# Patient Record
Sex: Female | Born: 1960
Health system: Southern US, Community
[De-identification: ages and names within clinical notes are randomized; demographics above are authoritative.]

## PROBLEM LIST (undated history)

## (undated) DIAGNOSIS — F22 Delusional disorders: Secondary | ICD-10-CM

## (undated) DIAGNOSIS — J42 Unspecified chronic bronchitis: Secondary | ICD-10-CM

## (undated) DIAGNOSIS — G4733 Obstructive sleep apnea (adult) (pediatric): Secondary | ICD-10-CM

## (undated) DIAGNOSIS — Z973 Presence of spectacles and contact lenses: Secondary | ICD-10-CM

## (undated) DIAGNOSIS — F419 Anxiety disorder, unspecified: Secondary | ICD-10-CM

## (undated) DIAGNOSIS — F319 Bipolar disorder, unspecified: Secondary | ICD-10-CM

## (undated) DIAGNOSIS — J189 Pneumonia, unspecified organism: Secondary | ICD-10-CM

## (undated) DIAGNOSIS — J9601 Acute respiratory failure with hypoxia: Secondary | ICD-10-CM

## (undated) DIAGNOSIS — Z972 Presence of dental prosthetic device (complete) (partial): Secondary | ICD-10-CM

## (undated) DIAGNOSIS — J439 Emphysema, unspecified: Secondary | ICD-10-CM

## (undated) DIAGNOSIS — H269 Unspecified cataract: Secondary | ICD-10-CM

## (undated) DIAGNOSIS — K219 Gastro-esophageal reflux disease without esophagitis: Secondary | ICD-10-CM

## (undated) DIAGNOSIS — J449 Chronic obstructive pulmonary disease, unspecified: Secondary | ICD-10-CM

## (undated) DIAGNOSIS — K436 Other and unspecified ventral hernia with obstruction, without gangrene: Secondary | ICD-10-CM

## (undated) DIAGNOSIS — Z6372 Alcoholism and drug addiction in family: Secondary | ICD-10-CM

## (undated) DIAGNOSIS — E119 Type 2 diabetes mellitus without complications: Secondary | ICD-10-CM

## (undated) DIAGNOSIS — F329 Major depressive disorder, single episode, unspecified: Secondary | ICD-10-CM

## (undated) DIAGNOSIS — M1990 Unspecified osteoarthritis, unspecified site: Secondary | ICD-10-CM

## (undated) DIAGNOSIS — J45909 Unspecified asthma, uncomplicated: Secondary | ICD-10-CM

## (undated) DIAGNOSIS — E039 Hypothyroidism, unspecified: Secondary | ICD-10-CM

## (undated) DIAGNOSIS — M545 Low back pain, unspecified: Secondary | ICD-10-CM

## (undated) DIAGNOSIS — E079 Disorder of thyroid, unspecified: Secondary | ICD-10-CM

## (undated) DIAGNOSIS — K37 Unspecified appendicitis: Secondary | ICD-10-CM

## (undated) DIAGNOSIS — R519 Headache, unspecified: Secondary | ICD-10-CM

## (undated) DIAGNOSIS — B1920 Unspecified viral hepatitis C without hepatic coma: Secondary | ICD-10-CM

## (undated) DIAGNOSIS — R51 Headache: Secondary | ICD-10-CM

## (undated) DIAGNOSIS — E7800 Pure hypercholesterolemia, unspecified: Secondary | ICD-10-CM

## (undated) DIAGNOSIS — Z9989 Dependence on other enabling machines and devices: Secondary | ICD-10-CM

## (undated) DIAGNOSIS — R000 Tachycardia, unspecified: Secondary | ICD-10-CM

## (undated) DIAGNOSIS — F32A Depression, unspecified: Secondary | ICD-10-CM

## (undated) DIAGNOSIS — G8929 Other chronic pain: Secondary | ICD-10-CM

## (undated) DIAGNOSIS — Z9981 Dependence on supplemental oxygen: Secondary | ICD-10-CM

## (undated) DIAGNOSIS — M79606 Pain in leg, unspecified: Secondary | ICD-10-CM

## (undated) DIAGNOSIS — E1140 Type 2 diabetes mellitus with diabetic neuropathy, unspecified: Secondary | ICD-10-CM

## (undated) DIAGNOSIS — I10 Essential (primary) hypertension: Secondary | ICD-10-CM

## (undated) DIAGNOSIS — R7611 Nonspecific reaction to tuberculin skin test without active tuberculosis: Secondary | ICD-10-CM

## (undated) HISTORY — PX: UPPER GI ENDOSCOPY: SHX6162

## (undated) HISTORY — DX: Alcoholism and drug addiction in family: Z63.72

## (undated) HISTORY — PX: POSTERIOR LUMBAR FUSION: SHX6036

## (undated) HISTORY — PX: TUBAL LIGATION: SHX77

## (undated) HISTORY — PX: DILATION AND CURETTAGE OF UTERUS: SHX78

## (undated) HISTORY — PX: COLONOSCOPY: SHX174

## (undated) HISTORY — PX: APPENDECTOMY: SHX54

## (undated) HISTORY — PX: NASAL SEPTUM SURGERY: SHX37

## (undated) HISTORY — DX: Essential (primary) hypertension: I10

## (undated) HISTORY — PX: LACERATION REPAIR: SHX5168

## (undated) HISTORY — PX: HERNIA REPAIR: SHX51

## (undated) HISTORY — PX: BACK SURGERY: SHX140

## (undated) HISTORY — PX: FOOT SURGERY: SHX648

## (undated) HISTORY — PX: UMBILICAL HERNIA REPAIR: SHX196

## (undated) HISTORY — PX: CYST EXCISION: SHX5701

## (undated) HISTORY — PX: CARDIAC CATHETERIZATION: SHX172

## (undated) HISTORY — PX: LIPOMA EXCISION: SHX5283

## (undated) HISTORY — PX: SINOSCOPY: SHX187

## (undated) HISTORY — PX: INCISION AND DRAINAGE: SHX5863

---

## 2013-07-27 ENCOUNTER — Emergency Department (INDEPENDENT_AMBULATORY_CARE_PROVIDER_SITE_OTHER)
Admission: EM | Admit: 2013-07-27 | Discharge: 2013-07-27 | Disposition: A | Discharge status: Home / Self Care | Payer: Medicare Other | Source: Home / Self Care

## 2013-07-27 ENCOUNTER — Encounter (HOSPITAL_COMMUNITY): Payer: Self-pay | Admitting: Emergency Medicine

## 2013-07-27 DIAGNOSIS — L578 Other skin changes due to chronic exposure to nonionizing radiation: Secondary | ICD-10-CM

## 2013-07-27 DIAGNOSIS — T148XXA Other injury of unspecified body region, initial encounter: Secondary | ICD-10-CM

## 2013-07-27 HISTORY — DX: Unspecified viral hepatitis C without hepatic coma: B19.20

## 2013-07-27 HISTORY — DX: Disorder of thyroid, unspecified: E07.9

## 2013-07-27 HISTORY — DX: Chronic obstructive pulmonary disease, unspecified: J44.9

## 2013-07-27 HISTORY — DX: Unspecified osteoarthritis, unspecified site: M19.90

## 2013-07-27 HISTORY — DX: Type 2 diabetes mellitus without complications: E11.9

## 2013-07-27 MED ORDER — SULFAMETHOXAZOLE-TMP DS 800-160 MG PO TABS
1.0000 | ORAL_TABLET | Freq: Two times a day (BID) | ORAL | Status: DC
Start: 2013-07-27 — End: 2016-09-24

## 2013-07-27 MED ORDER — MUPIROCIN 2 % EX OINT: 1.0000 "application " | TOPICAL_OINTMENT | Freq: Two times a day (BID) | CUTANEOUS | Status: DC

## 2013-07-27 NOTE — ED Notes (Signed)
Reports blister on top of right foot since 5/31.  States gradually getting bigger.  Hx DM.  Denies pain

## 2013-07-27 NOTE — ED Provider Notes (Signed)
Medical screening examination/treatment/procedure(s) were performed by a resident physician and as supervising physician I was immediately available for consultation/collaboration.  Philipp Deputy, M.D.  Harden Mo, MD 07/27/13 2217

## 2013-07-27 NOTE — Discharge Instructions (Signed)
The blister on your foot is likely from sun damage This will pop with time but let nature do this, as the blister acts as a protective layer to the skin underneath Please use the mupirocin ointment when it pops until a scab forms.  Please only use the bactrim if you develop a secondary deep skin infection.  Try to avoid over exposure to the sun
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
## 2013-07-31 ENCOUNTER — Encounter (HOSPITAL_COMMUNITY): Payer: Self-pay | Admitting: Emergency Medicine

## 2013-07-31 ENCOUNTER — Emergency Department (HOSPITAL_COMMUNITY)
Admission: EM | Admit: 2013-07-31 | Discharge: 2013-08-01 | Disposition: A | Discharge status: Home / Self Care | Payer: Medicare Other | Attending: Emergency Medicine | Admitting: Emergency Medicine

## 2013-07-31 DIAGNOSIS — J4489 Other specified chronic obstructive pulmonary disease: Secondary | ICD-10-CM | POA: Insufficient documentation

## 2013-07-31 DIAGNOSIS — X58XXXA Exposure to other specified factors, initial encounter: Secondary | ICD-10-CM | POA: Insufficient documentation

## 2013-07-31 DIAGNOSIS — E079 Disorder of thyroid, unspecified: Secondary | ICD-10-CM | POA: Insufficient documentation

## 2013-07-31 DIAGNOSIS — Z881 Allergy status to other antibiotic agents status: Secondary | ICD-10-CM | POA: Insufficient documentation

## 2013-07-31 DIAGNOSIS — Z792 Long term (current) use of antibiotics: Secondary | ICD-10-CM | POA: Insufficient documentation

## 2013-07-31 DIAGNOSIS — Y929 Unspecified place or not applicable: Secondary | ICD-10-CM | POA: Insufficient documentation

## 2013-07-31 DIAGNOSIS — IMO0002 Reserved for concepts with insufficient information to code with codable children: Secondary | ICD-10-CM | POA: Insufficient documentation

## 2013-07-31 DIAGNOSIS — Y939 Activity, unspecified: Secondary | ICD-10-CM | POA: Insufficient documentation

## 2013-07-31 DIAGNOSIS — M129 Arthropathy, unspecified: Secondary | ICD-10-CM | POA: Insufficient documentation

## 2013-07-31 DIAGNOSIS — J449 Chronic obstructive pulmonary disease, unspecified: Secondary | ICD-10-CM | POA: Insufficient documentation

## 2013-07-31 DIAGNOSIS — Z79899 Other long term (current) drug therapy: Secondary | ICD-10-CM | POA: Insufficient documentation

## 2013-07-31 DIAGNOSIS — F17200 Nicotine dependence, unspecified, uncomplicated: Secondary | ICD-10-CM | POA: Insufficient documentation

## 2013-07-31 DIAGNOSIS — E119 Type 2 diabetes mellitus without complications: Secondary | ICD-10-CM | POA: Insufficient documentation

## 2013-07-31 DIAGNOSIS — B1920 Unspecified viral hepatitis C without hepatic coma: Secondary | ICD-10-CM | POA: Insufficient documentation

## 2013-07-31 DIAGNOSIS — S90829A Blister (nonthermal), unspecified foot, initial encounter: Secondary | ICD-10-CM

## 2013-07-31 LAB — CBC WITH DIFFERENTIAL/PLATELET
Basophils Absolute: 0.1 10*3/uL (ref 0.0–0.1)
Basophils Relative: 1 % (ref 0–1)
Eosinophils Absolute: 0.4 10*3/uL (ref 0.0–0.7)
Eosinophils Relative: 3 % (ref 0–5)
HCT: 39.7 % (ref 36.0–46.0)
Hemoglobin: 13.6 g/dL (ref 12.0–15.0)
Lymphocytes Relative: 34 % (ref 12–46)
Lymphs Abs: 4 10*3/uL (ref 0.7–4.0)
MCH: 33.2 pg (ref 26.0–34.0)
MCHC: 34.3 g/dL (ref 30.0–36.0)
MCV: 96.8 fL (ref 78.0–100.0)
Monocytes Absolute: 0.6 10*3/uL (ref 0.1–1.0)
Monocytes Relative: 5 % (ref 3–12)
Neutro Abs: 6.7 10*3/uL (ref 1.7–7.7)
Neutrophils Relative %: 57 % (ref 43–77)
Platelets: 320 10*3/uL (ref 150–400)
RBC: 4.1 MIL/uL (ref 3.87–5.11)
RDW: 13.5 % (ref 11.5–15.5)
WBC: 11.8 10*3/uL — ABNORMAL HIGH (ref 4.0–10.5)

## 2013-07-31 LAB — URINALYSIS, ROUTINE W REFLEX MICROSCOPIC
Bilirubin Urine: NEGATIVE
Glucose, UA: NEGATIVE mg/dL
Hgb urine dipstick: NEGATIVE
Ketones, ur: NEGATIVE mg/dL
Leukocytes, UA: NEGATIVE
Nitrite: NEGATIVE
Protein, ur: NEGATIVE mg/dL
Specific Gravity, Urine: 1.006 (ref 1.005–1.030)
Urobilinogen, UA: 0.2 mg/dL (ref 0.0–1.0)
pH: 6.5 (ref 5.0–8.0)

## 2013-07-31 LAB — I-STAT CHEM 8, ED
BUN: 15 mg/dL (ref 6–23)
Calcium, Ion: 1.16 mmol/L (ref 1.12–1.23)
Chloride: 101 mEq/L (ref 96–112)
Creatinine, Ser: 1 mg/dL (ref 0.50–1.10)
Glucose, Bld: 95 mg/dL (ref 70–99)
HCT: 42 % (ref 36.0–46.0)
Hemoglobin: 14.3 g/dL (ref 12.0–15.0)
Potassium: 3.4 mEq/L — ABNORMAL LOW (ref 3.7–5.3)
Sodium: 145 mEq/L (ref 137–147)
TCO2: 27 mmol/L (ref 0–100)

## 2013-07-31 NOTE — ED Notes (Signed)
Pt has had a blister for several days on right foot that has increased in size.

## 2013-07-31 NOTE — ED Notes (Addendum)
C/o R anterior foot blister, 4.5cm x 5cm. Onset on 5/30. H/o recent dx of DM, "wants blister to be drained". (Denies: pain, fever, nvd). Seen at [facility], prescribed mupiricin & sulfa. Also increased urination.

## 2013-07-31 NOTE — ED Provider Notes (Signed)
CSN: [id number]     Arrival date & time 07/31/13  2126 History  This chart was scribed for non-physician practitioner working with No att. providers found by Mercy Moore, ED Scribe. This patient was seen in room TR10C/TR10C and the patient's care was started at 11:43 PM.   Chief Complaint  Patient presents with  . Blister     HPI HPI Comments: Joan [name] is a 53 y.o. female who presents to the Emergency Department complaining of blister. Blister is on the dorsunm of the R foot. She atates that it began 2 weeks ago and has gotten progressively worse. She has no other lesions. She has never had anything like this before. She denies burn or insect bite. Denies contacts with similar,changes in lotions/soaps/detergents, exposure to animal or plant irritants, and denies purulent discharge. 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 She denies pain, heat, redness, swelling or other signs of infections. She would like to have the blister "popped."  Past Medical History  Diagnosis Date  . COPD (chronic obstructive pulmonary disease)   . Diabetes mellitus without complication   . Hepatitis C   . Arthritis   . Thyroid disease    History reviewed. No pertinent past surgical history. Family History  Problem Relation Age of Onset  . Diabetes Mother   . Hypertension Mother   . Diabetes Father   . Hypertension Father   . Cancer Father     lung   History  Substance Use Topics  . Smoking status: Current Every Day Smoker -- 2.00 packs/day    Types: Cigarettes  . Smokeless tobacco: Not on file  . Alcohol Use: No   OB History   Grav Para Term Preterm Abortions TAB SAB Ect Mult Living                 Review of Systems  Constitutional: Negative for fever and chills.  Eyes: Negative for visual disturbance.  Respiratory: Negative for chest tightness and wheezing.   Cardiovascular: Negative for chest pain.  Gastrointestinal: Negative.   Endocrine: Negative for polydipsia,  polyphagia and polyuria.  Musculoskeletal: Negative.   Skin:       Blister   Neurological: Negative.   Psychiatric/Behavioral: Negative for self-injury.      Allergies  Keflex; Shellfish allergy; and Tetracyclines & related  Home Medications   Prior to Admission medications   Medication Sig Start Date End Date Taking? Authorizing Provider  atenolol (TENORMIN) 25 MG tablet Take by mouth daily.    Historical Provider, MD  esomeprazole (NEXIUM) 40 MG capsule Take 40 mg by mouth daily at 12 noon.    Historical Provider, MD  levothyroxine (SYNTHROID, LEVOTHROID) 25 MCG tablet Take 25 mcg by mouth daily before breakfast.    Historical Provider, MD  metFORMIN (GLUCOPHAGE) 500 MG tablet Take by mouth 2 (two) times daily with a meal.    Historical Provider, MD  mupirocin ointment (BACTROBAN) 2 % Place 1 application into the nose 2 (two) times daily. Apply for 3-7 days 07/27/13   Waldemar Dickens, MD  sulfamethoxazole-trimethoprim (BACTRIM DS) 800-160 MG per tablet Take 1 tablet by mouth 2 (two) times daily. 07/27/13   Waldemar Dickens, MD  valACYclovir (VALTREX) 500 MG tablet Take 500 mg by mouth 2 (two) times daily.    Historical Provider, MD  varenicline (CHANTIX) 1 MG tablet Take 1 mg by mouth 2 (two) times daily.    Historical Provider, MD  venlafaxine XR (EFFEXOR-XR) 150 MG 24 hr capsule  Take 150 mg by mouth daily with breakfast.    Historical Provider, MD   Triage Vitals: BP 158/81  Pulse 83  Temp(Src) 98.3 F (36.8 C) (Oral)  Resp 18  Wt 226 lb 6 oz (102.683 kg)  SpO2 95% Physical Exam  Constitutional: She is oriented to person, place, and time. She appears well-developed and well-nourished. No distress.  HENT:  Head: Normocephalic and atraumatic.  Eyes: Conjunctivae are normal. No scleral icterus.  Neck: Normal range of motion.  Cardiovascular: Normal rate, regular rhythm and normal heart sounds.  Exam reveals no gallop and no friction rub.   No murmur heard. Pulmonary/Chest:  Effort normal and breath sounds normal. No respiratory distress.  Abdominal: Soft. Bowel sounds are normal. She exhibits no distension and no mass. There is no tenderness. There is no guarding.  Neurological: She is alert and oriented to person, place, and time.  Skin: Skin is warm and dry. She is not diaphoretic.  Single 6 cm bulla on the dorsum of the R foot. No purulence, erythema, streaking or induration.     ED Course  Procedures (including critical care time) DIAGNOSTIC STUDIES: Oxygen Saturation is 95% on room air, normal by my interpretation.    COORDINATION OF CARE: 11:44 PM- Discussed treatment plan with patient at bedside and patient agreed to plan.     Labs Review Labs Reviewed  CBC WITH DIFFERENTIAL - Abnormal; Notable for the following:    WBC 11.8 (*)    All other components within normal limits  I-STAT CHEM 8, ED - Abnormal; Notable for the following:    Potassium 3.4 (*)    All other components within normal limits  URINALYSIS, ROUTINE W REFLEX MICROSCOPIC    Imaging Review No results found.   EKG Interpretation None      MDM   Final diagnoses:  Blister of foot without infection   Patient with single bulla of the foot. No new meds. No hives or airway involvement.  IF patient has diabetic bulla, there is no treatment and it should resolve on its own. Discussed that skin should be left intact to prevent infection. F/u with dermatologist in patient's home state.\ Patient / Family / Caregiver informed of clinical course, understand medical decision-making process, and agree with plan.  Denies contacts with similar,changes in lotions/soaps/detergents, exposure to animal or plant irritants, and denies purulent discharge.  I personally performed the services described in this documentation, which was scribed in my presence. The recorded information has been reviewed and is accurate.    Margarita Mail, PA-C 08/07/13 1252

## 2013-08-01 NOTE — Discharge Instructions (Signed)
Blisters Blisters are fluid-filled sacs that form within the skin. Common causes of blistering are friction, burns, and exposure to irritating chemicals. The fluid in the blister protects the underlying damaged skin. Most of the time it is not recommended that you open blisters. When a blister is opened, there is an increased chance for infection. Usually, a blister will open on its own. They then dry up and peel off within 10 days. If the blister is tense and uncomfortable (painful) the fluid may be drained. If it is drained the roof of the blister should be left intact. The draining should only be done by a medical professional under aseptic conditions. Poorly fitting shoes and boots can cause blisters by being too tight or too loose. Wearing extra socks or using tape, bandages, or pads over the blister-prone area helps prevent the problem by reducing friction. Blisters heal more slowly if you have diabetes or if you have problems with your circulation. You need to be careful about medical follow-up to prevent infection. HOME CARE INSTRUCTIONS  Protect areas where blisters have formed until the skin is healed. Use a special bandage with a hole cut in the middle around the blister. This reduces pressure and friction. When the blister breaks, trim off the loose skin and keep the area clean by washing it with soap daily. Soaking the blister or broken-open blister with diluted vinegar twice daily for 15 minutes will dry it up and speed the healing. Use 3 tablespoons of white vinegar per quart of water (45 mL white vinegar per liter of water). An antibiotic ointment and a bandage can be used to cover the area after soaking.  SEEK MEDICAL CARE IF:   You develop increased redness, pain, swelling, or drainage in the blistered area.  You develop a pus-like discharge from the blistered area, chills, or a fever. MAKE SURE YOU:   Understand these instructions.  Will watch your condition.  Will get help right  away if you are not doing well or get worse. Document Released: 03/21/2004 Document Revised: 05/06/2011 Document Reviewed: 02/17/2008 [facility] Patient Information 2014 [place], [place].

## 2013-08-05 ENCOUNTER — Emergency Department (INDEPENDENT_AMBULATORY_CARE_PROVIDER_SITE_OTHER)
Admission: EM | Admit: 2013-08-05 | Discharge: 2013-08-05 | Disposition: A | Discharge status: Home / Self Care | Payer: Medicare Other | Source: Home / Self Care | Attending: Emergency Medicine | Admitting: Emergency Medicine

## 2013-08-05 ENCOUNTER — Encounter (HOSPITAL_COMMUNITY): Payer: Self-pay | Admitting: Emergency Medicine

## 2013-08-05 DIAGNOSIS — J441 Chronic obstructive pulmonary disease with (acute) exacerbation: Secondary | ICD-10-CM

## 2013-08-05 DIAGNOSIS — T300 Burn of unspecified body region, unspecified degree: Secondary | ICD-10-CM

## 2013-08-05 MED ORDER — ALBUTEROL SULFATE (2.5 MG/3ML) 0.083% IN NEBU
2.5000 mg | INHALATION_SOLUTION | Freq: Once | RESPIRATORY_TRACT | Status: AC
  Administered 2013-08-05: 2.5 mg via RESPIRATORY_TRACT

## 2013-08-05 MED ORDER — IPRATROPIUM-ALBUTEROL 0.5-2.5 (3) MG/3ML IN SOLN
RESPIRATORY_TRACT | Status: AC
  Filled 2013-08-05: qty 3

## 2013-08-05 MED ORDER — METHYLPREDNISOLONE ACETATE 80 MG/ML IJ SUSP
80.0000 mg | Freq: Once | INTRAMUSCULAR | Status: AC
  Administered 2013-08-05: 80 mg via INTRAMUSCULAR

## 2013-08-05 MED ORDER — AZITHROMYCIN 250 MG PO TABS: 1000.0000 mg | ORAL_TABLET | Freq: Once | ORAL | Status: DC

## 2013-08-05 MED ORDER — METHYLPREDNISOLONE ACETATE 80 MG/ML IJ SUSP
INTRAMUSCULAR | Status: AC
  Filled 2013-08-05: qty 1

## 2013-08-05 MED ORDER — IPRATROPIUM-ALBUTEROL 0.5-2.5 (3) MG/3ML IN SOLN
3.0000 mL | Freq: Once | RESPIRATORY_TRACT | Status: AC
  Administered 2013-08-05: 3 mL via RESPIRATORY_TRACT

## 2013-08-05 MED ORDER — SILVER SULFADIAZINE 1 % EX CREA: 1.0000 "application " | TOPICAL_CREAM | Freq: Every day | CUTANEOUS | Status: DC

## 2013-08-05 MED ORDER — ALBUTEROL SULFATE (2.5 MG/3ML) 0.083% IN NEBU
INHALATION_SOLUTION | RESPIRATORY_TRACT | Status: AC
  Filled 2013-08-05: qty 3

## 2013-08-05 MED ORDER — CLINDAMYCIN HCL 300 MG PO CAPS: 300.0000 mg | ORAL_CAPSULE | Freq: Four times a day (QID) | ORAL | Status: DC
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
## 2013-08-05 NOTE — ED Notes (Signed)
C/o blister on dorsum or R foot onset 5/31.  Has soaked it in vinegar and water, taking antibiotic, and using antibiotic cream

## 2013-08-05 NOTE — Discharge Instructions (Signed)
Chronic Obstructive Pulmonary Disease Exacerbation  Chronic obstructive pulmonary disease (COPD) is a common lung problem. In COPD, the flow of air from the lungs is limited. COPD exacerbations are times that breathing gets worse and you need extra treatment. Without treatment they can be life threatening. If they happen often, your lungs can become more damaged. HOME CARE  Do not smoke.  Avoid tobacco smoke and other things that bother your lungs.  If given, take your antibiotic medicine as told. Finish the medicine even if you start to feel better.  Only take medicines as told by your doctor.  Drink enough fluids to keep your pee (urine) clear or pale yellow (unless your doctor has told you not to).  Use a cool mist machine (vaporizer).  If you use oxygen or a machine that turns liquid medicine into a mist (nebulizer), continue to use them as told.  Keep up with shots (vaccinations) as told by your doctor.  Exercise regularly.  Eat healthy foods.  Keep all doctor visits as told. GET HELP RIGHT AWAY IF:  You are very short of breath and it gets worse.  You have trouble talking.  You have bad chest pain.  You have blood in your spit (sputum).  You have a fever.  You keep throwing up (vomiting).  You feel weak, or you pass out (faint).  You feel confused.  You keep getting worse. MAKE SURE YOU:   Understand these instructions.  Will watch your condition.  Will get help right away if you are not doing well or get worse. Document Released: 01/31/2011 Document Revised: 12/02/2012 Document Reviewed: 10/16/2012 [facility] Patient Information 2014 [place]. Burn Care Your skin is a natural barrier to infection. It is the largest organ of your body. Burns damage this natural protection. To help prevent infection, it is very important to follow your caregiver's instructions in the care of your burn. Burns are classified as:  First degree. There is only redness of  the skin (erythema). No scarring is expected.  Second degree. There is blistering of the skin. Scarring may occur with deeper burns.  Third degree. All layers of the skin are injured, and scarring is expected. HOME CARE INSTRUCTIONS   Wash your hands well before changing your bandage.  Change your bandage as often as directed by your caregiver.  Remove the old bandage. If the bandage sticks, you may soak it off with cool, clean water.  Cleanse the burn thoroughly but gently with mild soap and water.  Pat the area dry with a clean, dry cloth.  Apply a thin layer of antibacterial cream to the burn.  Apply a clean bandage as instructed by your caregiver.  Keep the bandage as clean and dry as possible.  Elevate the affected area for the first 24 hours, then as instructed by your caregiver.  Only take over-the-counter or prescription medicines for pain, discomfort, or fever as directed by your caregiver. SEEK IMMEDIATE MEDICAL CARE IF:   You develop excessive pain.  You develop redness, tenderness, swelling, or red streaks near the burn.  The burned area develops yellowish-white fluid (pus) or a bad smell.  You have a fever. MAKE SURE YOU:   Understand these instructions.  Will watch your condition.  Will get help right away if you are not doing well or get worse. Document Released: 02/11/2005 Document Revised: 05/06/2011 Document Reviewed: 07/04/2010 [facility] Patient Information 2014 [place], [place].

## 2013-08-07 NOTE — ED Provider Notes (Signed)
Medical screening examination/treatment/procedure(s) were performed by non-physician practitioner and as supervising physician I was immediately available for consultation/collaboration.   EKG Interpretation None        Mariea Clonts, MD 08/07/13 2055

## 2016-08-09 ENCOUNTER — Encounter (HOSPITAL_COMMUNITY): Payer: Self-pay | Admitting: Emergency Medicine

## 2016-08-09 ENCOUNTER — Emergency Department (HOSPITAL_COMMUNITY)
Admission: EM | Admit: 2016-08-09 | Discharge: 2016-08-09 | Disposition: A | Discharge status: Home / Self Care | Payer: Medicare (Managed Care) | Attending: Emergency Medicine | Admitting: Emergency Medicine

## 2016-08-09 ENCOUNTER — Emergency Department (HOSPITAL_COMMUNITY): Payer: Medicare (Managed Care)

## 2016-08-09 ENCOUNTER — Ambulatory Visit (HOSPITAL_COMMUNITY): Admission: EM | Admit: 2016-08-09 | Discharge: 2016-08-09 | Payer: Medicare Other

## 2016-08-09 DIAGNOSIS — Z794 Long term (current) use of insulin: Secondary | ICD-10-CM | POA: Insufficient documentation

## 2016-08-09 DIAGNOSIS — B1920 Unspecified viral hepatitis C without hepatic coma: Secondary | ICD-10-CM | POA: Diagnosis not present

## 2016-08-09 DIAGNOSIS — R200 Anesthesia of skin: Secondary | ICD-10-CM | POA: Insufficient documentation

## 2016-08-09 DIAGNOSIS — F17210 Nicotine dependence, cigarettes, uncomplicated: Secondary | ICD-10-CM | POA: Insufficient documentation

## 2016-08-09 DIAGNOSIS — J441 Chronic obstructive pulmonary disease with (acute) exacerbation: Secondary | ICD-10-CM

## 2016-08-09 DIAGNOSIS — E119 Type 2 diabetes mellitus without complications: Secondary | ICD-10-CM | POA: Diagnosis not present

## 2016-08-09 DIAGNOSIS — Z79899 Other long term (current) drug therapy: Secondary | ICD-10-CM | POA: Insufficient documentation

## 2016-08-09 DIAGNOSIS — R0602 Shortness of breath: Secondary | ICD-10-CM | POA: Insufficient documentation

## 2016-08-09 LAB — CBC
HCT: 40.9 % (ref 36.0–46.0)
Hemoglobin: 13.6 g/dL (ref 12.0–15.0)
MCH: 32.5 pg (ref 26.0–34.0)
MCHC: 33.3 g/dL (ref 30.0–36.0)
MCV: 97.8 fL (ref 78.0–100.0)
Platelets: 329 10*3/uL (ref 150–400)
RBC: 4.18 MIL/uL (ref 3.87–5.11)
RDW: 13.7 % (ref 11.5–15.5)
WBC: 8.5 10*3/uL (ref 4.0–10.5)

## 2016-08-09 LAB — I-STAT CHEM 8, ED
BUN: 6 mg/dL (ref 6–20)
Calcium, Ion: 1.19 mmol/L (ref 1.15–1.40)
Chloride: 105 mmol/L (ref 101–111)
Creatinine, Ser: 0.9 mg/dL (ref 0.44–1.00)
Glucose, Bld: 140 mg/dL — ABNORMAL HIGH (ref 65–99)
HCT: 40 % (ref 36.0–46.0)
Hemoglobin: 13.6 g/dL (ref 12.0–15.0)
Potassium: 4.1 mmol/L (ref 3.5–5.1)
Sodium: 140 mmol/L (ref 135–145)
TCO2: 25 mmol/L (ref 0–100)

## 2016-08-09 LAB — DIFFERENTIAL
Basophils Absolute: 0 10*3/uL (ref 0.0–0.1)
Basophils Relative: 1 %
Eosinophils Absolute: 0.2 10*3/uL (ref 0.0–0.7)
Eosinophils Relative: 3 %
Lymphocytes Relative: 28 %
Lymphs Abs: 2.4 10*3/uL (ref 0.7–4.0)
Monocytes Absolute: 0.7 10*3/uL (ref 0.1–1.0)
Monocytes Relative: 8 %
Neutro Abs: 5.1 10*3/uL (ref 1.7–7.7)
Neutrophils Relative %: 60 %

## 2016-08-09 LAB — PROTIME-INR
INR: 1.1
Prothrombin Time: 14.2 seconds (ref 11.4–15.2)

## 2016-08-09 LAB — APTT: aPTT: 29 seconds (ref 24–36)

## 2016-08-09 LAB — COMPREHENSIVE METABOLIC PANEL
ALT: 19 U/L (ref 14–54)
AST: 16 U/L (ref 15–41)
Albumin: 3.6 g/dL (ref 3.5–5.0)
Alkaline Phosphatase: 107 U/L (ref 38–126)
Anion gap: 9 (ref 5–15)
BUN: 6 mg/dL (ref 6–20)
CO2: 23 mmol/L (ref 22–32)
Calcium: 9.2 mg/dL (ref 8.9–10.3)
Chloride: 106 mmol/L (ref 101–111)
Creatinine, Ser: 1.02 mg/dL — ABNORMAL HIGH (ref 0.44–1.00)
GFR calc Af Amer: >60 mL/min (ref >60)
GFR calc non Af Amer: >60 mL/min (ref >60)
Glucose, Bld: 142 mg/dL — ABNORMAL HIGH (ref 65–99)
Potassium: 4.2 mmol/L (ref 3.5–5.1)
Sodium: 138 mmol/L (ref 135–145)
Total Bilirubin: 0.6 mg/dL (ref 0.3–1.2)
Total Protein: 6.7 g/dL (ref 6.5–8.1)

## 2016-08-09 LAB — I-STAT TROPONIN, ED: Troponin i, poc: 0.01 ng/mL (ref 0.00–0.08)

## 2016-08-09 MED ORDER — IPRATROPIUM-ALBUTEROL 0.5-2.5 (3) MG/3ML IN SOLN
3.0000 mL | Freq: Once | RESPIRATORY_TRACT | Status: AC
  Administered 2016-08-09: 3 mL via RESPIRATORY_TRACT
  Filled 2016-08-09: qty 3

## 2016-08-09 MED ORDER — PREDNISONE 10 MG PO TABS: 40.0000 mg | ORAL_TABLET | Freq: Every day | ORAL | 0 refills | Status: DC

## 2016-08-09 MED ORDER — ALBUTEROL SULFATE (2.5 MG/3ML) 0.083% IN NEBU
5.0000 mg | INHALATION_SOLUTION | Freq: Once | RESPIRATORY_TRACT | Status: AC
  Administered 2016-08-09: 5 mg via RESPIRATORY_TRACT
  Filled 2016-08-09: qty 6

## 2016-08-09 MED ORDER — METHYLPREDNISOLONE SODIUM SUCC 125 MG IJ SOLR
125.0000 mg | Freq: Once | INTRAMUSCULAR | Status: AC
  Administered 2016-08-09: 125 mg via INTRAVENOUS
  Filled 2016-08-09: qty 2

## 2016-08-09 NOTE — ED Triage Notes (Signed)
Arrived in [place] 2 days ago and was seen in hospital in [place] and discharged June 11th. States continued to have symptoms of shortness of breath, left arm and left leg numbness and dizziness. States symptoms worsening overtime.  Alert answering and following commands appropriate.

## 2016-08-09 NOTE — ED Notes (Signed)
Contact # [phone] : brother - [name]

## 2016-08-09 NOTE — Discharge Instructions (Signed)
Continue to use albuterol inhaler 2 puffs every 6 hours for the next 7 days. Take prednisone short course for the next 5 days. Make an appointment to follow-up with the wellness clinic. Identifying a primary care doctor locally will be important if you decide stay in the area. Return for any new or worse symptoms.

## 2016-08-09 NOTE — ED Provider Notes (Signed)
[facility] DEPT Provider Note   CSN: [id number] Arrival date & time: 08/09/16  1107     History   Chief Complaint Chief Complaint  Patient presents with  . Shortness of Breath  . Numbness    HPI Joan [name] is a 56 y.o. female.  Patient presents with shortness of breath. Patient is known COPD. Patient apparently with a prolonged admission from May until June 11 in [place] which included some rehabilitation time. Patient was discharged June 11. Patient traveled here because of family members. Patient states that the shortness of breath has been getting worse. Also with a complaint of left arm left leg numbness. In triage complained of dizziness but didn't complain of dizziness to me. Patient oxygen saturations on arrival was 9697 but did have bilateral wheezing.      Past Medical History:  Diagnosis Date  . Arthritis   . COPD (chronic obstructive pulmonary disease) ([place])   . Diabetes mellitus without complication ([place])   . Hepatitis C   . Thyroid disease     There are no active problems to display for this patient.   Past Surgical History:  Procedure Laterality Date  . COLONOSCOPY    . FOOT SURGERY Bilateral    bone removed from 4th and 5 th toes and put in pins  . HERNIA REPAIR     umbilical  . NASAL SEPTUM SURGERY     deviated septum and removal of cyst in sinuses  . NECK SURGERY Right    fattty lipoma   . UPPER GI ENDOSCOPY    . WRIST SURGERY Right    repair artery from laceration from ice skate    OB History    No data available       Home Medications    Prior to Admission medications   Medication Sig Start Date End Date Taking? Authorizing Provider  albuterol (PROVENTIL) (2.5 MG/3ML) 0.083% nebulizer solution Take 3 mLs by nebulization every 6 (six) hours as needed for wheezing. 08/05/16  Yes [provider]  atenolol (TENORMIN) 25 MG tablet Take by mouth daily.   Yes [provider]  atorvastatin (LIPITOR) 20 MG tablet Take 20  mg by mouth daily.   Yes [provider]  baclofen (LIORESAL) 10 MG tablet Take 10 mg by mouth at bedtime.   Yes [provider]  budesonide (PULMICORT) 1 MG/2ML nebulizer solution Take 1 mg by nebulization 4 (four) times daily.   Yes [provider]  formoterol (PERFOROMIST) 20 MCG/2ML nebulizer solution Take 20 mcg by nebulization 2 (two) times daily.   Yes [provider]  Insulin Glargine (LANTUS SOLOSTAR) 100 UNIT/ML Solostar Pen Inject 18 Units into the skin daily.   Yes [provider]  insulin lispro (HUMALOG KWIKPEN) 100 UNIT/ML KiwkPen Inject 7 Units into the skin 3 (three) times daily.   Yes [provider]  levothyroxine (SYNTHROID, LEVOTHROID) 25 MCG tablet Take 25 mcg by mouth daily before breakfast.   Yes [provider]  lisinopril (PRINIVIL,ZESTRIL) 10 MG tablet Take 10 mg by mouth daily.   Yes [provider]  montelukast (SINGULAIR) 10 MG tablet Take 10 mg by mouth at bedtime.   Yes [provider]  roflumilast (DALIRESP) 500 MCG TABS tablet Take 500 mcg by mouth daily.   Yes [provider]  sertraline (ZOLOFT) 50 MG tablet Take 50 mg by mouth daily.   Yes [provider]  tiotropium (SPIRIVA) 18 MCG inhalation capsule Place 18 mcg into inhaler and inhale  daily.   Yes [provider]  valACYclovir (VALTREX) 500 MG tablet Take 500 mg by mouth daily.    Yes [provider]  venlafaxine XR (EFFEXOR-XR) 150 MG 24 hr capsule Take 150 mg by mouth daily with breakfast.   Yes [provider]  clindamycin (CLEOCIN) 300 MG capsule Take 1 capsule (300 mg total) by mouth 4 (four) times daily. Patient not taking: Reported on 08/09/2016 08/05/13   Harden Mo, MD  mupirocin ointment (BACTROBAN) 2 % Place 1 application into the nose 2 (two) times daily. Apply for 3-7 days Patient not taking: Reported on 08/09/2016 07/27/13   Waldemar Dickens, MD  predniSONE (DELTASONE)  10 MG tablet Take 4 tablets (40 mg total) by mouth daily. 08/09/16   Fredia Sorrow, MD  silver sulfADIAZINE (SILVADENE) 1 % cream Apply 1 application topically daily. Patient not taking: Reported on 08/09/2016 08/05/13   Harden Mo, MD  sulfamethoxazole-trimethoprim (BACTRIM DS) 800-160 MG per tablet Take 1 tablet by mouth 2 (two) times daily. Patient not taking: Reported on 08/09/2016 07/27/13   Waldemar Dickens, MD    Family History Family History  Problem Relation Age of Onset  . Diabetes Mother   . Hypertension Mother   . Hypertension Father   . Cancer Father        lung    Social History Social History  Substance Use Topics  . Smoking status: Current Every Day Smoker    Packs/day: 1.50    Types: Cigarettes  . Smokeless tobacco: Never Used  . Alcohol use No     Allergies   Dilaudid [hydromorphone hcl]; Iodine; Keflex [cephalexin]; Prednisone; Shellfish allergy; and Tetracyclines & related   Review of Systems Review of Systems  Constitutional: Negative for fever.  HENT: Negative for congestion.   Eyes: Negative for redness.  Respiratory: Positive for shortness of breath and wheezing.   Gastrointestinal: Negative for abdominal pain.  Genitourinary: Negative for dysuria.  Musculoskeletal: Negative for back pain.  Skin: Negative for rash.  Neurological: Positive for numbness.  Hematological: Does not bruise/bleed easily.  Psychiatric/Behavioral: Negative for confusion.     Physical Exam Updated Vital Signs BP 139/77   Pulse 95   Temp 97.9 F (36.6 C)   Resp (!) 21   Ht 1.727 m (5\' 8" )   Wt 90.7 kg (200 lb)   SpO2 95%   BMI 30.41 kg/m   Physical Exam  Constitutional: She is oriented to person, place, and time. She appears well-developed and well-nourished. No distress.  HENT:  Head: Normocephalic and atraumatic.  Mouth/Throat: Oropharynx is clear and moist.  Eyes: Conjunctivae and EOM are normal. Pupils are equal, round, and reactive to light.    Neck: Normal range of motion. Neck supple.  Cardiovascular: Normal rate, regular rhythm and normal heart sounds.   Pulmonary/Chest: Effort normal. She has wheezes.  Abdominal: Soft. Bowel sounds are normal. There is no tenderness.  Musculoskeletal: Normal range of motion.  Left radial pulse 2+.  Neurological: She is alert and oriented to person, place, and time. No cranial nerve deficit or sensory deficit. She exhibits normal muscle tone. Coordination normal.  With the exception that patient states there is numbness to the left arm.  Nursing note and vitals reviewed.    ED Treatments / Results  Labs (all labs ordered are listed, but only abnormal results are displayed) Labs Reviewed  COMPREHENSIVE METABOLIC PANEL - Abnormal; Notable for the following:       Result Value  Glucose, Bld 142 (*)    Creatinine, Ser 1.02 (*)    All other components within normal limits  I-STAT CHEM 8, ED - Abnormal; Notable for the following:    Glucose, Bld 140 (*)    All other components within normal limits  PROTIME-INR  APTT  CBC  DIFFERENTIAL  I-STAT TROPOININ, ED  CBG MONITORING, ED    EKG  EKG Interpretation  Date/Time:  Friday August 09 2016 11:17:55 EDT Ventricular Rate:  110 PR Interval:  146 QRS Duration: 88 QT Interval:  352 QTC Calculation: 476 R Axis:   84 Text Interpretation:  Sinus tachycardia Otherwise normal ECG No previous ECGs available Confirmed by Fredia Sorrow [phone]) on 08/09/2016 1:16:46 PM       Radiology Dg Chest 2 View  Result Date: 08/09/2016 CLINICAL DATA:  Cough. EXAM: CHEST  2 VIEW COMPARISON:  None. FINDINGS: The heart size and mediastinal contours are within normal limits. Both lungs are clear. No pneumothorax or pleural effusion is noted. Prominent epicardial fat pad is seen anteriorly in the left lung base. The visualized skeletal structures are unremarkable. IMPRESSION: No active cardiopulmonary disease. Electronically Signed   By: Marijo Conception,  M.D.   On: 08/09/2016 13:01   Ct Head Wo Contrast  Result Date: 08/09/2016 CLINICAL DATA:  56 year old female with left arm numbness and heaviness. EXAM: CT HEAD WITHOUT CONTRAST TECHNIQUE: Contiguous axial images were obtained from the base of the skull through the vertex without intravenous contrast. COMPARISON:  None. FINDINGS: Brain: Cerebral volume is normal. No midline shift, ventriculomegaly, mass effect, evidence of mass lesion, intracranial hemorrhage or evidence of cortically based acute infarction. Gray-white matter differentiation is within normal limits throughout the brain. Vascular: Mild Calcified atherosclerosis at the skull base. No suspicious intracranial vascular hyperdensity. Skull: Negative.  No acute osseous abnormality identified. Sinuses/Orbits: Bubbly opacity and mucosal thickening in the left maxillary sinus. Other paranasal sinuses and mastoids are well pneumatized. Other: Visualized orbits and scalp soft tissues are within normal limits. IMPRESSION: Normal noncontrast CT appearance of the brain. Electronically Signed   By: Genevie Ann M.D.   On: 08/09/2016 13:40    Procedures Procedures (including critical care time)  Medications Ordered in ED Medications  albuterol (PROVENTIL) (2.5 MG/3ML) 0.083% nebulizer solution 5 mg (5 mg Nebulization Given 08/09/16 1309)  methylPREDNISolone sodium succinate (SOLU-MEDROL) 125 mg/2 mL injection 125 mg (125 mg Intravenous Given 08/09/16 1501)  ipratropium-albuterol (DUONEB) 0.5-2.5 (3) MG/3ML nebulizer solution 3 mL (3 mLs Nebulization Given 08/09/16 1503)     Initial Impression / Assessment and Plan / ED Course  I have reviewed the triage vital signs and the nursing notes.  Pertinent labs & imaging results that were available during my care of the patient were reviewed by me and considered in my medical decision making (see chart for details).     Patient with known COPD. Patient arrived in [place] 2 days ago. Patient was  hospitalized in [place] and then rehabilitation from mild mid May until June 11. Patient apparently has family in this area. Patient with shortness of breath. Also with the complaint of left arm and left leg numbness that's been present for weeks is not new. Patient definitely with wheezing here consistent with an exacerbation of her COPD. Basic labs chest x-ray without any acute findings. Patient received 2 nebulizer treatments here and Solu-Medrol with resolution of the wheezing. Oxygen saturations have been mid 90s are upper 90s. Patient with significant improvement. Patient thinks she may be staying in  the area. Given referral to wellness clinic will be continued on prednisone for the next 5 days. Patient has an "allergy" to prednisone but it just makes her swell she is able to take it. We'll do a short burst course.  Head CT also without any acute findings. In the left arm symptoms been present for many weeks so no evidence of stroke based on head CT. Further follow-up as an outpatient would be required.   Final Clinical Impressions(s) / ED Diagnoses   Final diagnoses:  COPD exacerbation (HCC)    New Prescriptions New Prescriptions   PREDNISONE (DELTASONE) 10 MG TABLET    Take 4 tablets (40 mg total) by mouth daily.     Fredia Sorrow, MD 08/09/16 [phone]

## 2016-09-17 DIAGNOSIS — R69 Illness, unspecified: Secondary | ICD-10-CM | POA: Diagnosis not present

## 2016-09-21 DIAGNOSIS — E119 Type 2 diabetes mellitus without complications: Secondary | ICD-10-CM | POA: Diagnosis not present

## 2016-09-24 ENCOUNTER — Encounter (HOSPITAL_COMMUNITY): Payer: Self-pay | Admitting: Emergency Medicine

## 2016-09-24 ENCOUNTER — Emergency Department (HOSPITAL_COMMUNITY): Payer: Medicare HMO

## 2016-09-24 ENCOUNTER — Inpatient Hospital Stay (HOSPITAL_COMMUNITY)
Admission: EM | Admit: 2016-09-24 | Discharge: 2016-09-30 | DRG: 190 | Disposition: A | Discharge status: Home Health | Payer: Medicare HMO | Attending: Internal Medicine | Admitting: Internal Medicine

## 2016-09-24 DIAGNOSIS — G4733 Obstructive sleep apnea (adult) (pediatric): Secondary | ICD-10-CM | POA: Diagnosis present

## 2016-09-24 DIAGNOSIS — E7800 Pure hypercholesterolemia, unspecified: Secondary | ICD-10-CM | POA: Diagnosis present

## 2016-09-24 DIAGNOSIS — J9601 Acute respiratory failure with hypoxia: Secondary | ICD-10-CM | POA: Diagnosis not present

## 2016-09-24 DIAGNOSIS — F17200 Nicotine dependence, unspecified, uncomplicated: Secondary | ICD-10-CM | POA: Diagnosis present

## 2016-09-24 DIAGNOSIS — I10 Essential (primary) hypertension: Secondary | ICD-10-CM | POA: Diagnosis present

## 2016-09-24 DIAGNOSIS — G8929 Other chronic pain: Secondary | ICD-10-CM | POA: Diagnosis present

## 2016-09-24 DIAGNOSIS — F419 Anxiety disorder, unspecified: Secondary | ICD-10-CM | POA: Diagnosis present

## 2016-09-24 DIAGNOSIS — I6529 Occlusion and stenosis of unspecified carotid artery: Secondary | ICD-10-CM | POA: Diagnosis present

## 2016-09-24 DIAGNOSIS — F411 Generalized anxiety disorder: Secondary | ICD-10-CM | POA: Diagnosis present

## 2016-09-24 DIAGNOSIS — T380X5A Adverse effect of glucocorticoids and synthetic analogues, initial encounter: Secondary | ICD-10-CM | POA: Diagnosis present

## 2016-09-24 DIAGNOSIS — J9622 Acute and chronic respiratory failure with hypercapnia: Secondary | ICD-10-CM | POA: Diagnosis present

## 2016-09-24 DIAGNOSIS — Z720 Tobacco use: Secondary | ICD-10-CM | POA: Diagnosis not present

## 2016-09-24 DIAGNOSIS — J441 Chronic obstructive pulmonary disease with (acute) exacerbation: Principal | ICD-10-CM | POA: Diagnosis present

## 2016-09-24 DIAGNOSIS — D72829 Elevated white blood cell count, unspecified: Secondary | ICD-10-CM | POA: Diagnosis not present

## 2016-09-24 DIAGNOSIS — F319 Bipolar disorder, unspecified: Secondary | ICD-10-CM | POA: Diagnosis present

## 2016-09-24 DIAGNOSIS — E1165 Type 2 diabetes mellitus with hyperglycemia: Secondary | ICD-10-CM | POA: Diagnosis present

## 2016-09-24 DIAGNOSIS — E099 Drug or chemical induced diabetes mellitus without complications: Secondary | ICD-10-CM

## 2016-09-24 DIAGNOSIS — J383 Other diseases of vocal cords: Secondary | ICD-10-CM

## 2016-09-24 DIAGNOSIS — M79604 Pain in right leg: Secondary | ICD-10-CM | POA: Diagnosis present

## 2016-09-24 DIAGNOSIS — M545 Low back pain: Secondary | ICD-10-CM | POA: Diagnosis present

## 2016-09-24 DIAGNOSIS — J9621 Acute and chronic respiratory failure with hypoxia: Secondary | ICD-10-CM | POA: Diagnosis present

## 2016-09-24 DIAGNOSIS — Z7951 Long term (current) use of inhaled steroids: Secondary | ICD-10-CM

## 2016-09-24 DIAGNOSIS — Z8701 Personal history of pneumonia (recurrent): Secondary | ICD-10-CM

## 2016-09-24 DIAGNOSIS — R062 Wheezing: Secondary | ICD-10-CM

## 2016-09-24 DIAGNOSIS — R1031 Right lower quadrant pain: Secondary | ICD-10-CM | POA: Diagnosis present

## 2016-09-24 DIAGNOSIS — Z8249 Family history of ischemic heart disease and other diseases of the circulatory system: Secondary | ICD-10-CM

## 2016-09-24 DIAGNOSIS — R69 Illness, unspecified: Secondary | ICD-10-CM | POA: Diagnosis not present

## 2016-09-24 DIAGNOSIS — E039 Hypothyroidism, unspecified: Secondary | ICD-10-CM | POA: Diagnosis present

## 2016-09-24 DIAGNOSIS — Z888 Allergy status to other drugs, medicaments and biological substances status: Secondary | ICD-10-CM

## 2016-09-24 DIAGNOSIS — Z833 Family history of diabetes mellitus: Secondary | ICD-10-CM

## 2016-09-24 DIAGNOSIS — Z881 Allergy status to other antibiotic agents status: Secondary | ICD-10-CM

## 2016-09-24 DIAGNOSIS — Z91013 Allergy to seafood: Secondary | ICD-10-CM

## 2016-09-24 DIAGNOSIS — Z981 Arthrodesis status: Secondary | ICD-10-CM

## 2016-09-24 DIAGNOSIS — J449 Chronic obstructive pulmonary disease, unspecified: Secondary | ICD-10-CM | POA: Diagnosis present

## 2016-09-24 DIAGNOSIS — R0602 Shortness of breath: Secondary | ICD-10-CM | POA: Diagnosis not present

## 2016-09-24 DIAGNOSIS — R000 Tachycardia, unspecified: Secondary | ICD-10-CM | POA: Diagnosis present

## 2016-09-24 DIAGNOSIS — Z885 Allergy status to narcotic agent status: Secondary | ICD-10-CM

## 2016-09-24 DIAGNOSIS — M1990 Unspecified osteoarthritis, unspecified site: Secondary | ICD-10-CM | POA: Diagnosis present

## 2016-09-24 DIAGNOSIS — Z79899 Other long term (current) drug therapy: Secondary | ICD-10-CM

## 2016-09-24 DIAGNOSIS — F17210 Nicotine dependence, cigarettes, uncomplicated: Secondary | ICD-10-CM | POA: Diagnosis present

## 2016-09-24 HISTORY — DX: Unspecified chronic bronchitis: J42

## 2016-09-24 HISTORY — DX: Unspecified asthma, uncomplicated: J45.909

## 2016-09-24 HISTORY — DX: Obstructive sleep apnea (adult) (pediatric): G47.33

## 2016-09-24 HISTORY — DX: Pneumonia, unspecified organism: J18.9

## 2016-09-24 HISTORY — DX: Low back pain: M54.5

## 2016-09-24 HISTORY — DX: Anxiety disorder, unspecified: F41.9

## 2016-09-24 HISTORY — DX: Pain in leg, unspecified: M79.606

## 2016-09-24 HISTORY — DX: Bipolar disorder, unspecified: F31.9

## 2016-09-24 HISTORY — DX: Emphysema, unspecified: J43.9

## 2016-09-24 HISTORY — DX: Pure hypercholesterolemia, unspecified: E78.00

## 2016-09-24 HISTORY — DX: Dependence on other enabling machines and devices: Z99.89

## 2016-09-24 HISTORY — DX: Depression, unspecified: F32.A

## 2016-09-24 HISTORY — DX: Low back pain, unspecified: M54.50

## 2016-09-24 HISTORY — DX: Major depressive disorder, single episode, unspecified: F32.9

## 2016-09-24 HISTORY — DX: Other chronic pain: G89.29

## 2016-09-24 HISTORY — DX: Hypothyroidism, unspecified: E03.9

## 2016-09-24 LAB — CBG MONITORING, ED: Glucose-Capillary: 200 mg/dL — ABNORMAL HIGH (ref 65–99)

## 2016-09-24 LAB — I-STAT VENOUS BLOOD GAS, ED
Acid-Base Excess: 9 mmol/L — ABNORMAL HIGH (ref 0.0–2.0)
Bicarbonate: 34.8 mmol/L — ABNORMAL HIGH (ref 20.0–28.0)
O2 Saturation: 82 %
TCO2: 36 mmol/L (ref 0–100)
pCO2, Ven: 52.7 mmHg (ref 44.0–60.0)
pH, Ven: 7.428 (ref 7.250–7.430)
pO2, Ven: 46 mmHg — ABNORMAL HIGH (ref 32.0–45.0)

## 2016-09-24 LAB — I-STAT TROPONIN, ED: Troponin i, poc: 0 ng/mL (ref 0.00–0.08)

## 2016-09-24 LAB — BASIC METABOLIC PANEL
Anion gap: 7 (ref 5–15)
BUN: 14 mg/dL (ref 6–20)
CO2: 28 mmol/L (ref 22–32)
Calcium: 9.2 mg/dL (ref 8.9–10.3)
Chloride: 107 mmol/L (ref 101–111)
Creatinine, Ser: 0.88 mg/dL (ref 0.44–1.00)
GFR calc Af Amer: >60 mL/min (ref >60)
GFR calc non Af Amer: >60 mL/min (ref >60)
Glucose, Bld: 105 mg/dL — ABNORMAL HIGH (ref 65–99)
Potassium: 4.3 mmol/L (ref 3.5–5.1)
Sodium: 142 mmol/L (ref 135–145)

## 2016-09-24 LAB — CBC
HCT: 42.3 % (ref 36.0–46.0)
Hemoglobin: 14.4 g/dL (ref 12.0–15.0)
MCH: 33 pg (ref 26.0–34.0)
MCHC: 34 g/dL (ref 30.0–36.0)
MCV: 97 fL (ref 78.0–100.0)
Platelets: 280 10*3/uL (ref 150–400)
RBC: 4.36 MIL/uL (ref 3.87–5.11)
RDW: 13.4 % (ref 11.5–15.5)
WBC: 12.5 10*3/uL — ABNORMAL HIGH (ref 4.0–10.5)

## 2016-09-24 MED ORDER — ALBUTEROL (5 MG/ML) CONTINUOUS INHALATION SOLN
7.5000 mg/h | INHALATION_SOLUTION | RESPIRATORY_TRACT | Status: DC
  Administered 2016-09-24: 7.5 mg/h via RESPIRATORY_TRACT
  Filled 2016-09-24: qty 20

## 2016-09-24 MED ORDER — MAGNESIUM SULFATE 2 GM/50ML IV SOLN
2.0000 g | Freq: Once | INTRAVENOUS | Status: AC
  Administered 2016-09-24: 2 g via INTRAVENOUS
  Filled 2016-09-24: qty 50

## 2016-09-24 MED ORDER — BUDESONIDE 0.25 MG/2ML IN SUSP
1.0000 mg | Freq: Four times a day (QID) | RESPIRATORY_TRACT | Status: DC
  Administered 2016-09-24: 1 mg via RESPIRATORY_TRACT
  Filled 2016-09-24 (×2): qty 8

## 2016-09-24 MED ORDER — SODIUM CHLORIDE 0.9 % IV SOLN
INTRAVENOUS | Status: DC
  Administered 2016-09-24: 10 mL/h via INTRAVENOUS

## 2016-09-24 MED ORDER — LORAZEPAM 2 MG/ML IJ SOLN
0.5000 mg | Freq: Three times a day (TID) | INTRAMUSCULAR | Status: AC | PRN
  Administered 2016-09-25 (×2): 0.5 mg via INTRAVENOUS
  Filled 2016-09-24 (×2): qty 1

## 2016-09-24 MED ORDER — METHYLPREDNISOLONE SODIUM SUCC 125 MG IJ SOLR
125.0000 mg | Freq: Once | INTRAMUSCULAR | Status: AC
  Administered 2016-09-24: 125 mg via INTRAVENOUS
  Filled 2016-09-24: qty 2

## 2016-09-24 MED ORDER — LORAZEPAM 2 MG/ML IJ SOLN
0.5000 mg | Freq: Once | INTRAMUSCULAR | Status: AC
  Administered 2016-09-24: 0.5 mg via INTRAVENOUS
  Filled 2016-09-24: qty 1

## 2016-09-24 MED ORDER — PREGABALIN 75 MG PO CAPS
75.0000 mg | ORAL_CAPSULE | Freq: Two times a day (BID) | ORAL | Status: DC
  Administered 2016-09-24 – 2016-09-30 (×12): 75 mg via ORAL
  Filled 2016-09-24 (×8): qty 3
  Filled 2016-09-24: qty 1
  Filled 2016-09-24 (×2): qty 3
  Filled 2016-09-24: qty 1

## 2016-09-24 MED ORDER — DIPHENHYDRAMINE HCL 25 MG PO CAPS
25.0000 mg | ORAL_CAPSULE | Freq: Every evening | ORAL | Status: DC | PRN
  Administered 2016-09-24 – 2016-09-29 (×4): 25 mg via ORAL
  Filled 2016-09-24 (×5): qty 1

## 2016-09-24 MED ORDER — VALACYCLOVIR HCL 500 MG PO TABS
500.0000 mg | ORAL_TABLET | Freq: Every day | ORAL | Status: DC
  Administered 2016-09-24 – 2016-09-30 (×7): 500 mg via ORAL
  Filled 2016-09-24 (×8): qty 1

## 2016-09-24 MED ORDER — ATORVASTATIN CALCIUM 20 MG PO TABS
20.0000 mg | ORAL_TABLET | Freq: Every day | ORAL | Status: DC
  Administered 2016-09-24 – 2016-09-30 (×7): 20 mg via ORAL
  Filled 2016-09-24 (×8): qty 1

## 2016-09-24 MED ORDER — LEVOTHYROXINE SODIUM 25 MCG PO TABS
25.0000 ug | ORAL_TABLET | Freq: Every day | ORAL | Status: DC
  Administered 2016-09-25 – 2016-09-26 (×2): 25 ug via ORAL
  Filled 2016-09-24 (×3): qty 1

## 2016-09-24 MED ORDER — ONDANSETRON HCL 4 MG PO TABS: 4.0000 mg | ORAL_TABLET | Freq: Four times a day (QID) | ORAL | Status: DC | PRN

## 2016-09-24 MED ORDER — NICOTINE 21 MG/24HR TD PT24
21.0000 mg | MEDICATED_PATCH | Freq: Every day | TRANSDERMAL | Status: DC
  Administered 2016-09-25 – 2016-09-30 (×6): 21 mg via TRANSDERMAL
  Filled 2016-09-24 (×6): qty 1

## 2016-09-24 MED ORDER — SODIUM CHLORIDE 0.9% FLUSH
3.0000 mL | Freq: Two times a day (BID) | INTRAVENOUS | Status: DC
  Administered 2016-09-24 – 2016-09-30 (×11): 3 mL via INTRAVENOUS

## 2016-09-24 MED ORDER — SODIUM CHLORIDE 0.9 % IV BOLUS (SEPSIS)
1000.0000 mL | Freq: Once | INTRAVENOUS | Status: AC
  Administered 2016-09-24: 1000 mL via INTRAVENOUS

## 2016-09-24 MED ORDER — ACETAMINOPHEN 325 MG PO TABS
650.0000 mg | ORAL_TABLET | Freq: Four times a day (QID) | ORAL | Status: DC | PRN
  Administered 2016-09-27 – 2016-09-30 (×4): 650 mg via ORAL
  Filled 2016-09-24 (×4): qty 2

## 2016-09-24 MED ORDER — INSULIN ASPART 100 UNIT/ML ~~LOC~~ SOLN
0.0000 [IU] | Freq: Three times a day (TID) | SUBCUTANEOUS | Status: DC
  Administered 2016-09-24: 3 [IU] via SUBCUTANEOUS
  Filled 2016-09-24: qty 1

## 2016-09-24 MED ORDER — IPRATROPIUM-ALBUTEROL 0.5-2.5 (3) MG/3ML IN SOLN
3.0000 mL | Freq: Four times a day (QID) | RESPIRATORY_TRACT | Status: DC
  Administered 2016-09-24 – 2016-09-25 (×2): 3 mL via RESPIRATORY_TRACT
  Filled 2016-09-24 (×2): qty 3

## 2016-09-24 MED ORDER — ACETAMINOPHEN 650 MG RE SUPP: 650.0000 mg | Freq: Four times a day (QID) | RECTAL | Status: DC | PRN

## 2016-09-24 MED ORDER — IPRATROPIUM-ALBUTEROL 0.5-2.5 (3) MG/3ML IN SOLN
3.0000 mL | Freq: Once | RESPIRATORY_TRACT | Status: AC
Start: 2016-09-24 — End: 2016-09-24
  Administered 2016-09-24: 3 mL via RESPIRATORY_TRACT

## 2016-09-24 MED ORDER — ROFLUMILAST 500 MCG PO TABS
500.0000 ug | ORAL_TABLET | Freq: Every day | ORAL | Status: DC
  Administered 2016-09-24 – 2016-09-30 (×6): 500 ug via ORAL
  Filled 2016-09-24 (×8): qty 1

## 2016-09-24 MED ORDER — ALBUTEROL SULFATE (2.5 MG/3ML) 0.083% IN NEBU: 5.0000 mg | INHALATION_SOLUTION | Freq: Once | RESPIRATORY_TRACT | Status: DC

## 2016-09-24 MED ORDER — AZITHROMYCIN 500 MG PO TABS
250.0000 mg | ORAL_TABLET | ORAL | Status: DC
  Administered 2016-09-25 – 2016-09-26 (×2): 250 mg via ORAL
  Filled 2016-09-24 (×2): qty 1

## 2016-09-24 MED ORDER — HYDRALAZINE HCL 20 MG/ML IJ SOLN
10.0000 mg | Freq: Four times a day (QID) | INTRAMUSCULAR | Status: DC | PRN
  Administered 2016-09-25 – 2016-09-26 (×2): 10 mg via INTRAVENOUS
  Filled 2016-09-24 (×2): qty 1

## 2016-09-24 MED ORDER — IPRATROPIUM-ALBUTEROL 0.5-2.5 (3) MG/3ML IN SOLN
RESPIRATORY_TRACT | Status: AC
  Filled 2016-09-24: qty 3

## 2016-09-24 MED ORDER — AZITHROMYCIN 250 MG PO TABS
500.0000 mg | ORAL_TABLET | Freq: Every day | ORAL | Status: AC
  Administered 2016-09-24: 500 mg via ORAL
  Filled 2016-09-24: qty 2

## 2016-09-24 MED ORDER — ONDANSETRON HCL 4 MG/2ML IJ SOLN: 4.0000 mg | Freq: Four times a day (QID) | INTRAMUSCULAR | Status: DC | PRN

## 2016-09-24 MED ORDER — ENOXAPARIN SODIUM 40 MG/0.4ML ~~LOC~~ SOLN
40.0000 mg | SUBCUTANEOUS | Status: DC
  Administered 2016-09-24 – 2016-09-29 (×6): 40 mg via SUBCUTANEOUS
  Filled 2016-09-24 (×6): qty 0.4

## 2016-09-24 MED ORDER — ALBUTEROL SULFATE (2.5 MG/3ML) 0.083% IN NEBU
2.5000 mg | INHALATION_SOLUTION | RESPIRATORY_TRACT | Status: DC | PRN
  Administered 2016-09-25: 2.5 mg via RESPIRATORY_TRACT
  Filled 2016-09-24: qty 3

## 2016-09-24 MED ORDER — VENLAFAXINE HCL ER 150 MG PO CP24
150.0000 mg | ORAL_CAPSULE | Freq: Every day | ORAL | Status: DC
  Administered 2016-09-24 – 2016-09-30 (×6): 150 mg via ORAL
  Filled 2016-09-24: qty 1
  Filled 2016-09-24 (×4): qty 2
  Filled 2016-09-24: qty 1
  Filled 2016-09-24: qty 2

## 2016-09-24 MED ORDER — METHYLPREDNISOLONE SODIUM SUCC 125 MG IJ SOLR
60.0000 mg | Freq: Four times a day (QID) | INTRAMUSCULAR | Status: DC
  Administered 2016-09-24 – 2016-09-26 (×6): 60 mg via INTRAVENOUS
  Filled 2016-09-24 (×7): qty 2

## 2016-09-24 NOTE — ED Notes (Signed)
CBG 200 

## 2016-09-24 NOTE — ED Notes (Signed)
[place] provided

## 2016-09-24 NOTE — ED Provider Notes (Signed)
[facility] DEPT Provider Note   CSN: [id number] Arrival date & time: 09/24/16  1055     History   Chief Complaint Chief Complaint  Patient presents with  . Shortness of Breath  . COPD  . Chest Pain    HPI Joan [name] is a 56 y.o. female.  HPI  Patient's 56 year old female presenting with COPD exacerbation. Patient reports that she feels like her nebulizer at home stopped working. She's been on prednisone for the last couple days by her PCP. She feels that she is not clinically improved. On arrival to room patient was tachypnea, audible wheezing, diffuse wheezing  With rales short of breath requiring oxygen  Past Medical History:  Diagnosis Date  . Arthritis   . COPD (chronic obstructive pulmonary disease) ([place])   . Diabetes mellitus without complication ([place])   . Hepatitis C   . Thyroid disease     There are no active problems to display for this patient.   Past Surgical History:  Procedure Laterality Date  . COLONOSCOPY    . FOOT SURGERY Bilateral    bone removed from 4th and 5 th toes and put in pins  . HERNIA REPAIR     umbilical  . NASAL SEPTUM SURGERY     deviated septum and removal of cyst in sinuses  . NECK SURGERY Right    fattty lipoma   . UPPER GI ENDOSCOPY    . WRIST SURGERY Right    repair artery from laceration from ice skate    OB History    No data available       Home Medications    Prior to Admission medications   Medication Sig Start Date End Date Taking? Authorizing Provider  albuterol (PROVENTIL) (2.5 MG/3ML) 0.083% nebulizer solution Take 3 mLs by nebulization every 6 (six) hours as needed for wheezing. 08/05/16   [provider]  atenolol (TENORMIN) 25 MG tablet Take by mouth daily.    [provider]  atorvastatin (LIPITOR) 20 MG tablet Take 20 mg by mouth daily.    [provider]  baclofen (LIORESAL) 10 MG tablet Take 10 mg by mouth at bedtime.    [provider]  budesonide (PULMICORT) 1  MG/2ML nebulizer solution Take 1 mg by nebulization 4 (four) times daily.    [provider]  clindamycin (CLEOCIN) 300 MG capsule Take 1 capsule (300 mg total) by mouth 4 (four) times daily. Patient not taking: Reported on 08/09/2016 08/05/13   Harden Mo, MD  formoterol (PERFOROMIST) 20 MCG/2ML nebulizer solution Take 20 mcg by nebulization 2 (two) times daily.    [provider]  Insulin Glargine (LANTUS SOLOSTAR) 100 UNIT/ML Solostar Pen Inject 18 Units into the skin daily.    [provider]  insulin lispro (HUMALOG KWIKPEN) 100 UNIT/ML KiwkPen Inject 7 Units into the skin 3 (three) times daily.    [provider]  levothyroxine (SYNTHROID, LEVOTHROID) 25 MCG tablet Take 25 mcg by mouth daily before breakfast.    [provider]  lisinopril (PRINIVIL,ZESTRIL) 10 MG tablet Take 10 mg by mouth daily.    [provider]  montelukast (SINGULAIR) 10 MG tablet Take 10 mg by mouth at bedtime.    [provider]  mupirocin ointment (BACTROBAN) 2 % Place 1 application into the nose 2 (two) times daily. Apply for 3-7 days Patient not taking: Reported on 08/09/2016 07/27/13   Waldemar Dickens, MD  predniSONE (DELTASONE) 10 MG tablet Take 4 tablets (40 mg total)  by mouth daily. 08/09/16   Fredia Sorrow, MD  roflumilast (DALIRESP) 500 MCG TABS tablet Take 500 mcg by mouth daily.    [provider]  sertraline (ZOLOFT) 50 MG tablet Take 50 mg by mouth daily.    [provider]  silver sulfADIAZINE (SILVADENE) 1 % cream Apply 1 application topically daily. Patient not taking: Reported on 08/09/2016 08/05/13   Harden Mo, MD  sulfamethoxazole-trimethoprim (BACTRIM DS) 800-160 MG per tablet Take 1 tablet by mouth 2 (two) times daily. Patient not taking: Reported on 08/09/2016 07/27/13   Waldemar Dickens, MD  tiotropium (SPIRIVA) 18 MCG inhalation capsule Place 18 mcg into inhaler and inhale daily.    [provider]    valACYclovir (VALTREX) 500 MG tablet Take 500 mg by mouth daily.     [provider]  venlafaxine XR (EFFEXOR-XR) 150 MG 24 hr capsule Take 150 mg by mouth daily with breakfast.    [provider]    Family History Family History  Problem Relation Age of Onset  . Diabetes Mother   . Hypertension Mother   . Hypertension Father   . Cancer Father        lung    Social History Social History  Substance Use Topics  . Smoking status: Current Every Day Smoker    Packs/day: 1.50    Types: Cigarettes  . Smokeless tobacco: Never Used  . Alcohol use No     Allergies   Dilaudid [hydromorphone hcl]; Iodine; Keflex [cephalexin]; Prednisone; Shellfish allergy; and Tetracyclines & related   Review of Systems Review of Systems  Constitutional: Negative for activity change and fever.  HENT: Negative for congestion.   Respiratory: Positive for cough, chest tightness and shortness of breath.   Cardiovascular: Negative for chest pain.  Gastrointestinal: Negative for abdominal pain.  Neurological: Negative for dizziness.  All other systems reviewed and are negative.    Physical Exam Updated Vital Signs BP (!) 150/91 (BP Location: Right Arm)   Pulse 90   Temp 97.9 F (36.6 C) (Oral)   Resp (!) 27   Ht 5\' 8"  (1.727 m)   Wt 89.8 kg (198 lb)   SpO2 99%   BMI 30.11 kg/m   Physical Exam  Constitutional: She is oriented to person, place, and time. She appears well-developed and well-nourished. She appears distressed.  Patient appears in distress.  HENT:  Head: Normocephalic and atraumatic.  Eyes: Right eye exhibits no discharge. Left eye exhibits no discharge.  Cardiovascular: Normal rate, regular rhythm and normal heart sounds.   No murmur heard. Pulmonary/Chest: She is in respiratory distress. She has wheezes. She has rales.  Abdominal: Soft. She exhibits no distension. There is no tenderness.  Neurological: She is oriented to person, place, and time.  Skin:  Skin is warm and dry. She is not diaphoretic.  Psychiatric: She has a normal mood and affect.  Nursing note and vitals reviewed.    ED Treatments / Results  Labs (all labs ordered are listed, but only abnormal results are displayed) Labs Reviewed  BASIC METABOLIC PANEL - Abnormal; Notable for the following:       Result Value   Glucose, Bld 105 (*)    All other components within normal limits  CBC - Abnormal; Notable for the following:    WBC 12.5 (*)    All other components within normal limits  I-STAT VENOUS BLOOD GAS, ED - Abnormal; Notable for the following:    pO2, Ven 46.0 (*)  Bicarbonate 34.8 (*)    Acid-Base Excess 9.0 (*)    All other components within normal limits  I-STAT TROPONIN, ED    EKG  EKG Interpretation None       Radiology No results found.  Procedures Procedures (including critical care time)  Medications Ordered in ED Medications  albuterol (PROVENTIL) (2.5 MG/3ML) 0.083% nebulizer solution 5 mg (not administered)  ipratropium-albuterol (DUONEB) 0.5-2.5 (3) MG/3ML nebulizer solution (not administered)  albuterol (PROVENTIL,VENTOLIN) solution continuous neb (7.5 mg/hr Nebulization New Bag/Given 09/24/16 1552)  ipratropium-albuterol (DUONEB) 0.5-2.5 (3) MG/3ML nebulizer solution 3 mL (3 mLs Nebulization Given 09/24/16 1116)     Initial Impression / Assessment and Plan / ED Course  I have reviewed the triage vital signs and the nursing notes.  Pertinent labs & imaging results that were available during my care of the patient were reviewed by me and considered in my medical decision making (see chart for details).    Patient is a 40 -year-old female presenting a COPD exacerbation. Treated with prednisone at home. Patient appears in acute distress on arrival. We'll put in place on BiPAP, continuous neb, give methylprednisolone.  5:49 PM Patient had an anxiety attack on the CAT. We'll put her back on BiPAP but without the continuous. We'll do  intermittent duo nebs, anxiety.   CRITICAL CARE Performed by: Gardiner Sleeper Total critical care time: 60 minutes Critical care time was exclusive of separately billable procedures and treating other patients. Critical care was necessary to treat or prevent imminent or life-threatening deterioration. Critical care was time spent personally by me on the following activities: development of treatment plan with patient and/or surrogate as well as nursing, discussions with consultants, evaluation of patient's response to treatment, examination of patient, obtaining history from patient or surrogate, ordering and performing treatments and interventions, ordering and review of laboratory studies, ordering and review of radiographic studies, pulse oximetry and re-evaluation of patient's condition.   Final Clinical Impressions(s) / ED Diagnoses   Final diagnoses:  None    New Prescriptions New Prescriptions   No medications on file     Macarthur Critchley, MD 09/24/16 1750

## 2016-09-24 NOTE — ED Notes (Signed)
MD paged per pt request. Pt request home medication to assist with sleeping.

## 2016-09-24 NOTE — ED Notes (Signed)
Called respiratory, per Dr. Thomasene Lot, to place pt on Bipap

## 2016-09-24 NOTE — ED Notes (Signed)
Admission MD art bedside

## 2016-09-24 NOTE — ED Triage Notes (Signed)
Pt. Stated, Im SOB and I have COPD, my chest is tight. My machine broke last night, and I had no way to get it better. They usually give me SoluMedrol and a breathing treatment.

## 2016-09-24 NOTE — H&P (Addendum)
History and Physical    Joan [name] RJJ:[id number] DOB: 22-Apr-1960 DOA: 09/24/2016  PCP: Rogers Blocker, MD > new, yet to see him.  I have briefly reviewed patients previous medical reports in [facility].  Patient coming from: Home  Chief Complaint: Worsening dyspnea.  HPI: Joan [name] is a 56 year old widowed female, recently moved to the [place] area, living with her brother, independent of activities of daily living, has a new PCP appointment to see Dr. Kevan Ny, PMH of COPD, ongoing tobacco abuse, OSA on nightly CPAP, denies HTN, DM or HLD but history in chart shows DM, hepatitis C, thyroid disease, presented to the ED with above symptoms. She states that she was in her usual state of health until middle of last week. Her brother had upper respiratory tract illness. She then started having a "flulike illness" with generalized body aches without fever or chills, cough with intermittent lime green/yellow sputum, dyspnea, wheezing, chest tightness, chest pain only on coughing. She started oral prednisone 40 MG daily that she had from prior prescriptions, home nebulizations without significant improvement. Last night apparently her nebulizing machine broke down. She thereby presented to the ED in extremis.  ED Course: Noted to be in respiratory distress, tachycardic, hypertensive, tachypneic, received a dose of Solu-Medrol 125 MG, bronchodilator nebulizations, IV magnesium, placed on BiPAP. Reports feeling slightly better. Lab work as below not significant. Chest x-ray shows bilateral upper lobe bullous disease without edema or consolidation.  Review of Systems:  All other systems reviewed and apart from HPI, are negative. She reports having panic attacks/anxiety when she is unable to breathe. Reports using Xanax in the past but has not been able to see an M.D. to have it refilled. Reports chronic low back pain from prior back surgery and has rods in the back. Uses Naprosyn when  necessary.  Past Medical History:  Diagnosis Date  . Arthritis   . COPD (chronic obstructive pulmonary disease) ([place])   . Diabetes mellitus without complication ([place])   . Hepatitis C   . Thyroid disease     Past Surgical History:  Procedure Laterality Date  . COLONOSCOPY    . FOOT SURGERY Bilateral    bone removed from 4th and 5 th toes and put in pins  . HERNIA REPAIR     umbilical  . NASAL SEPTUM SURGERY     deviated septum and removal of cyst in sinuses  . NECK SURGERY Right    fattty lipoma   . UPPER GI ENDOSCOPY    . WRIST SURGERY Right    repair artery from laceration from ice skate    Social History  reports that she has been smoking Cigarettes.  She has been smoking about 1.50 packs per day. She has never used smokeless tobacco. She reports that she does not drink alcohol or use drugs.  Allergies  Allergen Reactions  . Dilaudid [Hydromorphone Hcl] Other (See Comments)    "heavy sedation"  . Iodine Other (See Comments)    Stomach cramps  . Keflex [Cephalexin] Other (See Comments)    unspecified  . Prednisone Other (See Comments)    "counter reacts"  . Shellfish Allergy Other (See Comments)    Stomach cramps  . Tetracyclines & Related Other (See Comments)    unspecified    Family History  Problem Relation Age of Onset  . Diabetes Mother   . Hypertension Mother   . Hypertension Father   . Cancer Father  lung     Prior to Admission medications   Medication Sig Start Date End Date Taking? Authorizing Provider  albuterol (PROVENTIL) (2.5 MG/3ML) 0.083% nebulizer solution Take 3 mLs by nebulization every 6 (six) hours as needed for wheezing. 08/05/16  Yes [provider]  atenolol (TENORMIN) 25 MG tablet Take by mouth daily.   Yes [provider]  atorvastatin (LIPITOR) 20 MG tablet Take 20 mg by mouth daily.   Yes [provider]  budesonide (PULMICORT) 1 MG/2ML nebulizer solution Take 1 mg by nebulization 4 (four) times  daily.   Yes [provider]  budesonide-formoterol (SYMBICORT) 160-4.5 MCG/ACT inhaler Inhale 2 puffs into the lungs 2 (two) times daily.   Yes [provider]  formoterol (PERFOROMIST) 20 MCG/2ML nebulizer solution Take 20 mcg by nebulization 2 (two) times daily.   Yes [provider]  levothyroxine (SYNTHROID, LEVOTHROID) 25 MCG tablet Take 25 mcg by mouth daily before breakfast.   Yes [provider]  pregabalin (LYRICA) 75 MG capsule Take 75 mg by mouth 2 (two) times daily.   Yes [provider]  roflumilast (DALIRESP) 500 MCG TABS tablet Take 500 mcg by mouth daily.   Yes [provider]  tiotropium (SPIRIVA) 18 MCG inhalation capsule Place 18 mcg into inhaler and inhale daily.   Yes [provider]  valACYclovir (VALTREX) 500 MG tablet Take 500 mg by mouth daily.    Yes [provider]  venlafaxine XR (EFFEXOR-XR) 150 MG 24 hr capsule Take 150 mg by mouth daily with breakfast.   Yes [provider]  baclofen (LIORESAL) 10 MG tablet Take 10 mg by mouth at bedtime.    [provider]    Physical Exam: Vitals:   09/24/16 1730 09/24/16 1745 09/24/16 1748 09/24/16 1800  BP: (!) 144/81 (!) 163/91 (!) 163/91 (!) 172/147  Pulse: (!) 116 (!) 111 (!) 111 (!) 111  Resp: (!) 29 (!) 21 (!) 36 (!) 27  Temp:      TempSrc:      SpO2: 100% 100% 100% 100%  Weight:      Height:          Constitutional: Pleasant middle-aged female, moderately built and overweight, lying propped up in bed in the ED with mild to moderate respiratory distress. BiPAP +. Audible wheezes. Eyes: PERTLA, lids and conjunctivae normal ENMT: Mucous membranes are moist. Posterior pharynx clear of any exudate or lesions. Normal dentition.  Neck: supple, no masses, no thyromegaly Respiratory: Reduced breath sounds bilaterally. Scattered bilateral medium paced expiratory wheezing and rhonchi. No crackles heard. Pursed lip breathing. Mild to  moderate respiratory distress. BiPAP +.  Cardiovascular: S1 & S2 heard, regular mild tachycardia, no murmurs / rubs / gallops. No extremity edema. 2+ pedal pulses. No carotid bruits. Telemetry shows mild sinus tachycardia in the 100s. Abdomen: No distension, no tenderness, no masses palpated. No hepatosplenomegaly. Bowel sounds normal. Uncomplicated umbilical hernia +. Musculoskeletal: no clubbing / cyanosis. No joint deformity upper and lower extremities. Good ROM, no contractures. Normal muscle tone.  Skin: no rashes, lesions, ulcers. No induration Neurologic: CN 2-12 grossly intact. Sensation intact, DTR normal. Strength 5/5 in all 4 limbs.  Psychiatric: Normal judgment and insight. Alert and oriented x 3. Normal mood.     Labs on Admission: I have personally reviewed following labs and imaging studies  CBC:  Recent Labs Lab 09/24/16 1108  WBC 12.5*  HGB 14.4  HCT 42.3  MCV 97.0  PLT 295   Basic Metabolic  Panel:  Recent Labs Lab 09/24/16 1108  NA 142  K 4.3  CL 107  CO2 28  GLUCOSE 105*  BUN 14  CREATININE 0.88  CALCIUM 9.2    Radiological Exams on Admission: Dg Chest Portable 1 View  Result Date: 09/24/2016 CLINICAL DATA:  Shortness of breath.  History of COPD EXAM: PORTABLE CHEST 1 VIEW COMPARISON:  August 09, 2016 FINDINGS: There is evidence of upper lobe bullous disease. There is no edema or consolidation. Relative prominence of the interstitium in the lung bases is probably due to redistribution of blood flow to viable lung segments. Heart size is within normal limits. Pulmonary vascularity reflects upper lobe bullous disease. No adenopathy. No bone lesions. IMPRESSION: Bilateral upper lobe bullous disease, stable. No edema or consolidation. Stable cardiac silhouette. Electronically Signed   By: Lowella Grip III M.D.   On: 09/24/2016 16:30    EKG: Independently reviewed. Sinus rhythm at 82 bpm, normal axis, no acute findings, nonspecific T-wave changes, QTC 450  ms.  Assessment/Plan Principal Problem:   COPD exacerbation (HCC) Active Problems:   Acute on chronic respiratory failure with hypoxia (HCC)   Tobacco abuse   Anxiety   DM II (diabetes mellitus, type II), controlled ([place])     1. COPD exacerbation: Admitted to stepdown unit. Oxygen, Bronchodilator nebulizations, IV Solu-Medrol, Z-Pak, flutter valve, continue BiPAP for now and wean off as tolerated. Tobacco cessation counseled. Will need CM consult re new neb machine. 2. Acute respiratory failure with hypoxia: Secondary to COPD exacerbation. VBG reviewed: PH 7.428, PCO2: 52.7. Not retaining much CO2. Continue management for COPD and BiPAP, wean as tolerated. If patient decompensates, consult CCM. 3. Tobacco abuse: Cessation counseled. Nicotine patch. 4. Type II DM: Patient states that she is not on any medications at home. She reports her blood sugars increasing whenever she is on steroids and has used insulin's in the hospital. Check A1c. SSI. 5. HTN: Takes atenolol at home. Given COPD exacerbation, hold atenolol. When necessary IV hydralazine. 6. Anxiety: When necessary Ativan 7. OSA: Start nightly CPAP when off of BiPAP. 8. Chronic low back pain. When necessary Tylenol for now.   DVT prophylaxis: Lovenox  Code Status: Full  Family Communication: discussed with brother, updated care and answered questions. Disposition Plan: DC home when medically stable.  Consults called: None  Admission status: observation, stepdown unit.  Severity of Illness: The appropriate patient status for this patient is OBSERVATION. Observation status is judged to be reasonable and necessary in order to provide the required intensity of service to ensure the patient's safety. The patient's presenting symptoms, physical exam findings, and initial radiographic and laboratory data in the context of their medical condition is felt to place them at decreased risk for further clinical deterioration. Furthermore, it  is anticipated that the patient will be medically stable for discharge from the hospital within 2 midnights of admission. The following factors support the patient status of observation.   " The patient's presenting symptoms include productive cough, worsening dyspnea, wheezing.. " The physical exam findings include acute respiratory distress and clinical bronchospasm.. " The initial radiographic and laboratory data are chest x-ray showed COPD changes.Marland Kitchen       [facility] MD [facility] Pager [phone]  If 7PM-7AM, please contact night-coverage [email or web address] Password [email or web address]  09/24/2016, 6:30 PM

## 2016-09-24 NOTE — ED Notes (Signed)
PCXR at bedsdie

## 2016-09-24 NOTE — ED Notes (Signed)
Pt becomes very agitated and removes cpap.

## 2016-09-24 NOTE — ED Notes (Signed)
Pt very agitated and increased work of breathing. Pt "talked Down" relaxes a small amount

## 2016-09-25 ENCOUNTER — Encounter (HOSPITAL_COMMUNITY): Payer: Self-pay | Admitting: General Practice

## 2016-09-25 DIAGNOSIS — E039 Hypothyroidism, unspecified: Secondary | ICD-10-CM | POA: Diagnosis present

## 2016-09-25 DIAGNOSIS — M79604 Pain in right leg: Secondary | ICD-10-CM | POA: Diagnosis present

## 2016-09-25 DIAGNOSIS — J439 Emphysema, unspecified: Secondary | ICD-10-CM | POA: Diagnosis not present

## 2016-09-25 DIAGNOSIS — Z720 Tobacco use: Secondary | ICD-10-CM | POA: Diagnosis not present

## 2016-09-25 DIAGNOSIS — F17210 Nicotine dependence, cigarettes, uncomplicated: Secondary | ICD-10-CM | POA: Diagnosis present

## 2016-09-25 DIAGNOSIS — I10 Essential (primary) hypertension: Secondary | ICD-10-CM | POA: Diagnosis present

## 2016-09-25 DIAGNOSIS — J9621 Acute and chronic respiratory failure with hypoxia: Secondary | ICD-10-CM | POA: Diagnosis not present

## 2016-09-25 DIAGNOSIS — F419 Anxiety disorder, unspecified: Secondary | ICD-10-CM

## 2016-09-25 DIAGNOSIS — E1165 Type 2 diabetes mellitus with hyperglycemia: Secondary | ICD-10-CM | POA: Diagnosis not present

## 2016-09-25 DIAGNOSIS — F411 Generalized anxiety disorder: Secondary | ICD-10-CM | POA: Diagnosis present

## 2016-09-25 DIAGNOSIS — Z8701 Personal history of pneumonia (recurrent): Secondary | ICD-10-CM | POA: Diagnosis not present

## 2016-09-25 DIAGNOSIS — M545 Low back pain: Secondary | ICD-10-CM | POA: Diagnosis present

## 2016-09-25 DIAGNOSIS — R1031 Right lower quadrant pain: Secondary | ICD-10-CM | POA: Diagnosis present

## 2016-09-25 DIAGNOSIS — R002 Palpitations: Secondary | ICD-10-CM | POA: Diagnosis not present

## 2016-09-25 DIAGNOSIS — T380X5A Adverse effect of glucocorticoids and synthetic analogues, initial encounter: Secondary | ICD-10-CM | POA: Diagnosis present

## 2016-09-25 DIAGNOSIS — R69 Illness, unspecified: Secondary | ICD-10-CM | POA: Diagnosis not present

## 2016-09-25 DIAGNOSIS — G4733 Obstructive sleep apnea (adult) (pediatric): Secondary | ICD-10-CM | POA: Diagnosis present

## 2016-09-25 DIAGNOSIS — J9622 Acute and chronic respiratory failure with hypercapnia: Secondary | ICD-10-CM | POA: Diagnosis present

## 2016-09-25 DIAGNOSIS — D72829 Elevated white blood cell count, unspecified: Secondary | ICD-10-CM | POA: Diagnosis not present

## 2016-09-25 DIAGNOSIS — R062 Wheezing: Secondary | ICD-10-CM | POA: Diagnosis not present

## 2016-09-25 DIAGNOSIS — I6529 Occlusion and stenosis of unspecified carotid artery: Secondary | ICD-10-CM | POA: Diagnosis present

## 2016-09-25 DIAGNOSIS — J441 Chronic obstructive pulmonary disease with (acute) exacerbation: Secondary | ICD-10-CM | POA: Diagnosis not present

## 2016-09-25 DIAGNOSIS — Z981 Arthrodesis status: Secondary | ICD-10-CM | POA: Diagnosis not present

## 2016-09-25 DIAGNOSIS — E7800 Pure hypercholesterolemia, unspecified: Secondary | ICD-10-CM | POA: Diagnosis present

## 2016-09-25 DIAGNOSIS — M79609 Pain in unspecified limb: Secondary | ICD-10-CM | POA: Diagnosis not present

## 2016-09-25 DIAGNOSIS — Z8249 Family history of ischemic heart disease and other diseases of the circulatory system: Secondary | ICD-10-CM | POA: Diagnosis not present

## 2016-09-25 DIAGNOSIS — F319 Bipolar disorder, unspecified: Secondary | ICD-10-CM | POA: Diagnosis present

## 2016-09-25 DIAGNOSIS — R0602 Shortness of breath: Secondary | ICD-10-CM | POA: Diagnosis present

## 2016-09-25 DIAGNOSIS — R000 Tachycardia, unspecified: Secondary | ICD-10-CM | POA: Diagnosis present

## 2016-09-25 DIAGNOSIS — G8929 Other chronic pain: Secondary | ICD-10-CM | POA: Diagnosis present

## 2016-09-25 DIAGNOSIS — J383 Other diseases of vocal cords: Secondary | ICD-10-CM | POA: Diagnosis not present

## 2016-09-25 DIAGNOSIS — M1990 Unspecified osteoarthritis, unspecified site: Secondary | ICD-10-CM | POA: Diagnosis present

## 2016-09-25 LAB — GLUCOSE, CAPILLARY
Glucose-Capillary: 219 mg/dL — ABNORMAL HIGH (ref 65–99)
Glucose-Capillary: 220 mg/dL — ABNORMAL HIGH (ref 65–99)
Glucose-Capillary: 244 mg/dL — ABNORMAL HIGH (ref 65–99)
Glucose-Capillary: 140 mg/dL — ABNORMAL HIGH (ref 65–99)

## 2016-09-25 LAB — HIV ANTIBODY (ROUTINE TESTING W REFLEX): HIV Screen 4th Generation wRfx: NONREACTIVE

## 2016-09-25 LAB — MRSA PCR SCREENING: MRSA by PCR: POSITIVE — AB

## 2016-09-25 LAB — CBG MONITORING, ED: Glucose-Capillary: 235 mg/dL — ABNORMAL HIGH (ref 65–99)

## 2016-09-25 MED ORDER — LORAZEPAM 0.5 MG PO TABS
0.5000 mg | ORAL_TABLET | Freq: Once | ORAL | Status: AC
  Administered 2016-09-25: 0.5 mg via ORAL
  Filled 2016-09-25: qty 1

## 2016-09-25 MED ORDER — ARFORMOTEROL TARTRATE 15 MCG/2ML IN NEBU
15.0000 ug | INHALATION_SOLUTION | Freq: Two times a day (BID) | RESPIRATORY_TRACT | Status: DC
  Administered 2016-09-25 – 2016-09-26 (×3): 15 ug via RESPIRATORY_TRACT
  Filled 2016-09-25 (×3): qty 2

## 2016-09-25 MED ORDER — BUDESONIDE 0.5 MG/2ML IN SUSP
1.0000 mg | Freq: Two times a day (BID) | RESPIRATORY_TRACT | Status: DC
  Administered 2016-09-25 – 2016-09-26 (×3): 1 mg via RESPIRATORY_TRACT
  Administered 2016-09-27: 0.5 mg via RESPIRATORY_TRACT
  Filled 2016-09-25 (×4): qty 4

## 2016-09-25 MED ORDER — INSULIN ASPART 100 UNIT/ML ~~LOC~~ SOLN
0.0000 [IU] | Freq: Three times a day (TID) | SUBCUTANEOUS | Status: DC
  Administered 2016-09-25 – 2016-09-26 (×3): 5 [IU] via SUBCUTANEOUS
  Administered 2016-09-26: 8 [IU] via SUBCUTANEOUS
  Administered 2016-09-26: 2 [IU] via SUBCUTANEOUS
  Administered 2016-09-27: 3 [IU] via SUBCUTANEOUS
  Administered 2016-09-27: 5 [IU] via SUBCUTANEOUS
  Administered 2016-09-28 – 2016-09-29 (×2): 3 [IU] via SUBCUTANEOUS

## 2016-09-25 MED ORDER — IPRATROPIUM-ALBUTEROL 0.5-2.5 (3) MG/3ML IN SOLN
3.0000 mL | Freq: Four times a day (QID) | RESPIRATORY_TRACT | Status: DC
  Administered 2016-09-26 (×2): 3 mL via RESPIRATORY_TRACT
  Filled 2016-09-25 (×2): qty 3

## 2016-09-25 MED ORDER — INSULIN ASPART 100 UNIT/ML ~~LOC~~ SOLN: 0.0000 [IU] | Freq: Every day | SUBCUTANEOUS | Status: DC

## 2016-09-25 MED ORDER — BUDESONIDE 0.5 MG/2ML IN SUSP
1.0000 mg | Freq: Four times a day (QID) | RESPIRATORY_TRACT | Status: DC
  Administered 2016-09-25: 1 mg via RESPIRATORY_TRACT
  Filled 2016-09-25 (×2): qty 4

## 2016-09-25 MED ORDER — IPRATROPIUM-ALBUTEROL 0.5-2.5 (3) MG/3ML IN SOLN
3.0000 mL | RESPIRATORY_TRACT | Status: DC
  Administered 2016-09-25 (×4): 3 mL via RESPIRATORY_TRACT
  Filled 2016-09-25 (×3): qty 3

## 2016-09-25 NOTE — ED Notes (Signed)
Pt placed on CPAP per request for bed.

## 2016-09-25 NOTE — Progress Notes (Signed)
   Per patient request, advanced directive (AD) documentation left at bedside.  If/when patient decides to move forward w/ AD, please page on-call chaplain or contact the spiritual care department.  Will follow, as needed.

## 2016-09-25 NOTE — Progress Notes (Signed)
[facility]                                                                              Patient Demographics  Joan [name], is a 56 y.o. female, DOB - September 21, 1960, ZOX:[id number]  Admit date - 09/24/2016   Admitting Physician Elease Etienne, MD  Outpatient Primary MD for the patient is Gwenyth Bender, MD  Outpatient specialists:   LOS - 0  days   Medical records reviewed and are as summarized below:    Chief Complaint  Patient presents with  . Shortness of Breath  . COPD  . Chest Pain       Brief summary   The patient is a 56 year old female who recently moved to [facility] area, with history of COPD, ongoing tobacco use, OSA on nightly CPAP, diabetes presented with worsening dyspnea, wheezing. In ED was noted to be in respirator distress, tachycardia, hypotensive, tachypneic. The patient was given a dose of Solu-Medrol, bronchodilators, placed on BiPAP. She was admitted to stepdown unit.    Assessment & Plan    Principal Problem:   Acute on chronic respiratory failure with hypoxia (HCC) Likely due to COPD exacerbation - Currently off BiPAP however audibly wheezing - Continue IV Solu-Medrol 60mg  every 6 hours, scheduled DuoNeb's. - Added Brovana, Pulmicort, flutter valve - Recommended patient to quit smoking - Patient reports that she cannot tolerate oral prednisone high doses, will likely send home on 20 mg when she is ready - will benefit from outpatient pulmonology follow-up - Home O2 evaluation prior to discharge  Active Problems:    Tobacco abuse - Counseled strongly on tobacco cessation - Continue nicotine patch    Anxiety - Continue Ativan as needed  OSA on CPAP - Continue CPAP daily at bedtime    DM II (diabetes mellitus, type II), controlled (HCC) - CBGs elevated due to steroids, Placed on NovoLog moderate scale - Follow hemoglobin A1c  Hypothyroidism - Continue Synthroid   Code Status: full code  DVT Prophylaxis:  Lovenox    Family Communication: Discussed in detail with the patient, all imaging results, lab results explained to the patient   Disposition Plan:   Time Spent in minutes  25 minutes  Procedures:  Bipap  Consultants:     Antimicrobials:      Medications  Scheduled Meds: . arformoterol  15 mcg Nebulization BID  . atorvastatin  20 mg Oral Daily  . azithromycin  250 mg Oral Q24H  . budesonide  1 mg Nebulization BID  . enoxaparin (LOVENOX) injection  40 mg Subcutaneous Q24H  . insulin aspart  0-15 Units Subcutaneous TID WC  . insulin aspart  0-5 Units Subcutaneous QHS  . ipratropium-albuterol  3 mL Nebulization Q4H  . levothyroxine  25 mcg Oral QAC breakfast  . methylPREDNISolone (SOLU-MEDROL) injection  60 mg Intravenous Q6H  . nicotine  21 mg Transdermal Daily  . pregabalin  75 mg Oral BID  . roflumilast  500 mcg Oral Daily  . sodium chloride flush  3 mL Intravenous Q12H  . valACYclovir  500 mg Oral Daily  . venlafaxine XR  150 mg Oral Q breakfast  Continuous Infusions: . sodium chloride 10 mL/hr (09/24/16 2221)   PRN Meds:.acetaminophen **OR** acetaminophen, albuterol, diphenhydrAMINE, hydrALAZINE, LORazepam, ondansetron **OR** ondansetron (ZOFRAN) IV   Antibiotics   Anti-infectives    Start     Dose/Rate Route Frequency Ordered Stop   09/25/16 2000  azithromycin (ZITHROMAX) tablet 250 mg     250 mg Oral Every 24 hours 09/24/16 1831 09/29/16 1959   09/24/16 2115  valACYclovir (VALTREX) tablet 500 mg     500 mg Oral Daily 09/24/16 2101     09/24/16 2000  azithromycin (ZITHROMAX) tablet 500 mg     500 mg Oral Daily 09/24/16 1831 09/24/16 2059        Subjective:   Jacaria Liggon was seen and examined today. Still wheezing audibly, barely able to complete sentences. Bipap off.  Patient denies dizziness, chest pain, abdominal pain, N/V/D/C, new weakness, numbess, tingling. No acute events overnight.    Objective:   Vitals:   09/25/16 0559 09/25/16 0650 09/25/16 0722  09/25/16 0900  BP: (!) 164/101 (!) 160/91  (!) 156/77  Pulse: 93   85  Resp: 18 (!) 22  (!) 26  Temp:  98.3 F (36.8 C)  97.8 F (36.6 C)  TempSrc:  Oral  Oral  SpO2: 100% 95% 93% 94%  Weight:  92 kg (202 lb 13.2 oz)    Height:  5\' 8"  (1.727 m)      Intake/Output Summary (Last 24 hours) at 09/25/16 1106 Last data filed at 09/25/16 1033  Gross per 24 hour  Intake                3 ml  Output                0 ml  Net                3 ml     Wt Readings from Last 3 Encounters:  09/25/16 92 kg (202 lb 13.2 oz)  08/09/16 90.7 kg (200 lb)  07/31/13 102.7 kg (226 lb 6 oz)     Exam  General: Alert and oriented x 3, NAD, barely able to complete sentences due to wheezing   Eyes: PERRLA, EOMI, Anicteric Sclera  HEENT:  Atraumatic, normocephalic, normal oropharynx  Cardiovascular: S1 S2 auscultated, no rubs, murmurs or gallops. Regular rate and rhythm.  Respiratory: diffuse expiratory wheezing bilaterally   Gastrointestinal: Soft, nontender, nondistended, + bowel sounds  Ext: no pedal edema bilaterally  Neuro: AAOx3, Cr N's II- XII. Strength 5/5 upper and lower extremities bilaterally, speech clear, sensations grossly intact  Musculoskeletal: No digital cyanosis, clubbing  Skin: No rashes  Psych: Normal affect and demeanor, alert and oriented x3    Data Reviewed:  I have personally reviewed following labs and imaging studies  Micro Results Recent Results (from the past 240 hour(s))  MRSA PCR Screening     Status: Abnormal   Collection Time: 09/25/16  6:58 AM  Result Value Ref Range Status   MRSA by PCR POSITIVE (A) NEGATIVE Final    Comment:        The GeneXpert MRSA Assay (FDA approved for NASAL specimens only), is one component of a comprehensive MRSA colonization surveillance program. It is not intended to diagnose MRSA infection nor to guide or monitor treatment for MRSA infections. RESULT CALLED TO, READ BACK BY AND VERIFIED WITH: A. Earlene Plater RN 9:35  09/25/16 ([email or web address])     Radiology Reports Dg Chest Portable 1 View  Result Date: 09/24/2016 CLINICAL DATA:  Shortness of breath.  History of COPD EXAM: PORTABLE CHEST 1 VIEW COMPARISON:  August 09, 2016 FINDINGS: There is evidence of upper lobe bullous disease. There is no edema or consolidation. Relative prominence of the interstitium in the lung bases is probably due to redistribution of blood flow to viable lung segments. Heart size is within normal limits. Pulmonary vascularity reflects upper lobe bullous disease. No adenopathy. No bone lesions. IMPRESSION: Bilateral upper lobe bullous disease, stable. No edema or consolidation. Stable cardiac silhouette. Electronically Signed   By: Bretta Bang III M.D.   On: 09/24/2016 16:30    Lab Data:  CBC:  Recent Labs Lab 09/24/16 1108  WBC 12.5*  HGB 14.4  HCT 42.3  MCV 97.0  PLT 280   Basic Metabolic Panel:  Recent Labs Lab 09/24/16 1108  NA 142  K 4.3  CL 107  CO2 28  GLUCOSE 105*  BUN 14  CREATININE 0.88  CALCIUM 9.2   GFR: Estimated Creatinine Clearance: 86.6 mL/min (by C-G formula based on SCr of 0.88 mg/dL). Liver Function Tests: No results for input(s): AST, ALT, ALKPHOS, BILITOT, PROT, ALBUMIN in the last 168 hours. No results for input(s): LIPASE, AMYLASE in the last 168 hours. No results for input(s): AMMONIA in the last 168 hours. Coagulation Profile: No results for input(s): INR, PROTIME in the last 168 hours. Cardiac Enzymes: No results for input(s): CKTOTAL, CKMB, CKMBINDEX, TROPONINI in the last 168 hours. BNP (last 3 results) No results for input(s): PROBNP in the last 8760 hours. HbA1C: No results for input(s): HGBA1C in the last 72 hours. CBG:  Recent Labs Lab 09/24/16 2123 09/25/16 0022 09/25/16 0740  GLUCAP 200* 235* 219*   Lipid Profile: No results for input(s): CHOL, HDL, LDLCALC, TRIG, CHOLHDL, LDLDIRECT in the last 72 hours. Thyroid Function Tests: No results for input(s): TSH,  T4TOTAL, FREET4, T3FREE, THYROIDAB in the last 72 hours. Anemia Panel: No results for input(s): VITAMINB12, FOLATE, FERRITIN, TIBC, IRON, RETICCTPCT in the last 72 hours. Urine analysis:    Component Value Date/Time   COLORURINE YELLOW 07/31/2013 2237   APPEARANCEUR CLEAR 07/31/2013 2237   LABSPEC 1.006 07/31/2013 2237   PHURINE 6.5 07/31/2013 2237   GLUCOSEU NEGATIVE 07/31/2013 2237   HGBUR NEGATIVE 07/31/2013 2237   BILIRUBINUR NEGATIVE 07/31/2013 2237   KETONESUR NEGATIVE 07/31/2013 2237   PROTEINUR NEGATIVE 07/31/2013 2237   UROBILINOGEN 0.2 07/31/2013 2237   NITRITE NEGATIVE 07/31/2013 2237   LEUKOCYTESUR NEGATIVE 07/31/2013 2237     Rett Stehlik M.D. [facility] 09/25/2016, 11:06 AM  Pager: [phone] Between 7am to 7pm - call Pager - [phone]  After 7pm go to [email or web address] - password [email or web address]  Call night coverage person covering after 7pm

## 2016-09-25 NOTE — Care Management Note (Addendum)
Case Management Note  Patient Details  Name: Joan [name] MRN: [id number] Date of Birth: [birth year]  Subjective/Objective:  From home with her brother ,but she has her own space. She recently moved from [place] and has applied for [facility] Medicaid but has not gotten any updates on this yet per CSW note.  She has a walker , she presents with copd ex, required bipap. A nebulizer machine has been ordered for home for patient , [name] with [facility] notified, patient will need this delivered to her room before discharge.                  Action/Plan: NCM will follow for dc needs.  Expected Discharge Date:                  Expected Discharge Plan:     In-House Referral:     Discharge planning Services  CM Consult  Post Acute Care Choice:    Choice offered to:     DME Arranged:    DME Agency:     HH Arranged:    HH Agency:     Status of Service:  In process, will continue to follow  If discussed at Long Length of Stay Meetings, dates discussed:    Additional Comments:  Zenon Mayo, RN 09/25/2016, 5:35 PM

## 2016-09-25 NOTE — ED Notes (Signed)
Pt given [place] sandwich and coffee per RN

## 2016-09-25 NOTE — Progress Notes (Signed)
RT placed patient on CPAP HS. 4L O2 bleed in needed. Patient tolerating well at this time.  

## 2016-09-25 NOTE — Care Management Obs Status (Signed)
[facility] NOTIFICATION   Patient Details  Name: Joan [name] MRN: [id number] Date of Birth: [birth year]   Medicare Observation Status Notification Given:  Yes    Zenon Mayo, RN 09/25/2016, 5:35 PM

## 2016-09-26 LAB — BASIC METABOLIC PANEL
Anion gap: 11 (ref 5–15)
BUN: 22 mg/dL — ABNORMAL HIGH (ref 6–20)
CO2: 24 mmol/L (ref 22–32)
Calcium: 9 mg/dL (ref 8.9–10.3)
Chloride: 105 mmol/L (ref 101–111)
Creatinine, Ser: 0.97 mg/dL (ref 0.44–1.00)
GFR calc Af Amer: >60 mL/min (ref >60)
GFR calc non Af Amer: >60 mL/min (ref >60)
Glucose, Bld: 292 mg/dL — ABNORMAL HIGH (ref 65–99)
Potassium: 4.4 mmol/L (ref 3.5–5.1)
Sodium: 140 mmol/L (ref 135–145)

## 2016-09-26 LAB — GLUCOSE, CAPILLARY
Glucose-Capillary: 269 mg/dL — ABNORMAL HIGH (ref 65–99)
Glucose-Capillary: 229 mg/dL — ABNORMAL HIGH (ref 65–99)
Glucose-Capillary: 248 mg/dL — ABNORMAL HIGH (ref 65–99)
Glucose-Capillary: 103 mg/dL — ABNORMAL HIGH (ref 65–99)

## 2016-09-26 LAB — CBC
HCT: 41.8 % (ref 36.0–46.0)
Hemoglobin: 13.9 g/dL (ref 12.0–15.0)
MCH: 32.3 pg (ref 26.0–34.0)
MCHC: 33.3 g/dL (ref 30.0–36.0)
MCV: 97.2 fL (ref 78.0–100.0)
Platelets: 279 10*3/uL (ref 150–400)
RBC: 4.3 MIL/uL (ref 3.87–5.11)
RDW: 13.6 % (ref 11.5–15.5)
WBC: 24.8 10*3/uL — ABNORMAL HIGH (ref 4.0–10.5)

## 2016-09-26 LAB — D-DIMER, QUANTITATIVE: D-Dimer, Quant: <0.27 ug/mL-FEU (ref 0.00–0.50)

## 2016-09-26 LAB — HEMOGLOBIN A1C
Hgb A1c MFr Bld: 5.9 % — ABNORMAL HIGH (ref 4.8–5.6)
Mean Plasma Glucose: 123 mg/dL

## 2016-09-26 LAB — TSH: TSH: 0.107 u[IU]/mL — ABNORMAL LOW (ref 0.350–4.500)

## 2016-09-26 MED ORDER — LEVALBUTEROL HCL 0.63 MG/3ML IN NEBU: 0.6300 mg | INHALATION_SOLUTION | RESPIRATORY_TRACT | Status: DC | PRN

## 2016-09-26 MED ORDER — MUPIROCIN 2 % EX OINT
1.0000 "application " | TOPICAL_OINTMENT | Freq: Two times a day (BID) | CUTANEOUS | Status: DC
  Administered 2016-09-26 – 2016-09-30 (×9): 1 via NASAL
  Filled 2016-09-26 (×5): qty 22

## 2016-09-26 MED ORDER — METOPROLOL TARTRATE 5 MG/5ML IV SOLN
5.0000 mg | Freq: Once | INTRAVENOUS | Status: AC
  Administered 2016-09-26: 5 mg via INTRAVENOUS

## 2016-09-26 MED ORDER — IPRATROPIUM BROMIDE 0.02 % IN SOLN
0.5000 mg | Freq: Four times a day (QID) | RESPIRATORY_TRACT | Status: DC
  Administered 2016-09-26 – 2016-09-30 (×12): 0.5 mg via RESPIRATORY_TRACT
  Filled 2016-09-26 (×13): qty 2.5

## 2016-09-26 MED ORDER — METOPROLOL TARTRATE 5 MG/5ML IV SOLN
INTRAVENOUS | Status: AC
  Administered 2016-09-26: 5 mg
  Filled 2016-09-26: qty 5

## 2016-09-26 MED ORDER — LORAZEPAM 1 MG PO TABS
1.0000 mg | ORAL_TABLET | Freq: Four times a day (QID) | ORAL | Status: DC | PRN
  Administered 2016-09-26 – 2016-09-28 (×9): 1 mg via ORAL
  Filled 2016-09-26 (×9): qty 1

## 2016-09-26 MED ORDER — METOPROLOL TARTRATE 25 MG PO TABS
25.0000 mg | ORAL_TABLET | Freq: Two times a day (BID) | ORAL | Status: DC
  Administered 2016-09-27 – 2016-09-30 (×7): 25 mg via ORAL
  Filled 2016-09-26 (×9): qty 1

## 2016-09-26 MED ORDER — LEVALBUTEROL HCL 0.63 MG/3ML IN NEBU
0.6300 mg | INHALATION_SOLUTION | Freq: Four times a day (QID) | RESPIRATORY_TRACT | Status: DC
  Administered 2016-09-26 – 2016-09-30 (×12): 0.63 mg via RESPIRATORY_TRACT
  Filled 2016-09-26 (×13): qty 3

## 2016-09-26 MED ORDER — INSULIN GLARGINE 100 UNIT/ML ~~LOC~~ SOLN
9.0000 [IU] | Freq: Every day | SUBCUTANEOUS | Status: DC
  Administered 2016-09-26: 9 [IU] via SUBCUTANEOUS
  Filled 2016-09-26: qty 0.09

## 2016-09-26 MED ORDER — MONTELUKAST SODIUM 10 MG PO TABS
10.0000 mg | ORAL_TABLET | Freq: Every day | ORAL | Status: DC
  Administered 2016-09-26 – 2016-09-29 (×4): 10 mg via ORAL
  Filled 2016-09-26 (×4): qty 1

## 2016-09-26 MED ORDER — BACLOFEN 10 MG PO TABS
10.0000 mg | ORAL_TABLET | Freq: Every day | ORAL | Status: DC
  Administered 2016-09-26 – 2016-09-29 (×4): 10 mg via ORAL
  Filled 2016-09-26 (×4): qty 1

## 2016-09-26 MED ORDER — CHLORHEXIDINE GLUCONATE CLOTH 2 % EX PADS
6.0000 | MEDICATED_PAD | Freq: Every day | CUTANEOUS | Status: DC
  Administered 2016-09-27 – 2016-09-30 (×4): 6 via TOPICAL

## 2016-09-26 MED ORDER — INSULIN ASPART 100 UNIT/ML ~~LOC~~ SOLN
3.0000 [IU] | Freq: Three times a day (TID) | SUBCUTANEOUS | Status: DC
  Administered 2016-09-26 – 2016-09-27 (×3): 3 [IU] via SUBCUTANEOUS

## 2016-09-26 MED ORDER — METHYLPREDNISOLONE SODIUM SUCC 125 MG IJ SOLR
60.0000 mg | Freq: Three times a day (TID) | INTRAMUSCULAR | Status: DC
  Administered 2016-09-26 – 2016-09-27 (×2): 60 mg via INTRAVENOUS
  Filled 2016-09-26 (×2): qty 2

## 2016-09-26 NOTE — Progress Notes (Signed)
Inpatient Diabetes Program Recommendations  AACE/ADA: New Consensus Statement on Inpatient Glycemic Control (2015)  Target Ranges:  Prepandial:   less than 140 mg/dL      Peak postprandial:   less than 180 mg/dL (1-2 hours)      Critically ill patients:  140 - 180 mg/dL   Results for TAHARA, RUFFINI (MRN [id number]) as of 09/26/2016 10:56  Ref. Range 09/25/2016 07:40 09/25/2016 11:57 09/25/2016 15:47 09/25/2016 21:19 09/26/2016 07:53  Glucose-Capillary Latest Ref Range: 65 - 99 mg/dL 219 (H) 220 (H) 244 (H) 140 (H) 269 (H)   Review of Glycemic Control  Diabetes history: DM2 (diet controlled) Outpatient Diabetes medications: None Current orders for Inpatient glycemic control: Novolog 0-15 units TID with meals, Novolog 0-5 units QHS  Inpatient Diabetes Program Recommendations: Insulin - Basal: If steroids are continued, please consider ordering Lantus 9 units Q24H (based on 93 kg x 0.1 units). Please note that if Lantus is ordered as recommended, it will need to be adjusted as steroids are tapered. Insulin - Meal Coverage: If steroids are continued, please consider ordering Novolog 3 units TID with meals for meal coverage if patient eats at least 50% of meals.  Thanks, Barnie Alderman, RN, MSN, CDE Diabetes Coordinator Inpatient Diabetes Program [phone] (Team Pager from 8am to 5pm)

## 2016-09-26 NOTE — Progress Notes (Signed)
Advance Directives completed. Will be witnessed and notarized on tomorrow. There are no witnesses or notary available.  Provided emotional support and ministry of presence.    09/26/16 1533  Clinical Encounter Type  Visited With Patient and family together;Health care provider  Visit Type Initial;Spiritual support  Referral From Nurse  Spiritual Encounters  Spiritual Needs Literature;Prayer;Emotional  Stress Factors  Patient Stress Factors None identified  Family Stress Factors None identified  Advance Directives (For Healthcare)  Does Patient Have a Medical Advance Directive? Yes  Type of Paramedic of [place];Living will  Copy of [place] in Chart? No - copy requested  Copy of Living Will in Chart? No - copy requested  Cristopher Peru, [facility], Pager [phone]

## 2016-09-26 NOTE — Progress Notes (Signed)
CSW consulted for issues with patient Medicaid.   CSW met with patient to discuss- pt moved to [place] recently from [place] and had to apply for [facility] Medicaid and has not gotten any updates.  Pt received a reference number from [facility] for her application.  CSW explained that hospital CSW does not have any connection with DHHS/Medicaid application and that because patient is insured through Medicare that financial counseling would not be seeing her regarding Medicaid application.  CSW encouraged pt to call DHHS and provide with reference number to see if she could have update on progress of application.  No further CSW needs at this time- signing off  Jorge Ny, [place] Social Worker [phone]

## 2016-09-26 NOTE — Progress Notes (Signed)
Patient experiencing increased work of breathing, placed on BIPAP. Metoprolol 5mg  changed to IV for one time dose

## 2016-09-26 NOTE — Progress Notes (Addendum)
[facility]                                                                              Patient Demographics  Joan [name], is a 56 y.o. female, DOB - 11/16/1960, BWG:[id number]  Admit date - 09/24/2016   Admitting Physician Modena Jansky, MD  Outpatient Primary MD for the patient is Rogers Blocker, MD  Outpatient specialists:   LOS - 1  days   Medical records reviewed and are as summarized below:    Chief Complaint  Patient presents with  . Shortness of Breath  . COPD  . Chest Pain       Brief summary   The patient is a 56 year old female who recently moved to [facility] area, with history of COPD, ongoing tobacco use, OSA on nightly CPAP, diabetes presented with worsening dyspnea, wheezing. In ED was noted to be in respirator distress, tachycardia, hypotensive, tachypneic. The patient was given a dose of Solu-Medrol, bronchodilators, placed on BiPAP. She was admitted to stepdown unit.    Assessment & Plan    Principal Problem:   Acute on chronic respiratory failure with hypoxia (HCC) Likely due to COPD exacerbation - Improving however still wheezing - We will continue IV Solu-Medrol 60 mg IV 6 hours today, continue scheduled DuoNeb's, continue Brovana, Pulmicort  - Recommended patient to quit smoking - Patient reports that she cannot tolerate oral prednisone high doses, will likely send home on 20 mg when she is ready - will benefit from outpatient pulmonology follow-up - Home O2 evaluation prior to discharge - Ordered outpatient DME nebulizer machine (broken at home)  Active Problems:    Tobacco abuse - Counseled strongly on tobacco cessation - Continue nicotine patch    Anxiety - Continue Ativan as needed  OSA on CPAP - Continue CPAP daily at bedtime    DM II (diabetes mellitus, type II), controlled ([place]) - Hemoglobin A1c 5.9. Hyperglycemia likely due to steroids.  - While inpatient placed on Lantus 9 units and NovoLog 3 units with meals  to cover for steroid-induced hyperglycemia.   Hypothyroidism - Continue Synthroid  Addendum Called by RN regarding patient's anxiety attack, sinus tachycardia and calling her brother that 'she is dying'. When I called her room, her brother had left.  Sinus tachycardia: multifactorial,  patient noted to be on atenolol at home which was held on admission and can cause reflex tachycardia due to BB withdrawal, anxiety, scheduled albuterol treatments, brovana    - TSH, d-dimer, 2d-echo -changed to xopenex q6hrs, DC'ed brovana     Code Status: full code  DVT Prophylaxis:  Lovenox  Family Communication: Discussed in detail with the patient, all imaging results, lab results explained to the patient   Disposition Plan:   Time Spent in minutes  25 minutes  Procedures:  Bipap  Consultants:     Antimicrobials:      Medications  Scheduled Meds: . arformoterol  15 mcg Nebulization BID  . atorvastatin  20 mg Oral Daily  . azithromycin  250 mg Oral Q24H  . baclofen  10 mg Oral QHS  . budesonide  1 mg Nebulization BID  .  enoxaparin (LOVENOX) injection  40 mg Subcutaneous Q24H  . insulin aspart  0-15 Units Subcutaneous TID WC  . insulin aspart  0-5 Units Subcutaneous QHS  . insulin aspart  3 Units Subcutaneous TID WC  . insulin glargine  9 Units Subcutaneous QHS  . ipratropium-albuterol  3 mL Nebulization QID  . levothyroxine  25 mcg Oral QAC breakfast  . methylPREDNISolone (SOLU-MEDROL) injection  60 mg Intravenous Q6H  . montelukast  10 mg Oral QHS  . nicotine  21 mg Transdermal Daily  . pregabalin  75 mg Oral BID  . roflumilast  500 mcg Oral Daily  . sodium chloride flush  3 mL Intravenous Q12H  . valACYclovir  500 mg Oral Daily  . venlafaxine XR  150 mg Oral Q breakfast   Continuous Infusions: . sodium chloride 10 mL/hr at 09/26/16 0800   PRN Meds:.acetaminophen **OR** acetaminophen, albuterol, diphenhydrAMINE, hydrALAZINE, LORazepam, ondansetron **OR** ondansetron  (ZOFRAN) IV   Antibiotics   Anti-infectives    Start     Dose/Rate Route Frequency Ordered Stop   09/25/16 2000  azithromycin (ZITHROMAX) tablet 250 mg     250 mg Oral Every 24 hours 09/24/16 1831 09/29/16 1959   09/24/16 2115  valACYclovir (VALTREX) tablet 500 mg     500 mg Oral Daily 09/24/16 2101     09/24/16 2000  azithromycin (ZITHROMAX) tablet 500 mg     500 mg Oral Daily 09/24/16 1831 09/24/16 2059        Subjective:   [name] was seen and examined today. Improving however still wheezing bilaterally, did not need BiPAP overnight. Shortness of breath is still persistent, feels winded on going to the bathroom. Patient denies dizziness, chest pain, abdominal pain, N/V/D/C, new weakness, numbess, tingling. No acute events overnight.    Objective:   Vitals:   09/26/16 0749 09/26/16 0800 09/26/16 1202 09/26/16 1210  BP: (!) 155/82   (!) 166/116  Pulse: 84   (!) 117  Resp: 19   (!) 36  Temp: 98.3 F (36.8 C)   98.3 F (36.8 C)  TempSrc: Oral   Oral  SpO2: 97% 97% 97% 96%  Weight:  93.6 kg (206 lb 6.4 oz)    Height:        Intake/Output Summary (Last 24 hours) at 09/26/16 1240 Last data filed at 09/26/16 1156  Gross per 24 hour  Intake          1698.83 ml  Output             2600 ml  Net          -901.17 ml     Wt Readings from Last 3 Encounters:  09/26/16 93.6 kg (206 lb 6.4 oz)  08/09/16 90.7 kg (200 lb)  07/31/13 102.7 kg (226 lb 6 oz)     Exam Physical Exam  General: Alert and oriented x 3, NAD  Eyes:   HEENT:    Cardiovascular: S1 S2 auscultated, no rubs, murmurs or gallops. Regular rate and rhythm. No pedal edema b/l  Respiratory: Bilateral expiratory wheezing, improving from yesterday  Gastrointestinal: Soft, nontender, nondistended, + bowel sounds  Ext: no pedal edema bilaterally  Neuro: AAOx3, Cr N's II- XII. Strength 5/5 upper and lower extremities bilaterally, speech clear, sensations grossly intact  Musculoskeletal: No digital  cyanosis, clubbing  Skin: No rashes  Psych: Normal affect and demeanor, alert and oriented x3    Data Reviewed:  I have personally reviewed following labs and imaging studies  Micro Results  Recent Results (from the past 240 hour(s))  MRSA PCR Screening     Status: Abnormal   Collection Time: 09/25/16  6:58 AM  Result Value Ref Range Status   MRSA by PCR POSITIVE (A) NEGATIVE Final    Comment:        The GeneXpert MRSA Assay (FDA approved for NASAL specimens only), is one component of a comprehensive MRSA colonization surveillance program. It is not intended to diagnose MRSA infection nor to guide or monitor treatment for MRSA infections. RESULT CALLED TO, READ BACK BY AND VERIFIED WITH: A. Rosana Hoes RN 9:35 09/25/16 ([email or web address])     Radiology Reports Dg Chest Portable 1 View  Result Date: 09/24/2016 CLINICAL DATA:  Shortness of breath.  History of COPD EXAM: PORTABLE CHEST 1 VIEW COMPARISON:  August 09, 2016 FINDINGS: There is evidence of upper lobe bullous disease. There is no edema or consolidation. Relative prominence of the interstitium in the lung bases is probably due to redistribution of blood flow to viable lung segments. Heart size is within normal limits. Pulmonary vascularity reflects upper lobe bullous disease. No adenopathy. No bone lesions. IMPRESSION: Bilateral upper lobe bullous disease, stable. No edema or consolidation. Stable cardiac silhouette. Electronically Signed   By: Lowella Grip III M.D.   On: 09/24/2016 16:30    Lab Data:  CBC:  Recent Labs Lab 09/24/16 1108 09/26/16 0233  WBC 12.5* 24.8*  HGB 14.4 13.9  HCT 42.3 41.8  MCV 97.0 97.2  PLT 280 389   Basic Metabolic Panel:  Recent Labs Lab 09/24/16 1108 09/26/16 0233  NA 142 140  K 4.3 4.4  CL 107 105  CO2 28 24  GLUCOSE 105* 292*  BUN 14 22*  CREATININE 0.88 0.97  CALCIUM 9.2 9.0   GFR: Estimated Creatinine Clearance: 79.3 mL/min (by C-G formula based on SCr of 0.97 mg/dL). Liver  Function Tests: No results for input(s): AST, ALT, ALKPHOS, BILITOT, PROT, ALBUMIN in the last 168 hours. No results for input(s): LIPASE, AMYLASE in the last 168 hours. No results for input(s): AMMONIA in the last 168 hours. Coagulation Profile: No results for input(s): INR, PROTIME in the last 168 hours. Cardiac Enzymes: No results for input(s): CKTOTAL, CKMB, CKMBINDEX, TROPONINI in the last 168 hours. BNP (last 3 results) No results for input(s): PROBNP in the last 8760 hours. HbA1C:  Recent Labs  09/24/16 2118  HGBA1C 5.9*   CBG:  Recent Labs Lab 09/25/16 1157 09/25/16 1547 09/25/16 2119 09/26/16 0753 09/26/16 1212  GLUCAP 220* 244* 140* 269* 229*   Lipid Profile: No results for input(s): CHOL, HDL, LDLCALC, TRIG, CHOLHDL, LDLDIRECT in the last 72 hours. Thyroid Function Tests: No results for input(s): TSH, T4TOTAL, FREET4, T3FREE, THYROIDAB in the last 72 hours. Anemia Panel: No results for input(s): VITAMINB12, FOLATE, FERRITIN, TIBC, IRON, RETICCTPCT in the last 72 hours. Urine analysis:    Component Value Date/Time   COLORURINE YELLOW 07/31/2013 2237   APPEARANCEUR CLEAR 07/31/2013 2237   LABSPEC 1.006 07/31/2013 2237   PHURINE 6.5 07/31/2013 2237   GLUCOSEU NEGATIVE 07/31/2013 2237   HGBUR NEGATIVE 07/31/2013 2237   BILIRUBINUR NEGATIVE 07/31/2013 2237   KETONESUR NEGATIVE 07/31/2013 2237   PROTEINUR NEGATIVE 07/31/2013 2237   UROBILINOGEN 0.2 07/31/2013 2237   NITRITE NEGATIVE 07/31/2013 2237   LEUKOCYTESUR NEGATIVE 07/31/2013 2237     Ripudeep Rai M.D. [facility] 09/26/2016, 12:40 PM  Pager: [phone] Between 7am to 7pm - call Pager - [phone]  After 7pm go to [email or web address] - password [facility]  Call night coverage person covering after 7pm

## 2016-09-26 NOTE — Plan of Care (Signed)
Problem: Spiritual Needs Goal: Ability to function at adequate level Outcome: Progressing Patient and family (brother) were able to obtain a spiritual care consult and create an advance directive. The document will be notorized and placed in the patients chart September 27, 2016

## 2016-09-27 ENCOUNTER — Inpatient Hospital Stay (HOSPITAL_COMMUNITY): Payer: Medicare HMO

## 2016-09-27 DIAGNOSIS — R000 Tachycardia, unspecified: Secondary | ICD-10-CM

## 2016-09-27 DIAGNOSIS — J9621 Acute and chronic respiratory failure with hypoxia: Secondary | ICD-10-CM

## 2016-09-27 DIAGNOSIS — M79609 Pain in unspecified limb: Secondary | ICD-10-CM

## 2016-09-27 LAB — BASIC METABOLIC PANEL
Anion gap: 9 (ref 5–15)
BUN: 29 mg/dL — ABNORMAL HIGH (ref 6–20)
CO2: 27 mmol/L (ref 22–32)
Calcium: 9.2 mg/dL (ref 8.9–10.3)
Chloride: 104 mmol/L (ref 101–111)
Creatinine, Ser: 0.91 mg/dL (ref 0.44–1.00)
GFR calc Af Amer: >60 mL/min (ref >60)
GFR calc non Af Amer: >60 mL/min (ref >60)
Glucose, Bld: 217 mg/dL — ABNORMAL HIGH (ref 65–99)
Potassium: 4.6 mmol/L (ref 3.5–5.1)
Sodium: 140 mmol/L (ref 135–145)

## 2016-09-27 LAB — CBC
HCT: 42.1 % (ref 36.0–46.0)
Hemoglobin: 14.1 g/dL (ref 12.0–15.0)
MCH: 33 pg (ref 26.0–34.0)
MCHC: 33.5 g/dL (ref 30.0–36.0)
MCV: 98.6 fL (ref 78.0–100.0)
Platelets: 284 10*3/uL (ref 150–400)
RBC: 4.27 MIL/uL (ref 3.87–5.11)
RDW: 13.8 % (ref 11.5–15.5)
WBC: 23.5 10*3/uL — ABNORMAL HIGH (ref 4.0–10.5)

## 2016-09-27 LAB — GLUCOSE, CAPILLARY
Glucose-Capillary: 226 mg/dL — ABNORMAL HIGH (ref 65–99)
Glucose-Capillary: 193 mg/dL — ABNORMAL HIGH (ref 65–99)
Glucose-Capillary: 87 mg/dL (ref 65–99)
Glucose-Capillary: 132 mg/dL — ABNORMAL HIGH (ref 65–99)

## 2016-09-27 LAB — ECHOCARDIOGRAM COMPLETE
Height: 68 in
Weight: 3318.4 oz

## 2016-09-27 LAB — EXPECTORATED SPUTUM ASSESSMENT W REFEX TO RESP CULTURE

## 2016-09-27 LAB — EXPECTORATED SPUTUM ASSESSMENT W GRAM STAIN, RFLX TO RESP C

## 2016-09-27 LAB — PROCALCITONIN: Procalcitonin: <0.1 ng/mL

## 2016-09-27 LAB — T4, FREE: Free T4: 0.72 ng/dL (ref 0.61–1.12)

## 2016-09-27 MED ORDER — LEVOFLOXACIN IN D5W 750 MG/150ML IV SOLN
750.0000 mg | INTRAVENOUS | Status: DC
  Administered 2016-09-27 – 2016-09-29 (×3): 750 mg via INTRAVENOUS
  Filled 2016-09-27 (×3): qty 150

## 2016-09-27 MED ORDER — INSULIN ASPART 100 UNIT/ML ~~LOC~~ SOLN
4.0000 [IU] | Freq: Three times a day (TID) | SUBCUTANEOUS | Status: DC
  Administered 2016-09-27 – 2016-09-29 (×6): 4 [IU] via SUBCUTANEOUS

## 2016-09-27 MED ORDER — ARFORMOTEROL TARTRATE 15 MCG/2ML IN NEBU
15.0000 ug | INHALATION_SOLUTION | Freq: Two times a day (BID) | RESPIRATORY_TRACT | Status: DC
  Administered 2016-09-27 – 2016-09-30 (×6): 15 ug via RESPIRATORY_TRACT
  Filled 2016-09-27 (×6): qty 2

## 2016-09-27 MED ORDER — PANTOPRAZOLE SODIUM 40 MG IV SOLR
40.0000 mg | Freq: Two times a day (BID) | INTRAVENOUS | Status: DC
  Administered 2016-09-27 – 2016-09-30 (×7): 40 mg via INTRAVENOUS
  Filled 2016-09-27 (×6): qty 40

## 2016-09-27 MED ORDER — INSULIN GLARGINE 100 UNIT/ML ~~LOC~~ SOLN
12.0000 [IU] | Freq: Every day | SUBCUTANEOUS | Status: DC
  Administered 2016-09-27 – 2016-09-28 (×2): 12 [IU] via SUBCUTANEOUS
  Filled 2016-09-27 (×2): qty 0.12

## 2016-09-27 MED ORDER — DEXAMETHASONE SODIUM PHOSPHATE 10 MG/ML IJ SOLN
4.0000 mg | Freq: Four times a day (QID) | INTRAMUSCULAR | Status: AC
  Administered 2016-09-27 – 2016-09-28 (×3): 4 mg via INTRAVENOUS
  Filled 2016-09-27 (×3): qty 1

## 2016-09-27 MED ORDER — BUDESONIDE 0.5 MG/2ML IN SUSP
0.5000 mg | Freq: Two times a day (BID) | RESPIRATORY_TRACT | Status: DC
  Administered 2016-09-27 – 2016-09-30 (×6): 0.5 mg via RESPIRATORY_TRACT
  Filled 2016-09-27 (×6): qty 2

## 2016-09-27 NOTE — Progress Notes (Signed)
[facility]                                                                              Patient Demographics  Joan [name], is a 56 y.o. female, DOB - February 27, 1960, ZOX:[id number]  Admit date - 09/24/2016   Admitting Physician Elease Etienne, MD  Outpatient Primary MD for the patient is Gwenyth Bender, MD  Outpatient specialists:   LOS - 2  days   Medical records reviewed and are as summarized below:    Chief Complaint  Patient presents with  . Shortness of Breath  . COPD  . Chest Pain       Brief summary   The patient is a 56 year old female who recently moved to [facility] area, with history of COPD, ongoing tobacco use, OSA on nightly CPAP, diabetes presented with worsening dyspnea, wheezing. In ED was noted to be in respirator distress, tachycardia, hypotensive, tachypneic. The patient was given a dose of Solu-Medrol, bronchodilators, placed on BiPAP. She was admitted to stepdown unit.    Assessment & Plan    Principal Problem:   Acute on chronic respiratory failure with hypoxia (HCC) Likely due to COPD exacerbation - Still wheezing, needed BiPAP overnight again, continue IV Solu-Medrol 60 mg IV 8 hours today  - Changed to Xopenex and Atrovent yesterday due to significant tachycardia. Discontinued Brovana.  - She has a lot of anxiety issues, placed on Ativan as needed for now - Recommended patient to quit smoking - Patient reports that she cannot tolerate oral prednisone high doses, will likely send home on 20 mg when she is ready - Home O2 evaluation prior to discharge - Ordered outpatient DME nebulizer machine (broken at home) - Pulmonology consult called, d/w Dr Bary Richard - D-dimer negative, 2-D echo pending, rule out any underlying CHF - DC Zithromax, placed on IV Levaquin  Active Problems:    Tobacco abuse - Counseled strongly on tobacco cessation - Continue nicotine patch    Anxiety -Continue Ativan as needed for anxiety  OSA on CPAP -  Continue CPAP daily at bedtime    DM II (diabetes mellitus, type II), controlled (HCC) - Hemoglobin A1c 5.9. Hyperglycemia likely due to steroids.  - CBG is still uncontrolled, increase Lantus to 12 units, NovoLog increased to 4 units with meals to cover for steroid-induced hyperglycemia   Hypothyroidism - TSH suppressed 0.1, DC Synthroid. 3 T4 0.7 to within normal limits - Repeat TSH, free T4 in 4 weeks  Sinus tachycardia: multifactorial,  patient noted to be on atenolol at home which was held on admission and can cause reflex tachycardia due to BB withdrawal, anxiety, scheduled albuterol treatments, brovana    - TSH suppressed 0.1 and hence her Synthroid discontinued - D-dimer negative, 2-D echo pending  -changed to xopenex q6hrs, DC'ed brovana   - Continue Ativan as needed  Carotid disease - Patient reports that she just moved from [place] and has not established primary care yet. She states that she has a history of carotid disease and wants the carotid ultrasound checked while she is inpatient. She is supposed to see Dr. Willey Blade on 8/13 but does not  want to wait for her PCP appt.   Right groin and leg pain  - Again patient reports that she had a cardiac cath in [place], no stents were placed. Since then she has been having right groin pain radiating to her right leg and wants Doppler of her right groin and leg. I explained to the patient that this can be followed closely by her PCP and she may have sciatica or neuropathic pain. Patient wants the Doppler to be done inpatient while she is here.    Code Status: full code  DVT Prophylaxis:  Lovenox  Family Communication: Discussed in detail with the patient, all imaging results, lab results explained to the patient   Disposition Plan:   Time Spent in minutes  25 minutes  Procedures:  Bipap  Consultants:   Pulmonology  Antimicrobials:      Medications  Scheduled Meds: . atorvastatin  20 mg Oral Daily  . baclofen  10 mg  Oral QHS  . budesonide  1 mg Nebulization BID  . Chlorhexidine Gluconate Cloth  6 each Topical Q0600  . enoxaparin (LOVENOX) injection  40 mg Subcutaneous Q24H  . insulin aspart  0-15 Units Subcutaneous TID WC  . insulin aspart  0-5 Units Subcutaneous QHS  . insulin aspart  4 Units Subcutaneous TID WC  . insulin glargine  12 Units Subcutaneous QHS  . levalbuterol  0.63 mg Nebulization Q6H   And  . ipratropium  0.5 mg Nebulization Q6H  . methylPREDNISolone (SOLU-MEDROL) injection  60 mg Intravenous Q8H  . metoprolol tartrate  25 mg Oral BID  . montelukast  10 mg Oral QHS  . mupirocin ointment  1 application Nasal BID  . nicotine  21 mg Transdermal Daily  . pregabalin  75 mg Oral BID  . roflumilast  500 mcg Oral Daily  . sodium chloride flush  3 mL Intravenous Q12H  . valACYclovir  500 mg Oral Daily  . venlafaxine XR  150 mg Oral Q breakfast   Continuous Infusions: . sodium chloride 10 mL/hr at 09/26/16 0800  . levofloxacin (LEVAQUIN) IV     PRN Meds:.acetaminophen **OR** acetaminophen, diphenhydrAMINE, hydrALAZINE, levalbuterol, LORazepam, ondansetron **OR** ondansetron (ZOFRAN) IV   Antibiotics   Anti-infectives    Start     Dose/Rate Route Frequency Ordered Stop   09/27/16 0900  levofloxacin (LEVAQUIN) IVPB 750 mg     750 mg 100 mL/hr over 90 Minutes Intravenous Every 24 hours 09/27/16 0844     09/25/16 2000  azithromycin (ZITHROMAX) tablet 250 mg  Status:  Discontinued     250 mg Oral Every 24 hours 09/24/16 1831 09/27/16 0844   09/24/16 2115  valACYclovir (VALTREX) tablet 500 mg     500 mg Oral Daily 09/24/16 2101     09/24/16 2000  azithromycin (ZITHROMAX) tablet 500 mg     500 mg Oral Daily 09/24/16 1831 09/24/16 2059        Subjective:   Joan [name] was seen and examined today.Overnight needed BiPAP again, heart rate is improving. Still somewhat anxious and wheezing. Patient denies dizziness, chest pain, abdominal pain, N/V/D/C, new weakness, numbess, tingling.  No fevers or chills.  Objective:   Vitals:   09/27/16 0700 09/27/16 0732 09/27/16 0733 09/27/16 0736  BP: (!) 148/85     Pulse:      Resp:      Temp: (!) 97 F (36.1 C)     TempSrc: Axillary     SpO2:  100% 100% 100%  Weight:  Height:        Intake/Output Summary (Last 24 hours) at 09/27/16 1610 Last data filed at 09/27/16 0400  Gross per 24 hour  Intake           562.99 ml  Output             2200 ml  Net         -1637.01 ml     Wt Readings from Last 3 Encounters:  09/27/16 94.1 kg (207 lb 6.4 oz)  08/09/16 90.7 kg (200 lb)  07/31/13 102.7 kg (226 lb 6 oz)     Exam General: Alert and oriented x 3, NAD, somewhat anxious  Eyes:  HEENT:  Atraumatic, normocephalic, normal oropharynx Cardiovascular: S1 S2 auscultated, sinus tachy No pedal edema b/l Respiratory: Bilateral expiratory wheezing Gastrointestinal: Soft, nontender, nondistended, + bowel sounds Ext: no pedal edema bilaterally Neuro: no new deficits Musculoskeletal: No digital cyanosis, clubbing Skin: No rashes Psych: Normal affect and demeanor, alert and oriented x3      Data Reviewed:  I have personally reviewed following labs and imaging studies  Micro Results Recent Results (from the past 240 hour(s))  MRSA PCR Screening     Status: Abnormal   Collection Time: 09/25/16  6:58 AM  Result Value Ref Range Status   MRSA by PCR POSITIVE (A) NEGATIVE Final    Comment:        The GeneXpert MRSA Assay (FDA approved for NASAL specimens only), is one component of a comprehensive MRSA colonization surveillance program. It is not intended to diagnose MRSA infection nor to guide or monitor treatment for MRSA infections. RESULT CALLED TO, READ BACK BY AND VERIFIED WITH: A. Earlene Plater RN 9:35 09/25/16 ([email or web address])   Culture, sputum-assessment     Status: None   Collection Time: 09/26/16  5:26 PM  Result Value Ref Range Status   Specimen Description EXPECTORATED SPUTUM  Final   Special Requests NONE  Final     Sputum evaluation THIS SPECIMEN IS ACCEPTABLE FOR SPUTUM CULTURE  Final   Report Status 09/27/2016 FINAL  Final  Culture, respiratory (NON-Expectorated)     Status: None (Preliminary result)   Collection Time: 09/26/16  5:26 PM  Result Value Ref Range Status   Specimen Description EXPECTORATED SPUTUM  Final   Special Requests NONE Reflexed from [id number]  Final   Gram Stain   Final    ABUNDANT WBC PRESENT,BOTH PMN AND MONONUCLEAR ABUNDANT GRAM POSITIVE COCCI IN PAIRS RARE GRAM POSITIVE RODS RARE GRAM NEGATIVE RODS    Culture PENDING  Incomplete   Report Status PENDING  Incomplete    Radiology Reports Dg Chest Portable 1 View  Result Date: 09/24/2016 CLINICAL DATA:  Shortness of breath.  History of COPD EXAM: PORTABLE CHEST 1 VIEW COMPARISON:  August 09, 2016 FINDINGS: There is evidence of upper lobe bullous disease. There is no edema or consolidation. Relative prominence of the interstitium in the lung bases is probably due to redistribution of blood flow to viable lung segments. Heart size is within normal limits. Pulmonary vascularity reflects upper lobe bullous disease. No adenopathy. No bone lesions. IMPRESSION: Bilateral upper lobe bullous disease, stable. No edema or consolidation. Stable cardiac silhouette. Electronically Signed   By: Bretta Bang III M.D.   On: 09/24/2016 16:30    Lab Data:  CBC:  Recent Labs Lab 09/24/16 1108 09/26/16 0233 09/27/16 0251  WBC 12.5* 24.8* 23.5*  HGB 14.4 13.9 14.1  HCT 42.3 41.8 42.1  MCV 97.0 97.2 98.6  PLT  280 279 284   Basic Metabolic Panel:  Recent Labs Lab 09/24/16 1108 09/26/16 0233 09/27/16 0251  NA 142 140 140  K 4.3 4.4 4.6  CL 107 105 104  CO2 28 24 27   GLUCOSE 105* 292* 217*  BUN 14 22* 29*  CREATININE 0.88 0.97 0.91  CALCIUM 9.2 9.0 9.2   GFR: Estimated Creatinine Clearance: 84.8 mL/min (by C-G formula based on SCr of 0.91 mg/dL). Liver Function Tests: No results for input(s): AST, ALT, ALKPHOS, BILITOT,  PROT, ALBUMIN in the last 168 hours. No results for input(s): LIPASE, AMYLASE in the last 168 hours. No results for input(s): AMMONIA in the last 168 hours. Coagulation Profile: No results for input(s): INR, PROTIME in the last 168 hours. Cardiac Enzymes: No results for input(s): CKTOTAL, CKMB, CKMBINDEX, TROPONINI in the last 168 hours. BNP (last 3 results) No results for input(s): PROBNP in the last 8760 hours. HbA1C:  Recent Labs  09/24/16 2118  HGBA1C 5.9*   CBG:  Recent Labs Lab 09/26/16 0753 09/26/16 1212 09/26/16 1558 09/26/16 2129 09/27/16 0747  GLUCAP 269* 229* 248* 103* 226*   Lipid Profile: No results for input(s): CHOL, HDL, LDLCALC, TRIG, CHOLHDL, LDLDIRECT in the last 72 hours. Thyroid Function Tests:  Recent Labs  09/26/16 1845 09/27/16 0653  TSH 0.107*  --   FREET4  --  0.72   Anemia Panel: No results for input(s): VITAMINB12, FOLATE, FERRITIN, TIBC, IRON, RETICCTPCT in the last 72 hours. Urine analysis:    Component Value Date/Time   COLORURINE YELLOW 07/31/2013 2237   APPEARANCEUR CLEAR 07/31/2013 2237   LABSPEC 1.006 07/31/2013 2237   PHURINE 6.5 07/31/2013 2237   GLUCOSEU NEGATIVE 07/31/2013 2237   HGBUR NEGATIVE 07/31/2013 2237   BILIRUBINUR NEGATIVE 07/31/2013 2237   KETONESUR NEGATIVE 07/31/2013 2237   PROTEINUR NEGATIVE 07/31/2013 2237   UROBILINOGEN 0.2 07/31/2013 2237   NITRITE NEGATIVE 07/31/2013 2237   LEUKOCYTESUR NEGATIVE 07/31/2013 2237     Jery Hollern M.D. [facility] 09/27/2016, 9:09 AM  Pager: [phone] Between 7am to 7pm - call Pager - [phone]  After 7pm go to [email or web address] - password [email or web address]  Call night coverage person covering after 7pm

## 2016-09-27 NOTE — Progress Notes (Signed)
PT Cancellation Note  Patient Details Name: Joan [name] MRN: [id number] DOB: Oct 26, 1960   Cancelled Treatment:    Reason Eval/Treat Not Completed: Patient's level of consciousness Pt fast asleep. Attempted to arouse pt however she would answer a few questions and then fall right back asleep. Will follow up as time allows.   Marguarite Arbour A Hayleen Clinkscales 09/27/2016, 10:03 AM Wray Kearns, PT, DPT [phone]

## 2016-09-27 NOTE — Evaluation (Signed)
Physical Therapy Evaluation Patient Details Name: Joan [name] MRN: [id number] DOB: 1960-07-29 Today's Date: 09/27/2016   History of Present Illness  Patient is a 56 y/o female who presents with dyspnea. CXR- upper love bullous disease. Admitted with COPD exacerbation and acute respiratory failure. PMH includes COPD, DM, Hep C, OSA on CPAP, depression, bipolar disordera, anxiety.   Clinical Impression  Patient presents with generalized weakness, decreased cardiovascular endurance, dyspnea on exertion and impaired mobility s/p above. Pt recently moved to live with brother and is mod I for ADLs but it takes her a long time due to SOB and fatigue. Uses rollator vs pushes w/c for mobility. Sp02 remained >95% on 1L/min 02 during mobility. Tolerated gait training with min guard assist for safety with 4/4 DOE. Eager to mobilize. Will follow acutely to maximize independence and mobility prior to return home.     Follow Up Recommendations Home health PT;Supervision for mobility/OOB    Equipment Recommendations  None recommended by PT    Recommendations for Other Services OT consult     Precautions / Restrictions Restrictions Weight Bearing Restrictions: No      Mobility  Bed Mobility Overal bed mobility: Needs Assistance Bed Mobility: Supine to Sit;Sit to Supine     Supine to sit: Modified independent (Device/Increase time);HOB elevated Sit to supine: Modified independent (Device/Increase time);HOB elevated   General bed mobility comments: No assist needed to get out/into bed.   Transfers Overall transfer level: Needs assistance Equipment used: None Transfers: Sit to/from Stand Sit to Stand: Supervision         General transfer comment: Supervision for safety. Stood from [employer], from toilet x1.  Ambulation/Gait Ambulation/Gait assistance: Min guard Ambulation Distance (Feet): 150 Feet Assistive device: None Gait Pattern/deviations: Step-through pattern;Decreased stride  length;Decreased step length - right;Decreased step length - left;Wide base of support Gait velocity: decreased Gait velocity interpretation: Below normal speed for age/gender General Gait Details: Very slow, mildly unsteady gait holding water cup per request. Sp02 remained >94% on 1L/min 02. 4/4 DOE. HR stable in low 100s bpm.  Stairs            Wheelchair Mobility    Modified Rankin (Stroke Patients Only)       Balance Overall balance assessment: Needs assistance Sitting-balance support: Feet supported;No upper extremity supported Sitting balance-Leahy Scale: Good Sitting balance - Comments: Able to perform pericare without difficulty.    Standing balance support: During functional activity Standing balance-Leahy Scale: Fair                               Pertinent Vitals/Pain Pain Assessment: No/denies pain    Home Living Family/patient expects to be discharged to:: Private residence Living Arrangements: Other relatives (brother) Available Help at Discharge: Family;Available PRN/intermittently Type of Home: House Home Access: Stairs to enter Entrance Stairs-Rails: Right Entrance Stairs-Number of Steps: 8-10 Home Layout: One level Home Equipment: Walker - 4 wheels;Wheelchair - manual;Shower seat      Prior Function Level of Independence: Independent with assistive device(s)         Comments: Furniture walker for household distances and pushes w/c for community ambulation. Drives. DOes ADLs but it takes along time due to SOB. No 02 at home.      Hand Dominance        Extremity/Trunk Assessment   Upper Extremity Assessment Upper Extremity Assessment: Defer to OT evaluation    Lower Extremity Assessment Lower Extremity Assessment: Generalized weakness  Communication   Communication:  (difficulty talking and walking due to dyspnea.)  Cognition Arousal/Alertness: Awake/alert Behavior During Therapy: WFL for tasks  assessed/performed Overall Cognitive Status: Within Functional Limits for tasks assessed                                        General Comments      Exercises     Assessment/Plan    PT Assessment Patient needs continued PT services  PT Problem List Decreased strength;Decreased mobility;Decreased balance;Decreased activity tolerance;Cardiopulmonary status limiting activity       PT Treatment Interventions Therapeutic activities;Gait training;Therapeutic exercise;Patient/family education;Balance training;Functional mobility training;Stair training    PT Goals (Current goals can be found in the Care Plan section)  Acute Rehab PT Goals Patient Stated Goal: to be able to breathe and walk PT Goal Formulation: With patient Time For Goal Achievement: 10/11/16 Potential to Achieve Goals: Good    Frequency Min 3X/week   Barriers to discharge Decreased caregiver support lives with brother who works; stairs    Co-evaluation               AM-PAC PT "6 Clicks" Daily Activity  Outcome Measure Difficulty turning over in bed (including adjusting bedclothes, sheets and blankets)?: None Difficulty moving from lying on back to sitting on the side of the bed? : None Difficulty sitting down on and standing up from a chair with arms (e.g., wheelchair, bedside commode, etc,.)?: None Help needed moving to and from a bed to chair (including a wheelchair)?: None Help needed walking in hospital room?: A Little Help needed climbing 3-5 steps with a railing? : A Little 6 Click Score: 22    End of Session Equipment Utilized During Treatment: Oxygen Activity Tolerance: Patient limited by fatigue;Patient tolerated treatment well Patient left: in bed;with call bell/phone within reach;Other (comment) (xray tech in room) Nurse Communication: Mobility status PT Visit Diagnosis: Other (comment);Unsteadiness on feet (R26.81);Muscle weakness (generalized) (M62.81);Difficulty in  walking, not elsewhere classified (R26.2) (DOE)    Time: 2863-8177 PT Time Calculation (min) (ACUTE ONLY): 26 min   Charges:   PT Evaluation $PT Eval Moderate Complexity: 1 Mod PT Treatments $Gait Training: 8-22 mins   PT G Codes:        Wray Kearns, PT, DPT [phone]    Pole Ojea 09/27/2016, 3:50 PM

## 2016-09-27 NOTE — Progress Notes (Signed)
  Echocardiogram 2D Echocardiogram has been performed.  Jennette Dubin 09/27/2016, 4:04 PM

## 2016-09-27 NOTE — Progress Notes (Signed)
Patient able to ambulate down the hall with PT today with 2L of O2 and to bathroom without difficulty. Patient did well with 1 mg Ativan 2x's today for anxiety.

## 2016-09-27 NOTE — Consult Note (Signed)
Name: Joan [name] MRN: [id number] DOB: [birth year]    ADMISSION DATE:  09/24/2016 CONSULTATION DATE:  09/27/2016  REFERRING MD :  Dr. Tana Coast  CHIEF COMPLAINT:  COPD exacerbation, establishment of pulmonary care.  HISTORY OF PRESENT ILLNESS:   11 yoF with PMH of COPD, ongoing tobacco abuse, OSA on CPAP, VCD, anxiety, depression, DM, Hep C, thyroid disease and "heart-lung blood flow issue" admitted on 7/31 to [facility] on SDU with dyspnea.  Patient was widowed 5 years ago and since reports she has been traveling the [employer] friends.  Recently staying with her brother in [place] since June.  Reports last seeing a Pulmonologist "years" ago along with PFTs and sleep study many years ago.  She is a current smoker since the age of 20, smoking up to 2PPD at her heaviest but recently trying to wean down, currently to half to 1PPD.  Prior exposure to smoking marijuana and crack cocaine 5 yrs ago.  She uses a wheelchair or motorized scooter- for several years now, to get around while shopping due to her chronic dyspnea.  Denies home O2 requirements. States is compliant with her home CPAP.  Reports her brother had a URI prior to her becoming sick.  She started to feel feverish with hot flashes with productive cough- with change to lime-green sputum from her baseline of thick yellow to white , dyspnea, and pleuritic chest pain with coughing about a week ago.  She started taking some oral prednisone from a prior prescription and using home nebulizer without much improvement until her neb machine broke the day before she presented to the hospital.  She was admitted 7/31 with acute COPD exacerbation and acute hypoxic respiratory failure with room air sats in mid 80's treated with nebs, steroids, intermittent BiPAP and empiric zithromax.  She has had ongoing anxiety, possibly related to steroids +/- albuterol and continues to have ongoing wheezing and BiPAP needs.  PCCM to evaluate.    PAST MEDICAL HISTORY :     has a past medical history of Anxiety; Arthritis; Asthma; Bipolar disorder ([place]); Chronic bronchitis ([place]); Chronic leg pain; Chronic lower back pain; COPD (chronic obstructive pulmonary disease) ([place]); Depression; Emphysema of lung ([place]); Hepatitis C; High cholesterol; Hypothyroidism; OSA on CPAP; Pneumonia; Thyroid disease; and Type II diabetes mellitus ([place]).  has a past surgical history that includes Foot surgery (Bilateral); Upper gi endoscopy; Colonoscopy; Hernia repair; Umbilical hernia repair (~ 2013); Incision and drainage (~ 2014/2015); Lipoma excision (Right); Posterior lumbar fusion (2015/2016 X 2); Back surgery; Laceration repair (Right); Nasal septum surgery; Cyst excision; Tubal ligation; Dilation and curettage of uterus; and Cardiac catheterization.   Prior to Admission medications   Medication Sig Start Date End Date Taking? Authorizing Provider  albuterol (PROVENTIL) (2.5 MG/3ML) 0.083% nebulizer solution Take 3 mLs by nebulization every 6 (six) hours as needed for wheezing. 08/05/16  Yes [provider]  atenolol (TENORMIN) 25 MG tablet Take by mouth daily.   Yes [provider]  atorvastatin (LIPITOR) 20 MG tablet Take 20 mg by mouth daily.   Yes [provider]  baclofen (LIORESAL) 10 MG tablet Take 10 mg by mouth at bedtime.   Yes [provider]  budesonide (PULMICORT) 1 MG/2ML nebulizer solution Take 1 mg by nebulization 4 (four) times daily.   Yes [provider]  budesonide-formoterol (SYMBICORT) 160-4.5 MCG/ACT inhaler Inhale 2 puffs into the lungs 2 (two) times daily.   Yes [provider]  formoterol (PERFOROMIST) 20 MCG/2ML nebulizer solution Take 20  mcg by nebulization 2 (two) times daily.   Yes [provider]  levothyroxine (SYNTHROID, LEVOTHROID) 25 MCG tablet Take 25 mcg by mouth daily before breakfast.   Yes [provider]  lisinopril (PRINIVIL,ZESTRIL) 10 MG tablet Take 10 mg by mouth daily.    Yes [provider]  montelukast (SINGULAIR) 10 MG tablet Take 10 mg by mouth at bedtime.   Yes [provider]  naproxen (NAPROSYN) 500 MG tablet Take 500-1,000 mg by mouth 2 (two) times daily as needed for mild pain.   Yes [provider]  pregabalin (LYRICA) 75 MG capsule Take 75 mg by mouth 2 (two) times daily.   Yes [provider]  roflumilast (DALIRESP) 500 MCG TABS tablet Take 500 mcg by mouth daily.   Yes [provider]  tiotropium (SPIRIVA) 18 MCG inhalation capsule Place 18 mcg into inhaler and inhale daily.   Yes [provider]  valACYclovir (VALTREX) 500 MG tablet Take 500 mg by mouth daily.    Yes [provider]  venlafaxine XR (EFFEXOR-XR) 150 MG 24 hr capsule Take 150 mg by mouth daily with breakfast.   Yes [provider]   Allergies  Allergen Reactions  . Dilaudid [Hydromorphone Hcl] Other (See Comments)    "heavy sedation"  . Iodine Other (See Comments)    Stomach cramps  . Keflex [Cephalexin] Other (See Comments)    unspecified  . Prednisone Other (See Comments)    "counter reacts"  . Shellfish Allergy Other (See Comments)    Stomach cramps  . Tetracyclines & Related Other (See Comments)    unspecified    FAMILY HISTORY:  family history includes Cancer in her father; Diabetes in her mother; Hypertension in her father and mother. SOCIAL HISTORY:  reports that she has been smoking Cigarettes.  She has a 44.00 pack-year smoking history. She has never used smokeless tobacco. She reports that she drinks alcohol. She reports that she uses drugs, including "Crack" cocaine.  REVIEW OF SYSTEMS:  POSITIVES in BOLD Constitutional: Negative for hot flashes, fever, chills, weight loss, malaise/fatigue and diaphoresis.  HENT: Negative for hearing loss, ear pain, nosebleeds, congestion, sore throat, neck pain, tinnitus and ear discharge.   Eyes: Negative for blurred vision, double vision, photophobia,  pain, discharge and redness.  Respiratory: Negative for productive cough, hemoptysis, sputum production, shortness of breath, wheezing and stridor.   Cardiovascular: Negative for chest pain, palpitations, orthopnea, claudication, leg swelling and PND.  Gastrointestinal: Negative for heartburn, nausea, vomiting, abdominal pain, diarrhea, constipation, blood in stool and melena.  Genitourinary: Negative for dysuria, urgency, frequency, hematuria and flank pain.  Musculoskeletal: Negative for myalgias, back pain, joint pain and falls.  Skin: Negative for itching and rash.  Neurological: Negative for dizziness, tingling, tremors, sensory change, speech change, focal weakness, seizures, loss of consciousness, weakness and headaches.  Endo/Heme/Allergies: Negative for environmental allergies and polydipsia. Does not bruise/bleed easily.  SUBJECTIVE:  She does not like BiPAP, overall feels and breathing better. RN reports anxiety precedes her SOB attacks, better after ativan  VITAL SIGNS: Temp:  [97 F (36.1 C)-98.3 F (36.8 C)] 97.5 F (36.4 C) (08/03 1150) Pulse Rate:  [87-118] 95 (08/03 1014) Resp:  [22-33] 33 (08/03 0300) BP: (128-158)/(69-97) 147/97 (08/03 1150) SpO2:  [99 %-100 %] 100 % (08/03 0736) FiO2 (%):  [40 %] 40 % (08/03 0736) Weight:  [207 lb 6.4 oz (94.1 kg)] 207 lb 6.4 oz (94.1 kg) (08/03 0439)  PHYSICAL EXAMINATION: General:  Older than stated age  woman sitting in bed, anxious, otherwise no distress HEENT: MM pink/moist, PERRL, raspy voice, intermittent transmitted upper airway wheezes PSY: anxious, very talkative Neuro: AOx4, MAE, nonfocal CV: s1s2 rrr, no murmur PULM: even/non-labored, able to speak without difficulty, tachypnic at times, lungs bilaterally diminished throughout with faint prolonged wheeze, 100% on 3L [place] GI: obese, soft, non-tender, bs active  Extremities: warm/dry, no edema  Skin: scattered bruising to arms   Recent Labs Lab 09/24/16 1108  09/26/16 0233 09/27/16 0251  NA 142 140 140  K 4.3 4.4 4.6  CL 107 105 104  CO2 28 24 27   BUN 14 22* 29*  CREATININE 0.88 0.97 0.91  GLUCOSE 105* 292* 217*    Recent Labs Lab 09/24/16 1108 09/26/16 0233 09/27/16 0251  HGB 14.4 13.9 14.1  HCT 42.3 41.8 42.1  WBC 12.5* 24.8* 23.5*  PLT 280 279 284   No results found.   ASSESSMENT / PLAN:  COPD with exacerbation Acute hypoxic/hypercapnic respiratory failure OSA on CPAP Hx of VCD- per patient  Tobacco abuse  Anxiety - on home symbicort, Spiriva, roflumilast, singulair, and budesonide/ formoterol nebs  (this is double dosing- either needs nebs or inhaler but not both) - CXR 7/31 with bilateral upper lobe bullous disease, no consolidation - afebrile - leukocytosis - increase r/t ?steroid induced - did not tolerate albuterol or brovonna d/t tachycardia/anxiety - d-dimer neg  P:  Continue CPAP qHS and while napping D/c BiPAP  Wean for FiO2 > 88-94% CXR 2 view today Continue roflumilast Changed to brovanna and budesonide 0.5mg  BID nebs again to see if she tolerates them (she uses these at home without reported adverse symptoms), tachycardia/ wheezing may be anxiety/panic attack driven vs VCD Continue xopenex and atrovent nebs q 6 and prn xopenex  D/c solumedrol, change to decadron 4mg  q 6 hr x 4 doses to see if that helps with ? VCD driven wheezing Sputum cx pending Check PCT If PCT is reassuring, would d/c levaquin  PPI q 12hr given VCD/ steroid use Pulmonary hygiene with IS Ativan prn severe anxiety TTE pending Ongoing tobacco cessation, patient interested in quitting, currently with nicotine patch, asking for chantex CM for DME neb machine per primary Will need O2 assessment prior to discharge Will establish follow-up appt in Pulmonary office prior to d/c to establish care, repeat PFT's, pulmonary rehab   Remainder per primary team:  T2DM HTN Chronic low back pain   Kennieth Rad, AGACNP-BC [place]  [facility] Pgr: [phone] or if no answer [phone] 09/27/2016, 2:07 PM   STAFF NOTE: I, Merrie Roof, MD FACP have personally reviewed patient's available data, including medical history, events of note, physical examination and test results as part of my evaluation. I have discussed with resident/NP and other care providers such as pharmacist, RN and RRT. In addition, I personally evaluated patient and elicited key findings of: awake, alert, anxious, no distress, has clear cut upper airway wheezing radiating to upper air filed's and hoarse voice, pcxr last done I reviewed showed bibasilar ATX, her wheezing from lung is midl, most impressed for upper airway VCD, she descrobes this issue in past, her anxiety is driving all of her asking for nIMV, she does NOT need daytime NIMV, would use her baseline cpap nocturnal, change solumedral to decadron, she also complains about gerd, which may be a factor for VCD, add aggressive ppi, will follow up Monday, call if needed, we will repeat her pcxr today also, assess pct and using new fda claim for  pct for lrti will dc all abx if less 0.25, she does not appear infected, ABG reviewed last, no repeat needed, need primary to address anxiety    Lavon Paganini. Titus Mould, MD, [place] Pgr: [place] [facility] 09/27/2016 2:54 PM

## 2016-09-27 NOTE — Progress Notes (Addendum)
  Vascular lab preliminary.  Carotid Duplex: Bilateral: No significant (1-39%) ICA stenosis. Antegrade vertebral flow.   Right groin venous duplex: no evidence of DVT.     Landry Mellow, RDMS, RVT 09/27/2016

## 2016-09-28 DIAGNOSIS — J441 Chronic obstructive pulmonary disease with (acute) exacerbation: Principal | ICD-10-CM

## 2016-09-28 DIAGNOSIS — J383 Other diseases of vocal cords: Secondary | ICD-10-CM

## 2016-09-28 LAB — VAS US CAROTID
LEFT ECA DIAS: 11 cm/s
LEFT VERTEBRAL DIAS: 14 cm/s
Left CCA dist dias: 27 cm/s
Left CCA dist sys: 87 cm/s
Left CCA prox dias: 23 cm/s
Left CCA prox sys: 100 cm/s
Left ICA dist dias: -23 cm/s
Left ICA dist sys: -71 cm/s
Left ICA prox dias: -13 cm/s
Left ICA prox sys: -55 cm/s
RIGHT ECA DIAS: -10 cm/s
RIGHT VERTEBRAL DIAS: 13 cm/s
Right CCA prox dias: 20 cm/s
Right CCA prox sys: 73 cm/s
Right cca dist sys: -60 cm/s

## 2016-09-28 LAB — BASIC METABOLIC PANEL
Anion gap: 10 (ref 5–15)
BUN: 33 mg/dL — ABNORMAL HIGH (ref 6–20)
CO2: 26 mmol/L (ref 22–32)
Calcium: 9 mg/dL (ref 8.9–10.3)
Chloride: 103 mmol/L (ref 101–111)
Creatinine, Ser: 1.02 mg/dL — ABNORMAL HIGH (ref 0.44–1.00)
GFR calc Af Amer: >60 mL/min (ref >60)
GFR calc non Af Amer: >60 mL/min (ref >60)
Glucose, Bld: 202 mg/dL — ABNORMAL HIGH (ref 65–99)
Potassium: 4.5 mmol/L (ref 3.5–5.1)
Sodium: 139 mmol/L (ref 135–145)

## 2016-09-28 LAB — GLUCOSE, CAPILLARY
Glucose-Capillary: 184 mg/dL — ABNORMAL HIGH (ref 65–99)
Glucose-Capillary: 114 mg/dL — ABNORMAL HIGH (ref 65–99)
Glucose-Capillary: 110 mg/dL — ABNORMAL HIGH (ref 65–99)
Glucose-Capillary: 172 mg/dL — ABNORMAL HIGH (ref 65–99)

## 2016-09-28 LAB — T3: T3, Total: 65 ng/dL — ABNORMAL LOW (ref 71–180)

## 2016-09-28 LAB — CBC
HCT: 44.6 % (ref 36.0–46.0)
Hemoglobin: 14.2 g/dL (ref 12.0–15.0)
MCH: 31.6 pg (ref 26.0–34.0)
MCHC: 31.8 g/dL (ref 30.0–36.0)
MCV: 99.1 fL (ref 78.0–100.0)
Platelets: 316 10*3/uL (ref 150–400)
RBC: 4.5 MIL/uL (ref 3.87–5.11)
RDW: 13.8 % (ref 11.5–15.5)
WBC: 19.5 10*3/uL — ABNORMAL HIGH (ref 4.0–10.5)

## 2016-09-28 MED ORDER — LIVING WELL WITH DIABETES BOOK
Freq: Once | Status: AC
  Administered 2016-09-28: 13:00:00
  Filled 2016-09-28: qty 1

## 2016-09-28 NOTE — Progress Notes (Signed)
Called nurse where 2 consults for pt were put in to create advanced directive today. Advised her, who will let pt know, that can't be done till Monday when volunteers are present. Chaplain available Monday for that purpose.   09/28/16 1500  Clinical Encounter Type  Visited With Health care provider  Visit Type Initial  Referral From Nurse   Gerrit Heck, Chaplain

## 2016-09-28 NOTE — Progress Notes (Signed)
[facility]                                                                              Patient Demographics  Joan [name], is a 56 y.o. female, DOB - 05/04/1960, GLO:[id number]  Admit date - 09/24/2016   Admitting Physician Modena Jansky, MD  Outpatient Primary MD for the patient is Rogers Blocker, MD  Outpatient specialists:   LOS - 3  days   Medical records reviewed and are as summarized below:    Chief Complaint  Patient presents with  . Shortness of Breath  . COPD  . Chest Pain       Brief summary   The patient is a 55 year old female who recently moved to [facility] area, with history of COPD, ongoing tobacco use, OSA on nightly CPAP, diabetes presented with worsening dyspnea, wheezing. In ED was noted to be in respirator distress, tachycardia, hypotensive, tachypneic. The patient was given a dose of Solu-Medrol, bronchodilators, placed on BiPAP. She was admitted to stepdown unit.    Assessment & Plan    Principal Problem:   Acute on chronic respiratory failure with hypoxia (HCC) Likely due to COPD exacerbation - Patient was admitted with worsening dyspnea, wheezing, needed BiPAP. She was placed on scheduled bronchodilators, IV Solu-Medrol with no significant improvement hence pulmonology was consulted. - Feeling somewhat improving, appreciate pulmonology recommendations - BiPAP discontinued, continue CPAP at night - Pro calcitonin less than 0.1, Levaquin discontinued by CCM - Placed on PPI IV every 12 hours, due to VCD, steroid use - P CCM recommended Decadron instead of Solu-Medrol.  - Recommended patient to quit smoking - Home O2 evaluation prior to discharge - Ordered outpatient DME nebulizer machine (broken at home), case management to assist - D-dimer negative. 2-D echo showed EF of 55-60% with grade 1 diastolic dysfunction  Active Problems:    Tobacco abuse - Counseled strongly on tobacco cessation - Continue nicotine patch   Anxiety -Continue Ativan as needed for anxiety  OSA on CPAP - Continue CPAP daily at bedtime    DM II (diabetes mellitus, type II), controlled (HCC) - Hemoglobin A1c 5.9. Hyperglycemia likely due to steroids.  - CBGs better controlled, continue current regimen of Lantus 12 units, NovoLog 4 units with meals  Hypothyroidism - TSH suppressed 0.1, DC Synthroid. 3 T4 0.7 to within normal limits - Repeat TSH, free T4 in 4 weeks  Sinus tachycardia: multifactorial,  patient noted to be on atenolol at home which was held on admission and can cause reflex tachycardia due to BB withdrawal, anxiety, scheduled albuterol treatments, brovana    - Now significantly improved, 2-D echo assuring. EF 55-60%.  - TSH suppressed 0.1 and hence her Synthroid discontinued - D-dimer negative, continue Xopenex, Ativan as needed   ? Carotid disease - Patient reports that she just moved from [place] and has not established primary care yet. She states that she has a history of carotid disease and wants the carotid ultrasound checked while she is inpatient. She is supposed to see Dr. Kevan Ny on 8/13 but does not want to wait for her PCP appt.  - Carotid ultrasound showed  1-39% ICA stenosis  Right groin and leg pain  - Again patient reports that she had a cardiac cath in [place], no stents were placed. Since then she has been having right groin pain radiating to her right leg and wants Doppler of her right groin and leg. I explained to the patient that this can be followed closely by her PCP and she may have sciatica or neuropathic pain. Patient wants the Doppler to be done inpatient while she is here.  - Doppler ultrasound negative for any DVT   Code Status: full code  DVT Prophylaxis:  Lovenox  Family Communication: Discussed in detail with the patient, all imaging results, lab results explained to the patient   Disposition Plan: Possible DC next 24-48 hours  Time Spent in minutes  25 minutes  Procedures:    Bipap  Consultants:   Pulmonology  Antimicrobials:      Medications  Scheduled Meds: . arformoterol  15 mcg Nebulization BID  . atorvastatin  20 mg Oral Daily  . baclofen  10 mg Oral QHS  . budesonide (PULMICORT) nebulizer solution  0.5 mg Nebulization BID  . Chlorhexidine Gluconate Cloth  6 each Topical Q0600  . enoxaparin (LOVENOX) injection  40 mg Subcutaneous Q24H  . insulin aspart  0-15 Units Subcutaneous TID WC  . insulin aspart  0-5 Units Subcutaneous QHS  . insulin aspart  4 Units Subcutaneous TID WC  . insulin glargine  12 Units Subcutaneous QHS  . levalbuterol  0.63 mg Nebulization Q6H   And  . ipratropium  0.5 mg Nebulization Q6H  . metoprolol tartrate  25 mg Oral BID  . montelukast  10 mg Oral QHS  . mupirocin ointment  1 application Nasal BID  . nicotine  21 mg Transdermal Daily  . pantoprazole (PROTONIX) IV  40 mg Intravenous Q12H  . pregabalin  75 mg Oral BID  . roflumilast  500 mcg Oral Daily  . sodium chloride flush  3 mL Intravenous Q12H  . valACYclovir  500 mg Oral Daily  . venlafaxine XR  150 mg Oral Q breakfast   Continuous Infusions: . sodium chloride 10 mL/hr at 09/26/16 0800  . levofloxacin (LEVAQUIN) IV Stopped (09/28/16 1008)   PRN Meds:.acetaminophen **OR** acetaminophen, diphenhydrAMINE, hydrALAZINE, levalbuterol, LORazepam, ondansetron **OR** ondansetron (ZOFRAN) IV   Antibiotics   Anti-infectives    Start     Dose/Rate Route Frequency Ordered Stop   09/27/16 0900  levofloxacin (LEVAQUIN) IVPB 750 mg     750 mg 100 mL/hr over 90 Minutes Intravenous Every 24 hours 09/27/16 0844     09/25/16 2000  azithromycin (ZITHROMAX) tablet 250 mg  Status:  Discontinued     250 mg Oral Every 24 hours 09/24/16 1831 09/27/16 0844   09/24/16 2115  valACYclovir (VALTREX) tablet 500 mg     500 mg Oral Daily 09/24/16 2101     09/24/16 2000  azithromycin (ZITHROMAX) tablet 500 mg     500 mg Oral Daily 09/24/16 1831 09/24/16 2059         Subjective:   Joan [name] was seen and examined today.Feeling better this morning, wheezing improving. No chest pain, fevers or chills. Shortness of breath improving. Heart rate is much better controlled.  Patient denies dizziness, chest pain, abdominal pain, N/V/D/C, new weakness, numbess, tingling. No fevers or chills.  Objective:   Vitals:   09/28/16 0729 09/28/16 0738 09/28/16 1130 09/28/16 1142  BP:   134/74   Pulse:   79 78  Resp:   Marland Kitchen)  28 20  Temp:   (!) 97.5 F (36.4 C)   TempSrc:   Oral   SpO2: 93% 93% 98% 97%  Weight:      Height:        Intake/Output Summary (Last 24 hours) at 09/28/16 1401 Last data filed at 09/28/16 0705  Gross per 24 hour  Intake          1030.83 ml  Output             2525 ml  Net         -1494.17 ml     Wt Readings from Last 3 Encounters:  09/28/16 93.8 kg (206 lb 14.4 oz)  08/09/16 90.7 kg (200 lb)  07/31/13 102.7 kg (226 lb 6 oz)     Exam Physical Exam General: Alert and oriented x 3, NAD Eyes:  HEENT:   Cardiovascular: S1 S2 auscultated, no rubs, murmurs or gallops. Regular rate and rhythm. No pedal edema b/l Respiratory: Mild expiratory wheezing bilaterally Gastrointestinal: Soft, nontender, nondistended, + bowel sounds Ext: no pedal edema bilaterally Neuro: no new deficits Musculoskeletal: No digital cyanosis, clubbing Skin: No rashes Psych: Normal affect and demeanor, alert and oriented x3      Data Reviewed:  I have personally reviewed following labs and imaging studies  Micro Results Recent Results (from the past 240 hour(s))  MRSA PCR Screening     Status: Abnormal   Collection Time: 09/25/16  6:58 AM  Result Value Ref Range Status   MRSA by PCR POSITIVE (A) NEGATIVE Final    Comment:        The GeneXpert MRSA Assay (FDA approved for NASAL specimens only), is one component of a comprehensive MRSA colonization surveillance program. It is not intended to diagnose MRSA infection nor to guide  or monitor treatment for MRSA infections. RESULT CALLED TO, READ BACK BY AND VERIFIED WITH: A. Rosana Hoes RN 9:35 09/25/16 ([email or web address])   Culture, sputum-assessment     Status: None   Collection Time: 09/26/16  5:26 PM  Result Value Ref Range Status   Specimen Description EXPECTORATED SPUTUM  Final   Special Requests NONE  Final   Sputum evaluation THIS SPECIMEN IS ACCEPTABLE FOR SPUTUM CULTURE  Final   Report Status 09/27/2016 FINAL  Final  Culture, respiratory (NON-Expectorated)     Status: None (Preliminary result)   Collection Time: 09/26/16  5:26 PM  Result Value Ref Range Status   Specimen Description EXPECTORATED SPUTUM  Final   Special Requests NONE Reflexed from [id number]  Final   Gram Stain   Final    ABUNDANT WBC PRESENT,BOTH PMN AND MONONUCLEAR ABUNDANT GRAM POSITIVE COCCI IN PAIRS RARE GRAM POSITIVE RODS RARE GRAM NEGATIVE RODS    Culture PENDING  Incomplete   Report Status PENDING  Incomplete    Radiology Reports Dg Chest Port 1 View  Result Date: 09/27/2016 CLINICAL DATA:  56 y/o  F; wheezing. EXAM: PORTABLE CHEST 1 VIEW COMPARISON:  09/24/2016 chest radiograph. FINDINGS: Stable cardiac silhouette within normal limits. Upper lobe emphysema is stable. No consolidation, effusion, or pneumothorax. No acute osseous abnormality is evident. IMPRESSION: Stable upper lobe emphysema.  No acute pulmonary process identified. Electronically Signed   By: Kristine Garbe M.D.   On: 09/27/2016 15:58   Dg Chest Portable 1 View  Result Date: 09/24/2016 CLINICAL DATA:  Shortness of breath.  History of COPD EXAM: PORTABLE CHEST 1 VIEW COMPARISON:  August 09, 2016 FINDINGS: There is evidence of upper lobe bullous disease. There is no  edema or consolidation. Relative prominence of the interstitium in the lung bases is probably due to redistribution of blood flow to viable lung segments. Heart size is within normal limits. Pulmonary vascularity reflects upper lobe bullous disease. No adenopathy.  No bone lesions. IMPRESSION: Bilateral upper lobe bullous disease, stable. No edema or consolidation. Stable cardiac silhouette. Electronically Signed   By: Lowella Grip III M.D.   On: 09/24/2016 16:30    Lab Data:  CBC:  Recent Labs Lab 09/24/16 1108 09/26/16 0233 09/27/16 0251 09/28/16 0303  WBC 12.5* 24.8* 23.5* 19.5*  HGB 14.4 13.9 14.1 14.2  HCT 42.3 41.8 42.1 44.6  MCV 97.0 97.2 98.6 99.1  PLT 280 279 284 454   Basic Metabolic Panel:  Recent Labs Lab 09/24/16 1108 09/26/16 0233 09/27/16 0251 09/28/16 0303  NA 142 140 140 139  K 4.3 4.4 4.6 4.5  CL 107 105 104 103  CO2 28 24 27 26   GLUCOSE 105* 292* 217* 202*  BUN 14 22* 29* 33*  CREATININE 0.88 0.97 0.91 1.02*  CALCIUM 9.2 9.0 9.2 9.0   GFR: Estimated Creatinine Clearance: 75.5 mL/min (A) (by C-G formula based on SCr of 1.02 mg/dL (H)). Liver Function Tests: No results for input(s): AST, ALT, ALKPHOS, BILITOT, PROT, ALBUMIN in the last 168 hours. No results for input(s): LIPASE, AMYLASE in the last 168 hours. No results for input(s): AMMONIA in the last 168 hours. Coagulation Profile: No results for input(s): INR, PROTIME in the last 168 hours. Cardiac Enzymes: No results for input(s): CKTOTAL, CKMB, CKMBINDEX, TROPONINI in the last 168 hours. BNP (last 3 results) No results for input(s): PROBNP in the last 8760 hours. HbA1C: No results for input(s): HGBA1C in the last 72 hours. CBG:  Recent Labs Lab 09/27/16 1154 09/27/16 1649 09/27/16 2051 09/28/16 0735 09/28/16 1127  GLUCAP 193* 87 132* 184* 114*   Lipid Profile: No results for input(s): CHOL, HDL, LDLCALC, TRIG, CHOLHDL, LDLDIRECT in the last 72 hours. Thyroid Function Tests:  Recent Labs  09/26/16 1845 09/27/16 0653  TSH 0.107*  --   FREET4  --  0.72   Anemia Panel: No results for input(s): VITAMINB12, FOLATE, FERRITIN, TIBC, IRON, RETICCTPCT in the last 72 hours. Urine analysis:    Component Value Date/Time   COLORURINE  YELLOW 07/31/2013 2237   APPEARANCEUR CLEAR 07/31/2013 2237   LABSPEC 1.006 07/31/2013 2237   PHURINE 6.5 07/31/2013 2237   GLUCOSEU NEGATIVE 07/31/2013 2237   HGBUR NEGATIVE 07/31/2013 2237   BILIRUBINUR NEGATIVE 07/31/2013 2237   KETONESUR NEGATIVE 07/31/2013 2237   PROTEINUR NEGATIVE 07/31/2013 2237   UROBILINOGEN 0.2 07/31/2013 2237   NITRITE NEGATIVE 07/31/2013 2237   LEUKOCYTESUR NEGATIVE 07/31/2013 2237     Ripudeep Rai M.D. [facility] 09/28/2016, 2:01 PM  Pager: [phone] Between 7am to 7pm - call Pager - [phone]  After 7pm go to [email or web address] - password [email or web address]  Call night coverage person covering after 7pm

## 2016-09-28 NOTE — Progress Notes (Signed)
Name: Joan [name] MRN: [id number] DOB: Apr 26, 1960    ADMISSION DATE:  09/24/2016 CONSULTATION DATE:  09/27/2016  REFERRING MD :  Dr. Tana Coast  CHIEF COMPLAINT:  COPD exacerbation, establishment of pulmonary care.  HISTORY OF PRESENT ILLNESS:   56 yoF with PMH of COPD, ongoing tobacco abuse, OSA on CPAP, VCD, anxiety, depression, DM, Hep C, thyroid disease and "heart-lung blood flow issue" admitted on 7/31 to [facility] on SDU with dyspnea.  Patient was widowed 5 years ago and since reports she has been traveling the [employer] friends.  Recently staying with her brother in [place] since June.  Reports last seeing a Pulmonologist "years" ago along with PFTs and sleep study many years ago.  She is a current smoker since the age of 20, smoking up to 2PPD at her heaviest but recently trying to wean down, currently to half to 1PPD.  Prior exposure to smoking marijuana and crack cocaine 5 yrs ago.  She uses a wheelchair or motorized scooter- for several years now, to get around while shopping due to her chronic dyspnea.  Denies home O2 requirements. States is compliant with her home CPAP.  Reports her brother had a URI prior to her becoming sick.  She started to feel feverish with hot flashes with productive cough- with change to lime-green sputum from her baseline of thick yellow to white , dyspnea, and pleuritic chest pain with coughing about a week ago.  She started taking some oral prednisone from a prior prescription and using home nebulizer without much improvement until her neb machine broke the day before she presented to the hospital.  She was admitted 7/31 with acute COPD exacerbation and acute hypoxic respiratory failure with room air sats in mid 80's treated with nebs, steroids, intermittent BiPAP and empiric zithromax.  She has had ongoing anxiety, possibly related to steroids +/- albuterol and continues to have ongoing wheezing and BiPAP needs.  PCCM to evaluate.    SUBJECTIVE:  She does  not like BiPAP, overall feels and breathing better. RN reports anxiety precedes her SOB attacks, better after ativan  VITAL SIGNS: Temp:  [97.5 F (36.4 C)-98 F (36.7 C)] 97.5 F (36.4 C) (08/04 1130) Pulse Rate:  [66-96] 78 (08/04 1142) Resp:  [16-29] 20 (08/04 1142) BP: (134-164)/(74-99) 134/74 (08/04 1130) SpO2:  [92 %-100 %] 97 % (08/04 1142) FiO2 (%):  [32 %] 32 % (08/03 1424) Weight:  [93.8 kg (206 lb 14.4 oz)] 93.8 kg (206 lb 14.4 oz) (08/04 1740)  PHYSICAL EXAMINATION: General:  Older than stated age woman sitting in bed, anxious, otherwise no distress HEENT: MM pink/moist, PERRL, raspy voice, intermittent transmitted upper airway wheezes PSY: anxious, very talkative Neuro: AOx4, MAE, nonfocal CV: s1s2 rrr, no murmur PULM: even/non-labored, able to speak without difficulty, tachypnic at times, lungs bilaterally diminished throughout with faint prolonged wheeze, 100% on 3L [place] GI: obese, soft, non-tender, bs active  Extremities: warm/dry, no edema  Skin: scattered bruising to arms   Recent Labs Lab 09/26/16 0233 09/27/16 0251 09/28/16 0303  NA 140 140 139  K 4.4 4.6 4.5  CL 105 104 103  CO2 24 27 26   BUN 22* 29* 33*  CREATININE 0.97 0.91 1.02*  GLUCOSE 292* 217* 202*    Recent Labs Lab 09/26/16 0233 09/27/16 0251 09/28/16 0303  HGB 13.9 14.1 14.2  HCT 41.8 42.1 44.6  WBC 24.8* 23.5* 19.5*  PLT 279 284 316   Dg Chest Port 1 View  Result Date: 09/27/2016 CLINICAL DATA:  56 y/o  F; wheezing. EXAM: PORTABLE CHEST 1 VIEW COMPARISON:  09/24/2016 chest radiograph. FINDINGS: Stable cardiac silhouette within normal limits. Upper lobe emphysema is stable. No consolidation, effusion, or pneumothorax. No acute osseous abnormality is evident. IMPRESSION: Stable upper lobe emphysema.  No acute pulmonary process identified. Electronically Signed   By: Kristine Garbe M.D.   On: 09/27/2016 15:58     ASSESSMENT / PLAN:  COPD with exacerbation Acute  hypoxic/hypercapnic respiratory failure OSA on CPAP (she says BiPAP at home) Hx of VCD- per patient  Tobacco abuse  Anxiety  Agree with current BD regimen Agree with increased PPI, suspect that GERD playing a role in UA instability Wean steroids as soon as possible.  Anxiety clearly playing a role here, will need benzos, reassurance, education   We will check on her again on Monday 8/6.    Baltazar Apo, MD, PhD 09/28/2016, 2:25 PM [facility] [phone] or if no answer [phone]

## 2016-09-28 NOTE — Progress Notes (Signed)
Nutrition Brief Note  Consult received for diet education for pt/brother on heart healthy meal planning. However, when RD spoke to pt she wanted help getting a bedtime snack ordered. She declines any heart healthy diet education at this time.  Appropriate HS snack ordered based on preferences.   Wt Readings from Last 15 Encounters:  09/28/16 206 lb 14.4 oz (93.8 kg)  08/09/16 200 lb (90.7 kg)  07/31/13 226 lb 6 oz (102.7 kg)   Lab Results  Component Value Date   HGBA1C 5.9 (H) 09/24/2016    Body mass index is 31.46 kg/m. Patient meets criteria for obesity class I based on current BMI.   Current diet order is Heart Healthy, patient is consuming approximately 100% of meals at this time. Labs and medications reviewed.   No nutrition interventions warranted at this time. If nutrition issues arise, please consult RD.   [place], [place], [place] Pager [phone] After Hours Pager

## 2016-09-28 NOTE — Plan of Care (Signed)
Problem: Nutrition: Goal: Adequate nutrition will be maintained Outcome: Progressing Discussed with patient and brother about plan of care and eating a balanced meal with same number of calories, carbs, fats and proteins with some teach back displayed.  Patient and brother would like to speak to the nutritionist about healthy meal planning.

## 2016-09-29 LAB — GLUCOSE, CAPILLARY
Glucose-Capillary: 162 mg/dL — ABNORMAL HIGH (ref 65–99)
Glucose-Capillary: 86 mg/dL (ref 65–99)
Glucose-Capillary: 110 mg/dL — ABNORMAL HIGH (ref 65–99)
Glucose-Capillary: 102 mg/dL — ABNORMAL HIGH (ref 65–99)

## 2016-09-29 LAB — CULTURE, RESPIRATORY W GRAM STAIN: Culture: NORMAL

## 2016-09-29 LAB — PROCALCITONIN: Procalcitonin: <0.1 ng/mL

## 2016-09-29 MED ORDER — INSULIN GLARGINE 100 UNIT/ML ~~LOC~~ SOLN
10.0000 [IU] | Freq: Every day | SUBCUTANEOUS | Status: DC
  Administered 2016-09-29: 10 [IU] via SUBCUTANEOUS
  Filled 2016-09-29: qty 0.1

## 2016-09-29 MED ORDER — CLONAZEPAM 0.5 MG PO TABS: 0.5000 mg | ORAL_TABLET | Freq: Two times a day (BID) | ORAL | Status: DC

## 2016-09-29 MED ORDER — CLONAZEPAM 0.5 MG PO TABS
0.5000 mg | ORAL_TABLET | Freq: Two times a day (BID) | ORAL | Status: DC
  Administered 2016-09-29 – 2016-09-30 (×3): 0.5 mg via ORAL
  Filled 2016-09-29 (×3): qty 1

## 2016-09-29 NOTE — Progress Notes (Signed)
[facility]                                                                              Patient Demographics  Joan [name], is a 56 y.o. female, DOB - 03-08-1960, ZOX:[id number]  Admit date - 09/24/2016   Admitting Physician Elease Etienne, MD  Outpatient Primary MD for the patient is Gwenyth Bender, MD  Outpatient specialists:   LOS - 4  days   Medical records reviewed and are as summarized below:    Chief Complaint  Patient presents with  . Shortness of Breath  . COPD  . Chest Pain       Brief summary   The patient is a 56 year old female who recently moved to [facility] area, with history of COPD, ongoing tobacco use, OSA on nightly CPAP, diabetes presented with worsening dyspnea, wheezing. In ED was noted to be in respirator distress, tachycardia, hypotensive, tachypneic. The patient was given a dose of Solu-Medrol, bronchodilators, placed on BiPAP. She was admitted to stepdown unit.    Assessment & Plan    Principal Problem:   Acute on chronic respiratory failure with hypoxia (HCC) Likely due to COPD exacerbation - Patient was admitted with worsening dyspnea, wheezing, needed BiPAP. She was placed on scheduled bronchodilators, IV Solu-Medrol with no significant improvement hence pulmonology was consulted. - Recommended patient to quit smoking - Home O2 evaluation prior to discharge - Ordered outpatient DME nebulizer machine (broken at home), case management to assist - D-dimer negative. 2-D echo showed EF of 55-60% with grade 1 diastolic dysfunction - Improving, has significant anxiety causing ?VCD. DC Ativan, placed on scheduled Klonopin - Continue Brovana, Pulmicort, Xopenex nebs, Singulair - will make outpatient pulmonology appointment tomorrow -DC Levaquin, pro-calcitonin less than 0.1   Active Problems:    Tobacco abuse - Counseled strongly on tobacco cessation - Continue nicotine patch    Anxiety -Continue Ativan as needed for  anxiety  OSA on CPAP - Continue CPAP daily at bedtime    DM II (diabetes mellitus, type II), controlled (HCC) - Hemoglobin A1c 5.9. Hyperglycemia likely due to steroids.  -  CBGs better controlled, off the steroids, DC meal coverage, decrease Lantus to 10 units, continue sliding scale insulin   Hypothyroidism - TSH suppressed 0.1, DC Synthroid. 3 T4 0.7 to within normal limits - Repeat TSH, free T4 in 4 weeks  Sinus tachycardia: multifactorial,  patient noted to be on atenolol at home which was held on admission and can cause reflex tachycardia due to BB withdrawal, anxiety, scheduled albuterol treatments, brovana    - Now significantly improved, 2-D echo assuring. EF 55-60%.  - TSH suppressed 0.1 and hence her Synthroid discontinued - D-dimer negative, continue Xopenex - Placed on Klonopin for anxiety  ? Carotid disease - Patient reports that she just moved from [place] and has not established primary care yet. She states that she has a history of carotid disease and wants the carotid ultrasound checked while she is inpatient. She is supposed to see Dr. Willey Blade on 8/13 but does not want to wait for her PCP appt.  - Carotid ultrasound showed 1-39% ICA stenosis  Right groin and leg pain  - Again patient reports that she had a cardiac cath in [place], no stents were placed. Since then she has been having right groin pain radiating to her right leg and wants Doppler of her right groin and leg. I explained to the patient that this can be followed closely by her PCP and she may have sciatica or neuropathic pain. Patient wants the Doppler to be done inpatient while she is here.  - Doppler ultrasound negative for any DVT   Code Status: full code  DVT Prophylaxis:  Lovenox  Family Communication: Discussed in detail with the patient, all imaging results, lab results explained to the patient   Disposition Plan: Possible DCtomorrow transfer to the floor  Time Spent in minutes  25  minutes  Procedures:  Bipap  Consultants:   Pulmonology  Antimicrobials:      Medications  Scheduled Meds: . arformoterol  15 mcg Nebulization BID  . atorvastatin  20 mg Oral Daily  . baclofen  10 mg Oral QHS  . budesonide (PULMICORT) nebulizer solution  0.5 mg Nebulization BID  . Chlorhexidine Gluconate Cloth  6 each Topical Q0600  . clonazePAM  0.5 mg Oral BID  . enoxaparin (LOVENOX) injection  40 mg Subcutaneous Q24H  . insulin aspart  0-15 Units Subcutaneous TID WC  . insulin aspart  0-5 Units Subcutaneous QHS  . insulin aspart  4 Units Subcutaneous TID WC  . insulin glargine  12 Units Subcutaneous QHS  . levalbuterol  0.63 mg Nebulization Q6H   And  . ipratropium  0.5 mg Nebulization Q6H  . metoprolol tartrate  25 mg Oral BID  . montelukast  10 mg Oral QHS  . mupirocin ointment  1 application Nasal BID  . nicotine  21 mg Transdermal Daily  . pantoprazole (PROTONIX) IV  40 mg Intravenous Q12H  . pregabalin  75 mg Oral BID  . roflumilast  500 mcg Oral Daily  . sodium chloride flush  3 mL Intravenous Q12H  . valACYclovir  500 mg Oral Daily  . venlafaxine XR  150 mg Oral Q breakfast   Continuous Infusions: . sodium chloride 10 mL/hr at 09/26/16 0800  . levofloxacin (LEVAQUIN) IV Stopped (09/29/16 0938)   PRN Meds:.acetaminophen **OR** acetaminophen, diphenhydrAMINE, hydrALAZINE, levalbuterol, ondansetron **OR** ondansetron (ZOFRAN) IV   Antibiotics   Anti-infectives    Start     Dose/Rate Route Frequency Ordered Stop   09/27/16 0900  levofloxacin (LEVAQUIN) IVPB 750 mg     750 mg 100 mL/hr over 90 Minutes Intravenous Every 24 hours 09/27/16 0844     09/25/16 2000  azithromycin (ZITHROMAX) tablet 250 mg  Status:  Discontinued     250 mg Oral Every 24 hours 09/24/16 1831 09/27/16 0844   09/24/16 2115  valACYclovir (VALTREX) tablet 500 mg     500 mg Oral Daily 09/24/16 2101     09/24/16 2000  azithromycin (ZITHROMAX) tablet 500 mg     500 mg Oral Daily  09/24/16 1831 09/24/16 2059        Subjective:   Joan [name] was seen and examined today. Anxiety but overall improving, shortness of breath and wheezing is improving. No fevers or chills. Heart rate improved.  Patient denies dizziness, chest pain, abdominal pain, N/V/D/C, new weakness, numbess, tingling. No fevers or chills.  Objective:   Vitals:   09/29/16 0604 09/29/16 0718 09/29/16 1047 09/29/16 1135  BP:  129/68  132/84  Pulse:  73  76  Resp:  19  (!)  28  Temp:  97.8 F (36.6 C)  98.2 F (36.8 C)  TempSrc:  Oral  Oral  SpO2:  96% 93% 96%  Weight: 92 kg (202 lb 12.8 oz)     Height:        Intake/Output Summary (Last 24 hours) at 09/29/16 1205 Last data filed at 09/29/16 0525  Gross per 24 hour  Intake           814.17 ml  Output             1400 ml  Net          -585.83 ml     Wt Readings from Last 3 Encounters:  09/29/16 92 kg (202 lb 12.8 oz)  08/09/16 90.7 kg (200 lb)  07/31/13 102.7 kg (226 lb 6 oz)     Exam   General: Alert and oriented x 3, NAD  Eyes:   HEENT:    Cardiovascular: S1 S2 auscultated, no rubs, murmurs or gallops. Regular rate and rhythm. No pedal edema b/l  Respiratory: Mild scattered wheezing  Gastrointestinal: Soft, nontender, nondistended, + bowel sounds  Ext: no pedal edema bilaterally  Neuro: no new deficits  Musculoskeletal: No digital cyanosis, clubbing  Skin: No rashes  Psych: Normal affect and demeanor, alert and oriented x3    Data Reviewed:  I have personally reviewed following labs and imaging studies  Micro Results Recent Results (from the past 240 hour(s))  MRSA PCR Screening     Status: Abnormal   Collection Time: 09/25/16  6:58 AM  Result Value Ref Range Status   MRSA by PCR POSITIVE (A) NEGATIVE Final    Comment:        The GeneXpert MRSA Assay (FDA approved for NASAL specimens only), is one component of a comprehensive MRSA colonization surveillance program. It is not intended to diagnose  MRSA infection nor to guide or monitor treatment for MRSA infections. RESULT CALLED TO, READ BACK BY AND VERIFIED WITH: A. Earlene Plater RN 9:35 09/25/16 ([email or web address])   Culture, sputum-assessment     Status: None   Collection Time: 09/26/16  5:26 PM  Result Value Ref Range Status   Specimen Description EXPECTORATED SPUTUM  Final   Special Requests NONE  Final   Sputum evaluation THIS SPECIMEN IS ACCEPTABLE FOR SPUTUM CULTURE  Final   Report Status 09/27/2016 FINAL  Final  Culture, respiratory (NON-Expectorated)     Status: None (Preliminary result)   Collection Time: 09/26/16  5:26 PM  Result Value Ref Range Status   Specimen Description EXPECTORATED SPUTUM  Final   Special Requests NONE Reflexed from [id number]  Final   Gram Stain   Final    ABUNDANT WBC PRESENT,BOTH PMN AND MONONUCLEAR ABUNDANT GRAM POSITIVE COCCI IN PAIRS RARE GRAM POSITIVE RODS RARE GRAM NEGATIVE RODS    Culture CULTURE REINCUBATED FOR BETTER GROWTH  Final   Report Status PENDING  Incomplete    Radiology Reports Dg Chest Port 1 View  Result Date: 09/27/2016 CLINICAL DATA:  56 y/o  F; wheezing. EXAM: PORTABLE CHEST 1 VIEW COMPARISON:  09/24/2016 chest radiograph. FINDINGS: Stable cardiac silhouette within normal limits. Upper lobe emphysema is stable. No consolidation, effusion, or pneumothorax. No acute osseous abnormality is evident. IMPRESSION: Stable upper lobe emphysema.  No acute pulmonary process identified. Electronically Signed   By: Mitzi Hansen M.D.   On: 09/27/2016 15:58   Dg Chest Portable 1 View  Result Date: 09/24/2016 CLINICAL DATA:  Shortness of breath.  History of COPD EXAM: PORTABLE CHEST  1 VIEW COMPARISON:  August 09, 2016 FINDINGS: There is evidence of upper lobe bullous disease. There is no edema or consolidation. Relative prominence of the interstitium in the lung bases is probably due to redistribution of blood flow to viable lung segments. Heart size is within normal limits. Pulmonary  vascularity reflects upper lobe bullous disease. No adenopathy. No bone lesions. IMPRESSION: Bilateral upper lobe bullous disease, stable. No edema or consolidation. Stable cardiac silhouette. Electronically Signed   By: Bretta Bang III M.D.   On: 09/24/2016 16:30    Lab Data:  CBC:  Recent Labs Lab 09/24/16 1108 09/26/16 0233 09/27/16 0251 09/28/16 0303  WBC 12.5* 24.8* 23.5* 19.5*  HGB 14.4 13.9 14.1 14.2  HCT 42.3 41.8 42.1 44.6  MCV 97.0 97.2 98.6 99.1  PLT 280 279 284 316   Basic Metabolic Panel:  Recent Labs Lab 09/24/16 1108 09/26/16 0233 09/27/16 0251 09/28/16 0303  NA 142 140 140 139  K 4.3 4.4 4.6 4.5  CL 107 105 104 103  CO2 28 24 27 26   GLUCOSE 105* 292* 217* 202*  BUN 14 22* 29* 33*  CREATININE 0.88 0.97 0.91 1.02*  CALCIUM 9.2 9.0 9.2 9.0   GFR: Estimated Creatinine Clearance: 74.8 mL/min (A) (by C-G formula based on SCr of 1.02 mg/dL (H)). Liver Function Tests: No results for input(s): AST, ALT, ALKPHOS, BILITOT, PROT, ALBUMIN in the last 168 hours. No results for input(s): LIPASE, AMYLASE in the last 168 hours. No results for input(s): AMMONIA in the last 168 hours. Coagulation Profile: No results for input(s): INR, PROTIME in the last 168 hours. Cardiac Enzymes: No results for input(s): CKTOTAL, CKMB, CKMBINDEX, TROPONINI in the last 168 hours. BNP (last 3 results) No results for input(s): PROBNP in the last 8760 hours. HbA1C: No results for input(s): HGBA1C in the last 72 hours. CBG:  Recent Labs Lab 09/28/16 1127 09/28/16 1546 09/28/16 2152 09/29/16 0745 09/29/16 1203  GLUCAP 114* 110* 172* 162* 86   Lipid Profile: No results for input(s): CHOL, HDL, LDLCALC, TRIG, CHOLHDL, LDLDIRECT in the last 72 hours. Thyroid Function Tests:  Recent Labs  09/26/16 1845 09/27/16 0653  TSH 0.107*  --   FREET4  --  0.72   Anemia Panel: No results for input(s): VITAMINB12, FOLATE, FERRITIN, TIBC, IRON, RETICCTPCT in the last 72  hours. Urine analysis:    Component Value Date/Time   COLORURINE YELLOW 07/31/2013 2237   APPEARANCEUR CLEAR 07/31/2013 2237   LABSPEC 1.006 07/31/2013 2237   PHURINE 6.5 07/31/2013 2237   GLUCOSEU NEGATIVE 07/31/2013 2237   HGBUR NEGATIVE 07/31/2013 2237   BILIRUBINUR NEGATIVE 07/31/2013 2237   KETONESUR NEGATIVE 07/31/2013 2237   PROTEINUR NEGATIVE 07/31/2013 2237   UROBILINOGEN 0.2 07/31/2013 2237   NITRITE NEGATIVE 07/31/2013 2237   LEUKOCYTESUR NEGATIVE 07/31/2013 2237     Lakyia Behe M.D. [facility] 09/29/2016, 12:05 PM  Pager: [phone] Between 7am to 7pm - call Pager - [phone]  After 7pm go to [email or web address] - password [email or web address]  Call night coverage person covering after 7pm

## 2016-09-29 NOTE — Progress Notes (Signed)
Report given to [place]. Patient has all belongings and is no distress at time of transfer.

## 2016-09-30 ENCOUNTER — Telehealth: Payer: Self-pay | Admitting: Internal Medicine

## 2016-09-30 DIAGNOSIS — R062 Wheezing: Secondary | ICD-10-CM

## 2016-09-30 LAB — GLUCOSE, CAPILLARY
Glucose-Capillary: 94 mg/dL (ref 65–99)
Glucose-Capillary: 114 mg/dL — ABNORMAL HIGH (ref 65–99)

## 2016-09-30 MED ORDER — ARFORMOTEROL TARTRATE 15 MCG/2ML IN NEBU: 15.0000 ug | INHALATION_SOLUTION | Freq: Two times a day (BID) | RESPIRATORY_TRACT | 3 refills | Status: DC

## 2016-09-30 MED ORDER — LEVALBUTEROL HCL 0.63 MG/3ML IN NEBU: 0.6300 mg | INHALATION_SOLUTION | Freq: Four times a day (QID) | RESPIRATORY_TRACT | 12 refills | Status: DC

## 2016-09-30 MED ORDER — PANTOPRAZOLE SODIUM 40 MG PO TBEC: 40.0000 mg | DELAYED_RELEASE_TABLET | Freq: Two times a day (BID) | ORAL | Status: DC

## 2016-09-30 MED ORDER — ROFLUMILAST 500 MCG PO TABS: 500.0000 ug | ORAL_TABLET | Freq: Every day | ORAL | 1 refills | Status: DC

## 2016-09-30 MED ORDER — BUDESONIDE-FORMOTEROL FUMARATE 160-4.5 MCG/ACT IN AERO: 2.0000 | INHALATION_SPRAY | Freq: Two times a day (BID) | RESPIRATORY_TRACT | 12 refills | Status: DC

## 2016-09-30 MED ORDER — CLONAZEPAM 0.5 MG PO TABS: 0.5000 mg | ORAL_TABLET | Freq: Two times a day (BID) | ORAL | 0 refills | Status: DC

## 2016-09-30 MED ORDER — PANTOPRAZOLE SODIUM 40 MG PO TBEC: 40.0000 mg | DELAYED_RELEASE_TABLET | Freq: Two times a day (BID) | ORAL | 3 refills | Status: DC

## 2016-09-30 MED ORDER — BUDESONIDE 0.5 MG/2ML IN SUSP: 0.5000 mg | Freq: Two times a day (BID) | RESPIRATORY_TRACT | 5 refills | Status: DC

## 2016-09-30 MED ORDER — PREGABALIN 75 MG PO CAPS: 75.0000 mg | ORAL_CAPSULE | Freq: Two times a day (BID) | ORAL | 0 refills | Status: DC

## 2016-09-30 MED ORDER — METOPROLOL TARTRATE 25 MG PO TABS: 25.0000 mg | ORAL_TABLET | Freq: Two times a day (BID) | ORAL | 3 refills | Status: DC

## 2016-09-30 MED ORDER — NICOTINE 21 MG/24HR TD PT24: 21.0000 mg | MEDICATED_PATCH | Freq: Every day | TRANSDERMAL | 0 refills | Status: DC

## 2016-09-30 MED ORDER — TIOTROPIUM BROMIDE MONOHYDRATE 18 MCG IN CAPS: 18.0000 ug | ORAL_CAPSULE | Freq: Every day | RESPIRATORY_TRACT | 12 refills | Status: DC

## 2016-09-30 NOTE — Progress Notes (Signed)
   09/30/16 1100  Clinical Encounter Type  Visited With Patient;Health care provider  Visit Type Follow-up;Spiritual support  Referral From Nurse  Consult/Referral To Chaplain  Spiritual Encounters  Spiritual Needs Literature;Prayer;Emotional  Stress Factors  Patient Stress Factors None identified  Advance Directives (For Healthcare)  Does Patient Have a Medical Advance Directive? Yes    Chaplain followed up on [place] consult for ADHCPOA. Provided direction and education for filling out paperwork. Called notary and forms were signed and witness. Patient will provide nurse with original to copy and place in file. Provided prayer, ministry of presence and emotional support. Sayaka Hoeppner L. Volanda Napoleon, MDiv

## 2016-09-30 NOTE — Progress Notes (Signed)
Patient walk from her room to Hemodialysis and back. Saturation remained in the low 90's without oxygen. Patient walked in the hallways with the use of walker.

## 2016-09-30 NOTE — Care Management Note (Addendum)
Case Management Note  Patient Details  Name: Joan [name] MRN: [id number] Date of Birth: 13-Mar-1960  Subjective/Objective:  CM following for progression and d/c planning.                   Action/Plan: 09/30/2016 Met with pt re HH needs, [facility] to provide nebulizer for home use. Pt ambulating in the hall with PT with oxygen remaining in the low 90's. Await orders for HHPT and HHRN.  1pm, HH to be provided by [facility]. Nebulizer delivered to pt room.  Pt ambulating independently without oxygen.   Expected Discharge Date:  09/30/16               Expected Discharge Plan:  [place]  In-House Referral:  NA  Discharge planning Services  CM Consult  Post Acute Care Choice:  Durable Medical Equipment, Home Health Choice offered to:  Patient  DME Arranged:  Nebulizer machine DME Agency:  [place] Arranged:  RN, PT OT, Aide, respiratory therapy [place] Agency:  [facility]  Status of Service:  Completed, signed off  If discussed at [employer], dates discussed:    Additional Comments:  Adron Bene, RN 09/30/2016, 11:16 AM

## 2016-09-30 NOTE — Progress Notes (Signed)
Name: Joan [name] MRN: [id number] DOB: 09-27-1960    ADMISSION DATE:  09/24/2016 CONSULTATION DATE:  09/27/2016  REFERRING MD :  Dr. Tana Coast  CHIEF COMPLAINT:  COPD exacerbation, establishment of pulmonary care.  HISTORY OF PRESENT ILLNESS:   39 yoF with PMH of COPD, ongoing tobacco abuse, OSA on CPAP, VCD, anxiety, depression, DM, Hep C, thyroid disease and "heart-lung blood flow issue" admitted on 7/31 to [facility] on SDU with dyspnea.  Patient was widowed 5 years ago and since reports she has been traveling the [employer] friends.  Recently staying with her brother in [place] since June.  Reports last seeing a Pulmonologist "years" ago along with PFTs and sleep study many years ago.  She is a current smoker since the age of 33, smoking up to 2PPD at her heaviest but recently trying to wean down, currently to half to 1PPD.  Prior exposure to smoking marijuana and crack cocaine 5 yrs ago.  She uses a wheelchair or motorized scooter- for several years now, to get around while shopping due to her chronic dyspnea.  Denies home O2 requirements. States is compliant with her home CPAP.  Reports her brother had a URI prior to her becoming sick.  She started to feel feverish with hot flashes with productive cough- with change to lime-green sputum from her baseline of thick yellow to white , dyspnea, and pleuritic chest pain with coughing about a week ago.  She started taking some oral prednisone from a prior prescription and using home nebulizer without much improvement until her neb machine broke the day before she presented to the hospital.  She was admitted 7/31 with acute COPD exacerbation and acute hypoxic respiratory failure with room air sats in mid 80's treated with nebs, steroids, intermittent BiPAP and empiric zithromax.  She has had ongoing anxiety, possibly related to steroids +/- albuterol and continues to have ongoing wheezing and BiPAP needs.  PCCM to evaluate.    SUBJECTIVE:  Pt reports  feeling better > still gets winded with activity.  PT worked with patient > no O2 need at discharge.  Educated on pursed lipped breathing.   VITAL SIGNS: Temp:  [97.8 F (36.6 C)-98.5 F (36.9 C)] 98.2 F (36.8 C) (08/06 0930) Pulse Rate:  [71-90] 85 (08/06 0930) Resp:  [20-28] 20 (08/06 0447) BP: (132-144)/(84-92) 136/86 (08/06 0930) SpO2:  [91 %-100 %] 98 % (08/06 0930) FiO2 (%):  [21 %] 21 % (08/06 0904)  PHYSICAL EXAMINATION: General: chronically ill appearing female in NAD HEENT: MM pink/dry, no jvd, poor dentition  Neuro: AAOx4, speech clear, MAE CV: s1s2 rrr, no m/r/g PULM: even/non-labored, lungs bilaterally with faint exp wheezing, speaks full paragraphs without distress  NF:AOZH, non-tender, bsx4 active  Extremities: warm/dry, no edema  Skin: no rashes or lesions. Multiple tattoos.    Recent Labs Lab 09/26/16 0233 09/27/16 0251 09/28/16 0303  NA 140 140 139  K 4.4 4.6 4.5  CL 105 104 103  CO2 24 27 26   BUN 22* 29* 33*  CREATININE 0.97 0.91 1.02*  GLUCOSE 292* 217* 202*    Recent Labs Lab 09/26/16 0233 09/27/16 0251 09/28/16 0303  HGB 13.9 14.1 14.2  HCT 41.8 42.1 44.6  WBC 24.8* 23.5* 19.5*  PLT 279 284 316   No results found.   ASSESSMENT / PLAN:  COPD with exacerbation Acute hypoxic/hypercapnic respiratory failure OSA on CPAP (she says BiPAP at home) Hx of VCD - per patient  Tobacco abuse  Anxiety  PLAN: Continue brovana +  Pulmicort while inpatient  OK to discharge on Symbicort (on her home med list in place of above) & Spiriva Continue PPI at discharge given concern for possible GERD effecting upper airway instability Continue Singulair, Daliresp at discharge Anxiety playing large role in respiratory symptoms > will likely need benzos, ongoing reassurance & education Will arrange for follow up with pulmonary > see discharge section  Continue home CPAP   PCCM will sign off.  Please call back if new needs arise.   Noe Gens,  NP-C [facility] Pgr: [phone] or if no answer [phone] 09/30/2016, 10:53 AM

## 2016-09-30 NOTE — Telephone Encounter (Signed)
Triage  This patient Joan [name], Joan [name] is dc from hospital. Pls give first avail for fu aecopd and vcd with an app. Your phone systems are down 09/30/2016  - so I am sending via epic  Dr. Brand Males, M.D., [facility].C.P Pulmonary and Critical Care Medicine Staff Physician [facility] Pager: [phone], If no answer or between  15:00h - 7:00h: call [phone]  09/30/2016 11:35 AM

## 2016-09-30 NOTE — Progress Notes (Signed)
Physical Therapy Treatment Patient Details Name: Joan [name] MRN: [id number] DOB: 11-22-1960 Today's Date: 09/30/2016    History of Present Illness Patient is a 56 y/o female who presents with dyspnea. CXR- upper love bullous disease. Admitted with COPD exacerbation and acute respiratory failure. PMH includes COPD, DM, Hep C, OSA on CPAP, depression, bipolar disordera, anxiety.     PT Comments    Pt presented in bed asleep. Pt awoke to greeting and was ready to participate in therapy. Pt performed bed mobility independently on room air. Pt was Minguard assist for ambulation needing increased time and a rolling walker for fatigue and alleviation of back pain. Pt ambulated 200 ft with rolling walker, and 100 ft without rolling walker and O2 saturation did not fall below 90% on room air. Pt was educated about pursed lip breathing and paced breathing to help with feelings of breathlessness and demonstrated knowledge of techniques at end of session. Pt reported slight lightheadedness at beginning of ambulation that resolved promptly. Pt c/o breathlessness while re-entering bed but O2 saturation did no fall below 92 % while preforming task on room air. Pt will benefit from continued skilled Acute PT services to increase functional mobility and safety. Pt will benefit from home health PT upon discharge until enrollment into pulmonary discharge can be facilitated.  Follow Up Recommendations  Home health PT;Outpatient PT (Home health PT while Pulmonary Rehab elegibility determined.)     Equipment Recommendations  None recommended by PT    Recommendations for Other Services       Precautions / Restrictions Restrictions Weight Bearing Restrictions: No    Mobility  Bed Mobility Overal bed mobility: Independent Bed Mobility: Supine to Sit;Sit to Supine     Supine to sit: Independent;HOB elevated Sit to supine: Independent;HOB elevated   General bed mobility comments: Pt was moving well within  normal time expectations for tasks  Transfers Overall transfer level: Needs assistance Equipment used: Rolling walker (2 wheeled) Transfers: Sit to/from Stand Sit to Stand: Supervision         General transfer comment: Cues for hand placement when standing with walker. Pt performed one sit to stand without walker at end of session.   Ambulation/Gait Ambulation/Gait assistance: Min guard Ambulation Distance (Feet): 300 Feet Assistive device: Rolling walker (2 wheeled) Gait Pattern/deviations: Decreased step length - right;Decreased step length - left Gait velocity: decreased   General Gait Details: Very slow, mildly unsteady gait. Maintained SpO2 above 90% while ambulating on room air. Pt ambulated 200 ft with rolling walker and 100 ft without rolling walker.   Stairs            Wheelchair Mobility    Modified Rankin (Stroke Patients Only)       Balance Overall balance assessment: Needs assistance Sitting-balance support: Feet supported;No upper extremity supported Sitting balance-Leahy Scale: Good     Standing balance support: During functional activity;Bilateral upper extremity supported Standing balance-Leahy Scale: Fair                              Cognition Arousal/Alertness: Awake/alert Behavior During Therapy: WFL for tasks assessed/performed Overall Cognitive Status: Within Functional Limits for tasks assessed                                        Exercises      General Comments  Pertinent Vitals/Pain Pain Assessment: 0-10 Pain Score: 6  Pain Location: Back pain from previous surgery Pain Descriptors / Indicators: Aching Pain Intervention(s): Monitored during session;Relaxation    Home Living                      Prior Function            PT Goals (current goals can now be found in the care plan section) Acute Rehab PT Goals Patient Stated Goal: to be able to breathe and walk PT Goal  Formulation: With patient Time For Goal Achievement: 10/11/16 Potential to Achieve Goals: Good Progress towards PT goals: Progressing toward goals    Frequency    Min 3X/week      PT Plan Current plan remains appropriate    Co-evaluation              AM-PAC PT "6 Clicks" Daily Activity  Outcome Measure  Difficulty turning over in bed (including adjusting bedclothes, sheets and blankets)?: None Difficulty moving from lying on back to sitting on the side of the bed? : None Difficulty sitting down on and standing up from a chair with arms (e.g., wheelchair, bedside commode, etc,.)?: None Help needed moving to and from a bed to chair (including a wheelchair)?: None Help needed walking in hospital room?: A Little Help needed climbing 3-5 steps with a railing? : A Little 6 Click Score: 22    End of Session Equipment Utilized During Treatment: Gait belt Activity Tolerance: Patient tolerated treatment well (Pt slowed due to fatigue and back pain but tolerated treat) Patient left: in chair;with call bell/phone within reach;Other (comment) (Physician Assistant in room)   PT Visit Diagnosis: Unsteadiness on feet (R26.81);Muscle weakness (generalized) (M62.81);Difficulty in walking, not elsewhere classified (R26.2);Pain Pain - Right/Left:  (Lower) Pain - part of body:  (Back)     Time: 3474-2595 PT Time Calculation (min) (ACUTE ONLY): 39 min  Charges:  $Gait Training: 23-37 mins $Therapeutic Activity: 8-22 mins                    G Codes:       Drue Stager SPT Acute Rehabilitation Services Office: [phone]   Drue Stager 09/30/2016, 2:02 PM   I was present during the PT session and agree with patient status and findings as outlined by Nicole Kindred, SPT.  Roney Marion, [place]  Acute Rehabilitation Services Pager [phone] Office [phone]

## 2016-09-30 NOTE — Progress Notes (Signed)
   09/30/16 0100  BiPAP/CPAP/SIPAP  BiPAP/CPAP/SIPAP Pt Type Adult  Mask Type Nasal mask  Mask Size Small  Flow Rate 2 lpm  BiPAP/CPAP/SIPAP CPAP  Patient Home Equipment No  Auto Titrate Yes

## 2016-09-30 NOTE — Discharge Summary (Signed)
Physician Discharge Summary   Patient ID: Joan [name] MRN: [id number] DOB/AGE: 55-Jun-1962 55 y.o.  Admit date: 09/24/2016 Discharge date: 09/30/2016  Primary Care Physician:  Rogers Blocker, MD  Discharge Diagnoses:    . COPD exacerbation ([place]) . Acute on chronic respiratory failure with hypoxia ([place]) . Tobacco abuse . Anxiety   Obstructive sleep apnea on CPAP Diabetes mellitus type 2    Hypothyroidism  Sinus tachycardia   Consults:  Pulmonology   Recommendations for Outpatient Follow-up:  1. Home health PT OT, home health RN set up by case management 2. Please repeat CBC/BMET at next visit 3. Nebulizer machine given to the patient   DIET:Carb modified diet    Allergies:   Allergies  Allergen Reactions  . Dilaudid [Hydromorphone Hcl] Other (See Comments)    "heavy sedation"  . Iodine Other (See Comments)    Stomach cramps  . Keflex [Cephalexin] Other (See Comments)    unspecified  . Prednisone Other (See Comments)    "counter reacts"  . Shellfish Allergy Other (See Comments)    Stomach cramps  . Tetracyclines & Related Other (See Comments)    unspecified     DISCHARGE MEDICATIONS: Current Discharge Medication List    START taking these medications   Details  clonazePAM (KLONOPIN) 0.5 MG tablet Take 1 tablet (0.5 mg total) by mouth 2 (two) times daily. Qty: 30 tablet, Refills: 0    levalbuterol (XOPENEX) 0.63 MG/3ML nebulizer solution Take 3 mLs (0.63 mg total) by nebulization 4 (four) times daily. Qty: 60 mL, Refills: 12    metoprolol tartrate (LOPRESSOR) 25 MG tablet Take 1 tablet (25 mg total) by mouth 2 (two) times daily. Qty: 60 tablet, Refills: 3    nicotine (NICODERM CQ - DOSED IN MG/24 HOURS) 21 mg/24hr patch Place 1 patch (21 mg total) onto the skin daily. Qty: 28 patch, Refills: 0    pantoprazole (PROTONIX) 40 MG tablet Take 1 tablet (40 mg total) by mouth 2 (two) times daily before a meal. Qty: 60 tablet, Refills: 3      CONTINUE these  medications which have CHANGED   Details  budesonide-formoterol (SYMBICORT) 160-4.5 MCG/ACT inhaler Inhale 2 puffs into the lungs 2 (two) times daily. Qty: 1 Inhaler, Refills: 12    pregabalin (LYRICA) 75 MG capsule Take 1 capsule (75 mg total) by mouth 2 (two) times daily. Qty: 60 capsule, Refills: 0    roflumilast (DALIRESP) 500 MCG TABS tablet Take 1 tablet (500 mcg total) by mouth daily. Qty: 30 tablet, Refills: 1    tiotropium (SPIRIVA) 18 MCG inhalation capsule Place 1 capsule (18 mcg total) into inhaler and inhale daily. Qty: 30 capsule, Refills: 12      CONTINUE these medications which have NOT CHANGED   Details  atorvastatin (LIPITOR) 20 MG tablet Take 20 mg by mouth daily.    baclofen (LIORESAL) 10 MG tablet Take 10 mg by mouth at bedtime.    montelukast (SINGULAIR) 10 MG tablet Take 10 mg by mouth at bedtime.    valACYclovir (VALTREX) 500 MG tablet Take 500 mg by mouth daily.     venlafaxine XR (EFFEXOR-XR) 150 MG 24 hr capsule Take 150 mg by mouth daily with breakfast.      STOP taking these medications     albuterol (PROVENTIL) (2.5 MG/3ML) 0.083% nebulizer solution      atenolol (TENORMIN) 25 MG tablet      budesonide (PULMICORT) 1 MG/2ML nebulizer solution      formoterol (PERFOROMIST) 20 MCG/2ML nebulizer  solution      levothyroxine (SYNTHROID, LEVOTHROID) 25 MCG tablet      lisinopril (PRINIVIL,ZESTRIL) 10 MG tablet      naproxen (NAPROSYN) 500 MG tablet          Brief H and P: For complete details please refer to admission H and P, but in brief The patient is a 56 year old female who recently moved to [place] area, with history of COPD, ongoing tobacco use, OSA on nightly CPAP, diabetes presented with worsening dyspnea, wheezing. In ED was noted to be in respirator distress, tachycardia, hypotensive, tachypneic. The patient was given a dose of Solu-Medrol, bronchodilators, placed on BiPAP. She was admitted to stepdown unit.  Hospital Course:   Acute on chronic respiratory failure with hypoxia (HCC) Likely due to COPD exacerbation - Patient was admitted with worsening dyspnea, wheezing, needed BiPAP. She was placed on scheduled bronchodilators, IV Solu-Medrol with no significant improvement hence pulmonology was consulted. - Recommended patient to quit smoking - Home O2 evaluation was done prior to discharge. Patient complained of shortness of breath while reentering the bed however O2 sats did not fall below 92%. Patient was set up with home health PT however will recommend pulmonary rehabilitation. Outpatient pulmonary follow-up will be scheduled. - Ordered outpatient DME nebulizer machine (broken at home), case management will be arranged - D-dimer negative. 2-D echo showed EF of 55-60% with grade 1 diastolic dysfunction -Patient has significant anxiety causing the vocal cord dysfunction. She was given Ativan during hospitalization. Placed on scheduled Klonopin. Strongly recommended to follow-up with PCP for further refills. -  pulmonology recommended at the time of discharge to DC Brovana and Pulmicort  and continue Symbicort outpatient.  Levaquin was discontinued, Propulsid and less than 0.1 Dr. Chase Caller will arrange the pulmonology follow-up appointment     Tobacco abuse - Counseled strongly on tobacco cessation - Continue nicotine patch    Anxiety -Continue Ativan as needed for anxiety  OSA on CPAP - Continue CPAP daily at bedtime    DM II (diabetes mellitus, type II), controlled (HCC) - Hemoglobin A1c 5.9. Hyperglycemia likely due to steroids.  -  CBGs better controlled, off the steroids now. Patient states that she cannot tolerate prednisone.  - Discontinued insulin at the time of discharge. She states she will keep the log of the blood sugar readings and follow up with her PCP.   Hypothyroidism - TSH suppressed 0.1, DC Synthroid. T4 0.7 to within normal limits - Repeat TSH, free T4 in 4 weeks  Sinus  tachycardia: multifactorial,  patient noted to be on atenolol at home which was held on admission and can cause reflex tachycardia due to BB withdrawal, anxiety, scheduled albuterol treatments, brovana    - Now significantly improved, 2-D echo assuring. EF 55-60%.  - TSH suppressed 0.1 and hence her Synthroid discontinued - D-dimer negative, continue Xopenex - Placed on Klonopin for anxiety  ? Carotid disease - Patient reports that she just moved from [place] and has not established primary care yet. She states that she has a history of carotid disease and wants the carotid ultrasound checked while she is inpatient. She is supposed to see Dr. Kevan Ny on 8/13 but does not want to wait for her PCP appt.  - Carotid ultrasound showed 1-39% ICA stenosis  Right groin and leg pain  - Again patient reports that she had a cardiac cath in [place], no stents were placed. Since then she has been having right groin pain radiating to her right leg and  wants Doppler of her right groin and leg. I explained to the patient that this can be followed closely by her PCP and she may have sciatica or neuropathic pain. Patient wants the Doppler to be done inpatient while she is here.  - Doppler ultrasound negative for any DVT    Day of Discharge BP 136/86 (BP Location: Left Arm)   Pulse 85   Temp 98.2 F (36.8 C) (Oral)   Resp 20   Ht 5\' 8"  (1.727 m)   Wt 92 kg (202 lb 12.8 oz)   SpO2 98%   BMI 30.84 kg/m   Physical Exam: General: Alert and awake oriented x3 not in any acute distress. HEENT: anicteric sclera, pupils reactive to light and accommodation CVS: S1-S2 clear no murmur rubs or gallops Chest: clear to auscultation bilaterally, no wheezing rales or rhonchi Abdomen: soft nontender, nondistended, normal bowel sounds Extremities: no cyanosis, clubbing or edema noted bilaterally Neuro: Cranial nerves II-XII intact, no focal neurological deficits   The results of significant diagnostics from  this hospitalization (including imaging, microbiology, ancillary and laboratory) are listed below for reference.    LAB RESULTS: Basic Metabolic Panel:  Recent Labs Lab 09/27/16 0251 09/28/16 0303  NA 140 139  K 4.6 4.5  CL 104 103  CO2 27 26  GLUCOSE 217* 202*  BUN 29* 33*  CREATININE 0.91 1.02*  CALCIUM 9.2 9.0   Liver Function Tests: No results for input(s): AST, ALT, ALKPHOS, BILITOT, PROT, ALBUMIN in the last 168 hours. No results for input(s): LIPASE, AMYLASE in the last 168 hours. No results for input(s): AMMONIA in the last 168 hours. CBC:  Recent Labs Lab 09/27/16 0251 09/28/16 0303  WBC 23.5* 19.5*  HGB 14.1 14.2  HCT 42.1 44.6  MCV 98.6 99.1  PLT 284 316   Cardiac Enzymes: No results for input(s): CKTOTAL, CKMB, CKMBINDEX, TROPONINI in the last 168 hours. BNP: Invalid input(s): POCBNP CBG:  Recent Labs Lab 09/30/16 0737 09/30/16 1134  GLUCAP 94 114*    Significant Diagnostic Studies:  Dg Chest Portable 1 View  Result Date: 09/24/2016 CLINICAL DATA:  Shortness of breath.  History of COPD EXAM: PORTABLE CHEST 1 VIEW COMPARISON:  August 09, 2016 FINDINGS: There is evidence of upper lobe bullous disease. There is no edema or consolidation. Relative prominence of the interstitium in the lung bases is probably due to redistribution of blood flow to viable lung segments. Heart size is within normal limits. Pulmonary vascularity reflects upper lobe bullous disease. No adenopathy. No bone lesions. IMPRESSION: Bilateral upper lobe bullous disease, stable. No edema or consolidation. Stable cardiac silhouette. Electronically Signed   By: Lowella Grip III M.D.   On: 09/24/2016 16:30    2D ECHO: Study Conclusions  - Left ventricle: The cavity size was normal. Wall thickness was   normal. Systolic function was normal. The estimated ejection   fraction was in the range of 55% to 60%. Wall motion was normal;   there were no regional wall motion abnormalities.  Doppler   parameters are consistent with abnormal left ventricular   relaxation (grade 1 diastolic dysfunction). - Mitral valve: Calcified annulus.  Impressions:  - Normal LV systolic function; mild diastolic dysfunction.  Disposition and Follow-up: Discharge Instructions    Diet Carb Modified    Complete by:  As directed    Discharge instructions    Complete by:  As directed    It is VERY IMPORTANT that you follow up with a PCP on a regular basis.  Check your blood glucoses before breakfast and at bedtime and maintain a log of your readings.  Bring this log with you when you follow up with your PCP at your follow up visit.  Please review your medications and changes made before you start all the medications at home.   Increase activity slowly    Complete by:  As directed        DISPOSITION:  Home    DISCHARGE FOLLOW-UP Follow-up Information    Rogers Blocker, MD Follow up on 10/07/2016.   Specialty:  Internal Medicine Why:  please keep your appt  Contact information: [address] [place] [address] [phone]        Brand Males, MD. Schedule an appointment as soon as possible for a visit in 11 day(s).   Specialty:  Pulmonary Disease Why:  office will call you with appt  Contact information: [place] [address] [phone]            Time spent on Discharge: 35 mins    Signed:   Estill Cotta M.D. [facility] 09/30/2016, 1:29 PM Pager: [phone]

## 2016-09-30 NOTE — Telephone Encounter (Signed)
Pt is still currently admitted. Will call pt once she is discharged.

## 2016-09-30 NOTE — Progress Notes (Signed)
STAFF NOTE: I, Dr Ann Lions have personally reviewed patient's available data, including medical history, events of note, physical examination and test results as part of my evaluation. I have discussed with resident/NP and other care providers such as pharmacist, RN and RRT.  In addition,  I personally evaluated patient and elicited key findings of   S: she feels better. Wants to go home. Lot of baseline hoarse voice. S/p multiple ENT/bronch evals with "dry throast" dx per APP  O: obese Sitting inchair hoparse voice with fluctating quality CTA bilaterlaly No post nasal drip Soft abd    A: aecopd VCD OSA CPAP ANxiety Smoker   - all impropved  P: dc home Bd and steroid taper as outlined (see app note)  No future appointments. - phone system down at [place] so will send a computer EHR mesage    .  Rest per NP/medical resident whose note is outlined above and that I agree with   Dr. Brand Males, M.D., [facility].C.P Pulmonary and Critical Care Medicine Staff Physician [place] [facility] Pager: [phone], If no answer or between  15:00h - 7:00h: call [phone]  09/30/2016 11:32 AM

## 2016-09-30 NOTE — Progress Notes (Signed)
Physical Therapy Treatment Patient Details Name: Joan [name] MRN: [id number] DOB: Jun 26, 1960 Today's Date: 09/30/2016    History of Present Illness The patient is a 56 year old female who recently moved to [facility] area, with history of COPD, ongoing tobacco use, OSA on nightly CPAP, diabetes presented with worsening dyspnea, wheezing. In ED was noted to be in respirator distress, tachycardia, hypotensive, tachypneic. The patient was given a dose of Solu-Medrol, bronchodilators, placed on BiPAP. She was admitted to stepdown unit.    PT Comments    Pt presented in bed asleep. Pt awoke to greeting and was ready to participate in therapy. Pt performed bed mobility independently on room air. Pt was Mod I for ambulation needing increased time and a rolling walker for fatigue and alleviation of back pain. Pt ambulated 200 ft with rolling walker, and 100 ft without rolling walker and O2 saturation did not fall below 90% on room air. Pt was educated about pursed lip breathing and paced breathing to help with feelings of breathlessness and demonstrated knowledge of techniques at end of session. Pt reported slight lightheadedness at beginning of ambulation that resolved promptly. Pt c/o breathlessness while re-entering bed but O2 saturation did no fall below 92 % while preforming task on room air. Pt will benefit from home health PT upon discharge until enrollment into pulmonary discharge can be facilitated   Full PT note to follow  Drue Stager, SPT  Roney Marion, [place] Pager [phone] Office [phone]

## 2016-10-01 NOTE — Care Management (Signed)
Late Entry:  Charge RN received call from this pt re medication refills for medications that she was taking prior to this hospitalization. This CM reviewed pt d/c instructions and noted that the pt was taking these meds upon admission. Call was placed to the pt to inform her that the hospitalist do not write prescriptions for medications to be refilled .  Spoke with the pt and explained to her that she needs to contact her PCP for refills or any new meds at this time as she is requesting a prescription for Chantix. Pt was very understanding and verbalized understanding, stating that she would contact her PCP.

## 2016-10-04 NOTE — Care Management Note (Signed)
Case Management Note       LATE ENTRY  Patient Details  Name: Joan [name] MRN: [id number] Date of Birth: 21-Aug-1960  Subjective/Objective:                 Action/Plan: 10/04/2016 Received call from pt stating that she had not heard from her [facility] provider as yet. This CM contacted [facility] re [facility] and was informed that upon further review [facility] would not be able to provide [facility] services to this pt due to her insurance. They however had failed to notify the pt or this CM . Multiple calls made and this CM was able to arrange [facility] services for this pt from [facility] with services to begin in the next 48 hr. This CM placed call to the pt and explained this problem with her original [facility] agency, made apologies and pt states that she understands and is willing to have [facility] provide her [facility] services.   Expected Discharge Date:  09/30/16               Expected Discharge Plan:  [place]  In-House Referral:  NA  Discharge planning Services  CM Consult  Post Acute Care Choice:  Durable Medical Equipment, Home Health Choice offered to:  Patient  DME Arranged:  Nebulizer machine DME Agency:  [facility] Arranged:  RN, PT, OT, Nurse's Aide [facility] Agency:  [facility]  Status of Service:  Completed, signed off  If discussed at [place] of Stay Meetings, dates discussed:    Additional Comments:  Adron Bene, RN 10/04/2016, 1:30 PM

## 2016-10-06 DIAGNOSIS — M1990 Unspecified osteoarthritis, unspecified site: Secondary | ICD-10-CM | POA: Diagnosis not present

## 2016-10-06 DIAGNOSIS — M25561 Pain in right knee: Secondary | ICD-10-CM | POA: Diagnosis not present

## 2016-10-06 DIAGNOSIS — R69 Illness, unspecified: Secondary | ICD-10-CM | POA: Diagnosis not present

## 2016-10-06 DIAGNOSIS — J9611 Chronic respiratory failure with hypoxia: Secondary | ICD-10-CM | POA: Diagnosis not present

## 2016-10-06 DIAGNOSIS — E039 Hypothyroidism, unspecified: Secondary | ICD-10-CM | POA: Diagnosis not present

## 2016-10-06 DIAGNOSIS — E119 Type 2 diabetes mellitus without complications: Secondary | ICD-10-CM | POA: Diagnosis not present

## 2016-10-06 DIAGNOSIS — G4733 Obstructive sleep apnea (adult) (pediatric): Secondary | ICD-10-CM | POA: Diagnosis not present

## 2016-10-06 DIAGNOSIS — Z7951 Long term (current) use of inhaled steroids: Secondary | ICD-10-CM | POA: Diagnosis not present

## 2016-10-06 DIAGNOSIS — M25551 Pain in right hip: Secondary | ICD-10-CM | POA: Diagnosis not present

## 2016-10-06 DIAGNOSIS — B1920 Unspecified viral hepatitis C without hepatic coma: Secondary | ICD-10-CM | POA: Diagnosis not present

## 2016-10-06 DIAGNOSIS — J441 Chronic obstructive pulmonary disease with (acute) exacerbation: Secondary | ICD-10-CM | POA: Diagnosis not present

## 2016-10-07 DIAGNOSIS — R2681 Unsteadiness on feet: Secondary | ICD-10-CM | POA: Diagnosis not present

## 2016-10-07 DIAGNOSIS — M79606 Pain in leg, unspecified: Secondary | ICD-10-CM | POA: Diagnosis not present

## 2016-10-07 DIAGNOSIS — E669 Obesity, unspecified: Secondary | ICD-10-CM | POA: Diagnosis not present

## 2016-10-07 DIAGNOSIS — K469 Unspecified abdominal hernia without obstruction or gangrene: Secondary | ICD-10-CM | POA: Diagnosis not present

## 2016-10-08 NOTE — Telephone Encounter (Signed)
Patient is calling to schedule a hospital follow up with Dr. Chase Caller. Patient stated per discharge summary she need to be schedule within 10 days.

## 2016-10-08 NOTE — Telephone Encounter (Signed)
ATC patient several times, number on chart is non-working per automated message No DPR on file. Will hold to follow up 10/09/16

## 2016-10-09 DIAGNOSIS — R69 Illness, unspecified: Secondary | ICD-10-CM | POA: Diagnosis not present

## 2016-10-09 NOTE — Telephone Encounter (Signed)
atc pt again, only number listed is a non-working number.  Letter sent to pt to contact office and update demographic info- if pt happens to call back she needs to be scheduled for next available hospital follow-up appt with any NP.    Will close this encounter.

## 2016-10-14 ENCOUNTER — Ambulatory Visit (INDEPENDENT_AMBULATORY_CARE_PROVIDER_SITE_OTHER): Payer: Medicare HMO | Admitting: Acute Care

## 2016-10-14 ENCOUNTER — Encounter: Payer: Self-pay | Admitting: Acute Care

## 2016-10-14 VITALS — BP 118/86 | HR 82 | Ht 67.0 in | Wt 203.6 lb

## 2016-10-14 DIAGNOSIS — J449 Chronic obstructive pulmonary disease, unspecified: Secondary | ICD-10-CM | POA: Diagnosis not present

## 2016-10-14 DIAGNOSIS — J441 Chronic obstructive pulmonary disease with (acute) exacerbation: Secondary | ICD-10-CM

## 2016-10-14 MED ORDER — CLONAZEPAM 0.5 MG PO TABS: 0.2500 mg | ORAL_TABLET | Freq: Two times a day (BID) | ORAL | 0 refills | Status: DC

## 2016-10-14 NOTE — Assessment & Plan Note (Signed)
Resolving Plan We will request previous records from you pulmonologist in [place], [place]. ( Dr. Francisco Capuchin [phone]) Continue your nebs 4 times daily as you have been doing. Continue your Symbicort, Spiriva and Singulair daily as you have been doing We will make sure you have adequate Continue protonix as you have been doing. Rinse mouth after use.,  Referral Pulmonary rehab Continue CPAP every night. Follow up appointment for CPAP/ OSA.with Dr. Elsworth Soho or Halford Chessman, NP Schedule PFT's to stage your COPD prior to visit with Dr. Chase Caller. Klonopin 0.25 mg twice daily until Dr. Chase Caller sees patient 11/25/2016 Follow up with Dr. Chase Caller in 11/25/2016 Flu shot in the fall. Please contact office for sooner follow up if symptoms do not improve or worsen or seek emergency care

## 2016-10-14 NOTE — Patient Instructions (Addendum)
It is nice to meet you today. We will request previous records from you pulmonologist in [place], [place]. ( Dr. Francisco Capuchin [phone]) Continue your nebs 4 times daily as you have been doing. Continue your Symbicort, Spiriva and Singulair daily as you have been doing We will make sure you have adequate Continue protonix as you have been doing. Rinse mouth after use.,  Referral Pulmonary rehab Continue CPAP every night. Follow up appointment for CPAP/ OSA.with Dr. Elsworth Soho or Halford Chessman, NP Schedule PFT's to stage your COPD prior to visit with Dr. Chase Caller. Klonopin 0.25 mg twice daily until Dr. Chase Caller sees patient 11/25/2016 Follow up with Dr. Chase Caller in 11/25/2016 Flu shot in the fall. Please contact office for sooner follow up if symptoms do not improve or worsen or seek emergency care

## 2016-10-14 NOTE — Progress Notes (Addendum)
History of Present Illness Joan [name] is a 56 y.o. female current smoker with COPD and VCD. She was stared on home oxygen at discharge 09/30/2016. She is new to the practice after hospitalization for exacerbation.She was previously seen by a pulmonologist in [place].   10/14/2016 Hospital Follow Up: Pt presents for follow up. She was admitted for COPD exacerbation 09/24/2016-09/30/2016. She was treated with BiPAP, oxygen therapy, scheduled bronchodilators, and IV steroids. She presents today for follow-up.   She states she has been doing well. She states the klonopin she is taking has helped with her anxiety, and vocal cord dysfunction, and therefore her shortness of breath. She continues to smoke. She started on Chantix today, with the goal of quitting. She states her breathing is horrible. She continues to smoke. She states she is compliant with her Symbicort twice daily , She is using her Xopenex nebulizers 3  times daily, instead of the 4 times daily but they have been prescribed. She was given a neb machine upon discharge from the hospital. She states she does not like this neb machine and she is going to see if her insurance company provide her with another one. She is compliant with her Spiriva, Daliresp, and Singulair.Her voice is hoarse, as she has VCD. She is coughing up white to yellow secretions. She states she has post nasal gtt.She states she does not have any swelling in her feet or legs.She denies fever, chest pain, orthopnea or hemoptysis. She did follow-up with her PCP. She is in no apparent distress in the office today. She states she is compliant with her CPAP machine nightly.  Test Results:  CBC Latest Ref Rng & Units 09/28/2016 09/27/2016 09/26/2016  WBC 4.0 - 10.5 K/uL 19.5(H) 23.5(H) 24.8(H)  Hemoglobin 12.0 - 15.0 g/dL 14.2 14.1 13.9  Hematocrit 36.0 - 46.0 % 44.6 42.1 41.8  Platelets 150 - 400 K/uL 316 284 279    BMP Latest Ref Rng & Units 09/28/2016 09/27/2016 09/26/2016    Glucose 65 - 99 mg/dL 202(H) 217(H) 292(H)  BUN 6 - 20 mg/dL 33(H) 29(H) 22(H)  Creatinine 0.44 - 1.00 mg/dL 1.02(H) 0.91 0.97  Sodium 135 - 145 mmol/L 139 140 140  Potassium 3.5 - 5.1 mmol/L 4.5 4.6 4.4  Chloride 101 - 111 mmol/L 103 104 105  CO2 22 - 32 mmol/L 26 27 24   Calcium 8.9 - 10.3 mg/dL 9.0 9.2 9.0       Dg Chest Port 1 View  Result Date: 09/27/2016 CLINICAL DATA:  56 y/o  F; wheezing. EXAM: PORTABLE CHEST 1 VIEW COMPARISON:  09/24/2016 chest radiograph. FINDINGS: Stable cardiac silhouette within normal limits. Upper lobe emphysema is stable. No consolidation, effusion, or pneumothorax. No acute osseous abnormality is evident. IMPRESSION: Stable upper lobe emphysema.  No acute pulmonary process identified. Electronically Signed   By: Kristine Garbe M.D.   On: 09/27/2016 15:58   Dg Chest Portable 1 View  Result Date: 09/24/2016 CLINICAL DATA:  Shortness of breath.  History of COPD EXAM: PORTABLE CHEST 1 VIEW COMPARISON:  August 09, 2016 FINDINGS: There is evidence of upper lobe bullous disease. There is no edema or consolidation. Relative prominence of the interstitium in the lung bases is probably due to redistribution of blood flow to viable lung segments. Heart size is within normal limits. Pulmonary vascularity reflects upper lobe bullous disease. No adenopathy. No bone lesions. IMPRESSION: Bilateral upper lobe bullous disease, stable. No edema or consolidation. Stable cardiac silhouette. Electronically Signed   By: Gwyndolyn Saxon  Jasmine December III M.D.   On: 09/24/2016 16:30     Past medical hx Past Medical History:  Diagnosis Date  . Anxiety   . Arthritis    "knees, hips, lower back" (09/25/2016)  . Asthma   . Bipolar disorder ([place])   . Chronic bronchitis ([place])    "all the time" (09/25/2016)  . Chronic leg pain    RLE  . Chronic lower back pain   . COPD (chronic obstructive pulmonary disease) ([place])   . Depression   . Emphysema of lung ([place])   . Hepatitis C    "treated  back in 2001-2002"  . High cholesterol   . Hypothyroidism   . OSA on CPAP   . Pneumonia    "all the time" (09/25/2016)  . Thyroid disease   . Type II diabetes mellitus ([place])      Social History  Substance Use Topics  . Smoking status: Current Every Day Smoker    Packs/day: 1.00    Years: 44.00    Types: Cigarettes  . Smokeless tobacco: Never Used     Comment: pt started chantix 10/14/16, currently smoking .75 ppd  . Alcohol use Yes     Comment: 09/25/2016 "stopped a long time ago"    Joan [name] reports that she has been smoking Cigarettes.  She has a 44.00 pack-year smoking history. She has never used smokeless tobacco. She reports that she drinks alcohol. She reports that she uses drugs, including "Crack" cocaine.  Tobacco Cessation: I have spent 5 minutes counseling patient on smoking cessation this visit. We reviewed the risks to her health of continued tobacco abuse. These included the fact that continued tobacco abuse is contributing to her shortness of breath.  Past surgical hx, Family hx, Social hx all reviewed.  Current Outpatient Prescriptions on File Prior to Visit  Medication Sig  . atorvastatin (LIPITOR) 20 MG tablet Take 20 mg by mouth daily.  . baclofen (LIORESAL) 10 MG tablet Take 10 mg by mouth at bedtime.  . budesonide-formoterol (SYMBICORT) 160-4.5 MCG/ACT inhaler Inhale 2 puffs into the lungs 2 (two) times daily.  Marland Kitchen levalbuterol (XOPENEX) 0.63 MG/3ML nebulizer solution Take 3 mLs (0.63 mg total) by nebulization 4 (four) times daily.  . metoprolol tartrate (LOPRESSOR) 25 MG tablet Take 1 tablet (25 mg total) by mouth 2 (two) times daily.  . montelukast (SINGULAIR) 10 MG tablet Take 10 mg by mouth at bedtime.  . pantoprazole (PROTONIX) 40 MG tablet Take 1 tablet (40 mg total) by mouth 2 (two) times daily before a meal.  . pregabalin (LYRICA) 75 MG capsule Take 1 capsule (75 mg total) by mouth 2 (two) times daily.  . roflumilast (DALIRESP) 500 MCG TABS tablet Take 1  tablet (500 mcg total) by mouth daily.  Marland Kitchen tiotropium (SPIRIVA) 18 MCG inhalation capsule Place 1 capsule (18 mcg total) into inhaler and inhale daily.  . valACYclovir (VALTREX) 500 MG tablet Take 500 mg by mouth daily.   Marland Kitchen venlafaxine XR (EFFEXOR-XR) 150 MG 24 hr capsule Take 150 mg by mouth daily with breakfast.   No current facility-administered medications on file prior to visit.      Allergies  Allergen Reactions  . Dilaudid [Hydromorphone Hcl] Other (See Comments)    "heavy sedation"  . Keflex [Cephalexin] Other (See Comments)    unspecified  . Prednisone Other (See Comments)    "counter reacts"  . Shellfish Allergy Other (See Comments)    Stomach cramps  . Tetracyclines & Related Other (See Comments)  unspecified    Review Of Systems:  Constitutional:   No  weight loss, night sweats,  Fevers, chills, fatigue, or  lassitude.  HEENT:   No headaches,  Difficulty swallowing,  Tooth/dental problems, or  Sore throat,                No sneezing, itching, ear ache,+ nasal congestion, +post nasal drip,   CV:  No chest pain,  Orthopnea, PND, swelling in lower extremities, anasarca, dizziness, palpitations, syncope.   GI  No heartburn, indigestion, abdominal pain, nausea, vomiting, diarrhea, change in bowel habits, loss of appetite, bloody stools.   Resp: + shortness of breath with exertion or at rest.  No excess mucus, + productive cough,  No non-productive cough,  No coughing up of blood.  No change in color of mucus.  + wheezing.  No chest wall deformity  Skin: no rash or lesions.  GU: no dysuria, change in color of urine, no urgency or frequency.  No flank pain, no hematuria   MS:  No joint pain or swelling.  No decreased range of motion.  No back pain.  Psych:  No change in mood or affect. No depression or anxiety.  No memory loss.   Vital Signs BP 118/86 (BP Location: Left Arm, Patient Position: Sitting, Cuff Size: Normal)   Pulse 82   Ht 5\' 7"  (1.702 m)   Wt 203 lb  9.6 oz (92.4 kg)   SpO2 97%   BMI 31.89 kg/m    Physical Exam:  General- No distress,  A&Ox3, poor historian, difficult to keep on task, smells of cigarette smoke ENT: No sinus tenderness, TM clear, pale nasal mucosa, no oral exudate,+ post nasal drip, no LAN Cardiac: S1, S2, regular rate and rhythm, no murmur Chest: No wheeze/ rales/ dullness; no accessory muscle use, no nasal flaring, no sternal retractions, diminished bilaterally in the bases Abd.: Soft Non-tender, nondistended, bowel sounds positive, obese Ext: No clubbing cyanosis, edema Neuro:  normal strength Skin: No rashes, warm and dry Psych: normal mood and behavior   Assessment/Plan  COPD exacerbation (HCC) Resolving Plan We will request previous records from you pulmonologist in [place], [place]. ( Dr. Francisco Capuchin [phone]) Continue your nebs 4 times daily as you have been doing. Continue your Symbicort, Spiriva and Singulair daily as you have been doing We will make sure you have adequate Continue protonix as you have been doing. Rinse mouth after use.,  Referral Pulmonary rehab Continue CPAP every night. Follow up appointment for CPAP/ OSA.with Dr. Elsworth Soho or Halford Chessman, NP Schedule PFT's to stage your COPD prior to visit with Dr. Chase Caller. Klonopin 0.25 mg twice daily until Dr. Chase Caller sees patient 11/25/2016 Follow up with Dr. Chase Caller in 11/25/2016 Flu shot in the fall. Please contact office for sooner follow up if symptoms do not improve or worsen or seek emergency care       [name], NP 10/14/2016  5:34 PM

## 2016-10-15 DIAGNOSIS — G4733 Obstructive sleep apnea (adult) (pediatric): Secondary | ICD-10-CM | POA: Diagnosis not present

## 2016-10-15 DIAGNOSIS — M1990 Unspecified osteoarthritis, unspecified site: Secondary | ICD-10-CM | POA: Diagnosis not present

## 2016-10-15 DIAGNOSIS — R69 Illness, unspecified: Secondary | ICD-10-CM | POA: Diagnosis not present

## 2016-10-15 DIAGNOSIS — B1920 Unspecified viral hepatitis C without hepatic coma: Secondary | ICD-10-CM | POA: Diagnosis not present

## 2016-10-15 DIAGNOSIS — Z7951 Long term (current) use of inhaled steroids: Secondary | ICD-10-CM | POA: Diagnosis not present

## 2016-10-15 DIAGNOSIS — J9611 Chronic respiratory failure with hypoxia: Secondary | ICD-10-CM | POA: Diagnosis not present

## 2016-10-15 DIAGNOSIS — M25561 Pain in right knee: Secondary | ICD-10-CM | POA: Diagnosis not present

## 2016-10-15 DIAGNOSIS — E039 Hypothyroidism, unspecified: Secondary | ICD-10-CM | POA: Diagnosis not present

## 2016-10-15 DIAGNOSIS — J441 Chronic obstructive pulmonary disease with (acute) exacerbation: Secondary | ICD-10-CM | POA: Diagnosis not present

## 2016-10-15 DIAGNOSIS — E119 Type 2 diabetes mellitus without complications: Secondary | ICD-10-CM | POA: Diagnosis not present

## 2016-10-16 DIAGNOSIS — G4733 Obstructive sleep apnea (adult) (pediatric): Secondary | ICD-10-CM | POA: Diagnosis not present

## 2016-10-16 DIAGNOSIS — M1990 Unspecified osteoarthritis, unspecified site: Secondary | ICD-10-CM | POA: Diagnosis not present

## 2016-10-16 DIAGNOSIS — R69 Illness, unspecified: Secondary | ICD-10-CM | POA: Diagnosis not present

## 2016-10-16 DIAGNOSIS — E039 Hypothyroidism, unspecified: Secondary | ICD-10-CM | POA: Diagnosis not present

## 2016-10-16 DIAGNOSIS — Z7951 Long term (current) use of inhaled steroids: Secondary | ICD-10-CM | POA: Diagnosis not present

## 2016-10-16 DIAGNOSIS — E119 Type 2 diabetes mellitus without complications: Secondary | ICD-10-CM | POA: Diagnosis not present

## 2016-10-16 DIAGNOSIS — B1920 Unspecified viral hepatitis C without hepatic coma: Secondary | ICD-10-CM | POA: Diagnosis not present

## 2016-10-16 DIAGNOSIS — J9611 Chronic respiratory failure with hypoxia: Secondary | ICD-10-CM | POA: Diagnosis not present

## 2016-10-16 DIAGNOSIS — M25561 Pain in right knee: Secondary | ICD-10-CM | POA: Diagnosis not present

## 2016-10-16 DIAGNOSIS — J441 Chronic obstructive pulmonary disease with (acute) exacerbation: Secondary | ICD-10-CM | POA: Diagnosis not present

## 2016-10-17 DIAGNOSIS — J449 Chronic obstructive pulmonary disease, unspecified: Secondary | ICD-10-CM | POA: Diagnosis not present

## 2016-10-19 DIAGNOSIS — E119 Type 2 diabetes mellitus without complications: Secondary | ICD-10-CM | POA: Diagnosis not present

## 2016-10-19 DIAGNOSIS — M25561 Pain in right knee: Secondary | ICD-10-CM | POA: Diagnosis not present

## 2016-10-19 DIAGNOSIS — G4733 Obstructive sleep apnea (adult) (pediatric): Secondary | ICD-10-CM | POA: Diagnosis not present

## 2016-10-19 DIAGNOSIS — J9611 Chronic respiratory failure with hypoxia: Secondary | ICD-10-CM | POA: Diagnosis not present

## 2016-10-19 DIAGNOSIS — B1920 Unspecified viral hepatitis C without hepatic coma: Secondary | ICD-10-CM | POA: Diagnosis not present

## 2016-10-19 DIAGNOSIS — Z7951 Long term (current) use of inhaled steroids: Secondary | ICD-10-CM | POA: Diagnosis not present

## 2016-10-19 DIAGNOSIS — E039 Hypothyroidism, unspecified: Secondary | ICD-10-CM | POA: Diagnosis not present

## 2016-10-19 DIAGNOSIS — J441 Chronic obstructive pulmonary disease with (acute) exacerbation: Secondary | ICD-10-CM | POA: Diagnosis not present

## 2016-10-19 DIAGNOSIS — R69 Illness, unspecified: Secondary | ICD-10-CM | POA: Diagnosis not present

## 2016-10-19 DIAGNOSIS — M1990 Unspecified osteoarthritis, unspecified site: Secondary | ICD-10-CM | POA: Diagnosis not present

## 2016-10-21 ENCOUNTER — Telehealth: Payer: Self-pay | Admitting: Acute Care

## 2016-10-21 DIAGNOSIS — B1920 Unspecified viral hepatitis C without hepatic coma: Secondary | ICD-10-CM | POA: Diagnosis not present

## 2016-10-21 DIAGNOSIS — E039 Hypothyroidism, unspecified: Secondary | ICD-10-CM | POA: Diagnosis not present

## 2016-10-21 DIAGNOSIS — M25561 Pain in right knee: Secondary | ICD-10-CM | POA: Diagnosis not present

## 2016-10-21 DIAGNOSIS — J9611 Chronic respiratory failure with hypoxia: Secondary | ICD-10-CM | POA: Diagnosis not present

## 2016-10-21 DIAGNOSIS — R69 Illness, unspecified: Secondary | ICD-10-CM | POA: Diagnosis not present

## 2016-10-21 DIAGNOSIS — J441 Chronic obstructive pulmonary disease with (acute) exacerbation: Secondary | ICD-10-CM | POA: Diagnosis not present

## 2016-10-21 DIAGNOSIS — E119 Type 2 diabetes mellitus without complications: Secondary | ICD-10-CM | POA: Diagnosis not present

## 2016-10-21 DIAGNOSIS — Z7951 Long term (current) use of inhaled steroids: Secondary | ICD-10-CM | POA: Diagnosis not present

## 2016-10-21 DIAGNOSIS — M1990 Unspecified osteoarthritis, unspecified site: Secondary | ICD-10-CM | POA: Diagnosis not present

## 2016-10-21 DIAGNOSIS — G4733 Obstructive sleep apnea (adult) (pediatric): Secondary | ICD-10-CM | POA: Diagnosis not present

## 2016-10-21 NOTE — Telephone Encounter (Signed)
There is no other medication if she is unable to take  Albuterol.

## 2016-10-21 NOTE — Telephone Encounter (Signed)
Spoke with patient. She stated that [facility] does not carry Xopenex 0.63mg /76mL and she would to see if Judson Roch could call in a similar medication for her. She stated that she can not tolerate albuterol. She discovered while in the hospital, it makes her have tachycardia.   The original RX for Xopenex came from Dr. Estill Cotta.   SG, please advise. Thanks!

## 2016-10-21 NOTE — Telephone Encounter (Signed)
Left message for patient to call back  

## 2016-10-22 NOTE — Telephone Encounter (Signed)
lmomtcb x 2  

## 2016-10-23 DIAGNOSIS — M1990 Unspecified osteoarthritis, unspecified site: Secondary | ICD-10-CM | POA: Diagnosis not present

## 2016-10-23 DIAGNOSIS — E119 Type 2 diabetes mellitus without complications: Secondary | ICD-10-CM | POA: Diagnosis not present

## 2016-10-23 DIAGNOSIS — J441 Chronic obstructive pulmonary disease with (acute) exacerbation: Secondary | ICD-10-CM | POA: Diagnosis not present

## 2016-10-23 DIAGNOSIS — J9611 Chronic respiratory failure with hypoxia: Secondary | ICD-10-CM | POA: Diagnosis not present

## 2016-10-23 DIAGNOSIS — G4733 Obstructive sleep apnea (adult) (pediatric): Secondary | ICD-10-CM | POA: Diagnosis not present

## 2016-10-23 DIAGNOSIS — E039 Hypothyroidism, unspecified: Secondary | ICD-10-CM | POA: Diagnosis not present

## 2016-10-23 DIAGNOSIS — R69 Illness, unspecified: Secondary | ICD-10-CM | POA: Diagnosis not present

## 2016-10-23 DIAGNOSIS — M25561 Pain in right knee: Secondary | ICD-10-CM | POA: Diagnosis not present

## 2016-10-23 DIAGNOSIS — Z7951 Long term (current) use of inhaled steroids: Secondary | ICD-10-CM | POA: Diagnosis not present

## 2016-10-23 DIAGNOSIS — B1920 Unspecified viral hepatitis C without hepatic coma: Secondary | ICD-10-CM | POA: Diagnosis not present

## 2016-10-23 NOTE — Telephone Encounter (Signed)
Called and spoke with pt and she is aware of SG recs.  She stated that she will have to stay on the xopenex and she will call when she needs this refilled from [facility].

## 2016-10-24 ENCOUNTER — Encounter: Payer: Self-pay | Admitting: Family Medicine

## 2016-10-24 ENCOUNTER — Telehealth: Payer: Self-pay | Admitting: Acute Care

## 2016-10-24 ENCOUNTER — Ambulatory Visit (INDEPENDENT_AMBULATORY_CARE_PROVIDER_SITE_OTHER): Payer: Medicare HMO | Admitting: Family Medicine

## 2016-10-24 ENCOUNTER — Telehealth: Payer: Self-pay | Admitting: Internal Medicine

## 2016-10-24 VITALS — BP 140/96 | HR 62 | Temp 98.0°F | Ht 66.0 in | Wt 208.0 lb

## 2016-10-24 DIAGNOSIS — E1165 Type 2 diabetes mellitus with hyperglycemia: Secondary | ICD-10-CM | POA: Diagnosis not present

## 2016-10-24 DIAGNOSIS — Z720 Tobacco use: Secondary | ICD-10-CM | POA: Diagnosis not present

## 2016-10-24 DIAGNOSIS — Z7689 Persons encountering health services in other specified circumstances: Secondary | ICD-10-CM | POA: Diagnosis not present

## 2016-10-24 DIAGNOSIS — J449 Chronic obstructive pulmonary disease, unspecified: Secondary | ICD-10-CM | POA: Diagnosis not present

## 2016-10-24 NOTE — Patient Instructions (Addendum)
During today's office visit, we discussed the possibility of sending you to another provider that can help with your anxiety.  If this is something you are interested in please let us know so that we can place the referral for you.  In the mean time, we will be unable to fill some of your medications.  We will also not be able to fill out the form for your handicap placcard until we have your records from your previous providers.      How to Kick the Smoking Habit   Why should I quit smoking?  Quitting smoking is the most important thing you can do for your health. Smoking can cause cancer, lung disease, heart disease, and many other health problems. Secondhand smoke can be dangerous too. It can cause lung cancer and heart disease in adults. It can make asthma worse or cause ear infections in kids.   You'll see benefits as soon as you quit smoking. Your heart rate and blood pressure will go down. You'll breathe easier. It will be easier to exercise. Your sense of smell and taste will be better. You'll lower your risk of cancer, lung disease, and heart disease. You'll even live longer!   Why is it so hard to quit smoking?  Nicotine is a strong drug. Your body becomes addicted to nicotine when you smoke. You may have withdrawal symptoms or cravings when you stop smoking. You may become anxious or irritable. You might have trouble sleeping or want to eat more. These symptoms are usually worst the first week after quitting. The good news is nicotine withdrawal symptoms only last a few weeks for most people.  The routines and habits that go along with smoking can make it tough to quit too. Some people often smoke a cigarette when they drive, after a meal, or when they're on the phone. Smoking can become a part of these routines. After you quit smoking these habits can be a trigger to make you want to smoke again. It's important to separate smoking from these routines when you quit.  How can I make  it easier to quit?  You don't have to quit "cold [place]." You can double or triple the chance that you'll stop smoking if you use a medicine and counseling together. There are many medicines available. These medicines work in different ways to help manage nicotine withdrawal. Many can be bought off the shelves at your local pharmacy. Some require a prescription. Talk to your pharmacist or prescriber about what medicines may be right for you.  It is very important to have counseling when you quit. Medicines can help you cope with nicotine withdrawal. Counseling can help you develop skills to break smoking habits. There are lots of counseling options available. Many of these are free. Some options are local support groups, telephone quitlines, online services, and texting programs.   Start thinking now about how you plan to quit. Think about why you want to quit. Look at triggers that make you want to smoke. Plan for challenges you might face when trying to quit. Talk to your pharmacist about how to get help.   Where can I learn more?  Toll-free Quitlines and Websites:  In the [place].: 1-800-QUIT-NOW(1-419-520-1153); http://smokefree.gov    These websites include online support, live chat, and text messaging programs.

## 2016-10-24 NOTE — Progress Notes (Addendum)
Patient presents to clinic today to establish care.  SUBJECTIVE: PMH:  Pt formerly seen by Dr. Kevan Ny and NP on [facility].  Pt has a pmh sig for COPD, DM II, tobacco , anxiety, tachycardia, OSA.  Pt requesting the following meds: Tizanidine, Atenolol, Levothyroxine 25 mcg, and Naproxen.  Per pt's papers these were d/c'd at time of discharge from hospital.  Chart review shows subclinical hypothyroidism.  Pt advised will need to gather further info on the above.  COPD -Followed by Pulmonology -does not require O2 -On Symbicort, Daliresp, Spiriva, has  Xopenex nebs at home -Smoking <1pk per day -States allergic to prednisone.  During an acute COPD exacerbation, Solumedrol works.  DM II -Per pt diet controlled -Formerly on Metformin -States mentions when hospitalized so they will "watch my sugar" -Was d/c'd from hospital with insulin, but is not taking.  Does not check fsbs b/c "I forget"  Tobacco use -Smoking <1 ppd -was smoking 4 ppd -has been using Chantix.  Started last Monday -Also mentions, need ENT consult as voice is hoarse.  States sees them twice a yr.  Anxiety -Taking Klonopin 1/2 tab in am (b/c makes her sleepy) and 1 tab at night. -Pt states she needs the med for her tachycardia. -Pt states she does not need to see Psych.  OSA -per pt, central sleep apnea -CPAP x 5-6 yrs -needs new sleep study?  Pt endorses pain in R leg and R knee.  Pt states pain in R leg is s/p a cardiac cath yrs ago.  She states the pain starts in groin and goes down her leg.  She states her R knee pain is excruciating and goes from her knee down. Pt states it is hard to sit in the chair 2/2 to her pain.  Allergies: Tetracycline: SOB, states "will kill her" Keflex: "doesn't work for me"  Rash Shellfish: "stomach sick"  Bad pains Prednisone:  LE edema, SOB     SurgHx: Umbilical hernia s/p mesh placement and removal.   Pt states she needs new mesh placed.  Social hx: Pt states she  moved to the area from [place], but she is originally from [place].  Pt is smoking <1 ppd, on chantix.  Pt denies EtOH or drug use.  Pt has been clean from crack cocaine since 2013.  She states she entered a program to help her with her habit.  She got started when a friend offered her some and showed her how to smoke it.  FMHx: -Mom: desc 2/2 MVC  Heart condition, DM -Dad: desc -5 sisters: have heart conditions -4 brothers:  Health Maintenance: Dental -- in the process of getting new dentures.    Past Medical History:  Diagnosis Date  . Anxiety   . Arthritis    "knees, hips, lower back" (09/25/2016)  . Asthma   . Bipolar disorder ([place])   . Chronic bronchitis ([place])    "all the time" (09/25/2016)  . Chronic leg pain    RLE  . Chronic lower back pain   . COPD (chronic obstructive pulmonary disease) ([place])   . Depression   . Emphysema of lung ([place])   . Hepatitis C    "treated back in 2001-2002"  . High cholesterol   . Hypothyroidism   . OSA on CPAP   . Pneumonia    "all the time" (09/25/2016)  . Thyroid disease   . Type II diabetes mellitus ([place])     Past Surgical History:  Procedure Laterality  Date  . BACK SURGERY    . CARDIAC CATHETERIZATION    . COLONOSCOPY    . CYST EXCISION     removal of cyst in sinuses  . DILATION AND CURETTAGE OF UTERUS    . FOOT SURGERY Bilateral    bone removed from 4th and 5 th toes and put in pin then took pin out  . HERNIA REPAIR    . INCISION AND DRAINAGE  ~ 2014/2015   "removed mesh & infection"  . LACERATION REPAIR Right    repair wrist artery from laceration from ice skate  . LIPOMA EXCISION Right    fattty lipoma in neck  . NASAL SEPTUM SURGERY    . POSTERIOR LUMBAR FUSION  2015/2016 X 2   "added rods and screws"  . TUBAL LIGATION    . UMBILICAL HERNIA REPAIR  ~ 2013   w/mesh  . UPPER GI ENDOSCOPY      Current Outpatient Prescriptions on File Prior to Visit  Medication Sig Dispense Refill  . atorvastatin (LIPITOR) 20 MG  tablet Take 20 mg by mouth daily.    . baclofen (LIORESAL) 10 MG tablet Take 10 mg by mouth at bedtime.    . budesonide-formoterol (SYMBICORT) 160-4.5 MCG/ACT inhaler Inhale 2 puffs into the lungs 2 (two) times daily. 1 Inhaler 12  . clonazePAM (KLONOPIN) 0.5 MG tablet Take 0.5 tablets (0.25 mg total) by mouth 2 (two) times daily. 45 tablet 0  . levalbuterol (XOPENEX) 0.63 MG/3ML nebulizer solution Take 3 mLs (0.63 mg total) by nebulization 4 (four) times daily. 60 mL 12  . metoprolol tartrate (LOPRESSOR) 25 MG tablet Take 1 tablet (25 mg total) by mouth 2 (two) times daily. 60 tablet 3  . montelukast (SINGULAIR) 10 MG tablet Take 10 mg by mouth at bedtime.    . pantoprazole (PROTONIX) 40 MG tablet Take 1 tablet (40 mg total) by mouth 2 (two) times daily before a meal. 60 tablet 3  . pregabalin (LYRICA) 75 MG capsule Take 1 capsule (75 mg total) by mouth 2 (two) times daily. 60 capsule 0  . roflumilast (DALIRESP) 500 MCG TABS tablet Take 1 tablet (500 mcg total) by mouth daily. 30 tablet 1  . tiotropium (SPIRIVA) 18 MCG inhalation capsule Place 1 capsule (18 mcg total) into inhaler and inhale daily. 30 capsule 12  . valACYclovir (VALTREX) 500 MG tablet Take 500 mg by mouth daily.     . varenicline (CHANTIX PAK) 0.5 MG X 11 & 1 MG X 42 tablet Take by mouth 2 (two) times daily. Take one 0.5 mg tablet by mouth once daily for 3 days, then increase to one 0.5 mg tablet twice daily for 4 days, then increase to one 1 mg tablet twice daily.    Marland Kitchen venlafaxine XR (EFFEXOR-XR) 150 MG 24 hr capsule Take 150 mg by mouth daily with breakfast.     No current facility-administered medications on file prior to visit.     Allergies  Allergen Reactions  . Dilaudid [Hydromorphone Hcl] Other (See Comments)    "heavy sedation"  . Keflex [Cephalexin] Other (See Comments)    unspecified  . Prednisone Other (See Comments)    "counter reacts"  . Shellfish Allergy Other (See Comments)    Stomach cramps  .  Tetracyclines & Related Other (See Comments)    unspecified    Family History  Problem Relation Age of Onset  . Diabetes Mother   . Hypertension Mother   . Hypertension Father   . Cancer  Father        lung    Social History   Social History  . Marital status: Widowed    Spouse name: N/A  . Number of children: N/A  . Years of education: N/A   Occupational History  . Not on file.   Social History Main Topics  . Smoking status: Current Every Day Smoker    Packs/day: 1.00    Years: 44.00    Types: Cigarettes  . Smokeless tobacco: Never Used     Comment: pt started chantix 10/14/16, currently smoking .75 ppd  . Alcohol use Yes     Comment: 09/25/2016 "stopped a long time ago"  . Drug use: Yes    Types: "Crack" cocaine     Comment: 09/25/2016 "stopped 5 yr ago"  . Sexual activity: No   Other Topics Concern  . Not on file   Social History Narrative  . No narrative on file    ROS  General: Denies fever, chills, night sweats, changes in weight, changes in appetite HEENT: Denies headaches, ear pain, changes in vision, rhinorrhea, sore throat CV: Denies CP, palpitations, orthopnea  +SOB, tachycardia Pulm: Denies cough, wheezing  +SOB GI: Denies abdominal pain, nausea, vomiting, diarrhea, constipation GU: Denies dysuria, hematuria, frequency, vaginal discharge Msk: Denies muscle cramps   R leg and knee pain Neuro: Denies weakness, numbness, tingling Skin: Denies rashes, bruising Psych: Denies depression, hallucinations   +anxiety   BP (!) 140/96 (BP Location: Left Arm, Patient Position: Sitting, Cuff Size: Normal)   Pulse 62   Temp 98 F (36.7 C) (Oral)   Ht 5\' 6"  (1.676 m)   Wt 208 lb (94.3 kg)   SpO2 96%   BMI 33.57 kg/m   Physical Exam  Gen. Pleasant, well developed, well-nourished, in NAD.  Sitting comfortably in chair. HEENT - [place]/AT, PERRL, no scleral icterus, no nasal drainage, pharynx without erythema or exudate. Hoarse voice. Neck: No JVD, no  thyromegaly Lungs: no use of accessory muscles, Rhonchi in b/l bases. Cardiovascular: RRR, No r/g/m, no peripheral edema. Abdomen: BS present, soft, nontender,nondistended, no hepatosplenomegaly Musculoskeletal: No deformities, moves all four extremities, normal tone  Neuro:  A&Ox3, CN II-XII intact, normal gait, ambulates without difficulty. Skin:  Warm, dry, intact, no lesions Psych: normal affect.   Assessment/Plan: COPD, unspecified ([place]) -Followed by Pulm -Encouraged to quit smoking -Continue Symbicort, spiriva, daliresp.  Xopenex prn  Tobacco abuse -smoking cessation >3 min, <40min. -smoking <1 ppd -Discussed cutting down on cigarettes smoked per day. -Continue Chantix -given 1-800-Quit now info  Controlled type 2 diabetes mellitus with hyperglycemia, without long-term current use of insulin ([place]) -diet controlled, now preDM Lab Results  Component Value Date   HGBA1C 5.9 (H) 09/24/2016  -continue lifestyle modifications   Encounter to establish care with new doctor -Will obtain records from other providers.  Pt seemed upset/irritated that this provider would not complete a handicap placard form for her during this visit, nor send her to a surgeon immediately. -Pt also advised that this provider would NOT be rx'ing any narcotics or klonopin.  Referral to Psych offered for Klonopin and help dealing with chronic medical issues.  Pt not interested at this time.

## 2016-10-24 NOTE — Telephone Encounter (Signed)
Called stating her daughter is a patient of Dr. Sharlet Salina.  She would like to establish with Dr. Sharlet Salina.  Please advise.

## 2016-10-24 NOTE — Telephone Encounter (Signed)
Form completed and left in brown accordion folder up front for pickup.    lmtcb X1 to make pt aware.

## 2016-10-25 ENCOUNTER — Telehealth: Payer: Self-pay | Admitting: Internal Medicine

## 2016-10-25 DIAGNOSIS — E039 Hypothyroidism, unspecified: Secondary | ICD-10-CM | POA: Diagnosis not present

## 2016-10-25 DIAGNOSIS — Z7951 Long term (current) use of inhaled steroids: Secondary | ICD-10-CM | POA: Diagnosis not present

## 2016-10-25 DIAGNOSIS — M1990 Unspecified osteoarthritis, unspecified site: Secondary | ICD-10-CM | POA: Diagnosis not present

## 2016-10-25 DIAGNOSIS — G4733 Obstructive sleep apnea (adult) (pediatric): Secondary | ICD-10-CM | POA: Diagnosis not present

## 2016-10-25 DIAGNOSIS — J9611 Chronic respiratory failure with hypoxia: Secondary | ICD-10-CM | POA: Diagnosis not present

## 2016-10-25 DIAGNOSIS — R69 Illness, unspecified: Secondary | ICD-10-CM | POA: Diagnosis not present

## 2016-10-25 DIAGNOSIS — M25561 Pain in right knee: Secondary | ICD-10-CM | POA: Diagnosis not present

## 2016-10-25 DIAGNOSIS — B1920 Unspecified viral hepatitis C without hepatic coma: Secondary | ICD-10-CM | POA: Diagnosis not present

## 2016-10-25 DIAGNOSIS — J441 Chronic obstructive pulmonary disease with (acute) exacerbation: Secondary | ICD-10-CM | POA: Diagnosis not present

## 2016-10-25 DIAGNOSIS — E119 Type 2 diabetes mellitus without complications: Secondary | ICD-10-CM | POA: Diagnosis not present

## 2016-10-25 NOTE — Telephone Encounter (Signed)
Called and spoke with Colletta Maryland from [facility] and she stated that she came in earlier this week to see the pt and the pt c/o shortness of breath with walking to the mailbox and back.  Resting made this go away.    Today the pt still had these c/o so stephanie had the pt go and make her bed ( what consisted of her straightening the sheet and fluffing the pillow) and she was at 88% after doing this and felt SOB.  Colletta Maryland wanted to see what other steps need to be taken.  MR please advise. Pt has a scheduled appt with you on 10/1

## 2016-10-25 NOTE — Telephone Encounter (Signed)
lmomtcb x 2  

## 2016-10-29 NOTE — Telephone Encounter (Signed)
Called and spoke with pt and she is aware that these forms have been completed and she will pick these up tomorrow at her appt.

## 2016-10-30 ENCOUNTER — Ambulatory Visit (INDEPENDENT_AMBULATORY_CARE_PROVIDER_SITE_OTHER): Payer: Medicare HMO | Admitting: Internal Medicine

## 2016-10-30 DIAGNOSIS — Z7951 Long term (current) use of inhaled steroids: Secondary | ICD-10-CM | POA: Diagnosis not present

## 2016-10-30 DIAGNOSIS — R69 Illness, unspecified: Secondary | ICD-10-CM | POA: Diagnosis not present

## 2016-10-30 DIAGNOSIS — J449 Chronic obstructive pulmonary disease, unspecified: Secondary | ICD-10-CM

## 2016-10-30 DIAGNOSIS — B1920 Unspecified viral hepatitis C without hepatic coma: Secondary | ICD-10-CM | POA: Diagnosis not present

## 2016-10-30 DIAGNOSIS — M1990 Unspecified osteoarthritis, unspecified site: Secondary | ICD-10-CM | POA: Diagnosis not present

## 2016-10-30 DIAGNOSIS — E119 Type 2 diabetes mellitus without complications: Secondary | ICD-10-CM | POA: Diagnosis not present

## 2016-10-30 DIAGNOSIS — J9611 Chronic respiratory failure with hypoxia: Secondary | ICD-10-CM | POA: Diagnosis not present

## 2016-10-30 DIAGNOSIS — G4733 Obstructive sleep apnea (adult) (pediatric): Secondary | ICD-10-CM | POA: Diagnosis not present

## 2016-10-30 DIAGNOSIS — E039 Hypothyroidism, unspecified: Secondary | ICD-10-CM | POA: Diagnosis not present

## 2016-10-30 DIAGNOSIS — M25561 Pain in right knee: Secondary | ICD-10-CM | POA: Diagnosis not present

## 2016-10-30 DIAGNOSIS — J441 Chronic obstructive pulmonary disease with (acute) exacerbation: Secondary | ICD-10-CM | POA: Diagnosis not present

## 2016-10-30 LAB — PULMONARY FUNCTION TEST
DL/VA % pred: 42 %
DL/VA: 2.14 ml/min/mmHg/L
DLCO cor % pred: 35 %
DLCO cor: 9.52 ml/min/mmHg
DLCO unc % pred: 34 %
DLCO unc: 9.4 ml/min/mmHg
FEF 25-75 Post: 0.71 L/sec
FEF 25-75 Pre: 0.44 L/sec
FEF2575-%Change-Post: 60 %
FEF2575-%Pred-Post: 26 %
FEF2575-%Pred-Pre: 16 %
FEV1-%Change-Post: 16 %
FEV1-%Pred-Post: 42 %
FEV1-%Pred-Pre: 36 %
FEV1-Post: 1.23 L
FEV1-Pre: 1.05 L
FEV1FVC-%Change-Post: 0 %
FEV1FVC-%Pred-Pre: 61 %
FEV6-%Change-Post: 19 %
FEV6-%Pred-Post: 69 %
FEV6-%Pred-Pre: 57 %
FEV6-Post: 2.47 L
FEV6-Pre: 2.07 L
FEV6FVC-%Change-Post: 1 %
FEV6FVC-%Pred-Post: 100 %
FEV6FVC-%Pred-Pre: 99 %
FVC-%Change-Post: 17 %
FVC-%Pred-Post: 68 %
FVC-%Pred-Pre: 58 %
FVC-Post: 2.53 L
FVC-Pre: 2.16 L
Post FEV1/FVC ratio: 49 %
Post FEV6/FVC ratio: 98 %
Pre FEV1/FVC ratio: 49 %
Pre FEV6/FVC Ratio: 96 %
RV % pred: 147 %
RV: 2.93 L
TLC % pred: 100 %
TLC: 5.36 L

## 2016-10-30 NOTE — Progress Notes (Signed)
PFT done today. 

## 2016-10-30 NOTE — Telephone Encounter (Signed)
Fine

## 2016-10-30 NOTE — Telephone Encounter (Signed)
I do not know this patient that well. Is she on o2 at home? I do not see clear cut evidence she is based on Joan [name] note on 10/14/16. If she is not she might need to come in for a qualifying visit. However, if symptoms are acute deterioration then she needs to come in for acute visit

## 2016-10-30 NOTE — Telephone Encounter (Signed)
LMTCB for [employer]

## 2016-10-30 NOTE — Telephone Encounter (Signed)
Patient scheduled.

## 2016-11-01 DIAGNOSIS — Z7951 Long term (current) use of inhaled steroids: Secondary | ICD-10-CM | POA: Diagnosis not present

## 2016-11-01 DIAGNOSIS — J441 Chronic obstructive pulmonary disease with (acute) exacerbation: Secondary | ICD-10-CM | POA: Diagnosis not present

## 2016-11-01 DIAGNOSIS — M1990 Unspecified osteoarthritis, unspecified site: Secondary | ICD-10-CM | POA: Diagnosis not present

## 2016-11-01 DIAGNOSIS — B1920 Unspecified viral hepatitis C without hepatic coma: Secondary | ICD-10-CM | POA: Diagnosis not present

## 2016-11-01 DIAGNOSIS — G4733 Obstructive sleep apnea (adult) (pediatric): Secondary | ICD-10-CM | POA: Diagnosis not present

## 2016-11-01 DIAGNOSIS — R69 Illness, unspecified: Secondary | ICD-10-CM | POA: Diagnosis not present

## 2016-11-01 DIAGNOSIS — E039 Hypothyroidism, unspecified: Secondary | ICD-10-CM | POA: Diagnosis not present

## 2016-11-01 DIAGNOSIS — M25561 Pain in right knee: Secondary | ICD-10-CM | POA: Diagnosis not present

## 2016-11-01 DIAGNOSIS — E119 Type 2 diabetes mellitus without complications: Secondary | ICD-10-CM | POA: Diagnosis not present

## 2016-11-01 DIAGNOSIS — J9611 Chronic respiratory failure with hypoxia: Secondary | ICD-10-CM | POA: Diagnosis not present

## 2016-11-04 DIAGNOSIS — I10 Essential (primary) hypertension: Secondary | ICD-10-CM | POA: Diagnosis not present

## 2016-11-04 DIAGNOSIS — E669 Obesity, unspecified: Secondary | ICD-10-CM | POA: Diagnosis not present

## 2016-11-04 DIAGNOSIS — R062 Wheezing: Secondary | ICD-10-CM | POA: Diagnosis not present

## 2016-11-04 DIAGNOSIS — J449 Chronic obstructive pulmonary disease, unspecified: Secondary | ICD-10-CM | POA: Diagnosis not present

## 2016-11-05 ENCOUNTER — Telehealth: Payer: Self-pay | Admitting: Internal Medicine

## 2016-11-05 NOTE — Telephone Encounter (Signed)
Spoke with pt, she states Joan Mire NP gave her an Rx for Chantix, but she admitted to smoking a couple times and she discontinued the Rx. She states she wants to be on the medication and she feels like the Chantix was helping her cut down and doesn't understand why it was stopped. Can we send in this medication for pt?  [facility]  SG you were the last provider to see her. Please advise.

## 2016-11-05 NOTE — Telephone Encounter (Signed)
Error

## 2016-11-05 NOTE — Telephone Encounter (Signed)
She needs to renew the prescription with the provider who originally ordered it. I'm not sure  of the reason that it was discontinued. Thank you

## 2016-11-05 NOTE — Telephone Encounter (Signed)
Called and spoke with pt and she is aware of SG recs.  She will be changing to the PCP at [place] [facility].  She will follow up with them for the rx for the chantix.

## 2016-11-05 NOTE — Telephone Encounter (Signed)
Duplicate message. See other telephone encounter (11/05/2016).

## 2016-11-05 NOTE — Telephone Encounter (Signed)
Spoke with pt. She is requesting a refill on Chantix. Pt is currently taking 1mg  BID.  MR - please advise if we can refill this. Thanks!

## 2016-11-06 ENCOUNTER — Ambulatory Visit: Payer: Medicare HMO | Admitting: Physical Therapy

## 2016-11-06 DIAGNOSIS — E119 Type 2 diabetes mellitus without complications: Secondary | ICD-10-CM | POA: Diagnosis not present

## 2016-11-06 DIAGNOSIS — J9611 Chronic respiratory failure with hypoxia: Secondary | ICD-10-CM | POA: Diagnosis not present

## 2016-11-06 DIAGNOSIS — B1920 Unspecified viral hepatitis C without hepatic coma: Secondary | ICD-10-CM | POA: Diagnosis not present

## 2016-11-06 DIAGNOSIS — Z7951 Long term (current) use of inhaled steroids: Secondary | ICD-10-CM | POA: Diagnosis not present

## 2016-11-06 DIAGNOSIS — R69 Illness, unspecified: Secondary | ICD-10-CM | POA: Diagnosis not present

## 2016-11-06 DIAGNOSIS — M25561 Pain in right knee: Secondary | ICD-10-CM | POA: Diagnosis not present

## 2016-11-06 DIAGNOSIS — G4733 Obstructive sleep apnea (adult) (pediatric): Secondary | ICD-10-CM | POA: Diagnosis not present

## 2016-11-06 DIAGNOSIS — M1990 Unspecified osteoarthritis, unspecified site: Secondary | ICD-10-CM | POA: Diagnosis not present

## 2016-11-06 DIAGNOSIS — J441 Chronic obstructive pulmonary disease with (acute) exacerbation: Secondary | ICD-10-CM | POA: Diagnosis not present

## 2016-11-06 DIAGNOSIS — E039 Hypothyroidism, unspecified: Secondary | ICD-10-CM | POA: Diagnosis not present

## 2016-11-06 NOTE — Telephone Encounter (Signed)
Ok to refill chantix -    1 mg twice daily for 11 weeks   - if you have bad dream stop medication and call us  - take the medicaiton as instructed and after food  - HAS SHE QUIT SMOKING?

## 2016-11-06 NOTE — Telephone Encounter (Signed)
LMTCB

## 2016-11-07 NOTE — Telephone Encounter (Signed)
Called spoke with Colletta Maryland who reports she will be seeing patient tomorrow Per Colletta Maryland, pt does NOT use O2 currently at home and is still smoking, had to stop the Chantix because she is still smoking Per Colletta Maryland, pt was NOT acute at the last visit and she feels this will be a chronic issue  Colletta Maryland will call the office next week with update and recommendations  This is a triage message, initiated by Ashtyn when she was covering the phones so triage will be able to move forward with this when [place] calls the office back

## 2016-11-09 DIAGNOSIS — R69 Illness, unspecified: Secondary | ICD-10-CM | POA: Diagnosis not present

## 2016-11-12 DIAGNOSIS — G4733 Obstructive sleep apnea (adult) (pediatric): Secondary | ICD-10-CM | POA: Diagnosis not present

## 2016-11-12 DIAGNOSIS — Z7951 Long term (current) use of inhaled steroids: Secondary | ICD-10-CM | POA: Diagnosis not present

## 2016-11-12 DIAGNOSIS — E039 Hypothyroidism, unspecified: Secondary | ICD-10-CM | POA: Diagnosis not present

## 2016-11-12 DIAGNOSIS — J9611 Chronic respiratory failure with hypoxia: Secondary | ICD-10-CM | POA: Diagnosis not present

## 2016-11-12 DIAGNOSIS — M1990 Unspecified osteoarthritis, unspecified site: Secondary | ICD-10-CM | POA: Diagnosis not present

## 2016-11-12 DIAGNOSIS — B1920 Unspecified viral hepatitis C without hepatic coma: Secondary | ICD-10-CM | POA: Diagnosis not present

## 2016-11-12 DIAGNOSIS — E119 Type 2 diabetes mellitus without complications: Secondary | ICD-10-CM | POA: Diagnosis not present

## 2016-11-12 DIAGNOSIS — R69 Illness, unspecified: Secondary | ICD-10-CM | POA: Diagnosis not present

## 2016-11-12 DIAGNOSIS — M25561 Pain in right knee: Secondary | ICD-10-CM | POA: Diagnosis not present

## 2016-11-12 DIAGNOSIS — J441 Chronic obstructive pulmonary disease with (acute) exacerbation: Secondary | ICD-10-CM | POA: Diagnosis not present

## 2016-11-13 DIAGNOSIS — K432 Incisional hernia without obstruction or gangrene: Secondary | ICD-10-CM | POA: Diagnosis not present

## 2016-11-13 DIAGNOSIS — Z720 Tobacco use: Secondary | ICD-10-CM | POA: Diagnosis not present

## 2016-11-13 DIAGNOSIS — J449 Chronic obstructive pulmonary disease, unspecified: Secondary | ICD-10-CM | POA: Diagnosis not present

## 2016-11-13 DIAGNOSIS — E039 Hypothyroidism, unspecified: Secondary | ICD-10-CM | POA: Diagnosis not present

## 2016-11-13 DIAGNOSIS — R7303 Prediabetes: Secondary | ICD-10-CM | POA: Diagnosis not present

## 2016-11-13 DIAGNOSIS — K219 Gastro-esophageal reflux disease without esophagitis: Secondary | ICD-10-CM | POA: Diagnosis not present

## 2016-11-13 DIAGNOSIS — Z6832 Body mass index (BMI) 32.0-32.9, adult: Secondary | ICD-10-CM | POA: Diagnosis not present

## 2016-11-14 DIAGNOSIS — G4733 Obstructive sleep apnea (adult) (pediatric): Secondary | ICD-10-CM | POA: Diagnosis not present

## 2016-11-14 DIAGNOSIS — R69 Illness, unspecified: Secondary | ICD-10-CM | POA: Diagnosis not present

## 2016-11-14 DIAGNOSIS — B1920 Unspecified viral hepatitis C without hepatic coma: Secondary | ICD-10-CM | POA: Diagnosis not present

## 2016-11-14 DIAGNOSIS — E119 Type 2 diabetes mellitus without complications: Secondary | ICD-10-CM | POA: Diagnosis not present

## 2016-11-14 DIAGNOSIS — J9611 Chronic respiratory failure with hypoxia: Secondary | ICD-10-CM | POA: Diagnosis not present

## 2016-11-14 DIAGNOSIS — M25561 Pain in right knee: Secondary | ICD-10-CM | POA: Diagnosis not present

## 2016-11-14 DIAGNOSIS — J441 Chronic obstructive pulmonary disease with (acute) exacerbation: Secondary | ICD-10-CM | POA: Diagnosis not present

## 2016-11-14 DIAGNOSIS — E039 Hypothyroidism, unspecified: Secondary | ICD-10-CM | POA: Diagnosis not present

## 2016-11-14 DIAGNOSIS — Z7951 Long term (current) use of inhaled steroids: Secondary | ICD-10-CM | POA: Diagnosis not present

## 2016-11-14 DIAGNOSIS — M1990 Unspecified osteoarthritis, unspecified site: Secondary | ICD-10-CM | POA: Diagnosis not present

## 2016-11-15 ENCOUNTER — Other Ambulatory Visit: Payer: Self-pay | Admitting: General Surgery

## 2016-11-15 DIAGNOSIS — M1990 Unspecified osteoarthritis, unspecified site: Secondary | ICD-10-CM | POA: Diagnosis not present

## 2016-11-15 DIAGNOSIS — G4733 Obstructive sleep apnea (adult) (pediatric): Secondary | ICD-10-CM | POA: Diagnosis not present

## 2016-11-15 DIAGNOSIS — E039 Hypothyroidism, unspecified: Secondary | ICD-10-CM | POA: Diagnosis not present

## 2016-11-15 DIAGNOSIS — M25561 Pain in right knee: Secondary | ICD-10-CM | POA: Diagnosis not present

## 2016-11-15 DIAGNOSIS — J441 Chronic obstructive pulmonary disease with (acute) exacerbation: Secondary | ICD-10-CM | POA: Diagnosis not present

## 2016-11-15 DIAGNOSIS — K432 Incisional hernia without obstruction or gangrene: Secondary | ICD-10-CM

## 2016-11-15 DIAGNOSIS — Z7951 Long term (current) use of inhaled steroids: Secondary | ICD-10-CM | POA: Diagnosis not present

## 2016-11-15 DIAGNOSIS — B1920 Unspecified viral hepatitis C without hepatic coma: Secondary | ICD-10-CM | POA: Diagnosis not present

## 2016-11-15 DIAGNOSIS — E119 Type 2 diabetes mellitus without complications: Secondary | ICD-10-CM | POA: Diagnosis not present

## 2016-11-15 DIAGNOSIS — J9611 Chronic respiratory failure with hypoxia: Secondary | ICD-10-CM | POA: Diagnosis not present

## 2016-11-15 DIAGNOSIS — R69 Illness, unspecified: Secondary | ICD-10-CM | POA: Diagnosis not present

## 2016-11-18 DIAGNOSIS — J449 Chronic obstructive pulmonary disease, unspecified: Secondary | ICD-10-CM | POA: Diagnosis not present

## 2016-11-18 DIAGNOSIS — E669 Obesity, unspecified: Secondary | ICD-10-CM | POA: Diagnosis not present

## 2016-11-18 DIAGNOSIS — Z09 Encounter for follow-up examination after completed treatment for conditions other than malignant neoplasm: Secondary | ICD-10-CM | POA: Diagnosis not present

## 2016-11-20 ENCOUNTER — Telehealth: Payer: Self-pay | Admitting: Internal Medicine

## 2016-11-20 DIAGNOSIS — J441 Chronic obstructive pulmonary disease with (acute) exacerbation: Secondary | ICD-10-CM | POA: Diagnosis not present

## 2016-11-20 DIAGNOSIS — B1920 Unspecified viral hepatitis C without hepatic coma: Secondary | ICD-10-CM | POA: Diagnosis not present

## 2016-11-20 DIAGNOSIS — R69 Illness, unspecified: Secondary | ICD-10-CM | POA: Diagnosis not present

## 2016-11-20 DIAGNOSIS — M25561 Pain in right knee: Secondary | ICD-10-CM | POA: Diagnosis not present

## 2016-11-20 DIAGNOSIS — Z7951 Long term (current) use of inhaled steroids: Secondary | ICD-10-CM | POA: Diagnosis not present

## 2016-11-20 DIAGNOSIS — E119 Type 2 diabetes mellitus without complications: Secondary | ICD-10-CM | POA: Diagnosis not present

## 2016-11-20 DIAGNOSIS — J9611 Chronic respiratory failure with hypoxia: Secondary | ICD-10-CM | POA: Diagnosis not present

## 2016-11-20 DIAGNOSIS — E039 Hypothyroidism, unspecified: Secondary | ICD-10-CM | POA: Diagnosis not present

## 2016-11-20 DIAGNOSIS — M1990 Unspecified osteoarthritis, unspecified site: Secondary | ICD-10-CM | POA: Diagnosis not present

## 2016-11-20 DIAGNOSIS — G4733 Obstructive sleep apnea (adult) (pediatric): Secondary | ICD-10-CM | POA: Diagnosis not present

## 2016-11-20 NOTE — Telephone Encounter (Signed)
Called and spoke to pt. Pt states she already has an appt on 11/25/2016 with MR.   Will forward to MR as FYI.

## 2016-11-20 NOTE — Telephone Encounter (Signed)
Got note from Dr Craige Cotta of [facility] - has hernia surgery upcming and he wants her to quit smokng and he wants preop resp consult. Plese hae Clarise Cruz or TP eval  Dr. Brand Males, M.D., [facility].C.P Pulmonary and Critical Care Medicine Staff Physician [place] [facility] Pager: [phone], If no answer or between  15:00h - 7:00h: call [phone]  11/20/2016 4:34 PM

## 2016-11-21 ENCOUNTER — Ambulatory Visit
Admission: RE | Admit: 2016-11-21 | Discharge: 2016-11-21 | Disposition: A | Discharge status: Home / Self Care | Payer: Medicare HMO | Source: Ambulatory Visit | Attending: General Surgery | Admitting: General Surgery

## 2016-11-21 DIAGNOSIS — K429 Umbilical hernia without obstruction or gangrene: Secondary | ICD-10-CM | POA: Diagnosis not present

## 2016-11-21 DIAGNOSIS — K432 Incisional hernia without obstruction or gangrene: Secondary | ICD-10-CM

## 2016-11-21 MED ORDER — IOPAMIDOL (ISOVUE-300) INJECTION 61%
100.0000 mL | Freq: Once | INTRAVENOUS | Status: AC | PRN
  Administered 2016-11-21: 100 mL via INTRAVENOUS

## 2016-11-22 ENCOUNTER — Telehealth: Payer: Self-pay | Admitting: Internal Medicine

## 2016-11-22 DIAGNOSIS — M160 Bilateral primary osteoarthritis of hip: Secondary | ICD-10-CM | POA: Diagnosis not present

## 2016-11-22 DIAGNOSIS — G4733 Obstructive sleep apnea (adult) (pediatric): Secondary | ICD-10-CM | POA: Diagnosis not present

## 2016-11-22 DIAGNOSIS — M25561 Pain in right knee: Secondary | ICD-10-CM | POA: Diagnosis not present

## 2016-11-22 DIAGNOSIS — J9611 Chronic respiratory failure with hypoxia: Secondary | ICD-10-CM | POA: Diagnosis not present

## 2016-11-22 DIAGNOSIS — Z7951 Long term (current) use of inhaled steroids: Secondary | ICD-10-CM | POA: Diagnosis not present

## 2016-11-22 DIAGNOSIS — E039 Hypothyroidism, unspecified: Secondary | ICD-10-CM | POA: Diagnosis not present

## 2016-11-22 DIAGNOSIS — E119 Type 2 diabetes mellitus without complications: Secondary | ICD-10-CM | POA: Diagnosis not present

## 2016-11-22 DIAGNOSIS — J441 Chronic obstructive pulmonary disease with (acute) exacerbation: Secondary | ICD-10-CM | POA: Diagnosis not present

## 2016-11-22 DIAGNOSIS — B1920 Unspecified viral hepatitis C without hepatic coma: Secondary | ICD-10-CM | POA: Diagnosis not present

## 2016-11-22 DIAGNOSIS — M1990 Unspecified osteoarthritis, unspecified site: Secondary | ICD-10-CM | POA: Diagnosis not present

## 2016-11-22 DIAGNOSIS — R69 Illness, unspecified: Secondary | ICD-10-CM | POA: Diagnosis not present

## 2016-11-22 NOTE — Telephone Encounter (Signed)
Spoke with Saralyn Pilar with [facility] is aware that patient should pace herself over the weekend,rest when needed, and if SOB gets worse through the weekend seek emergency care. Otherwise patient will see MR on Monday 11/25/16 and they can assess O2 needs then.  Nothing more needed at this time.

## 2016-11-22 NOTE — Telephone Encounter (Signed)
Spoke with Saralyn Pilar with [facility], who states during pt's PT on Wednesday pt's O2 levels were between 84-85% during a 2 minute walk test.  At rest spO2 is 93-94%. Pt is not currently on oxygen.  Pt was d/c on 09/30/16 for resp failure.  Pt is scheduled is for OV with MR on 11-25-16.    JN please advise, as MR is unavailable. Thanks.

## 2016-11-23 DIAGNOSIS — R69 Illness, unspecified: Secondary | ICD-10-CM | POA: Diagnosis not present

## 2016-11-25 ENCOUNTER — Telehealth: Payer: Self-pay | Admitting: Internal Medicine

## 2016-11-25 ENCOUNTER — Ambulatory Visit (INDEPENDENT_AMBULATORY_CARE_PROVIDER_SITE_OTHER): Payer: Medicare HMO | Admitting: Internal Medicine

## 2016-11-25 ENCOUNTER — Encounter: Payer: Self-pay | Admitting: Internal Medicine

## 2016-11-25 ENCOUNTER — Other Ambulatory Visit: Payer: Medicare HMO

## 2016-11-25 VITALS — BP 138/84 | HR 67 | Ht 67.0 in | Wt 208.4 lb

## 2016-11-25 DIAGNOSIS — E119 Type 2 diabetes mellitus without complications: Secondary | ICD-10-CM | POA: Diagnosis not present

## 2016-11-25 DIAGNOSIS — R490 Dysphonia: Secondary | ICD-10-CM

## 2016-11-25 DIAGNOSIS — J441 Chronic obstructive pulmonary disease with (acute) exacerbation: Secondary | ICD-10-CM | POA: Diagnosis not present

## 2016-11-25 DIAGNOSIS — R0600 Dyspnea, unspecified: Secondary | ICD-10-CM | POA: Diagnosis not present

## 2016-11-25 DIAGNOSIS — R0602 Shortness of breath: Secondary | ICD-10-CM | POA: Diagnosis not present

## 2016-11-25 DIAGNOSIS — M25561 Pain in right knee: Secondary | ICD-10-CM | POA: Diagnosis not present

## 2016-11-25 DIAGNOSIS — J9611 Chronic respiratory failure with hypoxia: Secondary | ICD-10-CM | POA: Diagnosis not present

## 2016-11-25 DIAGNOSIS — B1920 Unspecified viral hepatitis C without hepatic coma: Secondary | ICD-10-CM | POA: Diagnosis not present

## 2016-11-25 DIAGNOSIS — J449 Chronic obstructive pulmonary disease, unspecified: Secondary | ICD-10-CM | POA: Diagnosis not present

## 2016-11-25 DIAGNOSIS — Z87891 Personal history of nicotine dependence: Secondary | ICD-10-CM

## 2016-11-25 DIAGNOSIS — Z01811 Encounter for preprocedural respiratory examination: Secondary | ICD-10-CM

## 2016-11-25 DIAGNOSIS — R0689 Other abnormalities of breathing: Secondary | ICD-10-CM

## 2016-11-25 DIAGNOSIS — R69 Illness, unspecified: Secondary | ICD-10-CM | POA: Diagnosis not present

## 2016-11-25 DIAGNOSIS — M1990 Unspecified osteoarthritis, unspecified site: Secondary | ICD-10-CM | POA: Diagnosis not present

## 2016-11-25 DIAGNOSIS — Z23 Encounter for immunization: Secondary | ICD-10-CM

## 2016-11-25 DIAGNOSIS — Z7951 Long term (current) use of inhaled steroids: Secondary | ICD-10-CM | POA: Diagnosis not present

## 2016-11-25 DIAGNOSIS — G4733 Obstructive sleep apnea (adult) (pediatric): Secondary | ICD-10-CM | POA: Diagnosis not present

## 2016-11-25 DIAGNOSIS — E039 Hypothyroidism, unspecified: Secondary | ICD-10-CM | POA: Diagnosis not present

## 2016-11-25 NOTE — Patient Instructions (Addendum)
ICD-10-CM   1. Stage 3 severe COPD by GOLD classification ([place]) J44.9   2. Dyspnea and respiratory abnormalities R06.00    R06.89   3. Stopped smoking with greater than 40 pack year history Z87.891   4. Raspy voice R49.0   5. Preop respiratory exam Z01.811    Severe copd but currently wihtout flare up Significant symptoms + Glad you quit smoking  plan DO CT scan chest without contrast Do alpha 1 genetic blood test for copd Do ONO on room air while you take bipap for your sleep apnea Glad you quit smoking - remain quit Continue spiriv aand symbicort Flu shot 11/25/2016   Followup - next few week with app but after completing abve - will discuss pulmonary rehab at followup - consider ENT eval at followup for raspy voice esp with history of smoking - - preop eval for incisional abdominal wall hernia based on results  - quitting smoking x 8 weeks and rehab can help you withstand surgery better

## 2016-11-25 NOTE — Telephone Encounter (Signed)
Spoke with Joan [name] and verified the information. She was going to get a 6 minute walk here but Joan [name] states she will fail the test because she desats under 2 minutes of exertion. She thinks she needs supplemental oxygen but she was on 95% room air today in clinic. She has not had a cigarette since Friday and she is having hernia surgery next week. She is concerned about her oxygen level dropping with minimal exertion. MR please advise.

## 2016-11-25 NOTE — Progress Notes (Signed)
Subjective:     Patient ID: Joan [name], female   DOB: 1960/11/12, 56 y.o.   MRN: [id number]  HPI   OV 11/25/2016  Chief Complaint  Patient presents with  . Follow-up    Pt states that she is having worsening SOB, states that it is all the time and not just with exertion. Pt has a hernia and was needing to see a pulmonary doctor before surgery could be performed. States that she does cough every day with thick yellow to white mucus. Denies any CP.  Follow-up COPD with strong history of smoking. Immigrant from [place]  She migrated from [place] to be with her brother [place]. She moved here in June 2018. This my first office visit with her for COPD. She tells me that she was diagnosed with COPD 10 years ago. Never been on oxygen except once after an exacerbation and admission. She uses BiPAP for sleep apnea for sleep apnea at night. But she does not use oxygen with it. She seldomly for the last 1 year she is having progressive symptoms of shortness of breath and cough particularly shortness of breath. Even climbing half flight of stairs makes her short of breath. Her voice is raspy. This is associated significant fatigue. She has an incisional hernia for which she saw Dr. Fanny Skates and he has recommended preoperative pulmonary evaluation in quitting smoking for 8 weeks. This is part of her questions today. She is on Chantix for smoking in remission. She only quit 2 days ago. There are no acute symptoms. She is on Spiriva and Symbicort for her COPD for many years   Results for ALDA, GAULTNEY (MRN [id number]) as of 11/25/2016 10:09  Ref. Range 10/30/2016 08:47  FEV1-Post Latest Units: L 1.23  FEV1-%Pred-Post Latest Units: % 42  FEV1-%Change-Post Latest Units: % 16  Post FEV1/FVC ratio Latest Units: % 49   Results for BRETTE, CAST (MRN [id number]) as of 11/25/2016 10:09  Ref. Range 10/30/2016 08:47  DLCO cor Latest Units: ml/min/mmHg 9.52  DLCO cor % pred Latest Units: % 35    has  a past medical history of Anxiety; Arthritis; Asthma; Bipolar disorder ([place]); Chronic bronchitis ([place]); Chronic leg pain; Chronic lower back pain; COPD (chronic obstructive pulmonary disease) ([place]); Depression; Emphysema of lung ([place]); Hepatitis C; High cholesterol; Hypothyroidism; OSA on CPAP; Pneumonia; Thyroid disease; and Type II diabetes mellitus ([place]).   reports that she quit smoking 3 days ago. Her smoking use included Cigarettes. She has a 44.00 pack-year smoking history. She has never used smokeless tobacco.  Past Surgical History:  Procedure Laterality Date  . BACK SURGERY    . CARDIAC CATHETERIZATION    . COLONOSCOPY    . CYST EXCISION     removal of cyst in sinuses  . DILATION AND CURETTAGE OF UTERUS    . FOOT SURGERY Bilateral    bone removed from 4th and 5 th toes and put in pin then took pin out  . HERNIA REPAIR    . INCISION AND DRAINAGE  ~ 2014/2015   "removed mesh & infection"  . LACERATION REPAIR Right    repair wrist artery from laceration from ice skate  . LIPOMA EXCISION Right    fattty lipoma in neck  . NASAL SEPTUM SURGERY    . POSTERIOR LUMBAR FUSION  2015/2016 X 2   "added rods and screws"  . TUBAL LIGATION    . UMBILICAL HERNIA REPAIR  ~ 2013   w/mesh  . UPPER GI ENDOSCOPY  Allergies  Allergen Reactions  . Dilaudid [Hydromorphone Hcl] Other (See Comments)    "heavy sedation"  . Keflex [Cephalexin] Other (See Comments)    unspecified  . Prednisone Other (See Comments)    "counter reacts"  . Shellfish Allergy Other (See Comments)    Stomach cramps  . Tetracyclines & Related Other (See Comments)    unspecified     There is no immunization history on file for this patient.  Family History  Problem Relation Age of Onset  . Diabetes Mother   . Hypertension Mother   . Hypertension Father   . Cancer Father        lung     Current Outpatient Prescriptions:  .  atenolol (TENORMIN) 25 MG tablet, Take 25 mg by mouth daily., Disp: , Rfl:  .   atorvastatin (LIPITOR) 20 MG tablet, Take 20 mg by mouth daily., Disp: , Rfl:  .  baclofen (LIORESAL) 10 MG tablet, Take 10 mg by mouth at bedtime., Disp: , Rfl:  .  budesonide-formoterol (SYMBICORT) 160-4.5 MCG/ACT inhaler, Inhale 2 puffs into the lungs 2 (two) times daily., Disp: 1 Inhaler, Rfl: 12 .  clonazePAM (KLONOPIN) 0.5 MG tablet, Take 0.5 tablets (0.25 mg total) by mouth 2 (two) times daily., Disp: 45 tablet, Rfl: 0 .  levalbuterol (XOPENEX) 0.63 MG/3ML nebulizer solution, Take 3 mLs (0.63 mg total) by nebulization 4 (four) times daily., Disp: 60 mL, Rfl: 12 .  levothyroxine (SYNTHROID, LEVOTHROID) 25 MCG tablet, Take 25 mcg by mouth daily before breakfast., Disp: , Rfl:  .  metoprolol tartrate (LOPRESSOR) 25 MG tablet, Take 1 tablet (25 mg total) by mouth 2 (two) times daily., Disp: 60 tablet, Rfl: 3 .  montelukast (SINGULAIR) 10 MG tablet, Take 10 mg by mouth at bedtime., Disp: , Rfl:  .  naproxen (NAPROSYN) 500 MG tablet, Take 500 mg by mouth 3 (three) times daily as needed., Disp: , Rfl:  .  pantoprazole (PROTONIX) 40 MG tablet, Take 1 tablet (40 mg total) by mouth 2 (two) times daily before a meal., Disp: 60 tablet, Rfl: 3 .  pregabalin (LYRICA) 75 MG capsule, Take 1 capsule (75 mg total) by mouth 2 (two) times daily., Disp: 60 capsule, Rfl: 0 .  roflumilast (DALIRESP) 500 MCG TABS tablet, Take 1 tablet (500 mcg total) by mouth daily., Disp: 30 tablet, Rfl: 1 .  tiotropium (SPIRIVA) 18 MCG inhalation capsule, Place 1 capsule (18 mcg total) into inhaler and inhale daily., Disp: 30 capsule, Rfl: 12 .  tiZANidine (ZANAFLEX) 4 MG capsule, Take 4 mg by mouth at bedtime., Disp: , Rfl:  .  valACYclovir (VALTREX) 500 MG tablet, Take 500 mg by mouth daily. , Disp: , Rfl:  .  varenicline (CHANTIX PAK) 0.5 MG X 11 & 1 MG X 42 tablet, Take by mouth 2 (two) times daily. Take one 0.5 mg tablet by mouth once daily for 3 days, then increase to one 0.5 mg tablet twice daily for 4 days, then increase to  one 1 mg tablet twice daily., Disp: , Rfl:  .  venlafaxine XR (EFFEXOR-XR) 150 MG 24 hr capsule, Take 150 mg by mouth daily with breakfast., Disp: , Rfl:     Review of Systems     Objective:   Physical Exam  Constitutional: She is oriented to person, place, and time. She appears well-developed and well-nourished. No distress.  HENT:  Head: Normocephalic and atraumatic.  Right Ear: External ear normal.  Left Ear: External ear normal.  Mouth/Throat: Oropharynx is  clear and moist. No oropharyngeal exudate.  Rt neck scar of surgery + raspi voice  Eyes: Pupils are equal, round, and reactive to light. Conjunctivae and EOM are normal. Right eye exhibits no discharge. Left eye exhibits no discharge. No scleral icterus.  Neck: Normal range of motion. Neck supple. No JVD present. No tracheal deviation present. No thyromegaly present.  Cardiovascular: Normal rate, regular rhythm, normal heart sounds and intact distal pulses.  Exam reveals no gallop and no friction rub.   No murmur heard. Pulmonary/Chest: Effort normal and breath sounds normal. No respiratory distress. She has no wheezes. She has no rales. She exhibits no tenderness.  No wheeze Labored with upper airway noise while talking  Abdominal: Soft. Bowel sounds are normal. She exhibits no distension and no mass. There is no tenderness. There is no rebound and no guarding.  Musculoskeletal: Normal range of motion. She exhibits no edema or tenderness.  Lymphadenopathy:    She has no cervical adenopathy.  Neurological: She is alert and oriented to person, place, and time. She has normal reflexes. No cranial nerve deficit. She exhibits normal muscle tone. Coordination normal.  Skin: Skin is warm and dry. No rash noted. She is not diaphoretic. No erythema. No pallor.  Psychiatric: She has a normal mood and affect. Her behavior is normal. Judgment and thought content normal.  Vitals reviewed.  Vitals:   11/25/16 0957  BP: 138/84  Pulse:  67  SpO2: 95%  Weight: 208 lb 6.4 oz (94.5 kg)  Height: 5\' 7"  (1.702 m)    Estimated body mass index is 32.64 kg/m as calculated from the following:   Height as of this encounter: 5\' 7"  (1.702 m).   Weight as of this encounter: 208 lb 6.4 oz (94.5 kg).     Assessment:       ICD-10-CM   1. Stage 3 severe COPD by GOLD classification ([place]) J44.9   2. Dyspnea and respiratory abnormalities R06.00    R06.89   3. Stopped smoking with greater than 40 pack year history Z87.891        Plan:       Severe copd but currently wihtout flare up Significant symptoms + Glad you quit smoking  plan DO CT scan chest without contrast Do alpha 1 genetic blood test for copd Do ONO on room air while you take bipap for your sleep apnea Glad you quit smoking - remain quit Flu shot 11/25/2016 Continuie spiriva and symbicort scheduled  Followup - next few week with app but after completing abve - will discuss pulmonary rehab at followup - consider ENT eval at followup for raspy voice esp with history of smoking - - preop eval for incisional abdominal wall hernia based on results  - quitting smoking x 8 weeks and rehab can help you withstand surgery better   > 50% of this > 25 min visit spent in face to face counseling or coordination of care     Dr. Brand Males, M.D., [facility].C.P Pulmonary and Critical Care Medicine Staff Physician [place] [facility] Pager: [phone], If no answer or between  15:00h - 7:00h: call [phone]  11/25/2016 10:22 AM

## 2016-11-25 NOTE — Progress Notes (Signed)
  10/14/2016 Hospital Follow Up: Pt presents for follow up. She was admitted for COPD exacerbation 09/24/2016-09/30/2016. She was treated with BiPAP, oxygen therapy, scheduled bronchodilators, and IV steroids. She presents today for follow-up.   She states she has been doing well. She states the klonopin she is taking has helped with her anxiety, and vocal cord dysfunction, and therefore her shortness of breath. She continues to smoke. She started on Chantix today, with the goal of quitting. She states her breathing is horrible. She continues to smoke. She states she is compliant with her Symbicort twice daily , She is using her Xopenex nebulizers 3  times daily, instead of the 4 times daily but they have been prescribed. She was given a neb machine upon discharge from the hospital. She states she does not like this neb machine and she is going to see if her insurance company provide her with another one. She is compliant with her Spiriva, Daliresp, and Singulair.Her voice is hoarse, as she has VCD. She is coughing up white to yellow secretions. She states she has post nasal gtt.She states she does not have any swelling in her feet or legs.She denies fever, chest pain, orthopnea or hemoptysis. She did follow-up with her PCP. She is in no apparent distress in the office today. She states she is compliant with her CPAP machine nightl

## 2016-11-25 NOTE — Telephone Encounter (Signed)
Ok will address at [facility] 11/25/2016  Dr. Brand Males, M.D., [facility].C.P Pulmonary and Critical Care Medicine Staff Physician [place] [facility] Pager: [phone], If no answer or between  15:00h - 7:00h: call [phone]  11/25/2016 4:29 AM

## 2016-11-26 NOTE — Telephone Encounter (Signed)
Princess with [facility] is retuning phone call.

## 2016-11-26 NOTE — Telephone Encounter (Signed)
Princess returned phone call, ok to leave message on voicemail [phone])

## 2016-11-26 NOTE — Telephone Encounter (Signed)
Spoke with Joan [name]. She is aware of the patient not needing a 63mw. She wanted to know if she should continue therapy now or wait until after her OV with [name] at the end of this month.   MR, please advise. Thanks!

## 2016-11-26 NOTE — Telephone Encounter (Signed)
Does she have portable o2 to continue Rx?  If not, can she wait till ptatient sees [name] and we order portable o2 then?  If she cannot wait till end of month and needs o2 to do the PT then I Am afraid patient will have to come in 11/26/2016 or 11/27/16 for a qualifying walk (my apologies if I missed it yesterdya)  Dr. Brand Males, M.D., [facility].C.P Pulmonary and Critical Care Medicine Staff Physician [place] [facility] Pager: [phone], If no answer or between  15:00h - 7:00h: call [phone]  11/26/2016 9:09 AM

## 2016-11-26 NOTE — Telephone Encounter (Signed)
Left message for Joan [name] to call back.

## 2016-11-26 NOTE — Telephone Encounter (Signed)
lmtcb for Princess at [facility]

## 2016-11-26 NOTE — Telephone Encounter (Signed)
lmtcb for Joan [name]

## 2016-11-26 NOTE — Telephone Encounter (Signed)
She does not need 26mwd but when she regturn for followup later this month to see sarah groce (cpied)for fu of ONO and other results she can be walked for qualifying o2 sat  Dr. Brand Males, M.D., [facility].C.P Pulmonary and Critical Care Medicine Staff Physician [place] [facility] Pager: [phone], If no answer or between  15:00h - 7:00h: call [phone]  11/26/2016 8:09 AM

## 2016-11-27 DIAGNOSIS — M25561 Pain in right knee: Secondary | ICD-10-CM | POA: Diagnosis not present

## 2016-11-27 DIAGNOSIS — J9611 Chronic respiratory failure with hypoxia: Secondary | ICD-10-CM | POA: Diagnosis not present

## 2016-11-27 DIAGNOSIS — E119 Type 2 diabetes mellitus without complications: Secondary | ICD-10-CM | POA: Diagnosis not present

## 2016-11-27 DIAGNOSIS — R69 Illness, unspecified: Secondary | ICD-10-CM | POA: Diagnosis not present

## 2016-11-27 DIAGNOSIS — M1990 Unspecified osteoarthritis, unspecified site: Secondary | ICD-10-CM | POA: Diagnosis not present

## 2016-11-27 DIAGNOSIS — G4733 Obstructive sleep apnea (adult) (pediatric): Secondary | ICD-10-CM | POA: Diagnosis not present

## 2016-11-27 DIAGNOSIS — Z7951 Long term (current) use of inhaled steroids: Secondary | ICD-10-CM | POA: Diagnosis not present

## 2016-11-27 DIAGNOSIS — J441 Chronic obstructive pulmonary disease with (acute) exacerbation: Secondary | ICD-10-CM | POA: Diagnosis not present

## 2016-11-27 DIAGNOSIS — E039 Hypothyroidism, unspecified: Secondary | ICD-10-CM | POA: Diagnosis not present

## 2016-11-27 DIAGNOSIS — B1920 Unspecified viral hepatitis C without hepatic coma: Secondary | ICD-10-CM | POA: Diagnosis not present

## 2016-11-27 NOTE — Telephone Encounter (Signed)
Called and spoke with Joan [name] and she is aware that the pt did not need the 6 min walk.  She stated that when she has been working with the pt that she has dropped to 88% on room air about 2 mins into walking.  She was working with someone the other day and dropped to 85% at about 2 mons into walking.  Joan [name] will see the pt today and discuss about the next step that she would like to do:  Either wait until her next appt with SG and we can qualify her then or if she wants to come in for the walk before.  Joan [name] will call back with update.

## 2016-11-28 ENCOUNTER — Ambulatory Visit (INDEPENDENT_AMBULATORY_CARE_PROVIDER_SITE_OTHER): Payer: Medicare HMO | Admitting: Internal Medicine

## 2016-11-28 ENCOUNTER — Encounter: Payer: Self-pay | Admitting: Internal Medicine

## 2016-11-28 VITALS — BP 134/88 | HR 59 | Temp 97.9°F | Ht 67.0 in | Wt 208.0 lb

## 2016-11-28 DIAGNOSIS — Z720 Tobacco use: Secondary | ICD-10-CM | POA: Diagnosis not present

## 2016-11-28 DIAGNOSIS — E099 Drug or chemical induced diabetes mellitus without complications: Secondary | ICD-10-CM | POA: Diagnosis not present

## 2016-11-28 DIAGNOSIS — M549 Dorsalgia, unspecified: Secondary | ICD-10-CM

## 2016-11-28 DIAGNOSIS — F419 Anxiety disorder, unspecified: Secondary | ICD-10-CM

## 2016-11-28 DIAGNOSIS — J449 Chronic obstructive pulmonary disease, unspecified: Secondary | ICD-10-CM

## 2016-11-28 DIAGNOSIS — R69 Illness, unspecified: Secondary | ICD-10-CM | POA: Diagnosis not present

## 2016-11-28 DIAGNOSIS — M160 Bilateral primary osteoarthritis of hip: Secondary | ICD-10-CM | POA: Diagnosis not present

## 2016-11-28 DIAGNOSIS — I10 Essential (primary) hypertension: Secondary | ICD-10-CM | POA: Diagnosis not present

## 2016-11-28 DIAGNOSIS — T380X5A Adverse effect of glucocorticoids and synthetic analogues, initial encounter: Secondary | ICD-10-CM

## 2016-11-28 DIAGNOSIS — J441 Chronic obstructive pulmonary disease with (acute) exacerbation: Secondary | ICD-10-CM | POA: Diagnosis not present

## 2016-11-28 NOTE — Progress Notes (Signed)
Subjective:    Patient ID: Joan [name], female    DOB: Sep 21, 1960, 56 y.o.   MRN: [id number]  HPI The patient is a new 56 YO female coming in for evaluation of several medical problems including her steroid induced diabetes (she does require insulin with steroid treatment of her COPD, does not have long term treatment of sugars, diet is mediocre, no exercise, has been on insulin for several weeks at the longest with steroid therapy, denies numbness or weakness in her feet), and her anxiety (with feeling breathless which will trigger a breathing attack, she is taking clonopin 1/2-1 in the evening, previously BID but this was making her sleepy and not functional during the day, past history of cocaine and alcohol abuse, 5-6 years sober now), and her breathing (needs a 6 minute walking test which was not done at recent pulmonary visit, she does not have the extra $40 copay to return just for this test and wonders if we can do it, quit from smoking for about 1 week now, taking chantix to help, SOB with even small exertion and feels as though she cannot breathe, uses bipap at night time and is awaiting night time oximetry to see if she needs supplemental oxygen at night time) as well as her blood pressure (she is currently taking atenolol and metoprolol, denies low blood pressure or dizziness, denies lung spasm after taking her meds, been on atenolol alone for a long time), and her back pain (seeing orthopedics, they have her on muscle relaxers and she is taking baclofen and tizanidine currently as they gave her both, they do tend to make her some sleepy as well).   PMH, [facility], social history reviewed and updated.   Review of Systems  Constitutional: Positive for activity change and fatigue. Negative for appetite change, chills, fever and unexpected weight change.  HENT: Negative.   Eyes: Negative.   Respiratory: Positive for cough, shortness of breath and wheezing. Negative for chest tightness.     Cardiovascular: Negative for chest pain, palpitations and leg swelling.  Gastrointestinal: Negative for abdominal distention, abdominal pain, constipation, diarrhea, nausea and vomiting.  Musculoskeletal: Positive for back pain and gait problem. Negative for arthralgias, joint swelling, neck pain and neck stiffness.  Skin: Negative.   Neurological: Negative for dizziness, tremors, weakness, light-headedness and headaches.  Psychiatric/Behavioral: Negative.       Objective:   Physical Exam  Constitutional: She is oriented to person, place, and time. She appears well-developed and well-nourished.  Appears older than stated age  HENT:  Head: Normocephalic and atraumatic.  Right Ear: External ear normal.  Left Ear: External ear normal.  Eyes: EOM are normal.  Neck: Normal range of motion. No JVD present.  Cardiovascular: Normal rate and regular rhythm.   Pulmonary/Chest: Effort normal. No respiratory distress. She has wheezes. She has no rales.  Winded with the walk test, expiratory wheezing throughout which does not clear with cough  Abdominal: Soft. Bowel sounds are normal. She exhibits no distension. There is no tenderness. There is no rebound.  Musculoskeletal: She exhibits no edema.  Lymphadenopathy:    She has no cervical adenopathy.  Neurological: She is alert and oriented to person, place, and time. Coordination normal.  Skin: Skin is warm and dry.  Psychiatric: She has a normal mood and affect.   Vitals:   11/28/16 0803  BP: 134/88  Pulse: (!) 59  Temp: 97.9 F (36.6 C)  TempSrc: Oral  SpO2: 98%  Weight: 208 lb (94.3 kg)  Height: 5\' 7"  (1.702 m)      Assessment & Plan:

## 2016-11-28 NOTE — Patient Instructions (Addendum)
We will have you stop taking tizanidine. Continue baclofen.   We will have you stop metoprolol. Continue atenolol.   We will have you change the protonix to once a day. Do this for 1 week. If you do not have acid reflux you can stop taking it altogether.   We will send the results of the walking test to the lung doctor so that they can get you oxygen while walking. They will likely still want to do the night time test to see if you need it at night time.   Come back in about 1-2 months to see how you are doing.

## 2016-11-28 NOTE — Progress Notes (Signed)
SATURATION QUALIFICATIONS: (This note is used to comply with regulatory documentation for home oxygen)  Patient Saturations on Room Air at Rest = 98%  Patient Saturations on Room Air while Ambulating = 87%  Patient Saturations on 2 Liters of oxygen while Ambulating = 92%  Please briefly explain why patient needs home oxygen:

## 2016-11-29 DIAGNOSIS — G4733 Obstructive sleep apnea (adult) (pediatric): Secondary | ICD-10-CM | POA: Diagnosis not present

## 2016-11-29 DIAGNOSIS — M1990 Unspecified osteoarthritis, unspecified site: Secondary | ICD-10-CM | POA: Diagnosis not present

## 2016-11-29 DIAGNOSIS — Z7951 Long term (current) use of inhaled steroids: Secondary | ICD-10-CM | POA: Diagnosis not present

## 2016-11-29 DIAGNOSIS — I10 Essential (primary) hypertension: Secondary | ICD-10-CM | POA: Insufficient documentation

## 2016-11-29 DIAGNOSIS — J441 Chronic obstructive pulmonary disease with (acute) exacerbation: Secondary | ICD-10-CM | POA: Diagnosis not present

## 2016-11-29 DIAGNOSIS — E119 Type 2 diabetes mellitus without complications: Secondary | ICD-10-CM | POA: Diagnosis not present

## 2016-11-29 DIAGNOSIS — E039 Hypothyroidism, unspecified: Secondary | ICD-10-CM | POA: Diagnosis not present

## 2016-11-29 DIAGNOSIS — R69 Illness, unspecified: Secondary | ICD-10-CM | POA: Diagnosis not present

## 2016-11-29 DIAGNOSIS — M25561 Pain in right knee: Secondary | ICD-10-CM | POA: Diagnosis not present

## 2016-11-29 DIAGNOSIS — J9611 Chronic respiratory failure with hypoxia: Secondary | ICD-10-CM | POA: Diagnosis not present

## 2016-11-29 DIAGNOSIS — M549 Dorsalgia, unspecified: Secondary | ICD-10-CM | POA: Insufficient documentation

## 2016-11-29 DIAGNOSIS — B1920 Unspecified viral hepatitis C without hepatic coma: Secondary | ICD-10-CM | POA: Diagnosis not present

## 2016-11-29 LAB — ALPHA-1 ANTITRYPSIN PHENOTYPE: A-1 Antitrypsin, Ser: 142 mg/dL (ref 83–199)

## 2016-11-29 NOTE — Assessment & Plan Note (Signed)
She is congratulated on 6 day success and encouraged to stay quit. Reminded that she may get increase in coughing and breathing difficulty with quitting which will pass with time.

## 2016-11-29 NOTE — Assessment & Plan Note (Signed)
Will stop metoprolol and only have her on atenolol as she should not be on 2 beta blockers. If she needs change in therapy would change from beta blocker given her lung disease but she wishes to continue today and has been stable on atenolol for years.

## 2016-11-29 NOTE — Assessment & Plan Note (Signed)
Okay with klonopin 1 at night time but no dose increase. Can try buspar if she needs increase.

## 2016-11-29 NOTE — Assessment & Plan Note (Signed)
6 minute walk test done in the office with hypoxia to 87% which corrected to 97% with oxygen. She does qualify for oxygen as needed and will forward to pulmonary physician so they can order her oxygen. She is interested in a concentrator to go to the store. She is also interested in pulmonary rehab prior to surgery. She is reminded of the strong need to stay abstained from smoking.

## 2016-11-29 NOTE — Assessment & Plan Note (Signed)
She is watching sugars at home now and fairly normal. 100-120 fasting. Last HgA1c not in diabetic range but does require insulin therapy with steroids. She is off insulin now. Foot exam normal.

## 2016-11-29 NOTE — Assessment & Plan Note (Signed)
She should not be on 2 different muscle relaxers and will stop tizanidine and asked her to continue with only baclofen. She is at risk for polypharmacy at this time.

## 2016-12-02 DIAGNOSIS — M25561 Pain in right knee: Secondary | ICD-10-CM | POA: Diagnosis not present

## 2016-12-02 DIAGNOSIS — R69 Illness, unspecified: Secondary | ICD-10-CM | POA: Diagnosis not present

## 2016-12-02 DIAGNOSIS — G4733 Obstructive sleep apnea (adult) (pediatric): Secondary | ICD-10-CM | POA: Diagnosis not present

## 2016-12-02 DIAGNOSIS — B1920 Unspecified viral hepatitis C without hepatic coma: Secondary | ICD-10-CM | POA: Diagnosis not present

## 2016-12-02 DIAGNOSIS — Z7951 Long term (current) use of inhaled steroids: Secondary | ICD-10-CM | POA: Diagnosis not present

## 2016-12-02 DIAGNOSIS — E119 Type 2 diabetes mellitus without complications: Secondary | ICD-10-CM | POA: Diagnosis not present

## 2016-12-02 DIAGNOSIS — J9611 Chronic respiratory failure with hypoxia: Secondary | ICD-10-CM | POA: Diagnosis not present

## 2016-12-02 DIAGNOSIS — E039 Hypothyroidism, unspecified: Secondary | ICD-10-CM | POA: Diagnosis not present

## 2016-12-02 DIAGNOSIS — M1990 Unspecified osteoarthritis, unspecified site: Secondary | ICD-10-CM | POA: Diagnosis not present

## 2016-12-02 DIAGNOSIS — J441 Chronic obstructive pulmonary disease with (acute) exacerbation: Secondary | ICD-10-CM | POA: Diagnosis not present

## 2016-12-02 NOTE — Telephone Encounter (Signed)
Will close this message at this time.  Waiting for update from [facility] on this pt and the pt does have a pending appt with SG on 10/22

## 2016-12-03 ENCOUNTER — Ambulatory Visit (INDEPENDENT_AMBULATORY_CARE_PROVIDER_SITE_OTHER)
Admission: RE | Admit: 2016-12-03 | Discharge: 2016-12-03 | Disposition: A | Discharge status: Home / Self Care | Payer: Medicare HMO | Source: Ambulatory Visit | Attending: Internal Medicine | Admitting: Internal Medicine

## 2016-12-03 DIAGNOSIS — R911 Solitary pulmonary nodule: Secondary | ICD-10-CM | POA: Diagnosis not present

## 2016-12-03 DIAGNOSIS — R0602 Shortness of breath: Secondary | ICD-10-CM

## 2016-12-04 ENCOUNTER — Telehealth: Payer: Self-pay | Admitting: Internal Medicine

## 2016-12-04 DIAGNOSIS — E039 Hypothyroidism, unspecified: Secondary | ICD-10-CM | POA: Diagnosis not present

## 2016-12-04 DIAGNOSIS — B1920 Unspecified viral hepatitis C without hepatic coma: Secondary | ICD-10-CM | POA: Diagnosis not present

## 2016-12-04 DIAGNOSIS — J441 Chronic obstructive pulmonary disease with (acute) exacerbation: Secondary | ICD-10-CM | POA: Diagnosis not present

## 2016-12-04 DIAGNOSIS — E119 Type 2 diabetes mellitus without complications: Secondary | ICD-10-CM | POA: Diagnosis not present

## 2016-12-04 DIAGNOSIS — Z7951 Long term (current) use of inhaled steroids: Secondary | ICD-10-CM | POA: Diagnosis not present

## 2016-12-04 DIAGNOSIS — J9611 Chronic respiratory failure with hypoxia: Secondary | ICD-10-CM | POA: Diagnosis not present

## 2016-12-04 DIAGNOSIS — M1990 Unspecified osteoarthritis, unspecified site: Secondary | ICD-10-CM | POA: Diagnosis not present

## 2016-12-04 DIAGNOSIS — R69 Illness, unspecified: Secondary | ICD-10-CM | POA: Diagnosis not present

## 2016-12-04 DIAGNOSIS — G4733 Obstructive sleep apnea (adult) (pediatric): Secondary | ICD-10-CM | POA: Diagnosis not present

## 2016-12-04 DIAGNOSIS — M25561 Pain in right knee: Secondary | ICD-10-CM | POA: Diagnosis not present

## 2016-12-04 MED ORDER — ALBUTEROL SULFATE (2.5 MG/3ML) 0.083% IN NEBU: 2.5000 mg | INHALATION_SOLUTION | Freq: Four times a day (QID) | RESPIRATORY_TRACT | 12 refills | Status: DC | PRN

## 2016-12-04 NOTE — Telephone Encounter (Signed)
Joan [name] from [facility] called requesting verbal orders to be able to extend the patient's home health physical therapy twice a week for three weeks and once a week for two weeks.

## 2016-12-04 NOTE — Telephone Encounter (Signed)
Called pt and told her we could discontinue the xopenex and have her try the albuterol neb solution since it was cheaper for her. Pt expressed understanding and said she would give it a try to see how it would make her feel. Told pt that her neb solution and rescue inhaler were both prn meds. Pt stated to me that she has been only using them as prn.  Nothing further needed.

## 2016-12-04 NOTE — Telephone Encounter (Signed)
In the whole wide world there are only 2 choices - albuterol and xopenex with former being cheaper but can make people more tachy and xopenex being more expensive but less tachy. And this is a difficult choice.  My rec for her  1. Dc xopenex 2. Do albuterol neb as primary with mdi as secondary - but both are only t prn and do not use it combined more than 1-4 times per day as needed  Dr. Brand Males, M.D., [facility].C.P Pulmonary and Critical Care Medicine Staff Physician [place] [facility] Pager: [phone], If no answer or between  15:00h - 7:00h: call [phone]  12/04/2016 12:44 PM

## 2016-12-04 NOTE — Telephone Encounter (Signed)
When pt returned call so I could relay the result of her labwork, she stated to me that she was wondering if we could switch her to a different neb solution other than the xopenex. Pt states cost is too high for her insurance and wants to know if there was a cheaper alternative to the xopenex that could be recommended.   Pt stated to me that she was on albuterol rescue inhaler and had albuterol for neb machine but being on both made her start to become tachy and was switched to the xopenex.  MR, could you advise on a different neb solution we could switch pt to. Pt asked if we could possibly change her rescue inhaler and put her back on albuterol neb sol.   If you have any other suggestions, please advise. Thanks!

## 2016-12-04 NOTE — Telephone Encounter (Signed)
Spoke with pt who stated to me that she had a 6-min walk with her PCP and that Princess the PT stated that they were waiting on word for pt to be started on O2. I told pt that we were waiting on Princess to contact us back to give [place] an update but pt kept saying that [place] told OT who came to pt's house that pt was not on O2 yet and was waiting on being started on O2.   Pt stated to me that she didn't think she could make it to her OV on 12/16/16 with SG without oxygen.  Pt has not had the overnight pulse oximetry done yet from when order was placed at pt's last OV 11/25/16. Will follow up on this.

## 2016-12-04 NOTE — Telephone Encounter (Signed)
pregabalin (LYRICA) 75 MG capsule   varenicline (CHANTIX PAK) 0.5 MG X 11 & 1 MG X 42 tablet   [id number] - [place], [place] - [address] AT [place] [phone] (Phone) [phone] (Fax)   Patient is requesting a refill on this medication.

## 2016-12-05 MED ORDER — VARENICLINE TARTRATE 1 MG PO TABS: 1.0000 mg | ORAL_TABLET | Freq: Two times a day (BID) | ORAL | 2 refills | Status: DC

## 2016-12-05 MED ORDER — PREGABALIN 75 MG PO CAPS: 75.0000 mg | ORAL_CAPSULE | Freq: Two times a day (BID) | ORAL | 2 refills | Status: DC

## 2016-12-05 NOTE — Telephone Encounter (Signed)
fine

## 2016-12-05 NOTE — Telephone Encounter (Signed)
Chantix started month should not be renewed but sent in the continuing month dose. Please fax lyrica.

## 2016-12-05 NOTE — Telephone Encounter (Signed)
lyrica faxed

## 2016-12-05 NOTE — Telephone Encounter (Signed)
Called Kendra back no answer LMOM w/MD response...Joan [name]

## 2016-12-09 DIAGNOSIS — M160 Bilateral primary osteoarthritis of hip: Secondary | ICD-10-CM | POA: Diagnosis not present

## 2016-12-11 ENCOUNTER — Encounter: Payer: Self-pay | Admitting: Internal Medicine

## 2016-12-11 DIAGNOSIS — J449 Chronic obstructive pulmonary disease, unspecified: Secondary | ICD-10-CM | POA: Diagnosis not present

## 2016-12-11 DIAGNOSIS — R0902 Hypoxemia: Secondary | ICD-10-CM | POA: Diagnosis not present

## 2016-12-13 DIAGNOSIS — M25561 Pain in right knee: Secondary | ICD-10-CM | POA: Diagnosis not present

## 2016-12-13 DIAGNOSIS — G4733 Obstructive sleep apnea (adult) (pediatric): Secondary | ICD-10-CM | POA: Diagnosis not present

## 2016-12-13 DIAGNOSIS — J441 Chronic obstructive pulmonary disease with (acute) exacerbation: Secondary | ICD-10-CM | POA: Diagnosis not present

## 2016-12-13 DIAGNOSIS — M1990 Unspecified osteoarthritis, unspecified site: Secondary | ICD-10-CM | POA: Diagnosis not present

## 2016-12-13 DIAGNOSIS — E039 Hypothyroidism, unspecified: Secondary | ICD-10-CM | POA: Diagnosis not present

## 2016-12-13 DIAGNOSIS — E119 Type 2 diabetes mellitus without complications: Secondary | ICD-10-CM | POA: Diagnosis not present

## 2016-12-13 DIAGNOSIS — J9611 Chronic respiratory failure with hypoxia: Secondary | ICD-10-CM | POA: Diagnosis not present

## 2016-12-13 DIAGNOSIS — M25551 Pain in right hip: Secondary | ICD-10-CM | POA: Diagnosis not present

## 2016-12-13 DIAGNOSIS — B1920 Unspecified viral hepatitis C without hepatic coma: Secondary | ICD-10-CM | POA: Diagnosis not present

## 2016-12-13 DIAGNOSIS — R69 Illness, unspecified: Secondary | ICD-10-CM | POA: Diagnosis not present

## 2016-12-16 ENCOUNTER — Ambulatory Visit (INDEPENDENT_AMBULATORY_CARE_PROVIDER_SITE_OTHER): Payer: Medicare HMO | Admitting: Acute Care

## 2016-12-16 ENCOUNTER — Encounter: Payer: Self-pay | Admitting: Acute Care

## 2016-12-16 ENCOUNTER — Telehealth: Payer: Self-pay | Admitting: Internal Medicine

## 2016-12-16 VITALS — BP 136/76 | HR 70 | Ht 67.0 in | Wt 209.0 lb

## 2016-12-16 DIAGNOSIS — J449 Chronic obstructive pulmonary disease, unspecified: Secondary | ICD-10-CM | POA: Diagnosis not present

## 2016-12-16 DIAGNOSIS — G4733 Obstructive sleep apnea (adult) (pediatric): Secondary | ICD-10-CM | POA: Diagnosis not present

## 2016-12-16 DIAGNOSIS — M1990 Unspecified osteoarthritis, unspecified site: Secondary | ICD-10-CM | POA: Diagnosis not present

## 2016-12-16 DIAGNOSIS — Z01818 Encounter for other preprocedural examination: Secondary | ICD-10-CM | POA: Diagnosis not present

## 2016-12-16 DIAGNOSIS — J441 Chronic obstructive pulmonary disease with (acute) exacerbation: Secondary | ICD-10-CM | POA: Diagnosis not present

## 2016-12-16 DIAGNOSIS — E039 Hypothyroidism, unspecified: Secondary | ICD-10-CM | POA: Diagnosis not present

## 2016-12-16 DIAGNOSIS — B1920 Unspecified viral hepatitis C without hepatic coma: Secondary | ICD-10-CM | POA: Diagnosis not present

## 2016-12-16 DIAGNOSIS — E119 Type 2 diabetes mellitus without complications: Secondary | ICD-10-CM | POA: Diagnosis not present

## 2016-12-16 DIAGNOSIS — M25561 Pain in right knee: Secondary | ICD-10-CM | POA: Diagnosis not present

## 2016-12-16 DIAGNOSIS — R69 Illness, unspecified: Secondary | ICD-10-CM | POA: Diagnosis not present

## 2016-12-16 DIAGNOSIS — J9611 Chronic respiratory failure with hypoxia: Secondary | ICD-10-CM | POA: Diagnosis not present

## 2016-12-16 DIAGNOSIS — M25551 Pain in right hip: Secondary | ICD-10-CM | POA: Diagnosis not present

## 2016-12-16 MED ORDER — VENLAFAXINE HCL ER 150 MG PO CP24: 150.0000 mg | ORAL_CAPSULE | Freq: Every day | ORAL | 2 refills | Status: DC

## 2016-12-16 NOTE — Patient Instructions (Addendum)
It is good to see you today. You are at considerable risk for pulmonary complications with surgery due tto your asthma and COPD.. These include  pneumonia; b) recurrent intubation risk; c) prolonged or recurrent acute respiratory failure needing mechanical ventilation; d) prolonged hospitalization; e) DVT/Pulmonary embolism; f) Acute Pulmonary edema. We will make recommendations to promote optimal pulmonary outcome for you. Continue using your Symbicort, Singulair,Daliresp, and Spiriva as you have been doing. Rinse mouth after using inhalers. You will need a hospital follow up with Pulmonary after surgery to ensure you are doing well  Continue on CPAP at bedtime. You appear to be benefiting from the treatment Goal is to wear for at least 6 hours each night for maximal clinical benefit. Continue to work on weight loss, as the link between excess weight  and sleep apnea is well established.  Do not drive if sleepy. Remember to clean mask, tubing, filter, and reservoir once weekly with soapy water.  Please contact office for sooner follow up if symptoms do not improve or worsen or seek emergency care

## 2016-12-16 NOTE — Assessment & Plan Note (Signed)
Needs home oxygen for desaturations with ambulation Plan: We will order home oxygen. POC of choice Oxygen at 2 L [place] with exercise Oxygen saturation goals are 88-92% Follow up with Dr. Elsworth Soho as is already scheduled 01/13/2017 Please contact office for sooner follow up if symptoms do not improve or worsen or seek emergency care

## 2016-12-16 NOTE — Assessment & Plan Note (Signed)
Pt. Requires clearance for hip replacement 11/2016 She is at increased risk of pulmonary complications 2/2 COPD stage 3 and asthma Plan: Recommendations for optimal pulmonary outcome:  You are at considerable risk for pulmonary complications with surgery due tto your asthma and COPD.. These include  pneumonia; b) recurrent intubation risk; c) prolonged or recurrent acute respiratory failure needing mechanical ventilation; d) prolonged hospitalization; e) DVT/Pulmonary embolism; f) Acute Pulmonary edema. We will make recommendations to promote optimal pulmonary outcome for you.  Recommend 1. Short duration of surgery as much as possible and avoid paralytic if possible 2. Recovery in step down or ICU with Pulmonary consultation 3. DVT prophylaxis 4. Aggressive pulmonary toilet with o2, bronchodilatation, and incentive spirometry and early ambulation 5. Pt must wear CPAP QHS while in hospital 6. Oxygen saturation goals are 88-92% ( COPD) 7. Hospital Follow up [place] with Pulmonary. Prior to surgery: Continue using your Symbicort, Singulair,Daliresp, and Spiriva as you have been doing. Rinse mouth after using inhalers. You will need a hospital follow up with Pulmonary after surgery to ensure you are doing well  Continue on CPAP at bedtime. You appear to be benefiting from the treatment Goal is to wear for at least 6 hours each night for maximal clinical benefit. Continue to work on weight loss, as the link between excess weight  and sleep apnea is well established.  Do not drive if sleepy. Remember to clean mask, tubing, filter, and reservoir once weekly with soapy water.  Please contact office for sooner follow up if symptoms do not improve or worsen or seek emergency care

## 2016-12-16 NOTE — Telephone Encounter (Signed)
Pt called in for a refill on her  venlafaxine XR (EFFEXOR-XR) 150 MG 24 hr capsule [[id number]]   [facility]

## 2016-12-16 NOTE — Telephone Encounter (Signed)
MD sent to wrong pharmacy resent rx to [facility]...Johny Chess

## 2016-12-16 NOTE — Telephone Encounter (Signed)
Med has not been refilled by MD pls advise if ok to refill...Joan [name]

## 2016-12-16 NOTE — Progress Notes (Signed)
History of Present Illness Joan [name] is a 56 y.o. female former smoker with Stage 3 severe COPD and asthma.She has been a patient of Dr. Chase Caller since 07/2016.  Past Medical History :  Anxiety; Arthritis; Asthma; Bipolar disorder (Joan [name]); Chronic bronchitis (Joan [name]); Chronic leg pain; Chronic lower back pain; COPD (chronic obstructive pulmonary disease) (Joan [name]); Depression; Emphysema of lung (Joan [name]); Hepatitis C; High cholesterol; Hypothyroidism; OSA on CPAP; Pneumonia; Thyroid disease; and Type II diabetes mellitus (Joan [name]).  12/16/2016 Pt. Presents for surgical clearance for  Hip Replacement Surgery.  The procedure is not scheduled as she is awaiting surgical clearance from pulmonary. She states that the surgeon is Dr. Gladstone Lighter.   Last COPD exacerbation was Sept. 2018, which required hospitalization.She states that she had a 6 minute walk test by her PCP 11/27/2016  and the results meet requirements for home oxygen with ambulation.( Drop to 87%). She states she is compliant with her medications.  [place] Postperative Pulmonary Risk Score Comment Score  Type of surgery - abd ao aneurysm (27), thoracic (21), neurosurgery / upper abdominal / vascular (21), neck (11)    Emergency Surgery - (11)  0  ALbumin < 3 or poor nutritional state - (9)  ?  BUN > 30 -  (8)  8  Partial or completely dependent functional status - (7)    COPD -  (6)  6  Age - 60 to 31 (4), > 70  (6)    TOTAL    Risk Stratifcation scores  - < 10, 11-19, 20-27, 28-40, >40  14   Moderate Risk for Pulmonary complications with surgery. Please follow the recommendations below.  Risk ameliorating factors are COPD, Asthma,need for home oxygen with exertion.   Major Pulmonary risks identified in the multifactorial risk analysis are but not limited to a) pneumonia; b) recurrent intubation risk; c) prolonged or recurrent acute respiratory failure needing mechanical ventilation; d) prolonged hospitalization; e) DVT/Pulmonary embolism; f)  Acute Pulmonary edema  Recommend 1. Short duration of surgery as much as possible and avoid paralytic if possible 2. Recovery in step down or ICU with Pulmonary consultation 3. DVT prophylaxis 4. Aggressive pulmonary toilet with o2, bronchodilatation, and incentive spirometry and early ambulation 5. Pt must wear CPAP QHS while in hospital 6. Oxygen saturation goals are 88-92% ( COPD) 7. Hospital Follow up [place] with Pulmonary.    Test Results:6 minute walk 11/30/2016  Patient Saturations on Room Air at Rest = 98%  Patient Saturations on Room Air while Ambulating = 87%  Patient Saturations on 2 Liters of oxygen while Ambulating = 92%  Results for Joan [name] (MRN [id number]) as of 11/25/2016 10:09  Ref. Range 10/30/2016 08:47  FEV1-Post Latest Units: L 1.23  FEV1-%Pred-Post Latest Units: % 42  FEV1-%Change-Post Latest Units: % 16  Post FEV1/FVC ratio Latest Units: % 49   Results for Joan [name] (MRN [id number]) as of 11/25/2016 10:09  Ref. Range 10/30/2016 08:47  DLCO cor Latest Units: ml/min/mmHg 9.52  DLCO cor % pred Latest Units: % 35      CBC Latest Ref Rng & Units 09/28/2016 09/27/2016 09/26/2016  WBC 4.0 - 10.5 K/uL 19.5(H) 23.5(H) 24.8(H)  Hemoglobin 12.0 - 15.0 g/dL 14.2 14.1 13.9  Hematocrit 36.0 - 46.0 % 44.6 42.1 41.8  Platelets 150 - 400 K/uL 316 284 279    BMP Latest Ref Rng & Units 09/28/2016 09/27/2016 09/26/2016  Glucose 65 - 99 mg/dL 202(H) 217(H) 292(H)  BUN 6 - 20 mg/dL 33(H) 29(H) 22(H)  Creatinine  0.44 - 1.00 mg/dL 1.02(H) 0.91 0.97  Sodium 135 - 145 mmol/L 139 140 140  Potassium 3.5 - 5.1 mmol/L 4.5 4.6 4.4  Chloride 101 - 111 mmol/L 103 104 105  CO2 22 - 32 mmol/L 26 27 24   Calcium 8.9 - 10.3 mg/dL 9.0 9.2 9.0    PFT    Component Value Date/Time   FEV1PRE 1.05 10/30/2016 0847   FEV1POST 1.23 10/30/2016 0847   FVCPRE 2.16 10/30/2016 0847   FVCPOST 2.53 10/30/2016 0847   TLC 5.36 10/30/2016 0847   DLCOUNC 9.40 10/30/2016 0847   PREFEV1FVCRT 49  10/30/2016 0847   PSTFEV1FVCRT 49 10/30/2016 0847    Ct Chest Wo Contrast  Result Date: 12/03/2016 CLINICAL DATA:  Worsening dyspnea for several months. Chronic productive cough. COPD. EXAM: CT CHEST WITHOUT CONTRAST TECHNIQUE: Multidetector CT imaging of the chest was performed following the standard protocol without IV contrast. COMPARISON:  09/27/2016 chest radiograph. FINDINGS: Cardiovascular: Normal heart size. No significant pericardial fluid/thickening. Left anterior descending and left circumflex coronary atherosclerosis. Atherosclerotic nonaneurysmal thoracic aorta. Normal caliber pulmonary arteries. Mediastinum/Nodes: No discrete thyroid nodules. Unremarkable esophagus. No pathologically enlarged axillary, mediastinal or gross hilar lymph nodes, noting limited sensitivity for the detection of hilar adenopathy on this noncontrast study. Lungs/Pleura: No pneumothorax. No pleural effusion. Moderate to severe centrilobular and paraseptal emphysema with mild diffuse bronchial wall thickening. Bandlike opacities in the posterior lingula and anterior left lower lobe with associated volume loss and mild distortion, compatible with postinfectious/postinflammatory scarring. No acute consolidative airspace disease or lung masses. Two tiny solid pulmonary nodules in the upper lobes, largest 3 mm in the anterior right upper lobe (series 3/ image 34). No significant regions of subpleural reticulation, ground-glass attenuation, traction bronchiectasis, architectural distortion or frank honeycombing. Upper abdomen: Unremarkable. Musculoskeletal: No aggressive appearing focal osseous lesions. Moderate thoracic spondylosis. IMPRESSION: 1. No evidence of interstitial lung disease. No acute consolidative airspace disease. 2. Moderate to severe centrilobular and paraseptal emphysema with mild diffuse bronchial wall thickening, compatible with the provided history of COPD. 3. Two tiny solid pulmonary nodules, largest 3  mm. No follow-up needed if patient is low-risk (and has no known or suspected primary neoplasm). Non-contrast chest CT can be considered in 12 months if patient is high-risk. This recommendation follows the consensus statement: Guidelines for Management of Incidental Pulmonary Nodules Detected on CT Images: From the Fleischner Society 2017; Radiology 2017; 284:228-243. 4. Two-vessel coronary atherosclerosis. Aortic Atherosclerosis (ICD10-I70.0) and Emphysema (ICD10-J43.9). Electronically Signed   By: Ilona Sorrel M.D.   On: 12/03/2016 10:30   Ct Abdomen Pelvis W Contrast  Result Date: 11/21/2016 CLINICAL DATA:  Possible recurrent ventral hernia after 2 prior hernia repairs and mesh removal. EXAM: CT ABDOMEN AND PELVIS WITH CONTRAST TECHNIQUE: Multidetector CT imaging of the abdomen and pelvis was performed using the standard protocol following bolus administration of intravenous contrast. CONTRAST:  180mL ISOVUE-300 IOPAMIDOL INJECTION 61% IV. Oral contrast was also administered. COMPARISON:  None. FINDINGS: Lower chest: Emphysematous changes involving the visualized lung bases. Scarring in the lingula. Lung bases otherwise clear. Upper normal heart size. Left circumflex coronary artery calcification. Mitral annular calcification. Hepatobiliary: Liver normal in size and appearance. Gallbladder normal in appearance without calcified gallstones. No biliary ductal dilation. Pancreas: Normal in appearance without evidence of mass, ductal dilation, or inflammation. Spleen: Calcified granulomas in the spleen. No significant focal splenic parenchymal abnormalities. Normal in size. Adrenals/Urinary Tract: Normal appearing adrenal glands. Kidneys normal in size and appearance without focal parenchymal abnormality. No evidence of urinary  tract calculi or obstruction. Normal-appearing urinary bladder. Stomach/Bowel: Stomach normal in appearance for the degree of distention. Normal-appearing small bowel. Large stool burden  throughout the normal appearing colon. Distal descending colon, sigmoid colon and rectum decompressed. Normal decompressed appendix in the right upper pelvis and right mid abdomen. Vascular/Lymphatic: Severe distal abdominal aortic atherosclerosis without evidence of aneurysm. Mild bilateral common iliac artery atherosclerosis. Normal-appearing portal venous and systemic venous systems. No pathologic lymphadenopathy. Reproductive: Normal-appearing uterus and ovaries without evidence of adnexal mass. Other: Midline anterior abdominal wall/umbilical hernia containing normal appearing transverse colon. The defect measures approximately 4 cm. Musculoskeletal: Prior L5-S1 fusion and posterior decompression. Degenerative changes involving the lower thoracic and lumbar spine. No acute abnormalities. IMPRESSION: 1. Midline abdominal wall/umbilical hernia with the defect measuring approximately 4 cm. Normal-appearing transverse colon is present within the hernia. 2. Severe distal abdominal aortic atherosclerosis for patient age without evidence of aneurysm. Aortic Atherosclerosis (ICD10-I70.0) and Emphysema (ICD10-J43.9). Electronically Signed   By: Evangeline Dakin M.D.   On: 11/21/2016 14:55     Past medical hx Past Medical History:  Diagnosis Date  . Alcoholism and drug addiction in family   . Anxiety   . Arthritis    "knees, hips, lower back" (09/25/2016)  . Asthma   . Bipolar disorder ([place])   . Chronic bronchitis ([place])    "all the time" (09/25/2016)  . Chronic leg pain    RLE  . Chronic lower back pain   . COPD (chronic obstructive pulmonary disease) ([place])   . Depression   . Emphysema of lung ([place])   . Hepatitis C    "treated back in 2001-2002"  . High cholesterol   . Hypertension   . Hypothyroidism   . OSA on CPAP   . Pneumonia    "all the time" (09/25/2016)  . Thyroid disease   . Type II diabetes mellitus ([place])      Social History  Substance Use Topics  . Smoking status: Former Smoker     Packs/day: 1.00    Years: 44.00    Types: Cigarettes    Quit date: 11/22/2016  . Smokeless tobacco: Never Used     Comment: pt quit smoking 11/22/16 and currently taking chantix  ep  . Alcohol use Yes     Comment: 09/25/2016 "stopped a long time ago"    Ms.Elsayed reports that she quit smoking about 3 weeks ago. Her smoking use included Cigarettes. She has a 44.00 pack-year smoking history. She has never used smokeless tobacco. She reports that she drinks alcohol. She reports that she uses drugs, including "Crack" cocaine.  Tobacco Cessation: Former smoker 44 pack year history, quit 11/22/2016  Past surgical hx, Family hx, Social hx all reviewed.  Current Outpatient Prescriptions on File Prior to Visit  Medication Sig  . albuterol (PROVENTIL) (2.5 MG/3ML) 0.083% nebulizer solution Take 3 mLs (2.5 mg total) by nebulization every 6 (six) hours as needed for wheezing or shortness of breath.  Marland Kitchen atenolol (TENORMIN) 25 MG tablet Take 25 mg by mouth daily.  Marland Kitchen atorvastatin (LIPITOR) 20 MG tablet Take 20 mg by mouth daily.  . baclofen (LIORESAL) 10 MG tablet Take 10 mg by mouth at bedtime.  . budesonide-formoterol (SYMBICORT) 160-4.5 MCG/ACT inhaler Inhale 2 puffs into the lungs 2 (two) times daily.  . clonazePAM (KLONOPIN) 0.5 MG tablet Take 0.5 tablets (0.25 mg total) by mouth 2 (two) times daily.  Marland Kitchen levothyroxine (SYNTHROID, LEVOTHROID) 25 MCG tablet Take 25 mcg by mouth daily before breakfast.  .  montelukast (SINGULAIR) 10 MG tablet Take 10 mg by mouth at bedtime.  . naproxen (NAPROSYN) 500 MG tablet Take 500 mg by mouth 3 (three) times daily as needed.  . pantoprazole (PROTONIX) 40 MG tablet Take 1 tablet (40 mg total) by mouth 2 (two) times daily before a meal.  . pregabalin (LYRICA) 75 MG capsule Take 1 capsule (75 mg total) by mouth 2 (two) times daily.  . roflumilast (DALIRESP) 500 MCG TABS tablet Take 1 tablet (500 mcg total) by mouth daily.  Marland Kitchen tiotropium (SPIRIVA) 18 MCG inhalation capsule  Place 1 capsule (18 mcg total) into inhaler and inhale daily.  . valACYclovir (VALTREX) 500 MG tablet Take 500 mg by mouth daily.   . varenicline (CHANTIX CONTINUING MONTH PAK) 1 MG tablet Take 1 tablet (1 mg total) by mouth 2 (two) times daily.  Marland Kitchen venlafaxine XR (EFFEXOR-XR) 150 MG 24 hr capsule Take 150 mg by mouth daily with breakfast.   No current facility-administered medications on file prior to visit.      Allergies  Allergen Reactions  . Dilaudid [Hydromorphone Hcl] Other (See Comments)    "heavy sedation"  . Keflex [Cephalexin] Other (See Comments)    unspecified  . Prednisone Other (See Comments)    "counter reacts"  . Shellfish Allergy Other (See Comments)    Stomach cramps  . Tetracyclines & Related Other (See Comments)    unspecified    Review Of Systems:  Constitutional:   No  weight loss, night sweats,  Fevers, chills, fatigue, or  lassitude.  HEENT:   No headaches,  Difficulty swallowing,  Tooth/dental problems, or  Sore throat,                No sneezing, itching, ear ache, nasal congestion, post nasal drip,   CV:  No chest pain,  Orthopnea, PND, swelling in lower extremities, anasarca, dizziness, palpitations, syncope.   GI  No heartburn, indigestion, abdominal pain, nausea, vomiting, diarrhea, change in bowel habits, loss of appetite, bloody stools.   Resp: + shortness of breath with exertion not  at rest.  No excess mucus, no productive cough,  No non-productive cough,  No coughing up of blood.  No change in color of mucus.  No wheezing.  No chest wall deformity  Skin: no rash or lesions.  GU: no dysuria, change in color of urine, no urgency or frequency.  No flank pain, no hematuria   MS:  No joint pain or swelling.  No decreased range of motion.  No back pain.  Psych:  No change in mood or affect. No depression or anxiety.  No memory loss.   Vital Signs BP 136/76 (BP Location: Right Arm, Cuff Size: Normal)   Pulse 70   Ht 5\' 7"  (1.702 m)   Wt 209  lb (94.8 kg)   SpO2 96%   BMI 32.73 kg/m    Physical Exam:  General- No distress,  A&Ox3, pleasant ENT: No sinus tenderness, TM clear, pale nasal mucosa, no oral exudate,no post nasal drip, no LAN Cardiac: S1, S2, regular rate and rhythm, no murmur Chest: No wheeze/ rales/ dullness; no accessory muscle use, no nasal flaring, no sternal retractions, diminished per bases Abd.: Soft Non-tender, obese Ext: No clubbing cyanosis, edema Neuro:  normal strength, cranial nerves intact Skin: No rashes, warm and dry Psych: normal mood and behavior   Assessment/Plan Chronic Hypoxic Respiratory Failure Needs home oxygen for desaturations with ambulation Plan: We will order home oxygen. POC of choice Oxygen at 2  L [place] with exercise Oxygen saturation goals are 88-92% Follow up with Dr. Elsworth Soho as is already scheduled 01/13/2017 Please contact office for sooner follow up if symptoms do not improve or worsen or seek emergency care    Pre-Op Clearance for hip replacement Pt. Requires clearance for hip replacement 11/2016 She is at increased risk of pulmonary complications 2/2 COPD stage 3 and asthma Plan: Recommendations for optimal pulmonary outcome:  You are at considerable risk for pulmonary complications with surgery due tto your asthma and COPD.. These include  pneumonia; b) recurrent intubation risk; c) prolonged or recurrent acute respiratory failure needing mechanical ventilation; d) prolonged hospitalization; e) DVT/Pulmonary embolism; f) Acute Pulmonary edema. We will make recommendations to promote optimal pulmonary outcome for you.  Recommend 1. Short duration of surgery as much as possible and avoid paralytic if possible 2. Recovery in step down or ICU with Pulmonary consultation 3. DVT prophylaxis 4. Aggressive pulmonary toilet with o2, bronchodilatation, and incentive spirometry and early ambulation 5. Pt must wear CPAP QHS while in hospital 6. Oxygen saturation goals are  88-92% ( COPD) 7. Hospital Follow up [place] with Pulmonary. Prior to surgery: Continue using your Symbicort, Singulair,Daliresp, and Spiriva as you have been doing. Rinse mouth after using inhalers. You will need a hospital follow up with Pulmonary after surgery to ensure you are doing well  Continue on CPAP at bedtime. You appear to be benefiting from the treatment Goal is to wear for at least 6 hours each night for maximal clinical benefit. Continue to work on weight loss, as the link between excess weight  and sleep apnea is well established.  Do not drive if sleepy. Remember to clean mask, tubing, filter, and reservoir once weekly with soapy water.  Please contact office for sooner follow up if symptoms do not improve or worsen or seek emergency care       [name], NP 12/16/2016  10:41 AM

## 2016-12-17 ENCOUNTER — Telehealth: Payer: Self-pay | Admitting: Internal Medicine

## 2016-12-17 ENCOUNTER — Other Ambulatory Visit: Payer: Self-pay | Admitting: Internal Medicine

## 2016-12-17 DIAGNOSIS — J449 Chronic obstructive pulmonary disease, unspecified: Secondary | ICD-10-CM | POA: Diagnosis not present

## 2016-12-17 DIAGNOSIS — J9611 Chronic respiratory failure with hypoxia: Secondary | ICD-10-CM | POA: Diagnosis not present

## 2016-12-17 DIAGNOSIS — J441 Chronic obstructive pulmonary disease with (acute) exacerbation: Secondary | ICD-10-CM | POA: Diagnosis not present

## 2016-12-17 MED ORDER — ATORVASTATIN CALCIUM 20 MG PO TABS: 20.0000 mg | ORAL_TABLET | Freq: Every day | ORAL | 2 refills | Status: DC

## 2016-12-17 MED ORDER — LEVOTHYROXINE SODIUM 25 MCG PO TABS: 25.0000 ug | ORAL_TABLET | Freq: Every day | ORAL | 2 refills | Status: DC

## 2016-12-17 MED ORDER — PANTOPRAZOLE SODIUM 40 MG PO TBEC: 40.0000 mg | DELAYED_RELEASE_TABLET | Freq: Two times a day (BID) | ORAL | 2 refills | Status: DC

## 2016-12-17 MED ORDER — ATENOLOL 25 MG PO TABS: 25.0000 mg | ORAL_TABLET | Freq: Every day | ORAL | 2 refills | Status: DC

## 2016-12-17 MED ORDER — VENLAFAXINE HCL ER 150 MG PO CP24: 150.0000 mg | ORAL_CAPSULE | Freq: Every day | ORAL | 0 refills | Status: DC

## 2016-12-17 NOTE — Telephone Encounter (Signed)
Ok to refill Klonopin? Please advise SG

## 2016-12-17 NOTE — Telephone Encounter (Signed)
Order placed for POC, nothing further is needed.

## 2016-12-17 NOTE — Telephone Encounter (Signed)
MR pt states this medication is for her lungs and would like to know if you could Rx the clonazepam. Please advise.

## 2016-12-17 NOTE — Telephone Encounter (Signed)
Pt called and states she needs all of her meds sent to [facility] and needs refill Needs 90 day supply on all of them   She would also like a one month supply sent to [facility] on [address] while she waits for er Effexor in the mail.  Also would like a one month supply of he chantix  Please advise

## 2016-12-17 NOTE — Telephone Encounter (Signed)
I am not managing her Klonopin. I renewed them one time and explained to her that this would be a one-time refill from  me for this prescription. Please have patient follow-up with physician who wrote original prescription, or her PCP. Thanks so much.

## 2016-12-17 NOTE — Addendum Note (Signed)
Addended by: Earnstine Regal on: 12/17/2016 01:40 PM   Modules accepted: Orders

## 2016-12-17 NOTE — Telephone Encounter (Signed)
Spoke with pt she states she wants to get a small POC. SG can we order for pt? She states this was supposed to be ordered when she saw SG in the office.

## 2016-12-17 NOTE — Telephone Encounter (Signed)
Per chart MD sent chantix on 10/11 sent all maintenance med to [facility].Marland KitchenJohny [name]

## 2016-12-17 NOTE — Telephone Encounter (Signed)
Please place order for small PSC. Thank you

## 2016-12-19 DIAGNOSIS — M1990 Unspecified osteoarthritis, unspecified site: Secondary | ICD-10-CM | POA: Diagnosis not present

## 2016-12-19 DIAGNOSIS — M25551 Pain in right hip: Secondary | ICD-10-CM | POA: Diagnosis not present

## 2016-12-19 DIAGNOSIS — G4733 Obstructive sleep apnea (adult) (pediatric): Secondary | ICD-10-CM | POA: Diagnosis not present

## 2016-12-19 DIAGNOSIS — M25561 Pain in right knee: Secondary | ICD-10-CM | POA: Diagnosis not present

## 2016-12-19 DIAGNOSIS — Z87891 Personal history of nicotine dependence: Secondary | ICD-10-CM | POA: Diagnosis not present

## 2016-12-19 DIAGNOSIS — J9611 Chronic respiratory failure with hypoxia: Secondary | ICD-10-CM | POA: Diagnosis not present

## 2016-12-19 DIAGNOSIS — F419 Anxiety disorder, unspecified: Secondary | ICD-10-CM | POA: Diagnosis not present

## 2016-12-19 DIAGNOSIS — B1920 Unspecified viral hepatitis C without hepatic coma: Secondary | ICD-10-CM | POA: Diagnosis not present

## 2016-12-19 DIAGNOSIS — J441 Chronic obstructive pulmonary disease with (acute) exacerbation: Secondary | ICD-10-CM | POA: Diagnosis not present

## 2016-12-19 DIAGNOSIS — E119 Type 2 diabetes mellitus without complications: Secondary | ICD-10-CM | POA: Diagnosis not present

## 2016-12-19 DIAGNOSIS — R69 Illness, unspecified: Secondary | ICD-10-CM | POA: Diagnosis not present

## 2016-12-19 DIAGNOSIS — E039 Hypothyroidism, unspecified: Secondary | ICD-10-CM | POA: Diagnosis not present

## 2016-12-21 DIAGNOSIS — E119 Type 2 diabetes mellitus without complications: Secondary | ICD-10-CM | POA: Diagnosis not present

## 2016-12-23 ENCOUNTER — Telehealth: Payer: Self-pay | Admitting: Internal Medicine

## 2016-12-23 DIAGNOSIS — J9621 Acute and chronic respiratory failure with hypoxia: Secondary | ICD-10-CM

## 2016-12-23 DIAGNOSIS — J449 Chronic obstructive pulmonary disease, unspecified: Secondary | ICD-10-CM

## 2016-12-23 NOTE — Telephone Encounter (Signed)
ono 12/11/16 </= 88% for 1h 46 min - qualifies for 2L [place] at night if she is interested  Dr. Brand Males, M.D., [facility].C.P Pulmonary and Critical Care Medicine Staff Physician [place] [facility] Pager: [phone], If no answer or between  15:00h - 7:00h: call [phone]  12/23/2016 2:09 PM

## 2016-12-23 NOTE — Telephone Encounter (Signed)
MR please advise. Thanks! 

## 2016-12-24 NOTE — Telephone Encounter (Signed)
Tried to call pt but when thinking line connected, there was no answer. Will try to call back later.

## 2016-12-25 ENCOUNTER — Telehealth: Payer: Self-pay | Admitting: Internal Medicine

## 2016-12-25 DIAGNOSIS — M25551 Pain in right hip: Secondary | ICD-10-CM | POA: Diagnosis not present

## 2016-12-25 DIAGNOSIS — M1990 Unspecified osteoarthritis, unspecified site: Secondary | ICD-10-CM | POA: Diagnosis not present

## 2016-12-25 DIAGNOSIS — E039 Hypothyroidism, unspecified: Secondary | ICD-10-CM | POA: Diagnosis not present

## 2016-12-25 DIAGNOSIS — M25561 Pain in right knee: Secondary | ICD-10-CM | POA: Diagnosis not present

## 2016-12-25 DIAGNOSIS — J449 Chronic obstructive pulmonary disease, unspecified: Secondary | ICD-10-CM

## 2016-12-25 DIAGNOSIS — J441 Chronic obstructive pulmonary disease with (acute) exacerbation: Secondary | ICD-10-CM | POA: Diagnosis not present

## 2016-12-25 DIAGNOSIS — J9611 Chronic respiratory failure with hypoxia: Secondary | ICD-10-CM | POA: Diagnosis not present

## 2016-12-25 DIAGNOSIS — B1920 Unspecified viral hepatitis C without hepatic coma: Secondary | ICD-10-CM | POA: Diagnosis not present

## 2016-12-25 DIAGNOSIS — R69 Illness, unspecified: Secondary | ICD-10-CM | POA: Diagnosis not present

## 2016-12-25 DIAGNOSIS — G4733 Obstructive sleep apnea (adult) (pediatric): Secondary | ICD-10-CM | POA: Diagnosis not present

## 2016-12-25 DIAGNOSIS — E119 Type 2 diabetes mellitus without complications: Secondary | ICD-10-CM | POA: Diagnosis not present

## 2016-12-25 NOTE — Telephone Encounter (Addendum)
Spoke with pt, advised her that I would put another order in to specify to evaluate for SImply go mini. Order placed.   ono 12/11/16 </= 88% for 1h 46 min - qualifies for 2L [place] at night if she is interested  Dr. Brand Males, M.D., [facility].C.P Pulmonary and Critical Care Medicine Staff Physician [place] [facility] Pager: [phone], If no answer or between  15:00h - 7:00h: call [phone]

## 2016-12-25 NOTE — Telephone Encounter (Signed)
OV note clearing her for surgery refaxed to the number provided attn Joan [name] Pt aware and states nothing further needed

## 2016-12-25 NOTE — Telephone Encounter (Signed)
Sent to [facility]

## 2016-12-26 NOTE — Telephone Encounter (Signed)
MR please advise. Thanks! 

## 2016-12-27 DIAGNOSIS — M25551 Pain in right hip: Secondary | ICD-10-CM | POA: Diagnosis not present

## 2016-12-27 DIAGNOSIS — R69 Illness, unspecified: Secondary | ICD-10-CM | POA: Diagnosis not present

## 2016-12-27 DIAGNOSIS — B1920 Unspecified viral hepatitis C without hepatic coma: Secondary | ICD-10-CM | POA: Diagnosis not present

## 2016-12-27 DIAGNOSIS — G4733 Obstructive sleep apnea (adult) (pediatric): Secondary | ICD-10-CM | POA: Diagnosis not present

## 2016-12-27 DIAGNOSIS — E119 Type 2 diabetes mellitus without complications: Secondary | ICD-10-CM | POA: Diagnosis not present

## 2016-12-27 DIAGNOSIS — M25561 Pain in right knee: Secondary | ICD-10-CM | POA: Diagnosis not present

## 2016-12-27 DIAGNOSIS — J9611 Chronic respiratory failure with hypoxia: Secondary | ICD-10-CM | POA: Diagnosis not present

## 2016-12-27 DIAGNOSIS — J441 Chronic obstructive pulmonary disease with (acute) exacerbation: Secondary | ICD-10-CM | POA: Diagnosis not present

## 2016-12-27 DIAGNOSIS — M1990 Unspecified osteoarthritis, unspecified site: Secondary | ICD-10-CM | POA: Diagnosis not present

## 2016-12-27 DIAGNOSIS — E039 Hypothyroidism, unspecified: Secondary | ICD-10-CM | POA: Diagnosis not present

## 2016-12-27 NOTE — Telephone Encounter (Signed)
Pt is currently on O2 and does also want to have the O2 hooked up at night to use with her CPAP machine. Will place an order for this to be taken care of for pt. Nothing further needed.

## 2016-12-30 DIAGNOSIS — E119 Type 2 diabetes mellitus without complications: Secondary | ICD-10-CM | POA: Diagnosis not present

## 2016-12-30 DIAGNOSIS — B1920 Unspecified viral hepatitis C without hepatic coma: Secondary | ICD-10-CM | POA: Diagnosis not present

## 2016-12-30 DIAGNOSIS — J441 Chronic obstructive pulmonary disease with (acute) exacerbation: Secondary | ICD-10-CM | POA: Diagnosis not present

## 2016-12-30 DIAGNOSIS — J9611 Chronic respiratory failure with hypoxia: Secondary | ICD-10-CM | POA: Diagnosis not present

## 2016-12-30 DIAGNOSIS — R69 Illness, unspecified: Secondary | ICD-10-CM | POA: Diagnosis not present

## 2016-12-30 DIAGNOSIS — M25551 Pain in right hip: Secondary | ICD-10-CM | POA: Diagnosis not present

## 2016-12-30 DIAGNOSIS — M25561 Pain in right knee: Secondary | ICD-10-CM | POA: Diagnosis not present

## 2016-12-30 DIAGNOSIS — E039 Hypothyroidism, unspecified: Secondary | ICD-10-CM | POA: Diagnosis not present

## 2016-12-30 DIAGNOSIS — M1990 Unspecified osteoarthritis, unspecified site: Secondary | ICD-10-CM | POA: Diagnosis not present

## 2016-12-30 DIAGNOSIS — G4733 Obstructive sleep apnea (adult) (pediatric): Secondary | ICD-10-CM | POA: Diagnosis not present

## 2017-01-01 DIAGNOSIS — B1920 Unspecified viral hepatitis C without hepatic coma: Secondary | ICD-10-CM | POA: Diagnosis not present

## 2017-01-01 DIAGNOSIS — J9611 Chronic respiratory failure with hypoxia: Secondary | ICD-10-CM | POA: Diagnosis not present

## 2017-01-01 DIAGNOSIS — E039 Hypothyroidism, unspecified: Secondary | ICD-10-CM | POA: Diagnosis not present

## 2017-01-01 DIAGNOSIS — M25561 Pain in right knee: Secondary | ICD-10-CM | POA: Diagnosis not present

## 2017-01-01 DIAGNOSIS — M1990 Unspecified osteoarthritis, unspecified site: Secondary | ICD-10-CM | POA: Diagnosis not present

## 2017-01-01 DIAGNOSIS — J441 Chronic obstructive pulmonary disease with (acute) exacerbation: Secondary | ICD-10-CM | POA: Diagnosis not present

## 2017-01-01 DIAGNOSIS — M25551 Pain in right hip: Secondary | ICD-10-CM | POA: Diagnosis not present

## 2017-01-01 DIAGNOSIS — G4733 Obstructive sleep apnea (adult) (pediatric): Secondary | ICD-10-CM | POA: Diagnosis not present

## 2017-01-01 DIAGNOSIS — R69 Illness, unspecified: Secondary | ICD-10-CM | POA: Diagnosis not present

## 2017-01-01 DIAGNOSIS — E119 Type 2 diabetes mellitus without complications: Secondary | ICD-10-CM | POA: Diagnosis not present

## 2017-01-03 NOTE — Telephone Encounter (Signed)
MR please advise if you are willing to fill this medication.  Thanks

## 2017-01-06 ENCOUNTER — Telehealth: Payer: Self-pay | Admitting: Internal Medicine

## 2017-01-06 NOTE — Telephone Encounter (Signed)
Spoke with pt, she states she is seeing Dr. Elsworth Soho for sleep and wants to know if he can sign off on the surgery clearance when she sees him on 11/19? She states she does not have the money to pay 2 copays. RA please advise,

## 2017-01-06 NOTE — Telephone Encounter (Signed)
MR would you be willing to write a letter for pt to have surgery or do you want her to come in for an office visit first?   ATC pt, no answer. Left message for pt to call back.

## 2017-01-06 NOTE — Telephone Encounter (Signed)
She was given hip clearance surgery 12/16/16. So, is this another surgery? Hernia? Yeah best she come and see Eric Form again or me  Thanks  Dr. Brand Males, M.D., [facility].C.P Pulmonary and Critical Care Medicine Staff Physician [place] [facility] Pager: [phone], If no answer or between  15:00h - 7:00h: call [phone]  01/06/2017 5:03 PM

## 2017-01-07 ENCOUNTER — Encounter: Payer: Self-pay | Admitting: Internal Medicine

## 2017-01-07 ENCOUNTER — Ambulatory Visit (INDEPENDENT_AMBULATORY_CARE_PROVIDER_SITE_OTHER): Payer: Medicare HMO | Admitting: Internal Medicine

## 2017-01-07 DIAGNOSIS — R21 Rash and other nonspecific skin eruption: Secondary | ICD-10-CM

## 2017-01-07 MED ORDER — NYSTATIN-TRIAMCINOLONE 100000-0.1 UNIT/GM-% EX OINT
1.0000 | TOPICAL_OINTMENT | Freq: Two times a day (BID) | CUTANEOUS | 0 refills | Status: DC
Start: 2017-01-07 — End: 2017-02-07

## 2017-01-07 MED ORDER — FLUCONAZOLE 150 MG PO TABS: 150.0000 mg | ORAL_TABLET | Freq: Once | ORAL | 0 refills | Status: AC

## 2017-01-07 MED ORDER — LORCASERIN HCL ER 20 MG PO TB24: 20.0000 mg | ORAL_TABLET | Freq: Every day | ORAL | 3 refills | Status: DC

## 2017-01-07 NOTE — Progress Notes (Signed)
   Subjective:    Patient ID: Joan [name], female    DOB: [birth year], 56 y.o.   MRN: [id number]  HPI The patient is a 56 YO female coming in for rash under her breasts. Going on for several months. She has tried cornstarch on it without relief as well as otc products.Getting worse. Painful and skin peeling. She denies fevers or chills. Trying not to scratch and tries to keep the area dry.  Review of Systems  Constitutional: Negative.   Respiratory: Negative for cough, chest tightness and shortness of breath.   Cardiovascular: Negative for chest pain, palpitations and leg swelling.  Gastrointestinal: Negative for abdominal distention, abdominal pain, constipation, diarrhea, nausea and vomiting.  Musculoskeletal: Negative.   Skin: Positive for rash.      Objective:   Physical Exam  Constitutional: She is oriented to person, place, and time. She appears well-developed and well-nourished.  HENT:  Head: Normocephalic and atraumatic.  Eyes: EOM are normal.  Neck: Normal range of motion.  Cardiovascular: Normal rate and regular rhythm.  Pulmonary/Chest: Effort normal and breath sounds normal. No respiratory distress. She has no wheezes. She has no rales.  Yeast rash under both breasts with skin breakdown and satellite lesions  Neurological: She is alert and oriented to person, place, and time. Coordination normal.  Skin: Skin is warm and dry.   Vitals:   01/07/17 1536  BP: 132/80  Pulse: 71  Temp: 97.8 F (36.6 C)  TempSrc: Oral  SpO2: 98%  Weight: 218 lb (98.9 kg)  Height: 5\' 7"  (1.702 m)      Assessment & Plan:

## 2017-01-07 NOTE — Telephone Encounter (Signed)
ATC pt, no answer. Left message for pt to call back.  

## 2017-01-07 NOTE — Telephone Encounter (Signed)
Can do

## 2017-01-07 NOTE — Patient Instructions (Addendum)
We have sent in the diflucan to take 1 pill today, then 1 pill on Friday, then 1 pill on Monday.  We have sent in a cream as well to use twice a day on the area.   We have sent in belviq for appetite to take daily.   Try taking unisom or melatonin for sleep.

## 2017-01-08 ENCOUNTER — Encounter: Payer: Self-pay | Admitting: Internal Medicine

## 2017-01-08 DIAGNOSIS — J449 Chronic obstructive pulmonary disease, unspecified: Secondary | ICD-10-CM | POA: Diagnosis not present

## 2017-01-08 DIAGNOSIS — R21 Rash and other nonspecific skin eruption: Secondary | ICD-10-CM | POA: Insufficient documentation

## 2017-01-08 DIAGNOSIS — J9611 Chronic respiratory failure with hypoxia: Secondary | ICD-10-CM | POA: Diagnosis not present

## 2017-01-08 DIAGNOSIS — J441 Chronic obstructive pulmonary disease with (acute) exacerbation: Secondary | ICD-10-CM | POA: Diagnosis not present

## 2017-01-08 MED ORDER — PREGABALIN 75 MG PO CAPS: 75.0000 mg | ORAL_CAPSULE | Freq: Two times a day (BID) | ORAL | 1 refills | Status: DC

## 2017-01-08 NOTE — Telephone Encounter (Signed)
lmtcb

## 2017-01-08 NOTE — Assessment & Plan Note (Signed)
Rx for fluconazole and nystatin/triamcinolone cream for the rash to eliminate.

## 2017-01-09 DIAGNOSIS — R69 Illness, unspecified: Secondary | ICD-10-CM | POA: Diagnosis not present

## 2017-01-09 DIAGNOSIS — G4730 Sleep apnea, unspecified: Secondary | ICD-10-CM | POA: Diagnosis not present

## 2017-01-09 DIAGNOSIS — Z0000 Encounter for general adult medical examination without abnormal findings: Secondary | ICD-10-CM | POA: Diagnosis not present

## 2017-01-09 DIAGNOSIS — E039 Hypothyroidism, unspecified: Secondary | ICD-10-CM | POA: Diagnosis not present

## 2017-01-09 DIAGNOSIS — K219 Gastro-esophageal reflux disease without esophagitis: Secondary | ICD-10-CM | POA: Diagnosis not present

## 2017-01-09 DIAGNOSIS — E785 Hyperlipidemia, unspecified: Secondary | ICD-10-CM | POA: Diagnosis not present

## 2017-01-09 DIAGNOSIS — I10 Essential (primary) hypertension: Secondary | ICD-10-CM | POA: Diagnosis not present

## 2017-01-09 DIAGNOSIS — J449 Chronic obstructive pulmonary disease, unspecified: Secondary | ICD-10-CM | POA: Diagnosis not present

## 2017-01-10 ENCOUNTER — Telehealth: Payer: Self-pay | Admitting: Internal Medicine

## 2017-01-10 NOTE — Telephone Encounter (Signed)
Spoke with patient. She is aware RA is willing to provide the letter. Patient has an appt with him next week. Will route this message to myself so that I can type letter for patient.

## 2017-01-10 NOTE — Telephone Encounter (Signed)
Lorcaserin HCl ER (BELVIQ XR) 20 MG TB24 Patient is calling about to follow up on this medication. Saying it was going to cost her over $300. She started the pharmacy sent over a form to be filled out for. I was trying to explain how a PA works. But she kept stopping me and saying but I need it done today, It was sent at the beginning of the week. I informed the CMA could give her a call back and explain how it works and where she is with it.

## 2017-01-13 ENCOUNTER — Encounter: Payer: Self-pay | Admitting: Pulmonary Disease

## 2017-01-13 ENCOUNTER — Ambulatory Visit: Payer: Medicare HMO | Admitting: Pulmonary Disease

## 2017-01-13 VITALS — BP 124/84 | HR 74 | Ht 67.0 in | Wt 217.0 lb

## 2017-01-13 DIAGNOSIS — Z720 Tobacco use: Secondary | ICD-10-CM

## 2017-01-13 DIAGNOSIS — Z01818 Encounter for other preprocedural examination: Secondary | ICD-10-CM

## 2017-01-13 DIAGNOSIS — G4733 Obstructive sleep apnea (adult) (pediatric): Secondary | ICD-10-CM | POA: Diagnosis not present

## 2017-01-13 DIAGNOSIS — G4730 Sleep apnea, unspecified: Secondary | ICD-10-CM | POA: Diagnosis not present

## 2017-01-13 DIAGNOSIS — J449 Chronic obstructive pulmonary disease, unspecified: Secondary | ICD-10-CM | POA: Diagnosis not present

## 2017-01-13 NOTE — Telephone Encounter (Signed)
Patient is calling to follow up on this. She states the pharmacy informed her they would send over a alter medication. She does not what it would of been called. She may not need the PA. Please follow up with patient.

## 2017-01-13 NOTE — Progress Notes (Signed)
Subjective:    Patient ID: Joan [name], female    DOB: 29-Jul-1960, 56 y.o.   MRN: [id number]  HPI   Chief Complaint  Patient presents with  . Sleep Consult    MR patient. Diagnosed with central sleep apnea 6 years ago. Is currently using a CPAP machine.    56 y.o. female former smoker with severe COPD and asthma referred to establish care for OSA.  She is also planning umbilical hernia surgery and needs surgical clearance  She moved from [place] to [place] in 2018.  She was hospitalized on a trip to [place] and then again after coming back to [place] and then established with my partner Dr. Chase Caller for pulmonary care.  She is maintained on a regimen of Symbicort, Spiriva and albuterol for breakthrough.  On her last visit she was cleared for right hip replacement surgery in December 2018 with due risk.  She would also like me to take over her pulmonary issues.  In the past she has been also placed on Xopenex but she cannot afford this and has since switched to a regular albuterol  She had a sleep study in [place] many years ago and has been maintained on a BiPAP machine since then.  She would like a new machine since her knobs have broken on this machine.  Baseline study is not available to me at the time of dictation. Prior to starting on BiPAP she had loud snoring and excessive daytime fatigue and somnolence and non-refreshing sleep which were improved after starting on the machine. Lately she also reports trouble falling asleep and increased nocturnal awakenings.  She has tried over-the-counter sleep aids and melatonin without much relief. Epworth sleepiness score is 14. Bedtime is between midnight and 1 AM, sleep latency can be 2-3 hours, reports 3-4 nocturnal awakenings and sometimes stays in bed until noon still feeling tired with occasional dryness of mouth but denies headaches. She was started on clonazepam for anxiety during her recent hospitalization  She smoked  more than 40 pack years before she finally quit in 10/2016.  She is still using Chantix to help  Significant tests/ events reviewed    10/2016 PFTs showed ratio 49, FEV1 of 36%, FVC 58% and DLCO of 34% consistent with severe obstruction.  09/2016 echo was normal.  ONO 11/2016 on CPAP/room air showed desaturation for 1 hour 45 minutes started on nocturnal oxygen>>   Past Medical History:  Diagnosis Date  . Alcoholism and drug addiction in family   . Anxiety   . Arthritis    "knees, hips, lower back" (09/25/2016)  . Asthma   . Bipolar disorder ([place])   . Chronic bronchitis ([place])    "all the time" (09/25/2016)  . Chronic leg pain    RLE  . Chronic lower back pain   . COPD (chronic obstructive pulmonary disease) ([place])   . Depression   . Emphysema of lung ([place])   . Hepatitis C    "treated back in 2001-2002"  . High cholesterol   . Hypertension   . Hypothyroidism   . OSA on CPAP   . Pneumonia    "all the time" (09/25/2016)  . Thyroid disease   . Type II diabetes mellitus ([place])      Past Surgical History:  Procedure Laterality Date  . BACK SURGERY    . CARDIAC CATHETERIZATION    . COLONOSCOPY    . CYST EXCISION     removal of cyst in sinuses  . DILATION  AND CURETTAGE OF UTERUS    . FOOT SURGERY Bilateral    bone removed from 4th and 5 th toes and put in pin then took pin out  . HERNIA REPAIR    . INCISION AND DRAINAGE  ~ 2014/2015   "removed mesh & infection"  . LACERATION REPAIR Right    repair wrist artery from laceration from ice skate  . LIPOMA EXCISION Right    fattty lipoma in neck  . NASAL SEPTUM SURGERY    . POSTERIOR LUMBAR FUSION  2015/2016 X 2   "added rods and screws"  . TUBAL LIGATION    . UMBILICAL HERNIA REPAIR  ~ 2013   w/mesh  . UPPER GI ENDOSCOPY       Allergies  Allergen Reactions  . Dilaudid [Hydromorphone Hcl] Other (See Comments)    "heavy sedation"  . Keflex [Cephalexin] Other (See Comments)    unspecified  . Prednisone Other (See  Comments)    "counter reacts"  . Shellfish Allergy Other (See Comments)    Stomach cramps  . Tetracyclines & Related Other (See Comments)    unspecified     Social History   Socioeconomic History  . Marital status: Widowed    Spouse name: Not on file  . Number of children: Not on file  . Years of education: Not on file  . Highest education level: Not on file  Social Needs  . Financial resource strain: Not on file  . Food insecurity - worry: Not on file  . Food insecurity - inability: Not on file  . Transportation needs - medical: Not on file  . Transportation needs - non-medical: Not on file  Occupational History  . Not on file  Tobacco Use  . Smoking status: Former Smoker    Packs/day: 1.00    Years: 44.00    Pack years: 44.00    Types: Cigarettes    Last attempt to quit: 11/22/2016    Years since quitting: 0.1  . Smokeless tobacco: Never Used  . Tobacco comment: pt quit smoking 11/22/16 and currently taking chantix  ep  Substance and Sexual Activity  . Alcohol use: Yes    Comment: 09/25/2016 "stopped a long time ago"  . Drug use: Yes    Types: "Crack" cocaine    Comment: 09/25/2016 "stopped 5 yr ago"  . Sexual activity: No  Other Topics Concern  . Not on file  Social History Narrative  . Not on file     Family History  Problem Relation Age of Onset  . Diabetes Mother   . Hypertension Mother   . Hypertension Father   . Cancer Father        lung     Review of Systems Positive for shortness of breath with activity, nonproductive cough, anxiety, joint stiffness  Constitutional: negative for anorexia, fevers and sweats  Eyes: negative for irritation, redness and visual disturbance  Ears, nose, mouth, throat, and face: negative for earaches, epistaxis, nasal congestion and sore throat  Respiratory: negative for cough, and wheezing  Cardiovascular: negative for chest pain,  lower extremity edema, orthopnea, palpitations and syncope  Gastrointestinal: negative  for abdominal pain, constipation, diarrhea, melena, nausea and vomiting  Genitourinary:negative for dysuria, frequency and hematuria  Hematologic/lymphatic: negative for bleeding, easy bruising and lymphadenopathy  Musculoskeletal:negative for arthralgias, muscle weakness and stiff joints  Neurological: negative for coordination problems, gait problems, headaches and weakness  Endocrine: negative for diabetic symptoms including polydipsia, polyuria and weight loss     Objective:  Physical Exam  Gen. Pleasant, obese, in no distress, normal affect ENT - no lesions, no post nasal drip, class 2-3 airway Neck: No JVD, no thyromegaly, no carotid bruits Lungs: no use of accessory muscles, no dullness to percussion, decreased without rales or rhonchi  Cardiovascular: Rhythm regular, heart sounds  normal, no murmurs or gallops, no peripheral edema Abdomen: soft and non-tender, no hepatosplenomegaly, BS normal. Musculoskeletal: No deformities, no cyanosis or clubbing Neuro:  alert, non focal, no tremors       Assessment & Plan:

## 2017-01-13 NOTE — Patient Instructions (Signed)
We will try to obtain your old sleep studies from DME or from a lung doctor up [place].  Schedule split sleep study -based on this, we will try to get you a new CPAP machine.  You will be cleared for hernia surgery understanding that you have moderate to high risk for postoperative complications due to your COPD and sleep apnea.  Trial of melatonin 5-10 mg over-the-counter -take this 1 hour before bedtime

## 2017-01-13 NOTE — Telephone Encounter (Signed)
LVM for patient in regard to the PA for the [place], told her to call back and let me know if I need to do one or not and that I will be on the look out for a fax from pharmacy with an alternate medication.

## 2017-01-13 NOTE — Progress Notes (Signed)
Fax sent for records from Dr. Ericka Pontiff.

## 2017-01-14 DIAGNOSIS — G4733 Obstructive sleep apnea (adult) (pediatric): Secondary | ICD-10-CM | POA: Insufficient documentation

## 2017-01-14 NOTE — Assessment & Plan Note (Signed)
High risk for relapse

## 2017-01-14 NOTE — Assessment & Plan Note (Signed)
She would be at moderate to high risk for umbilical hernia surgery for postop pulmonary complications based on her severe COPD and OSA.  She would still like to proceed with this, would defer the discussion between her and the surgeon.  We would be happy to assist postoperatively as required

## 2017-01-14 NOTE — Assessment & Plan Note (Signed)
We will try to obtain your old sleep studies from DME or from her PCP up [place].  Schedule split sleep study -based on this, we will try to get you a new PAP machine.  Trial of melatonin 5-10 mg over-the-counter -take this 1 hour before bedtime

## 2017-01-14 NOTE — Assessment & Plan Note (Signed)
Ct Symbicort and Spiriva  Limit albuterol use due to tachyarrhythmias

## 2017-01-17 DIAGNOSIS — J441 Chronic obstructive pulmonary disease with (acute) exacerbation: Secondary | ICD-10-CM | POA: Diagnosis not present

## 2017-01-17 DIAGNOSIS — J9611 Chronic respiratory failure with hypoxia: Secondary | ICD-10-CM | POA: Diagnosis not present

## 2017-01-17 DIAGNOSIS — J449 Chronic obstructive pulmonary disease, unspecified: Secondary | ICD-10-CM | POA: Diagnosis not present

## 2017-01-22 ENCOUNTER — Telehealth: Payer: Self-pay | Admitting: Pulmonary Disease

## 2017-01-22 DIAGNOSIS — J449 Chronic obstructive pulmonary disease, unspecified: Secondary | ICD-10-CM | POA: Diagnosis not present

## 2017-01-22 DIAGNOSIS — Z6832 Body mass index (BMI) 32.0-32.9, adult: Secondary | ICD-10-CM | POA: Diagnosis not present

## 2017-01-22 DIAGNOSIS — K432 Incisional hernia without obstruction or gangrene: Secondary | ICD-10-CM | POA: Diagnosis not present

## 2017-01-22 DIAGNOSIS — Z720 Tobacco use: Secondary | ICD-10-CM | POA: Diagnosis not present

## 2017-01-22 DIAGNOSIS — E039 Hypothyroidism, unspecified: Secondary | ICD-10-CM | POA: Diagnosis not present

## 2017-01-22 DIAGNOSIS — R7303 Prediabetes: Secondary | ICD-10-CM | POA: Diagnosis not present

## 2017-01-22 NOTE — Telephone Encounter (Signed)
Dr. Elsworth Soho you stated pt is cleared for surgery but no letter written. Letter pended for RA signature. Will route to Cherina to print.

## 2017-01-23 NOTE — Telephone Encounter (Signed)
Spoke with patient. She needs the letter to be faxed to [facility] Attn Dr. Fanny Skates. Letter has been faxed. Patient is aware.   Will keep letter in my look-at folder for a week.

## 2017-01-23 NOTE — Telephone Encounter (Signed)
Will have RA sign the letter when I return to [place] this afternoon.

## 2017-01-27 ENCOUNTER — Ambulatory Visit: Payer: Medicare HMO | Admitting: Internal Medicine

## 2017-01-31 NOTE — Patient Instructions (Addendum)
Tonda Wiederhold  01/31/2017   Your procedure is scheduled on: 02-05-17   Report to [facility] Main  Entrance Take [address] to 3rd floor to  [place] at 8:30 AM.   Call this number if you have problems the morning of surgery [phone]    Remember: ONLY 1 PERSON MAY GO WITH YOU TO SHORT STAY TO GET  READY MORNING OF [place].  Do not eat food or drink liquids :After Midnight.     Take these medicines the morning of surgery with A SIP OF WATER: Atenolol (Tenormin), Levothyroxine (Synthroid), and Venlafaxine-XR (Effexor).You may also bring and use your inhaler as needed.                                 You may not have any metal on your body including hair pins and              piercings  Do not wear jewelry, make-up, lotions, powders or perfumes, deodorant             Do not wear nail polish.  Do not shave  48 hours prior to surgery.                 Do not bring valuables to the hospital. [place].  Contacts, dentures or bridgework may not be worn into surgery.  Leave suitcase in the car. After surgery it may be brought to your room.                Please read over the following fact sheets you were given: _____________________________________________________________________             [facility] - Preparing for Surgery Before surgery, you can play an important role.  Because skin is not sterile, your skin needs to be as free of germs as possible.  You can reduce the number of germs on your skin by washing with CHG (chlorahexidine gluconate) soap before surgery.  CHG is an antiseptic cleaner which kills germs and bonds with the skin to continue killing germs even after washing. Please DO NOT use if you have an allergy to CHG or antibacterial soaps.  If your skin becomes reddened/irritated stop using the CHG and inform your nurse when you arrive at Short Stay. Do not shave (including legs  and underarms) for at least 48 hours prior to the first CHG shower.  You may shave your face/neck. Please follow these instructions carefully:  1.  Shower with CHG Soap the night before surgery and the  morning of Surgery.  2.  If you choose to wash your hair, wash your hair first as usual with your  normal  shampoo.  3.  After you shampoo, rinse your hair and body thoroughly to remove the  shampoo.                           4.  Use CHG as you would any other liquid soap.  You can apply chg directly  to the skin and wash                       Gently with a scrungie  or clean washcloth.  5.  Apply the CHG Soap to your body ONLY FROM THE NECK DOWN.   Do not use on face/ open                           Wound or open sores. Avoid contact with eyes, ears mouth and genitals (private parts).                       Wash face,  Genitals (private parts) with your normal soap.             6.  Wash thoroughly, paying special attention to the area where your surgery  will be performed.  7.  Thoroughly rinse your body with warm water from the neck down.  8.  DO NOT shower/wash with your normal soap after using and rinsing off  the CHG Soap.                9.  Pat yourself dry with a clean towel.            10.  Wear clean pajamas.            11.  Place clean sheets on your bed the night of your first shower and do not  sleep with pets. Day of Surgery : Do not apply any lotions/deodorants the morning of surgery.  Please wear clean clothes to the hospital/surgery center.  FAILURE TO FOLLOW THESE INSTRUCTIONS MAY RESULT IN THE CANCELLATION OF YOUR SURGERY PATIENT SIGNATURE_________________________________  NURSE SIGNATURE__________________________________  ________________________________________________________________________   Adam Phenix  An incentive spirometer is a tool that can help keep your lungs clear and active. This tool measures how well you are filling your lungs with each breath.  Taking long deep breaths may help reverse or decrease the chance of developing breathing (pulmonary) problems (especially infection) following:  A long period of time when you are unable to move or be active. BEFORE THE PROCEDURE   If the spirometer includes an indicator to show your best effort, your nurse or respiratory therapist will set it to a desired goal.  If possible, sit up straight or lean slightly forward. Try not to slouch.  Hold the incentive spirometer in an upright position. INSTRUCTIONS FOR USE  1. Sit on the edge of your bed if possible, or sit up as far as you can in bed or on a chair. 2. Hold the incentive spirometer in an upright position. 3. Breathe out normally. 4. Place the mouthpiece in your mouth and seal your lips tightly around it. 5. Breathe in slowly and as deeply as possible, raising the piston or the ball toward the top of the column. 6. Hold your breath for 3-5 seconds or for as long as possible. Allow the piston or ball to fall to the bottom of the column. 7. Remove the mouthpiece from your mouth and breathe out normally. 8. Rest for a few seconds and repeat Steps 1 through 7 at least 10 times every 1-2 hours when you are awake. Take your time and take a few normal breaths between deep breaths. 9. The spirometer may include an indicator to show your best effort. Use the indicator as a goal to work toward during each repetition. 10. After each set of 10 deep breaths, practice coughing to be sure your lungs are clear. If you have an incision (the cut made at the time of surgery), support your incision when  coughing by placing a pillow or rolled up towels firmly against it. Once you are able to get out of bed, walk around indoors and cough well. You may stop using the incentive spirometer when instructed by your caregiver.  RISKS AND COMPLICATIONS  Take your time so you do not get dizzy or light-headed.  If you are in pain, you may need to take or ask for pain  medication before doing incentive spirometry. It is harder to take a deep breath if you are having pain. AFTER USE  Rest and breathe slowly and easily.  It can be helpful to keep track of a log of your progress. Your caregiver can provide you with a simple table to help with this. If you are using the spirometer at home, follow these instructions: [place] IF:   You are having difficultly using the spirometer.  You have trouble using the spirometer as often as instructed.  Your pain medication is not giving enough relief while using the spirometer.  You develop fever of 100.5 F (38.1 C) or higher. SEEK IMMEDIATE MEDICAL CARE IF:   You cough up bloody sputum that had not been present before.  You develop fever of 102 F (38.9 C) or greater.  You develop worsening pain at or near the incision site. MAKE SURE YOU:   Understand these instructions.  Will watch your condition.  Will get help right away if you are not doing well or get worse. Document Released: 06/24/2006 Document Revised: 05/06/2011 Document Reviewed: 08/25/2006 ExitCare Patient Information 2014 ExitCare, [place].   ________________________________________________________________________  WHAT IS A BLOOD TRANSFUSION? Blood Transfusion Information  A transfusion is the replacement of blood or some of its parts. Blood is made up of multiple cells which provide different functions.  Red blood cells carry oxygen and are used for blood loss replacement.  White blood cells fight against infection.  Platelets control bleeding.  Plasma helps clot blood.  Other blood products are available for specialized needs, such as hemophilia or other clotting disorders. BEFORE THE TRANSFUSION  Who gives blood for transfusions?   Healthy volunteers who are fully evaluated to make sure their blood is safe. This is blood bank blood. Transfusion therapy is the safest it has ever been in the practice of medicine.  Before blood is taken from a donor, a complete history is taken to make sure that person has no history of diseases nor engages in risky social behavior (examples are intravenous drug use or sexual activity with multiple partners). The donor's travel history is screened to minimize risk of transmitting infections, such as malaria. The donated blood is tested for signs of infectious diseases, such as HIV and hepatitis. The blood is then tested to be sure it is compatible with you in order to minimize the chance of a transfusion reaction. If you or a relative donates blood, this is often done in anticipation of surgery and is not appropriate for emergency situations. It takes many days to process the donated blood. RISKS AND COMPLICATIONS Although transfusion therapy is very safe and saves many lives, the main dangers of transfusion include:   Getting an infectious disease.  Developing a transfusion reaction. This is an allergic reaction to something in the blood you were given. Every precaution is taken to prevent this. The decision to have a blood transfusion has been considered carefully by your caregiver before blood is given. Blood is not given unless the benefits outweigh the risks. AFTER THE TRANSFUSION  Right after receiving  a blood transfusion, you will usually feel much better and more energetic. This is especially true if your red blood cells have gotten low (anemic). The transfusion raises the level of the red blood cells which carry oxygen, and this usually causes an energy increase.  The nurse administering the transfusion will monitor you carefully for complications. HOME CARE INSTRUCTIONS  No special instructions are needed after a transfusion. You may find your energy is better. Speak with your caregiver about any limitations on activity for underlying diseases you may have. SEEK MEDICAL CARE IF:   Your condition is not improving after your transfusion.  You develop redness or  irritation at the intravenous (IV) site. SEEK IMMEDIATE MEDICAL CARE IF:  Any of the following symptoms occur over the next 12 hours:  Shaking chills.  You have a temperature by mouth above 102 F (38.9 C), not controlled by medicine.  Chest, back, or muscle pain.  People around you feel you are not acting correctly or are confused.  Shortness of breath or difficulty breathing.  Dizziness and fainting.  You get a rash or develop hives.  You have a decrease in urine output.  Your urine turns a dark color or changes to pink, red, or brown. Any of the following symptoms occur over the next 10 days:  You have a temperature by mouth above 102 F (38.9 C), not controlled by medicine.  Shortness of breath.  Weakness after normal activity.  The white part of the eye turns yellow (jaundice).  You have a decrease in the amount of urine or are urinating less often.  Your urine turns a dark color or changes to pink, red, or brown. Document Released: 02/09/2000 Document Revised: 05/06/2011 Document Reviewed: 09/28/2007 [facility] Patient Information 2014 [place], [place].  _______________________________________________________________________

## 2017-01-31 NOTE — Progress Notes (Addendum)
12-16-16 Pulmonary clearance on chart from Eric Form, NP  09-27-16 (Epic) ECHO, CXR  09-24-16 (Epic) EKG

## 2017-02-03 ENCOUNTER — Encounter (HOSPITAL_COMMUNITY): Payer: Self-pay

## 2017-02-03 ENCOUNTER — Other Ambulatory Visit: Payer: Self-pay

## 2017-02-03 ENCOUNTER — Ambulatory Visit (HOSPITAL_COMMUNITY)
Admission: RE | Admit: 2017-02-03 | Discharge: 2017-02-03 | Disposition: A | Discharge status: Home / Self Care | Payer: Medicare HMO | Source: Ambulatory Visit | Attending: Surgical | Admitting: Surgical

## 2017-02-03 ENCOUNTER — Encounter (HOSPITAL_COMMUNITY)
Admission: RE | Admit: 2017-02-03 | Discharge: 2017-02-03 | Disposition: A | Discharge status: Home / Self Care | Payer: Medicare HMO | Source: Ambulatory Visit | Attending: Orthopedic Surgery | Admitting: Orthopedic Surgery

## 2017-02-03 DIAGNOSIS — Z720 Tobacco use: Secondary | ICD-10-CM | POA: Insufficient documentation

## 2017-02-03 DIAGNOSIS — G4733 Obstructive sleep apnea (adult) (pediatric): Secondary | ICD-10-CM

## 2017-02-03 DIAGNOSIS — F419 Anxiety disorder, unspecified: Secondary | ICD-10-CM

## 2017-02-03 DIAGNOSIS — M1611 Unilateral primary osteoarthritis, right hip: Secondary | ICD-10-CM

## 2017-02-03 DIAGNOSIS — J439 Emphysema, unspecified: Secondary | ICD-10-CM

## 2017-02-03 DIAGNOSIS — Z79899 Other long term (current) drug therapy: Secondary | ICD-10-CM | POA: Insufficient documentation

## 2017-02-03 DIAGNOSIS — Z79890 Hormone replacement therapy (postmenopausal): Secondary | ICD-10-CM

## 2017-02-03 DIAGNOSIS — Z01818 Encounter for other preprocedural examination: Secondary | ICD-10-CM

## 2017-02-03 DIAGNOSIS — I10 Essential (primary) hypertension: Secondary | ICD-10-CM | POA: Insufficient documentation

## 2017-02-03 DIAGNOSIS — Z7901 Long term (current) use of anticoagulants: Secondary | ICD-10-CM | POA: Insufficient documentation

## 2017-02-03 LAB — COMPREHENSIVE METABOLIC PANEL
ALT: 34 U/L (ref 14–54)
AST: 27 U/L (ref 15–41)
Albumin: 4.3 g/dL (ref 3.5–5.0)
Alkaline Phosphatase: 135 U/L — ABNORMAL HIGH (ref 38–126)
Anion gap: 9 (ref 5–15)
BUN: 20 mg/dL (ref 6–20)
CO2: 27 mmol/L (ref 22–32)
Calcium: 9.5 mg/dL (ref 8.9–10.3)
Chloride: 102 mmol/L (ref 101–111)
Creatinine, Ser: 0.99 mg/dL (ref 0.44–1.00)
GFR calc Af Amer: >60 mL/min (ref >60)
GFR calc non Af Amer: >60 mL/min (ref >60)
Glucose, Bld: 132 mg/dL — ABNORMAL HIGH (ref 65–99)
Potassium: 4.2 mmol/L (ref 3.5–5.1)
Sodium: 138 mmol/L (ref 135–145)
Total Bilirubin: 0.5 mg/dL (ref 0.3–1.2)
Total Protein: 7.3 g/dL (ref 6.5–8.1)

## 2017-02-03 LAB — CBC WITH DIFFERENTIAL/PLATELET
Basophils Absolute: 0.1 10*3/uL (ref 0.0–0.1)
Basophils Relative: 1 %
Eosinophils Absolute: 0.3 10*3/uL (ref 0.0–0.7)
Eosinophils Relative: 3 %
HCT: 40.9 % (ref 36.0–46.0)
Hemoglobin: 14 g/dL (ref 12.0–15.0)
Lymphocytes Relative: 25 %
Lymphs Abs: 3 10*3/uL (ref 0.7–4.0)
MCH: 32.3 pg (ref 26.0–34.0)
MCHC: 34.2 g/dL (ref 30.0–36.0)
MCV: 94.2 fL (ref 78.0–100.0)
Monocytes Absolute: 0.9 10*3/uL (ref 0.1–1.0)
Monocytes Relative: 7 %
Neutro Abs: 7.9 10*3/uL — ABNORMAL HIGH (ref 1.7–7.7)
Neutrophils Relative %: 64 %
Platelets: 259 10*3/uL (ref 150–400)
RBC: 4.34 MIL/uL (ref 3.87–5.11)
RDW: 13.1 % (ref 11.5–15.5)
WBC: 12.2 10*3/uL — ABNORMAL HIGH (ref 4.0–10.5)

## 2017-02-03 LAB — URINALYSIS, COMPLETE (UACMP) WITH MICROSCOPIC
Bilirubin Urine: NEGATIVE
Glucose, UA: NEGATIVE mg/dL
Ketones, ur: NEGATIVE mg/dL
Leukocytes, UA: NEGATIVE
Nitrite: NEGATIVE
Protein, ur: NEGATIVE mg/dL
Specific Gravity, Urine: 1.016 (ref 1.005–1.030)
pH: 5 (ref 5.0–8.0)

## 2017-02-03 LAB — PROTIME-INR
INR: 0.94
Prothrombin Time: 12.5 seconds (ref 11.4–15.2)

## 2017-02-03 LAB — ABO/RH: ABO/RH(D): B POS

## 2017-02-03 LAB — GLUCOSE, CAPILLARY: Glucose-Capillary: 140 mg/dL — ABNORMAL HIGH (ref 65–99)

## 2017-02-03 LAB — APTT: aPTT: 27 seconds (ref 24–36)

## 2017-02-03 NOTE — Progress Notes (Signed)
Pt reports at PAT appt that she is not dabetic,and is not on any diabetic medication. However, she would like her blood glucose checked during her hosptial stay, as her blood glucose tends to rise when she is hospitalized.

## 2017-02-04 ENCOUNTER — Encounter (HOSPITAL_COMMUNITY): Payer: Self-pay | Admitting: Anesthesiology

## 2017-02-04 LAB — HEMOGLOBIN A1C
Hgb A1c MFr Bld: 6 % — ABNORMAL HIGH (ref 4.8–5.6)
Mean Plasma Glucose: 125.5 mg/dL

## 2017-02-04 LAB — SURGICAL PCR SCREEN
MRSA, PCR: POSITIVE — AB
Staphylococcus aureus: POSITIVE — AB

## 2017-02-04 NOTE — Progress Notes (Signed)
Pt made aware that 02-05-17 surgery time has been changed from 11:00 AM to 8:30 AM. Pt to arrive at Short Stay at 6:30 AM, and to remain NPO after midnight... Pt verbalized understanding.

## 2017-02-04 NOTE — Anesthesia Preprocedure Evaluation (Addendum)
Anesthesia Evaluation  Patient identified by MRN, date of birth, ID band Patient awake    Reviewed: Allergy & Precautions, NPO status , Patient's Chart, lab work & pertinent test results  Airway Mallampati: I       Dental  (+) Poor Dentition,    Pulmonary former smoker,    Pulmonary exam normal breath sounds clear to auscultation       Cardiovascular hypertension, Pt. on home beta blockers Normal cardiovascular exam Rhythm:Regular Rate:Normal     Neuro/Psych    GI/Hepatic negative GI ROS,   Endo/Other  diabetes  Renal/GU      Musculoskeletal   Abdominal (+) + obese,   Peds  Hematology negative hematology ROS (+)   Anesthesia Other Findings   Reproductive/Obstetrics                           Anesthesia Physical Anesthesia Plan  ASA: III  Anesthesia Plan: General   Post-op Pain Management:    Induction: Intravenous  PONV Risk Score and Plan: 4 or greater and Scopolamine patch - Pre-op, Ondansetron, Dexamethasone and Midazolam  Airway Management Planned: Oral ETT  Additional Equipment:   Intra-op Plan:   Post-operative Plan: Extubation in OR  Informed Consent: I have reviewed the patients History and Physical, chart, labs and discussed the procedure including the risks, benefits and alternatives for the proposed anesthesia with the patient or authorized representative who has indicated his/her understanding and acceptance.     Plan Discussed with: CRNA and Surgeon  Anesthesia Plan Comments:        Anesthesia Quick Evaluation

## 2017-02-04 NOTE — H&P (Signed)
TOTAL HIP ADMISSION H&P  Patient is admitted for right total hip arthroplasty.  Subjective:  Chief Complaint: right hip pain  HPI: Joan [name], 56 y.o. female, has a history of pain and functional disability in the right hip(s) due to arthritis and patient has failed non-surgical conservative treatments for greater than 12 weeks to include NSAID's and/or analgesics, flexibility and strengthening excercises, use of assistive devices and activity modification.  Onset of symptoms was gradual starting 2 years ago with gradually worsening course since that time.The patient noted no past surgery on the right hip(s).  Patient currently rates pain in the right hip at 7 out of 10 with activity. Patient has night pain, worsening of pain with activity and weight bearing, pain that interfers with activities of daily living and pain with passive range of motion. Patient has evidence of periarticular osteophytes and joint space narrowing by imaging studies. This condition presents safety issues increasing the risk of falls.  There is no current active infection.  Patient Active Problem List   Diagnosis Date Noted  . OSA treated with BiPAP 01/14/2017  . Rash 01/08/2017  . Pre-operative clearance 12/16/2016  . Chronic respiratory failure with hypoxia ([place]) 12/16/2016  . Essential hypertension 11/29/2016  . Back pain 11/29/2016  . Vocal cord dysfunction   . COPD, severe ([place]) 09/24/2016  . Tobacco abuse 09/24/2016  . Anxiety 09/24/2016  . Steroid-induced diabetes ([place]) 09/24/2016   Past Medical History:  Diagnosis Date  . Alcoholism and drug addiction in family   . Anxiety   . Arthritis    "knees, hips, lower back" (09/25/2016)  . Asthma   . Bipolar disorder ([place])   . Chronic bronchitis ([place])    "all the time" (09/25/2016)  . Chronic leg pain    RLE  . Chronic lower back pain   . COPD (chronic obstructive pulmonary disease) ([place])   . Depression   . Emphysema of lung ([place])   . Hepatitis C     "treated back in 2001-2002"  . High cholesterol   . Hypertension   . Hypothyroidism   . OSA on CPAP   . Pneumonia    "all the time" (09/25/2016)  . Thyroid disease   . Type II diabetes mellitus ([place])     Past Surgical History:  Procedure Laterality Date  . BACK SURGERY    . CARDIAC CATHETERIZATION    . COLONOSCOPY    . CYST EXCISION     removal of cyst in sinuses  . DILATION AND CURETTAGE OF UTERUS    . FOOT SURGERY Bilateral    bone removed from 4th and 5 th toes and put in pin then took pin out  . HERNIA REPAIR    . INCISION AND DRAINAGE  ~ 2014/2015   "removed mesh & infection"  . LACERATION REPAIR Right    repair wrist artery from laceration from ice skate  . LIPOMA EXCISION Right    fattty lipoma in neck  . NASAL SEPTUM SURGERY    . POSTERIOR LUMBAR FUSION  2015/2016 X 2   "added rods and screws"  . TUBAL LIGATION    . UMBILICAL HERNIA REPAIR  ~ 2013   w/mesh  . UPPER GI ENDOSCOPY        Current Outpatient Medications:  .  albuterol (PROVENTIL HFA;VENTOLIN HFA) 108 (90 Base) MCG/ACT inhaler, Inhale 2 puffs into the lungs every 6 (six) hours as needed for wheezing or shortness of breath (before activity)., Disp: , Rfl:  .  albuterol (  PROVENTIL) (2.5 MG/3ML) 0.083% nebulizer solution, Take 3 mLs (2.5 mg total) by nebulization every 6 (six) hours as needed for wheezing or shortness of breath., Disp: 75 mL, Rfl: 12 .  atenolol (TENORMIN) 25 MG tablet, Take 1 tablet (25 mg total) by mouth daily., Disp: 90 tablet, Rfl: 2 .  atorvastatin (LIPITOR) 20 MG tablet, Take 1 tablet (20 mg total) by mouth daily. (Patient taking differently: Take 20 mg by mouth daily. ), Disp: 90 tablet, Rfl: 2 .  baclofen (LIORESAL) 10 MG tablet, Take 10 mg by mouth at bedtime., Disp: , Rfl:  .  budesonide-formoterol (SYMBICORT) 160-4.5 MCG/ACT inhaler, Inhale 2 puffs into the lungs 2 (two) times daily., Disp: 1 Inhaler, Rfl: 12 .  diphenhydrAMINE (BENADRYL) 25 mg capsule, Take 25 mg by mouth at  bedtime., Disp: , Rfl:  .  levothyroxine (SYNTHROID, LEVOTHROID) 25 MCG tablet, Take 1 tablet (25 mcg total) by mouth daily before breakfast., Disp: 90 tablet, Rfl: 2 .  montelukast (SINGULAIR) 10 MG tablet, Take 10 mg by mouth at bedtime., Disp: , Rfl:  .  naproxen (NAPROSYN) 500 MG tablet, Take 500 mg by mouth 3 (three) times daily with meals. , Disp: , Rfl:  .  pregabalin (LYRICA) 75 MG capsule, Take 1 capsule (75 mg total) 2 (two) times daily by mouth., Disp: 180 capsule, Rfl: 1 .  roflumilast (DALIRESP) 500 MCG TABS tablet, Take 1 tablet (500 mcg total) by mouth daily., Disp: 30 tablet, Rfl: 1 .  tiotropium (SPIRIVA) 18 MCG inhalation capsule, Place 1 capsule (18 mcg total) into inhaler and inhale daily., Disp: 30 capsule, Rfl: 12 .  valACYclovir (VALTREX) 500 MG tablet, Take 500 mg by mouth daily. , Disp: , Rfl:  .  varenicline (CHANTIX CONTINUING MONTH PAK) 1 MG tablet, Take 1 tablet (1 mg total) by mouth 2 (two) times daily., Disp: 60 tablet, Rfl: 2 .  venlafaxine XR (EFFEXOR-XR) 150 MG 24 hr capsule, Take 1 capsule (150 mg total) by mouth daily with breakfast., Disp: 90 capsule, Rfl: 2  Allergies  Allergen Reactions  . Dilaudid [Hydromorphone Hcl] Other (See Comments)    "heavy sedation"  . Keflex [Cephalexin] Other (See Comments)    unspecified  . Prednisone Other (See Comments)    "counter reacts"  . Shellfish Allergy Other (See Comments)    Stomach cramps  . Tetracyclines & Related Other (See Comments)    unspecified    Social History   Tobacco Use  . Smoking status: Former Smoker    Packs/day: 1.00    Years: 44.00    Pack years: 44.00    Types: Cigarettes    Last attempt to quit: 11/22/2016    Years since quitting: 0.2  . Smokeless tobacco: Never Used  . Tobacco comment: pt quit smoking 11/22/16 and currently taking chantix  ep  Substance Use Topics  . Alcohol use: Yes    Comment: 09/25/2016 "stopped a long time ago"    Family History  Problem Relation Age of Onset   . Diabetes Mother   . Hypertension Mother   . Hypertension Father   . Cancer Father        lung     Review of Systems  Constitutional: Positive for malaise/fatigue. Negative for chills, diaphoresis, fever and weight loss.  HENT: Negative.   Eyes: Negative.   Respiratory: Positive for shortness of breath. Negative for cough, hemoptysis, sputum production and wheezing.        SOB with exertion  Cardiovascular: Negative.   Gastrointestinal:  Negative.   Genitourinary: Negative.   Musculoskeletal: Positive for back pain, joint pain and myalgias. Negative for falls and neck pain.  Skin: Negative.   Neurological: Negative.  Negative for weakness.  Endo/Heme/Allergies: Negative.   Psychiatric/Behavioral: Negative for depression, hallucinations, memory loss, substance abuse and suicidal ideas. The patient is nervous/anxious. The patient does not have insomnia.     Objective:  Physical Exam  Constitutional: She is oriented to person, place, and time. She appears well-developed. No distress.  Obese  HENT:  Head: Normocephalic and atraumatic.  Right Ear: External ear normal.  Left Ear: External ear normal.  Mouth/Throat: Oropharynx is clear and moist.  Eyes: Conjunctivae and EOM are normal.  Neck: Normal range of motion. Neck supple.  Cardiovascular: Normal rate, regular rhythm, normal heart sounds and intact distal pulses.  No murmur heard. Respiratory: Effort normal and breath sounds normal. No respiratory distress. She has no wheezes.  GI: Soft. Bowel sounds are normal. She exhibits no distension. There is no tenderness.  Musculoskeletal:       Right hip: She exhibits decreased range of motion, tenderness and crepitus.       Left hip: Normal.       Right knee: Normal.       Left knee: Normal.  Neurological: She is alert and oriented to person, place, and time. She has normal strength. No sensory deficit.  Skin: No rash noted. She is not diaphoretic. No erythema.  Psychiatric:  She has a normal mood and affect.    Vitals Weight: 218 lb Height: 67in Body Surface Area: 2.1 m Body Mass Index: 34.14 kg/m  Pulse: 64 (Regular)  BP: 130/84 (Sitting, Left Arm, Standard)  Imaging Review Plain radiographs demonstrate severe degenerative joint disease of the right hip(s). The bone quality appears to be good for age and reported activity level.  Assessment/Plan:  End stage primary osteoarthritis, right hip(s)  The patient history, physical examination, clinical judgement of the provider and imaging studies are consistent with end stage degenerative joint disease of the right hip(s) and total hip arthroplasty is deemed medically necessary. The treatment options including medical management, injection therapy, arthroscopy and arthroplasty were discussed at length. The risks and benefits of total hip arthroplasty were presented and reviewed. The risks due to aseptic loosening, infection, stiffness, dislocation/subluxation,  thromboembolic complications and other imponderables were discussed.  The patient acknowledged the explanation, agreed to proceed with the plan and consent was signed. Patient is being admitted for inpatient treatment for surgery, pain control, PT, OT, prophylactic antibiotics, VTE prophylaxis, progressive ambulation and ADL's and discharge planning.The patient is planning to be discharged home with home health services   PCP: Dr. Pricilla Holm, [place] Pulm: Dr. Quenten Raven, [place] Therapy Plans: HHPT (prefers [facility]) then [place] with brother; may need SNF depending on his availability DME: has equipment Other: hardware present in lumbar spine   Ardeen Jourdain, PA-C

## 2017-02-05 ENCOUNTER — Encounter (HOSPITAL_COMMUNITY): Payer: Self-pay | Admitting: *Deleted

## 2017-02-05 ENCOUNTER — Inpatient Hospital Stay (HOSPITAL_COMMUNITY): Payer: Medicare HMO | Admitting: Anesthesiology

## 2017-02-05 ENCOUNTER — Encounter (HOSPITAL_COMMUNITY)
Admission: RE | Disposition: A | Discharge status: Skilled Nursing Facility | Payer: Self-pay | Source: Ambulatory Visit | Attending: Orthopedic Surgery

## 2017-02-05 ENCOUNTER — Other Ambulatory Visit: Payer: Self-pay

## 2017-02-05 ENCOUNTER — Inpatient Hospital Stay (HOSPITAL_COMMUNITY)
Admission: RE | Admit: 2017-02-05 | Discharge: 2017-02-07 | DRG: 470 | Disposition: A | Discharge status: Skilled Nursing Facility | Payer: Medicare HMO | Source: Ambulatory Visit | Attending: Orthopedic Surgery | Admitting: Orthopedic Surgery

## 2017-02-05 ENCOUNTER — Inpatient Hospital Stay (HOSPITAL_COMMUNITY): Payer: Medicare HMO

## 2017-02-05 DIAGNOSIS — E668 Other obesity: Secondary | ICD-10-CM | POA: Diagnosis not present

## 2017-02-05 DIAGNOSIS — J439 Emphysema, unspecified: Secondary | ICD-10-CM | POA: Diagnosis not present

## 2017-02-05 DIAGNOSIS — E119 Type 2 diabetes mellitus without complications: Secondary | ICD-10-CM | POA: Diagnosis present

## 2017-02-05 DIAGNOSIS — Z791 Long term (current) use of non-steroidal anti-inflammatories (NSAID): Secondary | ICD-10-CM | POA: Diagnosis not present

## 2017-02-05 DIAGNOSIS — Z96641 Presence of right artificial hip joint: Secondary | ICD-10-CM | POA: Diagnosis not present

## 2017-02-05 DIAGNOSIS — G4733 Obstructive sleep apnea (adult) (pediatric): Secondary | ICD-10-CM | POA: Diagnosis not present

## 2017-02-05 DIAGNOSIS — S79911A Unspecified injury of right hip, initial encounter: Secondary | ICD-10-CM | POA: Diagnosis not present

## 2017-02-05 DIAGNOSIS — M25551 Pain in right hip: Secondary | ICD-10-CM | POA: Diagnosis not present

## 2017-02-05 DIAGNOSIS — Z96643 Presence of artificial hip joint, bilateral: Secondary | ICD-10-CM | POA: Diagnosis not present

## 2017-02-05 DIAGNOSIS — Z6833 Body mass index (BMI) 33.0-33.9, adult: Secondary | ICD-10-CM | POA: Diagnosis not present

## 2017-02-05 DIAGNOSIS — Z8249 Family history of ischemic heart disease and other diseases of the circulatory system: Secondary | ICD-10-CM

## 2017-02-05 DIAGNOSIS — I10 Essential (primary) hypertension: Secondary | ICD-10-CM | POA: Diagnosis present

## 2017-02-05 DIAGNOSIS — E7800 Pure hypercholesterolemia, unspecified: Secondary | ICD-10-CM | POA: Diagnosis not present

## 2017-02-05 DIAGNOSIS — M2170 Unequal limb length (acquired), unspecified site: Secondary | ICD-10-CM | POA: Diagnosis present

## 2017-02-05 DIAGNOSIS — B0081 Herpesviral hepatitis: Secondary | ICD-10-CM | POA: Diagnosis not present

## 2017-02-05 DIAGNOSIS — K219 Gastro-esophageal reflux disease without esophagitis: Secondary | ICD-10-CM | POA: Diagnosis not present

## 2017-02-05 DIAGNOSIS — M1611 Unilateral primary osteoarthritis, right hip: Principal | ICD-10-CM | POA: Diagnosis present

## 2017-02-05 DIAGNOSIS — J449 Chronic obstructive pulmonary disease, unspecified: Secondary | ICD-10-CM | POA: Diagnosis not present

## 2017-02-05 DIAGNOSIS — E785 Hyperlipidemia, unspecified: Secondary | ICD-10-CM | POA: Diagnosis not present

## 2017-02-05 DIAGNOSIS — Z79890 Hormone replacement therapy (postmenopausal): Secondary | ICD-10-CM | POA: Diagnosis not present

## 2017-02-05 DIAGNOSIS — D649 Anemia, unspecified: Secondary | ICD-10-CM | POA: Diagnosis not present

## 2017-02-05 DIAGNOSIS — Z833 Family history of diabetes mellitus: Secondary | ICD-10-CM | POA: Diagnosis not present

## 2017-02-05 DIAGNOSIS — Z7951 Long term (current) use of inhaled steroids: Secondary | ICD-10-CM | POA: Diagnosis not present

## 2017-02-05 DIAGNOSIS — E039 Hypothyroidism, unspecified: Secondary | ICD-10-CM | POA: Diagnosis not present

## 2017-02-05 DIAGNOSIS — M62838 Other muscle spasm: Secondary | ICD-10-CM | POA: Diagnosis not present

## 2017-02-05 DIAGNOSIS — Z471 Aftercare following joint replacement surgery: Secondary | ICD-10-CM | POA: Diagnosis not present

## 2017-02-05 DIAGNOSIS — M21751 Unequal limb length (acquired), right femur: Secondary | ICD-10-CM | POA: Diagnosis not present

## 2017-02-05 DIAGNOSIS — R69 Illness, unspecified: Secondary | ICD-10-CM | POA: Diagnosis not present

## 2017-02-05 DIAGNOSIS — Z96649 Presence of unspecified artificial hip joint: Secondary | ICD-10-CM

## 2017-02-05 DIAGNOSIS — T7840XA Allergy, unspecified, initial encounter: Secondary | ICD-10-CM | POA: Diagnosis not present

## 2017-02-05 DIAGNOSIS — Z981 Arthrodesis status: Secondary | ICD-10-CM | POA: Diagnosis not present

## 2017-02-05 DIAGNOSIS — J9611 Chronic respiratory failure with hypoxia: Secondary | ICD-10-CM | POA: Diagnosis not present

## 2017-02-05 DIAGNOSIS — J441 Chronic obstructive pulmonary disease with (acute) exacerbation: Secondary | ICD-10-CM | POA: Diagnosis not present

## 2017-02-05 DIAGNOSIS — E669 Obesity, unspecified: Secondary | ICD-10-CM | POA: Diagnosis not present

## 2017-02-05 DIAGNOSIS — G8918 Other acute postprocedural pain: Secondary | ICD-10-CM

## 2017-02-05 HISTORY — PX: TOTAL HIP ARTHROPLASTY: SHX124

## 2017-02-05 LAB — TYPE AND SCREEN
ABO/RH(D): B POS
Antibody Screen: NEGATIVE

## 2017-02-05 LAB — GLUCOSE, CAPILLARY: Glucose-Capillary: 170 mg/dL — ABNORMAL HIGH (ref 65–99)

## 2017-02-05 SURGERY — ARTHROPLASTY, HIP, TOTAL,POSTERIOR APPROACH
Anesthesia: General | Site: Hip | Laterality: Right

## 2017-02-05 MED ORDER — LIDOCAINE 2% (20 MG/ML) 5 ML SYRINGE
INTRAMUSCULAR | Status: DC | PRN
  Administered 2017-02-05: 100 mg via INTRAVENOUS

## 2017-02-05 MED ORDER — SUGAMMADEX SODIUM 200 MG/2ML IV SOLN
INTRAVENOUS | Status: AC
  Filled 2017-02-05: qty 2

## 2017-02-05 MED ORDER — PROMETHAZINE HCL 25 MG/ML IJ SOLN: 6.2500 mg | INTRAMUSCULAR | Status: DC | PRN

## 2017-02-05 MED ORDER — BUPIVACAINE LIPOSOME 1.3 % IJ SUSP
INTRAMUSCULAR | Status: DC | PRN
  Administered 2017-02-05: 20 mL

## 2017-02-05 MED ORDER — ROCURONIUM BROMIDE 50 MG/5ML IV SOSY
PREFILLED_SYRINGE | INTRAVENOUS | Status: AC
  Filled 2017-02-05: qty 5

## 2017-02-05 MED ORDER — ONDANSETRON HCL 4 MG/2ML IJ SOLN: 4.0000 mg | Freq: Four times a day (QID) | INTRAMUSCULAR | Status: DC | PRN

## 2017-02-05 MED ORDER — ALBUTEROL SULFATE HFA 108 (90 BASE) MCG/ACT IN AERS: 2.0000 | INHALATION_SPRAY | Freq: Four times a day (QID) | RESPIRATORY_TRACT | Status: DC | PRN

## 2017-02-05 MED ORDER — SCOPOLAMINE 1 MG/3DAYS TD PT72
MEDICATED_PATCH | TRANSDERMAL | Status: AC
  Filled 2017-02-05: qty 1

## 2017-02-05 MED ORDER — SODIUM CHLORIDE 0.9 % IV SOLN
INTRAVENOUS | Status: AC
  Filled 2017-02-05: qty 500000

## 2017-02-05 MED ORDER — BISACODYL 5 MG PO TBEC: 5.0000 mg | DELAYED_RELEASE_TABLET | Freq: Every day | ORAL | Status: DC | PRN

## 2017-02-05 MED ORDER — ACETAMINOPHEN 325 MG PO TABS
650.0000 mg | ORAL_TABLET | ORAL | Status: DC | PRN
Start: 2017-02-05 — End: 2017-02-07
  Administered 2017-02-06 – 2017-02-07 (×5): 650 mg via ORAL
  Filled 2017-02-05 (×5): qty 2

## 2017-02-05 MED ORDER — LABETALOL HCL 5 MG/ML IV SOLN
INTRAVENOUS | Status: DC | PRN
  Administered 2017-02-05: 5 mg via INTRAVENOUS

## 2017-02-05 MED ORDER — VALACYCLOVIR HCL 500 MG PO TABS
500.0000 mg | ORAL_TABLET | Freq: Every day | ORAL | Status: DC
  Administered 2017-02-05 – 2017-02-07 (×3): 500 mg via ORAL
  Filled 2017-02-05 (×3): qty 1

## 2017-02-05 MED ORDER — LACTATED RINGERS IV SOLN
INTRAVENOUS | Status: DC
  Administered 2017-02-05 – 2017-02-06 (×3): via INTRAVENOUS

## 2017-02-05 MED ORDER — HYDROMORPHONE HCL 1 MG/ML IJ SOLN
INTRAMUSCULAR | Status: AC
  Administered 2017-02-05: 0.25 mg via INTRAVENOUS
  Filled 2017-02-05: qty 2

## 2017-02-05 MED ORDER — POLYETHYLENE GLYCOL 3350 17 G PO PACK: 17.0000 g | PACK | Freq: Every day | ORAL | Status: DC | PRN

## 2017-02-05 MED ORDER — HYDROCODONE-ACETAMINOPHEN 5-325 MG PO TABS: 1.0000 | ORAL_TABLET | ORAL | Status: DC | PRN

## 2017-02-05 MED ORDER — PANTOPRAZOLE SODIUM 40 MG PO TBEC: 40.0000 mg | DELAYED_RELEASE_TABLET | Freq: Two times a day (BID) | ORAL | Status: DC

## 2017-02-05 MED ORDER — ALBUTEROL SULFATE (2.5 MG/3ML) 0.083% IN NEBU
2.5000 mg | INHALATION_SOLUTION | Freq: Four times a day (QID) | RESPIRATORY_TRACT | Status: DC | PRN
  Administered 2017-02-06 – 2017-02-07 (×2): 2.5 mg via RESPIRATORY_TRACT
  Filled 2017-02-05 (×2): qty 3

## 2017-02-05 MED ORDER — FERROUS SULFATE 325 (65 FE) MG PO TABS
325.0000 mg | ORAL_TABLET | Freq: Three times a day (TID) | ORAL | Status: DC
  Administered 2017-02-06 – 2017-02-07 (×5): 325 mg via ORAL
  Filled 2017-02-05 (×5): qty 1

## 2017-02-05 MED ORDER — ALBUTEROL SULFATE (2.5 MG/3ML) 0.083% IN NEBU
INHALATION_SOLUTION | RESPIRATORY_TRACT | Status: AC
  Filled 2017-02-05: qty 3

## 2017-02-05 MED ORDER — HYDROMORPHONE HCL 1 MG/ML IJ SOLN
0.2500 mg | INTRAMUSCULAR | Status: DC | PRN
  Administered 2017-02-05 (×3): 0.25 mg via INTRAVENOUS

## 2017-02-05 MED ORDER — SODIUM CHLORIDE 0.9 % IJ SOLN
INTRAMUSCULAR | Status: AC
  Filled 2017-02-05: qty 50

## 2017-02-05 MED ORDER — CHLORHEXIDINE GLUCONATE 4 % EX LIQD: 60.0000 mL | Freq: Once | CUTANEOUS | Status: DC

## 2017-02-05 MED ORDER — PROPOFOL 10 MG/ML IV BOLUS
INTRAVENOUS | Status: AC
  Filled 2017-02-05: qty 20

## 2017-02-05 MED ORDER — FENTANYL CITRATE (PF) 100 MCG/2ML IJ SOLN
INTRAMUSCULAR | Status: DC | PRN
  Administered 2017-02-05 (×2): 100 ug via INTRAVENOUS
  Administered 2017-02-05: 50 ug via INTRAVENOUS

## 2017-02-05 MED ORDER — ROCURONIUM BROMIDE 10 MG/ML (PF) SYRINGE
PREFILLED_SYRINGE | INTRAVENOUS | Status: DC | PRN
  Administered 2017-02-05: 50 mg via INTRAVENOUS
  Administered 2017-02-05: 10 mg via INTRAVENOUS

## 2017-02-05 MED ORDER — PHENOL 1.4 % MT LIQD: 1.0000 | OROMUCOSAL | Status: DC | PRN

## 2017-02-05 MED ORDER — MEPERIDINE HCL 50 MG/ML IJ SOLN: 6.2500 mg | INTRAMUSCULAR | Status: DC | PRN

## 2017-02-05 MED ORDER — ATENOLOL 25 MG PO TABS
25.0000 mg | ORAL_TABLET | Freq: Every day | ORAL | Status: DC
  Administered 2017-02-06 – 2017-02-07 (×2): 25 mg via ORAL
  Filled 2017-02-05 (×2): qty 1

## 2017-02-05 MED ORDER — ACETAMINOPHEN 650 MG RE SUPP: 650.0000 mg | RECTAL | Status: DC | PRN

## 2017-02-05 MED ORDER — MORPHINE SULFATE (PF) 4 MG/ML IV SOLN
1.0000 mg | INTRAVENOUS | Status: DC | PRN
  Administered 2017-02-05 – 2017-02-06 (×4): 1 mg via INTRAVENOUS
  Filled 2017-02-05 (×4): qty 1

## 2017-02-05 MED ORDER — FENTANYL CITRATE (PF) 100 MCG/2ML IJ SOLN
INTRAMUSCULAR | Status: AC
  Filled 2017-02-05: qty 2

## 2017-02-05 MED ORDER — RIVAROXABAN 10 MG PO TABS
10.0000 mg | ORAL_TABLET | Freq: Every day | ORAL | Status: DC
  Administered 2017-02-06 – 2017-02-07 (×2): 10 mg via ORAL
  Filled 2017-02-05 (×2): qty 1

## 2017-02-05 MED ORDER — BUPIVACAINE LIPOSOME 1.3 % IJ SUSP
20.0000 mL | Freq: Once | INTRAMUSCULAR | Status: DC
  Filled 2017-02-05: qty 20

## 2017-02-05 MED ORDER — ONDANSETRON HCL 4 MG PO TABS
4.0000 mg | ORAL_TABLET | Freq: Four times a day (QID) | ORAL | Status: DC | PRN
  Administered 2017-02-07: 4 mg via ORAL
  Filled 2017-02-05: qty 1

## 2017-02-05 MED ORDER — SODIUM CHLORIDE 0.9 % IJ SOLN
INTRAMUSCULAR | Status: DC | PRN
  Administered 2017-02-05: 20 mL via INTRAVENOUS

## 2017-02-05 MED ORDER — MIDAZOLAM HCL 5 MG/5ML IJ SOLN
INTRAMUSCULAR | Status: DC | PRN
  Administered 2017-02-05: 2 mg via INTRAVENOUS

## 2017-02-05 MED ORDER — SUGAMMADEX SODIUM 200 MG/2ML IV SOLN
INTRAVENOUS | Status: DC | PRN
  Administered 2017-02-05: 200 mg via INTRAVENOUS

## 2017-02-05 MED ORDER — METOCLOPRAMIDE HCL 5 MG/ML IJ SOLN: 5.0000 mg | Freq: Three times a day (TID) | INTRAMUSCULAR | Status: DC | PRN

## 2017-02-05 MED ORDER — MIDAZOLAM HCL 2 MG/2ML IJ SOLN
1.0000 mg | Freq: Once | INTRAMUSCULAR | Status: AC
  Administered 2017-02-05: 0.25 mg via INTRAVENOUS

## 2017-02-05 MED ORDER — VANCOMYCIN HCL IN DEXTROSE 1-5 GM/200ML-% IV SOLN
1000.0000 mg | Freq: Two times a day (BID) | INTRAVENOUS | Status: AC
  Administered 2017-02-05: 1000 mg via INTRAVENOUS
  Filled 2017-02-05: qty 200

## 2017-02-05 MED ORDER — ONDANSETRON HCL 4 MG/2ML IJ SOLN
INTRAMUSCULAR | Status: DC | PRN
Start: 2017-02-05 — End: 2017-02-05
  Administered 2017-02-05: 4 mg via INTRAVENOUS

## 2017-02-05 MED ORDER — CLONAZEPAM 0.5 MG PO TABS: 0.2500 mg | ORAL_TABLET | Freq: Two times a day (BID) | ORAL | Status: DC

## 2017-02-05 MED ORDER — VARENICLINE TARTRATE 1 MG PO TABS
1.0000 mg | ORAL_TABLET | Freq: Two times a day (BID) | ORAL | Status: DC
  Administered 2017-02-05 – 2017-02-07 (×4): 1 mg via ORAL
  Filled 2017-02-05 (×4): qty 1

## 2017-02-05 MED ORDER — MENTHOL 3 MG MT LOZG: 1.0000 | LOZENGE | OROMUCOSAL | Status: DC | PRN

## 2017-02-05 MED ORDER — METHOCARBAMOL 500 MG PO TABS
500.0000 mg | ORAL_TABLET | Freq: Four times a day (QID) | ORAL | Status: DC | PRN
  Administered 2017-02-05 – 2017-02-07 (×4): 500 mg via ORAL
  Filled 2017-02-05 (×4): qty 1

## 2017-02-05 MED ORDER — ROFLUMILAST 500 MCG PO TABS
500.0000 ug | ORAL_TABLET | Freq: Every day | ORAL | Status: DC
  Administered 2017-02-05 – 2017-02-07 (×3): 500 ug via ORAL
  Filled 2017-02-05 (×3): qty 1

## 2017-02-05 MED ORDER — MOMETASONE FURO-FORMOTEROL FUM 200-5 MCG/ACT IN AERO
2.0000 | INHALATION_SPRAY | Freq: Two times a day (BID) | RESPIRATORY_TRACT | Status: DC
  Administered 2017-02-05 – 2017-02-07 (×4): 2 via RESPIRATORY_TRACT
  Filled 2017-02-05: qty 8.8

## 2017-02-05 MED ORDER — SCOPOLAMINE 1 MG/3DAYS TD PT72
MEDICATED_PATCH | TRANSDERMAL | Status: DC | PRN
  Administered 2017-02-05: 1 via TRANSDERMAL

## 2017-02-05 MED ORDER — MIDAZOLAM HCL 2 MG/2ML IJ SOLN
INTRAMUSCULAR | Status: AC
  Filled 2017-02-05: qty 2

## 2017-02-05 MED ORDER — ALUM & MAG HYDROXIDE-SIMETH 200-200-20 MG/5ML PO SUSP: 30.0000 mL | ORAL | Status: DC | PRN

## 2017-02-05 MED ORDER — LEVOTHYROXINE SODIUM 25 MCG PO TABS
25.0000 ug | ORAL_TABLET | Freq: Every day | ORAL | Status: DC
  Administered 2017-02-06 – 2017-02-07 (×2): 25 ug via ORAL
  Filled 2017-02-05 (×2): qty 1

## 2017-02-05 MED ORDER — LIP MEDEX EX OINT
TOPICAL_OINTMENT | CUTANEOUS | Status: AC
  Filled 2017-02-05: qty 7

## 2017-02-05 MED ORDER — PREGABALIN 75 MG PO CAPS
75.0000 mg | ORAL_CAPSULE | Freq: Two times a day (BID) | ORAL | Status: DC
  Administered 2017-02-05 – 2017-02-07 (×4): 75 mg via ORAL
  Filled 2017-02-05 (×4): qty 1

## 2017-02-05 MED ORDER — LIDOCAINE 2% (20 MG/ML) 5 ML SYRINGE
INTRAMUSCULAR | Status: AC
  Filled 2017-02-05: qty 5

## 2017-02-05 MED ORDER — TRANEXAMIC ACID 1000 MG/10ML IV SOLN
1000.0000 mg | INTRAVENOUS | Status: AC
  Administered 2017-02-05: 1000 mg via INTRAVENOUS
  Filled 2017-02-05: qty 1100

## 2017-02-05 MED ORDER — SUCCINYLCHOLINE CHLORIDE 200 MG/10ML IV SOSY
PREFILLED_SYRINGE | INTRAVENOUS | Status: DC | PRN
  Administered 2017-02-05: 120 mg via INTRAVENOUS

## 2017-02-05 MED ORDER — POLYMYXIN B SULFATE 500000 UNITS IJ SOLR
INTRAMUSCULAR | Status: DC | PRN
  Administered 2017-02-05: 500 mL

## 2017-02-05 MED ORDER — STERILE WATER FOR IRRIGATION IR SOLN
Status: DC | PRN
  Administered 2017-02-05: 2000 mL

## 2017-02-05 MED ORDER — ALBUTEROL SULFATE (2.5 MG/3ML) 0.083% IN NEBU
2.5000 mg | INHALATION_SOLUTION | Freq: Four times a day (QID) | RESPIRATORY_TRACT | Status: DC | PRN
  Administered 2017-02-05: 2.5 mg via RESPIRATORY_TRACT

## 2017-02-05 MED ORDER — LABETALOL HCL 5 MG/ML IV SOLN
INTRAVENOUS | Status: AC
  Filled 2017-02-05: qty 4

## 2017-02-05 MED ORDER — VENLAFAXINE HCL ER 150 MG PO CP24
150.0000 mg | ORAL_CAPSULE | Freq: Every day | ORAL | Status: DC
  Administered 2017-02-06 – 2017-02-07 (×2): 150 mg via ORAL
  Filled 2017-02-05 (×2): qty 1

## 2017-02-05 MED ORDER — KETOROLAC TROMETHAMINE 30 MG/ML IJ SOLN: 30.0000 mg | Freq: Once | INTRAMUSCULAR | Status: DC | PRN

## 2017-02-05 MED ORDER — METHOCARBAMOL 1000 MG/10ML IJ SOLN
500.0000 mg | Freq: Four times a day (QID) | INTRAVENOUS | Status: DC | PRN
  Administered 2017-02-05: 500 mg via INTRAVENOUS
  Filled 2017-02-05: qty 550

## 2017-02-05 MED ORDER — METOCLOPRAMIDE HCL 5 MG PO TABS
5.0000 mg | ORAL_TABLET | Freq: Three times a day (TID) | ORAL | Status: DC | PRN
  Administered 2017-02-05: 10 mg via ORAL
  Filled 2017-02-05: qty 2

## 2017-02-05 MED ORDER — DIPHENHYDRAMINE HCL 12.5 MG/5ML PO ELIX
25.0000 mg | ORAL_SOLUTION | Freq: Four times a day (QID) | ORAL | Status: DC | PRN
  Administered 2017-02-05 – 2017-02-07 (×4): 25 mg via ORAL
  Filled 2017-02-05 (×4): qty 10

## 2017-02-05 MED ORDER — VANCOMYCIN HCL IN DEXTROSE 1-5 GM/200ML-% IV SOLN
INTRAVENOUS | Status: AC
  Filled 2017-02-05: qty 200

## 2017-02-05 MED ORDER — TIOTROPIUM BROMIDE MONOHYDRATE 18 MCG IN CAPS
18.0000 ug | ORAL_CAPSULE | Freq: Every day | RESPIRATORY_TRACT | Status: DC
  Administered 2017-02-06 – 2017-02-07 (×2): 18 ug via RESPIRATORY_TRACT
  Filled 2017-02-05: qty 5

## 2017-02-05 MED ORDER — VANCOMYCIN HCL IN DEXTROSE 1-5 GM/200ML-% IV SOLN
1000.0000 mg | INTRAVENOUS | Status: AC
  Administered 2017-02-05: 1000 mg via INTRAVENOUS

## 2017-02-05 MED ORDER — MIDAZOLAM HCL 2 MG/2ML IJ SOLN
INTRAMUSCULAR | Status: AC
Start: 2017-02-05 — End: 2017-02-05
  Filled 2017-02-05: qty 2

## 2017-02-05 MED ORDER — PROPOFOL 10 MG/ML IV BOLUS
INTRAVENOUS | Status: DC | PRN
  Administered 2017-02-05: 150 mg via INTRAVENOUS

## 2017-02-05 MED ORDER — OXYCODONE HCL 5 MG PO TABS
5.0000 mg | ORAL_TABLET | ORAL | Status: DC | PRN
  Administered 2017-02-05: 5 mg via ORAL
  Administered 2017-02-05: 15 mg via ORAL
  Administered 2017-02-05: 10 mg via ORAL
  Administered 2017-02-05 – 2017-02-07 (×11): 15 mg via ORAL
  Filled 2017-02-05 (×4): qty 3
  Filled 2017-02-05: qty 1
  Filled 2017-02-05 (×3): qty 3
  Filled 2017-02-05: qty 2
  Filled 2017-02-05 (×5): qty 3

## 2017-02-05 MED ORDER — MONTELUKAST SODIUM 10 MG PO TABS
10.0000 mg | ORAL_TABLET | Freq: Every day | ORAL | Status: DC
  Administered 2017-02-05 – 2017-02-06 (×2): 10 mg via ORAL
  Filled 2017-02-05 (×2): qty 1

## 2017-02-05 MED ORDER — LACTATED RINGERS IV SOLN
INTRAVENOUS | Status: DC
  Administered 2017-02-05 (×2): via INTRAVENOUS

## 2017-02-05 SURGICAL SUPPLY — 63 items
BAG ZIPLOCK 12X15 (MISCELLANEOUS) ×2 IMPLANT
BLADE SAW SGTL 73X25 THK (BLADE) ×2 IMPLANT
CAPT HIP TOTAL 2 ×2 IMPLANT
CHLORAPREP W/TINT 26ML (MISCELLANEOUS) ×2 IMPLANT
COVER SURGICAL LIGHT HANDLE (MISCELLANEOUS) ×2 IMPLANT
DERMABOND ADVANCED (GAUZE/BANDAGES/DRESSINGS)
DERMABOND ADVANCED .7 DNX12 (GAUZE/BANDAGES/DRESSINGS) IMPLANT
DRAPE INCISE 23X17 IOBAN STRL (DRAPES) ×3
DRAPE INCISE IOBAN 23X17 STRL (DRAPES) ×3 IMPLANT
DRAPE ORTHO SPLIT 77X108 STRL (DRAPES) ×2
DRAPE POUCH INSTRU U-SHP 10X18 (DRAPES) ×2 IMPLANT
DRAPE SURG 17X11 SM STRL (DRAPES) ×2 IMPLANT
DRAPE SURG ORHT 6 SPLT 77X108 (DRAPES) ×2 IMPLANT
DRAPE U-SHAPE 47X51 STRL (DRAPES) ×2 IMPLANT
DRSG ADAPTIC 3X8 NADH LF (GAUZE/BANDAGES/DRESSINGS) IMPLANT
DRSG AQUACEL AG ADV 3.5X10 (GAUZE/BANDAGES/DRESSINGS) IMPLANT
DRSG AQUACEL AG ADV 3.5X14 (GAUZE/BANDAGES/DRESSINGS) ×2 IMPLANT
DRSG TEGADERM 4X4.75 (GAUZE/BANDAGES/DRESSINGS) IMPLANT
ELECT BLADE TIP CTD 4 INCH (ELECTRODE) ×2 IMPLANT
ELECT REM PT RETURN 15FT ADLT (MISCELLANEOUS) ×2 IMPLANT
EVACUATOR 1/8 PVC DRAIN (DRAIN) IMPLANT
FACESHIELD WRAPAROUND (MASK) ×8 IMPLANT
GAUZE SPONGE 2X2 8PLY STRL LF (GAUZE/BANDAGES/DRESSINGS) IMPLANT
GAUZE SPONGE 4X4 12PLY STRL (GAUZE/BANDAGES/DRESSINGS) IMPLANT
GLOVE BIOGEL PI IND STRL 6.5 (GLOVE) ×1 IMPLANT
GLOVE BIOGEL PI IND STRL 7.5 (GLOVE) ×4 IMPLANT
GLOVE BIOGEL PI IND STRL 8.5 (GLOVE) ×1 IMPLANT
GLOVE BIOGEL PI INDICATOR 6.5 (GLOVE) ×1
GLOVE BIOGEL PI INDICATOR 7.5 (GLOVE) ×4
GLOVE BIOGEL PI INDICATOR 8.5 (GLOVE) ×1
GLOVE ECLIPSE 8.0 STRL XLNG CF (GLOVE) ×4 IMPLANT
GLOVE SURG SS PI 6.5 STRL IVOR (GLOVE) ×2 IMPLANT
GLOVE SURG SS PI 7.0 STRL IVOR (GLOVE) ×4 IMPLANT
GOWN STRL REUS W/ TWL XL LVL3 (GOWN DISPOSABLE) ×1 IMPLANT
GOWN STRL REUS W/TWL LRG LVL3 (GOWN DISPOSABLE) ×2 IMPLANT
GOWN STRL REUS W/TWL XL LVL3 (GOWN DISPOSABLE) ×5 IMPLANT
HEMOSTAT SPONGE AVITENE ULTRA (HEMOSTASIS) ×2 IMPLANT
IMMOBILIZER KNEE 20 (SOFTGOODS) ×2
IMMOBILIZER KNEE 20 THIGH 36 (SOFTGOODS) ×1 IMPLANT
KIT BASIN OR (CUSTOM PROCEDURE TRAY) ×2 IMPLANT
MANIFOLD NEPTUNE II (INSTRUMENTS) ×2 IMPLANT
MARKER SKIN DUAL TIP RULER LAB (MISCELLANEOUS) ×2 IMPLANT
NDL SAFETY ECLIPSE 18X1.5 (NEEDLE) ×1 IMPLANT
NEEDLE HYPO 18GX1.5 SHARP (NEEDLE) ×1
PACK TOTAL JOINT (CUSTOM PROCEDURE TRAY) ×2 IMPLANT
POSITIONER SURGICAL ARM (MISCELLANEOUS) ×2 IMPLANT
SPONGE GAUZE 2X2 STER 10/PKG (GAUZE/BANDAGES/DRESSINGS)
SPONGE LAP 18X18 X RAY DECT (DISPOSABLE) IMPLANT
SPONGE LAP 4X18 X RAY DECT (DISPOSABLE) ×2 IMPLANT
STAPLER VISISTAT 35W (STAPLE) IMPLANT
STRIP CLOSURE SKIN 1/2X4 (GAUZE/BANDAGES/DRESSINGS) ×2 IMPLANT
SUCTION FRAZIER HANDLE 10FR (MISCELLANEOUS) ×1
SUCTION TUBE FRAZIER 10FR DISP (MISCELLANEOUS) ×1 IMPLANT
SUT MNCRL AB 4-0 PS2 18 (SUTURE) IMPLANT
SUT VIC AB 1 CT1 27 (SUTURE) ×1
SUT VIC AB 1 CT1 27XBRD ANTBC (SUTURE) ×1 IMPLANT
SUT VIC AB 2-0 CT1 27 (SUTURE) ×3
SUT VIC AB 2-0 CT1 TAPERPNT 27 (SUTURE) ×3 IMPLANT
SUT VLOC 180 0 24IN GS25 (SUTURE) ×2 IMPLANT
SYR 20CC LL (SYRINGE) ×2 IMPLANT
TOWEL OR 17X26 10 PK STRL BLUE (TOWEL DISPOSABLE) ×4 IMPLANT
TRAY FOLEY W/METER SILVER 16FR (SET/KITS/TRAYS/PACK) ×2 IMPLANT
YANKAUER SUCT BULB TIP 10FT TU (MISCELLANEOUS) ×2 IMPLANT

## 2017-02-05 NOTE — Progress Notes (Signed)
Pt c/o itching, no hives or respiratory distress noted, currently receiving IV vancomycin 1000mg . Notified on call provider for [place]. Received a call back from [employer], new orders for diphenhydramine 25mg  PO Q6hrs PRN for itching.

## 2017-02-05 NOTE — Anesthesia Postprocedure Evaluation (Signed)
Anesthesia Post Note  Patient: Joan [name]  Procedure(s) Performed: RIGHT TOTAL HIP ARTHROPLASTY (Right Hip)     Patient location during evaluation: PACU Anesthesia Type: General Level of consciousness: sedated Pain management: pain level controlled Vital Signs Assessment: post-procedure vital signs reviewed and stable Respiratory status: spontaneous breathing Cardiovascular status: stable Postop Assessment: no headache and no apparent nausea or vomiting Anesthetic complications: no    Last Vitals:  Vitals:   02/05/17 1100 02/05/17 1115  BP: (!) 129/91   Pulse: 66 69  Resp: 14 16  Temp:    SpO2: 95% 99%    Last Pain:  Vitals:   02/05/17 1100  TempSrc:   PainSc: Asleep   Pain Goal:                 Talia Hoheisel JR,[name] Chau Savell

## 2017-02-05 NOTE — Interval H&P Note (Signed)
History and Physical Interval Note:  02/05/2017 8:26 AM  Joan [name]  has presented today for surgery, with the diagnosis of Right hip osteoarthritis  The various methods of treatment have been discussed with the patient and family. After consideration of risks, benefits and other options for treatment, the patient has consented to  Procedure(s): RIGHT TOTAL HIP ARTHROPLASTY (Right) as a surgical intervention .  The patient's history has been reviewed, patient examined, no change in status, stable for surgery.  I have reviewed the patient's chart and labs.  Questions were answered to the patient's satisfaction.     [name]

## 2017-02-05 NOTE — Brief Op Note (Signed)
02/05/2017  10:05 AM  PATIENT:  Joan [name]  56 y.o. female  PRE-OPERATIVE DIAGNOSIS:  Right hip osteoarthritis,Obesity  POST-OPERATIVE DIAGNOSIS:  Right hip Primary osteoarthritis and Obesity  PROCEDURE:  Procedure(s): RIGHT TOTAL HIP ARTHROPLASTY (Right)  SURGEON:  Surgeon(s) and Role:    [name], MD - Primary  PHYSICIAN ASSISTANT:Amber Vidette PA   ASSISTANTS: Ardeen Jourdain PA   ANESTHESIA:   general  EBL:  400 mL   BLOOD ADMINISTERED:none  DRAINS: none   LOCAL MEDICATIONS USED:  OTHER 20cc of Exparel mixed with 20cc of Normal Saline  SPECIMEN:  No Specimen  DISPOSITION OF SPECIMEN:  N/A  COUNTS:  YES  TOURNIQUET:  * No tourniquets in log *  DICTATION: .Other Dictation: Dictation Number [id number]  PLAN OF CARE: Admit to inpatient   PATIENT DISPOSITION:  Stable in OR   Delay start of Pharmacological VTE agent (>24hrs) due to surgical blood loss or risk of bleeding: yes

## 2017-02-05 NOTE — Evaluation (Signed)
Physical Therapy Evaluation Patient Details Name: Joan [name] MRN: [id number] DOB: 08/01/1960 Today's Date: 02/05/2017   History of Present Illness  Pt s/p R THR and with hx of DM, COPD, lumbar fusion and bipolar  Clinical Impression  Pt s/p R THR and presents with decreased R LE strength/ROM, post op pain, posterior THP, and PWB status limiting functional mobility.  Pt would benefit from follow up rehab at SNF level to maximize IND and safety prior to return home with ltd assist.    Follow Up Recommendations SNF    Equipment Recommendations  None recommended by PT    Recommendations for Other Services OT consult     Precautions / Restrictions Precautions Precautions: Posterior Hip Precaution Booklet Issued: Yes (comment) Precaution Comments: THP reviewed x 2 Restrictions Weight Bearing Restrictions: Yes RLE Weight Bearing: Partial weight bearing RLE Partial Weight Bearing Percentage or Pounds: 50%      Mobility  Bed Mobility Overal bed mobility: Needs Assistance Bed Mobility: Supine to Sit     Supine to sit: Mod assist;+2 for physical assistance;+2 for safety/equipment     General bed mobility comments: Increased time with cues for sequence and use of L LE to self assist.  Physical assist to manage R LE and to bring trunk to upright  Transfers Overall transfer level: Needs assistance Equipment used: Rolling walker (2 wheeled) Transfers: Sit to/from Stand Sit to Stand: Mod assist;+2 physical assistance;+2 safety/equipment         General transfer comment: cues for LE management, adherence to THP and use of UEs to self assist  Ambulation/Gait Ambulation/Gait assistance: Min assist;Mod assist;+2 physical assistance;+2 safety/equipment Ambulation Distance (Feet): 4 Feet Assistive device: Rolling walker (2 wheeled) Gait Pattern/deviations: Step-to pattern;Decreased step length - right;Decreased step length - left;Shuffle;Trunk flexed Gait velocity: decr Gait  velocity interpretation: Below normal speed for age/gender General Gait Details: cues for sequence, posture, position from RW and ER on R  Stairs            Wheelchair Mobility    Modified Rankin (Stroke Patients Only)       Balance Overall balance assessment: Needs assistance Sitting-balance support: Feet supported;No upper extremity supported Sitting balance-Leahy Scale: Fair     Standing balance support: Bilateral upper extremity supported Standing balance-Leahy Scale: Poor                               Pertinent Vitals/Pain Pain Assessment: 0-10 Pain Score: 6  Pain Location: R hip/groin Pain Descriptors / Indicators: Aching;Sore;Burning Pain Intervention(s): Limited activity within patient's tolerance;Monitored during session;Premedicated before session;Patient requesting pain meds-RN notified;Ice applied    Home Living Family/patient expects to be discharged to:: Skilled nursing facility                      Prior Function Level of Independence: Independent with assistive device(s)         Comments: Furniture walker at home and pushes wc for community ambulation.  Performs own ADLs with increased time     Hand Dominance        Extremity/Trunk Assessment   Upper Extremity Assessment Upper Extremity Assessment: Overall WFL for tasks assessed    Lower Extremity Assessment Lower Extremity Assessment: RLE deficits/detail       Communication   Communication: No difficulties  Cognition Arousal/Alertness: Awake/alert Behavior During Therapy: WFL for tasks assessed/performed Overall Cognitive Status: Within Functional Limits for tasks assessed  General Comments      Exercises Total Joint Exercises Ankle Circles/Pumps: AROM;Both;15 reps;Supine   Assessment/Plan    PT Assessment Patient needs continued PT services  PT Problem List Decreased strength;Decreased range of  motion;Decreased activity tolerance;Decreased balance;Decreased mobility;Decreased knowledge of use of DME;Pain;Obesity;Decreased knowledge of precautions       PT Treatment Interventions DME instruction;Gait training;Stair training;Functional mobility training;Therapeutic activities;Therapeutic exercise;Patient/family education    PT Goals (Current goals can be found in the Care Plan section)  Acute Rehab PT Goals Patient Stated Goal: Regain IND and have the other hip done PT Goal Formulation: With patient Time For Goal Achievement: 02/12/17 Potential to Achieve Goals: Fair    Frequency 7X/week   Barriers to discharge Inaccessible home environment 10 stairs into home    Co-evaluation               AM-PAC PT "6 Clicks" Daily Activity  Outcome Measure Difficulty turning over in bed (including adjusting bedclothes, sheets and blankets)?: Unable Difficulty moving from lying on back to sitting on the side of the bed? : Unable Difficulty sitting down on and standing up from a chair with arms (e.g., wheelchair, bedside commode, etc,.)?: Unable Help needed moving to and from a bed to chair (including a wheelchair)?: A Lot Help needed walking in hospital room?: A Lot Help needed climbing 3-5 steps with a railing? : Total 6 Click Score: 8    End of Session Equipment Utilized During Treatment: Gait belt Activity Tolerance: Patient limited by fatigue;Patient limited by pain Patient left: in chair;with call bell/phone within reach;with nursing/sitter in room Nurse Communication: Mobility status PT Visit Diagnosis: Unsteadiness on feet (R26.81);Difficulty in walking, not elsewhere classified (R26.2);Pain Pain - Right/Left: Right Pain - part of body: Hip    Time: 1718-1750 PT Time Calculation (min) (ACUTE ONLY): 32 min   Charges:   PT Evaluation $PT Eval Low Complexity: 1 Low PT Treatments $Gait Training: 8-22 mins   PT G Codes:        Pg [phone]  [phone]   Jacolyn Joaquin 02/05/2017, 6:10 PM

## 2017-02-05 NOTE — Transfer of Care (Signed)
Immediate Anesthesia Transfer of Care Note  Patient: Joan [name]  Procedure(s) Performed: RIGHT TOTAL HIP ARTHROPLASTY (Right Hip)  Patient Location: PACU  Anesthesia Type:General  Level of Consciousness: sedated  Airway & Oxygen Therapy: Patient Spontanous Breathing and Patient connected to face mask oxygen  Post-op Assessment: Report given to RN and Post -op Vital signs reviewed and stable  Post vital signs: Reviewed and stable  Last Vitals:  Vitals:   02/05/17 0634  BP: 119/76  Pulse: 75  Resp: 16  Temp: 36.6 C  SpO2: 95%    Last Pain:  Vitals:   02/05/17 0655  TempSrc:   PainSc: 7          Complications: No apparent anesthesia complications

## 2017-02-05 NOTE — Anesthesia Procedure Notes (Signed)
Procedure Name: Intubation Date/Time: 02/05/2017 8:38 AM Performed by: Lind Covert, CRNA Pre-anesthesia Checklist: Patient identified, Emergency Drugs available, Suction available, Patient being monitored and Timeout performed Patient Re-evaluated:Patient Re-evaluated prior to induction Oxygen Delivery Method: Circle system utilized Preoxygenation: Pre-oxygenation with 100% oxygen Induction Type: IV induction Laryngoscope Size: Mac and 4 Grade View: Grade I Tube type: Oral Tube size: 7.5 mm Number of attempts: 1 Airway Equipment and Method: Stylet Placement Confirmation: ETT inserted through vocal cords under direct vision,  positive ETCO2 and breath sounds checked- equal and bilateral Secured at: 22 cm Tube secured with: Tape Dental Injury: Teeth and Oropharynx as per pre-operative assessment

## 2017-02-06 ENCOUNTER — Inpatient Hospital Stay (HOSPITAL_COMMUNITY): Payer: Medicare HMO

## 2017-02-06 ENCOUNTER — Encounter (HOSPITAL_COMMUNITY): Payer: Self-pay | Admitting: Orthopedic Surgery

## 2017-02-06 LAB — CBC
HCT: 37.8 % (ref 36.0–46.0)
Hemoglobin: 12.9 g/dL (ref 12.0–15.0)
MCH: 32.3 pg (ref 26.0–34.0)
MCHC: 34.1 g/dL (ref 30.0–36.0)
MCV: 94.5 fL (ref 78.0–100.0)
Platelets: 208 10*3/uL (ref 150–400)
RBC: 4 MIL/uL (ref 3.87–5.11)
RDW: 13.1 % (ref 11.5–15.5)
WBC: 12.8 10*3/uL — ABNORMAL HIGH (ref 4.0–10.5)

## 2017-02-06 LAB — BASIC METABOLIC PANEL
Anion gap: 10 (ref 5–15)
BUN: 14 mg/dL (ref 6–20)
CO2: 28 mmol/L (ref 22–32)
Calcium: 8.9 mg/dL (ref 8.9–10.3)
Chloride: 98 mmol/L — ABNORMAL LOW (ref 101–111)
Creatinine, Ser: 0.89 mg/dL (ref 0.44–1.00)
GFR calc Af Amer: >60 mL/min (ref >60)
GFR calc non Af Amer: >60 mL/min (ref >60)
Glucose, Bld: 165 mg/dL — ABNORMAL HIGH (ref 65–99)
Potassium: 3.5 mmol/L (ref 3.5–5.1)
Sodium: 136 mmol/L (ref 135–145)

## 2017-02-06 MED ORDER — DIPHENHYDRAMINE HCL 25 MG PO CAPS
50.0000 mg | ORAL_CAPSULE | Freq: Once | ORAL | Status: AC
Start: 2017-02-06 — End: 2017-02-06
  Administered 2017-02-06: 50 mg via ORAL
  Filled 2017-02-06: qty 2

## 2017-02-06 MED ORDER — HYDROCORTISONE 1 % EX CREA
TOPICAL_CREAM | Freq: Four times a day (QID) | CUTANEOUS | Status: DC | PRN
  Filled 2017-02-06: qty 28

## 2017-02-06 NOTE — Progress Notes (Signed)
PT Cancellation Note  Patient Details Name: Joan [name] MRN: [id number] DOB: 1960-09-29   Cancelled Treatment:      per RN, "MD does not want pt up today".  There is a bed rest order but also MD PN states "pt may get out of bed after I see the X ray".   Rica Koyanagi  PTA [facility]  Acute  Rehab Pager      [phone]

## 2017-02-06 NOTE — Op Note (Signed)
NAMETEIONA, SOLIVAN                 ACCOUNT NO.:  [id number]  MEDICAL RECORD NO.:  [id number]  LOCATION:                                 FACILITY:  PHYSICIAN:  Georges Lynch. Fredrika Canby, M.D.DATE OF BIRTH:  [birth year]  DATE OF PROCEDURE:  02/05/2017 DATE OF DISCHARGE:                              OPERATIVE REPORT   SURGEON:  Georges Lynch. Darrelyn Hillock, M.D.  ASSISTANT:  Dimitri Ped, [place].  PREOPERATIVE DIAGNOSES: 1. Severe primary osteoarthritis of the right hip. 2. Leg length discrepancy, she is about 0.5 inch short on the right     compared to the left. 3. Obesity.  POSTOPERATIVE DIAGNOSES: 1. Severe primary osteoarthritis of the right hip. 2. Leg length discrepancy, she is about 0.5 inch short on the right     compared to the left. 3. Obesity.  OPERATION:  Right total hip arthroplasty utilizing the DePuy system. The stem size was a size 4 high offset Tri-Lock stem.  The cup was a size 48 Gription cup.  One screw was used 25 mm in length and also the hole eliminator was used.  The insert was a size 32 mm diameter insert polyethylene cup.  The ball size was a +5 ceramic, size 32 mm diameter.  DESCRIPTION OF PROCEDURE:  The appropriate time-out was first carried out, also marked the appropriate right hip in the holding area.  At this time with the patient on the left side, right side up, the initial prep and draping were carried out.  As I mentioned, a time-out was carried out.  She had 1 g of IV Ancef.  All precautions were taken because she had a nasal swab that showed MRSA.  She was given 1 g of vancomycin for that reason.  A posterolateral approach to the hip was carried out. Bleeders were identified and cauterized.  Self-retaining retractors were inserted.  At this time, I then went down, identified the greater trochanter.  I excised the greater trochanteric bursa.  I then incised the iliotibial band and partially detached the external rotators.  I then went down with the  self-retaining retractors, protected the sciatic nerve, and basically protected it with the sponge in the hand in order to avoid any retraction.  I then incised the capsule, dislocated the hip, and amputated the femoral head which was extremely arthritic.  I amputated the femoral head at the appropriate neck length.  This particular time, I utilized a box osteotome, removed the cancellous bone from the greater trochanteric area.  Then, I utilized the widening reamer and then a canal finder was inserted down the canal.  The canal was narrow.  Following that, I began rasping all the way up to a size 4. Following that, we irrigated out the canal, packed the canal with a large sponge which was later removed.  We then directed our attention to the acetabulum.  I completed the capsulectomy at this time.  We then reamed the acetabulum up to a size 47 for a 48 mm diameter cup.  The Gription cup was inserted in usual fashion.  One screw was used for fixation.  Screw length was 25 mm in length.  I also used  a hole eliminator.  At this time, I then inserted my polyethylene cup 32 mm inside diameter.  We had a nice reduction.  We went through trials with first +1 and a +5, and then we finally went to the high offset neck with a +5 and had excellent stability.  We had measured leg lengths, measured fine as well.  At this time, I feel we restored her length.  Also, when the hip was taken through motion, the hip was stable in all planes.  We then removed our trial components, inserted our permanent Tri-Lock stem size 4 high offset.  I then went through trials again.  At this time, I selected a +5 ceramic ball 32 mm diameter.  I cleared the acetabulum and then reduced it and took the hip through motion.  We had excellent stability.  We then irrigated the wound out, inserted some Gelfoam inferiorly and closed the wound layers in the usual fashion.  Sterile dressings were applied.  She will be kept in the  hospital overnight, and if she is fine, we will let her go home following day or the next day.          ______________________________ Georges Lynch. Darrelyn Hillock, M.D.     RAG/MEDQ  D:  02/05/2017  T:  02/06/2017  Job:  [id number]

## 2017-02-06 NOTE — Progress Notes (Signed)
Plan for d/c to SNF, discharge planning per CSW. [phone] 

## 2017-02-06 NOTE — Progress Notes (Signed)
Subjective: 1 Day Post-Op Procedure(s) (LRB): RIGHT TOTAL HIP ARTHROPLASTY (Right) Patient reports pain as 6 on 0-10 scale.She has significant pain in her Right hip. Good function in her foot.She was up in a chair yesterday with no problem. Will get an AP view of her Right Hip now. Leg lengths are fine and no gross deformity of right lower. Her pain level is very difficult to evaluate.    Objective: Vital signs in last 24 hours: Temp:  [97.3 F (36.3 C)-99.4 F (37.4 C)] 98.5 F (36.9 C) (12/13 0441) Pulse Rate:  [65-86] 86 (12/13 0441) Resp:  [11-20] 20 (12/13 0441) BP: (109-144)/(69-93) 144/69 (12/13 0441) SpO2:  [92 %-100 %] 92 % (12/13 0441)  Intake/Output from previous day: 12/12 0701 - 12/13 0700 In: 3131.7 [P.O.:960; I.V.:1916.7; IV Piggyback:255] Out: 3950 [Urine:3550; Blood:400] Intake/Output this shift: No intake/output data recorded.  Recent Labs    02/03/17 1201 02/06/17 0601  HGB 14.0 12.9   Recent Labs    02/03/17 1201 02/06/17 0601  WBC 12.2* 12.8*  RBC 4.34 4.00  HCT 40.9 37.8  PLT 259 208   Recent Labs    02/03/17 1201 02/06/17 0601  NA 138 136  K 4.2 3.5  CL 102 98*  CO2 27 28  BUN 20 14  CREATININE 0.99 0.89  GLUCOSE 132* 165*  CALCIUM 9.5 8.9   Recent Labs    02/03/17 1201  INR 0.94    Dorsiflexion/Plantar flexion intact  Assessment/Plan: 1 Day Post-Op Procedure(s) (LRB): RIGHT TOTAL HIP ARTHROPLASTY (Right) Up with therapy after I see her Xray.  [name] 02/06/2017, 7:30 AM

## 2017-02-06 NOTE — Clinical Social Work Note (Signed)
Clinical Social Work Assessment  Patient Details  Name: Joan [name] MRN: [id number] Date of Birth: 03-24-1960  Date of referral:  02/06/17               Reason for consult:  Facility Placement                Permission sought to share information with:  Facility Sport and exercise psychologist, Family Supports Permission granted to share information::  Yes, Verbal Permission Granted  Name::        Agency::  SNF  Relationship::  Brother-[name]   Contact Information:     Housing/Transportation Living arrangements for the past 2 months:  Single Family Home Source of Information:  Patient, Other (Comment Required)(Brother) Patient Interpreter Needed:  None Criminal Activity/Legal Involvement Pertinent to Current Situation/Hospitalization:  No - Comment as needed Significant Relationships:  Siblings Lives with:  Siblings Do you feel safe going back to the place where you live?  Yes Need for family participation in patient care:  Yes (Dependent with mobility)  Care giving concerns:  Patient has history of pain and functional disability in the right hip due to arthritis:Patient admitted for right total hip arthroplasty surgical intervention.    Social Worker assessment / plan: CSW met with patient at bedside, explain role and reason for visit. Patient reports recently moved from [place] to [place] to live with her brother. Patient reports she is familiar with skilled care at nursing home because she has been to a few in [place]. Patient preferred her brother to help decide on placement.   CSW called pt. Brother Joan [name] and provided list of offers. Patient brother not familiar with facilities but request a facility in network and that will pay for her stay at SNF because patient is unable to pay privately or large co-pay for out of network benefits.  CSW provided options, brother chooses [facility]. CSW followed up with the liaison. She reports they will start the authorization  process.    Plan: Assist with D/C to SNF.    Employment status:  Disabled (Comment on whether or not currently receiving Disability) Insurance information:  Managed Medicare PT Recommendations:  [place] / Referral to community resources:  [place]  Patient/Family's Response to care:  Patient and brother both agreeable to SNF placement. Patient brother reports he is unable to take care of the patient at home. He reports they are currently in the process of moving into a different home.   Patient/Family's Understanding of and Emotional Response to Diagnosis, Current Treatment, and Prognosis:  "I moved her here from [place] because she could not care for herself. "He reports he is now her primary caretaker.   Emotional Assessment Appearance:  Developmentally appropriate Attitude/Demeanor/Rapport:    Affect (typically observed):  Accepting Orientation:  Oriented to Self, Oriented to Place, Oriented to  Time, Oriented to Situation Alcohol / Substance use:  Not Applicable Psych involvement (Current and /or in the community):  No (Comment)  Discharge Needs  Concerns to be addressed:  Discharge Planning Concerns Readmission within the last 30 days:  No Current discharge risk:  Dependent with Mobility Barriers to Discharge:  Continued Medical Work up, [place], [place] 02/06/2017, 12:24 PM

## 2017-02-06 NOTE — Progress Notes (Signed)
OT Cancellation Note  Patient Details Name: Eleisha Branscomb MRN: [id number] DOB: 31-May-1960   Cancelled Treatment:    Reason Eval/Treat Not Completed: Medical issues which prohibited therapy. Reviewed chart and spoke to RN. Patient is currently on bedrest; RN confirmed this. Will follow up for OT evaluation tomorrow.  Taniya Dasher A Zuzu Befort 02/06/2017, 11:45 AM

## 2017-02-06 NOTE — NC FL2 (Signed)
[place] LEVEL OF CARE SCREENING TOOL     IDENTIFICATION  Patient Name: Joan [name] Birthdate: 1960-09-20 Sex: female Admission Date (Current Location): 02/05/2017  [facility] and [place] Number:  Herbalist and Address:  [facility],  [place] [address], [place]      Provider Number: [id number]  Attending Physician Name and Address:  [name], MD  Relative Name and Phone Number:       Current Level of Care: Hospital Recommended Level of Care: [facility] Prior Approval Number:    Date Approved/Denied:   PASRR Number:  Pending  Discharge Plan: SNF    Current Diagnoses: Patient Active Problem List   Diagnosis Date Noted  . H/O total hip arthroplasty, right 02/05/2017  . OSA treated with BiPAP 01/14/2017  . Rash 01/08/2017  . Pre-operative clearance 12/16/2016  . Chronic respiratory failure with hypoxia ([place]) 12/16/2016  . Essential hypertension 11/29/2016  . Back pain 11/29/2016  . Vocal cord dysfunction   . COPD, severe ([place]) 09/24/2016  . Tobacco abuse 09/24/2016  . Anxiety 09/24/2016  . Steroid-induced diabetes ([place]) 09/24/2016    Orientation RESPIRATION BLADDER Height & Weight     Self, Time, Situation, Place  O2(2.5L) Continent Weight: 214 lb (97.1 kg) Height:  5\' 7"  (170.2 cm)  BEHAVIORAL SYMPTOMS/MOOD NEUROLOGICAL BOWEL NUTRITION STATUS      Continent Diet(Regular )  AMBULATORY STATUS COMMUNICATION OF NEEDS Skin   Extensive Assist Verbally Normal(Right Hip)                       Personal Care Assistance Level of Assistance  Bathing, Feeding, Dressing Bathing Assistance: Limited assistance Feeding assistance: Independent Dressing Assistance: Limited assistance     Functional Limitations Info  Sight, Hearing, Speech Sight Info: Adequate Hearing Info: Adequate Speech Info: Adequate    SPECIAL CARE FACTORS FREQUENCY  PT (By licensed PT), OT (By licensed OT)     PT Frequency:  7x/week  OT Frequency: 7x/week            Contractures Contractures Info: Not present    Additional Factors Info  Code Status, Allergies Code Status Info: Full Code Allergies Info: Allergies: Dilaudid Hydromorphone Hcl, Keflex Cephalexin, Prednisone, Shellfish Allergy, Tetracyclines & Related           Current Medications (02/06/2017):  This is the current hospital active medication list Current Facility-Administered Medications  Medication Dose Route Frequency Provider Last Rate Last Dose  . acetaminophen (TYLENOL) tablet 650 mg  650 mg Oral Q4H PRN [name], MD   650 mg at 02/06/17 0114   Or  . acetaminophen (TYLENOL) suppository 650 mg  650 mg Rectal Q4H PRN [name], MD      . albuterol (PROVENTIL) (2.5 MG/3ML) 0.083% nebulizer solution 2.5 mg  2.5 mg Nebulization Q6H PRN [name], MD      . alum & mag hydroxide-simeth (MAALOX/MYLANTA) 200-200-20 MG/5ML suspension 30 mL  30 mL Oral Q4H PRN [name], MD      . atenolol (TENORMIN) tablet 25 mg  25 mg Oral Daily [name], MD   25 mg at 02/06/17 1660  . bisacodyl (DULCOLAX) EC tablet 5 mg  5 mg Oral Daily PRN [name], MD      . bupivacaine liposome (EXPAREL) 1.3 % injection 266 mg  20 mL Infiltration Once Constable, [employer], PA-C      . diphenhydrAMINE (BENADRYL) 12.5 MG/5ML elixir 25 mg  25 mg Oral Q6H  PRN Ardeen Jourdain, PA-C   25 mg at 02/06/17 0530  . ferrous sulfate tablet 325 mg  325 mg Oral TID Penelope Galas, MD   325 mg at 02/06/17 3329  . HYDROcodone-acetaminophen (NORCO/VICODIN) 5-325 MG per tablet 1 tablet  1 tablet Oral Q4H PRN [name], MD      . lactated ringers infusion   Intravenous Continuous [name], MD 100 mL/hr at 02/06/17 0757    . levothyroxine (SYNTHROID, LEVOTHROID) tablet 25 mcg  25 mcg Oral QAC breakfast [name], MD   25 mcg at 02/06/17 0755  . menthol-cetylpyridinium (CEPACOL) lozenge 3 mg  1 lozenge Oral PRN [name], MD        Or  . phenol (CHLORASEPTIC) mouth spray 1 spray  1 spray Mouth/Throat PRN [name], MD      . methocarbamol (ROBAXIN) tablet 500 mg  500 mg Oral Q6H PRN [name], MD   500 mg at 02/05/17 2325   Or  . methocarbamol (ROBAXIN) 500 mg in dextrose 5 % 50 mL IVPB  500 mg Intravenous Q6H PRN [name], MD   Stopped at 02/05/17 1141  . metoCLOPramide (REGLAN) tablet 5-10 mg  5-10 mg Oral Q8H PRN [name], MD   10 mg at 02/05/17 1745   Or  . metoCLOPramide (REGLAN) injection 5-10 mg  5-10 mg Intravenous Q8H PRN [name], MD      . mometasone-formoterol (DULERA) 200-5 MCG/ACT inhaler 2 puff  2 puff Inhalation BID [name], MD   2 puff at 02/06/17 [phone]  . montelukast (SINGULAIR) tablet 10 mg  10 mg Oral QHS [name], MD   10 mg at 02/05/17 2152  . morphine 4 MG/ML injection 1 mg  1 mg Intravenous Q3H PRN Cecilio Asper, Amber, PA-C   1 mg at 02/06/17 0535  . ondansetron (ZOFRAN) tablet 4 mg  4 mg Oral Q6H PRN [name], MD       Or  . ondansetron (ZOFRAN) injection 4 mg  4 mg Intravenous Q6H PRN [name], MD      . oxyCODONE (Oxy IR/ROXICODONE) immediate release tablet 5-15 mg  5-15 mg Oral Q3H PRN [name], MD   15 mg at 02/06/17 0756  . polyethylene glycol (MIRALAX / GLYCOLAX) packet 17 g  17 g Oral Daily PRN [name], MD      . pregabalin (LYRICA) capsule 75 mg  75 mg Oral BID [name], MD   75 mg at 02/06/17 4166  . rivaroxaban (XARELTO) tablet 10 mg  10 mg Oral Q breakfast [name], MD   10 mg at 02/06/17 0755  . roflumilast (DALIRESP) tablet 500 mcg  500 mcg Oral Daily [name], MD   500 mcg at 02/06/17 [phone]  . tiotropium (SPIRIVA) inhalation capsule 18 mcg  18 mcg Inhalation Daily [name], MD   18 mcg at 02/06/17 1601  . valACYclovir (VALTREX) tablet 500 mg  500 mg Oral Daily [name], MD   500 mg at 02/06/17 0932  . varenicline (CHANTIX) tablet 1 mg  1 mg Oral BID [name], MD   1 mg at  02/06/17 3557  . venlafaxine XR (EFFEXOR-XR) 24 hr capsule 150 mg  150 mg Oral Q breakfast [name], MD   150 mg at 02/06/17 3220     Discharge Medications: Please see discharge summary for a list of discharge medications.  Relevant Imaging Results:  Relevant Lab Results:   Additional Information SSN:[id number]  Lia Hopping, LCSW

## 2017-02-07 DIAGNOSIS — R69 Illness, unspecified: Secondary | ICD-10-CM | POA: Diagnosis not present

## 2017-02-07 DIAGNOSIS — F17200 Nicotine dependence, unspecified, uncomplicated: Secondary | ICD-10-CM | POA: Diagnosis not present

## 2017-02-07 DIAGNOSIS — B0081 Herpesviral hepatitis: Secondary | ICD-10-CM | POA: Diagnosis not present

## 2017-02-07 DIAGNOSIS — B001 Herpesviral vesicular dermatitis: Secondary | ICD-10-CM | POA: Diagnosis not present

## 2017-02-07 DIAGNOSIS — E039 Hypothyroidism, unspecified: Secondary | ICD-10-CM | POA: Diagnosis not present

## 2017-02-07 DIAGNOSIS — G4733 Obstructive sleep apnea (adult) (pediatric): Secondary | ICD-10-CM | POA: Diagnosis not present

## 2017-02-07 DIAGNOSIS — E785 Hyperlipidemia, unspecified: Secondary | ICD-10-CM | POA: Diagnosis not present

## 2017-02-07 DIAGNOSIS — R7303 Prediabetes: Secondary | ICD-10-CM | POA: Diagnosis not present

## 2017-02-07 DIAGNOSIS — Z96641 Presence of right artificial hip joint: Secondary | ICD-10-CM | POA: Diagnosis not present

## 2017-02-07 DIAGNOSIS — R2689 Other abnormalities of gait and mobility: Secondary | ICD-10-CM | POA: Diagnosis not present

## 2017-02-07 DIAGNOSIS — J449 Chronic obstructive pulmonary disease, unspecified: Secondary | ICD-10-CM | POA: Diagnosis not present

## 2017-02-07 DIAGNOSIS — I10 Essential (primary) hypertension: Secondary | ICD-10-CM | POA: Diagnosis not present

## 2017-02-07 DIAGNOSIS — Z471 Aftercare following joint replacement surgery: Secondary | ICD-10-CM | POA: Diagnosis not present

## 2017-02-07 DIAGNOSIS — J441 Chronic obstructive pulmonary disease with (acute) exacerbation: Secondary | ICD-10-CM | POA: Diagnosis not present

## 2017-02-07 DIAGNOSIS — K5909 Other constipation: Secondary | ICD-10-CM | POA: Diagnosis not present

## 2017-02-07 DIAGNOSIS — D649 Anemia, unspecified: Secondary | ICD-10-CM | POA: Diagnosis not present

## 2017-02-07 DIAGNOSIS — J9611 Chronic respiratory failure with hypoxia: Secondary | ICD-10-CM | POA: Diagnosis not present

## 2017-02-07 DIAGNOSIS — D508 Other iron deficiency anemias: Secondary | ICD-10-CM | POA: Diagnosis not present

## 2017-02-07 DIAGNOSIS — M150 Primary generalized (osteo)arthritis: Secondary | ICD-10-CM | POA: Diagnosis not present

## 2017-02-07 DIAGNOSIS — Z96643 Presence of artificial hip joint, bilateral: Secondary | ICD-10-CM | POA: Diagnosis not present

## 2017-02-07 DIAGNOSIS — K219 Gastro-esophageal reflux disease without esophagitis: Secondary | ICD-10-CM | POA: Diagnosis not present

## 2017-02-07 DIAGNOSIS — S79911A Unspecified injury of right hip, initial encounter: Secondary | ICD-10-CM | POA: Diagnosis not present

## 2017-02-07 DIAGNOSIS — E034 Atrophy of thyroid (acquired): Secondary | ICD-10-CM | POA: Diagnosis not present

## 2017-02-07 DIAGNOSIS — T7840XA Allergy, unspecified, initial encounter: Secondary | ICD-10-CM | POA: Diagnosis not present

## 2017-02-07 DIAGNOSIS — M62838 Other muscle spasm: Secondary | ICD-10-CM | POA: Diagnosis not present

## 2017-02-07 DIAGNOSIS — M79604 Pain in right leg: Secondary | ICD-10-CM | POA: Diagnosis not present

## 2017-02-07 LAB — CBC
HCT: 34.9 % — ABNORMAL LOW (ref 36.0–46.0)
Hemoglobin: 11.6 g/dL — ABNORMAL LOW (ref 12.0–15.0)
MCH: 31.8 pg (ref 26.0–34.0)
MCHC: 33.2 g/dL (ref 30.0–36.0)
MCV: 95.6 fL (ref 78.0–100.0)
Platelets: 223 10*3/uL (ref 150–400)
RBC: 3.65 MIL/uL — ABNORMAL LOW (ref 3.87–5.11)
RDW: 13.3 % (ref 11.5–15.5)
WBC: 16.3 10*3/uL — ABNORMAL HIGH (ref 4.0–10.5)

## 2017-02-07 LAB — BASIC METABOLIC PANEL
Anion gap: 10 (ref 5–15)
BUN: 8 mg/dL (ref 6–20)
CO2: 26 mmol/L (ref 22–32)
Calcium: 8.5 mg/dL — ABNORMAL LOW (ref 8.9–10.3)
Chloride: 98 mmol/L — ABNORMAL LOW (ref 101–111)
Creatinine, Ser: 0.75 mg/dL (ref 0.44–1.00)
GFR calc Af Amer: >60 mL/min (ref >60)
GFR calc non Af Amer: >60 mL/min (ref >60)
Glucose, Bld: 171 mg/dL — ABNORMAL HIGH (ref 65–99)
Potassium: 4 mmol/L (ref 3.5–5.1)
Sodium: 134 mmol/L — ABNORMAL LOW (ref 135–145)

## 2017-02-07 MED ORDER — ASPIRIN EC 325 MG PO TBEC: 325.0000 mg | DELAYED_RELEASE_TABLET | Freq: Two times a day (BID) | ORAL | 0 refills | Status: DC

## 2017-02-07 MED ORDER — METHOCARBAMOL 500 MG PO TABS: 500.0000 mg | ORAL_TABLET | Freq: Four times a day (QID) | ORAL | 0 refills | Status: DC | PRN

## 2017-02-07 MED ORDER — POLYETHYLENE GLYCOL 3350 17 G PO PACK: 17.0000 g | PACK | Freq: Every day | ORAL | 0 refills | Status: DC | PRN

## 2017-02-07 MED ORDER — OXYCODONE HCL 5 MG PO TABS: 5.0000 mg | ORAL_TABLET | ORAL | 0 refills | Status: DC | PRN

## 2017-02-07 MED ORDER — DOCUSATE SODIUM 100 MG PO CAPS: 100.0000 mg | ORAL_CAPSULE | Freq: Two times a day (BID) | ORAL | 0 refills | Status: DC

## 2017-02-07 MED ORDER — FERROUS SULFATE 325 (65 FE) MG PO TABS: 325.0000 mg | ORAL_TABLET | Freq: Two times a day (BID) | ORAL | 0 refills | Status: DC

## 2017-02-07 MED ORDER — DOCUSATE SODIUM 100 MG PO CAPS
100.0000 mg | ORAL_CAPSULE | Freq: Two times a day (BID) | ORAL | Status: DC
  Administered 2017-02-07: 100 mg via ORAL
  Filled 2017-02-07: qty 1

## 2017-02-07 NOTE — Progress Notes (Signed)
Report was given to [facility].

## 2017-02-07 NOTE — Progress Notes (Signed)
   Subjective: 2 Days Post-Op Procedure(s) (LRB): RIGHT TOTAL HIP ARTHROPLASTY (Right) Patient reports pain as moderate.   Patient seen in rounds for Dr. Gladstone Lighter. Patient is well, and has had no acute complaints or problems other than pain in her right hip. She denies chest pain. Has some mild SOB but is awaiting respiratory therapy for treatment. Positive flatus. Reports concerns about walking.  Plan is to go Skilled nursing facility after hospital stay.  Objective: Vital signs in last 24 hours: Temp:  [99 F (37.2 C)] 99 F (37.2 C) (12/13 2138) Pulse Rate:  [85-97] 97 (12/13 1454) Resp:  [18-20] 18 (12/13 2138) BP: (120-134)/(60-68) 120/60 (12/13 2138) SpO2:  [93 %-100 %] 99 % (12/13 2138)  Intake/Output from previous day:  Intake/Output Summary (Last 24 hours) at 02/07/2017 0713 Last data filed at 02/07/2017 0500 Gross per 24 hour  Intake 3440 ml  Output 2000 ml  Net 1440 ml     Labs: Recent Labs    02/06/17 0601 02/07/17 0609  HGB 12.9 11.6*   Recent Labs    02/06/17 0601 02/07/17 0609  WBC 12.8* 16.3*  RBC 4.00 3.65*  HCT 37.8 34.9*  PLT 208 223   Recent Labs    02/06/17 0601 02/07/17 0609  NA 136 134*  K 3.5 4.0  CL 98* 98*  CO2 28 26  BUN 14 8  CREATININE 0.89 0.75  GLUCOSE 165* 171*  CALCIUM 8.9 8.5*    EXAM General - Patient is Alert and Oriented Extremity - Neurologically intact Intact pulses distally Dorsiflexion/Plantar flexion intact No cellulitis present Compartment soft Dressing/Incision - clean, dry, no drainage Motor Function - intact, moving foot and toes well on exam.   Past Medical History:  Diagnosis Date  . Alcoholism and drug addiction in family   . Anxiety   . Arthritis    "knees, hips, lower back" (09/25/2016)  . Asthma   . Bipolar disorder ([place])   . Chronic bronchitis ([place])    "all the time" (09/25/2016)  . Chronic leg pain    RLE  . Chronic lower back pain   . COPD (chronic obstructive pulmonary disease) ([place])     . Depression   . Emphysema of lung ([place])   . Hepatitis C    "treated back in 2001-2002"  . High cholesterol   . Hypertension   . Hypothyroidism   . OSA on CPAP   . Pneumonia    "all the time" (09/25/2016)  . Thyroid disease   . Type II diabetes mellitus ([facility])     Assessment/Plan: 2 Days Post-Op Procedure(s) (LRB): RIGHT TOTAL HIP ARTHROPLASTY (Right) Active Problems:   H/O total hip arthroplasty, right  Estimated body mass index is 33.52 kg/m as calculated from the following:   Height as of this encounter: 5\' 7"  (1.702 m).   Weight as of this encounter: 97.1 kg (214 lb). Advance diet Up with therapy Discharge to SNF when stable and when placement is made  DVT Prophylaxis - Xarelto PWB right leg   She will get up with therapy today. Discussed the importance of her getting up with therapy. Continue PWB 50% right LE. Receiving neb treatment during rounds. Foley to be removed now that she is off bed rest. Plan for DC to SNF Friday afternoon vs Saturday depending on placement and patient progress.   Ardeen Jourdain, PA-C Orthopaedic Surgery 02/07/2017, 7:13 AM

## 2017-02-07 NOTE — Evaluation (Signed)
Occupational Therapy Evaluation Patient Details Name: Joan [name] MRN: [id number] DOB: 04/28/1960 Today's Date: 02/07/2017    History of Present Illness Pt s/p R THR and with hx of DM, COPD, lumbar fusion and bipolar   Clinical Impression   This 56 year old female was admitted for the above. She was independent with adls prior to admission. She currently has posterior THPs and is PWB. She will benefit from continued OT to further educate on precautions and increase independence with adls.  Goals in acute are for min A. She currently needs mod +2 assist for safety    Follow Up Recommendations  SNF    Equipment Recommendations  3 in 1 bedside commode    Recommendations for Other Services       Precautions / Restrictions Precautions Precautions: Posterior Hip Precaution Booklet Issued: Yes (comment) Precaution Comments: THP reviewed Restrictions Weight Bearing Restrictions: Yes RLE Weight Bearing: Partial weight bearing RLE Partial Weight Bearing Percentage or Pounds: 50%      Mobility Bed Mobility               General bed mobility comments: to EOB by NT  Transfers   Equipment used: Rolling walker (2 wheeled)   Sit to Stand: Mod assist;+2 safety/equipment         General transfer comment: cues for UE/LE placement and for THPs    Balance                                           ADL either performed or assessed with clinical judgement   ADL Overall ADL's : Needs assistance/impaired Eating/Feeding: Independent   Grooming: Set up;Oral care;Sitting   Upper Body Bathing: Set up;Sitting   Lower Body Bathing: Moderate assistance;Sit to/from stand;+2 for safety/equipment   Upper Body Dressing : Set up;Sitting   Lower Body Dressing: Maximal assistance;Sit to/from stand;+2 for safety/equipment   Toilet Transfer: Moderate assistance;+2 for safety/equipment;Stand-pivot;RW(chair)   Toileting- Clothing Manipulation and Hygiene: Moderate  assistance;Sit to/from stand;+2 for safety/equipment         General ADL Comments: performed ADL from EOB and transferred to chair. Cues for THPs. Did not use AE on this visit, but educated on use of reacher and long sponge     Vision         Perception     Praxis      Pertinent Vitals/Pain Pain Assessment: Faces Faces Pain Scale: Hurts little more Pain Location: R hip/groin Pain Descriptors / Indicators: Aching;Sore;Burning Pain Intervention(s): Limited activity within patient's tolerance;Monitored during session;Premedicated before session;Repositioned     Hand Dominance     Extremity/Trunk Assessment Upper Extremity Assessment Upper Extremity Assessment: Overall WFL for tasks assessed           Communication Communication Communication: No difficulties   Cognition Arousal/Alertness: Awake/alert Behavior During Therapy: WFL for tasks assessed/performed Overall Cognitive Status: Within Functional Limits for tasks assessed. Cues for safety and THPs.                                       General Comments  dyspnea 2/4 during adls; rest breaks encouraged    Exercises     Shoulder Instructions      Home Living Family/patient expects to be discharged to:: Skilled nursing facility Living Arrangements: Other relatives  Prior Functioning/Environment Level of Independence: Independent with assistive device(s)                 OT Problem List: Decreased activity tolerance;Impaired balance (sitting and/or standing);Decreased knowledge of use of DME or AE;Decreased knowledge of precautions;Pain      OT Treatment/Interventions: Self-care/ADL training;DME and/or AE instruction;Patient/family education;Balance training;Energy conservation    OT Goals(Current goals can be found in the care plan section) Acute Rehab OT Goals Patient Stated Goal: Regain IND and have the other hip done OT Goal  Formulation: With patient Time For Goal Achievement: 02/21/17 Potential to Achieve Goals: Good ADL Goals Pt Will Perform Lower Body Bathing: with min assist;with adaptive equipment;sit to/from stand Pt Will Perform Lower Body Dressing: with mod assist;sit to/from stand;with adaptive equipment Pt Will Transfer to Toilet: with min assist;ambulating;bedside commode Pt Will Perform Toileting - Clothing Manipulation and hygiene: with min assist;sit to/from stand  OT Frequency: Min 2X/week   Barriers to D/C:            Co-evaluation              AM-PAC PT "6 Clicks" Daily Activity     Outcome Measure Help from another person eating meals?: None Help from another person taking care of personal grooming?: A Little Help from another person toileting, which includes using toliet, bedpan, or urinal?: A Lot Help from another person bathing (including washing, rinsing, drying)?: A Lot Help from another person to put on and taking off regular upper body clothing?: A Little Help from another person to put on and taking off regular lower body clothing?: A Lot 6 Click Score: 16   End of Session    Activity Tolerance: Patient tolerated treatment well Patient left: in chair;with call bell/phone within reach;with nursing/sitter in room  OT Visit Diagnosis: Pain Pain - Right/Left: Right Pain - part of body: Hip                Time: 5284-1324 OT Time Calculation (min): 24 min Charges:  OT General Charges $OT Visit: 1 Visit OT Evaluation $OT Eval Low Complexity: 1 Low OT Treatments $Self Care/Home Management : 8-22 mins G-Codes:     [place], OTR/L [phone] 02/07/2017  Quanna Wittke 02/07/2017, 9:19 AM

## 2017-02-07 NOTE — Clinical Social Work Placement (Addendum)
D/C Summary sent.  Nurse given number for report.   CLINICAL SOCIAL WORK PLACEMENT  NOTE  Date:  02/07/2017  Patient Details  Name: Joan [name] MRN: [id number] Date of Birth: 1960-12-10  Clinical Social Work is seeking post-discharge placement for this patient at the [place] level of care (*CSW will initial, date and re-position this form in  chart as items are completed):  Yes   Patient/family provided with [facility] Work Department's list of facilities offering this level of care within the geographic area requested by the patient (or if unable, by the patient's family).  Yes   Patient/family informed of their freedom to choose among providers that offer the needed level of care, that participate in Medicare, Medicaid or managed care program needed by the patient, have an available bed and are willing to accept the patient.  Yes   Patient/family informed of [facility]'s ownership interest in [facility] and [facility], as well as of the fact that they are under no obligation to receive care at these facilities.  PASRR submitted to EDS on 02/06/17     PASRR number received on 02/06/17     Existing PASRR number confirmed on       FL2 transmitted to all facilities in geographic area requested by pt/family on       FL2 transmitted to all facilities within larger geographic area on 02/06/17     Patient informed that his/her managed care company has contracts with or will negotiate with certain facilities, including the following:        Yes   Patient/family informed of bed offers received.  Patient chooses bed at     [facility]  Physician recommends and patient chooses bed at    [facility]   Patient to be transferred to   on 02/07/17.  Patient to be transferred to facility by [facility]      Patient family notified on 02/07/17 of transfer.  Name of family member notified:    Brother [name], he will take  patient Bipap machine to the facility.  CSW provided him w. Facility contact information.   PHYSICIAN       Additional Comment:    _______________________________________________ Lia Hopping, LCSW 02/07/2017, 1:19 PM

## 2017-02-07 NOTE — Progress Notes (Signed)
Physical Therapy Treatment Patient Details Name: Joan [name] MRN: [id number] DOB: August 05, 1960 Today's Date: 02/07/2017    History of Present Illness Pt s/p R THR and with hx of DM, COPD, lumbar fusion and bipolar    PT Comments    Pt progressing slowly with difficulty in mobility adhering to her PWB  (weak B UE's) and THP pt requires repeat VC's.  Pt will need ST Rehab at SNF prior to safely returning home.   Follow Up Recommendations  SNF     Equipment Recommendations  None recommended by PT    Recommendations for Other Services       Precautions / Restrictions Precautions Precautions: Posterior Hip Precaution Booklet Issued: Yes (comment) Precaution Comments: THP reviewed x 2 Restrictions Weight Bearing Restrictions: Yes RLE Weight Bearing: Partial weight bearing RLE Partial Weight Bearing Percentage or Pounds: 50%    Mobility  Bed Mobility               General bed mobility comments: OOB in recliner via OT  Transfers Overall transfer level: Needs assistance Equipment used: Rolling walker (2 wheeled) Transfers: Sit to/from [facility] Sit to Stand: Mod assist;+2 safety/equipment Stand pivot transfers: Mod assist       General transfer comment: several attempts and + 2 assist with 75% VC's to adhere to THP as pt tends to rotate inward  Ambulation/Gait Ambulation/Gait assistance: Min assist;Mod assist;+2 physical assistance;+2 safety/equipment Ambulation Distance (Feet): 2 Feet Assistive device: Rolling walker (2 wheeled) Gait Pattern/deviations: Step-to pattern;Decreased step length - right;Decreased step length - left;Shuffle;Trunk flexed     General Gait Details: cues for sequence, posture, position from RW and ER on R      very limited distance due to difficulty adhering to [facility] as pt c/o weak B UE's and noted 3/4 DOE.   RA decreased from 94% at rest to 80%.  Ceased activity and applied 2 lts nasal.  Also hr increased to 138.      Stairs            Wheelchair Mobility    Modified Rankin (Stroke Patients Only)       Balance                                            Cognition Arousal/Alertness: Awake/alert Behavior During Therapy: WFL for tasks assessed/performed Overall Cognitive Status: Within Functional Limits for tasks assessed                                 General Comments: some anxiety with activity      Exercises  B LE AP 10 reps  B LE LAQ's 10 reps    General Comments General comments (skin integrity, edema, etc.): dyspnea 2/4 during adls; rest breaks encouraged      Pertinent Vitals/Pain Pain Assessment: 0-10 Pain Score: 10-Worst pain ever Faces Pain Scale: Hurts little more Pain Location: R hip/groin Pain Descriptors / Indicators: Aching;Sore;Burning;Operative site guarding Pain Intervention(s): Monitored during session;Repositioned;Ice applied    Home Living Family/patient expects to be discharged to:: Skilled nursing facility Living Arrangements: Other relatives                  Prior Function Level of Independence: Independent with assistive device(s)          PT Goals (current goals can  now be found in the care plan section) Acute Rehab PT Goals Patient Stated Goal: Regain IND and have the other hip done Progress towards PT goals: Progressing toward goals    Frequency    7X/week      PT Plan Current plan remains appropriate    Co-evaluation              AM-PAC PT "6 Clicks" Daily Activity  Outcome Measure  Difficulty turning over in bed (including adjusting bedclothes, sheets and blankets)?: Unable Difficulty moving from lying on back to sitting on the side of the bed? : Unable Difficulty sitting down on and standing up from a chair with arms (e.g., wheelchair, bedside commode, etc,.)?: Unable Help needed moving to and from a bed to chair (including a wheelchair)?: A Lot Help needed walking in hospital  room?: A Lot Help needed climbing 3-5 steps with a railing? : Total 6 Click Score: 8    End of Session Equipment Utilized During Treatment: Gait belt Activity Tolerance: Patient limited by fatigue;Patient limited by pain Patient left: in chair;with call bell/phone within reach;with nursing/sitter in room Nurse Communication: Mobility status PT Visit Diagnosis: Unsteadiness on feet (R26.81);Difficulty in walking, not elsewhere classified (R26.2);Pain Pain - Right/Left: Right     Time: 0093-8182 PT Time Calculation (min) (ACUTE ONLY): 14 min  Charges:  $Gait Training: 8-22 mins                    G Codes:       Rica Koyanagi  PTA [facility]  Acute  Rehab Pager      [phone]

## 2017-02-07 NOTE — Discharge Summary (Signed)
Physician Discharge Summary   Patient ID: Joan [name] MRN: [id number] DOB/AGE: 1960/11/10 56 y.o.  Admit date: 02/05/2017 Discharge date: 02/07/2017  Primary Diagnosis: Primary osteoarthritis right hip   Admission Diagnoses:  Past Medical History:  Diagnosis Date  . Alcoholism and drug addiction in family   . Anxiety   . Arthritis    "knees, hips, lower back" (09/25/2016)  . Asthma   . Bipolar disorder ([place])   . Chronic bronchitis ([place])    "all the time" (09/25/2016)  . Chronic leg pain    RLE  . Chronic lower back pain   . COPD (chronic obstructive pulmonary disease) ([place])   . Depression   . Emphysema of lung ([place])   . Hepatitis C    "treated back in 2001-2002"  . High cholesterol   . Hypertension   . Hypothyroidism   . OSA on CPAP   . Pneumonia    "all the time" (09/25/2016)  . Thyroid disease   . Type II diabetes mellitus ([place])    Discharge Diagnoses:   Active Problems:   H/O total hip arthroplasty, right  Estimated body mass index is 33.52 kg/m as calculated from the following:   Height as of this encounter: _0  (1.702 m).   Weight as of this encounter: 97.1 kg (214 lb).  Procedure(s) (LRB): RIGHT TOTAL HIP ARTHROPLASTY (Right)   Consults: None  HPI: Annmarie Plemmons, 56 y.o. female, has a history of pain and functional disability in the right hip(s) due to arthritis and patient has failed non-surgical conservative treatments for greater than 12 weeks to include NSAID's and/or analgesics, flexibility and strengthening excercises, use of assistive devices and activity modification.  Onset of symptoms was gradual starting 2 years ago with gradually worsening course since that time.The patient noted no past surgery on the right hip(s).  Patient currently rates pain in the right hip at 7 out of 10 with activity. Patient has night pain, worsening of pain with activity and weight bearing, pain that interfers with activities of daily living and pain with passive range of  motion. Patient has evidence of periarticular osteophytes and joint space narrowing by imaging studies. This condition presents safety issues increasing the risk of falls.  There is no current active infection.    Laboratory Data: Admission on 02/05/2017  Component Date Value Ref Range Status  . Glucose-Capillary 02/05/2017 170* 65 - 99 mg/dL Final  . WBC 02/06/2017 12.8* 4.0 - 10.5 K/uL Final  . RBC 02/06/2017 4.00  3.87 - 5.11 MIL/uL Final  . Hemoglobin 02/06/2017 12.9  12.0 - 15.0 g/dL Final  . HCT 02/06/2017 37.8  36.0 - 46.0 % Final  . MCV 02/06/2017 94.5  78.0 - 100.0 fL Final  . MCH 02/06/2017 32.3  26.0 - 34.0 pg Final  . MCHC 02/06/2017 34.1  30.0 - 36.0 g/dL Final  . RDW 02/06/2017 13.1  11.5 - 15.5 % Final  . Platelets 02/06/2017 208  150 - 400 K/uL Final  . Sodium 02/06/2017 136  135 - 145 mmol/L Final  . Potassium 02/06/2017 3.5  3.5 - 5.1 mmol/L Final  . Chloride 02/06/2017 98* 101 - 111 mmol/L Final  . CO2 02/06/2017 28  22 - 32 mmol/L Final  . Glucose, Bld 02/06/2017 165* 65 - 99 mg/dL Final  . BUN 02/06/2017 14  6 - 20 mg/dL Final  . Creatinine, Ser 02/06/2017 0.89  0.44 - 1.00 mg/dL Final  . Calcium 02/06/2017 8.9  8.9 - 10.3 mg/dL Final  .  GFR calc non Af Amer 02/06/2017 >60  >60 mL/min Final  . GFR calc Af Amer 02/06/2017 >60  >60 mL/min Final   Comment: (NOTE) The eGFR has been calculated using the CKD EPI equation. This calculation has not been validated in all clinical situations. eGFR's persistently <60 mL/min signify possible Chronic Kidney Disease.   . Anion gap 02/06/2017 10  5 - 15 Final  . WBC 02/07/2017 16.3* 4.0 - 10.5 K/uL Final  . RBC 02/07/2017 3.65* 3.87 - 5.11 MIL/uL Final  . Hemoglobin 02/07/2017 11.6* 12.0 - 15.0 g/dL Final  . HCT 02/07/2017 34.9* 36.0 - 46.0 % Final  . MCV 02/07/2017 95.6  78.0 - 100.0 fL Final  . MCH 02/07/2017 31.8  26.0 - 34.0 pg Final  . MCHC 02/07/2017 33.2  30.0 - 36.0 g/dL Final  . RDW 02/07/2017 13.3  11.5 - 15.5  % Final  . Platelets 02/07/2017 223  150 - 400 K/uL Final  . Sodium 02/07/2017 134* 135 - 145 mmol/L Final  . Potassium 02/07/2017 4.0  3.5 - 5.1 mmol/L Final  . Chloride 02/07/2017 98* 101 - 111 mmol/L Final  . CO2 02/07/2017 26  22 - 32 mmol/L Final  . Glucose, Bld 02/07/2017 171* 65 - 99 mg/dL Final  . BUN 02/07/2017 8  6 - 20 mg/dL Final  . Creatinine, Ser 02/07/2017 0.75  0.44 - 1.00 mg/dL Final  . Calcium 02/07/2017 8.5* 8.9 - 10.3 mg/dL Final  . GFR calc non Af Amer 02/07/2017 >60  >60 mL/min Final  . GFR calc Af Amer 02/07/2017 >60  >60 mL/min Final   Comment: (NOTE) The eGFR has been calculated using the CKD EPI equation. This calculation has not been validated in all clinical situations. eGFR's persistently <60 mL/min signify possible Chronic Kidney Disease.   Georgiann Hahn gap 02/07/2017 10  5 - 15 Final  Hospital Outpatient Visit on 02/03/2017  Component Date Value Ref Range Status  . aPTT 02/03/2017 27  24 - 36 seconds Final  . WBC 02/03/2017 12.2* 4.0 - 10.5 K/uL Final  . RBC 02/03/2017 4.34  3.87 - 5.11 MIL/uL Final  . Hemoglobin 02/03/2017 14.0  12.0 - 15.0 g/dL Final  . HCT 02/03/2017 40.9  36.0 - 46.0 % Final  . MCV 02/03/2017 94.2  78.0 - 100.0 fL Final  . MCH 02/03/2017 32.3  26.0 - 34.0 pg Final  . MCHC 02/03/2017 34.2  30.0 - 36.0 g/dL Final  . RDW 02/03/2017 13.1  11.5 - 15.5 % Final  . Platelets 02/03/2017 259  150 - 400 K/uL Final  . Neutrophils Relative % 02/03/2017 64  % Final  . Neutro Abs 02/03/2017 7.9* 1.7 - 7.7 K/uL Final  . Lymphocytes Relative 02/03/2017 25  % Final  . Lymphs Abs 02/03/2017 3.0  0.7 - 4.0 K/uL Final  . Monocytes Relative 02/03/2017 7  % Final  . Monocytes Absolute 02/03/2017 0.9  0.1 - 1.0 K/uL Final  . Eosinophils Relative 02/03/2017 3  % Final  . Eosinophils Absolute 02/03/2017 0.3  0.0 - 0.7 K/uL Final  . Basophils Relative 02/03/2017 1  % Final  . Basophils Absolute 02/03/2017 0.1  0.0 - 0.1 K/uL Final  . Sodium 02/03/2017 138   135 - 145 mmol/L Final  . Potassium 02/03/2017 4.2  3.5 - 5.1 mmol/L Final  . Chloride 02/03/2017 102  101 - 111 mmol/L Final  . CO2 02/03/2017 27  22 - 32 mmol/L Final  . Glucose, Bld 02/03/2017 132* 65 -  99 mg/dL Final  . BUN 02/03/2017 20  6 - 20 mg/dL Final  . Creatinine, Ser 02/03/2017 0.99  0.44 - 1.00 mg/dL Final  . Calcium 02/03/2017 9.5  8.9 - 10.3 mg/dL Final  . Total Protein 02/03/2017 7.3  6.5 - 8.1 g/dL Final  . Albumin 02/03/2017 4.3  3.5 - 5.0 g/dL Final  . AST 02/03/2017 27  15 - 41 U/L Final  . ALT 02/03/2017 34  14 - 54 U/L Final  . Alkaline Phosphatase 02/03/2017 135* 38 - 126 U/L Final  . Total Bilirubin 02/03/2017 0.5  0.3 - 1.2 mg/dL Final  . GFR calc non Af Amer 02/03/2017 >60  >60 mL/min Final  . GFR calc Af Amer 02/03/2017 >60  >60 mL/min Final   Comment: (NOTE) The eGFR has been calculated using the CKD EPI equation. This calculation has not been validated in all clinical situations. eGFR's persistently <60 mL/min signify possible Chronic Kidney Disease.   . Anion gap 02/03/2017 9  5 - 15 Final  . Prothrombin Time 02/03/2017 12.5  11.4 - 15.2 seconds Final  . INR 02/03/2017 0.94   Final  . ABO/RH(D) 02/03/2017 B POS   Final  . Antibody Screen 02/03/2017 NEG   Final  . Sample Expiration 02/03/2017 02/08/2017   Final  . Extend sample reason 02/03/2017 NO TRANSFUSIONS OR PREGNANCY IN THE PAST 3 MONTHS   Final  . Color, Urine 02/03/2017 YELLOW  YELLOW Final  . APPearance 02/03/2017 CLEAR  CLEAR Final  . Specific Gravity, Urine 02/03/2017 1.016  1.005 - 1.030 Final  . pH 02/03/2017 5.0  5.0 - 8.0 Final  . Glucose, UA 02/03/2017 NEGATIVE  NEGATIVE mg/dL Final  . Hgb urine dipstick 02/03/2017 SMALL* NEGATIVE Final  . Bilirubin Urine 02/03/2017 NEGATIVE  NEGATIVE Final  . Ketones, ur 02/03/2017 NEGATIVE  NEGATIVE mg/dL Final  . Protein, ur 02/03/2017 NEGATIVE  NEGATIVE mg/dL Final  . Nitrite 02/03/2017 NEGATIVE  NEGATIVE Final  . Leukocytes, UA 02/03/2017  NEGATIVE  NEGATIVE Final  . RBC / HPF 02/03/2017 0-5  0 - 5 RBC/hpf Final  . WBC, UA 02/03/2017 0-5  0 - 5 WBC/hpf Final  . Bacteria, UA 02/03/2017 RARE* NONE SEEN Final  . Squamous Epithelial / LPF 02/03/2017 0-5* NONE SEEN Final  . Mucus 02/03/2017 PRESENT   Final  . Hyaline Casts, UA 02/03/2017 PRESENT   Final  . MRSA, PCR 02/03/2017 POSITIVE* NEGATIVE Final   Comment: RESULT CALLED TO, READ BACK BY AND VERIFIED WITH: AFTERHOURS   . Staphylococcus aureus 02/03/2017 POSITIVE* NEGATIVE Final   Comment: (NOTE) The Xpert SA Assay (FDA approved for NASAL specimens in patients 17 years of age and older), is one component of a comprehensive surveillance program. It is not intended to diagnose infection nor to guide or monitor treatment.   . Glucose-Capillary 02/03/2017 140* 65 - 99 mg/dL Final  . ABO/RH(D) 02/03/2017 B POS   Final  . Hgb A1c MFr Bld 02/03/2017 6.0* 4.8 - 5.6 % Final   Comment: (NOTE) Pre diabetes:          5.7%-6.4% Diabetes:              >6.4% Glycemic control for   <7.0% adults with diabetes   . Mean Plasma Glucose 02/03/2017 125.5  mg/dL Final   Performed at [facility] [address], [place], [place] [address]     X-Rays:Dg Chest 2 View  Result Date: 02/03/2017 CLINICAL DATA:  Preop hip surgery. EXAM: CHEST  2 VIEW COMPARISON:  CT chest 12/03/2016 FINDINGS: The lungs are hyperinflated likely secondary to COPD. There is no focal parenchymal opacity. There is no pleural effusion or pneumothorax. The heart and mediastinal contours are unremarkable. The osseous structures are unremarkable. IMPRESSION: No active cardiopulmonary disease. Emphysema. ([id number]) Electronically Signed   By: Kathreen Devoid   On: 02/03/2017 12:37   Dg Hip Unilat With Pelvis 1v Right  Result Date: 02/06/2017 CLINICAL DATA:  Postoperative pain EXAM: DG HIP   1V RIGHT COMPARISON:  February 05, 2017. FINDINGS: Frontal view obtained. There is a total hip replacement on the right with  the study components appearing well-seated on frontal view. No fracture or dislocation. No erosion evident. Soft tissue air is an expected postoperative finding. IMPRESSION: No appreciable change from 1 day prior. Prosthetic components appear well seated on frontal view. No evident fracture or dislocation. These results will be called to the ordering clinician or representative by the Radiology Department at the imaging location. Electronically Signed   By: Lowella Grip III M.D.   On: 02/06/2017 09:16   Dg Hip Port Unilat With Pelvis 1v Right  Result Date: 02/05/2017 CLINICAL DATA:  Right hip replacement EXAM: DG HIP (WITH OR WITHOUT PELVIS) 1V PORT RIGHT COMPARISON:  None. FINDINGS: Interval right total hip arthroplasty without failure or complication. No acute fracture or dislocation. Postsurgical changes in the surrounding soft tissues. IMPRESSION: Interval right total hip arthroplasty. Electronically Signed   By: Kathreen Devoid   On: 02/05/2017 10:58    EKG: Orders placed or performed during the hospital encounter of 02/05/17  . EKG  . EKG     Hospital Course: Patient was admitted to [facility] and taken to the OR and underwent the above state procedure without complications.  Patient tolerated the procedure well and was later transferred to the recovery room and then to the orthopaedic floor for postoperative care.  They were given PO and IV analgesics for pain control following their surgery.  They were given 24 hours of postoperative antibiotics of  Anti-infectives (From admission, onward)   Start     Dose/Rate Route Frequency Ordered Stop   02/05/17 2000  vancomycin (VANCOCIN) IVPB 1000 mg/200 mL premix     1,000 mg 200 mL/hr over 60 Minutes Intravenous Every 12 hours 02/05/17 1210 02/05/17 2313   02/05/17 1800  valACYclovir (VALTREX) tablet 500 mg     500 mg Oral Daily 02/05/17 1210     02/05/17 0902  polymyxin B 500,000 Units, bacitracin 50,000 Units in sodium chloride  0.9 % 500 mL irrigation  Status:  Discontinued       As needed 02/05/17 0908 02/05/17 1026   02/05/17 0808  vancomycin (VANCOCIN) 1-5 GM/200ML-% IVPB    Comments:  Harle Stanford   : cabinet override      02/05/17 0808 02/05/17 0840   02/05/17 0630  vancomycin (VANCOCIN) IVPB 1000 mg/200 mL premix     1,000 mg 200 mL/hr over 60 Minutes Intravenous On call to O.R. 02/05/17 0630 02/05/17 0830     and started on DVT prophylaxis in the form of Xarelto.   PT and OT were ordered for total hip protocol.  The patient was allowed to be PWB 50% with therapy. Discharge planning was consulted to help with postop disposition and equipment needs.  Patient had a difficult night on the evening of surgery.  They started to get up OOB with therapy on day one but had too much pain to continue. X-rays  showed no issues with the hip but she remained on bed rest. Continued to work with therapy into day two. Aquacel dressing was in good condition. By day two, plan was to continue therapy and discharge to SNF.   Diet: Cardiac diet Activity:PWB 50% No bending hip over 90 degrees- A "L" Angle Do not cross legs Do not let foot roll inward When turning these patients a pillow should be placed between the patient's legs to prevent crossing. Patients should have the affected knee fully extended when trying to sit or stand from all surfaces to prevent excessive hip flexion. When ambulating and turning toward the affected side the affected leg should have the toes turned out prior to moving the walker and the rest of patient's body as to prevent internal rotation/ turning in of the leg. Abduction pillows are the most effective way to prevent a patient from not crossing legs or turning toes in at rest. If an abduction pillow is not ordered placing a regular pillow length wise between the patient's legs is also an effective reminder. It is imperative that these precautions be maintained so that the surgical hip does not  dislocate. Follow-up:in 2 weeks Disposition - Skilled nursing facility Discharged Condition: stable   Discharge Instructions    Call MD / Call 911   Complete by:  As directed    If you experience chest pain or shortness of breath, CALL 911 and be transported to the hospital emergency room.  If you develope a fever above 101 F, pus (white drainage) or increased drainage or redness at the wound, or calf pain, call your surgeon's office.   Constipation Prevention   Complete by:  As directed    Drink plenty of fluids.  Prune juice may be helpful.  You may use a stool softener, such as Colace (over the counter) 100 mg twice a day.  Use MiraLax (over the counter) for constipation as needed.   Diet - low sodium heart healthy   Complete by:  As directed    Discharge instructions   Complete by:  As directed    INSTRUCTIONS AFTER JOINT REPLACEMENT   Remove items at home which could result in a fall. This includes throw rugs or furniture in walking pathways ICE to the affected joint every three hours while awake for 30 minutes at a time, for at least the first 3-5 days, and then as needed for pain and swelling.  Continue to use ice for pain and swelling. You may notice swelling that will progress down to the foot and ankle.  This is normal after surgery.  Elevate your leg when you are not up walking on it.   Continue to use the breathing machine you got in the hospital (incentive spirometer) which will help keep your temperature down.  It is common for your temperature to cycle up and down following surgery, especially at night when you are not up moving around and exerting yourself.  The breathing machine keeps your lungs expanded and your temperature down.   DIET:  As you were doing prior to hospitalization, we recommend a well-balanced diet.  DRESSING / WOUND CARE / SHOWERING  Keep the surgical dressing until follow up.  The dressing is water proof, so you can shower without any extra covering.   IF THE DRESSING FALLS OFF or the wound gets wet inside, change the dressing with sterile gauze.  Please use good hand washing techniques before changing the dressing.  Do not use any lotions or  creams on the incision until instructed by your surgeon.    ACTIVITY  Increase activity slowly as tolerated, but follow the weight bearing instructions below.   No driving for 6 weeks or until further direction given by your physician.  You cannot drive while taking narcotics.  No lifting or carrying greater than 10 lbs. until further directed by your surgeon. Avoid periods of inactivity such as sitting longer than an hour when not asleep. This helps prevent blood clots.  You may return to work once you are authorized by your doctor.     WEIGHT BEARING   Partial weight bearing with assist device as directed.  50% right LE   EXERCISES  Results after joint replacement surgery are often greatly improved when you follow the exercise, range of motion and muscle strengthening exercises prescribed by your doctor. Safety measures are also important to protect the joint from further injury. Any time any of these exercises cause you to have increased pain or swelling, decrease what you are doing until you are comfortable again and then slowly increase them. If you have problems or questions, call your caregiver or physical therapist for advice.   Rehabilitation is important following a joint replacement. After just a few days of immobilization, the muscles of the leg can become weakened and shrink (atrophy).  These exercises are designed to build up the tone and strength of the thigh and leg muscles and to improve motion. Often times heat used for twenty to thirty minutes before working out will loosen up your tissues and help with improving the range of motion but do not use heat for the first two weeks following surgery (sometimes heat can increase post-operative swelling).   These exercises can be done on a  training (exercise) mat, on the floor, on a table or on a bed. Use whatever works the best and is most comfortable for you.    Use music or television while you are exercising so that the exercises are a pleasant break in your day. This will make your life better with the exercises acting as a break in your routine that you can look forward to.   Perform all exercises about fifteen times, three times per day or as directed.  You should exercise both the operative leg and the other leg as well.  Exercises include:   Quad Sets - Tighten up the muscle on the front of the thigh (Quad) and hold for 5-10 seconds.   Straight Leg Raises - With your knee straight (if you were given a brace, keep it on), lift the leg to 60 degrees, hold for 3 seconds, and slowly lower the leg.  Perform this exercise against resistance later as your leg gets stronger.  Leg Slides: Lying on your back, slowly slide your foot toward your buttocks, bending your knee up off the floor (only go as far as is comfortable). Then slowly slide your foot back down until your leg is flat on the floor again.  Angel Wings: Lying on your back spread your legs to the side as far apart as you can without causing discomfort.  Hamstring Strength:  Lying on your back, push your heel against the floor with your leg straight by tightening up the muscles of your buttocks.  Repeat, but this time bend your knee to a comfortable angle, and push your heel against the floor.  You may put a pillow under the heel to make it more comfortable if necessary.   A rehabilitation program  following joint replacement surgery can speed recovery and prevent re-injury in the future due to weakened muscles. Contact your doctor or a physical therapist for more information on knee rehabilitation.    CONSTIPATION  Constipation is defined medically as fewer than three stools per week and severe constipation as less than one stool per week.  Even if you have a regular bowel  pattern at home, your normal regimen is likely to be disrupted due to multiple reasons following surgery.  Combination of anesthesia, postoperative narcotics, change in appetite and fluid intake all can affect your bowels.   YOU MUST use at least one of the following options; they are listed in order of increasing strength to get the job done.  They are all available over the counter, and you may need to use some, POSSIBLY even all of these options:    Drink plenty of fluids (prune juice may be helpful) and high fiber foods Colace 100 mg by mouth twice a day  Senokot for constipation as directed and as needed Dulcolax (bisacodyl), take with full glass of water  Miralax (polyethylene glycol) once or twice a day as needed.  If you have tried all these things and are unable to have a bowel movement in the first 3-4 days after surgery call either your surgeon or your primary doctor.    If you experience loose stools or diarrhea, hold the medications until you stool forms back up.  If your symptoms do not get better within 1 week or if they get worse, check with your doctor.  If you experience "the worst abdominal pain ever" or develop nausea or vomiting, please contact the office immediately for further recommendations for treatment.   ITCHING:  If you experience itching with your medications, try taking only a single pain pill, or even half a pain pill at a time.  You can also use Benadryl over the counter for itching or also to help with sleep.   TED HOSE STOCKINGS:  Use stockings on both legs until for at least 2 weeks or as directed by physician office. They may be removed at night for sleeping.  MEDICATIONS:  See your medication summary on the "After Visit Summary" that nursing will review with you.  You may have some home medications which will be placed on hold until you complete the course of blood thinner medication.  It is important for you to complete the blood thinner medication as  prescribed.  PRECAUTIONS:  If you experience chest pain or shortness of breath - call 911 immediately for transfer to the hospital emergency department.   If you develop a fever greater that 101 F, purulent drainage from wound, increased redness or drainage from wound, foul odor from the wound/dressing, or calf pain - CONTACT YOUR SURGEON.                                                   FOLLOW-UP APPOINTMENTS:  If you do not already have a post-op appointment, please call the office for an appointment to be seen by your surgeon.  Guidelines for how soon to be seen are listed in your "After Visit Summary", but are typically between 1-4 weeks after surgery.   MAKE SURE YOU:  Understand these instructions.  Get help right away if you are not doing well or get worse.  Thank you for letting us be a part of your medical care team.  It is a privilege we respect greatly.  We hope these instructions will help you stay on track for a fast and full recovery!   Follow the hip precautions as taught in Physical Therapy   Complete by:  As directed    Increase activity slowly as tolerated   Complete by:  As directed      Allergies as of 02/07/2017      Reactions   Dilaudid [hydromorphone Hcl] Other (See Comments)   "heavy sedation"   Keflex [cephalexin] Other (See Comments)   unspecified   Prednisone Other (See Comments)   "counter reacts"   Shellfish Allergy Other (See Comments)   Stomach cramps   Tetracyclines & Related Other (See Comments)   unspecified      Medication List    STOP taking these medications   baclofen 10 MG tablet Commonly known as:  LIORESAL   clonazePAM 0.5 MG tablet Commonly known as:  KLONOPIN   Lorcaserin HCl ER 20 MG Tb24 Commonly known as:  BELVIQ XR   nystatin-triamcinolone ointment Commonly known as:  MYCOLOG   pantoprazole 40 MG tablet Commonly known as:  PROTONIX     TAKE these medications   albuterol 108 (90 Base) MCG/ACT inhaler Commonly  known as:  PROVENTIL HFA;VENTOLIN HFA Inhale 2 puffs into the lungs every 6 (six) hours as needed for wheezing or shortness of breath (before activity).   albuterol (2.5 MG/3ML) 0.083% nebulizer solution Commonly known as:  PROVENTIL Take 3 mLs (2.5 mg total) by nebulization every 6 (six) hours as needed for wheezing or shortness of breath.   aspirin EC 325 MG tablet Take 1 tablet (325 mg total) by mouth 2 (two) times daily.   atenolol 25 MG tablet Commonly known as:  TENORMIN Take 1 tablet (25 mg total) by mouth daily.   atorvastatin 20 MG tablet Commonly known as:  LIPITOR Take 1 tablet (20 mg total) by mouth daily.   budesonide-formoterol 160-4.5 MCG/ACT inhaler Commonly known as:  SYMBICORT Inhale 2 puffs into the lungs 2 (two) times daily.   diphenhydrAMINE 25 mg capsule Commonly known as:  BENADRYL Take 25 mg by mouth at bedtime.   docusate sodium 100 MG capsule Commonly known as:  COLACE Take 1 capsule (100 mg total) by mouth 2 (two) times daily.   ferrous sulfate 325 (65 FE) MG tablet Take 1 tablet (325 mg total) by mouth 2 (two) times daily with a meal.   levothyroxine 25 MCG tablet Commonly known as:  SYNTHROID, LEVOTHROID Take 1 tablet (25 mcg total) by mouth daily before breakfast.   methocarbamol 500 MG tablet Commonly known as:  ROBAXIN Take 1 tablet (500 mg total) by mouth every 6 (six) hours as needed for muscle spasms.   montelukast 10 MG tablet Commonly known as:  SINGULAIR Take 10 mg by mouth at bedtime.   naproxen 500 MG tablet Commonly known as:  NAPROSYN Take 500 mg by mouth 3 (three) times daily with meals.   oxyCODONE 5 MG immediate release tablet Commonly known as:  Oxy IR/ROXICODONE Take 1-3 tablets (5-15 mg total) by mouth every 3 (three) hours as needed for severe pain ((score 7 to 10)).   polyethylene glycol packet Commonly known as:  MIRALAX / GLYCOLAX Take 17 g by mouth daily as needed for mild constipation.   pregabalin 75 MG  capsule Commonly known as:  LYRICA Take 1 capsule (75 mg total) 2 (  two) times daily by mouth.   roflumilast 500 MCG Tabs tablet Commonly known as:  DALIRESP Take 1 tablet (500 mcg total) by mouth daily.   tiotropium 18 MCG inhalation capsule Commonly known as:  SPIRIVA Place 1 capsule (18 mcg total) into inhaler and inhale daily.   valACYclovir 500 MG tablet Commonly known as:  VALTREX Take 500 mg by mouth daily.   varenicline 1 MG tablet Commonly known as:  CHANTIX CONTINUING MONTH PAK Take 1 tablet (1 mg total) by mouth 2 (two) times daily.   venlafaxine XR 150 MG 24 hr capsule Commonly known as:  EFFEXOR-XR Take 1 capsule (150 mg total) by mouth daily with breakfast.      Follow-up Information    [name], MD. Schedule an appointment as soon as possible for a visit in 2 week(s).   Specialty:  Orthopedic Surgery Contact information: [address] [place] [address] [phone]           Signed: Ardeen Jourdain, PA-C Orthopaedic Surgery 02/07/2017, 7:30 AM

## 2017-02-07 NOTE — Discharge Instructions (Signed)
INSTRUCTIONS AFTER JOINT REPLACEMENT   o Remove items at home which could result in a fall. This includes throw rugs or furniture in walking pathways o ICE to the affected joint every three hours while awake for 30 minutes at a time, for at least the first 3-5 days, and then as needed for pain and swelling.  Continue to use ice for pain and swelling. You may notice swelling that will progress down to the foot and ankle.  This is normal after surgery.  Elevate your leg when you are not up walking on it.   o Continue to use the breathing machine you got in the hospital (incentive spirometer) which will help keep your temperature down.  It is common for your temperature to cycle up and down following surgery, especially at night when you are not up moving around and exerting yourself.  The breathing machine keeps your lungs expanded and your temperature down.   DIET:  As you were doing prior to hospitalization, we recommend a well-balanced diet.  DRESSING / WOUND CARE / SHOWERING  Keep the surgical dressing until follow up.  The dressing is water proof, so you can shower without any extra covering.  IF THE DRESSING FALLS OFF or the wound gets wet inside, change the dressing with sterile gauze.  Please use good hand washing techniques before changing the dressing.  Do not use any lotions or creams on the incision until instructed by your surgeon.    ACTIVITY  o Increase activity slowly as tolerated, but follow the weight bearing instructions below.   o No driving for 6 weeks or until further direction given by your physician.  You cannot drive while taking narcotics.  o No lifting or carrying greater than 10 lbs. until further directed by your surgeon. o Avoid periods of inactivity such as sitting longer than an hour when not asleep. This helps prevent blood clots.  o You may return to work once you are authorized by your doctor.     WEIGHT BEARING   Partial weight bearing with assist device as  directed.  50% right LE   EXERCISES  Results after joint replacement surgery are often greatly improved when you follow the exercise, range of motion and muscle strengthening exercises prescribed by your doctor. Safety measures are also important to protect the joint from further injury. Any time any of these exercises cause you to have increased pain or swelling, decrease what you are doing until you are comfortable again and then slowly increase them. If you have problems or questions, call your caregiver or physical therapist for advice.   Rehabilitation is important following a joint replacement. After just a few days of immobilization, the muscles of the leg can become weakened and shrink (atrophy).  These exercises are designed to build up the tone and strength of the thigh and leg muscles and to improve motion. Often times heat used for twenty to thirty minutes before working out will loosen up your tissues and help with improving the range of motion but do not use heat for the first two weeks following surgery (sometimes heat can increase post-operative swelling).   These exercises can be done on a training (exercise) mat, on the floor, on a table or on a bed. Use whatever works the best and is most comfortable for you.    Use music or television while you are exercising so that the exercises are a pleasant break in your day. This will make your life better with  the exercises acting as a break in your routine that you can look forward to.   Perform all exercises about fifteen times, three times per day or as directed.  You should exercise both the operative leg and the other leg as well. ° °Exercises include: °  °• Quad Sets - Tighten up the muscle on the front of the thigh (Quad) and hold for 5-10 seconds.   °• Straight Leg Raises - With your knee straight (if you were given a brace, keep it on), lift the leg to 60 degrees, hold for 3 seconds, and slowly lower the leg.  Perform this exercise  against resistance later as your leg gets stronger.  °• Leg Slides: Lying on your back, slowly slide your foot toward your buttocks, bending your knee up off the floor (only go as far as is comfortable). Then slowly slide your foot back down until your leg is flat on the floor again.  °• Angel Wings: Lying on your back spread your legs to the side as far apart as you can without causing discomfort.  °• Hamstring Strength:  Lying on your back, push your heel against the floor with your leg straight by tightening up the muscles of your buttocks.  Repeat, but this time bend your knee to a comfortable angle, and push your heel against the floor.  You may put a pillow under the heel to make it more comfortable if necessary.  ° °A rehabilitation program following joint replacement surgery can speed recovery and prevent re-injury in the future due to weakened muscles. Contact your doctor or a physical therapist for more information on knee rehabilitation.  ° ° °CONSTIPATION ° °Constipation is defined medically as fewer than three stools per week and severe constipation as less than one stool per week.  Even if you have a regular bowel pattern at home, your normal regimen is likely to be disrupted due to multiple reasons following surgery.  Combination of anesthesia, postoperative narcotics, change in appetite and fluid intake all can affect your bowels.  ° °YOU MUST use at least one of the following options; they are listed in order of increasing strength to get the job done.  They are all available over the counter, and you may need to use some, POSSIBLY even all of these options:   ° °Drink plenty of fluids (prune juice may be helpful) and high fiber foods °Colace 100 mg by mouth twice a day  °Senokot for constipation as directed and as needed Dulcolax (bisacodyl), take with full glass of water  °Miralax (polyethylene glycol) once or twice a day as needed. ° °If you have tried all these things and are unable to have a  bowel movement in the first 3-4 days after surgery call either your surgeon or your primary doctor.   ° °If you experience loose stools or diarrhea, hold the medications until you stool forms back up.  If your symptoms do not get better within 1 week or if they get worse, check with your doctor.  If you experience "the worst abdominal pain ever" or develop nausea or vomiting, please contact the office immediately for further recommendations for treatment. ° ° °ITCHING:  If you experience itching with your medications, try taking only a single pain pill, or even half a pain pill at a time.  You can also use Benadryl over the counter for itching or also to help with sleep.  ° °TED HOSE STOCKINGS:  Use stockings on both legs until for   at least 2 weeks or as directed by physician office. They may be removed at night for sleeping.  MEDICATIONS:  See your medication summary on the After Visit Summary that nursing will review with you.  You may have some home medications which will be placed on hold until you complete the course of blood thinner medication.  It is important for you to complete the blood thinner medication as prescribed.  PRECAUTIONS:  If you experience chest pain or shortness of breath - call 911 immediately for transfer to the hospital emergency department.   If you develop a fever greater that 101 F, purulent drainage from wound, increased redness or drainage from wound, foul odor from the wound/dressing, or calf pain - CONTACT YOUR SURGEON.                                                   FOLLOW-UP APPOINTMENTS:  If you do not already have a post-op appointment, please call the office for an appointment to be seen by your surgeon.  Guidelines for how soon to be seen are listed in your After Visit Summary, but are typically between 1-4 weeks after surgery.   MAKE SURE YOU:   Understand these instructions.   Get help right away if you are not doing well or get worse.    Thank you  for letting us be a part of your medical care team.  It is a privilege we respect greatly.  We hope these instructions will help you stay on track for a fast and full recovery!

## 2017-02-09 DIAGNOSIS — R2689 Other abnormalities of gait and mobility: Secondary | ICD-10-CM | POA: Diagnosis not present

## 2017-02-09 DIAGNOSIS — M79604 Pain in right leg: Secondary | ICD-10-CM | POA: Diagnosis not present

## 2017-02-10 ENCOUNTER — Non-Acute Institutional Stay (SKILLED_NURSING_FACILITY): Payer: Medicare HMO | Admitting: Adult Health

## 2017-02-10 ENCOUNTER — Encounter: Payer: Self-pay | Admitting: Adult Health

## 2017-02-10 DIAGNOSIS — E785 Hyperlipidemia, unspecified: Secondary | ICD-10-CM

## 2017-02-10 DIAGNOSIS — D508 Other iron deficiency anemias: Secondary | ICD-10-CM

## 2017-02-10 DIAGNOSIS — J449 Chronic obstructive pulmonary disease, unspecified: Secondary | ICD-10-CM | POA: Diagnosis not present

## 2017-02-10 DIAGNOSIS — I10 Essential (primary) hypertension: Secondary | ICD-10-CM | POA: Diagnosis not present

## 2017-02-10 DIAGNOSIS — R2689 Other abnormalities of gait and mobility: Secondary | ICD-10-CM | POA: Diagnosis not present

## 2017-02-10 DIAGNOSIS — M150 Primary generalized (osteo)arthritis: Secondary | ICD-10-CM

## 2017-02-10 DIAGNOSIS — K5909 Other constipation: Secondary | ICD-10-CM

## 2017-02-10 DIAGNOSIS — M1990 Unspecified osteoarthritis, unspecified site: Secondary | ICD-10-CM | POA: Insufficient documentation

## 2017-02-10 DIAGNOSIS — B001 Herpesviral vesicular dermatitis: Secondary | ICD-10-CM | POA: Diagnosis not present

## 2017-02-10 DIAGNOSIS — Z96641 Presence of right artificial hip joint: Secondary | ICD-10-CM

## 2017-02-10 DIAGNOSIS — E782 Mixed hyperlipidemia: Secondary | ICD-10-CM | POA: Insufficient documentation

## 2017-02-10 DIAGNOSIS — J9611 Chronic respiratory failure with hypoxia: Secondary | ICD-10-CM

## 2017-02-10 DIAGNOSIS — R69 Illness, unspecified: Secondary | ICD-10-CM | POA: Diagnosis not present

## 2017-02-10 DIAGNOSIS — M79604 Pain in right leg: Secondary | ICD-10-CM | POA: Diagnosis not present

## 2017-02-10 DIAGNOSIS — M159 Polyosteoarthritis, unspecified: Secondary | ICD-10-CM

## 2017-02-10 DIAGNOSIS — M8949 Other hypertrophic osteoarthropathy, multiple sites: Secondary | ICD-10-CM

## 2017-02-10 DIAGNOSIS — F17200 Nicotine dependence, unspecified, uncomplicated: Secondary | ICD-10-CM

## 2017-02-10 DIAGNOSIS — E1169 Type 2 diabetes mellitus with other specified complication: Secondary | ICD-10-CM | POA: Insufficient documentation

## 2017-02-10 NOTE — Progress Notes (Signed)
Location:   [facility] Room Number: 101 A Place of Service:  SNF (31)   CODE STATUS: Full code  Allergies  Allergen Reactions  . Dilaudid [Hydromorphone Hcl] Other (See Comments)    "heavy sedation"  . Keflex [Cephalexin] Other (See Comments)    unspecified  . Prednisone Other (See Comments)    "counter reacts"  . Shellfish Allergy Other (See Comments)    Stomach cramps  . Tetracyclines & Related Other (See Comments)    unspecified    Chief Complaint  Patient presents with  . Hospitalization Follow-up    Hospital follow up    HPI:  She is a 56 year old who has been hospitalized for a right hip replacement due to osteoarthritis. She does have joint pain; her current pain regimen is effective. She has severe copd and is having increased wheezing; present with increased shortness of breath. She does complain of heart burn and gas as well. She is here for short term rehab with her goal to return back home.    Past Medical History:  Diagnosis Date  . Alcoholism and drug addiction in family   . Anxiety   . Arthritis    "knees, hips, lower back" (09/25/2016)  . Asthma   . Bipolar disorder ([place])   . Chronic bronchitis ([place])    "all the time" (09/25/2016)  . Chronic leg pain    RLE  . Chronic lower back pain   . COPD (chronic obstructive pulmonary disease) ([place])   . Depression   . Emphysema of lung ([place])   . Hepatitis C    "treated back in 2001-2002"  . High cholesterol   . Hypertension   . Hypothyroidism   . OSA on CPAP   . Pneumonia    "all the time" (09/25/2016)  . Thyroid disease   . Type II diabetes mellitus ([place])     Past Surgical History:  Procedure Laterality Date  . BACK SURGERY    . CARDIAC CATHETERIZATION    . COLONOSCOPY    . CYST EXCISION     removal of cyst in sinuses  . DILATION AND CURETTAGE OF UTERUS    . FOOT SURGERY Bilateral    bone removed from 4th and 5 th toes and put in pin then took pin out  . HERNIA REPAIR    . INCISION  AND DRAINAGE  ~ 2014/2015   "removed mesh & infection"  . LACERATION REPAIR Right    repair wrist artery from laceration from ice skate  . LIPOMA EXCISION Right    fattty lipoma in neck  . NASAL SEPTUM SURGERY    . POSTERIOR LUMBAR FUSION  2015/2016 X 2   "added rods and screws"  . TOTAL HIP ARTHROPLASTY Right 02/05/2017   Procedure: RIGHT TOTAL HIP ARTHROPLASTY;  Surgeon: [name], MD;  Location: [facility];  Service: Orthopedics;  Laterality: Right;  . TUBAL LIGATION    . UMBILICAL HERNIA REPAIR  ~ 2013   w/mesh  . UPPER GI ENDOSCOPY      Social History   Socioeconomic History  . Marital status: Widowed    Spouse name: Not on file  . Number of children: Not on file  . Years of education: Not on file  . Highest education level: Not on file  Social Needs  . Financial resource strain: Not on file  . Food insecurity - worry: Not on file  . Food insecurity - inability: Not on file  . Transportation needs - medical:  Not on file  . Transportation needs - non-medical: Not on file  Occupational History  . Not on file  Tobacco Use  . Smoking status: Former Smoker    Packs/day: 1.00    Years: 44.00    Pack years: 44.00    Types: Cigarettes    Last attempt to quit: 11/22/2016    Years since quitting: 0.2  . Smokeless tobacco: Never Used  . Tobacco comment: pt quit smoking 11/22/16 and currently taking chantix  ep  Substance and Sexual Activity  . Alcohol use: Yes    Comment: 09/25/2016 "stopped a long time ago"  . Drug use: Yes    Types: "Crack" cocaine    Comment: 09/25/2016 "stopped 5 yr ago"  . Sexual activity: No  Other Topics Concern  . Not on file  Social History Narrative  . Not on file   Family History  Problem Relation Age of Onset  . Diabetes Mother   . Hypertension Mother   . Hypertension Father   . Cancer Father        lung      VITAL SIGNS BP 124/62   Pulse 86   Temp 98.9 F (37.2 C)   Resp 18   Ht 5\' 7"  (1.702 m)   Wt 226 lb 9.6 oz (102.8 kg)    SpO2 (!) 85%   BMI 35.49 kg/m   Outpatient Encounter Medications as of 02/10/2017  Medication Sig  . albuterol (PROVENTIL HFA;VENTOLIN HFA) 108 (90 Base) MCG/ACT inhaler Inhale 2 puffs into the lungs every 6 (six) hours as needed for wheezing or shortness of breath (before activity).  Marland Kitchen albuterol (PROVENTIL) (2.5 MG/3ML) 0.083% nebulizer solution Take 3 mLs (2.5 mg total) by nebulization every 6 (six) hours as needed for wheezing or shortness of breath.  Marland Kitchen aspirin EC 325 MG tablet Take 1 tablet (325 mg total) by mouth 2 (two) times daily.  Marland Kitchen atenolol (TENORMIN) 25 MG tablet Take 1 tablet (25 mg total) by mouth daily.  Marland Kitchen atorvastatin (LIPITOR) 20 MG tablet Take 20 mg by mouth daily.  . budesonide-formoterol (SYMBICORT) 160-4.5 MCG/ACT inhaler Inhale 2 puffs into the lungs 2 (two) times daily.  . diphenhydrAMINE (BENADRYL) 25 mg capsule Take 25 mg by mouth at bedtime.  . docusate sodium (COLACE) 100 MG capsule Take 1 capsule (100 mg total) by mouth 2 (two) times daily.  . ferrous sulfate 325 (65 FE) MG tablet Take 1 tablet (325 mg total) by mouth 2 (two) times daily with a meal.  . levothyroxine (SYNTHROID, LEVOTHROID) 25 MCG tablet Take 1 tablet (25 mcg total) by mouth daily before breakfast.  . methocarbamol (ROBAXIN) 500 MG tablet Take 1 tablet (500 mg total) by mouth every 6 (six) hours as needed for muscle spasms.  . montelukast (SINGULAIR) 10 MG tablet Take 10 mg by mouth at bedtime.  . naproxen (NAPROSYN) 500 MG tablet Take 500 mg by mouth every 6 (six) hours as needed.   Marland Kitchen oxyCODONE (OXY IR/ROXICODONE) 5 MG immediate release tablet Take 1-3 tablets (5-15 mg total) by mouth every 3 (three) hours as needed for severe pain ((score 7 to 10)).  Marland Kitchen polyethylene glycol (MIRALAX / GLYCOLAX) packet Take 17 g by mouth daily as needed for mild constipation.  . pregabalin (LYRICA) 75 MG capsule Take 1 capsule (75 mg total) 2 (two) times daily by mouth.  . roflumilast (DALIRESP) 500 MCG TABS tablet  Take 1 tablet (500 mcg total) by mouth daily.  Marland Kitchen tiotropium (SPIRIVA) 18 MCG inhalation  capsule Place 1 capsule (18 mcg total) into inhaler and inhale daily.  . valACYclovir (VALTREX) 500 MG tablet Take 500 mg by mouth daily.   . varenicline (CHANTIX CONTINUING MONTH PAK) 1 MG tablet Take 1 tablet (1 mg total) by mouth 2 (two) times daily.  Marland Kitchen venlafaxine XR (EFFEXOR-XR) 150 MG 24 hr capsule Take 1 capsule (150 mg total) by mouth daily with breakfast.  . [DISCONTINUED] atorvastatin (LIPITOR) 20 MG tablet Take 1 tablet (20 mg total) by mouth daily. (Patient not taking: Reported on 02/10/2017)   No facility-administered encounter medications on file as of 02/10/2017.      SIGNIFICANT DIAGNOSTIC EXAMS  TODAY:   02-06-17: right hip x-ray:  No appreciable change from 1 day prior. Prosthetic components appear well seated on frontal view. No evident fracture or dislocation.  LABS REVIEWED:   TODAY:   09-27-16: free T4: 0.72 02-03-17: hgb a1c 6.0 02-07-17: wbc 16.3; hgb 11.6; hct 34.9; mcv 95.6; plt 223; glucose 171; bun 8; creat 0.75; k+ 4.0; na++ 134; ca 8.5    Review of Systems  Constitutional: Negative for malaise/fatigue.  Respiratory: Positive for cough, shortness of breath and wheezing.   Cardiovascular: Negative for chest pain, palpitations and leg swelling.  Gastrointestinal: Positive for heartburn. Negative for abdominal pain and constipation.  Musculoskeletal: Positive for joint pain. Negative for back pain and myalgias.       Right hip pain   Skin:       Has right hip incision line   Neurological: Negative for dizziness.  Psychiatric/Behavioral: The patient has insomnia. The patient is not nervous/anxious.     Physical Exam  Constitutional: She is oriented to person, place, and time. She appears well-developed and well-nourished. No distress.  Obese   Neck: Neck supple. No thyromegaly present.  Cardiovascular: Normal rate, regular rhythm, normal heart sounds and intact  distal pulses.  Pulmonary/Chest: She has wheezes.  Has increased effort Has "rubber band" feeling around chest  Abdominal: Soft. Bowel sounds are normal. She exhibits no distension.  Musculoskeletal: She exhibits no edema.  Is able to move all extremities   Lymphadenopathy:    She has no cervical adenopathy.  Neurological: She is alert and oriented to person, place, and time.  Skin: Skin is warm and dry. She is not diaphoretic.  Dressing to right hip incision line without drainage present no signs of infection present.   Psychiatric: She has a normal mood and affect.     ASSESSMENT/ PLAN:  TODAY:   1. Hypertension: stable b/p 124/62: will continue tenormin 25 mg daily   2. COPD: severe; has chronic respiratory failure with hypoxia: is worse: will continue spiriva 18 mcg daily; symbicort 160/4.5 mcg 2 puffs twice daily singular 10 mg daily daliresp 500 mcg daily will stop albuterol and will begin duoneb every 6 hours routinely for 5 days then every 6 hours as needed; will begin mucinex 1200 mg twice daily    3. OSA treated with BiPAP: stable does use nightly will monitor  4. Hypothyroidism due to acquired atrophy: stable free T 4: 0.72; will continue synthroid 25 mcg daily   5. Dyslipidemia: stable will continue lipitor 20 mg daily   6. Anemia: stable hgb 11.6 will continue iron twice daily   7. Constipation: stable will continue colace twice daily and miralax daily as needed  8. Smoker: without change will continue chantix 1 mg twice daily   9. Insomnia: without change will continue benadryl 25 mg nightly   10. Osteoarthritis: stable  is status post right hip replacement: will follow up with orthopedics as indicated; will continue therapy as directed; will continue robaxin 500 mg every 6 hours as needed and oxycodone 5-15 mg every 3 hours as needed  11. Depression with anxiety: stable will continue effexor xl 150 mg daily   12. Fever blisters: stable will continue valtrex 500  mg daily for preventative  13. GERD: is worse: will begin nexium 40 mg daily and will monitor  Will repeat cbc      MD is aware of resident's narcotic use and is in agreement with current plan of care. We will attempt to wean resident as apropriate   Ok Edwards NP [facility]  Contact [phone] Monday through Friday 8am- 5pm  After hours call [phone]

## 2017-02-11 LAB — CBC AND DIFFERENTIAL
HCT: 28 — AB (ref 36–46)
Hemoglobin: 9.5 — AB (ref 12.0–16.0)
Neutrophils Absolute: 5
Platelets: 271 (ref 150–399)
WBC: 8.1

## 2017-02-13 ENCOUNTER — Encounter: Payer: Self-pay | Admitting: Internal Medicine

## 2017-02-13 ENCOUNTER — Non-Acute Institutional Stay (SKILLED_NURSING_FACILITY): Payer: Medicare HMO | Admitting: Internal Medicine

## 2017-02-13 DIAGNOSIS — D508 Other iron deficiency anemias: Secondary | ICD-10-CM

## 2017-02-13 DIAGNOSIS — G4733 Obstructive sleep apnea (adult) (pediatric): Secondary | ICD-10-CM

## 2017-02-13 DIAGNOSIS — J449 Chronic obstructive pulmonary disease, unspecified: Secondary | ICD-10-CM

## 2017-02-13 DIAGNOSIS — R7303 Prediabetes: Secondary | ICD-10-CM

## 2017-02-13 DIAGNOSIS — M150 Primary generalized (osteo)arthritis: Secondary | ICD-10-CM

## 2017-02-13 DIAGNOSIS — M79604 Pain in right leg: Secondary | ICD-10-CM | POA: Diagnosis not present

## 2017-02-13 DIAGNOSIS — E034 Atrophy of thyroid (acquired): Secondary | ICD-10-CM

## 2017-02-13 DIAGNOSIS — M8949 Other hypertrophic osteoarthropathy, multiple sites: Secondary | ICD-10-CM

## 2017-02-13 DIAGNOSIS — I10 Essential (primary) hypertension: Secondary | ICD-10-CM | POA: Diagnosis not present

## 2017-02-13 DIAGNOSIS — Z96641 Presence of right artificial hip joint: Secondary | ICD-10-CM | POA: Diagnosis not present

## 2017-02-13 DIAGNOSIS — R2689 Other abnormalities of gait and mobility: Secondary | ICD-10-CM | POA: Diagnosis not present

## 2017-02-13 DIAGNOSIS — M159 Polyosteoarthritis, unspecified: Secondary | ICD-10-CM

## 2017-02-13 NOTE — Progress Notes (Signed)
Patient ID: Joan [name], female   DOB: Apr 06, 1960, 56 y.o.   MRN: [id number]  Provider:  DR Elmon Kirschner Location:  [facility] Room Number: 101 A Place of Service:  SNF (31)  PCP: Myrlene Broker, MD Patient Care Team: Myrlene Broker, MD as PCP - General (Internal Medicine)  Extended Emergency Contact Information Primary Emergency Contact: [facility] Address: [address]          [place], [place] [address] [place] Home Phone: [phone] Relation: Brother  Code Status: FULL CODE Goals of Care: Advanced Directive information Advanced Directives 02/13/2017  Does Patient Have a Medical Advance Directive? No  Type of Advance Directive -  Does patient want to make changes to medical advance directive? No - Patient declined  Copy of Healthcare Power of Attorney in Chart? -  Would patient like information on creating a medical advance directive? -      Chief Complaint  Patient presents with  . New Admit To SNF    New Admission to [facility]  . Advanced Directive    Full Code    HPI: Patient is a 56 y.o. female seen today for admission to SNF following hospital stay for right hip OA and chronic RLE pain. She underwent uncomplicated total right hip arthroplasty. Hgb 11.6; A1c 6% at d/c. She presents to SNF for short term rehab.  HTN - BP stable on tenormin 25 mg daily   COPD/chronic bronchitis - severe; chronic respiratory failure with hypoxia; takes spiriva 18 mcg daily; symbicort 160/4.5 mcg 2 puffs twice daily; singular 10 mg daily; daliresp 500 mcg daily; duoneb every 6 hours as needed; mucinex 1200 mg twice daily    OSA  - stable on BiPAP qhs   Hypothyroidism due to acquired atrophy - controlled on synthroid;  TSH 0.107; free T4 level 0.72  Dyslipidemia - stable on lipitor 20 mg daily   Hx Anemia - stable on iron twice daily. Hgb 11.6   Constipation - stable on colace twice daily and miralax daily as  needed  Chronic tobacco abuse - stable on chantix 1 mg twice daily   Osteoarthritis/chronic pain - stable on robaxin 500 mg every 6 hours as needed and oxycodone 5-15 mg every 3 hours as needed; followed by Ortho  Depression with anxiety - mood stable oneffexor xl 150 mg daily; takes benadryl at night to help sleep  Oral herpes - stable on valtrex 500 mg daily for preventative  GERD - improved on nexium 40 mg daily   Past Medical History:  Diagnosis Date  . Alcoholism and drug addiction in family   . Anxiety   . Arthritis    "knees, hips, lower back" (09/25/2016)  . Asthma   . Bipolar disorder (HCC)   . Chronic bronchitis (HCC)    "all the time" (09/25/2016)  . Chronic leg pain    RLE  . Chronic lower back pain   . COPD (chronic obstructive pulmonary disease) (HCC)   . Depression   . Emphysema of lung (HCC)   . Hepatitis C    "treated back in 2001-2002"  . High cholesterol   . Hypertension   . Hypothyroidism   . OSA on CPAP   . Pneumonia    "all the time" (09/25/2016)  . Thyroid disease   . Type II diabetes mellitus (HCC)    Past Surgical History:  Procedure Laterality Date  . BACK SURGERY    . CARDIAC CATHETERIZATION    .  COLONOSCOPY    . CYST EXCISION     removal of cyst in sinuses  . DILATION AND CURETTAGE OF UTERUS    . FOOT SURGERY Bilateral    bone removed from 4th and 5 th toes and put in pin then took pin out  . HERNIA REPAIR    . INCISION AND DRAINAGE  ~ 2014/2015   "removed mesh & infection"  . LACERATION REPAIR Right    repair wrist artery from laceration from ice skate  . LIPOMA EXCISION Right    fattty lipoma in neck  . NASAL SEPTUM SURGERY    . POSTERIOR LUMBAR FUSION  2015/2016 X 2   "added rods and screws"  . TOTAL HIP ARTHROPLASTY Right 02/05/2017   Procedure: RIGHT TOTAL HIP ARTHROPLASTY;  Surgeon: Ranee Gosselin, MD;  Location: [facility];  Service: Orthopedics;  Laterality: Right;  . TUBAL LIGATION    . UMBILICAL HERNIA REPAIR  ~ 2013    w/mesh  . UPPER GI ENDOSCOPY      reports that she quit smoking about 2 months ago. Her smoking use included cigarettes. She has a 44.00 pack-year smoking history. she has never used smokeless tobacco. She reports that she drinks alcohol. She reports that she uses drugs. Drug: "Crack" cocaine. Social History   Socioeconomic History  . Marital status: Widowed    Spouse name: Not on file  . Number of children: Not on file  . Years of education: Not on file  . Highest education level: Not on file  Social Needs  . Financial resource strain: Not on file  . Food insecurity - worry: Not on file  . Food insecurity - inability: Not on file  . Transportation needs - medical: Not on file  . Transportation needs - non-medical: Not on file  Occupational History  . Not on file  Tobacco Use  . Smoking status: Former Smoker    Packs/day: 1.00    Years: 44.00    Pack years: 44.00    Types: Cigarettes    Last attempt to quit: 11/22/2016    Years since quitting: 0.2  . Smokeless tobacco: Never Used  . Tobacco comment: pt quit smoking 11/22/16 and currently taking chantix  ep  Substance and Sexual Activity  . Alcohol use: Yes    Comment: 09/25/2016 "stopped a long time ago"  . Drug use: Yes    Types: "Crack" cocaine    Comment: 09/25/2016 "stopped 5 yr ago"  . Sexual activity: No  Other Topics Concern  . Not on file  Social History Narrative  . Not on file    Functional Status Survey:    Family History  Problem Relation Age of Onset  . Diabetes Mother   . Hypertension Mother   . Hypertension Father   . Cancer Father        lung    Health Maintenance  Topic Date Due  . PNEUMOCOCCAL POLYSACCHARIDE VACCINE (1) 02/10/2018 (Originally 11/02/1963)  . MAMMOGRAM  02/10/2018 (Originally 11/02/2011)  . PAP SMEAR  02/10/2018 (Originally 11/02/1982)  . OPHTHALMOLOGY EXAM  02/10/2018 (Originally 11/02/1971)  . URINE MICROALBUMIN  02/10/2018 (Originally 11/02/1971)  . COLONOSCOPY  02/10/2018 (Originally  11/02/2011)  . TETANUS/TDAP  02/10/2018 (Originally 11/01/1980)  . HEMOGLOBIN A1C  08/04/2017  . FOOT EXAM  11/29/2017  . INFLUENZA VACCINE  Completed  . Hepatitis C Screening  Completed  . HIV Screening  Completed    Allergies  Allergen Reactions  . Dilaudid [Hydromorphone Hcl] Other (See Comments)    "  heavy sedation"  . Keflex [Cephalexin] Other (See Comments)    unspecified  . Prednisone Other (See Comments)    "counter reacts"  . Shellfish Allergy Other (See Comments)    Stomach cramps  . Tetracyclines & Related Other (See Comments)    unspecified    Outpatient Encounter Medications as of 02/13/2017  Medication Sig  . albuterol (PROVENTIL HFA;VENTOLIN HFA) 108 (90 Base) MCG/ACT inhaler Inhale 2 puffs into the lungs every 6 (six) hours as needed for wheezing or shortness of breath (before activity).  Marland Kitchen albuterol (PROVENTIL) (2.5 MG/3ML) 0.083% nebulizer solution Take 3 mLs (2.5 mg total) by nebulization every 6 (six) hours as needed for wheezing or shortness of breath.  Marland Kitchen aspirin EC 325 MG tablet Take 1 tablet (325 mg total) by mouth 2 (two) times daily.  Marland Kitchen atenolol (TENORMIN) 25 MG tablet Take 1 tablet (25 mg total) by mouth daily.  Marland Kitchen atorvastatin (LIPITOR) 20 MG tablet Take 20 mg by mouth daily.  . budesonide-formoterol (SYMBICORT) 160-4.5 MCG/ACT inhaler Inhale 2 puffs into the lungs 2 (two) times daily.  . diclofenac sodium (VOLTAREN) 1 % GEL Apply 4 g topically 3 (three) times daily.  . diphenhydrAMINE (BENADRYL) 25 mg capsule Take 25 mg by mouth at bedtime.  . docusate sodium (COLACE) 100 MG capsule Take 1 capsule (100 mg total) by mouth 2 (two) times daily.  . ferrous sulfate 325 (65 FE) MG tablet Take 1 tablet (325 mg total) by mouth 2 (two) times daily with a meal.  . ipratropium-albuterol (DUONEB) 0.5-2.5 (3) MG/3ML SOLN Take 3 mLs by nebulization every 6 (six) hours as needed.  Marland Kitchen levothyroxine (SYNTHROID, LEVOTHROID) 25 MCG tablet Take 1 tablet (25 mcg total) by mouth  daily before breakfast.  . montelukast (SINGULAIR) 10 MG tablet Take 10 mg by mouth at bedtime.  . naproxen (NAPROSYN) 500 MG tablet Take 500 mg by mouth every 6 (six) hours as needed.   Marland Kitchen omeprazole (PRILOSEC) 20 MG capsule Take 20 mg by mouth daily.  Marland Kitchen oxyCODONE (OXY IR/ROXICODONE) 5 MG immediate release tablet Take 1-3 tablets (5-15 mg total) by mouth every 3 (three) hours as needed for severe pain ((score 7 to 10)).  Marland Kitchen polyethylene glycol (MIRALAX / GLYCOLAX) packet Take 17 g by mouth daily as needed for mild constipation.  . pregabalin (LYRICA) 75 MG capsule Take 1 capsule (75 mg total) 2 (two) times daily by mouth.  . roflumilast (DALIRESP) 500 MCG TABS tablet Take 1 tablet (500 mcg total) by mouth daily.  Marland Kitchen tiotropium (SPIRIVA) 18 MCG inhalation capsule Place 1 capsule (18 mcg total) into inhaler and inhale daily.  . valACYclovir (VALTREX) 500 MG tablet Take 500 mg by mouth daily.   . varenicline (CHANTIX CONTINUING MONTH PAK) 1 MG tablet Take 1 tablet (1 mg total) by mouth 2 (two) times daily.  Marland Kitchen venlafaxine XR (EFFEXOR-XR) 150 MG 24 hr capsule Take 1 capsule (150 mg total) by mouth daily with breakfast.  . methocarbamol (ROBAXIN) 500 MG tablet Take 1 tablet (500 mg total) by mouth every 6 (six) hours as needed for muscle spasms.   No facility-administered encounter medications on file as of 02/13/2017.     Review of Systems  Constitutional: Positive for fatigue.  Respiratory: Positive for shortness of breath.   Musculoskeletal: Positive for arthralgias.  All other systems reviewed and are negative.   Vitals:   02/13/17 1039  BP: 131/63  Pulse: 75  Resp: 18  Temp: 97.6 F (36.4 C)  TempSrc: Oral  SpO2: 97%  Weight: 226 lb 14.4 oz (102.9 kg)  Height: 5\' 7"  (1.702 m)   Body mass index is 35.54 kg/m. Physical Exam  Constitutional: She is oriented to person, place, and time. She appears well-developed and well-nourished.  Lying in bed in NAD, voice is hoarse  HENT:   Mouth/Throat: Oropharynx is clear and moist. No oropharyngeal exudate.  MMM; no oral thrush  Eyes: Pupils are equal, round, and reactive to light. No scleral icterus.  Neck: Neck supple. Carotid bruit is not present. No tracheal deviation present. No thyromegaly present.  Cardiovascular: Normal rate, regular rhythm and intact distal pulses. Exam reveals no gallop and no friction rub.  Murmur (1/6 SEM) heard. No LE edema b/l. no calf TTP.   Pulmonary/Chest: Effort normal. No stridor. No respiratory distress. She has wheezes (end expiratory with prolonged expiratory phase). She has no rales.  Abdominal: Soft. Normal appearance and bowel sounds are normal. She exhibits no distension and no mass. There is no hepatomegaly. There is no tenderness. There is no rigidity, no rebound and no guarding. No hernia.  obese  Musculoskeletal: She exhibits edema (right knee) and tenderness.  Reduced ROM right knee; right lateral thigh dsg dirty  Lymphadenopathy:    She has no cervical adenopathy.  Neurological: She is alert and oriented to person, place, and time.  Skin: Skin is warm and dry. No rash noted.  Psychiatric: She has a normal mood and affect. Her behavior is normal. Judgment and thought content normal.    Labs reviewed: Basic Metabolic Panel: Recent Labs    02/03/17 1201 02/06/17 0601 02/07/17 0609  NA 138 136 134*  K 4.2 3.5 4.0  CL 102 98* 98*  CO2 27 28 26   GLUCOSE 132* 165* 171*  BUN 20 14 8   CREATININE 0.99 0.89 0.75  CALCIUM 9.5 8.9 8.5*   Liver Function Tests: Recent Labs    08/09/16 1202 02/03/17 1201  AST 16 27  ALT 19 34  ALKPHOS 107 135*  BILITOT 0.6 0.5  PROT 6.7 7.3  ALBUMIN 3.6 4.3   No results for input(s): LIPASE, AMYLASE in the last 8760 hours. No results for input(s): AMMONIA in the last 8760 hours. CBC: Recent Labs    08/09/16 1202  02/03/17 1201 02/06/17 0601 02/07/17 0609 02/11/17  WBC 8.5   < > 12.2* 12.8* 16.3* 8.1  NEUTROABS 5.1  --  7.9*   --   --  5  HGB 13.6   < > 14.0 12.9 11.6* 9.5*  HCT 40.9   < > 40.9 37.8 34.9* 28*  MCV 97.8   < > 94.2 94.5 95.6  --   PLT 329   < > 259 208 223 271   < > = values in this interval not displayed.   Cardiac Enzymes: No results for input(s): CKTOTAL, CKMB, CKMBINDEX, TROPONINI in the last 8760 hours. BNP: Invalid input(s): POCBNP Lab Results  Component Value Date   HGBA1C 6.0 (H) 02/03/2017   Lab Results  Component Value Date   TSH 0.107 (L) 09/26/2016   No results found for: VITAMINB12 No results found for: FOLATE No results found for: IRON, TIBC, FERRITIN  Imaging and Procedures obtained prior to SNF admission: Dg Chest 2 View  Result Date: 02/03/2017 CLINICAL DATA:  Preop hip surgery. EXAM: CHEST  2 VIEW COMPARISON:  CT chest 12/03/2016 FINDINGS: The lungs are hyperinflated likely secondary to COPD. There is no focal parenchymal opacity. There is no pleural effusion or pneumothorax. The heart and  mediastinal contours are unremarkable. The osseous structures are unremarkable. IMPRESSION: No active cardiopulmonary disease. Emphysema. ([id number]) Electronically Signed   By: Elige Ko   On: 02/03/2017 12:37    Assessment/Plan   ICD-10-CM   1. COPD, severe (HCC) J44.9    2/2 chronic bronchitis  2. H/O total hip arthroplasty, right Z96.641   3. Essential hypertension I10   4. Primary osteoarthritis involving multiple joints M15.0   5. OSA treated with BiPAP G47.33   6. Iron deficiency anemia due to dietary causes D50.8   7. Hypothyroidism due to acquired atrophy of thyroid E03.4   8. Prediabetes R73.03     F/u with Ortho as scheduled in the AM  cont current meds as ordered  PT/OT/ST as ordered  Wound care as ordered  GOAL: short term rehab and d/c home when medically appropriate. Communicated with pt and nursing.  Labs/tests ordered: follow CBC  Elynn Patteson S. Ancil Linsey  [facility] [address] [place], [place] [address] [phone] Cell (Monday-Friday 8 AM - 5 PM) [phone] After 5 PM and follow prompts

## 2017-02-14 DIAGNOSIS — M79604 Pain in right leg: Secondary | ICD-10-CM | POA: Diagnosis not present

## 2017-02-14 DIAGNOSIS — Z471 Aftercare following joint replacement surgery: Secondary | ICD-10-CM | POA: Diagnosis not present

## 2017-02-14 DIAGNOSIS — R2689 Other abnormalities of gait and mobility: Secondary | ICD-10-CM | POA: Diagnosis not present

## 2017-02-14 DIAGNOSIS — Z96641 Presence of right artificial hip joint: Secondary | ICD-10-CM | POA: Diagnosis not present

## 2017-02-19 ENCOUNTER — Non-Acute Institutional Stay (SKILLED_NURSING_FACILITY): Payer: Medicare HMO | Admitting: Adult Health

## 2017-02-19 ENCOUNTER — Encounter: Payer: Self-pay | Admitting: Adult Health

## 2017-02-19 ENCOUNTER — Other Ambulatory Visit: Payer: Self-pay

## 2017-02-19 DIAGNOSIS — D508 Other iron deficiency anemias: Secondary | ICD-10-CM | POA: Diagnosis not present

## 2017-02-19 DIAGNOSIS — R2689 Other abnormalities of gait and mobility: Secondary | ICD-10-CM | POA: Diagnosis not present

## 2017-02-19 DIAGNOSIS — M79604 Pain in right leg: Secondary | ICD-10-CM | POA: Diagnosis not present

## 2017-02-19 MED ORDER — OXYCODONE-ACETAMINOPHEN 10-325 MG PO TABS: 1.0000 | ORAL_TABLET | Freq: Four times a day (QID) | ORAL | 0 refills | Status: DC | PRN

## 2017-02-19 MED ORDER — PREGABALIN 75 MG PO CAPS: 75.0000 mg | ORAL_CAPSULE | Freq: Two times a day (BID) | ORAL | 0 refills | Status: DC

## 2017-02-19 NOTE — Progress Notes (Signed)
Location:   [place] [facility] Room Number: 101 A Place of Service:  SNF (31)   CODE STATUS: Full Code  Allergies  Allergen Reactions  . Dilaudid [Hydromorphone Hcl] Other (See Comments)    "heavy sedation"  . Keflex [Cephalexin] Other (See Comments)    unspecified  . Prednisone Other (See Comments)    "counter reacts"  . Shellfish Allergy Other (See Comments)    Stomach cramps  . Tetracyclines & Related Other (See Comments)    unspecified    Chief Complaint  Patient presents with  . Acute Visit    Declined meds    HPI:  She is not wanting to take her iron tabs; telling me that she is having diarrhea from her medication. She is planning on going home either late this week or early next. She tells me that she is feeling much better is able to ambulate with a walker. She is taking all other medications. There are no other nursing concerns at this time.   Past Medical History:  Diagnosis Date  . Alcoholism and drug addiction in family   . Anxiety   . Arthritis    "knees, hips, lower back" (09/25/2016)  . Asthma   . Bipolar disorder ([place])   . Chronic bronchitis ([place])    "all the time" (09/25/2016)  . Chronic leg pain    RLE  . Chronic lower back pain   . COPD (chronic obstructive pulmonary disease) ([place])   . Depression   . Emphysema of lung ([place])   . Hepatitis C    "treated back in 2001-2002"  . High cholesterol   . Hypertension   . Hypothyroidism   . OSA on CPAP   . Pneumonia    "all the time" (09/25/2016)  . Thyroid disease   . Type II diabetes mellitus ([place])     Past Surgical History:  Procedure Laterality Date  . BACK SURGERY    . CARDIAC CATHETERIZATION    . COLONOSCOPY    . CYST EXCISION     removal of cyst in sinuses  . DILATION AND CURETTAGE OF UTERUS    . FOOT SURGERY Bilateral    bone removed from 4th and 5 th toes and put in pin then took pin out  . HERNIA REPAIR    . INCISION AND DRAINAGE  ~ 2014/2015   "removed mesh & infection"  .  LACERATION REPAIR Right    repair wrist artery from laceration from ice skate  . LIPOMA EXCISION Right    fattty lipoma in neck  . NASAL SEPTUM SURGERY    . POSTERIOR LUMBAR FUSION  2015/2016 X 2   "added rods and screws"  . TOTAL HIP ARTHROPLASTY Right 02/05/2017   Procedure: RIGHT TOTAL HIP ARTHROPLASTY;  Surgeon: [name], MD;  Location: [facility];  Service: Orthopedics;  Laterality: Right;  . TUBAL LIGATION    . UMBILICAL HERNIA REPAIR  ~ 2013   w/mesh  . UPPER GI ENDOSCOPY      Social History   Socioeconomic History  . Marital status: Widowed    Spouse name: Not on file  . Number of children: Not on file  . Years of education: Not on file  . Highest education level: Not on file  Social Needs  . Financial resource strain: Not on file  . Food insecurity - worry: Not on file  . Food insecurity - inability: Not on file  . Transportation needs - medical: Not on file  . Transportation needs -  non-medical: Not on file  Occupational History  . Not on file  Tobacco Use  . Smoking status: Former Smoker    Packs/day: 1.00    Years: 44.00    Pack years: 44.00    Types: Cigarettes    Last attempt to quit: 11/22/2016    Years since quitting: 0.2  . Smokeless tobacco: Never Used  . Tobacco comment: pt quit smoking 11/22/16 and currently taking chantix  ep  Substance and Sexual Activity  . Alcohol use: Yes    Comment: 09/25/2016 "stopped a long time ago"  . Drug use: Yes    Types: "Crack" cocaine    Comment: 09/25/2016 "stopped 5 yr ago"  . Sexual activity: No  Other Topics Concern  . Not on file  Social History Narrative  . Not on file   Family History  Problem Relation Age of Onset  . Diabetes Mother   . Hypertension Mother   . Hypertension Father   . Cancer Father        lung      VITAL SIGNS BP 131/63   Pulse 75   Temp 97.6 F (36.4 C)   Resp 18   Ht 5\' 7"  (1.702 m)   Wt 226 lb 14.4 oz (102.9 kg)   SpO2 96%   BMI 35.54 kg/m   Outpatient Encounter  Medications as of 02/19/2017  Medication Sig  . albuterol (PROVENTIL HFA;VENTOLIN HFA) 108 (90 Base) MCG/ACT inhaler Inhale 2 puffs into the lungs every 6 (six) hours as needed for wheezing or shortness of breath (before activity).  Marland Kitchen aspirin EC 325 MG tablet Take 1 tablet (325 mg total) by mouth 2 (two) times daily.  Marland Kitchen atenolol (TENORMIN) 25 MG tablet Take 1 tablet (25 mg total) by mouth daily.  Marland Kitchen atorvastatin (LIPITOR) 20 MG tablet Take 20 mg by mouth daily.  . budesonide-formoterol (SYMBICORT) 160-4.5 MCG/ACT inhaler Inhale 2 puffs into the lungs 2 (two) times daily.  . diphenhydrAMINE (BENADRYL) 25 mg capsule Take 25 mg by mouth at bedtime.  . docusate sodium (COLACE) 100 MG capsule Take 1 capsule (100 mg total) by mouth 2 (two) times daily.  . ferrous sulfate 325 (65 FE) MG tablet Take 1 tablet (325 mg total) by mouth 2 (two) times daily with a meal.  . guaiFENesin (MUCINEX) 600 MG 12 hr tablet Take 1,200 mg by mouth 2 (two) times daily.  Marland Kitchen ipratropium-albuterol (DUONEB) 0.5-2.5 (3) MG/3ML SOLN Take 3 mLs by nebulization every 6 (six) hours as needed.  Marland Kitchen levothyroxine (SYNTHROID, LEVOTHROID) 25 MCG tablet Take 1 tablet (25 mcg total) by mouth daily before breakfast.  . lidocaine (LIDODERM) 5 % Apply to right knee topically once daily.  Remove & Discard patch within 12 hours or as directed by MD  . methocarbamol (ROBAXIN) 500 MG tablet Take 1 tablet (500 mg total) by mouth every 6 (six) hours as needed for muscle spasms.  . montelukast (SINGULAIR) 10 MG tablet Take 10 mg by mouth at bedtime.  . naproxen (NAPROSYN) 500 MG tablet Take 500 mg by mouth every 6 (six) hours as needed.   Marland Kitchen omeprazole (PRILOSEC) 20 MG capsule Take 20 mg by mouth daily.  Marland Kitchen oxyCODONE-acetaminophen (PERCOCET) 10-325 MG tablet Take 1 tablet by mouth every 6 (six) hours as needed for pain.  . polyethylene glycol (MIRALAX / GLYCOLAX) packet Take 17 g by mouth daily as needed for mild constipation.  . pregabalin (LYRICA) 75  MG capsule Take 1 capsule (75 mg total) 2 (two) times  daily by mouth.  . roflumilast (DALIRESP) 500 MCG TABS tablet Take 1 tablet (500 mcg total) by mouth daily.  Marland Kitchen tiotropium (SPIRIVA) 18 MCG inhalation capsule Place 1 capsule (18 mcg total) into inhaler and inhale daily.  . valACYclovir (VALTREX) 500 MG tablet Take 500 mg by mouth daily.   . varenicline (CHANTIX CONTINUING MONTH PAK) 1 MG tablet Take 1 tablet (1 mg total) by mouth 2 (two) times daily.  Marland Kitchen venlafaxine XR (EFFEXOR-XR) 150 MG 24 hr capsule Take 1 capsule (150 mg total) by mouth daily with breakfast.  . [DISCONTINUED] albuterol (PROVENTIL) (2.5 MG/3ML) 0.083% nebulizer solution Take 3 mLs (2.5 mg total) by nebulization every 6 (six) hours as needed for wheezing or shortness of breath. (Patient not taking: Reported on 02/19/2017)  . [DISCONTINUED] diclofenac sodium (VOLTAREN) 1 % GEL Apply 4 g topically 3 (three) times daily.  . [DISCONTINUED] oxyCODONE (OXY IR/ROXICODONE) 5 MG immediate release tablet Take 1-3 tablets (5-15 mg total) by mouth every 3 (three) hours as needed for severe pain ((score 7 to 10)). (Patient not taking: Reported on 02/19/2017)   No facility-administered encounter medications on file as of 02/19/2017.      SIGNIFICANT DIAGNOSTIC EXAMS  PREVIOUS:   02-06-17: right hip x-ray:  No appreciable change from 1 day prior. Prosthetic components appear well seated on frontal view. No evident fracture or dislocation.  NO NEW EXAMS  LABS REVIEWED PREVIOUS:   09-27-16: free T4: 0.72 02-03-17: hgb a1c 6.0 02-07-17: wbc 16.3; hgb 11.6; hct 34.9; mcv 95.6; plt 223; glucose 171; bun 8; creat 0.75; k+ 4.0; na++ 134; ca 8.5  NO NEW LABS     Review of Systems  Constitutional: Negative for malaise/fatigue.  Respiratory: Positive for cough and shortness of breath.        Is chronic   Cardiovascular: Negative for chest pain, palpitations and leg swelling.  Gastrointestinal: Negative for abdominal pain, constipation  and heartburn.  Musculoskeletal: Negative for back pain, joint pain and myalgias.  Skin: Negative.   Neurological: Negative for dizziness.  Psychiatric/Behavioral: The patient is not nervous/anxious.      Physical Exam  Constitutional: She is oriented to person, place, and time. She appears well-developed and well-nourished. No distress.  Obese   Neck: No thyromegaly present.  Cardiovascular: Normal rate, regular rhythm, normal heart sounds and intact distal pulses.  Pulmonary/Chest: Effort normal and breath sounds normal. No respiratory distress.  Abdominal: Soft. Bowel sounds are normal. She exhibits no distension. There is no tenderness.  Musculoskeletal: Normal range of motion. She exhibits no edema.  Lymphadenopathy:    She has no cervical adenopathy.  Neurological: She is alert and oriented to person, place, and time.  Skin: Skin is warm and dry. She is not diaphoretic.  Incision line without signs of infection present   Psychiatric: She has a normal mood and affect.    ASSESSMENT/ PLAN:  TODAY:   1. Anemia: stable hgb 11.6 will stop iron due to intolerance     MD is aware of resident's narcotic use and is in agreement with current plan of care. We will attempt to wean resident as apropriate     Ok Edwards NP [facility]  Contact [phone] Monday through Friday 8am- 5pm  After hours call [phone]

## 2017-02-19 NOTE — Telephone Encounter (Signed)
Discharged with patient on 02/21/17

## 2017-02-20 ENCOUNTER — Encounter: Payer: Self-pay | Admitting: Adult Health

## 2017-02-20 ENCOUNTER — Non-Acute Institutional Stay (SKILLED_NURSING_FACILITY): Payer: Medicare HMO | Admitting: Adult Health

## 2017-02-20 DIAGNOSIS — J9611 Chronic respiratory failure with hypoxia: Secondary | ICD-10-CM

## 2017-02-20 DIAGNOSIS — M150 Primary generalized (osteo)arthritis: Secondary | ICD-10-CM

## 2017-02-20 DIAGNOSIS — M159 Polyosteoarthritis, unspecified: Secondary | ICD-10-CM

## 2017-02-20 DIAGNOSIS — Z96641 Presence of right artificial hip joint: Secondary | ICD-10-CM | POA: Diagnosis not present

## 2017-02-20 DIAGNOSIS — M8949 Other hypertrophic osteoarthropathy, multiple sites: Secondary | ICD-10-CM

## 2017-02-20 NOTE — Progress Notes (Signed)
Location:   [facility] Room Number: 101 A Place of Service:  SNF (31)    CODE STATUS: Full Code  Allergies  Allergen Reactions  . Dilaudid [Hydromorphone Hcl] Other (See Comments)    "heavy sedation"  . Keflex [Cephalexin] Other (See Comments)    unspecified  . Prednisone Other (See Comments)    "counter reacts"  . Shellfish Allergy Other (See Comments)    Stomach cramps  . Tetracyclines & Related Other (See Comments)    unspecified    Chief Complaint  Patient presents with  . Discharge Note    Discharging to home on 02/21/17    HPI:  She is being discharged to home with home health for pt/ot/rn/cna/sw. She will need a front wheel walker with skis; tub transfer bench; reacher and sock aid. She will need to follow up with her medical provider and will need her prescriptions written.  She had been hospitalized for a right hip replacement. She was admitted to this facility for short term rehab and is now ready for discharge to home.    Past Medical History:  Diagnosis Date  . Alcoholism and drug addiction in family   . Anxiety   . Arthritis    "knees, hips, lower back" (09/25/2016)  . Asthma   . Bipolar disorder ([place])   . Chronic bronchitis ([place])    "all the time" (09/25/2016)  . Chronic leg pain    RLE  . Chronic lower back pain   . COPD (chronic obstructive pulmonary disease) ([place])   . Depression   . Emphysema of lung ([place])   . Hepatitis C    "treated back in 2001-2002"  . High cholesterol   . Hypertension   . Hypothyroidism   . OSA on CPAP   . Pneumonia    "all the time" (09/25/2016)  . Thyroid disease   . Type II diabetes mellitus ([place])     Past Surgical History:  Procedure Laterality Date  . BACK SURGERY    . CARDIAC CATHETERIZATION    . COLONOSCOPY    . CYST EXCISION     removal of cyst in sinuses  . DILATION AND CURETTAGE OF UTERUS    . FOOT SURGERY Bilateral    bone removed from 4th and 5 th toes and put in pin then took pin out  .  HERNIA REPAIR    . INCISION AND DRAINAGE  ~ 2014/2015   "removed mesh & infection"  . LACERATION REPAIR Right    repair wrist artery from laceration from ice skate  . LIPOMA EXCISION Right    fattty lipoma in neck  . NASAL SEPTUM SURGERY    . POSTERIOR LUMBAR FUSION  2015/2016 X 2   "added rods and screws"  . TOTAL HIP ARTHROPLASTY Right 02/05/2017   Procedure: RIGHT TOTAL HIP ARTHROPLASTY;  Surgeon: [name], MD;  Location: [facility];  Service: Orthopedics;  Laterality: Right;  . TUBAL LIGATION    . UMBILICAL HERNIA REPAIR  ~ 2013   w/mesh  . UPPER GI ENDOSCOPY      Social History   Socioeconomic History  . Marital status: Widowed    Spouse name: Not on file  . Number of children: Not on file  . Years of education: Not on file  . Highest education level: Not on file  Social Needs  . Financial resource strain: Not on file  . Food insecurity - worry: Not on file  . Food insecurity - inability: Not on file  .  Transportation needs - medical: Not on file  . Transportation needs - non-medical: Not on file  Occupational History  . Not on file  Tobacco Use  . Smoking status: Former Smoker    Packs/day: 1.00    Years: 44.00    Pack years: 44.00    Types: Cigarettes    Last attempt to quit: 11/22/2016    Years since quitting: 0.2  . Smokeless tobacco: Never Used  . Tobacco comment: pt quit smoking 11/22/16 and currently taking chantix  ep  Substance and Sexual Activity  . Alcohol use: Yes    Comment: 09/25/2016 "stopped a long time ago"  . Drug use: Yes    Types: "Crack" cocaine    Comment: 09/25/2016 "stopped 5 yr ago"  . Sexual activity: No  Other Topics Concern  . Not on file  Social History Narrative  . Not on file   Family History  Problem Relation Age of Onset  . Diabetes Mother   . Hypertension Mother   . Hypertension Father   . Cancer Father        lung    VITAL SIGNS BP 130/60   Pulse 74   Temp 98.2 F (36.8 C)   Resp 20   Ht 5\' 7"  (1.702 m)   Wt  226 lb 14.4 oz (102.9 kg)   SpO2 95%   BMI 35.54 kg/m      Medication List        Accurate as of 02/20/17 10:16 AM. Always use your most recent med list.          albuterol 108 (90 Base) MCG/ACT inhaler Commonly known as:  PROVENTIL HFA;VENTOLIN HFA   aspirin EC 325 MG tablet Take 1 tablet (325 mg total) by mouth 2 (two) times daily.   atenolol 25 MG tablet Commonly known as:  TENORMIN Take 1 tablet (25 mg total) by mouth daily.   atorvastatin 20 MG tablet Commonly known as:  LIPITOR   budesonide-formoterol 160-4.5 MCG/ACT inhaler Commonly known as:  SYMBICORT Inhale 2 puffs into the lungs 2 (two) times daily.   diphenhydrAMINE 25 mg capsule Commonly known as:  BENADRYL   docusate sodium 100 MG capsule Commonly known as:  COLACE Take 1 capsule (100 mg total) by mouth 2 (two) times daily.   guaiFENesin 600 MG 12 hr tablet Commonly known as:  MUCINEX   ipratropium-albuterol 0.5-2.5 (3) MG/3ML Soln Commonly known as:  DUONEB   levothyroxine 25 MCG tablet Commonly known as:  SYNTHROID, LEVOTHROID Take 1 tablet (25 mcg total) by mouth daily before breakfast.   lidocaine 5 % Commonly known as:  LIDODERM   methocarbamol 500 MG tablet Commonly known as:  ROBAXIN Take 1 tablet (500 mg total) by mouth every 6 (six) hours as needed for muscle spasms.   montelukast 10 MG tablet Commonly known as:  SINGULAIR   naproxen 500 MG tablet Commonly known as:  NAPROSYN   omeprazole 20 MG capsule Commonly known as:  PRILOSEC   oxyCODONE-acetaminophen 10-325 MG tablet Commonly known as:  PERCOCET Take 1 tablet by mouth every 6 (six) hours as needed for pain.   polyethylene glycol packet Commonly known as:  MIRALAX / GLYCOLAX Take 17 g by mouth daily as needed for mild constipation.   pregabalin 75 MG capsule Commonly known as:  LYRICA Take 1 capsule (75 mg total) by mouth 2 (two) times daily.   roflumilast 500 MCG Tabs tablet Commonly known as:  DALIRESP Take 1  tablet (500 mcg total)  by mouth daily.   tiotropium 18 MCG inhalation capsule Commonly known as:  SPIRIVA Place 1 capsule (18 mcg total) into inhaler and inhale daily.   valACYclovir 500 MG tablet Commonly known as:  VALTREX   varenicline 1 MG tablet Commonly known as:  CHANTIX CONTINUING MONTH PAK Take 1 tablet (1 mg total) by mouth 2 (two) times daily.   venlafaxine XR 150 MG 24 hr capsule Commonly known as:  EFFEXOR-XR Take 1 capsule (150 mg total) by mouth daily with breakfast.        SIGNIFICANT DIAGNOSTIC EXAMS  PREVIOUS:   02-06-17: right hip x-ray:  No appreciable change from 1 day prior. Prosthetic components appear well seated on frontal view. No evident fracture or dislocation.  NO NEW EXAMS  LABS REVIEWED PREVIOUS:   09-27-16: free T4: 0.72 02-03-17: hgb a1c 6.0 02-07-17: wbc 16.3; hgb 11.6; hct 34.9; mcv 95.6; plt 223; glucose 171; bun 8; creat 0.75; k+ 4.0; na++ 134; ca 8.5  NO NEW LABS     Review of Systems  Constitutional: Negative for malaise/fatigue.  Respiratory: Positive for cough and shortness of breath.        Is chronic   Cardiovascular: Negative for chest pain, palpitations and leg swelling.  Gastrointestinal: Negative for abdominal pain, constipation and heartburn.  Musculoskeletal: Negative for back pain, joint pain and myalgias.  Skin: Negative.   Neurological: Negative for dizziness.  Psychiatric/Behavioral: The patient is not nervous/anxious.     Physical Exam  Constitutional: She is oriented to person, place, and time. She appears well-developed and well-nourished. No distress.  obese  Neck: Neck supple. No thyromegaly present.  Cardiovascular: Normal rate, regular rhythm, normal heart sounds and intact distal pulses.  Pulmonary/Chest: Effort normal and breath sounds normal. No respiratory distress.  Abdominal: Soft. Bowel sounds are normal. She exhibits no distension. There is no tenderness.  Musculoskeletal: Normal range of  motion. She exhibits no edema.  Lymphadenopathy:    She has no cervical adenopathy.  Neurological: She is alert and oriented to person, place, and time.  Skin: Skin is warm and dry. She is not diaphoretic.  Incision line without signs of infection   Psychiatric: She has a normal mood and affect.    ASSESSMENT/ PLAN:  Patient is being discharged with the following home health services: pt/ot/rn/cna/sw: to evaluate and treat as indicated for gair balance strength; adl training; medication management; adl care; community resources  Patient is being discharged with the following durable medical equipment:  Front wheel walker with skis to allow patient to maintain her current level of independence with her adls. Reacher with end posts; tub transfer bench; sock aid narrow (plastic)  Patient has been advised to f/u with their PCP in 1-2 weeks to bring them up to date on their rehab stay.  Social services at facility was responsible for arranging this appointment.  Pt was provided with a 30 day supply of prescriptions for medications and refills must be obtained from their PCP.  For controlled substances, a more limited supply may be provided adequate until PCP appointment only.  A 30 day supply of her prescription medications were written per med list above  Narcotic prescriptions as follows:  lyrica 75 mg #60 Percocet 10/325 mg tabs #20  Time spent with patient: 45 minutes: with discharge process; discussion of dme needed; home health expectations; and medication regimen. Did verbalize understanding.    Ok Edwards NP [facility]  Contact [phone] Monday through Friday 8am- 5pm  After hours  call [phone]

## 2017-02-22 DIAGNOSIS — D509 Iron deficiency anemia, unspecified: Secondary | ICD-10-CM | POA: Diagnosis not present

## 2017-02-22 DIAGNOSIS — I10 Essential (primary) hypertension: Secondary | ICD-10-CM | POA: Diagnosis not present

## 2017-02-22 DIAGNOSIS — J439 Emphysema, unspecified: Secondary | ICD-10-CM | POA: Diagnosis not present

## 2017-02-22 DIAGNOSIS — J9611 Chronic respiratory failure with hypoxia: Secondary | ICD-10-CM | POA: Diagnosis not present

## 2017-02-22 DIAGNOSIS — Z96641 Presence of right artificial hip joint: Secondary | ICD-10-CM | POA: Diagnosis not present

## 2017-02-22 DIAGNOSIS — R69 Illness, unspecified: Secondary | ICD-10-CM | POA: Diagnosis not present

## 2017-02-22 DIAGNOSIS — E119 Type 2 diabetes mellitus without complications: Secondary | ICD-10-CM | POA: Diagnosis not present

## 2017-02-22 DIAGNOSIS — M159 Polyosteoarthritis, unspecified: Secondary | ICD-10-CM | POA: Diagnosis not present

## 2017-02-22 DIAGNOSIS — Z48815 Encounter for surgical aftercare following surgery on the digestive system: Secondary | ICD-10-CM | POA: Diagnosis not present

## 2017-02-22 DIAGNOSIS — K219 Gastro-esophageal reflux disease without esophagitis: Secondary | ICD-10-CM | POA: Diagnosis not present

## 2017-02-22 DIAGNOSIS — Z471 Aftercare following joint replacement surgery: Secondary | ICD-10-CM | POA: Diagnosis not present

## 2017-02-24 DIAGNOSIS — M6281 Muscle weakness (generalized): Secondary | ICD-10-CM | POA: Diagnosis not present

## 2017-02-24 DIAGNOSIS — R269 Unspecified abnormalities of gait and mobility: Secondary | ICD-10-CM | POA: Diagnosis not present

## 2017-03-03 ENCOUNTER — Telehealth: Payer: Self-pay | Admitting: Internal Medicine

## 2017-03-03 ENCOUNTER — Encounter: Payer: Self-pay | Admitting: Internal Medicine

## 2017-03-03 ENCOUNTER — Ambulatory Visit (INDEPENDENT_AMBULATORY_CARE_PROVIDER_SITE_OTHER): Payer: Medicare HMO | Admitting: Internal Medicine

## 2017-03-03 DIAGNOSIS — Z96641 Presence of right artificial hip joint: Secondary | ICD-10-CM | POA: Diagnosis not present

## 2017-03-03 DIAGNOSIS — J9611 Chronic respiratory failure with hypoxia: Secondary | ICD-10-CM

## 2017-03-03 DIAGNOSIS — I10 Essential (primary) hypertension: Secondary | ICD-10-CM | POA: Diagnosis not present

## 2017-03-03 MED ORDER — NAPROXEN 500 MG PO TABS
500.0000 mg | ORAL_TABLET | Freq: Three times a day (TID) | ORAL | 0 refills | Status: DC
Start: 2017-03-03 — End: 2017-04-01

## 2017-03-03 NOTE — Progress Notes (Signed)
   Subjective:    Patient ID: Joan [name], female    DOB: 14-Jun-1960, 57 y.o.   MRN: [id number]  HPI The patient is a 57 YO female coming in for hospital and rehab follow up (in for right hip surgery, discharged to rehab for PT/OT and then to home). Overall uncomplicated course. Is using pain medication naproxen but no percocet. She is still doing PT/OT at home. Using a walker and she feels good about healing and progress. She denies signs of infection. Her breathing is doing well. Denies flares. She is still not smoking. Using inhalers as she is supposed to. Denies chest pains, urinary symptoms, abdominal pain. Does have some diarrhea intermittent with constipation which is unchanged from usual.  PMH, [facility], social history reviewed and updated.   Review of Systems  Constitutional: Positive for activity change and fatigue. Negative for appetite change, chills, fever and unexpected weight change.  HENT: Negative.   Eyes: Negative.   Respiratory: Negative.   Cardiovascular: Negative.   Gastrointestinal: Positive for constipation and diarrhea. Negative for abdominal distention, abdominal pain and blood in stool.  Musculoskeletal: Positive for arthralgias, gait problem and myalgias. Negative for back pain and joint swelling.  Skin: Negative.   Neurological: Positive for weakness. Negative for dizziness, tremors, syncope and facial asymmetry.  Psychiatric/Behavioral: Negative.       Objective:   Physical Exam  Constitutional: She is oriented to person, place, and time. She appears well-developed and well-nourished.  HENT:  Head: Normocephalic and atraumatic.  Eyes: EOM are normal.  Neck: Normal range of motion.  Cardiovascular: Normal rate and regular rhythm.  Pulmonary/Chest: Effort normal and breath sounds normal. No respiratory distress. She has no wheezes. She has no rales.  Abdominal: Soft. Bowel sounds are normal. She exhibits no distension. There is no tenderness. There is no rebound.    Musculoskeletal: She exhibits no edema.  Neurological: She is alert and oriented to person, place, and time. Coordination abnormal.  Walker for ambulation  Skin: Skin is warm and dry.  Psychiatric: She has a normal mood and affect.   Vitals:   03/03/17 1404  BP: 110/76  Pulse: 70  Temp: 98.1 F (36.7 C)  TempSrc: Oral  SpO2: 99%  Weight: 216 lb (98 kg)  Height: 5\' 7"  (1.702 m)      Assessment & Plan:

## 2017-03-03 NOTE — Patient Instructions (Signed)
You are doing great so keep up the good work with the therapy.

## 2017-03-03 NOTE — Telephone Encounter (Signed)
Pt dropped off [employer] assistance forms sos when her power goes out she can get her power back on quickly  She would like to come and pick yp the paperwork once it is completed  Please advise  The paperwork is in her brothers name Joan [name]

## 2017-03-05 NOTE — Assessment & Plan Note (Signed)
BP stable on atenolol and will continue.

## 2017-03-05 NOTE — Assessment & Plan Note (Signed)
She did well with the surgery and is still a non-smoker. She did not need prolonged intubation after surgery. Is using her duonebs and albuterol as needed and symbicort, spiriva, daliresp daily

## 2017-03-05 NOTE — Assessment & Plan Note (Signed)
No signs of infection or severe pain. Working with PT/OT and will follow up with orthopedics as well. She is pleased with the surgery.

## 2017-03-06 NOTE — Telephone Encounter (Signed)
Patient informed forms were filled out and signed and placed upfront for pick up

## 2017-03-07 DIAGNOSIS — Z96641 Presence of right artificial hip joint: Secondary | ICD-10-CM | POA: Diagnosis not present

## 2017-03-07 DIAGNOSIS — Z471 Aftercare following joint replacement surgery: Secondary | ICD-10-CM | POA: Diagnosis not present

## 2017-03-08 ENCOUNTER — Other Ambulatory Visit: Payer: Self-pay | Admitting: Internal Medicine

## 2017-03-10 DIAGNOSIS — J9611 Chronic respiratory failure with hypoxia: Secondary | ICD-10-CM | POA: Diagnosis not present

## 2017-03-10 DIAGNOSIS — J449 Chronic obstructive pulmonary disease, unspecified: Secondary | ICD-10-CM | POA: Diagnosis not present

## 2017-03-10 DIAGNOSIS — J441 Chronic obstructive pulmonary disease with (acute) exacerbation: Secondary | ICD-10-CM | POA: Diagnosis not present

## 2017-03-11 ENCOUNTER — Telehealth: Payer: Self-pay | Admitting: Pulmonary Disease

## 2017-03-11 NOTE — Telephone Encounter (Signed)
ATC pt, no answer. Left message for pt to call back.  I called the pharmacy and they stated the insurance would cover these inhalers.  Advair, Breo, Incruse, and Anoro  RA did you want to change to one of these inhalers? According to her med list she has not tried any of these medications. She is also on Symbicort. Please advise.   Assessment & Plan Note by [name], MD at 01/14/2017 1:35 PM   Author: [name], MD Author Type: Physician Filed: 01/14/2017 1:36 PM  Note Status: Written Cosign: Cosign Not Required Encounter Date: 01/13/2017  Problem: COPD, severe ([place])  Editor: [name], MD (Physician)    Ct Symbicort and Spiriva  Limit albuterol use due to tachyarrhythmias

## 2017-03-11 NOTE — Telephone Encounter (Signed)
Started on Incruse  1 puff twice daily

## 2017-03-11 NOTE — Telephone Encounter (Signed)
lmtcb for pt. Will await call back to make pt aware prior to ordering new inhaler.

## 2017-03-12 MED ORDER — UMECLIDINIUM BROMIDE 62.5 MCG/INH IN AEPB: 1.0000 | INHALATION_SPRAY | Freq: Every day | RESPIRATORY_TRACT | 5 refills | Status: DC

## 2017-03-12 NOTE — Telephone Encounter (Signed)
Spoke with the pt and notified of recs per RA  Rx for Incruse was sent

## 2017-03-12 NOTE — Telephone Encounter (Signed)
ATC pt, no answer. Left message for pt to call back.  

## 2017-03-12 NOTE — Telephone Encounter (Signed)
Pt is calling back [phone] 

## 2017-03-20 ENCOUNTER — Encounter (HOSPITAL_BASED_OUTPATIENT_CLINIC_OR_DEPARTMENT_OTHER): Payer: Medicare HMO

## 2017-03-22 DIAGNOSIS — E119 Type 2 diabetes mellitus without complications: Secondary | ICD-10-CM | POA: Diagnosis not present

## 2017-03-23 ENCOUNTER — Ambulatory Visit (HOSPITAL_BASED_OUTPATIENT_CLINIC_OR_DEPARTMENT_OTHER): Payer: Medicare HMO | Attending: Pulmonary Disease | Admitting: Pulmonary Disease

## 2017-03-23 VITALS — Ht 67.0 in | Wt 220.0 lb

## 2017-03-23 DIAGNOSIS — R0683 Snoring: Secondary | ICD-10-CM | POA: Insufficient documentation

## 2017-03-23 DIAGNOSIS — G4730 Sleep apnea, unspecified: Secondary | ICD-10-CM

## 2017-03-24 ENCOUNTER — Encounter: Payer: Self-pay | Admitting: Internal Medicine

## 2017-03-24 ENCOUNTER — Ambulatory Visit (INDEPENDENT_AMBULATORY_CARE_PROVIDER_SITE_OTHER): Payer: Medicare HMO | Admitting: Internal Medicine

## 2017-03-24 DIAGNOSIS — I10 Essential (primary) hypertension: Secondary | ICD-10-CM

## 2017-03-24 MED ORDER — ALBUTEROL SULFATE HFA 108 (90 BASE) MCG/ACT IN AERS: 2.0000 | INHALATION_SPRAY | Freq: Four times a day (QID) | RESPIRATORY_TRACT | 1 refills | Status: DC | PRN

## 2017-03-24 MED ORDER — MONTELUKAST SODIUM 10 MG PO TABS: 10.0000 mg | ORAL_TABLET | Freq: Every day | ORAL | 3 refills | Status: DC

## 2017-03-24 NOTE — Patient Instructions (Signed)
We have sent in all the refills today to the mail order pharmacy.

## 2017-03-24 NOTE — Progress Notes (Signed)
   Subjective:    Patient ID: Joan [name], female    DOB: [birth year], 57 y.o.   MRN: [id number]  HPI The patient is a 57 YO female coming in for follow up. She is concerned about some medications that she is taking. She denies being in the hospital again. She is having some extra bottles and wants to reaffirm her list. She denies fevers or chills. SOB is stable. Pain is stable and she is taking nsaids only. Denies abdominal pain or nausea or vomiting or constipation or diarrhea. She has all the written prescriptions from the rehab facility. Never filled her narcotic.   Review of Systems  Constitutional: Negative.   HENT: Negative.   Eyes: Negative.   Respiratory: Positive for shortness of breath. Negative for cough and chest tightness.        Stable  Cardiovascular: Negative for chest pain, palpitations and leg swelling.  Gastrointestinal: Negative for abdominal distention, abdominal pain, constipation, diarrhea, nausea and vomiting.  Musculoskeletal: Positive for arthralgias and gait problem. Negative for joint swelling.  Skin: Negative.   Psychiatric/Behavioral: Negative.       Objective:   Physical Exam  Constitutional: She is oriented to person, place, and time. She appears well-developed and well-nourished.  HENT:  Head: Normocephalic and atraumatic.  Eyes: EOM are normal.  Neck: Normal range of motion.  Cardiovascular: Normal rate and regular rhythm.  Pulmonary/Chest: Effort normal and breath sounds normal. No respiratory distress. She has no wheezes. She has no rales.  Lung exam stable  Abdominal: Soft. Bowel sounds are normal. She exhibits no distension. There is no tenderness. There is no rebound.  Musculoskeletal: She exhibits no edema.  Neurological: She is alert and oriented to person, place, and time. Coordination normal.  Cane for ambulation  Skin: Skin is warm and dry.  Psychiatric:  Some confusion with medications   Vitals:   03/24/17 1300  BP: 140/90  Pulse: 61   Temp: 98 F (36.7 C)  TempSrc: Oral  SpO2: 99%  Weight: 218 lb (98.9 kg)  Height: 5\' 7"  (1.702 m)      Assessment & Plan:

## 2017-03-25 ENCOUNTER — Telehealth: Payer: Self-pay | Admitting: Pulmonary Disease

## 2017-03-25 DIAGNOSIS — G4733 Obstructive sleep apnea (adult) (pediatric): Secondary | ICD-10-CM

## 2017-03-25 NOTE — Progress Notes (Signed)
Patient Name: Aleigh, Grunden Date: 03/23/2017 Gender: Female D.O.B: 07/16/1960 Age (years): 3 Referring Provider: Kara Mead MD, ABSM Height (inches): 67 Interpreting Physician: Kara Mead MD, ABSM Weight (lbs): 220 RPSGT: Baxter Flattery BMI: 34 MRN: [id number] Neck Size: 15.50 <br> <br>   CLINICAL INFORMATION Sleep Study Type: NPSG  Indication for sleep study: Congestive Heart Failure, COPD, Fatigue, Obesity, OSA, Snoring, Witnessed Apneas  Epworth Sleepiness Score: 13    SLEEP STUDY TECHNIQUE As per the AASM Manual for the Scoring of Sleep and Associated Events v2.3 (April 2016) with a hypopnea requiring 4% desaturations.  The channels recorded and monitored were frontal, central and occipital EEG, electrooculogram (EOG), submentalis EMG (chin), nasal and oral airflow, thoracic and abdominal wall motion, anterior tibialis EMG, snore microphone, electrocardiogram, and pulse oximetry.  MEDICATIONS Medications self-administered by patient taken the night of the study : BACLOFEN, NAPROXEN, PREGABALIN, ROFLUMILAST, BENADRYL, VALTREX  SLEEP ARCHITECTURE The study was initiated at 10:19:20 PM and ended at 4:57:32 AM.  Sleep onset time was 23.6 minutes and the sleep efficiency was 59.4%. The total sleep time was 236.5 minutes.  Stage REM latency was 327.0 minutes.  The patient spent 5.71% of the night in stage N1 sleep, 78.86% in stage N2 sleep, 0.00% in stage N3 and 15.43% in REM.  Alpha intrusion was absent.  Supine sleep was 36.36%.  RESPIRATORY PARAMETERS The overall apnea/hypopnea index (AHI) was 3.0 per hour. There were 11 total apneas, including 10 obstructive, 1 central and 0 mixed apneas. There were 1 hypopneas and 0 RERAs.  The AHI during Stage REM sleep was 1.6 per hour.  AHI while supine was 2.8 per hour.  The mean oxygen saturation was 92.93%. The minimum SpO2 during sleep was 88.00%.  loud snoring was noted during this study.  CARDIAC DATA The 2  lead EKG demonstrated sinus rhythm. The mean heart rate was 62.11 beats per minute. Other EKG findings include: None. LEG MOVEMENT DATA The total PLMS were 0 with a resulting PLMS index of 0.00. Associated arousal with leg movement index was 0.0 .  IMPRESSIONS - No significant obstructive sleep apnea occurred during this study (AHI = 3.0/h). - No significant central sleep apnea occurred during this study (CAI = 0.3/h). - Moderate oxygen desaturation was noted during this study (Min O2 = 88.00%). - The patient snored with loud snoring volume. - No cardiac abnormalities were noted during this study. - Clinically significant periodic limb movements did not occur during sleep. No significant associated arousals.   DIAGNOSIS - No evidence of sleep disordered breathing   RECOMMENDATIONS - Avoid alcohol, sedatives and other CNS depressants that may worsen sleep apnea and disrupt normal sleep architecture. - Sleep hygiene should be reviewed to assess factors that may improve sleep quality. - Weight management and regular exercise should be initiated or continued if appropriate.

## 2017-03-25 NOTE — Telephone Encounter (Signed)
NPSG did not show OSA She can stop using PAP machine

## 2017-03-25 NOTE — Assessment & Plan Note (Signed)
Taking atenolol for BP, we have confiscated the metoprolol bottle she has as she is not taking it anymore.

## 2017-03-26 NOTE — Telephone Encounter (Signed)
Spoke with patient. She stated that she was upset with the way that the test was performed. She was under the impression that half of the study would be done on the cpap machine and the other half to be performed on room air.   She is concerned that her results are not accurate.   RA, please advise. Thanks!

## 2017-03-27 ENCOUNTER — Other Ambulatory Visit: Payer: Self-pay | Admitting: General Surgery

## 2017-03-27 DIAGNOSIS — Z720 Tobacco use: Secondary | ICD-10-CM | POA: Diagnosis not present

## 2017-03-27 DIAGNOSIS — J449 Chronic obstructive pulmonary disease, unspecified: Secondary | ICD-10-CM | POA: Diagnosis not present

## 2017-03-27 DIAGNOSIS — R7303 Prediabetes: Secondary | ICD-10-CM | POA: Diagnosis not present

## 2017-03-27 DIAGNOSIS — K219 Gastro-esophageal reflux disease without esophagitis: Secondary | ICD-10-CM | POA: Diagnosis not present

## 2017-03-27 DIAGNOSIS — K432 Incisional hernia without obstruction or gangrene: Secondary | ICD-10-CM | POA: Diagnosis not present

## 2017-03-27 DIAGNOSIS — Z6832 Body mass index (BMI) 32.0-32.9, adult: Secondary | ICD-10-CM | POA: Diagnosis not present

## 2017-03-27 DIAGNOSIS — E039 Hypothyroidism, unspecified: Secondary | ICD-10-CM | POA: Diagnosis not present

## 2017-03-27 NOTE — Telephone Encounter (Signed)
She only slept for a few hours.  test was performed in a split manner-meaning that CPAP was to be used only if she met criteria and she did not in those few hours. We can certainly repeat the test as a home study

## 2017-03-30 NOTE — H&P (Addendum)
Joan [name]  Location: [facility] Surgery Patient #: [id number] DOB: 1960/07/26 Widowed / Language: Cleophus Molt / Race: White Female        History of Present Illness  The patient is a 57 year old female who presents with an incisional hernia. This is a 57 year old female who returns following recovery from hip replacement surgery. She is now ready to schedule her surgery for her recurrent incisional hernia. Dr. Gladstone Lighter is her orthopedist. Dr. Chase Caller is her pulmonary medicine doctor. Dr. Sharlet Salina at [facility].[name] is her PCP.  She has probably had 3 periumbilical operations. Umbilical hernia repair with mesh. Mesh disruption and infection and subsequent mesh explantation with primary root repair. All in [place]. 2014 in later. She has a painful recurrence but has never been obstructed or incarcerated. It is still painful and she will really was to have this repaired. She's had a CT scan which shows a 4 cm defect with transverse colon present in the defect.  Comorbidities include significant COPD. Quit smoking in September 2018 on Chantix. Followed by pulmonary medicine. They have cleared her for incisional hernia repair with moderate risk. Diabetes. BMI 34. GERD. Social history reveals she moved here from [place] and now lives with her brother. Widowed for 6 years. 3 children. No tobacco for 4 months.  We going to schedule her for laparoscopic repair of her recurrent incisional hernia with mesh. This may have to be converted to open. It all depends on how dense or significantly adhesions are under the umbilicus. I discussed the indications, details, techniques, and numerous risk of surgery with her. She is aware of risk of bleeding, infection, recurrence being greater than 10% due to her pulmonary disease, injury to adjacent organs requiring major reconstructive surgery, wound healing problems, cardiac pulmonary and thromboembolic problems. She understands all  of these issues. All her questions are answered. She agrees with this plan.   Allergies  Dilaudid *ANALGESICS - OPIOID*  Keflex *CEPHALOSPORINS*  PrednisoLONE *CORTICOSTEROIDS*  Tetracycline *CHEMICALS*  Shellfish  Allergies Reconciled   Medication History  Albuterol Sulfate ((2.5 MG/3ML)0.083% Nebulized Soln, Inhalation) Active. Acetaminophen (325MG  Tablet, Oral) Active. Atorvastatin Calcium (20MG  Tablet, Oral) Active. Baclofen (10MG  Tablet, Oral) Active. ClonazePAM (0.5MG  Tablet, Oral) Active. Daliresp (500MCG Tablet, Oral) Active. Venlafaxine HCl ER (150MG  Capsule ER 24HR, Oral) Active. HydroCHLOROthiazide (25MG  Tablet, Oral) Active. Lyrica (75MG  Capsule, Oral) Active. Metoprolol Tartrate (25MG  Tablet, Oral) Active. Montelukast Sodium (10MG  Tablet, Oral) Active. Naproxen Sodium (500MG  Tablet ER, Oral) Active. Chantix Starting Month Pak (0.5 MG X 11 &1 MG X 42 Tablet, Oral) Active. Pantoprazole Sodium (40MG  Tablet DR, Oral) Active. Spiriva HandiHaler (18MCG Capsule, Inhalation) Active. Symbicort (160-4.5MCG/ACT Aerosol, Inhalation) Active. ValACYclovir HCl (500MG  Tablet, Oral) Active. Xopenex (Inhalation) Specific strength unknown - Active. Atenolol (25MG  Tablet, Oral) Active. Chantix Continuing Month Pak (1MG  Tablet, Oral) Active. Incruse Ellipta (62.5MCG/INH Aero Pow Br Act, Inhalation) Active. Levothyroxine Sodium (25MCG Tablet, Oral) Active. Naproxen (500MG  Tablet, Oral) Active. Medications Reconciled  Vitals  Weight: 218.6 lb Height: 67in Body Surface Area: 2.1 m Body Mass Index: 34.24 kg/m  Temp.: 98.26F  Pulse: 52 (Regular)  BP: 150/80 (Sitting, Left Arm, Standard)        General Mental Status-Alert. General Appearance-Not in acute distress. Build & Nutrition-Well nourished. Posture-Normal posture. Gait-Normal.  Head and Neck Head-normocephalic, atraumatic with no lesions or palpable  masses. Trachea-midline. Thyroid Gland Characteristics - normal size and consistency and no palpable nodules.  Chest and Lung Exam Chest and lung exam reveals -on auscultation, normal breath sounds, no  adventitious sounds and normal vocal resonance. Note: Breath sounds are little bit distant. No wheezes or rhonchi.   Cardiovascular Cardiovascular examination reveals -normal heart sounds, regular rate and rhythm with no murmurs and femoral artery auscultation bilaterally reveals normal pulses, no bruits, no thrills.  Abdomen Note: Central obesity. Soft. A little bit of tenderness around the hernia. Seems mostly reducible. The skin is intact and not inflamed. 2 scars at the umbilicus. Bulges 4 cm in size. The defect may be smaller. Liver and spleen not enlarged. No other scars on the abdomen.   Neurologic Neurologic evaluation reveals -alert and oriented x 3 with no impairment of recent or remote memory, normal attention span and ability to concentrate, normal sensation and normal coordination.  Musculoskeletal Normal Exam - Bilateral-Upper Extremity Strength Normal and Lower Extremity Strength Normal.    Assessment & Plan  RECURRENT INCISIONAL HERNIA (K43.2)    you seem to be recovering from your hip replacement without any apparent complications. you stopped smoking in September 2018 and I congratulated you for that  you state that your recurrent incisional hernia is still painful and he would like to have surgery I think that is reasonable, although you are at increased risk because of her pulmonary problems and multiple hernia operations The CT scan shows a 4 cm hernia defect and the transverse colon is within the hernia but not obstructed  you will be scheduled for laparoscopic repair of your recurrent incisional hernia with mesh This may or may not need to be converted to an open repair This may be relatively simple or the intestinal adhesions may be very  difficult increasing the risk of surgery We discussed all of these issues today. We discussed the indications, techniques, and risk of this surgery in detail  Walk as much as possible on a daily basis to keep yourself fit Try to lose weight if possible.  CONTINUOUS TOBACCO ABUSE (Z72.0) Impression: Quit smoking in September 2018 COPD, MODERATE (J44.9) CHRONIC GERD (K21.9) HYPOTHYROIDISM, ADULT (E03.9) BORDERLINE DIABETES (R73.03) BMI 32.0-32.9,ADULT (P50.93)   Edsel Petrin. Dalbert Batman, M.D., [facility] General and Minimally invasive Surgery Breast and Colorectal Surgery Office:   [phone] Pager:   [phone]

## 2017-03-31 DIAGNOSIS — Z96641 Presence of right artificial hip joint: Secondary | ICD-10-CM | POA: Diagnosis not present

## 2017-03-31 DIAGNOSIS — Z471 Aftercare following joint replacement surgery: Secondary | ICD-10-CM | POA: Diagnosis not present

## 2017-03-31 DIAGNOSIS — M25552 Pain in left hip: Secondary | ICD-10-CM | POA: Diagnosis not present

## 2017-04-01 ENCOUNTER — Other Ambulatory Visit: Payer: Self-pay | Admitting: Adult Health

## 2017-04-01 ENCOUNTER — Other Ambulatory Visit: Payer: Self-pay | Admitting: Internal Medicine

## 2017-04-01 NOTE — Telephone Encounter (Signed)
Is it okay to send 90 day supply?

## 2017-04-03 ENCOUNTER — Other Ambulatory Visit: Payer: Self-pay

## 2017-04-03 ENCOUNTER — Encounter (HOSPITAL_COMMUNITY): Payer: Self-pay | Admitting: *Deleted

## 2017-04-03 MED ORDER — NAPROXEN 500 MG PO TABS: 500.0000 mg | ORAL_TABLET | Freq: Two times a day (BID) | ORAL | 1 refills | Status: DC

## 2017-04-03 NOTE — Anesthesia Preprocedure Evaluation (Addendum)
Anesthesia Evaluation  Patient identified by MRN, date of birth, ID band Patient awake    Reviewed: Allergy & Precautions, NPO status , Patient's Chart, lab work & pertinent test results  Airway Mallampati: II   Neck ROM: Full  Mouth opening: Limited Mouth Opening  Dental  (+) Partial Upper, Partial Lower   Pulmonary neg pulmonary ROS, COPD, former smoker,     + decreased breath sounds      Cardiovascular hypertension, negative cardio ROS   Rhythm:Regular Rate:Normal  LV EF: 55% -   60%   Neuro/Psych negative neurological ROS  negative psych ROS   GI/Hepatic negative GI ROS, Neg liver ROS,   Endo/Other  negative endocrine ROSdiabetes  Renal/GU negative Renal ROS  negative genitourinary   Musculoskeletal negative musculoskeletal ROS (+)   Abdominal   Peds  Hematology negative hematology ROS (+)   Anesthesia Other Findings   Reproductive/Obstetrics                           Anesthesia Physical Anesthesia Plan  ASA: IV  Anesthesia Plan: General   Post-op Pain Management:    Induction:   PONV Risk Score and Plan: Treatment may vary due to age or medical condition, Ondansetron and Dexamethasone  Airway Management Planned: Oral ETT  Additional Equipment:   Intra-op Plan:   Post-operative Plan: Extubation in OR  Informed Consent: I have reviewed the patients History and Physical, chart, labs and discussed the procedure including the risks, benefits and alternatives for the proposed anesthesia with the patient or authorized representative who has indicated his/her understanding and acceptance.   Dental advisory given  Plan Discussed with: CRNA and Anesthesiologist  Anesthesia Plan Comments:        Anesthesia Quick Evaluation

## 2017-04-03 NOTE — Progress Notes (Signed)
Pt denies SOB, chest pain, and being under the care of a cardiologist. Pt stated that a stress test and cardiac cath was performed by Dr. Jaquita Rector, Cardiology in [facility]; records requested. Pt stated that she stopped taking Aspirin, vitamins, fish oil and herbal medications. Do not take any NSAIDs ie: Ibuprofen, Advil, Naproxen (Naprosyn/Aleve), Motrin, BC and Goody Powder. Pt stated that she does not take medication for diabetes nor does she check her blood glucose. Pt verbalized understanding of all pre-op instructions.

## 2017-04-04 ENCOUNTER — Encounter (HOSPITAL_COMMUNITY)
Admission: AD | Disposition: A | Discharge status: Home Health | Payer: Self-pay | Source: Ambulatory Visit | Attending: General Surgery

## 2017-04-04 ENCOUNTER — Other Ambulatory Visit: Payer: Self-pay

## 2017-04-04 ENCOUNTER — Encounter (HOSPITAL_COMMUNITY): Payer: Self-pay | Admitting: Certified Registered Nurse Anesthetist

## 2017-04-04 ENCOUNTER — Inpatient Hospital Stay (HOSPITAL_COMMUNITY)
Admission: AD | Admit: 2017-04-04 | Discharge: 2017-04-08 | DRG: 355 | Disposition: A | Discharge status: Home Health | Payer: Medicare HMO | Source: Ambulatory Visit | Attending: General Surgery | Admitting: General Surgery

## 2017-04-04 ENCOUNTER — Ambulatory Visit (HOSPITAL_COMMUNITY): Payer: Medicare HMO | Admitting: Anesthesiology

## 2017-04-04 DIAGNOSIS — J9611 Chronic respiratory failure with hypoxia: Secondary | ICD-10-CM | POA: Diagnosis not present

## 2017-04-04 DIAGNOSIS — I10 Essential (primary) hypertension: Secondary | ICD-10-CM | POA: Diagnosis not present

## 2017-04-04 DIAGNOSIS — E039 Hypothyroidism, unspecified: Secondary | ICD-10-CM | POA: Diagnosis not present

## 2017-04-04 DIAGNOSIS — K430 Incisional hernia with obstruction, without gangrene: Secondary | ICD-10-CM | POA: Diagnosis not present

## 2017-04-04 DIAGNOSIS — G8918 Other acute postprocedural pain: Secondary | ICD-10-CM | POA: Diagnosis not present

## 2017-04-04 DIAGNOSIS — R7303 Prediabetes: Secondary | ICD-10-CM | POA: Diagnosis present

## 2017-04-04 DIAGNOSIS — Z7951 Long term (current) use of inhaled steroids: Secondary | ICD-10-CM | POA: Diagnosis not present

## 2017-04-04 DIAGNOSIS — K5900 Constipation, unspecified: Secondary | ICD-10-CM | POA: Diagnosis present

## 2017-04-04 DIAGNOSIS — K436 Other and unspecified ventral hernia with obstruction, without gangrene: Secondary | ICD-10-CM

## 2017-04-04 DIAGNOSIS — Z96649 Presence of unspecified artificial hip joint: Secondary | ICD-10-CM | POA: Diagnosis not present

## 2017-04-04 DIAGNOSIS — J449 Chronic obstructive pulmonary disease, unspecified: Secondary | ICD-10-CM | POA: Diagnosis not present

## 2017-04-04 DIAGNOSIS — K219 Gastro-esophageal reflux disease without esophagitis: Secondary | ICD-10-CM | POA: Diagnosis not present

## 2017-04-04 DIAGNOSIS — Z87891 Personal history of nicotine dependence: Secondary | ICD-10-CM | POA: Diagnosis not present

## 2017-04-04 HISTORY — DX: Other and unspecified ventral hernia with obstruction, without gangrene: K43.6

## 2017-04-04 HISTORY — DX: Gastro-esophageal reflux disease without esophagitis: K21.9

## 2017-04-04 HISTORY — DX: Dependence on supplemental oxygen: Z99.81

## 2017-04-04 HISTORY — DX: Headache, unspecified: R51.9

## 2017-04-04 HISTORY — DX: Nonspecific reaction to tuberculin skin test without active tuberculosis: R76.11

## 2017-04-04 HISTORY — PX: INCISIONAL HERNIA REPAIR: SHX193

## 2017-04-04 HISTORY — PX: LAPAROSCOPIC INCISIONAL / UMBILICAL / VENTRAL HERNIA REPAIR: SUR789

## 2017-04-04 HISTORY — DX: Headache: R51

## 2017-04-04 LAB — CBC WITH DIFFERENTIAL/PLATELET
Basophils Absolute: 0.1 10*3/uL (ref 0.0–0.1)
Basophils Relative: 1 %
Eosinophils Absolute: 0.5 10*3/uL (ref 0.0–0.7)
Eosinophils Relative: 5 %
HCT: 38.3 % (ref 36.0–46.0)
Hemoglobin: 12.4 g/dL (ref 12.0–15.0)
Lymphocytes Relative: 31 %
Lymphs Abs: 3.2 10*3/uL (ref 0.7–4.0)
MCH: 31.2 pg (ref 26.0–34.0)
MCHC: 32.4 g/dL (ref 30.0–36.0)
MCV: 96.5 fL (ref 78.0–100.0)
Monocytes Absolute: 0.8 10*3/uL (ref 0.1–1.0)
Monocytes Relative: 8 %
Neutro Abs: 5.7 10*3/uL (ref 1.7–7.7)
Neutrophils Relative %: 55 %
Platelets: 254 10*3/uL (ref 150–400)
RBC: 3.97 MIL/uL (ref 3.87–5.11)
RDW: 13 % (ref 11.5–15.5)
WBC: 10.3 10*3/uL (ref 4.0–10.5)

## 2017-04-04 LAB — COMPREHENSIVE METABOLIC PANEL
ALT: 18 U/L (ref 14–54)
AST: 29 U/L (ref 15–41)
Albumin: 3.8 g/dL (ref 3.5–5.0)
Alkaline Phosphatase: 118 U/L (ref 38–126)
Anion gap: 15 (ref 5–15)
BUN: 15 mg/dL (ref 6–20)
CO2: 19 mmol/L — ABNORMAL LOW (ref 22–32)
Calcium: 8.9 mg/dL (ref 8.9–10.3)
Chloride: 105 mmol/L (ref 101–111)
Creatinine, Ser: 1.13 mg/dL — ABNORMAL HIGH (ref 0.44–1.00)
GFR calc Af Amer: >60 mL/min (ref >60)
GFR calc non Af Amer: 54 mL/min — ABNORMAL LOW (ref >60)
Glucose, Bld: 126 mg/dL — ABNORMAL HIGH (ref 65–99)
Potassium: 3.8 mmol/L (ref 3.5–5.1)
Sodium: 139 mmol/L (ref 135–145)
Total Bilirubin: 0.7 mg/dL (ref 0.3–1.2)
Total Protein: 6.1 g/dL — ABNORMAL LOW (ref 6.5–8.1)

## 2017-04-04 LAB — GLUCOSE, CAPILLARY
Glucose-Capillary: 140 mg/dL — ABNORMAL HIGH (ref 65–99)
Glucose-Capillary: 161 mg/dL — ABNORMAL HIGH (ref 65–99)
Glucose-Capillary: 94 mg/dL (ref 65–99)
Glucose-Capillary: 141 mg/dL — ABNORMAL HIGH (ref 65–99)

## 2017-04-04 LAB — SURGICAL PCR SCREEN
MRSA, PCR: POSITIVE — AB
Staphylococcus aureus: POSITIVE — AB

## 2017-04-04 SURGERY — REPAIR, HERNIA, INCISIONAL, LAPAROSCOPIC
Anesthesia: General | Site: Abdomen

## 2017-04-04 MED ORDER — HYDROMORPHONE HCL 1 MG/ML IJ SOLN
INTRAMUSCULAR | Status: AC
  Administered 2017-04-04: 0.5 mg via INTRAVENOUS
  Filled 2017-04-04: qty 1

## 2017-04-04 MED ORDER — PREGABALIN 75 MG PO CAPS
75.0000 mg | ORAL_CAPSULE | Freq: Two times a day (BID) | ORAL | Status: DC
Start: 2017-04-04 — End: 2017-04-08
  Administered 2017-04-04 – 2017-04-08 (×8): 75 mg via ORAL
  Filled 2017-04-04 (×8): qty 1

## 2017-04-04 MED ORDER — MIDAZOLAM HCL 5 MG/5ML IJ SOLN
INTRAMUSCULAR | Status: DC | PRN
  Administered 2017-04-04: 2 mg via INTRAVENOUS

## 2017-04-04 MED ORDER — ONDANSETRON HCL 4 MG/2ML IJ SOLN
INTRAMUSCULAR | Status: AC
  Filled 2017-04-04: qty 2

## 2017-04-04 MED ORDER — ATENOLOL 25 MG PO TABS
25.0000 mg | ORAL_TABLET | Freq: Every day | ORAL | Status: DC
  Administered 2017-04-05 – 2017-04-08 (×4): 25 mg via ORAL
  Filled 2017-04-04 (×4): qty 1

## 2017-04-04 MED ORDER — ONDANSETRON HCL 4 MG/2ML IJ SOLN: 4.0000 mg | Freq: Four times a day (QID) | INTRAMUSCULAR | Status: DC | PRN

## 2017-04-04 MED ORDER — VALACYCLOVIR HCL 500 MG PO TABS
500.0000 mg | ORAL_TABLET | Freq: Every day | ORAL | Status: DC
  Administered 2017-04-05 – 2017-04-08 (×4): 500 mg via ORAL
  Filled 2017-04-04 (×4): qty 1

## 2017-04-04 MED ORDER — CHLORHEXIDINE GLUCONATE CLOTH 2 % EX PADS: 6.0000 | MEDICATED_PAD | Freq: Once | CUTANEOUS | Status: DC

## 2017-04-04 MED ORDER — VANCOMYCIN HCL 10 G IV SOLR
1250.0000 mg | Freq: Two times a day (BID) | INTRAVENOUS | Status: DC
  Administered 2017-04-04 – 2017-04-06 (×5): 1250 mg via INTRAVENOUS
  Filled 2017-04-04 (×7): qty 1250

## 2017-04-04 MED ORDER — ROCURONIUM BROMIDE 10 MG/ML (PF) SYRINGE
PREFILLED_SYRINGE | INTRAVENOUS | Status: AC
  Filled 2017-04-04: qty 5

## 2017-04-04 MED ORDER — SUGAMMADEX SODIUM 200 MG/2ML IV SOLN
INTRAVENOUS | Status: AC
  Filled 2017-04-04: qty 2

## 2017-04-04 MED ORDER — VENLAFAXINE HCL ER 150 MG PO CP24
150.0000 mg | ORAL_CAPSULE | Freq: Every day | ORAL | Status: DC
  Administered 2017-04-05 – 2017-04-08 (×4): 150 mg via ORAL
  Filled 2017-04-04: qty 1
  Filled 2017-04-04: qty 2
  Filled 2017-04-04: qty 1
  Filled 2017-04-04: qty 2
  Filled 2017-04-04: qty 1
  Filled 2017-04-04 (×2): qty 2
  Filled 2017-04-04: qty 1

## 2017-04-04 MED ORDER — ACETAMINOPHEN 10 MG/ML IV SOLN: 1000.0000 mg | Freq: Once | INTRAVENOUS | Status: DC | PRN

## 2017-04-04 MED ORDER — ROFLUMILAST 500 MCG PO TABS
500.0000 ug | ORAL_TABLET | Freq: Every day | ORAL | Status: DC
  Administered 2017-04-05 – 2017-04-08 (×4): 500 ug via ORAL
  Filled 2017-04-04 (×4): qty 1

## 2017-04-04 MED ORDER — BACLOFEN 10 MG PO TABS
10.0000 mg | ORAL_TABLET | Freq: Every day | ORAL | Status: DC
  Administered 2017-04-04 – 2017-04-07 (×4): 10 mg via ORAL
  Filled 2017-04-04 (×4): qty 1

## 2017-04-04 MED ORDER — NAPROXEN 500 MG PO TABS
500.0000 mg | ORAL_TABLET | Freq: Two times a day (BID) | ORAL | Status: DC
  Administered 2017-04-04 – 2017-04-05 (×2): 500 mg via ORAL
  Filled 2017-04-04 (×3): qty 1
  Filled 2017-04-04 (×2): qty 2

## 2017-04-04 MED ORDER — BUPIVACAINE-EPINEPHRINE 0.25% -1:200000 IJ SOLN
INTRAMUSCULAR | Status: AC
  Filled 2017-04-04: qty 1

## 2017-04-04 MED ORDER — SENNA 8.6 MG PO TABS
1.0000 | ORAL_TABLET | Freq: Two times a day (BID) | ORAL | Status: DC
  Administered 2017-04-04 – 2017-04-08 (×9): 8.6 mg via ORAL
  Filled 2017-04-04 (×9): qty 1

## 2017-04-04 MED ORDER — HYDROMORPHONE HCL 1 MG/ML IJ SOLN
0.2500 mg | INTRAMUSCULAR | Status: DC | PRN
Start: 2017-04-04 — End: 2017-04-04
  Administered 2017-04-04 (×3): 0.5 mg via INTRAVENOUS

## 2017-04-04 MED ORDER — PROPOFOL 10 MG/ML IV BOLUS
INTRAVENOUS | Status: AC
  Filled 2017-04-04: qty 20

## 2017-04-04 MED ORDER — ROCURONIUM BROMIDE 100 MG/10ML IV SOLN
INTRAVENOUS | Status: DC | PRN
  Administered 2017-04-04 (×2): 10 mg via INTRAVENOUS
  Administered 2017-04-04: 50 mg via INTRAVENOUS

## 2017-04-04 MED ORDER — ACETAMINOPHEN 500 MG PO TABS
1000.0000 mg | ORAL_TABLET | ORAL | Status: AC
  Administered 2017-04-04: 1000 mg via ORAL
  Filled 2017-04-04: qty 2

## 2017-04-04 MED ORDER — LIDOCAINE HCL (CARDIAC) 20 MG/ML IV SOLN
INTRAVENOUS | Status: DC | PRN
  Administered 2017-04-04: 100 mg via INTRAVENOUS

## 2017-04-04 MED ORDER — VARENICLINE TARTRATE 1 MG PO TABS
1.0000 mg | ORAL_TABLET | Freq: Two times a day (BID) | ORAL | Status: DC
  Administered 2017-04-04 – 2017-04-08 (×9): 1 mg via ORAL
  Filled 2017-04-04 (×9): qty 1

## 2017-04-04 MED ORDER — MOMETASONE FURO-FORMOTEROL FUM 200-5 MCG/ACT IN AERO
2.0000 | INHALATION_SPRAY | Freq: Two times a day (BID) | RESPIRATORY_TRACT | Status: DC
  Administered 2017-04-04 – 2017-04-08 (×8): 2 via RESPIRATORY_TRACT
  Filled 2017-04-04: qty 8.8

## 2017-04-04 MED ORDER — LACTATED RINGERS IV SOLN
INTRAVENOUS | Status: DC
  Administered 2017-04-04 – 2017-04-05 (×3): via INTRAVENOUS

## 2017-04-04 MED ORDER — FENTANYL CITRATE (PF) 100 MCG/2ML IJ SOLN
25.0000 ug | INTRAMUSCULAR | Status: DC | PRN
  Administered 2017-04-04 – 2017-04-06 (×13): 25 ug via INTRAVENOUS
  Filled 2017-04-04 (×13): qty 2

## 2017-04-04 MED ORDER — MEPERIDINE HCL 50 MG/ML IJ SOLN: 6.2500 mg | INTRAMUSCULAR | Status: DC | PRN

## 2017-04-04 MED ORDER — SODIUM CHLORIDE 0.9 % IR SOLN
Status: DC | PRN
  Administered 2017-04-04: 1000 mL

## 2017-04-04 MED ORDER — SODIUM CHLORIDE 0.9 % IV SOLN
INTRAVENOUS | Status: DC | PRN
  Administered 2017-04-04: 08:00:00 via INTRAVENOUS

## 2017-04-04 MED ORDER — ATORVASTATIN CALCIUM 20 MG PO TABS
20.0000 mg | ORAL_TABLET | Freq: Every day | ORAL | Status: DC
  Administered 2017-04-04 – 2017-04-08 (×5): 20 mg via ORAL
  Filled 2017-04-04 (×5): qty 1

## 2017-04-04 MED ORDER — DIPHENHYDRAMINE HCL 25 MG PO CAPS
25.0000 mg | ORAL_CAPSULE | Freq: Every day | ORAL | Status: DC
  Administered 2017-04-04 – 2017-04-07 (×4): 25 mg via ORAL
  Filled 2017-04-04 (×4): qty 1

## 2017-04-04 MED ORDER — SUGAMMADEX SODIUM 200 MG/2ML IV SOLN
INTRAVENOUS | Status: DC | PRN
  Administered 2017-04-04: 200 mg via INTRAVENOUS

## 2017-04-04 MED ORDER — MONTELUKAST SODIUM 10 MG PO TABS
10.0000 mg | ORAL_TABLET | Freq: Every day | ORAL | Status: DC
  Administered 2017-04-04 – 2017-04-07 (×4): 10 mg via ORAL
  Filled 2017-04-04 (×4): qty 1

## 2017-04-04 MED ORDER — ALBUTEROL SULFATE (2.5 MG/3ML) 0.083% IN NEBU
3.0000 mL | INHALATION_SOLUTION | Freq: Four times a day (QID) | RESPIRATORY_TRACT | Status: DC | PRN
  Administered 2017-04-05 – 2017-04-06 (×2): 3 mL via RESPIRATORY_TRACT
  Filled 2017-04-04 (×2): qty 3

## 2017-04-04 MED ORDER — MIDAZOLAM HCL 2 MG/2ML IJ SOLN
INTRAMUSCULAR | Status: AC
  Filled 2017-04-04: qty 2

## 2017-04-04 MED ORDER — PROMETHAZINE HCL 25 MG/ML IJ SOLN: 6.2500 mg | INTRAMUSCULAR | Status: DC | PRN

## 2017-04-04 MED ORDER — GABAPENTIN 300 MG PO CAPS
300.0000 mg | ORAL_CAPSULE | ORAL | Status: AC
  Administered 2017-04-04: 300 mg via ORAL
  Filled 2017-04-04: qty 1

## 2017-04-04 MED ORDER — HYDROMORPHONE HCL 1 MG/ML IJ SOLN
INTRAMUSCULAR | Status: DC | PRN
  Administered 2017-04-04: 0.5 mg via INTRAVENOUS

## 2017-04-04 MED ORDER — FENTANYL CITRATE (PF) 250 MCG/5ML IJ SOLN
INTRAMUSCULAR | Status: AC
  Filled 2017-04-04: qty 5

## 2017-04-04 MED ORDER — UMECLIDINIUM BROMIDE 62.5 MCG/INH IN AEPB
1.0000 | INHALATION_SPRAY | Freq: Every day | RESPIRATORY_TRACT | Status: DC
  Administered 2017-04-05 – 2017-04-08 (×4): 1 via RESPIRATORY_TRACT
  Filled 2017-04-04: qty 7

## 2017-04-04 MED ORDER — VANCOMYCIN HCL 10 G IV SOLR: 1500.0000 mg | Freq: Once | INTRAVENOUS | Status: DC

## 2017-04-04 MED ORDER — HYDROCODONE-ACETAMINOPHEN 7.5-325 MG PO TABS: 1.0000 | ORAL_TABLET | Freq: Once | ORAL | Status: DC | PRN

## 2017-04-04 MED ORDER — ONDANSETRON 4 MG PO TBDP
4.0000 mg | ORAL_TABLET | Freq: Four times a day (QID) | ORAL | Status: DC | PRN
Start: 2017-04-04 — End: 2017-04-08

## 2017-04-04 MED ORDER — LEVOTHYROXINE SODIUM 25 MCG PO TABS
25.0000 ug | ORAL_TABLET | Freq: Every day | ORAL | Status: DC
  Administered 2017-04-05 – 2017-04-08 (×4): 25 ug via ORAL
  Filled 2017-04-04 (×4): qty 1

## 2017-04-04 MED ORDER — METHOCARBAMOL 500 MG PO TABS: 500.0000 mg | ORAL_TABLET | Freq: Four times a day (QID) | ORAL | Status: DC | PRN

## 2017-04-04 MED ORDER — FENTANYL CITRATE (PF) 100 MCG/2ML IJ SOLN
INTRAMUSCULAR | Status: DC | PRN
  Administered 2017-04-04: 100 ug via INTRAVENOUS
  Administered 2017-04-04 (×3): 50 ug via INTRAVENOUS

## 2017-04-04 MED ORDER — HYDROMORPHONE HCL 1 MG/ML IJ SOLN
INTRAMUSCULAR | Status: AC
  Filled 2017-04-04: qty 0.5

## 2017-04-04 MED ORDER — CEFAZOLIN SODIUM-DEXTROSE 2-4 GM/100ML-% IV SOLN: 2.0000 g | INTRAVENOUS | Status: DC

## 2017-04-04 MED ORDER — VANCOMYCIN HCL 10 G IV SOLR
1500.0000 mg | INTRAVENOUS | Status: AC
  Administered 2017-04-04: 1500 mg via INTRAVENOUS
  Filled 2017-04-04: qty 1500

## 2017-04-04 MED ORDER — IPRATROPIUM-ALBUTEROL 0.5-2.5 (3) MG/3ML IN SOLN
3.0000 mL | Freq: Four times a day (QID) | RESPIRATORY_TRACT | Status: DC | PRN
  Administered 2017-04-04 – 2017-04-06 (×4): 3 mL via RESPIRATORY_TRACT
  Filled 2017-04-04 (×4): qty 3

## 2017-04-04 MED ORDER — PROPOFOL 10 MG/ML IV BOLUS
INTRAVENOUS | Status: DC | PRN
  Administered 2017-04-04: 140 mg via INTRAVENOUS

## 2017-04-04 MED ORDER — BUPIVACAINE-EPINEPHRINE 0.25% -1:200000 IJ SOLN
INTRAMUSCULAR | Status: DC | PRN
  Administered 2017-04-04: 25 mL

## 2017-04-04 MED ORDER — OXYCODONE HCL 5 MG PO TABS
5.0000 mg | ORAL_TABLET | ORAL | Status: DC | PRN
  Administered 2017-04-04 – 2017-04-06 (×6): 10 mg via ORAL
  Filled 2017-04-04 (×2): qty 2
  Filled 2017-04-04: qty 1
  Filled 2017-04-04 (×5): qty 2

## 2017-04-04 MED ORDER — PANTOPRAZOLE SODIUM 40 MG PO TBEC
40.0000 mg | DELAYED_RELEASE_TABLET | Freq: Every day | ORAL | Status: DC
  Administered 2017-04-04 – 2017-04-08 (×5): 40 mg via ORAL
  Filled 2017-04-04 (×5): qty 1

## 2017-04-04 MED ORDER — LACTATED RINGERS IV SOLN
INTRAVENOUS | Status: DC | PRN
  Administered 2017-04-04: 07:00:00 via INTRAVENOUS

## 2017-04-04 MED ORDER — MUPIROCIN 2 % EX OINT
1.0000 "application " | TOPICAL_OINTMENT | Freq: Once | CUTANEOUS | Status: AC
  Administered 2017-04-04: 1 via TOPICAL
  Filled 2017-04-04: qty 22

## 2017-04-04 MED ORDER — METHOCARBAMOL 500 MG PO TABS
ORAL_TABLET | ORAL | Status: AC
  Administered 2017-04-04: 500 mg via ORAL
  Filled 2017-04-04: qty 1

## 2017-04-04 MED ORDER — OXYCODONE HCL 5 MG PO TABS: 5.0000 mg | ORAL_TABLET | Freq: Four times a day (QID) | ORAL | 0 refills | Status: DC | PRN

## 2017-04-04 MED ORDER — ONDANSETRON HCL 4 MG/2ML IJ SOLN
INTRAMUSCULAR | Status: DC | PRN
  Administered 2017-04-04: 4 mg via INTRAVENOUS

## 2017-04-04 MED ORDER — METHOCARBAMOL 500 MG PO TABS
500.0000 mg | ORAL_TABLET | Freq: Four times a day (QID) | ORAL | Status: DC | PRN
Start: 2017-04-04 — End: 2017-04-08
  Administered 2017-04-04 – 2017-04-08 (×9): 500 mg via ORAL
  Filled 2017-04-04 (×9): qty 1

## 2017-04-04 MED ORDER — ENOXAPARIN SODIUM 40 MG/0.4ML ~~LOC~~ SOLN
40.0000 mg | SUBCUTANEOUS | Status: DC
  Administered 2017-04-05 – 2017-04-08 (×4): 40 mg via SUBCUTANEOUS
  Filled 2017-04-04 (×4): qty 0.4

## 2017-04-04 MED ORDER — 0.9 % SODIUM CHLORIDE (POUR BTL) OPTIME
TOPICAL | Status: DC | PRN
  Administered 2017-04-04: 1000 mL

## 2017-04-04 MED ORDER — CELECOXIB 200 MG PO CAPS
200.0000 mg | ORAL_CAPSULE | ORAL | Status: AC
  Administered 2017-04-04: 200 mg via ORAL
  Filled 2017-04-04: qty 1

## 2017-04-04 SURGICAL SUPPLY — 41 items
BINDER ABDOMINAL 12 ML 46-62 (SOFTGOODS) ×2 IMPLANT
CANISTER SUCT 3000ML PPV (MISCELLANEOUS) IMPLANT
CHLORAPREP W/TINT 26ML (MISCELLANEOUS) ×2 IMPLANT
COVER SURGICAL LIGHT HANDLE (MISCELLANEOUS) ×2 IMPLANT
DECANTER SPIKE VIAL GLASS SM (MISCELLANEOUS) ×2 IMPLANT
DERMABOND ADVANCED (GAUZE/BANDAGES/DRESSINGS) ×1
DERMABOND ADVANCED .7 DNX12 (GAUZE/BANDAGES/DRESSINGS) ×1 IMPLANT
DEVICE SECURE STRAP 25 ABSORB (INSTRUMENTS) ×6 IMPLANT
DEVICE TROCAR PUNCTURE CLOSURE (ENDOMECHANICALS) ×2 IMPLANT
DRAPE LAPAROSCOPIC ABDOMINAL (DRAPES) ×2 IMPLANT
ELECT REM PT RETURN 9FT ADLT (ELECTROSURGICAL) ×2
ELECTRODE REM PT RTRN 9FT ADLT (ELECTROSURGICAL) ×1 IMPLANT
GLOVE EUDERMIC 7 POWDERFREE (GLOVE) ×2 IMPLANT
GOWN STRL REUS W/ TWL LRG LVL3 (GOWN DISPOSABLE) ×3 IMPLANT
GOWN STRL REUS W/ TWL XL LVL3 (GOWN DISPOSABLE) ×1 IMPLANT
GOWN STRL REUS W/TWL LRG LVL3 (GOWN DISPOSABLE) ×3
GOWN STRL REUS W/TWL XL LVL3 (GOWN DISPOSABLE) ×1
KIT BASIN OR (CUSTOM PROCEDURE TRAY) ×2 IMPLANT
KIT ROOM TURNOVER OR (KITS) ×2 IMPLANT
MARKER SKIN DUAL TIP RULER LAB (MISCELLANEOUS) ×2 IMPLANT
MESH VENTRALIGHT ST 6X8 (Mesh Specialty) ×1 IMPLANT
MESH VENTRLGHT ELLIPSE 8X6XMFL (Mesh Specialty) ×1 IMPLANT
NEEDLE SPNL 22GX3.5 QUINCKE BK (NEEDLE) ×2 IMPLANT
NS IRRIG 1000ML POUR BTL (IV SOLUTION) ×2 IMPLANT
PAD ABD 8X10 STRL (GAUZE/BANDAGES/DRESSINGS) ×4 IMPLANT
PAD ARMBOARD 7.5X6 YLW CONV (MISCELLANEOUS) ×4 IMPLANT
SCISSORS LAP 5X35 DISP (ENDOMECHANICALS) ×2 IMPLANT
SET IRRIG TUBING LAPAROSCOPIC (IRRIGATION / IRRIGATOR) IMPLANT
SHEARS HARMONIC ACE PLUS 36CM (ENDOMECHANICALS) IMPLANT
SLEEVE ENDOPATH XCEL 5M (ENDOMECHANICALS) ×6 IMPLANT
SUT ETHILON 3 0 FSL (SUTURE) ×2 IMPLANT
SUT MON AB 4-0 PC3 18 (SUTURE) ×2 IMPLANT
SUT NOVA NAB DX-16 0-1 5-0 T12 (SUTURE) ×6 IMPLANT
TOWEL OR 17X24 6PK STRL BLUE (TOWEL DISPOSABLE) ×2 IMPLANT
TOWEL OR 17X26 10 PK STRL BLUE (TOWEL DISPOSABLE) ×2 IMPLANT
TRAY FOLEY CATH SILVER 16FR (SET/KITS/TRAYS/PACK) ×2 IMPLANT
TRAY LAPAROSCOPIC MC (CUSTOM PROCEDURE TRAY) ×2 IMPLANT
TROCAR XCEL NON-BLD 11X100MML (ENDOMECHANICALS) ×2 IMPLANT
TROCAR XCEL NON-BLD 5MMX100MML (ENDOMECHANICALS) ×2 IMPLANT
TUBING INSUFFLATION (TUBING) ×2 IMPLANT
WATER STERILE IRR 1000ML POUR (IV SOLUTION) ×2 IMPLANT

## 2017-04-04 NOTE — Transfer of Care (Signed)
Immediate Anesthesia Transfer of Care Note  Patient: Joan [name]  Procedure(s) Performed: LAPAROSCOPIC INCISIONAL HERNIA REPAIR WITH MESH (N/A Abdomen)  Patient Location: PACU  Anesthesia Type:General  Level of Consciousness: awake, alert , oriented and patient cooperative  Airway & Oxygen Therapy: Patient Spontanous Breathing and Patient connected to nasal cannula oxygen  Post-op Assessment: Report given to RN and Post -op Vital signs reviewed and stable  Post vital signs: Reviewed and stable  Last Vitals:  Vitals:   04/04/17 0615  BP: (!) 153/86  Pulse: 72  Resp: 20  Temp: 36.9 C  SpO2: 95%    Last Pain:  Vitals:   04/04/17 0722  TempSrc:   PainSc: 8       Patients Stated Pain Goal: 2 (12/87/86 7672)  Complications: No apparent anesthesia complications

## 2017-04-04 NOTE — Interval H&P Note (Signed)
History and Physical Interval Note:  04/04/2017 6:03 AM  Joan [name]  has presented today for surgery, with the diagnosis of RECURRENT INCISIONAL HERNIA  The various methods of treatment have been discussed with the patient and family. After consideration of risks, benefits and other options for treatment, the patient has consented to  Procedure(s): [place] (N/A) as a surgical intervention .  The patient's history has been reviewed, patient examined, no change in status, stable for surgery.  I have reviewed the patient's chart and labs.  Questions were answered to the patient's satisfaction.     Adin [name]

## 2017-04-04 NOTE — Progress Notes (Signed)
3Pharmacy Antibiotic Note  Joan [name] is a 57 y.o. female admitted on 04/04/2017 with surgical prophylaxis for recurrent MESH infection  vanc 1500 given prior to or this am  Plan: vanc 1250 q12h  Height: 5\' 7"  (170.2 cm) Weight: 218 lb (98.9 kg) IBW/kg (Calculated) : 61.6  Temp (24hrs), Avg:97.9 F (36.6 C), Min:97.2 F (36.2 C), Max:98.4 F (36.9 C)  Recent Labs  Lab 04/04/17 0641  WBC 10.3  CREATININE 1.13*    Estimated Creatinine Clearance: 67.9 mL/min (A) (by C-G formula based on SCr of 1.13 mg/dL (H)).    Allergies  Allergen Reactions  . Dilaudid [Hydromorphone Hcl] Other (See Comments)    "heavy sedation"  . Keflex [Cephalexin] Other (See Comments)    unspecified  . Prednisone Other (See Comments)    "counter reacts"  . Shellfish Allergy Other (See Comments)    Stomach cramps  . Tetracyclines & Related Other (See Comments)    unspecified   Levester Fresh, PharmD, BCPS, BCCCP Clinical Pharmacist Clinical phone for 04/04/2017 from 1430 [phone]: [phone] If after 2300, please call main pharmacy at: [phone] 04/04/2017 6:53 PM

## 2017-04-04 NOTE — Progress Notes (Signed)
Pt. reports that she uses O2 /w Bipap at night (2L). Pt. Reports that she doesn't use Singulair.

## 2017-04-04 NOTE — Telephone Encounter (Signed)
Left message for patient to call back  

## 2017-04-04 NOTE — Op Note (Addendum)
Patient Name:           Joan [name]   Date of Surgery:        04/04/2017  Pre op Diagnosis:      Incarcerated recurrent ventral incisional hernia  Post op Diagnosis:    Same   Procedure:                  laparoscopic lysis of adhesions, closure of recurrent ventral incisional hernia, implantation of mesh  Surgeon:                     Edsel Petrin. Dalbert Batman, M.D., FACS  Assistant:                      OR staff  Operative Indications:    This is a 57 year old female is brought to the operating room for laparoscopic repair of recurrent, incarcerated incisional hernia with mesh  Dr. Chase Caller is her pulmonary medicine doctor. Dr. Sharlet Salina at [facility] [place].Joan [name] is her PCP.      She has probably had 3 periumbilical operations. Umbilical hernia repair with mesh. Mesh disruption and infection and subsequent mesh explantation with primary repair. All in [place].She has a painful recurrence but has never been obstructed or incarcerated. It is still painful and she will really was to have this repaired. She's had a CT scan which shows a 4 cm defect with transverse colon present in the defect.       Comorbidities include significant COPD. Quit smoking in September 2018 on Chantix. Followed by pulmonary medicine. They have cleared her for incisional hernia repair with moderate risk. Diabetes. BMI 34. GERD. .      We going to schedule her for laparoscopic repair of her recurrent incisional hernia with mesh. This may have to be converted to open. It all depends on how dense or significantly adhesions are under the umbilicus. I discussed the indications, details, techniques, and numerous risk of surgery with her.She agrees with this plan.  Operative Findings:       There was a 4 cm abdominal wall defect.  The omentum was incarcerated in this and it tethered the transverse colon up to the level of the abdominal wall but the transverse colon looked fine.  I was able to take all of the adhesions down  and debridement most of the hernia sac.  I closed the defect with interrupted sutures of #1 Novafil.  I then repaired the defect with a 20 x 15 cm inlay mesh, ventralex  ST composite mesh.  Procedure in Detail:          Following the induction of general endotracheal anesthesia, surgical timeout was performed, intravenous antibiotics were given.  Foley catheter was inserted.  The abdomen was prepped and draped in a sterile fashion.  0.5% Marcaine with epinephrine was used as local infiltration anesthetic.     Pneumoperitoneum was created with a Veress needle in the left subcostal region without difficulty.  Optical trocar was inserted.  There is no evidence of any bleeding or injury.  2 additional trochars were placed in the left side and 2 additional trochars were ultimately placed on the right side the adhesions were taken down with cold scissors.  I made a small puncture wound at the umbilicus.  Using an Endo Close device I placed 3 interrupted sutures of #1 Novafil on each side to close the fascia.  I then closed the defect under direct vision and we had complete closure.  A nylon suture was placed to closed this small incision.     I measured the defect.  I found that if I used a 20 cm x 15 cm piece of mesh I would get at least 7 cm overlap in all directions.  This mesh was brought to the operative field.  I drew a template on the abdominal wall and used 8 suture fixation sites.  I used #1 Novafil.  I was very careful to mark the mesh so that the rough side was up toward the abdominal wall and the smooth side was down toward the viscera.  After all 8 sutures were placed in the mesh I moistened the mesh, rolled it up and inserted it into the abdominal cavity.  The mesh was then opened up and positioned.  At each of the suture fixation sites I made a small puncture wound and passed the Novafil sutures through the abdominal wall, being careful to take a 1 cm bite of tissue.  After all sutures were placed I  lifted it is this up and it looked good with very little redundancy in the mesh.  I tied all 8 sutures.  The mesh was then further secured to the abdominal wall with the secure strap device, double crown technique, 65 or 70 findings of the secure strap device.  Everything looked good.  There was no bleeding or defect.     The trochars were removed under direct vision.  There was no bleeding.  The pneumoperitoneum was released.  All the skin incisions were closed with subcuticular sutures of 4-0 Monocryl and Dermabond.  Dry bandages and abdominal binder were placed.  The patient tolerated the procedure well was taken to PACU in stable condition.  EBL 20 mL.  Counts correct.  Complications none   Addendum: I logged onto the [facility] website and reviewed her prescription medication history  Joan [name] M. Dalbert Batman, M.D., FACS General and Minimally Invasive Surgery Breast and Colorectal Surgery  04/04/2017 9:17 AM

## 2017-04-04 NOTE — Anesthesia Procedure Notes (Addendum)
Procedure Name: Intubation Date/Time: 04/04/2017 7:43 AM Performed by: Raenette Rover, CRNA Pre-anesthesia Checklist: Patient identified, Emergency Drugs available, Suction available and Patient being monitored Patient Re-evaluated:Patient Re-evaluated prior to induction Oxygen Delivery Method: Circle system utilized Preoxygenation: Pre-oxygenation with 100% oxygen Induction Type: IV induction Ventilation: Mask ventilation without difficulty Laryngoscope Size: Miller and 2 Grade View: Grade II Tube type: Oral Tube size: 7.0 mm Number of attempts: 1 Airway Equipment and Method: Stylet Placement Confirmation: ETT inserted through vocal cords under direct vision,  positive ETCO2,  CO2 detector and breath sounds checked- equal and bilateral Secured at: 22 cm Tube secured with: Tape Dental Injury: Teeth and Oropharynx as per pre-operative assessment  Comments: Larynx was anterior and to the left.

## 2017-04-04 NOTE — Anesthesia Postprocedure Evaluation (Signed)
Anesthesia Post Note  Patient: Joan [name]  Procedure(s) Performed: LAPAROSCOPIC INCISIONAL HERNIA REPAIR WITH MESH (N/A Abdomen)     Patient location during evaluation: PACU Anesthesia Type: General Level of consciousness: awake and alert Pain management: pain level controlled Vital Signs Assessment: post-procedure vital signs reviewed and stable Respiratory status: spontaneous breathing, nonlabored ventilation, respiratory function stable and patient connected to nasal cannula oxygen Cardiovascular status: blood pressure returned to baseline and stable Postop Assessment: no apparent nausea or vomiting Anesthetic complications: no    Last Vitals:  Vitals:   04/04/17 1307 04/04/17 1322  BP: 133/83 121/72  Pulse: 67 67  Resp: 13 14  Temp:    SpO2: 93% (!) 88%    Last Pain:  Vitals:   04/04/17 1240  TempSrc:   PainSc: 0-No pain                 Barnet Glasgow

## 2017-04-05 DIAGNOSIS — M159 Polyosteoarthritis, unspecified: Secondary | ICD-10-CM | POA: Insufficient documentation

## 2017-04-05 DIAGNOSIS — M8949 Other hypertrophic osteoarthropathy, multiple sites: Secondary | ICD-10-CM | POA: Insufficient documentation

## 2017-04-05 DIAGNOSIS — M150 Primary generalized (osteo)arthritis: Secondary | ICD-10-CM

## 2017-04-05 DIAGNOSIS — R7303 Prediabetes: Secondary | ICD-10-CM | POA: Insufficient documentation

## 2017-04-05 DIAGNOSIS — E034 Atrophy of thyroid (acquired): Secondary | ICD-10-CM | POA: Insufficient documentation

## 2017-04-05 LAB — GLUCOSE, CAPILLARY
Glucose-Capillary: 168 mg/dL — ABNORMAL HIGH (ref 65–99)
Glucose-Capillary: 157 mg/dL — ABNORMAL HIGH (ref 65–99)
Glucose-Capillary: 123 mg/dL — ABNORMAL HIGH (ref 65–99)

## 2017-04-05 MED ORDER — NITROGLYCERIN 0.4 MG SL SUBL
SUBLINGUAL_TABLET | SUBLINGUAL | Status: AC
  Filled 2017-04-05: qty 1

## 2017-04-05 MED ORDER — ALUM & MAG HYDROXIDE-SIMETH 200-200-20 MG/5ML PO SUSP
15.0000 mL | Freq: Once | ORAL | Status: AC
  Administered 2017-04-05: 15 mL via ORAL
  Filled 2017-04-05: qty 30

## 2017-04-05 MED ORDER — NAPROXEN 250 MG PO TABS
500.0000 mg | ORAL_TABLET | Freq: Two times a day (BID) | ORAL | Status: DC
  Administered 2017-04-05 – 2017-04-08 (×6): 500 mg via ORAL
  Filled 2017-04-05 (×6): qty 2

## 2017-04-05 MED ORDER — DIPHENHYDRAMINE HCL 50 MG/ML IJ SOLN
12.5000 mg | Freq: Three times a day (TID) | INTRAMUSCULAR | Status: DC | PRN
  Administered 2017-04-05 (×2): 12.5 mg via INTRAVENOUS
  Filled 2017-04-05 (×3): qty 1

## 2017-04-05 NOTE — Significant Event (Signed)
Rapid Response Event Note  Overview: Called by RN about patient having CP. I asked RN to get an EKG.  Initial Focused Assessment: Patient was awake, alert, stated that her chest pain was 7/10 left lateral chest, under her breast, upper epigastric region, patient stated it felt like band across her chest/upper abdomen, mild wheezing but otherwise lungs were clear, not in acute distress, skin and warm, + pulses. VS stable except SBP in the 180s and patient stated she has a HA as well.    Interventions: -- EKG - not acute in my opinion -- DuoNeb x 1 -- Fentanyl 67mcg IV (already ordered). After this dose, CP and HA were completely gone and SBP improved to 160s.  -- I spoke with MD on call, updated MD, will order x 1 of Maalox/Mylanta for + bloating  -- Patient ambulated in room, + burping, + cough, good air movement, has nightly CPAP in the room if needed.  Plan of Care (if not transferred): -- ambulate and pulmonary toileting -- Patient requesting PT/OT -- Call RRT if needed.   Event Summary:   at     End Time  2315  Denae Zulueta R

## 2017-04-05 NOTE — Progress Notes (Signed)
1 Day Post-Op   Subjective/Chief Complaint: Complains of pain   Objective: Vital signs in last 24 hours: Temp:  [97.2 F (36.2 C)-98.4 F (36.9 C)] 97.9 F (36.6 C) (02/09 0700) Pulse Rate:  [65-82] 82 (02/09 0700) Resp:  [10-19] 17 (02/09 0700) BP: (121-183)/(69-107) 131/70 (02/09 0700) SpO2:  [88 %-96 %] 93 % (02/09 0700) Last BM Date: 04/03/17  Intake/Output from previous day: 02/08 0701 - 02/09 0700 In: 3192.8 [I.V.:2692.8; IV Piggyback:500] Out: 8185 [Urine:1700; Blood:5] Intake/Output this shift: No intake/output data recorded.  General appearance: alert and cooperative Resp: rhonchi bilaterally Cardio: regular rate and rhythm GI: very tender. incisions look good  Lab Results:  Recent Labs    04/04/17 0641  WBC 10.3  HGB 12.4  HCT 38.3  PLT 254   BMET Recent Labs    04/04/17 0641  NA 139  K 3.8  CL 105  CO2 19*  GLUCOSE 126*  BUN 15  CREATININE 1.13*  CALCIUM 8.9   PT/INR No results for input(s): LABPROT, INR in the last 72 hours. ABG No results for input(s): PHART, HCO3 in the last 72 hours.  Invalid input(s): PCO2, PO2  Studies/Results: No results found.  Anti-infectives: Anti-infectives (From admission, onward)   Start     Dose/Rate Route Frequency Ordered Stop   04/05/17 1000  valACYclovir (VALTREX) tablet 500 mg     500 mg Oral Daily 04/04/17 1032     04/04/17 2000  vancomycin (VANCOCIN) 1,250 mg in sodium chloride 0.9 % 250 mL IVPB     1,250 mg 166.7 mL/hr over 90 Minutes Intravenous Every 12 hours 04/04/17 1852     04/04/17 0715  vancomycin (VANCOCIN) 1,500 mg in sodium chloride 0.9 % 500 mL IVPB  Status:  Discontinued     1,500 mg 250 mL/hr over 120 Minutes Intravenous  Once 04/04/17 0707 04/04/17 0708   04/04/17 0715  vancomycin (VANCOCIN) 1,500 mg in sodium chloride 0.9 % 500 mL IVPB     1,500 mg 500 mL/hr over 60 Minutes Intravenous To Surgery 04/04/17 0707 04/04/17 0815   04/04/17 0631  ceFAZolin (ANCEF) IVPB 2g/100 mL  premix  Status:  Discontinued     2 g 200 mL/hr over 30 Minutes Intravenous On call to O.R. 04/04/17 0631 04/04/17 0706      Assessment/Plan: s/p Procedure(s): LAPAROSCOPIC INCISIONAL HERNIA REPAIR WITH MESH (N/A) Advance diet  Will work on pain control today. Not sure she has been getting pain meds on schedule so will work on that Leave foley in until tomorrow Ambulate Inhalers and pulm toilet  LOS: 0 days    TOTH III,Jp Eastham S 04/05/2017

## 2017-04-05 NOTE — Progress Notes (Signed)
Patient refused to have foley cath removed this morning until she speaks to MD.

## 2017-04-06 LAB — GLUCOSE, CAPILLARY
Glucose-Capillary: 118 mg/dL — ABNORMAL HIGH (ref 65–99)
Glucose-Capillary: 184 mg/dL — ABNORMAL HIGH (ref 65–99)
Glucose-Capillary: 133 mg/dL — ABNORMAL HIGH (ref 65–99)
Glucose-Capillary: 121 mg/dL — ABNORMAL HIGH (ref 65–99)

## 2017-04-06 MED ORDER — INSULIN ASPART 100 UNIT/ML ~~LOC~~ SOLN
0.0000 [IU] | Freq: Three times a day (TID) | SUBCUTANEOUS | Status: DC
  Administered 2017-04-06: 2 [IU] via SUBCUTANEOUS

## 2017-04-06 MED ORDER — ALUM & MAG HYDROXIDE-SIMETH 200-200-20 MG/5ML PO SUSP
30.0000 mL | ORAL | Status: DC | PRN
  Administered 2017-04-06 – 2017-04-08 (×4): 30 mL via ORAL
  Filled 2017-04-06 (×4): qty 30

## 2017-04-06 NOTE — Progress Notes (Signed)
2 Days Post-Op   Subjective/Chief Complaint: Complains of burping and gas pains. Seems to be better today   Objective: Vital signs in last 24 hours: Temp:  [97.9 F (36.6 C)-98.2 F (36.8 C)] 98.2 F (36.8 C) (02/10 0700) Pulse Rate:  [81-94] 88 (02/10 0700) Resp:  [17-18] 17 (02/10 0517) BP: (138-187)/(74-102) 169/88 (02/10 0700) SpO2:  [90 %-97 %] 97 % (02/10 0700) Last BM Date: 04/03/17  Intake/Output from previous day: 02/09 0701 - 02/10 0700 In: 2527 [P.O.:1202; I.V.:825; IV Piggyback:500] Out: 2683 [Urine:3750] Intake/Output this shift: No intake/output data recorded.  General appearance: alert and cooperative Resp: rhonchi bilaterally Cardio: regular rate and rhythm GI: seems to softer and less tender  Lab Results:  Recent Labs    04/04/17 0641  WBC 10.3  HGB 12.4  HCT 38.3  PLT 254   BMET Recent Labs    04/04/17 0641  NA 139  K 3.8  CL 105  CO2 19*  GLUCOSE 126*  BUN 15  CREATININE 1.13*  CALCIUM 8.9   PT/INR No results for input(Joan [name]): LABPROT, INR in the last 72 hours. ABG No results for input(Joan [name]): PHART, HCO3 in the last 72 hours.  Invalid input(Joan [name]): PCO2, PO2  Studies/Results: No results found.  Anti-infectives: Anti-infectives (From admission, onward)   Start     Dose/Rate Route Frequency Ordered Stop   04/05/17 1000  valACYclovir (VALTREX) tablet 500 mg     500 mg Oral Daily 04/04/17 1032     04/04/17 2000  vancomycin (VANCOCIN) 1,250 mg in sodium chloride 0.9 % 250 mL IVPB     1,250 mg 166.7 mL/hr over 90 Minutes Intravenous Every 12 hours 04/04/17 1852     04/04/17 0715  vancomycin (VANCOCIN) 1,500 mg in sodium chloride 0.9 % 500 mL IVPB  Status:  Discontinued     1,500 mg 250 mL/hr over 120 Minutes Intravenous  Once 04/04/17 0707 04/04/17 0708   04/04/17 0715  vancomycin (VANCOCIN) 1,500 mg in sodium chloride 0.9 % 500 mL IVPB     1,500 mg 500 mL/hr over 60 Minutes Intravenous To Surgery 04/04/17 0707 04/04/17 0815   04/04/17  0631  ceFAZolin (ANCEF) IVPB 2g/100 mL premix  Status:  Discontinued     2 g 200 mL/hr over 30 Minutes Intravenous On call to O.R. 04/04/17 0631 04/04/17 0706      Assessment/Plan: Joan [name]/p Procedure(Joan [name]): LAPAROSCOPIC INCISIONAL HERNIA REPAIR WITH MESH (N/A) Advance diet  Continue to work on pain control mylanta for gas and burping ambulate  LOS: 0 days    Joan Joan [name],Joan Joan [name] 04/06/2017

## 2017-04-06 NOTE — Progress Notes (Signed)
Pt. I alert and oriented,spox 97%,ambulated to the bath room twice and slept on C-pap.

## 2017-04-06 NOTE — Progress Notes (Signed)
Family concerned about iv fentanyl and voice that pt has a substance abuse history. Will use po pain medicine only until next review by MD

## 2017-04-06 NOTE — Progress Notes (Signed)
Pt. awake, alert, stated that her chest pain was 7/10 chest pain feels tight and pressured  in her upper epigastric region, patient stated it felt like band across her chest., mild wheezing but otherwise lungs were clear, not in acute distress.When the nurse arrived pt. was in room air she forgot to wear her nasal cannula,PSO2 78%.BP was high 180/89,and pt,state having head ache.

## 2017-04-07 ENCOUNTER — Encounter (HOSPITAL_COMMUNITY): Payer: Self-pay | Admitting: General Surgery

## 2017-04-07 LAB — BASIC METABOLIC PANEL
Anion gap: 10 (ref 5–15)
BUN: 5 mg/dL — ABNORMAL LOW (ref 6–20)
CO2: 25 mmol/L (ref 22–32)
Calcium: 8.5 mg/dL — ABNORMAL LOW (ref 8.9–10.3)
Chloride: 106 mmol/L (ref 101–111)
Creatinine, Ser: 0.9 mg/dL (ref 0.44–1.00)
GFR calc Af Amer: >60 mL/min (ref >60)
GFR calc non Af Amer: >60 mL/min (ref >60)
Glucose, Bld: 150 mg/dL — ABNORMAL HIGH (ref 65–99)
Potassium: 3.4 mmol/L — ABNORMAL LOW (ref 3.5–5.1)
Sodium: 141 mmol/L (ref 135–145)

## 2017-04-07 LAB — CBC
HCT: 33.5 % — ABNORMAL LOW (ref 36.0–46.0)
Hemoglobin: 10.7 g/dL — ABNORMAL LOW (ref 12.0–15.0)
MCH: 31.5 pg (ref 26.0–34.0)
MCHC: 31.9 g/dL (ref 30.0–36.0)
MCV: 98.5 fL (ref 78.0–100.0)
Platelets: 209 10*3/uL (ref 150–400)
RBC: 3.4 MIL/uL — ABNORMAL LOW (ref 3.87–5.11)
RDW: 13.3 % (ref 11.5–15.5)
WBC: 9 10*3/uL (ref 4.0–10.5)

## 2017-04-07 LAB — GLUCOSE, CAPILLARY
Glucose-Capillary: 129 mg/dL — ABNORMAL HIGH (ref 65–99)
Glucose-Capillary: 133 mg/dL — ABNORMAL HIGH (ref 65–99)

## 2017-04-07 MED ORDER — TRAMADOL HCL 50 MG PO TABS
50.0000 mg | ORAL_TABLET | Freq: Four times a day (QID) | ORAL | Status: DC | PRN
  Administered 2017-04-08: 50 mg via ORAL
  Filled 2017-04-07: qty 1

## 2017-04-07 MED ORDER — POLYETHYLENE GLYCOL 3350 17 G PO PACK
17.0000 g | PACK | Freq: Four times a day (QID) | ORAL | Status: DC
  Administered 2017-04-07 – 2017-04-08 (×3): 17 g via ORAL
  Filled 2017-04-07 (×3): qty 1

## 2017-04-07 MED ORDER — ACETAMINOPHEN 325 MG PO TABS: 650.0000 mg | ORAL_TABLET | Freq: Four times a day (QID) | ORAL | Status: DC | PRN

## 2017-04-07 NOTE — Progress Notes (Signed)
   04/07/17 1000  Clinical Encounter Type  Visited With Patient;Health care provider  Visit Type Initial  Referral From Nurse  Consult/Referral To Chaplain  Spiritual Encounters  Spiritual Needs Prayer  Stress Factors  Patient Stress Factors Health changes   Responded to a Scc for prayer.  Spoke with the nurse prior to entering.  Patient alone, so ok to come visit.  Patient indicated she may go home tomorrow.  Lives with her brother.  Says she is doing ok today, but says it is hard to be tired all the time.  Moved down here in June of 18.  We prayed together.   Will follow as needed. Chaplain Katherene Ponto

## 2017-04-07 NOTE — Progress Notes (Signed)
Pharmacy Antibiotic Note  Joan [name] is a 57 y.o. female admitted on 04/04/2017. Pharmacy consulted for vancomycin dosing s/p lap repair of incisional hernia with mesh for recurrent infection. Renal function is stable. She is afebrile, WBC are normal, and there is no culture data.  Plan: Continue vancomycin 1250 mg IV q12h, goal 10-15 mcg/ml Monitor renal function Discharge planning in process so will hold off trough for now  Height: 5\' 7"  (170.2 cm) Weight: 218 lb (98.9 kg) IBW/kg (Calculated) : 61.6  Temp (24hrs), Avg:97.8 F (36.6 C), Min:97.4 F (36.3 C), Max:98.1 F (36.7 C)  Recent Labs  Lab 04/04/17 0641 04/07/17 0750  WBC 10.3 9.0  CREATININE 1.13* 0.90    Estimated Creatinine Clearance: 85.3 mL/min (by C-G formula based on SCr of 0.9 mg/dL).    Allergies  Allergen Reactions  . Dilaudid [Hydromorphone Hcl] Other (See Comments)    "heavy sedation"  . Keflex [Cephalexin] Other (See Comments)    unspecified  . Prednisone Other (See Comments)    "counter reacts"  . Shellfish Allergy Other (See Comments)    Stomach cramps  . Tetracyclines & Related Other (See Comments)    unspecified    Renold Genta, PharmD, BCPS Clinical Pharmacist Phone for today - [place] - [phone] 04/07/2017 11:07 AM

## 2017-04-07 NOTE — Progress Notes (Signed)
3 Days Post-Op  Subjective: Stable and alert. Does not appear to be in any distress Says she is ambulating and voiding. Tolerating diet but not much appetite Denies stool or flatus CBGs range 123-184. Will dc cbg monitoring  Nursing notes document that family concerned about IV fentabyl and stated that patient has a substance abuse history. For this reason we have discontinued oxycodone and fentanyl and will allow tramadol, naproxen, Tylenol.  Patient was told we were going to cut back on her narcotics  We discussed discharge planning.  At first she wanted to go home today but when I told her I was going to give her some laxatives she wanted to wait until tomorrow.  Objective: Vital signs in last 24 hours: Temp:  [97.4 F (36.3 C)-98.1 F (36.7 C)] 98 F (36.7 C) (02/11 0436) Pulse Rate:  [76-84] 76 (02/11 0436) Resp:  [16] 16 (02/11 0436) BP: (130-139)/(76-78) 132/78 (02/11 0436) SpO2:  [90 %-99 %] 90 % (02/11 0436) Last BM Date: 04/03/17  Intake/Output from previous day: 02/10 0701 - 02/11 0700 In: 1200 [P.O.:600; I.V.:600] Out: -  Intake/Output this shift: No intake/output data recorded.  General appearance: lert.  No obvious distress.  Kicking large iced coffee.  Cooperative Resp: clear to auscultation bilaterally GI: soft.  Hypoactive bowel sounds.  Wounds clean.  Some obesity.  Lab Results:  Results for orders placed or performed during the hospital encounter of 04/04/17 (from the past 24 hour(s))  Glucose, capillary     Status: Abnormal   Collection Time: 04/06/17  7:39 AM  Result Value Ref Range   Glucose-Capillary 118 (H) 65 - 99 mg/dL  Glucose, capillary     Status: Abnormal   Collection Time: 04/06/17 12:06 PM  Result Value Ref Range   Glucose-Capillary 184 (H) 65 - 99 mg/dL  Glucose, capillary     Status: Abnormal   Collection Time: 04/06/17  4:34 PM  Result Value Ref Range   Glucose-Capillary 133 (H) 65 - 99 mg/dL  Glucose, capillary     Status:  Abnormal   Collection Time: 04/06/17  9:00 PM  Result Value Ref Range   Glucose-Capillary 121 (H) 65 - 99 mg/dL   Comment 1 Notify RN    Comment 2 Document in Chart      Studies/Results: No results found.  Marland Kitchen atenolol  25 mg Oral Daily  . atorvastatin  20 mg Oral Daily  . baclofen  10 mg Oral QHS  . diphenhydrAMINE  25 mg Oral QHS  . enoxaparin (LOVENOX) injection  40 mg Subcutaneous Q24H  . insulin aspart  0-15 Units Subcutaneous TID WC  . levothyroxine  25 mcg Oral QAC breakfast  . mometasone-formoterol  2 puff Inhalation BID  . montelukast  10 mg Oral QHS  . naproxen  500 mg Oral BID WC  . pantoprazole  40 mg Oral Daily  . polyethylene glycol  17 g Oral QID  . pregabalin  75 mg Oral BID  . roflumilast  500 mcg Oral Daily  . senna  1 tablet Oral BID  . umeclidinium bromide  1 puff Inhalation Daily  . valACYclovir  500 mg Oral Daily  . varenicline  1 mg Oral BID  . venlafaxine XR  150 mg Oral Q breakfast     Assessment/Plan: s/p Procedure(s): LAPAROSCOPIC INCISIONAL HERNIA REPAIR WITH MESH   POD#3 - laparoscopic incisional hernia repair with mesh Slow progress due to pain issues but otherwise doing okay Discontinue fentanyl Discontinue oxycodone Allow tramadol, naproxen, Tylenol  miraLAX today Home tomorrow.  She lives with her brother  MRSA positive screen - history mesh infection Vancomycin was continued over weekend but will discontinue now  Continuous tobacco abuse COPD, moderate Borderline diabetes.  CBC is well controlled BMI 32 History of substance abuse per family    @PROBHOSP @  LOS: 0 days    Adin [name] 04/07/2017  . .prob

## 2017-04-08 ENCOUNTER — Telehealth: Payer: Self-pay | Admitting: *Deleted

## 2017-04-08 MED ORDER — TRAMADOL HCL 50 MG PO TABS: 50.0000 mg | ORAL_TABLET | Freq: Four times a day (QID) | ORAL | 0 refills | Status: DC | PRN

## 2017-04-08 NOTE — Progress Notes (Signed)
Patient given discharge instructions and verbalized understanding. Patient left unit in stable condition via wheelchair with home oxygen and personal belongings.

## 2017-04-08 NOTE — Telephone Encounter (Signed)
Attempted to call pt but no answer.  Left message for pt to return our call x2 

## 2017-04-08 NOTE — Discharge Summary (Signed)
Patient ID: Joan [name] [id number] 57 y.o. December 03, 1960  Admit date: 04/04/2017  Discharge date and time: 04/08/2017  Admitting Physician: Adin [name]  Discharge Physician: Adin [name]  Admission Diagnoses: RECURRENT INCISIONAL HERNIA  Discharge Diagnoses: recurrent incisional hernia                                         Continuous tobacco abuse                                          COPD, moderate                                          Chronic GERD                                          Borderline diabetes                                          BMI 32                                          Hypothyroidism, adult  Operations: Procedure(s): LAPAROSCOPIC INCISIONAL HERNIA REPAIR WITH MESH  Admission Condition: good  Discharged Condition: good  Indication for Admission:    This is a 57 year old female is brought to the hospital for elective laparoscopic repair of recurrent, incarcerated incisional hernia with mesh  Dr. Chase Caller is her pulmonary medicine doctor. Dr. Sharlet Salina at [facility].[name] is her PCP.      She has probably had 3 periumbilical operations. Umbilical hernia repair with mesh. Mesh disruption and infection and subsequent mesh explantation with primary repair. All in [place].She has a painful recurrence but has never been obstructed or incarcerated. It is still painful and she will really was to have this repaired. She's had a CT scan which shows a 4 cm defect with transverse colon present in the defect.       Comorbidities include significant COPD. Quit smoking in September 2018 on Chantix. Followed by pulmonary medicine. They have cleared her for incisional hernia repair with moderate risk. Diabetes. BMI 34. GERD. .      We going to schedule her for laparoscopic repair of her recurrent incisional hernia with mesh. This may have to be converted to open. It all depends on how dense or significantly adhesions are under the umbilicus.  I discussed the indications, details, techniques, and numerous risk of surgery with her.She agrees with this plan.    Hospital Course: on the day of admission the patient was taken to the operating room and underwent laparoscopic repair of her recurrent incisional hernia.  Adhesions were taken down, the fascial defect was closed laparoscopically, and a large piece of composite mesh was placed as an inlay.  The surgery was uneventful     Postoperatively she had lots of issues with pain control but no  apparent complications.  Her family reported and the nursing staff documented that she had a history of substance abuse in the past.  We slowly wean her off of all narcotics and switched her to naproxen and tramadol.  She was constipated.  With MiraLAX and began having bowel movements.  On the day of discharge she said she was ready to go home.  She had had a bowel movement the previous day.  Although appetite was not great she was tolerating diet without nausea or reflux symptoms.  Examination of the day of discharge revealed her lungs were reasonably clear anteriorly.  Afebrile.  Vital signs stable. SPO2 95-100%. Abdomen was obese.  Soft.  Active bowel sounds.  All wounds clean.      He was given a prescription for tramadol.  She was told to continue all of her usual medications.  She was given instructions in diet and activities.  I advised her to see her pulmonary medicine doctor within one week.  Advised her to return to see me in 2 weeks.  Consults: None  Significant Diagnostic Studies: Lab work  Treatments: surgery: laparoscopic repair of recurrent incisional hernia with mesh  Disposition: Home  Patient Instructions:  Allergies as of 04/08/2017      Reactions   Dilaudid [hydromorphone Hcl] Other (See Comments)   "heavy sedation"   Keflex [cephalexin] Other (See Comments)   unspecified   Prednisone Other (See Comments)   "counter reacts"   Shellfish Allergy Other (See Comments)   Stomach  cramps   Tetracyclines & Related Other (See Comments)   unspecified      Medication List    TAKE these medications   albuterol 108 (90 Base) MCG/ACT inhaler Commonly known as:  PROVENTIL HFA;VENTOLIN HFA Inhale 2 puffs into the lungs every 6 (six) hours as needed for wheezing or shortness of breath (before activity).   aspirin EC 325 MG tablet Take 1 tablet (325 mg total) by mouth 2 (two) times daily.   atenolol 25 MG tablet Commonly known as:  TENORMIN Take 1 tablet (25 mg total) by mouth daily.   atorvastatin 20 MG tablet Commonly known as:  LIPITOR Take 20 mg by mouth daily.   baclofen 10 MG tablet Commonly known as:  LIORESAL Take 10 mg by mouth at bedtime.   budesonide-formoterol 160-4.5 MCG/ACT inhaler Commonly known as:  SYMBICORT Inhale 2 puffs into the lungs 2 (two) times daily.   CHANTIX CONTINUING MONTH PAK 1 MG tablet Generic drug:  varenicline TAKE 1 TABLET BY MOUTH TWICE A DAY   diphenhydrAMINE 25 mg capsule Commonly known as:  BENADRYL Take 25 mg by mouth at bedtime.   ipratropium-albuterol 0.5-2.5 (3) MG/3ML Soln Commonly known as:  DUONEB Take 3 mLs by nebulization every 6 (six) hours as needed (for wheezing/shortness of breath).   levothyroxine 25 MCG tablet Commonly known as:  SYNTHROID, LEVOTHROID Take 1 tablet (25 mcg total) by mouth daily before breakfast.   methocarbamol 500 MG tablet Commonly known as:  ROBAXIN Take 500 mg by mouth every 6 (six) hours as needed for muscle spasms.   montelukast 10 MG tablet Commonly known as:  SINGULAIR Take 1 tablet (10 mg total) by mouth at bedtime.   naproxen 500 MG tablet Commonly known as:  NAPROSYN Take 1 tablet (500 mg total) by mouth 2 (two) times daily with a meal.   omeprazole 20 MG capsule Commonly known as:  PRILOSEC Take 20 mg by mouth daily before breakfast.   oxyCODONE 5 MG  immediate release tablet Commonly known as:  Oxy IR/ROXICODONE Take 1-2 tablets (5-10 mg total) by mouth  every 6 (six) hours as needed for severe pain.   pregabalin 75 MG capsule Commonly known as:  LYRICA Take 1 capsule (75 mg total) by mouth 2 (two) times daily.   roflumilast 500 MCG Tabs tablet Commonly known as:  DALIRESP Take 1 tablet (500 mcg total) by mouth daily.   traMADol 50 MG tablet Commonly known as:  ULTRAM Take 1 tablet (50 mg total) by mouth every 6 (six) hours as needed for moderate pain.   umeclidinium bromide 62.5 MCG/INH Aepb Commonly known as:  INCRUSE ELLIPTA Inhale 1 puff into the lungs daily.   valACYclovir 500 MG tablet Commonly known as:  VALTREX Take 500 mg by mouth daily.   venlafaxine XR 150 MG 24 hr capsule Commonly known as:  EFFEXOR-XR Take 1 capsule (150 mg total) by mouth daily with breakfast.       Activity: no sports or heavy lifting for 6 weeks. Diet: diabetic diet Wound Care: none needed  Follow-up:  With Dr. Dalbert Batman in 2 weeks.  Signed: Edsel Petrin. Dalbert Batman, M.D., FACS General and minimally invasive surgery Breast and Colorectal Surgery    Addendum: I logged onto the [facility] website and reviewed her prescription medication history   04/08/2017, 6:11 AM

## 2017-04-08 NOTE — Care Management Note (Signed)
Case Management Note  Patient Details  Name: Joan [name] MRN: [id number] Date of Birth: 1960/04/05  Subjective/Objective:                    Action/Plan:  Received orders for HHRN,PT,aide Sharmon Revere with [facility] aware  Expected Discharge Date:  04/08/17               Expected Discharge Plan:  [place]  In-House Referral:     Discharge planning Services  CM Consult  Post Acute Care Choice:    Choice offered to:  Patient  DME Arranged:  N/A DME Agency:  NA  HH Arranged:  RN, PT, Nurse's Aide [place] Agency:  [place]  Status of Service:  Completed, signed off  If discussed at [place] of Stay Meetings, dates discussed:    Additional Comments:  Marilu Favre, RN 04/08/2017, 12:10 PM

## 2017-04-08 NOTE — Discharge Instructions (Signed)
[facility] ______[facility] °LAPAROSCOPIC SURGERY: POST OP INSTRUCTIONS °Always review your discharge instruction sheet given to you by the facility where your surgery was performed. °IF YOU HAVE DISABILITY OR FAMILY LEAVE FORMS, YOU MUST BRING THEM TO THE OFFICE FOR PROCESSING.   °DO NOT GIVE THEM TO YOUR DOCTOR. ° °1. A prescription for pain medication may be given to you upon discharge.  Take your pain medication as prescribed, if needed.  If narcotic pain medicine is not needed, then you may take acetaminophen (Tylenol) or ibuprofen (Advil) as needed. °2. Take your usually prescribed medications unless otherwise directed. °3. If you need a refill on your pain medication, please contact your pharmacy.  They will contact our office to request authorization. Prescriptions will not be filled after 5pm or on week-ends. °4. You should follow a light diet the first few days after arrival home, such as soup and crackers, etc.  Be sure to include lots of fluids daily. °5. Most patients will experience some swelling and bruising in the area of the incisions.  Ice packs will help.  Swelling and bruising can take several days to resolve.  °6. It is common to experience some constipation if taking pain medication after surgery.  Increasing fluid intake and taking a stool softener (such as Colace) will usually help or prevent this problem from occurring.  A mild laxative (Milk of Magnesia or Miralax) should be taken according to package instructions if there are no bowel movements after 48 hours. °7. Unless discharge instructions indicate otherwise, you may remove your bandages 24-48 hours after surgery, and you may shower at that time.  You may have steri-strips (small skin tapes) in place directly over the incision.  These strips should be left on the skin for 7-10 days.  If your surgeon used skin glue on the incision, you may shower in 24 hours.  The glue will flake off over the next 2-3 weeks.  Any sutures or  staples will be removed at the office during your follow-up visit. °8. ACTIVITIES:  You may resume regular (light) daily activities beginning the next day--such as daily self-care, walking, climbing stairs--gradually increasing activities as tolerated.  You may have sexual intercourse when it is comfortable.  Refrain from any heavy lifting or straining until approved by your doctor. °a. You may drive when you are no longer taking prescription pain medication, you can comfortably wear a seatbelt, and you can safely maneuver your car and apply brakes. °b. RETURN TO WORK:  __________________________________________________________ °9. You should see your doctor in the office for a follow-up appointment approximately 2-3 weeks after your surgery.  Make sure that you call for this appointment within a day or two after you arrive home to insure a convenient appointment time. °10. OTHER INSTRUCTIONS: __________________________________________________________________________________________________________________________ __________________________________________________________________________________________________________________________ °WHEN TO CALL YOUR DOCTOR: °1. Fever over 101.0 °2. Inability to urinate °3. Continued bleeding from incision. °4. Increased pain, redness, or drainage from the incision. °5. Increasing abdominal pain ° °The clinic staff is available to answer your questions during regular business hours.  Please don’t hesitate to call and ask to speak to one of the nurses for clinical concerns.  If you have a medical emergency, go to the nearest emergency room or call 911.  A surgeon from [facility] is always on call at the hospital. °[address], [address], [place], [place]  [address] ? [address] [address], [place], [place]   [address] °[phone] ? [phone] ? FAX [phone] °Web site:   [email or web address] °

## 2017-04-08 NOTE — Care Management Note (Signed)
Case Management Note  Patient Details  Name: Joan [name] MRN: [id number] Date of Birth: 1960-06-26  Subjective/Objective:                    Action/Plan:  Patient requesting home health RN, PT, and aide through [facility] , called Dr Audie Pinto office awaiting call back. Expected Discharge Date:  04/08/17               Expected Discharge Plan:     In-House Referral:     Discharge planning Services  CM Consult  Post Acute Care Choice:    Choice offered to:  Patient  DME Arranged:  N/A DME Agency:  NA  HH Arranged:    [place] Agency:  [place]  Status of Service:  In process, will continue to follow  If discussed at Long [place] of Stay Meetings, dates discussed:    Additional Comments:  Marilu Favre, RN 04/08/2017, 12:03 PM

## 2017-04-08 NOTE — Telephone Encounter (Signed)
Pt was on TCM report admitted 04/04/17 for RECURRENT INCISIONAL HERNIA. Pt underwent laparoscopic repair of her recurrent incisional hernia. Pt D/C 04/08/17 and will f/u w/specialist Dr. Dalbert Batman in 2 weeks.Marland KitchenJohny Chess

## 2017-04-09 ENCOUNTER — Ambulatory Visit: Payer: Medicare HMO | Admitting: Internal Medicine

## 2017-04-10 DIAGNOSIS — J449 Chronic obstructive pulmonary disease, unspecified: Secondary | ICD-10-CM | POA: Diagnosis not present

## 2017-04-10 DIAGNOSIS — J9611 Chronic respiratory failure with hypoxia: Secondary | ICD-10-CM | POA: Diagnosis not present

## 2017-04-10 DIAGNOSIS — J441 Chronic obstructive pulmonary disease with (acute) exacerbation: Secondary | ICD-10-CM | POA: Diagnosis not present

## 2017-04-11 NOTE — Telephone Encounter (Signed)
Attempted to call Patient and got voice mail. Left message for her to return call.

## 2017-04-11 NOTE — Procedures (Signed)
Patient Name: Joan [name], Joan [name] Date: 03/23/2017 Gender: Female D.O.B: 05-Nov-1960 Age (years): 51 Referring Provider: Kara Mead MD, ABSM Height (inches): 67 Interpreting Physician: Kara Mead MD, ABSM Weight (lbs): 220 RPSGT: Baxter Flattery BMI: 34 MRN: [id number] Neck Size: 15.50 <br> <br>   CLINICAL INFORMATION Sleep Study Type: NPSG  Indication for sleep study: Congestive Heart Failure, COPD, Fatigue, Obesity, OSA, Snoring, Witnessed Apneas  Epworth Sleepiness Score: 13    SLEEP STUDY TECHNIQUE As per the AASM Manual for the Scoring of Sleep and Associated Events v2.3 (April 2016) with a hypopnea requiring 4% desaturations.  The channels recorded and monitored were frontal, central and occipital EEG, electrooculogram (EOG), submentalis EMG (chin), nasal and oral airflow, thoracic and abdominal wall motion, anterior tibialis EMG, snore microphone, electrocardiogram, and pulse oximetry.  MEDICATIONS Medications self-administered by patient taken the night of the study : BACLOFEN, NAPROXEN, PREGABALIN, ROFLUMILAST, BENADRYL, VALTREX  SLEEP ARCHITECTURE The study was initiated at 10:19:20 PM and ended at 4:57:32 AM.  Sleep onset time was 23.6 minutes and the sleep efficiency was 59.4%. The total sleep time was 236.5 minutes.  Stage REM latency was 327.0 minutes.  The patient spent 5.71% of the night in stage N1 sleep, 78.86% in stage N2 sleep, 0.00% in stage N3 and 15.43% in REM.  Alpha intrusion was absent.  Supine sleep was 36.36%.  RESPIRATORY PARAMETERS The overall apnea/hypopnea index (AHI) was 3.0 per hour. There were 11 total apneas, including 10 obstructive, 1 central and 0 mixed apneas. There were 1 hypopneas and 0 RERAs.  The AHI during Stage REM sleep was 1.6 per hour.  AHI while supine was 2.8 per hour.  The mean oxygen saturation was 92.93%. The minimum SpO2 during sleep was 88.00%.  loud snoring was noted during this study.  CARDIAC DATA The 2  lead EKG demonstrated sinus rhythm. The mean heart rate was 62.11 beats per minute. Other EKG findings include: None. LEG MOVEMENT DATA The total PLMS were 0 with a resulting PLMS index of 0.00. Associated arousal with leg movement index was 0.0 .  IMPRESSIONS - No significant obstructive sleep apnea occurred during this study (AHI = 3.0/h). - No significant central sleep apnea occurred during this study (CAI = 0.3/h). - Moderate oxygen desaturation was noted during this study (Min O2 = 88.00%). - The patient snored with loud snoring volume. - No cardiac abnormalities were noted during this study. - Clinically significant periodic limb movements did not occur during sleep. No significant associated arousals.   DIAGNOSIS - No evidence of sleep disordered breathing   RECOMMENDATIONS - Avoid alcohol, sedatives and other CNS depressants that may worsen sleep apnea and disrupt normal sleep architecture. - Sleep hygiene should be reviewed to assess factors that may improve sleep quality. - Weight management and regular exercise should be initiated or continued if appropriate.   Kara Mead MD Board Certified in [place]

## 2017-04-14 NOTE — Telephone Encounter (Signed)
Pt is calling back [phone] 

## 2017-04-14 NOTE — Telephone Encounter (Signed)
RA please advise patient does not want to do the test again but she does not want to get rid of the machine.

## 2017-04-15 NOTE — Telephone Encounter (Signed)
Home sleep study would be easy to perform and low cost If further discussion required, please arrange office visit with NP

## 2017-04-17 NOTE — Telephone Encounter (Signed)
Called pt and advised message from the provider. Pt understood and verbalized understanding. Nothing further is needed.   HST order placed.

## 2017-04-21 ENCOUNTER — Encounter: Payer: Self-pay | Admitting: Pulmonary Disease

## 2017-04-21 ENCOUNTER — Telehealth: Payer: Self-pay | Admitting: Internal Medicine

## 2017-04-21 NOTE — Telephone Encounter (Signed)
Called pt and stated to her we have not seen her since 12/2016 and per RA, pt was supposed to have come in 6 weeks from that visit.  Scheduled an OV for pt with SG Wednesday, 04/23/17 and stated to her at that visit we could refill her meds then.  Pt expressed understanding. Nothing further needed at this current time.

## 2017-04-23 ENCOUNTER — Encounter: Payer: Self-pay | Admitting: Acute Care

## 2017-04-23 ENCOUNTER — Ambulatory Visit: Payer: Medicare HMO | Admitting: Acute Care

## 2017-04-23 DIAGNOSIS — J449 Chronic obstructive pulmonary disease, unspecified: Secondary | ICD-10-CM | POA: Diagnosis not present

## 2017-04-23 DIAGNOSIS — E119 Type 2 diabetes mellitus without complications: Secondary | ICD-10-CM | POA: Diagnosis not present

## 2017-04-23 DIAGNOSIS — M159 Polyosteoarthritis, unspecified: Secondary | ICD-10-CM | POA: Diagnosis not present

## 2017-04-23 DIAGNOSIS — J439 Emphysema, unspecified: Secondary | ICD-10-CM | POA: Diagnosis not present

## 2017-04-23 DIAGNOSIS — R69 Illness, unspecified: Secondary | ICD-10-CM | POA: Diagnosis not present

## 2017-04-23 DIAGNOSIS — D509 Iron deficiency anemia, unspecified: Secondary | ICD-10-CM | POA: Diagnosis not present

## 2017-04-23 DIAGNOSIS — K219 Gastro-esophageal reflux disease without esophagitis: Secondary | ICD-10-CM | POA: Diagnosis not present

## 2017-04-23 DIAGNOSIS — Z48815 Encounter for surgical aftercare following surgery on the digestive system: Secondary | ICD-10-CM | POA: Diagnosis not present

## 2017-04-23 DIAGNOSIS — I10 Essential (primary) hypertension: Secondary | ICD-10-CM | POA: Diagnosis not present

## 2017-04-23 DIAGNOSIS — G4733 Obstructive sleep apnea (adult) (pediatric): Secondary | ICD-10-CM | POA: Diagnosis not present

## 2017-04-23 DIAGNOSIS — J9611 Chronic respiratory failure with hypoxia: Secondary | ICD-10-CM | POA: Diagnosis not present

## 2017-04-23 MED ORDER — TIOTROPIUM BROMIDE MONOHYDRATE 2.5 MCG/ACT IN AERS: 2.0000 | INHALATION_SPRAY | Freq: Once | RESPIRATORY_TRACT | 3 refills | Status: DC

## 2017-04-23 MED ORDER — ALBUTEROL SULFATE HFA 108 (90 BASE) MCG/ACT IN AERS: 2.0000 | INHALATION_SPRAY | Freq: Four times a day (QID) | RESPIRATORY_TRACT | 1 refills | Status: DC | PRN

## 2017-04-23 MED ORDER — ROFLUMILAST 500 MCG PO TABS: 500.0000 ug | ORAL_TABLET | Freq: Every day | ORAL | 3 refills | Status: DC

## 2017-04-23 MED ORDER — MONTELUKAST SODIUM 10 MG PO TABS: 10.0000 mg | ORAL_TABLET | Freq: Every day | ORAL | 3 refills | Status: DC

## 2017-04-23 MED ORDER — IPRATROPIUM-ALBUTEROL 0.5-2.5 (3) MG/3ML IN SOLN: 3.0000 mL | Freq: Four times a day (QID) | RESPIRATORY_TRACT | 1 refills | Status: DC | PRN

## 2017-04-23 NOTE — Assessment & Plan Note (Signed)
Doing well on Symbicort Nausea with Incruse ( Changed from Spiriva 2/2 insurance) Wants her Spiriva back Plan: We will initiate  a prior auth of the Spiriva. We will give you a sample of Spiriva. We will have your prescription sent through [facility] home delivery. [phone]) Continue Symbicort twice daily. Rinse mouth after use. We will renew your Daliresp on tablet daily. Continue Singulair Continue albuterol neds as needed  Continue rescue inhaler as you have been doing. ( Send scrip for all lung meds to [facility])  We will arrange for a home sleep study  Continue oxygen 2 L [place] at night Oxygen 2 L [place] prn shortness of breath

## 2017-04-23 NOTE — Assessment & Plan Note (Signed)
Split night study indicated no evidence of sleep disorder breathing Weight loss of 65 pounds since last sleep study Plan: We will repeat a home sleep study to see if you qualify for the BiPap. Please bring in Sim Card so we can get a down Load. Follow up in 4 months  with Dr. Chase Caller or Judson Roch NP  Please contact office for sooner follow up if symptoms do not improve or worsen or seek emergency care

## 2017-04-23 NOTE — Patient Instructions (Addendum)
We will initiate  a prior auth of the Spiriva. We will give you a sample of Spiriva. We will have your prescription sent through [facility] home delivery. [phone]) Continue Symbicort twice daily. Rinse mouth after use. We will renew your Daliresp on tablet daily. Continue Singulair Continue albuterol neds as needed  Continue rescue inhaler as you have been doing. ( [facility] lung meds to [facility])  We will arrange for a home sleep study  Continue oxygen 2 L [place] at night Oxygen 2 L [place] prn shortness of breath We will repeat a home sleep study to see if you qualify for the BiPap. Please bring in Sim Card so we can get a down Load. Follow up in 4 months  with Dr. Chase Caller or Judson Roch NP  Please contact office for sooner follow up if symptoms do not improve or worsen or seek emergency care

## 2017-04-23 NOTE — Progress Notes (Signed)
History of Present Illness Joan [name] is a 57 y.o. female  former smoker with Stage 3 severe COPD and asthma.She has been a patient of Joan [name] since 07/2016. She is followed by Joan [name] for OSA and BiPAP therapy.    04/23/2017 Follow up to discuss  results of split night sleep study. Per split night study done 03/25/2017, patient does not need. CPAP. Pt. States she does not want to repeat the test, but she also does not want to get rid of the machine. She is here to discuss her options:Additionally she is complaining that the Incruse she was switched to for insurance purposes makes her sick.She states the taste makes her nauseated. She gets so sick she has to sleep for an hour or two until the nausea passes. She feels light headed in addition to the nausea. She has been unable to take the medication.She has been using her Symbicort alone. She would like to have her Spiriva back, as Spiriva did not make her sick. Pt. Wants to keep her BiPap machine. We will repeat a home sleep study to re-evaluate her sleep diagnosis.She wants a new machine, but Sleep studies need to confirm need before one can be ordered. We discussed that her weigh loss is most likely the reason for the change in sleep test results.. She has lost about 65 pounds since the last sleep study was done. She denies any fever, chest pain, orthopnea or hemoptysis.  Test Results:  IMPRESSIONS - No significant obstructive sleep apnea occurred during this study (AHI = 3.0/h). - No significant central sleep apnea occurred during this study (CAI = 0.3/h). - Moderate oxygen desaturation was noted during this study (Min O2 = 88.00%). - The patient snored with loud snoring volume. - No cardiac abnormalities were noted during this study. - Clinically significant periodic limb movements did not occur during sleep. No significant associated arousals.   DIAGNOSIS - No evidence of sleep disordered breathing   RECOMMENDATIONS - Avoid  alcohol, sedatives and other CNS depressants that may worsen sleep apnea and disrupt normal sleep architecture. - Sleep hygiene should be reviewed to assess factors that may improve sleep quality. - Weight management and regular exercise should be initiated or continued if appropriate.  10/2016 PFTs showed ratio 49, FEV1 of 36%, FVC 58% and DLCO of 34% consistent with severe obstruction.  09/2016 echo was normal.  ONO 11/2016 on CPAP/room air showed desaturation for 1 hour 45 minutes started on nocturnal oxygen>>  CBC Latest Ref Rng & Units 04/07/2017 04/04/2017 02/11/2017  WBC 4.0 - 10.5 K/uL 9.0 10.3 8.1  Hemoglobin 12.0 - 15.0 g/dL 10.7(L) 12.4 9.5(A)  Hematocrit 36.0 - 46.0 % 33.5(L) 38.3 28(A)  Platelets 150 - 400 K/uL 209 254 271    BMP Latest Ref Rng & Units 04/07/2017 04/04/2017 02/07/2017  Glucose 65 - 99 mg/dL 150(H) 126(H) 171(H)  BUN 6 - 20 mg/dL 5(L) 15 8  Creatinine 0.44 - 1.00 mg/dL 0.90 1.13(H) 0.75  Sodium 135 - 145 mmol/L 141 139 134(L)  Potassium 3.5 - 5.1 mmol/L 3.4(L) 3.8 4.0  Chloride 101 - 111 mmol/L 106 105 98(L)  CO2 22 - 32 mmol/L 25 19(L) 26  Calcium 8.9 - 10.3 mg/dL 8.5(L) 8.9 8.5(L)    BNP No results found for: BNP  ProBNP No results found for: PROBNP  PFT    Component Value Date/Time   FEV1PRE 1.05 10/30/2016 0847   FEV1POST 1.23 10/30/2016 0847   FVCPRE 2.16 10/30/2016 0847   FVCPOST  2.53 10/30/2016 0847   TLC 5.36 10/30/2016 0847   DLCOUNC 9.40 10/30/2016 0847   PREFEV1FVCRT 49 10/30/2016 0847   PSTFEV1FVCRT 49 10/30/2016 0847    No results found.   Past medical hx Past Medical History:  Diagnosis Date  . Alcoholism and drug addiction in family   . Anxiety   . Arthritis    "knees, hips, lower back" (09/25/2016)  . Asthma   . Bipolar disorder ([place])   . Chronic bronchitis ([place])    "all the time" (09/25/2016)  . Chronic leg pain    RLE  . Chronic lower back pain   . COPD (chronic obstructive pulmonary disease) ([place])   . Depression     . Emphysema of lung ([place])   . GERD (gastroesophageal reflux disease)   . Headache   . Hepatitis C    "treated back in 2001-2002"  . High cholesterol   . Hypertension   . Hypothyroidism   . Incarcerated ventral hernia 04/04/2017  . On home oxygen therapy    "2L; at nighttime" (04/04/2017)  . OSA on CPAP   . Pneumonia    "all the time" (09/25/2016)  . Positive PPD    with negative chest x ray  . Thyroid disease   . Type II diabetes mellitus (HCC)      Social History   Tobacco Use  . Smoking status: Former Smoker    Packs/day: 1.00    Years: 44.00    Pack years: 44.00    Types: Cigarettes    Last attempt to quit: 11/22/2016    Years since quitting: 0.4  . Smokeless tobacco: Never Used  . Tobacco comment: pt quit smoking 11/22/16 and currently taking chantix  (04/04/2017)  Substance Use Topics  . Alcohol use: Yes    Comment: 04/04/2017 "quit years ago"  . Drug use: Yes    Types: "Crack" cocaine    Comment: 04/04/2017 "nothing since 01/12/2012"    Joan [name] reports that she quit smoking about 5 months ago. Her smoking use included cigarettes. She has a 44.00 pack-year smoking history. she has never used smokeless tobacco. She reports that she drinks alcohol. She reports that she uses drugs. Drug: "Crack" cocaine.  Tobacco Cessation: Former smoker with a 44 pack year smoking history. She quit 10/2016. She has a hostory of crack cocaine use   Past surgical hx, Family hx, Social hx all reviewed.  Current Outpatient Medications on File Prior to Visit  Medication Sig  . aspirin EC 325 MG tablet Take 1 tablet (325 mg total) by mouth 2 (two) times daily.  Marland Kitchen atenolol (TENORMIN) 25 MG tablet Take 1 tablet (25 mg total) by mouth daily.  Marland Kitchen atorvastatin (LIPITOR) 20 MG tablet Take 20 mg by mouth daily.  . baclofen (LIORESAL) 10 MG tablet Take 10 mg by mouth at bedtime.   . budesonide-formoterol (SYMBICORT) 160-4.5 MCG/ACT inhaler Inhale 2 puffs into the lungs 2 (two) times daily.  . CHANTIX  CONTINUING MONTH PAK 1 MG tablet TAKE 1 TABLET BY MOUTH TWICE A DAY  . diphenhydrAMINE (BENADRYL) 25 mg capsule Take 25 mg by mouth at bedtime.  Marland Kitchen levothyroxine (SYNTHROID, LEVOTHROID) 25 MCG tablet Take 1 tablet (25 mcg total) by mouth daily before breakfast.  . methocarbamol (ROBAXIN) 500 MG tablet Take 500 mg by mouth every 6 (six) hours as needed for muscle spasms.  . naproxen (NAPROSYN) 500 MG tablet Take 1 tablet (500 mg total) by mouth 2 (two) times daily with a meal.  . omeprazole (  PRILOSEC) 20 MG capsule Take 20 mg by mouth daily before breakfast.  . pregabalin (LYRICA) 75 MG capsule Take 1 capsule (75 mg total) by mouth 2 (two) times daily.  . traMADol (ULTRAM) 50 MG tablet Take 1 tablet (50 mg total) by mouth every 6 (six) hours as needed for moderate pain.  Marland Kitchen umeclidinium bromide (INCRUSE ELLIPTA) 62.5 MCG/INH AEPB Inhale 1 puff into the lungs daily.  . valACYclovir (VALTREX) 500 MG tablet Take 500 mg by mouth daily.   Marland Kitchen venlafaxine XR (EFFEXOR-XR) 150 MG 24 hr capsule Take 1 capsule (150 mg total) by mouth daily with breakfast.   No current facility-administered medications on file prior to visit.      Allergies  Allergen Reactions  . Dilaudid [Hydromorphone Hcl] Other (See Comments)    "heavy sedation"  . Keflex [Cephalexin] Other (See Comments)    unspecified  . Prednisone Other (See Comments)    "counter reacts"  . Shellfish Allergy Other (See Comments)    Stomach cramps  . Tetracyclines & Related Other (See Comments)    unspecified    Review Of Systems:  Constitutional:   No  weight loss, night sweats,  Fevers, chills, fatigue, or  lassitude.  HEENT:   No headaches,  Difficulty swallowing,  Tooth/dental problems, or  Sore throat,                No sneezing, itching, ear ache, nasal congestion, post nasal drip,   CV:  No chest pain,  Orthopnea, PND, swelling in lower extremities, anasarca, dizziness, palpitations, syncope.   GI  No heartburn, indigestion,  abdominal pain, nausea, vomiting, diarrhea, change in bowel habits, loss of appetite, bloody stools.   Resp: No shortness of breath with exertion or at rest.  No excess mucus, no productive cough,  No non-productive cough,  No coughing up of blood.  No change in color of mucus.  No wheezing.  No chest wall deformity  Skin: no rash or lesions.  GU: no dysuria, change in color of urine, no urgency or frequency.  No flank pain, no hematuria   MS:  No joint pain or swelling.  No decreased range of motion.  No back pain.  Psych:  No change in mood or affect. No depression or anxiety.  No memory loss.   Vital Signs BP (!) 152/96 (BP Location: Left Arm, Cuff Size: Normal)   Pulse 80   Ht 5\' 7"  (1.702 m)   Wt 214 lb (97.1 kg)   SpO2 96%   BMI 33.52 kg/m    Physical Exam:  General- No distress,  A&Ox3, pleasant ENT: No sinus tenderness, TM clear, pale nasal mucosa, no oral exudate,no post nasal drip, no LAN Cardiac: S1, S2, regular rate and rhythm, no murmur Chest: No wheeze/ rales/ dullness; no accessory muscle use, no nasal flaring, no sternal retractions Abd.: Soft Non-tender, non-distended, obese Ext: No clubbing cyanosis, edema Neuro:  normal strength, MAE x 4, A&O x 3 Skin: No rashes, warm and dry Psych: normal mood and behavior   Assessment/Plan  COPD, severe (HCC) Doing well on Symbicort Nausea with Incruse ( Changed from Spiriva 2/2 insurance) Wants her Spiriva back Plan: We will initiate  a prior auth of the Spiriva. We will give you a sample of Spiriva. We will have your prescription sent through [facility] home delivery. [phone]) Continue Symbicort twice daily. Rinse mouth after use. We will renew your Daliresp on tablet daily. Continue Singulair Continue albuterol neds as needed  Continue  rescue inhaler as you have been doing. ( Send scrip for all lung meds to [facility])  We will arrange for a home sleep study  Continue oxygen 2 L [place] at night Oxygen 2 L  [place] prn shortness of breath   OSA treated with BiPAP Split night study indicated no evidence of sleep disorder breathing Weight loss of 65 pounds since last sleep study Plan: We will repeat a home sleep study to see if you qualify for the BiPap. Please bring in Sim Card so we can get a down Load. Follow up in 4 months  with Joan [name] or Judson Roch NP  Please contact office for sooner follow up if symptoms do not improve or worsen or seek emergency care         [name], NP 04/23/2017  8:41 PM

## 2017-04-25 DIAGNOSIS — R69 Illness, unspecified: Secondary | ICD-10-CM | POA: Diagnosis not present

## 2017-04-29 DIAGNOSIS — Z48815 Encounter for surgical aftercare following surgery on the digestive system: Secondary | ICD-10-CM | POA: Diagnosis not present

## 2017-04-29 DIAGNOSIS — E119 Type 2 diabetes mellitus without complications: Secondary | ICD-10-CM | POA: Diagnosis not present

## 2017-04-29 DIAGNOSIS — J439 Emphysema, unspecified: Secondary | ICD-10-CM | POA: Diagnosis not present

## 2017-04-29 DIAGNOSIS — K219 Gastro-esophageal reflux disease without esophagitis: Secondary | ICD-10-CM | POA: Diagnosis not present

## 2017-04-29 DIAGNOSIS — R69 Illness, unspecified: Secondary | ICD-10-CM | POA: Diagnosis not present

## 2017-04-29 DIAGNOSIS — M159 Polyosteoarthritis, unspecified: Secondary | ICD-10-CM | POA: Diagnosis not present

## 2017-04-29 DIAGNOSIS — D509 Iron deficiency anemia, unspecified: Secondary | ICD-10-CM | POA: Diagnosis not present

## 2017-04-29 DIAGNOSIS — J9611 Chronic respiratory failure with hypoxia: Secondary | ICD-10-CM | POA: Diagnosis not present

## 2017-04-29 DIAGNOSIS — I10 Essential (primary) hypertension: Secondary | ICD-10-CM | POA: Diagnosis not present

## 2017-05-05 DIAGNOSIS — G4733 Obstructive sleep apnea (adult) (pediatric): Secondary | ICD-10-CM | POA: Diagnosis not present

## 2017-05-06 ENCOUNTER — Telehealth: Payer: Self-pay | Admitting: Internal Medicine

## 2017-05-06 DIAGNOSIS — J9611 Chronic respiratory failure with hypoxia: Secondary | ICD-10-CM | POA: Diagnosis not present

## 2017-05-06 DIAGNOSIS — G4733 Obstructive sleep apnea (adult) (pediatric): Secondary | ICD-10-CM

## 2017-05-06 DIAGNOSIS — J439 Emphysema, unspecified: Secondary | ICD-10-CM | POA: Diagnosis not present

## 2017-05-06 DIAGNOSIS — I10 Essential (primary) hypertension: Secondary | ICD-10-CM | POA: Diagnosis not present

## 2017-05-06 DIAGNOSIS — R69 Illness, unspecified: Secondary | ICD-10-CM | POA: Diagnosis not present

## 2017-05-06 DIAGNOSIS — D509 Iron deficiency anemia, unspecified: Secondary | ICD-10-CM | POA: Diagnosis not present

## 2017-05-06 DIAGNOSIS — K219 Gastro-esophageal reflux disease without esophagitis: Secondary | ICD-10-CM | POA: Diagnosis not present

## 2017-05-06 DIAGNOSIS — E119 Type 2 diabetes mellitus without complications: Secondary | ICD-10-CM | POA: Diagnosis not present

## 2017-05-06 DIAGNOSIS — Z48815 Encounter for surgical aftercare following surgery on the digestive system: Secondary | ICD-10-CM | POA: Diagnosis not present

## 2017-05-06 DIAGNOSIS — M159 Polyosteoarthritis, unspecified: Secondary | ICD-10-CM | POA: Diagnosis not present

## 2017-05-06 NOTE — Telephone Encounter (Signed)
Download was completed. Report has been placed in Dr. Golden Pop look at.  MR - please advise. Thanks.

## 2017-05-06 NOTE — Telephone Encounter (Signed)
The patient called me this morning and ask that I put the CPAP download with the HST report for Dr. Elsworth Soho to review. I have taken it from MR's look at stack and put with the HST report on Dr. Bari Mantis desk to read

## 2017-05-06 NOTE — Telephone Encounter (Signed)
Can continue to monitor, if persistently high call back

## 2017-05-06 NOTE — Telephone Encounter (Signed)
Copied from [place]. Topic: Inquiry >> May 06, 2017  1:09 PM Oliver Pila B wrote: Reason for CRM: [facility] home health called to report pt's bp is 156/100; contact [phone] if needed

## 2017-05-06 NOTE — Telephone Encounter (Signed)
FYI

## 2017-05-06 NOTE — Telephone Encounter (Signed)
Contacted [facility] and gave MD response. Stated understanding

## 2017-05-07 ENCOUNTER — Other Ambulatory Visit: Payer: Self-pay | Admitting: *Deleted

## 2017-05-07 DIAGNOSIS — G4733 Obstructive sleep apnea (adult) (pediatric): Secondary | ICD-10-CM

## 2017-05-08 ENCOUNTER — Telehealth: Payer: Self-pay | Admitting: Pulmonary Disease

## 2017-05-08 DIAGNOSIS — J449 Chronic obstructive pulmonary disease, unspecified: Secondary | ICD-10-CM | POA: Diagnosis not present

## 2017-05-08 DIAGNOSIS — J441 Chronic obstructive pulmonary disease with (acute) exacerbation: Secondary | ICD-10-CM | POA: Diagnosis not present

## 2017-05-08 DIAGNOSIS — J9611 Chronic respiratory failure with hypoxia: Secondary | ICD-10-CM | POA: Diagnosis not present

## 2017-05-08 MED ORDER — ROFLUMILAST 500 MCG PO TABS: 500.0000 ug | ORAL_TABLET | Freq: Every day | ORAL | 1 refills | Status: DC

## 2017-05-08 MED ORDER — BUDESONIDE-FORMOTEROL FUMARATE 160-4.5 MCG/ACT IN AERO: 2.0000 | INHALATION_SPRAY | Freq: Two times a day (BID) | RESPIRATORY_TRACT | 1 refills | Status: DC

## 2017-05-08 NOTE — Telephone Encounter (Signed)
Spoke with pt. She is needing refills on Symbicort and Daliresp. Rxs have been sent in. Nothing further was needed.

## 2017-05-09 ENCOUNTER — Telehealth: Payer: Self-pay

## 2017-05-09 DIAGNOSIS — G4733 Obstructive sleep apnea (adult) (pediatric): Secondary | ICD-10-CM | POA: Diagnosis not present

## 2017-05-09 NOTE — Telephone Encounter (Signed)
Her sleep study showed mild to moderate OSA, AHI 16/hour I have reviewed download from her BiPAP machine this is set at 8/4 with residual AHI 6/hour and average usage about 4 hours but 5 hours on days used  This data is not enough to justify a BiPAP machine at this time, not clear why she was placed on a BiPAP machine up [place].  If she wants a new machine, Would suggest auto CPAP 5-12 cm with EPR 3  and I feel she will have the same if not better results

## 2017-05-09 NOTE — Telephone Encounter (Signed)
She has already failed incruse. Please prescribe atrovent . Call her and let her know. Have her take this with her Symbicort. Atrovent Dose is 2 puffs 4 times a day.She cannot tolerate powders. Thanks

## 2017-05-09 NOTE — Telephone Encounter (Signed)
SG- [place] will not cover Spiriva. She has to try and fail Incruse, Atrovent, Advair, breo, Flovent diskus, Symbicort, Trelegy. Please advise.

## 2017-05-12 NOTE — Telephone Encounter (Signed)
Spoke with patient for over 30 minutes. She still states that she is confused as to how her doctor up [place] diagnosed her with central sleep apnea and was told that this was the "most dangerous type" and she could die without using her machine.   Advised patient that both sleep studies from this year show that she has mild OSA and would possibly benefit from a CPAP, not BIPAP.   Also advised patient that RA is willing to order a CPAP for her just to ease her mind. Patient is already getting O2 from [facility] per her chart, but she states this is coming from [facility]. Spoke with a rep with [facility], they do not have her as a patient.   Will go ahead and place order for CPAP. Patient is aware. Nothing else needed at time of call.

## 2017-05-14 NOTE — Telephone Encounter (Signed)
ATC pt, no answer. Left message for pt to call back.  

## 2017-05-15 ENCOUNTER — Ambulatory Visit: Payer: Medicare HMO | Admitting: Acute Care

## 2017-05-15 ENCOUNTER — Encounter: Payer: Self-pay | Admitting: Acute Care

## 2017-05-15 DIAGNOSIS — R69 Illness, unspecified: Secondary | ICD-10-CM | POA: Diagnosis not present

## 2017-05-15 DIAGNOSIS — J449 Chronic obstructive pulmonary disease, unspecified: Secondary | ICD-10-CM | POA: Diagnosis not present

## 2017-05-15 MED ORDER — IPRATROPIUM BROMIDE HFA 17 MCG/ACT IN AERS: 2.0000 | INHALATION_SPRAY | Freq: Four times a day (QID) | RESPIRATORY_TRACT | 1 refills | Status: DC

## 2017-05-15 NOTE — Assessment & Plan Note (Signed)
Stable interval Plan: Continue Symbicort and Spiriva Rinse mouth after use Continue Daliresp Continue Singulair Albuterol nebs as needed Continue oxygen at 2 L [place] at night, and as needed for shortness of breath Saturation goals are 88-92%

## 2017-05-15 NOTE — Telephone Encounter (Signed)
Spoke with pt in the office today to advise message from [place]. I will send RX to to [facility].

## 2017-05-15 NOTE — Progress Notes (Signed)
History of Present Illness Joan [name] is a 57 y.o. female former smoker ( Quit 2018)  with Stage 3 severe COPD, and asthma.She has been a patient of Dr. Chase [name] since 07/2016. She is followed by Dr. Elsworth [name] for OSA and BiPAP therapy.  05/15/2017 Surgical Clearance Visit Pt. Presents for appointment for surgical clearance. She states she had her hip done 01/2017. She did well from a respiratory perspective. She had her hernia repaired 03/2017 and she did well from a respiratory perspective..She is here for surgical clearance for her second hip replacement on the left. She is compliant with her medications. Her maintenance medication is Spiriva. She is currently in the process of getting this authorized by her Insurance. She is also waiting on her Daliresp. All paperwork has been completed on our end.She has not had a flare was 07/2016. She is currently well controlled.  [place] Postperative Pulmonary Risk Score Comment Score  Type of surgery - abd ao aneurysm (27), thoracic (21), neurosurgery / upper abdominal / vascular (21), neck (11)    Emergency Surgery - (11)  0  ALbumin < 3 or poor nutritional state - (9)  ?  BUN > 30 -  (8)  8  Partial or completely dependent functional status - (7)    COPD -  (6)  6  Age - 60 to 21 (4), > 70  (6)    TOTAL    Risk Stratifcation scores  - < 10, 11-19, 20-27, 28-40, >40  14  Moderate Risk for Pulmonary complications with surgery. Please follow the recommendations below.  Risk ameliorating factors are COPD, Asthma,need for home oxygen with exertion.   Major Pulmonary risks identified in the multifactorial risk analysis are but not limited to a) pneumonia; b) recurrent intubation risk; c) prolonged or recurrent acute respiratory failure needing mechanical ventilation; d) prolonged hospitalization; e) DVT/Pulmonary embolism; f) Acute Pulmonary edema  Recommend>> Clear for surgery 1. Short duration of surgery as much as possible and avoid  paralytic if possible 2. Recovery in step down or ICU with Pulmonary consultation 3. DVT prophylaxis 4. Aggressive pulmonary toilet with o2, bronchodilatation, and incentive spirometry and early ambulation 5. Pt must wear CPAP QHS while in hospital 6. Oxygen saturation goals are 88-92% ( COPD) 7. Hospital Follow up [place] with Pulmonary.   Test Results:  CBC Latest Ref Rng & Units 04/07/2017 04/04/2017 02/11/2017  WBC 4.0 - 10.5 K/uL 9.0 10.3 8.1  Hemoglobin 12.0 - 15.0 g/dL 10.7(L) 12.4 9.5(A)  Hematocrit 36.0 - 46.0 % 33.5(L) 38.3 28(A)  Platelets 150 - 400 K/uL 209 254 271    BMP Latest Ref Rng & Units 04/07/2017 04/04/2017 02/07/2017  Glucose 65 - 99 mg/dL 150(H) 126(H) 171(H)  BUN 6 - 20 mg/dL 5(L) 15 8  Creatinine 0.44 - 1.00 mg/dL 0.90 1.13(H) 0.75  Sodium 135 - 145 mmol/L 141 139 134(L)  Potassium 3.5 - 5.1 mmol/L 3.4(L) 3.8 4.0  Chloride 101 - 111 mmol/L 106 105 98(L)  CO2 22 - 32 mmol/L 25 19(L) 26  Calcium 8.9 - 10.3 mg/dL 8.5(L) 8.9 8.5(L)    BNP No results found for: BNP  ProBNP No results found for: PROBNP  PFT    Component Value Date/Time   FEV1PRE 1.05 10/30/2016 0847   FEV1POST 1.23 10/30/2016 0847   FVCPRE 2.16 10/30/2016 0847   FVCPOST 2.53 10/30/2016 0847   TLC 5.36 10/30/2016 0847   DLCOUNC 9.40 10/30/2016 0847   PREFEV1FVCRT 49 10/30/2016 0847   PSTFEV1FVCRT 49 10/30/2016  0847    No results found.   Past medical hx Past Medical History:  Diagnosis Date  . Alcoholism and drug addiction in family   . Anxiety   . Arthritis    "knees, hips, lower back" (09/25/2016)  . Asthma   . Bipolar disorder ([place])   . Chronic bronchitis ([place])    "all the time" (09/25/2016)  . Chronic leg pain    RLE  . Chronic lower back pain   . COPD (chronic obstructive pulmonary disease) ([place])   . Depression   . Emphysema of lung ([place])   . GERD (gastroesophageal reflux disease)   . Headache   . Hepatitis C    "treated back in 2001-2002"  . High cholesterol   .  Hypertension   . Hypothyroidism   . Incarcerated ventral hernia 04/04/2017  . On home oxygen therapy    "2L; at nighttime" (04/04/2017)  . OSA on CPAP   . Pneumonia    "all the time" (09/25/2016)  . Positive PPD    with negative chest x ray  . Thyroid disease   . Type II diabetes mellitus (HCC)      Social History   Tobacco Use  . Smoking status: Former Smoker    Packs/day: 1.00    Years: 44.00    Pack years: 44.00    Types: Cigarettes    Last attempt to quit: 11/22/2016    Years since quitting: 0.4  . Smokeless tobacco: Never Used  . Tobacco comment: pt quit smoking 11/22/16 and currently taking chantix  (04/04/2017)  Substance Use Topics  . Alcohol use: Yes    Comment: 04/04/2017 "quit years ago"  . Drug use: Yes    Types: "Crack" cocaine    Comment: 04/04/2017 "nothing since 01/12/2012"    Joan [name] reports that she quit smoking about 5 months ago. Her smoking use included cigarettes. She has a 44.00 pack-year smoking history. She has never used smokeless tobacco. She reports that she drinks alcohol. She reports that she has current or past drug history. Drug: "Crack" cocaine.  Tobacco Cessation: Former smoker quit 2018 with a 44 pack year smoking history.  Past surgical hx, Family hx, Social hx all reviewed.  Current Outpatient Medications on File Prior to Visit  Medication Sig  . albuterol (PROVENTIL HFA;VENTOLIN HFA) 108 (90 Base) MCG/ACT inhaler Inhale 2 puffs into the lungs every 6 (six) hours as needed for wheezing or shortness of breath (before activity).  Marland Kitchen aspirin EC 325 MG tablet Take 1 tablet (325 mg total) by mouth 2 (two) times daily.  Marland Kitchen atenolol (TENORMIN) 25 MG tablet Take 1 tablet (25 mg total) by mouth daily.  Marland Kitchen atorvastatin (LIPITOR) 20 MG tablet Take 20 mg by mouth daily.  . baclofen (LIORESAL) 10 MG tablet Take 10 mg by mouth at bedtime.   . budesonide-formoterol (SYMBICORT) 160-4.5 MCG/ACT inhaler Inhale 2 puffs into the lungs 2 (two) times daily.  . CHANTIX  CONTINUING MONTH PAK 1 MG tablet TAKE 1 TABLET BY MOUTH TWICE A DAY  . diphenhydrAMINE (BENADRYL) 25 mg capsule Take 25 mg by mouth at bedtime.  Marland Kitchen ipratropium-albuterol (DUONEB) 0.5-2.5 (3) MG/3ML SOLN Take 3 mLs by nebulization every 6 (six) hours as needed (for wheezing/shortness of breath).  Marland Kitchen levothyroxine (SYNTHROID, LEVOTHROID) 25 MCG tablet Take 1 tablet (25 mcg total) by mouth daily before breakfast.  . methocarbamol (ROBAXIN) 500 MG tablet Take 500 mg by mouth every 6 (six) hours as needed for muscle spasms.  . montelukast (SINGULAIR)  10 MG tablet Take 1 tablet (10 mg total) by mouth at bedtime.  . naproxen (NAPROSYN) 500 MG tablet Take 1 tablet (500 mg total) by mouth 2 (two) times daily with a meal.  . omeprazole (PRILOSEC) 20 MG capsule Take 20 mg by mouth daily before breakfast.  . pregabalin (LYRICA) 75 MG capsule Take 1 capsule (75 mg total) by mouth 2 (two) times daily.  . roflumilast (DALIRESP) 500 MCG TABS tablet Take 1 tablet (500 mcg total) by mouth daily.  . traMADol (ULTRAM) 50 MG tablet Take 1 tablet (50 mg total) by mouth every 6 (six) hours as needed for moderate pain.  Marland Kitchen umeclidinium bromide (INCRUSE ELLIPTA) 62.5 MCG/INH AEPB Inhale 1 puff into the lungs daily.  . valACYclovir (VALTREX) 500 MG tablet Take 500 mg by mouth daily.   Marland Kitchen venlafaxine XR (EFFEXOR-XR) 150 MG 24 hr capsule Take 1 capsule (150 mg total) by mouth daily with breakfast.  . ipratropium (ATROVENT HFA) 17 MCG/ACT inhaler Inhale 2 puffs into the lungs 4 (four) times daily.  . Tiotropium Bromide Monohydrate (SPIRIVA RESPIMAT) 2.5 MCG/ACT AERS Inhale 2 puffs into the lungs once for 1 dose.   No current facility-administered medications on file prior to visit.      Allergies  Allergen Reactions  . Dilaudid [Hydromorphone Hcl] Other (See Comments)    "heavy sedation"  . Keflex [Cephalexin] Other (See Comments)    unspecified  . Prednisone Other (See Comments)    "counter reacts"  . Shellfish Allergy  Other (See Comments)    Stomach cramps  . Tetracyclines & Related Other (See Comments)    unspecified    Review Of Systems:  Constitutional:   No  weight loss, night sweats,  Fevers, chills, fatigue, or  lassitude.  HEENT:   No headaches,  Difficulty swallowing,  Tooth/dental problems, or  Sore throat,                No sneezing, itching, ear ache, nasal congestion, post nasal drip,   CV:  No chest pain,  Orthopnea, PND, swelling in lower extremities, anasarca, dizziness, palpitations, syncope.   GI  No heartburn, indigestion, abdominal pain, nausea, vomiting, diarrhea, change in bowel habits, loss of appetite, bloody stools.   Resp: Baseline  shortness of breath with exertion or at rest.  No excess mucus, no productive cough,  No non-productive cough,  No coughing up of blood.  No change in color of mucus.  No wheezing.  No chest wall deformity  Skin: no rash or lesions.  GU: no dysuria, change in color of urine, no urgency or frequency.  No flank pain, no hematuria   MS:  No joint pain or swelling.  No decreased range of motion.  No back pain.  Psych:  No change in mood or affect. No depression or anxiety.  No memory loss.   Vital Signs BP (!) 142/90 (BP Location: Right Arm, Cuff Size: Normal)   Pulse 78   Ht 5\' 8"  (1.727 m)   Wt 216 lb 3.2 oz (98.1 kg)   SpO2 93%   BMI 32.87 kg/m    Physical Exam:  General- No distress,  A&Ox3, pleasant  ENT: No sinus tenderness, TM clear, pale nasal mucosa, no oral exudate,no post nasal drip, no LAN Cardiac: S1, S2, regular rate and rhythm, no murmur Chest: No wheeze/ rales/ dullness; no accessory muscle use, no nasal flaring, no sternal retractions, diminished per bases Abd.: Soft Non-tender, non-distended, Obese Ext: No clubbing cyanosis, edema Neuro:  Deconditioned at baseline , MAE x 4, A&O x 3 Skin: No rashes, warm and dry Psych: normal mood and behavior   Assessment/Plan  COPD, severe (HCC) Stable  interval Plan: Continue Symbicort and Spiriva Rinse mouth after use Continue Daliresp Continue Singulair Albuterol nebs as needed Continue oxygen at 2 L [place] at night, and as needed for shortness of breath Saturation goals are 88-92%  Surgical Clearance We will clear you for your hip Surgery. I have made recommendations about your pre and post operative care. Make sure you do your maintenance medications and neb treatment before you go to the hospital the morning of surgery See above post op recommendations Follow up with [name] or Dr. Elsworth [name] after surgery to ensure you are doing well. Please contact office for sooner follow up if symptoms do not improve or worsen or seek emergency care    [name], NP 05/15/2017  4:24 PM

## 2017-05-15 NOTE — Patient Instructions (Addendum)
It is good to see you today. We will clear you for your hip Surgery. I have made recommendations about your pre and post operative care. Make sure you do your maintenance medications and neb treatment before you go to the hospital the morning of surgery Follow up with Joan [name] or Dr. Elsworth Soho after surgery to ensure you are doing well. Please contact office for sooner follow up if symptoms do not improve or worsen or seek emergency care

## 2017-05-20 ENCOUNTER — Telehealth: Payer: Self-pay | Admitting: Pulmonary Disease

## 2017-05-20 DIAGNOSIS — D509 Iron deficiency anemia, unspecified: Secondary | ICD-10-CM | POA: Diagnosis not present

## 2017-05-20 DIAGNOSIS — J9611 Chronic respiratory failure with hypoxia: Secondary | ICD-10-CM | POA: Diagnosis not present

## 2017-05-20 DIAGNOSIS — I10 Essential (primary) hypertension: Secondary | ICD-10-CM | POA: Diagnosis not present

## 2017-05-20 DIAGNOSIS — R69 Illness, unspecified: Secondary | ICD-10-CM | POA: Diagnosis not present

## 2017-05-20 DIAGNOSIS — K219 Gastro-esophageal reflux disease without esophagitis: Secondary | ICD-10-CM | POA: Diagnosis not present

## 2017-05-20 DIAGNOSIS — J439 Emphysema, unspecified: Secondary | ICD-10-CM | POA: Diagnosis not present

## 2017-05-20 DIAGNOSIS — M159 Polyosteoarthritis, unspecified: Secondary | ICD-10-CM | POA: Diagnosis not present

## 2017-05-20 DIAGNOSIS — E119 Type 2 diabetes mellitus without complications: Secondary | ICD-10-CM | POA: Diagnosis not present

## 2017-05-20 DIAGNOSIS — Z48815 Encounter for surgical aftercare following surgery on the digestive system: Secondary | ICD-10-CM | POA: Diagnosis not present

## 2017-05-20 MED ORDER — ROFLUMILAST 500 MCG PO TABS: 500.0000 ug | ORAL_TABLET | Freq: Every day | ORAL | 1 refills | Status: DC

## 2017-05-20 NOTE — Telephone Encounter (Signed)
Spoke with pt. She is needing a refill for Daliresp. Rx has been sent to [facility]. Nothing further was needed.

## 2017-05-21 DIAGNOSIS — J9611 Chronic respiratory failure with hypoxia: Secondary | ICD-10-CM | POA: Diagnosis not present

## 2017-05-21 DIAGNOSIS — G4733 Obstructive sleep apnea (adult) (pediatric): Secondary | ICD-10-CM | POA: Diagnosis not present

## 2017-05-21 DIAGNOSIS — J441 Chronic obstructive pulmonary disease with (acute) exacerbation: Secondary | ICD-10-CM | POA: Diagnosis not present

## 2017-05-21 DIAGNOSIS — J449 Chronic obstructive pulmonary disease, unspecified: Secondary | ICD-10-CM | POA: Diagnosis not present

## 2017-05-27 DIAGNOSIS — M159 Polyosteoarthritis, unspecified: Secondary | ICD-10-CM | POA: Diagnosis not present

## 2017-05-27 DIAGNOSIS — Z48815 Encounter for surgical aftercare following surgery on the digestive system: Secondary | ICD-10-CM | POA: Diagnosis not present

## 2017-05-27 DIAGNOSIS — I10 Essential (primary) hypertension: Secondary | ICD-10-CM | POA: Diagnosis not present

## 2017-05-27 DIAGNOSIS — K219 Gastro-esophageal reflux disease without esophagitis: Secondary | ICD-10-CM | POA: Diagnosis not present

## 2017-05-27 DIAGNOSIS — J439 Emphysema, unspecified: Secondary | ICD-10-CM | POA: Diagnosis not present

## 2017-05-27 DIAGNOSIS — D509 Iron deficiency anemia, unspecified: Secondary | ICD-10-CM | POA: Diagnosis not present

## 2017-05-27 DIAGNOSIS — J9611 Chronic respiratory failure with hypoxia: Secondary | ICD-10-CM | POA: Diagnosis not present

## 2017-05-27 DIAGNOSIS — R69 Illness, unspecified: Secondary | ICD-10-CM | POA: Diagnosis not present

## 2017-05-27 DIAGNOSIS — E119 Type 2 diabetes mellitus without complications: Secondary | ICD-10-CM | POA: Diagnosis not present

## 2017-06-02 ENCOUNTER — Encounter (HOSPITAL_COMMUNITY): Payer: Self-pay | Admitting: Emergency Medicine

## 2017-06-02 ENCOUNTER — Emergency Department (HOSPITAL_COMMUNITY)
Admission: EM | Admit: 2017-06-02 | Discharge: 2017-06-03 | Disposition: A | Discharge status: Home / Self Care | Payer: Medicare HMO | Attending: Emergency Medicine | Admitting: Emergency Medicine

## 2017-06-02 DIAGNOSIS — Y999 Unspecified external cause status: Secondary | ICD-10-CM | POA: Insufficient documentation

## 2017-06-02 DIAGNOSIS — Y939 Activity, unspecified: Secondary | ICD-10-CM | POA: Insufficient documentation

## 2017-06-02 DIAGNOSIS — J449 Chronic obstructive pulmonary disease, unspecified: Secondary | ICD-10-CM | POA: Insufficient documentation

## 2017-06-02 DIAGNOSIS — Y929 Unspecified place or not applicable: Secondary | ICD-10-CM | POA: Diagnosis not present

## 2017-06-02 DIAGNOSIS — S301XXA Contusion of abdominal wall, initial encounter: Secondary | ICD-10-CM

## 2017-06-02 DIAGNOSIS — Z96641 Presence of right artificial hip joint: Secondary | ICD-10-CM | POA: Diagnosis not present

## 2017-06-02 DIAGNOSIS — Z79899 Other long term (current) drug therapy: Secondary | ICD-10-CM | POA: Insufficient documentation

## 2017-06-02 DIAGNOSIS — E119 Type 2 diabetes mellitus without complications: Secondary | ICD-10-CM | POA: Insufficient documentation

## 2017-06-02 DIAGNOSIS — Z87891 Personal history of nicotine dependence: Secondary | ICD-10-CM | POA: Diagnosis not present

## 2017-06-02 DIAGNOSIS — R1011 Right upper quadrant pain: Secondary | ICD-10-CM

## 2017-06-02 DIAGNOSIS — I10 Essential (primary) hypertension: Secondary | ICD-10-CM | POA: Diagnosis not present

## 2017-06-02 DIAGNOSIS — X509XXA Other and unspecified overexertion or strenuous movements or postures, initial encounter: Secondary | ICD-10-CM | POA: Insufficient documentation

## 2017-06-02 DIAGNOSIS — K91872 Postprocedural seroma of a digestive system organ or structure following a digestive system procedure: Secondary | ICD-10-CM | POA: Diagnosis not present

## 2017-06-02 DIAGNOSIS — S3991XA Unspecified injury of abdomen, initial encounter: Secondary | ICD-10-CM | POA: Diagnosis present

## 2017-06-02 DIAGNOSIS — E039 Hypothyroidism, unspecified: Secondary | ICD-10-CM | POA: Diagnosis not present

## 2017-06-02 DIAGNOSIS — R109 Unspecified abdominal pain: Secondary | ICD-10-CM | POA: Diagnosis not present

## 2017-06-02 LAB — CBC
HCT: 41 % (ref 36.0–46.0)
Hemoglobin: 13.3 g/dL (ref 12.0–15.0)
MCH: 30.8 pg (ref 26.0–34.0)
MCHC: 32.4 g/dL (ref 30.0–36.0)
MCV: 94.9 fL (ref 78.0–100.0)
Platelets: 283 10*3/uL (ref 150–400)
RBC: 4.32 MIL/uL (ref 3.87–5.11)
RDW: 13.6 % (ref 11.5–15.5)
WBC: 11.7 10*3/uL — ABNORMAL HIGH (ref 4.0–10.5)

## 2017-06-02 LAB — COMPREHENSIVE METABOLIC PANEL
ALT: 16 U/L (ref 14–54)
AST: 15 U/L (ref 15–41)
Albumin: 4 g/dL (ref 3.5–5.0)
Alkaline Phosphatase: 130 U/L — ABNORMAL HIGH (ref 38–126)
Anion gap: 7 (ref 5–15)
BUN: 15 mg/dL (ref 6–20)
CO2: 26 mmol/L (ref 22–32)
Calcium: 9 mg/dL (ref 8.9–10.3)
Chloride: 108 mmol/L (ref 101–111)
Creatinine, Ser: 1.13 mg/dL — ABNORMAL HIGH (ref 0.44–1.00)
GFR calc Af Amer: >60 mL/min (ref >60)
GFR calc non Af Amer: 54 mL/min — ABNORMAL LOW (ref >60)
Glucose, Bld: 121 mg/dL — ABNORMAL HIGH (ref 65–99)
Potassium: 4.2 mmol/L (ref 3.5–5.1)
Sodium: 141 mmol/L (ref 135–145)
Total Bilirubin: 0.5 mg/dL (ref 0.3–1.2)
Total Protein: 7.1 g/dL (ref 6.5–8.1)

## 2017-06-02 LAB — URINALYSIS, ROUTINE W REFLEX MICROSCOPIC
Bacteria, UA: NONE SEEN
Bilirubin Urine: NEGATIVE
Glucose, UA: NEGATIVE mg/dL
Ketones, ur: NEGATIVE mg/dL
Leukocytes, UA: NEGATIVE
Nitrite: NEGATIVE
Protein, ur: NEGATIVE mg/dL
Specific Gravity, Urine: 1.015 (ref 1.005–1.030)
pH: 5 (ref 5.0–8.0)

## 2017-06-02 LAB — I-STAT BETA HCG BLOOD, ED (MC, WL, AP ONLY): I-stat hCG, quantitative: <5 m[IU]/mL (ref <5)

## 2017-06-02 LAB — LIPASE, BLOOD: Lipase: 37 U/L (ref 11–51)

## 2017-06-02 NOTE — ED Notes (Signed)
Went to lobby to get pt for triage pt seen eating a meal from the food truck and drinking a pepsi.

## 2017-06-02 NOTE — ED Triage Notes (Signed)
Pt reports upper RUQ pain that she believes is a result of the mesh they placed in surgery a month ago.  Pt reports a shooting pain when she moves or upon palpitations.  She has an appointment on Wednesday w/ the surgeon but "I'm scared and it hurts too much to wait."

## 2017-06-03 ENCOUNTER — Emergency Department (HOSPITAL_COMMUNITY): Payer: Medicare HMO

## 2017-06-03 DIAGNOSIS — S301XXA Contusion of abdominal wall, initial encounter: Secondary | ICD-10-CM | POA: Diagnosis not present

## 2017-06-03 DIAGNOSIS — E119 Type 2 diabetes mellitus without complications: Secondary | ICD-10-CM | POA: Diagnosis not present

## 2017-06-03 DIAGNOSIS — Z87891 Personal history of nicotine dependence: Secondary | ICD-10-CM | POA: Diagnosis not present

## 2017-06-03 DIAGNOSIS — Z96641 Presence of right artificial hip joint: Secondary | ICD-10-CM | POA: Diagnosis not present

## 2017-06-03 DIAGNOSIS — I10 Essential (primary) hypertension: Secondary | ICD-10-CM | POA: Diagnosis not present

## 2017-06-03 DIAGNOSIS — X509XXA Other and unspecified overexertion or strenuous movements or postures, initial encounter: Secondary | ICD-10-CM | POA: Diagnosis not present

## 2017-06-03 DIAGNOSIS — R109 Unspecified abdominal pain: Secondary | ICD-10-CM | POA: Diagnosis not present

## 2017-06-03 DIAGNOSIS — E039 Hypothyroidism, unspecified: Secondary | ICD-10-CM | POA: Diagnosis not present

## 2017-06-03 DIAGNOSIS — Z79899 Other long term (current) drug therapy: Secondary | ICD-10-CM | POA: Diagnosis not present

## 2017-06-03 DIAGNOSIS — J449 Chronic obstructive pulmonary disease, unspecified: Secondary | ICD-10-CM | POA: Diagnosis not present

## 2017-06-03 MED ORDER — IOPAMIDOL (ISOVUE-300) INJECTION 61%
100.0000 mL | Freq: Once | INTRAVENOUS | Status: AC | PRN
  Administered 2017-06-03: 100 mL via INTRAVENOUS

## 2017-06-03 MED ORDER — ONDANSETRON HCL 4 MG/2ML IJ SOLN
4.0000 mg | Freq: Once | INTRAMUSCULAR | Status: AC
  Administered 2017-06-03: 4 mg via INTRAVENOUS
  Filled 2017-06-03: qty 2

## 2017-06-03 MED ORDER — HYDROCODONE-ACETAMINOPHEN 5-325 MG PO TABS: 1.0000 | ORAL_TABLET | Freq: Four times a day (QID) | ORAL | 0 refills | Status: DC | PRN

## 2017-06-03 MED ORDER — MORPHINE SULFATE (PF) 4 MG/ML IV SOLN
4.0000 mg | Freq: Once | INTRAVENOUS | Status: AC
  Administered 2017-06-03: 4 mg via INTRAVENOUS
  Filled 2017-06-03: qty 1

## 2017-06-03 MED ORDER — IOPAMIDOL (ISOVUE-300) INJECTION 61%
INTRAVENOUS | Status: AC
  Filled 2017-06-03: qty 100

## 2017-06-03 NOTE — ED Provider Notes (Signed)
[facility] EMERGENCY DEPARTMENT Provider Note   CSN: [id number] Arrival date & time: 06/02/17  1936     History   Chief Complaint Chief Complaint  Patient presents with  . Abdominal Pain    HPI Joan [name] is a 57 y.o. female.  HPI  This is a 57 year old female who presents with right upper quadrant pain.  Patient reports that she had ventral hernia repaired with mesh in February.  She had been doing well.  However, last week she noted increasing right upper quadrant pain.  It is worse with sitting up.  There is no increase in pain with eating.  She denies any nausea or vomiting.  She states pain has become "unbearable."  She has a follow-up appointment with her surgeon on Wednesday but she could not wait.  She has been having normal bowel movements.  Currently she rates the pain at 10 out of 10 when sitting up.  She is more comfortable when lying back.  She is concerned the mesh may be "poking into something."  Past Medical History:  Diagnosis Date  . Alcoholism and drug addiction in family   . Anxiety   . Arthritis    "knees, hips, lower back" (09/25/2016)  . Asthma   . Bipolar disorder ([place])   . Chronic bronchitis ([place])    "all the time" (09/25/2016)  . Chronic leg pain    RLE  . Chronic lower back pain   . COPD (chronic obstructive pulmonary disease) ([place])   . Depression   . Emphysema of lung ([place])   . GERD (gastroesophageal reflux disease)   . Headache   . Hepatitis C    "treated back in 2001-2002"  . High cholesterol   . Hypertension   . Hypothyroidism   . Incarcerated ventral hernia 04/04/2017  . On home oxygen therapy    "2L; at nighttime" (04/04/2017)  . OSA on CPAP   . Pneumonia    "all the time" (09/25/2016)  . Positive PPD    with negative chest x ray  . Thyroid disease   . Type II diabetes mellitus [facility])     Patient Active Problem List   Diagnosis Date Noted  . Prediabetes 04/05/2017  . Hypothyroidism due to acquired atrophy of  thyroid 04/05/2017  . Primary osteoarthritis involving multiple joints 04/05/2017  . Incarcerated ventral hernia 04/04/2017  . Chronic constipation 02/10/2017  . Iron deficiency anemia due to dietary causes 02/10/2017  . Osteoarthritis 02/10/2017  . Dyslipidemia 02/10/2017  . H/O total hip arthroplasty, right 02/05/2017  . OSA treated with BiPAP 01/14/2017  . Rash 01/08/2017  . Pre-operative clearance 12/16/2016  . Chronic respiratory failure with hypoxia ([place]) 12/16/2016  . Essential hypertension 11/29/2016  . Back pain 11/29/2016  . Vocal cord dysfunction   . COPD, severe ([place]) 09/24/2016  . Anxiety 09/24/2016  . Steroid-induced diabetes ([place]) 09/24/2016    Past Surgical History:  Procedure Laterality Date  . BACK SURGERY    . CARDIAC CATHETERIZATION    . COLONOSCOPY    . CYST EXCISION     removal of cyst in sinuses  . DILATION AND CURETTAGE OF UTERUS    . FOOT SURGERY Bilateral    bone removed from 4th and 5 th toes and put in pin then took pin out  . HERNIA REPAIR    . INCISION AND DRAINAGE  ~ 2014/2015   "removed mesh & infection"  . INCISIONAL HERNIA REPAIR N/A 04/04/2017   Procedure: LAPAROSCOPIC INCISIONAL  HERNIA REPAIR WITH MESH;  Surgeon: Fanny Skates, MD;  Location: [facility];  Service: General;  Laterality: N/A;  . LACERATION REPAIR Right    repair wrist artery from laceration from ice skate  . LAPAROSCOPIC INCISIONAL / UMBILICAL / VENTRAL HERNIA REPAIR  04/04/2017   LAPAROSCOPIC INCISIONAL HERNIA REPAIR WITH MESH  . LIPOMA EXCISION Right    fattty lipoma in neck  . NASAL SEPTUM SURGERY    . POSTERIOR LUMBAR FUSION  2015/2016 X 2   "added rods and screws"  . TOTAL HIP ARTHROPLASTY Right 02/05/2017   Procedure: RIGHT TOTAL HIP ARTHROPLASTY;  Surgeon: [name], MD;  Location: [facility];  Service: Orthopedics;  Laterality: Right;  . TUBAL LIGATION    . UMBILICAL HERNIA REPAIR  ~ 2013   w/mesh  . UPPER GI ENDOSCOPY       OB History   None      Home  Medications    Prior to Admission medications   Medication Sig Start Date End Date Taking? Authorizing Provider  albuterol (PROVENTIL HFA;VENTOLIN HFA) 108 (90 Base) MCG/ACT inhaler Inhale 2 puffs into the lungs every 6 (six) hours as needed for wheezing or shortness of breath (before activity). 04/23/17  Yes [name], NP  atenolol (TENORMIN) 25 MG tablet Take 1 tablet (25 mg total) by mouth daily. 12/17/16  Yes [name], MD  atorvastatin (LIPITOR) 20 MG tablet Take 20 mg by mouth daily.   Yes [provider]  baclofen (LIORESAL) 10 MG tablet Take 10 mg by mouth at bedtime.  03/08/17  Yes [provider]  budesonide-formoterol (SYMBICORT) 160-4.5 MCG/ACT inhaler Inhale 2 puffs into the lungs 2 (two) times daily. 05/08/17  Yes [name], NP  diphenhydrAMINE (BENADRYL) 25 mg capsule Take 25 mg by mouth 2 (two) times daily.    Yes [provider]  ipratropium (ATROVENT HFA) 17 MCG/ACT inhaler Inhale 2 puffs into the lungs 4 (four) times daily. 05/15/17  Yes [name], NP  levothyroxine (SYNTHROID, LEVOTHROID) 25 MCG tablet Take 1 tablet (25 mcg total) by mouth daily before breakfast. 12/17/16  Yes [name], MD  montelukast (SINGULAIR) 10 MG tablet Take 1 tablet (10 mg total) by mouth at bedtime. 04/23/17  Yes [name], NP  omeprazole (PRILOSEC) 20 MG capsule Take 20 mg by mouth 2 (two) times daily before a meal.    Yes [provider]  pregabalin (LYRICA) 75 MG capsule Take 1 capsule (75 mg total) by mouth 2 (two) times daily. 02/19/17  Yes Gerlene Fee, NP  roflumilast (DALIRESP) 500 MCG TABS tablet Take 1 tablet (500 mcg total) by mouth daily. 05/20/17  Yes [name], NP  valACYclovir (VALTREX) 500 MG tablet Take 500 mg by mouth daily.    Yes [provider]  venlafaxine XR (EFFEXOR-XR) 150 MG 24 hr capsule Take 1 capsule (150 mg total) by mouth daily with breakfast. 12/16/16  Yes [name], MD   aspirin EC 325 MG tablet Take 1 tablet (325 mg total) by mouth 2 (two) times daily. Patient not taking: Reported on 06/03/2017 02/07/17   Ardeen Jourdain, PA-C  CHANTIX CONTINUING MONTH PAK 1 MG tablet TAKE 1 TABLET BY MOUTH TWICE A DAY Patient not taking: Reported on 06/03/2017 03/10/17   [name], MD  HYDROcodone-acetaminophen (NORCO/VICODIN) 5-325 MG tablet Take 1 tablet by mouth every 6 (six) hours as needed. 06/03/17   Nelson Noone, Barbette Hair, MD  ipratropium-albuterol (DUONEB) 0.5-2.5 (3) MG/3ML  SOLN Take 3 mLs by nebulization every 6 (six) hours as needed (for wheezing/shortness of breath). Patient not taking: Reported on 06/03/2017 04/23/17   [name], NP  naproxen (NAPROSYN) 500 MG tablet Take 1 tablet (500 mg total) by mouth 2 (two) times daily with a meal. Patient not taking: Reported on 06/03/2017 04/03/17   [name], MD  Tiotropium Bromide Monohydrate (SPIRIVA RESPIMAT) 2.5 MCG/ACT AERS Inhale 2 puffs into the lungs once for 1 dose. Patient not taking: Reported on 06/03/2017 04/23/17 06/03/24  [name], NP  traMADol (ULTRAM) 50 MG tablet Take 1 tablet (50 mg total) by mouth every 6 (six) hours as needed for moderate pain. Patient not taking: Reported on 06/03/2017 04/08/17   Fanny Skates, MD  umeclidinium bromide (INCRUSE ELLIPTA) 62.5 MCG/INH AEPB Inhale 1 puff into the lungs daily. Patient not taking: Reported on 06/03/2017 03/12/17   [name], MD    Family History Family History  Problem Relation Age of Onset  . Diabetes Mother   . Hypertension Mother   . Hypertension Father   . Cancer Father        lung    Social History Social History   Tobacco Use  . Smoking status: Former Smoker    Packs/day: 1.00    Years: 44.00    Pack years: 44.00    Types: Cigarettes    Last attempt to quit: 11/22/2016    Years since quitting: 0.5  . Smokeless tobacco: Never Used  . Tobacco comment: pt quit smoking 11/22/16 and currently taking chantix  (04/04/2017)    Substance Use Topics  . Alcohol use: Yes    Comment: 04/04/2017 "quit years ago"  . Drug use: Yes    Types: "Crack" cocaine    Comment: 04/04/2017 "nothing since 01/12/2012"     Allergies   Dilaudid [hydromorphone hcl]; Keflex [cephalexin]; Prednisone; Shellfish allergy; and Tetracyclines & related   Review of Systems Review of Systems  Constitutional: Negative for fever.  Respiratory: Negative for shortness of breath.   Cardiovascular: Negative for chest pain.  Gastrointestinal: Positive for abdominal pain. Negative for constipation, diarrhea, nausea and vomiting.  Genitourinary: Negative for dysuria.  Musculoskeletal: Negative for back pain.  All other systems reviewed and are negative.    Physical Exam Updated Vital Signs BP (!) 147/91   Pulse 74   Temp 98.9 F (37.2 C) (Oral)   Resp 14   Ht 5\' 8"  (1.727 m)   Wt 98.4 kg (217 lb)   SpO2 95%   BMI 32.99 kg/m   Physical Exam  Constitutional: She is oriented to person, place, and time. She appears well-developed and well-nourished.  Obese, no acute distress  HENT:  Head: Normocephalic and atraumatic.  Neck: Neck supple.  Cardiovascular: Normal rate, regular rhythm and normal heart sounds.  Pulmonary/Chest: Effort normal. No respiratory distress. She has no wheezes.  Abdominal: Soft. Bowel sounds are normal. There is tenderness in the right upper quadrant.  Upper abdominal scars, no obvious residual hernia, tenderness palpation right upper quadrant without mass or overlying skin changes  Neurological: She is alert and oriented to person, place, and time.  Skin: Skin is warm and dry.  Psychiatric: She has a normal mood and affect.  Nursing note and vitals reviewed.    ED Treatments / Results  Labs (all labs ordered are listed, but only abnormal results are displayed) Labs Reviewed  COMPREHENSIVE METABOLIC PANEL - Abnormal; Notable for the following components:      Result  Value   Glucose, Bld 121 (*)     Creatinine, Ser 1.13 (*)    Alkaline Phosphatase 130 (*)    GFR calc non Af Amer 54 (*)    All other components within normal limits  CBC - Abnormal; Notable for the following components:   WBC 11.7 (*)    All other components within normal limits  URINALYSIS, ROUTINE W REFLEX MICROSCOPIC - Abnormal; Notable for the following components:   Hgb urine dipstick SMALL (*)    Squamous Epithelial / LPF 0-5 (*)    All other components within normal limits  LIPASE, BLOOD  I-STAT BETA HCG BLOOD, ED ([facility], [facility], [facility] ONLY)    EKG None  Radiology Ct Abdomen Pelvis W Contrast  Result Date: 06/03/2017 CLINICAL DATA:  57 y/o F; right upper quadrant abdominal pain. Mesh placement 1 month ago. EXAM: CT ABDOMEN AND PELVIS WITH CONTRAST TECHNIQUE: Multidetector CT imaging of the abdomen and pelvis was performed using the standard protocol following bolus administration of intravenous contrast. CONTRAST:  140mL ISOVUE-300 IOPAMIDOL (ISOVUE-300) INJECTION 61% COMPARISON:  11/21/2016 CT abdomen and pelvis. FINDINGS: Lower chest: Emphysema in the lung bases. Hepatobiliary: No focal liver abnormality is seen. No gallstones, gallbladder wall thickening, or biliary dilatation. Pancreas: Unremarkable. No pancreatic ductal dilatation or surrounding inflammatory changes. Spleen: Punctate calcifications within the spleen, likely granulomas. No additional focal splenic lesion identified. Adrenals/Urinary Tract: Adrenal glands are unremarkable. Kidneys are normal, without renal calculi, focal lesion, or hydronephrosis. Bladder is unremarkable. Stomach/Bowel: Stomach is within normal limits. Appendix appears normal. No evidence of bowel wall thickening, distention, or inflammatory changes. Vascular/Lymphatic: Aortic atherosclerosis. No enlarged abdominal or pelvic lymph nodes. Reproductive: Uterus and bilateral adnexa are unremarkable. Other: Mesh repair of umbilical hernia. Small fluid collection in the extraperitoneal abdominal  wall superficial to the mesh in the umbilical area measuring 3.1 x 2.4 x 2.8 cm (AP x ML x CC series 3, image 40 and series 7, image 67). No residual hernia of peritoneal fat or bowel. Musculoskeletal: Stable L5-S1 posterior instrumented fusion in grade 1 anterolisthesis. No acute fracture identified. Right hip replacement. IMPRESSION: 1. Mesh repair of umbilical hernia. Fluid collection within the umbilical area superficial to mesh measuring up to 3.1 cm. This probably represents a postoperative seroma, infection is unlikely as there is minimal edema. No residual/recurrent hernia of peritoneal fat or bowel. 2. Otherwise stable chronic findings as above. Electronically Signed   By: Kristine Garbe M.D.   On: 06/03/2017 05:41    Procedures Procedures (including critical care time)  Medications Ordered in ED Medications  iopamidol (ISOVUE-300) 61 % injection (has no administration in time range)  morphine 4 MG/ML injection 4 mg (4 mg Intravenous Given 06/03/17 0413)  ondansetron (ZOFRAN) injection 4 mg (4 mg Intravenous Given 06/03/17 0413)  iopamidol (ISOVUE-300) 61 % injection 100 mL (100 mLs Intravenous Contrast Given 06/03/17 0500)     Initial Impression / Assessment and Plan / ED Course  I have reviewed the triage vital signs and the nursing notes.  Pertinent labs & imaging results that were available during my care of the patient were reviewed by me and considered in my medical decision making (see chart for details).     Patient presents with right upper quadrant pain hernia repair with mesh placement.  She is tender on exam in the right upper quadrant.  No overlying skin changes.  Her lab work up is largely reassuring.  She is concerned that she may have a postoperative infection or complication.  CT scan obtained.  It does show a small seroma but does not look infected.  This does not correspond in location to where her pain is.  Likely incidental.  Patient given a short course of  Norco.  Recommend follow-up with Dr. Dalbert Batman on Wednesday as scheduled.  After history, exam, and medical workup I feel the patient has been appropriately medically screened and is safe for discharge home. Pertinent diagnoses were discussed with the patient. Patient was given return precautions.   Final Clinical Impressions(s) / ED Diagnoses   Final diagnoses:  Right upper quadrant abdominal pain  Abdominal wall seroma, initial encounter    ED Discharge Orders        Ordered    HYDROcodone-acetaminophen (NORCO/VICODIN) 5-325 MG tablet  Every 6 hours PRN     06/03/17 0647       Merryl Hacker, MD 06/03/17 [id number]

## 2017-06-03 NOTE — Discharge Instructions (Addendum)
You were seen today for abdominal pain.  Your CT scan is largely reassuring.  You do have a small fluid collection postoperatively but it is not corresponding to where you were having pain.  Follow-up closely with your general surgeon.

## 2017-06-03 NOTE — ED Notes (Signed)
Patient Alert and oriented to baseline. Stable and ambulatory to baseline. Patient verbalized understanding of the discharge instructions.  Patient belongings were taken by the patient.   

## 2017-06-04 DIAGNOSIS — R69 Illness, unspecified: Secondary | ICD-10-CM | POA: Diagnosis not present

## 2017-06-06 NOTE — Progress Notes (Signed)
Preop for surgery on 06/11/2017.  Need orders in epic.  Thank You.

## 2017-06-08 DIAGNOSIS — J9611 Chronic respiratory failure with hypoxia: Secondary | ICD-10-CM | POA: Diagnosis not present

## 2017-06-08 DIAGNOSIS — J449 Chronic obstructive pulmonary disease, unspecified: Secondary | ICD-10-CM | POA: Diagnosis not present

## 2017-06-08 DIAGNOSIS — J441 Chronic obstructive pulmonary disease with (acute) exacerbation: Secondary | ICD-10-CM | POA: Diagnosis not present

## 2017-06-09 ENCOUNTER — Encounter: Payer: Self-pay | Admitting: Internal Medicine

## 2017-06-09 ENCOUNTER — Ambulatory Visit (INDEPENDENT_AMBULATORY_CARE_PROVIDER_SITE_OTHER): Payer: Medicare HMO | Admitting: Internal Medicine

## 2017-06-09 DIAGNOSIS — G4733 Obstructive sleep apnea (adult) (pediatric): Secondary | ICD-10-CM

## 2017-06-09 DIAGNOSIS — K436 Other and unspecified ventral hernia with obstruction, without gangrene: Secondary | ICD-10-CM

## 2017-06-09 DIAGNOSIS — J449 Chronic obstructive pulmonary disease, unspecified: Secondary | ICD-10-CM

## 2017-06-09 MED ORDER — HYDROCODONE-ACETAMINOPHEN 5-325 MG PO TABS: 1.0000 | ORAL_TABLET | Freq: Every day | ORAL | 0 refills | Status: DC | PRN

## 2017-06-09 MED ORDER — MELATONIN 5 MG PO CAPS: 5.0000 mg | ORAL_CAPSULE | Freq: Every day | ORAL | 0 refills | Status: DC

## 2017-06-09 MED ORDER — VARENICLINE TARTRATE 1 MG PO TABS: 1.0000 mg | ORAL_TABLET | Freq: Two times a day (BID) | ORAL | 1 refills | Status: DC

## 2017-06-09 NOTE — Patient Instructions (Addendum)
We have sent in the hydrocodone that you can take 1/2 pill at night time until the surgery.   We have sent in the melatonin for the sleep and the chantix.   See if they can do the procedure for the stomach while you are in the hospital with the hip surgery.

## 2017-06-09 NOTE — Progress Notes (Signed)
   Subjective:    Patient ID: Joan [name], female    DOB: 09-Jul-1960, 57 y.o.   MRN: [id number]  HPI The patient is a 57 YO female coming in for follow up but with new abdominal pain (after hernia surgery, they have given her hydrocodone at the ER which she is taking 1/2 pill at night time and is almost out, surgeon gave her toradol which is not helping much, due to have hip replacement next week, denies nausea or vomiting, denies diarrhea or constipation, denies blood in stool). She is hoping that she can be treated for this at the same time as the hip replacement. Denies nausea or vomiting. Denies constipation or diarrhea or blood in stools.   [place] narcotic database reviewed and no inappropriate or unknown fills.   Review of Systems  Constitutional: Negative.   Respiratory: Negative for cough, chest tightness and shortness of breath.   Cardiovascular: Negative for chest pain, palpitations and leg swelling.  Gastrointestinal: Positive for abdominal pain. Negative for abdominal distention, constipation, diarrhea, nausea and vomiting.  Musculoskeletal: Negative.   Skin: Negative.   Neurological: Negative.   Psychiatric/Behavioral: Positive for sleep disturbance.      Objective:   Physical Exam  Constitutional: She is oriented to person, place, and time. She appears well-developed and well-nourished.  HENT:  Head: Normocephalic and atraumatic.  Eyes: EOM are normal.  Neck: Normal range of motion.  Cardiovascular: Normal rate and regular rhythm.  Pulmonary/Chest: Effort normal and breath sounds normal. No respiratory distress. She has no wheezes. She has no rales.  Not smelling of smoke  Abdominal: Soft. Bowel sounds are normal. She exhibits no distension. There is tenderness. There is no rebound.  Tenderness ruq to midline with palpation, no radiation and no guarding.   Musculoskeletal: She exhibits no edema.  Neurological: She is alert and oriented to person, place, and time.  Coordination normal.  Skin: Skin is warm and dry.   Vitals:   06/09/17 1055  BP: (!) 150/90  Pulse: 70  Temp: 98.2 F (36.8 C)  TempSrc: Oral  SpO2: 94%  Weight: 219 lb (99.3 kg)  Height: 5\' 8"  (1.727 m)      Assessment & Plan:

## 2017-06-10 ENCOUNTER — Other Ambulatory Visit (HOSPITAL_COMMUNITY): Payer: Self-pay | Admitting: Emergency Medicine

## 2017-06-10 ENCOUNTER — Encounter: Payer: Self-pay | Admitting: Internal Medicine

## 2017-06-10 DIAGNOSIS — Z48815 Encounter for surgical aftercare following surgery on the digestive system: Secondary | ICD-10-CM | POA: Diagnosis not present

## 2017-06-10 DIAGNOSIS — D509 Iron deficiency anemia, unspecified: Secondary | ICD-10-CM | POA: Diagnosis not present

## 2017-06-10 DIAGNOSIS — E119 Type 2 diabetes mellitus without complications: Secondary | ICD-10-CM | POA: Diagnosis not present

## 2017-06-10 DIAGNOSIS — I10 Essential (primary) hypertension: Secondary | ICD-10-CM | POA: Diagnosis not present

## 2017-06-10 DIAGNOSIS — J9611 Chronic respiratory failure with hypoxia: Secondary | ICD-10-CM | POA: Diagnosis not present

## 2017-06-10 DIAGNOSIS — K219 Gastro-esophageal reflux disease without esophagitis: Secondary | ICD-10-CM | POA: Diagnosis not present

## 2017-06-10 DIAGNOSIS — R69 Illness, unspecified: Secondary | ICD-10-CM | POA: Diagnosis not present

## 2017-06-10 DIAGNOSIS — J439 Emphysema, unspecified: Secondary | ICD-10-CM | POA: Diagnosis not present

## 2017-06-10 DIAGNOSIS — M159 Polyosteoarthritis, unspecified: Secondary | ICD-10-CM | POA: Diagnosis not present

## 2017-06-10 NOTE — Progress Notes (Signed)
LOV/Pulmonary clearance Joan [name] 05-15-17 epic   LOV Dr Pricilla Holm 06-09-17 epic   EKG 04-05-17 epic   CT Abdomen 06-03-17 epic   LABS 06-02-17 [urinalysis, cbc, cmp, lipase, serum preg] epic   ECHO 09-26-16 epic   CXR 02-03-17 epic

## 2017-06-10 NOTE — Assessment & Plan Note (Signed)
Rx for 5 hydrocodone pills to get her through to surgery and she understands that the plan of action will have to come from surgeon in the future. [place] narcotic database reviewed and no inappropriate fills.

## 2017-06-10 NOTE — Assessment & Plan Note (Signed)
Rx for melatonin for sleep and continue bipap.

## 2017-06-10 NOTE — Patient Instructions (Signed)
Joan [name]  06/10/2017   Your procedure is scheduled on: 06-18-17   Report to [facility] Main  Entrance    Report to admitting at 7:30AM    Call this number if you have problems the morning of surgery [phone]     Remember: Do not eat food or drink liquids :After Midnight.    PLEASE BRING CPAP MASK AND TUBING. DEVICE WILL BE PROVIDED!   Take these medicines the morning of surgery with A SIP OF WATER: ALBUTEROL NEBULIZER, SYMBICORT INHALER, ATROVENT INHALER, ALBUTEROL INHALER IF NEEDED (PLEASE BRING) ,atenolol, atorvastatin, benadryl, levothyroxine, venlafaxine, hydrocodone if needed                                 You may not have any metal on your body including hair pins and              piercings  Do not wear jewelry, make-up, lotions, powders or perfumes, deodorant             Do not wear nail polish.  Do not shave  48 hours prior to surgery.           Do not bring valuables to the hospital. [place].  Contacts, dentures or bridgework may not be worn into surgery.  Leave suitcase in the car. After surgery it may be brought to your room.                Please read over the following fact sheets you were given: _____________________________________________________________________             How to Manage Your Diabetes Before and After Surgery  Why is it important to control my blood sugar before and after surgery? . Improving blood sugar levels before and after surgery helps healing and can limit problems. . A way of improving blood sugar control is eating a healthy diet by: o  Eating less sugar and carbohydrates o  Increasing activity/exercise o  Talking with your doctor about reaching your blood sugar goals . High blood sugars (greater than 180 mg/dL) can raise your risk of infections and slow your recovery, so you will need to focus on controlling your diabetes during the weeks before  surgery. . Make sure that the doctor who takes care of your diabetes knows about your planned surgery including the date and location.   Patient Signature:  Date:   Nurse Signature:  Date:   Reviewed and Endorsed by [facility] Patient Education Committee, August 2015   [facility] - Preparing for Surgery Before surgery, you can play an important role.  Because skin is not sterile, your skin needs to be as free of germs as possible.  You can reduce the number of germs on your skin by washing with CHG (chlorahexidine gluconate) soap before surgery.  CHG is an antiseptic cleaner which kills germs and bonds with the skin to continue killing germs even after washing. Please DO NOT use if you have an allergy to CHG or antibacterial soaps.  If your skin becomes reddened/irritated stop using the CHG and inform your nurse when you arrive at Short Stay. Do not shave (including legs and underarms) for at least 48 hours prior to the first  CHG shower.  You may shave your face/neck. Please follow these instructions carefully:  1.  Shower with CHG Soap the night before surgery and the  morning of Surgery.  2.  If you choose to wash your hair, wash your hair first as usual with your  normal  shampoo.  3.  After you shampoo, rinse your hair and body thoroughly to remove the  shampoo.                           4.  Use CHG as you would any other liquid soap.  You can apply chg directly  to the skin and wash                       Gently with a scrungie or clean washcloth.  5.  Apply the CHG Soap to your body ONLY FROM THE NECK DOWN.   Do not use on face/ open                           Wound or open sores. Avoid contact with eyes, ears mouth and genitals (private parts).                       Wash face,  Genitals (private parts) with your normal soap.             6.  Wash thoroughly, paying special attention to the area where your surgery  will be performed.  7.  Thoroughly rinse your body with warm water from  the neck down.  8.  DO NOT shower/wash with your normal soap after using and rinsing off  the CHG Soap.                9.  Pat yourself dry with a clean towel.            10.  Wear clean pajamas.            11.  Place clean sheets on your bed the night of your first shower and do not  sleep with pets. Day of Surgery : Do not apply any lotions/deodorants the morning of surgery.  Please wear clean clothes to the hospital/surgery center.  FAILURE TO FOLLOW THESE INSTRUCTIONS MAY RESULT IN THE CANCELLATION OF YOUR SURGERY PATIENT SIGNATURE_________________________________  NURSE SIGNATURE__________________________________  ________________________________________________________________________

## 2017-06-10 NOTE — Assessment & Plan Note (Signed)
She has done well with the surgeries and still off smoking. Will refill chantix and continue for another 3 months of therapy as she needs to be smoke free.

## 2017-06-11 ENCOUNTER — Other Ambulatory Visit: Payer: Self-pay

## 2017-06-11 ENCOUNTER — Encounter (HOSPITAL_COMMUNITY): Payer: Self-pay

## 2017-06-11 ENCOUNTER — Encounter (HOSPITAL_COMMUNITY)
Admission: RE | Admit: 2017-06-11 | Discharge: 2017-06-11 | Disposition: A | Discharge status: Home / Self Care | Payer: Medicare HMO | Source: Ambulatory Visit | Attending: Orthopedic Surgery | Admitting: Orthopedic Surgery

## 2017-06-11 DIAGNOSIS — Z01812 Encounter for preprocedural laboratory examination: Secondary | ICD-10-CM | POA: Diagnosis not present

## 2017-06-11 HISTORY — DX: Tachycardia, unspecified: R00.0

## 2017-06-11 LAB — HEMOGLOBIN A1C
Hgb A1c MFr Bld: 6.1 % — ABNORMAL HIGH (ref 4.8–5.6)
Mean Plasma Glucose: 128.37 mg/dL

## 2017-06-11 LAB — SURGICAL PCR SCREEN
MRSA, PCR: NEGATIVE
Staphylococcus aureus: NEGATIVE

## 2017-06-11 NOTE — Progress Notes (Addendum)
RN CALLED AND SPOKE WITH DR Sangaree. Per Dr Tobias Alexander , THESES RECOMMENDATIONS WILL BE REVIEWED DAY OF SURGERY AND FINAL DECISION WILL BE MADE BY ANESTHESIA. COPY OF PULMONARY CLEARANCE RECOMMENDATIONS PLACED ON PATIENT CHART FOR ANESTHESIA TO VIEW DAY OF SURGERY.

## 2017-06-17 ENCOUNTER — Telehealth: Payer: Self-pay | Admitting: Internal Medicine

## 2017-06-17 NOTE — Telephone Encounter (Signed)
Called Joan [name] gave MD response. She states they need orderto be faxed over to [facility]. Notified will faxed order top # below.Joan KitchenJohny Chess

## 2017-06-17 NOTE — Progress Notes (Signed)
Called and LVMM for patient to arrive at 0530am on 4/24 and surgery time of 0730am.

## 2017-06-17 NOTE — H&P (Signed)
TOTAL HIP ADMISSION H&P  Patient is admitted for left total hip arthroplasty.  Subjective:  Chief Complaint: left hip pain  HPI: Joan [name], 57 y.o. female, has a history of pain and functional disability in the left hip(s) due to arthritis and patient has failed non-surgical conservative treatments for greater than 12 weeks to include NSAID's and/or analgesics, flexibility and strengthening excercises, use of assistive devices and activity modification.  Onset of symptoms was gradual starting 3 years ago with gradually worsening course since that time.The patient noted no past surgery on the left hip(s).  Patient currently rates pain in the left hip at 8 out of 10 with activity. Patient has night pain, worsening of pain with activity and weight bearing, pain that interfers with activities of daily living and pain with passive range of motion. Patient has evidence of subchondral sclerosis, periarticular osteophytes and joint space narrowing by imaging studies. This condition presents safety issues increasing the risk of falls. There is no current active infection.  Patient Active Problem List   Diagnosis Date Noted  . Prediabetes 04/05/2017  . Hypothyroidism due to acquired atrophy of thyroid 04/05/2017  . Primary osteoarthritis involving multiple joints 04/05/2017  . Incarcerated ventral hernia 04/04/2017  . Chronic constipation 02/10/2017  . Iron deficiency anemia due to dietary causes 02/10/2017  . Osteoarthritis 02/10/2017  . Dyslipidemia 02/10/2017  . H/O total hip arthroplasty, right 02/05/2017  . OSA treated with BiPAP 01/14/2017  . Pre-operative clearance 12/16/2016  . Chronic respiratory failure with hypoxia ([place]) 12/16/2016  . Essential hypertension 11/29/2016  . Back pain 11/29/2016  . Vocal cord dysfunction   . COPD, severe ([place]) 09/24/2016  . Anxiety 09/24/2016  . Steroid-induced diabetes ([place]) 09/24/2016   Past Medical History:  Diagnosis Date  . Alcoholism and drug  addiction in family   . Anxiety   . Arthritis    "knees, hips, lower back" (09/25/2016)  . Asthma   . Bipolar disorder ([place])   . Chronic bronchitis ([place])    "all the time" (09/25/2016)  . Chronic leg pain    RLE  . Chronic lower back pain   . COPD (chronic obstructive pulmonary disease) ([place])   . Depression   . Emphysema of lung ([place])   . GERD (gastroesophageal reflux disease)   . Headache   . Hepatitis C    "treated back in 2001-2002"  . High cholesterol   . Hypertension   . Hypothyroidism   . Incarcerated ventral hernia 04/04/2017  . On home oxygen therapy    "2L; at nighttime" (04/04/2017)  . OSA on CPAP   . Pneumonia    "all the time" (09/25/2016)  . Positive PPD    with negative chest x ray  . Tachycardia    patient reports with exertion ; denies any other conditions   . Thyroid disease   . Type II diabetes mellitus ([place])     Past Surgical History:  Procedure Laterality Date  . BACK SURGERY    . CARDIAC CATHETERIZATION    . COLONOSCOPY    . CYST EXCISION     removal of cyst in sinuses  . DILATION AND CURETTAGE OF UTERUS    . FOOT SURGERY Bilateral    bone removed from 4th and 5 th toes and put in pin then took pin out  . HERNIA REPAIR    . INCISION AND DRAINAGE  ~ 2014/2015   "removed mesh & infection"  . INCISIONAL HERNIA REPAIR N/A 04/04/2017   Procedure: LAPAROSCOPIC  INCISIONAL HERNIA REPAIR WITH MESH;  Surgeon: Fanny Skates, MD;  Location: [facility];  Service: General;  Laterality: N/A;  . LACERATION REPAIR Right    repair wrist artery from laceration from ice skate  . LAPAROSCOPIC INCISIONAL / UMBILICAL / VENTRAL HERNIA REPAIR  04/04/2017   LAPAROSCOPIC INCISIONAL HERNIA REPAIR WITH MESH  . LIPOMA EXCISION Right    fattty lipoma in neck  . NASAL SEPTUM SURGERY    . POSTERIOR LUMBAR FUSION  2015/2016 X 2   "added rods and screws"  . TOTAL HIP ARTHROPLASTY Right 02/05/2017   Procedure: RIGHT TOTAL HIP ARTHROPLASTY;  Surgeon: [name], MD;  Location: [facility];  Service: Orthopedics;  Laterality: Right;  . TUBAL LIGATION    . UMBILICAL HERNIA REPAIR  ~ 2013   w/mesh  . UPPER GI ENDOSCOPY      No current facility-administered medications for this encounter.    Current Outpatient Medications  Medication Sig Dispense Refill Last Dose  . albuterol (PROVENTIL HFA;VENTOLIN HFA) 108 (90 Base) MCG/ACT inhaler Inhale 2 puffs into the lungs every 6 (six) hours as needed for wheezing or shortness of breath (before activity). 3 Inhaler 1 Taking  . albuterol (PROVENTIL) (2.5 MG/3ML) 0.083% nebulizer solution Take 2.5 mg by nebulization every 6 (six) hours as needed for wheezing or shortness of breath.   Taking  . atenolol (TENORMIN) 25 MG tablet Take 1 tablet (25 mg total) by mouth daily. 90 tablet 2 Taking  . atorvastatin (LIPITOR) 20 MG tablet Take 20 mg by mouth daily.   Taking  . baclofen (LIORESAL) 10 MG tablet Take 10 mg by mouth at bedtime.    Taking  . budesonide-formoterol (SYMBICORT) 160-4.5 MCG/ACT inhaler Inhale 2 puffs into the lungs 2 (two) times daily. 3 Inhaler 1 Taking  . diphenhydrAMINE (BENADRYL) 25 mg capsule Take 25 mg by mouth 2 (two) times daily.    Taking  . ipratropium (ATROVENT HFA) 17 MCG/ACT inhaler Inhale 2 puffs into the lungs 4 (four) times daily. (Patient taking differently: Inhale 2 puffs into the lungs daily. ) 3 Inhaler 1 Taking  . levothyroxine (SYNTHROID, LEVOTHROID) 25 MCG tablet Take 1 tablet (25 mcg total) by mouth daily before breakfast. 90 tablet 2 Taking  . montelukast (SINGULAIR) 10 MG tablet Take 1 tablet (10 mg total) by mouth at bedtime. 90 tablet 3 Taking  . naproxen (NAPROSYN) 500 MG tablet Take 1 tablet (500 mg total) by mouth 2 (two) times daily with a meal. (Patient taking differently: Take 500 mg by mouth 2 (two) times daily as needed (for pain.). ) 180 tablet 1 Taking  . nystatin-triamcinolone ointment (MYCOLOG) Apply 1 application topically 2 (two) times daily as needed (applied to skin folds/groins/under  breast skin irritation rash.).   Taking  . pantoprazole (PROTONIX) 40 MG tablet Take 40 mg by mouth 2 (two) times daily.   Taking  . pregabalin (LYRICA) 75 MG capsule Take 1 capsule (75 mg total) by mouth 2 (two) times daily. 60 capsule 0 Taking  . roflumilast (DALIRESP) 500 MCG TABS tablet Take 1 tablet (500 mcg total) by mouth daily. 90 tablet 1 Taking  . valACYclovir (VALTREX) 500 MG tablet Take 500 mg by mouth daily.    Taking  . venlafaxine XR (EFFEXOR-XR) 150 MG 24 hr capsule Take 1 capsule (150 mg total) by mouth daily with breakfast. 90 capsule 2 Taking  . HYDROcodone-acetaminophen (NORCO/VICODIN) 5-325 MG tablet Take 1 tablet by mouth daily as needed. 5 tablet 0   . ketorolac (  TORADOL) 10 MG tablet Take 10 mg by mouth every 6 (six) hours as needed for moderate pain.     . Melatonin 5 MG CAPS Take 1 capsule (5 mg total) by mouth at bedtime. 30 capsule 0   . varenicline (CHANTIX CONTINUING MONTH PAK) 1 MG tablet Take 1 tablet (1 mg total) by mouth 2 (two) times daily. 180 tablet 1    Allergies  Allergen Reactions  . Dilaudid [Hydromorphone Hcl] Other (See Comments)    "heavy sedation"  . Keflex [Cephalexin] Other (See Comments)    unspecified  . Prednisone Other (See Comments)    "counter reacts"  . Shellfish Allergy Other (See Comments)    Stomach cramps  . Tetracyclines & Related Other (See Comments)    unspecified  . Incruse Ellipta [Umeclidinium Bromide] Nausea Only    Social History   Tobacco Use  . Smoking status: Former Smoker    Packs/day: 1.00    Years: 44.00    Pack years: 44.00    Types: Cigarettes    Last attempt to quit: 11/22/2016    Years since quitting: 0.5  . Smokeless tobacco: Never Used  . Tobacco comment: pt quit smoking 11/22/16 and currently taking chantix  (04/04/2017)  Substance Use Topics  . Alcohol use: Yes    Comment: 04/04/2017 "quit years ago"    Family History  Problem Relation Age of Onset  . Diabetes Mother   . Hypertension Mother   .  Hypertension Father   . Cancer Father        lung     Review of Systems  Constitutional: Negative.   HENT: Negative.   Eyes: Negative.   Respiratory: Positive for shortness of breath. Negative for cough, hemoptysis, sputum production and wheezing.   Cardiovascular: Negative.   Gastrointestinal: Negative.   Genitourinary: Negative.   Musculoskeletal: Positive for back pain, joint pain and myalgias. Negative for falls and neck pain.  Skin: Negative.   Neurological: Negative.   Endo/Heme/Allergies: Negative.   Psychiatric/Behavioral: Negative.     Objective:  Physical Exam  Constitutional: She is oriented to person, place, and time. She appears well-developed. No distress.  Obese  HENT:  Head: Normocephalic and atraumatic.  Right Ear: External ear normal.  Left Ear: External ear normal.  Nose: Nose normal.  Mouth/Throat: Oropharynx is clear and moist.  Eyes: Conjunctivae and EOM are normal.  Neck: Normal range of motion. Neck supple.  Cardiovascular: Normal rate, regular rhythm, normal heart sounds and intact distal pulses.  Respiratory: Effort normal. No respiratory distress. She has wheezes.  GI: Bowel sounds are normal. She exhibits no distension. There is no tenderness.  Musculoskeletal:       Right hip: Normal.       Left hip: She exhibits decreased range of motion and decreased strength.       Right knee: Normal.       Left knee: Normal.  Pain with passive and active motion of the left hip. Reduced motion of the left hip. No pain with motion of the right hip. Incision over right total hip healed with no signs of infection.   Neurological: She is alert and oriented to person, place, and time. She has normal strength and normal reflexes. No sensory deficit.  Skin: No rash noted. She is not diaphoretic. No erythema.  Psychiatric: She has a normal mood and affect. Her behavior is normal.     Imaging Review Plain radiographs demonstrate severe degenerative joint disease  of the left hip(s). The  bone quality appears to be good for age and reported activity level.   Preoperative templating of the joint replacement has been completed, documented, and submitted to the Operating Room personnel in order to optimize intra-operative equipment management.    Assessment/Plan:  End stage primary osteoarthritis, left hip(s)  The patient history, physical examination, clinical judgement of the provider and imaging studies are consistent with end stage degenerative joint disease of the left hip(s) and total hip arthroplasty is deemed medically necessary. The treatment options including medical management, injection therapy, arthroscopy and arthroplasty were discussed at length. The risks and benefits of total hip arthroplasty were presented and reviewed. The risks due to aseptic loosening, infection, stiffness, dislocation/subluxation,  thromboembolic complications and other imponderables were discussed.  The patient acknowledged the explanation, agreed to proceed with the plan and consent was signed. Patient is being admitted for inpatient treatment for surgery, pain control, PT, OT, prophylactic antibiotics, VTE prophylaxis, progressive ambulation and ADL's and discharge planning.The patient is planning to be discharged to skilled nursing facility then home with brother.   Therapy Plans: SNF then HHPT  Disposition: Home with brother [name]  Planned DVT prophylaxis: aspirin 325mg  BID  DME needed: none  PCP: Dr. Pricilla Holm Pulmonary: Dr. Chase Caller   Other: morphine IV ok; no dilaudid PO TXA IV  Ardeen Jourdain, PA-C

## 2017-06-17 NOTE — Telephone Encounter (Signed)
Okay for verbal orders. 

## 2017-06-17 NOTE — Progress Notes (Signed)
Called Dr Charlestine Night office to request surgical orders for surgery on 06/18/17.

## 2017-06-17 NOTE — Telephone Encounter (Signed)
Copied from [place] [id number]. Topic: Quick Communication - See Telephone Encounter >> Jun 17, 2017  2:31 PM Aurelio Brash B wrote: CRM for notification. See Telephone encounter for: 06/17/17.  Judeen Hammans from [facility]  states  she recently spoke with pts  brother who she lives with  and he is poa, He is concerned over the fact she hears voices and sounds  ,  this has happen before and she had relapse with drug and alcohol,  pt has agreed to psychiatrist  nurse coming  out for an evaluation  Her contact number is [phone]  and Fax number  for verbal orders [phone]

## 2017-06-18 ENCOUNTER — Encounter (HOSPITAL_COMMUNITY): Payer: Self-pay | Admitting: Certified Registered Nurse Anesthetist

## 2017-06-18 ENCOUNTER — Encounter (HOSPITAL_COMMUNITY)
Admission: RE | Disposition: A | Discharge status: Skilled Nursing Facility | Payer: Self-pay | Source: Ambulatory Visit | Attending: Orthopedic Surgery

## 2017-06-18 ENCOUNTER — Inpatient Hospital Stay (HOSPITAL_COMMUNITY): Payer: Medicare HMO | Admitting: Certified Registered Nurse Anesthetist

## 2017-06-18 ENCOUNTER — Inpatient Hospital Stay (HOSPITAL_COMMUNITY)
Admission: RE | Admit: 2017-06-18 | Discharge: 2017-06-21 | DRG: 470 | Disposition: A | Discharge status: Skilled Nursing Facility | Payer: Medicare HMO | Source: Ambulatory Visit | Attending: Orthopedic Surgery | Admitting: Orthopedic Surgery

## 2017-06-18 ENCOUNTER — Other Ambulatory Visit: Payer: Self-pay

## 2017-06-18 ENCOUNTER — Inpatient Hospital Stay (HOSPITAL_COMMUNITY): Payer: Medicare HMO

## 2017-06-18 DIAGNOSIS — Z96641 Presence of right artificial hip joint: Secondary | ICD-10-CM | POA: Diagnosis not present

## 2017-06-18 DIAGNOSIS — J9611 Chronic respiratory failure with hypoxia: Secondary | ICD-10-CM | POA: Diagnosis present

## 2017-06-18 DIAGNOSIS — F419 Anxiety disorder, unspecified: Secondary | ICD-10-CM | POA: Diagnosis present

## 2017-06-18 DIAGNOSIS — G4733 Obstructive sleep apnea (adult) (pediatric): Secondary | ICD-10-CM | POA: Diagnosis not present

## 2017-06-18 DIAGNOSIS — Z833 Family history of diabetes mellitus: Secondary | ICD-10-CM | POA: Diagnosis not present

## 2017-06-18 DIAGNOSIS — E7800 Pure hypercholesterolemia, unspecified: Secondary | ICD-10-CM | POA: Diagnosis present

## 2017-06-18 DIAGNOSIS — M545 Low back pain: Secondary | ICD-10-CM | POA: Diagnosis present

## 2017-06-18 DIAGNOSIS — F329 Major depressive disorder, single episode, unspecified: Secondary | ICD-10-CM | POA: Diagnosis present

## 2017-06-18 DIAGNOSIS — Z79890 Hormone replacement therapy (postmenopausal): Secondary | ICD-10-CM | POA: Diagnosis not present

## 2017-06-18 DIAGNOSIS — Z96642 Presence of left artificial hip joint: Secondary | ICD-10-CM

## 2017-06-18 DIAGNOSIS — E034 Atrophy of thyroid (acquired): Secondary | ICD-10-CM | POA: Diagnosis present

## 2017-06-18 DIAGNOSIS — Z881 Allergy status to other antibiotic agents status: Secondary | ICD-10-CM | POA: Diagnosis not present

## 2017-06-18 DIAGNOSIS — D508 Other iron deficiency anemias: Secondary | ICD-10-CM | POA: Diagnosis present

## 2017-06-18 DIAGNOSIS — R69 Illness, unspecified: Secondary | ICD-10-CM | POA: Diagnosis not present

## 2017-06-18 DIAGNOSIS — M1612 Unilateral primary osteoarthritis, left hip: Principal | ICD-10-CM | POA: Diagnosis present

## 2017-06-18 DIAGNOSIS — I10 Essential (primary) hypertension: Secondary | ICD-10-CM | POA: Diagnosis not present

## 2017-06-18 DIAGNOSIS — G894 Chronic pain syndrome: Secondary | ICD-10-CM | POA: Diagnosis not present

## 2017-06-18 DIAGNOSIS — K5909 Other constipation: Secondary | ICD-10-CM | POA: Diagnosis present

## 2017-06-18 DIAGNOSIS — J441 Chronic obstructive pulmonary disease with (acute) exacerbation: Secondary | ICD-10-CM | POA: Diagnosis not present

## 2017-06-18 DIAGNOSIS — Z85118 Personal history of other malignant neoplasm of bronchus and lung: Secondary | ICD-10-CM

## 2017-06-18 DIAGNOSIS — Z888 Allergy status to other drugs, medicaments and biological substances status: Secondary | ICD-10-CM | POA: Diagnosis not present

## 2017-06-18 DIAGNOSIS — K219 Gastro-esophageal reflux disease without esophagitis: Secondary | ICD-10-CM | POA: Diagnosis present

## 2017-06-18 DIAGNOSIS — M25552 Pain in left hip: Secondary | ICD-10-CM | POA: Diagnosis present

## 2017-06-18 DIAGNOSIS — S79911A Unspecified injury of right hip, initial encounter: Secondary | ICD-10-CM | POA: Diagnosis not present

## 2017-06-18 DIAGNOSIS — Z9981 Dependence on supplemental oxygen: Secondary | ICD-10-CM | POA: Diagnosis not present

## 2017-06-18 DIAGNOSIS — J383 Other diseases of vocal cords: Secondary | ICD-10-CM | POA: Diagnosis present

## 2017-06-18 DIAGNOSIS — Z91013 Allergy to seafood: Secondary | ICD-10-CM

## 2017-06-18 DIAGNOSIS — J439 Emphysema, unspecified: Secondary | ICD-10-CM | POA: Diagnosis not present

## 2017-06-18 DIAGNOSIS — Z6833 Body mass index (BMI) 33.0-33.9, adult: Secondary | ICD-10-CM

## 2017-06-18 DIAGNOSIS — Z885 Allergy status to narcotic agent status: Secondary | ICD-10-CM | POA: Diagnosis not present

## 2017-06-18 DIAGNOSIS — M6281 Muscle weakness (generalized): Secondary | ICD-10-CM | POA: Diagnosis not present

## 2017-06-18 DIAGNOSIS — Z811 Family history of alcohol abuse and dependence: Secondary | ICD-10-CM

## 2017-06-18 DIAGNOSIS — Z8249 Family history of ischemic heart disease and other diseases of the circulatory system: Secondary | ICD-10-CM

## 2017-06-18 DIAGNOSIS — Z87891 Personal history of nicotine dependence: Secondary | ICD-10-CM

## 2017-06-18 DIAGNOSIS — R7303 Prediabetes: Secondary | ICD-10-CM | POA: Diagnosis present

## 2017-06-18 DIAGNOSIS — E119 Type 2 diabetes mellitus without complications: Secondary | ICD-10-CM | POA: Diagnosis not present

## 2017-06-18 DIAGNOSIS — G8929 Other chronic pain: Secondary | ICD-10-CM | POA: Diagnosis present

## 2017-06-18 DIAGNOSIS — J449 Chronic obstructive pulmonary disease, unspecified: Secondary | ICD-10-CM | POA: Diagnosis not present

## 2017-06-18 DIAGNOSIS — E785 Hyperlipidemia, unspecified: Secondary | ICD-10-CM | POA: Diagnosis not present

## 2017-06-18 DIAGNOSIS — M169 Osteoarthritis of hip, unspecified: Secondary | ICD-10-CM | POA: Diagnosis not present

## 2017-06-18 DIAGNOSIS — Z471 Aftercare following joint replacement surgery: Secondary | ICD-10-CM | POA: Diagnosis not present

## 2017-06-18 HISTORY — PX: TOTAL HIP ARTHROPLASTY: SHX124

## 2017-06-18 LAB — GLUCOSE, CAPILLARY
Glucose-Capillary: 147 mg/dL — ABNORMAL HIGH (ref 65–99)
Glucose-Capillary: 157 mg/dL — ABNORMAL HIGH (ref 65–99)
Glucose-Capillary: 210 mg/dL — ABNORMAL HIGH (ref 65–99)
Glucose-Capillary: 276 mg/dL — ABNORMAL HIGH (ref 65–99)

## 2017-06-18 LAB — TYPE AND SCREEN
ABO/RH(D): B POS
Antibody Screen: NEGATIVE

## 2017-06-18 SURGERY — ARTHROPLASTY, HIP, TOTAL,POSTERIOR APPROACH
Anesthesia: General | Site: Hip | Laterality: Left

## 2017-06-18 MED ORDER — ROCURONIUM BROMIDE 10 MG/ML (PF) SYRINGE
PREFILLED_SYRINGE | INTRAVENOUS | Status: AC
  Filled 2017-06-18: qty 5

## 2017-06-18 MED ORDER — VANCOMYCIN HCL IN DEXTROSE 1-5 GM/200ML-% IV SOLN
1000.0000 mg | Freq: Two times a day (BID) | INTRAVENOUS | Status: AC
  Administered 2017-06-18: 1000 mg via INTRAVENOUS
  Filled 2017-06-18: qty 200

## 2017-06-18 MED ORDER — SODIUM CHLORIDE 0.9 % IJ SOLN
INTRAMUSCULAR | Status: DC | PRN
  Administered 2017-06-18: 20 mL

## 2017-06-18 MED ORDER — METHOCARBAMOL 500 MG PO TABS
500.0000 mg | ORAL_TABLET | Freq: Four times a day (QID) | ORAL | Status: DC | PRN
  Administered 2017-06-18 – 2017-06-21 (×9): 500 mg via ORAL
  Filled 2017-06-18 (×9): qty 1

## 2017-06-18 MED ORDER — THROMBIN (RECOMBINANT) 5000 UNITS EX SOLR
CUTANEOUS | Status: AC
  Filled 2017-06-18: qty 10000

## 2017-06-18 MED ORDER — ALBUTEROL SULFATE HFA 108 (90 BASE) MCG/ACT IN AERS: 2.0000 | INHALATION_SPRAY | Freq: Four times a day (QID) | RESPIRATORY_TRACT | Status: DC | PRN

## 2017-06-18 MED ORDER — ACETAMINOPHEN 325 MG PO TABS
325.0000 mg | ORAL_TABLET | Freq: Four times a day (QID) | ORAL | Status: DC | PRN
  Administered 2017-06-19 – 2017-06-21 (×6): 650 mg via ORAL
  Filled 2017-06-18 (×6): qty 2

## 2017-06-18 MED ORDER — LIDOCAINE 2% (20 MG/ML) 5 ML SYRINGE
INTRAMUSCULAR | Status: AC
  Filled 2017-06-18: qty 5

## 2017-06-18 MED ORDER — MOMETASONE FURO-FORMOTEROL FUM 200-5 MCG/ACT IN AERO
2.0000 | INHALATION_SPRAY | Freq: Two times a day (BID) | RESPIRATORY_TRACT | Status: DC
  Administered 2017-06-18 – 2017-06-21 (×6): 2 via RESPIRATORY_TRACT
  Filled 2017-06-18: qty 8.8

## 2017-06-18 MED ORDER — FENTANYL CITRATE (PF) 100 MCG/2ML IJ SOLN
INTRAMUSCULAR | Status: AC
  Filled 2017-06-18: qty 2

## 2017-06-18 MED ORDER — POLYETHYLENE GLYCOL 3350 17 G PO PACK: 17.0000 g | PACK | Freq: Every day | ORAL | 0 refills | Status: DC | PRN

## 2017-06-18 MED ORDER — PHENYLEPHRINE 40 MCG/ML (10ML) SYRINGE FOR IV PUSH (FOR BLOOD PRESSURE SUPPORT)
PREFILLED_SYRINGE | INTRAVENOUS | Status: AC
Start: 2017-06-18 — End: 2017-06-18
  Filled 2017-06-18: qty 10

## 2017-06-18 MED ORDER — EPHEDRINE 5 MG/ML INJ
INTRAVENOUS | Status: AC
  Filled 2017-06-18: qty 10

## 2017-06-18 MED ORDER — IPRATROPIUM BROMIDE 0.02 % IN SOLN
0.5000 mg | Freq: Four times a day (QID) | RESPIRATORY_TRACT | Status: DC
  Administered 2017-06-18 (×2): 0.5 mg via RESPIRATORY_TRACT
  Filled 2017-06-18 (×2): qty 2.5

## 2017-06-18 MED ORDER — ALBUTEROL SULFATE (2.5 MG/3ML) 0.083% IN NEBU
2.5000 mg | INHALATION_SOLUTION | Freq: Four times a day (QID) | RESPIRATORY_TRACT | Status: DC | PRN
  Administered 2017-06-20 – 2017-06-21 (×3): 2.5 mg via RESPIRATORY_TRACT
  Filled 2017-06-18 (×3): qty 3

## 2017-06-18 MED ORDER — PROPOFOL 10 MG/ML IV BOLUS
INTRAVENOUS | Status: AC
  Filled 2017-06-18: qty 40

## 2017-06-18 MED ORDER — BISACODYL 5 MG PO TBEC: 5.0000 mg | DELAYED_RELEASE_TABLET | Freq: Every day | ORAL | Status: DC | PRN

## 2017-06-18 MED ORDER — OXYCODONE HCL 5 MG PO TABS: 5.0000 mg | ORAL_TABLET | ORAL | Status: DC | PRN

## 2017-06-18 MED ORDER — ROFLUMILAST 500 MCG PO TABS
500.0000 ug | ORAL_TABLET | Freq: Every day | ORAL | Status: DC
  Administered 2017-06-18 – 2017-06-21 (×4): 500 ug via ORAL
  Filled 2017-06-18 (×4): qty 1

## 2017-06-18 MED ORDER — BUPIVACAINE LIPOSOME 1.3 % IJ SUSP
INTRAMUSCULAR | Status: DC | PRN
  Administered 2017-06-18: 20 mL

## 2017-06-18 MED ORDER — SODIUM CHLORIDE 0.9 % IJ SOLN
INTRAMUSCULAR | Status: AC
  Filled 2017-06-18: qty 50

## 2017-06-18 MED ORDER — PREGABALIN 75 MG PO CAPS
75.0000 mg | ORAL_CAPSULE | Freq: Two times a day (BID) | ORAL | Status: DC
  Administered 2017-06-18 – 2017-06-21 (×7): 75 mg via ORAL
  Filled 2017-06-18 (×7): qty 1

## 2017-06-18 MED ORDER — ROCURONIUM BROMIDE 10 MG/ML (PF) SYRINGE
PREFILLED_SYRINGE | INTRAVENOUS | Status: DC | PRN
  Administered 2017-06-18: 50 mg via INTRAVENOUS

## 2017-06-18 MED ORDER — ONDANSETRON HCL 4 MG/2ML IJ SOLN: 4.0000 mg | Freq: Four times a day (QID) | INTRAMUSCULAR | Status: DC | PRN

## 2017-06-18 MED ORDER — ACETAMINOPHEN 325 MG PO TABS: 325.0000 mg | ORAL_TABLET | Freq: Four times a day (QID) | ORAL | 0 refills | Status: DC | PRN

## 2017-06-18 MED ORDER — FLEET ENEMA 7-19 GM/118ML RE ENEM: 1.0000 | ENEMA | Freq: Once | RECTAL | Status: DC | PRN

## 2017-06-18 MED ORDER — VENLAFAXINE HCL ER 150 MG PO CP24
150.0000 mg | ORAL_CAPSULE | Freq: Every day | ORAL | Status: DC
  Administered 2017-06-19 – 2017-06-21 (×3): 150 mg via ORAL
  Filled 2017-06-18 (×3): qty 1

## 2017-06-18 MED ORDER — MONTELUKAST SODIUM 10 MG PO TABS
10.0000 mg | ORAL_TABLET | Freq: Every day | ORAL | Status: DC
  Administered 2017-06-19 – 2017-06-21 (×3): 10 mg via ORAL
  Filled 2017-06-18 (×3): qty 1

## 2017-06-18 MED ORDER — LACTATED RINGERS IV SOLN
INTRAVENOUS | Status: DC
  Administered 2017-06-18 (×2): via INTRAVENOUS

## 2017-06-18 MED ORDER — VANCOMYCIN HCL IN DEXTROSE 1-5 GM/200ML-% IV SOLN
INTRAVENOUS | Status: AC
  Filled 2017-06-18: qty 200

## 2017-06-18 MED ORDER — TRAMADOL HCL 50 MG PO TABS
50.0000 mg | ORAL_TABLET | Freq: Four times a day (QID) | ORAL | Status: DC
  Administered 2017-06-18 – 2017-06-21 (×10): 50 mg via ORAL
  Filled 2017-06-18 (×11): qty 1

## 2017-06-18 MED ORDER — MIDAZOLAM HCL 5 MG/5ML IJ SOLN
INTRAMUSCULAR | Status: DC | PRN
  Administered 2017-06-18: 2 mg via INTRAVENOUS

## 2017-06-18 MED ORDER — VANCOMYCIN HCL IN DEXTROSE 1-5 GM/200ML-% IV SOLN
1000.0000 mg | INTRAVENOUS | Status: AC
  Administered 2017-06-18: 1000 mg via INTRAVENOUS
  Administered 2017-06-18: 200 mg via INTRAVENOUS

## 2017-06-18 MED ORDER — LACTATED RINGERS IV SOLN
INTRAVENOUS | Status: DC
  Administered 2017-06-18 – 2017-06-19 (×3): via INTRAVENOUS

## 2017-06-18 MED ORDER — RIVAROXABAN 10 MG PO TABS
10.0000 mg | ORAL_TABLET | Freq: Every day | ORAL | Status: DC
  Administered 2017-06-19 – 2017-06-21 (×3): 10 mg via ORAL
  Filled 2017-06-18 (×3): qty 1

## 2017-06-18 MED ORDER — DOCUSATE SODIUM 100 MG PO CAPS: 100.0000 mg | ORAL_CAPSULE | Freq: Two times a day (BID) | ORAL | 0 refills | Status: DC

## 2017-06-18 MED ORDER — ONDANSETRON HCL 4 MG PO TABS: 4.0000 mg | ORAL_TABLET | Freq: Four times a day (QID) | ORAL | Status: DC | PRN

## 2017-06-18 MED ORDER — HYDROMORPHONE HCL 1 MG/ML IJ SOLN
0.5000 mg | INTRAMUSCULAR | Status: DC | PRN
  Administered 2017-06-18 – 2017-06-19 (×4): 1 mg via INTRAVENOUS
  Filled 2017-06-18 (×4): qty 1

## 2017-06-18 MED ORDER — DEXAMETHASONE SODIUM PHOSPHATE 10 MG/ML IJ SOLN
INTRAMUSCULAR | Status: AC
  Filled 2017-06-18: qty 1

## 2017-06-18 MED ORDER — DOCUSATE SODIUM 100 MG PO CAPS
100.0000 mg | ORAL_CAPSULE | Freq: Two times a day (BID) | ORAL | Status: DC
  Administered 2017-06-18 – 2017-06-21 (×6): 100 mg via ORAL
  Filled 2017-06-18 (×6): qty 1

## 2017-06-18 MED ORDER — PROPOFOL 10 MG/ML IV BOLUS
INTRAVENOUS | Status: DC | PRN
  Administered 2017-06-18: 20 mg via INTRAVENOUS
  Administered 2017-06-18: 180 mg via INTRAVENOUS

## 2017-06-18 MED ORDER — LIDOCAINE 2% (20 MG/ML) 5 ML SYRINGE
INTRAMUSCULAR | Status: DC | PRN
  Administered 2017-06-18: 80 mg via INTRAVENOUS

## 2017-06-18 MED ORDER — MELATONIN 5 MG PO TABS
5.0000 mg | ORAL_TABLET | Freq: Every day | ORAL | Status: DC
  Administered 2017-06-18 – 2017-06-20 (×3): 5 mg via ORAL
  Filled 2017-06-18 (×3): qty 1

## 2017-06-18 MED ORDER — CHLORHEXIDINE GLUCONATE 4 % EX LIQD: 60.0000 mL | Freq: Once | CUTANEOUS | Status: DC

## 2017-06-18 MED ORDER — TRANEXAMIC ACID 1000 MG/10ML IV SOLN
1000.0000 mg | INTRAVENOUS | Status: AC
  Administered 2017-06-18: 1000 mg via INTRAVENOUS
  Filled 2017-06-18: qty 1100

## 2017-06-18 MED ORDER — FENTANYL CITRATE (PF) 100 MCG/2ML IJ SOLN
25.0000 ug | INTRAMUSCULAR | Status: DC | PRN
  Administered 2017-06-18 (×3): 50 ug via INTRAVENOUS

## 2017-06-18 MED ORDER — ALBUTEROL SULFATE HFA 108 (90 BASE) MCG/ACT IN AERS
INHALATION_SPRAY | RESPIRATORY_TRACT | Status: DC | PRN
  Administered 2017-06-18: 4 via RESPIRATORY_TRACT

## 2017-06-18 MED ORDER — OXYCODONE HCL 5 MG PO TABS: 5.0000 mg | ORAL_TABLET | Freq: Four times a day (QID) | ORAL | 0 refills | Status: DC | PRN

## 2017-06-18 MED ORDER — ALBUTEROL SULFATE HFA 108 (90 BASE) MCG/ACT IN AERS
INHALATION_SPRAY | RESPIRATORY_TRACT | Status: AC
  Filled 2017-06-18: qty 6.7

## 2017-06-18 MED ORDER — DEXAMETHASONE SODIUM PHOSPHATE 10 MG/ML IJ SOLN
INTRAMUSCULAR | Status: DC | PRN
  Administered 2017-06-18: 10 mg via INTRAVENOUS

## 2017-06-18 MED ORDER — IPRATROPIUM BROMIDE 0.02 % IN SOLN
0.5000 mg | Freq: Four times a day (QID) | RESPIRATORY_TRACT | Status: DC
  Administered 2017-06-19 – 2017-06-21 (×9): 0.5 mg via RESPIRATORY_TRACT
  Filled 2017-06-18 (×10): qty 2.5

## 2017-06-18 MED ORDER — FERROUS SULFATE 325 (65 FE) MG PO TABS
325.0000 mg | ORAL_TABLET | Freq: Three times a day (TID) | ORAL | Status: DC
  Administered 2017-06-18 – 2017-06-21 (×8): 325 mg via ORAL
  Filled 2017-06-18 (×9): qty 1

## 2017-06-18 MED ORDER — METHOCARBAMOL 1000 MG/10ML IJ SOLN
500.0000 mg | Freq: Four times a day (QID) | INTRAVENOUS | Status: DC | PRN
  Administered 2017-06-18: 500 mg via INTRAVENOUS
  Filled 2017-06-18: qty 550

## 2017-06-18 MED ORDER — SODIUM CHLORIDE 0.9 % IV SOLN
INTRAVENOUS | Status: AC
  Filled 2017-06-18: qty 500000

## 2017-06-18 MED ORDER — PROPOFOL 10 MG/ML IV BOLUS
INTRAVENOUS | Status: AC
  Filled 2017-06-18: qty 20

## 2017-06-18 MED ORDER — PANTOPRAZOLE SODIUM 40 MG PO TBEC
40.0000 mg | DELAYED_RELEASE_TABLET | Freq: Two times a day (BID) | ORAL | Status: DC
  Administered 2017-06-18 – 2017-06-21 (×6): 40 mg via ORAL
  Filled 2017-06-18 (×6): qty 1

## 2017-06-18 MED ORDER — METOCLOPRAMIDE HCL 5 MG/ML IJ SOLN: 5.0000 mg | Freq: Three times a day (TID) | INTRAMUSCULAR | Status: DC | PRN

## 2017-06-18 MED ORDER — ONDANSETRON HCL 4 MG/2ML IJ SOLN
INTRAMUSCULAR | Status: AC
  Filled 2017-06-18: qty 2

## 2017-06-18 MED ORDER — ALUM & MAG HYDROXIDE-SIMETH 200-200-20 MG/5ML PO SUSP: 30.0000 mL | ORAL | Status: DC | PRN

## 2017-06-18 MED ORDER — MIDAZOLAM HCL 2 MG/2ML IJ SOLN
INTRAMUSCULAR | Status: AC
  Filled 2017-06-18: qty 2

## 2017-06-18 MED ORDER — ONDANSETRON HCL 4 MG/2ML IJ SOLN
INTRAMUSCULAR | Status: DC | PRN
  Administered 2017-06-18: 4 mg via INTRAVENOUS

## 2017-06-18 MED ORDER — BUPIVACAINE LIPOSOME 1.3 % IJ SUSP
20.0000 mL | Freq: Once | INTRAMUSCULAR | Status: AC
  Filled 2017-06-18: qty 20

## 2017-06-18 MED ORDER — LEVOTHYROXINE SODIUM 25 MCG PO TABS
25.0000 ug | ORAL_TABLET | Freq: Every day | ORAL | Status: DC
  Administered 2017-06-19 – 2017-06-21 (×3): 25 ug via ORAL
  Filled 2017-06-18 (×3): qty 1

## 2017-06-18 MED ORDER — BACLOFEN 10 MG PO TABS
10.0000 mg | ORAL_TABLET | Freq: Every day | ORAL | Status: DC
  Administered 2017-06-18 – 2017-06-20 (×3): 10 mg via ORAL
  Filled 2017-06-18 (×3): qty 1

## 2017-06-18 MED ORDER — THROMBIN (RECOMBINANT) 5000 UNITS EX SOLR
CUTANEOUS | Status: DC | PRN
  Administered 2017-06-18: 5000 [IU] via TOPICAL

## 2017-06-18 MED ORDER — ATENOLOL 25 MG PO TABS
25.0000 mg | ORAL_TABLET | Freq: Every day | ORAL | Status: DC
  Administered 2017-06-19 – 2017-06-21 (×3): 25 mg via ORAL
  Filled 2017-06-18 (×3): qty 1

## 2017-06-18 MED ORDER — TRAMADOL HCL 50 MG PO TABS: 50.0000 mg | ORAL_TABLET | Freq: Four times a day (QID) | ORAL | 0 refills | Status: DC | PRN

## 2017-06-18 MED ORDER — POLYETHYLENE GLYCOL 3350 17 G PO PACK
17.0000 g | PACK | Freq: Every day | ORAL | Status: DC | PRN
  Administered 2017-06-20: 17 g via ORAL
  Filled 2017-06-18: qty 1

## 2017-06-18 MED ORDER — METOCLOPRAMIDE HCL 5 MG PO TABS: 5.0000 mg | ORAL_TABLET | Freq: Three times a day (TID) | ORAL | Status: DC | PRN

## 2017-06-18 MED ORDER — OXYCODONE HCL 5 MG PO TABS
10.0000 mg | ORAL_TABLET | ORAL | Status: DC | PRN
  Administered 2017-06-18 – 2017-06-21 (×14): 15 mg via ORAL
  Filled 2017-06-18 (×14): qty 3

## 2017-06-18 MED ORDER — SODIUM CHLORIDE 0.9 % IV SOLN
INTRAVENOUS | Status: DC | PRN
  Administered 2017-06-18 (×2): 500 mL

## 2017-06-18 MED ORDER — VARENICLINE TARTRATE 1 MG PO TABS
1.0000 mg | ORAL_TABLET | Freq: Two times a day (BID) | ORAL | Status: DC
  Administered 2017-06-18 – 2017-06-21 (×6): 1 mg via ORAL
  Filled 2017-06-18 (×6): qty 1

## 2017-06-18 MED ORDER — MENTHOL 3 MG MT LOZG
1.0000 | LOZENGE | OROMUCOSAL | Status: DC | PRN
  Filled 2017-06-18: qty 9

## 2017-06-18 MED ORDER — FENTANYL CITRATE (PF) 250 MCG/5ML IJ SOLN
INTRAMUSCULAR | Status: DC | PRN
  Administered 2017-06-18: 50 ug via INTRAVENOUS
  Administered 2017-06-18: 100 ug via INTRAVENOUS
  Administered 2017-06-18 (×3): 50 ug via INTRAVENOUS

## 2017-06-18 MED ORDER — METHOCARBAMOL 500 MG PO TABS: 500.0000 mg | ORAL_TABLET | Freq: Four times a day (QID) | ORAL | 0 refills | Status: DC | PRN

## 2017-06-18 MED ORDER — VALACYCLOVIR HCL 500 MG PO TABS
500.0000 mg | ORAL_TABLET | Freq: Every day | ORAL | Status: DC
  Administered 2017-06-18 – 2017-06-21 (×4): 500 mg via ORAL
  Filled 2017-06-18 (×4): qty 1

## 2017-06-18 MED ORDER — IPRATROPIUM BROMIDE HFA 17 MCG/ACT IN AERS
2.0000 | INHALATION_SPRAY | Freq: Four times a day (QID) | RESPIRATORY_TRACT | Status: DC
Start: 2017-06-18 — End: 2017-06-18

## 2017-06-18 MED ORDER — PHENOL 1.4 % MT LIQD: 1.0000 | OROMUCOSAL | Status: DC | PRN

## 2017-06-18 SURGICAL SUPPLY — 64 items
BAG DECANTER FOR FLEXI CONT (MISCELLANEOUS) ×2 IMPLANT
BAG ZIPLOCK 12X15 (MISCELLANEOUS) ×2 IMPLANT
BLADE SAW SGTL 73X25 THK (BLADE) ×2 IMPLANT
CAPT HIP TOTAL 2 ×2 IMPLANT
COVER SURGICAL LIGHT HANDLE (MISCELLANEOUS) ×2 IMPLANT
DECANTER SPIKE VIAL GLASS SM (MISCELLANEOUS) ×2 IMPLANT
DERMABOND ADVANCED (GAUZE/BANDAGES/DRESSINGS)
DERMABOND ADVANCED .7 DNX12 (GAUZE/BANDAGES/DRESSINGS) IMPLANT
DRAPE INCISE IOBAN 85X60 (DRAPES) ×2 IMPLANT
DRAPE ORTHO SPLIT 77X108 STRL (DRAPES) ×2
DRAPE POUCH INSTRU U-SHP 10X18 (DRAPES) ×2 IMPLANT
DRAPE SURG 17X11 SM STRL (DRAPES) ×2 IMPLANT
DRAPE SURG ORHT 6 SPLT 77X108 (DRAPES) ×2 IMPLANT
DRAPE TOP 10253 STERILE (DRAPES) IMPLANT
DRAPE U-SHAPE 47X51 STRL (DRAPES) ×2 IMPLANT
DRSG ADAPTIC 3X8 NADH LF (GAUZE/BANDAGES/DRESSINGS) IMPLANT
DRSG AQUACEL AG ADV 3.5X10 (GAUZE/BANDAGES/DRESSINGS) IMPLANT
DRSG AQUACEL AG ADV 3.5X14 (GAUZE/BANDAGES/DRESSINGS) ×2 IMPLANT
DRSG TEGADERM 4X4.75 (GAUZE/BANDAGES/DRESSINGS) IMPLANT
DURAPREP 26ML APPLICATOR (WOUND CARE) ×2 IMPLANT
ELECT BLADE TIP CTD 4 INCH (ELECTRODE) ×2 IMPLANT
ELECT REM PT RETURN 15FT ADLT (MISCELLANEOUS) ×2 IMPLANT
EVACUATOR 1/8 PVC DRAIN (DRAIN) IMPLANT
FACESHIELD WRAPAROUND (MASK) ×10 IMPLANT
GAUZE SPONGE 2X2 8PLY STRL LF (GAUZE/BANDAGES/DRESSINGS) IMPLANT
GAUZE SPONGE 4X4 12PLY STRL (GAUZE/BANDAGES/DRESSINGS) IMPLANT
GLOVE BIOGEL PI IND STRL 6.5 (GLOVE) ×1 IMPLANT
GLOVE BIOGEL PI IND STRL 8.5 (GLOVE) ×1 IMPLANT
GLOVE BIOGEL PI INDICATOR 6.5 (GLOVE) ×1
GLOVE BIOGEL PI INDICATOR 8.5 (GLOVE) ×1
GLOVE ECLIPSE 8.0 STRL XLNG CF (GLOVE) ×4 IMPLANT
GLOVE SURG SS PI 6.5 STRL IVOR (GLOVE) ×2 IMPLANT
GOWN STRL REUS W/TWL LRG LVL3 (GOWN DISPOSABLE) ×2 IMPLANT
GOWN STRL REUS W/TWL XL LVL3 (GOWN DISPOSABLE) ×2 IMPLANT
HEMOSTAT SPONGE AVITENE ULTRA (HEMOSTASIS) ×2 IMPLANT
HOLDER FOLEY CATH W/STRAP (MISCELLANEOUS) ×2 IMPLANT
IMMOBILIZER KNEE 20 (SOFTGOODS) ×2 IMPLANT
IMMOBILIZER KNEE 20 THIGH 36 (SOFTGOODS) IMPLANT
KIT BASIN OR (CUSTOM PROCEDURE TRAY) ×2 IMPLANT
MANIFOLD NEPTUNE II (INSTRUMENTS) ×2 IMPLANT
MARKER SKIN DUAL TIP RULER LAB (MISCELLANEOUS) IMPLANT
NDL SAFETY ECLIPSE 18X1.5 (NEEDLE) ×1 IMPLANT
NEEDLE HYPO 18GX1.5 SHARP (NEEDLE) ×1
PACK TOTAL JOINT (CUSTOM PROCEDURE TRAY) ×2 IMPLANT
POSITIONER SURGICAL ARM (MISCELLANEOUS) ×4 IMPLANT
SPONGE GAUZE 2X2 STER 10/PKG (GAUZE/BANDAGES/DRESSINGS)
SPONGE LAP 18X18 RF (DISPOSABLE) IMPLANT
SPONGE LAP 4X18 RFD (DISPOSABLE) ×2 IMPLANT
STAPLER VISISTAT 35W (STAPLE) IMPLANT
STRIP CLOSURE SKIN 1/2X4 (GAUZE/BANDAGES/DRESSINGS) ×4 IMPLANT
SUCTION FRAZIER HANDLE 10FR (MISCELLANEOUS) ×1
SUCTION TUBE FRAZIER 10FR DISP (MISCELLANEOUS) ×1 IMPLANT
SUT MNCRL AB 4-0 PS2 18 (SUTURE) ×2 IMPLANT
SUT VIC AB 1 CT1 27 (SUTURE) ×1
SUT VIC AB 1 CT1 27XBRD ANTBC (SUTURE) ×1 IMPLANT
SUT VIC AB 2-0 CT1 27 (SUTURE) ×3
SUT VIC AB 2-0 CT1 TAPERPNT 27 (SUTURE) ×3 IMPLANT
SUT VLOC 180 0 24IN GS25 (SUTURE) ×2 IMPLANT
SYR 20CC LL (SYRINGE) ×2 IMPLANT
TOWEL OR 17X26 10 PK STRL BLUE (TOWEL DISPOSABLE) ×4 IMPLANT
TRAY FOLEY CATH 14FR (SET/KITS/TRAYS/PACK) ×2 IMPLANT
TRAY FOLEY W/METER SILVER 16FR (SET/KITS/TRAYS/PACK) IMPLANT
WATER STERILE IRR 1000ML POUR (IV SOLUTION) ×4 IMPLANT
YANKAUER SUCT BULB TIP 10FT TU (MISCELLANEOUS) ×2 IMPLANT

## 2017-06-18 NOTE — Anesthesia Postprocedure Evaluation (Signed)
Anesthesia Post Note  Patient: Joan [name]  Procedure(s) Performed: LEFT TOTAL HIP ARTHROPLASTY (Left Hip)     Patient location during evaluation: PACU Anesthesia Type: General Level of consciousness: awake Pain management: pain level controlled Vital Signs Assessment: post-procedure vital signs reviewed and stable Respiratory status: spontaneous breathing Cardiovascular status: stable Anesthetic complications: no    Last Vitals:  Vitals:   06/18/17 1426 06/18/17 1431  BP:  134/82  Pulse:  75  Resp:  16  Temp:  (!) 36.4 C  SpO2: 93% 99%    Last Pain:  Vitals:   06/18/17 1431  TempSrc: Axillary  PainSc:                  [name]

## 2017-06-18 NOTE — Anesthesia Preprocedure Evaluation (Addendum)
Anesthesia Evaluation  Patient identified by MRN, date of birth, ID band Patient awake    Reviewed: Allergy & Precautions, NPO status , Patient's Chart, lab work & pertinent test results  Airway Mallampati: II  TM Distance: >3 FB     Dental   Pulmonary asthma , sleep apnea , pneumonia, COPD, former smoker,    breath sounds clear to auscultation       Cardiovascular hypertension,  Rhythm:Regular Rate:Normal     Neuro/Psych  Headaches,    GI/Hepatic GERD  ,(+) neg Cirrhosis      , Hepatitis -  Endo/Other  diabetesHypothyroidism   Renal/GU      Musculoskeletal   Abdominal   Peds  Hematology   Anesthesia Other Findings   Reproductive/Obstetrics                             Anesthesia Physical Anesthesia Plan  ASA: III  Anesthesia Plan: General   Post-op Pain Management:    Induction: Intravenous  PONV Risk Score and Plan: Treatment may vary due to age or medical condition, Ondansetron, Dexamethasone and Midazolam  Airway Management Planned: Oral ETT  Additional Equipment:   Intra-op Plan:   Post-operative Plan: Extubation in OR  Informed Consent:   Dental advisory given  Plan Discussed with: CRNA and Anesthesiologist  Anesthesia Plan Comments:       Anesthesia Quick Evaluation

## 2017-06-18 NOTE — Transfer of Care (Signed)
Immediate Anesthesia Transfer of Care Note  Patient: Joan [name]  Procedure(s) Performed: LEFT TOTAL HIP ARTHROPLASTY (Left Hip)  Patient Location: PACU  Anesthesia Type:General  Level of Consciousness: awake, drowsy and patient cooperative  Airway & Oxygen Therapy: Patient Spontanous Breathing and Patient connected to face mask oxygen  Post-op Assessment: Report given to RN, Post -op Vital signs reviewed and stable and Patient moving all extremities  Post vital signs: Reviewed and stable  Last Vitals:  Vitals Value Taken Time  BP 177/106 06/18/2017 10:17 AM  Temp    Pulse 71 06/18/2017 10:18 AM  Resp 13 06/18/2017 10:18 AM  SpO2 100 % 06/18/2017 10:18 AM  Vitals shown include unvalidated device data.  Last Pain:  Vitals:   06/18/17 0734  TempSrc:   PainSc: 0-No pain      Patients Stated Pain Goal: 4 (15/72/62 0355)  Complications: No apparent anesthesia complications

## 2017-06-18 NOTE — Discharge Instructions (Addendum)
Dr. [name] [facility] [address] [place], [place] [address] [phone]  TOTAL KNEE REPLACEMENT POSTOPERATIVE DIRECTIONS  Knee Rehabilitation, Guidelines Following Surgery  Results after knee surgery are often greatly improved when you follow the exercise, range of motion and muscle strengthening exercises prescribed by your doctor. Safety measures are also important to protect the knee from further injury. Any time any of these exercises cause you to have increased pain or swelling in your knee joint, decrease the amount until you are comfortable again and slowly increase them. If you have problems or questions, call your caregiver or physical therapist for advice.   HOME CARE INSTRUCTIONS  Remove items at home which could result in a fall. This includes throw rugs or furniture in walking pathways.   ICE to the affected knee every three hours for 30 minutes at a time and then as needed for pain and swelling.  Continue to use ice on the knee for pain and swelling from surgery. You may notice swelling that will progress down to the foot and ankle.  This is normal after surgery.  Elevate the leg when you are not up walking on it.    Continue to use the breathing machine which will help keep your temperature down.  It is common for your temperature to cycle up and down following surgery, especially at night when you are not up moving around and exerting yourself.  The breathing machine keeps your lungs expanded and your temperature down.  Do not place pillow under knee, focus on keeping the knee straight while resting  DIET You may resume your previous home diet once your are discharged from the hospital.  DRESSING / WOUND CARE / SHOWERING The dressing is waterproof and will remain in until the follow up appointment.   ACTIVITY Walk with your walker as instructed. Use walker as long as suggested by your caregivers. Avoid periods of inactivity such as sitting  longer than an hour when not asleep. This helps prevent blood clots.  You may resume a sexual relationship in one month or when given the OK by your doctor.  You may return to work once you are cleared by your doctor.  Do not drive a car for 6 weeks or until released by you surgeon.  Do not drive while taking narcotics.  WEIGHT BEARING Weight bearing as tolerated with assist device (walker, cane, etc) as directed, use it as long as suggested by your surgeon or therapist, typically at least 4-6 weeks.  POSTOPERATIVE CONSTIPATION PROTOCOL Constipation - defined medically as fewer than three stools per week and severe constipation as less than one stool per week.  One of the most common issues patients have following surgery is constipation.  Even if you have a regular bowel pattern at home, your normal regimen is likely to be disrupted due to multiple reasons following surgery.  Combination of anesthesia, postoperative narcotics, change in appetite and fluid intake all can affect your bowels.  In order to avoid complications following surgery, here are some recommendations in order to help you during your recovery period.  Colace (docusate) - Pick up an over-the-counter form of Colace or another stool softener and take twice a day as long as you are requiring postoperative pain medications.  Take with a full glass of water daily.  If you experience loose stools or diarrhea, hold the colace until you stool forms back up.  If your symptoms do not get better within 1 week or if  they get worse, check with your doctor.  Dulcolax (bisacodyl) - Pick up over-the-counter and take as directed by the product packaging as needed to assist with the movement of your bowels.  Take with a full glass of water.  Use this product as needed if not relieved by Colace only.   MiraLax (polyethylene glycol) - Pick up over-the-counter to have on hand.  MiraLax is a solution that will increase the amount of water in your  bowels to assist with bowel movements.  Take as directed and can mix with a glass of water, juice, soda, coffee, or tea.  Take if you go more than two days without a movement. Do not use MiraLax more than once per day. Call your doctor if you are still constipated or irregular after using this medication for 7 days in a row.  If you continue to have problems with postoperative constipation, please contact the office for further assistance and recommendations.  If you experience "the worst abdominal pain ever" or develop nausea or vomiting, please contact the office immediatly for further recommendations for treatment.  ITCHING  If you experience itching with your medications, try taking only a single pain pill, or even half a pain pill at a time.  You can also use Benadryl over the counter for itching or also to help with sleep.   TED HOSE STOCKINGS Wear the elastic stockings on both legs for three weeks following surgery during the day but you may remove then at night for sleeping.  MEDICATIONS See your medication summary on the After Visit Summary that the nursing staff will review with you prior to discharge.  You may have some home medications which will be placed on hold until you complete the course of blood thinner medication.  It is important for you to complete the blood thinner medication as prescribed by your surgeon.  Continue your approved medications as instructed at time of discharge.  PRECAUTIONS If you experience chest pain or shortness of breath - call 911 immediately for transfer to the hospital emergency department.  If you develop a fever greater that 101 F, purulent drainage from wound, increased redness or drainage from wound, foul odor from the wound/dressing, or calf pain - CONTACT YOUR SURGEON.                                                   FOLLOW-UP APPOINTMENTS Make sure you keep all of your appointments after your operation with your surgeon and caregivers. You  should call the office at the above phone number and make an appointment for approximately two weeks after the date of your surgery or on the date instructed by your surgeon outlined in the "After Visit Summary".   RANGE OF MOTION AND STRENGTHENING EXERCISES  Rehabilitation of the knee is important following a knee injury or an operation. After just a few days of immobilization, the muscles of the thigh which control the knee become weakened and shrink (atrophy). Knee exercises are designed to build up the tone and strength of the thigh muscles and to improve knee motion. Often times heat used for twenty to thirty minutes before working out will loosen up your tissues and help with improving the range of motion but do not use heat for the first two weeks following surgery. These exercises can be done on a training (  exercise) mat, on the floor, on a table or on a bed. Use what ever works the best and is most comfortable for you Knee exercises include:  Leg Lifts - While your knee is still immobilized in a splint or cast, you can do straight leg raises. Lift the leg to 60 degrees, hold for 3 sec, and slowly lower the leg. Repeat 10-20 times 2-3 times daily. Perform this exercise against resistance later as your knee gets better.  Quad and Hamstring Sets - Tighten up the muscle on the front of the thigh (Quad) and hold for 5-10 sec. Repeat this 10-20 times hourly. Hamstring sets are done by pushing the foot backward against an object and holding for 5-10 sec. Repeat as with quad sets.   Leg Slides: Lying on your back, slowly slide your foot toward your buttocks, bending your knee up off the floor (only go as far as is comfortable). Then slowly slide your foot back down until your leg is flat on the floor again.  Angel Wings: Lying on your back spread your legs to the side as far apart as you can without causing discomfort.  A rehabilitation program following serious knee injuries can speed recovery and  prevent re-injury in the future due to weakened muscles. Contact your doctor or a physical therapist for more information on knee rehabilitation.   IF YOU ARE TRANSFERRED TO A SKILLED REHAB FACILITY If the patient is transferred to a skilled rehab facility following release from the hospital, a list of the current medications will be sent to the facility for the patient to continue.  When discharged from the skilled rehab facility, please have the facility set up the patient's [place] prior to being released. Also, the skilled facility will be responsible for providing the patient with their medications at time of release from the facility to include their pain medication, the muscle relaxants, and their blood thinner medication. If the patient is still at the rehab facility at time of the two week follow up appointment, the skilled rehab facility will also need to assist the patient in arranging follow up appointment in our office and any transportation needs.  MAKE SURE YOU:  Understand these instructions.  Get help right away if you are not doing well or get worse.    Pick up stool softner and laxative for home use following surgery while on pain medications. Do not submerge incision under water. Please use good hand washing techniques while changing dressing each day. May shower starting three days after surgery. Please use a clean towel to pat the incision dry following showers. Continue to use ice for pain and swelling after surgery. Do not use any lotions or creams on the incision until instructed by your surgeon.

## 2017-06-18 NOTE — Anesthesia Procedure Notes (Signed)
Procedure Name: Intubation Date/Time: 06/18/2017 8:08 AM Performed by: Mitzie Na, CRNA Pre-anesthesia Checklist: Patient identified, Suction available, Emergency Drugs available, Patient being monitored and Timeout performed Patient Re-evaluated:Patient Re-evaluated prior to induction Oxygen Delivery Method: Circle system utilized Preoxygenation: Pre-oxygenation with 100% oxygen Induction Type: IV induction Laryngoscope Size: Mac and 3 Grade View: Grade I Tube type: Oral Tube size: 7.0 mm Number of attempts: 1 Airway Equipment and Method: Stylet Dental Injury: Teeth and Oropharynx as per pre-operative assessment

## 2017-06-18 NOTE — Interval H&P Note (Signed)
History and Physical Interval Note:  06/18/2017 7:25 AM  Joan [name]  has presented today for surgery, with the diagnosis of Left hip osteoarthrtiis  The various methods of treatment have been discussed with the patient and family. After consideration of risks, benefits and other options for treatment, the patient has consented to  Procedure(s): LEFT TOTAL HIP ARTHROPLASTY (Left) as a surgical intervention .  The patient's history has been reviewed, patient examined, no change in status, stable for surgery.  I have reviewed the patient's chart and labs.  Questions were answered to the patient's satisfaction.     [name]

## 2017-06-18 NOTE — Brief Op Note (Signed)
06/18/2017  9:40 AM  PATIENT:  Joan [name]  57 y.o. female  PRE-OPERATIVE DIAGNOSIS:  Left hip Primary  osteoarthrtiis.Obesity  POST-OPERATIVE DIAGNOSIS:  Left hip Primary osteoarthrtiis.Obesity  PROCEDURE:  Procedure(s): LEFT TOTAL HIP ARTHROPLASTY (Left)  SURGEON:  Surgeon(s) and Role:    [name], MD - Primary  PHYSICIAN ASSISTANT: Ardeen Jourdain PA  ASSISTANTS: Ardeen Jourdain PA  ANESTHESIA:   general  EBL:  600 mL   BLOOD ADMINISTERED:none  DRAINS: none   LOCAL MEDICATIONS USED:  OTHER 20cc of Exparel mixed with 20cc of Normal Saline.  SPECIMEN:  No Specimen  DISPOSITION OF SPECIMEN:  N/A  COUNTS:  YES  TOURNIQUET:  * No tourniquets in log *  DICTATION: .Other Dictation: Dictation Number [id number]  PLAN OF CARE: Admit to inpatient   PATIENT DISPOSITION:  Stable in the OR   Delay start of Pharmacological VTE agent (>24hrs) due to surgical blood loss or risk of bleeding: yes

## 2017-06-19 LAB — GLUCOSE, CAPILLARY
Glucose-Capillary: 124 mg/dL — ABNORMAL HIGH (ref 65–99)
Glucose-Capillary: 236 mg/dL — ABNORMAL HIGH (ref 65–99)
Glucose-Capillary: 112 mg/dL — ABNORMAL HIGH (ref 65–99)
Glucose-Capillary: 141 mg/dL — ABNORMAL HIGH (ref 65–99)

## 2017-06-19 LAB — CBC
HCT: 31.7 % — ABNORMAL LOW (ref 36.0–46.0)
Hemoglobin: 10.3 g/dL — ABNORMAL LOW (ref 12.0–15.0)
MCH: 30.8 pg (ref 26.0–34.0)
MCHC: 32.5 g/dL (ref 30.0–36.0)
MCV: 94.9 fL (ref 78.0–100.0)
Platelets: 281 10*3/uL (ref 150–400)
RBC: 3.34 MIL/uL — ABNORMAL LOW (ref 3.87–5.11)
RDW: 13.5 % (ref 11.5–15.5)
WBC: 15.9 10*3/uL — ABNORMAL HIGH (ref 4.0–10.5)

## 2017-06-19 LAB — BASIC METABOLIC PANEL
Anion gap: 10 (ref 5–15)
BUN: 16 mg/dL (ref 6–20)
CO2: 26 mmol/L (ref 22–32)
Calcium: 8.5 mg/dL — ABNORMAL LOW (ref 8.9–10.3)
Chloride: 105 mmol/L (ref 101–111)
Creatinine, Ser: 1 mg/dL (ref 0.44–1.00)
GFR calc Af Amer: >60 mL/min (ref >60)
GFR calc non Af Amer: >60 mL/min (ref >60)
Glucose, Bld: 134 mg/dL — ABNORMAL HIGH (ref 65–99)
Potassium: 3.7 mmol/L (ref 3.5–5.1)
Sodium: 141 mmol/L (ref 135–145)

## 2017-06-19 MED ORDER — MORPHINE SULFATE (PF) 2 MG/ML IV SOLN
1.0000 mg | INTRAVENOUS | Status: DC | PRN
  Administered 2017-06-19: 2 mg via INTRAVENOUS
  Filled 2017-06-19: qty 1

## 2017-06-19 MED ORDER — LIP MEDEX EX OINT
TOPICAL_OINTMENT | CUTANEOUS | Status: AC
  Filled 2017-06-19: qty 7

## 2017-06-19 MED ORDER — ALPRAZOLAM 0.5 MG PO TABS
0.5000 mg | ORAL_TABLET | Freq: Two times a day (BID) | ORAL | Status: DC | PRN
  Administered 2017-06-19 – 2017-06-21 (×4): 0.5 mg via ORAL
  Filled 2017-06-19 (×4): qty 1

## 2017-06-19 MED ORDER — INSULIN ASPART 100 UNIT/ML ~~LOC~~ SOLN
0.0000 [IU] | Freq: Three times a day (TID) | SUBCUTANEOUS | Status: DC
  Administered 2017-06-19: 5 [IU] via SUBCUTANEOUS
  Administered 2017-06-20: 3 [IU] via SUBCUTANEOUS
  Administered 2017-06-20 (×2): 2 [IU] via SUBCUTANEOUS
  Administered 2017-06-21: 3 [IU] via SUBCUTANEOUS

## 2017-06-19 MED ORDER — DIPHENHYDRAMINE HCL 25 MG PO CAPS
25.0000 mg | ORAL_CAPSULE | Freq: Four times a day (QID) | ORAL | Status: DC | PRN
  Administered 2017-06-19: 25 mg via ORAL
  Filled 2017-06-19: qty 1

## 2017-06-19 MED ORDER — MORPHINE SULFATE (PF) 2 MG/ML IV SOLN
1.0000 mg | INTRAVENOUS | Status: DC | PRN
  Administered 2017-06-19 (×2): 1 mg via INTRAVENOUS
  Filled 2017-06-19 (×2): qty 1

## 2017-06-19 NOTE — Progress Notes (Signed)
Physical Therapy Treatment Patient Details Name: Joan [name] MRN: [id number] DOB: 05/04/1960 Today's Date: 06/19/2017    History of Present Illness L THA posterior,R THA 4 months ago    PT Comments    The patient requires a lot of time for repositioning, getting needs met. Worked on exercises and provided leg lifter for patient to self  Position left leg.  Follow Up Recommendations  SNF     Equipment Recommendations  None recommended by PT    Recommendations for Other Services       Precautions / Restrictions Precautions Precautions: Posterior Hip Restrictions LLE Weight Bearing: Partial weight bearing LLE Partial Weight Bearing Percentage or Pounds: 50%    Mobility  Bed Mobility          Ambulation/Gait                 Stairs             Wheelchair Mobility    Modified Rankin (Stroke Patients Only)       Balance Overall balance assessment: Needs assistance Sitting-balance support: Bilateral upper extremity supported;Feet supported Sitting balance-Leahy Scale: Good                                      Cognition Arousal/Alertness: Awake/alert Behavior During Therapy: Restless;Anxious Overall Cognitive Status: Within Functional Limits for tasks assessed                                        Exercises Total Joint Exercises Ankle Circles/Pumps: AROM;10 reps;Both Short Arc Quad: AROM;Both;10 reps Heel Slides: AROM;AAROM;Both;10 reps Hip ABduction/ADduction: AAROM;AROM;Both;10 reps    General Comments        Pertinent Vitals/Pain Pain Assessment: 0-10 Pain Score: 6  Pain Location: left hip Pain Descriptors / Indicators: Aching;Discomfort;Grimacing Pain Intervention(s): Limited activity within patient's tolerance;Premedicated before session    Home Living Family/patient expects to be discharged to:: Skilled nursing facility Living Arrangements: Other relatives Available Help at Discharge:  Family;Available PRN/intermittently Type of Home: House Home Access: Stairs to enter   Home Layout: One level Home Equipment: Environmental consultant - 4 wheels;Wheelchair - Sport and exercise psychologist Comments: info from previous encounter    Prior Function Level of Independence: Independent with assistive device(s)          PT Goals (current goals can now be found in the care plan section) Acute Rehab PT Goals Patient Stated Goal: to go to  skilled PT Goal Formulation: With patient Time For Goal Achievement: 06/26/17 Potential to Achieve Goals: Good Progress towards PT goals: Progressing toward goals    Frequency    7X/week      PT Plan Current plan remains appropriate    Co-evaluation              AM-PAC PT "6 Clicks" Daily Activity  Outcome Measure  Difficulty turning over in bed (including adjusting bedclothes, sheets and blankets)?: Unable Difficulty moving from lying on back to sitting on the side of the bed? : Unable Difficulty sitting down on and standing up from a chair with arms (e.g., wheelchair, bedside commode, etc,.)?: Unable Help needed moving to and from a bed to chair (including a wheelchair)?: Total Help needed walking in hospital room?: Total Help needed climbing 3-5 steps with a railing? : Total 6 Click Score: 6  End of Session Equipment Utilized During Treatment: Gait belt Activity Tolerance: Patient tolerated treatment well Patient left: in bed;with call bell/phone within reach Nurse Communication: Mobility status PT Visit Diagnosis: Unsteadiness on feet (R26.81);Pain Pain - Right/Left: Left Pain - part of body: Hip     Time: 1435-1535 PT Time Calculation (min) (ACUTE ONLY): 60 min  Charges:  $Therapeutic Exercise: 23-37 mins $Therapeutic Activity: 8-22 mins $Self Care/Home Management: 23-37                    G CodesTresa [name] PT [phone]    Claretha Cooper 06/19/2017, 3:50 PM

## 2017-06-19 NOTE — Clinical Social Work Note (Signed)
Clinical Social Work Assessment  Patient Details  Name: Joan [name] MRN: [id number] Date of Birth: 08/02/1960  Date of referral:  06/19/17               Reason for consult:  Facility Placement, Discharge Planning                Permission sought to share information with:  Family Supports Permission granted to share information::  Yes, Verbal Permission Granted  Name::        Agency::  SNF short rehab   Relationship::  Brother   Contact Information:     Housing/Transportation Living arrangements for the past 2 months:  Single Family Home Source of Information:  Patient Patient Interpreter Needed:  None Criminal Activity/Legal Involvement Pertinent to Current Situation/Hospitalization:  No - Comment as needed Significant Relationships:  None Lives with:    Do you feel safe going back to the place where you live?  Yes Need for family participation in patient care:  Yes (Dependent with mobility)  Care giving concerns:   SNF placement for short rehab.   Social Worker assessment / plan:  CSW met with the patient at bedside, explained role and reason for visit- to assist with the patient discharge to SNF. Patient reports she arranged to go to a SNF for rehab before returning home with her brother.   Patient reports discharge to [facility] and rehab SNF about four months ago for short rehab. Patient prefers to return due to her familiarity. CSW explain [employer] process and it taking up to 48 hours before authorization is given. Patient reports understanding but does not feel she is able pay privately if authorization is not received in time of her discharge.   CSW contacted facility liaison to initiate the authorization.  Patient reports she uses a Cpap machine, and will bring the CPAP to the SNF.   Plan: SNF  Employment status:  Disabled (Comment on whether or not currently receiving Disability) Insurance information:  Managed Medicare PT Recommendations:   [place] / Referral to community resources:  [place]  Patient/Family's Response to care: Agreeable and Responding well to care.   Patient/Family's Understanding of and Emotional Response to Diagnosis, Current Treatment, and Prognosis: Patient has good understanding of her diagnosis as evidenced by her ability to explain her surgical procedure and medical history.   Emotional Assessment Appearance:  Appears stated age Attitude/Demeanor/Rapport:    Affect (typically observed):  Accepting, Calm Orientation:  Oriented to Self, Oriented to Place, Oriented to  Time, Oriented to Situation Alcohol / Substance use:  Not Applicable Psych involvement (Current and /or in the community):  No (Comment)  Discharge Needs  Concerns to be addressed:  Discharge Planning Concerns Readmission within the last 30 days:  No Current discharge risk:  Dependent with Mobility Barriers to Discharge:  Ship broker, Continued Medical Work up   [employer], LCSW 06/19/2017, 1:32 PM

## 2017-06-19 NOTE — Telephone Encounter (Signed)
Appears nothing further needed Will sign off

## 2017-06-19 NOTE — Progress Notes (Signed)
Subjective: 1 Day Post-Op Procedure(s) (LRB): LEFT TOTAL HIP ARTHROPLASTY (Left) Patient reports pain as 5 on 0-10 scale.Pain only in Left hip which I would expect.    Objective: Vital signs in last 24 hours: Temp:  [97.4 F (36.3 C)-98.4 F (36.9 C)] 98 F (36.7 C) (04/25 0400) Pulse Rate:  [68-99] 85 (04/25 0400) Resp:  [11-22] 19 (04/25 0400) BP: (122-180)/(60-109) 122/60 (04/25 0400) SpO2:  [93 %-100 %] 98 % (04/25 0400) Weight:  [99.3 kg (219 lb)] 99.3 kg (219 lb) (04/24 0734)  Intake/Output from previous day: 04/24 0701 - 04/25 0700 In: 4290 [P.O.:960; I.V.:2925; IV Piggyback:405] Out: 4100 [Urine:3500; Blood:600] Intake/Output this shift: No intake/output data recorded.  Recent Labs    06/19/17 0534  HGB 10.3*   Recent Labs    06/19/17 0534  WBC 15.9*  RBC 3.34*  HCT 31.7*  PLT 281   No results for input(s): NA, K, CL, CO2, BUN, CREATININE, GLUCOSE, CALCIUM in the last 72 hours. No results for input(s): LABPT, INR in the last 72 hours.  Dorsiflexion/Plantar flexion intact  Will Dc tomorrow. Assessment/Plan: 1 Day Post-Op Procedure(s) (LRB): LEFT TOTAL HIP ARTHROPLASTY (Left) Up with therapyWill get her up today and DC tomorrow.    Joan [name] 06/19/2017, 7:29 AM

## 2017-06-19 NOTE — Op Note (Signed)
NAMEDIANN, DEBES NO.:  [id number]  MEDICAL RECORD NO.:  [id number]  LOCATION:  PERIO                        FACILITY:  [facility]  PHYSICIAN:  Georges Lynch. Atilano Covelli, M.D.DATE OF BIRTH:  October 27, 1960  DATE OF PROCEDURE:  06/18/2017 DATE OF DISCHARGE:                              OPERATIVE REPORT   SURGEON:  Georges Lynch. Darrelyn Hillock, M.D.  OPERATIVE ASSISTANT:  Dimitri Ped, PA.  PREOPERATIVE DIAGNOSIS:  Severe primary osteoarthritis of the left hip. She had a previous right total hip in the past.  POSTOPERATIVE DIAGNOSES:  Severe primary osteoarthritis of the left hip. She had a previous right total hip in the past.  OPERATION:  Left total hip arthroplasty utilizing the DePuy system.  I utilized a size 4 Tri-Lock high-offset stem, the neck length was a +5, the ball diameter was 36.  The Gription cup was a size 50 and I utilized one screw for fixation and we utilized the polyethylene insert.  DESCRIPTION OF PROCEDURE:  Under general anesthesia with the patient on the right side, left side up.  The routine orthopedic prep and draping of the left hip was carried out.  The appropriate time-out was carried out.  Prior to that, I marked the left hip in the holding area.  At this time, the patient had 1 g of vancomycin.  She also had TXA.  At this time, a posterolateral approach to the hip was carried out, bleeders were identified and cauterized.  Self-retaining tractors were inserted. At this particular point, I then advanced the retractors.  I identified the iliotibial band.  I incised the band.  I then partially detached the external rotators in usual fashion.  Following that, I did a capsulectomy, dislocated the head, and amputated the femoral head in usual fashion with the oscillating saw.  I then utilized my box osteotome and widening reamers to open up the canal.  I then inserted the canal finder, and we were down in the canal very nicely.  I then rasped the canal up  to a size 4 Tri-Lock stem.  After that, we thoroughly irrigated out the canal.  I then directed attention to the acetabulum.  I completed the capsulectomy and removed all the soft tissue from the acetabulum.  We then reamed the acetabulum up to size 49 for size 50 Gription cup that was inserted in usual fashion.  I then utilized the Charnley guide to make sure we had the proper alignment and we did.  I then utilized a drill hole through the hole in the cup and inserted one 25-mm length screw.  I then checked over the acetabulum to make sure the screw was fixed into the bone.  Following that, I then irrigated out the wound in usual fashion.  I then inserted a hole eliminator.  Following that, I inserted my permanent polyethylene cup. We had excellent fixation.  I checked to make sure the cup was secured and it was.  We then directed attention to the femoral shaft.  I inserted my permanent high-offset size 4 Tri-Lock stem.  We then went through the trials again utilizing the trial ball and reduced the hip and had excellent function.  Leg lengths were equal.  She was short on this side initially and she had a prior total hip on the opposite side, so we tried to match this as best we could.  I then removed the trial ball and inserted my permanent ball at this time, 36-mm diameter, +5 ceramic ball.  We then reduced the hip again and had excellent motion and excellent stability and excellent leg lengths.  I thoroughly irrigated out the area. I then inserted some thrombin-soaked Gelfoam inferiorly into the cup.  I then closed the wound layers in usual fashion.  At the end of the procedure, we injected 20 cc of Exparel with 20 cc of saline.  Then, remaining part of the wound was closed in usual fashion.  She will be admitted overnight.          ______________________________ Georges Lynch. Darrelyn Hillock, M.D.     RAG/MEDQ  D:  06/18/2017  T:  06/19/2017  Job:  [id number]

## 2017-06-19 NOTE — Progress Notes (Signed)
Patient refused for catheter to be removed today secondary to pain, and anxiety. Educated patient about risk of infection.   Will notify MD/PA of refusal.

## 2017-06-19 NOTE — NC FL2 (Signed)
[place] LEVEL OF CARE SCREENING TOOL     IDENTIFICATION  Patient Name: Joan [name] Birthdate: 1960-07-04 Sex: female Admission Date (Current Location): 06/18/2017  [facility] and [place] Number:  Herbalist and Address:  [facility],  [place] [address], [place]      Provider Number: [id number]  Attending Physician Name and Address:  [name], MD  Relative Name and Phone Number:       Current Level of Care: Hospital Recommended Level of Care: [place] Prior Approval Number:    Date Approved/Denied:   PASRR Number:    Discharge Plan: SNF    Current Diagnoses: Patient Active Problem List   Diagnosis Date Noted  . H/O total hip arthroplasty, left 06/18/2017  . Prediabetes 04/05/2017  . Hypothyroidism due to acquired atrophy of thyroid 04/05/2017  . Primary osteoarthritis involving multiple joints 04/05/2017  . Incarcerated ventral hernia 04/04/2017  . Chronic constipation 02/10/2017  . Iron deficiency anemia due to dietary causes 02/10/2017  . Osteoarthritis 02/10/2017  . Dyslipidemia 02/10/2017  . H/O total hip arthroplasty, right 02/05/2017  . OSA treated with BiPAP 01/14/2017  . Pre-operative clearance 12/16/2016  . Chronic respiratory failure with hypoxia ([place]) 12/16/2016  . Essential hypertension 11/29/2016  . Back pain 11/29/2016  . Vocal cord dysfunction   . COPD, severe ([place]) 09/24/2016  . Anxiety 09/24/2016  . Steroid-induced diabetes ([place]) 09/24/2016    Orientation RESPIRATION BLADDER Height & Weight     Self, Time, Situation, Place  Normal Continent Weight: 219 lb (99.3 kg) Height:  5\' 8"  (172.7 cm)  BEHAVIORAL SYMPTOMS/MOOD NEUROLOGICAL BOWEL NUTRITION STATUS      Continent Diet(Carb Modified)  AMBULATORY STATUS COMMUNICATION OF NEEDS Skin   Extensive Assist Verbally Surgical wounds                       Personal Care Assistance Level of Assistance  Bathing, Dressing,  Feeding Bathing Assistance: Limited assistance Feeding assistance: Independent Dressing Assistance: Limited assistance     Functional Limitations Info  Sight, Hearing, Speech Sight Info: Adequate Hearing Info: Adequate Speech Info: Adequate    SPECIAL CARE FACTORS FREQUENCY  PT (By licensed PT)     PT Frequency: 7x/week OT Frequency: 7x/week            Contractures Contractures Info: Not present    Additional Factors Info  Code Status, Allergies, Psychotropic, Insulin Sliding Scale Code Status Info: Fullcode  Allergies Info: Allergies: Keflex Cephalexin, Prednisone, Shellfish Allergy, Tetracyclines & Related, Dilaudid Hydromorphone Hcl, Incruse Ellipta Umeclidinium Bromide   Insulin Sliding Scale Info: 0-15/ 3x's a  day with meals       Current Medications (06/19/2017):  This is the current hospital active medication list Current Facility-Administered Medications  Medication Dose Route Frequency Provider Last Rate Last Dose  . acetaminophen (TYLENOL) tablet 325-650 mg  325-650 mg Oral Q6H PRN [name], MD      . albuterol (PROVENTIL) (2.5 MG/3ML) 0.083% nebulizer solution 2.5 mg  2.5 mg Nebulization Q6H PRN [name], MD      . alum & mag hydroxide-simeth (MAALOX/MYLANTA) 200-200-20 MG/5ML suspension 30 mL  30 mL Oral Q4H PRN [name], MD      . atenolol (TENORMIN) tablet 25 mg  25 mg Oral Daily [name], MD   25 mg at 06/19/17 1011  . baclofen (LIORESAL) tablet 10 mg  10 mg Oral QHS [name], MD   10 mg at  06/18/17 2105  . bisacodyl (DULCOLAX) EC tablet 5 mg  5 mg Oral Daily PRN [name], MD      . diphenhydrAMINE (BENADRYL) capsule 25 mg  25 mg Oral Q6H PRN Nicholes Stairs, MD   25 mg at 06/19/17 0511  . docusate sodium (COLACE) capsule 100 mg  100 mg Oral BID [name], MD   100 mg at 06/19/17 1011  . ferrous sulfate tablet 325 mg  325 mg Oral TID Penelope Galas, MD   325 mg at 06/19/17 1010  . insulin aspart  (novoLOG) injection 0-15 Units  0-15 Units Subcutaneous TID WC [name], MD   5 Units at 06/19/17 1247  . ipratropium (ATROVENT) nebulizer solution 0.5 mg  0.5 mg Nebulization QID [name], MD   0.5 mg at 06/19/17 1148  . lactated ringers infusion   Intravenous Continuous [name], MD 100 mL/hr at 06/19/17 1013    . levothyroxine (SYNTHROID, LEVOTHROID) tablet 25 mcg  25 mcg Oral QAC breakfast [name], MD   25 mcg at 06/19/17 0800  . Melatonin TABS 5 mg  5 mg Oral QHS [name], MD   5 mg at 06/18/17 2105  . menthol-cetylpyridinium (CEPACOL) lozenge 3 mg  1 lozenge Oral PRN [name], MD       Or  . phenol (CHLORASEPTIC) mouth spray 1 spray  1 spray Mouth/Throat PRN [name], MD      . methocarbamol (ROBAXIN) tablet 500 mg  500 mg Oral Q6H PRN [name], MD   500 mg at 06/19/17 0801   Or  . methocarbamol (ROBAXIN) 500 mg in dextrose 5 % 50 mL IVPB  500 mg Intravenous Q6H PRN [name], MD   Stopped at 06/18/17 1130  . metoCLOPramide (REGLAN) tablet 5-10 mg  5-10 mg Oral Q8H PRN [name], MD       Or  . metoCLOPramide (REGLAN) injection 5-10 mg  5-10 mg Intravenous Q8H PRN [name], MD      . mometasone-formoterol (DULERA) 200-5 MCG/ACT inhaler 2 puff  2 puff Inhalation BID [name], MD   2 puff at 06/19/17 [phone]  . montelukast (SINGULAIR) tablet 10 mg  10 mg Oral Daily [name], MD   10 mg at 06/19/17 1011  . morphine 2 MG/ML injection 1 mg  1 mg Intravenous Q2H PRN Cecilio Asper, Amber, PA-C   1 mg at 06/19/17 1359  . ondansetron (ZOFRAN) tablet 4 mg  4 mg Oral Q6H PRN [name], MD       Or  . ondansetron (ZOFRAN) injection 4 mg  4 mg Intravenous Q6H PRN [name], MD      . oxyCODONE (Oxy IR/ROXICODONE) immediate release tablet 10-15 mg  10-15 mg Oral Q4H PRN [name], MD   15 mg at 06/19/17 0801  . oxyCODONE (Oxy IR/ROXICODONE) immediate release tablet 5-10 mg  5-10 mg Oral Q4H PRN [name], MD      . pantoprazole (PROTONIX) EC tablet 40 mg  40 mg Oral BID WC [name], MD   40 mg at 06/19/17 0800  . polyethylene glycol (MIRALAX / GLYCOLAX) packet 17 g  17 g Oral Daily PRN [name], MD      . pregabalin (LYRICA) capsule 75 mg  75 mg Oral BID [name], MD   75 mg at 06/19/17 1011  . rivaroxaban (XARELTO) tablet 10 mg  10 mg Oral Q breakfast [name], MD   10 mg at 06/19/17 0800  .  roflumilast (DALIRESP) tablet 500 mcg  500 mcg Oral Daily [name], MD   500 mcg at 06/19/17 1011  . sodium phosphate (FLEET) 7-19 GM/118ML enema 1 enema  1 enema Rectal Once PRN [name], MD      . traMADol Veatrice [place]bon) tablet 50 mg  50 mg Oral Q6H [name], MD   50 mg at 06/19/17 1246  . valACYclovir (VALTREX) tablet 500 mg  500 mg Oral Daily [name], MD   500 mg at 06/19/17 1011  . varenicline (CHANTIX) tablet 1 mg  1 mg Oral BID [name], MD   1 mg at 06/19/17 1011  . venlafaxine XR (EFFEXOR-XR) 24 hr capsule 150 mg  150 mg Oral Q breakfast [name], MD   150 mg at 06/19/17 0800     Discharge Medications: Please see discharge summary for a list of discharge medications.  Relevant Imaging Results:  Relevant Lab Results:   Additional Information SSN:[id number]  Lia Hopping, LCSW

## 2017-06-19 NOTE — Evaluation (Signed)
Physical Therapy Evaluation Patient Details Name: Joan [name] MRN: [id number] DOB: 16-Oct-1960 Today's Date: 06/19/2017   History of Present Illness  L THA posterior,R THA 4 months ago  Clinical Impression  The patient is mobilizing well with 2 assist. Patient plans to go to SNF [facility]. Pt admitted with above diagnosis. Pt currently with functional limitations due to the deficits listed below (see PT Problem List).  Pt will benefit from skilled PT to increase their independence and safety with mobility to allow discharge to the venue listed below.       Follow Up Recommendations SNF    Equipment Recommendations  None recommended by PT    Recommendations for Other Services       Precautions / Restrictions Precautions Precautions: Posterior Hip Restrictions LLE Weight Bearing: Partial weight bearing LLE Partial Weight Bearing Percentage or Pounds: 50%      Mobility  Bed Mobility Overal bed mobility: Needs Assistance Bed Mobility: Supine to Sit     Supine to sit: Mod assist     General bed mobility comments: assist with the left leg, able to move trunk to upright  Transfers Overall transfer level: Needs assistance Equipment used: Rolling walker (2 wheeled) Transfers: Sit to/from [facility] Sit to Stand: Mod assist;+2 physical assistance;+2 safety/equipment Stand pivot transfers: Mod assist;+2 physical assistance;+2 safety/equipment       General transfer comment: extra time, much encouragement to mobilize, cues for safety and posterior  hip precautions  Ambulation/Gait                Stairs            Wheelchair Mobility    Modified Rankin (Stroke Patients Only)       Balance Overall balance assessment: Needs assistance Sitting-balance support: Bilateral upper extremity supported;Feet supported Sitting balance-Leahy Scale: Good                                       Pertinent Vitals/Pain Pain Assessment:  0-10 Pain Score: 6  Pain Location: left hip Pain Descriptors / Indicators: Aching;Discomfort;Grimacing Pain Intervention(s): Limited activity within patient's tolerance;Premedicated before session    Home Living Family/patient expects to be discharged to:: Skilled nursing facility Living Arrangements: Other relatives Available Help at Discharge: Family;Available PRN/intermittently Type of Home: House Home Access: Stairs to enter   Entrance Stairs-Number of Steps: 8-10 Home Layout: One level Home Equipment: Walker - 4 wheels;Wheelchair - Sport and exercise psychologist Comments: info from previous encounter    Prior Function Level of Independence: Independent with assistive device(s)               Hand Dominance        Extremity/Trunk Assessment   Upper Extremity Assessment Upper Extremity Assessment: Overall WFL for tasks assessed    Lower Extremity Assessment Lower Extremity Assessment: LLE deficits/detail LLE Deficits / Details: requires assist for left leg, able to bear PWB     Cervical / Trunk Assessment Cervical / Trunk Assessment: Normal  Communication   Communication: No difficulties  Cognition Arousal/Alertness: Awake/alert Behavior During Therapy: WFL for tasks assessed/performed;Anxious Overall Cognitive Status: Within Functional Limits for tasks assessed                                        General Comments      Exercises  Assessment/Plan    PT Assessment Patient needs continued PT services  PT Problem List Decreased strength;Decreased range of motion;Decreased knowledge of use of DME;Decreased activity tolerance;Decreased safety awareness;Decreased knowledge of precautions;Decreased mobility;Pain       PT Treatment Interventions DME instruction;Therapeutic exercise;Gait training;Functional mobility training;Therapeutic activities;Patient/family education    PT Goals (Current goals can be found in the Care Plan section)   Acute Rehab PT Goals Patient Stated Goal: to go to  skilled PT Goal Formulation: With patient Time For Goal Achievement: 06/26/17 Potential to Achieve Goals: Good    Frequency 7X/week   Barriers to discharge Decreased caregiver support      Co-evaluation               AM-PAC PT "6 Clicks" Daily Activity  Outcome Measure Difficulty turning over in bed (including adjusting bedclothes, sheets and blankets)?: Unable Difficulty moving from lying on back to sitting on the side of the bed? : Unable Difficulty sitting down on and standing up from a chair with arms (e.g., wheelchair, bedside commode, etc,.)?: Unable Help needed moving to and from a bed to chair (including a wheelchair)?: Total Help needed walking in hospital room?: Total Help needed climbing 3-5 steps with a railing? : Total 6 Click Score: 6    End of Session Equipment Utilized During Treatment: Gait belt Activity Tolerance: Patient tolerated treatment well Patient left: in chair;with call bell/phone within reach;with chair alarm set Nurse Communication: Mobility status PT Visit Diagnosis: Unsteadiness on feet (R26.81);Pain Pain - Right/Left: Left Pain - part of body: Hip    Time: 0931-0959 PT Time Calculation (min) (ACUTE ONLY): 28 min   Charges:   PT Evaluation $PT Eval Low Complexity: 1 Low PT Treatments $Therapeutic Activity: 8-22 mins   PT G CodesTresa Endo PT [phone]   Claretha Cooper 06/19/2017, 12:53 PM

## 2017-06-20 LAB — CBC
HCT: 30.9 % — ABNORMAL LOW (ref 36.0–46.0)
Hemoglobin: 9.9 g/dL — ABNORMAL LOW (ref 12.0–15.0)
MCH: 30.4 pg (ref 26.0–34.0)
MCHC: 32 g/dL (ref 30.0–36.0)
MCV: 94.8 fL (ref 78.0–100.0)
Platelets: 240 10*3/uL (ref 150–400)
RBC: 3.26 MIL/uL — ABNORMAL LOW (ref 3.87–5.11)
RDW: 13.9 % (ref 11.5–15.5)
WBC: 14.1 10*3/uL — ABNORMAL HIGH (ref 4.0–10.5)

## 2017-06-20 LAB — BASIC METABOLIC PANEL
Anion gap: 11 (ref 5–15)
BUN: 15 mg/dL (ref 6–20)
CO2: 24 mmol/L (ref 22–32)
Calcium: 8.5 mg/dL — ABNORMAL LOW (ref 8.9–10.3)
Chloride: 104 mmol/L (ref 101–111)
Creatinine, Ser: 0.92 mg/dL (ref 0.44–1.00)
GFR calc Af Amer: >60 mL/min (ref >60)
GFR calc non Af Amer: >60 mL/min (ref >60)
Glucose, Bld: 129 mg/dL — ABNORMAL HIGH (ref 65–99)
Potassium: 3.7 mmol/L (ref 3.5–5.1)
Sodium: 139 mmol/L (ref 135–145)

## 2017-06-20 LAB — GLUCOSE, CAPILLARY
Glucose-Capillary: 132 mg/dL — ABNORMAL HIGH (ref 65–99)
Glucose-Capillary: 151 mg/dL — ABNORMAL HIGH (ref 65–99)
Glucose-Capillary: 127 mg/dL — ABNORMAL HIGH (ref 65–99)
Glucose-Capillary: 107 mg/dL — ABNORMAL HIGH (ref 65–99)

## 2017-06-20 NOTE — Progress Notes (Signed)
Physical Therapy Treatment Patient Details Name: Joan [name] MRN: [id number] DOB: 05-Jan-1961 Today's Date: 06/20/2017    History of Present Illness L THA posterior,R THA 4 months ago    PT Comments    POD # 2 am session Assisted OOB with increased time and use of leg lifter.  Assisted with amb to bathroom a limited distance due to c/o fatigue and pain level.  Assisted in bathroom then amb to recliner.  Pt required increased time and rest breaks between acitvity.  Positioned in recliner and applied ICE.  Pt will need ST Rehab at SNF prior to safely returning home.   Follow Up Recommendations  SNF     Equipment Recommendations  None recommended by PT    Recommendations for Other Services       Precautions / Restrictions Precautions Precautions: Posterior Hip Precaution Comments: pt aware of her THP but required reminder about PWB "Oh yea"  Restrictions Weight Bearing Restrictions: Yes LLE Weight Bearing: Partial weight bearing LLE Partial Weight Bearing Percentage or Pounds: 50%    Mobility  Bed Mobility Overal bed mobility: Needs Assistance Bed Mobility: Supine to Sit     Supine to sit: Mod assist     General bed mobility comments: assist with the left leg, able to move trunk to upright plus required increased time  Transfers Overall transfer level: Needs assistance Equipment used: Rolling walker (2 wheeled) Transfers: Sit to/from [facility] Sit to Stand: Min assist;Mod assist Stand pivot transfers: Min assist;Mod assist       General transfer comment: extra time, much encouragement to mobilize, cues for safety and posterior  hip precautions   Also assisted to bathroom  Ambulation/Gait   Ambulation Distance (Feet): 20 Feet(10 feet x 2 to and from bathroom) Assistive device: Rolling walker (2 wheeled) Gait Pattern/deviations: Step-to pattern;Decreased stance time - left     General Gait Details: 50% VC's on PWB and safety with turns     Pt  impulsive to get to bathroom.     Stairs             Wheelchair Mobility    Modified Rankin (Stroke Patients Only)       Balance                                            Cognition Arousal/Alertness: Awake/alert Behavior During Therapy: Restless;Anxious Overall Cognitive Status: Within Functional Limits for tasks assessed                                 General Comments: Hx anxiety      Exercises      General Comments        Pertinent Vitals/Pain Pain Assessment: 0-10 Pain Score: 8  Pain Location: left hip Pain Descriptors / Indicators: Aching;Discomfort;Grimacing;Operative site guarding Pain Intervention(s): Monitored during session;Repositioned;Ice applied;Premedicated before session    Home Living                      Prior Function            PT Goals (current goals can now be found in the care plan section) Progress towards PT goals: Progressing toward goals    Frequency    7X/week      PT Plan Current plan remains appropriate  Co-evaluation              AM-PAC PT "6 Clicks" Daily Activity  Outcome Measure  Difficulty turning over in bed (including adjusting bedclothes, sheets and blankets)?: A Lot Difficulty moving from lying on back to sitting on the side of the bed? : A Lot Difficulty sitting down on and standing up from a chair with arms (e.g., wheelchair, bedside commode, etc,.)?: A Lot Help needed moving to and from a bed to chair (including a wheelchair)?: A Lot Help needed walking in hospital room?: A Lot Help needed climbing 3-5 steps with a railing? : Total 6 Click Score: 11    End of Session Equipment Utilized During Treatment: Gait belt Activity Tolerance: Patient tolerated treatment well Patient left: in chair;with call bell/phone within reach Nurse Communication: Mobility status PT Visit Diagnosis: Unsteadiness on feet (R26.81);Pain Pain - Right/Left: Left Pain -  part of body: Hip     Time: 2841-3244 PT Time Calculation (min) (ACUTE ONLY): 24 min  Charges:  $Gait Training: 8-22 mins $Therapeutic Activity: 8-22 mins                    G Codes:       Rica Koyanagi  PTA [facility]  Acute  Rehab Pager      [phone]

## 2017-06-20 NOTE — Discharge Summary (Addendum)
Physician Discharge Summary   Patient ID: Joan [name] MRN: [id number] DOB/AGE: 1960/10/21 57 y.o.  Admit date: 06/18/2017 Discharge date: Tentative Date of Discharge - Saturday 4/27  Primary Diagnosis: Primary osteoarthritis left hip  Admission Diagnoses:  Past Medical History:  Diagnosis Date  . Alcoholism and drug addiction in family   . Anxiety   . Arthritis    "knees, hips, lower back" (09/25/2016)  . Asthma   . Bipolar disorder ([place])   . Chronic bronchitis ([place])    "all the time" (09/25/2016)  . Chronic leg pain    RLE  . Chronic lower back pain   . COPD (chronic obstructive pulmonary disease) ([place])   . Depression   . Emphysema of lung ([place])   . GERD (gastroesophageal reflux disease)   . Headache   . Hepatitis C    "treated back in 2001-2002"  . High cholesterol   . Hypertension   . Hypothyroidism   . Incarcerated ventral hernia 04/04/2017  . On home oxygen therapy    "2L; at nighttime" (04/04/2017)  . OSA on CPAP   . Pneumonia    "all the time" (09/25/2016)  . Positive PPD    with negative chest x ray  . Tachycardia    patient reports with exertion ; denies any other conditions   . Thyroid disease   . Type II diabetes mellitus ([place])    Discharge Diagnoses:   Active Problems:   H/O total hip arthroplasty, left  Estimated body mass index is 33.3 kg/m as calculated from the following:   Height as of this encounter: _0  (1.727 m).   Weight as of this encounter: 99.3 kg (219 lb).  Procedure(s) (LRB): LEFT TOTAL HIP ARTHROPLASTY (Left)   Consults: None  HPI: Joan [name], 57 y.o. female, has a history of pain and functional disability in the left hip(s) due to arthritis and patient has failed non-surgical conservative treatments for greater than 12 weeks to include NSAID's and/or analgesics, flexibility and strengthening excercises, use of assistive devices and activity modification.  Onset of symptoms was gradual starting 3 years ago with gradually worsening  course since that time.The patient noted no past surgery on the left hip(s).  Patient currently rates pain in the left hip at 8 out of 10 with activity. Patient has night pain, worsening of pain with activity and weight bearing, pain that interfers with activities of daily living and pain with passive range of motion. Patient has evidence of subchondral sclerosis, periarticular osteophytes and joint space narrowing by imaging studies. This condition presents safety issues increasing the risk of falls. There is no current active infection.     Laboratory Data: Admission on 06/18/2017  Component Date Value Ref Range Status  . Glucose-Capillary 06/18/2017 147* 65 - 99 mg/dL Final  . Comment 1 06/18/2017 Notify RN   Final  . Glucose-Capillary 06/18/2017 157* 65 - 99 mg/dL Final  . Glucose-Capillary 06/18/2017 210* 65 - 99 mg/dL Final  . WBC 06/19/2017 15.9* 4.0 - 10.5 K/uL Final  . RBC 06/19/2017 3.34* 3.87 - 5.11 MIL/uL Final  . Hemoglobin 06/19/2017 10.3* 12.0 - 15.0 g/dL Final  . HCT 06/19/2017 31.7* 36.0 - 46.0 % Final  . MCV 06/19/2017 94.9  78.0 - 100.0 fL Final  . MCH 06/19/2017 30.8  26.0 - 34.0 pg Final  . MCHC 06/19/2017 32.5  30.0 - 36.0 g/dL Final  . RDW 06/19/2017 13.5  11.5 - 15.5 % Final  . Platelets 06/19/2017 281  150 -  400 K/uL Final   Performed at [facility] [address], [place], [place] [address]  . Sodium 06/19/2017 141  135 - 145 mmol/L Final  . Potassium 06/19/2017 3.7  3.5 - 5.1 mmol/L Final  . Chloride 06/19/2017 105  101 - 111 mmol/L Final  . CO2 06/19/2017 26  22 - 32 mmol/L Final  . Glucose, Bld 06/19/2017 134* 65 - 99 mg/dL Final  . BUN 06/19/2017 16  6 - 20 mg/dL Final  . Creatinine, Ser 06/19/2017 1.00  0.44 - 1.00 mg/dL Final  . Calcium 06/19/2017 8.5* 8.9 - 10.3 mg/dL Final  . GFR calc non Af Amer 06/19/2017 >60  >60 mL/min Final  . GFR calc Af Amer 06/19/2017 >60  >60 mL/min Final   Comment: (NOTE) The eGFR has been calculated  using the CKD EPI equation. This calculation has not been validated in all clinical situations. eGFR's persistently <60 mL/min signify possible Chronic Kidney Disease.   Georgiann Hahn gap 06/19/2017 10  5 - 15 Final   Performed at [facility], [place] [address] [place], [place] [address]  . Glucose-Capillary 06/18/2017 276* 65 - 99 mg/dL Final  . Glucose-Capillary 06/19/2017 124* 65 - 99 mg/dL Final  . Glucose-Capillary 06/19/2017 236* 65 - 99 mg/dL Final  . WBC 06/20/2017 14.1* 4.0 - 10.5 K/uL Final  . RBC 06/20/2017 3.26* 3.87 - 5.11 MIL/uL Final  . Hemoglobin 06/20/2017 9.9* 12.0 - 15.0 g/dL Final  . HCT 06/20/2017 30.9* 36.0 - 46.0 % Final  . MCV 06/20/2017 94.8  78.0 - 100.0 fL Final  . MCH 06/20/2017 30.4  26.0 - 34.0 pg Final  . MCHC 06/20/2017 32.0  30.0 - 36.0 g/dL Final  . RDW 06/20/2017 13.9  11.5 - 15.5 % Final  . Platelets 06/20/2017 240  150 - 400 K/uL Final   Performed at [facility], [place] [address] [place], [place] [address]  . Sodium 06/20/2017 139  135 - 145 mmol/L Final  . Potassium 06/20/2017 3.7  3.5 - 5.1 mmol/L Final  . Chloride 06/20/2017 104  101 - 111 mmol/L Final  . CO2 06/20/2017 24  22 - 32 mmol/L Final  . Glucose, Bld 06/20/2017 129* 65 - 99 mg/dL Final  . BUN 06/20/2017 15  6 - 20 mg/dL Final  . Creatinine, Ser 06/20/2017 0.92  0.44 - 1.00 mg/dL Final  . Calcium 06/20/2017 8.5* 8.9 - 10.3 mg/dL Final  . GFR calc non Af Amer 06/20/2017 >60  >60 mL/min Final  . GFR calc Af Amer 06/20/2017 >60  >60 mL/min Final   Comment: (NOTE) The eGFR has been calculated using the CKD EPI equation. This calculation has not been validated in all clinical situations. eGFR's persistently <60 mL/min signify possible Chronic Kidney Disease.   Georgiann Hahn gap 06/20/2017 11  5 - 15 Final   Performed at [facility], [place] [address] [place], [place] [address]  . Glucose-Capillary 06/19/2017 112* 65 - 99 mg/dL Final  .  Glucose-Capillary 06/19/2017 141* 65 - 99 mg/dL Final  . Glucose-Capillary 06/20/2017 132* 65 - 99 mg/dL Final  . Glucose-Capillary 06/20/2017 151* 65 - 99 mg/dL Final  Hospital Outpatient Visit on 06/11/2017  Component Date Value Ref Range Status  . MRSA, PCR 06/11/2017 NEGATIVE  NEGATIVE Final  . Staphylococcus aureus 06/11/2017 NEGATIVE  NEGATIVE Final   Comment: (NOTE) The Xpert SA Assay (FDA approved for NASAL specimens in patients 83 years of age and older), is one  component of a comprehensive surveillance program. It is not intended to diagnose infection nor to guide or monitor treatment. Performed at [facility], [place] [address], [place], [place] [address]   . ABO/RH(D) 06/11/2017 B POS   Final  . Antibody Screen 06/11/2017 NEG   Final  . Sample Expiration 06/11/2017 06/21/2017   Final  . Extend sample reason 06/11/2017    Final                   Value:NO TRANSFUSIONS OR PREGNANCY IN THE PAST 3 MONTHS Performed at [facility], [place] [address], [place], [place] [address]   . Hgb A1c MFr Bld 06/11/2017 6.1* 4.8 - 5.6 % Final   Comment: (NOTE) Pre diabetes:          5.7%-6.4% Diabetes:              >6.4% Glycemic control for   <7.0% adults with diabetes   . Mean Plasma Glucose 06/11/2017 128.37  mg/dL Final   Performed at [facility] [address], [place], [place] [address]  Admission on 06/02/2017, Discharged on 06/03/2017  Component Date Value Ref Range Status  . Lipase 06/02/2017 37  11 - 51 U/L Final   Performed at [facility] [address], [place], [place] [address]  . Sodium 06/02/2017 141  135 - 145 mmol/L Final  . Potassium 06/02/2017 4.2  3.5 - 5.1 mmol/L Final  . Chloride 06/02/2017 108  101 - 111 mmol/L Final  . CO2 06/02/2017 26  22 - 32 mmol/L Final  . Glucose, Bld 06/02/2017 121* 65 - 99 mg/dL Final  . BUN 06/02/2017 15  6 - 20 mg/dL Final  . Creatinine, Ser 06/02/2017 1.13* 0.44 - 1.00 mg/dL Final    . Calcium 06/02/2017 9.0  8.9 - 10.3 mg/dL Final  . Total Protein 06/02/2017 7.1  6.5 - 8.1 g/dL Final  . Albumin 06/02/2017 4.0  3.5 - 5.0 g/dL Final  . AST 06/02/2017 15  15 - 41 U/L Final  . ALT 06/02/2017 16  14 - 54 U/L Final  . Alkaline Phosphatase 06/02/2017 130* 38 - 126 U/L Final  . Total Bilirubin 06/02/2017 0.5  0.3 - 1.2 mg/dL Final  . GFR calc non Af Amer 06/02/2017 54* >60 mL/min Final  . GFR calc Af Amer 06/02/2017 >60  >60 mL/min Final   Comment: (NOTE) The eGFR has been calculated using the CKD EPI equation. This calculation has not been validated in all clinical situations. eGFR's persistently <60 mL/min signify possible Chronic Kidney Disease.   Georgiann Hahn gap 06/02/2017 7  5 - 15 Final   Performed at [facility], [place] [address], [place], [place] [address]  . WBC 06/02/2017 11.7* 4.0 - 10.5 K/uL Final  . RBC 06/02/2017 4.32  3.87 - 5.11 MIL/uL Final  . Hemoglobin 06/02/2017 13.3  12.0 - 15.0 g/dL Final  . HCT 06/02/2017 41.0  36.0 - 46.0 % Final  . MCV 06/02/2017 94.9  78.0 - 100.0 fL Final  . MCH 06/02/2017 30.8  26.0 - 34.0 pg Final  . MCHC 06/02/2017 32.4  30.0 - 36.0 g/dL Final  . RDW 06/02/2017 13.6  11.5 - 15.5 % Final  . Platelets 06/02/2017 283  150 - 400 K/uL Final   Performed at [facility], [place] [address], [place], [place] [address]  . Color, Urine 06/02/2017 YELLOW  YELLOW Final  . APPearance 06/02/2017 CLEAR  CLEAR Final  . Specific Gravity, Urine 06/02/2017 1.015  1.005 - 1.030 Final  . pH 06/02/2017 5.0  5.0 - 8.0 Final  . Glucose, UA 06/02/2017 NEGATIVE  NEGATIVE mg/dL Final  . Hgb urine dipstick 06/02/2017 SMALL* NEGATIVE Final  . Bilirubin Urine 06/02/2017 NEGATIVE  NEGATIVE Final  . Ketones, ur 06/02/2017 NEGATIVE  NEGATIVE mg/dL Final  . Protein, ur 06/02/2017 NEGATIVE  NEGATIVE mg/dL Final  . Nitrite 06/02/2017 NEGATIVE  NEGATIVE Final  . Leukocytes, UA 06/02/2017 NEGATIVE  NEGATIVE Final  . RBC / HPF 06/02/2017 0-5  0 - 5  RBC/hpf Final  . WBC, UA 06/02/2017 0-5  0 - 5 WBC/hpf Final  . Bacteria, UA 06/02/2017 NONE SEEN  NONE SEEN Final  . Squamous Epithelial / LPF 06/02/2017 0-5* NONE SEEN Final  . Mucus 06/02/2017 PRESENT   Final   Performed at [facility], [place] [address] [place], [place] [address]  . I-stat hCG, quantitative 06/02/2017 <5.0  <5 mIU/mL Final  . Comment 3 06/02/2017          Final   Comment:   GEST. AGE      CONC.  (mIU/mL)   <=1 WEEK        5 - 50     2 WEEKS       50 - 500     3 WEEKS       100 - 10,000     4 WEEKS     1,000 - 30,000        FEMALE AND NON-PREGNANT FEMALE:     LESS THAN 5 mIU/mL      X-Rays:Ct Abdomen Pelvis W Contrast  Result Date: 06/03/2017 CLINICAL DATA:  57 y/o F; right upper quadrant abdominal pain. Mesh placement 1 month ago. EXAM: CT ABDOMEN AND PELVIS WITH CONTRAST TECHNIQUE: Multidetector CT imaging of the abdomen and pelvis was performed using the standard protocol following bolus administration of intravenous contrast. CONTRAST:  175m ISOVUE-300 IOPAMIDOL (ISOVUE-300) INJECTION 61% COMPARISON:  11/21/2016 CT abdomen and pelvis. FINDINGS: Lower chest: Emphysema in the lung bases. Hepatobiliary: No focal liver abnormality is seen. No gallstones, gallbladder wall thickening, or biliary dilatation. Pancreas: Unremarkable. No pancreatic ductal dilatation or surrounding inflammatory changes. Spleen: Punctate calcifications within the spleen, likely granulomas. No additional focal splenic lesion identified. Adrenals/Urinary Tract: Adrenal glands are unremarkable. Kidneys are normal, without renal calculi, focal lesion, or hydronephrosis. Bladder is unremarkable. Stomach/Bowel: Stomach is within normal limits. Appendix appears normal. No evidence of bowel wall thickening, distention, or inflammatory changes. Vascular/Lymphatic: Aortic atherosclerosis. No enlarged abdominal or pelvic lymph nodes. Reproductive: Uterus and bilateral adnexa are unremarkable. Other: Mesh  repair of umbilical hernia. Small fluid collection in the extraperitoneal abdominal wall superficial to the mesh in the umbilical area measuring 3.1 x 2.4 x 2.8 cm (AP x ML x CC series 3, image 40 and series 7, image 67). No residual hernia of peritoneal fat or bowel. Musculoskeletal: Stable L5-S1 posterior instrumented fusion in grade 1 anterolisthesis. No acute fracture identified. Right hip replacement. IMPRESSION: 1. Mesh repair of umbilical hernia. Fluid collection within the umbilical area superficial to mesh measuring up to 3.1 cm. This probably represents a postoperative seroma, infection is unlikely as there is minimal edema. No residual/recurrent hernia of peritoneal fat or bowel. 2. Otherwise stable chronic findings as above. Electronically Signed   By: LKristine GarbeM.D.   On: 06/03/2017 05:41   Dg Hip Port Unilat With Pelvis 1v Left  Result Date: 06/18/2017 CLINICAL  DATA:  Status post left hip arthroplasty EXAM: DG HIP (WITH OR WITHOUT PELVIS) 1V PORT LEFT COMPARISON:  Coronal and sagittal reconstructed images through the hips from a CT scan of November 21, 2016 FINDINGS: There is a pre-existing right total hip joint prosthesis. On the left there is a newly placed prosthesis. Positioning of the prosthetic components is good. The interface with the native bone appears normal. No acute native bone abnormality is observed. IMPRESSION: No immediate postprocedure complication following left total hip joint prosthesis placement. Electronically Signed   By: David  Martinique M.D.   On: 06/18/2017 12:58    EKG: Orders placed or performed during the hospital encounter of 04/04/17  . EKG 12-[place]  . EKG 12-[place]  . EKG 12-[place]  . EKG 12-[place]     Hospital Course: Patient was admitted to [facility] and taken to the OR and underwent the above state procedure without complications.  Patient tolerated the procedure well and was later transferred to the recovery room and then to the  orthopaedic floor for postoperative care.  They were given PO and IV analgesics for pain control following their surgery.  They were given 24 hours of postoperative antibiotics of  Anti-infectives (From admission, onward)   Start     Dose/Rate Route Frequency Ordered Stop   06/18/17 1800  vancomycin (VANCOCIN) IVPB 1000 mg/200 mL premix     1,000 mg 200 mL/hr over 60 Minutes Intravenous Every 12 hours 06/18/17 1214 06/18/17 1937   06/18/17 1600  valACYclovir (VALTREX) tablet 500 mg     500 mg Oral Daily 06/18/17 1214     06/18/17 0835  polymyxin B 500,000 Units, bacitracin 50,000 Units in sodium chloride 0.9 % 500 mL irrigation  Status:  Discontinued       As needed 06/18/17 0835 06/18/17 1006   06/18/17 0700  vancomycin (VANCOCIN) IVPB 1000 mg/200 mL premix     1,000 mg 200 mL/hr over 60 Minutes Intravenous On call to O.R. 06/18/17 3785 06/18/17 0953   06/18/17 0658  vancomycin (VANCOCIN) 1-5 GM/200ML-% IVPB    Note to Pharmacy:  [place]   : cabinet override      06/18/17 8850 06/18/17 0801     and started on DVT prophylaxis in the form of Xarelto.   PT and OT were ordered for total hip protocol.  The patient was allowed to be WBAT with therapy. Discharge planning was consulted to help with postop disposition and equipment needs.  Patient had a fair night on the evening of surgery.  They started to get up OOB with therapy on day one.  The knee immobilizer was removed and discontinued.  Continued to work with therapy into day two. Patient was seen in rounds on day two and was doing well.  It was felt that as long as the patient continued to improve, they would be ready to transfer to SNF on Saturday 4/27.   Diet: Cardiac diet and Diabetic diet Activity:WBAT No bending hip over 90 degrees- A "L" Angle Do not cross legs Do not let foot roll inward When turning these patients a pillow should be placed between the patient's legs to prevent crossing. Patients should have the affected knee  fully extended when trying to sit or stand from all surfaces to prevent excessive hip flexion. When ambulating and turning toward the affected side the affected leg should have the toes turned out prior to moving the walker and the rest of patient's body as to prevent internal rotation/  turning in of the leg. Abduction pillows are the most effective way to prevent a patient from not crossing legs or turning toes in at rest. If an abduction pillow is not ordered placing a regular pillow length wise between the patient's legs is also an effective reminder. It is imperative that these precautions be maintained so that the surgical hip does not dislocate. Follow-up:in 2 weeks Disposition - Skilled nursing facility Discharged Condition: stable   Discharge Instructions    Call MD / Call 911   Complete by:  As directed    If you experience chest pain or shortness of breath, CALL 911 and be transported to the hospital emergency room.  If you develope a fever above 101 F, pus (white drainage) or increased drainage or redness at the wound, or calf pain, call your surgeon's office.   Constipation Prevention   Complete by:  As directed    Drink plenty of fluids.  Prune juice may be helpful.  You may use a stool softener, such as Colace (over the counter) 100 mg twice a day.  Use MiraLax (over the counter) for constipation as needed.   Diet - low sodium heart healthy   Complete by:  As directed    Discharge instructions   Complete by:  As directed    Dr. [name] [facility] [address] [place], [place] [address] [phone]  TOTAL KNEE REPLACEMENT POSTOPERATIVE DIRECTIONS  Knee Rehabilitation, Guidelines Following Surgery  Results after knee surgery are often greatly improved when you follow the exercise, range of motion and muscle strengthening exercises prescribed by your doctor. Safety measures are also important to protect the knee from further injury. Any time any of  these exercises cause you to have increased pain or swelling in your knee joint, decrease the amount until you are comfortable again and slowly increase them. If you have problems or questions, call your caregiver or physical therapist for advice.   HOME CARE INSTRUCTIONS  Remove items at home which could result in a fall. This includes throw rugs or furniture in walking pathways.  ICE to the affected knee every three hours for 30 minutes at a time and then as needed for pain and swelling.  Continue to use ice on the knee for pain and swelling from surgery. You may notice swelling that will progress down to the foot and ankle.  This is normal after surgery.  Elevate the leg when you are not up walking on it.   Continue to use the breathing machine which will help keep your temperature down.  It is common for your temperature to cycle up and down following surgery, especially at night when you are not up moving around and exerting yourself.  The breathing machine keeps your lungs expanded and your temperature down. Do not place pillow under knee, focus on keeping the knee straight while resting  DIET You may resume your previous home diet once your are discharged from the hospital.  DRESSING / WOUND CARE / SHOWERING The dressing is waterproof and will remain on until the follow up appointment.  You may start showering once you are discharged home but do not submerge the incision under water.   ACTIVITY Walk with your walker as instructed. Use walker as long as suggested by your caregivers. Avoid periods of inactivity such as sitting longer than an hour when not asleep. This helps prevent blood clots.  You may resume a sexual relationship in one month or when given the  OK by your doctor.  You may return to work once you are cleared by your doctor.  Do not drive a car for 6 weeks or until released by you surgeon.  Do not drive while taking narcotics.  WEIGHT BEARING Weight bearing as tolerated  with assist device (walker, cane, etc) as directed, use it as long as suggested by your surgeon or therapist, typically at least 4-6 weeks.  POSTOPERATIVE CONSTIPATION PROTOCOL Constipation - defined medically as fewer than three stools per week and severe constipation as less than one stool per week.  One of the most common issues patients have following surgery is constipation.  Even if you have a regular bowel pattern at home, your normal regimen is likely to be disrupted due to multiple reasons following surgery.  Combination of anesthesia, postoperative narcotics, change in appetite and fluid intake all can affect your bowels.  In order to avoid complications following surgery, here are some recommendations in order to help you during your recovery period.  Colace (docusate) - Pick up an over-the-counter form of Colace or another stool softener and take twice a day as long as you are requiring postoperative pain medications.  Take with a full glass of water daily.  If you experience loose stools or diarrhea, hold the colace until you stool forms back up.  If your symptoms do not get better within 1 week or if they get worse, check with your doctor.  Dulcolax (bisacodyl) - Pick up over-the-counter and take as directed by the product packaging as needed to assist with the movement of your bowels.  Take with a full glass of water.  Use this product as needed if not relieved by Colace only.   MiraLax (polyethylene glycol) - Pick up over-the-counter to have on hand.  MiraLax is a solution that will increase the amount of water in your bowels to assist with bowel movements.  Take as directed and can mix with a glass of water, juice, soda, coffee, or tea.  Take if you go more than two days without a movement. Do not use MiraLax more than once per day. Call your doctor if you are still constipated or irregular after using this medication for 7 days in a row.  If you continue to have problems with  postoperative constipation, please contact the office for further assistance and recommendations.  If you experience "the worst abdominal pain ever" or develop nausea or vomiting, please contact the office immediatly for further recommendations for treatment.  ITCHING  If you experience itching with your medications, try taking only a single pain pill, or even half a pain pill at a time.  You can also use Benadryl over the counter for itching or also to help with sleep.   TED HOSE STOCKINGS Wear the elastic stockings on both legs for three weeks following surgery during the day but you may remove then at night for sleeping.  MEDICATIONS See your medication summary on the "After Visit Summary" that the nursing staff will review with you prior to discharge.  You may have some home medications which will be placed on hold until you complete the course of blood thinner medication.  It is important for you to complete the blood thinner medication as prescribed by your surgeon.  Continue your approved medications as instructed at time of discharge.  PRECAUTIONS If you experience chest pain or shortness of breath - call 911 immediately for transfer to the hospital emergency department.  If you develop a fever greater  that 101 F, purulent drainage from wound, increased redness or drainage from wound, foul odor from the wound/dressing, or calf pain - CONTACT YOUR SURGEON.                                                   FOLLOW-UP APPOINTMENTS Make sure you keep all of your appointments after your operation with your surgeon and caregivers. You should call the office at the above phone number and make an appointment for approximately two weeks after the date of your surgery or on the date instructed by your surgeon outlined in the "After Visit Summary".   RANGE OF MOTION AND STRENGTHENING EXERCISES  Rehabilitation of the knee is important following a knee injury or an operation. After just a few days of  immobilization, the muscles of the thigh which control the knee become weakened and shrink (atrophy). Knee exercises are designed to build up the tone and strength of the thigh muscles and to improve knee motion. Often times heat used for twenty to thirty minutes before working out will loosen up your tissues and help with improving the range of motion but do not use heat for the first two weeks following surgery. These exercises can be done on a training (exercise) mat, on the floor, on a table or on a bed. Use what ever works the best and is most comfortable for you Knee exercises include:  Leg Lifts - While your knee is still immobilized in a splint or cast, you can do straight leg raises. Lift the leg to 60 degrees, hold for 3 sec, and slowly lower the leg. Repeat 10-20 times 2-3 times daily. Perform this exercise against resistance later as your knee gets better.  Quad and Hamstring Sets - Tighten up the muscle on the front of the thigh (Quad) and hold for 5-10 sec. Repeat this 10-20 times hourly. Hamstring sets are done by pushing the foot backward against an object and holding for 5-10 sec. Repeat as with quad sets.  Leg Slides: Lying on your back, slowly slide your foot toward your buttocks, bending your knee up off the floor (only go as far as is comfortable). Then slowly slide your foot back down until your leg is flat on the floor again. Angel Wings: Lying on your back spread your legs to the side as far apart as you can without causing discomfort.  A rehabilitation program following serious knee injuries can speed recovery and prevent re-injury in the future due to weakened muscles. Contact your doctor or a physical therapist for more information on knee rehabilitation.   IF YOU ARE TRANSFERRED TO A SKILLED REHAB FACILITY If the patient is transferred to a skilled rehab facility following release from the hospital, a list of the current medications will be sent to the facility for the patient to  continue.  When discharged from the skilled rehab facility, please have the facility set up the patient's [place] prior to being released. Also, the skilled facility will be responsible for providing the patient with their medications at time of release from the facility to include their pain medication, the muscle relaxants, and their blood thinner medication. If the patient is still at the rehab facility at time of the two week follow up appointment, the skilled rehab facility will also need to assist the patient in  arranging follow up appointment in our office and any transportation needs.  MAKE SURE YOU:  Understand these instructions.  Get help right away if you are not doing well or get worse.    Pick up stool softner and laxative for home use following surgery while on pain medications. Do not submerge incision under water. Please use good hand washing techniques while changing dressing each day. May shower starting three days after surgery. Please use a clean towel to pat the incision dry following showers. Continue to use ice for pain and swelling after surgery. Do not use any lotions or creams on the incision until instructed by your surgeon.   Follow the hip precautions as taught in Physical Therapy   Complete by:  As directed    Increase activity slowly as tolerated   Complete by:  As directed      Allergies as of 06/20/2017      Reactions   Keflex [cephalexin] Other (See Comments)   unspecified   Prednisone Other (See Comments)   "counter reacts"   Shellfish Allergy Other (See Comments)   Stomach cramps   Tetracyclines & Related Other (See Comments)   unspecified   Dilaudid [hydromorphone Hcl] Other (See Comments)   "Lethargy"   Incruse Ellipta [umeclidinium Bromide] Nausea Only      Medication List    STOP taking these medications   HYDROcodone-acetaminophen 5-325 MG tablet Commonly known as:  NORCO/VICODIN   ketorolac 10 MG tablet Commonly  known as:  TORADOL     TAKE these medications   acetaminophen 325 MG tablet Commonly known as:  TYLENOL Take 1-2 tablets (325-650 mg total) by mouth every 6 (six) hours as needed for mild pain (pain score 1-3 or temp > 100.5).   albuterol (2.5 MG/3ML) 0.083% nebulizer solution Commonly known as:  PROVENTIL Take 2.5 mg by nebulization every 6 (six) hours as needed for wheezing or shortness of breath.   albuterol 108 (90 Base) MCG/ACT inhaler Commonly known as:  PROVENTIL HFA;VENTOLIN HFA Inhale 2 puffs into the lungs every 6 (six) hours as needed for wheezing or shortness of breath (before activity).   atenolol 25 MG tablet Commonly known as:  TENORMIN Take 1 tablet (25 mg total) by mouth daily.   atorvastatin 20 MG tablet Commonly known as:  LIPITOR Take 20 mg by mouth daily.   baclofen 10 MG tablet Commonly known as:  LIORESAL Take 10 mg by mouth at bedtime.   budesonide-formoterol 160-4.5 MCG/ACT inhaler Commonly known as:  SYMBICORT Inhale 2 puffs into the lungs 2 (two) times daily.   diphenhydrAMINE 25 mg capsule Commonly known as:  BENADRYL Take 25 mg by mouth 2 (two) times daily.   docusate sodium 100 MG capsule Commonly known as:  COLACE Take 1 capsule (100 mg total) by mouth 2 (two) times daily.   ipratropium 17 MCG/ACT inhaler Commonly known as:  ATROVENT HFA Inhale 2 puffs into the lungs 4 (four) times daily. What changed:  when to take this   levothyroxine 25 MCG tablet Commonly known as:  SYNTHROID, LEVOTHROID Take 1 tablet (25 mcg total) by mouth daily before breakfast.   Melatonin 5 MG Caps Take 1 capsule (5 mg total) by mouth at bedtime.   methocarbamol 500 MG tablet Commonly known as:  ROBAXIN Take 1 tablet (500 mg total) by mouth every 6 (six) hours as needed for muscle spasms.   montelukast 10 MG tablet Commonly known as:  SINGULAIR Take 1 tablet (10 mg total) by mouth  at bedtime.   naproxen 500 MG tablet Commonly known as:   NAPROSYN Take 1 tablet (500 mg total) by mouth 2 (two) times daily with a meal. What changed:    when to take this  reasons to take this   nystatin-triamcinolone ointment Commonly known as:  MYCOLOG Apply 1 application topically 2 (two) times daily as needed (applied to skin folds/groins/under breast skin irritation rash.).   oxyCODONE 5 MG immediate release tablet Commonly known as:  Oxy IR/ROXICODONE Take 1-2 tablets (5-10 mg total) by mouth every 6 (six) hours as needed for severe pain (pain score 4-6).   pantoprazole 40 MG tablet Commonly known as:  PROTONIX Take 40 mg by mouth 2 (two) times daily.   polyethylene glycol packet Commonly known as:  MIRALAX / GLYCOLAX Take 17 g by mouth daily as needed for mild constipation.   pregabalin 75 MG capsule Commonly known as:  LYRICA Take 1 capsule (75 mg total) by mouth 2 (two) times daily.   roflumilast 500 MCG Tabs tablet Commonly known as:  DALIRESP Take 1 tablet (500 mcg total) by mouth daily.   traMADol 50 MG tablet Commonly known as:  ULTRAM Take 1 tablet (50 mg total) by mouth every 6 (six) hours as needed for moderate pain.   valACYclovir 500 MG tablet Commonly known as:  VALTREX Take 500 mg by mouth daily.   varenicline 1 MG tablet Commonly known as:  CHANTIX CONTINUING MONTH PAK Take 1 tablet (1 mg total) by mouth 2 (two) times daily.   venlafaxine XR 150 MG 24 hr capsule Commonly known as:  EFFEXOR-XR Take 1 capsule (150 mg total) by mouth daily with breakfast.       Contact information for follow-up providers    [name], MD. Schedule an appointment as soon as possible for a visit in 2 week(s).   Specialty:  Orthopedic Surgery Contact information: [address] [place] [address] [phone]            Contact information for after-discharge care    [facility] .   Service:  Skilled Nursing Contact information: [address] [place] [phone]                  Signed: Ardeen Jourdain, PA-C Orthopaedic Surgery 06/20/2017, 1:09 PM

## 2017-06-20 NOTE — Progress Notes (Signed)
PHYSICAL THERAPY  Pt just got back to bed with NT assist from bathroom and requested to rest.  Pt plans to D/C to SNF for ST Rehab.  Rica Koyanagi  PTA [facility]  Acute  Rehab Pager      [phone]

## 2017-06-20 NOTE — Progress Notes (Signed)
Subjective: 2 Days Post-Op Procedure(s) (LRB): LEFT TOTAL HIP ARTHROPLASTY (Left) Patient reports pain as 2 on 0-10 scale.    Objective: Vital signs in last 24 hours: Temp:  [97.8 F (36.6 C)-100.1 F (37.8 C)] 97.8 F (36.6 C) (04/26 0527) Pulse Rate:  [84-98] 98 (04/26 0527) Resp:  [14-22] 22 (04/26 0527) BP: (99-144)/(67-76) 144/76 (04/26 0527) SpO2:  [85 %-96 %] 93 % (04/26 0544)  Intake/Output from previous day: 04/25 0701 - 04/26 0700 In: 2338.3 [P.O.:1140; I.V.:1198.3] Out: 2250 [Urine:2250] Intake/Output this shift: No intake/output data recorded.  Recent Labs    06/19/17 0534 06/20/17 0600  HGB 10.3* 9.9*   Recent Labs    06/19/17 0534 06/20/17 0600  WBC 15.9* 14.1*  RBC 3.34* 3.26*  HCT 31.7* 30.9*  PLT 281 240   Recent Labs    06/19/17 0534 06/20/17 0600  NA 141 139  K 3.7 3.7  CL 105 104  CO2 26 24  BUN 16 15  CREATININE 1.00 0.92  GLUCOSE 134* 129*  CALCIUM 8.5* 8.5*   No results for input(s): LABPT, INR in the last 72 hours.  Dorsiflexion/Plantar flexion intact  Admitted for Total Hip  Assessment/Plan: 2 Days Post-Op Procedure(s) (LRB): LEFT TOTAL HIP ARTHROPLASTY (Left) Discharge to SNF    Joan [name] 06/20/2017, 7:21 AM

## 2017-06-21 DIAGNOSIS — J9611 Chronic respiratory failure with hypoxia: Secondary | ICD-10-CM | POA: Diagnosis not present

## 2017-06-21 DIAGNOSIS — M6281 Muscle weakness (generalized): Secondary | ICD-10-CM | POA: Diagnosis not present

## 2017-06-21 DIAGNOSIS — R69 Illness, unspecified: Secondary | ICD-10-CM | POA: Diagnosis not present

## 2017-06-21 DIAGNOSIS — J449 Chronic obstructive pulmonary disease, unspecified: Secondary | ICD-10-CM | POA: Diagnosis not present

## 2017-06-21 DIAGNOSIS — G4733 Obstructive sleep apnea (adult) (pediatric): Secondary | ICD-10-CM | POA: Diagnosis not present

## 2017-06-21 DIAGNOSIS — J441 Chronic obstructive pulmonary disease with (acute) exacerbation: Secondary | ICD-10-CM | POA: Diagnosis not present

## 2017-06-21 DIAGNOSIS — K219 Gastro-esophageal reflux disease without esophagitis: Secondary | ICD-10-CM | POA: Diagnosis not present

## 2017-06-21 DIAGNOSIS — E034 Atrophy of thyroid (acquired): Secondary | ICD-10-CM | POA: Diagnosis not present

## 2017-06-21 DIAGNOSIS — E119 Type 2 diabetes mellitus without complications: Secondary | ICD-10-CM | POA: Diagnosis not present

## 2017-06-21 DIAGNOSIS — E785 Hyperlipidemia, unspecified: Secondary | ICD-10-CM | POA: Diagnosis not present

## 2017-06-21 DIAGNOSIS — M549 Dorsalgia, unspecified: Secondary | ICD-10-CM | POA: Diagnosis not present

## 2017-06-21 DIAGNOSIS — I10 Essential (primary) hypertension: Secondary | ICD-10-CM | POA: Diagnosis not present

## 2017-06-21 DIAGNOSIS — D649 Anemia, unspecified: Secondary | ICD-10-CM | POA: Diagnosis not present

## 2017-06-21 DIAGNOSIS — R7303 Prediabetes: Secondary | ICD-10-CM | POA: Diagnosis not present

## 2017-06-21 DIAGNOSIS — Z96642 Presence of left artificial hip joint: Secondary | ICD-10-CM | POA: Diagnosis not present

## 2017-06-21 DIAGNOSIS — M169 Osteoarthritis of hip, unspecified: Secondary | ICD-10-CM | POA: Diagnosis not present

## 2017-06-21 DIAGNOSIS — M150 Primary generalized (osteo)arthritis: Secondary | ICD-10-CM | POA: Diagnosis not present

## 2017-06-21 DIAGNOSIS — Z471 Aftercare following joint replacement surgery: Secondary | ICD-10-CM | POA: Diagnosis not present

## 2017-06-21 DIAGNOSIS — Z96641 Presence of right artificial hip joint: Secondary | ICD-10-CM | POA: Diagnosis not present

## 2017-06-21 DIAGNOSIS — S79911A Unspecified injury of right hip, initial encounter: Secondary | ICD-10-CM | POA: Diagnosis not present

## 2017-06-21 DIAGNOSIS — G894 Chronic pain syndrome: Secondary | ICD-10-CM | POA: Diagnosis not present

## 2017-06-21 LAB — CBC
HCT: 29 % — ABNORMAL LOW (ref 36.0–46.0)
Hemoglobin: 9.3 g/dL — ABNORMAL LOW (ref 12.0–15.0)
MCH: 30.3 pg (ref 26.0–34.0)
MCHC: 32.1 g/dL (ref 30.0–36.0)
MCV: 94.5 fL (ref 78.0–100.0)
Platelets: 239 10*3/uL (ref 150–400)
RBC: 3.07 MIL/uL — ABNORMAL LOW (ref 3.87–5.11)
RDW: 14 % (ref 11.5–15.5)
WBC: 12.9 10*3/uL — ABNORMAL HIGH (ref 4.0–10.5)

## 2017-06-21 LAB — GLUCOSE, CAPILLARY: Glucose-Capillary: 151 mg/dL — ABNORMAL HIGH (ref 65–99)

## 2017-06-21 NOTE — Progress Notes (Signed)
Subjective: 3 Days Post-Op Procedure(s) (LRB): LEFT TOTAL HIP ARTHROPLASTY (Left) Patient reports pain as 2 on 0-10 scale.  Up eating breakfast this morning with no complaints.  Objective: Vital signs in last 24 hours: Temp:  [97.5 F (36.4 C)-98.1 F (36.7 C)] 98.1 F (36.7 C) (04/27 0607) Pulse Rate:  [93-95] 93 (04/27 0607) Resp:  [18] 18 (04/27 0607) BP: (147-183)/(84-96) 183/96 (04/27 0607) SpO2:  [86 %-99 %] 96 % (04/27 0741)  Intake/Output from previous day: 04/26 0701 - 04/27 0700 In: 800 [I.V.:800] Out: 800 [Urine:800] Intake/Output this shift: Total I/O In: 240 [P.O.:240] Out: 400 [Urine:400]  Recent Labs    06/19/17 0534 06/20/17 0600 06/21/17 0507  HGB 10.3* 9.9* 9.3*   Recent Labs    06/20/17 0600 06/21/17 0507  WBC 14.1* 12.9*  RBC 3.26* 3.07*  HCT 30.9* 29.0*  PLT 240 239   Recent Labs    06/19/17 0534 06/20/17 0600  NA 141 139  K 3.7 3.7  CL 105 104  CO2 26 24  BUN 16 15  CREATININE 1.00 0.92  GLUCOSE 134* 129*  CALCIUM 8.5* 8.5*   No results for input(s): LABPT, INR in the last 72 hours.  Dorsiflexion/Plantar flexion intact  Left hip dressing with scant drainage proximally.  Admitted for Total Hip  Assessment/Plan: 3 Days Post-Op Procedure(s) (LRB): LEFT TOTAL HIP ARTHROPLASTY (Left) Discharge to SNF today    Nicholes Stairs 06/21/2017, 10:06 AM

## 2017-06-21 NOTE — Progress Notes (Signed)
Discharged from floor via stretcher for transport to SNF by ambulance. EMT & belongings with pt. No changes in assessment. Gram Siedlecki, [employer]

## 2017-06-21 NOTE — Progress Notes (Signed)
Patient with new complaint of left knee pain.  States that ice makes it hurt worse.  Likes a heating pack on it.

## 2017-06-21 NOTE — Progress Notes (Signed)
Physical Therapy Treatment Patient Details Name: Joan [name] MRN: [id number] DOB: [birth year] Today's Date: 06/21/2017    History of Present Illness L THA posterior,R THA 4 months ago    PT Comments    POD # 3 Performed some TE while in bed but limited due to pain level.  Assisted OOB to [facility] then amb only a fewe feet as PTAR arrived to take pt to SNF.    Follow Up Recommendations  SNF     Equipment Recommendations  None recommended by PT    Recommendations for Other Services       Precautions / Restrictions Precautions Precautions: Posterior Hip Precaution Comments: pt aware of her THP but required reminder about PWB "Oh yea"  Restrictions Weight Bearing Restrictions: Yes LLE Partial Weight Bearing Percentage or Pounds: 50%    Mobility  Bed Mobility Overal bed mobility: Needs Assistance Bed Mobility: Supine to Sit     Supine to sit: Mod assist     General bed mobility comments: assist with the left leg, able to move trunk to upright plus required increased time  Transfers Overall transfer level: Needs assistance Equipment used: Rolling walker (2 wheeled) Transfers: Sit to/from [employer] Sit to Stand: Min assist;Mod assist Stand pivot transfers: Min assist;Mod assist       General transfer comment: extra time, much encouragement to mobilize, cues for safety and posterior  hip precautions   Assisted on/off BSC.  Ambulation/Gait Ambulation/Gait assistance: Min assist Ambulation Distance (Feet): 2 Feet Assistive device: Rolling walker (2 wheeled) Gait Pattern/deviations: Step-to pattern;Decreased stance time - left Gait velocity: decreased   General Gait Details: 50% VC's on PWB and safety with turns    Amb onlty a few feet from [facility] to [employer]             Wheelchair Mobility    Modified Rankin (Stroke Patients Only)       Balance                                            Cognition  Arousal/Alertness: Awake/alert Behavior During Therapy: WFL for tasks assessed/performed Overall Cognitive Status: Within Functional Limits for tasks assessed                                 General Comments: Hx anxiety      Exercises   10 reps AP, knee presses and ARROM HS   General Comments        Pertinent Vitals/Pain Pain Assessment: 0-10 Pain Score: 9  Pain Location: left hip Pain Descriptors / Indicators: Aching;Discomfort;Grimacing;Operative site guarding Pain Intervention(s): Monitored during session;Repositioned;Ice applied;Premedicated before session    Home Living                      Prior Function            PT Goals (current goals can now be found in the care plan section) Progress towards PT goals: Progressing toward goals    Frequency    7X/week      PT Plan Current plan remains appropriate    Co-evaluation              AM-PAC PT "6 Clicks" Daily Activity  Outcome Measure  Difficulty turning over in bed (including adjusting bedclothes, sheets  and blankets)?: A Lot Difficulty moving from lying on back to sitting on the side of the bed? : A Lot Difficulty sitting down on and standing up from a chair with arms (e.g., wheelchair, bedside commode, etc,.)?: A Lot Help needed moving to and from a bed to chair (including a wheelchair)?: A Lot Help needed walking in hospital room?: A Lot Help needed climbing 3-5 steps with a railing? : Total 6 Click Score: 11    End of Session Equipment Utilized During Treatment: Gait belt Activity Tolerance: Patient tolerated treatment well Patient left: Other (comment)(PTAR stretcher)   PT Visit Diagnosis: Unsteadiness on feet (R26.81);Pain Pain - Right/Left: Left Pain - part of body: Hip     Time: 1130-1200 PT Time Calculation (min) (ACUTE ONLY): 30 min  Charges:  $Therapeutic Exercise: 8-22 mins $Therapeutic Activity: 8-22 mins                    G Codes:       {Shalon Salado  PTA [facility]  Acute  Rehab Pager      [phone]

## 2017-06-21 NOTE — Progress Notes (Signed)
Report given by phone to [facility]. Lu Paradise, [employer]

## 2017-06-21 NOTE — Progress Notes (Signed)
Attempted to call report, unable to reach staff member. Parminder Trapani, [employer]

## 2017-06-21 NOTE — Clinical Social Work Placement (Signed)
Patient received and accepted bed offer at [facility]. Facility aware of patient's discharge and confirmed bed offer. PTAR contacted, patient aware and CSW left voicemail for patient's brother.Patients's RN can call report to [phone], packet complete. CSW signing off, no other needs identified at this time.  CLINICAL SOCIAL WORK PLACEMENT  NOTE  Date:  06/21/2017  Patient Details  Name: Joan [name] MRN: [id number] Date of Birth: [birth year]  Clinical Social Work is seeking post-discharge placement for this patient at the [place] level of care (*CSW will initial, date and re-position this form in  chart as items are completed):  Yes   Patient/family provided with [facility] Work Department's list of facilities offering this level of care within the geographic area requested by the patient (or if unable, by the patient's family).  Yes   Patient/family informed of their freedom to choose among providers that offer the needed level of care, that participate in Medicare, Medicaid or managed care program needed by the patient, have an available bed and are willing to accept the patient.  Yes   Patient/family informed of [facility]'s ownership interest in [facility] and [facility], as well as of the fact that they are under no obligation to receive care at these facilities.  PASRR submitted to EDS on       PASRR number received on 06/20/17     Existing PASRR number confirmed on       FL2 transmitted to all facilities in geographic area requested by pt/family on 06/19/17     FL2 transmitted to all facilities within larger geographic area on       Patient informed that his/her managed care company has contracts with or will negotiate with certain facilities, including the following:        Yes   Patient/family informed of bed offers received.  Patient chooses bed at [facility]     Physician recommends  and patient chooses bed at      Patient to be transferred to [facility] on 06/21/17.  Patient to be transferred to facility by PTAR     Patient family notified on 06/21/17 of transfer.  Name of family member notified:  CSW attempted to reach patient's brother Marvis Repress), no answer. CSW left voicemail requesting return phone call to provide update.     PHYSICIAN       Additional Comment:    _______________________________________________ Burnis Medin, LCSW 06/21/2017, 11:20 AM

## 2017-06-23 ENCOUNTER — Non-Acute Institutional Stay (SKILLED_NURSING_FACILITY): Payer: Medicare HMO | Admitting: Adult Health

## 2017-06-23 ENCOUNTER — Encounter: Payer: Self-pay | Admitting: Adult Health

## 2017-06-23 ENCOUNTER — Encounter: Payer: Self-pay | Admitting: Pulmonary Disease

## 2017-06-23 ENCOUNTER — Other Ambulatory Visit: Payer: Self-pay

## 2017-06-23 DIAGNOSIS — M159 Polyosteoarthritis, unspecified: Secondary | ICD-10-CM

## 2017-06-23 DIAGNOSIS — M150 Primary generalized (osteo)arthritis: Secondary | ICD-10-CM | POA: Diagnosis not present

## 2017-06-23 DIAGNOSIS — J449 Chronic obstructive pulmonary disease, unspecified: Secondary | ICD-10-CM

## 2017-06-23 DIAGNOSIS — K219 Gastro-esophageal reflux disease without esophagitis: Secondary | ICD-10-CM | POA: Insufficient documentation

## 2017-06-23 DIAGNOSIS — E785 Hyperlipidemia, unspecified: Secondary | ICD-10-CM | POA: Diagnosis not present

## 2017-06-23 DIAGNOSIS — J9611 Chronic respiratory failure with hypoxia: Secondary | ICD-10-CM | POA: Diagnosis not present

## 2017-06-23 DIAGNOSIS — M8949 Other hypertrophic osteoarthropathy, multiple sites: Secondary | ICD-10-CM

## 2017-06-23 DIAGNOSIS — M549 Dorsalgia, unspecified: Secondary | ICD-10-CM

## 2017-06-23 MED ORDER — PREGABALIN 75 MG PO CAPS: 75.0000 mg | ORAL_CAPSULE | Freq: Two times a day (BID) | ORAL | 0 refills | Status: DC

## 2017-06-23 NOTE — Telephone Encounter (Signed)
RX faxed to [facility] @ [phone], phone number [phone] 

## 2017-06-23 NOTE — Progress Notes (Signed)
Location:   [facility] Room Number: 129 A Place of Service:  SNF (31)   CODE STATUS: Full Code  Allergies  Allergen Reactions  . Keflex [Cephalexin] Other (See Comments)    unspecified  . Prednisone Other (See Comments)    "counter reacts"  . Shellfish Allergy Other (See Comments)    Stomach cramps  . Tetracyclines & Related Other (See Comments)    unspecified  . Dilaudid [Hydromorphone Hcl] Other (See Comments)    "Lethargy"  . Incruse Ellipta [Umeclidinium Bromide] Nausea Only  . Tuberculin Tests Other (See Comments)    Chief Complaint  Patient presents with  . Hospitalization Follow-up    Hospital follow up    HPI:  She is a 57 year old who has been hospitalized for a left hip replacement. She is here for short term rehab with her goal to return back home. She denies any changes in appetite; no constipation; no diarrhea. She will continue to be followed for her chronic illnesses including: hypertension; chronic respiratory failure; hypothyroidism. There are no nursing concerns at this time.    Past Medical History:  Diagnosis Date  . Alcoholism and drug addiction in family   . Anxiety   . Arthritis    "knees, hips, lower back" (09/25/2016)  . Asthma   . Bipolar disorder ([place])   . Chronic bronchitis ([place])    "all the time" (09/25/2016)  . Chronic leg pain    RLE  . Chronic lower back pain   . COPD (chronic obstructive pulmonary disease) ([place])   . Depression   . Emphysema of lung ([place])   . GERD (gastroesophageal reflux disease)   . Headache   . Hepatitis C    "treated back in 2001-2002"  . High cholesterol   . Hypertension   . Hypothyroidism   . Incarcerated ventral hernia 04/04/2017  . On home oxygen therapy    "2L; at nighttime" (04/04/2017)  . OSA on CPAP   . Pneumonia    "all the time" (09/25/2016)  . Positive PPD    with negative chest x ray  . Tachycardia    patient reports with exertion ; denies any other conditions   . Thyroid  disease   . Type II diabetes mellitus ([place])     Past Surgical History:  Procedure Laterality Date  . BACK SURGERY    . CARDIAC CATHETERIZATION    . COLONOSCOPY    . CYST EXCISION     removal of cyst in sinuses  . DILATION AND CURETTAGE OF UTERUS    . FOOT SURGERY Bilateral    bone removed from 4th and 5 th toes and put in pin then took pin out  . HERNIA REPAIR    . INCISION AND DRAINAGE  ~ 2014/2015   "removed mesh & infection"  . INCISIONAL HERNIA REPAIR N/A 04/04/2017   Procedure: LAPAROSCOPIC INCISIONAL HERNIA REPAIR WITH MESH;  Surgeon: Fanny Skates, MD;  Location: [facility];  Service: General;  Laterality: N/A;  . LACERATION REPAIR Right    repair wrist artery from laceration from ice skate  . LAPAROSCOPIC INCISIONAL / UMBILICAL / VENTRAL HERNIA REPAIR  04/04/2017   LAPAROSCOPIC INCISIONAL HERNIA REPAIR WITH MESH  . LIPOMA EXCISION Right    fattty lipoma in neck  . NASAL SEPTUM SURGERY    . POSTERIOR LUMBAR FUSION  2015/2016 X 2   "added rods and screws"  . TOTAL HIP ARTHROPLASTY Right 02/05/2017   Procedure: RIGHT TOTAL HIP ARTHROPLASTY;  Surgeon: [name], MD;  Location: [facility];  Service: Orthopedics;  Laterality: Right;  . TOTAL HIP ARTHROPLASTY Left 06/18/2017   Procedure: LEFT TOTAL HIP ARTHROPLASTY;  Surgeon: [name], MD;  Location: [facility];  Service: Orthopedics;  Laterality: Left;  . TUBAL LIGATION    . UMBILICAL HERNIA REPAIR  ~ 2013   w/mesh  . UPPER GI ENDOSCOPY      Social History   Socioeconomic History  . Marital status: Widowed    Spouse name: Not on file  . Number of children: Not on file  . Years of education: Not on file  . Highest education level: Not on file  Occupational History  . Not on file  Social Needs  . Financial resource strain: Not on file  . Food insecurity:    Worry: Not on file    Inability: Not on file  . Transportation needs:    Medical: Not on file    Non-medical: Not on file  Tobacco Use  . Smoking status:  Former Smoker    Packs/day: 1.00    Years: 44.00    Pack years: 44.00    Types: Cigarettes    Last attempt to quit: 11/22/2016    Years since quitting: 0.5  . Smokeless tobacco: Never Used  . Tobacco comment: pt quit smoking 11/22/16 and currently taking chantix  (04/04/2017)  Substance and Sexual Activity  . Alcohol use: Yes    Comment: 04/04/2017 "quit years ago"  . Drug use: Yes    Types: "Crack" cocaine    Comment: 04/04/2017 "nothing since 01/12/2012"  . Sexual activity: Never  Lifestyle  . Physical activity:    Days per week: Not on file    Minutes per session: Not on file  . Stress: Not on file  Relationships  . Social connections:    Talks on phone: Not on file    Gets together: Not on file    Attends religious service: Not on file    Active member of club or organization: Not on file    Attends meetings of clubs or organizations: Not on file    Relationship status: Not on file  . Intimate partner violence:    Fear of current or ex partner: Not on file    Emotionally abused: Not on file    Physically abused: Not on file    Forced sexual activity: Not on file  Other Topics Concern  . Not on file  Social History Narrative  . Not on file   Family History  Problem Relation Age of Onset  . Diabetes Mother   . Hypertension Mother   . Hypertension Father   . Cancer Father        lung      VITAL SIGNS BP (!) 169/89   Pulse 94   Resp 20   Ht 5\' 8"  (1.727 m)   Wt 219 lb (99.3 kg)   SpO2 94%   BMI 33.30 kg/m   Outpatient Encounter Medications as of 06/23/2017  Medication Sig  . acetaminophen (TYLENOL) 325 MG tablet Take 1-2 tablets (325-650 mg total) by mouth every 6 (six) hours as needed for mild pain (pain score 1-3 or temp > 100.5).  Marland Kitchen albuterol (PROVENTIL HFA;VENTOLIN HFA) 108 (90 Base) MCG/ACT inhaler Inhale 2 puffs into the lungs every 6 (six) hours as needed for wheezing or shortness of breath (before activity).  Marland Kitchen albuterol (PROVENTIL) (2.5 MG/3ML) 0.083%  nebulizer solution Take 2.5 mg by nebulization every 6 (six) hours  as needed for wheezing or shortness of breath.  Marland Kitchen atenolol (TENORMIN) 25 MG tablet Take 1 tablet (25 mg total) by mouth daily.  Marland Kitchen atorvastatin (LIPITOR) 20 MG tablet Take 20 mg by mouth daily.  . baclofen (LIORESAL) 10 MG tablet Take 10 mg by mouth at bedtime.   . budesonide-formoterol (SYMBICORT) 160-4.5 MCG/ACT inhaler Inhale 2 puffs into the lungs 2 (two) times daily.  . diphenhydrAMINE (BENADRYL) 25 mg capsule Take 25 mg by mouth 2 (two) times daily.   Marland Kitchen docusate sodium (COLACE) 100 MG capsule Take 1 capsule (100 mg total) by mouth 2 (two) times daily.  Marland Kitchen ipratropium (ATROVENT HFA) 17 MCG/ACT inhaler Inhale 2 puffs into the lungs 4 (four) times daily.  Marland Kitchen levothyroxine (SYNTHROID, LEVOTHROID) 25 MCG tablet Take 1 tablet (25 mcg total) by mouth daily before breakfast.  . Melatonin 5 MG CAPS Take 1 capsule (5 mg total) by mouth at bedtime.  . methocarbamol (ROBAXIN) 500 MG tablet Take 1 tablet (500 mg total) by mouth every 6 (six) hours as needed for muscle spasms.  . montelukast (SINGULAIR) 10 MG tablet Take 1 tablet (10 mg total) by mouth at bedtime.  . naproxen (NAPROSYN) 500 MG tablet Take 500 mg by mouth 2 (two) times daily with a meal.  . nystatin-triamcinolone ointment (MYCOLOG) Apply 1 application topically 2 (two) times daily as needed (applied to skin folds/groins/under breast skin irritation rash.).  Marland Kitchen oxyCODONE (OXY IR/ROXICODONE) 5 MG immediate release tablet Take 1-2 tablets (5-10 mg total) by mouth every 6 (six) hours as needed for severe pain (pain score 4-6).  . pantoprazole (PROTONIX) 40 MG tablet Take 40 mg by mouth 2 (two) times daily.  . polyethylene glycol (MIRALAX / GLYCOLAX) packet Take 17 g by mouth daily as needed for mild constipation.  . pregabalin (LYRICA) 75 MG capsule Take 1 capsule (75 mg total) by mouth 2 (two) times daily.  . roflumilast (DALIRESP) 500 MCG TABS tablet Take 1 tablet (500 mcg total)  by mouth daily.  . traMADol (ULTRAM) 50 MG tablet Take 1 tablet (50 mg total) by mouth every 6 (six) hours as needed for moderate pain.  . valACYclovir (VALTREX) 500 MG tablet Take 500 mg by mouth daily.   . varenicline (CHANTIX CONTINUING MONTH PAK) 1 MG tablet Take 1 tablet (1 mg total) by mouth 2 (two) times daily.  Marland Kitchen venlafaxine XR (EFFEXOR-XR) 150 MG 24 hr capsule Take 1 capsule (150 mg total) by mouth daily with breakfast.  . [DISCONTINUED] ipratropium (ATROVENT HFA) 17 MCG/ACT inhaler Inhale 2 puffs into the lungs 4 (four) times daily. (Patient not taking: Reported on 06/23/2017)  . [DISCONTINUED] naproxen (NAPROSYN) 500 MG tablet Take 1 tablet (500 mg total) by mouth 2 (two) times daily with a meal. (Patient not taking: Reported on 06/23/2017)   No facility-administered encounter medications on file as of 06/23/2017.      SIGNIFICANT DIAGNOSTIC EXAMS  PREVIOUS:   02-06-17: right hip x-ray:  No appreciable change from 1 day prior. Prosthetic components appear well seated on frontal view. No evident fracture or dislocation.  TODAY:  06-18-17: left hip fracture: No immediate postprocedure complication following left total hip joint prosthesis placement.     LABS REVIEWED PREVIOUS:   09-27-16: free T4: 0.72 02-03-17: hgb a1c 6.0 02-07-17: wbc 16.3; hgb 11.6; hct 34.9; mcv 95.6; plt 223; glucose 171; bun 8; creat 0.75; k+ 4.0; na++ 134; ca 8.5  TODAY:   06-20-17: wbc 14.1; hgb 9.9; hct 30.9; mcv 94.8; plt 240;  glucose 129; bun 15; creat 0.92; k+ 3.7; na++ 139; ca 8.5  06-21-17: wbc 12.9; hgb 9.3; hct 29.0; mcv 94.5; plt 239    Review of Systems  Constitutional: Negative for malaise/fatigue.  Respiratory: Negative for cough and shortness of breath.   Cardiovascular: Negative for chest pain, palpitations and leg swelling.  Gastrointestinal: Negative for abdominal pain, constipation and heartburn.  Musculoskeletal: Positive for joint pain. Negative for back pain and myalgias.        Left knee  Left hip pain   Skin: Negative.   Neurological: Negative for dizziness.  Psychiatric/Behavioral: The patient is not nervous/anxious.     Physical Exam  Constitutional: She is oriented to person, place, and time. She appears well-developed and well-nourished. No distress.  Obese   Neck: No thyromegaly present.  Cardiovascular: Normal rate, regular rhythm, normal heart sounds and intact distal pulses.  Pulmonary/Chest: Effort normal and breath sounds normal. No respiratory distress.  Abdominal: Soft. Bowel sounds are normal. She exhibits no distension. There is no tenderness.  Musculoskeletal: She exhibits no edema.  Is able to move all extremities Is status post left hip replacement   Lymphadenopathy:    She has no cervical adenopathy.  Neurological: She is alert and oriented to person, place, and time.  Skin: Skin is warm and dry. She is not diaphoretic.  Incision line without signs of infection present   Psychiatric: She has a normal mood and affect.     ASSESSMENT/ PLAN:  TODAY:   1. Essential hypertension: stable will continue tenormin 25 mg daily  2. COPD/chronic respiratory failure with hypoxia: stable is on 02. Will conintue symbicort 145/4.5 mcg 2 puffs twice daily; atrovent 2 puffs four times daily; daliresp 500 mg daily; singulair 10 mg daily and albuterol 2 puffs or neb every 6 hours as needed is taking benadryl 25 mg twice daily   3. Hypothyroidism due to acquired atrophy of thyroid: stable will continue synthroid 25 mcg daily   4. Dyslipidemia: stable will continue lipitor 20 mg daily   5. GERD without esophagitis: stable will continue protonix 40 mg twice daily   6. Primary osteoarthritis involving multiple joints: is status post left hip replacement. Will continue therapy as directed and will follow up with orthopedics. Will continue naaporxen 500 mg twice daily baclofen 10 mg nightly  has ultram 50 mg every 6 hours as needed and has oxycodone 5 mg  or 10 mg every 6 hours as needed  7.  Chronic back pain: is stable will continue lyrica 75 mg twice daily   8. Anxiety: stable will continue effexor xr 150 mg daily   9. Current smoker: will continue chantix 1 mg twice daily   MD is aware of resident's narcotic use and is in agreement with current plan of care. We will attempt to wean resident as apropriate   Ok Edwards NP [facility]  Contact [phone] Monday through Friday 8am- 5pm  After hours call [phone]

## 2017-06-24 ENCOUNTER — Encounter: Payer: Self-pay | Admitting: Adult Health

## 2017-06-24 NOTE — Progress Notes (Signed)
Entered in error

## 2017-06-25 ENCOUNTER — Other Ambulatory Visit: Payer: Self-pay

## 2017-06-25 MED ORDER — OXYCODONE HCL 5 MG PO TABS: ORAL_TABLET | ORAL | 0 refills | Status: DC

## 2017-06-25 MED ORDER — TRAMADOL HCL 50 MG PO TABS: 50.0000 mg | ORAL_TABLET | Freq: Four times a day (QID) | ORAL | 0 refills | Status: DC | PRN

## 2017-06-25 NOTE — Telephone Encounter (Signed)
rx faxed to [facility] (p) [phone] (f) [phone] 

## 2017-06-26 ENCOUNTER — Encounter: Payer: Self-pay | Admitting: Internal Medicine

## 2017-06-26 ENCOUNTER — Non-Acute Institutional Stay (SKILLED_NURSING_FACILITY): Payer: Medicare HMO | Admitting: Internal Medicine

## 2017-06-26 DIAGNOSIS — D649 Anemia, unspecified: Secondary | ICD-10-CM

## 2017-06-26 DIAGNOSIS — Z96642 Presence of left artificial hip joint: Secondary | ICD-10-CM

## 2017-06-26 DIAGNOSIS — F419 Anxiety disorder, unspecified: Secondary | ICD-10-CM

## 2017-06-26 DIAGNOSIS — M159 Polyosteoarthritis, unspecified: Secondary | ICD-10-CM

## 2017-06-26 DIAGNOSIS — M150 Primary generalized (osteo)arthritis: Secondary | ICD-10-CM | POA: Diagnosis not present

## 2017-06-26 DIAGNOSIS — E034 Atrophy of thyroid (acquired): Secondary | ICD-10-CM | POA: Diagnosis not present

## 2017-06-26 DIAGNOSIS — R7303 Prediabetes: Secondary | ICD-10-CM | POA: Diagnosis not present

## 2017-06-26 DIAGNOSIS — I10 Essential (primary) hypertension: Secondary | ICD-10-CM | POA: Diagnosis not present

## 2017-06-26 DIAGNOSIS — J449 Chronic obstructive pulmonary disease, unspecified: Secondary | ICD-10-CM

## 2017-06-26 DIAGNOSIS — M8949 Other hypertrophic osteoarthropathy, multiple sites: Secondary | ICD-10-CM

## 2017-06-26 DIAGNOSIS — J9611 Chronic respiratory failure with hypoxia: Secondary | ICD-10-CM | POA: Diagnosis not present

## 2017-06-26 DIAGNOSIS — R69 Illness, unspecified: Secondary | ICD-10-CM | POA: Diagnosis not present

## 2017-06-26 NOTE — Progress Notes (Signed)
Patient ID: Joan [name], female   DOB: [birth year], 57 y.o.   MRN: [id number]  Provider:  DR Elmon Kirschner Location:  [facility] Room Number: 129 A Place of Service:  SNF (31)  PCP: Myrlene Broker, MD Patient Care Team: Myrlene Broker, MD as PCP - General (Internal Medicine)  Extended Emergency Contact Information Primary Emergency Contact: [facility] Address: [address]          [place], [place] [address] [place] Home Phone: [phone] Relation: Brother  Code Status: Full Code Goals of Care: Advanced Directive information Advanced Directives 06/26/2017  Does Patient Have a Medical Advance Directive? No  Type of Advance Directive -  Does patient want to make changes to medical advance directive? No - Patient declined  Copy of Healthcare Power of Attorney in Chart? -  Would patient like information on creating a medical advance directive? No - Patient declined      Chief Complaint  Patient presents with  . New Admit To SNF    Admission    HPI: Patient is a 57 y.o. female seen today for admission to SNF following hospital stay for left hip OA. She underwent left THA by Dr Darrelyn Hillock 06/18/17. She rec'd perioperative IV abx vanco. Xeralto given for DVT prophylaxis. Hgb 9.9 at d/c. She presents to SNF for short term rehab.  Today she reports pain is stable on naproxen, robaxin and lidocaine patch. She did not receive OxyIR yet and states she prefers not to take it as she is a recovering addict. She has not needed tramadol. Appetite ok and sleeps well. She has neuropathy and takes lyrica BID. She has appt to f/u with Ortho next week. A1c 6.1%    Past Medical History:  Diagnosis Date  . Alcoholism and drug addiction in family   . Anxiety   . Arthritis    "knees, hips, lower back" (09/25/2016)  . Asthma   . Bipolar disorder (HCC)   . Chronic bronchitis (HCC)    "all the time" (09/25/2016)  . Chronic leg pain    RLE  .  Chronic lower back pain   . COPD (chronic obstructive pulmonary disease) (HCC)   . Depression   . Emphysema of lung (HCC)   . GERD (gastroesophageal reflux disease)   . Headache   . Hepatitis C    "treated back in 2001-2002"  . High cholesterol   . Hypertension   . Hypothyroidism   . Incarcerated ventral hernia 04/04/2017  . On home oxygen therapy    "2L; at nighttime" (04/04/2017)  . OSA on CPAP   . Pneumonia    "all the time" (09/25/2016)  . Positive PPD    with negative chest x ray  . Tachycardia    patient reports with exertion ; denies any other conditions   . Thyroid disease   . Type II diabetes mellitus (HCC)    Past Surgical History:  Procedure Laterality Date  . BACK SURGERY    . CARDIAC CATHETERIZATION    . COLONOSCOPY    . CYST EXCISION     removal of cyst in sinuses  . DILATION AND CURETTAGE OF UTERUS    . FOOT SURGERY Bilateral    bone removed from 4th and 5 th toes and put in pin then took pin out  . HERNIA REPAIR    . INCISION AND DRAINAGE  ~ 2014/2015   "removed mesh & infection"  . INCISIONAL HERNIA REPAIR N/A 04/04/2017  Procedure: LAPAROSCOPIC INCISIONAL HERNIA REPAIR WITH MESH;  Surgeon: Claud Kelp, MD;  Location: [facility] OR;  Service: General;  Laterality: N/A;  . LACERATION REPAIR Right    repair wrist artery from laceration from ice skate  . LAPAROSCOPIC INCISIONAL / UMBILICAL / VENTRAL HERNIA REPAIR  04/04/2017   LAPAROSCOPIC INCISIONAL HERNIA REPAIR WITH MESH  . LIPOMA EXCISION Right    fattty lipoma in neck  . NASAL SEPTUM SURGERY    . POSTERIOR LUMBAR FUSION  2015/2016 X 2   "added rods and screws"  . TOTAL HIP ARTHROPLASTY Right 02/05/2017   Procedure: RIGHT TOTAL HIP ARTHROPLASTY;  Surgeon: Ranee Gosselin, MD;  Location: [facility] ORS;  Service: Orthopedics;  Laterality: Right;  . TOTAL HIP ARTHROPLASTY Left 06/18/2017   Procedure: LEFT TOTAL HIP ARTHROPLASTY;  Surgeon: Ranee Gosselin, MD;  Location: [facility] ORS;  Service: Orthopedics;  Laterality: Left;   . TUBAL LIGATION    . UMBILICAL HERNIA REPAIR  ~ 2013   w/mesh  . UPPER GI ENDOSCOPY      reports that she quit smoking about 7 months ago. Her smoking use included cigarettes. She has a 44.00 pack-year smoking history. She has never used smokeless tobacco. She reports that she drinks alcohol. She reports that she has current or past drug history. Drug: "Crack" cocaine. Social History   Socioeconomic History  . Marital status: Widowed    Spouse name: Not on file  . Number of children: Not on file  . Years of education: Not on file  . Highest education level: Not on file  Occupational History  . Not on file  Social Needs  . Financial resource strain: Not on file  . Food insecurity:    Worry: Not on file    Inability: Not on file  . Transportation needs:    Medical: Not on file    Non-medical: Not on file  Tobacco Use  . Smoking status: Former Smoker    Packs/day: 1.00    Years: 44.00    Pack years: 44.00    Types: Cigarettes    Last attempt to quit: 11/22/2016    Years since quitting: 0.5  . Smokeless tobacco: Never Used  . Tobacco comment: pt quit smoking 11/22/16 and currently taking chantix  (04/04/2017)  Substance and Sexual Activity  . Alcohol use: Yes    Comment: 04/04/2017 "quit years ago"  . Drug use: Yes    Types: "Crack" cocaine    Comment: 04/04/2017 "nothing since 01/12/2012"  . Sexual activity: Never  Lifestyle  . Physical activity:    Days per week: Not on file    Minutes per session: Not on file  . Stress: Not on file  Relationships  . Social connections:    Talks on phone: Not on file    Gets together: Not on file    Attends religious service: Not on file    Active member of club or organization: Not on file    Attends meetings of clubs or organizations: Not on file    Relationship status: Not on file  . Intimate partner violence:    Fear of current or ex partner: Not on file    Emotionally abused: Not on file    Physically abused: Not on file     Forced sexual activity: Not on file  Other Topics Concern  . Not on file  Social History Narrative  . Not on file    Functional Status Survey:    Family History  Problem Relation Age of  Onset  . Diabetes Mother   . Hypertension Mother   . Hypertension Father   . Cancer Father        lung    Health Maintenance  Topic Date Due  . PNEUMOCOCCAL POLYSACCHARIDE VACCINE (1) 02/10/2018 (Originally 11/02/1963)  . MAMMOGRAM  02/10/2018 (Originally 11/02/2011)  . PAP SMEAR  02/10/2018 (Originally 11/02/1982)  . OPHTHALMOLOGY EXAM  02/10/2018 (Originally 11/02/1971)  . URINE MICROALBUMIN  02/10/2018 (Originally 11/02/1971)  . COLONOSCOPY  02/10/2018 (Originally 11/02/2011)  . TETANUS/TDAP  02/10/2018 (Originally 11/01/1980)  . INFLUENZA VACCINE  09/25/2017  . FOOT EXAM  11/29/2017  . HEMOGLOBIN A1C  12/11/2017  . Hepatitis C Screening  Completed  . HIV Screening  Completed    Allergies  Allergen Reactions  . Keflex [Cephalexin] Other (See Comments)    unspecified  . Prednisone Other (See Comments)    "counter reacts"  . Shellfish Allergy Other (See Comments)    Stomach cramps  . Tetracyclines & Related Other (See Comments)    unspecified  . Dilaudid [Hydromorphone Hcl] Other (See Comments)    "Lethargy"  . Incruse Ellipta [Umeclidinium Bromide] Nausea Only  . Tuberculin Tests Other (See Comments)    Outpatient Encounter Medications as of 06/26/2017  Medication Sig  . acetaminophen (TYLENOL) 325 MG tablet Take 1-2 tablets (325-650 mg total) by mouth every 6 (six) hours as needed for mild pain (pain score 1-3 or temp > 100.5).  Marland Kitchen albuterol (PROVENTIL HFA;VENTOLIN HFA) 108 (90 Base) MCG/ACT inhaler Inhale 2 puffs into the lungs every 6 (six) hours as needed for wheezing or shortness of breath (before activity).  Marland Kitchen albuterol (PROVENTIL) (2.5 MG/3ML) 0.083% nebulizer solution Take 2.5 mg by nebulization 2 (two) times daily. And every 6 hours as needed  . atenolol (TENORMIN) 25 MG tablet Take 1  tablet (25 mg total) by mouth daily.  Marland Kitchen atorvastatin (LIPITOR) 20 MG tablet Take 20 mg by mouth daily.  . baclofen (LIORESAL) 10 MG tablet Take 10 mg by mouth at bedtime.   . budesonide-formoterol (SYMBICORT) 160-4.5 MCG/ACT inhaler Inhale 2 puffs into the lungs 2 (two) times daily.  . diphenhydrAMINE (BENADRYL) 25 mg capsule Take 25 mg by mouth 2 (two) times daily.   Marland Kitchen docusate sodium (COLACE) 100 MG capsule Take 1 capsule (100 mg total) by mouth 2 (two) times daily.  Marland Kitchen ipratropium (ATROVENT HFA) 17 MCG/ACT inhaler Inhale 2 puffs into the lungs 4 (four) times daily.  Marland Kitchen levothyroxine (SYNTHROID, LEVOTHROID) 25 MCG tablet Take 1 tablet (25 mcg total) by mouth daily before breakfast.  . Lidocaine 4 % PTCH Apply to left knee topically one time a day for pain management  . Melatonin 5 MG CAPS Take 1 capsule (5 mg total) by mouth at bedtime.  . methocarbamol (ROBAXIN) 500 MG tablet Take 1 tablet (500 mg total) by mouth every 6 (six) hours as needed for muscle spasms.  . montelukast (SINGULAIR) 10 MG tablet Take 1 tablet (10 mg total) by mouth at bedtime.  . naproxen (NAPROSYN) 500 MG tablet Take 500 mg by mouth 2 (two) times daily with a meal.  . Nutritional Supplements (NUTRITIONAL SUPPLEMENT PO) NAS (No Salt Added) diet - Regular texture, thin liquids consistency  . nystatin-triamcinolone ointment (MYCOLOG) Apply 1 application topically 2 (two) times daily as needed (applied to skin folds/groins/under breast skin irritation rash.).  Marland Kitchen oxyCODONE (OXY IR/ROXICODONE) 5 MG immediate release tablet Take 1 tablet by mouth for moderate pain or 2 tablets by mouth for severe pain every 6 hours  prn  . pantoprazole (PROTONIX) 40 MG tablet Take 40 mg by mouth 2 (two) times daily.  . polyethylene glycol (MIRALAX / GLYCOLAX) packet Take 17 g by mouth daily as needed for mild constipation.  . pregabalin (LYRICA) 75 MG capsule Take 1 capsule (75 mg total) by mouth 2 (two) times daily.  . roflumilast (DALIRESP) 500  MCG TABS tablet Take 1 tablet (500 mcg total) by mouth daily.  . traMADol (ULTRAM) 50 MG tablet Take 1 tablet (50 mg total) by mouth every 6 (six) hours as needed for moderate pain.  . valACYclovir (VALTREX) 500 MG tablet Take 500 mg by mouth daily.   . varenicline (CHANTIX CONTINUING MONTH PAK) 1 MG tablet Take 1 tablet (1 mg total) by mouth 2 (two) times daily.  Marland Kitchen venlafaxine XR (EFFEXOR-XR) 150 MG 24 hr capsule Take 1 capsule (150 mg total) by mouth daily with breakfast.   No facility-administered encounter medications on file as of 06/26/2017.     Review of Systems  Musculoskeletal: Positive for gait problem and joint swelling (left hip).  Neurological: Positive for numbness.  All other systems reviewed and are negative.   Vitals:   06/26/17 0911  BP: (!) 139/97  Pulse: 83  Resp: 20  Temp: (!) 97.3 F (36.3 C)  SpO2: 96%  Weight: 219 lb (99.3 kg)  Height: 5\' 8"  (1.727 m)   Body mass index is 33.3 kg/m. Physical Exam  Constitutional: She is oriented to person, place, and time. She appears well-developed and well-nourished.  Looks well in NAD, sitting in w/c. No conversational dyspnea  HENT:  Mouth/Throat: Oropharynx is clear and moist. No oropharyngeal exudate.  Eyes: Pupils are equal, round, and reactive to light. No scleral icterus.  Neck: Neck supple. Carotid bruit is not present. No tracheal deviation present. No thyromegaly present.  Cardiovascular: Normal rate, regular rhythm and intact distal pulses. Exam reveals no gallop and no friction rub.  Murmur (1/6 SEM) heard. Trace LLE edema but no RLE edema. No calf TTP  Pulmonary/Chest: Effort normal and breath sounds normal. No stridor. No respiratory distress. She has no wheezes. She has no rales.  Abdominal: Soft. Bowel sounds are normal. She exhibits no distension and no mass. There is no hepatomegaly. There is no tenderness. There is no rebound and no guarding.  obese  Lymphadenopathy:    She has no cervical  adenopathy.  Neurological: She is alert and oriented to person, place, and time. She has normal reflexes.  Skin: Skin is warm and dry. No rash noted.  Tattoos present; left posterior lateral thigh dsg dirty but intact  Psychiatric: She has a normal mood and affect. Her behavior is normal. Judgment and thought content normal.    Labs reviewed: Basic Metabolic Panel: Recent Labs    06/02/17 2219 06/19/17 0534 06/20/17 0600  NA 141 141 139  K 4.2 3.7 3.7  CL 108 105 104  CO2 26 26 24   GLUCOSE 121* 134* 129*  BUN 15 16 15   CREATININE 1.13* 1.00 0.92  CALCIUM 9.0 8.5* 8.5*   Liver Function Tests: Recent Labs    02/03/17 1201 04/04/17 0641 06/02/17 2219  AST 27 29 15   ALT 34 18 16  ALKPHOS 135* 118 130*  BILITOT 0.5 0.7 0.5  PROT 7.3 6.1* 7.1  ALBUMIN 4.3 3.8 4.0   Recent Labs    06/02/17 2219  LIPASE 37   No results for input(s): AMMONIA in the last 8760 hours. CBC: Recent Labs    02/03/17 1201  02/11/17 04/04/17 0641  06/19/17 0534 06/20/17 0600 06/21/17 0507  WBC 12.2*   < > 8.1 10.3   < > 15.9* 14.1* 12.9*  NEUTROABS 7.9*  --  5 5.7  --   --   --   --   HGB 14.0   < > 9.5* 12.4   < > 10.3* 9.9* 9.3*  HCT 40.9   < > 28* 38.3   < > 31.7* 30.9* 29.0*  MCV 94.2   < >  --  96.5   < > 94.9 94.8 94.5  PLT 259   < > 271 254   < > 281 240 239   < > = values in this interval not displayed.   Cardiac Enzymes: No results for input(s): CKTOTAL, CKMB, CKMBINDEX, TROPONINI in the last 8760 hours. BNP: Invalid input(s): POCBNP Lab Results  Component Value Date   HGBA1C 6.1 (H) 06/11/2017   Lab Results  Component Value Date   TSH 0.107 (L) 09/26/2016   No results found for: VITAMINB12 No results found for: FOLATE No results found for: IRON, TIBC, FERRITIN  Imaging and Procedures obtained prior to SNF admission: No results found.  Assessment/Plan   ICD-10-CM   1. History of total hip arthroplasty, left Z96.642   2. Primary osteoarthritis involving multiple  joints M15.0   3. Chronic respiratory failure with hypoxia (HCC) J96.11   4. COPD, severe (HCC) J44.9   5. Essential hypertension I10   6. Prediabetes R73.03   7. Hypothyroidism due to acquired atrophy of thyroid E03.4   8. Anxiety F41.9   9. Anemia, unspecified type D64.9      D/c OxyIR and tramadol as she is not taking them  Cont other meds as ordered  F/u with Ortho as scheduled next week  Cont PT/OT as ordered  GOAL: short term rehab and d/c home when medically appropriate. Communicated with pt and nursing.  Labs/tests ordered: none    Nedim Oki S. Ancil Linsey  [facility] [address] [place], [place] [address] [phone] Cell (Monday-Friday 8 AM - 5 PM) [phone] After 5 PM and follow prompts

## 2017-06-30 ENCOUNTER — Non-Acute Institutional Stay (SKILLED_NURSING_FACILITY): Payer: Medicare HMO | Admitting: Adult Health

## 2017-06-30 ENCOUNTER — Encounter: Payer: Self-pay | Admitting: Adult Health

## 2017-06-30 DIAGNOSIS — E034 Atrophy of thyroid (acquired): Secondary | ICD-10-CM | POA: Diagnosis not present

## 2017-06-30 DIAGNOSIS — I10 Essential (primary) hypertension: Secondary | ICD-10-CM | POA: Diagnosis not present

## 2017-06-30 DIAGNOSIS — J449 Chronic obstructive pulmonary disease, unspecified: Secondary | ICD-10-CM

## 2017-06-30 DIAGNOSIS — J9611 Chronic respiratory failure with hypoxia: Secondary | ICD-10-CM | POA: Diagnosis not present

## 2017-06-30 DIAGNOSIS — E785 Hyperlipidemia, unspecified: Secondary | ICD-10-CM

## 2017-06-30 NOTE — Progress Notes (Signed)
Location:   [facility] Room Number: 129 A Place of Service:  SNF (31)   CODE STATUS: Full Code  Allergies  Allergen Reactions  . Keflex [Cephalexin] Other (See Comments)    unspecified  . Prednisone Other (See Comments)    "counter reacts"  . Shellfish Allergy Other (See Comments)    Stomach cramps  . Tetracyclines & Related Other (See Comments)    unspecified  . Dilaudid [Hydromorphone Hcl] Other (See Comments)    "Lethargy"  . Incruse Ellipta [Umeclidinium Bromide] Nausea Only  . Tuberculin Tests Other (See Comments)    Chief Complaint  Patient presents with  . Medical Management of Chronic Issues    Hypertension; copd; dyslipidemia; hypothyroidism weekly follow up for the first 30 days post hospitalization     HPI:  She is a 57 year old short term rehab patient being seen for the management of her chronic illnesses: hypertension; copd; dyslipidemia hypothyroidism. She continues to work with therapy and is doing well. She denies any worsening shortness of breath no cough; no pain. There are no nursing concerns at this time.   Past Medical History:  Diagnosis Date  . Alcoholism and drug addiction in family   . Anxiety   . Arthritis    "knees, hips, lower back" (09/25/2016)  . Asthma   . Bipolar disorder ([place])   . Chronic bronchitis ([place])    "all the time" (09/25/2016)  . Chronic leg pain    RLE  . Chronic lower back pain   . COPD (chronic obstructive pulmonary disease) ([place])   . Depression   . Emphysema of lung ([place])   . GERD (gastroesophageal reflux disease)   . Headache   . Hepatitis C    "treated back in 2001-2002"  . High cholesterol   . Hypertension   . Hypothyroidism   . Incarcerated ventral hernia 04/04/2017  . On home oxygen therapy    "2L; at nighttime" (04/04/2017)  . OSA on CPAP   . Pneumonia    "all the time" (09/25/2016)  . Positive PPD    with negative chest x ray  . Tachycardia    patient reports with exertion ; denies any  other conditions   . Thyroid disease   . Type II diabetes mellitus ([place])     Past Surgical History:  Procedure Laterality Date  . BACK SURGERY    . CARDIAC CATHETERIZATION    . COLONOSCOPY    . CYST EXCISION     removal of cyst in sinuses  . DILATION AND CURETTAGE OF UTERUS    . FOOT SURGERY Bilateral    bone removed from 4th and 5 th toes and put in pin then took pin out  . HERNIA REPAIR    . INCISION AND DRAINAGE  ~ 2014/2015   "removed mesh & infection"  . INCISIONAL HERNIA REPAIR N/A 04/04/2017   Procedure: LAPAROSCOPIC INCISIONAL HERNIA REPAIR WITH MESH;  Surgeon: Fanny Skates, MD;  Location: [place];  Service: General;  Laterality: N/A;  . LACERATION REPAIR Right    repair wrist artery from laceration from ice skate  . LAPAROSCOPIC INCISIONAL / UMBILICAL / VENTRAL HERNIA REPAIR  04/04/2017   LAPAROSCOPIC INCISIONAL HERNIA REPAIR WITH MESH  . LIPOMA EXCISION Right    fattty lipoma in neck  . NASAL SEPTUM SURGERY    . POSTERIOR LUMBAR FUSION  2015/2016 X 2   "added rods and screws"  . TOTAL HIP ARTHROPLASTY Right 02/05/2017   Procedure: RIGHT TOTAL  HIP ARTHROPLASTY;  Surgeon: [name], MD;  Location: [facility];  Service: Orthopedics;  Laterality: Right;  . TOTAL HIP ARTHROPLASTY Left 06/18/2017   Procedure: LEFT TOTAL HIP ARTHROPLASTY;  Surgeon: [name], MD;  Location: [facility];  Service: Orthopedics;  Laterality: Left;  . TUBAL LIGATION    . UMBILICAL HERNIA REPAIR  ~ 2013   w/mesh  . UPPER GI ENDOSCOPY      Social History   Socioeconomic History  . Marital status: Widowed    Spouse name: Not on file  . Number of children: Not on file  . Years of education: Not on file  . Highest education level: Not on file  Occupational History  . Not on file  Social Needs  . Financial resource strain: Not on file  . Food insecurity:    Worry: Not on file    Inability: Not on file  . Transportation needs:    Medical: Not on file    Non-medical: Not on file   Tobacco Use  . Smoking status: Former Smoker    Packs/day: 1.00    Years: 44.00    Pack years: 44.00    Types: Cigarettes    Last attempt to quit: 11/22/2016    Years since quitting: 0.6  . Smokeless tobacco: Never Used  . Tobacco comment: pt quit smoking 11/22/16 and currently taking chantix  (04/04/2017)  Substance and Sexual Activity  . Alcohol use: Yes    Comment: 04/04/2017 "quit years ago"  . Drug use: Yes    Types: "Crack" cocaine    Comment: 04/04/2017 "nothing since 01/12/2012"  . Sexual activity: Never  Lifestyle  . Physical activity:    Days per week: Not on file    Minutes per session: Not on file  . Stress: Not on file  Relationships  . Social connections:    Talks on phone: Not on file    Gets together: Not on file    Attends religious service: Not on file    Active member of club or organization: Not on file    Attends meetings of clubs or organizations: Not on file    Relationship status: Not on file  . Intimate partner violence:    Fear of current or ex partner: Not on file    Emotionally abused: Not on file    Physically abused: Not on file    Forced sexual activity: Not on file  Other Topics Concern  . Not on file  Social History Narrative  . Not on file   Family History  Problem Relation Age of Onset  . Diabetes Mother   . Hypertension Mother   . Hypertension Father   . Cancer Father        lung      VITAL SIGNS BP 132/78   Pulse 81   Temp (!) 97 F (36.1 C)   Resp 18   Ht 5\' 8"  (1.727 m)   Wt 219 lb (99.3 kg)   SpO2 98%   BMI 33.30 kg/m   Outpatient Encounter Medications as of 06/30/2017  Medication Sig  . acetaminophen (TYLENOL) 325 MG tablet Take 1-2 tablets (325-650 mg total) by mouth every 6 (six) hours as needed for mild pain (pain score 1-3 or temp > 100.5).  Marland Kitchen albuterol (PROVENTIL HFA;VENTOLIN HFA) 108 (90 Base) MCG/ACT inhaler Inhale 2 puffs into the lungs every 6 (six) hours as needed for wheezing or shortness of breath (before  activity).  Marland Kitchen albuterol (PROVENTIL) (2.5 MG/3ML) 0.083% nebulizer  solution Take 2.5 mg by nebulization 2 (two) times daily. And every 6 hours as needed  . atenolol (TENORMIN) 25 MG tablet Take 1 tablet (25 mg total) by mouth daily.  Marland Kitchen atorvastatin (LIPITOR) 20 MG tablet Take 20 mg by mouth daily.  . baclofen (LIORESAL) 10 MG tablet Take 10 mg by mouth at bedtime.   . diphenhydrAMINE (BENADRYL) 25 mg capsule Take 25 mg by mouth 2 (two) times daily.   Marland Kitchen docusate sodium (COLACE) 100 MG capsule Take 1 capsule (100 mg total) by mouth 2 (two) times daily.  Marland Kitchen levothyroxine (SYNTHROID, LEVOTHROID) 25 MCG tablet Take 1 tablet (25 mcg total) by mouth daily before breakfast.  . Lidocaine 4 % PTCH Apply to left knee topically one time a day for pain management  . Melatonin 5 MG CAPS Take 1 capsule (5 mg total) by mouth at bedtime.  . methocarbamol (ROBAXIN) 500 MG tablet Take 1 tablet (500 mg total) by mouth every 6 (six) hours as needed for muscle spasms.  . mometasone-formoterol (DULERA) 200-5 MCG/ACT AERO Inhale 2 puffs into the lungs 2 (two) times daily.  . montelukast (SINGULAIR) 10 MG tablet Take 1 tablet (10 mg total) by mouth at bedtime.  . naproxen (NAPROSYN) 500 MG tablet Take 500 mg by mouth 2 (two) times daily with a meal.  . Nutritional Supplements (NUTRITIONAL SUPPLEMENT PO) NAS (No Salt Added) diet - Regular texture, thin liquids consistency  . nystatin-triamcinolone ointment (MYCOLOG) Apply 1 application topically 2 (two) times daily as needed (applied to skin folds/groins/under breast skin irritation rash.).  Marland Kitchen pantoprazole (PROTONIX) 40 MG tablet Take 40 mg by mouth 2 (two) times daily.  . polyethylene glycol (MIRALAX / GLYCOLAX) packet Take 17 g by mouth daily as needed for mild constipation.  . pregabalin (LYRICA) 75 MG capsule Take 1 capsule (75 mg total) by mouth 2 (two) times daily.  . roflumilast (DALIRESP) 500 MCG TABS tablet Take 1 tablet (500 mcg total) by mouth daily.  .  valACYclovir (VALTREX) 500 MG tablet Take 500 mg by mouth daily.   . varenicline (CHANTIX CONTINUING MONTH PAK) 1 MG tablet Take 1 tablet (1 mg total) by mouth 2 (two) times daily.  Marland Kitchen venlafaxine XR (EFFEXOR-XR) 150 MG 24 hr capsule Take 1 capsule (150 mg total) by mouth daily with breakfast.  . [DISCONTINUED] budesonide-formoterol (SYMBICORT) 160-4.5 MCG/ACT inhaler Inhale 2 puffs into the lungs 2 (two) times daily. (Patient not taking: Reported on 06/30/2017)  . [DISCONTINUED] ipratropium (ATROVENT HFA) 17 MCG/ACT inhaler Inhale 2 puffs into the lungs 4 (four) times daily.  . [DISCONTINUED] oxyCODONE (OXY IR/ROXICODONE) 5 MG immediate release tablet Take 1 tablet by mouth for moderate pain or 2 tablets by mouth for severe pain every 6 hours prn (Patient not taking: Reported on 06/30/2017)  . [DISCONTINUED] traMADol (ULTRAM) 50 MG tablet Take 1 tablet (50 mg total) by mouth every 6 (six) hours as needed for moderate pain. (Patient not taking: Reported on 06/30/2017)   No facility-administered encounter medications on file as of 06/30/2017.      SIGNIFICANT DIAGNOSTIC EXAMS  PREVIOUS:   02-06-17: right hip x-ray:  No appreciable change from 1 day prior. Prosthetic components appear well seated on frontal view. No evident fracture or dislocation.  06-18-17: left hip x-ray: No immediate postprocedure complication following left total hip joint prosthesis placement.   NO NEW EXAMS     LABS REVIEWED PREVIOUS:   09-27-16: free T4: 0.72 02-03-17: hgb a1c 6.0 02-07-17: wbc 16.3; hgb 11.6; hct  34.9; mcv 95.6; plt 223; glucose 171; bun 8; creat 0.75; k+ 4.0; na++ 134; ca 8.5 06-20-17: wbc 14.1; hgb 9.9; hct 30.9; mcv 94.8; plt 240; glucose 129; bun 15; creat 0.92; k+ 3.7; na++ 139; ca 8.5  06-21-17: wbc 12.9; hgb 9.3; hct 29.0; mcv 94.5; plt 239   NO NEW LABS.    Review of Systems  Constitutional: Negative for malaise/fatigue.  Respiratory: Negative for cough and shortness of breath.    Cardiovascular: Negative for chest pain, palpitations and leg swelling.  Gastrointestinal: Negative for abdominal pain, constipation and heartburn.  Musculoskeletal: Negative for back pain, joint pain and myalgias.  Skin: Negative.   Neurological: Negative for dizziness.  Psychiatric/Behavioral: The patient is not nervous/anxious.     Physical Exam  Constitutional: She is oriented to person, place, and time. She appears well-developed and well-nourished. No distress.  obese  Neck: No thyromegaly present.  Cardiovascular: Normal rate, regular rhythm, normal heart sounds and intact distal pulses.  Pulmonary/Chest: Effort normal and breath sounds normal. No respiratory distress.  Abdominal: Soft. Bowel sounds are normal. She exhibits no distension. There is no tenderness.  Musculoskeletal: She exhibits no edema.  Is able to move all extremities Is status post left hip replacement    Lymphadenopathy:    She has no cervical adenopathy.  Neurological: She is alert and oriented to person, place, and time.  Skin: Skin is warm and dry. She is not diaphoretic.  Psychiatric: She has a normal mood and affect.     ASSESSMENT/ PLAN:  TODAY:   1. Essential hypertension: stable b/p 132/78 will continue tenormin 25 mg daily  2. COPD/chronic respiratory failure with hypoxia: stable is on 02. Will continue dulera daily daliresp 500 mg daily; singulair 10 mg daily and albuterol neb twice daily and every 6 hours as needed is taking benadryl 25 mg twice daily   3. Hypothyroidism due to acquired atrophy of thyroid: stable will continue synthroid 25 mcg daily   4. Dyslipidemia: stable will continue lipitor 20 mg daily   PREVIOUS  5. GERD without esophagitis: stable will continue protonix 40 mg twice daily   6. Primary osteoarthritis involving multiple joints: is status post left hip replacement. Will continue therapy as directed and will follow up with orthopedics. Will continue naaporxen 500 mg  twice daily baclofen 10 mg nightly    7.  Chronic back pain: is stable will continue lyrica 75 mg twice daily   8. Anxiety: stable will continue effexor xr 150 mg daily   9. Current smoker: will continue chantix 1 mg twice daily    MD is aware of resident's narcotic use and is in agreement with current plan of care. We will attempt to wean resident as apropriate   Ok Edwards NP [facility]  Contact [phone] Monday through Friday 8am- 5pm  After hours call [phone]

## 2017-07-01 DIAGNOSIS — Z471 Aftercare following joint replacement surgery: Secondary | ICD-10-CM | POA: Diagnosis not present

## 2017-07-01 DIAGNOSIS — Z96641 Presence of right artificial hip joint: Secondary | ICD-10-CM | POA: Diagnosis not present

## 2017-07-01 DIAGNOSIS — Z96642 Presence of left artificial hip joint: Secondary | ICD-10-CM | POA: Diagnosis not present

## 2017-07-03 ENCOUNTER — Other Ambulatory Visit: Payer: Self-pay | Admitting: Acute Care

## 2017-07-07 ENCOUNTER — Encounter: Payer: Self-pay | Admitting: Adult Health

## 2017-07-07 ENCOUNTER — Non-Acute Institutional Stay (SKILLED_NURSING_FACILITY): Payer: Medicare HMO | Admitting: Adult Health

## 2017-07-07 ENCOUNTER — Other Ambulatory Visit: Payer: Self-pay

## 2017-07-07 DIAGNOSIS — J9611 Chronic respiratory failure with hypoxia: Secondary | ICD-10-CM | POA: Diagnosis not present

## 2017-07-07 DIAGNOSIS — M150 Primary generalized (osteo)arthritis: Secondary | ICD-10-CM

## 2017-07-07 DIAGNOSIS — M159 Polyosteoarthritis, unspecified: Secondary | ICD-10-CM

## 2017-07-07 DIAGNOSIS — J449 Chronic obstructive pulmonary disease, unspecified: Secondary | ICD-10-CM | POA: Diagnosis not present

## 2017-07-07 DIAGNOSIS — M8949 Other hypertrophic osteoarthropathy, multiple sites: Secondary | ICD-10-CM

## 2017-07-07 MED ORDER — PREGABALIN 75 MG PO CAPS: 75.0000 mg | ORAL_CAPSULE | Freq: Two times a day (BID) | ORAL | 0 refills | Status: DC

## 2017-07-07 NOTE — Progress Notes (Signed)
Location:   [facility] Room Number: 129 A Place of Service:  SNF (31)    CODE STATUS: Full Code  Allergies  Allergen Reactions  . Keflex [Cephalexin] Other (See Comments)    unspecified  . Prednisone Other (See Comments)    "counter reacts"  . Shellfish Allergy Other (See Comments)    Stomach cramps  . Tetracyclines & Related Other (See Comments)    unspecified  . Dilaudid [Hydromorphone Hcl] Other (See Comments)    "Lethargy"  . Incruse Ellipta [Umeclidinium Bromide] Nausea Only  . Tuberculin Tests Other (See Comments)    Chief Complaint  Patient presents with  . Discharge Note    Discharging on 07/07/17    HPI:  She had been hospitalized for a left hip replacement. She was admitted to this facility for short term rehab has done well and is now ready for discharge to home. She will need home health for pt/ot/rn/cna. She will need a standard wheelchair. Will need her prescriptions written and will need to follow up with her medical provider.    Past Medical History:  Diagnosis Date  . Alcoholism and drug addiction in family   . Anxiety   . Arthritis    "knees, hips, lower back" (09/25/2016)  . Asthma   . Bipolar disorder ([place])   . Chronic bronchitis ([place])    "all the time" (09/25/2016)  . Chronic leg pain    RLE  . Chronic lower back pain   . COPD (chronic obstructive pulmonary disease) ([place])   . Depression   . Emphysema of lung ([place])   . GERD (gastroesophageal reflux disease)   . Headache   . Hepatitis C    "treated back in 2001-2002"  . High cholesterol   . Hypertension   . Hypothyroidism   . Incarcerated ventral hernia 04/04/2017  . On home oxygen therapy    "2L; at nighttime" (04/04/2017)  . OSA on CPAP   . Pneumonia    "all the time" (09/25/2016)  . Positive PPD    with negative chest x ray  . Tachycardia    patient reports with exertion ; denies any other conditions   . Thyroid disease   . Type II diabetes mellitus ([place])     Past  Surgical History:  Procedure Laterality Date  . BACK SURGERY    . CARDIAC CATHETERIZATION    . COLONOSCOPY    . CYST EXCISION     removal of cyst in sinuses  . DILATION AND CURETTAGE OF UTERUS    . FOOT SURGERY Bilateral    bone removed from 4th and 5 th toes and put in pin then took pin out  . HERNIA REPAIR    . INCISION AND DRAINAGE  ~ 2014/2015   "removed mesh & infection"  . INCISIONAL HERNIA REPAIR N/A 04/04/2017   Procedure: LAPAROSCOPIC INCISIONAL HERNIA REPAIR WITH MESH;  Surgeon: Fanny Skates, MD;  Location: [place];  Service: General;  Laterality: N/A;  . LACERATION REPAIR Right    repair wrist artery from laceration from ice skate  . LAPAROSCOPIC INCISIONAL / UMBILICAL / VENTRAL HERNIA REPAIR  04/04/2017   LAPAROSCOPIC INCISIONAL HERNIA REPAIR WITH MESH  . LIPOMA EXCISION Right    fattty lipoma in neck  . NASAL SEPTUM SURGERY    . POSTERIOR LUMBAR FUSION  2015/2016 X 2   "added rods and screws"  . TOTAL HIP ARTHROPLASTY Right 02/05/2017   Procedure: RIGHT TOTAL HIP ARTHROPLASTY;  Surgeon: [name], MD;  [facility];  Service: Orthopedics;  Laterality: Right;  . TOTAL HIP ARTHROPLASTY Left 06/18/2017   Procedure: LEFT TOTAL HIP ARTHROPLASTY;  Surgeon: [name], MD;  Location: [facility];  Service: Orthopedics;  Laterality: Left;  . TUBAL LIGATION    . UMBILICAL HERNIA REPAIR  ~ 2013   w/mesh  . UPPER GI ENDOSCOPY      Social History   Socioeconomic History  . Marital status: Widowed    Spouse name: Not on file  . Number of children: Not on file  . Years of education: Not on file  . Highest education level: Not on file  Occupational History  . Not on file  Social Needs  . Financial resource strain: Not on file  . Food insecurity:    Worry: Not on file    Inability: Not on file  . Transportation needs:    Medical: Not on file    Non-medical: Not on file  Tobacco Use  . Smoking status: Former Smoker    Packs/day: 1.00    Years: 44.00    Pack  years: 44.00    Types: Cigarettes    Last attempt to quit: 11/22/2016    Years since quitting: 0.6  . Smokeless tobacco: Never Used  . Tobacco comment: pt quit smoking 11/22/16 and currently taking chantix  (04/04/2017)  Substance and Sexual Activity  . Alcohol use: Yes    Comment: 04/04/2017 "quit years ago"  . Drug use: Yes    Types: "Crack" cocaine    Comment: 04/04/2017 "nothing since 01/12/2012"  . Sexual activity: Never  Lifestyle  . Physical activity:    Days per week: Not on file    Minutes per session: Not on file  . Stress: Not on file  Relationships  . Social connections:    Talks on phone: Not on file    Gets together: Not on file    Attends religious service: Not on file    Active member of club or organization: Not on file    Attends meetings of clubs or organizations: Not on file    Relationship status: Not on file  . Intimate partner violence:    Fear of current or ex partner: Not on file    Emotionally abused: Not on file    Physically abused: Not on file    Forced sexual activity: Not on file  Other Topics Concern  . Not on file  Social History Narrative  . Not on file   Family History  Problem Relation Age of Onset  . Diabetes Mother   . Hypertension Mother   . Hypertension Father   . Cancer Father        lung    VITAL SIGNS BP 138/70   Pulse 74   Temp 98.1 F (36.7 C)   Resp 18   Ht 5\' 8"  (1.727 m)   Wt 219 lb (99.3 kg)   SpO2 94%   BMI 33.30 kg/m   Patient's Medications  New Prescriptions   No medications on file  Previous Medications   ACETAMINOPHEN (TYLENOL) 325 MG TABLET    Take 1-2 tablets (325-650 mg total) by mouth every 6 (six) hours as needed for mild pain (pain score 1-3 or temp > 100.5).   ALBUTEROL (PROVENTIL HFA;VENTOLIN HFA) 108 (90 BASE) MCG/ACT INHALER    Inhale 2 puffs into the lungs every 6 (six) hours as needed for wheezing or shortness of breath (before activity).   ALBUTEROL (PROVENTIL) (2.5 MG/3ML) 0.083% NEBULIZER  SOLUTION    Take 2.5 mg by nebulization 2 (two) times daily. And every 6 hours as needed   ASPIRIN 325 MG TABLET    Take 325 mg by mouth daily.   ATENOLOL (TENORMIN) 25 MG TABLET    Take 1 tablet (25 mg total) by mouth daily.   ATORVASTATIN (LIPITOR) 20 MG TABLET    Take 20 mg by mouth daily.   BACLOFEN (LIORESAL) 10 MG TABLET    Take 10 mg by mouth at bedtime.    DIPHENHYDRAMINE (BENADRYL) 25 MG CAPSULE    Take 25 mg by mouth 2 (two) times daily.    DOCUSATE SODIUM (COLACE) 100 MG CAPSULE    Take 1 capsule (100 mg total) by mouth 2 (two) times daily.   LEVOTHYROXINE (SYNTHROID, LEVOTHROID) 25 MCG TABLET    Take 1 tablet (25 mcg total) by mouth daily before breakfast.   LIDOCAINE 4 % PTCH    Apply to left knee topically one time a day for pain management   MELATONIN 5 MG CAPS    Take 1 capsule (5 mg total) by mouth at bedtime.   METHOCARBAMOL (ROBAXIN) 500 MG TABLET    Take 1 tablet (500 mg total) by mouth every 6 (six) hours as needed for muscle spasms.   MOMETASONE-FORMOTEROL (DULERA) 200-5 MCG/ACT AERO    Inhale 2 puffs into the lungs 2 (two) times daily.   MONTELUKAST (SINGULAIR) 10 MG TABLET    Take 1 tablet (10 mg total) by mouth at bedtime.   MULTIPLE VITAMIN (MULTIVITAMIN) TABLET    Take 1 tablet by mouth daily.   NAPROXEN (NAPROSYN) 500 MG TABLET    Take 500 mg by mouth 2 (two) times daily with a meal.   NUTRITIONAL SUPPLEMENTS (NUTRITIONAL SUPPLEMENT PO)    NAS (No Salt Added) diet - Regular texture, thin liquids consistency   NYSTATIN-TRIAMCINOLONE OINTMENT (MYCOLOG)    Apply 1 application topically 2 (two) times daily as needed (applied to skin folds/groins/under breast skin irritation rash.).   PANTOPRAZOLE (PROTONIX) 40 MG TABLET    Take 40 mg by mouth 2 (two) times daily.   POLYETHYLENE GLYCOL (MIRALAX / GLYCOLAX) PACKET    Take 17 g by mouth daily as needed for mild constipation.   PREGABALIN (LYRICA) 75 MG CAPSULE    Take 1 capsule (75 mg total) by mouth 2 (two) times daily.    ROFLUMILAST (DALIRESP) 500 MCG TABS TABLET    Take 1 tablet (500 mcg total) by mouth daily.   VALACYCLOVIR (VALTREX) 500 MG TABLET    Take 500 mg by mouth daily.    VARENICLINE (CHANTIX CONTINUING MONTH PAK) 1 MG TABLET    Take 1 tablet (1 mg total) by mouth 2 (two) times daily.   VENLAFAXINE XR (EFFEXOR-XR) 150 MG 24 HR CAPSULE    Take 1 capsule (150 mg total) by mouth daily with breakfast.  Modified Medications   No medications on file  Discontinued Medications   No medications on file     SIGNIFICANT DIAGNOSTIC EXAMS   PREVIOUS:   02-06-17: right hip x-ray:  No appreciable change from 1 day prior. Prosthetic components appear well seated on frontal view. No evident fracture or dislocation.  06-18-17: left hip x-ray: No immediate postprocedure complication following left total hip joint prosthesis placement.   NO NEW EXAMS     LABS REVIEWED PREVIOUS:   09-27-16: free T4: 0.72 02-03-17: hgb a1c 6.0 02-07-17: wbc 16.3; hgb 11.6; hct 34.9; mcv 95.6; plt 223; glucose 171; bun  8; creat 0.75; k+ 4.0; na++ 134; ca 8.5 06-20-17: wbc 14.1; hgb 9.9; hct 30.9; mcv 94.8; plt 240; glucose 129; bun 15; creat 0.92; k+ 3.7; na++ 139; ca 8.5  06-21-17: wbc 12.9; hgb 9.3; hct 29.0; mcv 94.5; plt 239   NO NEW LABS.    Review of Systems  Constitutional: Negative for malaise/fatigue.  Respiratory: Negative for cough and shortness of breath.   Cardiovascular: Negative for chest pain, palpitations and leg swelling.  Gastrointestinal: Negative for abdominal pain, constipation and heartburn.  Musculoskeletal: Negative for back pain, joint pain and myalgias.  Skin: Negative.   Neurological: Negative for dizziness.  Psychiatric/Behavioral: The patient is not nervous/anxious.      Physical Exam  Constitutional: She is oriented to person, place, and time. She appears well-developed and well-nourished. No distress.  Obese   Neck: No thyromegaly present.  Cardiovascular: Normal rate, regular  rhythm, normal heart sounds and intact distal pulses.  Pulmonary/Chest: Effort normal and breath sounds normal. No respiratory distress.  Abdominal: Soft. Bowel sounds are normal. She exhibits no distension. There is no tenderness.  Musculoskeletal: She exhibits no edema.  Is able to move all extremities Is status post left hip replacement   Lymphadenopathy:    She has no cervical adenopathy.  Neurological: She is alert and oriented to person, place, and time.  Skin: Skin is warm and dry. She is not diaphoretic.  Psychiatric: She has a normal mood and affect.      ASSESSMENT/ PLAN:  Patient is being discharged with the following home health services:  Pt/ot/rn/cna: to evaluate and treat as indicated for gait strength adl training medication management and adl care.   Patient is being discharged with the following durable medical equipment:  Standard wheelchair with elevated leg rests; cushion; anti-tippers; brake extensions to allow her to maintain his current level of independence with her adls which cannot be achieved with a walker; can self propel.   Patient has been advised to f/u with their PCP in 1-2 weeks to bring them up to date on their rehab stay.  Social services at facility was responsible for arranging this appointment.  Pt was provided with a 30 day supply of prescriptions for medications and refills must be obtained from their PCP.  For controlled substances, a more limited supply may be provided adequate until PCP appointment only.  A 30 day supply of her prescription medications as per the list as above # 60 lyrica 75 mg tabs  Time spent with patient 40 minutes: discussed medications; home health needs and expectations; and dme. Verbalized understanding.    Ok Edwards NP [facility]  Contact [phone] Monday through Friday 8am- 5pm  After hours call [phone]

## 2017-07-07 NOTE — Telephone Encounter (Signed)
Discharged with patient 

## 2017-07-08 DIAGNOSIS — J449 Chronic obstructive pulmonary disease, unspecified: Secondary | ICD-10-CM | POA: Diagnosis not present

## 2017-07-08 DIAGNOSIS — J441 Chronic obstructive pulmonary disease with (acute) exacerbation: Secondary | ICD-10-CM | POA: Diagnosis not present

## 2017-07-08 DIAGNOSIS — J9611 Chronic respiratory failure with hypoxia: Secondary | ICD-10-CM | POA: Diagnosis not present

## 2017-07-09 DIAGNOSIS — K219 Gastro-esophageal reflux disease without esophagitis: Secondary | ICD-10-CM | POA: Diagnosis not present

## 2017-07-09 DIAGNOSIS — J439 Emphysema, unspecified: Secondary | ICD-10-CM | POA: Diagnosis not present

## 2017-07-09 DIAGNOSIS — M170 Bilateral primary osteoarthritis of knee: Secondary | ICD-10-CM | POA: Diagnosis not present

## 2017-07-09 DIAGNOSIS — R69 Illness, unspecified: Secondary | ICD-10-CM | POA: Diagnosis not present

## 2017-07-09 DIAGNOSIS — I10 Essential (primary) hypertension: Secondary | ICD-10-CM | POA: Diagnosis not present

## 2017-07-09 DIAGNOSIS — E119 Type 2 diabetes mellitus without complications: Secondary | ICD-10-CM | POA: Diagnosis not present

## 2017-07-09 DIAGNOSIS — G4733 Obstructive sleep apnea (adult) (pediatric): Secondary | ICD-10-CM | POA: Diagnosis not present

## 2017-07-09 DIAGNOSIS — E039 Hypothyroidism, unspecified: Secondary | ICD-10-CM | POA: Diagnosis not present

## 2017-07-09 DIAGNOSIS — F330 Major depressive disorder, recurrent, mild: Secondary | ICD-10-CM | POA: Diagnosis not present

## 2017-07-09 DIAGNOSIS — Z96642 Presence of left artificial hip joint: Secondary | ICD-10-CM | POA: Diagnosis not present

## 2017-07-09 DIAGNOSIS — Z471 Aftercare following joint replacement surgery: Secondary | ICD-10-CM | POA: Diagnosis not present

## 2017-07-09 DIAGNOSIS — M1611 Unilateral primary osteoarthritis, right hip: Secondary | ICD-10-CM | POA: Diagnosis not present

## 2017-07-11 DIAGNOSIS — R269 Unspecified abnormalities of gait and mobility: Secondary | ICD-10-CM | POA: Diagnosis not present

## 2017-07-11 DIAGNOSIS — M6281 Muscle weakness (generalized): Secondary | ICD-10-CM | POA: Diagnosis not present

## 2017-07-14 DIAGNOSIS — Z96642 Presence of left artificial hip joint: Secondary | ICD-10-CM | POA: Diagnosis not present

## 2017-07-14 DIAGNOSIS — J9611 Chronic respiratory failure with hypoxia: Secondary | ICD-10-CM | POA: Diagnosis not present

## 2017-07-14 DIAGNOSIS — J449 Chronic obstructive pulmonary disease, unspecified: Secondary | ICD-10-CM | POA: Diagnosis not present

## 2017-07-14 DIAGNOSIS — J441 Chronic obstructive pulmonary disease with (acute) exacerbation: Secondary | ICD-10-CM | POA: Diagnosis not present

## 2017-07-14 DIAGNOSIS — G4733 Obstructive sleep apnea (adult) (pediatric): Secondary | ICD-10-CM | POA: Diagnosis not present

## 2017-07-14 DIAGNOSIS — Z471 Aftercare following joint replacement surgery: Secondary | ICD-10-CM | POA: Diagnosis not present

## 2017-07-18 ENCOUNTER — Ambulatory Visit: Payer: Self-pay | Admitting: *Deleted

## 2017-07-18 ENCOUNTER — Encounter: Payer: Self-pay | Admitting: Internal Medicine

## 2017-07-18 ENCOUNTER — Ambulatory Visit (INDEPENDENT_AMBULATORY_CARE_PROVIDER_SITE_OTHER): Payer: Medicare HMO | Admitting: Internal Medicine

## 2017-07-18 VITALS — BP 132/92 | HR 92 | Temp 97.9°F | Ht 68.0 in | Wt 215.0 lb

## 2017-07-18 DIAGNOSIS — R0789 Other chest pain: Secondary | ICD-10-CM | POA: Insufficient documentation

## 2017-07-18 DIAGNOSIS — Z8249 Family history of ischemic heart disease and other diseases of the circulatory system: Secondary | ICD-10-CM | POA: Diagnosis not present

## 2017-07-18 NOTE — Telephone Encounter (Signed)
Noted  

## 2017-07-18 NOTE — Patient Instructions (Signed)
We will get you in with the cardiologist to check the heart.

## 2017-07-18 NOTE — Assessment & Plan Note (Signed)
Referral to cardiology for testing and counseling as appropriate. She is on BP meds and no diabetes. Is a mostly non-smoker but significant smoking history. Taking aspirin daily.

## 2017-07-18 NOTE — Progress Notes (Signed)
   Subjective:    Patient ID: Joan [name], female    DOB: [birth year], 57 y.o.   MRN: [id number]  HPI The patient is a 57 YO female coming in for fast heart rate with activities. She has had several recent surgeries including most recent left hip surgery. There is mild pressure in her chest with the activity. She has had extensive workup on her heart in the past before she moved here including carotid arteries. She states that there was a 50% blockage in the right carotid which has not been checked in some time. She denies SOB or chest pain with breathing. She does have some increase in heart rate with exercise with PT from about 85 to 125 after activity. She is gradually building up her strength and is walking with cane, sometimes walker at home, wheelchair for long distances. Wants to see cardiology to make sure nothing is wrong with her heart. She has several sisters that all have heart problems and at least 1 with what sounds like bypass surgery.   Review of Systems  Constitutional: Positive for activity change.  HENT: Negative.   Eyes: Negative.   Respiratory: Negative for cough, chest tightness and shortness of breath.   Cardiovascular: Positive for chest pain. Negative for palpitations and leg swelling.       Pressure sensation, not when walking  Gastrointestinal: Positive for nausea. Negative for abdominal distention, abdominal pain, constipation, diarrhea and vomiting.  Musculoskeletal: Positive for gait problem.  Skin: Negative.   Psychiatric/Behavioral: Negative.       Objective:   Physical Exam  Constitutional: She is oriented to person, place, and time. She appears well-developed and well-nourished.  HENT:  Head: Normocephalic and atraumatic.  Eyes: EOM are normal.  Neck: Normal range of motion.  Cardiovascular: Normal rate and regular rhythm.  Pulmonary/Chest: Effort normal and breath sounds normal. No respiratory distress. She has no wheezes. She has no rales.  Abdominal:  Soft. Bowel sounds are normal. She exhibits no distension. There is no tenderness. There is no rebound.  Musculoskeletal: She exhibits no edema.  Neurological: She is alert and oriented to person, place, and time. Coordination normal.  Skin: Skin is warm and dry.   Vitals:   07/18/17 1103  BP: (!) 132/92  Pulse: 92  Temp: 97.9 F (36.6 C)  TempSrc: Oral  SpO2: 97%  Weight: 215 lb (97.5 kg)  Height: 5\' 8"  (1.727 m)      Assessment & Plan:

## 2017-07-18 NOTE — Telephone Encounter (Signed)
Patient is calling with concerns of increased heart rate with activity. She states she has normal reads at rest- but she has increases that are not normal when she gets up to move around the house. Patient has COPD and she does have increased trouble breathing with the increased heart rate. Appointment made to evaluate the heart rate.PT came today and canceled due to patient complaints- appointment made. Reason for Disposition . [1] Heart beating very rapidly (e.g., > 140 / minute) AND [2] not present now  (Exception: during exercise)  Answer Assessment - Initial Assessment Questions 1. DESCRIPTION: "Please describe your heart rate or heart beat that you are having" (e.g., fast/slow, regular/irregular, skipped or extra beats, "palpitations")     188/106 P 83-  Less than 20 feet- heart rate increases to 133 2. ONSET: "When did it start?" (Minutes, hours or days)      1 week 3. DURATION: "How long does it last" (e.g., seconds, minutes, hours)     Few minutes- 5 minutes 4. PATTERN "Does it come and go, or has it been constant since it started?"  "Does it get worse with exertion?"   "Are you feeling it now?"     Constant- with exertion- patient has trouble breathing due to COPD- breathing is worse 5. TAP: "Using your hand, can you tap out what you are feeling on a chair or table in front of you, so that I can hear?" (Note: not all patients can do this)       unknown 6. HEART RATE: "Can you tell me your heart rate?" "How many beats in 15 seconds?"  (Note: not all patients can do this)       Sitting- 79- regular sitting 7. RECURRENT SYMPTOM: "Have you ever had this before?" If so, ask: "When was the last time?" and "What happened that time?"      Yes- but not this bad- or regular 8. CAUSE: "What do you think is causing the palpitations?"     Patient thinks her blockage may be worse 9. CARDIAC HISTORY: "Do you have any history of heart disease?" (e.g., heart attack, angina, bypass surgery, angioplasty,  arrhythmia)      Blockage in R Caroid artery 10. OTHER SYMPTOMS: "Do you have any other symptoms?" (e.g., dizziness, chest pain, sweating, difficulty breathing)       COPD, increased sweating, some light headedness, nausea 11. PREGNANCY: "Is there any chance you are pregnant?" "When was your last menstrual period?"       n/a  Protocols used: HEART RATE AND HEARTBEAT QUESTIONS-A-AH

## 2017-07-18 NOTE — Assessment & Plan Note (Signed)
Referral to cardiology for possible stress testing. She is a mostly non-smoker but long smoking history and is high risk for CAD with many family members with severe CAD.

## 2017-07-21 DIAGNOSIS — G4733 Obstructive sleep apnea (adult) (pediatric): Secondary | ICD-10-CM | POA: Diagnosis not present

## 2017-07-21 DIAGNOSIS — J449 Chronic obstructive pulmonary disease, unspecified: Secondary | ICD-10-CM | POA: Diagnosis not present

## 2017-07-21 DIAGNOSIS — J9611 Chronic respiratory failure with hypoxia: Secondary | ICD-10-CM | POA: Diagnosis not present

## 2017-07-21 DIAGNOSIS — J441 Chronic obstructive pulmonary disease with (acute) exacerbation: Secondary | ICD-10-CM | POA: Diagnosis not present

## 2017-07-22 ENCOUNTER — Encounter: Payer: Self-pay | Admitting: Pulmonary Disease

## 2017-07-22 DIAGNOSIS — J449 Chronic obstructive pulmonary disease, unspecified: Secondary | ICD-10-CM | POA: Diagnosis not present

## 2017-07-22 DIAGNOSIS — G4733 Obstructive sleep apnea (adult) (pediatric): Secondary | ICD-10-CM | POA: Diagnosis not present

## 2017-07-22 DIAGNOSIS — J9611 Chronic respiratory failure with hypoxia: Secondary | ICD-10-CM | POA: Diagnosis not present

## 2017-07-22 DIAGNOSIS — J441 Chronic obstructive pulmonary disease with (acute) exacerbation: Secondary | ICD-10-CM | POA: Diagnosis not present

## 2017-08-05 ENCOUNTER — Encounter: Payer: Self-pay | Admitting: Family

## 2017-08-05 ENCOUNTER — Ambulatory Visit: Payer: Self-pay | Admitting: Internal Medicine

## 2017-08-05 ENCOUNTER — Ambulatory Visit (INDEPENDENT_AMBULATORY_CARE_PROVIDER_SITE_OTHER)
Admission: RE | Admit: 2017-08-05 | Discharge: 2017-08-05 | Disposition: A | Discharge status: Home / Self Care | Payer: Medicare HMO | Source: Ambulatory Visit | Attending: Family | Admitting: Family

## 2017-08-05 ENCOUNTER — Ambulatory Visit (INDEPENDENT_AMBULATORY_CARE_PROVIDER_SITE_OTHER): Payer: Medicare HMO | Admitting: Family

## 2017-08-05 ENCOUNTER — Other Ambulatory Visit (INDEPENDENT_AMBULATORY_CARE_PROVIDER_SITE_OTHER): Payer: Medicare HMO

## 2017-08-05 VITALS — BP 140/80 | HR 80 | Temp 99.3°F | Ht 68.0 in | Wt 219.0 lb

## 2017-08-05 DIAGNOSIS — R110 Nausea: Secondary | ICD-10-CM | POA: Diagnosis not present

## 2017-08-05 DIAGNOSIS — I10 Essential (primary) hypertension: Secondary | ICD-10-CM | POA: Diagnosis not present

## 2017-08-05 DIAGNOSIS — R05 Cough: Secondary | ICD-10-CM | POA: Diagnosis not present

## 2017-08-05 DIAGNOSIS — R059 Cough, unspecified: Secondary | ICD-10-CM

## 2017-08-05 LAB — URINALYSIS
Bilirubin Urine: NEGATIVE
Ketones, ur: NEGATIVE
Leukocytes, UA: NEGATIVE
Nitrite: NEGATIVE
Specific Gravity, Urine: 1.025 (ref 1.000–1.030)
Total Protein, Urine: NEGATIVE
Urine Glucose: NEGATIVE
Urobilinogen, UA: 0.2 (ref 0.0–1.0)
pH: 5 (ref 5.0–8.0)

## 2017-08-05 LAB — COMPREHENSIVE METABOLIC PANEL
ALT: 19 U/L (ref 0–35)
AST: 13 U/L (ref 0–37)
Albumin: 4.2 g/dL (ref 3.5–5.2)
Alkaline Phosphatase: 149 U/L — ABNORMAL HIGH (ref 39–117)
BUN: 17 mg/dL (ref 6–23)
CO2: 32 mEq/L (ref 19–32)
Calcium: 9.4 mg/dL (ref 8.4–10.5)
Chloride: 105 mEq/L (ref 96–112)
Creatinine, Ser: 1.1 mg/dL (ref 0.40–1.20)
GFR: 54.66 mL/min — ABNORMAL LOW (ref >60.00)
Glucose, Bld: 97 mg/dL (ref 70–99)
Potassium: 4.3 mEq/L (ref 3.5–5.1)
Sodium: 143 mEq/L (ref 135–145)
Total Bilirubin: 0.3 mg/dL (ref 0.2–1.2)
Total Protein: 7 g/dL (ref 6.0–8.3)

## 2017-08-05 LAB — CBC WITH DIFFERENTIAL/PLATELET
Basophils Absolute: 0.4 10*3/uL — ABNORMAL HIGH (ref 0.0–0.1)
Basophils Relative: 3.4 % — ABNORMAL HIGH (ref 0.0–3.0)
Eosinophils Absolute: 0.6 10*3/uL (ref 0.0–0.7)
Eosinophils Relative: 5.5 % — ABNORMAL HIGH (ref 0.0–5.0)
HCT: 37.7 % (ref 36.0–46.0)
Hemoglobin: 12.3 g/dL (ref 12.0–15.0)
Lymphocytes Relative: 26.2 % (ref 12.0–46.0)
Lymphs Abs: 3 10*3/uL (ref 0.7–4.0)
MCHC: 32.6 g/dL (ref 30.0–36.0)
MCV: 90.7 fl (ref 78.0–100.0)
Monocytes Absolute: 0.9 10*3/uL (ref 0.1–1.0)
Monocytes Relative: 7.7 % (ref 3.0–12.0)
Neutro Abs: 6.5 10*3/uL (ref 1.4–7.7)
Neutrophils Relative %: 57.2 % (ref 43.0–77.0)
Platelets: 341 10*3/uL (ref 150.0–400.0)
RBC: 4.16 Mil/uL (ref 3.87–5.11)
RDW: 14.8 % (ref 11.5–15.5)
WBC: 11.3 10*3/uL — ABNORMAL HIGH (ref 4.0–10.5)

## 2017-08-05 NOTE — Telephone Encounter (Signed)
Physical therapist is  At the patients home and patients  blood pressure is elevated   she has a slight headache and hot flashes . She is awake and alert she wants to be seen in the office today. Dr Sharlet Salina not available today appointment made with Jodi Mourning - spoke with Gareth Eagle at the office    Reason for Disposition . Systolic BP  >= 885 OR Diastolic >= 027  Answer Assessment - Initial Assessment Questions 1. BLOOD PRESSURE: "What is the blood pressure?" "Did you take at least two measurements 5 minutes apart?"     198/110   180/96   puls rate is 70  2. ONSET: "When did you take your blood pressure?"     1240   3. HOW: "How did you obtain the blood pressure?" (e.g., visiting nurse, automatic home BP monitor)    Manuel  4. HISTORY: "Do you have a history of high blood pressure?"     Yes   5. MEDICATIONS: "Are you taking any medications for blood pressure?" "Have you missed any doses recently?"       Yes   Has  Not missed any doses   6. OTHER SYMPTOMS: "Do you have any symptoms?" (e.g., headache, chest pain, blurred vision, difficulty breathing, weakness)    Headache  Nausea  Hot flashes  Blurred vision  Shortness palpations   7. PREGNANCY: "Is there any chance you are pregnant?" "When was your last menstrual period?"     n/a  Protocols used: HIGH BLOOD PRESSURE-A-AH

## 2017-08-05 NOTE — Progress Notes (Signed)
Joan [name] is a 57 y.o. female with the following history as recorded in [employer]:  Patient Active Problem List   Diagnosis Date Noted  . Chest pressure 07/18/2017  . Family history of heart disease 07/18/2017  . GERD without esophagitis 06/23/2017  . H/O total hip arthroplasty, left 06/18/2017  . Prediabetes 04/05/2017  . Hypothyroidism due to acquired atrophy of thyroid 04/05/2017  . Primary osteoarthritis involving multiple joints 04/05/2017  . Incarcerated ventral hernia 04/04/2017  . Chronic constipation 02/10/2017  . Iron deficiency anemia due to dietary causes 02/10/2017  . Osteoarthritis 02/10/2017  . Dyslipidemia 02/10/2017  . H/O total hip arthroplasty, right 02/05/2017  . OSA treated with BiPAP 01/14/2017  . Pre-operative clearance 12/16/2016  . Chronic respiratory failure with hypoxia ([place]) 12/16/2016  . Essential hypertension 11/29/2016  . Back pain 11/29/2016  . Vocal cord dysfunction   . COPD, severe ([place]) 09/24/2016  . Anxiety 09/24/2016  . Steroid-induced diabetes ([place]) 09/24/2016    Current Outpatient Medications  Medication Sig Dispense Refill  . acetaminophen (TYLENOL) 325 MG tablet Take 1-2 tablets (325-650 mg total) by mouth every 6 (six) hours as needed for mild pain (pain score 1-3 or temp > 100.5). 30 tablet 0  . albuterol (PROVENTIL HFA;VENTOLIN HFA) 108 (90 Base) MCG/ACT inhaler Inhale 2 puffs into the lungs every 6 (six) hours as needed for wheezing or shortness of breath (before activity). 3 Inhaler 1  . albuterol (PROVENTIL) (2.5 MG/3ML) 0.083% nebulizer solution Take 2.5 mg by nebulization 2 (two) times daily. And every 6 hours as needed    . aspirin 325 MG tablet Take 325 mg by mouth daily.    Marland Kitchen atenolol (TENORMIN) 25 MG tablet Take 1 tablet (25 mg total) by mouth daily. 90 tablet 2  . atorvastatin (LIPITOR) 20 MG tablet Take 20 mg by mouth daily.    . baclofen (LIORESAL) 10 MG tablet Take 10 mg by mouth at bedtime.     . diphenhydrAMINE  (BENADRYL) 25 mg capsule Take 25 mg by mouth 2 (two) times daily.     Marland Kitchen docusate sodium (COLACE) 100 MG capsule Take 1 capsule (100 mg total) by mouth 2 (two) times daily. 10 capsule 0  . hydrochlorothiazide (HYDRODIURIL) 25 MG tablet     . levothyroxine (SYNTHROID, LEVOTHROID) 25 MCG tablet Take 1 tablet (25 mcg total) by mouth daily before breakfast. 90 tablet 2  . Lidocaine 4 % PTCH Apply to left knee topically one time a day for pain management    . Melatonin 5 MG CAPS Take 1 capsule (5 mg total) by mouth at bedtime. 30 capsule 0  . methocarbamol (ROBAXIN) 500 MG tablet Take 1 tablet (500 mg total) by mouth every 6 (six) hours as needed for muscle spasms. 40 tablet 0  . montelukast (SINGULAIR) 10 MG tablet Take 1 tablet (10 mg total) by mouth at bedtime. 90 tablet 3  . Multiple Vitamin (MULTIVITAMIN) tablet Take 1 tablet by mouth daily.    . naproxen (NAPROSYN) 500 MG tablet Take 500 mg by mouth 2 (two) times daily with a meal.    . Nutritional Supplements (NUTRITIONAL SUPPLEMENT PO) NAS (No Salt Added) diet - Regular texture, thin liquids consistency    . nystatin-triamcinolone (MYCOLOG II) cream nystatin-triamcinolone 100,000 unit/g-0.1 % topical cream    . nystatin-triamcinolone ointment (MYCOLOG) Apply 1 application topically 2 (two) times daily as needed (applied to skin folds/groins/under breast skin irritation rash.).    Marland Kitchen pantoprazole (PROTONIX) 40 MG tablet Take 40 mg  by mouth 2 (two) times daily.    . polyethylene glycol (MIRALAX / GLYCOLAX) packet Take 17 g by mouth daily as needed for mild constipation. 14 each 0  . pregabalin (LYRICA) 75 MG capsule Take 1 capsule (75 mg total) by mouth 2 (two) times daily. 60 capsule 0  . roflumilast (DALIRESP) 500 MCG TABS tablet Take 1 tablet (500 mcg total) by mouth daily. 90 tablet 1  . SYMBICORT 160-4.5 MCG/ACT inhaler     . valACYclovir (VALTREX) 500 MG tablet Take 500 mg by mouth daily.     . varenicline (CHANTIX CONTINUING MONTH PAK) 1 MG  tablet Take 1 tablet (1 mg total) by mouth 2 (two) times daily. 180 tablet 1  . venlafaxine XR (EFFEXOR-XR) 150 MG 24 hr capsule Take 1 capsule (150 mg total) by mouth daily with breakfast. 90 capsule 2   No current facility-administered medications for this visit.     Allergies: Keflex [cephalexin]; Prednisone; Shellfish allergy; Tetracyclines & related; Dilaudid [hydromorphone hcl]; Incruse ellipta [umeclidinium bromide]; and Tuberculin tests  Past Medical History:  Diagnosis Date  . Alcoholism and drug addiction in family   . Anxiety   . Arthritis    "knees, hips, lower back" (09/25/2016)  . Asthma   . Bipolar disorder ([place])   . Chronic bronchitis ([place])    "all the time" (09/25/2016)  . Chronic leg pain    RLE  . Chronic lower back pain   . COPD (chronic obstructive pulmonary disease) ([place])   . Depression   . Emphysema of lung ([place])   . GERD (gastroesophageal reflux disease)   . Headache   . Hepatitis C    "treated back in 2001-2002"  . High cholesterol   . Hypertension   . Hypothyroidism   . Incarcerated ventral hernia 04/04/2017  . On home oxygen therapy    "2L; at nighttime" (04/04/2017)  . OSA on CPAP   . Pneumonia    "all the time" (09/25/2016)  . Positive PPD    with negative chest x ray  . Tachycardia    patient reports with exertion ; denies any other conditions   . Thyroid disease   . Type II diabetes mellitus ([place])     Past Surgical History:  Procedure Laterality Date  . BACK SURGERY    . CARDIAC CATHETERIZATION    . COLONOSCOPY    . CYST EXCISION     removal of cyst in sinuses  . DILATION AND CURETTAGE OF UTERUS    . FOOT SURGERY Bilateral    bone removed from 4th and 5 th toes and put in pin then took pin out  . HERNIA REPAIR    . INCISION AND DRAINAGE  ~ 2014/2015   "removed mesh & infection"  . INCISIONAL HERNIA REPAIR N/A 04/04/2017   Procedure: LAPAROSCOPIC INCISIONAL HERNIA REPAIR WITH MESH;  Surgeon: Fanny Skates, MD;  Location: [facility];  Service:  General;  Laterality: N/A;  . LACERATION REPAIR Right    repair wrist artery from laceration from ice skate  . LAPAROSCOPIC INCISIONAL / UMBILICAL / VENTRAL HERNIA REPAIR  04/04/2017   LAPAROSCOPIC INCISIONAL HERNIA REPAIR WITH MESH  . LIPOMA EXCISION Right    fattty lipoma in neck  . NASAL SEPTUM SURGERY    . POSTERIOR LUMBAR FUSION  2015/2016 X 2   "added rods and screws"  . TOTAL HIP ARTHROPLASTY Right 02/05/2017   Procedure: RIGHT TOTAL HIP ARTHROPLASTY;  Surgeon: [name], MD;  Location: [facility];  Service: Orthopedics;  Laterality: Right;  . TOTAL HIP ARTHROPLASTY Left 06/18/2017   Procedure: LEFT TOTAL HIP ARTHROPLASTY;  Surgeon: [name], MD;  Location: [facility];  Service: Orthopedics;  Laterality: Left;  . TUBAL LIGATION    . UMBILICAL HERNIA REPAIR  ~ 2013   w/mesh  . UPPER GI ENDOSCOPY      Family History  Problem Relation Age of Onset  . Diabetes Mother   . Hypertension Mother   . Hypertension Father   . Cancer Father        lung    Social History   Tobacco Use  . Smoking status: Former Smoker    Packs/day: 1.00    Years: 44.00    Pack years: 44.00    Types: Cigarettes    Last attempt to quit: 11/22/2016    Years since quitting: 0.7  . Smokeless tobacco: Never Used  . Tobacco comment: pt quit smoking 11/22/16 and currently taking chantix  (04/04/2017)  Substance Use Topics  . Alcohol use: Yes    Comment: 04/04/2017 "quit years ago"    Subjective:  Patient had home health visit today and blood pressure was noted to be quite elevated; notes that for the past few weeks, she has been having increased problems with her blood pressure fluctuating; has prescription for Atenolol 25 mg that she takes daily; has also been taking her Lipitor 20 mg daily; there is a prescription for HCTZ on the patient's medication list but she states she is not taking this medication because her PCP did not prescribe the medication. Patient is a difficult historian- these are not new  issues and she spoke to her PCP about these concerns 2 weeks ago/ scheduled to see cardiology next week;  She does also mention that for the past few days she has not been feeling more tired than normal, increased nausea; denies any known fever; + chronic cough; she mentions that her cough is more productive recently and that she has a "bad taste" in the back of her throat.   Objective:  Vitals:   08/05/17 1601  BP: 140/80  Pulse: 80  Temp: 99.3 F (37.4 C)  TempSrc: Oral  SpO2: 93%  Weight: 219 lb 0.6 oz (99.4 kg)  Height: _0  (1.727 m)    General: Well developed, well nourished, in no acute distress  Skin : Warm and dry.  Head: Normocephalic and atraumatic  Eyes: Sclera and conjunctiva clear; pupils round and reactive to light; extraocular movements intact  Ears: External normal; canals clear; tympanic membranes normal  Oropharynx: Pink, supple. No suspicious lesions  Neck: Supple without thyromegaly, adenopathy  Lungs: Respirations unlabored; clear to auscultation bilaterally without wheeze, rales, rhonchi  CVS exam: normal rate and regular rhythm.  Extremities: No edema, cyanosis, clubbing  Vessels: Symmetric bilaterally  Neurologic: Alert and oriented; speech intact; face symmetrical; uses cane for stability; CNII-XII intact without focal deficit  Assessment:  1. Cough   2. Nausea   3. Essential hypertension     Plan:  1. Update CXR today to rule out pneumonia; will most likely start patient on antibiotic; 2. Check CBC, CMP, U/A today; 3. Controlled in the office but has been fluctuating recently; she is not taking the HCTZ on her medication list and prefers to talk to cardiology next week; EKG done in office-no acute changes seen.   No follow-ups on file.  Orders Placed This Encounter  Procedures  . DG Chest 2 View    Standing Status:   Future  Number of Occurrences:   1    Standing Expiration Date:   10/06/2018    Order Specific Question:   Reason for Exam  (SYMPTOM  OR DIAGNOSIS REQUIRED)    Answer:   cough    Order Specific Question:   Is patient pregnant?    Answer:   No    Order Specific Question:   Preferred imaging location?    Answer:   [facility]    Order Specific Question:   Radiology Contrast Protocol - do NOT remove file path    Answer:   [email or web address]  . CBC w/Diff    Standing Status:   Future    Number of Occurrences:   1    Standing Expiration Date:   08/05/2018  . Comp Met (CMET)    Standing Status:   Future    Number of Occurrences:   1    Standing Expiration Date:   08/05/2018  . Urinalysis    Standing Status:   Future    Number of Occurrences:   1    Standing Expiration Date:   08/05/2018  . EKG 12-Lead    Requested Prescriptions    No prescriptions requested or ordered in this encounter

## 2017-08-06 ENCOUNTER — Other Ambulatory Visit: Payer: Self-pay | Admitting: Family

## 2017-08-06 DIAGNOSIS — R05 Cough: Secondary | ICD-10-CM | POA: Diagnosis not present

## 2017-08-06 MED ORDER — AZITHROMYCIN 250 MG PO TABS: ORAL_TABLET | ORAL | 0 refills | Status: DC

## 2017-08-07 ENCOUNTER — Ambulatory Visit (INDEPENDENT_AMBULATORY_CARE_PROVIDER_SITE_OTHER): Payer: Medicare HMO | Admitting: Acute Care

## 2017-08-07 ENCOUNTER — Encounter: Payer: Self-pay | Admitting: Acute Care

## 2017-08-07 DIAGNOSIS — G4733 Obstructive sleep apnea (adult) (pediatric): Secondary | ICD-10-CM | POA: Insufficient documentation

## 2017-08-07 DIAGNOSIS — J449 Chronic obstructive pulmonary disease, unspecified: Secondary | ICD-10-CM | POA: Diagnosis not present

## 2017-08-07 DIAGNOSIS — Z9989 Dependence on other enabling machines and devices: Secondary | ICD-10-CM | POA: Diagnosis not present

## 2017-08-07 MED ORDER — ALBUTEROL SULFATE (2.5 MG/3ML) 0.083% IN NEBU: 2.5000 mg | INHALATION_SOLUTION | Freq: Two times a day (BID) | RESPIRATORY_TRACT | 3 refills | Status: DC

## 2017-08-07 MED ORDER — ALBUTEROL SULFATE HFA 108 (90 BASE) MCG/ACT IN AERS: 1.0000 | INHALATION_SPRAY | Freq: Four times a day (QID) | RESPIRATORY_TRACT | 3 refills | Status: DC | PRN

## 2017-08-07 MED ORDER — SYMBICORT 160-4.5 MCG/ACT IN AERO: 2.0000 | INHALATION_SPRAY | Freq: Two times a day (BID) | RESPIRATORY_TRACT | 3 refills | Status: DC

## 2017-08-07 MED ORDER — MONTELUKAST SODIUM 10 MG PO TABS: 10.0000 mg | ORAL_TABLET | Freq: Every day | ORAL | 3 refills | Status: DC

## 2017-08-07 MED ORDER — ROFLUMILAST 500 MCG PO TABS: 500.0000 ug | ORAL_TABLET | Freq: Every day | ORAL | 1 refills | Status: DC

## 2017-08-07 NOTE — Assessment & Plan Note (Signed)
Mild flare Plan Use the Z Pack as prescribed by your PCP 2 days ago. Mucinex 1200 mg once daily with a full glass of water. Continue Symbicort and Daliresp as you have been doing. Continue albuterol nebs 3 times daily while you are flaring. Continue Singulair daily Continue Pro Air as needed for rescue. Please renew prescription ( 90 day supply with refills) Follow up with Dr. Gwenlyn Found as scheduled June 18th. Wear your oxygen at 2 L [place] with exertion  Saturation goals are 88-92% We will refer to pulmonary rehab once cleared from a hip perspective. Note your daily symptoms > remember "red flags" for COPD:  Increase in cough, increase in sputum production, increase in shortness of breath or activity tolerance. If you notice these symptoms, please call to be seen.   Note your daily symptoms > remember "red flags" for COPD:  Increase in cough, increase in sputum production, increase in shortness of breath or activity tolerance. If you notice these symptoms, please call to be seen. Follow up with Judson Roch NP or Dr.Alva in 4-6 weeks to make sure you are better. Please contact office for sooner follow up if symptoms do not improve or worsen or seek emergency care

## 2017-08-07 NOTE — Progress Notes (Signed)
History of Present Illness Joan [name] is a 57 y.o. female former smoker ( Quit 2018)  with Stage 3 severe COPD, and asthma.She has been a patient of Dr. Chase Caller since 07/2016.She is followed by Dr. Elsworth Soho for OSA andBiPAP therapy.  08/07/2017 Follow up after hip  surgery Pt. Presents for follow up. She had hip surgery 06/18/2017. She is doing well.She continues to have her exertional dyspnea. She is compliant with her oxygen " most " of the time, however she did not have it on today in the office.. She is using a cane in the office today.She uses a walker at times at home with her hip gets tired. She was seen at her PCP for cough 2 days ago and she was prescribes a z pack . CBC done 6/11 showed elevated WBC  11.3.  She has not picked this up or started taking the medication.  She states her secretions are green to gray.She cannot take doxycycline , so she is going to fill the z pack prescription and complete it.She cannot take prednisone, and she is not actively wheezing, so we will treat with ABX and re-evaluate. If needed we will try medrol to treat.She states she has been having issues with high BP and tachycardia. She has follow up with Dr. Gwenlyn Found.  She states she is doing well with her new CPAP machine and states she is compliant with therapy.  She endorses less nighttime awakening, improved alertness during the day, and less sleepiness during the day.  She denies fever, chest pain, orthopnea, or hemoptysis.  Download 07/08/2017 through 08/06/2017 Air sense 10 AutoSet: 5 cm H2O to 12 cm H2O Compliance 100% Greater than 4 hours 100% Average usage days used 8 hours 54 minutes Median pressure 8.1 cm H2O Maximum pressure 11.4 cm H2O AHI equals 3.6  Test Results: CXR 08/05/2017>> FINDINGS: The heart size and mediastinal contours are within normal limits. Stable pulmonary hyperinflation and central peribronchial thickening, consistent with COPD. Both lungs are clear. The visualized skeletal  structures are unremarkable.  IMPRESSION: COPD.  No active cardiopulmonary disease.  CBC Latest Ref Rng & Units 08/05/2017 06/21/2017 06/20/2017  WBC 4.0 - 10.5 K/uL 11.3(H) 12.9(H) 14.1(H)  Hemoglobin 12.0 - 15.0 g/dL 12.3 9.3(L) 9.9(L)  Hematocrit 36.0 - 46.0 % 37.7 29.0(L) 30.9(L)  Platelets 150.0 - 400.0 K/uL 341.0 239 240    BMP Latest Ref Rng & Units 08/05/2017 06/20/2017 06/19/2017  Glucose 70 - 99 mg/dL 97 129(H) 134(H)  BUN 6 - 23 mg/dL 17 15 16   Creatinine 0.40 - 1.20 mg/dL 1.10 0.92 1.00  Sodium 135 - 145 mEq/L 143 139 141  Potassium 3.5 - 5.1 mEq/L 4.3 3.7 3.7  Chloride 96 - 112 mEq/L 105 104 105  CO2 19 - 32 mEq/L 32 24 26  Calcium 8.4 - 10.5 mg/dL 9.4 8.5(L) 8.5(L)    BNP No results found for: BNP  ProBNP No results found for: PROBNP  PFT    Component Value Date/Time   FEV1PRE 1.05 10/30/2016 0847   FEV1POST 1.23 10/30/2016 0847   FVCPRE 2.16 10/30/2016 0847   FVCPOST 2.53 10/30/2016 0847   TLC 5.36 10/30/2016 0847   DLCOUNC 9.40 10/30/2016 0847   PREFEV1FVCRT 49 10/30/2016 0847   PSTFEV1FVCRT 49 10/30/2016 0847    Dg Chest 2 View  Result Date: 08/06/2017 CLINICAL DATA:  Productive cough for 2 weeks.  Emphysema. EXAM: CHEST - 2 VIEW COMPARISON:  02/03/2017 FINDINGS: The heart size and mediastinal contours are within normal limits.  Stable pulmonary hyperinflation and central peribronchial thickening, consistent with COPD. Both lungs are clear. The visualized skeletal structures are unremarkable. IMPRESSION: COPD.  No active cardiopulmonary disease. Electronically Signed   By: Earle Gell M.D.   On: 08/06/2017 12:57     Past medical hx Past Medical History:  Diagnosis Date  . Alcoholism and drug addiction in family   . Anxiety   . Arthritis    "knees, hips, lower back" (09/25/2016)  . Asthma   . Bipolar disorder ([place])   . Chronic bronchitis ([place])    "all the time" (09/25/2016)  . Chronic leg pain    RLE  . Chronic lower back pain   . COPD (chronic  obstructive pulmonary disease) ([place])   . Depression   . Emphysema of lung ([place])   . GERD (gastroesophageal reflux disease)   . Headache   . Hepatitis C    "treated back in 2001-2002"  . High cholesterol   . Hypertension   . Hypothyroidism   . Incarcerated ventral hernia 04/04/2017  . On home oxygen therapy    "2L; at nighttime" (04/04/2017)  . OSA on CPAP   . Pneumonia    "all the time" (09/25/2016)  . Positive PPD    with negative chest x ray  . Tachycardia    patient reports with exertion ; denies any other conditions   . Thyroid disease   . Type II diabetes mellitus (HCC)      Social History   Tobacco Use  . Smoking status: Former Smoker    Packs/day: 1.00    Years: 44.00    Pack years: 44.00    Types: Cigarettes    Last attempt to quit: 11/22/2016    Years since quitting: 0.7  . Smokeless tobacco: Never Used  . Tobacco comment: pt quit smoking 11/22/16 and currently taking chantix  (04/04/2017)  Substance Use Topics  . Alcohol use: Yes    Comment: 04/04/2017 "quit years ago"  . Drug use: Yes    Types: "Crack" cocaine    Comment: 04/04/2017 "nothing since 01/12/2012"    Ms.Parodi reports that she quit smoking about 8 months ago. Her smoking use included cigarettes. She has a 44.00 pack-year smoking history. She has never used smokeless tobacco. She reports that she drinks alcohol. She reports that she has current or past drug history. Drug: "Crack" cocaine.  Tobacco Cessation:   Pt. quit smoking 11/22/16 and currently taking chantix  (04/04/2017)  Past surgical hx, Family hx, Social hx all reviewed.  Current Outpatient Medications on File Prior to Visit  Medication Sig  . atenolol (TENORMIN) 25 MG tablet Take 1 tablet (25 mg total) by mouth daily.  Marland Kitchen atorvastatin (LIPITOR) 20 MG tablet Take 20 mg by mouth daily.  Marland Kitchen azithromycin (ZITHROMAX) 250 MG tablet 2 tabs po qd x 1 day; 1 tablet per day x 4 days;  . baclofen (LIORESAL) 10 MG tablet Take 10 mg by mouth at bedtime.   .  diphenhydrAMINE (BENADRYL) 25 mg capsule Take 25 mg by mouth 2 (two) times daily.   Marland Kitchen docusate sodium (COLACE) 100 MG capsule Take 1 capsule (100 mg total) by mouth 2 (two) times daily.  . hydrochlorothiazide (HYDRODIURIL) 25 MG tablet   . levothyroxine (SYNTHROID, LEVOTHROID) 25 MCG tablet Take 1 tablet (25 mcg total) by mouth daily before breakfast.  . Melatonin 5 MG CAPS Take 1 capsule (5 mg total) by mouth at bedtime.  . methocarbamol (ROBAXIN) 500 MG tablet Take 1 tablet (500  mg total) by mouth every 6 (six) hours as needed for muscle spasms.  . naproxen (NAPROSYN) 500 MG tablet Take 500 mg by mouth 2 (two) times daily with a meal.  . nystatin-triamcinolone (MYCOLOG II) cream nystatin-triamcinolone 100,000 unit/g-0.1 % topical cream  . nystatin-triamcinolone ointment (MYCOLOG) Apply 1 application topically 2 (two) times daily as needed (applied to skin folds/groins/under breast skin irritation rash.).  Marland Kitchen pantoprazole (PROTONIX) 40 MG tablet Take 40 mg by mouth 2 (two) times daily.  . pregabalin (LYRICA) 75 MG capsule Take 1 capsule (75 mg total) by mouth 2 (two) times daily.  . valACYclovir (VALTREX) 500 MG tablet Take 500 mg by mouth daily.   . varenicline (CHANTIX CONTINUING MONTH PAK) 1 MG tablet Take 1 tablet (1 mg total) by mouth 2 (two) times daily.  Marland Kitchen venlafaxine XR (EFFEXOR-XR) 150 MG 24 hr capsule Take 1 capsule (150 mg total) by mouth daily with breakfast.   No current facility-administered medications on file prior to visit.      Allergies  Allergen Reactions  . Keflex [Cephalexin] Other (See Comments)    unspecified  . Prednisone Other (See Comments)    "counter reacts"  . Shellfish Allergy Other (See Comments)    Stomach cramps  . Tetracyclines & Related Other (See Comments)    unspecified  . Dilaudid [Hydromorphone Hcl] Other (See Comments)    "Lethargy"  . Incruse Ellipta [Umeclidinium Bromide] Nausea Only  . Tuberculin Tests Other (See Comments)    Review Of  Systems:  Constitutional:   No  weight loss, night sweats,  Fevers, chills, fatigue, or  lassitude.  HEENT:   No headaches,  Difficulty swallowing,  Tooth/dental problems, or  Sore throat,                No sneezing, itching, ear ache, nasal congestion, post nasal drip,   CV:  No chest pain,  Orthopnea, PND, swelling in lower extremities, anasarca, dizziness, palpitations, syncope.   GI  No heartburn, indigestion, abdominal pain, nausea, vomiting, diarrhea, change in bowel habits, loss of appetite, bloody stools.   Resp: + shortness of breath with exertion or at rest.  + excess mucus, + productive cough,  No non-productive cough,  No coughing up of blood.  + change in color of mucus.  + wheezing.  No chest wall deformity  Skin: no rash or lesions.  GU: no dysuria, change in color of urine, no urgency or frequency.  No flank pain, no hematuria   MS:  No joint pain or swelling.  No decreased range of motion.  No back pain.  Psych:  No change in mood or affect. No depression or anxiety.  No memory loss.   Vital Signs BP 134/78 (BP Location: Left Arm, Cuff Size: Normal)   Pulse 85   Ht 5\' 8"  (1.727 m)   Wt 215 lb 9.6 oz (97.8 kg)   SpO2 94%   BMI 32.78 kg/m    Physical Exam:  General- No distress,  A&Ox3, pleasant ENT: No sinus tenderness, TM clear, pale nasal mucosa, no oral exudate,+ post nasal drip, no LAN Cardiac: S1, S2, regular rate and rhythm, no murmur Chest: No wheeze/ rales/ dullness; no accessory muscle use, no nasal flaring, no sternal retractions, prolonged expiratory phase Abd.: Soft Non-tender, nondistended, bowel sounds positive,Body mass index is 32.78 kg/m. Ext: No clubbing cyanosis, edema Neuro:  normal strength Skin: No rashes, warm and dry Psych: normal mood and behavior   Assessment/Plan  COPD with chronic bronchitis and  emphysema (HCC) Mild flare Plan Use the Z Pack as prescribed by your PCP 2 days ago. Mucinex 1200 mg once daily with a full  glass of water. Continue Symbicort and Daliresp as you have been doing. Continue albuterol nebs 3 times daily while you are flaring. Continue Singulair daily Continue Pro Air as needed for rescue. Please renew prescription ( 90 day supply with refills) Follow up with Dr. Gwenlyn Found as scheduled June 18th. Wear your oxygen at 2 L [place] with exertion  Saturation goals are 88-92% We will refer to pulmonary rehab once cleared from a hip perspective. Note your daily symptoms > remember "red flags" for COPD:  Increase in cough, increase in sputum production, increase in shortness of breath or activity tolerance. If you notice these symptoms, please call to be seen.   Note your daily symptoms > remember "red flags" for COPD:  Increase in cough, increase in sputum production, increase in shortness of breath or activity tolerance. If you notice these symptoms, please call to be seen. Follow up with Judson Roch NP or Dr.Alva in 4-6 weeks to make sure you are better. Please contact office for sooner follow up if symptoms do not improve or worsen or seek emergency care      OSA on CPAP 100% compliance AHI well-controlled on auto set 5 cm to 12 cm H2O Plan Continue on CPAP at bedtime. You appear to be benefiting from the treatment Goal is to wear for at least 6 hours each night for maximal clinical benefit. Continue to work on weight loss, as the link between excess weight  and sleep apnea is well established.  Do not drive if sleepy. Remember to clean mask, tubing, filter, and reservoir once weekly with soapy water.        [name], NP 08/07/2017  1:58 PM

## 2017-08-07 NOTE — Assessment & Plan Note (Signed)
100% compliance AHI well-controlled on auto set 5 cm to 12 cm H2O Plan Continue on CPAP at bedtime. You appear to be benefiting from the treatment Goal is to wear for at least 6 hours each night for maximal clinical benefit. Continue to work on weight loss, as the link between excess weight  and sleep apnea is well established.  Do not drive if sleepy. Remember to clean mask, tubing, filter, and reservoir once weekly with soapy water.

## 2017-08-07 NOTE — Patient Instructions (Addendum)
It is good to see you today. Use the Z Pack as prescribed by your PCP 2 days ago. Mucinex 1200 mg once daily with a full glass of water. Continue Symbicort and Daliresp as you have been doing. Continue albuterol nebs 3 times daily while you are flaring. Continue Singulair daily Continue Pro Air as needed for rescue. Please renew prescription ( 90 day supply with refills) Follow up with Dr. Gwenlyn Found as scheduled June 18th. Wear your oxygen at 2 L [place] with exertion  Saturation goals are 88-92% We will refer to pulmonary rehab once cleared from a hip perspective. Note your daily symptoms > remember "red flags" for COPD:  Increase in cough, increase in sputum production, increase in shortness of breath or activity tolerance. If you notice these symptoms, please call to be seen.   Note your daily symptoms > remember "red flags" for COPD:  Increase in cough, increase in sputum production, increase in shortness of breath or activity tolerance. If you notice these symptoms, please call to be seen. Follow up with Judson Roch NP or Dr.Alva in 4-6 weeks to make sure you are better. Please contact office for sooner follow up if symptoms do not improve or worsen or seek emergency care    Continue on CPAP at bedtime. You appear to be benefiting from the treatment Goal is to wear for at least 6 hours each night for maximal clinical benefit. Continue to work on weight loss, as the link between excess weight  and sleep apnea is well established.  Do not drive if sleepy. Remember to clean mask, tubing, filter, and reservoir once weekly with soapy water.

## 2017-08-08 DIAGNOSIS — J9611 Chronic respiratory failure with hypoxia: Secondary | ICD-10-CM | POA: Diagnosis not present

## 2017-08-08 DIAGNOSIS — J441 Chronic obstructive pulmonary disease with (acute) exacerbation: Secondary | ICD-10-CM | POA: Diagnosis not present

## 2017-08-08 DIAGNOSIS — J449 Chronic obstructive pulmonary disease, unspecified: Secondary | ICD-10-CM | POA: Diagnosis not present

## 2017-08-11 DIAGNOSIS — R269 Unspecified abnormalities of gait and mobility: Secondary | ICD-10-CM | POA: Diagnosis not present

## 2017-08-11 DIAGNOSIS — M6281 Muscle weakness (generalized): Secondary | ICD-10-CM | POA: Diagnosis not present

## 2017-08-11 DIAGNOSIS — R69 Illness, unspecified: Secondary | ICD-10-CM | POA: Diagnosis not present

## 2017-08-12 ENCOUNTER — Encounter: Payer: Self-pay | Admitting: Cardiovascular Disease

## 2017-08-12 ENCOUNTER — Ambulatory Visit (INDEPENDENT_AMBULATORY_CARE_PROVIDER_SITE_OTHER): Payer: Medicare HMO | Admitting: Cardiovascular Disease

## 2017-08-12 VITALS — BP 136/98 | HR 80 | Ht 68.0 in | Wt 216.8 lb

## 2017-08-12 DIAGNOSIS — I10 Essential (primary) hypertension: Secondary | ICD-10-CM

## 2017-08-12 DIAGNOSIS — R0789 Other chest pain: Secondary | ICD-10-CM

## 2017-08-12 DIAGNOSIS — I6529 Occlusion and stenosis of unspecified carotid artery: Secondary | ICD-10-CM

## 2017-08-12 DIAGNOSIS — E785 Hyperlipidemia, unspecified: Secondary | ICD-10-CM | POA: Diagnosis not present

## 2017-08-12 NOTE — Assessment & Plan Note (Signed)
History of sleep apnea on BiPAP

## 2017-08-12 NOTE — Assessment & Plan Note (Signed)
History of COPD with ongoing tobacco abuse on bronchodilators and nocturnal oxygen.

## 2017-08-12 NOTE — Assessment & Plan Note (Signed)
Hypertension blood pressure measured at 136/98 she is on atenolol and hydrochlorothiazide

## 2017-08-12 NOTE — Progress Notes (Signed)
08/12/2017 Joan [name]   February 06, 1961  [id number]  Primary Physician Joan Koch, MD Primary Cardiologist: Joan Harp MD Joan [name], [place]  HPI:  Joan [name] is a 57 y.o. moderately overweight widowed Caucasian female mother of 33, grandmother of 5 grandchildren referred by Dr. Sharlet [name] for cardiovascular evaluation because of chest pressure.  He recently relocated from the [place] area down to [place] to be closer to family.  She lives with her brother Joan [name].  She is been disabled since 2000 because of bipolar disorder.  Her cardiac risk factors include close to 100 pack years of tobacco abuse currently smoking 1 pack/week as well as treated hypertension and hyperlipidemia.  She has a strong family history of heart disease with mother who had stents as well as all 5 sisters.  She is never had a heart attack or stroke.  She is chronically short of breath most likely because of COPD with ongoing tobacco abuse and has complained of chest pressure over the last several months.  She was also told that she had a 50% right carotid artery stenosis by remote ultrasound.   Current Meds  Medication Sig  . albuterol (PROAIR HFA) 108 (90 Base) MCG/ACT inhaler Inhale 1-2 puffs into the lungs every 6 (six) hours as needed for wheezing or shortness of breath.  Marland Kitchen albuterol (PROVENTIL) (2.5 MG/3ML) 0.083% nebulizer solution Take 3 mLs (2.5 mg total) by nebulization 2 (two) times daily. And every 6 hours as needed  . atenolol (TENORMIN) 25 MG tablet Take 1 tablet (25 mg total) by mouth daily.  Marland Kitchen atorvastatin (LIPITOR) 20 MG tablet Take 20 mg by mouth daily.  . baclofen (LIORESAL) 10 MG tablet Take 10 mg by mouth at bedtime.   . diphenhydrAMINE (BENADRYL) 25 mg capsule Take 25 mg by mouth 2 (two) times daily.   . hydrochlorothiazide (HYDRODIURIL) 25 MG tablet   . levothyroxine (SYNTHROID, LEVOTHROID) 25 MCG tablet Take 1 tablet (25 mcg total) by mouth daily before breakfast.  .  Melatonin 5 MG CAPS Take 1 capsule (5 mg total) by mouth at bedtime.  . methocarbamol (ROBAXIN) 500 MG tablet Take 1 tablet (500 mg total) by mouth every 6 (six) hours as needed for muscle spasms.  . montelukast (SINGULAIR) 10 MG tablet Take 1 tablet (10 mg total) by mouth at bedtime.  . naproxen (NAPROSYN) 500 MG tablet Take 500 mg by mouth 2 (two) times daily with a meal.  . nystatin-triamcinolone (MYCOLOG II) cream nystatin-triamcinolone 100,000 unit/g-0.1 % topical cream  . nystatin-triamcinolone ointment (MYCOLOG) Apply 1 application topically 2 (two) times daily as needed (applied to skin folds/groins/under breast skin irritation rash.).  Marland Kitchen pantoprazole (PROTONIX) 40 MG tablet Take 40 mg by mouth 2 (two) times daily.  . pregabalin (LYRICA) 75 MG capsule Take 1 capsule (75 mg total) by mouth 2 (two) times daily.  . roflumilast (DALIRESP) 500 MCG TABS tablet Take 1 tablet (500 mcg total) by mouth daily.  . SYMBICORT 160-4.5 MCG/ACT inhaler Inhale 2 puffs into the lungs 2 (two) times daily.  . valACYclovir (VALTREX) 500 MG tablet Take 500 mg by mouth daily.   . varenicline (CHANTIX CONTINUING MONTH PAK) 1 MG tablet Take 1 tablet (1 mg total) by mouth 2 (two) times daily.  Marland Kitchen venlafaxine XR (EFFEXOR-XR) 150 MG 24 hr capsule Take 1 capsule (150 mg total) by mouth daily with breakfast.     Allergies  Allergen Reactions  . Keflex [Cephalexin] Other (See Comments)  unspecified  . Prednisone Other (See Comments)    "counter reacts"  . Shellfish Allergy Other (See Comments)    Stomach cramps  . Tetracyclines & Related Other (See Comments)    unspecified  . Dilaudid [Hydromorphone Hcl] Other (See Comments)    "Lethargy"  . Incruse Ellipta [Umeclidinium Bromide] Nausea Only  . Tuberculin Tests Other (See Comments)    Social History   Socioeconomic History  . Marital status: Widowed    Spouse name: Not on file  . Number of children: Not on file  . Years of education: Not on file  .  Highest education level: Not on file  Occupational History  . Not on file  Social Needs  . Financial resource strain: Not on file  . Food insecurity:    Worry: Not on file    Inability: Not on file  . Transportation needs:    Medical: Not on file    Non-medical: Not on file  Tobacco Use  . Smoking status: Former Smoker    Packs/day: 1.00    Years: 44.00    Pack years: 44.00    Types: Cigarettes    Last attempt to quit: 11/22/2016    Years since quitting: 0.7  . Smokeless tobacco: Never Used  . Tobacco comment: pt quit smoking 11/22/16 and currently taking chantix  (04/04/2017)  Substance and Sexual Activity  . Alcohol use: Yes    Comment: 04/04/2017 "quit years ago"  . Drug use: Yes    Types: "Crack" cocaine    Comment: 04/04/2017 "nothing since 01/12/2012"  . Sexual activity: Never  Lifestyle  . Physical activity:    Days per week: Not on file    Minutes per session: Not on file  . Stress: Not on file  Relationships  . Social connections:    Talks on phone: Not on file    Gets together: Not on file    Attends religious service: Not on file    Active member of club or organization: Not on file    Attends meetings of clubs or organizations: Not on file    Relationship status: Not on file  . Intimate partner violence:    Fear of current or ex partner: Not on file    Emotionally abused: Not on file    Physically abused: Not on file    Forced sexual activity: Not on file  Other Topics Concern  . Not on file  Social History Narrative  . Not on file     Review of Systems: General: negative for chills, fever, night sweats or weight changes.  Cardiovascular: negative for chest pain, dyspnea on exertion, edema, orthopnea, palpitations, paroxysmal nocturnal dyspnea or shortness of breath Dermatological: negative for rash Respiratory: negative for cough or wheezing Urologic: negative for hematuria Abdominal: negative for nausea, vomiting, diarrhea, bright red blood per rectum,  melena, or hematemesis Neurologic: negative for visual changes, syncope, or dizziness All other systems reviewed and are otherwise negative except as noted above.    Blood pressure (!) 136/98, pulse 80, height 5\' 8"  (1.727 m), weight 216 lb 12.8 oz (98.3 kg), SpO2 97 %.  General appearance: alert and no distress Neck: no adenopathy, no carotid bruit, no JVD, supple, symmetrical, trachea midline and thyroid not enlarged, symmetric, no tenderness/mass/nodules Lungs: clear to auscultation bilaterally Heart: regular rate and rhythm, S1, S2 normal, no murmur, click, rub or gallop Extremities: extremities normal, atraumatic, no cyanosis or edema Pulses: 2+ and symmetric Skin: Skin color, texture, turgor normal. No rashes  or lesions Neurologic: Alert and oriented X 3, normal strength and tone. Normal symmetric reflexes. Normal coordination and gait  EKG not performed today  ASSESSMENT AND PLAN:   Essential hypertension Hypertension blood pressure measured at 136/98 she is on atenolol and hydrochlorothiazide  OSA treated with BiPAP History of sleep apnea on BiPAP  COPD with chronic bronchitis and emphysema (HCC) History of COPD with ongoing tobacco abuse on bronchodilators and nocturnal oxygen.  Chest pressure She had chest pressure over the last several months occurring fairly randomly.  She does have strong family history of heart disease with her mother and all 5 sisters having known ischemic heart disease.  Her risk factors include tobacco abuse, treated hypertension and hyperlipidemia.  She does have normal LV function 2D echocardiogram performed 09/27/2016.  I am going to get a pharmacologic Myoview stress test to further evaluate.      Joan Harp MD FACP,FACC,FAHA, [facility] 08/12/2017 11:55 AM

## 2017-08-12 NOTE — Assessment & Plan Note (Signed)
She had chest pressure over the last several months occurring fairly randomly.  She does have strong family history of heart disease with her mother and all 5 sisters having known ischemic heart disease.  Her risk factors include tobacco abuse, treated hypertension and hyperlipidemia.  She does have normal LV function 2D echocardiogram performed 09/27/2016.  I am going to get a pharmacologic Myoview stress test to further evaluate.

## 2017-08-12 NOTE — Patient Instructions (Signed)
Medication Instructions: Your physician recommends that you continue on your current medications as directed. Please refer to the Current Medication list given to you today.  Labwork: Your physician recommends that you return for a FASTING lipid profile and hepatic function panel at your earliest convenience.  Testing/Procedures: Your physician has requested that you have a carotid duplex. This test is an ultrasound of the carotid arteries in your neck. It looks at blood flow through these arteries that supply the brain with blood. Allow one hour for this exam. There are no restrictions or special instructions.  Your physician has requested that you have a lexiscan myoview. For further information please visit [email or web address]. Please follow instruction sheet, as given.  Your physician has recommended that you wear a 14 day event monitor. Event monitors are medical devices that record the heart's electrical activity. Doctors most often [place] these monitors to diagnose arrhythmias. Arrhythmias are problems with the speed or rhythm of the heartbeat. The monitor is a small, portable device. You can wear one while you do your normal daily activities. This is usually used to diagnose what is causing palpitations/syncope (passing out).  Follow-Up: Your physician recommends that you schedule a follow-up appointment after testing with Dr. Gwenlyn Found.  If you need a refill on your cardiac medications before your next appointment, please call your pharmacy.

## 2017-08-14 ENCOUNTER — Encounter: Payer: Self-pay | Admitting: Cardiovascular Disease

## 2017-08-14 ENCOUNTER — Telehealth (HOSPITAL_COMMUNITY): Payer: Self-pay

## 2017-08-14 NOTE — Telephone Encounter (Signed)
Pt aware may take am meds but per pt is not going to .Joan [name]

## 2017-08-14 NOTE — Telephone Encounter (Signed)
Encounter complete. 

## 2017-08-15 ENCOUNTER — Other Ambulatory Visit: Payer: Self-pay | Admitting: Internal Medicine

## 2017-08-16 DIAGNOSIS — G4700 Insomnia, unspecified: Secondary | ICD-10-CM | POA: Diagnosis not present

## 2017-08-16 DIAGNOSIS — E785 Hyperlipidemia, unspecified: Secondary | ICD-10-CM | POA: Diagnosis not present

## 2017-08-16 DIAGNOSIS — B009 Herpesviral infection, unspecified: Secondary | ICD-10-CM | POA: Diagnosis not present

## 2017-08-16 DIAGNOSIS — J9610 Chronic respiratory failure, unspecified whether with hypoxia or hypercapnia: Secondary | ICD-10-CM | POA: Diagnosis not present

## 2017-08-16 DIAGNOSIS — R69 Illness, unspecified: Secondary | ICD-10-CM | POA: Diagnosis not present

## 2017-08-16 DIAGNOSIS — E669 Obesity, unspecified: Secondary | ICD-10-CM | POA: Diagnosis not present

## 2017-08-16 DIAGNOSIS — J439 Emphysema, unspecified: Secondary | ICD-10-CM | POA: Diagnosis not present

## 2017-08-16 DIAGNOSIS — E039 Hypothyroidism, unspecified: Secondary | ICD-10-CM | POA: Diagnosis not present

## 2017-08-19 ENCOUNTER — Other Ambulatory Visit: Payer: Self-pay | Admitting: Pulmonary Disease

## 2017-08-19 ENCOUNTER — Ambulatory Visit (HOSPITAL_COMMUNITY)
Admission: RE | Admit: 2017-08-19 | Discharge: 2017-08-19 | Disposition: A | Discharge status: Home / Self Care | Payer: Medicare HMO | Source: Ambulatory Visit | Attending: Cardiovascular Disease | Admitting: Cardiovascular Disease

## 2017-08-19 ENCOUNTER — Encounter: Payer: Self-pay | Admitting: Internal Medicine

## 2017-08-19 ENCOUNTER — Encounter (HOSPITAL_COMMUNITY): Payer: Self-pay

## 2017-08-19 DIAGNOSIS — R0789 Other chest pain: Secondary | ICD-10-CM

## 2017-08-19 DIAGNOSIS — I6529 Occlusion and stenosis of unspecified carotid artery: Secondary | ICD-10-CM

## 2017-08-19 DIAGNOSIS — E785 Hyperlipidemia, unspecified: Secondary | ICD-10-CM | POA: Diagnosis not present

## 2017-08-19 LAB — LIPID PANEL
Chol/HDL Ratio: 3.1 ratio (ref 0.0–4.4)
Cholesterol, Total: 134 mg/dL (ref 100–199)
HDL: 43 mg/dL (ref >39)
LDL Calculated: 62 mg/dL (ref 0–99)
Triglycerides: 147 mg/dL (ref 0–149)
VLDL Cholesterol Cal: 29 mg/dL (ref 5–40)

## 2017-08-19 LAB — MYOCARDIAL PERFUSION IMAGING
LV dias vol: 78 mL (ref 46–106)
LV sys vol: 31 mL
Peak HR: 90 {beats}/min
Rest HR: 75 {beats}/min
SDS: 3
SRS: 2
SSS: 5
TID: 0.99

## 2017-08-19 LAB — HEPATIC FUNCTION PANEL
ALT: 13 IU/L (ref 0–32)
AST: 12 IU/L (ref 0–40)
Albumin: 4.5 g/dL (ref 3.5–5.5)
Alkaline Phosphatase: 157 IU/L — ABNORMAL HIGH (ref 39–117)
Bilirubin Total: 0.3 mg/dL (ref 0.0–1.2)
Bilirubin, Direct: 0.09 mg/dL (ref 0.00–0.40)
Total Protein: 6.5 g/dL (ref 6.0–8.5)

## 2017-08-19 MED ORDER — TECHNETIUM TC 99M TETROFOSMIN IV KIT
32.0000 | PACK | Freq: Once | INTRAVENOUS | Status: AC | PRN
  Administered 2017-08-19: 32 via INTRAVENOUS
  Filled 2017-08-19: qty 32

## 2017-08-19 MED ORDER — REGADENOSON 0.4 MG/5ML IV SOLN
0.4000 mg | Freq: Once | INTRAVENOUS | Status: AC
  Administered 2017-08-19: 0.4 mg via INTRAVENOUS

## 2017-08-19 MED ORDER — AMINOPHYLLINE 25 MG/ML IV SOLN
100.0000 mg | Freq: Once | INTRAVENOUS | Status: AC
  Administered 2017-08-19: 100 mg via INTRAVENOUS

## 2017-08-19 MED ORDER — TECHNETIUM TC 99M TETROFOSMIN IV KIT
10.6000 | PACK | Freq: Once | INTRAVENOUS | Status: AC | PRN
  Administered 2017-08-19: 10.6 via INTRAVENOUS
  Filled 2017-08-19: qty 11

## 2017-08-19 MED ORDER — ALBUTEROL SULFATE HFA 108 (90 BASE) MCG/ACT IN AERS: 2.0000 | INHALATION_SPRAY | Freq: Four times a day (QID) | RESPIRATORY_TRACT | 0 refills | Status: DC | PRN

## 2017-08-20 ENCOUNTER — Telehealth: Payer: Self-pay | Admitting: Internal Medicine

## 2017-08-20 ENCOUNTER — Telehealth: Payer: Self-pay | Admitting: Acute Care

## 2017-08-20 ENCOUNTER — Other Ambulatory Visit: Payer: Self-pay

## 2017-08-20 MED ORDER — VALACYCLOVIR HCL 500 MG PO TABS: 500.0000 mg | ORAL_TABLET | Freq: Every day | ORAL | 1 refills | Status: DC

## 2017-08-20 NOTE — Telephone Encounter (Signed)
Called [facility] and spoke with Cori Razor, pharmacy tech regarding the phone call we had received from [facility] wanting clarification of pt's directions on med.  Cori Razor stated there was an order that was received for Proair on 6/13 and then an order for Ventolin that was received today, 6/25 and wanted to know if the ProAir should be filled for pt or the Ventolin.  I stated to [facility] at pt's last OV 6/13, it as stated for pt to continue using the Proair inhaler as needed.  [name] expressed understanding and was going to discontinue the Ventolin script and then verify instructions with the pharmacist for the [place].  Instructions for ProAir had been verified and nothing further is needed.

## 2017-08-20 NOTE — Telephone Encounter (Signed)
Copied from [place] [phone]. Topic: Quick Communication - See Telephone Encounter >> Aug 20, 2017  1:03 PM Rutherford Nail, NT wrote: CRM for notification. See Telephone encounter for: 08/20/17. Patient calling and states that the valACYclovir (VALTREX) 500 MG tablet was sent to the wrong pharmacy. States that it needs to be cancelled and sent to [place], [place] for a 90 day supply with refills. Please advise.

## 2017-08-20 NOTE — Telephone Encounter (Signed)
Resent to correct pharmacy.

## 2017-08-21 ENCOUNTER — Other Ambulatory Visit: Payer: Self-pay | Admitting: Cardiovascular Disease

## 2017-08-21 ENCOUNTER — Ambulatory Visit (INDEPENDENT_AMBULATORY_CARE_PROVIDER_SITE_OTHER): Payer: Medicare HMO

## 2017-08-21 DIAGNOSIS — J9611 Chronic respiratory failure with hypoxia: Secondary | ICD-10-CM | POA: Diagnosis not present

## 2017-08-21 DIAGNOSIS — R0602 Shortness of breath: Secondary | ICD-10-CM | POA: Diagnosis not present

## 2017-08-21 DIAGNOSIS — R000 Tachycardia, unspecified: Secondary | ICD-10-CM | POA: Diagnosis not present

## 2017-08-21 DIAGNOSIS — R0789 Other chest pain: Secondary | ICD-10-CM

## 2017-08-21 DIAGNOSIS — J441 Chronic obstructive pulmonary disease with (acute) exacerbation: Secondary | ICD-10-CM | POA: Diagnosis not present

## 2017-08-21 DIAGNOSIS — G4733 Obstructive sleep apnea (adult) (pediatric): Secondary | ICD-10-CM | POA: Diagnosis not present

## 2017-08-21 DIAGNOSIS — J449 Chronic obstructive pulmonary disease, unspecified: Secondary | ICD-10-CM | POA: Diagnosis not present

## 2017-08-30 ENCOUNTER — Other Ambulatory Visit: Payer: Self-pay | Admitting: Internal Medicine

## 2017-09-01 ENCOUNTER — Telehealth: Payer: Self-pay | Admitting: Acute Care

## 2017-09-01 NOTE — Telephone Encounter (Signed)
Called [facility] and spoke with Corene Cornea regarding the information stated per pt.  Per Corene Cornea, the compliance dream team dept is supposed to be pulling pt's information from Epic and are not doing it the way they should.  Corene Cornea stated he would get everything taken care of for pt and was able to see pt's 100% complaince with cpap.  Called and spoke with pt letting her know the information stated per Corene Cornea with [facility] and that he was going to get everything taken care of for her.  Pt expressed understanding. Nothing further needed.

## 2017-09-07 DIAGNOSIS — E039 Hypothyroidism, unspecified: Secondary | ICD-10-CM | POA: Diagnosis not present

## 2017-09-07 DIAGNOSIS — E119 Type 2 diabetes mellitus without complications: Secondary | ICD-10-CM | POA: Diagnosis not present

## 2017-09-07 DIAGNOSIS — M1611 Unilateral primary osteoarthritis, right hip: Secondary | ICD-10-CM | POA: Diagnosis not present

## 2017-09-07 DIAGNOSIS — M170 Bilateral primary osteoarthritis of knee: Secondary | ICD-10-CM | POA: Diagnosis not present

## 2017-09-07 DIAGNOSIS — J9611 Chronic respiratory failure with hypoxia: Secondary | ICD-10-CM | POA: Diagnosis not present

## 2017-09-07 DIAGNOSIS — G4733 Obstructive sleep apnea (adult) (pediatric): Secondary | ICD-10-CM | POA: Diagnosis not present

## 2017-09-07 DIAGNOSIS — J441 Chronic obstructive pulmonary disease with (acute) exacerbation: Secondary | ICD-10-CM | POA: Diagnosis not present

## 2017-09-07 DIAGNOSIS — J449 Chronic obstructive pulmonary disease, unspecified: Secondary | ICD-10-CM | POA: Diagnosis not present

## 2017-09-07 DIAGNOSIS — R69 Illness, unspecified: Secondary | ICD-10-CM | POA: Diagnosis not present

## 2017-09-07 DIAGNOSIS — I10 Essential (primary) hypertension: Secondary | ICD-10-CM | POA: Diagnosis not present

## 2017-09-07 DIAGNOSIS — K219 Gastro-esophageal reflux disease without esophagitis: Secondary | ICD-10-CM | POA: Diagnosis not present

## 2017-09-07 DIAGNOSIS — J439 Emphysema, unspecified: Secondary | ICD-10-CM | POA: Diagnosis not present

## 2017-09-07 NOTE — Progress Notes (Signed)
History of Present Illness Joan [name] is a 57 y.o. female  former smoker( Quit 2018)withStage 3 severe COPD,and asthma.She has been a patient of Dr. Chase [name] since 07/2016.She is followed by Dr. Elsworth [name] for OSA andBiPAP therapy.    09/08/2017  Pt. Presents for follow up. She was last seen 08/07/2017 for OSA check after receiving a new CPAP machine. She was compliant with her CPAP and  Down load confirmed 100% compliance and  benefit of treatment. At the time of the OV   she was also  having a mild COPD flare. She had been seen by her PCP the week prior to 08/07/2017 and she was prescribed a Z pack which she had not started. . I asked her to complete the Z pack as prescribed. .Additionally she is not always compliant with her oxygen use.  She returns today for follow up of her mild COPD flare.She states her breathing is at baseline. She is just on Symbicort as her insurance company will not pay for her Spiriva. She failed Incruse treatment and Ventolin treatment. She will need this to be pre-authorized. She completed the Z pack . She is using her nebs 2-3 times daily. She uses her [employer] very rarely.She denies fever, chest pain, orthopnea or hemoptysis.She is currently on a heart monitor, and has follow up with Dr. Gwenlyn [name] in August as is scheduled.She has started smoking again. I have counseled her to quit.  Test Results:  CPAP Down Load 09/08/2017  Shows 100% compliance on Auto set 5-15 cm H2o > 4 hours each night AHI is 2.3  CXR 08/05/2017>> FINDINGS: The heart size and mediastinal contours are within normal limits. Stable pulmonary hyperinflation and central peribronchial thickening, consistent with COPD. Both lungs are clear. The visualized skeletal structures are unremarkable. COPD. No active cardiopulmonary disease.  Home Sleep Test 04/2017 123 apnea events 44 obstructive apneas 62 central apneas 17 mixed apneas AHI 15.4/ hr Hypopnea 5.4 per hour Saturation nadir  83% Diagnosis: Moderate OSA with hypopnea causing oxygen desaturations>> Plan CPAP   CBC Latest Ref Rng & Units 08/05/2017 06/21/2017 06/20/2017  WBC 4.0 - 10.5 K/uL 11.3(H) 12.9(H) 14.1(H)  Hemoglobin 12.0 - 15.0 g/dL 12.3 9.3(L) 9.9(L)  Hematocrit 36.0 - 46.0 % 37.7 29.0(L) 30.9(L)  Platelets 150.0 - 400.0 K/uL 341.0 239 240    BMP Latest Ref Rng & Units 08/05/2017 06/20/2017 06/19/2017  Glucose 70 - 99 mg/dL 97 129(H) 134(H)  BUN 6 - 23 mg/dL 17 15 16   Creatinine 0.40 - 1.20 mg/dL 1.10 0.92 1.00  Sodium 135 - 145 mEq/L 143 139 141  Potassium 3.5 - 5.1 mEq/L 4.3 3.7 3.7  Chloride 96 - 112 mEq/L 105 104 105  CO2 19 - 32 mEq/L 32 24 26  Calcium 8.4 - 10.5 mg/dL 9.4 8.5(L) 8.5(L)    BNP No results [name] for: BNP  ProBNP No results [name] for: PROBNP  PFT    Component Value Date/Time   FEV1PRE 1.05 10/30/2016 0847   FEV1POST 1.23 10/30/2016 0847   FVCPRE 2.16 10/30/2016 0847   FVCPOST 2.53 10/30/2016 0847   TLC 5.36 10/30/2016 0847   DLCOUNC 9.40 10/30/2016 0847   PREFEV1FVCRT 49 10/30/2016 0847   PSTFEV1FVCRT 49 10/30/2016 0847    No results [name].   Past medical hx Past Medical History:  Diagnosis Date  . Alcoholism and drug addiction in family   . Anxiety   . Arthritis    "knees, hips, lower back" (09/25/2016)  . Asthma   .  Bipolar disorder ([place])   . Chronic bronchitis ([place])    "all the time" (09/25/2016)  . Chronic leg pain    RLE  . Chronic lower back pain   . COPD (chronic obstructive pulmonary disease) ([place])   . Depression   . Emphysema of lung ([place])   . GERD (gastroesophageal reflux disease)   . Headache   . Hepatitis C    "treated back in 2001-2002"  . High cholesterol   . Hypertension   . Hypothyroidism   . Incarcerated ventral hernia 04/04/2017  . On home oxygen therapy    "2L; at nighttime" (04/04/2017)  . OSA on CPAP   . Pneumonia    "all the time" (09/25/2016)  . Positive PPD    with negative chest x ray  . Tachycardia    patient reports with  exertion ; denies any other conditions   . Thyroid disease   . Type II diabetes mellitus (HCC)      Social History   Tobacco Use  . Smoking status: Current Every Day Smoker    Packs/day: 1.00    Years: 44.00    Pack years: 44.00    Types: Cigarettes    Last attempt to quit: 11/22/2016    Years since quitting: 0.7  . Smokeless tobacco: Never Used  Substance Use Topics  . Alcohol use: Yes  . Drug use: Yes    Types: "Crack" cocaine    Comment: 04/04/2017 "nothing since 01/12/2012"    Joan [name] reports that she has been smoking cigarettes.  She has a 44.00 pack-year smoking history. She has never used smokeless tobacco. She reports that she drinks alcohol. She reports that she has current or past drug history. Drug: "Crack" cocaine.  Tobacco Cessation: Not ready to quit, but contemplating I have spent 4 minutes counseling patient on smoking cessation this visit. Making sure she understands the health risks of continued tobacco abuse and impact it has on her ability to breath every day.She verbalized understanding.  Past surgical hx, Family hx, Social hx all reviewed.  Current Outpatient Medications on File Prior to Visit  Medication Sig  . albuterol (PROVENTIL) (2.5 MG/3ML) 0.083% nebulizer solution Take 3 mLs (2.5 mg total) by nebulization 2 (two) times daily. And every 6 hours as needed  . atenolol (TENORMIN) 25 MG tablet Take 1 tablet (25 mg total) by mouth daily.  Marland Kitchen atorvastatin (LIPITOR) 20 MG tablet Take 1 tablet (20 mg total) by mouth daily.  . baclofen (LIORESAL) 10 MG tablet Take 10 mg by mouth at bedtime.   . diphenhydrAMINE (BENADRYL) 25 mg capsule Take 25 mg by mouth 2 (two) times daily.   . hydrochlorothiazide (HYDRODIURIL) 25 MG tablet Take 1 tablet (25 mg total) by mouth daily.  Marland Kitchen levothyroxine (SYNTHROID, LEVOTHROID) 25 MCG tablet Take 1 tablet (25 mcg total) by mouth daily before breakfast.  . Melatonin 5 MG CAPS Take 1 capsule (5 mg total) by mouth at bedtime.  .  methocarbamol (ROBAXIN) 500 MG tablet Take 1 tablet (500 mg total) by mouth every 6 (six) hours as needed for muscle spasms.  . naproxen (NAPROSYN) 500 MG tablet Take 500 mg by mouth 2 (two) times daily as needed.   . nystatin-triamcinolone ointment (MYCOLOG) Apply 1 application topically 2 (two) times daily as needed (applied to skin folds/groins/under breast skin irritation rash.).  Marland Kitchen pantoprazole (PROTONIX) 40 MG tablet Take 1 tablet (40 mg total) by mouth daily.  . pregabalin (LYRICA) 75 MG capsule Take 1 capsule (75 mg  total) by mouth 2 (two) times daily.  . valACYclovir (VALTREX) 500 MG tablet Take 1 tablet (500 mg total) by mouth daily.  Marland Kitchen venlafaxine XR (EFFEXOR-XR) 150 MG 24 hr capsule TAKE 1 CAPSULE DAILY WITH  BREAKFAST   No current facility-administered medications on file prior to visit.      Allergies  Allergen Reactions  . Keflex [Cephalexin] Other (See Comments)    unspecified  . Prednisone Other (See Comments)    "counter reacts"  . Shellfish Allergy Other (See Comments)    Stomach cramps  . Tetracyclines & Related Other (See Comments)    unspecified  . Dilaudid [Hydromorphone Hcl] Other (See Comments)    "Lethargy"  . Incruse Ellipta [Umeclidinium Bromide] Nausea Only  . Tuberculin Tests Other (See Comments)    Review Of Systems:  Constitutional:   No  weight loss, night sweats,  Fevers, chills,+ fatigue, or  lassitude.  HEENT:   No headaches,  Difficulty swallowing,  Tooth/dental problems, or  Sore throat,                No sneezing, itching, ear ache, nasal congestion, post nasal drip,   CV:  No chest pain,  Orthopnea, PND, swelling in lower extremities, anasarca, dizziness, palpitations, syncope.   GI  No heartburn, indigestion, abdominal pain, nausea, vomiting, diarrhea, change in bowel habits, loss of appetite, bloody stools.   Resp: + shortness of breath with exertion or at rest.  Baseline  excess mucus, no productive cough,  No non-productive cough,  No  coughing up of blood.  Baseline   color of mucus.  + wheezing.  No chest wall deformity  Skin: no rash or lesions.  GU: no dysuria, change in color of urine, no urgency or frequency.  No flank pain, no hematuria   MS:  No joint pain or swelling.  No decreased range of motion.  No back pain.  Psych:  No change in mood or affect. No depression or anxiety.  No memory loss.   Vital Signs BP 110/72 (BP Location: Right Arm, Cuff Size: Normal)   Pulse 70   Ht 5\' 8"  (1.727 m)   Wt 215 lb 12.8 oz (97.9 kg)   SpO2 95%   BMI 32.81 kg/m    Physical Exam:  General- No distress,  A&Ox3, pleasant ENT: No sinus tenderness, TM clear, pale nasal mucosa, no oral exudate,no post nasal drip, no LAN Cardiac: S1, S2, regular rate and rhythm, no murmur Chest: + wheeze/ No rales/ dullness; no accessory muscle use, no nasal flaring, no sternal retractions Abd.: Soft Non-tender, ND, BS +, Body mass index is 32.81 kg/m. Ext: No clubbing cyanosis, edema Neuro:  normal strength, walks with a cane due to recent hip surgery Skin: No rashes, warm and dry Psych: normal mood and behavior   Assessment/Plan  COPD with chronic bronchitis and emphysema (HCC) Baseline Needs to use Spiriva in addition to [employer] has refused Spiriva despite failing Incruse and Ventolin Plan: QUIT SMOKING It is good to see you today. Continue Symbicort and Daliresp as you have been doing. Rinse mouth after use We will see what we need to do to get your your Spiriva Continue albuterol nebs 3 times daily  We will send in a prescription for the Xopenex nebs  Use these twice daily as needed. Continue Singulair daily We will send in a prescription for your Singulair Continue Pro Air as needed for rescue. Wear your oxygen at 2 L [place] with exertion  Saturation goals  are 88-92% Note your daily symptoms >remember "red flags" for COPD: Increase in cough, increase in sputum production, increase in shortness of breath or  activity intolerance. If you notice these symptoms, please call to be seen.  Follow up with Judson Roch NP or Dr.Alva in 4-6 weeks to make sure you are better. Please contact office for sooner follow up if symptoms do not improve or worsen or seek emergency care     OSA treated with BiPAP 100% compliant with her CPAP Plan: Continue on CPAP at bedtime. You appear to be benefiting from the treatment Goal is to wear for at least 6 hours each night for maximal clinical benefit. Continue to work on weight loss, as the link between excess weight  and sleep apnea is well established.  Do not drive if sleepy. Remember to clean mask, tubing, filter, and reservoir once weekly with soapy water.  Follow up with Judson Roch NP of Dr. Elsworth [name] in 6 months or before as needed.   Chronic respiratory failure with hypoxia (HCC) Saturation goals are 88-92% Wear oxygen to maintain these saturation goals    [name], NP 09/08/2017  2:58 PM

## 2017-09-08 ENCOUNTER — Encounter: Payer: Self-pay | Admitting: Internal Medicine

## 2017-09-08 ENCOUNTER — Ambulatory Visit: Payer: Medicare HMO | Admitting: Acute Care

## 2017-09-08 ENCOUNTER — Ambulatory Visit (INDEPENDENT_AMBULATORY_CARE_PROVIDER_SITE_OTHER): Payer: Medicare HMO | Admitting: Internal Medicine

## 2017-09-08 ENCOUNTER — Encounter: Payer: Self-pay | Admitting: Acute Care

## 2017-09-08 VITALS — BP 118/70 | HR 76 | Temp 98.4°F | Ht 68.0 in | Wt 215.0 lb

## 2017-09-08 DIAGNOSIS — J449 Chronic obstructive pulmonary disease, unspecified: Secondary | ICD-10-CM

## 2017-09-08 DIAGNOSIS — J9611 Chronic respiratory failure with hypoxia: Secondary | ICD-10-CM

## 2017-09-08 DIAGNOSIS — G4733 Obstructive sleep apnea (adult) (pediatric): Secondary | ICD-10-CM | POA: Diagnosis not present

## 2017-09-08 DIAGNOSIS — K219 Gastro-esophageal reflux disease without esophagitis: Secondary | ICD-10-CM

## 2017-09-08 DIAGNOSIS — R69 Illness, unspecified: Secondary | ICD-10-CM | POA: Diagnosis not present

## 2017-09-08 DIAGNOSIS — R490 Dysphonia: Secondary | ICD-10-CM | POA: Diagnosis not present

## 2017-09-08 MED ORDER — TIOTROPIUM BROMIDE MONOHYDRATE 2.5 MCG/ACT IN AERS: 2.0000 | INHALATION_SPRAY | Freq: Every day | RESPIRATORY_TRACT | 0 refills | Status: DC

## 2017-09-08 MED ORDER — LEVOTHYROXINE SODIUM 25 MCG PO TABS: 25.0000 ug | ORAL_TABLET | Freq: Every day | ORAL | 3 refills | Status: DC

## 2017-09-08 MED ORDER — TIOTROPIUM BROMIDE MONOHYDRATE 2.5 MCG/ACT IN AERS: 2.0000 | INHALATION_SPRAY | Freq: Every day | RESPIRATORY_TRACT | 1 refills | Status: DC

## 2017-09-08 MED ORDER — MONTELUKAST SODIUM 10 MG PO TABS: 10.0000 mg | ORAL_TABLET | Freq: Every day | ORAL | 1 refills | Status: DC

## 2017-09-08 MED ORDER — LEVALBUTEROL HCL 0.63 MG/3ML IN NEBU: 0.6300 mg | INHALATION_SOLUTION | Freq: Two times a day (BID) | RESPIRATORY_TRACT | 1 refills | Status: DC | PRN

## 2017-09-08 MED ORDER — ALBUTEROL SULFATE HFA 108 (90 BASE) MCG/ACT IN AERS: 1.0000 | INHALATION_SPRAY | Freq: Four times a day (QID) | RESPIRATORY_TRACT | 1 refills | Status: DC | PRN

## 2017-09-08 MED ORDER — ATENOLOL 25 MG PO TABS: 25.0000 mg | ORAL_TABLET | Freq: Every day | ORAL | 3 refills | Status: DC

## 2017-09-08 MED ORDER — SYMBICORT 160-4.5 MCG/ACT IN AERO: 2.0000 | INHALATION_SPRAY | Freq: Two times a day (BID) | RESPIRATORY_TRACT | 1 refills | Status: DC

## 2017-09-08 MED ORDER — VALACYCLOVIR HCL 500 MG PO TABS: 500.0000 mg | ORAL_TABLET | Freq: Every day | ORAL | 3 refills | Status: DC

## 2017-09-08 MED ORDER — NYSTATIN-TRIAMCINOLONE 100000-0.1 UNIT/GM-% EX OINT: 1.0000 "application " | TOPICAL_OINTMENT | Freq: Two times a day (BID) | CUTANEOUS | 3 refills | Status: DC | PRN

## 2017-09-08 MED ORDER — ROFLUMILAST 500 MCG PO TABS: 500.0000 ug | ORAL_TABLET | Freq: Every day | ORAL | 1 refills | Status: DC

## 2017-09-08 MED ORDER — PANTOPRAZOLE SODIUM 40 MG PO TBEC: 40.0000 mg | DELAYED_RELEASE_TABLET | Freq: Every day | ORAL | 3 refills | Status: DC

## 2017-09-08 MED ORDER — ATORVASTATIN CALCIUM 20 MG PO TABS: 20.0000 mg | ORAL_TABLET | Freq: Every day | ORAL | 3 refills | Status: DC

## 2017-09-08 MED ORDER — HYDROCHLOROTHIAZIDE 25 MG PO TABS: 25.0000 mg | ORAL_TABLET | Freq: Every day | ORAL | 3 refills | Status: DC

## 2017-09-08 MED ORDER — VENLAFAXINE HCL ER 150 MG PO CP24: ORAL_CAPSULE | ORAL | 3 refills | Status: DC

## 2017-09-08 NOTE — Progress Notes (Signed)
   Subjective:    Patient ID: Joan [name], female    DOB: 1960/12/09, 57 y.o.   MRN: [id number]  HPI The patient is a 57 YO female coming in for pressure in her stomach and she has been having this a lot. Since her hernia surgery she is having some pressure at the edges of her stomach. She denies chest pains or palpitations. Breathing is stable and better when she uses her inhalers. She denies diarrhea or constipation. She needs refills on all medications due to loss of prescriptions from her mail order pharmacy. She is still taking protonix BID at this time. Also needs to see ENT, previously had some changes on her vocal cords. Is a current smoker although she is trying to quit again. She has cut back some. Has hoarseness chronically. She had ulcers possibly on her vocal cords in the past. She has done drugs in the past and quit about 5 years ago.   Review of Systems  Constitutional: Negative.   HENT: Positive for postnasal drip, sore throat and voice change. Negative for congestion and trouble swallowing.   Eyes: Negative.   Respiratory: Positive for cough and shortness of breath. Negative for chest tightness.   Cardiovascular: Negative for chest pain, palpitations and leg swelling.  Gastrointestinal: Negative for abdominal distention, abdominal pain, constipation, diarrhea, nausea and vomiting.       GERD  Musculoskeletal: Negative.   Skin: Negative.   Neurological: Negative.       Objective:   Physical Exam  Constitutional: She is oriented to person, place, and time. She appears well-developed and well-nourished.  HENT:  Head: Normocephalic and atraumatic.  Oropharynx with redness and clear drainage, nose with swollen turbinates, TMs normal bilaterally  Eyes: EOM are normal.  Neck: Normal range of motion. No thyromegaly present.  Cardiovascular: Normal rate and regular rhythm.  Pulmonary/Chest: Effort normal. No respiratory distress. She has no wheezes. She has no rales.  Lung exam  stable with rhonchi and some expiratory wheezing  Abdominal: Soft. Bowel sounds are normal. She exhibits no distension. There is no tenderness. There is no rebound.  Musculoskeletal: She exhibits no edema.  Lymphadenopathy:    She has no cervical adenopathy.  Neurological: She is alert and oriented to person, place, and time. Coordination normal.  Skin: Skin is warm and dry.  Psychiatric: She has a normal mood and affect.   Vitals:   09/08/17 1035  BP: 118/70  Pulse: 76  Temp: 98.4 F (36.9 C)  TempSrc: Oral  SpO2: 95%  Weight: 215 lb (97.5 kg)  Height: 5\' 8"  (1.727 m)      Assessment & Plan:

## 2017-09-08 NOTE — Assessment & Plan Note (Signed)
100% compliant with her CPAP Plan: Continue on CPAP at bedtime. You appear to be benefiting from the treatment Goal is to wear for at least 6 hours each night for maximal clinical benefit. Continue to work on weight loss, as the link between excess weight  and sleep apnea is well established.  Do not drive if sleepy. Remember to clean mask, tubing, filter, and reservoir once weekly with soapy water.  Follow up with Judson Roch NP of Dr. Elsworth Soho in 6 months or before as needed.

## 2017-09-08 NOTE — Assessment & Plan Note (Addendum)
Baseline Needs to use Spiriva in addition to [employer] has refused Spiriva despite failing Incruse and Ventolin Plan: QUIT SMOKING It is good to see you today. Continue Symbicort and Daliresp as you have been doing. Rinse mouth after use We will see what we need to do to get your your Spiriva Continue albuterol nebs 3 times daily  We will send in a prescription for the Xopenex nebs  Use these twice daily as needed. Continue Singulair daily We will send in a prescription for your Singulair Continue Pro Air as needed for rescue. Wear your oxygen at 2 L [place] with exertion  Saturation goals are 88-92% Note your daily symptoms >remember "red flags" for COPD: Increase in cough, increase in sputum production, increase in shortness of breath or activity intolerance. If you notice these symptoms, please call to be seen.  Follow up with Judson Roch NP or Dr.Alva in 4-6 weeks to make sure you are better. Please contact office for sooner follow up if symptoms do not improve or worsen or seek emergency care

## 2017-09-08 NOTE — Patient Instructions (Signed)
We would recommend gas-x or beano to try for the gas.   It is okay to add pepcid or zantac for your stomach.  We have sent in the refills of your medicines.

## 2017-09-08 NOTE — Assessment & Plan Note (Signed)
Saturation goals are 88-92% Wear oxygen to maintain these saturation goals

## 2017-09-08 NOTE — Patient Instructions (Addendum)
Quit smoking It is good to see you today.Mucinex 1200 mg once daily with a full glass of water. Continue Symbicort and Daliresp as you have been doing. Rinse mouth after use Continue albuterol nebs 3 times daily  We will send in a prescription for the Xopenex nebs  Use these twice daily as needed. Continue Singulair daily We will send in a prescription for your Singulair Continue Pro Air as needed for rescue. Wear your oxygen at 2 L  with exertion  Saturation goals are 88-92% Note your daily symptoms >remember "red flags" for COPD: Increase in cough, increase in sputum production, increase in shortness of breath or activity intolerance. If you notice these symptoms, please call to be seen.  Follow up with Judson Roch NP or Dr.Alva in 4-6 weeks to make sure you are better. Please contact office for sooner follow up if symptoms do not improve or worsen or seek emergency care

## 2017-09-09 ENCOUNTER — Encounter: Payer: Self-pay | Admitting: Internal Medicine

## 2017-09-09 ENCOUNTER — Ambulatory Visit: Payer: Medicare HMO | Admitting: Cardiovascular Disease

## 2017-09-09 DIAGNOSIS — R490 Dysphonia: Secondary | ICD-10-CM | POA: Insufficient documentation

## 2017-09-09 NOTE — Assessment & Plan Note (Signed)
Referral to ENT for assessment of her vocal cords. She is higher risk for cancer with ongoing cigarette smoking and about 40 year history of various drug usage. (stopped about 5 years ago per patient).

## 2017-09-09 NOTE — Assessment & Plan Note (Signed)
Will decrease protonix to daily as she should not be on BID long term. Can add pepcid or zantac to help. Beano or gas-x for the pressure and gas sensation.

## 2017-09-10 DIAGNOSIS — K219 Gastro-esophageal reflux disease without esophagitis: Secondary | ICD-10-CM | POA: Diagnosis not present

## 2017-09-10 DIAGNOSIS — I10 Essential (primary) hypertension: Secondary | ICD-10-CM | POA: Diagnosis not present

## 2017-09-10 DIAGNOSIS — M1611 Unilateral primary osteoarthritis, right hip: Secondary | ICD-10-CM | POA: Diagnosis not present

## 2017-09-10 DIAGNOSIS — M170 Bilateral primary osteoarthritis of knee: Secondary | ICD-10-CM | POA: Diagnosis not present

## 2017-09-10 DIAGNOSIS — G4733 Obstructive sleep apnea (adult) (pediatric): Secondary | ICD-10-CM | POA: Diagnosis not present

## 2017-09-10 DIAGNOSIS — R269 Unspecified abnormalities of gait and mobility: Secondary | ICD-10-CM | POA: Diagnosis not present

## 2017-09-10 DIAGNOSIS — J439 Emphysema, unspecified: Secondary | ICD-10-CM | POA: Diagnosis not present

## 2017-09-10 DIAGNOSIS — R69 Illness, unspecified: Secondary | ICD-10-CM | POA: Diagnosis not present

## 2017-09-10 DIAGNOSIS — E039 Hypothyroidism, unspecified: Secondary | ICD-10-CM | POA: Diagnosis not present

## 2017-09-10 DIAGNOSIS — E119 Type 2 diabetes mellitus without complications: Secondary | ICD-10-CM | POA: Diagnosis not present

## 2017-09-10 DIAGNOSIS — M6281 Muscle weakness (generalized): Secondary | ICD-10-CM | POA: Diagnosis not present

## 2017-09-12 DIAGNOSIS — M170 Bilateral primary osteoarthritis of knee: Secondary | ICD-10-CM | POA: Diagnosis not present

## 2017-09-12 DIAGNOSIS — K219 Gastro-esophageal reflux disease without esophagitis: Secondary | ICD-10-CM | POA: Diagnosis not present

## 2017-09-12 DIAGNOSIS — E119 Type 2 diabetes mellitus without complications: Secondary | ICD-10-CM | POA: Diagnosis not present

## 2017-09-12 DIAGNOSIS — E039 Hypothyroidism, unspecified: Secondary | ICD-10-CM | POA: Diagnosis not present

## 2017-09-12 DIAGNOSIS — J439 Emphysema, unspecified: Secondary | ICD-10-CM | POA: Diagnosis not present

## 2017-09-12 DIAGNOSIS — I10 Essential (primary) hypertension: Secondary | ICD-10-CM | POA: Diagnosis not present

## 2017-09-12 DIAGNOSIS — M1611 Unilateral primary osteoarthritis, right hip: Secondary | ICD-10-CM | POA: Diagnosis not present

## 2017-09-12 DIAGNOSIS — G4733 Obstructive sleep apnea (adult) (pediatric): Secondary | ICD-10-CM | POA: Diagnosis not present

## 2017-09-12 DIAGNOSIS — R69 Illness, unspecified: Secondary | ICD-10-CM | POA: Diagnosis not present

## 2017-09-15 DIAGNOSIS — Z96641 Presence of right artificial hip joint: Secondary | ICD-10-CM | POA: Diagnosis not present

## 2017-09-15 DIAGNOSIS — Z471 Aftercare following joint replacement surgery: Secondary | ICD-10-CM | POA: Diagnosis not present

## 2017-09-15 DIAGNOSIS — M1611 Unilateral primary osteoarthritis, right hip: Secondary | ICD-10-CM | POA: Diagnosis not present

## 2017-09-16 ENCOUNTER — Encounter (HOSPITAL_COMMUNITY): Payer: Medicare HMO

## 2017-09-16 DIAGNOSIS — M1611 Unilateral primary osteoarthritis, right hip: Secondary | ICD-10-CM | POA: Diagnosis not present

## 2017-09-16 DIAGNOSIS — I10 Essential (primary) hypertension: Secondary | ICD-10-CM | POA: Diagnosis not present

## 2017-09-16 DIAGNOSIS — M170 Bilateral primary osteoarthritis of knee: Secondary | ICD-10-CM | POA: Diagnosis not present

## 2017-09-16 DIAGNOSIS — R69 Illness, unspecified: Secondary | ICD-10-CM | POA: Diagnosis not present

## 2017-09-16 DIAGNOSIS — E119 Type 2 diabetes mellitus without complications: Secondary | ICD-10-CM | POA: Diagnosis not present

## 2017-09-16 DIAGNOSIS — G4733 Obstructive sleep apnea (adult) (pediatric): Secondary | ICD-10-CM | POA: Diagnosis not present

## 2017-09-16 DIAGNOSIS — E039 Hypothyroidism, unspecified: Secondary | ICD-10-CM | POA: Diagnosis not present

## 2017-09-16 DIAGNOSIS — K219 Gastro-esophageal reflux disease without esophagitis: Secondary | ICD-10-CM | POA: Diagnosis not present

## 2017-09-16 DIAGNOSIS — J439 Emphysema, unspecified: Secondary | ICD-10-CM | POA: Diagnosis not present

## 2017-09-17 ENCOUNTER — Telehealth: Payer: Self-pay | Admitting: Acute Care

## 2017-09-17 MED ORDER — IPRATROPIUM-ALBUTEROL 20-100 MCG/ACT IN AERS: 1.0000 | INHALATION_SPRAY | Freq: Four times a day (QID) | RESPIRATORY_TRACT | 3 refills | Status: DC | PRN

## 2017-09-17 NOTE — Telephone Encounter (Signed)
Pt has already tried and failed Incruse. Can she try Combivent respimat in place of Spiriva?

## 2017-09-17 NOTE — Telephone Encounter (Signed)
Called and spoke with pt and stated to her that RA said we could send script of Combivent to pt's pharmacy as she has already tried and failed Incruse and per pt's pharmacy, she needs to also try and fail another inhaler before they could consider approving Spiriva.  Pt expressed understanding. Verified pt's pharmacy and sent script of Combivent to pt's pharmacy.  Nothing further needed.

## 2017-09-17 NOTE — Telephone Encounter (Signed)
Spoke with the pt  She states [employer] says she needs PA for Spiriva 2.5  She also states the xopenex sol is also supposed to only cost her 8.25$ but they told her it would be 25$   [employer] at [phone] Spoke with Patty and advised pt has tried and failed the following based on med hx: dulera, perforomist, brovana, incruse, pulmicort, and  atrovent  Patty states pt needs to try and fail combivent respimat, which she could get for 0 $copay  I asked about the xopenex sol and was advised that the price is 29.96 and she will need to call her insurance to inquire about this.   Pt aware of the above and verbalized understanding Dr Elsworth Soho: can we call in combivent respimat in place of spiriva 2.5?

## 2017-09-17 NOTE — Telephone Encounter (Signed)
She already has Symbicort. Can trial Incruse instead of Spiriva -please give her sample and can send in prescription if this works

## 2017-09-17 NOTE — Telephone Encounter (Signed)
Ok to try combivent MDI 2puffs q6h prn

## 2017-09-18 DIAGNOSIS — R69 Illness, unspecified: Secondary | ICD-10-CM | POA: Diagnosis not present

## 2017-09-18 DIAGNOSIS — E119 Type 2 diabetes mellitus without complications: Secondary | ICD-10-CM | POA: Diagnosis not present

## 2017-09-18 DIAGNOSIS — M1611 Unilateral primary osteoarthritis, right hip: Secondary | ICD-10-CM | POA: Diagnosis not present

## 2017-09-18 DIAGNOSIS — E039 Hypothyroidism, unspecified: Secondary | ICD-10-CM | POA: Diagnosis not present

## 2017-09-18 DIAGNOSIS — M170 Bilateral primary osteoarthritis of knee: Secondary | ICD-10-CM | POA: Diagnosis not present

## 2017-09-18 DIAGNOSIS — J439 Emphysema, unspecified: Secondary | ICD-10-CM | POA: Diagnosis not present

## 2017-09-18 DIAGNOSIS — G4733 Obstructive sleep apnea (adult) (pediatric): Secondary | ICD-10-CM | POA: Diagnosis not present

## 2017-09-18 DIAGNOSIS — I10 Essential (primary) hypertension: Secondary | ICD-10-CM | POA: Diagnosis not present

## 2017-09-18 DIAGNOSIS — K219 Gastro-esophageal reflux disease without esophagitis: Secondary | ICD-10-CM | POA: Diagnosis not present

## 2017-09-20 DIAGNOSIS — G4733 Obstructive sleep apnea (adult) (pediatric): Secondary | ICD-10-CM | POA: Diagnosis not present

## 2017-09-20 DIAGNOSIS — J9611 Chronic respiratory failure with hypoxia: Secondary | ICD-10-CM | POA: Diagnosis not present

## 2017-09-20 DIAGNOSIS — J449 Chronic obstructive pulmonary disease, unspecified: Secondary | ICD-10-CM | POA: Diagnosis not present

## 2017-09-20 DIAGNOSIS — J441 Chronic obstructive pulmonary disease with (acute) exacerbation: Secondary | ICD-10-CM | POA: Diagnosis not present

## 2017-09-20 DIAGNOSIS — E119 Type 2 diabetes mellitus without complications: Secondary | ICD-10-CM | POA: Diagnosis not present

## 2017-09-23 ENCOUNTER — Other Ambulatory Visit: Payer: Self-pay | Admitting: Acute Care

## 2017-09-23 DIAGNOSIS — G4733 Obstructive sleep apnea (adult) (pediatric): Secondary | ICD-10-CM | POA: Diagnosis not present

## 2017-09-23 DIAGNOSIS — I10 Essential (primary) hypertension: Secondary | ICD-10-CM | POA: Diagnosis not present

## 2017-09-23 DIAGNOSIS — K219 Gastro-esophageal reflux disease without esophagitis: Secondary | ICD-10-CM | POA: Diagnosis not present

## 2017-09-23 DIAGNOSIS — R69 Illness, unspecified: Secondary | ICD-10-CM | POA: Diagnosis not present

## 2017-09-23 DIAGNOSIS — M170 Bilateral primary osteoarthritis of knee: Secondary | ICD-10-CM | POA: Diagnosis not present

## 2017-09-23 DIAGNOSIS — J439 Emphysema, unspecified: Secondary | ICD-10-CM | POA: Diagnosis not present

## 2017-09-23 DIAGNOSIS — E119 Type 2 diabetes mellitus without complications: Secondary | ICD-10-CM | POA: Diagnosis not present

## 2017-09-23 DIAGNOSIS — E039 Hypothyroidism, unspecified: Secondary | ICD-10-CM | POA: Diagnosis not present

## 2017-09-23 DIAGNOSIS — M1611 Unilateral primary osteoarthritis, right hip: Secondary | ICD-10-CM | POA: Diagnosis not present

## 2017-09-24 ENCOUNTER — Telehealth: Payer: Self-pay | Admitting: Internal Medicine

## 2017-09-24 NOTE — Telephone Encounter (Signed)
Patient may need to make an appointment since this looks like a new issue and since Dr. Sharlet Salina is not back till Tuesday.

## 2017-09-24 NOTE — Telephone Encounter (Signed)
Called patient and left message for her to call back to schedule an appointment with Sharlet Salina when she returns.

## 2017-09-24 NOTE — Telephone Encounter (Signed)
Copied from [place] [phone]. Topic: Referral - Request >> Sep 24, 2017  1:56 PM Yvette Rack wrote: Reason for CRM: pt calling wanting a referral for [facility] to come out to her home for PT for her walk due to left leg and OT for strength in upper body for her arms and lungs  please call pt at [phone]

## 2017-09-25 ENCOUNTER — Telehealth: Payer: Self-pay | Admitting: Internal Medicine

## 2017-09-25 ENCOUNTER — Ambulatory Visit: Payer: Medicare HMO | Admitting: Physician Assistant

## 2017-09-25 DIAGNOSIS — K219 Gastro-esophageal reflux disease without esophagitis: Secondary | ICD-10-CM | POA: Diagnosis not present

## 2017-09-25 DIAGNOSIS — M170 Bilateral primary osteoarthritis of knee: Secondary | ICD-10-CM | POA: Diagnosis not present

## 2017-09-25 DIAGNOSIS — I10 Essential (primary) hypertension: Secondary | ICD-10-CM | POA: Diagnosis not present

## 2017-09-25 DIAGNOSIS — J439 Emphysema, unspecified: Secondary | ICD-10-CM | POA: Diagnosis not present

## 2017-09-25 DIAGNOSIS — G4733 Obstructive sleep apnea (adult) (pediatric): Secondary | ICD-10-CM | POA: Diagnosis not present

## 2017-09-25 DIAGNOSIS — M1611 Unilateral primary osteoarthritis, right hip: Secondary | ICD-10-CM | POA: Diagnosis not present

## 2017-09-25 DIAGNOSIS — R69 Illness, unspecified: Secondary | ICD-10-CM | POA: Diagnosis not present

## 2017-09-25 DIAGNOSIS — E119 Type 2 diabetes mellitus without complications: Secondary | ICD-10-CM | POA: Diagnosis not present

## 2017-09-25 DIAGNOSIS — E039 Hypothyroidism, unspecified: Secondary | ICD-10-CM | POA: Diagnosis not present

## 2017-09-25 NOTE — Telephone Encounter (Signed)
Called [facility] back and informed them that I do not have any forms to sign for patient to continue to get [facility] health and they are looking into it and will fax the required forms

## 2017-09-25 NOTE — Telephone Encounter (Signed)
Copied from [place] [phone]. Topic: Quick Communication - See Telephone Encounter >> Sep 25, 2017 11:35 AM Ahmed Prima L wrote: CRM for notification. See Telephone encounter for: 09/25/17.  Solmon Ice, RN called from [facility] home health for new orders to continue home health. The current order she has on file will expire today. She said that the phone number is [phone] she does not know the fax number

## 2017-09-29 ENCOUNTER — Ambulatory Visit (HOSPITAL_COMMUNITY)
Admission: RE | Admit: 2017-09-29 | Discharge: 2017-09-29 | Disposition: A | Discharge status: Home / Self Care | Payer: Medicare HMO | Source: Ambulatory Visit | Attending: Cardiovascular Disease | Admitting: Cardiovascular Disease

## 2017-09-29 DIAGNOSIS — I6529 Occlusion and stenosis of unspecified carotid artery: Secondary | ICD-10-CM | POA: Diagnosis not present

## 2017-10-02 ENCOUNTER — Other Ambulatory Visit: Payer: Self-pay | Admitting: Internal Medicine

## 2017-10-06 ENCOUNTER — Ambulatory Visit: Payer: Medicare HMO | Admitting: Acute Care

## 2017-10-08 DIAGNOSIS — J441 Chronic obstructive pulmonary disease with (acute) exacerbation: Secondary | ICD-10-CM | POA: Diagnosis not present

## 2017-10-08 DIAGNOSIS — J449 Chronic obstructive pulmonary disease, unspecified: Secondary | ICD-10-CM | POA: Diagnosis not present

## 2017-10-08 DIAGNOSIS — J9611 Chronic respiratory failure with hypoxia: Secondary | ICD-10-CM | POA: Diagnosis not present

## 2017-10-11 DIAGNOSIS — R269 Unspecified abnormalities of gait and mobility: Secondary | ICD-10-CM | POA: Diagnosis not present

## 2017-10-11 DIAGNOSIS — M6281 Muscle weakness (generalized): Secondary | ICD-10-CM | POA: Diagnosis not present

## 2017-10-13 ENCOUNTER — Telehealth: Payer: Self-pay

## 2017-10-13 DIAGNOSIS — K219 Gastro-esophageal reflux disease without esophagitis: Secondary | ICD-10-CM

## 2017-10-13 NOTE — Telephone Encounter (Signed)
Copied from [place] [id number]. Topic: Referral - Request >> Oct 13, 2017  4:13 PM Bea Graff, NT wrote: Reason for CRM: Pts brother, Marvis Repress calling to request a referral for a GI for stomach pain. Pt is belching a lot and tells him she feels like something is in her stomach.

## 2017-10-14 DIAGNOSIS — K219 Gastro-esophageal reflux disease without esophagitis: Secondary | ICD-10-CM

## 2017-10-14 DIAGNOSIS — F411 Generalized anxiety disorder: Secondary | ICD-10-CM

## 2017-10-14 DIAGNOSIS — G4733 Obstructive sleep apnea (adult) (pediatric): Secondary | ICD-10-CM

## 2017-10-14 DIAGNOSIS — M1611 Unilateral primary osteoarthritis, right hip: Secondary | ICD-10-CM

## 2017-10-14 DIAGNOSIS — Z96642 Presence of left artificial hip joint: Secondary | ICD-10-CM

## 2017-10-14 DIAGNOSIS — R69 Illness, unspecified: Secondary | ICD-10-CM | POA: Diagnosis not present

## 2017-10-14 DIAGNOSIS — F330 Major depressive disorder, recurrent, mild: Secondary | ICD-10-CM

## 2017-10-14 DIAGNOSIS — Z87891 Personal history of nicotine dependence: Secondary | ICD-10-CM

## 2017-10-14 DIAGNOSIS — J439 Emphysema, unspecified: Secondary | ICD-10-CM

## 2017-10-14 DIAGNOSIS — E039 Hypothyroidism, unspecified: Secondary | ICD-10-CM

## 2017-10-14 DIAGNOSIS — M170 Bilateral primary osteoarthritis of knee: Secondary | ICD-10-CM

## 2017-10-14 DIAGNOSIS — E119 Type 2 diabetes mellitus without complications: Secondary | ICD-10-CM

## 2017-10-14 DIAGNOSIS — I10 Essential (primary) hypertension: Secondary | ICD-10-CM

## 2017-10-14 NOTE — Telephone Encounter (Signed)
Referral placed.

## 2017-10-14 NOTE — Telephone Encounter (Signed)
Patient informed. 

## 2017-10-14 NOTE — Addendum Note (Signed)
Addended by: Pricilla Holm A on: 10/14/2017 01:03 PM   Modules accepted: Orders

## 2017-10-17 ENCOUNTER — Encounter: Payer: Self-pay | Admitting: Internal Medicine

## 2017-10-17 MED ORDER — PREGABALIN 75 MG PO CAPS: 75.0000 mg | ORAL_CAPSULE | Freq: Two times a day (BID) | ORAL | 1 refills | Status: DC

## 2017-10-21 ENCOUNTER — Ambulatory Visit: Payer: Medicare HMO | Admitting: Cardiovascular Disease

## 2017-10-21 DIAGNOSIS — G4733 Obstructive sleep apnea (adult) (pediatric): Secondary | ICD-10-CM | POA: Diagnosis not present

## 2017-10-21 DIAGNOSIS — J9611 Chronic respiratory failure with hypoxia: Secondary | ICD-10-CM | POA: Diagnosis not present

## 2017-10-21 DIAGNOSIS — J449 Chronic obstructive pulmonary disease, unspecified: Secondary | ICD-10-CM | POA: Diagnosis not present

## 2017-10-21 DIAGNOSIS — J441 Chronic obstructive pulmonary disease with (acute) exacerbation: Secondary | ICD-10-CM | POA: Diagnosis not present

## 2017-10-22 ENCOUNTER — Ambulatory Visit: Payer: Medicare HMO | Admitting: Acute Care

## 2017-10-23 ENCOUNTER — Telehealth: Payer: Self-pay | Admitting: Internal Medicine

## 2017-10-23 NOTE — Telephone Encounter (Signed)
Verbals given  

## 2017-10-23 NOTE — Telephone Encounter (Unsigned)
Copied from [place] [phone]. Topic: Quick Communication - See Telephone Encounter >> Oct 23, 2017  2:39 PM Percell Belt A wrote: CRM for notification. See Telephone encounter for: 10/23/17.  Colletta [place]  for  [facility] home [phone] Need verbals for Skilled nurse to continue care  1 week 1 2 week 2 For wound care and heart care management

## 2017-10-23 NOTE — Telephone Encounter (Signed)
Fine

## 2017-10-24 ENCOUNTER — Encounter: Payer: Self-pay | Admitting: Internal Medicine

## 2017-10-28 MED ORDER — BACLOFEN 10 MG PO TABS: 10.0000 mg | ORAL_TABLET | Freq: Every day | ORAL | 3 refills | Status: DC

## 2017-11-03 ENCOUNTER — Telehealth: Payer: Self-pay

## 2017-11-03 NOTE — Telephone Encounter (Signed)
Copied from [place] [phone]. Topic: General - Other >> Nov 03, 2017  3:16 PM Carolyn Stare wrote: Pt call say [facility] need a new referral for them to keep coming. The one that she has is about to run out

## 2017-11-03 NOTE — Telephone Encounter (Signed)
What is [facility] providing or doing? What kind of order is she requesting?

## 2017-11-03 NOTE — Telephone Encounter (Signed)
States that a respiratory nurse would come out to check her lungs, oxygen, and blood pressure. Also was coming in after her surgery for hip and naval surgery and was doing PT and OT. States needing the referrals by Sept. 11th

## 2017-11-04 NOTE — Telephone Encounter (Signed)
Yes she still wants the PT/OT states she is going out of town to see her daughter and would like to make sure that everything is okay. States she feels like she still needs some more PT/OT

## 2017-11-04 NOTE — Telephone Encounter (Signed)
LVM for patient to call back and give [place] a number for [facility] phone number to call and give approval for PT/OT

## 2017-11-04 NOTE — Telephone Encounter (Signed)
Is she still wanting PT/OT? I am not sure that she needs the blood pressure and oxygen levels checked every week.

## 2017-11-04 NOTE — Telephone Encounter (Signed)
Ok to approve PT/OT.

## 2017-11-05 ENCOUNTER — Encounter: Payer: Self-pay | Admitting: Cardiovascular Disease

## 2017-11-05 ENCOUNTER — Ambulatory Visit: Payer: Medicare HMO | Admitting: Cardiovascular Disease

## 2017-11-05 DIAGNOSIS — I779 Disorder of arteries and arterioles, unspecified: Secondary | ICD-10-CM | POA: Insufficient documentation

## 2017-11-05 DIAGNOSIS — R0789 Other chest pain: Secondary | ICD-10-CM | POA: Diagnosis not present

## 2017-11-05 DIAGNOSIS — I739 Peripheral vascular disease, unspecified: Secondary | ICD-10-CM

## 2017-11-05 NOTE — Patient Instructions (Signed)

## 2017-11-05 NOTE — Assessment & Plan Note (Signed)
This Macfadden had carotid Doppler studies because she had a history of a 50% blockage in her carotid artery.  This was performed 09/29/2017 and was entirely normal.

## 2017-11-05 NOTE — Assessment & Plan Note (Signed)
Joan [name] returns today for follow-up of her Myoview stress test performed in the assessment of chest pressure this was done 08/19/2017 and was entirely normal.

## 2017-11-05 NOTE — Progress Notes (Signed)
Joan [name] returns today for follow-up of her outpatient diagnostic test.  Her Myoview was essentially normal.  Her monitor showed sinus rhythm without tachycardia.  Her carotid Dopplers were essentially normal as well.  She is scheduled to see a pulmonologist next week.  She did let me know that she quit smoking on 09/29/2017.  I will have her see an APP back in the office in 6 months and me back in 1 year.  Lorretta Harp, M.D., [place], [facility], Laverta [place] Streetman [address] [place], [place]  [address]  [phone] 11/05/2017 3:49 PM

## 2017-11-08 DIAGNOSIS — J9611 Chronic respiratory failure with hypoxia: Secondary | ICD-10-CM | POA: Diagnosis not present

## 2017-11-08 DIAGNOSIS — J441 Chronic obstructive pulmonary disease with (acute) exacerbation: Secondary | ICD-10-CM | POA: Diagnosis not present

## 2017-11-08 DIAGNOSIS — J449 Chronic obstructive pulmonary disease, unspecified: Secondary | ICD-10-CM | POA: Diagnosis not present

## 2017-11-11 ENCOUNTER — Encounter: Payer: Self-pay | Admitting: Pulmonary Disease

## 2017-11-11 ENCOUNTER — Ambulatory Visit: Payer: Medicare HMO | Admitting: Pulmonary Disease

## 2017-11-11 DIAGNOSIS — R911 Solitary pulmonary nodule: Secondary | ICD-10-CM

## 2017-11-11 DIAGNOSIS — M6281 Muscle weakness (generalized): Secondary | ICD-10-CM | POA: Diagnosis not present

## 2017-11-11 DIAGNOSIS — Z9989 Dependence on other enabling machines and devices: Secondary | ICD-10-CM | POA: Diagnosis not present

## 2017-11-11 DIAGNOSIS — R269 Unspecified abnormalities of gait and mobility: Secondary | ICD-10-CM | POA: Diagnosis not present

## 2017-11-11 DIAGNOSIS — J449 Chronic obstructive pulmonary disease, unspecified: Secondary | ICD-10-CM | POA: Diagnosis not present

## 2017-11-11 DIAGNOSIS — G4733 Obstructive sleep apnea (adult) (pediatric): Secondary | ICD-10-CM | POA: Diagnosis not present

## 2017-11-11 DIAGNOSIS — Z23 Encounter for immunization: Secondary | ICD-10-CM

## 2017-11-11 NOTE — Progress Notes (Signed)
   Subjective:    Patient ID: Joan [name], female    DOB: 10/30/1960, 57 y.o.   MRN: [id number]  HPI  57 year old ex-smoker follow-up of COPD and OSA She started smoking again but has finally quit again 09/2017.  She is compliant with her CPAP machine.  We provided her with a new machine after home sleep study 04/2017. Denies any problems with mask or pressure. She reports somnolence in spite of CPAP usage CPAP download was reviewed which shows excellent control of events on auto settings with average pressure of 10 cm and mild leak.  She has good usage more than 7 hours per night  She complains of sharp left-sided chest pain, has undergone nuclear stress test which was negative. Compliant with oxygen during sleep  We reviewed CT chest from 11/2016  Significant tests/ events reviewed  NPSG 02/2017 >> no OSA HST  04/2017 15/h     10/2016 PFTs showed ratio 49, FEV1 of 36%, FVC 58% and DLCO of 34% consistent with severe obstruction.  11/2016 CT chest tiny 12mm nodules  09/2016 echo normal.  ONO 11/2016 on CPAP/room air showed desaturation for 1 hour 45 minutes started on nocturnal oxygen>>  Review of Systems neg for any significant sore throat, dysphagia, itching, sneezing, nasal congestion or excess/ purulent secretions, fever, chills, sweats, unintended wt loss, pleuritic or exertional cp, hempoptysis, orthopnea pnd or change in chronic leg swelling. Also denies presyncope, palpitations, heartburn, abdominal pain, nausea, vomiting, diarrhea or change in bowel or urinary habits, dysuria,hematuria, rash, arthralgias, visual complaints, headache, numbness weakness or ataxia.     Objective:   Physical Exam  Gen. Pleasant, obese, in no distress ENT - no lesions, no post nasal drip Neck: No JVD, no thyromegaly, no carotid bruits Lungs: no use of accessory muscles, no dullness to percussion, decreased without rales or rhonchi  Cardiovascular: Rhythm regular, heart sounds  normal, no  murmurs or gallops, no peripheral edema Musculoskeletal: No deformities, no cyanosis or clubbing , no tremors       Assessment & Plan:

## 2017-11-11 NOTE — Patient Instructions (Signed)
flu shot today  CT chest without contrast in October to follow-up on tiny nodules  CPAP seems to be working well

## 2017-11-11 NOTE — Addendum Note (Signed)
Addended by: Valerie Salts on: 11/11/2017 04:32 PM   Modules accepted: Orders

## 2017-11-11 NOTE — Assessment & Plan Note (Signed)
Change to fixed pressure 10 cm She is compliant and CPAP is definitely helping.  Concerned that residual sleepiness may be related to hypercarbia  Weight loss encouraged, compliance with goal of at least 4-6 hrs every night is the expectation. Advised against medications with sedative side effects Cautioned against driving when sleepy - understanding that sleepiness will vary on a day to day basis

## 2017-11-11 NOTE — Assessment & Plan Note (Signed)
CT chest without contrast in October to follow-up on tiny nodules

## 2017-11-11 NOTE — Assessment & Plan Note (Signed)
Continue Symbicort use Combivent as needed. Does not need levo albuterol and can use regular albuterol

## 2017-11-21 DIAGNOSIS — J9611 Chronic respiratory failure with hypoxia: Secondary | ICD-10-CM | POA: Diagnosis not present

## 2017-11-21 DIAGNOSIS — G4733 Obstructive sleep apnea (adult) (pediatric): Secondary | ICD-10-CM | POA: Diagnosis not present

## 2017-11-21 DIAGNOSIS — J449 Chronic obstructive pulmonary disease, unspecified: Secondary | ICD-10-CM | POA: Diagnosis not present

## 2017-11-21 DIAGNOSIS — J441 Chronic obstructive pulmonary disease with (acute) exacerbation: Secondary | ICD-10-CM | POA: Diagnosis not present

## 2017-11-28 ENCOUNTER — Ambulatory Visit (INDEPENDENT_AMBULATORY_CARE_PROVIDER_SITE_OTHER)
Admission: RE | Admit: 2017-11-28 | Discharge: 2017-11-28 | Disposition: A | Discharge status: Home / Self Care | Payer: Medicare HMO | Source: Ambulatory Visit | Attending: Pulmonary Disease | Admitting: Pulmonary Disease

## 2017-11-28 ENCOUNTER — Encounter: Payer: Self-pay | Admitting: Gastroenterology

## 2017-11-28 DIAGNOSIS — R911 Solitary pulmonary nodule: Secondary | ICD-10-CM

## 2017-11-28 DIAGNOSIS — R918 Other nonspecific abnormal finding of lung field: Secondary | ICD-10-CM | POA: Diagnosis not present

## 2017-12-01 DIAGNOSIS — J343 Hypertrophy of nasal turbinates: Secondary | ICD-10-CM | POA: Diagnosis not present

## 2017-12-01 DIAGNOSIS — R69 Illness, unspecified: Secondary | ICD-10-CM | POA: Diagnosis not present

## 2017-12-01 DIAGNOSIS — J383 Other diseases of vocal cords: Secondary | ICD-10-CM | POA: Diagnosis not present

## 2017-12-08 DIAGNOSIS — J9611 Chronic respiratory failure with hypoxia: Secondary | ICD-10-CM | POA: Diagnosis not present

## 2017-12-08 DIAGNOSIS — J449 Chronic obstructive pulmonary disease, unspecified: Secondary | ICD-10-CM | POA: Diagnosis not present

## 2017-12-08 DIAGNOSIS — J441 Chronic obstructive pulmonary disease with (acute) exacerbation: Secondary | ICD-10-CM | POA: Diagnosis not present

## 2017-12-09 ENCOUNTER — Encounter: Payer: Self-pay | Admitting: Internal Medicine

## 2017-12-09 ENCOUNTER — Ambulatory Visit (INDEPENDENT_AMBULATORY_CARE_PROVIDER_SITE_OTHER): Payer: Medicare HMO | Admitting: Internal Medicine

## 2017-12-09 VITALS — BP 100/68 | HR 76 | Temp 98.2°F | Ht 68.0 in | Wt 217.0 lb

## 2017-12-09 DIAGNOSIS — G4700 Insomnia, unspecified: Secondary | ICD-10-CM

## 2017-12-09 DIAGNOSIS — J449 Chronic obstructive pulmonary disease, unspecified: Secondary | ICD-10-CM

## 2017-12-09 MED ORDER — HYDROCHLOROTHIAZIDE 25 MG PO TABS: 25.0000 mg | ORAL_TABLET | Freq: Every day | ORAL | 3 refills | Status: DC

## 2017-12-09 MED ORDER — METHOCARBAMOL 500 MG PO TABS: 500.0000 mg | ORAL_TABLET | Freq: Four times a day (QID) | ORAL | 0 refills | Status: DC | PRN

## 2017-12-09 MED ORDER — TRAZODONE HCL 100 MG PO TABS: 100.0000 mg | ORAL_TABLET | Freq: Every day | ORAL | 1 refills | Status: DC

## 2017-12-09 MED ORDER — METHYLPREDNISOLONE ACETATE 40 MG/ML IJ SUSP
40.0000 mg | Freq: Once | INTRAMUSCULAR | Status: AC
  Administered 2017-12-09: 40 mg via INTRAMUSCULAR

## 2017-12-09 MED ORDER — PREGABALIN 75 MG PO CAPS: 75.0000 mg | ORAL_CAPSULE | Freq: Two times a day (BID) | ORAL | 1 refills | Status: DC

## 2017-12-09 MED ORDER — BETAMETHASONE VALERATE 0.1 % EX OINT: 1.0000 "application " | TOPICAL_OINTMENT | Freq: Two times a day (BID) | CUTANEOUS | 0 refills | Status: DC

## 2017-12-09 NOTE — Patient Instructions (Signed)
We have given you a shot to help with the breathing.   Call the ENT doctor about the hearing.  For sleeping we have sent in a medicine called trazodone to help you sleep.

## 2017-12-09 NOTE — Progress Notes (Signed)
   Subjective:    Patient ID: Joan [name], female    DOB: [birth year], 57 y.o.   MRN: [id number]  HPI The patient is a 57 YO female coming in for more problems with breathing lately. She is doing about 3-6 nebulizer treatments daily to help. She denies allergy drainage lately. Denies ear pain or pressure. Coughing some more although sputum stable. She is taking medications from pulmonary and they have made adjustments recently. She is having some chest pressure with this. Has had testing recently on her heart without evidence of problems including stress test June 2019, echo about 1 year ago.   Review of Systems  Constitutional: Positive for activity change. Negative for appetite change, chills, fatigue, fever and unexpected weight change.  HENT: Negative.   Respiratory: Positive for cough and shortness of breath. Negative for chest tightness.   Cardiovascular: Negative for chest pain, palpitations and leg swelling.  Gastrointestinal: Negative for abdominal distention, abdominal pain, constipation, diarrhea, nausea and vomiting.  Musculoskeletal: Negative.   Skin: Negative.   Neurological: Negative.       Objective:   Physical Exam  Constitutional: She is oriented to person, place, and time. She appears well-developed and well-nourished.  HENT:  Head: Normocephalic and atraumatic.  Eyes: EOM are normal.  Neck: Normal range of motion.  Cardiovascular: Normal rate and regular rhythm.  Pulmonary/Chest: Effort normal. No respiratory distress. She has no wheezes. She has no rales.  Lung exam stable but coarse rhonchi throughout  Abdominal: Soft. She exhibits no distension. There is no tenderness. There is no rebound.  Musculoskeletal: She exhibits no edema.  Neurological: She is alert and oriented to person, place, and time. Coordination normal.  Skin: Skin is warm and dry.   Vitals:   12/09/17 1011  BP: 100/68  Pulse: 76  Temp: 98.2 F (36.8 C)  TempSrc: Oral  SpO2: 94%  Weight:  217 lb (98.4 kg)  Height: 5\' 8"  (1.727 m)      Assessment & Plan:  Depo-medrol 40 mg IM

## 2017-12-10 ENCOUNTER — Other Ambulatory Visit: Payer: Self-pay | Admitting: Internal Medicine

## 2017-12-10 DIAGNOSIS — R911 Solitary pulmonary nodule: Secondary | ICD-10-CM

## 2017-12-11 ENCOUNTER — Encounter: Payer: Self-pay | Admitting: Internal Medicine

## 2017-12-11 DIAGNOSIS — M6281 Muscle weakness (generalized): Secondary | ICD-10-CM | POA: Diagnosis not present

## 2017-12-11 DIAGNOSIS — G4700 Insomnia, unspecified: Secondary | ICD-10-CM | POA: Insufficient documentation

## 2017-12-11 DIAGNOSIS — R269 Unspecified abnormalities of gait and mobility: Secondary | ICD-10-CM | POA: Diagnosis not present

## 2017-12-11 NOTE — Assessment & Plan Note (Signed)
Using CPAP nightly. Rx for trazodone to help sleep. Given hx abuse would not prescribe controlled for sleep.

## 2017-12-11 NOTE — Assessment & Plan Note (Signed)
Given depo-medrol 40 mg IM today for possible COPD flare with worsening SOB. She has had change to her regimen due to insurance changes which has caused her more SOB. She needs to change back to spiriva and symbicort.

## 2017-12-15 ENCOUNTER — Encounter: Payer: Self-pay | Admitting: Internal Medicine

## 2017-12-17 ENCOUNTER — Telehealth: Payer: Self-pay | Admitting: Pulmonary Disease

## 2017-12-17 NOTE — Telephone Encounter (Signed)
OK for incruse bid

## 2017-12-17 NOTE — Telephone Encounter (Signed)
Called and spoke with patients insurance company. Spiriva is not on the patients formulary and it will not be covered. Covered alternative is Incruse.   RA please advise, thank you.

## 2017-12-18 ENCOUNTER — Telehealth (HOSPITAL_COMMUNITY): Payer: Self-pay | Admitting: Licensed Clinical Social Worker

## 2017-12-18 NOTE — Telephone Encounter (Signed)
Pt is calling back [phone]

## 2017-12-18 NOTE — Telephone Encounter (Signed)
Called and spoke with pt who stated she called [facility] stating that she has tried some inhalers that are the covered alternatives and stated she told them she has tried others as well.  Pt stated the spiriva that is not covered by insurance really works well for her. Pt stated that [facility] is going to be contacting the office in regards to this to see if we can be able to get pt approved for the spiriva due to her trying and failing inhalers that are the covered alternatives.  Will await a call from pt's insurance.

## 2017-12-18 NOTE — Telephone Encounter (Signed)
Sorry, once daily

## 2017-12-18 NOTE — Telephone Encounter (Signed)
Spoke with the pt  She states that she can not tolerate the Incruse, it causes nausea  She states she is currently taking combivent and it's not working  I advised that she needs to obtain her drug formulary  She will call for this and call us back

## 2017-12-18 NOTE — Telephone Encounter (Signed)
Just to be sure- you want this bid? It is typically taken once daily  Just want to clarify before I send, thanks

## 2017-12-19 ENCOUNTER — Telehealth: Payer: Self-pay | Admitting: Pulmonary Disease

## 2017-12-19 NOTE — Telephone Encounter (Signed)
Received a fax from [facility] stating that the patient's Spiriva has been approved from 02/23/17 to 02/25/19. Pharmacy is aware. Nothing further needed at time of call.

## 2017-12-19 NOTE — Telephone Encounter (Signed)
PA request received from [facility] for Spiriva Respimat 2.5 CMM Key: [id number] PA request has been sent to plan, and a determination is expected within 5 days.   Routing to [place] for follow-up.

## 2017-12-20 DIAGNOSIS — E119 Type 2 diabetes mellitus without complications: Secondary | ICD-10-CM | POA: Diagnosis not present

## 2017-12-21 DIAGNOSIS — G4733 Obstructive sleep apnea (adult) (pediatric): Secondary | ICD-10-CM | POA: Diagnosis not present

## 2017-12-21 DIAGNOSIS — J449 Chronic obstructive pulmonary disease, unspecified: Secondary | ICD-10-CM | POA: Diagnosis not present

## 2017-12-21 DIAGNOSIS — J9611 Chronic respiratory failure with hypoxia: Secondary | ICD-10-CM | POA: Diagnosis not present

## 2017-12-21 DIAGNOSIS — J441 Chronic obstructive pulmonary disease with (acute) exacerbation: Secondary | ICD-10-CM | POA: Diagnosis not present

## 2017-12-23 DIAGNOSIS — H93293 Other abnormal auditory perceptions, bilateral: Secondary | ICD-10-CM | POA: Diagnosis not present

## 2017-12-23 DIAGNOSIS — H9042 Sensorineural hearing loss, unilateral, left ear, with unrestricted hearing on the contralateral side: Secondary | ICD-10-CM | POA: Diagnosis not present

## 2017-12-23 NOTE — Telephone Encounter (Signed)
PA for pt's spiriva has been started. Will close this encounter as it is a duplicate encounter with another phone message in regards to the PA.

## 2017-12-28 ENCOUNTER — Inpatient Hospital Stay (HOSPITAL_COMMUNITY)
Admission: EM | Admit: 2017-12-28 | Discharge: 2017-12-30 | DRG: 190 | Disposition: A | Discharge status: Home Health | Payer: Medicare HMO | Attending: Internal Medicine | Admitting: Internal Medicine

## 2017-12-28 ENCOUNTER — Emergency Department (HOSPITAL_COMMUNITY): Payer: Medicare HMO

## 2017-12-28 ENCOUNTER — Other Ambulatory Visit: Payer: Self-pay

## 2017-12-28 DIAGNOSIS — J9601 Acute respiratory failure with hypoxia: Secondary | ICD-10-CM | POA: Diagnosis present

## 2017-12-28 DIAGNOSIS — E785 Hyperlipidemia, unspecified: Secondary | ICD-10-CM | POA: Diagnosis present

## 2017-12-28 DIAGNOSIS — G8929 Other chronic pain: Secondary | ICD-10-CM | POA: Diagnosis present

## 2017-12-28 DIAGNOSIS — J441 Chronic obstructive pulmonary disease with (acute) exacerbation: Principal | ICD-10-CM

## 2017-12-28 DIAGNOSIS — Z9981 Dependence on supplemental oxygen: Secondary | ICD-10-CM

## 2017-12-28 DIAGNOSIS — Z981 Arthrodesis status: Secondary | ICD-10-CM

## 2017-12-28 DIAGNOSIS — E119 Type 2 diabetes mellitus without complications: Secondary | ICD-10-CM | POA: Diagnosis present

## 2017-12-28 DIAGNOSIS — Z9989 Dependence on other enabling machines and devices: Secondary | ICD-10-CM

## 2017-12-28 DIAGNOSIS — T380X5A Adverse effect of glucocorticoids and synthetic analogues, initial encounter: Secondary | ICD-10-CM | POA: Diagnosis present

## 2017-12-28 DIAGNOSIS — Z7951 Long term (current) use of inhaled steroids: Secondary | ICD-10-CM

## 2017-12-28 DIAGNOSIS — I10 Essential (primary) hypertension: Secondary | ICD-10-CM | POA: Diagnosis not present

## 2017-12-28 DIAGNOSIS — E039 Hypothyroidism, unspecified: Secondary | ICD-10-CM | POA: Diagnosis present

## 2017-12-28 DIAGNOSIS — F17210 Nicotine dependence, cigarettes, uncomplicated: Secondary | ICD-10-CM | POA: Diagnosis present

## 2017-12-28 DIAGNOSIS — Z96643 Presence of artificial hip joint, bilateral: Secondary | ICD-10-CM | POA: Diagnosis present

## 2017-12-28 DIAGNOSIS — R0602 Shortness of breath: Secondary | ICD-10-CM | POA: Diagnosis not present

## 2017-12-28 DIAGNOSIS — F329 Major depressive disorder, single episode, unspecified: Secondary | ICD-10-CM | POA: Diagnosis present

## 2017-12-28 DIAGNOSIS — E876 Hypokalemia: Secondary | ICD-10-CM | POA: Diagnosis present

## 2017-12-28 DIAGNOSIS — Z79899 Other long term (current) drug therapy: Secondary | ICD-10-CM

## 2017-12-28 DIAGNOSIS — R7303 Prediabetes: Secondary | ICD-10-CM | POA: Diagnosis present

## 2017-12-28 DIAGNOSIS — Z888 Allergy status to other drugs, medicaments and biological substances status: Secondary | ICD-10-CM

## 2017-12-28 DIAGNOSIS — J9621 Acute and chronic respiratory failure with hypoxia: Secondary | ICD-10-CM | POA: Diagnosis present

## 2017-12-28 DIAGNOSIS — I4510 Unspecified right bundle-branch block: Secondary | ICD-10-CM | POA: Diagnosis not present

## 2017-12-28 DIAGNOSIS — G4733 Obstructive sleep apnea (adult) (pediatric): Secondary | ICD-10-CM | POA: Diagnosis present

## 2017-12-28 DIAGNOSIS — N179 Acute kidney failure, unspecified: Secondary | ICD-10-CM | POA: Diagnosis present

## 2017-12-28 DIAGNOSIS — Z91013 Allergy to seafood: Secondary | ICD-10-CM

## 2017-12-28 DIAGNOSIS — E034 Atrophy of thyroid (acquired): Secondary | ICD-10-CM | POA: Diagnosis present

## 2017-12-28 DIAGNOSIS — Z881 Allergy status to other antibiotic agents status: Secondary | ICD-10-CM

## 2017-12-28 DIAGNOSIS — D649 Anemia, unspecified: Secondary | ICD-10-CM | POA: Diagnosis present

## 2017-12-28 DIAGNOSIS — K219 Gastro-esophageal reflux disease without esophagitis: Secondary | ICD-10-CM | POA: Diagnosis present

## 2017-12-28 HISTORY — DX: Acute respiratory failure with hypoxia: J96.01

## 2017-12-28 LAB — BASIC METABOLIC PANEL
Anion gap: 10 (ref 5–15)
BUN: 18 mg/dL (ref 6–20)
CO2: 26 mmol/L (ref 22–32)
Calcium: 8.8 mg/dL — ABNORMAL LOW (ref 8.9–10.3)
Chloride: 105 mmol/L (ref 98–111)
Creatinine, Ser: 1.36 mg/dL — ABNORMAL HIGH (ref 0.44–1.00)
GFR calc Af Amer: 49 mL/min — ABNORMAL LOW (ref >60)
GFR calc non Af Amer: 43 mL/min — ABNORMAL LOW (ref >60)
Glucose, Bld: 143 mg/dL — ABNORMAL HIGH (ref 70–99)
Potassium: 3.6 mmol/L (ref 3.5–5.1)
Sodium: 141 mmol/L (ref 135–145)

## 2017-12-28 LAB — CBC
HCT: 37.9 % (ref 36.0–46.0)
Hemoglobin: 11.7 g/dL — ABNORMAL LOW (ref 12.0–15.0)
MCH: 29.5 pg (ref 26.0–34.0)
MCHC: 30.9 g/dL (ref 30.0–36.0)
MCV: 95.5 fL (ref 80.0–100.0)
Platelets: 361 10*3/uL (ref 150–400)
RBC: 3.97 MIL/uL (ref 3.87–5.11)
RDW: 15 % (ref 11.5–15.5)
WBC: 19.2 10*3/uL — ABNORMAL HIGH (ref 4.0–10.5)
nRBC: 0 % (ref 0.0–0.2)

## 2017-12-28 LAB — I-STAT TROPONIN, ED: Troponin i, poc: 0 ng/mL (ref 0.00–0.08)

## 2017-12-28 MED ORDER — ALBUTEROL (5 MG/ML) CONTINUOUS INHALATION SOLN
10.0000 mg/h | INHALATION_SOLUTION | RESPIRATORY_TRACT | Status: DC
  Administered 2017-12-28: 10 mg/h via RESPIRATORY_TRACT
  Filled 2017-12-28: qty 20

## 2017-12-28 MED ORDER — METHYLPREDNISOLONE SODIUM SUCC 125 MG IJ SOLR
125.0000 mg | INTRAMUSCULAR | Status: AC
  Administered 2017-12-28: 125 mg via INTRAVENOUS
  Filled 2017-12-28: qty 2

## 2017-12-28 MED ORDER — ALBUTEROL SULFATE (2.5 MG/3ML) 0.083% IN NEBU: 5.0000 mg | INHALATION_SOLUTION | Freq: Once | RESPIRATORY_TRACT | Status: DC

## 2017-12-28 NOTE — ED Triage Notes (Addendum)
Patient c/o SOB for over one week. Has been doing 3-4 tx's (nebs) a day. Hx of COPD and uses O2 at nights. Pt placed on O2 in triage.

## 2017-12-28 NOTE — ED Provider Notes (Signed)
[facility] EMERGENCY DEPARTMENT Provider Note   CSN: [id number] Arrival date & time: 12/28/17  1946     History   Chief Complaint Chief Complaint  Patient presents with  . Shortness of Breath    HPI Joan [name] is a 57 y.o. female.  HPI Patient presents to the emergency department with increasing wheezing and shortness of breath over the last week.  The patient states that she is been doing 3-4 nebulized treatments a day.  She has history of COPD and uses CPAP at night.  The patient states that nothing seems make the condition better but exertion makes her symptoms worse.  The patient states that she has been taking her medications as prescribed at home.  Patient states that she has not had any fevers.  The patient denies chest pain,headache,blurred vision, neck pain, fever, cough, weakness, numbness, dizziness, anorexia, edema, abdominal pain, nausea, vomiting, diarrhea, rash, back pain, dysuria, hematemesis, bloody stool, near syncope, or syncope. Past Medical History:  Diagnosis Date  . Alcoholism and drug addiction in family   . Anxiety   . Arthritis    "knees, hips, lower back" (09/25/2016)  . Asthma   . Bipolar disorder ([place])   . Chronic bronchitis ([place])    "all the time" (09/25/2016)  . Chronic leg pain    RLE  . Chronic lower back pain   . COPD (chronic obstructive pulmonary disease) ([place])   . Depression   . Emphysema of lung ([place])   . GERD (gastroesophageal reflux disease)   . Headache   . Hepatitis C    "treated back in 2001-2002"  . High cholesterol   . Hypertension   . Hypothyroidism   . Incarcerated ventral hernia 04/04/2017  . On home oxygen therapy    "2L; at nighttime" (04/04/2017)  . OSA on CPAP   . Pneumonia    "all the time" (09/25/2016)  . Positive PPD    with negative chest x ray  . Tachycardia    patient reports with exertion ; denies any other conditions   . Thyroid disease     Patient Active Problem List   Diagnosis Date  Noted  . Insomnia 12/11/2017  . Pulmonary nodule 11/11/2017  . Carotid artery disease ([place]) 11/05/2017  . Hoarseness 09/09/2017  . COPD with chronic bronchitis and emphysema ([place]) 08/07/2017  . OSA on CPAP 08/07/2017  . Chest pressure 07/18/2017  . Family history of heart disease 07/18/2017  . GERD without esophagitis 06/23/2017  . H/O total hip arthroplasty, left 06/18/2017  . Prediabetes 04/05/2017  . Hypothyroidism due to acquired atrophy of thyroid 04/05/2017  . Primary osteoarthritis involving multiple joints 04/05/2017  . Incarcerated ventral hernia 04/04/2017  . Chronic constipation 02/10/2017  . Iron deficiency anemia due to dietary causes 02/10/2017  . Osteoarthritis 02/10/2017  . Dyslipidemia 02/10/2017  . H/O total hip arthroplasty, right 02/05/2017  . Pre-operative clearance 12/16/2016  . Chronic respiratory failure with hypoxia ([place]) 12/16/2016  . Essential hypertension 11/29/2016  . Back pain 11/29/2016  . Vocal cord dysfunction   . COPD, severe ([place]) 09/24/2016  . Anxiety 09/24/2016  . Steroid-induced diabetes ([place]) 09/24/2016    Past Surgical History:  Procedure Laterality Date  . BACK SURGERY    . CARDIAC CATHETERIZATION    . COLONOSCOPY    . CYST EXCISION     removal of cyst in sinuses  . DILATION AND CURETTAGE OF UTERUS    . FOOT SURGERY Bilateral    bone removed  from 4th and 5 th toes and put in pin then took pin out  . HERNIA REPAIR    . INCISION AND DRAINAGE  ~ 2014/2015   "removed mesh & infection"  . INCISIONAL HERNIA REPAIR N/A 04/04/2017   Procedure: LAPAROSCOPIC INCISIONAL HERNIA REPAIR WITH MESH;  Surgeon: Fanny Skates, MD;  Location: [facility];  Service: General;  Laterality: N/A;  . LACERATION REPAIR Right    repair wrist artery from laceration from ice skate  . LAPAROSCOPIC INCISIONAL / UMBILICAL / VENTRAL HERNIA REPAIR  04/04/2017   LAPAROSCOPIC INCISIONAL HERNIA REPAIR WITH MESH  . LIPOMA EXCISION Right    fattty lipoma in neck  . NASAL  SEPTUM SURGERY    . POSTERIOR LUMBAR FUSION  2015/2016 X 2   "added rods and screws"  . TOTAL HIP ARTHROPLASTY Right 02/05/2017   Procedure: RIGHT TOTAL HIP ARTHROPLASTY;  Surgeon: [name], MD;  Location: [facility];  Service: Orthopedics;  Laterality: Right;  . TOTAL HIP ARTHROPLASTY Left 06/18/2017   Procedure: LEFT TOTAL HIP ARTHROPLASTY;  Surgeon: [name], MD;  Location: [facility];  Service: Orthopedics;  Laterality: Left;  . TUBAL LIGATION    . UMBILICAL HERNIA REPAIR  ~ 2013   w/mesh  . UPPER GI ENDOSCOPY       OB History   None      Home Medications    Prior to Admission medications   Medication Sig Start Date End Date Taking? Authorizing Provider  albuterol (PROAIR HFA) 108 (90 Base) MCG/ACT inhaler Inhale 1-2 puffs into the lungs every 6 (six) hours as needed for wheezing or shortness of breath. 09/08/17  Yes [name], MD  albuterol (PROVENTIL) (2.5 MG/3ML) 0.083% nebulizer solution Take 3 mLs (2.5 mg total) by nebulization 2 (two) times daily. And every 6 hours as needed 08/07/17  Yes Tanda Rockers, MD  atenolol (TENORMIN) 25 MG tablet Take 1 tablet (25 mg total) by mouth daily. 09/08/17  Yes [name], MD  atorvastatin (LIPITOR) 20 MG tablet Take 1 tablet (20 mg total) by mouth daily. 09/08/17  Yes [name], MD  baclofen (LIORESAL) 10 MG tablet Take 1 tablet (10 mg total) by mouth at bedtime. 10/28/17  Yes [name], MD  betamethasone valerate ointment (VALISONE) 0.1 % Apply 1 application topically 2 (two) times daily. 12/09/17  Yes [name], MD  diphenhydrAMINE (BENADRYL) 25 MG tablet Take 50 mg by mouth daily.   Yes [provider]  hydrochlorothiazide (HYDRODIURIL) 25 MG tablet Take 1 tablet (25 mg total) by mouth daily. 12/09/17  Yes [name], MD  Ipratropium-Albuterol (COMBIVENT RESPIMAT) 20-100 MCG/ACT AERS respimat Inhale 1 puff into the lungs every 6 (six) hours as needed for wheezing.  09/17/17  Yes [name], MD  levothyroxine (SYNTHROID, LEVOTHROID) 25 MCG tablet Take 1 tablet (25 mcg total) by mouth daily before breakfast. 09/08/17  Yes [name], MD  methocarbamol (ROBAXIN) 500 MG tablet Take 500 mg by mouth at bedtime.   Yes [provider]  montelukast (SINGULAIR) 10 MG tablet Take 1 tablet (10 mg total) by mouth at bedtime. 09/08/17  Yes [name], MD  naproxen (NAPROSYN) 500 MG tablet Take 1,000 mg by mouth 2 (two) times daily as needed for mild pain.  06/21/17  Yes [provider]  nystatin-triamcinolone ointment (MYCOLOG) Apply 1 application topically 2 (two) times daily as needed (applied to skin folds/groins/under breast skin irritation rash.). 09/08/17  Yes [name],  MD  pantoprazole (PROTONIX) 40 MG tablet Take 1 tablet (40 mg total) by mouth daily. Patient taking differently: Take 40 mg by mouth 2 (two) times daily.  09/08/17  Yes [name], MD  pregabalin (LYRICA) 75 MG capsule Take 1 capsule (75 mg total) by mouth 2 (two) times daily. 12/09/17  Yes [name], MD  roflumilast (DALIRESP) 500 MCG TABS tablet Take 1 tablet (500 mcg total) by mouth daily. 09/08/17  Yes [name], MD  SPIRIVA RESPIMAT 2.5 MCG/ACT AERS Inhale 2 puffs into the lungs every morning. 12/19/17  Yes [provider]  SYMBICORT 160-4.5 MCG/ACT inhaler Inhale 2 puffs into the lungs 2 (two) times daily. 09/08/17  Yes [name], MD  traZODone (DESYREL) 100 MG tablet Take 1 tablet (100 mg total) by mouth at bedtime. 12/09/17  Yes [name], MD  valACYclovir (VALTREX) 500 MG tablet Take 1 tablet (500 mg total) by mouth daily. 09/08/17  Yes [name], MD  venlafaxine XR (EFFEXOR-XR) 150 MG 24 hr capsule TAKE 1 CAPSULE DAILY WITH  BREAKFAST Patient taking differently: Take 150 mg by mouth daily with breakfast.  09/08/17  Yes [name], MD  Melatonin 5 MG CAPS Take 1 capsule (5 mg  total) by mouth at bedtime. Patient not taking: Reported on 12/28/2017 06/09/17   [name], MD    Family History Family History  Problem Relation Age of Onset  . Diabetes Mother   . Hypertension Mother   . Hypertension Father   . Cancer Father        lung    Social History Social History   Tobacco Use  . Smoking status: Former Smoker    Packs/day: 1.00    Years: 44.00    Pack years: 44.00    Types: Cigarettes    Last attempt to quit: 09/29/2017    Years since quitting: 0.2  . Smokeless tobacco: Never Used  Substance Use Topics  . Alcohol use: Yes  . Drug use: Yes    Types: "Crack" cocaine    Comment: 04/04/2017 "nothing since 01/12/2012"     Allergies   Keflex [cephalexin]; Prednisone; Shellfish allergy; Tetracyclines & related; Dilaudid [hydromorphone hcl]; Incruse ellipta [umeclidinium bromide]; and Tuberculin tests   Review of Systems Review of Systems All other systems negative except as documented in the HPI. All pertinent positives and negatives as reviewed in the HPI.  Physical Exam Updated Vital Signs BP 100/61   Pulse 76   Temp 99 F (37.2 C) (Oral)   Resp 16   Wt 97.5 kg   SpO2 100%   BMI 32.69 kg/m   Physical Exam  Constitutional: She is oriented to person, place, and time. She appears well-developed and well-nourished. No distress.  HENT:  Head: Normocephalic and atraumatic.  Mouth/Throat: Oropharynx is clear and moist.  Eyes: Pupils are equal, round, and reactive to light.  Neck: Normal range of motion. Neck supple.  Cardiovascular: Normal rate, regular rhythm and normal heart sounds. Exam reveals no gallop and no friction rub.  No murmur heard. Pulmonary/Chest: Effort normal. No respiratory distress. She has decreased breath sounds. She has wheezes. She has no rhonchi. She has no rales.  Abdominal: Soft. Bowel sounds are normal. She exhibits no distension. There is no tenderness.  Neurological: She is alert and oriented to person,  place, and time. She exhibits normal muscle tone. Coordination normal.  Skin: Skin is warm and dry. Capillary refill takes less than 2 seconds. No  rash noted. No erythema.  Psychiatric: She has a normal mood and affect. Her behavior is normal.  Nursing note and vitals reviewed.    ED Treatments / Results  Labs (all labs ordered are listed, but only abnormal results are displayed) Labs Reviewed  BASIC METABOLIC PANEL - Abnormal; Notable for the following components:      Result Value   Glucose, Bld 143 (*)    Creatinine, Ser 1.36 (*)    Calcium 8.8 (*)    GFR calc non Af Amer 43 (*)    GFR calc Af Amer 49 (*)    All other components within normal limits  CBC - Abnormal; Notable for the following components:   WBC 19.2 (*)    Hemoglobin 11.7 (*)    All other components within normal limits  I-STAT TROPONIN, ED    EKG None  Radiology Dg Chest 2 View  Result Date: 12/28/2017 CLINICAL DATA:  Shortness of breath EXAM: CHEST - 2 VIEW COMPARISON:  11/28/2017 FINDINGS: Cardiac shadow is within normal limits. The lungs are hyperinflated similar to that seen on prior exam. No acute infiltrate or sizable effusion is seen bony abnormality is noted IMPRESSION: COPD without acute abnormality. Electronically Signed   By: Inez Catalina M.D.   On: 12/28/2017 20:41    Procedures Procedures (including critical care time)  Medications Ordered in ED Medications  albuterol (PROVENTIL,VENTOLIN) solution continuous neb (10 mg/hr Nebulization New Bag/Given 12/28/17 2316)  methylPREDNISolone sodium succinate (SOLU-MEDROL) 125 mg/2 mL injection 125 mg (has no administration in time range)     Initial Impression / Assessment and Plan / ED Course  I have reviewed the triage vital signs and the nursing notes.  Pertinent labs & imaging results that were available during my care of the patient were reviewed by me and considered in my medical decision making (see chart for details).    CRITICAL  CARE Performed by: Resa Miner Gem Conkle Total critical care time: 30 minutes Critical care time was exclusive of separately billable procedures and treating other patients. Critical care was necessary to treat or prevent imminent or life-threatening deterioration. Critical care was time spent personally by me on the following activities: development of treatment plan with patient and/or surrogate as well as nursing, discussions with consultants, evaluation of patient's response to treatment, examination of patient, obtaining history from patient or surrogate, ordering and performing treatments and interventions, ordering and review of laboratory studies, ordering and review of radiographic studies, pulse oximetry and re-evaluation of patient's condition. She has had multiple neb treatments along with an hour-long neb treatment here in the emergency department.  She will still need admission due to the fact that she has not improved dramatically.  Patient has difficulty ambulating due to shortness of breath.  The patient does drop her oxygen saturations while ambulating his well.  The patient is very tight and wheezing throughout all lung fields.  With decreased breath sounds bilaterally.  I do feel that the patient is having a COPD exacerbation will need to be admitted to the hospital.  Final Clinical Impressions(s) / ED Diagnoses   Final diagnoses:  None    ED Discharge Orders    None       Rebeca Allegra 12/28/17 2325    Virgel Manifold, MD 12/28/17 2357

## 2017-12-28 NOTE — ED Notes (Signed)
IV attempt unsuccessful; Second RN to try.

## 2017-12-29 ENCOUNTER — Encounter (HOSPITAL_COMMUNITY): Payer: Self-pay | Admitting: Emergency Medicine

## 2017-12-29 DIAGNOSIS — G8929 Other chronic pain: Secondary | ICD-10-CM | POA: Diagnosis present

## 2017-12-29 DIAGNOSIS — Z888 Allergy status to other drugs, medicaments and biological substances status: Secondary | ICD-10-CM | POA: Diagnosis not present

## 2017-12-29 DIAGNOSIS — J441 Chronic obstructive pulmonary disease with (acute) exacerbation: Secondary | ICD-10-CM | POA: Diagnosis not present

## 2017-12-29 DIAGNOSIS — I10 Essential (primary) hypertension: Secondary | ICD-10-CM | POA: Diagnosis not present

## 2017-12-29 DIAGNOSIS — G4733 Obstructive sleep apnea (adult) (pediatric): Secondary | ICD-10-CM

## 2017-12-29 DIAGNOSIS — Z96643 Presence of artificial hip joint, bilateral: Secondary | ICD-10-CM | POA: Diagnosis present

## 2017-12-29 DIAGNOSIS — T380X5A Adverse effect of glucocorticoids and synthetic analogues, initial encounter: Secondary | ICD-10-CM | POA: Diagnosis not present

## 2017-12-29 DIAGNOSIS — J9621 Acute and chronic respiratory failure with hypoxia: Secondary | ICD-10-CM | POA: Diagnosis not present

## 2017-12-29 DIAGNOSIS — Z7951 Long term (current) use of inhaled steroids: Secondary | ICD-10-CM | POA: Diagnosis not present

## 2017-12-29 DIAGNOSIS — E876 Hypokalemia: Secondary | ICD-10-CM | POA: Diagnosis present

## 2017-12-29 DIAGNOSIS — N179 Acute kidney failure, unspecified: Secondary | ICD-10-CM | POA: Diagnosis not present

## 2017-12-29 DIAGNOSIS — E039 Hypothyroidism, unspecified: Secondary | ICD-10-CM | POA: Diagnosis not present

## 2017-12-29 DIAGNOSIS — E119 Type 2 diabetes mellitus without complications: Secondary | ICD-10-CM | POA: Diagnosis present

## 2017-12-29 DIAGNOSIS — F17210 Nicotine dependence, cigarettes, uncomplicated: Secondary | ICD-10-CM | POA: Diagnosis present

## 2017-12-29 DIAGNOSIS — D649 Anemia, unspecified: Secondary | ICD-10-CM | POA: Diagnosis not present

## 2017-12-29 DIAGNOSIS — Z9981 Dependence on supplemental oxygen: Secondary | ICD-10-CM | POA: Diagnosis not present

## 2017-12-29 DIAGNOSIS — Z91013 Allergy to seafood: Secondary | ICD-10-CM | POA: Diagnosis not present

## 2017-12-29 DIAGNOSIS — K219 Gastro-esophageal reflux disease without esophagitis: Secondary | ICD-10-CM | POA: Diagnosis present

## 2017-12-29 DIAGNOSIS — Z9989 Dependence on other enabling machines and devices: Secondary | ICD-10-CM

## 2017-12-29 DIAGNOSIS — F329 Major depressive disorder, single episode, unspecified: Secondary | ICD-10-CM | POA: Diagnosis present

## 2017-12-29 DIAGNOSIS — J9601 Acute respiratory failure with hypoxia: Secondary | ICD-10-CM | POA: Diagnosis not present

## 2017-12-29 DIAGNOSIS — Z881 Allergy status to other antibiotic agents status: Secondary | ICD-10-CM | POA: Diagnosis not present

## 2017-12-29 DIAGNOSIS — Z79899 Other long term (current) drug therapy: Secondary | ICD-10-CM | POA: Diagnosis not present

## 2017-12-29 DIAGNOSIS — Z981 Arthrodesis status: Secondary | ICD-10-CM | POA: Diagnosis not present

## 2017-12-29 DIAGNOSIS — E785 Hyperlipidemia, unspecified: Secondary | ICD-10-CM | POA: Diagnosis not present

## 2017-12-29 LAB — BASIC METABOLIC PANEL
Anion gap: 10 (ref 5–15)
BUN: 20 mg/dL (ref 6–20)
CO2: 24 mmol/L (ref 22–32)
Calcium: 8.3 mg/dL — ABNORMAL LOW (ref 8.9–10.3)
Chloride: 106 mmol/L (ref 98–111)
Creatinine, Ser: 1.31 mg/dL — ABNORMAL HIGH (ref 0.44–1.00)
GFR calc Af Amer: 52 mL/min — ABNORMAL LOW (ref >60)
GFR calc non Af Amer: 45 mL/min — ABNORMAL LOW (ref >60)
Glucose, Bld: 362 mg/dL — ABNORMAL HIGH (ref 70–99)
Potassium: 3.4 mmol/L — ABNORMAL LOW (ref 3.5–5.1)
Sodium: 140 mmol/L (ref 135–145)

## 2017-12-29 LAB — HEPATIC FUNCTION PANEL
ALT: 19 U/L (ref 0–44)
AST: 24 U/L (ref 15–41)
Albumin: 3.4 g/dL — ABNORMAL LOW (ref 3.5–5.0)
Alkaline Phosphatase: 112 U/L (ref 38–126)
Bilirubin, Direct: <0.1 mg/dL (ref 0.0–0.2)
Total Bilirubin: 0.5 mg/dL (ref 0.3–1.2)
Total Protein: 6.3 g/dL — ABNORMAL LOW (ref 6.5–8.1)

## 2017-12-29 LAB — CBC
HCT: 35.2 % — ABNORMAL LOW (ref 36.0–46.0)
Hemoglobin: 11.1 g/dL — ABNORMAL LOW (ref 12.0–15.0)
MCH: 30 pg (ref 26.0–34.0)
MCHC: 31.5 g/dL (ref 30.0–36.0)
MCV: 95.1 fL (ref 80.0–100.0)
Platelets: 273 10*3/uL (ref 150–400)
RBC: 3.7 MIL/uL — ABNORMAL LOW (ref 3.87–5.11)
RDW: 15 % (ref 11.5–15.5)
WBC: 13.9 10*3/uL — ABNORMAL HIGH (ref 4.0–10.5)
nRBC: 0 % (ref 0.0–0.2)

## 2017-12-29 LAB — MRSA PCR SCREENING: MRSA by PCR: NEGATIVE

## 2017-12-29 LAB — GLUCOSE, CAPILLARY
Glucose-Capillary: 345 mg/dL — ABNORMAL HIGH (ref 70–99)
Glucose-Capillary: 381 mg/dL — ABNORMAL HIGH (ref 70–99)
Glucose-Capillary: 118 mg/dL — ABNORMAL HIGH (ref 70–99)
Glucose-Capillary: 233 mg/dL — ABNORMAL HIGH (ref 70–99)

## 2017-12-29 LAB — MAGNESIUM: Magnesium: 1.6 mg/dL — ABNORMAL LOW (ref 1.7–2.4)

## 2017-12-29 LAB — TROPONIN I: Troponin I: <0.03 ng/mL (ref <0.03)

## 2017-12-29 LAB — HIV ANTIBODY (ROUTINE TESTING W REFLEX): HIV Screen 4th Generation wRfx: NONREACTIVE

## 2017-12-29 LAB — BRAIN NATRIURETIC PEPTIDE: B Natriuretic Peptide: 114.5 pg/mL — ABNORMAL HIGH (ref 0.0–100.0)

## 2017-12-29 LAB — T4, FREE: Free T4: 0.84 ng/dL (ref 0.82–1.77)

## 2017-12-29 LAB — TSH: TSH: 0.251 u[IU]/mL — ABNORMAL LOW (ref 0.350–4.500)

## 2017-12-29 MED ORDER — METHYLPREDNISOLONE SODIUM SUCC 40 MG IJ SOLR
40.0000 mg | Freq: Two times a day (BID) | INTRAMUSCULAR | Status: DC
  Administered 2017-12-29 – 2017-12-30 (×3): 40 mg via INTRAVENOUS
  Filled 2017-12-29 (×3): qty 1

## 2017-12-29 MED ORDER — MONTELUKAST SODIUM 10 MG PO TABS
10.0000 mg | ORAL_TABLET | Freq: Every day | ORAL | Status: DC
  Administered 2017-12-29 (×2): 10 mg via ORAL
  Filled 2017-12-29 (×2): qty 1

## 2017-12-29 MED ORDER — MAGNESIUM SULFATE 2 GM/50ML IV SOLN
2.0000 g | Freq: Once | INTRAVENOUS | Status: AC
  Administered 2017-12-29: 2 g via INTRAVENOUS
  Filled 2017-12-29: qty 50

## 2017-12-29 MED ORDER — LEVOTHYROXINE SODIUM 25 MCG PO TABS
25.0000 ug | ORAL_TABLET | ORAL | Status: DC
  Administered 2017-12-29: 25 ug via ORAL
  Filled 2017-12-29: qty 1

## 2017-12-29 MED ORDER — ATENOLOL 25 MG PO TABS
25.0000 mg | ORAL_TABLET | Freq: Every day | ORAL | Status: DC
  Administered 2017-12-29 – 2017-12-30 (×2): 25 mg via ORAL
  Filled 2017-12-29 (×3): qty 1

## 2017-12-29 MED ORDER — HYDROCHLOROTHIAZIDE 25 MG PO TABS: 25.0000 mg | ORAL_TABLET | Freq: Every day | ORAL | Status: DC

## 2017-12-29 MED ORDER — BACLOFEN 5 MG HALF TABLET
10.0000 mg | ORAL_TABLET | Freq: Every day | ORAL | Status: DC
  Administered 2017-12-29 (×2): 10 mg via ORAL
  Filled 2017-12-29: qty 2
  Filled 2017-12-29: qty 1

## 2017-12-29 MED ORDER — ONDANSETRON HCL 4 MG PO TABS: 4.0000 mg | ORAL_TABLET | Freq: Four times a day (QID) | ORAL | Status: DC | PRN

## 2017-12-29 MED ORDER — IPRATROPIUM BROMIDE 0.02 % IN SOLN: 0.5000 mg | RESPIRATORY_TRACT | Status: DC

## 2017-12-29 MED ORDER — INSULIN DETEMIR 100 UNIT/ML ~~LOC~~ SOLN
10.0000 [IU] | Freq: Every day | SUBCUTANEOUS | Status: DC
  Administered 2017-12-29 – 2017-12-30 (×2): 10 [IU] via SUBCUTANEOUS
  Filled 2017-12-29 (×2): qty 0.1

## 2017-12-29 MED ORDER — METHOCARBAMOL 500 MG PO TABS
500.0000 mg | ORAL_TABLET | Freq: Every day | ORAL | Status: DC
  Administered 2017-12-29 (×2): 500 mg via ORAL
  Filled 2017-12-29 (×2): qty 1

## 2017-12-29 MED ORDER — TRAZODONE HCL 100 MG PO TABS
100.0000 mg | ORAL_TABLET | Freq: Every day | ORAL | Status: DC
  Administered 2017-12-29 (×2): 100 mg via ORAL
  Filled 2017-12-29 (×2): qty 1

## 2017-12-29 MED ORDER — PREGABALIN 75 MG PO CAPS
75.0000 mg | ORAL_CAPSULE | Freq: Two times a day (BID) | ORAL | Status: DC
  Administered 2017-12-29 – 2017-12-30 (×4): 75 mg via ORAL
  Filled 2017-12-29 (×4): qty 1

## 2017-12-29 MED ORDER — ATORVASTATIN CALCIUM 20 MG PO TABS
20.0000 mg | ORAL_TABLET | Freq: Every day | ORAL | Status: DC
  Administered 2017-12-29 – 2017-12-30 (×2): 20 mg via ORAL
  Filled 2017-12-29 (×3): qty 1

## 2017-12-29 MED ORDER — ACETAMINOPHEN 325 MG PO TABS: 650.0000 mg | ORAL_TABLET | Freq: Four times a day (QID) | ORAL | Status: DC | PRN

## 2017-12-29 MED ORDER — BUDESONIDE 0.25 MG/2ML IN SUSP
0.2500 mg | Freq: Two times a day (BID) | RESPIRATORY_TRACT | Status: DC
  Administered 2017-12-29 – 2017-12-30 (×3): 0.25 mg via RESPIRATORY_TRACT
  Filled 2017-12-29 (×3): qty 2

## 2017-12-29 MED ORDER — POTASSIUM CHLORIDE CRYS ER 20 MEQ PO TBCR
40.0000 meq | EXTENDED_RELEASE_TABLET | Freq: Once | ORAL | Status: AC
  Administered 2017-12-29: 40 meq via ORAL
  Filled 2017-12-29: qty 2

## 2017-12-29 MED ORDER — ALBUTEROL SULFATE (2.5 MG/3ML) 0.083% IN NEBU: 2.5000 mg | INHALATION_SOLUTION | RESPIRATORY_TRACT | Status: DC

## 2017-12-29 MED ORDER — VENLAFAXINE HCL ER 75 MG PO CP24
150.0000 mg | ORAL_CAPSULE | Freq: Every day | ORAL | Status: DC
  Administered 2017-12-30: 150 mg via ORAL
  Filled 2017-12-29: qty 2
  Filled 2017-12-29: qty 1
  Filled 2017-12-29: qty 2

## 2017-12-29 MED ORDER — IPRATROPIUM-ALBUTEROL 0.5-2.5 (3) MG/3ML IN SOLN
3.0000 mL | RESPIRATORY_TRACT | Status: DC
  Administered 2017-12-29 – 2017-12-30 (×6): 3 mL via RESPIRATORY_TRACT
  Filled 2017-12-29 (×6): qty 3

## 2017-12-29 MED ORDER — ROFLUMILAST 500 MCG PO TABS
500.0000 ug | ORAL_TABLET | Freq: Every day | ORAL | Status: DC
  Administered 2017-12-29 – 2017-12-30 (×2): 500 ug via ORAL
  Filled 2017-12-29 (×2): qty 1

## 2017-12-29 MED ORDER — ENOXAPARIN SODIUM 40 MG/0.4ML ~~LOC~~ SOLN
40.0000 mg | SUBCUTANEOUS | Status: DC
  Administered 2017-12-29 – 2017-12-30 (×2): 40 mg via SUBCUTANEOUS
  Filled 2017-12-29 (×2): qty 0.4

## 2017-12-29 MED ORDER — HYDRALAZINE HCL 20 MG/ML IJ SOLN: 5.0000 mg | INTRAMUSCULAR | Status: DC | PRN

## 2017-12-29 MED ORDER — LEVOFLOXACIN IN D5W 750 MG/150ML IV SOLN
750.0000 mg | INTRAVENOUS | Status: DC
  Administered 2017-12-29 – 2017-12-30 (×2): 750 mg via INTRAVENOUS
  Filled 2017-12-29 (×2): qty 150

## 2017-12-29 MED ORDER — INSULIN ASPART 100 UNIT/ML ~~LOC~~ SOLN
0.0000 [IU] | Freq: Every day | SUBCUTANEOUS | Status: DC
  Administered 2017-12-29: 2 [IU] via SUBCUTANEOUS

## 2017-12-29 MED ORDER — INSULIN ASPART 100 UNIT/ML ~~LOC~~ SOLN
0.0000 [IU] | Freq: Three times a day (TID) | SUBCUTANEOUS | Status: DC
  Administered 2017-12-29: 7 [IU] via SUBCUTANEOUS
  Filled 2017-12-29: qty 1

## 2017-12-29 MED ORDER — ALBUTEROL SULFATE (2.5 MG/3ML) 0.083% IN NEBU: 2.5000 mg | INHALATION_SOLUTION | RESPIRATORY_TRACT | Status: DC | PRN

## 2017-12-29 MED ORDER — ACETAMINOPHEN 650 MG RE SUPP: 650.0000 mg | Freq: Four times a day (QID) | RECTAL | Status: DC | PRN

## 2017-12-29 MED ORDER — VALACYCLOVIR HCL 500 MG PO TABS
500.0000 mg | ORAL_TABLET | Freq: Every day | ORAL | Status: DC
  Administered 2017-12-29 – 2017-12-30 (×2): 500 mg via ORAL
  Filled 2017-12-29 (×3): qty 1

## 2017-12-29 MED ORDER — ONDANSETRON HCL 4 MG/2ML IJ SOLN: 4.0000 mg | Freq: Four times a day (QID) | INTRAMUSCULAR | Status: DC | PRN

## 2017-12-29 MED ORDER — INSULIN ASPART 100 UNIT/ML ~~LOC~~ SOLN
0.0000 [IU] | Freq: Three times a day (TID) | SUBCUTANEOUS | Status: DC
  Administered 2017-12-29: 15 [IU] via SUBCUTANEOUS
  Administered 2017-12-30: 8 [IU] via SUBCUTANEOUS
  Administered 2017-12-30: 2 [IU] via SUBCUTANEOUS
  Filled 2017-12-29: qty 1

## 2017-12-29 NOTE — Progress Notes (Signed)
Placed on CPAP as documented.

## 2017-12-29 NOTE — Progress Notes (Signed)
Pt ambulated in hall on 2L/O2. Pt experience mid SOB. Pt int'l O2 sat was 86 but incr to 92% after deep breathing & coughing exercise. Pt o2 at rest was 92& post ambulating approx 1100 ft in hallway

## 2017-12-29 NOTE — H&P (Signed)
History and Physical    Joan [name] KKX:[id number] DOB: May 23, 1960 DOA: 12/28/2017  Referring MD/NP/PA:  PCP: [name], MD  Patient coming from: home  Chief Complaint: sob  HPI: Joan [name] is a 57 y.o. female with medical history significant of Joan [name] is a 57 y.o. female with history of COPD with ongoing tobacco abuse, sleep apnea, diabetes mellitus type 2 secondary to steroids, hypertension, chronic pain and spasms presented to the ER  with complaints of shortness of breath.  Patient has been having shortness of breath for last week and a half and has been progressively getting worse with wheezing.  Symptoms are mostly exertional denies any chest pain.  Denies any fever chills nausea vomiting abdominal pain denies any sick contacts   ED Course: In the ER on exam patient has diffuse wheezing.  Chest x-ray shows COPD features.  EKG shows normal sinus rhythm with RBBB.  Patient was given nebulizer treatment and steroid despite which patient was still wheezing and admitted for further management of COPD exacerbati  Review of Systems: As per HPI otherwise 10 point review of systems negative.  All systems reviewed and all negative except as in HPI  Past Medical History:  Diagnosis Date  . Acute respiratory failure with hypoxia ([place])   . Alcoholism and drug addiction in family   . Anxiety   . Arthritis    "knees, hips, lower back" (09/25/2016)  . Asthma   . Bipolar disorder ([place])   . Chronic bronchitis ([place])    "all the time" (09/25/2016)  . Chronic leg pain    RLE  . Chronic lower back pain   . COPD (chronic obstructive pulmonary disease) ([place])   . Depression   . Emphysema of lung ([place])   . GERD (gastroesophageal reflux disease)   . Headache   . Hepatitis C    "treated back in 2001-2002"  . High cholesterol   . Hypertension   . Hypothyroidism   . Incarcerated ventral hernia 04/04/2017  . On home oxygen therapy    "2L; at nighttime" (04/04/2017)  . OSA on CPAP     . Pneumonia    "all the time" (09/25/2016)  . Positive PPD    with negative chest x ray  . Tachycardia    patient reports with exertion ; denies any other conditions   . Thyroid disease     Past Surgical History:  Procedure Laterality Date  . BACK SURGERY    . CARDIAC CATHETERIZATION    . COLONOSCOPY    . CYST EXCISION     removal of cyst in sinuses  . DILATION AND CURETTAGE OF UTERUS    . FOOT SURGERY Bilateral    bone removed from 4th and 5 th toes and put in pin then took pin out  . HERNIA REPAIR    . INCISION AND DRAINAGE  ~ 2014/2015   "removed mesh & infection"  . INCISIONAL HERNIA REPAIR N/A 04/04/2017   Procedure: LAPAROSCOPIC INCISIONAL HERNIA REPAIR WITH MESH;  Surgeon: Fanny Skates, MD;  Location: [facility];  Service: General;  Laterality: N/A;  . LACERATION REPAIR Right    repair wrist artery from laceration from ice skate  . LAPAROSCOPIC INCISIONAL / UMBILICAL / VENTRAL HERNIA REPAIR  04/04/2017   LAPAROSCOPIC INCISIONAL HERNIA REPAIR WITH MESH  . LIPOMA EXCISION Right    fattty lipoma in neck  . NASAL SEPTUM SURGERY    . POSTERIOR LUMBAR FUSION  2015/2016 X 2   "added  rods and screws"  . TOTAL HIP ARTHROPLASTY Right 02/05/2017   Procedure: RIGHT TOTAL HIP ARTHROPLASTY;  Surgeon: [name], MD;  Location: [facility];  Service: Orthopedics;  Laterality: Right;  . TOTAL HIP ARTHROPLASTY Left 06/18/2017   Procedure: LEFT TOTAL HIP ARTHROPLASTY;  Surgeon: [name], MD;  Location: [facility];  Service: Orthopedics;  Laterality: Left;  . TUBAL LIGATION    . UMBILICAL HERNIA REPAIR  ~ 2013   w/mesh  . UPPER GI ENDOSCOPY       reports that she quit smoking about 2 months ago. Her smoking use included cigarettes. She has a 44.00 pack-year smoking history. She has never used smokeless tobacco. She reports that she drinks alcohol. She reports that she has current or past drug history. Drug: "Crack" cocaine.  Allergies  Allergen Reactions  . Keflex [Cephalexin] Other  (See Comments)    unspecified  . Prednisone Other (See Comments)    "counter reacts"  . Shellfish Allergy Other (See Comments)    Stomach cramps  . Tetracyclines & Related Other (See Comments)    unspecified  . Dilaudid [Hydromorphone Hcl] Other (See Comments)    "Lethargy"  . Incruse Ellipta [Umeclidinium Bromide] Nausea Only  . Tuberculin Tests Other (See Comments)    Family History  Problem Relation Age of Onset  . Diabetes Mother   . Hypertension Mother   . Hypertension Father   . Cancer Father        lung     Prior to Admission medications   Medication Sig Start Date End Date Taking? Authorizing Provider  albuterol (PROAIR HFA) 108 (90 Base) MCG/ACT inhaler Inhale 1-2 puffs into the lungs every 6 (six) hours as needed for wheezing or shortness of breath. 09/08/17  Yes [name], MD  albuterol (PROVENTIL) (2.5 MG/3ML) 0.083% nebulizer solution Take 3 mLs (2.5 mg total) by nebulization 2 (two) times daily. And every 6 hours as needed 08/07/17  Yes Tanda Rockers, MD  atenolol (TENORMIN) 25 MG tablet Take 1 tablet (25 mg total) by mouth daily. 09/08/17  Yes [name], MD  atorvastatin (LIPITOR) 20 MG tablet Take 1 tablet (20 mg total) by mouth daily. 09/08/17  Yes [name], MD  baclofen (LIORESAL) 10 MG tablet Take 1 tablet (10 mg total) by mouth at bedtime. 10/28/17  Yes [name], MD  betamethasone valerate ointment (VALISONE) 0.1 % Apply 1 application topically 2 (two) times daily. 12/09/17  Yes [name], MD  diphenhydrAMINE (BENADRYL) 25 MG tablet Take 50 mg by mouth daily.   Yes [provider]  hydrochlorothiazide (HYDRODIURIL) 25 MG tablet Take 1 tablet (25 mg total) by mouth daily. 12/09/17  Yes [name], MD  Ipratropium-Albuterol (COMBIVENT RESPIMAT) 20-100 MCG/ACT AERS respimat Inhale 1 puff into the lungs every 6 (six) hours as needed for wheezing. 09/17/17  Yes [name], MD  levothyroxine  (SYNTHROID, LEVOTHROID) 25 MCG tablet Take 1 tablet (25 mcg total) by mouth daily before breakfast. 09/08/17  Yes [name], MD  methocarbamol (ROBAXIN) 500 MG tablet Take 500 mg by mouth at bedtime.   Yes [provider]  montelukast (SINGULAIR) 10 MG tablet Take 1 tablet (10 mg total) by mouth at bedtime. 09/08/17  Yes [name], MD  naproxen (NAPROSYN) 500 MG tablet Take 1,000 mg by mouth 2 (two) times daily as needed for mild pain.  06/21/17  Yes [provider]  nystatin-triamcinolone ointment (MYCOLOG) Apply 1 application topically 2 (  two) times daily as needed (applied to skin folds/groins/under breast skin irritation rash.). 09/08/17  Yes [name], MD  pantoprazole (PROTONIX) 40 MG tablet Take 1 tablet (40 mg total) by mouth daily. Patient taking differently: Take 40 mg by mouth 2 (two) times daily.  09/08/17  Yes [name], MD  pregabalin (LYRICA) 75 MG capsule Take 1 capsule (75 mg total) by mouth 2 (two) times daily. 12/09/17  Yes [name], MD  roflumilast (DALIRESP) 500 MCG TABS tablet Take 1 tablet (500 mcg total) by mouth daily. 09/08/17  Yes [name], MD  SPIRIVA RESPIMAT 2.5 MCG/ACT AERS Inhale 2 puffs into the lungs every morning. 12/19/17  Yes [provider]  SYMBICORT 160-4.5 MCG/ACT inhaler Inhale 2 puffs into the lungs 2 (two) times daily. 09/08/17  Yes [name], MD  traZODone (DESYREL) 100 MG tablet Take 1 tablet (100 mg total) by mouth at bedtime. 12/09/17  Yes [name], MD  valACYclovir (VALTREX) 500 MG tablet Take 1 tablet (500 mg total) by mouth daily. 09/08/17  Yes [name], MD  venlafaxine XR (EFFEXOR-XR) 150 MG 24 hr capsule TAKE 1 CAPSULE DAILY WITH  BREAKFAST Patient taking differently: Take 150 mg by mouth daily with breakfast.  09/08/17  Yes [name], MD  Melatonin 5 MG CAPS Take 1 capsule (5 mg total) by mouth at bedtime. Patient not taking:  Reported on 12/28/2017 06/09/17   [name], MD    Physical Exam: Vitals:   12/29/17 0500 12/29/17 0545 12/29/17 0707 12/29/17 1106  BP:  111/60  117/68  Pulse:  80  80  Resp:  20  20  Temp:  (!) 97.4 F (36.3 C)    TempSrc:  Oral    SpO2:  91% (!) 87% 94%  Weight: 99.5 kg     Height:          Constitutional: NAD, calm, comfortable Vitals:   12/29/17 0500 12/29/17 0545 12/29/17 0707 12/29/17 1106  BP:  111/60  117/68  Pulse:  80  80  Resp:  20  20  Temp:  (!) 97.4 F (36.3 C)    TempSrc:  Oral    SpO2:  91% (!) 87% 94%  Weight: 99.5 kg     Height:       Eyes: PERRL, lids and conjunctivae normal ENMT: Mucous membranes are moist. Posterior pharynx clear of any exudate or lesions.Normal dentition.  Neck: normal, supple, no masses, no thyromegaly Respiratory: mild increased work of breathing with conversation, using abdominal accessory muscles, BS quite diminished throughout. Very faint end expiratory wheezes on right. No crackles  Cardiovascular: Regular rate and rhythm, no murmurs / rubs / gallops. No extremity edema. 2+ pedal pulses. No carotid bruits.  Abdomen: no tenderness, no masses palpated. No hepatosplenomegaly. Bowel sounds positive.  Musculoskeletal: no clubbing / cyanosis. No joint deformity upper and lower extremities. Good ROM, no contractures. Normal muscle tone.  Skin: no rashes, lesions, ulcers. No induration Neurologic: CN 2-12 grossly intact. Sensation intact, DTR normal. Strength 5/5 in all 4.  Psychiatric: Normal judgment and insight. Alert and oriented x 3. Normal mood.     Labs on Admission: I have personally reviewed following labs and imaging studies  CBC: Recent Labs  Lab 12/28/17 2008 12/29/17 0609  WBC 19.2* 13.9*  HGB 11.7* 11.1*  HCT 37.9 35.2*  MCV 95.5 95.1  PLT 361 867   Basic Metabolic Panel: Recent Labs  Lab 12/28/17 2008 12/29/17 6720  NA 141 140  K 3.6 3.4*  CL 105 106  CO2 26 24  GLUCOSE 143* 362*  BUN 18  20  CREATININE 1.36* 1.31*  CALCIUM 8.8* 8.3*  MG  --  1.6*   GFR: Estimated Creatinine Clearance: 59.1 mL/min (A) (by C-G formula based on SCr of 1.31 mg/dL (H)). Liver Function Tests: Recent Labs  Lab 12/29/17 0609  AST 24  ALT 19  ALKPHOS 112  BILITOT 0.5  PROT 6.3*  ALBUMIN 3.4*   No results for input(s): LIPASE, AMYLASE in the last 168 hours. No results for input(s): AMMONIA in the last 168 hours. Coagulation Profile: No results for input(s): INR, PROTIME in the last 168 hours. Cardiac Enzymes: Recent Labs  Lab 12/29/17 0609  TROPONINI <0.03   BNP (last 3 results) No results for input(s): PROBNP in the last 8760 hours. HbA1C: No results for input(s): HGBA1C in the last 72 hours. CBG: Recent Labs  Lab 12/29/17 0716 12/29/17 1115  GLUCAP 345* 381*   Lipid Profile: No results for input(s): CHOL, HDL, LDLCALC, TRIG, CHOLHDL, LDLDIRECT in the last 72 hours. Thyroid Function Tests: Recent Labs    12/29/17 0609 12/29/17 0847  TSH 0.251*  --   FREET4  --  0.84   Anemia Panel: No results for input(s): VITAMINB12, FOLATE, FERRITIN, TIBC, IRON, RETICCTPCT in the last 72 hours. Urine analysis:    Component Value Date/Time   COLORURINE YELLOW 08/05/2017 [place] 08/05/2017 1652   LABSPEC 1.025 08/05/2017 1652   PHURINE 5.0 08/05/2017 1652   GLUCOSEU NEGATIVE 08/05/2017 1652   HGBUR TRACE-INTACT (A) 08/05/2017 1652   BILIRUBINUR NEGATIVE 08/05/2017 1652   KETONESUR NEGATIVE 08/05/2017 1652   PROTEINUR NEGATIVE 06/02/2017 2258   UROBILINOGEN 0.2 08/05/2017 1652   NITRITE NEGATIVE 08/05/2017 1652   LEUKOCYTESUR NEGATIVE 08/05/2017 1652   Sepsis Labs: @LABRCNTIP (procalcitonin:4,lacticidven:4) ) Recent Results (from the past 240 hour(s))  MRSA PCR Screening     Status: None   Collection Time: 12/29/17  2:07 AM  Result Value Ref Range Status   MRSA by PCR NEGATIVE NEGATIVE Final    Comment:        The GeneXpert MRSA Assay (FDA approved  for NASAL specimens only), is one component of a comprehensive MRSA colonization surveillance program. It is not intended to diagnose MRSA infection nor to guide or monitor treatment for MRSA infections. Performed at [facility], [place] [address], [place], [place] [address]      Radiological Exams on Admission: Dg Chest 2 View  Result Date: 12/28/2017 CLINICAL DATA:  Shortness of breath EXAM: CHEST - 2 VIEW COMPARISON:  11/28/2017 FINDINGS: Cardiac shadow is within normal limits. The lungs are hyperinflated similar to that seen on prior exam. No acute infiltrate or sizable effusion is seen bony abnormality is noted IMPRESSION: COPD without acute abnormality. Electronically Signed   By: Inez Catalina M.D.   On: 12/28/2017 20:41    EKG: Independently reviewed. Normal sinus rhythm Incomplete right bundle branch block Prolonged QT Abnormal ECG  Assessment/Plan Principal Problem:   Acute respiratory failure with hypoxia ([facility]) Active Problems:   Essential hypertension   COPD exacerbation ([facility])   Hypothyroidism due to acquired atrophy of thyroid   OSA on CPAP   Hypomagnesemia   Prediabetes   1. Acute respiratory failure with hypoxia likely from COPD exacerbation. oxygen saturation level 87% on room air. will continue scheduled nebs, steroids and Levaquin,  Leukocytosis likely  from prednisone use by patient. She is afebrile  non-toxic. Home oxygen at 2L. Will ambulate 2. Hypertension on atenolol and hydrochlorothiazide. Fair control. Holdingf hydrochlorothiazide since patient has mildly elevated creatinine from baseline.  Will keep patient on PRN IV hydralazine. 3. Diabetes mellitus type 2 second steroids on metformin as per patient.  Continue to hold metformin while inpatient and keep patient on sliding scale coverage. Will add levemir per diabetes coordination  Medication reconciliation does not show metformin in the list. 4. Sleep apnea on CPAP. 5. Hyperlipidemia on  statins. 6. Hypothyroidism. TSH 0.251. Will hold synthroid for now. Check free T4. Will need OP follow up. 7. Acute renal failure - mild. Creatinine 1.3. Continue to hold hydrochlorothiazide and follow metabolic panel.   8. Chronic pain on baclofen Lyrica and as needed Robaxin. 9. Depression on trazodone and Cymbalta. 10. Chronic anemia follow CBC. 11. Tobacco abuse -tobacco cessation counseling requested. 12. Hypokalemia/hypomagnesemia. repleted  And will recheck    DVT prophylaxis: lovenox Code Status: full Family Communication: none present Disposition Plan: home Consults called: none Admission status: inpatient   Radene Gunning NP [facility]   If 7PM-7AM, please contact night-coverage [email or web address] Password [facility]  12/29/2017, 12:33 PM

## 2017-12-29 NOTE — Progress Notes (Signed)
Pharmacy Antibiotic Note  Joan [name] is a 57 y.o. female admitted on 12/28/2017 with bronchitis.  Pharmacy has been consulted for levaquin dosing.  Plan: Levaquin 750 mg IV q24 hours F/u clinical course Height: 5\' 8"  (172.7 cm) Weight: 216 lb 14.9 oz (98.4 kg) IBW/kg (Calculated) : 63.9  Temp (24hrs), Avg:98.5 F (36.9 C), Min:98.1 F (36.7 C), Max:99 F (37.2 C)  Recent Labs  Lab 12/28/17 2008  WBC 19.2*  CREATININE 1.36*    Estimated Creatinine Clearance: 56.7 mL/min (A) (by C-G formula based on SCr of 1.36 mg/dL (H)).    Allergies  Allergen Reactions  . Keflex [Cephalexin] Other (See Comments)    unspecified  . Prednisone Other (See Comments)    "counter reacts"  . Shellfish Allergy Other (See Comments)    Stomach cramps  . Tetracyclines & Related Other (See Comments)    unspecified  . Dilaudid [Hydromorphone Hcl] Other (See Comments)    "Lethargy"  . Incruse Ellipta [Umeclidinium Bromide] Nausea Only  . Tuberculin Tests Other (See Comments)    Thank you for allowing pharmacy to be a part of this patient's care.  Excell Seltzer Poteet 12/29/2017 1:36 AM

## 2017-12-29 NOTE — Progress Notes (Signed)
Inpatient Diabetes Program Recommendations  AACE/ADA: New Consensus Statement on Inpatient Glycemic Control (2015)  Target Ranges:  Prepandial:   less than 140 mg/dL      Peak postprandial:   less than 180 mg/dL (1-2 hours)      Critically ill patients:  140 - 180 mg/dL   Lab Results  Component Value Date   GLUCAP 381 (H) 12/29/2017   HGBA1C 6.1 (H) 06/11/2017    Review of Glycemic Control Results for Joan [name], Joan [name] (MRN [id number]) as of 12/29/2017 12:33  Ref. Range 12/29/2017 07:16 12/29/2017 11:15  Glucose-Capillary Latest Ref Range: 70 - 99 mg/dL 345 (H) 381 (H)   Diabetes history: Prediabetes Outpatient Diabetes medications: Metformin? Current orders for Inpatient glycemic control:  Novolog moderate tid with meals and HS Solumedrol 40 mg IV q 12 hours Inpatient Diabetes Program Recommendations:    Note that blood sugars increased with steroids.  May consider checking A1C to determine past 2-3 months of glycemic control.  While in the hospital and on steroids, consider adding Levemir 10 units bid.  Also may benefit from q 4 hour Novolog correction while on IV steroids. Will follow.   Thanks,  Adah Perl, RN, BC-ADM Inpatient Diabetes Coordinator Pager [phone] (8a-5p)

## 2017-12-29 NOTE — Progress Notes (Signed)
Pt placed on nasal AUTO cpap with 4L bleed in and tolerating well at this time.  RT will continue to monitor.

## 2017-12-29 NOTE — H&P (Addendum)
History and Physical    Joan [name] NLZ:[id number] DOB: 04-10-1960 DOA: 12/28/2017  PCP: [name], MD  Patient coming from: Home.  Chief Complaint: Shortness of breath.  HPI: Joan [name] is a 57 y.o. female with history of COPD with ongoing tobacco abuse, sleep apnea, diabetes mellitus type 2 secondary to steroids, hypertension, chronic pain and spasms presents to the ER with complaints of shortness of breath.  Patient has been having shortness of breath for last week and a half and has been progressively getting worse with wheezing.  Symptoms are mostly exertional denies any chest pain.  Denies any fever chills nausea vomiting abdominal pain denies any sick contacts.  ED Course: In the ER on exam patient has diffuse wheezing.  Chest x-ray shows COPD features.  EKG shows normal sinus rhythm with RBBB.  Patient was given nebulizer treatment and steroid despite which patient was still wheezing and admitted for further management of COPD exacerbation.  Review of Systems: As per HPI, rest all negative.   Past Medical History:  Diagnosis Date  . Alcoholism and drug addiction in family   . Anxiety   . Arthritis    "knees, hips, lower back" (09/25/2016)  . Asthma   . Bipolar disorder ([place])   . Chronic bronchitis ([place])    "all the time" (09/25/2016)  . Chronic leg pain    RLE  . Chronic lower back pain   . COPD (chronic obstructive pulmonary disease) ([place])   . Depression   . Emphysema of lung ([place])   . GERD (gastroesophageal reflux disease)   . Headache   . Hepatitis C    "treated back in 2001-2002"  . High cholesterol   . Hypertension   . Hypothyroidism   . Incarcerated ventral hernia 04/04/2017  . On home oxygen therapy    "2L; at nighttime" (04/04/2017)  . OSA on CPAP   . Pneumonia    "all the time" (09/25/2016)  . Positive PPD    with negative chest x ray  . Tachycardia    patient reports with exertion ; denies any other conditions   . Thyroid disease     Past  Surgical History:  Procedure Laterality Date  . BACK SURGERY    . CARDIAC CATHETERIZATION    . COLONOSCOPY    . CYST EXCISION     removal of cyst in sinuses  . DILATION AND CURETTAGE OF UTERUS    . FOOT SURGERY Bilateral    bone removed from 4th and 5 th toes and put in pin then took pin out  . HERNIA REPAIR    . INCISION AND DRAINAGE  ~ 2014/2015   "removed mesh & infection"  . INCISIONAL HERNIA REPAIR N/A 04/04/2017   Procedure: LAPAROSCOPIC INCISIONAL HERNIA REPAIR WITH MESH;  Surgeon: Fanny Skates, MD;  Location: [facility];  Service: General;  Laterality: N/A;  . LACERATION REPAIR Right    repair wrist artery from laceration from ice skate  . LAPAROSCOPIC INCISIONAL / UMBILICAL / VENTRAL HERNIA REPAIR  04/04/2017   LAPAROSCOPIC INCISIONAL HERNIA REPAIR WITH MESH  . LIPOMA EXCISION Right    fattty lipoma in neck  . NASAL SEPTUM SURGERY    . POSTERIOR LUMBAR FUSION  2015/2016 X 2   "added rods and screws"  . TOTAL HIP ARTHROPLASTY Right 02/05/2017   Procedure: RIGHT TOTAL HIP ARTHROPLASTY;  Surgeon: [name], MD;  Location: [facility];  Service: Orthopedics;  Laterality: Right;  . TOTAL HIP ARTHROPLASTY Left 06/18/2017  Procedure: LEFT TOTAL HIP ARTHROPLASTY;  Surgeon: [name], MD;  Location: [facility];  Service: Orthopedics;  Laterality: Left;  . TUBAL LIGATION    . UMBILICAL HERNIA REPAIR  ~ 2013   w/mesh  . UPPER GI ENDOSCOPY       reports that she quit smoking about 2 months ago. Her smoking use included cigarettes. She has a 44.00 pack-year smoking history. She has never used smokeless tobacco. She reports that she drinks alcohol. She reports that she has current or past drug history. Drug: "Crack" cocaine.  Allergies  Allergen Reactions  . Keflex [Cephalexin] Other (See Comments)    unspecified  . Prednisone Other (See Comments)    "counter reacts"  . Shellfish Allergy Other (See Comments)    Stomach cramps  . Tetracyclines & Related Other (See Comments)     unspecified  . Dilaudid [Hydromorphone Hcl] Other (See Comments)    "Lethargy"  . Incruse Ellipta [Umeclidinium Bromide] Nausea Only  . Tuberculin Tests Other (See Comments)    Family History  Problem Relation Age of Onset  . Diabetes Mother   . Hypertension Mother   . Hypertension Father   . Cancer Father        lung    Prior to Admission medications   Medication Sig Start Date End Date Taking? Authorizing Provider  albuterol (PROAIR HFA) 108 (90 Base) MCG/ACT inhaler Inhale 1-2 puffs into the lungs every 6 (six) hours as needed for wheezing or shortness of breath. 09/08/17  Yes [name], MD  albuterol (PROVENTIL) (2.5 MG/3ML) 0.083% nebulizer solution Take 3 mLs (2.5 mg total) by nebulization 2 (two) times daily. And every 6 hours as needed 08/07/17  Yes Tanda Rockers, MD  atenolol (TENORMIN) 25 MG tablet Take 1 tablet (25 mg total) by mouth daily. 09/08/17  Yes [name], MD  atorvastatin (LIPITOR) 20 MG tablet Take 1 tablet (20 mg total) by mouth daily. 09/08/17  Yes [name], MD  baclofen (LIORESAL) 10 MG tablet Take 1 tablet (10 mg total) by mouth at bedtime. 10/28/17  Yes [name], MD  betamethasone valerate ointment (VALISONE) 0.1 % Apply 1 application topically 2 (two) times daily. 12/09/17  Yes [name], MD  diphenhydrAMINE (BENADRYL) 25 MG tablet Take 50 mg by mouth daily.   Yes [provider]  hydrochlorothiazide (HYDRODIURIL) 25 MG tablet Take 1 tablet (25 mg total) by mouth daily. 12/09/17  Yes [name], MD  Ipratropium-Albuterol (COMBIVENT RESPIMAT) 20-100 MCG/ACT AERS respimat Inhale 1 puff into the lungs every 6 (six) hours as needed for wheezing. 09/17/17  Yes [name], MD  levothyroxine (SYNTHROID, LEVOTHROID) 25 MCG tablet Take 1 tablet (25 mcg total) by mouth daily before breakfast. 09/08/17  Yes [name], MD  methocarbamol (ROBAXIN) 500 MG tablet Take 500 mg by mouth at  bedtime.   Yes [provider]  montelukast (SINGULAIR) 10 MG tablet Take 1 tablet (10 mg total) by mouth at bedtime. 09/08/17  Yes [name], MD  naproxen (NAPROSYN) 500 MG tablet Take 1,000 mg by mouth 2 (two) times daily as needed for mild pain.  06/21/17  Yes [provider]  nystatin-triamcinolone ointment (MYCOLOG) Apply 1 application topically 2 (two) times daily as needed (applied to skin folds/groins/under breast skin irritation rash.). 09/08/17  Yes [name], MD  pantoprazole (PROTONIX) 40 MG tablet Take 1 tablet (40 mg total) by mouth daily. Patient taking differently: Take 40 mg by  mouth 2 (two) times daily.  09/08/17  Yes [name], MD  pregabalin (LYRICA) 75 MG capsule Take 1 capsule (75 mg total) by mouth 2 (two) times daily. 12/09/17  Yes [name], MD  roflumilast (DALIRESP) 500 MCG TABS tablet Take 1 tablet (500 mcg total) by mouth daily. 09/08/17  Yes [name], MD  SPIRIVA RESPIMAT 2.5 MCG/ACT AERS Inhale 2 puffs into the lungs every morning. 12/19/17  Yes [provider]  SYMBICORT 160-4.5 MCG/ACT inhaler Inhale 2 puffs into the lungs 2 (two) times daily. 09/08/17  Yes [name], MD  traZODone (DESYREL) 100 MG tablet Take 1 tablet (100 mg total) by mouth at bedtime. 12/09/17  Yes [name], MD  valACYclovir (VALTREX) 500 MG tablet Take 1 tablet (500 mg total) by mouth daily. 09/08/17  Yes [name], MD  venlafaxine XR (EFFEXOR-XR) 150 MG 24 hr capsule TAKE 1 CAPSULE DAILY WITH  BREAKFAST Patient taking differently: Take 150 mg by mouth daily with breakfast.  09/08/17  Yes [name], MD  Melatonin 5 MG CAPS Take 1 capsule (5 mg total) by mouth at bedtime. Patient not taking: Reported on 12/28/2017 06/09/17   [name], MD    Physical Exam: Vitals:   12/28/17 2230 12/28/17 2317 12/28/17 2342 12/29/17 0016  BP: 100/61  (!) 110/58 123/70  Pulse: 72 76 79 92    Resp:  16 (!) 29 (!) 22  Temp:    98.5 F (36.9 C)  TempSrc:    Oral  SpO2: 96% 100% 100% 96%  Weight:    98.4 kg  Height:    5\' 8"  (1.727 m)      Constitutional: Moderately built and nourished. Vitals:   12/28/17 2230 12/28/17 2317 12/28/17 2342 12/29/17 0016  BP: 100/61  (!) 110/58 123/70  Pulse: 72 76 79 92  Resp:  16 (!) 29 (!) 22  Temp:    98.5 F (36.9 C)  TempSrc:    Oral  SpO2: 96% 100% 100% 96%  Weight:    98.4 kg  Height:    5\' 8"  (1.727 m)   Eyes: Anicteric no pallor. ENMT: No discharge from the ears eyes nose or mouth. Neck: No mass or.  No neck rigidity.  No JVD appreciated. Respiratory: Bilateral expiratory wheeze and no crepitations. Cardiovascular: S1-S2 heard no murmurs appreciated. Abdomen: Soft nontender bowel sounds present. Musculoskeletal: No edema.  No joint effusion. Skin: No rash. Neurologic: Alert awake oriented to time place and person.  Moves all extremities. Psychiatric: Appears normal per normal affect.   Labs on Admission: I have personally reviewed following labs and imaging studies  CBC: Recent Labs  Lab 12/28/17 2008  WBC 19.2*  HGB 11.7*  HCT 37.9  MCV 95.5  PLT 062   Basic Metabolic Panel: Recent Labs  Lab 12/28/17 2008  NA 141  K 3.6  CL 105  CO2 26  GLUCOSE 143*  BUN 18  CREATININE 1.36*  CALCIUM 8.8*   GFR: Estimated Creatinine Clearance: 56.7 mL/min (A) (by C-G formula based on SCr of 1.36 mg/dL (H)). Liver Function Tests: No results for input(s): AST, ALT, ALKPHOS, BILITOT, PROT, ALBUMIN in the last 168 hours. No results for input(s): LIPASE, AMYLASE in the last 168 hours. No results for input(s): AMMONIA in the last 168 hours. Coagulation Profile: No results for input(s): INR, PROTIME in the last 168 hours. Cardiac Enzymes: No results for input(s): CKTOTAL, CKMB, CKMBINDEX, TROPONINI in the last 168 hours.  BNP (last 3 results) No results for input(s): PROBNP in the last 8760 hours. HbA1C: No results  for input(s): HGBA1C in the last 72 hours. CBG: No results for input(s): GLUCAP in the last 168 hours. Lipid Profile: No results for input(s): CHOL, HDL, LDLCALC, TRIG, CHOLHDL, LDLDIRECT in the last 72 hours. Thyroid Function Tests: No results for input(s): TSH, T4TOTAL, FREET4, T3FREE, THYROIDAB in the last 72 hours. Anemia Panel: No results for input(s): VITAMINB12, FOLATE, FERRITIN, TIBC, IRON, RETICCTPCT in the last 72 hours. Urine analysis:    Component Value Date/Time   COLORURINE YELLOW 08/05/2017 1652   APPEARANCEUR CLEAR 08/05/2017 1652   LABSPEC 1.025 08/05/2017 1652   PHURINE 5.0 08/05/2017 1652   GLUCOSEU NEGATIVE 08/05/2017 1652   HGBUR TRACE-INTACT (A) 08/05/2017 1652   BILIRUBINUR NEGATIVE 08/05/2017 1652   KETONESUR NEGATIVE 08/05/2017 1652   PROTEINUR NEGATIVE 06/02/2017 2258   UROBILINOGEN 0.2 08/05/2017 1652   NITRITE NEGATIVE 08/05/2017 1652   LEUKOCYTESUR NEGATIVE 08/05/2017 1652   Sepsis Labs: @LABRCNTIP (procalcitonin:4,lacticidven:4) )No results found for this or any previous visit (from the past 240 hour(s)).   Radiological Exams on Admission: Dg Chest 2 View  Result Date: 12/28/2017 CLINICAL DATA:  Shortness of breath EXAM: CHEST - 2 VIEW COMPARISON:  11/28/2017 FINDINGS: Cardiac shadow is within normal limits. The lungs are hyperinflated similar to that seen on prior exam. No acute infiltrate or sizable effusion is seen bony abnormality is noted IMPRESSION: COPD without acute abnormality. Electronically Signed   By: Inez Catalina M.D.   On: 12/28/2017 20:41    EKG: Independently reviewed.  Normal sinus rhythm with RBBB.  Assessment/Plan Principal Problem:   COPD exacerbation (HCC) Active Problems:   Essential hypertension   Prediabetes   Hypothyroidism due to acquired atrophy of thyroid   OSA on CPAP    1. Acute respiratory failure with hypoxia likely from COPD exacerbation -patient has been placed on nebulizer treatment Pulmicort steroids and  will place patient on Levaquin given the significant leukocytosis.  Leukocytosis could also be from prednisone use by patient. 2. Hypertension on atenolol and hydrochlorothiazide.  Holding of hydrochlorothiazide since patient has mildly elevated creatinine from baseline.  Will keep patient on PRN IV hydralazine. 3. Diabetes mellitus type 2 second steroids on metformin as per patient.  Will hold metformin while inpatient and keep patient on sliding scale coverage.  Medication reconciliation does not show metformin in the list. 4. Sleep apnea on CPAP. 5. Hyperlipidemia on statins. 6. Hypothyroidism on Synthroid. 7. Acute renal failure -we will hold hydrochlorothiazide and follow metabolic panel.  Check UA. 8. Chronic pain on baclofen Lyrica and as needed Robaxin. 9. Depression on trazodone and Cymbalta. 10. Chronic anemia follow CBC. 11. Tobacco abuse -tobacco cessation counseling requested.   DVT prophylaxis: Lovenox. Code Status: Full code. Family Communication: Discussed with patient. Disposition Plan: Home. Consults called: None. Admission status: Observation.   Rise Patience MD [facility] Pager [phone].  If 7PM-7AM, please contact night-coverage [email or web address] Password [email or web address]  12/29/2017, 1:03 AM

## 2017-12-30 DIAGNOSIS — J9601 Acute respiratory failure with hypoxia: Secondary | ICD-10-CM

## 2017-12-30 DIAGNOSIS — J441 Chronic obstructive pulmonary disease with (acute) exacerbation: Principal | ICD-10-CM

## 2017-12-30 DIAGNOSIS — I10 Essential (primary) hypertension: Secondary | ICD-10-CM

## 2017-12-30 LAB — CBC
HCT: 34.7 % — ABNORMAL LOW (ref 36.0–46.0)
Hemoglobin: 10.6 g/dL — ABNORMAL LOW (ref 12.0–15.0)
MCH: 29 pg (ref 26.0–34.0)
MCHC: 30.5 g/dL (ref 30.0–36.0)
MCV: 95.1 fL (ref 80.0–100.0)
Platelets: 282 10*3/uL (ref 150–400)
RBC: 3.65 MIL/uL — ABNORMAL LOW (ref 3.87–5.11)
RDW: 14.9 % (ref 11.5–15.5)
WBC: 17.3 10*3/uL — ABNORMAL HIGH (ref 4.0–10.5)
nRBC: 0 % (ref 0.0–0.2)

## 2017-12-30 LAB — BASIC METABOLIC PANEL
Anion gap: 4 — ABNORMAL LOW (ref 5–15)
BUN: 24 mg/dL — ABNORMAL HIGH (ref 6–20)
CO2: 32 mmol/L (ref 22–32)
Calcium: 9 mg/dL (ref 8.9–10.3)
Chloride: 104 mmol/L (ref 98–111)
Creatinine, Ser: 1.23 mg/dL — ABNORMAL HIGH (ref 0.44–1.00)
GFR calc Af Amer: 56 mL/min — ABNORMAL LOW (ref >60)
GFR calc non Af Amer: 48 mL/min — ABNORMAL LOW (ref >60)
Glucose, Bld: 203 mg/dL — ABNORMAL HIGH (ref 70–99)
Potassium: 4.5 mmol/L (ref 3.5–5.1)
Sodium: 140 mmol/L (ref 135–145)

## 2017-12-30 LAB — GLUCOSE, CAPILLARY
Glucose-Capillary: 137 mg/dL — ABNORMAL HIGH (ref 70–99)
Glucose-Capillary: 269 mg/dL — ABNORMAL HIGH (ref 70–99)

## 2017-12-30 MED ORDER — GUAIFENESIN-CODEINE 100-10 MG/5ML PO SOLN
5.0000 mL | ORAL | Status: DC | PRN
  Administered 2017-12-30: 5 mL via ORAL
  Filled 2017-12-30: qty 5

## 2017-12-30 MED ORDER — METHYLPREDNISOLONE 4 MG PO TBPK: ORAL_TABLET | ORAL | 0 refills | Status: DC

## 2017-12-30 MED ORDER — GUAIFENESIN-CODEINE 100-10 MG/5ML PO SOLN: 5.0000 mL | ORAL | 0 refills | Status: DC | PRN

## 2017-12-30 MED ORDER — IPRATROPIUM-ALBUTEROL 0.5-2.5 (3) MG/3ML IN SOLN: 3.0000 mL | Freq: Three times a day (TID) | RESPIRATORY_TRACT | Status: DC

## 2017-12-30 MED ORDER — LEVOFLOXACIN IN D5W 750 MG/150ML IV SOLN: 750.0000 mg | INTRAVENOUS | Status: DC

## 2017-12-30 MED ORDER — LEVOFLOXACIN 500 MG PO TABS: 500.0000 mg | ORAL_TABLET | Freq: Every day | ORAL | 0 refills | Status: DC

## 2017-12-30 NOTE — Discharge Summary (Signed)
Physician Discharge Summary  Joan [name]:[id number] DOB: 03/07/1960 DOA: 12/28/2017  PCP: Joan Koch, MD  Admit date: 12/28/2017 Discharge date: 12/30/2017  Admitted From: home Disposition:  Home   Recommendations for Outpatient Follow-up:  1. Follow up with PCP in 1-2 weeks 2. Continue Levaquin for 5 additional days 3. Continue Medrol [place]: RN Equipment/Devices: Home O2, chronic  Discharge Condition: Stable CODE STATUS: Full code Diet recommendation: Regular  HPI: Per Dr. Glyn [name], Joan [name] is a 57 y.o. female with history of COPD with ongoing tobacco abuse, sleep apnea, diabetes mellitus type 2 secondary to steroids, hypertension, chronic pain and spasms presents to the ER with complaints of shortness of breath.  Patient has been having shortness of breath for last week and a half and has been progressively getting worse with wheezing.  Symptoms are mostly exertional denies any chest pain.  Denies any fever chills nausea vomiting abdominal pain denies any sick contacts. ED Course: In the ER on exam patient has diffuse wheezing.  Chest x-ray shows COPD features.  EKG shows normal sinus rhythm with RBBB.  Patient was given nebulizer treatment and steroid despite which patient was still wheezing and admitted for further management of COPD exacerbation.  Hospital Course: COPD exacerbation with acute on chronic hypoxic respiratory failure -patient was admitted to the hospital with acute on chronic hypoxic respiratory failure in the setting of COPD exacerbation.  She was placed on steroids, nebulizers as well as Levaquin.  With supportive treatment her respiratory status improved and she feels back to baseline.  She is able to ambulate in the hallway with oxygen, is having intermittent coughing spells but feels stronger than on admission and asking to go home.  She will be given a steroid taper with Medrol (she cannot tolerate prednisone) as well as 5  additional days of Levaquin. Hypertension -resume home medications on discharge Type 2 diabetes mellitus -resume home medications on discharge OSA -she is on CPAP Hyperlipidemia -continue statins Hypothyroidism -continue Synthroid Mild acute kidney injury -creatinine stable Chronic pain /depression -continue home medications Tobacco abuse -strongly counseled for cessation  Discharge Diagnoses:  Principal Problem:   Acute respiratory failure with hypoxia ([place]) Active Problems:   Essential hypertension   Prediabetes   Hypothyroidism due to acquired atrophy of thyroid   OSA on CPAP   COPD exacerbation (HCC)   Hypomagnesemia   COPD with acute exacerbation ([place])     Discharge Instructions   Allergies as of 12/30/2017      Reactions   Keflex [cephalexin] Other (See Comments)   unspecified   Prednisone Other (See Comments)   "counter reacts"   Shellfish Allergy Other (See Comments)   Stomach cramps   Tetracyclines & Related Other (See Comments)   unspecified   Dilaudid [hydromorphone Hcl] Other (See Comments)   "Lethargy"   Incruse Ellipta [umeclidinium Bromide] Nausea Only   Tuberculin Tests Other (See Comments)      Medication List    TAKE these medications   albuterol (2.5 MG/3ML) 0.083% nebulizer solution Commonly known as:  PROVENTIL Take 3 mLs (2.5 mg total) by nebulization 2 (two) times daily. And every 6 hours as needed   albuterol 108 (90 Base) MCG/ACT inhaler Commonly known as:  PROVENTIL HFA;VENTOLIN HFA Inhale 1-2 puffs into the lungs every 6 (six) hours as needed for wheezing or shortness of breath.   atenolol 25 MG tablet Commonly known as:  TENORMIN Take 1 tablet (25 mg total) by mouth daily.  atorvastatin 20 MG tablet Commonly known as:  LIPITOR Take 1 tablet (20 mg total) by mouth daily.   baclofen 10 MG tablet Commonly known as:  LIORESAL Take 1 tablet (10 mg total) by mouth at bedtime.   betamethasone valerate ointment 0.1 % Commonly  known as:  VALISONE Apply 1 application topically 2 (two) times daily.   diphenhydrAMINE 25 MG tablet Commonly known as:  BENADRYL Take 50 mg by mouth daily.   guaiFENesin-codeine 100-10 MG/5ML syrup Take 5 mLs by mouth every 4 (four) hours as needed for cough.   hydrochlorothiazide 25 MG tablet Commonly known as:  HYDRODIURIL Take 1 tablet (25 mg total) by mouth daily.   Ipratropium-Albuterol 20-100 MCG/ACT Aers respimat Commonly known as:  COMBIVENT Inhale 1 puff into the lungs every 6 (six) hours as needed for wheezing.   levofloxacin 500 MG tablet Commonly known as:  LEVAQUIN Take 1 tablet (500 mg total) by mouth daily.   levothyroxine 25 MCG tablet Commonly known as:  SYNTHROID, LEVOTHROID Take 1 tablet (25 mcg total) by mouth daily before breakfast.   Melatonin 5 MG Caps Take 1 capsule (5 mg total) by mouth at bedtime.   methocarbamol 500 MG tablet Commonly known as:  ROBAXIN Take 500 mg by mouth at bedtime.   methylPREDNISolone 4 MG Tbpk tablet Commonly known as:  MEDROL DOSEPAK Day 1: 24 mg on day 1 administered as 8 mg (2 tablets) before breakfast, 4 mg (1 tablet) after lunch, 4 mg (1 tablet) after supper, and 8 mg (2 tablets) at bedtime  Day 2: 20 mg on day 2 administered as 4 mg (1 tablet) before breakfast, 4 mg (1 tablet) after lunch, 4 mg (1 tablet) after supper, and 8 mg (2 tablets) at bedtime. Day 3: 16 mg on day 3 administered as 4 mg (1 tablet) before breakfast, 4 mg (1 tablet) after lunch, 4 mg (1 tablet) after supper, and 4 mg (1 tablet) at bedtime. Day 4: 12 mg on day 4 administered as 4 mg (1 tablet) before breakfast, 4 mg (1 tablet) after lunch, and 4 mg (1 tablet) at bedtime. Day 5: 8 mg on day 5 administered as 4 mg (1 tablet) before breakfast and 4 mg (1 tablet) at bedtime. Day 6: 4 mg on day 6 administered as 4 mg (1 tablet) before breakfast.   montelukast 10 MG tablet Commonly known as:  SINGULAIR Take 1 tablet (10 mg total) by mouth at  bedtime.   naproxen 500 MG tablet Commonly known as:  NAPROSYN Take 1,000 mg by mouth 2 (two) times daily as needed for mild pain.   nystatin-triamcinolone ointment Commonly known as:  MYCOLOG Apply 1 application topically 2 (two) times daily as needed (applied to skin folds/groins/under breast skin irritation rash.).   pantoprazole 40 MG tablet Commonly known as:  PROTONIX Take 1 tablet (40 mg total) by mouth daily. What changed:  when to take this   pregabalin 75 MG capsule Commonly known as:  LYRICA Take 1 capsule (75 mg total) by mouth 2 (two) times daily.   roflumilast 500 MCG Tabs tablet Commonly known as:  DALIRESP Take 1 tablet (500 mcg total) by mouth daily.   SPIRIVA RESPIMAT 2.5 MCG/ACT Aers Generic drug:  Tiotropium Bromide Monohydrate Inhale 2 puffs into the lungs every morning.   SYMBICORT 160-4.5 MCG/ACT inhaler Generic drug:  budesonide-formoterol Inhale 2 puffs into the lungs 2 (two) times daily.   traZODone 100 MG tablet Commonly known as:  DESYREL Take 1 tablet (  100 mg total) by mouth at bedtime.   valACYclovir 500 MG tablet Commonly known as:  VALTREX Take 1 tablet (500 mg total) by mouth daily.   venlafaxine XR 150 MG 24 hr capsule Commonly known as:  EFFEXOR-XR TAKE 1 CAPSULE DAILY WITH  BREAKFAST What changed:    how much to take  how to take this  when to take this  additional instructions      Follow-up Information    Joan Koch, MD. Schedule an appointment as soon as possible for a visit in 1 week(s).   Specialty:  Internal Medicine Contact information: [place] [address] [phone]        Care, [facility] Follow up.   Why:  HHRN for disease management Contact information: [place] [address] [phone]           Consultations:  None  Procedures/Studies:  Dg Chest 2 View  Result Date: 12/28/2017 CLINICAL DATA:  Shortness of breath EXAM:  CHEST - 2 VIEW COMPARISON:  11/28/2017 FINDINGS: Cardiac shadow is within normal limits. The lungs are hyperinflated similar to that seen on prior exam. No acute infiltrate or sizable effusion is seen bony abnormality is noted IMPRESSION: COPD without acute abnormality. Electronically Signed   By: Inez Catalina M.D.   On: 12/28/2017 20:41      Subjective: - no chest pain, chronic shortness of breath which is at baseline, no abdominal pain, nausea or vomiting.    Discharge Exam: Vitals:   12/30/17 0757 12/30/17 1123  BP: (!) 102/57   Pulse: 67   Resp:    Temp: (!) 97.2 F (36.2 C)   SpO2: 96% 94%    General: Pt is alert, awake, not in acute distress Cardiovascular: RRR, S1/S2 +, no rubs, no gallops Respiratory: Slight end expiratory wheezing Abdominal: Soft, NT, ND, bowel sounds + Extremities: no edema, no cyanosis    The results of significant diagnostics from this hospitalization (including imaging, microbiology, ancillary and laboratory) are listed below for reference.     Microbiology: Recent Results (from the past 240 hour(s))  MRSA PCR Screening     Status: None   Collection Time: 12/29/17  2:07 AM  Result Value Ref Range Status   MRSA by PCR NEGATIVE NEGATIVE Final    Comment:        The GeneXpert MRSA Assay (FDA approved for NASAL specimens only), is one component of a comprehensive MRSA colonization surveillance program. It is not intended to diagnose MRSA infection nor to guide or monitor treatment for MRSA infections. Performed at [facility], [place] [address], [place], [place] [address]      Labs: BNP (last 3 results) Recent Labs    12/29/17 0609  BNP 144.3*   Basic Metabolic Panel: Recent Labs  Lab 12/28/17 2008 12/29/17 0609 12/30/17 0233  NA 141 140 140  K 3.6 3.4* 4.5  CL 105 106 104  CO2 26 24 32  GLUCOSE 143* 362* 203*  BUN 18 20 24*  CREATININE 1.36* 1.31* 1.23*  CALCIUM 8.8* 8.3* 9.0  MG  --  1.6*  --    Liver Function  Tests: Recent Labs  Lab 12/29/17 0609  AST 24  ALT 19  ALKPHOS 112  BILITOT 0.5  PROT 6.3*  ALBUMIN 3.4*   No results for input(s): LIPASE, AMYLASE in the last 168 hours. No results for input(s): AMMONIA in the last 168 hours. CBC: Recent Labs  Lab 12/28/17  2008 12/29/17 0609 12/30/17 0233  WBC 19.2* 13.9* 17.3*  HGB 11.7* 11.1* 10.6*  HCT 37.9 35.2* 34.7*  MCV 95.5 95.1 95.1  PLT 361 273 282   Cardiac Enzymes: Recent Labs  Lab 12/29/17 0609  TROPONINI <0.03   BNP: Invalid input(s): POCBNP CBG: Recent Labs  Lab 12/29/17 1115 12/29/17 1654 12/29/17 2126 12/30/17 0725 12/30/17 1137  GLUCAP 381* 118* 233* 137* 269*   D-Dimer No results for input(s): DDIMER in the last 72 hours. Hgb A1c No results for input(s): HGBA1C in the last 72 hours. Lipid Profile No results for input(s): CHOL, HDL, LDLCALC, TRIG, CHOLHDL, LDLDIRECT in the last 72 hours. Thyroid function studies Recent Labs    12/29/17 0609  TSH 0.251*   Anemia work up No results for input(s): VITAMINB12, FOLATE, FERRITIN, TIBC, IRON, RETICCTPCT in the last 72 hours. Urinalysis    Component Value Date/Time   COLORURINE YELLOW 08/05/2017 [place] 08/05/2017 1652   LABSPEC 1.025 08/05/2017 1652   PHURINE 5.0 08/05/2017 1652   GLUCOSEU NEGATIVE 08/05/2017 1652   HGBUR TRACE-INTACT (A) 08/05/2017 1652   BILIRUBINUR NEGATIVE 08/05/2017 1652   KETONESUR NEGATIVE 08/05/2017 1652   PROTEINUR NEGATIVE 06/02/2017 2258   UROBILINOGEN 0.2 08/05/2017 1652   NITRITE NEGATIVE 08/05/2017 1652   LEUKOCYTESUR NEGATIVE 08/05/2017 1652   Sepsis Labs Invalid input(s): PROCALCITONIN,  WBC,  LACTICIDVEN   Time coordinating discharge: 35 minutes  SIGNED:  Marzetta Board, MD  [facility] 12/30/2017, 1:55 PM Pager [phone]  If 7PM-7AM, please contact night-coverage [email or web address] Password [email or web address]

## 2017-12-30 NOTE — Care Management Note (Signed)
Case Management Note  Patient Details  Name: Joan [name] MRN: [id number] Date of Birth: Jun 08, 1960  Subjective/Objective:    From home with her brother, presents with acute resp failure with hypoxia, copd ex. She has home oxygen with [facility] (2 liters), she also has a nebulizer machine at home, she has a PCP, and medication coverage.  She states she has had [place] services in the past with [facility] and if she needs [facility] services she would like to use them again. Patient is for dc today, NCM made referral to [facility] with [facility] for [facility] for disease management of copd. Soc will began tomorrow per Malachy Mood                Action/Plan: DC home when ready.  Expected Discharge Date:  12/30/17               Expected Discharge Plan:  [place]  In-House Referral:     Discharge planning Services  CM Consult  Post Acute Care Choice:  Home Health Choice offered to:  Patient  DME Arranged:    DME Agency:     HH Arranged:  RN, Disease Management [place] Agency:  [place]  Status of Service:  Completed, signed off  If discussed at [place] of Stay Meetings, dates discussed:    Additional Comments:  Zenon Mayo, RN 12/30/2017, 12:23 PM

## 2017-12-30 NOTE — Progress Notes (Signed)
Inpatient Diabetes Program Recommendations  AACE/ADA: New Consensus Statement on Inpatient Glycemic Control (2015)  Target Ranges:  Prepandial:   less than 140 mg/dL      Peak postprandial:   less than 180 mg/dL (1-2 hours)      Critically ill patients:  140 - 180 mg/dL   Results for Joan [name], Joan [name] (MRN [id number]) as of 12/30/2017 11:22  Ref. Range 12/29/2017 07:16 12/29/2017 11:15 12/29/2017 16:54 12/29/2017 21:26 12/30/2017 07:25  Glucose-Capillary Latest Ref Range: 70 - 99 mg/dL 345 (H) 381 (H) 118 (H) 233 (H) 137 (H)   Results for Joan [name], Joan [name] (MRN [id number]) as of 12/30/2017 11:22  Ref. Range 09/24/2016 21:18 02/03/2017 12:01 06/11/2017 12:32  Hemoglobin A1C Latest Ref Range: 4.8 - 5.6 % 5.9 (H) 6.0 (H) 6.1 (H)   Review of Glycemic Control  Diabetes history: DM2 (steroid induced per H&P) Outpatient Diabetes medications: Per H&P, patient reports she takes Metformin but not noted on home medication list Current orders for Inpatient glycemic control: Levemir 10 units daily, Novolog 0-15 units TID with meals, Novolog 0-5 units QHS; Solumedrol 40 mg Q12H  Inpatient Diabetes Program Recommendations:  Insulin - Basal: Noted Levemir started on 12/29/17. Diet: Please consider discontinuing Regular diet and ordering Carb Modified diet. HbgA1C: Please consider ordering an A1C to evaluate glycemic control over the past 2-3 months.  Thanks, Barnie Alderman, RN, MSN, CDE Diabetes Coordinator Inpatient Diabetes Program [phone] (Team Pager from 8am to 5pm)

## 2017-12-30 NOTE — Care Management Note (Addendum)
Case Management Note  Patient Details  Name: Joan [name] MRN: [id number] Date of Birth: 1960-08-07  Subjective/Objective:   From home with her brother, presents with acute resp failure with hypoxia, copd ex. She has home oxygen with [facility] (2 liters), she also has a nebulizer machine at home, she has a PCP, and medication coverage.  She states she has had [place] services in the past with [facility] and if she needs [facility] services she would like to use them again. Patient is for dc today, NCM made referral to [facility] with [facility] for [facility] for disease management of copd. Soc will began tomorrow per Malachy Mood.                     Action/Plan: DC home when ready.  Expected Discharge Date:                  Expected Discharge Plan:  Home/Self Care  In-House Referral:     Discharge planning Services     Post Acute Care Choice:    Choice offered to:     DME Arranged:    DME Agency:     HH Arranged:   RN [place] Agency:   [place]  Status of Service:  In process, will continue to follow  If discussed at Long Length of Stay Meetings, dates discussed:    Additional Comments:  Zenon Mayo, RN 12/30/2017, 10:07 AM

## 2017-12-31 ENCOUNTER — Telehealth: Payer: Self-pay | Admitting: *Deleted

## 2017-12-31 ENCOUNTER — Ambulatory Visit (INDEPENDENT_AMBULATORY_CARE_PROVIDER_SITE_OTHER): Payer: Medicare HMO

## 2017-12-31 ENCOUNTER — Other Ambulatory Visit: Payer: Self-pay | Admitting: Podiatry

## 2017-12-31 ENCOUNTER — Encounter: Payer: Self-pay | Admitting: Podiatry

## 2017-12-31 ENCOUNTER — Ambulatory Visit: Payer: Medicare HMO | Admitting: Podiatry

## 2017-12-31 VITALS — BP 100/61 | HR 72

## 2017-12-31 DIAGNOSIS — E0843 Diabetes mellitus due to underlying condition with diabetic autonomic (poly)neuropathy: Secondary | ICD-10-CM

## 2017-12-31 DIAGNOSIS — R252 Cramp and spasm: Secondary | ICD-10-CM

## 2017-12-31 DIAGNOSIS — M79672 Pain in left foot: Principal | ICD-10-CM

## 2017-12-31 DIAGNOSIS — M79671 Pain in right foot: Secondary | ICD-10-CM

## 2017-12-31 MED ORDER — CYCLOBENZAPRINE HCL 5 MG PO TABS: 5.0000 mg | ORAL_TABLET | Freq: Every day | ORAL | 1 refills | Status: DC

## 2017-12-31 NOTE — Consult Note (Signed)
[facility] CM Primary Care Navigator  12/31/2017  Joan [name] [birth year] [id number]   Attempt to see patient at the bedside to identify possible discharge needs butshewas already discharged. Per chart review, patient was discharged home with home health services. . Per MD note,patient presented with complaints of worsening shortness of breath with wheezing, and admitted for further management of COPD exacerbation.   Patient has discharge instruction to follow-up withprimary care provider in 1 week.  Primary care provider's office is listed as providing transition of care (TOC) follow-up.   For additional questions please contact:  Karin Golden A. Lazarius Rivkin, BSN, RN-BC [facility] PRIMARY CARE Navigator Cell: [phone]

## 2017-12-31 NOTE — Telephone Encounter (Signed)
Transition Care Management Follow-up Telephone Call   Date discharged? 12/30/17   How have you been since you were released from the hospital? Pt states she is alright still not feeling 100% better, but still taking antibiotics that was given   Do you understand why you were in the hospital? YES   Do you understand the discharge instructions? YES   Where were you discharged to? Home   Items Reviewed:  Medications reviewed: YES  Allergies reviewed: YES  Dietary changes reviewed: YES  Referrals reviewed: No referral needed   Functional Questionnaire:   Activities of Daily Living (ADLs):   She states she are independent in the following: ambulation, bathing and hygiene, feeding, continence, grooming, toileting and dressing States she doesn't require assistance    Any transportation issues/concerns?: NO   Any patient concerns? NO   Confirmed importance and date/time of follow-up visits scheduled YES, appt 01/06/18  Provider Appointment booked with Dr. Sharlet Salina  Confirmed with patient if condition begins to worsen call PCP or go to the ER.  Patient was given the office number and encouraged to call back with question or concerns.  : YES

## 2018-01-01 ENCOUNTER — Encounter: Payer: Self-pay | Admitting: Gastroenterology

## 2018-01-01 ENCOUNTER — Ambulatory Visit: Payer: Medicare HMO | Admitting: Gastroenterology

## 2018-01-01 ENCOUNTER — Encounter: Payer: Self-pay | Admitting: Internal Medicine

## 2018-01-01 ENCOUNTER — Other Ambulatory Visit: Payer: Self-pay | Admitting: Acute Care

## 2018-01-01 VITALS — BP 120/78 | HR 72 | Ht 68.0 in | Wt 220.1 lb

## 2018-01-01 DIAGNOSIS — E039 Hypothyroidism, unspecified: Secondary | ICD-10-CM | POA: Diagnosis not present

## 2018-01-01 DIAGNOSIS — I10 Essential (primary) hypertension: Secondary | ICD-10-CM | POA: Diagnosis not present

## 2018-01-01 DIAGNOSIS — R198 Other specified symptoms and signs involving the digestive system and abdomen: Secondary | ICD-10-CM | POA: Diagnosis not present

## 2018-01-01 DIAGNOSIS — G4733 Obstructive sleep apnea (adult) (pediatric): Secondary | ICD-10-CM | POA: Diagnosis not present

## 2018-01-01 DIAGNOSIS — E119 Type 2 diabetes mellitus without complications: Secondary | ICD-10-CM | POA: Diagnosis not present

## 2018-01-01 DIAGNOSIS — Z9981 Dependence on supplemental oxygen: Secondary | ICD-10-CM | POA: Diagnosis not present

## 2018-01-01 DIAGNOSIS — R142 Eructation: Secondary | ICD-10-CM | POA: Diagnosis not present

## 2018-01-01 DIAGNOSIS — J441 Chronic obstructive pulmonary disease with (acute) exacerbation: Secondary | ICD-10-CM | POA: Diagnosis not present

## 2018-01-01 DIAGNOSIS — E785 Hyperlipidemia, unspecified: Secondary | ICD-10-CM | POA: Diagnosis not present

## 2018-01-01 DIAGNOSIS — G8929 Other chronic pain: Secondary | ICD-10-CM | POA: Diagnosis not present

## 2018-01-01 DIAGNOSIS — R69 Illness, unspecified: Secondary | ICD-10-CM | POA: Diagnosis not present

## 2018-01-01 DIAGNOSIS — R140 Abdominal distension (gaseous): Secondary | ICD-10-CM

## 2018-01-01 DIAGNOSIS — J9621 Acute and chronic respiratory failure with hypoxia: Secondary | ICD-10-CM | POA: Diagnosis not present

## 2018-01-01 NOTE — Patient Instructions (Addendum)
If you are age 56 or older, your body mass index should be between 23-30. Your Body mass index is 33.47 kg/m. If this is out of the aforementioned range listed, please consider follow up with your Primary Care Provider.  If you are age 70 or younger, your body mass index should be between 19-25. Your Body mass index is 33.47 kg/m. If this is out of the aformentioned range listed, please consider follow up with your Primary Care Provider.   It was a pleasure to see you today!  Dr. Loletha Carrow   Food Guidelines for gas and bloating  Many people have difficulty digesting certain foods, causing a variety of distressing and embarrassing symptoms such as abdominal pain, bloating and gas.  These foods may need to be avoided or consumed in small amounts.  Here are some tips that might be helpful for you.  1.   Lactose intolerance is the difficulty or complete inability to digest lactose, the natural sugar in milk and anything made from milk.  This condition is harmless, common, and can begin any time during life.  Some people can digest a modest amount of lactose while others cannot tolerate any.  Also, not all dairy products contain equal amounts of lactose.  For example, hard cheeses such as parmesan have less lactose than soft cheeses such as cheddar.  Yogurt has less lactose than milk or cheese.  Many packaged foods (even many brands of bread) have milk, so read ingredient lists carefully.  It is difficult to test for lactose intolerance, so just try avoiding lactose as much as possible for a week and see what happens with your symptoms.  If you seem to be lactose intolerant, the best plan is to avoid it (but make sure you get calcium from another source).  The next best thing is to use lactase enzyme supplements, available over the counter everywhere.  Just know that many lactose intolerant people need to take several tablets with each serving of dairy to avoid symptoms.  Lastly, a lot of restaurant food is  made with milk or butter.  Many are things you might not suspect, such as mashed potatoes, rice and pasta (cooked with butter) and "grilled" items.  If you are lactose intolerant, it never hurts to ask your server what has milk or butter.  2.   Fiber is an important part of your diet, but not all fiber is well-tolerated.  Insoluble fiber such as bran is often consumed by normal gut bacteria and converted into gas.  Soluble fiber such as oats, squash, carrots and green beans are typically tolerated better.  3.   Some types of carbohydrates can be poorly digested.  Examples include: fructose (apples, cherries, pears, raisins and other dried fruits), fructans (onions, zucchini, large amounts of wheat), sorbitol/mannitol/xylitol and sucralose/Splenda (common artificial sweeteners), and raffinose (lentils, broccoli, cabbage, asparagus, brussel sprouts, many types of beans).  Do a Development worker, community for [employer] and you will find helpful information. Beano, a dietary supplement, will often help with raffinose-containing foods.  As with lactase tablets, you may need several per serving.  4.   Whenever possible, avoid processed food&meats and chemical additives.  High fructose corn syrup, a common sweetener, may be difficult to digest.  Eggs and soy (comes from the soybean, and added to many foods now) are other common bloating/gassy foods.  - Dr. Herma Ard [facility]

## 2018-01-01 NOTE — Progress Notes (Signed)
[facility] Consult Note:  History: Joan [name] 01/01/2018  Referring physician: [name], MD  Reason for consult/chief complaint: Gas (belching and flatulence); Increased Urgency (to have BM. Only small amount comes out though. Denies bleeding.); and Abdominal Pain (cramping)   Subjective  HPI:  This is a very pleasant 57 year old woman with chronic complaints of belching and flatulence but no abdominal pain and change in bowel habits.  She reports having been seen by GI physician in [place] 5 to 7 years ago, and had an upper endoscopy and colonoscopy for similar symptoms.  She has frequent belching and gas that she finds particularly embarrassing.  She feels bloated and has increased flatulence.  Her bowel habits were previously regular, but for the last couple of years she will have some feelings of lower abdominal pressure, need for a bowel movement, then just small pellets will pass.  She denies rectal bleeding or weight loss.  She has severe COPD and a recent admission for this.  From Dr. Gertie Gowda recent ENT consult note: "Joan [name] is a 57 y.o. female who presents as a consultation at the request of Dr. Sharlet Salina with a chief complaint of hoarseness. She has a long history of voice problems. Hoarseness comes and goes and can be severe. She has been told she has a cracked larynx by her ENT in [place]. He was watching for concerns about cancer. She has required about three laryngeal surgeries over the years. No cancer has been found. Her voice improves after her surgeries. Now, her voice has been worse again for the past couple of months. She denies throat pain. There is some difficulty swallowing bulkier foods. She still smokes about 1 ppwk. She has tried to quit many times. She has smoked for over 40 years. She notices pain and swelling in the right neck for the past few years. This limits her range of motion. " " After completion,  the telescope was removed. Findings included normal nasal passages, no mass or abnormality in the nasopharynx, and no mass or ulceration in the pharynx or larynx. Pyriform sinuses are open. Secretions are minimal. Vocal folds both have thickened mucosa without discrete lesion but with poor vibration. The vocal folds adduct and abduct symmetrically. There is good glottal closure. Muscle tension patterns are not present. Laryngeal edema is moderate with posterior commissure hypertrophy with some mild crusting.  Impression & Plans:  Joan [name] is a 57 y.o. female with dysphonia.  - Fiberoptic exam of her larynx does not demonstrate any concerning lesions. She was reassured. However, the mucosa of the vocal folds is chronically irritated and thickened, resulting in poor vibration. This is likely a consequence of smoking and perhaps other irritants. I recommended smoking cessation and to continue anti-reflux medication. I do not see a role in surgical intervention at this time. She will follow-up in six months, or sooner if needed. "   This patient was admitted to the hospital from 11 3-11 5 for COPD exacerbation, treated with Levaquin and prednisone.  ROS:  Review of Systems  Constitutional: Positive for fatigue. Negative for appetite change and unexpected weight change.  HENT: Negative for mouth sores and voice change.   Eyes: Negative for pain and redness.  Respiratory: Positive for cough and shortness of breath.   Cardiovascular: Negative for chest pain and palpitations.  Genitourinary: Negative for dysuria and hematuria.  Musculoskeletal: Positive for arthralgias and back pain. Negative for myalgias.  Skin: Negative for pallor and rash.  Neurological:  Negative for weakness and headaches.  Hematological: Negative for adenopathy.     Past Medical History: Past Medical History:  Diagnosis Date  . Acute respiratory failure with hypoxia ([place])   . Alcoholism and drug addiction in family   .  Anxiety   . Arthritis    "knees, hips, lower back" (09/25/2016)  . Asthma   . Bipolar disorder ([place])   . Chronic bronchitis ([place])    "all the time" (09/25/2016)  . Chronic leg pain    RLE  . Chronic lower back pain   . COPD (chronic obstructive pulmonary disease) ([place])   . Depression   . Emphysema of lung ([place])   . GERD (gastroesophageal reflux disease)   . Headache   . Hepatitis C    "treated back in 2001-2002"  . High cholesterol   . Hypertension   . Hypothyroidism   . Incarcerated ventral hernia 04/04/2017  . On home oxygen therapy    "2L; at nighttime" (04/04/2017)  . OSA on CPAP   . Pneumonia    "all the time" (09/25/2016)  . Positive PPD    with negative chest x ray  . Tachycardia    patient reports with exertion ; denies any other conditions   . Thyroid disease      Past Surgical History: Past Surgical History:  Procedure Laterality Date  . BACK SURGERY    . CARDIAC CATHETERIZATION    . COLONOSCOPY    . CYST EXCISION     removal of cyst in sinuses  . DILATION AND CURETTAGE OF UTERUS    . FOOT SURGERY Bilateral    bone removed from 4th and 5 th toes and put in pin then took pin out  . HERNIA REPAIR    . INCISION AND DRAINAGE  ~ 2014/2015   "removed mesh & infection"  . INCISIONAL HERNIA REPAIR N/A 04/04/2017   Procedure: LAPAROSCOPIC INCISIONAL HERNIA REPAIR WITH MESH;  Surgeon: Fanny Skates, MD;  Location: [facility];  Service: General;  Laterality: N/A;  . LACERATION REPAIR Right    repair wrist artery from laceration from ice skate  . LAPAROSCOPIC INCISIONAL / UMBILICAL / VENTRAL HERNIA REPAIR  04/04/2017   LAPAROSCOPIC INCISIONAL HERNIA REPAIR WITH MESH  . LIPOMA EXCISION Right    fattty lipoma in neck  . NASAL SEPTUM SURGERY    . POSTERIOR LUMBAR FUSION  2015/2016 X 2   "added rods and screws"  . TOTAL HIP ARTHROPLASTY Right 02/05/2017   Procedure: RIGHT TOTAL HIP ARTHROPLASTY;  Surgeon: [name], MD;  Location: [facility];  Service: Orthopedics;   Laterality: Right;  . TOTAL HIP ARTHROPLASTY Left 06/18/2017   Procedure: LEFT TOTAL HIP ARTHROPLASTY;  Surgeon: [name], MD;  Location: [facility];  Service: Orthopedics;  Laterality: Left;  . TUBAL LIGATION    . UMBILICAL HERNIA REPAIR  ~ 2013   w/mesh  . UPPER GI ENDOSCOPY       Family History: Family History  Problem Relation Age of Onset  . Diabetes Mother   . Hypertension Mother   . Hypertension Father   . Cancer Father        lung    Social History: Social History   Socioeconomic History  . Marital status: Widowed    Spouse name: Not on file  . Number of children: Not on file  . Years of education: Not on file  . Highest education level: Not on file  Occupational History  . Not on file  Social Needs  .  Financial resource strain: Somewhat hard  . Food insecurity:    Worry: Never true    Inability: Never true  . Transportation needs:    Medical: No    Non-medical: No  Tobacco Use  . Smoking status: Former Smoker    Packs/day: 1.00    Years: 44.00    Pack years: 44.00    Types: Cigarettes    Last attempt to quit: 09/29/2017    Years since quitting: 0.2  . Smokeless tobacco: Never Used  Substance and Sexual Activity  . Alcohol use: Yes  . Drug use: Yes    Types: "Crack" cocaine    Comment: 04/04/2017 "nothing since 01/12/2012"  . Sexual activity: Never  Lifestyle  . Physical activity:    Days per week: 3 days    Minutes per session: 30 min  . Stress: Very much  Relationships  . Social connections:    Talks on phone: Three times a week    Gets together: Once a week    Attends religious service: Never    Active member of club or organization: No    Attends meetings of clubs or organizations: Never    Relationship status: Widowed  Other Topics Concern  . Not on file  Social History Narrative  . Not on file    Allergies: Allergies  Allergen Reactions  . Keflex [Cephalexin] Other (See Comments)    unspecified  . Prednisone Other (See  Comments)    "counter reacts"  . Shellfish Allergy Other (See Comments)    Stomach cramps  . Tetracyclines & Related Other (See Comments)    unspecified  . Dilaudid [Hydromorphone Hcl] Other (See Comments)    "Lethargy"  . Incruse Ellipta [Umeclidinium Bromide] Nausea Only  . Tuberculin Tests Other (See Comments)    Outpatient Meds: Current Outpatient Medications  Medication Sig Dispense Refill  . albuterol (PROAIR HFA) 108 (90 Base) MCG/ACT inhaler Inhale 1-2 puffs into the lungs every 6 (six) hours as needed for wheezing or shortness of breath. 3 Inhaler 1  . albuterol (PROVENTIL) (2.5 MG/3ML) 0.083% nebulizer solution Take 3 mLs (2.5 mg total) by nebulization 2 (two) times daily. And every 6 hours as needed 75 mL 3  . atenolol (TENORMIN) 25 MG tablet Take 1 tablet (25 mg total) by mouth daily. 90 tablet 3  . atorvastatin (LIPITOR) 20 MG tablet Take 1 tablet (20 mg total) by mouth daily. 90 tablet 3  . baclofen (LIORESAL) 10 MG tablet Take 1 tablet (10 mg total) by mouth at bedtime. 90 tablet 3  . betamethasone valerate ointment (VALISONE) 0.1 % Apply 1 application topically 2 (two) times daily. 100 g 0  . cyclobenzaprine (FLEXERIL) 5 MG tablet Take 1 tablet (5 mg total) by mouth at bedtime. 30 tablet 1  . diphenhydrAMINE (BENADRYL) 25 MG tablet Take 50 mg by mouth daily.    Marland Kitchen guaiFENesin-codeine 100-10 MG/5ML syrup Take 5 mLs by mouth every 4 (four) hours as needed for cough. 180 mL 0  . hydrochlorothiazide (HYDRODIURIL) 25 MG tablet Take 1 tablet (25 mg total) by mouth daily. 90 tablet 3  . Ipratropium-Albuterol (COMBIVENT RESPIMAT) 20-100 MCG/ACT AERS respimat Inhale 1 puff into the lungs every 6 (six) hours as needed for wheezing. 3 Inhaler 3  . levofloxacin (LEVAQUIN) 500 MG tablet Take 1 tablet (500 mg total) by mouth daily. 5 tablet 0  . levothyroxine (SYNTHROID, LEVOTHROID) 25 MCG tablet Take 1 tablet (25 mcg total) by mouth daily before breakfast. 90 tablet 3  .  Melatonin 5 MG  CAPS Take 1 capsule (5 mg total) by mouth at bedtime. 30 capsule 0  . methocarbamol (ROBAXIN) 500 MG tablet Take 500 mg by mouth at bedtime.    . methylPREDNISolone (MEDROL DOSEPAK) 4 MG TBPK tablet Day 1: 24 mg on day 1 administered as 8 mg (2 tablets) before breakfast, 4 mg (1 tablet) after lunch, 4 mg (1 tablet) after supper, and 8 mg (2 tablets) at bedtime  Day 2: 20 mg on day 2 administered as 4 mg (1 tablet) before breakfast, 4 mg (1 tablet) after lunch, 4 mg (1 tablet) after supper, and 8 mg (2 tablets) at bedtime. Day 3: 16 mg on day 3 administered as 4 mg (1 tablet) before breakfast, 4 mg (1 tablet) after lunch, 4 mg (1 tablet) after supper, and 4 mg (1 tablet) at bedtime. Day 4: 12 mg on day 4 administered as 4 mg (1 tablet) before breakfast, 4 mg (1 tablet) after lunch, and 4 mg (1 tablet) at bedtime. Day 5: 8 mg on day 5 administered as 4 mg (1 tablet) before breakfast and 4 mg (1 tablet) at bedtime. Day 6: 4 mg on day 6 administered as 4 mg (1 tablet) before breakfast. 21 tablet 0  . montelukast (SINGULAIR) 10 MG tablet Take 1 tablet (10 mg total) by mouth at bedtime. 90 tablet 1  . naproxen (NAPROSYN) 500 MG tablet Take 1,000 mg by mouth 2 (two) times daily as needed for mild pain.     Marland Kitchen nystatin-triamcinolone ointment (MYCOLOG) Apply 1 application topically 2 (two) times daily as needed (applied to skin folds/groins/under breast skin irritation rash.). 100 g 3  . OXYGEN Inhale 2 L into the lungs daily.    . pantoprazole (PROTONIX) 40 MG tablet Take 1 tablet (40 mg total) by mouth daily. (Patient taking differently: Take 40 mg by mouth 2 (two) times daily. ) 90 tablet 3  . pregabalin (LYRICA) 75 MG capsule Take 1 capsule (75 mg total) by mouth 2 (two) times daily. 180 capsule 1  . roflumilast (DALIRESP) 500 MCG TABS tablet Take 1 tablet (500 mcg total) by mouth daily. 90 tablet 1  . SPIRIVA RESPIMAT 2.5 MCG/ACT AERS Inhale 2 puffs into the lungs every morning.    . SYMBICORT 160-4.5  MCG/ACT inhaler Inhale 2 puffs into the lungs 2 (two) times daily. 3 Inhaler 1  . traZODone (DESYREL) 100 MG tablet Take 1 tablet (100 mg total) by mouth at bedtime. 90 tablet 1  . valACYclovir (VALTREX) 500 MG tablet Take 1 tablet (500 mg total) by mouth daily. 90 tablet 3  . venlafaxine XR (EFFEXOR-XR) 150 MG 24 hr capsule TAKE 1 CAPSULE DAILY WITH  BREAKFAST (Patient taking differently: Take 150 mg by mouth daily with breakfast. ) 90 capsule 3   No current facility-administered medications for this visit.       ___________________________________________________________________ Objective   Exam:  BP 120/78   Pulse 72   Ht 5\' 8"  (1.727 m)   Wt 220 lb 2 oz (99.8 kg)   BMI 33.47 kg/m    General: this is a(n) chronically ill-appearing woman, pleasant and conversational, increased respiratory rate at rest, somewhat dyspneic with conversation.  Eyes: sclera anicteric, no redness  ENT: oral mucosa moist without lesions, no cervical or supraclavicular lymphadenopathy  CV: RRR without murmur, S1/S2, no JVD, no peripheral edema  Resp: clear to auscultation bilaterally, normal RR and effort noted  GI: soft, mild generalized tenderness patient of abdominal wall, with active  bowel sounds. No guarding or palpable organomegaly noted, limited by body habitus.  Skin; warm and dry, no rash or jaundice noted  Neuro: awake, alert and oriented x 3. Normal gross motor function and fluent speech  Labs:  CMP Latest Ref Rng & Units 12/30/2017 12/29/2017 12/28/2017  Glucose 70 - 99 mg/dL 203(H) 362(H) 143(H)  BUN 6 - 20 mg/dL 24(H) 20 18  Creatinine 0.44 - 1.00 mg/dL 1.23(H) 1.31(H) 1.36(H)  Sodium 135 - 145 mmol/L 140 140 141  Potassium 3.5 - 5.1 mmol/L 4.5 3.4(L) 3.6  Chloride 98 - 111 mmol/L 104 106 105  CO2 22 - 32 mmol/L 32 24 26  Calcium 8.9 - 10.3 mg/dL 9.0 8.3(L) 8.8(L)  Total Protein 6.5 - 8.1 g/dL - 6.3(L) -  Total Bilirubin 0.3 - 1.2 mg/dL - 0.5 -  Alkaline Phos 38 - 126 U/L -  112 -  AST 15 - 41 U/L - 24 -  ALT 0 - 44 U/L - 19 -   CBC Latest Ref Rng & Units 12/30/2017 12/29/2017 12/28/2017  WBC 4.0 - 10.5 K/uL 17.3(H) 13.9(H) 19.2(H)  Hemoglobin 12.0 - 15.0 g/dL 10.6(L) 11.1(L) 11.7(L)  Hematocrit 36.0 - 46.0 % 34.7(L) 35.2(L) 37.9  Platelets 150 - 400 K/uL 282 273 361   Recent TSH low at 0.251 and free T4 low end of normal at 0.84  Radiologic Studies:  Recent nonischemic nuclear medicine test.  Office note by Dr. Gwenlyn Found of cardiology on 11/05/2017 reviewed.  See CTAP 06/03/17 report.  Specifically, no obstruction.  At that time, she had a postoperative fluid collection around her abdominal mesh.  Assessment: Encounter Diagnoses  Name Primary?  . Abdominal bloating Yes  . Belching   . Change in bowel function     Bloating and belching that is combination of dietary/maldigestion and aerophagia from COPD/dyspnea and continued smoking. Change in bowel habits that sounds benign, possibly related to hypothyroidism.  Plan:  Written dietary advice given regarding bloating and gas Strongly counseled to quit smoking. She would like to start with stool softener, since this is worked well for her in the past.  If needed, MiraLAX can be added. I have no plans for endoscopic work-up at this point, as I feel the risks would outweigh the expected benefits. See me as needed.  Thank you for the courtesy of this consult.  Please call me with any questions or concerns.  Nelida Meuse III  CC: [name], MD

## 2018-01-02 DIAGNOSIS — G4733 Obstructive sleep apnea (adult) (pediatric): Secondary | ICD-10-CM | POA: Diagnosis not present

## 2018-01-02 DIAGNOSIS — J441 Chronic obstructive pulmonary disease with (acute) exacerbation: Secondary | ICD-10-CM | POA: Diagnosis not present

## 2018-01-02 DIAGNOSIS — J9611 Chronic respiratory failure with hypoxia: Secondary | ICD-10-CM | POA: Diagnosis not present

## 2018-01-02 DIAGNOSIS — J449 Chronic obstructive pulmonary disease, unspecified: Secondary | ICD-10-CM | POA: Diagnosis not present

## 2018-01-02 MED ORDER — BUPROPION HCL ER (XL) 150 MG PO TB24: 150.0000 mg | ORAL_TABLET | Freq: Every day | ORAL | 3 refills | Status: DC

## 2018-01-04 NOTE — Progress Notes (Signed)
   HPI: 57 year old female presenting today as a new patient with a chief complaint of burning pain in bilateral feet that has been ongoing for the past few years. She reports associated spasms that radiate up her lower extremities. She has been prescribed Lyrica by her PCP. She denies modifying factors. Patient is here for further evaluation and treatment.   Past Medical History:  Diagnosis Date  . Acute respiratory failure with hypoxia ([place])   . Alcoholism and drug addiction in family   . Anxiety   . Arthritis    "knees, hips, lower back" (09/25/2016)  . Asthma   . Bipolar disorder ([place])   . Chronic bronchitis ([place])    "all the time" (09/25/2016)  . Chronic leg pain    RLE  . Chronic lower back pain   . COPD (chronic obstructive pulmonary disease) ([place])   . Depression   . Emphysema of lung ([place])   . GERD (gastroesophageal reflux disease)   . Headache   . Hepatitis C    "treated back in 2001-2002"  . High cholesterol   . Hypertension   . Hypothyroidism   . Incarcerated ventral hernia 04/04/2017  . On home oxygen therapy    "2L; at nighttime" (04/04/2017)  . OSA on CPAP   . Pneumonia    "all the time" (09/25/2016)  . Positive PPD    with negative chest x ray  . Tachycardia    patient reports with exertion ; denies any other conditions   . Thyroid disease      Physical Exam: General: The patient is alert and oriented x3 in no acute distress.  Dermatology: Skin is warm, dry and supple bilateral lower extremities. Negative for open lesions or macerations.  Vascular: Palpable pedal pulses bilaterally. No edema or erythema noted. Capillary refill within normal limits.  Neurological: Epicritic and protective threshold diminished bilaterally.   Musculoskeletal Exam: Range of motion within normal limits to all pedal and ankle joints bilateral. Muscle strength 5/5 in all groups bilateral.   Radiographic Exam:  Normal osseous mineralization. Joint spaces preserved. No  fracture/dislocation/boney destruction.    Assessment: 1. Nocturnal foot cramps 2. DM with polyneuropathy - uncontrolled    Plan of Care:  1. Patient evaluated. X-Rays reviewed.  2. Continue taking Lyrica as directed by PCP.  3. Prescription for Flexeril 5 mg QHS as needed for nocturnal foot cramps.  4. Continue wearing good shoe gear.  5. Return to clinic in 8 weeks.       Edrick Kins, DPM [facility]  Dr. Edrick Kins, DPM    [address], [place] [address]                Office [phone]  Fax [phone]

## 2018-01-05 ENCOUNTER — Telehealth: Payer: Self-pay | Admitting: Internal Medicine

## 2018-01-05 DIAGNOSIS — E039 Hypothyroidism, unspecified: Secondary | ICD-10-CM | POA: Diagnosis not present

## 2018-01-05 DIAGNOSIS — R69 Illness, unspecified: Secondary | ICD-10-CM | POA: Diagnosis not present

## 2018-01-05 DIAGNOSIS — G4733 Obstructive sleep apnea (adult) (pediatric): Secondary | ICD-10-CM | POA: Diagnosis not present

## 2018-01-05 DIAGNOSIS — J9621 Acute and chronic respiratory failure with hypoxia: Secondary | ICD-10-CM | POA: Diagnosis not present

## 2018-01-05 DIAGNOSIS — J441 Chronic obstructive pulmonary disease with (acute) exacerbation: Secondary | ICD-10-CM | POA: Diagnosis not present

## 2018-01-05 DIAGNOSIS — E785 Hyperlipidemia, unspecified: Secondary | ICD-10-CM | POA: Diagnosis not present

## 2018-01-05 DIAGNOSIS — E119 Type 2 diabetes mellitus without complications: Secondary | ICD-10-CM | POA: Diagnosis not present

## 2018-01-05 DIAGNOSIS — Z9981 Dependence on supplemental oxygen: Secondary | ICD-10-CM | POA: Diagnosis not present

## 2018-01-05 DIAGNOSIS — G8929 Other chronic pain: Secondary | ICD-10-CM | POA: Diagnosis not present

## 2018-01-05 DIAGNOSIS — I10 Essential (primary) hypertension: Secondary | ICD-10-CM | POA: Diagnosis not present

## 2018-01-05 NOTE — Telephone Encounter (Signed)
Copied from [place] [id number]. Topic: Quick Communication - Home Health Verbal Orders >> Jan 05, 2018  3:57 PM Rutherford Nail, [place] wrote: Caller/Agency: Mickel Baas with [facility] Number: [phone] Requesting OT/PT/Skilled Nursing/Social Work: PT Frequency: 3x a week for 1 weeks 2x a week for 3 weeks

## 2018-01-06 ENCOUNTER — Encounter: Payer: Self-pay | Admitting: Internal Medicine

## 2018-01-06 ENCOUNTER — Ambulatory Visit (INDEPENDENT_AMBULATORY_CARE_PROVIDER_SITE_OTHER): Payer: Medicare HMO | Admitting: Internal Medicine

## 2018-01-06 VITALS — BP 122/60 | HR 74 | Temp 99.0°F | Ht 68.0 in | Wt 220.0 lb

## 2018-01-06 DIAGNOSIS — Z9981 Dependence on supplemental oxygen: Secondary | ICD-10-CM | POA: Diagnosis not present

## 2018-01-06 DIAGNOSIS — E039 Hypothyroidism, unspecified: Secondary | ICD-10-CM | POA: Diagnosis not present

## 2018-01-06 DIAGNOSIS — J9601 Acute respiratory failure with hypoxia: Secondary | ICD-10-CM | POA: Diagnosis not present

## 2018-01-06 DIAGNOSIS — J441 Chronic obstructive pulmonary disease with (acute) exacerbation: Secondary | ICD-10-CM

## 2018-01-06 DIAGNOSIS — R69 Illness, unspecified: Secondary | ICD-10-CM | POA: Diagnosis not present

## 2018-01-06 DIAGNOSIS — G4733 Obstructive sleep apnea (adult) (pediatric): Secondary | ICD-10-CM | POA: Diagnosis not present

## 2018-01-06 DIAGNOSIS — G8929 Other chronic pain: Secondary | ICD-10-CM | POA: Diagnosis not present

## 2018-01-06 DIAGNOSIS — J9621 Acute and chronic respiratory failure with hypoxia: Secondary | ICD-10-CM | POA: Diagnosis not present

## 2018-01-06 DIAGNOSIS — E785 Hyperlipidemia, unspecified: Secondary | ICD-10-CM | POA: Diagnosis not present

## 2018-01-06 DIAGNOSIS — I10 Essential (primary) hypertension: Secondary | ICD-10-CM | POA: Diagnosis not present

## 2018-01-06 DIAGNOSIS — E119 Type 2 diabetes mellitus without complications: Secondary | ICD-10-CM | POA: Diagnosis not present

## 2018-01-06 MED ORDER — NAPROXEN 500 MG PO TABS: 500.0000 mg | ORAL_TABLET | Freq: Two times a day (BID) | ORAL | 3 refills | Status: DC | PRN

## 2018-01-06 MED ORDER — BETAMETHASONE VALERATE 0.1 % EX OINT: 1.0000 "application " | TOPICAL_OINTMENT | Freq: Two times a day (BID) | CUTANEOUS | 0 refills | Status: DC

## 2018-01-06 NOTE — Telephone Encounter (Signed)
Notified Laura w/ MD response../lmb ?

## 2018-01-06 NOTE — Patient Instructions (Addendum)
We will have you try the wellbutrin to try to stop smoking.   Call us when you get back and we can check on the memory and also about the electric wheelchair.

## 2018-01-06 NOTE — Progress Notes (Signed)
   Subjective:    Patient ID: Joan [name], female    DOB: August 15, 1960, 57 y.o.   MRN: [id number]  HPI The patient is a 57 YO female coming in for hospital follow up (in for COPD exacerbation, given IV steroids and antibiotics, still having SOB and discharged with oxygen all the time). She is still smoking and would like to quit. She had taken chantix in the past with success of quitting for several months. She then had 1 cigarette and then was smoking again. She has a "very addictive personality". She is feeling like her health is gradually going downhill. She feels that she needs an electric wheelchair or scooter due to limited capacity to walk. She is working with PT at home currently for the next month. Then she is intending to go see her daughter Office manager. She denies fevers or chills. Still SOB and cough. Still smoking currently. Does have back pain and chest wall pain from coughing.   PMH, [facility], social history reviewed and updated.   Review of Systems  Constitutional: Positive for activity change and fatigue. Negative for appetite change, chills, fever and unexpected weight change.  HENT: Negative.   Eyes: Negative.   Respiratory: Positive for cough, shortness of breath and wheezing. Negative for chest tightness.   Cardiovascular: Positive for chest pain. Negative for palpitations and leg swelling.  Gastrointestinal: Negative for abdominal distention, abdominal pain, constipation, diarrhea, nausea and vomiting.  Musculoskeletal: Positive for arthralgias, back pain and gait problem. Negative for myalgias, neck pain and neck stiffness.  Skin: Negative.   Neurological: Negative for dizziness, tremors, seizures, speech difficulty and weakness.       Memory changes  Psychiatric/Behavioral: Negative.       Objective:   Physical Exam  Constitutional: She is oriented to person, place, and time. She appears well-developed and well-nourished.  HENT:  Head: Normocephalic and atraumatic.    Eyes: EOM are normal.  Neck: Normal range of motion.  Cardiovascular: Normal rate and regular rhythm.  Pulmonary/Chest: Effort normal. No respiratory distress. She has no wheezes. She has no rales.  Oxygen in place, lung exam stable with chronic changes  Abdominal: Soft. Bowel sounds are normal. She exhibits no distension. There is no tenderness. There is no rebound.  Musculoskeletal: She exhibits no edema or tenderness.  Neurological: She is alert and oriented to person, place, and time. Coordination normal.  Skin: Skin is warm and dry.  Psychiatric:  Poor historian with repeating stories and details   Vitals:   01/06/18 1337  BP: 122/60  Pulse: 74  Temp: 99 F (37.2 C)  TempSrc: Oral  SpO2: 95%  Weight: 220 lb (99.8 kg)  Height: 5\' 8"  (1.727 m)      Assessment & Plan:

## 2018-01-06 NOTE — Assessment & Plan Note (Signed)
Still finishing medrol taper and breathing is stable on exam. Using oxygen and reminded that smoking while using oxygen is dangerous and she should always remove and turn off oxygen prior to smoking.

## 2018-01-06 NOTE — Assessment & Plan Note (Signed)
Finishing medrol dose pack and finished levaquin. Doing PT at home and using oxygen all the time. On many respiratory medications and will follow with pulmonary. She is still smoking currently and is trying to quit with wellbutrin.

## 2018-01-06 NOTE — Telephone Encounter (Signed)
Fine

## 2018-01-06 NOTE — Assessment & Plan Note (Signed)
Recent evaluation negative for cardiac origin. Suspect musculoskeletal from chronic coughing and lung changes.

## 2018-01-08 DIAGNOSIS — I10 Essential (primary) hypertension: Secondary | ICD-10-CM | POA: Diagnosis not present

## 2018-01-08 DIAGNOSIS — J449 Chronic obstructive pulmonary disease, unspecified: Secondary | ICD-10-CM | POA: Diagnosis not present

## 2018-01-08 DIAGNOSIS — E119 Type 2 diabetes mellitus without complications: Secondary | ICD-10-CM | POA: Diagnosis not present

## 2018-01-08 DIAGNOSIS — E039 Hypothyroidism, unspecified: Secondary | ICD-10-CM | POA: Diagnosis not present

## 2018-01-08 DIAGNOSIS — Z9981 Dependence on supplemental oxygen: Secondary | ICD-10-CM | POA: Diagnosis not present

## 2018-01-08 DIAGNOSIS — G4733 Obstructive sleep apnea (adult) (pediatric): Secondary | ICD-10-CM | POA: Diagnosis not present

## 2018-01-08 DIAGNOSIS — E785 Hyperlipidemia, unspecified: Secondary | ICD-10-CM | POA: Diagnosis not present

## 2018-01-08 DIAGNOSIS — J9611 Chronic respiratory failure with hypoxia: Secondary | ICD-10-CM | POA: Diagnosis not present

## 2018-01-08 DIAGNOSIS — J9621 Acute and chronic respiratory failure with hypoxia: Secondary | ICD-10-CM | POA: Diagnosis not present

## 2018-01-08 DIAGNOSIS — R69 Illness, unspecified: Secondary | ICD-10-CM | POA: Diagnosis not present

## 2018-01-08 DIAGNOSIS — J441 Chronic obstructive pulmonary disease with (acute) exacerbation: Secondary | ICD-10-CM | POA: Diagnosis not present

## 2018-01-08 DIAGNOSIS — G8929 Other chronic pain: Secondary | ICD-10-CM | POA: Diagnosis not present

## 2018-01-09 DIAGNOSIS — G8929 Other chronic pain: Secondary | ICD-10-CM | POA: Diagnosis not present

## 2018-01-09 DIAGNOSIS — J9621 Acute and chronic respiratory failure with hypoxia: Secondary | ICD-10-CM | POA: Diagnosis not present

## 2018-01-09 DIAGNOSIS — E785 Hyperlipidemia, unspecified: Secondary | ICD-10-CM | POA: Diagnosis not present

## 2018-01-09 DIAGNOSIS — I10 Essential (primary) hypertension: Secondary | ICD-10-CM | POA: Diagnosis not present

## 2018-01-09 DIAGNOSIS — R69 Illness, unspecified: Secondary | ICD-10-CM | POA: Diagnosis not present

## 2018-01-09 DIAGNOSIS — Z9981 Dependence on supplemental oxygen: Secondary | ICD-10-CM | POA: Diagnosis not present

## 2018-01-09 DIAGNOSIS — G4733 Obstructive sleep apnea (adult) (pediatric): Secondary | ICD-10-CM | POA: Diagnosis not present

## 2018-01-09 DIAGNOSIS — E039 Hypothyroidism, unspecified: Secondary | ICD-10-CM | POA: Diagnosis not present

## 2018-01-09 DIAGNOSIS — E119 Type 2 diabetes mellitus without complications: Secondary | ICD-10-CM | POA: Diagnosis not present

## 2018-01-09 DIAGNOSIS — J441 Chronic obstructive pulmonary disease with (acute) exacerbation: Secondary | ICD-10-CM | POA: Diagnosis not present

## 2018-01-11 DIAGNOSIS — R269 Unspecified abnormalities of gait and mobility: Secondary | ICD-10-CM | POA: Diagnosis not present

## 2018-01-11 DIAGNOSIS — M6281 Muscle weakness (generalized): Secondary | ICD-10-CM | POA: Diagnosis not present

## 2018-01-12 DIAGNOSIS — G8929 Other chronic pain: Secondary | ICD-10-CM

## 2018-01-12 DIAGNOSIS — E119 Type 2 diabetes mellitus without complications: Secondary | ICD-10-CM | POA: Diagnosis not present

## 2018-01-12 DIAGNOSIS — E785 Hyperlipidemia, unspecified: Secondary | ICD-10-CM

## 2018-01-12 DIAGNOSIS — J441 Chronic obstructive pulmonary disease with (acute) exacerbation: Secondary | ICD-10-CM | POA: Diagnosis not present

## 2018-01-12 DIAGNOSIS — Z9981 Dependence on supplemental oxygen: Secondary | ICD-10-CM | POA: Diagnosis not present

## 2018-01-12 DIAGNOSIS — I10 Essential (primary) hypertension: Secondary | ICD-10-CM | POA: Diagnosis not present

## 2018-01-12 DIAGNOSIS — R69 Illness, unspecified: Secondary | ICD-10-CM | POA: Diagnosis not present

## 2018-01-12 DIAGNOSIS — Z720 Tobacco use: Secondary | ICD-10-CM

## 2018-01-12 DIAGNOSIS — E039 Hypothyroidism, unspecified: Secondary | ICD-10-CM | POA: Diagnosis not present

## 2018-01-12 DIAGNOSIS — F329 Major depressive disorder, single episode, unspecified: Secondary | ICD-10-CM

## 2018-01-12 DIAGNOSIS — J9621 Acute and chronic respiratory failure with hypoxia: Secondary | ICD-10-CM | POA: Diagnosis not present

## 2018-01-12 DIAGNOSIS — G4733 Obstructive sleep apnea (adult) (pediatric): Secondary | ICD-10-CM | POA: Diagnosis not present

## 2018-01-13 ENCOUNTER — Ambulatory Visit: Payer: Medicare HMO | Admitting: Pulmonary Disease

## 2018-01-13 DIAGNOSIS — E119 Type 2 diabetes mellitus without complications: Secondary | ICD-10-CM | POA: Diagnosis not present

## 2018-01-13 DIAGNOSIS — R69 Illness, unspecified: Secondary | ICD-10-CM | POA: Diagnosis not present

## 2018-01-13 DIAGNOSIS — G8929 Other chronic pain: Secondary | ICD-10-CM | POA: Diagnosis not present

## 2018-01-13 DIAGNOSIS — J441 Chronic obstructive pulmonary disease with (acute) exacerbation: Secondary | ICD-10-CM | POA: Diagnosis not present

## 2018-01-13 DIAGNOSIS — E039 Hypothyroidism, unspecified: Secondary | ICD-10-CM | POA: Diagnosis not present

## 2018-01-13 DIAGNOSIS — E785 Hyperlipidemia, unspecified: Secondary | ICD-10-CM | POA: Diagnosis not present

## 2018-01-13 DIAGNOSIS — I10 Essential (primary) hypertension: Secondary | ICD-10-CM | POA: Diagnosis not present

## 2018-01-13 DIAGNOSIS — G4733 Obstructive sleep apnea (adult) (pediatric): Secondary | ICD-10-CM | POA: Diagnosis not present

## 2018-01-13 DIAGNOSIS — J9621 Acute and chronic respiratory failure with hypoxia: Secondary | ICD-10-CM | POA: Diagnosis not present

## 2018-01-13 DIAGNOSIS — Z9981 Dependence on supplemental oxygen: Secondary | ICD-10-CM | POA: Diagnosis not present

## 2018-01-14 ENCOUNTER — Ambulatory Visit: Payer: Medicare HMO | Admitting: Podiatry

## 2018-01-14 DIAGNOSIS — I10 Essential (primary) hypertension: Secondary | ICD-10-CM | POA: Diagnosis not present

## 2018-01-14 DIAGNOSIS — R69 Illness, unspecified: Secondary | ICD-10-CM | POA: Diagnosis not present

## 2018-01-14 DIAGNOSIS — J9621 Acute and chronic respiratory failure with hypoxia: Secondary | ICD-10-CM | POA: Diagnosis not present

## 2018-01-14 DIAGNOSIS — E119 Type 2 diabetes mellitus without complications: Secondary | ICD-10-CM | POA: Diagnosis not present

## 2018-01-14 DIAGNOSIS — E039 Hypothyroidism, unspecified: Secondary | ICD-10-CM | POA: Diagnosis not present

## 2018-01-14 DIAGNOSIS — E785 Hyperlipidemia, unspecified: Secondary | ICD-10-CM | POA: Diagnosis not present

## 2018-01-14 DIAGNOSIS — G8929 Other chronic pain: Secondary | ICD-10-CM | POA: Diagnosis not present

## 2018-01-14 DIAGNOSIS — Z9981 Dependence on supplemental oxygen: Secondary | ICD-10-CM | POA: Diagnosis not present

## 2018-01-14 DIAGNOSIS — G4733 Obstructive sleep apnea (adult) (pediatric): Secondary | ICD-10-CM | POA: Diagnosis not present

## 2018-01-14 DIAGNOSIS — J441 Chronic obstructive pulmonary disease with (acute) exacerbation: Secondary | ICD-10-CM | POA: Diagnosis not present

## 2018-01-15 ENCOUNTER — Ambulatory Visit (INDEPENDENT_AMBULATORY_CARE_PROVIDER_SITE_OTHER): Payer: Medicare HMO | Admitting: Pulmonary Disease

## 2018-01-15 ENCOUNTER — Encounter: Payer: Self-pay | Admitting: Pulmonary Disease

## 2018-01-15 DIAGNOSIS — J410 Simple chronic bronchitis: Secondary | ICD-10-CM | POA: Insufficient documentation

## 2018-01-15 DIAGNOSIS — J9611 Chronic respiratory failure with hypoxia: Secondary | ICD-10-CM

## 2018-01-15 DIAGNOSIS — J449 Chronic obstructive pulmonary disease, unspecified: Secondary | ICD-10-CM

## 2018-01-15 DIAGNOSIS — Z9989 Dependence on other enabling machines and devices: Secondary | ICD-10-CM

## 2018-01-15 DIAGNOSIS — G4733 Obstructive sleep apnea (adult) (pediatric): Secondary | ICD-10-CM | POA: Diagnosis not present

## 2018-01-15 DIAGNOSIS — Z720 Tobacco use: Secondary | ICD-10-CM

## 2018-01-15 NOTE — Assessment & Plan Note (Signed)
Prescription for Medrol Dosepak to take in an emergency during your trip.   Continue on Symbicort, Spiriva, Daliresp Use  albuterol nebs as needed every 6 hours

## 2018-01-15 NOTE — Progress Notes (Signed)
   Subjective:    Patient ID: Joan [name], female    DOB: [birth year], 57 y.o.   MRN: [id number]  HPI  57 year old ex-smoker for follow-up of COPD and OSA  She was hospitalized 11/3 for 3 days for COPD exacerbation and treated with Levaquin and prednisone.  She is compliant with oxygen. Unfortunately she has a relapse with her smoking and is up to 1 pack/day.  She is about to start on Wellbutrin. She reports of upper thoracic back pain nonradiating and this seems to make her breathing worse.  She has tried naproxen for it with some relief.  We reviewed CT chest from 10/19 which does not show any evidence of vertebral fracture  She is confused about her medications and we performed in detail medication review  She denies any problems with mask or pressure, CPAP is certainly helped improve her daytime somnolence and fatigue. CPAP download was reviewed which shows excellent control of events on 10 cm with mild leak and good compliance more than 7 hours every night  Significant tests/ events reviewed  NPSG 02/2017 >> no OSA HST  04/2017 15/h  10/2016 PFTs showed ratio 49, FEV1 of 36%, FVC 58% and DLCO of 34% consistent with severe obstruction.  11/2016 CT chest bullous changes BL, 23mm RUl & 2 mm LUL  nodules >> stable 11/2017   09/2016 echo normal.  ONO10/2018 on CPAP/room air showed desaturation for 1 hour 45 minutes started on nocturnal oxygen>>  Review of Systems neg for any significant sore throat, dysphagia, itching, sneezing, nasal congestion or excess/ purulent secretions, fever, chills, sweats, unintended wt loss, pleuritic or exertional cp, hempoptysis, orthopnea pnd or change in chronic leg swelling. Also denies presyncope, palpitations, heartburn, abdominal pain, nausea, vomiting, diarrhea or change in bowel or urinary habits, dysuria,hematuria, rash, arthralgias, visual complaints, headache, numbness weakness or ataxia.     Objective:   Physical Exam   Gen. Pleasant,  obese, in no distress ENT - no lesions, no post nasal drip Neck: No JVD, no thyromegaly, no carotid bruits Lungs: no use of accessory muscles, no dullness to percussion, decreased without rales or rhonchi  Cardiovascular: Rhythm regular, heart sounds  normal, no murmurs or gallops, no peripheral edema Musculoskeletal: No deformities, no cyanosis or clubbing , no tremors        Assessment & Plan:

## 2018-01-15 NOTE — Patient Instructions (Addendum)
Prescription for Medrol Dosepak to take in an emergency during your trip.  You have to quit smoking!  Continue on Symbicort, Spiriva, Daliresp Use  albuterol nebs as needed every 6 hours

## 2018-01-15 NOTE — Assessment & Plan Note (Signed)
CPAP 10 cm seems to be working well.  Weight loss encouraged, compliance with goal of at least 4-6 hrs every night is the expectation. Advised against medications with sedative side effects Cautioned against driving when sleepy - understanding that sleepiness will vary on a day to day basis

## 2018-01-15 NOTE — Assessment & Plan Note (Signed)
Smoking cessation again emphasized as the main intervention here She is trying to cut down with Wellbutrin but was not ready to commit to a quit date

## 2018-01-15 NOTE — Assessment & Plan Note (Signed)
Continue 2 L oxygen at all times 

## 2018-01-16 ENCOUNTER — Telehealth: Payer: Self-pay

## 2018-01-16 DIAGNOSIS — J9621 Acute and chronic respiratory failure with hypoxia: Secondary | ICD-10-CM | POA: Diagnosis not present

## 2018-01-16 DIAGNOSIS — E119 Type 2 diabetes mellitus without complications: Secondary | ICD-10-CM | POA: Diagnosis not present

## 2018-01-16 DIAGNOSIS — Z9981 Dependence on supplemental oxygen: Secondary | ICD-10-CM | POA: Diagnosis not present

## 2018-01-16 DIAGNOSIS — E785 Hyperlipidemia, unspecified: Secondary | ICD-10-CM | POA: Diagnosis not present

## 2018-01-16 DIAGNOSIS — G4733 Obstructive sleep apnea (adult) (pediatric): Secondary | ICD-10-CM | POA: Diagnosis not present

## 2018-01-16 DIAGNOSIS — I10 Essential (primary) hypertension: Secondary | ICD-10-CM | POA: Diagnosis not present

## 2018-01-16 DIAGNOSIS — J441 Chronic obstructive pulmonary disease with (acute) exacerbation: Secondary | ICD-10-CM | POA: Diagnosis not present

## 2018-01-16 DIAGNOSIS — E039 Hypothyroidism, unspecified: Secondary | ICD-10-CM | POA: Diagnosis not present

## 2018-01-16 DIAGNOSIS — R69 Illness, unspecified: Secondary | ICD-10-CM | POA: Diagnosis not present

## 2018-01-16 DIAGNOSIS — G8929 Other chronic pain: Secondary | ICD-10-CM | POA: Diagnosis not present

## 2018-01-16 NOTE — Telephone Encounter (Signed)
Baclofen 02/24/2018-02/25/2019 Venlafaxine 02/24/2018-02/25/2019 Betamethasone 02/24/2018-02/25/2019 Valcyclovir 02/24/2018-02/25/2019 Nystatin-triamcinolone 02/24/2018-02/25/2019

## 2018-01-19 ENCOUNTER — Telehealth: Payer: Self-pay | Admitting: Pulmonary Disease

## 2018-01-19 ENCOUNTER — Ambulatory Visit: Payer: Medicare HMO | Admitting: Pulmonary Disease

## 2018-01-19 DIAGNOSIS — E785 Hyperlipidemia, unspecified: Secondary | ICD-10-CM | POA: Diagnosis not present

## 2018-01-19 DIAGNOSIS — G4733 Obstructive sleep apnea (adult) (pediatric): Secondary | ICD-10-CM | POA: Diagnosis not present

## 2018-01-19 DIAGNOSIS — J9621 Acute and chronic respiratory failure with hypoxia: Secondary | ICD-10-CM | POA: Diagnosis not present

## 2018-01-19 DIAGNOSIS — R69 Illness, unspecified: Secondary | ICD-10-CM | POA: Diagnosis not present

## 2018-01-19 DIAGNOSIS — I10 Essential (primary) hypertension: Secondary | ICD-10-CM | POA: Diagnosis not present

## 2018-01-19 DIAGNOSIS — Z9981 Dependence on supplemental oxygen: Secondary | ICD-10-CM | POA: Diagnosis not present

## 2018-01-19 DIAGNOSIS — J441 Chronic obstructive pulmonary disease with (acute) exacerbation: Secondary | ICD-10-CM | POA: Diagnosis not present

## 2018-01-19 DIAGNOSIS — E039 Hypothyroidism, unspecified: Secondary | ICD-10-CM | POA: Diagnosis not present

## 2018-01-19 DIAGNOSIS — E119 Type 2 diabetes mellitus without complications: Secondary | ICD-10-CM | POA: Diagnosis not present

## 2018-01-19 DIAGNOSIS — G8929 Other chronic pain: Secondary | ICD-10-CM | POA: Diagnosis not present

## 2018-01-19 NOTE — Telephone Encounter (Signed)
Per Cherina, send to her.

## 2018-01-20 DIAGNOSIS — I10 Essential (primary) hypertension: Secondary | ICD-10-CM | POA: Diagnosis not present

## 2018-01-20 DIAGNOSIS — E039 Hypothyroidism, unspecified: Secondary | ICD-10-CM | POA: Diagnosis not present

## 2018-01-20 DIAGNOSIS — G4733 Obstructive sleep apnea (adult) (pediatric): Secondary | ICD-10-CM | POA: Diagnosis not present

## 2018-01-20 DIAGNOSIS — J441 Chronic obstructive pulmonary disease with (acute) exacerbation: Secondary | ICD-10-CM | POA: Diagnosis not present

## 2018-01-20 DIAGNOSIS — G8929 Other chronic pain: Secondary | ICD-10-CM | POA: Diagnosis not present

## 2018-01-20 DIAGNOSIS — Z9981 Dependence on supplemental oxygen: Secondary | ICD-10-CM | POA: Diagnosis not present

## 2018-01-20 DIAGNOSIS — E785 Hyperlipidemia, unspecified: Secondary | ICD-10-CM | POA: Diagnosis not present

## 2018-01-20 DIAGNOSIS — R69 Illness, unspecified: Secondary | ICD-10-CM | POA: Diagnosis not present

## 2018-01-20 DIAGNOSIS — J9621 Acute and chronic respiratory failure with hypoxia: Secondary | ICD-10-CM | POA: Diagnosis not present

## 2018-01-20 DIAGNOSIS — E119 Type 2 diabetes mellitus without complications: Secondary | ICD-10-CM | POA: Diagnosis not present

## 2018-01-21 DIAGNOSIS — G4733 Obstructive sleep apnea (adult) (pediatric): Secondary | ICD-10-CM | POA: Diagnosis not present

## 2018-01-21 DIAGNOSIS — E039 Hypothyroidism, unspecified: Secondary | ICD-10-CM | POA: Diagnosis not present

## 2018-01-21 DIAGNOSIS — E119 Type 2 diabetes mellitus without complications: Secondary | ICD-10-CM | POA: Diagnosis not present

## 2018-01-21 DIAGNOSIS — J9611 Chronic respiratory failure with hypoxia: Secondary | ICD-10-CM | POA: Diagnosis not present

## 2018-01-21 DIAGNOSIS — G8929 Other chronic pain: Secondary | ICD-10-CM | POA: Diagnosis not present

## 2018-01-21 DIAGNOSIS — J9621 Acute and chronic respiratory failure with hypoxia: Secondary | ICD-10-CM | POA: Diagnosis not present

## 2018-01-21 DIAGNOSIS — J449 Chronic obstructive pulmonary disease, unspecified: Secondary | ICD-10-CM | POA: Diagnosis not present

## 2018-01-21 DIAGNOSIS — I10 Essential (primary) hypertension: Secondary | ICD-10-CM | POA: Diagnosis not present

## 2018-01-21 DIAGNOSIS — R69 Illness, unspecified: Secondary | ICD-10-CM | POA: Diagnosis not present

## 2018-01-21 DIAGNOSIS — J441 Chronic obstructive pulmonary disease with (acute) exacerbation: Secondary | ICD-10-CM | POA: Diagnosis not present

## 2018-01-21 DIAGNOSIS — Z9981 Dependence on supplemental oxygen: Secondary | ICD-10-CM | POA: Diagnosis not present

## 2018-01-21 DIAGNOSIS — E785 Hyperlipidemia, unspecified: Secondary | ICD-10-CM | POA: Diagnosis not present

## 2018-01-21 NOTE — Telephone Encounter (Signed)
RA, please advise on the dosepak instructions. Thanks!

## 2018-01-21 NOTE — Telephone Encounter (Signed)
Joan [name] please advise on this, thank you.

## 2018-01-26 ENCOUNTER — Other Ambulatory Visit: Payer: Self-pay | Admitting: Internal Medicine

## 2018-01-26 DIAGNOSIS — E119 Type 2 diabetes mellitus without complications: Secondary | ICD-10-CM | POA: Diagnosis not present

## 2018-01-26 DIAGNOSIS — Z9981 Dependence on supplemental oxygen: Secondary | ICD-10-CM | POA: Diagnosis not present

## 2018-01-26 DIAGNOSIS — E785 Hyperlipidemia, unspecified: Secondary | ICD-10-CM | POA: Diagnosis not present

## 2018-01-26 DIAGNOSIS — J9621 Acute and chronic respiratory failure with hypoxia: Secondary | ICD-10-CM | POA: Diagnosis not present

## 2018-01-26 DIAGNOSIS — E039 Hypothyroidism, unspecified: Secondary | ICD-10-CM | POA: Diagnosis not present

## 2018-01-26 DIAGNOSIS — J441 Chronic obstructive pulmonary disease with (acute) exacerbation: Secondary | ICD-10-CM | POA: Diagnosis not present

## 2018-01-26 DIAGNOSIS — R69 Illness, unspecified: Secondary | ICD-10-CM | POA: Diagnosis not present

## 2018-01-26 DIAGNOSIS — G4733 Obstructive sleep apnea (adult) (pediatric): Secondary | ICD-10-CM | POA: Diagnosis not present

## 2018-01-26 DIAGNOSIS — G8929 Other chronic pain: Secondary | ICD-10-CM | POA: Diagnosis not present

## 2018-01-26 DIAGNOSIS — I10 Essential (primary) hypertension: Secondary | ICD-10-CM | POA: Diagnosis not present

## 2018-01-26 NOTE — Telephone Encounter (Signed)
Attempted to call pt but unable to reach her. Left message for pt to return call. 

## 2018-01-26 NOTE — Telephone Encounter (Signed)
Medrol 4 mg dosepak - take per directions

## 2018-01-27 DIAGNOSIS — E039 Hypothyroidism, unspecified: Secondary | ICD-10-CM | POA: Diagnosis not present

## 2018-01-27 DIAGNOSIS — R69 Illness, unspecified: Secondary | ICD-10-CM | POA: Diagnosis not present

## 2018-01-27 DIAGNOSIS — J441 Chronic obstructive pulmonary disease with (acute) exacerbation: Secondary | ICD-10-CM | POA: Diagnosis not present

## 2018-01-27 DIAGNOSIS — J9621 Acute and chronic respiratory failure with hypoxia: Secondary | ICD-10-CM | POA: Diagnosis not present

## 2018-01-27 DIAGNOSIS — E119 Type 2 diabetes mellitus without complications: Secondary | ICD-10-CM | POA: Diagnosis not present

## 2018-01-27 DIAGNOSIS — I10 Essential (primary) hypertension: Secondary | ICD-10-CM | POA: Diagnosis not present

## 2018-01-27 DIAGNOSIS — G4733 Obstructive sleep apnea (adult) (pediatric): Secondary | ICD-10-CM | POA: Diagnosis not present

## 2018-01-27 DIAGNOSIS — E785 Hyperlipidemia, unspecified: Secondary | ICD-10-CM | POA: Diagnosis not present

## 2018-01-27 DIAGNOSIS — G8929 Other chronic pain: Secondary | ICD-10-CM | POA: Diagnosis not present

## 2018-01-27 DIAGNOSIS — Z9981 Dependence on supplemental oxygen: Secondary | ICD-10-CM | POA: Diagnosis not present

## 2018-01-27 MED ORDER — METHYLPREDNISOLONE 4 MG PO TBPK: ORAL_TABLET | ORAL | 0 refills | Status: DC

## 2018-01-27 NOTE — Telephone Encounter (Signed)
Left message for patient on number provided (DPR on file) stating Rx has been sent to pharmacy and to call our office for further questions or concerns.

## 2018-01-28 DIAGNOSIS — G4733 Obstructive sleep apnea (adult) (pediatric): Secondary | ICD-10-CM | POA: Diagnosis not present

## 2018-01-28 DIAGNOSIS — J441 Chronic obstructive pulmonary disease with (acute) exacerbation: Secondary | ICD-10-CM | POA: Diagnosis not present

## 2018-01-28 DIAGNOSIS — E039 Hypothyroidism, unspecified: Secondary | ICD-10-CM | POA: Diagnosis not present

## 2018-01-28 DIAGNOSIS — Z9981 Dependence on supplemental oxygen: Secondary | ICD-10-CM | POA: Diagnosis not present

## 2018-01-28 DIAGNOSIS — E785 Hyperlipidemia, unspecified: Secondary | ICD-10-CM | POA: Diagnosis not present

## 2018-01-28 DIAGNOSIS — R69 Illness, unspecified: Secondary | ICD-10-CM | POA: Diagnosis not present

## 2018-01-28 DIAGNOSIS — G8929 Other chronic pain: Secondary | ICD-10-CM | POA: Diagnosis not present

## 2018-01-28 DIAGNOSIS — I10 Essential (primary) hypertension: Secondary | ICD-10-CM | POA: Diagnosis not present

## 2018-01-28 DIAGNOSIS — J9621 Acute and chronic respiratory failure with hypoxia: Secondary | ICD-10-CM | POA: Diagnosis not present

## 2018-01-28 DIAGNOSIS — E119 Type 2 diabetes mellitus without complications: Secondary | ICD-10-CM | POA: Diagnosis not present

## 2018-02-02 ENCOUNTER — Other Ambulatory Visit: Payer: Self-pay | Admitting: Internal Medicine

## 2018-02-07 DIAGNOSIS — J449 Chronic obstructive pulmonary disease, unspecified: Secondary | ICD-10-CM | POA: Diagnosis not present

## 2018-02-07 DIAGNOSIS — J9611 Chronic respiratory failure with hypoxia: Secondary | ICD-10-CM | POA: Diagnosis not present

## 2018-02-07 DIAGNOSIS — J441 Chronic obstructive pulmonary disease with (acute) exacerbation: Secondary | ICD-10-CM | POA: Diagnosis not present

## 2018-02-10 DIAGNOSIS — R269 Unspecified abnormalities of gait and mobility: Secondary | ICD-10-CM | POA: Diagnosis not present

## 2018-02-10 DIAGNOSIS — M6281 Muscle weakness (generalized): Secondary | ICD-10-CM | POA: Diagnosis not present

## 2018-02-14 ENCOUNTER — Encounter: Payer: Self-pay | Admitting: Internal Medicine

## 2018-02-16 MED ORDER — HYDROCHLOROTHIAZIDE 25 MG PO TABS: 25.0000 mg | ORAL_TABLET | Freq: Every day | ORAL | 3 refills | Status: DC

## 2018-02-16 MED ORDER — METHOCARBAMOL 500 MG PO TABS: 500.0000 mg | ORAL_TABLET | Freq: Every day | ORAL | 2 refills | Status: DC

## 2018-02-20 DIAGNOSIS — J449 Chronic obstructive pulmonary disease, unspecified: Secondary | ICD-10-CM | POA: Diagnosis not present

## 2018-02-20 DIAGNOSIS — J9611 Chronic respiratory failure with hypoxia: Secondary | ICD-10-CM | POA: Diagnosis not present

## 2018-02-20 DIAGNOSIS — J441 Chronic obstructive pulmonary disease with (acute) exacerbation: Secondary | ICD-10-CM | POA: Diagnosis not present

## 2018-02-20 DIAGNOSIS — G4733 Obstructive sleep apnea (adult) (pediatric): Secondary | ICD-10-CM | POA: Diagnosis not present

## 2018-02-23 ENCOUNTER — Other Ambulatory Visit: Payer: Self-pay

## 2018-02-23 MED ORDER — HYDROCHLOROTHIAZIDE 25 MG PO TABS: 25.0000 mg | ORAL_TABLET | Freq: Every day | ORAL | 3 refills | Status: DC

## 2018-02-23 MED ORDER — SPIRIVA RESPIMAT 2.5 MCG/ACT IN AERS: 2.0000 | INHALATION_SPRAY | RESPIRATORY_TRACT | 3 refills | Status: DC

## 2018-02-23 MED ORDER — ALBUTEROL SULFATE HFA 108 (90 BASE) MCG/ACT IN AERS: 1.0000 | INHALATION_SPRAY | Freq: Four times a day (QID) | RESPIRATORY_TRACT | 3 refills | Status: DC | PRN

## 2018-02-23 MED ORDER — METHOCARBAMOL 500 MG PO TABS: 500.0000 mg | ORAL_TABLET | Freq: Every day | ORAL | 2 refills | Status: DC

## 2018-02-27 ENCOUNTER — Telehealth: Payer: Self-pay | Admitting: Internal Medicine

## 2018-02-27 DIAGNOSIS — R69 Illness, unspecified: Secondary | ICD-10-CM | POA: Diagnosis not present

## 2018-02-27 NOTE — Telephone Encounter (Signed)
Pa was denied.

## 2018-02-27 NOTE — Telephone Encounter (Signed)
Copied from [place] [phone]. Topic: Quick Communication - See Telephone Encounter >> Feb 27, 2018  9:16 AM Antonieta Iba C wrote: CRM for notification. See Telephone encounter for: 02/27/18.  Roxanne with [facility] is calling in to see if PCP could change medication methocarbamol (ROBAXIN) 500 MG tablet to an alternative? Current Rx isnt covered by pt's insurance. Suggested alternative is Tizanidine OR office could complete a PA. (Phone # below)    PA- [id number]   CB: [id number] Ref# [id number]

## 2018-02-27 NOTE — Telephone Encounter (Signed)
PA started on CoverMyMeds KEY: [id number]

## 2018-02-27 NOTE — Telephone Encounter (Signed)
She also has flexeril on her list from another provider. Should not be taking both. Is she taking the flexeril currently?

## 2018-03-02 ENCOUNTER — Other Ambulatory Visit: Payer: Self-pay | Admitting: Internal Medicine

## 2018-03-02 ENCOUNTER — Other Ambulatory Visit: Payer: Self-pay | Admitting: Acute Care

## 2018-03-02 ENCOUNTER — Other Ambulatory Visit: Payer: Self-pay | Admitting: Pulmonary Disease

## 2018-03-10 DIAGNOSIS — J441 Chronic obstructive pulmonary disease with (acute) exacerbation: Secondary | ICD-10-CM | POA: Diagnosis not present

## 2018-03-10 DIAGNOSIS — J449 Chronic obstructive pulmonary disease, unspecified: Secondary | ICD-10-CM | POA: Diagnosis not present

## 2018-03-10 DIAGNOSIS — J9611 Chronic respiratory failure with hypoxia: Secondary | ICD-10-CM | POA: Diagnosis not present

## 2018-03-11 ENCOUNTER — Other Ambulatory Visit: Payer: Self-pay

## 2018-03-11 MED ORDER — CYCLOBENZAPRINE HCL 5 MG PO TABS: 5.0000 mg | ORAL_TABLET | Freq: Every day | ORAL | 1 refills | Status: DC

## 2018-03-11 NOTE — Progress Notes (Signed)
Refill on RX Flexeril 5mg  30tab

## 2018-03-12 ENCOUNTER — Telehealth: Payer: Self-pay

## 2018-03-12 NOTE — Telephone Encounter (Signed)
PA started on CoverMyMeds KEY: [id number]   Approved till 02/25/2019

## 2018-03-13 DIAGNOSIS — M6281 Muscle weakness (generalized): Secondary | ICD-10-CM | POA: Diagnosis not present

## 2018-03-13 DIAGNOSIS — R269 Unspecified abnormalities of gait and mobility: Secondary | ICD-10-CM | POA: Diagnosis not present

## 2018-03-16 ENCOUNTER — Encounter: Payer: Self-pay | Admitting: Podiatry

## 2018-03-18 ENCOUNTER — Ambulatory Visit: Payer: Medicare HMO | Admitting: Acute Care

## 2018-03-18 NOTE — Progress Notes (Deleted)
History of Present Illness Joan [name] is a 58 y.o. female current every day smoker a 44 pack year smoking history, COPD and OSA on CPAP. She is followed by Dr. Elsworth Soho.   Maintenance regimen: Symbicort , Spiriva, Daliresp, Albuterol nebs, Xopenex nebs   03/18/2018 Follow up: Pt. Presents for follow up. She was last seen 01/15/2018 for hospital follow up by Dr. Elsworth Soho. She had been hospitalized 11/3-11/6  for a COPD Flare. She was treated with Levaquin and prednisone, scheduled BD and oxygen.She has resumed smoking and was smoking 1 ppd when Dr. Elsworth Soho saw her. She was compliant with her oxygen and her Symbicort, Spiriva and Daliresp at the time of the last OV.  She presents today for follow up. She states she is doing well.  Test Results:  CPAP Down Load 02/16/2018-03/17/2018 AirSense 10 AutoSet Set pressure of 10 cm pressure Usage 29/30 days( 97%) > 4 hours 28 days( 93%) < 4 hours 1 day( 3%) AHI is 3.4 Central Apneas 0.3 Obstructive Apneas 2.8 Unknown 0.2  CT Chest 11/2017 Stable 4 mm right upper lobe nodule and 2 mm left upper lobe nodule. A previously demonstrated 3 mm left upper lobe nodule is no longer seen. No follow-up needed if patient is low-risk (and has no known or suspected primary neoplasm). Non-contrast chest CT can be considered in 12 months if patient is high-risk.  Changes of COPD with centrilobular emphysema. Calcific coronary artery and aortic atherosclerosis.  NPSG 02/2017 >> no OSA HST 04/2017 15/h  10/2016 PFTs showed ratio 49, FEV1 of 36%, FVC 58% and DLCO of 34% consistent with severe obstruction.  11/2016 CT chest bullous changes BL, 23mm RUl & 2 mm LUL  nodules >> stable 11/2017   09/2016 echo normal.  ONO10/2018 on CPAP/room air showed desaturation for 1 hour 45 minutes started on nocturnal oxygen>>  CBC Latest Ref Rng & Units 12/30/2017 12/29/2017 12/28/2017  WBC 4.0 - 10.5 K/uL 17.3(H) 13.9(H) 19.2(H)  Hemoglobin 12.0 - 15.0 g/dL 10.6(L) 11.1(L)  11.7(L)  Hematocrit 36.0 - 46.0 % 34.7(L) 35.2(L) 37.9  Platelets 150 - 400 K/uL 282 273 361    BMP Latest Ref Rng & Units 12/30/2017 12/29/2017 12/28/2017  Glucose 70 - 99 mg/dL 203(H) 362(H) 143(H)  BUN 6 - 20 mg/dL 24(H) 20 18  Creatinine 0.44 - 1.00 mg/dL 1.23(H) 1.31(H) 1.36(H)  Sodium 135 - 145 mmol/L 140 140 141  Potassium 3.5 - 5.1 mmol/L 4.5 3.4(L) 3.6  Chloride 98 - 111 mmol/L 104 106 105  CO2 22 - 32 mmol/L 32 24 26  Calcium 8.9 - 10.3 mg/dL 9.0 8.3(L) 8.8(L)    BNP    Component Value Date/Time   BNP 114.5 (H) 12/29/2017 0609    ProBNP No results found for: PROBNP  PFT    Component Value Date/Time   FEV1PRE 1.05 10/30/2016 0847   FEV1POST 1.23 10/30/2016 0847   FVCPRE 2.16 10/30/2016 0847   FVCPOST 2.53 10/30/2016 0847   TLC 5.36 10/30/2016 0847   DLCOUNC 9.40 10/30/2016 0847   PREFEV1FVCRT 49 10/30/2016 0847   PSTFEV1FVCRT 49 10/30/2016 0847    No results found.   Past medical hx Past Medical History:  Diagnosis Date  . Acute respiratory failure with hypoxia ([place])   . Alcoholism and drug addiction in family   . Anxiety   . Arthritis    "knees, hips, lower back" (09/25/2016)  . Asthma   . Bipolar disorder ([place])   . Chronic bronchitis ([place])    "all the  time" (09/25/2016)  . Chronic leg pain    RLE  . Chronic lower back pain   . COPD (chronic obstructive pulmonary disease) ([place])   . Depression   . Emphysema of lung ([place])   . GERD (gastroesophageal reflux disease)   . Headache   . Hepatitis C    "treated back in 2001-2002"  . High cholesterol   . Hypertension   . Hypothyroidism   . Incarcerated ventral hernia 04/04/2017  . On home oxygen therapy    "2L; at nighttime" (04/04/2017)  . OSA on CPAP   . Pneumonia    "all the time" (09/25/2016)  . Positive PPD    with negative chest x ray  . Tachycardia    patient reports with exertion ; denies any other conditions   . Thyroid disease      Social History   Tobacco Use  . Smoking status: Former  Smoker    Packs/day: 1.00    Years: 44.00    Pack years: 44.00    Types: Cigarettes    Last attempt to quit: 09/29/2017    Years since quitting: 0.4  . Smokeless tobacco: Never Used  Substance Use Topics  . Alcohol use: Yes  . Drug use: Yes    Types: "Crack" cocaine    Comment: 04/04/2017 "nothing since 01/12/2012"   She has history of Crack use   Tobacco Cessation: Current every day smoker after a relapse I have spent 5 minutes counseling patient on smoking cessation this visit. Patient verbalizes understanding of their  choice to continue smoking and the negative health consequences including worsening of COPD, risk of lung cancer , stroke and heart disease. She had a recent relapse, was smoking 1 PPD  prior to her last hospitalization 12/2017.    Past surgical hx, Family hx, Social hx all reviewed.  Current Outpatient Medications on File Prior to Visit  Medication Sig  . albuterol (PROVENTIL HFA;VENTOLIN HFA) 108 (90 Base) MCG/ACT inhaler USE 2 INHALATIONS ORALLY INTO THE LUNGS EVERY 6 HOURS AS NEEDED FOR WHEEZING OR SHORTNESS OF BREATH (BEFORE ACTIVITY)  . albuterol (PROVENTIL) (2.5 MG/3ML) 0.083% nebulizer solution Take 3 mLs (2.5 mg total) by nebulization 2 (two) times daily. And every 6 hours as needed  . atenolol (TENORMIN) 25 MG tablet Take 1 tablet (25 mg total) by mouth daily.  Marland Kitchen atorvastatin (LIPITOR) 20 MG tablet Take 1 tablet (20 mg total) by mouth daily.  . baclofen (LIORESAL) 10 MG tablet Take 1 tablet (10 mg total) by mouth at bedtime.  . betamethasone valerate ointment (VALISONE) 0.1 % USE 1 APPLICATION TOPICALLYTWO TIMES A DAY  . buPROPion (WELLBUTRIN XL) 150 MG 24 hr tablet TAKE 1 TABLET BY MOUTH EVERY DAY  . cyclobenzaprine (FLEXERIL) 5 MG tablet Take 1 tablet (5 mg total) by mouth at bedtime.  Marland Kitchen DALIRESP 500 MCG TABS tablet TAKE 1 TABLET DAILY  . DALIRESP 500 MCG TABS tablet TAKE 1 TABLET DAILY  . diphenhydrAMINE (BENADRYL) 25 MG tablet Take 50 mg by mouth daily.    . hydrochlorothiazide (HYDRODIURIL) 25 MG tablet Take 1 tablet (25 mg total) by mouth daily.  . Ipratropium-Albuterol (COMBIVENT RESPIMAT) 20-100 MCG/ACT AERS respimat Inhale 1 puff into the lungs every 6 (six) hours as needed for wheezing.  Marland Kitchen levothyroxine (SYNTHROID, LEVOTHROID) 25 MCG tablet Take 1 tablet (25 mcg total) by mouth daily before breakfast.  . methylPREDNISolone (MEDROL) 4 MG TBPK tablet 1 pack-take as directed  . montelukast (SINGULAIR) 10 MG tablet Take 1 tablet (  10 mg total) by mouth at bedtime.  . naproxen (NAPROSYN) 500 MG tablet TAKE 1 TABLET TWICE DAILY  WITH MEALS  . nystatin-triamcinolone ointment (MYCOLOG) Apply 1 application topically 2 (two) times daily as needed (applied to skin folds/groins/under breast skin irritation rash.).  Marland Kitchen OXYGEN Inhale 2 L into the lungs daily.  . pantoprazole (PROTONIX) 40 MG tablet Take 1 tablet (40 mg total) by mouth daily. (Patient taking differently: Take 40 mg by mouth 2 (two) times daily. )  . pregabalin (LYRICA) 75 MG capsule Take 1 capsule (75 mg total) by mouth 2 (two) times daily.  Marland Kitchen SPIRIVA RESPIMAT 2.5 MCG/ACT AERS Inhale 2 puffs into the lungs every morning.  . SYMBICORT 160-4.5 MCG/ACT inhaler Inhale 2 puffs into the lungs 2 (two) times daily.  . traZODone (DESYREL) 100 MG tablet Take 1 tablet (100 mg total) by mouth at bedtime.  . valACYclovir (VALTREX) 500 MG tablet Take 1 tablet (500 mg total) by mouth daily.  Marland Kitchen venlafaxine XR (EFFEXOR-XR) 150 MG 24 hr capsule TAKE 1 CAPSULE DAILY WITH  BREAKFAST (Patient taking differently: Take 150 mg by mouth daily with breakfast. )   No current facility-administered medications on file prior to visit.      Allergies  Allergen Reactions  . Keflex [Cephalexin] Other (See Comments)    unspecified  . Prednisone Other (See Comments)    "counter reacts"  . Shellfish Allergy Other (See Comments)    Stomach cramps  . Tetracyclines & Related Other (See Comments)    unspecified  .  Dilaudid [Hydromorphone Hcl] Other (See Comments)    "Lethargy"  . Incruse Ellipta [Umeclidinium Bromide] Nausea Only  . Tuberculin Tests Other (See Comments)    Review Of Systems:  Constitutional:   No  weight loss, night sweats,  Fevers, chills, fatigue, or  lassitude.  HEENT:   No headaches,  Difficulty swallowing,  Tooth/dental problems, or  Sore throat,                No sneezing, itching, ear ache, nasal congestion, post nasal drip,   CV:  No chest pain,  Orthopnea, PND, swelling in lower extremities, anasarca, dizziness, palpitations, syncope.   GI  No heartburn, indigestion, abdominal pain, nausea, vomiting, diarrhea, change in bowel habits, loss of appetite, bloody stools.   Resp: No shortness of breath with exertion or at rest.  No excess mucus, no productive cough,  No non-productive cough,  No coughing up of blood.  No change in color of mucus.  No wheezing.  No chest wall deformity  Skin: no rash or lesions.  GU: no dysuria, change in color of urine, no urgency or frequency.  No flank pain, no hematuria   MS:  No joint pain or swelling.  No decreased range of motion.  No back pain.  Psych:  No change in mood or affect. No depression or anxiety.  No memory loss.   Vital Signs There were no vitals taken for this visit.   Physical Exam:  General- No distress,  A&Ox3 ENT: No sinus tenderness, TM clear, pale nasal mucosa, no oral exudate,no post nasal drip, no LAN Cardiac: S1, S2, regular rate and rhythm, no murmur Chest: No wheeze/ rales/ dullness; no accessory muscle use, no nasal flaring, no sternal retractions Abd.: Soft Non-tender Ext: No clubbing cyanosis, edema Neuro:  normal strength Skin: No rashes, warm and dry Psych: normal mood and behavior   Assessment/Plan  No problem-specific Assessment & Plan notes found for this encounter.  COPD  with chronic bronchitis and emphysema ([place]) Baseline Needs to use Spiriva in addition to [employer] has  refused Spiriva despite failing Incruse and Ventolin Plan: QUIT SMOKING It is good to see you today. Continue Symbicort, Spiriva  and Daliresp as you have been doing. Rinse mouth after use Continue albuterol nebs 3 times daily  We will send in a prescription for the Xopenex nebs  Use these twice daily as needed. Continue Singulair daily We will send in a prescription for your Singulair Continue Pro Air as needed for rescue. Wear your oxygen at 2 L [place] with exertion  Saturation goals are 88-92% Note your daily symptoms >remember "red flags" for COPD: Increase in cough, increase in sputum production, increase in shortness of breath or activity intolerance. If you notice these symptoms, please call to be seen.  Follow up with Judson Roch NP or Dr.Alva in 4-6 weeks to make sure you are better. Please contact office for sooner follow up if symptoms do not improve or worsen or seek emergency care    OSA treated with BiPAP 100% compliant with her CPAP Plan: Continue on CPAP at bedtime. You appear to be benefiting from the treatment Goal is to wear for at least 6 hours each night for maximal clinical benefit. Continue to work on weight loss, as the link between excess weight  and sleep apnea is well established.  Do not drive if sleepy. Remember to clean mask, tubing, filter, and reservoir once weekly with soapy water.  Follow up with Judson Roch NP of Dr. Elsworth Soho in 6 months or before as needed.   Chronic respiratory failure with hypoxia (HCC) Saturation goals are 88-92% Wear oxygen to maintain these saturation goals  Pulmonary Nodule CT 11/2017 shows stable 4 mm right upper lobe nodule and 2 mm left upper lobe Current every day smoker 44 pack year smoking history Plan Per Fleischner Society guidelines, as patient is high risk ( Current smoker with a 44 pack year smoking history) we will do CT Chest without contrast 11/2018 as follow up. nodule.   [name], NP 03/18/2018  9:57  AM

## 2018-03-21 DIAGNOSIS — E119 Type 2 diabetes mellitus without complications: Secondary | ICD-10-CM | POA: Diagnosis not present

## 2018-03-23 ENCOUNTER — Encounter: Payer: Self-pay | Admitting: Podiatry

## 2018-03-23 ENCOUNTER — Ambulatory Visit: Payer: Medicare HMO | Admitting: Podiatry

## 2018-03-23 DIAGNOSIS — L603 Nail dystrophy: Secondary | ICD-10-CM | POA: Diagnosis not present

## 2018-03-23 DIAGNOSIS — R252 Cramp and spasm: Secondary | ICD-10-CM

## 2018-03-23 DIAGNOSIS — J441 Chronic obstructive pulmonary disease with (acute) exacerbation: Secondary | ICD-10-CM | POA: Diagnosis not present

## 2018-03-23 DIAGNOSIS — J449 Chronic obstructive pulmonary disease, unspecified: Secondary | ICD-10-CM | POA: Diagnosis not present

## 2018-03-23 DIAGNOSIS — J9611 Chronic respiratory failure with hypoxia: Secondary | ICD-10-CM | POA: Diagnosis not present

## 2018-03-23 DIAGNOSIS — G4733 Obstructive sleep apnea (adult) (pediatric): Secondary | ICD-10-CM | POA: Diagnosis not present

## 2018-03-23 DIAGNOSIS — E0843 Diabetes mellitus due to underlying condition with diabetic autonomic (poly)neuropathy: Secondary | ICD-10-CM

## 2018-03-23 DIAGNOSIS — M5416 Radiculopathy, lumbar region: Secondary | ICD-10-CM | POA: Diagnosis not present

## 2018-03-23 MED ORDER — CYCLOBENZAPRINE HCL 5 MG PO TABS: 5.0000 mg | ORAL_TABLET | Freq: Every day | ORAL | 1 refills | Status: DC

## 2018-03-23 MED ORDER — GENTAMICIN SULFATE 0.1 % EX CREA: 1.0000 "application " | TOPICAL_CREAM | Freq: Two times a day (BID) | CUTANEOUS | 0 refills | Status: DC

## 2018-03-23 NOTE — Patient Instructions (Signed)

## 2018-03-25 ENCOUNTER — Encounter: Payer: Self-pay | Admitting: Acute Care

## 2018-03-25 ENCOUNTER — Ambulatory Visit: Payer: Medicare HMO | Admitting: Acute Care

## 2018-03-25 DIAGNOSIS — R05 Cough: Secondary | ICD-10-CM

## 2018-03-25 DIAGNOSIS — J449 Chronic obstructive pulmonary disease, unspecified: Secondary | ICD-10-CM

## 2018-03-25 DIAGNOSIS — G4733 Obstructive sleep apnea (adult) (pediatric): Secondary | ICD-10-CM | POA: Diagnosis not present

## 2018-03-25 DIAGNOSIS — F17210 Nicotine dependence, cigarettes, uncomplicated: Secondary | ICD-10-CM | POA: Diagnosis not present

## 2018-03-25 DIAGNOSIS — Z9989 Dependence on other enabling machines and devices: Secondary | ICD-10-CM | POA: Diagnosis not present

## 2018-03-25 DIAGNOSIS — R69 Illness, unspecified: Secondary | ICD-10-CM | POA: Diagnosis not present

## 2018-03-25 DIAGNOSIS — R911 Solitary pulmonary nodule: Secondary | ICD-10-CM | POA: Diagnosis not present

## 2018-03-25 DIAGNOSIS — R059 Cough, unspecified: Secondary | ICD-10-CM | POA: Insufficient documentation

## 2018-03-25 DIAGNOSIS — Z720 Tobacco use: Secondary | ICD-10-CM

## 2018-03-25 NOTE — Assessment & Plan Note (Signed)
Congratulations on quitting smoking. It is important that you do not smoke at all. Let us know if there is anything we can do to help you remain smoke free. I have spent 4 minutes counseling patient on smoking cessation this visit. Patient verbalizes understanding of the importance to remain  Smoke free due to the  the negative health consequences including worsening of COPD, risk of lung cancer , stroke and heart disease.Marland Kitchen

## 2018-03-25 NOTE — Assessment & Plan Note (Signed)
For your cough Sips of water instead of throat clearing Sugar Free Jolly Ranchers or Werther's originals for throat soothing. Delsym Cough syrup 5 cc's every 12 hours Non-sedating antihistamine of your choice daily.

## 2018-03-25 NOTE — Assessment & Plan Note (Signed)
Recently started smoking again and then quit 02/28/2018 Plan: Will need follow up CT chest without contrast 11/2018

## 2018-03-25 NOTE — Assessment & Plan Note (Signed)
For Your COPD Continue your Singulair 10 mg daily Continue your Albuterol nebs as needed  Continue your Symbicort 2 puff twice daily Rinse mouth after use Spiriva 2 puffs every morning. Continue Daliresp one  500 mcg tablet daily Add Mucinex 1200 mg once to twice daily with a full glass of water to thin secretions. Note your daily symptoms > remember "red flags" for COPD:  Increase in cough, increase in sputum production, increase in shortness of breath or activity intolerance. If you notice these symptoms, please call to be seen.   Follow up with Judson Roch NP or Dr. Elsworth Soho in 3 months

## 2018-03-25 NOTE — Assessment & Plan Note (Signed)
For your OSA Continue on CPAP at bedtime. You appear to be benefiting from the treatment Goal is to wear for at least 6 hours each night for maximal clinical benefit. Continue to work on weight loss, as the link between excess weight  and sleep apnea is well established.  Do not drive if sleepy. Remember to clean mask, tubing, filter, and reservoir once weekly with soapy water.

## 2018-03-25 NOTE — Progress Notes (Addendum)
History of Present Illness Joan [name] is a 58 y.o. female current every day smoker  with COPD, steroid induced DM and OSA. She is followed by Dr. Elsworth Soho.   03/25/2018  3 Month Follow up: Pt. Presents for follow up. She was hospitalized 12/28/2017  for 3 days for COPD exacerbation and treated with Levaquin and prednisone.  She is compliant with oxygen.She had started smoking again after her hospitalization and was smoking 1 PPD. She was about to start Wellbutrin in 12/2017 after seeing Dr. Elsworth Soho.She had reported some upper thoracic back pain , non-radiating, which had been impacting her breathing.She had relief with Naproxen.  Dr. Elsworth Soho reviewed her 11/2017 CT scan with the patient, which showed no evidence of vertebral fracture.  01/19/2018 She called the office requesting a prednisone taper  [name] presents today for follow up. She states she is compliant with her Albuterol nebs, Combivent, and Singulair. She states she did complete the prednisone taper that Dr. Elsworth Soho called in November 25th. She states she has been doing well. She has a cough with thick white secretions. She states it is hard to cough up the secretions.She is not using her Mucinex. She states she does have dyspnea exertion . She is compliant with her Symbicort, Spiriva, Singulair,Albuterol nebs ( twice daily) and Daliresp.  She had surgery on her hip and since then she has had a limp. She states she uses a wheel chair when she has to walk long distances. She denies fever, chest pain, orthopnea or hemoptysis.  Significant tests/ events reviewed  CPAP Down Load 02/24/2018 AirSense 10 AutoSet Set Pressure of 10 cm H2O Usage Days 30/30 days > 4 hours 28 days < 4 hours 2 days  Average Usage 8 hours 59 minutes  AHI 3.3  CXR 12/28/2017>>  IMPRESSION: COPD without acute abnormality.  CT Chest without 11/2017 Stable 4 mm right upper lobe nodule and 2 mm left upper lobe nodule. A previously demonstrated 3 mm left upper lobe nodule  is no longer seen. No follow-up needed if patient is low-risk (and has no known or suspected primary neoplasm). Non-contrast chest CT can be considered in 12 months if patient is high-risk.  Changes of COPD with centrilobular emphysema. Calcific coronary artery and aortic atherosclerosis.    NPSG 02/2017 >> no OSA HST 04/2017 15/h  10/2016 PFTs showed ratio 49, FEV1 of 36%, FVC 58% and DLCO of 34% consistent with severe obstruction.  11/2016 CT chest bullous changes BL, 67mm RUl & 2 mm LUL  nodules >> stable 11/2017   09/2016 echo normal.  ONO10/2018 on CPAP/room air showed desaturation for 1 hour 45 minutes started on nocturnal oxygen>>  CBC Latest Ref Rng & Units 12/30/2017 12/29/2017 12/28/2017  WBC 4.0 - 10.5 K/uL 17.3(H) 13.9(H) 19.2(H)  Hemoglobin 12.0 - 15.0 g/dL 10.6(L) 11.1(L) 11.7(L)  Hematocrit 36.0 - 46.0 % 34.7(L) 35.2(L) 37.9  Platelets 150 - 400 K/uL 282 273 361    BMP Latest Ref Rng & Units 12/30/2017 12/29/2017 12/28/2017  Glucose 70 - 99 mg/dL 203(H) 362(H) 143(H)  BUN 6 - 20 mg/dL 24(H) 20 18  Creatinine 0.44 - 1.00 mg/dL 1.23(H) 1.31(H) 1.36(H)  Sodium 135 - 145 mmol/L 140 140 141  Potassium 3.5 - 5.1 mmol/L 4.5 3.4(L) 3.6  Chloride 98 - 111 mmol/L 104 106 105  CO2 22 - 32 mmol/L 32 24 26  Calcium 8.9 - 10.3 mg/dL 9.0 8.3(L) 8.8(L)    BNP    Component Value Date/Time   BNP 114.5 (  H) 12/29/2017 0609    ProBNP No results found for: PROBNP  PFT    Component Value Date/Time   FEV1PRE 1.05 10/30/2016 0847   FEV1POST 1.23 10/30/2016 0847   FVCPRE 2.16 10/30/2016 0847   FVCPOST 2.53 10/30/2016 0847   TLC 5.36 10/30/2016 0847   DLCOUNC 9.40 10/30/2016 0847   PREFEV1FVCRT 49 10/30/2016 0847   PSTFEV1FVCRT 49 10/30/2016 0847    No results found.   Past medical hx Past Medical History:  Diagnosis Date  . Acute respiratory failure with hypoxia ([place])   . Alcoholism and drug addiction in family   . Anxiety   . Arthritis    "knees, hips, lower  back" (09/25/2016)  . Asthma   . Bipolar disorder ([place])   . Chronic bronchitis ([place])    "all the time" (09/25/2016)  . Chronic leg pain    RLE  . Chronic lower back pain   . COPD (chronic obstructive pulmonary disease) ([place])   . Depression   . Emphysema of lung ([place])   . GERD (gastroesophageal reflux disease)   . Headache   . Hepatitis C    "treated back in 2001-2002"  . High cholesterol   . Hypertension   . Hypothyroidism   . Incarcerated ventral hernia 04/04/2017  . On home oxygen therapy    "2L; at nighttime" (04/04/2017)  . OSA on CPAP   . Pneumonia    "all the time" (09/25/2016)  . Positive PPD    with negative chest x ray  . Tachycardia    patient reports with exertion ; denies any other conditions   . Thyroid disease      Social History   Tobacco Use  . Smoking status: Former Smoker    Packs/day: 1.00    Years: 44.00    Pack years: 44.00    Types: Cigarettes    Last attempt to quit: 02/28/2018    Years since quitting: 0.0  . Smokeless tobacco: Never Used  . Tobacco comment: Just quit again after restarting in NOvember.  Substance Use Topics  . Alcohol use: Yes  . Drug use: Yes    Types: "Crack" cocaine    Comment: 04/04/2017 "nothing since 01/12/2012"    Ms.Reine reports that she quit smoking about 3 weeks ago. Her smoking use included cigarettes. She has a 44.00 pack-year smoking history. She has never used smokeless tobacco. She reports current alcohol use. She reports current drug use. Drug: "Crack" cocaine.  Tobacco Cessation: Former smoker quit 02/28/2018  Past surgical hx, Family hx, Social hx all reviewed.  Current Outpatient Medications on File Prior to Visit  Medication Sig  . albuterol (PROVENTIL HFA;VENTOLIN HFA) 108 (90 Base) MCG/ACT inhaler USE 2 INHALATIONS ORALLY INTO THE LUNGS EVERY 6 HOURS AS NEEDED FOR WHEEZING OR SHORTNESS OF BREATH (BEFORE ACTIVITY)  . albuterol (PROVENTIL) (2.5 MG/3ML) 0.083% nebulizer solution Take 3 mLs (2.5 mg total) by  nebulization 2 (two) times daily. And every 6 hours as needed  . atenolol (TENORMIN) 25 MG tablet Take 1 tablet (25 mg total) by mouth daily.  Marland Kitchen atorvastatin (LIPITOR) 20 MG tablet Take 1 tablet (20 mg total) by mouth daily.  . baclofen (LIORESAL) 10 MG tablet Take 1 tablet (10 mg total) by mouth at bedtime.  . betamethasone valerate ointment (VALISONE) 0.1 % USE 1 APPLICATION TOPICALLYTWO TIMES A DAY  . buPROPion (WELLBUTRIN XL) 150 MG 24 hr tablet TAKE 1 TABLET BY MOUTH EVERY DAY  . cyclobenzaprine (FLEXERIL) 5 MG tablet Take 1 tablet (  5 mg total) by mouth at bedtime.  Marland Kitchen DALIRESP 500 MCG TABS tablet TAKE 1 TABLET DAILY  . DALIRESP 500 MCG TABS tablet TAKE 1 TABLET DAILY  . diphenhydrAMINE (BENADRYL) 25 MG tablet Take 50 mg by mouth daily.  Marland Kitchen gentamicin cream (GARAMYCIN) 0.1 % Apply 1 application topically 2 (two) times daily.  . hydrochlorothiazide (HYDRODIURIL) 25 MG tablet Take 1 tablet (25 mg total) by mouth daily.  . Ipratropium-Albuterol (COMBIVENT RESPIMAT) 20-100 MCG/ACT AERS respimat Inhale 1 puff into the lungs every 6 (six) hours as needed for wheezing.  Marland Kitchen levothyroxine (SYNTHROID, LEVOTHROID) 25 MCG tablet Take 1 tablet (25 mcg total) by mouth daily before breakfast.  . methylPREDNISolone (MEDROL) 4 MG TBPK tablet 1 pack-take as directed  . montelukast (SINGULAIR) 10 MG tablet Take 1 tablet (10 mg total) by mouth at bedtime.  . naproxen (NAPROSYN) 500 MG tablet TAKE 1 TABLET TWICE DAILY  WITH MEALS  . nystatin-triamcinolone ointment (MYCOLOG) Apply 1 application topically 2 (two) times daily as needed (applied to skin folds/groins/under breast skin irritation rash.).  Marland Kitchen OXYGEN Inhale 2 L into the lungs daily.  . pantoprazole (PROTONIX) 40 MG tablet Take 1 tablet (40 mg total) by mouth daily. (Patient taking differently: Take 40 mg by mouth 2 (two) times daily. )  . pregabalin (LYRICA) 75 MG capsule Take 1 capsule (75 mg total) by mouth 2 (two) times daily.  Marland Kitchen SPIRIVA RESPIMAT 2.5  MCG/ACT AERS Inhale 2 puffs into the lungs every morning.  . SYMBICORT 160-4.5 MCG/ACT inhaler Inhale 2 puffs into the lungs 2 (two) times daily.  . traZODone (DESYREL) 100 MG tablet Take 1 tablet (100 mg total) by mouth at bedtime.  . valACYclovir (VALTREX) 500 MG tablet Take 1 tablet (500 mg total) by mouth daily.  Marland Kitchen venlafaxine XR (EFFEXOR-XR) 150 MG 24 hr capsule TAKE 1 CAPSULE DAILY WITH  BREAKFAST (Patient taking differently: Take 150 mg by mouth daily with breakfast. )   No current facility-administered medications on file prior to visit.      Allergies  Allergen Reactions  . Keflex [Cephalexin] Other (See Comments)    unspecified  . Prednisone Other (See Comments)    "counter reacts"  . Shellfish Allergy Other (See Comments)    Stomach cramps  . Tetracyclines & Related Other (See Comments)    unspecified  . Dilaudid [Hydromorphone Hcl] Other (See Comments)    "Lethargy"  . Incruse Ellipta [Umeclidinium Bromide] Nausea Only  . Tuberculin Tests Other (See Comments)    Review Of Systems:  Constitutional:   No  weight loss, night sweats,  Fevers, chills, fatigue, or  lassitude.  HEENT:   No headaches,  Difficulty swallowing,  Tooth/dental problems, or  Sore throat,                No sneezing, itching, ear ache, + nasal congestion, + post nasal drip,   CV:  No chest pain,  Orthopnea, PND, swelling in lower extremities, anasarca, dizziness, palpitations, syncope.   GI  No heartburn, indigestion, abdominal pain, nausea, vomiting, diarrhea, change in bowel habits, loss of appetite, bloody stools.   Resp: + shortness of breath with exertion less at rest.  + excess mucus, + productive cough primarily in the morning,  No non-productive cough,  No coughing up of blood.  No change in color of mucus.  No wheezing.  No chest wall deformity  Skin: Rash per right hand, warm and dry  GU: no dysuria, change in color of urine, no urgency  or frequency.  No flank pain, no hematuria   MS:   + hip  pain no  swelling.  + decreased range of motion.  + back pain.  Psych:  No change in mood or affect. No depression or anxiety.  No memory loss.   Vital Signs BP 102/68 (BP Location: Left Arm, Cuff Size: Large)   Pulse 72   Ht 5\' 8"  (1.727 m)   Wt 219 lb (99.3 kg)   SpO2 94%   BMI 33.30 kg/m    Physical Exam:  General- No distress,  A&Ox3, pleasant ENT: No sinus tenderness, TM clear, pale nasal mucosa, no oral exudate,no post nasal drip, no LAN, No JVD Cardiac: S1, S2, regular rate and rhythm, no murmur Chest: No wheeze/ rales/ dullness; no accessory muscle use, no nasal flaring, no sternal retractions Abd.: Soft Non-tender, ND, NT,  BS +, Body mass index is 33.3 kg/m. Ext: No clubbing cyanosis, no edema Neuro: Cranial nerves intact, MAE x 4, A&O x 3, uses wheel chair when having to walk long distance, limps due to recent hip surgery Skin: + Right  rashes, warm and dry Psych: normal mood and behavior   Assessment/Plan  COPD, severe (HCC) For Your COPD Continue your Singulair 10 mg daily Continue your Albuterol nebs as needed  Continue your Symbicort 2 puff twice daily Rinse mouth after use Spiriva 2 puffs every morning. Continue Daliresp one  500 mcg tablet daily Add Mucinex 1200 mg once to twice daily with a full glass of water to thin secretions. Note your daily symptoms > remember "red flags" for COPD:  Increase in cough, increase in sputum production, increase in shortness of breath or activity intolerance. If you notice these symptoms, please call to be seen.   Follow up with Judson Roch NP or Dr. Elsworth Soho in 3 months     OSA on CPAP For your OSA Continue on CPAP at bedtime. You appear to be benefiting from the treatment Goal is to wear for at least 6 hours each night for maximal clinical benefit. Continue to work on weight loss, as the link between excess weight  and sleep apnea is well established.  Do not drive if sleepy. Remember to clean mask, tubing,  filter, and reservoir once weekly with soapy water.     Cough For your cough Sips of water instead of throat clearing Sugar Free Jolly Ranchers or Werther's originals for throat soothing. Delsym Cough syrup 5 cc's every 12 hours Non-sedating antihistamine of your choice daily.    Pulmonary nodule Recently started smoking again and then quit 02/28/2018 Plan: Will need follow up CT chest without contrast 11/2018  Tobacco abuse Congratulations on quitting smoking. It is important that you do not smoke at all. Let us know if there is anything we can do to help you remain smoke free. I have spent 4 minutes counseling patient on smoking cessation this visit. Patient verbalizes understanding of the importance to remain  Smoke free due to the  the negative health consequences including worsening of COPD, risk of lung cancer , stroke and heart disease..    General Follow Up Items Follow up with your PCP about the rash on your hand. ( Scheduled 03/26/2018) Follow up appointment in 3 months with Judson Roch NP or Dr. Elsworth Soho. Please contact office for sooner follow up if symptoms do not improve or worsen or seek emergency care    [name], NP 03/25/2018  4:16 PM

## 2018-03-25 NOTE — Patient Instructions (Addendum)
It is good to see you again  For Your COPD Continue your Singulair 10 mg daily Continue your Albuterol nebs as needed  Continue your Symbicort 2 puff twice daily Rinse mouth after use Spiriva 2 puffs every morning. Continue Daliresp one  500 mcg tablet daily Add Mucinex 1200 mg once to twice daily with a full glass of water to thin secretions. Note your daily symptoms > remember "red flags" for COPD:  Increase in cough, increase in sputum production, increase in shortness of breath or activity intolerance. If you notice these symptoms, please call to be seen.    For your cough Sips of water instead of throat clearing Sugar Free Jolly Ranchers or Werther's originals for throat soothing. Delsym Cough syrup 5 cc's every 12 hours Non-sedating antihistamine of your choice daily.  For your OSA Continue on CPAP at bedtime. You appear to be benefiting from the treatment Goal is to wear for at least 6 hours each night for maximal clinical benefit. Continue to work on weight loss, as the link between excess weight  and sleep apnea is well established.  Do not drive if sleepy. Remember to clean mask, tubing, filter, and reservoir once weekly with soapy water.   General Follow up: Follow up with your PCP about the rash on your hand. ( Scheduled 03/26/2018) Follow up appointment in 3 months with Judson Roch NP or Dr. Elsworth Soho. Please contact office for sooner follow up if symptoms do not improve or worsen or seek emergency care

## 2018-03-26 ENCOUNTER — Encounter: Payer: Self-pay | Admitting: Internal Medicine

## 2018-03-26 ENCOUNTER — Ambulatory Visit (INDEPENDENT_AMBULATORY_CARE_PROVIDER_SITE_OTHER): Payer: Medicare HMO | Admitting: Internal Medicine

## 2018-03-26 VITALS — BP 106/70 | HR 74 | Temp 98.1°F | Ht 68.0 in | Wt 218.0 lb

## 2018-03-26 DIAGNOSIS — F419 Anxiety disorder, unspecified: Secondary | ICD-10-CM | POA: Diagnosis not present

## 2018-03-26 DIAGNOSIS — R0602 Shortness of breath: Secondary | ICD-10-CM

## 2018-03-26 DIAGNOSIS — R21 Rash and other nonspecific skin eruption: Secondary | ICD-10-CM | POA: Diagnosis not present

## 2018-03-26 DIAGNOSIS — Z720 Tobacco use: Secondary | ICD-10-CM | POA: Diagnosis not present

## 2018-03-26 DIAGNOSIS — J449 Chronic obstructive pulmonary disease, unspecified: Secondary | ICD-10-CM | POA: Diagnosis not present

## 2018-03-26 DIAGNOSIS — R69 Illness, unspecified: Secondary | ICD-10-CM | POA: Diagnosis not present

## 2018-03-26 MED ORDER — NYSTATIN 100000 UNIT/GM EX OINT: 1.0000 "application " | TOPICAL_OINTMENT | Freq: Two times a day (BID) | CUTANEOUS | 0 refills | Status: DC

## 2018-03-26 MED ORDER — CYCLOBENZAPRINE HCL 10 MG PO TABS: 10.0000 mg | ORAL_TABLET | Freq: Every day | ORAL | 1 refills | Status: DC

## 2018-03-26 NOTE — Progress Notes (Signed)
   Subjective:   Patient ID: Joan [name], female    DOB: 06/26/1960, 58 y.o.   MRN: [id number]  HPI The patient is a 58 YO female coming in for several concerns today including breathing (still getting SOB with walking, was wondering about more PT or electric scooter evaluation, we had talked about this previously and she was going to be traveling and could not commit to the PT evaluation, she does need to stay at home due to not being able to walk long distances, can get around within the home with cane only, denies worsening SOB or cough recently, has stopped smoking since beginning of January and hopes to stop for good this time), and rash (on her right hand, has tried cream on it we gave her, this helped for some time but came right back once cream was stopped, some pain in it, mild itching, trying not to scratch), and memory (she is concerned about dementia, she is doing counseling now to see if this will help, she is anxious some of the time, she is worried about her past drug use, mostly cocaine and stopped about 3-4 years ago per reports, never IV drug use, sometimes will forget what she is saying in middle of sentence, sometimes forgets people's names, denies problems with driving, denies shaking or seizure symptoms).   Review of Systems  Constitutional: Positive for activity change and fatigue.  HENT: Negative.   Eyes: Negative.   Respiratory: Positive for cough and shortness of breath. Negative for chest tightness.   Cardiovascular: Negative for chest pain, palpitations and leg swelling.  Gastrointestinal: Negative for abdominal distention, abdominal pain, constipation, diarrhea, nausea and vomiting.  Musculoskeletal: Positive for arthralgias and back pain.  Skin: Positive for rash. Negative for color change, pallor and wound.  Neurological: Negative.        Memory changes  Psychiatric/Behavioral: The patient is nervous/anxious.     Objective:  Physical Exam Constitutional:    Appearance: She is well-developed.     Comments: Appears older than stated age  HENT:     Head: Normocephalic and atraumatic.  Neck:     Musculoskeletal: Normal range of motion.  Cardiovascular:     Rate and Rhythm: Normal rate and regular rhythm.  Pulmonary:     Effort: Pulmonary effort is normal. No respiratory distress.     Breath sounds: Rhonchi present. No wheezing or rales.     Comments: Stable to mildly improved lung exam Abdominal:     General: Bowel sounds are normal. There is no distension.     Palpations: Abdomen is soft.     Tenderness: There is no abdominal tenderness. There is no rebound.  Skin:    General: Skin is warm and dry.     Comments: Rash on the right palm which appears to be about 3-4 cm rectangle area, no deep penetration to muscle or bone appreciated and no open wound and no fluctuance.  Neurological:     Mental Status: She is alert and oriented to person, place, and time.     Coordination: Coordination normal.     Vitals:   03/26/18 1407  BP: 106/70  Pulse: 74  Temp: 98.1 F (36.7 C)  TempSrc: Oral  SpO2: 94%  Weight: 218 lb (98.9 kg)  Height: 5\' 8"  (1.727 m)    Assessment & Plan:

## 2018-03-26 NOTE — Patient Instructions (Addendum)
We have sent in the cream for the hand.   We will get you in with the dermatologist.   We will get you in with the physical therapist to check what the best mobility option is for you.   We will check with the lung doctor and try to get you in with the pulmonary rehab program at the hospital.

## 2018-03-27 ENCOUNTER — Other Ambulatory Visit: Payer: Self-pay | Admitting: Internal Medicine

## 2018-03-27 DIAGNOSIS — J449 Chronic obstructive pulmonary disease, unspecified: Secondary | ICD-10-CM

## 2018-03-27 NOTE — Assessment & Plan Note (Signed)
Will ask her pulmonary provider if pulmonary rehab would be helpful for her as she is motivated and willing to do this. Also referral for PT for electric scooter evaluation.

## 2018-03-27 NOTE — Assessment & Plan Note (Signed)
She is working on stopping smoking and has been quit for a couple of weeks. Encouraged her to keep trying to quit and stay quit for good. She wants to have success with this.

## 2018-03-27 NOTE — Assessment & Plan Note (Signed)
Suspect that this could be playing a role in her memory problems although the history of drug use can also be part of the memory changes. We discussed these options at the visit and discussed waiting and seeing how therapy helps her with memory.

## 2018-03-27 NOTE — Assessment & Plan Note (Signed)
Suspect fungal component given recurrence once steroid cream stopped. Advised to use betamethasone cream to help with some inflammation. Rx for nystatin to use concurrently to help resolve. Referral to dermatology per patient request.

## 2018-03-29 NOTE — Progress Notes (Signed)
HPI: 58 year old female presenting today for follow up evaluation of nocturnal foot cramps. She states her symptoms are improving but she still has some burning sensations intermittently. Flexing the feet helps to alleviate the symptoms. She has been taking the Flexeril as directed. Patient is here for further evaluation and treatment.   Past Medical History:  Diagnosis Date  . Acute respiratory failure with hypoxia ([place])   . Alcoholism and drug addiction in family   . Anxiety   . Arthritis    "knees, hips, lower back" (09/25/2016)  . Asthma   . Bipolar disorder ([place])   . Chronic bronchitis ([place])    "all the time" (09/25/2016)  . Chronic leg pain    RLE  . Chronic lower back pain   . COPD (chronic obstructive pulmonary disease) ([place])   . Depression   . Emphysema of lung ([place])   . GERD (gastroesophageal reflux disease)   . Headache   . Hepatitis C    "treated back in 2001-2002"  . High cholesterol   . Hypertension   . Hypothyroidism   . Incarcerated ventral hernia 04/04/2017  . On home oxygen therapy    "2L; at nighttime" (04/04/2017)  . OSA on CPAP   . Pneumonia    "all the time" (09/25/2016)  . Positive PPD    with negative chest x ray  . Tachycardia    patient reports with exertion ; denies any other conditions   . Thyroid disease      Physical Exam: General: The patient is alert and oriented x3 in no acute distress.  Dermatology: Hyperkeratotic, discolored, thickened, onychodystrophy of the left great toenail. Skin is warm, dry and supple bilateral lower extremities. Negative for open lesions or macerations.  Vascular: Palpable pedal pulses bilaterally. No edema or erythema noted. Capillary refill within normal limits.  Neurological: Epicritic and protective threshold diminished bilaterally.   Musculoskeletal Exam: Range of motion within normal limits to all pedal and ankle joints bilateral. Muscle strength 5/5 in all groups bilateral.   Assessment: 1. Nocturnal foot  cramps 2. DM with polyneuropathy - uncontrolled  3. H/o lumbar stenosis 4. H/o bilateral hip replacements 5. Symptomatic dystrophic nail left hallux    Plan of Care:  1. Patient evaluated.  2. Discussed treatment alternatives and plan of care. Explained nail avulsion procedure and post procedure course to patient. 3. Patient opted for permanent total nail avulsion of the left hallux.  4. Prior to procedure, local anesthesia infiltration utilized using 3 ml of a 50:50 mixture of 2% plain lidocaine and 0.5% plain marcaine in a normal hallux block fashion and a betadine prep performed.  5. Total permanent nail avulsion with chemical matrixectomy performed using [id number] applications of phenol followed by alcohol flush.  6. Light dressing applied. 7. Continue taking Lyrica as directed by PCP.  8. Refill prescription for Flexeril 5 mg QHS PRN for nocturnal foot cramps provided to patient.  9. Prescription for Gentamicin cream provided to patient to use daily with a bandage.  10. Return to clinic in 3 weeks.      Edrick Kins, DPM [facility]  Dr. Edrick Kins, DPM    [address], [place] [address]  Office [phone]  Fax [phone]

## 2018-04-06 ENCOUNTER — Telehealth: Payer: Self-pay | Admitting: *Deleted

## 2018-04-06 ENCOUNTER — Ambulatory Visit (INDEPENDENT_AMBULATORY_CARE_PROVIDER_SITE_OTHER): Payer: Medicare Other | Admitting: *Deleted

## 2018-04-06 VITALS — BP 126/80 | HR 74 | Resp 17 | Ht 68.0 in | Wt 217.0 lb

## 2018-04-06 DIAGNOSIS — Z0000 Encounter for general adult medical examination without abnormal findings: Secondary | ICD-10-CM

## 2018-04-06 DIAGNOSIS — Z1211 Encounter for screening for malignant neoplasm of colon: Secondary | ICD-10-CM

## 2018-04-06 DIAGNOSIS — Z1231 Encounter for screening mammogram for malignant neoplasm of breast: Secondary | ICD-10-CM

## 2018-04-06 NOTE — Progress Notes (Signed)
Medical screening examination/treatment/procedure(s) were performed by non-physician practitioner and as supervising physician I was immediately available for consultation/collaboration. I agree with above. Ziggy Reveles A Shivangi Lutz, MD 

## 2018-04-06 NOTE — Patient Instructions (Addendum)
Continue doing brain stimulating activities (puzzles, reading, adult coloring books, staying active) to keep memory sharp.   Continue to eat heart healthy diet (full of fruits, vegetables, whole grains, lean protein, water--limit salt, fat, and sugar intake) and increase physical activity as tolerated.   Joan [name] , Thank you for taking time to come for your Medicare Wellness Visit. I appreciate your ongoing commitment to your health goals. Please review the following plan we discussed and let me know if I can assist you in the future.   These are the goals we discussed: Goals    . Patient Stated     Get involved socially with the senior centers.        This is a list of the screening recommended for you and due dates:  Health Maintenance  Topic Date Due  . Urine Protein Check  11/02/1971  . Tetanus Vaccine  11/01/1980  . Mammogram  11/02/2011  . Colon Cancer Screening  11/02/2011  . Pap Smear  04/06/2018*  . Flu Shot  Completed  .  Hepatitis C: One time screening is recommended by Center for Disease Control  (CDC) for  adults born from [birth year] through 1964.   Completed  . HIV Screening  Completed  *Topic was postponed. The date shown is not the original due date.   Health Maintenance, Female Adopting a healthy lifestyle and getting preventive care can go a long way to promote health and wellness. Talk with your health care provider about what schedule of regular examinations is right for you. This is a good chance for you to check in with your provider about disease prevention and staying healthy. In between checkups, there are plenty of things you can do on your own. Experts have done a lot of research about which lifestyle changes and preventive measures are most likely to keep you healthy. Ask your health care provider for more information. Weight and diet Eat a healthy diet  Be sure to include plenty of vegetables, fruits, low-fat dairy products, and lean protein.  Do not eat a  lot of foods high in solid fats, added sugars, or salt.  Get regular exercise. This is one of the most important things you can do for your health. ? Most adults should exercise for at least 150 minutes each week. The exercise should increase your heart rate and make you sweat (moderate-intensity exercise). ? Most adults should also do strengthening exercises at least twice a week. This is in addition to the moderate-intensity exercise. Maintain a healthy weight  Body mass index (BMI) is a measurement that can be used to identify possible weight problems. It estimates body fat based on height and weight. Your health care provider can help determine your BMI and help you achieve or maintain a healthy weight.  For females 62 years of age and older: ? A BMI below 18.5 is considered underweight. ? A BMI of 18.5 to 24.9 is normal. ? A BMI of 25 to 29.9 is considered overweight. ? A BMI of 30 and above is considered obese. Watch levels of cholesterol and blood lipids  You should start having your blood tested for lipids and cholesterol at 56 years of age, then have this test every 5 years.  You may need to have your cholesterol levels checked more often if: ? Your lipid or cholesterol levels are high. ? You are older than 57 years of age. ? You are at high risk for heart disease. Cancer screening Lung Cancer  Lung  cancer screening is recommended for adults 59-100 years old who are at high risk for lung cancer because of a history of smoking.  A yearly low-dose CT scan of the lungs is recommended for people who: ? Currently smoke. ? Have quit within the past 15 years. ? Have at least a 30-pack-year history of smoking. A pack year is smoking an average of one pack of cigarettes a day for 1 year.  Yearly screening should continue until it has been 15 years since you quit.  Yearly screening should stop if you develop a health problem that would prevent you from having lung cancer  treatment. Breast Cancer  Practice breast self-awareness. This means understanding how your breasts normally appear and feel.  It also means doing regular breast self-exams. Let your health care provider know about any changes, no matter how small.  If you are in your 20s or 30s, you should have a clinical breast exam (CBE) by a health care provider every 1-3 years as part of a regular health exam.  If you are 23 or older, have a CBE every year. Also consider having a breast X-ray (mammogram) every year.  If you have a family history of breast cancer, talk to your health care provider about genetic screening.  If you are at high risk for breast cancer, talk to your health care provider about having an MRI and a mammogram every year.  Breast cancer gene (BRCA) assessment is recommended for women who have family members with BRCA-related cancers. BRCA-related cancers include: ? Breast. ? Ovarian. ? Tubal. ? Peritoneal cancers.  Results of the assessment will determine the need for genetic counseling and BRCA1 and BRCA2 testing. Cervical Cancer Your health care provider may recommend that you be screened regularly for cancer of the pelvic organs (ovaries, uterus, and vagina). This screening involves a pelvic examination, including checking for microscopic changes to the surface of your cervix (Pap test). You may be encouraged to have this screening done every 3 years, beginning at age 37.  For women ages 42-65, health care providers may recommend pelvic exams and Pap testing every 3 years, or they may recommend the Pap and pelvic exam, combined with testing for human papilloma virus (HPV), every 5 years. Some types of HPV increase your risk of cervical cancer. Testing for HPV may also be done on women of any age with unclear Pap test results.  Other health care providers may not recommend any screening for nonpregnant women who are considered low risk for pelvic cancer and who do not have  symptoms. Ask your health care provider if a screening pelvic exam is right for you.  If you have had past treatment for cervical cancer or a condition that could lead to cancer, you need Pap tests and screening for cancer for at least 20 years after your treatment. If Pap tests have been discontinued, your risk factors (such as having a new sexual partner) need to be reassessed to determine if screening should resume. Some women have medical problems that increase the chance of getting cervical cancer. In these cases, your health care provider may recommend more frequent screening and Pap tests. Colorectal Cancer  This type of cancer can be detected and often prevented.  Routine colorectal cancer screening usually begins at 57 years of age and continues through 57 years of age.  Your health care provider may recommend screening at an earlier age if you have risk factors for colon cancer.  Your health care provider may  also recommend using home test kits to check for hidden blood in the stool.  A small camera at the end of a tube can be used to examine your colon directly (sigmoidoscopy or colonoscopy). This is done to check for the earliest forms of colorectal cancer.  Routine screening usually begins at age 54.  Direct examination of the colon should be repeated every 5-10 years through 57 years of age. However, you may need to be screened more often if early forms of precancerous polyps or small growths are found. Skin Cancer  Check your skin from head to toe regularly.  Tell your health care provider about any new moles or changes in moles, especially if there is a change in a mole's shape or color.  Also tell your health care provider if you have a mole that is larger than the size of a pencil eraser.  Always use sunscreen. Apply sunscreen liberally and repeatedly throughout the day.  Protect yourself by wearing long sleeves, pants, a wide-brimmed hat, and sunglasses whenever you are  outside. Heart disease, diabetes, and high blood pressure  High blood pressure causes heart disease and increases the risk of stroke. High blood pressure is more likely to develop in: ? People who have blood pressure in the high end of the normal range (130-139/85-89 mm Hg). ? People who are overweight or obese. ? People who are African American.  If you are 24-63 years of age, have your blood pressure checked every 3-5 years. If you are 5 years of age or older, have your blood pressure checked every year. You should have your blood pressure measured twice-once when you are at a hospital or clinic, and once when you are not at a hospital or clinic. Record the average of the two measurements. To check your blood pressure when you are not at a hospital or clinic, you can use: ? An automated blood pressure machine at a pharmacy. ? A home blood pressure monitor.  If you are between 64 years and 94 years old, ask your health care provider if you should take aspirin to prevent strokes.  Have regular diabetes screenings. This involves taking a blood sample to check your fasting blood sugar level. ? If you are at a normal weight and have a low risk for diabetes, have this test once every three years after 57 years of age. ? If you are overweight and have a high risk for diabetes, consider being tested at a younger age or more often. Preventing infection Hepatitis B  If you have a higher risk for hepatitis B, you should be screened for this virus. You are considered at high risk for hepatitis B if: ? You were born in a country where hepatitis B is common. Ask your health care provider which countries are considered high risk. ? Your parents were born in a high-risk country, and you have not been immunized against hepatitis B (hepatitis B vaccine). ? You have HIV or AIDS. ? You use needles to inject street drugs. ? You live with someone who has hepatitis B. ? You have had sex with someone who has  hepatitis B. ? You get hemodialysis treatment. ? You take certain medicines for conditions, including cancer, organ transplantation, and autoimmune conditions. Hepatitis C  Blood testing is recommended for: ? Everyone born from [birth year] through 1964. ? Anyone with known risk factors for hepatitis C. Sexually transmitted infections (STIs)  You should be screened for sexually transmitted infections (STIs) including gonorrhea and chlamydia  if: ? You are sexually active and are younger than 58 years of age. ? You are older than 57 years of age and your health care provider tells you that you are at risk for this type of infection. ? Your sexual activity has changed since you were last screened and you are at an increased risk for chlamydia or gonorrhea. Ask your health care provider if you are at risk.  If you do not have HIV, but are at risk, it may be recommended that you take a prescription medicine daily to prevent HIV infection. This is called pre-exposure prophylaxis (PrEP). You are considered at risk if: ? You are sexually active and do not regularly use condoms or know the HIV status of your partner(s). ? You take drugs by injection. ? You are sexually active with a partner who has HIV. Talk with your health care provider about whether you are at high risk of being infected with HIV. If you choose to begin PrEP, you should first be tested for HIV. You should then be tested every 3 months for as long as you are taking PrEP. Pregnancy  If you are premenopausal and you may become pregnant, ask your health care provider about preconception counseling.  If you may become pregnant, take 400 to 800 micrograms (mcg) of folic acid every day.  If you want to prevent pregnancy, talk to your health care provider about birth control (contraception). Osteoporosis and menopause  Osteoporosis is a disease in which the bones lose minerals and strength with aging. This can result in serious bone  fractures. Your risk for osteoporosis can be identified using a bone density scan.  If you are 28 years of age or older, or if you are at risk for osteoporosis and fractures, ask your health care provider if you should be screened.  Ask your health care provider whether you should take a calcium or vitamin D supplement to lower your risk for osteoporosis.  Menopause may have certain physical symptoms and risks.  Hormone replacement therapy may reduce some of these symptoms and risks. Talk to your health care provider about whether hormone replacement therapy is right for you. Follow these instructions at home:  Schedule regular health, dental, and eye exams.  Stay current with your immunizations.  Do not use any tobacco products including cigarettes, chewing tobacco, or electronic cigarettes.  If you are pregnant, do not drink alcohol.  If you are breastfeeding, limit how much and how often you drink alcohol.  Limit alcohol intake to no more than 1 drink per day for nonpregnant women. One drink equals 12 ounces of beer, 5 ounces of Amarra Sawyer, or 1 ounces of hard liquor.  Do not use street drugs.  Do not share needles.  Ask your health care provider for help if you need support or information about quitting drugs.  Tell your health care provider if you often feel depressed.  Tell your health care provider if you have ever been abused or do not feel safe at home. This information is not intended to replace advice given to you by your health care provider. Make sure you discuss any questions you have with your health care provider. Document Released: 08/27/2010 Document Revised: 07/20/2015 Document Reviewed: 11/15/2014 Elsevier Interactive Patient Education  2019 [employer].

## 2018-04-06 NOTE — Telephone Encounter (Signed)
Upon review of chart neither need refilled as both should have refills at pharmacy left.

## 2018-04-06 NOTE — Telephone Encounter (Signed)
During AWV, patient stated that she needs prescription refills for bupropion and trazodone.

## 2018-04-06 NOTE — Progress Notes (Signed)
Subjective:   Joan [name] is a 58 y.o. female who presents for an Initial Medicare Annual Wellness Visit.  Review of Systems    No ROS.  Medicare Wellness Visit. Additional risk factors are reflected in the social history.  Cardiac Risk Factors include: advanced age (>13men, >48 women);hypertension Sleep patterns: has difficulty falling asleep, has interrupted sleep, gets up 1-2 times nightly to void and sleeps hours varies nightly. Patient reports insomnia issues, discussed recommended sleep tips. Relevant patient education assigned to patient using Emmi.  Home Safety/Smoke Alarms: Feels safe in home. Smoke alarms in place.  Living environment; residence and Firearm Safety: 1-story house/ trailer, no firearms. Lives with siblings, no needs for DME, good support system Seat Belt Safety/Bike Helmet: Wears seat belt.      Objective:    Today's Vitals   04/06/18 1438 04/06/18 1533  BP: 126/80   Pulse: 74   Resp: 17   SpO2: 96%   Weight: 217 lb (98.4 kg)   Height: 5\' 8"  (1.727 m)   PainSc:  3    Body mass index is 32.99 kg/m.  Advanced Directives 04/06/2018 12/29/2017 07/07/2017 06/30/2017 06/26/2017 06/24/2017 06/23/2017  Does Patient Have a Medical Advance Directive? Yes Yes No No No Yes Yes  Type of Astronomer Power of Attorney - - - - -  Does patient want to make changes to medical advance directive? - No - Patient declined No - Patient declined No - Patient declined No - Patient declined No - Patient declined No - Patient declined  Copy of [place] in Chart? No - copy requested - - - - - -  Would patient like information on creating a medical advance directive? Yes (ED - Information included in AVS) - No - Patient declined No - Patient declined No - Patient declined - -    Current Medications (verified) Outpatient Encounter Medications as of 04/06/2018  Medication Sig  . albuterol (PROVENTIL) (2.5 MG/3ML) 0.083%  nebulizer solution Take 3 mLs (2.5 mg total) by nebulization 2 (two) times daily. And every 6 hours as needed  . atenolol (TENORMIN) 25 MG tablet Take 1 tablet (25 mg total) by mouth daily.  Marland Kitchen atorvastatin (LIPITOR) 20 MG tablet Take 1 tablet (20 mg total) by mouth daily.  . baclofen (LIORESAL) 10 MG tablet Take 1 tablet (10 mg total) by mouth at bedtime.  . betamethasone valerate ointment (VALISONE) 0.1 % USE 1 APPLICATION TOPICALLYTWO TIMES A DAY  . buPROPion (WELLBUTRIN XL) 150 MG 24 hr tablet TAKE 1 TABLET BY MOUTH EVERY DAY  . cyclobenzaprine (FLEXERIL) 10 MG tablet Take 1 tablet (10 mg total) by mouth at bedtime.  Marland Kitchen DALIRESP 500 MCG TABS tablet TAKE 1 TABLET DAILY  . diphenhydrAMINE (BENADRYL) 25 MG tablet Take 50 mg by mouth daily.  Marland Kitchen gentamicin cream (GARAMYCIN) 0.1 % Apply 1 application topically 2 (two) times daily.  . hydrochlorothiazide (HYDRODIURIL) 25 MG tablet Take 1 tablet (25 mg total) by mouth daily.  . Ipratropium-Albuterol (COMBIVENT RESPIMAT) 20-100 MCG/ACT AERS respimat Inhale 1 puff into the lungs every 6 (six) hours as needed for wheezing.  Marland Kitchen levothyroxine (SYNTHROID, LEVOTHROID) 25 MCG tablet Take 1 tablet (25 mcg total) by mouth daily before breakfast.  . montelukast (SINGULAIR) 10 MG tablet Take 1 tablet (10 mg total) by mouth at bedtime.  . naproxen (NAPROSYN) 500 MG tablet TAKE 1 TABLET TWICE DAILY  WITH MEALS  . nystatin-triamcinolone ointment (MYCOLOG) Apply 1 application  topically 2 (two) times daily as needed (applied to skin folds/groins/under breast skin irritation rash.).  Marland Kitchen OXYGEN Inhale 2 L into the lungs daily.  . pantoprazole (PROTONIX) 40 MG tablet Take 1 tablet (40 mg total) by mouth daily. (Patient taking differently: Take 40 mg by mouth 2 (two) times daily. )  . pregabalin (LYRICA) 75 MG capsule Take 1 capsule (75 mg total) by mouth 2 (two) times daily.  . SYMBICORT 160-4.5 MCG/ACT inhaler Inhale 2 puffs into the lungs 2 (two) times daily.  . traZODone  (DESYREL) 100 MG tablet Take 1 tablet (100 mg total) by mouth at bedtime.  . valACYclovir (VALTREX) 500 MG tablet Take 1 tablet (500 mg total) by mouth daily.  Marland Kitchen venlafaxine XR (EFFEXOR-XR) 150 MG 24 hr capsule TAKE 1 CAPSULE DAILY WITH  BREAKFAST (Patient taking differently: Take 150 mg by mouth daily with breakfast. )  . [DISCONTINUED] albuterol (PROVENTIL HFA;VENTOLIN HFA) 108 (90 Base) MCG/ACT inhaler USE 2 INHALATIONS ORALLY INTO THE LUNGS EVERY 6 HOURS AS NEEDED FOR WHEEZING OR SHORTNESS OF BREATH (BEFORE ACTIVITY)  . SPIRIVA RESPIMAT 2.5 MCG/ACT AERS Inhale 2 puffs into the lungs every morning. (Patient not taking: Reported on 04/06/2018)  . [DISCONTINUED] DALIRESP 500 MCG TABS tablet TAKE 1 TABLET DAILY (Patient not taking: Reported on 04/06/2018)  . [DISCONTINUED] methylPREDNISolone (MEDROL) 4 MG TBPK tablet 1 pack-take as directed (Patient not taking: Reported on 04/06/2018)  . [DISCONTINUED] nystatin ointment (MYCOSTATIN) Apply 1 application topically 2 (two) times daily. (Patient not taking: Reported on 04/06/2018)   No facility-administered encounter medications on file as of 04/06/2018.     Allergies (verified) Keflex [cephalexin]; Prednisone; Shellfish allergy; Tetracyclines & related; Dilaudid [hydromorphone hcl]; Incruse ellipta [umeclidinium bromide]; and Tuberculin tests   History: Past Medical History:  Diagnosis Date  . Acute respiratory failure with hypoxia ([place])   . Alcoholism and drug addiction in family   . Anxiety   . Arthritis    "knees, hips, lower back" (09/25/2016)  . Asthma   . Bipolar disorder ([place])   . Chronic bronchitis ([place])    "all the time" (09/25/2016)  . Chronic leg pain    RLE  . Chronic lower back pain   . COPD (chronic obstructive pulmonary disease) ([place])   . Depression   . Emphysema of lung ([place])   . GERD (gastroesophageal reflux disease)   . Headache   . Hepatitis C    "treated back in 2001-2002"  . High cholesterol   . Hypertension   .  Hypothyroidism   . Incarcerated ventral hernia 04/04/2017  . On home oxygen therapy    "2L; at nighttime" (04/04/2017)  . OSA on CPAP   . Pneumonia    "all the time" (09/25/2016)  . Positive PPD    with negative chest x ray  . Tachycardia    patient reports with exertion ; denies any other conditions   . Thyroid disease    Past Surgical History:  Procedure Laterality Date  . BACK SURGERY    . CARDIAC CATHETERIZATION    . COLONOSCOPY    . CYST EXCISION     removal of cyst in sinuses  . DILATION AND CURETTAGE OF UTERUS    . FOOT SURGERY Bilateral    bone removed from 4th and 5 th toes and put in pin then took pin out  . HERNIA REPAIR    . INCISION AND DRAINAGE  ~ 2014/2015   "removed mesh & infection"  . INCISIONAL HERNIA REPAIR N/A 04/04/2017  Procedure: LAPAROSCOPIC INCISIONAL HERNIA REPAIR WITH MESH;  Surgeon: Fanny Skates, MD;  Location: [facility];  Service: General;  Laterality: N/A;  . LACERATION REPAIR Right    repair wrist artery from laceration from ice skate  . LAPAROSCOPIC INCISIONAL / UMBILICAL / VENTRAL HERNIA REPAIR  04/04/2017   LAPAROSCOPIC INCISIONAL HERNIA REPAIR WITH MESH  . LIPOMA EXCISION Right    fattty lipoma in neck  . NASAL SEPTUM SURGERY    . POSTERIOR LUMBAR FUSION  2015/2016 X 2   "added rods and screws"  . TOTAL HIP ARTHROPLASTY Right 02/05/2017   Procedure: RIGHT TOTAL HIP ARTHROPLASTY;  Surgeon: [name], MD;  Location: [facility];  Service: Orthopedics;  Laterality: Right;  . TOTAL HIP ARTHROPLASTY Left 06/18/2017   Procedure: LEFT TOTAL HIP ARTHROPLASTY;  Surgeon: [name], MD;  Location: [facility];  Service: Orthopedics;  Laterality: Left;  . TUBAL LIGATION    . UMBILICAL HERNIA REPAIR  ~ 2013   w/mesh  . UPPER GI ENDOSCOPY     Family History  Problem Relation Age of Onset  . Diabetes Mother   . Hypertension Mother   . Hypertension Father   . Cancer Father        lung   Social History   Socioeconomic History  . Marital status:  Widowed    Spouse name: Not on file  . Number of children: 3  . Years of education: Not on file  . Highest education level: Not on file  Occupational History  . Not on file  Social Needs  . Financial resource strain: Somewhat hard  . Food insecurity:    Worry: Never true    Inability: Never true  . Transportation needs:    Medical: No    Non-medical: No  Tobacco Use  . Smoking status: Former Smoker    Packs/day: 1.00    Years: 44.00    Pack years: 44.00    Types: Cigarettes    Last attempt to quit: 02/28/2018    Years since quitting: 0.1  . Smokeless tobacco: Never Used  . Tobacco comment: Just quit again after restarting in NOvember.  Substance and Sexual Activity  . Alcohol use: Yes  . Drug use: Yes    Types: "Crack" cocaine    Comment: 04/04/2017 "nothing since 01/12/2012"  . Sexual activity: Never  Lifestyle  . Physical activity:    Days per week: 0 days    Minutes per session: 0 min  . Stress: To some extent  Relationships  . Social connections:    Talks on phone: Three times a week    Gets together: Once a week    Attends religious service: Never    Active member of club or organization: No    Attends meetings of clubs or organizations: Never    Relationship status: Widowed  Other Topics Concern  . Not on file  Social History Narrative  . Not on file    Tobacco Counseling Counseling given: Not Answered Comment: Just quit again after restarting in NOvember.  Activities of Daily Living In your present state of health, do you have any difficulty performing the following activities: 04/06/2018 12/29/2017  Hearing? N -  [place]? N -  Difficulty concentrating or making decisions? N -  Walking or climbing stairs? Y -  Dressing or bathing? N -  Doing errands, shopping? Y N  Preparing Food and eating ? N -  Using the Toilet? N -  In the past six months,  have you accidently leaked urine? N -  Do you have problems with loss of bowel control? N -    Managing your Medications? N -  Managing your Finances? N -  Housekeeping or managing your Housekeeping? N -  Some recent data might be hidden     Immunizations and Health Maintenance Immunization History  Administered Date(s) Administered  . Influenza,inj,Quad PF,6+ Mos 11/25/2016, 11/11/2017   Health Maintenance Due  Topic Date Due  . URINE MICROALBUMIN  11/02/1971  . TETANUS/TDAP  11/01/1980  . MAMMOGRAM  11/02/2011  . COLONOSCOPY  11/02/2011    Patient Care Team: [name], MD as PCP - General (Internal Medicine)  Indicate any recent Medical Services you may have received from other than [facility] providers in the past year (date may be approximate).     Assessment:   This is a routine wellness examination for Julita. Physical assessment deferred to PCP.  Hearing/Vision screen Hearing Screening Comments: Able to hear conversational tones w/o difficulty. No issues reported.  Passed whisper test Vision Screening Comments: Patient does not go the ophthalmologist annually. Education provided regarding importance of annual eye exams and eye health. Patient states she will make an eye appointment now that her insurance will cover the cost.   Dietary issues and exercise activities discussed: Current Exercise Habits: The patient does not participate in regular exercise at present(Discussed Chair exercises), Exercise limited by: orthopedic condition(s);respiratory conditions(s)  Diet (meal preparation, eat out, water intake, caffeinated beverages, dairy products, fruits and vegetables): in general, a "healthy" diet  , on average, 1 meals per day   Reviewed heart healthy diet. Encouraged patient to increase daily water and healthy fluid intake.  Discussed supplementing with Ensure and try not to skip meals, samples and coupons provided.    Goals    . Patient Stated     Get involved socially with the senior centers.       Depression Screen PHQ 2/9 Scores 04/06/2018  11/28/2016  PHQ - 2 Score 2 1  PHQ- 9 Score 6 -    Fall Risk Fall Risk  04/06/2018  Falls in the past year? 0  Risk for fall due to : Impaired balance/gait;Impaired mobility  Follow up Falls prevention discussed    Cognitive Function: MMSE - Mini Mental State Exam 04/06/2018  Orientation to time 5  Orientation to Place 5  Registration 3  Attention/ Calculation 4  Recall 3  Language- name 2 objects 2  Language- repeat 1  Language- follow 3 step command 3  Language- read & follow direction 1  Write a sentence 1  Copy design 1  Total score 29       Screening Tests Health Maintenance  Topic Date Due  . URINE MICROALBUMIN  11/02/1971  . TETANUS/TDAP  11/01/1980  . MAMMOGRAM  11/02/2011  . COLONOSCOPY  11/02/2011  . PAP SMEAR-Modifier  04/06/2018 (Originally 11/02/1982)  . INFLUENZA VACCINE  Completed  . Hepatitis C Screening  Completed  . HIV Screening  Completed     Plan:     Reviewed health maintenance screenings with patient today and relevant education, vaccines, and/or referrals were provided.   Continue doing brain stimulating activities (puzzles, reading, adult coloring books, staying active) to keep memory sharp.   Continue to eat heart healthy diet (full of fruits, vegetables, whole grains, lean protein, water--limit salt, fat, and sugar intake) and increase physical activity as tolerated.  I have personally reviewed and noted the following in the patient's chart:   .  Medical and social history . Use of alcohol, tobacco or illicit drugs  . Current medications and supplements . Functional ability and status . Nutritional status . Physical activity . Advanced directives . List of other physicians . Vitals . Screenings to include cognitive, depression, and falls . Referrals and appointments  In addition, I have reviewed and discussed with patient certain preventive protocols, quality metrics, and best practice recommendations. A written personalized care  plan for preventive services as well as general preventive health recommendations were provided to patient.     Michiel Cowboy, RN   04/06/2018

## 2018-04-07 ENCOUNTER — Other Ambulatory Visit: Payer: Self-pay | Admitting: *Deleted

## 2018-04-07 NOTE — Telephone Encounter (Signed)
Called and notified patient that she has refills remaining for bupropion and trazodone. Patient verbalized understanding.

## 2018-04-10 ENCOUNTER — Encounter: Payer: Self-pay | Admitting: Internal Medicine

## 2018-04-14 MED ORDER — LEVOTHYROXINE SODIUM 25 MCG PO TABS: 25.0000 ug | ORAL_TABLET | Freq: Every day | ORAL | 3 refills | Status: DC

## 2018-04-14 MED ORDER — PREGABALIN 75 MG PO CAPS: 75.0000 mg | ORAL_CAPSULE | Freq: Two times a day (BID) | ORAL | 1 refills | Status: DC

## 2018-04-14 MED ORDER — PANTOPRAZOLE SODIUM 40 MG PO TBEC: 40.0000 mg | DELAYED_RELEASE_TABLET | Freq: Every day | ORAL | 3 refills | Status: DC

## 2018-04-14 MED ORDER — ATENOLOL 25 MG PO TABS: 25.0000 mg | ORAL_TABLET | Freq: Every day | ORAL | 3 refills | Status: DC

## 2018-04-14 MED ORDER — ATORVASTATIN CALCIUM 20 MG PO TABS: 20.0000 mg | ORAL_TABLET | Freq: Every day | ORAL | 3 refills | Status: DC

## 2018-04-14 MED ORDER — HYDROCHLOROTHIAZIDE 25 MG PO TABS: 25.0000 mg | ORAL_TABLET | Freq: Every day | ORAL | 3 refills | Status: DC

## 2018-04-14 MED ORDER — CYCLOBENZAPRINE HCL 10 MG PO TABS: 10.0000 mg | ORAL_TABLET | Freq: Every day | ORAL | 1 refills | Status: DC

## 2018-04-14 MED ORDER — VALACYCLOVIR HCL 500 MG PO TABS: 500.0000 mg | ORAL_TABLET | Freq: Every day | ORAL | 3 refills | Status: DC

## 2018-04-14 MED ORDER — BUPROPION HCL ER (XL) 150 MG PO TB24: 150.0000 mg | ORAL_TABLET | Freq: Every day | ORAL | 2 refills | Status: DC

## 2018-04-14 MED ORDER — VENLAFAXINE HCL ER 150 MG PO CP24: ORAL_CAPSULE | ORAL | 3 refills | Status: DC

## 2018-04-14 MED ORDER — TRAZODONE HCL 100 MG PO TABS: 100.0000 mg | ORAL_TABLET | Freq: Every day | ORAL | 3 refills | Status: DC

## 2018-04-14 MED ORDER — NAPROXEN 500 MG PO TABS: 500.0000 mg | ORAL_TABLET | Freq: Two times a day (BID) | ORAL | 1 refills | Status: DC

## 2018-04-15 ENCOUNTER — Encounter: Payer: Self-pay | Admitting: Podiatry

## 2018-04-15 ENCOUNTER — Ambulatory Visit (INDEPENDENT_AMBULATORY_CARE_PROVIDER_SITE_OTHER): Payer: Medicare Other | Admitting: Podiatry

## 2018-04-15 DIAGNOSIS — M5416 Radiculopathy, lumbar region: Secondary | ICD-10-CM | POA: Diagnosis not present

## 2018-04-15 DIAGNOSIS — E0843 Diabetes mellitus due to underlying condition with diabetic autonomic (poly)neuropathy: Secondary | ICD-10-CM

## 2018-04-15 DIAGNOSIS — R252 Cramp and spasm: Secondary | ICD-10-CM | POA: Diagnosis not present

## 2018-04-15 DIAGNOSIS — L603 Nail dystrophy: Secondary | ICD-10-CM

## 2018-04-17 ENCOUNTER — Telehealth: Payer: Self-pay | Admitting: Internal Medicine

## 2018-04-17 MED ORDER — IPRATROPIUM-ALBUTEROL 20-100 MCG/ACT IN AERS: 1.0000 | INHALATION_SPRAY | Freq: Four times a day (QID) | RESPIRATORY_TRACT | 3 refills | Status: DC | PRN

## 2018-04-17 MED ORDER — SYMBICORT 160-4.5 MCG/ACT IN AERO: 2.0000 | INHALATION_SPRAY | Freq: Two times a day (BID) | RESPIRATORY_TRACT | 3 refills | Status: DC

## 2018-04-17 MED ORDER — ROFLUMILAST 500 MCG PO TABS: 500.0000 ug | ORAL_TABLET | Freq: Every day | ORAL | 3 refills | Status: DC

## 2018-04-17 MED ORDER — MONTELUKAST SODIUM 10 MG PO TABS: 10.0000 mg | ORAL_TABLET | Freq: Every day | ORAL | 3 refills | Status: DC

## 2018-04-17 MED ORDER — SPIRIVA RESPIMAT 2.5 MCG/ACT IN AERS: 2.0000 | INHALATION_SPRAY | RESPIRATORY_TRACT | 3 refills | Status: DC

## 2018-04-17 MED ORDER — ALBUTEROL SULFATE (2.5 MG/3ML) 0.083% IN NEBU: 2.5000 mg | INHALATION_SOLUTION | Freq: Two times a day (BID) | RESPIRATORY_TRACT | 3 refills | Status: DC

## 2018-04-17 NOTE — Telephone Encounter (Signed)
Patient has dropped off Physician's Verification form to be signed by Dr. Sharlet Salina in order to keep her electric on due to her oxygen tank.  Patient is requesting form to be sent back to [employer] by mail.  Please follow up with patient in regard.  Placing in Dr. Nathanial Millman basket.

## 2018-04-19 NOTE — Progress Notes (Signed)
   Subjective: Patient presents today 2 weeks post total permanent nail avulsion procedure of the left great toe. She states she is doing well. She reports some redness and tenderness but believes it is healing appropriately. She has been using Gentamicin cream for treatment. She denies modifying factors. Patient is here for further evaluation and treatment.   Past Medical History:  Diagnosis Date  . Acute respiratory failure with hypoxia ([place])   . Alcoholism and drug addiction in family   . Anxiety   . Arthritis    "knees, hips, lower back" (09/25/2016)  . Asthma   . Bipolar disorder ([place])   . Chronic bronchitis ([place])    "all the time" (09/25/2016)  . Chronic leg pain    RLE  . Chronic lower back pain   . COPD (chronic obstructive pulmonary disease) ([place])   . Depression   . Emphysema of lung ([place])   . GERD (gastroesophageal reflux disease)   . Headache   . Hepatitis C    "treated back in 2001-2002"  . High cholesterol   . Hypertension   . Hypothyroidism   . Incarcerated ventral hernia 04/04/2017  . On home oxygen therapy    "2L; at nighttime" (04/04/2017)  . OSA on CPAP   . Pneumonia    "all the time" (09/25/2016)  . Positive PPD    with negative chest x ray  . Tachycardia    patient reports with exertion ; denies any other conditions   . Thyroid disease     Objective: Skin is warm, dry and supple. Nail bed and respective nail fold appears to be healing appropriately. Open wound to the associated nail fold with a granular wound base and moderate amount of fibrotic tissue. Minimal drainage noted. Mild erythema around the periungual region likely due to phenol chemical matricectomy.  Assessment: #1 postop permanent total nail avulsion #2 open wound periungual nail fold and nail bed of respective digit.  #3 Nocturnal foot cramps #4 DM with polyneuropathy - uncontrolled  #5 h/o lumbar stenosis #6 h/o bilateral hip replacements  Plan of care: #1 patient was evaluated  #2  debridement of open wound was performed to the periungual border and nail fold of the respective toe using a currette. Antibiotic ointment and Band-Aid was applied. #3 Continue taking Lyrica and Flexeril as directed by PCP.  #4 patient is to return to clinic on a PRN  basis.   Edrick Kins, DPM [facility]  Dr. Edrick Kins, [address]                                        [place], [place] [address]                Office [phone]  Fax [phone]

## 2018-04-20 ENCOUNTER — Encounter: Payer: Self-pay | Admitting: Internal Medicine

## 2018-04-20 NOTE — Telephone Encounter (Signed)
Placed in Dr. Nathanial Millman folder to look over and sign

## 2018-04-20 NOTE — Telephone Encounter (Signed)
Dr. Sharlet Salina states paperwork should be taken to pulmonary doctor. LVM for patient informing of MD response and placed forms upfront for pick up

## 2018-04-22 ENCOUNTER — Other Ambulatory Visit: Payer: Self-pay

## 2018-04-22 MED ORDER — PANTOPRAZOLE SODIUM 40 MG PO TBEC: 40.0000 mg | DELAYED_RELEASE_TABLET | Freq: Every day | ORAL | 3 refills | Status: DC

## 2018-04-22 MED ORDER — ATENOLOL 25 MG PO TABS: 25.0000 mg | ORAL_TABLET | Freq: Every day | ORAL | 3 refills | Status: DC

## 2018-04-22 MED ORDER — ATORVASTATIN CALCIUM 20 MG PO TABS: 20.0000 mg | ORAL_TABLET | Freq: Every day | ORAL | 3 refills | Status: DC

## 2018-04-23 ENCOUNTER — Telehealth: Payer: Self-pay | Admitting: Internal Medicine

## 2018-04-23 NOTE — Telephone Encounter (Signed)
Copied from [place] [id number]. Topic: General - Other >> Apr 23, 2018  4:36 PM Keene Breath wrote: Reason for CRM: Patient called to speak with the nurse or doctor regarding her medications and some issues she has, which she sent messages over My Chart.  Please advise and call the patient back as soon as possible.  [phone]

## 2018-04-24 ENCOUNTER — Telehealth: Payer: Self-pay | Admitting: Pulmonary Disease

## 2018-04-24 NOTE — Telephone Encounter (Signed)
Finally got in touch with patient and she was calling to see if there were alternatives to a couple of her medications as she cannot afford them under her new insurance. Patient states that her lyrica is too expensive and was told by her insurance that gabapentin was an alternative. The other medication is the valacyclovir. Patient wants to know if there is an alternative to valacyclovir as she feels she cannot stop this medication.

## 2018-04-24 NOTE — Telephone Encounter (Signed)
ATC patient x's 3.  Received busy signal.  Will try again at a later time.

## 2018-04-24 NOTE — Telephone Encounter (Signed)
LVM informing patient that I had re sent medications that we got a fax from [employer] and that she will need to call the pharmacy to see if they are getting them.

## 2018-04-27 MED ORDER — GABAPENTIN 300 MG PO CAPS: 300.0000 mg | ORAL_CAPSULE | Freq: Two times a day (BID) | ORAL | 0 refills | Status: DC

## 2018-04-27 NOTE — Telephone Encounter (Signed)
LVM for patient to call back for MD response  

## 2018-04-27 NOTE — Telephone Encounter (Signed)
ATC Patient.  Received busy signal. Will try again at a later time.

## 2018-04-27 NOTE — Telephone Encounter (Signed)
Sent in gabapentin. Do not take on same day as taking lyrica. She should start gabapentin 1 pill daily for 3 days. Then can increase to 1 pill twice a day and stay there. Can follow up if needed if not working well. Valtrex is the best option and there is not another good option for that.

## 2018-04-28 NOTE — Telephone Encounter (Signed)
ATC Patient.  Received busy signal. Will try again at a later time.

## 2018-04-29 NOTE — Telephone Encounter (Signed)
Closing per protocol 

## 2018-05-05 ENCOUNTER — Encounter: Payer: Self-pay | Admitting: Internal Medicine

## 2018-05-07 ENCOUNTER — Ambulatory Visit: Payer: Medicare Other

## 2018-05-09 DIAGNOSIS — J441 Chronic obstructive pulmonary disease with (acute) exacerbation: Secondary | ICD-10-CM | POA: Diagnosis not present

## 2018-05-09 DIAGNOSIS — J449 Chronic obstructive pulmonary disease, unspecified: Secondary | ICD-10-CM | POA: Diagnosis not present

## 2018-05-09 DIAGNOSIS — J9611 Chronic respiratory failure with hypoxia: Secondary | ICD-10-CM | POA: Diagnosis not present

## 2018-05-20 ENCOUNTER — Emergency Department (HOSPITAL_COMMUNITY): Payer: Medicare Other

## 2018-05-20 ENCOUNTER — Ambulatory Visit: Payer: Self-pay | Admitting: *Deleted

## 2018-05-20 ENCOUNTER — Inpatient Hospital Stay (HOSPITAL_COMMUNITY)
Admission: EM | Admit: 2018-05-20 | Discharge: 2018-05-24 | DRG: 190 | Disposition: A | Discharge status: Home / Self Care | Payer: Medicare Other | Attending: Internal Medicine | Admitting: Internal Medicine

## 2018-05-20 ENCOUNTER — Other Ambulatory Visit: Payer: Self-pay

## 2018-05-20 DIAGNOSIS — Z888 Allergy status to other drugs, medicaments and biological substances status: Secondary | ICD-10-CM | POA: Diagnosis not present

## 2018-05-20 DIAGNOSIS — Z87891 Personal history of nicotine dependence: Secondary | ICD-10-CM | POA: Diagnosis not present

## 2018-05-20 DIAGNOSIS — Z833 Family history of diabetes mellitus: Secondary | ICD-10-CM

## 2018-05-20 DIAGNOSIS — J9621 Acute and chronic respiratory failure with hypoxia: Secondary | ICD-10-CM | POA: Diagnosis present

## 2018-05-20 DIAGNOSIS — Z7951 Long term (current) use of inhaled steroids: Secondary | ICD-10-CM | POA: Diagnosis not present

## 2018-05-20 DIAGNOSIS — R062 Wheezing: Secondary | ICD-10-CM | POA: Diagnosis not present

## 2018-05-20 DIAGNOSIS — R0902 Hypoxemia: Secondary | ICD-10-CM

## 2018-05-20 DIAGNOSIS — Z96643 Presence of artificial hip joint, bilateral: Secondary | ICD-10-CM | POA: Diagnosis not present

## 2018-05-20 DIAGNOSIS — R0602 Shortness of breath: Secondary | ICD-10-CM | POA: Diagnosis not present

## 2018-05-20 DIAGNOSIS — Z79890 Hormone replacement therapy (postmenopausal): Secondary | ICD-10-CM

## 2018-05-20 DIAGNOSIS — E1122 Type 2 diabetes mellitus with diabetic chronic kidney disease: Secondary | ICD-10-CM | POA: Diagnosis not present

## 2018-05-20 DIAGNOSIS — T380X5A Adverse effect of glucocorticoids and synthetic analogues, initial encounter: Secondary | ICD-10-CM | POA: Diagnosis present

## 2018-05-20 DIAGNOSIS — I129 Hypertensive chronic kidney disease with stage 1 through stage 4 chronic kidney disease, or unspecified chronic kidney disease: Secondary | ICD-10-CM | POA: Diagnosis present

## 2018-05-20 DIAGNOSIS — K219 Gastro-esophageal reflux disease without esophagitis: Secondary | ICD-10-CM | POA: Diagnosis not present

## 2018-05-20 DIAGNOSIS — N182 Chronic kidney disease, stage 2 (mild): Secondary | ICD-10-CM | POA: Diagnosis not present

## 2018-05-20 DIAGNOSIS — E785 Hyperlipidemia, unspecified: Secondary | ICD-10-CM | POA: Diagnosis not present

## 2018-05-20 DIAGNOSIS — E669 Obesity, unspecified: Secondary | ICD-10-CM | POA: Diagnosis present

## 2018-05-20 DIAGNOSIS — I10 Essential (primary) hypertension: Secondary | ICD-10-CM | POA: Diagnosis not present

## 2018-05-20 DIAGNOSIS — Z9989 Dependence on other enabling machines and devices: Secondary | ICD-10-CM | POA: Diagnosis not present

## 2018-05-20 DIAGNOSIS — Z720 Tobacco use: Secondary | ICD-10-CM | POA: Diagnosis present

## 2018-05-20 DIAGNOSIS — F17210 Nicotine dependence, cigarettes, uncomplicated: Secondary | ICD-10-CM | POA: Diagnosis present

## 2018-05-20 DIAGNOSIS — G4733 Obstructive sleep apnea (adult) (pediatric): Secondary | ICD-10-CM

## 2018-05-20 DIAGNOSIS — J441 Chronic obstructive pulmonary disease with (acute) exacerbation: Principal | ICD-10-CM | POA: Diagnosis present

## 2018-05-20 DIAGNOSIS — E034 Atrophy of thyroid (acquired): Secondary | ICD-10-CM | POA: Diagnosis present

## 2018-05-20 DIAGNOSIS — D72828 Other elevated white blood cell count: Secondary | ICD-10-CM | POA: Diagnosis present

## 2018-05-20 DIAGNOSIS — E876 Hypokalemia: Secondary | ICD-10-CM | POA: Diagnosis not present

## 2018-05-20 DIAGNOSIS — T502X5A Adverse effect of carbonic-anhydrase inhibitors, benzothiadiazides and other diuretics, initial encounter: Secondary | ICD-10-CM | POA: Diagnosis present

## 2018-05-20 DIAGNOSIS — Z91013 Allergy to seafood: Secondary | ICD-10-CM | POA: Diagnosis not present

## 2018-05-20 DIAGNOSIS — Z79899 Other long term (current) drug therapy: Secondary | ICD-10-CM

## 2018-05-20 DIAGNOSIS — Z6833 Body mass index (BMI) 33.0-33.9, adult: Secondary | ICD-10-CM

## 2018-05-20 DIAGNOSIS — Z6835 Body mass index (BMI) 35.0-35.9, adult: Secondary | ICD-10-CM | POA: Diagnosis not present

## 2018-05-20 DIAGNOSIS — E1142 Type 2 diabetes mellitus with diabetic polyneuropathy: Secondary | ICD-10-CM | POA: Diagnosis not present

## 2018-05-20 DIAGNOSIS — Z9981 Dependence on supplemental oxygen: Secondary | ICD-10-CM | POA: Diagnosis not present

## 2018-05-20 DIAGNOSIS — E7800 Pure hypercholesterolemia, unspecified: Secondary | ICD-10-CM | POA: Diagnosis present

## 2018-05-20 DIAGNOSIS — J410 Simple chronic bronchitis: Secondary | ICD-10-CM | POA: Diagnosis present

## 2018-05-20 DIAGNOSIS — F319 Bipolar disorder, unspecified: Secondary | ICD-10-CM | POA: Diagnosis present

## 2018-05-20 DIAGNOSIS — E1165 Type 2 diabetes mellitus with hyperglycemia: Secondary | ICD-10-CM | POA: Diagnosis present

## 2018-05-20 LAB — COMPREHENSIVE METABOLIC PANEL
ALT: 14 U/L (ref 0–44)
AST: 16 U/L (ref 15–41)
Albumin: 3.9 g/dL (ref 3.5–5.0)
Alkaline Phosphatase: 127 U/L — ABNORMAL HIGH (ref 38–126)
Anion gap: 11 (ref 5–15)
BUN: 11 mg/dL (ref 6–20)
CO2: 27 mmol/L (ref 22–32)
Calcium: 9 mg/dL (ref 8.9–10.3)
Chloride: 95 mmol/L — ABNORMAL LOW (ref 98–111)
Creatinine, Ser: 1.17 mg/dL — ABNORMAL HIGH (ref 0.44–1.00)
GFR calc Af Amer: >60 mL/min (ref >60)
GFR calc non Af Amer: 52 mL/min — ABNORMAL LOW (ref >60)
Glucose, Bld: 140 mg/dL — ABNORMAL HIGH (ref 70–99)
Potassium: 2.7 mmol/L — CL (ref 3.5–5.1)
Sodium: 133 mmol/L — ABNORMAL LOW (ref 135–145)
Total Bilirubin: 0.4 mg/dL (ref 0.3–1.2)
Total Protein: 7 g/dL (ref 6.5–8.1)

## 2018-05-20 LAB — CBC WITH DIFFERENTIAL/PLATELET
Abs Immature Granulocytes: 0.05 10*3/uL (ref 0.00–0.07)
Basophils Absolute: 0.1 10*3/uL (ref 0.0–0.1)
Basophils Relative: 1 %
Eosinophils Absolute: 0.3 10*3/uL (ref 0.0–0.5)
Eosinophils Relative: 2 %
HCT: 40 % (ref 36.0–46.0)
Hemoglobin: 12.8 g/dL (ref 12.0–15.0)
Immature Granulocytes: 0 %
Lymphocytes Relative: 20 %
Lymphs Abs: 2.7 10*3/uL (ref 0.7–4.0)
MCH: 29 pg (ref 26.0–34.0)
MCHC: 32 g/dL (ref 30.0–36.0)
MCV: 90.7 fL (ref 80.0–100.0)
Monocytes Absolute: 0.8 10*3/uL (ref 0.1–1.0)
Monocytes Relative: 6 %
Neutro Abs: 9.6 10*3/uL — ABNORMAL HIGH (ref 1.7–7.7)
Neutrophils Relative %: 71 %
Platelets: 260 10*3/uL (ref 150–400)
RBC: 4.41 MIL/uL (ref 3.87–5.11)
RDW: 14.7 % (ref 11.5–15.5)
WBC: 13.5 10*3/uL — ABNORMAL HIGH (ref 4.0–10.5)
nRBC: 0 % (ref 0.0–0.2)

## 2018-05-20 LAB — GLUCOSE, CAPILLARY: Glucose-Capillary: 265 mg/dL — ABNORMAL HIGH (ref 70–99)

## 2018-05-20 LAB — MAGNESIUM: Magnesium: 1.9 mg/dL (ref 1.7–2.4)

## 2018-05-20 MED ORDER — POTASSIUM CHLORIDE 10 MEQ/100ML IV SOLN
10.0000 meq | Freq: Once | INTRAVENOUS | Status: AC
  Administered 2018-05-20: 10 meq via INTRAVENOUS
  Filled 2018-05-20: qty 100

## 2018-05-20 MED ORDER — LEVOTHYROXINE SODIUM 25 MCG PO TABS
25.0000 ug | ORAL_TABLET | Freq: Every day | ORAL | Status: DC
  Administered 2018-05-21 – 2018-05-24 (×4): 25 ug via ORAL
  Filled 2018-05-20 (×4): qty 1

## 2018-05-20 MED ORDER — ROFLUMILAST 500 MCG PO TABS
500.0000 ug | ORAL_TABLET | Freq: Every day | ORAL | Status: DC
  Administered 2018-05-21 – 2018-05-23 (×3): 500 ug via ORAL
  Filled 2018-05-20 (×4): qty 1

## 2018-05-20 MED ORDER — HYDRALAZINE HCL 20 MG/ML IJ SOLN: 5.0000 mg | Freq: Four times a day (QID) | INTRAMUSCULAR | Status: DC | PRN

## 2018-05-20 MED ORDER — IPRATROPIUM BROMIDE 0.02 % IN SOLN
1.0000 mg | Freq: Once | RESPIRATORY_TRACT | Status: AC
  Administered 2018-05-20: 1 mg via RESPIRATORY_TRACT
  Filled 2018-05-20: qty 5

## 2018-05-20 MED ORDER — BENZONATATE 100 MG PO CAPS
100.0000 mg | ORAL_CAPSULE | Freq: Once | ORAL | Status: AC
  Administered 2018-05-20: 100 mg via ORAL
  Filled 2018-05-20: qty 1

## 2018-05-20 MED ORDER — ONDANSETRON HCL 4 MG PO TABS: 4.0000 mg | ORAL_TABLET | Freq: Four times a day (QID) | ORAL | Status: DC | PRN

## 2018-05-20 MED ORDER — BUPROPION HCL ER (XL) 150 MG PO TB24
150.0000 mg | ORAL_TABLET | Freq: Every day | ORAL | Status: DC
  Administered 2018-05-21 – 2018-05-24 (×4): 150 mg via ORAL
  Filled 2018-05-20 (×4): qty 1

## 2018-05-20 MED ORDER — MAGNESIUM SULFATE 2 GM/50ML IV SOLN
2.0000 g | Freq: Once | INTRAVENOUS | Status: AC
  Administered 2018-05-20: 2 g via INTRAVENOUS
  Filled 2018-05-20: qty 50

## 2018-05-20 MED ORDER — GABAPENTIN 300 MG PO CAPS
300.0000 mg | ORAL_CAPSULE | Freq: Two times a day (BID) | ORAL | Status: DC
  Administered 2018-05-21 – 2018-05-24 (×7): 300 mg via ORAL
  Filled 2018-05-20 (×7): qty 1

## 2018-05-20 MED ORDER — TRAZODONE HCL 100 MG PO TABS
100.0000 mg | ORAL_TABLET | Freq: Every day | ORAL | Status: DC
  Administered 2018-05-20 – 2018-05-23 (×4): 100 mg via ORAL
  Filled 2018-05-20 (×4): qty 1

## 2018-05-20 MED ORDER — METHYLPREDNISOLONE SODIUM SUCC 125 MG IJ SOLR
60.0000 mg | Freq: Two times a day (BID) | INTRAMUSCULAR | Status: DC
  Administered 2018-05-21: 60 mg via INTRAVENOUS
  Filled 2018-05-20 (×2): qty 2

## 2018-05-20 MED ORDER — IPRATROPIUM-ALBUTEROL 0.5-2.5 (3) MG/3ML IN SOLN
3.0000 mL | Freq: Four times a day (QID) | RESPIRATORY_TRACT | Status: DC
  Administered 2018-05-21 – 2018-05-22 (×5): 3 mL via RESPIRATORY_TRACT
  Filled 2018-05-20 (×5): qty 3

## 2018-05-20 MED ORDER — POTASSIUM CHLORIDE CRYS ER 20 MEQ PO TBCR
40.0000 meq | EXTENDED_RELEASE_TABLET | Freq: Once | ORAL | Status: AC
  Administered 2018-05-20: 40 meq via ORAL
  Filled 2018-05-20: qty 2

## 2018-05-20 MED ORDER — CYCLOBENZAPRINE HCL 10 MG PO TABS
10.0000 mg | ORAL_TABLET | Freq: Every day | ORAL | Status: DC
  Administered 2018-05-20 – 2018-05-23 (×4): 10 mg via ORAL
  Filled 2018-05-20 (×4): qty 1

## 2018-05-20 MED ORDER — PANTOPRAZOLE SODIUM 40 MG PO TBEC
40.0000 mg | DELAYED_RELEASE_TABLET | Freq: Every day | ORAL | Status: DC
  Administered 2018-05-21 – 2018-05-24 (×4): 40 mg via ORAL
  Filled 2018-05-20 (×4): qty 1

## 2018-05-20 MED ORDER — ATENOLOL 50 MG PO TABS
25.0000 mg | ORAL_TABLET | Freq: Every day | ORAL | Status: DC
  Administered 2018-05-21 – 2018-05-24 (×4): 25 mg via ORAL
  Filled 2018-05-20 (×4): qty 1

## 2018-05-20 MED ORDER — ALBUTEROL (5 MG/ML) CONTINUOUS INHALATION SOLN
10.0000 mg/h | INHALATION_SOLUTION | RESPIRATORY_TRACT | Status: DC
  Administered 2018-05-20: 10 mg/h via RESPIRATORY_TRACT
  Filled 2018-05-20: qty 20

## 2018-05-20 MED ORDER — FLUTICASONE FUROATE-VILANTEROL 200-25 MCG/INH IN AEPB
1.0000 | INHALATION_SPRAY | Freq: Every day | RESPIRATORY_TRACT | Status: DC
  Administered 2018-05-21 – 2018-05-24 (×4): 1 via RESPIRATORY_TRACT
  Filled 2018-05-20: qty 28

## 2018-05-20 MED ORDER — VENLAFAXINE HCL ER 150 MG PO CP24
150.0000 mg | ORAL_CAPSULE | Freq: Every day | ORAL | Status: DC
  Administered 2018-05-21 – 2018-05-24 (×4): 150 mg via ORAL
  Filled 2018-05-20 (×4): qty 1

## 2018-05-20 MED ORDER — INSULIN ASPART 100 UNIT/ML ~~LOC~~ SOLN
0.0000 [IU] | Freq: Three times a day (TID) | SUBCUTANEOUS | Status: DC
  Administered 2018-05-21: 0 [IU] via SUBCUTANEOUS

## 2018-05-20 MED ORDER — ACETAMINOPHEN 650 MG RE SUPP: 650.0000 mg | Freq: Four times a day (QID) | RECTAL | Status: DC | PRN

## 2018-05-20 MED ORDER — UMECLIDINIUM BROMIDE 62.5 MCG/INH IN AEPB
1.0000 | INHALATION_SPRAY | Freq: Every day | RESPIRATORY_TRACT | Status: DC
  Filled 2018-05-20: qty 7

## 2018-05-20 MED ORDER — ONDANSETRON HCL 4 MG/2ML IJ SOLN: 4.0000 mg | Freq: Four times a day (QID) | INTRAMUSCULAR | Status: DC | PRN

## 2018-05-20 MED ORDER — MONTELUKAST SODIUM 10 MG PO TABS
10.0000 mg | ORAL_TABLET | Freq: Every day | ORAL | Status: DC
  Administered 2018-05-20 – 2018-05-23 (×4): 10 mg via ORAL
  Filled 2018-05-20 (×4): qty 1

## 2018-05-20 MED ORDER — VALACYCLOVIR HCL 500 MG PO TABS
500.0000 mg | ORAL_TABLET | Freq: Every day | ORAL | Status: DC
  Administered 2018-05-21 – 2018-05-24 (×4): 500 mg via ORAL
  Filled 2018-05-20 (×4): qty 1

## 2018-05-20 MED ORDER — ENOXAPARIN SODIUM 40 MG/0.4ML ~~LOC~~ SOLN
40.0000 mg | Freq: Every day | SUBCUTANEOUS | Status: DC
  Administered 2018-05-21 – 2018-05-24 (×4): 40 mg via SUBCUTANEOUS
  Filled 2018-05-20 (×4): qty 0.4

## 2018-05-20 MED ORDER — ACETAMINOPHEN 325 MG PO TABS
650.0000 mg | ORAL_TABLET | Freq: Four times a day (QID) | ORAL | Status: DC | PRN
  Administered 2018-05-21 – 2018-05-22 (×2): 650 mg via ORAL
  Filled 2018-05-20 (×2): qty 2

## 2018-05-20 MED ORDER — ATORVASTATIN CALCIUM 10 MG PO TABS
20.0000 mg | ORAL_TABLET | Freq: Every day | ORAL | Status: DC
  Administered 2018-05-21 – 2018-05-24 (×4): 20 mg via ORAL
  Filled 2018-05-20 (×4): qty 2

## 2018-05-20 NOTE — ED Notes (Addendum)
ED TO INPATIENT HANDOFF REPORT  ED Nurse Name and Phone #:  Clydene Laming [phone]  V Name/Age/Gender Joan [name] 58 y.o. female Room/Bed: 042C/042C  Code Status :  Full Code   Home/SNF/Other : Home  {Patient oriented to : Person/Time/Place/Situation  Is this baseline? Yes  Triage Complete: Triage complete  Chief Complaint COPD  Triage Note Pt here for sob x 2 days, worse with exertion. Productive cough x4 days. Denies fever, chills, weakness, or recent travel. Hx COPD. Wears 2 L O2 at home and CPAP at night. 10 mg albuterol, 0.5 mg atrovent, and 125 mg solumedrol given PTA.   Allergies Allergies  Allergen Reactions  . Keflex [Cephalexin] Other (See Comments)    unspecified  . Prednisone Other (See Comments)    "counter reacts"  . Shellfish Allergy Other (See Comments)    Stomach cramps  . Tetracyclines & Related Other (See Comments)    unspecified  . Dilaudid [Hydromorphone Hcl] Other (See Comments)    "Lethargy"  . Incruse Ellipta [Umeclidinium Bromide] Nausea Only  . Tuberculin Tests Other (See Comments)    Level of Care/Admitting Diagnosis ED Disposition    ED Disposition Condition Comment   Admit  Hospital Area: [facility] [[id number]]  Level of Care: Med-Surg [16]  I expect the patient will be discharged within 24 hours: Yes  LOW acuity---Tx typically complete <24 hrs---ACUTE conditions typically can be evaluated <24 hours---LABS likely to return to acceptable levels <24 hours---IS near functional baseline---EXPECTED to return to current living arrangement---NOT newly hypoxic: Meets criteria for 5C-Observation unit  Diagnosis: COPD exacerbation [facility]) [[id number]]  Admitting Physician: Mariel Aloe [phone]  Attending Physician: Mariel Aloe [phone]  PT Class (Do Not Modify): Observation [104]  PT Acc Code (Do Not Modify): Observation [10022]       B Medical/Surgery History Past Medical History:  Diagnosis Date  . Acute respiratory  failure with hypoxia ([place])   . Alcoholism and drug addiction in family   . Anxiety   . Arthritis    "knees, hips, lower back" (09/25/2016)  . Asthma   . Bipolar disorder ([place])   . Chronic bronchitis ([place])    "all the time" (09/25/2016)  . Chronic leg pain    RLE  . Chronic lower back pain   . COPD (chronic obstructive pulmonary disease) ([place])   . Depression   . Emphysema of lung ([place])   . GERD (gastroesophageal reflux disease)   . Headache   . Hepatitis C    "treated back in 2001-2002"  . High cholesterol   . Hypertension   . Hypothyroidism   . Incarcerated ventral hernia 04/04/2017  . On home oxygen therapy    "2L; at nighttime" (04/04/2017)  . OSA on CPAP   . Pneumonia    "all the time" (09/25/2016)  . Positive PPD    with negative chest x ray  . Tachycardia    patient reports with exertion ; denies any other conditions   . Thyroid disease    Past Surgical History:  Procedure Laterality Date  . BACK SURGERY    . CARDIAC CATHETERIZATION    . COLONOSCOPY    . CYST EXCISION     removal of cyst in sinuses  . DILATION AND CURETTAGE OF UTERUS    . FOOT SURGERY Bilateral    bone removed from 4th and 5 th toes and put in pin then took pin out  . HERNIA REPAIR    . INCISION AND  DRAINAGE  ~ 2014/2015   "removed mesh & infection"  . INCISIONAL HERNIA REPAIR N/A 04/04/2017   Procedure: LAPAROSCOPIC INCISIONAL HERNIA REPAIR WITH MESH;  Surgeon: Fanny Skates, MD;  Location: [facility];  Service: General;  Laterality: N/A;  . LACERATION REPAIR Right    repair wrist artery from laceration from ice skate  . LAPAROSCOPIC INCISIONAL / UMBILICAL / VENTRAL HERNIA REPAIR  04/04/2017   LAPAROSCOPIC INCISIONAL HERNIA REPAIR WITH MESH  . LIPOMA EXCISION Right    fattty lipoma in neck  . NASAL SEPTUM SURGERY    . POSTERIOR LUMBAR FUSION  2015/2016 X 2   "added rods and screws"  . TOTAL HIP ARTHROPLASTY Right 02/05/2017   Procedure: RIGHT TOTAL HIP ARTHROPLASTY;  Surgeon: [name], MD;   [facility];  Service: Orthopedics;  Laterality: Right;  . TOTAL HIP ARTHROPLASTY Left 06/18/2017   Procedure: LEFT TOTAL HIP ARTHROPLASTY;  Surgeon: [name], MD;  [facility];  Service: Orthopedics;  Laterality: Left;  . TUBAL LIGATION    . UMBILICAL HERNIA REPAIR  ~ 2013   w/mesh  . UPPER GI ENDOSCOPY       A IV Location/Drains/Wounds Patient Lines/Drains/Airways Status   Active Line/Drains/Airways    Name:   Placement date:   Placement time:   Site:   Days:   Peripheral IV 12/29/17 Anterior;Right;Lateral Forearm   12/29/17    -    Forearm   142   Peripheral IV 05/20/18 Left Hand   05/20/18    1803    Hand   less than 1   Incision (Closed) 04/04/17 Abdomen Other (Comment)   04/04/17    0811     411   Incision (Closed) 06/18/17 Hip Left   06/18/17    0939     336   Incision - 4 Ports Abdomen 1: Umbilicus 2: Left;Lateral;Upper 3: Left;Lateral;Mid 4: Left;Lateral;Upper   04/04/17    0800     411   Incision - 1 Port Abdomen 1: Right;Mid;Lateral   04/04/17    0902     411          Intake/Output Last 24 hours No intake or output data in the 24 hours ending 05/20/18 2136  Labs/Imaging Results for orders placed or performed during the hospital encounter of 05/20/18 (from the past 48 hour(s))  CBC with Differential     Status: Abnormal   Collection Time: 05/20/18  6:44 PM  Result Value Ref Range   WBC 13.5 (H) 4.0 - 10.5 K/uL   RBC 4.41 3.87 - 5.11 MIL/uL   Hemoglobin 12.8 12.0 - 15.0 g/dL   HCT 40.0 36.0 - 46.0 %   MCV 90.7 80.0 - 100.0 fL   MCH 29.0 26.0 - 34.0 pg   MCHC 32.0 30.0 - 36.0 g/dL   RDW 14.7 11.5 - 15.5 %   Platelets 260 150 - 400 K/uL   nRBC 0.0 0.0 - 0.2 %   Neutrophils Relative % 71 %   Neutro Abs 9.6 (H) 1.7 - 7.7 K/uL   Lymphocytes Relative 20 %   Lymphs Abs 2.7 0.7 - 4.0 K/uL   Monocytes Relative 6 %   Monocytes Absolute 0.8 0.1 - 1.0 K/uL   Eosinophils Relative 2 %   Eosinophils Absolute 0.3 0.0 - 0.5 K/uL   Basophils Relative 1 %    Basophils Absolute 0.1 0.0 - 0.1 K/uL   Immature Granulocytes 0 %   Abs Immature Granulocytes 0.05 0.00 - 0.07 K/uL  Comment: Performed at [facility], [place] [address], [place], [place] [address]  Comprehensive metabolic panel     Status: Abnormal   Collection Time: 05/20/18  6:44 PM  Result Value Ref Range   Sodium 133 (L) 135 - 145 mmol/L   Potassium 2.7 (LL) 3.5 - 5.1 mmol/L    Comment: CRITICAL RESULT CALLED TO, READ BACK BY AND VERIFIED WITHGareth Eagle RN AT 1749 ON [id number] BY K FORSYTH    Chloride 95 (L) 98 - 111 mmol/L   CO2 27 22 - 32 mmol/L   Glucose, Bld 140 (H) 70 - 99 mg/dL   BUN 11 6 - 20 mg/dL   Creatinine, Ser 1.17 (H) 0.44 - 1.00 mg/dL   Calcium 9.0 8.9 - 10.3 mg/dL   Total Protein 7.0 6.5 - 8.1 g/dL   Albumin 3.9 3.5 - 5.0 g/dL   AST 16 15 - 41 U/L   ALT 14 0 - 44 U/L   Alkaline Phosphatase 127 (H) 38 - 126 U/L   Total Bilirubin 0.4 0.3 - 1.2 mg/dL   GFR calc non Af Amer 52 (L) >60 mL/min   GFR calc Af Amer >60 >60 mL/min   Anion gap 11 5 - 15    Comment: Performed at [facility], [place] [address], [place], [place] [address]  Magnesium     Status: None   Collection Time: 05/20/18  7:59 PM  Result Value Ref Range   Magnesium 1.9 1.7 - 2.4 mg/dL    Comment: Performed at [facility], [place] [address], [place], [place] [address]   Dg Chest Portable 1 View  Result Date: 05/20/2018 CLINICAL DATA:  Shortness of breath. EXAM: PORTABLE CHEST 1 VIEW COMPARISON:  Chest x-ray dated December 28, 2017. FINDINGS: The heart size and mediastinal contours are within normal limits. Normal pulmonary vascularity. The lungs remain mildly hyperinflated with emphysematous changes and coarsened interstitial markings at the lung bases. No focal consolidation, pleural effusion, or pneumothorax. No acute osseous abnormality. IMPRESSION: 1. No active disease. 2. COPD. Electronically Signed   By: Titus Dubin M.D.   On: 05/20/2018 18:49    Pending Labs [employer]  (From admission, onward)    Start     Ordered   Signed and Held  Creatinine, serum  (enoxaparin (LOVENOX)    CrCl >/= 30 ml/min)  Weekly,   R    Comments:  while on enoxaparin therapy    Signed and Held   Signed and Held  Basic metabolic panel  Tomorrow morning,   R     Signed and Held   Signed and Held  CBC  Tomorrow morning,   R     Signed and Held          Vitals/Pain Today's Vitals   05/20/18 2000 05/20/18 2015 05/20/18 2111 05/20/18 2113  BP: 134/82 (!) 122/55 128/74   Pulse: 88 84 88   Resp: 18 (!) 22 16   Temp:      TempSrc:      SpO2: 93% 93% 95%   Weight:      Height:      PainSc:   0-No pain 0-No pain    Isolation Precautions No active isolations  Medications Medications  albuterol (PROVENTIL,VENTOLIN) solution continuous neb (0 mg/hr Nebulization Stopped 05/20/18 2113)  benzonatate (TESSALON) capsule 100 mg (100 mg Oral Given 05/20/18 1818)  ipratropium (ATROVENT) nebulizer solution 1 mg (1 mg Nebulization Given 05/20/18 1821)  potassium chloride 10  mEq in 100 mL IVPB (0 mEq Intravenous Stopped 05/20/18 2102)  potassium chloride SA (K-DUR,KLOR-CON) CR tablet 40 mEq (40 mEq Oral Given 05/20/18 2007)  magnesium sulfate IVPB 2 g 50 mL (0 g Intravenous Stopped 05/20/18 2102)    Mobility walks Low fall risk   Focused Assessments  BREATHING EASIER AFTER NEBULIZER TREATMENT / MAGNESIUM 2 GRAMS IV    R Recommendations: See Admitting Provider Note  Report given to:   Additional Notes:  No family member present . IV site intact . O2 sat= 97%  2lpm/[place]. Admitting diagnosis : COPD exacerbation .

## 2018-05-20 NOTE — ED Notes (Signed)
Blue and Istat vials pulled from bloodwork, in room.

## 2018-05-20 NOTE — ED Triage Notes (Addendum)
Pt here for sob x 2 days, worse with exertion. Productive cough x4 days. Denies fever, chills, weakness, or recent travel. Hx COPD. Wears 2 L O2 at home and CPAP at night. 10 mg albuterol, 0.5 mg atrovent, and 125 mg solumedrol given PTA.

## 2018-05-20 NOTE — H&P (Addendum)
History and Physical    Joan [name] BTD:[id number] DOB: 09-24-1960 DOA: 05/20/2018  PCP: [name], MD Patient coming from: Home  Chief Complaint: Shortness of breath  HPI: Joan [name] is a 58 y.o. female with medical history significant of chronic respiratory failure, COPD, bipolar disorder, hypertension, hypothyroidism. Patient presents secondary to 4 days of worsening shortness of breath. She has associated intermittent sputum production, dyspnea on exertion, chest pain with coughing. She has been using her home nebulizer/inhalers without much improvement in symptoms. She thinks this has been triggered by her allergies. No sick contacts. No recent travel. Symptoms have mildly improved with ED management.  ED Course: Vitals: Afebrile, normal pulse, pulse in 16-22 range, normotesnsive, on 2L via  Labs: Sodium of 133, potassium of 2.7, Creatinine of 1.17, alkaline phosphatase of 127, WBC of 13.5 Imaging: CXR significant for no acute disease process Medications/Course: Mag, albuterol continuous neb  Review of Systems: Review of Systems  Constitutional: Negative for chills, fever and malaise/fatigue.  Respiratory: Positive for cough, sputum production, shortness of breath and wheezing.   Cardiovascular: Positive for chest pain. Negative for palpitations.  Gastrointestinal: Positive for nausea. Negative for abdominal pain, constipation, diarrhea and vomiting.  All other systems reviewed and are negative.   Past Medical History:  Diagnosis Date  . Acute respiratory failure with hypoxia ([place])   . Alcoholism and drug addiction in family   . Anxiety   . Arthritis    "knees, hips, lower back" (09/25/2016)  . Asthma   . Bipolar disorder ([place])   . Chronic bronchitis ([place])    "all the time" (09/25/2016)  . Chronic leg pain    RLE  . Chronic lower back pain   . COPD (chronic obstructive pulmonary disease) ([place])   . Depression   . Emphysema of lung ([place])   . GERD  (gastroesophageal reflux disease)   . Headache   . Hepatitis C    "treated back in 2001-2002"  . High cholesterol   . Hypertension   . Hypothyroidism   . Incarcerated ventral hernia 04/04/2017  . On home oxygen therapy    "2L; at nighttime" (04/04/2017)  . OSA on CPAP   . Pneumonia    "all the time" (09/25/2016)  . Positive PPD    with negative chest x ray  . Tachycardia    patient reports with exertion ; denies any other conditions   . Thyroid disease     Past Surgical History:  Procedure Laterality Date  . BACK SURGERY    . CARDIAC CATHETERIZATION    . COLONOSCOPY    . CYST EXCISION     removal of cyst in sinuses  . DILATION AND CURETTAGE OF UTERUS    . FOOT SURGERY Bilateral    bone removed from 4th and 5 th toes and put in pin then took pin out  . HERNIA REPAIR    . INCISION AND DRAINAGE  ~ 2014/2015   "removed mesh & infection"  . INCISIONAL HERNIA REPAIR N/A 04/04/2017   Procedure: LAPAROSCOPIC INCISIONAL HERNIA REPAIR WITH MESH;  Surgeon: Fanny Skates, MD;  Location: [facility];  Service: General;  Laterality: N/A;  . LACERATION REPAIR Right    repair wrist artery from laceration from ice skate  . LAPAROSCOPIC INCISIONAL / UMBILICAL / VENTRAL HERNIA REPAIR  04/04/2017   LAPAROSCOPIC INCISIONAL HERNIA REPAIR WITH MESH  . LIPOMA EXCISION Right    fattty lipoma in neck  . NASAL SEPTUM SURGERY    . POSTERIOR  LUMBAR FUSION  2015/2016 X 2   "added rods and screws"  . TOTAL HIP ARTHROPLASTY Right 02/05/2017   Procedure: RIGHT TOTAL HIP ARTHROPLASTY;  Surgeon: [name], MD;  [facility];  Service: Orthopedics;  Laterality: Right;  . TOTAL HIP ARTHROPLASTY Left 06/18/2017   Procedure: LEFT TOTAL HIP ARTHROPLASTY;  Surgeon: [name], MD;  [facility];  Service: Orthopedics;  Laterality: Left;  . TUBAL LIGATION    . UMBILICAL HERNIA REPAIR  ~ 2013   w/mesh  . UPPER GI ENDOSCOPY       reports that she quit smoking about 2 months ago. Her smoking use  included cigarettes. She has a 44.00 pack-year smoking history. She has never used smokeless tobacco. She reports current alcohol use. She reports current drug use. Drug: "Crack" cocaine.  Allergies  Allergen Reactions  . Keflex [Cephalexin] Other (See Comments)    unspecified  . Prednisone Other (See Comments)    "counter reacts"  . Shellfish Allergy Other (See Comments)    Stomach cramps  . Tetracyclines & Related Other (See Comments)    unspecified  . Dilaudid [Hydromorphone Hcl] Other (See Comments)    "Lethargy"  . Incruse Ellipta [Umeclidinium Bromide] Nausea Only  . Tuberculin Tests Other (See Comments)    Family History  Problem Relation Age of Onset  . Diabetes Mother   . Hypertension Mother   . Hypertension Father   . Cancer Father        lung   Prior to Admission medications   Medication Sig Start Date End Date Taking? Authorizing Provider  albuterol (PROVENTIL) (2.5 MG/3ML) 0.083% nebulizer solution Take 3 mLs (2.5 mg total) by nebulization 2 (two) times daily. And every 6 hours as needed.  DX: J44.9 04/17/18   [name], MD  atenolol (TENORMIN) 25 MG tablet Take 1 tablet (25 mg total) by mouth daily. 04/22/18   [name], MD  atorvastatin (LIPITOR) 20 MG tablet Take 1 tablet (20 mg total) by mouth daily. 04/22/18   [name], MD  betamethasone valerate ointment (VALISONE) 0.1 % USE 1 APPLICATION TOPICALLYTWO TIMES A DAY 03/02/18   [name], MD  buPROPion (WELLBUTRIN XL) 150 MG 24 hr tablet Take 1 tablet (150 mg total) by mouth daily. 04/14/18   [name], MD  cyclobenzaprine (FLEXERIL) 10 MG tablet Take 1 tablet (10 mg total) by mouth at bedtime. 04/14/18   [name], MD  diphenhydrAMINE (BENADRYL) 25 MG tablet Take 50 mg by mouth daily.    [provider]  gabapentin (NEURONTIN) 300 MG capsule Take 1 capsule (300 mg total) by mouth 2 (two) times daily. 04/27/18   [name], MD  gentamicin  cream (GARAMYCIN) 0.1 % Apply 1 application topically 2 (two) times daily. 03/23/18   Edrick Kins, DPM  hydrochlorothiazide (HYDRODIURIL) 25 MG tablet Take 1 tablet (25 mg total) by mouth daily. 04/14/18   [name], MD  Ipratropium-Albuterol (COMBIVENT RESPIMAT) 20-100 MCG/ACT AERS respimat Inhale 1 puff into the lungs every 6 (six) hours as needed for wheezing. 04/17/18   [name], MD  levothyroxine (SYNTHROID, LEVOTHROID) 25 MCG tablet Take 1 tablet (25 mcg total) by mouth daily before breakfast. 04/14/18   [name], MD  montelukast (SINGULAIR) 10 MG tablet Take 1 tablet (10 mg total) by mouth at bedtime. 04/17/18   [name], MD  naproxen (NAPROSYN) 500 MG tablet Take 1 tablet (500 mg total) by mouth 2 (  two) times daily with a meal. 04/14/18   [name], MD  nystatin-triamcinolone ointment [facility]) Apply 1 application topically 2 (two) times daily as needed (applied to skin folds/groins/under breast skin irritation rash.). 09/08/17   [name], MD  OXYGEN Inhale 2 L into the lungs daily.    [provider]  pantoprazole (PROTONIX) 40 MG tablet Take 1 tablet (40 mg total) by mouth daily. 04/22/18   [name], MD  roflumilast (DALIRESP) 500 MCG TABS tablet Take 1 tablet (500 mcg total) by mouth daily. 04/17/18   [name], MD  SPIRIVA RESPIMAT 2.5 MCG/ACT AERS Inhale 2 puffs into the lungs every morning. 04/17/18   [name], MD  SYMBICORT 160-4.5 MCG/ACT inhaler Inhale 2 puffs into the lungs 2 (two) times daily. 04/17/18   [name], MD  traZODone (DESYREL) 100 MG tablet Take 1 tablet (100 mg total) by mouth at bedtime. 04/14/18   [name], MD  valACYclovir (VALTREX) 500 MG tablet Take 1 tablet (500 mg total) by mouth daily. 04/14/18   [name], MD  venlafaxine XR (EFFEXOR-XR) 150 MG 24 hr capsule TAKE 1 CAPSULE DAILY WITH  BREAKFAST 04/14/18   [name], MD    Physical  Exam:  Physical Exam Constitutional:      General: She is not in acute distress.    Appearance: She is well-developed. She is not diaphoretic.  Eyes:     Conjunctiva/sclera: Conjunctivae normal.     Pupils: Pupils are equal, round, and reactive to light.  Neck:     Musculoskeletal: Normal range of motion.  Cardiovascular:     Rate and Rhythm: Normal rate and regular rhythm.     Heart sounds: Normal heart sounds. No murmur.  Pulmonary:     Effort: Tachypnea and prolonged expiration present. No respiratory distress or retractions.     Breath sounds: Decreased air movement present. No stridor. Decreased breath sounds and wheezing present. No rhonchi or rales.  Abdominal:     General: Bowel sounds are normal. There is no distension.     Palpations: Abdomen is soft.     Tenderness: There is no abdominal tenderness. There is no guarding or rebound.  Musculoskeletal: Normal range of motion.        General: No tenderness.  Lymphadenopathy:     Cervical: No cervical adenopathy.  Skin:    General: Skin is warm and dry.  Neurological:     Mental Status: She is alert and oriented to person, place, and time.      Labs on Admission: I have personally reviewed following labs and imaging studies  CBC: Recent Labs  Lab 05/20/18 1844  WBC 13.5*  NEUTROABS 9.6*  HGB 12.8  HCT 40.0  MCV 90.7  PLT 767    Basic Metabolic Panel: Recent Labs  Lab 05/20/18 1844 05/20/18 1959  NA 133*  --   K 2.7*  --   CL 95*  --   CO2 27  --   GLUCOSE 140*  --   BUN 11  --   CREATININE 1.17*  --   CALCIUM 9.0  --   MG  --  1.9    GFR: Estimated Creatinine Clearance: 66.4 mL/min (A) (by C-G formula based on SCr of 1.17 mg/dL (H)).  Liver Function Tests: Recent Labs  Lab 05/20/18 1844  AST 16  ALT 14  ALKPHOS 127*  BILITOT 0.4  PROT 7.0  ALBUMIN 3.9    Urine  analysis:    Component Value Date/Time   COLORURINE YELLOW 08/05/2017 1652   APPEARANCEUR CLEAR 08/05/2017 1652    LABSPEC 1.025 08/05/2017 1652   PHURINE 5.0 08/05/2017 1652   GLUCOSEU NEGATIVE 08/05/2017 1652   HGBUR TRACE-INTACT (A) 08/05/2017 1652   BILIRUBINUR NEGATIVE 08/05/2017 1652   KETONESUR NEGATIVE 08/05/2017 1652   PROTEINUR NEGATIVE 06/02/2017 2258   UROBILINOGEN 0.2 08/05/2017 1652   NITRITE NEGATIVE 08/05/2017 1652   LEUKOCYTESUR NEGATIVE 08/05/2017 1652     Radiological Exams on Admission: Dg Chest Portable 1 View  Result Date: 05/20/2018 CLINICAL DATA:  Shortness of breath. EXAM: PORTABLE CHEST 1 VIEW COMPARISON:  Chest x-ray dated December 28, 2017. FINDINGS: The heart size and mediastinal contours are within normal limits. Normal pulmonary vascularity. The lungs remain mildly hyperinflated with emphysematous changes and coarsened interstitial markings at the lung bases. No focal consolidation, pleural effusion, or pneumothorax. No acute osseous abnormality. IMPRESSION: 1. No active disease. 2. COPD. Electronically Signed   By: Titus Dubin M.D.   On: 05/20/2018 18:49    EKG: Independently reviewed. Normal sinus rhythm with some anterior lead t-wave flattening  Assessment/Plan Active Problems:   Essential hypertension   Hypothyroidism due to acquired atrophy of thyroid   GERD without esophagitis   OSA on CPAP   COPD exacerbation (HCC)   Tobacco abuse   COPD exacerbation Acute on chronic respiratory failure with hypoxia Likely triggered by allergies -Combivent -Solu-medrol 40 mg IV BID -PT eval, ambulate with pulse oximetry prior to discharge -Oxygen supplementation with goal SpO2 of >88%  OSA -Will use oxygen with continuous pulse oximetry while inpatient  Hypokalemia Potassium supplementation given in ED -Repeat BMP in AM  Hyperlipidemia -Continue Lipitor  Hypertension -Continue atenolol and hydrochlorothiazide  Hypothyroidism -Continue Synthroid  Other medications -Continue Effexor; patient does not remember why she is on this  Peripheral neuropathy  -Continue gabapentin  Steroid induced diabetes Not on medication as an outpatient. -SSI while inpatient -Carb modified diet since starting steroids  Allergic rhinitis -Continue Singulair  Tobacco abuse Currently on Wellbutrin for cessation -Continue Wellbutrin  DVT prophylaxis: Lovenox Code Status: Full code Family Communication: None at bedside Disposition Plan: Medical floor Consults called: None Admission status: Observation   Cordelia Poche, MD [facility] 05/20/2018, 8:39 PM  If 7PM-7AM, please contact night-coverage [email or web address] Password [email or web address]

## 2018-05-20 NOTE — ED Notes (Signed)
Attempted to call report

## 2018-05-20 NOTE — Telephone Encounter (Signed)
Patient has been cough up green,yellow thick sputum- 3-4 days. Patient is afraid she has pneumonia. Chest discomfort and back pain. Patient is very SOB and feels she is going to need transportation to hospital- she is going to call EMS for help there- did tell her about visitor restrictions.  Reason for Disposition . [1] MODERATE difficulty breathing (e.g., speaks in phrases, SOB even at rest, pulse 100-120) AND [2] NEW-onset or WORSE than normal  Answer Assessment - Initial Assessment Questions 1. RESPIRATORY STATUS: "Describe your breathing?" (e.g., wheezing, shortness of breath, unable to speak, severe coughing)      Coughing, sneezing, wheezing, SOB- gasping for air- 3-4 days 2. ONSET: "When did this breathing problem begin?"      3-4 days 3. PATTERN "Does the difficult breathing come and go, or has it been constant since it started?"      Mainly with movement- this morning had trouble breathing after removing the CPAP machine 4. SEVERITY: "How bad is your breathing?" (e.g., mild, moderate, severe)    - MILD: No SOB at rest, mild SOB with walking, speaks normally in sentences, can lay down, no retractions, pulse < 100.    - MODERATE: SOB at rest, SOB with minimal exertion and prefers to sit, cannot lie down flat, speaks in phrases, mild retractions, audible wheezing, pulse 100-120.    - SEVERE: Very SOB at rest, speaks in single words, struggling to breathe, sitting hunched forward, retractions, pulse > 120      moderate 5. RECURRENT SYMPTOM: "Have you had difficulty breathing before?" If so, ask: "When was the last time?" and "What happened that time?"      Yes- COPD, hx pneumonia - couple years ago- admitted to hospital 6. CARDIAC HISTORY: "Do you have any history of heart disease?" (e.g., heart attack, angina, bypass surgery, angioplasty)      no 7. LUNG HISTORY: "Do you have any history of lung disease?"  (e.g., pulmonary embolus, asthma, emphysema)     COPD, emphysema 8. CAUSE: "What  do you think is causing the breathing problem?"      Patient is not sure- patient thinks pneumonia  9. OTHER SYMPTOMS: "Do you have any other symptoms? (e.g., dizziness, runny nose, cough, chest pain, fever)     Cough,feverish, chest pain 10. PREGNANCY: "Is there any chance you are pregnant?" "When was your last menstrual period?"       n/a 11. TRAVEL: "Have you traveled out of the country in the last month?" (e.g., travel history, exposures)       No travel- no exposure none  Protocols used: BREATHING DIFFICULTY-A-AH

## 2018-05-20 NOTE — Telephone Encounter (Signed)
Patient calling to check the status of this form. States that it has to be filled out by her PCP, not pulmonary. Please advise.

## 2018-05-20 NOTE — ED Provider Notes (Signed)
Emergency Department Provider Note   I have reviewed the triage vital signs and the nursing notes.   HISTORY  Chief Complaint Shortness of Breath   HPI Joan [name] is a 58 y.o. female with history of COPD presents via EMS secondary to worsening cough and shortness of breath.  Patient states that she has been having this for about 4 days a similar to previous episodes of COPD exacerbation.  She does have a productive cough and has felt hot but no fever that she knows of.  Worse today and significant dyspnea with ambulating so EMS was called and patient was significantly diminished on their arrival.  She had oxygen saturation 95% on room air with there evaluation so they were given 10 mg of albuterol in route along with 125 of Solu-Medrol 1.5 of Atrovent.  Patient states that she wears oxygen and CPAP at night but during the day does not normally wear oxygen.  No lower extremity swelling.  No sick contacts or recent travels.  No chest pain aside from all over upper pain secondary to coughing and breathing hard. No other associated or modifying symptoms.    Past Medical History:  Diagnosis Date  . Acute respiratory failure with hypoxia ([place])   . Alcoholism and drug addiction in family   . Anxiety   . Arthritis    "knees, hips, lower back" (09/25/2016)  . Asthma   . Bipolar disorder ([place])   . Chronic bronchitis ([place])    "all the time" (09/25/2016)  . Chronic leg pain    RLE  . Chronic lower back pain   . COPD (chronic obstructive pulmonary disease) ([place])   . Depression   . Emphysema of lung ([place])   . GERD (gastroesophageal reflux disease)   . Headache   . Hepatitis C    "treated back in 2001-2002"  . High cholesterol   . Hypertension   . Hypothyroidism   . Incarcerated ventral hernia 04/04/2017  . On home oxygen therapy    "2L; at nighttime" (04/04/2017)  . OSA on CPAP   . Pneumonia    "all the time" (09/25/2016)  . Positive PPD    with negative chest x ray  . Tachycardia    patient reports with exertion ; denies any other conditions   . Thyroid disease     Patient Active Problem List   Diagnosis Date Noted  . Cough 03/25/2018  . Tobacco abuse 01/15/2018  . Hypomagnesemia 12/29/2017  . COPD exacerbation ([place]) 12/28/2017  . Insomnia 12/11/2017  . Pulmonary nodule 11/11/2017  . Carotid artery disease ([place]) 11/05/2017  . Hoarseness 09/09/2017  . OSA on CPAP 08/07/2017  . Family history of heart disease 07/18/2017  . GERD without esophagitis 06/23/2017  . H/O total hip arthroplasty, left 06/18/2017  . Hypothyroidism due to acquired atrophy of thyroid 04/05/2017  . Primary osteoarthritis involving multiple joints 04/05/2017  . Incarcerated ventral hernia 04/04/2017  . Chronic constipation 02/10/2017  . Iron deficiency anemia due to dietary causes 02/10/2017  . Osteoarthritis 02/10/2017  . Dyslipidemia 02/10/2017  . H/O total hip arthroplasty, right 02/05/2017  . Rash 01/08/2017  . Pre-operative clearance 12/16/2016  . Chronic respiratory failure with hypoxia ([place]) 12/16/2016  . Essential hypertension 11/29/2016  . Back pain 11/29/2016  . Vocal cord dysfunction   . COPD, severe ([place]) 09/24/2016  . Anxiety 09/24/2016    Past Surgical History:  Procedure Laterality Date  . BACK SURGERY    . CARDIAC CATHETERIZATION    .  COLONOSCOPY    . CYST EXCISION     removal of cyst in sinuses  . DILATION AND CURETTAGE OF UTERUS    . FOOT SURGERY Bilateral    bone removed from 4th and 5 th toes and put in pin then took pin out  . HERNIA REPAIR    . INCISION AND DRAINAGE  ~ 2014/2015   "removed mesh & infection"  . INCISIONAL HERNIA REPAIR N/A 04/04/2017   Procedure: LAPAROSCOPIC INCISIONAL HERNIA REPAIR WITH MESH;  Surgeon: Fanny Skates, MD;  Location: [facility];  Service: General;  Laterality: N/A;  . LACERATION REPAIR Right    repair wrist artery from laceration from ice skate  . LAPAROSCOPIC INCISIONAL / UMBILICAL / VENTRAL HERNIA REPAIR  04/04/2017    LAPAROSCOPIC INCISIONAL HERNIA REPAIR WITH MESH  . LIPOMA EXCISION Right    fattty lipoma in neck  . NASAL SEPTUM SURGERY    . POSTERIOR LUMBAR FUSION  2015/2016 X 2   "added rods and screws"  . TOTAL HIP ARTHROPLASTY Right 02/05/2017   Procedure: RIGHT TOTAL HIP ARTHROPLASTY;  Surgeon: [name], MD;  Location: [facility];  Service: Orthopedics;  Laterality: Right;  . TOTAL HIP ARTHROPLASTY Left 06/18/2017   Procedure: LEFT TOTAL HIP ARTHROPLASTY;  Surgeon: [name], MD;  Location: [facility];  Service: Orthopedics;  Laterality: Left;  . TUBAL LIGATION    . UMBILICAL HERNIA REPAIR  ~ 2013   w/mesh  . UPPER GI ENDOSCOPY      Current Outpatient Rx  . Order #: [id number] Class: Normal  . Order #: [id number] Class: Normal  . Order #: [id number] Class: Normal  . Order #: [id number] Class: Normal  . Order #: [id number] Class: Normal  . Order #: [id number] Class: Normal  . Order #: [id number] Class: Historical Med  . Order #: [id number] Class: Normal  . Order #: [id number] Class: Normal  . Order #: [id number] Class: Normal  . Order #: [id number] Class: Normal  . Order #: [id number] Class: Normal  . Order #: [id number] Class: Normal  . Order #: [id number] Class: Normal  . Order #: [id number] Class: Normal  . Order #: [id number] Class: Historical Med  . Order #: [id number] Class: Normal  . Order #: [id number] Class: Normal  . Order #: [id number] Class: Normal  . Order #: [id number] Class: Normal  . Order #: [id number] Class: Normal  . Order #: [id number] Class: Normal  . Order #: [id number] Class: Normal    Allergies Keflex [cephalexin]; Prednisone; Shellfish allergy; Tetracyclines & related; Dilaudid [hydromorphone hcl]; Incruse ellipta [umeclidinium bromide]; and Tuberculin tests  Family History  Problem Relation Age of Onset  . Diabetes Mother   . Hypertension Mother   . Hypertension Father   . Cancer Father        lung    Social History Social History   Tobacco Use  . Smoking status: Former Smoker     Packs/day: 1.00    Years: 44.00    Pack years: 44.00    Types: Cigarettes    Last attempt to quit: 02/28/2018    Years since quitting: 0.2  . Smokeless tobacco: Never Used  . Tobacco comment: Just quit again after restarting in NOvember.  Substance Use Topics  . Alcohol use: Yes  . Drug use: Yes    Types: "Crack" cocaine    Comment: 04/04/2017 "nothing since 01/12/2012"    Review of Systems  All other systems negative except as documented in the HPI. All pertinent positives and negatives as reviewed in the HPI. ____________________________________________   PHYSICAL EXAM:  VITAL SIGNS: ED Triage Vitals  Enc Vitals Group  BP 05/20/18 1813 117/86     Pulse Rate 05/20/18 1813 82     Resp 05/20/18 1813 (!) 22     Temp 05/20/18 1813 98.5 F (36.9 C)     Temp Source 05/20/18 1813 Oral     SpO2 05/20/18 1813 92 %     Weight 05/20/18 1805 220 lb (99.8 kg)     Height 05/20/18 1805 5\' 8"  (1.727 m)   Constitutional: Alert and oriented. Well appearing and in no acute distress. Eyes: Conjunctivae are normal. PERRL. EOMI. Head: Atraumatic. Nose: No congestion/rhinnorhea. Mouth/Throat: Mucous membranes are moist.  Oropharynx non-erythematous. Neck: No stridor.  No meningeal signs.   Cardiovascular: Normal rate, regular rhythm. Good peripheral circulation. Grossly normal heart sounds.   Respiratory: tachypneic, mild resp distress with intercostal retractions. Lungs diminished bilaterally with wheezing. Gastrointestinal: Soft and nontender. No distention.  Musculoskeletal: No lower extremity tenderness nor edema. No gross deformities of extremities. Neurologic:  Normal speech and language. No gross focal neurologic deficits are appreciated.  Skin:  Skin is warm, dry and intact. No rash noted.   ____________________________________________   LABS (all labs ordered are listed, but only abnormal results are displayed)  Labs Reviewed  CBC WITH DIFFERENTIAL/PLATELET - Abnormal;  Notable for the following components:      Result Value   WBC 13.5 (*)    Neutro Abs 9.6 (*)    All other components within normal limits  COMPREHENSIVE METABOLIC PANEL - Abnormal; Notable for the following components:   Sodium 133 (*)    Potassium 2.7 (*)    Chloride 95 (*)    Glucose, Bld 140 (*)    Creatinine, Ser 1.17 (*)    Alkaline Phosphatase 127 (*)    GFR calc non Af Amer 52 (*)    All other components within normal limits  MAGNESIUM   ____________________________________________  EKG   EKG Interpretation  Date/Time:  Wednesday May 20 2018 18:12:21 EDT Ventricular Rate:  84 PR Interval:    QRS Duration: 109 QT Interval:  361 QTC Calculation: 427 R Axis:   66 Text Interpretation:  Sinus rhythm Borderline T abnormalities, diffuse leads No significant change since last tracing Confirmed by Merrily Pew [phone]) on 05/20/2018 7:50:03 PM       ____________________________________________  RADIOLOGY  Dg Chest Portable 1 View  Result Date: 05/20/2018 CLINICAL DATA:  Shortness of breath. EXAM: PORTABLE CHEST 1 VIEW COMPARISON:  Chest x-ray dated December 28, 2017. FINDINGS: The heart size and mediastinal contours are within normal limits. Normal pulmonary vascularity. The lungs remain mildly hyperinflated with emphysematous changes and coarsened interstitial markings at the lung bases. No focal consolidation, pleural effusion, or pneumothorax. No acute osseous abnormality. IMPRESSION: 1. No active disease. 2. COPD. Electronically Signed   By: Titus Dubin M.D.   On: 05/20/2018 18:49    ____________________________________________   PROCEDURES  Procedure(s) performed:   Procedures  CRITICAL CARE Performed by: Merrily Pew Total critical care time: 35 minutes Critical care time was exclusive of separately billable procedures and treating other patients. Critical care was necessary to treat or prevent imminent or life-threatening deterioration. Critical care  was time spent personally by me on the following activities: development of treatment plan with patient and/or surrogate as well as nursing, discussions with consultants, evaluation of patient's response to treatment, examination of patient, obtaining history from patient or surrogate, ordering and performing treatments and interventions, ordering and review of laboratory studies, ordering and review of radiographic studies, pulse oximetry and re-evaluation of patient's  condition.  ____________________________________________   INITIAL IMPRESSION / ASSESSMENT AND PLAN / ED COURSE  Patient with likely COPD exacerbation and hypoxia.  Patient having difficulty speaking in full sentences without stopping to take a breath.  Her oxygen saturation on room air while speaking gets as low as 91% and with movement the bed she gets very tachypneic as well.  She said 20 mg of albuterol and is still hypoxic and mild respiratory distress so will need to be admitted to hospital.  She is already had steroids there is no indication for antibiotics at this point.  EKG was reviewed by myself and does not show any evidence of acute infarct or ischemia.  Chest x-ray reviewed by myself no obvious consolidations to suggest [facility] pneumonia.     Pertinent labs & imaging results that were available during my care of the patient were reviewed by me and considered in my medical decision making (see chart for details).  ____________________________________________  FINAL CLINICAL IMPRESSION(S) / ED DIAGNOSES  Final diagnoses:  Hypoxia  COPD exacerbation ([place])     MEDICATIONS GIVEN DURING THIS VISIT:  Medications  albuterol (PROVENTIL,VENTOLIN) solution continuous neb (0 mg/hr Nebulization Stopped 05/20/18 2113)  benzonatate (TESSALON) capsule 100 mg (100 mg Oral Given 05/20/18 1818)  ipratropium (ATROVENT) nebulizer solution 1 mg (1 mg Nebulization Given 05/20/18 1821)  potassium chloride 10 mEq in 100 mL IVPB (0  mEq Intravenous Stopped 05/20/18 2102)  potassium chloride SA (K-DUR,KLOR-CON) CR tablet 40 mEq (40 mEq Oral Given 05/20/18 2007)  magnesium sulfate IVPB 2 g 50 mL (0 g Intravenous Stopped 05/20/18 2102)     NEW OUTPATIENT MEDICATIONS STARTED DURING THIS VISIT:  New Prescriptions   No medications on file    Note:  This note was prepared with assistance of Dragon voice recognition software. Occasional wrong-word or sound-a-like substitutions may have occurred due to the inherent limitations of voice recognition software.   Merrily Pew, MD 05/20/18 2153

## 2018-05-21 ENCOUNTER — Encounter: Payer: Self-pay | Admitting: Acute Care

## 2018-05-21 DIAGNOSIS — T502X5A Adverse effect of carbonic-anhydrase inhibitors, benzothiadiazides and other diuretics, initial encounter: Secondary | ICD-10-CM | POA: Diagnosis present

## 2018-05-21 DIAGNOSIS — E785 Hyperlipidemia, unspecified: Secondary | ICD-10-CM | POA: Diagnosis present

## 2018-05-21 DIAGNOSIS — Z79890 Hormone replacement therapy (postmenopausal): Secondary | ICD-10-CM | POA: Diagnosis not present

## 2018-05-21 DIAGNOSIS — Z833 Family history of diabetes mellitus: Secondary | ICD-10-CM | POA: Diagnosis not present

## 2018-05-21 DIAGNOSIS — T380X5A Adverse effect of glucocorticoids and synthetic analogues, initial encounter: Secondary | ICD-10-CM | POA: Diagnosis present

## 2018-05-21 DIAGNOSIS — J441 Chronic obstructive pulmonary disease with (acute) exacerbation: Principal | ICD-10-CM

## 2018-05-21 DIAGNOSIS — Z9989 Dependence on other enabling machines and devices: Secondary | ICD-10-CM

## 2018-05-21 DIAGNOSIS — F17210 Nicotine dependence, cigarettes, uncomplicated: Secondary | ICD-10-CM | POA: Diagnosis present

## 2018-05-21 DIAGNOSIS — Z91013 Allergy to seafood: Secondary | ICD-10-CM | POA: Diagnosis not present

## 2018-05-21 DIAGNOSIS — Z87891 Personal history of nicotine dependence: Secondary | ICD-10-CM | POA: Diagnosis not present

## 2018-05-21 DIAGNOSIS — Z79899 Other long term (current) drug therapy: Secondary | ICD-10-CM | POA: Diagnosis not present

## 2018-05-21 DIAGNOSIS — F319 Bipolar disorder, unspecified: Secondary | ICD-10-CM | POA: Diagnosis present

## 2018-05-21 DIAGNOSIS — G4733 Obstructive sleep apnea (adult) (pediatric): Secondary | ICD-10-CM

## 2018-05-21 DIAGNOSIS — E7800 Pure hypercholesterolemia, unspecified: Secondary | ICD-10-CM | POA: Diagnosis present

## 2018-05-21 DIAGNOSIS — N182 Chronic kidney disease, stage 2 (mild): Secondary | ICD-10-CM | POA: Diagnosis present

## 2018-05-21 DIAGNOSIS — J9621 Acute and chronic respiratory failure with hypoxia: Secondary | ICD-10-CM | POA: Diagnosis present

## 2018-05-21 DIAGNOSIS — Z6835 Body mass index (BMI) 35.0-35.9, adult: Secondary | ICD-10-CM

## 2018-05-21 DIAGNOSIS — E034 Atrophy of thyroid (acquired): Secondary | ICD-10-CM

## 2018-05-21 DIAGNOSIS — E1122 Type 2 diabetes mellitus with diabetic chronic kidney disease: Secondary | ICD-10-CM | POA: Diagnosis present

## 2018-05-21 DIAGNOSIS — Z888 Allergy status to other drugs, medicaments and biological substances status: Secondary | ICD-10-CM | POA: Diagnosis not present

## 2018-05-21 DIAGNOSIS — E1142 Type 2 diabetes mellitus with diabetic polyneuropathy: Secondary | ICD-10-CM | POA: Diagnosis present

## 2018-05-21 DIAGNOSIS — Z9981 Dependence on supplemental oxygen: Secondary | ICD-10-CM | POA: Diagnosis not present

## 2018-05-21 DIAGNOSIS — I129 Hypertensive chronic kidney disease with stage 1 through stage 4 chronic kidney disease, or unspecified chronic kidney disease: Secondary | ICD-10-CM | POA: Diagnosis present

## 2018-05-21 DIAGNOSIS — E876 Hypokalemia: Secondary | ICD-10-CM | POA: Diagnosis present

## 2018-05-21 DIAGNOSIS — Z96643 Presence of artificial hip joint, bilateral: Secondary | ICD-10-CM | POA: Diagnosis present

## 2018-05-21 DIAGNOSIS — Z7951 Long term (current) use of inhaled steroids: Secondary | ICD-10-CM | POA: Diagnosis not present

## 2018-05-21 DIAGNOSIS — R0902 Hypoxemia: Secondary | ICD-10-CM | POA: Diagnosis present

## 2018-05-21 LAB — CBC
HCT: 39.9 % (ref 36.0–46.0)
Hemoglobin: 13.2 g/dL (ref 12.0–15.0)
MCH: 29.5 pg (ref 26.0–34.0)
MCHC: 33.1 g/dL (ref 30.0–36.0)
MCV: 89.3 fL (ref 80.0–100.0)
Platelets: 286 10*3/uL (ref 150–400)
RBC: 4.47 MIL/uL (ref 3.87–5.11)
RDW: 14.7 % (ref 11.5–15.5)
WBC: 14 10*3/uL — ABNORMAL HIGH (ref 4.0–10.5)
nRBC: 0 % (ref 0.0–0.2)

## 2018-05-21 LAB — BASIC METABOLIC PANEL
Anion gap: 16 — ABNORMAL HIGH (ref 5–15)
BUN: 14 mg/dL (ref 6–20)
CO2: 25 mmol/L (ref 22–32)
Calcium: 9.1 mg/dL (ref 8.9–10.3)
Chloride: 91 mmol/L — ABNORMAL LOW (ref 98–111)
Creatinine, Ser: 1.28 mg/dL — ABNORMAL HIGH (ref 0.44–1.00)
GFR calc Af Amer: 54 mL/min — ABNORMAL LOW (ref >60)
GFR calc non Af Amer: 47 mL/min — ABNORMAL LOW (ref >60)
Glucose, Bld: 322 mg/dL — ABNORMAL HIGH (ref 70–99)
Potassium: 3.3 mmol/L — ABNORMAL LOW (ref 3.5–5.1)
Sodium: 132 mmol/L — ABNORMAL LOW (ref 135–145)

## 2018-05-21 LAB — GLUCOSE, CAPILLARY
Glucose-Capillary: 301 mg/dL — ABNORMAL HIGH (ref 70–99)
Glucose-Capillary: 266 mg/dL — ABNORMAL HIGH (ref 70–99)
Glucose-Capillary: 191 mg/dL — ABNORMAL HIGH (ref 70–99)
Glucose-Capillary: 198 mg/dL — ABNORMAL HIGH (ref 70–99)

## 2018-05-21 MED ORDER — INSULIN ASPART 100 UNIT/ML ~~LOC~~ SOLN
0.0000 [IU] | Freq: Every day | SUBCUTANEOUS | Status: DC
  Administered 2018-05-22: 3 [IU] via SUBCUTANEOUS
  Administered 2018-05-23: 4 [IU] via SUBCUTANEOUS

## 2018-05-21 MED ORDER — METHYLPREDNISOLONE SODIUM SUCC 125 MG IJ SOLR
60.0000 mg | Freq: Four times a day (QID) | INTRAMUSCULAR | Status: DC
  Administered 2018-05-21 – 2018-05-22 (×4): 60 mg via INTRAVENOUS
  Filled 2018-05-21 (×3): qty 2

## 2018-05-21 MED ORDER — INSULIN ASPART 100 UNIT/ML ~~LOC~~ SOLN
0.0000 [IU] | Freq: Three times a day (TID) | SUBCUTANEOUS | Status: DC
  Administered 2018-05-21: 4 [IU] via SUBCUTANEOUS
  Administered 2018-05-21: 11 [IU] via SUBCUTANEOUS
  Administered 2018-05-22: 15 [IU] via SUBCUTANEOUS
  Administered 2018-05-22 (×2): 7 [IU] via SUBCUTANEOUS
  Administered 2018-05-23: 20 [IU] via SUBCUTANEOUS
  Administered 2018-05-23: 7 [IU] via SUBCUTANEOUS
  Administered 2018-05-24: 4 [IU] via SUBCUTANEOUS

## 2018-05-21 MED ORDER — HYDROCOD POLST-CPM POLST ER 10-8 MG/5ML PO SUER
5.0000 mL | Freq: Two times a day (BID) | ORAL | Status: DC | PRN
  Administered 2018-05-22 – 2018-05-23 (×3): 5 mL via ORAL
  Filled 2018-05-21 (×3): qty 5

## 2018-05-21 MED ORDER — DIPHENHYDRAMINE HCL 25 MG PO CAPS
25.0000 mg | ORAL_CAPSULE | Freq: Two times a day (BID) | ORAL | Status: DC | PRN
  Administered 2018-05-21: 25 mg via ORAL
  Filled 2018-05-21: qty 1

## 2018-05-21 MED ORDER — GUAIFENESIN 100 MG/5ML PO SOLN
5.0000 mL | ORAL | Status: DC | PRN
  Administered 2018-05-21 – 2018-05-23 (×5): 100 mg via ORAL
  Filled 2018-05-21 (×5): qty 5

## 2018-05-21 MED ORDER — POTASSIUM CHLORIDE CRYS ER 20 MEQ PO TBCR
40.0000 meq | EXTENDED_RELEASE_TABLET | Freq: Once | ORAL | Status: AC
  Administered 2018-05-21: 40 meq via ORAL
  Filled 2018-05-21: qty 2

## 2018-05-21 NOTE — Progress Notes (Addendum)
Progress Note    Joan [name]  KDX:[id number] DOB: 1960/12/29  DOA: 05/20/2018 PCP: [name], MD    Brief Narrative:     Medical records reviewed and are as summarized below:  Joan [name] is an 58 y.o. female with medical history significant of chronic respiratory failure, COPD, bipolar disorder, hypertension, hypothyroidism. Patient presents secondary to 4 days of worsening shortness of breath. She has associated intermittent sputum production, dyspnea on exertion, chest pain with coughing. She has been using her home nebulizer/inhalers without much improvement in symptoms. She thinks this has been triggered by her allergies.  Assessment/Plan:   Active Problems:   Essential hypertension   Hypothyroidism due to acquired atrophy of thyroid   GERD without esophagitis   OSA on CPAP   COPD exacerbation (HCC)   Tobacco abuse  COPD exacerbation Acute on chronic respiratory failure with hypoxia (wears 2L at night?) but now requiring all day Likely triggered by allergies -Combivent -Solu-medrol 40 mg IV q 6 hours -wean O2 as tolerated -added cough syrup with codeine  OSA -CPAP  Hypokalemia -replete -due to HCTZ  Hyperlipidemia -Continue Lipitor  Hypertension -Continue atenolol and hydrochlorothiazide  Hypothyroidism -Continue Synthroid  Peripheral neuropathy -says she is on lyrica but has not gotten filled lately since her insurance changed and she can not afford  Steroid induced diabetes Not on medication as an outpatient. -SSI while inpatient -Carb modified diet since starting steroids  Allergic rhinitis -Continue Singulair  Obesity Estimated body mass index is 33.45 kg/m as calculated from the following:   Height as of this encounter: 5\' 8"  (1.727 m).   Weight as of this encounter: 99.8 kg.  Family Communication/Anticipated D/C date and plan/Code Status   DVT prophylaxis: Lovenox ordered. Code Status: Full Code.  Family  Communication:  Disposition Plan: needs continued IV Steroids   Medical Consultants:      Subjective:   Asking for cough syrup with codeine-- says last time she was here she got that  Objective:    Vitals:   05/20/18 2220 05/21/18 0200 05/21/18 0544 05/21/18 0840  BP: 124/80  (!) 141/83   Pulse: 87 88 91   Resp: 18 18 18    Temp: 98.3 F (36.8 C)  98.1 F (36.7 C)   TempSrc: Oral  Oral   SpO2: 94% 95% 99% (!) 89%  Weight:      Height:       No intake or output data in the 24 hours ending 05/21/18 0858 Filed Weights   05/20/18 1805  Weight: 99.8 kg    Exam: In bed, older than stated age Diffuse wheezing in all lung fields rrr +BS, soft +BS, obese  Data Reviewed:   I have personally reviewed following labs and imaging studies:  Labs: Labs show the following:   Basic Metabolic Panel: Recent Labs  Lab 05/20/18 1844 05/20/18 1959 05/21/18 0420  NA 133*  --  132*  K 2.7*  --  3.3*  CL 95*  --  91*  CO2 27  --  25  GLUCOSE 140*  --  322*  BUN 11  --  14  CREATININE 1.17*  --  1.28*  CALCIUM 9.0  --  9.1  MG  --  1.9  --    GFR Estimated Creatinine Clearance: 60.7 mL/min (A) (by C-G formula based on SCr of 1.28 mg/dL (H)). Liver Function Tests: Recent Labs  Lab 05/20/18 1844  AST 16  ALT 14  ALKPHOS 127*  BILITOT  0.4  PROT 7.0  ALBUMIN 3.9   No results for input(s): LIPASE, AMYLASE in the last 168 hours. No results for input(s): AMMONIA in the last 168 hours. Coagulation profile No results for input(s): INR, PROTIME in the last 168 hours.  CBC: Recent Labs  Lab 05/20/18 1844 05/21/18 0420  WBC 13.5* 14.0*  NEUTROABS 9.6*  --   HGB 12.8 13.2  HCT 40.0 39.9  MCV 90.7 89.3  PLT 260 286   Cardiac Enzymes: No results for input(s): CKTOTAL, CKMB, CKMBINDEX, TROPONINI in the last 168 hours. BNP (last 3 results) No results for input(s): PROBNP in the last 8760 hours. CBG: Recent Labs  Lab 05/20/18 2305 05/21/18 0648  GLUCAP 265*  301*   D-Dimer: No results for input(s): DDIMER in the last 72 hours. Hgb A1c: No results for input(s): HGBA1C in the last 72 hours. Lipid Profile: No results for input(s): CHOL, HDL, LDLCALC, TRIG, CHOLHDL, LDLDIRECT in the last 72 hours. Thyroid function studies: No results for input(s): TSH, T4TOTAL, T3FREE, THYROIDAB in the last 72 hours.  Invalid input(s): FREET3 Anemia work up: No results for input(s): VITAMINB12, FOLATE, FERRITIN, TIBC, IRON, RETICCTPCT in the last 72 hours. Sepsis Labs: Recent Labs  Lab 05/20/18 1844 05/21/18 0420  WBC 13.5* 14.0*    Microbiology No results found for this or any previous visit (from the past 240 hour(s)).  Procedures and diagnostic studies:  Dg Chest Portable 1 View  Result Date: 05/20/2018 CLINICAL DATA:  Shortness of breath. EXAM: PORTABLE CHEST 1 VIEW COMPARISON:  Chest x-ray dated December 28, 2017. FINDINGS: The heart size and mediastinal contours are within normal limits. Normal pulmonary vascularity. The lungs remain mildly hyperinflated with emphysematous changes and coarsened interstitial markings at the lung bases. No focal consolidation, pleural effusion, or pneumothorax. No acute osseous abnormality. IMPRESSION: 1. No active disease. 2. COPD. Electronically Signed   By: Titus Dubin M.D.   On: 05/20/2018 18:49    Medications:   . atenolol  25 mg Oral Daily  . atorvastatin  20 mg Oral Daily  . buPROPion  150 mg Oral Daily  . cyclobenzaprine  10 mg Oral QHS  . enoxaparin (LOVENOX) injection  40 mg Subcutaneous Daily  . fluticasone furoate-vilanterol  1 puff Inhalation Daily  . gabapentin  300 mg Oral BID  . insulin aspart  0-9 Units Subcutaneous TID WC  . ipratropium-albuterol  3 mL Inhalation QID  . levothyroxine  25 mcg Oral QAC breakfast  . methylPREDNISolone (SOLU-MEDROL) injection  60 mg Intravenous Q6H  . montelukast  10 mg Oral QHS  . pantoprazole  40 mg Oral Daily  . roflumilast  500 mcg Oral Daily  .  traZODone  100 mg Oral QHS  . valACYclovir  500 mg Oral Daily  . venlafaxine XR  150 mg Oral Q breakfast   Continuous Infusions:   LOS: 0 days   Geradine Girt  [facility]   How to contact the [facility] Attending or Consulting provider [place] or covering provider during after hours [place], for this patient?  1. Check the care team in [facility] and look for a) attending/consulting [facility] provider listed and b) the [facility] team listed 2. Log into [email or web address] and use [place]'s universal password to access. If you do not have the password, please contact the hospital operator. 3. Locate the [facility] provider you are looking for under [facility] and page to a number that you can be directly reached. 4. If you still have  difficulty reaching the provider, please page the [facility] (Director on Call) for the Hospitalists listed on amion for assistance.  05/21/2018, 8:58 AM

## 2018-05-21 NOTE — Plan of Care (Signed)
  Problem: Activity: Goal: Risk for activity intolerance will decrease Outcome: Progressing   Problem: Nutrition: Goal: Adequate nutrition will be maintained Outcome: Progressing   Problem: Coping: Goal: Level of anxiety will decrease Outcome: Progressing   Problem: Safety: Goal: Ability to remain free from injury will improve Outcome: Progressing   Problem: Clinical Measurements: Goal: Respiratory complications will improve Outcome: Progressing   Problem: Skin Integrity: Goal: Risk for impaired skin integrity will decrease Outcome: Progressing   Problem: Safety: Goal: Ability to remain free from injury will improve Outcome: Progressing   Problem: Pain Managment: Goal: General experience of comfort will improve Outcome: Progressing

## 2018-05-21 NOTE — Progress Notes (Signed)
[facility] notified that patient is request C-pap. Arthor Captain LPN

## 2018-05-22 ENCOUNTER — Encounter (HOSPITAL_COMMUNITY): Payer: Self-pay | Admitting: *Deleted

## 2018-05-22 ENCOUNTER — Other Ambulatory Visit: Payer: Self-pay

## 2018-05-22 DIAGNOSIS — J9621 Acute and chronic respiratory failure with hypoxia: Secondary | ICD-10-CM

## 2018-05-22 LAB — GLUCOSE, CAPILLARY
Glucose-Capillary: 341 mg/dL — ABNORMAL HIGH (ref 70–99)
Glucose-Capillary: 228 mg/dL — ABNORMAL HIGH (ref 70–99)
Glucose-Capillary: 236 mg/dL — ABNORMAL HIGH (ref 70–99)
Glucose-Capillary: 261 mg/dL — ABNORMAL HIGH (ref 70–99)

## 2018-05-22 LAB — BASIC METABOLIC PANEL
Anion gap: 12 (ref 5–15)
BUN: 22 mg/dL — ABNORMAL HIGH (ref 6–20)
CO2: 25 mmol/L (ref 22–32)
Calcium: 9.4 mg/dL (ref 8.9–10.3)
Chloride: 95 mmol/L — ABNORMAL LOW (ref 98–111)
Creatinine, Ser: 1.2 mg/dL — ABNORMAL HIGH (ref 0.44–1.00)
GFR calc Af Amer: 59 mL/min — ABNORMAL LOW (ref >60)
GFR calc non Af Amer: 50 mL/min — ABNORMAL LOW (ref >60)
Glucose, Bld: 251 mg/dL — ABNORMAL HIGH (ref 70–99)
Potassium: 4.3 mmol/L (ref 3.5–5.1)
Sodium: 132 mmol/L — ABNORMAL LOW (ref 135–145)

## 2018-05-22 LAB — CBC
HCT: 38.8 % (ref 36.0–46.0)
Hemoglobin: 12.7 g/dL (ref 12.0–15.0)
MCH: 29.8 pg (ref 26.0–34.0)
MCHC: 32.7 g/dL (ref 30.0–36.0)
MCV: 91.1 fL (ref 80.0–100.0)
Platelets: 306 10*3/uL (ref 150–400)
RBC: 4.26 MIL/uL (ref 3.87–5.11)
RDW: 15 % (ref 11.5–15.5)
WBC: 21.3 10*3/uL — ABNORMAL HIGH (ref 4.0–10.5)
nRBC: 0 % (ref 0.0–0.2)

## 2018-05-22 LAB — HEMOGLOBIN A1C
Hgb A1c MFr Bld: 7 % — ABNORMAL HIGH (ref 4.8–5.6)
Mean Plasma Glucose: 154.2 mg/dL

## 2018-05-22 LAB — MRSA PCR SCREENING: MRSA by PCR: NEGATIVE

## 2018-05-22 MED ORDER — MENTHOL 3 MG MT LOZG: 1.0000 | LOZENGE | OROMUCOSAL | Status: DC | PRN

## 2018-05-22 MED ORDER — INSULIN GLARGINE 100 UNIT/ML ~~LOC~~ SOLN
7.0000 [IU] | Freq: Every day | SUBCUTANEOUS | Status: DC
  Administered 2018-05-22: 7 [IU] via SUBCUTANEOUS
  Filled 2018-05-22: qty 0.07

## 2018-05-22 MED ORDER — METHYLPREDNISOLONE SODIUM SUCC 125 MG IJ SOLR
60.0000 mg | Freq: Three times a day (TID) | INTRAMUSCULAR | Status: DC
  Administered 2018-05-22 – 2018-05-23 (×2): 60 mg via INTRAVENOUS
  Filled 2018-05-22 (×2): qty 2

## 2018-05-22 MED ORDER — IPRATROPIUM-ALBUTEROL 0.5-2.5 (3) MG/3ML IN SOLN
3.0000 mL | Freq: Three times a day (TID) | RESPIRATORY_TRACT | Status: DC
  Administered 2018-05-22 – 2018-05-24 (×6): 3 mL via RESPIRATORY_TRACT
  Filled 2018-05-22 (×6): qty 3

## 2018-05-22 NOTE — Progress Notes (Signed)
Patient asked if she was on any antibiotics, specifically Levaquin. Patient informed that she was not on any antibiotics. This RN asked the patient why she felt that she needed to be on antibiotics. Patient replied, "I am coughing up yellow stuff and Levaquin especially the IV stuff works quick to knock this stuff out. Trust me, I know what I am talking about." I informed patient that I would speak with Dr. Eliseo Squires and she would be the one to decide if the patient needed to be on antibiotics. MD made aware. No further orders.

## 2018-05-22 NOTE — Progress Notes (Signed)
Progress Note    Joan [name]  YYT:[id number] DOB: [birth year]  DOA: 05/20/2018 PCP: [name], MD    Brief Narrative:     Medical records reviewed and are as summarized below:  Joan [name] is an 58 y.o. female with medical history significant of chronic respiratory failure, COPD, bipolar disorder, hypertension, hypothyroidism. Patient presents secondary to 4 days of worsening shortness of breath. She has associated intermittent sputum production, dyspnea on exertion, chest pain with coughing. She has been using her home nebulizer/inhalers without much improvement in symptoms. She thinks this has been triggered by her allergies. SLow to recover.  States she has not been out of her house in last few weeks and no COVID-19 contacts.  Assessment/Plan:   Active Problems:   Essential hypertension   Hypothyroidism due to acquired atrophy of thyroid   GERD without esophagitis   OSA on CPAP   COPD exacerbation (HCC)   Tobacco abuse   Acute on chronic respiratory failure with hypoxia (HCC)  COPD exacerbation Acute on chronic respiratory failure with hypoxia (wears 2L at night?) but now requiring all day Likely triggered by allergies -Combivent -Solu-medrol 40 mg IV q 6 hours- will wean to 8 hours -wean O2 as tolerated -added cough syrup with codeine -has "allergy" to prednisone (last time she was here, was sent out on a medrol dose pack and tolerated well)  OSA -CPAP  Hypokalemia -replete -due to HCTZ  Hyperlipidemia -Continue Lipitor  Hypertension -Continue atenolol and hydrochlorothiazide  Hypothyroidism -Continue Synthroid  Peripheral neuropathy -says she is on lyrica but has not gotten filled lately since her insurance changed and she can not afford  Steroid induced diabetes Not on medication as an outpatient. -SSI while inpatient -Carb modified diet since starting steroids -add lantus for coverage while in the hospital  Allergic rhinitis  -Continue Singulair  Obesity Estimated body mass index is 32.95 kg/m as calculated from the following:   Height as of this encounter: 5\' 8"  (1.727 m).   Weight as of this encounter: 98.3 kg.  Family Communication/Anticipated D/C date and plan/Code Status   DVT prophylaxis: Lovenox ordered. Code Status: Full Code.  Family Communication:  Disposition Plan: needs continued IV Steroids   Medical Consultants:      Subjective:   Asking for cough syrup with codeine-- says last time she was here she got that  Objective:    Vitals:   05/22/18 0408 05/22/18 0409 05/22/18 0753 05/22/18 0923  BP: 104/78   (!) 143/101  Pulse: 83 76  (!) 106  Resp: (!) 21     Temp: 97.7 F (36.5 C)   (!) 97.5 F (36.4 C)  TempSrc: [place]   Oral  SpO2: 90% 90% 90% 91%  Weight:      Height:        Intake/Output Summary (Last 24 hours) at 05/22/2018 2751 Last data filed at 05/21/2018 2200 Gross per 24 hour  Intake 780 ml  Output 0 ml  Net 780 ml   Filed Weights   05/20/18 1805 05/21/18 2105  Weight: 99.8 kg 98.3 kg    Exam: Awake, less cough Lessened wheezing though out but still tight, on 2LNC +BS, soft, obese Alert and oriented  Data Reviewed:   I have personally reviewed following labs and imaging studies:  Labs: Labs show the following:   Basic Metabolic Panel: Recent Labs  Lab 05/20/18 1844 05/20/18 1959 05/21/18 0420 05/22/18 0300  NA 133*  --  132* 132*  K  2.7*  --  3.3* 4.3  CL 95*  --  91* 95*  CO2 27  --  25 25  GLUCOSE 140*  --  322* 251*  BUN 11  --  14 22*  CREATININE 1.17*  --  1.28* 1.20*  CALCIUM 9.0  --  9.1 9.4  MG  --  1.9  --   --    GFR Estimated Creatinine Clearance: 64.2 mL/min (A) (by C-G formula based on SCr of 1.2 mg/dL (H)). Liver Function Tests: Recent Labs  Lab 05/20/18 1844  AST 16  ALT 14  ALKPHOS 127*  BILITOT 0.4  PROT 7.0  ALBUMIN 3.9   No results for input(s): LIPASE, AMYLASE in the last 168 hours. No results for  input(s): AMMONIA in the last 168 hours. Coagulation profile No results for input(s): INR, PROTIME in the last 168 hours.  CBC: Recent Labs  Lab 05/20/18 1844 05/21/18 0420 05/22/18 0300  WBC 13.5* 14.0* 21.3*  NEUTROABS 9.6*  --   --   HGB 12.8 13.2 12.7  HCT 40.0 39.9 38.8  MCV 90.7 89.3 91.1  PLT 260 286 306   Cardiac Enzymes: No results for input(s): CKTOTAL, CKMB, CKMBINDEX, TROPONINI in the last 168 hours. BNP (last 3 results) No results for input(s): PROBNP in the last 8760 hours. CBG: Recent Labs  Lab 05/21/18 0648 05/21/18 1145 05/21/18 1621 05/21/18 2105 05/22/18 0736  GLUCAP 301* 266* 191* 198* 341*   D-Dimer: No results for input(s): DDIMER in the last 72 hours. Hgb A1c: Recent Labs    05/22/18 0300  HGBA1C 7.0*   Lipid Profile: No results for input(s): CHOL, HDL, LDLCALC, TRIG, CHOLHDL, LDLDIRECT in the last 72 hours. Thyroid function studies: No results for input(s): TSH, T4TOTAL, T3FREE, THYROIDAB in the last 72 hours.  Invalid input(s): FREET3 Anemia work up: No results for input(s): VITAMINB12, FOLATE, FERRITIN, TIBC, IRON, RETICCTPCT in the last 72 hours. Sepsis Labs: Recent Labs  Lab 05/20/18 1844 05/21/18 0420 05/22/18 0300  WBC 13.5* 14.0* 21.3*    Microbiology Recent Results (from the past 240 hour(s))  MRSA PCR Screening     Status: None   Collection Time: 05/22/18 12:07 AM  Result Value Ref Range Status   MRSA by PCR NEGATIVE NEGATIVE Final    Comment:        The GeneXpert MRSA Assay (FDA approved for NASAL specimens only), is one component of a comprehensive MRSA colonization surveillance program. It is not intended to diagnose MRSA infection nor to guide or monitor treatment for MRSA infections. Performed at [facility], [place] [address], [place], [place] [address]     Procedures and diagnostic studies:  Dg Chest Portable 1 View  Result Date: 05/20/2018 CLINICAL DATA:  Shortness of breath. EXAM: PORTABLE  CHEST 1 VIEW COMPARISON:  Chest x-ray dated December 28, 2017. FINDINGS: The heart size and mediastinal contours are within normal limits. Normal pulmonary vascularity. The lungs remain mildly hyperinflated with emphysematous changes and coarsened interstitial markings at the lung bases. No focal consolidation, pleural effusion, or pneumothorax. No acute osseous abnormality. IMPRESSION: 1. No active disease. 2. COPD. Electronically Signed   By: Titus Dubin M.D.   On: 05/20/2018 18:49    Medications:   . atenolol  25 mg Oral Daily  . atorvastatin  20 mg Oral Daily  . buPROPion  150 mg Oral Daily  . cyclobenzaprine  10 mg Oral QHS  . enoxaparin (LOVENOX) injection  40 mg Subcutaneous Daily  . fluticasone  furoate-vilanterol  1 puff Inhalation Daily  . gabapentin  300 mg Oral BID  . insulin aspart  0-20 Units Subcutaneous TID WC  . insulin aspart  0-5 Units Subcutaneous QHS  . ipratropium-albuterol  3 mL Inhalation QID  . levothyroxine  25 mcg Oral QAC breakfast  . methylPREDNISolone (SOLU-MEDROL) injection  60 mg Intravenous Q6H  . montelukast  10 mg Oral QHS  . pantoprazole  40 mg Oral Daily  . roflumilast  500 mcg Oral Daily  . traZODone  100 mg Oral QHS  . valACYclovir  500 mg Oral Daily  . venlafaxine XR  150 mg Oral Q breakfast   Continuous Infusions:   LOS: 1 day   Geradine Girt  [facility]   How to contact the [facility] Attending or Consulting provider [place] or covering provider during after hours [place], for this patient?  1. Check the care team in [facility] and look for a) attending/consulting TR[facility] provider listed and b) the [facility] team listed 2. Log into [email or web address] and use [place]'s universal password to access. If you do not have the password, please contact the hospital operator. 3. Locate the [facility] provider you are looking for under [facility] and page to a number that you can be directly reached. 4. If you still have difficulty reaching the provider, please  page the [facility] (Director on Call) for the Hospitalists listed on amion for assistance.  05/22/2018, 9:38 AM

## 2018-05-22 NOTE — TOC Initial Note (Signed)
Transition of Care [facility]) - Initial/Assessment Note    Patient Details  Name: Joan [name] MRN: [id number] Date of Birth: 02-11-1961  [facility] CM/SW Contact:    Bartholomew Crews, RN Phone Number: 05/22/2018, 4:14 PM  Clinical Narrative:                 Spoke with patient at bedside. States that she currently lives with her brother and sister. Her brother is her 3 - discussed getting a copy of the paperwork.   Patient has PCP and pulmonary doctor. States that some of her pulmonary medication is expensive, but her plan is to sit down with her pulmonary doctor to decide which medications are best for her in accordance with her insurance plan. Patient also asked about going to pulmonary rehabilitation outpatient. She also wants to know about getting a hospital bed that is wide enough for her and will allow her to sleep in a sitting position.   Uses CPAP at home with O2 at 2 l with cpap at night and as needed during the day. She also states that she has a nebulizer. Reports getting her equipment from [facility] (now [facility]). States that she has had skilled care through [facility], but is not currently receiving therapy. If needed she would like to use [facility] again, however, she is unsure if they are inn with her insurance. She also states that she has had [facility] check on her when her brother and sister go out of town.   She states that either her brother or sister will provide transportation home, and to her medical appointments.   CM to follow for transition of care needs.   Expected Discharge Plan: Home/Self Care Barriers to Discharge: No Barriers Identified   Patient Goals and CMS Choice        Expected Discharge Plan and Services Expected Discharge Plan: Home/Self Care In-house Referral: NA Discharge Planning Services: CM Consult                              Prior Living Arrangements/Services   Lives with:: Self, Siblings(lives with  brother and sister) Patient language and need for interpreter reviewed:: Yes Do you feel safe going back to the place where you live?: Yes      Need for Family Participation in Patient Care: Yes (Comment) Care giver support system in place?: Yes (comment) Current home services: DME Criminal Activity/Legal Involvement Pertinent to Current Situation/Hospitalization: No - Comment as needed  Activities of Daily Living      Permission Sought/Granted Permission sought to share information with : Family Supports(brother is her HCPOA) Permission granted to share information with : Yes, Verbal Permission Granted        Permission granted to share info w Relationship: brother     Emotional Assessment Appearance:: Appears older than stated age Attitude/Demeanor/Rapport: Engaged   Orientation: : Oriented to  Time, Oriented to Self, Oriented to Place, Oriented to Situation   Psych Involvement: No (comment)  Admission diagnosis:  Hypoxia [R09.02] COPD exacerbation ([place]) [J44.1] Patient Active Problem List   Diagnosis Date Noted  . Acute on chronic respiratory failure with hypoxia ([place]) 05/21/2018  . Cough 03/25/2018  . Tobacco abuse 01/15/2018  . Hypomagnesemia 12/29/2017  . COPD exacerbation ([place]) 12/28/2017  . Insomnia 12/11/2017  . Pulmonary nodule 11/11/2017  . Carotid artery disease ([place]) 11/05/2017  . Hoarseness 09/09/2017  . OSA on CPAP 08/07/2017  .  Family history of heart disease 07/18/2017  . GERD without esophagitis 06/23/2017  . H/O total hip arthroplasty, left 06/18/2017  . Hypothyroidism due to acquired atrophy of thyroid 04/05/2017  . Primary osteoarthritis involving multiple joints 04/05/2017  . Incarcerated ventral hernia 04/04/2017  . Chronic constipation 02/10/2017  . Iron deficiency anemia due to dietary causes 02/10/2017  . Osteoarthritis 02/10/2017  . Dyslipidemia 02/10/2017  . H/O total hip arthroplasty, right 02/05/2017  . Rash 01/08/2017  .  Pre-operative clearance 12/16/2016  . Chronic respiratory failure with hypoxia ([place]) 12/16/2016  . Essential hypertension 11/29/2016  . Back pain 11/29/2016  . Vocal cord dysfunction   . COPD, severe ([place]) 09/24/2016  . Anxiety 09/24/2016   PCP:  [name], MD Pharmacy:   [id number] [place], [place] [address] [place] [address] Phone: [phone] Fax: [phone]  [place], [facility] [address] [place] [address] [place] [address] Phone: [phone] Fax: [phone]     Social Determinants of Health ([place]) Interventions    Readmission Risk Interventions No flowsheet data found.

## 2018-05-22 NOTE — Progress Notes (Signed)
Patients states that she will wear her CPAP tonight once she is ready for bed. Scheduled Neb running at this time.

## 2018-05-22 NOTE — Telephone Encounter (Signed)
Patient wants a visit as she is currently in hospital for a long exacerbation.  She is wanting to discuss her medications and which are covered by insurance. I told patient to give [place] a call when she gets out of hospital to make a telephone visit with Judson Roch.   Will route to Judson Roch as Juluis Rainier

## 2018-05-23 ENCOUNTER — Encounter (HOSPITAL_COMMUNITY): Payer: Self-pay | Admitting: Emergency Medicine

## 2018-05-23 LAB — CBC WITH DIFFERENTIAL/PLATELET
Abs Immature Granulocytes: 0.38 10*3/uL — ABNORMAL HIGH (ref 0.00–0.07)
Basophils Absolute: 0 10*3/uL (ref 0.0–0.1)
Basophils Relative: 0 %
Eosinophils Absolute: 0 10*3/uL (ref 0.0–0.5)
Eosinophils Relative: 0 %
HCT: 40.5 % (ref 36.0–46.0)
Hemoglobin: 12.9 g/dL (ref 12.0–15.0)
Immature Granulocytes: 2 %
Lymphocytes Relative: 8 %
Lymphs Abs: 1.4 10*3/uL (ref 0.7–4.0)
MCH: 29.1 pg (ref 26.0–34.0)
MCHC: 31.9 g/dL (ref 30.0–36.0)
MCV: 91.4 fL (ref 80.0–100.0)
Monocytes Absolute: 0.7 10*3/uL (ref 0.1–1.0)
Monocytes Relative: 4 %
Neutro Abs: 15.2 10*3/uL — ABNORMAL HIGH (ref 1.7–7.7)
Neutrophils Relative %: 86 %
Platelets: 328 10*3/uL (ref 150–400)
RBC: 4.43 MIL/uL (ref 3.87–5.11)
RDW: 15.2 % (ref 11.5–15.5)
WBC: 17.7 10*3/uL — ABNORMAL HIGH (ref 4.0–10.5)
nRBC: 0 % (ref 0.0–0.2)

## 2018-05-23 LAB — GLUCOSE, CAPILLARY
Glucose-Capillary: 402 mg/dL — ABNORMAL HIGH (ref 70–99)
Glucose-Capillary: 238 mg/dL — ABNORMAL HIGH (ref 70–99)
Glucose-Capillary: 104 mg/dL — ABNORMAL HIGH (ref 70–99)
Glucose-Capillary: 276 mg/dL — ABNORMAL HIGH (ref 70–99)

## 2018-05-23 MED ORDER — PREDNISONE 50 MG PO TABS
50.0000 mg | ORAL_TABLET | Freq: Every day | ORAL | Status: DC
  Administered 2018-05-24: 50 mg via ORAL
  Filled 2018-05-23: qty 1

## 2018-05-23 MED ORDER — INSULIN ASPART 100 UNIT/ML ~~LOC~~ SOLN
5.0000 [IU] | Freq: Three times a day (TID) | SUBCUTANEOUS | Status: DC
  Administered 2018-05-23 (×2): 5 [IU] via SUBCUTANEOUS

## 2018-05-23 MED ORDER — LEVOFLOXACIN 750 MG PO TABS
750.0000 mg | ORAL_TABLET | Freq: Every day | ORAL | Status: DC
  Administered 2018-05-24: 750 mg via ORAL
  Filled 2018-05-23: qty 1

## 2018-05-23 MED ORDER — METHYLPREDNISOLONE SODIUM SUCC 40 MG IJ SOLR
40.0000 mg | Freq: Once | INTRAMUSCULAR | Status: AC
  Administered 2018-05-23: 40 mg via INTRAVENOUS
  Filled 2018-05-23: qty 1

## 2018-05-23 MED ORDER — LEVOFLOXACIN IN D5W 750 MG/150ML IV SOLN
750.0000 mg | Freq: Once | INTRAVENOUS | Status: AC
  Administered 2018-05-23: 750 mg via INTRAVENOUS
  Filled 2018-05-23: qty 150

## 2018-05-23 MED ORDER — INSULIN GLARGINE 100 UNIT/ML ~~LOC~~ SOLN
20.0000 [IU] | Freq: Every day | SUBCUTANEOUS | Status: DC
  Filled 2018-05-23: qty 0.2

## 2018-05-23 MED ORDER — INSULIN GLARGINE 100 UNIT/ML ~~LOC~~ SOLN
15.0000 [IU] | Freq: Every day | SUBCUTANEOUS | Status: DC
  Administered 2018-05-23: 15 [IU] via SUBCUTANEOUS
  Filled 2018-05-23: qty 0.15

## 2018-05-23 MED ORDER — GUAIFENESIN 100 MG/5ML PO SOLN
10.0000 mL | Freq: Four times a day (QID) | ORAL | Status: DC
  Administered 2018-05-23 – 2018-05-24 (×4): 200 mg via ORAL
  Filled 2018-05-23: qty 10
  Filled 2018-05-23: qty 5
  Filled 2018-05-23: qty 10
  Filled 2018-05-23: qty 5

## 2018-05-23 NOTE — Progress Notes (Signed)
PROGRESS NOTE    Joan [name]  YFV:[id number] DOB: [birth year] DOA: 05/20/2018 PCP: [name], MD   Brief Narrative: Patient is a 58 year old female with history of chronic respiratory failure on home oxygen, advanced COPD, bipolar disorder, hypertension, hypothyroidism who presented to the emergency department with history of 4 days of worsening shortness of breath, intermittent productive cough, dyspnea on exertion, chest pain and coughing.  She had been using her home nebulizer/inhalers without improvement.  No report of being out of the house or contact with COVID-19. Admitted  for COPD exacerbation.  Still wheezing and coughing this morning.  Assessment & Plan:   Active Problems:   Essential hypertension   Hypothyroidism due to acquired atrophy of thyroid   GERD without esophagitis   OSA on CPAP   COPD exacerbation (HCC)   Tobacco abuse   Acute on chronic respiratory failure with hypoxia (HCC)  Acute on chronic respiratory failure with hypoxia: Secondary to COPD exacerbation.  Currently respiratory status on baseline.  Continue supplemental oxygen as needed.  COPD exacerbation: Follows with pulmonology, Dr. Elsworth Soho.  Has home nebulization machine, inhalers.  Started on IV steroids.  Continue mucolytics and bronchodilators.  We will taper IV steroids to oral starting from tomorrow.  Still wheezing today.  Complains of cough and difficulty to bring up sputum out.  Hyperglycemia/steroid-induced diabetes: High CBGs.  We will taper  steroids.  Continue long-acting and short-acting insulin at present.  Not on medication for diabetes at home.  Hemoglobin A1c of 7.  Will start on metformin on discharge.  Leukocytosis: Secondary to steroids.  We will continue to monitor.  CKD stage II: Currently kidney function on baseline.  Sleep apnea: Continue CPAP  Tobacco abuse: Continues to smoke, half packs a day.  Counseled for smoking cessation  Hyperlipidemia: Continue Lipitor   Hypertension: Currently blood pressure stable.  Continue atenolol and hydrochlorothiazide  Hypothyroidism: Continue Synthyroid  Allergic rhinitis :Continue Singulair  Obesity: BMI of 32.9         DVT prophylaxis: Lovenox Code Status: Full Family Communication: None present at the bedside Disposition Plan: Home tomorrow   Consultants: None  Procedures: None  Antimicrobials:  Anti-infectives (From admission, onward)   Start     Dose/Rate Route Frequency Ordered Stop   05/24/18 1000  levofloxacin (LEVAQUIN) tablet 750 mg     750 mg Oral Daily 05/23/18 1106     05/23/18 1115  levofloxacin (LEVAQUIN) IVPB 750 mg     750 mg 100 mL/hr over 90 Minutes Intravenous  Once 05/23/18 1106     05/21/18 1000  valACYclovir (VALTREX) tablet 500 mg     500 mg Oral Daily 05/20/18 2253        Subjective:  Patient seen and examined at the bedside this morning.  He still coughing and wheezing.  Respiratory status on baseline .  Using CPAP for sleep apnea.  Bothered by productive cough.  Denies chest pain. Objective: Vitals:   05/23/18 0343 05/23/18 0732 05/23/18 0836 05/23/18 0906  BP: 128/61  128/61 (!) 129/99  Pulse: 71  71 83  Resp: (!) 22   (!) 21  Temp: 97.6 F (36.4 C)   98.2 F (36.8 C)  TempSrc: [place]   [place]  SpO2: 99% 93%  97%  Weight:      Height:        Intake/Output Summary (Last 24 hours) at 05/23/2018 1107 Last data filed at 05/23/2018 0905 Gross per 24 hour  Intake 1980 ml  Output 0  ml  Net 1980 ml   Filed Weights   05/20/18 1805 05/21/18 2105  Weight: 99.8 kg 98.3 kg    Examination:  General exam: Not in acute distress,obese  HEENT:PERRL,Oral mucosa moist, Ear/Nose normal on gross exam Respiratory system: Bilateral expiratory wheezes, decreased air entry Cardiovascular system: S1 & S2 heard, RRR. No JVD, murmurs, rubs, gallops or clicks. No pedal edema. Gastrointestinal system: Abdomen is nondistended, soft and nontender. No organomegaly or masses felt.  Normal bowel sounds heard. Central nervous system: Alert and oriented. No focal neurological deficits. Extremities: No edema, no clubbing ,no cyanosis, distal peripheral pulses palpable. Skin: No rashes, lesions or ulcers,no icterus ,no pallor MSK: Normal muscle bulk,tone ,power Psychiatry: Judgement and insight appear normal. Mood & affect appropriate.     Data Reviewed: I have personally reviewed following labs and imaging studies  CBC: Recent Labs  Lab 05/20/18 1844 05/21/18 0420 05/22/18 0300 05/23/18 0823  WBC 13.5* 14.0* 21.3* 17.7*  NEUTROABS 9.6*  --   --  15.2*  HGB 12.8 13.2 12.7 12.9  HCT 40.0 39.9 38.8 40.5  MCV 90.7 89.3 91.1 91.4  PLT 260 286 306 379   Basic Metabolic Panel: Recent Labs  Lab 05/20/18 1844 05/20/18 1959 05/21/18 0420 05/22/18 0300  NA 133*  --  132* 132*  K 2.7*  --  3.3* 4.3  CL 95*  --  91* 95*  CO2 27  --  25 25  GLUCOSE 140*  --  322* 251*  BUN 11  --  14 22*  CREATININE 1.17*  --  1.28* 1.20*  CALCIUM 9.0  --  9.1 9.4  MG  --  1.9  --   --    GFR: Estimated Creatinine Clearance: 64.2 mL/min (A) (by C-G formula based on SCr of 1.2 mg/dL (H)). Liver Function Tests: Recent Labs  Lab 05/20/18 1844  AST 16  ALT 14  ALKPHOS 127*  BILITOT 0.4  PROT 7.0  ALBUMIN 3.9   No results for input(s): LIPASE, AMYLASE in the last 168 hours. No results for input(s): AMMONIA in the last 168 hours. Coagulation Profile: No results for input(s): INR, PROTIME in the last 168 hours. Cardiac Enzymes: No results for input(s): CKTOTAL, CKMB, CKMBINDEX, TROPONINI in the last 168 hours. BNP (last 3 results) No results for input(s): PROBNP in the last 8760 hours. HbA1C: Recent Labs    05/22/18 0300  HGBA1C 7.0*   CBG: Recent Labs  Lab 05/22/18 0736 05/22/18 1246 05/22/18 1705 05/22/18 2113 05/23/18 0710  GLUCAP 341* 228* 236* 261* 402*   Lipid Profile: No results for input(s): CHOL, HDL, LDLCALC, TRIG, CHOLHDL, LDLDIRECT in the last  72 hours. Thyroid Function Tests: No results for input(s): TSH, T4TOTAL, FREET4, T3FREE, THYROIDAB in the last 72 hours. Anemia Panel: No results for input(s): VITAMINB12, FOLATE, FERRITIN, TIBC, IRON, RETICCTPCT in the last 72 hours. Sepsis Labs: No results for input(s): PROCALCITON, LATICACIDVEN in the last 168 hours.  Recent Results (from the past 240 hour(s))  MRSA PCR Screening     Status: None   Collection Time: 05/22/18 12:07 AM  Result Value Ref Range Status   MRSA by PCR NEGATIVE NEGATIVE Final    Comment:        The GeneXpert MRSA Assay (FDA approved for NASAL specimens only), is one component of a comprehensive MRSA colonization surveillance program. It is not intended to diagnose MRSA infection nor to guide or monitor treatment for MRSA infections. Performed at [facility], [place]  [address] [place], [place] [address]          Radiology Studies: No results found.      Scheduled Meds: . atenolol  25 mg Oral Daily  . atorvastatin  20 mg Oral Daily  . buPROPion  150 mg Oral Daily  . cyclobenzaprine  10 mg Oral QHS  . enoxaparin (LOVENOX) injection  40 mg Subcutaneous Daily  . fluticasone furoate-vilanterol  1 puff Inhalation Daily  . gabapentin  300 mg Oral BID  . guaiFENesin  10 mL Oral Q6H  . insulin aspart  0-20 Units Subcutaneous TID WC  . insulin aspart  0-5 Units Subcutaneous QHS  . insulin aspart  5 Units Subcutaneous TID WC  . insulin glargine  20 Units Subcutaneous QHS  . ipratropium-albuterol  3 mL Inhalation TID  . [START ON 05/24/2018] levofloxacin  750 mg Oral Daily  . levothyroxine  25 mcg Oral QAC breakfast  . montelukast  10 mg Oral QHS  . pantoprazole  40 mg Oral Daily  . roflumilast  500 mcg Oral Daily  . traZODone  100 mg Oral QHS  . valACYclovir  500 mg Oral Daily  . venlafaxine XR  150 mg Oral Q breakfast   Continuous Infusions: . levofloxacin (LEVAQUIN) IV       LOS: 2 days    Time spent: 35 mins.More than 50% of  that time was spent in counseling and/or coordination of care.      Shelly Coss, MD [facility] Pager [phone]  If 7PM-7AM, please contact night-coverage [email or web address] Password [facility] 05/23/2018, 11:07 AM

## 2018-05-23 NOTE — Progress Notes (Signed)
Dr Tawanna Solo aware of the morning blood glucose of 402

## 2018-05-24 LAB — CBC WITH DIFFERENTIAL/PLATELET
Abs Immature Granulocytes: 0.56 10*3/uL — ABNORMAL HIGH (ref 0.00–0.07)
Basophils Absolute: 0.1 10*3/uL (ref 0.0–0.1)
Basophils Relative: 0 %
Eosinophils Absolute: 0 10*3/uL (ref 0.0–0.5)
Eosinophils Relative: 0 %
HCT: 39.8 % (ref 36.0–46.0)
Hemoglobin: 12.4 g/dL (ref 12.0–15.0)
Immature Granulocytes: 3 %
Lymphocytes Relative: 17 %
Lymphs Abs: 3 10*3/uL (ref 0.7–4.0)
MCH: 28.6 pg (ref 26.0–34.0)
MCHC: 31.2 g/dL (ref 30.0–36.0)
MCV: 91.9 fL (ref 80.0–100.0)
Monocytes Absolute: 1.6 10*3/uL — ABNORMAL HIGH (ref 0.1–1.0)
Monocytes Relative: 9 %
Neutro Abs: 12.4 10*3/uL — ABNORMAL HIGH (ref 1.7–7.7)
Neutrophils Relative %: 71 %
Platelets: 308 10*3/uL (ref 150–400)
RBC: 4.33 MIL/uL (ref 3.87–5.11)
RDW: 15.4 % (ref 11.5–15.5)
WBC: 17.6 10*3/uL — ABNORMAL HIGH (ref 4.0–10.5)
nRBC: 0 % (ref 0.0–0.2)

## 2018-05-24 LAB — BASIC METABOLIC PANEL
Anion gap: 11 (ref 5–15)
BUN: 27 mg/dL — ABNORMAL HIGH (ref 6–20)
CO2: 24 mmol/L (ref 22–32)
Calcium: 9.2 mg/dL (ref 8.9–10.3)
Chloride: 105 mmol/L (ref 98–111)
Creatinine, Ser: 1.14 mg/dL — ABNORMAL HIGH (ref 0.44–1.00)
GFR calc Af Amer: >60 mL/min (ref >60)
GFR calc non Af Amer: 54 mL/min — ABNORMAL LOW (ref >60)
Glucose, Bld: 113 mg/dL — ABNORMAL HIGH (ref 70–99)
Potassium: 3.9 mmol/L (ref 3.5–5.1)
Sodium: 140 mmol/L (ref 135–145)

## 2018-05-24 LAB — GLUCOSE, CAPILLARY
Glucose-Capillary: 192 mg/dL — ABNORMAL HIGH (ref 70–99)
Glucose-Capillary: 324 mg/dL — ABNORMAL HIGH (ref 70–99)

## 2018-05-24 MED ORDER — LEVOFLOXACIN 750 MG PO TABS: 750.0000 mg | ORAL_TABLET | Freq: Every day | ORAL | 0 refills | Status: DC

## 2018-05-24 MED ORDER — HYDROCOD POLST-CPM POLST ER 10-8 MG/5ML PO SUER: 5.0000 mL | Freq: Two times a day (BID) | ORAL | 0 refills | Status: DC | PRN

## 2018-05-24 MED ORDER — PREDNISONE 10 MG PO TABS: ORAL_TABLET | ORAL | 0 refills | Status: DC

## 2018-05-24 MED ORDER — METFORMIN HCL 500 MG PO TABS: 500.0000 mg | ORAL_TABLET | Freq: Two times a day (BID) | ORAL | 0 refills | Status: DC

## 2018-05-24 MED ORDER — HYDROCOD POLST-CPM POLST ER 10-8 MG/5ML PO SUER
5.0000 mL | Freq: Once | ORAL | Status: AC
  Administered 2018-05-24: 5 mL via ORAL
  Filled 2018-05-24: qty 5

## 2018-05-24 NOTE — Discharge Summary (Signed)
Physician Discharge Summary  Joan [name] OVF:[id number] DOB: [birth year] DOA: 05/20/2018  PCP: [name], MD  Admit date: 05/20/2018 Discharge date: 05/24/2018  Admitted From: Home Disposition:  Home  Discharge Condition:Stable CODE STATUS:FULL Diet recommendation: Heart Healthy  Brief/Interim Summary: Patient is a 58 year old female with history of chronic respiratory failure on home oxygen, advanced COPD, bipolar disorder, hypertension, hypothyroidism who presented to the emergency department with history of 4 days of worsening shortness of breath, intermittent productive cough, dyspnea on exertion, chest pain and coughing.  She had been using her home nebulizer/inhalers without improvement.  No report of being out of the house or contact with COVID-19. Admitted  for COPD exacerbation.  Respiratory status has improved.  She is stable for discharge to home with steroids, cough suppressants and antibiotics.  Following problems were addressed during her hospitalization:  Acute on chronic respiratory failure with hypoxia: Secondary to COPD exacerbation.  Currently respiratory status on baseline.    COPD exacerbation: Follows with pulmonology, Dr. Elsworth Soho.  Has home nebulization machine, inhalers.  Started on IV steroids.  Continue mucolytics and bronchodilators.  We will taper IV steroids to oral .She also has home oxygen as needed.  Hyperglycemia/steroid-induced diabetes: High CBGs.   Not on medication for diabetes at home.  Hemoglobin A1c of 7.  Will start on metformin on discharge.  Leukocytosis: Secondary to steroids.  Check CBC in a week.  CKD stage II: Currently kidney function on baseline.  Sleep apnea: Continue CPAP  Tobacco abuse: Continues to smoke, half packs a day.  Counseled for smoking cessation  Hyperlipidemia: Continue Lipitor  Hypertension: Currently blood pressure stable.  Continue atenolol and hydrochlorothiazide  Hypothyroidism: Continue  Synthyroid  Allergic rhinitis :Continue Singulair  Obesity: BMI of 32.9    Discharge Diagnoses:  Active Problems:   Essential hypertension   Hypothyroidism due to acquired atrophy of thyroid   GERD without esophagitis   OSA on CPAP   COPD exacerbation (HCC)   Tobacco abuse   Acute on chronic respiratory failure with hypoxia [facility])    Discharge Instructions  Discharge Instructions    Diet - low sodium heart healthy   Complete by:  As directed    Discharge instructions   Complete by:  As directed    1)Please take prescribed medications as instructed. 2)Follow up with your PCP in a week. Do a CBC and BMP test during the follow up.Do a HbA1c test in 3 months. 3)Please stop smoking. 4)Follow up with your pulmonologist as an outpatient. 5)Dont go outside your home.Keep social distancing.   Increase activity slowly   Complete by:  As directed      Allergies as of 05/24/2018      Reactions   Keflex [cephalexin] Other (See Comments)   unspecified   Prednisone Other (See Comments)   "counter reacts"   Shellfish Allergy Other (See Comments)   Stomach cramps   Tetracyclines & Related Other (See Comments)   unspecified   Dilaudid [hydromorphone Hcl] Other (See Comments)   "Lethargy"   Incruse Ellipta [umeclidinium Bromide] Nausea Only   Tuberculin Tests Other (See Comments)      Medication List    STOP taking these medications   gabapentin 300 MG capsule Commonly known as:  NEURONTIN   Spiriva Respimat 2.5 MCG/ACT Aers Generic drug:  Tiotropium Bromide Monohydrate     TAKE these medications   albuterol (2.5 MG/3ML) 0.083% nebulizer solution Commonly known as:  PROVENTIL Take 3 mLs (2.5 mg total) by nebulization 2 (two)  times daily. And every 6 hours as needed.  DX: J44.9   atenolol 25 MG tablet Commonly known as:  TENORMIN Take 1 tablet (25 mg total) by mouth daily.   atorvastatin 20 MG tablet Commonly known as:  LIPITOR Take 1 tablet (20 mg total) by  mouth daily.   baclofen 10 MG tablet Commonly known as:  LIORESAL Take 10 mg by mouth at bedtime.   betamethasone valerate ointment 0.1 % Commonly known as:  VALISONE USE 1 APPLICATION TOPICALLYTWO TIMES A DAY   buPROPion 150 MG 24 hr tablet Commonly known as:  WELLBUTRIN XL Take 1 tablet (150 mg total) by mouth daily.   chlorpheniramine-HYDROcodone 10-8 MG/5ML Suer Commonly known as:  Tussionex Pennkinetic ER Take 5 mLs by mouth every 12 (twelve) hours as needed for cough.   cyclobenzaprine 10 MG tablet Commonly known as:  FLEXERIL Take 1 tablet (10 mg total) by mouth at bedtime.   diphenhydrAMINE 25 MG tablet Commonly known as:  BENADRYL Take 25 mg by mouth 2 (two) times daily.   gentamicin cream 0.1 % Commonly known as:  GARAMYCIN Apply 1 application topically 2 (two) times daily.   hydrochlorothiazide 25 MG tablet Commonly known as:  HYDRODIURIL Take 1 tablet (25 mg total) by mouth daily.   Ipratropium-Albuterol 20-100 MCG/ACT Aers respimat Commonly known as:  Combivent Respimat Inhale 1 puff into the lungs every 6 (six) hours as needed for wheezing. What changed:  when to take this   levofloxacin 750 MG tablet Commonly known as:  LEVAQUIN Take 1 tablet (750 mg total) by mouth daily.   levothyroxine 25 MCG tablet Commonly known as:  SYNTHROID, LEVOTHROID Take 1 tablet (25 mcg total) by mouth daily before breakfast.   metFORMIN 500 MG tablet Commonly known as:  Glucophage Take 1 tablet (500 mg total) by mouth 2 (two) times daily with a meal for 30 days.   montelukast 10 MG tablet Commonly known as:  SINGULAIR Take 1 tablet (10 mg total) by mouth at bedtime.   naproxen 500 MG tablet Commonly known as:  NAPROSYN Take 1 tablet (500 mg total) by mouth 2 (two) times daily with a meal. What changed:    when to take this  reasons to take this   nystatin-triamcinolone ointment Commonly known as:  MYCOLOG Apply 1 application topically 2 (two) times daily as  needed (applied to skin folds/groins/under breast skin irritation rash.).   OXYGEN Inhale 2 L into the lungs daily as needed (strenous activity).   pantoprazole 40 MG tablet Commonly known as:  PROTONIX Take 1 tablet (40 mg total) by mouth daily.   predniSONE 10 MG tablet Commonly known as:  DELTASONE Take 4 pills daily for 3 days then 3 pills daily for 3 days then 2 pills daily for 3 days then 1 pill daily for 3 days then stop   roflumilast 500 MCG Tabs tablet Commonly known as:  Daliresp Take 1 tablet (500 mcg total) by mouth daily.   Symbicort 160-4.5 MCG/ACT inhaler Generic drug:  budesonide-formoterol Inhale 2 puffs into the lungs 2 (two) times daily.   traZODone 100 MG tablet Commonly known as:  DESYREL Take 1 tablet (100 mg total) by mouth at bedtime.   valACYclovir 500 MG tablet Commonly known as:  VALTREX Take 1 tablet (500 mg total) by mouth daily.   venlafaxine XR 150 MG 24 hr capsule Commonly known as:  EFFEXOR-XR TAKE 1 CAPSULE DAILY WITH  BREAKFAST What changed:    how much to take  how to take this  when to take this  additional instructions      Follow-up Information    [name], MD. Schedule an appointment as soon as possible for a visit in 1 week(s).   Specialty:  Internal Medicine Contact information: [place] [id number] [phone]          Allergies  Allergen Reactions  . Keflex [Cephalexin] Other (See Comments)    unspecified  . Prednisone Other (See Comments)    "counter reacts"  . Shellfish Allergy Other (See Comments)    Stomach cramps  . Tetracyclines & Related Other (See Comments)    unspecified  . Dilaudid [Hydromorphone Hcl] Other (See Comments)    "Lethargy"  . Incruse Ellipta [Umeclidinium Bromide] Nausea Only  . Tuberculin Tests Other (See Comments)    Consultations:  None   Procedures/Studies: Dg Chest Portable 1 View  Result Date: 05/20/2018 CLINICAL DATA:  Shortness of  breath. EXAM: PORTABLE CHEST 1 VIEW COMPARISON:  Chest x-ray dated December 28, 2017. FINDINGS: The heart size and mediastinal contours are within normal limits. Normal pulmonary vascularity. The lungs remain mildly hyperinflated with emphysematous changes and coarsened interstitial markings at the lung bases. No focal consolidation, pleural effusion, or pneumothorax. No acute osseous abnormality. IMPRESSION: 1. No active disease. 2. COPD. Electronically Signed   By: Titus Dubin M.D.   On: 05/20/2018 18:49       Subjective: Patient seen and examined the bedside this morning.  Hemodynamically stable today.  Blood sugars improved.  Stable for discharge to home with oral steroids.  Currently saturating fine on room air.  Bothered by cough  Discharge Exam: Vitals:   05/24/18 0556 05/24/18 0759  BP: (!) 159/101   Pulse: 79   Resp: (!) 22   Temp: 98.3 F (36.8 C)   SpO2: 97% 96%   Vitals:   05/23/18 1357 05/23/18 1754 05/24/18 0556 05/24/18 0759  BP:  113/71 (!) 159/101   Pulse:  77 79   Resp:  (!) 21 (!) 22   Temp:  97.6 F (36.4 C) 98.3 F (36.8 C)   TempSrc:  Oral Oral   SpO2: 97% 95% 97% 96%  Weight:      Height:        General: Pt is alert, awake, not in acute distress Cardiovascular: RRR, S1/S2 +, no rubs, no gallops Respiratory: Decreased air entry bilaterally, mild expiratory wheezes Abdominal: Soft, NT, ND, bowel sounds + Extremities: no edema, no cyanosis    The results of significant diagnostics from this hospitalization (including imaging, microbiology, ancillary and laboratory) are listed below for reference.     Microbiology: Recent Results (from the past 240 hour(s))  MRSA PCR Screening     Status: None   Collection Time: 05/22/18 12:07 AM  Result Value Ref Range Status   MRSA by PCR NEGATIVE NEGATIVE Final    Comment:        The GeneXpert MRSA Assay (FDA approved for NASAL specimens only), is one component of a comprehensive MRSA  colonization surveillance program. It is not intended to diagnose MRSA infection nor to guide or monitor treatment for MRSA infections. Performed at [facility], [place] [address], [place], [place] [address]      Labs: BNP (last 3 results) Recent Labs    12/29/17 0609  BNP 048.8*   Basic Metabolic Panel: Recent Labs  Lab 05/20/18 1844 05/20/18 1959 05/21/18 0420 05/22/18 0300 05/24/18 0550  NA 133*  --  132* 132* 140  K 2.7*  --  3.3* 4.3 3.9  CL 95*  --  91* 95* 105  CO2 27  --  25 25 24   GLUCOSE 140*  --  322* 251* 113*  BUN 11  --  14 22* 27*  CREATININE 1.17*  --  1.28* 1.20* 1.14*  CALCIUM 9.0  --  9.1 9.4 9.2  MG  --  1.9  --   --   --    Liver Function Tests: Recent Labs  Lab 05/20/18 1844  AST 16  ALT 14  ALKPHOS 127*  BILITOT 0.4  PROT 7.0  ALBUMIN 3.9   No results for input(s): LIPASE, AMYLASE in the last 168 hours. No results for input(s): AMMONIA in the last 168 hours. CBC: Recent Labs  Lab 05/20/18 1844 05/21/18 0420 05/22/18 0300 05/23/18 0823 05/24/18 0550  WBC 13.5* 14.0* 21.3* 17.7* 17.6*  NEUTROABS 9.6*  --   --  15.2* 12.4*  HGB 12.8 13.2 12.7 12.9 12.4  HCT 40.0 39.9 38.8 40.5 39.8  MCV 90.7 89.3 91.1 91.4 91.9  PLT 260 286 306 328 308   Cardiac Enzymes: No results for input(s): CKTOTAL, CKMB, CKMBINDEX, TROPONINI in the last 168 hours. BNP: Invalid input(s): POCBNP CBG: Recent Labs  Lab 05/23/18 1138 05/23/18 1643 05/23/18 2232 05/23/18 2355 05/24/18 0724  GLUCAP 238* 104* 276* 324* 192*   D-Dimer No results for input(s): DDIMER in the last 72 hours. Hgb A1c Recent Labs    05/22/18 0300  HGBA1C 7.0*   Lipid Profile No results for input(s): CHOL, HDL, LDLCALC, TRIG, CHOLHDL, LDLDIRECT in the last 72 hours. Thyroid function studies No results for input(s): TSH, T4TOTAL, T3FREE, THYROIDAB in the last 72 hours.  Invalid input(s): FREET3 Anemia work up No results for input(s): VITAMINB12, FOLATE, FERRITIN,  TIBC, IRON, RETICCTPCT in the last 72 hours. Urinalysis    Component Value Date/Time   COLORURINE YELLOW 08/05/2017 1652   APPEARANCEUR CLEAR 08/05/2017 1652   LABSPEC 1.025 08/05/2017 1652   PHURINE 5.0 08/05/2017 1652   GLUCOSEU NEGATIVE 08/05/2017 1652   HGBUR TRACE-INTACT (A) 08/05/2017 1652   BILIRUBINUR NEGATIVE 08/05/2017 1652   KETONESUR NEGATIVE 08/05/2017 1652   PROTEINUR NEGATIVE 06/02/2017 2258   UROBILINOGEN 0.2 08/05/2017 1652   NITRITE NEGATIVE 08/05/2017 1652   LEUKOCYTESUR NEGATIVE 08/05/2017 1652   Sepsis Labs Invalid input(s): PROCALCITONIN,  WBC,  LACTICIDVEN Microbiology Recent Results (from the past 240 hour(s))  MRSA PCR Screening     Status: None   Collection Time: 05/22/18 12:07 AM  Result Value Ref Range Status   MRSA by PCR NEGATIVE NEGATIVE Final    Comment:        The GeneXpert MRSA Assay (FDA approved for NASAL specimens only), is one component of a comprehensive MRSA colonization surveillance program. It is not intended to diagnose MRSA infection nor to guide or monitor treatment for MRSA infections. Performed at [facility], [place] [address] [place], [place] [address]     Please note: You were cared for by a hospitalist during your hospital stay. Once you are discharged, your primary care physician will handle any further medical issues. Please note that NO REFILLS for any discharge medications will be authorized once you are discharged, as it is imperative that you return to your primary care physician (or establish a relationship with a primary care physician if you do not have one) for your post hospital discharge needs so that they can reassess your need for medications  and monitor your lab values.    Time coordinating discharge: 40 minutes  SIGNED:   Shelly Coss, MD  [facility] 05/24/2018, 8:53 AM Pager [phone]  If 7PM-7AM, please contact night-coverage [email or web address] Password [email or web address]

## 2018-05-25 ENCOUNTER — Telehealth: Payer: Self-pay | Admitting: *Deleted

## 2018-05-25 NOTE — Telephone Encounter (Signed)
Should be filled out by pulmonary and they can fill it out.

## 2018-05-25 NOTE — Telephone Encounter (Signed)
I have faxed the paperwork to patient pulmonary doctor

## 2018-05-25 NOTE — Telephone Encounter (Signed)
Called pt to make virtual hosp f/u appt. Pt was discharge 05/24/18 and was told to follow-up w/pcp in 1 week. There was no answer LMOM RTC...Joan [name]

## 2018-05-25 NOTE — Telephone Encounter (Signed)
I have pulled the paper work from upfront. Patient states that is has to be filled out by PCP

## 2018-05-25 NOTE — Telephone Encounter (Signed)
Patient returning call to [facility], she states that she is open to webex. The email address on chart is correct.

## 2018-05-26 NOTE — Telephone Encounter (Signed)
Pt has made an virtual appt for 06/01/18. She states that her insurance will not cover the Lyrica, but it will cover Gabapentin. Pt states while in the hospital they was giving her Gabapentin 300 mg twice a day, and she would [facility]. Also she has thrush in her mouth and need rx for magic mouth wash, or something to get rid of thrush...Johny Chess

## 2018-05-26 NOTE — Telephone Encounter (Signed)
Called pt no answer lmom rtc.Marland Kitchen/LMB

## 2018-05-26 NOTE — Telephone Encounter (Signed)
Pt called back because she forgot to tell Lorre Nick about the info below:  Pt states she was started on metformin and advised to monitor her blood sugar. Pt needs rx sent to pharmacy for diabetic meter and supplies.   [id number] - [place], [place] - [place]  [address] [place] [address]  Phone: [phone] Fax: [phone]  Open 24 hours

## 2018-05-26 NOTE — Telephone Encounter (Signed)
Pt return call back would like to make virtual appt for hosp f/u. Completed TCM call below.Joan [name]  Transition Care Management Follow-up Telephone Call   Date discharged? 05/24/18   How have you been since you were released from the hospital? Pt states she is doing ok   Do you understand why you were in the hospital? YES   Do you understand the discharge instructions? YES   Where were you discharged to? Home   Items Reviewed:  Medications reviewed: YES, but pt states her insurance will no longer cover the generic Lyrica, but would cover the Gabapentin 300 mg twice a day. They was giving to to her while she was in they hospital and would like MD to [facility]. Inform pt will send MD a msg w/her issue  Allergies reviewed: YES  Dietary changes reviewed: YES, heart healthy  Referrals reviewed: YES   Functional Questionnaire:   Activities of Daily Living (ADLs):   She states she are independent in the following: ambulation, bathing and hygiene, feeding, continence, grooming, toileting and dressing States she doesn't require assistance    Any transportation issues/concerns?: NO   Any patient concerns? YES, pt states she has thrush on her tongue would like rx for some magic mouth wash [facility]   Confirmed importance and date/time of follow-up visits scheduled YES, virtual appt 06/01/18  Provider Appointment booked with Dr. Sharlet Salina  Confirmed with patient if condition begins to worsen call PCP or go to the ER.  Patient was given the office number and encouraged to call back with question or concerns.  : YES

## 2018-05-27 ENCOUNTER — Encounter: Payer: Self-pay | Admitting: Internal Medicine

## 2018-05-27 MED ORDER — GLUCOSE BLOOD VI STRP: ORAL_STRIP | 12 refills | Status: DC

## 2018-05-27 MED ORDER — ONETOUCH ULTRASOFT LANCETS MISC: 12 refills | Status: DC

## 2018-05-27 MED ORDER — GABAPENTIN 300 MG PO CAPS: 300.0000 mg | ORAL_CAPSULE | Freq: Two times a day (BID) | ORAL | 0 refills | Status: DC

## 2018-05-27 MED ORDER — ONETOUCH ULTRA 2 W/DEVICE KIT: PACK | 0 refills | Status: DC

## 2018-05-27 NOTE — Telephone Encounter (Signed)
Sent in gabapentin to take 1 pill twice a day (300 mg). She does not need to monitor sugars at this time so does not need meter or supplies. Can discuss at visit.

## 2018-05-27 NOTE — Telephone Encounter (Signed)
Called pt no answer LMOM w/MD response../lmb 

## 2018-05-28 ENCOUNTER — Ambulatory Visit: Payer: Medicare Other | Admitting: Physical Therapy

## 2018-05-28 ENCOUNTER — Encounter: Payer: Self-pay | Admitting: Internal Medicine

## 2018-05-28 ENCOUNTER — Telehealth: Payer: Self-pay | Admitting: Pulmonary Disease

## 2018-05-28 ENCOUNTER — Telehealth: Payer: Self-pay

## 2018-05-28 MED ORDER — NYSTATIN 100000 UNIT/ML MT SUSP: 5.0000 mL | Freq: Four times a day (QID) | OROMUCOSAL | 0 refills | Status: DC

## 2018-05-28 NOTE — Telephone Encounter (Signed)
Pt phone number rang busy x3.  I saw Dr Sharlet Salina sent in nystatin today 05/28/18.  Will try again in a few minutes.  Will leave in triage to follow up.

## 2018-05-28 NOTE — Telephone Encounter (Signed)
Copied from [place] [phone]. Topic: General - Other >> May 28, 2018  2:36 PM Joan [name] wrote: Pt said she has thrush and is asking if something can be called in for her ,would like a call back , she has a hosp fu scheduled for 4.6.2020

## 2018-05-28 NOTE — Telephone Encounter (Signed)
This has been sent in for patient and Dr. Sharlet Salina has responded to patient through [facility]

## 2018-05-29 ENCOUNTER — Ambulatory Visit (INDEPENDENT_AMBULATORY_CARE_PROVIDER_SITE_OTHER): Payer: Medicare Other | Admitting: Pulmonary Disease

## 2018-05-29 ENCOUNTER — Telehealth: Payer: Self-pay | Admitting: Pulmonary Disease

## 2018-05-29 ENCOUNTER — Other Ambulatory Visit: Payer: Self-pay

## 2018-05-29 DIAGNOSIS — J449 Chronic obstructive pulmonary disease, unspecified: Secondary | ICD-10-CM

## 2018-05-29 NOTE — Telephone Encounter (Signed)
Pt phone number rang busy x3.

## 2018-05-29 NOTE — Progress Notes (Signed)
Virtual Visit via Telephone Note  I connected with Joan [name] on 05/29/18 at 11:00 AM EDT by telephone and verified that I am speaking with the correct person using two identifiers.   I discussed the limitations, risks, security and privacy concerns of performing an evaluation and management service by telephone and the availability of in person appointments. I also discussed with the patient that there may be a patient responsible charge related to this service. The patient expressed understanding and agreed to proceed.  05/29/2018 - 1109 - Attempted x2 line busy  05/29/2018 - 1115 - Attempted line busy  05/29/2018 - 1128 - Attempted line busy   History of Present Illness: 58 y.o. female current every day smoker  with COPD, steroid induced DM and OSA. She is followed by Dr. Elsworth Soho.  Patient consented to consult via telephone: People present and their role in pt care:  Chief complaint:   Unable to complete telephonic out reach today as patient did not answer the phone.  See attempts listed above   Observations/Objective:  NPSG 02/2017 >> no OSA HST 04/2017 15/h  10/2016 PFTs showed ratio 49, FEV1 of 36%, FVC 58% and DLCO of 34% consistent with severe obstruction.  11/2016 CT chestbullous changes BL, 33mmRUl & 2 mm LULnodules >> stable 11/2017  09/2016 echo normal.  No results found for: NITRICOXIDE    Assessment and Plan:  No problem-specific Assessment & Plan notes found for this encounter.   Follow Up Instructions:  Patient needs follow-up with our office.   I discussed the assessment and treatment plan with the patient. The patient was provided an opportunity to ask questions and all were answered. The patient agreed with the plan and demonstrated an understanding of the instructions.   The patient was advised to call back or seek an in-person evaluation if the symptoms worsen or if the condition fails to improve as anticipated.  I provided 0 minutes of  non-face-to-face time during this encounter.   [name], NP

## 2018-05-29 NOTE — Patient Instructions (Signed)
We have attempted to reach you 4 times for your telephonic out reach on 05/29/2018.  The line is busy.  Please contact our office to reschedule.  Wyn Quaker, FNP

## 2018-05-29 NOTE — Telephone Encounter (Signed)
05/29/2018 1131  I have attempted to reach this patient multiple times today.  4 attempts were made.  Line has been busy the entire time.  Patient has missed her telephonic office visit with me today.  Please contact the patient and get her scheduled for a telephonic outrage next week.  Please also remind the patient that she will need to remain off of the phone when we are trying to attempt to reach her during that office time.  Also please confirm contact information with the patient.  Wyn Quaker FNP

## 2018-05-29 NOTE — Telephone Encounter (Signed)
I have reached out to the patient via mychart to have her call our office. I believe her phone has blocked our calls due to the name that is associated with our number will try to reach back out.

## 2018-06-01 ENCOUNTER — Other Ambulatory Visit: Payer: Self-pay

## 2018-06-01 ENCOUNTER — Ambulatory Visit (INDEPENDENT_AMBULATORY_CARE_PROVIDER_SITE_OTHER): Payer: Medicare Other | Admitting: Acute Care

## 2018-06-01 ENCOUNTER — Encounter: Payer: Self-pay | Admitting: Acute Care

## 2018-06-01 ENCOUNTER — Ambulatory Visit: Payer: Medicare Other | Admitting: Acute Care

## 2018-06-01 ENCOUNTER — Ambulatory Visit (INDEPENDENT_AMBULATORY_CARE_PROVIDER_SITE_OTHER): Payer: Medicare Other | Admitting: Internal Medicine

## 2018-06-01 ENCOUNTER — Telehealth: Payer: Self-pay | Admitting: Internal Medicine

## 2018-06-01 ENCOUNTER — Encounter: Payer: Self-pay | Admitting: Internal Medicine

## 2018-06-01 DIAGNOSIS — J441 Chronic obstructive pulmonary disease with (acute) exacerbation: Secondary | ICD-10-CM | POA: Diagnosis not present

## 2018-06-01 DIAGNOSIS — E099 Drug or chemical induced diabetes mellitus without complications: Secondary | ICD-10-CM | POA: Diagnosis not present

## 2018-06-01 DIAGNOSIS — J9611 Chronic respiratory failure with hypoxia: Secondary | ICD-10-CM

## 2018-06-01 DIAGNOSIS — T380X5A Adverse effect of glucocorticoids and synthetic analogues, initial encounter: Secondary | ICD-10-CM | POA: Diagnosis not present

## 2018-06-01 MED ORDER — BUPROPION HCL ER (XL) 150 MG PO TB24: 150.0000 mg | ORAL_TABLET | Freq: Every day | ORAL | 2 refills | Status: DC

## 2018-06-01 MED ORDER — ATENOLOL 25 MG PO TABS: 25.0000 mg | ORAL_TABLET | Freq: Every day | ORAL | 3 refills | Status: DC

## 2018-06-01 MED ORDER — ACYCLOVIR 400 MG PO TABS: 400.0000 mg | ORAL_TABLET | Freq: Two times a day (BID) | ORAL | 3 refills | Status: DC

## 2018-06-01 MED ORDER — PANTOPRAZOLE SODIUM 40 MG PO TBEC: 40.0000 mg | DELAYED_RELEASE_TABLET | Freq: Every day | ORAL | 3 refills | Status: DC

## 2018-06-01 MED ORDER — HYDROCHLOROTHIAZIDE 25 MG PO TABS: 25.0000 mg | ORAL_TABLET | Freq: Every day | ORAL | 3 refills | Status: DC

## 2018-06-01 MED ORDER — LEVOTHYROXINE SODIUM 25 MCG PO TABS: 25.0000 ug | ORAL_TABLET | Freq: Every day | ORAL | 3 refills | Status: DC

## 2018-06-01 MED ORDER — GABAPENTIN 300 MG PO CAPS: 300.0000 mg | ORAL_CAPSULE | Freq: Two times a day (BID) | ORAL | 1 refills | Status: DC

## 2018-06-01 MED ORDER — ATORVASTATIN CALCIUM 20 MG PO TABS: 20.0000 mg | ORAL_TABLET | Freq: Every day | ORAL | 3 refills | Status: DC

## 2018-06-01 NOTE — Telephone Encounter (Signed)
Pt phone number rang busy x3.

## 2018-06-01 NOTE — Telephone Encounter (Signed)
Pt completed televisit with SG today. Nothing further needed.   Aaron Edelman

## 2018-06-01 NOTE — Assessment & Plan Note (Signed)
Having problems obtaining her medications and working with pulmonary for financial assistance. She has had several recent hospitalizations and has severe COPD and uses oxygen. She would strongly benefit from regular usage of her medications.

## 2018-06-01 NOTE — Assessment & Plan Note (Signed)
Finished levaquin and is finishing the steroid taper. She will finish as planned and work with pulmonary to get her medications.

## 2018-06-01 NOTE — Progress Notes (Signed)
Virtual Visit via Telephone Note  I connected with Joan [name] on 06/01/18 at 11:00 AM EDT by telephone and verified that I am speaking with the correct person using two identifiers.   I discussed the limitations, risks, security and privacy concerns of performing an evaluation and management service by telephone and the availability of in person appointments. I also discussed with the patient that there may be a patient responsible charge related to this service. The patient expressed understanding and agreed to proceed.  I  confirmed date of birth and address to authenticate patient  identity. My nurse Quentin Ore reviewed medications and ordered any refills required.  Synopsis:  Joan [name] is a 58 y.o. female current every day smoker  with COPD, steroid induced DM and OSA, bipolar disorder, hypertension, hypothyroidism . She is followed by Dr. Elsworth Soho for CPAP therapy and COPD.She has been hospitalized 12/2017 and 04/2018 with COPD exacerbations.  Maintenance : Spiriva Respimat/ Symbicort Combivent Respimat Albuterol nebs prn  History of Present Illness: Pt. Was scheduled for a hospital follow up tele visit 4/3 2020 with Wyn Quaker NP. She was called 3 times and did not answer the phone. The patient called the office this morning requesting  a tele visit. She was admitted to the hospital 3/25-3/29/2020. She was admitted for history of 4 days of worsening shortness of breath,intermittent productive cough, dyspnea on exertion, chest pain and coughing. She had been using her home nebulizer/inhalers without improvement. No report of being out of the house or contact with COVID-19. She was admittedfor COPD exacerbation.  Respiratory status improved.  She had reached maximal inpatient  benefit and was  stable for discharge to home with prednisone taper, cough suppressants ( Tussionex) and Levaquin. She was asked to resume her Spiriva Respimat/ Symbicort, her Combivent, and her Albuterol nebs.  She was asked to continue her Singulair and Daliresp daily. She wears oxygen at 2 L [place] as needed to maintain oxygen saturations 88092%. She was asked to follow up with her PCP Pricilla Holm 1 week after discharge for labs.  Of note HGB A1C was 7, and Metformin treatment was initiated,  06/01/2018 06/01/2018 10:57:Attempted to call patient, line busy, no answer 06/01/2018 11:00: Attempted to call , line busy,no answer 06/01/2018 11:05: Attempted to call, line busy,no answer 06/01/2018 11:15 Attempted to call, line busy, no answer 06/01/2018 11:22 Attempted to call , line busy, no answer   Observations/Objective: NPSG 02/2017 >> no OSA HST 04/2017 15/h  10/2016 PFTs showed ratio 49, FEV1 of 36%, FVC 58% and DLCO of 34% consistent with severe obstruction.  11/2016 CT chestbullous changes BL, 59mmRUl & 2 mm LULnodules >> stable 11/2017  09/2016 echo normal.   Assessment and Plan: Unable to complete telephonic out reach today as patient did not answer the phone.  See attempts listed above  No problem-specific Assessment & Plan notes found for this encounter as the patient did not answer the phone  Follow Up Instructions: Pt. Needs follow up in the office. She has missed 2 tele visit appointments as her phone was busy and she did not answer.   I discussed the assessment and treatment plan with the patient. The patient was provided an opportunity to ask questions and all were answered. The patient agreed with the plan and demonstrated an understanding of the instructions.   The patient was advised to call back or seek an in-person evaluation if the symptoms worsen or if the condition fails to improve as anticipated.  I  provided 0 minutes of non-face-to-face time during this encounter.   [name], NP 06/01/2018 11:20 AM

## 2018-06-01 NOTE — Assessment & Plan Note (Signed)
She is taking metformin at this time. Sugars running close to 200 now. She will take metformin 500 mg BID until off prednisone and then continue another week. She has access to monitor for sugars and will check morning sugars and let us know the readings.

## 2018-06-01 NOTE — Patient Instructions (Addendum)
It was good to speak to you today.   COPD, severe ([place]) For Your COPD We will send you paperwork for financial assistance with your medications Fill these out and return them to the drug manufacturer. They will let you know if you qualify for financial assistance. We will send in a prescription for your albuterol nebs and your Singulair. ( 4 refills)  This will get you through one year. Continue your Singulair 10 mg daily Continue your Albuterol nebs as needed  Continue your Symbicort 2 puff twice daily Rinse mouth after use Spiriva 2 puffs every morning. Continue Daliresp one  500 mcg tablet daily Add Mucinex 1200 mg once to twice daily with a full glass of water to thin secretions. Note your daily symptoms >remember "red flags" for COPD: Increase in cough, increase in sputum production, increase in shortness of breath or activity intolerance. If you notice these symptoms, please call to be seen.  Follow up with Galadriel Shroff NP 08/24/2018 at 2 pm We will schedule you to see Dr. Elsworth Soho once he is back in the office. Please contact office for sooner follow up if symptoms do not improve or worsen or seek emergency care      OSA on CPAP Good Compliance AHI is 1.7  For your OSA Continue on CPAP at bedtime. You appear to be benefiting from the treatment Goal is to wear for at least 6 hours each night for maximal clinical benefit. Continue to work on weight loss, as the link between excess weight  and sleep apnea is well established.  Do not drive if sleepy. Remember to clean mask, tubing, filter, and reservoir once weekly with soapy water.    Pulmonary nodule Recently started smoking again and then quit 02/28/2018 Plan: Will need follow up CT chest without contrast 11/2018  Tobacco abuse Continues to smoke 1/2 PPD It is important that you do not smoke at all. Let us know if there is anything we can do to help you quit smoking I have spent 4 minutes counseling patient on smoking  cessation this visit. Patient verbalizes understanding of the importance to remain  Smoke free due to the  the negative health consequences including worsening of COPD, risk of lung cancer , stroke and heart disease.  With your underlying pulmonary disease, you are at a higher risk for poor outcome if you contract Covid 19. Continue to self isolate, wash hand frequently, and wear a face mask if you leave your home. Remember to utilize the early hours at grocery stores/ drug stores  for people > 19 years old or any underlying health risks, as the stores are cleaner and less crowded at these times.

## 2018-06-01 NOTE — Progress Notes (Signed)
Virtual Visit via Video Note  I connected with Joan [name] on 06/01/18 at  3:20 PM EDT by a video enabled telemedicine application and verified that I am speaking with the correct person using two identifiers.   I discussed the limitations of evaluation and management by telemedicine and the availability of in person appointments. The patient expressed understanding and agreed to proceed.  History of Present Illness: The patient is a 58 y.o. YO female with visit for hospital follow up (in for COPD exacerbation and treated with levaquin and steroids, required oxygen, did not require intubation). Due to the coronavirus outbreak she was discharged and not feeling well since they were trying to get her home soon. Started about 3 weeks ago. She has finally started feeling better in the last 2 days or so. She has finished levaquin and still has about 2-3 days of prednisone taper left. She is having problems affording her breathing medications. Has talked to her pulmonary provider earlier today and they are helping her with patient assistance. Has cough and SOB still. Using oxygen less at home. Denies fevers or chills or body aches. Overall it is improving gradually. Has tried her prescription medications and has albuterol. Denies new chest pains or constipation or diarrhea. Denies new stomach pain or joint pains. Is not sleeping well.   PMH, [place], social history reviewed and updated  Observations/Objective: Appearance: chronically ill, breathing appears normal with no SOB with talking, some walking around during visit without noticeable SOB although very short distance, casual grooming, abdomen does not appear distended, throat normal, mental status is A and O times 3.  Assessment and Plan: See problem oriented charting  Follow Up Instructions: Work with pulmonary to obtain her medications, continue taking prednisone until gone, continue metformin for 1 week after stopping prednisone and then stop and  monitor sugars daily fasting and report on values  I discussed the assessment and treatment plan with the patient. The patient was provided an opportunity to ask questions and all were answered. The patient agreed with the plan and demonstrated an understanding of the instructions.   The patient was advised to call back or seek an in-person evaluation if the symptoms worsen or if the condition fails to improve as anticipated.  [name], MD

## 2018-06-01 NOTE — Progress Notes (Signed)
Virtual Visit via Telephone Note  I connected with Joan [name] on 06/01/18 at  2:00 PM EDT by telephone and verified that I am speaking with the correct person using two identifiers.   I discussed the limitations, risks, security and privacy concerns of performing an evaluation and management service by telephone and the availability of in person appointments. I also discussed with the patient that there may be a patient responsible charge related to this service. The patient expressed understanding and agreed to proceed.  I  confirmed date of birth and address to authenticate patient  identity. My nurse Joan [name] reviewed medications and ordered any refills required.  Synopsis:  Joan [name] a 58 y.o.femalecurrent every day smokerwith COPD, steroid induced DMand OSA, bipolar disorder, hypertension, hypothyroidism . She is followed by Joan [name] for CPAP therapy and COPD.She has been hospitalized 12/2017 and 04/2018 with COPD exacerbations.  Maintenance : Spiriva Respimat/ Symbicort Combivent Respimat Albuterol nebs prn  History of Present Illness: Pt. Was scheduled for a hospital follow up tele visit 4/3 2020 with Joan [name]. She was called 3 times and did not answer the phone. The patient called the office this morning requesting  a tele visit. She was scheduled for 11 am. We called 5 times the phone was busy each time and  there was no answer. Please see the earlier note for specifics of call. Pt. Then called again, and stated no-one had called her . We explained that we had called her several times and that the phone was busy. We have scheduled her for 2 pm and we have requested that she call us, as we have had trouble connecting with her by phone.Marland Kitchen   She was admitted to the hospital 3/25-3/29/2020. She was admitted for history of 4 days of worsening shortness of breath,intermittent productive cough, dyspnea on exertion, chest pain and coughing. She had been using her home  nebulizer/inhalers without improvement. No report of being out of the house or contact with COVID-19. She was admittedfor COPD exacerbation.Respiratory status improved. She had reached maximal inpatient  benefit and was  stable for discharge to home with prednisone taper,cough suppressants ( Tussionex) and Levaquin. She was asked to resume her Spiriva Respimat/ Symbicort, her Combivent, and her Albuterol nebs. She was asked to continue her Singulair and Daliresp daily. She wears oxygen at 2 L [place] as needed to maintain oxygen saturations 88-92%. She was asked to follow up with her PCP Joan [name] 1 week after discharge for labs.  Of note HGB A1C was 7, and Metformin treatment was initiated,  She is calling today stating that she cannot afford any of her medications. She cannot afford  Daliresp, Symbicort, or Spiriva  Singulair of Combivent.She states she also cannot manage the Combivent. She states she can afford the Albuterol and she needs a 3 month prescription sent in.She is requesting financial assistance. We are currently looking into financial programs that she may be able to take advantage of. She has got extra of Combivent , Symbicort, albuterol nebs  and ventolin inhaler . She has a small amount or Daliresp left. I have told her to call the office if she gets into an emergency situation and we can evaluate med samples at that time.  She states she has been doing much better since returning from the hospital. She finished her Levaquin and she has prednisone taper ends Friday. She said initially she was wearing her oxygen day and . Now she just needs to use  it with exertion. She states she is not monitoring her oxygen saturations. She uses it when she feels short of breath. Secretions have been clear to white. She states she is not wheezing. She is doing a neb treatment once or twice daily. She denies any fever, chest pain, orthopnea . She has had some hot flashed which she states is her  baseline. She states she had  thrush, and she has been using her swish and swallow mouthwash, which has cleared up.She has been using her CPAP every night. She states she has no daytime fatigue. She is taking Melatonin 10 mg at night to help with sleep. She is still smoking about 1/2 pack per day. She is compliant with her metformin. She is checking her blood sugars.She has a follow up with her PCP this week.    Observations/Objective: NPSG 02/2017 >> no OSA HST 04/2017 15/h  10/2016 PFTs showed ratio 49, FEV1 of 36%, FVC 58% and DLCO of 34% consistent with severe obstruction.  11/2016 CT chestbullous changes BL, 16mmRUl &2 mm LULnodules >>stable 11/2017  09/2016 echo normal.  11/28/2017 IMPRESSION: Stable 4 mm right upper lobe nodule and 2 mm left upper lobe nodule. A previously demonstrated 3 mm left upper lobe nodule is no longer seen. No follow-up needed if patient is low-risk (and has no known or suspected primary neoplasm). Non-contrast chest CT can be considered in 12 months if patient is high-risk. This recommendation follows the consensus statement: Guidelines for Management of Incidental Pulmonary Nodules Detected on CT Images: From the Fleischner Society 2017; Radiology 2017; 284:228-243. Changes of COPD with centrilobular emphysema. Calcific coronary artery and aortic atherosclerosis.   Assessment and Plan: COPD, severe ([place]) For Your COPD We will send you paperwork for financial assistance with your medications Fill these out and return them to the drug manufacturer. They will let you know if you qualify for financial assistance. We will send in a prescription for your albuterol nebs and your Singulair. ( 4 refills)  This will get you through one year. Continue your Singulair 10 mg daily Continue your Albuterol nebs as needed  Continue your Symbicort 2 puff twice daily Rinse mouth after use Spiriva 2 puffs every morning. Continue Daliresp one  500 mcg  tablet daily Add Mucinex 1200 mg once to twice daily with a full glass of water to thin secretions. Note your daily symptoms >remember "red flags" for COPD: Increase in cough, increase in sputum production, increase in shortness of breath or activity intolerance. If you notice these symptoms, please call to be seen.  Follow up with Cathyann Kilfoyle [name] 08/24/2018 at 2 pm We will schedule you to see Joan [name] once he is back in the office. Please contact office for sooner follow up if symptoms do not improve or worsen or seek emergency care      OSA on CPAP Good Compliance AHI is 1.7  For your OSA Continue on CPAP at bedtime. You appear to be benefiting from the treatment Goal is to wear for at least 6 hours each night for maximal clinical benefit. Continue to work on weight loss, as the link between excess weight  and sleep apnea is well established.  Do not drive if sleepy. Remember to clean mask, tubing, filter, and reservoir once weekly with soapy water.    Pulmonary nodule Recently started smoking again and then quit 02/28/2018 Plan: Will need follow up CT chest without contrast 11/2018  Tobacco abuse Continues to smoke 1/2 PPD It is important that you  do not smoke at all. Let us know if there is anything we can do to help you quit smoking I have spent 4 minutes counseling patient on smoking cessation this visit. Patient verbalizes understanding of the importance to remain  Smoke free due to the  the negative health consequences including worsening of COPD, risk of lung cancer , stroke and heart disease.  With your underlying pulmonary disease, you are at a higher risk for poor outcome if you contract Covid 19. Continue to self isolate, wash hand frequently, and wear a face mask if you leave your home. Remember to utilize the early hours at grocery stores/ drug stores  for people > 66 years old or any underlying health risks, as the stores are cleaner and less crowded at these  times.    Follow Up Instructions: Follow up appointment with Judson Roch [name] June 29 at 2 pm  Follow up CT scan 11/2018 for nodule follow up  Quit smoking, this is very important.      I discussed the assessment and treatment plan with the patient. The patient was provided an opportunity to ask questions and all were answered. The patient agreed with the plan and demonstrated an understanding of the instructions.   The patient was advised to call back or seek an in-person evaluation if the symptoms worsen or if the condition fails to improve as anticipated.  I provided 40  minutes of non-face-to-face time during this encounter.   [name], [name] 06/01/2018  2:59 PM

## 2018-06-01 NOTE — Telephone Encounter (Signed)
Copied from [place] [id number]. Topic: Quick Communication - See Telephone Encounter >> Jun 01, 2018  3:21 PM Loma [place] wrote: CRM for notification. See Telephone encounter for: 06/01/18. PT has web appt.  At 3:20 She can not get on, want telephone appt instead, tried 3 times and getting the answering machine

## 2018-06-01 NOTE — Telephone Encounter (Signed)
Joan crawford do any other form of visit

## 2018-06-01 NOTE — Telephone Encounter (Signed)
Patient has already had her visit with Dr. Sharlet Salina

## 2018-06-02 NOTE — Telephone Encounter (Signed)
Alli had an televisit with SG on 06/01/2018. I will close this encounter.

## 2018-06-03 NOTE — Telephone Encounter (Signed)
Patient seen 06/01/18 By Eric Form. Nothing further needed at this time.

## 2018-06-04 ENCOUNTER — Telehealth: Payer: Self-pay | Admitting: Pulmonary Disease

## 2018-06-04 NOTE — Telephone Encounter (Addendum)
Pt is calling back stating she needs PA for all of her lung meds to [employer] [phone].  Part B vs Part D determination.  Pt can be reached at [phone].

## 2018-06-04 NOTE — Telephone Encounter (Signed)
Attempted to call pt x2 but line rang busy. Will try to call back later.

## 2018-06-04 NOTE — Telephone Encounter (Signed)
Attempted to call pt x2 but each time received a busy signal. Will try to call back later. 

## 2018-06-08 ENCOUNTER — Telehealth: Payer: Self-pay | Admitting: Acute Care

## 2018-06-08 NOTE — Telephone Encounter (Signed)
ATC line busy. Will try again later.

## 2018-06-08 NOTE — Telephone Encounter (Signed)
I called pt X 3 and did not receive an answer due to a busy signal. I was unable to get through. I need more information to help her with her request. Will try again tomorrow.

## 2018-06-09 DIAGNOSIS — J449 Chronic obstructive pulmonary disease, unspecified: Secondary | ICD-10-CM | POA: Diagnosis not present

## 2018-06-09 DIAGNOSIS — J441 Chronic obstructive pulmonary disease with (acute) exacerbation: Secondary | ICD-10-CM | POA: Diagnosis not present

## 2018-06-09 DIAGNOSIS — J9611 Chronic respiratory failure with hypoxia: Secondary | ICD-10-CM | POA: Diagnosis not present

## 2018-06-09 MED ORDER — ALBUTEROL SULFATE (2.5 MG/3ML) 0.083% IN NEBU: 2.5000 mg | INHALATION_SOLUTION | Freq: Two times a day (BID) | RESPIRATORY_TRACT | 3 refills | Status: DC

## 2018-06-09 NOTE — Telephone Encounter (Signed)
Pt came into the office to drop off financial assistance forms for Combivent, spiriva, and symbicort. I took forms so SG can sign. She is going to bring back the information to attach to her application. I advised her about the phone not letting us go through. I called from a private number and her phone let us through. I am in the [place] and dont have a phone to call the number the pt gave to call about her insurance.  She stated the insurance company advised her that she could get her medications covered through part B. I advised her that this was only for nebulizer medications. I offered to send her neb prescription into [facility] pharmacy to see if she can get it free or cheaper under her part B. Pt agreed. Rx for albuterol sent to [place].  Triage please call pt using *[phone]

## 2018-06-09 NOTE — Telephone Encounter (Signed)
Attempted to call pt x2 but received a busy signal and unable to leave a VM. Will try to call back later.

## 2018-06-09 NOTE — Telephone Encounter (Signed)
Attempted to return call to patient.  Line busy. Multiple attempt to patient without answer or line busy.  Also a phone note 06/04/18 has additional attempts to contact.  Will close this encounter due to protocol of unable to reach.

## 2018-06-10 NOTE — Telephone Encounter (Signed)
LMTCB

## 2018-06-11 NOTE — Telephone Encounter (Signed)
Patient states needs to talk to someone about forms she received.  Patient phone number is [phone].

## 2018-06-11 NOTE — Telephone Encounter (Signed)
Attempted to call pt via my cell by doing *[phone] since we cannot get through to her with the office phone but unable to reach pt. Left message for pt to return call.

## 2018-06-11 NOTE — Telephone Encounter (Signed)
Patient returning phone call.  Patient phone number is [phone].

## 2018-06-11 NOTE — Telephone Encounter (Signed)
Called and spoke w/ pt via *67 on my cell phone. Pt states she dropped off patient assistance forms 2 days ago for Combivent, Spiriva, and Symbicort for RA to sign. States she's filled out her portion and will be bringing additional paperwork for it on Monday 06/15/2018. Pt wanted to make sure we hadn't faxed the forms yet. According to note from 06/09/2018, form was taken to SG to sign and awaiting the additional attachments per Jonelle Sidle. Spoke w/ Jonelle Sidle to verify this. Made pt aware and let her know if she has any other questions or concerns to give [place] a call. Nothing further needed at this time.

## 2018-06-17 ENCOUNTER — Telehealth: Payer: Self-pay | Admitting: Acute Care

## 2018-06-17 NOTE — Telephone Encounter (Signed)
Attempted to call Kevin Fenton in regards to the call of pt's med needing PA but unable to reach. Left a detailed message for Kevin Fenton to have her fax the PA request to our office that was needed on pt's med and we would get it taken care of. Nothing further needed.

## 2018-06-18 NOTE — Telephone Encounter (Signed)
I received the form from her insurance, filled it out and faxed back to the insurance company Will await response.

## 2018-06-19 NOTE — Telephone Encounter (Signed)
Fax received from [place] dated 06/18/18. The PA submitted for Symbicort 160 was denied. "The requested drug is on your plan's drug list and will continue to be covered at the tier 3 copay."  Will contact [facility] to see if alternatives are available.

## 2018-06-24 ENCOUNTER — Ambulatory Visit: Payer: Medicare Other | Admitting: Nurse Practitioner

## 2018-06-24 ENCOUNTER — Telehealth: Payer: Self-pay | Admitting: Acute Care

## 2018-06-24 ENCOUNTER — Other Ambulatory Visit: Payer: Self-pay

## 2018-06-24 ENCOUNTER — Encounter: Payer: Self-pay | Admitting: Nurse Practitioner

## 2018-06-24 DIAGNOSIS — R911 Solitary pulmonary nodule: Secondary | ICD-10-CM

## 2018-06-24 DIAGNOSIS — J441 Chronic obstructive pulmonary disease with (acute) exacerbation: Secondary | ICD-10-CM

## 2018-06-24 DIAGNOSIS — G4733 Obstructive sleep apnea (adult) (pediatric): Secondary | ICD-10-CM | POA: Diagnosis not present

## 2018-06-24 DIAGNOSIS — Z9989 Dependence on other enabling machines and devices: Secondary | ICD-10-CM | POA: Diagnosis not present

## 2018-06-24 DIAGNOSIS — Z720 Tobacco use: Secondary | ICD-10-CM

## 2018-06-24 MED ORDER — ROFLUMILAST 250 MCG PO TABS: 500.0000 ug | ORAL_TABLET | Freq: Every day | ORAL | 0 refills | Status: DC

## 2018-06-24 MED ORDER — METHYLPREDNISOLONE ACETATE 80 MG/ML IJ SUSP
80.0000 mg | Freq: Once | INTRAMUSCULAR | Status: AC
  Administered 2018-06-24: 80 mg via INTRAMUSCULAR

## 2018-06-24 MED ORDER — BUDESONIDE-FORMOTEROL FUMARATE 160-4.5 MCG/ACT IN AERO: 2.0000 | INHALATION_SPRAY | Freq: Two times a day (BID) | RESPIRATORY_TRACT | 0 refills | Status: DC

## 2018-06-24 MED ORDER — PREDNISONE 10 MG PO TABS: ORAL_TABLET | ORAL | 0 refills | Status: DC

## 2018-06-24 MED ORDER — LEVOFLOXACIN 500 MG PO TABS: 500.0000 mg | ORAL_TABLET | Freq: Every day | ORAL | 0 refills | Status: DC

## 2018-06-24 NOTE — Assessment & Plan Note (Addendum)
Patient presents today with cough, wheezing, shortness of breath with exertion.  She has not been taking her medications due to financial concerns, but she now has financial assistance and medications have been sent to her mail-in pharmacy and should be arriving at her home soon.  We will give her some samples today.  We could not order chest x ray due to not having x ray available this afternoon.   Patient Instructions  COPD, severe:  Depo-medrol given in office today Will order prednisone taper Will order Levaquin  Your medications have been approved and should be sent through mail order soon Continue your Singulair 10 mg daily Continue your Albuterol nebs as needed  Continue your Symbicort 2 puff twice daily (samples given) Rinse mouth after use Spiriva 2 puffs every morning. Continue Daliresp one 500 mcg tablet daily (samples given) Add Mucinex 1200 mg once to twice daily with a full glass of water to thin secretions. Note your daily symptoms >remember "red flags" for COPD: Increase in cough, increase in sputum production, increase in shortness of breath or activity intolerance. If you notice these symptoms, please call to be seen.        With your underlying pulmonary disease, you are at a higher risk for poor outcome if you contract Covid 19. Continue to self isolate, wash hand frequently, and wear a face mask if you leave your home. Remember to utilize the early hours at grocery stores/ drug stores for people >16 years old or any underlying health risks, as the stores are cleaner and less crowded at these times.  Follow up: We will schedule you to see Dr. Elsworth Soho once he is back in the office. Please contact office for sooner follow up if symptoms do not improve or worsen or seek emergency care

## 2018-06-24 NOTE — Telephone Encounter (Signed)
Patient came to office to drop off Pt assistance paperwork for [place].  Paper work placed in [employer] Patient stated she is feeling increased SHOB with exertion, hoarseness, and productive cough, with milky white. Patient stated she gets winded while talking. Patient sees ENT for hoarseness. Patient stated she is concerned with [facility]. Patient stated she is on O2 2 liters, but the portable tank is to heavy. Patient stated she has a POC, but it is to heavy. DME- [employer], Patient stated Randall Hiss at [employer] told her she can get a smaller POC. Patient agreed to OV with Kenney Houseman, NP today at 4pm.

## 2018-06-24 NOTE — Patient Instructions (Addendum)
COPD, severe:  Depo-medrol given in office today Will order prednisone taper Will order Levaquin  Your medications have been approved and should be sent through mail order soon Continue your Singulair 10 mg daily Continue your Albuterol nebs as needed  Continue your Symbicort 2 puff twice daily (samples given) Rinse mouth after use Spiriva 2 puffs every morning. Continue Daliresp one 500 mcg tablet daily (samples given) Add Mucinex 1200 mg once to twice daily with a full glass of water to thin secretions. Note your daily symptoms >remember "red flags" for COPD: Increase in cough, increase in sputum production, increase in shortness of breath or activity intolerance. If you notice these symptoms, please call to be seen.   OSA on CPAP Good Compliance AHI is 1.7  For your OSA Continue on CPAP at bedtime. You appear to be benefiting from the treatment Goal is to wear for at least 6 hours each night for maximal clinical benefit. Continue to work on weight loss, as the link between excess weight and sleep apnea is well established.  Do not drive if sleepy. Remember to clean mask, tubing, filter, and reservoir once weekly with soapy water.   Pulmonary nodule Recently started smoking again and then quit 02/28/2018 Plan: Will need follow up CT chest without contrast 11/2018  Tobacco abuse Continues to smoke 1/2 PPD It is important that you do not smoke at all. Let us know if there is anything we can do to help you quit smoking Smoking cessation instruction/counseling given:  counseled patient on the dangers of tobacco use, advised patient to stop smoking, and reviewed strategies to maximize success  With your underlying pulmonary disease, you are at a higher risk for poor outcome if you contract Covid 19. Continue to self isolate, wash hand frequently, and wear a face mask if you leave your home. Remember to utilize the early hours at grocery stores/ drug stores for people  >43 years old or any underlying health risks, as the stores are cleaner and less crowded at these times.  Follow up: We will schedule you to see Dr. Elsworth Soho once he is back in the office. Please contact office for sooner follow up if symptoms do not improve or worsen or seek emergency care

## 2018-06-24 NOTE — Progress Notes (Signed)
$'@Patient'Y$  ID: Joan [name], female    DOB: 1960-09-01, 58 y.o.   MRN: [id number]  Chief Complaint  Patient presents with  . Acute Visit    Increased SOB, lightheadness, productive cough with while phlegm. Normally uses 2L of O2 at home.     Referring provider: [name], *  HPI 58 y.o.femalecurrent every day smokerwith COPD, steroid induced DMand OSA,bipolar disorder, hypertension, hypothyroidism. She is followed by Dr. Princella Pellegrini CPAP therapy and COPD.She has been hospitalized 12/2017 and 04/2018 with COPD exacerbations.  Maintenance : Spiriva Respimat/ Symbicort Combivent Respimat Albuterol nebs prn  Tests: NPSG 02/2017 >> no OSA HST 04/2017 15/h  10/2016 PFTs showed ratio 49, FEV1 of 36%, FVC 58% and DLCO of 34% consistent with severe obstruction.  11/2016 CT chestbullous changes BL, 9mRUl &2 mm LULnodules >>stable 11/2017  09/2016 echo normal.  11/28/2017 IMPRESSION: Stable 4 mm right upper lobe nodule and 2 mm left upper lobe nodule. A previously demonstrated 3 mm left upper lobe nodule is no longer seen. No follow-up needed if patient is low-risk (and has no known or suspected primary neoplasm). Non-contrast chest CT can be considered in 12 months if patient is high-risk. This recommendation follows the consensus statement: Guidelines for Management of Incidental Pulmonary Nodules Detected on CT Images: From the Fleischner Society 2017; Radiology 2017; 284:228-243. Changes of COPD with centrilobular emphysema. Calcific coronary artery and aortic atherosclerosis.  OV 06/24/18 - acute shortness of breath and cough/wheezing Patient presents today for an acute visit for shortness of breath, cough, and wheezing.  She states this is started 3 weeks ago.  She has been having issues getting her medications filled due to financial concerns.  It appears that all the medications have now been sent through to mail order pharmacy and she should be getting  them soon.  States that her cough is productive of yellow sputum.  She does use CPAP at night and has oxygen with it.  She also wears 2 L of O2 with exertion.  Unfortunately, patient does still continue to smoke.  She is still smoking about a half a pack per day.  She denies any recent fever. Denies f/c/s, n/v/d, hemoptysis, PND, leg swelling.    Allergies  Allergen Reactions  . Keflex [Cephalexin] Other (See Comments)    unspecified  . Prednisone Other (See Comments)    "counter reacts"  . Shellfish Allergy Other (See Comments)    Stomach cramps  . Tetracyclines & Related Other (See Comments)    unspecified  . Dilaudid [Hydromorphone Hcl] Other (See Comments)    "Lethargy"  . Incruse Ellipta [Umeclidinium Bromide] Nausea Only  . Tuberculin Tests Other (See Comments)    Immunization History  Administered Date(s) Administered  . Influenza,inj,Quad PF,6+ Mos 11/25/2016, 11/11/2017    Past Medical History:  Diagnosis Date  . Acute respiratory failure with hypoxia ([place]   . Alcoholism and drug addiction in family   . Anxiety   . Arthritis    "knees, hips, lower back" (09/25/2016)  . Asthma   . Bipolar disorder ([facility]   . Chronic bronchitis ([place]    "all the time" (09/25/2016)  . Chronic leg pain    RLE  . Chronic lower back pain   . COPD (chronic obstructive pulmonary disease) ([facility]   . Depression   . Emphysema of lung ([facility]   . GERD (gastroesophageal reflux disease)   . Headache   . Hepatitis C    "treated back in 2001-2002"  . High cholesterol   .  Hypertension   . Hypothyroidism   . Incarcerated ventral hernia 04/04/2017  . On home oxygen therapy    "2L; at nighttime" (04/04/2017)  . OSA on CPAP   . Pneumonia    "all the time" (09/25/2016)  . Positive PPD    with negative chest x ray  . Tachycardia    patient reports with exertion ; denies any other conditions   . Thyroid disease     Tobacco History: Social History   Tobacco Use  Smoking Status Current Every Day  Smoker  . Packs/day: 1.00  . Years: 44.00  . Pack years: 44.00  . Types: Cigarettes  Smokeless Tobacco Never Used  Tobacco Comment   Smoking 1/2 PPD   Ready to quit: Not Answered Counseling given: Not Answered Comment: Smoking 1/2 PPD   Outpatient Encounter Medications as of 06/24/2018  Medication Sig  . acyclovir (ZOVIRAX) 400 MG tablet Take 1 tablet (400 mg total) by mouth 2 (two) times daily.  Marland Kitchen albuterol (PROVENTIL) (2.5 MG/3ML) 0.083% nebulizer solution Take 3 mLs (2.5 mg total) by nebulization 2 (two) times daily. And every 6 hours as needed.  DX: J44.9  . atenolol (TENORMIN) 25 MG tablet Take 1 tablet (25 mg total) by mouth daily.  Marland Kitchen atorvastatin (LIPITOR) 20 MG tablet Take 1 tablet (20 mg total) by mouth daily.  . baclofen (LIORESAL) 10 MG tablet Take 10 mg by mouth at bedtime.  . Blood Glucose Monitoring Suppl (ONE TOUCH ULTRA 2) w/Device KIT Use to check blood sugars twice a day  . buPROPion (WELLBUTRIN XL) 150 MG 24 hr tablet Take 1 tablet (150 mg total) by mouth daily.  . cyclobenzaprine (FLEXERIL) 10 MG tablet Take 1 tablet (10 mg total) by mouth at bedtime.  . diphenhydrAMINE (BENADRYL) 25 MG tablet Take 25 mg by mouth 2 (two) times daily.   Marland Kitchen gabapentin (NEURONTIN) 300 MG capsule Take 1 capsule (300 mg total) by mouth 2 (two) times daily.  Marland Kitchen glucose blood (ONE TOUCH ULTRA TEST) test strip Use to check blood sugars twice a day  . hydrochlorothiazide (HYDRODIURIL) 25 MG tablet Take 1 tablet (25 mg total) by mouth daily.  . Ipratropium-Albuterol (COMBIVENT RESPIMAT) 20-100 MCG/ACT AERS respimat Inhale 1 puff into the lungs every 6 (six) hours as needed for wheezing. (Patient taking differently: Inhale 1 puff into the lungs every 6 (six) hours. )  . Lancets (ONETOUCH ULTRASOFT) lancets Use to check blood sugars twice a day  . levothyroxine (SYNTHROID, LEVOTHROID) 25 MCG tablet Take 1 tablet (25 mcg total) by mouth daily before breakfast.  . montelukast (SINGULAIR) 10 MG  tablet Take 1 tablet (10 mg total) by mouth at bedtime.  . naproxen (NAPROSYN) 500 MG tablet Take 1 tablet (500 mg total) by mouth 2 (two) times daily with a meal. (Patient taking differently: Take 500 mg by mouth 2 (two) times daily as needed for mild pain or headache. )  . nystatin-triamcinolone ointment (MYCOLOG) Apply 1 application topically 2 (two) times daily as needed (applied to skin folds/groins/under breast skin irritation rash.).  Marland Kitchen OXYGEN Inhale 2 L into the lungs daily as needed (strenous activity).   . pantoprazole (PROTONIX) 40 MG tablet Take 1 tablet (40 mg total) by mouth daily.  . roflumilast (DALIRESP) 500 MCG TABS tablet Take 1 tablet (500 mcg total) by mouth daily.  . SYMBICORT 160-4.5 MCG/ACT inhaler Inhale 2 puffs into the lungs 2 (two) times daily.  . traZODone (DESYREL) 100 MG tablet Take 1 tablet (100 mg total)  by mouth at bedtime.  Marland Kitchen venlafaxine XR (EFFEXOR-XR) 150 MG 24 hr capsule TAKE 1 CAPSULE DAILY WITH  BREAKFAST (Patient taking differently: Take 150 mg by mouth daily with breakfast. )  . budesonide-formoterol (SYMBICORT) 160-4.5 MCG/ACT inhaler Inhale 2 puffs into the lungs 2 (two) times daily.  Marland Kitchen levofloxacin (LEVAQUIN) 500 MG tablet Take 1 tablet (500 mg total) by mouth daily.  . metFORMIN (GLUCOPHAGE) 500 MG tablet Take 1 tablet (500 mg total) by mouth 2 (two) times daily with a meal for 30 days.  . predniSONE (DELTASONE) 10 MG tablet Take 4 tabs for 2 days, then 3 tabs for 2 days, then 2 tabs for 2 days, then 1 tab for 2 days, then stop  . Roflumilast (DALIRESP) 250 MCG TABS Take 500 mcg by mouth daily.  . [DISCONTINUED] chlorpheniramine-HYDROcodone (TUSSIONEX PENNKINETIC ER) 10-8 MG/5ML SUER Take 5 mLs by mouth every 12 (twelve) hours as needed for cough.  . [DISCONTINUED] predniSONE (DELTASONE) 10 MG tablet Take 4 pills daily for 3 days then 3 pills daily for 3 days then 2 pills daily for 3 days then 1 pill daily for 3 days then stop  . [EXPIRED]  methylPREDNISolone acetate (DEPO-MEDROL) injection 80 mg    No facility-administered encounter medications on file as of 06/24/2018.      Review of Systems  Review of Systems  Constitutional: Negative.  Negative for chills and fever.  HENT: Negative.   Respiratory: Positive for cough, shortness of breath and wheezing.   Cardiovascular: Negative.  Negative for chest pain, palpitations and leg swelling.  Gastrointestinal: Negative.   Allergic/Immunologic: Negative.   Neurological: Negative.   Psychiatric/Behavioral: Negative.        Physical Exam  BP 110/60 (BP Location: Left Arm, Patient Position: Sitting, Cuff Size: Normal)   Pulse 84   Temp 98.3 F (36.8 C) (Oral)   Ht '5\' 8"'$  (1.727 m)   Wt 214 lb (97.1 kg)   SpO2 91%   BMI 32.54 kg/m   Wt Readings from Last 5 Encounters:  06/24/18 214 lb (97.1 kg)  05/21/18 216 lb 11.4 oz (98.3 kg)  04/06/18 217 lb (98.4 kg)  03/26/18 218 lb (98.9 kg)  03/25/18 219 lb (99.3 kg)     Physical Exam Vitals signs and nursing note reviewed.  Constitutional:      General: She is not in acute distress.    Appearance: She is well-developed.  Cardiovascular:     Rate and Rhythm: Normal rate and regular rhythm.  Pulmonary:     Effort: Pulmonary effort is normal. No respiratory distress.     Breath sounds: Wheezing present. No rhonchi.  Musculoskeletal:        General: No swelling.  Neurological:     Mental Status: She is alert and oriented to person, place, and time.       Assessment & Plan:   COPD exacerbation ([place]) Patient presents today with cough, wheezing, shortness of breath with exertion.  She has not been taking her medications due to financial concerns, but she now has financial assistance and medications have been sent to her mail-in pharmacy and should be arriving at her home soon.  We will give her some samples today.  We could not order chest x ray due to not having x ray available this afternoon.   Patient  Instructions  COPD, severe:  Depo-medrol given in office today Will order prednisone taper Will order Levaquin  Your medications have been approved and should be sent through mail order  soon Continue your Singulair 10 mg daily Continue your Albuterol nebs as needed  Continue your Symbicort 2 puff twice daily (samples given) Rinse mouth after use Spiriva 2 puffs every morning. Continue Daliresp one 500 mcg tablet daily (samples given) Add Mucinex 1200 mg once to twice daily with a full glass of water to thin secretions. Note your daily symptoms >remember "red flags" for COPD: Increase in cough, increase in sputum production, increase in shortness of breath or activity intolerance. If you notice these symptoms, please call to be seen.        With your underlying pulmonary disease, you are at a higher risk for poor outcome if you contract Covid 19. Continue to self isolate, wash hand frequently, and wear a face mask if you leave your home. Remember to utilize the early hours at grocery stores/ drug stores for people >31 years old or any underlying health risks, as the stores are cleaner and less crowded at these times.  Follow up: We will schedule you to see Dr. Elsworth Soho once he is back in the office. Please contact office for sooner follow up if symptoms do not improve or worsen or seek emergency care      Pulmonary nodule Pulmonary nodule Recently started smoking again and then quit 02/28/2018 Plan: Will need follow up CT chest without contrast 11/2018   OSA on CPAP OSA on CPAP Good Compliance AHI is 1.7  For your OSA Continue on CPAP at bedtime. You appear to be benefiting from the treatment Goal is to wear for at least 6 hours each night for maximal clinical benefit. Continue to work on weight loss, as the link between excess weight and sleep apnea is well established.  Do not drive if sleepy. Remember to clean mask, tubing, filter, and reservoir once weekly  with soapy water.  Tobacco abuse Tobacco abuse Continues to smoke 1/2 PPD It is important that you do not smoke at all. Let us know if there is anything we can do to help you quit smoking Smoking cessation instruction/counseling given:  counseled patient on the dangers of tobacco use, advised patient to stop smoking, and reviewed strategies to maximize success      [name], NP 06/25/2018

## 2018-06-24 NOTE — Telephone Encounter (Signed)
The PA was for a tier exception.  Symbicort is covered by [employer], but is not eligible for a lower tier.  Nothing further needed at this time- will close encounter.

## 2018-06-24 NOTE — Telephone Encounter (Signed)
Patient brought patient assistance paperwork, [employer], and BI cares to office today, 06/24/18, for [name],NP.   Patient stated she is needing medical necessity letter sent to her insurance company for assistance.   Message routed to Sela Hilding, NP and Jonelle Sidle, [place]

## 2018-06-24 NOTE — Telephone Encounter (Signed)
Joan [name], please let me know what needs to be done for this letter. Thanks

## 2018-06-25 ENCOUNTER — Encounter: Payer: Self-pay | Admitting: Nurse Practitioner

## 2018-06-25 NOTE — Assessment & Plan Note (Signed)
Tobacco abuse Continues to smoke 1/2 PPD It is important that you do not smoke at all. Let us know if there is anything we can do to help you quit smoking Smoking cessation instruction/counseling given:  counseled patient on the dangers of tobacco use, advised patient to stop smoking, and reviewed strategies to maximize success

## 2018-06-25 NOTE — Assessment & Plan Note (Signed)
Pulmonary nodule Recently started smoking again and then quit 02/28/2018 Plan: Will need follow up CT chest without contrast 11/2018

## 2018-06-25 NOTE — Assessment & Plan Note (Signed)
OSA on CPAP Good Compliance AHI is 1.7  For your OSA Continue on CPAP at bedtime. You appear to be benefiting from the treatment Goal is to wear for at least 6 hours each night for maximal clinical benefit. Continue to work on weight loss, as the link between excess weight and sleep apnea is well established.  Do not drive if sleepy. Remember to clean mask, tubing, filter, and reservoir once weekly with soapy water.

## 2018-06-26 NOTE — Telephone Encounter (Signed)
I was only waiting on her to bring her stubs and information for the forms. I am confused on what we need to do. I have the forms with me.

## 2018-06-29 ENCOUNTER — Ambulatory Visit: Payer: Medicare HMO | Admitting: Acute Care

## 2018-07-02 ENCOUNTER — Encounter: Payer: Self-pay | Admitting: Physical Therapy

## 2018-07-02 ENCOUNTER — Other Ambulatory Visit: Payer: Self-pay

## 2018-07-02 ENCOUNTER — Telehealth: Payer: Self-pay | Admitting: Acute Care

## 2018-07-02 ENCOUNTER — Ambulatory Visit: Payer: Medicare Other | Attending: Internal Medicine | Admitting: Physical Therapy

## 2018-07-02 DIAGNOSIS — G8929 Other chronic pain: Secondary | ICD-10-CM

## 2018-07-02 DIAGNOSIS — M25551 Pain in right hip: Secondary | ICD-10-CM

## 2018-07-02 DIAGNOSIS — R262 Difficulty in walking, not elsewhere classified: Secondary | ICD-10-CM | POA: Diagnosis not present

## 2018-07-02 DIAGNOSIS — M545 Low back pain, unspecified: Secondary | ICD-10-CM

## 2018-07-02 DIAGNOSIS — M6281 Muscle weakness (generalized): Secondary | ICD-10-CM | POA: Insufficient documentation

## 2018-07-02 DIAGNOSIS — M25552 Pain in left hip: Secondary | ICD-10-CM | POA: Diagnosis not present

## 2018-07-02 NOTE — Therapy (Signed)
[facility] [address] [place], [place], [address] Phone: [phone]   Fax:  [phone]  Physical Therapy Evaluation  Patient Details  Name: Joan [name] MRN: [id number] Date of Birth: February 02, 1961 Referring Provider (PT): [name], MD   Encounter Date: 07/02/2018   I connected with  Joan [name] on 07/02/18 by [place] video conference and verified that I am speaking with the correct person using two identifiers.  I discussed the limitations, risks, security and privacy concerns of performing an evaluation and management service by Webex and the availability of in person appointments.   I also discussed with the patient that there may be a patient responsible charge related to this service. The patient expressed understanding and agreed to proceed.   The patient's address was confirmed.  Identified to the patient that therapist is a licensed physical therapist in the state of [place].  Verified phone # as [phone] to call in case of technical difficulties.   PT End of Session - 07/02/18 1329    Visit Number  1    Number of Visits  1   one visit for w/c evaluation   Date for PT Re-Evaluation  07/02/18    Authorization Type  [employer] Medicare    PT Start Time  0935    PT Stop Time  1050    PT Time Calculation (min)  75 min    Activity Tolerance  Patient tolerated treatment well    Behavior During Therapy  WFL for tasks assessed/performed       Past Medical History:  Diagnosis Date  . Acute respiratory failure with hypoxia ([place])   . Alcoholism and drug addiction in family   . Anxiety   . Arthritis    "knees, hips, lower back" (09/25/2016)  . Asthma   . Bipolar disorder ([place])   . Chronic bronchitis ([place])    "all the time" (09/25/2016)  . Chronic leg pain    RLE  . Chronic lower back pain   . COPD (chronic obstructive pulmonary disease) ([place])   . Depression   . Emphysema of lung ([place])   . GERD  (gastroesophageal reflux disease)   . Headache   . Hepatitis C    "treated back in 2001-2002"  . High cholesterol   . Hypertension   . Hypothyroidism   . Incarcerated ventral hernia 04/04/2017  . On home oxygen therapy    "2L; at nighttime" (04/04/2017)  . OSA on CPAP   . Pneumonia    "all the time" (09/25/2016)  . Positive PPD    with negative chest x ray  . Tachycardia    patient reports with exertion ; denies any other conditions   . Thyroid disease     Past Surgical History:  Procedure Laterality Date  . BACK SURGERY    . CARDIAC CATHETERIZATION    . COLONOSCOPY    . CYST EXCISION     removal of cyst in sinuses  . DILATION AND CURETTAGE OF UTERUS    . FOOT SURGERY Bilateral    bone removed from 4th and 5 th toes and put in pin then took pin out  . HERNIA REPAIR    . INCISION AND DRAINAGE  ~ 2014/2015   "removed mesh & infection"  . INCISIONAL HERNIA REPAIR N/A 04/04/2017   Procedure: LAPAROSCOPIC INCISIONAL HERNIA REPAIR WITH MESH;  Surgeon: Fanny Skates, MD;  Location: [facility];  Service: General;  Laterality: N/A;  . LACERATION REPAIR Right  repair wrist artery from laceration from ice skate  . LAPAROSCOPIC INCISIONAL / UMBILICAL / VENTRAL HERNIA REPAIR  04/04/2017   LAPAROSCOPIC INCISIONAL HERNIA REPAIR WITH MESH  . LIPOMA EXCISION Right    fattty lipoma in neck  . NASAL SEPTUM SURGERY    . POSTERIOR LUMBAR FUSION  2015/2016 X 2   "added rods and screws"  . TOTAL HIP ARTHROPLASTY Right 02/05/2017   Procedure: RIGHT TOTAL HIP ARTHROPLASTY;  Surgeon: [name], MD;  Location: [facility];  Service: Orthopedics;  Laterality: Right;  . TOTAL HIP ARTHROPLASTY Left 06/18/2017   Procedure: LEFT TOTAL HIP ARTHROPLASTY;  Surgeon: [name], MD;  Location: [facility];  Service: Orthopedics;  Laterality: Left;  . TUBAL LIGATION    . UMBILICAL HERNIA REPAIR  ~ 2013   w/mesh  . UPPER GI ENDOSCOPY      There were no vitals filed for this visit.   Subjective Assessment -  07/02/18 1144    Subjective  Patient referred to physical therapy for power mobility evaluation.  Pt presents with severe COPD that significantly limits her mobility and independence with ADL in her home.    Patient is accompained by:  Family member    Patient Stated Goals  to obtain a power wheelchair to for energy conservation and independence    Currently in Pain?  No/denies   not at rest, increases to 10/10 with prolonged standing        [facility] PT Assessment - 07/02/18 1328      Assessment   Medical Diagnosis  COPD    Referring Provider (PT)  [name], MD    Onset Date/Surgical Date  03/26/18    Hand Dominance  Right      Balance Screen   Has the patient fallen in the past 6 months  No      Prior Function   Level of Independence  Requires assistive device for independence;Needs assistance with homemaking;Independent with household mobility with device;Independent with basic ADLs      Observation/Other Assessments   Focus on Therapeutic Outcomes (FOTO)   Not performed        Mobility/Seating Evaluation    PATIENT INFORMATION: Name: Joan [name] DOB: [birth year]  Sex: Female Date seen: 07/02/2018 Time: 09:30  Address:  [address] [place], [place] [address] Physician: [name], MD This evaluation/justification form will serve as the LMN for the following suppliers: __________________________ Supplier: [employer] Contact Person: Luz Brazen, ATP Phone:  [phone]   Seating Therapist: Misty Stanley, PT Phone:   [phone]   Phone: [phone]    Spouse/Parent/Caregiver name: Marion   Phone number: [phone] Insurance/Payer: [place] [employer], [place]     Reason for Referral: To obtain a motorized wheelchair  Patient/Caregiver Goals: To improve her household and community mobility and maintain independence in her home. Pt currently has 35% lung capacity; pt is having more difficulty  with breathing when performing ADLs and activities in the community  Patient was seen for face-to-face evaluation for new power wheelchair.  Also present was [employer], ATP to discuss recommendations and wheelchair options.  Further paperwork was completed and sent to vendor.  Patient appears to qualify for power mobility device at this time per objective findings.   MEDICAL HISTORY: Diagnosis: Primary Diagnosis: severe COPD - on home 02 therapy Onset: >10 years Diagnosis: Osteoarthritis in multiple joints   '[x]'$ Progressive Disease Relevant past and future surgeries: R and L THA; 2 lumbar fusions;  may have to have cervical surgery   Height: 5'7" Weight: 214 lb Explain recent changes or trends in weight: None, weight stable   History including Falls: No falls but muliple episodes of near syncope/lightheadedness when standing; Aortic Atherosclerosis, pulmonary nodule, acute respiratory failure with hypoxia, anxiety, asthma, bipolar disorder, chronic bronchitis, chronic leg pain, chronic low back pain, depression, emphysema of lung, hepatitis C, high cholesterol, hypertension, hypothyroidism, OSA with CPAP, PNA, positive PPD (negative chest x-ray), tachycardia, tobacco use 1/2 PPD    HOME ENVIRONMENT: [x] House  [] Condo/town home  [] Apartment  [] Assisted Living    [] Lives Alone [x]  Lives with Others                                                                                          Hours with caregiver: Brother works, Sister is with pt but sister has had major back surgery and is unable to lift heavy items  [x] Home is accessible to patient           Stairs      [x] Yes []  No     Ramp [] Yes [] No Comments:  Lives with older brother and sister; 3-4 stairs to enter her house.  Will be having a ramp constructed.   COMMUNITY ADL: TRANSPORTATION: [x] Car    [] Van    [] Public Transportation    [] [employer] w/c Lift    [] Ambulance    [] Other:       [] Sits in wheelchair during transport  Employment/School:  ????? Specific requirements pertaining to mobility ?????  Other: Car and small SUV - will need to purchase an exterior platform lift to attach to a car.  Has to follow up with her pulmonologist every 3 months in clinic    FUNCTIONAL/SENSORY PROCESSING SKILLS:  Handedness:   [x] Right     [] Left    [] NA  Comments:  ?????  Functional Processing Skills for Wheeled Mobility [x] Processing Skills are adequate for safe wheelchair operation  Areas of concern than may interfere with safe operation of wheelchair Description of problem   []  Attention to environment      [] Judgment      []  Hearing  []  Vision or visual processing      [] Motor Planning  []  Fluctuations in Behavior  ?????    VERBAL COMMUNICATION: [x] WFL receptive [x]  WFL expressive [] Understandable  [] Difficult to understand  [x] non-communicative []  Uses an augmented communication device  CURRENT SEATING / MOBILITY: Current Mobility Base:  [] None [] Dependent [x] Manual [] Scooter [] Power  Type of Control: ?????  Manufacturer:  DriveSize:  20 x Neurosurgeon: acquired 2 years ago, after hip surgery  Current Condition of Mobility Base:  good   Current Wheelchair components:  Vinyl back and vinyl seat sling, leg rests; no cushion  Describe posture in present seating system:  posterior pelvic tilt, kyphosis and increased rounded shoulders.  Increased difficulty breathing due to more flexed trunk posture.  Wheelchair is too wide to fit through household doorways      SENSATION and SKIN ISSUES: Sensation [] Intact  [x] Impaired [] Absent  Level of sensation: peripheral neuropathy distally Pressure Relief: Able to perform effective pressure relief :    [x] Yes  []   No Method: ???? If not, Why?: ?????  Skin Issues/Skin Integrity Current Skin Issues  [] Yes [x] No [] Intact []  Red area[]  Open Area  [] Scar Tissue [] At risk from prolonged sitting Where  ?????  History of Skin Issues  [] Yes [x] No Where  ????? When  ?????  Hx of skin flap surgeries   [] Yes [x] No Where  ????? When  ?????  Limited sitting tolerance [] Yes [x] No Hours spent sitting in wheelchair daily: >8 hours out of bed each day; sits in recliner mostly and reclines when she needs to rest  Complaint of Pain:  Please describe: 10/10 low back and hip pain after prolonged standing   Swelling/Edema: mild in LE   ADL STATUS (in reference to wheelchair use):  Indep Assist Unable Indep with Equip Not assessed Comments  Dressing X ????? ????? ????? ????? performs in sitting  Eating X ????? ????? ????? ????? seated in her recliner with tray; does not ambulate to kitchen  Toileting X ????? ????? ????? ????? ?????  Bathing ????? ????? ????? X ????? Has to wear 02 and uses shower chair; UE become very fatigued  Grooming/Hygiene X ????? ????? ????? ????? ?????  Meal Prep ????? X ????? ????? ????? Brother and sister cook; if pt cooks she leans on the counter/sink and has to sit down frequently  IADLS ????? X ????? ????? ????? siblings drop her off at the door; uses Architectural technologist when shopping  Bowel Management: [x] Continent  [] Incontinent  [] Accidents Comments:  ?????  Bladder Management: [x] Continent  [] Incontinent  [] Accidents Comments:  ?????     WHEELCHAIR SKILLS: Manual w/c Propulsion: [] UE or LE strength and endurance sufficient to participate in ADLs using manual wheelchair Arm : [] left [] right   [x] Both      Distance: 10-15 feet and then has to stop due to UE/LE fatigue and difficulty breathing.  Due to patient's weight and weight of chair, sister and brother unable to push pt in manual wheelchair   Foot:  [] left [] right   [x] Both  Operate Scooter: []  Strength, hand grip, balance and transfer appropriate for use [] Living environment is accessible for use of scooter  Operate Power w/c:  [x]  Std. Joystick   []  Alternative Controls Indep [x]  Assist []  Dependent/unable []  N/A []   [x] Safe          [x]  Functional      Distance: community distances  Bed confined  without wheelchair []  Yes [x]  No   STRENGTH/RANGE OF MOTION:  AROM Range of Motion Strength  Shoulder WFL LUE 3/5; RUE 4/5  Elbow WFL 4/5 Biceps bilaterally; 3/5 triceps bilaterally  Wrist/Hand WFL 4/5 LUE, 5/5 RUE  Hip Limited hip flexion bilaterally 2/5 LLE, 3/5 RLE  Knee WFL 3/5 LLE, 4/5 RLE  Ankle WFL 5/5 Bilaterally     MOBILITY/BALANCE:  []  Patient is totally dependent for mobility  ?????    Balance Transfers Ambulation  Sitting Balance: Standing Balance: []  Independent []  Independent/Modified Independent  [x]  WFL     []  WFL [x]  Supervision [x]  Supervision  []  Uses UE for balance  [x]  Supervision []  Min Assist []  Ambulates with Assist  ?????    []  Min Assist []  Min assist []  Mod Assist [x]  Ambulates with Device:      []  RW  []  StW  [x]  Cane  []  ?????  []  Mod Assist []  Mod assist []  Max assist   []  Max Assist []  Max assist []  Dependent []  Indep. Short Distance Only  []  Unable []  Unable []  Lift /  Sling Required Distance (in feet)  10-15 feet maximum   []  Sliding board []  Unable to Ambulate (see explanation below)  Cardio Status:  [] Intact  [x]  Impaired   []  NA     hypertension, aortic atherosclerosis, tachycardia  Respiratory Status:  [] Intact   [x] Impaired   [] NA     severe COPD, asthma, emphysema   Orthotics/Prosthetics: None  Comments (Address manual vs power w/c vs scooter): Patient presents with significantly impaired cardiac and respiratory function as well as severe pain in multiple joints during prolonged standing that limit patient's ability to perform home ambulation and ADLs efficiently and independently.  A cane or walker is not sufficient to provide pt with the necessary support to improve functional endurance.  Patient is only able to ambulate 10-15 feet over level indoor surfaces before needing to sit and rest due to shortness of breath and due to low back and hip pain.   The patient was provided a manual wheelchair after her hip surgery two years ago but she is unable  to manually propel with UE or LE functional distances due to UE weakness and extreme difficulty with breathing.  Pt is only able to propel 10-15 feet over indoor, level surfaces which limits her from being able to travel from room to room.  The manual wheelchair is also too wide to enter her bathroom door.  Patient's family members are unable to push patient inside her home in the manual wheelchair due to size and weight of patient and wheelchair.  The family is also unable to lift the manual wheelchair into a vehicle to utilize in the community due to lifting restrictions after back surgery.   A scooter would not provide pt with sufficient seating support to accommodate patient's postural impairments following multiple surgeries to her back and bilateral hip joints.  Inappropriate seating significantly increases patient's pain in her bilateral hips and low back.  The patient is also at a greater risk for tipping over when utilizing a scooter.  Pt also lacks the endurance in her UE to propel a scooter on a day to day basis. A power wheelchair will provide the patient with greater stability, improved positioning of her UE to reduce fatigue when propelling through her home or community.  The power wheelchair seating system will provide the patient with improved support to decrease pain through her hips and low back.  The motorized function will allow independent mobility throughout her home and community while conserving necessary energy for ADLs. Because a platform lift can be attached to the patient's car, the patient and her family will not be required to lift a manual wheelchair into or out of the car for transport.             Anterior / Posterior Obliquity Rotation-Pelvis bilat THA, multiple lumbar fusions  PELVIS    []  [x]  []   Neutral Posterior Anterior  []  [x]  []   WFL Rt elev Lt elev  []  [x]  []   WFL Right Left                      Anterior    Anterior     []  Fixed []  Other [x]  Partly  Flexible []  Flexible   []  Fixed []  Other [x]  Partly Flexible  []  Flexible  [x]  Fixed []  Other []  Partly Flexible  []  Flexible   TRUNK  []  [x]  []   WFL ? Thoracic ? Lumbar  Kyphosis Lordosis  []  [x]  []   WFL Convex Convex  Right Left [x] c-curve [] s-curve [] multiple  []  Neutral []  Left-anterior [x]  Right-anterior     []  Fixed []  Flexible [x]  Partly Flexible []  Other  []  Fixed []  Flexible [x]  Partly Flexible []  Other  []  Fixed             []  Flexible [x]  Partly Flexible []  Other    Position Windswept  ?????  HIPS          []            []               [x]    Neutral       Abduct        ADduct         [x]           []            []   Neutral Right           Left      []  Fixed []  Subluxed []  Partly Flexible []  Dislocated [x]  Flexible  []  Fixed []  Other []  Partly Flexible  []  Flexible                 Foot Positioning Knee Positioning  R knee positioned slightly forward to the left knee in sitting due to leg length discrepency     [x]  WFL  [] Lt [] Rt []  WFL  [] Lt [x] Rt    KNEES ROM concerns: ROM concerns:    & Dorsi-Flexed [] Lt [] Rt ?????    FEET Plantar Flexed [] Lt [] Rt      Inversion                 [] Lt [] Rt      Eversion                 [] Lt [] Rt     HEAD [x]  Functional [x]  Good Head Control  ?????  & []  Flexed         []  Extended []  Adequate Head Control    NECK []  Rotated  Lt  []  Lat Flexed Lt []  Rotated  Rt []  Lat Flexed Rt []  Limited Head Control     []  Cervical Hyperextension []  Absent  Head Control     SHOULDERS ELBOWS WRIST& HAND ?????      Left     Right    Left     Right    Left     Right   U/E [x] Functional           [x] Functional WFL WFL [] Fisting             [] Fisting      [] elev   [] dep      [] elev   [] dep       [] pro -[] retract     [] pro  [] retract [] subluxed             [] subluxed           Goals for Wheelchair Mobility  [x]  Independence with mobility in the home with motor related ADLs (MRADLs)  [x]  Independence with MRADLs in the community []   Provide dependent mobility  []  Provide recline     [] Provide tilt   Goals for Seating system [x]  Optimize pressure distribution [x]  Provide support needed to facilitate function or safety []  Provide corrective forces to assist with maintaining or improving posture [x]  Accommodate client's posture:   current seated postures and positions are not flexible or will not tolerate corrective forces []  Client to  be independent with relieving pressure in the wheelchair [x] Enhance physiological function such as breathing, swallowing, digestion  Simulation ideas/Equipment trials:N/A State why other equipment was unsuccessful:Patient presents with significantly impaired cardiac and respiratory function as well as severe pain in multiple joints during prolonged standing that limit patient's ability to perform home ambulation and ADLs efficiently and independently.  A cane or walker is not sufficient to provide pt with the necessary support to improve functional endurance.  Patient is only able to ambulate 10-15 feet over level indoor surfaces before needing to sit and rest due to shortness of breath and due to low back and hip pain.   The patient was provided a manual wheelchair after her hip surgery two years ago but she is unable to manually propel with UE or LE functional distances due to UE weakness and extreme difficulty with breathing.  Pt is only able to propel 10-15 feet over indoor, level surfaces which limits her from being able to travel from room to room.  The manual wheelchair is also too wide to enter her bathroom door.  Patient's family members are unable to push patient inside her home in the manual wheelchair due to size and weight of patient and wheelchair.  The family is also unable to lift the manual wheelchair into a vehicle to utilize in the community due to lifting restrictions after back surgery.   A scooter would not provide pt with sufficient seating support to accommodate patient's postural  impairments following multiple surgeries to her back and bilateral hip joints.  Inappropriate seating significantly increases patient's pain in her bilateral hips and low back.  The patient is also at a greater risk for tipping over when utilizing a scooter.  Pt also lacks the endurance in her UE to propel a scooter on a day to day basis. A power wheelchair will provide the patient with greater stability, improved positioning of her UE to reduce fatigue when propelling through her home or community.  The power wheelchair seating system will provide the patient with improved support to decrease pain through her hips and low back.  The motorized function will allow independent mobility throughout her home and community while conserving necessary energy for ADLs. Because a platform lift can be attached to the patient's car, the patient and her family will not be required to lift a manual wheelchair into or out of the car for transport.   MOBILITY BASE RECOMMENDATIONS and JUSTIFICATION: MOBILITY COMPONENT JUSTIFICATION  Manufacturer: Drive Model: Titan LTE   Size: Width 18 Seat Depth 18 [x] provide transport from point A to B      [x] promote Indep mobility  [x] is not a safe, functional ambulator [x] walker or cane inadequate [] non-standard width/depth necessary to accommodate anatomical measurement []  ?????  [] Manual Mobility Base [] non-functional ambulator    [] Scooter/POV  [] can safely operate  [] can safely transfer   [] has adequate trunk stability  [] cannot functionally propel manual w/c  [x] Power Mobility Base  [] non-ambulatory  [x] cannot functionally propel manual wheelchair  [x]  cannot functionally and safely operate scooter/POV [x] can safely operate and willing to  [] Stroller Base [] infant/child  [] unable to propel manual wheelchair [] allows for growth [] non-functional ambulator [] non-functional UE [] Indep mobility is not a goal at this time  [] Tilt  [] Forward [] Backward [] Powered tilt   [] Manual tilt  [] change position against gravitational force on head and shoulders  [] change position for pressure relief/cannot weight shift [] transfers  [] management of tone [] rest periods [] control edema [] facilitate postural control  []  ?????  [] Recline  []   Power recline on power base [] Manual recline on manual base  [] accommodate femur to back angle  [] bring to full recline for ADL care  [] change position for pressure relief/cannot weight shift [] rest periods [] repositioning for transfers or clothing/diaper /catheter changes [] head positioning  [] Lighter weight required [] self- propulsion  [] lifting []  ?????  [] Heavy Duty required [] user weight greater than 250# [] extreme tone/ over active movement [] broken frame on previous chair []  ?????  []  Back  []  Angle Adjustable []  Custom molded ????? [] postural control [] control of tone/spasticity [] accommodation of range of motion [] UE functional control [] accommodation for seating system []  ????? [] provide lateral trunk support [] accommodate deformity [] provide posterior trunk support [] provide lumbar/sacral support [] support trunk in midline [] Pressure relief over spinal processes  []  Seat Cushion ????? [] impaired sensation  [] decubitus ulcers present [] history of pressure ulceration [] prevent pelvic extension [] low maintenance  [] stabilize pelvis  [] accommodate obliquity [] accommodate multiple deformity [] neutralize lower extremity position [] increase pressure distribution []  ?????  []  Pelvic/thigh support  []  Lateral thigh guide []  Distal medial pad  []  Distal lateral pad []  pelvis in neutral [] accommodate pelvis []  position upper legs []  alignment []  accommodate ROM []  decr adduction [] accommodate tone [] removable for transfers [] decr abduction  []  Lateral trunk Supports []  Lt     []  Rt [] decrease lateral trunk leaning [] control tone [] contour for increased contact [] safety  [] accommodate asymmetry []  ?????   []  Mounting hardware  [] lateral trunk supports  [] back   [] seat [] headrest      []  thigh support [] fixed   [] swing away [] attach seat platform/cushion to w/c frame [] attach back cushion to w/c frame [] mount postural supports [] mount headrest  [] swing medial thigh support away [] swing lateral supports away for transfers  []  ?????    Armrests  [] fixed [x] adjustable height [] removable   [] swing away  [x] flip back   [] reclining [x] full length pads [] desk    [] pads tubular  [x] provide support with elbow at 90   [] provide support for w/c tray [x] change of height/angles for variable activities [x] remove for transfers [x] allow to come closer to table top [x] remove for access to tables []  ?????  Hangers/ Leg rests  [] 60 [] 70 [] 90 [] elevating [] heavy duty  [] articulating [] fixed [] lift off [] swing away     [] power [] provide LE support  [] accommodate to hamstring tightness [] elevate legs during recline   [] provide change in position for Legs [] Maintain placement of feet on footplate [] durability [] enable transfers [] decrease edema [] Accommodate lower leg length []     Foot support Footplate    [] Lt  []  Rt  [x]  Center mount [x] flip up     [] depth/angle adjustable [] Amputee adapter    []  Lt     []  Rt [x] provide foot support [] accommodate to ankle ROM [x] transfers [] Provide support for residual extremity []  allow foot to go under wheelchair base []  decrease tone  []  ?????  []  Ankle strap/heel loops [] support foot on foot support [] decrease extraneous movement [] provide input to heel  [] protect foot  Tires: [] pneumatic  [x] flat free inserts  [] solid  [x] decrease maintenance  [x] prevent frequent flats [] increase shock absorbency [] decrease pain from road shock [] decrease spasms from road shock []  ?????  []  Headrest  [] provide posterior head support [] provide posterior neck support [] provide lateral head support [] provide anterior head support [] support during tilt and  recline [] improve feeding   [] improve respiration [] placement of switches [] safety  [] accommodate ROM  [] accommodate tone [] improve visual orientation  []  Anterior chest strap []  Vest []  Shoulder retractors  [] decrease forward movement of shoulder [] accommodation of TLSO []   decrease forward movement of trunk [] decrease shoulder elevation [] added abdominal support [] alignment [] assistance with shoulder control  []  ?????  Pelvic Positioner [x] Belt [] SubASIS bar [] Dual Pull [] stabilize tone [x] decrease falling out of chair/ **will not Decr potential for sliding due to pelvic tilting [] prevent excessive rotation [] pad for protection over boney prominence [] prominence comfort [] special pull angle to control rotation []  ?????  Upper Extremity Support [] L   []  R [] Arm trough    [] hand support []  tray       [] full tray [] swivel mount [] decrease edema      [] decrease subluxation   [] control tone   [] placement for AAC/Computer/EADL [] decrease gravitational pull on shoulders [] provide midline positioning [] provide support to increase UE function [] provide hand support in natural position [] provide work surface   POWER WHEELCHAIR CONTROLS  [x] Proportional  [] Non-Proportional Type joystick [] Left  [x] Right [x] provides access for controlling wheelchair   [] lacks motor control to operate proportional drive control [] unable to understand proportional controls  Actuator Control Module  [] Single  [] Multiple   [] Allow the client to operate the power seat function(s) through the joystick control   [] Safety Reset Switches [] Used to change modes and stop the wheelchair when driving in latch mode    [] Upgraded Electronics   [] programming for accurate control [] progressive Disease/changing condition [] non-proportional drive control needed [] Needed in order to operate power seat functions through joystick control   [] Display box [] Allows user to see in which mode and drive the wheelchair  is set  [] necessary for alternate controls    [] Digital interface electronics [] Allows w/c to operate when using alternative drive controls  [] ASL Head Array [] Allows client to operate wheelchair  through switches placed in tri-panel headrest  [] Sip and puff with tubing kit [] needed to operate sip and puff drive controls  [] Upgraded tracking electronics [] increase safety when driving [] correct tracking when on uneven surfaces  [x] Mount for switches or joystick [x] Attaches switches to w/c  [x] Swing away for access or transfers [] midline for optimal placement [] provides for consistent access  [] Attendant controlled joystick plus mount [] safety [] long distance driving [] operation of seat functions [] compliance with transportation regulations []  ?????    Rear wheel placement/Axle adjustability [] None [] semi adjustable [] fully adjustable  [] improved UE access to wheels [] improved stability [] changing angle in space for improvement of postural stability [] 1-arm drive access [] amputee pad placement []  ?????  Wheel rims/ hand rims  [] metal  [] plastic coated [] oblique projections [] vertical projections [] Provide ability to propel manual wheelchair  []  Increase self-propulsion with hand weakness/decreased grasp  Push handles [] extended  [] angle adjustable  [] standard [] caregiver access [] caregiver assist [] allows "hooking" to enable increased ability to perform ADLs or maintain balance  One armed device  [] Lt   [] Rt [] enable propulsion of manual wheelchair with one arm   []  ?????   Brake/wheel lock extension []  Lt   []  Rt [] increase indep in applying wheel locks   [] Side guards [] prevent clothing getting caught in wheel or becoming soiled []  prevent skin tears/abrasions  Battery: 12 volt x 2  [x] to power wheelchair ?????  Other: Oxygen Tank Holder to safely secure portable 02 tank for transportation ?????  The above equipment has a life- long use expectancy. Growth and changes in medical  and/or functional conditions would be the exceptions. This is to certify that the therapist has no financial relationship with durable medical provider or manufacturer. The therapist will not receive remuneration of any kind for the equipment recommended in this evaluation.   Patient has mobility limitation that significantly impairs safe,  timely participation in one or more mobility related ADL's.  (bathing, toileting, feeding, dressing, grooming, moving from room to room)                                                             [x]  Yes []  No Will mobility device sufficiently improve ability to participate and/or be aided in participation of MRADL's?         [x]  Yes []  No Can limitation be compensated for with use of a cane or walker?                                                                                []  Yes [x]  No Does patient or caregiver demonstrate ability/potential ability & willingness to safely use the mobility device?   [x]  Yes []  No Does patient's home environment support use of recommended mobility device?                                                    [x]  Yes []  No Does patient have sufficient upper extremity function necessary to functionally propel a manual wheelchair?    []  Yes [x]  No Does patient have sufficient strength and trunk stability to safely operate a POV (scooter)?                                  []  Yes [x]  No Does patient need additional features/benefits provided by a power wheelchair for MRADL's in the home?       [x]  Yes []  No Does the patient demonstrate the ability to safely use a power wheelchair?                                                              [x]  Yes []  No  Therapist Name Printed: Rico Junker, PT, DPT Date: 07/02/2018  Therapist's Signature:   Date:   Supplier's Name Printed: Luz Bra[employer], [employer] Date: 07/02/2018  Supplier's Signature:   Date:  Patient/Caregiver Signature:   Date:     This is to certify that I have read this  evaluation and do agree with the content within:      Physician's Name Printed: [name], MD  53 Signature:  Date:     This is to certify that I, the above signed therapist have the following affiliations: []  This DME provider []  Manufacturer of recommended equipment []  Patient's long term care facility [x]  None of the above              Objective measurements completed on examination:  See above findings.              PT Education - 07/02/18 1329    Education Details  process for obtaining a power w/c    Person(s) Educated  Patient;Other (comment)   sister   Methods  Explanation    Comprehension  Verbalized understanding              Plan - 07/02/18 1330    Clinical Impression Statement  Pt is a 58 y.o. female referred to physical therapy for motorized wheelchair evaluation due to severe COPD.  Wheelchair evaluation was completed at today's evaluation via telehealth with [employer] present in patient's home.  Paperwork completed and provided to wheelchair vendor from [employer]  Face to face evaluation with MD to follow for further sign off for power mobility.  Copy of PT letter of medical necessity available in EPIC.    PT Frequency  One time visit    PT Duration  Other (comment)   one visit for w/c eval only   PT Treatment/Interventions  Other (comment)   wheelchair management   Consulted and Agree with Plan of Care  Patient;Family member/caregiver    Family Member Consulted  sister       Patient will benefit from skilled therapeutic intervention in order to improve the following deficits and impairments:  Cardiopulmonary status limiting activity, Decreased activity tolerance, Decreased balance, Decreased endurance, Decreased mobility, Decreased strength, Difficulty walking, Pain  Visit Diagnosis: Muscle weakness (generalized) - Plan: PT plan of care cert/re-cert  Difficulty in walking, not elsewhere classified - Plan: PT  plan of care cert/re-cert  Pain in left hip - Plan: PT plan of care cert/re-cert  Pain in right hip - Plan: PT plan of care cert/re-cert  Chronic midline low back pain without sciatica - Plan: PT plan of care cert/re-cert     Problem List Patient Active Problem List   Diagnosis Date Noted  . Acute on chronic respiratory failure with hypoxia ([place]) 05/21/2018  . Cough 03/25/2018  . Tobacco abuse 01/15/2018  . Hypomagnesemia 12/29/2017  . COPD exacerbation ([place]) 12/28/2017  . Insomnia 12/11/2017  . Pulmonary nodule 11/11/2017  . Carotid artery disease ([place]) 11/05/2017  . Hoarseness 09/09/2017  . OSA on CPAP 08/07/2017  . Family history of heart disease 07/18/2017  . GERD without esophagitis 06/23/2017  . H/O total hip arthroplasty, left 06/18/2017  . Hypothyroidism due to acquired atrophy of thyroid 04/05/2017  . Primary osteoarthritis involving multiple joints 04/05/2017  . Incarcerated ventral hernia 04/04/2017  . Chronic constipation 02/10/2017  . Iron deficiency anemia due to dietary causes 02/10/2017  . Osteoarthritis 02/10/2017  . Dyslipidemia 02/10/2017  . H/O total hip arthroplasty, right 02/05/2017  . Rash 01/08/2017  . Pre-operative clearance 12/16/2016  . Chronic respiratory failure with hypoxia ([place]) 12/16/2016  . Essential hypertension 11/29/2016  . Back pain 11/29/2016  . Vocal cord dysfunction   . COPD, severe ([place]) 09/24/2016  . Anxiety 09/24/2016  . Steroid-induced diabetes ([place]) 09/24/2016    Rico Junker, PT, DPT 07/02/18    4:12 PM    [facility] [address] [place], [place], [address] Phone: [phone]   Fax:  [phone]  Name: Joan [name] MRN: [id number] Date of Birth: Apr 19, 1960

## 2018-07-02 NOTE — Telephone Encounter (Signed)
[facility] line busy x 2 

## 2018-07-03 NOTE — Telephone Encounter (Signed)
If she calls back, can you please ask Joan [name] if she is bringing her information back for the patient assistance for her meds? I have her forms with me.

## 2018-07-03 NOTE — Telephone Encounter (Signed)
[facility] again line rang busy.

## 2018-07-07 NOTE — Telephone Encounter (Signed)
Spoke with pt, she states her medication will not be covered due to it not being medically necessary/ I faxed her paperwork to [employer] and [employer].

## 2018-07-07 NOTE — Telephone Encounter (Signed)
Pt is returning call. Per pt, her insurance company stated that they are denying medication. Per pt her phone will not accept our phone number and will need to be called from a private number or she can use MyChart. Cb is [phone].

## 2018-07-07 NOTE — Telephone Encounter (Signed)
Tried to call patient this morning, phone had busy signal X2.

## 2018-07-09 ENCOUNTER — Telehealth: Payer: Self-pay

## 2018-07-09 ENCOUNTER — Other Ambulatory Visit: Payer: Self-pay | Admitting: Internal Medicine

## 2018-07-09 DIAGNOSIS — J9611 Chronic respiratory failure with hypoxia: Secondary | ICD-10-CM | POA: Diagnosis not present

## 2018-07-09 DIAGNOSIS — M79606 Pain in leg, unspecified: Secondary | ICD-10-CM

## 2018-07-09 DIAGNOSIS — J441 Chronic obstructive pulmonary disease with (acute) exacerbation: Secondary | ICD-10-CM | POA: Diagnosis not present

## 2018-07-09 DIAGNOSIS — J449 Chronic obstructive pulmonary disease, unspecified: Secondary | ICD-10-CM | POA: Diagnosis not present

## 2018-07-09 NOTE — Telephone Encounter (Signed)
Copied from [place] [id number]. Topic: Referral - Request for Referral >> Jul 09, 2018 12:42 PM Sheran Luz wrote: Has patient seen PCP for this complaint? Yes  Referral for which specialty: Ortho Preferred provider/office: [facility]. [facility], [place] Reason for referral: Leg pain

## 2018-07-10 ENCOUNTER — Other Ambulatory Visit: Payer: Self-pay

## 2018-07-10 MED ORDER — CYCLOBENZAPRINE HCL 10 MG PO TABS: 10.0000 mg | ORAL_TABLET | Freq: Every day | ORAL | 1 refills | Status: DC

## 2018-07-10 MED ORDER — NAPROXEN 500 MG PO TABS: 500.0000 mg | ORAL_TABLET | Freq: Two times a day (BID) | ORAL | 1 refills | Status: DC

## 2018-07-10 NOTE — Telephone Encounter (Signed)
Patient informed and refills sent of her meds as [employer] states they did not have a refill on file

## 2018-07-10 NOTE — Telephone Encounter (Signed)
Has she already seen them in the past.

## 2018-07-10 NOTE — Telephone Encounter (Signed)
Patient states that she has changes insurances since the surgery with them and needs a new referral

## 2018-07-10 NOTE — Telephone Encounter (Signed)
Referral done

## 2018-07-10 NOTE — Addendum Note (Signed)
Addended by: Raford Pitcher R on: 07/10/2018 02:29 PM   Modules accepted: Orders

## 2018-07-10 NOTE — Addendum Note (Signed)
Addended by: Pricilla Holm A on: 07/10/2018 02:11 PM   Modules accepted: Orders

## 2018-07-10 NOTE — Telephone Encounter (Signed)
Quentin Ore, CMA has initiated steps for patient assistance.  Will close this encounter.

## 2018-07-10 NOTE — Patient Outreach (Signed)
[facility]) Care Management  07/10/2018  Joan [name] 1960-03-25 [id number]   Medication Adherence call to Joan [name] patients telephone is busy all the time not sure if it's not working. Joan [name] is showing past due on Metformin 500 mg under [place].   [facility] Direct Dial [phone]  Fax [phone] [email or web address]

## 2018-07-14 DIAGNOSIS — Z96643 Presence of artificial hip joint, bilateral: Secondary | ICD-10-CM | POA: Diagnosis not present

## 2018-07-14 DIAGNOSIS — Z471 Aftercare following joint replacement surgery: Secondary | ICD-10-CM | POA: Diagnosis not present

## 2018-07-14 DIAGNOSIS — M25552 Pain in left hip: Secondary | ICD-10-CM | POA: Diagnosis not present

## 2018-07-14 DIAGNOSIS — Z96641 Presence of right artificial hip joint: Secondary | ICD-10-CM | POA: Diagnosis not present

## 2018-07-16 ENCOUNTER — Encounter: Payer: Self-pay | Admitting: Internal Medicine

## 2018-07-16 DIAGNOSIS — J449 Chronic obstructive pulmonary disease, unspecified: Secondary | ICD-10-CM | POA: Diagnosis not present

## 2018-07-16 DIAGNOSIS — J441 Chronic obstructive pulmonary disease with (acute) exacerbation: Secondary | ICD-10-CM

## 2018-07-16 DIAGNOSIS — J9611 Chronic respiratory failure with hypoxia: Secondary | ICD-10-CM | POA: Diagnosis not present

## 2018-07-16 DIAGNOSIS — G4733 Obstructive sleep apnea (adult) (pediatric): Secondary | ICD-10-CM | POA: Diagnosis not present

## 2018-07-17 DIAGNOSIS — G4733 Obstructive sleep apnea (adult) (pediatric): Secondary | ICD-10-CM | POA: Diagnosis not present

## 2018-07-17 DIAGNOSIS — J449 Chronic obstructive pulmonary disease, unspecified: Secondary | ICD-10-CM | POA: Diagnosis not present

## 2018-07-17 DIAGNOSIS — J441 Chronic obstructive pulmonary disease with (acute) exacerbation: Secondary | ICD-10-CM | POA: Diagnosis not present

## 2018-07-17 DIAGNOSIS — J9611 Chronic respiratory failure with hypoxia: Secondary | ICD-10-CM | POA: Diagnosis not present

## 2018-07-22 DIAGNOSIS — J441 Chronic obstructive pulmonary disease with (acute) exacerbation: Secondary | ICD-10-CM | POA: Diagnosis not present

## 2018-07-22 DIAGNOSIS — J449 Chronic obstructive pulmonary disease, unspecified: Secondary | ICD-10-CM | POA: Diagnosis not present

## 2018-07-22 DIAGNOSIS — H538 Other visual disturbances: Secondary | ICD-10-CM | POA: Diagnosis not present

## 2018-07-22 DIAGNOSIS — E1165 Type 2 diabetes mellitus with hyperglycemia: Secondary | ICD-10-CM | POA: Diagnosis not present

## 2018-07-22 DIAGNOSIS — G4733 Obstructive sleep apnea (adult) (pediatric): Secondary | ICD-10-CM | POA: Diagnosis not present

## 2018-07-22 DIAGNOSIS — J9611 Chronic respiratory failure with hypoxia: Secondary | ICD-10-CM | POA: Diagnosis not present

## 2018-07-22 DIAGNOSIS — H2510 Age-related nuclear cataract, unspecified eye: Secondary | ICD-10-CM | POA: Diagnosis not present

## 2018-07-23 ENCOUNTER — Telehealth: Payer: Self-pay

## 2018-07-23 DIAGNOSIS — Z1211 Encounter for screening for malignant neoplasm of colon: Secondary | ICD-10-CM

## 2018-07-23 NOTE — Telephone Encounter (Signed)
Was patient supposed to do cologuard or get a referral for colonoscopy? I do not see any one that gave her a call regarding either

## 2018-07-23 NOTE — Telephone Encounter (Signed)
She sees Dr. Loletha Carrow in GI, perhaps she should call their office.

## 2018-07-23 NOTE — Telephone Encounter (Signed)
Copied from [place] [id number]. Topic: Referral - Question >> Jul 23, 2018  1:35 PM Vernona Rieger wrote: Reason for CRM: patient said she received a call about a cologuard but thought she was due for a colonoscopy instead. Please advise.  Prefers female and someone in the GI department in the same building.

## 2018-07-24 NOTE — Addendum Note (Signed)
Addended by: Pricilla Holm A on: 07/24/2018 09:58 AM   Modules accepted: Orders

## 2018-07-24 NOTE — Telephone Encounter (Signed)
Referral placed.

## 2018-07-28 ENCOUNTER — Telehealth: Payer: Self-pay | Admitting: Gastroenterology

## 2018-07-28 ENCOUNTER — Encounter: Payer: Self-pay | Admitting: Gastroenterology

## 2018-07-28 NOTE — Telephone Encounter (Signed)
Dr. Loletha Carrow, there is a referral for this pt to have a colonoscopy.  She would prefer to schedule with a female MD.  Will you approve the switch?

## 2018-07-28 NOTE — Telephone Encounter (Signed)
Dr. Tarri Glenn, please see below.  Will you accept this pt?

## 2018-07-28 NOTE — Telephone Encounter (Signed)
Of course, since that is the patient's preference.  - HD

## 2018-07-28 NOTE — Telephone Encounter (Signed)
I would be happy to perform her colonoscopy. Thank you.

## 2018-08-04 DIAGNOSIS — Z471 Aftercare following joint replacement surgery: Secondary | ICD-10-CM | POA: Diagnosis not present

## 2018-08-04 DIAGNOSIS — M25552 Pain in left hip: Secondary | ICD-10-CM | POA: Diagnosis not present

## 2018-08-04 DIAGNOSIS — Z96643 Presence of artificial hip joint, bilateral: Secondary | ICD-10-CM | POA: Diagnosis not present

## 2018-08-05 ENCOUNTER — Ambulatory Visit (INDEPENDENT_AMBULATORY_CARE_PROVIDER_SITE_OTHER): Payer: Medicare Other | Admitting: Licensed Clinical Social Worker

## 2018-08-05 DIAGNOSIS — F4312 Post-traumatic stress disorder, chronic: Secondary | ICD-10-CM | POA: Diagnosis not present
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
## 2018-08-09 DIAGNOSIS — J441 Chronic obstructive pulmonary disease with (acute) exacerbation: Secondary | ICD-10-CM | POA: Diagnosis not present

## 2018-08-09 DIAGNOSIS — J449 Chronic obstructive pulmonary disease, unspecified: Secondary | ICD-10-CM | POA: Diagnosis not present

## 2018-08-09 DIAGNOSIS — J9611 Chronic respiratory failure with hypoxia: Secondary | ICD-10-CM | POA: Diagnosis not present

## 2018-08-12 ENCOUNTER — Telehealth: Payer: Self-pay | Admitting: *Deleted

## 2018-08-12 NOTE — Telephone Encounter (Signed)
Virtual office visit sounds appropriate. Thanks.

## 2018-08-12 NOTE — Telephone Encounter (Signed)
Pt.reports that she uses oxygen @ 2L as needed during day and if she is active she uses 3 L.the patient. Wears cpap with 2L nightly.pt. was seen by Dr.Danis on 01/01/18. She also reports that she had egd and colonoscopy in [place] 5-7 years ago and they did not find anything.do you want to have office visit with pt ? Please advise.

## 2018-08-14 ENCOUNTER — Telehealth: Payer: Self-pay | Admitting: *Deleted

## 2018-08-14 ENCOUNTER — Other Ambulatory Visit: Payer: Self-pay

## 2018-08-14 NOTE — Telephone Encounter (Signed)
Called the patient to schedule an office visit as requested by Dr Tarri Glenn.Patient is on Home 02.  Patient requesting virtual visit. This Probation officer unable to schedule virtual visit. Call placed earlier to scheduler regarding need for virtual visit. Patient will call scheduler.

## 2018-08-14 NOTE — Telephone Encounter (Signed)
Called Joan [name] to schedule her for a virtual visit with Dr Tarri Glenn. No answer so I left her a detailed message to call back.

## 2018-08-14 NOTE — Patient Outreach (Signed)
[facility]) Care Management  08/14/2018  Joan [name] April 10, 1960 [id number]   Medication Adherence call to Mrs. Joan [name] Telephone call to Patient regarding Medication Adherence unable to reach patient. Joan [name] is showing past due on Metformin 500 mg under [place].   [facility] Management Direct Dial [phone]  Fax [phone] [email or web address]

## 2018-08-17 ENCOUNTER — Telehealth: Payer: Self-pay

## 2018-08-17 DIAGNOSIS — Z471 Aftercare following joint replacement surgery: Secondary | ICD-10-CM | POA: Diagnosis not present

## 2018-08-17 DIAGNOSIS — Z96642 Presence of left artificial hip joint: Secondary | ICD-10-CM | POA: Diagnosis not present

## 2018-08-17 NOTE — Telephone Encounter (Signed)
Patient was called to schedule a virtual  OV with Dr. Tarri Glenn. Patient is on Home O2 and Dr. Tarri Glenn wants to have an OV prior to scheduling her at the hospital for a colonoscopy. No answer. Left message to call so we could schedule the appointment. If patient does not call I will try to call her again later today.   Riki Sheer, LPN ( [facility] )

## 2018-08-18 ENCOUNTER — Telehealth: Payer: Self-pay | Admitting: Acute Care

## 2018-08-18 ENCOUNTER — Ambulatory Visit (INDEPENDENT_AMBULATORY_CARE_PROVIDER_SITE_OTHER): Payer: Medicare Other | Admitting: Licensed Clinical Social Worker

## 2018-08-18 DIAGNOSIS — Z9989 Dependence on other enabling machines and devices: Secondary | ICD-10-CM

## 2018-08-18 DIAGNOSIS — F4312 Post-traumatic stress disorder, chronic: Secondary | ICD-10-CM | POA: Diagnosis not present

## 2018-08-18 DIAGNOSIS — J441 Chronic obstructive pulmonary disease with (acute) exacerbation: Secondary | ICD-10-CM

## 2018-08-18 DIAGNOSIS — G4733 Obstructive sleep apnea (adult) (pediatric): Secondary | ICD-10-CM

## 2018-08-18 NOTE — Telephone Encounter (Signed)
Spoke with patient. She stated that she will be travelling up [place] in a few months to attend her son's wedding. She wants to know if she could have an order to be sent to [employer] for her to receive the following: a travel sized nebulizer, travel sized cpap and POC.   I advised her that normally insurance will only pay for 1 CPAP machine and patients who have the travel CPAP have had to pay for it out of pocket. She still wants [place] to send the order to see if insurance will attempt to pay for it.   SG, please advise if you are ok with these orders. Thanks!

## 2018-08-18 NOTE — Progress Notes (Signed)
Patient ID: Marcena C Hamelin, female   DOB: 04/06/1960, 58 y.o.   MRN: [id number]  Virtual Visit via Video Note  I connected with Achol C Fusaro on 08/18/18 at 10:00 AM EDT by a video enabled telemedicine application and verified that I am speaking with the correct person using two identifiers.   I discussed the limitations of evaluation and management by telemedicine and the availability of in person appointments. The patient expressed understanding and agreed to proceed.   I discussed the assessment and treatment plan with the patient. The patient was provided an opportunity to ask questions and all were answered. The patient agreed with the plan and demonstrated an understanding of the instructions.   The patient was advised to call back or seek an in-person evaluation if the symptoms worsen or if the condition fails to improve as anticipated.  Type of Therapy: Individual Therapy  Treatment Goals addressed:  Develop and implement effective coping skills to carry out normal responsibilities and participate constructively in relationships.   Interventions:  Reality Therapy, Supportive Counseling  Summary: Maisey is a 58 y.o. female who presents with symptoms of paranoia, hypervigilance, difficulty concentrating, irritability, feelings of worthlessness  Therapist Response:  Patient met with clinician for an individual session. She reports that she has been trying to "not fly off the handle as much" lately, and she finds herself easily angered. Counselor guided patient to discuss the ways she has been calming herself. Patient indicates that in the past when she has been upset around her brother he has helped her to look at things through others' eyes, so she has been trying to do this on her own when she thinks something said or done has been negative toward her. Counselor commended patient's attempts to take perspective, and emphasized that patient has the right and ability to make choices about how  she acts. Counselor explored with patient situations that induce her anger. Patient reports staying away from people most of the time so that she does not get caught up in paranoia. She indicates that she will be attending her son's wedding in [place] in early August, and that she will have to remember to not get upset at things that are said there, by her adult children. In discussing past arguments with her children, patient shared about their childhood. Counselor processed with patient her experience with her children being placed into foster care and then being separated (youngest daughter was adopted, son was taken in by their father and oldest daughter left in foster care). Counselor guided patient to consider the perspective of each of them based on their backgrounds. Patient was able to explore ways that their experiences and perspectives may influence their current relationships with her. Counselor encouraged patient to choose her responses to her children with sensitivity to these experiences, and also with thought toward nurturing a healthy relationship now.   Suicidal/Homicidal:  No  Recommendation and plan:  Counseling sessions every 2 weeks.  Diagnosis:  Post traumatic Stress Disorder  I provided 52 minutes of non-face-to-face time during this encounter.   Regina Alexander, LCSW   

## 2018-08-19 ENCOUNTER — Ambulatory Visit (HOSPITAL_COMMUNITY): Payer: Self-pay | Admitting: Licensed Clinical Social Worker

## 2018-08-19 NOTE — Telephone Encounter (Signed)
It is fine to place the order, but it will be refused. But go ahead and go through the motions. Thanks

## 2018-08-19 NOTE — Telephone Encounter (Signed)
Confirmed with [facility]) all three requests can be made one DME order.  Patient prescription for Albuterol/Proventil was 06/09/18 with refills.  Order placed and forwarded for signature.  Called patient and advised her the order was being placed. She asked if she needed a letter giving permission to travel out of the state by car. I suggested she contact the [place] office to find out what if any travel restrictions have been made and if a letter was required. Advised that if she was told a letter was needed, to contact our office. Patient voiced understanding. Nothing further needed at this time.

## 2018-08-19 NOTE — Addendum Note (Signed)
Addended by: Nena Polio on: 08/19/2018 11:56 AM   Modules accepted: Orders

## 2018-08-24 ENCOUNTER — Ambulatory Visit: Payer: Medicare Other | Admitting: Acute Care

## 2018-08-24 ENCOUNTER — Telehealth: Payer: Self-pay | Admitting: Acute Care

## 2018-08-24 ENCOUNTER — Encounter: Payer: Self-pay | Admitting: Acute Care

## 2018-08-24 ENCOUNTER — Other Ambulatory Visit: Payer: Self-pay

## 2018-08-24 VITALS — BP 104/60 | HR 69 | Temp 98.1°F | Ht 68.0 in | Wt 205.2 lb

## 2018-08-24 DIAGNOSIS — J449 Chronic obstructive pulmonary disease, unspecified: Secondary | ICD-10-CM | POA: Diagnosis not present

## 2018-08-24 DIAGNOSIS — Z720 Tobacco use: Secondary | ICD-10-CM | POA: Diagnosis not present

## 2018-08-24 DIAGNOSIS — J9621 Acute and chronic respiratory failure with hypoxia: Secondary | ICD-10-CM | POA: Diagnosis not present

## 2018-08-24 DIAGNOSIS — R911 Solitary pulmonary nodule: Secondary | ICD-10-CM

## 2018-08-24 DIAGNOSIS — R5381 Other malaise: Secondary | ICD-10-CM

## 2018-08-24 NOTE — Telephone Encounter (Signed)
Patient in office today.  Requests a follow up on financial information for medication assistance with [facility] and also a call for 'over billing' problem with [facility].  Gladstone Pih, NP requests a follow up from both.  Routed to [place] per [employer] request. Nothing further needed

## 2018-08-24 NOTE — Assessment & Plan Note (Signed)
I have spent 5 minutes counseling patient on smoking cessation this visit. Patient verbalizes understanding of their  choice to continue smoking and the negative health consequences including worsening of COPD, risk of lung cancer , stroke and heart disease..    She is smoking 1-1.5 packs per day

## 2018-08-24 NOTE — Patient Instructions (Addendum)
It is good to see you today.  We will have Jonelle Sidle call you about your financial aid information. Continue your Symbicort and Combivent as you have been doing Continue your Singulair and Daliresp as you have been doing. Continue using your rescue nebs prn.  We will have Kathlee Nations, our Billing office call you about over billing. Please follow up with your PCP this week about dizziness and lower blood pressure.( Call for an appointment today)  Please make sure you are using a counter top to prevent falls when you are dizzy. Continue wearing your night time oxygen at 2 L every night, and with exertion. Saturation goals are 88-92% You must quit smoking.  Do not wear oxygen while smoking. Follow up with Dr. Elsworth Soho or Judson Roch NP in 3 months. Please contact office for sooner follow up if symptoms do not improve or worsen or seek emergency care  Note your daily symptoms > remember "red flags" for COPD:  Increase in cough, increase in sputum production, increase in shortness of breath or activity intolerance. If you notice these symptoms, please call to be seen.   Please try to remain active and walk as much as you possibly can. Congratulations on your weight loss.  Keep it up.

## 2018-08-24 NOTE — Progress Notes (Signed)
History of Present Illness Joan [name] is a 58 y.o. female current every day smoker with COPD. She is followed by Dr.    58 y.o.femalecurrent every day smokerwith COPD, steroid induced DMand OSA,bipolar disorder, hypertension, hypothyroidism. She is followed by Dr. Princella [name] CPAP therapy and COPD.She has been hospitalized 12/2017 and 04/2018 with COPD exacerbations.  Maintenance : Spiriva Respimat/ Symbicort Combivent Respimat Albuterol nebs prn   08/24/2018  Follow Up: Pt. Presents for follow up.She had a recent hospitalization 3/25-3/29/2020 for COPD exacerbation, hyperglycemia, stage II CKD, sleep apnea and continued tobacco abuse.She states she has been doing well since.  She is in a wheelchair today as she state she gets too short of breath with walking. She states she has a cough. Secretions are white. She presents today without wearing her oxygen. She continues to smoke every day. She states she has switched insurance and she is having difficulties getting her medications paid for. She has filled out multiple financial aid requests and she is wanting to know what the status is. She states she is compliant with her Symbicort and her Combivent and her Singulair.She has received her albuterol nebs and Daliresp. . She needs her Combivent . Her current insurance will not cover this medication. She states this is not covered by her new insurance. She is very upset about this. She states she has lost 22 pounds. She states she has changed her diet, and she is doing meal replacement with Slim Fast. She is complaining of some dizziness and rapid pulse . She states this comes and goes. She states this has been going on for several months. She is currently smoking 1-1.5 PPD. She understands the health consequences of the continued  smoking. She understands that smoking every day makes it harder for her to breath every day. We discussed use of oxygen while smoking. I explained that there is possibility  of flash burn or tank explosion that can be fatal . She verbalized understanding. She denies fever, chest pain, and orthopnea or hemoptysis.   Test Results: NPSG 02/2017 >> no OSA HST 04/2017 15/h  11/2017 CT Chest  Stable 4 mm right upper lobe nodule and 2 mm left upper lobe nodule. A previously demonstrated 3 mm left upper lobe nodule is no longer seen. No follow-up needed if patient is low-risk (and has no known or suspected primary neoplasm). Non-contrast chest CT can be considered in 12 months if patient is high-risk. This recommendation follows the consensus statement: Guidelines for Management of Incidental Pulmonary Nodules Detected on CT Images: From the Fleischner Society 2017; Radiology 2017; 284:228-243. Changes of COPD with centrilobular emphysema. Calcific coronary artery and aortic atherosclerosis.  10/2016 PFTs showed ratio 49, FEV1 of 36%, FVC 58% and DLCO of 34% consistent with severe obstruction.  11/2016 CT chestbullous changes BL, 15mRUl &2 mm LULnodules >>stable 11/2017  09/2016 echo normal.  CBC Latest Ref Rng & Units 05/24/2018 05/23/2018 05/22/2018  WBC 4.0 - 10.5 K/uL 17.6(H) 17.7(H) 21.3(H)  Hemoglobin 12.0 - 15.0 g/dL 12.4 12.9 12.7  Hematocrit 36.0 - 46.0 % 39.8 40.5 38.8  Platelets 150 - 400 K/uL 308 328 306    BMP Latest Ref Rng & Units 05/24/2018 05/22/2018 05/21/2018  Glucose 70 - 99 mg/dL 113(H) 251(H) 322(H)  BUN 6 - 20 mg/dL 27(H) 22(H) 14  Creatinine 0.44 - 1.00 mg/dL 1.14(H) 1.20(H) 1.28(H)  Sodium 135 - 145 mmol/L 140 132(L) 132(L)  Potassium 3.5 - 5.1 mmol/L 3.9 4.3 3.3(L)  Chloride 98 - 111  mmol/L 105 95(L) 91(L)  CO2 22 - 32 mmol/L 24 25 25   Calcium 8.9 - 10.3 mg/dL 9.2 9.4 9.1    BNP    Component Value Date/Time   BNP 114.5 (H) 12/29/2017 0609    ProBNP No results found for: PROBNP  PFT    Component Value Date/Time   FEV1PRE 1.05 10/30/2016 0847   FEV1POST 1.23 10/30/2016 0847   FVCPRE 2.16 10/30/2016 0847   FVCPOST  2.53 10/30/2016 0847   TLC 5.36 10/30/2016 0847   DLCOUNC 9.40 10/30/2016 0847   PREFEV1FVCRT 49 10/30/2016 0847   PSTFEV1FVCRT 49 10/30/2016 0847    No results found.   Past medical hx Past Medical History:  Diagnosis Date   Acute respiratory failure with hypoxia ([facility])    Alcoholism and drug addiction in family    Anxiety    Arthritis    "knees, hips, lower back" (09/25/2016)   Asthma    Bipolar disorder ([place])    Chronic bronchitis ([place])    "all the time" (09/25/2016)   Chronic leg pain    RLE   Chronic lower back pain    COPD (chronic obstructive pulmonary disease) ([facility])    Depression    Emphysema of lung ([facility])    GERD (gastroesophageal reflux disease)    Headache    Hepatitis C    "treated back in 2001-2002"   High cholesterol    Hypertension    Hypothyroidism    Incarcerated ventral hernia 04/04/2017   On home oxygen therapy    "2L; at nighttime" (04/04/2017)   OSA on CPAP    Pneumonia    "all the time" (09/25/2016)   Positive PPD    with negative chest x ray   Tachycardia    patient reports with exertion ; denies any other conditions    Thyroid disease      Social History   Tobacco Use   Smoking status: Current Every Day Smoker    Packs/day: 1.00    Years: 44.00    Pack years: 44.00    Types: Cigarettes   Smokeless tobacco: Never Used   Tobacco comment: Smoker 1-1.5 PPD every day  Substance Use Topics   Alcohol use: Yes   Drug use: Yes    Types: "Crack" cocaine    Comment: 04/04/2017 "nothing since 01/12/2012"    Joan [name] reports that she has been smoking cigarettes. She has a 44.00 pack-year smoking history. She has never used smokeless tobacco. She reports current alcohol use. She reports current drug use. Drug: "Crack" cocaine.  Tobacco Cessation: Smokes 1-1.5 PPD Every day. She has at least a 44 pack year smoking history  Past surgical hx, Family hx, Social hx all reviewed.  Current Outpatient Medications on File  Prior to Visit  Medication Sig   acyclovir (ZOVIRAX) 400 MG tablet Take 1 tablet (400 mg total) by mouth 2 (two) times daily.   albuterol (PROVENTIL) (2.5 MG/3ML) 0.083% nebulizer solution Take 3 mLs (2.5 mg total) by nebulization 2 (two) times daily. And every 6 hours as needed.  DX: J44.9   atenolol (TENORMIN) 25 MG tablet Take 1 tablet (25 mg total) by mouth daily.   atorvastatin (LIPITOR) 20 MG tablet Take 1 tablet (20 mg total) by mouth daily.   baclofen (LIORESAL) 10 MG tablet Take 10 mg by mouth at bedtime.   budesonide-formoterol (SYMBICORT) 160-4.5 MCG/ACT inhaler Inhale 2 puffs into the lungs 2 (two) times daily.   buPROPion (WELLBUTRIN XL) 150 MG 24 hr tablet Take 1  tablet (150 mg total) by mouth daily.   cyclobenzaprine (FLEXERIL) 10 MG tablet Take 1 tablet (10 mg total) by mouth at bedtime.   diphenhydrAMINE (BENADRYL) 25 MG tablet Take 25 mg by mouth 2 (two) times daily.    gabapentin (NEURONTIN) 300 MG capsule Take 1 capsule (300 mg total) by mouth 2 (two) times daily.   glucose blood (ONE TOUCH ULTRA TEST) test strip Use to check blood sugars twice a day   hydrochlorothiazide (HYDRODIURIL) 25 MG tablet Take 1 tablet (25 mg total) by mouth daily.   Ipratropium-Albuterol (COMBIVENT RESPIMAT) 20-100 MCG/ACT AERS respimat Inhale 1 puff into the lungs every 6 (six) hours as needed for wheezing. (Patient taking differently: Inhale 1 puff into the lungs every 6 (six) hours. )   levofloxacin (LEVAQUIN) 500 MG tablet Take 1 tablet (500 mg total) by mouth daily.   levothyroxine (SYNTHROID, LEVOTHROID) 25 MCG tablet Take 1 tablet (25 mcg total) by mouth daily before breakfast.   montelukast (SINGULAIR) 10 MG tablet Take 1 tablet (10 mg total) by mouth at bedtime.   naproxen (NAPROSYN) 500 MG tablet Take 1 tablet (500 mg total) by mouth 2 (two) times daily with a meal.   nystatin-triamcinolone ointment (MYCOLOG) Apply 1 application topically 2 (two) times daily as needed  (applied to skin folds/groins/under breast skin irritation rash.).   OXYGEN Inhale 2 L into the lungs daily as needed (strenous activity).    pantoprazole (PROTONIX) 40 MG tablet Take 1 tablet (40 mg total) by mouth daily.   Roflumilast (DALIRESP) 250 MCG TABS Take 500 mcg by mouth daily.   roflumilast (DALIRESP) 500 MCG TABS tablet Take 1 tablet (500 mcg total) by mouth daily.   SYMBICORT 160-4.5 MCG/ACT inhaler Inhale 2 puffs into the lungs 2 (two) times daily.   traZODone (DESYREL) 100 MG tablet Take 1 tablet (100 mg total) by mouth at bedtime.   venlafaxine XR (EFFEXOR-XR) 150 MG 24 hr capsule TAKE 1 CAPSULE DAILY WITH  BREAKFAST (Patient taking differently: Take 150 mg by mouth daily with breakfast. )   Blood Glucose Monitoring Suppl (ONE TOUCH ULTRA 2) w/Device KIT Use to check blood sugars twice a day (Patient not taking: Reported on 08/24/2018)   Lancets (ONETOUCH ULTRASOFT) lancets Use to check blood sugars twice a day (Patient not taking: Reported on 08/24/2018)   No current facility-administered medications on file prior to visit.      Allergies  Allergen Reactions   Keflex [Cephalexin] Other (See Comments)    unspecified   Prednisone Other (See Comments)    "counter reacts"   Shellfish Allergy Other (See Comments)    Stomach cramps   Tetracyclines & Related Other (See Comments)    unspecified   Dilaudid [Hydromorphone Hcl] Other (See Comments)    "Lethargy"   Incruse Ellipta [Umeclidinium Bromide] Nausea Only   Tuberculin Tests Other (See Comments)    Review Of Systems:  Constitutional:   + intentional   weight loss,No  night sweats,  Fevers, chills, + fatigue, or  lassitude.  HEENT:   No headaches,  Difficulty swallowing,  Tooth/dental problems, or  Sore throat,                No sneezing, itching, ear ache, nasal congestion, post nasal drip,   CV:  No chest pain,  Orthopnea, PND, swelling in lower extremities, anasarca, dizziness, palpitations,  syncope.   GI  No heartburn, indigestion, abdominal pain, nausea, vomiting, diarrhea, change in bowel habits, loss of appetite, bloody stools.  Resp: + shortness of breath with exertion or at rest.  + excess mucus, + productive cough,  No non-productive cough,  No coughing up of blood.  No change in color of mucus.  + intermittent  wheezing.  No chest wall deformity  Skin: no rash or lesions.  GU: no dysuria, change in color of urine, no urgency or frequency.  No flank pain, no hematuria   MS:  No joint pain or swelling.  No decreased range of motion.  No back pain.  Psych:  No change in mood or affect. + depression or + anxiety.  No memory loss.   Vital Signs BP 104/60 (BP Location: Right Arm, Patient Position: Sitting, Cuff Size: Normal)    Pulse 69    Temp 98.1 F (36.7 C)    Ht '5\' 8"'$  (1.727 m)    Wt 205 lb 3.2 oz (93.1 kg)    SpO2 95%    BMI 31.20 kg/m    Physical Exam:  General- No distress,  A&Ox3, agitated about costs of meds ENT: No sinus tenderness, TM clear, pale nasal mucosa, no oral exudate,no post nasal drip, no LAN Cardiac: S1, S2, regular rate and rhythm, no murmur Chest: No wheeze/ rales/ dullness; no accessory muscle use, no nasal flaring, no sternal retractions Abd.: Soft Non-tender, ND, BS +, Body mass index is 31.2 kg/m. Ext: No clubbing cyanosis, edema, no obvious deformities noted Neuro:  Deconditioning, in wheelchair, MAE x 4, A&O x 3 Skin: No rashes,no lesions,  warm and dry Psych: Agitated about the cost of her medications   Assessment/Plan  COPD, severe ([facility]) At baseline Concerned about cost of medications on new insurance Plan We will have Jonelle Sidle call you about your financial aid information. Continue your Symbicort and Combivent as you have been doing Continue your Singulair and Daliresp as you have been doing. Continue using your rescue nebs prn.  We will have Kathlee Nations, our Billing office call you about over billing. Follow up with Dr. Elsworth Soho or  Judson Roch NP in 3 months. Please contact office for sooner follow up if symptoms do not improve or worsen or seek emergency care  Note your daily symptoms > remember "red flags" for COPD:  Increase in cough, increase in sputum production, increase in shortness of breath or activity intolerance. If you notice these symptoms, please call to be seen.   Please try to remain active and walk as much as you possibly can. Congratulations on your weight loss.  Keep it up.    Acute on chronic respiratory failure with hypoxia ([facility]) Continue wearing your night time oxygen at 2 L every night, and with exertion. Saturation goals are 88-92%  Tobacco abuse I have spent 5 minutes counseling patient on smoking cessation this visit. Patient verbalizes understanding of their  choice to continue smoking and the negative health consequences including worsening of COPD, risk of lung cancer , stroke and heart disease..    She is smoking 1-1.5 packs per day  Pulmonary nodule Followed by [facility] Next scan due 11/2018  Intentional Weight Loss Pt has lost 20 pounds with diet changes Plan Congratulated on recent weight loss Encouraged to monitor weight  Physical deconditioning Pt in wheel chair today Plan Encouraged patient to walk as much as possible using counter top or walker for balance  Dizziness x several months Plan Encouraged to follow up with PCP this week to review BP meds with recent weight loss Encouraged to stand and sit carefully and to make  sure she has a walker and counter to assist with balance   [name], NP 08/24/2018  2:59 PM

## 2018-08-24 NOTE — Assessment & Plan Note (Signed)
Continue wearing your night time oxygen at 2 L every night, and with exertion. Saturation goals are 88-92%

## 2018-08-24 NOTE — Assessment & Plan Note (Signed)
At baseline Concerned about cost of medications on new insurance Plan We will have Jonelle Sidle call you about your financial aid information. Continue your Symbicort and Combivent as you have been doing Continue your Singulair and Daliresp as you have been doing. Continue using your rescue nebs prn.  We will have Kathlee Nations, our Billing office call you about over billing. Follow up with Dr. Elsworth Soho or Judson Roch NP in 3 months. Please contact office for sooner follow up if symptoms do not improve or worsen or seek emergency care  Note your daily symptoms > remember "red flags" for COPD:  Increase in cough, increase in sputum production, increase in shortness of breath or activity intolerance. If you notice these symptoms, please call to be seen.   Please try to remain active and walk as much as you possibly can. Congratulations on your weight loss.  Keep it up.

## 2018-08-24 NOTE — Assessment & Plan Note (Signed)
Followed by [facility] Next scan due 11/2018

## 2018-08-31 ENCOUNTER — Encounter: Payer: Medicare Other | Admitting: Gastroenterology

## 2018-08-31 ENCOUNTER — Ambulatory Visit (INDEPENDENT_AMBULATORY_CARE_PROVIDER_SITE_OTHER): Payer: Medicare Other | Admitting: Licensed Clinical Social Worker

## 2018-08-31 DIAGNOSIS — F4312 Post-traumatic stress disorder, chronic: Secondary | ICD-10-CM

## 2018-08-31 NOTE — Telephone Encounter (Signed)
ATC pt, no answer. Left message for pt to call back.  Please let me speak to patient when she calls back

## 2018-08-31 NOTE — Progress Notes (Signed)
Virtual Visit via Video Note  I connected with Joan [name] on 08/31/18 at  9:00 AM EDT by a video enabled telemedicine application and verified that I am speaking with the correct person using two identifiers.  I discussed the limitations of evaluation and management by telemedicine and the availability of in person appointments. The patient expressed understanding and agreed to proceed.   I discussed the assessment and treatment plan with the patient. The patient was provided an opportunity to ask questions and all were answered. The patient agreed with the plan and demonstrated an understanding of the instructions.   The patient was advised to call back or seek an in-person evaluation if the symptoms worsen or if the condition fails to improve as anticipated.  Type of Therapy: Individual Therapy  Treatment Goals addressed:  Develop and implement effective coping skills to carry out normal responsibilities and participate constructively in relationships   Interventions: Reality Therapy and Supportive Counseling  Summary: Joan [name] is a 57 y.o. female who presents with symptoms of irritability, paranoia, hypervigilance, difficulty concentrating  Therapist Response:  Patient met with clinician for an individual session. Patient reports there has been a lot of tension and arguing with her brother and sister, with whom she lives. She shares about past problems she has had with her sister, and how stressful it is for them to live together, and a lack of communication with her brother. Counselor guided patient to explore the way these things have impacted relationships within the household. Patient states that the relationships are "strenuous" and that it impacts her overall level of anxiety. Counselor challenged patient on moving back in with sister after feeling unwanted and dealing with these same stressors before. Counselor explored with patient how the choices she makes contribute to her  anxiety.Patient reported that older sister ([name]) has always controlled the information between her and brother or other sister ([name]), and gave examples of ways that this drove a wedge between her and others. Counselor and patient brainstormed choices that patient has and ways she can decrease the impact [name]'s actions have on her. Patient states that when [name] moves here from [place] her sisters will live together and it will be just her and brother in the household. Counselor emphasized this as an opportunity to set boundaries and make healthy choices about the power that she gives [name].   Suicidal/Homicidal:  No  Recommendation and plan:  Counseling sessions every two weeks. Next appointment is   Diagnosis:  Post traumatic stress disorder  I provided 54 minutes of non-face-to-face time during this encounter.   , LCSW   

## 2018-08-31 NOTE — Telephone Encounter (Signed)
Ok I will call them today about financial assistance.

## 2018-09-01 ENCOUNTER — Encounter: Payer: Self-pay | Admitting: Gastroenterology

## 2018-09-01 ENCOUNTER — Other Ambulatory Visit: Payer: Self-pay

## 2018-09-01 ENCOUNTER — Ambulatory Visit (INDEPENDENT_AMBULATORY_CARE_PROVIDER_SITE_OTHER): Payer: Medicare Other | Admitting: Gastroenterology

## 2018-09-01 DIAGNOSIS — R142 Eructation: Secondary | ICD-10-CM

## 2018-09-01 DIAGNOSIS — R198 Other specified symptoms and signs involving the digestive system and abdomen: Secondary | ICD-10-CM

## 2018-09-01 DIAGNOSIS — R140 Abdominal distension (gaseous): Secondary | ICD-10-CM | POA: Diagnosis not present

## 2018-09-01 NOTE — Patient Instructions (Addendum)
I have recommended an EGD and colonoscopy for further evaluation. Please call the office when you are ready to schedule.    Some food may make your gas and bloating worse. Please avoid carbonated beverages, caffeinated beverages, sugar substitutes, chewing gum and other gas-producing foods such as legumes, onions, cabbage, brussel sprouts, and artificial sweeteners. Do not gulp foods or liquids! Quit smoking! This is definitely making the gas worse.   Belly breathing can help improve your belching. Sometimes this is called abdominal breathing or diaphragmatic breathing.   When you chest breathe all day, you end up inhaling large quantities of air when you are resting or even sleeping.  This can lead to excess gas and bloating.   Sit upright in a chair. Your spine should be straight, knees bent, shoulders loose, and head and neck relaxed.  Your mouth can be slightly open with teeth separated.   Place on hand on your chest and one hand on your abdomen, just below the rib cage.  Breathe in for 4 second through your nose and feel your stomach pushing out against your hand. Try to keep the hand on your chest from moving.   Hold your stomach muscles tight and then start to let your belly deflate as you exhale for 6 seconds. It helps to purse your lips on the exhale like you are blowing through a straw.

## 2018-09-01 NOTE — Progress Notes (Signed)
TELEHEALTH VISIT  Referring Provider: [name], * Primary Care Physician:  [name], MD   Tele-visit due to COVID-19 pandemic Patient requested visit virtually, consented to the virtual encounter via video enabled telemedicine application (Zoom, then converted to phone call due to a better connection) Contact made at: 11:00 09/01/18 Patient verified by name and date of birth Location of patient: Home Location provider: [facility] Names of persons participating: Me, patient, Tinnie Gens CMA Time spent on telehealth visit: 32 minutes I discussed the limitations of evaluation and management by telemedicine. The patient expressed understanding and agreed to proceed.  Chief complaint:  Need for colon cancer screening  IMPRESSION:  Need for colon cancer screening Frequent belching and flatulence    - not improved with GasEx Change in bowel habits x 1 year Dysphonia evaluated by ENT and thought to be related to smoking Severe COPD on 2L oxygen in the daytime and 3L when active OSA on CPAP Tobacco habituation Wheelchair use for deconditioning   Recent GI symptoms are likely a combination of diet and aerophagia from her COPD, CPAP, and continued smoking.  No alarm features.   Must consider gastric etiologies given the association with eating.  As well as reflux,  functional dyspepsia, supragastric belching, SIBO.  Reviewed belly breathing as a way to minimize swallowed air.   I've recommended the discontinuation of gum chewing, smoking, drinking carbonated beverages, and gulping foods and liquids. Any underlying anxiety or depression should be treated.   Will proceed with colon cancer screening. I have recommended an EGD concurrently given the differential.   PLAN: Continue pantoprazole Colonoscopy at the hospital EGD with gastric and duodenal biopsies Obtain prior colonoscopy and EGD report from [place], [place] Reviewed  diaphragmatic breathing with the patient I recommended that she avoid caffeine, carbonated foods, and other gas-producing foods such as legumes, onions, cabbage, brussel sprouts, and artificial sweeteners. She must quit smoking.  Consider gastric emptying scan (GES) if the EGD is negative. Consider testing for intolerance to lactose, fructose, and sorbitol if GES is nondiagnostic Consider 24 hour pH/impedence testing to evaluate for supragastric belching and air swallowing. Consider trial of FDGuard after EGD.    I consented the patient discussing the risks, benefits, and alternatives to endoscopic evaluation. In particular, we discussed the risks that include, but are not limited to, reaction to medication, cardiopulmonary compromise, bleeding requiring blood transfusion, aspiration resulting in pneumonia, perforation requiring surgery, lack of diagnosis, severe illness requiring hospitalization, and even death. We reviewed the risk of missed lesion including polyps or even cancer. The patient acknowledges these risks and asks that we proceed.   HPI: DHRUTI GHUMAN is a 58 y.o. female previously followed by Dr. Loletha Carrow for frequent belching and flatulence.  She was last seen by Dr. Loletha Carrow 01/01/2018.  She is referred now for colonoscopy for colon cancer screening.  The interval history is obtained to the patient and review of her electronic health record.  She is an every day smoker who has severe COPD, steroid-induced diabetes, obstructive sleep apnea on CPAP, bipolar disorder, hypertension, stage II chronic kidney disease, hypothyroidism, pulmonary nodule followed by the lung cancer screening program.  She has been hospitalized for COPD exacerbations in 12/2017 and 04/2018.  She uses oxygen @ 2L as needed during day and if she is active she uses 3 L. She has been using a wheelchair due to physical deconditioning.  She has frequent, embarrassing belching and flatulence.  Associated bloating and distension  that occurs while eating or immediately following.  Intermittent malodorous. Her bowel habits were previously regular, but for the last couple of years she will have some feelings of lower abdominal pressure, need for a bowel movement, then just small pellets will pass.  She denies rectal bleeding, melena, or hematochezia.  She has recently lost 20 pounds with diet changes. No identified specific food triggers. GasEx provided no significant relief.  No other associated symptoms. No identified exacerbating or relieving features.   Change in bowel habits over the last year. Stools fluctuate from formed (rabbit pellets) to liquid.   She uses Sweet-n-Low. Drinks a 12 pack of creamed soda every couple of weeks. Drinks coffee daily.   Had an upper endoscopy and colonoscopy in [place], [place] many years ago for similar symptoms. She feels like she was in her 64s when those were performed.  Dr. Loletha Carrow felt her symptoms were combination of dietary, maldigestion, and aerophagia from her COPD and continued smoking. She feels like the symptoms are too severe to be related to her COPD and smoking.   No known family history of colon cancer or polyps. No family history of uterine/endometrial cancer, pancreatic cancer or gastric/stomach cancer.  Prior abdominal in imaging: CT of the abdomen and pelvis with contrast 06/03/2017 obtained to evaluate right upper quadrant abdominal pain: Mesh repair of an umbilical hernia, small fluid collection in the extraperitoneal abdominal wall superficial to the mesh, no residual hernia, stable L5-S1 posterior instrumented fusion, right hip replacement  Labs from March 2020 show a hemoglobin of 12.4, MCV 91.9, RDW 15.4.  Past Medical History:  Diagnosis Date  . Acute respiratory failure with hypoxia ([place])   . Alcoholism and drug addiction in family   . Anxiety   . Arthritis    "knees, hips, lower back" (09/25/2016)  . Asthma   . Bipolar disorder ([place])   . Chronic  bronchitis ([place])    "all the time" (09/25/2016)  . Chronic leg pain    RLE  . Chronic lower back pain   . COPD (chronic obstructive pulmonary disease) ([place])   . Depression   . Emphysema of lung ([place])   . GERD (gastroesophageal reflux disease)   . Headache   . Hepatitis C    "treated back in 2001-2002"  . High cholesterol   . Hypertension   . Hypothyroidism   . Incarcerated ventral hernia 04/04/2017  . On home oxygen therapy    "2L; at nighttime" (04/04/2017)  . OSA on CPAP   . Pneumonia    "all the time" (09/25/2016)  . Positive PPD    with negative chest x ray  . Tachycardia    patient reports with exertion ; denies any other conditions   . Thyroid disease     Past Surgical History:  Procedure Laterality Date  . BACK SURGERY    . CARDIAC CATHETERIZATION    . COLONOSCOPY    . CYST EXCISION     removal of cyst in sinuses  . DILATION AND CURETTAGE OF UTERUS    . FOOT SURGERY Bilateral    bone removed from 4th and 5 th toes and put in pin then took pin out  . HERNIA REPAIR    . INCISION AND DRAINAGE  ~ 2014/2015   "removed mesh & infection"  . INCISIONAL HERNIA REPAIR N/A 04/04/2017   Procedure: LAPAROSCOPIC INCISIONAL HERNIA REPAIR WITH MESH;  Surgeon: Fanny Skates, MD;  Location: [facility];  Service: General;  Laterality: N/A;  . LACERATION  REPAIR Right    repair wrist artery from laceration from ice skate  . LAPAROSCOPIC INCISIONAL / UMBILICAL / VENTRAL HERNIA REPAIR  04/04/2017   LAPAROSCOPIC INCISIONAL HERNIA REPAIR WITH MESH  . LIPOMA EXCISION Right    fattty lipoma in neck  . NASAL SEPTUM SURGERY    . POSTERIOR LUMBAR FUSION  2015/2016 X 2   "added rods and screws"  . TOTAL HIP ARTHROPLASTY Right 02/05/2017   Procedure: RIGHT TOTAL HIP ARTHROPLASTY;  Surgeon: [name], MD;  Location: [facility];  Service: Orthopedics;  Laterality: Right;  . TOTAL HIP ARTHROPLASTY Left 06/18/2017   Procedure: LEFT TOTAL HIP ARTHROPLASTY;  Surgeon: [name], MD;  Location: [facility];  Service: Orthopedics;  Laterality: Left;  . TUBAL LIGATION    . UMBILICAL HERNIA REPAIR  ~ 2013   w/mesh  . UPPER GI ENDOSCOPY      Current Outpatient Medications  Medication Sig Dispense Refill  . acyclovir (ZOVIRAX) 400 MG tablet Take 1 tablet (400 mg total) by mouth 2 (two) times daily. 180 tablet 3  . albuterol (PROVENTIL) (2.5 MG/3ML) 0.083% nebulizer solution Take 3 mLs (2.5 mg total) by nebulization 2 (two) times daily. And every 6 hours as needed.  DX: J44.9 360 mL 3  . atenolol (TENORMIN) 25 MG tablet Take 1 tablet (25 mg total) by mouth daily. 90 tablet 3  . atorvastatin (LIPITOR) 20 MG tablet Take 1 tablet (20 mg total) by mouth daily. 90 tablet 3  . baclofen (LIORESAL) 10 MG tablet Take 10 mg by mouth at bedtime.    . Blood Glucose Monitoring Suppl (ONE TOUCH ULTRA 2) w/Device KIT Use to check blood sugars twice a day (Patient not taking: Reported on 08/24/2018) 1 each 0  . budesonide-formoterol (SYMBICORT) 160-4.5 MCG/ACT inhaler Inhale 2 puffs into the lungs 2 (two) times daily. 2 Inhaler 0  . buPROPion (WELLBUTRIN XL) 150 MG 24 hr tablet Take 1 tablet (150 mg total) by mouth daily. 90 tablet 2  . cyclobenzaprine (FLEXERIL) 10 MG tablet Take 1 tablet (10 mg total) by mouth at bedtime. 90 tablet 1  . diphenhydrAMINE (BENADRYL) 25 MG tablet Take 25 mg by mouth 2 (two) times daily.     Marland Kitchen gabapentin (NEURONTIN) 300 MG capsule Take 1 capsule (300 mg total) by mouth 2 (two) times daily. 180 capsule 1  . glucose blood (ONE TOUCH ULTRA TEST) test strip Use to check blood sugars twice a day 100 each 12  . hydrochlorothiazide (HYDRODIURIL) 25 MG tablet Take 1 tablet (25 mg total) by mouth daily. 90 tablet 3  . Ipratropium-Albuterol (COMBIVENT RESPIMAT) 20-100 MCG/ACT AERS respimat Inhale 1 puff into the lungs every 6 (six) hours as needed for wheezing. (Patient taking differently: Inhale 1 puff into the lungs every 6 (six) hours. ) 3 Inhaler 3  . Lancets (ONETOUCH ULTRASOFT) lancets  Use to check blood sugars twice a day (Patient not taking: Reported on 08/24/2018) 100 each 12  . levofloxacin (LEVAQUIN) 500 MG tablet Take 1 tablet (500 mg total) by mouth daily. 7 tablet 0  . levothyroxine (SYNTHROID, LEVOTHROID) 25 MCG tablet Take 1 tablet (25 mcg total) by mouth daily before breakfast. 90 tablet 3  . montelukast (SINGULAIR) 10 MG tablet Take 1 tablet (10 mg total) by mouth at bedtime. 90 tablet 3  . naproxen (NAPROSYN) 500 MG tablet Take 1 tablet (500 mg total) by mouth 2 (two) times daily with a meal. 180 tablet 1  . nystatin-triamcinolone ointment (MYCOLOG) Apply 1 application  topically 2 (two) times daily as needed (applied to skin folds/groins/under breast skin irritation rash.). 100 g 3  . OXYGEN Inhale 2 L into the lungs daily as needed (strenous activity).     . pantoprazole (PROTONIX) 40 MG tablet Take 1 tablet (40 mg total) by mouth daily. 90 tablet 3  . Roflumilast (DALIRESP) 250 MCG TABS Take 500 mcg by mouth daily. 112 tablet 0  . roflumilast (DALIRESP) 500 MCG TABS tablet Take 1 tablet (500 mcg total) by mouth daily. 90 tablet 3  . SYMBICORT 160-4.5 MCG/ACT inhaler Inhale 2 puffs into the lungs 2 (two) times daily. 3 Inhaler 3  . traZODone (DESYREL) 100 MG tablet Take 1 tablet (100 mg total) by mouth at bedtime. 90 tablet 3  . venlafaxine XR (EFFEXOR-XR) 150 MG 24 hr capsule TAKE 1 CAPSULE DAILY WITH  BREAKFAST (Patient taking differently: Take 150 mg by mouth daily with breakfast. ) 90 capsule 3   No current facility-administered medications for this visit.     Allergies as of 09/01/2018 - Review Complete 08/24/2018  Allergen Reaction Noted  . Keflex [cephalexin] Other (See Comments) 07/27/2013  . Prednisone Other (See Comments) 08/09/2016  . Shellfish allergy Other (See Comments) 07/27/2013  . Tetracyclines & related Other (See Comments) 07/27/2013  . Dilaudid [hydromorphone hcl] Other (See Comments) 08/09/2016  . Incruse ellipta [umeclidinium bromide]  Nausea Only 06/09/2017  . Tuberculin tests Other (See Comments) 06/23/2017    Family History  Problem Relation Age of Onset  . Diabetes Mother   . Hypertension Mother   . Hypertension Father   . Cancer Father        lung    Social History   Socioeconomic History  . Marital status: Widowed    Spouse name: Not on file  . Number of children: 3  . Years of education: Not on file  . Highest education level: Not on file  Occupational History  . Not on file  Social Needs  . Financial resource strain: Somewhat hard  . Food insecurity    Worry: Never true    Inability: Never true  . Transportation needs    Medical: No    Non-medical: No  Tobacco Use  . Smoking status: Current Every Day Smoker    Packs/day: 1.00    Years: 44.00    Pack years: 44.00    Types: Cigarettes  . Smokeless tobacco: Never Used  . Tobacco comment: Smoker 1-1.5 PPD every day  Substance and Sexual Activity  . Alcohol use: Yes  . Drug use: Yes    Types: "Crack" cocaine    Comment: 04/04/2017 "nothing since 01/12/2012"  . Sexual activity: Never  Lifestyle  . Physical activity    Days per week: 0 days    Minutes per session: 0 min  . Stress: To some extent  Relationships  . Social Herbalist on phone: Three times a week    Gets together: Once a week    Attends religious service: Never    Active member of club or organization: No    Attends meetings of clubs or organizations: Never    Relationship status: Widowed  . Intimate partner violence    Fear of current or ex partner: No    Emotionally abused: No    Physically abused: No    Forced sexual activity: No  Other Topics Concern  . Not on file  Social History Narrative  . Not on file    Review of Systems: ALL  ROS discussed and all others negative except listed in HPI.  Physical Exam: Complete physical exam not performed due to the limits inherent in a telehealth encounter.  General: Awake, alert, and oriented, and well  communicative. In no acute distress.  HEENT: EOMI, non-icteric sclera, NCAT, MMM  Neck: Normal movement of head and neck  Pulm: No labored breathing, speaking in full sentences without conversational dyspnea  Derm: No apparent lesions or bruising in visible field  MS: Moves all visible extremities without noticeable abnormality  Psych: Pleasant, cooperative, normal speech, normal affect and normal insight Neuro: Alert and appropriate   Shinita Mac L. Tarri Glenn, MD, MPH [facility] 09/01/2018, 10:15 AM

## 2018-09-08 DIAGNOSIS — J9611 Chronic respiratory failure with hypoxia: Secondary | ICD-10-CM | POA: Diagnosis not present

## 2018-09-08 DIAGNOSIS — J449 Chronic obstructive pulmonary disease, unspecified: Secondary | ICD-10-CM | POA: Diagnosis not present

## 2018-09-08 DIAGNOSIS — J441 Chronic obstructive pulmonary disease with (acute) exacerbation: Secondary | ICD-10-CM | POA: Diagnosis not present

## 2018-09-08 NOTE — Telephone Encounter (Signed)
Still in process will update when I hear something back from them.

## 2018-09-10 NOTE — Telephone Encounter (Signed)
Called patient,  Looked over her account with her, in 2019 Holland Falling was assesing her a $55 copay for her pulmonary visits, but she was only paying a $25 copay.  I explained that we have to abide by what Holland Falling tells [place] her copay is, and if she has a dispute with it, she will have to contact [employer] to discuss with them.

## 2018-09-13 ENCOUNTER — Encounter: Payer: Self-pay | Admitting: Internal Medicine

## 2018-09-15 ENCOUNTER — Ambulatory Visit (INDEPENDENT_AMBULATORY_CARE_PROVIDER_SITE_OTHER): Payer: Medicare Other | Admitting: Licensed Clinical Social Worker

## 2018-09-15 ENCOUNTER — Other Ambulatory Visit: Payer: Self-pay

## 2018-09-15 ENCOUNTER — Telehealth: Payer: Self-pay | Admitting: *Deleted

## 2018-09-15 ENCOUNTER — Telehealth: Payer: Self-pay | Admitting: Emergency Medicine

## 2018-09-15 DIAGNOSIS — F4312 Post-traumatic stress disorder, chronic: Secondary | ICD-10-CM | POA: Diagnosis not present

## 2018-09-15 DIAGNOSIS — Z20828 Contact with and (suspected) exposure to other viral communicable diseases: Secondary | ICD-10-CM

## 2018-09-15 DIAGNOSIS — Z20822 Contact with and (suspected) exposure to covid-19: Secondary | ICD-10-CM

## 2018-09-15 NOTE — Telephone Encounter (Signed)
Thank you :)

## 2018-09-15 NOTE — Telephone Encounter (Signed)
Spoke with patient to schedule her for hospital procedure on 10-20-2018 she states she is going to be out of state until 11-05-2018. Will set reminder to call her back to schedule her for the next available hospital date.

## 2018-09-15 NOTE — Progress Notes (Signed)
Patient ID: Joan [name], female   DOB: [birth year], 58 y.o.   MRN: [id number] Virtual Visit via Video Note  I connected with Joan [name] on 09/15/18 at 10:00 AM EDT by a video enabled telemedicine application and verified that I am speaking with the correct person using two identifiers.   I discussed the limitations of evaluation and management by telemedicine and the availability of in person appointments. The patient expressed understanding and agreed to proceed.  I discussed the assessment and treatment plan with the patient. The patient was provided an opportunity to ask questions and all were answered. The patient agreed with the plan and demonstrated an understanding of the instructions.   The patient was advised to call back or seek an in-person evaluation if the symptoms worsen or if the condition fails to improve as anticipated.  Type of Therapy: Individual Therapy  Treatment Goals addressed:  Develop and implement coping skills to engage in healthy functioning and relationships.   Interventions: Reality Therapy, Supportive Counseling  Summary: [name] is a 58 y.o female who presents with symptoms of irritability, difficulty concentrating, some paranoia.  Therapist Response:  Patient met with clinician for an individual session. Patient reports that she still has some of the paranoia, but she does not talk about it a lot because if she says the wrong thing to someone she may "get locked up". She expresses excitement about upcoming travel for son's wedding. Counselor processed with patient her expectations for the trip. Patient appears to be flexible with the situation, and shares that she feels this will give her some time away from her stressors since the brother and sister she lives with will not be traveling with her. Counselor and patient brainstormed ways that patient can cope with unexpected disappointments or frustrations on the trip. Counselor commended patient for her positive  attitude. Patient explored ways she is trying to keep herself in a more peaceful state of mind.   Suicidal/Homicidal:  No  Recommendation and plan:  Continued sessions every other week  Diagnosis:  PTSD  I provided 52 minutes of non-face-to-face time during this encounter.   Lillie Fragmin, LCSW

## 2018-09-15 NOTE — Telephone Encounter (Signed)
Pt returned call, advised of message left.

## 2018-09-15 NOTE — Telephone Encounter (Signed)
Left message for patient to call back to inform pt COVID testing lab order placed. She can go to [facility] for testing. CRM created.

## 2018-09-15 NOTE — Telephone Encounter (Signed)
-----   Message from Cassandria Anger, MD sent at 09/15/2018 12:00 AM EDT ----- Marzetta Board, Please make arrangements for COVID-19 testing tomorrow Thank you  ----- Message -----      From:Presly Daun Peacock      Sent:09/13/2018  3:53 PM EDT        MN:[id number] Becky Augusta, MD   Subject:Non-Urgent Medical Question  Hello doctor Sharlet Salina this is Krystn Dermody date of birth is [birth year] I am going to [place] for my son's wedding I am looking to have did covid-19 test done as soon as possible hopefully we can do this as soon as possible I need to have it before the end of the week if you need to discuss this with me my number is [phone] please and thank you I just want to add if we do the chats this week and I definitely will have the test results the following week then we can do it that way but if I'm not able to travel to [place] without the paperwork I will have to quarantine for two weeks this is why I need to get this done again thank you

## 2018-09-17 DIAGNOSIS — H538 Other visual disturbances: Secondary | ICD-10-CM | POA: Diagnosis not present

## 2018-09-18 LAB — NOVEL CORONAVIRUS, NAA: SARS-CoV-2, NAA: NOT DETECTED

## 2018-10-01 ENCOUNTER — Telehealth (HOSPITAL_COMMUNITY): Payer: Self-pay | Admitting: Licensed Clinical Social Worker

## 2018-10-01 ENCOUNTER — Ambulatory Visit (HOSPITAL_COMMUNITY): Payer: Medicare Other | Admitting: Licensed Clinical Social Worker

## 2018-10-01 ENCOUNTER — Other Ambulatory Visit: Payer: Self-pay

## 2018-10-03 DIAGNOSIS — Z91013 Allergy to seafood: Secondary | ICD-10-CM | POA: Diagnosis not present

## 2018-10-03 DIAGNOSIS — I10 Essential (primary) hypertension: Secondary | ICD-10-CM | POA: Diagnosis not present

## 2018-10-03 DIAGNOSIS — E119 Type 2 diabetes mellitus without complications: Secondary | ICD-10-CM | POA: Diagnosis not present

## 2018-10-03 DIAGNOSIS — E7800 Pure hypercholesterolemia, unspecified: Secondary | ICD-10-CM | POA: Diagnosis not present

## 2018-10-03 DIAGNOSIS — Z9889 Other specified postprocedural states: Secondary | ICD-10-CM | POA: Diagnosis not present

## 2018-10-03 DIAGNOSIS — J441 Chronic obstructive pulmonary disease with (acute) exacerbation: Secondary | ICD-10-CM | POA: Diagnosis not present

## 2018-10-03 DIAGNOSIS — R918 Other nonspecific abnormal finding of lung field: Secondary | ICD-10-CM | POA: Diagnosis not present

## 2018-10-03 DIAGNOSIS — I4510 Unspecified right bundle-branch block: Secondary | ICD-10-CM | POA: Diagnosis not present

## 2018-10-03 DIAGNOSIS — Z881 Allergy status to other antibiotic agents status: Secondary | ICD-10-CM | POA: Diagnosis not present

## 2018-10-03 DIAGNOSIS — R0602 Shortness of breath: Secondary | ICD-10-CM | POA: Diagnosis not present

## 2018-10-03 DIAGNOSIS — R0600 Dyspnea, unspecified: Secondary | ICD-10-CM | POA: Diagnosis not present

## 2018-10-03 DIAGNOSIS — Z888 Allergy status to other drugs, medicaments and biological substances status: Secondary | ICD-10-CM | POA: Diagnosis not present

## 2018-10-06 ENCOUNTER — Encounter: Payer: Self-pay | Admitting: Internal Medicine

## 2018-10-07 ENCOUNTER — Encounter: Payer: Self-pay | Admitting: Internal Medicine

## 2018-10-07 ENCOUNTER — Other Ambulatory Visit: Payer: Self-pay | Admitting: Internal Medicine

## 2018-10-08 ENCOUNTER — Other Ambulatory Visit: Payer: Self-pay | Admitting: Internal Medicine

## 2018-10-09 DIAGNOSIS — J9611 Chronic respiratory failure with hypoxia: Secondary | ICD-10-CM | POA: Diagnosis not present

## 2018-10-09 DIAGNOSIS — J441 Chronic obstructive pulmonary disease with (acute) exacerbation: Secondary | ICD-10-CM | POA: Diagnosis not present

## 2018-10-09 DIAGNOSIS — J449 Chronic obstructive pulmonary disease, unspecified: Secondary | ICD-10-CM | POA: Diagnosis not present

## 2018-10-12 NOTE — Telephone Encounter (Signed)
Returned from out of state earlier than she expected. Is now ready to schedule endo colon hospital procedure.

## 2018-10-15 ENCOUNTER — Ambulatory Visit (HOSPITAL_COMMUNITY): Payer: Medicare Other | Admitting: Licensed Clinical Social Worker

## 2018-10-15 ENCOUNTER — Telehealth: Payer: Self-pay | Admitting: Gastroenterology

## 2018-10-15 DIAGNOSIS — J449 Chronic obstructive pulmonary disease, unspecified: Secondary | ICD-10-CM | POA: Diagnosis not present

## 2018-10-15 DIAGNOSIS — G4733 Obstructive sleep apnea (adult) (pediatric): Secondary | ICD-10-CM | POA: Diagnosis not present

## 2018-10-15 DIAGNOSIS — R142 Eructation: Secondary | ICD-10-CM

## 2018-10-15 DIAGNOSIS — R198 Other specified symptoms and signs involving the digestive system and abdomen: Secondary | ICD-10-CM

## 2018-10-15 DIAGNOSIS — Z1211 Encounter for screening for malignant neoplasm of colon: Secondary | ICD-10-CM

## 2018-10-15 DIAGNOSIS — J441 Chronic obstructive pulmonary disease with (acute) exacerbation: Secondary | ICD-10-CM | POA: Diagnosis not present

## 2018-10-15 DIAGNOSIS — R140 Abdominal distension (gaseous): Secondary | ICD-10-CM

## 2018-10-15 MED ORDER — NA SULFATE-K SULFATE-MG SULF 17.5-3.13-1.6 GM/177ML PO SOLN: 1.0000 | ORAL | 0 refills | Status: AC

## 2018-10-15 NOTE — Telephone Encounter (Signed)
Patient informed to take Metamucil daily via mychart.

## 2018-10-15 NOTE — Telephone Encounter (Signed)
Spoke with patient she states she is having hard stools and has been taking colace. She has right sided abdominal pain. Last BM was small pebbles. Advised her to take Miralax once a day to keep bowel movements regular. Any other recommendations for patient?  Patient also advised on Dr. Tarri Glenn next hospital procedure day in September and she agreed to schedule. She is aware she has to be covid tested before the procedure and agrees to self quarantine as well.

## 2018-10-15 NOTE — Addendum Note (Signed)
Addended by: Wyline Beady on: 10/15/2018 01:40 PM   Modules accepted: Orders, SmartSet

## 2018-10-15 NOTE — Telephone Encounter (Signed)
Thank you for your help. I think a daily stool bulking agent such as Metamucil may provide better control of her symptoms than Colace. Thank you for scheduling the endoscopy.

## 2018-10-15 NOTE — Telephone Encounter (Signed)
See phone note from 10-15-2018

## 2018-10-15 NOTE — Telephone Encounter (Signed)
Joan [name], it looks like you've talked to this patient. She has returned from out of town earlier than expected. She can be scheduled for her endo/colon at the hospital.

## 2018-10-15 NOTE — Telephone Encounter (Signed)
Pt reported that she is experiencing discomfort on her right side.  She would like to schedule a hospital EGD/col.

## 2018-10-21 ENCOUNTER — Other Ambulatory Visit: Payer: Self-pay

## 2018-10-21 DIAGNOSIS — J449 Chronic obstructive pulmonary disease, unspecified: Secondary | ICD-10-CM

## 2018-10-23 ENCOUNTER — Other Ambulatory Visit: Payer: Self-pay | Admitting: Internal Medicine

## 2018-10-26 ENCOUNTER — Encounter (HOSPITAL_BASED_OUTPATIENT_CLINIC_OR_DEPARTMENT_OTHER): Payer: Self-pay

## 2018-10-28 NOTE — Telephone Encounter (Signed)
Received email from patient,  "After all that writing I forgot to ask about what is going to be done regarding the black spot that was found"   Attempted to call patient to schedule an OV, reached a busy signal, replied to patient's email letting her know that we attempted to reach her and to call our office to schedule appointment.

## 2018-10-29 ENCOUNTER — Telehealth: Payer: Self-pay | Admitting: Pulmonary Disease

## 2018-10-29 ENCOUNTER — Telehealth: Payer: Self-pay

## 2018-10-29 ENCOUNTER — Other Ambulatory Visit: Payer: Self-pay | Admitting: Internal Medicine

## 2018-10-29 DIAGNOSIS — R911 Solitary pulmonary nodule: Secondary | ICD-10-CM

## 2018-10-29 NOTE — Telephone Encounter (Signed)
Left message for patient to call back. I have checked RA's box and did not see any forms from Dr. Aurea Graff office nor the CXR report.

## 2018-10-29 NOTE — Telephone Encounter (Signed)
Pt was also asking if chest x-ray report was received from [facility] in [place], [place] and wondering what the next step would be if received.

## 2018-10-29 NOTE — Telephone Encounter (Signed)
Copied from [place] [id number]. Topic: General - Other >> Oct 29, 2018 12:08 PM Pauline Good wrote: Reason for CRM: FYI surgical clearance forms will be faxed to you for pt.

## 2018-10-29 NOTE — Telephone Encounter (Signed)
Noted  

## 2018-10-30 ENCOUNTER — Other Ambulatory Visit: Payer: Self-pay | Admitting: Internal Medicine

## 2018-10-30 NOTE — Telephone Encounter (Signed)
Received surgical clearance form from Dr. Aurea Graff office. Will review her last OV and see if she will need to have an appt for clearance.

## 2018-10-30 NOTE — Telephone Encounter (Signed)
LMTCB x2 for pt 

## 2018-10-30 NOTE — Telephone Encounter (Signed)
Spoke with pt, I advised her that Wallene Dales was going to call her after she reviews her chart.  She asked about patient assistance forms and I advised her that the pharmacy team has the referral to help with her medications. Pt understood.

## 2018-10-30 NOTE — Telephone Encounter (Signed)
Pt returning call.  [phone].

## 2018-11-03 NOTE — Telephone Encounter (Signed)
Spoke with patient. She has been scheduled for a OV with SG on 11/09/2018 at 1130. Will leave the surgical clearance form with Jonelle Sidle.   While on the phone, she stated that she was still confused about patient assistance. She said she has been here several times and was advised by Judson Roch and Jonelle Sidle that the forms were faxed over to the manufacturers. I see where it was documented in her chart that the forms were faxed to [facility] and [facility] on 5/12. They were not scanned into her chart. She has not heard anything and is running out of her medication. Advised patient that I would call both companies to see what else is needed.   Spoke with Levada Dy with [facility], she searched for the patient in their database. They never received the application or any of the patient's information.   Spoke with Verdis Frederickson at [facility], she searched for the patient in their database as well. Did not see anything in regards to patient assistance forms.   Spoke with patient again. She was upset that the companies have not received her application. I advised her that I would go ahead and print out new applications for her and fill out everything. She will check to see if she had copies of what was given to our office back in May. Patient will come by the office sometime this week to sign the forms. Advised her when she gets to the door, to ask for me as I will have her forms and will make sure they will get completed and sent back to the manufacturers. Patient verbalized understanding.   Will keep this encounter open for follow up.

## 2018-11-05 NOTE — Telephone Encounter (Signed)
Per Cherina, pt has not come by the office yet to sign these forms. Will keep encounter open to follow up on.

## 2018-11-06 ENCOUNTER — Encounter: Payer: Self-pay | Admitting: Pulmonary Disease

## 2018-11-06 ENCOUNTER — Ambulatory Visit (INDEPENDENT_AMBULATORY_CARE_PROVIDER_SITE_OTHER): Payer: Medicare Other | Admitting: Pulmonary Disease

## 2018-11-06 ENCOUNTER — Telehealth: Payer: Self-pay | Admitting: Pulmonary Disease

## 2018-11-06 ENCOUNTER — Other Ambulatory Visit: Payer: Self-pay

## 2018-11-06 DIAGNOSIS — J449 Chronic obstructive pulmonary disease, unspecified: Secondary | ICD-10-CM | POA: Diagnosis not present

## 2018-11-06 DIAGNOSIS — Z9989 Dependence on other enabling machines and devices: Secondary | ICD-10-CM

## 2018-11-06 DIAGNOSIS — Z0001 Encounter for general adult medical examination with abnormal findings: Secondary | ICD-10-CM | POA: Insufficient documentation

## 2018-11-06 DIAGNOSIS — J9611 Chronic respiratory failure with hypoxia: Secondary | ICD-10-CM | POA: Diagnosis not present

## 2018-11-06 DIAGNOSIS — F17210 Nicotine dependence, cigarettes, uncomplicated: Secondary | ICD-10-CM

## 2018-11-06 DIAGNOSIS — J441 Chronic obstructive pulmonary disease with (acute) exacerbation: Secondary | ICD-10-CM | POA: Diagnosis not present

## 2018-11-06 DIAGNOSIS — R911 Solitary pulmonary nodule: Secondary | ICD-10-CM

## 2018-11-06 DIAGNOSIS — G4733 Obstructive sleep apnea (adult) (pediatric): Secondary | ICD-10-CM

## 2018-11-06 DIAGNOSIS — Z720 Tobacco use: Secondary | ICD-10-CM

## 2018-11-06 DIAGNOSIS — Z79899 Other long term (current) drug therapy: Secondary | ICD-10-CM

## 2018-11-06 MED ORDER — AZITHROMYCIN 250 MG PO TABS: ORAL_TABLET | ORAL | 0 refills | Status: DC

## 2018-11-06 MED ORDER — DALIRESP 500 MCG PO TABS: 500.0000 ug | ORAL_TABLET | Freq: Every day | ORAL | 3 refills | Status: DC

## 2018-11-06 MED ORDER — PREDNISONE 10 MG PO TABS: ORAL_TABLET | ORAL | 0 refills | Status: DC

## 2018-11-06 NOTE — Telephone Encounter (Signed)
Called and spoke with pt to check to see if she has come by the office to sign pt assistance forms yet. Pt states she has not found her previous application yet, but will continue to look for it. She further notes if she finds it she will bring it to the office for her appt with SG on Monday 11/09/2018 for signature. I let her know that is fine and if she could not find the forms that we could provide her with new forms to sign. Pt expressed understanding with no additional questions. Routing to [employer] nurse, Jonelle Sidle, as an [place]. Nothing further needed at this time.

## 2018-11-06 NOTE — Telephone Encounter (Signed)
Called pt and advised message from the provider. Pt understood and verbalized understanding. Nothing further is needed.   Televisit scheduled with Joan [name] at 3:30 pm

## 2018-11-06 NOTE — Patient Instructions (Addendum)
You were seen today by [name], NP  for:   1. COPD, severe ([place])  - AMB Referral to [place] [facility] - azithromycin (ZITHROMAX) 250 MG tablet; 500mg  (two tablets) today, then 250mg  (1 tablet) for the next 4 days  Dispense: 6 tablet; Refill: 0 - predniSONE (DELTASONE) 10 MG tablet; 4 tabs for 2 days, then 3 tabs for 2 days, 2 tabs for 2 days, then 1 tab for 2 days, then stop  Dispense: 20 tablet; Refill: 0  Continue Symbicort 160 >>> 2 puffs in the morning right when you wake up, rinse out your mouth after use, 12 hours later 2 puffs, rinse after use >>> Take this daily, no matter what >>> This is not a rescue inhaler   Continue Combivent   2. Chronic respiratory failure with hypoxia (HCC)  Continue oxygen therapy as prescribed  >>>maintain oxygen saturations greater than 88 percent  >>>if unable to maintain oxygen saturations please contact the office  >>>do not smoke with oxygen  >>>can use nasal saline gel or nasal saline rinses to moisturize nose if oxygen causes dryness   3. OSA on CPAP  We recommend that you continue using your CPAP daily >>>Keep up the hard work using your device >>> Goal should be wearing this for the entire night that you are sleeping, at least 4 to 6 hours  Remember:  . Do not drive or operate heavy machinery if tired or drowsy.  . Please notify the supply company and office if you are unable to use your device regularly due to missing supplies or machine being broken.  . Work on maintaining a healthy weight and following your recommended nutrition plan  . Maintain proper daily exercise and movement  . Maintaining proper use of your device can also help improve management of other chronic illnesses such as: Blood pressure, blood sugars, and weight management.   BiPAP/ CPAP Cleaning:  >>>Clean weekly, with Dawn soap, and bottle brush.  Set up to air dry.    4. COPD exacerbation (HCC)  - AMB Referral to [place] [facility] - azithromycin  (ZITHROMAX) 250 MG tablet; 500mg  (two tablets) today, then 250mg  (1 tablet) for the next 4 days  Dispense: 6 tablet; Refill: 0 - predniSONE (DELTASONE) 10 MG tablet; 4 tabs for 2 days, then 3 tabs for 2 days, 2 tabs for 2 days, then 1 tab for 2 days, then stop  Dispense: 20 tablet; Refill: 0  YOU MUST STOP SMOKING   5. Pulmonary nodule  Continue follow-up in lung cancer screening  6. Tobacco abuse  Please stop smoking  Please contact your pharmacy and insurance company to see what the coverage would be for Chantix  We recommend that you stop smoking.  >>>You need to set a quit date >>>If you have friends or family who smoke, let them know you are trying to quit and not to smoke around you or in your living environment  Smoking Cessation Resources:  1 800 QUIT NOW  >>> Patient to call this resource and utilize it to help support her quit smoking >>> Keep up your hard work with stopping smoking  You can also contact the [facility] >>>For smoking cessation classes call 2625492033  We do not recommend using e-cigarettes as a form of stopping smoking  You can sign up for smoking cessation support texts and information:  >>>https://smokefree.gov/smokefreetxt   7. Medication management  [facility] help with [facility]  -  AMB Referral to [place] [facility]   We recommend today:  Orders Placed This Encounter  Procedures  . AMB Referral to [place] [facility]    Referral Priority:   Routine    Referral Type:   Consultation    Referral Reason:   [facility]    Number of Visits Requested:   1   Orders Placed This Encounter  Procedures  . AMB Referral to [place] [facility]   Meds ordered this encounter  Medications  . azithromycin (ZITHROMAX) 250 MG tablet    Sig: 500mg  (two tablets) today, then 250mg  (1 tablet) for the next 4 days    Dispense:  6 tablet    Refill:  0  . predniSONE (DELTASONE) 10 MG  tablet    Sig: 4 tabs for 2 days, then 3 tabs for 2 days, 2 tabs for 2 days, then 1 tab for 2 days, then stop    Dispense:  20 tablet    Refill:  0    Follow Up:    Return in about 3 days (around 11/09/2018), or if symptoms worsen or fail to improve, for Follow up with Eric Form ACAGNP-BC.   Please do your part to reduce the spread of COVID-19:      Reduce your risk of any infection  and COVID19 by using the similar precautions used for avoiding the common cold or flu:  Marland Kitchen Wash your hands often with soap and warm water for at least 20 seconds.  If soap and water are not readily available, use an alcohol-based hand sanitizer with at least 60% alcohol.  . If coughing or sneezing, cover your mouth and nose by coughing or sneezing into the elbow areas of your shirt or coat, into a tissue or into your sleeve (not your hands). Langley Gauss A MASK when in public  . Avoid shaking hands with others and consider head nods or verbal greetings only. . Avoid touching your eyes, nose, or mouth with unwashed hands.  . Avoid close contact with people who are sick. . Avoid places or events with large numbers of people in one location, like concerts or sporting events. . If you have some symptoms but not all symptoms, continue to monitor at home and seek medical attention if your symptoms worsen. . If you are having a medical emergency, call 911.   [place] / e-Visit: [email or web address]         [facility]: [facility]: [id number]                   [facility]: [id number]     It is flu season:   >>> Best ways to protect herself from the flu: Receive the yearly flu vaccine, practice good hand hygiene washing with soap and also using hand sanitizer when available, eat a nutritious meals, get adequate rest, hydrate appropriately   Please  contact the office if your symptoms worsen or you have concerns that you are not improving.   Thank you for choosing [facility] for your healthcare, and for allowing [place] to partner with you on your healthcare journey. I am thankful to be able to provide care to you today.   Wyn Quaker FNP-C   Chronic Obstructive Pulmonary Disease Exacerbation Chronic obstructive pulmonary disease (COPD) is a long-term (chronic) lung problem. In COPD, the flow of air from the lungs is limited. COPD exacerbations are times  that breathing gets worse and you need more than your normal treatment. Without treatment, they can be life threatening. If they happen often, your lungs can become more damaged. If your COPD gets worse, your doctor may treat you with:  Medicines.  Oxygen.  Different ways to clear your airway, such as using a mask. Follow these instructions at home: Medicines  Take over-the-counter and prescription medicines only as told by your doctor.  If you take an antibiotic or steroid medicine, do not stop taking the medicine even if you start to feel better.  Keep up with shots (vaccinations) as told by your doctor. Be sure to get a yearly (annual) flu shot. Lifestyle  Do not smoke. If you need help quitting, ask your doctor.  Eat healthy foods.  Exercise regularly.  Get plenty of sleep.  Avoid tobacco smoke and other things that can bother your lungs.  Wash your hands often with soap and water. This will help keep you from getting an infection. If you cannot use soap and water, use hand sanitizer.  During flu season, avoid areas that are crowded with people. General instructions  Drink enough fluid to keep your pee (urine) clear or pale yellow. Do not do this if your doctor has told you not to.  Use a cool mist machine (vaporizer).  If you use oxygen or a machine that turns medicine into a mist (nebulizer), continue to use it as told.  Follow all instructions for  rehabilitation. These are steps you can take to make your body work better.  Keep all follow-up visits as told by your doctor. This is important. Contact a doctor if:  Your COPD symptoms get worse than normal. Get help right away if:  You are short of breath and it gets worse.  You have trouble talking.  You have chest pain.  You cough up blood.  You have a fever.  You keep throwing up (vomiting).  You feel weak or you pass out (faint).  You feel confused.  You are not able to sleep because of your symptoms.  You are not able to do daily activities. Summary  COPD exacerbations are times that breathing gets worse and you need more treatment than normal.  COPD exacerbations can be very serious and may cause your lungs to become more damaged.  Do not smoke. If you need help quitting, ask your doctor.  Stay up-to-date on your shots. Get a flu shot every year. This information is not intended to replace advice given to you by your health care provider. Make sure you discuss any questions you have with your health care provider. Document Released: 01/31/2011 Document Revised: 01/24/2017 Document Reviewed: 03/18/2016 Elsevier Patient Education  2020 [place] Risks of Smoking Smoking cigarettes is very bad for your health. Tobacco smoke has over 200 known poisons in it. It contains the poisonous gases nitrogen oxide and carbon monoxide. There are over 60 chemicals in tobacco smoke that cause cancer. Smoking is difficult to quit because a chemical in tobacco, called nicotine, causes addiction or dependence. When you smoke and inhale, nicotine is absorbed rapidly into the bloodstream through your lungs. Both inhaled and non-inhaled nicotine may be addictive. What are the risks of cigarette smoke? Cigarette smokers have an increased risk of many serious medical problems, including:  Lung cancer.  Lung disease, such as pneumonia, bronchitis, and emphysema.  Chest  pain (angina) and heart attack because the heart is not getting enough oxygen.  Heart disease  and peripheral blood vessel disease.  High blood pressure (hypertension).  Stroke.  Oral cancer, including cancer of the lip, mouth, or voice box.  Bladder cancer.  Pancreatic cancer.  Cervical cancer.  Pregnancy complications, including premature birth.  Stillbirths and smaller newborn babies, birth defects, and genetic damage to sperm.  Early menopause.  Lower estrogen level for women.  Infertility.  Facial wrinkles.  Blindness.  Increased risk of broken bones (fractures).  Senile dementia.  Stomach ulcers and internal bleeding.  Delayed wound healing and increased risk of complications during surgery.  Even smoking lightly shortens your life expectancy by several years. Because of secondhand smoke exposure, children of smokers have an increased risk of the following:  Sudden infant death syndrome (SIDS).  Respiratory infections.  Lung cancer.  Heart disease.  Ear infections. What are the benefits of quitting? There are many health benefits of quitting smoking. Here are some of them:  Within days of quitting smoking, your risk of having a heart attack decreases, your blood flow improves, and your lung capacity improves. Blood pressure, pulse rate, and breathing patterns start returning to normal soon after quitting.  Within months, your lungs may clear up completely.  Quitting for 10 years reduces your risk of developing lung cancer and heart disease to almost that of a nonsmoker.  People who quit may see an improvement in their overall quality of life. How do I quit smoking?     Smoking is an addiction with both physical and psychological effects, and longtime habits can be hard to change. Your health care provider can recommend:  Programs and community resources, which may include group support, education, or talk therapy.  Prescription medicines to  help reduce cravings.  Nicotine replacement products, such as patches, gum, and nasal sprays. Use these products only as directed. Do not replace cigarette smoking with electronic cigarettes, which are commonly called e-cigarettes. The safety of e-cigarettes is not known, and some may contain harmful chemicals.  A combination of two or more of these methods. Where to find more information  American Lung Association: www.lung.org  American Cancer Society: www.cancer.org Summary  Smoking cigarettes is very bad for your health. Cigarette smokers have an increased risk of many serious medical problems, including several cancers, heart disease, and stroke.  Smoking is an addiction with both physical and psychological effects, and longtime habits can be hard to change.  By stopping right away, you can greatly reduce the risk of medical problems for you and your family.  To help you quit smoking, your health care provider can recommend programs, community resources, prescription medicines, and nicotine replacement products such as patches, gum, and nasal sprays. This information is not intended to replace advice given to you by your health care provider. Make sure you discuss any questions you have with your health care provider. Document Released: 03/21/2004 Document Revised: 05/15/2017 Document Reviewed: 02/16/2016 Elsevier Patient Education  2020 [employer].

## 2018-11-06 NOTE — Assessment & Plan Note (Signed)
Plan: Continue oxygen therapy as prescribed 

## 2018-11-06 NOTE — Assessment & Plan Note (Signed)
Plan: Continue Symbicort 160 Continue Combivent Continue albuterol nebulized meds every 6-8 hours as needed for shortness of breath or wheezing Prednisone taper today Z-Pak today Need to keep close follow-up appointment with our office next week [facility] work with [facility] on medication cost  Emphasized repeatedly to the patient that she needs to stop smoking

## 2018-11-06 NOTE — Assessment & Plan Note (Signed)
Likely COPD exacerbation today Patient has high frequency exacerbated  Plan: Prednisone taper today Azithromycin today  Emphasized the need for the patient to stop smoking Patient also may need to be a candidate for vitamin D supplementation Patient also may need to be a candidate for Monday Wednesday Friday azithromycin to help with exacerbation rates

## 2018-11-06 NOTE — Assessment & Plan Note (Signed)
Plan: Continue follow-up with lung cancer screening clinic in October/2020

## 2018-11-06 NOTE — Assessment & Plan Note (Signed)
This is a large part of the patient's problem.  Patient has extensive tobacco use.  Patient continues to smoke 1 pack/day despite all of her worsened acute symptoms and problems breathing.  Plan: Patient was counseled on smoking cessation today for 11 minutes Patient is interested in Chantix I instructed the patient to contact her insurance company as well as the pharmacy to figure out what her out-of-pocket cost would be for Chantix.  That way this can be further evaluated and discussed at office visit with Eric Form next week

## 2018-11-06 NOTE — Telephone Encounter (Signed)
I have already spoke with the pharmacy team about her. I faxed those forms to both companies and called to check status and they state they never received them. I spoke to pt explaining this to her and advised her that the pharmacy team would handle this from here. I spoke with the pharmacy team and they do have her referral and should be contacting her soon.

## 2018-11-06 NOTE — Progress Notes (Signed)
Virtual Visit via Telephone Note  I connected with Joan [name] on 11/06/18 at  3:30 PM EDT by telephone and verified that I am speaking with the correct person using two identifiers.  Location: Patient: Home Provider: Office [facility] - [id number], [place], [place], [place] [address]   I discussed the limitations, risks, security and privacy concerns of performing an evaluation and management service by telephone and the availability of in person appointments. I also discussed with the patient that there may be a patient responsible charge related to this service. The patient expressed understanding and agreed to proceed.  Patient consented to consult via telephone: Yes People present and their role in pt care: Pt     History of Present Illness:  58 year old female current every day smoker followed in our office for COPD  Past medical history: Anxiety, hypertension, dyslipidemia, osteoarthritis Smoking history: Current everyday smoker.  1 pack/day.  44-pack-year smoking history. Maintenance: Symbicort 160, [place] Patient of Dr. Elsworth Soho  Chief complaint: Shortness of breath, current every day smoker   58 year old female current every day smoker followed in our office for COPD.  Patient continues to smoke 1 pack/day.  Patient has history of frequent exacerbations.  She is managed on Symbicort 160, Combivent which she takes twice daily,.aspirin, albuterol nebs which she takes twice daily.  Patient also has continued difficulties with affording her medications.  Patient reports that she is had worsened breathing over the last 3 weeks.  As well as increased dyspnea.  Patient has audible wheezing.  Patient has a productive cough with white mucus.  [place] a COPD exacerbation with azithromycin and prednisone.  Patient is interested in stopping smoking and trying Chantix.  She is unsure if her insurance will  cover the cost of Chantix.  She has not attempted to contact them.  See smoking assessment and cessation counseling listed below:  Smoking assessment and cessation counseling  Patient currently smoking: 1 I have advised the patient to quit/stop smoking as soon as possible due to high risk for multiple medical problems.  It will also be very difficult for [place] to manage patient's  respiratory symptoms and status if we continue to expose her lungs to a known irritant.  We do not advise e-cigarettes as a form of stopping smoking.  Patient is not willing to quit smoking.  I have advised the patient that we can assist and have options of nicotine replacement therapy, provided smoking cessation education today, provided smoking cessation counseling, and provided cessation resources.   Follow-up next office visit office visit for assessment of smoking cessation.  Smoking cessation counseling advised for: 11 min   Observations/Objective:  11/06/2018 - CBC Diff - Eos 5.5, eos absolute 0.6  NPSG 02/2017 >> no OSA HST 04/2017 15/h  11/2017 CT Chest  Stable 4 mm right upper lobe nodule and 2 mm left upper lobe nodule. A previously demonstrated 3 mm left upper lobe nodule is no longer seen. No follow-up needed if patient is low-risk (and has no known or suspected primary neoplasm). Non-contrast chest CT can be considered in 12 months if patient is high-risk. This recommendation follows the consensus statement: Guidelines for Management of Incidental Pulmonary Nodules Detected on CT Images: From the Fleischner Society 2017; Radiology 2017; 284:228-243. Changes of COPD with centrilobular emphysema. Calcific coronary artery and aortic atherosclerosis.  10/2016 PFTs showed ratio 49, FEV1 of 36%, FVC 58% and DLCO of 34%  consistent with severe obstruction.  11/2016 CT chestbullous changes BL, 58mmRUl &2 mm LULnodules >>stable 11/2017  09/2016 echo normal  Assessment and Plan:  COPD,  severe (HCC) Plan: Continue Symbicort 160 Continue Combivent Continue albuterol nebulized meds every 6-8 hours as needed for shortness of breath or wheezing Prednisone taper today Z-Pak today Need to keep close follow-up appointment with our office next week [facility] work with patient on medication cost  Emphasized repeatedly to the patient that she needs to stop smoking  Chronic respiratory failure with hypoxia ([place]) Plan: Continue oxygen therapy as prescribed  OSA on CPAP Plan: Continue CPAP therapy  COPD exacerbation ([place]) Likely COPD exacerbation today Patient has high frequency exacerbated  Plan: Prednisone taper today Azithromycin today  Emphasized the need for the patient to stop smoking Patient also may need to be a candidate for vitamin D supplementation Patient also may need to be a candidate for Monday Wednesday Friday azithromycin to help with exacerbation rates  Pulmonary nodule Plan: Continue follow-up with lung cancer screening clinic in October/2020  Tobacco abuse This is a large part of the patient's problem.  Patient has extensive tobacco use.  Patient continues to smoke 1 pack/day despite all of her worsened acute symptoms and problems breathing.  Plan: Patient was counseled on smoking cessation today for 11 minutes Patient is interested in Chantix I instructed the patient to contact her insurance company as well as the pharmacy to figure out what her out-of-pocket cost would be for Chantix.  That way this can be further evaluated and discussed at office visit with Eric Form next week   Medication management Patient continues to struggle with medication adherence as well as medication cost.  Plan: [facility]  They can work with the patient regarding COPD and symptom management as well as work with medication cost.  Patient likely will need a pharmacy referral to work with  them on covering cost of her medications.  Follow Up Instructions:  Return in about 3 days (around 11/09/2018), or if symptoms worsen or fail to improve, for Follow up with Eric Form ACAGNP-BC.   I discussed the assessment and treatment plan with the patient. The patient was provided an opportunity to ask questions and all were answered. The patient agreed with the plan and demonstrated an understanding of the instructions.   The patient was advised to call back or seek an in-person evaluation if the symptoms worsen or if the condition fails to improve as anticipated.  I provided 23 minutes of non-face-to-face time during this encounter.   [name], NP

## 2018-11-06 NOTE — Telephone Encounter (Signed)
Spoke with pt, she is requesting prednisone be sent to for a possible COPD exacerbation. She is having more SOB but doesn't have fever, chills, congestion, or fever. She is not on oxygen and doesn't have any other symptoms. She is using her inhalers as prescribed and using her nebulizers. She states the prednisone always help when she feels like she will have an exacerbation and tries to prevent going to the emergency room. Please advise if we can send prednisone in.   Patient Instructions by [name], NP at 08/24/2018 1:45 PM Author: [name], NP Author Type: Nurse Practitioner Filed: 08/24/2018 2:23 PM  Note Status: Addendum Cosign: Cosign Not Required Encounter Date: 08/24/2018  Editor: [name], NP (Nurse Practitioner)  Prior Versions: 1. [name], NP (Nurse Practitioner) at 08/24/2018 2:16 PM - Addendum   2. [name], NP (Nurse Practitioner) at 08/24/2018 2:13 PM - Addendum   3. [name], NP (Nurse Practitioner) at 08/24/2018 2:04 PM - Signed    It is good to see you today.  We will have Jonelle Sidle call you about your financial aid information. Continue your Symbicort and Combivent as you have been doing Continue your Singulair and Daliresp as you have been doing. Continue using your rescue nebs prn.  We will have Kathlee Nations, our Billing office call you about over billing. Please follow up with your PCP this week about dizziness and lower blood pressure.( Call for an appointment today)  Please make sure you are using a counter top to prevent falls when you are dizzy. Continue wearing your night time oxygen at 2 L every night, and with exertion. Saturation goals are 88-92% You must quit smoking.  Do not wear oxygen while smoking. Follow up with Dr. Elsworth Soho or Judson Roch NP in 3 months. Please contact office for sooner follow up if symptoms do not improve or worsen or seek emergency care  Note your daily symptoms >remember "red flags" for COPD: Increase in cough, increase  in sputum production, increase in shortness of breath or activity intolerance. If you notice these symptoms, please call to be seen.  Please try to remain active and walk as much as you possibly can. Congratulations on your weight loss.  Keep it up.

## 2018-11-06 NOTE — Assessment & Plan Note (Signed)
Plan: Continue CPAP therapy 

## 2018-11-06 NOTE — Assessment & Plan Note (Signed)
Patient continues to struggle with medication adherence as well as medication cost.  Plan: [facility] will [facility]  They can work with the patient regarding COPD and symptom management as well as work with medication cost.  Patient likely will need a pharmacy referral to work with them on covering cost of her medications.

## 2018-11-06 NOTE — Telephone Encounter (Signed)
Needs televisit with APP to further evaluate.   Joan [name]

## 2018-11-09 ENCOUNTER — Ambulatory Visit (INDEPENDENT_AMBULATORY_CARE_PROVIDER_SITE_OTHER): Payer: Medicare Other | Admitting: Acute Care

## 2018-11-09 ENCOUNTER — Other Ambulatory Visit: Payer: Self-pay

## 2018-11-09 ENCOUNTER — Other Ambulatory Visit: Payer: Self-pay | Admitting: *Deleted

## 2018-11-09 ENCOUNTER — Encounter: Payer: Self-pay | Admitting: Acute Care

## 2018-11-09 ENCOUNTER — Ambulatory Visit (INDEPENDENT_AMBULATORY_CARE_PROVIDER_SITE_OTHER): Payer: Medicare Other

## 2018-11-09 VITALS — BP 124/68 | HR 71 | Ht 68.0 in | Wt 197.0 lb

## 2018-11-09 DIAGNOSIS — J439 Emphysema, unspecified: Secondary | ICD-10-CM | POA: Diagnosis not present

## 2018-11-09 DIAGNOSIS — Z23 Encounter for immunization: Secondary | ICD-10-CM | POA: Diagnosis not present

## 2018-11-09 DIAGNOSIS — J9611 Chronic respiratory failure with hypoxia: Secondary | ICD-10-CM

## 2018-11-09 DIAGNOSIS — G4733 Obstructive sleep apnea (adult) (pediatric): Secondary | ICD-10-CM

## 2018-11-09 DIAGNOSIS — J449 Chronic obstructive pulmonary disease, unspecified: Secondary | ICD-10-CM

## 2018-11-09 DIAGNOSIS — Z9989 Dependence on other enabling machines and devices: Secondary | ICD-10-CM

## 2018-11-09 DIAGNOSIS — J441 Chronic obstructive pulmonary disease with (acute) exacerbation: Secondary | ICD-10-CM | POA: Diagnosis not present

## 2018-11-09 DIAGNOSIS — Z720 Tobacco use: Secondary | ICD-10-CM

## 2018-11-09 MED ORDER — DALIRESP 250 MCG PO TABS: 2.0000 | ORAL_TABLET | Freq: Every day | ORAL | 0 refills | Status: DC

## 2018-11-09 MED ORDER — CHANTIX STARTING MONTH PAK 0.5 MG X 11 & 1 MG X 42 PO TABS: ORAL_TABLET | ORAL | 0 refills | Status: DC

## 2018-11-09 NOTE — Progress Notes (Signed)
History of Present Illness Joan [name] is a 58 y.o. female current every day smoker with COPD.   Smoking history: Current everyday smoker.  1 pack/day.  44-pack-year smoking history. Maintenance: Symbicort 160,  Daliresp Nebulizer >> Albuterol  Patient of Dr. Elsworth Soho  11/09/2018 OV for follow up COPD Flare and Surgical Clearance for Hip Surgery  Pt. Presents for follow up of COPD Flare. She was seen via telephone visit 11/06/2018 for worsening shortness of breath. She requested an antibiotic and prednisone taper at that time. She was prescribed a prednisone taper and a z pack. She started taking her prednisone and antibiotic on Sunday. She was unable to get to the pharmacy until Sunday, which is the reason for the reason for the 2 day delay in treatment after prescription was sent on 9/11.  . She is on day 2 of therapy now. She states her secretions have been white and occasionally yellow. She denies any fever during this time. She did have a COPD exacerbation October 03, 2018 which required an ED visit while she was visiting in [place].. Her ED MD wanted to admit her but she refused admission as she was there for her sons wedding. She was treated with Solumedrol IV  and Levaquin IV x 1 and sent home with a prednisone taper and oral Levaquin . She had a CXR while there and she was told she had a black spot on her lung . She is wearing her CPAP and she is using her oxygen blended into her CPAP at night.  She states she has been having worsening shortness of breath. She states she is unable to use her Simply Go Mini any more as the amount of oxygen it provides is not adequate. She has continued to smoke despite the fact her breathing is worse. She has been on the prednisone x 2 days. She has been on a z pack x 2 days. I have counseled extensively that [name] needs to quit smoking. We talked for greater than 10 minutes. She has used Chantix twice in the past. She actually was able to quit smoking x 2 months  the last time. She states that urges are the reason she re-started smoking.  She is schedule to have hip surgery 11/26/2018. I will let her MD know that she continues to smoke 1 PPD as of 11/09/2018 and that she has had 2 flares in the last 2 months, and has been experiencing worsening dyspnea with exertion.She is currently being treated with prednisone and antibiotics.  I cannot clear her for surgery until she recovers from the current flare, as she would be a high risk for pulmonary complications. . Per the clearance paperwork she was supposed to quit smoking 4 weeks prior to surgery.We will refer her to clinic pharmacy for smoking cessation counseling and counseling on proper use of Chantix as she was successful quitting with the last treatment  For 2 months.   She is in a wheel chair today. When I asked her if this was because of her hip or her breathing, she states it was due to both.  She denies fever, chest pain, orthopnea or hemoptysis.  Test Results: 11/06/2018 - CBC Diff - Eos 5.5, eos absolute 0.6  11/09/2018 CXR FINDINGS: The chest is hyperexpanded with attenuation of the pulmonary vasculature consistent with emphysema. Lungs are clear. No pneumothorax or pleural fluid. Heart size is normal. Emphysema without acute disease.  NPSG 02/2017 >> no OSA HST 04/2017 15/h  11/2017 CT  Chest Stable 4 mm right upper lobe nodule and 2 mm left upper lobe nodule. A previously demonstrated 3 mm left upper lobe nodule is no longer seen. No follow-up needed if patient is low-risk (and has no known or suspected primary neoplasm). Non-contrast chest CT can be considered in 12 months if patient is high-risk. This recommendation follows the consensus statement: Guidelines for Management of Incidental Pulmonary Nodules Detected on CT Images: From the Fleischner Society 2017; Radiology 2017; 284:228-243. Changes of COPD with centrilobular emphysema. Calcific coronary artery and aortic  atherosclerosis.  10/2016 PFTs showed ratio 49, FEV1 of 36%, FVC 58% and DLCO of 34% consistent with severe obstruction.  11/2016 CT chestbullous changes BL, 64mRUl &2 mm LULnodules >>stable 11/2017  09/2016 echo normal   CBC Latest Ref Rng & Units 05/24/2018 05/23/2018 05/22/2018  WBC 4.0 - 10.5 K/uL 17.6(H) 17.7(H) 21.3(H)  Hemoglobin 12.0 - 15.0 g/dL 12.4 12.9 12.7  Hematocrit 36.0 - 46.0 % 39.8 40.5 38.8  Platelets 150 - 400 K/uL 308 328 306    BMP Latest Ref Rng & Units 05/24/2018 05/22/2018 05/21/2018  Glucose 70 - 99 mg/dL 113(H) 251(H) 322(H)  BUN 6 - 20 mg/dL 27(H) 22(H) 14  Creatinine 0.44 - 1.00 mg/dL 1.14(H) 1.20(H) 1.28(H)  Sodium 135 - 145 mmol/L 140 132(L) 132(L)  Potassium 3.5 - 5.1 mmol/L 3.9 4.3 3.3(L)  Chloride 98 - 111 mmol/L 105 95(L) 91(L)  CO2 22 - 32 mmol/L 24 25 25   Calcium 8.9 - 10.3 mg/dL 9.2 9.4 9.1    BNP    Component Value Date/Time   BNP 114.5 (H) 12/29/2017 0609    ProBNP No results found for: PROBNP  PFT    Component Value Date/Time   FEV1PRE 1.05 10/30/2016 0847   FEV1POST 1.23 10/30/2016 0847   FVCPRE 2.16 10/30/2016 0847   FVCPOST 2.53 10/30/2016 0847   TLC 5.36 10/30/2016 0847   DLCOUNC 9.40 10/30/2016 0847   PREFEV1FVCRT 49 10/30/2016 0847   PSTFEV1FVCRT 49 10/30/2016 0847    No results found.   Past medical hx Past Medical History:  Diagnosis Date  . Acute respiratory failure with hypoxia ([facility]   . Alcoholism and drug addiction in family   . Anxiety   . Arthritis    "knees, hips, lower back" (09/25/2016)  . Asthma   . Bipolar disorder ([facility] [place]   . Chronic bronchitis ([facility]    "all the time" (09/25/2016)  . Chronic leg pain    RLE  . Chronic lower back pain   . COPD (chronic obstructive pulmonary disease) ([facility]   . Depression   . Emphysema of lung ([facility]   . GERD (gastroesophageal reflux disease)   . Headache   . Hepatitis C    "treated back in 2001-2002"  . High cholesterol   . Hypertension   . Hypothyroidism    . Incarcerated ventral hernia 04/04/2017  . On home oxygen therapy    "2L; at nighttime" (04/04/2017)  . OSA on CPAP   . Pneumonia    "all the time" (09/25/2016)  . Positive PPD    with negative chest x ray  . Tachycardia    patient reports with exertion ; denies any other conditions   . Thyroid disease      Social History   Tobacco Use  . Smoking status: Current Every Day Smoker    Packs/day: 1.00    Years: 44.00    Pack years: 44.00    Types: Cigarettes  . Smokeless tobacco: Never Used  .  Tobacco comment: Smoker 1-1.5 PPD every day  Substance Use Topics  . Alcohol use: Yes  . Drug use: Yes    Types: "Crack" cocaine    Comment: 04/04/2017 "nothing since 01/12/2012"    Ms.Broxterman reports that she has been smoking cigarettes. She has a 44.00 pack-year smoking history. She has never used smokeless tobacco. She reports current alcohol use. She reports current drug use. Drug: "Crack" cocaine.  Tobacco Cessation: Current every day smoker with 1.5 PPD smoking history She denies any crack cocaine use for the last 5-6 years.  Past surgical hx, Family hx, Social hx all reviewed.  Current Outpatient Medications on File Prior to Visit  Medication Sig  . acyclovir (ZOVIRAX) 400 MG tablet Take 1 tablet (400 mg total) by mouth 2 (two) times daily.  Marland Kitchen albuterol (PROVENTIL) (2.5 MG/3ML) 0.083% nebulizer solution Take 3 mLs (2.5 mg total) by nebulization 2 (two) times daily. And every 6 hours as needed.  DX: J44.9  . atenolol (TENORMIN) 25 MG tablet Take 1 tablet (25 mg total) by mouth daily.  Marland Kitchen atorvastatin (LIPITOR) 20 MG tablet Take 1 tablet (20 mg total) by mouth daily.  Marland Kitchen azithromycin (ZITHROMAX) 250 MG tablet '500mg'$  (two tablets) today, then '250mg'$  (1 tablet) for the next 4 days  . baclofen (LIORESAL) 10 MG tablet Take 10 mg by mouth at bedtime.  . budesonide-formoterol (SYMBICORT) 160-4.5 MCG/ACT inhaler Inhale 2 puffs into the lungs 2 (two) times daily.  Marland Kitchen buPROPion (WELLBUTRIN XL) 150  MG 24 hr tablet TAKE 1 TABLET BY MOUTH  DAILY  . cyclobenzaprine (FLEXERIL) 10 MG tablet Take 1 tablet (10 mg total) by mouth at bedtime.  . diphenhydrAMINE (BENADRYL) 25 MG tablet Take 25 mg by mouth 2 (two) times daily.   Marland Kitchen gabapentin (NEURONTIN) 300 MG capsule TAKE 1 CAPSULE BY MOUTH  TWICE DAILY  . glucose blood (ONE TOUCH ULTRA TEST) test strip Use to check blood sugars twice a day  . hydrochlorothiazide (HYDRODIURIL) 25 MG tablet Take 1 tablet (25 mg total) by mouth daily.  . Ipratropium-Albuterol (COMBIVENT RESPIMAT) 20-100 MCG/ACT AERS respimat Inhale 1 puff into the lungs every 6 (six) hours as needed for wheezing. (Patient taking differently: Inhale 1 puff into the lungs every 6 (six) hours. )  . levofloxacin (LEVAQUIN) 500 MG tablet Take 1 tablet (500 mg total) by mouth daily.  Marland Kitchen levothyroxine (SYNTHROID, LEVOTHROID) 25 MCG tablet Take 1 tablet (25 mcg total) by mouth daily before breakfast.  . montelukast (SINGULAIR) 10 MG tablet Take 1 tablet (10 mg total) by mouth at bedtime.  . Na Sulfate-K Sulfate-Mg Sulf 17.5-3.13-1.6 GM/177ML SOLN Take 1 kit by mouth as directed.  . naproxen (NAPROSYN) 500 MG tablet Take 1 tablet (500 mg total) by mouth 2 (two) times daily with a meal.  . nystatin-triamcinolone ointment (MYCOLOG) Apply 1 application topically 2 (two) times daily as needed (applied to skin folds/groins/under breast skin irritation rash.).  Marland Kitchen OXYGEN Inhale 2 L into the lungs daily as needed (strenous activity).   . pantoprazole (PROTONIX) 40 MG tablet Take 1 tablet (40 mg total) by mouth daily.  . predniSONE (DELTASONE) 10 MG tablet 4 tabs for 2 days, then 3 tabs for 2 days, 2 tabs for 2 days, then 1 tab for 2 days, then stop  . roflumilast (DALIRESP) 500 MCG TABS tablet Take 1 tablet (500 mcg total) by mouth daily.  . traZODone (DESYREL) 100 MG tablet Take 1 tablet (100 mg total) by mouth at bedtime.  Marland Kitchen venlafaxine XR (  EFFEXOR-XR) 150 MG 24 hr capsule TAKE 1 CAPSULE DAILY WITH   BREAKFAST (Patient taking differently: Take 150 mg by mouth daily with breakfast. )  . SYMBICORT 160-4.5 MCG/ACT inhaler Inhale 2 puffs into the lungs 2 (two) times daily.   No current facility-administered medications on file prior to visit.      Allergies  Allergen Reactions  . Keflex [Cephalexin] Other (See Comments)    unspecified  . Prednisone Other (See Comments)    "counter reacts"  . Shellfish Allergy Other (See Comments)    Stomach cramps  . Tetracyclines & Related Other (See Comments)    unspecified  . Dilaudid [Hydromorphone Hcl] Other (See Comments)    "Lethargy"  . Incruse Ellipta [Umeclidinium Bromide] Nausea Only  . Tuberculin Tests Other (See Comments)    Review Of Systems:  Constitutional:   No  weight loss, night sweats,  Fevers, chills, fatigue, or  lassitude.  HEENT:   No headaches,  Difficulty swallowing,  Tooth/dental problems, or  Sore throat,                No sneezing, itching, ear ache, nasal congestion, post nasal drip,   CV:  No chest pain,  Orthopnea, PND, swelling in lower extremities, anasarca, dizziness, palpitations, syncope.   GI  No heartburn, indigestion, abdominal pain, nausea, vomiting, diarrhea, change in bowel habits, loss of appetite, bloody stools.   Resp: + shortness of breath with exertion or at rest.  No excess mucus, + productive cough,  No non-productive cough,  No coughing up of blood.  No change in color of mucus.  No wheezing.  No chest wall deformity  Skin: no rash or lesions.  GU: no dysuria, change in color of urine, no urgency or frequency.  No flank pain, no hematuria   MS:  No joint pain or swelling.  No decreased range of motion.  No back pain.  Psych:  No change in mood or affect. No depression or anxiety.  No memory loss.   Vital Signs BP 124/68 (BP Location: Left Arm, Cuff Size: Normal)   Pulse 71   Ht 5' 8"  (1.727 m)   Wt 197 lb (89.4 kg)   SpO2 96%   BMI 29.95 kg/m    Physical Exam:  General- No  distress,  A&Ox3, pleasant  ENT: No sinus tenderness, TM clear, pale nasal mucosa, no oral exudate,no post nasal drip, no LAN Cardiac: S1, S2, regular rate and rhythm, no murmur Chest: No wheeze/ rales/ dullness; no accessory muscle use, no nasal flaring, no sternal retractions Abd.: Soft Non-tender, ND, BS +, Body mass index is 29.95 kg/m. Ext: No clubbing cyanosis, edema Neuro:  Deconditioned at baseline, MAE x 4. A&O x 3, appropriate, states she has issues remembering things Skin: No rashes, no lesions, warm and dry Psych: normal mood and behavior   Assessment/Plan  No problem-specific Assessment & Plan notes found for this encounter.  COPD, severe ([place]) Recent Flare 11/06/2018 Televisit with Wyn Quaker NP.  Plan: Continue Symbicort 160 Continue Combivent Continue albuterol nebulized meds every 6-8 hours as needed for shortness of breath or wheezing Complete Prednisone taper as prescribed Complete Z-Pak as prescribed Follow up appointment  [facility] to work with patient on medication cost Stop smoking Follow up in 3 weeks with Judson Roch NP  Chronic respiratory failure with hypoxia ([place]) Plan: Continue oxygen therapy as prescribed  OSA on CPAP Plan: Continue CPAP therapy  COPD exacerbation (HCC) COPD exacerbation 2 flares in the  last month Plan: Prednisone taper  Azithromycin We will recommend re-scheduling her surgery until flare is resolved Continue Daliresp Emphasized the need for the patient to stop smoking Will consider Vitamin D supplementation at follow up Consider  Monday Wednesday Friday azithromycin to help with exacerbation rates  Pulmonary nodule Plan: Continue follow-up with lung cancer screening clinic in October/2020  Tobacco abuse This is the biggest component of patient's breathing issues. Patient has extensive tobacco use.  Patient continues to smoke 1 pack/day despite all of her worsened acute symptoms and  problems breathing. Plan: Patient was counseled on smoking cessation today for 10 minutes Chantix has been prescribed She will be scheduled for smoking cessation counseling I instructed the patient to contact her insurance company as well as the pharmacy to figure out what her out-of-pocket cost would be for Chantix.     Medication management Patient continues to struggle with medication adherence as well as medication cost.  Plan: [facility].  They can work with the patient regarding COPD and symptom management as well as work with medication cost.  Patient likely will need a pharmacy referral to work with them on covering cost of her medications.    [name], NP 11/09/2018  11:39 AM

## 2018-11-09 NOTE — Patient Outreach (Signed)
[facility]) Care Management  11/09/2018  REDA PIWOWARSKI [birth year] [id number]   Referral Date: 11/06/2018 Referral Source: MD office (pulmonology) Referral Reason: Help with medication cost Insurance: [facility]   Outreach attempt #1, unsuccessful.  Call placed to member for telephone screening, no answer/rang busy, unable to leave a message.  Plan: RN CM will send outreach letter and follow up within the next 3-4 business days.  Valente David, [place], MSN [place] [phone]

## 2018-11-09 NOTE — Patient Instructions (Addendum)
It is good to see you today. We will do a CXR today We will call you with results. We will refer you for lung cancer screening. Finish your current prednisone taper Finish antibiotic as was prescribed I am going to recommend that we postpone your surgery until you have recovered from your current flare. You will then need to be smoke free x 4 weeks prior to surgery per the orthopedic MD's note We will refer you to the Pharmacist here in the office on Wednesday for smoking cessation counseling  Ambulatory referral to clinical pharmacist for smoking cessation counseling. You should get a call with the time of your appointment  We will prescribe a Chantix starter pack  Please discuss with pharmacist prior to starting this wednesday Take as prescribed. You have got to stop smoking as your pulmonary condition has deteriorated over the last few months with flares.  Please call the Quit Now number on the card I gave you today. Follow up appointment in 3 weeks to reassess how you are doing with smoking cessation and to see if you flare is improving. Flu vaccine today.  We will give you samples of Daliresp today. Please contact office for sooner follow up if symptoms do not improve or worsen or seek emergency care

## 2018-11-10 ENCOUNTER — Ambulatory Visit: Payer: Medicare Other | Admitting: Cardiovascular Disease

## 2018-11-10 NOTE — Telephone Encounter (Signed)
Patient sent email today stating she has not heard from the smoking sensation people and was told to let Judson Roch know. Patient states she believes the pharmacy or counselors have been contacted multiple times and still no follow up.    Does our pharmacy do this kind of counseling?   [name] please advise

## 2018-11-11 ENCOUNTER — Telehealth: Payer: Self-pay

## 2018-11-11 ENCOUNTER — Ambulatory Visit: Payer: Medicare Other | Admitting: Cardiovascular Disease

## 2018-11-11 DIAGNOSIS — Z720 Tobacco use: Secondary | ICD-10-CM

## 2018-11-11 MED ORDER — CHANTIX STARTING MONTH PAK 0.5 MG X 11 & 1 MG X 42 PO TABS: ORAL_TABLET | ORAL | 0 refills | Status: DC

## 2018-11-11 NOTE — Telephone Encounter (Signed)
Called patient to discuss smoking cessation and Chantix. Patient stated that the copay for Chantix was unaffordable and therefore did not pick up her prescription. She stated that she was already working with [place] on other patient assistance programs and has given financial documents to her. Recommend working with Wallene Dales to also fill our PAP for Chantix [employer]).   Also discussed other smoking cessation therapies with the patient. She denied efficacy with nicotine patches and stated that nicotine gum irritates her throat. The patient is currently taking bupropion 150 mg daily for smoking cessation prescribed by PCP. I discussed with the patient that this was not the max dose for smoking cessation and recommended increasing the dose 150 mg BID if she can tolerate. I do not believe the patient has contraindications to dose increase. The patient stated that she would be willing to increase while we work on Chantix access. Patient also stated that she was supposed to have in-person smoking cessation counseling with pharmacy team at [facility] clinic, and asked if we could add her to the pharmacist's schedule. Scheduled for 11/13/18 at 11am. Dr. Maudie Mercury agrees with bupropion increase, please send new prescription and counsel patient on change during appointment on 11/13/18.   Brenton Grills, Student-PharmD 11/11/2018 3:21 PM

## 2018-11-11 NOTE — Telephone Encounter (Signed)
Called and spoke to patient.  Patient wanted to talk about her upcoming appointments.  Patient has appointment for follow up with Eric Form on 11/30/2018, CT scan on 12/15/2018.  Patient wanted to know if she should have CT prior to OV or should she come to OV and then be called with results?   Reminded patient to also check her voicemail because the pharmacy team has tried to contact her about smoking cessation.  Patient stated she would call them back.  [name] please advise. Thank you!

## 2018-11-11 NOTE — Telephone Encounter (Signed)
LVM for patient to discuss smoking cessation.  Brenton Grills, Student-PharmD 11/11/2018 11:05 AM

## 2018-11-11 NOTE — Telephone Encounter (Signed)
Delsa Sale if you will please meet with patient for smoking cessation.  Thank you!

## 2018-11-11 NOTE — Telephone Encounter (Signed)
Called and spoke to patient. Let her know that Eric Form, NP would like to see her as planned and we will call her with the results of CT following that scan.  Nothing further needed at this time.

## 2018-11-11 NOTE — Telephone Encounter (Signed)
Our pharmacy team does do smoking sensation. Didn't know if you wanted an appointment with them, or this was a specific counselor you have set up for patients.

## 2018-11-12 ENCOUNTER — Other Ambulatory Visit: Payer: Self-pay | Admitting: *Deleted

## 2018-11-12 DIAGNOSIS — J449 Chronic obstructive pulmonary disease, unspecified: Secondary | ICD-10-CM

## 2018-11-12 NOTE — Patient Outreach (Signed)
[facility]) Care Management  11/12/2018  MALACHI ALDERETE 08-Aug-1960 [id number]   Referral Date: 11/06/2018 Referral Source: MD office (pulmonology) Referral Reason: Help with medication cost Insurance: [facility]   Outreach attempt #2, unsuccessful.  Call placed to member for telephone screening, no answer/rang busy (3 attempts), unable to leave a message.  Plan: RN CM will follow up within the next 3-4 business days.  Valente David, [place], MSN [place] [phone]

## 2018-11-12 NOTE — Telephone Encounter (Signed)
I would be happy to help with the patient assistance application for the Chantix, however, I will be on vacation starting tomorrow Sept.17th and will not be back until Sept. 28th. If this is something urgent, can you all start this?

## 2018-11-13 ENCOUNTER — Ambulatory Visit (INDEPENDENT_AMBULATORY_CARE_PROVIDER_SITE_OTHER): Payer: Medicare Other | Admitting: Pharmacist

## 2018-11-13 ENCOUNTER — Encounter: Payer: Self-pay | Admitting: *Deleted

## 2018-11-13 ENCOUNTER — Other Ambulatory Visit: Payer: Self-pay

## 2018-11-13 DIAGNOSIS — Z79899 Other long term (current) drug therapy: Secondary | ICD-10-CM

## 2018-11-13 DIAGNOSIS — Z720 Tobacco use: Secondary | ICD-10-CM

## 2018-11-13 DIAGNOSIS — J449 Chronic obstructive pulmonary disease, unspecified: Secondary | ICD-10-CM

## 2018-11-13 MED ORDER — BUPROPION HCL ER (XL) 150 MG PO TB24: 150.0000 mg | ORAL_TABLET | Freq: Two times a day (BID) | ORAL | 3 refills | Status: DC

## 2018-11-13 NOTE — Progress Notes (Signed)
Pharmacy Note  Subjective:   Patient presents to [facility] to meet with the pharmacy team for medication patient assistance and smoking cessation counseling.  She is most concerned about the cost of her medications.  She is currently on Symbicort, Combivent, Daliresp, and albuterol nebulizer.  She was previously enrolled in Medicare extra help low income subsidy but was denied reenrollment due to income.  Patient states her income has not changed.  She is unable to afford her medications now that she does not have Medicare extra help.  She filled out paperwork for patient assistance for her inhalers back in February but they were not submitted as the paperwork was lost.  She is currently working with the clinic on new applications.  Patient was referred by nurse practitioner, Wyn Quaker at [facility] on 11/09/18 for smoking cessation. She has had 2 COPD flares in the last 2 months, and has been experiencing worsening dyspnea with exertion.She is currently being treated with prednisone and antibiotics. Patient is schedule to have hip surgery 11/26/2018. Patient cannot be cleared for her for surgery until she recovers from the current flare, as she would be a high risk for pulmonary complications. Per the clearance paperwork she was supposed to quit smoking 4 weeks prior to surgery. Of note, patient is currently being prescribed bupropion for smoking cessation by a provider at her PCP's office, Scarlette Calico.  Social history: current everyday smoker (1 ppd; 44 pack year smoking hx)  Smoking triggers: getting out of bed and have coffee; cut back during the winter (1/2 pack day), stress  Current smoking cessation meds: bupropion XL 150 mg daily   Prior smoking cessation meds:  -varenicline (Chantix) (3 months trial) -nicotine patches (lack of efficacy) -nicotine gum (irritates her throat)   Objective: Current Outpatient Medications on File Prior to Visit  Medication Sig Dispense  Refill  . acyclovir (ZOVIRAX) 400 MG tablet Take 1 tablet (400 mg total) by mouth 2 (two) times daily. 180 tablet 3  . albuterol (PROVENTIL) (2.5 MG/3ML) 0.083% nebulizer solution Take 3 mLs (2.5 mg total) by nebulization 2 (two) times daily. And every 6 hours as needed.  DX: J44.9 360 mL 3  . atenolol (TENORMIN) 25 MG tablet Take 1 tablet (25 mg total) by mouth daily. 90 tablet 3  . atorvastatin (LIPITOR) 20 MG tablet Take 1 tablet (20 mg total) by mouth daily. 90 tablet 3  . azithromycin (ZITHROMAX) 250 MG tablet 530m (two tablets) today, then 2566m(1 tablet) for the next 4 days 6 tablet 0  . baclofen (LIORESAL) 10 MG tablet Take 10 mg by mouth at bedtime.    . budesonide-formoterol (SYMBICORT) 160-4.5 MCG/ACT inhaler Inhale 2 puffs into the lungs 2 (two) times daily. 2 Inhaler 0  . buPROPion (WELLBUTRIN XL) 150 MG 24 hr tablet TAKE 1 TABLET BY MOUTH  DAILY 90 tablet 0  . cyclobenzaprine (FLEXERIL) 10 MG tablet Take 1 tablet (10 mg total) by mouth at bedtime. 90 tablet 1  . diphenhydrAMINE (BENADRYL) 25 MG tablet Take 25 mg by mouth 2 (two) times daily.     . Marland Kitchenabapentin (NEURONTIN) 300 MG capsule TAKE 1 CAPSULE BY MOUTH  TWICE DAILY 180 capsule 1  . glucose blood (ONE TOUCH ULTRA TEST) test strip Use to check blood sugars twice a day 100 each 12  . hydrochlorothiazide (HYDRODIURIL) 25 MG tablet Take 1 tablet (25 mg total) by mouth daily. 90 tablet 3  . Ipratropium-Albuterol (COMBIVENT RESPIMAT) 20-100 MCG/ACT AERS respimat Inhale 1  puff into the lungs every 6 (six) hours as needed for wheezing. (Patient taking differently: Inhale 1 puff into the lungs every 6 (six) hours. ) 3 Inhaler 3  . levofloxacin (LEVAQUIN) 500 MG tablet Take 1 tablet (500 mg total) by mouth daily. 7 tablet 0  . levothyroxine (SYNTHROID, LEVOTHROID) 25 MCG tablet Take 1 tablet (25 mcg total) by mouth daily before breakfast. 90 tablet 3  . montelukast (SINGULAIR) 10 MG tablet Take 1 tablet (10 mg total) by mouth at bedtime.  90 tablet 3  . Na Sulfate-K Sulfate-Mg Sulf 17.5-3.13-1.6 GM/177ML SOLN Take 1 kit by mouth as directed. 354 mL 0  . naproxen (NAPROSYN) 500 MG tablet Take 1 tablet (500 mg total) by mouth 2 (two) times daily with a meal. 180 tablet 1  . nystatin-triamcinolone ointment (MYCOLOG) Apply 1 application topically 2 (two) times daily as needed (applied to skin folds/groins/under breast skin irritation rash.). 100 g 3  . OXYGEN Inhale 2 L into the lungs daily as needed (strenous activity).     . pantoprazole (PROTONIX) 40 MG tablet Take 1 tablet (40 mg total) by mouth daily. 90 tablet 3  . predniSONE (DELTASONE) 10 MG tablet 4 tabs for 2 days, then 3 tabs for 2 days, 2 tabs for 2 days, then 1 tab for 2 days, then stop 20 tablet 0  . Roflumilast (DALIRESP) 250 MCG TABS Take 2 tablets by mouth daily. 112 tablet 0  . roflumilast (DALIRESP) 500 MCG TABS tablet Take 1 tablet (500 mcg total) by mouth daily. 90 tablet 3  . SYMBICORT 160-4.5 MCG/ACT inhaler Inhale 2 puffs into the lungs 2 (two) times daily. 3 Inhaler 3  . traZODone (DESYREL) 100 MG tablet Take 1 tablet (100 mg total) by mouth at bedtime. 90 tablet 3  . varenicline (CHANTIX STARTING MONTH PAK) 0.5 MG X 11 & 1 MG X 42 tablet Take one 0.5 mg tablet by mouth once daily for 3 days, then increase to one 0.5 mg tablet twice daily for 4 days, then increase to one 1 mg tablet twice daily. 53 tablet 0  . venlafaxine XR (EFFEXOR-XR) 150 MG 24 hr capsule TAKE 1 CAPSULE DAILY WITH  BREAKFAST (Patient taking differently: Take 150 mg by mouth daily with breakfast. ) 90 capsule 3   No current facility-administered medications on file prior to visit.      Assessment/Plan: 1. Medication Assistance: Patient is having difficulty affording her COPD medications.  She has a household of 1 and her annual income is 19,000 a year or she receives $1400 a month from [employer].  She was previously enrolled in Medicare extra help but was denied at reenrollment due to  income.  Patient states that her income has not changed and is unsure why she was denied.  Instructed patient to call Medicare extra help and ask if they have changed the income requirements or if there is any additional paperwork she can submit to be reconsidered.  Patient verbalized understanding.  She has been working with clinic staff on patient assistance paperwork for Symbicort, Combivent, and Daliresp.  In addition to filling out patient assistance paperwork she provided income documents, copy of extra help denial, out-of-pocket prescription cost from [facility] to the clinic.  She has been unable to obtain out-of-pocket prescription cost list [facility] the majority of her [facility].  Will work with clinic staff to locate paperwork and follow-up on application submission/status.  She also states that she is having difficulty affording  her albuterol nebulizer solution.  It is being filled through a medical supply store through Medicare Part B.  There are no cheaper alternatives.  2. Smoking cessation: patient is currently smoking 1 ppd; therefore is not at goal.  She is currently taking bupropion XL 150 mg 1 tablet daily.  It was recommended to increase bupropion XL to 150 mg 1 tablet twice daily, the recommended dosing for smoking cessation.  Patient agrees to increase bupropion dose.  Prescription sent to [facility].  Patient would like to resume Chantix. She was able to quit smoking while on Chantix in the past.  She was on Chantix for 3 months until insurance would no longer cover and she was unable to afford.  Her urges came back upon discontinuation.  She was prescribed Chantix starter pack by Eric Form, FNP but is unable to afford. Patient filled out Chantix patient assistance and will update when we hear a response.  Discussed smoking triggers which include morning ritual of getting out of bed and having a cigarette with a cup of coffee and stress.  Discussed switching  habit of smoking with another less harmful habit such as drinking a glass of water or chewing on hard candy.  The patient is willing to try eating a lollipop when she has the urge to smoke a cigarette in order to decrease the amount that she smokes.  All questions encouraged and answered.  Instructed patient to call with any questions or concerns.  Thank you for involving pharmacy to assist in providing Ms. Wey's care  Mariella Saa, PharmD, [place], CPP Clinical Specialty Pharmacist [phone]  11/13/2018 1:44 PM

## 2018-11-13 NOTE — Telephone Encounter (Signed)
Patient was seen in office for Smoking Cessation. Patient signed Chantix PAP paperwork, stated that she provided income documents, and Prescription OOP for other PAP applications that are in process. Will follow up with clinic team for documents to submit Chantix application as CMA is on vacation. If we do still have documents, can you please copy and place in pharmacy folder.  Thanks!  12:29 PM Beatriz Chancellor, [name]

## 2018-11-13 NOTE — Telephone Encounter (Signed)
Being seen today by pharmacy 11/13/18

## 2018-11-13 NOTE — Telephone Encounter (Signed)
Patient's documents have been received from [place]. Pharmacy team will work on patient's patient assistance applications. We will return her originals.  Thanks! Beatriz Chancellor, [name]

## 2018-11-13 NOTE — Telephone Encounter (Signed)
No I do not have those papers, I have explained this to the patient and advised her that she would have to provide what is needed for the pharmacy team. The original papers were sent to scanning or missed placed. They are not scanned into patients chart as of yet. I apologized to the patient already and explained to her that it would be best for the pharmacy team to handle the application process for her medications.

## 2018-11-13 NOTE — Patient Instructions (Addendum)
   Continue Symbicort, Combivent, albuterol nebulizer, and Daliresp  Start Wellbutrin XL 150 mg twice daily, [facility]  We will apply for Chantix patient assistance  We will follow up on other patient assistance application  Try to reach for lollipop instead of cigarettes to help decrease amount of smoking

## 2018-11-16 NOTE — Telephone Encounter (Signed)
Thanks so much. 

## 2018-11-17 ENCOUNTER — Other Ambulatory Visit: Payer: Self-pay | Admitting: *Deleted

## 2018-11-17 DIAGNOSIS — Z87891 Personal history of nicotine dependence: Secondary | ICD-10-CM

## 2018-11-17 DIAGNOSIS — F17210 Nicotine dependence, cigarettes, uncomplicated: Secondary | ICD-10-CM

## 2018-11-17 DIAGNOSIS — I10 Essential (primary) hypertension: Secondary | ICD-10-CM | POA: Insufficient documentation

## 2018-11-17 DIAGNOSIS — Z122 Encounter for screening for malignant neoplasm of respiratory organs: Secondary | ICD-10-CM

## 2018-11-18 ENCOUNTER — Other Ambulatory Visit: Payer: Self-pay | Admitting: *Deleted

## 2018-11-18 ENCOUNTER — Telehealth: Payer: Self-pay

## 2018-11-18 MED ORDER — CHANTIX STARTING MONTH PAK 0.5 MG X 11 & 1 MG X 42 PO TABS: ORAL_TABLET | ORAL | 0 refills | Status: DC

## 2018-11-18 NOTE — Telephone Encounter (Signed)
Called to check status of patient's PAP applications:   [employer] Cares- Combivent Respimat- Application was APPROVED 11/18/18 through 02/25/19. Patient's prescription is with their pharmacy and should ship out in 5-7 business days.  Phone# [phone]  [place]- Symbicort and Daliresp- Application was APPROVED 11/15/18- 11/15/19. Both medications have shipped and patient will receive 11/18/18.  Phone# [phone]  [employer]- Chantix- Application was received, but rep requested rx be resent with package quantity. Amber Rph sent in rx with updated info. Will follow up.  Phone# [phone]  Called patient to advise of above, left message.   3:46 PM Beatriz Chancellor, [name]

## 2018-11-18 NOTE — Addendum Note (Signed)
Addended by: Mariella Saa C on: 11/18/2018 03:41 PM   Modules accepted: Orders

## 2018-11-18 NOTE — Telephone Encounter (Signed)
Did we fill anything out for patient for a mobile wheelchair?

## 2018-11-18 NOTE — Telephone Encounter (Signed)
Copied from [place] [id number]. Topic: General - Other >> Nov 18, 2018 11:18 AM Antonieta Iba C wrote: Reason for CRM: pt called in to follow up on paperwork for her mobile wheel chair. Pt says that she was told that it wasn't completed.   Please assist.   CB: [phone]

## 2018-11-18 NOTE — Patient Outreach (Signed)
[facility]) Care Management  11/18/2018  MUSLIMA STROPES Sep 01, 1960 [id number]   Referral Date:11/06/2018 Referral Source:MD office (pulmonology) Referral Reason:Help with medication cost Insurance:[employer]  Outreach #3, successful.  Social: Member report living wit her brother and sister, but managing her health independently.  Denies any support from family/friends, state she is independent in ADL's/IADL's.    Conditions:  Per chart, has history of hypertension, CAD, COPD on home O2, GERD, osteoarthritis, dyslipidemia, and tobacco abuse.  Medications:  Report taking as instructed however state they are costly.  State she used to have Extra help but is no longer receiving it.  She is not happy with [facility], state she feel she is being over charged.  Example, report she report she is to receive a 3 month supply for the price of one month, but realized she is being charged for the full 3 month's worth.  Her account was charged almost $300 in the last month.  She is open to discussing concerns with [facility] pharmacist.  Appointments:  Report being able to drive herself to MD visits.  Follow up with pulmonary on 10/5 and with cardiology on 10/14.  Report she did have flu shot during office visit earlier this month.  Plan:   RN CM will place referral to [facility] pharmacist and Health coach for disease management of COPD.   Valente David, [place], MSN [place] [phone]

## 2018-11-19 ENCOUNTER — Other Ambulatory Visit (HOSPITAL_COMMUNITY): Payer: Medicare Other

## 2018-11-19 ENCOUNTER — Other Ambulatory Visit: Payer: Self-pay | Admitting: Pharmacist

## 2018-11-19 NOTE — Telephone Encounter (Signed)
I'm fairly certain this was signed and sent back. Is she aware of what is needed?

## 2018-11-19 NOTE — Patient Outreach (Signed)
[facility] [facility]) Care Management  [place]   11/19/2018  Joan [name] September 03, 1960 [id number]  Reason for referral: Medication Assistance  Referral source: [facility] RN Current insurance: [facility]  PMHx includes but not limited to:  COPD, tobacco abuse,   Outreach:  Successful telephone call with patient.  HIPAA identifiers verified.   Subjective:  Patient reports she received a letter from [facility] in the beginning of 2019 that she was no longer eligible for Extra Help.  She reports she had [employer] and has since changed to [facility] due to better medication coverage.  She reports she has worked with her pulmonary office to apply for patient assistance programs to Warehouse manager ([employer], [employer]) and [employer] (Combivent).  She also reapplied for Extra Help and was denied.  She reports she was approved for [employer] but has not yet heard about [employer].  She reports she uses the [facility] OTC catalogue and is aware of the quarterly allowance.   Objective: The 10-year ASCVD risk score [name], et al., 2013) is: 11.1%   Values used to calculate the score:     Age: 58 years     Sex: Female     Is Non-Hispanic African American: No     Diabetic: Yes     Tobacco smoker: Yes     Systolic Blood Pressure: [id number] mmHg     Is BP treated: Yes     HDL Cholesterol: 43 mg/dL     Total Cholesterol: 134 mg/dL  Lab Results  Component Value Date   CREATININE 1.14 (H) 05/24/2018   CREATININE 1.20 (H) 05/22/2018   CREATININE 1.28 (H) 05/21/2018    Lab Results  Component Value Date   HGBA1C 7.0 (H) 05/22/2018    Lipid Panel     Component Value Date/Time   CHOL 134 08/19/2017 0822   TRIG 147 08/19/2017 0822   HDL 43 08/19/2017 0822   CHOLHDL 3.1 08/19/2017 0822   LDLCALC 62 08/19/2017 0822    BP Readings from Last 3 Encounters:  11/09/18 124/68  08/24/18 104/60  06/24/18 110/60    Allergies  Allergen Reactions  .  Keflex [Cephalexin] Other (See Comments)    unspecified  . Prednisone Other (See Comments)    "counter reacts"  . Shellfish Allergy Other (See Comments)    Stomach cramps  . Tetracyclines & Related Other (See Comments)    unspecified  . Dilaudid [Hydromorphone Hcl] Other (See Comments)    "Lethargy"  . Incruse Ellipta [Umeclidinium Bromide] Nausea Only  . Tuberculin Tests Other (See Comments)    Medications Reviewed Today    Reviewed by [name], [name] (Pharmacy Technician) on 11/18/18 at [place] List Status: Complete  Medication Order Taking? Sig Documenting Provider Last Dose Status Informant  acyclovir (ZOVIRAX) 400 MG tablet [id number] Yes Take 1 tablet (400 mg total) by mouth 2 (two) times daily.  Patient taking differently: Take 400 mg by mouth 2 (two) times daily as needed (cold sores).    [name], MD  Active Self  albuterol (PROVENTIL) (2.5 MG/3ML) 0.083% nebulizer solution [id number] Yes Take 3 mLs (2.5 mg total) by nebulization 2 (two) times daily. And every 6 hours as needed.  DX: J44.9 [name], NP  Active Self  atenolol (TENORMIN) 25 MG tablet [id number] Yes Take 1 tablet (25 mg total) by mouth daily. [name], MD  Active Self  atorvastatin (LIPITOR) 20 MG tablet [id number] Yes Take 1  tablet (20 mg total) by mouth daily. [name], MD  Active Self       Patient not taking:      Discontinued 11/18/18 1303 (Change in therapy)   baclofen (LIORESAL) 10 MG tablet [id number] Yes Take 10 mg by mouth at bedtime. [provider]  Active Self  budesonide-formoterol (SYMBICORT) 160-4.5 MCG/ACT inhaler [id number] Yes Inhale 2 puffs into the lungs 2 (two) times daily. [name], NP  Active Self  buPROPion (WELLBUTRIN XL) 150 MG 24 hr tablet [id number] Yes Take 1 tablet (150 mg total) by mouth 2 (two) times daily.  Patient taking differently: Take 300 mg by mouth daily.    [name], NP  Active Self  cyclobenzaprine  (FLEXERIL) 10 MG tablet [id number] Yes Take 1 tablet (10 mg total) by mouth at bedtime. [name], MD  Active Self  diphenhydrAMINE (BENADRYL) 25 MG tablet [id number] Yes Take 25 mg by mouth 2 (two) times daily.  [provider]  Active Self  docusate sodium (COLACE) 100 MG capsule [id number] Yes Take 100 mg by mouth daily. [provider]  Active Self  gabapentin (NEURONTIN) 300 MG capsule [id number] Yes TAKE 1 CAPSULE BY MOUTH  TWICE DAILY  Patient taking differently: Take 600 mg by mouth at bedtime.    [name], MD  Active Self  glucose blood (ONE TOUCH ULTRA TEST) test strip [id number]  Use to check blood sugars twice a day [name], MD  Active Self  hydrochlorothiazide (HYDRODIURIL) 25 MG tablet [id number] Yes Take 1 tablet (25 mg total) by mouth daily. [name], MD  Active Self  Ipratropium-Albuterol (COMBIVENT RESPIMAT) 20-100 MCG/ACT AERS respimat [id number] Yes Inhale 1 puff into the lungs every 6 (six) hours as needed for wheezing.  Patient taking differently: Inhale 1 puff into the lungs every 6 (six) hours.    [name], MD  Active Self  levothyroxine (SYNTHROID, LEVOTHROID) 25 MCG tablet [id number] Yes Take 1 tablet (25 mcg total) by mouth daily before breakfast. [name], MD  Active Self  Liniments [facility] ARTHRITIS PAIN RELIEF EX) [id number] Yes Apply 1 patch topically daily as needed (pain). [provider]  Active Self  Melatonin 10 MG TABS [id number] Yes Take 10 mg by mouth at bedtime. [provider]  Active Self  Misc Natural Products (7-KETO LEAN) [id number] Yes Take 1 capsule by mouth 2 (two) times daily. Keto Diet Supplements [provider]  Active Self  montelukast (SINGULAIR) 10 MG tablet [id number] Yes Take 1 tablet (10 mg total) by mouth at bedtime. [name], MD  Active Self  Multiple Vitamins-Minerals (HAIR/SKIN/NAILS/BIOTIN) TABS [id number] Yes Take 1 tablet  by mouth daily. [provider]  Active Self  Multiple Vitamins-Minerals (MULTIVITAMIN WITH MINERALS) tablet [id number] Yes Take 1 tablet by mouth daily. [provider]  Active Self  naproxen (NAPROSYN) 500 MG tablet [id number] Yes Take 1 tablet (500 mg total) by mouth 2 (two) times daily with a meal.  Patient taking differently: Take 500 mg by mouth 2 (two) times daily as needed (pain).    [name], MD  Active Self  nystatin-triamcinolone ointment [facility]) [id number] Yes Apply 1 application topically 2 (two) times daily as needed (applied to skin folds/groins/under breast skin irritation rash.). [name], MD  Active Self  OXYGEN [id number] Yes Inhale 2 L into the lungs at bedtime. May use 2.5 - 3 L during the day with strenuous activity [provider]  Active Self  pantoprazole (PROTONIX) 40 MG tablet [id number] Yes Take 1 tablet (40 mg total) by mouth daily. [name], MD  Active Self        Discontinued 11/18/18 1258 (Completed Course)   roflumilast (DALIRESP) 500 MCG TABS tablet [id number] Yes Take 1 tablet (500 mcg total) by mouth daily. [name], NP  Active Self  simethicone (MYLICON) 0000000 MG chewable tablet [id number] Yes Chew 125 mg by mouth every 6 (six) hours as needed for flatulence. [provider]  Active Self  traZODone (DESYREL) 100 MG tablet [id number] Yes Take 1 tablet (100 mg total) by mouth at bedtime. [name], MD  Active Self  varenicline (CHANTIX STARTING MONTH PAK) 0.5 MG X 11 & 1 MG X 42 tablet [id number]  Take one 0.5 mg tablet by mouth once daily for 3 days, then increase to one 0.5 mg tablet twice daily for 4 days, then increase to one 1 mg tablet twice daily. [name], NP  Active Self           Med Note Rosemarie Beath, [name] B   Wed Nov 18, 2018 12:58 PM) Have not started   venlafaxine XR (EFFEXOR-XR) 150 MG 24 hr capsule [id number] Yes TAKE 1 CAPSULE DAILY WITH  BREAKFAST  Patient taking  differently: Take 150 mg by mouth daily with breakfast.    [name], MD  Active Self          Medication Review Findings:  . Combivent patient assistance program application already in process through the [place] pulmonary clinic.  Note patient had o/v with PharmD on 9/18 to discuss assistance programs.  Reviewed eligibility for [employer] [place] and how to apply again in 2021.  Patient voiced understanding.  She will f/u with office in 1-2 weeks re: decision on Combivent application.    Plan: . Will close [facility] pharmacy case  . Will make [place] referral for COPD / smoking cessation management   Ralene Bathe, PharmD, [place] [phone]

## 2018-11-20 ENCOUNTER — Other Ambulatory Visit: Payer: Self-pay

## 2018-11-20 ENCOUNTER — Encounter (HOSPITAL_COMMUNITY): Payer: Self-pay | Admitting: Emergency Medicine

## 2018-11-20 ENCOUNTER — Telehealth: Payer: Self-pay | Admitting: Gastroenterology

## 2018-11-20 ENCOUNTER — Other Ambulatory Visit: Payer: Self-pay | Admitting: *Deleted

## 2018-11-20 ENCOUNTER — Other Ambulatory Visit (HOSPITAL_COMMUNITY)
Admission: RE | Admit: 2018-11-20 | Discharge: 2018-11-20 | Disposition: A | Discharge status: Home / Self Care | Payer: Medicare Other | Source: Ambulatory Visit | Attending: Gastroenterology | Admitting: Gastroenterology

## 2018-11-20 ENCOUNTER — Other Ambulatory Visit (HOSPITAL_COMMUNITY): Payer: Medicare Other

## 2018-11-20 DIAGNOSIS — Z01812 Encounter for preprocedural laboratory examination: Secondary | ICD-10-CM | POA: Diagnosis not present

## 2018-11-20 DIAGNOSIS — Z20828 Contact with and (suspected) exposure to other viral communicable diseases: Secondary | ICD-10-CM | POA: Insufficient documentation

## 2018-11-20 MED ORDER — NA SULFATE-K SULFATE-MG SULF 17.5-3.13-1.6 GM/177ML PO SOLN: 1.0000 | ORAL | 0 refills | Status: DC

## 2018-11-20 NOTE — Progress Notes (Signed)
Pre-op endo call completed. Patient reports improvement with breathing since completing treatment prescribed at [facility] with pulmonology. Uses cpap with 2L o2 at HS. 2.5L during the day with tiring activities.  Ongoing cough otherwise "no" to all other covid screening questions  Plan to get covid test this afternoon at 2:30

## 2018-11-20 NOTE — Telephone Encounter (Signed)
Pt wants to know if she will be admitted to [place] 1 or 2 days before her procedure on 9/29. She states that she usually is because of her issues with anesthesia.

## 2018-11-20 NOTE — Telephone Encounter (Signed)
Pt is scheduled for hosp EGD/col and stated that she has not received any prep instructions.  She would like a call back to discuss prep soln.

## 2018-11-20 NOTE — Telephone Encounter (Signed)
Spoke with patient she was concerned about prep being too much volume. Went over prep instructions with her and sent her instructions via mychart in a patient message. She verbalized understanding and knows she needs to get covid tested today for her procedure on Tuesday.

## 2018-11-20 NOTE — Progress Notes (Signed)
Pre-op endo call attempted. No answer. lmtcb 

## 2018-11-20 NOTE — Telephone Encounter (Signed)
Spoke with the patient and told the patient that this was an outpatient procedure which meant that no admission 1 or 2 days prior would be required. Patient verbalized understanding.

## 2018-11-22 LAB — NOVEL CORONAVIRUS, NAA (HOSP ORDER, SEND-OUT TO REF LAB; TAT 18-24 HRS): SARS-CoV-2, NAA: NOT DETECTED

## 2018-11-23 ENCOUNTER — Telehealth: Payer: Self-pay | Admitting: Gastroenterology

## 2018-11-23 ENCOUNTER — Encounter (HOSPITAL_COMMUNITY): Payer: Self-pay | Admitting: Anesthesiology

## 2018-11-23 NOTE — Anesthesia Preprocedure Evaluation (Addendum)
Anesthesia Evaluation  Patient identified by MRN, date of birth, ID band Patient awake    Reviewed: Allergy & Precautions, H&P , NPO status , Patient's Chart, lab work & pertinent test results  Airway Mallampati: I       Dental  (+) Missing,    Pulmonary asthma , sleep apnea and Oxygen sleep apnea , pneumonia, COPD, Current Smoker and Patient abstained from smoking., former smoker,    breath sounds clear to auscultation       Cardiovascular hypertension, On Medications and On Home Beta Blockers Normal cardiovascular exam Rhythm:Regular Rate:Normal     Neuro/Psych  Headaches, PSYCHIATRIC DISORDERS Anxiety Depression Bipolar Disorder    GI/Hepatic GERD  Medicated,(+) neg Cirrhosis      , Hepatitis -, C  Endo/Other  diabetesHypothyroidism   Renal/GU      Musculoskeletal   Abdominal Normal abdominal exam  (+)   Peds  Hematology   Anesthesia Other Findings   Reproductive/Obstetrics                           Anesthesia Physical  Anesthesia Plan  ASA: III  Anesthesia Plan: MAC   Post-op Pain Management:    Induction:   PONV Risk Score and Plan: Treatment may vary due to age or medical condition, Ondansetron and Midazolam  Airway Management Planned: Nasal Cannula, Natural Airway, Simple Face Mask and Mask  Additional Equipment:   Intra-op Plan:   Post-operative Plan:   Informed Consent: I have reviewed the patients History and Physical, chart, labs and discussed the procedure including the risks, benefits and alternatives for the proposed anesthesia with the patient or authorized representative who has indicated his/her understanding and acceptance.       Plan Discussed with: CRNA  Anesthesia Plan Comments:        Anesthesia Quick Evaluation

## 2018-11-23 NOTE — Telephone Encounter (Signed)
Spoke to the patient at length and reviewed her prep instructions. All of the patient's questions were answered. The patient verbalized understanding. No other questions or concerns voiced when asked at the conclusion of the call.

## 2018-11-24 ENCOUNTER — Encounter (HOSPITAL_COMMUNITY): Payer: Self-pay | Admitting: *Deleted

## 2018-11-24 ENCOUNTER — Ambulatory Visit (HOSPITAL_COMMUNITY): Payer: Medicare Other | Admitting: Anesthesiology

## 2018-11-24 ENCOUNTER — Ambulatory Visit (HOSPITAL_COMMUNITY)
Admission: RE | Admit: 2018-11-24 | Discharge: 2018-11-24 | Disposition: A | Discharge status: Home / Self Care | Payer: Medicare Other | Attending: Gastroenterology | Admitting: Gastroenterology

## 2018-11-24 ENCOUNTER — Encounter (HOSPITAL_COMMUNITY)
Admission: RE | Disposition: A | Discharge status: Home / Self Care | Payer: Self-pay | Source: Home / Self Care | Attending: Gastroenterology

## 2018-11-24 DIAGNOSIS — Z79890 Hormone replacement therapy (postmenopausal): Secondary | ICD-10-CM | POA: Diagnosis not present

## 2018-11-24 DIAGNOSIS — K2950 Unspecified chronic gastritis without bleeding: Secondary | ICD-10-CM | POA: Diagnosis not present

## 2018-11-24 DIAGNOSIS — Y929 Unspecified place or not applicable: Secondary | ICD-10-CM | POA: Diagnosis not present

## 2018-11-24 DIAGNOSIS — G4733 Obstructive sleep apnea (adult) (pediatric): Secondary | ICD-10-CM | POA: Insufficient documentation

## 2018-11-24 DIAGNOSIS — Z96643 Presence of artificial hip joint, bilateral: Secondary | ICD-10-CM | POA: Diagnosis not present

## 2018-11-24 DIAGNOSIS — R143 Flatulence: Secondary | ICD-10-CM | POA: Diagnosis not present

## 2018-11-24 DIAGNOSIS — R140 Abdominal distension (gaseous): Secondary | ICD-10-CM

## 2018-11-24 DIAGNOSIS — K3189 Other diseases of stomach and duodenum: Secondary | ICD-10-CM | POA: Diagnosis not present

## 2018-11-24 DIAGNOSIS — M1990 Unspecified osteoarthritis, unspecified site: Secondary | ICD-10-CM | POA: Insufficient documentation

## 2018-11-24 DIAGNOSIS — E7800 Pure hypercholesterolemia, unspecified: Secondary | ICD-10-CM | POA: Insufficient documentation

## 2018-11-24 DIAGNOSIS — R194 Change in bowel habit: Secondary | ICD-10-CM | POA: Insufficient documentation

## 2018-11-24 DIAGNOSIS — T380X5A Adverse effect of glucocorticoids and synthetic analogues, initial encounter: Secondary | ICD-10-CM | POA: Insufficient documentation

## 2018-11-24 DIAGNOSIS — E039 Hypothyroidism, unspecified: Secondary | ICD-10-CM | POA: Insufficient documentation

## 2018-11-24 DIAGNOSIS — K219 Gastro-esophageal reflux disease without esophagitis: Secondary | ICD-10-CM | POA: Diagnosis not present

## 2018-11-24 DIAGNOSIS — J449 Chronic obstructive pulmonary disease, unspecified: Secondary | ICD-10-CM | POA: Diagnosis not present

## 2018-11-24 DIAGNOSIS — Z1211 Encounter for screening for malignant neoplasm of colon: Secondary | ICD-10-CM

## 2018-11-24 DIAGNOSIS — N182 Chronic kidney disease, stage 2 (mild): Secondary | ICD-10-CM | POA: Diagnosis not present

## 2018-11-24 DIAGNOSIS — E0922 Drug or chemical induced diabetes mellitus with diabetic chronic kidney disease: Secondary | ICD-10-CM | POA: Diagnosis not present

## 2018-11-24 DIAGNOSIS — F419 Anxiety disorder, unspecified: Secondary | ICD-10-CM | POA: Diagnosis not present

## 2018-11-24 DIAGNOSIS — R142 Eructation: Secondary | ICD-10-CM | POA: Diagnosis not present

## 2018-11-24 DIAGNOSIS — I129 Hypertensive chronic kidney disease with stage 1 through stage 4 chronic kidney disease, or unspecified chronic kidney disease: Secondary | ICD-10-CM | POA: Diagnosis not present

## 2018-11-24 DIAGNOSIS — K30 Functional dyspepsia: Secondary | ICD-10-CM | POA: Diagnosis not present

## 2018-11-24 DIAGNOSIS — R198 Other specified symptoms and signs involving the digestive system and abdomen: Secondary | ICD-10-CM

## 2018-11-24 DIAGNOSIS — F17210 Nicotine dependence, cigarettes, uncomplicated: Secondary | ICD-10-CM | POA: Diagnosis not present

## 2018-11-24 DIAGNOSIS — Z79899 Other long term (current) drug therapy: Secondary | ICD-10-CM | POA: Diagnosis not present

## 2018-11-24 DIAGNOSIS — I10 Essential (primary) hypertension: Secondary | ICD-10-CM | POA: Diagnosis not present

## 2018-11-24 HISTORY — PX: COLONOSCOPY WITH PROPOFOL: SHX5780

## 2018-11-24 HISTORY — PX: BIOPSY: SHX5522

## 2018-11-24 HISTORY — PX: ESOPHAGOGASTRODUODENOSCOPY (EGD) WITH PROPOFOL: SHX5813

## 2018-11-24 LAB — GLUCOSE, CAPILLARY: Glucose-Capillary: 129 mg/dL — ABNORMAL HIGH (ref 70–99)

## 2018-11-24 SURGERY — COLONOSCOPY WITH PROPOFOL
Anesthesia: Monitor Anesthesia Care

## 2018-11-24 MED ORDER — SODIUM CHLORIDE 0.9 % IV SOLN: INTRAVENOUS | Status: DC

## 2018-11-24 MED ORDER — PROPOFOL 10 MG/ML IV BOLUS
INTRAVENOUS | Status: AC
  Filled 2018-11-24: qty 40

## 2018-11-24 MED ORDER — LIDOCAINE 2% (20 MG/ML) 5 ML SYRINGE
INTRAMUSCULAR | Status: DC | PRN
  Administered 2018-11-24: 80 mg via INTRAVENOUS

## 2018-11-24 MED ORDER — PROPOFOL 10 MG/ML IV BOLUS
INTRAVENOUS | Status: DC | PRN
  Administered 2018-11-24: 30 mg via INTRAVENOUS

## 2018-11-24 MED ORDER — LACTATED RINGERS IV SOLN
INTRAVENOUS | Status: DC
  Administered 2018-11-24: 08:00:00 via INTRAVENOUS

## 2018-11-24 MED ORDER — PROPOFOL 500 MG/50ML IV EMUL
INTRAVENOUS | Status: DC | PRN
  Administered 2018-11-24: 150 ug/kg/min via INTRAVENOUS

## 2018-11-24 MED ORDER — PROPOFOL 10 MG/ML IV BOLUS
INTRAVENOUS | Status: AC
  Filled 2018-11-24: qty 60

## 2018-11-24 SURGICAL SUPPLY — 24 items

## 2018-11-24 NOTE — Op Note (Signed)
[facility] Patient Name: Joan [name] Procedure Date: 11/24/2018 MRN: [id number] Attending MD: Thornton Park MD, MD Date of Birth: 10-18-1960 CSN: [id number] Age: 58 Admit Type: Outpatient Procedure:                Colonoscopy Indications:              Screening for colorectal malignant neoplasm                           Prior colonoscopy >10 years ago in [place],                            [place] Providers:                Thornton Park MD, MD, Grace Isaac, RN, Josie Dixon, RN, William Dalton, Technician Referring MD:              Medicines:                See the Anesthesia note for documentation of the                            administered medications Complications:            No immediate complications. Estimated Blood Loss:     Estimated blood loss: none. Procedure:                Pre-Anesthesia Assessment:                           - Prior to the procedure, a History and Physical                            was performed, and patient medications and                            allergies were reviewed. The patient's tolerance of                            previous anesthesia was also reviewed. The risks                            and benefits of the procedure and the sedation                            options and risks were discussed with the patient.                            All questions were answered, and informed consent                            was obtained. Prior Anticoagulants: The patient has                            taken no previous  anticoagulant or antiplatelet                            agents. ASA Grade Assessment: III - A patient with                            severe systemic disease. After reviewing the risks                            and benefits, the patient was deemed in                            satisfactory condition to undergo the procedure. .                           After obtaining  informed consent, the colonoscope                            was passed under direct vision. Throughout the                            procedure, the patient's blood pressure, pulse, and                            oxygen saturations were monitored continuously. The                            [id number] Olympus colonoscope was                            introduced through the anus and advanced to the the                            terminal ileum, with identification of the                            appendiceal orifice and IC valve. A second forward                            view of the right colon was performed. The                            colonoscopy was performed without difficulty. The                            patient tolerated the procedure well. The quality                            of the bowel preparation was good. The ileocecal                            valve, appendiceal orifice, and rectum were  photographed. The terminal ileum was visualized but                            not photographed. Scope In: 9:03:34 AM Scope Out: 9:17:27 AM Scope Withdrawal Time: 0 hours 8 minutes 45 seconds  Total Procedure Duration: 0 hours 13 minutes 53 seconds  Findings:      The perianal and digital rectal examinations were normal.      The entire examined colon appeared normal on direct and retroflexion       views. However, there was some stool/food residual in the cecum. Impression:               - The entire examined colon is normal on direct and                            retroflexion views.                           - No specimens collected. Moderate Sedation:      Not Applicable - Patient had care per Anesthesia. Recommendation:           - Patient has a contact number available for                            emergencies. The signs and symptoms of potential                            delayed complications were discussed with the                             patient. Return to normal activities tomorrow.                            Written discharge instructions were provided to the                            patient.                           - Resume previous diet today.                           - Continue present medications.                           - Repeat colonoscopy in 10 years for screening                            purposes. Recommended extended diet changes given                            the retained stool/food residual in the cecum. Procedure Code(s):        --- Professional ---                           [id number], Colorectal cancer screening; colonoscopy on  individual not meeting criteria for high risk Diagnosis Code(s):        --- Professional ---                           Z12.11, Encounter for screening for malignant                            neoplasm of colon CPT copyright 2019 [employer]. All rights reserved. The codes documented in this report are preliminary and upon coder review may  be revised to meet current compliance requirements. Thornton Park MD, MD 11/24/2018 9:32:59 AM This report has been signed electronically. Number of Addenda: 0

## 2018-11-24 NOTE — Transfer of Care (Signed)
Immediate Anesthesia Transfer of Care Note  Patient: Joan [name]  Procedure(s) Performed: COLONOSCOPY WITH PROPOFOL (N/A ) ESOPHAGOGASTRODUODENOSCOPY (EGD) WITH PROPOFOL (N/A ) BIOPSY  Patient Location: Endoscopy Unit  Anesthesia Type:MAC  Level of Consciousness: awake, alert , oriented and patient cooperative  Airway & Oxygen Therapy: Patient Spontanous Breathing and Patient connected to face mask oxygen  Post-op Assessment: Report given to RN, Post -op Vital signs reviewed and stable and Patient moving all extremities  Post vital signs: Reviewed and stable  Last Vitals:  Vitals Value Taken Time  BP    Temp    Pulse 66 11/24/18 0924  Resp 24 11/24/18 0924  SpO2 100 % 11/24/18 0924  Vitals shown include unvalidated device data.  Last Pain:  Vitals:   11/24/18 0730  TempSrc: Oral  PainSc: 0-No pain         Complications: No apparent anesthesia complications

## 2018-11-24 NOTE — Op Note (Signed)
[facility] Patient Name: Joan [name] Procedure Date: 11/24/2018 MRN: [id number] Attending MD: Thornton Park MD, MD Date of Birth: [birth year] CSN: [id number] Age: 58 Admit Type: Outpatient Procedure:                Upper GI endoscopy Indications:              Eructation                           Frequent belching and flatulence                           - not improved with GasEx                           Change in bowel habits x 1 year                           Dysphonia evaluated by ENT and thought to be                            related to smoking Providers:                Thornton Park MD, MD, Grace Isaac, RN, Josie Dixon, RN, William Dalton, Technician Referring MD:              Medicines:                See the Anesthesia note for documentation of the                            administered medications Complications:            No immediate complications. Estimated blood loss:                            Minimal. Estimated Blood Loss:     Estimated blood loss was minimal. Procedure:                Pre-Anesthesia Assessment:                           - Prior to the procedure, a History and Physical                            was performed, and patient medications and                            allergies were reviewed. The patient's tolerance of                            previous anesthesia was also reviewed. The risks                            and benefits of the procedure and the sedation  options and risks were discussed with the patient.                            All questions were answered, and informed consent                            was obtained. Prior Anticoagulants: The patient has                            taken no previous anticoagulant or antiplatelet                            agents. ASA Grade Assessment: III - A patient with                            severe systemic disease.  After reviewing the risks                            and benefits, the patient was deemed in                            satisfactory condition to undergo the procedure.                           After obtaining informed consent, the endoscope was                            passed under direct vision. Throughout the                            procedure, the patient's blood pressure, pulse, and                            oxygen saturations were monitored continuously. The                            [id number] Olympus gastroscope was                            introduced through the mouth, and advanced to the                            second part of duodenum. The upper GI endoscopy was                            accomplished without difficulty. The patient                            tolerated the procedure well. Scope In: Scope Out: Findings:      The examined esophagus was normal.      Diffuse mildly erythematous mucosa without bleeding was found in the       gastric body. Biopsies were taken from the antrum, body, and fundus with  a cold forceps for histology. Estimated blood loss was minimal.      The examined duodenum was normal. Biopsies were taken with a cold       forceps for histology. Estimated blood loss was minimal.      The cardia and gastric fundus were normal on retroflexion.      The exam was otherwise without abnormality. Impression:               - Normal esophagus.                           - Erythematous mucosa in the gastric body. Biopsied.                           - Normal examined duodenum. Biopsied.                           - The examination was otherwise normal. Moderate Sedation:      Not Applicable - Patient had care per Anesthesia. Recommendation:           - Patient has a contact number available for                            emergencies. The signs and symptoms of potential                            delayed complications were discussed with  the                            patient. Return to normal activities tomorrow.                            Written discharge instructions were provided to the                            patient.                           - Resume previous diet today.                           - Continue present medications.                           - Await pathology results.                           - Follow-up appointment to review these results                            (may be virtual).                           - Proceed with colonoscopy today as previously                            planned. Procedure Code(s):        ---  Professional ---                           [phone], Esophagogastroduodenoscopy, flexible,                            transoral; with biopsy, single or multiple Diagnosis Code(s):        --- Professional ---                           K31.89, Other diseases of stomach and duodenum                           R14.2, Eructation CPT copyright 2019 [employer] Association. All rights reserved. The codes documented in this report are preliminary and upon coder review may  be revised to meet current compliance requirements. Thornton Park MD, MD 11/24/2018 9:27:21 AM This report has been signed electronically. Number of Addenda: 0

## 2018-11-24 NOTE — Discharge Instructions (Signed)
YOU HAD AN ENDOSCOPIC PROCEDURE TODAY: Refer to the procedure report and other information in the discharge instructions given to you for any specific questions about what was found during the examination. If this information does not answer your questions, please call [place] office at [phone] to clarify.  ° °YOU SHOULD EXPECT: Some feelings of bloating in the abdomen. Passage of more gas than usual. Walking can help get rid of the air that was put into your GI tract during the procedure and reduce the bloating. If you had a lower endoscopy (such as a colonoscopy or flexible sigmoidoscopy) you may notice spotting of blood in your stool or on the toilet paper. Some abdominal soreness may be present for a day or two, also. ° °DIET: Your first meal following the procedure should be a light meal and then it is ok to progress to your normal diet. A half-sandwich or bowl of soup is an example of a good first meal. Heavy or fried foods are harder to digest and may make you feel nauseous or bloated. Drink plenty of fluids but you should avoid alcoholic beverages for 24 hours. If you had a esophageal dilation, please see attached instructions for diet.   ° °ACTIVITY: Your care partner should take you home directly after the procedure. You should plan to take it easy, moving slowly for the rest of the day. You can resume normal activity the day after the procedure however YOU SHOULD NOT DRIVE, use power tools, machinery or perform tasks that involve climbing or major physical exertion for 24 hours (because of the sedation medicines used during the test).  ° °SYMPTOMS TO REPORT IMMEDIATELY: °A gastroenterologist can be reached at any hour. Please call [phone]  for any of the following symptoms:  °Following lower endoscopy (colonoscopy, flexible sigmoidoscopy) °Excessive amounts of blood in the stool  °Significant tenderness, worsening of abdominal pains  °Swelling of the abdomen that is new, acute  °Fever of 100° or  higher  °Following upper endoscopy (EGD, EUS, ERCP, esophageal dilation) °Vomiting of blood or coffee ground material  °New, significant abdominal pain  °New, significant chest pain or pain under the shoulder blades  °Painful or persistently difficult swallowing  °New shortness of breath  °Black, tarry-looking or red, bloody stools ° °FOLLOW UP:  °If any biopsies were taken you will be contacted by phone or by letter within the next 1-3 weeks. Call [phone]  if you have not heard about the biopsies in 3 weeks.  °Please also call with any specific questions about appointments or follow up tests. ° °

## 2018-11-24 NOTE — H&P (Signed)
Referring Provider: No ref. provider found Primary Care Physician:  [name], MD   Chief complaint:  Need for colon cancer screening  IMPRESSION:  Need for colon cancer screening Frequent belching and flatulence    - not improved with GasEx Change in bowel habits x 1 year Dysphonia evaluated by ENT and thought to be related to smoking Severe COPD on 2L oxygen in the daytime and 3L when active OSA on CPAP Tobacco habituation Wheelchair use for deconditioning   Recent GI symptoms are likely a combination of diet and aerophagia from her COPD, CPAP, and continued smoking.  No alarm features.   Must consider gastric etiologies given the association with eating.  As well as reflux,  functional dyspepsia, supragastric belching, SIBO.  Reviewed belly breathing as a way to minimize swallowed air.   I've recommended the discontinuation of gum chewing, smoking, drinking carbonated beverages, and gulping foods and liquids. Any underlying anxiety or depression should be treated.   Will proceed with colon cancer screening. I have recommended an EGD concurrently given the differential.   PLAN: Continue pantoprazole EGD and colonoscopy today Obtain prior colonoscopy and EGD report from [place], [place]   I consented the patient discussing the risks, benefits, and alternatives to endoscopic evaluation. In particular, we discussed the risks that include, but are not limited to, reaction to medication, cardiopulmonary compromise, bleeding requiring blood transfusion, aspiration resulting in pneumonia, perforation requiring surgery, lack of diagnosis, severe illness requiring hospitalization, and even death. We reviewed the risk of missed lesion including polyps or even cancer. The patient acknowledges these risks and asks that we proceed.   HPI: Joan [name] is a 58 y.o. female previously followed by Dr. Loletha Carrow for frequent belching and flatulence.  She was last seen by Dr.  Loletha Carrow 01/01/2018.  She is referred now for colonoscopy for colon cancer screening.  The interval history is obtained to the patient and review of her electronic health record.  She is an every day smoker who has severe COPD, steroid-induced diabetes, obstructive sleep apnea on CPAP, bipolar disorder, hypertension, stage II chronic kidney disease, hypothyroidism, pulmonary nodule followed by the lung cancer screening program.  She has been hospitalized for COPD exacerbations in 12/2017 and 04/2018.  She uses oxygen @ 2L as needed during day and if she is active she uses 3 L. She has been using a wheelchair due to physical deconditioning.  She has frequent, embarrassing belching and flatulence.  Associated bloating and distension that occurs while eating or immediately following.  Intermittent malodorous. Her bowel habits were previously regular, but for the last couple of years she will have some feelings of lower abdominal pressure, need for a bowel movement, then just small pellets will pass.  She denies rectal bleeding, melena, or hematochezia.  She has recently lost 20 pounds with diet changes. No identified specific food triggers. GasEx provided no significant relief.  No other associated symptoms. No identified exacerbating or relieving features.   Change in bowel habits over the last year. Stools fluctuate from formed (rabbit pellets) to liquid.   She uses Sweet-n-Low. Drinks a 12 pack of creamed soda every couple of weeks. Drinks coffee daily.   Had an upper endoscopy and colonoscopy in [place], [place] many years ago for similar symptoms. She feels like she was in her 46s when those were performed.  Dr. Loletha Carrow felt her symptoms were combination of dietary, maldigestion, and aerophagia from her COPD and continued smoking. She feels like the symptoms  are too severe to be related to her COPD and smoking.   No known family history of colon cancer or polyps. No family history of uterine/endometrial  cancer, pancreatic cancer or gastric/stomach cancer.  Prior abdominal in imaging: CT of the abdomen and pelvis with contrast 06/03/2017 obtained to evaluate right upper quadrant abdominal pain: Mesh repair of an umbilical hernia, small fluid collection in the extraperitoneal abdominal wall superficial to the mesh, no residual hernia, stable L5-S1 posterior instrumented fusion, right hip replacement  Labs from March 2020 show a hemoglobin of 12.4, MCV 91.9, RDW 15.4.  Past Medical History:  Diagnosis Date  . Acute respiratory failure with hypoxia ([place])   . Alcoholism and drug addiction in family   . Anxiety   . Arthritis    "knees, hips, lower back" (09/25/2016)  . Asthma   . Bipolar disorder ([place])   . Chronic bronchitis ([place])    "all the time" (09/25/2016)  . Chronic leg pain    RLE  . Chronic lower back pain   . COPD (chronic obstructive pulmonary disease) ([place])   . Depression   . Emphysema of lung ([place])   . GERD (gastroesophageal reflux disease)   . Headache   . Hepatitis C    "treated back in 2001-2002"  . High cholesterol   . Hypertension   . Hypothyroidism   . Incarcerated ventral hernia 04/04/2017  . On home oxygen therapy    "2L; at nighttime" (04/04/2017)  . OSA on CPAP   . Pneumonia    "all the time" (09/25/2016)  . Positive PPD    with negative chest x ray  . Tachycardia    patient reports with exertion ; denies any other conditions   . Thyroid disease     Past Surgical History:  Procedure Laterality Date  . BACK SURGERY    . CARDIAC CATHETERIZATION    . COLONOSCOPY    . CYST EXCISION     removal of cyst in sinuses  . DILATION AND CURETTAGE OF UTERUS    . FOOT SURGERY Bilateral    bone removed from 4th and 5 th toes and put in pin then took pin out  . HERNIA REPAIR    . INCISION AND DRAINAGE  ~ 2014/2015   "removed mesh & infection"  . INCISIONAL HERNIA REPAIR N/A 04/04/2017   Procedure: LAPAROSCOPIC INCISIONAL HERNIA REPAIR WITH MESH;  Surgeon: Fanny Skates,  MD;  Location: [facility];  Service: General;  Laterality: N/A;  . LACERATION REPAIR Right    repair wrist artery from laceration from ice skate  . LAPAROSCOPIC INCISIONAL / UMBILICAL / VENTRAL HERNIA REPAIR  04/04/2017   LAPAROSCOPIC INCISIONAL HERNIA REPAIR WITH MESH  . LIPOMA EXCISION Right    fattty lipoma in neck  . NASAL SEPTUM SURGERY    . POSTERIOR LUMBAR FUSION  2015/2016 X 2   "added rods and screws"  . TOTAL HIP ARTHROPLASTY Right 02/05/2017   Procedure: RIGHT TOTAL HIP ARTHROPLASTY;  Surgeon: [name], MD;  Location: [facility];  Service: Orthopedics;  Laterality: Right;  . TOTAL HIP ARTHROPLASTY Left 06/18/2017   Procedure: LEFT TOTAL HIP ARTHROPLASTY;  Surgeon: [name], MD;  Location: [facility];  Service: Orthopedics;  Laterality: Left;  . TUBAL LIGATION    . UMBILICAL HERNIA REPAIR  ~ 2013   w/mesh  . UPPER GI ENDOSCOPY      Current Facility-Administered Medications  Medication Dose Route Frequency Provider Last Rate Last Dose  . lactated ringers infusion  Intravenous Continuous [place], MD 20 mL/hr at 11/24/18 [id number]      Allergies as of 10/15/2018 - Review Complete 09/01/2018  Allergen Reaction Noted  . Keflex [cephalexin] Other (See Comments) 07/27/2013  . Prednisone Other (See Comments) 08/09/2016  . Shellfish allergy Other (See Comments) 07/27/2013  . Tetracyclines & related Other (See Comments) 07/27/2013  . Dilaudid [hydromorphone hcl] Other (See Comments) 08/09/2016  . Incruse ellipta [umeclidinium bromide] Nausea Only 06/09/2017  . Tuberculin tests Other (See Comments) 06/23/2017    Family History  Problem Relation Age of Onset  . Diabetes Mother   . Hypertension Mother   . Hypertension Father   . Cancer Father        lung    Social History   Socioeconomic History  . Marital status: Widowed    Spouse name: Not on file  . Number of children: 3  . Years of education: Not on file  . Highest education level: Not on file  Occupational  History  . Not on file  Social Needs  . Financial resource strain: Somewhat hard  . Food insecurity    Worry: Never true    Inability: Never true  . Transportation needs    Medical: No    Non-medical: No  Tobacco Use  . Smoking status: Current Every Day Smoker    Packs/day: 1.00    Years: 44.00    Pack years: 44.00    Types: Cigarettes  . Smokeless tobacco: Never Used  . Tobacco comment: Smoker 1-1.5 PPD every day, 11-20-2018 "i got myself down to half a pack", i am gonna be going on chantix on the 28th"  Substance and Sexual Activity  . Alcohol use: Yes  . Drug use: Yes    Types: "Crack" cocaine    Comment: 04/04/2017 "nothing since 01/12/2012"  . Sexual activity: Never  Lifestyle  . Physical activity    Days per week: 0 days    Minutes per session: 0 min  . Stress: To some extent  Relationships  . Social Herbalist on phone: Three times a week    Gets together: Once a week    Attends religious service: Never    Active member of club or organization: No    Attends meetings of clubs or organizations: Never    Relationship status: Widowed  . Intimate partner violence    Fear of current or ex partner: No    Emotionally abused: No    Physically abused: No    Forced sexual activity: No  Other Topics Concern  . Not on file  Social History Narrative  . Not on file    Review of Systems: ALL ROS discussed and all others negative except listed in HPI.  Physical Exam: Complete physical exam not performed due to the limits inherent in a telehealth encounter.  General: Awake, alert, and oriented, and well communicative. In no acute distress.  HEENT: EOMI, non-icteric sclera, NCAT, MMM  Neck: Normal movement of head and neck  Pulm: No labored breathing, speaking in full sentences without conversational dyspnea  Derm: No apparent lesions or bruising in visible field  MS: Moves all visible extremities without noticeable abnormality  Psych: Pleasant, cooperative,  normal speech, normal affect and normal insight Neuro: Alert and appropriate   Brant Peets L. Tarri Glenn, MD, MPH [facility] 11/24/2018, 8:40 AM

## 2018-11-25 ENCOUNTER — Ambulatory Visit (INDEPENDENT_AMBULATORY_CARE_PROVIDER_SITE_OTHER): Payer: Medicare Other | Admitting: Licensed Clinical Social Worker

## 2018-11-25 ENCOUNTER — Other Ambulatory Visit: Payer: Self-pay

## 2018-11-25 ENCOUNTER — Other Ambulatory Visit: Payer: Self-pay | Admitting: Gastroenterology

## 2018-11-25 ENCOUNTER — Encounter (HOSPITAL_COMMUNITY): Payer: Self-pay | Admitting: Gastroenterology

## 2018-11-25 ENCOUNTER — Other Ambulatory Visit: Payer: Self-pay | Admitting: Pharmacist

## 2018-11-25 DIAGNOSIS — F4312 Post-traumatic stress disorder, chronic: Secondary | ICD-10-CM | POA: Diagnosis not present

## 2018-11-25 DIAGNOSIS — Z96643 Presence of artificial hip joint, bilateral: Secondary | ICD-10-CM | POA: Diagnosis not present

## 2018-11-25 DIAGNOSIS — M21752 Unequal limb length (acquired), left femur: Secondary | ICD-10-CM | POA: Diagnosis not present

## 2018-11-25 DIAGNOSIS — Z471 Aftercare following joint replacement surgery: Secondary | ICD-10-CM | POA: Diagnosis not present

## 2018-11-25 LAB — SURGICAL PATHOLOGY

## 2018-11-25 NOTE — Anesthesia Postprocedure Evaluation (Signed)
Anesthesia Post Note  Patient: Joan [name]  Procedure(s) Performed: COLONOSCOPY WITH PROPOFOL (N/A ) ESOPHAGOGASTRODUODENOSCOPY (EGD) WITH PROPOFOL (N/A ) BIOPSY     Patient location during evaluation: PACU Anesthesia Type: MAC Level of consciousness: awake Pain management: pain level controlled Vital Signs Assessment: post-procedure vital signs reviewed and stable Respiratory status: spontaneous breathing Cardiovascular status: stable Postop Assessment: no apparent nausea or vomiting Anesthetic complications: no    Last Vitals:  Vitals:   11/24/18 0930 11/24/18 0940  BP: 131/79 (!) 137/92  Pulse: 64 64  Resp: (!) 23 20  Temp:    SpO2: 100% 100%    Last Pain:  Vitals:   11/25/18 1038  TempSrc:   PainSc: 0-No pain   Pain Goal:                   Huston Foley

## 2018-11-25 NOTE — Telephone Encounter (Signed)
[employer], rep stated application was still in process due to needing [place] expiration date. Obtained from [employer] and refaxed form, Will follow up on Friday to follow up on rx clarification with [facility].

## 2018-11-25 NOTE — Progress Notes (Signed)
Virtual Visit via Telephone Note  I connected with Joan [name] on 11/25/18 at  1:00 PM EDT by telephone and verified that I am speaking with the correct person using two identifiers.   I discussed the limitations, risks, security and privacy concerns of performing an evaluation and management service by telephone and the availability of in person appointments. I also discussed with the patient that there may be a patient responsible charge related to this service. The patient expressed understanding and agreed to proceed.  The patient was provided an opportunity to ask questions and all were answered. The patient agreed with the plan and demonstrated an understanding of the instructions.   The patient was advised to call back or seek an in-person evaluation if the symptoms worsen or if the condition fails to improve as anticipated.  I provided 52 minutes of non-face-to-face time during this encounter.  THERAPIST PROGRESS NOTE  Session Time: 1:03 PM to 1:55 PM  Participation Level: Active  Behavioral Response: CasualAlertappropriate  Type of Therapy: Individual Therapy  Treatment Goals addressed: paranoia, hallucinations, though blocking, trauma, coping  Interventions: Solution Focused, Strength-based and Other: education on trauma, building therapeutic rapport, developing treatment goals, coping  Summary: Joan [name] is a 58 y.o. female who presents with update to significant events and changes in symptoms and functioning as this patient is new to this therapist and transfer from previous therapist.  Also completed treatment plan to discuss focus of treatment.  In discussion patient was able to provide background information about her family and her relationships.  Shared went to son's wedding and it was good even though everything around it was was not.  She was triggered when went back home to [place] new was going to happen but wanted to prove to herself she could handle it but  was wrong.  Shared she is 6 years clean was an addict in the past and that is why she left there.  Reviewed as a result of relapse she had financial difficulties could not pay bills, borrow money from brother, altercation with brother accusing her of stealing money that she was giving him to pay him back.  Shared problem was that he had her clothes and medications and won't give them back.  Explained that she had to have her medications for her medical issues, shared that she has 35% lung capacity and needed medications.  Reviewed treatment for lungs include oxygen at night, inhaler and nebulizer 2 times a day.  Went to police station because did not think was a good idea to go on her own to get her things.  When she went to get her things with police they used derogatory names about her being an addict, brother was very worked up able though to get her things.  Described feeling humiliated.  Reviewed positive choice for patient not to go on her own indicated by how agitated her brother got.  Patient shares never stole anything, wife eventually found money, discussed they overreacted and in the process were hurtful to patient.  Patient shares in  completing treatment plan that she is the one who always has to overlook things and move on, her feelings never listened to, does not identify supports, therapist pointing out why therapy can be a good support for her.  Patient shares her feeling related to incident that there was not apology by wife and felt when you were totally wrong about something patient deserved an apology.  Therapist able to process patient's feelings  related to recent events and also able to validate her feelings.  Reviewed 1 of treatment goals is to work on paranoia, hallucinations. Current life patient provided some history of trauma describes "my life has been held for years" had a baby at 75, married left him my kids taken away experience, alcohol abuse, that it was on and on and never  stopped.  Remarried still going back to drugs locked down, was in penitentiary when second husband died and never got over that as it was not there when he died.  They had a good relationship until the drugs came into it.  Describes believe he was alive but she would see him.  Describes hear things, people talking hearing things from under the house even though no cellar challenge is the reality.  Thinks family talking about her, do not trust anybody describes paranoia when using eventually caused her to leave the state, relates still some paranoia Provided education about trauma that trauma impacts how we see the world and or self, particularly feeling of safety is violated leads to lack of trust and this could attribute to paranoia.  Reviewed working on trauma, helping patient challenge these distortions as they are connected to trauma no longer applicable to current like. Trauma is the experience of the past being the present and work of trauma is storing memories so they are not longer impacting her in the present. Discussed addiction history, clean for six years until recent relapse. Her way of using cocaine is cook and smoke it( crack).  Reviewed recovery plan, that is why she moved from [place], been here for 2 to 3 years, if she is not around places and people it helps her stay clean and does not even think about it.  Discussed how addiction of crack can relate to some paranoia although wants drug is cleared system body tends to try to go back to homeostasis, but still there can be long-term effects and may have some long-term impact on her as well. Has used medication Effexor for a significant amount of time, Wellbutrin helped with depression and smoking sensation want to quit smoking prescribed by primary care doctor Dr. Sharlet Salina.  In session patient also lost track of conversation says she does that frequently, is bothered and irritated by that.  Nervous to a certain extent normalized that people  lose track of thought that patient may have it to a more extreme extent, related more helpful to be kinder to herself more excepting may help with getting herself back on track, strategies to manage this frustration. Provided strength based on supportive intervention. Suicidal/Homicidal: No  Plan: Return again in 3 weeks.2.Coping, mood regulation, supportive, trauma  Diagnosis: Axis I: PTSD    Axis II: No diagnosis    [name], LCSW 11/25/2018

## 2018-11-25 NOTE — Patient Outreach (Signed)
[facility]) Care Management  [place] 11/25/2018  Joan [name] 11/14/1960 [id number]  Incoming call from patient re: patient assistance program applications.  States she received denial letter from [employer] but does not know for which medication.  She reports denial due to missing medication quantity and invalid state ID number.    We reviewed that pulmonary office is working on her applications, not [facility].  Patient states she has left message with office already.  I will also relay message to office and encouraged patient to f/u again with office later this week.   Ralene Bathe, PharmD, [place] [phone]

## 2018-11-26 ENCOUNTER — Ambulatory Visit (INDEPENDENT_AMBULATORY_CARE_PROVIDER_SITE_OTHER): Payer: Medicare Other | Admitting: Acute Care

## 2018-11-26 ENCOUNTER — Inpatient Hospital Stay: Admit: 2018-11-26 | Payer: Medicare Other | Admitting: Orthopedic Surgery

## 2018-11-26 ENCOUNTER — Ambulatory Visit
Admission: RE | Admit: 2018-11-26 | Discharge: 2018-11-26 | Disposition: A | Discharge status: Home / Self Care | Payer: Medicare Other | Source: Ambulatory Visit | Attending: Acute Care | Admitting: Acute Care

## 2018-11-26 ENCOUNTER — Other Ambulatory Visit: Payer: Self-pay

## 2018-11-26 ENCOUNTER — Encounter: Payer: Self-pay | Admitting: Acute Care

## 2018-11-26 VITALS — BP 104/60 | HR 74 | Temp 97.2°F | Ht 68.0 in | Wt 197.2 lb

## 2018-11-26 DIAGNOSIS — F17210 Nicotine dependence, cigarettes, uncomplicated: Secondary | ICD-10-CM

## 2018-11-26 DIAGNOSIS — Z87891 Personal history of nicotine dependence: Secondary | ICD-10-CM

## 2018-11-26 DIAGNOSIS — Z122 Encounter for screening for malignant neoplasm of respiratory organs: Secondary | ICD-10-CM

## 2018-11-26 SURGERY — TOTAL HIP REVISION
Anesthesia: Spinal | Site: Hip | Laterality: Left

## 2018-11-26 NOTE — Progress Notes (Signed)
Shared Decision Making Visit Lung Cancer Screening Program [phone])   Eligibility:  Age 58 y.o.  Pack Years Smoking History Calculation 43 pack year smoking history (# packs/per year x # years smoked)  Recent History of coughing up blood  no  Unexplained weight loss? no ( >Than 15 pounds within the last 6 months )  Prior History Lung / other cancer no (Diagnosis within the last 5 years already requiring surveillance chest CT Scans).  Smoking Status Current Smoker  Former Smokers: Years since quit: NA  Quit Date: NA  Visit Components:  Discussion included one or more decision making aids. yes  Discussion included risk/benefits of screening. yes  Discussion included potential follow up diagnostic testing for abnormal scans. yes  Discussion included meaning and risk of over diagnosis. yes  Discussion included meaning and risk of False Positives. yes  Discussion included meaning of total radiation exposure. yes  Counseling Included:  Importance of adherence to annual lung cancer LDCT screening. yes  Impact of comorbidities on ability to participate in the program. yes  Ability and willingness to under diagnostic treatment. yes  Smoking Cessation Counseling:  Current Smokers:   Discussed importance of smoking cessation. yes  Information about tobacco cessation classes and interventions provided to patient. yes  Patient provided with "ticket" for LDCT Scan. yes  Symptomatic Patient. no  Counseling  Diagnosis Code: Tobacco Use Z72.0  Asymptomatic Patient yes  Counseling (Intermediate counseling: > three minutes counseling) [id number]  Former Smokers:   Discussed the importance of maintaining cigarette abstinence. yes  Diagnosis Code: Personal History of Nicotine Dependence. [id number]  Information about tobacco cessation classes and interventions provided to patient. Yes  Patient provided with "ticket" for LDCT Scan. yes  Written Order for Lung Cancer  Screening with LDCT placed in Epic. Yes (CT Chest Lung Cancer Screening Low Dose W/O CM) [id number] Z12.2-Screening of respiratory organs Z87.891-Personal history of nicotine dependence  BP 104/60 (BP Location: Left Arm, Cuff Size: Normal)   Pulse 74   Temp (!) 97.2 F (36.2 C) (Oral)   Ht 5\' 8"  (1.727 m)   Wt 197 lb 3.2 oz (89.4 kg)   SpO2 94%   BMI 29.98 kg/m    I have spent 25 minutes of face to face time with Ms. Ehrgott discussing the risks and benefits of lung cancer screening. We viewed a power point together that explained in detail the above noted topics. We paused at intervals to allow for questions to be asked and answered to ensure understanding.We discussed that the single most powerful action that she can take to decrease her risk of developing lung cancer is to quit smoking. We discussed whether or not she is ready to commit to setting a quit date.She is currently on Chantix and has set a quit date of 11/30/2018.  We discussed options for tools to aid in quitting smoking including nicotine replacement therapy, non-nicotine medications, support groups, Quit Smart classes, and behavior modification. We discussed that often times setting smaller, more achievable goals, such as eliminating 1 cigarette a day for a week and then 2 cigarettes a day for a week can be helpful in slowly decreasing the number of cigarettes smoked. This allows for a sense of accomplishment as well as providing a clinical benefit. I gave her the " Be Stronger Than Your Excuses" card with contact information for community resources, classes, free nicotine replacement therapy, and access to mobile apps, text messaging, and on-line smoking cessation help. I have also given her my card  and contact information in the event she needs to contact me. We discussed the time and location of the scan, and that either Doroteo Glassman RN or I will call with the results within 24-48 hours of receiving them. I have offered her  a copy of  the power point we viewed  as a resource in the event they need reinforcement of the concepts we discussed today in the office. The patient verbalized understanding of all of  the above and had no further questions upon leaving the office. They have my contact information in the event they have any further questions.  I spent 3 minutes counseling on smoking cessation and the health risks of continued tobacco abuse.  I explained to the patient that there has been a high incidence of coronary artery disease noted on these exams. I explained that this is a non-gated exam therefore degree or severity cannot be determined. This patient is currently on statin therapy. I have asked the patient to follow-up with their PCP regarding any incidental finding of coronary artery disease and management with diet or medication as their PCP  feels is clinically indicated. The patient verbalized understanding of the above and had no further questions upon completion of the visit.  Pt is in NAD today. No wheezing. Saturation on RA is 94%. She is using Chantix , and working on quitting smoking. She has set a quit date of 11/30/2018.  She has an OV 11/30/2018 for follow up.  She has follow up with Dr. Gwenlyn Found 12/09/2018      [name], NP 11/26/2018 11:39 AM

## 2018-11-26 NOTE — Patient Instructions (Signed)
Thank you for participating in the [facility]. It was our pleasure to meet you today. We will call you with the results of your scan within the next few days. Your scan will be assigned a Lung RADS category score by the physicians reading the scans.  This Lung RADS score determines follow up scanning.  See below for description of categories, and follow up screening recommendations. We will be in touch to schedule your follow up screening annually or based on recommendations of our providers. We will fax a copy of your scan results to your Primary Care Physician, or the physician who referred you to the program, to ensure they have the results. Please call the office if you have any questions or concerns regarding your scanning experience or results.  Our office number is [phone]. Please speak with Denise Phelps, RN. She is our Lung Cancer Screening RN. If she is unavailable when you call, please have the office staff send her a message. She will return your call at her earliest convenience. Remember, if your scan is normal, we will scan you annually as long as you continue to meet the criteria for the program. (Age 55-77, Current smoker or smoker who has quit within the last 15 years). If you are a smoker, remember, quitting is the single most powerful action that you can take to decrease your risk of lung cancer and other pulmonary, breathing related problems. We know quitting is hard, and we are here to help.  Please let us know if there is anything we can do to help you meet your goal of quitting. If you are a former smoker, congratulations. We are proud of you! Remain smoke free! Remember you can refer friends or family members through the number above.  We will screen them to make sure they meet criteria for the program. Thank you for helping us take better care of you by participating in Lung Screening.  Lung RADS Categories:  Lung RADS 1: no nodules  or definitely non-concerning nodules.  Recommendation is for a repeat annual scan in 12 months.  Lung RADS 2:  nodules that are non-concerning in appearance and behavior with a very low likelihood of becoming an active cancer. Recommendation is for a repeat annual scan in 12 months.  Lung RADS 3: nodules that are probably non-concerning , includes nodules with a low likelihood of becoming an active cancer.  Recommendation is for a 6-month repeat screening scan. Often noted after an upper respiratory illness. We will be in touch to make sure you have no questions, and to schedule your 6-month scan.  Lung RADS 4 A: nodules with concerning findings, recommendation is most often for a follow up scan in 3 months or additional testing based on our provider's assessment of the scan. We will be in touch to make sure you have no questions and to schedule the recommended 3 month follow up scan.  Lung RADS 4 B:  indicates findings that are concerning. We will be in touch with you to schedule additional diagnostic testing based on our provider's  assessment of the scan.   

## 2018-11-27 ENCOUNTER — Other Ambulatory Visit: Payer: Self-pay | Admitting: Pharmacist

## 2018-11-27 DIAGNOSIS — J449 Chronic obstructive pulmonary disease, unspecified: Secondary | ICD-10-CM

## 2018-11-27 MED ORDER — ALBUTEROL SULFATE (2.5 MG/3ML) 0.083% IN NEBU: 2.5000 mg | INHALATION_SOLUTION | Freq: Two times a day (BID) | RESPIRATORY_TRACT | 3 refills | Status: DC

## 2018-11-27 NOTE — Telephone Encounter (Signed)
Pharmacy>> Thanks so much for your assistance  Joan [name]>> Please refill Joan [name]'s nebs Thanks

## 2018-11-27 NOTE — Progress Notes (Signed)
Patient requested refill of albuterol when discussing patient assistance.

## 2018-11-27 NOTE — Telephone Encounter (Signed)
[employer], rep Marcie Bal advised patient has been Approved for patient Assistance for Chantix through 02/25/2019. Medication will mailed to the office in 5-7 business days.  Patient ID# [id number] Phone# [phone]   Called patient to advise. Discussed approvals. Patient is requesting an new prescription for Albuterol nebulizers.  9:59 AM Beatriz Chancellor, [name]

## 2018-11-30 ENCOUNTER — Other Ambulatory Visit: Payer: Self-pay

## 2018-11-30 ENCOUNTER — Ambulatory Visit (INDEPENDENT_AMBULATORY_CARE_PROVIDER_SITE_OTHER): Payer: Medicare Other | Admitting: Acute Care

## 2018-11-30 ENCOUNTER — Encounter: Payer: Self-pay | Admitting: Acute Care

## 2018-11-30 DIAGNOSIS — Z720 Tobacco use: Secondary | ICD-10-CM | POA: Diagnosis not present

## 2018-11-30 DIAGNOSIS — J9611 Chronic respiratory failure with hypoxia: Secondary | ICD-10-CM

## 2018-11-30 DIAGNOSIS — G4733 Obstructive sleep apnea (adult) (pediatric): Secondary | ICD-10-CM

## 2018-11-30 DIAGNOSIS — J449 Chronic obstructive pulmonary disease, unspecified: Secondary | ICD-10-CM | POA: Diagnosis not present

## 2018-11-30 DIAGNOSIS — R911 Solitary pulmonary nodule: Secondary | ICD-10-CM

## 2018-11-30 DIAGNOSIS — Z9989 Dependence on other enabling machines and devices: Secondary | ICD-10-CM

## 2018-11-30 NOTE — Addendum Note (Signed)
Addended by: Jannette Spanner on: 11/30/2018 02:40 PM   Modules accepted: Orders

## 2018-11-30 NOTE — Assessment & Plan Note (Signed)
Continue Symbicort 160 Continue Combivent Continue albuterol nebulized meds every 6-8 hours as needed for shortness of breath or wheezing Stop smoking Follow up in 2 months with Judson Roch NP Please contact office for sooner follow up if symptoms do not improve or worsen or seek emergency care

## 2018-11-30 NOTE — Assessment & Plan Note (Signed)
Set quit date 10/5 Has been using Chantix daily 11/23/2018 She smoked 1 ppd prior to starting Chanrix. Plan Keep up the good work. Let us know if you need help.

## 2018-11-30 NOTE — Progress Notes (Addendum)
History of Present Illness Joan [name] is a 58 y.o. female  current every day smoker with COPD.   Smoking history: Current everyday smoker. 1 pack/day. 44-pack-year smoking history. Maintenance:Symbicort160,  Daliresp, and Combivent Nebulizer >> Albuterol  Patient of Joan [name] Followed by Lung Cancer Screening Program Last antibiotic 11/06/2018   11/30/2018  Pt. Presents for follow up. She states she has been doing well. She states she continues to have shortness of breath. Today is her quit day, and she has not smoked today.I have encouraged her to keep up the good work . She has been taking Chantix for about 2 weeks. We have her approved for free Chantix through the assistance of pharmacy which we greatly appreciate. She feels this will help her be successful in quitting smoking. She has been compliant with all medications. She is postponing her L hip surgery until the end of November to work on quitting smoking and to allow her to recover from a recent COPD flare. She states she is using her nebs 2-3 times daily. She is compliant with all of her medications. She is in a wheelchair again today for her breathing. She states she is having to use her oxygen during the day more frequently . She had a CT Chest for lung cancer screening 10/1. The scan has not been read as of today. We will call her with the results.She denies any fever, chest pain, orthopnea or hemoptysis.She does have an appointment with Joan [name], cards 10/14. Joan [name] has not had PFT's in 2 years. We will schedule these today.  We walked the patient on RA. She did not meet criteria for daytime  oxygen  Test Results: 11/06/2018 - CBC Diff - Eos 5.5, eos absolute 0.6  11/09/2018 CXR FINDINGS: The chest is hyperexpanded with attenuation of the pulmonary vasculature consistent with emphysema. Lungs are clear. No pneumothorax or pleural fluid. Heart size is normal. Emphysema without acute disease.  NPSG 02/2017 >> no OSA HST  04/2017 15/h  11/2017 CT Chest Stable 4 mm right upper lobe nodule and 2 mm left upper lobe nodule. A previously demonstrated 3 mm left upper lobe nodule is no longer seen. No follow-up needed if patient is low-risk (and has no known or suspected primary neoplasm). Non-contrast chest CT can be considered in 12 months if patient is high-risk. This recommendation follows the consensus statement: Guidelines for Management of Incidental Pulmonary Nodules Detected on CT Images: From the Fleischner Society 2017; Radiology 2017; 284:228-243. Changes of COPD with centrilobular emphysema. Calcific coronary artery and aortic atherosclerosis.  10/2016 PFTs showed ratio 49, FEV1 of 36%, FVC 58% and DLCO of 34% consistent with severe obstruction.  11/2016 CT chestbullous changes BL, 19mRUl &2 mm LULnodules >>stable 11/2017  09/2016 echo normal  CBC Latest Ref Rng & Units 05/24/2018 05/23/2018 05/22/2018  WBC 4.0 - 10.5 K/uL 17.6(H) 17.7(H) 21.3(H)  Hemoglobin 12.0 - 15.0 g/dL 12.4 12.9 12.7  Hematocrit 36.0 - 46.0 % 39.8 40.5 38.8  Platelets 150 - 400 K/uL 308 328 306    BMP Latest Ref Rng & Units 05/24/2018 05/22/2018 05/21/2018  Glucose 70 - 99 mg/dL 113(H) 251(H) 322(H)  BUN 6 - 20 mg/dL 27(H) 22(H) 14  Creatinine 0.44 - 1.00 mg/dL 1.14(H) 1.20(H) 1.28(H)  Sodium 135 - 145 mmol/L 140 132(L) 132(L)  Potassium 3.5 - 5.1 mmol/L 3.9 4.3 3.3(L)  Chloride 98 - 111 mmol/L 105 95(L) 91(L)  CO2 22 - 32 mmol/L 24 25 25   Calcium 8.9 - 10.3 mg/dL  9.2 9.4 9.1    BNP    Component Value Date/Time   BNP 114.5 (H) 12/29/2017 0609    ProBNP No results [name] for: PROBNP  PFT    Component Value Date/Time   FEV1PRE 1.05 10/30/2016 0847   FEV1POST 1.23 10/30/2016 0847   FVCPRE 2.16 10/30/2016 0847   FVCPOST 2.53 10/30/2016 0847   TLC 5.36 10/30/2016 0847   DLCOUNC 9.40 10/30/2016 0847   PREFEV1FVCRT 49 10/30/2016 0847   PSTFEV1FVCRT 49 10/30/2016 0847    Dg Chest 2 View  Result  Date: 11/09/2018 CLINICAL DATA:  History of COPD. EXAM: CHEST - 2 VIEW COMPARISON:  CT chest 12/03/2016. Single-view of the chest 05/20/2018. PA and lateral chest 12/28/2017. FINDINGS: The chest is hyperexpanded with attenuation of the pulmonary vasculature consistent with emphysema. Lungs are clear. No pneumothorax or pleural fluid. Heart size is normal. IMPRESSION: Emphysema without acute disease. Electronically Signed   By: Inge Rise M.D.   On: 11/09/2018 12:53     Past medical hx Past Medical History:  Diagnosis Date   Acute respiratory failure with hypoxia ([place])    Alcoholism and drug addiction in family    Anxiety    Arthritis    "knees, hips, lower back" (09/25/2016)   Asthma    Bipolar disorder ([place])    Chronic bronchitis ([place])    "all the time" (09/25/2016)   Chronic leg pain    RLE   Chronic lower back pain    COPD (chronic obstructive pulmonary disease) ([place])    Depression    Emphysema of lung (HCC)    GERD (gastroesophageal reflux disease)    Headache    Hepatitis C    "treated back in 2001-2002"   High cholesterol    Hypertension    Hypothyroidism    Incarcerated ventral hernia 04/04/2017   On home oxygen therapy    "2L; at nighttime" (04/04/2017)   OSA on CPAP    Pneumonia    "all the time" (09/25/2016)   Positive PPD    with negative chest x ray   Tachycardia    patient reports with exertion ; denies any other conditions    Thyroid disease      Social History   Tobacco Use   Smoking status: Current Every Day Smoker    Packs/day: 1.00    Years: 44.00    Pack years: 44.00    Types: Cigarettes   Smokeless tobacco: Never Used   Tobacco comment: Smoker 1-1.5 PPD every day, 11-20-2018 "i got myself down to half a pack", i am gonna be going on chantix on the 28th"  Substance Use Topics   Alcohol use: Yes   Drug use: Yes    Types: "Crack" cocaine    Comment: 04/04/2017 "nothing since 01/12/2012"    Joan [name] reports that she  has been smoking cigarettes. She has a 44.00 pack-year smoking history. She has never used smokeless tobacco. She reports current alcohol use. She reports current drug use. Drug: "Crack" cocaine.  Tobacco Cessation: Current every day smoker with 1.5 PPD smoking history She denies any crack cocaine use for the last 5-6 years. She has set today as her quit date. She has been smoking 1 pack per day up until today.  Past surgical hx, Family hx, Social hx all reviewed.  Current Outpatient Medications on File Prior to Visit  Medication Sig   acyclovir (ZOVIRAX) 400 MG tablet Take 1 tablet (400 mg total) by mouth 2 (two) times daily. (Patient taking differently:  Take 400 mg by mouth 2 (two) times daily as needed (cold sores). )   albuterol (PROVENTIL) (2.5 MG/3ML) 0.083% nebulizer solution Take 3 mLs (2.5 mg total) by nebulization 2 (two) times daily. And every 6 hours as needed.  DX: J44.9   atenolol (TENORMIN) 25 MG tablet Take 1 tablet (25 mg total) by mouth daily.   atorvastatin (LIPITOR) 20 MG tablet Take 1 tablet (20 mg total) by mouth daily.   baclofen (LIORESAL) 10 MG tablet Take 10 mg by mouth at bedtime.   budesonide-formoterol (SYMBICORT) 160-4.5 MCG/ACT inhaler Inhale 2 puffs into the lungs 2 (two) times daily.   buPROPion (WELLBUTRIN XL) 150 MG 24 hr tablet Take 1 tablet (150 mg total) by mouth 2 (two) times daily. (Patient taking differently: Take 300 mg by mouth daily. )   cyclobenzaprine (FLEXERIL) 10 MG tablet Take 1 tablet (10 mg total) by mouth at bedtime.   diphenhydrAMINE (BENADRYL) 25 MG tablet Take 25 mg by mouth 2 (two) times daily.    docusate sodium (COLACE) 100 MG capsule Take 100 mg by mouth daily.   gabapentin (NEURONTIN) 300 MG capsule TAKE 1 CAPSULE BY MOUTH  TWICE DAILY (Patient taking differently: Take 600 mg by mouth at bedtime. Takes '600mg'$  QHS)   glucose blood (ONE TOUCH ULTRA TEST) test strip Use to check blood sugars twice a day   hydrochlorothiazide  (HYDRODIURIL) 25 MG tablet Take 1 tablet (25 mg total) by mouth daily.   Ipratropium-Albuterol (COMBIVENT RESPIMAT) 20-100 MCG/ACT AERS respimat Inhale 1 puff into the lungs every 6 (six) hours as needed for wheezing. (Patient taking differently: Inhale 1 puff into the lungs every 6 (six) hours. )   levothyroxine (SYNTHROID, LEVOTHROID) 25 MCG tablet Take 1 tablet (25 mcg total) by mouth daily before breakfast.   Liniments (SALONPAS ARTHRITIS PAIN RELIEF EX) Apply 1 patch topically daily as needed (pain).   Melatonin 10 MG TABS Take 10 mg by mouth at bedtime.   Misc Natural Products (7-KETO LEAN) CAPS Take 1 capsule by mouth 2 (two) times daily. Keto Diet Supplements   montelukast (SINGULAIR) 10 MG tablet Take 1 tablet (10 mg total) by mouth at bedtime.   Multiple Vitamins-Minerals (HAIR/SKIN/NAILS/BIOTIN) TABS Take 1 tablet by mouth daily.   Multiple Vitamins-Minerals (WOMENS 50+ MULTI VITAMIN/MIN PO) Take 1 tablet by mouth daily.   Na Sulfate-K Sulfate-Mg Sulf 17.5-3.13-1.6 GM/177ML SOLN Take 1 kit by mouth as directed.   naproxen (NAPROSYN) 500 MG tablet Take 1 tablet (500 mg total) by mouth 2 (two) times daily with a meal. (Patient taking differently: Take 500 mg by mouth 2 (two) times daily as needed (pain). )   nystatin-triamcinolone ointment (MYCOLOG) Apply 1 application topically 2 (two) times daily as needed (applied to skin folds/groins/under breast skin irritation rash.).   OXYGEN Inhale 2 L into the lungs at bedtime. May use 2.5 - 3 L during the day with strenuous activity   pantoprazole (PROTONIX) 40 MG tablet Take 1 tablet (40 mg total) by mouth daily.   roflumilast (DALIRESP) 500 MCG TABS tablet Take 1 tablet (500 mcg total) by mouth daily.   Simethicone (GAS RELIEF 80 PO) Take 80 mg by mouth 2 (two) times daily.   traZODone (DESYREL) 100 MG tablet Take 1 tablet (100 mg total) by mouth at bedtime.   varenicline (CHANTIX STARTING MONTH PAK) 0.5 MG X 11 & 1 MG X 42  tablet Take one 0.5 mg tablet by mouth once daily for 3 days, then increase to one 0.5  mg tablet twice daily for 4 days, then increase to one 1 mg tablet twice daily.   venlafaxine XR (EFFEXOR-XR) 150 MG 24 hr capsule TAKE 1 CAPSULE DAILY WITH  BREAKFAST (Patient taking differently: Take 150 mg by mouth daily with breakfast. )   No current facility-administered medications on file prior to visit.      Allergies  Allergen Reactions   Keflex [Cephalexin] Other (See Comments)    unspecified   Prednisone Other (See Comments)    "counter reacts"   Shellfish Allergy Other (See Comments)    Stomach cramps   Tetracyclines & Related Other (See Comments)    unspecified   Dilaudid [Hydromorphone Hcl] Other (See Comments)    "Lethargy"   Incruse Ellipta [Umeclidinium Bromide] Nausea Only   Tuberculin Tests Other (See Comments)    Review Of Systems:  Constitutional:   No  weight loss, night sweats,  Fevers, chills, + fatigue, or  lassitude.  HEENT:   No headaches,  Difficulty swallowing,  Tooth/dental problems, or  Sore throat,                No sneezing, itching, ear ache, nasal congestion, post nasal drip,   CV:  No chest pain,  Orthopnea, PND, swelling in lower extremities, anasarca, dizziness, palpitations, syncope.   GI  No heartburn, indigestion, abdominal pain, nausea, vomiting, diarrhea, change in bowel habits, loss of appetite, bloody stools.   Resp: + shortness of breath with exertion, not  at rest.  No excess mucus, no productive cough,  No non-productive cough,  No coughing up of blood.  No change in color of mucus.  No wheezing.  No chest wall deformity  Skin: no rash or lesions.  GU: no dysuria, change in color of urine, no urgency or frequency.  No flank pain, no hematuria   MS:  No joint pain or swelling.  No decreased range of motion.  No back pain.  Psych:  No change in mood or affect. No depression or anxiety.  No memory loss.   Vital Signs BP 118/60 (BP  Location: Left Arm, Cuff Size: Large)    Pulse 69    Temp 98 F (36.7 C) (Oral)    Ht 5' 8"  (1.727 m)    Wt 198 lb 9.6 oz (90.1 kg)    SpO2 94%    BMI 30.20 kg/m    Physical Exam:  General- No distress,  A&Ox3, pleasant ENT: No sinus tenderness, TM clear, pale nasal mucosa, no oral exudate,no post nasal drip, no LAN Cardiac: S1, S2, regular rate and rhythm, no murmur Chest: No wheeze/ rales/ dullness; no accessory muscle use, no nasal flaring, no sternal retractions Abd.: Soft Non-tender, ND, NT, Body mass index is 30.2 kg/m. Ext: No clubbing cyanosis, trace edema Neuro:  normal strength, deconditioned at baseline, MAE x 4, A&O x 3 Skin: No rashes, No lesions, warm and dry Psych: normal mood and behavior   Assessment/Plan  Chronic respiratory failure with hypoxia (HCC) Use oxygen as needed to maintain sats of > 88% to 92%  OSA on CPAP Compliant on CPAP  COPD, severe (HCC) Continue Symbicort 160 Continue Combivent Continue albuterol nebulized meds every 6-8 hours as needed for shortness of breath or wheezing Stop smoking Follow up in 2 months with Judson Roch NP Please contact office for sooner follow up if symptoms do not improve or worsen or seek emergency care   Pulmonary nodule CT chest done 11/26/2018 Has not yet been read by radiology Called GI, they  will get scan read when able. Stated they have been short staffed. We will call with results when available  Tobacco abuse Set quit date 10/5 Has been using Chantix daily 11/23/2018 She smoked 1 ppd prior to starting Chanrix. Plan Keep up the good work. Let us know if you need help.   We will schedule PFT's as she has not had them in 2 years and is experiencing worsening dyspnea on exertion  This appointment was 30 min long with over 50% of the time in direct face-to-face patient care, assessment, plan of care, and follow-up.   [name], NP 11/30/2018  11:49 AM

## 2018-11-30 NOTE — Patient Instructions (Addendum)
It is good to see you today. Congratulations on setting today as your quit date.  Continue to be strong , and do not smoke. I know this is hard, but I know you can do this.  Chronic respiratory failure with hypoxia (HCC) Use oxygen as needed to maintain sats of > 88% to 92%  OSA on CPAP Compliant on CPAP  COPD, severe (HCC) Continue Symbicort 160 Continue Combivent Continue albuterol nebulized meds every 6-8 hours as needed for shortness of breath or wheezing Stop smoking Schedule for PFT's today Follow up in 2 months with Judson Roch NP Please contact office for sooner follow up if symptoms do not improve or worsen or seek emergency care   Pulmonary nodule CT chest done 11/26/2018 Has not yet been read by radiology Called GI, they will get scan read when able. Stated they have been short staffed. We will call with results when available  Tobacco abuse Set quit date 10/5 Has been using Chantix daily 11/23/2018 She smoked 1 ppd prior to starting Chanrix. Plan Keep up the good work. Let us know if you need help.  Follow up with Judson Roch in 2 months, or before if needed.

## 2018-11-30 NOTE — Assessment & Plan Note (Signed)
CT chest done 11/26/2018 Has not yet been read by radiology Called GI, they will get scan read when able. Stated they have been short staffed. We will call with results when available

## 2018-11-30 NOTE — Assessment & Plan Note (Signed)
Use oxygen as needed to maintain sats of > 88% to 92%

## 2018-11-30 NOTE — Assessment & Plan Note (Signed)
Compliant on CPAP

## 2018-12-02 NOTE — Telephone Encounter (Signed)
Done

## 2018-12-04 NOTE — Telephone Encounter (Signed)
Patient's Chantix medication has been received my office from [employer] Patient Assistance Program. Called patient and advised. Will place medication pickup bin to front door.  12:00 PM Beatriz Chancellor, [name]

## 2018-12-07 ENCOUNTER — Telehealth: Payer: Self-pay | Admitting: Pharmacist

## 2018-12-07 NOTE — Telephone Encounter (Signed)
Called pt on 12/07/18 at 11:15 AM and left HIPAA-compliant VM with instructions to call back pharmacy phone number at [facility]. Will f/u with pt on 12/11/18.  Wanted to f/u regarding success with implementation of Chantix and non-pharmacologic techniques (eating a lollipop when she has the urge to smoke). Pt's smoking triggers include 1) a morning ritual of getting out of bed and having a cigarette with a cup of coffee 2) stress.

## 2018-12-08 ENCOUNTER — Encounter: Payer: Self-pay | Admitting: Internal Medicine

## 2018-12-09 ENCOUNTER — Encounter: Payer: Self-pay | Admitting: Cardiovascular Disease

## 2018-12-09 ENCOUNTER — Other Ambulatory Visit: Payer: Self-pay

## 2018-12-09 ENCOUNTER — Ambulatory Visit (INDEPENDENT_AMBULATORY_CARE_PROVIDER_SITE_OTHER): Payer: Medicare Other | Admitting: Cardiovascular Disease

## 2018-12-09 DIAGNOSIS — I10 Essential (primary) hypertension: Secondary | ICD-10-CM

## 2018-12-09 DIAGNOSIS — J449 Chronic obstructive pulmonary disease, unspecified: Secondary | ICD-10-CM | POA: Diagnosis not present

## 2018-12-09 DIAGNOSIS — J9611 Chronic respiratory failure with hypoxia: Secondary | ICD-10-CM | POA: Diagnosis not present

## 2018-12-09 DIAGNOSIS — Z720 Tobacco use: Secondary | ICD-10-CM

## 2018-12-09 DIAGNOSIS — R079 Chest pain, unspecified: Secondary | ICD-10-CM

## 2018-12-09 DIAGNOSIS — E785 Hyperlipidemia, unspecified: Secondary | ICD-10-CM | POA: Diagnosis not present

## 2018-12-09 DIAGNOSIS — J441 Chronic obstructive pulmonary disease with (acute) exacerbation: Secondary | ICD-10-CM | POA: Diagnosis not present

## 2018-12-09 LAB — BASIC METABOLIC PANEL
BUN/Creatinine Ratio: 20 (ref 9–23)
BUN: 18 mg/dL (ref 6–24)
CO2: 25 mmol/L (ref 20–29)
Calcium: 9.7 mg/dL (ref 8.7–10.2)
Chloride: 97 mmol/L (ref 96–106)
Creatinine, Ser: 0.9 mg/dL (ref 0.57–1.00)
GFR calc Af Amer: 82 mL/min/{1.73_m2} (ref >59)
GFR calc non Af Amer: 71 mL/min/{1.73_m2} (ref >59)
Glucose: 129 mg/dL — ABNORMAL HIGH (ref 65–99)
Potassium: 3.7 mmol/L (ref 3.5–5.2)
Sodium: 140 mmol/L (ref 134–144)

## 2018-12-09 LAB — CBC
Hematocrit: 39.5 % (ref 34.0–46.6)
Hemoglobin: 13.8 g/dL (ref 11.1–15.9)
MCH: 31.9 pg (ref 26.6–33.0)
MCHC: 34.9 g/dL (ref 31.5–35.7)
MCV: 91 fL (ref 79–97)
Platelets: 295 10*3/uL (ref 150–450)
RBC: 4.33 x10E6/uL (ref 3.77–5.28)
RDW: 13.9 % (ref 11.7–15.4)
WBC: 10.3 10*3/uL (ref 3.4–10.8)

## 2018-12-09 MED ORDER — PREDNISONE 50 MG PO TABS: ORAL_TABLET | ORAL | 0 refills | Status: DC

## 2018-12-09 MED ORDER — METOPROLOL TARTRATE 100 MG PO TABS: 100.0000 mg | ORAL_TABLET | Freq: Once | ORAL | 0 refills | Status: DC

## 2018-12-09 NOTE — Patient Instructions (Signed)
Your cardiac CT will be scheduled at one of the below locations:   [facility] [address] [place], [place] [address] (336) [place] [address] [place], [place] [address] [phone]  If scheduled at [facility], please arrive at the [facility] main entrance of [facility] 30-45 minutes prior to test start time. Proceed to the [facility] Radiology Department (first floor) to check-in and test prep.  If scheduled at [facility], please arrive 15 mins early for check-in and test prep.  Please follow these instructions carefully (unless otherwise directed):  Lab work: Your physician recommends that you HAVE lab work TODAY: COMPLETE BLOOD COUNT; BASIC METABOLIC PANEL  If you have labs (blood work) drawn today and your tests are completely normal, you will receive your results only by: Marland Kitchen MyChart Message (if you have MyChart) OR . A paper copy in the mail If you have any lab test that is abnormal or we need to change your treatment, we will call you to review the results.    On the Night Before the Test: . Be sure to Drink plenty of water. . Do not consume any caffeinated/decaffeinated beverages or chocolate 12 hours prior to your test. . Do not take any antihistamines 12 hours prior to your test. . If the patient has contrast allergy: ? Patient will need a prescription for Prednisone and very clear instructions (as follows): 1. Prednisone 50 mg - take 13 hours prior to test 2. Take another Prednisone 50 mg 7 hours prior to test 3. Take another Prednisone 50 mg 1 hour prior to test 4. Take Benadryl 50 mg 1 hour prior to test . Patient must complete all four doses of above prophylactic medications. . Patient will need a ride after test due to Benadryl.  On the Day of the Test: . Drink plenty of water. Do not drink any water within one hour of the  test. . Do not eat any food 4 hours prior to the test. . You may take your regular medications prior to the test.  . Take metoprolol (Lopressor) 100 MG two hours prior to test. . HOLD Hydrochlorothiazide morning of the test. . FEMALES- please wear underwire-free bra if available       After the Test: . Drink plenty of water. . After receiving IV contrast, you may experience a mild flushed feeling. This is normal. . On occasion, you may experience a mild rash up to 24 hours after the test. This is not dangerous. If this occurs, you can take Benadryl 25 mg and increase your fluid intake. . If you experience trouble breathing, this can be serious. If it is severe call 911 IMMEDIATELY. If it is mild, please call our office. . If you take any of these medications: Glipizide/Metformin, Avandament, Glucavance, please do not take 48 hours after completing test unless otherwise instructed.    Please contact the cardiac imaging nurse navigator should you have any questions/concerns Marchia Bond, RN Navigator Cardiac Imaging [facility] [phone] Office    ____________________________________________________________________   Follow-Up: At [facility], you and your health needs are our priority.  As part of our continuing mission to provide you with exceptional heart care, we have created designated Provider Care Teams.  These Care Teams include your primary Cardiologist (physician) and Advanced Practice Providers (APPs -  Physician Assistants and Nurse Practitioners) who all work together to provide  you with the care you need, when you need it. You will need a follow up appointment in 12 months with Dr. Quay Burow.  Please call our office 2 months in advance to schedule this appointment.

## 2018-12-09 NOTE — Assessment & Plan Note (Signed)
History of essential hypertension with blood pressure measured at 110/64.  She is on hydrochlorothiazide.

## 2018-12-09 NOTE — Assessment & Plan Note (Signed)
Ms. Goans has chest pain fairly frequently.  It is somewhat atypical with episodes of sharp pain the last for seconds at a time but also episodes of chest pressure.  She had a negative Myoview stress test performed 08/19/2017.  Because of her strong family history as well as her history of tobacco abuse in addition to the recent chest CT performed 11/26/2018 that showed calcified plaque in her left main LAD and circumflex coronary arteries and get a get a coronary CTA to further evaluate.

## 2018-12-09 NOTE — Progress Notes (Signed)
12/09/2018 Joan [name]   August 18, 1960  [id number]  Primary Physician [name], MD Primary Cardiologist: Lorretta Harp MD [name], [place]  HPI:  Joan [name] is a 58 y.o.  moderately overweight widowed Caucasian female mother of 42, grandmother of 5 grandchildren referred by Dr. Sharlet Salina for cardiovascular evaluation because of chest pressure.  He recently relocated from the [place] area down to [place] to be closer to family.  She lives with her brother [name].  She is been disabled since 2000 because of bipolar disorder.  I last saw her in the office 11/05/2017. Her cardiac risk factors include close to 100 pack years of tobacco abuse currently smoking 1 pack/week as well as treated hypertension and hyperlipidemia.  She has a strong family history of heart disease with mother who had stents as well as all 5 sisters.  She is never had a heart attack or stroke.  She is chronically short of breath most likely because of COPD with ongoing tobacco abuse and has complained of chest pressure over the last several months.  She was also told that she had a 50% right carotid artery stenosis by remote ultrasound.  Since I saw her a year ago she continues to do well.  She did stop smoking a year ago but then restarted during COVID-19 and has since stopped 3 weeks ago.  She continues to get some atypical sharp chest pain over her left breast which last for seconds at a time.  These do not sound cardiac although she also has episodes of chest pressure and upper abdominal pain.  She did have a recent chest CT performed 11/26/2018 for "cancer screening" that showed calcification of her left main, LAD and left circumflex.    Current Meds  Medication Sig   acyclovir (ZOVIRAX) 400 MG tablet Take 1 tablet (400 mg total) by mouth 2 (two) times daily. (Patient taking differently: Take 400 mg by mouth 2 (two) times daily as needed (cold sores). )   albuterol (PROVENTIL) (2.5 MG/3ML) 0.083%  nebulizer solution Take 3 mLs (2.5 mg total) by nebulization 2 (two) times daily. And every 6 hours as needed.  DX: J44.9   atenolol (TENORMIN) 25 MG tablet Take 1 tablet (25 mg total) by mouth daily.   atorvastatin (LIPITOR) 20 MG tablet Take 1 tablet (20 mg total) by mouth daily.   baclofen (LIORESAL) 10 MG tablet Take 10 mg by mouth at bedtime.   budesonide-formoterol (SYMBICORT) 160-4.5 MCG/ACT inhaler Inhale 2 puffs into the lungs 2 (two) times daily.   buPROPion (WELLBUTRIN XL) 150 MG 24 hr tablet Take 1 tablet (150 mg total) by mouth 2 (two) times daily. (Patient taking differently: Take 300 mg by mouth daily. )   cyclobenzaprine (FLEXERIL) 10 MG tablet Take 1 tablet (10 mg total) by mouth at bedtime.   diphenhydrAMINE (BENADRYL) 25 MG tablet Take 25 mg by mouth 2 (two) times daily.    docusate sodium (COLACE) 100 MG capsule Take 100 mg by mouth daily.   gabapentin (NEURONTIN) 300 MG capsule TAKE 1 CAPSULE BY MOUTH  TWICE DAILY (Patient taking differently: Take 600 mg by mouth at bedtime. Takes 661m QHS)   hydrochlorothiazide (HYDRODIURIL) 25 MG tablet Take 1 tablet (25 mg total) by mouth daily.   Ipratropium-Albuterol (COMBIVENT RESPIMAT) 20-100 MCG/ACT AERS respimat Inhale 1 puff into the lungs every 6 (six) hours as needed for wheezing. (Patient taking differently: Inhale 1 puff into the lungs every 6 (six) hours. )  levothyroxine (SYNTHROID, LEVOTHROID) 25 MCG tablet Take 1 tablet (25 mcg total) by mouth daily before breakfast.   Liniments (SALONPAS ARTHRITIS PAIN RELIEF EX) Apply 1 patch topically daily as needed (pain).   montelukast (SINGULAIR) 10 MG tablet Take 1 tablet (10 mg total) by mouth at bedtime.   Multiple Vitamins-Minerals (HAIR/SKIN/NAILS/BIOTIN) TABS Take 1 tablet by mouth daily.   Multiple Vitamins-Minerals (WOMENS 50+ MULTI VITAMIN/MIN PO) Take 1 tablet by mouth daily.   naproxen (NAPROSYN) 500 MG tablet Take 1 tablet (500 mg total) by mouth 2 (two)  times daily with a meal. (Patient taking differently: Take 500 mg by mouth 2 (two) times daily as needed (pain). )   nystatin-triamcinolone ointment (MYCOLOG) Apply 1 application topically 2 (two) times daily as needed (applied to skin folds/groins/under breast skin irritation rash.).   OXYGEN Inhale 2 L into the lungs at bedtime. May use 2.5 - 3 L during the day with strenuous activity   pantoprazole (PROTONIX) 40 MG tablet Take 1 tablet (40 mg total) by mouth daily.   roflumilast (DALIRESP) 500 MCG TABS tablet Take 1 tablet (500 mcg total) by mouth daily.   Simethicone (GAS RELIEF 80 PO) Take 80 mg by mouth 2 (two) times daily.   traZODone (DESYREL) 100 MG tablet Take 1 tablet (100 mg total) by mouth at bedtime.   varenicline (CHANTIX STARTING MONTH PAK) 0.5 MG X 11 & 1 MG X 42 tablet Take one 0.5 mg tablet by mouth once daily for 3 days, then increase to one 0.5 mg tablet twice daily for 4 days, then increase to one 1 mg tablet twice daily.   venlafaxine XR (EFFEXOR-XR) 150 MG 24 hr capsule TAKE 1 CAPSULE DAILY WITH  BREAKFAST (Patient taking differently: Take 150 mg by mouth daily with breakfast. )   [DISCONTINUED] Na Sulfate-K Sulfate-Mg Sulf 17.5-3.13-1.6 GM/177ML SOLN Take 1 kit by mouth as directed.     Allergies  Allergen Reactions   Keflex [Cephalexin] Other (See Comments)    unspecified   Prednisone Other (See Comments)    "counter reacts"   Shellfish Allergy Other (See Comments)    Stomach cramps   Tetracyclines & Related Other (See Comments)    unspecified   Dilaudid [Hydromorphone Hcl] Other (See Comments)    "Lethargy"   Incruse Ellipta [Umeclidinium Bromide] Nausea Only   Tuberculin Tests Other (See Comments)    Social History   Socioeconomic History   Marital status: Widowed    Spouse name: Not on file   Number of children: 3   Years of education: Not on file   Highest education level: Not on file  Occupational History   Not on file  Social  Needs   Financial resource strain: Somewhat hard   Food insecurity    Worry: Never true    Inability: Never true   Transportation needs    Medical: No    Non-medical: No  Tobacco Use   Smoking status: Current Every Day Smoker    Packs/day: 1.00    Years: 44.00    Pack years: 44.00    Types: Cigarettes   Smokeless tobacco: Never Used   Tobacco comment: Smoker 1-1.5 PPD every day, 11-20-2018 "i got myself down to half a pack", i am gonna be going on chantix on the 28th"  Substance and Sexual Activity   Alcohol use: Yes   Drug use: Yes    Types: "Crack" cocaine    Comment: 04/04/2017 "nothing since 01/12/2012"   Sexual activity: Never  Lifestyle  Physical activity    Days per week: 0 days    Minutes per session: 0 min   Stress: To some extent  Relationships   Social connections    Talks on phone: Three times a week    Gets together: Once a week    Attends religious service: Never    Active member of club or organization: No    Attends meetings of clubs or organizations: Never    Relationship status: Widowed   Intimate partner violence    Fear of current or ex partner: No    Emotionally abused: No    Physically abused: No    Forced sexual activity: No  Other Topics Concern   Not on file  Social History Narrative   Not on file     Review of Systems: General: negative for chills, fever, night sweats or weight changes.  Cardiovascular: negative for chest pain, dyspnea on exertion, edema, orthopnea, palpitations, paroxysmal nocturnal dyspnea or shortness of breath Dermatological: negative for rash Respiratory: negative for cough or wheezing Urologic: negative for hematuria Abdominal: negative for nausea, vomiting, diarrhea, bright red blood per rectum, melena, or hematemesis Neurologic: negative for visual changes, syncope, or dizziness All other systems reviewed and are otherwise negative except as noted above.    Blood pressure 110/64, pulse 85,  temperature (!) 97.2 F (36.2 C), height _0  (1.727 m), weight 197 lb 12.8 oz (89.7 kg), SpO2 93 %.  General appearance: alert and no distress Neck: no adenopathy, no carotid bruit, no JVD, supple, symmetrical, trachea midline and thyroid not enlarged, symmetric, no tenderness/mass/nodules Lungs: clear to auscultation bilaterally Heart: regular rate and rhythm, S1, S2 normal, no murmur, click, rub or gallop Extremities: extremities normal, atraumatic, no cyanosis or edema Pulses: 2+ and symmetric Skin: Skin color, texture, turgor normal. No rashes or lesions Neurologic: Alert and oriented X 3, normal strength and tone. Normal symmetric reflexes. Normal coordination and gait  EKG sinus rhythm 84 with nonspecific ST and T wave changes.  I personally reviewed this EKG.  ASSESSMENT AND PLAN:   Essential hypertension History of essential hypertension with blood pressure measured at 110/64.  She is on hydrochlorothiazide.  Dyslipidemia History of dyslipidemia on statin therapy with lipid profile performed 08/19/2017 revealing total cholesterol 134, LDL 62 and HDL 43.  Tobacco abuse History of tobacco abuse having quit when I saw her a year ago and then restarted back again.  She says she is quit for the last 3 weeks.  Chest pain of uncertain etiology Ms. Ulrich has chest pain fairly frequently.  It is somewhat atypical with episodes of sharp pain the last for seconds at a time but also episodes of chest pressure.  She had a negative Myoview stress test performed 08/19/2017.  Because of her strong family history as well as her history of tobacco abuse in addition to the recent chest CT performed 11/26/2018 that showed calcified plaque in her left main LAD and circumflex coronary arteries and get a get a coronary CTA to further evaluate.      Lorretta Harp MD FACP,FACC,FAHA, [facility] 12/09/2018 11:00 AM

## 2018-12-09 NOTE — Assessment & Plan Note (Signed)
History of tobacco abuse having quit when I saw her a year ago and then restarted back again.  She says she is quit for the last 3 weeks.

## 2018-12-09 NOTE — Assessment & Plan Note (Signed)
History of dyslipidemia on statin therapy with lipid profile performed 08/19/2017 revealing total cholesterol 134, LDL 62 and HDL 43.

## 2018-12-10 ENCOUNTER — Encounter: Payer: Self-pay | Admitting: *Deleted

## 2018-12-10 ENCOUNTER — Other Ambulatory Visit: Payer: Self-pay | Admitting: *Deleted

## 2018-12-10 ENCOUNTER — Telehealth: Payer: Self-pay | Admitting: Pulmonary Disease

## 2018-12-10 ENCOUNTER — Telehealth: Payer: Self-pay | Admitting: Acute Care

## 2018-12-10 DIAGNOSIS — F17210 Nicotine dependence, cigarettes, uncomplicated: Secondary | ICD-10-CM

## 2018-12-10 DIAGNOSIS — Z122 Encounter for screening for malignant neoplasm of respiratory organs: Secondary | ICD-10-CM

## 2018-12-10 NOTE — Telephone Encounter (Signed)
Pt informed of CT results per [name] Groce, NP.  PT verbalized understanding.  Copy sent to PCP.  Order placed for 1 yr f/u CT.  

## 2018-12-11 ENCOUNTER — Telehealth: Payer: Self-pay | Admitting: Pharmacist

## 2018-12-11 DIAGNOSIS — Z720 Tobacco use: Secondary | ICD-10-CM

## 2018-12-11 MED ORDER — NICOTINE 14 MG/24HR TD PT24: 14.0000 mg | MEDICATED_PATCH | Freq: Every day | TRANSDERMAL | 0 refills | Status: DC

## 2018-12-11 NOTE — Telephone Encounter (Signed)
Called pt on 12/11/18 at 11:30 AM to discuss smoking cessation with patient.   Patient states she start chantix 11/23/18 and quit smoking on 11/30/18. She currently is taking chantix 1 mg BID, Since has decreased from 1 ppd or more to 1/2 cigarette every few days when she gets an urge to smoke. She gets an urge to smoke when she is stressed, specifically if she hears bad news, is in a fight with a family member, or if she is bored. She states her urges are worse in the afternoon and at night. She has found success with using a lollipop or praying when she gets an urge. Of note, pt is additionally followed by [place] [facility] weekly for smoking cessation.   She states she has not used NRT patches before. She states she cannot use gum or lozenge since when she used them in the past they caused her throat to burn and were painful.   Plan for patient to continue chantix 1 mg twice daily, initiate NRT patch 14 mg one application daily, and to cuddle with her brother's cat Elyse Hsu) or dog Berle Mull) when she gets an urge to smoke (in addition to praying and/or using a lollipop).  Sent NRT patch rx into [facility]. Pharmacy staff member stated it was not covered under insurance. Called pt to inform her and let her know [facility] sells 14-day supply of OTC NRT patch 14 mg for~$25. Pt states she is unable to afford that price. Discussed with pt f/u with [place] [facility] for financial assistance with NRT patch. Pt verbalized understanding.   Will f/u with pt in 1 week on 12/18/18.

## 2018-12-11 NOTE — Telephone Encounter (Signed)
See encounter from today. Will close this encounter.

## 2018-12-15 ENCOUNTER — Inpatient Hospital Stay: Admission: RE | Admit: 2018-12-15 | Payer: Medicare Other | Source: Ambulatory Visit

## 2018-12-16 ENCOUNTER — Telehealth: Payer: Self-pay | Admitting: Acute Care

## 2018-12-16 NOTE — Telephone Encounter (Signed)
Spoke with patient. She stated that she was returning a call to [facility] with pharmacy. I advised her that I would let Stanton Kidney know she called back. She verbalized understanding.   Will route to [facility].

## 2018-12-17 ENCOUNTER — Ambulatory Visit (INDEPENDENT_AMBULATORY_CARE_PROVIDER_SITE_OTHER): Payer: Self-pay | Admitting: Licensed Clinical Social Worker

## 2018-12-17 ENCOUNTER — Ambulatory Visit (INDEPENDENT_AMBULATORY_CARE_PROVIDER_SITE_OTHER): Payer: Medicare Other | Admitting: Internal Medicine

## 2018-12-17 ENCOUNTER — Other Ambulatory Visit (INDEPENDENT_AMBULATORY_CARE_PROVIDER_SITE_OTHER): Payer: Medicare Other

## 2018-12-17 ENCOUNTER — Other Ambulatory Visit: Payer: Self-pay

## 2018-12-17 ENCOUNTER — Encounter: Payer: Self-pay | Admitting: Internal Medicine

## 2018-12-17 ENCOUNTER — Ambulatory Visit (INDEPENDENT_AMBULATORY_CARE_PROVIDER_SITE_OTHER)
Admission: RE | Admit: 2018-12-17 | Discharge: 2018-12-17 | Disposition: A | Discharge status: Home / Self Care | Payer: Medicare Other | Source: Ambulatory Visit | Attending: Internal Medicine | Admitting: Internal Medicine

## 2018-12-17 VITALS — BP 110/80 | HR 70 | Temp 98.3°F | Ht 68.0 in | Wt 199.0 lb

## 2018-12-17 DIAGNOSIS — E099 Drug or chemical induced diabetes mellitus without complications: Secondary | ICD-10-CM

## 2018-12-17 DIAGNOSIS — T380X5D Adverse effect of glucocorticoids and synthetic analogues, subsequent encounter: Secondary | ICD-10-CM | POA: Diagnosis not present

## 2018-12-17 DIAGNOSIS — M542 Cervicalgia: Secondary | ICD-10-CM

## 2018-12-17 DIAGNOSIS — Z23 Encounter for immunization: Secondary | ICD-10-CM | POA: Diagnosis not present

## 2018-12-17 DIAGNOSIS — Z124 Encounter for screening for malignant neoplasm of cervix: Secondary | ICD-10-CM | POA: Diagnosis not present

## 2018-12-17 DIAGNOSIS — Z720 Tobacco use: Secondary | ICD-10-CM

## 2018-12-17 DIAGNOSIS — E785 Hyperlipidemia, unspecified: Secondary | ICD-10-CM

## 2018-12-17 DIAGNOSIS — F4312 Post-traumatic stress disorder, chronic: Secondary | ICD-10-CM

## 2018-12-17 LAB — LIPID PANEL
Cholesterol: 125 mg/dL (ref 0–200)
HDL: 37.8 mg/dL — ABNORMAL LOW (ref >39.00)
NonHDL: 87.45
Total CHOL/HDL Ratio: 3
Triglycerides: 261 mg/dL — ABNORMAL HIGH (ref 0.0–149.0)
VLDL: 52.2 mg/dL — ABNORMAL HIGH (ref 0.0–40.0)

## 2018-12-17 LAB — LDL CHOLESTEROL, DIRECT: Direct LDL: 56 mg/dL

## 2018-12-17 LAB — MICROALBUMIN / CREATININE URINE RATIO
Creatinine,U: 85.4 mg/dL
Microalb Creat Ratio: 0.8 mg/g (ref 0.0–30.0)
Microalb, Ur: <0.7 mg/dL (ref 0.0–1.9)

## 2018-12-17 LAB — HEMOGLOBIN A1C: Hgb A1c MFr Bld: 6.7 % — ABNORMAL HIGH (ref 4.6–6.5)

## 2018-12-17 NOTE — Progress Notes (Signed)
   Subjective:   Patient ID: Joan [name], female    DOB: 1960-04-12, 58 y.o.   MRN: [id number]  HPI The patient is a 58 YO female coming in for concerns about her diabetes (steroid induced, last HgA1c 7.0 off meds, needs follow up, denies numbness in the feet or hands) and neck pain (constant and started years ago, she was supposed to get surgery on this up [place] but her lower back took priority and then the neck stopped hurting as much, in the last 3-6 months it is hurting a lot, denies numbness or weakness in the arms or hands, taking tylenol for it with some relief and topicals), and cholesterol (needs labs, taking lipitor 20 mg daily, denies side effects, denies stroke or heart attack symptoms, seeing cardiology yearly now).   Review of Systems  Constitutional: Positive for activity change and appetite change.  HENT: Negative.   Eyes: Negative.   Respiratory: Positive for cough and shortness of breath. Negative for chest tightness.   Cardiovascular: Negative for chest pain, palpitations and leg swelling.  Gastrointestinal: Negative for abdominal distention, abdominal pain, constipation, diarrhea, nausea and vomiting.  Musculoskeletal: Positive for arthralgias, back pain, myalgias, neck pain and neck stiffness.  Skin: Negative.   Neurological: Negative.   Psychiatric/Behavioral: Positive for decreased concentration and dysphoric mood.    Objective:  Physical Exam Constitutional:      Appearance: She is well-developed. She is obese.  HENT:     Head: Normocephalic and atraumatic.  Neck:     Musculoskeletal: Normal range of motion.  Cardiovascular:     Rate and Rhythm: Normal rate and regular rhythm.  Pulmonary:     Effort: Pulmonary effort is normal. No respiratory distress.     Breath sounds: No wheezing or rales.     Comments: Stable lung exam Abdominal:     General: Bowel sounds are normal. There is no distension.     Palpations: Abdomen is soft.     Tenderness: There is no  abdominal tenderness. There is no rebound.  Musculoskeletal:        General: Tenderness present.  Skin:    General: Skin is warm and dry.  Neurological:     Mental Status: She is alert and oriented to person, place, and time.     Coordination: Coordination abnormal.     Comments: Wheelchair in office, uses walker at home     Vitals:   12/17/18 1326  BP: 110/80  Pulse: 70  Temp: 98.3 F (36.8 C)  TempSrc: Oral  SpO2: 96%  Weight: 199 lb (90.3 kg)  Height: 5\' 8"  (1.727 m)    Assessment & Plan:  Tdap given at visit

## 2018-12-17 NOTE — Patient Instructions (Addendum)
We will do the x-ray of the neck today.  We will get you in for a pap smear. Call [phone] to schedule the mammogram.

## 2018-12-18 ENCOUNTER — Other Ambulatory Visit: Payer: Self-pay | Admitting: *Deleted

## 2018-12-18 ENCOUNTER — Other Ambulatory Visit: Payer: Self-pay | Admitting: Internal Medicine

## 2018-12-18 DIAGNOSIS — M47812 Spondylosis without myelopathy or radiculopathy, cervical region: Secondary | ICD-10-CM | POA: Insufficient documentation

## 2018-12-18 DIAGNOSIS — M542 Cervicalgia: Secondary | ICD-10-CM

## 2018-12-18 NOTE — Assessment & Plan Note (Signed)
Needs HgA1c, lipid panel, microalbumin to creatinine ratio. Taking lipitor and not currently on prednisone but has had frequent breathing problems requiring extended courses of prednisone.

## 2018-12-18 NOTE — Assessment & Plan Note (Signed)
She has currently quit for about 3 weeks and encouraged to make this lifelong. She is having surgery on left hip and this is why she has quit to help with lung function around time of surgery.

## 2018-12-18 NOTE — Assessment & Plan Note (Signed)
Checking x-ray cervical spine and may benefit from PT which we talked about. She may also want to see neurosurgery for consideration of injections prior to a surgical option.

## 2018-12-18 NOTE — Assessment & Plan Note (Signed)
Checking lipid panel and adjust lipitor 20 mg daily as needed. 

## 2018-12-18 NOTE — Patient Outreach (Addendum)
[facility]) Care Management  [place]  12/18/2018   Joan [name] 1960-09-18 [id number]  RN Health Coach telephone call to patient.  [employer] compliance verified. Per patient she is doing fair. Per patient she is using her oxygen intermittently around the house and uses it at hs with her CPAP.She is using her inhalers and nebulizer as per ordered. Patient is on a keto diet to help loose weight. She stopped smoking on November 30 2018 and has smoked a total of 3 cigarettes since then. Patient lives with brother and sister and drives self. Patient she had an EBT card and needs to get it renewed. She has gotten flu shot. She is pending surgery on 01/26/2019. Patient understands zones and action plan. Patient has agreed to follow up outreach calls.   Encounter Medications:  Outpatient Encounter Medications as of 12/18/2018  Medication Sig  . acyclovir (ZOVIRAX) 400 MG tablet Take 1 tablet (400 mg total) by mouth 2 (two) times daily. (Patient taking differently: Take 400 mg by mouth 2 (two) times daily as needed (cold sores). )  . albuterol (PROVENTIL) (2.5 MG/3ML) 0.083% nebulizer solution Take 3 mLs (2.5 mg total) by nebulization 2 (two) times daily. And every 6 hours as needed.  DX: J44.9  . atenolol (TENORMIN) 25 MG tablet Take 1 tablet (25 mg total) by mouth daily.  Marland Kitchen atorvastatin (LIPITOR) 20 MG tablet Take 1 tablet (20 mg total) by mouth daily.  . baclofen (LIORESAL) 10 MG tablet Take 10 mg by mouth at bedtime.  . budesonide-formoterol (SYMBICORT) 160-4.5 MCG/ACT inhaler Inhale 2 puffs into the lungs 2 (two) times daily.  Marland Kitchen buPROPion (WELLBUTRIN XL) 150 MG 24 hr tablet Take 1 tablet (150 mg total) by mouth 2 (two) times daily. (Patient taking differently: Take 300 mg by mouth daily. )  . cyclobenzaprine (FLEXERIL) 10 MG tablet Take 1 tablet (10 mg total) by mouth at bedtime.  . diphenhydrAMINE (BENADRYL) 25 MG tablet Take 25 mg by mouth 2 (two) times daily.   Marland Kitchen docusate  sodium (COLACE) 100 MG capsule Take 100 mg by mouth daily.  Marland Kitchen gabapentin (NEURONTIN) 300 MG capsule TAKE 1 CAPSULE BY MOUTH  TWICE DAILY (Patient taking differently: Take 600 mg by mouth at bedtime. Takes 600mg  QHS)  . hydrochlorothiazide (HYDRODIURIL) 25 MG tablet Take 1 tablet (25 mg total) by mouth daily.  . Ipratropium-Albuterol (COMBIVENT RESPIMAT) 20-100 MCG/ACT AERS respimat Inhale 1 puff into the lungs every 6 (six) hours as needed for wheezing. (Patient taking differently: Inhale 1 puff into the lungs every 6 (six) hours. )  . levothyroxine (SYNTHROID, LEVOTHROID) 25 MCG tablet Take 1 tablet (25 mcg total) by mouth daily before breakfast.  . Liniments (SALONPAS ARTHRITIS PAIN RELIEF EX) Apply 1 patch topically daily as needed (pain).  . metoprolol tartrate (LOPRESSOR) 100 MG tablet Take 1 tablet (100 mg total) by mouth once for 1 dose. TAKE 1 TABLET TWO HOURS PRIOR TO YOUR CORONARY CTA  . montelukast (SINGULAIR) 10 MG tablet Take 1 tablet (10 mg total) by mouth at bedtime.  . Multiple Vitamins-Minerals (WOMENS 50+ MULTI VITAMIN/MIN PO) Take 1 tablet by mouth daily.  . naproxen (NAPROSYN) 500 MG tablet Take 1 tablet (500 mg total) by mouth 2 (two) times daily with a meal. (Patient taking differently: Take 500 mg by mouth 2 (two) times daily as needed (pain). )  . nicotine (NICODERM CQ) 14 mg/24hr patch Place 1 patch (14 mg total) onto the skin daily.  Marland Kitchen nystatin-triamcinolone ointment (  MYCOLOG) Apply 1 application topically 2 (two) times daily as needed (applied to skin folds/groins/under breast skin irritation rash.).  Marland Kitchen OXYGEN Inhale 2 L into the lungs at bedtime. May use 2.5 - 3 L during the day with strenuous activity  . pantoprazole (PROTONIX) 40 MG tablet Take 1 tablet (40 mg total) by mouth daily.  . predniSONE (DELTASONE) 50 MG tablet Prednisone 50 mg - take 13 hours prior to test Take another Prednisone 50 mg 7 hours prior to test Take another Prednisone 50 mg 1 hour prior to test Take  Benadryl 50 mg 1 hour prior to test  . roflumilast (DALIRESP) 500 MCG TABS tablet Take 1 tablet (500 mcg total) by mouth daily.  . Simethicone (GAS RELIEF 80 PO) Take 80 mg by mouth 2 (two) times daily.  . traZODone (DESYREL) 100 MG tablet Take 1 tablet (100 mg total) by mouth at bedtime.  . varenicline (CHANTIX STARTING MONTH PAK) 0.5 MG X 11 & 1 MG X 42 tablet Take one 0.5 mg tablet by mouth once daily for 3 days, then increase to one 0.5 mg tablet twice daily for 4 days, then increase to one 1 mg tablet twice daily.  Marland Kitchen venlafaxine XR (EFFEXOR-XR) 150 MG 24 hr capsule TAKE 1 CAPSULE DAILY WITH  BREAKFAST (Patient taking differently: Take 150 mg by mouth daily with breakfast. )   No facility-administered encounter medications on file as of 12/18/2018.     Functional Status:  In your present state of health, do you have any difficulty performing the following activities: 12/18/2018 05/22/2018  Hearing? Y N  Vision? N N  Difficulty concentrating or making decisions? Y N  Walking or climbing stairs? Y Y  Comment patient uses a cane and is on home O2 -  Dressing or bathing? N N  Doing errands, shopping? Y N  Comment Patient uses a cane or holds on to cart when trying to do shopping -  Preparing Food and eating ? N -  Using the Toilet? N -  In the past six months, have you accidently leaked urine? N -  Do you have problems with loss of bowel control? N -  Managing your Medications? Y -  Managing your Finances? Y -  Housekeeping or managing your Housekeeping? Y -  Comment family helps -  Some recent data might be hidden    Fall/Depression Screening: Fall Risk  12/18/2018 04/06/2018  Falls in the past year? 0 0  Number falls in past yr: 0 -  Injury with Fall? 0 -  Risk for fall due to : Impaired balance/gait;Impaired mobility Impaired balance/gait;Impaired mobility  Follow up Falls evaluation completed Falls prevention discussed   PHQ 2/9 Scores 12/18/2018 11/18/2018 04/06/2018 11/28/2016   PHQ - 2 Score 3 1 2 1   PHQ- 9 Score - - 6 -   THN CM Care Plan Problem One     Most Recent Value  Care Plan Problem One  Knowledge Deficit in Self Management of COPD  Role Documenting the Problem One  Liberal for Problem One  Active  THN Long Term Goal   Patient will not have a COPD exacerbation within the next 90 days  THN Long Term Goal Start Date  12/18/18  Interventions for Problem One Long Term Goal  RN discused COPD zones and action plan. RN went over what the patient does when having an episode. RN sent COPD packet. RN will follow up with further discussion  THN CM Short  Term Goal #1   Patient will have a better understanding of COPD and physical activity within the nexty 30 days  THN CM Short Term Goal #1 Start Date  12/18/18  Interventions for Short Term Goal #1  RN discussed the patient activity and limitations. RN sent educational material on COPD and physical activity.  RN will follow up with further discussion  THN CM Short Term Goal #2   Patient will have a beeter underrstanding of Eating plan for COPD within the next 30 days  THN CM Short Term Goal #2 Start Date  12/18/18  Interventions for Short Term Goal #2  RN discussed healthy eating. RN sent patient educational material on COPD eating plan/ RN will follow up with further discussion      Assessment:  Patient is using inhalers Patient is using oxygen intermittently Patient is on a keto diet Has received flu shot Patient uses CPAP at hs  Plan: Referral to social worker RN discussed COPD and exercise RN sent educational material on COPD exacerbation RN sent education on eating plan and COPD RN sent educational material on COPD and activity RN sent COPD packet RN discussed action plan and zones for COPD RN will follow up outreach within the month of January RN sent barrier letter and assessment to PCP   [place] Management [phone]

## 2018-12-21 ENCOUNTER — Other Ambulatory Visit: Payer: Self-pay

## 2018-12-21 ENCOUNTER — Telehealth: Payer: Self-pay | Admitting: Pharmacist

## 2018-12-21 ENCOUNTER — Ambulatory Visit (INDEPENDENT_AMBULATORY_CARE_PROVIDER_SITE_OTHER): Payer: Medicare Other | Admitting: Licensed Clinical Social Worker

## 2018-12-21 DIAGNOSIS — F4312 Post-traumatic stress disorder, chronic: Secondary | ICD-10-CM | POA: Diagnosis not present

## 2018-12-21 NOTE — Patient Outreach (Signed)
[facility]) Care Management  12/21/2018  Joan [name] Apr 18, 1960 [id number]   Social work referral received from [employer], [employer].   "This patient is being referred because she needs some assistance finding the papers on line to fill out for her renewal of her EBT card." Successful outreach to patient today.   Assisted patient with accessing application on-line.  Closing social work case but did encourage her to call if additional needs arise.    Ronn Melena, BSW Social Worker [phone]

## 2018-12-21 NOTE — Telephone Encounter (Signed)
Called pt on 12/21/18 at 4:10PM.  Age when started using tobacco on a daily basis: 58 years old (would smoke 2-3 packs per day) Preferred cigarette brand: Marlboro lights then switched to Parliament  Tobacco use: 1/2 cigarette twice in the past 2 weeks  -Now throwing out the remaining half Triggers: stress, specifically if she hears bad news, is in a fight with a family member, or if she is bored.  Current smoking cessation agents: chantix 1 mg twice daily, NRT patch one application daily (prescription from [facility] sent on Friday) Prior smoking cessation agents: gum or lozenge(caused her throat to burn and were painful)  Non-pharmacologic methods: organizing room, cuddle with her brother's cat Elyse Hsu) or dog Berle Mull) when she gets an urge to smoke, praying, eating a lollipop, calls [facility]  Motivation to quit: Very motivated; "I am tired of not being able to walk" Rates IMPORTANCE of quitting tobacco on 1-10 scale of 9 Rates CONFIDENCE of quitting tobacco on 1-10 scale of 6 -Not confident because she smoked 2 times in the past 2 weeks Goal: none to 1/2 of a cigarette once in the next two weeks  Plan: chantix 1 mg twice daily, NRT patch one application daily (recommended pt start with the 14 mg patch daily for 6 weeks then 7 mg patch daily for 2 weeks).  Follow up: 01/01/19

## 2018-12-21 NOTE — Progress Notes (Signed)
Virtual Visit via Telephone Note  I connected with Joan [name] on 12/21/18 at  9:00 AM EDT by telephone and verified that I am speaking with the correct person using two identifiers.   I discussed the limitations, risks, security and privacy concerns of performing an evaluation and management service by telephone and the availability of in person appointments. I also discussed with the patient that there may be a patient responsible charge related to this service. The patient expressed understanding and agreed to proceed.   The patient was provided an opportunity to ask questions and all were answered. The patient agreed with the plan and demonstrated an understanding of the instructions.   The patient was advised to call back or seek an in-person evaluation if the symptoms worsen or if the condition fails to improve as anticipated.  I provided 45 minutes of non-face-to-face time during this encounter.  THERAPIST PROGRESS NOTE  Session Time: 9:10 to 9:55 PM  Participation Level: Active  Behavioral Response: CasualAlertEuthymic  Type of Therapy: Individual Therapy  Treatment Goals addressed:  paranoia, hallucinations, though blocking, trauma, coping Interventions: Solution Focused, Strength-based, Supportive and Other: coping  Summary: Joan [name] is a 58 y.o. female who presents with ok. Life is the same every day. Has two kids in [place] and one [place]. Describes daily life, sitting on chair, playing on phone and tablet, watch TV. Go out if I have to go out. Don't go out every day. No friends so no where to go out, if want to go out sit outside. Neighbors are friendly. Occassionally chat with them. Brother is here.  Reviewed more of history of why now in [place] (from [place]) [place] my stuff and moved to [name], didn't work out so well there. September 2017 left March 2018. Supposed to go back, left my things there.  Two sisters were there. When I got there I  found out that I wasn't wanted. Sister [name] (also sister [name]) they told her to come down. Gave  government subsidized apartment. Then  supposedly going to get apartment with brother, [name], he was in [place] but did not have all the information such as deposit for her to move then.  She has 10 brothers and sisters and patient is the youngest. Describes her relationship with them: "they are out to lunch and never came back". "I am the weird one because I see dead people." We all have are only freaky thing. My sister is in denial of her own life. Play innocent but she is the snake in the grass.-[name]. Five sisters ahead of me. Donnie-nurse always at work. Get along with everyone. I don't take anything, I take everything with a grain of salt. I have two other sisters [name] Patient and Kendrick Fries and they are awesome. I get bored sometimes but I am fine. Brother and sister are here and I go shopping with. Explored wanting to get to know more people and patient said "I'm good". Explored whether paranoia is a factor and says it is. I shy away. Even when I was in the program I didn't make many people friends at all. Other people seemed to have a lot of friends and wonders how they did it. I go to try and don't trust them. One friend Santiago Glad I really liked her as friend and we did things once in awhile. She is in [place] and I miss her. Had a friend and we did a lot together, had two kids and she invited  me over. She would end up out and I would be with the two kids. This happened for awhile and I don't know why I put up for it so long. After a couple of years it got old, wanted to be on own and enjoy myself, after my husband died.At [facility] and wanted own place.  I was really sick from a relapse, she came to visit but said couldn't watch her throwing up. Michela Pitcher would come back and never did so that "was the end of that friendship".I don't want to get to know more people. People use you to get what they can and  that is the end of it. Does want to work on paranoia. Reviewed session and patient shared that she was able to share her  experience strength and hope.   Therapist assessed patient current functioning per report. Reviewed more of client's history to work on developing therapeutic rapport as well as gathering information about support system, family relationships, sources for paranoia.  (And will continue to explore) reviewed current lifestyle to explore whether any connections with mood and reviewed any goals for patient to make positive changes in lifestyle.  Identified currently patient is content but at the same time we will  continue to explore impact of paranoia and whether improvement in the symptom will help in making positive changes to improve patient's life.  Also work on challenging negative perspectives patient has for accuracy to help and shifting perspective to help in lessening mistrust. Therapist  provided patient with emotional support and encouragement reinforced the importance of client staying focused on her own strengths and resources and resiliency. Processed feelings different episodes in her life story.    Suicidal/Homicidal: No  Plan: Return again in 2-3 weeks.2.Therapist work with patient on psychotic systems, coping, explore issues of distress for patient  Diagnosis: Axis I: PTSD   Axis II: No diagnosis    [name], LCSW 12/21/2018

## 2018-12-24 ENCOUNTER — Ambulatory Visit (INDEPENDENT_AMBULATORY_CARE_PROVIDER_SITE_OTHER): Payer: Medicare Other | Admitting: Gastroenterology

## 2018-12-24 ENCOUNTER — Encounter: Payer: Self-pay | Admitting: Gastroenterology

## 2018-12-24 ENCOUNTER — Other Ambulatory Visit: Payer: Self-pay

## 2018-12-24 VITALS — BP 136/70 | HR 86 | Temp 97.0°F | Ht 68.0 in | Wt 197.0 lb

## 2018-12-24 DIAGNOSIS — R142 Eructation: Secondary | ICD-10-CM | POA: Diagnosis not present

## 2018-12-24 DIAGNOSIS — R140 Abdominal distension (gaseous): Secondary | ICD-10-CM

## 2018-12-24 DIAGNOSIS — K5900 Constipation, unspecified: Secondary | ICD-10-CM

## 2018-12-24 MED ORDER — LINACLOTIDE 145 MCG PO CAPS: 145.0000 ug | ORAL_CAPSULE | Freq: Every day | ORAL | 3 refills | Status: DC

## 2018-12-24 NOTE — Progress Notes (Signed)
Referring Provider: [name], * Primary Care Physician:  [name], MD  Chief complaint:  Gas, eructation, and bloating  IMPRESSION:  Many years of frequent belching, bloating  and flatulence    - not improved with GasEx    -Duodenal biopsies normal 10/2018 Constipation requiring Colace Dysphonia evaluated by ENT and thought to be related to smoking Severe COPD on 2L oxygen in the daytime and 3L when active OSA on CPAP Recently quit smoking! Wheelchair use for deconditioning Normal colonoscopy 10/2018 No known family history of colon cancer or polyps   Recent GI symptoms are likely a combination of diet and aerophagia from her COPD, CPAP, and ongoing straw use.  No alarm features.  There may also be a component of functional dyspepsia, supra gastric belching, or small intestinal bacterial overgrowth.  Without diarrhea this is less likely to be a disaccharide deficiency.  Constipation may be exacerbating her symptoms although she denies a temporal association.   PLAN: Continue pantoprazole Obtain prior colonoscopy and EGD report from [place], [place] Reviewed diaphragmatic breathing with the patient Consider trial of FDGuard after EGD Trial of Linzess 145 mcg daily Recommended dietary changes as outlined in patient instructions I have asked her not to use a straw Consider 24 hour pH/impedence testing to evaluate for supragastric belching and air swallowing Consider an empiric trial for small intestinal bacterial overgrowth Colonoscopy 3-4 months, or earlier as needed   HPI: Joan [name] is a 58 y.o. female who returns in follow-up for gas and bloating.  She was previously followed by Dr. Loletha Carrow for frequent belching and flatulence.  She was last seen by Dr. Loletha Carrow 01/01/2018.  I saw her in virtual consultation 09/01/2018.  She had endoscopic evaluation performed 11/24/2018.  She returns in follow-up.  The interval history is obtained to the patient and  review of her electronic health record.  She has severe COPD, steroid-induced diabetes, obstructive sleep apnea on CPAP, bipolar disorder, hypertension, stage II chronic kidney disease, hypothyroidism, pulmonary nodule followed by the lung cancer screening program.  She has been hospitalized for COPD exacerbations in 12/2017 and 04/2018.  She uses oxygen @ 2L as needed during day and if she is active she uses 3 L. She has been using a wheelchair due to physical deconditioning. She quit smoking since our last appointment!  She is most bothered by a many year history of frequent, embarrassing belching and flatulence with associated bloating and distension that occurs while eating or immediately following. Improves but does not resolve a couple of hours after eating.  Intermittent malodorous. Her bowel habits were previously regular, but for the last couple of years she will have some feelings of lower abdominal pressure, need for a bowel movement, then just small pellets will pass. Stools are often liquid.  She denies rectal bleeding, melena, or hematochezia.  She is trying to diet. Good appetite. No identified specific food triggers. No early satiety. Rare nausea.  GasEx provided no significant relief.  Had an upper endoscopy and colonoscopy in [place], [place] many years ago for similar symptoms. She feels like she was in her 79s when those were performed.  Dr. Loletha Carrow felt her symptoms were combination of dietary, maldigestion, and aerophagia from her COPD and continued smoking. She feels like the symptoms are too severe to be related to her COPD and smoking.   She has been having more difficulty with constipation and is treating this with Colace. Associated straining. Sense of complete evacuation.  Does not feel that the bloating is related to the constipation. No other associated symptoms. No identified exacerbating or relieving features. Used Miralax daily but didn't feel that it helped.   She  continues to use Sweet-n-Low. Drinks a 12 pack of creamed soda every couple of weeks. Drinks coffee daily. Quit smoking. Does not chew gum.  She uses a drinking straw.   She had a colonoscopy and upper endoscopy 11/24/2018.  The colonoscopy was normal.  The upper endoscopy revealed gastritis.  Small bowel biopsies were normal.  Gastric biopsies showed chronic inactive gastritis.  There was no H. Pylori.  She has no new complaints or concerns at this time.    Prior abdominal in imaging: CT of the abdomen and pelvis with contrast 06/03/2017 obtained to evaluate right upper quadrant abdominal pain: Mesh repair of an umbilical hernia, small fluid collection in the extraperitoneal abdominal wall superficial to the mesh, no residual hernia, stable L5-S1 posterior instrumented fusion, right hip replacement  Labs from March 2020 show a hemoglobin of 12.4, MCV 91.9, RDW 15.4.  Endoscopic history: EGD and colonoscopy in [place] performed in her 65s Colonoscopy 11/24/2018: Normal EGD 11/24/2018: Chronic inactive H. pylori negative gastritis.  Small bowel biopsies were normal.  Past Medical History:  Diagnosis Date   Acute respiratory failure with hypoxia (HCC)    Alcoholism and drug addiction in family    Anxiety    Arthritis    "knees, hips, lower back" (09/25/2016)   Asthma    Bipolar disorder ([place])    Chronic bronchitis ([name])    "all the time" (09/25/2016)   Chronic leg pain    RLE   Chronic lower back pain    COPD (chronic obstructive pulmonary disease) ([place])    Depression    Emphysema of lung (HCC)    GERD (gastroesophageal reflux disease)    Headache    Hepatitis C    "treated back in 2001-2002"   High cholesterol    Hypertension    Hypothyroidism    Incarcerated ventral hernia 04/04/2017   On home oxygen therapy    "2L; at nighttime" (04/04/2017)   OSA on CPAP    Pneumonia    "all the time" (09/25/2016)   Positive PPD    with negative chest x ray     Tachycardia    patient reports with exertion ; denies any other conditions    Thyroid disease     Past Surgical History:  Procedure Laterality Date   BACK SURGERY     BIOPSY  11/24/2018   Procedure: BIOPSY;  Surgeon: Thornton Park, MD;  Location: [facility];  Service: Gastroenterology;;   CARDIAC CATHETERIZATION     COLONOSCOPY     COLONOSCOPY WITH PROPOFOL N/A 11/24/2018   Procedure: COLONOSCOPY WITH PROPOFOL;  Surgeon: Thornton Park, MD;  Location: [facility];  Service: Gastroenterology;  Laterality: N/A;   CYST EXCISION     removal of cyst in sinuses   DILATION AND CURETTAGE OF UTERUS     ESOPHAGOGASTRODUODENOSCOPY (EGD) WITH PROPOFOL N/A 11/24/2018   Procedure: ESOPHAGOGASTRODUODENOSCOPY (EGD) WITH PROPOFOL;  Surgeon: Thornton Park, MD;  Location: [facility];  Service: Gastroenterology;  Laterality: N/A;   FOOT SURGERY Bilateral    bone removed from 4th and 5 th toes and put in pin then took pin out   [place]  ~ 2014/2015   "removed mesh & infection"   [place] N/A 04/04/2017   Procedure: LAPAROSCOPIC INCISIONAL  HERNIA REPAIR WITH MESH;  Surgeon: Fanny Skates, MD;  Location: [facility];  Service: General;  Laterality: N/A;   LACERATION REPAIR Right    repair wrist artery from laceration from ice skate   LAPAROSCOPIC INCISIONAL / UMBILICAL / [place]  04/04/2017   LAPAROSCOPIC INCISIONAL HERNIA REPAIR WITH MESH   LIPOMA EXCISION Right    fattty lipoma in neck   NASAL SEPTUM SURGERY     POSTERIOR LUMBAR FUSION  2015/2016 X 2   "added rods and screws"   TOTAL HIP ARTHROPLASTY Right 02/05/2017   Procedure: RIGHT TOTAL HIP ARTHROPLASTY;  Surgeon: [name], MD;  Location: [facility];  Service: Orthopedics;  Laterality: Right;   TOTAL HIP ARTHROPLASTY Left 06/18/2017   Procedure: LEFT TOTAL HIP ARTHROPLASTY;  Surgeon: [name], MD;  Location: [facility];  Service: Orthopedics;   Laterality: Left;   TUBAL LIGATION     UMBILICAL HERNIA REPAIR  ~ 2013   w/mesh   UPPER GI ENDOSCOPY      Current Outpatient Medications  Medication Sig Dispense Refill   acyclovir (ZOVIRAX) 400 MG tablet Take 1 tablet (400 mg total) by mouth 2 (two) times daily. (Patient taking differently: Take 400 mg by mouth 2 (two) times daily as needed (cold sores). ) 180 tablet 3   albuterol (PROVENTIL) (2.5 MG/3ML) 0.083% nebulizer solution Take 3 mLs (2.5 mg total) by nebulization 2 (two) times daily. And every 6 hours as needed.  DX: J44.9 360 mL 3   atenolol (TENORMIN) 25 MG tablet Take 1 tablet (25 mg total) by mouth daily. 90 tablet 3   atorvastatin (LIPITOR) 20 MG tablet Take 1 tablet (20 mg total) by mouth daily. 90 tablet 3   baclofen (LIORESAL) 10 MG tablet Take 10 mg by mouth at bedtime.     budesonide-formoterol (SYMBICORT) 160-4.5 MCG/ACT inhaler Inhale 2 puffs into the lungs 2 (two) times daily. 2 Inhaler 0   buPROPion (WELLBUTRIN XL) 150 MG 24 hr tablet Take 1 tablet (150 mg total) by mouth 2 (two) times daily. (Patient taking differently: Take 300 mg by mouth daily. ) 180 tablet 3   cyclobenzaprine (FLEXERIL) 10 MG tablet Take 1 tablet (10 mg total) by mouth at bedtime. 90 tablet 1   diphenhydrAMINE (BENADRYL) 25 MG tablet Take 25 mg by mouth 2 (two) times daily.      docusate sodium (COLACE) 100 MG capsule Take 100 mg by mouth daily.     gabapentin (NEURONTIN) 300 MG capsule TAKE 1 CAPSULE BY MOUTH  TWICE DAILY (Patient taking differently: Take 600 mg by mouth at bedtime. Takes 600mg  QHS) 180 capsule 1   hydrochlorothiazide (HYDRODIURIL) 25 MG tablet Take 1 tablet (25 mg total) by mouth daily. 90 tablet 3   Ipratropium-Albuterol (COMBIVENT RESPIMAT) 20-100 MCG/ACT AERS respimat Inhale 1 puff into the lungs every 6 (six) hours as needed for wheezing. (Patient taking differently: Inhale 1 puff into the lungs every 6 (six) hours. ) 3 Inhaler 3   levothyroxine (SYNTHROID,  LEVOTHROID) 25 MCG tablet Take 1 tablet (25 mcg total) by mouth daily before breakfast. 90 tablet 3   Liniments (SALONPAS ARTHRITIS PAIN RELIEF EX) Apply 1 patch topically daily as needed (pain).     metoprolol tartrate (LOPRESSOR) 100 MG tablet Take 1 tablet (100 mg total) by mouth once for 1 dose. TAKE 1 TABLET TWO HOURS PRIOR TO YOUR CORONARY CTA 1 tablet 0   montelukast (SINGULAIR) 10 MG tablet Take 1 tablet (10 mg total) by mouth at bedtime.  90 tablet 3   Multiple Vitamins-Minerals (WOMENS 50+ MULTI VITAMIN/MIN PO) Take 1 tablet by mouth daily.     naproxen (NAPROSYN) 500 MG tablet TAKE 1 TABLET BY MOUTH  TWICE DAILY WITH A MEAL 180 tablet 3   nicotine (NICODERM CQ) 14 mg/24hr patch Place 1 patch (14 mg total) onto the skin daily. 28 patch 0   nystatin-triamcinolone ointment (MYCOLOG) Apply 1 application topically 2 (two) times daily as needed (applied to skin folds/groins/under breast skin irritation rash.). 100 g 3   OXYGEN Inhale 2 L into the lungs at bedtime. May use 2.5 - 3 L during the day with strenuous activity     pantoprazole (PROTONIX) 40 MG tablet Take 1 tablet (40 mg total) by mouth daily. 90 tablet 3   predniSONE (DELTASONE) 50 MG tablet Prednisone 50 mg - take 13 hours prior to test Take another Prednisone 50 mg 7 hours prior to test Take another Prednisone 50 mg 1 hour prior to test Take Benadryl 50 mg 1 hour prior to test 3 tablet 0   roflumilast (DALIRESP) 500 MCG TABS tablet Take 1 tablet (500 mcg total) by mouth daily. 90 tablet 3   Simethicone (GAS RELIEF 80 PO) Take 80 mg by mouth 2 (two) times daily.     traZODone (DESYREL) 100 MG tablet Take 1 tablet (100 mg total) by mouth at bedtime. 90 tablet 3   varenicline (CHANTIX STARTING MONTH PAK) 0.5 MG X 11 & 1 MG X 42 tablet Take one 0.5 mg tablet by mouth once daily for 3 days, then increase to one 0.5 mg tablet twice daily for 4 days, then increase to one 1 mg tablet twice daily. 53 tablet 0   venlafaxine XR  (EFFEXOR-XR) 150 MG 24 hr capsule TAKE 1 CAPSULE DAILY WITH  BREAKFAST (Patient taking differently: Take 150 mg by mouth daily with breakfast. ) 90 capsule 3   No current facility-administered medications for this visit.     Allergies as of 12/24/2018 - Review Complete 12/17/2018  Allergen Reaction Noted   Keflex [cephalexin] Other (See Comments) 07/27/2013   Prednisone Other (See Comments) 08/09/2016   Shellfish allergy Other (See Comments) 07/27/2013   Tetracyclines & related Other (See Comments) 07/27/2013   Dilaudid [hydromorphone hcl] Other (See Comments) 08/09/2016   Incruse ellipta [umeclidinium bromide] Nausea Only 06/09/2017   Tuberculin tests Other (See Comments) 06/23/2017    Family History  Problem Relation Age of Onset   Diabetes Mother    Hypertension Mother    Hypertension Father    Cancer Father        lung    Social History   Socioeconomic History   Marital status: Widowed    Spouse name: Not on file   Number of children: 3   Years of education: Not on file   Highest education level: Not on file  Occupational History   Not on file  Social Needs   Financial resource strain: Somewhat hard   Food insecurity    Worry: Never true    Inability: Never true   Transportation needs    Medical: No    Non-medical: No  Tobacco Use   Smoking status: Former Smoker    Packs/day: 1.00    Years: 44.00    Pack years: 44.00    Types: Cigarettes    Quit date: 11/30/2018    Years since quitting: 0.0   Smokeless tobacco: Never Used   Tobacco comment: Smoker 1-1.5 PPD every day, 11-20-2018 "i got myself  down to half a pack", i am gonna be going on chantix on the 28th"  Substance and Sexual Activity   Alcohol use: Yes   Drug use: Yes    Types: "Crack" cocaine    Comment: 04/04/2017 "nothing since 01/12/2012"   Sexual activity: Never  Lifestyle   Physical activity    Days per week: 0 days    Minutes per session: 0 min   Stress: To some  extent  Relationships   Social connections    Talks on phone: Three times a week    Gets together: Once a week    Attends religious service: Never    Active member of club or organization: No    Attends meetings of clubs or organizations: Never    Relationship status: Widowed   Intimate partner violence    Fear of current or ex partner: No    Emotionally abused: No    Physically abused: No    Forced sexual activity: No  Other Topics Concern   Not on file  Social History Narrative   Not on file    Physical Exam: Vital signs were reviewed. General:   Alert, well-nourished, pleasant and cooperative in NAD.  Examined sitting in a wheelchair. Head:  Normocephalic and atraumatic.  No alopecia. Eyes:  Sclera clear, no icterus.   Conjunctiva pink. Abdomen:  Soft, nontender, normal bowel sounds. No rebound or guarding. No hepatosplenomegaly Extremities:  No gross deformities or edema. Neurologic:  Alert and  oriented x4;  grossly nonfocal Skin:  No obvious rash or bruise. Psych:  Alert and cooperative. Normal mood and affect.   Joan [name] L. Tarri Glenn, MD, MPH [facility] 12/24/2018, 8:32 AM

## 2018-12-24 NOTE — Patient Instructions (Addendum)
I think your gas and bloating is related to multiple things: what you eat, what you drink, using a straw, constipation, and your COPD.    Some food may make your gas and bloating worse. Please avoid carbonated beverages like your creamed soda, caffeinated beverages, sugar substitutes such as Sweet-n-Low, chewing gum and other gas-producing foods such as legumes, onions, cabbage, brussel sprouts, and artificial sweeteners.  Do not gulp foods or liquids! Drinking through a straw also increases the air that you swallow.  All of this can definitely making the gas worse.   Avoid all dairy for at least 2 weeks to see if this makes a difference.   I am recommending a trial of FDGard - 2 capsules taken twice daily for 4 weeks.  Please take FDGard 30 to 60 minutes before meals with water.  There is no distinct pattern of side effects with FDGard, but, sometimes patients experience a mild tingling sensation in the gut within the first 30 minutes. This sensation should subsequently subside.   I also recommend a trial of Linzess for your constipation. If this is too expensive with your insurance, please call the office for alternatives.  Let's plan to check over the next couple of months to see how you are doing.  If things are better we will regroup and consider alternatives.

## 2018-12-28 NOTE — Telephone Encounter (Signed)
There is a telephone encounter created by Drexel Iha on 12/21/2018. Per that documentation it seems that everything was taken care of. Message will be closed.

## 2019-01-01 ENCOUNTER — Telehealth: Payer: Self-pay | Admitting: Pharmacist

## 2019-01-01 NOTE — Telephone Encounter (Signed)
Called pt on 01/01/2019 at 3:00PM and left HIPAA-compliant VM with instructions to call [facility] back   Plan to f/u on smoking cessation  Drexel Iha, PharmD PGY2 [facility] Pharmacy Resident

## 2019-01-04 ENCOUNTER — Telehealth (HOSPITAL_COMMUNITY): Payer: Self-pay | Admitting: Emergency Medicine

## 2019-01-04 ENCOUNTER — Telehealth: Payer: Self-pay | Admitting: Pharmacist

## 2019-01-04 NOTE — Telephone Encounter (Signed)
Reaching out to patient to offer assistance regarding upcoming cardiac imaging study; pt verbalizes understanding of appt date/time, parking situation and where to check in, pre-test NPO status and medications ordered, and verified current allergies; name and call back number provided for further questions should they arise Marchia Bond RN [facility] [phone] office [phone] cell  Pt states she is not going to take any daily medication prior to test. Pt verbalized understanding of allergy pre-med schedule with prednisone and benadryl , I suggested she have a ride home and she insisted that she drive since she takes benadryl BID regularly. Also verbalized understanding of when to take PO metoprolol (going to pick up from pharmacy now)  Pt denied further questions

## 2019-01-04 NOTE — H&P (Signed)
TOTAL HIP REVISION ADMISSION H&P  Patient is admitted for left revision total hip arthroplasty.  Subjective:  Chief Complaint: Left hip pain s/p THA with shortening  HPI: Joan [name], 58 y.o. female, has a history of pain and functional disability in the left hip due to failure / shortening and patient has failed non-surgical conservative treatments for greater than 12 weeks to include NSAID's and/or analgesics, use of assistive devices and activity modification. The indications for the revision total hip arthroplasty are shortnening.  Onset of symptoms was gradual starting 2019 with gradually worsening course since that time.  Prior procedures on the left hip include arthroplasty.  Patient currently rates pain in the left hip at 9 out of 10 with activity.  There is worsening of pain with activity and weight bearing, trendelenberg gait, pain that interfers with activities of daily living and pain with passive range of motion. Patient has evidence of shortening  by imaging studies.  This condition presents safety issues increasing the risk of falls.  There is no current active infection.  Risks, benefits and expectations were discussed with the patient.  Risks including but not limited to the risk of anesthesia, blood clots, nerve damage, blood vessel damage, failure of the prosthesis, infection and up to and including death.  Patient understand the risks, benefits and expectations and wishes to proceed with surgery.   PCP: [name], MD  D/C Plans:       SNF  Post-op Meds:       No Rx given  Tranexamic Acid:      To be given - IV   Decadron:      Is to be given  FYI:      Xarelto (Unable to use ASA, due to breathing issues)  Norco  DME:   Pt already has equipment  PT:   SNF  Pharmacy: [facility] [place]    Patient Active Problem List   Diagnosis Date Noted  . Neck pain 12/18/2018  . Chest pain of uncertain etiology [id number]  . Hypertension   . Medication management  11/06/2018  . Acute on chronic respiratory failure with hypoxia ([place]) 05/21/2018  . Cough 03/25/2018  . Tobacco abuse 01/15/2018  . Hypomagnesemia 12/29/2017  . COPD exacerbation ([place]) 12/28/2017  . Insomnia 12/11/2017  . Pulmonary nodule 11/11/2017  . Carotid artery disease ([place]) 11/05/2017  . Hoarseness 09/09/2017  . OSA on CPAP 08/07/2017  . Family history of heart disease 07/18/2017  . GERD without esophagitis 06/23/2017  . H/O total hip arthroplasty, left 06/18/2017  . Hypothyroidism due to acquired atrophy of thyroid 04/05/2017  . Primary osteoarthritis involving multiple joints 04/05/2017  . Incarcerated ventral hernia 04/04/2017  . Chronic constipation 02/10/2017  . Iron deficiency anemia due to dietary causes 02/10/2017  . Osteoarthritis 02/10/2017  . Dyslipidemia 02/10/2017  . H/O total hip arthroplasty, right 02/05/2017  . Rash 01/08/2017  . Pre-operative clearance 12/16/2016  . Chronic respiratory failure with hypoxia ([place]) 12/16/2016  . Essential hypertension 11/29/2016  . Back pain 11/29/2016  . Vocal cord dysfunction   . COPD, severe ([place]) 09/24/2016  . Anxiety 09/24/2016  . Steroid-induced diabetes ([place]) 09/24/2016   Past Medical History:  Diagnosis Date  . Acute respiratory failure with hypoxia ([place])   . Alcoholism and drug addiction in family   . Anxiety   . Arthritis    "knees, hips, lower back" (09/25/2016)  . Asthma   . Bipolar disorder ([place])   . Chronic bronchitis ([place])    "  all the time" (09/25/2016)  . Chronic leg pain    RLE  . Chronic lower back pain   . COPD (chronic obstructive pulmonary disease) ([place])   . Depression   . Emphysema of lung ([place])   . GERD (gastroesophageal reflux disease)   . Headache   . Hepatitis C    "treated back in 2001-2002"  . High cholesterol   . Hypertension   . Hypothyroidism   . Incarcerated ventral hernia 04/04/2017  . On home oxygen therapy    "2L; at nighttime" (04/04/2017)  . OSA on CPAP   . Pneumonia     "all the time" (09/25/2016)  . Positive PPD    with negative chest x ray  . Tachycardia    patient reports with exertion ; denies any other conditions   . Thyroid disease     Past Surgical History:  Procedure Laterality Date  . BACK SURGERY    . BIOPSY  11/24/2018   Procedure: BIOPSY;  Surgeon: Thornton Park, MD;  Location: [facility];  Service: Gastroenterology;;  . CARDIAC CATHETERIZATION    . COLONOSCOPY    . COLONOSCOPY WITH PROPOFOL N/A 11/24/2018   Procedure: COLONOSCOPY WITH PROPOFOL;  Surgeon: Thornton Park, MD;  Location: [facility];  Service: Gastroenterology;  Laterality: N/A;  . CYST EXCISION     removal of cyst in sinuses  . DILATION AND CURETTAGE OF UTERUS    . ESOPHAGOGASTRODUODENOSCOPY (EGD) WITH PROPOFOL N/A 11/24/2018   Procedure: ESOPHAGOGASTRODUODENOSCOPY (EGD) WITH PROPOFOL;  Surgeon: Thornton Park, MD;  Location: [facility];  Service: Gastroenterology;  Laterality: N/A;  . FOOT SURGERY Bilateral    bone removed from 4th and 5 th toes and put in pin then took pin out  . HERNIA REPAIR    . INCISION AND DRAINAGE  ~ 2014/2015   "removed mesh & infection"  . INCISIONAL HERNIA REPAIR N/A 04/04/2017   Procedure: LAPAROSCOPIC INCISIONAL HERNIA REPAIR WITH MESH;  Surgeon: Fanny Skates, MD;  Location: [facility];  Service: General;  Laterality: N/A;  . LACERATION REPAIR Right    repair wrist artery from laceration from ice skate  . LAPAROSCOPIC INCISIONAL / UMBILICAL / VENTRAL HERNIA REPAIR  04/04/2017   LAPAROSCOPIC INCISIONAL HERNIA REPAIR WITH MESH  . LIPOMA EXCISION Right    fattty lipoma in neck  . NASAL SEPTUM SURGERY    . POSTERIOR LUMBAR FUSION  2015/2016 X 2   "added rods and screws"  . TOTAL HIP ARTHROPLASTY Right 02/05/2017   Procedure: RIGHT TOTAL HIP ARTHROPLASTY;  Surgeon: [name], MD;  Location: [facility];  Service: Orthopedics;  Laterality: Right;  . TOTAL HIP ARTHROPLASTY Left 06/18/2017   Procedure: LEFT TOTAL HIP ARTHROPLASTY;   Surgeon: [name], MD;  Location: [facility];  Service: Orthopedics;  Laterality: Left;  . TUBAL LIGATION    . UMBILICAL HERNIA REPAIR  ~ 2013   w/mesh  . UPPER GI ENDOSCOPY      No current facility-administered medications for this encounter.    Current Outpatient Medications  Medication Sig Dispense Refill Last Dose  . acyclovir (ZOVIRAX) 400 MG tablet Take 1 tablet (400 mg total) by mouth 2 (two) times daily. (Patient taking differently: Take 400 mg by mouth 2 (two) times daily as needed (cold sores). ) 180 tablet 3 Taking  . albuterol (PROVENTIL) (2.5 MG/3ML) 0.083% nebulizer solution Take 3 mLs (2.5 mg total) by nebulization 2 (two) times daily. And every 6 hours as needed.  DX: J44.9 360 mL 3 Taking  . atenolol (  TENORMIN) 25 MG tablet Take 1 tablet (25 mg total) by mouth daily. 90 tablet 3 Taking  . atorvastatin (LIPITOR) 20 MG tablet Take 1 tablet (20 mg total) by mouth daily. 90 tablet 3 Taking  . baclofen (LIORESAL) 10 MG tablet Take 10 mg by mouth at bedtime.   Taking  . budesonide-formoterol (SYMBICORT) 160-4.5 MCG/ACT inhaler Inhale 2 puffs into the lungs 2 (two) times daily. 2 Inhaler 0 Taking  . buPROPion (WELLBUTRIN XL) 150 MG 24 hr tablet Take 1 tablet (150 mg total) by mouth 2 (two) times daily. (Patient taking differently: Take 300 mg by mouth daily. ) 180 tablet 3 Taking  . cyclobenzaprine (FLEXERIL) 10 MG tablet Take 1 tablet (10 mg total) by mouth at bedtime. 90 tablet 1 Taking  . diphenhydrAMINE (BENADRYL) 25 MG tablet Take 25 mg by mouth 2 (two) times daily.    Taking  . docusate sodium (COLACE) 100 MG capsule Take 100 mg by mouth daily.   Taking  . gabapentin (NEURONTIN) 300 MG capsule TAKE 1 CAPSULE BY MOUTH  TWICE DAILY (Patient taking differently: Take 600 mg by mouth at bedtime. Takes 600mg  QHS) 180 capsule 1 Taking  . hydrochlorothiazide (HYDRODIURIL) 25 MG tablet Take 1 tablet (25 mg total) by mouth daily. 90 tablet 3 Taking  . Ipratropium-Albuterol (COMBIVENT  RESPIMAT) 20-100 MCG/ACT AERS respimat Inhale 1 puff into the lungs every 6 (six) hours as needed for wheezing. (Patient taking differently: Inhale 1 puff into the lungs every 6 (six) hours. ) 3 Inhaler 3 Taking  . levothyroxine (SYNTHROID, LEVOTHROID) 25 MCG tablet Take 1 tablet (25 mcg total) by mouth daily before breakfast. 90 tablet 3 Taking  . linaclotide (LINZESS) 145 MCG CAPS capsule Take 1 capsule (145 mcg total) by mouth daily before breakfast. 30 capsule 3   . Liniments (SALONPAS ARTHRITIS PAIN RELIEF EX) Apply 1 patch topically daily as needed (pain).   Taking  . metoprolol tartrate (LOPRESSOR) 100 MG tablet Take 1 tablet (100 mg total) by mouth once for 1 dose. TAKE 1 TABLET TWO HOURS PRIOR TO YOUR CORONARY CTA 1 tablet 0   . montelukast (SINGULAIR) 10 MG tablet Take 1 tablet (10 mg total) by mouth at bedtime. 90 tablet 3 Taking  . Multiple Vitamins-Minerals (WOMENS 50+ MULTI VITAMIN/MIN PO) Take 1 tablet by mouth daily.   Taking  . naproxen (NAPROSYN) 500 MG tablet TAKE 1 TABLET BY MOUTH  TWICE DAILY WITH A MEAL 180 tablet 3 Taking  . nicotine (NICODERM CQ) 14 mg/24hr patch Place 1 patch (14 mg total) onto the skin daily. 28 patch 0 Taking  . nystatin-triamcinolone ointment (MYCOLOG) Apply 1 application topically 2 (two) times daily as needed (applied to skin folds/groins/under breast skin irritation rash.). 100 g 3 Taking  . OXYGEN Inhale 2 L into the lungs at bedtime. May use 2.5 - 3 L during the day with strenuous activity   Taking  . pantoprazole (PROTONIX) 40 MG tablet Take 1 tablet (40 mg total) by mouth daily. 90 tablet 3 Taking  . predniSONE (DELTASONE) 50 MG tablet Prednisone 50 mg - take 13 hours prior to test Take another Prednisone 50 mg 7 hours prior to test Take another Prednisone 50 mg 1 hour prior to test Take Benadryl 50 mg 1 hour prior to test 3 tablet 0 Taking  . roflumilast (DALIRESP) 500 MCG TABS tablet Take 1 tablet (500 mcg total) by mouth daily. 90 tablet 3 Taking   . Simethicone (GAS RELIEF 80 PO) Take 80 mg  by mouth 2 (two) times daily.   Taking  . traZODone (DESYREL) 100 MG tablet Take 1 tablet (100 mg total) by mouth at bedtime. 90 tablet 3 Taking  . varenicline (CHANTIX STARTING MONTH PAK) 0.5 MG X 11 & 1 MG X 42 tablet Take one 0.5 mg tablet by mouth once daily for 3 days, then increase to one 0.5 mg tablet twice daily for 4 days, then increase to one 1 mg tablet twice daily. 53 tablet 0 Taking  . venlafaxine XR (EFFEXOR-XR) 150 MG 24 hr capsule TAKE 1 CAPSULE DAILY WITH  BREAKFAST (Patient taking differently: Take 150 mg by mouth daily with breakfast. ) 90 capsule 3 Taking   Allergies  Allergen Reactions  . Keflex [Cephalexin] Other (See Comments)    unspecified  . Prednisone Other (See Comments)    "counter reacts"  . Shellfish Allergy Other (See Comments)    Stomach cramps  . Tetracyclines & Related Other (See Comments)    unspecified  . Dilaudid [Hydromorphone Hcl] Other (See Comments)    "Lethargy"  . Incruse Ellipta [Umeclidinium Bromide] Nausea Only  . Tuberculin Tests Other (See Comments)    Social History   Tobacco Use  . Smoking status: Former Smoker    Packs/day: 1.00    Years: 44.00    Pack years: 44.00    Types: Cigarettes    Quit date: 11/30/2018    Years since quitting: 0.0  . Smokeless tobacco: Never Used  . Tobacco comment: Smoker 1-1.5 PPD every day, 11-20-2018 "i got myself down to half a pack", i am gonna be going on chantix on the 28th"  Substance Use Topics  . Alcohol use: Yes    Family History  Problem Relation Age of Onset  . Diabetes Mother   . Hypertension Mother   . Hypertension Father   . Cancer Father        lung      Review of Systems  Constitutional: Negative.   HENT: Negative.   Eyes: Negative.   Respiratory: Positive for shortness of breath.   Cardiovascular: Negative.   Gastrointestinal: Positive for heartburn.  Genitourinary: Negative.   Musculoskeletal: Positive for joint pain.   Skin: Negative.   Neurological: Negative.   Endo/Heme/Allergies: Negative.   Psychiatric/Behavioral: Positive for depression. The patient is nervous/anxious.     Objective:  Physical Exam  Constitutional: She is oriented to person, place, and time. She appears well-developed.  HENT:  Head: Normocephalic.  Eyes: Pupils are equal, round, and reactive to light.  Neck: Neck supple. No JVD present. No tracheal deviation present. No thyromegaly present.  Cardiovascular: Normal rate, regular rhythm and intact distal pulses.  Respiratory: Effort normal and breath sounds normal. No respiratory distress. She has no wheezes.  GI: Soft. There is no abdominal tenderness. There is no guarding.  Musculoskeletal:     Left hip: She exhibits decreased range of motion, decreased strength, tenderness and bony tenderness. She exhibits no swelling, no crepitus and no laceration (healed previous incision).  Lymphadenopathy:    She has no cervical adenopathy.  Neurological: She is alert and oriented to person, place, and time. A sensory deficit (bilateral LE neuropathy) is present.  Skin: Skin is warm and dry.  Psychiatric: She has a normal mood and affect.      Labs:  Estimated body mass index is 29.95 kg/m as calculated from the following:   Height as of 12/24/18: 5\' 8"  (1.727 m).   Weight as of 12/24/18: 89.4 kg.  Imaging Review:  Plain radiographs demonstrate severe degenerative joint disease of the left hip. There is evidence of shortening of the femoral component.The bone quality appears to be good for age and reported activity level.    Assessment/Plan:  End stage arthritis, left hip with failed previous arthroplasty.  The patient history, physical examination, clinical judgement of the provider and imaging studies are consistent with end stage degenerative joint disease of the left hip(s), previous total hip arthroplasty. Revision total hip arthroplasty is deemed medically necessary.  The treatment options including medical management, injection therapy, arthroscopy and arthroplasty were discussed at length. The risks and benefits of total hip arthroplasty were presented and reviewed. The risks due to aseptic loosening, infection, stiffness, dislocation/subluxation,  thromboembolic complications and other imponderables were discussed.  The patient acknowledged the explanation, agreed to proceed with the plan and consent was signed. Patient is being admitted for inpatient treatment for surgery, pain control, PT, OT, prophylactic antibiotics, VTE prophylaxis, progressive ambulation and ADL's and discharge planning. The patient is planning to be discharged to skilled nursing facility.   [place] Alyana Kreiter   PA-C  01/04/2019, 10:30 AM

## 2019-01-04 NOTE — Telephone Encounter (Signed)
Called pt on 01/04/2019 at 3:11 PM. She states she has been doing well with quitting smoking. She has surgery 01/30/2019 on her left leg. Patient is receiving NRT patches and Chantix through [facility]. She is concerned with the status of her patient assistance programs for her pulmonary medications. Patients follows with [facility] once weekly. Patient states she has received nicotine patches (21 mg) and Chantix from [facility]. She put on nicotine patch (21 mg) on 12/28/2018 (last day will be 01/25/2019). [facility] has instructed nicotine 21 mg patch x 4 weeks, nicotine 14 mg patch x 2 weeks, then nicotine 7 mg patch x 2 weeks.  Age when started using tobacco on a daily basis: 58 years old (would smoke 2-3 packs per day) Preferred cigarette brand: Marlboro lights then switched to [place]  Tobacco use: none; has smoked 5 cigarettes since October 5th 2020  Triggers: stress, specifically if she hears bad news, is in a fight with a family member, or if she is bored.  Current smoking cessation agents: chantix 1 mg twice daily, nicotine patch 21 mg daily (started 12/28/2018; last day 01/25/2019) Prior smoking cessation agents: gum or lozenge(caused her throat to burn and were painful)  Non-pharmacologic methods: organizing room, cuddle with her brother's cat Elyse Hsu) or dog Berle Mull) when she gets an urge to smoke, praying, eating a lollipop, calls [facility]  Motivation to quit: healthy recovery from surgery, breathe easier Rates IMPORTANCE of quitting tobacco on 1-10 scale of 10 Rates CONFIDENCE of quitting tobacco on 1-10 scale of 6 Goal: maintain not smoking any cigarettes  Plan:   1. Smoking Cessation: continue chantix 1 mg twice daily, nicotine 21 mg daily for 28 days (01/25/19) then nicotine patch 14 mg x 2 weeks then nictoine patch 7 mg x 2 weeks 2. Patient assistance program: Patient would like to be followed up regarding status of when she should re-apply for PAP programs.    Follow up:  01/25/2019 - plan to remind pt this is her last day of 21 mg patch (she will switch to 14 mg patch on 01/26/2019).

## 2019-01-05 ENCOUNTER — Ambulatory Visit (HOSPITAL_COMMUNITY)
Admission: RE | Admit: 2019-01-05 | Discharge: 2019-01-05 | Disposition: A | Discharge status: Home / Self Care | Payer: Medicare Other | Source: Ambulatory Visit | Attending: Cardiovascular Disease | Admitting: Cardiovascular Disease

## 2019-01-05 ENCOUNTER — Other Ambulatory Visit: Payer: Self-pay

## 2019-01-05 DIAGNOSIS — I2510 Atherosclerotic heart disease of native coronary artery without angina pectoris: Secondary | ICD-10-CM | POA: Insufficient documentation

## 2019-01-05 DIAGNOSIS — R079 Chest pain, unspecified: Secondary | ICD-10-CM | POA: Diagnosis not present

## 2019-01-05 MED ORDER — IOHEXOL 350 MG/ML SOLN
80.0000 mL | Freq: Once | INTRAVENOUS | Status: AC | PRN
  Administered 2019-01-05: 80 mL via INTRAVENOUS

## 2019-01-05 MED ORDER — METOPROLOL TARTRATE 5 MG/5ML IV SOLN
INTRAVENOUS | Status: AC
  Administered 2019-01-05: 5 mg
  Filled 2019-01-05: qty 20

## 2019-01-05 MED ORDER — NITROGLYCERIN 0.4 MG SL SUBL
0.8000 mg | SUBLINGUAL_TABLET | SUBLINGUAL | Status: DC | PRN
  Administered 2019-01-05: 0.8 mg via SUBLINGUAL

## 2019-01-05 MED ORDER — NITROGLYCERIN 0.4 MG SL SUBL
SUBLINGUAL_TABLET | SUBLINGUAL | Status: AC
  Filled 2019-01-05: qty 2

## 2019-01-05 MED ORDER — METOPROLOL TARTRATE 5 MG/5ML IV SOLN
5.0000 mg | INTRAVENOUS | Status: DC | PRN
  Administered 2019-01-05: 5 mg via INTRAVENOUS

## 2019-01-07 DIAGNOSIS — R079 Chest pain, unspecified: Secondary | ICD-10-CM | POA: Diagnosis not present

## 2019-01-09 ENCOUNTER — Other Ambulatory Visit: Payer: Self-pay | Admitting: Internal Medicine

## 2019-01-09 DIAGNOSIS — J449 Chronic obstructive pulmonary disease, unspecified: Secondary | ICD-10-CM | POA: Diagnosis not present

## 2019-01-09 DIAGNOSIS — J441 Chronic obstructive pulmonary disease with (acute) exacerbation: Secondary | ICD-10-CM | POA: Diagnosis not present

## 2019-01-09 DIAGNOSIS — J9611 Chronic respiratory failure with hypoxia: Secondary | ICD-10-CM | POA: Diagnosis not present

## 2019-01-11 ENCOUNTER — Telehealth: Payer: Self-pay

## 2019-01-11 NOTE — Telephone Encounter (Signed)
[name], NP sent to Jannette Spanner, CMA        Tam,  Please order and schedule PFT's for this patient./ Th   PCC's can you schedule for pt?

## 2019-01-11 NOTE — Telephone Encounter (Signed)
I will call pt & schedule PFT.

## 2019-01-11 NOTE — Telephone Encounter (Signed)
I scheduled pft for 12/3 and have left a vm for pt to call me for appt info & to sched covid test.

## 2019-01-13 ENCOUNTER — Other Ambulatory Visit (HOSPITAL_COMMUNITY): Payer: Medicare Other

## 2019-01-13 ENCOUNTER — Telehealth: Payer: Self-pay | Admitting: *Deleted

## 2019-01-13 ENCOUNTER — Telehealth: Payer: Self-pay | Admitting: Gastroenterology

## 2019-01-13 NOTE — Telephone Encounter (Addendum)
  Left message for pt to call   ----- Message from Lorretta Harp, MD sent at 01/10/2019  6:07 PM EST ----- No signif CAD on Cor CTA by FFR analysis

## 2019-01-13 NOTE — Telephone Encounter (Signed)
Thank you :)

## 2019-01-13 NOTE — Telephone Encounter (Addendum)
FYI- After speaking to the patient, she reports the 145 mcg dose Linzess gave her severe diarrhea so she has been taking Colace. She reports she is sometimes "impacted" but wants something for her constipation. The patient has been left 72 mcg dose Linzess samples at the front desk to see if this causes diarrhea. Patient aware.

## 2019-01-14 ENCOUNTER — Telehealth: Payer: Self-pay | Admitting: Acute Care

## 2019-01-14 ENCOUNTER — Ambulatory Visit (INDEPENDENT_AMBULATORY_CARE_PROVIDER_SITE_OTHER): Payer: Medicare Other | Admitting: Licensed Clinical Social Worker

## 2019-01-14 DIAGNOSIS — F4312 Post-traumatic stress disorder, chronic: Secondary | ICD-10-CM | POA: Diagnosis not present

## 2019-01-14 DIAGNOSIS — R443 Hallucinations, unspecified: Secondary | ICD-10-CM

## 2019-01-14 NOTE — Telephone Encounter (Signed)
Spoke to pt & gave her appt info.  Nothing further needed. 

## 2019-01-14 NOTE — Telephone Encounter (Signed)
I am forwarding this to the [facility] pool to reschedule the PFT thanks

## 2019-01-14 NOTE — Progress Notes (Signed)
Virtual Visit via Telephone Note  I connected with Joan [name] on 01/14/19 at 11:00 AM EST by telephone and verified that I am speaking with the correct person using two identifiers.   I discussed the limitations, risks, security and privacy concerns of performing an evaluation and management service by telephone and the availability of in person appointments. I also discussed with the patient that there may be a patient responsible charge related to this service. The patient expressed understanding and agreed to proceed.  I discussed the assessment and treatment plan with the patient. The patient was provided an opportunity to ask questions and all were answered. The patient agreed with the plan and demonstrated an understanding of the instructions.   The patient was advised to call back or seek an in-person evaluation if the symptoms worsen or if the condition fails to improve as anticipated.  I provided 52 minutes of non-face-to-face time during this encounter.   THERAPIST PROGRESS NOTE  Session Time: 11:02 AM to 11:54 AM  Participation Level: Active  Behavioral Response: CasualAlertEuthymic and appropriate  Type of Therapy: Individual Therapy  Treatment Goals addressed:  PTSD  Interventions: Solution Focused, Strength-based, Supportive and Other: coping with stressors in family relationships, addressing hallucinations  Summary: Joan [name] is a 58 y.o. female who presents with not excited about the holidays-Christmas is about money and don't have money. Thanksgiving eat [place] and don't like [place]. I heard it is going to be at sister's. I am up in the air. Patient is still working on letting go of things with what they said and how they treated her so still having a hard time with it. It will have to be in patient's time she explains. "She is wearing my clothes and yet she tells me she doesn't know what happened to them." Sisters from [name] are living here now. Need to "Get  through things so I can let it go just hard". Why did they tell me to go there if didn't want me there. Nobody gave me an answer. Do what they want to people and then show up and give you a hug. I am not like I am stubborn and hold on to things. Not going to engage in conversations knowing all the things you did say and did. I can let it go but doesn't mean were buddies. Take a awhile. They ripped my heart out. I can't let go that easily. Reviewed all they needed to do was to tell patient the truth. I left my stuff and people went through it. In time I will learn to let go. Give me time. My time. Quit smoking. If I quit drugs I can quit cigarettes. I had to get mind to get ready and it was mine decision to quit. I couldn't go from recliner to kitchen. Tired to not being able to breath and move around. Notice difference. Feel better. Still get tired. Not easy. Has upcoming operation to fix left hip too short really hard time with that limp. When stand have to pick one or the other leg. Starts to feel numb and cardboard feeling. Too much weight on right side so impacting lower back. Working on fixing herself has been working on it for [employer].  Shares she sees shadows moving, stop at night somebody standing there, sees shadows. Stop and take a double look. Not afraid doesn't scare me. Cat and dog were walking around on the bed walking around me sometimes it scares me but most part let  it go. Put a pillow to back. Why pillows around me. Under me in the bed in mattress hears voices. Psychotic symptoms came after husband died.  Reviewed could be related to grief symptoms patient says died in 2010/11/10 and doesn't think it would still be grief symptoms. Reviewed impact of drug use. Patient realizes that crack eats at brain and explains why on Effexor. Sees spirits and shadow and it is gone. Discussed hallucination after coming down from using crack. Body standing by the bed and head kept changing. Stop usage in  2013-2014 and only have had slips. Shares had a slip in [place], doesn't think about it here. Not going to go to [place]  by myself wind up in trouble. I am not who I am when I am there and become a different person, not her usual sober self. Shares that hallucinations sometimes scares me and startles me. Voices coming out of the house. Hear them now and then, hear in attic. Wants to address hallucinations. Will be referred to psychiatrist.   Suicidal/Homicidal: No  Therapist Response: Therapist offered supportive, open questions, active listening, reviewed symptoms, discussed stressors, facilitated expressions of thoughts and feelings.  Provided education on patient's symptoms that indicate psychotic symptoms both with hallucinations and paranoid thinking.  Therapist explained good chance related to her crack use as chronic symptoms include paranoia and hallucinations.  Reviewed we can work on it in therapy by challenging her paranoid thinking as distorted also focus less on symptoms, letting go and focusing on something else but more than likely there were biological reasons for them so a medicine that helps with the symptoms would help address and decrease them.  Therapist will refer to psychiatrist in office.  Processed patient's feelings related to her toward systems both validating feelings and process that everyone has her own time to work through things also healthy way is letting go so one does not say impacted and burdened from mistreatment from others and frees oneself.  Noted significant strength patient giving up smoking and the positive impact will have to quality of life, and body feeling better which will help her feel better.  It out the significant goal indicates her ability to work on other goals.  Reviewed strategies to help brain as a way to work on healing body and also addressing aging process.  Plan: Return again in 3-4 weeks.2.  Therapist work with patient on coping  strategies for stressors, coping with psychotic symptoms,  Diagnosis: Axis I: PTSD, hallucinations    Axis II: No diagnosis    [name], LCSW 01/14/2019

## 2019-01-15 DIAGNOSIS — J441 Chronic obstructive pulmonary disease with (acute) exacerbation: Secondary | ICD-10-CM | POA: Diagnosis not present

## 2019-01-15 DIAGNOSIS — G4733 Obstructive sleep apnea (adult) (pediatric): Secondary | ICD-10-CM | POA: Diagnosis not present

## 2019-01-15 DIAGNOSIS — J449 Chronic obstructive pulmonary disease, unspecified: Secondary | ICD-10-CM | POA: Diagnosis not present

## 2019-01-15 NOTE — Telephone Encounter (Signed)
Follow up ° ° ° ° °Please return call to patient °

## 2019-01-15 NOTE — Telephone Encounter (Signed)
Message forwarded to Hyman Hopes to confirm the order for covid has been cancelled.

## 2019-01-15 NOTE — Telephone Encounter (Signed)
Spoke to pt & resched her covid test, pft & follow up with SG.  Nothing further needed.

## 2019-01-15 NOTE — Telephone Encounter (Signed)
Pt aware of cardiac ct results ./cy

## 2019-01-18 NOTE — Patient Instructions (Addendum)
DUE TO COVID-19 ONLY ONE VISITOR IS ALLOWED TO COME WITH YOU AND STAY IN THE WAITING ROOM ONLY DURING PRE OP AND PROCEDURE DAY OF SURGERY. THE 1 VISITOR MAY VISIT WITH YOU AFTER SURGERY IN YOUR PRIVATE ROOM DURING VISITING HOURS ONLY!  YOU NEED TO HAVE A COVID 19 TEST ON__Friday 11/27/2020_____ @___0845  am____, THIS TEST MUST BE DONE BEFORE SURGERY, COME  [address] [place] , [id number].  ([place]) ONCE YOUR COVID TEST IS COMPLETED, PLEASE BEGIN THE QUARANTINE INSTRUCTIONS AS OUTLINED IN YOUR HANDOUT.                Joan [name]     Your procedure is scheduled on: Tuesday 01/26/2019   Report to [facility] Main  Entrance    Report to admitting at  1150  AM               Please bring CPAP mask and tubing with you the morning of surgery!   Call this number if you have problems the morning of surgery [phone]    Remember: Do not eat food  :After Midnight.   NO SOLID FOOD AFTER MIDNIGHT THE NIGHT PRIOR TO SURGERY. NOTHING BY MOUTH EXCEPT CLEAR LIQUIDS UNTIL 1120 am .     PLEASE FINISH  Gatorade G 2 DRINK PER SURGEON ORDER  WHICH NEEDS TO BE COMPLETED AT 1120 am .   CLEAR LIQUID DIET   Foods Allowed                                                                     Foods Excluded  Coffee and tea, regular and decaf                             liquids that you cannot  Plain Jell-O any favor except red or purple                                           see through such as: Fruit ices (not with fruit pulp)                                     milk, soups, orange juice  Iced Popsicles                                    All solid food Carbonated beverages, regular and diet                                    Cranberry, grape and apple juices Sports drinks like Gatorade Lightly seasoned clear broth or consume(fat free) Sugar, honey syrup  Sample Menu Breakfast                                Lunch  Supper Cranberry juice                    Beef broth                            Chicken broth Jell-O                                     Grape juice                           Apple juice Coffee or tea                        Jell-O                                      Popsicle                                                Coffee or tea                        Coffee or tea  _____________________________________________________________________       BRUSH YOUR TEETH MORNING OF SURGERY AND RINSE YOUR MOUTH OUT, NO CHEWING GUM CANDY OR MINTS.     Take these medicines the morning of surgery with A SIP OF WATER:Benadryl, Atorvastatin (Lipitor), Levothyroxine (Synthroid), Pantoprazole (Protonix), Bupropion (Wellbutrin XL), Venlafaxine XR (Effexor- XR), Atenolol (Tenormin), use Combivent inhaler and Symbicort inhaler and use Albuterol inhaler if needed and bring inhalers with you to the hospital.   DO NOT TAKE ANY DIABETIC MEDICATIONS DAY OF YOUR SURGERY! (Diet controlled)                               You may not have any metal on your body including hair pins and              piercings  Do not wear jewelry, make-up, lotions, powders or perfumes, deodorant             Do not wear nail polish on your fingernails.  Do not shave  48 hours prior to surgery.               Do not bring valuables to the hospital. [place].  Contacts, dentures or bridgework may not be worn into surgery.  Leave suitcase in the car. After surgery it may be brought to your room.                   Please read over the following fact sheets you were given: _____________________________________________________________________             [facility] - Preparing for Surgery Before surgery, you can play an important role.  Because skin is not sterile, your skin needs to be as free of germs as possible.  You can reduce the number of germs on your skin  by washing with  CHG (chlorahexidine gluconate) soap before surgery.  CHG is an antiseptic cleaner which kills germs and bonds with the skin to continue killing germs even after washing. Please DO NOT use if you have an allergy to CHG or antibacterial soaps.  If your skin becomes reddened/irritated stop using the CHG and inform your nurse when you arrive at Short Stay. Do not shave (including legs and underarms) for at least 48 hours prior to the first CHG shower.  You may shave your face/neck. Please follow these instructions carefully:  1.  Shower with CHG Soap the night before surgery and the  morning of Surgery.  2.  If you choose to wash your hair, wash your hair first as usual with your  normal  shampoo.  3.  After you shampoo, rinse your hair and body thoroughly to remove the  shampoo.                           4.  Use CHG as you would any other liquid soap.  You can apply chg directly  to the skin and wash                       Gently with a scrungie or clean washcloth.  5.  Apply the CHG Soap to your body ONLY FROM THE NECK DOWN.   Do not use on face/ open                           Wound or open sores. Avoid contact with eyes, ears mouth and genitals (private parts).                       Wash face,  Genitals (private parts) with your normal soap.             6.  Wash thoroughly, paying special attention to the area where your surgery  will be performed.  7.  Thoroughly rinse your body with warm water from the neck down.  8.  DO NOT shower/wash with your normal soap after using and rinsing off  the CHG Soap.                9.  Pat yourself dry with a clean towel.            10.  Wear clean pajamas.            11.  Place clean sheets on your bed the night of your first shower and do not  sleep with pets. Day of Surgery : Do not apply any lotions/deodorants the morning of surgery.  Please wear clean clothes to the hospital/surgery center.  FAILURE TO FOLLOW THESE INSTRUCTIONS MAY RESULT IN THE CANCELLATION  OF YOUR SURGERY PATIENT SIGNATURE_________________________________  NURSE SIGNATURE__________________________________  ________________________________________________________________________  WHAT IS A BLOOD TRANSFUSION? Blood Transfusion Information  A transfusion is the replacement of blood or some of its parts. Blood is made up of multiple cells which provide different functions.  Red blood cells carry oxygen and are used for blood loss replacement.  White blood cells fight against infection.  Platelets control bleeding.  Plasma helps clot blood.  Other blood products are available for specialized needs, such as hemophilia or other clotting disorders. BEFORE THE TRANSFUSION  Who gives blood for transfusions?   Healthy volunteers who are fully evaluated to make sure their  blood is safe. This is blood bank blood. Transfusion therapy is the safest it has ever been in the practice of medicine. Before blood is taken from a donor, a complete history is taken to make sure that person has no history of diseases nor engages in risky social behavior (examples are intravenous drug use or sexual activity with multiple partners). The donor's travel history is screened to minimize risk of transmitting infections, such as malaria. The donated blood is tested for signs of infectious diseases, such as HIV and hepatitis. The blood is then tested to be sure it is compatible with you in order to minimize the chance of a transfusion reaction. If you or a relative donates blood, this is often done in anticipation of surgery and is not appropriate for emergency situations. It takes many days to process the donated blood. RISKS AND COMPLICATIONS Although transfusion therapy is very safe and saves many lives, the main dangers of transfusion include:   Getting an infectious disease.  Developing a transfusion reaction. This is an allergic reaction to something in the blood you were given. Every precaution is  taken to prevent this. The decision to have a blood transfusion has been considered carefully by your caregiver before blood is given. Blood is not given unless the benefits outweigh the risks. AFTER THE TRANSFUSION  Right after receiving a blood transfusion, you will usually feel much better and more energetic. This is especially true if your red blood cells have gotten low (anemic). The transfusion raises the level of the red blood cells which carry oxygen, and this usually causes an energy increase.  The nurse administering the transfusion will monitor you carefully for complications. HOME CARE INSTRUCTIONS  No special instructions are needed after a transfusion. You may find your energy is better. Speak with your caregiver about any limitations on activity for underlying diseases you may have. SEEK MEDICAL CARE IF:   Your condition is not improving after your transfusion.  You develop redness or irritation at the intravenous (IV) site. SEEK IMMEDIATE MEDICAL CARE IF:  Any of the following symptoms occur over the next 12 hours:  Shaking chills.  You have a temperature by mouth above 102 F (38.9 C), not controlled by medicine.  Chest, back, or muscle pain.  People around you feel you are not acting correctly or are confused.  Shortness of breath or difficulty breathing.  Dizziness and fainting.  You get a rash or develop hives.  You have a decrease in urine output.  Your urine turns a dark color or changes to pink, red, or brown. Any of the following symptoms occur over the next 10 days:  You have a temperature by mouth above 102 F (38.9 C), not controlled by medicine.  Shortness of breath.  Weakness after normal activity.  The white part of the eye turns yellow (jaundice).  You have a decrease in the amount of urine or are urinating less often.  Your urine turns a dark color or changes to pink, red, or brown. Document Released: 02/09/2000 Document Revised:  05/06/2011 Document Reviewed: 09/28/2007 [facility] Patient Information 2014 [place], [place].  _______________________________________________________________________

## 2019-01-20 ENCOUNTER — Encounter (HOSPITAL_COMMUNITY)
Admission: RE | Admit: 2019-01-20 | Discharge: 2019-01-20 | Disposition: A | Discharge status: Home / Self Care | Payer: Medicare Other | Source: Ambulatory Visit | Attending: Orthopedic Surgery | Admitting: Orthopedic Surgery

## 2019-01-20 ENCOUNTER — Encounter (HOSPITAL_COMMUNITY): Payer: Self-pay

## 2019-01-20 ENCOUNTER — Other Ambulatory Visit: Payer: Self-pay

## 2019-01-20 DIAGNOSIS — Z7951 Long term (current) use of inhaled steroids: Secondary | ICD-10-CM | POA: Insufficient documentation

## 2019-01-20 DIAGNOSIS — E039 Hypothyroidism, unspecified: Secondary | ICD-10-CM | POA: Diagnosis not present

## 2019-01-20 DIAGNOSIS — Z79890 Hormone replacement therapy (postmenopausal): Secondary | ICD-10-CM | POA: Insufficient documentation

## 2019-01-20 DIAGNOSIS — K219 Gastro-esophageal reflux disease without esophagitis: Secondary | ICD-10-CM | POA: Diagnosis not present

## 2019-01-20 DIAGNOSIS — F319 Bipolar disorder, unspecified: Secondary | ICD-10-CM | POA: Insufficient documentation

## 2019-01-20 DIAGNOSIS — Z01812 Encounter for preprocedural laboratory examination: Secondary | ICD-10-CM | POA: Insufficient documentation

## 2019-01-20 DIAGNOSIS — J449 Chronic obstructive pulmonary disease, unspecified: Secondary | ICD-10-CM | POA: Insufficient documentation

## 2019-01-20 DIAGNOSIS — G4733 Obstructive sleep apnea (adult) (pediatric): Secondary | ICD-10-CM | POA: Diagnosis not present

## 2019-01-20 DIAGNOSIS — M1612 Unilateral primary osteoarthritis, left hip: Secondary | ICD-10-CM | POA: Insufficient documentation

## 2019-01-20 DIAGNOSIS — I10 Essential (primary) hypertension: Secondary | ICD-10-CM | POA: Diagnosis not present

## 2019-01-20 DIAGNOSIS — E119 Type 2 diabetes mellitus without complications: Secondary | ICD-10-CM | POA: Diagnosis not present

## 2019-01-20 DIAGNOSIS — Z79899 Other long term (current) drug therapy: Secondary | ICD-10-CM | POA: Diagnosis not present

## 2019-01-20 LAB — BASIC METABOLIC PANEL
Anion gap: 11 (ref 5–15)
BUN: 19 mg/dL (ref 6–20)
CO2: 29 mmol/L (ref 22–32)
Calcium: 9.3 mg/dL (ref 8.9–10.3)
Chloride: 98 mmol/L (ref 98–111)
Creatinine, Ser: 1.1 mg/dL — ABNORMAL HIGH (ref 0.44–1.00)
GFR calc Af Amer: >60 mL/min (ref >60)
GFR calc non Af Amer: 56 mL/min — ABNORMAL LOW (ref >60)
Glucose, Bld: 138 mg/dL — ABNORMAL HIGH (ref 70–99)
Potassium: 3.5 mmol/L (ref 3.5–5.1)
Sodium: 138 mmol/L (ref 135–145)

## 2019-01-20 LAB — CBC
HCT: 40.3 % (ref 36.0–46.0)
Hemoglobin: 13.3 g/dL (ref 12.0–15.0)
MCH: 32.4 pg (ref 26.0–34.0)
MCHC: 33 g/dL (ref 30.0–36.0)
MCV: 98.3 fL (ref 80.0–100.0)
Platelets: 285 10*3/uL (ref 150–400)
RBC: 4.1 MIL/uL (ref 3.87–5.11)
RDW: 13.4 % (ref 11.5–15.5)
WBC: 12.1 10*3/uL — ABNORMAL HIGH (ref 4.0–10.5)
nRBC: 0 % (ref 0.0–0.2)

## 2019-01-20 LAB — GLUCOSE, CAPILLARY: Glucose-Capillary: 133 mg/dL — ABNORMAL HIGH (ref 70–99)

## 2019-01-20 LAB — SURGICAL PCR SCREEN
MRSA, PCR: NEGATIVE
Staphylococcus aureus: NEGATIVE

## 2019-01-20 LAB — HEPATIC FUNCTION PANEL
ALT: 30 U/L (ref 0–44)
AST: 20 U/L (ref 15–41)
Albumin: 4 g/dL (ref 3.5–5.0)
Alkaline Phosphatase: 133 U/L — ABNORMAL HIGH (ref 38–126)
Bilirubin, Direct: 0.1 mg/dL (ref 0.0–0.2)
Indirect Bilirubin: 0.3 mg/dL (ref 0.3–0.9)
Total Bilirubin: 0.4 mg/dL (ref 0.3–1.2)
Total Protein: 7 g/dL (ref 6.5–8.1)

## 2019-01-20 NOTE — Progress Notes (Addendum)
PCP Dr. Pricilla Holm-  Cardiologist - Dr. Quay Burow  LOV -12/09/2018 EPIC  01/07/2019-CT coronary Morph w/CTA in [place]  Chest x-ray - 11/09/2018 2 view EPIC EKG - 12/09/2018 Epic Stress Test - 08/19/2017  ECHO -09/27/2016 Epic  Cardiac Cath - 2017 in [place]  Pulmonary-11/30/2018 with Maggie Schwalbe in [place] - 03/23/2017-Epic Dr. Elsworth Soho CPAP - yes with oxygen 2 liters bedtime  Fasting Blood Sugar -  Checks Blood Sugar __0___ times a day  Blood Thinner Instructions:N/A Aspirin Instructions:N/A Last Dose:N/A  Anesthesia review:  Chart given to Konrad Felix, PA to review.  Patient has a history of asthma, emphysema, COPD, Hep. C (treated in 2001), HTN, OSA on CPAP.  Patient denies shortness of breath, fever, cough and chest pain at PAT appointment   Patient verbalized understanding of instructions that were given to them at the PAT appointment. Patient was also instructed that they will need to review over the PAT instructions again at home before surgery.

## 2019-01-22 ENCOUNTER — Other Ambulatory Visit (HOSPITAL_COMMUNITY)
Admission: RE | Admit: 2019-01-22 | Discharge: 2019-01-22 | Disposition: A | Discharge status: Home / Self Care | Payer: Medicare Other | Source: Ambulatory Visit | Attending: Orthopedic Surgery | Admitting: Orthopedic Surgery

## 2019-01-22 DIAGNOSIS — Z01812 Encounter for preprocedural laboratory examination: Secondary | ICD-10-CM | POA: Diagnosis not present

## 2019-01-22 DIAGNOSIS — Z20828 Contact with and (suspected) exposure to other viral communicable diseases: Secondary | ICD-10-CM | POA: Insufficient documentation

## 2019-01-22 LAB — SARS CORONAVIRUS 2 (TAT 6-24 HRS): SARS Coronavirus 2: NEGATIVE

## 2019-01-22 NOTE — Progress Notes (Signed)
Anesthesia Chart Review   Case: [id number] Date/Time: 01/26/19 1405   Procedure: TOTAL HIP REVISION (Left Hip) - 2 hrs   Anesthesia type: Spinal   Pre-op diagnosis: Failed left total hip   Location: [facility] ROOM 10 / [facility]   Surgeon: Paralee Cancel, MD      DISCUSSION:58 y.o. former smoker (44 pack years, quit 11/30/2018) with h/o COPD, hypothyroidism, asthma, HTN, bipolar disorder, GERD, OSA w/cpap and 2L O2 at night, DM, failed left total hip scheduled for above procedure 01/26/2019 with Dr. Paralee Cancel.   Pt cleared by PCP, Dr. Sharlet Salina, 12/22/2018 (on chart).   Last seen by cardiologist. Dr. Quay Burow, 12/09/2018.  Per OV note, "Ms. Kaminska has chest pain fairly frequently.  It is somewhat atypical with episodes of sharp pain the last for seconds at a time but also episodes of chest pressure.  She had a negative Myoview stress test performed 08/19/2017.  Because of her strong family history as well as her history of tobacco abuse in addition to the recent chest CT performed 11/26/2018 that showed calcified plaque in her left main LAD and circumflex coronary arteries and get a get a coronary CTA to further evaluate."  CTA 01/05/2019 without significant stenosis.   Stable at PAT visit. Anticipate pt can proceed with planned procedure barring acute status change.   VS: BP 131/81   Pulse 72   Temp 36.5 C (Oral)   Resp 18   Ht 5\' 8"  (1.727 m)   Wt 90.4 kg   SpO2 94%   BMI 30.29 kg/m   PROVIDERS: [name], MD is PCP   Quay Burow, MD is Cardiologist  LABS: Labs reviewed: Acceptable for surgery. (all labs ordered are listed, but only abnormal results are displayed)  Labs Reviewed  BASIC METABOLIC PANEL - Abnormal; Notable for the following components:      Result Value   Glucose, Bld 138 (*)    Creatinine, Ser 1.10 (*)    GFR calc non Af Amer 56 (*)    All other components within normal limits  CBC - Abnormal; Notable for the following components:   WBC 12.1 (*)     All other components within normal limits  GLUCOSE, CAPILLARY - Abnormal; Notable for the following components:   Glucose-Capillary 133 (*)    All other components within normal limits  HEPATIC FUNCTION PANEL - Abnormal; Notable for the following components:   Alkaline Phosphatase 133 (*)    All other components within normal limits  SURGICAL PCR SCREEN  TYPE AND SCREEN     IMAGES: CT Coronary 01/05/2019 FINDINGS: FFRct analysis was performed on the original cardiac CT angiogram dataset. Diagrammatic representation of the FFRct analysis is provided in a separate PDF document in PACS. This dictation was created using the PDF document and an interactive 3D model of the results. 3D model is not available in the EMR/PACS. Normal FFR range is >0.80.  1. Left Main:  No significant stenosis.  FFRct 0.97  2. LAD: No significant stenosis. FFRct 0.93 proximal, 0.93 mid, 0.91 distal 3. LCX: No significant stenosis.  FFRct 0.97 proximal, 0.92 distal 4. RCA: No significant stenosis.  FFRct 0.99 proximal, 0.94 distal  IMPRESSION: 1.  CT FFR analysis didn't show any significant stenosis.  2. Recommend aggressive risk factor modification including high potency statin.  EKG: 12/09/2018 Rate 84 bpm Normal sinus rhythm  Nonspecific ST abnormality  Prolonged QT  CV: Myocardial Perfusion 08/19/2017  Nuclear stress EF: 60%. Mild inferoseptal wall hypokinesis  There was no ST segment deviation noted during stress.  This is a low risk study. No ischemia identified.  The study is normal.    Echo 09/27/2016 Study Conclusions  - Left ventricle: The cavity size was normal. Wall thickness was   normal. Systolic function was normal. The estimated ejection   fraction was in the range of 55% to 60%. Wall motion was normal;   there were no regional wall motion abnormalities. Doppler   parameters are consistent with abnormal left ventricular   relaxation (grade 1 diastolic  dysfunction). - Mitral valve: Calcified annulus.  Impressions:  - Normal LV systolic function; mild diastolic dysfunction. Past Medical History:  Diagnosis Date  . Acute respiratory failure with hypoxia ([place])   . Alcoholism and drug addiction in family   . Anxiety   . Arthritis    "knees, hips, lower back" (09/25/2016)  . Asthma   . Bipolar disorder ([place])   . Chronic bronchitis ([place])    "all the time" (09/25/2016)  . Chronic leg pain    RLE  . Chronic lower back pain   . COPD (chronic obstructive pulmonary disease) ([place])   . Depression   . Diabetes mellitus without complication ([place])   . Emphysema of lung ([place])   . GERD (gastroesophageal reflux disease)   . Headache   . Hepatitis C    "treated back in 2001-2002"  . High cholesterol   . Hypertension   . Hypothyroidism   . Incarcerated ventral hernia 04/04/2017  . On home oxygen therapy    "2L; at nighttime" (04/04/2017)  . OSA on CPAP    CPAP with Oxygen 2 liters  . Pneumonia    "all the time" (09/25/2016)  . Positive PPD    with negative chest x ray  . Tachycardia    patient reports with exertion ; denies any other conditions   . Thyroid disease     Past Surgical History:  Procedure Laterality Date  . BACK SURGERY    . BIOPSY  11/24/2018   Procedure: BIOPSY;  Surgeon: Thornton Park, MD;  [facility];  Service: Gastroenterology;;  . CARDIAC CATHETERIZATION    . COLONOSCOPY    . COLONOSCOPY WITH PROPOFOL N/A 11/24/2018   Procedure: COLONOSCOPY WITH PROPOFOL;  Surgeon: Thornton Park, MD;  [facility];  Service: Gastroenterology;  Laterality: N/A;  . CYST EXCISION     removal of cyst in sinuses  . DILATION AND CURETTAGE OF UTERUS    . ESOPHAGOGASTRODUODENOSCOPY (EGD) WITH PROPOFOL N/A 11/24/2018   Procedure: ESOPHAGOGASTRODUODENOSCOPY (EGD) WITH PROPOFOL;  Surgeon: Thornton Park, MD;  [facility];  Service: Gastroenterology;  Laterality: N/A;  . FOOT SURGERY Bilateral    bone  removed from 4th and 5 th toes and put in pin then took pin out  . HERNIA REPAIR    . INCISION AND DRAINAGE  ~ 2014/2015   "removed mesh & infection"  . INCISIONAL HERNIA REPAIR N/A 04/04/2017   Procedure: LAPAROSCOPIC INCISIONAL HERNIA REPAIR WITH MESH;  Surgeon: Fanny Skates, MD;  Location: [place];  Service: General;  Laterality: N/A;  . LACERATION REPAIR Right    repair wrist artery from laceration from ice skate  . LAPAROSCOPIC INCISIONAL / UMBILICAL / VENTRAL HERNIA REPAIR  04/04/2017   LAPAROSCOPIC INCISIONAL HERNIA REPAIR WITH MESH  . LIPOMA EXCISION Right    fattty lipoma in neck  . NASAL SEPTUM SURGERY    . POSTERIOR LUMBAR FUSION  2015/2016 X 2   "added rods and screws"  .  TOTAL HIP ARTHROPLASTY Right 02/05/2017   Procedure: RIGHT TOTAL HIP ARTHROPLASTY;  Surgeon: [name], MD;  [facility];  Service: Orthopedics;  Laterality: Right;  . TOTAL HIP ARTHROPLASTY Left 06/18/2017   Procedure: LEFT TOTAL HIP ARTHROPLASTY;  Surgeon: [name], MD;  [facility];  Service: Orthopedics;  Laterality: Left;  . TUBAL LIGATION    . UMBILICAL HERNIA REPAIR  ~ 2013   w/mesh  . UPPER GI ENDOSCOPY      MEDICATIONS: . acyclovir (ZOVIRAX) 400 MG tablet  . albuterol (PROVENTIL) (2.5 MG/3ML) 0.083% nebulizer solution  . atenolol (TENORMIN) 25 MG tablet  . atorvastatin (LIPITOR) 20 MG tablet  . budesonide-formoterol (SYMBICORT) 160-4.5 MCG/ACT inhaler  . buPROPion (WELLBUTRIN XL) 150 MG 24 hr tablet  . cyclobenzaprine (FLEXERIL) 10 MG tablet  . diphenhydrAMINE (BENADRYL) 25 MG tablet  . docusate sodium (COLACE) 100 MG capsule  . gabapentin (NEURONTIN) 300 MG capsule  . hydrochlorothiazide (HYDRODIURIL) 25 MG tablet  . Ipratropium-Albuterol (COMBIVENT RESPIMAT) 20-100 MCG/ACT AERS respimat  . levothyroxine (SYNTHROID, LEVOTHROID) 25 MCG tablet  . linaclotide (LINZESS) 145 MCG CAPS capsule  . Liniments (SALONPAS ARTHRITIS PAIN RELIEF EX)  . metoprolol tartrate  (LOPRESSOR) 100 MG tablet  . montelukast (SINGULAIR) 10 MG tablet  . Multiple Vitamins-Minerals (WOMENS 50+ MULTI VITAMIN/MIN PO)  . naproxen (NAPROSYN) 500 MG tablet  . nicotine (NICODERM CQ) 14 mg/24hr patch  . nystatin-triamcinolone ointment (MYCOLOG)  . OXYGEN  . pantoprazole (PROTONIX) 40 MG tablet  . predniSONE (DELTASONE) 50 MG tablet  . roflumilast (DALIRESP) 500 MCG TABS tablet  . traZODone (DESYREL) 100 MG tablet  . varenicline (CHANTIX STARTING MONTH PAK) 0.5 MG X 11 & 1 MG X 42 tablet  . venlafaxine XR (EFFEXOR-XR) 150 MG 24 hr capsule   No current facility-administered medications for this encounter.     Konrad Felix, PA-C [facility] [phone] 01/22/19  2:00 PM

## 2019-01-25 ENCOUNTER — Telehealth: Payer: Self-pay | Admitting: Pharmacist

## 2019-01-25 ENCOUNTER — Other Ambulatory Visit (HOSPITAL_COMMUNITY): Payer: Medicare Other

## 2019-01-25 MED ORDER — GENTAMICIN SULFATE 40 MG/ML IJ SOLN
5.0000 mg/kg | INTRAVENOUS | Status: DC
  Administered 2019-01-26: 372.4 mg via INTRAVENOUS
  Filled 2019-01-25: qty 9.25

## 2019-01-25 NOTE — Telephone Encounter (Signed)
Called pt on 01/25/2019 at 10:27AM and left HIPAA-compliant VM with instructions to call [place] [facility] back   Plan to discuss smoking cessation and patient assistance programs  Drexel Iha, PharmD PGY2 Ambulatory Care Pharmacy Resident

## 2019-01-25 NOTE — Telephone Encounter (Signed)
Re-attempted to call pt on 01/25/2019 at 3:58PM and left HIPAA-compliant VM with instructions to call [place] [facility] back   Plan to discuss smoking cessation and re-applying to patient assistance programs  Drexel Iha, PharmD PGY2 Ambulatory Care Pharmacy Resident

## 2019-01-25 NOTE — Telephone Encounter (Signed)
Mailed pt patient assistance program documentation to provide signature and mail back.  Specifically, for [place]&me (symbicort, roflumilast) and BICares(combivent respimat).  Will provide additional verbal instructions once pt returns call to [place] clinic.

## 2019-01-25 NOTE — Telephone Encounter (Signed)
Called pt on 01/25/2019 at 4:21 PM. Patient states she has smoked 1 cigarette about 2.5 weeks ago since quitting. She states she felt like she smoked an entire pack when she "cheated" and smoked 1 cigarette. Since this she has smoked 0 cigarettes. She states she does not have an urge to smoke anymore. She is planning on changing to nicotine 14 mg patch as soon as she completes nicotine 21 mg daily patch. She feels that she is ready to transition to 14 mg patch. She states she is planning on calling [place] today to obtain 7 mg nicotine patches.  Also, informed pt that patient assistance program documentation was mailed patient to provide signature and mail back. Specifically, for AZ&me (symbicort, roflumilast) and BICares(combivent respimat). Pt verbalized understanding.  Age when started using tobacco on a daily basis: 58 years old (would smoke 2-3 packs per day) Preferred cigarette brand: Marlboro lights then switched to [place] Tobacco use: smoked 1 cigarette about 2.5 weeks ago; now smoking none Triggers:stress, specifically if she hears bad news, is in a fight with a family member, or if she is bored.  Current smoking cessation agents: chantix 1 mg twice daily, nicotine patch 21 mg daily (started 12/28/2018; last day 01/25/2019) Prior smoking cessation agents: gum or lozenge(caused her throat to burn and were painful) Non-pharmacologic methods: organizing room,cuddle with her brother's cat Elyse Hsu) or dog Berle Mull) when she gets an urge to smoke,praying, eatinga lollipop, calls [place]   Motivation to quit: health, recovery from surgery, breathe easier Rates IMPORTANCE of quitting tobacco on 1-10 scale of 10 Rates CONFIDENCE of quitting tobacco on 1-10 scale of 10 Goal: no cigarette use   Plan:  1. Smoking Cessation: continue chantix 1 mg twice daily, taper dose from nicotine patch 21 mg to nicotine patch 14 mg daily x 2 weeks. Then taper dose down to nicotine patch 7 mg daily x  2 weeks 2. Patient assistance program: Will await patient to send in signed patient assistance program applications.  Follow up: 02/22/2019

## 2019-01-26 ENCOUNTER — Inpatient Hospital Stay (HOSPITAL_COMMUNITY)
Admission: RE | Admit: 2019-01-26 | Discharge: 2019-01-29 | DRG: 468 | Disposition: A | Discharge status: Home / Self Care | Payer: Medicare Other | Attending: Orthopedic Surgery | Admitting: Orthopedic Surgery

## 2019-01-26 ENCOUNTER — Encounter (HOSPITAL_COMMUNITY)
Admission: RE | Disposition: A | Discharge status: Home / Self Care | Payer: Self-pay | Source: Home / Self Care | Attending: Orthopedic Surgery

## 2019-01-26 ENCOUNTER — Inpatient Hospital Stay (HOSPITAL_COMMUNITY): Payer: Medicare Other

## 2019-01-26 ENCOUNTER — Inpatient Hospital Stay (HOSPITAL_COMMUNITY): Payer: Medicare Other | Admitting: Certified Registered Nurse Anesthetist

## 2019-01-26 ENCOUNTER — Other Ambulatory Visit: Payer: Self-pay

## 2019-01-26 ENCOUNTER — Inpatient Hospital Stay (HOSPITAL_COMMUNITY): Payer: Medicare Other | Admitting: Physician Assistant

## 2019-01-26 ENCOUNTER — Encounter (HOSPITAL_COMMUNITY): Payer: Self-pay | Admitting: *Deleted

## 2019-01-26 DIAGNOSIS — Z8249 Family history of ischemic heart disease and other diseases of the circulatory system: Secondary | ICD-10-CM | POA: Diagnosis not present

## 2019-01-26 DIAGNOSIS — Z20828 Contact with and (suspected) exposure to other viral communicable diseases: Secondary | ICD-10-CM | POA: Diagnosis present

## 2019-01-26 DIAGNOSIS — E669 Obesity, unspecified: Secondary | ICD-10-CM | POA: Diagnosis present

## 2019-01-26 DIAGNOSIS — E7800 Pure hypercholesterolemia, unspecified: Secondary | ICD-10-CM | POA: Diagnosis not present

## 2019-01-26 DIAGNOSIS — M21752 Unequal limb length (acquired), left femur: Secondary | ICD-10-CM | POA: Diagnosis not present

## 2019-01-26 DIAGNOSIS — Z6830 Body mass index (BMI) 30.0-30.9, adult: Secondary | ICD-10-CM

## 2019-01-26 DIAGNOSIS — Z96649 Presence of unspecified artificial hip joint: Secondary | ICD-10-CM

## 2019-01-26 DIAGNOSIS — Z96643 Presence of artificial hip joint, bilateral: Secondary | ICD-10-CM | POA: Diagnosis present

## 2019-01-26 DIAGNOSIS — Y792 Prosthetic and other implants, materials and accessory orthopedic devices associated with adverse incidents: Secondary | ICD-10-CM | POA: Diagnosis present

## 2019-01-26 DIAGNOSIS — Z471 Aftercare following joint replacement surgery: Secondary | ICD-10-CM | POA: Diagnosis not present

## 2019-01-26 DIAGNOSIS — E039 Hypothyroidism, unspecified: Secondary | ICD-10-CM | POA: Diagnosis not present

## 2019-01-26 DIAGNOSIS — J029 Acute pharyngitis, unspecified: Secondary | ICD-10-CM | POA: Diagnosis not present

## 2019-01-26 DIAGNOSIS — F319 Bipolar disorder, unspecified: Secondary | ICD-10-CM | POA: Diagnosis present

## 2019-01-26 DIAGNOSIS — E119 Type 2 diabetes mellitus without complications: Secondary | ICD-10-CM | POA: Diagnosis not present

## 2019-01-26 DIAGNOSIS — Z87891 Personal history of nicotine dependence: Secondary | ICD-10-CM

## 2019-01-26 DIAGNOSIS — J439 Emphysema, unspecified: Secondary | ICD-10-CM | POA: Diagnosis not present

## 2019-01-26 DIAGNOSIS — K219 Gastro-esophageal reflux disease without esophagitis: Secondary | ICD-10-CM | POA: Diagnosis not present

## 2019-01-26 DIAGNOSIS — T84091A Other mechanical complication of internal left hip prosthesis, initial encounter: Secondary | ICD-10-CM | POA: Diagnosis not present

## 2019-01-26 DIAGNOSIS — Z833 Family history of diabetes mellitus: Secondary | ICD-10-CM | POA: Diagnosis not present

## 2019-01-26 DIAGNOSIS — M545 Low back pain: Secondary | ICD-10-CM | POA: Diagnosis not present

## 2019-01-26 DIAGNOSIS — E785 Hyperlipidemia, unspecified: Secondary | ICD-10-CM | POA: Diagnosis not present

## 2019-01-26 DIAGNOSIS — I10 Essential (primary) hypertension: Secondary | ICD-10-CM | POA: Diagnosis not present

## 2019-01-26 DIAGNOSIS — Z96642 Presence of left artificial hip joint: Secondary | ICD-10-CM | POA: Diagnosis not present

## 2019-01-26 HISTORY — PX: TOTAL HIP REVISION: SHX763

## 2019-01-26 LAB — GLUCOSE, CAPILLARY
Glucose-Capillary: 157 mg/dL — ABNORMAL HIGH (ref 70–99)
Glucose-Capillary: 157 mg/dL — ABNORMAL HIGH (ref 70–99)

## 2019-01-26 LAB — TYPE AND SCREEN
ABO/RH(D): B POS
Antibody Screen: NEGATIVE

## 2019-01-26 SURGERY — TOTAL HIP REVISION
Anesthesia: General | Site: Hip | Laterality: Left

## 2019-01-26 MED ORDER — POLYETHYLENE GLYCOL 3350 17 G PO PACK
17.0000 g | PACK | Freq: Two times a day (BID) | ORAL | Status: DC
  Administered 2019-01-26 – 2019-01-29 (×6): 17 g via ORAL
  Filled 2019-01-26 (×6): qty 1

## 2019-01-26 MED ORDER — MEPERIDINE HCL 50 MG/ML IJ SOLN: 6.2500 mg | INTRAMUSCULAR | Status: DC | PRN

## 2019-01-26 MED ORDER — SODIUM CHLORIDE 0.9 % IR SOLN
Status: DC | PRN
  Administered 2019-01-26: 1000 mL

## 2019-01-26 MED ORDER — LACTATED RINGERS IV SOLN
INTRAVENOUS | Status: DC
  Administered 2019-01-26: 12:00:00 via INTRAVENOUS

## 2019-01-26 MED ORDER — HYDROCODONE-ACETAMINOPHEN 5-325 MG PO TABS
1.0000 | ORAL_TABLET | ORAL | Status: DC | PRN
  Administered 2019-01-26 – 2019-01-28 (×2): 2 via ORAL
  Filled 2019-01-26 (×2): qty 2

## 2019-01-26 MED ORDER — ONDANSETRON HCL 4 MG/2ML IJ SOLN
INTRAMUSCULAR | Status: AC
  Filled 2019-01-26: qty 2

## 2019-01-26 MED ORDER — ONDANSETRON HCL 4 MG/2ML IJ SOLN
INTRAMUSCULAR | Status: DC | PRN
  Administered 2019-01-26: 4 mg via INTRAVENOUS

## 2019-01-26 MED ORDER — PHENOL 1.4 % MT LIQD: 1.0000 | OROMUCOSAL | Status: DC | PRN

## 2019-01-26 MED ORDER — HYDROCODONE-ACETAMINOPHEN 7.5-325 MG PO TABS
1.0000 | ORAL_TABLET | ORAL | Status: DC | PRN
  Administered 2019-01-26 – 2019-01-27 (×3): 2 via ORAL
  Administered 2019-01-27: 1 via ORAL
  Administered 2019-01-28 – 2019-01-29 (×4): 2 via ORAL
  Filled 2019-01-26 (×9): qty 2

## 2019-01-26 MED ORDER — DOCUSATE SODIUM 100 MG PO CAPS
100.0000 mg | ORAL_CAPSULE | Freq: Two times a day (BID) | ORAL | Status: DC
  Administered 2019-01-26 – 2019-01-29 (×6): 100 mg via ORAL
  Filled 2019-01-26 (×6): qty 1

## 2019-01-26 MED ORDER — FERROUS SULFATE 325 (65 FE) MG PO TABS
325.0000 mg | ORAL_TABLET | Freq: Three times a day (TID) | ORAL | Status: DC
  Administered 2019-01-27 – 2019-01-29 (×3): 325 mg via ORAL
  Filled 2019-01-26 (×3): qty 1

## 2019-01-26 MED ORDER — ATORVASTATIN CALCIUM 20 MG PO TABS
20.0000 mg | ORAL_TABLET | Freq: Every day | ORAL | Status: DC
  Administered 2019-01-27 – 2019-01-29 (×3): 20 mg via ORAL
  Filled 2019-01-26 (×3): qty 1

## 2019-01-26 MED ORDER — EPHEDRINE 5 MG/ML INJ
INTRAVENOUS | Status: AC
  Filled 2019-01-26: qty 10

## 2019-01-26 MED ORDER — MAGNESIUM CITRATE PO SOLN: 1.0000 | Freq: Once | ORAL | Status: DC | PRN

## 2019-01-26 MED ORDER — SUGAMMADEX SODIUM 500 MG/5ML IV SOLN
INTRAVENOUS | Status: DC | PRN
  Administered 2019-01-26: 200 mg via INTRAVENOUS

## 2019-01-26 MED ORDER — FENTANYL CITRATE (PF) 100 MCG/2ML IJ SOLN
25.0000 ug | INTRAMUSCULAR | Status: DC | PRN
  Administered 2019-01-26 (×3): 50 ug via INTRAVENOUS

## 2019-01-26 MED ORDER — MIDAZOLAM HCL 5 MG/5ML IJ SOLN
INTRAMUSCULAR | Status: DC | PRN
  Administered 2019-01-26 (×2): 1 mg via INTRAVENOUS

## 2019-01-26 MED ORDER — DEXAMETHASONE SODIUM PHOSPHATE 10 MG/ML IJ SOLN
INTRAMUSCULAR | Status: DC | PRN
  Administered 2019-01-26: 10 mg via INTRAVENOUS

## 2019-01-26 MED ORDER — SCOPOLAMINE 1 MG/3DAYS TD PT72
1.0000 | MEDICATED_PATCH | Freq: Once | TRANSDERMAL | Status: AC
  Administered 2019-01-26: 1 via TRANSDERMAL
  Filled 2019-01-26: qty 1

## 2019-01-26 MED ORDER — IPRATROPIUM-ALBUTEROL 0.5-2.5 (3) MG/3ML IN SOLN
3.0000 mL | Freq: Four times a day (QID) | RESPIRATORY_TRACT | Status: DC | PRN
  Administered 2019-01-27 – 2019-01-29 (×2): 3 mL via RESPIRATORY_TRACT
  Filled 2019-01-26 (×2): qty 3

## 2019-01-26 MED ORDER — ACYCLOVIR 400 MG PO TABS
400.0000 mg | ORAL_TABLET | Freq: Every day | ORAL | Status: DC
  Administered 2019-01-26 – 2019-01-29 (×4): 400 mg via ORAL
  Filled 2019-01-26 (×4): qty 1

## 2019-01-26 MED ORDER — TRANEXAMIC ACID-NACL 1000-0.7 MG/100ML-% IV SOLN
1000.0000 mg | Freq: Once | INTRAVENOUS | Status: AC
  Administered 2019-01-26: 1000 mg via INTRAVENOUS
  Filled 2019-01-26: qty 100

## 2019-01-26 MED ORDER — BUPROPION HCL ER (XL) 300 MG PO TB24
300.0000 mg | ORAL_TABLET | Freq: Every day | ORAL | Status: DC
  Administered 2019-01-27 – 2019-01-29 (×3): 300 mg via ORAL
  Filled 2019-01-26 (×3): qty 1

## 2019-01-26 MED ORDER — LEVOTHYROXINE SODIUM 25 MCG PO TABS
25.0000 ug | ORAL_TABLET | Freq: Every day | ORAL | Status: DC
  Administered 2019-01-27 – 2019-01-29 (×3): 25 ug via ORAL
  Filled 2019-01-26 (×3): qty 1

## 2019-01-26 MED ORDER — FENTANYL CITRATE (PF) 100 MCG/2ML IJ SOLN
INTRAMUSCULAR | Status: AC
  Filled 2019-01-26: qty 2

## 2019-01-26 MED ORDER — MONTELUKAST SODIUM 10 MG PO TABS
10.0000 mg | ORAL_TABLET | Freq: Every day | ORAL | Status: DC
  Administered 2019-01-26 – 2019-01-28 (×3): 10 mg via ORAL
  Filled 2019-01-26 (×3): qty 1

## 2019-01-26 MED ORDER — LIDOCAINE 2% (20 MG/ML) 5 ML SYRINGE
INTRAMUSCULAR | Status: AC
  Filled 2019-01-26: qty 5

## 2019-01-26 MED ORDER — PANTOPRAZOLE SODIUM 40 MG PO TBEC
40.0000 mg | DELAYED_RELEASE_TABLET | Freq: Two times a day (BID) | ORAL | Status: DC
  Administered 2019-01-26 – 2019-01-29 (×6): 40 mg via ORAL
  Filled 2019-01-26 (×6): qty 1

## 2019-01-26 MED ORDER — ROFLUMILAST 500 MCG PO TABS
500.0000 ug | ORAL_TABLET | Freq: Every day | ORAL | Status: DC
  Administered 2019-01-26 – 2019-01-29 (×4): 500 ug via ORAL
  Filled 2019-01-26 (×4): qty 1

## 2019-01-26 MED ORDER — ROCURONIUM BROMIDE 50 MG/5ML IV SOSY
PREFILLED_SYRINGE | INTRAVENOUS | Status: DC | PRN
  Administered 2019-01-26: 60 mg via INTRAVENOUS

## 2019-01-26 MED ORDER — SODIUM CHLORIDE 0.9 % IV SOLN
INTRAVENOUS | Status: DC
  Administered 2019-01-26 – 2019-01-27 (×2): via INTRAVENOUS

## 2019-01-26 MED ORDER — LIDOCAINE 2% (20 MG/ML) 5 ML SYRINGE
INTRAMUSCULAR | Status: DC | PRN
  Administered 2019-01-26: 40 mg via INTRAVENOUS

## 2019-01-26 MED ORDER — RIVAROXABAN 10 MG PO TABS
10.0000 mg | ORAL_TABLET | ORAL | Status: DC
  Administered 2019-01-27 – 2019-01-29 (×3): 10 mg via ORAL
  Filled 2019-01-26 (×3): qty 1

## 2019-01-26 MED ORDER — ATENOLOL 25 MG PO TABS
25.0000 mg | ORAL_TABLET | Freq: Every day | ORAL | Status: DC
  Administered 2019-01-27 – 2019-01-29 (×3): 25 mg via ORAL
  Filled 2019-01-26 (×3): qty 1

## 2019-01-26 MED ORDER — NICOTINE 14 MG/24HR TD PT24
14.0000 mg | MEDICATED_PATCH | Freq: Every day | TRANSDERMAL | Status: DC
  Administered 2019-01-27 – 2019-01-28 (×2): 14 mg via TRANSDERMAL
  Filled 2019-01-26 (×3): qty 1

## 2019-01-26 MED ORDER — METHOCARBAMOL 500 MG IVPB - SIMPLE MED
500.0000 mg | Freq: Four times a day (QID) | INTRAVENOUS | Status: DC | PRN
  Administered 2019-01-26: 500 mg via INTRAVENOUS
  Filled 2019-01-26: qty 50

## 2019-01-26 MED ORDER — SUGAMMADEX SODIUM 500 MG/5ML IV SOLN
INTRAVENOUS | Status: AC
  Filled 2019-01-26: qty 5

## 2019-01-26 MED ORDER — HYDROCHLOROTHIAZIDE 25 MG PO TABS
25.0000 mg | ORAL_TABLET | Freq: Every day | ORAL | Status: DC
  Administered 2019-01-27 – 2019-01-29 (×3): 25 mg via ORAL
  Filled 2019-01-26 (×3): qty 1

## 2019-01-26 MED ORDER — FENTANYL CITRATE (PF) 100 MCG/2ML IJ SOLN
INTRAMUSCULAR | Status: AC
  Filled 2019-01-26: qty 4

## 2019-01-26 MED ORDER — MENTHOL 3 MG MT LOZG: 1.0000 | LOZENGE | OROMUCOSAL | Status: DC | PRN

## 2019-01-26 MED ORDER — METHOCARBAMOL 500 MG PO TABS
500.0000 mg | ORAL_TABLET | Freq: Four times a day (QID) | ORAL | Status: DC | PRN
  Administered 2019-01-26 – 2019-01-28 (×6): 500 mg via ORAL
  Filled 2019-01-26 (×7): qty 1

## 2019-01-26 MED ORDER — MIDAZOLAM HCL 2 MG/2ML IJ SOLN: 0.5000 mg | Freq: Once | INTRAMUSCULAR | Status: DC | PRN

## 2019-01-26 MED ORDER — FENTANYL CITRATE (PF) 100 MCG/2ML IJ SOLN
INTRAMUSCULAR | Status: DC | PRN
  Administered 2019-01-26 (×2): 50 ug via INTRAVENOUS

## 2019-01-26 MED ORDER — METOCLOPRAMIDE HCL 5 MG/ML IJ SOLN: 5.0000 mg | Freq: Three times a day (TID) | INTRAMUSCULAR | Status: DC | PRN

## 2019-01-26 MED ORDER — EPHEDRINE SULFATE-NACL 50-0.9 MG/10ML-% IV SOSY
PREFILLED_SYRINGE | INTRAVENOUS | Status: DC | PRN
  Administered 2019-01-26 (×3): 10 mg via INTRAVENOUS

## 2019-01-26 MED ORDER — ALBUTEROL SULFATE HFA 108 (90 BASE) MCG/ACT IN AERS
INHALATION_SPRAY | RESPIRATORY_TRACT | Status: AC
  Filled 2019-01-26: qty 6.7

## 2019-01-26 MED ORDER — DIPHENHYDRAMINE HCL 12.5 MG/5ML PO ELIX
12.5000 mg | ORAL_SOLUTION | ORAL | Status: DC | PRN
  Administered 2019-01-26 – 2019-01-27 (×2): 25 mg via ORAL
  Filled 2019-01-26 (×2): qty 10

## 2019-01-26 MED ORDER — MIDAZOLAM HCL 2 MG/2ML IJ SOLN
INTRAMUSCULAR | Status: AC
  Filled 2019-01-26: qty 2

## 2019-01-26 MED ORDER — BISACODYL 10 MG RE SUPP
10.0000 mg | Freq: Every day | RECTAL | Status: DC | PRN
  Administered 2019-01-28: 10 mg via RECTAL
  Filled 2019-01-26: qty 1

## 2019-01-26 MED ORDER — ROCURONIUM BROMIDE 10 MG/ML (PF) SYRINGE
PREFILLED_SYRINGE | INTRAVENOUS | Status: AC
  Filled 2019-01-26: qty 10

## 2019-01-26 MED ORDER — PROMETHAZINE HCL 25 MG/ML IJ SOLN: 6.2500 mg | INTRAMUSCULAR | Status: DC | PRN

## 2019-01-26 MED ORDER — KETAMINE HCL 10 MG/ML IJ SOLN
INTRAMUSCULAR | Status: AC
  Filled 2019-01-26: qty 1

## 2019-01-26 MED ORDER — VARENICLINE TARTRATE 0.5 MG PO TABS
0.5000 mg | ORAL_TABLET | Freq: Two times a day (BID) | ORAL | Status: DC
  Administered 2019-01-26 – 2019-01-29 (×6): 0.5 mg via ORAL
  Filled 2019-01-26 (×7): qty 1

## 2019-01-26 MED ORDER — PROPOFOL 10 MG/ML IV BOLUS
INTRAVENOUS | Status: DC | PRN
  Administered 2019-01-26: 150 mg via INTRAVENOUS

## 2019-01-26 MED ORDER — VANCOMYCIN HCL IN DEXTROSE 1-5 GM/200ML-% IV SOLN
INTRAVENOUS | Status: AC
  Filled 2019-01-26: qty 200

## 2019-01-26 MED ORDER — ALBUTEROL SULFATE HFA 108 (90 BASE) MCG/ACT IN AERS
INHALATION_SPRAY | RESPIRATORY_TRACT | Status: DC | PRN
  Administered 2019-01-26: 2 via RESPIRATORY_TRACT

## 2019-01-26 MED ORDER — ONDANSETRON HCL 4 MG PO TABS: 4.0000 mg | ORAL_TABLET | Freq: Four times a day (QID) | ORAL | Status: DC | PRN

## 2019-01-26 MED ORDER — VANCOMYCIN HCL IN DEXTROSE 1-5 GM/200ML-% IV SOLN
1000.0000 mg | INTRAVENOUS | Status: AC
  Administered 2019-01-26: 1000 mg via INTRAVENOUS
  Filled 2019-01-26: qty 200

## 2019-01-26 MED ORDER — MOMETASONE FURO-FORMOTEROL FUM 200-5 MCG/ACT IN AERO
2.0000 | INHALATION_SPRAY | Freq: Two times a day (BID) | RESPIRATORY_TRACT | Status: DC
  Administered 2019-01-26 – 2019-01-29 (×6): 2 via RESPIRATORY_TRACT
  Filled 2019-01-26: qty 8.8

## 2019-01-26 MED ORDER — DEXAMETHASONE SODIUM PHOSPHATE 10 MG/ML IJ SOLN
INTRAMUSCULAR | Status: AC
  Filled 2019-01-26: qty 1

## 2019-01-26 MED ORDER — VENLAFAXINE HCL ER 150 MG PO CP24
150.0000 mg | ORAL_CAPSULE | Freq: Every day | ORAL | Status: DC
  Administered 2019-01-27 – 2019-01-29 (×3): 150 mg via ORAL
  Filled 2019-01-26 (×3): qty 1

## 2019-01-26 MED ORDER — IPRATROPIUM-ALBUTEROL 20-100 MCG/ACT IN AERS: 1.0000 | INHALATION_SPRAY | Freq: Four times a day (QID) | RESPIRATORY_TRACT | Status: DC | PRN

## 2019-01-26 MED ORDER — STERILE WATER FOR IRRIGATION IR SOLN
Status: DC | PRN
  Administered 2019-01-26 (×2): 1000 mL

## 2019-01-26 MED ORDER — FENTANYL CITRATE (PF) 100 MCG/2ML IJ SOLN
25.0000 ug | INTRAMUSCULAR | Status: DC | PRN
  Administered 2019-01-26: 25 ug via INTRAVENOUS
  Filled 2019-01-26: qty 2

## 2019-01-26 MED ORDER — METOCLOPRAMIDE HCL 5 MG PO TABS: 5.0000 mg | ORAL_TABLET | Freq: Three times a day (TID) | ORAL | Status: DC | PRN

## 2019-01-26 MED ORDER — TRANEXAMIC ACID-NACL 1000-0.7 MG/100ML-% IV SOLN
1000.0000 mg | INTRAVENOUS | Status: AC
  Administered 2019-01-26: 1000 mg via INTRAVENOUS
  Filled 2019-01-26: qty 100

## 2019-01-26 MED ORDER — ALUM & MAG HYDROXIDE-SIMETH 200-200-20 MG/5ML PO SUSP: 15.0000 mL | ORAL | Status: DC | PRN

## 2019-01-26 MED ORDER — CHLORHEXIDINE GLUCONATE 4 % EX LIQD
60.0000 mL | Freq: Once | CUTANEOUS | Status: AC
  Administered 2019-01-26: 4 via TOPICAL

## 2019-01-26 MED ORDER — METHOCARBAMOL 500 MG IVPB - SIMPLE MED
INTRAVENOUS | Status: AC
  Filled 2019-01-26: qty 50

## 2019-01-26 MED ORDER — GABAPENTIN 300 MG PO CAPS
600.0000 mg | ORAL_CAPSULE | Freq: Every day | ORAL | Status: DC
  Administered 2019-01-26 – 2019-01-28 (×3): 600 mg via ORAL
  Filled 2019-01-26 (×3): qty 2

## 2019-01-26 MED ORDER — VANCOMYCIN HCL IN DEXTROSE 1-5 GM/200ML-% IV SOLN
1000.0000 mg | Freq: Two times a day (BID) | INTRAVENOUS | Status: AC
  Administered 2019-01-27: 1000 mg via INTRAVENOUS
  Filled 2019-01-26 (×2): qty 200

## 2019-01-26 MED ORDER — TRAZODONE HCL 100 MG PO TABS
100.0000 mg | ORAL_TABLET | Freq: Every day | ORAL | Status: DC
  Administered 2019-01-26 – 2019-01-28 (×3): 100 mg via ORAL
  Filled 2019-01-26 (×3): qty 1

## 2019-01-26 MED ORDER — ACETAMINOPHEN 325 MG PO TABS: 325.0000 mg | ORAL_TABLET | Freq: Four times a day (QID) | ORAL | Status: DC | PRN

## 2019-01-26 MED ORDER — KETAMINE HCL 10 MG/ML IJ SOLN
INTRAMUSCULAR | Status: DC | PRN
  Administered 2019-01-26: 30 mg via INTRAVENOUS

## 2019-01-26 MED ORDER — ONDANSETRON HCL 4 MG/2ML IJ SOLN
4.0000 mg | Freq: Four times a day (QID) | INTRAMUSCULAR | Status: DC | PRN
  Administered 2019-01-27: 4 mg via INTRAVENOUS
  Filled 2019-01-26: qty 2

## 2019-01-26 MED ORDER — ALBUTEROL SULFATE (2.5 MG/3ML) 0.083% IN NEBU: 2.5000 mg | INHALATION_SOLUTION | Freq: Two times a day (BID) | RESPIRATORY_TRACT | Status: DC | PRN

## 2019-01-26 SURGICAL SUPPLY — 39 items
BAG ZIPLOCK 12X15 (MISCELLANEOUS) IMPLANT
BLADE SAW SGTL 11.0X1.19X90.0M (BLADE) IMPLANT
CLOTH BEACON ORANGE TIMEOUT ST (SAFETY) ×2 IMPLANT
COVER PERINEAL POST (MISCELLANEOUS) ×2 IMPLANT
COVER SURGICAL LIGHT HANDLE (MISCELLANEOUS) ×2 IMPLANT
COVER WAND RF STERILE (DRAPES) IMPLANT
DERMABOND ADVANCED (GAUZE/BANDAGES/DRESSINGS) ×1
DERMABOND ADVANCED .7 DNX12 (GAUZE/BANDAGES/DRESSINGS) ×1 IMPLANT
DRAPE STERI IOBAN 125X83 (DRAPES) ×2 IMPLANT
DRAPE U-SHAPE 47X51 STRL (DRAPES) ×4 IMPLANT
DRESSING AQUACEL AG SP 3.5X10 (GAUZE/BANDAGES/DRESSINGS) ×1 IMPLANT
DRSG AQUACEL AG ADV 3.5X10 (GAUZE/BANDAGES/DRESSINGS) ×2 IMPLANT
DRSG AQUACEL AG SP 3.5X10 (GAUZE/BANDAGES/DRESSINGS) ×2
DURAPREP 26ML APPLICATOR (WOUND CARE) ×2 IMPLANT
ELECT REM PT RETURN 15FT ADLT (MISCELLANEOUS) ×2 IMPLANT
GLOVE BIO SURGEON STRL SZ 6 (GLOVE) ×4 IMPLANT
GLOVE BIOGEL PI IND STRL 6.5 (GLOVE) ×1 IMPLANT
GLOVE BIOGEL PI IND STRL 7.5 (GLOVE) ×1 IMPLANT
GLOVE BIOGEL PI IND STRL 8.5 (GLOVE) ×1 IMPLANT
GLOVE BIOGEL PI INDICATOR 6.5 (GLOVE) ×1
GLOVE BIOGEL PI INDICATOR 7.5 (GLOVE) ×1
GLOVE BIOGEL PI INDICATOR 8.5 (GLOVE) ×1
GLOVE ECLIPSE 8.0 STRL XLNG CF (GLOVE) ×2 IMPLANT
GLOVE ORTHO TXT STRL SZ7.5 (GLOVE) ×4 IMPLANT
GOWN STRL REUS W/TWL LRG LVL3 (GOWN DISPOSABLE) ×4 IMPLANT
GOWN STRL REUS W/TWL XL LVL3 (GOWN DISPOSABLE) ×2 IMPLANT
HEAD FEM STD 32X+13 STRL (Hips) ×2 IMPLANT
HOLDER FOLEY CATH W/STRAP (MISCELLANEOUS) ×2 IMPLANT
KIT TURNOVER KIT A (KITS) IMPLANT
PACK ANTERIOR HIP CUSTOM (KITS) ×2 IMPLANT
PENCIL SMOKE EVACUATOR (MISCELLANEOUS) IMPLANT
SAW OSC TIP CART 19.5X105X1.3 (SAW) ×2 IMPLANT
SUT MNCRL AB 4-0 PS2 18 (SUTURE) ×2 IMPLANT
SUT VIC AB 1 CT1 36 (SUTURE) ×6 IMPLANT
SUT VIC AB 2-0 CT1 27 (SUTURE) ×2
SUT VIC AB 2-0 CT1 TAPERPNT 27 (SUTURE) ×2 IMPLANT
SUT VLOC 180 0 24IN GS25 (SUTURE) ×2 IMPLANT
TRAY FOLEY MTR SLVR 16FR STAT (SET/KITS/TRAYS/PACK) ×2 IMPLANT
WATER STERILE IRR 1000ML POUR (IV SOLUTION) ×2 IMPLANT

## 2019-01-26 NOTE — Anesthesia Preprocedure Evaluation (Addendum)
Anesthesia Evaluation  Patient identified by MRN, date of birth, ID band Patient awake    Reviewed: Allergy & Precautions, NPO status , Patient's Chart, lab work & pertinent test results, reviewed documented beta blocker date and time   History of Anesthesia Complications Negative for: history of anesthetic complications  Airway Mallampati: II  TM Distance: >3 FB Neck ROM: Full    Dental  (+) Missing, Dental Advisory Given, Poor Dentition   Pulmonary sleep apnea, Continuous Positive Airway Pressure Ventilation and Oxygen sleep apnea , COPD,  COPD inhaler and oxygen dependent, former smoker,  01/22/2019 SARS coronavirus NEG   breath sounds clear to auscultation       Cardiovascular hypertension, Pt. on medications and Pt. on home beta blockers (-) angina Rhythm:Regular Rate:Normal  '18 ECHO: EF 55-60%, valves OK   Neuro/Psych  Headaches, Anxiety Depression Bipolar Disorder    GI/Hepatic GERD  Medicated and Controlled,(+) Hepatitis - (remote history)  Endo/Other  diabetes (glu 157)Hypothyroidism obese  Renal/GU negative Renal ROS     Musculoskeletal  (+) Arthritis , Osteoarthritis,    Abdominal (+) + obese,   Peds  Hematology negative hematology ROS (+)   Anesthesia Other Findings   Reproductive/Obstetrics                            Anesthesia Physical Anesthesia Plan  ASA: III  Anesthesia Plan: General   Post-op Pain Management:    Induction: Intravenous  PONV Risk Score and Plan: 4 or greater and Scopolamine patch - Pre-op, Dexamethasone and Ondansetron  Airway Management Planned: Oral ETT  Additional Equipment: None  Intra-op Plan:   Post-operative Plan: Extubation in OR  Informed Consent: I have reviewed the patients History and Physical, chart, labs and discussed the procedure including the risks, benefits and alternatives for the proposed anesthesia with the patient or  authorized representative who has indicated his/her understanding and acceptance.     Dental advisory given  Plan Discussed with: CRNA and Surgeon  Anesthesia Plan Comments: (Pt declines SAB)       Anesthesia Quick Evaluation

## 2019-01-26 NOTE — Evaluation (Signed)
Physical Therapy Evaluation Patient Details Name: Joan [name] MRN: [id number] DOB: 1960/11/12 Today's Date: 01/26/2019   History of Present Illness  Patient is 58 y.o. female s/p Lt THR with PMH significant for HTN, hypothyroidism, GERD, COPD, DM, chronic pain, anxiety, depression, and OA.    Clinical Impression  Joan [name] is a 58 y.o. female POD 0 s/p Lt THR. Patient reports modified independence with mobility using SPC or RW at baseline. Patient is now limited by functional impairments (see PT problem list below) and requires min assist for transfers and gait with RW. Patient was able to ambulate ~30 feet with RW and min assist with repeated cues required to maintain posterior hip precautions. Patient will benefit from continued skilled PT interventions to address impairments and progress towards PLOF. Acute PT will follow to progress mobility and stair training in preparation for safe discharge home.    Follow Up Recommendations SNF;Supervision for mobility/OOB    Equipment Recommendations  None recommended by PT    Recommendations for Other Services       Precautions / Restrictions Precautions Precautions: Fall;Posterior Hip Precaution Booklet Issued: Yes (comment) Restrictions Weight Bearing Restrictions: No LLE Weight Bearing: Weight bearing as tolerated      Mobility  Bed Mobility Overal bed mobility: Needs Assistance Bed Mobility: Supine to Sit     Supine to sit: HOB elevated;Min assist;Mod assist     General bed mobility comments: multimodal cues required for safe sequencing to prevent hip felxion beyond 90* and prevent internal rotation of hip. patient required assist for LE mobiltiy and to raise trunk up safely.  Transfers Overall transfer level: Needs assistance Equipment used: Rolling walker (2 wheeled) Transfers: Sit to/from Stand Sit to Stand: Min assist;From elevated surface         General transfer comment: cues for safe hand placement and  technique from EOB and BSC. min assist required for power up and to steady with rising. cues required to deter hip/trunk flexion and to maintain posterior hip precautions.  Ambulation/Gait Ambulation/Gait assistance: Min assist Gait Distance (Feet): 30 Feet Assistive device: Rolling walker (2 wheeled) Gait Pattern/deviations: Step-to pattern;Decreased stride length;Decreased stance time - left;Decreased step length - right;Decreased weight shift to left Gait velocity: slow and labored   General Gait Details: cues requried for safe step sequencing and to deter internal rotation on Lt hip as pt had tendency to attempt to pivot when stepping with Rt foot. assist requried to steady and manage RW proximity safely.  Stairs            Wheelchair Mobility    Modified Rankin (Stroke Patients Only)       Balance Overall balance assessment: Needs assistance Sitting-balance support: No upper extremity supported;Feet supported Sitting balance-Leahy Scale: Fair     Standing balance support: During functional activity;Bilateral upper extremity supported Standing balance-Leahy Scale: Poor                Pertinent Vitals/Pain Pain Assessment: Faces Faces Pain Scale: Hurts even more Pain Location: left hip Pain Descriptors / Indicators: Grimacing;Guarding;Moaning Pain Intervention(s): Limited activity within patient's tolerance;Monitored during session;Repositioned    Home Living Family/patient expects to be discharged to:: Private residence Living Arrangements: Other relatives(lives with brother) Available Help at Discharge: Family Type of Home: House Home Access: Stairs to enter Entrance Stairs-Rails: Right Entrance Stairs-Number of Steps: 3 with rails and then 1 threshold Home Layout: One level Home Equipment: Walker - 2 wheels;Walker - 4 wheels;Cane - single point;Shower seat;Bedside commode  Prior Function Level of Independence: Independent with assistive device(s)          Comments: pt reports use of SPC for mobility and RW occasionally. She reporst some use of furniture to steady with walking at home. Performs own ADLs with increased time     Hand Dominance   Dominant Hand: Right    Extremity/Trunk Assessment   Upper Extremity Assessment Upper Extremity Assessment: Overall WFL for tasks assessed    Lower Extremity Assessment Lower Extremity Assessment: Generalized weakness;LLE deficits/detail LLE: Unable to fully assess due to pain LLE Sensation: WNL LLE Coordination: WNL    Cervical / Trunk Assessment Cervical / Trunk Assessment: Normal  Communication   Communication: No difficulties  Cognition Arousal/Alertness: Awake/alert Behavior During Therapy: Restless Overall Cognitive Status: No family/caregiver present to determine baseline cognitive functioning        General Comments: pt with poor ability to focus on tasks and required simple one step commands; pt was disjointed with conversation and required cues to focus on questions or tasks; pt requried frequent and repeated reminders for posterior hip precautions and has decreased safety awareness      General Comments      Exercises     Assessment/Plan    PT Assessment Patient needs continued PT services  PT Problem List Decreased strength;Decreased range of motion;Decreased activity tolerance;Decreased mobility;Decreased balance;Decreased knowledge of use of DME       PT Treatment Interventions DME instruction;Functional mobility training;Therapeutic activities;Gait training;Stair training;Therapeutic exercise;Balance training;Patient/family education;Modalities    PT Goals (Current goals can be found in the Care Plan section)  Acute Rehab PT Goals Patient Stated Goal: none stated by patient PT Goal Formulation: With patient Time For Goal Achievement: 02/02/19 Potential to Achieve Goals: Fair    Frequency 7X/week    AM-PAC PT "6 Clicks" Mobility  Outcome  Measure Help needed turning from your back to your side while in a flat bed without using bedrails?: A Little Help needed moving from lying on your back to sitting on the side of a flat bed without using bedrails?: A Little Help needed moving to and from a bed to a chair (including a wheelchair)?: A Little Help needed standing up from a chair using your arms (e.g., wheelchair or bedside chair)?: A Little Help needed to walk in hospital room?: A Little Help needed climbing 3-5 steps with a railing? : A Lot 6 Click Score: 17    End of Session Equipment Utilized During Treatment: Gait belt Activity Tolerance: Patient tolerated treatment well Patient left: in chair;with call bell/phone within reach;with chair alarm set Nurse Communication: Mobility status PT Visit Diagnosis: Muscle weakness (generalized) (M62.81);Difficulty in walking, not elsewhere classified (R26.2)    Time: [id number] PT Time Calculation (min) (ACUTE ONLY): 27 min   Charges:   PT Evaluation $PT Eval Low Complexity: 1 Low PT Treatments $Gait Training: 8-22 mins        Kipp Brood, PT, DPT Physical Therapist with [facility]  01/26/2019 7:34 PM

## 2019-01-26 NOTE — Anesthesia Postprocedure Evaluation (Signed)
Anesthesia Post Note  Patient: Joan [name]  Procedure(s) Performed: TOTAL HIP REVISION (Left Hip)     Patient location during evaluation: PACU Anesthesia Type: General Level of consciousness: awake and alert, patient cooperative and oriented Pain management: pain level controlled Vital Signs Assessment: post-procedure vital signs reviewed and stable Respiratory status: spontaneous breathing, nonlabored ventilation, respiratory function stable and patient connected to nasal cannula oxygen Cardiovascular status: blood pressure returned to baseline and stable Postop Assessment: no apparent nausea or vomiting Anesthetic complications: no    Last Vitals:  Vitals:   01/26/19 1630 01/26/19 1645  BP: 104/78 114/88  Pulse: 64 68  Resp: 11 19  Temp:    SpO2: 92% 92%    Last Pain:  Vitals:   01/26/19 1645  TempSrc:   PainSc: 5                  Kajol Crispen,E. Charmelle Soh

## 2019-01-26 NOTE — Op Note (Signed)
NAME: Joan [name], Joan [name] MEDICAL RECORD [id number] ACCOUNT [id number] DATE OF BIRTH:1960/10/25 FACILITY: [facility] LOCATION: [facility] PHYSICIAN:Grigor Lipschutz DCharlann Boxer, MD  OPERATIVE REPORT  DATE OF PROCEDURE:  01/26/2019  PREOPERATIVE DIAGNOSIS:  Left leg length discrepancy, status post bilateral total hip replacements with a shorter left lower extremity compared to the right affecting her lower back.  POSTOPERATIVE DIAGNOSIS:  Left leg length discrepancy, status post bilateral total hip replacements with a shorter left lower extremity compared to the right affecting her lower back.  PROCEDURE:  Revision left total hip replacement with a single component.  I exchanged a 32+5 ball to a 32+13 Articul/Eze metal ball.  SURGEON:  Durene Romans, MD  ASSISTANT:  Lanney Gins PA-C.  Note that Mr. Carmon Sails was present for the entirety of the case from preoperative positioning to perioperative management of the operative extremity, general facilitation of the case and primary wound closure.  ANESTHESIA:  General.  SPECIMENS:  None.  COMPLICATIONS:  None.  ESTIMATED BLOOD LOSS:  100 mL.  DRAINS:  None.  INDICATIONS:  The patient is a 58 year old female with history of bilateral total hip replacements.  Her left hip replacement was performed within the last 18 months.  She has noted problems and challenges with her gait associated with this, resulting in  low back pain and the need for wearing orthotics.  She presented for evaluation and consideration of revision of the component.  Upon review of her clinical and radiographic concerns, we discussed treatment options.  The least morbid procedure would be  to do a head exchange, which could provide leg length without significant complications associated with osteotomy and removing a well-fixed femoral component.  We discussed the risks of persistent leg length discrepancy versus dislocation, DVT, component  failure, need for future surgeries.  Consent was  obtained for benefit of improved pain relief and functionality.  DESCRIPTION OF PROCEDURE:  The patient was brought to the operative theater.  Once adequate anesthesia, preoperative antibiotics, vancomycin and gentamicin due to CEPHALOSPORIN ALLERGY, she was positioned into the right lateral decubitus position with  left side up.  Additionally, Decadron and tranexamic acid were administered.  The left hip was then prepped and draped in sterile fashion.  Her previous incision had been identified.  I used the posterior portion of this incision.  Soft tissue planes  were carried down to the iliotibial band and gluteal fascia.  She was noted to have a very whimsical gluteal fascia and significant fatty atrophy to the musculature consistent with disuse.  The iliotibial band and gluteal fascia were then opened for  posterior approach to the hip.  Once I got to the hip joint, we encountered clear synovial fluid, no signs for infection.  Following exposure of the posterior aspect of the hip, I evaluated her leg length in this lateral decubitus position, determining  where she currently was and in comparison to this once we trialed with head balls.  Following this, I dislocated her femoral head, removed the previously placed 32+5 ball.  I then trialed with a 32+13 ball, which was only going to be available as a  skirted metal component.  When I did this trial, the leg lengths appeared to be improved and the hip stability was great.  I did note that it was pretty easy to dislocate her left hip consistent with the shortening issues that she.  The +13 added length  as well as offset to provide tension on the weakened muscles.  Given these findings, I selected a 32+13  Articul/Eze metal ball.  It was impacted on a clean and dry trunnion and the hip was reduced.  The hip had been irrigated throughout the case.  Based  on exposure, there was no posterior capsule repair.  I then reapproximated the iliotibial band and  gluteal fascia using  #1 Vicryl and #1 Stratafix suture.  The remainder of the wound was closed with 2-0 Vicryl and a running Monocryl stitch.  The hip was  then cleaned, dried and dressed sterilely using surgical glue and Aquacel dressing.  The patient was brought to the recovery room in stable condition, tolerating the procedure well.  Findings were reviewed with her brother.  She will be admitted to the  hospital, will receive physical therapy and likely be discharged tomorrow depending on her pain relief.  VN/NUANCE  D:01/26/2019 T:01/26/2019 JOB:[id number]

## 2019-01-26 NOTE — Interval H&P Note (Signed)
History and Physical Interval Note:  01/26/2019 11:22 AM  Joan [name]  has presented today for surgery, with the diagnosis of Failed left total hip.  The various methods of treatment have been discussed with the patient and family. After consideration of risks, benefits and other options for treatment, the patient has consented to  Procedure(s) with comments: TOTAL HIP REVISION (Left) - 2 hrs as a surgical intervention.  The patient's history has been reviewed, patient examined, no change in status, stable for surgery.  I have reviewed the patient's chart and labs.  Questions were answered to the patient's satisfaction.     Mauri Pole

## 2019-01-26 NOTE — Brief Op Note (Signed)
01/26/2019  2:04 PM  PATIENT:  [name]  58 y.o. female  PRE-OPERATIVE DIAGNOSIS:  Left leg length discrepancy - shorter than right following bilateral total hip replacements  POST-OPERATIVE DIAGNOSIS:  Left leg length discrepancy - shorter than right following bilateral total hip replacements  PROCEDURE:  Procedure(s) with comments: TOTAL HIP REVISION (Left) - 2 hrs  SURGEON:  Surgeon(s) and Role:    Paralee Cancel, MD - Primary  PHYSICIAN ASSISTANT: Danae Orleans, PA-C  ANESTHESIA:   general  EBL:  100 cc   BLOOD ADMINISTERED:none  DRAINS: none   LOCAL MEDICATIONS USED:  NONE  SPECIMEN:  No Specimen  DISPOSITION OF SPECIMEN:  N/A  COUNTS:  YES  TOURNIQUET:  * No tourniquets in log *  DICTATION: .Other Dictation: Dictation Number [id number]  PLAN OF CARE: Admit to inpatient   PATIENT DISPOSITION:  PACU - hemodynamically stable.   Delay start of Pharmacological VTE agent (>24hrs) due to surgical blood loss or risk of bleeding: no

## 2019-01-26 NOTE — Transfer of Care (Signed)
Immediate Anesthesia Transfer of Care Note  Patient: Joan [name]  Procedure(s) Performed: TOTAL HIP REVISION (Left Hip)  Patient Location: PACU  Anesthesia Type:General  Level of Consciousness: awake, alert  and patient cooperative  Airway & Oxygen Therapy: Patient Spontanous Breathing and Patient connected to face mask oxygen  Post-op Assessment: Report given to RN and Post -op Vital signs reviewed and stable  Post vital signs: Reviewed and stable  Last Vitals:  Vitals Value Taken Time  BP 128/78 01/26/19 1517  Temp    Pulse 76 01/26/19 1521  Resp 25 01/26/19 1521  SpO2 100 % 01/26/19 1521  Vitals shown include unvalidated device data.  Last Pain:  Vitals:   01/26/19 1210  TempSrc:   PainSc: 4       Patients Stated Pain Goal: 4 ([id number])  Complications: No apparent anesthesia complications

## 2019-01-26 NOTE — Anesthesia Procedure Notes (Signed)
Procedure Name: Intubation Date/Time: 01/26/2019 3:49 PM Performed by: West Pugh, CRNA Pre-anesthesia Checklist: Patient identified, Emergency Drugs available, Suction available, Patient being monitored and Timeout performed Patient Re-evaluated:Patient Re-evaluated prior to induction Oxygen Delivery Method: Circle system utilized Preoxygenation: Pre-oxygenation with 100% oxygen Induction Type: IV induction Ventilation: Mask ventilation without difficulty Laryngoscope Size: Mac and 3 Grade View: Grade I Tube type: Oral Tube size: 7.0 mm Number of attempts: 1 Airway Equipment and Method: Stylet Placement Confirmation: ETT inserted through vocal cords under direct vision,  positive ETCO2,  CO2 detector and breath sounds checked- equal and bilateral Secured at: 21 cm Tube secured with: Tape Dental Injury: Teeth and Oropharynx as per pre-operative assessment

## 2019-01-26 NOTE — Discharge Instructions (Addendum)
INSTRUCTIONS AFTER JOINT REPLACEMENT  ° °o Remove items at home which could result in a fall. This includes throw rugs or furniture in walking pathways °o ICE to the affected joint every three hours while awake for 30 minutes at a time, for at least the first 3-5 days, and then as needed for pain and swelling.  Continue to use ice for pain and swelling. You may notice swelling that will progress down to the foot and ankle.  This is normal after surgery.  Elevate your leg when you are not up walking on it.   °o Continue to use the breathing machine you got in the hospital (incentive spirometer) which will help keep your temperature down.  It is common for your temperature to cycle up and down following surgery, especially at night when you are not up moving around and exerting yourself.  The breathing machine keeps your lungs expanded and your temperature down. ° ° °DIET:  As you were doing prior to hospitalization, we recommend a well-balanced diet. ° °DRESSING / WOUND CARE / SHOWERING ° °Keep the surgical dressing until follow up.  The dressing is water proof, so you can shower without any extra covering.  IF THE DRESSING FALLS OFF or the wound gets wet inside, change the dressing with sterile gauze.  Please use good hand washing techniques before changing the dressing.  Do not use any lotions or creams on the incision until instructed by your surgeon.   ° °ACTIVITY ° °o Increase activity slowly as tolerated, but follow the weight bearing instructions below.   °o No driving for 6 weeks or until further direction given by your physician.  You cannot drive while taking narcotics.  °o No lifting or carrying greater than 10 lbs. until further directed by your surgeon. °o Avoid periods of inactivity such as sitting longer than an hour when not asleep. This helps prevent blood clots.  °o You may return to work once you are authorized by your doctor.  ° ° ° °WEIGHT BEARING  ° °Weight bearing as tolerated with assist  device (walker, cane, etc) as directed, use it as long as suggested by your surgeon or therapist, typically at least 4-6 weeks. ° ° °EXERCISES ° °Results after joint replacement surgery are often greatly improved when you follow the exercise, range of motion and muscle strengthening exercises prescribed by your doctor. Safety measures are also important to protect the joint from further injury. Any time any of these exercises cause you to have increased pain or swelling, decrease what you are doing until you are comfortable again and then slowly increase them. If you have problems or questions, call your caregiver or physical therapist for advice.  ° °Rehabilitation is important following a joint replacement. After just a few days of immobilization, the muscles of the leg can become weakened and shrink (atrophy).  These exercises are designed to build up the tone and strength of the thigh and leg muscles and to improve motion. Often times heat used for twenty to thirty minutes before working out will loosen up your tissues and help with improving the range of motion but do not use heat for the first two weeks following surgery (sometimes heat can increase post-operative swelling).  ° °These exercises can be done on a training (exercise) mat, on the floor, on a table or on a bed. Use whatever works the best and is most comfortable for you.    Use music or television while you are exercising so that   the exercises are a pleasant break in your day. This will make your life better with the exercises acting as a break in your routine that you can look forward to.   Perform all exercises about fifteen times, three times per day or as directed.  You should exercise both the operative leg and the other leg as well. ° °Exercises include: °  °• Quad Sets - Tighten up the muscle on the front of the thigh (Quad) and hold for 5-10 seconds.   °• Straight Leg Raises - With your knee straight (if you were given a brace, keep it on),  lift the leg to 60 degrees, hold for 3 seconds, and slowly lower the leg.  Perform this exercise against resistance later as your leg gets stronger.  °• Leg Slides: Lying on your back, slowly slide your foot toward your buttocks, bending your knee up off the floor (only go as far as is comfortable). Then slowly slide your foot back down until your leg is flat on the floor again.  °• Angel Wings: Lying on your back spread your legs to the side as far apart as you can without causing discomfort.  °• Hamstring Strength:  Lying on your back, push your heel against the floor with your leg straight by tightening up the muscles of your buttocks.  Repeat, but this time bend your knee to a comfortable angle, and push your heel against the floor.  You may put a pillow under the heel to make it more comfortable if necessary.  ° °A rehabilitation program following joint replacement surgery can speed recovery and prevent re-injury in the future due to weakened muscles. Contact your doctor or a physical therapist for more information on knee rehabilitation.  ° ° °CONSTIPATION ° °Constipation is defined medically as fewer than three stools per week and severe constipation as less than one stool per week.  Even if you have a regular bowel pattern at home, your normal regimen is likely to be disrupted due to multiple reasons following surgery.  Combination of anesthesia, postoperative narcotics, change in appetite and fluid intake all can affect your bowels.  ° °YOU MUST use at least one of the following options; they are listed in order of increasing strength to get the job done.  They are all available over the counter, and you may need to use some, POSSIBLY even all of these options:   ° °Drink plenty of fluids (prune juice may be helpful) and high fiber foods °Colace 100 mg by mouth twice a day  °Senokot for constipation as directed and as needed Dulcolax (bisacodyl), take with full glass of water  °Miralax (polyethylene glycol)  once or twice a day as needed. ° °If you have tried all these things and are unable to have a bowel movement in the first 3-4 days after surgery call either your surgeon or your primary doctor.   ° °If you experience loose stools or diarrhea, hold the medications until you stool forms back up.  If your symptoms do not get better within 1 week or if they get worse, check with your doctor.  If you experience "the worst abdominal pain ever" or develop nausea or vomiting, please contact the office immediately for further recommendations for treatment. ° ° °ITCHING:  If you experience itching with your medications, try taking only a single pain pill, or even half a pain pill at a time.  You can also use Benadryl over the counter for itching or also to   help with sleep.  ° °TED HOSE STOCKINGS:  Use stockings on both legs until for at least 2 weeks or as directed by physician office. They may be removed at night for sleeping. ° °MEDICATIONS:  See your medication summary on the “After Visit Summary” that nursing will review with you.  You may have some home medications which will be placed on hold until you complete the course of blood thinner medication.  It is important for you to complete the blood thinner medication as prescribed. ° °PRECAUTIONS:  If you experience chest pain or shortness of breath - call 911 immediately for transfer to the hospital emergency department.  ° °If you develop a fever greater that 101 F, purulent drainage from wound, increased redness or drainage from wound, foul odor from the wound/dressing, or calf pain - CONTACT YOUR SURGEON.   °                                                °FOLLOW-UP APPOINTMENTS:  If you do not already have a post-op appointment, please call the office for an appointment to be seen by your surgeon.  Guidelines for how soon to be seen are listed in your “After Visit Summary”, but are typically between 1-4 weeks after surgery. ° °OTHER INSTRUCTIONS:  ° °Knee  Replacement:  Do not place pillow under knee, focus on keeping the knee straight while resting.  ° °MAKE SURE YOU:  °• Understand these instructions.  °• Get help right away if you are not doing well or get worse.  ° ° °Thank you for letting us be a part of your medical care team.  It is a privilege we respect greatly.  We hope these instructions will help you stay on track for a fast and full recovery!  ° _________________________ ° °Information on my medicine - XARELTO® (Rivaroxaban) ° °This medication education was reviewed with me or my healthcare representative as part of my discharge preparation.  ° °Why was Xarelto® prescribed for you? °Xarelto® was prescribed for you to reduce the risk of blood clots forming after orthopedic surgery. The medical term for these abnormal blood clots is venous thromboembolism (VTE). ° °What do you need to know about xarelto® ? °Take your Xarelto® ONCE DAILY at the same time every day. °You may take it either with or without food. ° °If you have difficulty swallowing the tablet whole, you may crush it and mix in applesauce just prior to taking your dose. ° °Take Xarelto® exactly as prescribed by your doctor and DO NOT stop taking Xarelto® without talking to the doctor who prescribed the medication.  Stopping without other VTE prevention medication to take the place of Xarelto® may increase your risk of developing a clot. ° °After discharge, you should have regular check-up appointments with your healthcare provider that is prescribing your Xarelto®.   ° °What do you do if you miss a dose? °If you miss a dose, take it as soon as you remember on the same day then continue your regularly scheduled once daily regimen the next day. Do not take two doses of Xarelto® on the same day.  ° °Important Safety Information °A possible side effect of Xarelto® is bleeding. You should call your healthcare provider right away if you experience any of the following: °? Bleeding from an injury or  your nose that   does not stop. °? Unusual colored urine (red or dark brown) or unusual colored stools (red or black). °? Unusual bruising for unknown reasons. °? A serious fall or if you hit your head (even if there is no bleeding). ° °Some medicines may interact with Xarelto® and might increase your risk of bleeding while on Xarelto®. To help avoid this, consult your healthcare provider or pharmacist prior to using any new prescription or non-prescription medications, including herbals, vitamins, non-steroidal anti-inflammatory drugs (NSAIDs) and supplements. ° °This website has more information on Xarelto®: www.xarelto.com. ° ° °

## 2019-01-27 ENCOUNTER — Encounter (HOSPITAL_COMMUNITY): Payer: Self-pay | Admitting: Orthopedic Surgery

## 2019-01-27 DIAGNOSIS — E669 Obesity, unspecified: Secondary | ICD-10-CM | POA: Diagnosis present

## 2019-01-27 LAB — CBC
HCT: 36.7 % (ref 36.0–46.0)
Hemoglobin: 12.3 g/dL (ref 12.0–15.0)
MCH: 32.2 pg (ref 26.0–34.0)
MCHC: 33.5 g/dL (ref 30.0–36.0)
MCV: 96.1 fL (ref 80.0–100.0)
Platelets: 205 10*3/uL (ref 150–400)
RBC: 3.82 MIL/uL — ABNORMAL LOW (ref 3.87–5.11)
RDW: 13.4 % (ref 11.5–15.5)
WBC: 12.2 10*3/uL — ABNORMAL HIGH (ref 4.0–10.5)
nRBC: 0 % (ref 0.0–0.2)

## 2019-01-27 LAB — BASIC METABOLIC PANEL
Anion gap: 15 (ref 5–15)
BUN: 15 mg/dL (ref 6–20)
CO2: 21 mmol/L — ABNORMAL LOW (ref 22–32)
Calcium: 8.4 mg/dL — ABNORMAL LOW (ref 8.9–10.3)
Chloride: 99 mmol/L (ref 98–111)
Creatinine, Ser: 1.2 mg/dL — ABNORMAL HIGH (ref 0.44–1.00)
GFR calc Af Amer: 58 mL/min — ABNORMAL LOW (ref >60)
GFR calc non Af Amer: 50 mL/min — ABNORMAL LOW (ref >60)
Glucose, Bld: 376 mg/dL — ABNORMAL HIGH (ref 70–99)
Potassium: 3.6 mmol/L (ref 3.5–5.1)
Sodium: 135 mmol/L (ref 135–145)

## 2019-01-27 LAB — GLUCOSE, CAPILLARY: Glucose-Capillary: 119 mg/dL — ABNORMAL HIGH (ref 70–99)

## 2019-01-27 MED ORDER — FERROUS SULFATE 325 (65 FE) MG PO TABS: 325.0000 mg | ORAL_TABLET | Freq: Three times a day (TID) | ORAL | 0 refills | Status: DC

## 2019-01-27 MED ORDER — DIPHENHYDRAMINE HCL 50 MG/ML IJ SOLN
25.0000 mg | Freq: Four times a day (QID) | INTRAMUSCULAR | Status: AC | PRN
  Administered 2019-01-28: 25 mg via INTRAVENOUS
  Filled 2019-01-27: qty 1

## 2019-01-27 MED ORDER — HYDROCODONE-ACETAMINOPHEN 7.5-325 MG PO TABS: 1.0000 | ORAL_TABLET | ORAL | 0 refills | Status: DC | PRN

## 2019-01-27 MED ORDER — POLYETHYLENE GLYCOL 3350 17 G PO PACK: 17.0000 g | PACK | Freq: Two times a day (BID) | ORAL | 0 refills | Status: DC

## 2019-01-27 MED ORDER — DOCUSATE SODIUM 100 MG PO CAPS: 100.0000 mg | ORAL_CAPSULE | Freq: Two times a day (BID) | ORAL | 0 refills | Status: DC

## 2019-01-27 MED ORDER — RIVAROXABAN 10 MG PO TABS: 10.0000 mg | ORAL_TABLET | Freq: Every day | ORAL | 0 refills | Status: DC

## 2019-01-27 NOTE — Progress Notes (Signed)
Physical Therapy Treatment Patient Details Name: Joan [name] MRN: [id number] DOB: 1960-10-12 Today's Date: 01/27/2019    History of Present Illness Patient is 58 y.o. female s/p Lt THR with PMH significant for HTN, hypothyroidism, GERD, COPD, DM, chronic pain, anxiety, depression, and OA.    PT Comments    Pt cooperative and progressing with mobility but with frequent cues to focus on task and to adhere to THP.  Follow Up Recommendations  SNF;Supervision for mobility/OOB     Equipment Recommendations  None recommended by PT    Recommendations for Other Services       Precautions / Restrictions Precautions Precautions: Fall;Posterior Hip Precaution Booklet Issued: Yes (comment) Restrictions Weight Bearing Restrictions: No LLE Weight Bearing: Weight bearing as tolerated    Mobility  Bed Mobility               General bed mobility comments: Pt recieved up in chair and requests back to same  Transfers Overall transfer level: Needs assistance Equipment used: Rolling walker (2 wheeled) Transfers: Sit to/from Stand Sit to Stand: Min assist         General transfer comment: cues for LE management, use of UEs to self assist and adherence to THP  Ambulation/Gait Ambulation/Gait assistance: Min assist Gait Distance (Feet): 112 Feet Assistive device: Rolling walker (2 wheeled) Gait Pattern/deviations: Step-to pattern;Decreased stride length;Decreased stance time - left;Decreased step length - right;Decreased weight shift to left Gait velocity: decr   General Gait Details: cues for posture, position from RW, sequence and ER on L   Stairs             Wheelchair Mobility    Modified Rankin (Stroke Patients Only)       Balance Overall balance assessment: Needs assistance Sitting-balance support: No upper extremity supported;Feet supported Sitting balance-Leahy Scale: Good     Standing balance support: During functional activity;Bilateral upper  extremity supported Standing balance-Leahy Scale: Poor                              Cognition Arousal/Alertness: Awake/alert Behavior During Therapy: Restless;Impulsive Overall Cognitive Status: No family/caregiver present to determine baseline cognitive functioning                                 General Comments: pt with poor ability to focus on tasks and required simple one step commands; pt was disjointed with conversation and required cues to focus on questions or tasks; pt requried frequent and repeated reminders for posterior hip precautions and has decreased safety awareness      Exercises      General Comments        Pertinent Vitals/Pain Pain Assessment: 0-10 Pain Score: 6  Pain Location: left hip Pain Descriptors / Indicators: Aching;Sore Pain Intervention(s): Limited activity within patient's tolerance;Monitored during session;Premedicated before session;Ice applied    Home Living                      Prior Function            PT Goals (current goals can now be found in the care plan section) Acute Rehab PT Goals Patient Stated Goal: none stated by patient PT Goal Formulation: With patient Time For Goal Achievement: 02/02/19 Potential to Achieve Goals: Fair Progress towards PT goals: Progressing toward goals    Frequency    7X/week  PT Plan Current plan remains appropriate    Co-evaluation              AM-PAC PT "6 Clicks" Mobility   Outcome Measure  Help needed turning from your back to your side while in a flat bed without using bedrails?: A Little Help needed moving from lying on your back to sitting on the side of a flat bed without using bedrails?: A Little Help needed moving to and from a bed to a chair (including a wheelchair)?: A Little Help needed standing up from a chair using your arms (e.g., wheelchair or bedside chair)?: A Little Help needed to walk in hospital room?: A Little Help  needed climbing 3-5 steps with a railing? : A Lot 6 Click Score: 17    End of Session Equipment Utilized During Treatment: Gait belt Activity Tolerance: Patient tolerated treatment well Patient left: in chair;with call bell/phone within reach;with chair alarm set Nurse Communication: Mobility status PT Visit Diagnosis: Muscle weakness (generalized) (M62.81);Difficulty in walking, not elsewhere classified (R26.2)     Time: 4098-1191 PT Time Calculation (min) (ACUTE ONLY): 30 min  Charges:  $Gait Training: 23-37 mins                     Joan [name] PT Acute Rehabilitation Services Pager [phone] Office [phone]    Joan [name] 01/27/2019, 2:04 PM

## 2019-01-27 NOTE — Progress Notes (Signed)
OT Cancellation Note  Patient Details Name: Joan [name] MRN: [id number] DOB: January 11, 1961   Cancelled Treatment:    Reason Eval/Treat Not Completed: Other (comment).  RN is with pt.  Will check back later.  Amyre Segundo 01/27/2019, 1:14 PM  Lesle Chris, OTR/L Acute Rehabilitation Services [phone] [facility] pager [phone] office 01/27/2019

## 2019-01-27 NOTE — TOC Transition Note (Signed)
Transition of Care [facility]) - CM/SW Discharge Note   Patient Details  Name: Joan [name] MRN: [id number] Date of Birth: [birth year]  Transition of Care [facility] CM/SW Contact:  Lia Hopping, [name] Phone Number: 01/27/2019, 1:18 PM   Clinical Narrative:   CSW met with the patient at bedside to discuss Home Health PT options. Patient reports in the past she used [facility] prefers to use them again. CSW reached out to the Rep. Sharmon Revere. She reports at this time they cannot accept the patient. CSW notified the patient at bedside and got permission to look into other agencies in network with her insurance company. CSW reached out to [facility], and confirm availability. Patient has DME.    Update:CSW received a message from nurse that patient brother [name] request to talk with the Education officer, museum. CSW met with [name] outside the patient room. Patient brother express concerns about the patient mental health. He reports the patient has shown paranoid behavior and has been more agitated with him at home.CSW met with the patient at bedside to discuss the patient behaviors. Patient reports she been talking with her counselor about her behaviors, they have have suggested similar behaviors to those that have parnoid schizophrenia. Patient brother reports she has not been diagnosed. The patient is currently active with [facility] in [place]. She has weekly appointment with a counselor. She has a scheduled appointment/evaluation with the Psychiatrist Wilburn Cornelia, MD on December 8th at 11:00am. Patient is agreeable for her brother [name] to sit in on the session. Patient has mental history for Bipolar and Depression and is currently taking Wellbutrin and Desyrel. Patient and brother appreciative of support.    Final next level of care: [place] Barriers to Discharge: No Barriers Identified   Patient Goals and CMS Choice Patient states their goals for this  hospitalization and ongoing recovery are:: Continue therapy at home CMS Medicare.gov Compare Post Acute Care list provided to:: Patient Choice offered to / list presented to : Patient, Sibling  Discharge Placement                       Discharge Plan and Services                          HH Arranged: PT [facility] Agency: [facility] (formerly [employer]) Date [place]: 01/27/19 Time [place]: 1219 Representative spoke with at [place]: [place] ([place]) Interventions     Readmission Risk Interventions No flowsheet data found.

## 2019-01-27 NOTE — Progress Notes (Signed)
     Subjective: 1 Day Post-Op Procedure(s) (LRB): TOTAL HIP REVISION (Left)   Patient reports pain as mild, pain controlled.  No events throughout the night. Dr. Alvan Dame discussed the procedure, findings and expectations moving forward. Patient will be discharged home, if she does well with PT.  She will follow up in the clinic in 2 weeks.  She knows to call with any questions or concerns.      Objective:   VITALS:   Vitals:   01/27/19 0027 01/27/19 0433  BP: 111/70 119/62  Pulse: 75 72  Resp: 18 18  Temp: 97.7 F (36.5 C) 97.8 F (36.6 C)  SpO2: 99% 95%    Dorsiflexion/Plantar flexion intact Incision: dressing C/D/I No cellulitis present Compartment soft  LABS Recent Labs    01/27/19 0242  HGB 12.3  HCT 36.7  WBC 12.2*  PLT 205    Recent Labs    01/27/19 0242  NA 135  K 3.6  BUN 15  CREATININE 1.20*  GLUCOSE 376*     Assessment/Plan: 1 Day Post-Op Procedure(s) (LRB): TOTAL HIP REVISION (Left) Foley cath d/c'ed Advance diet Up with therapy D/C IV fluids Discharge home Follow up in 2 weeks at [facility] Follow up with OLIN,Thinh Cuccaro D in 2 weeks.  Contact information:  [facility] [address], [address] [phone]    Obese (BMI 30-39.9) Estimated body mass index is 30.17 kg/m as calculated from the following:   Height as of this encounter: 5\' 8"  (1.727 m).   Weight as of this encounter: 90 kg. Patient also counseled that weight may inhibit the healing process Patient counseled that losing weight will help with future health issues       [place]. Reet Scharrer   PAC  01/27/2019, 8:08 AM

## 2019-01-27 NOTE — Progress Notes (Signed)
Physical Therapy Treatment Patient Details Name: Joan [name] MRN: [id number] DOB: September 12, 1960 Today's Date: 01/27/2019    History of Present Illness Patient is 58 y.o. female s/p Lt THR with PMH significant for HTN, hypothyroidism, GERD, COPD, DM, chronic pain, anxiety, depression, and OA.    PT Comments    Pt performed home therex program with assist.   Follow Up Recommendations  SNF;Supervision for mobility/OOB     Equipment Recommendations  None recommended by PT    Recommendations for Other Services       Precautions / Restrictions Precautions Precautions: Fall;Posterior Hip Precaution Booklet Issued: Yes (comment) Precaution Comments: THP reviewed Restrictions Weight Bearing Restrictions: No LLE Weight Bearing: Weight bearing as tolerated    Mobility  Bed Mobility Overal bed mobility: Needs Assistance Bed Mobility: Supine to Sit     Supine to sit: Min assist;HOB elevated     General bed mobility comments: cues for sequence, use of R LE to self assist and adherence to THP  Transfers Overall transfer level: Needs assistance Equipment used: Rolling walker (2 wheeled) Transfers: Sit to/from Stand Sit to Stand: Min guard         General transfer comment: cues for LE management, use of UEs to self assist and adherence to THP  Ambulation/Gait Ambulation/Gait assistance: Min guard Gait Distance (Feet): 150 Feet(and 15' into bedroom) Assistive device: Rolling walker (2 wheeled) Gait Pattern/deviations: Step-to pattern;Decreased stride length;Decreased stance time - left;Decreased step length - right;Decreased weight shift to left Gait velocity: decr   General Gait Details: cues for posture, position from RW, sequence and ER on L   Stairs             Wheelchair Mobility    Modified Rankin (Stroke Patients Only)       Balance Overall balance assessment: Needs assistance Sitting-balance support: No upper extremity supported;Feet  supported Sitting balance-Leahy Scale: Good     Standing balance support: Bilateral upper extremity supported Standing balance-Leahy Scale: Fair                              Cognition Arousal/Alertness: Awake/alert Behavior During Therapy: Restless;Impulsive Overall Cognitive Status: No family/caregiver present to determine baseline cognitive functioning                                 General Comments: pt with poor ability to focus on tasks and required simple one step commands; pt was disjointed with conversation and required cues to focus on questions or tasks; pt requried frequent and repeated reminders for posterior hip precautions and has decreased safety awareness      Exercises Total Joint Exercises Ankle Circles/Pumps: AROM;Both;15 reps;Supine Quad Sets: AROM;Both;10 reps;Supine Heel Slides: AAROM;Left;20 reps;Supine Hip ABduction/ADduction: AAROM;Left;15 reps;Supine    General Comments        Pertinent Vitals/Pain Pain Assessment: 0-10 Pain Score: 4  Pain Location: left hip Pain Descriptors / Indicators: Aching;Sore Pain Intervention(s): Limited activity within patient's tolerance;Monitored during session;Premedicated before session    Home Living                      Prior Function            PT Goals (current goals can now be found in the care plan section) Acute Rehab PT Goals Patient Stated Goal: none stated by patient PT Goal Formulation: With patient Time For Goal  Achievement: 02/02/19 Potential to Achieve Goals: Fair Progress towards PT goals: Progressing toward goals    Frequency    7X/week      PT Plan Current plan remains appropriate    Co-evaluation              AM-PAC PT "6 Clicks" Mobility   Outcome Measure  Help needed turning from your back to your side while in a flat bed without using bedrails?: A Little Help needed moving from lying on your back to sitting on the side of a flat bed  without using bedrails?: A Little Help needed moving to and from a bed to a chair (including a wheelchair)?: A Little Help needed standing up from a chair using your arms (e.g., wheelchair or bedside chair)?: A Little Help needed to walk in hospital room?: A Little Help needed climbing 3-5 steps with a railing? : A Lot 6 Click Score: 17    End of Session Equipment Utilized During Treatment: Gait belt Activity Tolerance: Patient tolerated treatment well Patient left: in bed;with call bell/phone within reach Nurse Communication: Mobility status PT Visit Diagnosis: Muscle weakness (generalized) (M62.81);Difficulty in walking, not elsewhere classified (R26.2)     Time: 5284-1324 PT Time Calculation (min) (ACUTE ONLY): 21 min  Charges:  $Gait Training: 23-37 mins $Therapeutic Exercise: 8-22 mins                     Mauro Kaufmann PT Acute Rehabilitation Services Pager [phone] Office [phone]    Zabella Wease 01/27/2019, 4:55 PM

## 2019-01-27 NOTE — Progress Notes (Signed)
RT in to see pt. for STAT ordered ABG, pt. refused procedure, is currently on 2 lpm n/c with oxygen saturation of 98% and alert, CPAP remains at bedside, RN notified.

## 2019-01-27 NOTE — Progress Notes (Signed)
Physical Therapy Treatment Patient Details Name: Joan [name] MRN: [id number] DOB: 1960-07-21 Today's Date: 01/27/2019    History of Present Illness Patient is 57 y.o. female s/p Lt THR with PMH significant for HTN, hypothyroidism, GERD, COPD, DM, chronic pain, anxiety, depression, and OA.    PT Comments    Pt progressing with mobility but continues to require increased cues for focus on task and adherence to THP.   Follow Up Recommendations  SNF;Supervision for mobility/OOB     Equipment Recommendations  None recommended by PT    Recommendations for Other Services       Precautions / Restrictions Precautions Precautions: Fall;Posterior Hip Precaution Booklet Issued: Yes (comment) Precaution Comments: THP reviewed Restrictions Weight Bearing Restrictions: No LLE Weight Bearing: Weight bearing as tolerated    Mobility  Bed Mobility Overal bed mobility: Needs Assistance Bed Mobility: Supine to Sit     Supine to sit: Min assist;HOB elevated     General bed mobility comments: cues for sequence, use of R LE to self assist and adherence to THP  Transfers Overall transfer level: Needs assistance Equipment used: Rolling walker (2 wheeled) Transfers: Sit to/from Stand Sit to Stand: Min guard         General transfer comment: cues for LE management, use of UEs to self assist and adherence to THP  Ambulation/Gait Ambulation/Gait assistance: Min guard Gait Distance (Feet): 150 Feet(and 15' into bedroom) Assistive device: Rolling walker (2 wheeled) Gait Pattern/deviations: Step-to pattern;Decreased stride length;Decreased stance time - left;Decreased step length - right;Decreased weight shift to left Gait velocity: decr   General Gait Details: cues for posture, position from RW, sequence and ER on L   Stairs             Wheelchair Mobility    Modified Rankin (Stroke Patients Only)       Balance Overall balance assessment: Needs  assistance Sitting-balance support: No upper extremity supported;Feet supported Sitting balance-Leahy Scale: Good     Standing balance support: Bilateral upper extremity supported Standing balance-Leahy Scale: Fair                              Cognition Arousal/Alertness: Awake/alert Behavior During Therapy: Restless;Impulsive Overall Cognitive Status: No family/caregiver present to determine baseline cognitive functioning                                 General Comments: pt with poor ability to focus on tasks and required simple one step commands; pt was disjointed with conversation and required cues to focus on questions or tasks; pt requried frequent and repeated reminders for posterior hip precautions and has decreased safety awareness      Exercises      General Comments        Pertinent Vitals/Pain Pain Assessment: 0-10 Pain Score: 4  Pain Location: left hip Pain Descriptors / Indicators: Aching;Sore Pain Intervention(s): Limited activity within patient's tolerance;Monitored during session;Premedicated before session;Ice applied    Home Living                      Prior Function            PT Goals (current goals can now be found in the care plan section) Acute Rehab PT Goals Patient Stated Goal: none stated by patient PT Goal Formulation: With patient Time For Goal Achievement: 02/02/19 Potential to Achieve  Goals: Fair Progress towards PT goals: Progressing toward goals    Frequency    7X/week      PT Plan Current plan remains appropriate    Co-evaluation              AM-PAC PT "6 Clicks" Mobility   Outcome Measure  Help needed turning from your back to your side while in a flat bed without using bedrails?: A Little Help needed moving from lying on your back to sitting on the side of a flat bed without using bedrails?: A Little Help needed moving to and from a bed to a chair (including a wheelchair)?: A  Little Help needed standing up from a chair using your arms (e.g., wheelchair or bedside chair)?: A Little Help needed to walk in hospital room?: A Little Help needed climbing 3-5 steps with a railing? : A Lot 6 Click Score: 17    End of Session Equipment Utilized During Treatment: Gait belt Activity Tolerance: Patient tolerated treatment well Patient left: Other (comment)(sitting EOB for lunch) Nurse Communication: Mobility status PT Visit Diagnosis: Muscle weakness (generalized) (M62.81);Difficulty in walking, not elsewhere classified (R26.2)     Time: 9528-4132 PT Time Calculation (min) (ACUTE ONLY): 28 min  Charges:  $Gait Training: 23-37 mins                     Mauro Kaufmann PT Acute Rehabilitation Services Pager [phone] Office [phone]    Shelle Galdamez 01/27/2019, 4:50 PM

## 2019-01-28 LAB — BASIC METABOLIC PANEL
Anion gap: 8 (ref 5–15)
BUN: 22 mg/dL — ABNORMAL HIGH (ref 6–20)
CO2: 30 mmol/L (ref 22–32)
Calcium: 8.7 mg/dL — ABNORMAL LOW (ref 8.9–10.3)
Chloride: 100 mmol/L (ref 98–111)
Creatinine, Ser: 0.92 mg/dL (ref 0.44–1.00)
GFR calc Af Amer: >60 mL/min (ref >60)
GFR calc non Af Amer: >60 mL/min (ref >60)
Glucose, Bld: 172 mg/dL — ABNORMAL HIGH (ref 70–99)
Potassium: 3.2 mmol/L — ABNORMAL LOW (ref 3.5–5.1)
Sodium: 138 mmol/L (ref 135–145)

## 2019-01-28 LAB — CBC
HCT: 34.4 % — ABNORMAL LOW (ref 36.0–46.0)
Hemoglobin: 11.3 g/dL — ABNORMAL LOW (ref 12.0–15.0)
MCH: 32.4 pg (ref 26.0–34.0)
MCHC: 32.8 g/dL (ref 30.0–36.0)
MCV: 98.6 fL (ref 80.0–100.0)
Platelets: 254 10*3/uL (ref 150–400)
RBC: 3.49 MIL/uL — ABNORMAL LOW (ref 3.87–5.11)
RDW: 13.3 % (ref 11.5–15.5)
WBC: 12.9 10*3/uL — ABNORMAL HIGH (ref 4.0–10.5)
nRBC: 0 % (ref 0.0–0.2)

## 2019-01-28 LAB — CK TOTAL AND CKMB (NOT AT ARMC)
CK, MB: 5.9 ng/mL — ABNORMAL HIGH (ref 0.5–5.0)
CK, MB: 6.3 ng/mL — ABNORMAL HIGH (ref 0.5–5.0)
Relative Index: 1.8 (ref 0.0–2.5)
Relative Index: 1.9 (ref 0.0–2.5)
Total CK: 334 U/L — ABNORMAL HIGH (ref 38–234)
Total CK: 324 U/L — ABNORMAL HIGH (ref 38–234)

## 2019-01-28 LAB — TROPONIN I (HIGH SENSITIVITY)
Troponin I (High Sensitivity): 2 ng/L (ref <18)
Troponin I (High Sensitivity): 2 ng/L (ref <18)

## 2019-01-28 LAB — SARS CORONAVIRUS 2 (TAT 6-24 HRS): SARS Coronavirus 2: NEGATIVE

## 2019-01-28 MED ORDER — FLEET ENEMA 7-19 GM/118ML RE ENEM
1.0000 | ENEMA | Freq: Once | RECTAL | Status: AC
  Administered 2019-01-28: 1 via RECTAL
  Filled 2019-01-28: qty 1

## 2019-01-28 NOTE — Plan of Care (Signed)
  Problem: Education: Goal: Knowledge of the prescribed therapeutic regimen will improve Outcome: Progressing Goal: Understanding of discharge needs will improve Outcome: Progressing Goal: Individualized Educational Video(s) Outcome: Progressing   Problem: Activity: Goal: Ability to avoid complications of mobility impairment will improve Outcome: Progressing Goal: Ability to tolerate increased activity will improve Outcome: Progressing   Problem: Clinical Measurements: Goal: Postoperative complications will be avoided or minimized Outcome: Progressing   

## 2019-01-28 NOTE — Plan of Care (Signed)
  Problem: Education: Goal: Knowledge of the prescribed therapeutic regimen will improve Outcome: Progressing   Problem: Activity: Goal: Ability to tolerate increased activity will improve Outcome: Progressing   Problem: Pain Management: Goal: Pain level will decrease with appropriate interventions Outcome: Progressing   Problem: Education: Goal: Knowledge of General Education information will improve Description: Including pain rating scale, medication(s)/side effects and non-pharmacologic comfort measures Outcome: Progressing   Problem: Clinical Measurements: Goal: Respiratory complications will improve Outcome: Progressing   Problem: Activity: Goal: Risk for activity intolerance will decrease Outcome: Progressing

## 2019-01-28 NOTE — Progress Notes (Signed)
Patient ID: RENNIE HEFTER, female   DOB: 1961-01-02, 58 y.o.   MRN: [id number]   Subjective: 2 Days Post-Op Procedure(s) (LRB): TOTAL HIP REVISION (Left)    Patient reports pain as mild.  Soreness around left hip.   Nausea episode noted, no bowel activity since pre-op Sore throat likely related to intubation from surgery  Objective:   VITALS:   Vitals:   01/27/19 2214 01/28/19 0529  BP: 128/80 (!) 152/82  Pulse: 78 97  Resp: 18 18  Temp: 97.9 F (36.6 C) 98.1 F (36.7 C)  SpO2:  96%    Neurovascular intact Incision: dressing C/D/I  Leg length discrepancy noticeably improved to her at this early point  LABS Recent Labs    01/27/19 0242 01/28/19 0211  HGB 12.3 11.3*  HCT 36.7 34.4*  WBC 12.2* 12.9*  PLT 205 254    Recent Labs    01/27/19 0242 01/28/19 0211  NA 135 138  K 3.6 3.2*  BUN 15 22*  CREATININE 1.20* 0.92  GLUCOSE 376* 172*    No results for input(s): LABPT, INR in the last 72 hours.   Assessment/Plan: 2 Days Post-Op Procedure(s) (LRB): TOTAL HIP REVISION (Left)   Up with therapy  Mag Citrate today to help with bowels Home after therapy X2 today Home PT to be set up for gait training and home safety

## 2019-01-28 NOTE — Evaluation (Signed)
Occupational Therapy Evaluation Patient Details Name: Joan [name] MRN: [id number] DOB: 03-May-1960 Today's Date: 01/28/2019    History of Present Illness Patient is 58 y.o. female s/p Lt THR with PMH significant for HTN, hypothyroidism, GERD, COPD, DM, chronic pain, anxiety, depression, and OA.   Clinical Impression   Pt was admitted for the above. She has a h/o previous THA with precautions. She did need a couple of cues today, partially because she was restless on commode due to discomfort. Will follow in acute setting with mod I level goals    Follow Up Recommendations  Home health OT;Follow surgeon's recommendation for DC plan and follow-up therapies(pt is home alone)    Equipment Recommendations  None recommended by OT    Recommendations for Other Services       Precautions / Restrictions Precautions Precautions: Fall;Posterior Hip Precaution Booklet Issued: Yes (comment) Precaution Comments: THP reviewed Restrictions LLE Weight Bearing: Weight bearing as tolerated      Mobility Bed Mobility               General bed mobility comments: sit to sidelying:  min A to maintain THPs; educated on using strap or sheet  Transfers   Equipment used: Rolling walker (2 wheeled)   Sit to Stand: Supervision;Min guard         General transfer comment: for safety    Balance                                           ADL either performed or assessed with clinical judgement   ADL Overall ADL's : Needs assistance/impaired                         Toilet Transfer: Supervision/safety;Min guard;Ambulation;BSC;RW   Toileting- Clothing Manipulation and Hygiene: Supervision/safety;Sit to/from stand         General ADL Comments: pt has AE and is mostly at a supervision level, based on clinical judgment. Did not AE with me today 2* isolation room.  Pt verbalizes precautions and mostly followed them, however when sitting on toilet, she needed cues  not to lean forward, as she was very uncomfortable.  Recommend she use sheet or belt for back to bed. She has tried the leg lifter before and didn't like it.  Reviewed leaning back when getting LLE over tub ledge on transfer bench     Vision         Perception     Praxis      Pertinent Vitals/Pain Pain Location: L hip and buttocks and legs Pain Descriptors / Indicators: Aching;Sore Pain Intervention(s): Limited activity within patient's tolerance;Monitored during session;Repositioned;Patient requesting pain meds-RN notified     Hand Dominance Right   Extremity/Trunk Assessment Upper Extremity Assessment Upper Extremity Assessment: Overall WFL for tasks assessed           Communication Communication Communication: No difficulties   Cognition Arousal/Alertness: Awake/alert Behavior During Therapy: Restless;Impulsive Overall Cognitive Status: No family/caregiver present to determine baseline cognitive functioning                                     General Comments       Exercises     Shoulder Instructions      Home Living Family/patient expects to be discharged  to:: Private residence Living Arrangements: Other relatives Available Help at Discharge: Family               Bathroom Shower/Tub: Teacher, early years/pre: [place]: Environmental consultant - 2 wheels;Walker - 4 wheels;Cane - single point;Bedside commode;Tub bench          Prior Functioning/Environment Level of Independence: Independent with assistive device(s)        Comments: has AE kit        OT Problem List: Decreased strength;Decreased activity tolerance;Decreased knowledge of precautions;Pain;Decreased knowledge of use of DME or AE      OT Treatment/Interventions: Self-care/ADL training;DME and/or AE instruction;Patient/family education;Therapeutic activities    OT Goals(Current goals can be found in the care plan section) Acute Rehab OT  Goals Patient Stated Goal: none stated by patient OT Goal Formulation: With patient Time For Goal Achievement: 02/11/19 Potential to Achieve Goals: Good ADL Goals Additional ADL Goal #1: pt will be mod I for basic adl, and toileting following THPS, with AE as needed  OT Frequency: Min 2X/week   Barriers to D/C:            Co-evaluation              AM-PAC OT "6 Clicks" Daily Activity     Outcome Measure Help from another person eating meals?: None Help from another person taking care of personal grooming?: A Little Help from another person toileting, which includes using toliet, bedpan, or urinal?: A Little Help from another person bathing (including washing, rinsing, drying)?: A Little Help from another person to put on and taking off regular upper body clothing?: A Little Help from another person to put on and taking off regular lower body clothing?: A Little 6 Click Score: 19   End of Session    Activity Tolerance: Patient tolerated treatment well Patient left: in bed;with call bell/phone within reach;with bed alarm set  OT Visit Diagnosis: Pain Pain - Right/Left: Left Pain - part of body: Hip                Time: 1243-1310 OT Time Calculation (min): 27 min Charges:  OT General Charges $OT Visit: 1 Visit OT Evaluation $OT Eval Low Complexity: 1 Low OT Treatments $Self Care/Home Management : 8-22 mins  Lesle Chris, OTR/L Acute Rehabilitation Services [phone] [facility] pager [phone] office 01/28/2019  [place] 01/28/2019, 1:44 PM

## 2019-01-28 NOTE — Progress Notes (Signed)
RN notified Lacie Draft, PA of Troponin results and updated her on patient's condition.

## 2019-01-28 NOTE — Progress Notes (Signed)
Rn notified [place], [place] of CK total and CKMB results, and updated her on patient condition. Patient  Stable resting peacefully in bed denies SOB.  No orders received: RN will continue to monitor patient.

## 2019-01-28 NOTE — Progress Notes (Signed)
PT Cancellation Note  Patient Details Name: SABRIAN BADDELEY MRN: [id number] DOB: 12-26-1960   Cancelled Treatment:     attempted twice to see.  AM pt on BSC extended time with bowel issues.  PM pt working with OT.  Will attempt to see tomorrow if still here.    Rica Koyanagi  PTA Acute  Rehabilitation Services Pager      [phone] Office      [phone]

## 2019-01-28 NOTE — Progress Notes (Signed)
Patient c/o nausea, dry heaving, Rash on face hot, SOB, chest tightness, new L shoulder pain. Patient states she has never taken [facility] before and maybe that is the cause of her face rash. No fever, Vitals within normal limits. RN and Agricultural consultant at bedside. RN notified Jennye Moccasin, RRRN of the above symptoms. EKG performed and in chart. RRRN assessed patient. Patient has a history of crack and heroin use, states she has not used in a few years. CN suggested a Urine drug screen be performed d/t patietn's behavior, symptoms and history.   EKG showed QTc 591 and Non specific ST and Twave abnormalities.   Rn notified on call physician, Lacie Draft, PA of the above information.   Orders received: Troponin, CK total and CKMB, Urine Drug screen, COVID - 19 test (asymptomatic), RN to continue to monitor patient and call Lacie Draft, PA with abnormal lab results or change in status.

## 2019-01-29 MED ORDER — GUAIFENESIN ER 600 MG PO TB12
600.0000 mg | ORAL_TABLET | Freq: Two times a day (BID) | ORAL | Status: DC
  Administered 2019-01-29: 600 mg via ORAL
  Filled 2019-01-29: qty 1

## 2019-01-29 NOTE — Care Management Important Message (Signed)
Important Message  Patient Details  IM Letter given to Kathrin Greathouse SW to present to the Patient Name: MARIELLA MEHLHORN MRN: [id number] Date of Birth: 09/10/1960   Medicare Important Message Given:  Yes     Kerin Salen 01/29/2019, 1:06 PM

## 2019-01-29 NOTE — Progress Notes (Signed)
Physical Therapy Treatment Patient Details Name: Joan [name] MRN: [id number] DOB: 03/01/1960 Today's Date: 01/29/2019    History of Present Illness Patient is 58 y.o. female s/p Lt THR with PMH significant for HTN, hypothyroidism, GERD, COPD, DM, chronic pain, anxiety, depression, and OA.    PT Comments    POD #3 am session. Pt demonstrated good motivation during treatment. Pt was tired from seeing OT just prior to our treatment. Seated RA oxygen sats were 90%, while walking her oxygen sats got down to 84%. Pt required oxygen while exercising/ambulating. General bed mobility comments: Pt was able to lay back down in bed with supervision for safety. Pt was instructed on how to use the gait belt to help get her leg on and off the bed General transfer comment: Pt was able to perform a sit to stand with supervision for safety General Gait Details: Pt required VC's for sequencing of gait and distance to the walker. Pt was CGA for safety General stair comments: Pt was educated on how to use the waker to get up the stairs. "up with the good, down with the bad" Pt stated that she would not be using the walker when she got home, that she would be using a cane. THP were given and reviewed. THA HEP was given and reviewed. Pt stated that she understood all exercises.    Follow Up Recommendations  SNF;Supervision for mobility/OOB     Equipment Recommendations  None recommended by PT    Recommendations for Other Services       Precautions / Restrictions Precautions Precautions: Fall;Posterior Hip Precaution Booklet Issued: Yes (comment) Precaution Comments: THP reviewed Restrictions Weight Bearing Restrictions: No LLE Weight Bearing: Weight bearing as tolerated    Mobility  Bed Mobility Overal bed mobility: Modified Independent Bed Mobility: Supine to Sit     Supine to sit: Supervision     General bed mobility comments: Pt was able to lay back down in bed with supervision for safety. Pt  was instructed on how to use the gait belt to help get her leg on and off the bed  Transfers Overall transfer level: Modified independent Equipment used: Rolling walker (2 wheeled) Transfers: Sit to/from Stand Sit to Stand: Supervision         General transfer comment: Pt was able to perform a sit to stand with supervision for safety  Ambulation/Gait Ambulation/Gait assistance: Min guard Gait Distance (Feet): 30 Feet Assistive device: Rolling walker (2 wheeled) Gait Pattern/deviations: Step-to pattern;Decreased stride length;Decreased stance time - left;Decreased step length - right;Decreased weight shift to left     General Gait Details: Pt required VC's for sequencing of gait and distance to the walker. Pt was CGA for safety   Stairs Stairs: Yes Stairs assistance: Min guard;Min assist Stair Management: No rails;Forwards;Step to pattern;With walker Number of Stairs: 2 General stair comments: Pt was educated on how to use the waker to get up the stairs. "up with the good, down with the bad" Pt stated that she would not be using the walker when she got home, that she would be using a cane   Wheelchair Mobility    Modified Rankin (Stroke Patients Only)       Balance                                            Cognition Arousal/Alertness: Awake/alert Behavior During Therapy:  Impulsive Overall Cognitive Status: No family/caregiver present to determine baseline cognitive functioning                                 General Comments: reinforcement for posterior precautions.  Improved attention to task, but tends to do things automatically, resulting in decreased compliance      Exercises Total Joint Exercises Ankle Circles/Pumps: AROM;Both;15 reps;Supine Long Arc Quad: AROM;Left;5 reps;Seated    General Comments        Pertinent Vitals/Pain Pain Assessment: 0-10 Pain Score: 3  Faces Pain Scale: Hurts little more Pain Location: L  hip and buttocks and legs Pain Descriptors / Indicators: Aching;Sore;Discomfort Pain Intervention(s): Monitored during session;Repositioned;Ice applied    Home Living Family/patient expects to be discharged to:: Private residence Living Arrangements: Other relatives                  Prior Function            PT Goals (current goals can now be found in the care plan section) Progress towards PT goals: Progressing toward goals    Frequency    7X/week      PT Plan Current plan remains appropriate    Co-evaluation              AM-PAC PT "6 Clicks" Mobility   Outcome Measure  Help needed turning from your back to your side while in a flat bed without using bedrails?: A Little Help needed moving from lying on your back to sitting on the side of a flat bed without using bedrails?: A Little Help needed moving to and from a bed to a chair (including a wheelchair)?: A Little Help needed standing up from a chair using your arms (e.g., wheelchair or bedside chair)?: A Little Help needed to walk in hospital room?: A Little Help needed climbing 3-5 steps with a railing? : A Little 6 Click Score: 18    End of Session Equipment Utilized During Treatment: Gait belt Activity Tolerance: Patient tolerated treatment well Patient left: in bed;with call bell/phone within reach;with bed alarm set Nurse Communication: Mobility status PT Visit Diagnosis: Muscle weakness (generalized) (M62.81);Difficulty in walking, not elsewhere classified (R26.2)     Time: [id number] PT Time Calculation (min) (ACUTE ONLY): 29 min  Charges:  $Gait Training: 8-22 mins $Therapeutic Activity: 8-22 mins                     Excell Seltzer, [facility]

## 2019-01-29 NOTE — Progress Notes (Signed)
Occupational Therapy Treatment Patient Details Name: Joan [name] MRN: [id number] DOB: 24-Oct-1960 Today's Date: 01/29/2019    History of present illness Patient is 58 y.o. female s/p Lt THR with PMH significant for HTN, hypothyroidism, GERD, COPD, DM, chronic pain, anxiety, depression, and OA.   OT comments  Pt is progressing with OT goals.  She has used AE in the past; needed only one cue for shoehorn.  She does tend to bend in sitting to reach for items out of her reach. Encouraged her to use reacher, make sure things are within arms reach or get up to get these.  Follow Up Recommendations  Home health OT;Follow surgeon's recommendation for DC plan and follow-up therapies    Equipment Recommendations  None recommended by OT    Recommendations for Other Services      Precautions / Restrictions Precautions Precautions: Fall;Posterior Hip Precaution Booklet Issued: Yes (comment) Precaution Comments: THP reviewed Restrictions LLE Weight Bearing: Weight bearing as tolerated       Mobility Bed Mobility               General bed mobility comments: supervision for in/out of bed. Pt did have [place] raised and used rail.  She did use gaitbelt to self assist leg back to bed  Transfers   Equipment used: Rolling walker (2 wheeled)   Sit to Stand: Supervision              Balance                                           ADL either performed or assessed with clinical judgement   ADL Overall ADL's : Needs assistance/impaired Eating/Feeding: Independent   Grooming: Wash/dry hands;Wash/dry face;Supervision/safety;Standing   Upper Body Bathing: Set up   Lower Body Bathing: Moderate assistance;Sit to/from stand Lower Body Bathing Details (indicate cue type and reason): did not want to use long sponge nor reacher--requested assistance Upper Body Dressing : Set up   Lower Body Dressing: Supervision/safety;Sit to/from stand Lower Body Dressing Details  (indicate cue type and reason): used reacher, sock aide and long shoehorn.  One cue to not IR when using shoehorn Toilet Transfer: Supervision/safety;BSC;RW;Ambulation   Toileting- Water quality scientist and Hygiene: Supervision/safety;Sit to/from stand         General ADL Comments: performed ADL and toileting.  Pt tends to bend forward when sitting when things are not within her reach.  She also reached down to throw something into garbage.  Mostly able to use AE on her own, however, she needed one cue for shoehorn to avoid IR     Vision       Perception     Praxis      Cognition Arousal/Alertness: Awake/alert Behavior During Therapy: Impulsive Overall Cognitive Status: No family/caregiver present to determine baseline cognitive functioning                                 General Comments: reinforcement for posterior precautions.  Improved attention to task, but tends to do things automatically, resulting in decreased compliance        Exercises     Shoulder Instructions       General Comments      Pertinent Vitals/ Pain       Faces Pain Scale: Hurts little more Pain Location: L  hip and buttocks and legs Pain Descriptors / Indicators: Aching;Sore Pain Intervention(s): Limited activity within patient's tolerance;Monitored during session;Repositioned  Home Living Family/patient expects to be discharged to:: Private residence Living Arrangements: Other relatives                                      Prior Functioning/Environment              Frequency  Min 2X/week        Progress Toward Goals  OT Goals(current goals can now be found in the care plan section)  Progress towards OT goals: Progressing toward goals     Plan      Co-evaluation                 AM-PAC OT "6 Clicks" Daily Activity     Outcome Measure   Help from another person eating meals?: None Help from another person taking care of personal  grooming?: A Little Help from another person toileting, which includes using toliet, bedpan, or urinal?: A Little Help from another person bathing (including washing, rinsing, drying)?: A Little Help from another person to put on and taking off regular upper body clothing?: A Little Help from another person to put on and taking off regular lower body clothing?: A Little 6 Click Score: 19    End of Session    OT Visit Diagnosis: Pain Pain - Right/Left: Left Pain - part of body: Hip   Activity Tolerance Patient tolerated treatment well   Patient Left (in bathroom on BSC wtih NT)   Nurse Communication          [id number] OT Time Calculation (min): 42 min  Charges: OT General Charges $OT Visit: 1 Visit OT Treatments $Self Care/Home Management : 38-52 mins  Lesle Chris, OTR/L Acute Rehabilitation Services [phone] [facility] pager [phone] office 01/29/2019   [place] 01/29/2019, 10:28 AM

## 2019-01-29 NOTE — Progress Notes (Addendum)
     Subjective: 3 Days Post-Op Procedure(s) (LRB): TOTAL HIP REVISION (Left)   Patient reports pain as mild, with regards to the hip.  No reported events throughout the night.  Patient is very appreciative of the nurse Sigourney working with her yesterday to have a bowel movement.  Patient stated took all day but she eventually did have 1 after the nurse did a disimpaction.  Patient feels much better now, we discussed the use of MiraLAX and Colace and if those do not work she has been previously prescribed medicine from her doctor that causes her to have a bowel movement.  Patient is very happy with the length of her leg and feeling that they are even.  Patient states that she is ready to go home.  Patient follow-up in the clinic in 2 weeks.  Patient is to call with any questions or concerns.   Objective:   VITALS:   Vitals:   01/29/19 0514 01/29/19 0732  BP: 140/75   Pulse: 93   Resp: 19   Temp: 97.9 F (36.6 C)   SpO2: 94% (!) 78%    Dorsiflexion/Plantar flexion intact Incision: dressing C/D/I No cellulitis present Compartment soft  LABS Recent Labs    01/27/19 0242 01/28/19 0211  HGB 12.3 11.3*  HCT 36.7 34.4*  WBC 12.2* 12.9*  PLT 205 254    Recent Labs    01/27/19 0242 01/28/19 0211  NA 135 138  K 3.6 3.2*  BUN 15 22*  CREATININE 1.20* 0.92  GLUCOSE 376* 172*     Assessment/Plan: 3 Days Post-Op Procedure(s) (LRB): TOTAL HIP REVISION (Left) Up with therapy Discharge home with home health  Follow up in 2 weeks at [facility] Follow up with OLIN,Renan Danese D in 2 weeks.  Contact information:  [facility] [address], [address] [place] Jenalee Trevizo   PAC  01/29/2019, 7:53 AM

## 2019-01-31 LAB — URINE DRUGS OF ABUSE SCREEN W ALC, ROUTINE (REF LAB)
Amphetamines, Urine: NEGATIVE ng/mL
Barbiturate, Ur: NEGATIVE ng/mL
Benzodiazepine Quant, Ur: NEGATIVE ng/mL
Cannabinoid Quant, Ur: NEGATIVE ng/mL
Cocaine (Metab.): NEGATIVE ng/mL
Ethanol U, Quan: NEGATIVE %
Methadone Screen, Urine: NEGATIVE ng/mL
Phencyclidine, Ur: NEGATIVE ng/mL
Propoxyphene, Urine: NEGATIVE ng/mL

## 2019-01-31 LAB — OPIATES CONFIRMATION, URINE: OPIATES: NEGATIVE

## 2019-02-01 ENCOUNTER — Ambulatory Visit: Payer: Medicare Other | Admitting: Acute Care

## 2019-02-02 ENCOUNTER — Ambulatory Visit (INDEPENDENT_AMBULATORY_CARE_PROVIDER_SITE_OTHER): Payer: Medicare Other | Admitting: Psychiatry

## 2019-02-02 ENCOUNTER — Encounter (HOSPITAL_COMMUNITY): Payer: Self-pay | Admitting: Psychiatry

## 2019-02-02 DIAGNOSIS — R443 Hallucinations, unspecified: Secondary | ICD-10-CM | POA: Diagnosis not present

## 2019-02-02 DIAGNOSIS — F4312 Post-traumatic stress disorder, chronic: Secondary | ICD-10-CM

## 2019-02-02 DIAGNOSIS — F411 Generalized anxiety disorder: Secondary | ICD-10-CM | POA: Diagnosis not present

## 2019-02-02 DIAGNOSIS — F0630 Mood disorder due to known physiological condition, unspecified: Secondary | ICD-10-CM | POA: Diagnosis not present

## 2019-02-02 MED ORDER — ARIPIPRAZOLE 2 MG PO TABS: 2.0000 mg | ORAL_TABLET | Freq: Every day | ORAL | 0 refills | Status: DC

## 2019-02-02 NOTE — Discharge Summary (Signed)
Physician Discharge Summary  Patient ID: Joan [name] MRN: [id number] DOB/AGE: February 09, 1961 58 y.o.  Admit date: 01/26/2019 Discharge date: 01/29/2019   Procedures:  Procedure(s) (LRB): TOTAL HIP REVISION (Left)  Attending Physician:  Dr. Durene Romans   Admission Diagnoses:   Left hip pain s/p THA with shortening  Discharge Diagnoses:  Principal Problem:   S/P left TH revision Active Problems:   Obese  Past Medical History:  Diagnosis Date  . Acute respiratory failure with hypoxia (HCC)   . Alcoholism and drug addiction in family   . Anxiety   . Arthritis    "knees, hips, lower back" (09/25/2016)  . Asthma   . Bipolar disorder (HCC)   . Chronic bronchitis (HCC)    "all the time" (09/25/2016)  . Chronic leg pain    RLE  . Chronic lower back pain   . COPD (chronic obstructive pulmonary disease) (HCC)   . Depression   . Diabetes mellitus without complication (HCC)   . Emphysema of lung (HCC)   . GERD (gastroesophageal reflux disease)   . Headache   . Hepatitis C    "treated back in 2001-2002"  . High cholesterol   . Hypertension   . Hypothyroidism   . Incarcerated ventral hernia 04/04/2017  . On home oxygen therapy    "2L; at nighttime" (04/04/2017)  . OSA on CPAP    CPAP with Oxygen 2 liters  . Pneumonia    "all the time" (09/25/2016)  . Positive PPD    with negative chest x ray  . Tachycardia    patient reports with exertion ; denies any other conditions   . Thyroid disease     HPI:    Joan [name], 57 y.o. female, has a history of pain and functional disability in the left hip due to failure / shortening and patient has failed non-surgical conservative treatments for greater than 12 weeks to include NSAID's and/or analgesics, use of assistive devices and activity modification. The indications for the revision total hip arthroplasty are shortnening. Onset of symptoms was gradual starting 2019 with gradually worsening course since that time.  Prior procedures on the  left hip include arthroplasty. Patient currently rates pain in the left hip at 9 out of 10 with activity.  There is worsening of pain with activity and weight bearing, trendelenberg gait, pain that interfers with activities of daily living and pain with passive range of motion. Patient has evidence of shortening  by imaging studies.  This condition presents safety issues increasing the risk of falls.  There is no current active infection.  Risks, benefits and expectations were discussed with the patient.  Risks including but not limited to the risk of anesthesia, blood clots, nerve damage, blood vessel damage, failure of the prosthesis, infection and up to and including death.  Patient understand the risks, benefits and expectations and wishes to proceed with surgery.   PCP: Myrlene Broker, MD   Discharged Condition: good  Hospital Course:  Patient underwent the above stated procedure on 01/26/2019. Patient tolerated the procedure well and brought to the recovery room in good condition and subsequently to the floor.  POD #1 BP: 119/62 ; Pulse: 72 ; Temp: 97.8 F (36.6 C) ; Resp: 18 Patient reports pain as mild, pain controlled.  No events throughout the night. Dr. Charlann Boxer discussed the procedure, findings and expectations moving forward.  Due to safety concern she was not discharged home for further monitoring and work with PT.  Dorsiflexion/plantar flexion  intact, incision: dressing C/D/I, no cellulitis present and compartment soft.   LABS  Basename    HGB     12.3  HCT     36.7   POD #2  BP: 152/82 ; Pulse: 97 ; Temp: 98.1 F (36.7 C) ; Resp: 18 Patient reports pain as mild.  Soreness around left hip.  Nausea episode noted, no bowel activity since pre-op.  Sore throat likely related to intubation from surgery. Neurovascular intact and incision: dressing C/D/I.   LABS  Basename    HGB     11.3  HCT     34.4   POD #3  BP: 140/75 ; Pulse: 93 ; Temp: 97.9 F (36.6 C) ; Resp: 19  Patient reports pain as mild, with regards to the hip.  No reported events throughout the night.  Patient is very appreciative of the nurse Lancie working with her yesterday to have a bowel movement.  Patient stated took all day but she eventually did have 1 after the nurse did a disimpaction.  Patient feels much better now, we discussed the use of MiraLAX and Colace and if those do not work she has been previously prescribed medicine from her doctor that causes her to have a bowel movement.  Patient is very happy with the length of her leg and feeling that they are even.  Patient states that she is ready to go home.  Patient follow-up in the clinic in 2 weeks.  Patient is to call with any questions or concerns. Dorsiflexion/plantar flexion intact, incision: dressing C/D/I, no cellulitis present and compartment soft.   LABS   No new labs   Discharge Exam: General appearance: alert, cooperative and no distress Extremities: Homans sign is negative, no sign of DVT, no edema, redness or tenderness in the calves or thighs and no ulcers, gangrene or trophic changes  Disposition:  Home with follow up in 2 weeks   Follow-up Information    Durene Romans, MD. Schedule an appointment as soon as possible for a visit in 2 weeks.   Specialty: Orthopedic Surgery Contact information: [address] [place] [address] [place] [address] [phone]           Discharge Instructions    Call MD / Call 911   Complete by: As directed    If you experience chest pain or shortness of breath, CALL 911 and be transported to the hospital emergency room.  If you develope a fever above 101 F, pus (white drainage) or increased drainage or redness at the wound, or calf pain, call your surgeon's office.   Change dressing   Complete by: As directed    Maintain surgical dressing until follow up in the clinic. If the edges start to pull up, may reinforce with tape. If the dressing is no longer working, may remove and  cover with gauze and tape, but must keep the area dry and clean.  Call with any questions or concerns.   Constipation Prevention   Complete by: As directed    Drink plenty of fluids.  Prune juice may be helpful.  You may use a stool softener, such as Colace (over the counter) 100 mg twice a day.  Use MiraLax (over the counter) for constipation as needed.   Diet - low sodium heart healthy   Complete by: As directed    Discharge instructions   Complete by: As directed    Maintain surgical dressing until follow up in the clinic. If the edges start to pull  up, may reinforce with tape. If the dressing is no longer working, may remove and cover with gauze and tape, but must keep the area dry and clean.  Follow up in 2 [facility] at [facility]. Call with any questions or concerns.   Follow the hip precautions as taught in Physical Therapy   Complete by: As directed    Increase activity slowly as tolerated   Complete by: As directed    Weight bearing as tolerated with assist device (walker, cane, etc) as directed, use it as long as suggested by your surgeon or therapist, typically at least 4-6 weeks.   TED hose   Complete by: As directed    Use stockings (TED hose) for 2 weeks on both leg(s).  You may remove them at night for sleeping.      Allergies as of 01/29/2019      Reactions   Keflex [cephalexin] Other (See Comments)   unspecified   Prednisone Other (See Comments)   "counter reacts"   Shellfish Allergy Other (See Comments)   Stomach cramps   Tetracyclines & Related Other (See Comments)   unspecified   Dilaudid [hydromorphone Hcl] Other (See Comments)   "Lethargy"   Incruse Ellipta [umeclidinium Bromide] Nausea Only   Tuberculin Tests Other (See Comments)   False postive      Medication List    STOP taking these medications   linaclotide 145 MCG Caps capsule Commonly known as: LINZESS   metoprolol tartrate 100 MG tablet Commonly known as: LOPRESSOR   naproxen 500 MG  tablet Commonly known as: NAPROSYN   predniSONE 50 MG tablet Commonly known as: DELTASONE     TAKE these medications   acyclovir 400 MG tablet Commonly known as: ZOVIRAX Take 1 tablet (400 mg total) by mouth 2 (two) times daily. What changed: when to take this   albuterol (2.5 MG/3ML) 0.083% nebulizer solution Commonly known as: PROVENTIL Take 3 mLs (2.5 mg total) by nebulization 2 (two) times daily. And every 6 hours as needed.  DX: J44.9 What changed:   when to take this  reasons to take this   atenolol 25 MG tablet Commonly known as: TENORMIN Take 1 tablet (25 mg total) by mouth daily.   atorvastatin 20 MG tablet Commonly known as: LIPITOR Take 1 tablet (20 mg total) by mouth daily.   budesonide-formoterol 160-4.5 MCG/ACT inhaler Commonly known as: Symbicort Inhale 2 puffs into the lungs 2 (two) times daily.   buPROPion 150 MG 24 hr tablet Commonly known as: Wellbutrin XL Take 1 tablet (150 mg total) by mouth 2 (two) times daily. What changed:   how much to take  when to take this   Chantix Starting Month Pak 0.5 MG X 11 & 1 MG X 42 tablet Generic drug: varenicline Take one 0.5 mg tablet by mouth once daily for 3 days, then increase to one 0.5 mg tablet twice daily for 4 days, then increase to one 1 mg tablet twice daily. What changed:   how much to take  how to take this  when to take this  additional instructions   cyclobenzaprine 10 MG tablet Commonly known as: FLEXERIL Take 1 tablet (10 mg total) by mouth at bedtime.   Daliresp 500 MCG Tabs tablet Generic drug: roflumilast Take 1 tablet (500 mcg total) by mouth daily.   diphenhydrAMINE 25 MG tablet Commonly known as: BENADRYL Take 25 mg by mouth 2 (two) times daily.   docusate sodium 100 MG capsule Commonly known as: Colace  Take 1 capsule (100 mg total) by mouth 2 (two) times daily.   ferrous sulfate 325 (65 FE) MG tablet Commonly known as: FerrouSul Take 1 tablet (325 mg total) by  mouth 3 (three) times daily with meals for 14 days.   gabapentin 300 MG capsule Commonly known as: NEURONTIN TAKE 1 CAPSULE BY MOUTH  TWICE DAILY What changed:   how much to take  when to take this   hydrochlorothiazide 25 MG tablet Commonly known as: HYDRODIURIL Take 1 tablet (25 mg total) by mouth daily.   HYDROcodone-acetaminophen 7.5-325 MG tablet Commonly known as: Norco Take 1-2 tablets by mouth every 4 (four) hours as needed for moderate pain.   Ipratropium-Albuterol 20-100 MCG/ACT Aers respimat Commonly known as: Combivent Respimat Inhale 1 puff into the lungs every 6 (six) hours as needed for wheezing.   levothyroxine 25 MCG tablet Commonly known as: SYNTHROID Take 1 tablet (25 mcg total) by mouth daily before breakfast.   montelukast 10 MG tablet Commonly known as: SINGULAIR Take 1 tablet (10 mg total) by mouth at bedtime.   nicotine 14 mg/24hr patch Commonly known as: Nicoderm CQ Place 1 patch (14 mg total) onto the skin daily.   nystatin-triamcinolone ointment Commonly known as: MYCOLOG Apply 1 application topically 2 (two) times daily as needed (applied to skin folds/groins/under breast skin irritation rash.).   OXYGEN Inhale 2 L into the lungs at bedtime. May use 2.5 - 3 L during the day with strenuous activity Plugged it to CPAP   pantoprazole 40 MG tablet Commonly known as: PROTONIX Take 1 tablet (40 mg total) by mouth daily. What changed: when to take this   polyethylene glycol 17 g packet Commonly known as: MIRALAX / GLYCOLAX Take 17 g by mouth 2 (two) times daily.   rivaroxaban 10 MG Tabs tablet Commonly known as: Xarelto Take 1 tablet (10 mg total) by mouth daily.   SALONPAS ARTHRITIS PAIN RELIEF EX Apply 1 patch topically daily as needed (pain).   traZODone 100 MG tablet Commonly known as: DESYREL TAKE 1 TABLET BY MOUTH AT  BEDTIME   venlafaxine XR 150 MG 24 hr capsule Commonly known as: EFFEXOR-XR TAKE 1 CAPSULE BY MOUTH  DAILY  WITH BREAKFAST What changed:   how much to take  how to take this  when to take this   WOMENS 50+ MULTI VITAMIN/MIN PO Take 1 tablet by mouth daily.            Discharge Care Instructions  (From admission, onward)         Start     Ordered   01/27/19 0000  Change dressing    Comments: Maintain surgical dressing until follow up in the clinic. If the edges start to pull up, may reinforce with tape. If the dressing is no longer working, may remove and cover with gauze and tape, but must keep the area dry and clean.  Call with any questions or concerns.   01/27/19 6578           Signed: Anastasio Auerbach. Akio Hudnall   PA-C  02/02/2019, 8:31 AM

## 2019-02-02 NOTE — Progress Notes (Signed)
Psychiatric Initial Adult Assessment   Patient Identification: Joan [name] MRN:  [id number] Date of Evaluation:  02/02/2019 Referral Source:Therapist [name] Chief Complaint:   Chief Complaint    Depression; Establish Care     Visit Diagnosis:    ICD-10-CM   1. Post-traumatic stress disorder, chronic  F43.12   2. GAD (generalized anxiety disorder)  F41.1   3. Mood disorder in conditions classified elsewhere  F06.30   4. Hallucinations  R44.3    I connected with Joan [name] on 02/02/19 at 11:00 AM EST by a video enabled telemedicine application and verified that I am speaking with the correct person using two identifiers.   I discussed the limitations of evaluation and management by telemedicine and the availability of in person appointments. The patient expressed understanding and agreed to proceed. History of Present Illness: Patient is a 58 years old currently widow Caucasian female referred by her therapist for management of mood symptoms, depression possible PTSD and anxiety  She is in bed because of hip replacement surgery currently she is in the bed and we did a video conference Patient states long history of drug use including cocaine marijuana and alcohol use that she stopped nearly 8 years ago.  She has been incarcerated and involuntarily committed by her husband at that time to get sober.  During that period of time he died and when patient was released since she did not attend the funeral she still felt as if he was alive.  Since then she has been having hallucination including seeing dead people somewhat distant voices not having any command hallucinations also seeing her dead husband.  He was in [place] and his kids were sending him the money  In the past he denies having clear hallucinations but she was actively using drugs and high use of cocaine and marijuana she feels her visual hallucinations started after the death of her husband.  Also she feels paranoia as if  there is a cat in the room or something she feels inside the mattress at nighttime  She is suffering from grief as above and trauma when growing up says lot of stress, does not remember her childhood. Had seen therapist when young  She does endorse episodes of depression, withdrawn decrease energy. Says depression all adult life She has used extensive drugs while married with first husband and she has 3 kids at early age but they were taken away by husband due to her drug use That was traumatizing, she was able to get them back when they grew  Up and her sister had her custody  She endorses worries, excessive, related to her physical health, grandkids, finances  Aggravating factor; difficult childhood, Grief, difficult relationship with middle kid, hip replacement Modifying factor: sister and brother, but still has difficult time with them   Duration adult life Severity of depression: denies hopelessness Main concern is paranoia and hallucinations . Denies if she was having when young but more so during last 8 years after her second husband death.    Past Psychiatric History: depression, drug use  Previous Psychotropic Medications: Yes   Substance Abuse History in the last 12 months:  No.  Consequences of Substance Abuse: history of extensive drug use uptil 8 years ago and have been in rehab programs  Past Medical History:  Past Medical History:  Diagnosis Date  . Acute respiratory failure with hypoxia ([place])   . Alcoholism and drug addiction in family   . Anxiety   . Arthritis    "  knees, hips, lower back" (09/25/2016)  . Asthma   . Bipolar disorder ([place])   . Chronic bronchitis ([place])    "all the time" (09/25/2016)  . Chronic leg pain    RLE  . Chronic lower back pain   . COPD (chronic obstructive pulmonary disease) ([place])   . Depression   . Diabetes mellitus without complication ([place])   . Emphysema of lung ([place])   . GERD (gastroesophageal reflux disease)   . Headache   .  Hepatitis C    "treated back in 2001-2002"  . High cholesterol   . Hypertension   . Hypothyroidism   . Incarcerated ventral hernia 04/04/2017  . On home oxygen therapy    "2L; at nighttime" (04/04/2017)  . OSA on CPAP    CPAP with Oxygen 2 liters  . Pneumonia    "all the time" (09/25/2016)  . Positive PPD    with negative chest x ray  . Tachycardia    patient reports with exertion ; denies any other conditions   . Thyroid disease     Past Surgical History:  Procedure Laterality Date  . BACK SURGERY    . BIOPSY  11/24/2018   Procedure: BIOPSY;  Surgeon: Thornton Park, MD;  Location: [facility];  Service: Gastroenterology;;  . CARDIAC CATHETERIZATION    . COLONOSCOPY    . COLONOSCOPY WITH PROPOFOL N/A 11/24/2018   Procedure: COLONOSCOPY WITH PROPOFOL;  Surgeon: Thornton Park, MD;  Location: [facility];  Service: Gastroenterology;  Laterality: N/A;  . CYST EXCISION     removal of cyst in sinuses  . DILATION AND CURETTAGE OF UTERUS    . ESOPHAGOGASTRODUODENOSCOPY (EGD) WITH PROPOFOL N/A 11/24/2018   Procedure: ESOPHAGOGASTRODUODENOSCOPY (EGD) WITH PROPOFOL;  Surgeon: Thornton Park, MD;  Location: [facility];  Service: Gastroenterology;  Laterality: N/A;  . FOOT SURGERY Bilateral    bone removed from 4th and 5 th toes and put in pin then took pin out  . HERNIA REPAIR    . INCISION AND DRAINAGE  ~ 2014/2015   "removed mesh & infection"  . INCISIONAL HERNIA REPAIR N/A 04/04/2017   Procedure: LAPAROSCOPIC INCISIONAL HERNIA REPAIR WITH MESH;  Surgeon: Fanny Skates, MD;  Location: [facility];  Service: General;  Laterality: N/A;  . LACERATION REPAIR Right    repair wrist artery from laceration from ice skate  . LAPAROSCOPIC INCISIONAL / UMBILICAL / VENTRAL HERNIA REPAIR  04/04/2017   LAPAROSCOPIC INCISIONAL HERNIA REPAIR WITH MESH  . LIPOMA EXCISION Right    fattty lipoma in neck  . NASAL SEPTUM SURGERY    . POSTERIOR LUMBAR FUSION  2015/2016 X 2   "added rods and screws"   . TOTAL HIP ARTHROPLASTY Right 02/05/2017   Procedure: RIGHT TOTAL HIP ARTHROPLASTY;  Surgeon: [name], MD;  Location: [facility];  Service: Orthopedics;  Laterality: Right;  . TOTAL HIP ARTHROPLASTY Left 06/18/2017   Procedure: LEFT TOTAL HIP ARTHROPLASTY;  Surgeon: [name], MD;  Location: [facility];  Service: Orthopedics;  Laterality: Left;  . TOTAL HIP REVISION Left 01/26/2019   Procedure: TOTAL HIP REVISION;  Surgeon: Paralee Cancel, MD;  Location: [facility];  Service: Orthopedics;  Laterality: Left;  2 hrs  . TUBAL LIGATION    . UMBILICAL HERNIA REPAIR  ~ 2013   w/mesh  . UPPER GI ENDOSCOPY      Family Psychiatric History: FAther : alcohol use  Family History:  Family History  Problem Relation Age of Onset  . Diabetes Mother   . Hypertension Mother   .  Hypertension Father   . Cancer Father        lung    Social History:   Social History   Socioeconomic History  . Marital status: Widowed    Spouse name: Not on file  . Number of children: 3  . Years of education: Not on file  . Highest education level: Not on file  Occupational History  . Not on file  Social Needs  . Financial resource strain: Somewhat hard  . Food insecurity    Worry: Never true    Inability: Never true  . Transportation needs    Medical: No    Non-medical: No  Tobacco Use  . Smoking status: Former Smoker    Packs/day: 1.00    Years: 44.00    Pack years: 44.00    Types: Cigarettes    Quit date: 11/30/2018    Years since quitting: 0.1  . Smokeless tobacco: Never Used  . Tobacco comment: Smoker 1-1.5 PPD every day, 11-20-2018 "i got myself down to half a pack", i am gonna be going on chantix on the 28th"  Substance and Sexual Activity  . Alcohol use: Not Currently  . Drug use: Not Currently    Types: "Crack" cocaine    Comment: 04/04/2017 "nothing since 01/12/2012"  . Sexual activity: Never  Lifestyle  . Physical activity    Days per week: 0 days    Minutes per session: 0 min  .  Stress: To some extent  Relationships  . Social Herbalist on phone: Three times a week    Gets together: Once a week    Attends religious service: Never    Active member of club or organization: No    Attends meetings of clubs or organizations: Never    Relationship status: Widowed  Other Topics Concern  . Not on file  Social History Narrative  . Not on file    Additional Social History: grew up with parents. Had siblings, difficult growing up and not clear memories. Had seen therapist when young. Did not have too many friends Married 2 times.   Allergies:   Allergies  Allergen Reactions  . Keflex [Cephalexin] Other (See Comments)    unspecified  . Prednisone Other (See Comments)    "counter reacts"  . Shellfish Allergy Other (See Comments)    Stomach cramps  . Tetracyclines & Related Other (See Comments)    unspecified  . Dilaudid [Hydromorphone Hcl] Other (See Comments)    "Lethargy"  . Incruse Ellipta [Umeclidinium Bromide] Nausea Only  . Tuberculin Tests Other (See Comments)    False postive    Metabolic Disorder Labs: Lab Results  Component Value Date   HGBA1C 6.7 (H) 12/17/2018   MPG 154.2 05/22/2018   MPG 128.37 06/11/2017   No results found for: PROLACTIN Lab Results  Component Value Date   CHOL 125 12/17/2018   TRIG 261.0 (H) 12/17/2018   HDL 37.80 (L) 12/17/2018   CHOLHDL 3 12/17/2018   VLDL 52.2 (H) 12/17/2018   LDLCALC 62 08/19/2017   Lab Results  Component Value Date   TSH 0.251 (L) 12/29/2017    Therapeutic Level Labs: No results found for: LITHIUM No results found for: CBMZ No results found for: VALPROATE  Current Medications: Current Outpatient Medications  Medication Sig Dispense Refill  . acyclovir (ZOVIRAX) 400 MG tablet Take 1 tablet (400 mg total) by mouth 2 (two) times daily. (Patient taking differently: Take 400 mg by mouth daily. ) 180 tablet 3  .  albuterol (PROVENTIL) (2.5 MG/3ML) 0.083% nebulizer solution Take 3  mLs (2.5 mg total) by nebulization 2 (two) times daily. And every 6 hours as needed.  DX: J44.9 (Patient taking differently: Take 2.5 mg by nebulization 2 (two) times daily as needed. And every 6 hours as needed.  DX: J44.9) 360 mL 3  . ARIPiprazole (ABILIFY) 2 MG tablet Take 1 tablet (2 mg total) by mouth daily. 30 tablet 0  . atenolol (TENORMIN) 25 MG tablet Take 1 tablet (25 mg total) by mouth daily. 90 tablet 3  . atorvastatin (LIPITOR) 20 MG tablet Take 1 tablet (20 mg total) by mouth daily. 90 tablet 3  . budesonide-formoterol (SYMBICORT) 160-4.5 MCG/ACT inhaler Inhale 2 puffs into the lungs 2 (two) times daily. 2 Inhaler 0  . buPROPion (WELLBUTRIN XL) 150 MG 24 hr tablet Take 1 tablet (150 mg total) by mouth 2 (two) times daily. (Patient taking differently: Take 300 mg by mouth daily. ) 180 tablet 3  . cyclobenzaprine (FLEXERIL) 10 MG tablet Take 1 tablet (10 mg total) by mouth at bedtime. 90 tablet 1  . diphenhydrAMINE (BENADRYL) 25 MG tablet Take 25 mg by mouth 2 (two) times daily.     Marland Kitchen docusate sodium (COLACE) 100 MG capsule Take 1 capsule (100 mg total) by mouth 2 (two) times daily. 28 capsule 0  . ferrous sulfate (FERROUSUL) 325 (65 FE) MG tablet Take 1 tablet (325 mg total) by mouth 3 (three) times daily with meals for 14 days. 42 tablet 0  . gabapentin (NEURONTIN) 300 MG capsule TAKE 1 CAPSULE BY MOUTH  TWICE DAILY (Patient taking differently: Take 600 mg by mouth at bedtime. ) 180 capsule 1  . hydrochlorothiazide (HYDRODIURIL) 25 MG tablet Take 1 tablet (25 mg total) by mouth daily. 90 tablet 3  . HYDROcodone-acetaminophen (NORCO) 7.5-325 MG tablet Take 1-2 tablets by mouth every 4 (four) hours as needed for moderate pain. 60 tablet 0  . Ipratropium-Albuterol (COMBIVENT RESPIMAT) 20-100 MCG/ACT AERS respimat Inhale 1 puff into the lungs every 6 (six) hours as needed for wheezing. 3 Inhaler 3  . levothyroxine (SYNTHROID, LEVOTHROID) 25 MCG tablet Take 1 tablet (25 mcg total) by mouth  daily before breakfast. 90 tablet 3  . Liniments (SALONPAS ARTHRITIS PAIN RELIEF EX) Apply 1 patch topically daily as needed (pain).    . montelukast (SINGULAIR) 10 MG tablet Take 1 tablet (10 mg total) by mouth at bedtime. 90 tablet 3  . Multiple Vitamins-Minerals (WOMENS 50+ MULTI VITAMIN/MIN PO) Take 1 tablet by mouth daily.    . nicotine (NICODERM CQ) 14 mg/24hr patch Place 1 patch (14 mg total) onto the skin daily. 28 patch 0  . nystatin-triamcinolone ointment (MYCOLOG) Apply 1 application topically 2 (two) times daily as needed (applied to skin folds/groins/under breast skin irritation rash.). 100 g 3  . OXYGEN Inhale 2 L into the lungs at bedtime. May use 2.5 - 3 L during the day with strenuous activity Plugged it to CPAP    . pantoprazole (PROTONIX) 40 MG tablet Take 1 tablet (40 mg total) by mouth daily. (Patient taking differently: Take 40 mg by mouth 2 (two) times daily. ) 90 tablet 3  . polyethylene glycol (MIRALAX / GLYCOLAX) 17 g packet Take 17 g by mouth 2 (two) times daily. 28 packet 0  . rivaroxaban (XARELTO) 10 MG TABS tablet Take 1 tablet (10 mg total) by mouth daily. 21 tablet 0  . roflumilast (DALIRESP) 500 MCG TABS tablet Take 1 tablet (500 mcg total) by  mouth daily. 90 tablet 3  . traZODone (DESYREL) 100 MG tablet TAKE 1 TABLET BY MOUTH AT  BEDTIME (Patient taking differently: Take 100 mg by mouth at bedtime. ) 90 tablet 3  . varenicline (CHANTIX STARTING MONTH PAK) 0.5 MG X 11 & 1 MG X 42 tablet Take one 0.5 mg tablet by mouth once daily for 3 days, then increase to one 0.5 mg tablet twice daily for 4 days, then increase to one 1 mg tablet twice daily. (Patient taking differently: Take 0.5 mg by mouth 2 (two) times daily. ) 53 tablet 0  . venlafaxine XR (EFFEXOR-XR) 150 MG 24 hr capsule TAKE 1 CAPSULE BY MOUTH  DAILY WITH BREAKFAST (Patient taking differently: Take 150 mg by mouth daily with breakfast. TAKE 1 CAPSULE BY MOUTH  DAILY WITH BREAKFAST) 90 capsule 3   No current  facility-administered medications for this visit.      Psychiatric Specialty Exam: Review of Systems  Cardiovascular: Negative for chest pain.  Musculoskeletal: Positive for joint pain.  Psychiatric/Behavioral: Positive for hallucinations. Negative for substance abuse and suicidal ideas.    There were no vitals taken for this visit.There is no height or weight on file to calculate BMI.  General Appearance: Casual  Eye Contact:  Fair  Speech:  Slow  Volume:  Decreased  Mood:  somewhat subdued  Affect:  Congruent  Thought Process:  Goal Directed  Orientation:  Full (Time, Place, and Person)  Thought Content:  Hallucinations: Auditory Tactile Visual  Suicidal Thoughts:  No  Homicidal Thoughts:  No  Memory:  Immediate;   Fair Recent;   Fair  Judgement:  Fair  Insight:  Shallow  Psychomotor Activity:  Decreased  Concentration:  Concentration: Fair and Attention Span: Fair  Recall:  [employer] of Knowledge:Fair  Language: Fair  Akathisia:  No  Handed:    AIMS (if indicated): no involuntary movements  Assets:  Desire for Improvement Leisure Time Social Support  ADL's: limited due to recent surgery  Cognition: WNL  Sleep:  variable   Screenings: Mini-Mental     Clinical Support from 04/06/2018 in [facility]  Total Score (max 30 points )  29    PHQ2-9     Patient Outreach Telephone from 12/18/2018 in [facility] Patient Outreach Telephone from 11/18/2018 in [facility] from 04/06/2018 in [facility] Visit from 11/28/2016 in [facility]  PHQ-2 Total Score  3  1  2  1   PHQ-9 Total Score  -  -  6  -      Assessment and Plan: as follows  Mood disorder unspecified: relavant to multiple medical concerns and past history of using drugs, alcohol Recurrent depressive episodes: on wellbutrin 150mg  has helped stopping smoking , can continue  Can continue  effexor  Hallucinations may be part of Grief, PTSD or depression or possible part of schizoaffective disorder. She feels hallucinations prominent since her husband death. This suggest possible related to grief or PTSD   Will start small dose of abilify 2mg  , it is the smallest dose, and can increase by next visit Risk side effects discussed Has support of her brother and case/care giver since discharge  She otherwise is cooperative, alert and not hopeless. Denies feeling suicidal has benefited from therapy to deal with family concerns and past issues.  Would recommend more frequent therapy for depression, grief     I discussed the assessment and treatment  plan with the patient. The patient was provided an opportunity to ask questions and all were answered. The patient agreed with the plan and demonstrated an understanding of the instructions.   The patient was advised to call back or seek an in-person evaluation if the symptoms worsen or if the condition fails to improve as anticipated.  I provided 50 minutes of non-face-to-face time during this encounter. FU 3-4 weeks or earlier if needed [name], MD 12/8/202011:35 AM

## 2019-02-03 DIAGNOSIS — J439 Emphysema, unspecified: Secondary | ICD-10-CM | POA: Diagnosis not present

## 2019-02-03 DIAGNOSIS — E785 Hyperlipidemia, unspecified: Secondary | ICD-10-CM | POA: Diagnosis not present

## 2019-02-03 DIAGNOSIS — K5900 Constipation, unspecified: Secondary | ICD-10-CM | POA: Diagnosis not present

## 2019-02-03 DIAGNOSIS — R911 Solitary pulmonary nodule: Secondary | ICD-10-CM | POA: Diagnosis not present

## 2019-02-03 DIAGNOSIS — I10 Essential (primary) hypertension: Secondary | ICD-10-CM | POA: Diagnosis not present

## 2019-02-03 DIAGNOSIS — G4733 Obstructive sleep apnea (adult) (pediatric): Secondary | ICD-10-CM | POA: Diagnosis not present

## 2019-02-03 DIAGNOSIS — Z96643 Presence of artificial hip joint, bilateral: Secondary | ICD-10-CM | POA: Diagnosis not present

## 2019-02-03 DIAGNOSIS — D509 Iron deficiency anemia, unspecified: Secondary | ICD-10-CM | POA: Diagnosis not present

## 2019-02-03 DIAGNOSIS — Z471 Aftercare following joint replacement surgery: Secondary | ICD-10-CM | POA: Diagnosis not present

## 2019-02-03 DIAGNOSIS — I2510 Atherosclerotic heart disease of native coronary artery without angina pectoris: Secondary | ICD-10-CM | POA: Diagnosis not present

## 2019-02-03 DIAGNOSIS — E039 Hypothyroidism, unspecified: Secondary | ICD-10-CM | POA: Diagnosis not present

## 2019-02-03 DIAGNOSIS — I119 Hypertensive heart disease without heart failure: Secondary | ICD-10-CM | POA: Diagnosis not present

## 2019-02-04 ENCOUNTER — Telehealth (HOSPITAL_COMMUNITY): Payer: Self-pay | Admitting: Psychiatry

## 2019-02-04 DIAGNOSIS — E785 Hyperlipidemia, unspecified: Secondary | ICD-10-CM | POA: Diagnosis not present

## 2019-02-04 DIAGNOSIS — J439 Emphysema, unspecified: Secondary | ICD-10-CM | POA: Diagnosis not present

## 2019-02-04 DIAGNOSIS — E039 Hypothyroidism, unspecified: Secondary | ICD-10-CM | POA: Diagnosis not present

## 2019-02-04 DIAGNOSIS — I10 Essential (primary) hypertension: Secondary | ICD-10-CM | POA: Diagnosis not present

## 2019-02-04 DIAGNOSIS — Z471 Aftercare following joint replacement surgery: Secondary | ICD-10-CM | POA: Diagnosis not present

## 2019-02-04 DIAGNOSIS — Z96643 Presence of artificial hip joint, bilateral: Secondary | ICD-10-CM | POA: Diagnosis not present

## 2019-02-04 DIAGNOSIS — I2510 Atherosclerotic heart disease of native coronary artery without angina pectoris: Secondary | ICD-10-CM | POA: Diagnosis not present

## 2019-02-04 DIAGNOSIS — K5900 Constipation, unspecified: Secondary | ICD-10-CM | POA: Diagnosis not present

## 2019-02-04 DIAGNOSIS — D509 Iron deficiency anemia, unspecified: Secondary | ICD-10-CM | POA: Diagnosis not present

## 2019-02-04 DIAGNOSIS — G4733 Obstructive sleep apnea (adult) (pediatric): Secondary | ICD-10-CM | POA: Diagnosis not present

## 2019-02-04 DIAGNOSIS — R911 Solitary pulmonary nodule: Secondary | ICD-10-CM | POA: Diagnosis not present

## 2019-02-04 DIAGNOSIS — I119 Hypertensive heart disease without heart failure: Secondary | ICD-10-CM | POA: Diagnosis not present

## 2019-02-04 MED ORDER — QUETIAPINE FUMARATE 25 MG PO TABS: 25.0000 mg | ORAL_TABLET | Freq: Every day | ORAL | 0 refills | Status: DC

## 2019-02-04 NOTE — Telephone Encounter (Signed)
I am sending smallest dose of seroquel 25mg  take at night. Do not take if on pain meds or too sleepy This is a small starting dose, call back for concerns

## 2019-02-04 NOTE — Telephone Encounter (Signed)
Spoke to patient, shows understanding.  Nothing Further Needed at this time.

## 2019-02-04 NOTE — Telephone Encounter (Signed)
Pt calling because the abilify that was prescribed yesterday is too expensive.   Is there something else we can write for her?

## 2019-02-08 DIAGNOSIS — J9611 Chronic respiratory failure with hypoxia: Secondary | ICD-10-CM | POA: Diagnosis not present

## 2019-02-08 DIAGNOSIS — J449 Chronic obstructive pulmonary disease, unspecified: Secondary | ICD-10-CM | POA: Diagnosis not present

## 2019-02-08 DIAGNOSIS — J441 Chronic obstructive pulmonary disease with (acute) exacerbation: Secondary | ICD-10-CM | POA: Diagnosis not present

## 2019-02-13 ENCOUNTER — Other Ambulatory Visit: Payer: Self-pay

## 2019-02-13 ENCOUNTER — Encounter (HOSPITAL_COMMUNITY): Payer: Self-pay

## 2019-02-13 ENCOUNTER — Emergency Department (HOSPITAL_COMMUNITY): Payer: Medicare Other

## 2019-02-13 ENCOUNTER — Emergency Department (HOSPITAL_COMMUNITY)
Admission: EM | Admit: 2019-02-13 | Discharge: 2019-02-13 | Disposition: A | Discharge status: Home / Self Care | Payer: Medicare Other | Attending: Emergency Medicine | Admitting: Emergency Medicine

## 2019-02-13 DIAGNOSIS — I10 Essential (primary) hypertension: Secondary | ICD-10-CM | POA: Diagnosis not present

## 2019-02-13 DIAGNOSIS — J45909 Unspecified asthma, uncomplicated: Secondary | ICD-10-CM | POA: Insufficient documentation

## 2019-02-13 DIAGNOSIS — Y792 Prosthetic and other implants, materials and accessory orthopedic devices associated with adverse incidents: Secondary | ICD-10-CM | POA: Diagnosis not present

## 2019-02-13 DIAGNOSIS — E119 Type 2 diabetes mellitus without complications: Secondary | ICD-10-CM | POA: Diagnosis not present

## 2019-02-13 DIAGNOSIS — Z87891 Personal history of nicotine dependence: Secondary | ICD-10-CM | POA: Diagnosis not present

## 2019-02-13 DIAGNOSIS — Z79899 Other long term (current) drug therapy: Secondary | ICD-10-CM | POA: Insufficient documentation

## 2019-02-13 DIAGNOSIS — Z7901 Long term (current) use of anticoagulants: Secondary | ICD-10-CM | POA: Diagnosis not present

## 2019-02-13 DIAGNOSIS — Z96643 Presence of artificial hip joint, bilateral: Secondary | ICD-10-CM | POA: Insufficient documentation

## 2019-02-13 DIAGNOSIS — R52 Pain, unspecified: Secondary | ICD-10-CM | POA: Diagnosis not present

## 2019-02-13 DIAGNOSIS — T84021A Dislocation of internal left hip prosthesis, initial encounter: Secondary | ICD-10-CM | POA: Insufficient documentation

## 2019-02-13 DIAGNOSIS — J449 Chronic obstructive pulmonary disease, unspecified: Secondary | ICD-10-CM | POA: Diagnosis not present

## 2019-02-13 DIAGNOSIS — E039 Hypothyroidism, unspecified: Secondary | ICD-10-CM | POA: Diagnosis not present

## 2019-02-13 DIAGNOSIS — S73005A Unspecified dislocation of left hip, initial encounter: Secondary | ICD-10-CM

## 2019-02-13 DIAGNOSIS — R000 Tachycardia, unspecified: Secondary | ICD-10-CM | POA: Diagnosis not present

## 2019-02-13 DIAGNOSIS — M25552 Pain in left hip: Secondary | ICD-10-CM | POA: Diagnosis not present

## 2019-02-13 LAB — CBC WITH DIFFERENTIAL/PLATELET
Abs Immature Granulocytes: 0.06 10*3/uL (ref 0.00–0.07)
Basophils Absolute: 0.1 10*3/uL (ref 0.0–0.1)
Basophils Relative: 1 %
Eosinophils Absolute: 0.3 10*3/uL (ref 0.0–0.5)
Eosinophils Relative: 3 %
HCT: 38.6 % (ref 36.0–46.0)
Hemoglobin: 12.6 g/dL (ref 12.0–15.0)
Immature Granulocytes: 1 %
Lymphocytes Relative: 11 %
Lymphs Abs: 1.4 10*3/uL (ref 0.7–4.0)
MCH: 31.7 pg (ref 26.0–34.0)
MCHC: 32.6 g/dL (ref 30.0–36.0)
MCV: 97.2 fL (ref 80.0–100.0)
Monocytes Absolute: 0.9 10*3/uL (ref 0.1–1.0)
Monocytes Relative: 7 %
Neutro Abs: 9.7 10*3/uL — ABNORMAL HIGH (ref 1.7–7.7)
Neutrophils Relative %: 77 %
Platelets: 349 10*3/uL (ref 150–400)
RBC: 3.97 MIL/uL (ref 3.87–5.11)
RDW: 12.8 % (ref 11.5–15.5)
WBC: 12.5 10*3/uL — ABNORMAL HIGH (ref 4.0–10.5)
nRBC: 0 % (ref 0.0–0.2)

## 2019-02-13 LAB — BASIC METABOLIC PANEL
Anion gap: 11 (ref 5–15)
BUN: 12 mg/dL (ref 6–20)
CO2: 32 mmol/L (ref 22–32)
Calcium: 9 mg/dL (ref 8.9–10.3)
Chloride: 97 mmol/L — ABNORMAL LOW (ref 98–111)
Creatinine, Ser: 1.08 mg/dL — ABNORMAL HIGH (ref 0.44–1.00)
GFR calc Af Amer: >60 mL/min (ref >60)
GFR calc non Af Amer: 57 mL/min — ABNORMAL LOW (ref >60)
Glucose, Bld: 149 mg/dL — ABNORMAL HIGH (ref 70–99)
Potassium: 3.1 mmol/L — ABNORMAL LOW (ref 3.5–5.1)
Sodium: 140 mmol/L (ref 135–145)

## 2019-02-13 MED ORDER — SODIUM CHLORIDE 0.9 % IV SOLN
INTRAVENOUS | Status: AC | PRN
  Administered 2019-02-13: 1000 mL via INTRAVENOUS

## 2019-02-13 MED ORDER — PROPOFOL 10 MG/ML IV BOLUS
INTRAVENOUS | Status: AC
  Filled 2019-02-13: qty 20

## 2019-02-13 MED ORDER — PROPOFOL 10 MG/ML IV BOLUS: 0.5000 mg/kg | Freq: Once | INTRAVENOUS | Status: DC

## 2019-02-13 MED ORDER — HYDROMORPHONE HCL 1 MG/ML IJ SOLN
1.0000 mg | Freq: Once | INTRAMUSCULAR | Status: AC
  Administered 2019-02-13: 1 mg via INTRAVENOUS
  Filled 2019-02-13: qty 1

## 2019-02-13 MED ORDER — PROPOFOL 10 MG/ML IV BOLUS
INTRAVENOUS | Status: AC
  Filled 2019-02-13: qty 40

## 2019-02-13 MED ORDER — PROPOFOL 10 MG/ML IV BOLUS
INTRAVENOUS | Status: AC | PRN
  Administered 2019-02-13: 90 mg via INTRAVENOUS
  Administered 2019-02-13: 10 mg via INTRAVENOUS

## 2019-02-13 MED ORDER — PROPOFOL 10 MG/ML IV BOLUS
INTRAVENOUS | Status: AC | PRN
  Administered 2019-02-13 (×2): 50 mg via INTRAVENOUS

## 2019-02-13 MED ORDER — HYDROCODONE-ACETAMINOPHEN 5-325 MG PO TABS: 1.0000 | ORAL_TABLET | Freq: Four times a day (QID) | ORAL | 0 refills | Status: DC | PRN

## 2019-02-13 NOTE — ED Provider Notes (Signed)
Patient has dislocated left hip.  This is a prosthesis.  I will perform sedation and assume observation until disposition is complete.  Patient denies any prior difficulty with sedation or anesthesia.  No allergic reactions no difficulty breathing or awakening. Patient reports she has baseline emphysema.  She sometimes uses as needed oxygen at night or maybe during the day with additional exertion.  She is not oxygen dependent for all activity. Physical Exam  BP 102/63 (BP Location: Right Arm)   Pulse 77   Temp 98.1 F (36.7 C) (Oral)   Resp 18   Wt 90 kg   SpO2 94%   BMI 30.17 kg/m   Physical Exam Constitutional:      Comments: Patient is alert and answering questions appropriately.  Moderate central obesity.  HENT:     Head: Normocephalic and atraumatic.     Mouth/Throat:     Comments: Mouth is fairly dry.  Posterior airway is widely patent.  Patient is missing to inferior incisors.  Range of motion of jaw is intact and normal. Eyes:     Extraocular Movements: Extraocular movements intact.  Cardiovascular:     Rate and Rhythm: Normal rate and regular rhythm.  Pulmonary:     Comments: No respiratory distress.  Good airflow to the bases.  Rare expiratory wheeze. Abdominal:     General: There is no distension.     Palpations: Abdomen is soft.  Musculoskeletal:     Cervical back: Neck supple.     Comments: Patient has dislocated left hip.  Severe pain if any motion occurs.  Skin:    General: Skin is warm and dry.  Neurological:     General: No focal deficit present.     Mental Status: She is oriented to person, place, and time.     Coordination: Coordination normal.  Psychiatric:        Mood and Affect: Mood normal.     ED Course/Procedures     .Sedation  Date/Time: 02/13/2019 3:02 PM Performed by: Charlesetta Shanks, MD Authorized by: Charlesetta Shanks, MD   Consent:    Consent obtained:  Verbal   Consent given by:  Patient   Risks discussed:  Allergic reaction,  dysrhythmia, inadequate sedation, nausea, prolonged hypoxia resulting in organ damage, prolonged sedation necessitating reversal, respiratory compromise necessitating ventilatory assistance and intubation and vomiting   Alternatives discussed:  Analgesia without sedation, anxiolysis and regional anesthesia Universal protocol:    Procedure explained and questions answered to patient or proxy's satisfaction: yes     Relevant documents present and verified: yes     Test results available and properly labeled: yes     Imaging studies available: yes     Required blood products, implants, devices, and special equipment available: yes     Site/side marked: yes     Immediately prior to procedure a time out was called: yes     Patient identity confirmation method:  Verbally with patient Indications:    Procedure performed:  Dislocation reduction   Procedure necessitating sedation performed by:  Different physician Pre-sedation assessment:    Time since last food or drink:  12   ASA classification: class 3 - patient with severe systemic disease     Neck mobility: normal     Mouth opening:  2 finger widths   Thyromental distance:  3 finger widths   Mallampati score:  II - soft palate, uvula, fauces visible   Pre-sedation assessments completed and reviewed: airway patency, cardiovascular function, hydration status, mental  status, nausea/vomiting, pain level, respiratory function and temperature     Pre-sedation assessment completed:  02/13/2019 2:00 PM Immediate pre-procedure details:    Reassessment: Patient reassessed immediately prior to procedure     Reviewed: vital signs, relevant labs/tests and NPO status     Verified: bag valve mask available, emergency equipment available, intubation equipment available, IV patency confirmed, oxygen available and suction available   Procedure details (see MAR for exact dosages):    Preoxygenation:  Nasal cannula   Sedation:  Propofol   Intended level of  sedation: deep   Intra-procedure monitoring:  Blood pressure monitoring, cardiac monitor, continuous pulse oximetry, frequent LOC assessments, frequent vital sign checks and continuous capnometry   Intra-procedure events: none     Intra-procedure management:  Airway repositioning and BVM ventilation   Total Provider sedation time (minutes):  20 Post-procedure details:    Post-sedation assessment completed:  02/13/2019 3:04 PM   Attendance: Constant attendance by certified staff until patient recovered     Recovery: Patient returned to pre-procedure baseline     Post-sedation assessments completed and reviewed: airway patency, cardiovascular function, hydration status, mental status, nausea/vomiting, pain level, respiratory function and temperature     Patient is stable for discharge or admission: yes     Patient tolerance:  Tolerated well, no immediate complications    MDM         Charlesetta Shanks, MD 02/13/19 1504

## 2019-02-13 NOTE — Discharge Instructions (Signed)
Follow-up with your orthopedist.  Return here as needed.

## 2019-02-13 NOTE — ED Provider Notes (Addendum)
[facility] DEPT Provider Note   CSN: [id number] Arrival date & time: 02/13/19  1245     History No chief complaint on file.   Joan [name] is a 58 y.o. female.  HPI Patient presents to the emergency department with dislocation of her left hip.  Patient states she had a recent procedure done to her hip and today when she was twisting she felt her hip pop out.  Patient states that she has not had this happen since the recent procedure.  Patient denies any other injuries or problems.  Patient denies chest pain, shortness of breath, nausea, vomiting, weakness, dizziness or syncope.    Past Medical History:  Diagnosis Date  . Acute respiratory failure with hypoxia ([place])   . Alcoholism and drug addiction in family   . Anxiety   . Arthritis    "knees, hips, lower back" (09/25/2016)  . Asthma   . Bipolar disorder ([place])   . Chronic bronchitis ([place])    "all the time" (09/25/2016)  . Chronic leg pain    RLE  . Chronic lower back pain   . COPD (chronic obstructive pulmonary disease) ([place])   . Depression   . Diabetes mellitus without complication ([place])   . Emphysema of lung ([place])   . GERD (gastroesophageal reflux disease)   . Headache   . Hepatitis C    "treated back in 2001-2002"  . High cholesterol   . Hypertension   . Hypothyroidism   . Incarcerated ventral hernia 04/04/2017  . On home oxygen therapy    "2L; at nighttime" (04/04/2017)  . OSA on CPAP    CPAP with Oxygen 2 liters  . Pneumonia    "all the time" (09/25/2016)  . Positive PPD    with negative chest x ray  . Tachycardia    patient reports with exertion ; denies any other conditions   . Thyroid disease     Patient Active Problem List   Diagnosis Date Noted  . Obese 01/27/2019  . S/P left TH revision 01/26/2019  . Neck pain 12/18/2018  . Chest pain of uncertain etiology [id number]  . Hypertension   . Medication management 11/06/2018  . Acute on chronic respiratory failure with  hypoxia ([place]) 05/21/2018  . Cough 03/25/2018  . Tobacco abuse 01/15/2018  . Hypomagnesemia 12/29/2017  . COPD exacerbation ([place]) 12/28/2017  . Insomnia 12/11/2017  . Pulmonary nodule 11/11/2017  . Carotid artery disease ([place]) 11/05/2017  . Hoarseness 09/09/2017  . OSA on CPAP 08/07/2017  . Family history of heart disease 07/18/2017  . GERD without esophagitis 06/23/2017  . H/O total hip arthroplasty, left 06/18/2017  . Hypothyroidism due to acquired atrophy of thyroid 04/05/2017  . Primary osteoarthritis involving multiple joints 04/05/2017  . Incarcerated ventral hernia 04/04/2017  . Chronic constipation 02/10/2017  . Iron deficiency anemia due to dietary causes 02/10/2017  . Osteoarthritis 02/10/2017  . Dyslipidemia 02/10/2017  . H/O total hip arthroplasty, right 02/05/2017  . Rash 01/08/2017  . Pre-operative clearance 12/16/2016  . Chronic respiratory failure with hypoxia ([place]) 12/16/2016  . Essential hypertension 11/29/2016  . Back pain 11/29/2016  . Vocal cord dysfunction   . COPD, severe ([place]) 09/24/2016  . Anxiety 09/24/2016  . Steroid-induced diabetes ([place]) 09/24/2016    Past Surgical History:  Procedure Laterality Date  . BACK SURGERY    . BIOPSY  11/24/2018   Procedure: BIOPSY;  Surgeon: Thornton Park, MD;  Location: [facility];  Service: Gastroenterology;;  . CARDIAC CATHETERIZATION    .  COLONOSCOPY    . COLONOSCOPY WITH PROPOFOL N/A 11/24/2018   Procedure: COLONOSCOPY WITH PROPOFOL;  Surgeon: Thornton Park, MD;  Location: [facility];  Service: Gastroenterology;  Laterality: N/A;  . CYST EXCISION     removal of cyst in sinuses  . DILATION AND CURETTAGE OF UTERUS    . ESOPHAGOGASTRODUODENOSCOPY (EGD) WITH PROPOFOL N/A 11/24/2018   Procedure: ESOPHAGOGASTRODUODENOSCOPY (EGD) WITH PROPOFOL;  Surgeon: Thornton Park, MD;  Location: [facility];  Service: Gastroenterology;  Laterality: N/A;  . FOOT SURGERY Bilateral    bone removed from 4th and 5 th  toes and put in pin then took pin out  . HERNIA REPAIR    . INCISION AND DRAINAGE  ~ 2014/2015   "removed mesh & infection"  . INCISIONAL HERNIA REPAIR N/A 04/04/2017   Procedure: LAPAROSCOPIC INCISIONAL HERNIA REPAIR WITH MESH;  Surgeon: Fanny Skates, MD;  Location: [facility];  Service: General;  Laterality: N/A;  . LACERATION REPAIR Right    repair wrist artery from laceration from ice skate  . LAPAROSCOPIC INCISIONAL / UMBILICAL / VENTRAL HERNIA REPAIR  04/04/2017   LAPAROSCOPIC INCISIONAL HERNIA REPAIR WITH MESH  . LIPOMA EXCISION Right    fattty lipoma in neck  . NASAL SEPTUM SURGERY    . POSTERIOR LUMBAR FUSION  2015/2016 X 2   "added rods and screws"  . TOTAL HIP ARTHROPLASTY Right 02/05/2017   Procedure: RIGHT TOTAL HIP ARTHROPLASTY;  Surgeon: [name], MD;  Location: [facility];  Service: Orthopedics;  Laterality: Right;  . TOTAL HIP ARTHROPLASTY Left 06/18/2017   Procedure: LEFT TOTAL HIP ARTHROPLASTY;  Surgeon: [name], MD;  Location: [facility];  Service: Orthopedics;  Laterality: Left;  . TOTAL HIP REVISION Left 01/26/2019   Procedure: TOTAL HIP REVISION;  Surgeon: Paralee Cancel, MD;  Location: [facility];  Service: Orthopedics;  Laterality: Left;  2 hrs  . TUBAL LIGATION    . UMBILICAL HERNIA REPAIR  ~ 2013   w/mesh  . UPPER GI ENDOSCOPY       OB History   No obstetric history on file.     Family History  Problem Relation Age of Onset  . Diabetes Mother   . Hypertension Mother   . Hypertension Father   . Cancer Father        lung    Social History   Tobacco Use  . Smoking status: Former Smoker    Packs/day: 1.00    Years: 44.00    Pack years: 44.00    Types: Cigarettes    Quit date: 11/30/2018    Years since quitting: 0.2  . Smokeless tobacco: Never Used  . Tobacco comment: Smoker 1-1.5 PPD every day, 11-20-2018 "i got myself down to half a pack", i am gonna be going on chantix on the 28th"  Substance Use Topics  . Alcohol use: Not Currently  . Drug  use: Not Currently    Types: "Crack" cocaine    Comment: 04/04/2017 "nothing since 01/12/2012"    Home Medications Prior to Admission medications   Medication Sig Start Date End Date Taking? Authorizing Provider  acyclovir (ZOVIRAX) 400 MG tablet Take 1 tablet (400 mg total) by mouth 2 (two) times daily. Patient taking differently: Take 400 mg by mouth daily.  06/01/18   [name], MD  albuterol (PROVENTIL) (2.5 MG/3ML) 0.083% nebulizer solution Take 3 mLs (2.5 mg total) by nebulization 2 (two) times daily. And every 6 hours as needed.  DX: J44.9 Patient taking differently: Take 2.5 mg by nebulization  2 (two) times daily as needed. And every 6 hours as needed.  DX: J44.9 11/27/18   [name], NP  atenolol (TENORMIN) 25 MG tablet Take 1 tablet (25 mg total) by mouth daily. 06/01/18   [name], MD  atorvastatin (LIPITOR) 20 MG tablet Take 1 tablet (20 mg total) by mouth daily. 06/01/18   [name], MD  budesonide-formoterol [facility]) 160-4.5 MCG/ACT inhaler Inhale 2 puffs into the lungs 2 (two) times daily. 06/24/18   [name], NP  buPROPion (WELLBUTRIN XL) 150 MG 24 hr tablet Take 1 tablet (150 mg total) by mouth 2 (two) times daily. Patient taking differently: Take 300 mg by mouth daily.  11/13/18   [name], NP  cyclobenzaprine (FLEXERIL) 10 MG tablet Take 1 tablet (10 mg total) by mouth at bedtime. 07/10/18   [name], MD  diphenhydrAMINE (BENADRYL) 25 MG tablet Take 25 mg by mouth 2 (two) times daily.     [provider]  docusate sodium (COLACE) 100 MG capsule Take 1 capsule (100 mg total) by mouth 2 (two) times daily. 01/27/19   Danae Orleans, PA-C  ferrous sulfate (FERROUSUL) 325 (65 FE) MG tablet Take 1 tablet (325 mg total) by mouth 3 (three) times daily with meals for 14 days. 01/27/19 02/10/19  Danae Orleans, PA-C  gabapentin (NEURONTIN) 300 MG capsule TAKE 1 CAPSULE BY MOUTH  TWICE DAILY Patient taking differently:  Take 600 mg by mouth at bedtime.  10/26/18   [name], MD  hydrochlorothiazide (HYDRODIURIL) 25 MG tablet Take 1 tablet (25 mg total) by mouth daily. 06/01/18   [name], MD  HYDROcodone-acetaminophen (NORCO) 7.5-325 MG tablet Take 1-2 tablets by mouth every 4 (four) hours as needed for moderate pain. 01/27/19   Danae Orleans, PA-C  Ipratropium-Albuterol (COMBIVENT RESPIMAT) 20-100 MCG/ACT AERS respimat Inhale 1 puff into the lungs every 6 (six) hours as needed for wheezing. 04/17/18   [name], MD  levothyroxine (SYNTHROID, LEVOTHROID) 25 MCG tablet Take 1 tablet (25 mcg total) by mouth daily before breakfast. 06/01/18   [name], MD  [facility] ARTHRITIS PAIN RELIEF EX) Apply 1 patch topically daily as needed (pain).    [provider]  montelukast (SINGULAIR) 10 MG tablet Take 1 tablet (10 mg total) by mouth at bedtime. 04/17/18   [name], MD  Multiple Vitamins-Minerals (WOMENS 50+ MULTI VITAMIN/MIN PO) Take 1 tablet by mouth daily.    [provider]  nicotine (NICODERM CQ) 14 mg/24hr patch Place 1 patch (14 mg total) onto the skin daily. 12/11/18   [name], NP  nystatin-triamcinolone ointment [facility]) Apply 1 application topically 2 (two) times daily as needed (applied to skin folds/groins/under breast skin irritation rash.). 09/08/17   [name], MD  OXYGEN Inhale 2 L into the lungs at bedtime. May use 2.5 - 3 L during the day with strenuous activity Plugged it to CPAP    [provider]  pantoprazole (PROTONIX) 40 MG tablet Take 1 tablet (40 mg total) by mouth daily. Patient taking differently: Take 40 mg by mouth 2 (two) times daily.  06/01/18   [name], MD  polyethylene glycol (MIRALAX / GLYCOLAX) 17 g packet Take 17 g by mouth 2 (two) times daily. 01/27/19   Danae Orleans, PA-C  QUEtiapine (SEROQUEL) 25 MG tablet Take 1 tablet (25 mg total) by mouth at bedtime. 02/04/19   [name], MD  rivaroxaban (XARELTO) 10 MG  TABS tablet Take 1 tablet (10 mg total) by mouth daily. 01/27/19   Danae Orleans, PA-C  roflumilast (DALIRESP) 500 MCG TABS tablet Take 1 tablet (500 mcg total) by mouth daily. 11/06/18   [name], NP  traZODone (DESYREL) 100 MG tablet TAKE 1 TABLET BY MOUTH AT  BEDTIME Patient taking differently: Take 100 mg by mouth at bedtime.  01/11/19   [name], MD  varenicline (CHANTIX STARTING MONTH PAK) 0.5 MG X 11 & 1 MG X 42 tablet Take one 0.5 mg tablet by mouth once daily for 3 days, then increase to one 0.5 mg tablet twice daily for 4 days, then increase to one 1 mg tablet twice daily. Patient taking differently: Take 0.5 mg by mouth 2 (two) times daily.  11/18/18   [name], NP  venlafaxine XR (EFFEXOR-XR) 150 MG 24 hr capsule TAKE 1 CAPSULE BY MOUTH  DAILY WITH BREAKFAST Patient taking differently: Take 150 mg by mouth daily with breakfast. TAKE 1 CAPSULE BY MOUTH  DAILY WITH BREAKFAST 01/11/19   [name], MD    Allergies    Keflex [cephalexin], Prednisone, Shellfish allergy, Tetracyclines & related, Dilaudid [hydromorphone hcl], Incruse ellipta [umeclidinium bromide], and Tuberculin tests  Review of Systems   Review of Systems All other systems negative except as documented in the HPI. All pertinent positives and negatives as reviewed in the HPI. Physical Exam Updated Vital Signs BP 102/63 (BP Location: Right Arm)   Pulse 77   Temp 98.1 F (36.7 C) (Oral)   Resp 18   Wt 90 kg   SpO2 94%   BMI 30.17 kg/m   Physical Exam Vitals and nursing note reviewed.  Constitutional:      General: She is not in acute distress.    Appearance: She is well-developed.  HENT:     Head: Normocephalic and atraumatic.  Eyes:     Pupils: Pupils are equal, round, and reactive to light.  Pulmonary:     Effort: Pulmonary effort is normal.  Musculoskeletal:     Left hip: Deformity and tenderness present. Decreased range of  motion.  Skin:    General: Skin is warm and dry.  Neurological:     Mental Status: She is alert and oriented to person, place, and time.     ED Results / Procedures / Treatments   Labs (all labs ordered are listed, but only abnormal results are displayed) Labs Reviewed  BASIC METABOLIC PANEL  CBC WITH DIFFERENTIAL/PLATELET    EKG None  Radiology DG Hip Unilat With Pelvis 2-3 Views Left  Result Date: 02/13/2019 CLINICAL DATA:  Hip pain EXAM: DG HIP (WITH OR WITHOUT PELVIS) 2-3V LEFT COMPARISON:  06/18/2017 FINDINGS: Prosthetic left hip dislocation with superior displacement of the femoral component relative to the acetabular cup. Increased acetabular cup inclination. No periprosthetic loosening or fracture is identified. Visualized portion of the right total hip arthroplasty is intact without fracture. Lumbosacral fusion hardware appears grossly intact. SI joints and pubic symphysis intact without diastasis. IMPRESSION: Superior dislocation of left total hip arthroplasty. No fracture evident. Electronically Signed   By: Davina Poke M.D.   On: 02/13/2019 13:57    Procedures .Ortho Injury Treatment  Date/Time: 02/13/2019 3:21 PM Performed by: Dalia Heading, PA-C Authorized by: Dalia Heading, PA-C   Consent:    Consent obtained:  Verbal   Consent given by:  Patient   Risks discussed:  Irreducible dislocation, nerve damage and restricted joint movement   Alternatives discussed:  Immobilization,  alternative treatment and no treatmentInjury location: hip Location details: left hip Injury type: dislocation Dislocation type: anterior Spontaneous dislocation: no Prosthesis: yes Pre-procedure neurovascular assessment: neurovascularly intact Pre-procedure distal perfusion: normal Pre-procedure neurological function: normal Pre-procedure range of motion: normal  Anesthesia: Local anesthesia used: no  Patient sedated: Yes. Refer to sedation procedure  documentation for details of sedation. Manipulation performed: yes Reduction method: abduction, extension and traction and counter traction Reduction successful: yes X-ray confirmed reduction: yes Immobilization: brace Post-procedure neurovascular assessment: post-procedure neurovascularly intact Post-procedure distal perfusion: normal Post-procedure neurological function: normal Post-procedure range of motion: normal Patient tolerance: patient tolerated the procedure well with no immediate complications    (including critical care time)  Medications Ordered in ED Medications  propofol (DIPRIVAN) 10 mg/mL bolus/IV push 45 mg (has no administration in time range)  propofol (DIPRIVAN) 10 mg/mL bolus/IV push (has no administration in time range)  propofol (DIPRIVAN) 10 mg/mL bolus/IV push (has no administration in time range)  HYDROmorphone (DILAUDID) injection 1 mg (1 mg Intravenous Given 02/13/19 1328)  propofol (DIPRIVAN) 10 mg/mL bolus/IV push (50 mg Intravenous Given 02/13/19 1435)  propofol (DIPRIVAN) 10 mg/mL bolus/IV push (10 mg Intravenous Given 02/13/19 1437)  0.9 %  sodium chloride infusion (1,000 mLs Intravenous New Bag/Given 02/13/19 1434)    ED Course  I have reviewed the triage vital signs and the nursing notes.  Pertinent labs & imaging results that were available during my care of the patient were reviewed by me and considered in my medical decision making (see chart for details).    MDM Rules/Calculators/A&P                      X-ray shows dislocated left hip arthroplasty.  Will be sedated and have her hip reduced.  Patient is advised of the plan and all questions were answered.  She is consented and agrees to the procedure.  She will be sedated with propofol.  The hip seemingly went back into the joint when Dr. Vanita Panda got up on the stretcher and applied upward traction with stabilization of the pelvis. Final Clinical Impression(s) / ED Diagnoses Final  diagnoses:  None    Rx / DC Orders ED Discharge Orders    None       Dalia Heading, PA-C 02/13/19 [address], [place], PA-C 02/13/19 1522    Carmin Muskrat, MD 02/14/19 [phone]

## 2019-02-13 NOTE — Progress Notes (Signed)
RT present for conscious sedation and assisted breaths given after procedure. SATs 100%, vitals stable and pt has awaken. O2 turned off and SATs remain 100% with capnography reading 42.

## 2019-02-13 NOTE — ED Triage Notes (Signed)
Patient presented to ed with c/o left hip pain. "patient state it pop when she turn". Patient was given 200 mcg of fentanyl and 200 ml of saline per ems.

## 2019-02-17 ENCOUNTER — Other Ambulatory Visit: Payer: Self-pay

## 2019-02-17 ENCOUNTER — Ambulatory Visit (INDEPENDENT_AMBULATORY_CARE_PROVIDER_SITE_OTHER): Payer: Medicare Other | Admitting: Licensed Clinical Social Worker

## 2019-02-17 DIAGNOSIS — F4312 Post-traumatic stress disorder, chronic: Secondary | ICD-10-CM

## 2019-02-17 DIAGNOSIS — R443 Hallucinations, unspecified: Secondary | ICD-10-CM | POA: Diagnosis not present

## 2019-02-17 DIAGNOSIS — F411 Generalized anxiety disorder: Secondary | ICD-10-CM | POA: Diagnosis not present

## 2019-02-17 DIAGNOSIS — F0630 Mood disorder due to known physiological condition, unspecified: Secondary | ICD-10-CM

## 2019-02-17 NOTE — Progress Notes (Signed)
Virtual Visit via Telephone Note  I connected with Joan [name] on 02/17/19 at  1:00 PM EST by telephone and verified that I am speaking with the correct person using two identifiers.   I discussed the limitations, risks, security and privacy concerns of performing an evaluation and management service by telephone and the availability of in person appointments. I also discussed with the patient that there may be a patient responsible charge related to this service. The patient expressed understanding and agreed to proceed.   I discussed the assessment and treatment plan with the patient. The patient was provided an opportunity to ask questions and all were answered. The patient agreed with the plan and demonstrated an understanding of the instructions.   The patient was advised to call back or seek an in-person evaluation if the symptoms worsen or if the condition fails to improve as anticipated.  I provided 55 minutes of non-face-to-face time during this encounter.  THERAPIST PROGRESS NOTE  Session Time: 1:02 PM to 1:57 PM  Participation Level: Active  Behavioral Response: CasualAlertEuthymic  Type of Therapy: Individual Therapy  Treatment Goals addressed:   PTSD, coping, hallucinations  Interventions: Solution Focused, Strength-based, Supportive and Other: coping  Summary: Joan [name] is a 58 y.o. female who presents with hip surgery on December 3, but popped it, came out of socket and was so painful so laid up again. Was doing good walking and engaging in activities. Went to the emergency room and they put it back in, appointment with surgeon today. Describes mobility impacted first because has a left brace from thigh down to ankle. Also very afraid she will pop it again, so constant reminder in my brain so don't do again. Don't remember but did something wrong. Don't turn hip or leg. Most painful thing felt in her life. She went to hospital they didn't do x-ray, describes not feeling  ok wants to make sure he checks out everything is ok. Seeing her earlier because of what happened. Describes current situation that she is walking and doing things on own. Little things to take care of herself has little medical type tools to help her move around.Doing good "grabbing my button" has health button. Gave pain medication it was strong one a day, never have problem with the pills, never did anything for me except work on what I need to work on which is pain. Excited that her two feet touching the floor and now a little off when she stands. Says her thought was "Not again" Have to wait and see. Popping her hip is something not going to do again. A little bit better with family issues. "I am getting there like that when husband passed, was in a little bit of rut and then pulled myself out." Reviewed new medication Seroquel, shares her brother notices that she is not all over the place. "Sometimes does like what she did before of going over and over again in her head but better. She doesn't see dead friends anymore. Does see a bug go across. "It is not there and I know that because where it goes and stops is nowhere, plain view and then gone. Thinks could be related to eye vision in this case. "I try not to say it is real. It is gone so can't be real". Staying at home for holidays. At brothers and two sisters coming over and spending the holiday here. "Let that go with family." Reviewed her belief that "I thought I was over my  grieving" After her husband passed couldn't function, couldn't sit still, couldn't stay in apartment had to let it go because I couldn't sit still. A lot going on in apartment as far as dead friends. Left apartment and gave it up and wish didn't but did. Here in [place] and don't always like the situation she is in. Describes "Dammed if you do or don't at times. Mind my business stay in room and watch shows, do what I want to do." "He gets tired of taking care of me" so patient  has made more effort to push myself. Not asking for help and just do it. I" actually like it and don't have it thrown in my face. It can be challenging but get it and figure it out." Dealing with pain can make it difficult. Reviewed with husband that it has been  5-6 years into his passing called his twin brother who says not very nice words. They blame me for his death but patient was in jail. "I guess they need to blame somebody". Always got along with his family but then it was like she was dirt. That is the way it goes when somebody in their family breaks up with somebody. He had a heart attack. When blamed me at first bothered me, but now could care less.  His brother and his wife were telling her things that caused her to be very angry. Then from that point able to move on, do things and not rip myself apart everyday. Wasn't there when passed, should have been a better wife. I am doing better and getting there. Patient doesn't think symptoms have to do with grief, it has been 8 years since he died and moving along the path for 2-3 years. We had a beautiful relationship and I was a crack addict and then I quit. Got together got married. He wanted them to experiment with coke. Patient was wary of it they did and door open and off I went and the worst I have ever been. When went home he wouldn't let me committed again after he promised he would never do it again. Wound up in prison. A week after he passed on 10/15/10 passed. Thinks he smoked what she left when committed and not used to that amount and addicted to percocet hit his heart too hard. Can't tell his family that. Reviewed session and patient relates it is helpful when talk to therapist and enjoys it.  Suicidal/Homicidal: No  Therapist Response: Reviewed symptoms, discussed stressors, facilitated expression of thoughts and feelings.  Discussed when the main stressors is pain and complications from surgery and pointed out patient taking steps  for problem solving helping with situation by seeing Dr. And following her guidance.  Pointed out patient taking an active role in doing things to take care of herself as very helpful strategies both for her own wellbeing as well as for relationships.  Related it will have a positive impact on mental health a feeling of self efficacy once recognition of the ability to accomplish that and positive feelings from different tasks she is able to complete will help with self-esteem and positive mood.  Processed patient's feelings related to family issue and assessed this as patient selecting helpful coping strategies for herself as she moves forward to work on herself in this case letting go of past disputes.  Processed feelings related to grief and reviewing reasons why patient feels she has worked through many of the issues that make  her think unrelated to current symptoms.  Provided strength based and supportive intervention Plan: Return again in 2 weeks.2.Therapist work with patient on mood regulation, stress management, management of symptoms, explore and help work on grief and PTSD symptoms as needed  Diagnosis: Axis I:  PTSD, hallucinations, GAD, Mood Disorder in conditions classified elsewhere    Axis II: No diagnosis    [name], LCSW 02/17/2019

## 2019-02-22 ENCOUNTER — Telehealth: Payer: Self-pay | Admitting: Pharmacist

## 2019-02-22 DIAGNOSIS — J9611 Chronic respiratory failure with hypoxia: Secondary | ICD-10-CM | POA: Diagnosis not present

## 2019-02-22 DIAGNOSIS — G4733 Obstructive sleep apnea (adult) (pediatric): Secondary | ICD-10-CM | POA: Diagnosis not present

## 2019-02-22 DIAGNOSIS — J441 Chronic obstructive pulmonary disease with (acute) exacerbation: Secondary | ICD-10-CM | POA: Diagnosis not present

## 2019-02-22 DIAGNOSIS — J449 Chronic obstructive pulmonary disease, unspecified: Secondary | ICD-10-CM | POA: Diagnosis not present

## 2019-02-22 NOTE — Telephone Encounter (Signed)
Called pt on 01/25/2019 at 2:23 PM. Patient states she has been successful with smoking cessation since last telephone call. Congratulated patient on her success!!! She has completed Chantix. She was supposed to taper nicotine patch dose from 21 mg daily to 14 mg daily. However, she does not use nicotine 14 mg patch consistently because she feels she does not need it.  Age when started using tobacco on a daily basis: 58 years old (would smoke 2-3 packs per day) Preferred cigarette brand: Marlboro lights then switched to Parliament Tobacco use: none Triggers:stress, specifically if she hears bad news, is in a fight with a family member, or if she is bored.  Current smoking cessation agents:nicotine patch 14 mg daily (usse occasionally) Prior smoking cessation agents:gum or lozenge(caused her throat to burn and were painful), nicotine 21 mg daily (successful agent for smoking cessation for patient), Chantix (successful agent for smoking cessation for patient) Non-pharmacologic methods:organizing room,cuddle with her brother's cat Elyse Hsu) or dog Berle Mull) when she gets an urge to smoke,praying, eatinga lollipop, calls [place]  Plan:  1. Continue living smoke-free lifestyle. If patient is comfortable not using nicotine 14 mg daily informed patient she is safe to do so. If she gets an urge to start smoking again informed patient to reach out to clinic to discuss smoking cessation therapy with pharmacy team.  Follow up: 05/24/2019

## 2019-02-24 ENCOUNTER — Ambulatory Visit (INDEPENDENT_AMBULATORY_CARE_PROVIDER_SITE_OTHER): Payer: Medicare Other | Admitting: Psychiatry

## 2019-02-24 ENCOUNTER — Encounter (HOSPITAL_COMMUNITY): Payer: Self-pay | Admitting: Psychiatry

## 2019-02-24 DIAGNOSIS — R443 Hallucinations, unspecified: Secondary | ICD-10-CM

## 2019-02-24 DIAGNOSIS — F0630 Mood disorder due to known physiological condition, unspecified: Secondary | ICD-10-CM

## 2019-02-24 DIAGNOSIS — F411 Generalized anxiety disorder: Secondary | ICD-10-CM

## 2019-02-24 DIAGNOSIS — F4312 Post-traumatic stress disorder, chronic: Secondary | ICD-10-CM | POA: Diagnosis not present

## 2019-02-24 MED ORDER — QUETIAPINE FUMARATE 50 MG PO TABS: 50.0000 mg | ORAL_TABLET | Freq: Every day | ORAL | 1 refills | Status: DC

## 2019-02-24 NOTE — Progress Notes (Signed)
[place] Follow up visit  Patient Identification: Joan [name] MRN:  [id number] Date of Evaluation:  02/24/2019 Referral Source:Therapist [name] Chief Complaint:   depression follow up  Visit Diagnosis:    ICD-10-CM   1. Mood disorder in conditions classified elsewhere  F06.30   2. GAD (generalized anxiety disorder)  F41.1   3. Post-traumatic stress disorder, chronic  F43.12   4. Hallucinations  R44.3    I connected with Joan [name] on 02/24/19 at  1:30 PM EST by a audio  enabled telemedicine application and verified that I am speaking with the correct person using two identifiers.    I discussed the limitations of evaluation and management by telemedicine and the availability of in person appointments. The patient expressed understanding and agreed to proceed.  History of Present Illness: Patient is a 58 years old currently widow Caucasian female referred by her therapist for management of mood symptoms, depression possible PTSD and anxiety  She has had another  hip replacement surgery currently . Past  history of drug use including cocaine marijuana and alcohol use that she stopped nearly 8 years ago.  She has been incarcerated and involuntarily committed by her husband at that time to get sober.    She was having hallucinations of seeing dead people after her husband death and felt he was alive  Has sufferred from grief and trauma  Has been taking wellbutrin, trazadone,effexor Last visit abilify was added but was expensive so changed verbal order to seroquel at night It has helped , decrease hallucinations and on agitation but now noticing recurrence of some hallucinations  Primary care has also started flexeril at night Worries fluctuate  Aggravating factor; difficult childhood, Grief, difficult relationship with middle kid, hip replacement Modifying factor: sister and brother, but still has difficult time with them   Duration adult life Severity of depression: denies  hopelessness Some recurrence of  paranoia and hallucinations . Denies if she was having when young but more so during last 8 years after her second husband death.    Past Psychiatric History: depression, drug use  Previous Psychotropic Medications: Yes   Substance Abuse History in the last 12 months:  No.  Consequences of Substance Abuse: history of extensive drug use uptil 8 years ago and have been in rehab programs  Past Medical History:  Past Medical History:  Diagnosis Date  . Acute respiratory failure with hypoxia ([place])   . Alcoholism and drug addiction in family   . Anxiety   . Arthritis    "knees, hips, lower back" (09/25/2016)  . Asthma   . Bipolar disorder ([place])   . Chronic bronchitis ([place])    "all the time" (09/25/2016)  . Chronic leg pain    RLE  . Chronic lower back pain   . COPD (chronic obstructive pulmonary disease) ([place])   . Depression   . Diabetes mellitus without complication ([place])   . Emphysema of lung ([place])   . GERD (gastroesophageal reflux disease)   . Headache   . Hepatitis C    "treated back in 2001-2002"  . High cholesterol   . Hypertension   . Hypothyroidism   . Incarcerated ventral hernia 04/04/2017  . On home oxygen therapy    "2L; at nighttime" (04/04/2017)  . OSA on CPAP    CPAP with Oxygen 2 liters  . Pneumonia    "all the time" (09/25/2016)  . Positive PPD    with negative chest x ray  . Tachycardia  patient reports with exertion ; denies any other conditions   . Thyroid disease     Past Surgical History:  Procedure Laterality Date  . BACK SURGERY    . BIOPSY  11/24/2018   Procedure: BIOPSY;  Surgeon: Thornton Park, MD;  Location: [facility];  Service: Gastroenterology;;  . CARDIAC CATHETERIZATION    . COLONOSCOPY    . COLONOSCOPY WITH PROPOFOL N/A 11/24/2018   Procedure: COLONOSCOPY WITH PROPOFOL;  Surgeon: Thornton Park, MD;  Location: [facility];  Service: Gastroenterology;  Laterality: N/A;  . CYST EXCISION      removal of cyst in sinuses  . DILATION AND CURETTAGE OF UTERUS    . ESOPHAGOGASTRODUODENOSCOPY (EGD) WITH PROPOFOL N/A 11/24/2018   Procedure: ESOPHAGOGASTRODUODENOSCOPY (EGD) WITH PROPOFOL;  Surgeon: Thornton Park, MD;  Location: [facility];  Service: Gastroenterology;  Laterality: N/A;  . FOOT SURGERY Bilateral    bone removed from 4th and 5 th toes and put in pin then took pin out  . HERNIA REPAIR    . INCISION AND DRAINAGE  ~ 2014/2015   "removed mesh & infection"  . INCISIONAL HERNIA REPAIR N/A 04/04/2017   Procedure: LAPAROSCOPIC INCISIONAL HERNIA REPAIR WITH MESH;  Surgeon: Fanny Skates, MD;  Location: [facility];  Service: General;  Laterality: N/A;  . LACERATION REPAIR Right    repair wrist artery from laceration from ice skate  . LAPAROSCOPIC INCISIONAL / UMBILICAL / VENTRAL HERNIA REPAIR  04/04/2017   LAPAROSCOPIC INCISIONAL HERNIA REPAIR WITH MESH  . LIPOMA EXCISION Right    fattty lipoma in neck  . NASAL SEPTUM SURGERY    . POSTERIOR LUMBAR FUSION  2015/2016 X 2   "added rods and screws"  . TOTAL HIP ARTHROPLASTY Right 02/05/2017   Procedure: RIGHT TOTAL HIP ARTHROPLASTY;  Surgeon: [name], MD;  Location: [facility];  Service: Orthopedics;  Laterality: Right;  . TOTAL HIP ARTHROPLASTY Left 06/18/2017   Procedure: LEFT TOTAL HIP ARTHROPLASTY;  Surgeon: [name], MD;  Location: [facility];  Service: Orthopedics;  Laterality: Left;  . TOTAL HIP REVISION Left 01/26/2019   Procedure: TOTAL HIP REVISION;  Surgeon: Paralee Cancel, MD;  Location: [facility];  Service: Orthopedics;  Laterality: Left;  2 hrs  . TUBAL LIGATION    . UMBILICAL HERNIA REPAIR  ~ 2013   w/mesh  . UPPER GI ENDOSCOPY      Family Psychiatric History: FAther : alcohol use  Family History:  Family History  Problem Relation Age of Onset  . Diabetes Mother   . Hypertension Mother   . Hypertension Father   . Cancer Father        lung    Social History:   Social History   Socioeconomic History  .  Marital status: Widowed    Spouse name: Not on file  . Number of children: 3  . Years of education: Not on file  . Highest education level: Not on file  Occupational History  . Not on file  Tobacco Use  . Smoking status: Former Smoker    Packs/day: 1.00    Years: 44.00    Pack years: 44.00    Types: Cigarettes    Quit date: 11/30/2018    Years since quitting: 0.2  . Smokeless tobacco: Never Used  . Tobacco comment: Smoker 1-1.5 PPD every day, 11-20-2018 "i got myself down to half a pack", i am gonna be going on chantix on the 28th"  Substance and Sexual Activity  . Alcohol use: Not Currently  . Drug use:  Not Currently    Types: "Crack" cocaine    Comment: 04/04/2017 "nothing since 01/12/2012"  . Sexual activity: Never  Other Topics Concern  . Not on file  Social History Narrative  . Not on file   Social Determinants of Health   Financial Resource Strain:   . Difficulty of Paying Living Expenses: Not on file  Food Insecurity:   . Worried About Charity fundraiser in the Last Year: Not on file  . Ran Out of Food in the Last Year: Not on file  Transportation Needs:   . Lack of Transportation (Medical): Not on file  . Lack of Transportation (Non-Medical): Not on file  Physical Activity: Inactive  . Days of Exercise per Week: 0 days  . Minutes of Exercise per Session: 0 min  Stress: Stress Concern Present  . Feeling of Stress : To some extent  Social Connections:   . Frequency of Communication with Friends and Family: Not on file  . Frequency of Social Gatherings with Friends and Family: Not on file  . Attends Religious Services: Not on file  . Active Member of Clubs or Organizations: Not on file  . Attends Archivist Meetings: Not on file  . Marital Status: Not on file      Allergies:   Allergies  Allergen Reactions  . Keflex [Cephalexin] Other (See Comments)    unspecified  . Prednisone Other (See Comments)    "counter reacts"  . Shellfish Allergy Other  (See Comments)    Stomach cramps  . Tetracyclines & Related Other (See Comments)    unspecified  . Dilaudid [Hydromorphone Hcl] Other (See Comments)    "Lethargy"  . Incruse Ellipta [Umeclidinium Bromide] Nausea Only  . Tuberculin Tests Other (See Comments)    False postive    Metabolic Disorder Labs: Lab Results  Component Value Date   HGBA1C 6.7 (H) 12/17/2018   MPG 154.2 05/22/2018   MPG 128.37 06/11/2017   No results found for: PROLACTIN Lab Results  Component Value Date   CHOL 125 12/17/2018   TRIG 261.0 (H) 12/17/2018   HDL 37.80 (L) 12/17/2018   CHOLHDL 3 12/17/2018   VLDL 52.2 (H) 12/17/2018   LDLCALC 62 08/19/2017   Lab Results  Component Value Date   TSH 0.251 (L) 12/29/2017    Therapeutic Level Labs: No results found for: LITHIUM No results found for: CBMZ No results found for: VALPROATE  Current Medications: Current Outpatient Medications  Medication Sig Dispense Refill  . acyclovir (ZOVIRAX) 400 MG tablet Take 1 tablet (400 mg total) by mouth 2 (two) times daily. (Patient taking differently: Take 400 mg by mouth daily. ) 180 tablet 3  . albuterol (PROVENTIL) (2.5 MG/3ML) 0.083% nebulizer solution Take 3 mLs (2.5 mg total) by nebulization 2 (two) times daily. And every 6 hours as needed.  DX: J44.9 (Patient taking differently: Take 2.5 mg by nebulization 2 (two) times daily as needed. And every 6 hours as needed.  DX: J44.9) 360 mL 3  . atenolol (TENORMIN) 25 MG tablet Take 1 tablet (25 mg total) by mouth daily. 90 tablet 3  . atorvastatin (LIPITOR) 20 MG tablet Take 1 tablet (20 mg total) by mouth daily. 90 tablet 3  . budesonide-formoterol (SYMBICORT) 160-4.5 MCG/ACT inhaler Inhale 2 puffs into the lungs 2 (two) times daily. 2 Inhaler 0  . buPROPion (WELLBUTRIN XL) 150 MG 24 hr tablet Take 1 tablet (150 mg total) by mouth 2 (two) times daily. (Patient taking differently: Take 150  mg by mouth daily. ) 180 tablet 3  . cyclobenzaprine (FLEXERIL) 10 MG tablet  Take 1 tablet (10 mg total) by mouth at bedtime. 90 tablet 1  . diphenhydrAMINE (BENADRYL) 25 MG tablet Take 25 mg by mouth 2 (two) times daily.     Marland Kitchen docusate sodium (COLACE) 100 MG capsule Take 1 capsule (100 mg total) by mouth 2 (two) times daily. 28 capsule 0  . ferrous sulfate (FERROUSUL) 325 (65 FE) MG tablet Take 1 tablet (325 mg total) by mouth 3 (three) times daily with meals for 14 days. 42 tablet 0  . gabapentin (NEURONTIN) 300 MG capsule TAKE 1 CAPSULE BY MOUTH  TWICE DAILY (Patient taking differently: Take 600 mg by mouth at bedtime. ) 180 capsule 1  . hydrochlorothiazide (HYDRODIURIL) 25 MG tablet Take 1 tablet (25 mg total) by mouth daily. 90 tablet 3  . HYDROcodone-acetaminophen (NORCO) 7.5-325 MG tablet Take 1-2 tablets by mouth every 4 (four) hours as needed for moderate pain. (Patient not taking: Reported on 02/13/2019) 60 tablet 0  . HYDROcodone-acetaminophen (NORCO/VICODIN) 5-325 MG tablet Take 1 tablet by mouth every 6 (six) hours as needed for moderate pain. 15 tablet 0  . Ipratropium-Albuterol (COMBIVENT RESPIMAT) 20-100 MCG/ACT AERS respimat Inhale 1 puff into the lungs every 6 (six) hours as needed for wheezing. (Patient taking differently: Inhale 2 puffs into the lungs every 6 (six) hours as needed for wheezing. ) 3 Inhaler 3  . levothyroxine (SYNTHROID, LEVOTHROID) 25 MCG tablet Take 1 tablet (25 mcg total) by mouth daily before breakfast. 90 tablet 3  . Liniments (SALONPAS ARTHRITIS PAIN RELIEF EX) Apply 1 patch topically daily as needed (pain).    . Melatonin 10 MG TABS Take 10 mg by mouth at bedtime.    . montelukast (SINGULAIR) 10 MG tablet Take 1 tablet (10 mg total) by mouth at bedtime. 90 tablet 3  . Multiple Vitamins-Minerals (WOMENS 50+ MULTI VITAMIN/MIN PO) Take 1 tablet by mouth daily.    . nicotine (NICODERM CQ) 14 mg/24hr patch Place 1 patch (14 mg total) onto the skin daily. 28 patch 0  . nystatin-triamcinolone ointment (MYCOLOG) Apply 1 application topically  2 (two) times daily as needed (applied to skin folds/groins/under breast skin irritation rash.). 100 g 3  . OXYGEN Inhale 2 L into the lungs at bedtime. May use 2.5 - 3 L during the day with strenuous activity Plugged it to CPAP    . pantoprazole (PROTONIX) 40 MG tablet Take 1 tablet (40 mg total) by mouth daily. (Patient taking differently: Take 40 mg by mouth 2 (two) times daily. ) 90 tablet 3  . polyethylene glycol (MIRALAX / GLYCOLAX) 17 g packet Take 17 g by mouth 2 (two) times daily. 28 packet 0  . QUEtiapine (SEROQUEL) 50 MG tablet Take 1 tablet (50 mg total) by mouth at bedtime. 30 tablet 1  . rivaroxaban (XARELTO) 10 MG TABS tablet Take 1 tablet (10 mg total) by mouth daily. (Patient not taking: Reported on 02/13/2019) 21 tablet 0  . roflumilast (DALIRESP) 500 MCG TABS tablet Take 1 tablet (500 mcg total) by mouth daily. 90 tablet 3  . traZODone (DESYREL) 100 MG tablet TAKE 1 TABLET BY MOUTH AT  BEDTIME (Patient taking differently: Take 100 mg by mouth at bedtime. ) 90 tablet 3  . varenicline (CHANTIX STARTING MONTH PAK) 0.5 MG X 11 & 1 MG X 42 tablet Take one 0.5 mg tablet by mouth once daily for 3 days, then increase to one 0.5 mg  tablet twice daily for 4 days, then increase to one 1 mg tablet twice daily. (Patient taking differently: Take 0.5 mg by mouth 2 (two) times daily. ) 53 tablet 0  . venlafaxine XR (EFFEXOR-XR) 150 MG 24 hr capsule TAKE 1 CAPSULE BY MOUTH  DAILY WITH BREAKFAST (Patient taking differently: Take 150 mg by mouth daily with breakfast. TAKE 1 CAPSULE BY MOUTH  DAILY WITH BREAKFAST) 90 capsule 3   No current facility-administered medications for this visit.     Psychiatric Specialty Exam: Review of Systems  Cardiovascular: Negative for chest pain.  Musculoskeletal: Positive for joint pain.  Psychiatric/Behavioral: Negative for substance abuse and suicidal ideas.    There were no vitals taken for this visit.There is no height or weight on file to calculate BMI.   General Appearance:  Eye Contact:    Speech:  Slow  Volume:  Decreased  Mood:  somewhat subdued  Affect:  Congruent  Thought Process:  Goal Directed  Orientation:  Full (Time, Place, and Person)  Thought Content:  Some hallucinations, visual and non commanding auditory  Suicidal Thoughts:  No  Homicidal Thoughts:  No  Memory:  Immediate;   Fair Recent;   Fair  Judgement:  Fair  Insight:  Shallow  Psychomotor Activity:  Decreased  Concentration:  Concentration: Fair and Attention Span: Fair  Recall:  [employer] of Knowledge:Fair  Language: Fair  Akathisia:  No  Handed:    AIMS (if indicated): no involuntary movements  Assets:  Desire for Improvement Leisure Time Social Support  ADL's: limited due to recent surgery  Cognition: WNL  Sleep:  variable   Screenings: Mini-Mental     Clinical Support from 04/06/2018 in [facility]  Total Score (max 30 points )  29    PHQ2-9     Patient Outreach Telephone from 12/18/2018 in [facility] Patient Outreach Telephone from 11/18/2018 in [facility] from 04/06/2018 in [facility] Visit from 11/28/2016 in [facility]  PHQ-2 Total Score  3  1  2  1   PHQ-9 Total Score  --  --  6  --      Assessment and Plan: as follows  Mood disorder unspecified: relavant to multiple medical concerns and past history of using drugs, alcohol Recurrent depressive episodes: not worse. Some subdued, continue wellbutrin and effexor, has refills from primary care   Hallucinations may be part of Grief, PTSD or depression or possible part of schizoaffective disorder. Hallucinations got better with seroquel, now some recurrence, will increase to 50mg . Cautioned about sedation and to lower trazadone if can.    Risk side effects discussed Has support of her brother and case/care giver since discharge  Would recommend more frequent  therapy for depression, grief     I discussed the assessment and treatment plan with the patient. The patient was provided an opportunity to ask questions and all were answered. The patient agreed with the plan and demonstrated an understanding of the instructions.   The patient was advised to call back or seek an in-person evaluation if the symptoms worsen or if the condition fails to improve as anticipated. Fu 4-6w or earlier if needed.   [name], MD 12/30/20201:49 PM

## 2019-03-01 ENCOUNTER — Ambulatory Visit: Payer: Medicare Other | Admitting: Obstetrics & Gynecology

## 2019-03-04 ENCOUNTER — Ambulatory Visit (INDEPENDENT_AMBULATORY_CARE_PROVIDER_SITE_OTHER): Payer: Medicare Other | Admitting: Licensed Clinical Social Worker

## 2019-03-04 ENCOUNTER — Other Ambulatory Visit: Payer: Self-pay

## 2019-03-04 DIAGNOSIS — F4312 Post-traumatic stress disorder, chronic: Secondary | ICD-10-CM

## 2019-03-04 DIAGNOSIS — F411 Generalized anxiety disorder: Secondary | ICD-10-CM | POA: Diagnosis not present

## 2019-03-04 DIAGNOSIS — F0630 Mood disorder due to known physiological condition, unspecified: Secondary | ICD-10-CM
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
## 2019-03-11 ENCOUNTER — Other Ambulatory Visit: Payer: Self-pay | Admitting: Internal Medicine

## 2019-03-11 DIAGNOSIS — G4733 Obstructive sleep apnea (adult) (pediatric): Secondary | ICD-10-CM | POA: Diagnosis not present

## 2019-03-11 DIAGNOSIS — J449 Chronic obstructive pulmonary disease, unspecified: Secondary | ICD-10-CM | POA: Diagnosis not present

## 2019-03-11 DIAGNOSIS — J9611 Chronic respiratory failure with hypoxia: Secondary | ICD-10-CM | POA: Diagnosis not present

## 2019-03-11 DIAGNOSIS — J441 Chronic obstructive pulmonary disease with (acute) exacerbation: Secondary | ICD-10-CM | POA: Diagnosis not present

## 2019-03-13 ENCOUNTER — Other Ambulatory Visit (HOSPITAL_COMMUNITY)
Admission: RE | Admit: 2019-03-13 | Discharge: 2019-03-13 | Disposition: A | Discharge status: Home / Self Care | Payer: Medicare Other | Source: Ambulatory Visit | Attending: Acute Care | Admitting: Acute Care

## 2019-03-13 DIAGNOSIS — Z01812 Encounter for preprocedural laboratory examination: Secondary | ICD-10-CM | POA: Diagnosis not present

## 2019-03-13 DIAGNOSIS — Z20822 Contact with and (suspected) exposure to covid-19: Secondary | ICD-10-CM | POA: Insufficient documentation

## 2019-03-14 LAB — NOVEL CORONAVIRUS, NAA (HOSP ORDER, SEND-OUT TO REF LAB; TAT 18-24 HRS): SARS-CoV-2, NAA: NOT DETECTED

## 2019-03-15 DIAGNOSIS — M25562 Pain in left knee: Secondary | ICD-10-CM | POA: Diagnosis not present

## 2019-03-15 DIAGNOSIS — Z471 Aftercare following joint replacement surgery: Secondary | ICD-10-CM | POA: Diagnosis not present

## 2019-03-15 DIAGNOSIS — Z96642 Presence of left artificial hip joint: Secondary | ICD-10-CM | POA: Diagnosis not present

## 2019-03-17 ENCOUNTER — Other Ambulatory Visit: Payer: Self-pay

## 2019-03-17 ENCOUNTER — Other Ambulatory Visit: Payer: Self-pay | Admitting: *Deleted

## 2019-03-17 ENCOUNTER — Ambulatory Visit (INDEPENDENT_AMBULATORY_CARE_PROVIDER_SITE_OTHER): Payer: Medicare Other | Admitting: Pulmonary Disease

## 2019-03-17 ENCOUNTER — Ambulatory Visit (INDEPENDENT_AMBULATORY_CARE_PROVIDER_SITE_OTHER): Payer: Medicare Other | Admitting: Acute Care

## 2019-03-17 ENCOUNTER — Encounter: Payer: Self-pay | Admitting: Acute Care

## 2019-03-17 VITALS — BP 118/62 | HR 74 | Ht 66.0 in | Wt 200.0 lb

## 2019-03-17 DIAGNOSIS — G4733 Obstructive sleep apnea (adult) (pediatric): Secondary | ICD-10-CM

## 2019-03-17 DIAGNOSIS — R5381 Other malaise: Secondary | ICD-10-CM | POA: Diagnosis not present

## 2019-03-17 DIAGNOSIS — Z87891 Personal history of nicotine dependence: Secondary | ICD-10-CM | POA: Diagnosis not present

## 2019-03-17 DIAGNOSIS — J449 Chronic obstructive pulmonary disease, unspecified: Secondary | ICD-10-CM | POA: Diagnosis not present

## 2019-03-17 DIAGNOSIS — Z9989 Dependence on other enabling machines and devices: Secondary | ICD-10-CM | POA: Diagnosis not present

## 2019-03-17 LAB — PULMONARY FUNCTION TEST
DL/VA % pred: 54 %
DL/VA: 2.28 ml/min/mmHg/L
DLCO unc % pred: 44 %
DLCO unc: 9.8 ml/min/mmHg
FEF 25-75 Post: 0.63 L/sec
FEF 25-75 Pre: 0.44 L/sec
FEF2575-%Change-Post: 43 %
FEF2575-%Pred-Post: 24 %
FEF2575-%Pred-Pre: 16 %
FEV1-%Change-Post: 16 %
FEV1-%Pred-Post: 43 %
FEV1-%Pred-Pre: 37 %
FEV1-Post: 1.24 L
FEV1-Pre: 1.06 L
FEV1FVC-%Change-Post: 3 %
FEV1FVC-%Pred-Pre: 57 %
FEV6-%Change-Post: 13 %
FEV6-%Pred-Post: 72 %
FEV6-%Pred-Pre: 64 %
FEV6-Post: 2.55 L
FEV6-Pre: 2.25 L
FEV6FVC-%Change-Post: 0 %
FEV6FVC-%Pred-Post: 99 %
FEV6FVC-%Pred-Pre: 99 %
FVC-%Change-Post: 13 %
FVC-%Pred-Post: 73 %
FVC-%Pred-Pre: 64 %
FVC-Post: 2.66 L
FVC-Pre: 2.35 L
Post FEV1/FVC ratio: 47 %
Post FEV6/FVC ratio: 96 %
Pre FEV1/FVC ratio: 45 %
Pre FEV6/FVC Ratio: 96 %
RV % pred: 146 %
RV: 2.98 L
TLC % pred: 106 %
TLC: 5.72 L

## 2019-03-17 NOTE — Patient Instructions (Addendum)
It is good to see you today Congratulations on quitting smoking Your PFT's show some worsening obstruction, but your diffusion capacity is better, which is good.  Please remain smoke free!! Continue Symbicort160, Daliresp, and Combivent Nebulizer >> Albuterolas needed for wheezing or shortness of breath. We will place a pharmacy referral to see if meds will continue to be covered. We will send in a referral for pulmonary rehab Please get your COVID vaccine as soon as you are eligible. Make sure you walk daily Lung Cancer Screening due 11/2019 Follow up in 3 months with Judson Roch NP or Dr. Elsworth Soho    Your CPAP down load  looks great. Continue on CPAP at bedtime. You appear to be benefiting from the treatment  Goal is to wear for at least 6 hours each night for maximal clinical benefit. Continue to work on weight loss, as the link between excess weight  and sleep apnea is well established.   Remember to establish a good bedtime routine, and work on sleep hygiene.  Limit daytime naps , avoid stimulants such as caffeine and nicotine close to bedtime, exercise daily to promote sleep quality, avoid heavy , spicy, fried , or rich foods before bed. Ensure adequate exposure to natural light during the day,establish a relaxing bedtime routine with a pleasant sleep environment ( Bedroom between 60 and 67 degrees, turn off bright lights , TV or device screens screens , consider black out curtains or white noise machines) Do not drive if sleepy. Remember to clean mask, tubing, filter, and reservoir once weekly with soapy water.  We will do LFT's today We will call you with results Follow up with Dr. Elsworth Soho or Judson Roch NP  In 3  or before as needed.   Please contact office for sooner follow up if symptoms do not improve or worsen or seek emergency care

## 2019-03-17 NOTE — Assessment & Plan Note (Signed)
Great compliance AHI 2.7 on Down Load Plan Your CPAP down load  looks great. Continue on CPAP at bedtime. You appear to be benefiting from the treatment  Goal is to wear for at least 6 hours each night for maximal clinical benefit. Continue to work on weight loss, as the link between excess weight  and sleep apnea is well established.   Remember to establish a good bedtime routine, and work on sleep hygiene.  Limit daytime naps , avoid stimulants such as caffeine and nicotine close to bedtime, exercise daily to promote sleep quality, avoid heavy , spicy, fried , or rich foods before bed. Ensure adequate exposure to natural light during the day,establish a relaxing bedtime routine with a pleasant sleep environment ( Bedroom between 60 and 67 degrees, turn off bright lights , TV or device screens screens , consider black out curtains or white noise machines) Do not drive if sleepy. Remember to clean mask, tubing, filter, and reservoir once weekly with soapy water.  We will do LFT's today We will call you with results Follow up with Dr. Elsworth Soho or Judson Roch NP  In 3  or before as needed.   Please contact office for sooner follow up if symptoms do not improve or worsen or seek emergency care

## 2019-03-17 NOTE — Progress Notes (Signed)
History of Present Illness Joan [name] is a 59 y.o. female recently former smoker   with COPD. Smoking history: 1 pack/day. 44-pack-year smoking history.( Quit 11/2018) Maintenance:Symbicort160, Daliresp, and Combivent Nebulizer >> Albuterol Patient of Dr. Elsworth Soho Followed by Lung Cancer Screening Program Last antibiotic 11/06/2018    03/17/2019 Pt. Presents for follow up.She has quit smoking entirely.She was successful with Chantix.  I have told her I am very proud of her.  She had hip surgery 01/26/2019. She had dislocation of the hip post op  02/13/2019 and she went to the ED for reduction of the hip. She was in a brace for about 3 weeks. She has stopped wearing the brace as she finds it very difficult to wear. She is being cautious with the leg. She is not using physical Therapy , she opted not to do so. She is in a wheel chair. We did discuss that her continued shortness of breath with exertion could be due to her physical deconditioning.We discussed that she needs to remain as active as possible She is compliant with her pulmonary regiment.She denies any discolored secretions She is using her albuterol very infrequently as she wheezes very infrequently. She does have some swelling in her feet.  She is tolerating Daliresp well.  We will check LFT's today She denies fever, chest pain, orthopnea or hemoptysis  Down Load 02/15/2019-03/16/2019 Usage 29/30 days > 4 hours 24 days < 4 hours 5 days AirSense AutoSet 10 cm H2O with O2 bled into system AHI 2.7    Test Results: 11/06/2018 - CBC Diff - Eos 5.5, eos absolute 0.6  11/09/2018 CXR FINDINGS: The chest is hyperexpanded with attenuation of the pulmonary vasculature consistent with emphysema. Lungs are clear. No pneumothorax or pleural fluid. Heart size is normal. Emphysema without acute disease.  NPSG 02/2017 >> no OSA HST 04/2017 AHI of 15/h>> Moderate sleep apnea   11/2017 CT Chest Stable 4 mm right upper lobe  nodule and 2 mm left upper lobe nodule. A previously demonstrated 3 mm left upper lobe nodule is no longer seen. No follow-up needed if patient is low-risk (and has no known or suspected primary neoplasm). Non-contrast chest CT can be considered in 12 months if patient is high-risk. This recommendation follows the consensus statement: Guidelines for Management of Incidental Pulmonary Nodules Detected on CT Images: From the Fleischner Society 2017; Radiology 2017; 284:228-243. Changes of COPD with centrilobular emphysema. Calcific coronary artery and aortic atherosclerosis.  10/2016 PFTs showed ratio 49, FEV1 of 36%, FVC 58% and DLCO of 34% consistent with severe obstruction.  11/2016 CT chestbullous changes BL, 70mmRUl &2 mm LULnodules >>stable 11/2017  09/2016 echo normal  CBC Latest Ref Rng & Units 02/13/2019 01/28/2019 01/27/2019  WBC 4.0 - 10.5 K/uL 12.5(H) 12.9(H) 12.2(H)  Hemoglobin 12.0 - 15.0 g/dL 12.6 11.3(L) 12.3  Hematocrit 36.0 - 46.0 % 38.6 34.4(L) 36.7  Platelets 150 - 400 K/uL 349 254 205    BMP Latest Ref Rng & Units 02/13/2019 01/28/2019 01/27/2019  Glucose 70 - 99 mg/dL 149(H) 172(H) 376(H)  BUN 6 - 20 mg/dL 12 22(H) 15  Creatinine 0.44 - 1.00 mg/dL 1.08(H) 0.92 1.20(H)  BUN/Creat Ratio 9 - 23 - - -  Sodium 135 - 145 mmol/L 140 138 135  Potassium 3.5 - 5.1 mmol/L 3.1(L) 3.2(L) 3.6  Chloride 98 - 111 mmol/L 97(L) 100 99  CO2 22 - 32 mmol/L 32 30 21(L)  Calcium 8.9 - 10.3 mg/dL 9.0 8.7(L) 8.4(L)    BNP  Component Value Date/Time   BNP 114.5 (H) 12/29/2017 0609    ProBNP No results found for: PROBNP  PFT    Component Value Date/Time   FEV1PRE 1.06 03/17/2019 1444   FEV1POST 1.24 03/17/2019 1444   FVCPRE 2.35 03/17/2019 1444   FVCPOST 2.66 03/17/2019 1444   TLC 5.72 03/17/2019 1444   DLCOUNC 9.80 03/17/2019 1444   PREFEV1FVCRT 45 03/17/2019 1444   PSTFEV1FVCRT 47 03/17/2019 1444    No results found.   Past medical hx Past Medical  History:  Diagnosis Date  . Acute respiratory failure with hypoxia ([place])   . Alcoholism and drug addiction in family   . Anxiety   . Arthritis    "knees, hips, lower back" (09/25/2016)  . Asthma   . Bipolar disorder ([place])   . Chronic bronchitis ([place])    "all the time" (09/25/2016)  . Chronic leg pain    RLE  . Chronic lower back pain   . COPD (chronic obstructive pulmonary disease) ([place])   . Depression   . Diabetes mellitus without complication ([place])   . Emphysema of lung ([place])   . GERD (gastroesophageal reflux disease)   . Headache   . Hepatitis C    "treated back in 2001-2002"  . High cholesterol   . Hypertension   . Hypothyroidism   . Incarcerated ventral hernia 04/04/2017  . On home oxygen therapy    "2L; at nighttime" (04/04/2017)  . OSA on CPAP    CPAP with Oxygen 2 liters  . Pneumonia    "all the time" (09/25/2016)  . Positive PPD    with negative chest x ray  . Tachycardia    patient reports with exertion ; denies any other conditions   . Thyroid disease      Social History   Tobacco Use  . Smoking status: Former Smoker    Packs/day: 1.00    Years: 44.00    Pack years: 44.00    Types: Cigarettes    Quit date: 11/30/2018    Years since quitting: 0.2  . Smokeless tobacco: Never Used  . Tobacco comment: Smoker 1-1.5 PPD every day, 11-20-2018 "i got myself down to half a pack", i am gonna be going on chantix on the 28th"  Substance Use Topics  . Alcohol use: Not Currently  . Drug use: Not Currently    Types: "Crack" cocaine    Comment: 04/04/2017 "nothing since 01/12/2012"    Ms.Michel reports that she quit smoking about 3 months ago. Her smoking use included cigarettes. She has a 44.00 pack-year smoking history. She has never used smokeless tobacco. She reports previous alcohol use. She reports previous drug use. Drug: "Crack" cocaine.  Tobacco Cessation: Counseling given: Yes Comment: Smoker 1-1.5 PPD every day, 11-20-2018 "i got myself down to half a pack", i am  gonna be going on chantix on the 28th"  I have spent 4 minutes counseling patient to remain smoke free. She had her last cigarette 11/2018.    Past surgical hx, Family hx, Social hx all reviewed.  Current Outpatient Medications on File Prior to Visit  Medication Sig  . acyclovir (ZOVIRAX) 400 MG tablet Take 1 tablet (400 mg total) by mouth 2 (two) times daily. (Patient taking differently: Take 400 mg by mouth daily. )  . albuterol (PROVENTIL) (2.5 MG/3ML) 0.083% nebulizer solution Take 3 mLs (2.5 mg total) by nebulization 2 (two) times daily. And every 6 hours as needed.  DX: J44.9 (Patient taking differently: Take 2.5 mg by  nebulization 2 (two) times daily as needed. And every 6 hours as needed.  DX: J44.9)  . atenolol (TENORMIN) 25 MG tablet TAKE 1 TABLET BY MOUTH  DAILY  . atorvastatin (LIPITOR) 20 MG tablet TAKE 1 TABLET BY MOUTH  DAILY  . budesonide-formoterol (SYMBICORT) 160-4.5 MCG/ACT inhaler Inhale 2 puffs into the lungs 2 (two) times daily.  Marland Kitchen buPROPion (WELLBUTRIN XL) 150 MG 24 hr tablet Take 1 tablet (150 mg total) by mouth 2 (two) times daily. (Patient taking differently: Take 150 mg by mouth daily. )  . cyclobenzaprine (FLEXERIL) 10 MG tablet Take 1 tablet (10 mg total) by mouth at bedtime.  . diphenhydrAMINE (BENADRYL) 25 MG tablet Take 25 mg by mouth 2 (two) times daily.   Marland Kitchen docusate sodium (COLACE) 100 MG capsule Take 1 capsule (100 mg total) by mouth 2 (two) times daily.  Marland Kitchen gabapentin (NEURONTIN) 300 MG capsule TAKE 1 CAPSULE BY MOUTH  TWICE DAILY  . hydrochlorothiazide (HYDRODIURIL) 25 MG tablet TAKE 1 TABLET BY MOUTH  DAILY  . HYDROcodone-acetaminophen (NORCO) 7.5-325 MG tablet Take 1-2 tablets by mouth every 4 (four) hours as needed for moderate pain.  . Ipratropium-Albuterol (COMBIVENT RESPIMAT) 20-100 MCG/ACT AERS respimat Inhale 1 puff into the lungs every 6 (six) hours as needed for wheezing. (Patient taking differently: Inhale 2 puffs into the lungs every 6 (six) hours as  needed for wheezing. )  . levothyroxine (SYNTHROID) 25 MCG tablet TAKE 1 TABLET BY MOUTH  DAILY BEFORE BREAKFAST  . Liniments (SALONPAS ARTHRITIS PAIN RELIEF EX) Apply 1 patch topically daily as needed (pain).  . Melatonin 10 MG TABS Take 10 mg by mouth at bedtime.  . montelukast (SINGULAIR) 10 MG tablet Take 1 tablet (10 mg total) by mouth at bedtime.  . Multiple Vitamins-Minerals (WOMENS 50+ MULTI VITAMIN/MIN PO) Take 1 tablet by mouth daily.  Marland Kitchen nystatin-triamcinolone ointment (MYCOLOG) Apply 1 application topically 2 (two) times daily as needed (applied to skin folds/groins/under breast skin irritation rash.).  Marland Kitchen OXYGEN Inhale 2 L into the lungs at bedtime. May use 2.5 - 3 L during the day with strenuous activity Plugged it to CPAP  . pantoprazole (PROTONIX) 40 MG tablet TAKE 1 TABLET BY MOUTH  DAILY  . polyethylene glycol (MIRALAX / GLYCOLAX) 17 g packet Take 17 g by mouth 2 (two) times daily.  . QUEtiapine (SEROQUEL) 50 MG tablet Take 1 tablet (50 mg total) by mouth at bedtime.  . rivaroxaban (XARELTO) 10 MG TABS tablet Take 1 tablet (10 mg total) by mouth daily.  . roflumilast (DALIRESP) 500 MCG TABS tablet Take 1 tablet (500 mcg total) by mouth daily.  . traZODone (DESYREL) 100 MG tablet TAKE 1 TABLET BY MOUTH AT  BEDTIME (Patient taking differently: Take 100 mg by mouth at bedtime. )  . venlafaxine XR (EFFEXOR-XR) 150 MG 24 hr capsule TAKE 1 CAPSULE BY MOUTH  DAILY WITH BREAKFAST (Patient taking differently: Take 150 mg by mouth daily with breakfast. TAKE 1 CAPSULE BY MOUTH  DAILY WITH BREAKFAST)  . ferrous sulfate (FERROUSUL) 325 (65 FE) MG tablet Take 1 tablet (325 mg total) by mouth 3 (three) times daily with meals for 14 days.  Marland Kitchen HYDROcodone-acetaminophen (NORCO/VICODIN) 5-325 MG tablet Take 1 tablet by mouth every 6 (six) hours as needed for moderate pain.  . nicotine (NICODERM CQ) 14 mg/24hr patch Place 1 patch (14 mg total) onto the skin daily.  . varenicline (CHANTIX STARTING MONTH  PAK) 0.5 MG X 11 & 1 MG X 42 tablet Take  one 0.5 mg tablet by mouth once daily for 3 days, then increase to one 0.5 mg tablet twice daily for 4 days, then increase to one 1 mg tablet twice daily. (Patient taking differently: Take 0.5 mg by mouth 2 (two) times daily. )   No current facility-administered medications on file prior to visit.     Allergies  Allergen Reactions  . Keflex [Cephalexin] Other (See Comments)    unspecified  . Prednisone Other (See Comments)    "counter reacts"  . Shellfish Allergy Other (See Comments)    Stomach cramps  . Tetracyclines & Related Other (See Comments)    unspecified  . Dilaudid [Hydromorphone Hcl] Other (See Comments)    "Lethargy"  . Incruse Ellipta [Umeclidinium Bromide] Nausea Only  . Tuberculin Tests Other (See Comments)    False postive    Review Of Systems:  Constitutional:   No  weight loss, night sweats,  Fevers, chills, fatigue, or  lassitude.  HEENT:   No headaches,  Difficulty swallowing,  Tooth/dental problems, or  Sore throat,                No sneezing, itching, ear ache, nasal congestion, post nasal drip,   CV:  No chest pain,  Orthopnea, PND, + trace swelling in right foot, anasarca, dizziness, palpitations, syncope.   GI  No heartburn, indigestion, abdominal pain, nausea, vomiting, diarrhea, change in bowel habits, loss of appetite, bloody stools.   Resp: + shortness of breath with exertion less  at rest.  No excess mucus, no productive cough,  No non-productive cough,  No coughing up of blood.  No change in color of mucus.  No wheezing.  No chest wall deformity  Skin: no rash or lesions.  GU: no dysuria, change in color of urine, no urgency or frequency.  No flank pain, no hematuria   MS:  No joint pain or swelling.  No decreased range of motion.  No back pain. Recent hip replacement   Psych:  No change in mood or affect. No depression or anxiety.  No memory loss.   Vital Signs BP 118/62 (BP Location: Left Arm, Cuff  Size: Normal)   Pulse 74   Ht 5\' 6"  (1.676 m)   Wt 200 lb (90.7 kg)   SpO2 94%   BMI 32.28 kg/m    Physical Exam:  General- No distress,  A&Ox3, pleasant ENT: No sinus tenderness, TM clear, pale nasal mucosa, no oral exudate,no post nasal drip, no LAN Cardiac: S1, S2, regular rate and rhythm, no murmur Chest: No wheeze/ rales/ dullness; no accessory muscle use, no nasal flaring, no sternal retractions, few crackles per bases Abd.: Soft Non-tender, ND, BS + Ext: No clubbing cyanosis, edema Neuro:  Deconditioned and in wheelchair, MAE x 4, A&O x 3,appropriate Skin: No rashes, No lesions, warm and dry Psych: normal mood and behavior   Assessment/Plan  COPD, severe (HCC) Pt. Has quit smoking>> we celebrated Stable interval Compliant with her pulmonary regimen PFT's show severe obstruction F/F ratio has dropped from 49% to 45 %, but DLCO has improved from 34% to 44% Plan Congratulations on quitting smoking Your PFT's show some worsening obstruction, but your diffusion capacity is better, which is good.  Please remain smoke free!! Continue Symbicort160, Daliresp, and Combivent LFT's today ( Therapeutic Drug Monitoring) Nebulizer >> Albuterolas needed for wheezing or shortness of breath. We will place a pharmacy referral to see if meds will continue to be covered. We will send in a referral for  pulmonary rehab Please get your COVID vaccine as soon as you are eligible. Make sure you walk daily Lung Cancer Screening due 11/2019 Follow up in 3 months with Judson Roch NP or Dr. Elsworth Soho    OSA on CPAP Great compliance AHI 2.7 on Down Load Plan Your CPAP down load  looks great. Continue on CPAP at bedtime. You appear to be benefiting from the treatment  Goal is to wear for at least 6 hours each night for maximal clinical benefit. Continue to work on weight loss, as the link between excess weight  and sleep apnea is well established.   Remember to establish a good bedtime routine,  and work on sleep hygiene.  Limit daytime naps , avoid stimulants such as caffeine and nicotine close to bedtime, exercise daily to promote sleep quality, avoid heavy , spicy, fried , or rich foods before bed. Ensure adequate exposure to natural light during the day,establish a relaxing bedtime routine with a pleasant sleep environment ( Bedroom between 60 and 67 degrees, turn off bright lights , TV or device screens screens , consider black out curtains or white noise machines) Do not drive if sleepy. Remember to clean mask, tubing, filter, and reservoir once weekly with soapy water.  We will do LFT's today We will call you with results Follow up with Dr. Elsworth Soho or Judson Roch NP  In 3  or before as needed.   Please contact office for sooner follow up if symptoms do not improve or worsen or seek emergency care     I  provided 45 minutes of care today  [name], NP 03/17/2019  5:00 PM

## 2019-03-17 NOTE — Assessment & Plan Note (Addendum)
Pt. Has quit smoking>> we celebrated Stable interval Compliant with her pulmonary regimen PFT's show severe obstruction F/F ratio has dropped from 49% to 45 %, but DLCO has improved from 34% to 44% Plan Congratulations on quitting smoking Your PFT's show some worsening obstruction, but your diffusion capacity is better, which is good.  Please remain smoke free!! Continue Symbicort160, Daliresp, and Combivent LFT's today ( Therapeutic Drug Monitoring) Nebulizer >> Albuterolas needed for wheezing or shortness of breath. We will place a pharmacy referral to see if meds will continue to be covered. We will send in a referral for pulmonary rehab Please get your COVID vaccine as soon as you are eligible. Make sure you walk daily Lung Cancer Screening due 11/2019 Follow up in 3 months with Judson Roch NP or Dr. Elsworth Soho

## 2019-03-17 NOTE — Patient Outreach (Signed)
[facility]) Care Management  03/17/2019  Joan [name] 1961/01/06 [id number]   RN Health Coach attempted follow up outreach call to patient.  Patient was unavailable. HIPPA compliance voicemail message left with return callback number.  Plan: RN will call patient again within 30 days.  [place] Care Management [phone]

## 2019-03-17 NOTE — Progress Notes (Signed)
PFT done today. 

## 2019-03-18 ENCOUNTER — Ambulatory Visit (INDEPENDENT_AMBULATORY_CARE_PROVIDER_SITE_OTHER): Payer: Medicare Other | Admitting: Licensed Clinical Social Worker

## 2019-03-18 ENCOUNTER — Other Ambulatory Visit: Payer: Self-pay | Admitting: Pulmonary Disease

## 2019-03-18 DIAGNOSIS — F0630 Mood disorder due to known physiological condition, unspecified: Secondary | ICD-10-CM | POA: Diagnosis not present

## 2019-03-18 DIAGNOSIS — F4312 Post-traumatic stress disorder, chronic: Secondary | ICD-10-CM

## 2019-03-18 DIAGNOSIS — R443 Hallucinations, unspecified: Secondary | ICD-10-CM

## 2019-03-18 DIAGNOSIS — F411 Generalized anxiety disorder: Secondary | ICD-10-CM

## 2019-03-18 LAB — HEPATIC FUNCTION PANEL
ALT: 21 U/L (ref 0–35)
AST: 19 U/L (ref 0–37)
Albumin: 4.1 g/dL (ref 3.5–5.2)
Alkaline Phosphatase: 150 U/L — ABNORMAL HIGH (ref 39–117)
Bilirubin, Direct: 0.1 mg/dL (ref 0.0–0.3)
Total Bilirubin: 0.3 mg/dL (ref 0.2–1.2)
Total Protein: 6.9 g/dL (ref 6.0–8.3)

## 2019-03-18 NOTE — Progress Notes (Signed)
Please call patient and let her know LFT's are fine with the exception alk phos. Have her decrease her Daliresp to 250 mg daily, and we will recheck labs in 1 month.This could also  be her statin, so we will have to monitor. Thanks, and please order LFT's for 1 month

## 2019-03-18 NOTE — Progress Notes (Signed)
Virtual Visit via Telephone Note  I connected with Joan [name] on 03/18/19 at  1:00 PM EST by telephone and verified that I am speaking with the correct person using two identifiers.   I discussed the limitations, risks, security and privacy concerns of performing an evaluation and management service by telephone and the availability of in person appointments. I also discussed with the patient that there may be a patient responsible charge related to this service. The patient expressed understanding and agreed to proceed.   I discussed the assessment and treatment plan with the patient. The patient was provided an opportunity to ask questions and all were answered. The patient agreed with the plan and demonstrated an understanding of the instructions.   The patient was advised to call back or seek an in-person evaluation if the symptoms worsen or if the condition fails to improve as anticipated.  I provided 52 minutes of non-face-to-face time during this encounter.  THERAPIST PROGRESS NOTE  Session Time: 1:05 PM to 1:57 PM  Participation Level: Active  Behavioral Response: CasualAlertEuthymic  Type of Therapy: Individual Therapy  Treatment Goals addressed:  PTSD, coping, hallucinations  Interventions: Solution Focused, Strength-based, Supportive and Other: coping  Summary: Joan [name] is a 59 y.o. female who presents with appointment with hip this week. Went to see the doctor for it and he said that she has a long way to go with the healing, because leg pulled out right after surgery. Don't have to wear the brace and can start with light exercise. Lung doctor went to see her yesterday and lungs were a little better, patient said some tests showed a lot better and others a little better. She knows that she is getting better. Upset because when go to bed have to climb up my bed it is up high and big mattress and out of breath and that bothers her. Describes being happy about the news from  the tests done. "I still kind of hear things in a distance. I want to say I still see people in the house and it is still vivid." She thinks medication would help if it was psychotic symptoms. Thinks a 6th sense. Does not feel a threat by these visions. The symptoms are not bothering her and it was more that people not believing her. This has been going on since husband passed. Talked about when people dying that people get this. When he died I felt his presence with him.  Reviewed progress and that symptoms do not bother him as much. She doesn't think paranoia  is going to stop. Discussed incident of one cough medicine with hydrocodone gone. Prescription for pain medication for leg. They are slowly disappearing. Therapist discussed it is reasonable to have a  suspicion about somebody taking itand and if reasonable then not paranoid. Patient shares that  being paranoid thrown in her face.  Reviewed with patient this can be a strategy so that other people are not held accountable.  Patient shares her brother is. "narcissitic", this term was used after discussing with daughter.  Scribe what she means by narcissistic, a person that makes it look like it is somebody else did it. Discussed positive step that patient setting boundaries with him. Relates that if she corrects him there is a war he has to be right all the time. Patient is the one who is always wrong.  Patient wanted to find positive in the relationship and shares once in awhile that he helps. Has been there  3-4 years. Learned to let things go. Pick and choose argument. She is much calmer than used to be. She doesn't have the energy so let things go. He is right about some areas and she tries to change those things, and shares that he is good to her. She wants him to help her though because he wants to not because has to. Points out things he does makes her feel that she does not appreciate it. "Learned to be quiet". Doesn't like where it goes when argue  because argue and goes to a place don't want it to go. Rather do for each other and help each other. Don't want to go to meetings here don't want to know people who use. "Totally content with what I am doing." Brother thinks should go out and meet friends. I am I am quieting my life down, as opposed to before and out there. I feel really content.  Patient does say she like to get into ceramics, loves arts and crafts therapist related she will encourage patient with these activities.  She has a pulmonary exercise program, and signed up by her doctor to go to exercise gym.  Discussed this is very beneficial for mental health and her physical health. Patient relates she is content with doing nothing and "that is where I get stuck."  Does have an exercise bike that can pedal and also work her arms, discussed setting up a timer and walking for a certain amount of minutes.  If is related will review progress with exercise at next session. Suicidal/Homicidal: No  Therapist Response: Therapist reviewed symptoms, discussed stressors, facilitated expression of thoughts and feelings work today with patient and coping strategies to deal with stressors in relationship with brother.  Provided positive feedback for setting good boundaries with him, letting things go as often not worth arguing about things and it only makes it worse.  Reviewed psychotic symptoms which continue but focused on, distressing they were which was a significant issue when she started treatment and that has decreased, therapist emphasizing even if meds do not take away all symptoms important that she has quality of life and appears wellbeing is improving.  Provided broader perspective of positive steps patient is taking to improve her health getting to a better place in her life and how this is rewarding and helps with mood.  Encourage exercise as this is very helpful for mental health and health, explored activities for patient to enjoy as she limits  her activity and will continue to encourage as this will help with positive impact on her life.  Plan: Return again in 2 weeks.2.Therapist work with patient on mood regulation, stress management, management of symptoms, explore and help work on grief and PTSD symptoms as needed Diagnosis: Axis I:   PTSD, hallucinations, GAD, Mood Disorder in conditions classified elsewhere    Axis II: No diagnosis    [name], LCSW 03/18/2019

## 2019-03-20 ENCOUNTER — Other Ambulatory Visit (HOSPITAL_COMMUNITY): Payer: Self-pay | Admitting: Psychiatry

## 2019-03-25 DIAGNOSIS — J449 Chronic obstructive pulmonary disease, unspecified: Secondary | ICD-10-CM | POA: Diagnosis not present

## 2019-03-25 DIAGNOSIS — G4733 Obstructive sleep apnea (adult) (pediatric): Secondary | ICD-10-CM | POA: Diagnosis not present

## 2019-03-25 DIAGNOSIS — J9611 Chronic respiratory failure with hypoxia: Secondary | ICD-10-CM | POA: Diagnosis not present

## 2019-03-25 DIAGNOSIS — J441 Chronic obstructive pulmonary disease with (acute) exacerbation: Secondary | ICD-10-CM | POA: Diagnosis not present

## 2019-03-26 ENCOUNTER — Other Ambulatory Visit: Payer: Self-pay | Admitting: *Deleted

## 2019-03-26 ENCOUNTER — Telehealth: Payer: Self-pay | Admitting: Pharmacy Technician

## 2019-03-26 NOTE — Telephone Encounter (Signed)
Patient called office to check status on patient assistance applications. Advised patient that she was approved through 10/2019 for [employer] [place]- provided patient phone number to call to schedule shipment.  Phone# [phone]  Called [employer] to follow up on Combivent PAP sent in Dec, rep Clarise Cruz advised fax received did not come through clearly, requested refax. Refaxed application, will follow up on Monday.  Phone# [phone]  Patient requested rx for Albuterol nebs be sent to [facility] order [facility].  4:02 PM Beatriz Chancellor, [name]

## 2019-03-28 ENCOUNTER — Other Ambulatory Visit (HOSPITAL_COMMUNITY): Payer: Self-pay | Admitting: Psychiatry

## 2019-03-30 ENCOUNTER — Other Ambulatory Visit: Payer: Self-pay

## 2019-03-30 ENCOUNTER — Encounter (HOSPITAL_COMMUNITY): Payer: Self-pay

## 2019-03-30 ENCOUNTER — Inpatient Hospital Stay (HOSPITAL_COMMUNITY)
Admission: EM | Admit: 2019-03-30 | Discharge: 2019-04-14 | DRG: 339 | Disposition: A | Discharge status: Home Health | Payer: Medicare HMO | Attending: Physician Assistant | Admitting: Physician Assistant

## 2019-03-30 ENCOUNTER — Emergency Department (HOSPITAL_COMMUNITY): Payer: Medicare HMO

## 2019-03-30 DIAGNOSIS — G4733 Obstructive sleep apnea (adult) (pediatric): Secondary | ICD-10-CM | POA: Diagnosis present

## 2019-03-30 DIAGNOSIS — K567 Ileus, unspecified: Secondary | ICD-10-CM | POA: Diagnosis not present

## 2019-03-30 DIAGNOSIS — E1169 Type 2 diabetes mellitus with other specified complication: Secondary | ICD-10-CM | POA: Diagnosis present

## 2019-03-30 DIAGNOSIS — Z743 Need for continuous supervision: Secondary | ICD-10-CM | POA: Diagnosis not present

## 2019-03-30 DIAGNOSIS — J410 Simple chronic bronchitis: Secondary | ICD-10-CM

## 2019-03-30 DIAGNOSIS — Z7951 Long term (current) use of inhaled steroids: Secondary | ICD-10-CM | POA: Diagnosis not present

## 2019-03-30 DIAGNOSIS — Z79890 Hormone replacement therapy (postmenopausal): Secondary | ICD-10-CM

## 2019-03-30 DIAGNOSIS — R1030 Lower abdominal pain, unspecified: Secondary | ICD-10-CM | POA: Diagnosis not present

## 2019-03-30 DIAGNOSIS — Z833 Family history of diabetes mellitus: Secondary | ICD-10-CM

## 2019-03-30 DIAGNOSIS — Z79899 Other long term (current) drug therapy: Secondary | ICD-10-CM | POA: Diagnosis not present

## 2019-03-30 DIAGNOSIS — Z801 Family history of malignant neoplasm of trachea, bronchus and lung: Secondary | ICD-10-CM | POA: Diagnosis not present

## 2019-03-30 DIAGNOSIS — F419 Anxiety disorder, unspecified: Secondary | ICD-10-CM | POA: Diagnosis present

## 2019-03-30 DIAGNOSIS — E785 Hyperlipidemia, unspecified: Secondary | ICD-10-CM | POA: Diagnosis not present

## 2019-03-30 DIAGNOSIS — K3532 Acute appendicitis with perforation and localized peritonitis, without abscess: Secondary | ICD-10-CM | POA: Diagnosis not present

## 2019-03-30 DIAGNOSIS — Z4682 Encounter for fitting and adjustment of non-vascular catheter: Secondary | ICD-10-CM | POA: Diagnosis not present

## 2019-03-30 DIAGNOSIS — K56609 Unspecified intestinal obstruction, unspecified as to partial versus complete obstruction: Secondary | ICD-10-CM | POA: Diagnosis not present

## 2019-03-30 DIAGNOSIS — Z9981 Dependence on supplemental oxygen: Secondary | ICD-10-CM | POA: Diagnosis not present

## 2019-03-30 DIAGNOSIS — Z87891 Personal history of nicotine dependence: Secondary | ICD-10-CM | POA: Diagnosis not present

## 2019-03-30 DIAGNOSIS — Z881 Allergy status to other antibiotic agents status: Secondary | ICD-10-CM

## 2019-03-30 DIAGNOSIS — Z8249 Family history of ischemic heart disease and other diseases of the circulatory system: Secondary | ICD-10-CM | POA: Diagnosis not present

## 2019-03-30 DIAGNOSIS — M545 Low back pain: Secondary | ICD-10-CM | POA: Diagnosis not present

## 2019-03-30 DIAGNOSIS — E876 Hypokalemia: Secondary | ICD-10-CM | POA: Diagnosis not present

## 2019-03-30 DIAGNOSIS — J9611 Chronic respiratory failure with hypoxia: Secondary | ICD-10-CM | POA: Diagnosis present

## 2019-03-30 DIAGNOSIS — I10 Essential (primary) hypertension: Secondary | ICD-10-CM | POA: Diagnosis present

## 2019-03-30 DIAGNOSIS — J449 Chronic obstructive pulmonary disease, unspecified: Secondary | ICD-10-CM | POA: Diagnosis present

## 2019-03-30 DIAGNOSIS — Z0189 Encounter for other specified special examinations: Secondary | ICD-10-CM

## 2019-03-30 DIAGNOSIS — E7800 Pure hypercholesterolemia, unspecified: Secondary | ICD-10-CM | POA: Diagnosis present

## 2019-03-30 DIAGNOSIS — F319 Bipolar disorder, unspecified: Secondary | ICD-10-CM | POA: Diagnosis present

## 2019-03-30 DIAGNOSIS — Z03818 Encounter for observation for suspected exposure to other biological agents ruled out: Secondary | ICD-10-CM | POA: Diagnosis not present

## 2019-03-30 DIAGNOSIS — F4312 Post-traumatic stress disorder, chronic: Secondary | ICD-10-CM | POA: Diagnosis not present

## 2019-03-30 DIAGNOSIS — Z6832 Body mass index (BMI) 32.0-32.9, adult: Secondary | ICD-10-CM

## 2019-03-30 DIAGNOSIS — E782 Mixed hyperlipidemia: Secondary | ICD-10-CM | POA: Diagnosis present

## 2019-03-30 DIAGNOSIS — Z7901 Long term (current) use of anticoagulants: Secondary | ICD-10-CM | POA: Diagnosis not present

## 2019-03-30 DIAGNOSIS — J439 Emphysema, unspecified: Secondary | ICD-10-CM | POA: Diagnosis present

## 2019-03-30 DIAGNOSIS — K219 Gastro-esophageal reflux disease without esophagitis: Secondary | ICD-10-CM | POA: Diagnosis not present

## 2019-03-30 DIAGNOSIS — R69 Illness, unspecified: Secondary | ICD-10-CM | POA: Diagnosis not present

## 2019-03-30 DIAGNOSIS — Z20822 Contact with and (suspected) exposure to covid-19: Secondary | ICD-10-CM | POA: Diagnosis present

## 2019-03-30 DIAGNOSIS — Z91013 Allergy to seafood: Secondary | ICD-10-CM

## 2019-03-30 DIAGNOSIS — R1084 Generalized abdominal pain: Secondary | ICD-10-CM | POA: Diagnosis not present

## 2019-03-30 DIAGNOSIS — J441 Chronic obstructive pulmonary disease with (acute) exacerbation: Secondary | ICD-10-CM | POA: Diagnosis not present

## 2019-03-30 DIAGNOSIS — K352 Acute appendicitis with generalized peritonitis, without abscess: Secondary | ICD-10-CM | POA: Diagnosis not present

## 2019-03-30 DIAGNOSIS — R140 Abdominal distension (gaseous): Secondary | ICD-10-CM

## 2019-03-30 DIAGNOSIS — Z888 Allergy status to other drugs, medicaments and biological substances status: Secondary | ICD-10-CM

## 2019-03-30 DIAGNOSIS — K3580 Unspecified acute appendicitis: Secondary | ICD-10-CM

## 2019-03-30 DIAGNOSIS — F411 Generalized anxiety disorder: Secondary | ICD-10-CM | POA: Diagnosis not present

## 2019-03-30 DIAGNOSIS — E119 Type 2 diabetes mellitus without complications: Secondary | ICD-10-CM | POA: Diagnosis present

## 2019-03-30 DIAGNOSIS — M1990 Unspecified osteoarthritis, unspecified site: Secondary | ICD-10-CM | POA: Diagnosis not present

## 2019-03-30 DIAGNOSIS — G8929 Other chronic pain: Secondary | ICD-10-CM | POA: Diagnosis not present

## 2019-03-30 DIAGNOSIS — E669 Obesity, unspecified: Secondary | ICD-10-CM | POA: Diagnosis present

## 2019-03-30 DIAGNOSIS — F0630 Mood disorder due to known physiological condition, unspecified: Secondary | ICD-10-CM | POA: Diagnosis not present

## 2019-03-30 DIAGNOSIS — Z96643 Presence of artificial hip joint, bilateral: Secondary | ICD-10-CM | POA: Diagnosis present

## 2019-03-30 DIAGNOSIS — K353 Acute appendicitis with localized peritonitis, without perforation or gangrene: Secondary | ICD-10-CM | POA: Diagnosis not present

## 2019-03-30 DIAGNOSIS — R443 Hallucinations, unspecified: Secondary | ICD-10-CM | POA: Diagnosis not present

## 2019-03-30 DIAGNOSIS — R062 Wheezing: Secondary | ICD-10-CM | POA: Diagnosis not present

## 2019-03-30 DIAGNOSIS — R1031 Right lower quadrant pain: Secondary | ICD-10-CM | POA: Diagnosis not present

## 2019-03-30 DIAGNOSIS — K2100 Gastro-esophageal reflux disease with esophagitis, without bleeding: Secondary | ICD-10-CM | POA: Diagnosis present

## 2019-03-30 DIAGNOSIS — Z885 Allergy status to narcotic agent status: Secondary | ICD-10-CM

## 2019-03-30 DIAGNOSIS — K3533 Acute appendicitis with perforation and localized peritonitis, with abscess: Secondary | ICD-10-CM | POA: Diagnosis not present

## 2019-03-30 DIAGNOSIS — E034 Atrophy of thyroid (acquired): Secondary | ICD-10-CM | POA: Diagnosis present

## 2019-03-30 LAB — CBC
HCT: 43.1 % (ref 36.0–46.0)
Hemoglobin: 14.4 g/dL (ref 12.0–15.0)
MCH: 31.2 pg (ref 26.0–34.0)
MCHC: 33.4 g/dL (ref 30.0–36.0)
MCV: 93.3 fL (ref 80.0–100.0)
Platelets: 299 10*3/uL (ref 150–400)
RBC: 4.62 MIL/uL (ref 3.87–5.11)
RDW: 13.4 % (ref 11.5–15.5)
WBC: 21 10*3/uL — ABNORMAL HIGH (ref 4.0–10.5)
nRBC: 0 % (ref 0.0–0.2)

## 2019-03-30 LAB — COMPREHENSIVE METABOLIC PANEL
ALT: 31 U/L (ref 0–44)
AST: 25 U/L (ref 15–41)
Albumin: 4.2 g/dL (ref 3.5–5.0)
Alkaline Phosphatase: 135 U/L — ABNORMAL HIGH (ref 38–126)
Anion gap: 14 (ref 5–15)
BUN: 14 mg/dL (ref 6–20)
CO2: 26 mmol/L (ref 22–32)
Calcium: 9.7 mg/dL (ref 8.9–10.3)
Chloride: 96 mmol/L — ABNORMAL LOW (ref 98–111)
Creatinine, Ser: 1.06 mg/dL — ABNORMAL HIGH (ref 0.44–1.00)
GFR calc Af Amer: >60 mL/min (ref >60)
GFR calc non Af Amer: 58 mL/min — ABNORMAL LOW (ref >60)
Glucose, Bld: 142 mg/dL — ABNORMAL HIGH (ref 70–99)
Potassium: 3.8 mmol/L (ref 3.5–5.1)
Sodium: 136 mmol/L (ref 135–145)
Total Bilirubin: 0.9 mg/dL (ref 0.3–1.2)
Total Protein: 7.4 g/dL (ref 6.5–8.1)

## 2019-03-30 LAB — TSH: TSH: 1.156 u[IU]/mL (ref 0.350–4.500)

## 2019-03-30 LAB — RESPIRATORY PANEL BY RT PCR (FLU A&B, COVID)
Influenza A by PCR: NEGATIVE
Influenza B by PCR: NEGATIVE
SARS Coronavirus 2 by RT PCR: NEGATIVE

## 2019-03-30 LAB — URINALYSIS, ROUTINE W REFLEX MICROSCOPIC
Bacteria, UA: NONE SEEN
Bilirubin Urine: NEGATIVE
Glucose, UA: NEGATIVE mg/dL
Ketones, ur: NEGATIVE mg/dL
Leukocytes,Ua: NEGATIVE
Nitrite: NEGATIVE
Protein, ur: NEGATIVE mg/dL
Specific Gravity, Urine: 1.045 — ABNORMAL HIGH (ref 1.005–1.030)
pH: 7 (ref 5.0–8.0)

## 2019-03-30 LAB — LIPASE, BLOOD: Lipase: 21 U/L (ref 11–51)

## 2019-03-30 LAB — GLUCOSE, CAPILLARY: Glucose-Capillary: 81 mg/dL (ref 70–99)

## 2019-03-30 MED ORDER — FENTANYL CITRATE (PF) 100 MCG/2ML IJ SOLN
100.0000 ug | INTRAMUSCULAR | Status: DC | PRN
  Administered 2019-03-31 (×4): 100 ug via INTRAVENOUS
  Filled 2019-03-30 (×4): qty 2

## 2019-03-30 MED ORDER — ACETAMINOPHEN 650 MG RE SUPP: 650.0000 mg | Freq: Four times a day (QID) | RECTAL | Status: DC | PRN

## 2019-03-30 MED ORDER — KETOROLAC TROMETHAMINE 30 MG/ML IJ SOLN
30.0000 mg | Freq: Four times a day (QID) | INTRAMUSCULAR | Status: DC | PRN
  Administered 2019-03-30: 30 mg via INTRAVENOUS
  Filled 2019-03-30: qty 1

## 2019-03-30 MED ORDER — METOPROLOL TARTRATE 5 MG/5ML IV SOLN
2.5000 mg | Freq: Four times a day (QID) | INTRAVENOUS | Status: DC
  Administered 2019-03-30 – 2019-04-13 (×50): 2.5 mg via INTRAVENOUS
  Filled 2019-03-30 (×51): qty 5

## 2019-03-30 MED ORDER — OXYCODONE HCL 5 MG PO TABS
5.0000 mg | ORAL_TABLET | ORAL | Status: DC | PRN
  Administered 2019-03-31: 10 mg via ORAL
  Filled 2019-03-30: qty 2

## 2019-03-30 MED ORDER — MORPHINE SULFATE (PF) 4 MG/ML IV SOLN
4.0000 mg | Freq: Once | INTRAVENOUS | Status: AC
  Administered 2019-03-30: 4 mg via INTRAVENOUS
  Filled 2019-03-30: qty 1

## 2019-03-30 MED ORDER — ACETAMINOPHEN 325 MG PO TABS: 650.0000 mg | ORAL_TABLET | Freq: Four times a day (QID) | ORAL | Status: DC | PRN

## 2019-03-30 MED ORDER — SODIUM CHLORIDE 0.9% FLUSH: 3.0000 mL | Freq: Once | INTRAVENOUS | Status: DC

## 2019-03-30 MED ORDER — ONDANSETRON HCL 4 MG/2ML IJ SOLN
4.0000 mg | Freq: Once | INTRAMUSCULAR | Status: AC
  Administered 2019-03-30: 4 mg via INTRAVENOUS
  Filled 2019-03-30: qty 2

## 2019-03-30 MED ORDER — IPRATROPIUM-ALBUTEROL 0.5-2.5 (3) MG/3ML IN SOLN
3.0000 mL | Freq: Two times a day (BID) | RESPIRATORY_TRACT | Status: DC
  Administered 2019-03-31 – 2019-04-14 (×26): 3 mL via RESPIRATORY_TRACT
  Filled 2019-03-30 (×29): qty 3

## 2019-03-30 MED ORDER — METRONIDAZOLE IN NACL 5-0.79 MG/ML-% IV SOLN
500.0000 mg | Freq: Three times a day (TID) | INTRAVENOUS | Status: DC
  Administered 2019-03-31 – 2019-04-08 (×26): 500 mg via INTRAVENOUS
  Filled 2019-03-30 (×25): qty 100

## 2019-03-30 MED ORDER — INSULIN ASPART 100 UNIT/ML ~~LOC~~ SOLN
0.0000 [IU] | SUBCUTANEOUS | Status: DC
  Administered 2019-03-31 – 2019-04-02 (×5): 1 [IU] via SUBCUTANEOUS
  Administered 2019-04-02: 2 [IU] via SUBCUTANEOUS
  Administered 2019-04-02: 1 [IU] via SUBCUTANEOUS
  Administered 2019-04-03: 2 [IU] via SUBCUTANEOUS
  Administered 2019-04-04 – 2019-04-13 (×15): 1 [IU] via SUBCUTANEOUS
  Filled 2019-03-30: qty 0.09

## 2019-03-30 MED ORDER — IOHEXOL 300 MG/ML  SOLN
100.0000 mL | Freq: Once | INTRAMUSCULAR | Status: AC | PRN
  Administered 2019-03-30: 16:00:00 100 mL via INTRAVENOUS

## 2019-03-30 MED ORDER — ENOXAPARIN SODIUM 40 MG/0.4ML ~~LOC~~ SOLN
40.0000 mg | SUBCUTANEOUS | Status: DC
  Administered 2019-03-30: 40 mg via SUBCUTANEOUS
  Filled 2019-03-30: qty 0.4

## 2019-03-30 MED ORDER — LEVOTHYROXINE SODIUM 100 MCG/5ML IV SOLN
12.5000 ug | Freq: Every day | INTRAVENOUS | Status: DC
  Administered 2019-03-31 – 2019-04-01 (×2): 12.5 ug via INTRAVENOUS
  Filled 2019-03-30 (×2): qty 5

## 2019-03-30 MED ORDER — IPRATROPIUM-ALBUTEROL 0.5-2.5 (3) MG/3ML IN SOLN
3.0000 mL | Freq: Four times a day (QID) | RESPIRATORY_TRACT | Status: DC | PRN
  Administered 2019-03-30 – 2019-04-09 (×2): 3 mL via RESPIRATORY_TRACT
  Filled 2019-03-30: qty 3

## 2019-03-30 MED ORDER — PANTOPRAZOLE SODIUM 40 MG IV SOLR
40.0000 mg | INTRAVENOUS | Status: DC
  Administered 2019-03-31: 40 mg via INTRAVENOUS
  Filled 2019-03-30: qty 40

## 2019-03-30 MED ORDER — CIPROFLOXACIN IN D5W 400 MG/200ML IV SOLN
400.0000 mg | Freq: Two times a day (BID) | INTRAVENOUS | Status: DC
  Administered 2019-03-31 – 2019-04-08 (×17): 400 mg via INTRAVENOUS
  Filled 2019-03-30 (×17): qty 200

## 2019-03-30 MED ORDER — MORPHINE SULFATE (PF) 4 MG/ML IV SOLN
4.0000 mg | INTRAVENOUS | Status: DC | PRN
  Administered 2019-03-30: 4 mg via INTRAVENOUS
  Filled 2019-03-30: qty 1

## 2019-03-30 MED ORDER — MOMETASONE FURO-FORMOTEROL FUM 200-5 MCG/ACT IN AERO
2.0000 | INHALATION_SPRAY | Freq: Two times a day (BID) | RESPIRATORY_TRACT | Status: DC
  Administered 2019-03-30 – 2019-04-14 (×28): 2 via RESPIRATORY_TRACT
  Filled 2019-03-30: qty 8.8

## 2019-03-30 MED ORDER — ONDANSETRON HCL 4 MG/2ML IJ SOLN
4.0000 mg | Freq: Four times a day (QID) | INTRAMUSCULAR | Status: DC | PRN
  Administered 2019-03-30 – 2019-04-12 (×8): 4 mg via INTRAVENOUS
  Filled 2019-03-30 (×8): qty 2

## 2019-03-30 MED ORDER — SODIUM CHLORIDE (PF) 0.9 % IJ SOLN
INTRAMUSCULAR | Status: AC
  Filled 2019-03-30: qty 50

## 2019-03-30 MED ORDER — ONDANSETRON 4 MG PO TBDP: 4.0000 mg | ORAL_TABLET | Freq: Four times a day (QID) | ORAL | Status: DC | PRN

## 2019-03-30 MED ORDER — FENTANYL CITRATE (PF) 100 MCG/2ML IJ SOLN
50.0000 ug | INTRAMUSCULAR | Status: DC | PRN
  Administered 2019-03-30 (×2): 50 ug via INTRAVENOUS
  Filled 2019-03-30 (×2): qty 2

## 2019-03-30 MED ORDER — IPRATROPIUM-ALBUTEROL 20-100 MCG/ACT IN AERS: 2.0000 | INHALATION_SPRAY | Freq: Four times a day (QID) | RESPIRATORY_TRACT | Status: DC | PRN

## 2019-03-30 MED ORDER — METHOCARBAMOL 500 MG PO TABS
500.0000 mg | ORAL_TABLET | Freq: Four times a day (QID) | ORAL | Status: DC | PRN
  Administered 2019-03-31: 500 mg via ORAL
  Filled 2019-03-30: qty 1

## 2019-03-30 MED ORDER — ZOLPIDEM TARTRATE 5 MG PO TABS: 5.0000 mg | ORAL_TABLET | Freq: Every evening | ORAL | Status: DC | PRN

## 2019-03-30 MED ORDER — CIPROFLOXACIN IN D5W 400 MG/200ML IV SOLN
400.0000 mg | Freq: Once | INTRAVENOUS | Status: AC
  Administered 2019-03-30: 17:00:00 400 mg via INTRAVENOUS
  Filled 2019-03-30: qty 200

## 2019-03-30 MED ORDER — METRONIDAZOLE IN NACL 5-0.79 MG/ML-% IV SOLN
500.0000 mg | Freq: Once | INTRAVENOUS | Status: AC
  Administered 2019-03-30: 500 mg via INTRAVENOUS
  Filled 2019-03-30: qty 100

## 2019-03-30 MED ORDER — SODIUM CHLORIDE 0.9 % IV SOLN: INTRAVENOUS | Status: DC

## 2019-03-30 NOTE — H&P (Signed)
Joan [name] is an 59 y.o. female.   Chief Complaint: Abdominal pain HPI: Patient relates a 2-month history of lower abdominal pain.  The pain is located in her lower abdomen.  Last night it became more localized in her right lower quadrant.  She has multiple complex medical problems including severe COPD.  She underwent a laparoscopic incisional hernia repair in 2019 by Dr. Dalbert Batman using a large piece of mesh laparoscopically.  Her pain worsened therefore she sought care today in the emergency room.  She is complaining of right lower quadrant pain which is moderate to severe in nature.  Is made worse with movement.  It is been constant.  CT scan shows acute appendicitis.  There is no obvious appendicolith.  Radiologist felt it was not perforated but upon my review I have concerns that she already has perforated.  She has no hypotension.  She is not tachycardic.  Medicine has been consulted to manage her complex medical problems and severe COPD for which she is on O2 at home.  She has poor stamina.  She has difficulty moving.  Past Medical History:  Diagnosis Date  . Acute respiratory failure with hypoxia ([place])   . Alcoholism and drug addiction in family   . Anxiety   . Arthritis    "knees, hips, lower back" (09/25/2016)  . Asthma   . Bipolar disorder ([place])   . Chronic bronchitis ([place])    "all the time" (09/25/2016)  . Chronic leg pain    RLE  . Chronic lower back pain   . COPD (chronic obstructive pulmonary disease) ([place])   . Depression   . Diabetes mellitus without complication ([place])   . Emphysema of lung ([place])   . GERD (gastroesophageal reflux disease)   . Headache   . Hepatitis C    "treated back in 2001-2002"  . High cholesterol   . Hypertension   . Hypothyroidism   . Incarcerated ventral hernia 04/04/2017  . On home oxygen therapy    "2L; at nighttime" (04/04/2017)  . OSA on CPAP    CPAP with Oxygen 2 liters  . Pneumonia    "all the time" (09/25/2016)  . Positive PPD    with negative  chest x ray  . Tachycardia    patient reports with exertion ; denies any other conditions   . Thyroid disease     Past Surgical History:  Procedure Laterality Date  . BACK SURGERY    . BIOPSY  11/24/2018   Procedure: BIOPSY;  Surgeon: Thornton Park, MD;  Location: [facility];  Service: Gastroenterology;;  . CARDIAC CATHETERIZATION    . COLONOSCOPY    . COLONOSCOPY WITH PROPOFOL N/A 11/24/2018   Procedure: COLONOSCOPY WITH PROPOFOL;  Surgeon: Thornton Park, MD;  Location: [facility];  Service: Gastroenterology;  Laterality: N/A;  . CYST EXCISION     removal of cyst in sinuses  . DILATION AND CURETTAGE OF UTERUS    . ESOPHAGOGASTRODUODENOSCOPY (EGD) WITH PROPOFOL N/A 11/24/2018   Procedure: ESOPHAGOGASTRODUODENOSCOPY (EGD) WITH PROPOFOL;  Surgeon: Thornton Park, MD;  Location: [facility];  Service: Gastroenterology;  Laterality: N/A;  . FOOT SURGERY Bilateral    bone removed from 4th and 5 th toes and put in pin then took pin out  . HERNIA REPAIR    . INCISION AND DRAINAGE  ~ 2014/2015   "removed mesh & infection"  . INCISIONAL HERNIA REPAIR N/A 04/04/2017   Procedure: LAPAROSCOPIC INCISIONAL HERNIA REPAIR WITH MESH;  Surgeon: Fanny Skates, MD;  Location:  [facility];  Service: General;  Laterality: N/A;  . LACERATION REPAIR Right    repair wrist artery from laceration from ice skate  . LAPAROSCOPIC INCISIONAL / UMBILICAL / VENTRAL HERNIA REPAIR  04/04/2017   LAPAROSCOPIC INCISIONAL HERNIA REPAIR WITH MESH  . LIPOMA EXCISION Right    fattty lipoma in neck  . NASAL SEPTUM SURGERY    . POSTERIOR LUMBAR FUSION  2015/2016 X 2   "added rods and screws"  . TOTAL HIP ARTHROPLASTY Right 02/05/2017   Procedure: RIGHT TOTAL HIP ARTHROPLASTY;  Surgeon: [name], MD;  Location: [facility];  Service: Orthopedics;  Laterality: Right;  . TOTAL HIP ARTHROPLASTY Left 06/18/2017   Procedure: LEFT TOTAL HIP ARTHROPLASTY;  Surgeon: [name], MD;  Location: [facility];  Service:  Orthopedics;  Laterality: Left;  . TOTAL HIP REVISION Left 01/26/2019   Procedure: TOTAL HIP REVISION;  Surgeon: Paralee Cancel, MD;  Location: [facility];  Service: Orthopedics;  Laterality: Left;  2 hrs  . TUBAL LIGATION    . UMBILICAL HERNIA REPAIR  ~ 2013   w/mesh  . UPPER GI ENDOSCOPY      Family History  Problem Relation Age of Onset  . Diabetes Mother   . Hypertension Mother   . Hypertension Father   . Cancer Father        lung   Social History:  reports that she quit smoking about 3 months ago. Her smoking use included cigarettes. She has a 44.00 pack-year smoking history. She has never used smokeless tobacco. She reports previous alcohol use. She reports previous drug use. Drug: "Crack" cocaine.  Allergies:  Allergies  Allergen Reactions  . Keflex [Cephalexin] Other (See Comments)    unspecified  . Prednisone Other (See Comments)    "counter reacts"  . Shellfish Allergy Other (See Comments)    Stomach cramps  . Tetracyclines & Related Other (See Comments)    unspecified  . Dilaudid [Hydromorphone Hcl] Other (See Comments)    "Lethargy"  . Incruse Ellipta [Umeclidinium Bromide] Nausea Only  . Tuberculin Tests Other (See Comments)    False postive    (Not in a hospital admission)   Results for orders placed or performed during the hospital encounter of 03/30/19 (from the past 48 hour(s))  Lipase, blood     Status: None   Collection Time: 03/30/19  1:36 PM  Result Value Ref Range   Lipase 21 11 - 51 U/L    Comment: Performed at [facility], [place] [address], [place], [place] [address]  Comprehensive metabolic panel     Status: Abnormal   Collection Time: 03/30/19  1:36 PM  Result Value Ref Range   Sodium 136 135 - 145 mmol/L   Potassium 3.8 3.5 - 5.1 mmol/L   Chloride 96 (L) 98 - 111 mmol/L   CO2 26 22 - 32 mmol/L   Glucose, Bld 142 (H) 70 - 99 mg/dL   BUN 14 6 - 20 mg/dL   Creatinine, Ser 1.06 (H) 0.44 - 1.00 mg/dL   Calcium 9.7 8.9 - 10.3  mg/dL   Total Protein 7.4 6.5 - 8.1 g/dL   Albumin 4.2 3.5 - 5.0 g/dL   AST 25 15 - 41 U/L   ALT 31 0 - 44 U/L   Alkaline Phosphatase 135 (H) 38 - 126 U/L   Total Bilirubin 0.9 0.3 - 1.2 mg/dL   GFR calc non Af Amer 58 (L) >60 mL/min   GFR calc Af Amer >60 >60 mL/min  Anion gap 14 5 - 15    Comment: Performed at [facility], [place] [address], , [place] [address]  CBC     Status: Abnormal   Collection Time: 03/30/19  1:36 PM  Result Value Ref Range   WBC 21.0 (H) 4.0 - 10.5 K/uL   RBC 4.62 3.87 - 5.11 MIL/uL   Hemoglobin 14.4 12.0 - 15.0 g/dL   HCT 43.1 36.0 - 46.0 %   MCV 93.3 80.0 - 100.0 fL   MCH 31.2 26.0 - 34.0 pg   MCHC 33.4 30.0 - 36.0 g/dL   RDW 13.4 11.5 - 15.5 %   Platelets 299 150 - 400 K/uL   nRBC 0.0 0.0 - 0.2 %    Comment: Performed at [facility], [place] [address] [place], [place] [id number]  Respiratory Panel by RT PCR (Flu A&B, Covid) - Nasopharyngeal Swab     Status: None   Collection Time: 03/30/19  5:01 PM   Specimen: Nasopharyngeal Swab  Result Value Ref Range   SARS Coronavirus 2 by RT PCR NEGATIVE NEGATIVE    Comment: (NOTE) SARS-CoV-2 target nucleic acids are NOT DETECTED. The SARS-CoV-2 RNA is generally detectable in upper respiratoy specimens during the acute phase of infection. The lowest concentration of SARS-CoV-2 viral copies this assay can detect is 131 copies/mL. A negative result does not preclude SARS-Cov-2 infection and should not be used as the sole basis for treatment or other patient management decisions. A negative result may occur with  improper specimen collection/handling, submission of specimen other than nasopharyngeal swab, presence of viral mutation(s) within the areas targeted by this assay, and inadequate number of viral copies (<131 copies/mL). A negative result must be combined with clinical observations, patient history, and epidemiological information. The expected result is  Negative. Fact Sheet for Patients:  PinkCheek.be Fact Sheet for Healthcare Providers:  GravelBags.it This test is not yet ap proved or cleared by the [place] FDA and  has been authorized for detection and/or diagnosis of SARS-CoV-2 by FDA under an Emergency Use Authorization (EUA). This EUA will remain  in effect (meaning this test can be used) for the duration of the COVID-19 declaration under Section 564(b)(1) of the Act, 21 U.S.C. section 360bbb-3(b)(1), unless the authorization is terminated or revoked sooner.    Influenza A by PCR NEGATIVE NEGATIVE   Influenza B by PCR NEGATIVE NEGATIVE    Comment: (NOTE) The Xpert Xpress SARS-CoV-2/FLU/RSV assay is intended as an aid in  the diagnosis of influenza from Nasopharyngeal swab specimens and  should not be used as a sole basis for treatment. Nasal washings and  aspirates are unacceptable for Xpert Xpress SARS-CoV-2/FLU/RSV  testing. Fact Sheet for Patients: PinkCheek.be Fact Sheet for Healthcare Providers: GravelBags.it This test is not yet approved or cleared by the [place] FDA and  has been authorized for detection and/or diagnosis of SARS-CoV-2 by  FDA under an Emergency Use Authorization (EUA). This EUA will remain  in effect (meaning this test can be used) for the duration of the  Covid-19 declaration under Section 564(b)(1) of the Act, 21  U.S.C. section 360bbb-3(b)(1), unless the authorization is  terminated or revoked. Performed at [facility], [place] [address] [place], [place] [address]    CT ABDOMEN PELVIS W CONTRAST  Result Date: 03/30/2019 CLINICAL DATA:  Acute right lower quadrant pain nausea EXAM: CT ABDOMEN AND PELVIS WITH CONTRAST TECHNIQUE: Multidetector CT imaging of the abdomen and pelvis was performed using the standard  protocol following bolus administration of  intravenous contrast. CONTRAST:  186mL OMNIPAQUE IOHEXOL 300 MG/ML  SOLN COMPARISON:  None. FINDINGS: Lower chest: Minor basilar scarring/atelectasis worse on left. Normal heart size. No pericardial pleural effusion. Hepatobiliary: No focal liver abnormality is seen. No gallstones, gallbladder wall thickening, or biliary dilatation. Pancreas: Unremarkable. No pancreatic ductal dilatation or surrounding inflammatory changes. Spleen: Scattered splenic granulomata noted. No other splenic abnormality or surrounding free fluid. Injury or hematoma. Adrenals/Urinary Tract: Adrenal glands are unremarkable. Kidneys are normal, without renal calculi, focal lesion, or hydronephrosis. Bladder is unremarkable. Stomach/Bowel: Negative for bowel obstruction significant dilatation, ileus, or free air. In the right lower quadrant, there is abnormal appendix with mild distension, mucosal enhancement, and surrounding inflammation/edema compatible with acute nonruptured appendicitis Appendix: Location: Right lower quadrant Diameter: 9 mm Appendicolith: Suspect subcentimeter tiny scattered hyperdense appendicoliths Mucosal hyper-enhancement: Present Extraluminal gas: Absent Periappendiceal collection: Absent Vascular/Lymphatic: Calcific atherosclerosis of the abdominal aorta. No occlusive process or aneurysm. Mesenteric and renal vasculature appear patent. No veno-occlusive process. No bulky adenopathy. Reproductive: Uterus and bilateral adnexa are unremarkable. Other: No abdominal wall hernia or abnormality. No abdominopelvic ascites. Musculoskeletal: Lower lumbar fusion hardware and degenerative changes noted. Bilateral hip arthroplasties. No acute osseous finding. IMPRESSION: Acute nonruptured appendicitis as detailed above. Aortic Atherosclerosis (ICD10-I70.0). Electronically Signed   By: Jerilynn Mages.  Shick M.D.   On: 03/30/2019 16:08    Review of Systems  Constitutional: Positive for fatigue.  HENT: Negative.   Respiratory: Positive  for shortness of breath. Negative for choking and wheezing.   Cardiovascular: Negative for chest pain and leg swelling.  Gastrointestinal: Positive for abdominal pain. Negative for anal bleeding and blood in stool.  Genitourinary: Negative.   Musculoskeletal: Positive for arthralgias, back pain and joint swelling.  Skin: Negative.   Neurological: Negative.   Hematological: Negative.   Psychiatric/Behavioral: Negative.     Blood pressure (!) 161/110, pulse 86, temperature 99.3 F (37.4 C), temperature source Oral, resp. rate 18, height 5\' 6"  (1.676 m), weight 90.7 kg, SpO2 97 %. Physical Exam  Constitutional: She is oriented to person, place, and time. Vital signs are normal. She appears ill.  HENT:  Head: Normocephalic and atraumatic.  Eyes: Pupils are equal, round, and reactive to light.  Cardiovascular: Normal rate and regular rhythm.  Respiratory: Effort normal. No respiratory distress. She has no wheezes.  GI: Soft. There is abdominal tenderness in the right lower quadrant. There is rebound and guarding. There is no rigidity. No hernia.    Musculoskeletal:        General: Normal range of motion.     Cervical back: Normal range of motion and neck supple.  Neurological: She is alert and oriented to person, place, and time.  Skin: Skin is warm and dry.     Assessment/Plan Acute appendicitis with questionable perforation upon my review.  Severe medical comorbidities.  Appreciate medical assistance in managing these.  Severe COPD  Discussed treatment options.  A laparoscopic approach her appendix would be difficult and there is concerned she is already perforated.  Given her severe COPD and medical problems, I recommend a medical approach to managing her for now.  She has no signs of hypotension or hemodynamic instability.  Her examination shows appropriate tenderness in the right lower quadrant at this point time but her abdomen is quite soft and not rigid.  I discussed the pros  and cons of medical and surgical management but feel her medical comorbidities put her at high risk of severe complication, death, prolonged  ventilation, need for tracheostomy, long-term care and significant decline in overall quality of life.  I feel a medical approach for her is more appropriate to start with.  If she improves, we can follow her.  She does not have an appendicolith that I can see upon review which raises her likelihood of success with nonoperative management significantly.  If she does not improve the next 24 to 48 hours, she may require surgery with her previous surgery large piece of mesh risk of complication is extremely high with a very high morbidity and mortality which explained to her.  Admission for IV fluids, allow clear liquids for tonight, continue IV antibiotics, pain management, antiemetic, and continued home oxygen which has been followed by the medical service.  Patient Active Problem List   Diagnosis Date Noted  . Acute appendicitis 03/30/2019  . COPD (chronic obstructive pulmonary disease) ([place]) 03/30/2019  . Obese 01/27/2019  . S/P left TH revision 01/26/2019  . Neck pain 12/18/2018  . Chest pain of uncertain etiology [id number]  . Hypertension   . Medication management 11/06/2018  . Acute on chronic respiratory failure with hypoxia ([place]) 05/21/2018  . Cough 03/25/2018  . Tobacco abuse 01/15/2018  . Hypomagnesemia 12/29/2017  . COPD exacerbation ([place]) 12/28/2017  . Insomnia 12/11/2017  . Pulmonary nodule 11/11/2017  . Carotid artery disease ([place]) 11/05/2017  . Hoarseness 09/09/2017  . OSA on CPAP 08/07/2017  . Family history of heart disease 07/18/2017  . GERD without esophagitis 06/23/2017  . H/O total hip arthroplasty, left 06/18/2017  . Hypothyroidism due to acquired atrophy of thyroid 04/05/2017  . Primary osteoarthritis involving multiple joints 04/05/2017  . Incarcerated ventral hernia 04/04/2017  . Chronic constipation 02/10/2017  . Iron  deficiency anemia due to dietary causes 02/10/2017  . Osteoarthritis 02/10/2017  . Dyslipidemia 02/10/2017  . H/O total hip arthroplasty, right 02/05/2017  . Rash 01/08/2017  . Pre-operative clearance 12/16/2016  . Chronic respiratory failure with hypoxia ([place]) 12/16/2016  . Essential hypertension 11/29/2016  . Back pain 11/29/2016  . Vocal cord dysfunction   . COPD, severe ([place]) 09/24/2016  . Anxiety 09/24/2016  . Steroid-induced diabetes ([place]) 09/24/2016    Turner Daniels, MD 03/30/2019, 6:56 PM

## 2019-03-30 NOTE — ED Notes (Signed)
Pt transported to CT ?

## 2019-03-30 NOTE — ED Notes (Signed)
Pt reminded of need for UA. Pt states she is still unable to void.

## 2019-03-30 NOTE — ED Triage Notes (Signed)
Per EMS- patient is from home. Patient c/o RLQ abdominal pain and nausea since last night.

## 2019-03-30 NOTE — ED Provider Notes (Addendum)
Patient is entire work-up was conducted by Dr. Maryan Rued.  At time of shift end there is a pending consult with general surgery.  For this reason I assumed care of patient temporarily.  Discussed case with Dr. Brantley Stage of general surgery who will admit patient to their service.  Discussed case with Dr. Tana Coast at the request of Dr. Brantley Stage.  Dr. Tana Coast will consult for patient's chronic medical conditions.  Patient will be admitted to general surgery service.  Patient has been admitted at this time. 5:45 PM    Tedd Sias, PA 03/30/19 1745    Ezequiel Essex, MD 03/30/19 2001

## 2019-03-30 NOTE — Consult Note (Addendum)
Medical Consultation   Joan [name]  [id number]  DOB: 1960/03/31  DOA: 03/30/2019  PCP: [name], MD  Requesting physician: ED, gen surgery   Reason for consultation: Medical management   History of Present Illness: Patient is a 59 year old female with history of COPD, on home O2 2 L at night, 2 to 3 L on exertion only during the day if needed, hypothyroidism, anxiety/bipolar disorder, diet controlled diabetes mellitus, hypertension, hyperlipidemia presented with right lower quadrant abdominal pain.  Patient reported that pain has been intermittent for the last month or so however since yesterday, pain has become constant, severe, 10/ 10.  Reported nausea, dry heaving but no vomiting.  Denies any fevers or chills, coughing or any new shortness of breath.  No dysuria.  Review of Systems:Positives marked in 'bold'  Constitutional: Denies fever, chills, diaphoresis, appetite change and fatigue.  HEENT: Denies photophobia, eye pain, redness, hearing loss, ear pain, congestion, sore throat, rhinorrhea, sneezing, mouth sores, trouble swallowing, neck pain, neck stiffness and tinnitus.   Respiratory: Has chronic COPD on 2 L at night, may need 2 to 3 L with exertion during the day as needed.  No acute shortness of breath or wheezing. Cardiovascular: Denies chest pain, palpitations and leg swelling.  Gastrointestinal: Please see HPI Genitourinary: Denies dysuria, urgency, frequency, hematuria, flank pain and difficulty urinating.  Musculoskeletal: Denies myalgias, back pain, joint swelling, arthralgias and gait problem.  Skin: Denies pallor, rash and wound.  Neurological: Denies dizziness, seizures, syncope, weakness, light-headedness, numbness and headaches.  Hematological: Denies adenopathy. Easy bruising, personal or family bleeding history  Psychiatric/Behavioral: Denies suicidal ideation, mood changes, confusion, nervousness, sleep disturbance and agitation   Allergies:    Allergies  Allergen Reactions  . Keflex [Cephalexin] Other (See Comments)    unspecified  . Prednisone Other (See Comments)    "counter reacts"  . Shellfish Allergy Other (See Comments)    Stomach cramps  . Tetracyclines & Related Other (See Comments)    unspecified  . Dilaudid [Hydromorphone Hcl] Other (See Comments)    "Lethargy"  . Incruse Ellipta [Umeclidinium Bromide] Nausea Only  . Tuberculin Tests Other (See Comments)    False postive      Past Medical History:  Diagnosis Date  . Acute respiratory failure with hypoxia ([place])   . Alcoholism and drug addiction in family   . Anxiety   . Arthritis    "knees, hips, lower back" (09/25/2016)  . Asthma   . Bipolar disorder ([place])   . Chronic bronchitis ([place])    "all the time" (09/25/2016)  . Chronic leg pain    RLE  . Chronic lower back pain   . COPD (chronic obstructive pulmonary disease) ([place])   . Depression   . Diabetes mellitus without complication ([place])   . Emphysema of lung ([place])   . GERD (gastroesophageal reflux disease)   . Headache   . Hepatitis C    "treated back in 2001-2002"  . High cholesterol   . Hypertension   . Hypothyroidism   . Incarcerated ventral hernia 04/04/2017  . On home oxygen therapy    "2L; at nighttime" (04/04/2017)  . OSA on CPAP    CPAP with Oxygen 2 liters  . Pneumonia    "all the time" (09/25/2016)  . Positive PPD    with negative chest x ray  . Tachycardia    patient reports with exertion ; denies any other conditions   . Thyroid disease     Past Surgical History:  Procedure Laterality Date  . BACK SURGERY    . BIOPSY  11/24/2018   Procedure: BIOPSY;  Surgeon: Thornton Park, MD;  Location: [facility];  Service: Gastroenterology;;  . CARDIAC CATHETERIZATION    . COLONOSCOPY    . COLONOSCOPY WITH PROPOFOL N/A 11/24/2018   Procedure: COLONOSCOPY WITH PROPOFOL;  Surgeon: Thornton Park, MD;  Location: [facility];  Service: Gastroenterology;  Laterality: N/A;  . CYST EXCISION      removal of cyst in sinuses  . DILATION AND CURETTAGE OF UTERUS    . ESOPHAGOGASTRODUODENOSCOPY (EGD) WITH PROPOFOL N/A 11/24/2018   Procedure: ESOPHAGOGASTRODUODENOSCOPY (EGD) WITH PROPOFOL;  Surgeon: Thornton Park, MD;  Location: [facility];  Service: Gastroenterology;  Laterality: N/A;  . FOOT SURGERY Bilateral    bone removed from 4th and 5 th toes and put in pin then took pin out  . HERNIA REPAIR    . INCISION AND DRAINAGE  ~ 2014/2015   "removed mesh & infection"  . INCISIONAL HERNIA REPAIR N/A 04/04/2017   Procedure: LAPAROSCOPIC INCISIONAL HERNIA REPAIR WITH MESH;  Surgeon: Fanny Skates, MD;  Location: [facility];  Service: General;  Laterality: N/A;  . LACERATION REPAIR Right    repair wrist artery from laceration from ice skate  . LAPAROSCOPIC INCISIONAL / UMBILICAL / VENTRAL HERNIA REPAIR  04/04/2017   LAPAROSCOPIC INCISIONAL HERNIA REPAIR WITH MESH  . LIPOMA EXCISION Right    fattty lipoma in neck  . NASAL SEPTUM SURGERY    . POSTERIOR LUMBAR FUSION  2015/2016 X 2   "added rods and screws"  . TOTAL HIP ARTHROPLASTY Right 02/05/2017   Procedure: RIGHT TOTAL HIP ARTHROPLASTY;  Surgeon: [name], MD;  Location: [facility];  Service: Orthopedics;  Laterality: Right;  . TOTAL HIP ARTHROPLASTY Left 06/18/2017   Procedure: LEFT TOTAL HIP ARTHROPLASTY;  Surgeon: [name], MD;  Location: [facility];  Service: Orthopedics;  Laterality: Left;  . TOTAL HIP REVISION Left 01/26/2019   Procedure: TOTAL HIP REVISION;  Surgeon: Paralee Cancel, MD;  Location: [facility];  Service: Orthopedics;  Laterality: Left;  2 hrs  . TUBAL LIGATION    . UMBILICAL HERNIA REPAIR  ~ 2013   w/mesh  . UPPER GI ENDOSCOPY      Social History:  reports that she quit smoking about 3 months ago. Her smoking use included cigarettes. She has a 44.00 pack-year smoking history. She has never used smokeless tobacco. She reports previous alcohol use. She reports previous drug use. Drug: "Crack" cocaine.  Family  History  Problem Relation Age of Onset  . Diabetes Mother   . Hypertension Mother   . Hypertension Father   . Cancer Father        lung       Physical Exam: Blood pressure (!) 161/110, pulse 86, temperature 99.3 F (37.4 C), temperature source Oral, resp. rate 18, height 5\' 6"  (1.676 m), weight 90.7 kg, SpO2 97 %.   Constitutional: Alert and awake, oriented x3, not in any acute distress. Eyes: PERLA, EOMI, irises appear normal, anicteric sclera,  ENMT: external ears and nose appear normal            Lips appears normal, oropharynx mucosa, tongue, posterior pharynx appear normal  Neck: neck appears normal, no masses, normal ROM, no thyromegaly, no JVD  CVS: S1-S2 clear, no murmur rubs or gallops, no LE edema, normal pedal pulses  Respiratory:  clear to auscultation bilaterally, no wheezing, rales or rhonchi. Respiratory effort normal. No accessory muscle use.  Abdomen: soft, RLQ  TTP with guarding and rebound tenderness, nondistended, normal bowel sounds Musculoskeletal: no cyanosis, clubbing or edema noted bilaterally  Neuro: Cranial nerves II-XII intact, strength, sensation, reflexes Psych: judgement and insight appear normal, stable mood and affect Skin: no rashes or lesions or ulcers, no induration or nodules    Labs Data:  Basic Metabolic Panel: Recent Labs  Lab 03/30/19 1336  NA 136  K 3.8  CL 96*  CO2 26  GLUCOSE 142*  BUN 14  CREATININE 1.06*  CALCIUM 9.7   Liver Function Tests: Recent Labs  Lab 03/30/19 1336  AST 25  ALT 31  ALKPHOS 135*  BILITOT 0.9  PROT 7.4  ALBUMIN 4.2   Recent Labs  Lab 03/30/19 1336  LIPASE 21   No results for input(s): AMMONIA in the last 168 hours. CBC: Recent Labs  Lab 03/30/19 1336  WBC 21.0*  HGB 14.4  HCT 43.1  MCV 93.3  PLT 299   Cardiac Enzymes: No results for input(s): CKTOTAL, CKMB, CKMBINDEX, TROPONINI in the last 168 hours. BNP: Invalid input(s): POCBNP CBG: No results for input(s): GLUCAP in the  last 168 hours.  Inpatient Medications:   Scheduled Meds: . levothyroxine  12.5 mcg Intravenous Daily  . metoprolol tartrate  2.5 mg Intravenous Q6H  . mometasone-formoterol  2 puff Inhalation BID  . pantoprazole (PROTONIX) IV  40 mg Intravenous Q24H  . sodium chloride flush  3 mL Intravenous Once   Continuous Infusions: . ciprofloxacin 400 mg (03/30/19 1703)   And  . metronidazole 500 mg (03/30/19 1702)     Radiological Exams on Admission: CT ABDOMEN PELVIS W CONTRAST  Result Date: 03/30/2019 CLINICAL DATA:  Acute right lower quadrant pain nausea EXAM: CT ABDOMEN AND PELVIS WITH CONTRAST TECHNIQUE: Multidetector CT imaging of the abdomen and pelvis was performed using the standard protocol following bolus administration of intravenous contrast. CONTRAST:  154mL OMNIPAQUE IOHEXOL 300 MG/ML  SOLN COMPARISON:  None. FINDINGS: Lower chest: Minor basilar scarring/atelectasis worse on left. Normal heart size. No pericardial pleural effusion. Hepatobiliary: No focal liver abnormality is seen. No gallstones, gallbladder wall thickening, or biliary dilatation. Pancreas: Unremarkable. No pancreatic ductal dilatation or surrounding inflammatory changes. Spleen: Scattered splenic granulomata noted. No other splenic abnormality or surrounding free fluid. Injury or hematoma. Adrenals/Urinary Tract: Adrenal glands are unremarkable. Kidneys are normal, without renal calculi, focal lesion, or hydronephrosis. Bladder is unremarkable. Stomach/Bowel: Negative for bowel obstruction significant dilatation, ileus, or free air. In the right lower quadrant, there is abnormal appendix with mild distension, mucosal enhancement, and surrounding inflammation/edema compatible with acute nonruptured appendicitis Appendix: Location: Right lower quadrant Diameter: 9 mm Appendicolith: Suspect subcentimeter tiny scattered hyperdense appendicoliths Mucosal hyper-enhancement: Present Extraluminal gas: Absent Periappendiceal  collection: Absent Vascular/Lymphatic: Calcific atherosclerosis of the abdominal aorta. No occlusive process or aneurysm. Mesenteric and renal vasculature appear patent. No veno-occlusive process. No bulky adenopathy. Reproductive: Uterus and bilateral adnexa are unremarkable. Other: No abdominal wall hernia or abnormality. No abdominopelvic ascites. Musculoskeletal: Lower lumbar fusion hardware and degenerative changes noted. Bilateral hip arthroplasties. No acute osseous finding. IMPRESSION: Acute nonruptured appendicitis as detailed above. Aortic Atherosclerosis (ICD10-I70.0). Electronically Signed   By: Jerilynn Mages.  Shick M.D.   On: 03/30/2019 16:08    Impression/Recommendations Principal Problem: Abdominal pain secondary to acute nonruptured appendicitis -CT abdomen showed acute nonruptured appendicitis, - management per general surgery, primary service -Currently on IV antibiotics, n.p.o., placed on IV fluids  Active Problems:    Essential hypertension -BP slightly elevated likely due to pain, takes atenolol 25 mg  daily and HCTZ 25 mg daily at home -Currently n.p.o., placed on IV Lopressor 2.5 mg IV every 6 hours to avoid reflex tachycardia  History of COPD, chronic respiratory failure with hypoxia ([facility]) -Currently no acute wheezing, continue O2 2 L, stable at baseline.  Has quit smoking. -Incentive spirometry after surgery -Restarted Combivent inhaler, Dulera -Resume Daliresp once taking p.o.  Diabetes mellitus: Diet controlled -Patient reports that she does not check her blood sugars at home and is not on any oral hypoglycemics -Monitor CBGs, placed on sliding scale insulin if elevated -will check hemoglobin A1c, last HbA1c 6.7 in 11/2018    Dyslipidemia -Hold statin while n.p.o., resume Lipitor 20 mg daily once taking p.o. after surgery    Hypothyroidism  -Change to IV Synthroid 12.5 MCG daily while awaiting NPO    GERD without esophagitis -Placed on IV PPI (takes Protonix 40 mg  daily at home)    Anxiety/bipolar disorder -Currently holding Seroquel nightly while n.p.o.  [facility] medicine service will follow the patient, thank you for this consultation.  Time Spent  60 minutes    M.D. [facility] 03/30/2019, 5:53 PM

## 2019-03-30 NOTE — Progress Notes (Signed)
Patient is tearfully sobbing in pain. MD paged in regards to pain as well as an order for CPAP and abx.

## 2019-03-30 NOTE — ED Provider Notes (Signed)
[facility] DEPT Provider Note   CSN: [id number] Arrival date & time: 03/30/19  1246     History Chief Complaint  Patient presents with  . Abdominal Pain  . Nausea    Joan [name] is a 59 y.o. female.  Patient is a 59 year old female with a history of COPD on home oxygen, hypothyroidism, hepatitis, bipolar disease, diabetes who is presenting today with right-sided abdominal pain.  Patient states that this pain has been intermittent for about a month but for the last 2 days the pain has become severe.  It is worse with any kind of movement.  Anorexia for the last 2 days she has not tried eating anything.  Intermittent nausea but no vomiting.  Last normal bowel movement was yesterday.  She has not noticed a fever and denies new cough or new shortness of breath.  No urinary symptoms of dysuria, frequency or urgency at this time.  Prior abdominal surgery with a hernia repair and mesh placement but no other significant abdominal surgeries.  Pain is a 10 out of 10 with moving but otherwise a 7 or 8 out of 10 when still.  The history is provided by the patient.  Abdominal Pain      Past Medical History:  Diagnosis Date  . Acute respiratory failure with hypoxia ([place])   . Alcoholism and drug addiction in family   . Anxiety   . Arthritis    "knees, hips, lower back" (09/25/2016)  . Asthma   . Bipolar disorder ([place])   . Chronic bronchitis ([place])    "all the time" (09/25/2016)  . Chronic leg pain    RLE  . Chronic lower back pain   . COPD (chronic obstructive pulmonary disease) ([place])   . Depression   . Diabetes mellitus without complication ([place])   . Emphysema of lung ([place])   . GERD (gastroesophageal reflux disease)   . Headache   . Hepatitis C    "treated back in 2001-2002"  . High cholesterol   . Hypertension   . Hypothyroidism   . Incarcerated ventral hernia 04/04/2017  . On home oxygen therapy    "2L; at nighttime" (04/04/2017)  . OSA on CPAP    CPAP  with Oxygen 2 liters  . Pneumonia    "all the time" (09/25/2016)  . Positive PPD    with negative chest x ray  . Tachycardia    patient reports with exertion ; denies any other conditions   . Thyroid disease     Patient Active Problem List   Diagnosis Date Noted  . Obese 01/27/2019  . S/P left TH revision 01/26/2019  . Neck pain 12/18/2018  . Chest pain of uncertain etiology [id number]  . Hypertension   . Medication management 11/06/2018  . Acute on chronic respiratory failure with hypoxia ([place]) 05/21/2018  . Cough 03/25/2018  . Tobacco abuse 01/15/2018  . Hypomagnesemia 12/29/2017  . COPD exacerbation ([place]) 12/28/2017  . Insomnia 12/11/2017  . Pulmonary nodule 11/11/2017  . Carotid artery disease ([place]) 11/05/2017  . Hoarseness 09/09/2017  . OSA on CPAP 08/07/2017  . Family history of heart disease 07/18/2017  . GERD without esophagitis 06/23/2017  . H/O total hip arthroplasty, left 06/18/2017  . Hypothyroidism due to acquired atrophy of thyroid 04/05/2017  . Primary osteoarthritis involving multiple joints 04/05/2017  . Incarcerated ventral hernia 04/04/2017  . Chronic constipation 02/10/2017  . Iron deficiency anemia due to dietary causes 02/10/2017  . Osteoarthritis 02/10/2017  . Dyslipidemia  02/10/2017  . H/O total hip arthroplasty, right 02/05/2017  . Rash 01/08/2017  . Pre-operative clearance 12/16/2016  . Chronic respiratory failure with hypoxia ([place]) 12/16/2016  . Essential hypertension 11/29/2016  . Back pain 11/29/2016  . Vocal cord dysfunction   . COPD, severe ([place]) 09/24/2016  . Anxiety 09/24/2016  . Steroid-induced diabetes ([place]) 09/24/2016    Past Surgical History:  Procedure Laterality Date  . BACK SURGERY    . BIOPSY  11/24/2018   Procedure: BIOPSY;  Surgeon: Thornton Park, MD;  Location: [facility];  Service: Gastroenterology;;  . CARDIAC CATHETERIZATION    . COLONOSCOPY    . COLONOSCOPY WITH PROPOFOL N/A 11/24/2018   Procedure: COLONOSCOPY  WITH PROPOFOL;  Surgeon: Thornton Park, MD;  Location: [facility];  Service: Gastroenterology;  Laterality: N/A;  . CYST EXCISION     removal of cyst in sinuses  . DILATION AND CURETTAGE OF UTERUS    . ESOPHAGOGASTRODUODENOSCOPY (EGD) WITH PROPOFOL N/A 11/24/2018   Procedure: ESOPHAGOGASTRODUODENOSCOPY (EGD) WITH PROPOFOL;  Surgeon: Thornton Park, MD;  Location: [facility];  Service: Gastroenterology;  Laterality: N/A;  . FOOT SURGERY Bilateral    bone removed from 4th and 5 th toes and put in pin then took pin out  . HERNIA REPAIR    . INCISION AND DRAINAGE  ~ 2014/2015   "removed mesh & infection"  . INCISIONAL HERNIA REPAIR N/A 04/04/2017   Procedure: LAPAROSCOPIC INCISIONAL HERNIA REPAIR WITH MESH;  Surgeon: Fanny Skates, MD;  Location: [facility];  Service: General;  Laterality: N/A;  . LACERATION REPAIR Right    repair wrist artery from laceration from ice skate  . LAPAROSCOPIC INCISIONAL / UMBILICAL / VENTRAL HERNIA REPAIR  04/04/2017   LAPAROSCOPIC INCISIONAL HERNIA REPAIR WITH MESH  . LIPOMA EXCISION Right    fattty lipoma in neck  . NASAL SEPTUM SURGERY    . POSTERIOR LUMBAR FUSION  2015/2016 X 2   "added rods and screws"  . TOTAL HIP ARTHROPLASTY Right 02/05/2017   Procedure: RIGHT TOTAL HIP ARTHROPLASTY;  Surgeon: [name], MD;  Location: [facility];  Service: Orthopedics;  Laterality: Right;  . TOTAL HIP ARTHROPLASTY Left 06/18/2017   Procedure: LEFT TOTAL HIP ARTHROPLASTY;  Surgeon: [name], MD;  Location: [facility];  Service: Orthopedics;  Laterality: Left;  . TOTAL HIP REVISION Left 01/26/2019   Procedure: TOTAL HIP REVISION;  Surgeon: Paralee Cancel, MD;  Location: [facility];  Service: Orthopedics;  Laterality: Left;  2 hrs  . TUBAL LIGATION    . UMBILICAL HERNIA REPAIR  ~ 2013   w/mesh  . UPPER GI ENDOSCOPY       OB History   No obstetric history on file.     Family History  Problem Relation Age of Onset  . Diabetes Mother   . Hypertension Mother     . Hypertension Father   . Cancer Father        lung    Social History   Tobacco Use  . Smoking status: Former Smoker    Packs/day: 1.00    Years: 44.00    Pack years: 44.00    Types: Cigarettes    Quit date: 11/30/2018    Years since quitting: 0.3  . Smokeless tobacco: Never Used  . Tobacco comment: Smoker 1-1.5 PPD every day, 11-20-2018 "i got myself down to half a pack", i am gonna be going on chantix on the 28th"  Substance Use Topics  . Alcohol use: Not Currently  . Drug use: Not Currently  Types: "Crack" cocaine    Comment: 04/04/2017 "nothing since 01/12/2012"    Home Medications Prior to Admission medications   Medication Sig Start Date End Date Taking? Authorizing Provider  acyclovir (ZOVIRAX) 400 MG tablet Take 1 tablet (400 mg total) by mouth 2 (two) times daily. Patient taking differently: Take 400 mg by mouth daily.  06/01/18   [name], MD  albuterol (PROVENTIL) (2.5 MG/3ML) 0.083% nebulizer solution Take 3 mLs (2.5 mg total) by nebulization 2 (two) times daily. And every 6 hours as needed.  DX: J44.9 Patient taking differently: Take 2.5 mg by nebulization 2 (two) times daily as needed. And every 6 hours as needed.  DX: J44.9 11/27/18   [name], NP  atenolol (TENORMIN) 25 MG tablet TAKE 1 TABLET BY MOUTH  DAILY 03/12/19   [name], MD  atorvastatin (LIPITOR) 20 MG tablet TAKE 1 TABLET BY MOUTH  DAILY 03/12/19   [name], MD  budesonide-formoterol [facility]) 160-4.5 MCG/ACT inhaler Inhale 2 puffs into the lungs 2 (two) times daily. 06/24/18   [name], NP  buPROPion (WELLBUTRIN XL) 150 MG 24 hr tablet Take 1 tablet (150 mg total) by mouth 2 (two) times daily. Patient taking differently: Take 150 mg by mouth daily.  11/13/18   [name], NP  cyclobenzaprine (FLEXERIL) 10 MG tablet Take 1 tablet (10 mg total) by mouth at bedtime. 07/10/18   [name], MD  diphenhydrAMINE (BENADRYL) 25 MG tablet Take 25 mg by  mouth 2 (two) times daily.     [provider]  docusate sodium (COLACE) 100 MG capsule Take 1 capsule (100 mg total) by mouth 2 (two) times daily. 01/27/19   Danae Orleans, PA-C  ferrous sulfate (FERROUSUL) 325 (65 FE) MG tablet Take 1 tablet (325 mg total) by mouth 3 (three) times daily with meals for 14 days. 01/27/19 02/10/19  Danae Orleans, PA-C  gabapentin (NEURONTIN) 300 MG capsule TAKE 1 CAPSULE BY MOUTH  TWICE DAILY 03/12/19   [name], MD  hydrochlorothiazide (HYDRODIURIL) 25 MG tablet TAKE 1 TABLET BY MOUTH  DAILY 03/12/19   [name], MD  HYDROcodone-acetaminophen (NORCO) 7.5-325 MG tablet Take 1-2 tablets by mouth every 4 (four) hours as needed for moderate pain. 01/27/19   Danae Orleans, PA-C  HYDROcodone-acetaminophen (NORCO/VICODIN) 5-325 MG tablet Take 1 tablet by mouth every 6 (six) hours as needed for moderate pain. 02/13/19   Lawyer, Harrell Gave, PA-C  Ipratropium-Albuterol (COMBIVENT RESPIMAT) 20-100 MCG/ACT AERS respimat Inhale 1 puff into the lungs every 6 (six) hours as needed for wheezing. Patient taking differently: Inhale 2 puffs into the lungs every 6 (six) hours as needed for wheezing.  04/17/18   [name], MD  levothyroxine (SYNTHROID) 25 MCG tablet TAKE 1 TABLET BY MOUTH  DAILY BEFORE BREAKFAST 03/12/19   [name], MD  [facility] ARTHRITIS PAIN RELIEF EX) Apply 1 patch topically daily as needed (pain).    [provider]  Melatonin 10 MG TABS Take 10 mg by mouth at bedtime.    [provider]  montelukast (SINGULAIR) 10 MG tablet TAKE 1 TABLET BY MOUTH AT  BEDTIME 03/19/19   [name], MD  Multiple Vitamins-Minerals (WOMENS 50+ MULTI VITAMIN/MIN PO) Take 1 tablet by mouth daily.    [provider]  nicotine (NICODERM CQ) 14 mg/24hr patch Place 1 patch (14 mg total) onto the skin daily. 12/11/18   [name], NP  nystatin-triamcinolone ointment [facility]) Apply  1 application  topically 2 (two) times daily as needed (applied to skin folds/groins/under breast skin irritation rash.). 09/08/17   [name], MD  OXYGEN Inhale 2 L into the lungs at bedtime. May use 2.5 - 3 L during the day with strenuous activity Plugged it to CPAP    [provider]  pantoprazole (PROTONIX) 40 MG tablet TAKE 1 TABLET BY MOUTH  DAILY 03/12/19   [name], MD  polyethylene glycol (MIRALAX / GLYCOLAX) 17 g packet Take 17 g by mouth 2 (two) times daily. 01/27/19   Danae Orleans, PA-C  QUEtiapine (SEROQUEL) 25 MG tablet Take 2 tablets (50 mg total) by mouth daily. 03/29/19   [name], MD  QUEtiapine (SEROQUEL) 50 MG tablet TAKE 1 TABLET BY MOUTH EVERYDAY AT BEDTIME 03/22/19   [name], MD  rivaroxaban (XARELTO) 10 MG TABS tablet Take 1 tablet (10 mg total) by mouth daily. 01/27/19   Danae Orleans, PA-C  roflumilast (DALIRESP) 500 MCG TABS tablet Take 1 tablet (500 mcg total) by mouth daily. 11/06/18   [name], NP  traZODone (DESYREL) 100 MG tablet TAKE 1 TABLET BY MOUTH AT  BEDTIME Patient taking differently: Take 100 mg by mouth at bedtime.  01/11/19   [name], MD  varenicline (CHANTIX STARTING MONTH PAK) 0.5 MG X 11 & 1 MG X 42 tablet Take one 0.5 mg tablet by mouth once daily for 3 days, then increase to one 0.5 mg tablet twice daily for 4 days, then increase to one 1 mg tablet twice daily. Patient taking differently: Take 0.5 mg by mouth 2 (two) times daily.  11/18/18   [name], NP  venlafaxine XR (EFFEXOR-XR) 150 MG 24 hr capsule TAKE 1 CAPSULE BY MOUTH  DAILY WITH BREAKFAST Patient taking differently: Take 150 mg by mouth daily with breakfast. TAKE 1 CAPSULE BY MOUTH  DAILY WITH BREAKFAST 01/11/19   [name], MD    Allergies    Keflex [cephalexin], Prednisone, Shellfish allergy, Tetracyclines & related, Dilaudid [hydromorphone hcl], Incruse ellipta [umeclidinium bromide], and Tuberculin tests  Review of Systems    Review of Systems  Gastrointestinal: Positive for abdominal pain.  All other systems reviewed and are negative.   Physical Exam Updated Vital Signs BP (!) 145/87 (BP Location: Right Arm)   Pulse 89   Temp 99.3 F (37.4 C) (Oral)   Resp 20   Ht 5\' 6"  (1.676 m)   Wt 90.7 kg   SpO2 94%   BMI 32.28 kg/m   Physical Exam Vitals and nursing note reviewed.  Constitutional:      General: She is in acute distress.     Appearance: She is well-developed. She is obese.  HENT:     Head: Normocephalic and atraumatic.  Eyes:     Pupils: Pupils are equal, round, and reactive to light.  Cardiovascular:     Rate and Rhythm: Normal rate and regular rhythm.     Heart sounds: Normal heart sounds. No murmur. No friction rub.  Pulmonary:     Effort: Pulmonary effort is normal.     Breath sounds: Normal breath sounds. No wheezing or rales.  Abdominal:     General: Bowel sounds are normal. There is no distension.     Palpations: Abdomen is soft.     Tenderness: There is abdominal tenderness in the right lower quadrant. There is guarding and rebound. There is no right CVA tenderness.  Musculoskeletal:  General: No tenderness. Normal range of motion.     Comments: No edema  Skin:    General: Skin is warm and dry.     Findings: No rash.  Neurological:     Mental Status: She is alert and oriented to person, place, and time.     Cranial Nerves: No cranial nerve deficit.  Psychiatric:        Behavior: Behavior normal.     ED Results / Procedures / Treatments   Labs (all labs ordered are listed, but only abnormal results are displayed) Labs Reviewed  COMPREHENSIVE METABOLIC PANEL - Abnormal; Notable for the following components:      Result Value   Chloride 96 (*)    Glucose, Bld 142 (*)    Creatinine, Ser 1.06 (*)    Alkaline Phosphatase 135 (*)    GFR calc non Af Amer 58 (*)    All other components within normal limits  CBC - Abnormal; Notable for the following components:    WBC 21.0 (*)    All other components within normal limits  RESPIRATORY PANEL BY RT PCR (FLU A&B, COVID)  LIPASE, BLOOD  URINALYSIS, ROUTINE W REFLEX MICROSCOPIC    EKG None  Radiology CT ABDOMEN PELVIS W CONTRAST  Result Date: 03/30/2019 CLINICAL DATA:  Acute right lower quadrant pain nausea EXAM: CT ABDOMEN AND PELVIS WITH CONTRAST TECHNIQUE: Multidetector CT imaging of the abdomen and pelvis was performed using the standard protocol following bolus administration of intravenous contrast. CONTRAST:  112mL OMNIPAQUE IOHEXOL 300 MG/ML  SOLN COMPARISON:  None. FINDINGS: Lower chest: Minor basilar scarring/atelectasis worse on left. Normal heart size. No pericardial pleural effusion. Hepatobiliary: No focal liver abnormality is seen. No gallstones, gallbladder wall thickening, or biliary dilatation. Pancreas: Unremarkable. No pancreatic ductal dilatation or surrounding inflammatory changes. Spleen: Scattered splenic granulomata noted. No other splenic abnormality or surrounding free fluid. Injury or hematoma. Adrenals/Urinary Tract: Adrenal glands are unremarkable. Kidneys are normal, without renal calculi, focal lesion, or hydronephrosis. Bladder is unremarkable. Stomach/Bowel: Negative for bowel obstruction significant dilatation, ileus, or free air. In the right lower quadrant, there is abnormal appendix with mild distension, mucosal enhancement, and surrounding inflammation/edema compatible with acute nonruptured appendicitis Appendix: Location: Right lower quadrant Diameter: 9 mm Appendicolith: Suspect subcentimeter tiny scattered hyperdense appendicoliths Mucosal hyper-enhancement: Present Extraluminal gas: Absent Periappendiceal collection: Absent Vascular/Lymphatic: Calcific atherosclerosis of the abdominal aorta. No occlusive process or aneurysm. Mesenteric and renal vasculature appear patent. No veno-occlusive process. No bulky adenopathy. Reproductive: Uterus and bilateral adnexa are  unremarkable. Other: No abdominal wall hernia or abnormality. No abdominopelvic ascites. Musculoskeletal: Lower lumbar fusion hardware and degenerative changes noted. Bilateral hip arthroplasties. No acute osseous finding. IMPRESSION: Acute nonruptured appendicitis as detailed above. Aortic Atherosclerosis (ICD10-I70.0). Electronically Signed   By: Jerilynn Mages.  Shick M.D.   On: 03/30/2019 16:08    Procedures Procedures (including critical care time)  Medications Ordered in ED Medications  sodium chloride flush (NS) 0.9 % injection 3 mL (3 mLs Intravenous Not Given 03/30/19 1408)  iohexol (OMNIPAQUE) 300 MG/ML solution 100 mL (has no administration in time range)  morphine 4 MG/ML injection 4 mg (4 mg Intravenous Given 03/30/19 1505)  ondansetron (ZOFRAN) injection 4 mg (4 mg Intravenous Given 03/30/19 1505)    ED Course  I have reviewed the triage vital signs and the nursing notes.  Pertinent labs & imaging results that were available during my care of the patient were reviewed by me and considered in my medical decision making (see chart for  details).    MDM Rules/Calculators/A&P                      59 year old female with multiple medical problems presenting today with abdominal pain that has worsened in the last few days.  Patient has significant right lower quadrant pain with tenderness and guarding.  Patient's labs are significant for a leukocytosis of 21,000 but otherwise normal creatinine, lipase and anion gap.  UA is pending.  Concern for appendicitis versus atypical cholecystitis versus hepatitis versus kidney stone versus pyelonephritis.  Lower suspicion for bowel obstruction at this time.  Could be a complication with her known mesh.  No external findings concerning for zoster. Patient given pain control and fluids.  CT and UA are pending.  4:21 PM CT findings are consistent with acute unruptured appendicitis.  Patient given IV antibiotics with Cipro and metronidazole due to a Keflex allergy.   2-hour Covid is pending.  General surgery consult.  Final Clinical Impression(s) / ED Diagnoses Final diagnoses:  Acute appendicitis with localized peritonitis, without perforation, abscess, or gangrene    Rx / DC Orders ED Discharge Orders    None       Blanchie Dessert, MD 03/30/19 1621

## 2019-03-30 NOTE — ED Notes (Signed)
Pt set up with bedside commode, tissue, and call light. Pt states she does not have to urinate now, but will hit call light when she does. Pt also handed tablet and glasses from bag.

## 2019-03-30 NOTE — Progress Notes (Signed)
Patient complaining of "chest pain" located in the armpit. States she can not give it a number due to her stomach pain. Verbalizes it is not radiating anywhere else and it has been hurting since she presented to the ED and that she told someone about this in the ED. 12 lead EKG preformed. Results read NSR.

## 2019-03-31 ENCOUNTER — Other Ambulatory Visit: Payer: Self-pay | Admitting: *Deleted

## 2019-03-31 ENCOUNTER — Inpatient Hospital Stay (HOSPITAL_COMMUNITY): Payer: Medicare HMO | Admitting: Certified Registered"

## 2019-03-31 ENCOUNTER — Encounter (HOSPITAL_COMMUNITY): Payer: Self-pay

## 2019-03-31 ENCOUNTER — Encounter (HOSPITAL_COMMUNITY): Admission: EM | Disposition: A | Discharge status: Home Health | Payer: Self-pay | Source: Home / Self Care

## 2019-03-31 DIAGNOSIS — Z4682 Encounter for fitting and adjustment of non-vascular catheter: Secondary | ICD-10-CM | POA: Diagnosis not present

## 2019-03-31 DIAGNOSIS — F4312 Post-traumatic stress disorder, chronic: Secondary | ICD-10-CM | POA: Diagnosis not present

## 2019-03-31 DIAGNOSIS — K353 Acute appendicitis with localized peritonitis, without perforation or gangrene: Secondary | ICD-10-CM | POA: Diagnosis not present

## 2019-03-31 DIAGNOSIS — Z9981 Dependence on supplemental oxygen: Secondary | ICD-10-CM | POA: Diagnosis not present

## 2019-03-31 DIAGNOSIS — R443 Hallucinations, unspecified: Secondary | ICD-10-CM | POA: Diagnosis not present

## 2019-03-31 DIAGNOSIS — K3532 Acute appendicitis with perforation and localized peritonitis, without abscess: Secondary | ICD-10-CM | POA: Diagnosis not present

## 2019-03-31 DIAGNOSIS — K56609 Unspecified intestinal obstruction, unspecified as to partial versus complete obstruction: Secondary | ICD-10-CM | POA: Diagnosis not present

## 2019-03-31 DIAGNOSIS — Z833 Family history of diabetes mellitus: Secondary | ICD-10-CM | POA: Diagnosis not present

## 2019-03-31 DIAGNOSIS — G8929 Other chronic pain: Secondary | ICD-10-CM | POA: Diagnosis not present

## 2019-03-31 DIAGNOSIS — M545 Low back pain: Secondary | ICD-10-CM | POA: Diagnosis not present

## 2019-03-31 DIAGNOSIS — K219 Gastro-esophageal reflux disease without esophagitis: Secondary | ICD-10-CM | POA: Diagnosis not present

## 2019-03-31 DIAGNOSIS — J449 Chronic obstructive pulmonary disease, unspecified: Secondary | ICD-10-CM | POA: Diagnosis not present

## 2019-03-31 DIAGNOSIS — M1990 Unspecified osteoarthritis, unspecified site: Secondary | ICD-10-CM | POA: Diagnosis not present

## 2019-03-31 DIAGNOSIS — F411 Generalized anxiety disorder: Secondary | ICD-10-CM | POA: Diagnosis not present

## 2019-03-31 DIAGNOSIS — R69 Illness, unspecified: Secondary | ICD-10-CM | POA: Diagnosis not present

## 2019-03-31 DIAGNOSIS — K567 Ileus, unspecified: Secondary | ICD-10-CM | POA: Diagnosis not present

## 2019-03-31 DIAGNOSIS — F0630 Mood disorder due to known physiological condition, unspecified: Secondary | ICD-10-CM | POA: Diagnosis not present

## 2019-03-31 DIAGNOSIS — Z20822 Contact with and (suspected) exposure to covid-19: Secondary | ICD-10-CM | POA: Diagnosis not present

## 2019-03-31 DIAGNOSIS — J9611 Chronic respiratory failure with hypoxia: Secondary | ICD-10-CM | POA: Diagnosis not present

## 2019-03-31 HISTORY — PX: LAPAROSCOPIC APPENDECTOMY: SHX408

## 2019-03-31 LAB — COMPREHENSIVE METABOLIC PANEL
ALT: 28 U/L (ref 0–44)
AST: 20 U/L (ref 15–41)
Albumin: 4.3 g/dL (ref 3.5–5.0)
Alkaline Phosphatase: 125 U/L (ref 38–126)
Anion gap: 16 — ABNORMAL HIGH (ref 5–15)
BUN: 17 mg/dL (ref 6–20)
CO2: 24 mmol/L (ref 22–32)
Calcium: 9.1 mg/dL (ref 8.9–10.3)
Chloride: 96 mmol/L — ABNORMAL LOW (ref 98–111)
Creatinine, Ser: 1.23 mg/dL — ABNORMAL HIGH (ref 0.44–1.00)
GFR calc Af Amer: 56 mL/min — ABNORMAL LOW (ref >60)
GFR calc non Af Amer: 49 mL/min — ABNORMAL LOW (ref >60)
Glucose, Bld: 120 mg/dL — ABNORMAL HIGH (ref 70–99)
Potassium: 3.9 mmol/L (ref 3.5–5.1)
Sodium: 136 mmol/L (ref 135–145)
Total Bilirubin: 1.2 mg/dL (ref 0.3–1.2)
Total Protein: 7.6 g/dL (ref 6.5–8.1)

## 2019-03-31 LAB — GLUCOSE, CAPILLARY
Glucose-Capillary: 103 mg/dL — ABNORMAL HIGH (ref 70–99)
Glucose-Capillary: 117 mg/dL — ABNORMAL HIGH (ref 70–99)
Glucose-Capillary: 120 mg/dL — ABNORMAL HIGH (ref 70–99)
Glucose-Capillary: 131 mg/dL — ABNORMAL HIGH (ref 70–99)
Glucose-Capillary: 131 mg/dL — ABNORMAL HIGH (ref 70–99)
Glucose-Capillary: 144 mg/dL — ABNORMAL HIGH (ref 70–99)
Glucose-Capillary: 144 mg/dL — ABNORMAL HIGH (ref 70–99)
Glucose-Capillary: 168 mg/dL — ABNORMAL HIGH (ref 70–99)

## 2019-03-31 LAB — HIV ANTIBODY (ROUTINE TESTING W REFLEX): HIV Screen 4th Generation wRfx: NONREACTIVE

## 2019-03-31 LAB — HEMOGLOBIN A1C
Hgb A1c MFr Bld: 6.2 % — ABNORMAL HIGH (ref 4.8–5.6)
Mean Plasma Glucose: 131.24 mg/dL

## 2019-03-31 LAB — CBC
HCT: 41.7 % (ref 36.0–46.0)
Hemoglobin: 13.1 g/dL (ref 12.0–15.0)
MCH: 30.6 pg (ref 26.0–34.0)
MCHC: 31.4 g/dL (ref 30.0–36.0)
MCV: 97.4 fL (ref 80.0–100.0)
Platelets: 240 10*3/uL (ref 150–400)
RBC: 4.28 MIL/uL (ref 3.87–5.11)
RDW: 13.8 % (ref 11.5–15.5)
WBC: 18.4 10*3/uL — ABNORMAL HIGH (ref 4.0–10.5)
nRBC: 0 % (ref 0.0–0.2)

## 2019-03-31 SURGERY — APPENDECTOMY, LAPAROSCOPIC
Anesthesia: General | Site: Abdomen

## 2019-03-31 MED ORDER — KETOROLAC TROMETHAMINE 15 MG/ML IJ SOLN
15.0000 mg | Freq: Four times a day (QID) | INTRAMUSCULAR | Status: AC | PRN
  Administered 2019-04-01 – 2019-04-05 (×6): 15 mg via INTRAVENOUS
  Filled 2019-03-31 (×6): qty 1

## 2019-03-31 MED ORDER — ROCURONIUM BROMIDE 10 MG/ML (PF) SYRINGE
PREFILLED_SYRINGE | INTRAVENOUS | Status: DC | PRN
  Administered 2019-03-31: 40 mg via INTRAVENOUS
  Administered 2019-03-31: 10 mg via INTRAVENOUS

## 2019-03-31 MED ORDER — FENTANYL CITRATE (PF) 100 MCG/2ML IJ SOLN
INTRAMUSCULAR | Status: AC
  Filled 2019-03-31: qty 2

## 2019-03-31 MED ORDER — BUPIVACAINE HCL 0.25 % IJ SOLN
INTRAMUSCULAR | Status: AC
  Filled 2019-03-31: qty 1

## 2019-03-31 MED ORDER — FENTANYL CITRATE (PF) 100 MCG/2ML IJ SOLN
25.0000 ug | INTRAMUSCULAR | Status: DC | PRN
  Administered 2019-03-31: 25 ug via INTRAVENOUS

## 2019-03-31 MED ORDER — MIDAZOLAM HCL 2 MG/2ML IJ SOLN: 0.5000 mg | Freq: Once | INTRAMUSCULAR | Status: DC | PRN

## 2019-03-31 MED ORDER — FENTANYL CITRATE (PF) 100 MCG/2ML IJ SOLN
INTRAMUSCULAR | Status: DC | PRN
  Administered 2019-03-31 (×3): 50 ug via INTRAVENOUS

## 2019-03-31 MED ORDER — ALBUTEROL SULFATE HFA 108 (90 BASE) MCG/ACT IN AERS
INHALATION_SPRAY | RESPIRATORY_TRACT | Status: DC | PRN
  Administered 2019-03-31: 8 via RESPIRATORY_TRACT

## 2019-03-31 MED ORDER — PROPOFOL 10 MG/ML IV BOLUS
INTRAVENOUS | Status: AC
  Filled 2019-03-31: qty 20

## 2019-03-31 MED ORDER — ONDANSETRON HCL 4 MG/2ML IJ SOLN
INTRAMUSCULAR | Status: DC | PRN
  Administered 2019-03-31: 4 mg via INTRAVENOUS

## 2019-03-31 MED ORDER — ALBUTEROL SULFATE HFA 108 (90 BASE) MCG/ACT IN AERS
INHALATION_SPRAY | RESPIRATORY_TRACT | Status: AC
  Filled 2019-03-31: qty 6.7

## 2019-03-31 MED ORDER — ONDANSETRON HCL 4 MG/2ML IJ SOLN
INTRAMUSCULAR | Status: AC
  Filled 2019-03-31: qty 2

## 2019-03-31 MED ORDER — LIDOCAINE 2% (20 MG/ML) 5 ML SYRINGE
INTRAMUSCULAR | Status: DC | PRN
  Administered 2019-03-31: 50 mg via INTRAVENOUS

## 2019-03-31 MED ORDER — DIPHENHYDRAMINE HCL 25 MG PO CAPS
25.0000 mg | ORAL_CAPSULE | Freq: Four times a day (QID) | ORAL | Status: DC | PRN
  Administered 2019-04-01 – 2019-04-10 (×3): 25 mg via ORAL
  Filled 2019-03-31 (×3): qty 1

## 2019-03-31 MED ORDER — LACTATED RINGERS IV SOLN
INTRAVENOUS | Status: AC | PRN
  Administered 2019-03-31: 1000 mL

## 2019-03-31 MED ORDER — ROCURONIUM BROMIDE 10 MG/ML (PF) SYRINGE
PREFILLED_SYRINGE | INTRAVENOUS | Status: AC
  Filled 2019-03-31: qty 10

## 2019-03-31 MED ORDER — PANTOPRAZOLE SODIUM 40 MG PO TBEC
40.0000 mg | DELAYED_RELEASE_TABLET | Freq: Every day | ORAL | Status: DC
  Administered 2019-03-31 – 2019-04-01 (×2): 40 mg via ORAL
  Filled 2019-03-31 (×2): qty 1

## 2019-03-31 MED ORDER — BUPIVACAINE HCL (PF) 0.25 % IJ SOLN
INTRAMUSCULAR | Status: DC | PRN
  Administered 2019-03-31: 7 mL

## 2019-03-31 MED ORDER — 0.9 % SODIUM CHLORIDE (POUR BTL) OPTIME
TOPICAL | Status: DC | PRN
  Administered 2019-03-31: 1000 mL

## 2019-03-31 MED ORDER — MEPERIDINE HCL 50 MG/ML IJ SOLN: 6.2500 mg | INTRAMUSCULAR | Status: DC | PRN

## 2019-03-31 MED ORDER — MIDAZOLAM HCL 2 MG/2ML IJ SOLN
INTRAMUSCULAR | Status: DC | PRN
  Administered 2019-03-31: 2 mg via INTRAVENOUS

## 2019-03-31 MED ORDER — MORPHINE SULFATE (PF) 2 MG/ML IV SOLN
1.0000 mg | INTRAVENOUS | Status: DC | PRN
  Administered 2019-03-31 (×2): 3 mg via INTRAVENOUS
  Administered 2019-04-01: 1 mg via INTRAVENOUS
  Administered 2019-04-02 – 2019-04-06 (×4): 2 mg via INTRAVENOUS
  Administered 2019-04-08: 1 mg via INTRAVENOUS
  Administered 2019-04-08 – 2019-04-13 (×10): 2 mg via INTRAVENOUS
  Filled 2019-03-31 (×15): qty 1
  Filled 2019-03-31 (×2): qty 2
  Filled 2019-03-31 (×2): qty 1

## 2019-03-31 MED ORDER — SCOPOLAMINE 1 MG/3DAYS TD PT72
MEDICATED_PATCH | TRANSDERMAL | Status: AC
  Filled 2019-03-31: qty 1

## 2019-03-31 MED ORDER — SUCCINYLCHOLINE CHLORIDE 200 MG/10ML IV SOSY
PREFILLED_SYRINGE | INTRAVENOUS | Status: AC
  Filled 2019-03-31: qty 10

## 2019-03-31 MED ORDER — PROPOFOL 10 MG/ML IV BOLUS
INTRAVENOUS | Status: DC | PRN
  Administered 2019-03-31: 100 mg via INTRAVENOUS

## 2019-03-31 MED ORDER — FENTANYL CITRATE (PF) 100 MCG/2ML IJ SOLN
INTRAMUSCULAR | Status: AC
  Administered 2019-03-31: 25 ug via INTRAVENOUS
  Filled 2019-03-31: qty 2

## 2019-03-31 MED ORDER — SUCCINYLCHOLINE CHLORIDE 200 MG/10ML IV SOSY
PREFILLED_SYRINGE | INTRAVENOUS | Status: DC | PRN
  Administered 2019-03-31: 110 mg via INTRAVENOUS

## 2019-03-31 MED ORDER — SUGAMMADEX SODIUM 200 MG/2ML IV SOLN
INTRAVENOUS | Status: DC | PRN
  Administered 2019-03-31: 200 mg via INTRAVENOUS

## 2019-03-31 MED ORDER — BUPIVACAINE-EPINEPHRINE 0.25% -1:200000 IJ SOLN: INTRAMUSCULAR | Status: DC | PRN

## 2019-03-31 MED ORDER — MIDAZOLAM HCL 2 MG/2ML IJ SOLN
INTRAMUSCULAR | Status: AC
  Filled 2019-03-31: qty 2

## 2019-03-31 MED ORDER — ACETAMINOPHEN 500 MG PO TABS
1000.0000 mg | ORAL_TABLET | Freq: Three times a day (TID) | ORAL | Status: DC
  Administered 2019-03-31 – 2019-04-14 (×32): 1000 mg via ORAL
  Filled 2019-03-31 (×35): qty 2

## 2019-03-31 MED ORDER — ACETAMINOPHEN 325 MG PO TABS
650.0000 mg | ORAL_TABLET | Freq: Four times a day (QID) | ORAL | Status: DC
  Administered 2019-03-31: 650 mg via ORAL
  Filled 2019-03-31: qty 2

## 2019-03-31 MED ORDER — OXYCODONE HCL 5 MG PO TABS
5.0000 mg | ORAL_TABLET | ORAL | Status: DC | PRN
  Administered 2019-03-31 – 2019-04-01 (×4): 10 mg via ORAL
  Administered 2019-04-02 – 2019-04-07 (×6): 5 mg via ORAL
  Administered 2019-04-07: 10 mg via ORAL
  Administered 2019-04-08: 5 mg via ORAL
  Administered 2019-04-08 – 2019-04-14 (×20): 10 mg via ORAL
  Filled 2019-03-31 (×10): qty 2
  Filled 2019-03-31 (×2): qty 1
  Filled 2019-03-31 (×6): qty 2
  Filled 2019-03-31: qty 1
  Filled 2019-03-31 (×4): qty 2
  Filled 2019-03-31: qty 1
  Filled 2019-03-31 (×3): qty 2
  Filled 2019-03-31: qty 1
  Filled 2019-03-31: qty 2
  Filled 2019-03-31: qty 1
  Filled 2019-03-31: qty 2
  Filled 2019-03-31: qty 1

## 2019-03-31 MED ORDER — SCOPOLAMINE 1 MG/3DAYS TD PT72
1.0000 | MEDICATED_PATCH | Freq: Once | TRANSDERMAL | Status: DC
  Administered 2019-03-31: 1.5 mg via TRANSDERMAL

## 2019-03-31 MED ORDER — LIDOCAINE 2% (20 MG/ML) 5 ML SYRINGE
INTRAMUSCULAR | Status: AC
  Filled 2019-03-31: qty 5

## 2019-03-31 MED ORDER — LACTATED RINGERS IV SOLN: INTRAVENOUS | Status: DC

## 2019-03-31 MED ORDER — PROMETHAZINE HCL 25 MG/ML IJ SOLN: 6.2500 mg | INTRAMUSCULAR | Status: DC | PRN

## 2019-03-31 SURGICAL SUPPLY — 38 items
APPLIER CLIP 5 13 M/L LIGAMAX5 (MISCELLANEOUS)
APPLIER CLIP ROT 10 11.4 M/L (STAPLE)
CHLORAPREP W/TINT 26 (MISCELLANEOUS) ×2 IMPLANT
CLIP APPLIE 5 13 M/L LIGAMAX5 (MISCELLANEOUS) IMPLANT
CLIP APPLIE ROT 10 11.4 M/L (STAPLE) IMPLANT
CLIP VESOLOCK XL 6/CT (CLIP) ×2 IMPLANT
COVER TRANSDUCER ULTRASND (DRAPES) ×2 IMPLANT
COVER WAND RF STERILE (DRAPES) IMPLANT
DECANTER SPIKE VIAL GLASS SM (MISCELLANEOUS) ×2 IMPLANT
DERMABOND ADVANCED (GAUZE/BANDAGES/DRESSINGS) ×1
DERMABOND ADVANCED .7 DNX12 (GAUZE/BANDAGES/DRESSINGS) ×1 IMPLANT
DEVICE TROCAR PUNCTURE CLOSURE (ENDOMECHANICALS) ×2 IMPLANT
DISSECTOR BLUNT TIP ENDO 5MM (MISCELLANEOUS) ×2 IMPLANT
DRAIN CHANNEL 19F RND (DRAIN) ×2 IMPLANT
ELECT REM PT RETURN 15FT ADLT (MISCELLANEOUS) ×2 IMPLANT
ENDOLOOP SUT PDS II  0 18 (SUTURE) ×1
ENDOLOOP SUT PDS II 0 18 (SUTURE) ×1 IMPLANT
EVACUATOR SILICONE 100CC (DRAIN) ×2 IMPLANT
GLOVE BIO SURGEON STRL SZ7.5 (GLOVE) ×2 IMPLANT
GOWN STRL REUS W/TWL XL LVL3 (GOWN DISPOSABLE) ×4 IMPLANT
KIT BASIN OR (CUSTOM PROCEDURE TRAY) ×2 IMPLANT
KIT TURNOVER KIT A (KITS) IMPLANT
NEEDLE INSUFFLATION 14GA 120MM (NEEDLE) ×2 IMPLANT
PENCIL SMOKE EVACUATOR (MISCELLANEOUS) ×2 IMPLANT
POUCH RETRIEVAL ECOSAC 10 (ENDOMECHANICALS) ×1 IMPLANT
POUCH RETRIEVAL ECOSAC 10MM (ENDOMECHANICALS) ×1
SCISSORS LAP 5X35 DISP (ENDOMECHANICALS) ×2 IMPLANT
SET IRRIG TUBING LAPAROSCOPIC (IRRIGATION / IRRIGATOR) ×2 IMPLANT
SET TUBE SMOKE EVAC HIGH FLOW (TUBING) ×2 IMPLANT
SLEEVE XCEL OPT CAN 5 100 (ENDOMECHANICALS) ×2 IMPLANT
SUT ETHILON 2 0 PS N (SUTURE) ×2 IMPLANT
SUT MNCRL AB 4-0 PS2 18 (SUTURE) ×2 IMPLANT
TOWEL OR 17X26 10 PK STRL BLUE (TOWEL DISPOSABLE) ×2 IMPLANT
TOWEL OR NON WOVEN STRL DISP B (DISPOSABLE) ×2 IMPLANT
TRAY FOLEY MTR SLVR 16FR STAT (SET/KITS/TRAYS/PACK) IMPLANT
TRAY LAPAROSCOPIC (CUSTOM PROCEDURE TRAY) ×2 IMPLANT
TROCAR BLADELESS OPT 5 100 (ENDOMECHANICALS) ×4 IMPLANT
TROCAR XCEL NON-BLD 11X100MML (ENDOMECHANICALS) ×2 IMPLANT

## 2019-03-31 NOTE — Progress Notes (Signed)
PROGRESS NOTE    Joan [name]  [id number] DOB: 1960-06-09 DOA: 03/30/2019 PCP: [name], MD   Brief Narrative:  Patient is a 59 year old female with history of COPD, on home O2 2 L at night, 2 to 3 L on exertion only during the day if needed, hypothyroidism, anxiety/bipolar disorder, diet controlled diabetes mellitus, hypertension, hyperlipidemia presented with right lower quadrant abdominal pain.  Patient reported that pain has been intermittent for the last month or so however since yesterday, pain has become constant, severe, 10/ 10.  Reported nausea, dry heaving but no vomiting.  Denies any fevers or chills, coughing or any new shortness of breath.  No dysuria.  Patient is a s/p laparoscopic appendicectomy.   Assessment & Plan:   Principal Problem:   Acute appendicitis Active Problems:   Anxiety   Essential hypertension   Chronic respiratory failure with hypoxia (HCC)   Dyslipidemia   Hypothyroidism due to acquired atrophy of thyroid   GERD without esophagitis   Hypertension   COPD (chronic obstructive pulmonary disease) (HCC)  # Abdominal pain secondary to acute nonruptured appendicitis -CT abdomen showed acute nonruptured appendicitis, - management per general surgery, primary service -Currently on IV antibiotics, n.p.o., placed on IV fluids -S/p laparoscopic appendicectomy, postop day 1 -Continue postoperative orders    # Essential hypertension -BP slightly elevated likely due to pain, takes atenolol 25 mg daily and HCTZ 25 mg daily at home. - continue IV Lopressor 2.5 mg IV every 6 hours to avoid reflex tachycardia.  # History of COPD, chronic respiratory failure with hypoxia (HCC) -Currently no acute wheezing, continue O2 2 L, stable at baseline.  Has quit smoking. -Incentive spirometry after surgery -Restarted Combivent inhaler, Dulera -Resume Daliresp once taking p.o.  # Diabetes mellitus: Diet controlled -Patient reports that she does not  check her blood sugars at home and is not on any oral hypoglycemics -Monitor CBGs, placed on sliding scale insulin if elevated -will check hemoglobin A1c, last HbA1c 6.7 in 11/2018  # Dyslipidemia -Hold statin while n.p.o., resume Lipitor 20 mg daily once taking p.o. after surgery  # Hypothyroidism  -Change to IV Synthroid 12.5 MCG daily while awaiting NPO   # GERD without esophagitis -Placed on IV PPI (takes Protonix 40 mg daily at home)   # Anxiety/bipolar disorder -Currently holding Seroquel nightly while n.p.o.  [facility] medicine service will follow the patient, thank you for this consultation.  DVT prophylaxis:  Code Status:  Full Family Communication: Discussed with patient in detail. Disposition Plan: s/p Lap Appendectomy , anticipated discharge home in 1 to 2 days   Consultants:   None.  Procedures:  Lap. Appendectomy   Antimicrobials:  Anti-infectives (From admission, onward)   Start     Dose/Rate Route Frequency Ordered Stop   03/31/19 0500  ciprofloxacin (CIPRO) IVPB 400 mg     400 mg 200 mL/hr over 60 Minutes Intravenous Every 12 hours 03/30/19 2242     03/31/19 0200  metroNIDAZOLE (FLAGYL) IVPB 500 mg     500 mg 100 mL/hr over 60 Minutes Intravenous Every 8 hours 03/30/19 2242     03/30/19 1630  ciprofloxacin (CIPRO) IVPB 400 mg     400 mg 200 mL/hr over 60 Minutes Intravenous  Once 03/30/19 1620 03/30/19 1914   03/30/19 1630  metroNIDAZOLE (FLAGYL) IVPB 500 mg     500 mg 100 mL/hr over 60 Minutes Intravenous  Once 03/30/19 1620 03/30/19 1914     Subjective: Patient was seen at bedside.  She complains of having persistent pain, states she is going to the operating room for appendicectomy.  Objective: Vitals:   03/31/19 1345 03/31/19 1400 03/31/19 1415 03/31/19 1436  BP: 139/73 110/70 121/70 (!) 150/87  Pulse: (!) 107 (!) 102 99 100  Resp: 15 18 18 18   Temp:   98.9 F (37.2 C) 98.5 F (36.9 C)  TempSrc:    Oral  SpO2: 92% 91% 92% 94%  Weight:       Height:        Intake/Output Summary (Last 24 hours) at 03/31/2019 1552 Last data filed at 03/31/2019 1415 Gross per 24 hour  Intake 1761.25 ml  Output 350 ml  Net 1411.25 ml   Filed Weights   03/30/19 1259 03/31/19 1135  Weight: 90.7 kg 90.7 kg    Examination:  General exam: Appears calm and comfortable  Respiratory system: Clear to auscultation. Respiratory effort normal. Cardiovascular system: S1 & S2 heard, RRR. No JVD, murmurs, rubs, gallops or clicks. No pedal edema. Gastrointestinal system: Abdomen is nondistended, soft , RLQ tenderness ++.  Central nervous system: Alert and oriented. No focal neurological deficits. Extremities:  No swelling, No leg pain. Skin: No rashes, lesions or ulcers Psychiatry: Judgement and insight appear normal. Mood & affect appropriate.     Data Reviewed: I have personally reviewed following labs and imaging studies  CBC: Recent Labs  Lab 03/30/19 1336 03/31/19 0417  WBC 21.0* 18.4*  HGB 14.4 13.1  HCT 43.1 41.7  MCV 93.3 97.4  PLT 299 [id number]   Basic Metabolic Panel: Recent Labs  Lab 03/30/19 1336 03/31/19 0417  NA 136 136  K 3.8 3.9  CL 96* 96*  CO2 26 24  GLUCOSE 142* 120*  BUN 14 17  CREATININE 1.06* 1.23*  CALCIUM 9.7 9.1   GFR: Estimated Creatinine Clearance: 57.3 mL/min (A) (by C-G formula based on SCr of 1.23 mg/dL (H)). Liver Function Tests: Recent Labs  Lab 03/30/19 1336 03/31/19 0417  AST 25 20  ALT 31 28  ALKPHOS 135* 125  BILITOT 0.9 1.2  PROT 7.4 7.6  ALBUMIN 4.2 4.3   Recent Labs  Lab 03/30/19 1336  LIPASE 21   No results for input(s): AMMONIA in the last 168 hours. Coagulation Profile: No results for input(s): INR, PROTIME in the last 168 hours. Cardiac Enzymes: No results for input(s): CKTOTAL, CKMB, CKMBINDEX, TROPONINI in the last 168 hours. BNP (last 3 results) No results for input(s): PROBNP in the last 8760 hours. HbA1C: Recent Labs    03/31/19 0417  HGBA1C 6.2*   CBG: Recent  Labs  Lab 03/31/19 0003 03/31/19 0501 03/31/19 0743 03/31/19 1127 03/31/19 1344  GLUCAP 144* 117* 120* 144* 168*   Lipid Profile: No results for input(s): CHOL, HDL, LDLCALC, TRIG, CHOLHDL, LDLDIRECT in the last 72 hours. Thyroid Function Tests: Recent Labs    03/30/19 1915  TSH 1.156   Anemia Panel: No results for input(s): VITAMINB12, FOLATE, FERRITIN, TIBC, IRON, RETICCTPCT in the last 72 hours. Sepsis Labs: No results for input(s): PROCALCITON, LATICACIDVEN in the last 168 hours.  Recent Results (from the past 240 hour(s))  Respiratory Panel by RT PCR (Flu A&B, Covid) - Nasopharyngeal Swab     Status: None   Collection Time: 03/30/19  5:01 PM   Specimen: Nasopharyngeal Swab  Result Value Ref Range Status   SARS Coronavirus 2 by RT PCR NEGATIVE NEGATIVE Final    Comment: (NOTE) SARS-CoV-2 target nucleic acids are NOT DETECTED. The SARS-CoV-2 RNA is generally detectable  in upper respiratoy specimens during the acute phase of infection. The lowest concentration of SARS-CoV-2 viral copies this assay can detect is 131 copies/mL. A negative result does not preclude SARS-Cov-2 infection and should not be used as the sole basis for treatment or other patient management decisions. A negative result may occur with  improper specimen collection/handling, submission of specimen other than nasopharyngeal swab, presence of viral mutation(s) within the areas targeted by this assay, and inadequate number of viral copies (<131 copies/mL). A negative result must be combined with clinical observations, patient history, and epidemiological information. The expected result is Negative. Fact Sheet for Patients:  PinkCheek.be Fact Sheet for Healthcare Providers:  GravelBags.it This test is not yet ap proved or cleared by the [place] FDA and  has been authorized for detection and/or diagnosis of SARS-CoV-2 by FDA under an  Emergency Use Authorization (EUA). This EUA will remain  in effect (meaning this test can be used) for the duration of the COVID-19 declaration under Section 564(b)(1) of the Act, 21 U.S.C. section 360bbb-3(b)(1), unless the authorization is terminated or revoked sooner.    Influenza A by PCR NEGATIVE NEGATIVE Final   Influenza B by PCR NEGATIVE NEGATIVE Final    Comment: (NOTE) The Xpert Xpress SARS-CoV-2/FLU/RSV assay is intended as an aid in  the diagnosis of influenza from Nasopharyngeal swab specimens and  should not be used as a sole basis for treatment. Nasal washings and  aspirates are unacceptable for Xpert Xpress SARS-CoV-2/FLU/RSV  testing. Fact Sheet for Patients: PinkCheek.be Fact Sheet for Healthcare Providers: GravelBags.it This test is not yet approved or cleared by the [place] FDA and  has been authorized for detection and/or diagnosis of SARS-CoV-2 by  FDA under an Emergency Use Authorization (EUA). This EUA will remain  in effect (meaning this test can be used) for the duration of the  Covid-19 declaration under Section 564(b)(1) of the Act, 21  U.S.C. section 360bbb-3(b)(1), unless the authorization is  terminated or revoked. Performed at [facility], [place] [address], [place], [place] [address]      Radiology Studies: CT ABDOMEN PELVIS W CONTRAST  Result Date: 03/30/2019 CLINICAL DATA:  Acute right lower quadrant pain nausea EXAM: CT ABDOMEN AND PELVIS WITH CONTRAST TECHNIQUE: Multidetector CT imaging of the abdomen and pelvis was performed using the standard protocol following bolus administration of intravenous contrast. CONTRAST:  186mL OMNIPAQUE IOHEXOL 300 MG/ML  SOLN COMPARISON:  None. FINDINGS: Lower chest: Minor basilar scarring/atelectasis worse on left. Normal heart size. No pericardial pleural effusion. Hepatobiliary: No focal liver abnormality is seen. No gallstones,  gallbladder wall thickening, or biliary dilatation. Pancreas: Unremarkable. No pancreatic ductal dilatation or surrounding inflammatory changes. Spleen: Scattered splenic granulomata noted. No other splenic abnormality or surrounding free fluid. Injury or hematoma. Adrenals/Urinary Tract: Adrenal glands are unremarkable. Kidneys are normal, without renal calculi, focal lesion, or hydronephrosis. Bladder is unremarkable. Stomach/Bowel: Negative for bowel obstruction significant dilatation, ileus, or free air. In the right lower quadrant, there is abnormal appendix with mild distension, mucosal enhancement, and surrounding inflammation/edema compatible with acute nonruptured appendicitis Appendix: Location: Right lower quadrant Diameter: 9 mm Appendicolith: Suspect subcentimeter tiny scattered hyperdense appendicoliths Mucosal hyper-enhancement: Present Extraluminal gas: Absent Periappendiceal collection: Absent Vascular/Lymphatic: Calcific atherosclerosis of the abdominal aorta. No occlusive process or aneurysm. Mesenteric and renal vasculature appear patent. No veno-occlusive process. No bulky adenopathy. Reproductive: Uterus and bilateral adnexa are unremarkable. Other: No abdominal wall hernia or abnormality. No abdominopelvic ascites. Musculoskeletal: Lower lumbar fusion hardware and  degenerative changes noted. Bilateral hip arthroplasties. No acute osseous finding. IMPRESSION: Acute nonruptured appendicitis as detailed above. Aortic Atherosclerosis (ICD10-I70.0). Electronically Signed   By: Jerilynn Mages.  Shick M.D.   On: 03/30/2019 16:08     Scheduled Meds: . acetaminophen  1,000 mg Oral Q8H  . insulin aspart  0-9 Units Subcutaneous Q4H  . ipratropium-albuterol  3 mL Nebulization BID  . levothyroxine  12.5 mcg Intravenous Daily  . metoprolol tartrate  2.5 mg Intravenous Q6H  . mometasone-formoterol  2 puff Inhalation BID  . pantoprazole  40 mg Oral Daily  . scopolamine      . sodium chloride flush  3 mL  Intravenous Once   Continuous Infusions: . sodium chloride 75 mL/hr at 03/31/19 1445  . ciprofloxacin 400 mg (03/31/19 0531)  . metronidazole 500 mg (03/31/19 0853)     LOS: 1 day    Time spent: 25 mins.    Shawna Clamp, MD [facility]   If 7PM-7AM, please contact night-coverage

## 2019-03-31 NOTE — Patient Outreach (Signed)
[facility]) Care Management  03/31/2019  DMIA BIERL 12-27-1960 [id number]   RN Health Coach discipline closure. Patient has been admitted into hospital and will be followed by Complex Care Management.  Plan: Discipline closure Letter sent to PCP   [place] Management [phone]

## 2019-03-31 NOTE — Anesthesia Procedure Notes (Signed)
Procedure Name: Intubation Date/Time: 03/31/2019 12:03 PM Performed by: Niel Hummer, CRNA Pre-anesthesia Checklist: Patient identified, Emergency Drugs available, Suction available and Patient being monitored Patient Re-evaluated:Patient Re-evaluated prior to induction Oxygen Delivery Method: Circle system utilized Preoxygenation: Pre-oxygenation with 100% oxygen Induction Type: IV induction and Cricoid Pressure applied Laryngoscope Size: Mac and 4 Grade View: Grade I Tube type: Oral Tube size: 7.0 mm Number of attempts: 1 Airway Equipment and Method: Stylet Placement Confirmation: positive ETCO2,  ETT inserted through vocal cords under direct vision and breath sounds checked- equal and bilateral Secured at: 22 cm Tube secured with: Tape Dental Injury: Teeth and Oropharynx as per pre-operative assessment

## 2019-03-31 NOTE — Progress Notes (Signed)
PT Cancellation Note  Patient Details Name: Joan [name] MRN: [id number] DOB: Aug 06, 1960   Cancelled Treatment:    Reason Eval/Treat Not Completed: Patient at procedure or test/unavailable(pt at procedure for appendicitis. will follow up at later date/time when pt is available and as schedule allows.)   Verner Mould, DPT Physical Therapist with [facility] [phone]  03/31/2019 11:51 AM

## 2019-03-31 NOTE — Patient Outreach (Signed)
[facility]) Care Management  [id number]  Joan [name] February 13, 1961 [id number]   RN Health Coach attempted follow up outreach call to patient.  Patient was unavailable. HIPPA compliance voicemail message left with return callback number.  Plan: RN will call patient again within 30 days.  [place] [facility] Management [phone]

## 2019-03-31 NOTE — Anesthesia Preprocedure Evaluation (Addendum)
Anesthesia Evaluation  Patient identified by MRN, date of birth, ID band Patient awake    Reviewed: Allergy & Precautions, NPO status , Patient's Chart, lab work & pertinent test results, reviewed documented beta blocker date and time   History of Anesthesia Complications Negative for: history of anesthetic complications  Airway Mallampati: II  TM Distance: >3 FB Neck ROM: Full    Dental  (+) Missing, Poor Dentition, Chipped, Dental Advisory Given   Pulmonary sleep apnea, Continuous Positive Airway Pressure Ventilation and Oxygen sleep apnea , COPD,  COPD inhaler and oxygen dependent, former smoker,    breath sounds clear to auscultation       Cardiovascular hypertension, Pt. on medications and Pt. on home beta blockers (-) angina Rhythm:Regular Rate:Normal  '19 Nuclear stress EF: 60%. Mild inferoseptal wall hypokinesis, no ST segment deviation noted during stress. No ischemia identified. The study is normal.  '18 ECHO: EF 55-60%, valves OK   Neuro/Psych  Headaches, Anxiety Depression Bipolar Disorder    GI/Hepatic GERD  Controlled,(+) Hepatitis -Acute appy   Endo/Other  diabetes (glu 120)Hypothyroidism   Renal/GU Renal InsufficiencyRenal disease (creat 1.23)     Musculoskeletal  (+) Arthritis ,   Abdominal (+) + obese,   Peds  Hematology negative hematology ROS (+)   Anesthesia Other Findings   Reproductive/Obstetrics                            Anesthesia Physical Anesthesia Plan  ASA: III  Anesthesia Plan: General   Post-op Pain Management:    Induction: Intravenous and Rapid sequence  PONV Risk Score and Plan: 3 and Ondansetron, Dexamethasone and Scopolamine patch - Pre-op  Airway Management Planned: Oral ETT  Additional Equipment:   Intra-op Plan:   Post-operative Plan: Extubation in OR  Informed Consent: I have reviewed the patients History and Physical, chart, labs and  discussed the procedure including the risks, benefits and alternatives for the proposed anesthesia with the patient or authorized representative who has indicated his/her understanding and acceptance.     Dental advisory given  Plan Discussed with: CRNA and Surgeon  Anesthesia Plan Comments:        Anesthesia Quick Evaluation

## 2019-03-31 NOTE — Progress Notes (Signed)
[facility]/Trauma Progress Note      Assessment/Plan  Severe COPD on home O2 Severe medical comorbidities.  Appreciate medical assistance in managing these.  Acute appendicitis with questionable perforation - pain is worse today per pt. She wants surgery - she is hemodynamically stable - continue medically management at this time - treat medically vs surgically? - will discuss with MD  FEN: NPO, sips with meds VTE: SCD's, lovenox ID: Cipro & Flagyl 02/02>>   WBC 18.4   afebrile Foley: none Follow up: TBD    LOS: 1 day    Subjective: CC: abdominal pain  Pt state pain is worse than yesterday. She wants surgery. She endorses nausea without vomiting. Having flatus. No issues overnight.   Objective: Vital signs in last 24 hours: Temp:  [98.2 F (36.8 C)-99.3 F (37.4 C)] 98.6 F (37 C) (02/03 0401) Pulse Rate:  [84-93] 89 (02/03 0401) Resp:  [17-22] 19 (02/03 0401) BP: (117-161)/(77-110) 117/78 (02/03 0401) SpO2:  [90 %-99 %] 94 % (02/03 0724) Weight:  [90.7 kg] 90.7 kg (02/02 1259) Last BM Date: 03/29/19  Intake/Output from previous day: 02/02 0701 - 02/03 0700 In: 1711.3 [P.O.:100; I.V.:739.7; IV Piggyback:871.5] Out: 300 [Urine:300] Intake/Output this shift: No intake/output data recorded.  PE:  Gen:  Alert, NAD, appears uncomfortable  Card:  RRR, no M/G/R heard Pulm:  CTA, no W/R/R, rate and effort normal, [place] O2 Abd: Soft, ND, +BS, TTP RLQ with guarding, no peritonitis  Skin: no rashes noted, warm and dry   Anti-infectives: Anti-infectives (From admission, onward)   Start     Dose/Rate Route Frequency Ordered Stop   03/31/19 0500  ciprofloxacin (CIPRO) IVPB 400 mg     400 mg 200 mL/hr over 60 Minutes Intravenous Every 12 hours 03/30/19 2242     03/31/19 0200  metroNIDAZOLE (FLAGYL) IVPB 500 mg     500 mg 100 mL/hr over 60 Minutes Intravenous Every 8 hours 03/30/19 2242     03/30/19 1630  ciprofloxacin (CIPRO) IVPB 400 mg     400  mg 200 mL/hr over 60 Minutes Intravenous  Once 03/30/19 1620 03/30/19 1914   03/30/19 1630  metroNIDAZOLE (FLAGYL) IVPB 500 mg     500 mg 100 mL/hr over 60 Minutes Intravenous  Once 03/30/19 1620 03/30/19 1914      Lab Results:  Recent Labs    03/30/19 1336 03/31/19 0417  WBC 21.0* 18.4*  HGB 14.4 13.1  HCT 43.1 41.7  PLT 299 240   BMET Recent Labs    03/30/19 1336 03/31/19 0417  NA 136 136  K 3.8 3.9  CL 96* 96*  CO2 26 24  GLUCOSE 142* 120*  BUN 14 17  CREATININE 1.06* 1.23*  CALCIUM 9.7 9.1   PT/INR No results for input(s): LABPROT, INR in the last 72 hours. CMP     Component Value Date/Time   NA 136 03/31/2019 0417   NA 140 12/09/2018 1133   K 3.9 03/31/2019 0417   CL 96 (L) 03/31/2019 0417   CO2 24 03/31/2019 0417   GLUCOSE 120 (H) 03/31/2019 0417   BUN 17 03/31/2019 0417   BUN 18 12/09/2018 1133   CREATININE 1.23 (H) 03/31/2019 0417   CALCIUM 9.1 03/31/2019 0417   PROT 7.6 03/31/2019 0417   PROT 6.5 08/19/2017 0822   ALBUMIN 4.3 03/31/2019 0417   ALBUMIN 4.5 08/19/2017 0822   AST 20 03/31/2019 0417   ALT 28 03/31/2019 0417   ALKPHOS 125 03/31/2019 0417   BILITOT  1.2 03/31/2019 0417   BILITOT 0.3 08/19/2017 0822   GFRNONAA 49 (L) 03/31/2019 0417   GFRAA 56 (L) 03/31/2019 0417   Lipase     Component Value Date/Time   LIPASE 21 03/30/2019 1336    Studies/Results: CT ABDOMEN PELVIS W CONTRAST  Result Date: 03/30/2019 CLINICAL DATA:  Acute right lower quadrant pain nausea EXAM: CT ABDOMEN AND PELVIS WITH CONTRAST TECHNIQUE: Multidetector CT imaging of the abdomen and pelvis was performed using the standard protocol following bolus administration of intravenous contrast. CONTRAST:  153mL OMNIPAQUE IOHEXOL 300 MG/ML  SOLN COMPARISON:  None. FINDINGS: Lower chest: Minor basilar scarring/atelectasis worse on left. Normal heart size. No pericardial pleural effusion. Hepatobiliary: No focal liver abnormality is seen. No gallstones, gallbladder wall  thickening, or biliary dilatation. Pancreas: Unremarkable. No pancreatic ductal dilatation or surrounding inflammatory changes. Spleen: Scattered splenic granulomata noted. No other splenic abnormality or surrounding free fluid. Injury or hematoma. Adrenals/Urinary Tract: Adrenal glands are unremarkable. Kidneys are normal, without renal calculi, focal lesion, or hydronephrosis. Bladder is unremarkable. Stomach/Bowel: Negative for bowel obstruction significant dilatation, ileus, or free air. In the right lower quadrant, there is abnormal appendix with mild distension, mucosal enhancement, and surrounding inflammation/edema compatible with acute nonruptured appendicitis Appendix: Location: Right lower quadrant Diameter: 9 mm Appendicolith: Suspect subcentimeter tiny scattered hyperdense appendicoliths Mucosal hyper-enhancement: Present Extraluminal gas: Absent Periappendiceal collection: Absent Vascular/Lymphatic: Calcific atherosclerosis of the abdominal aorta. No occlusive process or aneurysm. Mesenteric and renal vasculature appear patent. No veno-occlusive process. No bulky adenopathy. Reproductive: Uterus and bilateral adnexa are unremarkable. Other: No abdominal wall hernia or abnormality. No abdominopelvic ascites. Musculoskeletal: Lower lumbar fusion hardware and degenerative changes noted. Bilateral hip arthroplasties. No acute osseous finding. IMPRESSION: Acute nonruptured appendicitis as detailed above. Aortic Atherosclerosis (ICD10-I70.0). Electronically Signed   By: Jerilynn Mages.  Shick M.D.   On: 03/30/2019 16:08     Kalman Drape, [facility] Surgery Please see amion for pager for the following: Cristine Polio, & Friday 7:00am - 4:30pm Thursdays 7:00am -11:30am

## 2019-03-31 NOTE — Anesthesia Postprocedure Evaluation (Signed)
Anesthesia Post Note  Patient: Joan [name]  Procedure(s) Performed: APPENDECTOMY LAPAROSCOPIC (N/A Abdomen)     Patient location during evaluation: PACU Anesthesia Type: General Level of consciousness: awake and alert, patient cooperative and oriented Pain management: pain level controlled Vital Signs Assessment: post-procedure vital signs reviewed and stable Respiratory status: spontaneous breathing, nonlabored ventilation, respiratory function stable and patient connected to nasal cannula oxygen Cardiovascular status: blood pressure returned to baseline and stable Postop Assessment: no apparent nausea or vomiting Anesthetic complications: no    Last Vitals:  Vitals:   03/31/19 1400 03/31/19 1415  BP: 110/70 121/70  Pulse: (!) 102 99  Resp: 18 18  Temp:    SpO2: 91% 92%    Last Pain:  Vitals:   03/31/19 1415  TempSrc:   PainSc: Asleep                 [name],[name]

## 2019-03-31 NOTE — Progress Notes (Signed)
RN was asked to speak with patients sister, [name], to update her. RN was given permission from patient. RN was then asked by patients daughter, Donella Stade, to call her with an update. RN was given permission to do as well. RN was then asked by patients POA, [name] (brother) to call him with an update. All 3 family members were updated via phone and all questions were answered to the best of my ability. Phone numbers below.  [name], brother, health power of attorney [phone] [name], sister, [phone] Donella Stade, daughter, [phone]

## 2019-03-31 NOTE — Transfer of Care (Signed)
Immediate Anesthesia Transfer of Care Note  Patient: Joan [name]  Procedure(s) Performed: APPENDECTOMY LAPAROSCOPIC (N/A Abdomen)  Patient Location: PACU  Anesthesia Type:General  Level of Consciousness: awake  Airway & Oxygen Therapy: Patient Spontanous Breathing and Patient connected to face mask oxygen  Post-op Assessment: Report given to RN, Post -op Vital signs reviewed and stable and Patient moving all extremities X 4  Post vital signs: Reviewed and stable  Last Vitals:  Vitals Value Taken Time  BP 135/73 03/31/19 1317  Temp    Pulse 110 03/31/19 1319  Resp 21 03/31/19 1319  SpO2 97 % 03/31/19 1319  Vitals shown include unvalidated device data.  Last Pain:  Vitals:   03/31/19 1123  TempSrc: Oral  PainSc:       Patients Stated Pain Goal: 3 ([id number])  Complications: No apparent anesthesia complications

## 2019-03-31 NOTE — Progress Notes (Signed)
Patient is having extreme itching and requested to page for Benadryl. Paged Dr. Ninfa Linden.

## 2019-03-31 NOTE — Op Note (Signed)
03/31/2019  1:05 PM  PATIENT:  Joan [name]  59 y.o. female  PRE-OPERATIVE DIAGNOSIS:  appendicitis  POST-OPERATIVE DIAGNOSIS: Acute gangrenous appendicitis, perforated  PROCEDURE:  Procedure(s): APPENDECTOMY LAPAROSCOPIC (N/A)  SURGEON:  Surgeon(s) and Role:    Ralene Ok, MD - Primary  ANESTHESIA:   local and general  EBL:  minimal   BLOOD ADMINISTERED:none  DRAINS: 6 [place] Blake drain in the right lower quadrant.  LOCAL MEDICATIONS USED:  BUPIVICAINE   SPECIMEN:  Source of Specimen: Appendix  DISPOSITION OF SPECIMEN:  PATHOLOGY  COUNTS:  YES  TOURNIQUET:  * No tourniquets in log *  DICTATION: .Dragon Dictation   Complications: none  Counts: reported as correct x 2  Findings:  The patient had a acutely inflamed, gangrenous and perforated appendix  Specimen: Appendix  Indications for procedure:  The patient is a 59 year old female with a history of periumbilical pain localized in the right lower quadrant patient had a CT scan which revealed signs consistent with acute appendicitis the patient back in for laparoscopic appendectomy.  Findings: Patient had an obliterated, gangrenous, perforated appendix.  A 0 PDS Endoloop was placed at the base.  A 19 [place] Blake drain was placed in the right lower quadrant brought out through the left lower quadrant stab incision.  Details of the procedure:The patient was taken back to the operating room. The patient was placed in supine position with bilateral SCDs in place.   The patient was prepped and draped in the usual sterile fashion.  After appropriate anitbiotics were confirmed, a time-out was confirmed and all facts were verified.    A pneumoperitoneum of 14 mmHg was obtained via a Veress needle technique in the left upper quadrant.  Quadrant quadrant.  A 5 mm trocar and 5 mm camera then placed intra-abdominally there is no injury to any intra-abdominal organs a 5 mm trocar was placed in the left lower quadrant  direct visualization.  There was some thin filmy adhesion to the anterior abdominal wall in the midline.  This was secondary to a large piece of mesh that was placed.  This was well incorporated.  Secondary to position of the appendix and the large cecum a #11 trocar was placed in the midline under direct visualization.  This will through the mesh.  At this time a 5 mm trocar was placed in the suprapubic region under direct visualization.  The appendix was identified and seen to be very inflamed and perforated at the base.  I attempted to circumferentially dissect the appendix from the surrounding tissue.  This was very adherent.  I traced down the appendix to the base.  There is an area of necrosis and perforation that was visualized.  This was as far down as I could safely dissect the appendix.  At this time a 0 PDS Endoloop was then placed at the base.  The appendix was transected at this place.  The minimal vascular was cauterized.   At this time a retrieval bag was placed into the abdomen.  A 19 [place] Blake drain was then brought into the abdominal cavity.  This was placed in the right lower quadrant over the area of the appendiceal stump.  This was brought out through the left lower quadrant trocar site.   The appendix and retrieval  bag was then retrieved via the supraumbilical port. #1 Vicryl was used to reapproximate the fascia at the umbilical port site x 2. The skin was reapproximated all port sites 3-0 Monocryl subcuticular fashion. The  skin was dressed with Dermabond.  The patient had the foley removed. The patient was awakened from general anesthesia was taken to recovery room in stable condition.     PLAN OF CARE: Admit for overnight observation  PATIENT DISPOSITION:  PACU - hemodynamically stable.   Delay start of Pharmacological VTE agent (>24hrs) due to surgical blood loss or risk of bleeding: yes

## 2019-03-31 NOTE — Progress Notes (Signed)
OT Cancellation Note  Patient Details Name: VALLERY KOSANKE MRN: [id number] DOB: 1960/06/18   Cancelled Treatment:    Reason Eval/Treat Not Completed: Other (comment).  Spoke to [employer].  Transport is coming soon to take her to sx.    Aubriauna Riner 03/31/2019, 10:52 AM  Karsten Ro, OTR/L Acute Rehabilitation Services 03/31/2019

## 2019-04-01 ENCOUNTER — Encounter: Payer: Self-pay | Admitting: *Deleted

## 2019-04-01 ENCOUNTER — Ambulatory Visit (INDEPENDENT_AMBULATORY_CARE_PROVIDER_SITE_OTHER): Payer: Medicare HMO | Admitting: Licensed Clinical Social Worker

## 2019-04-01 DIAGNOSIS — F411 Generalized anxiety disorder: Secondary | ICD-10-CM | POA: Diagnosis not present

## 2019-04-01 DIAGNOSIS — F4312 Post-traumatic stress disorder, chronic: Secondary | ICD-10-CM

## 2019-04-01 DIAGNOSIS — R443 Hallucinations, unspecified: Secondary | ICD-10-CM

## 2019-04-01 DIAGNOSIS — F0630 Mood disorder due to known physiological condition, unspecified: Secondary | ICD-10-CM | POA: Diagnosis not present

## 2019-04-01 LAB — CBC
HCT: 35.2 % — ABNORMAL LOW (ref 36.0–46.0)
Hemoglobin: 11.2 g/dL — ABNORMAL LOW (ref 12.0–15.0)
MCH: 30.6 pg (ref 26.0–34.0)
MCHC: 31.8 g/dL (ref 30.0–36.0)
MCV: 96.2 fL (ref 80.0–100.0)
Platelets: 184 10*3/uL (ref 150–400)
RBC: 3.66 MIL/uL — ABNORMAL LOW (ref 3.87–5.11)
RDW: 13.6 % (ref 11.5–15.5)
WBC: 17 10*3/uL — ABNORMAL HIGH (ref 4.0–10.5)
nRBC: 0 % (ref 0.0–0.2)

## 2019-04-01 LAB — GLUCOSE, CAPILLARY
Glucose-Capillary: 101 mg/dL — ABNORMAL HIGH (ref 70–99)
Glucose-Capillary: 108 mg/dL — ABNORMAL HIGH (ref 70–99)
Glucose-Capillary: 112 mg/dL — ABNORMAL HIGH (ref 70–99)
Glucose-Capillary: 121 mg/dL — ABNORMAL HIGH (ref 70–99)
Glucose-Capillary: 127 mg/dL — ABNORMAL HIGH (ref 70–99)
Glucose-Capillary: 128 mg/dL — ABNORMAL HIGH (ref 70–99)

## 2019-04-01 LAB — BASIC METABOLIC PANEL
Anion gap: 8 (ref 5–15)
BUN: 11 mg/dL (ref 6–20)
CO2: 29 mmol/L (ref 22–32)
Calcium: 8.4 mg/dL — ABNORMAL LOW (ref 8.9–10.3)
Chloride: 99 mmol/L (ref 98–111)
Creatinine, Ser: 1.02 mg/dL — ABNORMAL HIGH (ref 0.44–1.00)
GFR calc Af Amer: >60 mL/min (ref >60)
GFR calc non Af Amer: >60 mL/min (ref >60)
Glucose, Bld: 140 mg/dL — ABNORMAL HIGH (ref 70–99)
Potassium: 3.6 mmol/L (ref 3.5–5.1)
Sodium: 136 mmol/L (ref 135–145)

## 2019-04-01 LAB — SURGICAL PATHOLOGY

## 2019-04-01 MED ORDER — GABAPENTIN 300 MG PO CAPS
300.0000 mg | ORAL_CAPSULE | Freq: Two times a day (BID) | ORAL | Status: DC
  Administered 2019-04-01 – 2019-04-14 (×22): 300 mg via ORAL
  Filled 2019-04-01 (×23): qty 1

## 2019-04-01 MED ORDER — QUETIAPINE FUMARATE 50 MG PO TABS
50.0000 mg | ORAL_TABLET | Freq: Two times a day (BID) | ORAL | Status: DC
  Administered 2019-04-01 – 2019-04-11 (×17): 50 mg via ORAL
  Filled 2019-04-01 (×18): qty 1

## 2019-04-01 MED ORDER — QUETIAPINE FUMARATE 50 MG PO TABS: 50.0000 mg | ORAL_TABLET | Freq: Every day | ORAL | Status: DC

## 2019-04-01 MED ORDER — TRAZODONE HCL 100 MG PO TABS
100.0000 mg | ORAL_TABLET | Freq: Every day | ORAL | Status: DC
  Administered 2019-04-01 – 2019-04-13 (×11): 100 mg via ORAL
  Filled 2019-04-01 (×11): qty 1

## 2019-04-01 MED ORDER — VARENICLINE TARTRATE 0.5 MG X 11 & 1 MG X 42 PO MISC: 1.0000 | Freq: Two times a day (BID) | ORAL | Status: DC

## 2019-04-01 MED ORDER — VENLAFAXINE HCL ER 150 MG PO CP24
150.0000 mg | ORAL_CAPSULE | Freq: Every day | ORAL | Status: DC
  Administered 2019-04-02 – 2019-04-14 (×12): 150 mg via ORAL
  Filled 2019-04-01 (×12): qty 1

## 2019-04-01 MED ORDER — VARENICLINE TARTRATE 0.5 MG PO TABS
0.5000 mg | ORAL_TABLET | Freq: Two times a day (BID) | ORAL | Status: DC
  Administered 2019-04-01: 0.5 mg via ORAL
  Filled 2019-04-01 (×3): qty 1

## 2019-04-01 MED ORDER — PANTOPRAZOLE SODIUM 40 MG PO TBEC
40.0000 mg | DELAYED_RELEASE_TABLET | Freq: Every day | ORAL | Status: DC
  Administered 2019-04-01 – 2019-04-05 (×3): 40 mg via ORAL
  Filled 2019-04-01 (×3): qty 1

## 2019-04-01 MED ORDER — LEVOTHYROXINE SODIUM 25 MCG PO TABS
25.0000 ug | ORAL_TABLET | Freq: Every day | ORAL | Status: DC
  Administered 2019-04-02 – 2019-04-14 (×10): 25 ug via ORAL
  Filled 2019-04-01 (×11): qty 1

## 2019-04-01 MED ORDER — NICOTINE 14 MG/24HR TD PT24
14.0000 mg | MEDICATED_PATCH | Freq: Every day | TRANSDERMAL | Status: DC
  Filled 2019-04-01 (×13): qty 1

## 2019-04-01 NOTE — Consult Note (Signed)
[facility] CM Inpatient Consult   04/01/2019  Joan [name] 1961/01/12 [id number]    THN (Showing) Active Status:   Patient has been active with [facility]) Care Management services. Patient has been engaged by [facility] for continued disease management education on COPD, and noted case closure on 03/30/29.  Patient checked for 19% risk of unplanned readmission,and hospitalization under her [facility] insurance plan.  Per chart review and MD brief narrative, shows that patient was  admitted for acute appendicitis status post laparoscopic appendectomy 03/31/19.  Per PT/ OT evaluation notes show that recommendation is Home Health PT vs skilled nursing facility (SNF-needs assist/support at home)  Patient lives with her brother who is available intermittently for assistance (pt still to observe hip precautions post sx-12/20)   Plan:  Will notify [facility]) care management team to adjust patient's [facility] current status. Follow for disposition anddischarge recommendations. Will reassign patient post discharge for any care management follow-up needs as appropriate.  Her primary care provider is Dr. Hillard Danker with  at [facility] asproviding transition of care follow up.   Of note, [facility] Care Management services does not replace or interfere with any services that are needed or arranged by inpatient [facility] care management team.    For additional questions, please contact:  Karin Golden A. Trevan Messman, BSN, RN-BC [facility] Liaison Cell: [phone]

## 2019-04-01 NOTE — Progress Notes (Signed)
[facility]/Trauma Progress Note  1 Day Post-Op   Assessment/Plan Severe COPD on home O2 Severe medical comorbidities. Appreciate medical assistance in managing these.  Acute gangrenous appendicitis, perforation - S/P lap appy, JP drain, Dr. Rosendo Gros, 02/03 - await return of bowel function, high risk for ileus - ambulate, IS  FEN: CLD VTE: SCD's, lovenox ID: Cipro & Flagyl 02/02>>   WBC 17.0   afebrile Foley: none Follow up: TBD     LOS: 2 days    Subjective: CC: abdominal pain  Pain better than before surgery. No nausea or vomiting. No flatus. Some belching. Explained why she cannot eat until she has flatus. Encouraged ambulation. Nurse at bedside.   Objective: Vital signs in last 24 hours: Temp:  [97.5 F (36.4 C)-99.2 F (37.3 C)] 97.5 F (36.4 C) (02/04 0355) Pulse Rate:  [82-111] 85 (02/04 0355) Resp:  [14-20] 16 (02/04 0355) BP: (96-150)/(64-104) 96/77 (02/04 0355) SpO2:  [91 %-98 %] 93 % (02/04 0713) Weight:  [90.7 kg] 90.7 kg (02/03 1135) Last BM Date: 03/29/19  Intake/Output from previous day: 02/03 0701 - 02/04 0700 In: 2444.7 [P.O.:717; I.V.:1298.4; IV Piggyback:429.3] Out: 1505 [Urine:1350; Drains:155] Intake/Output this shift: Total I/O In: 206.4 [I.V.:204.9; IV Piggyback:1.5] Out: 250 [Urine:250]  PE:  Gen:  Alert, NAD  Card:  RRR, no M/G/R heard Pulm:  CTA, no W/R/R, rate and effort normal, [place] O2 Abd: Soft, ND, hypoactive BS, TTP RLQ with guarding, no peritonitis, incisions with glue intact are well appearing and are without drainage. Drain is SS  Skin: no rashes noted, warm and dry   Anti-infectives: Anti-infectives (From admission, onward)   Start     Dose/Rate Route Frequency Ordered Stop   03/31/19 0500  ciprofloxacin (CIPRO) IVPB 400 mg     400 mg 200 mL/hr over 60 Minutes Intravenous Every 12 hours 03/30/19 2242     03/31/19 0200  metroNIDAZOLE (FLAGYL) IVPB 500 mg     500 mg 100 mL/hr over 60 Minutes Intravenous  Every 8 hours 03/30/19 2242     03/30/19 1630  ciprofloxacin (CIPRO) IVPB 400 mg     400 mg 200 mL/hr over 60 Minutes Intravenous  Once 03/30/19 1620 03/30/19 1914   03/30/19 1630  metroNIDAZOLE (FLAGYL) IVPB 500 mg     500 mg 100 mL/hr over 60 Minutes Intravenous  Once 03/30/19 1620 03/30/19 1914      Lab Results:  Recent Labs    03/31/19 0417 04/01/19 0346  WBC 18.4* 17.0*  HGB 13.1 11.2*  HCT 41.7 35.2*  PLT 240 184   BMET Recent Labs    03/31/19 0417 04/01/19 0346  NA 136 136  K 3.9 3.6  CL 96* 99  CO2 24 29  GLUCOSE 120* 140*  BUN 17 11  CREATININE 1.23* 1.02*  CALCIUM 9.1 8.4*   PT/INR No results for input(s): LABPROT, INR in the last 72 hours. CMP     Component Value Date/Time   NA 136 04/01/2019 0346   NA 140 12/09/2018 1133   K 3.6 04/01/2019 0346   CL 99 04/01/2019 0346   CO2 29 04/01/2019 0346   GLUCOSE 140 (H) 04/01/2019 0346   BUN 11 04/01/2019 0346   BUN 18 12/09/2018 1133   CREATININE 1.02 (H) 04/01/2019 0346   CALCIUM 8.4 (L) 04/01/2019 0346   PROT 7.6 03/31/2019 0417   PROT 6.5 08/19/2017 0822   ALBUMIN 4.3 03/31/2019 0417   ALBUMIN 4.5 08/19/2017 0822   AST 20 03/31/2019 0417  ALT 28 03/31/2019 0417   ALKPHOS 125 03/31/2019 0417   BILITOT 1.2 03/31/2019 0417   BILITOT 0.3 08/19/2017 0822   GFRNONAA >60 04/01/2019 0346   GFRAA >60 04/01/2019 0346   Lipase     Component Value Date/Time   LIPASE 21 03/30/2019 1336    Studies/Results: CT ABDOMEN PELVIS W CONTRAST  Result Date: 03/30/2019 CLINICAL DATA:  Acute right lower quadrant pain nausea EXAM: CT ABDOMEN AND PELVIS WITH CONTRAST TECHNIQUE: Multidetector CT imaging of the abdomen and pelvis was performed using the standard protocol following bolus administration of intravenous contrast. CONTRAST:  171mL OMNIPAQUE IOHEXOL 300 MG/ML  SOLN COMPARISON:  None. FINDINGS: Lower chest: Minor basilar scarring/atelectasis worse on left. Normal heart size. No pericardial pleural effusion.  Hepatobiliary: No focal liver abnormality is seen. No gallstones, gallbladder wall thickening, or biliary dilatation. Pancreas: Unremarkable. No pancreatic ductal dilatation or surrounding inflammatory changes. Spleen: Scattered splenic granulomata noted. No other splenic abnormality or surrounding free fluid. Injury or hematoma. Adrenals/Urinary Tract: Adrenal glands are unremarkable. Kidneys are normal, without renal calculi, focal lesion, or hydronephrosis. Bladder is unremarkable. Stomach/Bowel: Negative for bowel obstruction significant dilatation, ileus, or free air. In the right lower quadrant, there is abnormal appendix with mild distension, mucosal enhancement, and surrounding inflammation/edema compatible with acute nonruptured appendicitis Appendix: Location: Right lower quadrant Diameter: 9 mm Appendicolith: Suspect subcentimeter tiny scattered hyperdense appendicoliths Mucosal hyper-enhancement: Present Extraluminal gas: Absent Periappendiceal collection: Absent Vascular/Lymphatic: Calcific atherosclerosis of the abdominal aorta. No occlusive process or aneurysm. Mesenteric and renal vasculature appear patent. No veno-occlusive process. No bulky adenopathy. Reproductive: Uterus and bilateral adnexa are unremarkable. Other: No abdominal wall hernia or abnormality. No abdominopelvic ascites. Musculoskeletal: Lower lumbar fusion hardware and degenerative changes noted. Bilateral hip arthroplasties. No acute osseous finding. IMPRESSION: Acute nonruptured appendicitis as detailed above. Aortic Atherosclerosis (ICD10-I70.0). Electronically Signed   By: Jerilynn Mages.  Shick M.D.   On: 03/30/2019 16:08     Kalman Drape, [facility] Surgery Please see amion for pager for the following: Cristine Polio, & Friday 7:00am - 4:30pm Thursdays 7:00am -11:30am

## 2019-04-01 NOTE — Telephone Encounter (Signed)
This encounter was created in error - please disregard.

## 2019-04-01 NOTE — Evaluation (Signed)
Physical Therapy Evaluation Patient Details Name: Joan [name] MRN: [id number] DOB: 31-May-1960 Today's Date: 04/01/2019   History of Present Illness  Patient is 59 y.o. female s/p laproscopic appendectomy on 03/31/19 with PMH significant for HTN, hypothyroidism, GERD, COPD, DM, chronic pain, anxiety, depression, and OA. She also had a recent Lt THR on 01/26/19 and continues to be on posterior hip precautions.    Clinical Impression  Joan [name] is 59 y.o. female admitted with above HPI and diagnosis. Patient is currently limited by functional impairments below (see PT problem list). Patient lives with her brother who is available intermittently for assistnce and is modified independent with RW for mobility. She had a Lt THR via posterior approach on 01/26/19 and is still to observe posterior hip precautions per orthopedic surgeon. Patient will benefit from continued skilled PT interventions to address impairments and progress independence with mobility, recommending HHPT with supervision/assist for mobility. Acute PT will follow and progress as able.     Follow Up Recommendations Home health PT;Supervision for mobility/OOB    Equipment Recommendations  None recommended by PT    Recommendations for Other Services       Precautions / Restrictions Precautions Precautions: Fall;Posterior Hip Precaution Comments: Per Dr Aurea Graff note on 1/18, pt is to be mindful of hip precautions over the next few months Restrictions Weight Bearing Restrictions: No      Mobility  Bed Mobility Overal bed mobility: Needs Assistance Bed Mobility: Supine to Sit     Supine to sit: Min guard;HOB elevated     General bed mobility comments: pt slow to mobilize and required cues for safety. pt using bed rails with HOB elevated.  Transfers Overall transfer level: Needs assistance Equipment used: Rolling walker (2 wheeled) Transfers: Sit to/from Stand Sit to Stand: Min assist         General transfer  comment: cues for safe hand placement and technique with RW, assist required for power up from EOB and toilet.  Ambulation/Gait Ambulation/Gait assistance: Min assist Gait Distance (Feet): 70 Feet Assistive device: Rolling walker (2 wheeled) Gait Pattern/deviations: Step-through pattern;Decreased stride length;Drifts right/left;Trunk flexed Gait velocity: decreased and unsteady   General Gait Details: pt slow mobilize and min assist required to steady throughout gait. cues required throughout for posture and safe proximity to RW. pt stopping to rest 4x with SpO2 remaining at 93-96% on 3L/min.  Stairs            Wheelchair Mobility    Modified Rankin (Stroke Patients Only)       Balance Overall balance assessment: Needs assistance Sitting-balance support: Feet supported Sitting balance-Leahy Scale: Fair     Standing balance support: During functional activity Standing balance-Leahy Scale: Poor          Pertinent Vitals/Pain Pain Assessment: Faces Faces Pain Scale: No hurt    Home Living Family/patient expects to be discharged to:: Private residence Living Arrangements: Other relatives(pt lives with her brother in a duplex) Available Help at Discharge: Family;Available PRN/intermittently(MWF) Type of Home: House Home Access: Stairs to enter Entrance Stairs-Rails: Right Entrance Stairs-Number of Steps: 3 with rails and then 1 threshold Home Layout: One level Home Equipment: Walker - 2 wheels;Walker - 4 wheels;Cane - single point;Bedside commode;Tub bench Additional Comments: patient uses supplemental O2 at home    Prior Function Level of Independence: Independent with assistive device(s)         Comments: pt uses RW at home for mobility, her brother is with her MWF to assist PRN.  Hand Dominance   Dominant Hand: Right    Extremity/Trunk Assessment   Upper Extremity Assessment Upper Extremity Assessment: Defer to OT evaluation    Lower Extremity  Assessment Lower Extremity Assessment: Generalized weakness    Cervical / Trunk Assessment Cervical / Trunk Assessment: Kyphotic  Communication   Communication: No difficulties  Cognition Arousal/Alertness: Awake/alert Behavior During Therapy: Restless Overall Cognitive Status: No family/caregiver present to determine baseline cognitive functioning      General Comments: pt alert and oriented to place, self, and situation. unable to state date accurately and takes extra time with cues. pt slow with processing.      General Comments      Exercises     Assessment/Plan    PT Assessment Patient needs continued PT services  PT Problem List Decreased strength;Decreased balance;Decreased mobility;Decreased activity tolerance;Decreased knowledge of use of DME;Decreased safety awareness;Decreased knowledge of precautions       PT Treatment Interventions DME instruction;Functional mobility training;Balance training;Patient/family education;Therapeutic activities;Gait training;Therapeutic exercise;Stair training    PT Goals (Current goals can be found in the Care Plan section)  Acute Rehab PT Goals Patient Stated Goal: to get home again PT Goal Formulation: With patient Time For Goal Achievement: 04/15/19 Potential to Achieve Goals: Good    Frequency Min 3X/week    AM-PAC PT "6 Clicks" Mobility  Outcome Measure Help needed turning from your back to your side while in a flat bed without using bedrails?: A Little Help needed moving from lying on your back to sitting on the side of a flat bed without using bedrails?: A Little Help needed moving to and from a bed to a chair (including a wheelchair)?: A Little Help needed standing up from a chair using your arms (e.g., wheelchair or bedside chair)?: A Little Help needed to walk in hospital room?: A Little Help needed climbing 3-5 steps with a railing? : A Lot 6 Click Score: 17    End of Session Equipment Utilized During Treatment:  Gait belt Activity Tolerance: Patient tolerated treatment well Patient left: with call bell/phone within reach;with chair alarm set;in chair Nurse Communication: Mobility status PT Visit Diagnosis: Unsteadiness on feet (R26.81);Muscle weakness (generalized) (M62.81);Difficulty in walking, not elsewhere classified (R26.2)    Time: [id number] PT Time Calculation (min) (ACUTE ONLY): 38 min   Charges:   PT Evaluation $PT Eval Low Complexity: 1 Low PT Treatments $Gait Training: 8-22 mins $Therapeutic Activity: 8-22 mins        Verner Mould, DPT Physical Therapist with [facility] [phone]  04/01/2019 2:07 PM

## 2019-04-01 NOTE — Progress Notes (Signed)
PROGRESS NOTE    Joan [name]  [id number] DOB: 07/14/1960 DOA: 03/30/2019 PCP: [name], MD   Brief Narrative:  Patient is a 59 year old female with history of COPD, on home O2 2 L at night, 2 to 3 L on exertion only during the day if needed, hypothyroidism, anxiety/bipolar disorder, diet controlled diabetes mellitus, hypertension, hyperlipidemia presented with right lower quadrant abdominal pain. Patient reported that pain has been intermittent for the last month or so however since yesterday, pain has become constant, severe, 10/ 10.  Reported nausea, dry heaving but no vomiting.  Denies any fevers or chills, coughing or any new shortness of breath.  No dysuria.  Patient is a s/p laparoscopic appendicectomy.  Tolerated well, still has not had flatus.  High chances of ileus.  Started on clear liquid diet will advance to full liquids tomorrow.   Assessment & Plan:   Principal Problem:   Acute appendicitis Active Problems:   Anxiety   Essential hypertension   Chronic respiratory failure with hypoxia (HCC)   Dyslipidemia   Hypothyroidism due to acquired atrophy of thyroid   GERD without esophagitis   Hypertension   COPD (chronic obstructive pulmonary disease) (HCC)  # Abdominal pain secondary to acute nonruptured appendicitis -CT abdomen showed acute nonruptured appendicitis, - management per general surgery, primary service -Currently on IV antibiotics, placed on IV fluids -S/p laparoscopic appendicectomy, postop day 1 -She has not had passed flatus, high chances of ileus. -Started clear liquids, advance to full liquids tomorrow    # Essential hypertension -BP slightly elevated likely due to pain, Continue atenolol 25 mg daily and HCTZ 25 mg daily at home. - continue IV Lopressor 2.5 mg IV every 6 hours to avoid reflex tachycardia.  # History of COPD, chronic respiratory failure with hypoxia (HCC) -Currently no acute wheezing, continue O2 2 L, stable at  baseline.  Has quit smoking. -Incentive spirometry after surgery -Restarted Combivent inhaler, Dulera -Resume Daliresp once taking p.o.  # Diabetes mellitus: Diet controlled -Patient reports that she does not check her blood sugars at home and is not on any oral hypoglycemics -Monitor CBGs, placed on sliding scale insulin if elevated -will check hemoglobin A1c, last HbA1c 6.7 in 11/2018  # Dyslipidemia -Hold statin while n.p.o., resume Lipitor 20 mg daily once taking p.o. after surgery  # Hypothyroidism  -Change to IV Synthroid 12.5 MCG daily while awaiting NPO   # GERD without esophagitis -Placed on IV PPI (takes Protonix 40 mg daily at home)   # Anxiety/bipolar disorder -Currently holding Seroquel nightly while n.p.o.  [facility] medicine service will follow the patient, thank you for this consultation.  DVT prophylaxis:  Code Status:  Full Family Communication: Discussed with patient in detail. Disposition Plan: s/p Lap Appendectomy , anticipated discharge home in 1 to 2 days   Consultants:   None.  Procedures:  Lap. Appendectomy   Antimicrobials:  Anti-infectives (From admission, onward)   Start     Dose/Rate Route Frequency Ordered Stop   03/31/19 0500  ciprofloxacin (CIPRO) IVPB 400 mg     400 mg 200 mL/hr over 60 Minutes Intravenous Every 12 hours 03/30/19 2242     03/31/19 0200  metroNIDAZOLE (FLAGYL) IVPB 500 mg     500 mg 100 mL/hr over 60 Minutes Intravenous Every 8 hours 03/30/19 2242     03/30/19 1630  ciprofloxacin (CIPRO) IVPB 400 mg     400 mg 200 mL/hr over 60 Minutes Intravenous  Once 03/30/19 1620 03/30/19 1914  03/30/19 1630  metroNIDAZOLE (FLAGYL) IVPB 500 mg     500 mg 100 mL/hr over 60 Minutes Intravenous  Once 03/30/19 1620 03/30/19 1914     Subjective: Patient was seen at bedside.  She reports her pain is better than before surgery.   She states was told to try clear liquid diet but she wants to have a full diet.  Objective: Vitals:     04/01/19 0713 04/01/19 0955 04/01/19 1200 04/01/19 1357  BP:  117/65 116/65 113/73  Pulse:  87 90 93  Resp:  17  17  Temp:  98.1 F (36.7 C)  98.1 F (36.7 C)  TempSrc:  Oral  Oral  SpO2: 93% 97% 92% 100%  Weight:      Height:        Intake/Output Summary (Last 24 hours) at 04/01/2019 1506 Last data filed at 04/01/2019 1353 Gross per 24 hour  Intake 2601.13 ml  Output 1585 ml  Net 1016.13 ml   Filed Weights   03/30/19 1259 03/31/19 1135  Weight: 90.7 kg 90.7 kg    Examination:  General exam: Appears calm and comfortable  Respiratory system: Clear to auscultation. Respiratory effort normal. Cardiovascular system: S1 & S2 heard, RRR. No JVD, murmurs, rubs, gallops or clicks. No pedal edema. Gastrointestinal system: Abdomen is nondistended, soft , Mildly tender, scar noted.  Central nervous system: Alert and oriented. No focal neurological deficits. Extremities:  No swelling, No leg pain. Skin: No rashes, lesions or ulcers Psychiatry: Judgement and insight appear normal. Mood & affect appropriate.     Data Reviewed: I have personally reviewed following labs and imaging studies  CBC: Recent Labs  Lab 03/30/19 1336 03/31/19 0417 04/01/19 0346  WBC 21.0* 18.4* 17.0*  HGB 14.4 13.1 11.2*  HCT 43.1 41.7 35.2*  MCV 93.3 97.4 96.2  PLT 299 240 [id number]   Basic Metabolic Panel: Recent Labs  Lab 03/30/19 1336 03/31/19 0417 04/01/19 0346  NA 136 136 136  K 3.8 3.9 3.6  CL 96* 96* 99  CO2 26 24 29   GLUCOSE 142* 120* 140*  BUN 14 17 11   CREATININE 1.06* 1.23* 1.02*  CALCIUM 9.7 9.1 8.4*   GFR: Estimated Creatinine Clearance: 69.1 mL/min (A) (by C-G formula based on SCr of 1.02 mg/dL (H)). Liver Function Tests: Recent Labs  Lab 03/30/19 1336 03/31/19 0417  AST 25 20  ALT 31 28  ALKPHOS 135* 125  BILITOT 0.9 1.2  PROT 7.4 7.6  ALBUMIN 4.2 4.3   Recent Labs  Lab 03/30/19 1336  LIPASE 21   No results for input(s): AMMONIA in the last 168  hours. Coagulation Profile: No results for input(s): INR, PROTIME in the last 168 hours. Cardiac Enzymes: No results for input(s): CKTOTAL, CKMB, CKMBINDEX, TROPONINI in the last 168 hours. BNP (last 3 results) No results for input(s): PROBNP in the last 8760 hours. HbA1C: Recent Labs    03/31/19 0417  HGBA1C 6.2*   CBG: Recent Labs  Lab 03/31/19 2000 03/31/19 2341 04/01/19 0350 04/01/19 0748 04/01/19 1219  GLUCAP 131* 103* 128* 121* 101*   Lipid Profile: No results for input(s): CHOL, HDL, LDLCALC, TRIG, CHOLHDL, LDLDIRECT in the last 72 hours. Thyroid Function Tests: Recent Labs    03/30/19 1915  TSH 1.156   Anemia Panel: No results for input(s): VITAMINB12, FOLATE, FERRITIN, TIBC, IRON, RETICCTPCT in the last 72 hours. Sepsis Labs: No results for input(s): PROCALCITON, LATICACIDVEN in the last 168 hours.  Recent Results (from the past 240 hour(s))  Respiratory Panel by RT PCR (Flu A&B, Covid) - Nasopharyngeal Swab     Status: None   Collection Time: 03/30/19  5:01 PM   Specimen: Nasopharyngeal Swab  Result Value Ref Range Status   SARS Coronavirus 2 by RT PCR NEGATIVE NEGATIVE Final    Comment: (NOTE) SARS-CoV-2 target nucleic acids are NOT DETECTED. The SARS-CoV-2 RNA is generally detectable in upper respiratoy specimens during the acute phase of infection. The lowest concentration of SARS-CoV-2 viral copies this assay can detect is 131 copies/mL. A negative result does not preclude SARS-Cov-2 infection and should not be used as the sole basis for treatment or other patient management decisions. A negative result may occur with  improper specimen collection/handling, submission of specimen other than nasopharyngeal swab, presence of viral mutation(s) within the areas targeted by this assay, and inadequate number of viral copies (<131 copies/mL). A negative result must be combined with clinical observations, patient history, and epidemiological information.  The expected result is Negative. Fact Sheet for Patients:  PinkCheek.be Fact Sheet for Healthcare Providers:  GravelBags.it This test is not yet ap proved or cleared by the [place] FDA and  has been authorized for detection and/or diagnosis of SARS-CoV-2 by FDA under an Emergency Use Authorization (EUA). This EUA will remain  in effect (meaning this test can be used) for the duration of the COVID-19 declaration under Section 564(b)(1) of the Act, 21 U.S.C. section 360bbb-3(b)(1), unless the authorization is terminated or revoked sooner.    Influenza A by PCR NEGATIVE NEGATIVE Final   Influenza B by PCR NEGATIVE NEGATIVE Final    Comment: (NOTE) The Xpert Xpress SARS-CoV-2/FLU/RSV assay is intended as an aid in  the diagnosis of influenza from Nasopharyngeal swab specimens and  should not be used as a sole basis for treatment. Nasal washings and  aspirates are unacceptable for Xpert Xpress SARS-CoV-2/FLU/RSV  testing. Fact Sheet for Patients: PinkCheek.be Fact Sheet for Healthcare Providers: GravelBags.it This test is not yet approved or cleared by the [place] FDA and  has been authorized for detection and/or diagnosis of SARS-CoV-2 by  FDA under an Emergency Use Authorization (EUA). This EUA will remain  in effect (meaning this test can be used) for the duration of the  Covid-19 declaration under Section 564(b)(1) of the Act, 21  U.S.C. section 360bbb-3(b)(1), unless the authorization is  terminated or revoked. Performed at [facility], [place] [address], [place], [place] [address]      Radiology Studies: CT ABDOMEN PELVIS W CONTRAST  Result Date: 03/30/2019 CLINICAL DATA:  Acute right lower quadrant pain nausea EXAM: CT ABDOMEN AND PELVIS WITH CONTRAST TECHNIQUE: Multidetector CT imaging of the abdomen and pelvis was performed using  the standard protocol following bolus administration of intravenous contrast. CONTRAST:  185mL OMNIPAQUE IOHEXOL 300 MG/ML  SOLN COMPARISON:  None. FINDINGS: Lower chest: Minor basilar scarring/atelectasis worse on left. Normal heart size. No pericardial pleural effusion. Hepatobiliary: No focal liver abnormality is seen. No gallstones, gallbladder wall thickening, or biliary dilatation. Pancreas: Unremarkable. No pancreatic ductal dilatation or surrounding inflammatory changes. Spleen: Scattered splenic granulomata noted. No other splenic abnormality or surrounding free fluid. Injury or hematoma. Adrenals/Urinary Tract: Adrenal glands are unremarkable. Kidneys are normal, without renal calculi, focal lesion, or hydronephrosis. Bladder is unremarkable. Stomach/Bowel: Negative for bowel obstruction significant dilatation, ileus, or free air. In the right lower quadrant, there is abnormal appendix with mild distension, mucosal enhancement, and surrounding inflammation/edema compatible with acute nonruptured appendicitis Appendix: Location: Right lower quadrant Diameter:  9 mm Appendicolith: Suspect subcentimeter tiny scattered hyperdense appendicoliths Mucosal hyper-enhancement: Present Extraluminal gas: Absent Periappendiceal collection: Absent Vascular/Lymphatic: Calcific atherosclerosis of the abdominal aorta. No occlusive process or aneurysm. Mesenteric and renal vasculature appear patent. No veno-occlusive process. No bulky adenopathy. Reproductive: Uterus and bilateral adnexa are unremarkable. Other: No abdominal wall hernia or abnormality. No abdominopelvic ascites. Musculoskeletal: Lower lumbar fusion hardware and degenerative changes noted. Bilateral hip arthroplasties. No acute osseous finding. IMPRESSION: Acute nonruptured appendicitis as detailed above. Aortic Atherosclerosis (ICD10-I70.0). Electronically Signed   By: Jerilynn Mages.  Shick M.D.   On: 03/30/2019 16:08     Scheduled Meds: . acetaminophen  1,000 mg  Oral Q8H  . insulin aspart  0-9 Units Subcutaneous Q4H  . ipratropium-albuterol  3 mL Nebulization BID  . levothyroxine  12.5 mcg Intravenous Daily  . metoprolol tartrate  2.5 mg Intravenous Q6H  . mometasone-formoterol  2 puff Inhalation BID  . pantoprazole  40 mg Oral Daily  . sodium chloride flush  3 mL Intravenous Once   Continuous Infusions: . sodium chloride 75 mL/hr at 04/01/19 0624  . ciprofloxacin Stopped (04/01/19 0524)  . metronidazole 500 mg (04/01/19 0849)     LOS: 2 days    Time spent: 25 mins.    Shawna Clamp, MD [facility]   If 7PM-7AM, please contact night-coverage

## 2019-04-01 NOTE — Plan of Care (Signed)

## 2019-04-01 NOTE — Progress Notes (Signed)
Virtual Visit via Telephone Note  I connected with Joan [name] on 04/01/19 at  1:00 PM EST by telephone and verified that I am speaking with the correct person using two identifiers.   I discussed the limitations, risks, security and privacy concerns of performing an evaluation and management service by telephone and the availability of in person appointments. I also discussed with the patient that there may be a patient responsible charge related to this service. The patient expressed understanding and agreed to proceed.   I discussed the assessment and treatment plan with the patient. The patient was provided an opportunity to ask questions and all were answered. The patient agreed with the plan and demonstrated an understanding of the instructions.   The patient was advised to call back or seek an in-person evaluation if the symptoms worsen or if the condition fails to improve as anticipated.  I provided 40 minutes of non-face-to-face time during this encounter.  THERAPIST PROGRESS NOTE  Session Time: 1:00 PM to 1:40 PM  Participation Level: Active  Behavioral Response: CasualAlertAnxious and presents with euthymic and anxious in session.   Type of Therapy: Individual Therapy  Treatment Goals addressed:  PTSD, coping, hallucinations  Interventions: Solution Focused, Strength-based, Supportive, Reframing and Other: coping  Summary: YOHANA [name] is a 59 y.o. female who presents with rushed to the hospital, lots of pain, appendicitis. Doctor said not worth the risk to remove. Patient insisted and realized that it both high risk to keep it or remove it then better strategy was to remove it and did remove it. "I lived through it" Doctor wanted her to know how serious it was and her choice of what to do. Patient reviewed options for her that it was dDangerous to keep it, and dangerous to remove it. Risk high for surgery because of lungs.  Therapist asked how patient felt and she said she  just wanted to get pain relief, she just didn't care because of pain. Didn't get much relief after surgery (although on pain meds) able to move a little. Surgery yesterday. Doctor says she needs a 24 hour care facility. She needs somebody with her morning and night. she needs to make a decision and will probably call sister Joan [name] and Joan [name]. Patient that healing from surgery "It is going to be a process to deal with." She has concern about going into a facility due to coronavirus. Patient on opiates and brother raised concern that she is an addict and patient realizes there is risk of taking a drug that is addicting but shares that not into drugs and pills and shooting. She didn't use that stuff her drug was was stimulant and didn't like how they made her feel. Relates to therapist :are you crazy" can see how it would worsen her condition. Reviewed feels fine and now just where is she going to heal. Going to go home. More a chance living going home than nursing facility around people who are sick. Brother stayed up all night crying indicating the concern about this surgery. In reviewing therapy patient shares "Put my two feet in water no sooner do it then pull one out." discussed looking at recovery from surgery as a process, looking at journey a little differently now as it relates to therapy. Patient commented "Pick your battles." What is worth putting her over the edge. Doesn't want to be angry rather be quiet. Can be nasty and tyrant when angry, that is she doesn't bother with anybody to make her  angry. Nervous about not having a bowl movement, hurts to push.  Cannot leave until bowel movement.  Therapist pointed out to relax and let her body push it out, will help to process.  Therapy session ended as Dr. Council Mechanic to check on patient  Suicidal/Homicidal: No  Therapist Response: Therapist reviewed symptoms, discussed stressors, facilitated expression of thoughts and feelings.  Provided supportive intervention  and positive feedback for patient being past risky surgery and on to recovery.  Reviewed focusing on what is most important now which is her recovery from the surgery.  Reviewed we will make some difference in therapeutic approach but therapeutic interventions helpful as patient begins process of healing from surgery.  Highlighted patient's own insights to help with coping including her recognition of recovery as a process looking at what is the next step in the process.  Reviewed looking at options patient has right now, helping to facilitate her thought process to help her making good choices of next step.  Continue to encourage patient with recognition of helpfulness of mental health by not being triggered by anger. Encouraged patient with her own insight and strategy for coping to pick your battles. Provided strength based intervention.    Plan: Return again in 2 weeks. 2.Therapist work with patient on mood regulation, stress management, management of symptoms, explore and help work on grief and PTSD symptoms as needed  Diagnosis: Axis I:  PTSD, hallucinations, GAD, Mood Disorder in conditions classified elsewhere    Axis II: No diagnosis    [name], LCSW 04/01/2019

## 2019-04-01 NOTE — Evaluation (Signed)
Occupational Therapy Evaluation Patient Details Name: Joan [name] MRN: [id number] DOB: 22-Nov-1960 Today's Date: 04/01/2019    History of Present Illness Patient is 59 y.o. female s/p laproscopic appendectomy on 03/31/19 with PMH significant for HTN, hypothyroidism, GERD, COPD, DM, chronic pain, anxiety, depression, and OA. She also had a recent Lt THR on 01/26/19 and continues to be on posterior hip precautions.   Clinical Impression   Pt admitted with the above. Pt currently with functional limitations due to the deficits listed below (see OT Problem List).  Pt will benefit from skilled OT to increase their safety and independence with ADL and functional mobility for ADL to facilitate discharge to venue listed below.   Pt will need A at home.  Pt would like to go home but does not have support. She did mention sisters that might could A     Follow Up Recommendations  SNF    Equipment Recommendations  None recommended by OT    Recommendations for Other Services       Precautions / Restrictions Precautions Precautions: Fall;Posterior Hip Precaution Comments: Per Dr Nilsa Nutting note on 1/18, pt is to be mindful of hip precautions over the next few months Restrictions Weight Bearing Restrictions: No      Mobility Bed Mobility Overal bed mobility: Needs Assistance Bed Mobility: Supine to Sit     Supine to sit: Min guard;HOB elevated     General bed mobility comments: pt in chair  Transfers Overall transfer level: Needs assistance Equipment used: Rolling walker (2 wheeled) Transfers: Sit to/from Stand Sit to Stand: Min assist         General transfer comment: cues for hand placement    Balance Overall balance assessment: Needs assistance Sitting-balance support: Feet supported Sitting balance-Leahy Scale: Fair     Standing balance support: During functional activity Standing balance-Leahy Scale: Poor                             ADL either performed or  assessed with clinical judgement   ADL Overall ADL's : Needs assistance/impaired Eating/Feeding: Set up;Sitting   Grooming: Set up;Sitting   Upper Body Bathing: Set up;Sitting   Lower Body Bathing: Maximal assistance;Sit to/from stand;Cueing for safety;Cueing for sequencing;With caregiver independent assisting Lower Body Bathing Details (indicate cue type and reason): needs AE Upper Body Dressing : Minimal assistance;Sitting   Lower Body Dressing: Maximal assistance;Sit to/from stand;Cueing for compensatory techniques;Cueing for safety;Cueing for sequencing;Adhering to hip precautions     Toilet Transfer Details (indicate cue type and reason): did not perform Toileting- Clothing Manipulation and Hygiene: Maximal assistance;Sit to/from stand;Cueing for compensatory techniques;Cueing for safety;Cueing for sequencing               Vision Patient Visual Report: No change from baseline              Pertinent Vitals/Pain Pain Assessment: Faces Pain Score: 5  Faces Pain Scale: No hurt Pain Location: general Pain Descriptors / Indicators: Sore;Guarding;Grimacing Pain Intervention(s): Limited activity within patient's tolerance     Hand Dominance Right   Extremity/Trunk Assessment Upper Extremity Assessment Upper Extremity Assessment: Generalized weakness   Lower Extremity Assessment Lower Extremity Assessment: Generalized weakness   Cervical / Trunk Assessment Cervical / Trunk Assessment: Kyphotic   Communication Communication Communication: No difficulties   Cognition Arousal/Alertness: Awake/alert Behavior During Therapy: Restless Overall Cognitive Status: No family/caregiver present to determine baseline cognitive functioning  General Comments: pt alert and oriented to place, self, and situation. unable to state date accurately and takes extra time with cues. pt slow with processing.   General Comments                Home Living Family/patient expects to be discharged to:: Private residence Living Arrangements: Other relatives(pt lives with her brother in a duplex) Available Help at Discharge: Family;Available PRN/intermittently(MWF) Type of Home: House Home Access: Stairs to enter [employer] of Steps: 3 with rails and then 1 threshold Entrance Stairs-Rails: Right Home Layout: One level     Bathroom Shower/Tub: Chief Strategy Officer: Standard Bathroom Accessibility: Yes   Home Equipment: Environmental consultant - 2 wheels;Walker - 4 wheels;Cane - single point;Bedside commode;Tub bench   Additional Comments: patient uses supplemental O2 at home      Prior Functioning/Environment Level of Independence: Independent with assistive device(s)        Comments: pt uses RW at home for mobility, her brother is with her MWF to assist PRN.        OT Problem List: Decreased activity tolerance;Impaired balance (sitting and/or standing);Decreased knowledge of precautions;Decreased knowledge of use of DME or AE      OT Treatment/Interventions: Self-care/ADL training;Patient/family education;DME and/or AE instruction    OT Goals(Current goals can be found in the care plan section) Acute Rehab OT Goals Patient Stated Goal: to get home again OT Goal Formulation: With patient Time For Goal Achievement: 04/15/19 Potential to Achieve Goals: Good  OT Frequency: Min 2X/week   Barriers to D/C: Decreased caregiver support             AM-PAC OT "6 Clicks" Daily Activity     Outcome Measure Help from another person eating meals?: None Help from another person taking care of personal grooming?: None Help from another person toileting, which includes using toliet, bedpan, or urinal?: A Lot Help from another person bathing (including washing, rinsing, drying)?: A Little Help from another person to put on and taking off regular upper body clothing?: A Little Help from another person to put on  and taking off regular lower body clothing?: A Lot 6 Click Score: 18   End of Session Equipment Utilized During Treatment: Rolling walker Nurse Communication: Mobility status  Activity Tolerance: Patient limited by pain Patient left: in chair;with call bell/phone within reach  OT Visit Diagnosis: Unsteadiness on feet (R26.81);Repeated falls (R29.6);Muscle weakness (generalized) (M62.81);Pain                Time: 1205-1220 OT Time Calculation (min): 15 min Charges:  OT General Charges $OT Visit: 1 Visit OT Evaluation $OT Eval Moderate Complexity: 1 Mod  Lise Auer, OT Acute Rehabilitation Services [phone] Office- [phone]     Sydney Azure, Karin Golden D 04/01/2019, 2:21 PM

## 2019-04-02 ENCOUNTER — Inpatient Hospital Stay (HOSPITAL_COMMUNITY): Payer: Medicare HMO

## 2019-04-02 DIAGNOSIS — Z4682 Encounter for fitting and adjustment of non-vascular catheter: Secondary | ICD-10-CM | POA: Diagnosis not present

## 2019-04-02 LAB — GLUCOSE, CAPILLARY
Glucose-Capillary: 102 mg/dL — ABNORMAL HIGH (ref 70–99)
Glucose-Capillary: 103 mg/dL — ABNORMAL HIGH (ref 70–99)
Glucose-Capillary: 130 mg/dL — ABNORMAL HIGH (ref 70–99)
Glucose-Capillary: 140 mg/dL — ABNORMAL HIGH (ref 70–99)
Glucose-Capillary: 145 mg/dL — ABNORMAL HIGH (ref 70–99)
Glucose-Capillary: 155 mg/dL — ABNORMAL HIGH (ref 70–99)
Glucose-Capillary: 95 mg/dL (ref 70–99)

## 2019-04-02 LAB — CBC
HCT: 32.9 % — ABNORMAL LOW (ref 36.0–46.0)
Hemoglobin: 10.5 g/dL — ABNORMAL LOW (ref 12.0–15.0)
MCH: 30.8 pg (ref 26.0–34.0)
MCHC: 31.9 g/dL (ref 30.0–36.0)
MCV: 96.5 fL (ref 80.0–100.0)
Platelets: 187 10*3/uL (ref 150–400)
RBC: 3.41 MIL/uL — ABNORMAL LOW (ref 3.87–5.11)
RDW: 13.6 % (ref 11.5–15.5)
WBC: 11.7 10*3/uL — ABNORMAL HIGH (ref 4.0–10.5)
nRBC: 0 % (ref 0.0–0.2)

## 2019-04-02 LAB — BASIC METABOLIC PANEL
Anion gap: 8 (ref 5–15)
BUN: 9 mg/dL (ref 6–20)
CO2: 29 mmol/L (ref 22–32)
Calcium: 8.6 mg/dL — ABNORMAL LOW (ref 8.9–10.3)
Chloride: 101 mmol/L (ref 98–111)
Creatinine, Ser: 0.93 mg/dL (ref 0.44–1.00)
GFR calc Af Amer: >60 mL/min (ref >60)
GFR calc non Af Amer: >60 mL/min (ref >60)
Glucose, Bld: 119 mg/dL — ABNORMAL HIGH (ref 70–99)
Potassium: 3.3 mmol/L — ABNORMAL LOW (ref 3.5–5.1)
Sodium: 138 mmol/L (ref 135–145)

## 2019-04-02 MED ORDER — POTASSIUM CHLORIDE 20 MEQ PO PACK
40.0000 meq | PACK | Freq: Once | ORAL | Status: DC
  Filled 2019-04-02: qty 2

## 2019-04-02 MED ORDER — LORAZEPAM 2 MG/ML IJ SOLN
1.0000 mg | INTRAMUSCULAR | Status: DC | PRN
  Administered 2019-04-02 – 2019-04-04 (×5): 1 mg via INTRAVENOUS
  Filled 2019-04-02 (×5): qty 1

## 2019-04-02 NOTE — Telephone Encounter (Signed)
Called [employer], Patient was approved for patient assistance for Combivent inhaler 04/01/2019 through 03/31/2020. Inhaler will deliver to patient on 04/14/19. Approval letter was mail to patient.  Called patient to advise, left message.Marland Kitchen  9:34 AM Beatriz Chancellor, [name]

## 2019-04-02 NOTE — Progress Notes (Signed)
PROGRESS NOTE    Joan [name]  [id number] DOB: 1960/08/28 DOA: 03/30/2019 PCP: [name], MD   Brief Narrative:  Patient is a 59 year old female with history of COPD, on home O2 2 L at night, 2 to 3 L on exertion only during the day if needed, hypothyroidism, anxiety/bipolar disorder, diet controlled diabetes mellitus, hypertension, hyperlipidemia presented with right lower quadrant abdominal pain. Patient reported that pain has been intermittent for the last month or so however since yesterday, pain has become constant, severe, 10/ 10.  Reported nausea, dry heaving but no vomiting.  Denies any fevers or chills, coughing or any new shortness of breath.  No dysuria.  Patient is  s/p laparoscopic appendicectomy.  Tolerated well, she has passed flatus but vomited twice,   High chances of ileus.  Started on clear liquid diet,  will advance to full liquids as per general surgery.   Assessment & Plan:   Principal Problem:   Acute appendicitis Active Problems:   Anxiety   Essential hypertension   Chronic respiratory failure with hypoxia (HCC)   Dyslipidemia   Hypothyroidism due to acquired atrophy of thyroid   GERD without esophagitis   Hypertension   COPD (chronic obstructive pulmonary disease) (HCC)  # Abdominal pain secondary to acute nonruptured appendicitis -CT abdomen showed acute nonruptured appendicitis, - management per general surgery, primary service -Currently on IV antibiotics, placed on IV fluids -S/p laparoscopic appendicectomy, postop day 2 -She has passed flatus, but threw up ,  high chances of ileus. -Started clear liquids, advance to full liquids as per SX.    # Essential hypertension - Continue atenolol 25 mg daily and HCTZ 25 mg daily at home. - continue IV Lopressor 2.5 mg IV every 6 hours to avoid reflex tachycardia.  # History of COPD, chronic respiratory failure with hypoxia (HCC) -Currently no acute wheezing, continue O2 2 L, stable at  baseline.  Has quit smoking. -Incentive spirometry after surgery -Restarted Combivent inhaler, Dulera -Resume Daliresp once taking p.o.  # Diabetes mellitus: Diet controlled -Patient reports that she does not check her blood sugars at home and is not on any oral hypoglycemics -Monitor CBGs, placed on sliding scale insulin if elevated -will check hemoglobin A1c, last HbA1c 6.7 in 11/2018  # Dyslipidemia -Hold statin while n.p.o., resume Lipitor 20 mg daily once taking p.o. after surgery  # Hypothyroidism  -Change to IV Synthroid 12.5 MCG daily while awaiting NPO   # GERD without esophagitis -Placed on IV PPI (takes Protonix 40 mg daily at home)   # Anxiety/bipolar disorder -Currently holding Seroquel nightly while n.p.o.  [facility] medicine service will follow the patient, thank you for this consultation.  DVT prophylaxis:  Code Status:  Full Family Communication: Discussed with patient in detail. Disposition Plan: s/p Lap Appendectomy , anticipated discharge home in 1 to 2 days   Consultants:   None.  Procedures:  Lap. Appendectomy   Antimicrobials:  Anti-infectives (From admission, onward)   Start     Dose/Rate Route Frequency Ordered Stop   03/31/19 0500  ciprofloxacin (CIPRO) IVPB 400 mg     400 mg 200 mL/hr over 60 Minutes Intravenous Every 12 hours 03/30/19 2242     03/31/19 0200  metroNIDAZOLE (FLAGYL) IVPB 500 mg     500 mg 100 mL/hr over 60 Minutes Intravenous Every 8 hours 03/30/19 2242     03/30/19 1630  ciprofloxacin (CIPRO) IVPB 400 mg     400 mg 200 mL/hr over 60 Minutes Intravenous  Once 03/30/19 1620 03/30/19 1914   03/30/19 1630  metroNIDAZOLE (FLAGYL) IVPB 500 mg     500 mg 100 mL/hr over 60 Minutes Intravenous  Once 03/30/19 1620 03/30/19 1914     Subjective: Patient was seen at bedside.  She reports her pain is better than before surgery.   She reports passing flatus but has threw up. Pain is better controlled.  Objective: Vitals:    04/01/19 2245 04/02/19 0633 04/02/19 0809 04/02/19 1025  BP: (!) 106/59 118/63  118/65  Pulse: 92 95  100  Resp: 16 20  18   Temp: 97.8 F (36.6 C) 98.4 F (36.9 C)  98.3 F (36.8 C)  TempSrc: Oral Oral  Oral  SpO2: (!) 86% 90% 93% 94%  Weight:      Height:        Intake/Output Summary (Last 24 hours) at 04/02/2019 1331 Last data filed at 04/02/2019 1000 Gross per 24 hour  Intake 2262.7 ml  Output 1865 ml  Net 397.7 ml   Filed Weights   03/30/19 1259 03/31/19 1135  Weight: 90.7 kg 90.7 kg    Examination:  General exam: Appears calm and comfortable  Respiratory system: Clear to auscultation. Respiratory effort normal. Cardiovascular system: S1 & S2 heard, RRR. No JVD, murmurs, rubs, gallops or clicks. No pedal edema. Gastrointestinal system: Abdomen is nondistended, soft , Mildly tender, scar noted.  Central nervous system: Alert and oriented. No focal neurological deficits. Extremities:  No swelling, No leg pain. Skin: No rashes, lesions or ulcers Psychiatry: Judgement and insight appear normal. Mood & affect appropriate.     Data Reviewed: I have personally reviewed following labs and imaging studies  CBC: Recent Labs  Lab 03/30/19 1336 03/31/19 0417 04/01/19 0346 04/02/19 0400  WBC 21.0* 18.4* 17.0* 11.7*  HGB 14.4 13.1 11.2* 10.5*  HCT 43.1 41.7 35.2* 32.9*  MCV 93.3 97.4 96.2 96.5  PLT 299 240 184 [id number]   Basic Metabolic Panel: Recent Labs  Lab 03/30/19 1336 03/31/19 0417 04/01/19 0346 04/02/19 0400  NA 136 136 136 138  K 3.8 3.9 3.6 3.3*  CL 96* 96* 99 101  CO2 26 24 29 29   GLUCOSE 142* 120* 140* 119*  BUN 14 17 11 9   CREATININE 1.06* 1.23* 1.02* 0.93  CALCIUM 9.7 9.1 8.4* 8.6*   GFR: Estimated Creatinine Clearance: 75.8 mL/min (by C-G formula based on SCr of 0.93 mg/dL). Liver Function Tests: Recent Labs  Lab 03/30/19 1336 03/31/19 0417  AST 25 20  ALT 31 28  ALKPHOS 135* 125  BILITOT 0.9 1.2  PROT 7.4 7.6  ALBUMIN 4.2 4.3   Recent  Labs  Lab 03/30/19 1336  LIPASE 21   No results for input(s): AMMONIA in the last 168 hours. Coagulation Profile: No results for input(s): INR, PROTIME in the last 168 hours. Cardiac Enzymes: No results for input(s): CKTOTAL, CKMB, CKMBINDEX, TROPONINI in the last 168 hours. BNP (last 3 results) No results for input(s): PROBNP in the last 8760 hours. HbA1C: Recent Labs    03/31/19 0417  HGBA1C 6.2*   CBG: Recent Labs  Lab 04/01/19 2349 04/02/19 0441 04/02/19 0744 04/02/19 0904 04/02/19 1144  GLUCAP 127* 102* 140* 155* 145*   Lipid Profile: No results for input(s): CHOL, HDL, LDLCALC, TRIG, CHOLHDL, LDLDIRECT in the last 72 hours. Thyroid Function Tests: Recent Labs    03/30/19 1915  TSH 1.156   Anemia Panel: No results for input(s): VITAMINB12, FOLATE, FERRITIN, TIBC, IRON, RETICCTPCT in the last 72 hours. Sepsis  Labs: No results for input(s): PROCALCITON, LATICACIDVEN in the last 168 hours.  Recent Results (from the past 240 hour(s))  Respiratory Panel by RT PCR (Flu A&B, Covid) - Nasopharyngeal Swab     Status: None   Collection Time: 03/30/19  5:01 PM   Specimen: Nasopharyngeal Swab  Result Value Ref Range Status   SARS Coronavirus 2 by RT PCR NEGATIVE NEGATIVE Final    Comment: (NOTE) SARS-CoV-2 target nucleic acids are NOT DETECTED. The SARS-CoV-2 RNA is generally detectable in upper respiratoy specimens during the acute phase of infection. The lowest concentration of SARS-CoV-2 viral copies this assay can detect is 131 copies/mL. A negative result does not preclude SARS-Cov-2 infection and should not be used as the sole basis for treatment or other patient management decisions. A negative result may occur with  improper specimen collection/handling, submission of specimen other than nasopharyngeal swab, presence of viral mutation(s) within the areas targeted by this assay, and inadequate number of viral copies (<131 copies/mL). A negative result must be  combined with clinical observations, patient history, and epidemiological information. The expected result is Negative. Fact Sheet for Patients:  PinkCheek.be Fact Sheet for Healthcare Providers:  GravelBags.it This test is not yet ap proved or cleared by the [place] FDA and  has been authorized for detection and/or diagnosis of SARS-CoV-2 by FDA under an Emergency Use Authorization (EUA). This EUA will remain  in effect (meaning this test can be used) for the duration of the COVID-19 declaration under Section 564(b)(1) of the Act, 21 U.S.C. section 360bbb-3(b)(1), unless the authorization is terminated or revoked sooner.    Influenza A by PCR NEGATIVE NEGATIVE Final   Influenza B by PCR NEGATIVE NEGATIVE Final    Comment: (NOTE) The Xpert Xpress SARS-CoV-2/FLU/RSV assay is intended as an aid in  the diagnosis of influenza from Nasopharyngeal swab specimens and  should not be used as a sole basis for treatment. Nasal washings and  aspirates are unacceptable for Xpert Xpress SARS-CoV-2/FLU/RSV  testing. Fact Sheet for Patients: PinkCheek.be Fact Sheet for Healthcare Providers: GravelBags.it This test is not yet approved or cleared by the [place] FDA and  has been authorized for detection and/or diagnosis of SARS-CoV-2 by  FDA under an Emergency Use Authorization (EUA). This EUA will remain  in effect (meaning this test can be used) for the duration of the  Covid-19 declaration under Section 564(b)(1) of the Act, 21  U.S.C. section 360bbb-3(b)(1), unless the authorization is  terminated or revoked. Performed at [facility], [place] [address] [place], [place] [address]      Radiology Studies: No results found.   Scheduled Meds: . acetaminophen  1,000 mg Oral Q8H  . gabapentin  300 mg Oral BID  . insulin aspart  0-9 Units  Subcutaneous Q4H  . ipratropium-albuterol  3 mL Nebulization BID  . levothyroxine  25 mcg Oral Q0600  . metoprolol tartrate  2.5 mg Intravenous Q6H  . mometasone-formoterol  2 puff Inhalation BID  . nicotine  14 mg Transdermal Daily  . pantoprazole  40 mg Oral Daily  . potassium chloride  40 mEq Oral Once  . QUEtiapine  50 mg Oral BID  . sodium chloride flush  3 mL Intravenous Once  . traZODone  100 mg Oral QHS  . venlafaxine XR  150 mg Oral Q breakfast   Continuous Infusions: . sodium chloride 75 mL/hr at 04/01/19 2310  . ciprofloxacin 400 mg (04/02/19 0432)  . metronidazole 500 mg (04/02/19 1100)  LOS: 3 days    Time spent: 25 mins.    Shawna Clamp, MD [facility]   If 7PM-7AM, please contact night-coverage

## 2019-04-02 NOTE — Progress Notes (Signed)
Physical Therapy Treatment Patient Details Name: Joan [name] MRN: [id number] DOB: 07-05-1960 Today's Date: 04/02/2019    History of Present Illness Patient is 59 y.o. female s/p laproscopic appendectomy on 03/31/19 with PMH significant for HTN, hypothyroidism, GERD, COPD, DM, chronic pain, anxiety, depression, and OA. She also had a recent Lt THR on 01/26/19 and continues to be on posterior hip precautions.    PT Comments    Pt adamantly declines OOB d/t generalized fatigue, pain.  Agreeable to bed level exercises. Will continue efforts.   Follow Up Recommendations  Home health PT;Supervision for mobility/OOB     Equipment Recommendations  None recommended by PT    Recommendations for Other Services       Precautions / Restrictions Precautions Precautions: Fall;Posterior Hip Precaution Comments: reviewed posterior THP, pt states "I know them!" but cannot recall any (0/3) Restrictions Weight Bearing Restrictions: No    Mobility  Bed Mobility               General bed mobility comments: pt declined d/t fatigue and generalized pain  Transfers                    Ambulation/Gait                 Stairs             Wheelchair Mobility    Modified Rankin (Stroke Patients Only)       Balance                                            Cognition Arousal/Alertness: Awake/alert;Suspect due to medications(sleepy) Behavior During Therapy: (easily agitated) Overall Cognitive Status: No family/caregiver present to determine baseline cognitive functioning                                        Exercises Total Joint Exercises Ankle Circles/Pumps: AROM;Both;Supine;15 reps Quad Sets: AROM;Both;10 reps;Supine Gluteal Sets: AROM;Both;10 reps Short Arc Quad: AROM;Left;10 reps;Supine Heel Slides: AAROM;Left;10 reps;Supine Hip ABduction/ADduction: AAROM;Left;10 reps;Supine    General Comments        Pertinent  Vitals/Pain Pain Assessment: Faces Faces Pain Scale: Hurts whole lot Pain Location: general Pain Descriptors / Indicators: Sore;Guarding;Grimacing Pain Intervention(s): Limited activity within patient's tolerance;Monitored during session    Home Living                      Prior Function            PT Goals (current goals can now be found in the care plan section) Acute Rehab PT Goals Patient Stated Goal: to get home again PT Goal Formulation: With patient Time For Goal Achievement: 04/15/19 Potential to Achieve Goals: Good Progress towards PT goals: Progressing toward goals    Frequency    Min 3X/week      PT Plan Current plan remains appropriate    Co-evaluation              AM-PAC PT "6 Clicks" Mobility   Outcome Measure  Help needed turning from your back to your side while in a flat bed without using bedrails?: A Little Help needed moving from lying on your back to sitting on the side of a flat bed without using bedrails?: A Little Help  needed moving to and from a bed to a chair (including a wheelchair)?: A Little Help needed standing up from a chair using your arms (e.g., wheelchair or bedside chair)?: A Little Help needed to walk in hospital room?: A Little Help needed climbing 3-5 steps with a railing? : A Lot 6 Click Score: 17    End of Session   Activity Tolerance: Patient limited by fatigue;Patient limited by pain Patient left: in bed;with call bell/phone within reach;with bed alarm set   PT Visit Diagnosis: Unsteadiness on feet (R26.81);Muscle weakness (generalized) (M62.81);Difficulty in walking, not elsewhere classified (R26.2)     Time: [id number] PT Time Calculation (min) (ACUTE ONLY): 17 min  Charges:  $Therapeutic Exercise: 8-22 mins                     Baxter Flattery, PT   Acute Rehab Dept [facility]): [id number]   04/02/2019    [facility] 04/02/2019, 12:38 PM

## 2019-04-02 NOTE — Progress Notes (Signed)
[facility]/Trauma Progress Note  2 Days Post-Op   Assessment/Plan Severe COPDon home O2 Severe medical comorbidities. Appreciate medical assistance in managing these.  Acute gangrenous appendicitis, perforation - S/P lap appy, JP drain, Dr. Rosendo Gros, 02/03 - await return of bowel function, high risk for ileus - ambulate, IS - Low threshold for CT scan postop day 4-5 if not improved since she is high risk for abscess - ambulate - Mag pending, replace K Ileus - bilious emesis this am. + Flatus after. If she vomits again she will need NGT  FEN:NPO, ice chips, NGT if vomits again VTE: SCD's, lovenox [id number] IV Cipro &Flagyl 02/02>>WBC down to 11.7 afebrile Foley:none Follow up:Dr. Rosendo Gros     LOS: 3 days    Subjective: CC: N & V this am  Pt states copious green emesis this am. She had flatus after emesis. No BM. Continue abdominal pain. She just does not feel good.  Objective: Vital signs in last 24 hours: Temp:  [97.8 F (36.6 C)-98.4 F (36.9 C)] 98.3 F (36.8 C) (02/05 1025) Pulse Rate:  [90-100] 100 (02/05 1025) Resp:  [16-20] 18 (02/05 1025) BP: (106-118)/(59-73) 118/65 (02/05 1025) SpO2:  [86 %-100 %] 94 % (02/05 1025) Last BM Date: 03/29/19  Intake/Output from previous day: 02/04 0701 - 02/05 0700 In: 2182.6 [P.O.:432; I.V.:1448.6; IV Piggyback:302] Out: [id number] [Urine:1150; Drains:195] Intake/Output this shift: Total I/O In: -  Out: 800 [Emesis/NG output:800]  PE:  Gen: Alert, NAD Card: RRR, no M/G/R heard Pulm: CTA, no W/R/R, rate andeffort normal, [place] O2 Abd: Soft, ND, hypoactive BS,TTP RLQ with guarding, no peritonitis, incisions with glue intact are well appearing and are without drainage. Drain is SS Skin: no rashes noted, warm and dry   Anti-infectives: Anti-infectives (From admission, onward)   Start     Dose/Rate Route Frequency Ordered Stop   03/31/19 0500  ciprofloxacin (CIPRO) IVPB 400 mg     400  mg 200 mL/hr over 60 Minutes Intravenous Every 12 hours 03/30/19 2242     03/31/19 0200  metroNIDAZOLE (FLAGYL) IVPB 500 mg     500 mg 100 mL/hr over 60 Minutes Intravenous Every 8 hours 03/30/19 2242     03/30/19 1630  ciprofloxacin (CIPRO) IVPB 400 mg     400 mg 200 mL/hr over 60 Minutes Intravenous  Once 03/30/19 1620 03/30/19 1914   03/30/19 1630  metroNIDAZOLE (FLAGYL) IVPB 500 mg     500 mg 100 mL/hr over 60 Minutes Intravenous  Once 03/30/19 1620 03/30/19 1914      Lab Results:  Recent Labs    04/01/19 0346 04/02/19 0400  WBC 17.0* 11.7*  HGB 11.2* 10.5*  HCT 35.2* 32.9*  PLT 184 187   BMET Recent Labs    04/01/19 0346 04/02/19 0400  NA 136 138  K 3.6 3.3*  CL 99 101  CO2 29 29  GLUCOSE 140* 119*  BUN 11 9  CREATININE 1.02* 0.93  CALCIUM 8.4* 8.6*   PT/INR No results for input(s): LABPROT, INR in the last 72 hours. CMP     Component Value Date/Time   NA 138 04/02/2019 0400   NA 140 12/09/2018 1133   K 3.3 (L) 04/02/2019 0400   CL 101 04/02/2019 0400   CO2 29 04/02/2019 0400   GLUCOSE 119 (H) 04/02/2019 0400   BUN 9 04/02/2019 0400   BUN 18 12/09/2018 1133   CREATININE 0.93 04/02/2019 0400   CALCIUM 8.6 (L) 04/02/2019 0400   PROT 7.6 03/31/2019 0417  PROT 6.5 08/19/2017 0822   ALBUMIN 4.3 03/31/2019 0417   ALBUMIN 4.5 08/19/2017 0822   AST 20 03/31/2019 0417   ALT 28 03/31/2019 0417   ALKPHOS 125 03/31/2019 0417   BILITOT 1.2 03/31/2019 0417   BILITOT 0.3 08/19/2017 0822   GFRNONAA >60 04/02/2019 0400   GFRAA >60 04/02/2019 0400   Lipase     Component Value Date/Time   LIPASE 21 03/30/2019 1336    Studies/Results: No results found.   Kalman Drape, PA-C [facility] Please see amion for pager for the following: Myna Hidalgo, W, & Friday 7:00am - 4:30pm Thursdays 7:00am -11:30am

## 2019-04-02 NOTE — TOC Initial Note (Signed)
Transition of Care [facility]) - Initial/Assessment Note    Patient Details  Name: Joan [name] MRN: [id number] Date of Birth: 23-Sep-1960  [facility] CM/SW Contact:    Lia Hopping, [place] Phone Number: 04/02/2019, 4:09 PM  Clinical Narrative:                 CSW met with patient at bedside to discuss Home Health options. Patient lives in the home with her brother/HCPOA [name]. Patient was  under [facility] for [facility] last year after hip surgery. Patient reports she still have the exercises. Patient reports she is agreeable to continue therapy. Patient reports she uses a rolling walker at home.  CSW explain patient insurance has changed therefore her [employer] plans is out of network with [facility].  CSW reached out to the [facility] in [place]. [facility] agreeable to provide patient therapy needs at discharge.    Update from Nurse: Patient had an NG tube placed.  TOC team will continue to follow this patient.   Expected Discharge Plan: [place] Barriers to Discharge: Continued Medical Work up   Patient Goals and CMS Choice Patient states their goals for this hospitalization and ongoing recovery are:: "I can do therapy at home, I have a sheet of excerises that I do at home from the last agency." CMS Medicare.gov Compare Post Acute Care list provided to:: Patient Choice offered to / list presented to : Patient  Expected Discharge Plan and Services Expected Discharge Plan: [place]   Discharge Planning Services: CM Consult Post Acute Care Choice: [place] arrangements for the past 2 months: Single Family Home                           HH Arranged: PT [place] Agency: ([facility]) Date [place]: 04/02/19 Time HH Agency Contacted: [id number]    Prior Living Arrangements/Services Living arrangements for the past 2 months: [place] with::  Self Patient language and need for interpreter reviewed:: No        Need for Family Participation in Patient Care: Yes (Comment) Care giver support system in place?: Yes (comment) Current home services: DME Criminal Activity/Legal Involvement Pertinent to Current Situation/Hospitalization: No - Comment as needed  Activities of Daily Living Home Assistive Devices/Equipment: Walker (specify type) ADL Screening (condition at time of admission) Patient's cognitive ability adequate to safely complete daily activities?: Yes Is the patient deaf or have difficulty hearing?: No Does the patient have difficulty seeing, even when wearing glasses/contacts?: No Does the patient have difficulty concentrating, remembering, or making decisions?: No Patient able to express need for assistance with ADLs?: Yes Does the patient have difficulty dressing or bathing?: Yes Independently performs ADLs?: Yes (appropriate for developmental age) Does the patient have difficulty walking or climbing stairs?: Yes Weakness of Legs: None Weakness of Arms/Hands: None  Permission Sought/Granted   Permission granted to share information with : Yes, Verbal Permission Granted     Permission granted to share info w AGENCY: [place] granted to share info w Relationship: Brother, HCPOA [name]     Emotional Assessment Appearance:: Appears stated age   Affect (typically observed): Accepting Orientation: : Oriented to Self, Oriented to Place, Oriented to Situation Alcohol / Substance Use: Not Applicable Psych Involvement: No (comment)  Admission diagnosis:  Acute appendicitis [K35.80] Acute appendicitis with localized peritonitis, without  perforation, abscess, or gangrene [K35.30] Patient Active Problem List   Diagnosis Date Noted  . Acute appendicitis 03/30/2019  . COPD (chronic obstructive pulmonary disease) ([place]) 03/30/2019  . Obese 01/27/2019  . S/P left TH revision 01/26/2019  . Neck pain  12/18/2018  . Chest pain of uncertain etiology 16/74/2552  . Hypertension   . Medication management 11/06/2018  . Acute on chronic respiratory failure with hypoxia ([place]) 05/21/2018  . Cough 03/25/2018  . Tobacco abuse 01/15/2018  . Hypomagnesemia 12/29/2017  . COPD exacerbation ([place]) 12/28/2017  . Insomnia 12/11/2017  . Pulmonary nodule 11/11/2017  . Carotid artery disease ([place]) 11/05/2017  . Hoarseness 09/09/2017  . OSA on CPAP 08/07/2017  . Family history of heart disease 07/18/2017  . GERD without esophagitis 06/23/2017  . H/O total hip arthroplasty, left 06/18/2017  . Hypothyroidism due to acquired atrophy of thyroid 04/05/2017  . Primary osteoarthritis involving multiple joints 04/05/2017  . Incarcerated ventral hernia 04/04/2017  . Chronic constipation 02/10/2017  . Iron deficiency anemia due to dietary causes 02/10/2017  . Osteoarthritis 02/10/2017  . Dyslipidemia 02/10/2017  . H/O total hip arthroplasty, right 02/05/2017  . Rash 01/08/2017  . Pre-operative clearance 12/16/2016  . Chronic respiratory failure with hypoxia ([place]) 12/16/2016  . Essential hypertension 11/29/2016  . Back pain 11/29/2016  . Vocal cord dysfunction   . COPD, severe ([place]) 09/24/2016  . Anxiety 09/24/2016  . Steroid-induced diabetes ([place]) 09/24/2016   PCP:  [name], MD Pharmacy:   [id number] [place] [id number]: [phone]: [phone] [place] [facility] [id number]: [phone]: [phone]    Social Determinants of Health ([place] Interventions    Readmission Risk Interventions No flowsheet data found.

## 2019-04-02 NOTE — Progress Notes (Signed)
Pt. Unable to wear CPAP at this time second to placement of NG tube.

## 2019-04-02 NOTE — Progress Notes (Signed)
Patient pulled NG tube out about 10 inches; reinserted and another CXR done. Joan [name] called and orders noted for ativan. Son here and updated. Donne Hazel, RN

## 2019-04-03 ENCOUNTER — Inpatient Hospital Stay (HOSPITAL_COMMUNITY): Payer: Medicare HMO

## 2019-04-03 DIAGNOSIS — K352 Acute appendicitis with generalized peritonitis, without abscess: Secondary | ICD-10-CM | POA: Diagnosis not present

## 2019-04-03 DIAGNOSIS — Z4682 Encounter for fitting and adjustment of non-vascular catheter: Secondary | ICD-10-CM | POA: Diagnosis not present

## 2019-04-03 DIAGNOSIS — J449 Chronic obstructive pulmonary disease, unspecified: Secondary | ICD-10-CM | POA: Diagnosis not present

## 2019-04-03 DIAGNOSIS — R69 Illness, unspecified: Secondary | ICD-10-CM | POA: Diagnosis not present

## 2019-04-03 DIAGNOSIS — J9611 Chronic respiratory failure with hypoxia: Secondary | ICD-10-CM | POA: Diagnosis not present

## 2019-04-03 DIAGNOSIS — E785 Hyperlipidemia, unspecified: Secondary | ICD-10-CM | POA: Diagnosis not present

## 2019-04-03 LAB — GLUCOSE, CAPILLARY
Glucose-Capillary: 102 mg/dL — ABNORMAL HIGH (ref 70–99)
Glucose-Capillary: 109 mg/dL — ABNORMAL HIGH (ref 70–99)
Glucose-Capillary: 110 mg/dL — ABNORMAL HIGH (ref 70–99)
Glucose-Capillary: 114 mg/dL — ABNORMAL HIGH (ref 70–99)
Glucose-Capillary: 160 mg/dL — ABNORMAL HIGH (ref 70–99)
Glucose-Capillary: 78 mg/dL (ref 70–99)

## 2019-04-03 MED ORDER — POTASSIUM CHLORIDE 10 MEQ/100ML IV SOLN
10.0000 meq | INTRAVENOUS | Status: AC
  Administered 2019-04-03 (×3): 10 meq via INTRAVENOUS
  Filled 2019-04-03 (×2): qty 100

## 2019-04-03 NOTE — Progress Notes (Signed)
Physical Therapy Treatment Patient Details Name: Joan [name] MRN: [id number] DOB: 08-16-1960 Today's Date: 04/03/2019    History of Present Illness Patient is 59 y.o. female s/p laproscopic appendectomy on 03/31/19 with PMH significant for HTN, hypothyroidism, GERD, COPD, DM, chronic pain, anxiety, depression, and OA. She also had a recent Lt THR on 01/26/19 and continues to be on posterior hip precautions.    PT Comments    POD # 3 General Comments: OxA x 4 familiar from prior hip surgeries, anxious, impatient Assisted OOB to amb required MAX encouragement.  General bed mobility comments: required increased assist due to ABD pain and distention.  General transfer comment: 25% VC's on proper hand placement as with stand to sit to controlo descend.  General Gait Details: Remained on 2 lts nasal avg 94% and HR 109.  Increased c/o ABD pain with activity.  Increased c/o of "belching" and "gas"    Follow Up Recommendations  Home health PT;Supervision for mobility/OOB     Equipment Recommendations  None recommended by PT    Recommendations for Other Services       Precautions / Restrictions Precautions Precautions: Fall;Posterior Hip Precaution Comments: THRev post 12/20, Home Os 2 lts at night Restrictions Weight Bearing Restrictions: No    Mobility  Bed Mobility Overal bed mobility: Needs Assistance Bed Mobility: Supine to Sit     Supine to sit: Mod assist     General bed mobility comments: required increased assist due to ABD pain and distention  Transfers Overall transfer level: Needs assistance Equipment used: Rolling walker (2 wheeled) Transfers: Sit to/from Stand           General transfer comment: 25% VC's on proper hand placement as with stand to sit to controlo desend  Ambulation/Gait Ambulation/Gait assistance: Min Gaffer (Feet): 72 Feet Assistive device: Rolling walker (2 wheeled) Gait Pattern/deviations: Step-through  pattern;Decreased stride length;Drifts right/left;Trunk flexed Gait velocity: decreased   General Gait Details: Remained on 2 lts nasal avg 94% and HR 109.  Increased c/o ABD pain with activity.  Increased c/o of "belching" and "gas"   Stairs             Wheelchair Mobility    Modified Rankin (Stroke Patients Only)       Balance                                            Cognition Arousal/Alertness: Awake/alert;Suspect due to medications   Overall Cognitive Status: No family/caregiver present to determine baseline cognitive functioning                                 General Comments: OxA x 4 familiar from prior hip surgeries, anxious, impatient      Exercises      General Comments        Pertinent Vitals/Pain Pain Assessment: Faces Faces Pain Scale: Hurts little more Pain Location: ABD Pain Descriptors / Indicators: Sore;Guarding;Grimacing Pain Intervention(s): Monitored during session    Home Living                      Prior Function            PT Goals (current goals can now be found in the care plan section) Progress towards PT goals: Progressing toward goals  Frequency    Min 3X/week      PT Plan Current plan remains appropriate    Co-evaluation              AM-PAC PT "6 Clicks" Mobility   Outcome Measure  Help needed turning from your back to your side while in a flat bed without using bedrails?: A Little Help needed moving from lying on your back to sitting on the side of a flat bed without using bedrails?: A Little Help needed moving to and from a bed to a chair (including a wheelchair)?: A Little Help needed standing up from a chair using your arms (e.g., wheelchair or bedside chair)?: A Little Help needed to walk in hospital room?: A Little Help needed climbing 3-5 steps with a railing? : A Lot 6 Click Score: 17    End of Session Equipment Utilized During Treatment: Gait  belt Activity Tolerance: Patient limited by fatigue;Patient limited by pain Patient left: in chair;with chair alarm set;with call bell/phone within reach Nurse Communication: Mobility status PT Visit Diagnosis: Unsteadiness on feet (R26.81);Muscle weakness (generalized) (M62.81);Difficulty in walking, not elsewhere classified (R26.2)     Time: [id number] PT Time Calculation (min) (ACUTE ONLY): 25 min  Charges:  $Gait Training: 8-22 mins $Therapeutic Activity: 8-22 mins                     Rica Koyanagi  PTA Acute  Rehabilitation Services Pager      [phone] Office      [phone]

## 2019-04-03 NOTE — Progress Notes (Signed)
Patient up to chair once this shift. Refuses further movement/ ambulation. Patient denies pain. Ng tube retaped; educated pt on how ambulation will help her progress but still refuses. Educated family members as well. Call bell within reach, bed alarm on. Will continue to monitor closely.

## 2019-04-03 NOTE — Progress Notes (Addendum)
PROGRESS NOTE  SHELAGH BROUSE [id number] DOB: [birth year] DOA: 03/30/2019 PCP: [name], MD  HPI/Recap of past 24 hours: Per admitting [id number] is a 59 year old female with history of COPD, on home O2 2 L at night, 2 to 3 L on exertion only during the day if needed, hypothyroidism, anxiety/bipolar disorder, diet controlled diabetes mellitus, hypertension, hyperlipidemia presented with right lower quadrant abdominal pain. Patient reported that pain has been intermittent for the last month or so however since yesterday, pain has become constant, severe, 10/10. Reported nausea, dry heaving but no vomiting. Denies any fevers or chills, coughing or any new shortness of breath. No dysuria.  Patient is  s/p laparoscopic appendicectomy.  Tolerated well, she has passed flatus but vomited twice,   High chances of ileus.  Started on clear liquid diet,  will advance to full liquids as per general surgery. Primary attending is surgery   Subjective: Seen and examined at bedside still complaining of has not made a bowel movement and abdominal discomfort.  She has passed small amount of gas however  Assessment/Plan: Principal Problem:   Acute appendicitis Active Problems:   Anxiety   Essential hypertension   Chronic respiratory failure with hypoxia (HCC)   Dyslipidemia   Hypothyroidism due to acquired atrophy of thyroid   GERD without esophagitis   Hypertension   COPD (chronic obstructive pulmonary disease) ([place])  #1 acute appendicitis status post laparoscopic appendectomy 3 days ago with questionable perforation patient is currently n.p.o. with NG tube drainage.  Surgery recommended medical management.  She has passed gas but has not made any bowel movements yet continue IV antibiotics  2.  Severe COPD with chronic respiratory failure with hypoxia currently on 2 L/min and stable at baseline.  Continue incentive spirometry restart Combivent and Dulera as well as other inhalers.   3.  Essential hypertension continue IV Lopressor 2.5 mg every 6 hours since patient is n.p.o.  4.  Diabetes mellitus continue monitoring with sliding scale coverage  5.  Anxiety/bipolar disorder currently holding SeroqueL.  6.  Hypothyroidism due to being n.p.o. she was changed to IV stick Synthroid 12.5 mg daily  7.  Dyslipidemia statin is on hold due to n.p.o.  8.  GERD with esophagitis.  Continue IV PPI   Code Status: Full  Severity of Illness: The appropriate patient status for this patient is INPATIENT. Inpatient status is judged to be reasonable and necessary in order to provide the required intensity of service to ensure the patient's safety. The patient's presenting symptoms, physical exam findings, and initial radiographic and laboratory data in the context of their chronic comorbidities is felt to place them at high risk for further clinical deterioration. Furthermore, it is not anticipated that the patient will be medically stable for discharge from the hospital within 2 midnights of admission. The following factors support the patient status of inpatient.   Small bowel obstruction with NG tube   * I certify that at the point of admission it is my clinical judgment that the patient will require inpatient hospital care spanning beyond 2 midnights from the point of admission due to high intensity of service, high risk for further deterioration and high frequency of surveillance required.*    Family Communication: None at bedside  Disposition Plan: Home when stable   Consultants:  Surgery  Procedures:  None  Antimicrobials:  Metronidazole  Cipro   DVT prophylaxis: SCD   Objective: Vitals:   04/02/19 2114 04/02/19 2341 04/03/19 0518 04/03/19 0716  BP:  128/78 132/78 136/84   Pulse: (!) 110 100 94   Resp: 20  18   Temp: 99 F (37.2 C)  99.6 F (37.6 C)   TempSrc: Oral  Oral   SpO2: 100% 100% 98% 98%  Weight:      Height:        Intake/Output  Summary (Last 24 hours) at 04/03/2019 0958 Last data filed at 04/03/2019 [id number] Gross per 24 hour  Intake 3014.43 ml  Output 4495 ml  Net -1480.57 ml   Filed Weights   03/30/19 1259 03/31/19 1135  Weight: 90.7 kg 90.7 kg   Body mass index is 32.27 kg/m.  Exam:  . General: 59 y.o. year-old female well developed well nourished in no acute distress.  Alert and oriented x3. . Cardiovascular: Regular rate and rhythm with no rubs or gallops.  No thyromegaly or JVD noted.   Marland Kitchen Respiratory: Clear to auscultation with no wheezes or rales. Good inspiratory effort. . Abdomen: Soft nontender nondistended with normal bowel sounds x4 quadrants. . Musculoskeletal: No lower extremity edema. 2/4 pulses in all 4 extremities. . Skin: No ulcerative lesions noted or rashes, . Psychiatry: Mood is appropriate for condition and setting    Data Reviewed: CBC: Recent Labs  Lab 03/30/19 1336 03/31/19 0417 04/01/19 0346 04/02/19 0400  WBC 21.0* 18.4* 17.0* 11.7*  HGB 14.4 13.1 11.2* 10.5*  HCT 43.1 41.7 35.2* 32.9*  MCV 93.3 97.4 96.2 96.5  PLT 299 240 184 [id number]   Basic Metabolic Panel: Recent Labs  Lab 03/30/19 1336 03/31/19 0417 04/01/19 0346 04/02/19 0400  NA 136 136 136 138  K 3.8 3.9 3.6 3.3*  CL 96* 96* 99 101  CO2 26 24 29 29   GLUCOSE 142* 120* 140* 119*  BUN 14 17 11 9   CREATININE 1.06* 1.23* 1.02* 0.93  CALCIUM 9.7 9.1 8.4* 8.6*   GFR: Estimated Creatinine Clearance: 75.8 mL/min (by C-G formula based on SCr of 0.93 mg/dL). Liver Function Tests: Recent Labs  Lab 03/30/19 1336 03/31/19 0417  AST 25 20  ALT 31 28  ALKPHOS 135* 125  BILITOT 0.9 1.2  PROT 7.4 7.6  ALBUMIN 4.2 4.3   Recent Labs  Lab 03/30/19 1336  LIPASE 21   No results for input(s): AMMONIA in the last 168 hours. Coagulation Profile: No results for input(s): INR, PROTIME in the last 168 hours. Cardiac Enzymes: No results for input(s): CKTOTAL, CKMB, CKMBINDEX, TROPONINI in the last 168 hours. BNP (last  3 results) No results for input(s): PROBNP in the last 8760 hours. HbA1C: No results for input(s): HGBA1C in the last 72 hours. CBG: Recent Labs  Lab 04/02/19 1714 04/02/19 2015 04/02/19 2339 04/03/19 0406 04/03/19 0743  GLUCAP 130* 95 103* 109* 160*   Lipid Profile: No results for input(s): CHOL, HDL, LDLCALC, TRIG, CHOLHDL, LDLDIRECT in the last 72 hours. Thyroid Function Tests: No results for input(s): TSH, T4TOTAL, FREET4, T3FREE, THYROIDAB in the last 72 hours. Anemia Panel: No results for input(s): VITAMINB12, FOLATE, FERRITIN, TIBC, IRON, RETICCTPCT in the last 72 hours. Urine analysis:    Component Value Date/Time   COLORURINE YELLOW 03/30/2019 1838   APPEARANCEUR CLEAR 03/30/2019 1838   LABSPEC 1.045 (H) 03/30/2019 1838   PHURINE 7.0 03/30/2019 1838   GLUCOSEU NEGATIVE 03/30/2019 1838   GLUCOSEU NEGATIVE 08/05/2017 1652   HGBUR SMALL (A) 03/30/2019 1838   BILIRUBINUR NEGATIVE 03/30/2019 1838   KETONESUR NEGATIVE 03/30/2019 1838   PROTEINUR NEGATIVE 03/30/2019 1838   UROBILINOGEN 0.2 08/05/2017 1652  NITRITE NEGATIVE 03/30/2019 1838   LEUKOCYTESUR NEGATIVE 03/30/2019 1838   Sepsis Labs: @LABRCNTIP (procalcitonin:4,lacticidven:4)  ) Recent Results (from the past 240 hour(s))  Respiratory Panel by RT PCR (Flu A&B, Covid) - Nasopharyngeal Swab     Status: None   Collection Time: 03/30/19  5:01 PM   Specimen: Nasopharyngeal Swab  Result Value Ref Range Status   SARS Coronavirus 2 by RT PCR NEGATIVE NEGATIVE Final    Comment: (NOTE) SARS-CoV-2 target nucleic acids are NOT DETECTED. The SARS-CoV-2 RNA is generally detectable in upper respiratoy specimens during the acute phase of infection. The lowest concentration of SARS-CoV-2 viral copies this assay can detect is 131 copies/mL. A negative result does not preclude SARS-Cov-2 infection and should not be used as the sole basis for treatment or other patient management decisions. A negative result may occur with   improper specimen collection/handling, submission of specimen other than nasopharyngeal swab, presence of viral mutation(s) within the areas targeted by this assay, and inadequate number of viral copies (<131 copies/mL). A negative result must be combined with clinical observations, patient history, and epidemiological information. The expected result is Negative. Fact Sheet for Patients:  PinkCheek.be Fact Sheet for Healthcare Providers:  GravelBags.it This test is not yet ap proved or cleared by the [place] FDA and  has been authorized for detection and/or diagnosis of SARS-CoV-2 by FDA under an Emergency Use Authorization (EUA). This EUA will remain  in effect (meaning this test can be used) for the duration of the COVID-19 declaration under Section 564(b)(1) of the Act, 21 U.S.C. section 360bbb-3(b)(1), unless the authorization is terminated or revoked sooner.    Influenza A by PCR NEGATIVE NEGATIVE Final   Influenza B by PCR NEGATIVE NEGATIVE Final    Comment: (NOTE) The Xpert Xpress SARS-CoV-2/FLU/RSV assay is intended as an aid in  the diagnosis of influenza from Nasopharyngeal swab specimens and  should not be used as a sole basis for treatment. Nasal washings and  aspirates are unacceptable for Xpert Xpress SARS-CoV-2/FLU/RSV  testing. Fact Sheet for Patients: PinkCheek.be Fact Sheet for Healthcare Providers: GravelBags.it This test is not yet approved or cleared by the [place] FDA and  has been authorized for detection and/or diagnosis of SARS-CoV-2 by  FDA under an Emergency Use Authorization (EUA). This EUA will remain  in effect (meaning this test can be used) for the duration of the  Covid-19 declaration under Section 564(b)(1) of the Act, 21  U.S.C. section 360bbb-3(b)(1), unless the authorization is  terminated or revoked. Performed at  [facility], [place] [address] [place], [place] [address]       Studies: DG Abd Portable 1V  Result Date: 04/02/2019 CLINICAL DATA:  Nasogastric tube placement. EXAM: PORTABLE ABDOMEN - 1 VIEW COMPARISON:  None. FINDINGS: A nasogastric tube is seen with its distal and overlying the body of the stomach. Multiple dilated small bowel loops are seen within the upper abdomen. No radio-opaque calculi or other significant radiographic abnormality are seen. IMPRESSION: Nasogastric tube positioning, as described above, with additional findings consistent with a small bowel obstruction versus ileus. Electronically Signed   By: Virgina Norfolk M.D.   On: 04/02/2019 16:31    Scheduled Meds: . acetaminophen  1,000 mg Oral Q8H  . gabapentin  300 mg Oral BID  . insulin aspart  0-9 Units Subcutaneous Q4H  . ipratropium-albuterol  3 mL Nebulization BID  . levothyroxine  25 mcg Oral Q0600  . metoprolol tartrate  2.5 mg Intravenous Q6H  .  mometasone-formoterol  2 puff Inhalation BID  . nicotine  14 mg Transdermal Daily  . pantoprazole  40 mg Oral Daily  . potassium chloride  40 mEq Oral Once  . QUEtiapine  50 mg Oral BID  . sodium chloride flush  3 mL Intravenous Once  . traZODone  100 mg Oral QHS  . venlafaxine XR  150 mg Oral Q breakfast    Continuous Infusions: . sodium chloride 75 mL/hr at 04/01/19 2310  . ciprofloxacin 400 mg (04/03/19 0646)  . metronidazole 500 mg (04/03/19 0408)     LOS: 4 days     Cristal Deer, MD [facility]  To reach me or the doctor on call, go to: [email or web address] Password [facility]  04/03/2019, 9:58 AM

## 2019-04-03 NOTE — Progress Notes (Signed)
3 Days Post-Op   Subjective/Chief Complaint: Sleepy says stomach hurts    Objective: Vital signs in last 24 hours: Temp:  [98.1 F (36.7 C)-99.6 F (37.6 C)] 99.6 F (37.6 C) (02/06 0518) Pulse Rate:  [83-110] 94 (02/06 0518) Resp:  [16-20] 18 (02/06 0518) BP: (128-136)/(76-84) 136/84 (02/06 0518) SpO2:  [92 %-100 %] 98 % (02/06 0716) Last BM Date: 04/02/19  Intake/Output from previous day: 02/05 0701 - 02/06 0700 In: 1683.9 [P.O.:780; I.V.:753.8; IV Piggyback:150] Out: 4495 [Urine:800; Emesis/NG output:3500; Drains:195] Intake/Output this shift: Total I/O In: 1330.6 [I.V.:980.6; IV Piggyback:350] Out: 110 [Emesis/NG output:100; Drains:10]   Gen: Alert, NAD Card: RRR, no M/G/R heard Pulm: CTA, no W/R/R, rate andeffort normal, [place] O2 Abd: Soft, ND,hypoactive BS,TTP RLQ with guarding, no peritonitis, incisions with glue intact are well appearing and are without drainage. Drain is SS Skin: no rashes noted, warm and dry  Lab Results:  Recent Labs    04/01/19 0346 04/02/19 0400  WBC 17.0* 11.7*  HGB 11.2* 10.5*  HCT 35.2* 32.9*  PLT 184 187   BMET Recent Labs    04/01/19 0346 04/02/19 0400  NA 136 138  K 3.6 3.3*  CL 99 101  CO2 29 29  GLUCOSE 140* 119*  BUN 11 9  CREATININE 1.02* 0.93  CALCIUM 8.4* 8.6*   PT/INR No results for input(s): LABPROT, INR in the last 72 hours. ABG No results for input(s): PHART, HCO3 in the last 72 hours.  Invalid input(s): PCO2, PO2  Studies/Results: DG Abd Portable 1V  Result Date: 04/02/2019 CLINICAL DATA:  Nasogastric tube placement. EXAM: PORTABLE ABDOMEN - 1 VIEW COMPARISON:  None. FINDINGS: A nasogastric tube is seen with its distal and overlying the body of the stomach. Multiple dilated small bowel loops are seen within the upper abdomen. No radio-opaque calculi or other significant radiographic abnormality are seen. IMPRESSION: Nasogastric tube positioning, as described above, with additional findings  consistent with a small bowel obstruction versus ileus. Electronically Signed   By: Virgina Norfolk M.D.   On: 04/02/2019 16:31    Anti-infectives: Anti-infectives (From admission, onward)   Start     Dose/Rate Route Frequency Ordered Stop   03/31/19 0500  ciprofloxacin (CIPRO) IVPB 400 mg     400 mg 200 mL/hr over 60 Minutes Intravenous Every 12 hours 03/30/19 2242     03/31/19 0200  metroNIDAZOLE (FLAGYL) IVPB 500 mg     500 mg 100 mL/hr over 60 Minutes Intravenous Every 8 hours 03/30/19 2242     03/30/19 1630  ciprofloxacin (CIPRO) IVPB 400 mg     400 mg 200 mL/hr over 60 Minutes Intravenous  Once 03/30/19 1620 03/30/19 1914   03/30/19 1630  metroNIDAZOLE (FLAGYL) IVPB 500 mg     500 mg 100 mL/hr over 60 Minutes Intravenous  Once 03/30/19 1620 03/30/19 1914      Assessment/Plan: s/p Procedure(s): APPENDECTOMY LAPAROSCOPIC (N/A) Severe COPDon home O2 Severe medical comorbidities. Appreciate medical assistance in managing these.  Acute gangrenous appendicitis, perforation - S/P lap appy, JP drain, Dr. Rosendo Gros, 02/03 - await return of bowel function -ileus - ambulate, IS - Low threshold for CT scan postop day 4-5 if not improved since she is high risk for abscess - ambulate - Mag pending, replace K check AM labs  Ileus - bilious emesis this am. + Flatus after. If she vomits again she will need NGT  FEN:NPO, ice chips, NGT if vomits again VTE: SCD's, lovenox [id number] IV Cipro &Flagyl 02/02>>WBC down to 11.7afebrile  Foley:none Follow up:Dr. Rosendo Gros  LOS: 4 days    Joyice Faster Elizet Kaplan 04/03/2019

## 2019-04-03 NOTE — Plan of Care (Signed)

## 2019-04-03 NOTE — Progress Notes (Signed)
Pt. Unable to wear CPAP at this time, second to NG tube in place.  Will monitor and be available as needed.

## 2019-04-04 DIAGNOSIS — E785 Hyperlipidemia, unspecified: Secondary | ICD-10-CM

## 2019-04-04 LAB — CBC
HCT: 33.8 % — ABNORMAL LOW (ref 36.0–46.0)
Hemoglobin: 10.8 g/dL — ABNORMAL LOW (ref 12.0–15.0)
MCH: 30.5 pg (ref 26.0–34.0)
MCHC: 32 g/dL (ref 30.0–36.0)
MCV: 95.5 fL (ref 80.0–100.0)
Platelets: 276 10*3/uL (ref 150–400)
RBC: 3.54 MIL/uL — ABNORMAL LOW (ref 3.87–5.11)
RDW: 13.8 % (ref 11.5–15.5)
WBC: 7.3 10*3/uL (ref 4.0–10.5)
nRBC: 0 % (ref 0.0–0.2)

## 2019-04-04 LAB — GLUCOSE, CAPILLARY
Glucose-Capillary: 100 mg/dL — ABNORMAL HIGH (ref 70–99)
Glucose-Capillary: 105 mg/dL — ABNORMAL HIGH (ref 70–99)
Glucose-Capillary: 127 mg/dL — ABNORMAL HIGH (ref 70–99)
Glucose-Capillary: 132 mg/dL — ABNORMAL HIGH (ref 70–99)
Glucose-Capillary: 86 mg/dL (ref 70–99)
Glucose-Capillary: 93 mg/dL (ref 70–99)
Glucose-Capillary: 98 mg/dL (ref 70–99)

## 2019-04-04 LAB — BASIC METABOLIC PANEL
Anion gap: 8 (ref 5–15)
BUN: 12 mg/dL (ref 6–20)
CO2: 31 mmol/L (ref 22–32)
Calcium: 8.1 mg/dL — ABNORMAL LOW (ref 8.9–10.3)
Chloride: 103 mmol/L (ref 98–111)
Creatinine, Ser: 0.74 mg/dL (ref 0.44–1.00)
GFR calc Af Amer: >60 mL/min (ref >60)
GFR calc non Af Amer: >60 mL/min (ref >60)
Glucose, Bld: 124 mg/dL — ABNORMAL HIGH (ref 70–99)
Potassium: 2.6 mmol/L — CL (ref 3.5–5.1)
Sodium: 142 mmol/L (ref 135–145)

## 2019-04-04 MED ORDER — POTASSIUM CHLORIDE 10 MEQ/100ML IV SOLN
10.0000 meq | INTRAVENOUS | Status: AC
  Administered 2019-04-04 (×4): 10 meq via INTRAVENOUS
  Filled 2019-04-04: qty 100

## 2019-04-04 NOTE — Progress Notes (Signed)
Joan [name] pt daughter called and requested for students not to work with her mother. Charge nurse Geoffry Paradise notified.

## 2019-04-04 NOTE — Progress Notes (Signed)
Pt. With c.o N&V, therefore, CPAP will be held per patients request.  Will be available if patient changes her mind.

## 2019-04-04 NOTE — Progress Notes (Signed)
Pt pulled out NG tube. Pt acknowledges she pulled it out and states " I pulled it out, but I dont know it came out." [place] notified and stated to wait until they are finished rounding. Bed alarm on, pt in no distress. Will continue to monitor.

## 2019-04-04 NOTE — Progress Notes (Signed)
Critical lab value of potassium 2.6 MD notified.

## 2019-04-04 NOTE — Progress Notes (Signed)
Physical Therapy Treatment Patient Details Name: Joan [name] MRN: [id number] DOB: May 08, 1960 Today's Date: 04/04/2019    History of Present Illness Patient is 59 y.o. female s/p laproscopic appendectomy on 03/31/19 with PMH significant for HTN, hypothyroidism, GERD, COPD, DM, chronic pain, anxiety, depression, and OA. She also had a recent Lt THR on 01/26/19 and continues to be on posterior hip precautions.    PT Comments    Pt progressing with PT. incr amb distance today with lots of encouragement  Follow Up Recommendations  Home health PT;Supervision for mobility/OOB     Equipment Recommendations  None recommended by PT    Recommendations for Other Services       Precautions / Restrictions Precautions Precautions: Fall;Posterior Hip Precaution Comments: THRev post 12/20, Home Os 2 lts at night Restrictions Weight Bearing Restrictions: No    Mobility  Bed Mobility Overal bed mobility: Needs Assistance Bed Mobility: Rolling;Supine to Sit Rolling: Min guard         General bed mobility comments: partial roll, and assist to elevate trunk  Transfers Overall transfer level: Needs assistance Equipment used: Rolling walker (2 wheeled) Transfers: Sit to/from Stand Sit to Stand: Min guard         General transfer comment: cues for hand placement   Ambulation/Gait Ambulation/Gait assistance: Min guard;Supervision Gait Distance (Feet): 120 Feet Assistive device: Rolling walker (2 wheeled) Gait Pattern/deviations: Step-through pattern;Decreased stride length;Drifts right/left;Trunk flexed Gait velocity: decreased   General Gait Details: cues for posture, Rw position, breathing and to self monitor fatigue level; pt on 2-3L O2 with SpO2=93% during amb   Stairs             Wheelchair Mobility    Modified Rankin (Stroke Patients Only)       Balance   Sitting-balance support: Feet supported;No upper extremity supported Sitting balance-Leahy Scale: Good      Standing balance support: During functional activity Standing balance-Leahy Scale: Fair                              Cognition Arousal/Alertness: Awake/alert Behavior During Therapy: WFL for tasks assessed/performed Overall Cognitive Status: Within Functional Limits for tasks assessed                                        Exercises      General Comments        Pertinent Vitals/Pain Pain Assessment: Faces Faces Pain Scale: Hurts a little bit Pain Location: ABD Pain Descriptors / Indicators: Sore;Guarding;Grimacing Pain Intervention(s): Limited activity within patient's tolerance;Monitored during session;Repositioned    Home Living                      Prior Function            PT Goals (current goals can now be found in the care plan section) Acute Rehab PT Goals Patient Stated Goal: to get home again PT Goal Formulation: With patient Time For Goal Achievement: 04/15/19 Potential to Achieve Goals: Good Progress towards PT goals: Progressing toward goals    Frequency    Min 3X/week      PT Plan Current plan remains appropriate    Co-evaluation              AM-PAC PT "6 Clicks" Mobility   Outcome Measure  Help needed turning from your  back to your side while in a flat bed without using bedrails?: A Little Help needed moving from lying on your back to sitting on the side of a flat bed without using bedrails?: A Little Help needed moving to and from a bed to a chair (including a wheelchair)?: A Little Help needed standing up from a chair using your arms (e.g., wheelchair or bedside chair)?: A Little Help needed to walk in hospital room?: A Little Help needed climbing 3-5 steps with a railing? : A Little 6 Click Score: 18    End of Session Equipment Utilized During Treatment: Oxygen Activity Tolerance: Patient tolerated treatment well Patient left: in chair;with chair alarm set;with call bell/phone within  reach Nurse Communication: Mobility status PT Visit Diagnosis: Unsteadiness on feet (R26.81);Muscle weakness (generalized) (M62.81);Difficulty in walking, not elsewhere classified (R26.2)     Time: 4098-1191 PT Time Calculation (min) (ACUTE ONLY): 35 min  Charges:  $Gait Training: 23-37 mins                     Trianna Lupien, PT   Acute Rehab Dept [facility]): [phone]   04/04/2019    [facility] 04/04/2019, 12:05 PM

## 2019-04-04 NOTE — Progress Notes (Signed)
PROGRESS NOTE  Joan [name] [id number] DOB: Jun 02, 1960 DOA: 03/30/2019 PCP: [name], MD  HPI/Recap of past 24 hours: Per admitting Joan [name] is a 59 year old female with history of COPD, on home O2 2 L at night, 2 to 3 L on exertion only during the day if needed, hypothyroidism, anxiety/bipolar disorder, diet controlled diabetes mellitus, hypertension, hyperlipidemia presented with right lower quadrant abdominal pain. Patient reported that pain has been intermittent for the last month or so however since yesterday, pain has become constant, severe, 10/10. Reported nausea, dry heaving but no vomiting. Denies any fevers or chills, coughing or any new shortness of breath. No dysuria.  Patient is  s/p laparoscopic appendicectomy.  Tolerated well, she has passed flatus but vomited twice,   High chances of ileus.  Started on clear liquid diet,  will advance to full liquids as per general surgery. Primary attending is surgery   Subjective: Seen and examined at bedside still complaining of has not made a bowel movement and abdominal discomfort.  She has passed small amount of gas however.  April 04, 2019. Subjective: Patient seen and examined at bedside.  Patient is doing much better today.  Nurse reported patient pulled out her NG tube by mistake and GI stated to leave it out and not to replace.  She finally made her bowel movements she still has a little abdominal pain though she has been started on clear liquid by surgery  Assessment/Plan: Principal Problem:   Acute appendicitis Active Problems:   Anxiety   Essential hypertension   Chronic respiratory failure with hypoxia (HCC)   Dyslipidemia   Hypothyroidism due to acquired atrophy of thyroid   GERD without esophagitis   Hypertension   COPD (chronic obstructive pulmonary disease) ([place])  #1 acute appendicitis status post laparoscopic appendectomy 3 days ago with questionable perforation patient is currently n.p.o.  with NG tube drainage.  Surgery recommended medical management.  She has passed gas but has not made any bowel movements yet continue IV antibiotics  2.  Severe COPD with chronic respiratory failure with hypoxia currently on 2 L/min and stable at baseline.  Continue incentive spirometry restart Combivent and Dulera as well as other inhalers.  3.  Essential hypertension continue IV Lopressor 2.5 mg every 6 hours since patient is n.p.o.  4.  Diabetes mellitus continue monitoring with sliding scale coverage  5.  Anxiety/bipolar disorder currently holding SeroqueL.  6.  Hypothyroidism due to being n.p.o. she was changed to IV stick Synthroid 12.5 mg daily  7.  Dyslipidemia statin is on hold due to n.p.o.  8.  GERD with esophagitis.  Continue IV PPI   Code Status: Full  Severity of Illness: The appropriate patient status for this patient is INPATIENT. Inpatient status is judged to be reasonable and necessary in order to provide the required intensity of service to ensure the patient's safety. The patient's presenting symptoms, physical exam findings, and initial radiographic and laboratory data in the context of their chronic comorbidities is felt to place them at high risk for further clinical deterioration. Furthermore, it is not anticipated that the patient will be medically stable for discharge from the hospital within 2 midnights of admission. The following factors support the patient status of inpatient.   Small bowel obstruction with NG tube   * I certify that at the point of admission it is my clinical judgment that the patient will require inpatient hospital care spanning beyond 2 midnights from the point of admission due to high  intensity of service, high risk for further deterioration and high frequency of surveillance required.*    Family Communication: None at bedside  Disposition Plan: Home when stable   Consultants:  Surgery  Procedures:  None  Antimicrobials:   Metronidazole  Cipro   DVT prophylaxis: SCD   Objective: Vitals:   04/03/19 2126 04/04/19 0105 04/04/19 0520 04/04/19 0912  BP: 133/78 (!) 146/86 119/71   Pulse: 93 94 81   Resp: 18  16   Temp: 98.3 F (36.8 C)  98 F (36.7 C)   TempSrc: Oral  Oral   SpO2: 97%  93% 99%  Weight:      Height:        Intake/Output Summary (Last 24 hours) at 04/04/2019 1004 Last data filed at 04/04/2019 1002 Gross per 24 hour  Intake 2167.24 ml  Output 2305 ml  Net -137.76 ml   Filed Weights   03/30/19 1259 03/31/19 1135  Weight: 90.7 kg 90.7 kg   Body mass index is 32.27 kg/m.  Exam:  . General: 59 y.o. year-old female well developed well nourished in no acute distress.  Alert and oriented x3. . Cardiovascular: Regular rate and rhythm with no rubs or gallops.  No thyromegaly or JVD noted.   Marland Kitchen Respiratory: Clear to auscultation with no wheezes or rales. Good inspiratory effort. . Abdomen: Soft nontender nondistended with normal bowel sounds x4 quadrants. . Musculoskeletal: No lower extremity edema. 2/4 pulses in all 4 extremities. . Skin: No ulcerative lesions noted or rashes, . Psychiatry: Mood is appropriate for condition and setting    Data Reviewed: CBC: Recent Labs  Lab 03/30/19 1336 03/31/19 0417 04/01/19 0346 04/02/19 0400 04/04/19 0832  WBC 21.0* 18.4* 17.0* 11.7* 7.3  HGB 14.4 13.1 11.2* 10.5* 10.8*  HCT 43.1 41.7 35.2* 32.9* 33.8*  MCV 93.3 97.4 96.2 96.5 95.5  PLT 299 240 184 187 [id number]   Basic Metabolic Panel: Recent Labs  Lab 03/30/19 1336 03/31/19 0417 04/01/19 0346 04/02/19 0400  NA 136 136 136 138  K 3.8 3.9 3.6 3.3*  CL 96* 96* 99 101  CO2 26 24 29 29   GLUCOSE 142* 120* 140* 119*  BUN 14 17 11 9   CREATININE 1.06* 1.23* 1.02* 0.93  CALCIUM 9.7 9.1 8.4* 8.6*   GFR: Estimated Creatinine Clearance: 75.8 mL/min (by C-G formula based on SCr of 0.93 mg/dL). Liver Function Tests: Recent Labs  Lab 03/30/19 1336 03/31/19 0417  AST 25 20  ALT 31 28   ALKPHOS 135* 125  BILITOT 0.9 1.2  PROT 7.4 7.6  ALBUMIN 4.2 4.3   Recent Labs  Lab 03/30/19 1336  LIPASE 21   No results for input(s): AMMONIA in the last 168 hours. Coagulation Profile: No results for input(s): INR, PROTIME in the last 168 hours. Cardiac Enzymes: No results for input(s): CKTOTAL, CKMB, CKMBINDEX, TROPONINI in the last 168 hours. BNP (last 3 results) No results for input(s): PROBNP in the last 8760 hours. HbA1C: No results for input(s): HGBA1C in the last 72 hours. CBG: Recent Labs  Lab 04/03/19 1613 04/03/19 1952 04/03/19 2330 04/04/19 0359 04/04/19 0755  GLUCAP 110* 114* 78 105* 132*   Lipid Profile: No results for input(s): CHOL, HDL, LDLCALC, TRIG, CHOLHDL, LDLDIRECT in the last 72 hours. Thyroid Function Tests: No results for input(s): TSH, T4TOTAL, FREET4, T3FREE, THYROIDAB in the last 72 hours. Anemia Panel: No results for input(s): VITAMINB12, FOLATE, FERRITIN, TIBC, IRON, RETICCTPCT in the last 72 hours. Urine analysis:    Component Value  Date/Time   COLORURINE YELLOW 03/30/2019 1838   APPEARANCEUR CLEAR 03/30/2019 1838   LABSPEC 1.045 (H) 03/30/2019 1838   PHURINE 7.0 03/30/2019 1838   GLUCOSEU NEGATIVE 03/30/2019 1838   GLUCOSEU NEGATIVE 08/05/2017 1652   HGBUR SMALL (A) 03/30/2019 1838   BILIRUBINUR NEGATIVE 03/30/2019 1838   KETONESUR NEGATIVE 03/30/2019 1838   PROTEINUR NEGATIVE 03/30/2019 1838   UROBILINOGEN 0.2 08/05/2017 1652   NITRITE NEGATIVE 03/30/2019 1838   LEUKOCYTESUR NEGATIVE 03/30/2019 1838   Sepsis Labs: @LABRCNTIP (procalcitonin:4,lacticidven:4)  ) Recent Results (from the past 240 hour(s))  Respiratory Panel by RT PCR (Flu A&B, Covid) - Nasopharyngeal Swab     Status: None   Collection Time: 03/30/19  5:01 PM   Specimen: Nasopharyngeal Swab  Result Value Ref Range Status   SARS Coronavirus 2 by RT PCR NEGATIVE NEGATIVE Final    Comment: (NOTE) SARS-CoV-2 target nucleic acids are NOT DETECTED. The SARS-CoV-2  RNA is generally detectable in upper respiratoy specimens during the acute phase of infection. The lowest concentration of SARS-CoV-2 viral copies this assay can detect is 131 copies/mL. A negative result does not preclude SARS-Cov-2 infection and should not be used as the sole basis for treatment or other patient management decisions. A negative result may occur with  improper specimen collection/handling, submission of specimen other than nasopharyngeal swab, presence of viral mutation(s) within the areas targeted by this assay, and inadequate number of viral copies (<131 copies/mL). A negative result must be combined with clinical observations, patient history, and epidemiological information. The expected result is Negative. Fact Sheet for Patients:  PinkCheek.be Fact Sheet for Healthcare Providers:  GravelBags.it This test is not yet ap proved or cleared by the [place] FDA and  has been authorized for detection and/or diagnosis of SARS-CoV-2 by FDA under an Emergency Use Authorization (EUA). This EUA will remain  in effect (meaning this test can be used) for the duration of the COVID-19 declaration under Section 564(b)(1) of the Act, 21 U.S.C. section 360bbb-3(b)(1), unless the authorization is terminated or revoked sooner.    Influenza A by PCR NEGATIVE NEGATIVE Final   Influenza B by PCR NEGATIVE NEGATIVE Final    Comment: (NOTE) The Xpert Xpress SARS-CoV-2/FLU/RSV assay is intended as an aid in  the diagnosis of influenza from Nasopharyngeal swab specimens and  should not be used as a sole basis for treatment. Nasal washings and  aspirates are unacceptable for Xpert Xpress SARS-CoV-2/FLU/RSV  testing. Fact Sheet for Patients: PinkCheek.be Fact Sheet for Healthcare Providers: GravelBags.it This test is not yet approved or cleared by the [place] FDA  and  has been authorized for detection and/or diagnosis of SARS-CoV-2 by  FDA under an Emergency Use Authorization (EUA). This EUA will remain  in effect (meaning this test can be used) for the duration of the  Covid-19 declaration under Section 564(b)(1) of the Act, 21  U.S.C. section 360bbb-3(b)(1), unless the authorization is  terminated or revoked. Performed at [facility], [place] [address], [place], [place] [address]       Studies: No results found.  Scheduled Meds: . acetaminophen  1,000 mg Oral Q8H  . gabapentin  300 mg Oral BID  . insulin aspart  0-9 Units Subcutaneous Q4H  . ipratropium-albuterol  3 mL Nebulization BID  . levothyroxine  25 mcg Oral Q0600  . metoprolol tartrate  2.5 mg Intravenous Q6H  . mometasone-formoterol  2 puff Inhalation BID  . nicotine  14 mg Transdermal Daily  . pantoprazole  40 mg  Oral Daily  . potassium chloride  40 mEq Oral Once  . QUEtiapine  50 mg Oral BID  . sodium chloride flush  3 mL Intravenous Once  . traZODone  100 mg Oral QHS  . venlafaxine XR  150 mg Oral Q breakfast    Continuous Infusions: . sodium chloride 75 mL/hr at 04/01/19 2310  . ciprofloxacin 400 mg (04/04/19 0556)  . metronidazole 500 mg (04/04/19 0906)     LOS: 5 days     Cristal Deer, MD [facility]  To reach me or the doctor on call, go to: [email or web address] Password [email or web address]  04/04/2019, 10:04 AM

## 2019-04-04 NOTE — Progress Notes (Signed)
4 Days Post-Op   Subjective/Chief Complaint: wants something to drink  Pt more awake today Very angry about her care and did not understand reasoning behind initial medical management of her perforated appendix  Spent 20 minutes discussing her care and answering questions  Reviewed recommendations and her elevated risk of complications giver her COPD and medical problems Explained if medical management failed the next was surgery as occurred  Post operative course was expected given her presentation, condition and and findings on CT  Pulled her NGT out this am Not vomiting  Wants to try something to drink    Objective: Vital signs in last 24 hours: Temp:  [98 F (36.7 C)-98.6 F (37 C)] 98 F (36.7 C) (02/07 0520) Pulse Rate:  [81-96] 81 (02/07 0520) Resp:  [16-18] 16 (02/07 0520) BP: (119-146)/(71-86) 119/71 (02/07 0520) SpO2:  [93 %-99 %] 99 % (02/07 0912) Last BM Date: 04/02/19  Intake/Output from previous day: 02/06 0701 - 02/07 0700 In: 1945.7 [P.O.:240; I.V.:1232.6; IV Piggyback:473.1] Out: 2405 [Urine:500; Emesis/NG output:1875; Drains:30] Intake/Output this shift: Total I/O In: 1579.6 [I.V.:1179.6; IV Piggyback:400] Out: 10 [Drains:10]  General appearance: alert Resp: WOB minimally labored no wheezing  Cardio: NSR Incision/Wound:port sites clean JP serous minimal output   Lab Results:  Recent Labs    04/02/19 0400 04/04/19 0832  WBC 11.7* 7.3  HGB 10.5* 10.8*  HCT 32.9* 33.8*  PLT 187 276   BMET Recent Labs    04/02/19 0400 04/04/19 0832  NA 138 142  K 3.3* 2.6*  CL 101 103  CO2 29 31  GLUCOSE 119* 124*  BUN 9 12  CREATININE 0.93 0.74  CALCIUM 8.6* 8.1*   PT/INR No results for input(s): LABPROT, INR in the last 72 hours. ABG No results for input(s): PHART, HCO3 in the last 72 hours.  Invalid input(s): PCO2, PO2  Studies/Results: DG Abd Portable 1V  Result Date: 04/03/2019 CLINICAL DATA:  Bedside nasogastric tube placement. Postop day 3  laparoscopic appendectomy. EXAM: PORTABLE ABDOMEN - 1 VIEW COMPARISON:  04/02/2019 and earlier. FINDINGS: Nasogastric tube tip projects over the expected location of the mid body of a J-shaped stomach. Progressive distension of multiple small bowel loops in the upper abdomen since yesterday. Gas within a normal caliber cecum and moderate stool within the visualized descending colon. IMPRESSION: 1. Nasogastric tube tip projects over the mid body of the stomach. 2. Developing/worsening postoperative ileus or small bowel obstruction. Electronically Signed   By: Evangeline Dakin M.D.   On: 04/03/2019 11:40   DG Abd Portable 1V  Result Date: 04/02/2019 CLINICAL DATA:  Nasogastric tube placement. EXAM: PORTABLE ABDOMEN - 1 VIEW COMPARISON:  None. FINDINGS: A nasogastric tube is seen with its distal and overlying the body of the stomach. Multiple dilated small bowel loops are seen within the upper abdomen. No radio-opaque calculi or other significant radiographic abnormality are seen. IMPRESSION: Nasogastric tube positioning, as described above, with additional findings consistent with a small bowel obstruction versus ileus. Electronically Signed   By: Virgina Norfolk M.D.   On: 04/02/2019 16:31    Anti-infectives: Anti-infectives (From admission, onward)   Start     Dose/Rate Route Frequency Ordered Stop   03/31/19 0500  ciprofloxacin (CIPRO) IVPB 400 mg     400 mg 200 mL/hr over 60 Minutes Intravenous Every 12 hours 03/30/19 2242     03/31/19 0200  metroNIDAZOLE (FLAGYL) IVPB 500 mg     500 mg 100 mL/hr over 60 Minutes Intravenous Every 8 hours 03/30/19 2242  03/30/19 1630  ciprofloxacin (CIPRO) IVPB 400 mg     400 mg 200 mL/hr over 60 Minutes Intravenous  Once 03/30/19 1620 03/30/19 1914   03/30/19 1630  metroNIDAZOLE (FLAGYL) IVPB 500 mg     500 mg 100 mL/hr over 60 Minutes Intravenous  Once 03/30/19 1620 03/30/19 1914      Assessment/Plan: s/p Procedure(s): APPENDECTOMY LAPAROSCOPIC  (N/A) Advance diet Continue ABX  OOB today  Continue home meds   Replace K for hypokalemia   LOS: 5 days    Turner Daniels MD 04/04/2019

## 2019-04-04 NOTE — Progress Notes (Signed)
Pt refusing incentive spiromerty and mobility, despite continued education given. Pt states "I will walk when I feel better and get the tube out of my nose." Will continue to monitor and educate.

## 2019-04-05 ENCOUNTER — Inpatient Hospital Stay (HOSPITAL_COMMUNITY): Payer: Medicare HMO

## 2019-04-05 DIAGNOSIS — K56609 Unspecified intestinal obstruction, unspecified as to partial versus complete obstruction: Secondary | ICD-10-CM | POA: Diagnosis not present

## 2019-04-05 DIAGNOSIS — K352 Acute appendicitis with generalized peritonitis, without abscess: Secondary | ICD-10-CM | POA: Diagnosis not present

## 2019-04-05 LAB — CBC
HCT: 33.4 % — ABNORMAL LOW (ref 36.0–46.0)
Hemoglobin: 10.6 g/dL — ABNORMAL LOW (ref 12.0–15.0)
MCH: 30.6 pg (ref 26.0–34.0)
MCHC: 31.7 g/dL (ref 30.0–36.0)
MCV: 96.5 fL (ref 80.0–100.0)
Platelets: 275 10*3/uL (ref 150–400)
RBC: 3.46 MIL/uL — ABNORMAL LOW (ref 3.87–5.11)
RDW: 14 % (ref 11.5–15.5)
WBC: 8.2 10*3/uL (ref 4.0–10.5)
nRBC: 0 % (ref 0.0–0.2)

## 2019-04-05 LAB — GLUCOSE, CAPILLARY
Glucose-Capillary: 102 mg/dL — ABNORMAL HIGH (ref 70–99)
Glucose-Capillary: 124 mg/dL — ABNORMAL HIGH (ref 70–99)
Glucose-Capillary: 122 mg/dL — ABNORMAL HIGH (ref 70–99)
Glucose-Capillary: 109 mg/dL — ABNORMAL HIGH (ref 70–99)
Glucose-Capillary: 113 mg/dL — ABNORMAL HIGH (ref 70–99)
Glucose-Capillary: 124 mg/dL — ABNORMAL HIGH (ref 70–99)

## 2019-04-05 LAB — BASIC METABOLIC PANEL
Anion gap: 9 (ref 5–15)
BUN: 11 mg/dL (ref 6–20)
CO2: 30 mmol/L (ref 22–32)
Calcium: 8.2 mg/dL — ABNORMAL LOW (ref 8.9–10.3)
Chloride: 103 mmol/L (ref 98–111)
Creatinine, Ser: 0.8 mg/dL (ref 0.44–1.00)
GFR calc Af Amer: >60 mL/min (ref >60)
GFR calc non Af Amer: >60 mL/min (ref >60)
Glucose, Bld: 128 mg/dL — ABNORMAL HIGH (ref 70–99)
Potassium: 3 mmol/L — ABNORMAL LOW (ref 3.5–5.1)
Sodium: 142 mmol/L (ref 135–145)

## 2019-04-05 LAB — MAGNESIUM: Magnesium: 2.1 mg/dL (ref 1.7–2.4)

## 2019-04-05 MED ORDER — BIOTENE DRY MOUTH MT LIQD
15.0000 mL | OROMUCOSAL | Status: DC | PRN
  Administered 2019-04-05: 15 mL via OROMUCOSAL

## 2019-04-05 MED ORDER — PANTOPRAZOLE SODIUM 40 MG IV SOLR
40.0000 mg | Freq: Two times a day (BID) | INTRAVENOUS | Status: DC
  Administered 2019-04-05 – 2019-04-07 (×4): 40 mg via INTRAVENOUS
  Filled 2019-04-05 (×4): qty 40

## 2019-04-05 MED ORDER — ENOXAPARIN SODIUM 40 MG/0.4ML ~~LOC~~ SOLN
40.0000 mg | Freq: Every day | SUBCUTANEOUS | Status: DC
  Administered 2019-04-05 – 2019-04-14 (×10): 40 mg via SUBCUTANEOUS
  Filled 2019-04-05 (×10): qty 0.4

## 2019-04-05 MED ORDER — POTASSIUM CHLORIDE 10 MEQ/100ML IV SOLN
10.0000 meq | INTRAVENOUS | Status: AC
  Administered 2019-04-05 (×4): 10 meq via INTRAVENOUS
  Filled 2019-04-05 (×4): qty 100

## 2019-04-05 MED ORDER — PANTOPRAZOLE SODIUM 40 MG PO TBEC: 40.0000 mg | DELAYED_RELEASE_TABLET | Freq: Two times a day (BID) | ORAL | Status: DC

## 2019-04-05 NOTE — Progress Notes (Signed)
PROGRESS NOTE    Joan [name]  [id number] DOB: [birth year] DOA: 03/30/2019 PCP: [name], MD   Brief Narrative: 59 year old with past medical history significant of COPD on home oxygen 2 L at night and 2 to 3 L on exertion during the day if needed, hypothyroidism, anxiety bipolar disorder diet controlled diabetes, hypertension, hyperlipidemia who presented with right lower quadrant abdominal pain.  Patient reports that the pain has been intermittent for the last month or so however since the day prior to admission pain became constant severe 10 out of 10.  CT abdomen showed acute nonrupture appendicitis.   Assessment & Plan:   Principal Problem:   Acute appendicitis Active Problems:   Anxiety   Essential hypertension   Chronic respiratory failure with hypoxia (HCC)   Dyslipidemia   Hypothyroidism due to acquired atrophy of thyroid   GERD without esophagitis   Hypertension   COPD (chronic obstructive pulmonary disease) ([place])   1-appendicitis: The hospital started on IV antibiotics.  Made NPO.  Subsequently she underwent appendectomy. On ciprofloxacin and Flagyl. Complaining of nausea today.  Plan to get a KUB.  2-hypertension: On IV metoprolol.  Change to oral when patient is not having significant nausea  COPD, chronic respiratory failure with hypoxemia Continue with Combivent and Dulera.  Diabetes mellitus: Diet controlled  Dyslipidemia: Resume Lipitor when taking oral  Hypothyroidism continue with Synthroid  GERD: Resume PPI Anxiety bipolar disorder: Continue with Seroquel Hypokalemia; replete IV Estimated body mass index is 32.27 kg/m as calculated from the following:   Height as of this encounter: 5\' 6"  (1.676 m).   Weight as of this encounter: 90.7 kg.   DVT prophylaxis: Lovenox Code Status: Full code Family Communication: Care discussed with patient Disposition Plan:  Patient is from: Home Anticipated d/c date: 2 to 3 days when tolerating  diet Barriers to d/c or necessity for inpatient status: Patient is still with poor oral intake not tolerating diet, complaining of nausea today.  Consultants:   Surgery team is primary  Procedures:   Appendectomy  Antimicrobials:  Ciprofloxacin and Flagyl  Subjective: He denies chest pain, shortness of breath.  She is reporting abdominal pain, nausea  Objective: Vitals:   04/04/19 2037 04/05/19 0553 04/05/19 0728 04/05/19 0800  BP: 129/75 120/74    Pulse: 84 63    Resp: 18 18    Temp: (!) 97.5 F (36.4 C) 98.3 F (36.8 C)    TempSrc: Oral Oral    SpO2: 99% 98% 93% 93%  Weight:      Height:        Intake/Output Summary (Last 24 hours) at 04/05/2019 1358 Last data filed at 04/05/2019 1140 Gross per 24 hour  Intake 2480.29 ml  Output 921 ml  Net 1559.29 ml   Filed Weights   03/30/19 1259 03/31/19 1135  Weight: 90.7 kg 90.7 kg    Examination:  General exam: Appears calm and comfortable  Respiratory system: Clear to auscultation. Respiratory effort normal. Cardiovascular system: S1 & S2 heard, RRR. No JVD, murmurs, rubs, gallops or clicks. No pedal edema. Gastrointestinal system: Abdomen is nondistended, soft, mild tender, drain in lower quadrant.  Central nervous system: Alert and oriented. No focal neurological deficits. Extremities: Symmetric 5 x 5 power. Skin: No rashes, lesions or ulcers    Data Reviewed: I have personally reviewed following labs and imaging studies  CBC: Recent Labs  Lab 03/31/19 0417 04/01/19 0346 04/02/19 0400 04/04/19 0832 04/05/19 0421  WBC 18.4* 17.0* 11.7* 7.3 8.2  HGB 13.1 11.2* 10.5* 10.8* 10.6*  HCT 41.7 35.2* 32.9* 33.8* 33.4*  MCV 97.4 96.2 96.5 95.5 96.5  PLT 240 184 187 276 [id number]   Basic Metabolic Panel: Recent Labs  Lab 03/31/19 0417 04/01/19 0346 04/02/19 0400 04/04/19 0832 04/05/19 0421  NA 136 136 138 142 142  K 3.9 3.6 3.3* 2.6* 3.0*  CL 96* 99 101 103 103  CO2 24 29 29 31 30   GLUCOSE 120* 140* 119* 124*  128*  BUN 17 11 9 12 11   CREATININE 1.23* 1.02* 0.93 0.74 0.80  CALCIUM 9.1 8.4* 8.6* 8.1* 8.2*  MG  --   --   --   --  2.1   GFR: Estimated Creatinine Clearance: 88.1 mL/min (by C-G formula based on SCr of 0.8 mg/dL). Liver Function Tests: Recent Labs  Lab 03/30/19 1336 03/31/19 0417  AST 25 20  ALT 31 28  ALKPHOS 135* 125  BILITOT 0.9 1.2  PROT 7.4 7.6  ALBUMIN 4.2 4.3   Recent Labs  Lab 03/30/19 1336  LIPASE 21   No results for input(s): AMMONIA in the last 168 hours. Coagulation Profile: No results for input(s): INR, PROTIME in the last 168 hours. Cardiac Enzymes: No results for input(s): CKTOTAL, CKMB, CKMBINDEX, TROPONINI in the last 168 hours. BNP (last 3 results) No results for input(s): PROBNP in the last 8760 hours. HbA1C: No results for input(s): HGBA1C in the last 72 hours. CBG: Recent Labs  Lab 04/04/19 2107 04/04/19 2339 04/05/19 0346 04/05/19 0744 04/05/19 1243  GLUCAP 93 98 102* 124* 122*   Lipid Profile: No results for input(s): CHOL, HDL, LDLCALC, TRIG, CHOLHDL, LDLDIRECT in the last 72 hours. Thyroid Function Tests: No results for input(s): TSH, T4TOTAL, FREET4, T3FREE, THYROIDAB in the last 72 hours. Anemia Panel: No results for input(s): VITAMINB12, FOLATE, FERRITIN, TIBC, IRON, RETICCTPCT in the last 72 hours. Sepsis Labs: No results for input(s): PROCALCITON, LATICACIDVEN in the last 168 hours.  Recent Results (from the past 240 hour(s))  Respiratory Panel by RT PCR (Flu A&B, Covid) - Nasopharyngeal Swab     Status: None   Collection Time: 03/30/19  5:01 PM   Specimen: Nasopharyngeal Swab  Result Value Ref Range Status   SARS Coronavirus 2 by RT PCR NEGATIVE NEGATIVE Final    Comment: (NOTE) SARS-CoV-2 target nucleic acids are NOT DETECTED. The SARS-CoV-2 RNA is generally detectable in upper respiratoy specimens during the acute phase of infection. The lowest concentration of SARS-CoV-2 viral copies this assay can detect is 131  copies/mL. A negative result does not preclude SARS-Cov-2 infection and should not be used as the sole basis for treatment or other patient management decisions. A negative result may occur with  improper specimen collection/handling, submission of specimen other than nasopharyngeal swab, presence of viral mutation(s) within the areas targeted by this assay, and inadequate number of viral copies (<131 copies/mL). A negative result must be combined with clinical observations, patient history, and epidemiological information. The expected result is Negative. Fact Sheet for Patients:  PinkCheek.be Fact Sheet for Healthcare Providers:  GravelBags.it This test is not yet ap proved or cleared by the [place] FDA and  has been authorized for detection and/or diagnosis of SARS-CoV-2 by FDA under an Emergency Use Authorization (EUA). This EUA will remain  in effect (meaning this test can be used) for the duration of the COVID-19 declaration under Section 564(b)(1) of the Act, 21 U.S.C. section 360bbb-3(b)(1), unless the authorization is terminated or revoked sooner.    Influenza  A by PCR NEGATIVE NEGATIVE Final   Influenza B by PCR NEGATIVE NEGATIVE Final    Comment: (NOTE) The Xpert Xpress SARS-CoV-2/FLU/RSV assay is intended as an aid in  the diagnosis of influenza from Nasopharyngeal swab specimens and  should not be used as a sole basis for treatment. Nasal washings and  aspirates are unacceptable for Xpert Xpress SARS-CoV-2/FLU/RSV  testing. Fact Sheet for Patients: PinkCheek.be Fact Sheet for Healthcare Providers: GravelBags.it This test is not yet approved or cleared by the [place] FDA and  has been authorized for detection and/or diagnosis of SARS-CoV-2 by  FDA under an Emergency Use Authorization (EUA). This EUA will remain  in effect (meaning this test can  be used) for the duration of the  Covid-19 declaration under Section 564(b)(1) of the Act, 21  U.S.C. section 360bbb-3(b)(1), unless the authorization is  terminated or revoked. Performed at [facility], [place] [address], [place], [place] [address]          Radiology Studies: DG Abd Portable 1V  Result Date: 04/05/2019 CLINICAL DATA:  Follow up small bowel obstruction EXAM: PORTABLE ABDOMEN - 1 VIEW COMPARISON:  04/02/2018 FINDINGS: Scattered large and small bowel gas is noted. Persistent small bowel dilatation is noted similar to that seen on the prior exam. Gastric catheter has been removed in the interval. Postsurgical drain is seen in the right lower quadrant with a Jackson-Pratt bulb on the left. Changes of prior lumbar surgery and hip replacements bilaterally are noted. No other focal abnormality is seen. No free air is seen. IMPRESSION: Persistent small bowel dilatation consistent with at least partial small bowel obstruction. No free air is noted. Electronically Signed   By: Inez Catalina M.D.   On: 04/05/2019 11:08        Scheduled Meds: . acetaminophen  1,000 mg Oral Q8H  . enoxaparin (LOVENOX) injection  40 mg Subcutaneous Daily  . gabapentin  300 mg Oral BID  . insulin aspart  0-9 Units Subcutaneous Q4H  . ipratropium-albuterol  3 mL Nebulization BID  . levothyroxine  25 mcg Oral Q0600  . metoprolol tartrate  2.5 mg Intravenous Q6H  . mometasone-formoterol  2 puff Inhalation BID  . nicotine  14 mg Transdermal Daily  . pantoprazole  40 mg Oral BID  . QUEtiapine  50 mg Oral BID  . sodium chloride flush  3 mL Intravenous Once  . traZODone  100 mg Oral QHS  . venlafaxine XR  150 mg Oral Q breakfast   Continuous Infusions: . sodium chloride 75 mL/hr at 04/05/19 0525  . ciprofloxacin 400 mg (04/05/19 0526)  . metronidazole 500 mg (04/05/19 0910)     LOS: 6 days    Time spent: 35 minutes.     Elmarie Shiley, MD [facility]   If  7PM-7AM, please contact night-coverage [email or web address]  04/05/2019, 1:58 PM

## 2019-04-05 NOTE — Progress Notes (Signed)
[facility]/Trauma Progress Note  5 Days Post-Op   Assessment/Plan Severe COPDon home O2 Severe medical comorbidities. Appreciate medical assistance in managing these.  Acute gangrenous appendicitis, perforation - S/P lap appy, JP drain, Dr. Rosendo Gros, 02/03 - await return of bowel function- ambulate, IS - Low threshold for CT scan postop day 4-5 if not improved since she is high risk for abscess, WBC WNL Hypokalemia - replace K IV Ileus - pt pulled NGT over weekend. No emesis since. + Nausea and + flatus - AXR pending  FEN:CLD VTE: SCD's, lovenox VP:[id number] IV Cipro &Flagyl 02/02>>WBC down to 11.7afebrile Foley:none Follow up:Dr. Rosendo Gros  Plan: await return of bowel function, AXR pending, lovenox, replace K   LOS: 6 days    Subjective: CC: nausea  Pt states issues with nausea for years. No vomiting. Having flatus. No issues overnight. Pt states she pulled out her NGT this weekend. She denies vomiting since NGT came out.   Objective: Vital signs in last 24 hours: Temp:  [97.5 F (36.4 C)-98.3 F (36.8 C)] 98.3 F (36.8 C) (02/08 0553) Pulse Rate:  [63-84] 63 (02/08 0553) Resp:  [17-18] 18 (02/08 0553) BP: (120-135)/(73-75) 120/74 (02/08 0553) SpO2:  [93 %-99 %] 93 % (02/08 0800) Last BM Date: 04/03/19  Intake/Output from previous day: 02/07 0701 - 02/08 0700 In: 4859.8 [P.O.:1080; I.V.:2484.3; IV Piggyback:1295.5] Out: 765 [Urine:700; Drains:65] Intake/Output this shift: Total I/O In: 37 [P.O.:50] Out: 90 [Drains:90]  PE:  Gen: Alert, NAD Pulm: rate andeffort normal, [place] O2 Abd: Soft, mild distention,+ BS,TTP RLQ and periumbilical region without guarding, no peritonitis, incisions with glue intact are well appearing and are without drainage. Drain is SS Skin: no rashes noted, warm and dry   Anti-infectives: Anti-infectives (From admission, onward)   Start     Dose/Rate Route Frequency Ordered Stop   03/31/19 0500   ciprofloxacin (CIPRO) IVPB 400 mg     400 mg 200 mL/hr over 60 Minutes Intravenous Every 12 hours 03/30/19 2242     03/31/19 0200  metroNIDAZOLE (FLAGYL) IVPB 500 mg     500 mg 100 mL/hr over 60 Minutes Intravenous Every 8 hours 03/30/19 2242     03/30/19 1630  ciprofloxacin (CIPRO) IVPB 400 mg     400 mg 200 mL/hr over 60 Minutes Intravenous  Once 03/30/19 1620 03/30/19 1914   03/30/19 1630  metroNIDAZOLE (FLAGYL) IVPB 500 mg     500 mg 100 mL/hr over 60 Minutes Intravenous  Once 03/30/19 1620 03/30/19 1914      Lab Results:  Recent Labs    04/04/19 0832 04/05/19 0421  WBC 7.3 8.2  HGB 10.8* 10.6*  HCT 33.8* 33.4*  PLT 276 275   BMET Recent Labs    04/04/19 0832 04/05/19 0421  NA 142 142  K 2.6* 3.0*  CL 103 103  CO2 31 30  GLUCOSE 124* 128*  BUN 12 11  CREATININE 0.74 0.80  CALCIUM 8.1* 8.2*   PT/INR No results for input(s): LABPROT, INR in the last 72 hours. CMP     Component Value Date/Time   NA 142 04/05/2019 0421   NA 140 12/09/2018 1133   K 3.0 (L) 04/05/2019 0421   CL 103 04/05/2019 0421   CO2 30 04/05/2019 0421   GLUCOSE 128 (H) 04/05/2019 0421   BUN 11 04/05/2019 0421   BUN 18 12/09/2018 1133   CREATININE 0.80 04/05/2019 0421   CALCIUM 8.2 (L) 04/05/2019 0421   PROT 7.6 03/31/2019 0417   PROT 6.5  08/19/2017 0822   ALBUMIN 4.3 03/31/2019 0417   ALBUMIN 4.5 08/19/2017 0822   AST 20 03/31/2019 0417   ALT 28 03/31/2019 0417   ALKPHOS 125 03/31/2019 0417   BILITOT 1.2 03/31/2019 0417   BILITOT 0.3 08/19/2017 0822   GFRNONAA >60 04/05/2019 0421   GFRAA >60 04/05/2019 0421   Lipase     Component Value Date/Time   LIPASE 21 03/30/2019 1336    Studies/Results: No results found.   Kalman Drape, PA-C [facility] Please see amion for pager for the following: Myna Hidalgo, W, & Friday 7:00am - 4:30pm Thursdays 7:00am -11:30am

## 2019-04-05 NOTE — Progress Notes (Signed)
Patient was encouraged to ambulate this morning and refused, patient educated and we will continue to monitor.

## 2019-04-05 NOTE — Progress Notes (Signed)
Physical Therapy Treatment Patient Details Name: Joan [name] MRN: [id number] DOB: [birth year] Today's Date: 04/05/2019    History of Present Illness Patient is 59 y.o. female s/p laproscopic appendectomy on 03/31/19 with PMH significant for HTN, hypothyroidism, GERD, COPD, DM, chronic pain, anxiety, depression, and OA. She also had a recent Lt THR on 01/26/19 and continues to be on posterior hip precautions.    PT Comments    Pt amb hallway distance to tolerance, having a little more abd pain today.  Planning to  Shower later. encouraged pt to continue to mobilize as much as tolerated.  Continue PT POC  Follow Up Recommendations  Home health PT;Supervision for mobility/OOB     Equipment Recommendations  None recommended by PT    Recommendations for Other Services       Precautions / Restrictions Precautions Precautions: Fall;Posterior Hip Restrictions Weight Bearing Restrictions: No    Mobility  Bed Mobility               General bed mobility comments: pt on BSC  Transfers Overall transfer level: Needs assistance Equipment used: Rolling walker (2 wheeled) Transfers: Sit to/from Stand Sit to Stand: Supervision;Min guard         General transfer comment: cues for hand placement, incr time  Ambulation/Gait Ambulation/Gait assistance: Min guard;Supervision Gait Distance (Feet): 55 Feet Assistive device: Rolling walker (2 wheeled) Gait Pattern/deviations: Step-through pattern;Decreased stride length;Drifts right/left;Trunk flexed Gait velocity: decreased   General Gait Details: cues for posture, Rw position, breathing and to self monitor fatigue level;  SpO2 89-91% on RA, replaced  at 1L at rest    Stairs             Wheelchair Mobility    Modified Rankin (Stroke Patients Only)       Balance                                            Cognition Arousal/Alertness: Awake/alert Behavior During Therapy: WFL for tasks  assessed/performed Overall Cognitive Status: Within Functional Limits for tasks assessed                                        Exercises      General Comments        Pertinent Vitals/Pain Pain Assessment: Faces Faces Pain Scale: Hurts a little bit Pain Location: ABD Pain Descriptors / Indicators: Sore;Guarding;Grimacing Pain Intervention(s): Limited activity within patient's tolerance;Monitored during session;Repositioned    Home Living                      Prior Function            PT Goals (current goals can now be found in the care plan section) Acute Rehab PT Goals Patient Stated Goal: to get home again PT Goal Formulation: With patient Time For Goal Achievement: 04/15/19 Potential to Achieve Goals: Good Progress towards PT goals: Progressing toward goals    Frequency    Min 3X/week      PT Plan Current plan remains appropriate    Co-evaluation              AM-PAC PT "6 Clicks" Mobility   Outcome Measure  Help needed turning from your back to your side while in a flat bed  without using bedrails?: A Little Help needed moving from lying on your back to sitting on the side of a flat bed without using bedrails?: A Little Help needed moving to and from a bed to a chair (including a wheelchair)?: A Little Help needed standing up from a chair using your arms (e.g., wheelchair or bedside chair)?: A Little Help needed to walk in hospital room?: A Little Help needed climbing 3-5 steps with a railing? : A Little 6 Click Score: 18    End of Session   Activity Tolerance: Patient tolerated treatment well Patient left: in chair;with call bell/phone within reach;with chair alarm set   PT Visit Diagnosis: Unsteadiness on feet (R26.81);Muscle weakness (generalized) (M62.81);Difficulty in walking, not elsewhere classified (R26.2)     Time: 1610-9604 PT Time Calculation (min) (ACUTE ONLY): 23 min  Charges:  $Gait Training: 23-37  mins                     Dolph Tavano, PT   Acute Rehab Dept [facility]): [phone]   04/05/2019    [facility] 04/05/2019, 12:57 PM

## 2019-04-06 DIAGNOSIS — K3532 Acute appendicitis with perforation and localized peritonitis, without abscess: Principal | ICD-10-CM

## 2019-04-06 DIAGNOSIS — J449 Chronic obstructive pulmonary disease, unspecified: Secondary | ICD-10-CM | POA: Diagnosis not present

## 2019-04-06 DIAGNOSIS — J441 Chronic obstructive pulmonary disease with (acute) exacerbation: Secondary | ICD-10-CM | POA: Diagnosis not present

## 2019-04-06 DIAGNOSIS — J9611 Chronic respiratory failure with hypoxia: Secondary | ICD-10-CM | POA: Diagnosis not present

## 2019-04-06 DIAGNOSIS — E876 Hypokalemia: Secondary | ICD-10-CM | POA: Diagnosis not present

## 2019-04-06 LAB — CBC
HCT: 34.7 % — ABNORMAL LOW (ref 36.0–46.0)
Hemoglobin: 11 g/dL — ABNORMAL LOW (ref 12.0–15.0)
MCH: 30.6 pg (ref 26.0–34.0)
MCHC: 31.7 g/dL (ref 30.0–36.0)
MCV: 96.7 fL (ref 80.0–100.0)
Platelets: 352 10*3/uL (ref 150–400)
RBC: 3.59 MIL/uL — ABNORMAL LOW (ref 3.87–5.11)
RDW: 13.8 % (ref 11.5–15.5)
WBC: 8.3 10*3/uL (ref 4.0–10.5)
nRBC: 0 % (ref 0.0–0.2)

## 2019-04-06 LAB — BASIC METABOLIC PANEL
Anion gap: 6 (ref 5–15)
BUN: 7 mg/dL (ref 6–20)
CO2: 30 mmol/L (ref 22–32)
Calcium: 8.1 mg/dL — ABNORMAL LOW (ref 8.9–10.3)
Chloride: 105 mmol/L (ref 98–111)
Creatinine, Ser: 0.92 mg/dL (ref 0.44–1.00)
GFR calc Af Amer: >60 mL/min (ref >60)
GFR calc non Af Amer: >60 mL/min (ref >60)
Glucose, Bld: 124 mg/dL — ABNORMAL HIGH (ref 70–99)
Potassium: 3.3 mmol/L — ABNORMAL LOW (ref 3.5–5.1)
Sodium: 141 mmol/L (ref 135–145)

## 2019-04-06 LAB — GLUCOSE, CAPILLARY
Glucose-Capillary: 102 mg/dL — ABNORMAL HIGH (ref 70–99)
Glucose-Capillary: 112 mg/dL — ABNORMAL HIGH (ref 70–99)
Glucose-Capillary: 117 mg/dL — ABNORMAL HIGH (ref 70–99)
Glucose-Capillary: 80 mg/dL (ref 70–99)
Glucose-Capillary: 87 mg/dL (ref 70–99)
Glucose-Capillary: 95 mg/dL (ref 70–99)

## 2019-04-06 MED ORDER — POTASSIUM CHLORIDE 10 MEQ/100ML IV SOLN
10.0000 meq | INTRAVENOUS | Status: AC
  Administered 2019-04-06: 10 meq via INTRAVENOUS
  Filled 2019-04-06: qty 100

## 2019-04-06 NOTE — Progress Notes (Signed)
Occupational Therapy Treatment Patient Details Name: Joan [name] MRN: [id number] DOB: 11-Oct-1960 Today's Date: 04/06/2019    History of present illness Patient is 59 y.o. female s/p laproscopic appendectomy on 03/31/19 with PMH significant for HTN, hypothyroidism, GERD, COPD, DM, chronic pain, anxiety, depression, and OA. She also had a recent Lt THR on 01/26/19 and continues to be on posterior hip precautions.   OT comments  Pain limiting pt.  Feel pt is self limiting at times.  Encouraged pt walk to bathroom next time she needs to go. ( with assist)  Follow Up Recommendations  SNF    Equipment Recommendations  None recommended by OT    Recommendations for Other Services      Precautions / Restrictions Precautions Precautions: Fall;Posterior Hip Restrictions Weight Bearing Restrictions: No       Mobility Bed Mobility               General bed mobility comments: pt sitting EOB  Transfers Overall transfer level: Needs assistance Equipment used: Rolling walker (2 wheeled) Transfers: Sit to/from [employer] Sit to Stand: Min assist Stand pivot transfers: Min assist       General transfer comment: cues for hand placement, incr time    Balance   Sitting-balance support: Feet supported;No upper extremity supported Sitting balance-Leahy Scale: Good     Standing balance support: During functional activity Standing balance-Leahy Scale: Fair                             ADL either performed or assessed with clinical judgement   ADL Overall ADL's : Needs assistance/impaired                         Toilet Transfer: Minimal assistance;BSC;Stand-pivot;Cueing for safety;Cueing for sequencing   Toileting- Clothing Manipulation and Hygiene: Sit to/from stand;Cueing for compensatory techniques;Cueing for safety;Cueing for sequencing;Moderate assistance Toileting - Clothing Manipulation Details (indicate cue type and reason): pt  insisted on having A with her bottom hygiene.  Pt states she was in 20/10 pain.  RN called and pt returned to bed       General ADL Comments: pt able to get to [facility]     Vision Patient Visual Report: No change from baseline            Cognition Arousal/Alertness: Awake/alert Behavior During Therapy: WFL for tasks assessed/performed Overall Cognitive Status: Within Functional Limits for tasks assessed                                                     Pertinent Vitals/ Pain       Pain Assessment: 0-10 Pain Score: 10-Worst pain ever Pain Location: ABD Pain Descriptors / Indicators: Sore;Guarding;Grimacing Pain Intervention(s): Limited activity within patient's tolerance;Repositioned;Patient requesting pain meds-RN notified         Frequency  Min 2X/week        Progress Toward Goals  OT Goals(current goals can now be found in the care plan section)  Progress towards OT goals: Progressing toward goals     Plan Discharge plan remains appropriate       AM-PAC OT "6 Clicks" Daily Activity     Outcome Measure   Help from another person eating meals?: None Help from another person taking  care of personal grooming?: None Help from another person toileting, which includes using toliet, bedpan, or urinal?: A Little Help from another person bathing (including washing, rinsing, drying)?: A Little Help from another person to put on and taking off regular upper body clothing?: A Little Help from another person to put on and taking off regular lower body clothing?: A Lot 6 Click Score: 19    End of Session Equipment Utilized During Treatment: Rolling walker  OT Visit Diagnosis: Unsteadiness on feet (R26.81);Repeated falls (R29.6);Muscle weakness (generalized) (M62.81);Pain   Activity Tolerance Patient limited by pain   Patient Left with call bell/phone within reach;in bed   Nurse Communication Mobility status        Time: 8413-2440 OT Time  Calculation (min): 10 min  Charges: OT General Charges $OT Visit: 1 Visit OT Treatments $Self Care/Home Management : 8-22 mins  Lise Auer, OT Acute Rehabilitation Services [phone] Office- [phone]      Artis Beggs, Karin Golden D 04/06/2019, 1:48 PM

## 2019-04-06 NOTE — Progress Notes (Signed)
PROGRESS NOTE    Joan [name]  QTM:[id number] DOB: 1960/12/17 DOA: 03/30/2019 PCP: [name], MD   Brief Narrative: 50 YOF with COPD chronic hypoxic respiratory failure on 2 L [place] at night and 2 to 3 L during exertion in day, hypothyroidism, anxiety/bipolar disorder, diet-controlled diabetes, HTN, HLD admitted with right lower quadrant abdominal pain CT showed acute nonruptured appendicitis status post lap appendectomy 2/3. Hospitalist team following for medical management.  Subjective:  Complains of bloating sensation and some abdominal discomfort mostly in the epigastric area Overnight no episode of fever. Not using nasal cannula consistently  Assessment & Plan:   Acute gangrenous appendicitis with perforation: s/p lap appendectomy and Jp drain 2/3.  Being managed by general surgery, on clear liquid diet, IV Cipro and Flagyl. Awaiting for return of bowel function.  Hypokalemia , k 3.3 repleting IV.  Ileus: Patient pulled NG tube over the weekend, no vomiting, passing flatus small bowel.  Continue plan as per surgery.  COPD with chronic hypoxic respiratory failure 2 L [place] at night and 2 to 3 L during exertion in day : On Dulera, DuoNeb  as needed bronchodilators  Anxiety/BPD: Continue home Effexor, trazodone, Seroquel  Essential hypertension: BP well controlled.  On metoprolol.  Dyslipidemia-resume statins once tolerating po  Diet-controlled diabetes mellitus: Continue diet control.  Hypothyroidism: on Synthroid.  GERD without esophagitis- cont ppi  Nutrition: CLD-advance as per CCS  Body mass index is 32.27 kg/m.    DVT prophylaxis: lovenox Code Status:full Family Communication: plan of care discussed with patient at bedside. Disposition Plan:Patient is from:home       Anticipated d/c to: TBD, once tolerating diet.       Barriers to d/c or conditions that needs to be met prior to d/c: Awaiting for return of bowel function.  Hospitalist team will continue to  follow.  Consultants:TR[facility] Procedures:S/P LAP APPENDECTOMY Microbiology:see note Antimicrobials: Anti-infectives (From admission, onward)   Start     Dose/Rate Route Frequency Ordered Stop   03/31/19 0500  ciprofloxacin (CIPRO) IVPB 400 mg     400 mg 200 mL/hr over 60 Minutes Intravenous Every 12 hours 03/30/19 2242     03/31/19 0200  metroNIDAZOLE (FLAGYL) IVPB 500 mg     500 mg 100 mL/hr over 60 Minutes Intravenous Every 8 hours 03/30/19 2242     03/30/19 1630  ciprofloxacin (CIPRO) IVPB 400 mg     400 mg 200 mL/hr over 60 Minutes Intravenous  Once 03/30/19 1620 03/30/19 1914   03/30/19 1630  metroNIDAZOLE (FLAGYL) IVPB 500 mg     500 mg 100 mL/hr over 60 Minutes Intravenous  Once 03/30/19 1620 03/30/19 1914      Medications: Scheduled Meds: . acetaminophen  1,000 mg Oral Q8H  . enoxaparin (LOVENOX) injection  40 mg Subcutaneous Daily  . gabapentin  300 mg Oral BID  . insulin aspart  0-9 Units Subcutaneous Q4H  . ipratropium-albuterol  3 mL Nebulization BID  . levothyroxine  25 mcg Oral Q0600  . metoprolol tartrate  2.5 mg Intravenous Q6H  . mometasone-formoterol  2 puff Inhalation BID  . nicotine  14 mg Transdermal Daily  . pantoprazole (PROTONIX) IV  40 mg Intravenous Q12H  . QUEtiapine  50 mg Oral BID  . sodium chloride flush  3 mL Intravenous Once  . traZODone  100 mg Oral QHS  . venlafaxine XR  150 mg Oral Q breakfast   Continuous Infusions: . sodium chloride 75 mL/hr at 04/06/19 0020  . ciprofloxacin 400  mg (04/06/19 0534)  . metronidazole 500 mg (04/06/19 0206)    Objective: Vitals:   04/05/19 2110 04/05/19 2322 04/06/19 0233 04/06/19 0602  BP: (!) 150/100 138/79  (!) 143/81  Pulse: 71 81 82 64  Resp: 18  18 16   Temp: 97.7 F (36.5 C)   98 F (36.7 C)  TempSrc: Oral   Oral  SpO2: 97% 92% 97% 99%  Weight:      Height:        Intake/Output Summary (Last 24 hours) at 04/06/2019 0728 Last data filed at 04/06/2019 0655 Gross per 24 hour  Intake 3271.91 ml   Output 1522 ml  Net 1749.91 ml   Filed Weights   03/30/19 1259 03/31/19 1135  Weight: 90.7 kg 90.7 kg   Weight change:   Body mass index is 32.27 kg/m.  Intake/Output from previous day: 02/08 0701 - 02/09 0700 In: 3271.9 [P.O.:1140; I.V.:1320.3; IV Piggyback:811.6] Out: 1522 [Urine:1001; Drains:520; Stool:1] Intake/Output this shift: No intake/output data recorded.  Examination:  General exam: AAOx3,NAD, Weak appearing. HEENT:Oral mucosa moist, Ear/Nose WNL grossly, dentition normal. Respiratory system: Clear bilaterally, no wheezing or crackles,no use of accessory muscle Cardiovascular system: S1 & S2 +, No JVD,. Gastrointestinal system: Abdomen soft, mildly distended, surgical site dressing intact,BS+/sluggish Nervous System:Alert, awake, moving extremities and grossly nonfocal Extremities: No edema, distal peripheral pulses palpable.  Skin: No rashes,no icterus. MSK: Normal muscle bulk,tone, power   Data Reviewed: I have personally reviewed following labs and imaging studies  CBC: Recent Labs  Lab 03/31/19 0417 04/01/19 0346 04/02/19 0400 04/04/19 0832 04/05/19 0421  WBC 18.4* 17.0* 11.7* 7.3 8.2  HGB 13.1 11.2* 10.5* 10.8* 10.6*  HCT 41.7 35.2* 32.9* 33.8* 33.4*  MCV 97.4 96.2 96.5 95.5 96.5  PLT 240 184 187 276 220   Basic Metabolic Panel: Recent Labs  Lab 03/31/19 0417 04/01/19 0346 04/02/19 0400 04/04/19 0832 04/05/19 0421  NA 136 136 138 142 142  K 3.9 3.6 3.3* 2.6* 3.0*  CL 96* 99 101 103 103  CO2 24 29 29 31 30   GLUCOSE 120* 140* 119* 124* 128*  BUN 17 11 9 12 11   CREATININE 1.23* 1.02* 0.93 0.74 0.80  CALCIUM 9.1 8.4* 8.6* 8.1* 8.2*  MG  --   --   --   --  2.1   GFR: Estimated Creatinine Clearance: 88.1 mL/min (by C-G formula based on SCr of 0.8 mg/dL). Liver Function Tests: Recent Labs  Lab 03/30/19 1336 03/31/19 0417  AST 25 20  ALT 31 28  ALKPHOS 135* 125  BILITOT 0.9 1.2  PROT 7.4 7.6  ALBUMIN 4.2 4.3   Recent Labs  Lab  03/30/19 1336  LIPASE 21   No results for input(s): AMMONIA in the last 168 hours. Coagulation Profile: No results for input(s): INR, PROTIME in the last 168 hours. Cardiac Enzymes: No results for input(s): CKTOTAL, CKMB, CKMBINDEX, TROPONINI in the last 168 hours. BNP (last 3 results) No results for input(s): PROBNP in the last 8760 hours. HbA1C: No results for input(s): HGBA1C in the last 72 hours. CBG: Recent Labs  Lab 04/05/19 1243 04/05/19 1643 04/05/19 2001 04/05/19 2328 04/06/19 0404  GLUCAP 122* 109* 113* 124* 95   Lipid Profile: No results for input(s): CHOL, HDL, LDLCALC, TRIG, CHOLHDL, LDLDIRECT in the last 72 hours. Thyroid Function Tests: No results for input(s): TSH, T4TOTAL, FREET4, T3FREE, THYROIDAB in the last 72 hours. Anemia Panel: No results for input(s): VITAMINB12, FOLATE, FERRITIN, TIBC, IRON, RETICCTPCT in the last 72 hours.  Sepsis Labs: No results for input(s): PROCALCITON, LATICACIDVEN in the last 168 hours.  Recent Results (from the past 240 hour(s))  Respiratory Panel by RT PCR (Flu A&B, Covid) - Nasopharyngeal Swab     Status: None   Collection Time: 03/30/19  5:01 PM   Specimen: Nasopharyngeal Swab  Result Value Ref Range Status   SARS Coronavirus 2 by RT PCR NEGATIVE NEGATIVE Final    Comment: (NOTE) SARS-CoV-2 target nucleic acids are NOT DETECTED. The SARS-CoV-2 RNA is generally detectable in upper respiratoy specimens during the acute phase of infection. The lowest concentration of SARS-CoV-2 viral copies this assay can detect is 131 copies/mL. A negative result does not preclude SARS-Cov-2 infection and should not be used as the sole basis for treatment or other patient management decisions. A negative result may occur with  improper specimen collection/handling, submission of specimen other than nasopharyngeal swab, presence of viral mutation(s) within the areas targeted by this assay, and inadequate number of viral copies (<131  copies/mL). A negative result must be combined with clinical observations, patient history, and epidemiological information. The expected result is Negative. Fact Sheet for Patients:  PinkCheek.be Fact Sheet for Healthcare Providers:  GravelBags.it This test is not yet ap proved or cleared by the [place] FDA and  has been authorized for detection and/or diagnosis of SARS-CoV-2 by FDA under an Emergency Use Authorization (EUA). This EUA will remain  in effect (meaning this test can be used) for the duration of the COVID-19 declaration under Section 564(b)(1) of the Act, 21 U.S.C. section 360bbb-3(b)(1), unless the authorization is terminated or revoked sooner.    Influenza A by PCR NEGATIVE NEGATIVE Final   Influenza B by PCR NEGATIVE NEGATIVE Final    Comment: (NOTE) The Xpert Xpress SARS-CoV-2/FLU/RSV assay is intended as an aid in  the diagnosis of influenza from Nasopharyngeal swab specimens and  should not be used as a sole basis for treatment. Nasal washings and  aspirates are unacceptable for Xpert Xpress SARS-CoV-2/FLU/RSV  testing. Fact Sheet for Patients: PinkCheek.be Fact Sheet for Healthcare Providers: GravelBags.it This test is not yet approved or cleared by the [place] FDA and  has been authorized for detection and/or diagnosis of SARS-CoV-2 by  FDA under an Emergency Use Authorization (EUA). This EUA will remain  in effect (meaning this test can be used) for the duration of the  Covid-19 declaration under Section 564(b)(1) of the Act, 21  U.S.C. section 360bbb-3(b)(1), unless the authorization is  terminated or revoked. Performed at [facility], [place] [address], [place], [place] [address]       Radiology Studies: DG Abd Portable 1V  Result Date: 04/05/2019 CLINICAL DATA:  Follow up small bowel obstruction EXAM:  PORTABLE ABDOMEN - 1 VIEW COMPARISON:  04/02/2018 FINDINGS: Scattered large and small bowel gas is noted. Persistent small bowel dilatation is noted similar to that seen on the prior exam. Gastric catheter has been removed in the interval. Postsurgical drain is seen in the right lower quadrant with a Jackson-Pratt bulb on the left. Changes of prior lumbar surgery and hip replacements bilaterally are noted. No other focal abnormality is seen. No free air is seen. IMPRESSION: Persistent small bowel dilatation consistent with at least partial small bowel obstruction. No free air is noted. Electronically Signed   By: Inez Catalina M.D.   On: 04/05/2019 11:08     LOS: 7 days   Time spent: More than 50% of that time was spent in counseling and/or coordination of  care.  Antonieta Pert, MD [facility]  04/06/2019, 7:28 AM

## 2019-04-06 NOTE — Progress Notes (Signed)
PIV site assessment: Pt reported constant aching at site. Flushed easily, blood return noted. Tape repositioned to avoid pulling on site. Noted potassium rate had been slowed by bedside RN.

## 2019-04-06 NOTE — Progress Notes (Signed)
Pt actively vomiting when RT came to place on CPAP.  RT will hold CPAP at this time, RT to monitor and assess as needed.

## 2019-04-06 NOTE — Progress Notes (Addendum)
Pt was alert and oriented throughout the shift. Complained of nausea and given Zofran 4mg ; responded well. Pt has complained of abdominal pain and cramping related to gas and was given oxycodone 5mg  and instructed by MD to ambulate and change positions frequently.   Pt ambulated in hall and to bathroom several times throughout shift and tolerated well; was able to pass more gas. States she is wanting to eat solid food again soon.  Had short visit with her brother after lunch and was in pleasant mood with no more complaints of pain/nausea.

## 2019-04-06 NOTE — Care Management Important Message (Signed)
Important Message  Patient Details IM Letter given to [facility] Case Manager to present to the Patient Name: Joan [name] MRN: [id number] Date of Birth: Jan 16, 1961   Medicare Important Message Given:  Yes     Kerin Salen 04/06/2019, 12:58 PM

## 2019-04-06 NOTE — Progress Notes (Signed)
[facility] Progress Note  6 Days Post-Op   Assessment/Plan Severe COPDon home O2 Severe medical comorbidities. Appreciate medical assistance in managing these.  Acute gangrenous appendicitis, perforation - S/P lap appy, JP drain, Dr. Rosendo Gros, 02/03 - await return of bowel function- ambulate, IS -Low threshold for CT scan postop day 4-5 if not improved since she is high risk for abscess, WBC WNL Hypokalemia - replace K IV Ileus - pt pulled NGT over weekend. No emesis since. + flatus and small BM - AXR yesterday showed small bowel dilatation  FEN:CLD VTE: SCD's, lovenox [id number] IVCipro &Flagyl 02/02>>WBCdown to 8.3afebrile Foley:none Follow up:Dr. Rosendo Gros  Plan: await return of bowel function, PT recs HH   LOS: 7 days    Subjective: CC: crampy abdominal pain  Pt states she did not vomit last night. She had a small BM and flatus. She feels very crampy this am. She is not hungry. No nausea today.  Objective: Vital signs in last 24 hours: Temp:  [97.4 F (36.3 C)-98 F (36.7 C)] 97.7 F (36.5 C) (02/09 0854) Pulse Rate:  [64-82] 68 (02/09 0854) Resp:  [14-18] 14 (02/09 0854) BP: (114-150)/(70-100) 149/90 (02/09 0854) SpO2:  [92 %-99 %] 97 % (02/09 0854) Last BM Date: 04/05/19  Intake/Output from previous day: 02/08 0701 - 02/09 0700 In: 3271.9 [P.O.:1140; I.V.:1320.3; IV Piggyback:811.6] Out: 21 [Urine:1001; Drains:520; Stool:1] Intake/Output this shift: Total I/O In: -  Out: 450 [Urine:400; Drains:50]  PE:  Gen: Alert, NAD Pulm: CTA BL,rate andeffort normal, [place] O2 Abd: Soft, mild distention,+ BS,TTP RLQ and periumbilical region without guarding, no peritonitis, incisions with glue intact are well appearing and are without drainage. Drain is serous Skin: no rashes noted, warm and dry  Anti-infectives: Anti-infectives (From admission, onward)   Start     Dose/Rate Route Frequency Ordered Stop   03/31/19  0500  ciprofloxacin (CIPRO) IVPB 400 mg     400 mg 200 mL/hr over 60 Minutes Intravenous Every 12 hours 03/30/19 2242     03/31/19 0200  metroNIDAZOLE (FLAGYL) IVPB 500 mg     500 mg 100 mL/hr over 60 Minutes Intravenous Every 8 hours 03/30/19 2242     03/30/19 1630  ciprofloxacin (CIPRO) IVPB 400 mg     400 mg 200 mL/hr over 60 Minutes Intravenous  Once 03/30/19 1620 03/30/19 1914   03/30/19 1630  metroNIDAZOLE (FLAGYL) IVPB 500 mg     500 mg 100 mL/hr over 60 Minutes Intravenous  Once 03/30/19 1620 03/30/19 1914      Lab Results:  Recent Labs    04/05/19 0421 04/06/19 0817  WBC 8.2 8.3  HGB 10.6* 11.0*  HCT 33.4* 34.7*  PLT 275 352   BMET Recent Labs    04/05/19 0421 04/06/19 0817  NA 142 141  K 3.0* 3.3*  CL 103 105  CO2 30 30  GLUCOSE 128* 124*  BUN 11 7  CREATININE 0.80 0.92  CALCIUM 8.2* 8.1*   PT/INR No results for input(s): LABPROT, INR in the last 72 hours. CMP     Component Value Date/Time   NA 141 04/06/2019 0817   NA 140 12/09/2018 1133   K 3.3 (L) 04/06/2019 0817   CL 105 04/06/2019 0817   CO2 30 04/06/2019 0817   GLUCOSE 124 (H) 04/06/2019 0817   BUN 7 04/06/2019 0817   BUN 18 12/09/2018 1133   CREATININE 0.92 04/06/2019 0817   CALCIUM 8.1 (L) 04/06/2019 0817   PROT 7.6 03/31/2019 0417   PROT 6.5  08/19/2017 0822   ALBUMIN 4.3 03/31/2019 0417   ALBUMIN 4.5 08/19/2017 0822   AST 20 03/31/2019 0417   ALT 28 03/31/2019 0417   ALKPHOS 125 03/31/2019 0417   BILITOT 1.2 03/31/2019 0417   BILITOT 0.3 08/19/2017 0822   GFRNONAA >60 04/06/2019 0817   GFRAA >60 04/06/2019 0817   Lipase     Component Value Date/Time   LIPASE 21 03/30/2019 1336    Studies/Results: DG Abd Portable 1V  Result Date: 04/05/2019 CLINICAL DATA:  Follow up small bowel obstruction EXAM: PORTABLE ABDOMEN - 1 VIEW COMPARISON:  04/02/2018 FINDINGS: Scattered large and small bowel gas is noted. Persistent small bowel dilatation is noted similar to that seen on the prior  exam. Gastric catheter has been removed in the interval. Postsurgical drain is seen in the right lower quadrant with a Jackson-Pratt bulb on the left. Changes of prior lumbar surgery and hip replacements bilaterally are noted. No other focal abnormality is seen. No free air is seen. IMPRESSION: Persistent small bowel dilatation consistent with at least partial small bowel obstruction. No free air is noted. Electronically Signed   By: Inez Catalina M.D.   On: 04/05/2019 11:08     Kalman Drape, [facility] Please see amion for pager for the following: Cristine Polio, & Friday 7:00am - 4:30pm Thursdays 7:00am -11:30am

## 2019-04-06 NOTE — Plan of Care (Signed)

## 2019-04-06 NOTE — Progress Notes (Signed)
During multiple rounds on patient, she was found to have O2 off and sats <90. After educating patient the importance of keeping her oxygen on, I still found her removing O2 after leaving her room.

## 2019-04-07 LAB — GLUCOSE, CAPILLARY
Glucose-Capillary: 128 mg/dL — ABNORMAL HIGH (ref 70–99)
Glucose-Capillary: 108 mg/dL — ABNORMAL HIGH (ref 70–99)
Glucose-Capillary: 106 mg/dL — ABNORMAL HIGH (ref 70–99)
Glucose-Capillary: 85 mg/dL (ref 70–99)
Glucose-Capillary: 101 mg/dL — ABNORMAL HIGH (ref 70–99)

## 2019-04-07 LAB — BASIC METABOLIC PANEL
Anion gap: 6 (ref 5–15)
BUN: <5 mg/dL — ABNORMAL LOW (ref 6–20)
CO2: 27 mmol/L (ref 22–32)
Calcium: 8.1 mg/dL — ABNORMAL LOW (ref 8.9–10.3)
Chloride: 109 mmol/L (ref 98–111)
Creatinine, Ser: 0.81 mg/dL (ref 0.44–1.00)
GFR calc Af Amer: >60 mL/min (ref >60)
GFR calc non Af Amer: >60 mL/min (ref >60)
Glucose, Bld: 114 mg/dL — ABNORMAL HIGH (ref 70–99)
Potassium: 3.3 mmol/L — ABNORMAL LOW (ref 3.5–5.1)
Sodium: 142 mmol/L (ref 135–145)

## 2019-04-07 LAB — CBC
HCT: 33.9 % — ABNORMAL LOW (ref 36.0–46.0)
Hemoglobin: 10.3 g/dL — ABNORMAL LOW (ref 12.0–15.0)
MCH: 29.7 pg (ref 26.0–34.0)
MCHC: 30.4 g/dL (ref 30.0–36.0)
MCV: 97.7 fL (ref 80.0–100.0)
Platelets: 353 10*3/uL (ref 150–400)
RBC: 3.47 MIL/uL — ABNORMAL LOW (ref 3.87–5.11)
RDW: 13.8 % (ref 11.5–15.5)
WBC: 9.5 10*3/uL (ref 4.0–10.5)
nRBC: 0 % (ref 0.0–0.2)

## 2019-04-07 MED ORDER — POTASSIUM CHLORIDE 10 MEQ/100ML IV SOLN
10.0000 meq | INTRAVENOUS | Status: AC
  Administered 2019-04-07 (×2): 10 meq via INTRAVENOUS
  Filled 2019-04-07 (×2): qty 100

## 2019-04-07 MED ORDER — POTASSIUM CHLORIDE CRYS ER 20 MEQ PO TBCR
40.0000 meq | EXTENDED_RELEASE_TABLET | Freq: Once | ORAL | Status: AC
  Administered 2019-04-07: 40 meq via ORAL
  Filled 2019-04-07: qty 2

## 2019-04-07 MED ORDER — PANTOPRAZOLE SODIUM 40 MG PO TBEC
40.0000 mg | DELAYED_RELEASE_TABLET | Freq: Two times a day (BID) | ORAL | Status: DC
  Administered 2019-04-07 – 2019-04-14 (×14): 40 mg via ORAL
  Filled 2019-04-07 (×14): qty 1

## 2019-04-07 NOTE — Progress Notes (Signed)
Physical Therapy Treatment Patient Details Name: Joan [name] MRN: [id number] DOB: Mar 10, 1960 Today's Date: 04/07/2019    History of Present Illness Patient is 59 y.o. female s/p laproscopic appendectomy on 03/31/19 with PMH significant for HTN, hypothyroidism, GERD, COPD, DM, chronic pain, anxiety, depression, and OA. She also had a recent Lt THR on 01/26/19 and continues to be on posterior hip precautions.    PT Comments    Pt agreeable to ambulate (at least to bathroom) however urgency with BM limited session to [facility].  Pt min/guard for line management and safety however no physical assist required.  Continue to recommend HHPT upon d/c.  Pt also refusing SNF upon d/c.   Follow Up Recommendations  Home health PT;Supervision for mobility/OOB     Equipment Recommendations  None recommended by PT    Recommendations for Other Services       Precautions / Restrictions Precautions Precautions: Fall;Posterior Hip Precaution Comments: THRev post 12/20, Home Oxygen 2 lts at night; JP drain    Mobility  Bed Mobility Overal bed mobility: Needs Assistance Bed Mobility: Supine to Sit;Sit to Supine     Supine to sit: Supervision Sit to supine: Supervision   General bed mobility comments: utilized rail to self assist, supervision for line management  Transfers Overall transfer level: Needs assistance Equipment used: None Transfers: Sit to/from [facility] Sit to Stand: Min guard Stand pivot transfers: Min guard       General transfer comment: min/guard for safety however no physical assist required, urgency due to loose BM  Ambulation/Gait             General Gait Details: pt with frequent loose stools, was going to attempt to ambulate to bathroom however already stooling upon standing so utilized BSC; pt performed self care   Stairs             Wheelchair Mobility    Modified Rankin (Stroke Patients Only)       Balance                                             Cognition Arousal/Alertness: Awake/alert Behavior During Therapy: WFL for tasks assessed/performed Overall Cognitive Status: Within Functional Limits for tasks assessed                                        Exercises      General Comments        Pertinent Vitals/Pain Pain Assessment: 0-10 Pain Score: 10-Worst pain ever Pain Location: some abdominal pain on arrival however none with PA end of session; burning 10/10 at IV site due to K+ Pain Descriptors / Indicators: Guarding;Grimacing Pain Intervention(s): Repositioned;Monitored during session    Home Living                      Prior Function            PT Goals (current goals can now be found in the care plan section) Progress towards PT goals: Progressing toward goals    Frequency    Min 3X/week      PT Plan Current plan remains appropriate    Co-evaluation              AM-PAC PT "6 Clicks" Mobility  Outcome Measure  Help needed turning from your back to your side while in a flat bed without using bedrails?: A Little Help needed moving from lying on your back to sitting on the side of a flat bed without using bedrails?: A Little Help needed moving to and from a bed to a chair (including a wheelchair)?: A Little Help needed standing up from a chair using your arms (e.g., wheelchair or bedside chair)?: A Little Help needed to walk in hospital room?: A Little Help needed climbing 3-5 steps with a railing? : A Little 6 Click Score: 18    End of Session   Activity Tolerance: Patient tolerated treatment well Patient left: with call bell/phone within reach;in bed   PT Visit Diagnosis: Difficulty in walking, not elsewhere classified (R26.2)     Time: 1111-1130 PT Time Calculation (min) (ACUTE ONLY): 19 min  Charges:  $Therapeutic Activity: 8-22 mins                    Arlyce Dice, DPT Acute Rehabilitation Services Office:  [phone]  York Ram E 04/07/2019, 1:13 PM

## 2019-04-07 NOTE — Progress Notes (Addendum)
[facility]/Trauma Progress Note  7 Days Post-Op   Assessment/Plan Severe COPDon home O2 Severe medical comorbidities. Appreciate medical assistance in managing these.  Acute gangrenous appendicitis, perforation - S/P lap appy, JP drain, Dr. Rosendo Gros, 02/03 - await return of bowel function- ambulate, IS - WBC WNL, afebrile Hypokalemia- replace KIV Ileus -resolving, + flatus and BM's  FEN:FLD VTE: SCD's, lovenox [id number] IVCipro &Flagyl 02/02>>WBCdown to 9.5afebrile Foley:none Follow up:Dr. Rosendo Gros  Plan: advance diet as tolerated, PT recs HH, OT recs SNF. CT scan tomorrow if pt's lower abdominal pain continues. She is at a high risk for abscess. Her labs and vitals are reassuring at this time. Drain is serous. No urinary symptoms.    LOS: 8 days    Subjective: CC: lower abdominal pain  No issues overnight. Pt started having loose stool around 0400. No vomiting. Continued lower abdominal pain that she states feels like pressure.   Addendum: [id number], pt is not having lower abdominal pain at this time like before. Copious loose stools. Arm burning from K run.  Objective: Vital signs in last 24 hours: Temp:  [97.5 F (36.4 C)-98.3 F (36.8 C)] 97.9 F (36.6 C) (02/10 0542) Pulse Rate:  [68-80] 72 (02/10 0542) Resp:  [14-18] 18 (02/10 0542) BP: (121-161)/(74-91) 133/76 (02/10 0542) SpO2:  [91 %-100 %] 100 % (02/10 0542) Last BM Date: 04/06/19  Intake/Output from previous day: 02/09 0701 - 02/10 0700 In: 3904.6 [P.O.:1560; I.V.:1027; IV Piggyback:1317.6] Out: 2950 [Urine:2600; Drains:350] Intake/Output this shift: No intake/output data recorded.  PE:  Gen: Alert, NAD Cardio: regular rate  Pulm: CTA BL,rate andeffort normal, [place] O2 Abd: Soft,no distention, very mild TTP in suprapubic region without guarding, no peritonitis, incisions with glue intact are well appearing and are without drainage. Drain is serous  Skin: no rashes  noted, warm and dry    Anti-infectives: Anti-infectives (From admission, onward)   Start     Dose/Rate Route Frequency Ordered Stop   03/31/19 0500  ciprofloxacin (CIPRO) IVPB 400 mg     400 mg 200 mL/hr over 60 Minutes Intravenous Every 12 hours 03/30/19 2242     03/31/19 0200  metroNIDAZOLE (FLAGYL) IVPB 500 mg     500 mg 100 mL/hr over 60 Minutes Intravenous Every 8 hours 03/30/19 2242     03/30/19 1630  ciprofloxacin (CIPRO) IVPB 400 mg     400 mg 200 mL/hr over 60 Minutes Intravenous  Once 03/30/19 1620 03/30/19 1914   03/30/19 1630  metroNIDAZOLE (FLAGYL) IVPB 500 mg     500 mg 100 mL/hr over 60 Minutes Intravenous  Once 03/30/19 1620 03/30/19 1914      Lab Results:  Recent Labs    04/06/19 0817 04/07/19 0358  WBC 8.3 9.5  HGB 11.0* 10.3*  HCT 34.7* 33.9*  PLT 352 353   BMET Recent Labs    04/06/19 0817 04/07/19 0358  NA 141 142  K 3.3* 3.3*  CL 105 109  CO2 30 27  GLUCOSE 124* 114*  BUN 7 <5*  CREATININE 0.92 0.81  CALCIUM 8.1* 8.1*   PT/INR No results for input(s): LABPROT, INR in the last 72 hours. CMP     Component Value Date/Time   NA 142 04/07/2019 0358   NA 140 12/09/2018 1133   K 3.3 (L) 04/07/2019 0358   CL 109 04/07/2019 0358   CO2 27 04/07/2019 0358   GLUCOSE 114 (H) 04/07/2019 0358   BUN <5 (L) 04/07/2019 0358   BUN 18 12/09/2018 1133  CREATININE 0.81 04/07/2019 0358   CALCIUM 8.1 (L) 04/07/2019 0358   PROT 7.6 03/31/2019 0417   PROT 6.5 08/19/2017 0822   ALBUMIN 4.3 03/31/2019 0417   ALBUMIN 4.5 08/19/2017 0822   AST 20 03/31/2019 0417   ALT 28 03/31/2019 0417   ALKPHOS 125 03/31/2019 0417   BILITOT 1.2 03/31/2019 0417   BILITOT 0.3 08/19/2017 0822   GFRNONAA >60 04/07/2019 0358   GFRAA >60 04/07/2019 0358   Lipase     Component Value Date/Time   LIPASE 21 03/30/2019 1336    Studies/Results: DG Abd Portable 1V  Result Date: 04/05/2019 CLINICAL DATA:  Follow up small bowel obstruction EXAM: PORTABLE ABDOMEN - 1 VIEW  COMPARISON:  04/02/2018 FINDINGS: Scattered large and small bowel gas is noted. Persistent small bowel dilatation is noted similar to that seen on the prior exam. Gastric catheter has been removed in the interval. Postsurgical drain is seen in the right lower quadrant with a Jackson-Pratt bulb on the left. Changes of prior lumbar surgery and hip replacements bilaterally are noted. No other focal abnormality is seen. No free air is seen. IMPRESSION: Persistent small bowel dilatation consistent with at least partial small bowel obstruction. No free air is noted. Electronically Signed   By: Inez Catalina M.D.   On: 04/05/2019 11:08     Kalman Drape, [facility] Please see amion for pager for the following: Cristine Polio, & Friday 7:00am - 4:30pm Thursdays 7:00am -11:30am

## 2019-04-07 NOTE — Progress Notes (Signed)
PROGRESS NOTE    SCHELLY [name]  LEX:[id number] DOB: 1960-03-24 DOA: 03/30/2019 PCP: [name], MD   Brief Narrative: 79 YOF with COPD chronic hypoxic respiratory failure on 2 L [place] at night and 2 to 3 L during exertion in day, hypothyroidism, anxiety/bipolar disorder, diet-controlled diabetes, HTN, HLD admitted with right lower quadrant abdominal pain CT showed acute nonruptured appendicitis status post lap appendectomy 2/3. Hospitalist team following for medical management.  Subjective: Reports she had large bowel movement this morning and again having 1 when I was examining her. Some abdomen bloating and abdominal pain present. Afebrile overnight T-max 98.3. Potassium again low at 3.3 this morning.  Assessment & Plan:   Acute gangrenous appendicitis with perforation: s/p lap appendectomy and Jp drain 03/31/19.  Being managed by general surgery, on clear liquid diet, IV Cipro and Flagyl.  Patient is ambulating, , awaiting return of bowel function-having some bowel movement, defer further plan of care to surgery.   Hypokalemia , k 3.3 repleting again IV and po this am.  Ileus: Patient pulled NG tube over the weekend, no vomiting, passing flatus small bowel.  Continue plan as per surgery.  COPD with chronic hypoxic respiratory failure 2 L [place] at night and 2 to 3 L during exertion in day : On Dulera, DuoNeb  as needed bronchodilators.  Respiratory status is stable.  Anxiety/BPD: Continue home Effexor, trazodone, Seroquel  Essential hypertension: BP well controlled.  On metoprolol.  Dyslipidemia-resume statins once tolerating po  Diet-controlled diabetes mellitus: Continue diet control.  Hypothyroidism: on Synthroid.  GERD without esophagitis- cont ppi  Nutrition: CLD-advance as per CCS  Body mass index is 32.27 kg/m.   Discussed with general surgery team this am.  DVT prophylaxis: lovenox Code Status:full Family Communication: plan of care discussed with patient at  bedside. Disposition Plan:Patient is from:home Anticipated d/c to: Home health versus SNF.   Barriers to d/c or conditions that needs to be met prior to d/c: Awaiting for return of bowel function.  Hospitalist team will continue to follow.  Consultants:TRH Procedures:S/P LAP APPENDECTOMY Microbiology:see note Antimicrobials: Anti-infectives (From admission, onward)   Start     Dose/Rate Route Frequency Ordered Stop   03/31/19 0500  ciprofloxacin (CIPRO) IVPB 400 mg     400 mg 200 mL/hr over 60 Minutes Intravenous Every 12 hours 03/30/19 2242     03/31/19 0200  metroNIDAZOLE (FLAGYL) IVPB 500 mg     500 mg 100 mL/hr over 60 Minutes Intravenous Every 8 hours 03/30/19 2242     03/30/19 1630  ciprofloxacin (CIPRO) IVPB 400 mg     400 mg 200 mL/hr over 60 Minutes Intravenous  Once 03/30/19 1620 03/30/19 1914   03/30/19 1630  metroNIDAZOLE (FLAGYL) IVPB 500 mg     500 mg 100 mL/hr over 60 Minutes Intravenous  Once 03/30/19 1620 03/30/19 1914      Medications: Scheduled Meds: . acetaminophen  1,000 mg Oral Q8H  . enoxaparin (LOVENOX) injection  40 mg Subcutaneous Daily  . gabapentin  300 mg Oral BID  . insulin aspart  0-9 Units Subcutaneous Q4H  . ipratropium-albuterol  3 mL Nebulization BID  . levothyroxine  25 mcg Oral Q0600  . metoprolol tartrate  2.5 mg Intravenous Q6H  . mometasone-formoterol  2 puff Inhalation BID  . nicotine  14 mg Transdermal Daily  . pantoprazole (PROTONIX) IV  40 mg Intravenous Q12H  . QUEtiapine  50 mg Oral BID  . sodium chloride flush  3 mL Intravenous Once  .  traZODone  100 mg Oral QHS  . venlafaxine XR  150 mg Oral Q breakfast   Continuous Infusions: . sodium chloride 75 mL/hr at 04/06/19 2159  . ciprofloxacin 400 mg (04/07/19 0547)  . metronidazole 500 mg (04/07/19 0120)    Objective: Vitals:   04/06/19 2109 04/06/19 2308 04/07/19 0016 04/07/19 0542  BP: (!) 161/91  121/74 133/76  Pulse: 74   72  Resp: 18   18  Temp: (!) 97.5 F (36.4  C)   97.9 F (36.6 C)  TempSrc: Oral   Oral  SpO2: 93% 96%  100%  Weight:      Height:        Intake/Output Summary (Last 24 hours) at 04/07/2019 0725 Last data filed at 04/07/2019 7322 Gross per 24 hour  Intake 3904.55 ml  Output 2950 ml  Net 954.55 ml   Filed Weights   03/30/19 1259 03/31/19 1135  Weight: 90.7 kg 90.7 kg   Weight change:   Body mass index is 32.27 kg/m.  Intake/Output from previous day: 02/09 0701 - 02/10 0700 In: 3904.6 [P.O.:1560; I.V.:1027; IV Piggyback:1317.6] Out: 2950 [Urine:2600; Drains:350] Intake/Output this shift: No intake/output data recorded.  Examination: General exam: AAOx3,NAD, Weak appearing. HEENT:Oral mucosa moist, Ear/Nose WNL grossly, dentition normal. Respiratory system: Clear laterally no wheezing or crackles, no use of accessory muscle Cardiovascular system: S1 & S2 +, No JVD,. Gastrointestinal system: Abdomen soft, mildly distended, surgical site intact no drainage,BS+/sluggish Nervous System:Alert, awake, moving extremities and grossly nonfocal Extremities: No edema, distal peripheral pulses palpable.  Skin: No rashes,no icterus. MSK: Normal muscle bulk,tone, power   Data Reviewed: I have personally reviewed following labs and imaging studies  CBC: Recent Labs  Lab 04/02/19 0400 04/04/19 0832 04/05/19 0421 04/06/19 0817 04/07/19 0358  WBC 11.7* 7.3 8.2 8.3 9.5  HGB 10.5* 10.8* 10.6* 11.0* 10.3*  HCT 32.9* 33.8* 33.4* 34.7* 33.9*  MCV 96.5 95.5 96.5 96.7 97.7  PLT 187 276 275 352 025   Basic Metabolic Panel: Recent Labs  Lab 04/02/19 0400 04/04/19 0832 04/05/19 0421 04/06/19 0817 04/07/19 0358  NA 138 142 142 141 142  K 3.3* 2.6* 3.0* 3.3* 3.3*  CL 101 103 103 105 109  CO2 29 31 30 30 27   GLUCOSE 119* 124* 128* 124* 114*  BUN 9 12 11 7  <5*  CREATININE 0.93 0.74 0.80 0.92 0.81  CALCIUM 8.6* 8.1* 8.2* 8.1* 8.1*  MG  --   --  2.1  --   --    GFR: Estimated Creatinine Clearance: 87 mL/min (by C-G  formula based on SCr of 0.81 mg/dL). Liver Function Tests: No results for input(s): AST, ALT, ALKPHOS, BILITOT, PROT, ALBUMIN in the last 168 hours. No results for input(s): LIPASE, AMYLASE in the last 168 hours. No results for input(s): AMMONIA in the last 168 hours. Coagulation Profile: No results for input(s): INR, PROTIME in the last 168 hours. Cardiac Enzymes: No results for input(s): CKTOTAL, CKMB, CKMBINDEX, TROPONINI in the last 168 hours. BNP (last 3 results) No results for input(s): PROBNP in the last 8760 hours. HbA1C: No results for input(s): HGBA1C in the last 72 hours. CBG: Recent Labs  Lab 04/06/19 1205 04/06/19 1601 04/06/19 2013 04/06/19 2357 04/07/19 0321  GLUCAP 102* 87 80 112* 128*   Lipid Profile: No results for input(s): CHOL, HDL, LDLCALC, TRIG, CHOLHDL, LDLDIRECT in the last 72 hours. Thyroid Function Tests: No results for input(s): TSH, T4TOTAL, FREET4, T3FREE, THYROIDAB in the last 72 hours. Anemia Panel: No results  for input(s): VITAMINB12, FOLATE, FERRITIN, TIBC, IRON, RETICCTPCT in the last 72 hours. Sepsis Labs: No results for input(s): PROCALCITON, LATICACIDVEN in the last 168 hours.  Recent Results (from the past 240 hour(s))  Respiratory Panel by RT PCR (Flu A&B, Covid) - Nasopharyngeal Swab     Status: None   Collection Time: 03/30/19  5:01 PM   Specimen: Nasopharyngeal Swab  Result Value Ref Range Status   SARS Coronavirus 2 by RT PCR NEGATIVE NEGATIVE Final    Comment: (NOTE) SARS-CoV-2 target nucleic acids are NOT DETECTED. The SARS-CoV-2 RNA is generally detectable in upper respiratoy specimens during the acute phase of infection. The lowest concentration of SARS-CoV-2 viral copies this assay can detect is 131 copies/mL. A negative result does not preclude SARS-Cov-2 infection and should not be used as the sole basis for treatment or other patient management decisions. A negative result may occur with  improper specimen  collection/handling, submission of specimen other than nasopharyngeal swab, presence of viral mutation(s) within the areas targeted by this assay, and inadequate number of viral copies (<131 copies/mL). A negative result must be combined with clinical observations, patient history, and epidemiological information. The expected result is Negative. Fact Sheet for Patients:  PinkCheek.be Fact Sheet for Healthcare Providers:  GravelBags.it This test is not yet ap proved or cleared by the [place] FDA and  has been authorized for detection and/or diagnosis of SARS-CoV-2 by FDA under an Emergency Use Authorization (EUA). This EUA will remain  in effect (meaning this test can be used) for the duration of the COVID-19 declaration under Section 564(b)(1) of the Act, 21 U.S.C. section 360bbb-3(b)(1), unless the authorization is terminated or revoked sooner.    Influenza A by PCR NEGATIVE NEGATIVE Final   Influenza B by PCR NEGATIVE NEGATIVE Final    Comment: (NOTE) The Xpert Xpress SARS-CoV-2/FLU/RSV assay is intended as an aid in  the diagnosis of influenza from Nasopharyngeal swab specimens and  should not be used as a sole basis for treatment. Nasal washings and  aspirates are unacceptable for Xpert Xpress SARS-CoV-2/FLU/RSV  testing. Fact Sheet for Patients: PinkCheek.be Fact Sheet for Healthcare Providers: GravelBags.it This test is not yet approved or cleared by the [place] FDA and  has been authorized for detection and/or diagnosis of SARS-CoV-2 by  FDA under an Emergency Use Authorization (EUA). This EUA will remain  in effect (meaning this test can be used) for the duration of the  Covid-19 declaration under Section 564(b)(1) of the Act, 21  U.S.C. section 360bbb-3(b)(1), unless the authorization is  terminated or revoked. Performed at [facility], [place] [address], [place], [place] [address]       Radiology Studies: DG Abd Portable 1V  Result Date: 04/05/2019 CLINICAL DATA:  Follow up small bowel obstruction EXAM: PORTABLE ABDOMEN - 1 VIEW COMPARISON:  04/02/2018 FINDINGS: Scattered large and small bowel gas is noted. Persistent small bowel dilatation is noted similar to that seen on the prior exam. Gastric catheter has been removed in the interval. Postsurgical drain is seen in the right lower quadrant with a Jackson-Pratt bulb on the left. Changes of prior lumbar surgery and hip replacements bilaterally are noted. No other focal abnormality is seen. No free air is seen. IMPRESSION: Persistent small bowel dilatation consistent with at least partial small bowel obstruction. No free air is noted. Electronically Signed   By: Inez Catalina M.D.   On: 04/05/2019 11:08     LOS: 8 days   Time spent:  More than 50% of that time was spent in counseling and/or coordination of care.  Antonieta Pert, MD [facility]  04/07/2019, 7:25 AM

## 2019-04-07 NOTE — Progress Notes (Signed)
Pharmacy IV to PO conversion  The patient is receiving pantoprazole by the intravenous route.  Based on criteria approved by the Pharmacy and [place], the medication is being converted to the equivalent oral dose form.   No active GI bleeding or impaired absorption  Not s/p esophagectomy  Documented ability to take oral medications for > 24 hr  Plan to continue treatment for at least 1 day  If you have any questions about this conversion, please contact the Pharmacy Department (ext [phone]).  Thank you.  Reuel Boom, PharmD, BCPS [phone] 04/07/2019, 1:16 PM

## 2019-04-08 ENCOUNTER — Encounter (HOSPITAL_COMMUNITY): Payer: Self-pay | Admitting: Radiology

## 2019-04-08 ENCOUNTER — Inpatient Hospital Stay (HOSPITAL_COMMUNITY): Payer: Medicare HMO

## 2019-04-08 ENCOUNTER — Ambulatory Visit: Payer: Medicare Other

## 2019-04-08 DIAGNOSIS — J449 Chronic obstructive pulmonary disease, unspecified: Secondary | ICD-10-CM

## 2019-04-08 DIAGNOSIS — K3532 Acute appendicitis with perforation and localized peritonitis, without abscess: Secondary | ICD-10-CM | POA: Diagnosis not present

## 2019-04-08 DIAGNOSIS — R1031 Right lower quadrant pain: Secondary | ICD-10-CM | POA: Diagnosis not present

## 2019-04-08 LAB — GLUCOSE, CAPILLARY
Glucose-Capillary: 105 mg/dL — ABNORMAL HIGH (ref 70–99)
Glucose-Capillary: 113 mg/dL — ABNORMAL HIGH (ref 70–99)
Glucose-Capillary: 122 mg/dL — ABNORMAL HIGH (ref 70–99)
Glucose-Capillary: 129 mg/dL — ABNORMAL HIGH (ref 70–99)
Glucose-Capillary: 87 mg/dL (ref 70–99)
Glucose-Capillary: 92 mg/dL (ref 70–99)
Glucose-Capillary: 92 mg/dL (ref 70–99)
Glucose-Capillary: 92 mg/dL (ref 70–99)

## 2019-04-08 LAB — CBC
HCT: 33.2 % — ABNORMAL LOW (ref 36.0–46.0)
Hemoglobin: 10.5 g/dL — ABNORMAL LOW (ref 12.0–15.0)
MCH: 30.6 pg (ref 26.0–34.0)
MCHC: 31.6 g/dL (ref 30.0–36.0)
MCV: 96.8 fL (ref 80.0–100.0)
Platelets: 369 10*3/uL (ref 150–400)
RBC: 3.43 MIL/uL — ABNORMAL LOW (ref 3.87–5.11)
RDW: 13.9 % (ref 11.5–15.5)
WBC: 10.3 10*3/uL (ref 4.0–10.5)
nRBC: 0 % (ref 0.0–0.2)

## 2019-04-08 LAB — BASIC METABOLIC PANEL
Anion gap: 7 (ref 5–15)
BUN: 5 mg/dL — ABNORMAL LOW (ref 6–20)
CO2: 27 mmol/L (ref 22–32)
Calcium: 8.4 mg/dL — ABNORMAL LOW (ref 8.9–10.3)
Chloride: 111 mmol/L (ref 98–111)
Creatinine, Ser: 0.75 mg/dL (ref 0.44–1.00)
GFR calc Af Amer: >60 mL/min (ref >60)
GFR calc non Af Amer: >60 mL/min (ref >60)
Glucose, Bld: 117 mg/dL — ABNORMAL HIGH (ref 70–99)
Potassium: 3.5 mmol/L (ref 3.5–5.1)
Sodium: 145 mmol/L (ref 135–145)

## 2019-04-08 MED ORDER — POTASSIUM CHLORIDE IN NACL 20-0.9 MEQ/L-% IV SOLN
INTRAVENOUS | Status: DC
  Filled 2019-04-08 (×3): qty 1000

## 2019-04-08 MED ORDER — IOHEXOL 9 MG/ML PO SOLN
ORAL | Status: AC
  Filled 2019-04-08: qty 500

## 2019-04-08 MED ORDER — IOHEXOL 9 MG/ML PO SOLN
500.0000 mL | ORAL | Status: AC
  Administered 2019-04-08 (×2): 500 mL via ORAL

## 2019-04-08 MED ORDER — IOHEXOL 300 MG/ML  SOLN
100.0000 mL | Freq: Once | INTRAMUSCULAR | Status: AC | PRN
  Administered 2019-04-08: 100 mL via INTRAVENOUS

## 2019-04-08 MED ORDER — SODIUM CHLORIDE 0.9 % IV SOLN
1.0000 g | Freq: Three times a day (TID) | INTRAVENOUS | Status: DC
  Administered 2019-04-08 – 2019-04-09 (×4): 1 g via INTRAVENOUS
  Filled 2019-04-08 (×5): qty 1

## 2019-04-08 MED ORDER — SODIUM CHLORIDE (PF) 0.9 % IJ SOLN
INTRAMUSCULAR | Status: AC
  Filled 2019-04-08: qty 50

## 2019-04-08 MED ORDER — IOHEXOL 9 MG/ML PO SOLN
ORAL | Status: AC
  Administered 2019-04-08: 500 mL
  Filled 2019-04-08: qty 500

## 2019-04-08 NOTE — Progress Notes (Signed)
Joan [name], [place] paged regarding the pt's Brother requesting an update related to the CT.

## 2019-04-08 NOTE — Progress Notes (Signed)
Physical Therapy Treatment Patient Details Name: Joan [name] MRN: [id number] DOB: 20-Mar-1960 Today's Date: 04/08/2019    History of Present Illness Patient is 59 y.o. female s/p laproscopic appendectomy on 03/31/19 with PMH significant for HTN, hypothyroidism, GERD, COPD, DM, chronic pain, anxiety, depression, and OA. She also had a recent Lt THR on 01/26/19 and continues to be on posterior hip precautions.    PT Comments    Pt reports taking a shower earlier and very eager to ambulate.  Pt assisted with ambulating in hallway short distance and then bathroom.  Pt returned to bed end of session.   Follow Up Recommendations  Home health PT;Supervision for mobility/OOB     Equipment Recommendations  None recommended by PT    Recommendations for Other Services       Precautions / Restrictions Precautions Precautions: Fall;Posterior Hip Precaution Comments: THRev post 12/20, Home Oxygen 2 lts at night; JP drain Restrictions Weight Bearing Restrictions: No    Mobility  Bed Mobility Overal bed mobility: Needs Assistance Bed Mobility: Sit to Supine       Sit to supine: Supervision   General bed mobility comments: pt at EOB  Transfers Overall transfer level: Needs assistance Equipment used: None Transfers: Sit to/from Stand Sit to Stand: Min guard         General transfer comment: min/guard for safety however no physical assist required  Ambulation/Gait Ambulation/Gait assistance: Min guard Gait Distance (Feet): 60 Feet Assistive device: Rolling walker (2 wheeled) Gait Pattern/deviations: Step-through pattern;Decreased stride length;Trunk flexed Gait velocity: decreased   General Gait Details: pt used IV pole for a little support, pt holding abdomen with other hand due to pain, very slow pace, increased weight shifting and lateral sway due to slow pace   Stairs             Wheelchair Mobility    Modified Rankin (Stroke Patients Only)       Balance                                             Cognition Arousal/Alertness: Awake/alert Behavior During Therapy: WFL for tasks assessed/performed Overall Cognitive Status: Within Functional Limits for tasks assessed                                 General Comments: familiar with thps      Exercises      General Comments        Pertinent Vitals/Pain Pain Assessment: 0-10 Pain Score: 5  Faces Pain Scale: Hurts little more Pain Location: abdomen Pain Descriptors / Indicators: Guarding;Grimacing;Discomfort Pain Intervention(s): Monitored during session;Repositioned    Home Living                      Prior Function            PT Goals (current goals can now be found in the care plan section) Progress towards PT goals: Progressing toward goals    Frequency    Min 3X/week      PT Plan Current plan remains appropriate    Co-evaluation              AM-PAC PT "6 Clicks" Mobility   Outcome Measure  Help needed turning from your back to your side while in a flat bed  without using bedrails?: A Little Help needed moving from lying on your back to sitting on the side of a flat bed without using bedrails?: A Little Help needed moving to and from a bed to a chair (including a wheelchair)?: A Little Help needed standing up from a chair using your arms (e.g., wheelchair or bedside chair)?: A Little Help needed to walk in hospital room?: A Little Help needed climbing 3-5 steps with a railing? : A Little 6 Click Score: 18    End of Session   Activity Tolerance: Patient tolerated treatment well Patient left: with call bell/phone within reach;in bed Nurse Communication: Mobility status PT Visit Diagnosis: Difficulty in walking, not elsewhere classified (R26.2)     Time: [id number] PT Time Calculation (min) (ACUTE ONLY): 20 min  Charges:  $Gait Training: 8-22 mins                     Arlyce Dice, DPT Acute Rehabilitation  Services Office: [place] 04/08/2019, 3:25 PM

## 2019-04-08 NOTE — Progress Notes (Signed)
Radiology called and say that there is a long stump of the appendix still present and it is inflamed with a 3.8 cm phlegmon present.  Dr. Thornton Papas - Radiology On Cipro Flagyl since 2/2  Will discuss changing abx.  Anti-infectives (From admission, onward)   Start     Dose/Rate Route Frequency Ordered Stop   03/31/19 0500  ciprofloxacin (CIPRO) IVPB 400 mg     400 mg 200 mL/hr over 60 Minutes Intravenous Every 12 hours 03/30/19 2242     03/31/19 0200  metroNIDAZOLE (FLAGYL) IVPB 500 mg     500 mg 100 mL/hr over 60 Minutes Intravenous Every 8 hours 03/30/19 2242     03/30/19 1630  ciprofloxacin (CIPRO) IVPB 400 mg     400 mg 200 mL/hr over 60 Minutes Intravenous  Once 03/30/19 1620 03/30/19 1914   03/30/19 1630  metroNIDAZOLE (FLAGYL) IVPB 500 mg     500 mg 100 mL/hr over 60 Minutes Intravenous  Once 03/30/19 1620 03/30/19 1914

## 2019-04-08 NOTE — Progress Notes (Signed)
Occupational Therapy Treatment Patient Details Name: Joan [name] MRN: [id number] DOB: [birth year] Today's Date: 04/08/2019    History of present illness Patient is 59 y.o. female s/p laproscopic appendectomy on 03/31/19 with PMH significant for HTN, hypothyroidism, GERD, COPD, DM, chronic pain, anxiety, depression, and OA. She also had a recent Lt THR on 01/26/19 and continues to be on posterior hip precautions.   OT comments  Pt requested to take a shower, and she has an order.  Performed shower and dressing.  Pt needs min A for LB bathing and assist for socks as she is following posterior THPs. While she knows them, she does take a little liberty with them. Pt worked as a Teacher, music Recommendations  None recommended by OT    Recommendations for Other Services      Precautions / Restrictions Precautions Precautions: Fall;Posterior Hip Precaution Comments: THRev post 12/20, Home Oxygen 2 lts at night; JP drain Restrictions Weight Bearing Restrictions: No       Mobility Bed Mobility               General bed mobility comments: pt at EOB  Transfers   Equipment used: None   Sit to Stand: Min guard              Balance                                           ADL either performed or assessed with clinical judgement   ADL                                         General ADL Comments: ambulated to bathroom, shower stall for shower with supervision/min guard assist.  Completed bathing with min A to follow hip precautions, which is takes a little liberty with.  Assist for socks; pt donned gown     Vision       Perception     Praxis      Cognition Arousal/Alertness: Awake/alert Behavior During Therapy: WFL for tasks assessed/performed Overall Cognitive Status: Within Functional Limits for tasks assessed                                 General Comments: familiar with  thps        Exercises     Shoulder Instructions       General Comments      Pertinent Vitals/ Pain       Pain Assessment: 0-10 Pain Score: 5  Faces Pain Scale: Hurts little more Pain Location: abdomen Pain Descriptors / Indicators: Guarding;Grimacing;Discomfort Pain Intervention(s): Monitored during session;Repositioned  Home Living                                          Prior Functioning/Environment              Frequency  Min 2X/week        Progress Toward Goals  OT Goals(current goals can now be found in the care plan section)  Progress towards OT goals: Progressing toward goals  Plan      Co-evaluation                 AM-PAC OT "6 Clicks" Daily Activity     Outcome Measure   Help from another person eating meals?: None Help from another person taking care of personal grooming?: A Little Help from another person toileting, which includes using toliet, bedpan, or urinal?: A Little Help from another person bathing (including washing, rinsing, drying)?: A Little Help from another person to put on and taking off regular upper body clothing?: A Little Help from another person to put on and taking off regular lower body clothing?: A Lot 6 Click Score: 18    End of Session    OT Visit Diagnosis: Unsteadiness on feet (R26.81);Repeated falls (R29.6);Muscle weakness (generalized) (M62.81);Pain   Activity Tolerance Patient tolerated treatment well   Patient Left in chair;with call bell/phone within reach   Nurse Communication          Time: 1240-1309 OT Time Calculation (min): 29 min  Charges: OT General Charges $OT Visit: 1 Visit OT Treatments $Self Care/Home Management : 23-37 mins  [place], OTR/L Acute Rehabilitation Services 04/08/2019   [place] 04/08/2019, 3:22 PM

## 2019-04-08 NOTE — Progress Notes (Signed)
8 Days Post-Op  Subjective: CC: Abdominal pain Patient complaining of abdominal pain and nausea this morning. Pain feels similar to yesterday. She reports that she feels like she has a band across her upper abdomen and pressure of her lower abdomen. She is distended. She reports nausea this morning but no emesis. She was only able to drink some apple juice and water yesterday. She continues to pass flatus and is having dark green diarrhea, 5 episodes in the last 24 hours. Afebrile overnight.   Objective: Vital signs in last 24 hours: Temp:  [97.5 F (36.4 C)-99 F (37.2 C)] 97.5 F (36.4 C) (02/11 0549) Pulse Rate:  [72-78] 78 (02/11 0549) Resp:  [16-19] 17 (02/11 0549) BP: (128-146)/(76-90) 146/90 (02/11 0549) SpO2:  [92 %-95 %] 95 % (02/11 0549) Last BM Date: 04/07/19  Intake/Output from previous day: 02/10 0701 - 02/11 0700 In: 1895.5 [P.O.:700; I.V.:582.5; IV Piggyback:613] Out: 219 [Drains:219] Intake/Output this shift: Total I/O In: -  Out: 45 [Drains:45]  PE: Gen: Alert, NAD Card: RRR  Pulm:Expiratory wheezing of the upper lung fields, no rhonchi or rales.  Abd: Soft,distended, generalized tenderness greatest in the lower abdomen without rigidity or guarding. No peritonitis. Incisions with glue intact are well appearing and are without drainage. Drain isserousw/ 219cc/24 hours. Hyperactive bowel sounds  Skin: no rashes noted, warm and dry  Lab Results:  Recent Labs    04/07/19 0358 04/08/19 0434  WBC 9.5 10.3  HGB 10.3* 10.5*  HCT 33.9* 33.2*  PLT 353 369   BMET Recent Labs    04/07/19 0358 04/08/19 0434  NA 142 145  K 3.3* 3.5  CL 109 111  CO2 27 27  GLUCOSE 114* 117*  BUN <5* 5*  CREATININE 0.81 0.75  CALCIUM 8.1* 8.4*   PT/INR No results for input(s): LABPROT, INR in the last 72 hours. CMP     Component Value Date/Time   NA 145 04/08/2019 0434   NA 140 12/09/2018 1133   K 3.5 04/08/2019 0434   CL 111 04/08/2019 0434   CO2 27  04/08/2019 0434   GLUCOSE 117 (H) 04/08/2019 0434   BUN 5 (L) 04/08/2019 0434   BUN 18 12/09/2018 1133   CREATININE 0.75 04/08/2019 0434   CALCIUM 8.4 (L) 04/08/2019 0434   PROT 7.6 03/31/2019 0417   PROT 6.5 08/19/2017 0822   ALBUMIN 4.3 03/31/2019 0417   ALBUMIN 4.5 08/19/2017 0822   AST 20 03/31/2019 0417   ALT 28 03/31/2019 0417   ALKPHOS 125 03/31/2019 0417   BILITOT 1.2 03/31/2019 0417   BILITOT 0.3 08/19/2017 0822   GFRNONAA >60 04/08/2019 0434   GFRAA >60 04/08/2019 0434   Lipase     Component Value Date/Time   LIPASE 21 03/30/2019 1336       Studies/Results: No results found.  Anti-infectives: Anti-infectives (From admission, onward)   Start     Dose/Rate Route Frequency Ordered Stop   03/31/19 0500  ciprofloxacin (CIPRO) IVPB 400 mg     400 mg 200 mL/hr over 60 Minutes Intravenous Every 12 hours 03/30/19 2242     03/31/19 0200  metroNIDAZOLE (FLAGYL) IVPB 500 mg     500 mg 100 mL/hr over 60 Minutes Intravenous Every 8 hours 03/30/19 2242     03/30/19 1630  ciprofloxacin (CIPRO) IVPB 400 mg     400 mg 200 mL/hr over 60 Minutes Intravenous  Once 03/30/19 1620 03/30/19 1914   03/30/19 1630  metroNIDAZOLE (FLAGYL) IVPB 500 mg  500 mg 100 mL/hr over 60 Minutes Intravenous  Once 03/30/19 1620 03/30/19 1914       Assessment/Plan Severe COPDon home O2 Severe medical comorbidities. Appreciate medical assistance in managing these.  Acute gangrenous appendicitis, perforation Ileus - S/P lap appy, JP drain, Dr. Rosendo Gros, 02/03 - CT scan of AP today Hypokalemia- Resolved.   FEN:NPO VTE: SCD's, lovenox [id number] IVCipro &Flagyl 02/02>>WBC10.3. Afebrile on sch tylenol  Foley:None Follow up:Dr. Rosendo Gros    LOS: 9 days    Jillyn Ledger , [facility] 04/08/2019, 9:12 AM Please see Amion for pager number during day hours 7:00am-4:30pm

## 2019-04-08 NOTE — Progress Notes (Signed)
PROGRESS NOTE    Joan [name]  UJW:[id number] DOB: August 27, 1960 DOA: 03/30/2019 PCP: [name], MD   Brief Narrative: 41 YOF with COPD chronic hypoxic respiratory failure on 2 L [place] at night and 2 to 3 L during exertion in day, hypothyroidism, anxiety/bipolar disorder, diet-controlled diabetes, HTN, HLD admitted with right lower quadrant abdominal pain CT showed acute nonruptured appendicitis status post lap appendectomy 2/3. Hospitalist team following for medical management.  Subjective: Patient is feeling uncomfortable.  Feels bloated and nauseous.  Has upper abdominal pain.  No vomiting. Unable to finish the contrast for CT scan this morning. Overnight afebrile with T-max 99.hemodynamically stable. Bowel movements x5 charted yesterday. Drain output decreased to 290 normal.  Assessment & Plan:   Acute gangrenous appendicitis with perforation: s/p lap appendectomy and Jp drain 03/31/19.  Being managed by general surgery, diet advanced to full liquid diet 2/10.  Today feels distended nauseous with upper abdominal pain.  Patient is at high risk for developing abscess and CT abdomen pelvis has been ordered, patient is unable to finish the contrast. She is afebrile and  no leukocytosis. Continue on IV Cipro and Flagyl.Cont plan as per surgery  Hypokalemia: Was repleted aggressively and this morning improved to 3.5. add 20 meq kcl in iv NSS at 75 ml/hr  Ileus: Patient pulled NG tube over the weekend, no vomiting, passing flatus small bowel.  Replace electrolytes aggressively.  Continue plan as per surgery.  COPD with chronic hypoxic respiratory failure 2 L [place] at night and 2 to 3 L during exertion in day : On Dulera, DuoNeb  as needed bronchodilators.  Respiratory status is stable.  Anxiety/BPD: Continue home Effexor, trazodone, Seroquel  Essential hypertension:controlled.  On metoprolol.  Dyslipidemia-resume statins once tolerating po  Diet-controlled diabetes mellitus: Continue  diet control.  Hypothyroidism: on Synthroid.  GERD without esophagitis- cont ppi  Nutrition: CLD-advance as per CCS  Body mass index is 32.27 kg/m.   DVT prophylaxis: lovenox Code Status:full Family Communication: plan of care discussed with patient at bedside. Disposition Plan:Patient is from:home Anticipated d/c to: Home health versus SNF.   Barriers to d/c or conditions that needs to be met prior to d/c: Awaiting for return of bowel function and, improvement in abdominal pain and distention.Hospitalist team will continue to follow.  Consultants:TRH. Procedures:S/P LAP APPENDECTOMY. Microbiology:see note. Antimicrobials: Anti-infectives (From admission, onward)   Start     Dose/Rate Route Frequency Ordered Stop   03/31/19 0500  ciprofloxacin (CIPRO) IVPB 400 mg     400 mg 200 mL/hr over 60 Minutes Intravenous Every 12 hours 03/30/19 2242     03/31/19 0200  metroNIDAZOLE (FLAGYL) IVPB 500 mg     500 mg 100 mL/hr over 60 Minutes Intravenous Every 8 hours 03/30/19 2242     03/30/19 1630  ciprofloxacin (CIPRO) IVPB 400 mg     400 mg 200 mL/hr over 60 Minutes Intravenous  Once 03/30/19 1620 03/30/19 1914   03/30/19 1630  metroNIDAZOLE (FLAGYL) IVPB 500 mg     500 mg 100 mL/hr over 60 Minutes Intravenous  Once 03/30/19 1620 03/30/19 1914      Medications: Scheduled Meds: . acetaminophen  1,000 mg Oral Q8H  . enoxaparin (LOVENOX) injection  40 mg Subcutaneous Daily  . gabapentin  300 mg Oral BID  . insulin aspart  0-9 Units Subcutaneous Q4H  . iohexol      . ipratropium-albuterol  3 mL Nebulization BID  . levothyroxine  25 mcg Oral Q0600  . metoprolol tartrate  2.5 mg Intravenous Q6H  . mometasone-formoterol  2 puff Inhalation BID  . nicotine  14 mg Transdermal Daily  . pantoprazole  40 mg Oral BID  . QUEtiapine  50 mg Oral BID  . sodium chloride flush  3 mL Intravenous Once  . traZODone  100 mg Oral QHS  . venlafaxine XR  150 mg Oral Q breakfast   Continuous  Infusions: . 0.9 % NaCl with KCl 20 mEq / L    . ciprofloxacin 400 mg (04/08/19 0525)  . metronidazole 500 mg (04/08/19 0219)    Objective: Vitals:   04/07/19 2012 04/07/19 2249 04/08/19 0549 04/08/19 0920  BP:  (!) 144/89 (!) 146/90   Pulse:  78 78   Resp:  19 17   Temp:  99 F (37.2 C) (!) 97.5 F (36.4 C)   TempSrc:  Oral Oral   SpO2: 92% 95% 95% 98%  Weight:      Height:        Intake/Output Summary (Last 24 hours) at 04/08/2019 0944 Last data filed at 04/08/2019 0811 Gross per 24 hour  Intake 1655.54 ml  Output 174 ml  Net 1481.54 ml   Filed Weights   03/30/19 1259 03/31/19 1135  Weight: 90.7 kg 90.7 kg   Weight change:   Body mass index is 32.27 kg/m.  Intake/Output from previous day: 02/10 0701 - 02/11 0700 In: 1895.5 [P.O.:700; I.V.:582.5; IV [id number]:474] Out: 219 [Drains:219] Intake/Output this shift: Total I/O In: -  Out: 45 [Drains:45]  Examination: General exam: Alert awake oriented X3, NAD, nauseous HEENT:Oral mucosa moist, Ear/Nose WNL grossly, dentition normal. Respiratory system: Clear laterally no wheezing or crackles, no use of accessory muscle Cardiovascular system: S1 & S2 +, No JVD,. Gastrointestinal system: Abdomen soft, moderately distended mild diffuse tenderness, bowel sounds sluggish.  Nervous System:Alert, awake, moving extremities and grossly nonfocal Extremities: No edema, distal peripheral pulses palpable.  Skin: No rashes,no icterus. MSK: Normal muscle bulk,tone, power  Data Reviewed: I have personally reviewed following labs and imaging studies  CBC: Recent Labs  Lab 04/04/19 0832 04/05/19 0421 04/06/19 0817 04/07/19 0358 04/08/19 0434  WBC 7.3 8.2 8.3 9.5 10.3  HGB 10.8* 10.6* 11.0* 10.3* 10.5*  HCT 33.8* 33.4* 34.7* 33.9* 33.2*  MCV 95.5 96.5 96.7 97.7 96.8  PLT 276 275 352 353 259   Basic Metabolic Panel: Recent Labs  Lab 04/04/19 0832 04/05/19 0421 04/06/19 0817 04/07/19 0358 04/08/19 0434  NA 142 142  141 142 145  K 2.6* 3.0* 3.3* 3.3* 3.5  CL 103 103 105 109 111  CO2 _0 GLUCOSE 124* 128* 124* 114* 117*  BUN _1 <5* 5*  CREATININE 0.74 0.80 0.92 0.81 0.75  CALCIUM 8.1* 8.2* 8.1* 8.1* 8.4*  MG  --  2.1  --   --   --    GFR: Estimated Creatinine Clearance: 88.1 mL/min (by C-G formula based on SCr of 0.75 mg/dL). Liver Function Tests: No results for input(s): AST, ALT, ALKPHOS, BILITOT, PROT, ALBUMIN in the last 168 hours. No results for input(s): LIPASE, AMYLASE in the last 168 hours. No results for input(s): AMMONIA in the last 168 hours. Coagulation Profile: No results for input(s): INR, PROTIME in the last 168 hours. Cardiac Enzymes: No results for input(s): CKTOTAL, CKMB, CKMBINDEX, TROPONINI in the last 168 hours. BNP (last 3 results) No results for input(s): PROBNP in the last 8760 hours. HbA1C: No results for input(s): HGBA1C in the last 72 hours. CBG: Recent  Labs  Lab 04/07/19 1625 04/07/19 2209 04/08/19 0020 04/08/19 0413 04/08/19 0721  GLUCAP 85 101* 92 113* 129*   Lipid Profile: No results for input(s): CHOL, HDL, LDLCALC, TRIG, CHOLHDL, LDLDIRECT in the last 72 hours. Thyroid Function Tests: No results for input(s): TSH, T4TOTAL, FREET4, T3FREE, THYROIDAB in the last 72 hours. Anemia Panel: No results for input(s): VITAMINB12, FOLATE, FERRITIN, TIBC, IRON, RETICCTPCT in the last 72 hours. Sepsis Labs: No results for input(s): PROCALCITON, LATICACIDVEN in the last 168 hours.  Recent Results (from the past 240 hour(s))  Respiratory Panel by RT PCR (Flu A&B, Covid) - Nasopharyngeal Swab     Status: None   Collection Time: 03/30/19  5:01 PM   Specimen: Nasopharyngeal Swab  Result Value Ref Range Status   SARS Coronavirus 2 by RT PCR NEGATIVE NEGATIVE Final    Comment: (NOTE) SARS-CoV-2 target nucleic acids are NOT DETECTED. The SARS-CoV-2 RNA is generally detectable in upper respiratoy specimens during the acute phase of infection. The  lowest concentration of SARS-CoV-2 viral copies this assay can detect is 131 copies/mL. A negative result does not preclude SARS-Cov-2 infection and should not be used as the sole basis for treatment or other patient management decisions. A negative result may occur with  improper specimen collection/handling, submission of specimen other than nasopharyngeal swab, presence of viral mutation(s) within the areas targeted by this assay, and inadequate number of viral copies (<131 copies/mL). A negative result must be combined with clinical observations, patient history, and epidemiological information. The expected result is Negative. Fact Sheet for Patients:  PinkCheek.be Fact Sheet for Healthcare Providers:  GravelBags.it This test is not yet ap proved or cleared by the [place] FDA and  has been authorized for detection and/or diagnosis of SARS-CoV-2 by FDA under an Emergency Use Authorization (EUA). This EUA will remain  in effect (meaning this test can be used) for the duration of the COVID-19 declaration under Section 564(b)(1) of the Act, 21 U.S.C. section 360bbb-3(b)(1), unless the authorization is terminated or revoked sooner.    Influenza A by PCR NEGATIVE NEGATIVE Final   Influenza B by PCR NEGATIVE NEGATIVE Final    Comment: (NOTE) The Xpert Xpress SARS-CoV-2/FLU/RSV assay is intended as an aid in  the diagnosis of influenza from Nasopharyngeal swab specimens and  should not be used as a sole basis for treatment. Nasal washings and  aspirates are unacceptable for Xpert Xpress SARS-CoV-2/FLU/RSV  testing. Fact Sheet for Patients: PinkCheek.be Fact Sheet for Healthcare Providers: GravelBags.it This test is not yet approved or cleared by the [place] FDA and  has been authorized for detection and/or diagnosis of SARS-CoV-2 by  FDA under an Emergency  Use Authorization (EUA). This EUA will remain  in effect (meaning this test can be used) for the duration of the  Covid-19 declaration under Section 564(b)(1) of the Act, 21  U.S.C. section 360bbb-3(b)(1), unless the authorization is  terminated or revoked. Performed at [facility], [place] [address] [place], [place] [address]       Radiology Studies: No results found.   LOS: 9 days   Time spent: More than 50% of that time was spent in counseling and/or coordination of care.  Antonieta Pert, MD [facility]  04/08/2019, 9:44 AM

## 2019-04-08 NOTE — Progress Notes (Signed)
Pharmacy Antibiotic Note  Joan [name] is a 59 y.o. female admitted on 03/30/2019 with acute gangrenous appendicitis with perforation.   She is on Day#10 Cipro + Flagyl.  Abdominal CT + stump of appendix still inflamed with 3.8 cm phelgmon.   Pharmacy has been consulted for Meropenem dosing. Allergy to cephalexin noted. She is afebrile, WBC WNL, stable renal function.   Plan: Meropenem 1gm IV q8h F/U renal function & clinical course  Height: 5\' 6"  (167.6 cm) Weight: 199 lb 15.3 oz (90.7 kg) IBW/kg (Calculated) : 59.3  Temp (24hrs), Avg:98.3 F (36.8 C), Min:97.5 F (36.4 C), Max:99 F (37.2 C)  Recent Labs  Lab 04/04/19 0832 04/05/19 0421 04/06/19 0817 04/07/19 0358 04/08/19 0434  WBC 7.3 8.2 8.3 9.5 10.3  CREATININE 0.74 0.80 0.92 0.81 0.75    Estimated Creatinine Clearance: 88.1 mL/min (by C-G formula based on SCr of 0.75 mg/dL).    Allergies  Allergen Reactions  . Keflex [Cephalexin] Other (See Comments)    unspecified  . Prednisone Other (See Comments)    "counter reacts"  . Shellfish Allergy Other (See Comments)    Stomach cramps  . Tetracyclines & Related Other (See Comments)    unspecified  . Dilaudid [Hydromorphone Hcl] Other (See Comments)    "Lethargy"  . Incruse Ellipta [Umeclidinium Bromide] Nausea Only  . Tuberculin Tests Other (See Comments)    False postive    Antimicrobials this admission: 2/2 Cipro/Flagyl >> 2/11 2/11 Meropenem >>   Dose adjustments this admission:  Microbiology results: none  Thank you for allowing pharmacy to be a part of this patient's care.  Netta Cedars, PharmD, BCPS 04/08/2019 1:26 PM

## 2019-04-09 ENCOUNTER — Ambulatory Visit (HOSPITAL_COMMUNITY): Payer: Medicare Other | Admitting: Psychiatry

## 2019-04-09 LAB — CBC
HCT: 38 % (ref 36.0–46.0)
Hemoglobin: 11.6 g/dL — ABNORMAL LOW (ref 12.0–15.0)
MCH: 30 pg (ref 26.0–34.0)
MCHC: 30.5 g/dL (ref 30.0–36.0)
MCV: 98.2 fL (ref 80.0–100.0)
Platelets: 402 10*3/uL — ABNORMAL HIGH (ref 150–400)
RBC: 3.87 MIL/uL (ref 3.87–5.11)
RDW: 14.4 % (ref 11.5–15.5)
WBC: 12.2 10*3/uL — ABNORMAL HIGH (ref 4.0–10.5)
nRBC: 0 % (ref 0.0–0.2)

## 2019-04-09 LAB — BASIC METABOLIC PANEL
Anion gap: 7 (ref 5–15)
BUN: <5 mg/dL — ABNORMAL LOW (ref 6–20)
CO2: 23 mmol/L (ref 22–32)
Calcium: 8.3 mg/dL — ABNORMAL LOW (ref 8.9–10.3)
Chloride: 110 mmol/L (ref 98–111)
Creatinine, Ser: 0.73 mg/dL (ref 0.44–1.00)
GFR calc Af Amer: >60 mL/min (ref >60)
GFR calc non Af Amer: >60 mL/min (ref >60)
Glucose, Bld: 136 mg/dL — ABNORMAL HIGH (ref 70–99)
Potassium: 3.8 mmol/L (ref 3.5–5.1)
Sodium: 140 mmol/L (ref 135–145)

## 2019-04-09 LAB — GLUCOSE, CAPILLARY
Glucose-Capillary: 106 mg/dL — ABNORMAL HIGH (ref 70–99)
Glucose-Capillary: 126 mg/dL — ABNORMAL HIGH (ref 70–99)
Glucose-Capillary: 140 mg/dL — ABNORMAL HIGH (ref 70–99)
Glucose-Capillary: 85 mg/dL (ref 70–99)
Glucose-Capillary: 97 mg/dL (ref 70–99)
Glucose-Capillary: 99 mg/dL (ref 70–99)

## 2019-04-09 MED ORDER — SACCHAROMYCES BOULARDII 250 MG PO CAPS
250.0000 mg | ORAL_CAPSULE | Freq: Two times a day (BID) | ORAL | Status: DC
  Administered 2019-04-09 – 2019-04-14 (×11): 250 mg via ORAL
  Filled 2019-04-09 (×11): qty 1

## 2019-04-09 MED ORDER — PIPERACILLIN-TAZOBACTAM 3.375 G IVPB 30 MIN
3.3750 g | Freq: Once | INTRAVENOUS | Status: AC
  Administered 2019-04-09: 3.375 g via INTRAVENOUS
  Filled 2019-04-09 (×2): qty 50

## 2019-04-09 MED ORDER — PIPERACILLIN-TAZOBACTAM 3.375 G IVPB
3.3750 g | Freq: Three times a day (TID) | INTRAVENOUS | Status: DC
  Administered 2019-04-09 – 2019-04-13 (×11): 3.375 g via INTRAVENOUS
  Filled 2019-04-09 (×11): qty 50

## 2019-04-09 NOTE — Progress Notes (Signed)
Physical Therapy Treatment Patient Details Name: Joan [name] MRN: [id number] DOB: 1960-12-14 Today's Date: 04/09/2019    History of Present Illness Patient is 59 y.o. female s/p laproscopic appendectomy on 03/31/19 with PMH significant for HTN, hypothyroidism, GERD, COPD, DM, chronic pain, anxiety, depression, and OA. She also had a recent Lt THR on 01/26/19 and continues to be on posterior hip precautions.    PT Comments    Pt assisted with bathroom and then ambulated in hallway.  Pt tolerated improved distance.  Pt also with improved pace and posture today.    Follow Up Recommendations  Home health PT;Supervision for mobility/OOB     Equipment Recommendations  None recommended by PT    Recommendations for Other Services       Precautions / Restrictions Precautions Precautions: Fall;Posterior Hip Precaution Comments: THRev post 12/20, Home Oxygen 2 lts at night; JP drain Restrictions Weight Bearing Restrictions: No    Mobility  Bed Mobility Overal bed mobility: Needs Assistance Bed Mobility: Supine to Sit     Supine to sit: Supervision     General bed mobility comments: EOB  Transfers Overall transfer level: Needs assistance Equipment used: None Transfers: Sit to/from Stand Sit to Stand: Min guard         General transfer comment: min/guard for safety however no physical assist required  Ambulation/Gait Ambulation/Gait assistance: Min guard Gait Distance (Feet): 120 Feet Assistive device: IV Pole Gait Pattern/deviations: Step-through pattern;Decreased stride length;Trunk flexed Gait velocity: decreased   General Gait Details: pt used IV pole for a little support, pt with improve posture and pace today   Stairs             Wheelchair Mobility    Modified Rankin (Stroke Patients Only)       Balance                                            Cognition Arousal/Alertness: Awake/alert Behavior During Therapy: WFL for tasks  assessed/performed Overall Cognitive Status: Within Functional Limits for tasks assessed                                        Exercises      General Comments        Pertinent Vitals/Pain Pain Assessment: 0-10 Faces Pain Scale: Hurts little more Pain Location: abdomen Pain Descriptors / Indicators: Sore Pain Intervention(s): Monitored during session;Repositioned    Home Living                      Prior Function            PT Goals (current goals can now be found in the care plan section) Progress towards PT goals: Progressing toward goals    Frequency    Min 3X/week      PT Plan Current plan remains appropriate    Co-evaluation              AM-PAC PT "6 Clicks" Mobility   Outcome Measure  Help needed turning from your back to your side while in a flat bed without using bedrails?: A Little Help needed moving from lying on your back to sitting on the side of a flat bed without using bedrails?: A Little Help needed moving to and from  a bed to a chair (including a wheelchair)?: A Little Help needed standing up from a chair using your arms (e.g., wheelchair or bedside chair)?: A Little Help needed to walk in hospital room?: A Little Help needed climbing 3-5 steps with a railing? : A Little 6 Click Score: 18    End of Session   Activity Tolerance: Patient tolerated treatment well Patient left: with call bell/phone within reach;in bed(with OT at EOB)   PT Visit Diagnosis: Difficulty in walking, not elsewhere classified (R26.2)     Time: [id number] PT Time Calculation (min) (ACUTE ONLY): 19 min  Charges:  $Gait Training: 8-22 mins                    Arlyce Dice, DPT Acute Rehabilitation Services Office: [name] 04/09/2019, 3:32 PM

## 2019-04-09 NOTE — Progress Notes (Signed)
9 Days Post-Op  Subjective: CC: Patient with continued lower abdominal pain this morning but less severe compared to yesterday. Tolerating cld without n/v. Passing flatus. Had several loose BM's yesterday.   Objective: Vital signs in last 24 hours: Temp:  [97.3 F (36.3 C)-98.1 F (36.7 C)] 97.6 F (36.4 C) (02/12 0534) Pulse Rate:  [74-89] 75 (02/12 0534) Resp:  [16-20] 16 (02/12 0534) BP: (125-151)/(76-98) 125/77 (02/12 0534) SpO2:  [94 %-98 %] 96 % (02/12 0534) Last BM Date: 04/08/19  Intake/Output from previous day: 02/11 0701 - 02/12 0700 In: 2995.2 [P.O.:1080; I.V.:1548.6; IV Piggyback:366.5] Out: 3035 [Urine:2750; Drains:285] Intake/Output this shift: Total I/O In: -  Out: 540 [Urine:500; Drains:40]  PE: Gen: Alert, NAD Card: RRR Pulm:Expiratory wheezing of the upper lung fields, no rhonchi or rales.  [id number], generalized tenderness greatest in the lower abdomen without rigidity or guarding. No peritonitis. Incisions with glue intact are well appearing and are without drainage. Drain isserousw/ 219cc/24 hours. Hyperactive bowel sounds  Skin: no rashes noted, warm and dry  Lab Results:  Recent Labs    04/08/19 0434 04/09/19 0350  WBC 10.3 12.2*  HGB 10.5* 11.6*  HCT 33.2* 38.0  PLT 369 402*   BMET Recent Labs    04/08/19 0434 04/09/19 0350  NA 145 140  K 3.5 3.8  CL 111 110  CO2 27 23  GLUCOSE 117* 136*  BUN 5* <5*  CREATININE 0.75 0.73  CALCIUM 8.4* 8.3*   PT/INR No results for input(s): LABPROT, INR in the last 72 hours. CMP     Component Value Date/Time   NA 140 04/09/2019 0350   NA 140 12/09/2018 1133   K 3.8 04/09/2019 0350   CL 110 04/09/2019 0350   CO2 23 04/09/2019 0350   GLUCOSE 136 (H) 04/09/2019 0350   BUN <5 (L) 04/09/2019 0350   BUN 18 12/09/2018 1133   CREATININE 0.73 04/09/2019 0350   CALCIUM 8.3 (L) 04/09/2019 0350   PROT 7.6 03/31/2019 0417   PROT 6.5 08/19/2017 0822   ALBUMIN 4.3 03/31/2019 0417     ALBUMIN 4.5 08/19/2017 0822   AST 20 03/31/2019 0417   ALT 28 03/31/2019 0417   ALKPHOS 125 03/31/2019 0417   BILITOT 1.2 03/31/2019 0417   BILITOT 0.3 08/19/2017 0822   GFRNONAA >60 04/09/2019 0350   GFRAA >60 04/09/2019 0350   Lipase     Component Value Date/Time   LIPASE 21 03/30/2019 1336       Studies/Results: CT ABDOMEN PELVIS W CONTRAST  Result Date: 04/08/2019 CLINICAL DATA:  RIGHT lower quadrant pain, appendicitis by prior CT post laparoscopic appendectomy on 03/31/2019, has a drain, question abscess/infection EXAM: CT ABDOMEN AND PELVIS WITH CONTRAST TECHNIQUE: Multidetector CT imaging of the abdomen and pelvis was performed using the standard protocol following bolus administration of intravenous contrast. Sagittal and coronal MPR images reconstructed from axial data set. CONTRAST:  174mL OMNIPAQUE IOHEXOL 300 MG/ML SOLN IV. Dilute oral contrast. COMPARISON:  03/30/2019 FINDINGS: Lower chest: Lung bases clear Hepatobiliary: Gallbladder liver normal appearance Pancreas: Normal appearance Spleen: Calcified granulomata within spleen. No focal splenic lesions. Adrenals/Urinary Tract: Adrenal glands, kidneys, visualized ureters, and bladder normal appearance. Beam hardening artifacts in pelvis obscure portions of the distal ureters. Stomach/Bowel: Inflammatory process at the RIGHT mid abdomen at site of prior appendicitis. Long retrocecal appendiceal stump is identified which demonstrates mild wall thickening. Infiltrative changes are seen adjacent to the appendiceal stump compatible with phlegmon/inflammation; this does not to be appear to be  a well-defined drainable abscess collection at this point. Phlegmon measures approximately 3.8 x 3.2 cm. Mild surrounding infiltrative changes with fluid extending to lateral conal fascia. No extraluminal gas. Diffuse dilatation of small bowel loops suggesting postoperative ileus. Colon decompressed. Stomach unremarkable. Vascular/Lymphatic:  Atherosclerotic calcifications aorta and iliac arteries without aneurysm. No adenopathy. Reproductive: Unremarkable uterus and adnexa Other: Surgical drain in mid abdomen/pelvis. No free air. No hernia. Musculoskeletal: Prior lumbar fusion.  BILATERAL hip prostheses. IMPRESSION: Long retrocecal appendiceal stump with inflammatory changes and phlegmon adjacent to the distal stump representing sequela of prior appendicitis, stump appendicitis, or stump breakdown. Phlegmon measures approximately 3.8 x 3.2 cm without definite well-defined borders to suggest abscess at this time. No extraluminal gas. Postoperative ileus. Findings called to [employer] PA on 04/08/2019 at 1235 hours. Electronically Signed   By: Lavonia Dana M.D.   On: 04/08/2019 12:36    Anti-infectives: Anti-infectives (From admission, onward)   Start     Dose/Rate Route Frequency Ordered Stop   04/08/19 1400  meropenem (MERREM) 1 g in sodium chloride 0.9 % 100 mL IVPB     1 g 200 mL/hr over 30 Minutes Intravenous Every 8 hours 04/08/19 1335     03/31/19 0500  ciprofloxacin (CIPRO) IVPB 400 mg  Status:  Discontinued     400 mg 200 mL/hr over 60 Minutes Intravenous Every 12 hours 03/30/19 2242 04/08/19 1320   03/31/19 0200  metroNIDAZOLE (FLAGYL) IVPB 500 mg  Status:  Discontinued     500 mg 100 mL/hr over 60 Minutes Intravenous Every 8 hours 03/30/19 2242 04/08/19 1320   03/30/19 1630  ciprofloxacin (CIPRO) IVPB 400 mg     400 mg 200 mL/hr over 60 Minutes Intravenous  Once 03/30/19 1620 03/30/19 1914   03/30/19 1630  metroNIDAZOLE (FLAGYL) IVPB 500 mg     500 mg 100 mL/hr over 60 Minutes Intravenous  Once 03/30/19 1620 03/30/19 1914       Assessment/Plan Severe COPDon home O2 Severe medical comorbidities. Appreciate medical assistance in managing these.  Acute gangrenous appendicitis, perforation - S/P lap appy, JP drain, Dr. Rosendo Gros, 02/03 - POD #9 - CT 2/11 w/ long retrocecal appendiceal stump with 3.8x 3.2cm  phlegmon - Cont IV abx. Changed to [facility] 2/11 - Monitor WBC. May need repeat CT scan early next week to see if phlegmon developing into abscess - Ileus resolving. Start on FLD. Allergic to Glucerna shakes  - Monitor diarrhea. Start on probiotics  - Mobilize and IS   FEN:FLD  VTE: SCD's, lovenox [id number] &Flagyl 02/02 - 2/11. Merrem 2/11 >>WBCup at 12.2. Afebrile on scheduled tylenol  Foley:None Follow up:Dr. Rosendo Gros   LOS: 10 days    Jillyn Ledger , [facility] 04/09/2019, 9:08 AM Please see Amion for pager number during day hours 7:00am-4:30pm

## 2019-04-09 NOTE — Progress Notes (Signed)
Occupational Therapy Treatment Patient Details Name: Joan [name] MRN: [id number] DOB: 1960-05-28 Today's Date: 04/09/2019    History of present illness Patient is 59 y.o. female s/p laproscopic appendectomy on 03/31/19 with PMH significant for HTN, hypothyroidism, GERD, COPD, DM, chronic pain, anxiety, depression, and OA. She also had a recent Lt THR on 01/26/19 and continues to be on posterior hip precautions.   OT comments  Worked with AE; pt has this at home and did not need any cues.  Performed grooming from sitting as she was fatiqued after walking with PT.  Follow Up Recommendations  Home health OT;Supervision - Intermittent    Equipment Recommendations  None recommended by OT    Recommendations for Other Services      Precautions / Restrictions Precautions Precautions: Fall;Posterior Hip Precaution Comments: THRev post 12/20, Home Oxygen 2 lts at night; JP drain Restrictions Weight Bearing Restrictions: No       Mobility Bed Mobility               General bed mobility comments: EOB  Transfers   Equipment used: None   Sit to Stand: Min guard;Supervision         General transfer comment: for safety    Balance                                           ADL either performed or assessed with clinical judgement   ADL                       Lower Body Dressing: Min guard;Sit to/from stand;With adaptive equipment                 General ADL Comments: used reacher and sock aide without cues.  Min guard to stand.  Pt performed grooming from sitting with set up; she had just walked with PT     Vision       Perception     Praxis      Cognition Arousal/Alertness: Awake/alert Behavior During Therapy: WFL for tasks assessed/performed Overall Cognitive Status: Within Functional Limits for tasks assessed                                          Exercises     Shoulder Instructions       General  Comments      Pertinent Vitals/ Pain       Faces Pain Scale: Hurts little more Pain Location: abdomen Pain Descriptors / Indicators: Sore Pain Intervention(s): Limited activity within patient's tolerance;Monitored during session  Home Living                                          Prior Functioning/Environment              Frequency  Min 2X/week        Progress Toward Goals  OT Goals(current goals can now be found in the care plan section)  Progress towards OT goals: Progressing toward goals     Plan      Co-evaluation                 AM-PAC  OT "6 Clicks" Daily Activity     Outcome Measure   Help from another person eating meals?: None Help from another person taking care of personal grooming?: A Little Help from another person toileting, which includes using toliet, bedpan, or urinal?: A Little Help from another person bathing (including washing, rinsing, drying)?: A Little Help from another person to put on and taking off regular upper body clothing?: A Little Help from another person to put on and taking off regular lower body clothing?: A Little 6 Click Score: 19    End of Session    OT Visit Diagnosis: Unsteadiness on feet (R26.81);Repeated falls (R29.6);Muscle weakness (generalized) (M62.81);Pain   Activity Tolerance Patient limited by fatigue   Patient Left in bed;with call bell/phone within reach   Nurse Communication          Time: [id number] OT Time Calculation (min): 20 min  Charges: OT General Charges $OT Visit: 1 Visit OT Treatments $Self Care/Home Management : 8-22 mins  [employer], OTR/L Acute Rehabilitation Services 04/09/2019   [place] 04/09/2019, 3:14 PM

## 2019-04-09 NOTE — Progress Notes (Signed)
PROGRESS NOTE    Joan [name]  [id number] DOB: Jul 12, 1960 DOA: 03/30/2019 PCP: [name], MD   Brief Narrative: 34 YOF with COPD chronic hypoxic respiratory failure on 2 L [place] at night and 2 to 3 L during exertion in day, hypothyroidism, anxiety/bipolar disorder, diet-controlled diabetes, HTN, HLD admitted with right lower quadrant abdominal pain CT showed acute nonruptured appendicitis status post lap appendectomy 2/3. Hospitalist team following for medical management.  Subjective:  Reports multiple loose bowel movement.  Abdomen is less painful and bloated today.  Tolerating clear liquid diet.  Assessment & Plan:   Acute gangrenous appendicitis with perforation: s/p lap appendectomy and Jp drain 03/31/19-pod #9 . Mild leukocytosis present today and had a CT abdomen due to ongoing abdominal pain 2/12 and that showed phlegmon-antibiotics changed to meropenem 2/11.  Monitor WBC count temperature curve and if has ongoing pain may need CT abdomen repeated to make sure phlegmon does not develop into abscess.  Discussed with surgery team today.  Diet advanced to  FLD.Defer ongoing postop plan to surgery. Encourage mobilization, pt/ot.  Hypokalemia:Cont ivf.   Ileus: Patient pulled NG tube over the weekend, no vomiting, passing flatus small bowel.  Replace electrolytes aggressively.  Continue plan as per surgery.  COPD with chronic hypoxic respiratory failure 2 L [place] at night and 2 to 3 L during exertion in day : On Dulera, DuoNeb  as needed bronchodilators.  Respiratory status is stable.  Anxiety/BPD: Continue home Effexor, trazodone, Seroquel  Essential hypertension:controlled.  On metoprolol.  Dyslipidemia-resume statins once tolerating po  Diet-controlled diabetes mellitus: Continue diet control.  Hypothyroidism: on Synthroid.  GERD without esophagitis- cont ppi  Nutrition: CLD-advance to FLD 2/12  Body mass index is 32.27 kg/m.   DVT prophylaxis: lovenox Code  Status:full Family Communication: plan of care discussed with patient at bedside. Disposition Plan:Patient is from:home Anticipated d/c to: Home health versus SNF next 2 to 4 days.   Barriers to d/c or conditions that needs to be met prior to d/c: Awaiting diet tolerance return of bowel function and stable postop course.Hospitalist team will continue to follow.  Consultants:TRH. Procedures:S/P LAP APPENDECTOMY. Microbiology:see note. Antimicrobials: Anti-infectives (From admission, onward)   Start     Dose/Rate Route Frequency Ordered Stop   04/08/19 1400  meropenem (MERREM) 1 g in sodium chloride 0.9 % 100 mL IVPB     1 g 200 mL/hr over 30 Minutes Intravenous Every 8 hours 04/08/19 1335     03/31/19 0500  ciprofloxacin (CIPRO) IVPB 400 mg  Status:  Discontinued     400 mg 200 mL/hr over 60 Minutes Intravenous Every 12 hours 03/30/19 2242 04/08/19 1320   03/31/19 0200  metroNIDAZOLE (FLAGYL) IVPB 500 mg  Status:  Discontinued     500 mg 100 mL/hr over 60 Minutes Intravenous Every 8 hours 03/30/19 2242 04/08/19 1320   03/30/19 1630  ciprofloxacin (CIPRO) IVPB 400 mg     400 mg 200 mL/hr over 60 Minutes Intravenous  Once 03/30/19 1620 03/30/19 1914   03/30/19 1630  metroNIDAZOLE (FLAGYL) IVPB 500 mg     500 mg 100 mL/hr over 60 Minutes Intravenous  Once 03/30/19 1620 03/30/19 1914      Medications: Scheduled Meds: . acetaminophen  1,000 mg Oral Q8H  . enoxaparin (LOVENOX) injection  40 mg Subcutaneous Daily  . gabapentin  300 mg Oral BID  . insulin aspart  0-9 Units Subcutaneous Q4H  . ipratropium-albuterol  3 mL Nebulization BID  . levothyroxine  25 mcg  Oral Q0600  . metoprolol tartrate  2.5 mg Intravenous Q6H  . mometasone-formoterol  2 puff Inhalation BID  . nicotine  14 mg Transdermal Daily  . pantoprazole  40 mg Oral BID  . QUEtiapine  50 mg Oral BID  . saccharomyces boulardii  250 mg Oral BID  . sodium chloride flush  3 mL Intravenous Once  . traZODone  100 mg Oral  QHS  . venlafaxine XR  150 mg Oral Q breakfast   Continuous Infusions: . 0.9 % NaCl with KCl 20 mEq / L 75 mL/hr at 04/09/19 0358  . meropenem (MERREM) IV 1 g (04/09/19 0559)    Objective: Vitals:   04/08/19 2111 04/08/19 2355 04/09/19 0534 04/09/19 0943  BP: 134/79 (!) 151/82 125/77   Pulse: 79 80 75   Resp: 20  16   Temp: 98 F (36.7 C)  97.6 F (36.4 C)   TempSrc: Oral  Oral   SpO2: 97%  96% 92%  Weight:      Height:        Intake/Output Summary (Last 24 hours) at 04/09/2019 0956 Last data filed at 04/09/2019 0935 Gross per 24 hour  Intake 2779.71 ml  Output 3530 ml  Net -750.29 ml   Filed Weights   03/30/19 1259 03/31/19 1135  Weight: 90.7 kg 90.7 kg   Weight change:   Body mass index is 32.27 kg/m.  Intake/Output from previous day: 02/11 0701 - 02/12 0700 In: 2995.2 [P.O.:1080; I.V.:1548.6; IV Piggyback:366.5] Out: 3035 [Urine:2750; Drains:285] Intake/Output this shift: Total I/O In: 60 [P.O.:60] Out: 540 [Urine:500; Drains:40]  Examination:  General exam: AAOx3,NAD, weak appearing. HEENT:Oral mucosa moist, Ear/Nose WNL grossly, dentition normal. Respiratory system: bilaterally clear,no wheezing or crackles,no use of accessory muscle Cardiovascular system: S1 & S2 +, No JVD,. Gastrointestinal system: Abdomen soft, mildly tender, mildly distended, BS+ Nervous System:Alert, awake, moving extremities and grossly nonfocal Extremities: No edema, distal peripheral pulses palpable.  Skin: No rashes,no icterus. MSK: Normal muscle bulk,tone, power  Data Reviewed: I have personally reviewed following labs and imaging studies  CBC: Recent Labs  Lab 04/05/19 0421 04/06/19 0817 04/07/19 0358 04/08/19 0434 04/09/19 0350  WBC 8.2 8.3 9.5 10.3 12.2*  HGB 10.6* 11.0* 10.3* 10.5* 11.6*  HCT 33.4* 34.7* 33.9* 33.2* 38.0  MCV 96.5 96.7 97.7 96.8 98.2  PLT 275 352 353 369 027*   Basic Metabolic Panel: Recent Labs  Lab 04/05/19 0421 04/06/19 0817  04/07/19 0358 04/08/19 0434 04/09/19 0350  NA 142 141 142 145 140  K 3.0* 3.3* 3.3* 3.5 3.8  CL 103 105 109 111 110  CO2 30 30 27 27 23   GLUCOSE 128* 124* 114* 117* 136*  BUN 11 7 <5* 5* <5*  CREATININE 0.80 0.92 0.81 0.75 0.73  CALCIUM 8.2* 8.1* 8.1* 8.4* 8.3*  MG 2.1  --   --   --   --    GFR: Estimated Creatinine Clearance: 88.1 mL/min (by C-G formula based on SCr of 0.73 mg/dL). Liver Function Tests: No results for input(s): AST, ALT, ALKPHOS, BILITOT, PROT, ALBUMIN in the last 168 hours. No results for input(s): LIPASE, AMYLASE in the last 168 hours. No results for input(s): AMMONIA in the last 168 hours. Coagulation Profile: No results for input(s): INR, PROTIME in the last 168 hours. Cardiac Enzymes: No results for input(s): CKTOTAL, CKMB, CKMBINDEX, TROPONINI in the last 168 hours. BNP (last 3 results) No results for input(s): PROBNP in the last 8760 hours. HbA1C: No results for input(s): HGBA1C in  the last 72 hours. CBG: Recent Labs  Lab 04/08/19 1947 04/08/19 2113 04/08/19 2352 04/09/19 0351 04/09/19 0726  GLUCAP 122* 92 92 126* 97   Lipid Profile: No results for input(s): CHOL, HDL, LDLCALC, TRIG, CHOLHDL, LDLDIRECT in the last 72 hours. Thyroid Function Tests: No results for input(s): TSH, T4TOTAL, FREET4, T3FREE, THYROIDAB in the last 72 hours. Anemia Panel: No results for input(s): VITAMINB12, FOLATE, FERRITIN, TIBC, IRON, RETICCTPCT in the last 72 hours. Sepsis Labs: No results for input(s): PROCALCITON, LATICACIDVEN in the last 168 hours.  Recent Results (from the past 240 hour(s))  Respiratory Panel by RT PCR (Flu A&B, Covid) - Nasopharyngeal Swab     Status: None   Collection Time: 03/30/19  5:01 PM   Specimen: Nasopharyngeal Swab  Result Value Ref Range Status   SARS Coronavirus 2 by RT PCR NEGATIVE NEGATIVE Final    Comment: (NOTE) SARS-CoV-2 target nucleic acids are NOT DETECTED. The SARS-CoV-2 RNA is generally detectable in upper  respiratoy specimens during the acute phase of infection. The lowest concentration of SARS-CoV-2 viral copies this assay can detect is 131 copies/mL. A negative result does not preclude SARS-Cov-2 infection and should not be used as the sole basis for treatment or other patient management decisions. A negative result may occur with  improper specimen collection/handling, submission of specimen other than nasopharyngeal swab, presence of viral mutation(s) within the areas targeted by this assay, and inadequate number of viral copies (<131 copies/mL). A negative result must be combined with clinical observations, patient history, and epidemiological information. The expected result is Negative. Fact Sheet for Patients:  PinkCheek.be Fact Sheet for Healthcare Providers:  GravelBags.it This test is not yet ap proved or cleared by the [place] FDA and  has been authorized for detection and/or diagnosis of SARS-CoV-2 by FDA under an Emergency Use Authorization (EUA). This EUA will remain  in effect (meaning this test can be used) for the duration of the COVID-19 declaration under Section 564(b)(1) of the Act, 21 U.S.C. section 360bbb-3(b)(1), unless the authorization is terminated or revoked sooner.    Influenza A by PCR NEGATIVE NEGATIVE Final   Influenza B by PCR NEGATIVE NEGATIVE Final    Comment: (NOTE) The Xpert Xpress SARS-CoV-2/FLU/RSV assay is intended as an aid in  the diagnosis of influenza from Nasopharyngeal swab specimens and  should not be used as a sole basis for treatment. Nasal washings and  aspirates are unacceptable for Xpert Xpress SARS-CoV-2/FLU/RSV  testing. Fact Sheet for Patients: PinkCheek.be Fact Sheet for Healthcare Providers: GravelBags.it This test is not yet approved or cleared by the [place] FDA and  has been authorized for  detection and/or diagnosis of SARS-CoV-2 by  FDA under an Emergency Use Authorization (EUA). This EUA will remain  in effect (meaning this test can be used) for the duration of the  Covid-19 declaration under Section 564(b)(1) of the Act, 21  U.S.C. section 360bbb-3(b)(1), unless the authorization is  terminated or revoked. Performed at [facility], [place] [address] [place], [place] [address]       Radiology Studies: CT ABDOMEN PELVIS W CONTRAST  Result Date: 04/08/2019 CLINICAL DATA:  RIGHT lower quadrant pain, appendicitis by prior CT post laparoscopic appendectomy on 03/31/2019, has a drain, question abscess/infection EXAM: CT ABDOMEN AND PELVIS WITH CONTRAST TECHNIQUE: Multidetector CT imaging of the abdomen and pelvis was performed using the standard protocol following bolus administration of intravenous contrast. Sagittal and coronal MPR images reconstructed from axial data set. CONTRAST:  122m  OMNIPAQUE IOHEXOL 300 MG/ML SOLN IV. Dilute oral contrast. COMPARISON:  03/30/2019 FINDINGS: Lower chest: Lung bases clear Hepatobiliary: Gallbladder liver normal appearance Pancreas: Normal appearance Spleen: Calcified granulomata within spleen. No focal splenic lesions. Adrenals/Urinary Tract: Adrenal glands, kidneys, visualized ureters, and bladder normal appearance. Beam hardening artifacts in pelvis obscure portions of the distal ureters. Stomach/Bowel: Inflammatory process at the RIGHT mid abdomen at site of prior appendicitis. Long retrocecal appendiceal stump is identified which demonstrates mild wall thickening. Infiltrative changes are seen adjacent to the appendiceal stump compatible with phlegmon/inflammation; this does not to be appear to be a well-defined drainable abscess collection at this point. Phlegmon measures approximately 3.8 x 3.2 cm. Mild surrounding infiltrative changes with fluid extending to lateral conal fascia. No extraluminal gas. Diffuse dilatation of  small bowel loops suggesting postoperative ileus. Colon decompressed. Stomach unremarkable. Vascular/Lymphatic: Atherosclerotic calcifications aorta and iliac arteries without aneurysm. No adenopathy. Reproductive: Unremarkable uterus and adnexa Other: Surgical drain in mid abdomen/pelvis. No free air. No hernia. Musculoskeletal: Prior lumbar fusion.  BILATERAL hip prostheses. IMPRESSION: Long retrocecal appendiceal stump with inflammatory changes and phlegmon adjacent to the distal stump representing sequela of prior appendicitis, stump appendicitis, or stump breakdown. Phlegmon measures approximately 3.8 x 3.2 cm without definite well-defined borders to suggest abscess at this time. No extraluminal gas. Postoperative ileus. Findings called to [employer] PA on 04/08/2019 at 1235 hours. Electronically Signed   By: Lavonia Dana M.D.   On: 04/08/2019 12:36     LOS: 10 days   Time spent: More than 50% of that time was spent in counseling and/or coordination of care.  Antonieta Pert, MD [facility]  04/09/2019, 9:56 AM

## 2019-04-09 NOTE — TOC Progression Note (Signed)
Transition of Care [facility]) - Progression Note    Patient Details  Name: RENELLA SAUCEDA MRN: [id number] Date of Birth: 1960/02/26  [facility] CM/SW [place], [place] Phone Number: 04/09/2019, 1:54 PM  Clinical Narrative:    CSW provide choice and reached out to [place]. [place]. The agency will accept and provide PT/OT at discharge.    Expected Discharge Plan: [place] Barriers to Discharge: Continued Medical Work up  Expected Discharge Plan and Services Expected Discharge Plan: [place]   Discharge Planning Services: CM Consult Post Acute Care Choice: [place] arrangements for the past 2 months: Single Family Home                           HH Arranged: PT, OT [facility] Agency: [facility] Date [place]: 04/09/19 Time [place]: [id number] Representative spoke with at [place]: [place] ([place]) Interventions    Readmission Risk Interventions Readmission Risk Prevention Plan 04/07/2019  Transportation Screening Complete  PCP or Specialist Appt within 3-5 Days Complete  HRI or [place] Complete  Social Work Consult for [place] Planning/Counseling Complete  Palliative Care Screening Not Applicable  Medication Review Press photographer) Complete  Some recent data might be hidden

## 2019-04-09 NOTE — Care Management Important Message (Signed)
Important Message  Patient Details IM Letter given to [place] Case Manager to present to the Patient Name: MARTICA OTTS MRN: [id number] Date of Birth: Sep 23, 1960   Medicare Important Message Given:  Yes     Kerin Salen 04/09/2019, 1:05 PM

## 2019-04-10 LAB — BASIC METABOLIC PANEL
Anion gap: 8 (ref 5–15)
BUN: 6 mg/dL (ref 6–20)
CO2: 24 mmol/L (ref 22–32)
Calcium: 7.9 mg/dL — ABNORMAL LOW (ref 8.9–10.3)
Chloride: 110 mmol/L (ref 98–111)
Creatinine, Ser: 0.81 mg/dL (ref 0.44–1.00)
GFR calc Af Amer: >60 mL/min (ref >60)
GFR calc non Af Amer: >60 mL/min (ref >60)
Glucose, Bld: 111 mg/dL — ABNORMAL HIGH (ref 70–99)
Potassium: 3.8 mmol/L (ref 3.5–5.1)
Sodium: 142 mmol/L (ref 135–145)

## 2019-04-10 LAB — GLUCOSE, CAPILLARY
Glucose-Capillary: 109 mg/dL — ABNORMAL HIGH (ref 70–99)
Glucose-Capillary: 112 mg/dL — ABNORMAL HIGH (ref 70–99)
Glucose-Capillary: 112 mg/dL — ABNORMAL HIGH (ref 70–99)
Glucose-Capillary: 113 mg/dL — ABNORMAL HIGH (ref 70–99)
Glucose-Capillary: 116 mg/dL — ABNORMAL HIGH (ref 70–99)
Glucose-Capillary: 136 mg/dL — ABNORMAL HIGH (ref 70–99)

## 2019-04-10 LAB — CBC
HCT: 32.5 % — ABNORMAL LOW (ref 36.0–46.0)
Hemoglobin: 9.9 g/dL — ABNORMAL LOW (ref 12.0–15.0)
MCH: 30.2 pg (ref 26.0–34.0)
MCHC: 30.5 g/dL (ref 30.0–36.0)
MCV: 99.1 fL (ref 80.0–100.0)
Platelets: 362 10*3/uL (ref 150–400)
RBC: 3.28 MIL/uL — ABNORMAL LOW (ref 3.87–5.11)
RDW: 14.6 % (ref 11.5–15.5)
WBC: 11.7 10*3/uL — ABNORMAL HIGH (ref 4.0–10.5)
nRBC: 0 % (ref 0.0–0.2)

## 2019-04-10 NOTE — Progress Notes (Signed)
PROGRESS NOTE    Joan [name]  FHQ:[id number] DOB: [birth year] DOA: 03/30/2019 PCP: [name], MD   Brief Narrative: 21 YOF with COPD chronic hypoxic respiratory failure on 2 L [place] at night and 2 to 3 L during exertion in day, hypothyroidism, anxiety/bipolar disorder, diet-controlled diabetes, HTN, HLD admitted with right lower quadrant abdominal pain CT showed acute nonruptured appendicitis status post lap appendectomy 2/3. Hospitalist team following for medical management.  Subjective:  Afebrile overnight T-max 98, saturating well on room air, hemodynamically stable. Eating 50% of diet yesterday- On full liquid diet. Feels much better this morning less abdominal bloating and less pain.  Stool is loose. Drain output Decreasing at 160 mL Blood work shows WBC count decreasing, potassium improved.  Assessment & Plan:   Acute gangrenous appendicitis with perforation: s/p lap appendectomy and Jp drain 03/31/19-pod #10.Mild leukocytosis 12.2k-->11.7k, repeat CT abdomen due to ongoing abdominal pain 2/12 -showed phlegmon-antibiotics changed to meropenem 2/11.  Appears improving.  Continue current antibiotics, monitor WBC count temperature curve.she is on scheduled Tylenol.  Continue pain control.  If has ongoing pain may need CT abdomen repeated to make sure phlegmon does not develop into abscess.  Discussed with surgery team today.  Diet advanced to  FLD.Defer ongoing postop plan to surgery. Encourage mobilization, pt/ot.  Hypokalemia: improved. Cont ivf.   Ileus: Patient pulled NG tube.  Tolerating diet ileus improving monitor and replace electrolytes.   COPD with chronic hypoxic respiratory failure 2 L [place] at night and 2 to 3 L during exertion in day : On Dulera, DuoNeb  as needed bronchodilators.  Respiratory status is stable.  Anxiety/BPD: Continue home Effexor, trazodone, Seroquel  Essential hypertension:controlled.  On metoprolol.  Dyslipidemia-resume statins once tolerating  po  Diet-controlled diabetes mellitus: Continue diet control.  Hypothyroidism: on Synthroid.  GERD without esophagitis- cont ppi  Nutrition: CLD-advance to FLD 2/12-> soft diet 2/13  Body mass index is 32.27 kg/m.   DVT prophylaxis: lovenox Code Status:full Family Communication: plan of care discussed with patient at bedside.  Sister updated by primary team. Disposition Plan: Patient is from:home Anticipated d/c to: Home health versus SNF next 2 to 4 days.   Barriers to d/c or conditions that needs to be met prior to d/c: Awaiting diet tolerance return of bowel function and stable postop course.Hospitalist team will continue to follow.  Discharge disposition per surgery  Consultants:TRH. Procedures:S/P LAP APPENDECTOMY. Microbiology:see note. Antimicrobials: Anti-infectives (From admission, onward)   Start     Dose/Rate Route Frequency Ordered Stop   04/09/19 2200  piperacillin-tazobactam (ZOSYN) IVPB 3.375 g     3.375 g 12.5 mL/hr over 240 Minutes Intravenous Every 8 hours 04/09/19 1542     04/09/19 1600  piperacillin-tazobactam (ZOSYN) IVPB 3.375 g     3.375 g 100 mL/hr over 30 Minutes Intravenous  Once 04/09/19 1533 04/09/19 1651   04/08/19 1400  meropenem (MERREM) 1 g in sodium chloride 0.9 % 100 mL IVPB  Status:  Discontinued     1 g 200 mL/hr over 30 Minutes Intravenous Every 8 hours 04/08/19 1335 04/09/19 1533   03/31/19 0500  ciprofloxacin (CIPRO) IVPB 400 mg  Status:  Discontinued     400 mg 200 mL/hr over 60 Minutes Intravenous Every 12 hours 03/30/19 2242 04/08/19 1320   03/31/19 0200  metroNIDAZOLE (FLAGYL) IVPB 500 mg  Status:  Discontinued     500 mg 100 mL/hr over 60 Minutes Intravenous Every 8 hours 03/30/19 2242 04/08/19 1320   03/30/19 1630  ciprofloxacin (CIPRO) IVPB 400 mg     400 mg 200 mL/hr over 60 Minutes Intravenous  Once 03/30/19 1620 03/30/19 1914   03/30/19 1630  metroNIDAZOLE (FLAGYL) IVPB 500 mg     500 mg 100 mL/hr over 60 Minutes  Intravenous  Once 03/30/19 1620 03/30/19 1914      Medications: Scheduled Meds: . acetaminophen  1,000 mg Oral Q8H  . enoxaparin (LOVENOX) injection  40 mg Subcutaneous Daily  . gabapentin  300 mg Oral BID  . insulin aspart  0-9 Units Subcutaneous Q4H  . ipratropium-albuterol  3 mL Nebulization BID  . levothyroxine  25 mcg Oral Q0600  . metoprolol tartrate  2.5 mg Intravenous Q6H  . mometasone-formoterol  2 puff Inhalation BID  . nicotine  14 mg Transdermal Daily  . pantoprazole  40 mg Oral BID  . QUEtiapine  50 mg Oral BID  . saccharomyces boulardii  250 mg Oral BID  . sodium chloride flush  3 mL Intravenous Once  . traZODone  100 mg Oral QHS  . venlafaxine XR  150 mg Oral Q breakfast   Continuous Infusions: . 0.9 % NaCl with KCl 20 mEq / L 75 mL/hr at 04/10/19 0200  . piperacillin-tazobactam (ZOSYN)  IV 3.375 g (04/10/19 0640)    Objective: Vitals:   04/09/19 1750 04/09/19 2000 04/09/19 2032 04/10/19 0528  BP: 130/70  (!) 157/89 (!) 151/84  Pulse:   84 70  Resp:   16 18  Temp:   97.7 F (36.5 C) 97.7 F (36.5 C)  TempSrc:   Oral Oral  SpO2:  96% 94% 95%  Weight:      Height:        Intake/Output Summary (Last 24 hours) at 04/10/2019 0808 Last data filed at 04/10/2019 0640 Gross per 24 hour  Intake 2599.31 ml  Output 960 ml  Net 1639.31 ml   Filed Weights   03/30/19 1259 03/31/19 1135  Weight: 90.7 kg 90.7 kg   Weight change:   Body mass index is 32.27 kg/m.  Intake/Output from previous day: 02/12 0701 - 02/13 0700 In: 2599.3 [P.O.:510; I.V.:1666.9; IV Piggyback:342.4] Out: 960 [Urine:800; Drains:160] Intake/Output this shift: No intake/output data recorded.  Examination:  General exam: AAO X3, not in acute distress.  On room air.Marland Kitchen HEENT:Oral mucosa moist, Ear/Nose WNL grossly, dentition normal. Respiratory system: bilaterally clear,no wheezing or crackles,no use of accessory muscle Cardiovascular system: S1 & S2 +, No JVD,. Gastrointestinal  system: Abdomen soft, mildly distended and mildly tender, bowel sounds present.   Nervous System:Alert, awake, moving extremities and grossly nonfocal Extremities: No edema, distal peripheral pulses palpable.  Skin: No rashes,no icterus. MSK: Normal muscle bulk,tone, power  Data Reviewed: I have personally reviewed following labs and imaging studies  CBC: Recent Labs  Lab 04/06/19 0817 04/07/19 0358 04/08/19 0434 04/09/19 0350 04/10/19 0340  WBC 8.3 9.5 10.3 12.2* 11.7*  HGB 11.0* 10.3* 10.5* 11.6* 9.9*  HCT 34.7* 33.9* 33.2* 38.0 32.5*  MCV 96.7 97.7 96.8 98.2 99.1  PLT 352 353 369 402* 099   Basic Metabolic Panel: Recent Labs  Lab 04/05/19 0421 04/05/19 0421 04/06/19 0817 04/07/19 0358 04/08/19 0434 04/09/19 0350 04/10/19 0340  NA 142   < > 141 142 145 140 142  K 3.0*   < > 3.3* 3.3* 3.5 3.8 3.8  CL 103   < > 105 109 111 110 110  CO2 30   < > _0 GLUCOSE 128*   < > 124*  114* 117* 136* 111*  BUN 11   < > 7 <5* 5* <5* 6  CREATININE 0.80   < > 0.92 0.81 0.75 0.73 0.81  CALCIUM 8.2*   < > 8.1* 8.1* 8.4* 8.3* 7.9*  MG 2.1  --   --   --   --   --   --    < > = values in this interval not displayed.   GFR: Estimated Creatinine Clearance: 87 mL/min (by C-G formula based on SCr of 0.81 mg/dL). Liver Function Tests: No results for input(s): AST, ALT, ALKPHOS, BILITOT, PROT, ALBUMIN in the last 168 hours. No results for input(s): LIPASE, AMYLASE in the last 168 hours. No results for input(s): AMMONIA in the last 168 hours. Coagulation Profile: No results for input(s): INR, PROTIME in the last 168 hours. Cardiac Enzymes: No results for input(s): CKTOTAL, CKMB, CKMBINDEX, TROPONINI in the last 168 hours. BNP (last 3 results) No results for input(s): PROBNP in the last 8760 hours. HbA1C: No results for input(s): HGBA1C in the last 72 hours. CBG: Recent Labs  Lab 04/09/19 1622 04/09/19 2033 04/09/19 2342 04/10/19 0349 04/10/19 0758  GLUCAP 85 106* 99  112* 109*   Lipid Profile: No results for input(s): CHOL, HDL, LDLCALC, TRIG, CHOLHDL, LDLDIRECT in the last 72 hours. Thyroid Function Tests: No results for input(s): TSH, T4TOTAL, FREET4, T3FREE, THYROIDAB in the last 72 hours. Anemia Panel: No results for input(s): VITAMINB12, FOLATE, FERRITIN, TIBC, IRON, RETICCTPCT in the last 72 hours. Sepsis Labs: No results for input(s): PROCALCITON, LATICACIDVEN in the last 168 hours.  No results found for this or any previous visit (from the past 240 hour(s)).    Radiology Studies: CT ABDOMEN PELVIS W CONTRAST  Result Date: 04/08/2019 CLINICAL DATA:  RIGHT lower quadrant pain, appendicitis by prior CT post laparoscopic appendectomy on 03/31/2019, has a drain, question abscess/infection EXAM: CT ABDOMEN AND PELVIS WITH CONTRAST TECHNIQUE: Multidetector CT imaging of the abdomen and pelvis was performed using the standard protocol following bolus administration of intravenous contrast. Sagittal and coronal MPR images reconstructed from axial data set. CONTRAST:  124m OMNIPAQUE IOHEXOL 300 MG/ML SOLN IV. Dilute oral contrast. COMPARISON:  03/30/2019 FINDINGS: Lower chest: Lung bases clear Hepatobiliary: Gallbladder liver normal appearance Pancreas: Normal appearance Spleen: Calcified granulomata within spleen. No focal splenic lesions. Adrenals/Urinary Tract: Adrenal glands, kidneys, visualized ureters, and bladder normal appearance. Beam hardening artifacts in pelvis obscure portions of the distal ureters. Stomach/Bowel: Inflammatory process at the RIGHT mid abdomen at site of prior appendicitis. Long retrocecal appendiceal stump is identified which demonstrates mild wall thickening. Infiltrative changes are seen adjacent to the appendiceal stump compatible with phlegmon/inflammation; this does not to be appear to be a well-defined drainable abscess collection at this point. Phlegmon measures approximately 3.8 x 3.2 cm. Mild surrounding infiltrative changes  with fluid extending to lateral conal fascia. No extraluminal gas. Diffuse dilatation of small bowel loops suggesting postoperative ileus. Colon decompressed. Stomach unremarkable. Vascular/Lymphatic: Atherosclerotic calcifications aorta and iliac arteries without aneurysm. No adenopathy. Reproductive: Unremarkable uterus and adnexa Other: Surgical drain in mid abdomen/pelvis. No free air. No hernia. Musculoskeletal: Prior lumbar fusion.  BILATERAL hip prostheses. IMPRESSION: Long retrocecal appendiceal stump with inflammatory changes and phlegmon adjacent to the distal stump representing sequela of prior appendicitis, stump appendicitis, or stump breakdown. Phlegmon measures approximately 3.8 x 3.2 cm without definite well-defined borders to suggest abscess at this time. No extraluminal gas. Postoperative ileus. Findings called to [facility] [employer] on 04/08/2019 at 1235 hours. Electronically Signed  By: Lavonia Dana M.D.   On: 04/08/2019 12:36     LOS: 11 days   Time spent: More than 50% of that time was spent in counseling and/or coordination of care.  Antonieta Pert, MD [facility]  04/10/2019, 8:08 AM

## 2019-04-10 NOTE — Progress Notes (Signed)
Physical Therapy Treatment Patient Details Name: Joan [name] MRN: [id number] DOB: 12/20/1960 Today's Date: 04/10/2019    History of Present Illness Patient is 58 y.o. female s/p laproscopic appendectomy on 03/31/19 with PMH significant for HTN, hypothyroidism, GERD, COPD, DM, chronic pain, anxiety, depression, and OA. She also had a recent Lt THR on 01/26/19 and continues to be on posterior hip precautions.    PT Comments    Pt continues to participate well.    Follow Up Recommendations  Home health PT;Supervision for mobility/OOB     Equipment Recommendations  None recommended by PT    Recommendations for Other Services       Precautions / Restrictions Precautions Precautions: Posterior Hip;Fall Precaution Comments: THRev post 12/20, Home Oxygen 2 lts at night; JP drain Restrictions Weight Bearing Restrictions: No    Mobility  Bed Mobility               General bed mobility comments: pt sitting EOB  Transfers Overall transfer level: Needs assistance Equipment used: None Transfers: Sit to/from Stand Sit to Stand: Supervision            Ambulation/Gait Ambulation/Gait assistance: Min guard Gait Distance (Feet): 200 Feet Assistive device: IV Pole Gait Pattern/deviations: Decreased stride length     General Gait Details: good posture today. slow gait speed. IV used for support   Marine scientist Rankin (Stroke Patients Only)       Balance Overall balance assessment: Needs assistance         Standing balance support: During functional activity Standing balance-Leahy Scale: Fair                              Cognition Arousal/Alertness: Awake/alert Behavior During Therapy: WFL for tasks assessed/performed Overall Cognitive Status: Within Functional Limits for tasks assessed                                        Exercises      General Comments        Pertinent  Vitals/Pain Pain Assessment: Faces Faces Pain Scale: Hurts little more Pain Location: abdomen Pain Descriptors / Indicators: Discomfort;Sore Pain Intervention(s): Monitored during session    Home Living                      Prior Function            PT Goals (current goals can now be found in the care plan section) Progress towards PT goals: Progressing toward goals    Frequency    Min 3X/week      PT Plan Current plan remains appropriate    Co-evaluation              AM-PAC PT "6 Clicks" Mobility   Outcome Measure  Help needed turning from your back to your side while in a flat bed without using bedrails?: A Little Help needed moving from lying on your back to sitting on the side of a flat bed without using bedrails?: A Little Help needed moving to and from a bed to a chair (including a wheelchair)?: A Little Help needed standing up from a chair using your arms (e.g., wheelchair or bedside chair)?: A Little Help needed to walk in hospital  room?: A Little Help needed climbing 3-5 steps with a railing? : A Little 6 Click Score: 18    End of Session   Activity Tolerance: Patient tolerated treatment well Patient left: in bed;with call bell/phone within reach(sitting EOB)   PT Visit Diagnosis: Difficulty in walking, not elsewhere classified (R26.2)     Time: [id number] PT Time Calculation (min) (ACUTE ONLY): 18 min  Charges:  $Gait Training: 8-22 mins                         Doreatha Massed, PT Acute Rehabilitation

## 2019-04-10 NOTE — Progress Notes (Signed)
10 Days Post-Op  Subjective: CC: Pain persistent, tolerating PO   Objective: Vital signs in last 24 hours: Temp:  [97.7 F (36.5 C)-98 F (36.7 C)] 97.7 F (36.5 C) (02/13 0528) Pulse Rate:  [70-84] 70 (02/13 0528) Resp:  [16-18] 18 (02/13 0528) BP: (125-157)/(70-89) 151/84 (02/13 0528) SpO2:  [92 %-97 %] 94 % (02/13 0837) Last BM Date: 04/10/19  Intake/Output from previous day: 02/12 0701 - 02/13 0700 In: 2599.3 [P.O.:510; I.V.:1666.9; IV Piggyback:342.4] Out: 960 [Urine:800; Drains:160] Intake/Output this shift: Total I/O In: 120 [P.O.:120] Out: -   PE: Gen: Alert, NAD   Lab Results:  Recent Labs    04/09/19 0350 04/10/19 0340  WBC 12.2* 11.7*  HGB 11.6* 9.9*  HCT 38.0 32.5*  PLT 402* 362   BMET Recent Labs    04/09/19 0350 04/10/19 0340  NA 140 142  K 3.8 3.8  CL 110 110  CO2 23 24  GLUCOSE 136* 111*  BUN <5* 6  CREATININE 0.73 0.81  CALCIUM 8.3* 7.9*   PT/INR No results for input(s): LABPROT, INR in the last 72 hours. CMP     Component Value Date/Time   NA 142 04/10/2019 0340   NA 140 12/09/2018 1133   K 3.8 04/10/2019 0340   CL 110 04/10/2019 0340   CO2 24 04/10/2019 0340   GLUCOSE 111 (H) 04/10/2019 0340   BUN 6 04/10/2019 0340   BUN 18 12/09/2018 1133   CREATININE 0.81 04/10/2019 0340   CALCIUM 7.9 (L) 04/10/2019 0340   PROT 7.6 03/31/2019 0417   PROT 6.5 08/19/2017 0822   ALBUMIN 4.3 03/31/2019 0417   ALBUMIN 4.5 08/19/2017 0822   AST 20 03/31/2019 0417   ALT 28 03/31/2019 0417   ALKPHOS 125 03/31/2019 0417   BILITOT 1.2 03/31/2019 0417   BILITOT 0.3 08/19/2017 0822   GFRNONAA >60 04/10/2019 0340   GFRAA >60 04/10/2019 0340   Lipase     Component Value Date/Time   LIPASE 21 03/30/2019 1336       Studies/Results: CT ABDOMEN PELVIS W CONTRAST  Result Date: 04/08/2019 CLINICAL DATA:  RIGHT lower quadrant pain, appendicitis by prior CT post laparoscopic appendectomy on 03/31/2019, has a drain, question  abscess/infection EXAM: CT ABDOMEN AND PELVIS WITH CONTRAST TECHNIQUE: Multidetector CT imaging of the abdomen and pelvis was performed using the standard protocol following bolus administration of intravenous contrast. Sagittal and coronal MPR images reconstructed from axial data set. CONTRAST:  141mL OMNIPAQUE IOHEXOL 300 MG/ML SOLN IV. Dilute oral contrast. COMPARISON:  03/30/2019 FINDINGS: Lower chest: Lung bases clear Hepatobiliary: Gallbladder liver normal appearance Pancreas: Normal appearance Spleen: Calcified granulomata within spleen. No focal splenic lesions. Adrenals/Urinary Tract: Adrenal glands, kidneys, visualized ureters, and bladder normal appearance. Beam hardening artifacts in pelvis obscure portions of the distal ureters. Stomach/Bowel: Inflammatory process at the RIGHT mid abdomen at site of prior appendicitis. Long retrocecal appendiceal stump is identified which demonstrates mild wall thickening. Infiltrative changes are seen adjacent to the appendiceal stump compatible with phlegmon/inflammation; this does not to be appear to be a well-defined drainable abscess collection at this point. Phlegmon measures approximately 3.8 x 3.2 cm. Mild surrounding infiltrative changes with fluid extending to lateral conal fascia. No extraluminal gas. Diffuse dilatation of small bowel loops suggesting postoperative ileus. Colon decompressed. Stomach unremarkable. Vascular/Lymphatic: Atherosclerotic calcifications aorta and iliac arteries without aneurysm. No adenopathy. Reproductive: Unremarkable uterus and adnexa Other: Surgical drain in mid abdomen/pelvis. No free air. No hernia. Musculoskeletal: Prior lumbar fusion.  BILATERAL hip prostheses.  IMPRESSION: Long retrocecal appendiceal stump with inflammatory changes and phlegmon adjacent to the distal stump representing sequela of prior appendicitis, stump appendicitis, or stump breakdown. Phlegmon measures approximately 3.8 x 3.2 cm without definite  well-defined borders to suggest abscess at this time. No extraluminal gas. Postoperative ileus. Findings called to [employer] [facility] on 04/08/2019 at 1235 hours. Electronically Signed   By: Lavonia Dana M.D.   On: 04/08/2019 12:36    Anti-infectives: Anti-infectives (From admission, onward)   Start     Dose/Rate Route Frequency Ordered Stop   04/09/19 2200  piperacillin-tazobactam (ZOSYN) IVPB 3.375 g     3.375 g 12.5 mL/hr over 240 Minutes Intravenous Every 8 hours 04/09/19 1542     04/09/19 1600  piperacillin-tazobactam (ZOSYN) IVPB 3.375 g     3.375 g 100 mL/hr over 30 Minutes Intravenous  Once 04/09/19 1533 04/09/19 1651   04/08/19 1400  meropenem (MERREM) 1 g in sodium chloride 0.9 % 100 mL IVPB  Status:  Discontinued     1 g 200 mL/hr over 30 Minutes Intravenous Every 8 hours 04/08/19 1335 04/09/19 1533   03/31/19 0500  ciprofloxacin (CIPRO) IVPB 400 mg  Status:  Discontinued     400 mg 200 mL/hr over 60 Minutes Intravenous Every 12 hours 03/30/19 2242 04/08/19 1320   03/31/19 0200  metroNIDAZOLE (FLAGYL) IVPB 500 mg  Status:  Discontinued     500 mg 100 mL/hr over 60 Minutes Intravenous Every 8 hours 03/30/19 2242 04/08/19 1320   03/30/19 1630  ciprofloxacin (CIPRO) IVPB 400 mg     400 mg 200 mL/hr over 60 Minutes Intravenous  Once 03/30/19 1620 03/30/19 1914   03/30/19 1630  metroNIDAZOLE (FLAGYL) IVPB 500 mg     500 mg 100 mL/hr over 60 Minutes Intravenous  Once 03/30/19 1620 03/30/19 1914       Assessment/Plan Severe COPDon home O2 Severe medical comorbidities. Appreciate medical assistance in managing these.  Acute gangrenous appendicitis, perforation - S/P lap appy, JP drain, Dr. Rosendo Gros, 02/03 - CT 2/11 w/ long retrocecal appendiceal stump with 3.8x 3.2cm phlegmon - Cont IV abx. Changed to [facility] 2/11 - Monitor WBC. May need repeat CT scan early next week to see if phlegmon developing into abscess - Ileus resolving. Advance to soft diet. Allergic to Glucerna  shakes  - Monitor diarrhea. Start on probiotics  - Mobilize and IS   FEN:FLD  VTE: SCD's, lovenox [id number] &Flagyl 02/02 - 2/11. Merrem 2/11 >>  Afebrile on scheduled tylenol  Foley:None Follow up:Dr. Rosendo Gros  I updated her sister [name] [phone]) per patient request   LOS: 107 days    Clovis Riley , [facility] 04/10/2019, 8:57 AM Please see Amion for pager number during day hours 7:00am-4:30pm

## 2019-04-11 DIAGNOSIS — E876 Hypokalemia: Secondary | ICD-10-CM | POA: Diagnosis not present

## 2019-04-11 LAB — CBC
HCT: 33.8 % — ABNORMAL LOW (ref 36.0–46.0)
Hemoglobin: 10.5 g/dL — ABNORMAL LOW (ref 12.0–15.0)
MCH: 30.8 pg (ref 26.0–34.0)
MCHC: 31.1 g/dL (ref 30.0–36.0)
MCV: 99.1 fL (ref 80.0–100.0)
Platelets: 431 10*3/uL — ABNORMAL HIGH (ref 150–400)
RBC: 3.41 MIL/uL — ABNORMAL LOW (ref 3.87–5.11)
RDW: 14.6 % (ref 11.5–15.5)
WBC: 12.9 10*3/uL — ABNORMAL HIGH (ref 4.0–10.5)
nRBC: 0 % (ref 0.0–0.2)

## 2019-04-11 LAB — MAGNESIUM: Magnesium: 1.4 mg/dL — ABNORMAL LOW (ref 1.7–2.4)

## 2019-04-11 LAB — BASIC METABOLIC PANEL
Anion gap: 9 (ref 5–15)
BUN: 10 mg/dL (ref 6–20)
CO2: 29 mmol/L (ref 22–32)
Calcium: 8.5 mg/dL — ABNORMAL LOW (ref 8.9–10.3)
Chloride: 107 mmol/L (ref 98–111)
Creatinine, Ser: 0.87 mg/dL (ref 0.44–1.00)
GFR calc Af Amer: >60 mL/min (ref >60)
GFR calc non Af Amer: >60 mL/min (ref >60)
Glucose, Bld: 103 mg/dL — ABNORMAL HIGH (ref 70–99)
Potassium: 3.6 mmol/L (ref 3.5–5.1)
Sodium: 145 mmol/L (ref 135–145)

## 2019-04-11 LAB — GLUCOSE, CAPILLARY
Glucose-Capillary: 129 mg/dL — ABNORMAL HIGH (ref 70–99)
Glucose-Capillary: 93 mg/dL (ref 70–99)
Glucose-Capillary: 123 mg/dL — ABNORMAL HIGH (ref 70–99)
Glucose-Capillary: 110 mg/dL — ABNORMAL HIGH (ref 70–99)
Glucose-Capillary: 113 mg/dL — ABNORMAL HIGH (ref 70–99)

## 2019-04-11 MED ORDER — MAGNESIUM SULFATE 4 GM/100ML IV SOLN
4.0000 g | Freq: Once | INTRAVENOUS | Status: AC
  Administered 2019-04-11: 4 g via INTRAVENOUS
  Filled 2019-04-11: qty 100

## 2019-04-11 MED ORDER — SIMETHICONE 40 MG/0.6ML PO SUSP
40.0000 mg | Freq: Three times a day (TID) | ORAL | Status: DC | PRN
  Administered 2019-04-12: 40 mg via ORAL
  Filled 2019-04-11 (×3): qty 0.6

## 2019-04-11 MED ORDER — POTASSIUM CHLORIDE 20 MEQ PO PACK
40.0000 meq | PACK | Freq: Once | ORAL | Status: AC
  Administered 2019-04-11: 40 meq via ORAL
  Filled 2019-04-11: qty 2

## 2019-04-11 NOTE — Progress Notes (Signed)
PROGRESS NOTE    Joan [name]  ZOX:[id number] DOB: November 03, 1960 DOA: 03/30/2019 PCP: [name], MD   Brief Narrative: 58 YOF with COPD chronic hypoxic respiratory failure on 2 L [place] at night and 2 to 3 L during exertion in day, hypothyroidism, anxiety/bipolar disorder, diet-controlled diabetes, HTN, HLD admitted with right lower quadrant abdominal pain CT showed acute nonruptured appendicitis status post lap appendectomy 2/3. Hospitalist team following for medical management.  Subjective:  Complaining of IV hurting on the right arm.  Was removed. Complains of flatulence not feeling good today. Has abdominal pain and distention.  Passing gas in BM Afebrile with T-max 98.3.  WBC slightly bumped up 12.9K. Hypomagnesemia 1.4.  Assessment & Plan:   Acute gangrenous appendicitis with perforation: s/p lap appendectomy and Jp drain 03/31/19-pod #11.Mild leukocytosis 12.2k-->11.7k->12.9k, but afebrile. Repeat CT abdomen due to ongoing abdominal pain 2/12 -showed phlegmon-antibiotics changed to meropenem 2/11->zosyn 2/12. She is on scheduled Tylenol.  Continue pain control.  If has ongoing pain or worsening leucytosis may need CT abdomen repeated to make sure phlegmon does not develop into abscess.  Plan as per surgery team.  On soft diet today.  Complaining of flatulence and added simethicone as needed.   Hypokalemia: Improved.  Hypomagnesemia:being repleted.   Ileus: Patient pulled NG tube.  Tolerating diet ileus improving monitor and replace electrolytes.   COPD with chronic hypoxic respiratory failure 2 L [place] at night and 2 to 3 L during exertion in day : On Dulera, DuoNeb  as needed bronchodilators.  Respiratory status is stable.  Anxiety/BPD: Continue home Effexor, trazodone, Seroquel  Essential hypertension:controlled.  On metoprolol iv.  Dyslipidemia-resume statins once tolerating po  Diet-controlled diabetes mellitus: Continue diet control.  Hypothyroidism: on Synthroid.   GERD without esophagitis- cont ppi  Nutrition: CLD-advance to FLD 2/12-> soft diet 2/13  Body mass index is 32.27 kg/m.   DVT prophylaxis: lovenox Code Status:full Family Communication: plan of care discussed with patient at bedside.  Sister updated by primary team. Disposition Plan: Patient is from:home Anticipated d/c to: Home health versus SNF next 2 to 4 days.   Barriers to d/c or conditions that needs to be met prior to d/c: Awaiting diet tolerance return of bowel function and stable postop course.Hospitalist team will continue to follow.  Discharge disposition per surgery  Consultants:TRH. Procedures:S/P LAP APPENDECTOMY. Microbiology:see note. Antimicrobials: Anti-infectives (From admission, onward)   Start     Dose/Rate Route Frequency Ordered Stop   04/09/19 2200  piperacillin-tazobactam (ZOSYN) IVPB 3.375 g     3.375 g 12.5 mL/hr over 240 Minutes Intravenous Every 8 hours 04/09/19 1542     04/09/19 1600  piperacillin-tazobactam (ZOSYN) IVPB 3.375 g     3.375 g 100 mL/hr over 30 Minutes Intravenous  Once 04/09/19 1533 04/09/19 1651   04/08/19 1400  meropenem (MERREM) 1 g in sodium chloride 0.9 % 100 mL IVPB  Status:  Discontinued     1 g 200 mL/hr over 30 Minutes Intravenous Every 8 hours 04/08/19 1335 04/09/19 1533   03/31/19 0500  ciprofloxacin (CIPRO) IVPB 400 mg  Status:  Discontinued     400 mg 200 mL/hr over 60 Minutes Intravenous Every 12 hours 03/30/19 2242 04/08/19 1320   03/31/19 0200  metroNIDAZOLE (FLAGYL) IVPB 500 mg  Status:  Discontinued     500 mg 100 mL/hr over 60 Minutes Intravenous Every 8 hours 03/30/19 2242 04/08/19 1320   03/30/19 1630  ciprofloxacin (CIPRO) IVPB 400 mg     400 mg  200 mL/hr over 60 Minutes Intravenous  Once 03/30/19 1620 03/30/19 1914   03/30/19 1630  metroNIDAZOLE (FLAGYL) IVPB 500 mg     500 mg 100 mL/hr over 60 Minutes Intravenous  Once 03/30/19 1620 03/30/19 1914      Medications: Scheduled Meds: . acetaminophen   1,000 mg Oral Q8H  . enoxaparin (LOVENOX) injection  40 mg Subcutaneous Daily  . gabapentin  300 mg Oral BID  . insulin aspart  0-9 Units Subcutaneous Q4H  . ipratropium-albuterol  3 mL Nebulization BID  . levothyroxine  25 mcg Oral Q0600  . metoprolol tartrate  2.5 mg Intravenous Q6H  . mometasone-formoterol  2 puff Inhalation BID  . nicotine  14 mg Transdermal Daily  . pantoprazole  40 mg Oral BID  . QUEtiapine  50 mg Oral BID  . saccharomyces boulardii  250 mg Oral BID  . sodium chloride flush  3 mL Intravenous Once  . traZODone  100 mg Oral QHS  . venlafaxine XR  150 mg Oral Q breakfast   Continuous Infusions: . magnesium sulfate bolus IVPB    . piperacillin-tazobactam (ZOSYN)  IV 3.375 g (04/11/19 0601)    Objective: Vitals:   04/10/19 1745 04/10/19 1954 04/10/19 2025 04/11/19 0401  BP: (!) 167/88 (!) 154/88  (!) 151/82  Pulse: 78 79  76  Resp:  16  16  Temp:  98 F (36.7 C)  98.1 F (36.7 C)  TempSrc:  Oral  Oral  SpO2:  93% 95% 94%  Weight:      Height:        Intake/Output Summary (Last 24 hours) at 04/11/2019 1037 Last data filed at 04/11/2019 1026 Gross per 24 hour  Intake 1305.76 ml  Output 65 ml  Net 1240.76 ml   Filed Weights   03/30/19 1259 03/31/19 1135  Weight: 90.7 kg 90.7 kg   Weight change:   Body mass index is 32.27 kg/m.  Intake/Output from previous day: 02/13 0701 - 02/14 0700 In: 950 [P.O.:900; IV Piggyback:50] Out: 85 [Drains:85] Intake/Output this shift: Total I/O In: 595.8 [P.O.:480; IV Piggyback:115.8] Out: -   Examination:  General exam: AAO X3, NAD, on room air.  HEENT:Oral mucosa moist, Ear/Nose WNL grossly, dentition normal. Respiratory system: bilaterally clear,no wheezing or crackles,no use of accessory muscle Cardiovascular system: S1 & S2 +, No JVD,. Gastrointestinal system: Abdomen soft/obese also distended, bowel sounds present. Nervous System:Alert, awake, moving extremities and grossly nonfocal Extremities: No  edema, distal peripheral pulses palpable.  Skin: No rashes,no icterus. MSK: Normal muscle bulk,tone, power  Data Reviewed: I have personally reviewed following labs and imaging studies  CBC: Recent Labs  Lab 04/07/19 0358 04/08/19 0434 04/09/19 0350 04/10/19 0340 04/11/19 0449  WBC 9.5 10.3 12.2* 11.7* 12.9*  HGB 10.3* 10.5* 11.6* 9.9* 10.5*  HCT 33.9* 33.2* 38.0 32.5* 33.8*  MCV 97.7 96.8 98.2 99.1 99.1  PLT 353 369 402* 362 539*   Basic Metabolic Panel: Recent Labs  Lab 04/05/19 0421 04/06/19 0817 04/07/19 0358 04/08/19 0434 04/09/19 0350 04/10/19 0340 04/11/19 0449  NA 142   < > 142 145 140 142 145  K 3.0*   < > 3.3* 3.5 3.8 3.8 3.6  CL 103   < > 109 111 110 110 107  CO2 30   < > _0 GLUCOSE 128*   < > 114* 117* 136* 111* 103*  BUN 11   < > <5* 5* <5* 6 10  CREATININE 0.80   < >  0.81 0.75 0.73 0.81 0.87  CALCIUM 8.2*   < > 8.1* 8.4* 8.3* 7.9* 8.5*  MG 2.1  --   --   --   --   --  1.4*   < > = values in this interval not displayed.   GFR: Estimated Creatinine Clearance: 81 mL/min (by C-G formula based on SCr of 0.87 mg/dL). Liver Function Tests: No results for input(s): AST, ALT, ALKPHOS, BILITOT, PROT, ALBUMIN in the last 168 hours. No results for input(s): LIPASE, AMYLASE in the last 168 hours. No results for input(s): AMMONIA in the last 168 hours. Coagulation Profile: No results for input(s): INR, PROTIME in the last 168 hours. Cardiac Enzymes: No results for input(s): CKTOTAL, CKMB, CKMBINDEX, TROPONINI in the last 168 hours. BNP (last 3 results) No results for input(s): PROBNP in the last 8760 hours. HbA1C: No results for input(s): HGBA1C in the last 72 hours. CBG: Recent Labs  Lab 04/10/19 1612 04/10/19 1950 04/10/19 2340 04/11/19 0357 04/11/19 0726  GLUCAP 116* 112* 113* 129* 93   Lipid Profile: No results for input(s): CHOL, HDL, LDLCALC, TRIG, CHOLHDL, LDLDIRECT in the last 72 hours. Thyroid Function Tests: No results for  input(s): TSH, T4TOTAL, FREET4, T3FREE, THYROIDAB in the last 72 hours. Anemia Panel: No results for input(s): VITAMINB12, FOLATE, FERRITIN, TIBC, IRON, RETICCTPCT in the last 72 hours. Sepsis Labs: No results for input(s): PROCALCITON, LATICACIDVEN in the last 168 hours.  No results found for this or any previous visit (from the past 240 hour(s)).    Radiology Studies: No results found.   LOS: 12 days   Time spent: More than 50% of that time was spent in counseling and/or coordination of care.  Antonieta Pert, MD [facility]  04/11/2019, 10:37 AM

## 2019-04-11 NOTE — Plan of Care (Signed)
  Problem: Clinical Measurements: Goal: Respiratory complications will improve Outcome: Progressing   Problem: Clinical Measurements: Goal: Cardiovascular complication will be avoided Outcome: Progressing   Problem: Health Behavior/Discharge Planning: Goal: Ability to manage health-related needs will improve Outcome: Progressing   Problem: Coping: Goal: Level of anxiety will decrease Outcome: Progressing

## 2019-04-11 NOTE — Progress Notes (Addendum)
11 Days Post-Op  Subjective: CC: No change- pain about the same. No issues with PO yesterday.   Objective: Vital signs in last 24 hours: Temp:  [98 F (36.7 C)-98.3 F (36.8 C)] 98.1 F (36.7 C) (02/14 0401) Pulse Rate:  [72-79] 76 (02/14 0401) Resp:  [16] 16 (02/14 0401) BP: (151-187)/(82-101) 151/82 (02/14 0401) SpO2:  [93 %-95 %] 94 % (02/14 0401) Last BM Date: 04/11/19  Intake/Output from previous day: 02/13 0701 - 02/14 0700 In: 950 [P.O.:900; IV Piggyback:50] Out: 85 [Drains:85] Intake/Output this shift: Total I/O In: 355.8 [P.O.:240; IV Piggyback:115.8] Out: -   PE: Gen: Alert, NAD Abdomen soft, tender in right lower and lower midline. JP serous. Incisions c/d/i   Lab Results:  Recent Labs    04/10/19 0340 04/11/19 0449  WBC 11.7* 12.9*  HGB 9.9* 10.5*  HCT 32.5* 33.8*  PLT 362 431*   BMET Recent Labs    04/10/19 0340 04/11/19 0449  NA 142 145  K 3.8 3.6  CL 110 107  CO2 24 29  GLUCOSE 111* 103*  BUN 6 10  CREATININE 0.81 0.87  CALCIUM 7.9* 8.5*   PT/INR No results for input(s): LABPROT, INR in the last 72 hours. CMP     Component Value Date/Time   NA 145 04/11/2019 0449   NA 140 12/09/2018 1133   K 3.6 04/11/2019 0449   CL 107 04/11/2019 0449   CO2 29 04/11/2019 0449   GLUCOSE 103 (H) 04/11/2019 0449   BUN 10 04/11/2019 0449   BUN 18 12/09/2018 1133   CREATININE 0.87 04/11/2019 0449   CALCIUM 8.5 (L) 04/11/2019 0449   PROT 7.6 03/31/2019 0417   PROT 6.5 08/19/2017 0822   ALBUMIN 4.3 03/31/2019 0417   ALBUMIN 4.5 08/19/2017 0822   AST 20 03/31/2019 0417   ALT 28 03/31/2019 0417   ALKPHOS 125 03/31/2019 0417   BILITOT 1.2 03/31/2019 0417   BILITOT 0.3 08/19/2017 0822   GFRNONAA >60 04/11/2019 0449   GFRAA >60 04/11/2019 0449   Lipase     Component Value Date/Time   LIPASE 21 03/30/2019 1336       Studies/Results: No results found.  Anti-infectives: Anti-infectives (From admission, onward)   Start      Dose/Rate Route Frequency Ordered Stop   04/09/19 2200  piperacillin-tazobactam (ZOSYN) IVPB 3.375 g     3.375 g 12.5 mL/hr over 240 Minutes Intravenous Every 8 hours 04/09/19 1542     04/09/19 1600  piperacillin-tazobactam (ZOSYN) IVPB 3.375 g     3.375 g 100 mL/hr over 30 Minutes Intravenous  Once 04/09/19 1533 04/09/19 1651   04/08/19 1400  meropenem (MERREM) 1 g in sodium chloride 0.9 % 100 mL IVPB  Status:  Discontinued     1 g 200 mL/hr over 30 Minutes Intravenous Every 8 hours 04/08/19 1335 04/09/19 1533   03/31/19 0500  ciprofloxacin (CIPRO) IVPB 400 mg  Status:  Discontinued     400 mg 200 mL/hr over 60 Minutes Intravenous Every 12 hours 03/30/19 2242 04/08/19 1320   03/31/19 0200  metroNIDAZOLE (FLAGYL) IVPB 500 mg  Status:  Discontinued     500 mg 100 mL/hr over 60 Minutes Intravenous Every 8 hours 03/30/19 2242 04/08/19 1320   03/30/19 1630  ciprofloxacin (CIPRO) IVPB 400 mg     400 mg 200 mL/hr over 60 Minutes Intravenous  Once 03/30/19 1620 03/30/19 1914   03/30/19 1630  metroNIDAZOLE (FLAGYL) IVPB 500 mg     500 mg 100  mL/hr over 60 Minutes Intravenous  Once 03/30/19 1620 03/30/19 1914       Assessment/Plan Severe COPDon home O2 Severe medical comorbidities. Appreciate medical assistance in managing these.  Acute gangrenous appendicitis, perforation - S/P lap appy, JP drain, Dr. Rosendo Gros, 02/03;  CT 2/11 w/ long retrocecal appendiceal stump with 3.8x 3.2cm phlegmon - Cont IV abx. Changed to [facility] 2/11. WBC up a bit again. Plan CT early this week to eval for drainable abscess.  - Mobilize and IS   [id number]. Hypokalemia- replace. Hypomagnesemia- replace. VTE: SCD's, lovenox [id number] &Flagyl 02/02 - 2/11. Merrem 2/11 >>zosyn 2/12>>>>  Afebrile on scheduled tylenol  Foley:None Follow up:Dr. Pamala Duffel: sister [name] [phone])    LOS: 71 days    Clovis Riley , [facility] 04/11/2019, 8:04 AM Please see Amion for pager  number during day hours 7:00am-4:30pm

## 2019-04-11 NOTE — Progress Notes (Signed)
Pt. able to place CPAP on Independently, oxygen placed in-line after aerosol tx., plan to recheck on rounds, made aware to notify if needed.

## 2019-04-12 LAB — BASIC METABOLIC PANEL
Anion gap: 8 (ref 5–15)
BUN: 12 mg/dL (ref 6–20)
CO2: 26 mmol/L (ref 22–32)
Calcium: 8.1 mg/dL — ABNORMAL LOW (ref 8.9–10.3)
Chloride: 107 mmol/L (ref 98–111)
Creatinine, Ser: 0.81 mg/dL (ref 0.44–1.00)
GFR calc Af Amer: >60 mL/min (ref >60)
GFR calc non Af Amer: >60 mL/min (ref >60)
Glucose, Bld: 125 mg/dL — ABNORMAL HIGH (ref 70–99)
Potassium: 4.1 mmol/L (ref 3.5–5.1)
Sodium: 141 mmol/L (ref 135–145)

## 2019-04-12 LAB — CBC
HCT: 34.1 % — ABNORMAL LOW (ref 36.0–46.0)
Hemoglobin: 10.4 g/dL — ABNORMAL LOW (ref 12.0–15.0)
MCH: 29.9 pg (ref 26.0–34.0)
MCHC: 30.5 g/dL (ref 30.0–36.0)
MCV: 98 fL (ref 80.0–100.0)
Platelets: 429 10*3/uL — ABNORMAL HIGH (ref 150–400)
RBC: 3.48 MIL/uL — ABNORMAL LOW (ref 3.87–5.11)
RDW: 14.5 % (ref 11.5–15.5)
WBC: 12.2 10*3/uL — ABNORMAL HIGH (ref 4.0–10.5)
nRBC: 0 % (ref 0.0–0.2)

## 2019-04-12 LAB — GLUCOSE, CAPILLARY
Glucose-Capillary: 101 mg/dL — ABNORMAL HIGH (ref 70–99)
Glucose-Capillary: 110 mg/dL — ABNORMAL HIGH (ref 70–99)
Glucose-Capillary: 120 mg/dL — ABNORMAL HIGH (ref 70–99)
Glucose-Capillary: 130 mg/dL — ABNORMAL HIGH (ref 70–99)
Glucose-Capillary: 132 mg/dL — ABNORMAL HIGH (ref 70–99)

## 2019-04-12 LAB — MAGNESIUM: Magnesium: 2 mg/dL (ref 1.7–2.4)

## 2019-04-12 MED ORDER — DIPHENHYDRAMINE HCL 25 MG PO CAPS
25.0000 mg | ORAL_CAPSULE | Freq: Four times a day (QID) | ORAL | Status: DC | PRN
  Administered 2019-04-14: 25 mg via ORAL
  Filled 2019-04-12: qty 1

## 2019-04-12 MED ORDER — QUETIAPINE FUMARATE 50 MG PO TABS
50.0000 mg | ORAL_TABLET | Freq: Every day | ORAL | Status: DC
  Administered 2019-04-12 – 2019-04-13 (×2): 50 mg via ORAL
  Filled 2019-04-12 (×2): qty 1

## 2019-04-12 NOTE — Progress Notes (Signed)
Paged MD and PA about ordering a sleeping aid and no call back from either.

## 2019-04-12 NOTE — Progress Notes (Signed)
12 Days Post-Op  Subjective: CC: No change- pain about the same. No issues with PO.  States her bloating is back to her baseline  Objective: Vital signs in last 24 hours: Temp:  [98.1 F (36.7 C)-98.3 F (36.8 C)] 98.1 F (36.7 C) (02/15 0457) Pulse Rate:  [80-84] 84 (02/15 0457) Resp:  [16] 16 (02/15 0457) BP: (111-145)/(68-83) 145/83 (02/15 0457) SpO2:  [93 %-97 %] 97 % (02/15 0713) Last BM Date: 04/11/19  Intake/Output from previous day: 02/14 0701 - 02/15 0700 In: 2050.1 [P.O.:1680; IV Piggyback:370.1] Out: 60 [Drains:60] Intake/Output this shift: No intake/output data recorded.  PE: Gen: Alert, NAD Abdomen soft, tender in right lower and lower midline. JP serous. Incisions c/d/i   Lab Results:  Recent Labs    04/11/19 0449 04/12/19 0357  WBC 12.9* 12.2*  HGB 10.5* 10.4*  HCT 33.8* 34.1*  PLT 431* 429*   BMET Recent Labs    04/11/19 0449 04/12/19 0357  NA 145 141  K 3.6 4.1  CL 107 107  CO2 29 26  GLUCOSE 103* 125*  BUN 10 12  CREATININE 0.87 0.81  CALCIUM 8.5* 8.1*   PT/INR No results for input(s): LABPROT, INR in the last 72 hours. CMP     Component Value Date/Time   NA 141 04/12/2019 0357   NA 140 12/09/2018 1133   K 4.1 04/12/2019 0357   CL 107 04/12/2019 0357   CO2 26 04/12/2019 0357   GLUCOSE 125 (H) 04/12/2019 0357   BUN 12 04/12/2019 0357   BUN 18 12/09/2018 1133   CREATININE 0.81 04/12/2019 0357   CALCIUM 8.1 (L) 04/12/2019 0357   PROT 7.6 03/31/2019 0417   PROT 6.5 08/19/2017 0822   ALBUMIN 4.3 03/31/2019 0417   ALBUMIN 4.5 08/19/2017 0822   AST 20 03/31/2019 0417   ALT 28 03/31/2019 0417   ALKPHOS 125 03/31/2019 0417   BILITOT 1.2 03/31/2019 0417   BILITOT 0.3 08/19/2017 0822   GFRNONAA >60 04/12/2019 0357   GFRAA >60 04/12/2019 0357   Lipase     Component Value Date/Time   LIPASE 21 03/30/2019 1336       Studies/Results: No results found.  Anti-infectives: Anti-infectives (From admission, onward)   Start     Dose/Rate Route Frequency Ordered Stop   04/09/19 2200  piperacillin-tazobactam (ZOSYN) IVPB 3.375 g     3.375 g 12.5 mL/hr over 240 Minutes Intravenous Every 8 hours 04/09/19 1542     04/09/19 1600  piperacillin-tazobactam (ZOSYN) IVPB 3.375 g     3.375 g 100 mL/hr over 30 Minutes Intravenous  Once 04/09/19 1533 04/09/19 1651   04/08/19 1400  meropenem (MERREM) 1 g in sodium chloride 0.9 % 100 mL IVPB  Status:  Discontinued     1 g 200 mL/hr over 30 Minutes Intravenous Every 8 hours 04/08/19 1335 04/09/19 1533   03/31/19 0500  ciprofloxacin (CIPRO) IVPB 400 mg  Status:  Discontinued     400 mg 200 mL/hr over 60 Minutes Intravenous Every 12 hours 03/30/19 2242 04/08/19 1320   03/31/19 0200  metroNIDAZOLE (FLAGYL) IVPB 500 mg  Status:  Discontinued     500 mg 100 mL/hr over 60 Minutes Intravenous Every 8 hours 03/30/19 2242 04/08/19 1320   03/30/19 1630  ciprofloxacin (CIPRO) IVPB 400 mg     400 mg 200 mL/hr over 60 Minutes Intravenous  Once 03/30/19 1620 03/30/19 1914   03/30/19 1630  metroNIDAZOLE (FLAGYL) IVPB 500 mg     500 mg 100  mL/hr over 60 Minutes Intravenous  Once 03/30/19 1620 03/30/19 1914       Assessment/Plan Severe COPDon home O2 Severe medical comorbidities. Appreciate medical assistance in managing these.  Acute gangrenous appendicitis, perforation - S/P lap appy, JP drain, Dr. Rosendo Gros, 02/03;  CT 2/11 w/ long retrocecal appendiceal stump with 3.8x 3.2cm phlegmon - Cont IV abx. Changed to [facility] 2/11. WBC up a bit again. Could repeat CTtomorrow to eval for abscess [id number]. Hypokalemia- replace. Hypomagnesemia- replace. VTE: SCD's, lovenox [id number] &Flagyl 02/02 - 2/11. Merrem 2/11 >>zosyn 2/12>>>>  Afebrile on scheduled tylenol  Foley:None Follow up:Dr. Pamala Duffel: sister [name] [phone])    LOS: 77 days    Joan [name] , [facility] 04/12/2019, 8:47 AM Please see Amion for pager number during day hours  7:00am-4:30pm

## 2019-04-12 NOTE — Plan of Care (Signed)

## 2019-04-12 NOTE — Care Management Important Message (Signed)
Important Message  Patient Details IM Letter given to [employer] Case Manager to present to the Patient Name: Joan [name] MRN: [id number] Date of Birth: 04/13/1960   Medicare Important Message Given:  Yes     Kerin Salen 04/12/2019, 12:02 PM

## 2019-04-12 NOTE — Progress Notes (Signed)
Occupational Therapy Treatment Patient Details Name: Joan [name] MRN: [id number] DOB: 05-17-1960 Today's Date: 04/12/2019    History of present illness Patient is 59 y.o. female s/p laproscopic appendectomy on 03/31/19 with PMH significant for HTN, hypothyroidism, GERD, COPD, DM, chronic pain, anxiety, depression, and OA. She also had a recent Lt THR on 01/26/19 and continues to be on posterior hip precautions.   OT comments  Spoke with patient ~830 to coordinate shower with OT for 11 appointment. Upon arrival ~1110 patient had finished showering with CNA assist for transfer and was seated on toilet drying off. Patient perform toilet transfer x2 this session at supervision level, including peri care. Patient declined use of sock aid "I know how to use it" and had CNA don socks. Patient supervision at sink side to brush her teeth. Patient wanting to participate in functional ambulation. Patient supervision without use of AD requiring multiple standing rest breaks. Will continue to follow.   Follow Up Recommendations  Home health OT;Supervision - Intermittent    Equipment Recommendations  None recommended by OT       Precautions / Restrictions Precautions Precautions: Posterior Hip;Fall Precaution Comments: THRev post 12/20, Home Oxygen 2 lts at night; JP drain Restrictions Weight Bearing Restrictions: No       Mobility Bed Mobility               General bed mobility comments: out of bed upon arrival  Transfers Overall transfer level: Needs assistance Equipment used: None Transfers: Sit to/from Stand Sit to Stand: Supervision         General transfer comment: no physical assist required to power up    Balance Overall balance assessment: Needs assistance Sitting-balance support: Feet supported;No upper extremity supported Sitting balance-Leahy Scale: Good     Standing balance support: No upper extremity supported;During functional activity Standing balance-Leahy  Scale: Fair Standing balance comment: supervision for safety                           ADL either performed or assessed with clinical judgement   ADL Overall ADL's : Needs assistance/impaired     Grooming: Oral care;Supervision/safety;Standing               Lower Body Dressing: Total assistance;Sit to/from stand Lower Body Dressing Details (indicate cue type and reason): to don socks, offered to get sock aid however pt state "I know how to use it" asks CNA to don socks Toilet Transfer: Supervision/safety;Regular Glass blower/designer Details (indicate cue type and reason): supervision for safety Toileting- Clothing Manipulation and Hygiene: Supervision/safety;Sitting/lateral lean Toileting - Clothing Manipulation Details (indicate cue type and reason): peri care after bowel movement     Functional mobility during ADLs: Supervision/safety General ADL Comments: pt supervision level for self care tasks( except donning socks) this session, performed toilet transfer x2, supervision for safety               Cognition Arousal/Alertness: Awake/alert Behavior During Therapy: WFL for tasks assessed/performed Overall Cognitive Status: Within Functional Limits for tasks assessed                                                General Comments patient participate in functional ambulation after toileting and sinkside grooming/hygiene, requires increased time with multiple standing rest breaks at supervision level, no AD.  Pertinent Vitals/ Pain       Pain Assessment: Faces Faces Pain Scale: Hurts little more Pain Location: abdomen Pain Descriptors / Indicators: Discomfort;Cramping Pain Intervention(s): Limited activity within patient's tolerance         Frequency  Min 2X/week        Progress Toward Goals  OT Goals(current goals can now be found in the care plan section)  Progress towards OT goals: Progressing toward goals  Acute Rehab  OT Goals Patient Stated Goal: to get home again OT Goal Formulation: With patient Time For Goal Achievement: 04/15/19 Potential to Achieve Goals: Good ADL Goals Pt Will Perform Grooming: with modified independence;standing Pt Will Perform Lower Body Dressing: with modified independence;sit to/from stand;sitting/lateral leans;with adaptive equipment Pt Will Transfer to Toilet: with modified independence;ambulating Pt Will Perform Toileting - Clothing Manipulation and hygiene: with modified independence;sit to/from stand  Plan Discharge plan remains appropriate       AM-PAC OT "6 Clicks" Daily Activity     Outcome Measure   Help from another person eating meals?: None Help from another person taking care of personal grooming?: A Little Help from another person toileting, which includes using toliet, bedpan, or urinal?: A Little Help from another person bathing (including washing, rinsing, drying)?: A Little Help from another person to put on and taking off regular upper body clothing?: A Little Help from another person to put on and taking off regular lower body clothing?: A Little 6 Click Score: 19    End of Session  OT Visit Diagnosis: Unsteadiness on feet (R26.81);Repeated falls (R29.6);Muscle weakness (generalized) (M62.81);Pain Pain - part of body: (abdomen)   Activity Tolerance Patient tolerated treatment well   Patient Left in bed;with call bell/phone within reach   Nurse Communication Mobility status        Time: [id number] OT Time Calculation (min): 29 min  Charges: OT General Charges $OT Visit: 1 Visit OT Treatments $Self Care/Home Management : 23-37 mins  [place] OT office: [place] 04/12/2019, 1:22 PM

## 2019-04-12 NOTE — Progress Notes (Signed)
PROGRESS NOTE    Joan [name]  JIR:[id number] DOB: [birth year] DOA: 03/30/2019 PCP: [name], MD   Brief Narrative: 46 YOF with COPD chronic hypoxic respiratory failure on 2 L [place] at night and 2 to 3 L during exertion in day, hypothyroidism, anxiety/bipolar disorder, diet-controlled diabetes, HTN, HLD admitted with right lower quadrant abdominal pain CT showed acute nonruptured appendicitis status post lap appendectomy 2/3. Hospitalist team following for medical management.  Subjective: Afebrile overnight.  Patient complains of passing gas some nausea but no vomiting.  Abdominal discomfort overall unchanged.  Assessment & Plan:   Acute gangrenous appendicitis with perforation: s/p lap appendectomy and Jp drain 03/31/19.Mild leukocytosis 12.2k-->11.7k->12.9k, but afebrile. Repeat CT abdomen due to ongoing abdominal pain 2/12 -showed phlegmon-antibiotics On zosyn 2/12. She is on scheduled Tylenol.  Continue pain control.  If has ongoing pain or worsening leucytosis may need CT abdomen repeated to make sure phlegmon does not develop into abscess.  Continue plan as per surgery. On soft diet today.  Complaining of flatulence and added simethicone as needed.   Hypokalemia: Improved.  Hypomagnesemia: Replete and monitor..   Ileus: Patient pulled NG tube.  Tolerating diet ileus improving monitor and replace electrolytes.   COPD with chronic hypoxic respiratory failure 2 L [place] at night and 2 to 3 L during exertion in day : On Dulera, DuoNeb  as needed bronchodilators.  Respiratory status is stable.  Anxiety/BPD: Continue home Effexor, trazodone, Seroquel  Essential hypertension:controlled.  On metoprolol iv.  Dyslipidemia-resume statins once tolerating po  Diet-controlled diabetes mellitus: Continue diet control.  Hypothyroidism: on Synthroid.  GERD without esophagitis- cont ppi  Nutrition: CLD-advance to FLD 2/12-> soft diet 2/13  Body mass index is 32.27 kg/m.   DVT  prophylaxis: lovenox Code Status:full Family Communication: plan of care discussed with patient at bedside.  Sister updated by primary team. Disposition Plan: Patient is from:home Anticipated d/c to: Home health versus SNF next 2-3 days.   Barriers to d/c or conditions that needs to be met prior to d/c: Awaiting diet tolerance return of bowel function and stable postop course.Hospitalist team will continue to follow.  Discharge disposition per surgery  Consultants:TRH. Procedures:S/P LAP APPENDECTOMY. Microbiology:see note. Antimicrobials: Anti-infectives (From admission, onward)   Start     Dose/Rate Route Frequency Ordered Stop   04/09/19 2200  piperacillin-tazobactam (ZOSYN) IVPB 3.375 g     3.375 g 12.5 mL/hr over 240 Minutes Intravenous Every 8 hours 04/09/19 1542     04/09/19 1600  piperacillin-tazobactam (ZOSYN) IVPB 3.375 g     3.375 g 100 mL/hr over 30 Minutes Intravenous  Once 04/09/19 1533 04/09/19 1651   04/08/19 1400  meropenem (MERREM) 1 g in sodium chloride 0.9 % 100 mL IVPB  Status:  Discontinued     1 g 200 mL/hr over 30 Minutes Intravenous Every 8 hours 04/08/19 1335 04/09/19 1533   03/31/19 0500  ciprofloxacin (CIPRO) IVPB 400 mg  Status:  Discontinued     400 mg 200 mL/hr over 60 Minutes Intravenous Every 12 hours 03/30/19 2242 04/08/19 1320   03/31/19 0200  metroNIDAZOLE (FLAGYL) IVPB 500 mg  Status:  Discontinued     500 mg 100 mL/hr over 60 Minutes Intravenous Every 8 hours 03/30/19 2242 04/08/19 1320   03/30/19 1630  ciprofloxacin (CIPRO) IVPB 400 mg     400 mg 200 mL/hr over 60 Minutes Intravenous  Once 03/30/19 1620 03/30/19 1914   03/30/19 1630  metroNIDAZOLE (FLAGYL) IVPB 500 mg     500  mg 100 mL/hr over 60 Minutes Intravenous  Once 03/30/19 1620 03/30/19 1914      Medications: Scheduled Meds: . acetaminophen  1,000 mg Oral Q8H  . enoxaparin (LOVENOX) injection  40 mg Subcutaneous Daily  . gabapentin  300 mg Oral BID  . insulin aspart  0-9 Units  Subcutaneous Q4H  . ipratropium-albuterol  3 mL Nebulization BID  . levothyroxine  25 mcg Oral Q0600  . metoprolol tartrate  2.5 mg Intravenous Q6H  . mometasone-formoterol  2 puff Inhalation BID  . nicotine  14 mg Transdermal Daily  . pantoprazole  40 mg Oral BID  . QUEtiapine  50 mg Oral BID  . saccharomyces boulardii  250 mg Oral BID  . sodium chloride flush  3 mL Intravenous Once  . traZODone  100 mg Oral QHS  . venlafaxine XR  150 mg Oral Q breakfast   Continuous Infusions: . piperacillin-tazobactam (ZOSYN)  IV 3.375 g (04/12/19 1423)    Objective: Vitals:   04/12/19 0457 04/12/19 0713 04/12/19 1026 04/12/19 1301  BP: (!) 145/83  135/79 (!) 137/93  Pulse: 84  80 79  Resp: 16     Temp: 98.1 F (36.7 C)   98.2 F (36.8 C)  TempSrc: Oral   Oral  SpO2: 94% 97% 93% 93%  Weight:      Height:        Intake/Output Summary (Last 24 hours) at 04/12/2019 1423 Last data filed at 04/12/2019 1357 Gross per 24 hour  Intake 994.3 ml  Output 50 ml  Net 944.3 ml   Filed Weights   03/30/19 1259 03/31/19 1135  Weight: 90.7 kg 90.7 kg   Weight change:   Body mass index is 32.27 kg/m.  Intake/Output from previous day: 02/14 0701 - 02/15 0700 In: 2050.1 [P.O.:1680; IV Piggyback:370.1] Out: 60 [Drains:60] Intake/Output this shift: Total I/O In: 360 [P.O.:360] Out: 0   Examination:  General exam: AAO X3, NAD, on room air.   HEENT:Oral mucosa moist, Ear/Nose WNL grossly, dentition normal. Respiratory system: bilaterally clear,no wheezing or crackles,no use of accessory muscle Cardiovascular system: S1 & S2 +, No JVD,. Gastrointestinal system: Abdomen soft obese/mildly distended bowel sounds sluggish.  Nervous System:Alert, awake, moving extremities and grossly nonfocal Extremities: No edema, distal peripheral pulses palpable.  Skin: No rashes,no icterus. MSK: Normal muscle bulk,tone, power  Data Reviewed: I have personally reviewed following labs and imaging studies   CBC: Recent Labs  Lab 04/08/19 0434 04/09/19 0350 04/10/19 0340 04/11/19 0449 04/12/19 0357  WBC 10.3 12.2* 11.7* 12.9* 12.2*  HGB 10.5* 11.6* 9.9* 10.5* 10.4*  HCT 33.2* 38.0 32.5* 33.8* 34.1*  MCV 96.8 98.2 99.1 99.1 98.0  PLT 369 402* 362 431* 102*   Basic Metabolic Panel: Recent Labs  Lab 04/08/19 0434 04/09/19 0350 04/10/19 0340 04/11/19 0449 04/12/19 0357  NA 145 140 142 145 141  K 3.5 3.8 3.8 3.6 4.1  CL 111 110 110 107 107  CO2 27 23 24 29 26   GLUCOSE 117* 136* 111* 103* 125*  BUN 5* <5* 6 10 12   CREATININE 0.75 0.73 0.81 0.87 0.81  CALCIUM 8.4* 8.3* 7.9* 8.5* 8.1*  MG  --   --   --  1.4* 2.0   GFR: Estimated Creatinine Clearance: 87 mL/min (by C-G formula based on SCr of 0.81 mg/dL). Liver Function Tests: No results for input(s): AST, ALT, ALKPHOS, BILITOT, PROT, ALBUMIN in the last 168 hours. No results for input(s): LIPASE, AMYLASE in the last 168 hours. No results for  input(s): AMMONIA in the last 168 hours. Coagulation Profile: No results for input(s): INR, PROTIME in the last 168 hours. Cardiac Enzymes: No results for input(s): CKTOTAL, CKMB, CKMBINDEX, TROPONINI in the last 168 hours. BNP (last 3 results) No results for input(s): PROBNP in the last 8760 hours. HbA1C: No results for input(s): HGBA1C in the last 72 hours. CBG: Recent Labs  Lab 04/11/19 1232 04/11/19 1608 04/11/19 2131 04/12/19 0741 04/12/19 1142  GLUCAP 123* 110* 113* 130* 120*   Lipid Profile: No results for input(s): CHOL, HDL, LDLCALC, TRIG, CHOLHDL, LDLDIRECT in the last 72 hours. Thyroid Function Tests: No results for input(s): TSH, T4TOTAL, FREET4, T3FREE, THYROIDAB in the last 72 hours. Anemia Panel: No results for input(s): VITAMINB12, FOLATE, FERRITIN, TIBC, IRON, RETICCTPCT in the last 72 hours. Sepsis Labs: No results for input(s): PROCALCITON, LATICACIDVEN in the last 168 hours.  No results found for this or any previous visit (from the past 240 hour(s)).     Radiology Studies: No results found.   LOS: 13 days   Time spent: More than 50% of that time was spent in counseling and/or coordination of care.  Antonieta Pert, MD [facility]  04/12/2019, 2:23 PM

## 2019-04-12 NOTE — Progress Notes (Signed)
Physical Therapy Treatment Patient Details Name: Joan [name] MRN: [id number] DOB: Feb 28, 1960 Today's Date: 04/12/2019    History of Present Illness Patient is 59 y.o. female s/p laproscopic appendectomy on 03/31/19 with PMH significant for HTN, hypothyroidism, GERD, COPD, DM, chronic pain, anxiety, depression, and OA. She also had a recent Lt THR on 01/26/19 and continues to be on posterior hip precautions.    PT Comments    Pt continues to participate well.    Follow Up Recommendations  Home health PT;Supervision for mobility/OOB     Equipment Recommendations  None recommended by PT    Recommendations for Other Services       Precautions / Restrictions Precautions Precautions: Posterior Hip;Fall Precaution Comments: THRev post 12/20, Home Oxygen 2 lts at night; JP drain Restrictions Weight Bearing Restrictions: No    Mobility  Bed Mobility   Bed Mobility: Supine to Sit     Supine to sit: Modified independent (Device/Increase time)     General bed mobility comments: out of bed upon arrival  Transfers Overall transfer level: Needs assistance Equipment used: None Transfers: Sit to/from Stand Sit to Stand: Supervision         General transfer comment: no physical assist required to power up  Ambulation/Gait Ambulation/Gait assistance: Min guard Gait Distance (Feet): 200 Feet Assistive device: IV Pole Gait Pattern/deviations: Decreased stride length     General Gait Details: good posture today. slow gait speed. IV used for support. O2 89-91% on RA, dyspnea 2/4   Stairs             Wheelchair Mobility    Modified Rankin (Stroke Patients Only)       Balance Overall balance assessment: Needs assistance Sitting-balance support: Feet supported;No upper extremity supported Sitting balance-Leahy Scale: Good     Standing balance support: No upper extremity supported;During functional activity Standing balance-Leahy Scale: Fair Standing balance  comment: supervision for safety                            Cognition Arousal/Alertness: Awake/alert Behavior During Therapy: WFL for tasks assessed/performed Overall Cognitive Status: Within Functional Limits for tasks assessed                                        Exercises      General Comments General comments (skin integrity, edema, etc.): patient participate in functional ambulation after toileting and sinkside grooming/hygiene, requires increased time with multiple standing rest breaks at supervision level, no AD.       Pertinent Vitals/Pain Pain Assessment: Faces Faces Pain Scale: Hurts a little bit Pain Location: abdomen Pain Descriptors / Indicators: Discomfort Pain Intervention(s): Monitored during session    Home Living                      Prior Function            PT Goals (current goals can now be found in the care plan section) Acute Rehab PT Goals Patient Stated Goal: to get home again Progress towards PT goals: Progressing toward goals    Frequency    Min 3X/week      PT Plan Current plan remains appropriate    Co-evaluation              AM-PAC PT "6 Clicks" Mobility   Outcome Measure  Help needed  turning from your back to your side while in a flat bed without using bedrails?: A Little Help needed moving from lying on your back to sitting on the side of a flat bed without using bedrails?: A Little Help needed moving to and from a bed to a chair (including a wheelchair)?: A Little Help needed standing up from a chair using your arms (e.g., wheelchair or bedside chair)?: A Little Help needed to walk in hospital room?: A Little Help needed climbing 3-5 steps with a railing? : A Little 6 Click Score: 18    End of Session   Activity Tolerance: Patient tolerated treatment well Patient left: in bed;with call bell/phone within reach;with family/visitor present   PT Visit Diagnosis: Difficulty in  walking, not elsewhere classified (R26.2)     Time: [id number] PT Time Calculation (min) (ACUTE ONLY): 14 min  Charges:  $Gait Training: 8-22 mins                        Doreatha Massed, PT Acute Rehabilitation

## 2019-04-12 NOTE — Progress Notes (Signed)
Pt. placed on CPAP @ this time, aware to notify if assistance needed.

## 2019-04-13 ENCOUNTER — Encounter (HOSPITAL_COMMUNITY): Payer: Self-pay | Admitting: Psychiatry

## 2019-04-13 ENCOUNTER — Ambulatory Visit (INDEPENDENT_AMBULATORY_CARE_PROVIDER_SITE_OTHER): Payer: Medicare Other | Admitting: Psychiatry

## 2019-04-13 DIAGNOSIS — R440 Auditory hallucinations: Secondary | ICD-10-CM

## 2019-04-13 DIAGNOSIS — F411 Generalized anxiety disorder: Secondary | ICD-10-CM

## 2019-04-13 DIAGNOSIS — F0630 Mood disorder due to known physiological condition, unspecified: Secondary | ICD-10-CM

## 2019-04-13 DIAGNOSIS — Z87891 Personal history of nicotine dependence: Secondary | ICD-10-CM

## 2019-04-13 DIAGNOSIS — R441 Visual hallucinations: Secondary | ICD-10-CM

## 2019-04-13 DIAGNOSIS — F4312 Post-traumatic stress disorder, chronic: Secondary | ICD-10-CM

## 2019-04-13 LAB — CBC
HCT: 37 % (ref 36.0–46.0)
Hemoglobin: 11 g/dL — ABNORMAL LOW (ref 12.0–15.0)
MCH: 29.9 pg (ref 26.0–34.0)
MCHC: 29.7 g/dL — ABNORMAL LOW (ref 30.0–36.0)
MCV: 100.5 fL — ABNORMAL HIGH (ref 80.0–100.0)
Platelets: 451 10*3/uL — ABNORMAL HIGH (ref 150–400)
RBC: 3.68 MIL/uL — ABNORMAL LOW (ref 3.87–5.11)
RDW: 14.5 % (ref 11.5–15.5)
WBC: 10.8 10*3/uL — ABNORMAL HIGH (ref 4.0–10.5)
nRBC: 0 % (ref 0.0–0.2)

## 2019-04-13 LAB — GLUCOSE, CAPILLARY
Glucose-Capillary: 126 mg/dL — ABNORMAL HIGH (ref 70–99)
Glucose-Capillary: 122 mg/dL — ABNORMAL HIGH (ref 70–99)
Glucose-Capillary: 120 mg/dL — ABNORMAL HIGH (ref 70–99)
Glucose-Capillary: 126 mg/dL — ABNORMAL HIGH (ref 70–99)
Glucose-Capillary: 116 mg/dL — ABNORMAL HIGH (ref 70–99)

## 2019-04-13 MED ORDER — ATENOLOL 25 MG PO TABS
25.0000 mg | ORAL_TABLET | Freq: Every day | ORAL | Status: DC
  Administered 2019-04-13 – 2019-04-14 (×2): 25 mg via ORAL
  Filled 2019-04-13 (×2): qty 1

## 2019-04-13 MED ORDER — INSULIN ASPART 100 UNIT/ML ~~LOC~~ SOLN: 0.0000 [IU] | Freq: Three times a day (TID) | SUBCUTANEOUS | Status: DC

## 2019-04-13 MED ORDER — ATORVASTATIN CALCIUM 20 MG PO TABS
20.0000 mg | ORAL_TABLET | Freq: Every day | ORAL | Status: DC
  Administered 2019-04-13: 20 mg via ORAL
  Filled 2019-04-13: qty 1

## 2019-04-13 NOTE — Plan of Care (Signed)
The patient is having no issues with bowel motility as evidenced by having two bowel movements today so far. The patient is ambulating with supervision to and from the bathroom, which helps improve her bowel motility. The patient continues to complain of pain in her abdominal region, but with pain medication and distractions such as participating in activities that she enjoys, her overall comfort is improving. She is able to ambulate short distances without experiencing pain. The patient has not experienced any injuries during her stay here. She transfers and ambulates well with supervision, uses the pad alarm when sitting in her chair, and is cooperative about leaving the door open to ensure that she remains safe.

## 2019-04-13 NOTE — Progress Notes (Signed)
[facility] Follow up visit  Patient Identification: Joan [name] MRN:  [id number] Date of Evaluation:  04/13/2019 Referral Source:Therapist [name] Chief Complaint:   depression follow up  Visit Diagnosis:    ICD-10-CM   1. Mood disorder in conditions classified elsewhere  F06.30   2. GAD (generalized anxiety disorder)  F41.1   3. Post-traumatic stress disorder, chronic  F43.12       I connected with [name] on 04/13/19 at  4:30 PM EST by telephone and verified that I am speaking with the correct person using two identifiers.  I discussed the limitations of evaluation and management by telemedicine and the availability of in person appointments. The patient expressed understanding and agreed to proceed.  History of Present Illness: Patient is a 59 years old currently widow Caucasian female referred by her therapist for management of mood symptoms, depression possible PTSD and anxiety  She has had another  hip replacement surgery currently . Past  history of drug use including cocaine marijuana and alcohol use that she stopped nearly 8 years ago.  She has been incarcerated and involuntarily committed by her husband at that time to get sober.    At present admitted for appendix surgery and recovering has brothers support seroquel was making her sedate so advised to take only at night, she told the hospital staff Sees shadows but for now some irregular sleep at hospital   Has sufferred from grief and trauma  Worries fluctuate  Aggravating factor; difficult childhood, Grief, difficult relationship with middle kid, hip replacement Modifying factor: siblings, but still has difficult time with them   Duration adult life Severity of depression: denies hopelessness Some hallucinations after husband death, but doing better     Past Psychiatric History: depression, drug use  Previous Psychotropic Medications: Yes   Substance Abuse History in the last 12 months:   No.  Consequences of Substance Abuse: history of extensive drug use uptil 8 years ago and have been in rehab programs  Past Medical History:  Past Medical History:  Diagnosis Date  . Acute respiratory failure with hypoxia ([place])   . Alcoholism and drug addiction in family   . Anxiety   . Arthritis    "knees, hips, lower back" (09/25/2016)  . Asthma   . Bipolar disorder ([place])   . Chronic bronchitis ([place])    "all the time" (09/25/2016)  . Chronic leg pain    RLE  . Chronic lower back pain   . COPD (chronic obstructive pulmonary disease) ([place])   . Depression   . Diabetes mellitus without complication ([place])   . Emphysema of lung ([place])   . GERD (gastroesophageal reflux disease)   . Headache   . Hepatitis C    "treated back in 2001-2002"  . High cholesterol   . Hypertension   . Hypothyroidism   . Incarcerated ventral hernia 04/04/2017  . On home oxygen therapy    "2L; at nighttime" (04/04/2017)  . OSA on CPAP    CPAP with Oxygen 2 liters  . Pneumonia    "all the time" (09/25/2016)  . Positive PPD    with negative chest x ray  . Tachycardia    patient reports with exertion ; denies any other conditions   . Thyroid disease     Past Surgical History:  Procedure Laterality Date  . BACK SURGERY    . BIOPSY  11/24/2018   Procedure: BIOPSY;  Surgeon: Thornton Park, MD;  Location: [facility];  Service: Gastroenterology;;  .  CARDIAC CATHETERIZATION    . COLONOSCOPY    . COLONOSCOPY WITH PROPOFOL N/A 11/24/2018   Procedure: COLONOSCOPY WITH PROPOFOL;  Surgeon: Thornton Park, MD;  Location: [facility];  Service: Gastroenterology;  Laterality: N/A;  . CYST EXCISION     removal of cyst in sinuses  . DILATION AND CURETTAGE OF UTERUS    . ESOPHAGOGASTRODUODENOSCOPY (EGD) WITH PROPOFOL N/A 11/24/2018   Procedure: ESOPHAGOGASTRODUODENOSCOPY (EGD) WITH PROPOFOL;  Surgeon: Thornton Park, MD;  Location: [facility];  Service: Gastroenterology;  Laterality: N/A;  . FOOT SURGERY  Bilateral    bone removed from 4th and 5 th toes and put in pin then took pin out  . HERNIA REPAIR    . INCISION AND DRAINAGE  ~ 2014/2015   "removed mesh & infection"  . INCISIONAL HERNIA REPAIR N/A 04/04/2017   Procedure: LAPAROSCOPIC INCISIONAL HERNIA REPAIR WITH MESH;  Surgeon: Fanny Skates, MD;  Location: [facility];  Service: General;  Laterality: N/A;  . LACERATION REPAIR Right    repair wrist artery from laceration from ice skate  . LAPAROSCOPIC APPENDECTOMY N/A 03/31/2019   Procedure: APPENDECTOMY LAPAROSCOPIC;  Surgeon: Ralene Ok, MD;  Location: [facility];  Service: General;  Laterality: N/A;  . LAPAROSCOPIC INCISIONAL / UMBILICAL / VENTRAL HERNIA REPAIR  04/04/2017   LAPAROSCOPIC INCISIONAL HERNIA REPAIR WITH MESH  . LIPOMA EXCISION Right    fattty lipoma in neck  . NASAL SEPTUM SURGERY    . POSTERIOR LUMBAR FUSION  2015/2016 X 2   "added rods and screws"  . TOTAL HIP ARTHROPLASTY Right 02/05/2017   Procedure: RIGHT TOTAL HIP ARTHROPLASTY;  Surgeon: [name], MD;  Location: [facility];  Service: Orthopedics;  Laterality: Right;  . TOTAL HIP ARTHROPLASTY Left 06/18/2017   Procedure: LEFT TOTAL HIP ARTHROPLASTY;  Surgeon: [name], MD;  Location: [facility];  Service: Orthopedics;  Laterality: Left;  . TOTAL HIP REVISION Left 01/26/2019   Procedure: TOTAL HIP REVISION;  Surgeon: Paralee Cancel, MD;  Location: [facility];  Service: Orthopedics;  Laterality: Left;  2 hrs  . TUBAL LIGATION    . UMBILICAL HERNIA REPAIR  ~ 2013   w/mesh  . UPPER GI ENDOSCOPY      Family Psychiatric History: FAther : alcohol use  Family History:  Family History  Problem Relation Age of Onset  . Diabetes Mother   . Hypertension Mother   . Hypertension Father   . Cancer Father        lung    Social History:   Social History   Socioeconomic History  . Marital status: Widowed    Spouse name: Not on file  . Number of children: 3  . Years of education: Not on file  . Highest education  level: Not on file  Occupational History  . Not on file  Tobacco Use  . Smoking status: Former Smoker    Packs/day: 1.00    Years: 44.00    Pack years: 44.00    Types: Cigarettes    Quit date: 11/30/2018    Years since quitting: 0.3  . Smokeless tobacco: Never Used  . Tobacco comment: Smoker 1-1.5 PPD every day, 11-20-2018 "i got myself down to half a pack", i am gonna be going on chantix on the 28th"  Substance and Sexual Activity  . Alcohol use: Not Currently  . Drug use: Not Currently    Types: "Crack" cocaine    Comment: 04/04/2017 "nothing since 01/12/2012"  . Sexual activity: Never  Other Topics Concern  . Not  on file  Social History Narrative  . Not on file   Social Determinants of Health   Financial Resource Strain:   . Difficulty of Paying Living Expenses: Not on file  Food Insecurity:   . Worried About Charity fundraiser in the Last Year: Not on file  . Ran Out of Food in the Last Year: Not on file  Transportation Needs:   . Lack of Transportation (Medical): Not on file  . Lack of Transportation (Non-Medical): Not on file  Physical Activity:   . Days of Exercise per Week: Not on file  . Minutes of Exercise per Session: Not on file  Stress:   . Feeling of Stress : Not on file  Social Connections:   . Frequency of Communication with Friends and Family: Not on file  . Frequency of Social Gatherings with Friends and Family: Not on file  . Attends Religious Services: Not on file  . Active Member of Clubs or Organizations: Not on file  . Attends Archivist Meetings: Not on file  . Marital Status: Not on file      Allergies:   Allergies  Allergen Reactions  . Keflex [Cephalexin] Other (See Comments)    Rash.  Has tolerated amoxicillin since had keflex  . Prednisone Other (See Comments)    "counter reacts"  . Shellfish Allergy Other (See Comments)    Stomach cramps  . Tetracyclines & Related Anaphylaxis    Throat swelling requiring hospitalization   . Dilaudid [Hydromorphone Hcl] Other (See Comments)    "Lethargy"  . Incruse Ellipta [Umeclidinium Bromide] Nausea Only  . Tuberculin Tests Other (See Comments)    False postive    Metabolic Disorder Labs: Lab Results  Component Value Date   HGBA1C 6.2 (H) 03/31/2019   MPG 131.24 03/31/2019   MPG 154.2 05/22/2018   No results found for: PROLACTIN Lab Results  Component Value Date   CHOL 125 12/17/2018   TRIG 261.0 (H) 12/17/2018   HDL 37.80 (L) 12/17/2018   CHOLHDL 3 12/17/2018   VLDL 52.2 (H) 12/17/2018   LDLCALC 62 08/19/2017   Lab Results  Component Value Date   TSH 1.156 03/30/2019    Therapeutic Level Labs: No results found for: LITHIUM No results found for: CBMZ No results found for: VALPROATE  Current Medications: No current facility-administered medications for this visit.   No current outpatient medications on file.   Facility-Administered Medications Ordered in Other Visits  Medication Dose Route Frequency Provider Last Rate Last Admin  . acetaminophen (TYLENOL) tablet 1,000 mg  1,000 mg Oral Q8H Earnstine Regal, PA-C   1,000 mg at 04/13/19 1612  . antiseptic oral rinse (BIOTENE) solution 15 mL  15 mL Mouth Rinse PRN Regalado, Belkys A, MD   15 mL at 04/05/19 2135  . atenolol (TENORMIN) tablet 25 mg  25 mg Oral Daily Kc, Ramesh, MD   25 mg at 04/13/19 1223  . atorvastatin (LIPITOR) tablet 20 mg  20 mg Oral q1800 Kc, Ramesh, MD      . diphenhydrAMINE (BENADRYL) capsule 25 mg  25 mg Oral [id number] PRN Leighton Ruff, MD      . enoxaparin (LOVENOX) injection 40 mg  40 mg Subcutaneous Daily Focht, Jessica L, PA   40 mg at 04/13/19 0912  . gabapentin (NEURONTIN) capsule 300 mg  300 mg Oral BID Earnstine Regal, PA-C   300 mg at 04/13/19 0913  . insulin aspart (novoLOG) injection 0-9 Units  0-9 Units Subcutaneous Q4H Earnstine Regal,  PA-C   1 Units at 04/13/19 1630  . ipratropium-albuterol (DUONEB) 0.5-2.5 (3) MG/3ML nebulizer solution 3 mL  3 mL Nebulization Q6H  PRN Earnstine Regal, PA-C   3 mL at 04/09/19 2000  . ipratropium-albuterol (DUONEB) 0.5-2.5 (3) MG/3ML nebulizer solution 3 mL  3 mL Nebulization BID Earnstine Regal, PA-C   3 mL at 04/13/19 0929  . levothyroxine (SYNTHROID) tablet 25 mcg  25 mcg Oral Q0600 Earnstine Regal, PA-C   25 mcg at 04/13/19 0534  . LORazepam (ATIVAN) injection 1 mg  1 mg Intravenous Q4H PRN Focht, Jessica L, PA   1 mg at 04/04/19 0242  . mometasone-formoterol (DULERA) 200-5 MCG/ACT inhaler 2 puff  2 puff Inhalation BID Earnstine Regal, PA-C   2 puff at 04/13/19 0810  . morphine 2 MG/ML injection 1-3 mg  1-3 mg Intravenous Q2H PRN Earnstine Regal, PA-C   2 mg at 04/13/19 1444  . nicotine (NICODERM CQ - dosed in mg/24 hours) patch 14 mg  14 mg Transdermal Daily Earnstine Regal, PA-C      . ondansetron (ZOFRAN-ODT) disintegrating tablet 4 mg  4 mg Oral Q6H PRN Earnstine Regal, PA-C       Or  . ondansetron [facility]) injection 4 mg  4 mg Intravenous Q6H PRN Earnstine Regal, PA-C   4 mg at 04/12/19 2252  . oxyCODONE (Oxy IR/ROXICODONE) immediate release tablet 5-10 mg  5-10 mg Oral Q4H PRN Earnstine Regal, PA-C   10 mg at 04/13/19 0536  . pantoprazole (PROTONIX) EC tablet 40 mg  40 mg Oral BID Polly Cobia, RPH   40 mg at 04/13/19 0913  . QUEtiapine (SEROQUEL) tablet 50 mg  50 mg Oral QHS Leighton Ruff, MD   50 mg at 04/12/19 2100  . saccharomyces boulardii (FLORASTOR) capsule 250 mg  250 mg Oral BID Jillyn Ledger, PA-C   250 mg at 04/13/19 0913  . simethicone (MYLICON) 40 [id number] suspension 40 mg  40 mg Oral TID PRN Antonieta Pert, MD   40 mg at 04/12/19 1534  . sodium chloride flush (NS) 0.9 % injection 3 mL  3 mL Intravenous Once Earnstine Regal, PA-C      . traZODone (DESYREL) tablet 100 mg  100 mg Oral QHS Earnstine Regal, PA-C   100 mg at 04/12/19 2100  . venlafaxine XR (EFFEXOR-XR) 24 hr capsule 150 mg  150 mg Oral Q breakfast Earnstine Regal, PA-C   150 mg at 04/13/19 [id number]     Psychiatric  Specialty Exam: Review of Systems  Cardiovascular: Negative for chest pain.  Psychiatric/Behavioral: Negative for substance abuse and suicidal ideas.    There were no vitals taken for this visit.There is no height or weight on file to calculate BMI.  General Appearance:  Eye Contact:    Speech:  Slow  Volume:  Decreased  Mood: fair  Affect:  Congruent  Thought Process:  Goal Directed  Orientation:  Full (Time, Place, and Person)  Thought Content:  Some hallucinations, visual and non commanding auditory  Suicidal Thoughts:  No  Homicidal Thoughts:  No  Memory:  Immediate;   Fair Recent;   Fair  Judgement:  Fair  Insight:  Shallow  Psychomotor Activity:  Decreased  Concentration:  Concentration: Fair and Attention Span: Fair  Recall:  Pleak: Fair  Akathisia:  No  Handed:    AIMS (if indicated): no involuntary movements  Assets:  Desire for Improvement Leisure Time Social Support  ADL's: limited due to recent  surgery  Cognition: WNL  Sleep:  variable   Screenings: Mini-Mental     Clinical Support from 04/06/2018 in [facility]  Total Score (max 30 points )  29    PHQ2-9     Patient Outreach Telephone from 12/18/2018 in [facility] Patient Outreach Telephone from 11/18/2018 in [facility] from 04/06/2018 in [facility] Visit from 11/28/2016 in [facility]  PHQ-2 Total Score  3  1  2  1   PHQ-9 Total Score  --  --  6  --      Assessment and Plan: as follows  Mood disorder unspecified: relavant to multiple medical concerns and past history of using drugs, alcohol Recurrent depressive episodes; doing fair, in hospital due to appendix surgery but depression is manageable  Hallucinations may be part of Grief, PTSD or depression or possible part of schizoaffective disorder. Not worse, seroquel has helped, take at night,  avoid trazadone if too sleepy   Risk side effects discussed Has support of her brother and case/care giver since discharge  Would recommend more frequent therapy for depression, grief     I discussed the assessment and treatment plan with the patient. The patient was provided an opportunity to ask questions and all were answered. The patient agreed with the plan and demonstrated an understanding of the instructions.   The patient was advised to call back or seek an in-person evaluation if the symptoms worsen or if the condition fails to improve as anticipated. Fu 6-8 weeks, call back for refills when due    [name], MD 2/16/20214:51 PM

## 2019-04-13 NOTE — Plan of Care (Signed)
Instructor Attestation:  Instructor Vicenta Dunning, MSN, RN in agreement with the following learner's note:  The patient is having no issues with bowel motility as evidenced by having two bowel movements today so far. The patient is ambulating with supervision to and from the bathroom, which helps improve her bowel motility. The patient continues to complain of pain in her abdominal region, but with pain medication and distractions such as participating in activities that she enjoys, her overall comfort is improving. She is able to ambulate short distances without experiencing pain. The patient has not experienced any injuries during her stay here. She transfers and ambulates well with supervision, uses the pad alarm when sitting in her chair, and is cooperative about leaving the door open to ensure that she remains safe.

## 2019-04-13 NOTE — Progress Notes (Signed)
Pt. seen for CPAP h/s, stated that she was able to place on independently, made aware to notify if needed, oxygen remained placed inline on @ 2lpm, RT to monitor.

## 2019-04-13 NOTE — Progress Notes (Addendum)
Nutrition Brief Note  Patient identified for LOS (day #14)  Wt Readings from Last 15 Encounters:  03/31/19 90.7 kg  03/17/19 90.7 kg  02/13/19 90 kg  01/26/19 90 kg  01/20/19 90.4 kg  12/24/18 89.4 kg  12/17/18 90.3 kg  12/09/18 89.7 kg  11/30/18 90.1 kg  11/26/18 89.4 kg  11/20/18 88.5 kg  11/09/18 89.4 kg  08/24/18 93.1 kg  06/24/18 97.1 kg  05/21/18 98.3 kg    Body mass index is 32.27 kg/m. Patient meets criteria for obesity based on current BMI.   Diet advanced to CLD on 2/7 and to FLD on 2/10 and to Soft on 2/13 at 0830. Per flow sheet documentation, patient consumed 100% of lunch and dinner on 2/13 (total of 1545 kcal, 74 grams protein); 75% of breakfast and 100% of lunch and dinner on 2/14 (total of 2596 kcal, 87 grams protein); 0% of lunch and dinner on 2/15; 50% of breakfast this AM (514 kcal, 21 grams protein).   Patient has not been weighed since 2/3; re-weigh today. Weight has been stable since 11/09/18. Patient was admitted on 2/3 with dx of acute gangrenous appendicitis with perforation.  Labs and medications reviewed.   No nutrition interventions warranted at this time. If nutrition issues arise, please consult RD.     Jarome Matin, MS, RD, LDN, CNSC Inpatient Clinical Dietitian RD pager # available in [place]  After hours/weekend pager # available in [facility]

## 2019-04-13 NOTE — Progress Notes (Signed)
PROGRESS NOTE    Joan [name]  BWL:[id number] DOB: 02-28-1960 DOA: 03/30/2019 PCP: [name], MD   Brief Narrative: 53 YOF with COPD chronic hypoxic respiratory failure on 2 L [place] at night and 2 to 3 L during exertion in day, hypothyroidism, anxiety/bipolar disorder, diet-controlled diabetes, HTN, HLD admitted with right lower quadrant abdominal pain CT showed acute nonruptured appendicitis status post lap appendectomy 2/3. Hospitalist team following for medical management.  Subjective: Had a bowel movement loose this morning.  Complains of still ongoing bloating and abdominal pain which is mostly in upper abdomen.  Reports overall she is feeling better.  She is afebrile, WBC count improving.   She was asking about CT scan plan. Assessment & Plan:   Acute gangrenous appendicitis with perforation: s/p lap appendectomy and Jp drain 03/31/19.Mild leukocytosis 12.2k >12.9- > 10.8k, afebrile. Repeat CT abdomen due to ongoing abdominal pain 2/12 -showed phlegmon-antibiotics switched to meropenem then to Zosyn 2/12. On scheduled Tylenol, pain  Management.If has ongoing pain or worsening leucytosis may need CT abdomen repeated to make sure phlegmon does not develop into abscess.  Continue plan as per surgery. On soft diet, having some loose stool.  Hypokalemia/Hypomagnesemia: Repleted and resolved  Ileus: Patient pulled NG tube.  Tolerating diet ileus improving monitor and replace electrolytes.  Have some bloating sensation likely due to associated ileus.   COPD with chronic hypoxic respiratory failure 2 L  at night and 2 to 3 L during exertion in day : On Dulera, DuoNeb  as needed bronchodilators.  Respiratory status is stable, lungs are clear on exam.  Anxiety/BPD: Continue home Effexor, trazodone, Seroquel  Essential hypertension: Intermittently high in 150s.  Resume atenolol p.o. stop scheduled metoprolol iv since patient is tolerating diet.  Dyslipidemia-resume statin.    Diet-controlled diabetes mellitus: Continue diet control.  Hypothyroidism: on Synthroid.  GERD without esophagitis- cont ppi  Nutrition:  Soft diet since 2/13  Body mass index is 32.27 kg/m.   DVT prophylaxis: lovenox Code Status:full Family Communication: plan of care discussed with patient at bedside.  Sister updated by primary team. Disposition Plan: Patient is from:home Anticipated d/c to: Home health versus SNF next 2-3 days.   Barriers to d/c or conditions that needs to be met prior to d/c: Awaiting diet tolerance return of bowel function and stable postop course.Hospitalist team will continue to follow.  Discharge disposition per surgery  Consultants:TRH. Procedures:S/P LAP APPENDECTOMY. Microbiology:see note. Antimicrobials: Anti-infectives (From admission, onward)   Start     Dose/Rate Route Frequency Ordered Stop   04/09/19 2200  piperacillin-tazobactam (ZOSYN) IVPB 3.375 g     3.375 g 12.5 mL/hr over 240 Minutes Intravenous Every 8 hours 04/09/19 1542     04/09/19 1600  piperacillin-tazobactam (ZOSYN) IVPB 3.375 g     3.375 g 100 mL/hr over 30 Minutes Intravenous  Once 04/09/19 1533 04/09/19 1651   04/08/19 1400  meropenem (MERREM) 1 g in sodium chloride 0.9 % 100 mL IVPB  Status:  Discontinued     1 g 200 mL/hr over 30 Minutes Intravenous Every 8 hours 04/08/19 1335 04/09/19 1533   03/31/19 0500  ciprofloxacin (CIPRO) IVPB 400 mg  Status:  Discontinued     400 mg 200 mL/hr over 60 Minutes Intravenous Every 12 hours 03/30/19 2242 04/08/19 1320   03/31/19 0200  metroNIDAZOLE (FLAGYL) IVPB 500 mg  Status:  Discontinued     500 mg 100 mL/hr over 60 Minutes Intravenous Every 8 hours 03/30/19 2242 04/08/19 1320   03/30/19  1630  ciprofloxacin (CIPRO) IVPB 400 mg     400 mg 200 mL/hr over 60 Minutes Intravenous  Once 03/30/19 1620 03/30/19 1914   03/30/19 1630  metroNIDAZOLE (FLAGYL) IVPB 500 mg     500 mg 100 mL/hr over 60 Minutes Intravenous  Once 03/30/19 1620  03/30/19 1914      Medications: Scheduled Meds: . acetaminophen  1,000 mg Oral Q8H  . enoxaparin (LOVENOX) injection  40 mg Subcutaneous Daily  . gabapentin  300 mg Oral BID  . insulin aspart  0-9 Units Subcutaneous Q4H  . ipratropium-albuterol  3 mL Nebulization BID  . levothyroxine  25 mcg Oral Q0600  . metoprolol tartrate  2.5 mg Intravenous Q6H  . mometasone-formoterol  2 puff Inhalation BID  . nicotine  14 mg Transdermal Daily  . pantoprazole  40 mg Oral BID  . QUEtiapine  50 mg Oral QHS  . saccharomyces boulardii  250 mg Oral BID  . sodium chloride flush  3 mL Intravenous Once  . traZODone  100 mg Oral QHS  . venlafaxine XR  150 mg Oral Q breakfast   Continuous Infusions: . piperacillin-tazobactam (ZOSYN)  IV 3.375 g (04/13/19 0534)    Objective: Vitals:   04/12/19 2023 04/12/19 2349 04/13/19 0516 04/13/19 0930  BP: (!) 152/78 122/74 126/84   Pulse: 78 76 86   Resp: 18  18   Temp: 98.3 F (36.8 C)  98.1 F (36.7 C)   TempSrc: Oral  Oral   SpO2: 95%  92% 92%  Weight:      Height:        Intake/Output Summary (Last 24 hours) at 04/13/2019 1045 Last data filed at 04/13/2019 1000 Gross per 24 hour  Intake 1353.84 ml  Output 95 ml  Net 1258.84 ml   Filed Weights   03/30/19 1259 03/31/19 1135  Weight: 90.7 kg 90.7 kg   Weight change:   Body mass index is 32.27 kg/m.  Intake/Output from previous day: 02/15 0701 - 02/16 0700 In: 1355.2 [P.O.:1200; IV Piggyback:155.2] Out: 75 [Drains:75] Intake/Output this shift: Total I/O In: 164.7 [P.O.:120; IV Piggyback:44.7] Out: 20 [Drains:20]  Examination:  General exam: AAOx3 , NAD, weak appearing. HEENT:Oral mucosa moist, Ear/Nose WNL grossly, dentition normal. Respiratory system: bilaterally clear, diminished at the base, no wheezing or crackles,no use of accessory muscle Cardiovascular system: S1 & S2 +, No JVD,. Gastrointestinal system: Abdomen soft, mildly distended, JP drain present, surgical site  dressed, BS slugish Nervous System:Alert, awake, moving extremities and grossly nonfocal Extremities: No edema, distal peripheral pulses palpable.  Skin: No rashes,no icterus. MSK: Normal muscle bulk,tone, power  Data Reviewed: I have personally reviewed following labs and imaging studies  CBC: Recent Labs  Lab 04/09/19 0350 04/10/19 0340 04/11/19 0449 04/12/19 0357 04/13/19 0728  WBC 12.2* 11.7* 12.9* 12.2* 10.8*  HGB 11.6* 9.9* 10.5* 10.4* 11.0*  HCT 38.0 32.5* 33.8* 34.1* 37.0  MCV 98.2 99.1 99.1 98.0 100.5*  PLT 402* 362 431* 429* 831*   Basic Metabolic Panel: Recent Labs  Lab 04/08/19 0434 04/09/19 0350 04/10/19 0340 04/11/19 0449 04/12/19 0357  NA 145 140 142 145 141  K 3.5 3.8 3.8 3.6 4.1  CL 111 110 110 107 107  CO2 27 23 24 29 26   GLUCOSE 117* 136* 111* 103* 125*  BUN 5* <5* 6 10 12   CREATININE 0.75 0.73 0.81 0.87 0.81  CALCIUM 8.4* 8.3* 7.9* 8.5* 8.1*  MG  --   --   --  1.4* 2.0  GFR: Estimated Creatinine Clearance: 87 mL/min (by C-G formula based on SCr of 0.81 mg/dL). Liver Function Tests: No results for input(s): AST, ALT, ALKPHOS, BILITOT, PROT, ALBUMIN in the last 168 hours. No results for input(s): LIPASE, AMYLASE in the last 168 hours. No results for input(s): AMMONIA in the last 168 hours. Coagulation Profile: No results for input(s): INR, PROTIME in the last 168 hours. Cardiac Enzymes: No results for input(s): CKTOTAL, CKMB, CKMBINDEX, TROPONINI in the last 168 hours. BNP (last 3 results) No results for input(s): PROBNP in the last 8760 hours. HbA1C: No results for input(s): HGBA1C in the last 72 hours. CBG: Recent Labs  Lab 04/12/19 1548 04/12/19 2007 04/12/19 2344 04/13/19 0408 04/13/19 0727  GLUCAP 110* 101* 132* 126* 122*   Lipid Profile: No results for input(s): CHOL, HDL, LDLCALC, TRIG, CHOLHDL, LDLDIRECT in the last 72 hours. Thyroid Function Tests: No results for input(s): TSH, T4TOTAL, FREET4, T3FREE, THYROIDAB in the  last 72 hours. Anemia Panel: No results for input(s): VITAMINB12, FOLATE, FERRITIN, TIBC, IRON, RETICCTPCT in the last 72 hours. Sepsis Labs: No results for input(s): PROCALCITON, LATICACIDVEN in the last 168 hours.  No results found for this or any previous visit (from the past 240 hour(s)).    Radiology Studies: No results found.   LOS: 14 days   Time spent: More than 50% of that time was spent in counseling and/or coordination of care.  Antonieta Pert, MD [facility]  04/13/2019, 10:45 AM

## 2019-04-13 NOTE — Progress Notes (Signed)
13 Days Post-Op  Subjective: CC: No change- pain about the same. No issues with PO.  States her bloating is back to her baseline  Objective: Vital signs in last 24 hours: Temp:  [98.1 F (36.7 C)-98.3 F (36.8 C)] 98.1 F (36.7 C) (02/16 0516) Pulse Rate:  [76-86] 86 (02/16 0516) Resp:  [18] 18 (02/16 0516) BP: (122-152)/(74-93) 126/84 (02/16 0516) SpO2:  [92 %-95 %] 92 % (02/16 0930) Last BM Date: 04/13/19  Intake/Output from previous day: 02/15 0701 - 02/16 0700 In: 1355.2 [P.O.:1200; IV Piggyback:155.2] Out: 75 [Drains:75] Intake/Output this shift: Total I/O In: 164.7 [P.O.:120; IV Piggyback:44.7] Out: 20 [Drains:20]  PE: Gen: Alert, NAD Abdomen soft, Nontender.  JP serous. Incisions c/d/i   Lab Results:  Recent Labs    04/12/19 0357 04/13/19 0728  WBC 12.2* 10.8*  HGB 10.4* 11.0*  HCT 34.1* 37.0  PLT 429* 451*   BMET Recent Labs    04/11/19 0449 04/12/19 0357  NA 145 141  K 3.6 4.1  CL 107 107  CO2 29 26  GLUCOSE 103* 125*  BUN 10 12  CREATININE 0.87 0.81  CALCIUM 8.5* 8.1*   PT/INR No results for input(s): LABPROT, INR in the last 72 hours. CMP     Component Value Date/Time   NA 141 04/12/2019 0357   NA 140 12/09/2018 1133   K 4.1 04/12/2019 0357   CL 107 04/12/2019 0357   CO2 26 04/12/2019 0357   GLUCOSE 125 (H) 04/12/2019 0357   BUN 12 04/12/2019 0357   BUN 18 12/09/2018 1133   CREATININE 0.81 04/12/2019 0357   CALCIUM 8.1 (L) 04/12/2019 0357   PROT 7.6 03/31/2019 0417   PROT 6.5 08/19/2017 0822   ALBUMIN 4.3 03/31/2019 0417   ALBUMIN 4.5 08/19/2017 0822   AST 20 03/31/2019 0417   ALT 28 03/31/2019 0417   ALKPHOS 125 03/31/2019 0417   BILITOT 1.2 03/31/2019 0417   BILITOT 0.3 08/19/2017 0822   GFRNONAA >60 04/12/2019 0357   GFRAA >60 04/12/2019 0357   Lipase     Component Value Date/Time   LIPASE 21 03/30/2019 1336       Studies/Results: No results found.  Anti-infectives: Anti-infectives (From admission,  onward)   Start     Dose/Rate Route Frequency Ordered Stop   04/09/19 2200  piperacillin-tazobactam (ZOSYN) IVPB 3.375 g     3.375 g 12.5 mL/hr over 240 Minutes Intravenous Every 8 hours 04/09/19 1542     04/09/19 1600  piperacillin-tazobactam (ZOSYN) IVPB 3.375 g     3.375 g 100 mL/hr over 30 Minutes Intravenous  Once 04/09/19 1533 04/09/19 1651   04/08/19 1400  meropenem (MERREM) 1 g in sodium chloride 0.9 % 100 mL IVPB  Status:  Discontinued     1 g 200 mL/hr over 30 Minutes Intravenous Every 8 hours 04/08/19 1335 04/09/19 1533   03/31/19 0500  ciprofloxacin (CIPRO) IVPB 400 mg  Status:  Discontinued     400 mg 200 mL/hr over 60 Minutes Intravenous Every 12 hours 03/30/19 2242 04/08/19 1320   03/31/19 0200  metroNIDAZOLE (FLAGYL) IVPB 500 mg  Status:  Discontinued     500 mg 100 mL/hr over 60 Minutes Intravenous Every 8 hours 03/30/19 2242 04/08/19 1320   03/30/19 1630  ciprofloxacin (CIPRO) IVPB 400 mg     400 mg 200 mL/hr over 60 Minutes Intravenous  Once 03/30/19 1620 03/30/19 1914   03/30/19 1630  metroNIDAZOLE (FLAGYL) IVPB 500 mg     500  mg 100 mL/hr over 60 Minutes Intravenous  Once 03/30/19 1620 03/30/19 1914       Assessment/Plan Severe COPDon home O2 Severe medical comorbidities. Appreciate medical assistance in managing these.  Acute gangrenous appendicitis, perforation - S/P lap appy, JP drain, Dr. Rosendo Gros, 02/03;  CT 2/11 w/ long retrocecal appendiceal stump with 3.8x 3.2cm phlegmon - Cont IV abx. Changed to [facility] 2/11.  WBC down today.   [id number]. Hypokalemia- replace. Hypomagnesemia- replace. VTE: SCD's, lovenox [id number] &Flagyl 02/02 - 2/11. Merrem 2/11 >>zosyn 2/12>>>>  Afebrile on scheduled tylenol.  She has had almost 2 weeks of abx today.  Will stop today and recheck wbc in AM. Foley:None Follow up:Dr. Pamala Duffel: sister [name] [phone])    LOS: 66 days    Joan [name] , MD [facility] 04/13/2019, 11:30 AM Please  see Amion for pager number during day hours 7:00am-4:30pm

## 2019-04-14 ENCOUNTER — Ambulatory Visit: Payer: Self-pay | Admitting: *Deleted

## 2019-04-14 LAB — GLUCOSE, CAPILLARY: Glucose-Capillary: 111 mg/dL — ABNORMAL HIGH (ref 70–99)

## 2019-04-14 LAB — CBC
HCT: 32.8 % — ABNORMAL LOW (ref 36.0–46.0)
Hemoglobin: 9.9 g/dL — ABNORMAL LOW (ref 12.0–15.0)
MCH: 29.7 pg (ref 26.0–34.0)
MCHC: 30.2 g/dL (ref 30.0–36.0)
MCV: 98.5 fL (ref 80.0–100.0)
Platelets: 431 10*3/uL — ABNORMAL HIGH (ref 150–400)
RBC: 3.33 MIL/uL — ABNORMAL LOW (ref 3.87–5.11)
RDW: 14.3 % (ref 11.5–15.5)
WBC: 9.3 10*3/uL (ref 4.0–10.5)
nRBC: 0 % (ref 0.0–0.2)

## 2019-04-14 LAB — COMPREHENSIVE METABOLIC PANEL
ALT: 12 U/L (ref 0–44)
AST: 14 U/L — ABNORMAL LOW (ref 15–41)
Albumin: 2.9 g/dL — ABNORMAL LOW (ref 3.5–5.0)
Alkaline Phosphatase: 113 U/L (ref 38–126)
Anion gap: 7 (ref 5–15)
BUN: 16 mg/dL (ref 6–20)
CO2: 27 mmol/L (ref 22–32)
Calcium: 8.4 mg/dL — ABNORMAL LOW (ref 8.9–10.3)
Chloride: 110 mmol/L (ref 98–111)
Creatinine, Ser: 0.83 mg/dL (ref 0.44–1.00)
GFR calc Af Amer: >60 mL/min (ref >60)
GFR calc non Af Amer: >60 mL/min (ref >60)
Glucose, Bld: 137 mg/dL — ABNORMAL HIGH (ref 70–99)
Potassium: 3.7 mmol/L (ref 3.5–5.1)
Sodium: 144 mmol/L (ref 135–145)
Total Bilirubin: 0.2 mg/dL — ABNORMAL LOW (ref 0.3–1.2)
Total Protein: 5.7 g/dL — ABNORMAL LOW (ref 6.5–8.1)

## 2019-04-14 MED ORDER — OXYCODONE HCL 5 MG PO TABS: 5.0000 mg | ORAL_TABLET | ORAL | Status: DC | PRN

## 2019-04-14 MED ORDER — OXYCODONE HCL 5 MG PO TABS: 5.0000 mg | ORAL_TABLET | Freq: Four times a day (QID) | ORAL | 0 refills | Status: DC | PRN

## 2019-04-14 MED ORDER — MORPHINE SULFATE (PF) 2 MG/ML IV SOLN
1.0000 mg | INTRAVENOUS | Status: DC | PRN
  Administered 2019-04-14: 1 mg via INTRAVENOUS
  Filled 2019-04-14: qty 1

## 2019-04-14 MED ORDER — SACCHAROMYCES BOULARDII 250 MG PO CAPS: 250.0000 mg | ORAL_CAPSULE | Freq: Two times a day (BID) | ORAL | Status: DC

## 2019-04-14 MED ORDER — ACETAMINOPHEN 500 MG PO TABS: 1000.0000 mg | ORAL_TABLET | Freq: Three times a day (TID) | ORAL | 0 refills | Status: DC | PRN

## 2019-04-14 MED ORDER — POLYETHYLENE GLYCOL 3350 17 G PO PACK: 17.0000 g | PACK | Freq: Every day | ORAL | 0 refills | Status: DC | PRN

## 2019-04-14 NOTE — Progress Notes (Signed)
PROGRESS NOTE    Joan [name]  YHC:[id number] DOB: Mar 19, 1960 DOA: 03/30/2019 PCP: [name], MD   Brief Narrative: 37 YOF with COPD chronic hypoxic respiratory failure on 2 L [place] at night and 2 to 3 L during exertion in day, hypothyroidism, anxiety/bipolar disorder, diet-controlled diabetes, HTN, HLD admitted with right lower quadrant abdominal pain CT showed acute nonruptured appendicitis status post lap appendectomy 2/3. Hospitalist team following for medical management.  Subjective:  Reports she is tolerating diet but with loose stool.  Still has similar abdominal discomfort mostly in the right lower quadrant left upper quadrant.  She feels she is getting better.  Afebrile with T-max 99.  Assessment & Plan:   Acute gangrenous appendicitis with perforation: s/p lap appendectomy and Jp drain 03/31/19.Mild leukocytosis 12.2k >12.9- > 10.8k, afebrile. Repeat CT abdomen due to ongoing abdominal pain 2/12 -showed phlegmon-antibiotics switched to meropenem then to Zosyn 2/12. On scheduled Tylenol, pain  Management.If has ongoing pain or worsening leucytosis may need CT abdomen repeated to make sure phlegmon does not develop into abscess.  Overall patient is doing well afebrile, her leukocytosis has resolved.  Cont soft diet per surgery.  Hypokalemia/Hypomagnesemia: Level improved.    Ileus: Patient pulled NG tube.  Tolerating diet ileus improving in bowel movement low-dose,.  Continue plan per surgery.    COPD with chronic hypoxic respiratory failure 2 L [place] at night and 2 to 3 L during exertion in day : On Dulera, DuoNeb  as needed bronchodilators.  Respiratory status is stable, lungs are clear on exam.  Encourage incentive spirometry..  Anxiety/BPD: Continue home Effexor, trazodone, Seroquel  Essential hypertension: Intermittently high in 150s.  Resume atenolol p.o. stop scheduled metoprolol iv since patient is tolerating diet.  Dyslipidemia-resume statin.   Diet-controlled  diabetes mellitus:diet controlled.  Hypothyroidism: on Synthroid.  GERD without esophagitis- cont ppi  Nutrition:  Soft diet since 2/13  Body mass index is 35.35 kg/m.   DVT prophylaxis: lovenox Code Status:full Family Communication: plan of care discussed with patient at bedside.  Sister updated by primary team. Disposition Plan: Patient is from:home Anticipated d/c to: Home health versus SNF next 2-3 days.   Barriers to d/c or conditions that needs to be met prior to d/c: Awaiting diet tolerance return of bowel function and stable postop course.Hospitalist team will continue to follow.  Discharge disposition per surgery  Consultants:TRH. Procedures:S/P LAP APPENDECTOMY. Microbiology:see note. Antimicrobials: Anti-infectives (From admission, onward)   Start     Dose/Rate Route Frequency Ordered Stop   04/09/19 2200  piperacillin-tazobactam (ZOSYN) IVPB 3.375 g  Status:  Discontinued     3.375 g 12.5 mL/hr over 240 Minutes Intravenous Every 8 hours 04/09/19 1542 04/13/19 1141   04/09/19 1600  piperacillin-tazobactam (ZOSYN) IVPB 3.375 g     3.375 g 100 mL/hr over 30 Minutes Intravenous  Once 04/09/19 1533 04/09/19 1651   04/08/19 1400  meropenem (MERREM) 1 g in sodium chloride 0.9 % 100 mL IVPB  Status:  Discontinued     1 g 200 mL/hr over 30 Minutes Intravenous Every 8 hours 04/08/19 1335 04/09/19 1533   03/31/19 0500  ciprofloxacin (CIPRO) IVPB 400 mg  Status:  Discontinued     400 mg 200 mL/hr over 60 Minutes Intravenous Every 12 hours 03/30/19 2242 04/08/19 1320   03/31/19 0200  metroNIDAZOLE (FLAGYL) IVPB 500 mg  Status:  Discontinued     500 mg 100 mL/hr over 60 Minutes Intravenous Every 8 hours 03/30/19 2242 04/08/19 1320   03/30/19  1630  ciprofloxacin (CIPRO) IVPB 400 mg     400 mg 200 mL/hr over 60 Minutes Intravenous  Once 03/30/19 1620 03/30/19 1914   03/30/19 1630  metroNIDAZOLE (FLAGYL) IVPB 500 mg     500 mg 100 mL/hr over 60 Minutes Intravenous  Once  03/30/19 1620 03/30/19 1914      Medications: Scheduled Meds: . acetaminophen  1,000 mg Oral Q8H  . atenolol  25 mg Oral Daily  . atorvastatin  20 mg Oral q1800  . enoxaparin (LOVENOX) injection  40 mg Subcutaneous Daily  . gabapentin  300 mg Oral BID  . insulin aspart  0-9 Units Subcutaneous TID AC & HS  . ipratropium-albuterol  3 mL Nebulization BID  . levothyroxine  25 mcg Oral Q0600  . mometasone-formoterol  2 puff Inhalation BID  . nicotine  14 mg Transdermal Daily  . pantoprazole  40 mg Oral BID  . QUEtiapine  50 mg Oral QHS  . saccharomyces boulardii  250 mg Oral BID  . sodium chloride flush  3 mL Intravenous Once  . traZODone  100 mg Oral QHS  . venlafaxine XR  150 mg Oral Q breakfast   Continuous Infusions:   Objective: Vitals:   04/13/19 2001 04/13/19 2050 04/14/19 0535 04/14/19 0757  BP:  128/80 122/80   Pulse:  77 78   Resp:  18 16   Temp:  99 F (37.2 C) 98.2 F (36.8 C)   TempSrc:  Oral Oral   SpO2: 96% 96% 96% 98%  Weight:      Height:        Intake/Output Summary (Last 24 hours) at 04/14/2019 0956 Last data filed at 04/14/2019 0400 Gross per 24 hour  Intake 764.66 ml  Output 28 ml  Net 736.66 ml   Filed Weights   03/30/19 1259 03/31/19 1135 04/13/19 1350  Weight: 90.7 kg 90.7 kg 99.3 kg   Weight change:   Body mass index is 35.35 kg/m.  Intake/Output from previous day: 02/16 0701 - 02/17 0700 In: 1004.7 [P.O.:960; IV Piggyback:44.7] Out: 28 [Urine:3; Drains:25] Intake/Output this shift: No intake/output data recorded.  Examination:  General exam: Alert awake oriented x3, not in acute distress.  On edge of bed HEENT:Oral mucosa moist, Ear/Nose WNL grossly, dentition normal. Respiratory system: bilaterally clear,no wheezing or crackles,no use of accessory muscle Cardiovascular system: S1 & S2 +, No JVD,. Gastrointestinal system: Abdomen soft, obese, mild tenderness, JP drain intact,BS + Nervous System:Alert, awake, moving extremities  and grossly nonfocal Extremities: No edema, distal peripheral pulses palpable.  Skin: No rashes,no icterus. MSK: Normal muscle bulk,tone, power  Data Reviewed: I have personally reviewed following labs and imaging studies  CBC: Recent Labs  Lab 04/10/19 0340 04/11/19 0449 04/12/19 0357 04/13/19 0728 04/14/19 0343  WBC 11.7* 12.9* 12.2* 10.8* 9.3  HGB 9.9* 10.5* 10.4* 11.0* 9.9*  HCT 32.5* 33.8* 34.1* 37.0 32.8*  MCV 99.1 99.1 98.0 100.5* 98.5  PLT 362 431* 429* 451* 712*   Basic Metabolic Panel: Recent Labs  Lab 04/09/19 0350 04/10/19 0340 04/11/19 0449 04/12/19 0357 04/14/19 0343  NA 140 142 145 141 144  K 3.8 3.8 3.6 4.1 3.7  CL 110 110 107 107 110  CO2 _0 GLUCOSE 136* 111* 103* 125* 137*  BUN <5* _1 CREATININE 0.73 0.81 0.87 0.81 0.83  CALCIUM 8.3* 7.9* 8.5* 8.1* 8.4*  MG  --   --  1.4* 2.0  --    GFR: Estimated  Creatinine Clearance: 88.9 mL/min (by C-G formula based on SCr of 0.83 mg/dL). Liver Function Tests: Recent Labs  Lab 04/14/19 0343  AST 14*  ALT 12  ALKPHOS 113  BILITOT 0.2*  PROT 5.7*  ALBUMIN 2.9*   No results for input(s): LIPASE, AMYLASE in the last 168 hours. No results for input(s): AMMONIA in the last 168 hours. Coagulation Profile: No results for input(s): INR, PROTIME in the last 168 hours. Cardiac Enzymes: No results for input(s): CKTOTAL, CKMB, CKMBINDEX, TROPONINI in the last 168 hours. BNP (last 3 results) No results for input(s): PROBNP in the last 8760 hours. HbA1C: No results for input(s): HGBA1C in the last 72 hours. CBG: Recent Labs  Lab 04/13/19 0727 04/13/19 1117 04/13/19 1621 04/13/19 2127 04/14/19 0732  GLUCAP 122* 120* 126* 116* 111*   Lipid Profile: No results for input(s): CHOL, HDL, LDLCALC, TRIG, CHOLHDL, LDLDIRECT in the last 72 hours. Thyroid Function Tests: No results for input(s): TSH, T4TOTAL, FREET4, T3FREE, THYROIDAB in the last 72 hours. Anemia Panel: No results for  input(s): VITAMINB12, FOLATE, FERRITIN, TIBC, IRON, RETICCTPCT in the last 72 hours. Sepsis Labs: No results for input(s): PROCALCITON, LATICACIDVEN in the last 168 hours.  No results found for this or any previous visit (from the past 240 hour(s)).    Radiology Studies: No results found.   LOS: 15 days   Time spent: More than 50% of that time was spent in counseling and/or coordination of care.  Antonieta Pert, MD [facility]  04/14/2019, 9:56 AM

## 2019-04-14 NOTE — Discharge Summary (Signed)
[facility] Discharge Summary   Patient ID: Joan [name] MRN: [id number] DOB/AGE: June 10, 1960 59 y.o.  Admit date: 03/30/2019 Discharge date: 04/14/2019  Admitting Diagnosis: Acute appendicitis with questionable perforation   Discharge Diagnosis Acute gangrenous appendicitis, perforated Patient Active Problem List   Diagnosis Date Noted  . Acute appendicitis 03/30/2019  . COPD (chronic obstructive pulmonary disease) ([place]) 03/30/2019  . Obese 01/27/2019  . S/P left TH revision 01/26/2019  . Neck pain 12/18/2018  . Chest pain of uncertain etiology [id number]  . Hypertension   . Medication management 11/06/2018  . Acute on chronic respiratory failure with hypoxia ([place]) 05/21/2018  . Cough 03/25/2018  . Tobacco abuse 01/15/2018  . Hypomagnesemia 12/29/2017  . COPD exacerbation ([place]) 12/28/2017  . Insomnia 12/11/2017  . Pulmonary nodule 11/11/2017  . Carotid artery disease ([place]) 11/05/2017  . Hoarseness 09/09/2017  . OSA on CPAP 08/07/2017  . Family history of heart disease 07/18/2017  . GERD without esophagitis 06/23/2017  . H/O total hip arthroplasty, left 06/18/2017  . Hypothyroidism due to acquired atrophy of thyroid 04/05/2017  . Primary osteoarthritis involving multiple joints 04/05/2017  . Incarcerated ventral hernia 04/04/2017  . Chronic constipation 02/10/2017  . Iron deficiency anemia due to dietary causes 02/10/2017  . Osteoarthritis 02/10/2017  . Dyslipidemia 02/10/2017  . H/O total hip arthroplasty, right 02/05/2017  . Rash 01/08/2017  . Pre-operative clearance 12/16/2016  . Chronic respiratory failure with hypoxia ([place]) 12/16/2016  . Essential hypertension 11/29/2016  . Back pain 11/29/2016  . Vocal cord dysfunction   . COPD, severe ([place]) 09/24/2016  . Anxiety 09/24/2016  . Steroid-induced diabetes ([place]) 09/24/2016    Consultants Hospitalist  Imaging: No results found.  Procedures Dr. Rosendo Gros (03/31/2019) - Laparoscopic  Appendectomy  Hospital Course:  Joan [name] is a 59yo female PMH HTN, COPD, DM, anxiety/bipolar disorder who presented to [facility] with a 2 month history of lower abdominal pain that acutely worsened and localized to the RLQ.  CT scan revealed appendicitis with possible perforation. Patient was admitted and underwent procedure listed above. Intraoperatively she was found to have acute gangrenous perforated appendicitis. Tolerated procedure well and was transferred to the floor. Patient was kept on IV ciprofloxacin/flagyl postoperatively. Hospitalist service was consulted for assistance with management of her multiple medical problems. Due to prolonged ileus a CT scan was repeated on 2/11 which showed a long retrocecal appendiceal stump with 3.8x 3.2cm phlegmon. Antibiotics were switched to IV zosyn. Bowel function returned and diet was advanced as tolerated. WBC monitored and leukocytosis resolved. IV zosyn was stopped on 2/16. On POD14 the patient was voiding well, tolerating diet, ambulating well, pain well controlled, vital signs stable, incisions c/d/i and felt stable for discharge home.  JP drain removed prior to discharge as output was serous and low volume. Patient will follow up as below and knows to call with questions or concerns.    I have personally reviewed the patients medication history on the [place] controlled substance database.    Physical Exam: General:  Alert, NAD, pleasant, comfortable Pulm: rate and effort normal Abd:  Soft, ND, nontender, multiple lap incisions C/D/I, drain with serous drainage >> JP drain removed and dry dressing applied  Allergies as of 04/14/2019      Reactions   Keflex [cephalexin] Other (See Comments)   Rash.  Has tolerated amoxicillin since had keflex   Prednisone Other (See Comments)   "counter reacts"   Shellfish Allergy Other (See Comments)   Stomach cramps   Tetracyclines &  Related Anaphylaxis   Throat swelling requiring hospitalization   Dilaudid  [hydromorphone Hcl] Other (See Comments)   "Lethargy"   Incruse Ellipta [umeclidinium Bromide] Nausea Only   Tuberculin Tests Other (See Comments)   False postive      Medication List    STOP taking these medications   cyclobenzaprine 10 MG tablet Commonly known as: FLEXERIL   HYDROcodone-acetaminophen 5-325 MG tablet Commonly known as: NORCO/VICODIN   HYDROcodone-acetaminophen 7.5-325 MG tablet Commonly known as: Norco   rivaroxaban 10 MG Tabs tablet Commonly known as: Xarelto     TAKE these medications   acetaminophen 500 MG tablet Commonly known as: TYLENOL Take 2 tablets (1,000 mg total) by mouth every 8 (eight) hours as needed for mild pain.   acyclovir 400 MG tablet Commonly known as: ZOVIRAX Take 1 tablet (400 mg total) by mouth 2 (two) times daily. What changed: when to take this   albuterol (2.5 MG/3ML) 0.083% nebulizer solution Commonly known as: PROVENTIL Take 3 mLs (2.5 mg total) by nebulization 2 (two) times daily. And every 6 hours as needed.  DX: J44.9 What changed:   when to take this  reasons to take this   atenolol 25 MG tablet Commonly known as: TENORMIN TAKE 1 TABLET BY MOUTH  DAILY   atorvastatin 20 MG tablet Commonly known as: LIPITOR TAKE 1 TABLET BY MOUTH  DAILY What changed: when to take this   budesonide-formoterol 160-4.5 MCG/ACT inhaler Commonly known as: Symbicort Inhale 2 puffs into the lungs 2 (two) times daily.   buPROPion 150 MG 24 hr tablet Commonly known as: Wellbutrin XL Take 1 tablet (150 mg total) by mouth 2 (two) times daily. What changed: when to take this   Chantix Starting Month Pak 0.5 MG X 11 & 1 MG X 42 tablet Generic drug: varenicline Take one 0.5 mg tablet by mouth once daily for 3 days, then increase to one 0.5 mg tablet twice daily for 4 days, then increase to one 1 mg tablet twice daily. What changed:   how much to take  how to take this  when to take this  additional instructions   Daliresp 500  MCG Tabs tablet Generic drug: roflumilast Take 1 tablet (500 mcg total) by mouth daily.   diphenhydrAMINE 25 MG tablet Commonly known as: BENADRYL Take 25 mg by mouth 2 (two) times daily.   docusate sodium 100 MG capsule Commonly known as: Colace Take 1 capsule (100 mg total) by mouth 2 (two) times daily.   ferrous sulfate 325 (65 FE) MG tablet Commonly known as: FerrouSul Take 1 tablet (325 mg total) by mouth 3 (three) times daily with meals for 14 days.   gabapentin 300 MG capsule Commonly known as: NEURONTIN TAKE 1 CAPSULE BY MOUTH  TWICE DAILY What changed:   how much to take  when to take this   hydrochlorothiazide 25 MG tablet Commonly known as: HYDRODIURIL TAKE 1 TABLET BY MOUTH  DAILY   Ipratropium-Albuterol 20-100 MCG/ACT Aers respimat Commonly known as: Combivent Respimat Inhale 1 puff into the lungs every 6 (six) hours as needed for wheezing. What changed: how much to take   levothyroxine 25 MCG tablet Commonly known as: SYNTHROID TAKE 1 TABLET BY MOUTH  DAILY BEFORE BREAKFAST What changed: See the new instructions.   Melatonin 10 MG Tabs Take 10 mg by mouth at bedtime.   montelukast 10 MG tablet Commonly known as: SINGULAIR TAKE 1 TABLET BY MOUTH AT  BEDTIME   nicotine 14 mg/24hr patch  Commonly known as: Nicoderm CQ Place 1 patch (14 mg total) onto the skin daily.   nystatin-triamcinolone ointment Commonly known as: MYCOLOG Apply 1 application topically 2 (two) times daily as needed (applied to skin folds/groins/under breast skin irritation rash.).   oxyCODONE 5 MG immediate release tablet Commonly known as: Oxy IR/ROXICODONE Take 1 tablet (5 mg total) by mouth every 6 (six) hours as needed for severe pain.   OXYGEN Inhale 2 L into the lungs at bedtime. May use 2.5 - 3 L during the day with strenuous activity Plugged it to CPAP   pantoprazole 40 MG tablet Commonly known as: PROTONIX TAKE 1 TABLET BY MOUTH  DAILY   polyethylene glycol 17 g  packet Commonly known as: MIRALAX / GLYCOLAX Take 17 g by mouth daily as needed for mild constipation. What changed:   when to take this  reasons to take this   QUEtiapine 50 MG tablet Commonly known as: SEROQUEL TAKE 1 TABLET BY MOUTH EVERYDAY AT BEDTIME What changed: See the new instructions.   QUEtiapine 25 MG tablet Commonly known as: SEROQUEL Take 2 tablets (50 mg total) by mouth daily. What changed: Another medication with the same name was changed. Make sure you understand how and when to take each.   saccharomyces boulardii 250 MG capsule Commonly known as: FLORASTOR Take 1 capsule (250 mg total) by mouth 2 (two) times daily. You can find a probiotic over the counter   SALONPAS ARTHRITIS PAIN RELIEF EX Apply 1 patch topically daily as needed (pain).   traZODone 100 MG tablet Commonly known as: DESYREL TAKE 1 TABLET BY MOUTH AT  BEDTIME   venlafaxine XR 150 MG 24 hr capsule Commonly known as: EFFEXOR-XR TAKE 1 CAPSULE BY MOUTH  DAILY WITH BREAKFAST What changed:   how much to take  how to take this  when to take this   WOMENS 50+ MULTI VITAMIN/MIN PO Take 1 tablet by mouth daily.        Follow-up Information    [name], MD. Call.   Specialty: Psychiatry Why: Call to arrange follow up in 6-8 weeks Contact information: [place] [address] [phone]        [name], MD. Call.   Specialty: Internal Medicine Why: Call to arrange post-hospitalization follow up appointment with your primary care physician Contact information: [place] [address] [phone]        Ralene Ok, MD. Go on 04/28/2019.   Specialty: General Surgery Why: Your appointment is 04/28/2019 at 4:20pm Please arrive 30 minutes prior to your appointment to check in and fill out paperwork. Bring photo ID and insurance information. Contact information: [address] [place]  [phone]           Signed: Wellington Hampshire, PA-C [facility] 04/14/2019, 10:14 AM Please see Amion for pager number during day hours 7:00am-4:30pm

## 2019-04-14 NOTE — Consult Note (Signed)
   [facility] CM Inpatient Consult   04/14/2019  BERKLI GARNTO 20-Jan-1961 [id number]   [facility] Follow up:  Unsuccessful attempt to speak with patient by telephone this AM. Prior to hospital admission, patient had been active with a [facility]) Care Management [place] for education regarding chronic disease management of COPD.  Referral to [place] RN case manager for post hospital follow up and assessment of any community needs. Per chart review, Ms. Leason scheduled with [facility] services through [facility].  Of note, [facility] Care Management services does not replace or interfere with any services that are arranged by inpatient case management or social work.  Netta Cedars, MSN, [place] Hospital Liaison Nurse Mobile Phone [phone]  Toll free office [phone]

## 2019-04-14 NOTE — Discharge Instructions (Signed)
[facility] ______[facility] °LAPAROSCOPIC SURGERY: POST OP INSTRUCTIONS °Always review your discharge instruction sheet given to you by the facility where your surgery was performed. °IF YOU HAVE DISABILITY OR FAMILY LEAVE FORMS, YOU MUST BRING THEM TO THE OFFICE FOR PROCESSING.   °DO NOT GIVE THEM TO YOUR DOCTOR. ° °1. A prescription for pain medication may be given to you upon discharge.  Take your pain medication as prescribed, if needed.  If narcotic pain medicine is not needed, then you may take acetaminophen (Tylenol) or ibuprofen (Advil) as needed. °2. Take your usually prescribed medications unless otherwise directed. °3. If you need a refill on your pain medication, please contact your pharmacy.  They will contact our office to request authorization. Prescriptions will not be filled after 5pm or on week-ends. °4. You should follow a light diet the first few days after arrival home, such as soup and crackers, etc.  Be sure to include lots of fluids daily. °5. Most patients will experience some swelling and bruising in the area of the incisions.  Ice packs will help.  Swelling and bruising can take several days to resolve.  °6. It is common to experience some constipation if taking pain medication after surgery.  Increasing fluid intake and taking a stool softener (such as Colace) will usually help or prevent this problem from occurring.  A mild laxative (Milk of Magnesia or Miralax) should be taken according to package instructions if there are no bowel movements after 48 hours. °7. Unless discharge instructions indicate otherwise, you may remove your bandages 24-48 hours after surgery, and you may shower at that time.  You may have steri-strips (small skin tapes) in place directly over the incision.  These strips should be left on the skin for 7-10 days.  If your surgeon used skin glue on the incision, you may shower in 24 hours.  The glue will flake off over the next 2-3 weeks.  Any sutures or  staples will be removed at the office during your follow-up visit. °8. ACTIVITIES:  You may resume regular (light) daily activities beginning the next day--such as daily self-care, walking, climbing stairs--gradually increasing activities as tolerated.  You may have sexual intercourse when it is comfortable.  Refrain from any heavy lifting or straining until approved by your doctor. °a. You may drive when you are no longer taking prescription pain medication, you can comfortably wear a seatbelt, and you can safely maneuver your car and apply brakes. °b. RETURN TO WORK:  __________________________________________________________ °9. You should see your doctor in the office for a follow-up appointment approximately 2-3 weeks after your surgery.  Make sure that you call for this appointment within a day or two after you arrive home to insure a convenient appointment time. °10. OTHER INSTRUCTIONS: __________________________________________________________________________________________________________________________ __________________________________________________________________________________________________________________________ °WHEN TO CALL YOUR DOCTOR: °1. Fever over 101.0 °2. Inability to urinate °3. Continued bleeding from incision. °4. Increased pain, redness, or drainage from the incision. °5. Increasing abdominal pain ° °The clinic staff is available to answer your questions during regular business hours.  Please don’t hesitate to call and ask to speak to one of the nurses for clinical concerns.  If you have a medical emergency, go to the nearest emergency room or call 911.  A surgeon from [facility] is always on call at the hospital. °[address], [address], [place], [place]  [address] ? [address] [address], [place], [place]   [address] °[phone] ? [phone] ? FAX [phone] °Web site:   [email or web address] °

## 2019-04-14 NOTE — Progress Notes (Signed)
PT denies need for 2 lpm to be running at bedside for prn usage at this time (states she uses 2 lpm at night with cpap and prn during day).. Current Sp02 98% on RA.

## 2019-04-14 NOTE — TOC Transition Note (Addendum)
Transition of Care [facility]) - CM/SW Discharge Note   Patient Details  Name: KYNADIE ARNOUX MRN: [id number] Date of Birth: 01/22/1961  Transition of Care [facility]) CM/SW Contact:  Lia Hopping, [place] Phone Number: 04/14/2019, 10:04 AM   Clinical Narrative:    CSW reached out to the [place] to verify PT/OT, nurse aide will see the patient. Due to the possibility of inclement weather tomorrow the services may not begin until Friday. The  agency will contact the patient brother [name].  CSW provided and update to patient brother [name].   Final next level of care: [place] Barriers to Discharge: Continued Medical Work up   Patient Goals and CMS Choice Patient states their goals for this hospitalization and ongoing recovery are:: "I can do therapy at home, I have a sheet of excerises that I do at home from the last agency." CMS Medicare.gov Compare Post Acute Care list provided to:: Patient Choice offered to / list presented to : Patient  Discharge Placement                       Discharge Plan and Services   Discharge Planning Services: CM Consult Post Acute Care Choice: Home Health                    HH Arranged: PT, OT, Nurse's Aide [place] Agency: [facility] Date [place]: 04/14/19 Time [place]: 1004 Representative spoke with at [place]: [place] ([place]) Interventions     Readmission Risk Interventions Readmission Risk Prevention Plan 04/07/2019  Transportation Screening Complete  PCP or Specialist Appt within 3-5 Days Complete  HRI or [place] Complete  Social Work Consult for [place] Planning/Counseling Complete  Palliative Care Screening Not Applicable  Medication Review [employer]) Complete  Some recent data might be hidden

## 2019-04-15 ENCOUNTER — Ambulatory Visit (INDEPENDENT_AMBULATORY_CARE_PROVIDER_SITE_OTHER): Payer: Medicare HMO | Admitting: Licensed Clinical Social Worker

## 2019-04-15 ENCOUNTER — Other Ambulatory Visit: Payer: Self-pay | Admitting: *Deleted

## 2019-04-15 DIAGNOSIS — F0630 Mood disorder due to known physiological condition, unspecified: Secondary | ICD-10-CM | POA: Diagnosis not present

## 2019-04-15 DIAGNOSIS — F411 Generalized anxiety disorder: Secondary | ICD-10-CM | POA: Diagnosis not present

## 2019-04-15 DIAGNOSIS — R69 Illness, unspecified: Secondary | ICD-10-CM | POA: Diagnosis not present

## 2019-04-15 NOTE — Progress Notes (Signed)
Virtual Visit via Telephone Note  I connected with Joan [name] on 04/15/19 at  1:00 PM EST by telephone and verified that I am speaking with the correct person using two identifiers.   I discussed the limitations, risks, security and privacy concerns of performing an evaluation and management service by telephone and the availability of in person appointments. I also discussed with the patient that there may be a patient responsible charge related to this service. The patient expressed understanding and agreed to proceed.   I discussed the assessment and treatment plan with the patient. The patient was provided an opportunity to ask questions and all were answered. The patient agreed with the plan and demonstrated an understanding of the instructions.   The patient was advised to call back or seek an in-person evaluation if the symptoms worsen or if the condition fails to improve as anticipated.  I provided 40 minutes of non-face-to-face time during this encounter.  THERAPIST PROGRESS NOTE  Session Time: 1:15 PM to 1:55 PM  Participation Level: Active  Behavioral Response: CasualAlertappropriate  Type of Therapy: Individual Therapy  Treatment Goals addressed:  PTSD, coping, hallucinations  Interventions: Solution Focused, Strength-based, Supportive and Other: coping  Summary: Joan [name] is a 59 y.o. female who presents with still don't feel good, has cramps in stomach all over, "hanging in there", tired, got home yesterday from the hospital. Walks ten steps up and down the hallway daily. Strength wise and pain wise it is a push. Pain medications on Oxycodone not morphine and that is good every six hours Naproxen in between. Avoid meds cause constipation. Stopped eating for while nothing tasted good. Was not going to bathroom backing up, bowels and intestine going to sleep. Always deyhdrated. Couldn't move or get out of bed. That's when went to hospital. Whole stomach infected. Appendix  was rotted. So bad almost died. Touch and go for the first week. Could only take out so much of the appendix. Better to wait better survival rate. See surgeon March 3. Right now doing good all she knows. Gets very tried reclining a lot and have to push to walk. Has more to go for her treatment, things that arehigher risk, appendix at end of colon and only hope to remove a little of colon, giving it time to settle. Just want it out, afraid go through it again. Still rotted and gangrene in appendix. Still dealing with hip replacement surgery, wear brace for 5 weeks but can't wear it. Everything is way it should be with hip just precautions will have to do that all life. Focus on appendix as priority. Not depressed, bored things and doesn't feel good enough to do something, sometimes out of it. Mind confused and doesn't know what to do. Confused then frustrated easily because of it. 20 minutes only can sleep at a time. Patient shares putting things beside the bed difficult not point can function well as far as things to do to keep busy. "As long as not dying things coming in and coming out (of her body)."  Patient verbalizing a wider perspective on things.  Has worries about balancing her account but can't. Put it away but still think about things. Wants to be doing ok with the bank account. Shares that stress not going to do her good and don't feel good. Mindset feels she is ok with bank, usually when figure things out in her head and check and she is right.  Discussed putting things in perspective a priority  right now on what she has to focus on is healing and that is what she needs to do right now.  Suicidal/Homicidal: No  Therapist Response: Therapist reviewed symptoms, discussed stressors, facilitated expression of thoughts and feelings assesses particularly helpful as patient is dealing with challenges of recovery from high risk surgery.  Encourage patient with coping strategies that will help with her healing  process such as not focusing on worries that additional stress does not help and takes away from her healing, recognize her focus and task right now, her priority is to relax in order to heal.  Also encourage patient not to go into negative thoughts related to medical issues, recognizing or not the experts and sometimes creates stories with our imagination and lack of expertise can create very inaccurate and unhelpful stories.  Reviewed managing frustrations discussed putting something beside her bed that she could do when she feels okay.  Discussed helpful mindset that helps her destress such as prioritizing her health, telling herself if she feels like it she will get to some of her responsibilities K if she doesn't as she still has early stages of healing.  Therapist provided strength based and supportive intervention.  Plan: Return again in 2 weeks.2.Therapist work with patient on mood regulation, stress management, management of symptoms, explore and help work on grief and PTSD symptoms as needed.3.supportive interventions as she recuperates from surgery  Diagnosis: Axis I:   PTSD, hallucinations, GAD, Mood Disorder in conditions classified elsewhere    Axis II: No diagnosis    [name], LCSW 04/15/2019

## 2019-04-15 NOTE — Patient Outreach (Signed)
[place] [facility]) Care Management  04/15/2019  Joan [name] January 18, 1961 [id number]   Referral received from hospital liaison as member was recently discharged after being admitted with acute appendicitis requiring appendectomy.  Primary MD office will complete transition of care assessment.  Per chart, she also has history of HTN, CAD, COPD, GERD, Hypothyroidism, dyslipidemia, and anxiety disorder.  She was active with disease management prior to admission.    Call placed to member to follow up on discharge, no answer, phone rang busy.  Attempted call 3 times with same result.  Will send unsuccessful outreach letter and follow up within the next 3-4 business days.  Valente David, [place], MSN [place] [phone]

## 2019-04-16 ENCOUNTER — Telehealth: Payer: Self-pay

## 2019-04-16 NOTE — Telephone Encounter (Signed)
F/u   Patient returning call back to nurse.

## 2019-04-16 NOTE — Telephone Encounter (Signed)
Pt is on TCM list after acute appendicitis. Pt was admitted on 03/30/2019 and dc'ed on 04/14/2019.   LVM for pt to call back as soon as possible.

## 2019-04-19 DIAGNOSIS — G8929 Other chronic pain: Secondary | ICD-10-CM | POA: Diagnosis not present

## 2019-04-19 DIAGNOSIS — J9611 Chronic respiratory failure with hypoxia: Secondary | ICD-10-CM | POA: Diagnosis not present

## 2019-04-19 DIAGNOSIS — I2510 Atherosclerotic heart disease of native coronary artery without angina pectoris: Secondary | ICD-10-CM | POA: Diagnosis not present

## 2019-04-19 DIAGNOSIS — J439 Emphysema, unspecified: Secondary | ICD-10-CM | POA: Diagnosis not present

## 2019-04-19 DIAGNOSIS — M150 Primary generalized (osteo)arthritis: Secondary | ICD-10-CM | POA: Diagnosis not present

## 2019-04-19 DIAGNOSIS — Z48815 Encounter for surgical aftercare following surgery on the digestive system: Secondary | ICD-10-CM | POA: Diagnosis not present

## 2019-04-19 DIAGNOSIS — M545 Low back pain: Secondary | ICD-10-CM | POA: Diagnosis not present

## 2019-04-19 DIAGNOSIS — E119 Type 2 diabetes mellitus without complications: Secondary | ICD-10-CM | POA: Diagnosis not present

## 2019-04-19 DIAGNOSIS — D509 Iron deficiency anemia, unspecified: Secondary | ICD-10-CM | POA: Diagnosis not present

## 2019-04-19 DIAGNOSIS — I10 Essential (primary) hypertension: Secondary | ICD-10-CM | POA: Diagnosis not present

## 2019-04-19 NOTE — Telephone Encounter (Signed)
° ° °  Please return call to patient °

## 2019-04-20 ENCOUNTER — Other Ambulatory Visit: Payer: Self-pay | Admitting: *Deleted

## 2019-04-20 NOTE — Telephone Encounter (Signed)
Transition Care Management Follow-up Telephone Call   Date discharged? 04/14/2019  How have you been since you were released from the hospital? Sore but better.   Do you understand why you were in the hospital? Yes  Do you understand the discharge instructions? Yes  Where were you discharged to? Home Items Reviewed:  Medications reviewed: Yes  Allergies reviewed: Yes  Dietary changes reviewed: Yes  Referrals reviewed: Yes, HH, SW, OT and PT Functional Questionnaire:   Activities of Daily Living (ADLs):   States they are independent in the following: most - dressing, toilet ing.  States they require assistance with the following: patient is currently getting meal on wheels.   Any transportation issues/concerns?: No  Any patient concerns? No  Confirmed importance and date/time of follow-up visits scheduled: Yes  Provider Appointment booked with PCP  Confirmed with patient if condition begins to worsen call PCP or go to the ER: Yes Patient was given the office number and encouraged to call back with question or concerns: Yes

## 2019-04-20 NOTE — Patient Outreach (Signed)
[facility] [facility]) Care Management  04/20/2019  AZORA PARYS Apr 01, 1960 [id number]   Outreach attempt #2, unsuccessful.   Referral received from hospital liaison as member was recently discharged after being admitted with acute appendicitis requiring appendectomy.  Primary MD office will complete transition of care assessment.  Per chart, she also has history of HTN, CAD, COPD, GERD, Hypothyroidism, dyslipidemia, and anxiety disorder.  She was active with disease management prior to admission.  Call placed to member, phone rings busy multiple times, unable to leave message.  Call then placed to daughter Gwenlyn Found, she request her uncle [name] be called, name/number listed in chart.  She request call back if unable to reach him.  Call place to member's brother, no answer, HIPAA compliant voice message left.  Call then placed back to daughter, no answer, HIPAA compliant voice message left.  Will attempt 3rd attempt to follow up with member within the next 3-4 business days.  Valente David, [place], MSN [place] [phone]

## 2019-04-22 ENCOUNTER — Telehealth: Payer: Self-pay

## 2019-04-22 DIAGNOSIS — M150 Primary generalized (osteo)arthritis: Secondary | ICD-10-CM | POA: Diagnosis not present

## 2019-04-22 DIAGNOSIS — I2510 Atherosclerotic heart disease of native coronary artery without angina pectoris: Secondary | ICD-10-CM | POA: Diagnosis not present

## 2019-04-22 DIAGNOSIS — G8929 Other chronic pain: Secondary | ICD-10-CM | POA: Diagnosis not present

## 2019-04-22 DIAGNOSIS — I10 Essential (primary) hypertension: Secondary | ICD-10-CM | POA: Diagnosis not present

## 2019-04-22 DIAGNOSIS — D509 Iron deficiency anemia, unspecified: Secondary | ICD-10-CM | POA: Diagnosis not present

## 2019-04-22 DIAGNOSIS — E119 Type 2 diabetes mellitus without complications: Secondary | ICD-10-CM | POA: Diagnosis not present

## 2019-04-22 DIAGNOSIS — J9611 Chronic respiratory failure with hypoxia: Secondary | ICD-10-CM | POA: Diagnosis not present

## 2019-04-22 DIAGNOSIS — J439 Emphysema, unspecified: Secondary | ICD-10-CM | POA: Diagnosis not present

## 2019-04-22 DIAGNOSIS — M545 Low back pain: Secondary | ICD-10-CM | POA: Diagnosis not present

## 2019-04-22 DIAGNOSIS — Z48815 Encounter for surgical aftercare following surgery on the digestive system: Secondary | ICD-10-CM | POA: Diagnosis not present

## 2019-04-22 NOTE — Telephone Encounter (Signed)
Verbal orders given  

## 2019-04-22 NOTE — Telephone Encounter (Signed)
Fine

## 2019-04-22 NOTE — Telephone Encounter (Signed)
Home Health Verbal Orders - Caller/Agency: [facility] Number: [phone] (can leave a voicemail) Requesting OT/PT/Skilled Nursing/Social Work/Speech Therapy: Physical Therapy, Skilled Nursing, and Social Work Frequency:  PT: 2x a week for 2 weeks 1x a week for 2 weeks Starting next week.  Would also like order for [place] for poor appetite and a Social Work Magazine features editor for help for a Insurance underwriter.

## 2019-04-23 ENCOUNTER — Other Ambulatory Visit: Payer: Self-pay | Admitting: *Deleted

## 2019-04-23 NOTE — Patient Outreach (Signed)
[facility]) Care Management  04/23/2019  Joan [name] Jan 25, 1961 [id number]   Outreach attempt #3, unsuccessful.   Referral received from hospital liaison as member was recently discharged after being admitted with acute appendicitis requiring appendectomy. Primary MD office will complete transition of care assessment. Per chart, she also has history of HTN, CAD, COPD, GERD, Hypothyroidism, dyslipidemia, and anxiety disorder. She was active with disease management prior to admission. Call placed to member, phone rings busy multiple times, unable to leave message.  If no call back by 3/4 will close case due to inability to establish contact.  Valente David, [place], MSN [place] [phone]

## 2019-04-25 DIAGNOSIS — G4733 Obstructive sleep apnea (adult) (pediatric): Secondary | ICD-10-CM | POA: Diagnosis not present

## 2019-04-25 DIAGNOSIS — J9611 Chronic respiratory failure with hypoxia: Secondary | ICD-10-CM | POA: Diagnosis not present

## 2019-04-25 DIAGNOSIS — J441 Chronic obstructive pulmonary disease with (acute) exacerbation: Secondary | ICD-10-CM | POA: Diagnosis not present

## 2019-04-25 DIAGNOSIS — J449 Chronic obstructive pulmonary disease, unspecified: Secondary | ICD-10-CM | POA: Diagnosis not present

## 2019-04-27 DIAGNOSIS — F4310 Post-traumatic stress disorder, unspecified: Secondary | ICD-10-CM

## 2019-04-27 DIAGNOSIS — Z48815 Encounter for surgical aftercare following surgery on the digestive system: Secondary | ICD-10-CM | POA: Diagnosis not present

## 2019-04-27 DIAGNOSIS — G4733 Obstructive sleep apnea (adult) (pediatric): Secondary | ICD-10-CM

## 2019-04-27 DIAGNOSIS — Z96643 Presence of artificial hip joint, bilateral: Secondary | ICD-10-CM

## 2019-04-27 DIAGNOSIS — Z87891 Personal history of nicotine dependence: Secondary | ICD-10-CM

## 2019-04-27 DIAGNOSIS — F411 Generalized anxiety disorder: Secondary | ICD-10-CM

## 2019-04-27 DIAGNOSIS — J439 Emphysema, unspecified: Secondary | ICD-10-CM

## 2019-04-27 DIAGNOSIS — E119 Type 2 diabetes mellitus without complications: Secondary | ICD-10-CM | POA: Diagnosis not present

## 2019-04-27 DIAGNOSIS — K5909 Other constipation: Secondary | ICD-10-CM

## 2019-04-27 DIAGNOSIS — B1920 Unspecified viral hepatitis C without hepatic coma: Secondary | ICD-10-CM

## 2019-04-27 DIAGNOSIS — G8929 Other chronic pain: Secondary | ICD-10-CM

## 2019-04-27 DIAGNOSIS — I2510 Atherosclerotic heart disease of native coronary artery without angina pectoris: Secondary | ICD-10-CM

## 2019-04-27 DIAGNOSIS — M545 Low back pain: Secondary | ICD-10-CM

## 2019-04-27 DIAGNOSIS — J9611 Chronic respiratory failure with hypoxia: Secondary | ICD-10-CM

## 2019-04-27 DIAGNOSIS — M150 Primary generalized (osteo)arthritis: Secondary | ICD-10-CM

## 2019-04-27 DIAGNOSIS — I10 Essential (primary) hypertension: Secondary | ICD-10-CM

## 2019-04-27 DIAGNOSIS — E039 Hypothyroidism, unspecified: Secondary | ICD-10-CM | POA: Diagnosis not present

## 2019-04-27 DIAGNOSIS — Z7951 Long term (current) use of inhaled steroids: Secondary | ICD-10-CM

## 2019-04-27 DIAGNOSIS — F319 Bipolar disorder, unspecified: Secondary | ICD-10-CM

## 2019-04-27 DIAGNOSIS — Z8701 Personal history of pneumonia (recurrent): Secondary | ICD-10-CM

## 2019-04-27 DIAGNOSIS — R911 Solitary pulmonary nodule: Secondary | ICD-10-CM

## 2019-04-27 DIAGNOSIS — Z9181 History of falling: Secondary | ICD-10-CM

## 2019-04-27 DIAGNOSIS — D509 Iron deficiency anemia, unspecified: Secondary | ICD-10-CM | POA: Diagnosis not present

## 2019-04-27 DIAGNOSIS — E785 Hyperlipidemia, unspecified: Secondary | ICD-10-CM

## 2019-04-27 DIAGNOSIS — G4700 Insomnia, unspecified: Secondary | ICD-10-CM

## 2019-04-28 DIAGNOSIS — Z96642 Presence of left artificial hip joint: Secondary | ICD-10-CM | POA: Diagnosis not present

## 2019-04-28 DIAGNOSIS — Z471 Aftercare following joint replacement surgery: Secondary | ICD-10-CM | POA: Diagnosis not present

## 2019-04-29 ENCOUNTER — Other Ambulatory Visit: Payer: Self-pay | Admitting: *Deleted

## 2019-04-29 ENCOUNTER — Ambulatory Visit (INDEPENDENT_AMBULATORY_CARE_PROVIDER_SITE_OTHER): Payer: Medicare Other | Admitting: Internal Medicine

## 2019-04-29 ENCOUNTER — Other Ambulatory Visit: Payer: Self-pay

## 2019-04-29 ENCOUNTER — Encounter: Payer: Self-pay | Admitting: Internal Medicine

## 2019-04-29 VITALS — BP 136/84 | HR 66 | Temp 98.2°F | Ht 66.0 in | Wt 192.8 lb

## 2019-04-29 DIAGNOSIS — J449 Chronic obstructive pulmonary disease, unspecified: Secondary | ICD-10-CM

## 2019-04-29 DIAGNOSIS — I10 Essential (primary) hypertension: Secondary | ICD-10-CM

## 2019-04-29 DIAGNOSIS — K5909 Other constipation: Secondary | ICD-10-CM

## 2019-04-29 LAB — COMPREHENSIVE METABOLIC PANEL
ALT: 25 U/L (ref 0–35)
AST: 18 U/L (ref 0–37)
Albumin: 4.1 g/dL (ref 3.5–5.2)
Alkaline Phosphatase: 141 U/L — ABNORMAL HIGH (ref 39–117)
BUN: 15 mg/dL (ref 6–23)
CO2: 33 mEq/L — ABNORMAL HIGH (ref 19–32)
Calcium: 9.9 mg/dL (ref 8.4–10.5)
Chloride: 93 mEq/L — ABNORMAL LOW (ref 96–112)
Creatinine, Ser: 1.04 mg/dL (ref 0.40–1.20)
GFR: 54.52 mL/min — ABNORMAL LOW (ref >60.00)
Glucose, Bld: 104 mg/dL — ABNORMAL HIGH (ref 70–99)
Potassium: 3.7 mEq/L (ref 3.5–5.1)
Sodium: 135 mEq/L (ref 135–145)
Total Bilirubin: 0.5 mg/dL (ref 0.2–1.2)
Total Protein: 7.3 g/dL (ref 6.0–8.3)

## 2019-04-29 LAB — CBC
HCT: 37.4 % (ref 36.0–46.0)
Hemoglobin: 12.4 g/dL (ref 12.0–15.0)
MCHC: 33.3 g/dL (ref 30.0–36.0)
MCV: 90.6 fl (ref 78.0–100.0)
Platelets: 314 10*3/uL (ref 150.0–400.0)
RBC: 4.13 Mil/uL (ref 3.87–5.11)
RDW: 14.8 % (ref 11.5–15.5)
WBC: 9.6 10*3/uL (ref 4.0–10.5)

## 2019-04-29 NOTE — Patient Outreach (Signed)
[facility]) Care Management  04/29/2019  Joan [name] [birth year] [id number]   No response from member after multiple unsuccessful outreach attempts and letter sent.  Will close case at this time due to inability to maintain contact.  Will notify member and primary MD of case closure.  Valente David, [place], MSN [place] [phone]

## 2019-04-29 NOTE — Progress Notes (Signed)
   Subjective:   Patient ID: Joan [name], female    DOB: [birth year], 59 y.o.   MRN: [id number]  HPI The patient is a 59 YO female coming in for hospital follow up (in for pain, appendicitis, underwent surgery however they were unable to remove all appendix, still having some discomfort and poor BMs, prolonged ileus post operatively). She is doing okay, still with poor appetite and stamina. Denies fevers or chills. Denies change in SOB. Is still smoke-free. Denies new problems. No blood in stool.   PMH, [facility], social history reviewed and updated  Review of Systems  Constitutional: Negative.   HENT: Negative.   Eyes: Negative.   Respiratory: Positive for shortness of breath. Negative for cough and chest tightness.   Cardiovascular: Negative for chest pain, palpitations and leg swelling.  Gastrointestinal: Positive for abdominal pain and constipation. Negative for abdominal distention, diarrhea, nausea and vomiting.  Musculoskeletal: Negative.   Skin: Negative.   Neurological: Negative.   Psychiatric/Behavioral: Negative.     Objective:  Physical Exam Constitutional:      Appearance: She is well-developed.  HENT:     Head: Normocephalic and atraumatic.  Cardiovascular:     Rate and Rhythm: Normal rate and regular rhythm.  Pulmonary:     Effort: Pulmonary effort is normal. No respiratory distress.     Breath sounds: Wheezing present. No rales.     Comments: Stable lung exam, no dyspnea Abdominal:     General: Bowel sounds are normal. There is no distension.     Palpations: Abdomen is soft.     Tenderness: There is no abdominal tenderness. There is no rebound.  Musculoskeletal:     Cervical back: Normal range of motion.  Skin:    General: Skin is warm and dry.  Neurological:     Mental Status: She is alert and oriented to person, place, and time.     Coordination: Coordination abnormal.     Comments: Wheelchair for long distances, walker at home     Vitals:   04/29/19 1527    BP: 136/84  Pulse: 66  Temp: 98.2 F (36.8 C)  TempSrc: Oral  SpO2: 94%  Weight: 192 lb 12.8 oz (87.5 kg)  Height: 5\' 6"  (1.676 m)    This visit occurred during the SARS-CoV-2 public health emergency.  Safety protocols were in place, including screening questions prior to the visit, additional usage of staff PPE, and extensive cleaning of exam room while observing appropriate contact time as indicated for disinfecting solutions.   Assessment & Plan:

## 2019-04-29 NOTE — Patient Instructions (Signed)
We are rechecking the labs today and will call you back about the results.   Keep working with therapy.   Keep up the good work with physical therapy.

## 2019-04-30 ENCOUNTER — Encounter: Payer: Self-pay | Admitting: Internal Medicine

## 2019-04-30 DIAGNOSIS — G4733 Obstructive sleep apnea (adult) (pediatric): Secondary | ICD-10-CM | POA: Diagnosis not present

## 2019-04-30 DIAGNOSIS — D509 Iron deficiency anemia, unspecified: Secondary | ICD-10-CM | POA: Diagnosis not present

## 2019-04-30 DIAGNOSIS — Z87891 Personal history of nicotine dependence: Secondary | ICD-10-CM | POA: Diagnosis not present

## 2019-04-30 DIAGNOSIS — E039 Hypothyroidism, unspecified: Secondary | ICD-10-CM | POA: Diagnosis not present

## 2019-04-30 DIAGNOSIS — J9611 Chronic respiratory failure with hypoxia: Secondary | ICD-10-CM | POA: Diagnosis not present

## 2019-04-30 DIAGNOSIS — G4700 Insomnia, unspecified: Secondary | ICD-10-CM | POA: Diagnosis not present

## 2019-04-30 DIAGNOSIS — E785 Hyperlipidemia, unspecified: Secondary | ICD-10-CM | POA: Diagnosis not present

## 2019-04-30 DIAGNOSIS — G8929 Other chronic pain: Secondary | ICD-10-CM | POA: Diagnosis not present

## 2019-04-30 DIAGNOSIS — I2510 Atherosclerotic heart disease of native coronary artery without angina pectoris: Secondary | ICD-10-CM | POA: Diagnosis not present

## 2019-04-30 DIAGNOSIS — E119 Type 2 diabetes mellitus without complications: Secondary | ICD-10-CM | POA: Diagnosis not present

## 2019-04-30 DIAGNOSIS — K5909 Other constipation: Secondary | ICD-10-CM | POA: Diagnosis not present

## 2019-04-30 DIAGNOSIS — Z7951 Long term (current) use of inhaled steroids: Secondary | ICD-10-CM | POA: Diagnosis not present

## 2019-04-30 DIAGNOSIS — R911 Solitary pulmonary nodule: Secondary | ICD-10-CM | POA: Diagnosis not present

## 2019-04-30 DIAGNOSIS — Z9181 History of falling: Secondary | ICD-10-CM | POA: Diagnosis not present

## 2019-04-30 DIAGNOSIS — I10 Essential (primary) hypertension: Secondary | ICD-10-CM | POA: Diagnosis not present

## 2019-04-30 DIAGNOSIS — M545 Low back pain: Secondary | ICD-10-CM | POA: Diagnosis not present

## 2019-04-30 DIAGNOSIS — J439 Emphysema, unspecified: Secondary | ICD-10-CM | POA: Diagnosis not present

## 2019-04-30 DIAGNOSIS — Z96643 Presence of artificial hip joint, bilateral: Secondary | ICD-10-CM | POA: Diagnosis not present

## 2019-04-30 DIAGNOSIS — Z48815 Encounter for surgical aftercare following surgery on the digestive system: Secondary | ICD-10-CM | POA: Diagnosis not present

## 2019-04-30 DIAGNOSIS — M150 Primary generalized (osteo)arthritis: Secondary | ICD-10-CM | POA: Diagnosis not present

## 2019-04-30 NOTE — Assessment & Plan Note (Signed)
No flare today, encouraged lifelong smoke free to help stabilize lung function.

## 2019-04-30 NOTE — Assessment & Plan Note (Signed)
Worse post-operatively. She will still follow up with surgeon as there is still a possibility she needs further surgery for appendix stump.

## 2019-05-03 DIAGNOSIS — E119 Type 2 diabetes mellitus without complications: Secondary | ICD-10-CM | POA: Diagnosis not present

## 2019-05-03 DIAGNOSIS — Z7951 Long term (current) use of inhaled steroids: Secondary | ICD-10-CM | POA: Diagnosis not present

## 2019-05-03 DIAGNOSIS — M150 Primary generalized (osteo)arthritis: Secondary | ICD-10-CM | POA: Diagnosis not present

## 2019-05-03 DIAGNOSIS — R911 Solitary pulmonary nodule: Secondary | ICD-10-CM | POA: Diagnosis not present

## 2019-05-03 DIAGNOSIS — J439 Emphysema, unspecified: Secondary | ICD-10-CM | POA: Diagnosis not present

## 2019-05-03 DIAGNOSIS — Z9181 History of falling: Secondary | ICD-10-CM | POA: Diagnosis not present

## 2019-05-03 DIAGNOSIS — E785 Hyperlipidemia, unspecified: Secondary | ICD-10-CM | POA: Diagnosis not present

## 2019-05-03 DIAGNOSIS — G8929 Other chronic pain: Secondary | ICD-10-CM | POA: Diagnosis not present

## 2019-05-03 DIAGNOSIS — Z48815 Encounter for surgical aftercare following surgery on the digestive system: Secondary | ICD-10-CM | POA: Diagnosis not present

## 2019-05-03 DIAGNOSIS — D509 Iron deficiency anemia, unspecified: Secondary | ICD-10-CM | POA: Diagnosis not present

## 2019-05-03 DIAGNOSIS — G4700 Insomnia, unspecified: Secondary | ICD-10-CM | POA: Diagnosis not present

## 2019-05-03 DIAGNOSIS — E039 Hypothyroidism, unspecified: Secondary | ICD-10-CM | POA: Diagnosis not present

## 2019-05-03 DIAGNOSIS — J9611 Chronic respiratory failure with hypoxia: Secondary | ICD-10-CM | POA: Diagnosis not present

## 2019-05-03 DIAGNOSIS — Z87891 Personal history of nicotine dependence: Secondary | ICD-10-CM | POA: Diagnosis not present

## 2019-05-03 DIAGNOSIS — M545 Low back pain: Secondary | ICD-10-CM | POA: Diagnosis not present

## 2019-05-03 DIAGNOSIS — K5909 Other constipation: Secondary | ICD-10-CM | POA: Diagnosis not present

## 2019-05-03 DIAGNOSIS — Z96643 Presence of artificial hip joint, bilateral: Secondary | ICD-10-CM | POA: Diagnosis not present

## 2019-05-03 DIAGNOSIS — G4733 Obstructive sleep apnea (adult) (pediatric): Secondary | ICD-10-CM | POA: Diagnosis not present

## 2019-05-03 DIAGNOSIS — I10 Essential (primary) hypertension: Secondary | ICD-10-CM | POA: Diagnosis not present

## 2019-05-03 DIAGNOSIS — I2510 Atherosclerotic heart disease of native coronary artery without angina pectoris: Secondary | ICD-10-CM | POA: Diagnosis not present

## 2019-05-04 DIAGNOSIS — Z9181 History of falling: Secondary | ICD-10-CM | POA: Diagnosis not present

## 2019-05-04 DIAGNOSIS — J439 Emphysema, unspecified: Secondary | ICD-10-CM | POA: Diagnosis not present

## 2019-05-04 DIAGNOSIS — K5909 Other constipation: Secondary | ICD-10-CM | POA: Diagnosis not present

## 2019-05-04 DIAGNOSIS — E039 Hypothyroidism, unspecified: Secondary | ICD-10-CM | POA: Diagnosis not present

## 2019-05-04 DIAGNOSIS — M545 Low back pain: Secondary | ICD-10-CM | POA: Diagnosis not present

## 2019-05-04 DIAGNOSIS — M150 Primary generalized (osteo)arthritis: Secondary | ICD-10-CM | POA: Diagnosis not present

## 2019-05-04 DIAGNOSIS — I2510 Atherosclerotic heart disease of native coronary artery without angina pectoris: Secondary | ICD-10-CM | POA: Diagnosis not present

## 2019-05-04 DIAGNOSIS — I10 Essential (primary) hypertension: Secondary | ICD-10-CM | POA: Diagnosis not present

## 2019-05-04 DIAGNOSIS — Z87891 Personal history of nicotine dependence: Secondary | ICD-10-CM | POA: Diagnosis not present

## 2019-05-04 DIAGNOSIS — Z7951 Long term (current) use of inhaled steroids: Secondary | ICD-10-CM | POA: Diagnosis not present

## 2019-05-04 DIAGNOSIS — G8929 Other chronic pain: Secondary | ICD-10-CM | POA: Diagnosis not present

## 2019-05-04 DIAGNOSIS — Z48815 Encounter for surgical aftercare following surgery on the digestive system: Secondary | ICD-10-CM | POA: Diagnosis not present

## 2019-05-04 DIAGNOSIS — D509 Iron deficiency anemia, unspecified: Secondary | ICD-10-CM | POA: Diagnosis not present

## 2019-05-04 DIAGNOSIS — G4700 Insomnia, unspecified: Secondary | ICD-10-CM | POA: Diagnosis not present

## 2019-05-04 DIAGNOSIS — G4733 Obstructive sleep apnea (adult) (pediatric): Secondary | ICD-10-CM | POA: Diagnosis not present

## 2019-05-04 DIAGNOSIS — J9611 Chronic respiratory failure with hypoxia: Secondary | ICD-10-CM | POA: Diagnosis not present

## 2019-05-04 DIAGNOSIS — E119 Type 2 diabetes mellitus without complications: Secondary | ICD-10-CM | POA: Diagnosis not present

## 2019-05-04 DIAGNOSIS — R911 Solitary pulmonary nodule: Secondary | ICD-10-CM | POA: Diagnosis not present

## 2019-05-04 DIAGNOSIS — Z96643 Presence of artificial hip joint, bilateral: Secondary | ICD-10-CM | POA: Diagnosis not present

## 2019-05-04 DIAGNOSIS — E785 Hyperlipidemia, unspecified: Secondary | ICD-10-CM | POA: Diagnosis not present

## 2019-05-05 ENCOUNTER — Emergency Department (HOSPITAL_COMMUNITY)
Admission: EM | Admit: 2019-05-05 | Discharge: 2019-05-05 | Disposition: A | Discharge status: Home / Self Care | Payer: Medicare Other | Attending: Emergency Medicine | Admitting: Emergency Medicine

## 2019-05-05 ENCOUNTER — Encounter (HOSPITAL_COMMUNITY): Payer: Self-pay | Admitting: Emergency Medicine

## 2019-05-05 ENCOUNTER — Other Ambulatory Visit: Payer: Self-pay

## 2019-05-05 ENCOUNTER — Emergency Department (HOSPITAL_COMMUNITY): Payer: Medicare Other

## 2019-05-05 DIAGNOSIS — G4700 Insomnia, unspecified: Secondary | ICD-10-CM | POA: Diagnosis not present

## 2019-05-05 DIAGNOSIS — R079 Chest pain, unspecified: Secondary | ICD-10-CM | POA: Diagnosis not present

## 2019-05-05 DIAGNOSIS — Z87891 Personal history of nicotine dependence: Secondary | ICD-10-CM | POA: Diagnosis not present

## 2019-05-05 DIAGNOSIS — I10 Essential (primary) hypertension: Secondary | ICD-10-CM | POA: Diagnosis not present

## 2019-05-05 DIAGNOSIS — Z9181 History of falling: Secondary | ICD-10-CM | POA: Diagnosis not present

## 2019-05-05 DIAGNOSIS — R0789 Other chest pain: Secondary | ICD-10-CM | POA: Insufficient documentation

## 2019-05-05 DIAGNOSIS — E038 Other specified hypothyroidism: Secondary | ICD-10-CM | POA: Insufficient documentation

## 2019-05-05 DIAGNOSIS — G8929 Other chronic pain: Secondary | ICD-10-CM | POA: Diagnosis not present

## 2019-05-05 DIAGNOSIS — R0602 Shortness of breath: Secondary | ICD-10-CM | POA: Diagnosis not present

## 2019-05-05 DIAGNOSIS — G4733 Obstructive sleep apnea (adult) (pediatric): Secondary | ICD-10-CM | POA: Diagnosis not present

## 2019-05-05 DIAGNOSIS — J441 Chronic obstructive pulmonary disease with (acute) exacerbation: Secondary | ICD-10-CM | POA: Insufficient documentation

## 2019-05-05 DIAGNOSIS — E119 Type 2 diabetes mellitus without complications: Secondary | ICD-10-CM | POA: Insufficient documentation

## 2019-05-05 DIAGNOSIS — Z79899 Other long term (current) drug therapy: Secondary | ICD-10-CM | POA: Diagnosis not present

## 2019-05-05 DIAGNOSIS — J439 Emphysema, unspecified: Secondary | ICD-10-CM | POA: Diagnosis not present

## 2019-05-05 DIAGNOSIS — Z96643 Presence of artificial hip joint, bilateral: Secondary | ICD-10-CM | POA: Diagnosis not present

## 2019-05-05 DIAGNOSIS — E039 Hypothyroidism, unspecified: Secondary | ICD-10-CM | POA: Diagnosis not present

## 2019-05-05 DIAGNOSIS — J9611 Chronic respiratory failure with hypoxia: Secondary | ICD-10-CM | POA: Diagnosis not present

## 2019-05-05 DIAGNOSIS — R410 Disorientation, unspecified: Secondary | ICD-10-CM | POA: Diagnosis not present

## 2019-05-05 DIAGNOSIS — Z48815 Encounter for surgical aftercare following surgery on the digestive system: Secondary | ICD-10-CM | POA: Diagnosis not present

## 2019-05-05 DIAGNOSIS — E785 Hyperlipidemia, unspecified: Secondary | ICD-10-CM | POA: Diagnosis not present

## 2019-05-05 DIAGNOSIS — Z743 Need for continuous supervision: Secondary | ICD-10-CM | POA: Diagnosis not present

## 2019-05-05 DIAGNOSIS — K5909 Other constipation: Secondary | ICD-10-CM | POA: Diagnosis not present

## 2019-05-05 DIAGNOSIS — R911 Solitary pulmonary nodule: Secondary | ICD-10-CM | POA: Diagnosis not present

## 2019-05-05 DIAGNOSIS — M545 Low back pain: Secondary | ICD-10-CM | POA: Diagnosis not present

## 2019-05-05 DIAGNOSIS — D509 Iron deficiency anemia, unspecified: Secondary | ICD-10-CM | POA: Diagnosis not present

## 2019-05-05 DIAGNOSIS — I2510 Atherosclerotic heart disease of native coronary artery without angina pectoris: Secondary | ICD-10-CM | POA: Diagnosis not present

## 2019-05-05 DIAGNOSIS — Z7951 Long term (current) use of inhaled steroids: Secondary | ICD-10-CM | POA: Diagnosis not present

## 2019-05-05 DIAGNOSIS — M150 Primary generalized (osteo)arthritis: Secondary | ICD-10-CM | POA: Diagnosis not present

## 2019-05-05 LAB — BASIC METABOLIC PANEL
Anion gap: 13 (ref 5–15)
BUN: 10 mg/dL (ref 6–20)
CO2: 28 mmol/L (ref 22–32)
Calcium: 9.3 mg/dL (ref 8.9–10.3)
Chloride: 97 mmol/L — ABNORMAL LOW (ref 98–111)
Creatinine, Ser: 1.02 mg/dL — ABNORMAL HIGH (ref 0.44–1.00)
GFR calc Af Amer: >60 mL/min (ref >60)
GFR calc non Af Amer: >60 mL/min (ref >60)
Glucose, Bld: 127 mg/dL — ABNORMAL HIGH (ref 70–99)
Potassium: 3.2 mmol/L — ABNORMAL LOW (ref 3.5–5.1)
Sodium: 138 mmol/L (ref 135–145)

## 2019-05-05 LAB — CBC
HCT: 39.1 % (ref 36.0–46.0)
Hemoglobin: 12.5 g/dL (ref 12.0–15.0)
MCH: 29.6 pg (ref 26.0–34.0)
MCHC: 32 g/dL (ref 30.0–36.0)
MCV: 92.4 fL (ref 80.0–100.0)
Platelets: 289 10*3/uL (ref 150–400)
RBC: 4.23 MIL/uL (ref 3.87–5.11)
RDW: 13.6 % (ref 11.5–15.5)
WBC: 10.9 10*3/uL — ABNORMAL HIGH (ref 4.0–10.5)
nRBC: 0 % (ref 0.0–0.2)

## 2019-05-05 LAB — TROPONIN I (HIGH SENSITIVITY): Troponin I (High Sensitivity): 3 ng/L (ref <18)

## 2019-05-05 MED ORDER — ALBUTEROL SULFATE HFA 108 (90 BASE) MCG/ACT IN AERS
2.0000 | INHALATION_SPRAY | Freq: Once | RESPIRATORY_TRACT | Status: AC
  Administered 2019-05-05: 17:00:00 2 via RESPIRATORY_TRACT
  Filled 2019-05-05: qty 6.7

## 2019-05-05 MED ORDER — ALUM & MAG HYDROXIDE-SIMETH 200-200-20 MG/5ML PO SUSP
30.0000 mL | Freq: Once | ORAL | Status: AC
  Administered 2019-05-05: 20:00:00 30 mL via ORAL
  Filled 2019-05-05: qty 30

## 2019-05-05 MED ORDER — METHYLPREDNISOLONE SODIUM SUCC 125 MG IJ SOLR
125.0000 mg | Freq: Once | INTRAMUSCULAR | Status: AC
  Administered 2019-05-05: 18:00:00 125 mg via INTRAVENOUS
  Filled 2019-05-05: qty 2

## 2019-05-05 MED ORDER — IOHEXOL 350 MG/ML SOLN
100.0000 mL | Freq: Once | INTRAVENOUS | Status: AC | PRN
  Administered 2019-05-05: 17:00:00 100 mL via INTRAVENOUS

## 2019-05-05 MED ORDER — LIDOCAINE VISCOUS HCL 2 % MT SOLN
15.0000 mL | Freq: Once | OROMUCOSAL | Status: AC
  Administered 2019-05-05: 20:00:00 15 mL via ORAL
  Filled 2019-05-05: qty 15

## 2019-05-05 MED ORDER — SODIUM CHLORIDE 0.9% FLUSH: 3.0000 mL | Freq: Once | INTRAVENOUS | Status: DC

## 2019-05-05 NOTE — Discharge Instructions (Signed)
As we discussed, your tests do not identify the reason for your chest and throat pain but they do not show the presence of any dangerous condition.  You are safe to be discharged home.   Follow up with your doctor for recheck and any further evaluation felt appropriate.   Return to the emergency department anytime you feel new or worsening symptoms.

## 2019-05-05 NOTE — ED Triage Notes (Signed)
Pt to triage via [facility] from home.  Wears 2 liters O2 at home.  C/o pain to chest that radiates up to throat, pain between shoulder blades, and  SOB.  Pain worse with deep inspiration and moving.

## 2019-05-05 NOTE — Progress Notes (Signed)
[facility] consulted to obtain IV access for CT scan. Upon arrival in ED, pt noted to be out of room and in CT. Spoke with pt's nurse in ED; [facility] services no longer needed.

## 2019-05-05 NOTE — ED Notes (Signed)
Patient verbalizes understanding of discharge instructions. Opportunity for questioning and answers were provided. Armband removed by staff, pt discharged from ED.  

## 2019-05-05 NOTE — ED Provider Notes (Signed)
C/o chest pain, SOB Into throat - no difficulty swallowing Using neb regularly without relief Bilateral lower rib pain Recent admission, appendicitis, x 1 month ?PE - CTA negative  Getting GI cocktail Re-exam afterward, consider toradol  8:15 - Intro and re-exam  The patient is eating a sandwich. No difficulty swallowing. GI cocktail provided. She reports the pain in her chest is still there but the, what sounds like, globus sensation is improved.   Discussed the extent of her studies tonight and that she is felt safe and appropriate for discharge home. She is comfortable with this plan and plans on outpatient follow up.      Charlann Lange, PA-C 05/05/19 2018    Quintella Reichert, MD 05/07/19 1430

## 2019-05-05 NOTE — ED Provider Notes (Signed)
[facility] EMERGENCY DEPARTMENT Provider Note   CSN: [id number] Arrival date & time: 05/05/19  1304     History Chief Complaint  Patient presents with  . Chest Pain  . Shortness of Breath    Joan [name] is a 59 y.o. female with history of COPD who presents with chest pain and SOB. She states that she's been short of breath for the past 2-3 days. She states that she gets pneumonia a lot so thought her symptoms were from this. She started to use her nebulizer regularly but states that the SOB has been persistent. She also started to have bilateral rib pain which is a common problem. This feels like a jabbing pain like she is wearing a tight bra. She also has had pain in her anterior neck. She has difficulty describing how this feels. She states it feels somewhat like when she has been extubated. She also has a chronic cough and states she can't get anything up and feels like something is stuck. She denies a sore throat or difficulty swallowing. She wears O2 at night and prn during the day but has been wearing it constantly due to SOB. She denies fever, N/V. She has chronic constipation. No leg swelling or calf pain. She was admitted last month for perforated and gangrenous appendix and underwent appendectomy and had a prolonged hospital stay due to need for IV abx.  HPI     Past Medical History:  Diagnosis Date  . Acute respiratory failure with hypoxia ([place])   . Alcoholism and drug addiction in family   . Anxiety   . Arthritis    "knees, hips, lower back" (09/25/2016)  . Asthma   . Bipolar disorder ([place])   . Chronic bronchitis ([place])    "all the time" (09/25/2016)  . Chronic leg pain    RLE  . Chronic lower back pain   . COPD (chronic obstructive pulmonary disease) ([place])   . Depression   . Diabetes mellitus without complication ([place])   . Emphysema of lung ([place])   . GERD (gastroesophageal reflux disease)   . Headache   . Hepatitis C    "treated back in  2001-2002"  . High cholesterol   . Hypertension   . Hypothyroidism   . Incarcerated ventral hernia 04/04/2017  . On home oxygen therapy    "2L; at nighttime" (04/04/2017)  . OSA on CPAP    CPAP with Oxygen 2 liters  . Pneumonia    "all the time" (09/25/2016)  . Positive PPD    with negative chest x ray  . Tachycardia    patient reports with exertion ; denies any other conditions   . Thyroid disease     Patient Active Problem List   Diagnosis Date Noted  . Acute appendicitis 03/30/2019  . COPD (chronic obstructive pulmonary disease) ([place]) 03/30/2019  . Obese 01/27/2019  . S/P left TH revision 01/26/2019  . Neck pain 12/18/2018  . Chest pain of uncertain etiology [id number]  . Hypertension   . Medication management 11/06/2018  . Acute on chronic respiratory failure with hypoxia ([place]) 05/21/2018  . Cough 03/25/2018  . Tobacco abuse 01/15/2018  . Hypomagnesemia 12/29/2017  . COPD exacerbation ([place]) 12/28/2017  . Insomnia 12/11/2017  . Pulmonary nodule 11/11/2017  . Carotid artery disease ([place]) 11/05/2017  . Hoarseness 09/09/2017  . OSA on CPAP 08/07/2017  . Family history of heart disease 07/18/2017  . GERD without esophagitis 06/23/2017  . H/O total hip arthroplasty, left  06/18/2017  . Hypothyroidism due to acquired atrophy of thyroid 04/05/2017  . Primary osteoarthritis involving multiple joints 04/05/2017  . Incarcerated ventral hernia 04/04/2017  . Chronic constipation 02/10/2017  . Iron deficiency anemia due to dietary causes 02/10/2017  . Osteoarthritis 02/10/2017  . Dyslipidemia 02/10/2017  . H/O total hip arthroplasty, right 02/05/2017  . Rash 01/08/2017  . Pre-operative clearance 12/16/2016  . Chronic respiratory failure with hypoxia ([place]) 12/16/2016  . Essential hypertension 11/29/2016  . Back pain 11/29/2016  . Vocal cord dysfunction   . COPD, severe ([place]) 09/24/2016  . Anxiety 09/24/2016  . Steroid-induced diabetes ([place]) 09/24/2016    Past Surgical  History:  Procedure Laterality Date  . BACK SURGERY    . BIOPSY  11/24/2018   Procedure: BIOPSY;  Surgeon: Thornton Park, MD;  Location: [facility];  Service: Gastroenterology;;  . CARDIAC CATHETERIZATION    . COLONOSCOPY    . COLONOSCOPY WITH PROPOFOL N/A 11/24/2018   Procedure: COLONOSCOPY WITH PROPOFOL;  Surgeon: Thornton Park, MD;  Location: [facility];  Service: Gastroenterology;  Laterality: N/A;  . CYST EXCISION     removal of cyst in sinuses  . DILATION AND CURETTAGE OF UTERUS    . ESOPHAGOGASTRODUODENOSCOPY (EGD) WITH PROPOFOL N/A 11/24/2018   Procedure: ESOPHAGOGASTRODUODENOSCOPY (EGD) WITH PROPOFOL;  Surgeon: Thornton Park, MD;  Location: [facility];  Service: Gastroenterology;  Laterality: N/A;  . FOOT SURGERY Bilateral    bone removed from 4th and 5 th toes and put in pin then took pin out  . HERNIA REPAIR    . INCISION AND DRAINAGE  ~ 2014/2015   "removed mesh & infection"  . INCISIONAL HERNIA REPAIR N/A 04/04/2017   Procedure: LAPAROSCOPIC INCISIONAL HERNIA REPAIR WITH MESH;  Surgeon: Fanny Skates, MD;  Location: [facility];  Service: General;  Laterality: N/A;  . LACERATION REPAIR Right    repair wrist artery from laceration from ice skate  . LAPAROSCOPIC APPENDECTOMY N/A 03/31/2019   Procedure: APPENDECTOMY LAPAROSCOPIC;  Surgeon: Ralene Ok, MD;  Location: [facility];  Service: General;  Laterality: N/A;  . LAPAROSCOPIC INCISIONAL / UMBILICAL / VENTRAL HERNIA REPAIR  04/04/2017   LAPAROSCOPIC INCISIONAL HERNIA REPAIR WITH MESH  . LIPOMA EXCISION Right    fattty lipoma in neck  . NASAL SEPTUM SURGERY    . POSTERIOR LUMBAR FUSION  2015/2016 X 2   "added rods and screws"  . TOTAL HIP ARTHROPLASTY Right 02/05/2017   Procedure: RIGHT TOTAL HIP ARTHROPLASTY;  Surgeon: [name], MD;  Location: [facility];  Service: Orthopedics;  Laterality: Right;  . TOTAL HIP ARTHROPLASTY Left 06/18/2017   Procedure: LEFT TOTAL HIP ARTHROPLASTY;  Surgeon: [name],  MD;  Location: [facility];  Service: Orthopedics;  Laterality: Left;  . TOTAL HIP REVISION Left 01/26/2019   Procedure: TOTAL HIP REVISION;  Surgeon: Paralee Cancel, MD;  Location: [facility];  Service: Orthopedics;  Laterality: Left;  2 hrs  . TUBAL LIGATION    . UMBILICAL HERNIA REPAIR  ~ 2013   w/mesh  . UPPER GI ENDOSCOPY       OB History   No obstetric history on file.     Family History  Problem Relation Age of Onset  . Diabetes Mother   . Hypertension Mother   . Hypertension Father   . Cancer Father        lung    Social History   Tobacco Use  . Smoking status: Former Smoker    Packs/day: 1.00    Years: 44.00  Pack years: 44.00    Types: Cigarettes    Quit date: 11/30/2018    Years since quitting: 0.4  . Smokeless tobacco: Never Used  . Tobacco comment: Smoker 1-1.5 PPD every day, 11-20-2018 "i got myself down to half a pack", i am gonna be going on chantix on the 28th"  Substance Use Topics  . Alcohol use: Not Currently  . Drug use: Not Currently    Types: "Crack" cocaine    Comment: 04/04/2017 "nothing since 01/12/2012"    Home Medications Prior to Admission medications   Medication Sig Start Date End Date Taking? Authorizing Provider  acetaminophen (TYLENOL) 500 MG tablet Take 2 tablets (1,000 mg total) by mouth every 8 (eight) hours as needed for mild pain. 04/14/19   Meuth, Blaine Hamper, PA-C  acyclovir (ZOVIRAX) 400 MG tablet Take 1 tablet (400 mg total) by mouth 2 (two) times daily. Patient taking differently: Take 400 mg by mouth daily.  06/01/18   [name], MD  albuterol (PROVENTIL) (2.5 MG/3ML) 0.083% nebulizer solution Take 3 mLs (2.5 mg total) by nebulization 2 (two) times daily. And every 6 hours as needed.  DX: J44.9 Patient taking differently: Take 2.5 mg by nebulization 2 (two) times daily as needed. And every 6 hours as needed.  DX: J44.9 11/27/18   [name], NP  atenolol (TENORMIN) 25 MG tablet TAKE 1 TABLET BY MOUTH  DAILY Patient taking  differently: Take 25 mg by mouth daily.  03/12/19   [name], MD  atorvastatin (LIPITOR) 20 MG tablet TAKE 1 TABLET BY MOUTH  DAILY Patient taking differently: Take 20 mg by mouth daily at 6 PM.  03/12/19   [name], MD  budesonide-formoterol [facility]) 160-4.5 MCG/ACT inhaler Inhale 2 puffs into the lungs 2 (two) times daily. 06/24/18   [name], NP  buPROPion (WELLBUTRIN XL) 150 MG 24 hr tablet Take 1 tablet (150 mg total) by mouth 2 (two) times daily. Patient taking differently: Take 150 mg by mouth daily.  11/13/18   [name], NP  diphenhydrAMINE (BENADRYL) 25 MG tablet Take 25 mg by mouth 2 (two) times daily.     [provider]  docusate sodium (COLACE) 100 MG capsule Take 1 capsule (100 mg total) by mouth 2 (two) times daily. 01/27/19   Danae Orleans, PA-C  gabapentin (NEURONTIN) 300 MG capsule TAKE 1 CAPSULE BY MOUTH  TWICE DAILY Patient taking differently: Take 600 mg by mouth at bedtime.  03/12/19   [name], MD  hydrochlorothiazide (HYDRODIURIL) 25 MG tablet TAKE 1 TABLET BY MOUTH  DAILY Patient taking differently: Take 25 mg by mouth daily.  03/12/19   [name], MD  Ipratropium-Albuterol (COMBIVENT RESPIMAT) 20-100 MCG/ACT AERS respimat Inhale 1 puff into the lungs every 6 (six) hours as needed for wheezing. Patient taking differently: Inhale 2 puffs into the lungs every 6 (six) hours as needed for wheezing.  04/17/18   [name], MD  levothyroxine (SYNTHROID) 25 MCG tablet TAKE 1 TABLET BY MOUTH  DAILY BEFORE BREAKFAST Patient taking differently: Take 25 mcg by mouth daily before breakfast.  03/12/19   [name], MD  Lin[facility] ARTHRITIS PAIN RELIEF EX) Apply 1 patch topically daily as needed (pain).    [provider]  Melatonin 10 MG TABS Take 10 mg by mouth at bedtime.    [provider]  montelukast (SINGULAIR) 10 MG tablet TAKE 1 TABLET BY MOUTH AT  BEDTIME Patient  taking  differently: Take 10 mg by mouth at bedtime.  03/19/19   [name], MD  Multiple Vitamins-Minerals (WOMENS 50+ MULTI VITAMIN/MIN PO) Take 1 tablet by mouth daily.    [provider]  naproxen (NAPROSYN) 500 MG tablet naproxen 500 mg tablet   500 mg by oral route.    [provider]  nystatin-triamcinolone ointment (MYCOLOG) Apply 1 application topically 2 (two) times daily as needed (applied to skin folds/groins/under breast skin irritation rash.). 09/08/17   [name], MD  OXYGEN Inhale 2 L into the lungs at bedtime. May use 2.5 - 3 L during the day with strenuous activity Plugged it to CPAP    [provider]  pantoprazole (PROTONIX) 40 MG tablet TAKE 1 TABLET BY MOUTH  DAILY Patient taking differently: Take 40 mg by mouth daily.  03/12/19   [name], MD  polyethylene glycol (MIRALAX / GLYCOLAX) 17 g packet Take 17 g by mouth daily as needed for mild constipation. 04/14/19   Meuth, Brooke A, PA-C  QUEtiapine (SEROQUEL) 50 MG tablet TAKE 1 TABLET BY MOUTH EVERYDAY AT BEDTIME Patient not taking: No sig reported 03/22/19   [name], MD  roflumilast (DALIRESP) 500 MCG TABS tablet Take 1 tablet (500 mcg total) by mouth daily. 11/06/18   [name], NP  traZODone (DESYREL) 100 MG tablet TAKE 1 TABLET BY MOUTH AT  BEDTIME Patient taking differently: Take 100 mg by mouth at bedtime.  01/11/19   [name], MD  venlafaxine XR (EFFEXOR-XR) 150 MG 24 hr capsule TAKE 1 CAPSULE BY MOUTH  DAILY WITH BREAKFAST Patient taking differently: Take 150 mg by mouth daily with breakfast. TAKE 1 CAPSULE BY MOUTH  DAILY WITH BREAKFAST 01/11/19   [name], MD    Allergies    Keflex [cephalexin], Prednisone, Shellfish allergy, Tetracyclines & related, Dilaudid [hydromorphone hcl], Incruse ellipta [umeclidinium bromide], and Tuberculin tests  Review of Systems   Review of Systems  Constitutional: Negative for chills and fever.    HENT: Negative for sore throat and trouble swallowing.   Respiratory: Positive for cough, shortness of breath and wheezing.   Cardiovascular: Positive for chest pain. Negative for leg swelling.  Gastrointestinal: Negative for abdominal pain, nausea and vomiting.  Musculoskeletal: Positive for neck pain.  All other systems reviewed and are negative.   Physical Exam Updated Vital Signs BP 106/63 (BP Location: Right Arm)   Pulse 68   Temp 99.2 F (37.3 C) (Oral)   Resp 19   SpO2 100%   Physical Exam Vitals and nursing note reviewed.  Constitutional:      General: She is not in acute distress.    Appearance: She is well-developed. She is not ill-appearing.     Comments: Cooperative. Mild distress  HENT:     Head: Normocephalic and atraumatic.     Mouth/Throat:     Mouth: Mucous membranes are dry.  Eyes:     General: No scleral icterus.       Right eye: No discharge.        Left eye: No discharge.     Conjunctiva/sclera: Conjunctivae normal.     Pupils: Pupils are equal, round, and reactive to light.  Cardiovascular:     Rate and Rhythm: Normal rate and regular rhythm.  Pulmonary:     Effort: Tachypnea present. No accessory muscle usage or respiratory distress.     Breath sounds: Decreased air movement present. Decreased breath sounds and wheezing present.  Abdominal:  General: There is no distension.  Musculoskeletal:     Cervical back: Normal range of motion.     Right lower leg: No edema.     Left lower leg: No edema.  Skin:    General: Skin is warm and dry.  Neurological:     Mental Status: She is alert and oriented to person, place, and time.  Psychiatric:        Behavior: Behavior normal.     ED Results / Procedures / Treatments   Labs (all labs ordered are listed, but only abnormal results are displayed) Labs Reviewed  BASIC METABOLIC PANEL - Abnormal; Notable for the following components:      Result Value   Potassium 3.2 (*)    Chloride 97 (*)     Glucose, Bld 127 (*)    Creatinine, Ser 1.02 (*)    All other components within normal limits  CBC - Abnormal; Notable for the following components:   WBC 10.9 (*)    All other components within normal limits  TROPONIN I (HIGH SENSITIVITY)  TROPONIN I (HIGH SENSITIVITY)    EKG EKG Interpretation  Date/Time:  Wednesday May 05 2019 15:50:22 EST Ventricular Rate:  70 PR Interval:    QRS Duration: 99 QT Interval:  425 QTC Calculation: 459 R Axis:   84 Text Interpretation: Sinus rhythm Anteroseptal infarct, age indeterminate Confirmed by Quintella Reichert [id number]) on 05/05/2019 4:10:58 PM   Radiology DG Chest 2 View  Result Date: 05/05/2019 CLINICAL DATA:  Central chest pain with shortness of breath for 1 day. History of asthma/COPD, diabetes and pneumonia. EXAM: CHEST - 2 VIEW COMPARISON:  Radiographs 11/09/2018. CT 01/05/2019. FINDINGS: The heart size and mediastinal contours are stable. There is grossly stable chronic lung disease with central airway and fissural thickening. There is mild scarring at both lung bases. No airspace disease, edema, pleural effusion or pneumothorax identified. The bones appear unchanged. IMPRESSION: Stable chronic lung disease. No acute cardiopulmonary process. Electronically Signed   By: Richardean Sale M.D.   On: 05/05/2019 14:09   CT Angio Chest PE W/Cm &/Or Wo Cm  Result Date: 05/05/2019 CLINICAL DATA:  Chest pain and shortness of breath EXAM: CT ANGIOGRAPHY CHEST WITH CONTRAST TECHNIQUE: Multidetector CT imaging of the chest was performed using the standard protocol during bolus administration of intravenous contrast. Multiplanar CT image reconstructions and MIPs were obtained to evaluate the vascular anatomy. CONTRAST:  136mL OMNIPAQUE IOHEXOL 350 MG/ML SOLN COMPARISON:  11/26/2018 FINDINGS: Cardiovascular: Thoracic aorta and its branches demonstrate atherosclerotic calcifications. No aneurysmal dilatation or dissection is seen. No cardiac enlargement is  noted. Mitral calcifications are seen. Heavy coronary calcifications are noted as well. The pulmonary artery shows a normal branching pattern. No definitive filling defect to suggest pulmonary embolism is noted. Mediastinum/Nodes: Thoracic inlet is within normal limits. No sizable hilar or mediastinal adenopathy is noted. The esophagus is within normal limits. Lungs/Pleura: The lungs are well aerated bilaterally. Emphysematous changes are seen. Some scarring is noted in the right upper lobe stable from the prior exam. Mild bibasilar atelectasis is noted. No sizable effusion or pneumothorax is seen. Upper Abdomen: Visualized upper abdomen demonstrates some calcified granulomas within the spleen. Musculoskeletal: Mild degenerative changes of the thoracic spine are noted. Review of the MIP images confirms the above findings. IMPRESSION: No evidence of pulmonary emboli. Changes of prior granulomatous disease. No other focal abnormality is seen. Aortic Atherosclerosis (ICD10-I70.0) and Emphysema (ICD10-J43.9). Electronically Signed   By: Inez Catalina M.D.   On: 05/05/2019  17:43    Procedures Procedures (including critical care time)  Medications Ordered in ED Medications  sodium chloride flush (NS) 0.9 % injection 3 mL (has no administration in time range)  alum & mag hydroxide-simeth (MAALOX/MYLANTA) 200-200-20 MG/5ML suspension 30 mL (has no administration in time range)    And  lidocaine (XYLOCAINE) 2 % viscous mouth solution 15 mL (has no administration in time range)  albuterol (VENTOLIN HFA) 108 (90 Base) MCG/ACT inhaler 2 puff (2 puffs Inhalation Given 05/05/19 1635)  methylPREDNISolone sodium succinate (SOLU-MEDROL) 125 mg/2 mL injection 125 mg (125 mg Intravenous Given 05/05/19 1804)  iohexol (OMNIPAQUE) 350 MG/ML injection 100 mL (100 mLs Intravenous Contrast Given 05/05/19 1721)    ED Course  I have reviewed the triage vital signs and the nursing notes.  Pertinent labs & imaging results that  were available during my care of the patient were reviewed by me and considered in my medical decision making (see chart for details).  59 year old female presents with chest pain throat pain and shortness of breath.  Her vital signs are normal.  On exam she is uncomfortable appearing.  EKG is sinus rhythm.  Heart is regular rate and rhythm.  Lung sounds are decreased and she has expiratory wheezes.  Chest is nontender.  Abdomen is nontender.  There is no leg swelling.  Chest x-ray shows chronic changes.  CBC shows mild leukocytosis.  BMP shows mild hypokalemia.  Initial troponin is 3.  Due to recent surgery and pleuritic chest pain will obtain CTA of the chest to rule out PE.  CT is negative for PE.  Shared visit with Dr. Ralene Bathe.  Will give trial of a GI cocktail with lidocaine and reassess.  Also could consider NSAIDs for pleuritic pain.  Patient signed out to [facility] up still PA-C who will dispo.  MDM Rules/Calculators/A&P   Final Clinical Impression(s) / ED Diagnoses Final diagnoses:  Atypical chest pain  COPD exacerbation [facility])    Rx / DC Orders ED Discharge Orders    None       Recardo Evangelist, PA-C 05/05/19 1924    Quintella Reichert, MD 05/07/19 1734

## 2019-05-06 ENCOUNTER — Ambulatory Visit (HOSPITAL_COMMUNITY): Payer: Self-pay | Admitting: Psychiatry

## 2019-05-07 ENCOUNTER — Ambulatory Visit (INDEPENDENT_AMBULATORY_CARE_PROVIDER_SITE_OTHER): Payer: Medicare Other | Admitting: Licensed Clinical Social Worker

## 2019-05-07 DIAGNOSIS — F0630 Mood disorder due to known physiological condition, unspecified: Secondary | ICD-10-CM | POA: Diagnosis not present

## 2019-05-07 DIAGNOSIS — F4312 Post-traumatic stress disorder, chronic: Secondary | ICD-10-CM

## 2019-05-07 DIAGNOSIS — F411 Generalized anxiety disorder: Secondary | ICD-10-CM | POA: Diagnosis not present

## 2019-05-07 DIAGNOSIS — R443 Hallucinations, unspecified: Secondary | ICD-10-CM | POA: Diagnosis not present

## 2019-05-07 NOTE — Progress Notes (Signed)
Virtual Visit via Telephone Note  I connected with Joan [name] on 05/07/19 at  9:00 AM EST by telephone and verified that I am speaking with the correct person using two identifiers.   I discussed the limitations, risks, security and privacy concerns of performing an evaluation and management service by telephone and the availability of in person appointments. I also discussed with the patient that there may be a patient responsible charge related to this service. The patient expressed understanding and agreed to proceed.   I discussed the assessment and treatment plan with the patient. The patient was provided an opportunity to ask questions and all were answered. The patient agreed with the plan and demonstrated an understanding of the instructions.   The patient was advised to call back or seek an in-person evaluation if the symptoms worsen or if the condition fails to improve as anticipated.  I provided 55 minutes of non-face-to-face time during this encounter.  THERAPIST PROGRESS NOTE  Session Time: 9:00 AM to 9:55 AM  Participation Level: Active  Behavioral Response: CasualAlertEuthymic  Type of Therapy: Individual Therapy  Treatment Goals addressed: PTSD, coping, hallucinations  Interventions: Solution Focused, Strength-based, Supportive, Reframing and Other: coping  Summary: Joan [name] is a 59 y.o. female who presents most recent medical issue. It hurt when breathed in. Laid down it got worse. Talking about it with PT comes to house and they called doctor. Doctor told her to go to ER. They didn't know what it was. Hurts around trachea area. Things it was overblown. Did get something to coat and sooth that area. Patient started her morning and pets the dog and shares that she loves Gregary Cromer. Reports she is doing so good, started to have more energy, moving around more and doing things. She shares this is good for her. Makes her happy instead of sitting in chair or bed or going  to pee. Only goes out to see doctor and does everything on that day. Discussed significant financial stressors she is applying for financial loan to pay off all her debts. Significant financial stressors.  Shared doing a lot for Christmas went overboard and will do that again although will make sure to do something for her older son crack does not get as much reviewed names of her children Joan [name] is oldest, Joan [name], then Joan [name]. Shared with better weather will sit in the sun. Used to be addicted to tanning. Would do all day long.  Shared a quality of hers that when she enjoys something she enjoys it to the fullest. Reviewed highlight of day include sitting in backyard or sit on back deck. Hip is weak now but can exercise and no hip precautions.(there was some set back with hip) Wants to start walking out in backyard. Needs roller blade walker from car.  Therapist pointed out she sounds like she has lots of motivation. Patient relates that past three days wake up with energy and moving around.  She realizes moving is helpful as it helps her stomach be active which is good for it, which helps get her bowels moving. Shared she has arranged things so brother medical proxy, he has to talk to daughter about things, Gwenlyn Found is power of attorney. She is in charge of things. She wants to move patient to [place] to be closer to kids. Love to be closer to kids and closer so they could see her. Doesn't want to be stuck there and nobody come to see her. Before they lived 10-15 minutes  down the road and nobody came to see her. Looking at assisted living.  Going to tell her daughter to slow down and need time to think about it. Loves it here. Occasional arguments with brother that go way out of control. Doesn't know if can have own car at assisted living. Can't afford to pay anything has to pay bills. Rather be here where can enjoy the weather.  Patient reviewed that recent surgery she was very close to dying.  Therapist  encouraged her with insight not waste time these situations give [place] insight of making the most of our time.                 Suicidal/Homicidal: No  Therapist Response: Therapist reviewed symptoms, facilitated expression of thoughts and feelings, provided active listening, open questioning, actively working on treatment goals of coping as patient has been managing severe medical issues so therapist provided positive feedback to enhance patient's own motivation to engage in activities to help with healing, help with mood.  Related that more energy and increase in activity will further enhance mood and continue her in a positive direction.  Talked about engagement in simple activities can be off to enhance quality of life.  Provided insight that the simple activities can her not to be a big activities of her life, that for things to happen for [place] to enjoy her life it is in our powerr and control that recognition gives [place] power but also means we are the ones that have to make it happen.  Working with patient on concept of self efficacy to enhance insight about this.  Work with patient on reviewing pros and cons of staying or moving close to family, starting to identify some of her feelings and helping her to gain insight to make a good decision for herself.  Therapist provided strength based and supportive intervention  Plan: Return again in 3 weeks.2.  Therapist work with patient on mood regulation, stress management supportive interventions as patient recovers from medical issues  Diagnosis: Axis I: PTSD, hallucinations, GAD, Mood Disorder in conditions classified elsewhere   Axis II: No diagnosis    [name], LCSW 05/07/2019

## 2019-05-09 DIAGNOSIS — J9611 Chronic respiratory failure with hypoxia: Secondary | ICD-10-CM | POA: Diagnosis not present

## 2019-05-09 DIAGNOSIS — J449 Chronic obstructive pulmonary disease, unspecified: Secondary | ICD-10-CM | POA: Diagnosis not present

## 2019-05-09 DIAGNOSIS — J441 Chronic obstructive pulmonary disease with (acute) exacerbation: Secondary | ICD-10-CM | POA: Diagnosis not present

## 2019-05-10 ENCOUNTER — Other Ambulatory Visit: Payer: Self-pay

## 2019-05-10 ENCOUNTER — Encounter (HOSPITAL_COMMUNITY): Payer: Self-pay | Admitting: Psychiatry

## 2019-05-10 ENCOUNTER — Ambulatory Visit (INDEPENDENT_AMBULATORY_CARE_PROVIDER_SITE_OTHER): Payer: Medicare Other | Admitting: Psychiatry

## 2019-05-10 DIAGNOSIS — R443 Hallucinations, unspecified: Secondary | ICD-10-CM

## 2019-05-10 DIAGNOSIS — F411 Generalized anxiety disorder: Secondary | ICD-10-CM | POA: Diagnosis not present

## 2019-05-10 DIAGNOSIS — F4312 Post-traumatic stress disorder, chronic: Secondary | ICD-10-CM

## 2019-05-10 DIAGNOSIS — F0630 Mood disorder due to known physiological condition, unspecified: Secondary | ICD-10-CM | POA: Diagnosis not present

## 2019-05-10 MED ORDER — QUETIAPINE FUMARATE 25 MG PO TABS: 25.0000 mg | ORAL_TABLET | Freq: Every day | ORAL | 0 refills | Status: DC

## 2019-05-10 NOTE — Progress Notes (Signed)
[place] Follow up visit  Patient Identification: Joan [name] MRN:  [id number] Date of Evaluation:  05/10/2019 Referral Source:Therapist [name] Chief Complaint:   depression follow up  Visit Diagnosis:    ICD-10-CM   1. Mood disorder in conditions classified elsewhere  F06.30   2. GAD (generalized anxiety disorder)  F41.1   3. Post-traumatic stress disorder, chronic  F43.12   4. Hallucinations  R44.3       I connected with Joan [name] on 05/10/19 at  2:45 PM EDT by telephone and verified that I am speaking with the correct person using two identifiers.      I discussed the limitations of evaluation and management by telemedicine and the availability of in person appointments. The patient expressed understanding and agreed to proceed.  History of Present Illness: Patient is a 59 years old currently widow Caucasian female referred by her therapist for management of mood symptoms, depression possible PTSD and anxiety  Doing fair since hospital discharge for appendix   Was seeing shadows, seroquel was started but at 50mg  she sees a bug . Wants to cut dose down Otherwise mood wise is fair. Get most other meds from primary care    Has sufferred from grief and trauma  Worries fluctuate  Aggravating factor; difficult childhood, Grief, difficult relationship with middle kid, hip replacement Modifying factor: siblings, but still has difficult time with them   Duration adult life Severity of depression: denies hopelessness Some hallucinations after husband death, but doing better     Past Psychiatric History: depression, drug use  Previous Psychotropic Medications: Yes   Substance Abuse History in the last 12 months:  No.  Consequences of Substance Abuse: history of extensive drug use uptil 8 years ago and have been in rehab programs  Past Medical History:  Past Medical History:  Diagnosis Date  . Acute respiratory failure with hypoxia ([place])   . Alcoholism and drug  addiction in family   . Anxiety   . Arthritis    "knees, hips, lower back" (09/25/2016)  . Asthma   . Bipolar disorder ([place])   . Chronic bronchitis ([place])    "all the time" (09/25/2016)  . Chronic leg pain    RLE  . Chronic lower back pain   . COPD (chronic obstructive pulmonary disease) ([place])   . Depression   . Diabetes mellitus without complication ([place])   . Emphysema of lung ([place])   . GERD (gastroesophageal reflux disease)   . Headache   . Hepatitis C    "treated back in 2001-2002"  . High cholesterol   . Hypertension   . Hypothyroidism   . Incarcerated ventral hernia 04/04/2017  . On home oxygen therapy    "2L; at nighttime" (04/04/2017)  . OSA on CPAP    CPAP with Oxygen 2 liters  . Pneumonia    "all the time" (09/25/2016)  . Positive PPD    with negative chest x ray  . Tachycardia    patient reports with exertion ; denies any other conditions   . Thyroid disease     Past Surgical History:  Procedure Laterality Date  . BACK SURGERY    . BIOPSY  11/24/2018   Procedure: BIOPSY;  Surgeon: Thornton Park, MD;  Location: [facility];  Service: Gastroenterology;;  . CARDIAC CATHETERIZATION    . COLONOSCOPY    . COLONOSCOPY WITH PROPOFOL N/A 11/24/2018   Procedure: COLONOSCOPY WITH PROPOFOL;  Surgeon: Thornton Park, MD;  Location: [facility];  Service: Gastroenterology;  Laterality: N/A;  . CYST EXCISION     removal of cyst in sinuses  . DILATION AND CURETTAGE OF UTERUS    . ESOPHAGOGASTRODUODENOSCOPY (EGD) WITH PROPOFOL N/A 11/24/2018   Procedure: ESOPHAGOGASTRODUODENOSCOPY (EGD) WITH PROPOFOL;  Surgeon: Thornton Park, MD;  Location: [facility];  Service: Gastroenterology;  Laterality: N/A;  . FOOT SURGERY Bilateral    bone removed from 4th and 5 th toes and put in pin then took pin out  . HERNIA REPAIR    . INCISION AND DRAINAGE  ~ 2014/2015   "removed mesh & infection"  . INCISIONAL HERNIA REPAIR N/A 04/04/2017   Procedure: LAPAROSCOPIC INCISIONAL HERNIA REPAIR  WITH MESH;  Surgeon: Fanny Skates, MD;  Location: [facility];  Service: General;  Laterality: N/A;  . LACERATION REPAIR Right    repair wrist artery from laceration from ice skate  . LAPAROSCOPIC APPENDECTOMY N/A 03/31/2019   Procedure: APPENDECTOMY LAPAROSCOPIC;  Surgeon: Ralene Ok, MD;  Location: [facility];  Service: General;  Laterality: N/A;  . LAPAROSCOPIC INCISIONAL / UMBILICAL / VENTRAL HERNIA REPAIR  04/04/2017   LAPAROSCOPIC INCISIONAL HERNIA REPAIR WITH MESH  . LIPOMA EXCISION Right    fattty lipoma in neck  . NASAL SEPTUM SURGERY    . POSTERIOR LUMBAR FUSION  2015/2016 X 2   "added rods and screws"  . TOTAL HIP ARTHROPLASTY Right 02/05/2017   Procedure: RIGHT TOTAL HIP ARTHROPLASTY;  Surgeon: [name], MD;  Location: [facility];  Service: Orthopedics;  Laterality: Right;  . TOTAL HIP ARTHROPLASTY Left 06/18/2017   Procedure: LEFT TOTAL HIP ARTHROPLASTY;  Surgeon: [name], MD;  Location: [facility];  Service: Orthopedics;  Laterality: Left;  . TOTAL HIP REVISION Left 01/26/2019   Procedure: TOTAL HIP REVISION;  Surgeon: Paralee Cancel, MD;  Location: [facility];  Service: Orthopedics;  Laterality: Left;  2 hrs  . TUBAL LIGATION    . UMBILICAL HERNIA REPAIR  ~ 2013   w/mesh  . UPPER GI ENDOSCOPY      Family Psychiatric History: FAther : alcohol use  Family History:  Family History  Problem Relation Age of Onset  . Diabetes Mother   . Hypertension Mother   . Hypertension Father   . Cancer Father        lung    Social History:   Social History   Socioeconomic History  . Marital status: Widowed    Spouse name: Not on file  . Number of children: 3  . Years of education: Not on file  . Highest education level: Not on file  Occupational History  . Not on file  Tobacco Use  . Smoking status: Former Smoker    Packs/day: 1.00    Years: 44.00    Pack years: 44.00    Types: Cigarettes    Quit date: 11/30/2018    Years since quitting: 0.4  . Smokeless tobacco: Never  Used  . Tobacco comment: Smoker 1-1.5 PPD every day, 11-20-2018 "i got myself down to half a pack", i am gonna be going on chantix on the 28th"  Substance and Sexual Activity  . Alcohol use: Not Currently  . Drug use: Not Currently    Types: "Crack" cocaine    Comment: 04/04/2017 "nothing since 01/12/2012"  . Sexual activity: Never  Other Topics Concern  . Not on file  Social History Narrative  . Not on file   Social Determinants of Health   Financial Resource Strain:   . Difficulty of Paying Living Expenses:   Food Insecurity:   .  Worried About Charity fundraiser in the Last Year:   . Arboriculturist in the Last Year:   Transportation Needs:   . Film/video editor (Medical):   Marland Kitchen Lack of Transportation (Non-Medical):   Physical Activity:   . Days of Exercise per Week:   . Minutes of Exercise per Session:   Stress:   . Feeling of Stress :   Social Connections:   . Frequency of Communication with Friends and Family:   . Frequency of Social Gatherings with Friends and Family:   . Attends Religious Services:   . Active Member of Clubs or Organizations:   . Attends Archivist Meetings:   Marland Kitchen Marital Status:       Allergies:   Allergies  Allergen Reactions  . Keflex [Cephalexin] Rash and Other (See Comments)    Has tolerated amoxicillin since had keflex  . Prednisone Other (See Comments)    "counter reacts"  . Shellfish Allergy Shortness Of Breath, Nausea And Vomiting and Other (See Comments)    Stomach cramps  . Tetracyclines & Related Anaphylaxis, Swelling and Other (See Comments)    Throat swelling requiring hospitalization  . Dilaudid [Hydromorphone Hcl] Other (See Comments)    "Lethargy"  . Incruse Ellipta [Umeclidinium Bromide] Nausea Only  . Tuberculin Tests Other (See Comments)    False postive    Metabolic Disorder Labs: Lab Results  Component Value Date   HGBA1C 6.2 (H) 03/31/2019   MPG 131.24 03/31/2019   MPG 154.2 05/22/2018   No results  found for: PROLACTIN Lab Results  Component Value Date   CHOL 125 12/17/2018   TRIG 261.0 (H) 12/17/2018   HDL 37.80 (L) 12/17/2018   CHOLHDL 3 12/17/2018   VLDL 52.2 (H) 12/17/2018   LDLCALC 62 08/19/2017   Lab Results  Component Value Date   TSH 1.156 03/30/2019    Therapeutic Level Labs: No results found for: LITHIUM No results found for: CBMZ No results found for: VALPROATE  Current Medications: Current Outpatient Medications  Medication Sig Dispense Refill  . acetaminophen (TYLENOL) 500 MG tablet Take 2 tablets (1,000 mg total) by mouth every 8 (eight) hours as needed for mild pain. 30 tablet 0  . acyclovir (ZOVIRAX) 400 MG tablet Take 1 tablet (400 mg total) by mouth 2 (two) times daily. (Patient taking differently: Take 400 mg by mouth daily. ) 180 tablet 3  . albuterol (PROVENTIL) (2.5 MG/3ML) 0.083% nebulizer solution Take 3 mLs (2.5 mg total) by nebulization 2 (two) times daily. And every 6 hours as needed.  DX: J44.9 (Patient taking differently: Take 2.5 mg by nebulization 3 (three) times daily as needed for wheezing or shortness of breath (DX: J44.9). ) 360 mL 3  . atenolol (TENORMIN) 25 MG tablet TAKE 1 TABLET BY MOUTH  DAILY (Patient taking differently: Take 25 mg by mouth daily. ) 90 tablet 1  . atorvastatin (LIPITOR) 20 MG tablet TAKE 1 TABLET BY MOUTH  DAILY (Patient taking differently: Take 20 mg by mouth daily. ) 90 tablet 1  . budesonide-formoterol (SYMBICORT) 160-4.5 MCG/ACT inhaler Inhale 2 puffs into the lungs 2 (two) times daily. 2 Inhaler 0  . buPROPion (WELLBUTRIN XL) 150 MG 24 hr tablet Take 1 tablet (150 mg total) by mouth 2 (two) times daily. (Patient not taking: Reported on 05/05/2019) 180 tablet 3  . buPROPion (WELLBUTRIN XL) 300 MG 24 hr tablet Take 300 mg by mouth daily.    . diphenhydrAMINE (BENADRYL) 25 MG tablet Take 25  mg by mouth 2 (two) times daily.     Marland Kitchen docusate sodium (COLACE) 100 MG capsule Take 1 capsule (100 mg total) by mouth 2 (two) times  daily. (Patient not taking: Reported on 05/05/2019) 28 capsule 0  . gabapentin (NEURONTIN) 300 MG capsule TAKE 1 CAPSULE BY MOUTH  TWICE DAILY (Patient taking differently: Take 600 mg by mouth at bedtime. ) 180 capsule 1  . hydrochlorothiazide (HYDRODIURIL) 25 MG tablet TAKE 1 TABLET BY MOUTH  DAILY (Patient taking differently: Take 25 mg by mouth daily. ) 90 tablet 1  . Ipratropium-Albuterol (COMBIVENT RESPIMAT) 20-100 MCG/ACT AERS respimat Inhale 1 puff into the lungs every 6 (six) hours as needed for wheezing. (Patient taking differently: Inhale 2 puffs into the lungs in the morning and at bedtime. ) 3 Inhaler 3  . levothyroxine (SYNTHROID) 25 MCG tablet TAKE 1 TABLET BY MOUTH  DAILY BEFORE BREAKFAST (Patient taking differently: Take 25 mcg by mouth daily before breakfast. ) 90 tablet 1  . Liniments (SALONPAS ARTHRITIS PAIN RELIEF EX) Apply 1 patch topically daily as needed (to painful sites- remove as directed).     . Melatonin 10 MG TABS Take 10 mg by mouth at bedtime.    . montelukast (SINGULAIR) 10 MG tablet TAKE 1 TABLET BY MOUTH AT  BEDTIME (Patient taking differently: Take 10 mg by mouth at bedtime. ) 90 tablet 3  . Multiple Vitamins-Minerals (ADULT ONE DAILY GUMMIES) CHEW Chew 1 tablet by mouth daily.     . naproxen (NAPROSYN) 500 MG tablet Take 500 mg by mouth in the morning and at bedtime.     Marland Kitchen nystatin-triamcinolone ointment (MYCOLOG) Apply 1 application topically 2 (two) times daily as needed (applied to skin folds/groins/under breast skin irritation rash.). (Patient taking differently: Apply 1 application topically 2 (two) times daily as needed (to skin folds/groin/under breast skin- for irritation/rashes). ) 100 g 3  . OXYGEN Inhale 2-3 L/min into the lungs See admin instructions. Inhale  2-3 L/min of oxygen into the lungs w/CPAP at bedtime and as needed for shortness of breath with "strenuous activity" during the day    . pantoprazole (PROTONIX) 40 MG tablet TAKE 1 TABLET BY MOUTH   DAILY (Patient taking differently: Take 40 mg by mouth daily. ) 90 tablet 1  . polyethylene glycol (MIRALAX / GLYCOLAX) 17 g packet Take 17 g by mouth daily as needed for mild constipation. (Patient taking differently: Take 17 g by mouth in the morning, at noon, and at bedtime. ) 28 packet 0  . QUEtiapine (SEROQUEL) 25 MG tablet Take 1 tablet (25 mg total) by mouth at bedtime. For now has 50mg  can take half at night. 30 tablet 0  . roflumilast (DALIRESP) 500 MCG TABS tablet Take 1 tablet (500 mcg total) by mouth daily. 90 tablet 3  . traZODone (DESYREL) 100 MG tablet TAKE 1 TABLET BY MOUTH AT  BEDTIME (Patient taking differently: Take 100 mg by mouth at bedtime. ) 90 tablet 3  . venlafaxine XR (EFFEXOR-XR) 150 MG 24 hr capsule TAKE 1 CAPSULE BY MOUTH  DAILY WITH BREAKFAST (Patient taking differently: Take 150 mg by mouth daily with breakfast. ) 90 capsule 3   No current facility-administered medications for this visit.     Psychiatric Specialty Exam: Review of Systems  Cardiovascular: Negative for chest pain.  Psychiatric/Behavioral: Negative for substance abuse and suicidal ideas.    There were no vitals taken for this visit.There is no height or weight on file to calculate BMI.  General  Appearance:  Eye Contact:    Speech:  Slow  Volume:  Decreased  Mood: fair  Affect:  Congruent  Thought Process:  Goal Directed  Orientation:  Full (Time, Place, and Person)  Thought Content:  Some hallucinations, visual and non commanding auditory  Suicidal Thoughts:  No  Homicidal Thoughts:  No  Memory:  Immediate;   Fair Recent;   Fair  Judgement:  Fair  Insight:  Shallow  Psychomotor Activity:  Decreased  Concentration:  Concentration: Fair and Attention Span: Fair  Recall:  [employer] of Knowledge:Fair  Language: Fair  Akathisia:  No  Handed:    AIMS (if indicated): no involuntary movements  Assets:  Desire for Improvement Leisure Time Social Support  ADL's: limited due to recent  surgery  Cognition: WNL  Sleep:  variable   Screenings: Mini-Mental     Clinical Support from 04/06/2018 in [facility]  Total Score (max 30 points )  29    PHQ2-9     Patient Outreach Telephone from 12/18/2018 in [facility] Patient Outreach Telephone from 11/18/2018 in [facility] from 04/06/2018 in [facility] Visit from 11/28/2016 in [facility]  PHQ-2 Total Score  3  1  2  1   PHQ-9 Total Score  --  --  6  --      Assessment and Plan: as follows  Mood disorder unspecified: relavant to multiple medical concerns and past history of using drugs, alcohol Recurrent depressive episodes;  Fair, continue meds including wellbutrin, effexor Hallucinations may be part of Grief, PTSD or depression or possible part of schizoaffective disorder. Feels seroqule did better at 25mg  will reduce to that dose  Not worse, seroquel has helped, take at night, avoid trazadone if too sleepy   Risk side effects discussed Has support of her brother and case/care giver since discharge  Would recommend more frequent therapy for depression, grief     I discussed the assessment and treatment plan with the patient. The patient was provided an opportunity to ask questions and all were answered. The patient agreed with the plan and demonstrated an understanding of the instructions.   The patient was advised to call back or seek an in-person evaluation if the symptoms worsen or if the condition fails to improve as anticipated. Fu 6-8 weeks, call back for refills when due Non face to face time spent: 71min   [name], MD 3/15/20212:58 PM

## 2019-05-11 ENCOUNTER — Encounter: Payer: Self-pay | Admitting: Obstetrics & Gynecology

## 2019-05-11 ENCOUNTER — Ambulatory Visit (INDEPENDENT_AMBULATORY_CARE_PROVIDER_SITE_OTHER): Payer: Medicare Other | Admitting: Obstetrics & Gynecology

## 2019-05-11 VITALS — BP 130/70 | Ht 66.5 in | Wt 200.0 lb

## 2019-05-11 DIAGNOSIS — Z13820 Encounter for screening for osteoporosis: Secondary | ICD-10-CM

## 2019-05-11 DIAGNOSIS — Z01419 Encounter for gynecological examination (general) (routine) without abnormal findings: Secondary | ICD-10-CM | POA: Diagnosis not present

## 2019-05-11 DIAGNOSIS — E6609 Other obesity due to excess calories: Secondary | ICD-10-CM

## 2019-05-11 DIAGNOSIS — Z780 Asymptomatic menopausal state: Secondary | ICD-10-CM | POA: Diagnosis not present

## 2019-05-11 DIAGNOSIS — Z6831 Body mass index (BMI) 31.0-31.9, adult: Secondary | ICD-10-CM

## 2019-05-11 NOTE — Progress Notes (Signed)
Joan [name] October 21, 1960 [id number]   History:    59 y.o. G3P3L3 Widowed  RP:  New patient presenting for annual gyn exam   HPI: Postmenopausal since her 45's.  No HRT.  No PMB.  No pelvic pain.  Abstinent since her husband passed away 8 yrs ago.  Urine/BMs normal.  [facility] 2020.  Breasts normal.  BMI 31.8.  COPD.  S/P Bilateral hip replacement.  Health Labs with Fam MD.    Past medical history,surgical history, family history and social history were all reviewed and documented in the EPIC chart.  Gynecologic History No LMP recorded. Patient is postmenopausal.  Obstetric History OB History  Gravida Para Term Preterm AB Living  3 3       3   SAB TAB Ectopic Multiple Live Births               # Outcome Date GA Lbr Len/2nd Weight Sex Delivery Anes PTL Lv  3 Para           2 Para           1 Para              ROS: A ROS was performed and pertinent positives and negatives are included in the history.  GENERAL: No fevers or chills. HEENT: No change in vision, no earache, sore throat or sinus congestion. NECK: No pain or stiffness. CARDIOVASCULAR: No chest pain or pressure. No palpitations. PULMONARY: No shortness of breath, cough or wheeze. GASTROINTESTINAL: No abdominal pain, nausea, vomiting or diarrhea, melena or bright red blood per rectum. GENITOURINARY: No urinary frequency, urgency, hesitancy or dysuria. MUSCULOSKELETAL: No joint or muscle pain, no back pain, no recent trauma. DERMATOLOGIC: No rash, no itching, no lesions. ENDOCRINE: No polyuria, polydipsia, no heat or cold intolerance. No recent change in weight. HEMATOLOGICAL: No anemia or easy bruising or bleeding. NEUROLOGIC: No headache, seizures, numbness, tingling or weakness. PSYCHIATRIC: No depression, no loss of interest in normal activity or change in sleep pattern.     Exam:   BP 130/70   Ht 5' 6.5" (1.689 m)   Wt 200 lb (90.7 kg)   BMI 31.80 kg/m   Body mass index is 31.8 kg/m.  General appearance : Well  developed well nourished female. No acute distress HEENT: Eyes: no retinal hemorrhage or exudates,  Neck supple, trachea midline, no carotid bruits, no thyroidmegaly Lungs: Clear to auscultation, no rhonchi or wheezes, or rib retractions  Heart: Regular rate and rhythm, no murmurs or gallops Breast:Examined in sitting and supine position were symmetrical in appearance, no palpable masses or tenderness,  no skin retraction, no nipple inversion, no nipple discharge, no skin discoloration, no axillary or supraclavicular lymphadenopathy Abdomen: no palpable masses or tenderness, no rebound or guarding Extremities: no edema or skin discoloration or tenderness  Pelvic: Vulva: Normal             Vagina: No gross lesions or discharge  Cervix: No gross lesions or discharge.  Pap reflex done.  Uterus  AV, normal size, shape and consistency, non-tender and mobile  Adnexa  Without masses or tenderness  Anus: Normal   Assessment/Plan:  59 y.o. female for annual exam   1. Encounter for routine gynecological examination with Papanicolaou smear of cervix Normal gynecologic exam.  Pap reflex done.  Breast exam normal.  Needs to schedule a screening mammogram now.  Colonoscopy 2020.  Health labs with family physician.  2. Postmenopause Postmenopausal, well on no hormone replacement therapy.  No postmenopausal bleeding.  3. Screening for osteoporosis Schedule bone density now.  Vitamin D supplements, calcium intake of 1200 mg daily and regular weightbearing physical activity is recommended. - DG Bone Density; Future  4. Class 1 obesity due to excess calories with serious comorbidity and body mass index (BMI) of 31.0 to 31.9 in adult Recommend a lower calorie/carb diet such as [employer].  Aerobic activities 5 times a week and light weightlifting every 2 days.  Princess Bruins MD, 11:17 AM 05/11/2019

## 2019-05-12 ENCOUNTER — Other Ambulatory Visit (HOSPITAL_COMMUNITY): Payer: Self-pay

## 2019-05-12 ENCOUNTER — Other Ambulatory Visit: Payer: Self-pay

## 2019-05-12 DIAGNOSIS — Z96643 Presence of artificial hip joint, bilateral: Secondary | ICD-10-CM | POA: Diagnosis not present

## 2019-05-12 DIAGNOSIS — E785 Hyperlipidemia, unspecified: Secondary | ICD-10-CM | POA: Diagnosis not present

## 2019-05-12 DIAGNOSIS — Z7951 Long term (current) use of inhaled steroids: Secondary | ICD-10-CM | POA: Diagnosis not present

## 2019-05-12 DIAGNOSIS — G4733 Obstructive sleep apnea (adult) (pediatric): Secondary | ICD-10-CM | POA: Diagnosis not present

## 2019-05-12 DIAGNOSIS — Z9181 History of falling: Secondary | ICD-10-CM | POA: Diagnosis not present

## 2019-05-12 DIAGNOSIS — G4700 Insomnia, unspecified: Secondary | ICD-10-CM | POA: Diagnosis not present

## 2019-05-12 DIAGNOSIS — I10 Essential (primary) hypertension: Secondary | ICD-10-CM | POA: Diagnosis not present

## 2019-05-12 DIAGNOSIS — J439 Emphysema, unspecified: Secondary | ICD-10-CM | POA: Diagnosis not present

## 2019-05-12 DIAGNOSIS — I2510 Atherosclerotic heart disease of native coronary artery without angina pectoris: Secondary | ICD-10-CM | POA: Diagnosis not present

## 2019-05-12 DIAGNOSIS — Z87891 Personal history of nicotine dependence: Secondary | ICD-10-CM | POA: Diagnosis not present

## 2019-05-12 DIAGNOSIS — J9611 Chronic respiratory failure with hypoxia: Secondary | ICD-10-CM | POA: Diagnosis not present

## 2019-05-12 DIAGNOSIS — M150 Primary generalized (osteo)arthritis: Secondary | ICD-10-CM | POA: Diagnosis not present

## 2019-05-12 DIAGNOSIS — R911 Solitary pulmonary nodule: Secondary | ICD-10-CM | POA: Diagnosis not present

## 2019-05-12 DIAGNOSIS — K5909 Other constipation: Secondary | ICD-10-CM | POA: Diagnosis not present

## 2019-05-12 DIAGNOSIS — M545 Low back pain: Secondary | ICD-10-CM | POA: Diagnosis not present

## 2019-05-12 DIAGNOSIS — G8929 Other chronic pain: Secondary | ICD-10-CM | POA: Diagnosis not present

## 2019-05-12 DIAGNOSIS — Z48815 Encounter for surgical aftercare following surgery on the digestive system: Secondary | ICD-10-CM | POA: Diagnosis not present

## 2019-05-12 DIAGNOSIS — D509 Iron deficiency anemia, unspecified: Secondary | ICD-10-CM | POA: Diagnosis not present

## 2019-05-12 DIAGNOSIS — E039 Hypothyroidism, unspecified: Secondary | ICD-10-CM | POA: Diagnosis not present

## 2019-05-12 DIAGNOSIS — E119 Type 2 diabetes mellitus without complications: Secondary | ICD-10-CM | POA: Diagnosis not present

## 2019-05-12 LAB — PAP IG W/ RFLX HPV ASCU

## 2019-05-12 MED ORDER — QUETIAPINE FUMARATE 25 MG PO TABS: 25.0000 mg | ORAL_TABLET | Freq: Every day | ORAL | 0 refills | Status: DC

## 2019-05-13 ENCOUNTER — Ambulatory Visit (INDEPENDENT_AMBULATORY_CARE_PROVIDER_SITE_OTHER): Payer: Medicare Other

## 2019-05-13 ENCOUNTER — Encounter: Payer: Self-pay | Admitting: *Deleted

## 2019-05-13 ENCOUNTER — Other Ambulatory Visit: Payer: Self-pay | Admitting: Obstetrics & Gynecology

## 2019-05-13 ENCOUNTER — Other Ambulatory Visit: Payer: Self-pay | Admitting: Internal Medicine

## 2019-05-13 DIAGNOSIS — Z1231 Encounter for screening mammogram for malignant neoplasm of breast: Secondary | ICD-10-CM

## 2019-05-13 DIAGNOSIS — Z13820 Encounter for screening for osteoporosis: Secondary | ICD-10-CM

## 2019-05-13 DIAGNOSIS — Z780 Asymptomatic menopausal state: Secondary | ICD-10-CM

## 2019-05-18 ENCOUNTER — Encounter: Payer: Self-pay | Admitting: Internal Medicine

## 2019-05-19 ENCOUNTER — Other Ambulatory Visit: Payer: Self-pay | Admitting: Family

## 2019-05-19 DIAGNOSIS — J439 Emphysema, unspecified: Secondary | ICD-10-CM | POA: Diagnosis not present

## 2019-05-19 DIAGNOSIS — Z9181 History of falling: Secondary | ICD-10-CM | POA: Diagnosis not present

## 2019-05-19 DIAGNOSIS — J9611 Chronic respiratory failure with hypoxia: Secondary | ICD-10-CM | POA: Diagnosis not present

## 2019-05-19 DIAGNOSIS — E785 Hyperlipidemia, unspecified: Secondary | ICD-10-CM | POA: Diagnosis not present

## 2019-05-19 DIAGNOSIS — D509 Iron deficiency anemia, unspecified: Secondary | ICD-10-CM | POA: Diagnosis not present

## 2019-05-19 DIAGNOSIS — Z96643 Presence of artificial hip joint, bilateral: Secondary | ICD-10-CM | POA: Diagnosis not present

## 2019-05-19 DIAGNOSIS — G4733 Obstructive sleep apnea (adult) (pediatric): Secondary | ICD-10-CM | POA: Diagnosis not present

## 2019-05-19 DIAGNOSIS — M150 Primary generalized (osteo)arthritis: Secondary | ICD-10-CM | POA: Diagnosis not present

## 2019-05-19 DIAGNOSIS — G8929 Other chronic pain: Secondary | ICD-10-CM | POA: Diagnosis not present

## 2019-05-19 DIAGNOSIS — I10 Essential (primary) hypertension: Secondary | ICD-10-CM | POA: Diagnosis not present

## 2019-05-19 DIAGNOSIS — Z87891 Personal history of nicotine dependence: Secondary | ICD-10-CM | POA: Diagnosis not present

## 2019-05-19 DIAGNOSIS — E039 Hypothyroidism, unspecified: Secondary | ICD-10-CM | POA: Diagnosis not present

## 2019-05-19 DIAGNOSIS — R911 Solitary pulmonary nodule: Secondary | ICD-10-CM | POA: Diagnosis not present

## 2019-05-19 DIAGNOSIS — Z7951 Long term (current) use of inhaled steroids: Secondary | ICD-10-CM | POA: Diagnosis not present

## 2019-05-19 DIAGNOSIS — K5909 Other constipation: Secondary | ICD-10-CM | POA: Diagnosis not present

## 2019-05-19 DIAGNOSIS — G4700 Insomnia, unspecified: Secondary | ICD-10-CM | POA: Diagnosis not present

## 2019-05-19 DIAGNOSIS — I2510 Atherosclerotic heart disease of native coronary artery without angina pectoris: Secondary | ICD-10-CM | POA: Diagnosis not present

## 2019-05-19 DIAGNOSIS — Z48815 Encounter for surgical aftercare following surgery on the digestive system: Secondary | ICD-10-CM | POA: Diagnosis not present

## 2019-05-19 DIAGNOSIS — E119 Type 2 diabetes mellitus without complications: Secondary | ICD-10-CM | POA: Diagnosis not present

## 2019-05-19 DIAGNOSIS — M545 Low back pain: Secondary | ICD-10-CM | POA: Diagnosis not present

## 2019-05-19 MED ORDER — CYCLOBENZAPRINE HCL 10 MG PO TABS: 10.0000 mg | ORAL_TABLET | Freq: Every day | ORAL | 0 refills | Status: DC

## 2019-05-20 ENCOUNTER — Encounter: Payer: Self-pay | Admitting: Obstetrics & Gynecology

## 2019-05-20 NOTE — Patient Instructions (Signed)
1. Encounter for routine gynecological examination with Papanicolaou smear of cervix Normal gynecologic exam.  Pap reflex done.  Breast exam normal.  Needs to schedule a screening mammogram now.  Colonoscopy 2020.  Health labs with family physician.  2. Postmenopause Postmenopausal, well on no hormone replacement therapy.  No postmenopausal bleeding.  3. Screening for osteoporosis Schedule bone density now.  Vitamin D supplements, calcium intake of 1200 mg daily and regular weightbearing physical activity is recommended. - DG Bone Density; Future  4. Class 1 obesity due to excess calories with serious comorbidity and body mass index (BMI) of 31.0 to 31.9 in adult Recommend a lower calorie/carb diet such as [employer].  Aerobic activities 5 times a week and light weightlifting every 2 days.  Joan [name], it was a pleasure seeing you today!  I will inform you of your results as soon as they are available.

## 2019-05-21 DIAGNOSIS — J441 Chronic obstructive pulmonary disease with (acute) exacerbation: Secondary | ICD-10-CM | POA: Diagnosis not present

## 2019-05-21 DIAGNOSIS — G4733 Obstructive sleep apnea (adult) (pediatric): Secondary | ICD-10-CM | POA: Diagnosis not present

## 2019-05-21 DIAGNOSIS — J449 Chronic obstructive pulmonary disease, unspecified: Secondary | ICD-10-CM | POA: Diagnosis not present

## 2019-05-23 DIAGNOSIS — J449 Chronic obstructive pulmonary disease, unspecified: Secondary | ICD-10-CM | POA: Diagnosis not present

## 2019-05-23 DIAGNOSIS — J441 Chronic obstructive pulmonary disease with (acute) exacerbation: Secondary | ICD-10-CM | POA: Diagnosis not present

## 2019-05-23 DIAGNOSIS — G4733 Obstructive sleep apnea (adult) (pediatric): Secondary | ICD-10-CM | POA: Diagnosis not present

## 2019-05-23 DIAGNOSIS — J9611 Chronic respiratory failure with hypoxia: Secondary | ICD-10-CM | POA: Diagnosis not present

## 2019-05-24 ENCOUNTER — Other Ambulatory Visit: Payer: Self-pay

## 2019-05-24 ENCOUNTER — Ambulatory Visit (INDEPENDENT_AMBULATORY_CARE_PROVIDER_SITE_OTHER): Payer: Medicare Other

## 2019-05-24 VITALS — BP 128/70 | HR 62 | Temp 98.3°F | Resp 16 | Ht 67.0 in | Wt 201.0 lb

## 2019-05-24 DIAGNOSIS — Z0000 Encounter for general adult medical examination without abnormal findings: Secondary | ICD-10-CM | POA: Diagnosis not present

## 2019-05-24 NOTE — Patient Instructions (Addendum)
Joan [name] , Thank you for taking time to come for your Medicare Wellness Visit. I appreciate your ongoing commitment to your health goals. Please review the following plan we discussed and let me know if I can assist you in the future.   Screening recommendations/referrals: Colorectal Screening: up to date (11/24/2018) Mammogram: will schedule at [place]: up to date (05/13/2019)  Vision and Dental Exams: Recommended annual ophthalmology exams for early detection of glaucoma and other disorders of the eye Recommended annual dental exams for proper oral hygiene  Vaccinations: Influenza vaccine: up to date; 11/09/2018 Pneumococcal vaccine: N/D Tdap vaccine: up to date; 12/17/2018 Shingles vaccine: will check with [facility]; Please call your insurance company to determine your out of pocket expense for the Shingrix vaccine. You may receive this vaccine at your local pharmacy. Covid vaccine: will check with [facility]  Advanced directives: Advance directives discussed with you today.  Please bring a copy of your POA (Power of [place]) and/or Living Will to your next appointment.  Goals:  Recommend to drink at least 6-8 8oz glasses of water per day.  Recommend to exercise for at least 150 minutes per week  Recommend to remove any items from the home that may cause slips or trips.  Recommend to decrease portion sizes by eating 3 small healthy meals and at least 2 healthy snacks per day.  Recommend to begin DASH diet as directed below  Next appointment: Please schedule your Annual Wellness Visit with your Nurse Health Advisor in one year.  Preventive Care 40-64 Years, Female Preventive care refers to lifestyle choices and visits with your health care provider that can promote health and wellness. What does preventive care include?  A yearly physical exam. This is also called an annual well check.  Dental exams once or twice a year.  Routine eye exams. Ask your health care  provider how often you should have your eyes checked.  Personal lifestyle choices, including:  Daily care of your teeth and gums.  Regular physical activity.  Eating a healthy diet.  Avoiding tobacco and drug use.  Limiting alcohol use.  Practicing safe sex.  Taking low-dose aspirin daily starting at age 40 if recommended by your health care provider.  Taking vitamin and mineral supplements as recommended by your health care provider. What happens during an annual well check? The services and screenings done by your health care provider during your annual well check will depend on your age, overall health, lifestyle risk factors, and family history of disease. Counseling  Your health care provider may ask you questions about your:  Alcohol use.  Tobacco use.  Drug use.  Emotional well-being.  Home and relationship well-being.  Sexual activity.  Eating habits.  Work and work Statistician.  Method of birth control.  Menstrual cycle.  Pregnancy history. Screening  You may have the following tests or measurements:  Height, weight, and BMI.  Blood pressure.  Lipid and cholesterol levels. These may be checked every 5 years, or more frequently if you are over 4 years old.  Skin check.  Lung cancer screening. You may have this screening every year starting at age 87 if you have a 30-pack-year history of smoking and currently smoke or have quit within the past 15 years.  Fecal occult blood test (FOBT) of the stool. You may have this test every year starting at age 1.  Flexible sigmoidoscopy or colonoscopy. You may have a sigmoidoscopy every 5 years or a colonoscopy every 10 years  starting at age 20.  Hepatitis C blood test.  Hepatitis B blood test.  Sexually transmitted disease (STD) testing.  Diabetes screening. This is done by checking your blood sugar (glucose) after you have not eaten for a while (fasting). You may have this done every 1-3 years.   Mammogram. This may be done every 1-2 years. Talk to your health care provider about when you should start having regular mammograms. This may depend on whether you have a family history of breast cancer.  BRCA-related cancer screening. This may be done if you have a family history of breast, ovarian, tubal, or peritoneal cancers.  Pelvic exam and Pap test. This may be done every 3 years starting at age 88. Starting at age 40, this may be done every 5 years if you have a Pap test in combination with an HPV test.  Bone density scan. This is done to screen for osteoporosis. You may have this scan if you are at high risk for osteoporosis. Discuss your test results, treatment options, and if necessary, the need for more tests with your health care provider. Vaccines  Your health care provider may recommend certain vaccines, such as:  Influenza vaccine. This is recommended every year.  Tetanus, diphtheria, and acellular pertussis (Tdap, Td) vaccine. You may need a Td booster every 10 years.  Zoster vaccine. You may need this after age 43.  Pneumococcal 13-valent conjugate (PCV13) vaccine. You may need this if you have certain conditions and were not previously vaccinated.  Pneumococcal polysaccharide (PPSV23) vaccine. You may need one or two doses if you smoke cigarettes or if you have certain conditions. Talk to your health care provider about which screenings and vaccines you need and how often you need them. This information is not intended to replace advice given to you by your health care provider. Make sure you discuss any questions you have with your health care provider. Document Released: 03/10/2015 Document Revised: 11/01/2015 Document Reviewed: 12/13/2014 Elsevier Interactive Patient Education  2017 [employer] Prevention in the Home Falls can cause injuries. They can happen to people of all ages. There are many things you can do to make your home safe and to help prevent  falls. What can I do on the outside of my home?  Regularly fix the edges of walkways and driveways and fix any cracks.  Remove anything that might make you trip as you walk through a door, such as a raised step or threshold.  Trim any bushes or trees on the path to your home.  Use bright outdoor lighting.  Clear any walking paths of anything that might make someone trip, such as rocks or tools.  Regularly check to see if handrails are loose or broken. Make sure that both sides of any steps have handrails.  Any raised decks and porches should have guardrails on the edges.  Have any leaves, snow, or ice cleared regularly.  Use sand or salt on walking paths during winter.  Clean up any spills in your garage right away. This includes oil or grease spills. What can I do in the bathroom?  Use night lights.  Install grab bars by the toilet and in the tub and shower. Do not use towel bars as grab bars.  Use non-skid mats or decals in the tub or shower.  If you need to sit down in the shower, use a plastic, non-slip stool.  Keep the floor dry. Clean up any water that spills on the  floor as soon as it happens.  Remove soap buildup in the tub or shower regularly.  Attach bath mats securely with double-sided non-slip rug tape.  Do not have throw rugs and other things on the floor that can make you trip. What can I do in the bedroom?  Use night lights.  Make sure that you have a light by your bed that is easy to reach.  Do not use any sheets or blankets that are too big for your bed. They should not hang down onto the floor.  Have a firm chair that has side arms. You can use this for support while you get dressed.  Do not have throw rugs and other things on the floor that can make you trip. What can I do in the kitchen?  Clean up any spills right away.  Avoid walking on wet floors.  Keep items that you use a lot in easy-to-reach places.  If you need to reach something  above you, use a strong step stool that has a grab bar.  Keep electrical cords out of the way.  Do not use floor polish or wax that makes floors slippery. If you must use wax, use non-skid floor wax.  Do not have throw rugs and other things on the floor that can make you trip. What can I do with my stairs?  Do not leave any items on the stairs.  Make sure that there are handrails on both sides of the stairs and use them. Fix handrails that are broken or loose. Make sure that handrails are as long as the stairways.  Check any carpeting to make sure that it is firmly attached to the stairs. Fix any carpet that is loose or worn.  Avoid having throw rugs at the top or bottom of the stairs. If you do have throw rugs, attach them to the floor with carpet tape.  Make sure that you have a light switch at the top of the stairs and the bottom of the stairs. If you do not have them, ask someone to add them for you. What else can I do to help prevent falls?  Wear shoes that:  Do not have high heels.  Have rubber bottoms.  Are comfortable and fit you well.  Are closed at the toe. Do not wear sandals.  If you use a stepladder:  Make sure that it is fully opened. Do not climb a closed stepladder.  Make sure that both sides of the stepladder are locked into place.  Ask someone to hold it for you, if possible.  Clearly mark and make sure that you can see:  Any grab bars or handrails.  First and last steps.  Where the edge of each step is.  Use tools that help you move around (mobility aids) if they are needed. These include:  Canes.  Walkers.  Scooters.  Crutches.  Turn on the lights when you go into a dark area. Replace any light bulbs as soon as they burn out.  Set up your furniture so you have a clear path. Avoid moving your furniture around.  If any of your floors are uneven, fix them.  If there are any pets around you, be aware of where they are.  Review your  medicines with your doctor. Some medicines can make you feel dizzy. This can increase your chance of falling. Ask your doctor what other things that you can do to help prevent falls. This information is not intended to replace  advice given to you by your health care provider. Make sure you discuss any questions you have with your health care provider. Document Released: 12/08/2008 Document Revised: 07/20/2015 Document Reviewed: 03/18/2014 Elsevier Interactive Patient Education  2017 [employer].

## 2019-05-24 NOTE — Progress Notes (Addendum)
Subjective:   Joan [name] is a 59 y.o. female who presents for Medicare Annual (Subsequent) preventive examination.  Review of Systems:  No ROS Medicare Wellness Visit Cardiac Risk Factors include: hypertension;dyslipidemia;obesity (BMI >30kg/m2)  Sleep patterns: no trouble falling asleep Home Safety/Smoke Alarms: Feels safe in her home. Smoke alarms in place.     Objective:     Vitals: BP 128/70 (BP Location: Left Arm, Patient Position: Sitting, Cuff Size: Normal)   Pulse 62   Temp 98.3 F (36.8 C)   Resp 16   Ht 5\' 7"  (1.702 m)   Wt 201 lb (91.2 kg)   SpO2 95%   BMI 31.48 kg/m   Body mass index is 31.48 kg/m.  Advanced Directives 05/24/2019 03/30/2019 03/30/2019 02/13/2019 01/26/2019 01/20/2019 11/24/2018  Does Patient Have a Medical Advance Directive? No Yes Yes Yes Yes Yes Yes  Type of Advance Directive - [place];Living will [place];Living will Living will Living will;Healthcare Power of [place];Living will  Does patient want to make changes to medical advance directive? - No - Patient declined - No - Patient declined No - Patient declined No - Patient declined -  Copy of [place] in Chart? - No - copy requested - No - copy requested No - copy requested No - copy requested No - copy requested  Would patient like information on creating a medical advance directive? No - Patient declined No - Patient declined No - Patient declined - - - -    Tobacco Social History   Tobacco Use  Smoking Status Former Smoker  . Packs/day: 1.00  . Years: 44.00  . Pack years: 44.00  . Types: Cigarettes  . Quit date: 11/30/2018  . Years since quitting: 0.4  Smokeless Tobacco Never Used  Tobacco Comment   Smoker 1-1.5 PPD every day, 11-20-2018 "i got myself down to half a pack", i am gonna be going on chantix on the 28th"     Counseling given: No Comment:  Smoker 1-1.5 PPD every day, 11-20-2018 "i got myself down to half a pack", i am gonna be going on chantix on the 28th"   Clinical Intake:  Pre-visit preparation completed: Yes  Pain : No/denies pain Pain Score: 0-No pain     Diabetes: No  How often do you need to have someone help you when you read instructions, pamphlets, or other written materials from your doctor or pharmacy?: 1 - Never What is the last grade level you completed in school?: 9th grade  Interpreter Needed?: No  Information entered by :: Brighton Delio N. Lowell Guitar, LPN  Past Medical History:  Diagnosis Date  . Acute respiratory failure with hypoxia ([place])   . Alcoholism and drug addiction in family   . Anxiety   . Arthritis    "knees, hips, lower back" (09/25/2016)  . Asthma   . Bipolar disorder ([place])   . Chronic bronchitis ([place])    "all the time" (09/25/2016)  . Chronic leg pain    RLE  . Chronic lower back pain   . COPD (chronic obstructive pulmonary disease) ([place])   . Depression   . Diabetes mellitus without complication ([place])   . Emphysema of lung ([place])   . GERD (gastroesophageal reflux disease)   . Headache   . Hepatitis C    "treated back in 2001-2002"  . High cholesterol   . Hypertension   . Hypothyroidism   .  Incarcerated ventral hernia 04/04/2017  . On home oxygen therapy    "2L; at nighttime" (04/04/2017)  . OSA on CPAP    CPAP with Oxygen 2 liters  . Pneumonia    "all the time" (09/25/2016)  . Positive PPD    with negative chest x ray  . Tachycardia    patient reports with exertion ; denies any other conditions   . Thyroid disease    Past Surgical History:  Procedure Laterality Date  . BACK SURGERY    . BIOPSY  11/24/2018   Procedure: BIOPSY;  Surgeon: Thornton Park, MD;  [facility]: [facility];  Service: Gastroenterology;;  . CARDIAC CATHETERIZATION    . COLONOSCOPY    . COLONOSCOPY WITH PROPOFOL N/A 11/24/2018   Procedure: COLONOSCOPY WITH PROPOFOL;  Surgeon: Thornton Park, MD;   [facility];  Service: Gastroenterology;  Laterality: N/A;  . CYST EXCISION     removal of cyst in sinuses  . DILATION AND CURETTAGE OF UTERUS    . ESOPHAGOGASTRODUODENOSCOPY (EGD) WITH PROPOFOL N/A 11/24/2018   Procedure: ESOPHAGOGASTRODUODENOSCOPY (EGD) WITH PROPOFOL;  Surgeon: Thornton Park, MD;  [facility];  Service: Gastroenterology;  Laterality: N/A;  . FOOT SURGERY Bilateral    bone removed from 4th and 5 th toes and put in pin then took pin out  . HERNIA REPAIR    . INCISION AND DRAINAGE  ~ 2014/2015   "removed mesh & infection"  . INCISIONAL HERNIA REPAIR N/A 04/04/2017   Procedure: LAPAROSCOPIC INCISIONAL HERNIA REPAIR WITH MESH;  Surgeon: Fanny Skates, MD;  Location: [facility];  Service: General;  Laterality: N/A;  . LACERATION REPAIR Right    repair wrist artery from laceration from ice skate  . LAPAROSCOPIC APPENDECTOMY N/A 03/31/2019   Procedure: APPENDECTOMY LAPAROSCOPIC;  Surgeon: Ralene Ok, MD;  [facility];  Service: General;  Laterality: N/A;  . LAPAROSCOPIC INCISIONAL / UMBILICAL / VENTRAL HERNIA REPAIR  04/04/2017   LAPAROSCOPIC INCISIONAL HERNIA REPAIR WITH MESH  . LIPOMA EXCISION Right    fattty lipoma in neck  . NASAL SEPTUM SURGERY    . POSTERIOR LUMBAR FUSION  2015/2016 X 2   "added rods and screws"  . TOTAL HIP ARTHROPLASTY Right 02/05/2017   Procedure: RIGHT TOTAL HIP ARTHROPLASTY;  Surgeon: [name], MD;  [facility];  Service: Orthopedics;  Laterality: Right;  . TOTAL HIP ARTHROPLASTY Left 06/18/2017   Procedure: LEFT TOTAL HIP ARTHROPLASTY;  Surgeon: [name], MD;  [facility];  Service: Orthopedics;  Laterality: Left;  . TOTAL HIP REVISION Left 01/26/2019   Procedure: TOTAL HIP REVISION;  Surgeon: Paralee Cancel, MD;  [facility];  Service: Orthopedics;  Laterality: Left;  2 hrs  . TUBAL LIGATION    . UMBILICAL HERNIA REPAIR  ~ 2013   w/mesh  . UPPER GI ENDOSCOPY     Family History  Problem  Relation Age of Onset  . Diabetes Mother   . Hypertension Mother   . Hypertension Father   . Cancer Father        lung   Social History   Socioeconomic History  . Marital status: Widowed    Spouse name: Not on file  . Number of children: 3  . Years of education: Not on file  . Highest education level: Not on file  Occupational History  . Not on file  Tobacco Use  . Smoking status: Former Smoker    Packs/day: 1.00    Years: 44.00    Pack years: 44.00  Types: Cigarettes    Quit date: 11/30/2018    Years since quitting: 0.4  . Smokeless tobacco: Never Used  . Tobacco comment: Smoker 1-1.5 PPD every day, 11-20-2018 "i got myself down to half a pack", i am gonna be going on chantix on the 28th"  Substance and Sexual Activity  . Alcohol use: Not Currently  . Drug use: Not Currently    Types: "Crack" cocaine    Comment: 04/04/2017 "nothing since 01/12/2012"  . Sexual activity: Never    Partners: Male  Other Topics Concern  . Not on file  Social History Narrative  . Not on file   Social Determinants of Health   Financial Resource Strain:   . Difficulty of Paying Living Expenses:   Food Insecurity:   . Worried About Charity fundraiser in the Last Year:   . Arboriculturist in the Last Year:   Transportation Needs:   . Film/video editor (Medical):   Marland Kitchen Lack of Transportation (Non-Medical):   Physical Activity:   . Days of Exercise per Week:   . Minutes of Exercise per Session:   Stress:   . Feeling of Stress :   Social Connections:   . Frequency of Communication with Friends and Family:   . Frequency of Social Gatherings with Friends and Family:   . Attends Religious Services:   . Active Member of Clubs or Organizations:   . Attends Archivist Meetings:   Marland Kitchen Marital Status:     Outpatient Encounter Medications as of 05/24/2019  Medication Sig  . acetaminophen (TYLENOL) 500 MG tablet Take 2 tablets (1,000 mg total) by mouth every 8 (eight) hours as  needed for mild pain.  Marland Kitchen acyclovir (ZOVIRAX) 400 MG tablet Take 1 tablet (400 mg total) by mouth 2 (two) times daily. (Patient taking differently: Take 400 mg by mouth daily. )  . albuterol (PROVENTIL) (2.5 MG/3ML) 0.083% nebulizer solution Take 3 mLs (2.5 mg total) by nebulization 2 (two) times daily. And every 6 hours as needed.  DX: J44.9 (Patient taking differently: Take 2.5 mg by nebulization 3 (three) times daily as needed for wheezing or shortness of breath (DX: J44.9). )  . atenolol (TENORMIN) 25 MG tablet TAKE 1 TABLET BY MOUTH  DAILY (Patient taking differently: Take 25 mg by mouth daily. )  . atorvastatin (LIPITOR) 20 MG tablet TAKE 1 TABLET BY MOUTH  DAILY (Patient taking differently: Take 20 mg by mouth daily. )  . budesonide-formoterol (SYMBICORT) 160-4.5 MCG/ACT inhaler Inhale 2 puffs into the lungs 2 (two) times daily.  Marland Kitchen buPROPion (WELLBUTRIN XL) 150 MG 24 hr tablet Take 1 tablet (150 mg total) by mouth 2 (two) times daily.  Marland Kitchen buPROPion (WELLBUTRIN XL) 300 MG 24 hr tablet Take 300 mg by mouth daily.  . cyclobenzaprine (FLEXERIL) 10 MG tablet Take 1 tablet (10 mg total) by mouth at bedtime.  . diphenhydrAMINE (BENADRYL) 25 MG tablet Take 25 mg by mouth 2 (two) times daily.   Marland Kitchen docusate sodium (COLACE) 100 MG capsule Take 1 capsule (100 mg total) by mouth 2 (two) times daily.  Marland Kitchen gabapentin (NEURONTIN) 300 MG capsule TAKE 1 CAPSULE BY MOUTH  TWICE DAILY (Patient taking differently: Take 600 mg by mouth at bedtime. )  . hydrochlorothiazide (HYDRODIURIL) 25 MG tablet TAKE 1 TABLET BY MOUTH  DAILY (Patient taking differently: Take 25 mg by mouth daily. )  . Ipratropium-Albuterol (COMBIVENT RESPIMAT) 20-100 MCG/ACT AERS respimat Inhale 1 puff into the lungs every 6 (  six) hours as needed for wheezing. (Patient taking differently: Inhale 2 puffs into the lungs in the morning and at bedtime. )  . levothyroxine (SYNTHROID) 25 MCG tablet TAKE 1 TABLET BY MOUTH  DAILY BEFORE BREAKFAST (Patient  taking differently: Take 25 mcg by mouth daily before breakfast. )  . Liniments (SALONPAS ARTHRITIS PAIN RELIEF EX) Apply 1 patch topically daily as needed (to painful sites- remove as directed).   . Melatonin 10 MG TABS Take 10 mg by mouth at bedtime.  . montelukast (SINGULAIR) 10 MG tablet TAKE 1 TABLET BY MOUTH AT  BEDTIME (Patient taking differently: Take 10 mg by mouth at bedtime. )  . Multiple Vitamins-Minerals (ADULT ONE DAILY GUMMIES) CHEW Chew 1 tablet by mouth daily.   . naproxen (NAPROSYN) 500 MG tablet Take 500 mg by mouth in the morning and at bedtime.   Marland Kitchen nystatin-triamcinolone ointment (MYCOLOG) Apply 1 application topically 2 (two) times daily as needed (applied to skin folds/groins/under breast skin irritation rash.). (Patient taking differently: Apply 1 application topically 2 (two) times daily as needed (to skin folds/groin/under breast skin- for irritation/rashes). )  . OXYGEN Inhale 2-3 L/min into the lungs See admin instructions. Inhale  2-3 L/min of oxygen into the lungs w/CPAP at bedtime and as needed for shortness of breath with "strenuous activity" during the day  . pantoprazole (PROTONIX) 40 MG tablet TAKE 1 TABLET BY MOUTH  DAILY (Patient taking differently: Take 40 mg by mouth daily. )  . polyethylene glycol (MIRALAX / GLYCOLAX) 17 g packet Take 17 g by mouth daily as needed for mild constipation. (Patient taking differently: Take 17 g by mouth in the morning, at noon, and at bedtime. )  . QUEtiapine (SEROQUEL) 25 MG tablet Take 1 tablet (25 mg total) by mouth at bedtime. For now has 50mg  can take half at night.  . roflumilast (DALIRESP) 500 MCG TABS tablet Take 1 tablet (500 mcg total) by mouth daily.  . traZODone (DESYREL) 100 MG tablet TAKE 1 TABLET BY MOUTH AT  BEDTIME (Patient taking differently: Take 100 mg by mouth at bedtime. )  . venlafaxine XR (EFFEXOR-XR) 150 MG 24 hr capsule TAKE 1 CAPSULE BY MOUTH  DAILY WITH BREAKFAST (Patient taking differently: Take 150 mg by  mouth daily with breakfast. )   No facility-administered encounter medications on file as of 05/24/2019.    Activities of Daily Living In your present state of health, do you have any difficulty performing the following activities: 05/24/2019 03/30/2019  Hearing? N -  Vision? N -  Difficulty concentrating or making decisions? Y -  Walking or climbing stairs? Y -  Comment - -  Dressing or bathing? N -  Doing errands, shopping? N Y  Comment she does not drive to her appts or to run errands Facilities manager and eating ? N -  Using the Toilet? N -  In the past six months, have you accidently leaked urine? N -  Do you have problems with loss of bowel control? N -  Managing your Medications? N -  Managing your Finances? N -  Housekeeping or managing your Housekeeping? N -  Comment - -  Some recent data might be hidden    Patient Care Team: [name], MD as PCP - General (Internal Medicine) Thornton Park, MD as Consulting Physician (Gastroenterology) Gevena Cotton, MD as Consulting Physician (Ophthalmology) Paralee Cancel, MD as Consulting Physician (Orthopedic Surgery)    Assessment:   This is a routine wellness examination for  Joan [name].  Exercise Activities and Dietary recommendations Current Exercise Habits: The patient does not participate in regular exercise at present, Exercise limited by: orthopedic condition(s);psychological condition(s);respiratory conditions(s)  Goals    . Patient Stated     Get involved socially with the senior centers.        Fall Risk Fall Risk  05/24/2019 12/18/2018 04/06/2018  Falls in the past year? 0 0 0  Number falls in past yr: 0 0 -  Injury with Fall? 0 0 -  Risk for fall due to : No Fall Risks Impaired balance/gait;Impaired mobility Impaired balance/gait;Impaired mobility  Follow up Falls evaluation completed;Education provided;Falls prevention discussed Falls evaluation completed Falls prevention discussed   Is the  patient's home free of loose throw rugs in walkways, pet beds, electrical cords, etc?   yes      Grab bars in the bathroom? no      Handrails on the stairs?   yes      Adequate lighting?   yes  Timed Get Up and Go performed:   Depression Screen PHQ 2/9 Scores 05/24/2019 12/18/2018 11/18/2018 04/06/2018  PHQ - 2 Score 2 3 1 2   PHQ- 9 Score 13 - - 6     Cognitive Function MMSE - Mini Mental State Exam 04/06/2018  Orientation to time 5  Orientation to Place 5  Registration 3  Attention/ Calculation 4  Recall 3  Language- name 2 objects 2  Language- repeat 1  Language- follow 3 step command 3  Language- read & follow direction 1  Write a sentence 1  Copy design 1  Total score 29     6CIT Screen 05/24/2019  What Year? 0 points  What month? 0 points  What time? 0 points  Count back from 20 0 points  Months in reverse 0 points  Repeat phrase 0 points  Total Score 0    Immunization History  Administered Date(s) Administered  . Influenza,inj,Quad PF,6+ Mos 11/25/2016, 11/11/2017, 11/09/2018  . Tdap 12/17/2018    Qualifies for Shingles Vaccine? Will check with her pharmacy   Screening Tests Health Maintenance  Topic Date Due  . Hepatitis C Screening  Never done  . PAP SMEAR-Modifier  Never done  . MAMMOGRAM  Never done  . URINE MICROALBUMIN  12/17/2019  . COLONOSCOPY  11/23/2028  . TETANUS/TDAP  12/16/2028  . INFLUENZA VACCINE  Completed  . HIV Screening  Completed    Cancer Screenings: Lung: Low Dose CT Chest recommended if Age 58-80 years, 30 pack-year currently smoking OR have quit w/in 15years. Patient does qualify. Breast:  Up to date on Mammogram? No, has been scheduled at [facility] Up to date of Bone Density/Dexa? Yes Colorectal: up to date (11/24/2018)     Plan:    I have personally reviewed and noted the following in the patient's chart:   . Medical and social history . Use of alcohol, tobacco or illicit drugs  . Current medications and  supplements . Functional ability and status . Nutritional status . Physical activity . Advanced directives . List of other physicians . Hospitalizations, surgeries, and ER visits in previous 12 months . Vitals . Screenings to include cognitive, depression, and falls . Referrals and appointments  In addition, I have reviewed and discussed with patient certain preventive protocols, quality metrics, and best practice recommendations. A written personalized care plan for preventive services as well as general preventive health recommendations were provided to patient.     Sheral Flow, LPN  [id number]  Nurse Health Advisor

## 2019-05-25 ENCOUNTER — Telehealth: Payer: Self-pay | Admitting: Pharmacist

## 2019-05-25 ENCOUNTER — Other Ambulatory Visit: Payer: Self-pay | Admitting: *Deleted

## 2019-05-25 DIAGNOSIS — J449 Chronic obstructive pulmonary disease, unspecified: Secondary | ICD-10-CM

## 2019-05-25 NOTE — Telephone Encounter (Signed)
Called patient on 05/25/2019 at 3:26 PM   Patient called [place] team regarding concerns about Combivent prescription. She is about to run out of Combivent. She is unsure which medications she receives through patient assistance from drug manufacturers.  Explained to patient that [place] team assisted in setting her up to receive Daliresp and Symbicort via [employer] for 2020 year. Also renewed her applications for those medications for 2021 year. We did not assist in helping with cost for Combivent or albuterol. It appears Combivent may be attainable via [employer]. As [place] teams responsibilities have evolved, we will be unable to assist with patient assistance programs for inhaler medications in the future as we will be transitioning to assisting with specialty pharmacy medications. Patient will need help with PAP for Daliresp and Symbicort for 2022 year and future.  Patient was set up with [facility] in the past, however, appears to be lost to follow up when in the hospital. Patient was extremely upset about this. She confirms she would like to be set up with [facility] services (specifically pharmacy) again to assist with costs of medications. Will put in referral for patient.  Thank you for involving pharmacy to assist in providing this patient's care.   Drexel Iha, PharmD PGY2 Ambulatory Care Pharmacy Resident

## 2019-05-25 NOTE — Patient Outreach (Signed)
[facility]) Care Management  05/25/2019  TERRENA GARCED [birth year] [id number]   Voice message received from member after multiple missed calls and case closure letter was sent. Call placed to member, identity verified.  She immediately begin to express concern regarding ability to afford medications for her COPD.  State she has been in contact with the pharmacy team at the pulmonology clinic regarding the issue and was told to contact [facility].  Admits that she had not been answering the calls because she didn't feel like talking.  She now state she is feeling better since her appendectomy, report her only concern now is to be able to pay for her medications.  Denies any nursing needs at this time.  State she lives with her brother who has been providing support for her over the last several years.    Will not open to nursing but will place referral to pharmacy team.  Advised member to answer phone when outreach is made, if unable to do so, encouraged to call back.  Valente David, [place], MSN [place] [phone]

## 2019-05-27 ENCOUNTER — Other Ambulatory Visit: Payer: Self-pay | Admitting: General Surgery

## 2019-05-27 DIAGNOSIS — K432 Incisional hernia without obstruction or gangrene: Secondary | ICD-10-CM

## 2019-05-27 DIAGNOSIS — Z9049 Acquired absence of other specified parts of digestive tract: Secondary | ICD-10-CM

## 2019-05-28 ENCOUNTER — Ambulatory Visit (INDEPENDENT_AMBULATORY_CARE_PROVIDER_SITE_OTHER): Payer: Medicare Other | Admitting: Licensed Clinical Social Worker

## 2019-05-28 DIAGNOSIS — R443 Hallucinations, unspecified: Secondary | ICD-10-CM

## 2019-05-28 DIAGNOSIS — F4312 Post-traumatic stress disorder, chronic: Secondary | ICD-10-CM

## 2019-05-28 DIAGNOSIS — F411 Generalized anxiety disorder: Secondary | ICD-10-CM

## 2019-05-28 DIAGNOSIS — F0630 Mood disorder due to known physiological condition, unspecified: Secondary | ICD-10-CM | POA: Diagnosis not present

## 2019-05-28 NOTE — Progress Notes (Signed)
Virtual Visit via Telephone Note  I connected with Joan [name] on 05/28/19 at 10:00 AM EDT by telephone and verified that I am speaking with the correct person using two identifiers.   I discussed the limitations, risks, security and privacy concerns of performing an evaluation and management service by telephone and the availability of in person appointments. I also discussed with the patient that there may be a patient responsible charge related to this service. The patient expressed understanding and agreed to proceed.   I discussed the assessment and treatment plan with the patient. The patient was provided an opportunity to ask questions and all were answered. The patient agreed with the plan and demonstrated an understanding of the instructions.   The patient was advised to call back or seek an in-person evaluation if the symptoms worsen or if the condition fails to improve as anticipated.  I provided 16 minutes of non-face-to-face time during this encounter.  THERAPIST PROGRESS NOTE  Session Time: 10:00 AM to 10:16 AM  Participation Level: Active  Behavioral Response: CasualAlertDysphoric  Type of Therapy: Individual Therapy  Treatment Goals addressed: PTSD, coping, hallucinations  Interventions: Solution Focused, Strength-based, Supportive and Other: coping  Summary: Joan [name] is a 60 y.o. female who presents with awful stomach hurts. Still a piece of appendix in there have to take out didn't take out because they were going to take too much colon. Last time it was too infected, took what they could. CAT scan this Monday. See doctor 06/24/19. Talk about the surgery. Hope that he takes her right away after the CAT scan. Something bothering her it hurts to breath. Started bothering her Tuesday night. Wednesday all day and night, Thursday same thing and Friday same thing. In bed and trying to stay asleep so don't feel it. Trying to rest. Wanted to end session early because in pain  and wants to sleep. Therapist provided strength based and supportive intervention. Encouraged patient to get to hospital to take care of pain as soon as possible. Therapist access supportive intervention helpful as she continues to deal with medical issues.   Suicidal/Homicidal: No  Plan: Return again in 2 weeks.2.Therapist work with patient on mood regulation, stress management supportive interventions as patient continues to deal with medical issues  Diagnosis: Axis I: PTSD, hallucinations, GAD, Mood Disorder in conditions classified elsewhere    Axis II: No diagnosis    [name], LCSW 05/28/2019

## 2019-05-31 ENCOUNTER — Telehealth: Payer: Self-pay | Admitting: Pharmacist

## 2019-05-31 ENCOUNTER — Ambulatory Visit
Admission: RE | Admit: 2019-05-31 | Discharge: 2019-05-31 | Disposition: A | Discharge status: Home / Self Care | Payer: Medicare Other | Source: Ambulatory Visit | Attending: General Surgery | Admitting: General Surgery

## 2019-05-31 DIAGNOSIS — Z9049 Acquired absence of other specified parts of digestive tract: Secondary | ICD-10-CM

## 2019-05-31 DIAGNOSIS — I779 Disorder of arteries and arterioles, unspecified: Secondary | ICD-10-CM

## 2019-05-31 DIAGNOSIS — K432 Incisional hernia without obstruction or gangrene: Secondary | ICD-10-CM

## 2019-05-31 DIAGNOSIS — K429 Umbilical hernia without obstruction or gangrene: Secondary | ICD-10-CM | POA: Diagnosis not present

## 2019-05-31 MED ORDER — IOPAMIDOL (ISOVUE-300) INJECTION 61%
100.0000 mL | Freq: Once | INTRAVENOUS | Status: AC | PRN
  Administered 2019-05-31: 100 mL via INTRAVENOUS

## 2019-05-31 NOTE — Telephone Encounter (Signed)
  Chronic Care Management   Outreach Note  05/31/2019 Name: Joan [name] MRN: [id number] DOB: 04/14/1960  Referred by: [name], MD Reason for referral : Chronic Care Management  An unsuccessful telephone outreach was attempted today. The patient was referred to the pharmacist for assistance with care management and care coordination.   Follow Up Plan: left message for patient to call pharmacist directly to set up initial appointment.   Charlene Brooke, PharmD Clinical Pharmacist [facility] at [facility] [phone]

## 2019-06-02 NOTE — Telephone Encounter (Signed)
    Chronic Care Management   Note  06/02/2019 Name: Joan [name] MRN: [id number] DOB: 08/07/1960  GIA SEES is a 59 y.o. year old female who is a primary care patient of [name], MD. I reached out to [name] by phone today in response to a referral sent by [name]'s PCP, [name], MD.   Joan [name] was given information about Chronic Care Management services today including:  1. CCM service includes personalized support from designated clinical staff supervised by her physician, including individualized plan of care and coordination with other care providers 2. 24/7 contact phone numbers for assistance for urgent and routine care needs. 3. Service will only be billed when office clinical staff spend 20 minutes or more in a month to coordinate care. 4. Only one practitioner may furnish and bill the service in a calendar month. 5. The patient may stop CCM services at any time (effective at the end of the month) by phone call to the office staff.  Patient agreed to services and verbal consent obtained.     We also discussed Combivent, patient reports she was getting this through patient assistance last year. Previously the pharmacy team at [facility] was taking care of [place] for inhalers, but as of last month they have discontinued this service and referred to [facility] for medication assistance. CCM pharmacist will now take over patient assistance.   Follow up plan: scheduled initial visit with pharmacist  Charlene Brooke, PharmD Clinical Pharmacist [facility] at [facility] [phone]

## 2019-06-04 ENCOUNTER — Other Ambulatory Visit: Payer: Self-pay | Admitting: Internal Medicine

## 2019-06-07 ENCOUNTER — Telehealth (HOSPITAL_COMMUNITY): Payer: Self-pay

## 2019-06-07 MED ORDER — QUETIAPINE FUMARATE 25 MG PO TABS: 25.0000 mg | ORAL_TABLET | Freq: Every day | ORAL | 0 refills | Status: DC

## 2019-06-07 NOTE — Telephone Encounter (Signed)
Patient uses [facility] for all permanent medications. She would like quetiapine sent to [facility] for now on. I sent the 90 day supply to [facility]

## 2019-06-08 ENCOUNTER — Ambulatory Visit
Admission: RE | Admit: 2019-06-08 | Discharge: 2019-06-08 | Disposition: A | Discharge status: Home / Self Care | Payer: Medicare Other | Source: Ambulatory Visit | Attending: Internal Medicine | Admitting: Internal Medicine

## 2019-06-08 ENCOUNTER — Other Ambulatory Visit: Payer: Self-pay

## 2019-06-08 DIAGNOSIS — Z1231 Encounter for screening mammogram for malignant neoplasm of breast: Secondary | ICD-10-CM

## 2019-06-09 DIAGNOSIS — J9611 Chronic respiratory failure with hypoxia: Secondary | ICD-10-CM | POA: Diagnosis not present

## 2019-06-09 DIAGNOSIS — J449 Chronic obstructive pulmonary disease, unspecified: Secondary | ICD-10-CM | POA: Diagnosis not present

## 2019-06-09 DIAGNOSIS — J441 Chronic obstructive pulmonary disease with (acute) exacerbation: Secondary | ICD-10-CM | POA: Diagnosis not present

## 2019-06-11 ENCOUNTER — Ambulatory Visit (INDEPENDENT_AMBULATORY_CARE_PROVIDER_SITE_OTHER): Payer: Medicare Other | Admitting: Licensed Clinical Social Worker

## 2019-06-11 DIAGNOSIS — F0630 Mood disorder due to known physiological condition, unspecified: Secondary | ICD-10-CM

## 2019-06-11 DIAGNOSIS — F411 Generalized anxiety disorder: Secondary | ICD-10-CM

## 2019-06-11 DIAGNOSIS — F4312 Post-traumatic stress disorder, chronic: Secondary | ICD-10-CM

## 2019-06-11 DIAGNOSIS — R443 Hallucinations, unspecified: Secondary | ICD-10-CM

## 2019-06-11 NOTE — Progress Notes (Signed)
Virtual Visit via Telephone Note  I connected with Joan [name] on 06/11/19 at 10:00 AM EDT by telephone and verified that I am speaking with the correct person using two identifiers.   I discussed the limitations, risks, security and privacy concerns of performing an evaluation and management service by telephone and the availability of in person appointments. I also discussed with the patient that there may be a patient responsible charge related to this service. The patient expressed understanding and agreed to proceed.   I discussed the assessment and treatment plan with the patient. The patient was provided an opportunity to ask questions and all were answered. The patient agreed with the plan and demonstrated an understanding of the instructions.   The patient was advised to call back or seek an in-person evaluation if the symptoms worsen or if the condition fails to improve as anticipated.  I provided 55 minutes of non-face-to-face time during this encounter.   THERAPIST PROGRESS NOTE  Session Time: 10:00 AM to 10:55 AM  Participation Level: Active  Behavioral Response: CasualAlertEuthymic  Type of Therapy: Individual Therapy  Treatment Goals addressed: PTSD, coping, hallucinations  Interventions: Solution Focused, Strength-based, Supportive and Other: substance abuse counseling, stress management, coping  Summary: Joan [name] is a 59 y.o. female who presents with still having pains on right side. Pain a lot better from before. But pains are getting stronger again. She can't go to the bathroom. Taking medicine to help go to the bathroom. Doing two medications and still has problems going to the bathroom. Says having a blockage. Wants to know if going to fix that or is it going to have another surgery. Discussed has been constant dealing wiht medical issues from end of last year to now. Hope address the blockage to help with the pain she is having now. When laugh it  hurts. Hoping when take appendix will feel better and fix blockage at the same time. Pain of blockage the same as had for appendicitis. Very uncomfortable. "I don't have a day." Do a chore and then sit will take her a long time to even dust a room. That has to do with lungs as well. Thinks has put on weight and need to go on diet. Making it harder to breath. Has a lung condition. Have a hard time breathing just being big and patient has that besides a lung condition. Before would walk up and down the hall and gasping for air, walking from living room, to kitchen to sit on the patio. Just to get there would have to stop to catch her breath. She would light a cigarette. Quit smoking since October. Has urge but prevented because would have to buy a pack, urge but not the need.  What got her to quit was she had to have surgery. Hip surgery extended the left hip wouldn't do it until she stopped for 4 weeks, patient wanting to go 8 weeks. Brother working and likes the quiet time for herself. Likes being alone and didn't like it before. Used to be afraid and didn't want to be alone. Doctor brought the Seroquel down seeing bugs. See shadows and thinks they are real because seeing for awhile and see them with the medication. If they weren't real the medication would have taken them away. People don't believe her but she is ok with it. Accepting it and it is what it is. Feels bump into something, sees a shadow and then focuses more and moves on. Not hurting anyone.  Last couple years of using crack, got very paranoid. Thought she saw things when moved out the window like cops flashing light on her. Kids want her to stay here because doesn't use anything. Doesn't want to go to recovery meetings. Doesn't want to meet people that use. Leads a relapse.   Hallucinations horrible smoking crack wake up and keep going for three day, wouldn't stop until out of resources to get anything again. Sometimes a week not sleeping because  getting high. It was vicous cycle and never thought get out of there, "I am stuck in it and will kill me". Grateful for being off of crack and smoking. Not good for her to go to old neighborhoods. Seeing doctor on 06/24/19. After CAT scan didn't call assuming that went well because they didn't call her. Reviewed that February 1 rushed to hospital. Related to recent pains called doctor and doctor said could take six to 8 weeks for it to improve. Wants surgery to get it out of her. Reviewed patient being the youngest of 51 brothers and sisters and was spoiled by brothers and sister, also the rebel. Reviewed session and patient said talked about addiction and therapist pointed out patient made good choices, discussed her shadows, medical issues and managing the stressor, therapist added supportive interventions and noting positive changes in her life.   Suicidal/Homicidal: No  Therapist Response: Therapist reviewed symptoms, provided substance abuse intervention first by utilizing motivational type strategies to give positive feedback for patient's major steps forward in stopping using crack, and smoking pointing out the many benefits she will gain from that, proving her health and quality of her life.  Pointing out the positive aspects to encourage continued motivation.  Reviewed insights to help patient in moving forward in her recovery by not putting herself into risky situations such as old neighborhoods, even going to recovery groups as a trigger for relapse as she will meet people who use that put her at risk to use again.  Here in [place] she does not know anybody which provides a positive environment patient relates how well she is doing with recovery that she is not using anything.  Reviewed psych symptoms and being able to manage effectively, patient getting to a point of accepting them so not disturbing or distressing her which looks as if this may be her baseline.  Therapist provided her own  insight that possibly chronic symptoms of using crack could include hallucinations and being paranoid.  Therapist provided supportive intervention as well as helping patient process her feelings to help her with working through her emotions to help her in coping with stressor of medical issues.  Focused on moving forward and addressing issues with her doctors and being positive about addressing the stressors. .  Plan: Return again in 3 weeks.2.Therapist work with patient on mood regulation, stress management supportive interventions as patient continues to deal with medical issues  Diagnosis: Axis I: PTSD, hallucinations, GAD, Mood Disorder in conditions classified elsewhere    Axis II: No diagnosis    [name], LCSW 06/11/2019

## 2019-06-13 NOTE — Chronic Care Management (AMB) (Signed)
Chronic Care Management Pharmacy  Name: Judia Duhart  MRN: [id number] DOB: 24-Oct-1960  Chief Complaint/ HPI  [name],  59 y.o. , female presents for their Initial CCM visit with the clinical pharmacist via telephone due to COVID-19 Pandemic.  PCP : [name], MD  Their chronic conditions include: HTN, HLD COPD, GERD, DM, hypothyroidism, arthritis, anxiety/depression  Office Visits: 04/29/19 Dr Sharlet Salina OV: hospital f/u (appendecitis, unsuccessful appendectomy). Constipation worse, has f/u with surgeon.  Consult Visit: 05/11/19 Dr Dellis Filbert (OB/GYN): normal PAP. Continue Vitamin D and calcium, schedule BMD.   05/10/19 Dr De Nurse (psych): pt experiencing hallucinations, may be grief, PTSD, schizoaffective. Reduced seroquel to 25 mg at pt request.   05/05/19 ED visit: chest pain, workup WNL, GI cocktail improved pain.  03/30/19-04/14/19 hospital admission: acute appendicitis w/ ? Perforation  03/17/19 NP Groce (pulmonary): decrease Daliresp to 250 mcg daily, monitor LFTs. Quit smoking with Chantix.   02/13/19 ED visit: dislocated L hip.   Medications: Outpatient Encounter Medications as of 06/14/2019  Medication Sig Note  . acetaminophen (TYLENOL) 500 MG tablet Take 2 tablets (1,000 mg total) by mouth every 8 (eight) hours as needed for mild pain.   Marland Kitchen acyclovir (ZOVIRAX) 400 MG tablet Take 1 tablet (400 mg total) by mouth 2 (two) times daily. 05/05/2019: ONGOING  . albuterol (PROVENTIL) (2.5 MG/3ML) 0.083% nebulizer solution Take 3 mLs (2.5 mg total) by nebulization 2 (two) times daily. And every 6 hours as needed.  DX: J44.9 (Patient taking differently: Take 2.5 mg by nebulization 2 (two) times daily as needed for wheezing or shortness of breath (DX: J44.9). )   . atenolol (TENORMIN) 25 MG tablet TAKE 1 TABLET BY MOUTH  DAILY   . atorvastatin (LIPITOR) 20 MG tablet TAKE 1 TABLET BY MOUTH  DAILY   . budesonide-formoterol (SYMBICORT) 160-4.5 MCG/ACT inhaler Inhale 2  puffs into the lungs 2 (two) times daily.   Marland Kitchen buPROPion (WELLBUTRIN XL) 300 MG 24 hr tablet Take 300 mg by mouth daily.   . cyclobenzaprine (FLEXERIL) 10 MG tablet TAKE 1 TABLET BY MOUTH AT  BEDTIME   . diphenhydrAMINE (BENADRYL) 25 MG tablet Take 25 mg by mouth 2 (two) times daily.    Marland Kitchen docusate sodium (COLACE) 100 MG capsule Take 1 capsule (100 mg total) by mouth 2 (two) times daily.   Marland Kitchen gabapentin (NEURONTIN) 300 MG capsule TAKE 1 CAPSULE BY MOUTH  TWICE DAILY (Patient taking differently: Take 600 mg by mouth at bedtime. )   . hydrochlorothiazide (HYDRODIURIL) 25 MG tablet TAKE 1 TABLET BY MOUTH  DAILY   . Ipratropium-Albuterol (COMBIVENT RESPIMAT) 20-100 MCG/ACT AERS respimat Inhale 1 puff into the lungs every 6 (six) hours as needed for wheezing. (Patient taking differently: Inhale 2 puffs into the lungs in the morning and at bedtime. )   . levothyroxine (SYNTHROID) 25 MCG tablet TAKE 1 TABLET BY MOUTH  DAILY BEFORE BREAKFAST   . Liniments (SALONPAS ARTHRITIS PAIN RELIEF EX) Apply 1 patch topically daily as needed (to painful sites- remove as directed).    . Melatonin 10 MG TABS Take 10 mg by mouth at bedtime.   . montelukast (SINGULAIR) 10 MG tablet TAKE 1 TABLET BY MOUTH AT  BEDTIME (Patient taking differently: Take 10 mg by mouth at bedtime. )   . Multiple Vitamins-Minerals (ADULT ONE DAILY GUMMIES) CHEW Chew 1 tablet by mouth daily.    . naproxen (NAPROSYN) 500 MG tablet Take 500 mg by mouth in the morning and at bedtime.    Marland Kitchen  nystatin-triamcinolone ointment (MYCOLOG) Apply 1 application topically 2 (two) times daily as needed (applied to skin folds/groins/under breast skin irritation rash.). (Patient taking differently: Apply 1 application topically 2 (two) times daily as needed (to skin folds/groin/under breast skin- for irritation/rashes). )   . OXYGEN Inhale 2-3 L/min into the lungs See admin instructions. Inhale  2-3 L/min of oxygen into the lungs w/CPAP at bedtime and as needed for  shortness of breath with "strenuous activity" during the day   . pantoprazole (PROTONIX) 40 MG tablet TAKE 1 TABLET BY MOUTH  DAILY (Patient taking differently: Take 40 mg by mouth 2 (two) times daily. )   . polyethylene glycol (MIRALAX / GLYCOLAX) 17 g packet Take 17 g by mouth daily as needed for mild constipation.   . QUEtiapine (SEROQUEL) 25 MG tablet Take 1 tablet (25 mg total) by mouth at bedtime.   . roflumilast (DALIRESP) 500 MCG TABS tablet Take 1 tablet (500 mcg total) by mouth daily.   . traZODone (DESYREL) 100 MG tablet TAKE 1 TABLET BY MOUTH AT  BEDTIME   . venlafaxine XR (EFFEXOR-XR) 150 MG 24 hr capsule TAKE 1 CAPSULE BY MOUTH  DAILY WITH BREAKFAST   . buPROPion (WELLBUTRIN XL) 150 MG 24 hr tablet Take 1 tablet (150 mg total) by mouth 2 (two) times daily. (Patient not taking: Reported on 06/14/2019)    No facility-administered encounter medications on file as of 06/14/2019.    Current Diagnosis/Assessment:  SDOH Interventions     Most Recent Value  SDOH Interventions  SDOH Interventions for the Following Domains  Financial Strain  Financial Strain Interventions  Other (Comment) [pursuing patient assistance for Combivent]     Goals Addressed            This Visit's Progress   . Cholesterol: goal LDL < 70       CARE PLAN ENTRY (see longitudinal plan of care for additional care plan information)  Current Barriers:  . Uncontrolled hyperlipidemia, complicated by HTN, COPD, DM . Current antihyperlipidemic regimen: atorvastatin 20 mg daily . Previous antihyperlipidemic medications tried n/a . Most recent lipid panel:     Component Value Date/Time   CHOL 125 12/17/2018 1402   CHOL 134 08/19/2017 0822   TRIG 261.0 (H) 12/17/2018 1402   HDL 37.80 (L) 12/17/2018 1402   HDL 43 08/19/2017 0822   CHOLHDL 3 12/17/2018 1402   VLDL 52.2 (H) 12/17/2018 1402   LDLCALC 62 08/19/2017 0822   LDLDIRECT 56.0 12/17/2018 1402   . ASCVD risk enhancing conditions: DM, HTN  . 10-year  ASCVD risk score: 5.8%  Pharmacist Clinical Goal(s):  Marland Kitchen Over the next 30 days, patient will work with PharmD and providers towards optimized antihyperlipidemic therapy  Interventions: . Comprehensive medication review performed; medication list updated in electronic medical record.  Bertram Savin care team collaboration (see longitudinal plan of care) . Discussed cholesterol goals and benefits of statins  Patient Self Care Activities:  . Patient will focus on medication adherence by pill box  Initial goal documentation     . COPD       CARE PLAN ENTRY  Current Barriers:  . Chronic Disease Management support, education, and care coordination needs related to COPD . Financial barriers to obtain high-cost inhalers  Pharmacist Clinical Goal(s):  Marland Kitchen Over the next 30 days, patient will work with PharmD and providers towards optimized COPD therapy  Interventions: . Comprehensive medication review performed. . Completed patient assistance application for Combivent in office   Patient Self Care  Activities:  . Patient will continue using inhalers as directed . Patient will attach necessary documents to patient assistance application and mail  Initial goal documentation    . Diabetes: goal A1c < 6.5%       CARE PLAN ENTRY (see longitudinal plan of care for additional care plan information)  Current Barriers:  . Diabetes: diet-controlled; complicated by chronic medical conditions including HTN, COPD, HLD, Lab Results  Component Value Date   HGBA1C 6.2 (H) 03/31/2019  Kidney Function    Component Value Date/Time   GFR 54.52 (L) 04/29/2019 1600   GFRNONAA >60 05/05/2019 1330    . Current antihyperglycemic regimen: no meds . denies hypoglycemic symptoms, including dizziness, lightheadedness, shaking, sweating . denies hyperglycemic symptoms, including polyuria, polydipsia, polyphagia, nocturia, blurred vision, neuropathy . Current diet: no limitations; pt is not following  any particular diet . Current exercise: limited . Current blood glucose readings: n/a  Pharmacist Clinical Goal(s):  Marland Kitchen Over the next 30 days, patient will work with PharmD and primary care provider to maintain A1c < 6.5%  Interventions: . Comprehensive medication review performed, medication list updated in electronic medical record . Inter-disciplinary care team collaboration (see longitudinal plan of care) . Discussed importance of diet change to maintain A1c and prevent additional medications o Limit "white foods" - pasta, rice, bread, potatoes  Patient Self Care Activities:  . Patient will focus on diet changes including limitation of starches . Patient will report any questions or concerns to provider   Initial goal documentation     . Hypertension: blood pressure goal < 130/80       CARE PLAN ENTRY (see longitudinal plan of care for additional care plan information)  Current Barriers:  . controlled hypertension, complicated by HLD, DM, COPD . Current antihypertensive regimen: atenolol 25 mg daily, HCTZ 25 mg daily . Previous antihypertensives tried: metoprolol . Last practice recorded BP readings:  BP Readings from Last 3 Encounters:  05/24/19 128/70  05/11/19 130/70  05/05/19 130/64 .  Current home BP readings: does not check . Kidney Function Lab Results  Component Value Date   CREATININE 1.02 (H) 05/05/2019   CREATININE 1.04 04/29/2019   CREATININE 0.83 04/14/2019   Component Value Date/Time   GFR 54.52 (L) 04/29/2019 1600   GFRNONAA >60 05/05/2019 1330    Pharmacist Clinical Goal(s):  Marland Kitchen Over the next 30 days, patient will work with PharmD and providers to optimize antihypertensive regimen  Interventions: . Inter-disciplinary care team collaboration (see longitudinal plan of care) . Comprehensive medication review performed; medication list updated in the electronic medical record.  . Discussed importance of monitoring BP at home . Educated on BP goals and  complications of elevated BP  Patient Self Care Activities:  . Patient will continue to check BP 1-2 times weekly , document, and provide at future appointments . Patient will focus on medication adherence by pill box  Initial goal documentation       COPD / Asthma / Tobacco   Last spirometry score: FEV1 43% predicted, FEV1/FVC 0.47  Gold Grade: Gold 3 (FEV1 30-49%) Current COPD Classification:  D (high sx, >/=2 exacerbations/yr)  Eosinophil count:   Lab Results  Component Value Date/Time   EOSPCT 3 02/13/2019 02:09 PM  %                               Eos (Absolute):  Lab Results  Component Value Date/Time   EOSABS 0.3 02/13/2019  02:09 PM   Tobacco Status:  Social History   Tobacco Use  Smoking Status Former Smoker  . Packs/day: 1.00  . Years: 44.00  . Pack years: 44.00  . Types: Cigarettes  . Quit date: 11/30/2018  . Years since quitting: 0.5  Smokeless Tobacco Never Used  Tobacco Comment   Smoker 1-1.5 PPD every day, 11-20-2018 "i got myself down to half a pack", i am gonna be going on chantix on the 28th"    Patient has failed these meds in past: Atrovent, Duoneb, Xoponex, Dulera, Spiriva, Incruse Patient is currently controlled on the following medications: Symbicort 160/4.5 - 2 puff BID, Combivent 1 puff q6h prn, roflumilast 500 mcg daily, montelukast 10 mg HS, albuterol nebs  Using maintenance inhaler regularly? Yes Frequency of rescue inhaler use:  daily  We discussed:  proper inhaler technique; pt is using albuterol nebs and Symbicort twice a day, breathing is stable. Symbicort and Daliresp obtained through PAP. Seeking assistance for Combivent.  Plan  Continue current medications  Pursuing patient assistance for Combivent   Hypertension   Office blood pressures are  BP Readings from Last 3 Encounters:  05/24/19 128/70  05/11/19 130/70  05/05/19 130/64   Patient has failed these meds in the past: metoprolol Patient is currently controlled on the  following medications: atenolol 25 mg daily, HCTZ 25 mg daily  Patient checks BP at home infrequently  Patient home BP readings are ranging: n/a  We discussed diet and exercise extensively, BP goals, importance of compliance with meds and monitoring BP at home.  Plan  Continue current medications and control with diet and exercise  Recommended to monitor BP 1-2 times weekly at home    Hyperlipidemia   Lipid Panel     Component Value Date/Time   CHOL 125 12/17/2018 1402   CHOL 134 08/19/2017 0822   TRIG 261.0 (H) 12/17/2018 1402   HDL 37.80 (L) 12/17/2018 1402   HDL 43 08/19/2017 0822   CHOLHDL 3 12/17/2018 1402   VLDL 52.2 (H) 12/17/2018 1402   LDLCALC 62 08/19/2017 0822   LDLDIRECT 56.0 12/17/2018 1402   LABVLDL 29 08/19/2017 0822     The ASCVD Risk score (Goff DC Jr., et al., 2013) failed to calculate for the following reasons:   The valid total cholesterol range is 130 to 320 mg/dL   Patient has failed these meds in past: n/a Patient is currently controlled on the following medications: atorvastatin 20 mg daily  We discussed:  Cholesterol goals, benefits of statins, diet changes to improve triglycerides.  Plan  Continue current medications and control with diet and exercise   Diabetes   Recent Relevant Labs: Lab Results  Component Value Date/Time   HGBA1C 6.2 (H) 03/31/2019 04:17 AM   HGBA1C 6.7 (H) 12/17/2018 02:02 PM   GFR 54.52 (L) 04/29/2019 04:00 PM   GFR 54.66 (L) 08/05/2017 04:52 PM   MICROALBUR <0.7 12/17/2018 02:02 PM    No medication indicated - diet-controlled.  Patient has failed these meds in past: Lantus, Humalog Patient is currently controlled on the following medications: no meds  We discussed: diet and exercise extensively  Plan  Continue control with diet and exercise   Hypothyroidism   TSH  Date Value Ref Range Status  03/30/2019 1.156 0.350 - 4.500 uIU/mL Final    Patient has failed these meds in past: n/a Patient is  currently controlled on the following medications: levothyroxine 25 mcg daily  We discussed:  Pt endorses compliance with medication, does not  report s/sx of hypothyroidism.  Plan  Continue current medications   GERD/constipation   Patient has failed these meds in past: n/a Patient is currently controlled on the following medications: pantoprazole 40 mg daily, Miralax, docusate 100 mg BID  We discussed:  Pt reports she just increased PPI to twice a day last week due to uncontrolled GERD, she has not noticed much improvement yet. Constipation is currently relatively controlled with docusate alone, only uses Miralax prn.  Plan  Continue current medications   Arthritis/Pain   Patient has failed these meds in past: n/a Patient is currently controlled on the following medications: Tylenol 1000 mg q8h prn, naproxen 500 mg BID prn, gabapentin 600 mg HS, cyclobenzaprine 10 mg HS, Salonpas patch  We discussed:  Pt reports gabapentin is not helping much, requesting increase in dose. She had much better control in the past with pregabalin. Unfortunately pregabalin is Tier 3 with her formulary ($47/month), may possibly pursue tier exception.  Pt does report sharp pains in head recently, requesting PCP appt. Referred pt to appt team.  Plan  Continue current medications  Attempt tier exception for preabalin  Anxiety/depression   PHQ9 SCORE ONLY 05/24/2019 12/18/2018 11/18/2018  Score 13 3 1    Patient has failed these meds in past: Abilify,  Patient is currently controlled on the following medications: bupropion 300 mg, venlafaxine 150 mg qAM, quetiapine 25 mg HS, trazodone 100 mg HS  We discussed:  Pt reports she is relatively controlled on current regimen. She does endorse she "sees dead people". She is following with psych.  Plan  Continue current medications   Health Maintenance   Patient is currently controlled on the following medications: melatonin, multivitamin, Benadryl 25  mg BID  We discussed:  Patient reports she has significant allergy symptoms, she used to take Claritin but it became too expensive for her. Her insurance does include $40 worth of OTC meds per quarter, encouraged her to use this benefit for OTC H1 antagonists  Plan  Continue current medications   Medication Management   Pt uses [facility] mail order and [facility] for all medications Uses pill box Pt endorses compliance  We discussed: Patient is satisfied with current medication strategy, denies going without needed medications.  Patient assistance: Symbicort - [place]&Me Daliresp - [place]&Me Combivent - BI (pending)  Plan  Continue current medication management strategy      Follow up: 1 month phone visit  Charlene Brooke, PharmD Clinical Pharmacist [facility] [phone]

## 2019-06-14 ENCOUNTER — Other Ambulatory Visit: Payer: Self-pay

## 2019-06-14 ENCOUNTER — Ambulatory Visit: Payer: Medicare Other | Admitting: Pharmacist

## 2019-06-14 DIAGNOSIS — E785 Hyperlipidemia, unspecified: Secondary | ICD-10-CM

## 2019-06-14 DIAGNOSIS — E099 Drug or chemical induced diabetes mellitus without complications: Secondary | ICD-10-CM

## 2019-06-14 DIAGNOSIS — I10 Essential (primary) hypertension: Secondary | ICD-10-CM

## 2019-06-14 DIAGNOSIS — T380X5D Adverse effect of glucocorticoids and synthetic analogues, subsequent encounter: Secondary | ICD-10-CM

## 2019-06-14 DIAGNOSIS — J449 Chronic obstructive pulmonary disease, unspecified: Secondary | ICD-10-CM

## 2019-06-14 NOTE — Patient Instructions (Signed)
Visit Information  Thank you for meeting with me to discuss your medications! I look forward to working with you to achieve your health care goals. Below is a summary of what we talked about during the visit:  Goals Addressed            This Visit's Progress   . Cholesterol: goal LDL < 70       CARE PLAN ENTRY (see longitudinal plan of care for additional care plan information)  Current Barriers:  . Uncontrolled hyperlipidemia, complicated by HTN, COPD, DM . Current antihyperlipidemic regimen: atorvastatin 20 mg daily . Previous antihyperlipidemic medications tried n/a . Most recent lipid panel:     Component Value Date/Time   CHOL 125 12/17/2018 1402   CHOL 134 08/19/2017 0822   TRIG 261.0 (H) 12/17/2018 1402   HDL 37.80 (L) 12/17/2018 1402   HDL 43 08/19/2017 0822   CHOLHDL 3 12/17/2018 1402   VLDL 52.2 (H) 12/17/2018 1402   LDLCALC 62 08/19/2017 0822   LDLDIRECT 56.0 12/17/2018 1402   . ASCVD risk enhancing conditions: DM, HTN  . 10-year ASCVD risk score: 5.8%  Pharmacist Clinical Goal(s):  Marland Kitchen Over the next 30 days, patient will work with PharmD and providers towards optimized antihyperlipidemic therapy  Interventions: . Comprehensive medication review performed; medication list updated in electronic medical record.  Bertram Savin care team collaboration (see longitudinal plan of care) . Discussed cholesterol goals and benefits of statins  Patient Self Care Activities:  . Patient will focus on medication adherence by pill box  Initial goal documentation     . COPD       CARE PLAN ENTRY  Current Barriers:  . Chronic Disease Management support, education, and care coordination needs related to COPD . Financial barriers to obtain high-cost inhalers  Pharmacist Clinical Goal(s):  Marland Kitchen Over the next 30 days, patient will work with PharmD and providers towards optimized COPD therapy  Interventions: . Comprehensive medication review performed. . Completed  patient assistance application for Combivent in office   Patient Self Care Activities:  . Patient will continue using inhalers as directed . Patient will attach necessary documents to patient assistance application and mail  Initial goal documentation    . Diabetes: goal A1c < 6.5%       CARE PLAN ENTRY (see longitudinal plan of care for additional care plan information)  Current Barriers:  . Diabetes: diet-controlled; complicated by chronic medical conditions including HTN, COPD, HLD, Lab Results  Component Value Date   HGBA1C 6.2 (H) 03/31/2019  Kidney Function    Component Value Date/Time   GFR 54.52 (L) 04/29/2019 1600   GFRNONAA >60 05/05/2019 1330    . Current antihyperglycemic regimen: no meds . denies hypoglycemic symptoms, including dizziness, lightheadedness, shaking, sweating . denies hyperglycemic symptoms, including polyuria, polydipsia, polyphagia, nocturia, blurred vision, neuropathy . Current diet: no limitations; pt is not following any particular diet . Current exercise: limited . Current blood glucose readings: n/a  Pharmacist Clinical Goal(s):  Marland Kitchen Over the next 30 days, patient will work with PharmD and primary care provider to maintain A1c < 6.5%  Interventions: . Comprehensive medication review performed, medication list updated in electronic medical record . Inter-disciplinary care team collaboration (see longitudinal plan of care) . Discussed importance of diet change to maintain A1c and prevent additional medications o Limit "white foods" - pasta, rice, bread, potatoes  Patient Self Care Activities:  . Patient will focus on diet changes including limitation of starches . Patient will report any  questions or concerns to provider   Initial goal documentation     . Hypertension: blood pressure goal < 130/80       CARE PLAN ENTRY (see longitudinal plan of care for additional care plan information)  Current Barriers:  . controlled hypertension,  complicated by HLD, DM, COPD . Current antihypertensive regimen: atenolol 25 mg daily, HCTZ 25 mg daily . Previous antihypertensives tried: metoprolol . Last practice recorded BP readings:  BP Readings from Last 3 Encounters:  05/24/19 128/70  05/11/19 130/70  05/05/19 130/64 .  Current home BP readings: does not check . Kidney Function Lab Results  Component Value Date   CREATININE 1.02 (H) 05/05/2019   CREATININE 1.04 04/29/2019   CREATININE 0.83 04/14/2019   Component Value Date/Time   GFR 54.52 (L) 04/29/2019 1600   GFRNONAA >60 05/05/2019 1330    Pharmacist Clinical Goal(s):  Marland Kitchen Over the next 30 days, patient will work with PharmD and providers to optimize antihypertensive regimen  Interventions: . Inter-disciplinary care team collaboration (see longitudinal plan of care) . Comprehensive medication review performed; medication list updated in the electronic medical record.  . Discussed importance of monitoring BP at home . Educated on BP goals and complications of elevated BP  Patient Self Care Activities:  . Patient will continue to check BP 1-2 times weekly , document, and provide at future appointments . Patient will focus on medication adherence by pill box  Initial goal documentation        Joan [name] was given information about Chronic Care Management services today including:  1. CCM service includes personalized support from designated clinical staff supervised by her physician, including individualized plan of care and coordination with other care providers 2. 24/7 contact phone numbers for assistance for urgent and routine care needs. 3. Standard insurance, coinsurance, copays and deductibles apply for chronic care management only during months in which we provide at least 20 minutes of these services. Most insurances cover these services at 100%, however patients may be responsible for any copay, coinsurance and/or deductible if applicable. This service may  help you avoid the need for more expensive face-to-face services. 4. Only one practitioner may furnish and bill the service in a calendar month. 5. The patient may stop CCM services at any time (effective at the end of the month) by phone call to the office staff.  Patient agreed to services and verbal consent obtained.   Print copy of patient instructions provided.  Telephone follow up appointment with pharmacy team member scheduled for: 1 month  Charlene Brooke, PharmD Clinical Pharmacist [facility] [phone]   Preventing Type 2 Diabetes Mellitus Type 2 diabetes (type 2 diabetes mellitus) is a long-term (chronic) disease that affects blood sugar (glucose) levels. Normally, a hormone called insulin allows glucose to enter cells in the body. The cells use glucose for energy. In type 2 diabetes, one or both of these problems may be present:  The body does not make enough insulin.  The body does not respond properly to insulin that it makes (insulin resistance). Insulin resistance or lack of insulin causes excess glucose to build up in the blood instead of going into cells. As a result, high blood glucose (hyperglycemia) develops, which can cause many complications. Being overweight or obese and having an inactive (sedentary) lifestyle can increase your risk for diabetes. Type 2 diabetes can be delayed or prevented by making certain nutrition and lifestyle changes. What nutrition changes can be made?   Eat  healthy meals and snacks regularly. Keep a healthy snack with you for when you get hungry between meals, such as fruit or a handful of nuts.  Eat lean meats and proteins that are low in saturated fats, such as chicken, fish, egg whites, and beans. Avoid processed meats.  Eat plenty of fruits and vegetables and plenty of grains that have not been processed (whole grains). It is recommended that you eat: ? 1?2 cups of fruit every day. ? 2?3 cups of  vegetables every day. ? 6?8 oz of whole grains every day, such as oats, whole wheat, bulgur, brown rice, quinoa, and millet.  Eat low-fat dairy products, such as milk, yogurt, and cheese.  Eat foods that contain healthy fats, such as nuts, avocado, olive oil, and canola oil.  Drink water throughout the day. Avoid drinks that contain added sugar, such as soda or sweet tea.  Follow instructions from your health care provider about specific eating or drinking restrictions.  Control how much food you eat at a time (portion size). ? Check food labels to find out the serving sizes of foods. ? Use a kitchen scale to weigh amounts of foods.  Saute or steam food instead of frying it. Cook with water or broth instead of oils or butter.  Limit your intake of: ? Salt (sodium). Have no more than 1 tsp (2,400 mg) of sodium a day. If you have heart disease or high blood pressure, have less than ? tsp (1,500 mg) of sodium a day. ? Saturated fat. This is fat that is solid at room temperature, such as butter or fat on meat. What lifestyle changes can be made? Activity   Do moderate-intensity physical activity for at least 30 minutes on at least 5 days of the week, or as much as told by your health care provider.  Ask your health care provider what activities are safe for you. A mix of physical activities may be best, such as walking, swimming, cycling, and strength training.  Try to add physical activity into your day. For example: ? Park in spots that are farther away than usual, so that you walk more. For example, park in a far corner of the parking lot when you go to the office or the grocery store. ? Take a walk during your lunch break. ? Use stairs instead of elevators or escalators. Weight Loss  Lose weight as directed. Your health care provider can determine how much weight loss is best for you and can help you lose weight safely.  If you are overweight or obese, you may be instructed to  lose at least 5?7 % of your body weight. Alcohol and Tobacco   Limit alcohol intake to no more than 1 drink a day for nonpregnant women and 2 drinks a day for men. One drink equals 12 oz of beer, 5 oz of wine, or 1 oz of hard liquor.  Do not use any tobacco products, such as cigarettes, chewing tobacco, and e-cigarettes. If you need help quitting, ask your health care provider. Work With [place] Provider  Have your blood glucose tested regularly, as told by your health care provider.  Discuss your risk factors and how you can reduce your risk for diabetes.  Get screening tests as told by your health care provider. You may have screening tests regularly, especially if you have certain risk factors for type 2 diabetes.  Make an appointment with a diet and nutrition specialist (registered dietitian). A registered dietitian can  help you make a healthy eating plan and can help you understand portion sizes and food labels. Why are these changes important?  It is possible to prevent or delay type 2 diabetes and related health problems by making lifestyle and nutrition changes.  It can be difficult to recognize signs of type 2 diabetes. The best way to avoid possible damage to your body is to take actions to prevent the disease before you develop symptoms. What can happen if changes are not made?  Your blood glucose levels may keep increasing. Having high blood glucose for a long time is dangerous. Too much glucose in your blood can damage your blood vessels, heart, kidneys, nerves, and eyes.  You may develop prediabetes or type 2 diabetes. Type 2 diabetes can lead to many chronic health problems and complications, such as: ? Heart disease. ? Stroke. ? Blindness. ? Kidney disease. ? Depression. ? Poor circulation in the feet and legs, which could lead to surgical removal (amputation) in severe cases. Where to find support  Ask your health care provider to recommend a registered  dietitian, diabetes educator, or weight loss program.  Look for local or online weight loss groups.  Join a gym, fitness club, or outdoor activity group, such as a walking club. Where to find more information To learn more about diabetes and diabetes prevention, visit:  American Diabetes Association (ADA): www.diabetes.[employer] of Diabetes and Digestive and Kidney Diseases: FindSpin.nl To learn more about healthy eating, visit:  The U.S. Department of Agriculture Scientist, research (physical sciences)), Choose My Plate: http://wiley-williams.com/  Office of Disease Prevention and Health Promotion (ODPHP), Dietary Guidelines: SurferLive.at Summary  You can reduce your risk for type 2 diabetes by increasing your physical activity, eating healthy foods, and losing weight as directed.  Talk with your health care provider about your risk for type 2 diabetes. Ask about any blood tests or screening tests that you need to have. This information is not intended to replace advice given to you by your health care provider. Make sure you discuss any questions you have with your health care provider. Document Revised: 06/05/2018 Document Reviewed: 04/04/2015 Elsevier Patient Education  [place].

## 2019-06-15 ENCOUNTER — Ambulatory Visit (INDEPENDENT_AMBULATORY_CARE_PROVIDER_SITE_OTHER): Payer: Medicare Other | Admitting: Pulmonary Disease

## 2019-06-15 ENCOUNTER — Encounter: Payer: Self-pay | Admitting: Pulmonary Disease

## 2019-06-15 DIAGNOSIS — G4733 Obstructive sleep apnea (adult) (pediatric): Secondary | ICD-10-CM

## 2019-06-15 DIAGNOSIS — Z9989 Dependence on other enabling machines and devices: Secondary | ICD-10-CM

## 2019-06-15 DIAGNOSIS — J449 Chronic obstructive pulmonary disease, unspecified: Secondary | ICD-10-CM | POA: Diagnosis not present

## 2019-06-15 DIAGNOSIS — R05 Cough: Secondary | ICD-10-CM

## 2019-06-15 DIAGNOSIS — R911 Solitary pulmonary nodule: Secondary | ICD-10-CM | POA: Diagnosis not present

## 2019-06-15 DIAGNOSIS — R059 Cough, unspecified: Secondary | ICD-10-CM

## 2019-06-15 NOTE — Assessment & Plan Note (Signed)
Cause of cough is unclear, okay to use Delsym cough syrup 10 mL at bedtime and as needed in the daytime If persists, may need ENT evaluation ?  Due to increased sensitization of receptors now that she has finally quit smoking  Does not seem to be allergies or reflux

## 2019-06-15 NOTE — Progress Notes (Signed)
   Subjective:    Patient ID: Joan [name], female    DOB: May 06, 1960, 59 y.o.   MRN: [id number]  HPI   59 yo ex-smoker for follow-up of severe COPD and OSA  She finally quit smoking in October 2020 She had uneventful year -underwent bilateral hip replacements and that needed revision on the left side Underwent appendectomy 03/2019 for gangrenous appendicitis and needs repeat surgery for the same.  ED visit 05/05/2019 for atypical chest pain >> CT angiogram reviewed  I also have reviewed her PFTs from January 2021  She complains of chronic cough since she quit smoking, needs frequent cough drops  Compliant with CPAP 4 hours, 10 cm with full facemask, download was reviewed which shows good control of events and sporadic compliance with minimal leak  She has received her first Covid shot   Significant tests/ events reviewed  NPSG 02/2017 >> no OSA HST 04/2017 15/h   02/2019 pFTS >> RATIO 45, fev 1 37% - UNCHANGED , + bd response, DLCO 44%  10/2016 PFTs showed ratio 49, FEV1 of 36%, FVC 58% and DLCO of 34% consistent with severe obstruction.  11/2018 LDCT >> 2S, stable nodules 2.8 mm largest 11/2016 CT chest bullous changes BL, 73mm RUl & 2 mm LUL  nodules >> stable 11/2017    ONO10/2018 on CPAP/room air showed desaturation for 1 hour 45 minutes started on nocturnal oxygen>>   Review of Systems Patient denies significant dyspnea,cough, hemoptysis,  chest pain, palpitations, pedal edema, orthopnea, paroxysmal nocturnal dyspnea, lightheadedness, nausea, vomiting, abdominal or  leg pains      Objective:   Physical Exam   Gen. Pleasant, obese, in no distress ENT - no lesions, no post nasal drip Neck: No JVD, no thyromegaly, no carotid bruits Lungs: no use of accessory muscles, no dullness to percussion, decreased without rales or rhonchi  Cardiovascular: Rhythm regular, heart sounds  normal, no murmurs or gallops, no peripheral edema Musculoskeletal: No  deformities, no cyanosis or clubbing , no tremors        Assessment & Plan:

## 2019-06-15 NOTE — Assessment & Plan Note (Signed)
Please use your CPAP regularly Compliance should improve once her cough is better Download was reviewed

## 2019-06-15 NOTE — Assessment & Plan Note (Signed)
cleared for repeat surgery with due risk  Stay on Symbicort and Combivent like you are doing.

## 2019-06-15 NOTE — Assessment & Plan Note (Signed)
Next low-dose CT scanning can be in March 2022 since she already had a CT scan in March of this year

## 2019-06-15 NOTE — Patient Instructions (Signed)
You will be cleared for repeat surgery.  Stay on Symbicort and Combivent like you are doing.  Cause of cough is unclear, okay to use Delsym cough syrup 10 mL at bedtime and as needed in the daytime If persists, may need ENT evaluation  Please use your CPAP regularly  Next low-dose CT scanning can be in March 2022 since she already had a CT scan in March of this year

## 2019-06-16 ENCOUNTER — Ambulatory Visit (INDEPENDENT_AMBULATORY_CARE_PROVIDER_SITE_OTHER): Payer: Medicare Other | Admitting: Internal Medicine

## 2019-06-16 ENCOUNTER — Encounter: Payer: Self-pay | Admitting: Internal Medicine

## 2019-06-16 ENCOUNTER — Other Ambulatory Visit: Payer: Self-pay

## 2019-06-16 VITALS — BP 112/62 | HR 74 | Temp 98.3°F | Ht 68.0 in

## 2019-06-16 DIAGNOSIS — R519 Headache, unspecified: Secondary | ICD-10-CM | POA: Diagnosis not present

## 2019-06-16 DIAGNOSIS — M542 Cervicalgia: Secondary | ICD-10-CM | POA: Diagnosis not present

## 2019-06-16 DIAGNOSIS — J3089 Other allergic rhinitis: Secondary | ICD-10-CM | POA: Diagnosis not present

## 2019-06-16 DIAGNOSIS — J309 Allergic rhinitis, unspecified: Secondary | ICD-10-CM | POA: Insufficient documentation

## 2019-06-16 DIAGNOSIS — R29818 Other symptoms and signs involving the nervous system: Secondary | ICD-10-CM

## 2019-06-16 DIAGNOSIS — M549 Dorsalgia, unspecified: Secondary | ICD-10-CM

## 2019-06-16 DIAGNOSIS — K3532 Acute appendicitis with perforation and localized peritonitis, without abscess: Secondary | ICD-10-CM

## 2019-06-16 MED ORDER — PREGABALIN 75 MG PO CAPS: 75.0000 mg | ORAL_CAPSULE | Freq: Two times a day (BID) | ORAL | 1 refills | Status: DC

## 2019-06-16 NOTE — Assessment & Plan Note (Signed)
Still needs repeat surgery and meeting with surgeon end of the month to discuss and likely plan date for surgery.

## 2019-06-16 NOTE — Assessment & Plan Note (Signed)
Referral to allergy for consideration of allergy shots. Advised her that she would likely have to repeat allergy testing since her last was done up [place] many years ago and her allergies may have changed since that time and there are different allergens locally.

## 2019-06-16 NOTE — Assessment & Plan Note (Signed)
Rx lyrica 75 mg BID which is prior dosing which was effective. If approved she will stop gabapentin which is not effective for her pain.

## 2019-06-16 NOTE — Progress Notes (Signed)
   Subjective:   Patient ID: Joan [name], female    DOB: Apr 08, 1960, 59 y.o.   MRN: [id number]  HPI The patient is a 59 YO female coming in for concerns about head pain (getting problems with her neck and left arm going numb intermittently, x-ray neck Oct 2020 with potential nerve impingement left C4-5, also now have new pains in the head like a headache but focal, last less than a minute, she has not tried anything for them, is worried neck may be worsening, going on for a month or so and getting more often) and allergy problems (chronic issues with this, takes benadryl for it and she worries about taking too much, denies fevers or chills, denies sinus pressure or worries about current sinus infection, would like to try allergy shots potentially) and nerve pain (she has had for some time, taking gabapentin currently but this is not helping, previously had tried lyrica and this did help much better but insurance would not cover, she has changed plans and would like to retry lyrica for better control).   Review of Systems  Constitutional: Positive for activity change.  HENT: Positive for congestion, postnasal drip and rhinorrhea.   Eyes: Positive for itching.  Respiratory: Negative for cough, chest tightness and shortness of breath.   Cardiovascular: Negative for chest pain, palpitations and leg swelling.  Gastrointestinal: Negative for abdominal distention, abdominal pain, constipation, diarrhea, nausea and vomiting.  Musculoskeletal: Positive for arthralgias and neck pain.  Skin: Negative.   Neurological: Positive for numbness and headaches.  Psychiatric/Behavioral: Negative.     Objective:  Physical Exam Constitutional:      Appearance: She is well-developed.  HENT:     Head: Normocephalic and atraumatic.     Comments: Oropharynx with redness and clear drainage Cardiovascular:     Rate and Rhythm: Normal rate and regular rhythm.  Pulmonary:     Effort: Pulmonary effort is  normal. No respiratory distress.     Breath sounds: No wheezing or rales.     Comments: Stable lung exam Abdominal:     General: Bowel sounds are normal. There is no distension.     Palpations: Abdomen is soft.     Tenderness: There is no abdominal tenderness. There is no rebound.  Musculoskeletal:        General: Tenderness present.     Cervical back: Normal range of motion.     Comments: No tenderness in the head currently, left arm not numb currently  Skin:    General: Skin is warm and dry.  Neurological:     Mental Status: She is alert and oriented to person, place, and time.     Coordination: Coordination abnormal.     Comments: Walks short distance normally, long distance uses manual wheelchair     Vitals:   06/16/19 1037  BP: 112/62  Pulse: 74  Temp: 98.3 F (36.8 C)  SpO2: 98%  Height: 5\' 8"  (1.727 m)    This visit occurred during the SARS-CoV-2 public health emergency.  Safety protocols were in place, including screening questions prior to the visit, additional usage of staff PPE, and extensive cleaning of exam room while observing appropriate contact time as indicated for disinfecting solutions.   Assessment & Plan:

## 2019-06-16 NOTE — Assessment & Plan Note (Signed)
Checking CT head, prior in 2018 without changes. Given the progression of her symptoms this represents change in headache pattern and is appropriate for evaluation. Not taking medications currently due to short nature of the severe pain.

## 2019-06-16 NOTE — Assessment & Plan Note (Signed)
Ordered CT neck to evaluate nerve impingement at C4-5 given her persistent numbness left arm.

## 2019-06-16 NOTE — Patient Instructions (Signed)
We will try to get you back on the lyrica.  We will check a CT scan of the neck and the head.   We will get you in with the allergy doctor for the allergy shots.

## 2019-06-18 NOTE — Addendum Note (Signed)
Addended by: Karle Barr on: 06/18/2019 09:12 AM   Modules accepted: Orders

## 2019-06-21 DIAGNOSIS — G4733 Obstructive sleep apnea (adult) (pediatric): Secondary | ICD-10-CM | POA: Diagnosis not present

## 2019-06-21 DIAGNOSIS — J449 Chronic obstructive pulmonary disease, unspecified: Secondary | ICD-10-CM | POA: Diagnosis not present

## 2019-06-21 DIAGNOSIS — J441 Chronic obstructive pulmonary disease with (acute) exacerbation: Secondary | ICD-10-CM | POA: Diagnosis not present

## 2019-06-23 DIAGNOSIS — J449 Chronic obstructive pulmonary disease, unspecified: Secondary | ICD-10-CM | POA: Diagnosis not present

## 2019-06-23 DIAGNOSIS — J441 Chronic obstructive pulmonary disease with (acute) exacerbation: Secondary | ICD-10-CM | POA: Diagnosis not present

## 2019-06-23 DIAGNOSIS — G4733 Obstructive sleep apnea (adult) (pediatric): Secondary | ICD-10-CM | POA: Diagnosis not present

## 2019-06-23 DIAGNOSIS — J9611 Chronic respiratory failure with hypoxia: Secondary | ICD-10-CM | POA: Diagnosis not present

## 2019-06-24 ENCOUNTER — Ambulatory Visit: Payer: Self-pay | Admitting: General Surgery

## 2019-06-24 NOTE — H&P (Signed)
History of Present Illness Joan Ok MD; 06/24/2019 12:08 PM) The patient is a 59 year old female who presents with a complaint of post. Patient follows back up today secondary to perforated appendicitis with a long appendiceal stump. Patient did have partial appendectomy due to inflammation at the completion of therapy could not be done. Previous CT scan did show a fecalith at the appendiceal orifice. Patient is anxious and proceeding with completion appendectomy.   Patient sees Dr. Elsworth Soho is her pulmonologist.   -------------- Patient is a 59 year old female status post perforated appendicitis, status post lap appendectomy. Patient did well during hospital stay aside from ileus. Patient follow-up CT scan which revealed a long retrocecal appendiceal stump, and phlegmon.  Patient states she's had no fevers at home. She's had some constipation secondary to narcotic pain medication. She has had no diarrhea.   Allergies (Diane Herrin, RN; 06/24/2019 11:48 AM) Dilaudid *ANALGESICS - OPIOID*  Keflex *CEPHALOSPORINS*  PrednisoLONE *CORTICOSTEROIDS*  Tetracycline *CHEMICALS*  Shellfish  Allergies Reconciled   Medication History (Diane Herrin, RN; 06/24/2019 11:48 AM) Ventolin HFA (108 (90 Base)MCG/ACT Aerosol Soln, Inhalation) Active. Atenolol (25MG  Tablet, Oral) Active. Chantix Continuing Month Pak (1MG  Tablet, Oral) Active. Incruse Ellipta (62.5MCG/INH Aero Pow Br Act, Inhalation) Active. Levothyroxine Sodium (25MCG Tablet, Oral) Active. Naproxen (500MG  Tablet, Oral) Active. Albuterol Sulfate ((2.5 MG/3ML)0.083% Nebulized Soln, Inhalation) Active. Acetaminophen (325MG  Tablet, Oral) Active. Atorvastatin Calcium (20MG  Tablet, Oral) Active. Baclofen (10MG  Tablet, Oral) Active. ClonazePAM (0.5MG  Tablet, Oral) Active. Daliresp (500MCG Tablet, Oral) Active. Venlafaxine HCl ER (150MG  Capsule ER 24HR, Oral) Active. HydroCHLOROthiazide (25MG  Tablet, Oral)  Active. Lyrica (75MG  Capsule, Oral) Active. Metoprolol Tartrate (25MG  Tablet, Oral) Active. Montelukast Sodium (10MG  Tablet, Oral) Active. Naproxen Sodium (500MG  Tablet ER, Oral) Active. Chantix Starting Month Pak (0.5 MG X 11 &1 MG X 42 Tablet, Oral) Active. Pantoprazole Sodium (40MG  Tablet DR, Oral) Active. Spiriva HandiHaler (18MCG Capsule, Inhalation) Active. Symbicort (160-4.5MCG/ACT Aerosol, Inhalation) Active. ValACYclovir HCl (500MG  Tablet, Oral) Active. Xopenex (Inhalation) Specific strength unknown - Active. Medications Reconciled  Vitals (Diane Herrin RN; 06/24/2019 11:54 AM) 06/24/2019 11:48 AM Weight: 202.25 lb Height: 67in Body Surface Area: 2.03 m Body Mass Index: 31.68 kg/m  Temp.: 98.76F  Pulse: 84 (Regular)  P.OX: 94% (Room air) BP: 164/80(Sitting, Left Arm, Standard)       Physical Exam Joan Ok, MD; 06/24/2019 12:9 PM) The physical exam findings are as follows: Note: Constitutional: No acute distress, conversant, appears stated age  Eyes: Anicteric sclerae, moist conjunctiva, no lid lag  Neck: No thyromegaly, trachea midline, no cervical lymphadenopathy  Lungs: Clear to auscultation biilaterally, normal respiratory effot  Cardiovascular: regular rate & rhythm, no murmurs, no peripheal edema, pedal pulses 2+  GI: Soft, no masses or hepatosplenomegaly, non-tender to palpation, incision is well-healed.  MSK: Normal gait, no clubbing cyanosis, edema  Skin: No rashes, palpation reveals normal skin turgor  Psychiatric: Appropriate judgment and insight, oriented to person, place, and time    Assessment & Plan Joan Ok MD; 06/24/2019 12:08 PM) STATUS POST LAPAROSCOPIC APPENDECTOMY (Z90.49) Impression: Patient is a 59 year old female status post lap appendectomy for perforated appendicitis. Patient follow-up CT scan was found to have a long retrocecal appendiceal stump within the phlegmon.  Recent CT scan appears to  have minimal amount of inflammation, continued appendiceal stump appears to be long. We'll proceed to the operating room for laparoscopic appendectomy.  I discussed with her the risks and benefits of the procedure to include but not limited to: Infection, bleeding, damage to structures, possible need  for open surgery. The patient was understanding and wished to proceed.

## 2019-06-24 NOTE — H&P (View-Only) (Signed)
History of Present Illness Joan Ok MD; 06/24/2019 12:08 PM) The patient is a 59 year old female who presents with a complaint of post. Patient follows back up today secondary to perforated appendicitis with a long appendiceal stump. Patient did have partial appendectomy due to inflammation at the completion of therapy could not be done. Previous CT scan did show a fecalith at the appendiceal orifice. Patient is anxious and proceeding with completion appendectomy.   Patient sees Dr. Elsworth Soho is her pulmonologist.   -------------- Patient is a 59 year old female status post perforated appendicitis, status post lap appendectomy. Patient did well during hospital stay aside from ileus. Patient follow-up CT scan which revealed a long retrocecal appendiceal stump, and phlegmon.  Patient states she's had no fevers at home. She's had some constipation secondary to narcotic pain medication. She has had no diarrhea.   Allergies (Diane Herrin, RN; 06/24/2019 11:48 AM) Dilaudid *ANALGESICS - OPIOID*  Keflex *CEPHALOSPORINS*  PrednisoLONE *CORTICOSTEROIDS*  Tetracycline *CHEMICALS*  Shellfish  Allergies Reconciled   Medication History (Diane Herrin, RN; 06/24/2019 11:48 AM) Ventolin HFA (108 (90 Base)MCG/ACT Aerosol Soln, Inhalation) Active. Atenolol (25MG  Tablet, Oral) Active. Chantix Continuing Month Pak (1MG  Tablet, Oral) Active. Incruse Ellipta (62.5MCG/INH Aero Pow Br Act, Inhalation) Active. Levothyroxine Sodium (25MCG Tablet, Oral) Active. Naproxen (500MG  Tablet, Oral) Active. Albuterol Sulfate ((2.5 MG/3ML)0.083% Nebulized Soln, Inhalation) Active. Acetaminophen (325MG  Tablet, Oral) Active. Atorvastatin Calcium (20MG  Tablet, Oral) Active. Baclofen (10MG  Tablet, Oral) Active. ClonazePAM (0.5MG  Tablet, Oral) Active. Daliresp (500MCG Tablet, Oral) Active. Venlafaxine HCl ER (150MG  Capsule ER 24HR, Oral) Active. HydroCHLOROthiazide (25MG  Tablet, Oral)  Active. Lyrica (75MG  Capsule, Oral) Active. Metoprolol Tartrate (25MG  Tablet, Oral) Active. Montelukast Sodium (10MG  Tablet, Oral) Active. Naproxen Sodium (500MG  Tablet ER, Oral) Active. Chantix Starting Month Pak (0.5 MG X 11 &1 MG X 42 Tablet, Oral) Active. Pantoprazole Sodium (40MG  Tablet DR, Oral) Active. Spiriva HandiHaler (18MCG Capsule, Inhalation) Active. Symbicort (160-4.5MCG/ACT Aerosol, Inhalation) Active. ValACYclovir HCl (500MG  Tablet, Oral) Active. Xopenex (Inhalation) Specific strength unknown - Active. Medications Reconciled  Vitals (Diane Herrin RN; 06/24/2019 11:54 AM) 06/24/2019 11:48 AM Weight: 202.25 lb Height: 67in Body Surface Area: 2.03 m Body Mass Index: 31.68 kg/m  Temp.: 98.40F  Pulse: 84 (Regular)  P.OX: 94% (Room air) BP: 164/80(Sitting, Left Arm, Standard)       Physical Exam Joan Ok, MD; 06/24/2019 12:9 PM) The physical exam findings are as follows: Note: Constitutional: No acute distress, conversant, appears stated age  Eyes: Anicteric sclerae, moist conjunctiva, no lid lag  Neck: No thyromegaly, trachea midline, no cervical lymphadenopathy  Lungs: Clear to auscultation biilaterally, normal respiratory effot  Cardiovascular: regular rate & rhythm, no murmurs, no peripheal edema, pedal pulses 2+  GI: Soft, no masses or hepatosplenomegaly, non-tender to palpation, incision is well-healed.  MSK: Normal gait, no clubbing cyanosis, edema  Skin: No rashes, palpation reveals normal skin turgor  Psychiatric: Appropriate judgment and insight, oriented to person, place, and time    Assessment & Plan Joan Ok MD; 06/24/2019 12:08 PM) STATUS POST LAPAROSCOPIC APPENDECTOMY (Z90.49) Impression: Patient is a 59 year old female status post lap appendectomy for perforated appendicitis. Patient follow-up CT scan was found to have a long retrocecal appendiceal stump within the phlegmon.  Recent CT scan appears to  have minimal amount of inflammation, continued appendiceal stump appears to be long. We'll proceed to the operating room for laparoscopic appendectomy.  I discussed with her the risks and benefits of the procedure to include but not limited to: Infection, bleeding, damage to structures, possible need  for open surgery. The patient was understanding and wished to proceed.

## 2019-06-25 ENCOUNTER — Telehealth: Payer: Self-pay | Admitting: Pulmonary Disease

## 2019-06-25 NOTE — Telephone Encounter (Signed)
Spoke with pt. She is inquiring about a surgical clearance form. We do have the form here in the office but Dr. Elsworth Soho is not in the [place] office to sign it. In his last OV note, he documented that the pt was cleared for surgery. I have faxed the OV note to Lindwood Coke at [facility] at [phone]. Pt is aware of all of this information. Nothing further needed.

## 2019-06-28 ENCOUNTER — Telehealth: Payer: Self-pay | Admitting: Internal Medicine

## 2019-06-28 NOTE — Telephone Encounter (Signed)
   Patient requesting shoulder xray to be done on Friday when getting CTscan

## 2019-06-29 ENCOUNTER — Ambulatory Visit (INDEPENDENT_AMBULATORY_CARE_PROVIDER_SITE_OTHER): Payer: Medicare Other | Admitting: Licensed Clinical Social Worker

## 2019-06-29 DIAGNOSIS — R443 Hallucinations, unspecified: Secondary | ICD-10-CM

## 2019-06-29 DIAGNOSIS — F0630 Mood disorder due to known physiological condition, unspecified: Secondary | ICD-10-CM | POA: Diagnosis not present

## 2019-06-29 DIAGNOSIS — F4312 Post-traumatic stress disorder, chronic: Secondary | ICD-10-CM

## 2019-06-29 DIAGNOSIS — F411 Generalized anxiety disorder: Secondary | ICD-10-CM
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
## 2019-07-01 NOTE — Telephone Encounter (Signed)
Would need assessment of shoulder pain.

## 2019-07-01 NOTE — Telephone Encounter (Signed)
Spoke with patient and info given. She will call back to set up appointment if shoulder pain is not even better. She states her shoulder pain wasn't bad today.

## 2019-07-02 ENCOUNTER — Ambulatory Visit
Admission: RE | Admit: 2019-07-02 | Discharge: 2019-07-02 | Disposition: A | Discharge status: Home / Self Care | Payer: Medicare Other | Source: Ambulatory Visit | Attending: Internal Medicine | Admitting: Internal Medicine

## 2019-07-02 DIAGNOSIS — M542 Cervicalgia: Secondary | ICD-10-CM

## 2019-07-02 DIAGNOSIS — M47812 Spondylosis without myelopathy or radiculopathy, cervical region: Secondary | ICD-10-CM | POA: Diagnosis not present

## 2019-07-02 DIAGNOSIS — R29818 Other symptoms and signs involving the nervous system: Secondary | ICD-10-CM

## 2019-07-06 ENCOUNTER — Other Ambulatory Visit (HOSPITAL_COMMUNITY)
Admission: RE | Admit: 2019-07-06 | Discharge: 2019-07-06 | Disposition: A | Discharge status: Home / Self Care | Payer: Medicare Other | Source: Ambulatory Visit | Attending: General Surgery | Admitting: General Surgery

## 2019-07-06 DIAGNOSIS — Z01812 Encounter for preprocedural laboratory examination: Secondary | ICD-10-CM | POA: Diagnosis not present

## 2019-07-06 DIAGNOSIS — Z20822 Contact with and (suspected) exposure to covid-19: Secondary | ICD-10-CM | POA: Diagnosis not present

## 2019-07-06 LAB — SARS CORONAVIRUS 2 (TAT 6-24 HRS): SARS Coronavirus 2: NEGATIVE

## 2019-07-08 ENCOUNTER — Encounter (HOSPITAL_COMMUNITY): Payer: Self-pay | Admitting: General Surgery

## 2019-07-08 ENCOUNTER — Other Ambulatory Visit: Payer: Self-pay

## 2019-07-08 NOTE — Progress Notes (Signed)
Pt denies any acute pulmonary issues. Pt denies chest pain and being under the care of a cardiologist. Pt stated that she is under the care of Dr. Sharlet Salina, PCP, Dr. Elsworth Soho, Pulmonology and Dr. De Nurse, Behavioral Health. Pt denies recent labs. Pt stated that she does not take medication for diabetes and that she does not check her CBG. Pt made aware to stop taking Melatonin,  Aspirin (unless otherwise advised by surgeon), vitamins, fish oil and herbal medications. Do not take any NSAIDs ie: Ibuprofen, Advil, Naproxen  ( Naprosyn/Aleve), Motrin, BC and Goody Powder. Pt reminded to quarantine. Pt verbalized understanding of all pre-op instructions. PA, Anesthesiology asked to review pt history; see note.

## 2019-07-08 NOTE — Progress Notes (Signed)
Anesthesia Chart Review: Same day workup  Recent perforated appendicitis, status post lap appendectomy 03/31/19. Patient did well during hospital stay aside from ileus. Follow-up CT scan which revealed a long retrocecal appendiceal stump, and phlegmon.  Pt follows with pulmonology for hx of severe COPD, OSA on CPAP, former smoker quit 10/20. Pt notes persistent cough since quitting smoking. Last seen by Dr. Elsworth Soho 06/15/19, discussed surgery. Per note, "cleared for repeat surgery with due risk. Stay on Symbicort and Combivent like you are doing."  Pt has previously had reassuring workups of chest pain. Low risk nuclear scan in 2019 and more recently coronary CT with FFR 01/05/19 showing no significant obstructive disease.   Sinus rhythm Anteroseptal infarct, age indeterminat Sinus rhythm. Rate 70. Anteroseptal infarct, age indeterminate  Will need DOS labs and eval.  CTA chest 05/05/19: IMPRESSION: No evidence of pulmonary emboli.  Changes of prior granulomatous disease.  No other focal abnormality is seen.  Aortic Atherosclerosis (ICD10-I70.0) and Emphysema (ICD10-J43.9).  PFT 03/17/19: FVC-%Pred-Pre Latest Units: % 64  FEV1-%Pred-Pre Latest Units: % 37  FEV1FVC-%Pred-Pre Latest Units: % 57  TLC % pred Latest Units: % 106  DLCO unc % pred Latest Units: % 44   Coronary CT FFR 01/05/19: 1. Left Main:  No significant stenosis.  FFRct 0.97  2. LAD: No significant stenosis. FFRct 0.93 proximal, 0.93 mid, 0.91 distal 3. LCX: No significant stenosis.  FFRct 0.97 proximal, 0.92 distal 4. RCA: No significant stenosis.  FFRct 0.99 proximal, 0.94 distal  IMPRESSION: 1.  CT FFR analysis didn't show any significant stenosis.  2. Recommend aggressive risk factor modification including high potency statin.  Carotid US 09/29/17: Final Interpretation:  Right Carotid: Velocities in the right ICA are consistent with a 1-39%  stenosis.   Left Carotid: Velocities in the left ICA are  consistent with a 1-39%  stenosis.   Vertebrals: Bilateral vertebral arteries demonstrate antegrade flow.  Subclavians: Normal flow hemodynamics were seen in bilateral subclavian        arteries.   Event monitor 08/21/17: NSR  Nuclear stress 08/19/17:  Nuclear stress EF: 60%. Mild inferoseptal wall hypokinesis  There was no ST segment deviation noted during stress.  This is a low risk study. No ischemia identified.  The study is normal.  TTE 09/27/16: Study Conclusions   - Left ventricle: The cavity size was normal. Wall thickness was  normal. Systolic function was normal. The estimated ejection  fraction was in the range of 55% to 60%. Wall motion was normal;  there were no regional wall motion abnormalities. Doppler  parameters are consistent with abnormal left ventricular  relaxation (grade 1 diastolic dysfunction).  - Mitral valve: Calcified annulus.   Impressions:   - Normal LV systolic function; mild diastolic dysfunction.    Wynonia Musty [facility]/Anesthesiology Phone [phone] 07/08/2019 2:09 PM

## 2019-07-08 NOTE — Anesthesia Preprocedure Evaluation (Addendum)
Anesthesia Evaluation  Patient identified by MRN, date of birth, ID band Patient awake    Reviewed: Allergy & Precautions, NPO status , Patient's Chart, lab work & pertinent test results, reviewed documented beta blocker date and time   History of Anesthesia Complications Negative for: history of anesthetic complications  Airway Mallampati: II  TM Distance: >3 FB Neck ROM: Full    Dental  (+) Dental Advisory Given, Partial Lower, Partial Upper   Pulmonary asthma , sleep apnea (2L O2), Continuous Positive Airway Pressure Ventilation and Oxygen sleep apnea , COPD,  COPD inhaler, former smoker,    Pulmonary exam normal breath sounds clear to auscultation       Cardiovascular hypertension, Pt. on home beta blockers and Pt. on medications (-) angina(-) CAD and (-) Past MI Normal cardiovascular exam Rhythm:Regular Rate:Normal     Neuro/Psych  Headaches, PSYCHIATRIC DISORDERS Anxiety Depression Bipolar Disorder    GI/Hepatic GERD  Medicated,(+) Hepatitis -, C  Endo/Other  diabetes, Type 2Hypothyroidism   Renal/GU negative Renal ROS     Musculoskeletal  (+) Arthritis ,   Abdominal   Peds  Hematology negative hematology ROS (+)   Anesthesia Other Findings   Reproductive/Obstetrics                           Anesthesia Physical Anesthesia Plan  ASA: III  Anesthesia Plan: General   Post-op Pain Management:    Induction: Intravenous  PONV Risk Score and Plan: 4 or greater and Diphenhydramine, Scopolamine patch - Pre-op, Midazolam, Dexamethasone and Ondansetron  Airway Management Planned: Oral ETT  Additional Equipment:   Intra-op Plan:   Post-operative Plan: Extubation in OR  Informed Consent: I have reviewed the patients History and Physical, chart, labs and discussed the procedure including the risks, benefits and alternatives for the proposed anesthesia with the patient or authorized  representative who has indicated his/her understanding and acceptance.     Dental advisory given  Plan Discussed with: CRNA  Anesthesia Plan Comments: (PAT note by Karoline Caldwell, PA-C: Recent perforated appendicitis, status post lap appendectomy 03/31/19. Patient did well during hospital stay aside from ileus. Follow-up CT scan which revealed a long retrocecal appendiceal stump, and phlegmon.  Pt follows with pulmonology for hx of severe COPD, OSA on CPAP, former smoker quit 10/20. Pt notes persistent cough since quitting smoking. Last seen by Dr. Elsworth Soho 06/15/19, discussed surgery. Per note, "cleared for repeat surgery with due risk. Stay on Symbicort and Combivent like you are doing."  Pt has previously had reassuring workups of chest pain. Low risk nuclear scan in 2019 and more recently coronary CT with FFR 01/05/19 showing no significant obstructive disease.   Sinus rhythm Anteroseptal infarct, age indeterminat Sinus rhythm. Rate 70. Anteroseptal infarct, age indeterminate  Will need DOS labs and eval.  CTA chest 05/05/19: IMPRESSION: No evidence of pulmonary emboli.  Changes of prior granulomatous disease.  No other focal abnormality is seen.  Aortic Atherosclerosis (ICD10-I70.0) and Emphysema (ICD10-J43.9).  PFT 03/17/19: FVC-%Pred-Pre Latest Units: % 64 FEV1-%Pred-Pre Latest Units: % 37 FEV1FVC-%Pred-Pre Latest Units: % 57 TLC % pred Latest Units: % 106 DLCO unc % pred Latest Units: % 44  Coronary CT FFR 01/05/19: 1. Left Main:  No significant stenosis.  FFRct 0.97  2. LAD: No significant stenosis. FFRct 0.93 proximal, 0.93 mid, 0.91 distal 3. LCX: No significant stenosis.  FFRct 0.97 proximal, 0.92 distal 4. RCA: No significant stenosis.  FFRct 0.99 proximal, 0.94 distal  IMPRESSION:  1.  CT FFR analysis didn't show any significant stenosis.  2. Recommend aggressive risk factor modification including high potency statin.  Carotid US 09/29/17: Final  Interpretation:  Right Carotid: Velocities in the right ICA are consistent with a 1-39%  stenosis.   Left Carotid: Velocities in the left ICA are consistent with a 1-39%  stenosis.   Vertebrals: Bilateral vertebral arteries demonstrate antegrade flow.  Subclavians: Normal flow hemodynamics were seen in bilateral subclavian        arteries.   Event monitor 08/21/17: NSR  Nuclear stress 08/19/17: Nuclear stress EF: 60%. Mild inferoseptal wall hypokinesis There was no ST segment deviation noted during stress. This is a low risk study. No ischemia identified. The study is normal.  TTE 09/27/16: Study Conclusions   - Left ventricle: The cavity size was normal. Wall thickness was  normal. Systolic function was normal. The estimated ejection  fraction was in the range of 55% to 60%. Wall motion was normal;  there were no regional wall motion abnormalities. Doppler  parameters are consistent with abnormal left ventricular  relaxation (grade 1 diastolic dysfunction).  - Mitral valve: Calcified annulus.   Impressions:   - Normal LV systolic function; mild diastolic dysfunction.  )      Anesthesia Quick Evaluation

## 2019-07-09 ENCOUNTER — Encounter (HOSPITAL_COMMUNITY): Payer: Self-pay | Admitting: General Surgery

## 2019-07-09 ENCOUNTER — Ambulatory Visit (HOSPITAL_COMMUNITY): Payer: Medicare Other | Admitting: Physician Assistant

## 2019-07-09 ENCOUNTER — Ambulatory Visit (HOSPITAL_COMMUNITY)
Admission: RE | Admit: 2019-07-09 | Discharge: 2019-07-09 | Disposition: A | Discharge status: Home / Self Care | Payer: Medicare Other | Attending: General Surgery | Admitting: General Surgery

## 2019-07-09 ENCOUNTER — Encounter (HOSPITAL_COMMUNITY)
Admission: RE | Disposition: A | Discharge status: Home / Self Care | Payer: Self-pay | Source: Home / Self Care | Attending: General Surgery

## 2019-07-09 ENCOUNTER — Other Ambulatory Visit: Payer: Self-pay

## 2019-07-09 DIAGNOSIS — Z7951 Long term (current) use of inhaled steroids: Secondary | ICD-10-CM | POA: Insufficient documentation

## 2019-07-09 DIAGNOSIS — J45909 Unspecified asthma, uncomplicated: Secondary | ICD-10-CM | POA: Diagnosis not present

## 2019-07-09 DIAGNOSIS — E119 Type 2 diabetes mellitus without complications: Secondary | ICD-10-CM | POA: Diagnosis not present

## 2019-07-09 DIAGNOSIS — I10 Essential (primary) hypertension: Secondary | ICD-10-CM | POA: Diagnosis not present

## 2019-07-09 DIAGNOSIS — B1920 Unspecified viral hepatitis C without hepatic coma: Secondary | ICD-10-CM | POA: Insufficient documentation

## 2019-07-09 DIAGNOSIS — F319 Bipolar disorder, unspecified: Secondary | ICD-10-CM | POA: Diagnosis not present

## 2019-07-09 DIAGNOSIS — Z79890 Hormone replacement therapy (postmenopausal): Secondary | ICD-10-CM | POA: Diagnosis not present

## 2019-07-09 DIAGNOSIS — J449 Chronic obstructive pulmonary disease, unspecified: Secondary | ICD-10-CM | POA: Diagnosis not present

## 2019-07-09 DIAGNOSIS — Z87891 Personal history of nicotine dependence: Secondary | ICD-10-CM | POA: Diagnosis not present

## 2019-07-09 DIAGNOSIS — Z79899 Other long term (current) drug therapy: Secondary | ICD-10-CM | POA: Diagnosis not present

## 2019-07-09 DIAGNOSIS — J441 Chronic obstructive pulmonary disease with (acute) exacerbation: Secondary | ICD-10-CM | POA: Diagnosis not present

## 2019-07-09 DIAGNOSIS — I700 Atherosclerosis of aorta: Secondary | ICD-10-CM | POA: Insufficient documentation

## 2019-07-09 DIAGNOSIS — J439 Emphysema, unspecified: Secondary | ICD-10-CM | POA: Insufficient documentation

## 2019-07-09 DIAGNOSIS — K3580 Unspecified acute appendicitis: Secondary | ICD-10-CM | POA: Insufficient documentation

## 2019-07-09 DIAGNOSIS — G4733 Obstructive sleep apnea (adult) (pediatric): Secondary | ICD-10-CM | POA: Diagnosis not present

## 2019-07-09 DIAGNOSIS — J9611 Chronic respiratory failure with hypoxia: Secondary | ICD-10-CM | POA: Diagnosis not present

## 2019-07-09 DIAGNOSIS — M1990 Unspecified osteoarthritis, unspecified site: Secondary | ICD-10-CM | POA: Insufficient documentation

## 2019-07-09 DIAGNOSIS — K219 Gastro-esophageal reflux disease without esophagitis: Secondary | ICD-10-CM | POA: Diagnosis not present

## 2019-07-09 HISTORY — DX: Type 2 diabetes mellitus with diabetic neuropathy, unspecified: E11.40

## 2019-07-09 HISTORY — DX: Unspecified cataract: H26.9

## 2019-07-09 HISTORY — DX: Presence of spectacles and contact lenses: Z97.3

## 2019-07-09 HISTORY — DX: Unspecified appendicitis: K37

## 2019-07-09 HISTORY — DX: Delusional disorders: F22

## 2019-07-09 HISTORY — PX: LAPAROSCOPIC APPENDECTOMY: SHX408

## 2019-07-09 HISTORY — DX: Presence of dental prosthetic device (complete) (partial): Z97.2

## 2019-07-09 LAB — CBC
HCT: 36.7 % (ref 36.0–46.0)
Hemoglobin: 11.7 g/dL — ABNORMAL LOW (ref 12.0–15.0)
MCH: 30.1 pg (ref 26.0–34.0)
MCHC: 31.9 g/dL (ref 30.0–36.0)
MCV: 94.3 fL (ref 80.0–100.0)
Platelets: 292 10*3/uL (ref 150–400)
RBC: 3.89 MIL/uL (ref 3.87–5.11)
RDW: 14.5 % (ref 11.5–15.5)
WBC: 8.4 10*3/uL (ref 4.0–10.5)
nRBC: 0 % (ref 0.0–0.2)

## 2019-07-09 LAB — BASIC METABOLIC PANEL
Anion gap: 12 (ref 5–15)
BUN: 15 mg/dL (ref 6–20)
CO2: 29 mmol/L (ref 22–32)
Calcium: 9.2 mg/dL (ref 8.9–10.3)
Chloride: 99 mmol/L (ref 98–111)
Creatinine, Ser: 1.16 mg/dL — ABNORMAL HIGH (ref 0.44–1.00)
GFR calc Af Amer: >60 mL/min (ref >60)
GFR calc non Af Amer: 52 mL/min — ABNORMAL LOW (ref >60)
Glucose, Bld: 181 mg/dL — ABNORMAL HIGH (ref 70–99)
Potassium: 2.9 mmol/L — ABNORMAL LOW (ref 3.5–5.1)
Sodium: 140 mmol/L (ref 135–145)

## 2019-07-09 LAB — GLUCOSE, CAPILLARY
Glucose-Capillary: 226 mg/dL — ABNORMAL HIGH (ref 70–99)
Glucose-Capillary: 174 mg/dL — ABNORMAL HIGH (ref 70–99)
Glucose-Capillary: 164 mg/dL — ABNORMAL HIGH (ref 70–99)

## 2019-07-09 SURGERY — APPENDECTOMY, LAPAROSCOPIC
Anesthesia: General | Site: Abdomen

## 2019-07-09 MED ORDER — MIDAZOLAM HCL 2 MG/2ML IJ SOLN
INTRAMUSCULAR | Status: AC
  Filled 2019-07-09: qty 2

## 2019-07-09 MED ORDER — ACETAMINOPHEN 500 MG PO TABS: 1000.0000 mg | ORAL_TABLET | ORAL | Status: AC

## 2019-07-09 MED ORDER — FENTANYL CITRATE (PF) 100 MCG/2ML IJ SOLN
25.0000 ug | INTRAMUSCULAR | Status: DC | PRN
  Administered 2019-07-09: 50 ug via INTRAVENOUS

## 2019-07-09 MED ORDER — CHLORHEXIDINE GLUCONATE CLOTH 2 % EX PADS: 6.0000 | MEDICATED_PAD | Freq: Once | CUTANEOUS | Status: DC

## 2019-07-09 MED ORDER — METRONIDAZOLE IN NACL 5-0.79 MG/ML-% IV SOLN
500.0000 mg | INTRAVENOUS | Status: AC
  Administered 2019-07-09: 500 mg via INTRAVENOUS

## 2019-07-09 MED ORDER — ROCURONIUM BROMIDE 10 MG/ML (PF) SYRINGE
PREFILLED_SYRINGE | INTRAVENOUS | Status: AC
  Filled 2019-07-09: qty 10

## 2019-07-09 MED ORDER — ROCURONIUM BROMIDE 10 MG/ML (PF) SYRINGE
PREFILLED_SYRINGE | INTRAVENOUS | Status: DC | PRN
  Administered 2019-07-09: 50 mg via INTRAVENOUS

## 2019-07-09 MED ORDER — LACTATED RINGERS IV SOLN: INTRAVENOUS | Status: DC | PRN

## 2019-07-09 MED ORDER — ACETAMINOPHEN 500 MG PO TABS
ORAL_TABLET | ORAL | Status: AC
  Administered 2019-07-09: 1000 mg via ORAL
  Filled 2019-07-09: qty 2

## 2019-07-09 MED ORDER — LIDOCAINE 2% (20 MG/ML) 5 ML SYRINGE
INTRAMUSCULAR | Status: AC
  Filled 2019-07-09: qty 5

## 2019-07-09 MED ORDER — TRAMADOL HCL 50 MG PO TABS: 50.0000 mg | ORAL_TABLET | Freq: Four times a day (QID) | ORAL | 0 refills | Status: DC | PRN

## 2019-07-09 MED ORDER — FENTANYL CITRATE (PF) 250 MCG/5ML IJ SOLN
INTRAMUSCULAR | Status: AC
  Filled 2019-07-09: qty 5

## 2019-07-09 MED ORDER — ALBUTEROL SULFATE HFA 108 (90 BASE) MCG/ACT IN AERS
INHALATION_SPRAY | RESPIRATORY_TRACT | Status: DC | PRN
Start: 2019-07-09 — End: 2019-07-09
  Administered 2019-07-09: 6 via RESPIRATORY_TRACT

## 2019-07-09 MED ORDER — PROMETHAZINE HCL 25 MG/ML IJ SOLN: 6.2500 mg | INTRAMUSCULAR | Status: DC | PRN

## 2019-07-09 MED ORDER — PHENYLEPHRINE 40 MCG/ML (10ML) SYRINGE FOR IV PUSH (FOR BLOOD PRESSURE SUPPORT)
PREFILLED_SYRINGE | INTRAVENOUS | Status: AC
  Filled 2019-07-09: qty 10

## 2019-07-09 MED ORDER — ALBUTEROL SULFATE HFA 108 (90 BASE) MCG/ACT IN AERS
INHALATION_SPRAY | RESPIRATORY_TRACT | Status: AC
  Filled 2019-07-09: qty 6.7

## 2019-07-09 MED ORDER — DEXAMETHASONE SODIUM PHOSPHATE 10 MG/ML IJ SOLN
INTRAMUSCULAR | Status: AC
  Filled 2019-07-09: qty 1

## 2019-07-09 MED ORDER — LACTATED RINGERS IV SOLN: INTRAVENOUS | Status: DC

## 2019-07-09 MED ORDER — LIDOCAINE 2% (20 MG/ML) 5 ML SYRINGE
INTRAMUSCULAR | Status: DC | PRN
  Administered 2019-07-09: 40 mg via INTRAVENOUS

## 2019-07-09 MED ORDER — MIDAZOLAM HCL 2 MG/2ML IJ SOLN
INTRAMUSCULAR | Status: DC | PRN
  Administered 2019-07-09: 2 mg via INTRAVENOUS

## 2019-07-09 MED ORDER — PROPOFOL 10 MG/ML IV BOLUS
INTRAVENOUS | Status: DC | PRN
  Administered 2019-07-09: 150 mg via INTRAVENOUS

## 2019-07-09 MED ORDER — METRONIDAZOLE IN NACL 5-0.79 MG/ML-% IV SOLN
INTRAVENOUS | Status: AC
  Filled 2019-07-09: qty 100

## 2019-07-09 MED ORDER — CIPROFLOXACIN IN D5W 400 MG/200ML IV SOLN
INTRAVENOUS | Status: AC
  Filled 2019-07-09: qty 200

## 2019-07-09 MED ORDER — 0.9 % SODIUM CHLORIDE (POUR BTL) OPTIME
TOPICAL | Status: DC | PRN
  Administered 2019-07-09: 1000 mL

## 2019-07-09 MED ORDER — PROPOFOL 10 MG/ML IV BOLUS
INTRAVENOUS | Status: AC
  Filled 2019-07-09: qty 20

## 2019-07-09 MED ORDER — CIPROFLOXACIN IN D5W 400 MG/200ML IV SOLN
400.0000 mg | INTRAVENOUS | Status: AC
  Administered 2019-07-09: 400 mg via INTRAVENOUS

## 2019-07-09 MED ORDER — BUPIVACAINE HCL (PF) 0.25 % IJ SOLN
INTRAMUSCULAR | Status: AC
  Filled 2019-07-09: qty 30

## 2019-07-09 MED ORDER — FENTANYL CITRATE (PF) 250 MCG/5ML IJ SOLN
INTRAMUSCULAR | Status: DC | PRN
  Administered 2019-07-09: 100 ug via INTRAVENOUS

## 2019-07-09 MED ORDER — DEXAMETHASONE SODIUM PHOSPHATE 10 MG/ML IJ SOLN
INTRAMUSCULAR | Status: AC
  Filled 2019-07-09: qty 2

## 2019-07-09 MED ORDER — BUPIVACAINE HCL 0.25 % IJ SOLN
INTRAMUSCULAR | Status: DC | PRN
  Administered 2019-07-09: 10 mL

## 2019-07-09 MED ORDER — SUGAMMADEX SODIUM 200 MG/2ML IV SOLN
INTRAVENOUS | Status: DC | PRN
  Administered 2019-07-09: 200 mg via INTRAVENOUS

## 2019-07-09 MED ORDER — ONDANSETRON HCL 4 MG/2ML IJ SOLN
INTRAMUSCULAR | Status: DC | PRN
  Administered 2019-07-09: 4 mg via INTRAVENOUS

## 2019-07-09 MED ORDER — FENTANYL CITRATE (PF) 100 MCG/2ML IJ SOLN
INTRAMUSCULAR | Status: AC
  Filled 2019-07-09: qty 2

## 2019-07-09 MED ORDER — ONDANSETRON HCL 4 MG/2ML IJ SOLN
INTRAMUSCULAR | Status: AC
  Filled 2019-07-09: qty 2

## 2019-07-09 MED ORDER — PHENYLEPHRINE 40 MCG/ML (10ML) SYRINGE FOR IV PUSH (FOR BLOOD PRESSURE SUPPORT)
PREFILLED_SYRINGE | INTRAVENOUS | Status: DC | PRN
  Administered 2019-07-09: 80 ug via INTRAVENOUS

## 2019-07-09 SURGICAL SUPPLY — 42 items
APPLIER CLIP 5 13 M/L LIGAMAX5 (MISCELLANEOUS)
BLADE CLIPPER SURG (BLADE) IMPLANT
CANISTER SUCT 3000ML PPV (MISCELLANEOUS) ×2 IMPLANT
CHLORAPREP W/TINT 26 (MISCELLANEOUS) ×2 IMPLANT
CLIP APPLIE 5 13 M/L LIGAMAX5 (MISCELLANEOUS) IMPLANT
CLIP VESOLOCK XL 6/CT (CLIP) ×2 IMPLANT
COVER SURGICAL LIGHT HANDLE (MISCELLANEOUS) ×2 IMPLANT
COVER TRANSDUCER ULTRASND (DRAPES) ×2 IMPLANT
COVER WAND RF STERILE (DRAPES) ×2 IMPLANT
DERMABOND ADVANCED (GAUZE/BANDAGES/DRESSINGS) ×1
DERMABOND ADVANCED .7 DNX12 (GAUZE/BANDAGES/DRESSINGS) ×1 IMPLANT
ELECT REM PT RETURN 9FT ADLT (ELECTROSURGICAL) ×2
ELECTRODE REM PT RTRN 9FT ADLT (ELECTROSURGICAL) ×1 IMPLANT
ENDOLOOP SUT PDS II  0 18 (SUTURE)
ENDOLOOP SUT PDS II 0 18 (SUTURE) IMPLANT
GLOVE BIO SURGEON STRL SZ7.5 (GLOVE) ×2 IMPLANT
GOWN STRL REUS W/ TWL LRG LVL3 (GOWN DISPOSABLE) ×2 IMPLANT
GOWN STRL REUS W/ TWL XL LVL3 (GOWN DISPOSABLE) ×1 IMPLANT
GOWN STRL REUS W/TWL LRG LVL3 (GOWN DISPOSABLE) ×2
GOWN STRL REUS W/TWL XL LVL3 (GOWN DISPOSABLE) ×1
GRASPER SUT TROCAR 14GX15 (MISCELLANEOUS) ×2 IMPLANT
KIT BASIN OR (CUSTOM PROCEDURE TRAY) ×2 IMPLANT
KIT TURNOVER KIT B (KITS) ×2 IMPLANT
NEEDLE INSUFFLATION 14GA 120MM (NEEDLE) ×2 IMPLANT
NS IRRIG 1000ML POUR BTL (IV SOLUTION) ×2 IMPLANT
PAD ARMBOARD 7.5X6 YLW CONV (MISCELLANEOUS) ×4 IMPLANT
POUCH SPECIMEN RETRIEVAL 10MM (ENDOMECHANICALS) ×2 IMPLANT
SCISSORS LAP 5X35 DISP (ENDOMECHANICALS) ×2 IMPLANT
SET IRRIG TUBING LAPAROSCOPIC (IRRIGATION / IRRIGATOR) ×2 IMPLANT
SET TUBE SMOKE EVAC HIGH FLOW (TUBING) ×2 IMPLANT
SLEEVE ENDOPATH XCEL 5M (ENDOMECHANICALS) ×2 IMPLANT
SPECIMEN JAR SMALL (MISCELLANEOUS) ×2 IMPLANT
SUT MNCRL AB 4-0 PS2 18 (SUTURE) ×2 IMPLANT
SUT PROLENE 0 CT (SUTURE) ×2 IMPLANT
TOWEL GREEN STERILE (TOWEL DISPOSABLE) ×2 IMPLANT
TOWEL GREEN STERILE FF (TOWEL DISPOSABLE) ×2 IMPLANT
TRAY FOLEY W/BAG SLVR 16FR (SET/KITS/TRAYS/PACK)
TRAY FOLEY W/BAG SLVR 16FR ST (SET/KITS/TRAYS/PACK) IMPLANT
TRAY LAPAROSCOPIC MC (CUSTOM PROCEDURE TRAY) ×2 IMPLANT
TROCAR XCEL NON-BLD 11X100MML (ENDOMECHANICALS) ×2 IMPLANT
TROCAR XCEL NON-BLD 5MMX100MML (ENDOMECHANICALS) ×2 IMPLANT
WATER STERILE IRR 1000ML POUR (IV SOLUTION) ×2 IMPLANT

## 2019-07-09 NOTE — Op Note (Signed)
07/09/2019  10:24 AM  PATIENT:  Joan [name]  59 y.o. female  PRE-OPERATIVE DIAGNOSIS:  APPENDICITIS  POST-OPERATIVE DIAGNOSIS:  H/o APPENDICITIS and long appendiceal stump  PROCEDURE:  Procedure(s): APPENDECTOMY LAPAROSCOPIC (N/A)  SURGEON:  Surgeon(s) and Role:    * Ralene Ok, MD - Primary   ANESTHESIA:   local and general  EBL:  5 mL   BLOOD ADMINISTERED:none  DRAINS: none   LOCAL MEDICATIONS USED:  BUPIVICAINE   SPECIMEN:  Source of Specimen:  Appendix  DISPOSITION OF SPECIMEN:  PATHOLOGY  COUNTS:  YES  TOURNIQUET:  * No tourniquets in log *  DICTATION: .Dragon Dictation Complications: none  Counts: reported as correct x 2  Findings:  The patient had a noninflamed appendix, there was some inflammation to the right lower quadrant area.  The appendiceal base was able to be visualized and the appendiceal stump was removed.  Specimen: Appendix  Indications for procedure:  The patient is a 59 year old female with a history of periumbilical pain localized in the right lower quadrant patient had a CT scan which revealed signs consistent with acute appendicitis the patient back in for laparoscopic appendectomy.  Patient underwent lap appendectomy however there was a long appendiceal stump a follow-up CT scan.  Patient was counseled on clinic and decided to have the rest of her appendiceal stump removed.  Details of the procedure:The patient was taken back to the operating room. The patient was placed in supine position with bilateral SCDs in place.  The patient was prepped and draped in the usual sterile fashion.  After appropriate anitbiotics were confirmed, a time-out was confirmed and all facts were verified.    A pneumoperitoneum of 14 mmHg was obtained via a Veress needle technique in the left lower quadrant quadrant.  A 5 mm trocar and 5 mm camera then placed intra-abdominally there is no injury to any intra-abdominal organs a 11 mm infraumbilical port  was placed and direct visualization as was a 5 mm port in the suprapubic area.   The appendix was identified and seen to be noninflamed, there was some inflammation and small minimal adhesions to the right lower quadrant area.   The appendix was cleaned down to the appendiceal base. The mesoappendix was then incised and the appendiceal artery was cauterized.  The the appendiceal base was clean.  A gold hemoclip was placed proximallyx2 and one distally and the appendix was transected between these 2. A retrieval bag was then placed into the abdomen and the specimen placed in the bag. The appendiceal stump was cauterized. We evacuate the fluid from the pelvis until the effluent was clear.  The appendix and retrieval  bag was then retrieved via the supraumbilical port.  #0 Prolene was used to reapproximate the fascia at the umbilical port site x1. The skin was reapproximated all port sites 3-0 Monocryl subcuticular fashion. The skin was dressed with Dermabond.  The patient had the foley removed. The patient was awakened from general anesthesia was taken to recovery room in stable condition.     PLAN OF CARE: Discharge to home after PACU  PATIENT DISPOSITION:  PACU - hemodynamically stable.   Delay start of Pharmacological VTE agent (>24hrs) due to surgical blood loss or risk of bleeding: not applicable

## 2019-07-09 NOTE — Anesthesia Postprocedure Evaluation (Signed)
Anesthesia Post Note  Patient: Joan [name]  Procedure(s) Performed: APPENDECTOMY LAPAROSCOPIC (N/A Abdomen)     Patient location during evaluation: PACU Anesthesia Type: General Level of consciousness: awake and alert Pain management: pain level controlled Vital Signs Assessment: post-procedure vital signs reviewed and stable Respiratory status: spontaneous breathing, nonlabored ventilation, respiratory function stable and patient connected to nasal cannula oxygen Cardiovascular status: blood pressure returned to baseline and stable Postop Assessment: no apparent nausea or vomiting Anesthetic complications: no    Last Vitals:  Vitals:   07/09/19 1200 07/09/19 1230  BP: 105/68 95/76  Pulse: 63 66  Resp: 13 14  Temp:    SpO2: 94% 92%    Last Pain:  Vitals:   07/09/19 1200  TempSrc:   PainSc: [place]

## 2019-07-09 NOTE — Transfer of Care (Signed)
Immediate Anesthesia Transfer of Care Note  Patient: Joan [name]  Procedure(s) Performed: APPENDECTOMY LAPAROSCOPIC (N/A Abdomen)  Patient Location: PACU  Anesthesia Type:General  Level of Consciousness: awake, alert  and oriented  Airway & Oxygen Therapy: Patient Spontanous Breathing and Patient connected to face mask oxygen  Post-op Assessment: Report given to RN and Post -op Vital signs reviewed and stable  Post vital signs: Reviewed and stable  Last Vitals:  Vitals Value Taken Time  BP 130/77 07/09/19 1034  Temp    Pulse 73 07/09/19 1039  Resp 18 07/09/19 1039  SpO2 99 % 07/09/19 1039  Vitals shown include unvalidated device data.  Last Pain:  Vitals:   07/09/19 1035  TempSrc:   PainSc: (P) Asleep         Complications: No apparent anesthesia complications

## 2019-07-09 NOTE — Discharge Instructions (Signed)
Laparoscopic Appendectomy, Adult, Care After This sheet gives you information about how to care for yourself after your procedure. Your doctor may also give you more specific instructions. If you have problems or questions, contact your doctor. What can I expect after the procedure? After the procedure, it is common to have:  Little energy for normal activities.  Mild pain in the area where the cuts from surgery (incisions) were made.  Trouble pooping (constipation). This can be caused by: ? Pain medicine. ? A lack of activity. Follow these instructions at home: Medicines  Take over-the-counter and prescription medicines only as told by your doctor.  If you were prescribed an antibiotic medicine, take it as told by your doctor. Do not stop taking it even if you start to feel better.  Do not drive or use heavy machinery while taking prescription pain medicine.  Ask your doctor if the medicine you are taking can cause trouble pooping. You may need to take steps to prevent or treat trouble pooping: ? Drink enough fluid to keep your pee (urine) pale yellow. ? Take over-the-counter or prescription medicines. ? Eat foods that are high in fiber. These include beans, whole grains, and fresh fruits and vegetables. ? Limit foods that are high in fat and sugar. These include fried or sweet foods. Incision care   Follow instructions from your doctor about how to take care of your cuts from surgery. Make sure you: ? Wash your hands with soap and water before and after you change your bandage (dressing). If you cannot use soap and water, use hand sanitizer. ? Change your bandage as told by your doctor. ? Leave stitches (sutures), skin glue, or skin tape (adhesive) strips in place. They may need to stay in place for 2 weeks or longer. If tape strips get loose and curl up, you may trim the loose edges. Do not remove tape strips completely unless your doctor says it is okay.  Check your cuts from  surgery every day for signs of infection. Check for: ? Redness, swelling, or pain. ? Fluid or blood. ? Warmth. ? Pus or a bad smell. Bathing  Keep your cuts from surgery clean and dry. Clean them as told by your doctor. To do this: 1. Gently wash the cuts with soap and water. 2. Rinse the cuts with water to remove all soap. 3. Pat the cuts dry with a clean towel. Do not rub the cuts.  Do not take baths, swim, or use a hot tub for 2 weeks, or until your doctor says it is okay. You may take showers after 48 hours. Activity   Do not drive for 24 hours if you were given a medicine to help you relax (sedative) during your procedure.  Rest after the procedure. Return to your normal activities as told by your doctor. Ask your doctor what activities are safe for you.  For 3 weeks, or for as long as told by your doctor: ? Do not lift anything that is heavier than 10 lb (4.5 kg), or the limit that you are told. ? Do not play contact sports. General instructions  If you were sent home with a drain, follow instructions from your doctor on how to care for it.  Take deep breaths. This helps to keep your lungs from getting an infection (pneumonia).  Keep all follow-up visits as told by your doctor. This is important. Contact a doctor if:  You have redness, swelling, or pain around a cut from surgery.    You have fluid or blood coming from a cut.  Your cut feels warm to the touch.  You have pus or a bad smell coming from a cut or a bandage.  The edges of a cut break open after the stitches have been taken out.  You have pain in your shoulders that gets worse.  You feel dizzy or you pass out (faint).  You have shortness of breath.  You keep feeling sick to your stomach (nauseous).  You keep throwing up (vomiting).  You get watery poop (diarrhea) or you cannot control your poop.  You lose your appetite.  You have swelling or pain in your legs.  You get a rash. Get help right  away if:  You have a fever.  You have trouble breathing.  You have sharp pains in your chest. Summary  After the procedure, it is common to have low energy, mild pain, and trouble pooping.  Infection is a common problem after this procedure. Follow your doctor's instructions about caring for yourself after the procedure.  Rest after the procedure. Return to your normal activities as told by your doctor.  Contact your doctor if you see signs of infection around your cuts from surgery, or you get short of breath. Get help right away if you have a fever, chest pain, or trouble breathing. This information is not intended to replace advice given to you by your health care provider. Make sure you discuss any questions you have with your health care provider. Document Revised: 08/14/2017 Document Reviewed: 08/14/2017 Elsevier Patient Education  2020 [employer]  

## 2019-07-09 NOTE — Anesthesia Procedure Notes (Signed)
Procedure Name: Intubation Date/Time: 07/09/2019 9:46 AM Performed by: Alain Marion, CRNA Pre-anesthesia Checklist: Patient identified, Emergency Drugs available, Suction available and Patient being monitored Patient Re-evaluated:Patient Re-evaluated prior to induction Oxygen Delivery Method: Circle System Utilized Preoxygenation: Pre-oxygenation with 100% oxygen Induction Type: IV induction Ventilation: Mask ventilation without difficulty Laryngoscope Size: Miller and 2 Grade View: Grade I Tube type: Oral Tube size: 7.0 mm Number of attempts: 1 Airway Equipment and Method: Stylet Placement Confirmation: ETT inserted through vocal cords under direct vision,  positive ETCO2 and breath sounds checked- equal and bilateral Secured at: 21 cm Tube secured with: Tape Dental Injury: Teeth and Oropharynx as per pre-operative assessment

## 2019-07-09 NOTE — Interval H&P Note (Signed)
History and Physical Interval Note:  07/09/2019 7:01 AM  Joan [name]  has presented today for surgery, with the diagnosis of APPENDICITIS.  The various methods of treatment have been discussed with the patient and family. After consideration of risks, benefits and other options for treatment, the patient has consented to  Procedure(s): APPENDECTOMY LAPAROSCOPIC (N/A) as a surgical intervention.  The patient's history has been reviewed, patient examined, no change in status, stable for surgery.  I have reviewed the patient's chart and labs.  Questions were answered to the patient's satisfaction.     Ralene Ok

## 2019-07-12 ENCOUNTER — Telehealth: Payer: Medicare Other

## 2019-07-12 LAB — SURGICAL PATHOLOGY

## 2019-07-16 ENCOUNTER — Ambulatory Visit (INDEPENDENT_AMBULATORY_CARE_PROVIDER_SITE_OTHER): Payer: Medicare Other | Admitting: Licensed Clinical Social Worker

## 2019-07-16 DIAGNOSIS — F0630 Mood disorder due to known physiological condition, unspecified: Secondary | ICD-10-CM

## 2019-07-16 DIAGNOSIS — R443 Hallucinations, unspecified: Secondary | ICD-10-CM

## 2019-07-16 DIAGNOSIS — F4312 Post-traumatic stress disorder, chronic: Secondary | ICD-10-CM | POA: Diagnosis not present

## 2019-07-16 DIAGNOSIS — F411 Generalized anxiety disorder: Secondary | ICD-10-CM

## 2019-07-16 NOTE — Progress Notes (Signed)
Virtual Visit via Telephone Note  I connected with Joan [name] on 07/16/19 at 10:00 AM EDT by telephone and verified that I am speaking with the correct person using two identifiers.   I discussed the limitations, risks, security and privacy concerns of performing an evaluation and management service by telephone and the availability of in person appointments. I also discussed with the patient that there may be a patient responsible charge related to this service. The patient expressed understanding and agreed to proceed.   I discussed the assessment and treatment plan with the patient. The patient was provided an opportunity to ask questions and all were answered. The patient agreed with the plan and demonstrated an understanding of the instructions.   The patient was advised to call back or seek an in-person evaluation if the symptoms worsen or if the condition fails to improve as anticipated.  I provided 55 minutes of non-face-to-face time during this encounter.  THERAPIST PROGRESS NOTE  Session Time: 10:00 AM to 10:55 AM  Participation Level: Active  Behavioral Response: CasualAlertEuthymic and subdued/tired  Type of Therapy: Individual Therapy  Treatment Goals addressed: PTSD, coping, hallucinations  Interventions: Solution Focused, Strength-based, Supportive and Other: coping  Summary: Joan [name] is a 59 y.o. female who presents with had her surgery and it was a success, got the rest appendix out. Very tired though. Discussed surgery was a week ago and still recovering and doctor has told her to give herself 6-8 weeks to heal. No energy do housework. Shares that if she sleeps and wakes up on her own she feels better although most of the day is gone. She has not wanted to do something since surgery. Doesn't even want to sit out on the sun. Not depressed. Will give it some time to see what this is and if is related to healing from surgery. Shares past issue related to  something related to her metabolism not working right, possibly not producing sugar sludge in her system that had to be taken out. Wait to see if things clear and if not look at other possibilities such as metabolism issues like before.  Stomach hurts still sore. Has pain but different than before as it is part of healing process now, before it was part of disease. Not having problems now going to restroom. In finding source for being tried relates that body been through too much. Sick since January in and out surgery. She had hip surgery, two weeks after popped it. Therapist also pointed out she has underlying conditions wearing her body down. Shares has a hard time catching her breath, hard time walking from bed to chair. Better this morning after not using oxygen with C-PAP. Shares usually in the morning needs to catch her breath.  Once sits uses her inhalers seem to be ok. Discussed another argument with brother. Cordial with him otherwise don't talk. He presents a distorted view that he does everything for her and she doesn't do anything.  We will catch him lying about things such as help fight started and she will call them out on it and she also make sure to let them know she will call the police if she needs to.  Discussed this ongoing stress may be difficult, patient got information on case management another possibility is her daughter, Joan [name], has told her she plans to buy a house and patient could come and stay with her. daughter buying a house and wants her to live with her. With everything going  on personally with her patient admits too much with him. Stays in her room. Memory issues getting worse concern her. Drove her car got confused a little scary. Patient also shared doesn't drive much so that could be a factor in having difficulty.     Suicidal/Homicidal: No  Therapist Response: Reviewed symptoms, facilitated expression of thoughts and feelings, assess things moving in a positive direction  for patient as she is postoperative.  Explored reasons for being tired at this point appeared to be related to just having surgery but also this year has had other surgery plus has underlying conditions.  After patient gives herself time to heal she can reevaluate if need for further evaluation with Dr.  Work with patient on stress management of living environment where there is conflict with brother, pointed out positive approach of minimizing opportunities for negative interaction both by removing herself and being cordial.  Discussed helpfulness patient has other options that are possible if needed such as possibility of moving in with daughter if she buys a house.  Discussed physical stressors discussed such as chronic COPD that impact functioning and discussed both positive step patient took in quitting smoking and also other options possible to pursue but at this point priority and focuses on allowing herself to heal from surgery.  Therapist provided strength based and supportive intervention.  Plan: Return again in 2 weeks.2.Therapist work with patient on stress management, emotional regulation skills  Diagnosis: Axis I:   PTSD, hallucinations, GAD, Mood Disorder in conditions classified elsewhere   Axis II: No diagnosis    [name], LCSW 07/16/2019

## 2019-07-19 ENCOUNTER — Telehealth (INDEPENDENT_AMBULATORY_CARE_PROVIDER_SITE_OTHER): Payer: Medicare Other | Admitting: Psychiatry

## 2019-07-19 ENCOUNTER — Encounter (HOSPITAL_COMMUNITY): Payer: Self-pay | Admitting: Psychiatry

## 2019-07-19 DIAGNOSIS — F0630 Mood disorder due to known physiological condition, unspecified: Secondary | ICD-10-CM

## 2019-07-19 DIAGNOSIS — F411 Generalized anxiety disorder: Secondary | ICD-10-CM | POA: Diagnosis not present

## 2019-07-19 MED ORDER — QUETIAPINE FUMARATE 50 MG PO TABS: 50.0000 mg | ORAL_TABLET | Freq: Every day | ORAL | 0 refills | Status: DC

## 2019-07-19 NOTE — Chronic Care Management (AMB) (Signed)
Chronic Care Management Pharmacy  Name: Joan [name]  MRN: [id number] DOB: 1960/03/17  Chief Complaint/ HPI  Joan [name],  59 y.o. , female presents for their Follow-Up CCM visit with the clinical pharmacist via telephone due to COVID-19 Pandemic.  PCP : [name], MD  Their chronic conditions include: HTN, HLD COPD, GERD, DM, hypothyroidism, arthritis, anxiety/depression  Office Visits: 06/16/19 Dr Sharlet Salina OV: head pain, allergy, nerve pain. Referred to allergy for possible allergy shots. Ordered CT head/neck. Rx'd Lyrica 75 mg BID. Still needs repeat surgery for appendicitis.   04/29/19 Dr Sharlet Salina OV: hospital f/u (appendecitis, unsuccessful appendectomy). Constipation worse, has f/u with surgeon.  Consult Visit: 07/19/19 Dr De Nurse (behavioral health): increased seroquel to 50 mg.  07/09/19 Hospital admission: appendectomy   05/11/19 Dr Dellis Filbert (OB/GYN): normal PAP. Continue Vitamin D and calcium, schedule BMD.   05/10/19 Dr De Nurse (psych): pt experiencing hallucinations, may be grief, PTSD, schizoaffective. Reduced seroquel to 25 mg at pt request.   05/05/19 ED visit: chest pain, workup WNL, GI cocktail improved pain.  03/30/19-04/14/19 hospital admission: acute appendicitis w/ ? Perforation  03/17/19 NP Groce (pulmonary): decrease Daliresp to 250 mcg daily, monitor LFTs. Quit smoking with Chantix.   02/13/19 ED visit: dislocated L hip.   Medications: Outpatient Encounter Medications as of 07/20/2019  Medication Sig Note  . acetaminophen (TYLENOL) 500 MG tablet Take 2 tablets (1,000 mg total) by mouth every 8 (eight) hours as needed for mild pain.   Marland Kitchen acyclovir (ZOVIRAX) 400 MG tablet Take 1 tablet (400 mg total) by mouth 2 (two) times daily. (Patient taking differently: Take 400 mg by mouth daily. ) 07/06/2019: On going  . albuterol (PROVENTIL) (2.5 MG/3ML) 0.083% nebulizer solution Take 3 mLs (2.5 mg total) by nebulization 2 (two) times daily. And every 6  hours as needed.  DX: J44.9 (Patient taking differently: Take 2.5 mg by nebulization in the morning and at bedtime. )   . atenolol (TENORMIN) 25 MG tablet TAKE 1 TABLET BY MOUTH  DAILY (Patient taking differently: Take 25 mg by mouth daily. )   . atorvastatin (LIPITOR) 20 MG tablet TAKE 1 TABLET BY MOUTH  DAILY (Patient taking differently: Take 20 mg by mouth daily. )   . budesonide-formoterol (SYMBICORT) 160-4.5 MCG/ACT inhaler Inhale 2 puffs into the lungs 2 (two) times daily.   Marland Kitchen buPROPion (WELLBUTRIN XL) 300 MG 24 hr tablet Take 300 mg by mouth daily.   . cyclobenzaprine (FLEXERIL) 10 MG tablet TAKE 1 TABLET BY MOUTH AT  BEDTIME (Patient taking differently: Take 10 mg by mouth at bedtime. )   . diphenhydrAMINE (BENADRYL) 25 MG tablet Take 25 mg by mouth 2 (two) times daily.    Marland Kitchen docusate sodium (COLACE) 100 MG capsule Take 1 capsule (100 mg total) by mouth 2 (two) times daily. (Patient taking differently: Take 100 mg by mouth at bedtime. )   . hydrochlorothiazide (HYDRODIURIL) 25 MG tablet TAKE 1 TABLET BY MOUTH  DAILY (Patient taking differently: Take 25 mg by mouth daily. )   . Ipratropium-Albuterol (COMBIVENT RESPIMAT) 20-100 MCG/ACT AERS respimat Inhale 1 puff into the lungs every 6 (six) hours as needed for wheezing. (Patient taking differently: Inhale 1 puff into the lungs in the morning and at bedtime. Take additional puff if needed at bedtime)   . levothyroxine (SYNTHROID) 25 MCG tablet TAKE 1 TABLET BY MOUTH  DAILY BEFORE BREAKFAST (Patient taking differently: Take 25 mcg by mouth daily. )   . Liniments (SALONPAS ARTHRITIS PAIN RELIEF  EX) Apply 1 patch topically daily as needed (to painful sites- remove as directed).    . Melatonin 10 MG TABS Take 10 mg by mouth at bedtime.   . montelukast (SINGULAIR) 10 MG tablet TAKE 1 TABLET BY MOUTH AT  BEDTIME (Patient taking differently: Take 10 mg by mouth at bedtime. )   . Multiple Vitamins-Minerals (ADULT ONE DAILY GUMMIES) CHEW Chew 1 tablet by  mouth daily.    . naproxen (NAPROSYN) 500 MG tablet Take 500 mg by mouth daily as needed for mild pain.    Marland Kitchen nystatin-triamcinolone ointment (MYCOLOG) Apply 1 application topically 2 (two) times daily as needed (applied to skin folds/groins/under breast skin irritation rash.). (Patient taking differently: Apply 1 application topically 2 (two) times daily as needed (to skin folds/groin/under breast skin- for irritation/rashes). )   . OXYGEN Inhale 2-3 L/min into the lungs See admin instructions. Inhale  2-3 L/min of oxygen into the lungs w/CPAP at bedtime and as needed for shortness of breath with "strenuous activity" during the day   . pantoprazole (PROTONIX) 40 MG tablet TAKE 1 TABLET BY MOUTH  DAILY (Patient taking differently: Take 40 mg by mouth daily. )   . polyethylene glycol (MIRALAX / GLYCOLAX) 17 g packet Take 17 g by mouth daily as needed for mild constipation.   . pregabalin (LYRICA) 75 MG capsule Take 1 capsule (75 mg total) by mouth 2 (two) times daily. (Patient taking differently: Take 150 mg by mouth at bedtime. )   . QUEtiapine (SEROQUEL) 50 MG tablet Take 1 tablet (50 mg total) by mouth at bedtime. Dose changed to 50mg  from 25mg    . roflumilast (DALIRESP) 500 MCG TABS tablet Take 1 tablet (500 mcg total) by mouth daily.   . traMADol (ULTRAM) 50 MG tablet Take 1 tablet (50 mg total) by mouth every 6 (six) hours as needed.   . traZODone (DESYREL) 100 MG tablet TAKE 1 TABLET BY MOUTH AT  BEDTIME (Patient taking differently: Take 100 mg by mouth at bedtime. )   . venlafaxine XR (EFFEXOR-XR) 150 MG 24 hr capsule TAKE 1 CAPSULE BY MOUTH  DAILY WITH BREAKFAST (Patient taking differently: Take 150 mg by mouth daily. )   . [DISCONTINUED] QUEtiapine (SEROQUEL) 25 MG tablet Take 1 tablet (25 mg total) by mouth at bedtime.    No facility-administered encounter medications on file as of 07/20/2019.    Current Diagnosis/Assessment:  SDOH Interventions     Most Recent Value  SDOH Interventions    Housing Interventions  [id number] Referral      Goals Addressed            This Visit's Progress   . Pharmacy Care Plan       CARE PLAN ENTRY  Current Barriers:  . Chronic Disease Management support, education, and care coordination needs related to Hypertension, Hyperlipidemia, Diabetes, and COPD   Hypertension . Pharmacist Clinical Goal(s): o Over the next 90 days, patient will work with PharmD and providers to maintain BP goal <130/80 . Current regimen:  o atenolol 25 mg daily, o HCTZ 25 mg daily . Interventions: o Discussed BP goal and benefits of medications . Patient self care activities - Over the next 90 days, patient will: o Check BP if dizzy, document, and provide at future appointments o Ensure daily salt intake < 2300 mg/day  Hyperlipidemia Lipid Panel     Component Value Date/Time   CHOL 125 12/17/2018 1402   TRIG 261.0 (H) 12/17/2018 1402   HDL 37.80 (L)  12/17/2018 1402   LDLDIRECT 56.0 12/17/2018 1402 .  Pharmacist Clinical Goal(s): o Over the next 90 days, patient will work with PharmD and providers to maintain LDL goal < 70 . Current regimen:  o Atorvastatin 20 mg daily . Interventions: o Discussed cholesterol goals and benefits of atorvastatin for prevention of heart attack and stroke . Patient self care activities - Over the next 90 days, patient will: o Continue medication as prescribed o Continue low cholesterol diet  Diabetes . Pharmacist Clinical Goal(s): o Over the next 90 days, patient will work with PharmD and providers to maintain A1c goal <6.5% . Current regimen:  o No medications . Interventions: o Discussed importance of diet and exercise to prevent diabetes progression . Patient self care activities - Over the next 90 days, patient will: o Continue heart healthy diet (see attached)  COPD . Pharmacist Clinical Goal(s) o Over the next 90 days, patient will work with PharmD and providers to optimize therapy and reduce  costs . Current regimen:  o Symbicort 160/4.5 - 2 puff BID,  o Combivent Respimat 1 puff q6h prn,  o Daliresp 500 mcg daily,  o montelukast 10 mg HS,  o albuterol nebulized PRN . Interventions: o Pursue patient assistance for Combivent via [employer] . Patient self care activities - Over the next 90 days, patient will: o Complete patient portion of [employer] application and mail to company  Medication management . Pharmacist Clinical Goal(s): o Over the next 90 days, patient will work with PharmD and providers to achieve optimal medication adherence . Current pharmacy: [facility] mail order . Interventions o Comprehensive medication review performed. o Continue current medication management strategy . Patient self care activities - Over the next 90 days, patient will: o Focus on medication adherence by fill date o Take medications as prescribed o Utilize $40/quarter OTC allowance from [facility] o Report any questions or concerns to PharmD and/or provider(s)  Initial goal documentation      COPD / Asthma / Tobacco   Last spirometry score: FEV1 43% predicted, FEV1/FVC 0.47  Gold Grade: Gold 3 (FEV1 30-49%) Current COPD Classification:  D (high sx, >/=2 exacerbations/yr)  Eosinophil count:   Lab Results  Component Value Date/Time   EOSPCT 3 02/13/2019 02:09 PM  %                               Eos (Absolute):  Lab Results  Component Value Date/Time   EOSABS 0.3 02/13/2019 02:09 PM   Tobacco Status:  Social History   Tobacco Use  Smoking Status Former Smoker  . Packs/day: 1.00  . Years: 44.00  . Pack years: 44.00  . Types: Cigarettes  . Quit date: 11/30/2018  . Years since quitting: 0.6  Smokeless Tobacco Never Used  Tobacco Comment   Smoker 1-1.5 PPD every day, 11-20-2018 "i got myself down to half a pack", i am gonna be going on chantix on the 28th"    Patient has failed these meds in past: Atrovent, Duoneb, Xoponex, Dulera, Spiriva, Incruse Patient is currently  controlled on the following medications:   Symbicort 160/4.5 - 2 puff BID,   Combivent 1 puff q6h prn,   roflumilast 500 mcg daily,   montelukast 10 mg HS,   albuterol nebs  Using maintenance inhaler regularly? Yes Frequency of rescue inhaler use:  daily  We discussed:  proper inhaler technique; pt is using albuterol nebs and Symbicort twice a day,  breathing is stable. Symbicort and Daliresp obtained through P[facility]. Spoke with [employer] represetnative who reports patient has been approved and 90-day refill is being sent today.  Plan  Continue current medications    Hypertension   Office blood pressures are  BP Readings from Last 3 Encounters:  07/09/19 95/76  06/16/19 112/62  06/15/19 120/76   Patient has failed these meds in the past: metoprolol Patient is currently controlled on the following medications:   atenolol 25 mg daily,  HCTZ 25 mg daily  Patient checks BP at home infrequently  Patient home BP readings are ranging: n/a  We discussed diet and exercise extensively, BP goals, importance of compliance with meds and monitoring BP at home.  Plan  Continue current medications and control with diet and exercise  Recommended to monitor BP 1-2 times weekly at home    Hyperlipidemia   Lipid Panel     Component Value Date/Time   CHOL 125 12/17/2018 1402   CHOL 134 08/19/2017 0822   TRIG 261.0 (H) 12/17/2018 1402   HDL 37.80 (L) 12/17/2018 1402   HDL 43 08/19/2017 0822   CHOLHDL 3 12/17/2018 1402   VLDL 52.2 (H) 12/17/2018 1402   LDLCALC 62 08/19/2017 0822   LDLDIRECT 56.0 12/17/2018 1402   LABVLDL 29 08/19/2017 0822     The ASCVD Risk score (Goff DC Jr., et al., 2013) failed to calculate for the following reasons:   The valid total cholesterol range is 130 to 320 mg/dL   Patient has failed these meds in past: n/a Patient is currently controlled on the following medications:   atorvastatin 20 mg daily  We discussed:  Cholesterol goals, benefits of  statins, diet changes to improve triglycerides.  Plan  Continue current medications and control with diet and exercise   Diabetes   Recent Relevant Labs: Lab Results  Component Value Date/Time   HGBA1C 6.2 (H) 03/31/2019 04:17 AM   HGBA1C 6.7 (H) 12/17/2018 02:02 PM   GFR 54.52 (L) 04/29/2019 04:00 PM   GFR 54.66 (L) 08/05/2017 04:52 PM   MICROALBUR <0.7 12/17/2018 02:02 PM    No medication indicated - diet-controlled.  Patient has failed these meds in past: Lantus, Humalog Patient is currently controlled on the following medications: no meds  We discussed: diet and exercise extensively  Plan  Continue control with diet and exercise   Hypothyroidism   TSH  Date Value Ref Range Status  03/30/2019 1.156 0.350 - 4.500 uIU/mL Final    Patient has failed these meds in past: n/a Patient is currently controlled on the following medications:   levothyroxine 25 mcg daily  We discussed:  Pt endorses compliance with medication, does not report s/sx of hypothyroidism.  Plan  Continue current medications   GERD/constipation   Patient has failed these meds in past: n/a Patient is currently controlled on the following medications:   pantoprazole 40 mg BID,   Miralax PRN  docusate 100 mg BID  We discussed:  Pt reports she just increased PPI to twice a day last week due to uncontrolled GERD, she has not noticed much improvement yet. Constipation is currently relatively controlled with docusate alone, only uses Miralax prn.  Plan  Continue current medications   Arthritis/Pain   Patient has failed these meds in past: gabapentin Patient is currently controlled on the following medications:   Tylenol 1000 mg q8h prn,   naproxen 500 mg BID prn,   Pregabalin 75 mg BID  cyclobenzaprine 10 mg HS,   Salonpas  patch  We discussed:  Pt switched to pregabalin last month and feels it is working much better.  Plan  Continue current medications    Anxiety/depression   PHQ9 SCORE ONLY 05/24/2019 12/18/2018 11/18/2018  PHQ-9 Total Score 13 3 1    Patient has failed these meds in past: Abilify,  Patient is currently controlled on the following medications:   bupropion 300 mg,   venlafaxine 150 mg qAM,   quetiapine 50 mg HS,   trazodone 100 mg HS  We discussed:  Seroquel was increased yesterday by Dr Awilda Bill, pt is hopeful it will help with "head feeling wrong" and cognitive difficulties. She does report worsening memory problems, she will stop in the middle of a task and forget what she was doing. She did discuss with psychiatrist yesterday, he encouraged her to f/u with therapist as usual and try increased dose of seroquel. Of note CT head this month was unremarkable.  Plan  Continue current medications   Health Maintenance   Patient is currently controlled on the following medications:   melatonin,   multivitamin,   Benadryl 25 mg BID  We discussed:  Pt has appt with allergist next week  Plan  Continue current medications   Medication Management   Pt uses [facility] mail order and [facility] for all medications Uses pill box Pt endorses compliance  We discussed: Patient is satisfied with current medication strategy, denies going without needed medications. Encouraged her to take advantage of $40/quarter OTC allowance through [facility].  Patient assistance: Symbicort - [place] Daliresp - [place] Combivent - [employer] (approved through 03/2020)  Plan  Continue current medication management strategy      Follow up: 3 month phone visit  Charlene Brooke, PharmD Clinical Pharmacist [facility] [phone]

## 2019-07-19 NOTE — Progress Notes (Signed)
[place] Follow up visit  Patient Identification: Joan [name] MRN:  [id number] Date of Evaluation:  07/19/2019 Referral Source:Therapist [name] Chief Complaint:    depression follow up  Visit Diagnosis:    ICD-10-CM   1. Mood disorder in conditions classified elsewhere  F06.30   2. GAD (generalized anxiety disorder)  F41.1      I connected with [name] on 07/19/19 at  3:00 PM EDT by telephone and verified that I am speaking with the correct person using two identifiers.       I discussed the limitations of evaluation and management by telemedicine and the availability of in person appointments. The patient expressed understanding and agreed to proceed.  History of Present Illness: Patient is a 59 years old currently widow Caucasian female referred by her therapist for management of mood symptoms, depression possible PTSD and anxiety  Not seeing shadows,  Feels difficult to handle stress with arguments from her brother and wants to change seroquel back to 50mg  No tremors descirbed   Has sufferred from grief and trauma and working in therapy with Stanton Kidney   Aggravating factor; difficult childhood, Grief, difficult relationship with middle kid, hip replacement Modifying factor: siblings, but still has difficult time with them   Duration adult life Severity of depression: denies hopelessness Some hallucinations after husband death but not now      Past Psychiatric History: depression, drug use  Previous Psychotropic Medications: Yes   Substance Abuse History in the last 12 months:  No.  Consequences of Substance Abuse: history of extensive drug use uptil 8 years ago and have been in rehab programs  Past Medical History:  Past Medical History:  Diagnosis Date  . Acute respiratory failure with hypoxia ([place])   . Alcoholism and drug addiction in family   . Anxiety   . Appendicitis   . Arthritis    "knees, hips, lower back" (09/25/2016)  . Asthma   .  Bipolar disorder ([place])   . Chronic bronchitis ([place])    "all the time" (09/25/2016)  . Chronic leg pain    RLE  . Chronic lower back pain   . COPD (chronic obstructive pulmonary disease) ([place])   . Depression   . Diabetes mellitus without complication ([place])   . Diabetic neuropathy ([place])   . Early cataracts, bilateral   . Emphysema of lung ([place])   . GERD (gastroesophageal reflux disease)   . Headache   . Hepatitis C    "treated back in 2001-2002"  . High cholesterol   . Hypertension   . Hypothyroidism   . Incarcerated ventral hernia 04/04/2017  . On home oxygen therapy    "2L; at nighttime" (04/04/2017)  . OSA on CPAP    CPAP with Oxygen 2 liters  . Paranoid ([place])   . Pneumonia    "all the time" (09/25/2016)  . Positive PPD    with negative chest x ray  . Tachycardia    patient reports with exertion ; denies any other conditions   . Thyroid disease   . Wears glasses   . Wears partial dentures     Past Surgical History:  Procedure Laterality Date  . BACK SURGERY    . BIOPSY  11/24/2018   Procedure: BIOPSY;  Surgeon: Thornton Park, MD;  Location: [facility];  Service: Gastroenterology;;  . CARDIAC CATHETERIZATION    . COLONOSCOPY    . COLONOSCOPY WITH PROPOFOL N/A 11/24/2018   Procedure: COLONOSCOPY WITH PROPOFOL;  Surgeon: Thornton Park, MD;  Location: [facility];  Service: Gastroenterology;  Laterality: N/A;  . CYST EXCISION     removal of cyst in sinuses  . DILATION AND CURETTAGE OF UTERUS    . ESOPHAGOGASTRODUODENOSCOPY (EGD) WITH PROPOFOL N/A 11/24/2018   Procedure: ESOPHAGOGASTRODUODENOSCOPY (EGD) WITH PROPOFOL;  Surgeon: Thornton Park, MD;  [facility]: [facility];  Service: Gastroenterology;  Laterality: N/A;  . FOOT SURGERY Bilateral    bone removed from 4th and 5 th toes and put in pin then took pin out  . HERNIA REPAIR    . INCISION AND DRAINAGE  ~ 2014/2015   "removed mesh & infection"  . INCISIONAL HERNIA REPAIR N/A 04/04/2017   Procedure: LAPAROSCOPIC  INCISIONAL HERNIA REPAIR WITH MESH;  Surgeon: Fanny Skates, MD;  Location: [facility];  Service: General;  Laterality: N/A;  . LACERATION REPAIR Right    repair wrist artery from laceration from ice skate  . LAPAROSCOPIC APPENDECTOMY N/A 03/31/2019   Procedure: APPENDECTOMY LAPAROSCOPIC;  Surgeon: Ralene Ok, MD;  Location: [facility];  Service: General;  Laterality: N/A;  . LAPAROSCOPIC APPENDECTOMY N/A 07/09/2019   Procedure: APPENDECTOMY LAPAROSCOPIC;  Surgeon: Ralene Ok, MD;  Location: [facility];  Service: General;  Laterality: N/A;  . LAPAROSCOPIC INCISIONAL / UMBILICAL / [place]  04/04/2017   LAPAROSCOPIC INCISIONAL HERNIA REPAIR WITH MESH  . LIPOMA EXCISION Right    fattty lipoma in neck  . NASAL SEPTUM SURGERY    . POSTERIOR LUMBAR FUSION  2015/2016 X 2   "added rods and screws"  . TOTAL HIP ARTHROPLASTY Right 02/05/2017   Procedure: RIGHT TOTAL HIP ARTHROPLASTY;  Surgeon: [name], MD;  [facility]: [facility];  Service: Orthopedics;  Laterality: Right;  . TOTAL HIP ARTHROPLASTY Left 06/18/2017   Procedure: LEFT TOTAL HIP ARTHROPLASTY;  Surgeon: [name], MD;  Location[facility];  Service: Orthopedics;  Laterality: Left;  . TOTAL HIP REVISION Left 01/26/2019   Procedure: TOTAL HIP REVISION;  Surgeon: Paralee Cancel, MD;  Location: [facility];  Service: Orthopedics;  Laterality: Left;  2 hrs  . TUBAL LIGATION    . UMBILICAL HERNIA REPAIR  ~ 2013   w/mesh  . UPPER GI ENDOSCOPY      Family Psychiatric History: FAther : alcohol use  Family History:  Family History  Problem Relation Age of Onset  . Diabetes Mother   . Hypertension Mother   . Hypertension Father   . Cancer Father        lung    Social History:   Social History   Socioeconomic History  . Marital status: Widowed    Spouse name: Not on file  . Number of children: 3  . Years of education: Not on file  . Highest education level: Not on file  Occupational History  . Not on file  Tobacco Use   . Smoking status: Former Smoker    Packs/day: 1.00    Years: 44.00    Pack years: 44.00    Types: Cigarettes    Quit date: 11/30/2018    Years since quitting: 0.6  . Smokeless tobacco: Never Used  . Tobacco comment: Smoker 1-1.5 PPD every day, 11-20-2018 "i got myself down to half a pack", i am gonna be going on chantix on the 28th"  Substance and Sexual Activity  . Alcohol use: Not Currently  . Drug use: Not Currently    Types: "Crack" cocaine    Comment:  "nothing since 01/12/2012"  . Sexual activity: Not Currently    Partners: Male  Other Topics  Concern  . Not on file  Social History Narrative  . Not on file   Social Determinants of Health   Financial Resource Strain: Low Risk   . Difficulty of Paying Living Expenses: Not very hard  Food Insecurity:   . Worried About Charity fundraiser in the Last Year:   . Arboriculturist in the Last Year:   Transportation Needs:   . Film/video editor (Medical):   Marland Kitchen Lack of Transportation (Non-Medical):   Physical Activity:   . Days of Exercise per Week:   . Minutes of Exercise per Session:   Stress:   . Feeling of Stress :   Social Connections:   . Frequency of Communication with Friends and Family:   . Frequency of Social Gatherings with Friends and Family:   . Attends Religious Services:   . Active Member of Clubs or Organizations:   . Attends Archivist Meetings:   Marland Kitchen Marital Status:       Allergies:   Allergies  Allergen Reactions  . Keflex [Cephalexin] Rash and Other (See Comments)    Has tolerated amoxicillin since had keflex  . Prednisone Other (See Comments)    "counter reacts"  . Shellfish Allergy Shortness Of Breath, Nausea And Vomiting and Other (See Comments)    Stomach cramps  . Tetracyclines & Related Anaphylaxis, Swelling and Other (See Comments)    Throat swelling requiring hospitalization  . Dilaudid [Hydromorphone Hcl] Other (See Comments)    "Lethargy"  . Incruse Ellipta [Umeclidinium  Bromide] Nausea Only  . Tuberculin Tests Other (See Comments)    False postive    Metabolic Disorder Labs: Lab Results  Component Value Date   HGBA1C 6.2 (H) 03/31/2019   MPG 131.24 03/31/2019   MPG 154.2 05/22/2018   No results found for: PROLACTIN Lab Results  Component Value Date   CHOL 125 12/17/2018   TRIG 261.0 (H) 12/17/2018   HDL 37.80 (L) 12/17/2018   CHOLHDL 3 12/17/2018   VLDL 52.2 (H) 12/17/2018   LDLCALC 62 08/19/2017   Lab Results  Component Value Date   TSH 1.156 03/30/2019    Therapeutic Level Labs: No results found for: LITHIUM No results found for: CBMZ No results found for: VALPROATE  Current Medications: Current Outpatient Medications  Medication Sig Dispense Refill  . acetaminophen (TYLENOL) 500 MG tablet Take 2 tablets (1,000 mg total) by mouth every 8 (eight) hours as needed for mild pain. 30 tablet 0  . acyclovir (ZOVIRAX) 400 MG tablet Take 1 tablet (400 mg total) by mouth 2 (two) times daily. (Patient taking differently: Take 400 mg by mouth daily. ) 180 tablet 3  . albuterol (PROVENTIL) (2.5 MG/3ML) 0.083% nebulizer solution Take 3 mLs (2.5 mg total) by nebulization 2 (two) times daily. And every 6 hours as needed.  DX: J44.9 (Patient taking differently: Take 2.5 mg by nebulization in the morning and at bedtime. ) 360 mL 3  . atenolol (TENORMIN) 25 MG tablet TAKE 1 TABLET BY MOUTH  DAILY (Patient taking differently: Take 25 mg by mouth daily. ) 90 tablet 1  . atorvastatin (LIPITOR) 20 MG tablet TAKE 1 TABLET BY MOUTH  DAILY (Patient taking differently: Take 20 mg by mouth daily. ) 90 tablet 1  . budesonide-formoterol (SYMBICORT) 160-4.5 MCG/ACT inhaler Inhale 2 puffs into the lungs 2 (two) times daily. 2 Inhaler 0  . buPROPion (WELLBUTRIN XL) 300 MG 24 hr tablet Take 300 mg by mouth daily.    . cyclobenzaprine (  FLEXERIL) 10 MG tablet TAKE 1 TABLET BY MOUTH AT  BEDTIME (Patient taking differently: Take 10 mg by mouth at bedtime. ) 90 tablet 0  .  diphenhydrAMINE (BENADRYL) 25 MG tablet Take 25 mg by mouth 2 (two) times daily.     Marland Kitchen docusate sodium (COLACE) 100 MG capsule Take 1 capsule (100 mg total) by mouth 2 (two) times daily. (Patient taking differently: Take 100 mg by mouth at bedtime. ) 28 capsule 0  . hydrochlorothiazide (HYDRODIURIL) 25 MG tablet TAKE 1 TABLET BY MOUTH  DAILY (Patient taking differently: Take 25 mg by mouth daily. ) 90 tablet 1  . Ipratropium-Albuterol (COMBIVENT RESPIMAT) 20-100 MCG/ACT AERS respimat Inhale 1 puff into the lungs every 6 (six) hours as needed for wheezing. (Patient taking differently: Inhale 1 puff into the lungs in the morning and at bedtime. Take additional puff if needed at bedtime) 3 Inhaler 3  . levothyroxine (SYNTHROID) 25 MCG tablet TAKE 1 TABLET BY MOUTH  DAILY BEFORE BREAKFAST (Patient taking differently: Take 25 mcg by mouth daily. ) 90 tablet 1  . Liniments (SALONPAS ARTHRITIS PAIN RELIEF EX) Apply 1 patch topically daily as needed (to painful sites- remove as directed).     . Melatonin 10 MG TABS Take 10 mg by mouth at bedtime.    . montelukast (SINGULAIR) 10 MG tablet TAKE 1 TABLET BY MOUTH AT  BEDTIME (Patient taking differently: Take 10 mg by mouth at bedtime. ) 90 tablet 3  . Multiple Vitamins-Minerals (ADULT ONE DAILY GUMMIES) CHEW Chew 1 tablet by mouth daily.     . naproxen (NAPROSYN) 500 MG tablet Take 500 mg by mouth daily as needed for mild pain.     Marland Kitchen nystatin-triamcinolone ointment (MYCOLOG) Apply 1 application topically 2 (two) times daily as needed (applied to skin folds/groins/under breast skin irritation rash.). (Patient taking differently: Apply 1 application topically 2 (two) times daily as needed (to skin folds/groin/under breast skin- for irritation/rashes). ) 100 g 3  . OXYGEN Inhale 2-3 L/min into the lungs See admin instructions. Inhale  2-3 L/min of oxygen into the lungs w/CPAP at bedtime and as needed for shortness of breath with "strenuous activity" during the day     . pantoprazole (PROTONIX) 40 MG tablet TAKE 1 TABLET BY MOUTH  DAILY (Patient taking differently: Take 40 mg by mouth daily. ) 90 tablet 1  . polyethylene glycol (MIRALAX / GLYCOLAX) 17 g packet Take 17 g by mouth daily as needed for mild constipation. 28 packet 0  . pregabalin (LYRICA) 75 MG capsule Take 1 capsule (75 mg total) by mouth 2 (two) times daily. (Patient taking differently: Take 150 mg by mouth at bedtime. ) 180 capsule 1  . QUEtiapine (SEROQUEL) 50 MG tablet Take 1 tablet (50 mg total) by mouth at bedtime. Dose changed to 50mg  from 25mg  30 tablet 0  . roflumilast (DALIRESP) 500 MCG TABS tablet Take 1 tablet (500 mcg total) by mouth daily. 90 tablet 3  . traMADol (ULTRAM) 50 MG tablet Take 1 tablet (50 mg total) by mouth every 6 (six) hours as needed. 20 tablet 0  . traZODone (DESYREL) 100 MG tablet TAKE 1 TABLET BY MOUTH AT  BEDTIME (Patient taking differently: Take 100 mg by mouth at bedtime. ) 90 tablet 3  . venlafaxine XR (EFFEXOR-XR) 150 MG 24 hr capsule TAKE 1 CAPSULE BY MOUTH  DAILY WITH BREAKFAST (Patient taking differently: Take 150 mg by mouth daily. ) 90 capsule 3   No current facility-administered medications for  this visit.     Psychiatric Specialty Exam: Review of Systems  Cardiovascular: Negative for chest pain.  Psychiatric/Behavioral: Negative for substance abuse and suicidal ideas.    There were no vitals taken for this visit.There is no height or weight on file to calculate BMI.  General Appearance:casual  Eye Contact:  fair  Speech:  Slow  Volume:  Decreased  Mood: fair  Affect:  Congruent  Thought Process:  Goal Directed  Orientation:  Full (Time, Place, and Person)  Thought Content:  rational  Suicidal Thoughts:  No  Homicidal Thoughts:  No  Memory:  Immediate;   Fair Recent;   Fair  Judgement:  Fair  Insight:  Shallow  Psychomotor Activity:  Decreased  Concentration:  Concentration: Fair and Attention Span: Fair  Recall:  [employer] of  Knowledge:Fair  Language: Fair  Akathisia:  No  Handed:    AIMS (if indicated): no involuntary movements  Assets:  Desire for Improvement Leisure Time Social Support  ADL's: limited due to recent surgery  Cognition: WNL  Sleep:  variable   Screenings: Mini-Mental     Clinical Support from 04/06/2018 in [facility]  Total Score (max 30 points )  29    PHQ2-9     Clinical Support from 05/24/2019 in [facility] at [facility] Patient Outreach Telephone from 12/18/2018 in [facility] Patient Outreach Telephone from 11/18/2018 in [facility] from 04/06/2018 in [facility] Visit from 11/28/2016 in [facility]  PHQ-2 Total Score  2  3  1  2  1   PHQ-9 Total Score  13  --  --  6  --      Assessment and Plan: as follows  Mood disorder unspecified: relavant to multiple medical concerns and past history of using drugs, alcohol Recurrent depressive episodes; fair continue meds including wellbutrin, effexor Hallucinations may be part of Grief, PTSD or depression or possible part of schizoaffective disorder. Not enorsing now but feels seroquel helps better with 50mg  in dealing with mood and her brother, will increase to 50mg  Also takes trazadone if needed Risk side effects discussed Continue therapy to work on conflicts and mood     I discussed the assessment and treatment plan with the patient. The patient was provided an opportunity to ask questions and all were answered. The patient agreed with the plan and demonstrated an understanding of the instructions.   The patient was advised to call back or seek an in-person evaluation if the symptoms worsen or if the condition fails to improve as anticipated. Fu 6-8 weeks,  Non face to face time spent: 18min   [name], MD 5/24/20213:17 PM

## 2019-07-20 ENCOUNTER — Telehealth: Payer: Self-pay | Admitting: Acute Care

## 2019-07-20 ENCOUNTER — Ambulatory Visit: Payer: Medicare Other | Admitting: Pharmacist

## 2019-07-20 ENCOUNTER — Other Ambulatory Visit: Payer: Self-pay

## 2019-07-20 DIAGNOSIS — E785 Hyperlipidemia, unspecified: Secondary | ICD-10-CM

## 2019-07-20 DIAGNOSIS — T380X5D Adverse effect of glucocorticoids and synthetic analogues, subsequent encounter: Secondary | ICD-10-CM

## 2019-07-20 DIAGNOSIS — J449 Chronic obstructive pulmonary disease, unspecified: Secondary | ICD-10-CM

## 2019-07-20 DIAGNOSIS — I10 Essential (primary) hypertension: Secondary | ICD-10-CM

## 2019-07-20 DIAGNOSIS — E099 Drug or chemical induced diabetes mellitus without complications: Secondary | ICD-10-CM

## 2019-07-20 NOTE — Telephone Encounter (Signed)
Request for medicare prescription drug coverage determination sent to Eric Form NP

## 2019-07-20 NOTE — Telephone Encounter (Signed)
Ignore previous entry.

## 2019-07-20 NOTE — Patient Instructions (Addendum)
Visit Information  For help with housing, request assistance from [employer] via their website: [email or web address]   This is a state-wide non-profit group that helps with various care needs, from housing to food assistance to income support. Once you request assistance, you will be contacted by a designated care coordinator.  Goals Addressed            This Visit's Progress   . Pharmacy Care Plan       CARE PLAN ENTRY  Current Barriers:  . Chronic Disease Management support, education, and care coordination needs related to Hypertension, Hyperlipidemia, Diabetes, and COPD   Hypertension . Pharmacist Clinical Goal(s): o Over the next 90 days, patient will work with PharmD and providers to maintain BP goal <130/80 . Current regimen:  o atenolol 25 mg daily, o HCTZ 25 mg daily . Interventions: o Discussed BP goal and benefits of medications . Patient self care activities - Over the next 90 days, patient will: o Check BP if dizzy, document, and provide at future appointments o Ensure daily salt intake < 2300 mg/day  Hyperlipidemia Lipid Panel     Component Value Date/Time   CHOL 125 12/17/2018 1402   TRIG 261.0 (H) 12/17/2018 1402   HDL 37.80 (L) 12/17/2018 1402   LDLDIRECT 56.0 12/17/2018 1402 .  Pharmacist Clinical Goal(s): o Over the next 90 days, patient will work with PharmD and providers to maintain LDL goal < 70 . Current regimen:  o Atorvastatin 20 mg daily . Interventions: o Discussed cholesterol goals and benefits of atorvastatin for prevention of heart attack and stroke . Patient self care activities - Over the next 90 days, patient will: o Continue medication as prescribed o Continue low cholesterol diet  Diabetes . Pharmacist Clinical Goal(s): o Over the next 90 days, patient will work with PharmD and providers to maintain A1c goal <6.5% . Current regimen:  o No medications . Interventions: o Discussed importance of diet and  exercise to prevent diabetes progression . Patient self care activities - Over the next 90 days, patient will: o Continue heart healthy diet (see attached)  COPD . Pharmacist Clinical Goal(s) o Over the next 90 days, patient will work with PharmD and providers to optimize therapy and reduce costs . Current regimen:  o Symbicort 160/4.5 - 2 puff BID,  o Combivent Respimat 1 puff q6h prn,  o Daliresp 500 mcg daily,  o montelukast 10 mg HS,  o albuterol nebulized PRN . Interventions: o Pursue patient assistance for Combivent via [employer] . Patient self care activities - Over the next 90 days, patient will: o Complete patient portion of [employer] application and mail to company  Medication management . Pharmacist Clinical Goal(s): o Over the next 90 days, patient will work with PharmD and providers to achieve optimal medication adherence . Current pharmacy: [facility] mail order . Interventions o Comprehensive medication review performed. o Continue current medication management strategy . Patient self care activities - Over the next 90 days, patient will: o Focus on medication adherence by fill date o Take medications as prescribed o Utilize $40/quarter OTC allowance from [facility] o Report any questions or concerns to PharmD and/or provider(s)  Initial goal documentation      The patient verbalized understanding of instructions provided today and agreed to receive a mailed copy of patient instruction and/or educational materials.  Telephone follow up appointment with pharmacy team member scheduled for: 3 months  Charlene Brooke, PharmD Clinical Pharmacist [facility]  [phone]  Heart-Healthy Eating Plan Heart-healthy meal planning includes:  Eating less unhealthy fats.  Eating more healthy fats.  Making other changes in your diet. Talk with your doctor or a diet specialist (dietitian) to create an eating plan that is right for you. What are tips  for following this plan? Cooking Avoid frying your food. Try to bake, boil, grill, or broil it instead. You can also reduce fat by:  Removing the skin from poultry.  Removing all visible fats from meats.  Steaming vegetables in water or broth. Meal planning   At meals, divide your plate into four equal parts: ? Fill one-half of your plate with vegetables and green salads. ? Fill one-fourth of your plate with whole grains. ? Fill one-fourth of your plate with lean protein foods.  Eat 4-5 servings of vegetables per day. A serving of vegetables is: ? 1 cup of raw or cooked vegetables. ? 2 cups of raw leafy greens.  Eat 4-5 servings of fruit per day. A serving of fruit is: ? 1 medium whole fruit. ?  cup of dried fruit. ?  cup of fresh, frozen, or canned fruit. ?  cup of 100% fruit juice.  Eat more foods that have soluble fiber. These are apples, broccoli, carrots, beans, peas, and barley. Try to get 20-30 g of fiber per day.  Eat 4-5 servings of nuts, legumes, and seeds per week: ? 1 serving of dried beans or legumes equals  cup after being cooked. ? 1 serving of nuts is  cup. ? 1 serving of seeds equals 1 tablespoon. General information  Eat more home-cooked food. Eat less restaurant, buffet, and fast food.  Limit or avoid alcohol.  Limit foods that are high in starch and sugar.  Avoid fried foods.  Lose weight if you are overweight.  Keep track of how much salt (sodium) you eat. This is important if you have high blood pressure. Ask your doctor to tell you more about this.  Try to add vegetarian meals each week. Fats  Choose healthy fats. These include olive oil and canola oil, flaxseeds, walnuts, almonds, and seeds.  Eat more omega-3 fats. These include salmon, mackerel, sardines, tuna, flaxseed oil, and ground flaxseeds. Try to eat fish at least 2 times each week.  Check food labels. Avoid foods with trans fats or high amounts of saturated fat.  Limit  saturated fats. ? These are often found in animal products, such as meats, butter, and cream. ? These are also found in plant foods, such as palm oil, palm kernel oil, and coconut oil.  Avoid foods with partially hydrogenated oils in them. These have trans fats. Examples are stick margarine, some tub margarines, cookies, crackers, and other baked goods. What foods can I eat? Fruits All fresh, canned (in natural juice), or frozen fruits. Vegetables Fresh or frozen vegetables (raw, steamed, roasted, or grilled). Green salads. Grains Most grains. Choose whole wheat and whole grains most of the time. Rice and pasta, including brown rice and pastas made with whole wheat. Meats and other proteins Lean, well-trimmed beef, veal, pork, and lamb. Chicken and [place] without skin. All fish and shellfish. Wild duck, rabbit, pheasant, and venison. Egg whites or low-cholesterol egg substitutes. Dried beans, peas, lentils, and tofu. Seeds and most nuts. Dairy Low-fat or nonfat cheeses, including ricotta and mozzarella. Skim or 1% milk that is liquid, powdered, or evaporated. Buttermilk that is made with low-fat milk. Nonfat or low-fat yogurt. Fats and oils Non-hydrogenated (trans-free) margarines. Vegetable oils,  including soybean, sesame, sunflower, olive, peanut, safflower, corn, canola, and cottonseed. Salad dressings or mayonnaise made with a vegetable oil. Beverages Mineral water. Coffee and tea. Diet carbonated beverages. Sweets and desserts Sherbet, gelatin, and fruit ice. Small amounts of dark chocolate. Limit all sweets and desserts. Seasonings and condiments All seasonings and condiments. The items listed above may not be a complete list of foods and drinks you can eat. Contact a dietitian for more options. What foods should I avoid? Fruits Canned fruit in heavy syrup. Fruit in cream or butter sauce. Fried fruit. Limit coconut. Vegetables Vegetables cooked in cheese, cream, or butter sauce.  Fried vegetables. Grains Breads that are made with saturated or trans fats, oils, or whole milk. Croissants. Sweet rolls. Donuts. High-fat crackers, such as cheese crackers. Meats and other proteins Fatty meats, such as hot dogs, ribs, sausage, bacon, rib-eye roast or steak. High-fat deli meats, such as salami and bologna. Caviar. Domestic duck and goose. Organ meats, such as liver. Dairy Cream, sour cream, cream cheese, and creamed cottage cheese. Whole-milk cheeses. Whole or 2% milk that is liquid, evaporated, or condensed. Whole buttermilk. Cream sauce or high-fat cheese sauce. Yogurt that is made from whole milk. Fats and oils Meat fat, or shortening. Cocoa butter, hydrogenated oils, palm oil, coconut oil, palm kernel oil. Solid fats and shortenings, including bacon fat, salt pork, lard, and butter. Nondairy cream substitutes. Salad dressings with cheese or sour cream. Beverages Regular sodas and juice drinks with added sugar. Sweets and desserts Frosting. Pudding. Cookies. Cakes. Pies. Milk chocolate or white chocolate. Buttered syrups. Full-fat ice cream or ice cream drinks. The items listed above may not be a complete list of foods and drinks to avoid. Contact a dietitian for more information. Summary  Heart-healthy meal planning includes eating less unhealthy fats, eating more healthy fats, and making other changes in your diet.  Eat a balanced diet. This includes fruits and vegetables, low-fat or nonfat dairy, lean protein, nuts and legumes, whole grains, and heart-healthy oils and fats. This information is not intended to replace advice given to you by your health care provider. Make sure you discuss any questions you have with your health care provider. Document Revised: 04/17/2017 Document Reviewed: 03/21/2017 Elsevier Patient Education  2020 [employer].

## 2019-07-20 NOTE — Telephone Encounter (Signed)
PA request was received from (pharmacy): [facility] Phone:[phone] Fax: [phone] Medication name and strength: Daliresp 500 mg Ordering Provider: Elie Confer NP  Was PA started with CMM?: yes If yes, please enter KEY: [id number] Medication tried and failed:  Covered Alternatives:   PA sent to plan, time frame for approval / denial: no response time indicated Routing to [place] for follow-up  PA case#  [id number]

## 2019-07-23 DIAGNOSIS — J9611 Chronic respiratory failure with hypoxia: Secondary | ICD-10-CM | POA: Diagnosis not present

## 2019-07-23 DIAGNOSIS — G4733 Obstructive sleep apnea (adult) (pediatric): Secondary | ICD-10-CM | POA: Diagnosis not present

## 2019-07-23 DIAGNOSIS — J441 Chronic obstructive pulmonary disease with (acute) exacerbation: Secondary | ICD-10-CM | POA: Diagnosis not present

## 2019-07-23 DIAGNOSIS — J449 Chronic obstructive pulmonary disease, unspecified: Secondary | ICD-10-CM | POA: Diagnosis not present

## 2019-07-27 ENCOUNTER — Ambulatory Visit (INDEPENDENT_AMBULATORY_CARE_PROVIDER_SITE_OTHER): Payer: Medicare Other | Admitting: Licensed Clinical Social Worker

## 2019-07-27 DIAGNOSIS — F411 Generalized anxiety disorder: Secondary | ICD-10-CM

## 2019-07-27 DIAGNOSIS — F4312 Post-traumatic stress disorder, chronic: Secondary | ICD-10-CM | POA: Diagnosis not present

## 2019-07-27 DIAGNOSIS — F0630 Mood disorder due to known physiological condition, unspecified: Secondary | ICD-10-CM | POA: Diagnosis not present

## 2019-07-27 DIAGNOSIS — R443 Hallucinations, unspecified: Secondary | ICD-10-CM | POA: Diagnosis not present

## 2019-07-27 NOTE — Progress Notes (Deleted)
New Patient Note  RE: Joan [name] MRN: [id number] DOB: 1960-11-12 Date of Office Visit: 07/28/2019  Referring provider: [name], * Primary care provider: [name], MD  Chief Complaint: No chief complaint on file.  History of Present Illness: I had the pleasure of seeing [name] for initial evaluation at the [facility] on 07/27/2019. She is a 59 y.o. female, who is referred here by [name], MD for the evaluation of allergic rhinitis - interested in injections. Up to date with COVID-19 vaccine: {Blank single:19197::"yes","no"}  She reports symptoms of ***. Symptoms have been going on for *** years. The symptoms are present *** all year around with worsening in ***. Other triggers include exposure to ***. Anosmia: ***. Headache: ***. She has used *** with ***fair improvement in symptoms. Sinus infections: ***. Previous work up includes: ***. Previous ENT evaluation: ***. Previous sinus imaging: ***. History of nasal polyps: ***. Last eye exam: ***. History of reflux: ***.  Assessment and Plan: Maleika is a 59 y.o. female with: No problem-specific Assessment & Plan notes found for this encounter.  No follow-ups on file.  No orders of the defined types were placed in this encounter.  Lab Orders  No laboratory test(s) ordered today    Other allergy screening: Asthma: {Blank single:19197::"yes","no"} Rhino conjunctivitis: {Blank single:19197::"yes","no"} Food allergy: {Blank single:19197::"yes","no"} Medication allergy: {Blank single:19197::"yes","no"} Hymenoptera allergy: {Blank single:19197::"yes","no"} Urticaria: {Blank single:19197::"yes","no"} Eczema:{Blank single:19197::"yes","no"} History of recurrent infections suggestive of immunodeficency: {Blank single:19197::"yes","no"}  Diagnostics: Spirometry:  Tracings reviewed. Her effort: {Blank single:19197::"Good reproducible efforts.","It was hard to get  consistent efforts and there is a question as to whether this reflects a maximal maneuver.","Poor effort, data can not be interpreted."} FVC: ***L FEV1: ***L, ***% predicted FEV1/FVC ratio: ***% Interpretation: {Blank single:19197::"Spirometry consistent with mild obstructive disease","Spirometry consistent with moderate obstructive disease","Spirometry consistent with severe obstructive disease","Spirometry consistent with possible restrictive disease","Spirometry consistent with mixed obstructive and restrictive disease","Spirometry uninterpretable due to technique","Spirometry consistent with normal pattern","No overt abnormalities noted given today's efforts"}.  Please see scanned spirometry results for details.  Skin Testing: {Blank single:19197::"Select foods","Environmental allergy panel","Environmental allergy panel and select foods","Food allergy panel","None","Deferred due to recent antihistamines use"}. Positive test to: ***. Negative test to: ***.  Results discussed with patient/family.   Past Medical History: Patient Active Problem List   Diagnosis Date Noted  . Head pain 06/16/2019  . Allergic rhinitis 06/16/2019  . Acute appendicitis 03/30/2019  . COPD (chronic obstructive pulmonary disease) ([place]) 03/30/2019  . Obese 01/27/2019  . S/P left TH revision 01/26/2019  . Cervicalgia 12/18/2018  . Chest pain of uncertain etiology [id number]  . Hypertension   . Medication management 11/06/2018  . Tobacco abuse 01/15/2018  . Hypomagnesemia 12/29/2017  . Insomnia 12/11/2017  . Pulmonary nodule 11/11/2017  . Carotid artery disease ([place]) 11/05/2017  . Hoarseness 09/09/2017  . OSA on CPAP 08/07/2017  . Family history of heart disease 07/18/2017  . GERD without esophagitis 06/23/2017  . H/O total hip arthroplasty, left 06/18/2017  . Hypothyroidism due to acquired atrophy of thyroid 04/05/2017  . Primary osteoarthritis involving multiple joints 04/05/2017  . Incarcerated ventral  hernia 04/04/2017  . Chronic constipation 02/10/2017  . Iron deficiency anemia due to dietary causes 02/10/2017  . Osteoarthritis 02/10/2017  . Dyslipidemia 02/10/2017  . H/O total hip arthroplasty, right 02/05/2017  . Rash 01/08/2017  . Pre-operative clearance 12/16/2016  . Chronic respiratory failure with hypoxia ([place]) 12/16/2016  . Essential hypertension 11/29/2016  . Back  pain 11/29/2016  . Vocal cord dysfunction   . COPD, severe ([place]) 09/24/2016  . Anxiety 09/24/2016  . Steroid-induced diabetes ([place]) 09/24/2016   Past Medical History:  Diagnosis Date  . Acute respiratory failure with hypoxia ([place])   . Alcoholism and drug addiction in family   . Anxiety   . Appendicitis   . Arthritis    "knees, hips, lower back" (09/25/2016)  . Asthma   . Bipolar disorder ([place])   . Chronic bronchitis ([place])    "all the time" (09/25/2016)  . Chronic leg pain    RLE  . Chronic lower back pain   . COPD (chronic obstructive pulmonary disease) ([place])   . Depression   . Diabetes mellitus without complication ([place])   . Diabetic neuropathy ([place])   . Early cataracts, bilateral   . Emphysema of lung ([place])   . GERD (gastroesophageal reflux disease)   . Headache   . Hepatitis C    "treated back in 2001-2002"  . High cholesterol   . Hypertension   . Hypothyroidism   . Incarcerated ventral hernia 04/04/2017  . On home oxygen therapy    "2L; at nighttime" (04/04/2017)  . OSA on CPAP    CPAP with Oxygen 2 liters  . Paranoid ([place])   . Pneumonia    "all the time" (09/25/2016)  . Positive PPD    with negative chest x ray  . Tachycardia    patient reports with exertion ; denies any other conditions   . Thyroid disease   . Wears glasses   . Wears partial dentures    Past Surgical History: Past Surgical History:  Procedure Laterality Date  . BACK SURGERY    . BIOPSY  11/24/2018   Procedure: BIOPSY;  Surgeon: Thornton Park, MD;  Location: [facility];  Service: Gastroenterology;;  . CARDIAC  CATHETERIZATION    . COLONOSCOPY    . COLONOSCOPY WITH PROPOFOL N/A 11/24/2018   Procedure: COLONOSCOPY WITH PROPOFOL;  Surgeon: Thornton Park, MD;  Location: [facility];  Service: Gastroenterology;  Laterality: N/A;  . CYST EXCISION     removal of cyst in sinuses  . DILATION AND CURETTAGE OF UTERUS    . ESOPHAGOGASTRODUODENOSCOPY (EGD) WITH PROPOFOL N/A 11/24/2018   Procedure: ESOPHAGOGASTRODUODENOSCOPY (EGD) WITH PROPOFOL;  Surgeon: Thornton Park, MD;  Location: [facility];  Service: Gastroenterology;  Laterality: N/A;  . FOOT SURGERY Bilateral    bone removed from 4th and 5 th toes and put in pin then took pin out  . HERNIA REPAIR    . INCISION AND DRAINAGE  ~ 2014/2015   "removed mesh & infection"  . INCISIONAL HERNIA REPAIR N/A 04/04/2017   Procedure: LAPAROSCOPIC INCISIONAL HERNIA REPAIR WITH MESH;  Surgeon: Fanny Skates, MD;  Location: [facility];  Service: General;  Laterality: N/A;  . LACERATION REPAIR Right    repair wrist artery from laceration from ice skate  . LAPAROSCOPIC APPENDECTOMY N/A 03/31/2019   Procedure: APPENDECTOMY LAPAROSCOPIC;  Surgeon: Ralene Ok, MD;  Location: [facility];  Service: General;  Laterality: N/A;  . LAPAROSCOPIC APPENDECTOMY N/A 07/09/2019   Procedure: APPENDECTOMY LAPAROSCOPIC;  Surgeon: Ralene Ok, MD;  Location: [facility];  Service: General;  Laterality: N/A;  . LAPAROSCOPIC INCISIONAL / UMBILICAL / [place]  04/04/2017   LAPAROSCOPIC INCISIONAL HERNIA REPAIR WITH MESH  . LIPOMA EXCISION Right    fattty lipoma in neck  . NASAL SEPTUM SURGERY    . POSTERIOR LUMBAR FUSION  2015/2016 X 2   "added rods and  screws"  . TOTAL HIP ARTHROPLASTY Right 02/05/2017   Procedure: RIGHT TOTAL HIP ARTHROPLASTY;  Surgeon: [name], MD;  Location: [facility];  Service: Orthopedics;  Laterality: Right;  . TOTAL HIP ARTHROPLASTY Left 06/18/2017   Procedure: LEFT TOTAL HIP ARTHROPLASTY;  Surgeon: [name], MD;  Location[facility];   Service: Orthopedics;  Laterality: Left;  . TOTAL HIP REVISION Left 01/26/2019   Procedure: TOTAL HIP REVISION;  Surgeon: Paralee Cancel, MD;  Location: [facility];  Service: Orthopedics;  Laterality: Left;  2 hrs  . TUBAL LIGATION    . UMBILICAL HERNIA REPAIR  ~ 2013   w/mesh  . UPPER GI ENDOSCOPY     Medication List:  Current Outpatient Medications  Medication Sig Dispense Refill  . acetaminophen (TYLENOL) 500 MG tablet Take 2 tablets (1,000 mg total) by mouth every 8 (eight) hours as needed for mild pain. 30 tablet 0  . acyclovir (ZOVIRAX) 400 MG tablet Take 1 tablet (400 mg total) by mouth 2 (two) times daily. (Patient taking differently: Take 400 mg by mouth daily. ) 180 tablet 3  . albuterol (PROVENTIL) (2.5 MG/3ML) 0.083% nebulizer solution Take 3 mLs (2.5 mg total) by nebulization 2 (two) times daily. And every 6 hours as needed.  DX: J44.9 (Patient taking differently: Take 2.5 mg by nebulization in the morning and at bedtime. ) 360 mL 3  . atenolol (TENORMIN) 25 MG tablet TAKE 1 TABLET BY MOUTH  DAILY (Patient taking differently: Take 25 mg by mouth daily. ) 90 tablet 1  . atorvastatin (LIPITOR) 20 MG tablet TAKE 1 TABLET BY MOUTH  DAILY (Patient taking differently: Take 20 mg by mouth daily. ) 90 tablet 1  . budesonide-formoterol (SYMBICORT) 160-4.5 MCG/ACT inhaler Inhale 2 puffs into the lungs 2 (two) times daily. 2 Inhaler 0  . buPROPion (WELLBUTRIN XL) 300 MG 24 hr tablet Take 300 mg by mouth daily.    . cyclobenzaprine (FLEXERIL) 10 MG tablet TAKE 1 TABLET BY MOUTH AT  BEDTIME (Patient taking differently: Take 10 mg by mouth at bedtime. ) 90 tablet 0  . diphenhydrAMINE (BENADRYL) 25 MG tablet Take 25 mg by mouth 2 (two) times daily.     Marland Kitchen docusate sodium (COLACE) 100 MG capsule Take 1 capsule (100 mg total) by mouth 2 (two) times daily. (Patient taking differently: Take 100 mg by mouth at bedtime. ) 28 capsule 0  . hydrochlorothiazide (HYDRODIURIL) 25 MG tablet TAKE 1 TABLET BY MOUTH   DAILY (Patient taking differently: Take 25 mg by mouth daily. ) 90 tablet 1  . Ipratropium-Albuterol (COMBIVENT RESPIMAT) 20-100 MCG/ACT AERS respimat Inhale 1 puff into the lungs every 6 (six) hours as needed for wheezing. (Patient taking differently: Inhale 1 puff into the lungs in the morning and at bedtime. Take additional puff if needed at bedtime) 3 Inhaler 3  . levothyroxine (SYNTHROID) 25 MCG tablet TAKE 1 TABLET BY MOUTH  DAILY BEFORE BREAKFAST (Patient taking differently: Take 25 mcg by mouth daily. ) 90 tablet 1  . Liniments (SALONPAS ARTHRITIS PAIN RELIEF EX) Apply 1 patch topically daily as needed (to painful sites- remove as directed).     . Melatonin 10 MG TABS Take 10 mg by mouth at bedtime.    . montelukast (SINGULAIR) 10 MG tablet TAKE 1 TABLET BY MOUTH AT  BEDTIME (Patient taking differently: Take 10 mg by mouth at bedtime. ) 90 tablet 3  . Multiple Vitamins-Minerals (ADULT ONE DAILY GUMMIES) CHEW Chew 1 tablet by mouth daily.     Marland Kitchen  naproxen (NAPROSYN) 500 MG tablet Take 500 mg by mouth daily as needed for mild pain.     Marland Kitchen nystatin-triamcinolone ointment (MYCOLOG) Apply 1 application topically 2 (two) times daily as needed (applied to skin folds/groins/under breast skin irritation rash.). (Patient taking differently: Apply 1 application topically 2 (two) times daily as needed (to skin folds/groin/under breast skin- for irritation/rashes). ) 100 g 3  . OXYGEN Inhale 2-3 L/min into the lungs See admin instructions. Inhale  2-3 L/min of oxygen into the lungs w/CPAP at bedtime and as needed for shortness of breath with "strenuous activity" during the day    . pantoprazole (PROTONIX) 40 MG tablet TAKE 1 TABLET BY MOUTH  DAILY (Patient taking differently: Take 40 mg by mouth daily. ) 90 tablet 1  . polyethylene glycol (MIRALAX / GLYCOLAX) 17 g packet Take 17 g by mouth daily as needed for mild constipation. 28 packet 0  . pregabalin (LYRICA) 75 MG capsule Take 1 capsule (75 mg total) by  mouth 2 (two) times daily. (Patient taking differently: Take 150 mg by mouth at bedtime. ) 180 capsule 1  . QUEtiapine (SEROQUEL) 50 MG tablet Take 1 tablet (50 mg total) by mouth at bedtime. Dose changed to 50mg  from 25mg  30 tablet 0  . roflumilast (DALIRESP) 500 MCG TABS tablet Take 1 tablet (500 mcg total) by mouth daily. 90 tablet 3  . traMADol (ULTRAM) 50 MG tablet Take 1 tablet (50 mg total) by mouth every 6 (six) hours as needed. 20 tablet 0  . traZODone (DESYREL) 100 MG tablet TAKE 1 TABLET BY MOUTH AT  BEDTIME (Patient taking differently: Take 100 mg by mouth at bedtime. ) 90 tablet 3  . venlafaxine XR (EFFEXOR-XR) 150 MG 24 hr capsule TAKE 1 CAPSULE BY MOUTH  DAILY WITH BREAKFAST (Patient taking differently: Take 150 mg by mouth daily. ) 90 capsule 3   No current facility-administered medications for this visit.   Allergies: Allergies  Allergen Reactions  . Keflex [Cephalexin] Rash and Other (See Comments)    Has tolerated amoxicillin since had keflex  . Prednisone Other (See Comments)    "counter reacts"  . Shellfish Allergy Shortness Of Breath, Nausea And Vomiting and Other (See Comments)    Stomach cramps  . Tetracyclines & Related Anaphylaxis, Swelling and Other (See Comments)    Throat swelling requiring hospitalization  . Dilaudid [Hydromorphone Hcl] Other (See Comments)    "Lethargy"  . Incruse Ellipta [Umeclidinium Bromide] Nausea Only  . Tuberculin Tests Other (See Comments)    False postive   Social History: Social History   Socioeconomic History  . Marital status: Widowed    Spouse name: Not on file  . Number of children: 3  . Years of education: Not on file  . Highest education level: Not on file  Occupational History  . Not on file  Tobacco Use  . Smoking status: Former Smoker    Packs/day: 1.00    Years: 44.00    Pack years: 44.00    Types: Cigarettes    Quit date: 11/30/2018    Years since quitting: 0.6  . Smokeless tobacco: Never Used  . Tobacco  comment: Smoker 1-1.5 PPD every day, 11-20-2018 "i got myself down to half a pack", i am gonna be going on chantix on the 28th"  Substance and Sexual Activity  . Alcohol use: Not Currently  . Drug use: Not Currently    Types: "Crack" cocaine    Comment:  "nothing since 01/12/2012"  . Sexual  activity: Not Currently    Partners: Male  Other Topics Concern  . Not on file  Social History Narrative  . Not on file   Social Determinants of Health   Financial Resource Strain: Low Risk   . Difficulty of Paying Living Expenses: Not very hard  Food Insecurity:   . Worried About Charity fundraiser in the Last Year:   . Arboriculturist in the Last Year:   Transportation Needs:   . Film/video editor (Medical):   Marland Kitchen Lack of Transportation (Non-Medical):   Physical Activity:   . Days of Exercise per Week:   . Minutes of Exercise per Session:   Stress:   . Feeling of Stress :   Social Connections:   . Frequency of Communication with Friends and Family:   . Frequency of Social Gatherings with Friends and Family:   . Attends Religious Services:   . Active Member of Clubs or Organizations:   . Attends Archivist Meetings:   Marland Kitchen Marital Status:    Lives in a ***. Smoking: *** Occupation: ***  Environmental HistoryFreight forwarder in the house: Estate agent in the family room: {Blank single:19197::"yes","no"} Carpet in the bedroom: {Blank single:19197::"yes","no"} Heating: {Blank single:19197::"electric","gas"} Cooling: {Blank single:19197::"central","window"} Pet: {Blank single:19197::"yes ***","no"}  Family History: Family History  Problem Relation Age of Onset  . Diabetes Mother   . Hypertension Mother   . Hypertension Father   . Cancer Father        lung   Problem                               Relation Asthma                                   *** Eczema                                *** Food allergy                           *** Allergic rhino conjunctivitis     ***  Review of Systems  Constitutional: Negative for appetite change, chills, fever and unexpected weight change.  HENT: Negative for congestion and rhinorrhea.   Eyes: Negative for itching.  Respiratory: Negative for cough, chest tightness, shortness of breath and wheezing.   Cardiovascular: Negative for chest pain.  Gastrointestinal: Negative for abdominal pain.  Genitourinary: Negative for difficulty urinating.  Skin: Negative for rash.  Neurological: Negative for headaches.   Objective: There were no vitals taken for this visit. There is no height or weight on file to calculate BMI. Physical Exam  Constitutional: She is oriented to person, place, and time. She appears well-developed and well-nourished.  HENT:  Head: Normocephalic and atraumatic.  Right Ear: External ear normal.  Left Ear: External ear normal.  Nose: Nose normal.  Mouth/Throat: Oropharynx is clear and moist.  Eyes: Conjunctivae and EOM are normal.  Cardiovascular: Normal rate, regular rhythm and normal heart sounds. Exam reveals no gallop and no friction rub.  No murmur heard. Pulmonary/Chest: Effort normal and breath sounds normal. She has no wheezes. She has no rales.  Abdominal: Soft.  Musculoskeletal:     Cervical back: Neck supple.  Neurological: She is  alert and oriented to person, place, and time.  Skin: Skin is warm. No rash noted.  Psychiatric: She has a normal mood and affect. Her behavior is normal.  Nursing note and vitals reviewed.  The plan was reviewed with the patient/family, and all questions/concerned were addressed.  It was my pleasure to see Deloyce today and participate in her care. Please feel free to contact me with any questions or concerns.  Sincerely,  Rexene Alberts, DO Allergy & Immunology  [facility] office: [phone] [facility] office: [place] office: [phone]

## 2019-07-27 NOTE — Progress Notes (Addendum)
Virtual Visit via Video Note  Therapist-home office, patient-home   I connected with Joan [name] on 07/27/19 at 10:00 AM EDT by a video enabled telemedicine application and verified that I am speaking with the correct person using two identifiers.   I discussed the limitations of evaluation and management by telemedicine and the availability of in person appointments. The patient expressed understanding and agreed to proceed.  I discussed the assessment and treatment plan with the patient. The patient was provided an opportunity to ask questions and all were answered. The patient agreed with the plan and demonstrated an understanding of the instructions.   The patient was advised to call back or seek an in-person evaluation if the symptoms worsen or if the condition fails to improve as anticipated.  I provided 53 minutes of non-face-to-face time during this encounter.   THERAPIST PROGRESS NOTE  Session Time: 10:00 AM to 10:53 AM  Participation Level: Active  Behavioral Response: CasualAlertEuthymic  Type of Therapy: Individual Therapy  Treatment Goals addressed:  PTSD, coping, hallucinations  Interventions: Solution Focused, Strength-based, Supportive and Other: coping  Summary: Joan [name] is a 59 y.o. female who presents with sharing for Memorial Day she had a cook out with brother, two sisters, nephew and nephew's baby.  Therapist feedback was that was positive that she engaged in activity with brother she enjoyed especially as there has been difficulties in the relationship.  Patient shares they had a nice time. Discussed relationship with brother something needs to talk about and why therapy helpful. It helps in part with her identifying what she needs to do to clean up her side. C-PAP supposed to make her feel better in the morning and wake her up more. Not sure what was going on. Felt like Oxygen was making her groggy so not using. Wakes up and takes medication  and mask for nebulizer. Still feels tired. Doesn't tan everyday. Used to be addicted to it. Likes to go out in sun and play her gospel music and closes her eyes. Only thinks about her conversation with [name]. Patient is very spiritual. Interested in a service like nondenominational. When in program went to church and she loved it. There was worship, preaching and fellowship. Discussed physically should exercise. Housework gets so sore, used muscles that don't use. Discussed what it takes determination, has to self-talk. Has to do it awhile before she cleans. Has been thinking about joining a gym. Can join for free. Wants to get a hold of lung doctor and get her signed up for people who smoke. Go into a exercise group. 2 times a week. Work on breathing techniques, build up the lungs.           Suicidal/Homicidal: No  Therapist Response: Therapist provided patient with ongoing emotional support and encouragement, reviewed symptoms, facilitated expression of thoughts and feelings.  Reviewed coping strategies to help patient continue to make progress with both mental health and physical symptoms.  Reviewed patient engaging in exercise classes, particularly helpful for classes of ex-smoker's.  Utilize motivational interviewing strategies to build motivation to engage in activities that will be healthy for her and help her to feel good about herself.  Noted use of therapy to help her work on stressors such as relationship with brother and provided positive feedback for patient's use to work on her part to help improve relationship.  Identified patient's strength in her spirituality that helps with coping and interest in different aspects that will help her with spirituality including  looking for a church.continue with supportive interventions as patient deals with recovering from surgery, managing feelings of being tired, dealing with lung issues.  Reinforced the importance of client staying focused on her own strengths  and resources and resiliency. Processed various strategies for dealing with stressors.   Plan: Return again in 2 weeks.2.  Patient take steps to start exercising, processed feelings to help with coping and utilize helpful coping strategies for mental health and physical symptoms  Diagnosis: Axis I:  PTSD, hallucinations, GAD, Mood Disorder in conditions classified elsewhere    Axis II: No diagnosis    [name], LCSW 07/27/2019

## 2019-07-28 ENCOUNTER — Ambulatory Visit: Payer: Medicare Other | Admitting: Allergy

## 2019-07-28 ENCOUNTER — Ambulatory Visit (INDEPENDENT_AMBULATORY_CARE_PROVIDER_SITE_OTHER): Payer: Medicare Other | Admitting: Primary Care

## 2019-07-28 ENCOUNTER — Other Ambulatory Visit: Payer: Self-pay

## 2019-07-28 DIAGNOSIS — J441 Chronic obstructive pulmonary disease with (acute) exacerbation: Secondary | ICD-10-CM | POA: Diagnosis not present

## 2019-07-28 MED ORDER — METHYLPREDNISOLONE 4 MG PO TBPK: ORAL_TABLET | ORAL | 0 refills | Status: DC

## 2019-07-28 MED ORDER — AZITHROMYCIN 250 MG PO TABS: ORAL_TABLET | ORAL | 0 refills | Status: DC

## 2019-07-28 NOTE — Progress Notes (Signed)
Virtual Visit via Telephone Note  I connected with Joan [name] on 07/28/19 at  3:00 PM EDT by telephone and verified that I am speaking with the correct person using two identifiers.  Location: Patient: Home Provider: Office   I discussed the limitations, risks, security and privacy concerns of performing an evaluation and management service by telephone and the availability of in person appointments. I also discussed with the patient that there may be a patient responsible charge related to this service. The patient expressed understanding and agreed to proceed.   History of Present Illness: 59 year old female, former smoker. PMH significant for COPD and OSA. Patient of Dr. Elsworth Soho, last seen on 06/15/19.   07/28/2019 Patient contacted for acute televisit with reports of shortness of breath. She gets very upset because she doesn't understand why she is still having issues with her breathing, she quit smoking back in October 2020. Her breathing was better for a time but lately she has been having to use her Albuterol nebulizer treatment more often. She had very little improvement with breathing treatment yesterday. She has a chronic cough,  occasionally gets up brown mucus. Uses Symbicort twice a day with aerochamber. Has combivent rescue inhaler which she uses twice a day in the morning and evening. She is also on Daliresp 58mcg and Singulair 10mg  daily. She can not afford Spiriva.   States that she is easily confused, this has been going on for the last several months. Also reports neck pain. CT head for neck pain was unremarkable. CT neck showed arthritis.    Observations/Objective:  - Able to speak in full sentences, no significant shortness of breath or wheezing  Assessment and Plan:  Severe COPD: - Increased shortness of breath and cough x 2 weeks - Acute exacerbation vs increased symptom burden, quit smoking 8 months ago - Continue Symbicort two puffs twice daily; Combivent 4  times a day   - Continue Daliresp 55mcg daily and Singulair 10mg  at bedtime  - RX zpack and methyprednisolone pack - Refer to pulmonary rehab  Follow Up Instructions:  - Return if symptoms do not improve or worse    I discussed the assessment and treatment plan with the patient. The patient was provided an opportunity to ask questions and all were answered. The patient agreed with the plan and demonstrated an understanding of the instructions.   The patient was advised to call back or seek an in-person evaluation if the symptoms worsen or if the condition fails to improve as anticipated.  I provided 18 minutes of non-face-to-face time during this encounter.   Martyn Ehrich, NP

## 2019-07-28 NOTE — Patient Instructions (Addendum)
Recommendations: Continue Symbicort two puffs twice daily Continue Combivent 4 times a day  Rx: Azithromycin and methylprednisolone pack   Orders: Pulmonary rehab      Roflumilast oral tablets What is this medicine? ROFLUMILAST (roe FLUE mi last) decreases inflammation in the lungs. This medicine is used to prevent COPD flare-ups. Do not use this medicine to treat sudden breathing problems. This medicine may be used for other purposes; ask your health care provider or pharmacist if you have questions. COMMON BRAND NAME(S): Daliresp What should I tell my health care provider before I take this medicine? They need to know if you have any of these conditions:  liver disease  mental illness  an unusual or allergic reaction to roflumilast, other medicines, foods, dyes, or preservatives  pregnant or trying to get pregnant  breast-feeding How should I use this medicine? Take this medicine by mouth with a glass of water. Follow the directions on the prescription label. You can take it with or without food. If it upsets your stomach, take it with food. Take your medicine at regular intervals. Do not take it more often than directed. Do not stop taking except on your doctor's advice. A special MedGuide will be given to you by the pharmacist with each prescription and refill. Be sure to read this information carefully each time. Talk to your pediatrician regarding the use of this medicine in children. Special care may be needed. Overdosage: If you think you have taken too much of this medicine contact a poison control center or emergency room at once. NOTE: This medicine is only for you. Do not share this medicine with others. What if I miss a dose? If you miss a dose, take it as soon as you can. If it is almost time for your next dose, take only that dose. Do not take double or extra doses. What may interact with this  medicine?  carbamazepine  cimetidine  enoxacin  erythromycin  fluvoxamine  ketoconazole  phenobarbital  phenytoin  rifampicin  St. [name]'s wort This list may not describe all possible interactions. Give your health care provider a list of all the medicines, herbs, non-prescription drugs, or dietary supplements you use. Also tell them if you smoke, drink alcohol, or use illegal drugs. Some items may interact with your medicine. What should I watch for while using this medicine? Visit your doctor for regular check ups. Tell your doctor or healthcare professional if your symptoms do not start to get better or if they get worse. What side effects may I notice from receiving this medicine? Side effects that you should report to your doctor or health care professional as soon as possible:  allergic reactions like skin rash, itching or hives, swelling of the face, lips, or tongue  anxious  breathing problems  suicidal thoughts or other mood changes  trouble sleeping  weight loss Side effects that usually do not require medical attention (report to your doctor or health care professional if they continue or are bothersome):  back pain  diarrhea  dizziness  general ill feeling or flu-like symptoms  headache  loss of appetite  nausea, vomiting This list may not describe all possible side effects. Call your doctor for medical advice about side effects. You may report side effects to FDA at 1-800-FDA-1088. Where should I keep my medicine? Keep out of the reach of children. Store at room temperature between 15 and 30 degrees C (59 and 86 degrees F). Throw away any unused medicine after the expiration  date. NOTE: This sheet is a summary. It may not cover all possible information. If you have questions about this medicine, talk to your doctor, pharmacist, or health care provider.  2020 Elsevier/Gold Standard (2015-03-16 09:35:23)

## 2019-07-29 ENCOUNTER — Ambulatory Visit: Payer: Self-pay | Admitting: Pharmacist

## 2019-07-29 DIAGNOSIS — J449 Chronic obstructive pulmonary disease, unspecified: Secondary | ICD-10-CM

## 2019-07-29 NOTE — Chronic Care Management (AMB) (Signed)
Received call from patient asking about Daliresp and albuterol nebulizer solution. She reports she received a 90 day supply of Daliresp for >$200 from her mail order pharmacy, but she is confused because she never requested it to be filled and she already gets Dalirsep through [employer] patient assistance for free. She has already reversed the payment with her mail order pharmacy and is sending it back. Also provided patient with phone number for [employer] at her request.  Pt also reports she needs a refill for albuterol nebulizer solution - she thinks she used to get this through a patient assistance program but she has not had it filled since 2020. Her insurance covers albuterol nebulizer for free through mail order. Pulmonology handles inhalers/nebulizers for the patient.  Plan Request refill for albuterol nebulizer - $0 for 90 day supply through mail order

## 2019-08-02 ENCOUNTER — Telehealth: Payer: Self-pay | Admitting: Primary Care

## 2019-08-02 DIAGNOSIS — J449 Chronic obstructive pulmonary disease, unspecified: Secondary | ICD-10-CM

## 2019-08-02 NOTE — Telephone Encounter (Signed)
-   Refer to pulmonary rehab re: severe COPD

## 2019-08-05 ENCOUNTER — Telehealth: Payer: Self-pay | Admitting: Acute Care

## 2019-08-05 DIAGNOSIS — J449 Chronic obstructive pulmonary disease, unspecified: Secondary | ICD-10-CM

## 2019-08-05 MED ORDER — ALBUTEROL SULFATE (2.5 MG/3ML) 0.083% IN NEBU: 2.5000 mg | INHALATION_SOLUTION | Freq: Two times a day (BID) | RESPIRATORY_TRACT | 3 refills | Status: DC

## 2019-08-05 NOTE — Telephone Encounter (Signed)
Triage, Please see below, and please place requested order for  Nebs. Thanks so much  I am a clinical pharmacist with [facility] @ [facility] with Dr Pricilla Holm; I spoke with Angelina Ok today and she will run out of albuterol nebulizer solution soon - she told me she used to get this through a patient assistance program, but I checked her formulary and her insurance will cover this 100% through mail order. Since your office handles her inhalers, would you send a new rx to her mail order pharmacy [facility]?

## 2019-08-05 NOTE — Telephone Encounter (Signed)
Rx has been sent in per [name].

## 2019-08-09 ENCOUNTER — Encounter (HOSPITAL_COMMUNITY): Payer: Self-pay | Admitting: Psychiatry

## 2019-08-09 ENCOUNTER — Telehealth (INDEPENDENT_AMBULATORY_CARE_PROVIDER_SITE_OTHER): Payer: Medicare Other | Admitting: Psychiatry

## 2019-08-09 DIAGNOSIS — J449 Chronic obstructive pulmonary disease, unspecified: Secondary | ICD-10-CM | POA: Diagnosis not present

## 2019-08-09 DIAGNOSIS — F0630 Mood disorder due to known physiological condition, unspecified: Secondary | ICD-10-CM | POA: Diagnosis not present

## 2019-08-09 DIAGNOSIS — F4312 Post-traumatic stress disorder, chronic: Secondary | ICD-10-CM | POA: Diagnosis not present

## 2019-08-09 DIAGNOSIS — F411 Generalized anxiety disorder: Secondary | ICD-10-CM | POA: Diagnosis not present

## 2019-08-09 DIAGNOSIS — J441 Chronic obstructive pulmonary disease with (acute) exacerbation: Secondary | ICD-10-CM | POA: Diagnosis not present

## 2019-08-09 DIAGNOSIS — J9611 Chronic respiratory failure with hypoxia: Secondary | ICD-10-CM | POA: Diagnosis not present

## 2019-08-09 MED ORDER — QUETIAPINE FUMARATE 50 MG PO TABS: 50.0000 mg | ORAL_TABLET | Freq: Every day | ORAL | 1 refills | Status: DC

## 2019-08-09 NOTE — Progress Notes (Signed)
[facility] Follow up visit  Patient Identification: Joan [name] MRN:  [id number] Date of Evaluation:  08/09/2019 Referral Source:Therapist [name] Chief Complaint:    depression follow up  Visit Diagnosis:    ICD-10-CM   1. Mood disorder in conditions classified elsewhere  F06.30   2. GAD (generalized anxiety disorder)  F41.1   3. Post-traumatic stress disorder, chronic  F43.12       I connected with Joan [name] on 08/09/19 at  2:00 PM EDT by a video enabled telemedicine application and verified that I am speaking with the correct person using two identifiers.   Patient location : home Provider location : home office   I discussed the limitations of evaluation and management by telemedicine and the availability of in person appointments. The patient expressed understanding and agreed to proceed.  History of Present Illness: Patient is a 59 years old currently widow Caucasian female referred by her therapist for management of mood symptoms, depression possible PTSD and anxiety  Doing better, not seeing shadows, seroquel was increased last visit to 50mg  No tremors Gets other meds from primary care Has sufferred from grief and trauma and working in therapy with Joan [name]   Aggravating factor; difficult childhood, Grief, difficult relationship with middle kid, hip replacement Modifying factor: siblings, but still has difficult time with them   Duration adult life Severity of depression: denies hopelessness Some hallucinations after husband death but not now      Past Psychiatric History: depression, drug use  Previous Psychotropic Medications: Yes   Substance Abuse History in the last 12 months:  No.  Consequences of Substance Abuse: history of extensive drug use uptil 8 years ago and have been in rehab programs  Past Medical History:  Past Medical History:  Diagnosis Date  . Acute respiratory failure with hypoxia ([place])   . Alcoholism and drug addiction in  family   . Anxiety   . Appendicitis   . Arthritis    "knees, hips, lower back" (09/25/2016)  . Asthma   . Bipolar disorder ([place])   . Chronic bronchitis ([place])    "all the time" (09/25/2016)  . Chronic leg pain    RLE  . Chronic lower back pain   . COPD (chronic obstructive pulmonary disease) ([place])   . Depression   . Diabetes mellitus without complication ([place])   . Diabetic neuropathy ([place])   . Early cataracts, bilateral   . Emphysema of lung ([place])   . GERD (gastroesophageal reflux disease)   . Headache   . Hepatitis C    "treated back in 2001-2002"  . High cholesterol   . Hypertension   . Hypothyroidism   . Incarcerated ventral hernia 04/04/2017  . On home oxygen therapy    "2L; at nighttime" (04/04/2017)  . OSA on CPAP    CPAP with Oxygen 2 liters  . Paranoid ([place])   . Pneumonia    "all the time" (09/25/2016)  . Positive PPD    with negative chest x ray  . Tachycardia    patient reports with exertion ; denies any other conditions   . Thyroid disease   . Wears glasses   . Wears partial dentures     Past Surgical History:  Procedure Laterality Date  . BACK SURGERY    . BIOPSY  11/24/2018   Procedure: BIOPSY;  Surgeon: Thornton Park, MD;  Location: [facility];  Service: Gastroenterology;;  . CARDIAC CATHETERIZATION    . COLONOSCOPY    . COLONOSCOPY WITH PROPOFOL  N/A 11/24/2018   Procedure: COLONOSCOPY WITH PROPOFOL;  Surgeon: Thornton Park, MD;  Location: [facility];  Service: Gastroenterology;  Laterality: N/A;  . CYST EXCISION     removal of cyst in sinuses  . DILATION AND CURETTAGE OF UTERUS    . ESOPHAGOGASTRODUODENOSCOPY (EGD) WITH PROPOFOL N/A 11/24/2018   Procedure: ESOPHAGOGASTRODUODENOSCOPY (EGD) WITH PROPOFOL;  Surgeon: Thornton Park, MD;  Location: [facility];  Service: Gastroenterology;  Laterality: N/A;  . FOOT SURGERY Bilateral    bone removed from 4th and 5 th toes and put in pin then took pin out  . HERNIA REPAIR    . INCISION AND DRAINAGE   ~ 2014/2015   "removed mesh & infection"  . INCISIONAL HERNIA REPAIR N/A 04/04/2017   Procedure: LAPAROSCOPIC INCISIONAL HERNIA REPAIR WITH MESH;  Surgeon: Fanny Skates, MD;  Location: [facility];  Service: General;  Laterality: N/A;  . LACERATION REPAIR Right    repair wrist artery from laceration from ice skate  . LAPAROSCOPIC APPENDECTOMY N/A 03/31/2019   Procedure: APPENDECTOMY LAPAROSCOPIC;  Surgeon: Ralene Ok, MD;  Location: [facility];  Service: General;  Laterality: N/A;  . LAPAROSCOPIC APPENDECTOMY N/A 07/09/2019   Procedure: APPENDECTOMY LAPAROSCOPIC;  Surgeon: Ralene Ok, MD;  Location: [facility];  Service: General;  Laterality: N/A;  . LAPAROSCOPIC INCISIONAL / UMBILICAL / [place]  04/04/2017   LAPAROSCOPIC INCISIONAL HERNIA REPAIR WITH MESH  . LIPOMA EXCISION Right    fattty lipoma in neck  . NASAL SEPTUM SURGERY    . POSTERIOR LUMBAR FUSION  2015/2016 X 2   "added rods and screws"  . TOTAL HIP ARTHROPLASTY Right 02/05/2017   Procedure: RIGHT TOTAL HIP ARTHROPLASTY;  Surgeon: [name], MD;  Location: [facility];  Service: Orthopedics;  Laterality: Right;  . TOTAL HIP ARTHROPLASTY Left 06/18/2017   Procedure: LEFT TOTAL HIP ARTHROPLASTY;  Surgeon: [name], MD;  Location: [facility];  Service: Orthopedics;  Laterality: Left;  . TOTAL HIP REVISION Left 01/26/2019   Procedure: TOTAL HIP REVISION;  Surgeon: Paralee Cancel, MD;  Location: [facility];  Service: Orthopedics;  Laterality: Left;  2 hrs  . TUBAL LIGATION    . UMBILICAL HERNIA REPAIR  ~ 2013   w/mesh  . UPPER GI ENDOSCOPY      Family Psychiatric History: FAther : alcohol use  Family History:  Family History  Problem Relation Age of Onset  . Diabetes Mother   . Hypertension Mother   . Hypertension Father   . Cancer Father        lung    Social History:   Social History   Socioeconomic History  . Marital status: Widowed    Spouse name: Not on file  . Number of children: 3  . Years of  education: Not on file  . Highest education level: Not on file  Occupational History  . Not on file  Tobacco Use  . Smoking status: Former Smoker    Packs/day: 1.00    Years: 44.00    Pack years: 44.00    Types: Cigarettes    Quit date: 11/30/2018    Years since quitting: 0.6  . Smokeless tobacco: Never Used  . Tobacco comment: Smoker 1-1.5 PPD every day, 11-20-2018 "i got myself down to half a pack", i am gonna be going on chantix on the 28th"  Vaping Use  . Vaping Use: Never used  Substance and Sexual Activity  . Alcohol use: Not Currently  . Drug use: Not Currently    Types: "Crack"  cocaine    Comment:  "nothing since 01/12/2012"  . Sexual activity: Not Currently    Partners: Male  Other Topics Concern  . Not on file  Social History Narrative  . Not on file   Social Determinants of Health   Financial Resource Strain: Low Risk   . Difficulty of Paying Living Expenses: Not very hard  Food Insecurity:   . Worried About Charity fundraiser in the Last Year:   . Arboriculturist in the Last Year:   Transportation Needs:   . Film/video editor (Medical):   Marland Kitchen Lack of Transportation (Non-Medical):   Physical Activity:   . Days of Exercise per Week:   . Minutes of Exercise per Session:   Stress:   . Feeling of Stress :   Social Connections:   . Frequency of Communication with Friends and Family:   . Frequency of Social Gatherings with Friends and Family:   . Attends Religious Services:   . Active Member of Clubs or Organizations:   . Attends Archivist Meetings:   Marland Kitchen Marital Status:       Allergies:   Allergies  Allergen Reactions  . Keflex [Cephalexin] Rash and Other (See Comments)    Has tolerated amoxicillin since had keflex  . Prednisone Other (See Comments)    "counter reacts"  . Shellfish Allergy Shortness Of Breath, Nausea And Vomiting and Other (See Comments)    Stomach cramps  . Tetracyclines & Related Anaphylaxis, Swelling and Other (See  Comments)    Throat swelling requiring hospitalization  . Dilaudid [Hydromorphone Hcl] Other (See Comments)    "Lethargy"  . Incruse Ellipta [Umeclidinium Bromide] Nausea Only  . Tuberculin Tests Other (See Comments)    False postive    Metabolic Disorder Labs: Lab Results  Component Value Date   HGBA1C 6.2 (H) 03/31/2019   MPG 131.24 03/31/2019   MPG 154.2 05/22/2018   No results found for: PROLACTIN Lab Results  Component Value Date   CHOL 125 12/17/2018   TRIG 261.0 (H) 12/17/2018   HDL 37.80 (L) 12/17/2018   CHOLHDL 3 12/17/2018   VLDL 52.2 (H) 12/17/2018   LDLCALC 62 08/19/2017   Lab Results  Component Value Date   TSH 1.156 03/30/2019    Therapeutic Level Labs: No results found for: LITHIUM No results found for: CBMZ No results found for: VALPROATE  Current Medications: Current Outpatient Medications  Medication Sig Dispense Refill  . acetaminophen (TYLENOL) 500 MG tablet Take 2 tablets (1,000 mg total) by mouth every 8 (eight) hours as needed for mild pain. 30 tablet 0  . acyclovir (ZOVIRAX) 400 MG tablet Take 1 tablet (400 mg total) by mouth 2 (two) times daily. (Patient taking differently: Take 400 mg by mouth daily. ) 180 tablet 3  . albuterol (PROVENTIL) (2.5 MG/3ML) 0.083% nebulizer solution Take 3 mLs (2.5 mg total) by nebulization 2 (two) times daily. And every 6 hours as needed.  DX: J44.9 360 mL 3  . atenolol (TENORMIN) 25 MG tablet TAKE 1 TABLET BY MOUTH  DAILY (Patient taking differently: Take 25 mg by mouth daily. ) 90 tablet 1  . atorvastatin (LIPITOR) 20 MG tablet TAKE 1 TABLET BY MOUTH  DAILY (Patient taking differently: Take 20 mg by mouth daily. ) 90 tablet 1  . azithromycin (ZITHROMAX) 250 MG tablet Zpack taper as directed 6 tablet 0  . budesonide-formoterol (SYMBICORT) 160-4.5 MCG/ACT inhaler Inhale 2 puffs into the lungs 2 (two) times daily. 2 Inhaler  0  . buPROPion (WELLBUTRIN XL) 300 MG 24 hr tablet Take 300 mg by mouth daily.    .  cyclobenzaprine (FLEXERIL) 10 MG tablet TAKE 1 TABLET BY MOUTH AT  BEDTIME (Patient taking differently: Take 10 mg by mouth at bedtime. ) 90 tablet 0  . diphenhydrAMINE (BENADRYL) 25 MG tablet Take 25 mg by mouth 2 (two) times daily.     Marland Kitchen docusate sodium (COLACE) 100 MG capsule Take 1 capsule (100 mg total) by mouth 2 (two) times daily. (Patient taking differently: Take 100 mg by mouth at bedtime. ) 28 capsule 0  . hydrochlorothiazide (HYDRODIURIL) 25 MG tablet TAKE 1 TABLET BY MOUTH  DAILY (Patient taking differently: Take 25 mg by mouth daily. ) 90 tablet 1  . Ipratropium-Albuterol (COMBIVENT RESPIMAT) 20-100 MCG/ACT AERS respimat Inhale 1 puff into the lungs every 6 (six) hours as needed for wheezing. (Patient taking differently: Inhale 1 puff into the lungs in the morning and at bedtime. Take additional puff if needed at bedtime) 3 Inhaler 3  . levothyroxine (SYNTHROID) 25 MCG tablet TAKE 1 TABLET BY MOUTH  DAILY BEFORE BREAKFAST (Patient taking differently: Take 25 mcg by mouth daily. ) 90 tablet 1  . Liniments (SALONPAS ARTHRITIS PAIN RELIEF EX) Apply 1 patch topically daily as needed (to painful sites- remove as directed).     . Melatonin 10 MG TABS Take 10 mg by mouth at bedtime.    . methylPREDNISolone (MEDROL DOSEPAK) 4 MG TBPK tablet As directed 21 tablet 0  . montelukast (SINGULAIR) 10 MG tablet TAKE 1 TABLET BY MOUTH AT  BEDTIME (Patient taking differently: Take 10 mg by mouth at bedtime. ) 90 tablet 3  . Multiple Vitamins-Minerals (ADULT ONE DAILY GUMMIES) CHEW Chew 1 tablet by mouth daily.     . naproxen (NAPROSYN) 500 MG tablet Take 500 mg by mouth daily as needed for mild pain.     Marland Kitchen nystatin-triamcinolone ointment (MYCOLOG) Apply 1 application topically 2 (two) times daily as needed (applied to skin folds/groins/under breast skin irritation rash.). (Patient taking differently: Apply 1 application topically 2 (two) times daily as needed (to skin folds/groin/under breast skin- for  irritation/rashes). ) 100 g 3  . OXYGEN Inhale 2-3 L/min into the lungs See admin instructions. Inhale  2-3 L/min of oxygen into the lungs w/CPAP at bedtime and as needed for shortness of breath with "strenuous activity" during the day    . pantoprazole (PROTONIX) 40 MG tablet TAKE 1 TABLET BY MOUTH  DAILY (Patient taking differently: Take 40 mg by mouth daily. ) 90 tablet 1  . polyethylene glycol (MIRALAX / GLYCOLAX) 17 g packet Take 17 g by mouth daily as needed for mild constipation. 28 packet 0  . pregabalin (LYRICA) 75 MG capsule Take 1 capsule (75 mg total) by mouth 2 (two) times daily. (Patient taking differently: Take 150 mg by mouth at bedtime. ) 180 capsule 1  . QUEtiapine (SEROQUEL) 50 MG tablet Take 1 tablet (50 mg total) by mouth at bedtime. Dose changed to 50mg  from 25mg  30 tablet 1  . roflumilast (DALIRESP) 500 MCG TABS tablet Take 1 tablet (500 mcg total) by mouth daily. 90 tablet 3  . traMADol (ULTRAM) 50 MG tablet Take 1 tablet (50 mg total) by mouth every 6 (six) hours as needed. 20 tablet 0  . traZODone (DESYREL) 100 MG tablet TAKE 1 TABLET BY MOUTH AT  BEDTIME (Patient taking differently: Take 100 mg by mouth at bedtime. ) 90 tablet 3  . venlafaxine  XR (EFFEXOR-XR) 150 MG 24 hr capsule TAKE 1 CAPSULE BY MOUTH  DAILY WITH BREAKFAST (Patient taking differently: Take 150 mg by mouth daily. ) 90 capsule 3   No current facility-administered medications for this visit.     Psychiatric Specialty Exam: Review of Systems  Cardiovascular: Negative for chest pain.  Psychiatric/Behavioral: Negative for substance abuse and suicidal ideas.    There were no vitals taken for this visit.There is no height or weight on file to calculate BMI.  General Appearance:casual  Eye Contact:  fair  Speech:  Slow  Volume:  Decreased  Mood: fair  Affect:  Congruent  Thought Process:  Goal Directed  Orientation:  Full (Time, Place, and Person)  Thought Content:  rational  Suicidal Thoughts:  No   Homicidal Thoughts:  No  Memory:  Immediate;   Fair Recent;   Fair  Judgement:  Fair  Insight:  Shallow  Psychomotor Activity:  Decreased  Concentration:  Concentration: Fair and Attention Span: Fair  Recall:  [employer] of Knowledge:Fair  Language: Fair  Akathisia:  No  Handed:    AIMS (if indicated): no involuntary movements  Assets:  Desire for Improvement Leisure Time Social Support  ADL's: limited due to recent surgery  Cognition: WNL  Sleep:  variable   Screenings: Mini-Mental     Clinical Support from 04/06/2018 in [place]  Total Score (max 30 points ) 29    PHQ2-9     Clinical Support from 05/24/2019 in [place] at [facility] Patient Outreach Telephone from 12/18/2018 in [place] Patient Outreach Telephone from 11/18/2018 in [facility] from 04/06/2018 in [place] Visit from 11/28/2016 in [place]  PHQ-2 Total Score 2 3 1 2 1   PHQ-9 Total Score 13 -- -- 6 --      Assessment and Plan: as follows  Mood disorder unspecified: relavant to multiple medical concerns and past history of using drugs, alcohol Recurrent depressive episodes; doing better, continue effexor  Continue seroquel for depression and hallucinations. Doing better Hallucinations may be part of Grief, PTSD or depression or possible part of schizoaffective disorder. Continue seroquel  Also takes trazadone if needed Risk side effects discussed Continue therapy to work on conflicts and mood     I discussed the assessment and treatment plan with the patient. The patient was provided an opportunity to ask questions and all were answered. The patient agreed with the plan and demonstrated an understanding of the instructions.   The patient was advised to call back or seek an in-person evaluation if the symptoms worsen or if the condition fails to improve as  anticipated. Fu 8 weeks,  Non face to face time spent: 66min   [name], MD 6/14/20212:05 PM

## 2019-08-10 ENCOUNTER — Ambulatory Visit (INDEPENDENT_AMBULATORY_CARE_PROVIDER_SITE_OTHER): Payer: Medicare Other | Admitting: Licensed Clinical Social Worker

## 2019-08-10 DIAGNOSIS — F411 Generalized anxiety disorder: Secondary | ICD-10-CM

## 2019-08-10 DIAGNOSIS — F0630 Mood disorder due to known physiological condition, unspecified: Secondary | ICD-10-CM | POA: Diagnosis not present

## 2019-08-10 DIAGNOSIS — F4312 Post-traumatic stress disorder, chronic: Secondary | ICD-10-CM | POA: Diagnosis not present

## 2019-08-10 DIAGNOSIS — R443 Hallucinations, unspecified: Secondary | ICD-10-CM
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
## 2019-08-16 ENCOUNTER — Ambulatory Visit: Payer: Medicare Other | Admitting: Allergy

## 2019-08-17 ENCOUNTER — Telehealth: Payer: Self-pay | Admitting: Acute Care

## 2019-08-17 NOTE — Telephone Encounter (Signed)
Approvedon May 25 Request Reference Number: [id number]. DALIRESP TAB 500MCG is approved through 02/25/2020. Your patient may now fill this prescription and it will be covered.  Patient verified that she does not get this medication from [facility], she gets this and all of her other medications through the manufacturer except for her singular.  Nothing further needed.

## 2019-08-18 DIAGNOSIS — J441 Chronic obstructive pulmonary disease with (acute) exacerbation: Secondary | ICD-10-CM | POA: Diagnosis not present

## 2019-08-18 DIAGNOSIS — G4733 Obstructive sleep apnea (adult) (pediatric): Secondary | ICD-10-CM | POA: Diagnosis not present

## 2019-08-18 DIAGNOSIS — J449 Chronic obstructive pulmonary disease, unspecified: Secondary | ICD-10-CM | POA: Diagnosis not present

## 2019-08-19 DIAGNOSIS — H1011 Acute atopic conjunctivitis, right eye: Secondary | ICD-10-CM | POA: Diagnosis not present

## 2019-08-19 DIAGNOSIS — H2512 Age-related nuclear cataract, left eye: Secondary | ICD-10-CM | POA: Diagnosis not present

## 2019-08-19 DIAGNOSIS — H43811 Vitreous degeneration, right eye: Secondary | ICD-10-CM | POA: Diagnosis not present

## 2019-08-19 DIAGNOSIS — H538 Other visual disturbances: Secondary | ICD-10-CM | POA: Diagnosis not present

## 2019-08-23 DIAGNOSIS — J441 Chronic obstructive pulmonary disease with (acute) exacerbation: Secondary | ICD-10-CM | POA: Diagnosis not present

## 2019-08-23 DIAGNOSIS — J449 Chronic obstructive pulmonary disease, unspecified: Secondary | ICD-10-CM | POA: Diagnosis not present

## 2019-08-23 DIAGNOSIS — G4733 Obstructive sleep apnea (adult) (pediatric): Secondary | ICD-10-CM | POA: Diagnosis not present

## 2019-08-23 DIAGNOSIS — J9611 Chronic respiratory failure with hypoxia: Secondary | ICD-10-CM | POA: Diagnosis not present

## 2019-08-24 ENCOUNTER — Ambulatory Visit (HOSPITAL_COMMUNITY): Payer: Medicare Other | Admitting: Licensed Clinical Social Worker

## 2019-08-27 ENCOUNTER — Other Ambulatory Visit (HOSPITAL_COMMUNITY): Payer: Self-pay | Admitting: Psychiatry

## 2019-09-02 NOTE — Telephone Encounter (Signed)
Order placed for referral to pulmonary rehab. Nothing further needed.

## 2019-09-06 ENCOUNTER — Telehealth (HOSPITAL_COMMUNITY): Payer: Self-pay | Admitting: *Deleted

## 2019-09-06 ENCOUNTER — Encounter (HOSPITAL_COMMUNITY): Payer: Self-pay | Admitting: *Deleted

## 2019-09-06 ENCOUNTER — Telehealth (HOSPITAL_COMMUNITY): Payer: Self-pay

## 2019-09-06 NOTE — Progress Notes (Signed)
Received referral from Dr. Elsworth Soho for this pt to participate in pulmonary rehab with the the diagnosis of COPD Stage 3.  Pt had PFT on 03/17/19 which shows FEV1 post predictive 43. Clinical review of pt follow up appt on 07/28/19 with Geraldo Pitter NP with Dr. Elsworth Soho  Pulmonary office note as well as reviewed pt follow up appt with Dr. Benjamine Sprague.  There is a notation on the pulmonary office note for 'confusion:  However this is not noted on subsequent progress notes.  Unsure if this could be related to her history of depression, PTSD and anxiety.  Plan to call pt to discuss further.  Pt with Covid Risk Score - 4.  Will forward to support staff for verification of insurance eligibility/benefits with pt consent. Will determine based upon telephone call with pt appropriateness to participate in a group exercise program. Maurice Small RN, BSN Cardiac and Pulmonary Rehab Nurse Navigator

## 2019-09-06 NOTE — Telephone Encounter (Signed)
Pt insurance is active and benefits verified through [facility] Medicare Co-pay $20, DED 0/0 met, out of pocket $4,500/$1,352.81 met, co-insurance 0%. no pre-authorization required. Passport, [place], REF# [id number]

## 2019-09-06 NOTE — Telephone Encounter (Signed)
Received referral for this pt to participate in pulmonary rehab.  Called and left message for pt.  Requested call back.  Contact information provided. Cherre Huger, BSN Cardiac and Pulmonary Rehab Nurse Navigator '

## 2019-09-06 NOTE — Telephone Encounter (Signed)
Connected with pt via phone while pt on the line with support staff.  Talked with pt who is visiting family up [place] about her "confusion".  Pt states that she looses focus easily and will lose her train of thought when talking.  Pt denies any disorientation.  Pt well aware of her name, place and time. Pt drives herself where she needs to go and doesn't get lost.  Pt is excited about the pulmonary rehab program. Pt will return to the [place] area first of August.  Pt will be contacted by pulmonary rehab staff for scheduling. Cherre Huger, BSN Cardiac and Training and development officer

## 2019-09-07 ENCOUNTER — Ambulatory Visit (INDEPENDENT_AMBULATORY_CARE_PROVIDER_SITE_OTHER): Payer: Medicare Other | Admitting: Licensed Clinical Social Worker

## 2019-09-07 ENCOUNTER — Other Ambulatory Visit: Payer: Self-pay | Admitting: Internal Medicine

## 2019-09-07 ENCOUNTER — Other Ambulatory Visit: Payer: Self-pay | Admitting: Pulmonary Disease

## 2019-09-07 DIAGNOSIS — F0630 Mood disorder due to known physiological condition, unspecified: Secondary | ICD-10-CM

## 2019-09-07 DIAGNOSIS — R443 Hallucinations, unspecified: Secondary | ICD-10-CM

## 2019-09-07 DIAGNOSIS — F411 Generalized anxiety disorder: Secondary | ICD-10-CM | POA: Diagnosis not present

## 2019-09-07 DIAGNOSIS — F4312 Post-traumatic stress disorder, chronic: Secondary | ICD-10-CM | POA: Diagnosis not present
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
## 2019-09-07 NOTE — Telephone Encounter (Signed)
Called patient to see if she is interested in the Pulmonary Rehab Program. Patient expressed interest. Explained scheduling process and went over insurance, patient verbalized understanding. Someone from our pulmonary rehab staff will contact pt at a later time. 

## 2019-09-08 DIAGNOSIS — J441 Chronic obstructive pulmonary disease with (acute) exacerbation: Secondary | ICD-10-CM | POA: Diagnosis not present

## 2019-09-08 DIAGNOSIS — J9611 Chronic respiratory failure with hypoxia: Secondary | ICD-10-CM | POA: Diagnosis not present

## 2019-09-08 DIAGNOSIS — J449 Chronic obstructive pulmonary disease, unspecified: Secondary | ICD-10-CM | POA: Diagnosis not present

## 2019-09-21 ENCOUNTER — Other Ambulatory Visit: Payer: Self-pay

## 2019-09-21 ENCOUNTER — Ambulatory Visit (INDEPENDENT_AMBULATORY_CARE_PROVIDER_SITE_OTHER): Payer: Medicare Other | Admitting: Licensed Clinical Social Worker

## 2019-09-21 DIAGNOSIS — F411 Generalized anxiety disorder: Secondary | ICD-10-CM

## 2019-09-21 DIAGNOSIS — F4312 Post-traumatic stress disorder, chronic: Secondary | ICD-10-CM | POA: Diagnosis not present

## 2019-09-21 DIAGNOSIS — R443 Hallucinations, unspecified: Secondary | ICD-10-CM

## 2019-09-21 DIAGNOSIS — F0630 Mood disorder due to known physiological condition, unspecified: Secondary | ICD-10-CM | POA: Diagnosis not present

## 2019-09-21 NOTE — Progress Notes (Signed)
Virtual Visit via Video Note  Therapist-office Patient-home I connected with Joan [name] on 09/21/19 at  1:00 PM EDT by a video enabled telemedicine application and verified that I am speaking with the correct person using two identifiers.   I discussed the limitations of evaluation and management by telemedicine and the availability of in person appointments. The patient expressed understanding and agreed to proceed.   I discussed the assessment and treatment plan with the patient. The patient was provided an opportunity to ask questions and all were answered. The patient agreed with the plan and demonstrated an understanding of the instructions.   The patient was advised to call back or seek an in-person evaluation if the symptoms worsen or if the condition fails to improve as anticipated.  I provided 55 minutes of non-face-to-face time during this encounter.  THERAPIST PROGRESS NOTE  Session Time: 1:00 PM to 1:55 PM  Participation Level: Active  Behavioral Response: CasualAlertapprpriate  Type of Therapy: Individual Therapy  Treatment Goals addressed:  PTSD, coping, hallucinations  Interventions: Solution Focused, Strength-based, Supportive and Other: coping  Summary: Joan [name] is a 59 y.o. female who presents with will start class in August where an exercise class with lung disorders, treat her how to breath right excited and curious about it. 2-3 x a week for month of August. Maybe can meet people who don't have drugs and alcohol issues. Don't have friends except cat and dog getting along better with brother.  Patient recognizes the need to try to develop more supports. Wants a lung transplant going to bring it up at next appointment. Because lungs are horrible. Get out of bed and can't breath. Wears C-PAP and oxygen and still struggles with it. Knows it is going to get better but some days just not. Tells herself to take a break, take a breath. This works although  some days restless and wants everything done before it hits. Doesn't work that work. Now she is practicing using inhaler before activities, take her time no matter how long depending on day how much she accomplishes sometimes more than one thing. According to her and how feel. Would like for a walk start go in back yard. Lazy doesn't know if lack of oxygen thinks it is a little bit of both. 3-4 walkers two canes. Gets tired easy, legs get tried because not working them. Thinks will start off length of yard and back. Every day if she does this she realizes she will build a pattern. Lung program good start to get her excited about other things. Will be looking at Information on programs. Discussed financial stressors. Trying to live off of income from disability a struggle and stressful. Discussed going to church and enjoying it.Reviewed fears from driving experience.  Emphasis this is more patient gets used to it will help this fears.       Suicidal/Homicidal: No  Therapist Response: Therapist reviewed symptoms, facilitated expression of thoughts and feelings, provided positive feedback for patient's motivation to get into program that will help her with medical issues, her breathing as well as being a start for her to connect with people socially seems like a good place for her to try to make social connections.  Therapist pointed out patient's strength includes therapist impression of patient is being social.  Encourage patient taking small steps and building habits will help her get started, encouraging her to take small walks outside having double benefit of exercise and being outside.  Reinforced patient's own insight  of developing patterns will help her make positive changes.  Explored ways for patient to connect with other programs that would be good for her such as free past because of her Medicare and Silver sneakers and explored options for her with this.  Help patient to process feelings specifically  around's financial stressors utilize active listening, open questions and supportive interventions as well as problem solving around the stressors.   Plan: Return again in in 2 weeks.2.Therapist work with patient on stress management in particular family stressors, encourage patient with healthy coping.3.  Patient to start clinic in August for healthy breathing. 4.Patient take positive steps for her self-care such as gym, walking  Diagnosis: Axis I:  PTSD, hallucinations, GAD, Mood Disorder in conditions classified elsewhere    Axis II: No diagnosis    [name], LCSW 09/21/2019

## 2019-09-22 DIAGNOSIS — J9611 Chronic respiratory failure with hypoxia: Secondary | ICD-10-CM | POA: Diagnosis not present

## 2019-09-22 DIAGNOSIS — J441 Chronic obstructive pulmonary disease with (acute) exacerbation: Secondary | ICD-10-CM | POA: Diagnosis not present

## 2019-09-22 DIAGNOSIS — J449 Chronic obstructive pulmonary disease, unspecified: Secondary | ICD-10-CM | POA: Diagnosis not present

## 2019-09-22 DIAGNOSIS — G4733 Obstructive sleep apnea (adult) (pediatric): Secondary | ICD-10-CM | POA: Diagnosis not present

## 2019-09-24 ENCOUNTER — Other Ambulatory Visit (HOSPITAL_COMMUNITY): Payer: Self-pay | Admitting: Psychiatry

## 2019-10-05 ENCOUNTER — Ambulatory Visit: Payer: Medicare Other | Admitting: Allergy & Immunology

## 2019-10-05 ENCOUNTER — Telehealth (HOSPITAL_COMMUNITY): Payer: Self-pay

## 2019-10-05 ENCOUNTER — Ambulatory Visit (INDEPENDENT_AMBULATORY_CARE_PROVIDER_SITE_OTHER): Payer: Medicare Other | Admitting: Licensed Clinical Social Worker

## 2019-10-05 ENCOUNTER — Telehealth (HOSPITAL_COMMUNITY): Payer: Self-pay | Admitting: Internal Medicine

## 2019-10-05 DIAGNOSIS — R443 Hallucinations, unspecified: Secondary | ICD-10-CM

## 2019-10-05 DIAGNOSIS — F411 Generalized anxiety disorder: Secondary | ICD-10-CM | POA: Diagnosis not present

## 2019-10-05 DIAGNOSIS — F0630 Mood disorder due to known physiological condition, unspecified: Secondary | ICD-10-CM

## 2019-10-05 DIAGNOSIS — F4312 Post-traumatic stress disorder, chronic: Secondary | ICD-10-CM
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
## 2019-10-09 DIAGNOSIS — J441 Chronic obstructive pulmonary disease with (acute) exacerbation: Secondary | ICD-10-CM | POA: Diagnosis not present

## 2019-10-09 DIAGNOSIS — J9611 Chronic respiratory failure with hypoxia: Secondary | ICD-10-CM | POA: Diagnosis not present

## 2019-10-09 DIAGNOSIS — J449 Chronic obstructive pulmonary disease, unspecified: Secondary | ICD-10-CM | POA: Diagnosis not present

## 2019-10-11 ENCOUNTER — Telehealth (INDEPENDENT_AMBULATORY_CARE_PROVIDER_SITE_OTHER): Payer: Medicare Other | Admitting: Psychiatry

## 2019-10-11 ENCOUNTER — Encounter (HOSPITAL_COMMUNITY): Payer: Self-pay | Admitting: Psychiatry

## 2019-10-11 DIAGNOSIS — F411 Generalized anxiety disorder: Secondary | ICD-10-CM

## 2019-10-11 DIAGNOSIS — F4312 Post-traumatic stress disorder, chronic: Secondary | ICD-10-CM | POA: Diagnosis not present

## 2019-10-11 DIAGNOSIS — F0630 Mood disorder due to known physiological condition, unspecified: Secondary | ICD-10-CM | POA: Diagnosis not present

## 2019-10-11 NOTE — Progress Notes (Signed)
[facility] Follow up visit  Patient Identification: Joan [name] MRN:  [id number] Date of Evaluation:  10/11/2019 Referral Source:Therapist [name] Chief Complaint:    depression follow up  Visit Diagnosis:    ICD-10-CM   1. Mood disorder in conditions classified elsewhere  F06.30   2. GAD (generalized anxiety disorder)  F41.1   3. Post-traumatic stress disorder, chronic  F43.12       I connected with [name] on 10/11/19 at  1:00 PM EDT by a video enabled telemedicine application and verified that I am speaking with the correct person using two identifiers.      Patient location : home via tele video  Provider location : home   I discussed the limitations of evaluation and management by telemedicine and the availability of in person appointments. The patient expressed understanding and agreed to proceed.  History of Present Illness: Patient is a 59 years old currently widow Caucasian female referred by her therapist for management of mood symptoms, depression possible PTSD and anxiety  Some better regarding seeing shadows, seroquel has helped Still does therapy with Stanton Kidney for grief and trauma. Some hallucinations related after husbands death but better No tremors Also on lyrica would be helping additionally as mood stabilizer explained  Aggravating factor; difficult childhood, Grief, difficult relationship with middle kid, hip replacement Modifying factor:siblings, but still has difficult time with them   Duration adult life Severity of depression: denies hopelessness       Past Psychiatric History: depression, drug use  Previous Psychotropic Medications: Yes   Substance Abuse History in the last 12 months:  No.  Consequences of Substance Abuse: history of extensive drug use uptil 8 years ago and have been in rehab programs  Past Medical History:  Past Medical History:  Diagnosis Date  . Acute respiratory failure with hypoxia ([place])   . Alcoholism  and drug addiction in family   . Anxiety   . Appendicitis   . Arthritis    "knees, hips, lower back" (09/25/2016)  . Asthma   . Bipolar disorder ([place])   . Chronic bronchitis ([place])    "all the time" (09/25/2016)  . Chronic leg pain    RLE  . Chronic lower back pain   . COPD (chronic obstructive pulmonary disease) ([place])   . Depression   . Diabetes mellitus without complication ([place])   . Diabetic neuropathy ([place])   . Early cataracts, bilateral   . Emphysema of lung ([place])   . GERD (gastroesophageal reflux disease)   . Headache   . Hepatitis C    "treated back in 2001-2002"  . High cholesterol   . Hypertension   . Hypothyroidism   . Incarcerated ventral hernia 04/04/2017  . On home oxygen therapy    "2L; at nighttime" (04/04/2017)  . OSA on CPAP    CPAP with Oxygen 2 liters  . Paranoid ([place])   . Pneumonia    "all the time" (09/25/2016)  . Positive PPD    with negative chest x ray  . Tachycardia    patient reports with exertion ; denies any other conditions   . Thyroid disease   . Wears glasses   . Wears partial dentures     Past Surgical History:  Procedure Laterality Date  . BACK SURGERY    . BIOPSY  11/24/2018   Procedure: BIOPSY;  Surgeon: Thornton Park, MD;  Location: [facility];  Service: Gastroenterology;;  . CARDIAC CATHETERIZATION    . COLONOSCOPY    .  COLONOSCOPY WITH PROPOFOL N/A 11/24/2018   Procedure: COLONOSCOPY WITH PROPOFOL;  Surgeon: Thornton Park, MD;  Location: [facility];  Service: Gastroenterology;  Laterality: N/A;  . CYST EXCISION     removal of cyst in sinuses  . DILATION AND CURETTAGE OF UTERUS    . ESOPHAGOGASTRODUODENOSCOPY (EGD) WITH PROPOFOL N/A 11/24/2018   Procedure: ESOPHAGOGASTRODUODENOSCOPY (EGD) WITH PROPOFOL;  Surgeon: Thornton Park, MD;  Location: [facility];  Service: Gastroenterology;  Laterality: N/A;  . FOOT SURGERY Bilateral    bone removed from 4th and 5 th toes and put in pin then took pin out  . HERNIA REPAIR    .  INCISION AND DRAINAGE  ~ 2014/2015   "removed mesh & infection"  . INCISIONAL HERNIA REPAIR N/A 04/04/2017   Procedure: LAPAROSCOPIC INCISIONAL HERNIA REPAIR WITH MESH;  Surgeon: Fanny Skates, MD;  Location: [facility];  Service: General;  Laterality: N/A;  . LACERATION REPAIR Right    repair wrist artery from laceration from ice skate  . LAPAROSCOPIC APPENDECTOMY N/A 03/31/2019   Procedure: APPENDECTOMY LAPAROSCOPIC;  Surgeon: Ralene Ok, MD;  Location: [facility];  Service: General;  Laterality: N/A;  . LAPAROSCOPIC APPENDECTOMY N/A 07/09/2019   Procedure: APPENDECTOMY LAPAROSCOPIC;  Surgeon: Ralene Ok, MD;  Location: [facility];  Service: General;  Laterality: N/A;  . LAPAROSCOPIC INCISIONAL / UMBILICAL / [place]  04/04/2017   LAPAROSCOPIC INCISIONAL HERNIA REPAIR WITH MESH  . LIPOMA EXCISION Right    fattty lipoma in neck  . NASAL SEPTUM SURGERY    . POSTERIOR LUMBAR FUSION  2015/2016 X 2   "added rods and screws"  . TOTAL HIP ARTHROPLASTY Right 02/05/2017   Procedure: RIGHT TOTAL HIP ARTHROPLASTY;  Surgeon: [name], MD;  [facility];  Service: Orthopedics;  Laterality: Right;  . TOTAL HIP ARTHROPLASTY Left 06/18/2017   Procedure: LEFT TOTAL HIP ARTHROPLASTY;  Surgeon: [name], MD;  Location[facility];  Service: Orthopedics;  Laterality: Left;  . TOTAL HIP REVISION Left 01/26/2019   Procedure: TOTAL HIP REVISION;  Surgeon: Paralee Cancel, MD;  Location: [facility];  Service: Orthopedics;  Laterality: Left;  2 hrs  . TUBAL LIGATION    . UMBILICAL HERNIA REPAIR  ~ 2013   w/mesh  . UPPER GI ENDOSCOPY      Family Psychiatric History: FAther : alcohol use  Family History:  Family History  Problem Relation Age of Onset  . Diabetes Mother   . Hypertension Mother   . Hypertension Father   . Cancer Father        lung    Social History:   Social History   Socioeconomic History  . Marital status: Widowed    Spouse name: Not on file  . Number of children:  3  . Years of education: Not on file  . Highest education level: Not on file  Occupational History  . Not on file  Tobacco Use  . Smoking status: Former Smoker    Packs/day: 1.00    Years: 44.00    Pack years: 44.00    Types: Cigarettes    Quit date: 11/30/2018    Years since quitting: 0.8  . Smokeless tobacco: Never Used  . Tobacco comment: Smoker 1-1.5 PPD every day, 11-20-2018 "i got myself down to half a pack", i am gonna be going on chantix on the 28th"  Vaping Use  . Vaping Use: Never used  Substance and Sexual Activity  . Alcohol use: Not Currently  . Drug use: Not Currently  Types: "Crack" cocaine    Comment:  "nothing since 01/12/2012"  . Sexual activity: Not Currently    Partners: Male  Other Topics Concern  . Not on file  Social History Narrative  . Not on file   Social Determinants of Health   Financial Resource Strain: Low Risk   . Difficulty of Paying Living Expenses: Not very hard  Food Insecurity:   . Worried About Charity fundraiser in the Last Year:   . Arboriculturist in the Last Year:   Transportation Needs:   . Film/video editor (Medical):   Marland Kitchen Lack of Transportation (Non-Medical):   Physical Activity:   . Days of Exercise per Week:   . Minutes of Exercise per Session:   Stress:   . Feeling of Stress :   Social Connections:   . Frequency of Communication with Friends and Family:   . Frequency of Social Gatherings with Friends and Family:   . Attends Religious Services:   . Active Member of Clubs or Organizations:   . Attends Archivist Meetings:   Marland Kitchen Marital Status:       Allergies:   Allergies  Allergen Reactions  . Keflex [Cephalexin] Rash and Other (See Comments)    Has tolerated amoxicillin since had keflex  . Prednisone Other (See Comments)    "counter reacts"  . Shellfish Allergy Shortness Of Breath, Nausea And Vomiting and Other (See Comments)    Stomach cramps  . Tetracyclines & Related Anaphylaxis, Swelling  and Other (See Comments)    Throat swelling requiring hospitalization  . Dilaudid [Hydromorphone Hcl] Other (See Comments)    "Lethargy"  . Incruse Ellipta [Umeclidinium Bromide] Nausea Only  . Tuberculin Tests Other (See Comments)    False postive    Metabolic Disorder Labs: Lab Results  Component Value Date   HGBA1C 6.2 (H) 03/31/2019   MPG 131.24 03/31/2019   MPG 154.2 05/22/2018   No results found for: PROLACTIN Lab Results  Component Value Date   CHOL 125 12/17/2018   TRIG 261.0 (H) 12/17/2018   HDL 37.80 (L) 12/17/2018   CHOLHDL 3 12/17/2018   VLDL 52.2 (H) 12/17/2018   LDLCALC 62 08/19/2017   Lab Results  Component Value Date   TSH 1.156 03/30/2019    Therapeutic Level Labs: No results found for: LITHIUM No results found for: CBMZ No results found for: VALPROATE  Current Medications: Current Outpatient Medications  Medication Sig Dispense Refill  . acetaminophen (TYLENOL) 500 MG tablet Take 2 tablets (1,000 mg total) by mouth every 8 (eight) hours as needed for mild pain. 30 tablet 0  . acyclovir (ZOVIRAX) 400 MG tablet TAKE 1 TABLET BY MOUTH  TWICE DAILY 180 tablet 1  . albuterol (PROVENTIL) (2.5 MG/3ML) 0.083% nebulizer solution Take 3 mLs (2.5 mg total) by nebulization 2 (two) times daily. And every 6 hours as needed.  DX: J44.9 360 mL 3  . atenolol (TENORMIN) 25 MG tablet TAKE 1 TABLET BY MOUTH  DAILY 90 tablet 1  . atorvastatin (LIPITOR) 20 MG tablet TAKE 1 TABLET BY MOUTH  DAILY 90 tablet 1  . azithromycin (ZITHROMAX) 250 MG tablet Zpack taper as directed 6 tablet 0  . budesonide-formoterol (SYMBICORT) 160-4.5 MCG/ACT inhaler Inhale 2 puffs into the lungs 2 (two) times daily. 2 Inhaler 0  . buPROPion (WELLBUTRIN XL) 300 MG 24 hr tablet Take 300 mg by mouth daily.    . cyclobenzaprine (FLEXERIL) 10 MG tablet TAKE 1 TABLET BY MOUTH AT  BEDTIME 90 tablet 1  . diphenhydrAMINE (BENADRYL) 25 MG tablet Take 25 mg by mouth 2 (two) times daily.     Marland Kitchen docusate  sodium (COLACE) 100 MG capsule Take 1 capsule (100 mg total) by mouth 2 (two) times daily. (Patient taking differently: Take 100 mg by mouth at bedtime. ) 28 capsule 0  . hydrochlorothiazide (HYDRODIURIL) 25 MG tablet TAKE 1 TABLET BY MOUTH  DAILY 90 tablet 1  . Ipratropium-Albuterol (COMBIVENT RESPIMAT) 20-100 MCG/ACT AERS respimat Inhale 1 puff into the lungs every 6 (six) hours as needed for wheezing. (Patient taking differently: Inhale 1 puff into the lungs in the morning and at bedtime. Take additional puff if needed at bedtime) 3 Inhaler 3  . levothyroxine (SYNTHROID) 25 MCG tablet TAKE 1 TABLET BY MOUTH  DAILY BEFORE BREAKFAST (Patient taking differently: Take 25 mcg by mouth daily. ) 90 tablet 1  . Liniments (SALONPAS ARTHRITIS PAIN RELIEF EX) Apply 1 patch topically daily as needed (to painful sites- remove as directed).     . Melatonin 10 MG TABS Take 10 mg by mouth at bedtime.    . methylPREDNISolone (MEDROL DOSEPAK) 4 MG TBPK tablet As directed 21 tablet 0  . montelukast (SINGULAIR) 10 MG tablet TAKE 1 TABLET BY MOUTH AT  BEDTIME (Patient taking differently: Take 10 mg by mouth at bedtime. ) 90 tablet 3  . Multiple Vitamins-Minerals (ADULT ONE DAILY GUMMIES) CHEW Chew 1 tablet by mouth daily.     . naproxen (NAPROSYN) 500 MG tablet Take 500 mg by mouth daily as needed for mild pain.     Marland Kitchen nystatin-triamcinolone ointment (MYCOLOG) Apply 1 application topically 2 (two) times daily as needed (applied to skin folds/groins/under breast skin irritation rash.). (Patient taking differently: Apply 1 application topically 2 (two) times daily as needed (to skin folds/groin/under breast skin- for irritation/rashes). ) 100 g 3  . OXYGEN Inhale 2-3 L/min into the lungs See admin instructions. Inhale  2-3 L/min of oxygen into the lungs w/CPAP at bedtime and as needed for shortness of breath with "strenuous activity" during the day    . pantoprazole (PROTONIX) 40 MG tablet TAKE 1 TABLET BY MOUTH  DAILY 90  tablet 1  . polyethylene glycol (MIRALAX / GLYCOLAX) 17 g packet Take 17 g by mouth daily as needed for mild constipation. 28 packet 0  . pregabalin (LYRICA) 75 MG capsule Take 1 capsule (75 mg total) by mouth 2 (two) times daily. (Patient taking differently: Take 150 mg by mouth at bedtime. ) 180 capsule 1  . QUEtiapine (SEROQUEL) 50 MG tablet TAKE 1 TABLET BY MOUTH AT  BEDTIME 30 tablet 1  . roflumilast (DALIRESP) 500 MCG TABS tablet Take 1 tablet (500 mcg total) by mouth daily. 90 tablet 3  . traMADol (ULTRAM) 50 MG tablet Take 1 tablet (50 mg total) by mouth every 6 (six) hours as needed. 20 tablet 0  . traZODone (DESYREL) 100 MG tablet TAKE 1 TABLET BY MOUTH AT  BEDTIME (Patient taking differently: Take 100 mg by mouth at bedtime. ) 90 tablet 3  . venlafaxine XR (EFFEXOR-XR) 150 MG 24 hr capsule TAKE 1 CAPSULE BY MOUTH  DAILY WITH BREAKFAST (Patient taking differently: Take 150 mg by mouth daily. ) 90 capsule 3   No current facility-administered medications for this visit.     Psychiatric Specialty Exam: Review of Systems  Cardiovascular: Negative for chest pain.  Psychiatric/Behavioral: Negative for substance abuse and suicidal ideas.    There were no vitals taken for  this visit.There is no height or weight on file to calculate BMI.  General Appearance:casual  Eye Contact:  fair  Speech:  Slow  Volume:  Decreased  Mood: fair  Affect:  Congruent  Thought Process:  Goal Directed  Orientation:  Full (Time, Place, and Person)  Thought Content:  rational  Suicidal Thoughts:  No  Homicidal Thoughts:  No  Memory:  Immediate;   Fair Recent;   Fair  Judgement:  Fair  Insight:  Shallow  Psychomotor Activity:  Decreased  Concentration:  Concentration: Fair and Attention Span: Fair  Recall:  [employer] of Knowledge:Fair  Language: Fair  Akathisia:  No  Handed:    AIMS (if indicated): no involuntary movements  Assets:  Desire for Improvement Leisure Time Social Support  ADL's:  limited due to recent surgery  Cognition: WNL  Sleep:  variable   Screenings: Mini-Mental     Clinical Support from 04/06/2018 in [place]  Total Score (max 30 points ) 29    PHQ2-9     Clinical Support from 05/24/2019 in [place] at [facility] Patient Outreach Telephone from 12/18/2018 in [facility] Patient Outreach Telephone from 11/18/2018 in [facility] from 04/06/2018 in [facility] Visit from 11/28/2016 in [facility]  PHQ-2 Total Score 2 3 1 2 1   PHQ-9 Total Score 13 -- -- 6 --      Assessment and Plan: as follows  Mood disorder unspecified: relavant to multiple medical concerns and past history of using drugs, alcohol Recurrent depressive episodes; not worse, doing fair, continue effexor Continue seroquel for depression and hallucinations. Doing better Hallucinations may be part of Grief, PTSD or depression or possible part of schizoaffective disorder. Continue seroquel  Also takes trazadone if needed Risk side effects discussed Continue therapy to work on conflicts and mood     I discussed the assessment and treatment plan with the patient. The patient was provided an opportunity to ask questions and all were answered. The patient agreed with the plan and demonstrated an understanding of the instructions.   The patient was advised to call back or seek an in-person evaluation if the symptoms worsen or if the condition fails to improve as anticipated. Fu 8 weeks,  Non face to face time spent: 75min   [name], MD 8/16/20211:07 PM

## 2019-10-12 ENCOUNTER — Telehealth (HOSPITAL_COMMUNITY): Payer: Self-pay | Admitting: *Deleted

## 2019-10-14 ENCOUNTER — Telehealth (HOSPITAL_COMMUNITY): Payer: Self-pay

## 2019-10-14 ENCOUNTER — Other Ambulatory Visit: Payer: Self-pay | Admitting: Pulmonary Disease

## 2019-10-14 ENCOUNTER — Telehealth (HOSPITAL_COMMUNITY): Payer: Self-pay | Admitting: *Deleted

## 2019-10-14 ENCOUNTER — Telehealth (HOSPITAL_COMMUNITY): Payer: Self-pay | Admitting: Internal Medicine

## 2019-10-19 ENCOUNTER — Telehealth (HOSPITAL_COMMUNITY): Payer: Self-pay | Admitting: *Deleted

## 2019-10-19 ENCOUNTER — Other Ambulatory Visit: Payer: Self-pay

## 2019-10-19 ENCOUNTER — Ambulatory Visit: Payer: Medicare Other | Admitting: Pharmacist

## 2019-10-19 DIAGNOSIS — J449 Chronic obstructive pulmonary disease, unspecified: Secondary | ICD-10-CM

## 2019-10-19 DIAGNOSIS — I10 Essential (primary) hypertension: Secondary | ICD-10-CM

## 2019-10-19 DIAGNOSIS — E785 Hyperlipidemia, unspecified: Secondary | ICD-10-CM

## 2019-10-19 NOTE — Chronic Care Management (AMB) (Signed)
Chronic Care Management Pharmacy  Name: Joan [name]  MRN: [id number] DOB: 1961-02-09  Chief Complaint/ HPI  Joan [name],  59 y.o. , female presents for their Follow-Up CCM visit with the clinical pharmacist via telephone due to COVID-19 Pandemic.  PCP : [name], MD  Their chronic conditions include: HTN, HLD COPD, GERD, DM, hypothyroidism, arthritis, anxiety/depression  Office Visits: 06/16/19 Dr Sharlet Salina OV: head pain, allergy, nerve pain. Referred to allergy for possible allergy shots. Ordered CT head/neck. Rx'd Lyrica 75 mg BID. Still needs repeat surgery for appendicitis.   04/29/19 Dr Sharlet Salina OV: hospital f/u (appendecitis, unsuccessful appendectomy). Constipation worse, has f/u with surgeon.  Consult Visit: 10/11/19 Dr Benjamine Sprague (behavioral health): continue meds and therapy for grief, PTSD. Seeing counselor every 2 weeks.  08/09/19 Dr Benjamine Sprague (behavioral health): continue meds, therapy.  07/19/19 Dr De Nurse (behavioral health): increased seroquel to 50 mg.  07/09/19 Hospital admission: appendectomy   05/11/19 Dr Dellis Filbert (OB/GYN): normal PAP. Continue Vitamin D and calcium, schedule BMD.   05/10/19 Dr De Nurse (psych): pt experiencing hallucinations, may be grief, PTSD, schizoaffective. Reduced seroquel to 25 mg at pt request.   05/05/19 ED visit: chest pain, workup WNL, GI cocktail improved pain.  03/30/19-04/14/19 hospital admission: acute appendicitis w/ ? Perforation  03/17/19 NP Groce (pulmonary): decrease Daliresp to 250 mcg daily, monitor LFTs. Quit smoking with Chantix.   02/13/19 ED visit: dislocated L hip.   Medications: Outpatient Encounter Medications as of 10/19/2019  Medication Sig  . acetaminophen (TYLENOL) 500 MG tablet Take 2 tablets (1,000 mg total) by mouth every 8 (eight) hours as needed for mild pain.  Marland Kitchen acyclovir (ZOVIRAX) 400 MG tablet TAKE 1 TABLET BY MOUTH  TWICE DAILY  . albuterol (PROVENTIL) (2.5 MG/3ML) 0.083% nebulizer solution  Take 3 mLs (2.5 mg total) by nebulization 2 (two) times daily. And every 6 hours as needed.  DX: J44.9  . atenolol (TENORMIN) 25 MG tablet TAKE 1 TABLET BY MOUTH  DAILY  . atorvastatin (LIPITOR) 20 MG tablet TAKE 1 TABLET BY MOUTH  DAILY  . azithromycin (ZITHROMAX) 250 MG tablet Zpack taper as directed  . budesonide-formoterol (SYMBICORT) 160-4.5 MCG/ACT inhaler Inhale 2 puffs into the lungs 2 (two) times daily.  Marland Kitchen buPROPion (WELLBUTRIN XL) 300 MG 24 hr tablet Take 300 mg by mouth daily.  . cyclobenzaprine (FLEXERIL) 10 MG tablet TAKE 1 TABLET BY MOUTH AT  BEDTIME  . diphenhydrAMINE (BENADRYL) 25 MG tablet Take 25 mg by mouth 2 (two) times daily.   Marland Kitchen docusate sodium (COLACE) 100 MG capsule Take 1 capsule (100 mg total) by mouth 2 (two) times daily. (Patient taking differently: Take 100 mg by mouth at bedtime. )  . hydrochlorothiazide (HYDRODIURIL) 25 MG tablet TAKE 1 TABLET BY MOUTH  DAILY  . Ipratropium-Albuterol (COMBIVENT RESPIMAT) 20-100 MCG/ACT AERS respimat Inhale 1 puff into the lungs every 6 (six) hours as needed for wheezing. (Patient taking differently: Inhale 1 puff into the lungs in the morning and at bedtime. Take additional puff if needed at bedtime)  . levothyroxine (SYNTHROID) 25 MCG tablet TAKE 1 TABLET BY MOUTH  DAILY BEFORE BREAKFAST (Patient taking differently: Take 25 mcg by mouth daily. )  . Liniments (SALONPAS ARTHRITIS PAIN RELIEF EX) Apply 1 patch topically daily as needed (to painful sites- remove as directed).   . Melatonin 10 MG TABS Take 10 mg by mouth at bedtime.  . methylPREDNISolone (MEDROL DOSEPAK) 4 MG TBPK tablet As directed  . montelukast (SINGULAIR) 10 MG tablet TAKE 1  TABLET BY MOUTH AT  BEDTIME (Patient taking differently: Take 10 mg by mouth at bedtime. )  . Multiple Vitamins-Minerals (ADULT ONE DAILY GUMMIES) CHEW Chew 1 tablet by mouth daily.   . naproxen (NAPROSYN) 500 MG tablet Take 500 mg by mouth daily as needed for mild pain.   Marland Kitchen nystatin-triamcinolone  ointment (MYCOLOG) Apply 1 application topically 2 (two) times daily as needed (applied to skin folds/groins/under breast skin irritation rash.). (Patient taking differently: Apply 1 application topically 2 (two) times daily as needed (to skin folds/groin/under breast skin- for irritation/rashes). )  . OXYGEN Inhale 2-3 L/min into the lungs See admin instructions. Inhale  2-3 L/min of oxygen into the lungs w/CPAP at bedtime and as needed for shortness of breath with "strenuous activity" during the day  . pantoprazole (PROTONIX) 40 MG tablet TAKE 1 TABLET BY MOUTH  DAILY  . polyethylene glycol (MIRALAX / GLYCOLAX) 17 g packet Take 17 g by mouth daily as needed for mild constipation.  . pregabalin (LYRICA) 75 MG capsule Take 1 capsule (75 mg total) by mouth 2 (two) times daily. (Patient taking differently: Take 150 mg by mouth at bedtime. )  . QUEtiapine (SEROQUEL) 50 MG tablet TAKE 1 TABLET BY MOUTH AT  BEDTIME  . roflumilast (DALIRESP) 500 MCG TABS tablet Take 1 tablet (500 mcg total) by mouth daily.  . traMADol (ULTRAM) 50 MG tablet Take 1 tablet (50 mg total) by mouth every 6 (six) hours as needed.  . traZODone (DESYREL) 100 MG tablet TAKE 1 TABLET BY MOUTH AT  BEDTIME (Patient taking differently: Take 100 mg by mouth at bedtime. )  . venlafaxine XR (EFFEXOR-XR) 150 MG 24 hr capsule TAKE 1 CAPSULE BY MOUTH  DAILY WITH BREAKFAST (Patient taking differently: Take 150 mg by mouth daily. )   No facility-administered encounter medications on file as of 10/19/2019.    Current Diagnosis/Assessment:    Goals Addressed            This Visit's Progress   . Pharmacy Care Plan       CARE PLAN ENTRY  Current Barriers:  . Chronic Disease Management support, education, and care coordination needs related to Hypertension, Hyperlipidemia, Diabetes, and COPD   Hypertension BP Readings from Last 3 Encounters:  07/09/19 95/76  06/16/19 112/62  06/15/19 120/76 .  Pharmacist Clinical Goal(s): o Over  the next 90 days, patient will work with PharmD and providers to maintain BP goal <130/80 . Current regimen:  o atenolol 25 mg daily, o HCTZ 25 mg daily . Interventions: o Discussed BP goal and benefits of medications . Patient self care activities - Over the next 90 days, patient will: o Check BP if dizzy, document, and provide at future appointments o Ensure daily salt intake < 2300 mg/day  Hyperlipidemia Lab Results  Component Value Date/Time   LDLCALC 62 08/19/2017 08:22 AM   LDLDIRECT 56.0 12/17/2018 02:02 PM .  Pharmacist Clinical Goal(s): o Over the next 90 days, patient will work with PharmD and providers to maintain LDL goal < 70 . Current regimen:  o Atorvastatin 20 mg daily . Interventions: o Discussed cholesterol goals and benefits of atorvastatin for prevention of heart attack and stroke . Patient self care activities - Over the next 90 days, patient will: o Continue medication as prescribed o Continue low cholesterol diet  Diabetes . Pharmacist Clinical Goal(s): o Over the next 90 days, patient will work with PharmD and providers to maintain A1c goal <6.5% . Current regimen:  o  No medications . Interventions: o Discussed importance of diet and exercise to prevent diabetes progression . Patient self care activities - Over the next 90 days, patient will: o Continue heart healthy diet (see attached)  COPD . Pharmacist Clinical Goal(s) o Over the next 90 days, patient will work with PharmD and providers to optimize therapy and reduce costs . Current regimen:  o Symbicort 160/4.5 - 2 puff BID,  o Combivent Respimat 1 puff q6h prn,  o Daliresp 500 mcg daily,  o montelukast 10 mg HS,  o albuterol nebulized PRN . Interventions: o Combivent approved for patient assistance via [employer] C[employer] through 03/2020 . Patient self care activities - Over the next 90 days, patient will: o Follow up with pulmonology as scheduled  Medication management . Pharmacist Clinical  Goal(s): o Over the next 90 days, patient will work with PharmD and providers to achieve optimal medication adherence . Current pharmacy: [facility] mail order . Interventions o Comprehensive medication review performed. o Continue current medication management strategy . Patient self care activities - Over the next 90 days, patient will: o Focus on medication adherence by fill date o Take medications as prescribed o Utilize $40/quarter OTC allowance from [facility] o Report any questions or concerns to PharmD and/or provider(s)  Please see past updates related to this goal by clicking on the "Past Updates" button in the selected goal       COPD   Last spirometry score: FEV1 43% predicted, FEV1/FVC 0.47  Gold Grade: Gold 3 (FEV1 30-49%) Current COPD Classification:  D (high sx, >/=2 exacerbations/yr)  Tobacco Status:  Social History   Tobacco Use  Smoking Status Former Smoker  . Packs/day: 1.00  . Years: 44.00  . Pack years: 44.00  . Types: Cigarettes  . Quit date: 11/30/2018  . Years since quitting: 0.8  Smokeless Tobacco Never Used  Tobacco Comment   Smoker 1-1.5 PPD every day, 11-20-2018 "i got myself down to half a pack", i am gonna be going on chantix on the 28th"    Patient has failed these meds in past: Atrovent, Duoneb, Xoponex, Dulera, Spiriva, Incruse Patient is currently controlled on the following medications:   Symbicort 160/4.5 - 2 puff BID (AZ & Me)  Combivent 1 puff q6h prn (BI Cares)  roflumilast 500 mcg daily (AZ & Me)  montelukast 10 mg HS  albuterol nebs  Using maintenance inhaler regularly? Yes Frequency of rescue inhaler use:  daily  We discussed:  proper inhaler technique; pt reports more breathing difficulties in the AM, she is in the process of replacing her CPAP equipment due to a recall. She is also turning up her O2 to 3L overnight (normally 2 L during the day)  Plan  Continue current medications  Follow up with pulmonology   Hypertension    BP goal < 130/80  Office blood pressures are  BP Readings from Last 3 Encounters:  07/09/19 95/76  06/16/19 112/62  06/15/19 120/76   Patient has failed these meds in the past: metoprolol Patient is currently controlled on the following medications:   atenolol 25 mg daily,  HCTZ 25 mg daily  Patient checks BP at home infrequently  Patient home BP readings are ranging: n/a  We discussed diet and exercise extensively, BP goals, importance of compliance with meds and monitoring BP at home.  Plan  Continue current medications and control with diet and exercise    Hyperlipidemia   LDL goal < 100  Lipid Panel     Component  Value Date/Time   CHOL 125 12/17/2018 1402   CHOL 134 08/19/2017 0822   TRIG 261.0 (H) 12/17/2018 1402   HDL 37.80 (L) 12/17/2018 1402   HDL 43 08/19/2017 0822   CHOLHDL 3 12/17/2018 1402   VLDL 52.2 (H) 12/17/2018 1402   LDLCALC 62 08/19/2017 0822   LDLDIRECT 56.0 12/17/2018 1402   LABVLDL 29 08/19/2017 0822     The ASCVD Risk score (Goff DC Jr., et al., 2013) failed to calculate for the following reasons:   The valid total cholesterol range is 130 to 320 mg/dL   Patient has failed these meds in past: n/a Patient is currently controlled on the following medications:   atorvastatin 20 mg daily  We discussed:  Cholesterol goals, benefits of statins, diet changes to improve triglycerides.  Plan  Continue current medications and control with diet and exercise   Chronic pain   Back pain Cervicalgia L Hip pain  Patient has failed these meds in past: gabapentin Patient is currently controlled on the following medications:   Tylenol 1000 mg q8h prn,   naproxen 500 mg BID prn,   Pregabalin 75 mg BID  cyclobenzaprine 10 mg HS,   Salonpas patch  We discussed:  Pt reports pain is worse lately; she does still get relief from above regimen; recommended f/u with PCP if pain worsens  Plan  Continue current medications    Anxiety/depression   Depression screen [facility] 2/9 05/24/2019 12/18/2018 11/18/2018  Decreased Interest 2 3 1   Down, Depressed, Hopeless 0 0 0  PHQ - 2 Score 2 3 1   Altered sleeping 1 - -  Tired, decreased energy 1 - -  Change in appetite 3 - -  Feeling bad or failure about yourself  0 - -  Trouble concentrating 3 - -  Moving slowly or fidgety/restless 3 - -  Suicidal thoughts 0 - -  PHQ-9 Score 13 - -  Difficult doing work/chores Very difficult - -  Some recent data might be hidden    Patient has failed these meds in past: Abilify,  Patient is currently controlled on the following medications:   bupropion 300 mg  venlafaxine 150 mg qAM   quetiapine 50 mg HS  trazodone 100 mg HS  We discussed: PT reports worsening memory problems, she will stop in the middle of a task and forget what she was doing. She has discussed with her psychiatrist previously who increased quetiapine dose. Pt does not report any improvement since increasing dose. Encouraged her to follow up with psychiatrist regarding this issue.  Plan  Continue current medications  Follow up with psychiatrist regarding memory issues  Medication Management   Pt uses [facility] mail order and [facility] for all medications Uses pill box Pt endorses compliance  We discussed: Patient is satisfied with current medication strategy, denies going without needed medications. Encouraged her to take advantage of $40/quarter OTC allowance through [facility].  Patient assistance: Symbicort - AZ&Me Daliresp - AZ&Me Combivent - BI Cares (approved through 03/2020)  Plan  Continue current medication management strategy   Follow up: 6 month phone visit  Charlene Brooke, PharmD Clinical Pharmacist [facility] at [facility] [phone]

## 2019-10-19 NOTE — Patient Instructions (Addendum)
Visit Information  Phone number for Pharmacist: [phone]  Goals Addressed            This Visit's Progress   . Pharmacy Care Plan       CARE PLAN ENTRY  Current Barriers:  . Chronic Disease Management support, education, and care coordination needs related to Hypertension, Hyperlipidemia, Diabetes, and COPD   Hypertension BP Readings from Last 3 Encounters:  07/09/19 95/76  06/16/19 112/62  06/15/19 120/76 .  Pharmacist Clinical Goal(s): o Over the next 90 days, patient will work with PharmD and providers to maintain BP goal <130/80 . Current regimen:  o atenolol 25 mg daily, o HCTZ 25 mg daily . Interventions: o Discussed BP goal and benefits of medications . Patient self care activities - Over the next 90 days, patient will: o Check BP if dizzy, document, and provide at future appointments o Ensure daily salt intake < 2300 mg/day  Hyperlipidemia Lab Results  Component Value Date/Time   LDLCALC 62 08/19/2017 08:22 AM   LDLDIRECT 56.0 12/17/2018 02:02 PM .  Pharmacist Clinical Goal(s): o Over the next 90 days, patient will work with PharmD and providers to maintain LDL goal < 70 . Current regimen:  o Atorvastatin 20 mg daily . Interventions: o Discussed cholesterol goals and benefits of atorvastatin for prevention of heart attack and stroke . Patient self care activities - Over the next 90 days, patient will: o Continue medication as prescribed o Continue low cholesterol diet  Diabetes . Pharmacist Clinical Goal(s): o Over the next 90 days, patient will work with PharmD and providers to maintain A1c goal <6.5% . Current regimen:  o No medications . Interventions: o Discussed importance of diet and exercise to prevent diabetes progression . Patient self care activities - Over the next 90 days, patient will: o Continue heart healthy diet (see attached)  COPD . Pharmacist Clinical Goal(s) o Over the next 90 days, patient will work with PharmD and providers  to optimize therapy and reduce costs . Current regimen:  o Symbicort 160/4.5 - 2 puff BID,  o Combivent Respimat 1 puff q6h prn,  o Daliresp 500 mcg daily,  o montelukast 10 mg HS,  o albuterol nebulized PRN . Interventions: o Combivent approved for patient assistance via BI Cares through 03/2020 . Patient self care activities - Over the next 90 days, patient will: o Follow up with pulmonology as scheduled  Medication management . Pharmacist Clinical Goal(s): o Over the next 90 days, patient will work with PharmD and providers to achieve optimal medication adherence . Current pharmacy: [facility] mail order . Interventions o Comprehensive medication review performed. o Continue current medication management strategy . Patient self care activities - Over the next 90 days, patient will: o Focus on medication adherence by fill date o Take medications as prescribed o Utilize $40/quarter OTC allowance from [facility] o Report any questions or concerns to PharmD and/or provider(s)  Please see past updates related to this goal by clicking on the "Past Updates" button in the selected goal       Patient verbalizes understanding of instructions provided today.  The pharmacy team will reach out to the patient again over the next 90 days.   Charlene Brooke, PharmD, BCACP Clinical Pharmacist [facility] at [facility] [phone]  Health Maintenance, Female Adopting a healthy lifestyle and getting preventive care are important in promoting health and wellness. Ask your health care provider about:  The right schedule for you to have regular tests and exams.  Things you can do on your own to prevent diseases and keep yourself healthy. What should I know about diet, weight, and exercise? Eat a healthy diet   Eat a diet that includes plenty of vegetables, fruits, low-fat dairy products, and lean protein.  Do not eat a lot of foods that are high in solid fats, added sugars, or  sodium. Maintain a healthy weight Body mass index (BMI) is used to identify weight problems. It estimates body fat based on height and weight. Your health care provider can help determine your BMI and help you achieve or maintain a healthy weight. Get regular exercise Get regular exercise. This is one of the most important things you can do for your health. Most adults should:  Exercise for at least 150 minutes each week. The exercise should increase your heart rate and make you sweat (moderate-intensity exercise).  Do strengthening exercises at least twice a week. This is in addition to the moderate-intensity exercise.  Spend less time sitting. Even light physical activity can be beneficial. Watch cholesterol and blood lipids Have your blood tested for lipids and cholesterol at 58 years of age, then have this test every 5 years. Have your cholesterol levels checked more often if:  Your lipid or cholesterol levels are high.  You are older than 58 years of age.  You are at high risk for heart disease. What should I know about cancer screening? Depending on your health history and family history, you may need to have cancer screening at various ages. This may include screening for:  Breast cancer.  Cervical cancer.  Colorectal cancer.  Skin cancer.  Lung cancer. What should I know about heart disease, diabetes, and high blood pressure? Blood pressure and heart disease  High blood pressure causes heart disease and increases the risk of stroke. This is more likely to develop in people who have high blood pressure readings, are of African descent, or are overweight.  Have your blood pressure checked: ? Every 3-5 years if you are 40-62 years of age. ? Every year if you are 51 years old or older. Diabetes Have regular diabetes screenings. This checks your fasting blood sugar level. Have the screening done:  Once every three years after age 34 if you are at a normal weight and have  a low risk for diabetes.  More often and at a younger age if you are overweight or have a high risk for diabetes. What should I know about preventing infection? Hepatitis B If you have a higher risk for hepatitis B, you should be screened for this virus. Talk with your health care provider to find out if you are at risk for hepatitis B infection. Hepatitis C Testing is recommended for:  Everyone born from [birth year] through 1964.  Anyone with known risk factors for hepatitis C. Sexually transmitted infections (STIs)  Get screened for STIs, including gonorrhea and chlamydia, if: ? You are sexually active and are younger than 58 years of age. ? You are older than 58 years of age and your health care provider tells you that you are at risk for this type of infection. ? Your sexual activity has changed since you were last screened, and you are at increased risk for chlamydia or gonorrhea. Ask your health care provider if you are at risk.  Ask your health care provider about whether you are at high risk for HIV. Your health care provider may recommend a prescription medicine to help prevent HIV infection. If you choose  to take medicine to prevent HIV, you should first get tested for HIV. You should then be tested every 3 months for as long as you are taking the medicine. Pregnancy  If you are about to stop having your period (premenopausal) and you may become pregnant, seek counseling before you get pregnant.  Take 400 to 800 micrograms (mcg) of folic acid every day if you become pregnant.  Ask for birth control (contraception) if you want to prevent pregnancy. Osteoporosis and menopause Osteoporosis is a disease in which the bones lose minerals and strength with aging. This can result in bone fractures. If you are 26 years old or older, or if you are at risk for osteoporosis and fractures, ask your health care provider if you should:  Be screened for bone loss.  Take a calcium or vitamin D  supplement to lower your risk of fractures.  Be given hormone replacement therapy (HRT) to treat symptoms of menopause. Follow these instructions at home: Lifestyle  Do not use any products that contain nicotine or tobacco, such as cigarettes, e-cigarettes, and chewing tobacco. If you need help quitting, ask your health care provider.  Do not use street drugs.  Do not share needles.  Ask your health care provider for help if you need support or information about quitting drugs. Alcohol use  Do not drink alcohol if: ? Your health care provider tells you not to drink. ? You are pregnant, may be pregnant, or are planning to become pregnant.  If you drink alcohol: ? Limit how much you use to 0-1 drink a day. ? Limit intake if you are breastfeeding.  Be aware of how much alcohol is in your drink. In the [place] one drink equals one 12 oz bottle of beer (355 mL), one 5 oz glass of wine (148 mL), or one 1 oz glass of hard liquor (44 mL). General instructions  Schedule regular health, dental, and eye exams.  Stay current with your vaccines.  Tell your health care provider if: ? You often feel depressed. ? You have ever been abused or do not feel safe at home. Summary  Adopting a healthy lifestyle and getting preventive care are important in promoting health and wellness.  Follow your health care provider's instructions about healthy diet, exercising, and getting tested or screened for diseases.  Follow your health care provider's instructions on monitoring your cholesterol and blood pressure. This information is not intended to replace advice given to you by your health care provider. Make sure you discuss any questions you have with your health care provider. Document Revised: 02/04/2018 Document Reviewed: 02/04/2018 Elsevier Patient Education  2020 [employer].

## 2019-10-20 ENCOUNTER — Telehealth (HOSPITAL_COMMUNITY): Payer: Self-pay | Admitting: *Deleted

## 2019-10-20 ENCOUNTER — Other Ambulatory Visit: Payer: Self-pay | Admitting: Pulmonary Disease

## 2019-10-20 ENCOUNTER — Other Ambulatory Visit (HOSPITAL_COMMUNITY): Payer: Self-pay | Admitting: Internal Medicine

## 2019-10-20 NOTE — Telephone Encounter (Signed)
This encounter was created in error - please disregard.

## 2019-10-20 NOTE — Addendum Note (Signed)
Addended by: Deon Pilling on: 10/20/2019 03:09 PM   Modules accepted: Level of Service, SmartSet

## 2019-10-21 ENCOUNTER — Other Ambulatory Visit: Payer: Self-pay

## 2019-10-21 ENCOUNTER — Ambulatory Visit (INDEPENDENT_AMBULATORY_CARE_PROVIDER_SITE_OTHER): Payer: Medicare Other | Admitting: Allergy & Immunology

## 2019-10-21 ENCOUNTER — Encounter: Payer: Self-pay | Admitting: Allergy & Immunology

## 2019-10-21 VITALS — BP 132/82 | HR 70 | Temp 97.9°F | Resp 16 | Ht 67.0 in | Wt 207.4 lb

## 2019-10-21 DIAGNOSIS — J310 Chronic rhinitis: Secondary | ICD-10-CM | POA: Diagnosis not present

## 2019-10-21 DIAGNOSIS — K219 Gastro-esophageal reflux disease without esophagitis: Secondary | ICD-10-CM | POA: Diagnosis not present

## 2019-10-21 DIAGNOSIS — J449 Chronic obstructive pulmonary disease, unspecified: Secondary | ICD-10-CM

## 2019-10-21 DIAGNOSIS — J3089 Other allergic rhinitis: Secondary | ICD-10-CM | POA: Diagnosis not present

## 2019-10-21 NOTE — Progress Notes (Addendum)
NEW PATIENT  Date of Service/Encounter:  10/21/19  Referring provider: [name], MD   Assessment:   Chronic nonallergic rhinitis   Gastroesophageal reflux disease  COPD  Plan/Recommendations:   Allergic rhinitis We will collect some labs today to help determine your environmental allergies.  We will call you when the results of these labs become available Continue montelukast 10 mg once a day Begin Flonase 2 sprays in each nostril once a day as needed for stuffy nose.  In the right nostril, point the applicator out toward the right ear. In the left nostril, point the applicator out toward the left ear Begin cetirizine 10 mg once a day as needed for runny nose or itch Begin azelastine nasal spray 1-2 sprays in each nostril twice a day as needed for a runny nose or itchy nose Consider saline nasal rinses as needed for nasal symptoms. Use this before any medicated nasal sprays for best result  Allergic conjunctivitis Some over the counter eye drops include Pataday (olopatadine) one drop in each eye once a day as needed for red, itchy eyes OR Zaditor one drop in each eye twice a day as needed for red itchy eyes.  Reflux Continue Protonix 40 mg once a day to decrease reflux Continue dietary lifestyle modifications as listed below  COPD Continue montelukast 10 mg once a day to prevent cough or wheeze Continue Symbicort 2 puffs twice a day with a spacer to prevent cough or wheeze Continue Combivent 1 puff twice a day as directed by Dr. Drue Stager Continue to follow-up as suggested by Dr. Drue Stager  Call the clinic if this treatment plan is not working well for you  Follow up in 2 months or sooner if needed.   Subjective:   Joan [name] is a 59 y.o. female presenting today for evaluation of  Chief Complaint  Patient presents with  . Allergic Rhinitis     Eyes feel dry, dry nose. Itching on body in different areas at diferent times    Joan [name] has a  history of the following: Patient Active Problem List   Diagnosis Date Noted  . Chronic rhinitis 10/21/2019  . Head pain 06/16/2019  . Allergic rhinitis 06/16/2019  . Acute appendicitis 03/30/2019  . COPD (chronic obstructive pulmonary disease) ([place]) 03/30/2019  . Obese 01/27/2019  . S/P left TH revision 01/26/2019  . Cervicalgia 12/18/2018  . Chest pain of uncertain etiology 60/63/0160  . Hypertension   . Medication management 11/06/2018  . Tobacco abuse 01/15/2018  . Hypomagnesemia 12/29/2017  . Insomnia 12/11/2017  . Pulmonary nodule 11/11/2017  . Carotid artery disease ([place]) 11/05/2017  . Hoarseness 09/09/2017  . OSA on CPAP 08/07/2017  . Family history of heart disease 07/18/2017  . Gastroesophageal reflux disease 06/23/2017  . H/O total hip arthroplasty, left 06/18/2017  . Hypothyroidism due to acquired atrophy of thyroid 04/05/2017  . Primary osteoarthritis involving multiple joints 04/05/2017  . Incarcerated ventral hernia 04/04/2017  . Chronic constipation 02/10/2017  . Iron deficiency anemia due to dietary causes 02/10/2017  . Osteoarthritis 02/10/2017  . Dyslipidemia 02/10/2017  . H/O total hip arthroplasty, right 02/05/2017  . Rash 01/08/2017  . Pre-operative clearance 12/16/2016  . Chronic respiratory failure with hypoxia ([place]) 12/16/2016  . Essential hypertension 11/29/2016  . Back pain 11/29/2016  . Vocal cord dysfunction   . COPD, severe ([place]) 09/24/2016  . Anxiety 09/24/2016  . Steroid-induced diabetes ([place]) 09/24/2016    History obtained from: chart review and patient.  Joan [name] was referred by [name], MD.     Joan [name] is a 59 y.o. female presenting for an evaluation of allergies.   Asthma/Respiratory Symptom History: Teodora reports Joan [name] has smoked cigarettes from age 71 through age 44 and has recently stopped smoking in October 2020. Joan [name] has a history of COPD for which Joan [name] follows Dr. Elsworth Soho, pulmonology specialist. Joan [name] is currently  using a LBAB, ICS, and LAMA.   Allergic Rhinitis Symptom History: Joan [name] reports that Joan [name] has ewxperiences an "itchy face" continuouslu for the last 5-6 years. Joan [name] denies seasonal variation. Joan [name] reports occasional clear rhinorrhes, nasal congestion especially with CPAP at night, sneezing, and thick post nasal drainage. Joan [name] is currently using Benadryl as needed with moderate relief of symptoms. Joan [name] is not using nasal saline rinses or nasal steroid sprays at this time.   Food Allergy Symptom History: Joan [name] denies history of food allergy. Interestingly, Joan [name] reports occasionally vomiting and trouble swolling with eating. Joan [name] reports Joan [name] uses fluids to "push the food down". Joan [name] continues protonix 40 mg once a day   Eczema Symptom History: Joan [name] denies hisotry of eczema, hives, or angioedema.  Otherwise, there is no history of other atopic diseases. There is no significant infectious history.  Joan [name] presents to the clinic today for evaluation of allergic rhinitis, allergic conjunctivitis, and facial pruritus.  Joan [name] reports this started about 5 or 6 years ago.  Joan [name] moved from the [place] area to [place] about 4 years ago, however, Joan [name] reported the symptoms began occurring before moving to [place].  Joan [name] reports these symptoms are year-round and include itchy watery eyes and nasal symptoms such as clear rhinorrhea, nasal congestion, and sneezing that occur year-round with no seasonal variation.  Joan [name] reports that Joan [name] had an allergy test done about 30 years ago that indicated " seasonal allergies".  Joan [name] denies history of hives or facial swelling.  Joan [name] does report that while eating food Joan [name] sometimes feels her voice quality change to raspy, has trouble swallowing her food and feels like Joan [name] needs to push it down with fluids, and occasionally vomits while eating.  Joan [name] is currently taking Protonix.  Joan [name] continues to follow-up with Dr. Elsworth Soho, pulmonary specialist, for COPD for which Joan [name] uses 3 L of oxygen through her  CPAP and occasional nasal cannula during the daytime.  Joan [name] does report shortness of breath with activity wheeze, with activity, and cough producing phlegm occurring mostly in the morning.  Joan [name] continues Symbicort 160-2 puffs twice a day, Combivent 1 puff twice a day, and albuterol several times a day.  Her current medications are listed in the chart.  Past Medical History: Patient Active Problem List   Diagnosis Date Noted  . Chronic rhinitis 10/21/2019  . Head pain 06/16/2019  . Allergic rhinitis 06/16/2019  . Acute appendicitis 03/30/2019  . COPD (chronic obstructive pulmonary disease) ([place]) 03/30/2019  . Obese 01/27/2019  . S/P left TH revision 01/26/2019  . Cervicalgia 12/18/2018  . Chest pain of uncertain etiology 23/53/6144  . Hypertension   . Medication management 11/06/2018  . Tobacco abuse 01/15/2018  . Hypomagnesemia 12/29/2017  . Insomnia 12/11/2017  . Pulmonary nodule 11/11/2017  . Carotid artery disease ([place]) 11/05/2017  . Hoarseness 09/09/2017  . OSA on CPAP 08/07/2017  . Family history of heart disease 07/18/2017  . Gastroesophageal reflux disease 06/23/2017  . H/O total hip arthroplasty, left 06/18/2017  . Hypothyroidism due to acquired atrophy of thyroid 04/05/2017  . Primary osteoarthritis involving multiple joints 04/05/2017  .  Incarcerated ventral hernia 04/04/2017  . Chronic constipation 02/10/2017  . Iron deficiency anemia due to dietary causes 02/10/2017  . Osteoarthritis 02/10/2017  . Dyslipidemia 02/10/2017  . H/O total hip arthroplasty, right 02/05/2017  . Rash 01/08/2017  . Pre-operative clearance 12/16/2016  . Chronic respiratory failure with hypoxia ([place]) 12/16/2016  . Essential hypertension 11/29/2016  . Back pain 11/29/2016  . Vocal cord dysfunction   . COPD, severe ([place]) 09/24/2016  . Anxiety 09/24/2016  . Steroid-induced diabetes ([place]) 09/24/2016    Medication List:  Allergies as of 10/21/2019      Reactions   Keflex [cephalexin] Rash,  Other (See Comments)   Has tolerated amoxicillin since had keflex   Prednisone Other (See Comments)   "counter reacts"   Shellfish Allergy Shortness Of Breath, Nausea And Vomiting, Other (See Comments)   Stomach cramps   Tetracyclines & Related Anaphylaxis, Swelling, Other (See Comments)   Throat swelling requiring hospitalization   Dilaudid [hydromorphone Hcl] Other (See Comments)   "Lethargy"   Incruse Ellipta [umeclidinium Bromide] Nausea Only   Tuberculin Tests Other (See Comments)   False postive      Medication List       Accurate as of October 21, 2019  5:24 PM. If you have any questions, ask your nurse or doctor.        acetaminophen 500 MG tablet Commonly known as: TYLENOL Take 2 tablets (1,000 mg total) by mouth every 8 (eight) hours as needed for mild pain.   acyclovir 400 MG tablet Commonly known as: ZOVIRAX TAKE 1 TABLET BY MOUTH  TWICE DAILY   Adult One Daily Gummies Chew Chew 1 tablet by mouth daily.   albuterol (2.5 MG/3ML) 0.083% nebulizer solution Commonly known as: PROVENTIL Take 3 mLs (2.5 mg total) by nebulization 2 (two) times daily. And every 6 hours as needed.  DX: J44.9   atenolol 25 MG tablet Commonly known as: TENORMIN TAKE 1 TABLET BY MOUTH  DAILY   atorvastatin 20 MG tablet Commonly known as: LIPITOR TAKE 1 TABLET BY MOUTH  DAILY   azithromycin 250 MG tablet Commonly known as: ZITHROMAX Zpack taper as directed   budesonide-formoterol 160-4.5 MCG/ACT inhaler Commonly known as: Symbicort Inhale 2 puffs into the lungs 2 (two) times daily.   buPROPion 300 MG 24 hr tablet Commonly known as: WELLBUTRIN XL Take 300 mg by mouth daily.   cyclobenzaprine 10 MG tablet Commonly known as: FLEXERIL TAKE 1 TABLET BY MOUTH AT  BEDTIME   Daliresp 500 MCG Tabs tablet Generic drug: roflumilast Take 1 tablet (500 mcg total) by mouth daily.   diphenhydrAMINE 25 MG tablet Commonly known as: BENADRYL Take 25 mg by mouth 2 (two) times daily.    docusate sodium 100 MG capsule Commonly known as: Colace Take 1 capsule (100 mg total) by mouth 2 (two) times daily. What changed: when to take this   hydrochlorothiazide 25 MG tablet Commonly known as: HYDRODIURIL TAKE 1 TABLET BY MOUTH  DAILY   Ipratropium-Albuterol 20-100 MCG/ACT Aers respimat Commonly known as: Combivent Respimat Inhale 1 puff into the lungs every 6 (six) hours as needed for wheezing. What changed:   when to take this  additional instructions   levothyroxine 25 MCG tablet Commonly known as: SYNTHROID TAKE 1 TABLET BY MOUTH  DAILY BEFORE BREAKFAST   Melatonin 10 MG Tabs Take 10 mg by mouth at bedtime.   methylPREDNISolone 4 MG Tbpk tablet Commonly known as: MEDROL DOSEPAK As directed   montelukast 10 MG tablet Commonly known  as: SINGULAIR TAKE 1 TABLET BY MOUTH AT  BEDTIME   naproxen 500 MG tablet Commonly known as: NAPROSYN Take 500 mg by mouth daily as needed for mild pain.   nystatin-triamcinolone ointment Commonly known as: MYCOLOG Apply 1 application topically 2 (two) times daily as needed (applied to skin folds/groins/under breast skin irritation rash.). What changed: reasons to take this   OXYGEN Inhale 2-3 L/min into the lungs See admin instructions. Inhale  2-3 L/min of oxygen into the lungs w/CPAP at bedtime and as needed for shortness of breath with "strenuous activity" during the day   pantoprazole 40 MG tablet Commonly known as: PROTONIX TAKE 1 TABLET BY MOUTH  DAILY   Pataday 0.2 % Soln Generic drug: Olopatadine HCl Apply 1 drop to eye daily.   polyethylene glycol 17 g packet Commonly known as: MIRALAX / GLYCOLAX Take 17 g by mouth daily as needed for mild constipation.   pregabalin 75 MG capsule Commonly known as: Lyrica Take 1 capsule (75 mg total) by mouth 2 (two) times daily. What changed:   how much to take  when to take this   QUEtiapine 50 MG tablet Commonly known as: SEROQUEL TAKE 1 TABLET BY MOUTH AT   BEDTIME   SALONPAS ARTHRITIS PAIN RELIEF EX Apply 1 patch topically daily as needed (to painful sites- remove as directed).   traMADol 50 MG tablet Commonly known as: Ultram Take 1 tablet (50 mg total) by mouth every 6 (six) hours as needed.   traZODone 100 MG tablet Commonly known as: DESYREL TAKE 1 TABLET BY MOUTH AT  BEDTIME   venlafaxine XR 150 MG 24 hr capsule Commonly known as: EFFEXOR-XR TAKE 1 CAPSULE BY MOUTH  DAILY WITH BREAKFAST What changed:   how much to take  how to take this  when to take this  additional instructions       Past Surgical History: Past Surgical History:  Procedure Laterality Date  . BACK SURGERY    . BIOPSY  11/24/2018   Procedure: BIOPSY;  Surgeon: Thornton Park, MD;  Location: [facility];  Service: Gastroenterology;;  . CARDIAC CATHETERIZATION    . COLONOSCOPY    . COLONOSCOPY WITH PROPOFOL N/A 11/24/2018   Procedure: COLONOSCOPY WITH PROPOFOL;  Surgeon: Thornton Park, MD;  Location: [facility];  Service: Gastroenterology;  Laterality: N/A;  . CYST EXCISION     removal of cyst in sinuses  . DILATION AND CURETTAGE OF UTERUS    . ESOPHAGOGASTRODUODENOSCOPY (EGD) WITH PROPOFOL N/A 11/24/2018   Procedure: ESOPHAGOGASTRODUODENOSCOPY (EGD) WITH PROPOFOL;  Surgeon: Thornton Park, MD;  [facility]: [facility];  Service: Gastroenterology;  Laterality: N/A;  . FOOT SURGERY Bilateral    bone removed from 4th and 5 th toes and put in pin then took pin out  . HERNIA REPAIR    . INCISION AND DRAINAGE  ~ 2014/2015   "removed mesh & infection"  . INCISIONAL HERNIA REPAIR N/A 04/04/2017   Procedure: LAPAROSCOPIC INCISIONAL HERNIA REPAIR WITH MESH;  Surgeon: Fanny Skates, MD;  Location: [facility];  Service: General;  Laterality: N/A;  . LACERATION REPAIR Right    repair wrist artery from laceration from ice skate  . LAPAROSCOPIC APPENDECTOMY N/A 03/31/2019   Procedure: APPENDECTOMY LAPAROSCOPIC;  Surgeon: Ralene Ok, MD;  Location: [facility];  Service: General;  Laterality: N/A;  . LAPAROSCOPIC APPENDECTOMY N/A 07/09/2019   Procedure: APPENDECTOMY LAPAROSCOPIC;  Surgeon: Ralene Ok, MD;  Location: [facility];  Service: General;  Laterality: N/A;  . LAPAROSCOPIC INCISIONAL / UMBILICAL /  VENTRAL HERNIA REPAIR  04/04/2017   LAPAROSCOPIC INCISIONAL HERNIA REPAIR WITH MESH  . LIPOMA EXCISION Right    fattty lipoma in neck  . NASAL SEPTUM SURGERY    . POSTERIOR LUMBAR FUSION  2015/2016 X 2   "added rods and screws"  . SINOSCOPY    . TOTAL HIP ARTHROPLASTY Right 02/05/2017   Procedure: RIGHT TOTAL HIP ARTHROPLASTY;  Surgeon: [name], MD;  [facility];  Service: Orthopedics;  Laterality: Right;  . TOTAL HIP ARTHROPLASTY Left 06/18/2017   Procedure: LEFT TOTAL HIP ARTHROPLASTY;  Surgeon: [name], MD;  Location[facility];  Service: Orthopedics;  Laterality: Left;  . TOTAL HIP REVISION Left 01/26/2019   Procedure: TOTAL HIP REVISION;  Surgeon: Paralee Cancel, MD;  Location: [facility];  Service: Orthopedics;  Laterality: Left;  2 hrs  . TUBAL LIGATION    . UMBILICAL HERNIA REPAIR  ~ 2013   w/mesh  . UPPER GI ENDOSCOPY       Family History: Family History  Problem Relation Age of Onset  . Diabetes Mother   . Hypertension Mother   . Hypertension Father   . Cancer Father        lung     Social History: Joan [name] lives in a 58 year old rental home with no concern for water damage or mildew.  The flooring is carpeting throughout.  Heating is electric and cooling is central.  There are dogs and cats located inside the home, outside the home, and inside the bedroom.  There is no concern for roaches in the home and the bed is at least 2 feet off the floor.  There are no plastic or dust mite free covers in use on the bed or pillow.  Joan [name] is not exposed to cigarette smoke, fumes, chemicals, or dust in the home or work.  Joan [name] smoked 2 packs of cigarettes a day from age 39 to age 42  and has recently quit in October  2020.   Review of Systems  Constitutional: Negative.   HENT: Positive for congestion.        Clear rhinorrhea, nasal congestion, postnasal drainage, and sneezing on occasion  Eyes:       Eye itching and redness.  No discharge  Respiratory: Positive for cough, sputum production, shortness of breath and wheezing.   Cardiovascular: Negative.   Gastrointestinal: Positive for heartburn and vomiting.  Genitourinary: Negative.   Skin: Positive for itching.       Facial pruritus  Endo/Heme/Allergies: Positive for environmental allergies.      Objective:   Blood pressure 132/82, pulse 70, temperature 97.9 F (36.6 C), resp. rate 16, height 5\' 7"  (1.702 m), weight 207 lb 6.4 oz (94.1 kg), SpO2 97 %. Body mass index is 32.48 kg/m.   Physical Exam:  Physical Exam Vitals reviewed.  Constitutional:      Appearance: Normal appearance.  HENT:     Head: Normocephalic and atraumatic.     Right Ear: Tympanic membrane normal.     Left Ear: Tympanic membrane normal.     Nose:     Comments: Bilateral nares edematous and pale with clear nasal drainage.  Pharynx normal.  Ears normal.  Eyes normal.    Mouth/Throat:     Pharynx: Oropharynx is clear.  Eyes:     Conjunctiva/sclera: Conjunctivae normal.  Cardiovascular:     Rate and Rhythm: Normal rate and regular rhythm.     Heart sounds: Normal heart sounds. No murmur heard.   Pulmonary:  Effort: Pulmonary effort is normal.     Breath sounds: Normal breath sounds.     Comments: Lungs clear to auscultation Musculoskeletal:        General: Normal range of motion.     Cervical back: Normal range of motion and neck supple.  Skin:    General: Skin is warm and dry.  Neurological:     Mental Status: Joan [name] is alert and oriented to person, place, and time.  Psychiatric:        Mood and Affect: Mood normal.        Behavior: Behavior normal.        Thought Content: Thought content normal.        Judgment: Judgment normal.       Spirometry: results abnormal (FEV1: 1.04%, FVC: 2.51%, FEV1/FVC: 0.41%).    Allergy Studies: Environmental allergy panel ordered  Thank you for the opportunity to care for this patient.  Please do not hesitate to contact me with questions.  Gareth Morgan, FNP [facility]     I performed a history and physical examination of the patient and discussed her management with the Nurse Practitioner. I reviewed the Nurse Practitioner's note and agree with the documented findings and plan of care. The note in its entirety was edited by myself, including the physical exam, assessment, and plan.     Salvatore Marvel, MD [facility]

## 2019-10-21 NOTE — Patient Instructions (Addendum)
Allergic rhinitis We will collect some labs today to help determine your environmental allergies.  We will call you when the results of these labs become available Continue montelukast 10 mg once a day Begin Flonase 2 sprays in each nostril once a day as needed for stuffy nose.  In the right nostril, point the applicator out toward the right ear. In the left nostril, point the applicator out toward the left ear Begin cetirizine 10 mg once a day as needed for runny nose or itch Begin azelastine nasal spray 1-2 sprays in each nostril twice a day as needed for a runny nose or itchy nose Consider saline nasal rinses as needed for nasal symptoms. Use this before any medicated nasal sprays for best result  Allergic conjunctivitis Some over the counter eye drops include Pataday (olopatadine) one drop in each eye once a day as needed for red, itchy eyes OR Zaditor one drop in each eye twice a day as needed for red itchy eyes.  Reflux Continue Protonix 40 mg once a day to decrease reflux Continue dietary lifestyle modifications as listed below  COPD Continue montelukast 10 mg once a day to prevent cough or wheeze Continue Symbicort 2 puffs twice a day with a spacer to prevent cough or wheeze Continue Combivent 1 puff twice a day as directed by Dr. Drue Stager Continue to follow-up as suggested by Dr. Drue Stager  Call the clinic if this treatment plan is not working well for you  Follow up in 2 months or sooner if needed.   Lifestyle Changes for Controlling GERD When you have GERD, stomach acid feels as if it's backing up toward your mouth. Whether or not you take medication to control your GERD, your symptoms can often be improved with lifestyle changes.   Raise Your Head  Reflux is more likely to strike when you're lying down flat, because stomach fluid can  flow backward more easily. Raising the head of your bed 4-6 inches can help. To do this:  Slide blocks or books under the legs at the head of  your bed. Or, place a wedge under  the mattress. Many foam stores can make a suitable wedge for you. The wedge  should run from your waist to the top of your head.  Don't just prop your head on several pillows. This increases pressure on your  stomach. It can make GERD worse.  Watch Your Eating Habits Certain foods may increase the acid in your stomach or relax the lower esophageal sphincter, making GERD more likely. It's best to avoid the following:  Coffee, tea, and carbonated drinks (with and without caffeine)  Fatty, fried, or spicy food  Mint, chocolate, onions, and tomatoes  Any other foods that seem to irritate your stomach or cause you pain  Relieve the Pressure  Eat smaller meals, even if you have to eat more often.  Don't lie down right after you eat. Wait a few hours for your stomach to empty.  Avoid tight belts and tight-fitting clothes.  Lose excess weight.  Tobacco and Alcohol  Avoid smoking tobacco and drinking alcohol. They can make GERD symptoms worse.

## 2019-10-22 ENCOUNTER — Encounter: Payer: Self-pay | Admitting: Allergy & Immunology

## 2019-10-22 ENCOUNTER — Other Ambulatory Visit (HOSPITAL_COMMUNITY): Payer: Self-pay | Admitting: Psychiatry

## 2019-10-22 ENCOUNTER — Telehealth (HOSPITAL_COMMUNITY): Payer: Self-pay

## 2019-10-22 NOTE — Telephone Encounter (Signed)
Patient called trying to reach [facility] EP and Environmental consultant for pulmonary rehab. They were not in at the moment. We advised pt that every time they call she does not answer. Also advised pt when they are here working and she always calls when they are not here mostly. Pt stated that she returns their phone call when she thinks about it. Will send Kirk Ruths and [name] a message to call pt back.

## 2019-10-23 DIAGNOSIS — J441 Chronic obstructive pulmonary disease with (acute) exacerbation: Secondary | ICD-10-CM | POA: Diagnosis not present

## 2019-10-23 DIAGNOSIS — J449 Chronic obstructive pulmonary disease, unspecified: Secondary | ICD-10-CM | POA: Diagnosis not present

## 2019-10-23 DIAGNOSIS — J9611 Chronic respiratory failure with hypoxia: Secondary | ICD-10-CM | POA: Diagnosis not present

## 2019-10-23 DIAGNOSIS — G4733 Obstructive sleep apnea (adult) (pediatric): Secondary | ICD-10-CM | POA: Diagnosis not present

## 2019-10-25 LAB — ALLERGENS W/COMP RFLX AREA 2
Alternaria Alternata IgE: <0.1 kU/L
Aspergillus Fumigatus IgE: <0.1 kU/L
Bermuda Grass IgE: <0.1 kU/L
Cedar, Mountain IgE: <0.1 kU/L
Cladosporium Herbarum IgE: <0.1 kU/L
Cockroach, German IgE: <0.1 kU/L
Common Silver Birch IgE: <0.1 kU/L
Cottonwood IgE: <0.1 kU/L
D Farinae IgE: <0.1 kU/L
D Pteronyssinus IgE: <0.1 kU/L
E001-IgE Cat Dander: <0.1 kU/L
E005-IgE Dog Dander: <0.1 kU/L
Elm, American IgE: <0.1 kU/L
IgE (Immunoglobulin E), Serum: 10 IU/mL (ref 6–495)
Johnson Grass IgE: <0.1 kU/L
Maple/Box Elder IgE: <0.1 kU/L
Mouse Urine IgE: <0.1 kU/L
Oak, White IgE: <0.1 kU/L
Pecan, Hickory IgE: <0.1 kU/L
Penicillium Chrysogen IgE: <0.1 kU/L
Pigweed, Rough IgE: <0.1 kU/L
Ragweed, Short IgE: <0.1 kU/L
Sheep Sorrel IgE Qn: <0.1 kU/L
Timothy Grass IgE: <0.1 kU/L
White Mulberry IgE: <0.1 kU/L

## 2019-10-26 ENCOUNTER — Telehealth (HOSPITAL_COMMUNITY): Payer: Self-pay | Admitting: Internal Medicine

## 2019-10-26 ENCOUNTER — Ambulatory Visit (INDEPENDENT_AMBULATORY_CARE_PROVIDER_SITE_OTHER): Payer: Medicare Other | Admitting: Licensed Clinical Social Worker

## 2019-10-26 DIAGNOSIS — F0630 Mood disorder due to known physiological condition, unspecified: Secondary | ICD-10-CM | POA: Diagnosis not present

## 2019-10-26 DIAGNOSIS — F411 Generalized anxiety disorder: Secondary | ICD-10-CM | POA: Diagnosis not present

## 2019-10-26 DIAGNOSIS — F4312 Post-traumatic stress disorder, chronic: Secondary | ICD-10-CM | POA: Diagnosis not present

## 2019-10-26 DIAGNOSIS — R443 Hallucinations, unspecified: Secondary | ICD-10-CM

## 2019-10-26 NOTE — Progress Notes (Signed)
Reviewed chart and insurance data for medication adherence. Patient does not have any gaps in adherence and is not in danger of failing any Medicare adherence measures. No further action required. ° °

## 2019-10-26 NOTE — Progress Notes (Signed)
Virtual Visit via Telephone Note  Therapist-home office Patient-home I connected with Joan [name] on 10/26/19 at  2:00 PM EDT by telephone and verified that I am speaking with the correct person using two identifiers.   I discussed the limitations, risks, security and privacy concerns of performing an evaluation and management service by telephone and the availability of in person appointments. I also discussed with the patient that there may be a patient responsible charge related to this service. The patient expressed understanding and agreed to proceed.   I discussed the assessment and treatment plan with the patient. The patient was provided an opportunity to ask questions and all were answered. The patient agreed with the plan and demonstrated an understanding of the instructions.   The patient was advised to call back or seek an in-person evaluation if the symptoms worsen or if the condition fails to improve as anticipated.  I provided 54 minutes of non-face-to-face time during this encounter.   THERAPIST PROGRESS NOTE  Session Time: 2:00 PM to 2:54 PM  Participation Level: Active  Behavioral Response: CasualAlertAnxious and Euthymic  Type of Therapy: Individual Therapy  Treatment Goals addressed: PTSD, coping,  Interventions: Solution Focused, Biofeedback, Supportive and Other: grief  Summary: Joan [name] is a 59 y.o. female who presents with discussing keep learning and keep trying with new electronic device. Talking to doctor wants [place] to work on PTSD, grief. Patient forgetting basic things. Head and neck hurt. Lot of pain in neck. Neck messed up for a long time. Supposed to have surgery awhile ago hope that they can still fix it. Had a scan and told her there was not a sign of anything. Gets headaches "it is horrible" sometimes can't move neck a certain way and can't go anywhere and painful. They did scan of neck and  nobody called to tell her what the issue be.  Should explain and tell her what they found. Want to see a nutritionist want to start dieting already started. Brother not trying to be mean will say something but still bothers her by the way he says it. Switched one addiction to another. Can't stop eating ice cream. Quit crack and pot and went on ice cream kick. Started seeing things when husband, [name], died and paranoia got really bad on crack. He died in 06/07/10. He died of heart attack. He had "sectioned" her again. (Commit her.) Didn't have any room ended up in penitentiary and was very angry. He told judge that she had threatened him and friend. Patient asked him why lying. During session patient shares she just forget what talking about finds herself doing that more and concerns her. She was sectioned and  2nd time was in jail first time actually in a program. Actually thought was helpful. She went to jail on November the 1st 7 days later he passed away. They kids buried him in ground with his ashes surprised they did not what they wanted. Patient has his ashes and will add her when she passes. Had told kids including his kids of plan also to include  [employer] in urn, their puppy. Want them spread on the beach. He was using drugs doctors have him on 30 mg Percocet. She found him crushing drugs. Knows he was using drugs. Relates because she kept using crack he continued to be deragoratory toward her with usage. His drugs of choice were cocaine and Percocet probably impacted his heart, knows that it was the drugs that cause it. When  found out devastating screamed couldn't believe it. They had a beautiful relationship. When together she didn't smoke crack, quit a lot of things and only smoked pot and he nagged her about that. He was a pill popper. Went back crushing them, because using cocaine. Together 15 years. married for 5 years. Did go back to using crack got a hold of her and couldn't stop. The shock of losing him and the crack cocaine high all the time  morning until night. Really bad coming off that. Why ended on Effexor.        Suicidal/Homicidal: No  Therapist Response: Therapist reviewed symptoms, facilitated expression of thoughts and feelings to help her work through frustrations for current stressors.  Do not grief issues with patient helping her process her feelings relating to why grief was so difficult for her more about her relationship with husband and why that was difficult to lose him.  Therapist related 1 thing can happen from grief is hallucinations so that working on it may help and resolving this but also natural part of the process of grief, culturally accepted in some places.  Possible continue work on grief and PTSD to help address hallucinations.  Therapist provided active listening open questions supportive interventions  Plan: Return again in 2 weeks.2.  Therapist work with patient on grief continuing for her to process feelings, PTSD and coping  Diagnosis: Axis I:   PTSD, hallucinations, GAD, Mood Disorder in conditions classified elsewhere    Axis II: No diagnosis    [name], LCSW 10/26/2019

## 2019-10-28 ENCOUNTER — Encounter: Payer: Self-pay | Admitting: Internal Medicine

## 2019-10-28 ENCOUNTER — Ambulatory Visit (INDEPENDENT_AMBULATORY_CARE_PROVIDER_SITE_OTHER): Payer: Medicare Other | Admitting: Internal Medicine

## 2019-10-28 ENCOUNTER — Other Ambulatory Visit: Payer: Self-pay

## 2019-10-28 VITALS — BP 116/72 | HR 66 | Temp 97.9°F | Ht 67.0 in

## 2019-10-28 DIAGNOSIS — G252 Other specified forms of tremor: Secondary | ICD-10-CM | POA: Insufficient documentation

## 2019-10-28 DIAGNOSIS — E118 Type 2 diabetes mellitus with unspecified complications: Secondary | ICD-10-CM

## 2019-10-28 DIAGNOSIS — D508 Other iron deficiency anemias: Secondary | ICD-10-CM | POA: Diagnosis not present

## 2019-10-28 DIAGNOSIS — I10 Essential (primary) hypertension: Secondary | ICD-10-CM

## 2019-10-28 DIAGNOSIS — R7303 Prediabetes: Secondary | ICD-10-CM | POA: Diagnosis not present

## 2019-10-28 DIAGNOSIS — E034 Atrophy of thyroid (acquired): Secondary | ICD-10-CM | POA: Diagnosis not present

## 2019-10-28 DIAGNOSIS — M47812 Spondylosis without myelopathy or radiculopathy, cervical region: Secondary | ICD-10-CM | POA: Diagnosis not present

## 2019-10-28 DIAGNOSIS — Z23 Encounter for immunization: Secondary | ICD-10-CM

## 2019-10-28 NOTE — Patient Instructions (Signed)
Hypothyroidism  Hypothyroidism is when the thyroid gland does not make enough of certain hormones (it is underactive). The thyroid gland is a small gland located in the lower front part of the neck, just in front of the windpipe (trachea). This gland makes hormones that help control how the body uses food for energy (metabolism) as well as how the heart and brain function. These hormones also play a role in keeping your bones strong. When the thyroid is underactive, it produces too little of the hormones thyroxine (T4) and triiodothyronine (T3). What are the causes? This condition may be caused by:  Hashimoto's disease. This is a disease in which the body's disease-fighting system (immune system) attacks the thyroid gland. This is the most common cause.  Viral infections.  Pregnancy.  Certain medicines.  Birth defects.  Past radiation treatments to the head or neck for cancer.  Past treatment with radioactive iodine.  Past exposure to radiation in the environment.  Past surgical removal of part or all of the thyroid.  Problems with a gland in the center of the brain (pituitary gland).  Lack of enough iodine in the diet. What increases the risk? You are more likely to develop this condition if:  You are female.  You have a family history of thyroid conditions.  You use a medicine called lithium.  You take medicines that affect the immune system (immunosuppressants). What are the signs or symptoms? Symptoms of this condition include:  Feeling as though you have no energy (lethargy).  Not being able to tolerate cold.  Weight gain that is not explained by a change in diet or exercise habits.  Lack of appetite.  Dry skin.  Coarse hair.  Menstrual irregularity.  Slowing of thought processes.  Constipation.  Sadness or depression. How is this diagnosed? This condition may be diagnosed based on:  Your symptoms, your medical history, and a physical exam.  Blood  tests. You may also have imaging tests, such as an ultrasound or MRI. How is this treated? This condition is treated with medicine that replaces the thyroid hormones that your body does not make. After you begin treatment, it may take several weeks for symptoms to go away. Follow these instructions at home:  Take over-the-counter and prescription medicines only as told by your health care provider.  If you start taking any new medicines, tell your health care provider.  Keep all follow-up visits as told by your health care provider. This is important. ? As your condition improves, your dosage of thyroid hormone medicine may change. ? You will need to have blood tests regularly so that your health care provider can monitor your condition. Contact a health care provider if:  Your symptoms do not get better with treatment.  You are taking thyroid replacement medicine and you: ? Sweat a lot. ? Have tremors. ? Feel anxious. ? Lose weight rapidly. ? Cannot tolerate heat. ? Have emotional swings. ? Have diarrhea. ? Feel weak. Get help right away if you have:  Chest pain.  An irregular heartbeat.  A rapid heartbeat.  Difficulty breathing. Summary  Hypothyroidism is when the thyroid gland does not make enough of certain hormones (it is underactive).  When the thyroid is underactive, it produces too little of the hormones thyroxine (T4) and triiodothyronine (T3).  The most common cause is Hashimoto's disease, a disease in which the body's disease-fighting system (immune system) attacks the thyroid gland. The condition can also be caused by viral infections, medicine, pregnancy, or past   radiation treatment to the head or neck.  Symptoms may include weight gain, dry skin, constipation, feeling as though you do not have energy, and not being able to tolerate cold.  This condition is treated with medicine to replace the thyroid hormones that your body does not make. This information  is not intended to replace advice given to you by your health care provider. Make sure you discuss any questions you have with your health care provider. Document Revised: 01/24/2017 Document Reviewed: 01/22/2017 Elsevier Patient Education  2020 [employer]  

## 2019-10-28 NOTE — Progress Notes (Signed)
Subjective:  Patient ID: [name], female    DOB: 1960-03-19  Age: 59 y.o. MRN: [id number]  CC: Anemia and Hypothyroidism  This visit occurred during the SARS-CoV-2 public health emergency.  Safety protocols were in place, including screening questions prior to the visit, additional usage of staff PPE, and extensive cleaning of exam room while observing appropriate contact time as indicated for disinfecting solutions.   NEW TO ME   HPI Yumna Ebers presents for f/up - She comes in today to discuss the results of the CT scan that was done about 3 months ago.  She complains of chronic neck pain, back pain, and confusion.  Her CT scan of the brain was unremarkable.  The CT scan of the cervical spine showed spondylosis.  She wants to see a neurologist and a neurosurgeon to see what can be done about her conditions.  Outpatient Medications Prior to Visit  Medication Sig Dispense Refill  . acetaminophen (TYLENOL) 500 MG tablet Take 2 tablets (1,000 mg total) by mouth every 8 (eight) hours as needed for mild pain. 30 tablet 0  . acyclovir (ZOVIRAX) 400 MG tablet TAKE 1 TABLET BY MOUTH  TWICE DAILY 180 tablet 1  . albuterol (PROVENTIL) (2.5 MG/3ML) 0.083% nebulizer solution Take 3 mLs (2.5 mg total) by nebulization 2 (two) times daily. And every 6 hours as needed.  DX: J44.9 360 mL 3  . atenolol (TENORMIN) 25 MG tablet TAKE 1 TABLET BY MOUTH  DAILY 90 tablet 1  . atorvastatin (LIPITOR) 20 MG tablet TAKE 1 TABLET BY MOUTH  DAILY 90 tablet 1  . budesonide-formoterol (SYMBICORT) 160-4.5 MCG/ACT inhaler Inhale 2 puffs into the lungs 2 (two) times daily. 2 Inhaler 0  . buPROPion (WELLBUTRIN XL) 300 MG 24 hr tablet Take 300 mg by mouth daily.    . cyclobenzaprine (FLEXERIL) 10 MG tablet TAKE 1 TABLET BY MOUTH AT  BEDTIME 90 tablet 1  . hydrochlorothiazide (HYDRODIURIL) 25 MG tablet TAKE 1 TABLET BY MOUTH  DAILY 90 tablet 1  . Ipratropium-Albuterol (COMBIVENT RESPIMAT) 20-100 MCG/ACT AERS  respimat Inhale 1 puff into the lungs every 6 (six) hours as needed for wheezing. (Patient taking differently: Inhale 1 puff into the lungs in the morning and at bedtime. Take additional puff if needed at bedtime) 3 Inhaler 3  . levothyroxine (SYNTHROID) 25 MCG tablet TAKE 1 TABLET BY MOUTH  DAILY BEFORE BREAKFAST 90 tablet 1  . Liniments (SALONPAS ARTHRITIS PAIN RELIEF EX) Apply 1 patch topically daily as needed (to painful sites- remove as directed).     . Melatonin 10 MG TABS Take 10 mg by mouth at bedtime.    . montelukast (SINGULAIR) 10 MG tablet TAKE 1 TABLET BY MOUTH AT  BEDTIME (Patient taking differently: Take 10 mg by mouth at bedtime. ) 90 tablet 3  . Multiple Vitamins-Minerals (ADULT ONE DAILY GUMMIES) CHEW Chew 1 tablet by mouth daily.     . naproxen (NAPROSYN) 500 MG tablet Take 500 mg by mouth daily as needed for mild pain.     Marland Kitchen nystatin-triamcinolone ointment (MYCOLOG) Apply 1 application topically 2 (two) times daily as needed (applied to skin folds/groins/under breast skin irritation rash.). (Patient taking differently: Apply 1 application topically 2 (two) times daily as needed (to skin folds/groin/under breast skin- for irritation/rashes). ) 100 g 3  . OXYGEN Inhale 2-3 L/min into the lungs See admin instructions. Inhale  2-3 L/min of oxygen into the lungs w/CPAP at bedtime and as needed for shortness of  breath with "strenuous activity" during the day    . pantoprazole (PROTONIX) 40 MG tablet TAKE 1 TABLET BY MOUTH  DAILY 90 tablet 1  . PATADAY 0.2 % SOLN Apply 1 drop to eye daily.    . polyethylene glycol (MIRALAX / GLYCOLAX) 17 g packet Take 17 g by mouth daily as needed for mild constipation. 28 packet 0  . pregabalin (LYRICA) 75 MG capsule Take 1 capsule (75 mg total) by mouth 2 (two) times daily. (Patient taking differently: Take 150 mg by mouth at bedtime. ) 180 capsule 1  . QUEtiapine (SEROQUEL) 50 MG tablet TAKE 1 TABLET BY MOUTH AT  BEDTIME 30 tablet 1  . roflumilast  (DALIRESP) 500 MCG TABS tablet Take 1 tablet (500 mcg total) by mouth daily. 90 tablet 3  . traZODone (DESYREL) 100 MG tablet TAKE 1 TABLET BY MOUTH AT  BEDTIME (Patient taking differently: Take 100 mg by mouth at bedtime. ) 90 tablet 3  . venlafaxine XR (EFFEXOR-XR) 150 MG 24 hr capsule TAKE 1 CAPSULE BY MOUTH  DAILY WITH BREAKFAST (Patient taking differently: Take 150 mg by mouth daily. ) 90 capsule 3  . diphenhydrAMINE (BENADRYL) 25 MG tablet Take 25 mg by mouth 2 (two) times daily.     Marland Kitchen azithromycin (ZITHROMAX) 250 MG tablet Zpack taper as directed 6 tablet 0  . docusate sodium (COLACE) 100 MG capsule Take 1 capsule (100 mg total) by mouth 2 (two) times daily. (Patient taking differently: Take 100 mg by mouth at bedtime. ) 28 capsule 0  . methylPREDNISolone (MEDROL DOSEPAK) 4 MG TBPK tablet As directed 21 tablet 0  . traMADol (ULTRAM) 50 MG tablet Take 1 tablet (50 mg total) by mouth every 6 (six) hours as needed. 20 tablet 0   No facility-administered medications prior to visit.    ROS Review of Systems  Constitutional: Positive for unexpected weight change (wt gain). Negative for appetite change, chills, diaphoresis and fatigue.  HENT: Negative.  Negative for trouble swallowing.   Eyes: Negative for visual disturbance.  Respiratory: Negative for cough, chest tightness, shortness of breath and wheezing.   Cardiovascular: Negative for chest pain, palpitations and leg swelling.  Gastrointestinal: Negative for abdominal pain, constipation, diarrhea, nausea and vomiting.  Endocrine: Negative for cold intolerance and heat intolerance.  Genitourinary: Negative.  Negative for difficulty urinating.  Musculoskeletal: Positive for back pain, gait problem and neck pain. Negative for arthralgias.  Skin: Negative.   Neurological: Positive for tremors and weakness. Negative for dizziness, seizures, syncope, facial asymmetry, speech difficulty, light-headedness, numbness and headaches.    Hematological: Negative for adenopathy. Does not bruise/bleed easily.  Psychiatric/Behavioral: Negative.     Objective:  BP 116/72   Pulse 66   Temp 97.9 F (36.6 C) (Oral)   Ht 5\' 7"  (1.702 m)   SpO2 94%   BMI 32.48 kg/m   BP Readings from Last 3 Encounters:  10/28/19 116/72  10/21/19 132/82  07/09/19 95/76    Wt Readings from Last 3 Encounters:  10/21/19 207 lb 6.4 oz (94.1 kg)  07/09/19 200 lb (90.7 kg)  06/15/19 201 lb 9.6 oz (91.4 kg)    Physical Exam Vitals reviewed.  Constitutional:      General: She is not in acute distress.    Appearance: She is obese. She is ill-appearing (in a wheelchair). She is not toxic-appearing or diaphoretic.  HENT:     Nose: Nose normal.     Mouth/Throat:     Mouth: Mucous membranes are moist.  Eyes:     General: No scleral icterus.    Conjunctiva/sclera: Conjunctivae normal.  Cardiovascular:     Rate and Rhythm: Normal rate and regular rhythm.     Heart sounds: No murmur heard.      Comments: EKG- Sinus bradycardia, 57 bpm No LVH ST/T changes in anterolateral leads are unchanged compared to the prior EKG Pulmonary:     Effort: Pulmonary effort is normal.     Breath sounds: No stridor. No wheezing, rhonchi or rales.  Abdominal:     General: Abdomen is protuberant. Bowel sounds are normal. There is no distension.     Palpations: Abdomen is soft. There is no hepatomegaly, splenomegaly or mass.     Tenderness: There is no abdominal tenderness.  Musculoskeletal:        General: Normal range of motion.     Cervical back: Neck supple.     Right lower leg: No edema.     Left lower leg: No edema.  Lymphadenopathy:     Cervical: No cervical adenopathy.  Skin:    General: Skin is warm and dry.  Neurological:     General: No focal deficit present.     Mental Status: She is alert.     Cranial Nerves: Cranial nerves are intact. No dysarthria or facial asymmetry.     Sensory: Sensation is intact.     Motor: Weakness, tremor,  atrophy and abnormal muscle tone present. No seizure activity.     Coordination: Romberg sign positive. Coordination abnormal.     Deep Tendon Reflexes: Reflexes normal.     Reflex Scores:      Tricep reflexes are 0 on the right side and 0 on the left side.      Bicep reflexes are 0 on the right side and 0 on the left side.      Brachioradialis reflexes are 0 on the right side and 0 on the left side.      Patellar reflexes are 0 on the right side and 0 on the left side.      Achilles reflexes are 0 on the right side and 0 on the left side.    Comments: Diffuse weakness and tremors     Lab Results  Component Value Date   WBC 10.1 10/28/2019   HGB 13.7 10/28/2019   HCT 39.9 10/28/2019   PLT 334 10/28/2019   GLUCOSE 102 (H) 10/28/2019   CHOL 125 12/17/2018   TRIG 261.0 (H) 12/17/2018   HDL 37.80 (L) 12/17/2018   LDLDIRECT 56.0 12/17/2018   LDLCALC 62 08/19/2017   ALT 25 04/29/2019   AST 18 04/29/2019   NA 138 10/28/2019   K 3.9 10/28/2019   CL 93 (L) 10/28/2019   CREATININE 1.18 (H) 10/28/2019   BUN 14 10/28/2019   CO2 33 (H) 10/28/2019   TSH 0.89 10/28/2019   INR 0.94 02/03/2017   HGBA1C 6.7 (H) 10/28/2019   MICROALBUR <0.7 12/17/2018    No results found.  Assessment & Plan:   Natelie was seen today for anemia and hypothyroidism.  Diagnoses and all orders for this visit:  Hypothyroidism due to acquired atrophy of thyroid- Her TSH is in the normal range.  She will stay on the current dose of levothyroxine. -     TSH; Future -     TSH  Iron deficiency anemia due to dietary causes- Her H&H and iron levels are normal now. -     Iron; Future -     CBC  with Differential/Platelet; Future -     Ferritin; Future -     Ferritin -     CBC with Differential/Platelet -     Iron  Essential hypertension- Her blood pressure is well controlled.  Her EKG is negative for LVH. -     BASIC METABOLIC PANEL WITH GFR; Future -     EKG 12-Lead -     BASIC METABOLIC PANEL WITH  GFR  Cervical spondylosis without myelopathy -     Ambulatory referral to Neurosurgery  Prediabetes -     Hemoglobin A1c; Future -     BASIC METABOLIC PANEL WITH GFR; Future -     Amb Referral to Nutrition and Diabetic E -     BASIC METABOLIC PANEL WITH GFR -     Hemoglobin A1c  Coarse tremors -     Ambulatory referral to Neurology  Type II diabetes mellitus with manifestations ([place])- Her A1c is at 6.7%.  Medical therapy is not yet indicated.  Other orders -     Flu Vaccine QUAD 6+ mos PF IM (Fluarix Quad PF)   I have discontinued Falan C. Oxley's diphenhydrAMINE, docusate sodium, traMADol, azithromycin, and methylPREDNISolone. I am also having her maintain her nystatin-triamcinolone ointment, OXYGEN, Ipratropium-Albuterol, budesonide-formoterol, Daliresp, Liniments (SALONPAS ARTHRITIS PAIN RELIEF EX), venlafaxine XR, traZODone, Melatonin, montelukast, acetaminophen, polyethylene glycol, naproxen, buPROPion, Adult One Daily Gummies, pregabalin, albuterol, atenolol, hydrochlorothiazide, atorvastatin, pantoprazole, acyclovir, cyclobenzaprine, levothyroxine, Pataday, and QUEtiapine.  No orders of the defined types were placed in this encounter.    Follow-up: Return in about 6 months (around 04/26/2020).  Scarlette Calico, MD

## 2019-10-29 ENCOUNTER — Encounter: Payer: Self-pay | Admitting: Internal Medicine

## 2019-10-29 DIAGNOSIS — E118 Type 2 diabetes mellitus with unspecified complications: Secondary | ICD-10-CM | POA: Insufficient documentation

## 2019-10-29 LAB — FERRITIN: Ferritin: 40 ng/mL (ref 16–232)

## 2019-10-29 LAB — BASIC METABOLIC PANEL WITH GFR
BUN/Creatinine Ratio: 12 (calc) (ref 6–22)
BUN: 14 mg/dL (ref 7–25)
CO2: 33 mmol/L — ABNORMAL HIGH (ref 20–32)
Calcium: 10.1 mg/dL (ref 8.6–10.4)
Chloride: 93 mmol/L — ABNORMAL LOW (ref 98–110)
Creat: 1.18 mg/dL — ABNORMAL HIGH (ref 0.50–1.05)
GFR, Est African American: 59 mL/min/{1.73_m2} — ABNORMAL LOW (ref >=60)
GFR, Est Non African American: 51 mL/min/{1.73_m2} — ABNORMAL LOW (ref >=60)
Glucose, Bld: 102 mg/dL — ABNORMAL HIGH (ref 65–99)
Potassium: 3.9 mmol/L (ref 3.5–5.3)
Sodium: 138 mmol/L (ref 135–146)

## 2019-10-29 LAB — CBC WITH DIFFERENTIAL/PLATELET
Absolute Monocytes: 909 cells/uL (ref 200–950)
Basophils Absolute: 91 cells/uL (ref 0–200)
Basophils Relative: 0.9 %
Eosinophils Absolute: 263 cells/uL (ref 15–500)
Eosinophils Relative: 2.6 %
HCT: 39.9 % (ref 35.0–45.0)
Hemoglobin: 13.7 g/dL (ref 11.7–15.5)
Lymphs Abs: 2707 cells/uL (ref 850–3900)
MCH: 30.4 pg (ref 27.0–33.0)
MCHC: 34.3 g/dL (ref 32.0–36.0)
MCV: 88.5 fL (ref 80.0–100.0)
MPV: 10.7 fL (ref 7.5–12.5)
Monocytes Relative: 9 %
Neutro Abs: 6131 cells/uL (ref 1500–7800)
Neutrophils Relative %: 60.7 %
Platelets: 334 10*3/uL (ref 140–400)
RBC: 4.51 10*6/uL (ref 3.80–5.10)
RDW: 14 % (ref 11.0–15.0)
Total Lymphocyte: 26.8 %
WBC: 10.1 10*3/uL (ref 3.8–10.8)

## 2019-10-29 LAB — HEMOGLOBIN A1C
Hgb A1c MFr Bld: 6.7 % of total Hgb — ABNORMAL HIGH (ref <5.7)
Mean Plasma Glucose: 146 (calc)
eAG (mmol/L): 8.1 (calc)

## 2019-10-29 LAB — IRON: Iron: 71 ug/dL (ref 45–160)

## 2019-10-29 LAB — TSH: TSH: 0.89 mIU/L (ref 0.40–4.50)

## 2019-10-30 ENCOUNTER — Other Ambulatory Visit: Payer: Self-pay | Admitting: Pulmonary Disease

## 2019-11-04 ENCOUNTER — Telehealth (HOSPITAL_COMMUNITY): Payer: Self-pay | Admitting: *Deleted

## 2019-11-09 ENCOUNTER — Ambulatory Visit (INDEPENDENT_AMBULATORY_CARE_PROVIDER_SITE_OTHER): Payer: Medicare Other | Admitting: Licensed Clinical Social Worker

## 2019-11-09 DIAGNOSIS — F0630 Mood disorder due to known physiological condition, unspecified: Secondary | ICD-10-CM | POA: Diagnosis not present

## 2019-11-09 DIAGNOSIS — J449 Chronic obstructive pulmonary disease, unspecified: Secondary | ICD-10-CM | POA: Diagnosis not present

## 2019-11-09 DIAGNOSIS — F411 Generalized anxiety disorder: Secondary | ICD-10-CM

## 2019-11-09 DIAGNOSIS — R443 Hallucinations, unspecified: Secondary | ICD-10-CM

## 2019-11-09 DIAGNOSIS — J441 Chronic obstructive pulmonary disease with (acute) exacerbation: Secondary | ICD-10-CM | POA: Diagnosis not present

## 2019-11-09 DIAGNOSIS — J9611 Chronic respiratory failure with hypoxia: Secondary | ICD-10-CM | POA: Diagnosis not present

## 2019-11-09 DIAGNOSIS — F4312 Post-traumatic stress disorder, chronic: Secondary | ICD-10-CM

## 2019-11-09 NOTE — Progress Notes (Signed)
Virtual Visit via Telephone Note  Therapist-home office Patient-home I connected with [name] on 11/09/19 at  2:00 PM EDT by telephone and verified that I am speaking with the correct person using two identifiers.   I discussed the limitations, risks, security and privacy concerns of performing an evaluation and management service by telephone and the availability of in person appointments. I also discussed with the patient that there may be a patient responsible charge related to this service. The patient expressed understanding and agreed to proceed.   I discussed the assessment and treatment plan with the patient. The patient was provided an opportunity to ask questions and all were answered. The patient agreed with the plan and demonstrated an understanding of the instructions.   The patient was advised to call back or seek an in-person evaluation if the symptoms worsen or if the condition fails to improve as anticipated.  I provided 54 minutes of non-face-to-face time during this encounter.  THERAPIST PROGRESS NOTE  Session Time: 2:00 PM to 2:54 PM  Participation Level: Active  Behavioral Response: CasualAlertEuthymic and Irritable  Type of Therapy: Individual Therapy  Treatment Goals addressed:  PTSD, coping, Interventions: Solution Focused, Strength-based, Supportive and Other: coping  Summary: Tywana Robotham is a 59 y.o. female who presents with went to doctor about neck and haven't heard anything. When neck fell out gets numbness and in pain everyday. Takes "Tiger Gel" every day. A lotion, rub in works but doesn't last all day. She wanted the CAT scan read to her but didn't call her about that. Doctor said she had severe arthritis. Patient said now what? Patient said was referred to a surgeon in the past. She is looking for a neurosurgeon and didn't get right referrals sent. Told them headaches and blurry vision all could be stemming from neck. "Spine is main  part of body". Patient said serious forgetfulness. Shakes not consistent more off than on. CAT scan of head and neck. Doctor says didn't see anything with head and only arthritis. For patient doesn't make sense what is he saying. Wants to see a specialist regular doctor is out on leave. Plans to find own neurosurgeon. Explored source of neck problem had a major fall flying in the air and fell on cement stairs. Happened about four years. They took her by rescue to the hospital. From lower back couldn't walk, couldn't stand and legs buckle up under her and had to use a wheelchair to get around. Had surgery. Has two bars and four screws in her back. Tried the Cortizone. Doctor said not working and she was in a lot of pain. Had another surgery and went well. Not for neck. Was going to have surgery on the neck. Two hip replacements. Had two of them. First time it was done limping was getting to her all the pressure on the right leg. Add to it a "washer" gave a couple of extra inches. So two feet could be straight on the ground. It worked really well. In room getting up, doesn't know what she was doing, may have leaned into the left hip and lean over that way. All of sudden in so much pain. That is what happen to her left hip. Reset the metal again. Laid up for 10 weeks. Longer than for the surgery. Therapist pointed out that it is one thing to the next and patient said "everything happened all at once". In terms of mood overall patient says things are going pretty good so far. "  Then brother acts up and then thrown better," but getting along better. Reviewed what we talked about from last session. Married twice. Were talking about 2nd marriage. 1st marriage was ok, was young and had three kids by him. 16 months apart. Got tubes tied. Patient wanted to go out and do things separated. So much happened when she filed for divorce. He didn't want it. Were together 10-11 years. It was a shot gun wedding and Dad hated him  because she was too young. After the fact tell kids bad things about her. Paying for it now not back then what he said to them.  Thought they would find out the truth but haven't happened and they don't believe what she said. They said it is the past. Calls him "Johann Capers the [facility]" Therapist asks why and she said that is what he was. He was not the only young girl he went after but only one he stay with. A lot of things happened with their dad related to where her relationship with kids are today. He took them. Patient was in drinking so said go ahead. Choose alcohol over them but good thing in that in right mind not to have them. Not selfish to keep them with alcohol problem. He introduced her to alcohol he is still telling the kids things about her before he died. That she had an issue about drugs. Patient said only 15 so what was he talking about and he brought into the house for her to do. Was able to see kids lost rights to kids. With Education officer, museum and meet her at [facility] to see her kids. Brought her son and brought [name]. Not sure where she is in terms of working through her feelings about all of this.  A lot of times guilt and lot of times "let it fly by". Try but not always work because of what father said. Will continue next session as ran out of time for this session          Suicidal/Homicidal: No  Therapist Response: Therapist reviewed symptoms, facilitated expression of thoughts and feelings to help with coping with both current stressors as well as review of past history with kids and working through her feelings of this past history.  Validated patient on how she felt about different stressors both her current medical and also past history with her first marriage.  Therapist continues to encourage patient to focus on her strengths, resources and resiliency, work on Child psychotherapist.  Work on ways to reframe things positively in a relationship with brother in her relationship with kids  encouraging her to develop a relationship she has with him now especially can although appears their challenges that we need to continue to talk about in process.  Therapist focused on open questions active listening supportive interventions  Plan: Return again in 2 weeks.2.Therapist work with patient on grief continuing for her to process feelings, PTSD and coping  Diagnosis: Axis I:   PTSD, hallucinations, GAD, Mood Disorder in conditions classified elsewhere    Axis II: No diagnosis    [name], LCSW 11/09/2019

## 2019-11-17 ENCOUNTER — Ambulatory Visit: Payer: Medicare Other | Admitting: Pulmonary Disease

## 2019-11-17 DIAGNOSIS — G4733 Obstructive sleep apnea (adult) (pediatric): Secondary | ICD-10-CM | POA: Diagnosis not present

## 2019-11-17 DIAGNOSIS — J449 Chronic obstructive pulmonary disease, unspecified: Secondary | ICD-10-CM | POA: Diagnosis not present

## 2019-11-17 DIAGNOSIS — J441 Chronic obstructive pulmonary disease with (acute) exacerbation: Secondary | ICD-10-CM | POA: Diagnosis not present

## 2019-11-19 ENCOUNTER — Other Ambulatory Visit (HOSPITAL_COMMUNITY): Payer: Self-pay | Admitting: Psychiatry

## 2019-11-22 ENCOUNTER — Ambulatory Visit: Payer: Medicare Other | Admitting: Acute Care

## 2019-11-22 ENCOUNTER — Ambulatory Visit: Payer: Medicare Other | Admitting: Pulmonary Disease

## 2019-11-23 ENCOUNTER — Ambulatory Visit (INDEPENDENT_AMBULATORY_CARE_PROVIDER_SITE_OTHER): Payer: Medicare Other | Admitting: Licensed Clinical Social Worker

## 2019-11-23 DIAGNOSIS — F0630 Mood disorder due to known physiological condition, unspecified: Secondary | ICD-10-CM

## 2019-11-23 DIAGNOSIS — F411 Generalized anxiety disorder: Secondary | ICD-10-CM

## 2019-11-23 DIAGNOSIS — J449 Chronic obstructive pulmonary disease, unspecified: Secondary | ICD-10-CM | POA: Diagnosis not present

## 2019-11-23 DIAGNOSIS — F4312 Post-traumatic stress disorder, chronic: Secondary | ICD-10-CM | POA: Diagnosis not present

## 2019-11-23 DIAGNOSIS — J9611 Chronic respiratory failure with hypoxia: Secondary | ICD-10-CM | POA: Diagnosis not present

## 2019-11-23 DIAGNOSIS — G4733 Obstructive sleep apnea (adult) (pediatric): Secondary | ICD-10-CM | POA: Diagnosis not present

## 2019-11-23 DIAGNOSIS — J441 Chronic obstructive pulmonary disease with (acute) exacerbation: Secondary | ICD-10-CM | POA: Diagnosis not present

## 2019-11-23 NOTE — Progress Notes (Signed)
Virtual Visit via Video Note  Therapist-home office Patient-home I connected with Joan [name] on 11/23/19 at  1:00 PM EDT by a video enabled telemedicine application and verified that I am speaking with the correct person using two identifiers.   I discussed the limitations of evaluation and management by telemedicine and the availability of in person appointments. The patient expressed understanding and agreed to proceed.   I discussed the assessment and treatment plan with the patient. The patient was provided an opportunity to ask questions and all were answered. The patient agreed with the plan and demonstrated an understanding of the instructions.   The patient was advised to call back or seek an in-person evaluation if the symptoms worsen or if the condition fails to improve as anticipated.  I provided 54 minutes of non-face-to-face time during this encounter.  THERAPIST PROGRESS NOTE  Session Time: 1:00 PM to 1:54 PM  Participation Level: Active  Behavioral Response: CasualAlertEuthymic  Type of Therapy: Individual Therapy  Treatment Goals addressed:  PTSD, coping, Interventions: CBT, Solution Focused, Strength-based, Supportive, Reframing and Other: coping  Summary: Joan [name] is a 59 y.o. female who presents with doing good. Alone in the house and likes that. Nice to be alone. Tries to sit down and have a conversation with brother but as soon as she talks to him about him the arguing starts and he gets up and leaves. Says nasty things to her. Discussed how it is not easy to live with someone who says mean things.   Sees neurosurgeon for neck in October. A doctor who she saw in PCP office when PCP was away told her that she her shakes were tremors. Her shoulders and arm get shaky, weird feeling and heavy but not constant. Patient thinks that tremors would be continuous so doesn't think that is an accurate diagnosis. Patient thinks it is related to neck. Relates  issues on her whole side from shoulder, "wing bone in back" goes into a lot of pain.  with that. Takes Naprosyn, use Vicodin from surgery.  Patient endorses lots of pain from this.  Needed surgery years ago. Had two back surgeries then Cortizone shots.  Doctor wants her to see neurosurgon was in a wheel chair when couldn't work for a little while. Cortizone shot didn't work suggested surgery but doctor left the hospital. Didn't want anyone else to do it. Now has a referral for neurosurgeon but doesn't want anyone else out of network. Reviewed what talking about last session and relationship with kids. Not allowed to be mother now because they don't need it. What she says or do doesn't make a difference especially with daughter, Patsi Sears Producer, television/film/video). Also close with Cecilie Lowers her son. Close with daughter for awhile now. Close with son and hung out with her when younger. Head wasn't right when had them and a mess. Therapist explored whether there are issues to work out. "Things are ok". Patient was more a friend rather than a mom. Thinks she may be trying to buy daughter's love and appreciation. Betsy Coder goes with the flow "More like me" Older daughter is mixture of patient and dad and "that is not a good mixture". Not in touch with older daughter as much because she is working and going to school, has a daughter. Younger daughter is [name] not as close because they were more in her life then she was. She was adopted but would like to be close. Every time tries to talk to her it is "too  much" with her kids. Both children have issues, mental health issues. Yolanda Bonine is special classes and granddaugher is in more regular classes.  Reviewed session and what we talked about for session major issues patient is dealing with.  Suicidal/Homicidal: No  Therapist Response: Therapist reviewed symptoms, facilitated expression of thoughts and feelings utilize this is the main intervention strategy for patient in managing current  stressors including medical, stressors in her relationship with her brother.  Validated patient on how she is feeling as well as encouraging her to stay focused on her strengths resources and resiliency.  Worked on stress management strategies.  Continue to gather background information and where needed to process feelings to work through past issues in this case relationship with her kids.  Therapist provided active listening open questions supportive interventions  Plan: Return again in 2 weeks.2.Therapist work with patient on grief continuing for her to process feelings, PTSD and coping  Diagnosis: Axis I: PTSD, hallucinations, GAD, Mood Disorder in conditions classified elsewhere    Axis II: No diagnosis    [name], LCSW 11/23/2019

## 2019-11-24 ENCOUNTER — Encounter (HOSPITAL_COMMUNITY): Payer: Self-pay

## 2019-11-27 ENCOUNTER — Encounter: Payer: Self-pay | Admitting: Internal Medicine

## 2019-11-29 NOTE — Telephone Encounter (Signed)
Spoke to pt to let her know [facility] is an orthopedic surgery office and [facility] is where we send our neurosurgery referrals.  She is agreeable with [facility].

## 2019-11-30 ENCOUNTER — Ambulatory Visit (HOSPITAL_COMMUNITY): Payer: Medicare Other | Admitting: Licensed Clinical Social Worker

## 2019-11-30 NOTE — Progress Notes (Signed)
Therapist contacted patient through my chart and by phone and patient did not respond. Session is a no show.

## 2019-12-01 DIAGNOSIS — M47812 Spondylosis without myelopathy or radiculopathy, cervical region: Secondary | ICD-10-CM | POA: Diagnosis not present

## 2019-12-01 DIAGNOSIS — M5412 Radiculopathy, cervical region: Secondary | ICD-10-CM | POA: Diagnosis not present

## 2019-12-01 DIAGNOSIS — M542 Cervicalgia: Secondary | ICD-10-CM | POA: Diagnosis not present

## 2019-12-02 ENCOUNTER — Other Ambulatory Visit: Payer: Self-pay | Admitting: Neurological Surgery

## 2019-12-02 DIAGNOSIS — M5412 Radiculopathy, cervical region: Secondary | ICD-10-CM

## 2019-12-03 ENCOUNTER — Other Ambulatory Visit: Payer: Self-pay | Admitting: Pulmonary Disease

## 2019-12-03 ENCOUNTER — Encounter: Payer: Self-pay | Admitting: Allergy & Immunology

## 2019-12-03 MED ORDER — LEVOFLOXACIN 500 MG PO TABS: 500.0000 mg | ORAL_TABLET | Freq: Every day | ORAL | 0 refills | Status: DC

## 2019-12-03 MED ORDER — METHYLPREDNISOLONE 4 MG PO TABS: 4.0000 mg | ORAL_TABLET | Freq: Every day | ORAL | 0 refills | Status: DC

## 2019-12-03 NOTE — Telephone Encounter (Signed)
Medrol and levaquin sent in to pharmacy

## 2019-12-03 NOTE — Telephone Encounter (Signed)
Dr. Jenetta Downer please advise on patient email:   My name is Joan [name] my date of birth is [phone] I'm generally given Levaquin antibiotic and Solu-Medrol in pill form

## 2019-12-06 ENCOUNTER — Other Ambulatory Visit: Payer: Self-pay | Admitting: Internal Medicine

## 2019-12-06 ENCOUNTER — Telehealth: Payer: Self-pay | Admitting: Internal Medicine

## 2019-12-06 DIAGNOSIS — M47812 Spondylosis without myelopathy or radiculopathy, cervical region: Secondary | ICD-10-CM

## 2019-12-06 NOTE — Progress Notes (Signed)
°  Chronic Care Management   Outreach Note  12/06/2019 Name: Joan [name] MRN: [id number] DOB: Dec 03, 1960  Referred by: [name], MD Reason for referral : No chief complaint on file.   An unsuccessful telephone outreach was attempted today. The patient was referred to the pharmacist for assistance with care management and care coordination.   Follow Up Plan:   Carley Perdue [employer] Scheduler

## 2019-12-08 ENCOUNTER — Telehealth: Payer: Self-pay | Admitting: Allergy & Immunology

## 2019-12-08 NOTE — Telephone Encounter (Signed)
Patient called and had to cancel her appointment for tomorrow because she is taking antih. But she said that she neever received a call about her blood work she had done. [phone]

## 2019-12-09 ENCOUNTER — Ambulatory Visit: Payer: Medicare Other | Admitting: Allergy & Immunology

## 2019-12-09 DIAGNOSIS — J441 Chronic obstructive pulmonary disease with (acute) exacerbation: Secondary | ICD-10-CM | POA: Diagnosis not present

## 2019-12-09 DIAGNOSIS — J9611 Chronic respiratory failure with hypoxia: Secondary | ICD-10-CM | POA: Diagnosis not present

## 2019-12-09 DIAGNOSIS — J449 Chronic obstructive pulmonary disease, unspecified: Secondary | ICD-10-CM | POA: Diagnosis not present

## 2019-12-09 NOTE — Telephone Encounter (Signed)
I sent a MyChart message last week. See the message copied and pasted below:   Hi, Ms. Korson!   I am so very sorry about this oversight and I truly appreciate you reaching out. I must have clicked on the result and then moved on to something else and forgot about it. I am usually much better about this. My sincerest apologies!   We did environmental allergy testing via the blood when we saw you. Your lung function was not great and we did not want to do skin testing since this can worsen breathing in certain patients. Based on your lung function, you did not have a lot of reserves left and we did not want to make things worse. Your entire test was negative, meaning you did not have evidence of environmental allergies via your blood. This is why we scheduled you for testing later this week I believe. Hopefully we can try to figure out why you are having such nasal congestion issues. The skin testing is MORE sensitive, meaning it might pick up something that the blood testing did not.   Does this make sense?   Thanks so much, Salvatore Marvel, MD [facility]

## 2019-12-09 NOTE — Telephone Encounter (Signed)
I see we received the results but do not see where you have documented on them please advise

## 2019-12-09 NOTE — Telephone Encounter (Signed)
Lm for pt to call us back  

## 2019-12-10 ENCOUNTER — Telehealth (HOSPITAL_COMMUNITY): Payer: Self-pay

## 2019-12-10 ENCOUNTER — Ambulatory Visit (INDEPENDENT_AMBULATORY_CARE_PROVIDER_SITE_OTHER): Payer: Medicare Other | Admitting: Pulmonary Disease

## 2019-12-10 ENCOUNTER — Other Ambulatory Visit: Payer: Self-pay

## 2019-12-10 ENCOUNTER — Encounter: Payer: Self-pay | Admitting: Pulmonary Disease

## 2019-12-10 DIAGNOSIS — J9611 Chronic respiratory failure with hypoxia: Secondary | ICD-10-CM

## 2019-12-10 DIAGNOSIS — Z9989 Dependence on other enabling machines and devices: Secondary | ICD-10-CM

## 2019-12-10 DIAGNOSIS — J449 Chronic obstructive pulmonary disease, unspecified: Secondary | ICD-10-CM

## 2019-12-10 DIAGNOSIS — G4733 Obstructive sleep apnea (adult) (pediatric): Secondary | ICD-10-CM | POA: Diagnosis not present

## 2019-12-10 NOTE — Assessment & Plan Note (Addendum)
Continue on current regimen of Symbicort twice daily, rinse mouth after use Use albuterol nebs thrice daily as needed Okay to use Combivent MDI 2 puffs every 6 hours as needed as rescue  She has gotten medications approved from the company so would prefer to stay on Symbicort. In the future we can consider escalating to Kieth Brightly is not a true allergy to prednisone, if needed preferably use Medrol Dosepak

## 2019-12-10 NOTE — Assessment & Plan Note (Signed)
Continue oxygen blended into CPAP machine during sleep

## 2019-12-10 NOTE — Progress Notes (Signed)
   Subjective:    Patient ID: Joan [name], female    DOB: 04/24/1960, 59 y.o.   MRN: [id number]  HPI  59 yo ex-smokerforfollow-up of severe COPD and OSA  - quit smoking in October 2020   43-month follow-up visit Underwent appendectomy 03/2019 for gangrenous appendicitis , then revision Reports increased dyspnea on exertion especially in the mornings, white phlegm in the mornings. She uses albuterol nebs thrice daily. Compliant with Symbicort twice daily. Confused as to which inhaler she should use for rescue-albuterol versus Combivent.  She complains of hoarseness, has seen ENT in the past.  Covid vaccinations up-to-date, questions about booster.  CPAP download was reviewed, no problems with mask or pressure,  She called a few days ago -complaining of cough and increased dyspnea  Meds -allergy listed to prednisone-this caused pedal edema, prefers Medrol when needed       Significant tests/ events reviewed  NPSG 02/2017 >> no OSA HST 04/2017 15/h   02/2019 pFTS >> RATIO 45, fev 1 37% - UNCHANGED , + bd response, DLCO 44%  10/2016 PFTs showed ratio 49, FEV1 of 36%, FVC 58% and DLCO of 34% consistent with severe obstruction.  04/2019 CT angio chest >> no new nodules 11/2018 LDCT >> 2S, stable nodules 2.8 mm largest 11/2016 CT chestbullous changes BL, 20mmRUl & 2 mm LULnodules >> stable 11/2017   ONO10/2018 on CPAP/room air showed desaturation for 1 hour 45 minutes started on nocturnal oxygen>>  Review of Systems Patient denies significant dyspnea,cough, hemoptysis,  chest pain, palpitations, pedal edema, orthopnea, paroxysmal nocturnal dyspnea, lightheadedness, nausea, vomiting, abdominal or  leg pains      Objective:   Physical Exam   Gen. Pleasant, obese, in no distress ENT - no lesions, no post nasal drip Neck: No JVD, no thyromegaly, no carotid bruits Lungs: no use of accessory muscles, no dullness to percussion, decreased without  rales or rhonchi  Cardiovascular: Rhythm regular, heart sounds  normal, no murmurs or gallops, no peripheral edema Musculoskeletal: No deformities, no cyanosis or clubbing , no tremors        Assessment & Plan:

## 2019-12-10 NOTE — Telephone Encounter (Signed)
Unable to reach pt in regards to Pulmonary Rehab Closed referral 

## 2019-12-10 NOTE — Assessment & Plan Note (Signed)
Download was reviewed which shows sporadic compliance although averages 5 hours on nights used, mild leak.   Weight loss encouraged, compliance with goal of at least 4-6 hrs every night is the expectation. Advised against medications with sedative side effects Cautioned against driving when sleepy - understanding that sleepiness will vary on a day to day basis

## 2019-12-10 NOTE — Patient Instructions (Signed)
Continue on current regimen of Symbicort twice daily, rinse mouth after use Use albuterol nebs thrice daily as needed Okay to use Combivent MDI 2 puffs every 6 hours as needed as rescue  Try to use CPAP at least 4 hours every night. Oxygen during sleep

## 2019-12-22 ENCOUNTER — Telehealth: Payer: Self-pay | Admitting: Allergy & Immunology

## 2019-12-22 NOTE — Telephone Encounter (Signed)
Patient was returning a nurse call

## 2019-12-22 NOTE — Telephone Encounter (Signed)
Called and talked to patient and discussed her lab work and read the letter that Dr. Ernst Bowler sent to her via mychart. She agreed to keep her appointment and discuss any further issues or concerns at that time. She did express that she had no concerns at this time but she was still dealing with the congestion heavily.

## 2019-12-23 DIAGNOSIS — G4733 Obstructive sleep apnea (adult) (pediatric): Secondary | ICD-10-CM | POA: Diagnosis not present

## 2019-12-23 DIAGNOSIS — J441 Chronic obstructive pulmonary disease with (acute) exacerbation: Secondary | ICD-10-CM | POA: Diagnosis not present

## 2019-12-23 DIAGNOSIS — J449 Chronic obstructive pulmonary disease, unspecified: Secondary | ICD-10-CM | POA: Diagnosis not present

## 2019-12-23 DIAGNOSIS — J9611 Chronic respiratory failure with hypoxia: Secondary | ICD-10-CM | POA: Diagnosis not present

## 2019-12-25 ENCOUNTER — Ambulatory Visit
Admission: RE | Admit: 2019-12-25 | Discharge: 2019-12-25 | Disposition: A | Discharge status: Home / Self Care | Payer: Medicare Other | Source: Ambulatory Visit | Attending: Neurological Surgery | Admitting: Neurological Surgery

## 2019-12-25 ENCOUNTER — Other Ambulatory Visit (HOSPITAL_COMMUNITY): Payer: Self-pay | Admitting: Psychiatry

## 2019-12-25 ENCOUNTER — Other Ambulatory Visit: Payer: Self-pay

## 2019-12-25 DIAGNOSIS — M4802 Spinal stenosis, cervical region: Secondary | ICD-10-CM | POA: Diagnosis not present

## 2019-12-25 DIAGNOSIS — M5412 Radiculopathy, cervical region: Secondary | ICD-10-CM

## 2019-12-25 DIAGNOSIS — M50223 Other cervical disc displacement at C6-C7 level: Secondary | ICD-10-CM | POA: Diagnosis not present

## 2019-12-25 DIAGNOSIS — M47812 Spondylosis without myelopathy or radiculopathy, cervical region: Secondary | ICD-10-CM | POA: Diagnosis not present

## 2019-12-25 DIAGNOSIS — R531 Weakness: Secondary | ICD-10-CM | POA: Diagnosis not present

## 2019-12-28 ENCOUNTER — Ambulatory Visit (INDEPENDENT_AMBULATORY_CARE_PROVIDER_SITE_OTHER): Payer: Medicare Other | Admitting: Licensed Clinical Social Worker

## 2019-12-28 DIAGNOSIS — F4312 Post-traumatic stress disorder, chronic: Secondary | ICD-10-CM | POA: Diagnosis not present

## 2019-12-28 DIAGNOSIS — R443 Hallucinations, unspecified: Secondary | ICD-10-CM | POA: Diagnosis not present

## 2019-12-28 DIAGNOSIS — F411 Generalized anxiety disorder: Secondary | ICD-10-CM | POA: Diagnosis not present

## 2019-12-28 DIAGNOSIS — F0630 Mood disorder due to known physiological condition, unspecified: Secondary | ICD-10-CM | POA: Diagnosis not present

## 2019-12-28 NOTE — Progress Notes (Signed)
Virtual Visit via Telephone Note  I connected with [name] on 12/28/19 at  2:00 PM EDT by telephone and verified that I am speaking with the correct person using two identifiers.  Location: Patient: home Provider: home office   I discussed the limitations, risks, security and privacy concerns of performing an evaluation and management service by telephone and the availability of in person appointments. I also discussed with the patient that there may be a patient responsible charge related to this service. The patient expressed understanding and agreed to proceed.   I discussed the assessment and treatment plan with the patient. The patient was provided an opportunity to ask questions and all were answered. The patient agreed with the plan and demonstrated an understanding of the instructions.   The patient was advised to call back or seek an in-person evaluation if the symptoms worsen or if the condition fails to improve as anticipated.  I provided 30 minutes of non-face-to-face time during this encounter.   THERAPIST PROGRESS NOTE  Session Time: 2:00 PM to 2:30 PM  Participation Level: Active  Behavioral Response: CasualAlertEuthymic  Type of Therapy: Individual Therapy  Treatment Goals addressed:  PTSD, coping, Interventions: Solution Focused, Strength-based, Supportive and Other: coping  Summary: Joan [name] is a 59 y.o. female who presents with had an MRI with neurologist and have to go back soon to review plan of care. Shares that she uses a pain relieving patch she puts on her neck and big help and takes off the edge. Depends where hurting as to how extensive the pain is. If it is the middle of neck goes to the spine to lower back. Starts to spread and outward and where shoulders come into play. Pain shoots from back of neck to head. When walking can aggrevate the back of neck calls it the "wing bone" scapula bone. Learning to sit more and get up to do more  when ready. Discussed having Virtigo. Found out a body position to help the spinning. Patient has been feeling good besides neck. Have to pay upfront smoking exercises so can't afford that.  Shares she does not go anywhere, has nowhere to go.  Explored this with patient and she is interested in going to a gym for senior citizens doing mild exercise.  Has insurance through Medicare but does not pay for classes.  Had to end session because started late due to technical difficulties.  Therapist reviewed symptoms, facilitated expression of thoughts and feelings reviewed strategies for mood enhancing activities.  Identified neck pain is priority right now to be addressed.  Provided supportive and positive feedback the progress was being made in addressing this issue.  Worked on Child psychotherapist.  Therapist provided active listening open questions supportive interventions.     Suicidal/Homicidal: No  Plan: Return again in 2 weeks.2.Therapist work with patient on grief continuing for her to process feelings, PTSD and coping  Diagnosis: Axis I:   PTSD, hallucinations, GAD, Mood Disorder in conditions classified elsewhere    Axis II: No diagnosis    [name], LCSW 12/28/2019

## 2019-12-28 NOTE — Addendum Note (Signed)
Addended by: [name] A on: 12/28/2019 03:08 PM   Modules accepted: Level of Service

## 2019-12-29 ENCOUNTER — Telehealth: Payer: Self-pay | Admitting: Pharmacist

## 2019-12-29 NOTE — Progress Notes (Addendum)
Chronic Care Management Pharmacy Assistant   Name: Joan [name]  MRN: [id number] DOB: [birth year]  Reason for Encounter: Disease State- COPD Adherence Call  Patient Questions: 1.  Have you seen any other providers since your last visit?  10/21/19 Dr Ernst Bowler (Allergy) 11/24/19 Dr Ronnald Ramp OV 12/10/19 Dr Elsworth Soho (pulmonary)   2.  Any changes in your medicines or health?  Yes, patient states that she takes 2 puffs of her combivent inhaler   PCP : [name], MD  Allergies:   Allergies  Allergen Reactions  . Keflex [Cephalexin] Rash and Other (See Comments)    Has tolerated amoxicillin since had keflex  . Prednisone Other (See Comments)    "counter reacts"  . Shellfish Allergy Shortness Of Breath, Nausea And Vomiting and Other (See Comments)    Stomach cramps  . Tetracyclines & Related Anaphylaxis, Swelling and Other (See Comments)    Throat swelling requiring hospitalization  . Dilaudid [Hydromorphone Hcl] Other (See Comments)    "Lethargy"  . Incruse Ellipta [Umeclidinium Bromide] Nausea Only  . Tuberculin Tests Other (See Comments)    False postive    Medications: Outpatient Encounter Medications as of 12/29/2019  Medication Sig  . acetaminophen (TYLENOL) 500 MG tablet Take 2 tablets (1,000 mg total) by mouth every 8 (eight) hours as needed for mild pain.  Marland Kitchen acyclovir (ZOVIRAX) 400 MG tablet TAKE 1 TABLET BY MOUTH  TWICE DAILY  . albuterol (PROVENTIL) (2.5 MG/3ML) 0.083% nebulizer solution Take 3 mLs (2.5 mg total) by nebulization 2 (two) times daily. And every 6 hours as needed.  DX: J44.9  . atenolol (TENORMIN) 25 MG tablet TAKE 1 TABLET BY MOUTH  DAILY (Patient taking differently: Take three times daily)  . atorvastatin (LIPITOR) 20 MG tablet TAKE 1 TABLET BY MOUTH  DAILY  . budesonide-formoterol (SYMBICORT) 160-4.5 MCG/ACT inhaler Inhale 2 puffs into the lungs 2 (two) times daily.  Marland Kitchen buPROPion (WELLBUTRIN XL) 300 MG 24 hr tablet Take 300 mg by mouth daily.   . cyclobenzaprine (FLEXERIL) 10 MG tablet TAKE 1 TABLET BY MOUTH AT  BEDTIME  . hydrochlorothiazide (HYDRODIURIL) 25 MG tablet TAKE 1 TABLET BY MOUTH  DAILY  . Ipratropium-Albuterol (COMBIVENT RESPIMAT) 20-100 MCG/ACT AERS respimat Inhale 1 puff into the lungs every 6 (six) hours as needed for wheezing. (Patient taking differently: Inhale 1 puff into the lungs in the morning and at bedtime. Take additional puff if needed at bedtime)  . levothyroxine (SYNTHROID) 25 MCG tablet TAKE 1 TABLET BY MOUTH  DAILY BEFORE BREAKFAST  . Liniments (SALONPAS ARTHRITIS PAIN RELIEF EX) Apply 1 patch topically daily as needed (to painful sites- remove as directed).   . Melatonin 10 MG TABS Take 10 mg by mouth at bedtime.  . montelukast (SINGULAIR) 10 MG tablet TAKE 1 TABLET BY MOUTH AT  BEDTIME (Patient taking differently: Take 10 mg by mouth at bedtime. )  . naproxen (NAPROSYN) 500 MG tablet Take 500 mg by mouth daily as needed for mild pain.   Marland Kitchen nystatin-triamcinolone ointment (MYCOLOG) Apply 1 application topically 2 (two) times daily as needed (applied to skin folds/groins/under breast skin irritation rash.). (Patient taking differently: Apply 1 application topically 2 (two) times daily as needed (to skin folds/groin/under breast skin- for irritation/rashes). )  . OXYGEN Inhale 2-3 L/min into the lungs See admin instructions. Inhale  2-3 L/min of oxygen into the lungs w/CPAP at bedtime and as needed for shortness of breath with "strenuous activity" during the day  . pantoprazole (PROTONIX) 40  MG tablet TAKE 1 TABLET BY MOUTH  DAILY  . PATADAY 0.2 % SOLN Apply 1 drop to eye daily.  . polyethylene glycol (MIRALAX / GLYCOLAX) 17 g packet Take 17 g by mouth daily as needed for mild constipation.  . pregabalin (LYRICA) 75 MG capsule Take 1 capsule (75 mg total) by mouth 2 (two) times daily. (Patient taking differently: Take 150 mg by mouth at bedtime. )  . QUEtiapine (SEROQUEL) 50 MG tablet TAKE 1 TABLET BY MOUTH AT   BEDTIME  . roflumilast (DALIRESP) 500 MCG TABS tablet Take 1 tablet (500 mcg total) by mouth daily.  . traZODone (DESYREL) 100 MG tablet TAKE 1 TABLET BY MOUTH AT  BEDTIME (Patient taking differently: Take 100 mg by mouth at bedtime. )  . venlafaxine XR (EFFEXOR-XR) 150 MG 24 hr capsule TAKE 1 CAPSULE BY MOUTH  DAILY WITH BREAKFAST (Patient taking differently: Take 150 mg by mouth daily. )   No facility-administered encounter medications on file as of 12/29/2019.    Current Diagnosis: Patient Active Problem List   Diagnosis Date Noted  . Type II diabetes mellitus with manifestations ([place]) 10/29/2019  . Coarse tremors 10/28/2019  . Chronic rhinitis 10/21/2019  . Allergic rhinitis 06/16/2019  . COPD (chronic obstructive pulmonary disease) ([place]) 03/30/2019  . Cervical spondylosis without myelopathy 12/18/2018  . Hypertension   . Tobacco abuse 01/15/2018  . Hypomagnesemia 12/29/2017  . Pulmonary nodule 11/11/2017  . Carotid artery disease ([place]) 11/05/2017  . OSA on CPAP 08/07/2017  . Gastroesophageal reflux disease 06/23/2017  . Prediabetes 04/05/2017  . Hypothyroidism due to acquired atrophy of thyroid 04/05/2017  . Primary osteoarthritis involving multiple joints 04/05/2017  . Chronic constipation 02/10/2017  . Iron deficiency anemia due to dietary causes 02/10/2017  . Osteoarthritis 02/10/2017  . Dyslipidemia 02/10/2017  . Chronic respiratory failure with hypoxia ([place]) 12/16/2016  . Essential hypertension 11/29/2016  . Vocal cord dysfunction   . COPD, severe ([place]) 09/24/2016  . Anxiety 09/24/2016  . Steroid-induced diabetes ([place]) 09/24/2016   . Current COPD regimen: patient uses Symbicort 160-4.5 mcg inhaler, Combivent Respimat, Oxygen  . No flowsheet data found. . Any recent hospitalizations or ED visits since last visit with CPP? No         What recent interventions/DTPs have been made by any provider to improve breathing since last visit: Yes, patient states that she gets  assistance for her all her medications  . Have you had exacerbation/flare-up since last visit? No, but patient does get sob  . What do you do when you are short of breath?  Patient states that when she gets sob she sits for a while before she takes her inhaler to help with her breathing  Respiratory Devices/Equipment . Do you have a nebulizer? Yes, Albuterol solution . Do you use a Peak Flow Meter? Yes  . Do you use a maintenance inhaler? Symbicort . How often do you forget to use your daily inhaler? No . Do you use a rescue inhaler? Combivent Respimat . How often do you use your rescue inhaler? Yes  . Do you use a spacer with your inhaler? Yes, patient also states that she needs a new one  Adherence Review: . Does the patient have >5 day gap between last estimated fill date for maintenance inhaler medications? No  Follow-Up:  Pharmacist Review    Wendy Poet, CPA (Clinical Pharmacist Assistant)

## 2020-01-03 DIAGNOSIS — R030 Elevated blood-pressure reading, without diagnosis of hypertension: Secondary | ICD-10-CM | POA: Diagnosis not present

## 2020-01-03 DIAGNOSIS — M542 Cervicalgia: Secondary | ICD-10-CM | POA: Diagnosis not present

## 2020-01-03 DIAGNOSIS — M47812 Spondylosis without myelopathy or radiculopathy, cervical region: Secondary | ICD-10-CM | POA: Diagnosis not present

## 2020-01-04 ENCOUNTER — Other Ambulatory Visit: Payer: Self-pay | Admitting: Internal Medicine

## 2020-01-09 DIAGNOSIS — J449 Chronic obstructive pulmonary disease, unspecified: Secondary | ICD-10-CM | POA: Diagnosis not present

## 2020-01-09 DIAGNOSIS — J441 Chronic obstructive pulmonary disease with (acute) exacerbation: Secondary | ICD-10-CM | POA: Diagnosis not present

## 2020-01-09 DIAGNOSIS — J9611 Chronic respiratory failure with hypoxia: Secondary | ICD-10-CM | POA: Diagnosis not present

## 2020-01-11 ENCOUNTER — Encounter (HOSPITAL_COMMUNITY): Payer: Self-pay | Admitting: Psychiatry

## 2020-01-11 ENCOUNTER — Telehealth (INDEPENDENT_AMBULATORY_CARE_PROVIDER_SITE_OTHER): Payer: Medicare Other | Admitting: Psychiatry

## 2020-01-11 DIAGNOSIS — F0630 Mood disorder due to known physiological condition, unspecified: Secondary | ICD-10-CM | POA: Diagnosis not present

## 2020-01-11 DIAGNOSIS — F4312 Post-traumatic stress disorder, chronic: Secondary | ICD-10-CM | POA: Diagnosis not present

## 2020-01-11 DIAGNOSIS — F411 Generalized anxiety disorder: Secondary | ICD-10-CM | POA: Diagnosis not present

## 2020-01-11 MED ORDER — QUETIAPINE FUMARATE 50 MG PO TABS
50.0000 mg | ORAL_TABLET | Freq: Every day | ORAL | 2 refills | Status: DC
Start: 2020-01-11 — End: 2020-03-23

## 2020-01-11 NOTE — Progress Notes (Signed)
[place] Follow up visit  Patient Identification: Joan [name] MRN:  [id number] Date of Evaluation:  01/11/2020 Referral Source:Therapist [name] Chief Complaint:    depression follow up  Visit Diagnosis:    ICD-10-CM   1. Mood disorder in conditions classified elsewhere  F06.30   2. GAD (generalized anxiety disorder)  F41.1   3. Post-traumatic stress disorder, chronic  F43.12       I connected with Joan [name] on 01/11/20 at  3:00 PM EST by telephone and verified that I am speaking with the correct person using two identifiers.       Patient location : home via tele .  Video didn't work Provider location : home   I discussed the limitations of evaluation and management by telemedicine and the availability of in person appointments. The patient expressed understanding and agreed to proceed.  History of Present Illness: Patient is a 59  years old currently widow Caucasian female referred by her therapist for management of mood symptoms, depression possible PTSD and anxiety  Feeling fair, didn't endorse worries related to hallucinations Gets forgetful , discussed meds and its effect, to taper down some meds Wakes up late, so discussed to wake earlier so sleep is not disturbed at night and may not need too many sedating meds  She is on seroquel, lyrica, trazadone, melatonin Also on lyrica would be helping additionally as mood stabilizer explained  Aggravating factor; difficult childhood, Grief, difficult relationship with middle kid, hip replacement Modifying factor:siblings, but still has difficult time with them   Duration adult life Severity of depression: denies hopelessness       Past Psychiatric History: depression, drug use  Previous Psychotropic Medications: Yes   Substance Abuse History in the last 12 months:  No.  Consequences of Substance Abuse: history of extensive drug use uptil 8 years ago and have been in rehab programs  Past Medical  History:  Past Medical History:  Diagnosis Date  . Acute respiratory failure with hypoxia ([place])   . Alcoholism and drug addiction in family   . Anxiety   . Appendicitis   . Arthritis    "knees, hips, lower back" (09/25/2016)  . Asthma   . Bipolar disorder ([place])   . Chronic bronchitis ([place])    "all the time" (09/25/2016)  . Chronic leg pain    RLE  . Chronic lower back pain   . COPD (chronic obstructive pulmonary disease) ([place])   . Depression   . Diabetes mellitus without complication ([place])   . Diabetic neuropathy ([place])   . Early cataracts, bilateral   . Emphysema of lung ([place])   . GERD (gastroesophageal reflux disease)   . Headache   . Hepatitis C    "treated back in 2001-2002"  . High cholesterol   . Hypertension   . Hypothyroidism   . Incarcerated ventral hernia 04/04/2017  . On home oxygen therapy    "2L; at nighttime" (04/04/2017)  . OSA on CPAP    CPAP with Oxygen 2 liters  . Paranoid ([place])   . Pneumonia    "all the time" (09/25/2016)  . Positive PPD    with negative chest x ray  . Tachycardia    patient reports with exertion ; denies any other conditions   . Thyroid disease   . Wears glasses   . Wears partial dentures     Past Surgical History:  Procedure Laterality Date  . BACK SURGERY    . BIOPSY  11/24/2018   Procedure:  BIOPSY;  Surgeon: Thornton Park, MD;  Location: [facility];  Service: Gastroenterology;;  . CARDIAC CATHETERIZATION    . COLONOSCOPY    . COLONOSCOPY WITH PROPOFOL N/A 11/24/2018   Procedure: COLONOSCOPY WITH PROPOFOL;  Surgeon: Thornton Park, MD;  Location: [facility];  Service: Gastroenterology;  Laterality: N/A;  . CYST EXCISION     removal of cyst in sinuses  . DILATION AND CURETTAGE OF UTERUS    . ESOPHAGOGASTRODUODENOSCOPY (EGD) WITH PROPOFOL N/A 11/24/2018   Procedure: ESOPHAGOGASTRODUODENOSCOPY (EGD) WITH PROPOFOL;  Surgeon: Thornton Park, MD;  Location: [facility];  Service: Gastroenterology;  Laterality: N/A;  . FOOT  SURGERY Bilateral    bone removed from 4th and 5 th toes and put in pin then took pin out  . HERNIA REPAIR    . INCISION AND DRAINAGE  ~ 2014/2015   "removed mesh & infection"  . INCISIONAL HERNIA REPAIR N/A 04/04/2017   Procedure: LAPAROSCOPIC INCISIONAL HERNIA REPAIR WITH MESH;  Surgeon: Fanny Skates, MD;  Location: [facility];  Service: General;  Laterality: N/A;  . LACERATION REPAIR Right    repair wrist artery from laceration from ice skate  . LAPAROSCOPIC APPENDECTOMY N/A 03/31/2019   Procedure: APPENDECTOMY LAPAROSCOPIC;  Surgeon: Ralene Ok, MD;  Location: [facility];  Service: General;  Laterality: N/A;  . LAPAROSCOPIC APPENDECTOMY N/A 07/09/2019   Procedure: APPENDECTOMY LAPAROSCOPIC;  Surgeon: Ralene Ok, MD;  Location: [facility];  Service: General;  Laterality: N/A;  . LAPAROSCOPIC INCISIONAL / UMBILICAL / [place]  04/04/2017   LAPAROSCOPIC INCISIONAL HERNIA REPAIR WITH MESH  . LIPOMA EXCISION Right    fattty lipoma in neck  . NASAL SEPTUM SURGERY    . POSTERIOR LUMBAR FUSION  2015/2016 X 2   "added rods and screws"  . SINOSCOPY    . TOTAL HIP ARTHROPLASTY Right 02/05/2017   Procedure: RIGHT TOTAL HIP ARTHROPLASTY;  Surgeon: [name], MD;  Location: [facility];  Service: Orthopedics;  Laterality: Right;  . TOTAL HIP ARTHROPLASTY Left 06/18/2017   Procedure: LEFT TOTAL HIP ARTHROPLASTY;  Surgeon: [name], MD;  Location: [facility];  Service: Orthopedics;  Laterality: Left;  . TOTAL HIP REVISION Left 01/26/2019   Procedure: TOTAL HIP REVISION;  Surgeon: Paralee Cancel, MD;  Location: [facility];  Service: Orthopedics;  Laterality: Left;  2 hrs  . TUBAL LIGATION    . UMBILICAL HERNIA REPAIR  ~ 2013   w/mesh  . UPPER GI ENDOSCOPY      Family Psychiatric History: FAther : alcohol use  Family History:  Family History  Problem Relation Age of Onset  . Diabetes Mother   . Hypertension Mother   . Hypertension Father   . Cancer Father        lung    Social  History:   Social History   Socioeconomic History  . Marital status: Widowed    Spouse name: Not on file  . Number of children: 3  . Years of education: Not on file  . Highest education level: Not on file  Occupational History  . Not on file  Tobacco Use  . Smoking status: Former Smoker    Packs/day: 1.00    Years: 44.00    Pack years: 44.00    Types: Cigarettes    Quit date: 11/30/2018    Years since quitting: 1.1  . Smokeless tobacco: Never Used  . Tobacco comment: Smoker 1-1.5 PPD every day, 11-20-2018 "i got myself down to half a pack", i am gonna be going on chantix  on the 28th"  Vaping Use  . Vaping Use: Never used  Substance and Sexual Activity  . Alcohol use: Not Currently  . Drug use: Not Currently    Types: "Crack" cocaine    Comment:  "nothing since 01/12/2012"  . Sexual activity: Not Currently    Partners: Male  Other Topics Concern  . Not on file  Social History Narrative  . Not on file   Social Determinants of Health   Financial Resource Strain: Low Risk   . Difficulty of Paying Living Expenses: Not very hard  Food Insecurity:   . Worried About Charity fundraiser in the Last Year: Not on file  . Ran Out of Food in the Last Year: Not on file  Transportation Needs:   . Lack of Transportation (Medical): Not on file  . Lack of Transportation (Non-Medical): Not on file  Physical Activity:   . Days of Exercise per Week: Not on file  . Minutes of Exercise per Session: Not on file  Stress:   . Feeling of Stress : Not on file  Social Connections:   . Frequency of Communication with Friends and Family: Not on file  . Frequency of Social Gatherings with Friends and Family: Not on file  . Attends Religious Services: Not on file  . Active Member of Clubs or Organizations: Not on file  . Attends Archivist Meetings: Not on file  . Marital Status: Not on file      Allergies:   Allergies  Allergen Reactions  . Keflex [Cephalexin] Rash and Other  (See Comments)    Has tolerated amoxicillin since had keflex  . Prednisone Other (See Comments)    "counter reacts"  . Shellfish Allergy Shortness Of Breath, Nausea And Vomiting and Other (See Comments)    Stomach cramps  . Tetracyclines & Related Anaphylaxis, Swelling and Other (See Comments)    Throat swelling requiring hospitalization  . Dilaudid [Hydromorphone Hcl] Other (See Comments)    "Lethargy"  . Incruse Ellipta [Umeclidinium Bromide] Nausea Only  . Tuberculin Tests Other (See Comments)    False postive    Metabolic Disorder Labs: Lab Results  Component Value Date   HGBA1C 6.7 (H) 10/28/2019   MPG 146 10/28/2019   MPG 131.24 03/31/2019   No results found for: PROLACTIN Lab Results  Component Value Date   CHOL 125 12/17/2018   TRIG 261.0 (H) 12/17/2018   HDL 37.80 (L) 12/17/2018   CHOLHDL 3 12/17/2018   VLDL 52.2 (H) 12/17/2018   LDLCALC 62 08/19/2017   Lab Results  Component Value Date   TSH 0.89 10/28/2019    Therapeutic Level Labs: No results found for: LITHIUM No results found for: CBMZ No results found for: VALPROATE  Current Medications: Current Outpatient Medications  Medication Sig Dispense Refill  . acetaminophen (TYLENOL) 500 MG tablet Take 2 tablets (1,000 mg total) by mouth every 8 (eight) hours as needed for mild pain. 30 tablet 0  . acyclovir (ZOVIRAX) 400 MG tablet TAKE 1 TABLET BY MOUTH  TWICE DAILY 180 tablet 1  . albuterol (PROVENTIL) (2.5 MG/3ML) 0.083% nebulizer solution Take 3 mLs (2.5 mg total) by nebulization 2 (two) times daily. And every 6 hours as needed.  DX: J44.9 360 mL 3  . atenolol (TENORMIN) 25 MG tablet TAKE 1 TABLET BY MOUTH  DAILY (Patient taking differently: Take three times daily) 90 tablet 1  . atorvastatin (LIPITOR) 20 MG tablet TAKE 1 TABLET BY MOUTH  DAILY 90 tablet 1  .  budesonide-formoterol (SYMBICORT) 160-4.5 MCG/ACT inhaler Inhale 2 puffs into the lungs 2 (two) times daily. 2 Inhaler 0  . buPROPion (WELLBUTRIN  XL) 300 MG 24 hr tablet Take 300 mg by mouth daily.    . cyclobenzaprine (FLEXERIL) 10 MG tablet TAKE 1 TABLET BY MOUTH AT  BEDTIME 90 tablet 1  . hydrochlorothiazide (HYDRODIURIL) 25 MG tablet TAKE 1 TABLET BY MOUTH  DAILY 90 tablet 1  . Ipratropium-Albuterol (COMBIVENT RESPIMAT) 20-100 MCG/ACT AERS respimat Inhale 1 puff into the lungs every 6 (six) hours as needed for wheezing. (Patient taking differently: Inhale 1 puff into the lungs in the morning and at bedtime. Take additional puff if needed at bedtime) 3 Inhaler 3  . levothyroxine (SYNTHROID) 25 MCG tablet TAKE 1 TABLET BY MOUTH  DAILY BEFORE BREAKFAST 90 tablet 1  . Liniments (SALONPAS ARTHRITIS PAIN RELIEF EX) Apply 1 patch topically daily as needed (to painful sites- remove as directed).     . Melatonin 10 MG TABS Take 10 mg by mouth at bedtime.    . montelukast (SINGULAIR) 10 MG tablet TAKE 1 TABLET BY MOUTH AT  BEDTIME (Patient taking differently: Take 10 mg by mouth at bedtime. ) 90 tablet 3  . naproxen (NAPROSYN) 500 MG tablet Take 500 mg by mouth daily as needed for mild pain.     Marland Kitchen nystatin-triamcinolone ointment (MYCOLOG) Apply 1 application topically 2 (two) times daily as needed (applied to skin folds/groins/under breast skin irritation rash.). (Patient taking differently: Apply 1 application topically 2 (two) times daily as needed (to skin folds/groin/under breast skin- for irritation/rashes). ) 100 g 3  . OXYGEN Inhale 2-3 L/min into the lungs See admin instructions. Inhale  2-3 L/min of oxygen into the lungs w/CPAP at bedtime and as needed for shortness of breath with "strenuous activity" during the day    . pantoprazole (PROTONIX) 40 MG tablet TAKE 1 TABLET BY MOUTH  DAILY 90 tablet 1  . PATADAY 0.2 % SOLN Apply 1 drop to eye daily.    . polyethylene glycol (MIRALAX / GLYCOLAX) 17 g packet Take 17 g by mouth daily as needed for mild constipation. 28 packet 0  . pregabalin (LYRICA) 75 MG capsule Take 1 capsule (75 mg total) by  mouth 2 (two) times daily. (Patient taking differently: Take 150 mg by mouth at bedtime. ) 180 capsule 1  . QUEtiapine (SEROQUEL) 50 MG tablet Take 1 tablet (50 mg total) by mouth at bedtime. 30 tablet 2  . roflumilast (DALIRESP) 500 MCG TABS tablet Take 1 tablet (500 mcg total) by mouth daily. 90 tablet 3  . traZODone (DESYREL) 100 MG tablet TAKE 1 TABLET BY MOUTH AT  BEDTIME (Patient taking differently: Take 100 mg by mouth at bedtime. ) 90 tablet 3  . venlafaxine XR (EFFEXOR-XR) 150 MG 24 hr capsule TAKE 1 CAPSULE BY MOUTH  DAILY WITH BREAKFAST (Patient taking differently: Take 150 mg by mouth daily. ) 90 capsule 3   No current facility-administered medications for this visit.     Psychiatric Specialty Exam: Review of Systems  Cardiovascular: Negative for chest pain.  Psychiatric/Behavioral: Negative for substance abuse and suicidal ideas.    There were no vitals taken for this visit.There is no height or weight on file to calculate BMI.  General Appearance:casual  Eye Contact:  fair  Speech:  Slow  Volume:  Decreased  Mood:fair  Affect:  Congruent  Thought Process:  Goal Directed  Orientation:  Full (Time, Place, and Person)  Thought Content:  rational  Suicidal Thoughts:  No  Homicidal Thoughts:  No  Memory:  Immediate;   Fair Recent;   Fair  Judgement:  Fair  Insight:  Shallow  Psychomotor Activity:  Decreased  Concentration:  Concentration: Fair and Attention Span: Fair  Recall:  [employer] of Knowledge:Fair  Language: Fair  Akathisia:  No  Handed:    AIMS (if indicated): no involuntary movements  Assets:  Desire for Improvement Leisure Time Social Support  ADL's: limited due to recent surgery  Cognition: WNL  Sleep:  variable   Screenings: Mini-Mental     Clinical Support from 04/06/2018 in [facility]  Total Score (max 30 points ) 29    PHQ2-9     Clinical Support from 05/24/2019 in [place] at [facility] Patient  Outreach Telephone from 12/18/2018 in [facility] Patient Outreach Telephone from 11/18/2018 in [facility] from 04/06/2018 in [facility] [facility] Visit from 11/28/2016 in [facility]  PHQ-2 Total Score 2 3 1 2 1   PHQ-9 Total Score 13 -- -- 6 --      Assessment and Plan: as follows  Mood disorder unspecified: relavant to multiple medical concerns and past history of using drugs, alcohol  Remains sober for now, discussed drugs and meds effect on forgetfullness, she is willing to taper down melatonin and work on sleep hygiene, continue seroquel Continue therapy for mood   Hallucinations may be part of Grief, PTSD or depression or possible part of schizoaffective disorder. Continue seroquel  Also takes trazadone if needed Risk side effects discussed Continue therapy to work on conflicts and mood     I discussed the assessment and treatment plan with the patient. The patient was provided an opportunity to ask questions and all were answered. The patient agreed with the plan and demonstrated an understanding of the instructions.   The patient was advised to call back or seek an in-person evaluation if the symptoms worsen or if the condition fails to improve as anticipated. Fu 8 weeks,  Non face to face time spent: 18min   [name], MD 11/16/20213:19 PM

## 2020-01-12 ENCOUNTER — Ambulatory Visit (INDEPENDENT_AMBULATORY_CARE_PROVIDER_SITE_OTHER): Payer: Medicare Other | Admitting: Licensed Clinical Social Worker

## 2020-01-12 DIAGNOSIS — F0630 Mood disorder due to known physiological condition, unspecified: Secondary | ICD-10-CM | POA: Diagnosis not present

## 2020-01-12 DIAGNOSIS — F411 Generalized anxiety disorder: Secondary | ICD-10-CM | POA: Diagnosis not present

## 2020-01-12 DIAGNOSIS — F4312 Post-traumatic stress disorder, chronic: Secondary | ICD-10-CM | POA: Diagnosis not present

## 2020-01-12 DIAGNOSIS — R443 Hallucinations, unspecified: Secondary | ICD-10-CM | POA: Diagnosis not present
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
## 2020-01-17 NOTE — Telephone Encounter (Signed)
Patient calling for status of refill  

## 2020-01-25 ENCOUNTER — Ambulatory Visit (INDEPENDENT_AMBULATORY_CARE_PROVIDER_SITE_OTHER): Payer: Medicare Other | Admitting: Licensed Clinical Social Worker

## 2020-01-25 ENCOUNTER — Other Ambulatory Visit: Payer: Self-pay | Admitting: Internal Medicine

## 2020-01-25 DIAGNOSIS — F0630 Mood disorder due to known physiological condition, unspecified: Secondary | ICD-10-CM | POA: Diagnosis not present

## 2020-01-25 DIAGNOSIS — F4312 Post-traumatic stress disorder, chronic: Secondary | ICD-10-CM

## 2020-01-25 DIAGNOSIS — F411 Generalized anxiety disorder: Secondary | ICD-10-CM

## 2020-01-25 DIAGNOSIS — R443 Hallucinations, unspecified: Secondary | ICD-10-CM | POA: Diagnosis not present
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
## 2020-01-26 DIAGNOSIS — I10 Essential (primary) hypertension: Secondary | ICD-10-CM | POA: Diagnosis not present

## 2020-01-26 DIAGNOSIS — M47812 Spondylosis without myelopathy or radiculopathy, cervical region: Secondary | ICD-10-CM | POA: Diagnosis not present

## 2020-01-27 ENCOUNTER — Ambulatory Visit: Payer: Medicare Other | Admitting: Allergy & Immunology

## 2020-02-01 ENCOUNTER — Telehealth: Payer: Medicare Other

## 2020-02-01 NOTE — Chronic Care Management (AMB) (Deleted)
Chronic Care Management Pharmacy  Name: Joan [name]  MRN: [id number] DOB: June 24, 1960  Chief Complaint/ HPI  Joan [name],  59 y.o. , female presents for their Follow-Up CCM visit with the clinical pharmacist via telephone due to COVID-19 Pandemic.  PCP : [name], MD  Their chronic conditions include: HTN, HLD COPD, GERD, DM, hypothyroidism, arthritis, anxiety/depression  Office Visits: 10/28/19 Dr Ronnald Ramp OV: f/u CT scan, neck/back pain, confusion. Referred to neurology for tremors  06/16/19 Dr Sharlet Salina OV: head pain, allergy, nerve pain. Referred to allergy for possible allergy shots. Ordered CT head/neck. Rx'd Lyrica 75 mg BID. Still needs repeat surgery for appendicitis.   04/29/19 Dr Sharlet Salina OV: hospital f/u (appendecitis, unsuccessful appendectomy). Constipation worse, has f/u with surgeon.  Consult Visit: 01/11/20 Dr De Nurse (psych): no med changes 12/10/19 Dr Elsworth Soho (pulmonary): f/u COPD, OSA. Continue Symbicort, Combivent. May consider Breztri in future. 10/11/19 Dr Benjamine Sprague (behavioral health): continue meds and therapy for grief, PTSD. Seeing counselor every 2 weeks.  08/09/19 Dr Benjamine Sprague (behavioral health): continue meds, therapy.  07/19/19 Dr De Nurse (behavioral health): increased seroquel to 50 mg.  07/09/19 Hospital admission: appendectomy   05/11/19 Dr Dellis Filbert (OB/GYN): normal PAP. Continue Vitamin D and calcium, schedule BMD.   05/10/19 Dr De Nurse (psych): pt experiencing hallucinations, may be grief, PTSD, schizoaffective. Reduced seroquel to 25 mg at pt request.   05/05/19 ED visit: chest pain, workup WNL, GI cocktail improved pain.  03/30/19-04/14/19 hospital admission: acute appendicitis w/ ? Perforation  03/17/19 NP Groce (pulmonary): decrease Daliresp to 250 mcg daily, monitor LFTs. Quit smoking with Chantix.   02/13/19 ED visit: dislocated L hip.   Medications: Outpatient Encounter Medications as of 02/01/2020  Medication Sig  . naproxen  (NAPROSYN) 500 MG tablet TAKE 1 TABLET BY MOUTH  TWICE DAILY WITH A MEAL  . traZODone (DESYREL) 100 MG tablet TAKE 1 TABLET BY MOUTH AT  BEDTIME  . venlafaxine XR (EFFEXOR-XR) 150 MG 24 hr capsule TAKE 1 CAPSULE BY MOUTH  DAILY WITH BREAKFAST  . acetaminophen (TYLENOL) 500 MG tablet Take 2 tablets (1,000 mg total) by mouth every 8 (eight) hours as needed for mild pain.  Marland Kitchen acyclovir (ZOVIRAX) 400 MG tablet TAKE 1 TABLET BY MOUTH  TWICE DAILY  . albuterol (PROVENTIL) (2.5 MG/3ML) 0.083% nebulizer solution Take 3 mLs (2.5 mg total) by nebulization 2 (two) times daily. And every 6 hours as needed.  DX: J44.9  . atenolol (TENORMIN) 25 MG tablet TAKE 1 TABLET BY MOUTH  DAILY (Patient taking differently: Take three times daily)  . atorvastatin (LIPITOR) 20 MG tablet TAKE 1 TABLET BY MOUTH  DAILY  . budesonide-formoterol (SYMBICORT) 160-4.5 MCG/ACT inhaler Inhale 2 puffs into the lungs 2 (two) times daily.  Marland Kitchen buPROPion (WELLBUTRIN XL) 300 MG 24 hr tablet Take 300 mg by mouth daily.  . cyclobenzaprine (FLEXERIL) 10 MG tablet TAKE 1 TABLET BY MOUTH AT  BEDTIME  . hydrochlorothiazide (HYDRODIURIL) 25 MG tablet TAKE 1 TABLET BY MOUTH  DAILY  . Ipratropium-Albuterol (COMBIVENT RESPIMAT) 20-100 MCG/ACT AERS respimat Inhale 1 puff into the lungs every 6 (six) hours as needed for wheezing. (Patient taking differently: Inhale 1 puff into the lungs in the morning and at bedtime. Take additional puff if needed at bedtime)  . levothyroxine (SYNTHROID) 25 MCG tablet TAKE 1 TABLET BY MOUTH  DAILY BEFORE BREAKFAST  . Liniments (SALONPAS ARTHRITIS PAIN RELIEF EX) Apply 1 patch topically daily as needed (to painful sites- remove as directed).   . Melatonin 10 MG  TABS Take 10 mg by mouth at bedtime.  . montelukast (SINGULAIR) 10 MG tablet TAKE 1 TABLET BY MOUTH AT  BEDTIME (Patient taking differently: Take 10 mg by mouth at bedtime. )  . nystatin-triamcinolone ointment (MYCOLOG) Apply 1 application topically 2 (two) times  daily as needed (applied to skin folds/groins/under breast skin irritation rash.). (Patient taking differently: Apply 1 application topically 2 (two) times daily as needed (to skin folds/groin/under breast skin- for irritation/rashes). )  . OXYGEN Inhale 2-3 L/min into the lungs See admin instructions. Inhale  2-3 L/min of oxygen into the lungs w/CPAP at bedtime and as needed for shortness of breath with "strenuous activity" during the day  . pantoprazole (PROTONIX) 40 MG tablet TAKE 1 TABLET BY MOUTH  DAILY  . PATADAY 0.2 % SOLN Apply 1 drop to eye daily.  . polyethylene glycol (MIRALAX / GLYCOLAX) 17 g packet Take 17 g by mouth daily as needed for mild constipation.  . pregabalin (LYRICA) 75 MG capsule Take 1 capsule (75 mg total) by mouth 2 (two) times daily. (Patient taking differently: Take 150 mg by mouth at bedtime. )  . QUEtiapine (SEROQUEL) 50 MG tablet Take 1 tablet (50 mg total) by mouth at bedtime.  . roflumilast (DALIRESP) 500 MCG TABS tablet Take 1 tablet (500 mcg total) by mouth daily.   No facility-administered encounter medications on file as of 02/01/2020.    Current Diagnosis/Assessment:    Goals Addressed   None    COPD   Last spirometry score: FEV1 43% predicted, FEV1/FVC 0.47  Gold Grade: Gold 3 (FEV1 30-49%) Current COPD Classification:  D (high sx, >/=2 exacerbations/yr)  Tobacco Status:  Social History   Tobacco Use  Smoking Status Former Smoker  . Packs/day: 1.00  . Years: 44.00  . Pack years: 44.00  . Types: Cigarettes  . Quit date: 11/30/2018  . Years since quitting: 1.1  Smokeless Tobacco Never Used  Tobacco Comment   Smoker 1-1.5 PPD every day, 11-20-2018 "i got myself down to half a pack", i am gonna be going on chantix on the 28th"    Patient has failed these meds in past: Atrovent, Duoneb, Xoponex, Dulera, Spiriva, Incruse Patient is currently controlled on the following medications:   Symbicort 160/4.5 - 2 puff BID (AZ & Me)  Combivent 1  puff q6h prn (BI Cares)  roflumilast 500 mcg daily (AZ & Me)  montelukast 10 mg HS  albuterol nebs  Using maintenance inhaler regularly? Yes Frequency of rescue inhaler use:  daily  We discussed:  proper inhaler technique; pt reports more breathing difficulties in the AM, she is in the process of replacing her CPAP equipment due to a recall. She is also turning up her O2 to 3L overnight (normally 2 L during the day)  Plan  Continue current medications  Follow up with pulmonology   Hypertension   BP goal < 130/80  Office blood pressures are  BP Readings from Last 3 Encounters:  12/10/19 126/74  10/28/19 116/72  10/21/19 132/82   Patient has failed these meds in the past: metoprolol Patient is currently controlled on the following medications:   atenolol 25 mg daily,  HCTZ 25 mg daily  Patient checks BP at home infrequently  Patient home BP readings are ranging: n/a  We discussed diet and exercise extensively, BP goals, importance of compliance with meds and monitoring BP at home.  Plan  Continue current medications and control with diet and exercise    Hyperlipidemia   LDL  goal < 100  Lipid Panel     Component Value Date/Time   CHOL 125 12/17/2018 1402   CHOL 134 08/19/2017 0822   TRIG 261.0 (H) 12/17/2018 1402   HDL 37.80 (L) 12/17/2018 1402   HDL 43 08/19/2017 0822   CHOLHDL 3 12/17/2018 1402   VLDL 52.2 (H) 12/17/2018 1402   LDLCALC 62 08/19/2017 0822   LDLDIRECT 56.0 12/17/2018 1402   LABVLDL 29 08/19/2017 0822     The ASCVD Risk score (Goff DC Jr., et al., 2013) failed to calculate for the following reasons:   The valid total cholesterol range is 130 to 320 mg/dL   Patient has failed these meds in past: n/a Patient is currently controlled on the following medications:   atorvastatin 20 mg daily  We discussed:  Cholesterol goals, benefits of statins, diet changes to improve triglycerides.  Plan  Continue current medications and control  with diet and exercise   Chronic pain   Back pain Cervicalgia L Hip pain  Patient has failed these meds in past: gabapentin Patient is currently controlled on the following medications:   Tylenol 1000 mg q8h prn,   naproxen 500 mg BID prn,   Pregabalin 75 mg BID  cyclobenzaprine 10 mg HS,   Salonpas patch  We discussed:  Pt reports pain is worse lately; she does still get relief from above regimen; recommended f/u with PCP if pain worsens  Plan  Continue current medications   Anxiety/depression   Depression screen [facility] 2/9 05/24/2019 12/18/2018 11/18/2018  Decreased Interest 2 3 1   Down, Depressed, Hopeless 0 0 0  PHQ - 2 Score 2 3 1   Altered sleeping 1 - -  Tired, decreased energy 1 - -  Change in appetite 3 - -  Feeling bad or failure about yourself  0 - -  Trouble concentrating 3 - -  Moving slowly or fidgety/restless 3 - -  Suicidal thoughts 0 - -  PHQ-9 Score 13 - -  Difficult doing work/chores Very difficult - -  Some recent data might be hidden    Patient has failed these meds in past: Abilify,  Patient is currently controlled on the following medications:   bupropion 300 mg  venlafaxine 150 mg qAM   quetiapine 50 mg HS  trazodone 100 mg HS  We discussed: PT reports worsening memory problems, she will stop in the middle of a task and forget what she was doing. She has discussed with her psychiatrist previously who increased quetiapine dose. Pt does not report any improvement since increasing dose. Encouraged her to follow up with psychiatrist regarding this issue.  Plan  Continue current medications  Follow up with psychiatrist regarding memory issues  Medication Management   Pt [facility] mail order and [facility] for all medications Uses pill box Pt endorses compliance  We discussed: Patient is satisfied with current medication strategy, denies going without needed medications. Encouraged her to take advantage of $40/quarter OTC  allowance through [facility].  Patient assistance: Symbicort - AZ&Me Daliresp - AZ&Me Combivent - BI Cares (approved through 03/2020)  Plan  Continue current medication management strategy   Follow up: *** month phone visit  Charlene Brooke, PharmD, BCACP Clinical Pharmacist [facility] [phone]

## 2020-02-08 ENCOUNTER — Telehealth: Payer: Self-pay | Admitting: Internal Medicine

## 2020-02-08 ENCOUNTER — Ambulatory Visit: Payer: Medicare Other | Admitting: Pharmacist

## 2020-02-08 ENCOUNTER — Other Ambulatory Visit: Payer: Self-pay

## 2020-02-08 DIAGNOSIS — J9611 Chronic respiratory failure with hypoxia: Secondary | ICD-10-CM | POA: Diagnosis not present

## 2020-02-08 DIAGNOSIS — J449 Chronic obstructive pulmonary disease, unspecified: Secondary | ICD-10-CM

## 2020-02-08 DIAGNOSIS — J441 Chronic obstructive pulmonary disease with (acute) exacerbation: Secondary | ICD-10-CM | POA: Diagnosis not present

## 2020-02-08 NOTE — Patient Instructions (Signed)
Visit Information  Phone number for Pharmacist: [phone]  Patient assistance renewals for 2022 have been completed.  The patient verbalized understanding of instructions, educational materials, and care plan provided today and declined offer to receive copy of patient instructions, educational materials, and care plan.  Telephone follow up appointment with pharmacy team member scheduled for:  Charlene Brooke, PharmD, BCACP Clinical Pharmacist [facility] [phone]

## 2020-02-08 NOTE — Telephone Encounter (Signed)
Patient called and was wondering if someone could call [facility]. She said they have been contacting her about more information for her meds.     [place], [address], [address] Phone:  [phone]  Fax:  [phone]

## 2020-02-08 NOTE — Progress Notes (Signed)
Patient presents to renew patient assistance paperwork for 2022.  Current PAP: -Combivent (BI Cares) -Daliresp, Symbicort ([place] & Me)   Reviewed application process for patient assistance programs Patient meets income/out of pocket spend criteria for the program. Patient will provide proof of income, out of pocket spend report, and will sign application. Will collaborate with prescribers Dr Sharlet Salina, Dr Elsworth Soho for the provider portion of application. Once completed, application will be submitted via Fax   Patient signed patient sections of renewal applications while in office today.   Patient also asked about status of other medications with [facility]. Pt has enough medication until late January. She has PCP appt 03/01/19 and will have medications refilled then. Also advised her to request refill for albuterol nebulizer solution since this is free through her insurance.  Pt voiced understanding of above, denies further questions  Charlene Brooke, PharmD, BCACP Clinical Pharmacist [facility] [phone]

## 2020-02-09 ENCOUNTER — Encounter (HOSPITAL_COMMUNITY): Payer: Self-pay | Admitting: Licensed Clinical Social Worker

## 2020-02-09 ENCOUNTER — Ambulatory Visit (INDEPENDENT_AMBULATORY_CARE_PROVIDER_SITE_OTHER): Payer: Medicare Other | Admitting: Licensed Clinical Social Worker

## 2020-02-09 DIAGNOSIS — F0630 Mood disorder due to known physiological condition, unspecified: Secondary | ICD-10-CM | POA: Diagnosis not present

## 2020-02-09 DIAGNOSIS — F411 Generalized anxiety disorder: Secondary | ICD-10-CM | POA: Insufficient documentation

## 2020-02-09 DIAGNOSIS — F4310 Post-traumatic stress disorder, unspecified: Secondary | ICD-10-CM | POA: Diagnosis not present

## 2020-02-09 HISTORY — DX: Mood disorder due to known physiological condition, unspecified: F06.30

## 2020-02-09 HISTORY — DX: Generalized anxiety disorder: F41.1

## 2020-02-09 HISTORY — DX: Post-traumatic stress disorder, unspecified: F43.10

## 2020-02-09 NOTE — Progress Notes (Signed)
Virtual Visit via Telephone Note  I connected with Joan [name] on 02/09/20 at  2:00 PM EST by telephone and verified that I am speaking with the correct person using two identifiers.  Location: Patient: home Provider: home office   I discussed the limitations, risks, security and privacy concerns of performing an evaluation and management service by telephone and the availability of in person appointments. I also discussed with the patient that there may be a patient responsible charge related to this service. The patient expressed understanding and agreed to proceed.  I discussed the assessment and treatment plan with the patient. The patient was provided an opportunity to ask questions and all were answered. The patient agreed with the plan and demonstrated an understanding of the instructions.   The patient was advised to call back or seek an in-person evaluation if the symptoms worsen or if the condition fails to improve as anticipated.  I provided 52 minutes of non-face-to-face time during this encounter.  THERAPIST PROGRESS NOTE  Session Time: 2:00 PM to 2:52 PM  Participation Level: Active  Behavioral Response: CasualAlertAnxious  Type of Therapy: Individual Therapy  Treatment Goals addressed:  PTSD, coping, Interventions: Solution Focused, Strength-based, Supportive and Other: coping  Summary: Joan [name] is a 59 y.o. female who presents with "things are not good over here." Looking for assisted living can't be by herself. Difficult with activities of daily living such as bathing, getting her bed made, getting dressed. Called social security and they told her to call DSS. Called her back yesterday. Got a hold of them again today. Will go from there and see what they tell her to do. Thinks her lung specialist could be doctor to help get resources in place. Needs to have people come in can't do it anymore on her own. On oxygen and there is supposed to be someone  coming  on medication. After emergency surgery for appendicitis had someone coming and stopped not sure why. Sees PCP after new year and will talk to them. Talked to surgeon for her neck and plan is for Cortizone shots through her spine. Backbone of her neck. Doctor is a Publishing rights manager. Only issue in moving to assisted living is can't have a car. Therapist explored indendepent living can't live alone anymore on oxygen all the time and even on oxygen have a hard time breathing. Moved all her stuff in bedroom. Bought a English as a second language teacher in bedroom spends all her time there. Breathing gotten worse. Found nodules which are not good. This year do testing and see what is going on with nodules. Can tell it has gotten worse. If they are growing will have to remove them. Not sure how they can do this., Catches her breath is scary feels like not going to catch her breath. Tries to relax and be calm, gasping too much trying to get air anywhere she can. She has attacks that are episodes with breathing, panting, very scary. Set un the nebulizer quick as she can hard when she feels like that. Gets through it. Has the attacks them often lately. Back to smoking. When people on her about it makes it worse and wants more cigarettes has to be something she decides to do.  Patient asked about mental health diagnoses being put on the list and after session therapist able to put on this.       Suicidal/Homicidal: No  Therapist Response: Therapist reviewed symptoms, facilitated expression of thoughts and feelings, agree with patient best strategy for her  as assisted living and problem solved around next steps that she has to take to get into assisted living.  Discussed that we will be like work and taking one thing at a time but will figure it out.  Reviewed her medical issues which are significant which indicate need for assisted living.  Discussed difficulties currently with symptoms such as difficulty breathing, validated patient how she  was feeling, guiding supportive interventions as well.  Therapist provided active listening, open questions supportive interventions. Patient asked therapist to add mental health diagnosis to problem list and therapist went ahead and did that.   Plan: Return again in 3 weeks. 2. Therapist will facilitated patient talking about past as a way to work through trauma help decrease symptoms.3.Therapist help with stress management strategies, coping  Diagnosis: Axis I:  PTSD, hallucinations, GAD, Mood Disorder in conditions classified elsewhere    Axis II: No diagnosis    [name], LCSW 02/09/2020

## 2020-02-10 ENCOUNTER — Other Ambulatory Visit: Payer: Self-pay | Admitting: *Deleted

## 2020-02-10 DIAGNOSIS — Z87891 Personal history of nicotine dependence: Secondary | ICD-10-CM

## 2020-02-10 DIAGNOSIS — F17210 Nicotine dependence, cigarettes, uncomplicated: Secondary | ICD-10-CM

## 2020-02-10 NOTE — Telephone Encounter (Signed)
traZODone (DESYREL) 100 MG tablet  venlafaxine XR (EFFEXOR-XR) 150 MG 24 hr capsule  naproxen (NAPROSYN) 500 MG tablet  pregabalin (LYRICA) 75 MG capsule  [facility] sent over a fax and it needs to be completed so that Joan [name] can get her medication

## 2020-02-10 NOTE — Telephone Encounter (Signed)
I have not seen any faxes regarding her.

## 2020-02-28 ENCOUNTER — Other Ambulatory Visit: Payer: Self-pay

## 2020-02-28 ENCOUNTER — Ambulatory Visit (INDEPENDENT_AMBULATORY_CARE_PROVIDER_SITE_OTHER): Payer: Medicare Other | Admitting: Licensed Clinical Social Worker

## 2020-02-28 DIAGNOSIS — F0630 Mood disorder due to known physiological condition, unspecified: Secondary | ICD-10-CM | POA: Diagnosis not present

## 2020-02-28 DIAGNOSIS — F4310 Post-traumatic stress disorder, unspecified: Secondary | ICD-10-CM | POA: Diagnosis not present

## 2020-02-28 DIAGNOSIS — F411 Generalized anxiety disorder: Secondary | ICD-10-CM
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
## 2020-02-29 ENCOUNTER — Encounter: Payer: Medicare Other | Admitting: Internal Medicine

## 2020-03-07 ENCOUNTER — Other Ambulatory Visit: Payer: Self-pay | Admitting: Internal Medicine

## 2020-03-07 ENCOUNTER — Ambulatory Visit (INDEPENDENT_AMBULATORY_CARE_PROVIDER_SITE_OTHER): Payer: Medicare Other | Admitting: Internal Medicine

## 2020-03-07 ENCOUNTER — Encounter: Payer: Self-pay | Admitting: Internal Medicine

## 2020-03-07 ENCOUNTER — Other Ambulatory Visit: Payer: Self-pay

## 2020-03-07 VITALS — BP 120/80 | HR 75 | Temp 98.3°F | Ht 67.0 in | Wt 214.0 lb

## 2020-03-07 DIAGNOSIS — E785 Hyperlipidemia, unspecified: Secondary | ICD-10-CM | POA: Diagnosis not present

## 2020-03-07 DIAGNOSIS — I10 Essential (primary) hypertension: Secondary | ICD-10-CM | POA: Diagnosis not present

## 2020-03-07 DIAGNOSIS — J449 Chronic obstructive pulmonary disease, unspecified: Secondary | ICD-10-CM

## 2020-03-07 DIAGNOSIS — Z1159 Encounter for screening for other viral diseases: Secondary | ICD-10-CM | POA: Diagnosis not present

## 2020-03-07 DIAGNOSIS — Z0001 Encounter for general adult medical examination with abnormal findings: Secondary | ICD-10-CM | POA: Diagnosis not present

## 2020-03-07 DIAGNOSIS — E118 Type 2 diabetes mellitus with unspecified complications: Secondary | ICD-10-CM

## 2020-03-07 DIAGNOSIS — E119 Type 2 diabetes mellitus without complications: Secondary | ICD-10-CM

## 2020-03-07 DIAGNOSIS — E1169 Type 2 diabetes mellitus with other specified complication: Secondary | ICD-10-CM | POA: Diagnosis not present

## 2020-03-07 DIAGNOSIS — I779 Disorder of arteries and arterioles, unspecified: Secondary | ICD-10-CM

## 2020-03-07 LAB — HEMOGLOBIN A1C: Hgb A1c MFr Bld: 7.2 % — ABNORMAL HIGH (ref 4.6–6.5)

## 2020-03-07 LAB — COMPREHENSIVE METABOLIC PANEL WITH GFR
ALT: 32 U/L (ref 0–35)
AST: 22 U/L (ref 0–37)
Albumin: 4.2 g/dL (ref 3.5–5.2)
Alkaline Phosphatase: 134 U/L — ABNORMAL HIGH (ref 39–117)
BUN: 15 mg/dL (ref 6–23)
CO2: 36 meq/L — ABNORMAL HIGH (ref 19–32)
Calcium: 10 mg/dL (ref 8.4–10.5)
Chloride: 97 meq/L (ref 96–112)
Creatinine, Ser: 1.1 mg/dL (ref 0.40–1.20)
GFR: 55.45 mL/min — ABNORMAL LOW (ref >60.00)
Glucose, Bld: 125 mg/dL — ABNORMAL HIGH (ref 70–99)
Potassium: 4.2 meq/L (ref 3.5–5.1)
Sodium: 138 meq/L (ref 135–145)
Total Bilirubin: 0.4 mg/dL (ref 0.2–1.2)
Total Protein: 6.9 g/dL (ref 6.0–8.3)

## 2020-03-07 LAB — CBC
HCT: 39.8 % (ref 36.0–46.0)
Hemoglobin: 13.2 g/dL (ref 12.0–15.0)
MCHC: 33.1 g/dL (ref 30.0–36.0)
MCV: 92 fl (ref 78.0–100.0)
Platelets: 295 K/uL (ref 150.0–400.0)
RBC: 4.32 Mil/uL (ref 3.87–5.11)
RDW: 15 % (ref 11.5–15.5)
WBC: 8.7 K/uL (ref 4.0–10.5)

## 2020-03-07 LAB — LIPID PANEL
Cholesterol: 141 mg/dL (ref 0–200)
HDL: 54.3 mg/dL (ref >39.00)
LDL Cholesterol: 58 mg/dL (ref 0–99)
NonHDL: 86.21
Total CHOL/HDL Ratio: 3
Triglycerides: 143 mg/dL (ref 0.0–149.0)
VLDL: 28.6 mg/dL (ref 0.0–40.0)

## 2020-03-07 NOTE — Patient Instructions (Signed)
Health Maintenance, Female Adopting a healthy lifestyle and getting preventive care are important in promoting health and wellness. Ask your health care provider about:  The right schedule for you to have regular tests and exams.  Things you can do on your own to prevent diseases and keep yourself healthy. What should I know about diet, weight, and exercise? Eat a healthy diet  Eat a diet that includes plenty of vegetables, fruits, low-fat dairy products, and lean protein.  Do not eat a lot of foods that are high in solid fats, added sugars, or sodium.   Maintain a healthy weight Body mass index (BMI) is used to identify weight problems. It estimates body fat based on height and weight. Your health care provider can help determine your BMI and help you achieve or maintain a healthy weight. Get regular exercise Get regular exercise. This is one of the most important things you can do for your health. Most adults should:  Exercise for at least 150 minutes each week. The exercise should increase your heart rate and make you sweat (moderate-intensity exercise).  Do strengthening exercises at least twice a week. This is in addition to the moderate-intensity exercise.  Spend less time sitting. Even light physical activity can be beneficial. Watch cholesterol and blood lipids Have your blood tested for lipids and cholesterol at 59 years of age, then have this test every 5 years. Have your cholesterol levels checked more often if:  Your lipid or cholesterol levels are high.  You are older than 59 years of age.  You are at high risk for heart disease. What should I know about cancer screening? Depending on your health history and family history, you may need to have cancer screening at various ages. This may include screening for:  Breast cancer.  Cervical cancer.  Colorectal cancer.  Skin cancer.  Lung cancer. What should I know about heart disease, diabetes, and high blood  pressure? Blood pressure and heart disease  High blood pressure causes heart disease and increases the risk of stroke. This is more likely to develop in people who have high blood pressure readings, are of African descent, or are overweight.  Have your blood pressure checked: ? Every 3-5 years if you are 18-39 years of age. ? Every year if you are 40 years old or older. Diabetes Have regular diabetes screenings. This checks your fasting blood sugar level. Have the screening done:  Once every three years after age 40 if you are at a normal weight and have a low risk for diabetes.  More often and at a younger age if you are overweight or have a high risk for diabetes. What should I know about preventing infection? Hepatitis B If you have a higher risk for hepatitis B, you should be screened for this virus. Talk with your health care provider to find out if you are at risk for hepatitis B infection. Hepatitis C Testing is recommended for:  Everyone born from 1945 through 1965.  Anyone with known risk factors for hepatitis C. Sexually transmitted infections (STIs)  Get screened for STIs, including gonorrhea and chlamydia, if: ? You are sexually active and are younger than 59 years of age. ? You are older than 59 years of age and your health care provider tells you that you are at risk for this type of infection. ? Your sexual activity has changed since you were last screened, and you are at increased risk for chlamydia or gonorrhea. Ask your health care provider   if you are at risk.  Ask your health care provider about whether you are at high risk for HIV. Your health care provider may recommend a prescription medicine to help prevent HIV infection. If you choose to take medicine to prevent HIV, you should first get tested for HIV. You should then be tested every 3 months for as long as you are taking the medicine. Pregnancy  If you are about to stop having your period (premenopausal) and  you may become pregnant, seek counseling before you get pregnant.  Take 400 to 800 micrograms (mcg) of folic acid every day if you become pregnant.  Ask for birth control (contraception) if you want to prevent pregnancy. Osteoporosis and menopause Osteoporosis is a disease in which the bones lose minerals and strength with aging. This can result in bone fractures. If you are 65 years old or older, or if you are at risk for osteoporosis and fractures, ask your health care provider if you should:  Be screened for bone loss.  Take a calcium or vitamin D supplement to lower your risk of fractures.  Be given hormone replacement therapy (HRT) to treat symptoms of menopause. Follow these instructions at home: Lifestyle  Do not use any products that contain nicotine or tobacco, such as cigarettes, e-cigarettes, and chewing tobacco. If you need help quitting, ask your health care provider.  Do not use street drugs.  Do not share needles.  Ask your health care provider for help if you need support or information about quitting drugs. Alcohol use  Do not drink alcohol if: ? Your health care provider tells you not to drink. ? You are pregnant, may be pregnant, or are planning to become pregnant.  If you drink alcohol: ? Limit how much you use to 0-1 drink a day. ? Limit intake if you are breastfeeding.  Be aware of how much alcohol is in your drink. In the [place] one drink equals one 12 oz bottle of beer (355 mL), one 5 oz glass of wine (148 mL), or one 1 oz glass of hard liquor (44 mL). General instructions  Schedule regular health, dental, and eye exams.  Stay current with your vaccines.  Tell your health care provider if: ? You often feel depressed. ? You have ever been abused or do not feel safe at home. Summary  Adopting a healthy lifestyle and getting preventive care are important in promoting health and wellness.  Follow your health care provider's instructions about healthy  diet, exercising, and getting tested or screened for diseases.  Follow your health care provider's instructions on monitoring your cholesterol and blood pressure. This information is not intended to replace advice given to you by your health care provider. Make sure you discuss any questions you have with your health care provider. Document Revised: 02/04/2018 Document Reviewed: 02/04/2018 Elsevier Patient Education  2021 Elsevier Inc.  

## 2020-03-07 NOTE — Progress Notes (Signed)
   Subjective:   Patient ID: Joan [name], female    DOB: November 19, 1960, 60 y.o.   MRN: [id number]  HPI The patient is a 60 YO female coming in for physical.   PMH, [place], social history reviewed and updated  Review of Systems  Constitutional: Negative.   HENT: Negative.   Eyes: Negative.   Respiratory: Negative for cough, chest tightness and shortness of breath.   Cardiovascular: Negative for chest pain, palpitations and leg swelling.  Gastrointestinal: Negative for abdominal distention, abdominal pain, constipation, diarrhea, nausea and vomiting.  Musculoskeletal: Negative.   Skin: Negative.   Neurological: Negative.   Psychiatric/Behavioral: Negative.     Objective:  Physical Exam Constitutional:      Appearance: She is well-developed and well-nourished. She is obese.  HENT:     Head: Normocephalic and atraumatic.  Eyes:     Extraocular Movements: EOM normal.  Cardiovascular:     Rate and Rhythm: Normal rate and regular rhythm.  Pulmonary:     Effort: Pulmonary effort is normal. No respiratory distress.     Breath sounds: Wheezing present. No rales.     Comments: Stable lung exam Abdominal:     General: Bowel sounds are normal. There is no distension.     Palpations: Abdomen is soft.     Tenderness: There is no abdominal tenderness. There is no rebound.  Musculoskeletal:        General: No edema.     Cervical back: Normal range of motion.  Skin:    General: Skin is warm and dry.  Neurological:     Mental Status: She is alert and oriented to person, place, and time.     Coordination: Coordination normal.  Psychiatric:        Mood and Affect: Mood and affect normal.     Vitals:   03/07/20 1431  BP: 120/80  Pulse: 75  Temp: 98.3 F (36.8 C)  TempSrc: Oral  SpO2: 94%  Weight: 214 lb (97.1 kg)  Height: 5\' 7"  (1.702 m)    This visit occurred during the SARS-CoV-2 public health emergency.  Safety protocols were in place, including screening questions prior  to the visit, additional usage of staff PPE, and extensive cleaning of exam room while observing appropriate contact time as indicated for disinfecting solutions.   Assessment & Plan:

## 2020-03-08 ENCOUNTER — Ambulatory Visit
Admission: RE | Admit: 2020-03-08 | Discharge: 2020-03-08 | Disposition: A | Discharge status: Home / Self Care | Payer: Medicare Other | Source: Ambulatory Visit | Attending: Acute Care | Admitting: Acute Care

## 2020-03-08 DIAGNOSIS — F17210 Nicotine dependence, cigarettes, uncomplicated: Secondary | ICD-10-CM

## 2020-03-08 DIAGNOSIS — I2510 Atherosclerotic heart disease of native coronary artery without angina pectoris: Secondary | ICD-10-CM | POA: Diagnosis not present

## 2020-03-08 DIAGNOSIS — J439 Emphysema, unspecified: Secondary | ICD-10-CM | POA: Diagnosis not present

## 2020-03-08 DIAGNOSIS — Z87891 Personal history of nicotine dependence: Secondary | ICD-10-CM

## 2020-03-08 DIAGNOSIS — J9811 Atelectasis: Secondary | ICD-10-CM | POA: Diagnosis not present

## 2020-03-09 NOTE — Progress Notes (Signed)

## 2020-03-10 ENCOUNTER — Other Ambulatory Visit: Payer: Self-pay | Admitting: Pulmonary Disease

## 2020-03-10 DIAGNOSIS — J441 Chronic obstructive pulmonary disease with (acute) exacerbation: Secondary | ICD-10-CM | POA: Diagnosis not present

## 2020-03-10 DIAGNOSIS — J9611 Chronic respiratory failure with hypoxia: Secondary | ICD-10-CM | POA: Diagnosis not present

## 2020-03-10 DIAGNOSIS — J449 Chronic obstructive pulmonary disease, unspecified: Secondary | ICD-10-CM | POA: Diagnosis not present

## 2020-03-10 NOTE — Assessment & Plan Note (Signed)
Flu shot up to date. Covid-19 2 shots counseled booster. Pneumonia declines. Shingrix declines. Tetanus up to date. Colonoscopy up to date. Mammogram up to date, pap smear declines. Counseled about sun safety and mole surveillance. Counseled about the dangers of distracted driving. Given 10 year screening recommendations.

## 2020-03-10 NOTE — Assessment & Plan Note (Signed)
Checking lipid panel and adjust lipitor 20 mg daily as needed. 

## 2020-03-10 NOTE — Assessment & Plan Note (Signed)
Checking HgA1c, diet controlled currently. On statin. Checking microalbumin to creatinine ratio.

## 2020-03-10 NOTE — Assessment & Plan Note (Signed)
BP at goal on hctz and atenolol and checking CMP and adjust as needed.

## 2020-03-10 NOTE — Assessment & Plan Note (Signed)
Seeing pulmonary and overall stable at this time. Reminded to stay smoke free to help prevent worsening.

## 2020-03-10 NOTE — Assessment & Plan Note (Signed)
On lipitor checking lipid panel and adjust as needed.

## 2020-03-13 LAB — HEPATITIS C RNA QUANTITATIVE
HCV Quantitative Log: <1.18 Log IU/mL
HCV RNA, PCR, QN: <15 IU/mL

## 2020-03-13 LAB — HEPATITIS C ANTIBODY
Hepatitis C Ab: REACTIVE — AB
SIGNAL TO CUT-OFF: 7.93 — ABNORMAL HIGH (ref <1.00)

## 2020-03-14 ENCOUNTER — Ambulatory Visit: Payer: Medicare Other | Admitting: Allergy & Immunology

## 2020-03-14 ENCOUNTER — Encounter: Payer: Self-pay | Admitting: Allergy & Immunology

## 2020-03-14 ENCOUNTER — Other Ambulatory Visit: Payer: Self-pay

## 2020-03-14 VITALS — BP 126/74 | HR 86 | Temp 98.0°F | Resp 18 | Ht 68.0 in | Wt 212.4 lb

## 2020-03-14 DIAGNOSIS — J449 Chronic obstructive pulmonary disease, unspecified: Secondary | ICD-10-CM

## 2020-03-14 DIAGNOSIS — J3089 Other allergic rhinitis: Secondary | ICD-10-CM | POA: Diagnosis not present

## 2020-03-14 DIAGNOSIS — K219 Gastro-esophageal reflux disease without esophagitis: Secondary | ICD-10-CM

## 2020-03-14 DIAGNOSIS — J302 Other seasonal allergic rhinitis: Secondary | ICD-10-CM

## 2020-03-14 NOTE — Progress Notes (Signed)
FOLLOW UP  Date of Service/Encounter:  03/14/20   Assessment:   Seasonal and perennial allergic rhinitis (grasses, indoor molds and outdoor molds)  COPD - followed by Dr. Elsworth Soho  Gastroesophageal reflux disease - on PPI   Joan [name] presents for allergy testing.  She was negative with blood work, so we scheduled this visit to do more sensitive prick and intradermal testing.  We did find from testing today that she is sensitized to grasses as well as indoor and outdoor molds.  This certainly fits her report of her symptoms, which worsen when grass is mowed.  We did provide her with avoidance measures.  At this point in time, we elected to make medication changes since the regimen we had her on is working fairly well.  We did not address anything having to do with her breathing since she is followed so closely by pulmonology.  With the testing today, Xolair would be a consideration but her IgE level has not been high enough (it was only 10 when we got blood work in October 2021).  Allergy shots are an option, but I would like her breathing under better control before we attempted allergy shots.  Plan/Recommendations:   1. Allergic rhinitis - Testing today showed: grasses, indoor molds and outdoor molds - Copy of test results provided.  - Avoidance measures provided. - Continue with: Zyrtec (cetirizine) 10mg  tablet once daily, Singulair (montelukast) 10mg  daily, Flonase (fluticasone) two sprays per nostril daily, Astelin (azelastine) 2 sprays per nostril 1-2 times daily as needed and Pataday (olopatadine) one drop per eye twice daily as needed - You can use an extra dose of the antihistamine, if needed, for breakthrough symptoms.  - Consider nasal saline rinses 1-2 times daily to remove allergens from the nasal cavities as well as help with mucous clearance (this is especially helpful to do before the nasal sprays are given) - Consider allergy shots as a means of long-term control. - Allergy shots  "re-train" and "reset" the immune system to ignore environmental allergens and decrease the resulting immune response to those allergens (sneezing, itchy watery eyes, runny nose, nasal congestion, etc).    - Allergy shots improve symptoms in 75-85% of patients.  - We can discuss more at the next appointment if the medications are not working for you.  2. GERD  - Continue Protonix 40 mg once a day to decrease reflux - Continue dietary lifestyle modifications.  3. COPD - Lung testing not done since Dr. Elsworth Soho manages your breathing. - Continue montelukast 10 mg once a day to prevent cough or wheeze. - Continue Symbicort 2 puffs twice a day with a spacer to prevent cough or wheeze. - Continue Combivent 1 puff twice a day as directed.  4. Return in about 6 months (around 09/11/2020).   Subjective:   Joan [name] is a 60 y.o. female presenting today for follow up of  Chief Complaint  Patient presents with  . Allergy Testing    Off medications.     Joan [name] has a history of the following: Patient Active Problem List   Diagnosis Date Noted  . PTSD (post-traumatic stress disorder) 02/09/2020  . Mood disorder in conditions classified elsewhere 02/09/2020  . Generalized anxiety disorder 02/09/2020  . Type II diabetes mellitus with manifestations ([place]) 10/29/2019  . Coarse tremors 10/28/2019  . Chronic rhinitis 10/21/2019  . Allergic rhinitis 06/16/2019  . Cervical spondylosis without myelopathy 12/18/2018  . Hypertension   . Encounter for general adult medical examination  with abnormal findings 11/06/2018  . Tobacco abuse 01/15/2018  . Hypomagnesemia 12/29/2017  . Pulmonary nodule 11/11/2017  . Carotid artery disease ([place]) 11/05/2017  . OSA on CPAP 08/07/2017  . Gastroesophageal reflux disease 06/23/2017  . Hypothyroidism due to acquired atrophy of thyroid 04/05/2017  . Primary osteoarthritis involving multiple joints 04/05/2017  . Chronic constipation 02/10/2017   . Iron deficiency anemia due to dietary causes 02/10/2017  . Osteoarthritis 02/10/2017  . Hyperlipidemia associated with type 2 diabetes mellitus ([place]) 02/10/2017  . Chronic respiratory failure with hypoxia ([place]) 12/16/2016  . Essential hypertension 11/29/2016  . Vocal cord dysfunction   . COPD, severe ([place]) 09/24/2016  . Anxiety 09/24/2016    History obtained from: chart review and patient.  Joan [name] is a 60 y.o. female presenting for skin testing.  Since the last visit, she has had a couple of visit that she has needed to reschedule.   Asthma/Respiratory Symptom History: She reports that her breathing is out of control. She has "one foot in the grave and one out". She did pick up smoking again unfortunately. She has been under a lot of stress. She sees Dr. Elsworth Soho for her breathing. She is still on Symbicort twice daily and Combivent as needed.   She recently had a chest CT to follow-up on pulmonary nodules.  Per the read, lesions appeared benign.  They recommended continued annual screening.  She also had evidence of aortic atherosclerosis and emphysema.  Allergic Rhinitis Symptom History:  She is using montelukast at night. She also uses Benadryl one in the morning and one at night. She does not use her nose sprays at all. She uses something to help with dryness in her nose from her CPAP and her oxygen.    Otherwise, there have been no changes to her past medical history, surgical history, family history, or social history.    Review of Systems  Constitutional: Negative.  Negative for chills, fever, malaise/fatigue and weight loss.  HENT: Positive for congestion. Negative for ear discharge, ear pain and sinus pain.   Eyes: Negative for pain, discharge and redness.  Respiratory: Negative for cough, sputum production, shortness of breath and wheezing.   Cardiovascular: Negative.  Negative for chest pain and palpitations.  Gastrointestinal: Negative for abdominal pain, constipation,  diarrhea, heartburn, nausea and vomiting.  Skin: Negative.  Negative for itching and rash.  Neurological: Negative for dizziness and headaches.  Endo/Heme/Allergies: Positive for environmental allergies. Does not bruise/bleed easily.       Objective:   Blood pressure 126/74, pulse 86, temperature 98 F (36.7 C), resp. rate 18, height 5\' 8"  (1.727 m), weight 212 lb 6.4 oz (96.3 kg), SpO2 94 %. Body mass index is 32.3 kg/m.   Physical Exam:  Physical Exam Constitutional:      Appearance: She is well-developed.     Comments: Very talkative.  HENT:     Head: Normocephalic and atraumatic.     Right Ear: Tympanic membrane, ear canal and external ear normal.     Left Ear: Tympanic membrane, ear canal and external ear normal.     Nose: No nasal deformity, septal deviation, mucosal edema, rhinorrhea or epistaxis.     Right Turbinates: Enlarged and swollen.     Left Turbinates: Enlarged and swollen.     Right Sinus: No maxillary sinus tenderness or frontal sinus tenderness.     Left Sinus: No maxillary sinus tenderness or frontal sinus tenderness.     Comments: Expiratory wheezes, especially prominent at the bases.  Mouth/Throat:     Mouth: Oropharynx is clear and moist. Mucous membranes are not pale and not dry.     Pharynx: Uvula midline.  Eyes:     General:        Right eye: No discharge.        Left eye: No discharge.     Extraocular Movements: EOM normal.     Conjunctiva/sclera: Conjunctivae normal.     Right eye: Right conjunctiva is not injected. No chemosis.    Left eye: Left conjunctiva is not injected. No chemosis.    Pupils: Pupils are equal, round, and reactive to light.  Cardiovascular:     Rate and Rhythm: Normal rate and regular rhythm.     Heart sounds: Normal heart sounds.  Pulmonary:     Effort: Pulmonary effort is normal. No tachypnea, accessory muscle usage or respiratory distress.     Breath sounds: Normal breath sounds. No wheezing, rhonchi or rales.   Chest:     Chest wall: No tenderness.  Lymphadenopathy:     Cervical: No cervical adenopathy.  Skin:    General: Skin is warm.     Capillary Refill: Capillary refill takes less than 2 seconds.     Coloration: Skin is not pale.     Findings: No abrasion, erythema, petechiae or rash. Rash is not papular, urticarial or vesicular.     Comments: No eczematous or urticarial lesions noted.  Neurological:     Mental Status: She is alert.  Psychiatric:        Mood and Affect: Mood and affect normal.        Behavior: Behavior is cooperative.      Diagnostic studies:   Allergy Studies:    Airborne Adult Perc - 03/14/20 1355    Time Antigen Placed 1356    Allergen Manufacturer Lavella Hammock    Location Back    Number of Test 59    Panel 1 Select    1. Control-Buffer 50% Glycerol Negative    2. Control-Histamine 1 mg/ml 2+    3. Albumin saline Negative    4. Blaine Negative    5. [place] Negative    6. Johnson Negative    7. [place] Blue Negative    8. Meadow Fescue Negative    9. Perennial Rye Negative    10. Sweet Vernal Negative    11. Timothy Negative    12. Cocklebur Negative    13. Burweed Marshelder Negative    14. Ragweed, short Negative    15. Ragweed, Giant Negative    16. Plantain,  English Negative    17. Lamb's Quarters Negative    18. Sheep Sorrell Negative    19. Rough Pigweed Negative    20. Marsh Elder, Rough Negative    21. Mugwort, Common Negative    22. Ash mix Negative    23. Birch mix Negative    24. Beech American Negative    25. Box, Elder Negative    26. Cedar, red Negative    27. Cottonwood, [place] Negative    28. Elm mix Negative    29. Hickory Negative    30. Maple mix Negative    31. Oak, [place] mix Negative    32. Pecan Pollen Negative    33. Pine mix Negative    34. Sycamore Eastern Negative    35. [place], Black Pollen Negative    36. Alternaria alternata Negative    37. Cladosporium Herbarum Negative    38. Aspergillus mix Negative  39. Penicillium mix Negative    40. Bipolaris sorokiniana (Helminthosporium) Negative    41. Drechslera spicifera (Curvularia) Negative    42. Mucor plumbeus Negative    43. Fusarium moniliforme Negative    44. Aureobasidium pullulans (pullulara) Negative    45. Rhizopus oryzae Negative    46. Botrytis cinera Negative    47. Epicoccum nigrum Negative    48. Phoma betae Negative    49. Candida Albicans Negative    50. Trichophyton mentagrophytes Negative    51. Mite, D Farinae  5,000 AU/ml Negative    52. Mite, D Pteronyssinus  5,000 AU/ml Negative    53. Cat Hair 10,000 BAU/ml Negative    54.  Dog Epithelia Negative    55. Mixed Feathers Negative    56. Horse Epithelia Negative    57. Cockroach, German Negative    58. Mouse Negative    59. Tobacco Leaf Negative          Intradermal - 03/14/20 1433    Time Antigen Placed 1433    Allergen Manufacturer Lavella Hammock    Location Arm    Number of Test 15    Intradermal Select    Control Negative    [place] Negative    Johnson Negative    7 Grass 3+    Ragweed mix Negative    Weed mix Negative    Tree mix Negative    Mold 1 2+    Mold 2 1+    Mold 3 1+    Mold 4 2+    Cat Negative    Dog Negative    Cockroach Negative    Mite mix Negative           Allergy testing results were read and interpreted by myself, documented by clinical staff.      Salvatore Marvel, MD  [facility] [place]

## 2020-03-14 NOTE — Patient Instructions (Addendum)
1. Allergic rhinitis - Testing today showed: grasses, indoor molds and outdoor molds - Copy of test results provided.  - Avoidance measures provided. - Continue with: Zyrtec (cetirizine) 10mg  tablet once daily, Singulair (montelukast) 10mg  daily, Flonase (fluticasone) two sprays per nostril daily, Astelin (azelastine) 2 sprays per nostril 1-2 times daily as needed and Pataday (olopatadine) one drop per eye twice daily as needed - You can use an extra dose of the antihistamine, if needed, for breakthrough symptoms.  - Consider nasal saline rinses 1-2 times daily to remove allergens from the nasal cavities as well as help with mucous clearance (this is especially helpful to do before the nasal sprays are given) - Consider allergy shots as a means of long-term control. - Allergy shots "re-train" and "reset" the immune system to ignore environmental allergens and decrease the resulting immune response to those allergens (sneezing, itchy watery eyes, runny nose, nasal congestion, etc).    - Allergy shots improve symptoms in 75-85% of patients.  - We can discuss more at the next appointment if the medications are not working for you.  2. GERD  - Continue Protonix 40 mg once a day to decrease reflux - Continue dietary lifestyle modifications.  3. COPD - Lung testing not done since Dr. Elsworth Soho manages your breathing. - Continue montelukast 10 mg once a day to prevent cough or wheeze. - Continue Symbicort 2 puffs twice a day with a spacer to prevent cough or wheeze. - Continue Combivent 1 puff twice a day as directed.  4. Return in about 6 months (around 09/11/2020).    Please inform us of any Emergency Department visits, hospitalizations, or changes in symptoms. Call us before going to the ED for breathing or allergy symptoms since we might be able to fit you in for a sick visit. Feel free to contact us anytime with any questions, problems, or concerns.  It was a pleasure to see you again  today!  Websites that have reliable patient information: 1. American Academy of Asthma, Allergy, and Immunology: www.aaaai.org 2. Food Allergy Research and Education (FARE): foodallergy.org 3. Mothers of Asthmatics: http://www.asthmacommunitynetwork.org 4. American College of Allergy, Asthma, and Immunology: www.acaai.org   COVID-19 Vaccine Information can be found at: ShippingScam.co.uk For questions related to vaccine distribution or appointments, please email [email or web address] or call [phone].     "Like" [place] on Facebook and Instagram for our latest updates!       Make sure you are registered to vote! If you have moved or changed any of your contact information, you will need to get this updated before voting!  In some cases, you MAY be able to register to vote online: [email or web address]    Control of [place] and fungi can grow on a variety of surfaces provided certain temperature and moisture conditions exist.  Outdoor molds grow on plants, decaying vegetation and soil.  The major outdoor mold, Alternaria and Cladosporium, are found in very high numbers during hot and dry conditions.  Generally, a late Summer - Fall peak is seen for common outdoor fungal spores.  Rain will temporarily lower outdoor mold spore count, but counts rise rapidly when the rainy period ends.  The most important indoor molds are Aspergillus and Penicillium.  Dark, humid and poorly ventilated basements are ideal sites for mold growth.  The next most common sites of mold growth are the bathroom and the kitchen.  Outdoor (Seasonal) Mold Control  Positive outdoor molds via skin testing: Alternaria, Cladosporium, Bipolaris (Helminthsporium), Drechslera (Curvalaria)  and Mucor  1. Use air conditioning and keep windows closed 2. Avoid exposure to decaying vegetation. 3. Avoid leaf raking. 4. Avoid grain  handling. 5. Consider wearing a face mask if working in moldy areas.  6.   Indoor (Perennial) Mold Control   Positive indoor molds via skin testing: Aspergillus, Penicillium, Fusarium, Aureobasidium (Pullulara) and Rhizopus  1. Maintain humidity below 50%. 2. Clean washable surfaces with 5% bleach solution. 3. Remove sources e.g. contaminated carpets.     Reducing Pollen Exposure  The American Academy of Allergy, Asthma and Immunology suggests the following steps to reduce your exposure to pollen during allergy seasons.    1. Do not hang sheets or clothing out to dry; pollen may collect on these items. 2. Do not mow lawns or spend time around freshly cut grass; mowing stirs up pollen. 3. Keep windows closed at night.  Keep car windows closed while driving. 4. Minimize morning activities outdoors, a time when pollen counts are usually at their highest. 5. Stay indoors as much as possible when pollen counts or humidity is high and on windy days when pollen tends to remain in the air longer. 6. Use air conditioning when possible.  Many air conditioners have filters that trap the pollen spores. 7. Use a HEPA room air filter to remove pollen form the indoor air you breathe.  Allergy Shots   Allergies are the result of a chain reaction that starts in the immune system. Your immune system controls how your body defends itself. For instance, if you have an allergy to pollen, your immune system identifies pollen as an invader or allergen. Your immune system overreacts by producing antibodies called Immunoglobulin E (IgE). These antibodies travel to cells that release chemicals, causing an allergic reaction.  The concept behind allergy immunotherapy, whether it is received in the form of shots or tablets, is that the immune system can be desensitized to specific allergens that trigger allergy symptoms. Although it requires time and patience, the payback can be long-term relief.  How Do Allergy  Shots Work?  Allergy shots work much like a vaccine. Your body responds to injected amounts of a particular allergen given in increasing doses, eventually developing a resistance and tolerance to it. Allergy shots can lead to decreased, minimal or no allergy symptoms.  There generally are two phases: build-up and maintenance. Build-up often ranges from three to six months and involves receiving injections with increasing amounts of the allergens. The shots are typically given once or twice a week, though more rapid build-up schedules are sometimes used.  The maintenance phase begins when the most effective dose is reached. This dose is different for each person, depending on how allergic you are and your response to the build-up injections. Once the maintenance dose is reached, there are longer periods between injections, typically two to four weeks.  Occasionally doctors give cortisone-type shots that can temporarily reduce allergy symptoms. These types of shots are different and should not be confused with allergy immunotherapy shots.  Who Can Be Treated with Allergy Shots?  Allergy shots may be a good treatment approach for people with allergic rhinitis (hay fever), allergic asthma, conjunctivitis (eye allergy) or stinging insect allergy.   Before deciding to begin allergy shots, you should consider:  . The length of allergy season and the severity of your symptoms . Whether medications and/or changes to your environment can control your symptoms . Your desire to avoid long-term medication use . Time: allergy immunotherapy requires a major time  commitment . Cost: may vary depending on your insurance coverage  Allergy shots for children age 71 and older are effective and often well tolerated. They might prevent the onset of new allergen sensitivities or the progression to asthma.  Allergy shots are not started on patients who are pregnant but can be continued on patients who become  pregnant while receiving them. In some patients with other medical conditions or who take certain common medications, allergy shots may be of risk. It is important to mention other medications you talk to your allergist.   When Will I Feel Better?  Some may experience decreased allergy symptoms during the build-up phase. For others, it may take as long as 12 months on the maintenance dose. If there is no improvement after a year of maintenance, your allergist will discuss other treatment options with you.  If you aren't responding to allergy shots, it may be because there is not enough dose of the allergen in your vaccine or there are missing allergens that were not identified during your allergy testing. Other reasons could be that there are high levels of the allergen in your environment or major exposure to non-allergic triggers like tobacco smoke.  What Is the Length of Treatment?  Once the maintenance dose is reached, allergy shots are generally continued for three to five years. The decision to stop should be discussed with your allergist at that time. Some people may experience a permanent reduction of allergy symptoms. Others may relapse and a longer course of allergy shots can be considered.  What Are the Possible Reactions?  The two types of adverse reactions that can occur with allergy shots are local and systemic. Common local reactions include very mild redness and swelling at the injection site, which can happen immediately or several hours after. A systemic reaction, which is less common, affects the entire body or a particular body system. They are usually mild and typically respond quickly to medications. Signs include increased allergy symptoms such as sneezing, a stuffy nose or hives.  Rarely, a serious systemic reaction called anaphylaxis can develop. Symptoms include swelling in the throat, wheezing, a feeling of tightness in the chest, nausea or dizziness. Most serious systemic  reactions develop within 30 minutes of allergy shots. This is why it is strongly recommended you wait in your doctor's office for 30 minutes after your injections. Your allergist is trained to watch for reactions, and his or her staff is trained and equipped with the proper medications to identify and treat them.  Who Should Administer Allergy Shots?  The preferred location for receiving shots is your prescribing allergist's office. Injections can sometimes be given at another facility where the physician and staff are trained to recognize and treat reactions, and have received instructions by your prescribing allergist.

## 2020-03-15 ENCOUNTER — Telehealth: Payer: Self-pay | Admitting: Pulmonary Disease

## 2020-03-15 DIAGNOSIS — Z87891 Personal history of nicotine dependence: Secondary | ICD-10-CM

## 2020-03-15 DIAGNOSIS — F17210 Nicotine dependence, cigarettes, uncomplicated: Secondary | ICD-10-CM

## 2020-03-16 NOTE — Telephone Encounter (Signed)
LMTCB

## 2020-03-17 ENCOUNTER — Other Ambulatory Visit: Payer: Self-pay | Admitting: Internal Medicine

## 2020-03-22 ENCOUNTER — Ambulatory Visit (INDEPENDENT_AMBULATORY_CARE_PROVIDER_SITE_OTHER): Payer: Medicare Other | Admitting: Licensed Clinical Social Worker

## 2020-03-22 ENCOUNTER — Other Ambulatory Visit (HOSPITAL_COMMUNITY): Payer: Self-pay | Admitting: Psychiatry

## 2020-03-22 DIAGNOSIS — R443 Hallucinations, unspecified: Secondary | ICD-10-CM

## 2020-03-22 DIAGNOSIS — F411 Generalized anxiety disorder: Secondary | ICD-10-CM

## 2020-03-22 DIAGNOSIS — F4310 Post-traumatic stress disorder, unspecified: Secondary | ICD-10-CM | POA: Diagnosis not present

## 2020-03-22 DIAGNOSIS — F0630 Mood disorder due to known physiological condition, unspecified: Secondary | ICD-10-CM

## 2020-03-22 NOTE — Progress Notes (Signed)
Virtual Visit via Video Note  I connected with Joan [name] on 03/22/20 at  3:00 PM EST by a video enabled telemedicine application and verified that I am speaking with the correct person using two identifiers.  Location: Patient: home Provider: home office   I discussed the limitations of evaluation and management by telemedicine and the availability of in person appointments. The patient expressed understanding and agreed to proceed.   I discussed the assessment and treatment plan with the patient. The patient was provided an opportunity to ask questions and all were answered. The patient agreed with the plan and demonstrated an understanding of the instructions.   The patient was advised to call back or seek an in-person evaluation if the symptoms worsen or if the condition fails to improve as anticipated.  I provided 40 minutes of non-face-to-face time during this encounter.   THERAPIST PROGRESS NOTE  Session Time: 3:00 PM to 3:40 PM  Participation Level: Active  Behavioral Response: CasualAlertAnxious and Euthymic  Type of Therapy: Individual Therapy  Treatment Goals addressed:  PTSD, coping, Interventions: Solution Focused, Strength-based, Supportive and Other: coping  Summary: Joan [name] is a 60 y.o. female who presents with had been looking for a place to live but her credit isn't good. She got a phone call from "[employer]" after finding them online to help her to a point where credit will be good. Will allow her to use a credit card (that they provide) for one thing. They also are connected with apartments for  disabled and for older people and they are and can help her get in. Should be in by February. Siblings Ray and [name] Patient looked into it and say good program. Should be in an apartment and build her credit up. Other place called is possible down the line if she wants but they want her credit to go up.has had a hard time finding anything because everything  she looks for with low income is for students.  Discussed medical issues having trouble with her lungs takes everything she has even with oxygen. Made an earlier appointment with doctor because trouble with breathing and still not going to get something until February. Need tooth appointment and need glasses. Need dentures. Plans to take care of that. Daughter has been helpful with paying for dental care. Discussed providers in [facility] except hip doctor. Only can make one appointment a month has co pay. On phone with health insurance wanted to get on  rewards program. They have one to follow their health and doing the things you are supposed to do. Have a check list. Put money on a card, let it build. Certain activities will put money on her card.  Therapist pointed this out as a when when where she is addressing health issues and getting money for it also shows incentive and motivation on patients point to get involved with program.       Therapist reviewed symptoms, facilitated expression of thoughts and feelings and utilize positive reframing to note ways patient is making efforts to take care of herself, noted her efforts have helped her to move forward with addressing financial issues and appears to find a place to stay.  Noted positive developments and also pointed out will reduce stress that will help with her mental health.  Utilize processing of feelings to help patient cope with stressors.  Therapist provided active listening open questions supportive interventions Suicidal/Homicidal: No  Plan: Return again in 2 weeks.2. 2. Therapist will facilitated  patient talking about past as a way to work through trauma help decrease symptoms.3.Therapist help with stress management strategies, coping  Diagnosis: Axis I: PTSD, hallucinations, GAD, Mood Disorder in conditions classified elsewhere    Axis II: No diagnosis    [name], LCSW 03/22/2020

## 2020-03-23 NOTE — Telephone Encounter (Signed)
Left message for pt to call back  °

## 2020-03-24 ENCOUNTER — Encounter: Payer: Self-pay | Admitting: Allergy & Immunology

## 2020-03-27 ENCOUNTER — Ambulatory Visit: Payer: Medicare Other

## 2020-03-27 NOTE — Telephone Encounter (Signed)
Left message for pt to call back  °

## 2020-03-27 NOTE — Telephone Encounter (Signed)
Pt informed of CT results per [name] Groce, NP.  PT verbalized understanding.  Copy sent to PCP.  Order placed for 1 yr f/u CT.  

## 2020-03-28 ENCOUNTER — Other Ambulatory Visit: Payer: Self-pay | Admitting: Internal Medicine

## 2020-03-28 DIAGNOSIS — M5412 Radiculopathy, cervical region: Secondary | ICD-10-CM | POA: Diagnosis not present

## 2020-03-29 ENCOUNTER — Telehealth: Payer: Self-pay | Admitting: Pharmacist

## 2020-03-29 ENCOUNTER — Ambulatory Visit (INDEPENDENT_AMBULATORY_CARE_PROVIDER_SITE_OTHER): Payer: Medicare Other | Admitting: Pharmacist

## 2020-03-29 ENCOUNTER — Telehealth: Payer: Self-pay | Admitting: Pulmonary Disease

## 2020-03-29 DIAGNOSIS — J449 Chronic obstructive pulmonary disease, unspecified: Secondary | ICD-10-CM | POA: Diagnosis not present

## 2020-03-29 NOTE — Progress Notes (Signed)
Chronic Care Management Pharmacy Assistant   Name: Enza Shone  MRN: [id number] DOB: [birth year]  Reason for Encounter: Medication Review  PCP : [name], MD  Allergies:   Allergies  Allergen Reactions   Keflex [Cephalexin] Rash and Other (See Comments)    Has tolerated amoxicillin since had keflex   Prednisone Other (See Comments)    "counter reacts"   Shellfish Allergy Shortness Of Breath, Nausea And Vomiting and Other (See Comments)    Stomach cramps   Tetracyclines & Related Anaphylaxis, Swelling and Other (See Comments)    Throat swelling requiring hospitalization   Dilaudid [Hydromorphone Hcl] Other (See Comments)    "Lethargy"   Incruse Ellipta [Umeclidinium Bromide] Nausea Only   Tuberculin Tests Other (See Comments)    False postive    Medications: Outpatient Encounter Medications as of 03/29/2020  Medication Sig   acetaminophen (TYLENOL) 500 MG tablet Take 2 tablets (1,000 mg total) by mouth every 8 (eight) hours as needed for mild pain. (Patient not taking: Reported on 03/14/2020)   acyclovir (ZOVIRAX) 400 MG tablet TAKE 1 TABLET BY MOUTH  TWICE DAILY   albuterol (PROVENTIL) (2.5 MG/3ML) 0.083% nebulizer solution Take 3 mLs (2.5 mg total) by nebulization 2 (two) times daily. And every 6 hours as needed.  DX: J44.9   atenolol (TENORMIN) 25 MG tablet TAKE 1 TABLET BY MOUTH  DAILY   atorvastatin (LIPITOR) 20 MG tablet TAKE 1 TABLET BY MOUTH  DAILY   budesonide-formoterol (SYMBICORT) 160-4.5 MCG/ACT inhaler Inhale 2 puffs into the lungs 2 (two) times daily.   buPROPion (WELLBUTRIN XL) 300 MG 24 hr tablet Take 300 mg by mouth daily.   cyclobenzaprine (FLEXERIL) 10 MG tablet TAKE 1 TABLET BY MOUTH AT  BEDTIME   hydrochlorothiazide (HYDRODIURIL) 25 MG tablet TAKE 1 TABLET BY MOUTH  DAILY (Patient not taking: Reported on 03/14/2020)   Ipratropium-Albuterol (COMBIVENT RESPIMAT) 20-100 MCG/ACT AERS respimat Inhale 1 puff into the lungs every  6 (six) hours as needed for wheezing. (Patient taking differently: Inhale 1 puff into the lungs in the morning and at bedtime. Take additional puff if needed at bedtime)   levothyroxine (SYNTHROID) 25 MCG tablet TAKE 1 TABLET BY MOUTH  DAILY BEFORE BREAKFAST   Liniments (SALONPAS ARTHRITIS PAIN RELIEF EX) Apply 1 patch topically daily as needed (to painful sites- remove as directed).    Melatonin 10 MG TABS Take 10 mg by mouth at bedtime. (Patient not taking: Reported on 03/14/2020)   montelukast (SINGULAIR) 10 MG tablet TAKE 1 TABLET BY MOUTH AT  BEDTIME   naproxen (NAPROSYN) 500 MG tablet TAKE 1 TABLET BY MOUTH  TWICE DAILY WITH A MEAL   nystatin-triamcinolone ointment (MYCOLOG) Apply 1 application topically 2 (two) times daily as needed (applied to skin folds/groins/under breast skin irritation rash.). (Patient taking differently: Apply 1 application topically 2 (two) times daily as needed (to skin folds/groin/under breast skin- for irritation/rashes).)   OXYGEN Inhale 2-3 L/min into the lungs See admin instructions. Inhale  2-3 L/min of oxygen into the lungs w/CPAP at bedtime and as needed for shortness of breath with "strenuous activity" during the day   pantoprazole (PROTONIX) 40 MG tablet TAKE 1 TABLET BY MOUTH  DAILY   PATADAY 0.2 % SOLN Apply 1 drop to eye daily.   polyethylene glycol (MIRALAX / GLYCOLAX) 17 g packet Take 17 g by mouth daily as needed for mild constipation.   pregabalin (LYRICA) 75 MG capsule Take 1 capsule (75 mg total) by mouth 2 (  two) times daily. (Patient taking differently: Take 150 mg by mouth at bedtime.)   QUEtiapine (SEROQUEL) 50 MG tablet TAKE 1 TABLET BY MOUTH AT  BEDTIME   roflumilast (DALIRESP) 500 MCG TABS tablet Take 1 tablet (500 mcg total) by mouth daily.   traZODone (DESYREL) 100 MG tablet TAKE 1 TABLET BY MOUTH AT  BEDTIME   venlafaxine XR (EFFEXOR-XR) 150 MG 24 hr capsule TAKE 1 CAPSULE BY MOUTH  DAILY WITH BREAKFAST   No  facility-administered encounter medications on file as of 03/29/2020.    Current Diagnosis: Patient Active Problem List   Diagnosis Date Noted   PTSD (post-traumatic stress disorder) 02/09/2020   Mood disorder in conditions classified elsewhere 02/09/2020   Generalized anxiety disorder 02/09/2020   Type II diabetes mellitus with manifestations ([place]) 10/29/2019   Coarse tremors 10/28/2019   Chronic rhinitis 10/21/2019   Allergic rhinitis 06/16/2019   Cervical spondylosis without myelopathy 12/18/2018   Hypertension    Encounter for general adult medical examination with abnormal findings 11/06/2018   Tobacco abuse 01/15/2018   Hypomagnesemia 12/29/2017   Pulmonary nodule 11/11/2017   Carotid artery disease ([place]) 11/05/2017   OSA on CPAP 08/07/2017   Gastroesophageal reflux disease 06/23/2017   Hypothyroidism due to acquired atrophy of thyroid 04/05/2017   Primary osteoarthritis involving multiple joints 04/05/2017   Chronic constipation 02/10/2017   Iron deficiency anemia due to dietary causes 02/10/2017   Osteoarthritis 02/10/2017   Hyperlipidemia associated with type 2 diabetes mellitus ([place]) 02/10/2017   Chronic respiratory failure with hypoxia ([place]) 12/16/2016   Essential hypertension 11/29/2016   Vocal cord dysfunction    COPD, severe ([place]) 09/24/2016   Anxiety 09/24/2016    Goals Addressed   None     Follow-Up:  Pharmacist Review   Returned patient call about her inhalers, patient states that she has already spoken with the clinical pharmacist Mendel Ryder.   Wendy Poet, [place] [phone]

## 2020-03-29 NOTE — Progress Notes (Signed)
Received call from patient to discuss inhalers.  Pt reports she received letter from [facility] that Combivent PAP was approved for 2022.  Pt asked about status of Symbicort application. I just received signed [place] (Symbicort, Daliresp) application from pulmonary MD Dr Elsworth Soho yesterday, so I faxed it this morning. Will await determination.  Pt also reports she was advised by pulmonary to take albuterol inhaler in addition to Combivent. She does not have an active Rx for albuterol HFA on file, only nebulizer. Advised pt to contact prescriber to refill albuterol HFA and nebulizer solution to pharmacy of her choice as these are covered by insurance at low tier.   Pt voiced understanding of above.

## 2020-03-30 NOTE — Telephone Encounter (Signed)
Attempted to contact pt. Left message for pt to call back. Need to clairfy which rescue inhaler pt is using. Pt is requesting albuterol inhaler refill but med list and last ov states pt is using Combivent inhaler.

## 2020-03-30 NOTE — Telephone Encounter (Signed)
Pt returning a phone call and states that combivent is to be used in the am and at night and the albuterol hfa is a rescue inhaler and she uses it with the symbicort. Pt states that RA switched some inhalers around at her last visit. Pt can be reached at [phone]

## 2020-03-30 NOTE — Telephone Encounter (Signed)
Attempted to contact pt. Left message to call back. I do not see a mention of an Albuterol HFA inhaler from last ov with Dr Elsworth Soho on 12/10/19.

## 2020-03-31 MED ORDER — ALBUTEROL SULFATE HFA 108 (90 BASE) MCG/ACT IN AERS: 2.0000 | INHALATION_SPRAY | Freq: Four times a day (QID) | RESPIRATORY_TRACT | 3 refills | Status: DC | PRN

## 2020-03-31 MED ORDER — ALBUTEROL SULFATE (2.5 MG/3ML) 0.083% IN NEBU: 2.5000 mg | INHALATION_SOLUTION | Freq: Two times a day (BID) | RESPIRATORY_TRACT | 3 refills | Status: DC

## 2020-03-31 NOTE — Telephone Encounter (Signed)
Called and spoke with pt verifying meds that she needed to have sent to pharmacy. meds have been sent for pt. Nothing further needed.

## 2020-04-05 ENCOUNTER — Ambulatory Visit (INDEPENDENT_AMBULATORY_CARE_PROVIDER_SITE_OTHER): Payer: Medicare Other | Admitting: Licensed Clinical Social Worker

## 2020-04-05 DIAGNOSIS — R443 Hallucinations, unspecified: Secondary | ICD-10-CM

## 2020-04-05 DIAGNOSIS — F0630 Mood disorder due to known physiological condition, unspecified: Secondary | ICD-10-CM | POA: Diagnosis not present

## 2020-04-05 DIAGNOSIS — F411 Generalized anxiety disorder: Secondary | ICD-10-CM

## 2020-04-05 DIAGNOSIS — F4310 Post-traumatic stress disorder, unspecified: Secondary | ICD-10-CM | POA: Diagnosis not present

## 2020-04-05 NOTE — Progress Notes (Signed)
Virtual Visit via Video Note  I connected with Joan [name] on 04/05/20 at  1:00 PM EST by a video enabled telemedicine application and verified that I am speaking with the correct person using two identifiers.  Location: Patient: home Provider: home office   I discussed the limitations of evaluation and management by telemedicine and the availability of in person appointments. The patient expressed understanding and agreed to proceed.   I discussed the assessment and treatment plan with the patient. The patient was provided an opportunity to ask questions and all were answered. The patient agreed with the plan and demonstrated an understanding of the instructions.   The patient was advised to call back or seek an in-person evaluation if the symptoms worsen or if the condition fails to improve as anticipated.  I provided 52 minutes of non-face-to-face time during this encounter.  THERAPIST PROGRESS NOTE  Session Time: 1:00 PM to 1:52 PM  Participation Level: Active  Behavioral Response: CasualAlertEuthymic  Type of Therapy: Individual Therapy  Treatment Goals addressed:  PTSD, coping, Interventions: Solution Focused, Strength-based, Supportive and Other: coping  Summary: Joan [name] is a 60 y.o. female who presents with the "[employer]" want her to pay  $119 at first and each month $79 and credit should be good in about 6 months. Sending her credit card and want her spend small amounts and then pay it off. She maxed every credit card before now learning how to manage it better  Very excited about things such as working to get  her own place. Wants to apply for Section 8. Feels this help comes from [name], all this a blessing Still looking for places has an appointment this week with [facility]. The credit union will give her letter of recommendation they say will help her to get in. Feels things will happen when meant to happen, believes things come through [name] strong spirituality.  If people listen [name] will guide them. Learning to listen to [name], really know that [name] talks to her. Everything positive is good. If feels bad then not a good thing don't do it. Listens to [employer] strengthens her faith. Only thing she doesn't have is a Geneticist, molecular. Called lung doctor's for appointment told them trouble breathing and didn't give her one until February. Had medication took for 7 days. Feels a little better, has three inhalers. Will use before get up otherwise huffing and puffing will have to put on oxygen should do probably first. Smoking not helping, quit almost a year didn't make it but willing to do it again. Picked up because of stress, picked up because wanted to. Urges get stronger as you stop smoking. Doesn't have the will power. Will happen in time living day to day right now. Describes pain under breast hurt hard as rock. Will have it looked her focus right now on getting housing, looking at portal from [place] just got it up and will get more familiar with it. There is a section for resources thinks can apply for Section VIII. Discussed frustrations with false advertising, getting the run around, get send a lot of junk when looking for housing. You have to know where to look.      Therapist reviewed symptoms, facilitated expression of thoughts and feelings and noted patient is at a good place right now both because of the help from [place] as well as feeling positive about things will give her incentive to take positive steps with other goals like housing.  Noted patient strong spirituality and pointed out is a significant strength.  A powerful source for help as well.  Utilize motivational interviewing strategies to help patient motivate to stop smoking, significant particularly as she is having trouble breathing.  Utilize processing feelings as significant intervention for helping patient work through stressors and frustrations.  Therapist provided active listening,  open questions, supportive interventions Suicidal/Homicidal: No  Plan: Return again in 2 weeks.2.  Work with patient on stress management, trauma, coping  Diagnosis: Axis I:  PTSD, hallucinations, GAD, Mood Disorder in conditions classified elsewhere    Axis II: No diagnosis    [name], LCSW 04/05/2020

## 2020-04-10 ENCOUNTER — Telehealth: Payer: Self-pay | Admitting: Pharmacist

## 2020-04-10 DIAGNOSIS — J441 Chronic obstructive pulmonary disease with (acute) exacerbation: Secondary | ICD-10-CM | POA: Diagnosis not present

## 2020-04-10 DIAGNOSIS — J449 Chronic obstructive pulmonary disease, unspecified: Secondary | ICD-10-CM | POA: Diagnosis not present

## 2020-04-10 DIAGNOSIS — J9611 Chronic respiratory failure with hypoxia: Secondary | ICD-10-CM | POA: Diagnosis not present

## 2020-04-10 NOTE — Progress Notes (Signed)
Chronic Care Management Pharmacy Assistant   Name: Joan [name]  MRN: [id number] DOB: [birth year]  Reason for Encounter: General Adherence Call   PCP : Joan Koch, MD  Allergies:   Allergies  Allergen Reactions   Keflex [Cephalexin] Rash and Other (See Comments)    Has tolerated amoxicillin since had keflex   Prednisone Other (See Comments)    "counter reacts"   Shellfish Allergy Shortness Of Breath, Nausea And Vomiting and Other (See Comments)    Stomach cramps   Tetracyclines & Related Anaphylaxis, Swelling and Other (See Comments)    Throat swelling requiring hospitalization   Dilaudid [Hydromorphone Hcl] Other (See Comments)    "Lethargy"   Incruse Ellipta [Umeclidinium Bromide] Nausea Only   Tuberculin Tests Other (See Comments)    False postive    Medications: Outpatient Encounter Medications as of 04/10/2020  Medication Sig   acetaminophen (TYLENOL) 500 MG tablet Take 2 tablets (1,000 mg total) by mouth every 8 (eight) hours as needed for mild pain. (Patient not taking: Reported on 03/14/2020)   acyclovir (ZOVIRAX) 400 MG tablet TAKE 1 TABLET BY MOUTH  TWICE DAILY   albuterol (PROVENTIL) (2.5 MG/3ML) 0.083% nebulizer solution Take 3 mLs (2.5 mg total) by nebulization 2 (two) times daily. And every 6 hours as needed.  DX: J44.9   albuterol (VENTOLIN HFA) 108 (90 Base) MCG/ACT inhaler Inhale 2 puffs into the lungs every 6 (six) hours as needed for wheezing or shortness of breath.   atenolol (TENORMIN) 25 MG tablet TAKE 1 TABLET BY MOUTH  DAILY   atorvastatin (LIPITOR) 20 MG tablet TAKE 1 TABLET BY MOUTH  DAILY   budesonide-formoterol (SYMBICORT) 160-4.5 MCG/ACT inhaler Inhale 2 puffs into the lungs 2 (two) times daily.   buPROPion (WELLBUTRIN XL) 300 MG 24 hr tablet Take 300 mg by mouth daily.   cyclobenzaprine (FLEXERIL) 10 MG tablet TAKE 1 TABLET BY MOUTH AT  BEDTIME   hydrochlorothiazide (HYDRODIURIL) 25 MG tablet TAKE 1 TABLET BY  MOUTH  DAILY (Patient not taking: Reported on 03/14/2020)   Ipratropium-Albuterol (COMBIVENT RESPIMAT) 20-100 MCG/ACT AERS respimat Inhale 1 puff into the lungs every 6 (six) hours as needed for wheezing. (Patient taking differently: Inhale 1 puff into the lungs in the morning and at bedtime. Take additional puff if needed at bedtime)   levothyroxine (SYNTHROID) 25 MCG tablet TAKE 1 TABLET BY MOUTH  DAILY BEFORE BREAKFAST   Liniments (SALONPAS ARTHRITIS PAIN RELIEF EX) Apply 1 patch topically daily as needed (to painful sites- remove as directed).    Melatonin 10 MG TABS Take 10 mg by mouth at bedtime. (Patient not taking: Reported on 03/14/2020)   montelukast (SINGULAIR) 10 MG tablet TAKE 1 TABLET BY MOUTH AT  BEDTIME   naproxen (NAPROSYN) 500 MG tablet TAKE 1 TABLET BY MOUTH  TWICE DAILY WITH A MEAL   nystatin-triamcinolone ointment (MYCOLOG) Apply 1 application topically 2 (two) times daily as needed (applied to skin folds/groins/under breast skin irritation rash.). (Patient taking differently: Apply 1 application topically 2 (two) times daily as needed (to skin folds/groin/under breast skin- for irritation/rashes).)   OXYGEN Inhale 2-3 L/min into the lungs See admin instructions. Inhale  2-3 L/min of oxygen into the lungs w/CPAP at bedtime and as needed for shortness of breath with "strenuous activity" during the day   pantoprazole (PROTONIX) 40 MG tablet TAKE 1 TABLET BY MOUTH  DAILY   PATADAY 0.2 % SOLN Apply 1 drop to eye daily.   polyethylene glycol (MIRALAX / GLYCOLAX)  17 g packet Take 17 g by mouth daily as needed for mild constipation.   pregabalin (LYRICA) 75 MG capsule Take 1 capsule (75 mg total) by mouth 2 (two) times daily. (Patient taking differently: Take 150 mg by mouth at bedtime.)   QUEtiapine (SEROQUEL) 50 MG tablet TAKE 1 TABLET BY MOUTH AT  BEDTIME   roflumilast (DALIRESP) 500 MCG TABS tablet Take 1 tablet (500 mcg total) by mouth daily.   traZODone (DESYREL) 100  MG tablet TAKE 1 TABLET BY MOUTH AT  BEDTIME   venlafaxine XR (EFFEXOR-XR) 150 MG 24 hr capsule TAKE 1 CAPSULE BY MOUTH  DAILY WITH BREAKFAST   No facility-administered encounter medications on file as of 04/10/2020.    Current Diagnosis: Patient Active Problem List   Diagnosis Date Noted   PTSD (post-traumatic stress disorder) 02/09/2020   Mood disorder in conditions classified elsewhere 02/09/2020   Generalized anxiety disorder 02/09/2020   Type II diabetes mellitus with manifestations ([place]) 10/29/2019   Coarse tremors 10/28/2019   Chronic rhinitis 10/21/2019   Allergic rhinitis 06/16/2019   Cervical spondylosis without myelopathy 12/18/2018   Hypertension    Encounter for general adult medical examination with abnormal findings 11/06/2018   Tobacco abuse 01/15/2018   Hypomagnesemia 12/29/2017   Pulmonary nodule 11/11/2017   Carotid artery disease ([place]) 11/05/2017   OSA on CPAP 08/07/2017   Gastroesophageal reflux disease 06/23/2017   Hypothyroidism due to acquired atrophy of thyroid 04/05/2017   Primary osteoarthritis involving multiple joints 04/05/2017   Chronic constipation 02/10/2017   Iron deficiency anemia due to dietary causes 02/10/2017   Osteoarthritis 02/10/2017   Hyperlipidemia associated with type 2 diabetes mellitus ([place]) 02/10/2017   Chronic respiratory failure with hypoxia ([place]) 12/16/2016   Essential hypertension 11/29/2016   Vocal cord dysfunction    COPD, severe ([place]) 09/24/2016   Anxiety 09/24/2016    Goals Addressed   None     Follow-Up:  Pharmacist Review  A general and wellness adherence call was made to Joan [name] to check and see how she has been doing since she last spoke with the clinical pharmacist Joan [name]. The patient states that she is having so many issues with her breathing that she made herself an appointment with Dr. Elsworth Soho. She states that she is on oxygen all day and can't be with out it. She said that during  the day she puts he O2 on 3 and at night it's on 2. She has to sleep sitting up with a 2 or 3 pillows behind her back. The patient states that she has not yet received her inhalers from the manufacture but she does have an inhaler that she uses to get her by. She states that when she walks she has a tough time getting across the room without getting sob. She also stated that for the pass month her left breast has been hurting and she is not sure how the pain started. Patient stated that she just needs her inhaler. I let the patient know that I will pass along the information to [place].    Wendy Poet, Clinical Pharmacist Assistant [facility] [phone]  Total Time:27

## 2020-04-11 NOTE — Telephone Encounter (Signed)
Patient assistance update:  Combivent has been approved via [employer] and is in transit to patient.  Symbicort, Daliresp have not yet been approved by AZ&Me. Application was re-faxed for 2nd time today. Will follow up in 3-5 business days.

## 2020-04-13 ENCOUNTER — Encounter (HOSPITAL_COMMUNITY): Payer: Self-pay | Admitting: Psychiatry

## 2020-04-13 ENCOUNTER — Telehealth (INDEPENDENT_AMBULATORY_CARE_PROVIDER_SITE_OTHER): Payer: Medicare Other | Admitting: Psychiatry

## 2020-04-13 DIAGNOSIS — F411 Generalized anxiety disorder: Secondary | ICD-10-CM

## 2020-04-13 DIAGNOSIS — F0630 Mood disorder due to known physiological condition, unspecified: Secondary | ICD-10-CM

## 2020-04-13 DIAGNOSIS — F4310 Post-traumatic stress disorder, unspecified: Secondary | ICD-10-CM | POA: Diagnosis not present

## 2020-04-13 MED ORDER — BUPROPION HCL ER (XL) 150 MG PO TB24
150.0000 mg | ORAL_TABLET | Freq: Every day | ORAL | 0 refills | Status: DC
Start: 2020-04-13 — End: 2020-04-27

## 2020-04-13 NOTE — Progress Notes (Signed)
[place] Follow up visit  Patient Identification: Joan [name] MRN:  [id number] Date of Evaluation:  04/13/2020 Referral Source:Therapist [name] Chief Complaint:    depression follow up  Visit Diagnosis:    ICD-10-CM   1. Mood disorder in conditions classified elsewhere  F06.30   2. Generalized anxiety disorder  F41.1   3. PTSD (post-traumatic stress disorder)  F43.10       Virtual Visit via Telephone Note  I connected with [name] on 04/13/20 at  3:15 PM EST by telephone and verified that I am speaking with the correct person using two identifiers.  Location: Patient: home Provider: office   I discussed the limitations, risks, security and privacy concerns of performing an evaluation and management service by telephone and the availability of in person appointments. I also discussed with the patient that there may be a patient responsible charge related to this service. The patient expressed understanding and agreed to proceed.       I discussed the assessment and treatment plan with the patient. The patient was provided an opportunity to ask questions and all were answered. The patient agreed with the plan and demonstrated an understanding of the instructions.   The patient was advised to call back or seek an in-person evaluation if the symptoms worsen or if the condition fails to improve as anticipated.  I provided 15 minutes of non-face-to-face time during this encounter.   [name], MD   History of Present Illness: Patient is a 60  years old currently widow Caucasian female referred by her therapist for management of mood symptoms, depression possible PTSD and anxiety  Was doing fair last visit, now endorsing seeing shadows and bugs at night, stays in bed watches tv and wakes late Feels forgetful, does not want to lower sedating medications says it helps sleep   She is on seroquel, lyrica, trazadone, melatonin Also on lyrica would be helping  additionally as mood stabilizer explained Brother  Wants her out of home, she is looking but is stressful ans is contributing to her anxiety She is in therapy Aggravating factor; difficult childhood, Grief, difficult relationship with middle kid, hip replacement Modifying factor: siblings but still has difficult time with them   Duration adult life Severity of depression: fair,       Past Psychiatric History: depression, drug use  Previous Psychotropic Medications: Yes   Substance Abuse History in the last 12 months:  No.  Consequences of Substance Abuse: history of extensive drug use uptil 8 years ago and have been in rehab programs  Past Medical History:  Past Medical History:  Diagnosis Date  . Acute respiratory failure with hypoxia ([place])   . Alcoholism and drug addiction in family   . Anxiety   . Appendicitis   . Arthritis    "knees, hips, lower back" (09/25/2016)  . Asthma   . Bipolar disorder ([place])   . Chronic bronchitis ([place])    "all the time" (09/25/2016)  . Chronic leg pain    RLE  . Chronic lower back pain   . COPD (chronic obstructive pulmonary disease) ([place])   . Depression   . Diabetes mellitus without complication ([place])   . Diabetic neuropathy ([place])   . Early cataracts, bilateral   . Emphysema of lung ([place])   . Generalized anxiety disorder 02/09/2020  . GERD (gastroesophageal reflux disease)   . Headache   . Hepatitis C    "treated back in 2001-2002"  . High cholesterol   .  Hypertension   . Hypothyroidism   . Incarcerated ventral hernia 04/04/2017  . Mood disorder in conditions classified elsewhere 02/09/2020  . On home oxygen therapy    "2L; at nighttime" (04/04/2017)  . OSA on CPAP    CPAP with Oxygen 2 liters  . Paranoid ([place])   . Pneumonia    "all the time" (09/25/2016)  . Positive PPD    with negative chest x ray  . PTSD (post-traumatic stress disorder) 02/09/2020  . Tachycardia    patient reports with exertion ; denies any other conditions    . Thyroid disease   . Wears glasses   . Wears partial dentures     Past Surgical History:  Procedure Laterality Date  . BACK SURGERY    . BIOPSY  11/24/2018   Procedure: BIOPSY;  Surgeon: Thornton Park, MD;  [facility]: [facility];  Service: Gastroenterology;;  . CARDIAC CATHETERIZATION    . COLONOSCOPY    . COLONOSCOPY WITH PROPOFOL N/A 11/24/2018   Procedure: COLONOSCOPY WITH PROPOFOL;  Surgeon: Thornton Park, MD;  [facility];  Service: Gastroenterology;  Laterality: N/A;  . CYST EXCISION     removal of cyst in sinuses  . DILATION AND CURETTAGE OF UTERUS    . ESOPHAGOGASTRODUODENOSCOPY (EGD) WITH PROPOFOL N/A 11/24/2018   Procedure: ESOPHAGOGASTRODUODENOSCOPY (EGD) WITH PROPOFOL;  Surgeon: Thornton Park, MD;  [facility];  Service: Gastroenterology;  Laterality: N/A;  . FOOT SURGERY Bilateral    bone removed from 4th and 5 th toes and put in pin then took pin out  . HERNIA REPAIR    . INCISION AND DRAINAGE  ~ 2014/2015   "removed mesh & infection"  . INCISIONAL HERNIA REPAIR N/A 04/04/2017   Procedure: LAPAROSCOPIC INCISIONAL HERNIA REPAIR WITH MESH;  Surgeon: Fanny Skates, MD;  Location: [facility];  Service: General;  Laterality: N/A;  . LACERATION REPAIR Right    repair wrist artery from laceration from ice skate  . LAPAROSCOPIC APPENDECTOMY N/A 03/31/2019   Procedure: APPENDECTOMY LAPAROSCOPIC;  Surgeon: Ralene Ok, MD;  Location: [facility];  Service: General;  Laterality: N/A;  . LAPAROSCOPIC APPENDECTOMY N/A 07/09/2019   Procedure: APPENDECTOMY LAPAROSCOPIC;  Surgeon: Ralene Ok, MD;  Location: [facility];  Service: General;  Laterality: N/A;  . LAPAROSCOPIC INCISIONAL / UMBILICAL / [place]  04/04/2017   LAPAROSCOPIC INCISIONAL HERNIA REPAIR WITH MESH  . LIPOMA EXCISION Right    fattty lipoma in neck  . NASAL SEPTUM SURGERY    . POSTERIOR LUMBAR FUSION  2015/2016 X 2   "added rods and screws"  . SINOSCOPY    . TOTAL HIP  ARTHROPLASTY Right 02/05/2017   Procedure: RIGHT TOTAL HIP ARTHROPLASTY;  Surgeon: [name], MD;  [facility];  Service: Orthopedics;  Laterality: Right;  . TOTAL HIP ARTHROPLASTY Left 06/18/2017   Procedure: LEFT TOTAL HIP ARTHROPLASTY;  Surgeon: [name], MD;  [facility];  Service: Orthopedics;  Laterality: Left;  . TOTAL HIP REVISION Left 01/26/2019   Procedure: TOTAL HIP REVISION;  Surgeon: Paralee Cancel, MD;  Location[facility];  Service: Orthopedics;  Laterality: Left;  2 hrs  . TUBAL LIGATION    . UMBILICAL HERNIA REPAIR  ~ 2013   w/mesh  . UPPER GI ENDOSCOPY      Family Psychiatric History: FAther : alcohol use  Family History:  Family History  Problem Relation Age of Onset  . Diabetes Mother   . Hypertension Mother   . Hypertension Father   . Cancer Father  lung    Social History:   Social History   Socioeconomic History  . Marital status: Widowed    Spouse name: Not on file  . Number of children: 3  . Years of education: Not on file  . Highest education level: Not on file  Occupational History  . Not on file  Tobacco Use  . Smoking status: Former Smoker    Packs/day: 1.00    Years: 44.00    Pack years: 44.00    Types: Cigarettes    Quit date: 11/30/2018    Years since quitting: 1.3  . Smokeless tobacco: Never Used  . Tobacco comment: Smoker 1-1.5 PPD every day, 11-20-2018 "i got myself down to half a pack", i am gonna be going on chantix on the 28th"  Vaping Use  . Vaping Use: Never used  Substance and Sexual Activity  . Alcohol use: Not Currently  . Drug use: Not Currently    Types: "Crack" cocaine    Comment:  "nothing since 01/12/2012"  . Sexual activity: Not Currently    Partners: Male  Other Topics Concern  . Not on file  Social History Narrative  . Not on file   Social Determinants of Health   Financial Resource Strain: Low Risk   . Difficulty of Paying Living Expenses: Not very hard  Food Insecurity: Not on file   Transportation Needs: Not on file  Physical Activity: Not on file  Stress: Not on file  Social Connections: Not on file      Allergies:   Allergies  Allergen Reactions  . Keflex [Cephalexin] Rash and Other (See Comments)    Has tolerated amoxicillin since had keflex  . Prednisone Other (See Comments)    "counter reacts"  . Shellfish Allergy Shortness Of Breath, Nausea And Vomiting and Other (See Comments)    Stomach cramps  . Tetracyclines & Related Anaphylaxis, Swelling and Other (See Comments)    Throat swelling requiring hospitalization  . Dilaudid [Hydromorphone Hcl] Other (See Comments)    "Lethargy"  . Incruse Ellipta [Umeclidinium Bromide] Nausea Only  . Tuberculin Tests Other (See Comments)    False postive    Metabolic Disorder Labs: Lab Results  Component Value Date   HGBA1C 7.2 (H) 03/07/2020   MPG 146 10/28/2019   MPG 131.24 03/31/2019   No results found for: PROLACTIN Lab Results  Component Value Date   CHOL 141 03/07/2020   TRIG 143.0 03/07/2020   HDL 54.30 03/07/2020   CHOLHDL 3 03/07/2020   VLDL 28.6 03/07/2020   LDLCALC 58 03/07/2020   LDLCALC 62 08/19/2017   Lab Results  Component Value Date   TSH 0.89 10/28/2019    Therapeutic Level Labs: No results found for: LITHIUM No results found for: CBMZ No results found for: VALPROATE  Current Medications: Current Outpatient Medications  Medication Sig Dispense Refill  . acetaminophen (TYLENOL) 500 MG tablet Take 2 tablets (1,000 mg total) by mouth every 8 (eight) hours as needed for mild pain. (Patient not taking: Reported on 03/14/2020) 30 tablet 0  . acyclovir (ZOVIRAX) 400 MG tablet TAKE 1 TABLET BY MOUTH  TWICE DAILY 180 tablet 1  . albuterol (PROVENTIL) (2.5 MG/3ML) 0.083% nebulizer solution Take 3 mLs (2.5 mg total) by nebulization 2 (two) times daily. And every 6 hours as needed.  DX: J44.9 360 mL 3  . albuterol (VENTOLIN HFA) 108 (90 Base) MCG/ACT inhaler Inhale 2 puffs into the lungs  every 6 (six) hours as needed for wheezing or shortness of  breath. 18 g 3  . atenolol (TENORMIN) 25 MG tablet TAKE 1 TABLET BY MOUTH  DAILY 90 tablet 3  . atorvastatin (LIPITOR) 20 MG tablet TAKE 1 TABLET BY MOUTH  DAILY 90 tablet 3  . budesonide-formoterol (SYMBICORT) 160-4.5 MCG/ACT inhaler Inhale 2 puffs into the lungs 2 (two) times daily. 2 Inhaler 0  . buPROPion (WELLBUTRIN XL) 150 MG 24 hr tablet Take 1 tablet (150 mg total) by mouth daily. 30 tablet 0  . cyclobenzaprine (FLEXERIL) 10 MG tablet TAKE 1 TABLET BY MOUTH AT  BEDTIME 90 tablet 1  . hydrochlorothiazide (HYDRODIURIL) 25 MG tablet TAKE 1 TABLET BY MOUTH  DAILY (Patient not taking: Reported on 03/14/2020) 90 tablet 3  . Ipratropium-Albuterol (COMBIVENT RESPIMAT) 20-100 MCG/ACT AERS respimat Inhale 1 puff into the lungs every 6 (six) hours as needed for wheezing. (Patient taking differently: Inhale 1 puff into the lungs in the morning and at bedtime. Take additional puff if needed at bedtime) 3 Inhaler 3  . levothyroxine (SYNTHROID) 25 MCG tablet TAKE 1 TABLET BY MOUTH  DAILY BEFORE BREAKFAST 90 tablet 3  . Liniments (SALONPAS ARTHRITIS PAIN RELIEF EX) Apply 1 patch topically daily as needed (to painful sites- remove as directed).     . Melatonin 10 MG TABS Take 10 mg by mouth at bedtime. (Patient not taking: Reported on 03/14/2020)    . montelukast (SINGULAIR) 10 MG tablet TAKE 1 TABLET BY MOUTH AT  BEDTIME 90 tablet 3  . naproxen (NAPROSYN) 500 MG tablet TAKE 1 TABLET BY MOUTH  TWICE DAILY WITH A MEAL 180 tablet 3  . nystatin-triamcinolone ointment (MYCOLOG) Apply 1 application topically 2 (two) times daily as needed (applied to skin folds/groins/under breast skin irritation rash.). (Patient taking differently: Apply 1 application topically 2 (two) times daily as needed (to skin folds/groin/under breast skin- for irritation/rashes).) 100 g 3  . OXYGEN Inhale 2-3 L/min into the lungs See admin instructions. Inhale  2-3 L/min of oxygen into  the lungs w/CPAP at bedtime and as needed for shortness of breath with "strenuous activity" during the day    . pantoprazole (PROTONIX) 40 MG tablet TAKE 1 TABLET BY MOUTH  DAILY 90 tablet 3  . PATADAY 0.2 % SOLN Apply 1 drop to eye daily.    . polyethylene glycol (MIRALAX / GLYCOLAX) 17 g packet Take 17 g by mouth daily as needed for mild constipation. 28 packet 0  . pregabalin (LYRICA) 75 MG capsule Take 1 capsule (75 mg total) by mouth 2 (two) times daily. (Patient taking differently: Take 150 mg by mouth at bedtime.) 180 capsule 1  . QUEtiapine (SEROQUEL) 50 MG tablet TAKE 1 TABLET BY MOUTH AT  BEDTIME 30 tablet 0  . roflumilast (DALIRESP) 500 MCG TABS tablet Take 1 tablet (500 mcg total) by mouth daily. 90 tablet 3  . traZODone (DESYREL) 100 MG tablet TAKE 1 TABLET BY MOUTH AT  BEDTIME 90 tablet 3  . venlafaxine XR (EFFEXOR-XR) 150 MG 24 hr capsule TAKE 1 CAPSULE BY MOUTH  DAILY WITH BREAKFAST 90 capsule 3   No current facility-administered medications for this visit.     Psychiatric Specialty Exam: Review of Systems  Cardiovascular: Negative for chest pain.  Psychiatric/Behavioral: Negative for substance abuse and suicidal ideas. The patient has insomnia.     There were no vitals taken for this visit.There is no height or weight on file to calculate BMI.  General Appearance:casual  Eye Contact:  fair  Speech:  Slow  Volume:  Decreased  Mood:fair  Affect:  Congruent  Thought Process:  Goal Directed  Orientation:  Full (Time, Place, and Person)  Thought Content:  rational  Suicidal Thoughts:  No  Homicidal Thoughts:  No  Memory:  Immediate;   Fair Recent;   Fair  Judgement:  Fair  Insight:  Shallow  Psychomotor Activity:  Decreased  Concentration:  Concentration: Fair and Attention Span: Fair  Recall:  [employer] of Knowledge:Fair  Language: Fair  Akathisia:  No  Handed:    AIMS (if indicated): no involuntary movements  Assets:  Desire for Improvement Leisure  Time Social Support  ADL's: limited due to recent surgery  Cognition: WNL  Sleep:  variable   Screenings: Mini-Mental   Flowsheet Row Clinical Support from 04/06/2018 in [facility]  Total Score (max 30 points ) 29    PHQ2-9   [place] from 05/24/2019 in [facility] at [facility] Patient Outreach Telephone from 12/18/2018 in [facility] Patient Outreach Telephone from 11/18/2018 in [facility] from 04/06/2018 in [facility] Visit from 11/28/2016 in [facility]  PHQ-2 Total Score 2 3 1 2 1   PHQ-9 Total Score 13 - - 6 -    Flowsheet Row Video Visit from 04/13/2020 in [facility] No Risk      Assessment and Plan: as follows Prior documentation reviewed  Mood disorder unspecified: relavant to multiple medical concerns and past history of using drugs, alcohol  Remains sober but feels forgetfull, endorses seeing shadows, will lower wellbutrin to 150mg  Work on sleep hygie she doesn't want to lower trazadone,   Hallucinations may be part of grief, PTSD or depression or possible part of schizoaffective disorder. Continue seroquel, lower wellbutrin continue therapy  Fu 4- 6 weeks or earlier if needed   [name], MD 2/17/20223:41 PM

## 2020-04-18 ENCOUNTER — Telehealth: Payer: Self-pay | Admitting: Pharmacist

## 2020-04-18 NOTE — Progress Notes (Signed)
   A phone call was made today to [employer] to check on the status of patient assistance form for Symbicort and daliresp. The representative Chastity stated that the form had not been received. She stated that the patient will need to re-enrolled for patient assistance. She stated that when th form is faxed to include the ID number to the form ([id number]) so that the person who receives it can process it immediately.    Wendy Poet, [place] [phone]

## 2020-04-19 ENCOUNTER — Ambulatory Visit (INDEPENDENT_AMBULATORY_CARE_PROVIDER_SITE_OTHER): Payer: Medicare Other | Admitting: Licensed Clinical Social Worker

## 2020-04-19 ENCOUNTER — Telehealth: Payer: Medicare Other

## 2020-04-19 DIAGNOSIS — F411 Generalized anxiety disorder: Secondary | ICD-10-CM | POA: Diagnosis not present

## 2020-04-19 DIAGNOSIS — F4310 Post-traumatic stress disorder, unspecified: Secondary | ICD-10-CM

## 2020-04-19 DIAGNOSIS — R443 Hallucinations, unspecified: Secondary | ICD-10-CM | POA: Diagnosis not present

## 2020-04-19 DIAGNOSIS — F0630 Mood disorder due to known physiological condition, unspecified: Secondary | ICD-10-CM

## 2020-04-19 NOTE — Progress Notes (Signed)
Virtual Visit via Video Note  I connected with Joan [name] on 04/19/20 at  1:00 PM EST by a video enabled telemedicine application and verified that I am speaking with the correct person using two identifiers.  Location: Patient: home Provider: home office   I discussed the limitations of evaluation and management by telemedicine and the availability of in person appointments. The patient expressed understanding and agreed to proceed.   I discussed the assessment and treatment plan with the patient. The patient was provided an opportunity to ask questions and all were answered. The patient agreed with the plan and demonstrated an understanding of the instructions.   The patient was advised to call back or seek an in-person evaluation if the symptoms worsen or if the condition fails to improve as anticipated.  I provided 50 minutes of non-face-to-face time during this encounter.  THERAPIST PROGRESS NOTE  Session Time: 1:00 PM to 1:50 PM  Participation Level: Active  Behavioral Response: CasualAlertAnxious  Type of Therapy: Individual Therapy  Treatment Goals addressed:  PTSD, coping, Interventions: Solution Focused, Strength-based, Supportive and Other: education on PTSD  Summary: Joan [name] is a 60 y.o. female who presents with health issues so scared about finding an apartment. Wanted to talk about other issues. Wanted to know if therapist works with co-occurring disorders mental health and substance abuse disorders.  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  Reviewed education on PTSD (see below) Patient shares need to diving into things. She avoids reminders of kids does have symptom of avoidance. Anything that triggers reminders of mother and child. Sees mother and children is a big reminder shakes her head don't want to see it brings back really bad. Anxious  is a good description of it. Hang on and let it ride out because forget where she is.  Therapist says the best way to manage is to write it through and not resist but at the same time need to work on avoidance as that keeps the symptoms going of trauma.  Patient shares she feels like she is right there again in the past. But not and right here. Wants to work on it pull it to the surface and get rid of it. Discussed resource she has and has [name] in her life that helps she has ways such going outside to help calm down.  Therapist reviewed symptoms, facilitated expression of thoughts and feelings and today talked about focusing specifically on trauma, therapist provided psychoeducation on trauma includes unprocessed memory and therapy helps either through EMDR or cognitive therapy of helping her store the memory.  Noted patient has flashbacks so very much needs to process memories she described specifically about kids.  Also related traumas hyperarousal it is the fight and flight that continues to happen from the trauma incident.  Will work with cognitive behavior approaches to trauma and if needed will refer to other types of treatments like EMDR.  Therapist provided active listening, open questions, supportive interventions Suicidal/Homicidal: No  Plan: Return again in 2 weeks.2.  Therapist introduced resources as well as suds and begin to introduce process of doing trauma work just making an outline of traumatic experiences.  Diagnosis: Axis I:  PTSD, hallucinations, GAD, Mood Disorder in conditions classified elsewhere    Axis II: No diagnosis    Joan Register, LCSW 04/19/2020

## 2020-04-21 ENCOUNTER — Ambulatory Visit (INDEPENDENT_AMBULATORY_CARE_PROVIDER_SITE_OTHER): Payer: Medicare Other | Admitting: Pulmonary Disease

## 2020-04-21 ENCOUNTER — Encounter: Payer: Self-pay | Admitting: Pulmonary Disease

## 2020-04-21 ENCOUNTER — Other Ambulatory Visit: Payer: Self-pay

## 2020-04-21 ENCOUNTER — Telehealth: Payer: Self-pay | Admitting: Pulmonary Disease

## 2020-04-21 DIAGNOSIS — J449 Chronic obstructive pulmonary disease, unspecified: Secondary | ICD-10-CM | POA: Diagnosis not present

## 2020-04-21 DIAGNOSIS — Z720 Tobacco use: Secondary | ICD-10-CM | POA: Diagnosis not present

## 2020-04-21 DIAGNOSIS — J9611 Chronic respiratory failure with hypoxia: Secondary | ICD-10-CM | POA: Diagnosis not present

## 2020-04-21 DIAGNOSIS — G4733 Obstructive sleep apnea (adult) (pediatric): Secondary | ICD-10-CM

## 2020-04-21 DIAGNOSIS — Z9989 Dependence on other enabling machines and devices: Secondary | ICD-10-CM | POA: Diagnosis not present

## 2020-04-21 DIAGNOSIS — J441 Chronic obstructive pulmonary disease with (acute) exacerbation: Secondary | ICD-10-CM | POA: Diagnosis not present

## 2020-04-21 MED ORDER — AZITHROMYCIN 250 MG PO TABS: ORAL_TABLET | ORAL | 0 refills | Status: DC

## 2020-04-21 MED ORDER — METHYLPREDNISOLONE ACETATE 80 MG/ML IJ SUSP
120.0000 mg | Freq: Once | INTRAMUSCULAR | Status: AC
  Administered 2020-04-21: 120 mg via INTRAMUSCULAR

## 2020-04-21 MED ORDER — METHYLPREDNISOLONE 4 MG PO TBPK: ORAL_TABLET | ORAL | 0 refills | Status: DC

## 2020-04-21 NOTE — Patient Instructions (Signed)
Solumedrol 120 mg IM x 1 Medrol dosepak  Z-pak   Please ask DME for portable concentrator

## 2020-04-21 NOTE — Assessment & Plan Note (Signed)
We will treat her for COPD exacerbation with IM Solu-Medrol 120 mg. Z-Pak, she is allergic to other medications including tetracyclines and Keflex She will continue Symbicort and use albuterol nebs as needed She will also continue on Roflumilast Hopefully we can avoid an ED visit but she knows to go to emergency room if her breathing gets worse

## 2020-04-21 NOTE — Addendum Note (Signed)
Addended by: Len Blalock on: 04/21/2020 03:16 PM   Modules accepted: Orders

## 2020-04-21 NOTE — Assessment & Plan Note (Signed)
Compliant with CPAP and this is helped improve her daytime somnolence and fatigue. Download on 10 cm confirms compliance, minimal leak and good control of events

## 2020-04-21 NOTE — Assessment & Plan Note (Signed)
Unfortunately, she has started smoking again. Smoking cessation was again emphasized is the most important intervention

## 2020-04-21 NOTE — Progress Notes (Signed)
Subjective:    Patient ID: Joan [name], female    DOB: 1960/12/31, 60 y.o.   MRN: [id number]  HPI  27 yoex-smokerforfollow-up ofsevereCOPD and OSA  Meds -allergy listed to prednisone-this caused pedal edema, prefers Medrol when needed    Chief Complaint  Patient presents with  . Follow-up    Copd, sleep apnea. C/o worsening sob with any exertion, prod cough with white to brown mucus.     - quit smoking in October 2020 but unfortunately has started smoking again. She lives with her brother who smokes, she had some family troubles and this seems to be a relief valve. She is compliant with Symbicort. Breathing has been bad for several days, cough with clear to yellow sputum, increased wheezing, feels like she needs to go to the emergency room and get "a shot and antibiotics "which have helped in the past  Allergies to tetracycline and Keflex noted  She has been using oxygen 24/7, arrives in a wheelchair because "I cannot walk from the waiting room to the office"        Significant tests/ events reviewed  NPSG 02/2017 >> no OSA HST 04/2017 15/h   02/2019 pFTS >> RATIO 45, fev 1 37% - UNCHANGED , + bd response, DLCO 44%  10/2016 PFTs showed ratio 49, FEV1 of 36%, FVC 58% and DLCO of 34% consistent with severe obstruction.  02/2020 LDCT >>  stable nodules  11/2016 CT chestbullous changes BL, 79mmRUl & 2 mm LULnodules >> stable 11/2017   ONO10/2018 on CPAP/room air showed desaturation for 1 hour 45 minutes started on nocturnal oxygen>>   Past Medical History:  Diagnosis Date  . Acute respiratory failure with hypoxia ([place])   . Alcoholism and drug addiction in family   . Anxiety   . Appendicitis   . Arthritis    "knees, hips, lower back" (09/25/2016)  . Asthma   . Bipolar disorder ([place])   . Chronic bronchitis ([place])    "all the time" (09/25/2016)  . Chronic leg pain    RLE  . Chronic lower back pain   . COPD (chronic obstructive  pulmonary disease) ([place])   . Depression   . Diabetes mellitus without complication ([place])   . Diabetic neuropathy ([place])   . Early cataracts, bilateral   . Emphysema of lung ([place])   . Generalized anxiety disorder 02/09/2020  . GERD (gastroesophageal reflux disease)   . Headache   . Hepatitis C    "treated back in 2001-2002"  . High cholesterol   . Hypertension   . Hypothyroidism   . Incarcerated ventral hernia 04/04/2017  . Mood disorder in conditions classified elsewhere 02/09/2020  . On home oxygen therapy    "2L; at nighttime" (04/04/2017)  . OSA on CPAP    CPAP with Oxygen 2 liters  . Paranoid ([place])   . Pneumonia    "all the time" (09/25/2016)  . Positive PPD    with negative chest x ray  . PTSD (post-traumatic stress disorder) 02/09/2020  . Tachycardia    patient reports with exertion ; denies any other conditions   . Thyroid disease   . Wears glasses   . Wears partial dentures      Review of Systems neg for any significant sore throat, dysphagia, itching, sneezing, nasal congestion or excess/ purulent secretions, fever, chills, sweats, unintended wt loss, pleuritic or exertional cp, hempoptysis, orthopnea pnd or change in chronic leg swelling. Also denies presyncope, palpitations, heartburn, abdominal pain, nausea, vomiting, diarrhea or  change in bowel or urinary habits, dysuria,hematuria, rash, arthralgias, visual complaints, headache, numbness weakness or ataxia.     Objective:   Physical Exam  Gen. Pleasant, obese, in no distress ENT - no lesions, no post nasal drip Neck: No JVD, no thyromegaly, no carotid bruits Lungs: no use of accessory muscles, no dullness to percussion, decreased without rales , BL scattered rhonchi  Cardiovascular: Rhythm regular, heart sounds  normal, no murmurs or gallops, no peripheral edema Musculoskeletal: No deformities, no cyanosis or clubbing , no tremors       Assessment & Plan:

## 2020-04-21 NOTE — Assessment & Plan Note (Signed)
She would like a portable concentrator but will need evaluation when she is back to her chronic stable state

## 2020-04-21 NOTE — Telephone Encounter (Signed)
Re-faxed paperwork 04/18/2020. Will follow up in 3-5 business days.

## 2020-04-21 NOTE — Telephone Encounter (Signed)
I have called the pt and she is aware that the medication was sent to [facility] that [facility] has requested.  Nothing further is needed.

## 2020-04-21 NOTE — Addendum Note (Signed)
Addended by: Len Blalock on: 04/21/2020 02:48 PM   Modules accepted: Orders

## 2020-04-24 DIAGNOSIS — J9611 Chronic respiratory failure with hypoxia: Secondary | ICD-10-CM | POA: Diagnosis not present

## 2020-04-24 DIAGNOSIS — J441 Chronic obstructive pulmonary disease with (acute) exacerbation: Secondary | ICD-10-CM | POA: Diagnosis not present

## 2020-04-24 DIAGNOSIS — J449 Chronic obstructive pulmonary disease, unspecified: Secondary | ICD-10-CM | POA: Diagnosis not present

## 2020-04-24 DIAGNOSIS — G4733 Obstructive sleep apnea (adult) (pediatric): Secondary | ICD-10-CM | POA: Diagnosis not present

## 2020-04-25 DIAGNOSIS — M47812 Spondylosis without myelopathy or radiculopathy, cervical region: Secondary | ICD-10-CM | POA: Diagnosis not present

## 2020-04-25 DIAGNOSIS — R519 Headache, unspecified: Secondary | ICD-10-CM | POA: Diagnosis not present

## 2020-04-26 ENCOUNTER — Telehealth: Payer: Self-pay | Admitting: Pharmacist

## 2020-04-26 NOTE — Progress Notes (Signed)
A call was made today to [employer] to check on the status of Joan [name]'s patient assistance application. Spoke with representative Joan [name]) and told her that the application has been faed over twice, once on 04/11/20 and again on 04/18/20. The rep stated that application has not been received neither time. She gave me the fax numbers that application should be faxed to [phone] and [phone].   Joan [name], [place] [phone]   Time spent:27

## 2020-04-27 ENCOUNTER — Other Ambulatory Visit (HOSPITAL_COMMUNITY): Payer: Self-pay | Admitting: Psychiatry

## 2020-04-28 NOTE — Telephone Encounter (Signed)
Re-faxed AZ&Me (Symbicort, Daliresp) application to [phone] as requested.  Will follow up in 3-5 business days.

## 2020-05-01 ENCOUNTER — Encounter: Payer: Self-pay | Admitting: Acute Care

## 2020-05-01 ENCOUNTER — Other Ambulatory Visit: Payer: Self-pay

## 2020-05-01 ENCOUNTER — Ambulatory Visit (INDEPENDENT_AMBULATORY_CARE_PROVIDER_SITE_OTHER): Payer: Medicare Other | Admitting: Acute Care

## 2020-05-01 VITALS — BP 120/74 | HR 97 | Temp 97.2°F | Ht 68.0 in | Wt 214.4 lb

## 2020-05-01 DIAGNOSIS — Z720 Tobacco use: Secondary | ICD-10-CM

## 2020-05-01 DIAGNOSIS — J9611 Chronic respiratory failure with hypoxia: Secondary | ICD-10-CM | POA: Diagnosis not present

## 2020-05-01 DIAGNOSIS — J449 Chronic obstructive pulmonary disease, unspecified: Secondary | ICD-10-CM | POA: Diagnosis not present

## 2020-05-01 NOTE — Progress Notes (Signed)
History of Present Illness Joan [name] is a 60 y.o. female current every day  smoker with severe COPD and OSA. She is followed by Joan [name].  Quit smoking 11/2018, and has restarted this visit.  Symbicort as Maintenance She is using albuterol nebs twice daily She is using her Combivent as her rescue inhaler.  Allergies to tetracycline and Keflex    05/01/2020 Follow up  OV for dyspnea. She was last seen by Joan [name] and treated with IM Solu-Medrol 120 mg. Z-Pak, and prednisone taper. She completed her treatment. She states she is a bit better, but she does not feel she is back to baseline. She states she has been coughing up white thick secretions. She has a flutter valve, she does not use it on a regular basis.She has not been using Mucinex. She is using her rescue inhaler 3-4 times a day. She states she has been using her oxygen at night.  She is not wearing her oxygen today. She states the tank is too heavy to carry.  She has started smoking again. Smoking cessation counseling done. She has used Chantix in the past with success. It is not being offered due to carcinogens. I have provided her with the 1-800-QUIT NOW card for free nicotine replacement therapy, and the number for smoking cessation classes through [facility]. .  She is here is a wheelchair today. She would like to have a POC visit to see if she can qualify for use.  She would also like to go to the DME and see if they have smaller tanks she can lift.  She denies fever, chest pain, orthopnea Compliant with CPAP and this is helped improve her daytime somnolence and fatigue. Download on 10 cm confirms compliance, minimal leak and good control of events AHI is 1.8. She has oxygen bled into her system.   Test Results: NPSG 02/2017 >> no OSA HST 04/2017 15/h 02/2019 pFTS >> RATIO 45, fev 1 37% - UNCHANGED , + bd response, DLCO 44% 10/2016 PFTs showed ratio 49, FEV1 of 36%, FVC 58% and DLCO of 34% consistent with severe  obstruction. 02/2020 LDCT >>  stable nodules  11/2016 CT chestbullous changes BL, 32mmRUl & 2 mm LULnodules >> stable 11/2017   ONO10/2018 on CPAP/room air showed desaturation for 1 hour 45 minutes started on nocturnal oxygen>>   CBC Latest Ref Rng & Units 03/07/2020 10/28/2019 07/09/2019  WBC 4.0 - 10.5 K/uL 8.7 10.1 8.4  Hemoglobin 12.0 - 15.0 g/dL 13.2 13.7 11.7(L)  Hematocrit 36.0 - 46.0 % 39.8 39.9 36.7  Platelets 150.0 - 400.0 K/uL 295.0 334 292    BMP Latest Ref Rng & Units 03/07/2020 10/28/2019 07/09/2019  Glucose 70 - 99 mg/dL 125(H) 102(H) 181(H)  BUN 6 - 23 mg/dL 15 14 15   Creatinine 0.40 - 1.20 mg/dL 1.10 1.18(H) 1.16(H)  BUN/Creat Ratio 6 - 22 (calc) - 12 -  Sodium 135 - 145 mEq/L 138 138 140  Potassium 3.5 - 5.1 mEq/L 4.2 3.9 2.9(L)  Chloride 96 - 112 mEq/L 97 93(L) 99  CO2 19 - 32 mEq/L 36(H) 33(H) 29  Calcium 8.4 - 10.5 mg/dL 10.0 10.1 9.2    BNP    Component Value Date/Time   BNP 114.5 (H) 12/29/2017 0609    ProBNP No results found for: PROBNP  PFT    Component Value Date/Time   FEV1PRE 1.06 03/17/2019 1444   FEV1POST 1.24 03/17/2019 1444   FVCPRE 2.35 03/17/2019 1444   FVCPOST 2.66 03/17/2019  1444   TLC 5.72 03/17/2019 1444   DLCOUNC 9.80 03/17/2019 1444   PREFEV1FVCRT 45 03/17/2019 1444   PSTFEV1FVCRT 47 03/17/2019 1444    No results found.   Past medical hx Past Medical History:  Diagnosis Date  . Acute respiratory failure with hypoxia ([place])   . Alcoholism and drug addiction in family   . Anxiety   . Appendicitis   . Arthritis    "knees, hips, lower back" (09/25/2016)  . Asthma   . Bipolar disorder ([place])   . Chronic bronchitis ([place])    "all the time" (09/25/2016)  . Chronic leg pain    RLE  . Chronic lower back pain   . COPD (chronic obstructive pulmonary disease) ([place])   . Depression   . Diabetes mellitus without complication ([place])   . Diabetic neuropathy ([place])   . Early cataracts, bilateral   . Emphysema of lung ([place])   .  Generalized anxiety disorder 02/09/2020  . GERD (gastroesophageal reflux disease)   . Headache   . Hepatitis C    "treated back in 2001-2002"  . High cholesterol   . Hypertension   . Hypothyroidism   . Incarcerated ventral hernia 04/04/2017  . Mood disorder in conditions classified elsewhere 02/09/2020  . On home oxygen therapy    "2L; at nighttime" (04/04/2017)  . OSA on CPAP    CPAP with Oxygen 2 liters  . Paranoid ([place])   . Pneumonia    "all the time" (09/25/2016)  . Positive PPD    with negative chest x ray  . PTSD (post-traumatic stress disorder) 02/09/2020  . Tachycardia    patient reports with exertion ; denies any other conditions   . Thyroid disease   . Wears glasses   . Wears partial dentures      Social History   Tobacco Use  . Smoking status: Current Every Day Smoker    Packs/day: 1.50    Years: 44.00    Pack years: 66.00    Types: Cigarettes  . Smokeless tobacco: Never Used  . Tobacco comment: down to 0.5ppd 04/21/20  Vaping Use  . Vaping Use: Never used  Substance Use Topics  . Alcohol use: Not Currently  . Drug use: Not Currently    Types: "Crack" cocaine    Comment:  "nothing since 01/12/2012"    JoanLawrence reports that she has been smoking cigarettes. She has a 66.00 pack-year smoking history. She has never used smokeless tobacco. She reports previous alcohol use. She reports previous drug use. Drug: "Crack" cocaine.  Tobacco Cessation: Wants to quit Counseling given  Past surgical hx, Family hx, Social hx all reviewed.  Current Outpatient Medications on File Prior to Visit  Medication Sig  . diazepam (VALIUM) 5 MG tablet Take oral route 45 minutes prior to procedure. Repeat PRN 20 minutes.  Marland Kitchen acetaminophen (TYLENOL) 500 MG tablet Take 2 tablets (1,000 mg total) by mouth every 8 (eight) hours as needed for mild pain.  Marland Kitchen acyclovir (ZOVIRAX) 400 MG tablet TAKE 1 TABLET BY MOUTH  TWICE DAILY  . albuterol (PROVENTIL) (2.5 MG/3ML) 0.083% nebulizer  solution Take 3 mLs (2.5 mg total) by nebulization 2 (two) times daily. And every 6 hours as needed.  DX: J44.9  . albuterol (VENTOLIN HFA) 108 (90 Base) MCG/ACT inhaler Inhale 2 puffs into the lungs every 6 (six) hours as needed for wheezing or shortness of breath.  Marland Kitchen atenolol (TENORMIN) 25 MG tablet TAKE 1 TABLET BY MOUTH  DAILY  . atorvastatin (LIPITOR)  20 MG tablet TAKE 1 TABLET BY MOUTH  DAILY  . azithromycin (ZITHROMAX Z-PAK) 250 MG tablet Take 2 tabs today, then 1 daily until gone  . budesonide-formoterol (SYMBICORT) 160-4.5 MCG/ACT inhaler Inhale 2 puffs into the lungs 2 (two) times daily.  Marland Kitchen buPROPion (WELLBUTRIN XL) 150 MG 24 hr tablet TAKE 1 TABLET BY MOUTH  DAILY  . Camphor-Menthol-Methyl Sal 3.03-02-08 % PTCH   . cyclobenzaprine (FLEXERIL) 10 MG tablet TAKE 1 TABLET BY MOUTH AT  BEDTIME  . diazepam (VALIUM) 5 MG tablet Take by mouth.  . hydrochlorothiazide (HYDRODIURIL) 25 MG tablet TAKE 1 TABLET BY MOUTH  DAILY  . Ipratropium-Albuterol (COMBIVENT RESPIMAT) 20-100 MCG/ACT AERS respimat Inhale 1 puff into the lungs every 6 (six) hours as needed for wheezing. (Patient taking differently: Inhale 1 puff into the lungs in the morning and at bedtime. Take additional puff if needed at bedtime)  . levothyroxine (SYNTHROID) 25 MCG tablet TAKE 1 TABLET BY MOUTH  DAILY BEFORE BREAKFAST  . Liniments (SALONPAS ARTHRITIS PAIN RELIEF EX) Apply 1 patch topically daily as needed (to painful sites- remove as directed).   . Melatonin 10 MG TABS Take 10 mg by mouth at bedtime.  . methylPREDNISolone (MEDROL DOSEPAK) 4 MG TBPK tablet Take as directed  . montelukast (SINGULAIR) 10 MG tablet TAKE 1 TABLET BY MOUTH AT  BEDTIME  . naproxen (NAPROSYN) 500 MG tablet TAKE 1 TABLET BY MOUTH  TWICE DAILY WITH A MEAL  . nystatin-triamcinolone ointment (MYCOLOG) Apply 1 application topically 2 (two) times daily as needed (applied to skin folds/groins/under breast skin irritation rash.). (Patient taking differently:  Apply 1 application topically 2 (two) times daily as needed (to skin folds/groin/under breast skin- for irritation/rashes).)  . OXYGEN Inhale 2-3 L/min into the lungs See admin instructions. Inhale  2-3 L/min of oxygen into the lungs w/CPAP at bedtime and as needed for shortness of breath with "strenuous activity" during the day  . pantoprazole (PROTONIX) 40 MG tablet TAKE 1 TABLET BY MOUTH  DAILY  . PATADAY 0.2 % SOLN Apply 1 drop to eye daily.  . polyethylene glycol (MIRALAX / GLYCOLAX) 17 g packet Take 17 g by mouth daily as needed for mild constipation.  . pregabalin (LYRICA) 75 MG capsule Take 1 capsule (75 mg total) by mouth 2 (two) times daily. (Patient taking differently: Take 150 mg by mouth at bedtime.)  . QUEtiapine (SEROQUEL) 50 MG tablet TAKE 1 TABLET BY MOUTH AT  BEDTIME  . roflumilast (DALIRESP) 500 MCG TABS tablet Take 1 tablet (500 mcg total) by mouth daily.  . traZODone (DESYREL) 100 MG tablet TAKE 1 TABLET BY MOUTH AT  BEDTIME  . venlafaxine XR (EFFEXOR-XR) 150 MG 24 hr capsule TAKE 1 CAPSULE BY MOUTH  DAILY WITH BREAKFAST   No current facility-administered medications on file prior to visit.     Allergies  Allergen Reactions  . Keflex [Cephalexin] Rash and Other (See Comments)    Has tolerated amoxicillin since had keflex  . Prednisone Other (See Comments)    "counter reacts"  . Shellfish Allergy Shortness Of Breath, Nausea And Vomiting and Other (See Comments)    Stomach cramps  . Tetracyclines & Related Anaphylaxis, Swelling and Other (See Comments)    Throat swelling requiring hospitalization  . Dilaudid [Hydromorphone Hcl] Other (See Comments)    "Lethargy"  . Incruse Ellipta [Umeclidinium Bromide] Nausea Only  . Other   . Tuberculin Tests Other (See Comments)    False postive    Review Of Systems:  Constitutional:   No  weight loss, night sweats,  Fevers, chills, + fatigue, or  lassitude.  HEENT:   No headaches,  Difficulty swallowing,  Tooth/dental  problems, or  Sore throat,                No sneezing, itching, ear ache, nasal congestion, post nasal drip,   CV:  No chest pain,  Orthopnea, PND, swelling in lower extremities, anasarca, dizziness, palpitations, syncope.   GI  No heartburn, indigestion, abdominal pain, nausea, vomiting, diarrhea, change in bowel habits, loss of appetite, bloody stools.   Resp: + shortness of breath with exertion or at rest.  + excess mucus, + productive cough,  No non-productive cough,  No coughing up of blood.  No change in color of mucus.  Occasional  wheezing.  No chest wall deformity  Skin: no rash or lesions.  GU: no dysuria, change in color of urine, no urgency or frequency.  No flank pain, no hematuria   MS:  No joint pain or swelling.  No decreased range of motion.  No back pain.  Psych:  No change in mood or affect. No depression or anxiety.  No memory loss.   Vital Signs BP 120/74 (BP Location: Right Arm, Cuff Size: Normal)   Pulse 97   Temp (!) 97.2 F (36.2 C) (Oral)   Ht 5\' 8"  (1.727 m)   Wt 214 lb 6.4 oz (97.3 kg)   SpO2 94%   BMI 32.60 kg/m    Physical Exam:  General- No distress,  A&Ox3, in a wheelchair ENT: No sinus tenderness, TM clear, pale nasal mucosa, no oral exudate,no post nasal drip, no LAN Cardiac: S1, S2, regular rate and rhythm, no murmur Chest: No wheeze/ rales/ dullness; no accessory muscle use, no nasal flaring, no sternal retractions Abd.: Soft Non-tender, ND, BS +, Body mass index is 32.6 kg/m. Ext: No clubbing cyanosis, edema Neuro:  Deconditioned, MAE x 4, A&O x 3, appropriate Skin: No rashes, warm and dry, no lesions Psych: normal mood and behavior   Assessment/Plan  COPD Flare resolved after treatment with Z pack and pred taper/ Solumedrol IM Plan We will see if your DME can offer you any lighter tank options. We will schedule you for a POC walk . Continue Symbicort as you have been doing. Rinse mouth after use. Continue Daliresp and Singulair  as you have been doing  Use Combivent as rescue per Dr. Bari Mantis recommendation.  Add Mucinex 1200 mg once daily , take with full glass of water.  Start using your Flutter valve, 4 blows three times daily. Make sure you clean flutter valve once a week. This should help thin your secretions and get them up.  Follow up in 2 months with Joan [name] or Judson Roch NP You must quit smoking. I will give you a 1-800-quit now card.  Please call for free nicotine patches , gum or mints.  We will consider Chantix if it comes back to the market.  Consider Breztri or Trelegy at follow up. Note your daily symptoms > remember "red flags" for COPD:  Increase in cough, increase in sputum production, increase in shortness of breath or activity intolerance. If you notice these symptoms, please call to be seen.    Please contact office for sooner follow up if symptoms do not improve or worsen or seek emergency care   Chronic hypoxic respiratory failure Plan Maintain oxygen saturations at 88-92% Wear oxygen at 2 L with exertions, and at bedtime with CPAP  Tobacco Abuse ( restarted) Wants to try and quit again Has had success on Chantix in the past Plan Quit smoking  I will give you a 1-800-quit now card.  Please call for free nicotine patches , gum or mints.  We will consider Chantix if it comes back to the market.     [name], NP 05/01/2020  11:36 AM

## 2020-05-01 NOTE — Patient Instructions (Addendum)
It is good to see you today. We will see if your DME can offer you any lighter tank options. We will schedule you for a POC walk . Continue Symbicort as you have been doing. Rinse mouth after use. Continue Daliresp and Singulair as you have been doing  Use Combivent as rescue per Dr. Bari Mantis recommendation.  Add Mucinex 1200 mg once daily , take with full glass of water.  Start using your Flutter valve, 4 blows three times daily. Make sure you clean flutter valve once a week. This should help thin your secretions and get them up.  Follow up in 2 months with Dr. Elsworth Soho or Judson Roch NP You must quit smoking. I nwill give you a 1-800-quit now card.  Please call for free nicotine patches , gum or mints.  Consider Breztri or Trelegy at follow up. Note your daily symptoms > remember "red flags" for COPD:  Increase in cough, increase in sputum production, increase in shortness of breath or activity intolerance. If you notice these symptoms, please call to be seen.    Please contact office for sooner follow up if symptoms do not improve or worsen or seek emergency care

## 2020-05-02 ENCOUNTER — Telehealth: Payer: Self-pay | Admitting: Pharmacist

## 2020-05-02 NOTE — Telephone Encounter (Signed)
Re-faxed AZ&Me application (attempt #4) to [phone].

## 2020-05-02 NOTE — Progress Notes (Signed)
A call was made today to AZ&ME to check on the status of Ms. Hassell Done patient [employer] for [employer]. I spoke to the representative Altha Harm) who stated that the application has not been received. I told her that the application was faxed on 3 other occassions to the number [phone]. She suggest that it be faxed to an alternate number instead [phone]. She stated if it is re-faxed, the application will be hold for processing.   Wendy Poet, [place] [phone]    Time spent:16

## 2020-05-03 ENCOUNTER — Ambulatory Visit (HOSPITAL_COMMUNITY): Payer: Medicare Other | Admitting: Licensed Clinical Social Worker

## 2020-05-03 DIAGNOSIS — F4310 Post-traumatic stress disorder, unspecified: Secondary | ICD-10-CM

## 2020-05-03 DIAGNOSIS — F0630 Mood disorder due to known physiological condition, unspecified: Secondary | ICD-10-CM

## 2020-05-03 DIAGNOSIS — R443 Hallucinations, unspecified: Secondary | ICD-10-CM

## 2020-05-03 DIAGNOSIS — F411 Generalized anxiety disorder: Secondary | ICD-10-CM

## 2020-05-03 NOTE — Progress Notes (Signed)
Patient's voice hoarse and difficult to understand. Patient says her voice comes and goes. Sometimes have to clean out her voice box. Therapist decided to cancel appointment due to the difficulty of understanding patient.

## 2020-05-04 ENCOUNTER — Encounter: Payer: Self-pay | Admitting: Internal Medicine

## 2020-05-04 ENCOUNTER — Telehealth: Payer: Self-pay | Admitting: Pharmacist

## 2020-05-04 ENCOUNTER — Other Ambulatory Visit: Payer: Self-pay

## 2020-05-04 ENCOUNTER — Ambulatory Visit (INDEPENDENT_AMBULATORY_CARE_PROVIDER_SITE_OTHER): Payer: Medicare Other | Admitting: Internal Medicine

## 2020-05-04 VITALS — BP 134/84 | HR 72 | Temp 98.4°F | Resp 18 | Ht 68.0 in | Wt 216.8 lb

## 2020-05-04 DIAGNOSIS — F4310 Post-traumatic stress disorder, unspecified: Secondary | ICD-10-CM | POA: Diagnosis not present

## 2020-05-04 DIAGNOSIS — J9611 Chronic respiratory failure with hypoxia: Secondary | ICD-10-CM | POA: Diagnosis not present

## 2020-05-04 DIAGNOSIS — J449 Chronic obstructive pulmonary disease, unspecified: Secondary | ICD-10-CM

## 2020-05-04 DIAGNOSIS — Z720 Tobacco use: Secondary | ICD-10-CM | POA: Diagnosis not present

## 2020-05-04 NOTE — Progress Notes (Unsigned)
   Subjective:   Patient ID: Joan [name], female    DOB: 02-Aug-1960, 60 y.o.   MRN: [id number]  HPI The patient is a 60 YO female coming in for several concerns including tobacco abuse (smoking again due to boredom, wanted to use chantix but this is off the market currently for potential to cause cancer, she states "that sucks because smoking seems a lot more likely to cause me cancer"), and COPD (needs home health as she is not getting around well, struggling to lift or carry her oxygen tank, could not bring with her today), and anxiety (she is in poor social situation and there is a lot of fighting with her brother whom she lives with and feels her therapist is not counseling her but rather chatting with her and she may change, needs help to find a new social situation for her to live away from her brother),   Review of Systems  Constitutional: Positive for activity change and appetite change.  HENT: Negative.   Eyes: Negative.   Respiratory: Positive for cough and shortness of breath. Negative for chest tightness.   Cardiovascular: Negative for chest pain, palpitations and leg swelling.  Gastrointestinal: Negative for abdominal distention, abdominal pain, constipation, diarrhea, nausea and vomiting.  Musculoskeletal: Positive for arthralgias, back pain and gait problem.  Skin: Negative.   Psychiatric/Behavioral: Negative.     Objective:  Physical Exam Constitutional:      Appearance: She is well-developed. She is obese.  HENT:     Head: Normocephalic and atraumatic.  Cardiovascular:     Rate and Rhythm: Normal rate and regular rhythm.  Pulmonary:     Effort: Pulmonary effort is normal. No respiratory distress.     Breath sounds: Wheezing present. No rales.     Comments: Stable lung exam Abdominal:     General: Bowel sounds are normal. There is no distension.     Palpations: Abdomen is soft.     Tenderness: There is no abdominal tenderness. There is no rebound.   Musculoskeletal:        General: Tenderness present.     Cervical back: Normal range of motion.  Skin:    General: Skin is warm and dry.  Neurological:     Mental Status: She is alert and oriented to person, place, and time.     Coordination: Coordination normal.  Psychiatric:     Comments: Wandering historian     Vitals:   05/04/20 1307  BP: 134/84  Pulse: 72  Resp: 18  Temp: 98.4 F (36.9 C)  TempSrc: Oral  SpO2: 95%  Weight: 216 lb 12.8 oz (98.3 kg)  Height: 5\' 8"  (1.727 m)    This visit occurred during the SARS-CoV-2 public health emergency.  Safety protocols were in place, including screening questions prior to the visit, additional usage of staff PPE, and extensive cleaning of exam room while observing appropriate contact time as indicated for disinfecting solutions.   Assessment & Plan:

## 2020-05-04 NOTE — Patient Instructions (Signed)
We will get you some therapy at home to help with strength and also a social worker to help you find somewhere to live.

## 2020-05-04 NOTE — Progress Notes (Signed)
Patient called today because she stated that she received a letter from [facility] about her combivent inhaler. She said that she did not know why they sent it to her. I told the patient that her combivent inhaler was just sent to her in February and was good until May. I told the patient it may be to recertify once prescription expire out in May but I will call [employer] to make sure.   Wendy Poet, [place] [phone]

## 2020-05-05 ENCOUNTER — Ambulatory Visit (INDEPENDENT_AMBULATORY_CARE_PROVIDER_SITE_OTHER): Payer: Medicare Other | Admitting: *Deleted

## 2020-05-05 ENCOUNTER — Ambulatory Visit: Payer: Self-pay | Admitting: *Deleted

## 2020-05-05 DIAGNOSIS — J449 Chronic obstructive pulmonary disease, unspecified: Secondary | ICD-10-CM

## 2020-05-05 DIAGNOSIS — I10 Essential (primary) hypertension: Secondary | ICD-10-CM

## 2020-05-05 NOTE — Patient Instructions (Signed)
Visit Information  As a part of your Medicare benefit, you are eligible for care management and care coordination services. I was unable to reach you by phone today but would like to speak with you about your health related needs. Please call me at [phone] or [phone].  Oneta Rack, RN, BSN, [facility] RN Care Coordination- [place] [phone]: direct office [phone]: mobile

## 2020-05-05 NOTE — Chronic Care Management (AMB) (Signed)
  Chronic Care Management   Outreach Note  05/05/2020 Name: Joan [name] MRN: [id number] DOB: 03-24-1960  Referred by: [name], MD Reason for referral : Chronic Care Management (Telephone Outreach- unsuccessful; COPD, HTN; Community Resources)  An unsuccessful telephone outreach was attempted today. The patient was referred to the case management team for assistance with care management and care coordination.   Follow Up Plan:   A HIPAA compliant phone message was left for the patient providing contact information and requesting a return call.   The patient has been provided with contact information for the care management team and has been advised to call with any health related questions or concerns.   Oneta Rack, RN, BSN, [facility] RN Care Coordination- [place] [phone]: direct office [phone]: mobile

## 2020-05-05 NOTE — Patient Instructions (Signed)
Visit Information  Joan [name], it was nice talking with you today.  I have asked the care guide to contact you about resources for housing, financial and food resources that are available to you: please listen for a call from the care guide  I have asked the social worker to contact you regarding your unmet mental health needs and possible desire to change psychiatric providers: please be listening out for the social worker.  Please read over the attached information, and start now to reduce/ stop smoking  I look forward to talking to you again for an update on Tuesday May 30, 2020 at 1:00 pm- please be listening out for my call that day.  I will call as close to 1:00 pm as possible; I look forward to hearing about your progress.  Please don't hesitate to contact me if I can be of assistance to you before our next scheduled appointment.  Oneta Rack, RN, BSN, [facility] RN Care Coordination- [name] [phone]: direct office [phone]: mobile    PATIENT GOALS: Goals Addressed            This Visit's Progress   . Lifestyle Change-Hypertension   Not on track    Timeframe:  Long-Range Goal Priority:  Medium Start Date:      05/05/20                       Expected End Date:       11/05/20                Follow Up Date 05/30/20   . Agree to work together to make changes . Ask questions to understand . Learn about high blood pressure  . Begin making small changes to your diet to incorporate lowering your salt intake, reducing your sugar intake, and eating more fresh foods instead of prepared meals which are often high in sodium . I have asked the care guide to contact you to discuss resources for food acquisition, financial resources, and housing resources- please listen out for for a call from the care guide   Why is this important?    The changes that you are asked to make may be hard to do.   This is especially true when the changes are  life-long.   Knowing why it is important to you is the first step.   Working on the change with your family or support person helps you not feel alone.   Reward yourself and family or support person when goals are met. This can be an activity you choose like bowling, hiking, biking, swimming or shooting hoops.       . Track and Manage My Symptoms-COPD   Not on track    Timeframe:  Long-Range Goal Priority:  High Start Date:      05/05/20                       Expected End Date:     11/05/20                  Follow Up Date 05/30/20   . Arrange in-home help services: Dr, Sharlet Salina placed a referral to the home health agency [facility]: please listen for a call from [facility] to ensure that services you need are in place . Follow rescue plan if symptoms flare-up  . Please take steps to reduce smoking with the ultimate goal of completely quitting-  this will be imperative in how you do with your breathing from an overall standpoint . Please keep a journal of how many cigarettes you are smoking each day so we can discuss this when we talk again . Please talk to the care guide that will call you about housing resources, financial and food resources available to you; and to the social worker that will call you about your desire to change psychiatric providers, so that you can manage your stress, depression and anxiety: these team members will help you manage the stressful situations you are experiencing, which will help your efforts to quit smoking  . Please contact your pulmonary provider team for any concerns that arise about your breathing/ overall condition/ home O2   Why is this important?    Tracking your symptoms and other information about your health helps your doctor plan your care.   Write down the symptoms, the time of day, what you were doing and what medicine you are taking.   You will soon learn how to manage your symptoms.           Low-Sodium Eating Plan Sodium, which is an element  that makes up salt, helps you maintain a healthy balance of fluids in your body. Too much sodium can increase your blood pressure and cause fluid and waste to be held in your body. Your health care provider or dietitian may recommend following this plan if you have high blood pressure (hypertension), kidney disease, liver disease, or heart failure. Eating less sodium can help lower your blood pressure, reduce swelling, and protect your heart, liver, and kidneys. What are tips for following this plan? Reading food labels  The Nutrition Facts label lists the amount of sodium in one serving of the food. If you eat more than one serving, you must multiply the listed amount of sodium by the number of servings.  Choose foods with less than 140 mg of sodium per serving.  Avoid foods with 300 mg of sodium or more per serving. Shopping  Look for lower-sodium products, often labeled as "low-sodium" or "no salt added."  Always check the sodium content, even if foods are labeled as "unsalted" or "no salt added."  Buy fresh foods. ? Avoid canned foods and pre-made or frozen meals. ? Avoid canned, cured, or processed meats.  Buy breads that have less than 80 mg of sodium per slice.   Cooking  Eat more home-cooked food and less restaurant, buffet, and fast food.  Avoid adding salt when cooking. Use salt-free seasonings or herbs instead of table salt or sea salt. Check with your health care provider or pharmacist before using salt substitutes.  Cook with plant-based oils, such as canola, sunflower, or olive oil.   Meal planning  When eating at a restaurant, ask that your food be prepared with less salt or no salt, if possible. Avoid dishes labeled as brined, pickled, cured, smoked, or made with soy sauce, miso, or teriyaki sauce.  Avoid foods that contain MSG (monosodium glutamate). MSG is sometimes added to [place] food, bouillon, and some canned foods.  Make meals that can be grilled, baked,  poached, roasted, or steamed. These are generally made with less sodium. General information Most people on this plan should limit their sodium intake to 1,500-2,000 mg (milligrams) of sodium each day. What foods should I eat? Fruits Fresh, frozen, or canned fruit. Fruit juice. Vegetables Fresh or frozen vegetables. "No salt added" canned vegetables. "No salt added" tomato sauce and paste. Low-sodium  or reduced-sodium tomato and vegetable juice. Grains Low-sodium cereals, including oats, puffed wheat and rice, and shredded wheat. Low-sodium crackers. Unsalted rice. Unsalted pasta. Low-sodium bread. Whole-grain breads and whole-grain pasta. Meats and other proteins Fresh or frozen (no salt added) meat, poultry, seafood, and fish. Low-sodium canned tuna and salmon. Unsalted nuts. Dried peas, beans, and lentils without added salt. Unsalted canned beans. Eggs. Unsalted nut butters. Dairy Milk. Soy milk. Cheese that is naturally low in sodium, such as ricotta cheese, fresh mozzarella, or Swiss cheese. Low-sodium or reduced-sodium cheese. Cream cheese. Yogurt. Seasonings and condiments Fresh and dried herbs and spices. Salt-free seasonings. Low-sodium mustard and ketchup. Sodium-free salad dressing. Sodium-free light mayonnaise. Fresh or refrigerated horseradish. Lemon juice. Vinegar. Other foods Homemade, reduced-sodium, or low-sodium soups. Unsalted popcorn and pretzels. Low-salt or salt-free chips. The items listed above may not be a complete list of foods and beverages you can eat. Contact a dietitian for more information. What foods should I avoid? Vegetables Sauerkraut, pickled vegetables, and relishes. Olives. [place] fries. Onion rings. Regular canned vegetables (not low-sodium or reduced-sodium). Regular canned tomato sauce and paste (not low-sodium or reduced-sodium). Regular tomato and vegetable juice (not low-sodium or reduced-sodium). Frozen vegetables in sauces. Grains Instant hot  cereals. Bread stuffing, pancake, and biscuit mixes. Croutons. Seasoned rice or pasta mixes. Noodle soup cups. Boxed or frozen macaroni and cheese. Regular salted crackers. Self-rising flour. Meats and other proteins Meat or fish that is salted, canned, smoked, spiced, or pickled. Precooked or cured meat, such as sausages or meat loaves. Berniece Salines. Ham. Pepperoni. Hot dogs. Corned beef. Chipped beef. Salt pork. Jerky. Pickled herring. Anchovies and sardines. Regular canned tuna. Salted nuts. Dairy Processed cheese and cheese spreads. Hard cheeses. Cheese curds. Blue cheese. Feta cheese. String cheese. Regular cottage cheese. Buttermilk. Canned milk. Fats and oils Salted butter. Regular margarine. Ghee. Bacon fat. Seasonings and condiments Onion salt, garlic salt, seasoned salt, table salt, and sea salt. Canned and packaged gravies. Worcestershire sauce. Tartar sauce. Barbecue sauce. Teriyaki sauce. Soy sauce, including reduced-sodium. Steak sauce. Fish sauce. Oyster sauce. Cocktail sauce. Horseradish that you find on the shelf. Regular ketchup and mustard. Meat flavorings and tenderizers. Bouillon cubes. Hot sauce. Pre-made or packaged marinades. Pre-made or packaged taco seasonings. Relishes. Regular salad dressings. Salsa. Other foods Salted popcorn and pretzels. Corn chips and puffs. Potato and tortilla chips. Canned or dried soups. Pizza. Frozen entrees and pot pies. The items listed above may not be a complete list of foods and beverages you should avoid. Contact a dietitian for more information. Summary  Eating less sodium can help lower your blood pressure, reduce swelling, and protect your heart, liver, and kidneys.  Most people on this plan should limit their sodium intake to 1,500-2,000 mg (milligrams) of sodium each day.  Canned, boxed, and frozen foods are high in sodium. Restaurant foods, fast foods, and pizza are also very high in sodium. You also get sodium by adding salt to food.  Try  to cook at home, eat more fresh fruits and vegetables, and eat less fast food and canned, processed, or prepared foods. This information is not intended to replace advice given to you by your health care provider. Make sure you discuss any questions you have with your health care provider. Document Revised: 03/19/2019 Document Reviewed: 01/13/2019 Elsevier Patient Education  2021 [place] of Quitting Smoking Quitting smoking is a physical and mental challenge. You will face cravings, withdrawal symptoms, and temptation. Before quitting, work with your  health care provider to make a plan that can help you manage quitting. Preparation can help you quit and keep you from giving in. How to manage lifestyle changes Managing stress Stress can make you want to smoke, and wanting to smoke may cause stress. It is important to find ways to manage your stress. You might try some of the following:  Practice relaxation techniques. ? Breathe slowly and deeply, in through your nose and out through your mouth. ? Listen to music. ? Soak in a bath or take a shower. ? Imagine a peaceful place or vacation.  Get some support. ? Talk with family or friends about your stress. ? Join a support group. ? Talk with a counselor or therapist.  Get some physical activity. ? Go for a walk, run, or bike ride. ? Play a favorite sport. ? Practice yoga.   Medicines Talk with your health care provider about medicines that might help you deal with cravings and make quitting easier for you. Relationships Social situations can be difficult when you are quitting smoking. To manage this, you can:  Avoid parties and other social situations where people might be smoking.  Avoid alcohol.  Leave right away if you have the urge to smoke.  Explain to your family and friends that you are quitting smoking. Ask for support and let them know you might be a bit grumpy.  Plan activities where  smoking is not an option. General instructions Be aware that many people gain weight after they quit smoking. However, not everyone does. To keep from gaining weight, have a plan in place before you quit and stick to the plan after you quit. Your plan should include:  Having healthy snacks. When you have a craving, it may help to: ? Eat popcorn, carrots, celery, or other cut vegetables. ? Chew sugar-free gum.  Changing how you eat. ? Eat small portion sizes at meals. ? Eat 4-6 small meals throughout the day instead of 1-2 large meals a day. ? Be mindful when you eat. Do not watch television or do other things that might distract you as you eat.  Exercising regularly. ? Make time to exercise each day. If you do not have time for a long workout, do short bouts of exercise for 5-10 minutes several times a day. ? Do some form of strengthening exercise, such as weight lifting. ? Do some exercise that gets your heart beating and causes you to breathe deeply, such as walking fast, running, swimming, or biking. This is very important.  Drinking plenty of water or other low-calorie or no-calorie drinks. Drink 6-8 glasses of water daily.   How to recognize withdrawal symptoms Your body and mind may experience discomfort as you try to get used to not having nicotine in your system. These effects are called withdrawal symptoms. They may include:  Feeling hungrier than normal.  Having trouble concentrating.  Feeling irritable or restless.  Having trouble sleeping.  Feeling depressed.  Craving a cigarette. To manage withdrawal symptoms:  Avoid places, people, and activities that trigger your cravings.  Remember why you want to quit.  Get plenty of sleep.  Avoid coffee and other caffeinated drinks. These may worsen some of your symptoms. These symptoms may surprise you. But be assured that they are normal to have when quitting smoking. How to manage cravings Come up with a plan for how  to deal with your cravings. The plan should include the following:  A definition of the specific situation you  want to deal with.  An alternative action you will take.  A clear idea for how this action will help.  The name of someone who might help you with this. Cravings usually last for 5-10 minutes. Consider taking the following actions to help you with your plan to deal with cravings:  Keep your mouth busy. ? Chew sugar-free gum. ? Suck on hard candies or a straw. ? Brush your teeth.  Keep your hands and body busy. ? Change to a different activity right away. ? Squeeze or play with a ball. ? Do an activity or a hobby, such as making bead jewelry, practicing needlepoint, or working with wood. ? Mix up your normal routine. ? Take a short exercise break. Go for a quick walk or run up and down stairs.  Focus on doing something kind or helpful for someone else.  Call a friend or family member to talk during a craving.  Join a support group.  Contact a quitline. Where to find support To get help or find a support group:  Call the [facility]'s Smoking Quitline: 1-800-QUIT NOW 463-495-4604)  Visit the website of the Substance Abuse and [place]: ktimeonline.com  Text QUIT to SmokefreeTXT: 454098 Where to find more information Visit these websites to find more information on quitting smoking:  [place]: www.smokefree.gov  American Lung Association: www.lung.org  American Cancer Society: www.cancer.org  Centers for Disease Control and Prevention: http://www.wolf.info/  American Heart Association: www.heart.org Contact a health care provider if:  You want to change your plan for quitting.  The medicines you are taking are not helping.  Your eating feels out of control or you cannot sleep. Get help right away if:  You feel depressed or become very anxious. Summary  Quitting smoking is a physical and mental challenge.  You will face cravings, withdrawal symptoms, and temptation to smoke again. Preparation can help you as you go through these challenges.  Try different techniques to manage stress, handle social situations, and prevent weight gain.  You can deal with cravings by keeping your mouth busy (such as by chewing gum), keeping your hands and body busy, calling family or friends, or contacting a quitline for people who want to quit smoking.  You can deal with withdrawal symptoms by avoiding places where people smoke, getting plenty of rest, and avoiding drinks with caffeine. This information is not intended to replace advice given to you by your health care provider. Make sure you discuss any questions you have with your health care provider. Document Revised: 12/01/2018 Document Reviewed: 12/01/2018 Elsevier Patient Education  [place].  Patient verbalizes understanding of instructions provided today and agrees to view in [place].    Telephone follow up appointment with care management team member scheduled for: May 30, 2020 at 1:00 pm  The patient has been provided with contact information for the care management team and has been advised to call with any health related questions or concerns.   Oneta Rack, RN, BSN, [facility] RN Care Coordination- [place] [phone]: direct office [phone]: mobile

## 2020-05-05 NOTE — Assessment & Plan Note (Signed)
Encouraged her to stop smoking again, she has done this before. She does have severe nicotine addiction and this is very difficult. She will think about it but very stressed right now due to social situation so she is not sure she can make an attempt to quit now.

## 2020-05-05 NOTE — Assessment & Plan Note (Signed)
Needs to work with PT so that she can lift oxygen canisters when she moves and will not have help to do this.

## 2020-05-05 NOTE — Assessment & Plan Note (Signed)
Needs home health to assist her with PT to help with her goals of moving she needs to be more independent.
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
## 2020-05-05 NOTE — Assessment & Plan Note (Signed)
Advised she can switch counselors if needed.

## 2020-05-08 DIAGNOSIS — J449 Chronic obstructive pulmonary disease, unspecified: Secondary | ICD-10-CM | POA: Diagnosis not present

## 2020-05-08 DIAGNOSIS — J441 Chronic obstructive pulmonary disease with (acute) exacerbation: Secondary | ICD-10-CM | POA: Diagnosis not present

## 2020-05-08 DIAGNOSIS — J9611 Chronic respiratory failure with hypoxia: Secondary | ICD-10-CM | POA: Diagnosis not present

## 2020-05-09 ENCOUNTER — Telehealth: Payer: Self-pay | Admitting: *Deleted

## 2020-05-09 ENCOUNTER — Telehealth: Payer: Self-pay

## 2020-05-09 NOTE — Telephone Encounter (Signed)
Telephone encounter was:  Unsuccessful.  05/09/2020 Name: Elyssa Hiemenz MRN: [id number] DOB: 04/26/1960  Unsuccessful outbound call made today to assist with:  Housing resources.  Outreach Attempt:  1st Attempt  A HIPAA compliant voice message was left requesting a return call.  Instructed patient to call back at [phone].  Aryonna Gunnerson, AAS Paralegal, [facility] Care Guide . Embedded Care Coordination [facility]  Care Management  [address] [place], [place] [address] ??[email or web address]  ?? [phone]   [email or web address]

## 2020-05-09 NOTE — Telephone Encounter (Signed)
  Chronic Care Management   Outreach Note  05/08/2020 Name: Joan [name] MRN: [id number] DOB: 1960/08/09  Referred by: [name], MD Reason for referral : Chronic Care Management (LCSW Unsuccessful outgoing call)   An unsuccessful telephone outreach was attempted today. The patient was referred to the case management team for assistance with care management and care coordination.   Follow Up Plan: The care management team will reach out to the patient again over the next 2-3 days.   Eduard Clos MSW, LCSW Licensed Clinical Social Worker [facility] [place] [phone]

## 2020-05-10 ENCOUNTER — Ambulatory Visit: Payer: Medicare Other | Admitting: *Deleted

## 2020-05-10 DIAGNOSIS — I10 Essential (primary) hypertension: Secondary | ICD-10-CM | POA: Diagnosis not present

## 2020-05-10 DIAGNOSIS — J449 Chronic obstructive pulmonary disease, unspecified: Secondary | ICD-10-CM | POA: Diagnosis not present
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
## 2020-05-10 NOTE — Progress Notes (Addendum)
Spoke with patient again about a letter she stated she received from [facility]. Spoke with representative Hurley Cisco) who stated that the patient needs a new prescription sent in to the pharmacy at [facility] fax number is [phone]).  Also made a call to [employer] to check on the status of the patient's application for Daliresp and Symbicort. Spoke with representative Karna Christmas) who processed the application over the phone for both medications. The applications were approved through 02/24/21 and a fax will be sent to Dr. Elsworth Soho office and to the clinical pharmacist Mendel Ryder. The representative will also call the patient to set up a time for a shipment to be delivered.   Wendy Poet, [place] [phone]

## 2020-05-10 NOTE — Patient Instructions (Signed)
Visit Information  PATIENT GOALS: Goals Addressed              This Visit's Progress   .  To find my own housing (pt-stated)        Timeframe:  Long-Range Goal Priority:  High Start Date:    05/09/2020                         Expected End Date:   07/25/2020                    Follow Up Date 05/30/2020    - begin a notebook of services in my neighborhood or community and resources provided by [employer], RNCM and CSW - call 911 when I need emergency help - follow-up on any referrals for help I am given and seek housing options on my own in the local paper and community - think ahead to make sure my need does not become an emergency - make a note about what I need to have by the phone or take with me, like an identification card or social security number have a back-up plan - have a back-up plan - make a list of family or friends that I can call  -follow up with counselor and Psychiatrist at scheduled appointment times to further discuss mental health concerns/needs     Why is this important?    Knowing how and where to find help for yourself or family in your neighborhood and community is an important skill.   You will want to take some steps to learn how.    Notes:        The patient verbalized understanding of instructions, educational materials, and care plan provided today and declined offer to receive copy of patient instructions, educational materials, and care plan.   Telephone follow up appointment with care management team member scheduled for:05/30/2020  Eduard Clos MSW, LCSW Licensed Clinical Social Worker [place] [phone]

## 2020-05-11 ENCOUNTER — Telehealth: Payer: Self-pay

## 2020-05-11 DIAGNOSIS — I10 Essential (primary) hypertension: Secondary | ICD-10-CM | POA: Diagnosis not present

## 2020-05-11 DIAGNOSIS — M47812 Spondylosis without myelopathy or radiculopathy, cervical region: Secondary | ICD-10-CM | POA: Diagnosis not present

## 2020-05-11 DIAGNOSIS — J9611 Chronic respiratory failure with hypoxia: Secondary | ICD-10-CM | POA: Diagnosis not present

## 2020-05-11 DIAGNOSIS — M150 Primary generalized (osteo)arthritis: Secondary | ICD-10-CM | POA: Diagnosis not present

## 2020-05-11 DIAGNOSIS — J449 Chronic obstructive pulmonary disease, unspecified: Secondary | ICD-10-CM | POA: Diagnosis not present

## 2020-05-11 NOTE — Telephone Encounter (Signed)
Telephone encounter was:  Successful.  05/11/2020 Name: Joan [name] MRN: [id number] DOB: 12/26/1960  [name] is a 60 y.o. year old female who is a primary care patient of Okey Dupre Austin Miles, MD . The community resource team was consulted for assistance with  Patient has received emailed housing resources and plans on contacting them.  Patient also has my contact information.             Care guide performed the following interventions: Discussed resources to assist with housing Follow up call placed to the patient to discuss status of referral.  Follow Up Plan:  No further follow up planned at this time. The patient has been provided with needed resources.  Penelope Fittro, AAS Paralegal, [facility] Care Guide . Embedded Care Coordination [facility] Health  Care Management  [address] [place], [place] [address] ??[email or web address]  ?? [phone]   [email or web address]

## 2020-05-12 NOTE — Telephone Encounter (Signed)
Re-faxed Rx for Combivent to [facility] as requested.

## 2020-05-14 ENCOUNTER — Other Ambulatory Visit: Payer: Self-pay | Admitting: Pulmonary Disease

## 2020-05-14 ENCOUNTER — Other Ambulatory Visit: Payer: Self-pay | Admitting: Internal Medicine

## 2020-05-14 ENCOUNTER — Other Ambulatory Visit (HOSPITAL_COMMUNITY): Payer: Self-pay | Admitting: Psychiatry

## 2020-05-16 ENCOUNTER — Other Ambulatory Visit (HOSPITAL_COMMUNITY): Payer: Self-pay | Admitting: *Deleted

## 2020-05-16 DIAGNOSIS — I10 Essential (primary) hypertension: Secondary | ICD-10-CM | POA: Diagnosis not present

## 2020-05-16 DIAGNOSIS — M150 Primary generalized (osteo)arthritis: Secondary | ICD-10-CM | POA: Diagnosis not present

## 2020-05-16 DIAGNOSIS — M47812 Spondylosis without myelopathy or radiculopathy, cervical region: Secondary | ICD-10-CM | POA: Diagnosis not present

## 2020-05-16 DIAGNOSIS — J9611 Chronic respiratory failure with hypoxia: Secondary | ICD-10-CM | POA: Diagnosis not present

## 2020-05-16 DIAGNOSIS — J449 Chronic obstructive pulmonary disease, unspecified: Secondary | ICD-10-CM | POA: Diagnosis not present

## 2020-05-16 MED ORDER — QUETIAPINE FUMARATE 50 MG PO TABS: 50.0000 mg | ORAL_TABLET | Freq: Every day | ORAL | 0 refills | Status: DC

## 2020-05-17 ENCOUNTER — Ambulatory Visit (INDEPENDENT_AMBULATORY_CARE_PROVIDER_SITE_OTHER): Payer: Medicare Other | Admitting: Licensed Clinical Social Worker

## 2020-05-17 DIAGNOSIS — F4310 Post-traumatic stress disorder, unspecified: Secondary | ICD-10-CM

## 2020-05-17 DIAGNOSIS — R443 Hallucinations, unspecified: Secondary | ICD-10-CM | POA: Diagnosis not present

## 2020-05-17 DIAGNOSIS — F0630 Mood disorder due to known physiological condition, unspecified: Secondary | ICD-10-CM | POA: Diagnosis not present
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
## 2020-05-18 DIAGNOSIS — J449 Chronic obstructive pulmonary disease, unspecified: Secondary | ICD-10-CM | POA: Diagnosis not present

## 2020-05-18 DIAGNOSIS — M47812 Spondylosis without myelopathy or radiculopathy, cervical region: Secondary | ICD-10-CM | POA: Diagnosis not present

## 2020-05-18 DIAGNOSIS — M150 Primary generalized (osteo)arthritis: Secondary | ICD-10-CM | POA: Diagnosis not present

## 2020-05-18 DIAGNOSIS — J9611 Chronic respiratory failure with hypoxia: Secondary | ICD-10-CM | POA: Diagnosis not present

## 2020-05-18 DIAGNOSIS — I10 Essential (primary) hypertension: Secondary | ICD-10-CM | POA: Diagnosis not present

## 2020-05-19 ENCOUNTER — Telehealth: Payer: Self-pay | Admitting: Internal Medicine

## 2020-05-19 DIAGNOSIS — M150 Primary generalized (osteo)arthritis: Secondary | ICD-10-CM | POA: Diagnosis not present

## 2020-05-19 DIAGNOSIS — I10 Essential (primary) hypertension: Secondary | ICD-10-CM | POA: Diagnosis not present

## 2020-05-19 DIAGNOSIS — M47812 Spondylosis without myelopathy or radiculopathy, cervical region: Secondary | ICD-10-CM | POA: Diagnosis not present

## 2020-05-19 DIAGNOSIS — J9611 Chronic respiratory failure with hypoxia: Secondary | ICD-10-CM | POA: Diagnosis not present

## 2020-05-19 DIAGNOSIS — J449 Chronic obstructive pulmonary disease, unspecified: Secondary | ICD-10-CM | POA: Diagnosis not present

## 2020-05-19 NOTE — Telephone Encounter (Signed)
See below

## 2020-05-19 NOTE — Telephone Encounter (Signed)
[facility] is requesting OT verbals for 1w5. Please advise    Okay to LVM: [phone]

## 2020-05-19 NOTE — Telephone Encounter (Signed)
Ok for verbals 

## 2020-05-22 DIAGNOSIS — M150 Primary generalized (osteo)arthritis: Secondary | ICD-10-CM | POA: Diagnosis not present

## 2020-05-22 DIAGNOSIS — M47812 Spondylosis without myelopathy or radiculopathy, cervical region: Secondary | ICD-10-CM | POA: Diagnosis not present

## 2020-05-22 DIAGNOSIS — J449 Chronic obstructive pulmonary disease, unspecified: Secondary | ICD-10-CM | POA: Diagnosis not present

## 2020-05-22 DIAGNOSIS — I10 Essential (primary) hypertension: Secondary | ICD-10-CM | POA: Diagnosis not present

## 2020-05-22 DIAGNOSIS — J9611 Chronic respiratory failure with hypoxia: Secondary | ICD-10-CM | POA: Diagnosis not present

## 2020-05-22 NOTE — Telephone Encounter (Signed)
Spoke with Sharyn Lull to give verbal orders.

## 2020-05-23 DIAGNOSIS — M150 Primary generalized (osteo)arthritis: Secondary | ICD-10-CM | POA: Diagnosis not present

## 2020-05-23 DIAGNOSIS — J449 Chronic obstructive pulmonary disease, unspecified: Secondary | ICD-10-CM | POA: Diagnosis not present

## 2020-05-23 DIAGNOSIS — M47812 Spondylosis without myelopathy or radiculopathy, cervical region: Secondary | ICD-10-CM | POA: Diagnosis not present

## 2020-05-23 DIAGNOSIS — J9611 Chronic respiratory failure with hypoxia: Secondary | ICD-10-CM | POA: Diagnosis not present

## 2020-05-23 DIAGNOSIS — I10 Essential (primary) hypertension: Secondary | ICD-10-CM | POA: Diagnosis not present

## 2020-05-24 DIAGNOSIS — I10 Essential (primary) hypertension: Secondary | ICD-10-CM | POA: Diagnosis not present

## 2020-05-24 DIAGNOSIS — M47812 Spondylosis without myelopathy or radiculopathy, cervical region: Secondary | ICD-10-CM | POA: Diagnosis not present

## 2020-05-24 DIAGNOSIS — M150 Primary generalized (osteo)arthritis: Secondary | ICD-10-CM | POA: Diagnosis not present

## 2020-05-24 DIAGNOSIS — J449 Chronic obstructive pulmonary disease, unspecified: Secondary | ICD-10-CM | POA: Diagnosis not present

## 2020-05-24 DIAGNOSIS — J9611 Chronic respiratory failure with hypoxia: Secondary | ICD-10-CM | POA: Diagnosis not present

## 2020-05-25 ENCOUNTER — Telehealth (INDEPENDENT_AMBULATORY_CARE_PROVIDER_SITE_OTHER): Payer: Medicare Other | Admitting: Psychiatry

## 2020-05-25 ENCOUNTER — Encounter (HOSPITAL_COMMUNITY): Payer: Self-pay | Admitting: Psychiatry

## 2020-05-25 DIAGNOSIS — F411 Generalized anxiety disorder: Secondary | ICD-10-CM | POA: Diagnosis not present

## 2020-05-25 DIAGNOSIS — F0630 Mood disorder due to known physiological condition, unspecified: Secondary | ICD-10-CM | POA: Diagnosis not present

## 2020-05-25 MED ORDER — BUPROPION HCL ER (XL) 150 MG PO TB24: 150.0000 mg | ORAL_TABLET | Freq: Every day | ORAL | 2 refills | Status: DC

## 2020-05-25 MED ORDER — QUETIAPINE FUMARATE 50 MG PO TABS: 50.0000 mg | ORAL_TABLET | Freq: Every day | ORAL | 2 refills | Status: DC

## 2020-05-25 NOTE — Progress Notes (Signed)
[place] Follow up visit  Patient Identification: Joan [name] MRN:  [id number] Date of Evaluation:  05/25/2020 Referral Source:Therapist [name] Chief Complaint:    depression follow up  Visit Diagnosis:    ICD-10-CM   1. Mood disorder in conditions classified elsewhere  F06.30   2. Generalized anxiety disorder  F41.1       Virtual Visit via Telephone Note  I connected with Joan [name] on 05/25/20 at  3:00 PM EDT by telephone and verified that I am speaking with the correct person using two identifiers.  Location: Patient: home Provider: office   I discussed the limitations, risks, security and privacy concerns of performing an evaluation and management service by telephone and the availability of in person appointments. I also discussed with the patient that there may be a patient responsible charge related to this service. The patient expressed understanding and agreed to proceed.      I discussed the assessment and treatment plan with the patient. The patient was provided an opportunity to ask questions and all were answered. The patient agreed with the plan and demonstrated an understanding of the instructions.   The patient was advised to call back or seek an in-person evaluation if the symptoms worsen or if the condition fails to improve as anticipated.  I provided 14  minutes of non-face-to-face time during this encounter.     History of Present Illness: Patient is a 60  years old currently widow Caucasian female initially referred by her therapist for management of mood symptoms, depression possible PTSD and anxiety  Her brother went up [place], feels calm without him as she feels he puts her down Still sees bugs at night, mostly stays at home denies hopelessness, feels fair but gets upset with confrontation Have lowered wellbutrin last visit as history of seeing bugs,  Still hallucinates but less sporadic She is on seroquel, lyrica, trazadone,  melatonin  Brother  Wants her out of home, she is looking but is stressful ans is contributing to her anxiety She is in therapy Aggravating factor; difficult childhood, grief, difficult relationship with middle kid, hip replacement Modifying factor: siblings but still has difficult time with them   Duration adult life Severity of depression: fair,       Past Psychiatric History: depression, drug use  Previous Psychotropic Medications: Yes   Substance Abuse History in the last 12 months:  No.  Consequences of Substance Abuse: history of extensive drug use uptil 8 years ago and have been in rehab programs  Past Medical History:  Past Medical History:  Diagnosis Date  . Acute respiratory failure with hypoxia ([place])   . Alcoholism and drug addiction in family   . Anxiety   . Appendicitis   . Arthritis    "knees, hips, lower back" (09/25/2016)  . Asthma   . Bipolar disorder ([place])   . Chronic bronchitis ([place])    "all the time" (09/25/2016)  . Chronic leg pain    RLE  . Chronic lower back pain   . COPD (chronic obstructive pulmonary disease) ([place])   . Depression   . Diabetes mellitus without complication ([place])   . Diabetic neuropathy ([place])   . Early cataracts, bilateral   . Emphysema of lung ([place])   . Generalized anxiety disorder 02/09/2020  . GERD (gastroesophageal reflux disease)   . Headache   . Hepatitis C    "treated back in 2001-2002"  . High cholesterol   . Hypertension   . Hypothyroidism   .  Incarcerated ventral hernia 04/04/2017  . Mood disorder in conditions classified elsewhere 02/09/2020  . On home oxygen therapy    "2L; at nighttime" (04/04/2017)  . OSA on CPAP    CPAP with Oxygen 2 liters  . Paranoid ([place])   . Pneumonia    "all the time" (09/25/2016)  . Positive PPD    with negative chest x ray  . PTSD (post-traumatic stress disorder) 02/09/2020  . Tachycardia    patient reports with exertion ; denies any other conditions   . Thyroid disease   . Wears  glasses   . Wears partial dentures     Past Surgical History:  Procedure Laterality Date  . BACK SURGERY    . BIOPSY  11/24/2018   Procedure: BIOPSY;  Surgeon: Thornton Park, MD;  Location: [facility];  Service: Gastroenterology;;  . CARDIAC CATHETERIZATION    . COLONOSCOPY    . COLONOSCOPY WITH PROPOFOL N/A 11/24/2018   Procedure: COLONOSCOPY WITH PROPOFOL;  Surgeon: Thornton Park, MD;  Location: [facility];  Service: Gastroenterology;  Laterality: N/A;  . CYST EXCISION     removal of cyst in sinuses  . DILATION AND CURETTAGE OF UTERUS    . ESOPHAGOGASTRODUODENOSCOPY (EGD) WITH PROPOFOL N/A 11/24/2018   Procedure: ESOPHAGOGASTRODUODENOSCOPY (EGD) WITH PROPOFOL;  Surgeon: Thornton Park, MD;  Location: [facility];  Service: Gastroenterology;  Laterality: N/A;  . FOOT SURGERY Bilateral    bone removed from 4th and 5 th toes and put in pin then took pin out  . HERNIA REPAIR    . INCISION AND DRAINAGE  ~ 2014/2015   "removed mesh & infection"  . INCISIONAL HERNIA REPAIR N/A 04/04/2017   Procedure: LAPAROSCOPIC INCISIONAL HERNIA REPAIR WITH MESH;  Surgeon: Fanny Skates, MD;  Location: [facility];  Service: General;  Laterality: N/A;  . LACERATION REPAIR Right    repair wrist artery from laceration from ice skate  . LAPAROSCOPIC APPENDECTOMY N/A 03/31/2019   Procedure: APPENDECTOMY LAPAROSCOPIC;  Surgeon: Ralene Ok, MD;  Location: [facility];  Service: General;  Laterality: N/A;  . LAPAROSCOPIC APPENDECTOMY N/A 07/09/2019   Procedure: APPENDECTOMY LAPAROSCOPIC;  Surgeon: Ralene Ok, MD;  Location: [facility];  Service: General;  Laterality: N/A;  . LAPAROSCOPIC INCISIONAL / UMBILICAL / [place]  04/04/2017   LAPAROSCOPIC INCISIONAL HERNIA REPAIR WITH MESH  . LIPOMA EXCISION Right    fattty lipoma in neck  . NASAL SEPTUM SURGERY    . POSTERIOR LUMBAR FUSION  2015/2016 X 2   "added rods and screws"  . SINOSCOPY    . TOTAL HIP ARTHROPLASTY Right 02/05/2017    Procedure: RIGHT TOTAL HIP ARTHROPLASTY;  Surgeon: [name], MD;  Location: [facility];  Service: Orthopedics;  Laterality: Right;  . TOTAL HIP ARTHROPLASTY Left 06/18/2017   Procedure: LEFT TOTAL HIP ARTHROPLASTY;  Surgeon: [name], MD;  Location[facility];  Service: Orthopedics;  Laterality: Left;  . TOTAL HIP REVISION Left 01/26/2019   Procedure: TOTAL HIP REVISION;  Surgeon: Paralee Cancel, MD;  Location: [facility];  Service: Orthopedics;  Laterality: Left;  2 hrs  . TUBAL LIGATION    . UMBILICAL HERNIA REPAIR  ~ 2013   w/mesh  . UPPER GI ENDOSCOPY      Family Psychiatric History: FAther : alcohol use  Family History:  Family History  Problem Relation Age of Onset  . Diabetes Mother   . Hypertension Mother   . Hypertension Father   . Cancer Father        lung  Social History:   Social History   Socioeconomic History  . Marital status: Widowed    Spouse name: Not on file  . Number of children: 3  . Years of education: Not on file  . Highest education level: Not on file  Occupational History  . Not on file  Tobacco Use  . Smoking status: Current Every Day Smoker    Packs/day: 1.50    Years: 44.00    Pack years: 66.00    Types: Cigarettes  . Smokeless tobacco: Never Used  . Tobacco comment: down to 0.5ppd 04/21/20  Vaping Use  . Vaping Use: Never used  Substance and Sexual Activity  . Alcohol use: Not Currently  . Drug use: Not Currently    Types: "Crack" cocaine    Comment:  "nothing since 01/12/2012"  . Sexual activity: Not Currently    Partners: Male  Other Topics Concern  . Not on file  Social History Narrative  . Not on file   Social Determinants of Health   Financial Resource Strain: High Risk  . Difficulty of Paying Living Expenses: Hard  Food Insecurity: Food Insecurity Present  . Worried About Charity fundraiser in the Last Year: Sometimes true  . Ran Out of Food in the Last Year: Sometimes true  Transportation Needs: No Transportation  Needs  . Lack of Transportation (Medical): No  . Lack of Transportation (Non-Medical): No  Physical Activity: Not on file  Stress: Stress Concern Present  . Feeling of Stress : Very much  Social Connections: Not on file      Allergies:   Allergies  Allergen Reactions  . Keflex [Cephalexin] Rash and Other (See Comments)    Has tolerated amoxicillin since had keflex  . Prednisone Other (See Comments)    "counter reacts"  . Shellfish Allergy Shortness Of Breath, Nausea And Vomiting and Other (See Comments)    Stomach cramps  . Tetracyclines & Related Anaphylaxis, Swelling and Other (See Comments)    Throat swelling requiring hospitalization  . Dilaudid [Hydromorphone Hcl] Other (See Comments)    "Lethargy"  . Incruse Ellipta [Umeclidinium Bromide] Nausea Only  . Other   . Tuberculin Tests Other (See Comments)    False postive    Metabolic Disorder Labs: Lab Results  Component Value Date   HGBA1C 7.2 (H) 03/07/2020   MPG 146 10/28/2019   MPG 131.24 03/31/2019   No results found for: PROLACTIN Lab Results  Component Value Date   CHOL 141 03/07/2020   TRIG 143.0 03/07/2020   HDL 54.30 03/07/2020   CHOLHDL 3 03/07/2020   VLDL 28.6 03/07/2020   LDLCALC 58 03/07/2020   LDLCALC 62 08/19/2017   Lab Results  Component Value Date   TSH 0.89 10/28/2019    Therapeutic Level Labs: No results found for: LITHIUM No results found for: CBMZ No results found for: VALPROATE  Current Medications: Current Outpatient Medications  Medication Sig Dispense Refill  . acetaminophen (TYLENOL) 500 MG tablet Take 2 tablets (1,000 mg total) by mouth every 8 (eight) hours as needed for mild pain. 30 tablet 0  . acyclovir (ZOVIRAX) 400 MG tablet TAKE 1 TABLET BY MOUTH  TWICE DAILY 180 tablet 1  . albuterol (PROVENTIL) (2.5 MG/3ML) 0.083% nebulizer solution Take 3 mLs (2.5 mg total) by nebulization 2 (two) times daily. And every 6 hours as needed.  DX: J44.9 360 mL 3  . albuterol (VENTOLIN  HFA) 108 (90 Base) MCG/ACT inhaler Inhale 2 puffs into the lungs every 6 (six)  hours as needed for wheezing or shortness of breath. 18 g 3  . atenolol (TENORMIN) 25 MG tablet TAKE 1 TABLET BY MOUTH  DAILY 90 tablet 3  . atorvastatin (LIPITOR) 20 MG tablet TAKE 1 TABLET BY MOUTH  DAILY 90 tablet 3  . budesonide-formoterol (SYMBICORT) 160-4.5 MCG/ACT inhaler Inhale 2 puffs into the lungs 2 (two) times daily. 2 Inhaler 0  . buPROPion (WELLBUTRIN XL) 150 MG 24 hr tablet Take 1 tablet (150 mg total) by mouth daily. 30 tablet 2  . Camphor-Menthol-Methyl Sal 3.03-02-08 % PTCH     . cyclobenzaprine (FLEXERIL) 10 MG tablet TAKE 1 TABLET BY MOUTH AT  BEDTIME 90 tablet 1  . diazepam (VALIUM) 5 MG tablet Take oral route 45 minutes prior to procedure. Repeat PRN 20 minutes.    . diazepam (VALIUM) 5 MG tablet Take by mouth.    . hydrochlorothiazide (HYDRODIURIL) 25 MG tablet TAKE 1 TABLET BY MOUTH  DAILY 90 tablet 3  . Ipratropium-Albuterol (COMBIVENT RESPIMAT) 20-100 MCG/ACT AERS respimat Inhale 1 puff into the lungs every 6 (six) hours as needed for wheezing. (Patient taking differently: Inhale 1 puff into the lungs in the morning and at bedtime. Take additional puff if needed at bedtime) 3 Inhaler 3  . levothyroxine (SYNTHROID) 25 MCG tablet TAKE 1 TABLET BY MOUTH  DAILY BEFORE BREAKFAST 90 tablet 3  . Liniments (SALONPAS ARTHRITIS PAIN RELIEF EX) Apply 1 patch topically daily as needed (to painful sites- remove as directed).     . Melatonin 10 MG TABS Take 10 mg by mouth at bedtime.    . montelukast (SINGULAIR) 10 MG tablet TAKE 1 TABLET BY MOUTH AT  BEDTIME 90 tablet 3  . naproxen (NAPROSYN) 500 MG tablet TAKE 1 TABLET BY MOUTH  TWICE DAILY WITH A MEAL 180 tablet 3  . nystatin-triamcinolone ointment (MYCOLOG) Apply 1 application topically 2 (two) times daily as needed (applied to skin folds/groins/under breast skin irritation rash.). (Patient taking differently: Apply 1 application topically 2 (two) times daily  as needed (to skin folds/groin/under breast skin- for irritation/rashes).) 100 g 3  . OXYGEN Inhale 2-3 L/min into the lungs See admin instructions. Inhale  2-3 L/min of oxygen into the lungs w/CPAP at bedtime and as needed for shortness of breath with "strenuous activity" during the day    . pantoprazole (PROTONIX) 40 MG tablet TAKE 1 TABLET BY MOUTH  DAILY 90 tablet 3  . PATADAY 0.2 % SOLN Apply 1 drop to eye daily.    . polyethylene glycol (MIRALAX / GLYCOLAX) 17 g packet Take 17 g by mouth daily as needed for mild constipation. 28 packet 0  . pregabalin (LYRICA) 150 MG capsule Take 1 capsule (150 mg total) by mouth at bedtime. 90 capsule 0  . QUEtiapine (SEROQUEL) 50 MG tablet Take 1 tablet (50 mg total) by mouth at bedtime. 30 tablet 2  . roflumilast (DALIRESP) 500 MCG TABS tablet Take 1 tablet (500 mcg total) by mouth daily. 90 tablet 3  . traZODone (DESYREL) 100 MG tablet TAKE 1 TABLET BY MOUTH AT  BEDTIME 90 tablet 3  . venlafaxine XR (EFFEXOR-XR) 150 MG 24 hr capsule TAKE 1 CAPSULE BY MOUTH  DAILY WITH BREAKFAST 90 capsule 3   No current facility-administered medications for this visit.     Psychiatric Specialty Exam: Review of Systems  Cardiovascular: Negative for chest pain.  Psychiatric/Behavioral: Negative for substance abuse and suicidal ideas.    There were no vitals taken for this visit.There is no height  or weight on file to calculate BMI.  General Appearance:  Eye Contact:   Speech:  Slow  Volume:  Decreased  Mood:fair  Affect:  Congruent  Thought Process:  Goal Directed  Orientation:  Full (Time, Place, and Person)  Thought Content: linear  Suicidal Thoughts:  No  Homicidal Thoughts:  No  Memory:  Immediate;   Fair Recent;   Fair  Judgement:  Fair  Insight:  Shallow  Psychomotor Activity:  Decreased  Concentration:  Concentration: Fair and Attention Span: Fair  Recall:  [employer] of Knowledge:Fair  Language: Fair  Akathisia:  No  Handed:    AIMS (if  indicated): no involuntary movements  Assets:  Desire for Improvement Leisure Time Social Support  ADL's: limited due to recent surgery  Cognition: WNL  Sleep:  variable   Screenings: Mini-Mental   Flowsheet Row Clinical Support from 04/06/2018 in [facility]  Total Score (max 30 points ) 29    PHQ2-9   Flowsheet Row Chronic Care Management from 05/05/2020 in [facility] [place] from 05/24/2019 in [facility] Patient Outreach Telephone from 12/18/2018 in [facility] Patient Outreach Telephone from 11/18/2018 in [facility] from 04/06/2018 in [facility]  PHQ-2 Total Score 2 2 3 1 2   PHQ-9 Total Score 9 13 -- -- 6    Flowsheet Row Video Visit from 04/13/2020 in [facility] No Risk      Assessment and Plan: as follows Prior documentation reviewed  Mood disorder unspecified: relavant to multiple medical concerns and past history of using drugs, alcohol Remains sober, mood is fair, meds keep balance but gets effected by brother or confrontation    Hallucinations may be part of grief, PTSD or depression or possible part of schizoaffective disorder.  Sporadic hallucinations, continue seroquel and work on distractions   Fu 37m.    [name], MD 3/31/20223:36 PM

## 2020-05-29 DIAGNOSIS — J9611 Chronic respiratory failure with hypoxia: Secondary | ICD-10-CM | POA: Diagnosis not present

## 2020-05-29 DIAGNOSIS — M47812 Spondylosis without myelopathy or radiculopathy, cervical region: Secondary | ICD-10-CM | POA: Diagnosis not present

## 2020-05-29 DIAGNOSIS — I10 Essential (primary) hypertension: Secondary | ICD-10-CM | POA: Diagnosis not present

## 2020-05-29 DIAGNOSIS — J449 Chronic obstructive pulmonary disease, unspecified: Secondary | ICD-10-CM | POA: Diagnosis not present

## 2020-05-29 DIAGNOSIS — M150 Primary generalized (osteo)arthritis: Secondary | ICD-10-CM | POA: Diagnosis not present

## 2020-05-30 ENCOUNTER — Telehealth: Payer: Self-pay | Admitting: Acute Care

## 2020-05-30 ENCOUNTER — Ambulatory Visit (INDEPENDENT_AMBULATORY_CARE_PROVIDER_SITE_OTHER): Payer: Medicare Other | Admitting: *Deleted

## 2020-05-30 ENCOUNTER — Ambulatory Visit: Payer: Medicare Other | Admitting: *Deleted

## 2020-05-30 DIAGNOSIS — J449 Chronic obstructive pulmonary disease, unspecified: Secondary | ICD-10-CM

## 2020-05-30 DIAGNOSIS — F411 Generalized anxiety disorder: Secondary | ICD-10-CM

## 2020-05-30 DIAGNOSIS — J9611 Chronic respiratory failure with hypoxia: Secondary | ICD-10-CM

## 2020-05-30 DIAGNOSIS — I10 Essential (primary) hypertension: Secondary | ICD-10-CM | POA: Diagnosis not present

## 2020-05-30 DIAGNOSIS — G4733 Obstructive sleep apnea (adult) (pediatric): Secondary | ICD-10-CM

## 2020-05-30 DIAGNOSIS — E118 Type 2 diabetes mellitus with unspecified complications: Secondary | ICD-10-CM

## 2020-05-30 DIAGNOSIS — F0630 Mood disorder due to known physiological condition, unspecified: Secondary | ICD-10-CM

## 2020-05-30 DIAGNOSIS — M150 Primary generalized (osteo)arthritis: Secondary | ICD-10-CM | POA: Diagnosis not present

## 2020-05-30 DIAGNOSIS — M47812 Spondylosis without myelopathy or radiculopathy, cervical region: Secondary | ICD-10-CM | POA: Diagnosis not present
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
## 2020-05-30 NOTE — Telephone Encounter (Signed)
Called and spoke with patient to let her know that we are going to send in RX to DME to add humidification to her oxygen. She expressed understanding. Nothing further needed at this time.

## 2020-05-30 NOTE — Telephone Encounter (Signed)
Spoke with the pt  She is c/o dry nasal passages- relates to o2 use  She requests that we order humidifier bottle for o2  Please advise if ok to send order to [employer] for this, thanks

## 2020-05-30 NOTE — Patient Instructions (Addendum)
Visit Information  Joan [name], it was nice talking with you today.   Please read over the attached information, and start now to decrease/ stop smoking: this is the single most important thing you can do to improve your health   I look forward to talking to you again for an update on Monday Jul 03, 2020 at 1:00 pm- please be listening out for my call that day.  I will call as close to 1:00 pm as possible; I look forward to hearing about your progress.   Please don't hesitate to contact me if I can be of assistance to you before our next scheduled appointment.   Oneta Rack, RN, BSN, [facility] RN Care Coordination- [place] [phone]: direct office [phone]: mobile    PATIENT GOALS: Goals Addressed            This Visit's Progress   . Lifestyle Change-Hypertension   On track    Timeframe:  Long-Range Goal Priority:  Medium Start Date:      05/05/20                       Expected End Date:       11/05/20                Follow Up Date 07/03/20   . Agree to work together with your care team to begin making lifestyle changes to improve blood pressure; ask questions to understand why this important if you don't understand . Learn about high blood pressure, and please try to monitor/ record your blood pressure about once a week.  When the home health team takes your blood pressure, please write it down so we can review during our phone calls . Continue making small changes to your diet to incorporate lowering your salt intake, reducing your sugar intake, and eating more fresh foods instead of prepared meals which are often high in sodium . I am glad you spoke with the care guide to discuss resources for food acquisition, financial resources, and housing resources- please take steps to follow through on the resources that were provided to you when you spoke with the care guide . Please decrease and eventually stop smoking- this will help both your breathing  and your blood pressure get better   Why is this important?    The changes that you are asked to make may be hard to do.   This is especially true when the changes are life-long.   Knowing why it is important to you is the first step.   Working on the change with your family or support person helps you not feel alone.   Reward yourself and family or support person when goals are met. This can be an activity you choose like bowling, hiking, biking, swimming or shooting hoops.       . Track and Manage My Symptoms-COPD   On track    Timeframe:  Long-Range Goal Priority:  High Start Date:      05/05/20                       Expected End Date:     11/05/20                  Follow Up Date 07/03/20   . Continue working with the home health agency [facility] . Follow rescue plan if symptoms flare-up- if you have trouble with your  breathing, call your lung doctor right away . Please take steps to reduce smoking with the ultimate goal of completely quitting- AGAIN- this is imperative in how your breathing is, from an overall standpoint . Please try to keep a journal of how many cigarettes you are smoking each day so we can discuss this when we talk again . I am glad that you talked with the care guide and the social worker about housing resources, financial and food resources available to you; and to make sure you are working with your psychiatric providers, properly managing your stress, depression, and anxiety should also help you quit smoking . You have an appointment with your pulmonary provider team (Dr. Elsworth Soho) on Jun 30, 2020 at 11:45 am- please talk to your pulmonary team about your desire for light-weight portable oxygen tanks and your efforts to quit smoking   Why is this important?    Tracking your symptoms and other information about your health helps your doctor plan your care.   Write down the symptoms, the time of day, what you were doing and what medicine you are taking.   You will  soon learn how to manage your symptoms.            American Journal of Respiratory and [place], [phone]), e5-e31. http://knight.com/.622297-9892JJ">  Health Risks of Smoking Smoking tobacco is very bad for your health. Tobacco smoke contains many toxic chemicals that can damage every part of your body. Secondhand smoke can be harmful to those around you. Tobacco or nicotine use can cause many long-term (chronic) diseases. Smoking is difficult to quit because a chemical in tobacco, called nicotine, causes addiction or dependence. When you smoke and inhale, nicotine is absorbed quickly into the bloodstream through your lungs. Both inhaled and non-inhaled nicotine may be addictive. How can quitting affect me? There are health benefits of quitting smoking. Some benefits happen right away and others take time. Benefits may include:  Blood flow, blood pressure, heart rate, and lung capacity may begin to improve. However, any lung damage that has already occurred cannot be repaired.  Temporary respiratory symptoms, such as nasal congestion and cough, may improve over time.  Your risk of heart disease, stroke, and cancer is reduced.  The overall quality of your health may improve.  You may save money, as you will not spend money on tobacco products and may spend less money on smoking-related health issues. What can increase my risk? Smoking harms nearly every organ in the body. People who smoke tobacco have a shorter life expectancy and an increased risk of many serious medical problems. These include:  More respiratory infections, such as colds and pneumonia.  Cancer.  Heart disease.  Stroke.  Chronic respiratory diseases.  Delayed wound healing and increased risk of complications during surgery.  Problems with reproduction, pregnancy, and childbirth, such as infertility, early (premature) births, stillbirths, and birth defects. Secondhand smoke exposure to  children increases the risk of:  Sudden infant death syndrome (SIDS).  Infections in the nose, throat, or airways (respiratory infections).  Chronic respiratory symptoms.   What actions can I take to quit? Smoking is an addiction that affects both your body and your mind, and long-time habits can be hard to change. Your health care provider can recommend:  Nicotine replacement products, such as patches, gum, and nasal sprays. Use these products only as directed. Do not replace cigarette smoking with electronic cigarettes, which are commonly called e-cigarettes. The safety of e-cigarettes is not known, and  some may contain harmful chemicals.  Programs and community resources, which may include group support, education, or talk therapy.  Prescription medicines to help reduce cravings.  A combination of two or more quit methods, which will increase the success of quitting.   Where to find support Follow the recommendations from your health care provider about support groups and other assistance. You can also visit:  [employer]: www.naquitline.org or call 1-800-QUIT-NOW.  U.S. Department of Health and Human Services: www.smokefree.gov  American Lung Association: www.freedomfromsmoking.org  American Heart Association: www.heart.org Where to find more information  Centers for Disease Control and Prevention: http://www.wolf.info/  World Health Organization: RoleLink.com.br Summary  Smoking tobacco is very bad for your health. Tobacco smoke contains many toxic chemicals that can damage every part of the body.  Smoking is difficult to quit because a chemical in tobacco, called nicotine, causes addiction or dependence.  There are immediate and long-term health benefits of quitting smoking.  A combination of two or more quit methods increases the success of quitting. This information is not intended to replace advice given to you by your health care provider. Make sure you  discuss any questions you have with your health care provider. Document Revised: 03/29/2019 Document Reviewed: 03/29/2019 Elsevier Patient Education  2021 [place].    Patient verbalizes understanding of instructions provided today and agrees to view in [place].   Telephone follow up appointment with care management team member scheduled for: 07/03/20 at 1:00 pm  The patient has been provided with contact information for the care management team and has been advised to call with any health related questions or concerns.   Oneta Rack, RN, BSN, [facility] RN Care Coordination- [place] [phone]: direct office [phone]: mobile

## 2020-05-30 NOTE — Telephone Encounter (Signed)
Okay to send prescription for humidifier

## 2020-05-31 ENCOUNTER — Ambulatory Visit (INDEPENDENT_AMBULATORY_CARE_PROVIDER_SITE_OTHER): Payer: Medicare Other | Admitting: Licensed Clinical Social Worker

## 2020-05-31 DIAGNOSIS — R443 Hallucinations, unspecified: Secondary | ICD-10-CM | POA: Diagnosis not present

## 2020-05-31 DIAGNOSIS — F4310 Post-traumatic stress disorder, unspecified: Secondary | ICD-10-CM | POA: Diagnosis not present

## 2020-05-31 DIAGNOSIS — F0630 Mood disorder due to known physiological condition, unspecified: Secondary | ICD-10-CM | POA: Diagnosis not present

## 2020-05-31 DIAGNOSIS — F411 Generalized anxiety disorder: Secondary | ICD-10-CM
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
## 2020-05-31 NOTE — Patient Instructions (Signed)
Visit Information  PATIENT GOALS: Goals Addressed              This Visit's Progress   .  "to work on me" (pt-stated)        Timeframe:  Long-Range Goal Priority:  High Start Date:    05/09/2020                         Expected End Date:   07/25/2020                    Follow Up Date 06/21/2020  -ask sister and others to assist with paperwork for housing applications  - begin a notebook of services in my neighborhood or community and resources provided by [employer], RNCM and CSW - call 911 when I need emergency help - follow-up on any referrals for help I am given and seek housing options on my own in the local paper and community - think ahead to make sure my need does not become an emergency - make a note about what I need to have by the phone or take with me, like an identification card or social security number have a back-up plan - have a back-up plan - make a list of family or friends that I can call  -follow up with counselor and Psychiatrist at scheduled appointment times to further discuss mental health concerns/needs -make Neurologist aware of concerns related to memory issues, recent stuttering,etc      Why is this important?    Knowing how and where to find help for yourself or family in your neighborhood and community is an important skill.   You will want to take some steps to learn how.    Notes:        The patient verbalized understanding of instructions, educational materials, and care plan provided today and declined offer to receive copy of patient instructions, educational materials, and care plan.   Telephone follow up appointment with care management team member scheduled for:06/21/2020  Eduard Clos MSW, LCSW Licensed Clinical Social Worker [place] [phone]
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
## 2020-06-01 DIAGNOSIS — M47812 Spondylosis without myelopathy or radiculopathy, cervical region: Secondary | ICD-10-CM | POA: Diagnosis not present

## 2020-06-01 DIAGNOSIS — J449 Chronic obstructive pulmonary disease, unspecified: Secondary | ICD-10-CM | POA: Diagnosis not present

## 2020-06-01 DIAGNOSIS — E1169 Type 2 diabetes mellitus with other specified complication: Secondary | ICD-10-CM

## 2020-06-01 DIAGNOSIS — Z9981 Dependence on supplemental oxygen: Secondary | ICD-10-CM

## 2020-06-01 DIAGNOSIS — R911 Solitary pulmonary nodule: Secondary | ICD-10-CM

## 2020-06-01 DIAGNOSIS — G4733 Obstructive sleep apnea (adult) (pediatric): Secondary | ICD-10-CM

## 2020-06-01 DIAGNOSIS — J383 Other diseases of vocal cords: Secondary | ICD-10-CM

## 2020-06-01 DIAGNOSIS — J9611 Chronic respiratory failure with hypoxia: Secondary | ICD-10-CM | POA: Diagnosis not present

## 2020-06-01 DIAGNOSIS — E669 Obesity, unspecified: Secondary | ICD-10-CM

## 2020-06-01 DIAGNOSIS — F411 Generalized anxiety disorder: Secondary | ICD-10-CM

## 2020-06-01 DIAGNOSIS — Z9181 History of falling: Secondary | ICD-10-CM

## 2020-06-01 DIAGNOSIS — Z791 Long term (current) use of non-steroidal anti-inflammatories (NSAID): Secondary | ICD-10-CM

## 2020-06-01 DIAGNOSIS — Z7951 Long term (current) use of inhaled steroids: Secondary | ICD-10-CM

## 2020-06-01 DIAGNOSIS — F4310 Post-traumatic stress disorder, unspecified: Secondary | ICD-10-CM

## 2020-06-01 DIAGNOSIS — J309 Allergic rhinitis, unspecified: Secondary | ICD-10-CM

## 2020-06-01 DIAGNOSIS — F17200 Nicotine dependence, unspecified, uncomplicated: Secondary | ICD-10-CM

## 2020-06-01 DIAGNOSIS — E039 Hypothyroidism, unspecified: Secondary | ICD-10-CM

## 2020-06-01 DIAGNOSIS — M150 Primary generalized (osteo)arthritis: Secondary | ICD-10-CM | POA: Diagnosis not present

## 2020-06-01 DIAGNOSIS — I10 Essential (primary) hypertension: Secondary | ICD-10-CM

## 2020-06-01 DIAGNOSIS — E785 Hyperlipidemia, unspecified: Secondary | ICD-10-CM

## 2020-06-01 DIAGNOSIS — G252 Other specified forms of tremor: Secondary | ICD-10-CM

## 2020-06-01 DIAGNOSIS — I2510 Atherosclerotic heart disease of native coronary artery without angina pectoris: Secondary | ICD-10-CM

## 2020-06-01 DIAGNOSIS — G451 Carotid artery syndrome (hemispheric): Secondary | ICD-10-CM

## 2020-06-01 DIAGNOSIS — K219 Gastro-esophageal reflux disease without esophagitis: Secondary | ICD-10-CM

## 2020-06-01 DIAGNOSIS — Z6832 Body mass index (BMI) 32.0-32.9, adult: Secondary | ICD-10-CM

## 2020-06-01 DIAGNOSIS — D508 Other iron deficiency anemias: Secondary | ICD-10-CM

## 2020-06-05 DIAGNOSIS — M47812 Spondylosis without myelopathy or radiculopathy, cervical region: Secondary | ICD-10-CM | POA: Diagnosis not present

## 2020-06-06 DIAGNOSIS — M47812 Spondylosis without myelopathy or radiculopathy, cervical region: Secondary | ICD-10-CM | POA: Diagnosis not present

## 2020-06-06 DIAGNOSIS — J449 Chronic obstructive pulmonary disease, unspecified: Secondary | ICD-10-CM | POA: Diagnosis not present

## 2020-06-06 DIAGNOSIS — I10 Essential (primary) hypertension: Secondary | ICD-10-CM | POA: Diagnosis not present

## 2020-06-06 DIAGNOSIS — J9611 Chronic respiratory failure with hypoxia: Secondary | ICD-10-CM | POA: Diagnosis not present

## 2020-06-06 DIAGNOSIS — M150 Primary generalized (osteo)arthritis: Secondary | ICD-10-CM | POA: Diagnosis not present

## 2020-06-07 ENCOUNTER — Telehealth: Payer: Self-pay | Admitting: *Deleted

## 2020-06-07 ENCOUNTER — Other Ambulatory Visit: Payer: Self-pay | Admitting: *Deleted

## 2020-06-07 DIAGNOSIS — F0630 Mood disorder due to known physiological condition, unspecified: Secondary | ICD-10-CM

## 2020-06-07 DIAGNOSIS — F419 Anxiety disorder, unspecified: Secondary | ICD-10-CM

## 2020-06-07 DIAGNOSIS — F4310 Post-traumatic stress disorder, unspecified: Secondary | ICD-10-CM
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
## 2020-06-08 DIAGNOSIS — M150 Primary generalized (osteo)arthritis: Secondary | ICD-10-CM | POA: Diagnosis not present

## 2020-06-08 DIAGNOSIS — J441 Chronic obstructive pulmonary disease with (acute) exacerbation: Secondary | ICD-10-CM | POA: Diagnosis not present

## 2020-06-08 DIAGNOSIS — J449 Chronic obstructive pulmonary disease, unspecified: Secondary | ICD-10-CM | POA: Diagnosis not present

## 2020-06-08 DIAGNOSIS — I10 Essential (primary) hypertension: Secondary | ICD-10-CM | POA: Diagnosis not present

## 2020-06-08 DIAGNOSIS — J9611 Chronic respiratory failure with hypoxia: Secondary | ICD-10-CM | POA: Diagnosis not present

## 2020-06-08 DIAGNOSIS — M47812 Spondylosis without myelopathy or radiculopathy, cervical region: Secondary | ICD-10-CM | POA: Diagnosis not present

## 2020-06-08 NOTE — Addendum Note (Signed)
Addended by: Deirdre Peer on: 06/08/2020 09:26 AM   Modules accepted: Orders

## 2020-06-12 DIAGNOSIS — I10 Essential (primary) hypertension: Secondary | ICD-10-CM | POA: Diagnosis not present

## 2020-06-12 DIAGNOSIS — J9611 Chronic respiratory failure with hypoxia: Secondary | ICD-10-CM | POA: Diagnosis not present

## 2020-06-12 DIAGNOSIS — J449 Chronic obstructive pulmonary disease, unspecified: Secondary | ICD-10-CM | POA: Diagnosis not present

## 2020-06-12 DIAGNOSIS — M47812 Spondylosis without myelopathy or radiculopathy, cervical region: Secondary | ICD-10-CM | POA: Diagnosis not present

## 2020-06-12 DIAGNOSIS — M150 Primary generalized (osteo)arthritis: Secondary | ICD-10-CM | POA: Diagnosis not present

## 2020-06-14 ENCOUNTER — Ambulatory Visit (INDEPENDENT_AMBULATORY_CARE_PROVIDER_SITE_OTHER): Payer: Medicare Other | Admitting: Licensed Clinical Social Worker

## 2020-06-14 DIAGNOSIS — F4310 Post-traumatic stress disorder, unspecified: Secondary | ICD-10-CM

## 2020-06-14 DIAGNOSIS — F411 Generalized anxiety disorder: Secondary | ICD-10-CM

## 2020-06-14 DIAGNOSIS — R443 Hallucinations, unspecified: Secondary | ICD-10-CM | POA: Diagnosis not present

## 2020-06-14 DIAGNOSIS — F0630 Mood disorder due to known physiological condition, unspecified: Secondary | ICD-10-CM | POA: Diagnosis not present
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
## 2020-06-15 ENCOUNTER — Other Ambulatory Visit: Payer: Self-pay | Admitting: Pulmonary Disease

## 2020-06-15 DIAGNOSIS — M150 Primary generalized (osteo)arthritis: Secondary | ICD-10-CM | POA: Diagnosis not present

## 2020-06-15 DIAGNOSIS — J9611 Chronic respiratory failure with hypoxia: Secondary | ICD-10-CM | POA: Diagnosis not present

## 2020-06-15 DIAGNOSIS — I10 Essential (primary) hypertension: Secondary | ICD-10-CM | POA: Diagnosis not present

## 2020-06-15 DIAGNOSIS — M47812 Spondylosis without myelopathy or radiculopathy, cervical region: Secondary | ICD-10-CM | POA: Diagnosis not present

## 2020-06-15 DIAGNOSIS — J449 Chronic obstructive pulmonary disease, unspecified: Secondary | ICD-10-CM | POA: Diagnosis not present

## 2020-06-20 DIAGNOSIS — J449 Chronic obstructive pulmonary disease, unspecified: Secondary | ICD-10-CM | POA: Diagnosis not present

## 2020-06-20 DIAGNOSIS — I10 Essential (primary) hypertension: Secondary | ICD-10-CM | POA: Diagnosis not present

## 2020-06-20 DIAGNOSIS — J9611 Chronic respiratory failure with hypoxia: Secondary | ICD-10-CM | POA: Diagnosis not present

## 2020-06-20 DIAGNOSIS — M47812 Spondylosis without myelopathy or radiculopathy, cervical region: Secondary | ICD-10-CM | POA: Diagnosis not present

## 2020-06-20 DIAGNOSIS — M150 Primary generalized (osteo)arthritis: Secondary | ICD-10-CM | POA: Diagnosis not present

## 2020-06-21 ENCOUNTER — Ambulatory Visit: Payer: Medicare Other | Admitting: *Deleted

## 2020-06-21 DIAGNOSIS — M150 Primary generalized (osteo)arthritis: Secondary | ICD-10-CM | POA: Diagnosis not present

## 2020-06-21 DIAGNOSIS — J449 Chronic obstructive pulmonary disease, unspecified: Secondary | ICD-10-CM | POA: Diagnosis not present

## 2020-06-21 DIAGNOSIS — F411 Generalized anxiety disorder: Secondary | ICD-10-CM

## 2020-06-21 DIAGNOSIS — J9611 Chronic respiratory failure with hypoxia: Secondary | ICD-10-CM | POA: Diagnosis not present

## 2020-06-21 DIAGNOSIS — I10 Essential (primary) hypertension: Secondary | ICD-10-CM | POA: Diagnosis not present

## 2020-06-21 DIAGNOSIS — M47812 Spondylosis without myelopathy or radiculopathy, cervical region: Secondary | ICD-10-CM | POA: Diagnosis not present

## 2020-06-21 DIAGNOSIS — E118 Type 2 diabetes mellitus with unspecified complications: Secondary | ICD-10-CM | POA: Diagnosis not present

## 2020-06-22 DIAGNOSIS — M47812 Spondylosis without myelopathy or radiculopathy, cervical region: Secondary | ICD-10-CM | POA: Diagnosis not present

## 2020-06-22 DIAGNOSIS — M150 Primary generalized (osteo)arthritis: Secondary | ICD-10-CM | POA: Diagnosis not present

## 2020-06-22 DIAGNOSIS — I10 Essential (primary) hypertension: Secondary | ICD-10-CM | POA: Diagnosis not present

## 2020-06-22 DIAGNOSIS — J449 Chronic obstructive pulmonary disease, unspecified: Secondary | ICD-10-CM | POA: Diagnosis not present

## 2020-06-22 DIAGNOSIS — J9611 Chronic respiratory failure with hypoxia: Secondary | ICD-10-CM | POA: Diagnosis not present

## 2020-06-22 NOTE — Patient Instructions (Signed)
Visit Information  PATIENT GOALS: Goals Addressed              This Visit's Progress   .  "to work on me" (pt-stated)        Timeframe:  Long-Range Goal Priority:  High Start Date:    05/09/2020                         Expected End Date:   07/25/2020                    Follow Up Date 07/04/2020  -ask sister and others to assist with paperwork for housing applications  - begin a notebook of services in my neighborhood or community and resources provided by [employer], RNCM and CSW - call 911 when I need emergency help - follow-up on any referrals for help I am given and seek housing options on my own in the local paper and community - think ahead to make sure my need does not become an emergency - make a note about what I need to have by the phone or take with me, like an identification card or social security number have a back-up plan - have a back-up plan - make a list of family or friends that I can call  -follow up with counselor and Psychiatrist at scheduled appointment times to further discuss mental health concerns/needs -make Neurologist aware of concerns related to memory issues, recent stuttering,etc      Why is this important?    Knowing how and where to find help for yourself or family in your neighborhood and community is an important skill.   You will want to take some steps to learn how.    Notes:        The patient verbalized understanding of instructions, educational materials, and care plan provided today and declined offer to receive copy of patient instructions, educational materials, and care plan.   Telephone follow up appointment with care management team member scheduled for:07/04/20   Visit Information  PATIENT GOALS: Goals Addressed              This Visit's Progress   .  "to work on me" (pt-stated)        Timeframe:  Long-Range Goal Priority:  High Start Date:    05/09/2020                         Expected End Date:   07/25/2020                     Follow Up Date 07/04/2020  -ask sister and others to assist with paperwork for housing applications  - begin a notebook of services in my neighborhood or community and resources provided by [employer], RNCM and CSW - call 911 when I need emergency help - follow-up on any referrals for help I am given and seek housing options on my own in the local paper and community - think ahead to make sure my need does not become an emergency - make a note about what I need to have by the phone or take with me, like an identification card or social security number have a back-up plan - have a back-up plan - make a list of family or friends that I can call  -follow up with counselor and Psychiatrist at scheduled appointment times to further discuss mental health concerns/needs -make  Neurologist aware of concerns related to memory issues, recent stuttering,etc      Why is this important?    Knowing how and where to find help for yourself or family in your neighborhood and community is an important skill.   You will want to take some steps to learn how.    Notes:       The patient verbalized understanding of instructions, educational materials, and care plan provided today and declined offer to receive copy of patient instructions, educational materials, and care plan.   Telephone follow up appointment with care management team member scheduled for:07/04/2020 Eduard Clos MSW, [place] Licensed Clinical Social Worker [place] [phone]

## 2020-06-22 NOTE — Chronic Care Management (AMB) (Signed)
Chronic Care Management    Clinical Social Work Note  06/22/2020 Name: Joan [name] MRN: [id number] DOB: August 25, 1960  Joan [name] is a 60 y.o. year old female who is a primary care patient of Sharlet Salina Real Cons, MD. The CCM team was consulted to assist the patient with chronic disease management and/or care coordination needs related to: [employer] , Mental Health Counseling and Resources and Financial Difficulties related to housing options.  Engaged with patient by telephone for follow up visit in response to provider referral for social work chronic care management and care coordination services.  Consent to Services:  The patient was given information about Chronic Care Management services, agreed to services, and gave verbal consent prior to initiation of services.  Please see initial visit note for detailed documentation.  Patient agreed to services and consent obtained.  Assessment: Review of patient past medical history, allergies, medications, and health status, including review of relevant consultants reports was performed today as part of a comprehensive evaluation and provision of chronic care management and care coordination services.    SDOH (Social Determinants of Health) assessments and interventions performed:   Advanced Directives Status: Not addressed in this encounter.  CCM Care Plan  Allergies  Allergen Reactions  . Keflex [Cephalexin] Rash and Other (See Comments)    Has tolerated amoxicillin since had keflex  . Prednisone Other (See Comments)    "counter reacts"  . Shellfish Allergy Shortness Of Breath, Nausea And Vomiting and Other (See Comments)    Stomach cramps  . Tetracyclines & Related Anaphylaxis, Swelling and Other (See Comments)    Throat swelling requiring hospitalization  . Dilaudid [Hydromorphone Hcl] Other (See Comments)    "Lethargy"  . Incruse Ellipta [Umeclidinium Bromide] Nausea Only  . Other   . Tuberculin Tests Other  (See Comments)    False postive    Outpatient Encounter Medications as of 06/21/2020  Medication Sig  . acetaminophen (TYLENOL) 500 MG tablet Take 2 tablets (1,000 mg total) by mouth every 8 (eight) hours as needed for mild pain.  Marland Kitchen acyclovir (ZOVIRAX) 400 MG tablet TAKE 1 TABLET BY MOUTH  TWICE DAILY  . albuterol (PROVENTIL) (2.5 MG/3ML) 0.083% nebulizer solution Take 3 mLs (2.5 mg total) by nebulization 2 (two) times daily. And every 6 hours as needed.  DX: J44.9  . albuterol (VENTOLIN HFA) 108 (90 Base) MCG/ACT inhaler Inhale 2 puffs into the lungs every 6 (six) hours as needed for wheezing or shortness of breath.  Marland Kitchen atenolol (TENORMIN) 25 MG tablet TAKE 1 TABLET BY MOUTH  DAILY  . atorvastatin (LIPITOR) 20 MG tablet TAKE 1 TABLET BY MOUTH  DAILY  . budesonide-formoterol (SYMBICORT) 160-4.5 MCG/ACT inhaler Inhale 2 puffs into the lungs 2 (two) times daily.  Marland Kitchen buPROPion (WELLBUTRIN XL) 150 MG 24 hr tablet Take 1 tablet (150 mg total) by mouth daily.  . Camphor-Menthol-Methyl Sal 3.03-02-08 % PTCH   . cyclobenzaprine (FLEXERIL) 10 MG tablet TAKE 1 TABLET BY MOUTH AT  BEDTIME  . diazepam (VALIUM) 5 MG tablet Take oral route 45 minutes prior to procedure. Repeat PRN 20 minutes.  . diazepam (VALIUM) 5 MG tablet Take by mouth.  . hydrochlorothiazide (HYDRODIURIL) 25 MG tablet TAKE 1 TABLET BY MOUTH  DAILY  . Ipratropium-Albuterol (COMBIVENT RESPIMAT) 20-100 MCG/ACT AERS respimat Inhale 1 puff into the lungs every 6 (six) hours as needed for wheezing. (Patient taking differently: Inhale 1 puff into the lungs in the morning and at bedtime. Take additional puff if needed at  bedtime)  . levothyroxine (SYNTHROID) 25 MCG tablet TAKE 1 TABLET BY MOUTH  DAILY BEFORE BREAKFAST  . Liniments (SALONPAS ARTHRITIS PAIN RELIEF EX) Apply 1 patch topically daily as needed (to painful sites- remove as directed).   . Melatonin 10 MG TABS Take 10 mg by mouth at bedtime.  . montelukast (SINGULAIR) 10 MG tablet TAKE 1  TABLET BY MOUTH AT  BEDTIME  . naproxen (NAPROSYN) 500 MG tablet TAKE 1 TABLET BY MOUTH  TWICE DAILY WITH A MEAL  . nystatin-triamcinolone ointment (MYCOLOG) Apply 1 application topically 2 (two) times daily as needed (applied to skin folds/groins/under breast skin irritation rash.). (Patient taking differently: Apply 1 application topically 2 (two) times daily as needed (to skin folds/groin/under breast skin- for irritation/rashes).)  . OXYGEN Inhale 2-3 L/min into the lungs See admin instructions. Inhale  2-3 L/min of oxygen into the lungs w/CPAP at bedtime and as needed for shortness of breath with "strenuous activity" during the day  . pantoprazole (PROTONIX) 40 MG tablet TAKE 1 TABLET BY MOUTH  DAILY  . PATADAY 0.2 % SOLN Apply 1 drop to eye daily.  . polyethylene glycol (MIRALAX / GLYCOLAX) 17 g packet Take 17 g by mouth daily as needed for mild constipation.  . pregabalin (LYRICA) 150 MG capsule Take 1 capsule (150 mg total) by mouth at bedtime.  Marland Kitchen QUEtiapine (SEROQUEL) 50 MG tablet Take 1 tablet (50 mg total) by mouth at bedtime.  . roflumilast (DALIRESP) 500 MCG TABS tablet Take 1 tablet (500 mcg total) by mouth daily.  . traZODone (DESYREL) 100 MG tablet TAKE 1 TABLET BY MOUTH AT  BEDTIME  . venlafaxine XR (EFFEXOR-XR) 150 MG 24 hr capsule TAKE 1 CAPSULE BY MOUTH  DAILY WITH BREAKFAST   No facility-administered encounter medications on file as of 06/21/2020.    Patient Active Problem List   Diagnosis Date Noted  . PTSD (post-traumatic stress disorder) 02/09/2020  . Mood disorder in conditions classified elsewhere 02/09/2020  . Generalized anxiety disorder 02/09/2020  . Type II diabetes mellitus with manifestations ([place]) 10/29/2019  . Coarse tremors 10/28/2019  . Chronic rhinitis 10/21/2019  . Allergic rhinitis 06/16/2019  . Cervical spondylosis without myelopathy 12/18/2018  . Hypertension   . Encounter for general adult medical examination with abnormal findings 11/06/2018  .  Tobacco abuse 01/15/2018  . Hypomagnesemia 12/29/2017  . Pulmonary nodule 11/11/2017  . Carotid artery disease ([place]) 11/05/2017  . OSA on CPAP 08/07/2017  . Gastroesophageal reflux disease 06/23/2017  . Hypothyroidism due to acquired atrophy of thyroid 04/05/2017  . Primary osteoarthritis involving multiple joints 04/05/2017  . Chronic constipation 02/10/2017  . Iron deficiency anemia due to dietary causes 02/10/2017  . Osteoarthritis 02/10/2017  . Hyperlipidemia associated with type 2 diabetes mellitus ([place]) 02/10/2017  . Chronic respiratory failure with hypoxia ([place]) 12/16/2016  . Essential hypertension 11/29/2016  . Vocal cord dysfunction   . COPD, severe ([place]) 09/24/2016  . Anxiety 09/24/2016    Conditions to be addressed/monitored: Anxiety, Depression and housing; Limited social support, Housing barriers, Mental Health Concerns  and Family and relationship dysfunction  Care Plan : LCSW Plan Of Care  Updates made by Deirdre Peer, LCSW since 06/22/2020 12:00 AM    Problem: Need for patient-centered plan of care to promote improved mental health and overal quality of life.   Priority: High    Long-Range Goal: Develop self management plan to improve pt's mental health and overall health/quality of life.   Start Date: 05/09/2020  Expected End Date:  07/25/2020  This Visit's Progress: On track  Recent Progress: On track  Priority: High  Note:   Current barriers:   Patient continues to reside with her brother while seeking alternative housing. CSW plans to re-consult Care Guide to assist pt with linking with additional community resources for housing opportunities.  Pt denies any recent issues with brother and has been working to remove herself from his presence when their are moments of hostility. CSW validated pt and her response to getting away from the brother and his environment when necessary. She is currently residing with her brother and is seeking new housing options due to  some continued relationship issues. Per pt, her brother has been verbally agressive to her over the past 2 years; but not recent. Pt is able and aware of what to do if she feels threatened at any time. She is wanting to seek her own housing(rental) and a Care Guide consult has been placed for this assistance. CSW has also encouraged pt to seek housing options on her own as she can and reminded her that due to the availability being low at this time,it may be more difficult to find what she is looking for- location, price range, etc.   Pt provided with # for the [place] and plans to outreach them today as well.  . Patient in need of assistance with connecting to community resources for Limited social support, Housing barriers, Mental Health Concerns, Family and relationship dysfunction, and lacks knowledge of community resource: housing. . Acknowledges deficits with meeting this unmet need . Patient is unable to independently navigate community resource options without care coordination support . Patient with limited ability to complete applications for housing due to reported reading limitations Clinical Goals:  patient will work with SW to address concerns related to mental health support/needs patient will work with Care Guide  to address needs related to housing and other community needs as identified patient will follow up with Psychiatrist and Counselor to further discuss concerns and needs as directed by SW Clinical Interventions:  . Collaboration with [name], MD regarding development and update of comprehensive plan of care as evidenced by provider attestation and co-signature . Pt reports continued mental health support/sessions with her outpatient therapist and denies any current significant depression/anxiety. Pt denies SI/HI . Inter-disciplinary care team collaboration (see longitudinal plan of care) . Assessment of needs, barriers , agencies contacted, as well as how  impacting  . Review various resources, discussed options and provided patient information about Financial constraints related to housing, etc, Limited social support, Housing barriers, Mental Health Concerns , and Family and relationship dysfunction . Collaborated with appropriate clinical care team members regarding patient needs Financial constraints related to housing, Limited social support, Housing barriers, Mental Health Concerns , and Family and relationship dysfunction, COPD, Anxiety, and Depression . Patient interviewed and appropriate assessments performed . Referred patient to community resources care guide team for assistance with housing and other community support/resources as identified . Provided mental health counseling with regard to pt concerns/needs (mental health diagnosis or concern) . Discussed plans with patient for ongoing care management follow up and provided patient with direct contact information for care management team . Other interventions provided: o Motivational Interviewing o Solution-Focused Strategies o Brief CBT o Emotional/Supportive Counseling o Psychoeducation and/or Health Education o Problem Solving Patient Goals:   ask sister and others to assist with paperwork for housing applications  - begin a notebook of services in my neighborhood or community and  resources provided by Care Guide, RNCM and CSW - call 911 when I need emergency help - follow-up on any referrals for help I am given and seek housing options on my own in the local paper and community - think ahead to make sure my need does not become an emergency - make a note about what I need to have by the phone or take with me, like an identification card or social security number have a back-up plan - have a back-up plan - make a list of family or friends that I can call  -follow up with counselor and Psychiatrist at scheduled appointment times to further discuss mental health  concerns/needs -make Neurologist aware of concerns related to memory issues, recent stuttering,etc  Follow Up Plan: Appointment scheduled for SW follow up with client by phone on:  07/04/2020      Follow Up Plan: Appointment scheduled for SW follow up with client by phone on: 07/04/2020     Eduard Clos MSW, [place] Licensed Clinical Social Worker [place] [phone]

## 2020-06-27 ENCOUNTER — Telehealth: Payer: Self-pay | Admitting: *Deleted

## 2020-06-27 NOTE — Telephone Encounter (Signed)
   Telephone encounter was:  Successful.  06/27/2020 Name: Joan [name] MRN: [id number] DOB: 04-02-1960  Joan [name] is a 60 y.o. year old female who is a primary care patient of [name], MD . The community resource team was consulted for assistance with Housing for herself   Care guide performed the following interventions: Patient provided with information about care guide support team and interviewed to confirm resource needs.  Follow Up Plan:  No further follow up planned at this time. The patient has been provided with needed resources. Patient has tried to locate housing in the area from resources  going to search a few more leads for her but mostly [name] gave her everything that is available at the moment patient also has bad credit so in her price range housing will be hard to [place], Care Management  [phone] [address] [place] , [place] [address] Email : [email or web address]

## 2020-06-30 ENCOUNTER — Other Ambulatory Visit: Payer: Self-pay

## 2020-06-30 ENCOUNTER — Encounter: Payer: Self-pay | Admitting: Pulmonary Disease

## 2020-06-30 ENCOUNTER — Ambulatory Visit (INDEPENDENT_AMBULATORY_CARE_PROVIDER_SITE_OTHER): Payer: Medicare Other | Admitting: Pulmonary Disease

## 2020-06-30 VITALS — BP 126/72 | HR 67 | Temp 97.3°F | Ht 68.0 in | Wt 218.8 lb

## 2020-06-30 DIAGNOSIS — I10 Essential (primary) hypertension: Secondary | ICD-10-CM | POA: Diagnosis not present

## 2020-06-30 DIAGNOSIS — G4733 Obstructive sleep apnea (adult) (pediatric): Secondary | ICD-10-CM

## 2020-06-30 DIAGNOSIS — M150 Primary generalized (osteo)arthritis: Secondary | ICD-10-CM | POA: Diagnosis not present

## 2020-06-30 DIAGNOSIS — J449 Chronic obstructive pulmonary disease, unspecified: Secondary | ICD-10-CM | POA: Diagnosis not present

## 2020-06-30 DIAGNOSIS — J9611 Chronic respiratory failure with hypoxia: Secondary | ICD-10-CM | POA: Diagnosis not present

## 2020-06-30 DIAGNOSIS — Z9989 Dependence on other enabling machines and devices: Secondary | ICD-10-CM

## 2020-06-30 DIAGNOSIS — M47812 Spondylosis without myelopathy or radiculopathy, cervical region: Secondary | ICD-10-CM | POA: Diagnosis not present

## 2020-06-30 DIAGNOSIS — Z720 Tobacco use: Secondary | ICD-10-CM | POA: Diagnosis not present

## 2020-06-30 MED ORDER — BREZTRI AEROSPHERE 160-9-4.8 MCG/ACT IN AERO: 2.0000 | INHALATION_SPRAY | Freq: Two times a day (BID) | RESPIRATORY_TRACT | 0 refills | Status: DC

## 2020-06-30 NOTE — Progress Notes (Signed)
   Subjective:    Patient ID: Joan [name], female    DOB: [birth year], 60 y.o.   MRN: [id number]  HPI  59yoex-smokerforfollow-up ofsevereCOPD and OSA  Meds -allergy listed to prednisone-this caused pedal edema, prefers Medrol when needed  62-month follow-up visit Joan [name] continues to smoke a pack per day, she has been unable to quit. She arrives in a wheelchair, on oxygen .  She would like to be evaluated for POC She reports that her feet swell during the day. She reports cough and pain in the right chest  She remains on Symbicort which he obtains from the company. She uses Combivent on an as-needed basis. She reports that she is using her CPAP and denies problems with mask or pressure     Significant tests/ events reviewed  NPSG 02/2017 >> no OSA HST 04/2017 15/h   02/2019 pFTS >> RATIO 45, fev 1 37% - UNCHANGED , + bd response, DLCO 44%  10/2016 PFTs showed ratio 49, FEV1 of 36%, FVC 58% and DLCO of 34% consistent with severe obstruction.  02/2020 LDCT >>  stable nodules  11/2016 CT chestbullous changes BL, 83mmRUl & 2 mm LULnodules >> stable    ONO10/2018 on CPAP/room air showed desaturation for 1 hour 45 minutes started on nocturnal oxygen>>   Review of Systems neg for any significant sore throat, dysphagia, itching, sneezing, nasal congestion or excess/ purulent secretions, fever, chills, sweats, unintended wt loss, pleuritic or exertional cp, hempoptysis, orthopnea pnd or change in chronic leg swelling. Also denies presyncope, palpitations, heartburn, abdominal pain, nausea, vomiting, diarrhea or change in bowel or urinary habits, dysuria,hematuria, rash, arthralgias, visual complaints, headache, numbness weakness or ataxia.     Objective:   Physical Exam  Gen. Pleasant, obese, in no distress ENT - no lesions, no post nasal drip Neck: No JVD, no thyromegaly, no carotid bruits Lungs: no use of accessory muscles, no dullness to percussion,  decreased without rales or rhonchi  Cardiovascular: Rhythm regular, heart sounds  normal, no murmurs or gallops, no peripheral edema Musculoskeletal: No deformities, no cyanosis or clubbing , no tremors       Assessment & Plan:

## 2020-06-30 NOTE — Assessment & Plan Note (Signed)
I gave her a sample of Breztri today.  She will try this for 2 weeks and report back if this works. We may have to support her to obtain assistance from the company if this works for her

## 2020-06-30 NOTE — Assessment & Plan Note (Signed)
Smoking cessation was again emphasized is the most important intervention for her

## 2020-06-30 NOTE — Patient Instructions (Signed)
Evaluate for POC STOP smoking !! Trial of Breztri 2 puffs twice daily - call me back for Rx if this works We will try to get you into a rehab program

## 2020-06-30 NOTE — Assessment & Plan Note (Signed)
CPAP compliance was reviewed on 10 cm which shows sporadic compliance with average use more than 5 hours per night, good control of events and large leak  Weight loss encouraged, compliance with goal of at least 4-6 hrs every night is the expectation. Advised against medications with sedative side effects Cautioned against driving when sleepy - understanding that sleepiness will vary on a day to day basis

## 2020-06-30 NOTE — Assessment & Plan Note (Signed)
We will evaluate her with ambulation for POC today she is already requiring 3 L continuous so not sure that she will qualify

## 2020-07-03 ENCOUNTER — Ambulatory Visit (INDEPENDENT_AMBULATORY_CARE_PROVIDER_SITE_OTHER): Payer: Medicare Other | Admitting: *Deleted

## 2020-07-03 ENCOUNTER — Ambulatory Visit (INDEPENDENT_AMBULATORY_CARE_PROVIDER_SITE_OTHER): Payer: Medicare Other | Admitting: Licensed Clinical Social Worker

## 2020-07-03 DIAGNOSIS — J449 Chronic obstructive pulmonary disease, unspecified: Secondary | ICD-10-CM

## 2020-07-03 DIAGNOSIS — F4310 Post-traumatic stress disorder, unspecified: Secondary | ICD-10-CM

## 2020-07-03 DIAGNOSIS — I10 Essential (primary) hypertension: Secondary | ICD-10-CM

## 2020-07-03 DIAGNOSIS — F0630 Mood disorder due to known physiological condition, unspecified: Secondary | ICD-10-CM | POA: Diagnosis not present

## 2020-07-03 DIAGNOSIS — F411 Generalized anxiety disorder: Secondary | ICD-10-CM | POA: Diagnosis not present

## 2020-07-03 DIAGNOSIS — R443 Hallucinations, unspecified: Secondary | ICD-10-CM | POA: Diagnosis not present

## 2020-07-03 DIAGNOSIS — F32A Depression, unspecified: Secondary | ICD-10-CM | POA: Diagnosis not present

## 2020-07-03 DIAGNOSIS — E118 Type 2 diabetes mellitus with unspecified complications: Secondary | ICD-10-CM | POA: Diagnosis not present

## 2020-07-03 NOTE — Progress Notes (Signed)
Virtual Visit via Telephone Note  I connected with Joan [name] on 07/03/20 at  2:00 PM EDT by telephone and verified that I am speaking with the correct person using two identifiers.  Location: Patient: home Provider: home office   I discussed the limitations, risks, security and privacy concerns of performing an evaluation and management service by telephone and the availability of in person appointments. I also discussed with the patient that there may be a patient responsible charge related to this service. The patient expressed understanding and agreed to proceed.   I discussed the assessment and treatment plan with the patient. The patient was provided an opportunity to ask questions and all were answered. The patient agreed with the plan and demonstrated an understanding of the instructions.   The patient was advised to call back or seek an in-person evaluation if the symptoms worsen or if the condition fails to improve as anticipated.  I provided 53 minutes of non-face-to-face time during this encounter.  THERAPIST PROGRESS NOTE  Session Time: 2:00 PM to 2:53 PM  Participation Level: Active  Behavioral Response: CasualAlertIrritable and appropriate  Type of Therapy: Individual Therapy  Treatment Goals addressed:  PTSD, coping, wants to work on where she is and work through her childhood Interventions: Solution Focused, Strength-based, Supportive and Other: coping  Summary: Joan [name] is a 60 y.o. female who presents with "hanging in there" looking for apartment nothing yet. Everything really expensive. Caseworkers helping sent her places and helping her. Housing places with huge lists and put her name on it anyway. Highest can go $575.  Wishes they would not waste her time on the announcement when she finds out on looking closer that has to share. Doesn't want to share. Explored what the prospects are and there are huge lists. Family feedback is that they think  she needs mental hospital intense counseling. Discussed how mental health is set up at intensive level and doesn't want to be in groups.  Family talking about hospitalization and patient not suicidal and further patient is not interested in that. Has been in those type of hospitals because of her use in the past and knows to get in have to be suicidal or extremely messed up like on drugs. Explored why family things she needs a higher level deep issues that haven't worked on. Patient does agree hasn't worked on these issues. We discussed programs she has been to that have not been helpful. Trauma-shares past was horrible nothing seems to work. Brother wants her out and still go to program and pay the rent. Not going to go somewhere and not come out and not have a place to live. Still have to move out and just put her a few months out while could be looking for a place. May look at apartment still open. Sister says what if emergency. Explored this and not that far that they couldn't come. With lungs tested for device where she can carry on your side and last longer. Gives her more mobility. Neurosurgeon two shots of Cortizone shots too much pain. Head pain from fall impacts neck, arms and gets dizzy. She feels there is too much arthritis and have to do something "to free up nerves". Removed all the infection on the appendix.  Therapist guided patient as a treatment strategy to focus on something positive and patient shared "the time with [name] and myself. [name] keeps me straight." Discussed keeping some space between her and brother as a good idea.  Reviewed  what patient wants for treatment stairs does not have to do with family but what she wants to work on, had wanted to work on trauma but switched to working on the needed to work on which was dealing with stresses in her life. That things have been stressful and how to deal with the situation. "Somebody to keep me sane." Need to worry about what is going on right now.  Not the past. Talk about it and getting through it. Health issues and what we have been talking about.          Therapist reviewed symptoms, facilitated expression of thoughts and feelings as important intervention for patient as she is managing significant stressors that are taking a while to resolve such as housing along with medical issues.  Explored best options for treatment if patient needs a higher level and when therapist explained higher level would mean groups does not feel that would be helpful and rather stay with this therapist.  Focused on anything positive and that includes her spirituality, as well as getting a device for oxygen that we will give her more mobility will help with functioning.  Continue to problem solve around stressors and encourage patient to look at apartments that are further out if those are her best option, provided positive feedback for patient's persistence and continuing to look for housing even though there are a lot of obstacles to that.  Urged her to use case manager to help her with housing.  Therapist provided active listening open questions, supportive interventions Suicidal/Homicidal: No  Plan: Return again in 2 weeks.2. Provide supportive interventions and help patient process feelings related to relationships, stressors, coping  Diagnosis: Axis I: PTSD, hallucinations, GAD, Mood Disorder in conditions classified elsewhere    Axis II: No diagnosis    [name], LCSW 07/03/2020
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
## 2020-07-03 NOTE — Patient Instructions (Signed)
Visit Information  Joan [name], it was nice talking with you today.   Please read over the attached information, and start now to make changes to your diet to decrease your salt intake.  Please try to begin checking your blood pressures each week and write these values down on paper: we will review these during our future phone calls.  Please also try to get regular activity on the days the weather is nice.  Although you have said you are no longer trying to quit smoking- please read over the attached information about the benefits of stopping smoking; this is especially important when you have breathing problems and are on oxygen.   I look forward to talking to you again for an update on Tuesday, September 05, 2020 at 1:00 pm- please be listening out for my call that day.  I will call as close to 1:00 pm as possible; I look forward to hearing about your progress.   Please don't hesitate to contact me if I can be of assistance to you before our next scheduled appointment.   Oneta Rack, RN, BSN, [name] RN Care Coordination- [place] [phone]: direct office [phone]: mobile      PATIENT GOALS: Goals Addressed            This Visit's Progress   . Lifestyle Change-Hypertension   On track    Timeframe:  Long-Range Goal Priority:  Medium Start Date:      05/05/20                       Expected End Date:       11/05/20                Follow Up Date 09/05/20   . Continue trying to make lifestyle changes to improve blood pressure; ask questions to understand why this important if you don't understand . Learn about high blood pressure, and please try to monitor/ record your blood pressure about once a week and write it down on paper.  Knowing what your blood pressure typically runs is a good way to know how well your medications are working; this will also help your doctor know if your medications are working or need to be adjusted . Continue making small  changes to your diet to incorporate lowering your salt intake, reducing your sugar intake, and eating more fresh foods instead of prepared meals which are often high in sodium . Continue to take steps to follow through on the resources that were provided to you around housing options . Please continue considering stopping smoking- this will help both your breathing and your blood pressure get better   Why is this important?    The changes that you are asked to make may be hard to do.   This is especially true when the changes are life-long.   Knowing why it is important to you is the first step.   Working on the change with your family or support person helps you not feel alone.   Reward yourself and family or support person when goals are met. This can be an activity you choose like bowling, hiking, biking, swimming or shooting hoops.       . Track and Manage My Symptoms-COPD   On track    Timeframe:  Long-Range Goal Priority:  High Start Date:      05/05/20  Expected End Date:     11/05/20                  Follow Up Date 09/05/20   . Now that your home health services are completed, please try to continue getting some light activity on days the weather is nice  . Follow rescue plan if symptoms flare-up- if you have trouble with your breathing, call your lung doctor right away . I understand that you are no longer interested in trying to quit smoking: please continue to consider reducing/ stopping smoking; as we have discussed- this is the most important thing you can do to improve your health . Please try to start a journal of how many cigarettes you are smoking each day so we can discuss this when we talk again . I am glad that you talked with the care guide and the social worker about housing resources, financial and food resources available to you; and to make sure you are working with your psychiatric providers, properly managing your stress, depression, and  anxiety should also help you quit smoking: please continue trying to manage your stress, and take action to act on the resources provided to you  . Contact your pulmonary provider team (Dr. Elsworth Soho) if you have questions about obtaining light-weight portable oxygen tanks and your use of the sample inhalers that were provided to you during the recent office visit   Why is this important?    Tracking your symptoms and other information about your health helps your doctor plan your care.   Write down the symptoms, the time of day, what you were doing and what medicine you are taking.   You will soon learn how to manage your symptoms.           [email or web address]">  DASH Eating Plan DASH stands for Dietary Approaches to Stop Hypertension. The DASH eating plan is a healthy eating plan that has been shown to:  Reduce high blood pressure (hypertension).  Reduce your risk for type 2 diabetes, heart disease, and stroke.  Help with weight loss. What are tips for following this plan? Reading food labels  Check food labels for the amount of salt (sodium) per serving. Choose foods with less than 5 percent of the Daily Value of sodium. Generally, foods with less than 300 milligrams (mg) of sodium per serving fit into this eating plan.  To find whole grains, look for the word "whole" as the first word in the ingredient list. Shopping  Buy products labeled as "low-sodium" or "no salt added."  Buy fresh foods. Avoid canned foods and pre-made or frozen meals. Cooking  Avoid adding salt when cooking. Use salt-free seasonings or herbs instead of table salt or sea salt. Check with your health care provider or pharmacist before using salt substitutes.  Do not fry foods. Cook foods using healthy methods such as baking, boiling, grilling, roasting, and broiling instead.  Cook with heart-healthy oils, such as olive, canola, avocado, soybean, or sunflower  oil. Meal planning  Eat a balanced diet that includes: ? 4 or more servings of fruits and 4 or more servings of vegetables each day. Try to fill one-half of your plate with fruits and vegetables. ? 6-8 servings of whole grains each day. ? Less than 6 oz (170 g) of lean meat, poultry, or fish each day. A 3-oz (85-g) serving of meat is about the same size as a deck of cards. One egg equals 1 oz (28 g). ? 2-3 servings  of low-fat dairy each day. One serving is 1 cup (237 mL). ? 1 serving of nuts, seeds, or beans 5 times each week. ? 2-3 servings of heart-healthy fats. Healthy fats called omega-3 fatty acids are found in foods such as walnuts, flaxseeds, fortified milks, and eggs. These fats are also found in cold-water fish, such as sardines, salmon, and mackerel.  Limit how much you eat of: ? Canned or prepackaged foods. ? Food that is high in trans fat, such as some fried foods. ? Food that is high in saturated fat, such as fatty meat. ? Desserts and other sweets, sugary drinks, and other foods with added sugar. ? Full-fat dairy products.  Do not salt foods before eating.  Do not eat more than 4 egg yolks a week.  Try to eat at least 2 vegetarian meals a week.  Eat more home-cooked food and less restaurant, buffet, and fast food.   Lifestyle  When eating at a restaurant, ask that your food be prepared with less salt or no salt, if possible.  If you drink alcohol: ? Limit how much you use to:  0-1 drink a day for women who are not pregnant.  0-2 drinks a day for men. ? Be aware of how much alcohol is in your drink. In the [place] one drink equals one 12 oz bottle of beer (355 mL), one 5 oz glass of wine (148 mL), or one 1 oz glass of hard liquor (44 mL). General information  Avoid eating more than 2,300 mg of salt a day. If you have hypertension, you may need to reduce your sodium intake to 1,500 mg a day.  Work with your health care provider to maintain a healthy body weight or  to lose weight. Ask what an ideal weight is for you.  Get at least 30 minutes of exercise that causes your heart to beat faster (aerobic exercise) most days of the week. Activities may include walking, swimming, or biking.  Work with your health care provider or dietitian to adjust your eating plan to your individual calorie needs. What foods should I eat? Fruits All fresh, dried, or frozen fruit. Canned fruit in natural juice (without added sugar). Vegetables Fresh or frozen vegetables (raw, steamed, roasted, or grilled). Low-sodium or reduced-sodium tomato and vegetable juice. Low-sodium or reduced-sodium tomato sauce and tomato paste. Low-sodium or reduced-sodium canned vegetables. Grains Whole-grain or whole-wheat bread. Whole-grain or whole-wheat pasta. Brown rice. [place]. Bulgur. Whole-grain and low-sodium cereals. Pita bread. Low-fat, low-sodium crackers. Whole-wheat flour tortillas. Meats and other proteins Skinless chicken or [place]. Ground chicken or [place]. Pork with fat trimmed off. Fish and seafood. Egg whites. Dried beans, peas, or lentils. Unsalted nuts, nut butters, and seeds. Unsalted canned beans. Lean cuts of beef with fat trimmed off. Low-sodium, lean precooked or cured meat, such as sausages or meat loaves. Dairy Low-fat (1%) or fat-free (skim) milk. Reduced-fat, low-fat, or fat-free cheeses. Nonfat, low-sodium ricotta or cottage cheese. Low-fat or nonfat yogurt. Low-fat, low-sodium cheese. Fats and oils Soft margarine without trans fats. Vegetable oil. Reduced-fat, low-fat, or light mayonnaise and salad dressings (reduced-sodium). Canola, safflower, olive, avocado, soybean, and sunflower oils. Avocado. Seasonings and condiments Herbs. Spices. Seasoning mixes without salt. Other foods Unsalted popcorn and pretzels. Fat-free sweets. The items listed above may not be a complete list of foods and beverages you can eat. Contact a dietitian for more information. What  foods should I avoid? Fruits Canned fruit in a light or heavy syrup. Fried fruit. Fruit  in cream or butter sauce. Vegetables Creamed or fried vegetables. Vegetables in a cheese sauce. Regular canned vegetables (not low-sodium or reduced-sodium). Regular canned tomato sauce and paste (not low-sodium or reduced-sodium). Regular tomato and vegetable juice (not low-sodium or reduced-sodium). Angie Fava. Olives. Grains Baked goods made with fat, such as croissants, muffins, or some breads. Dry pasta or rice meal packs. Meats and other proteins Fatty cuts of meat. Ribs. Fried meat. Berniece Salines. Bologna, salami, and other precooked or cured meats, such as sausages or meat loaves. Fat from the back of a pig (fatback). Bratwurst. Salted nuts and seeds. Canned beans with added salt. Canned or smoked fish. Whole eggs or egg yolks. Chicken or [place] with skin. Dairy Whole or 2% milk, cream, and half-and-half. Whole or full-fat cream cheese. Whole-fat or sweetened yogurt. Full-fat cheese. Nondairy creamers. Whipped toppings. Processed cheese and cheese spreads. Fats and oils Butter. Stick margarine. Lard. Shortening. Ghee. Bacon fat. Tropical oils, such as coconut, palm kernel, or palm oil. Seasonings and condiments Onion salt, garlic salt, seasoned salt, table salt, and sea salt. Worcestershire sauce. Tartar sauce. Barbecue sauce. Teriyaki sauce. Soy sauce, including reduced-sodium. Steak sauce. Canned and packaged gravies. Fish sauce. Oyster sauce. Cocktail sauce. Store-bought horseradish. Ketchup. Mustard. Meat flavorings and tenderizers. Bouillon cubes. Hot sauces. Pre-made or packaged marinades. Pre-made or packaged taco seasonings. Relishes. Regular salad dressings. Other foods Salted popcorn and pretzels. The items listed above may not be a complete list of foods and beverages you should avoid. Contact a dietitian for more information. Where to find more information  National Heart, Lung, and Blood Institute:  https://wilson-eaton.com/  American Heart Association: www.heart.org  Academy of Nutrition and Dietetics: www.eatright.[place]: www.kidney.org Summary  The DASH eating plan is a healthy eating plan that has been shown to reduce high blood pressure (hypertension). It may also reduce your risk for type 2 diabetes, heart disease, and stroke.  When on the DASH eating plan, aim to eat more fresh fruits and vegetables, whole grains, lean proteins, low-fat dairy, and heart-healthy fats.  With the DASH eating plan, you should limit salt (sodium) intake to 2,300 mg a day. If you have hypertension, you may need to reduce your sodium intake to 1,500 mg a day.  Work with your health care provider or dietitian to adjust your eating plan to your individual calorie needs. This information is not intended to replace advice given to you by your health care provider. Make sure you discuss any questions you have with your health care provider. Document Revised: 01/15/2019 Document Reviewed: 01/15/2019 Elsevier Patient Education  2021 [place].   Managing Your Hypertension Hypertension, also called high blood pressure, is when the force of the blood pressing against the walls of the arteries is too strong. Arteries are blood vessels that carry blood from your heart throughout your body. Hypertension forces the heart to work harder to pump blood and may cause the arteries to become narrow or stiff. Understanding blood pressure readings Your personal target blood pressure may vary depending on your medical conditions, your age, and other factors. A blood pressure reading includes a higher number over a lower number. Ideally, your blood pressure should be below 120/80. You should know that:  The first, or top, number is called the systolic pressure. It is a measure of the pressure in your arteries as your heart beats.  The second, or bottom number, is called the diastolic pressure. It is a  measure of the pressure in your arteries as  the heart relaxes. Blood pressure is classified into four stages. Based on your blood pressure reading, your health care provider may use the following stages to determine what type of treatment you need, if any. Systolic pressure and diastolic pressure are measured in a unit called mmHg. Normal  Systolic pressure: below 127.  Diastolic pressure: below 80. Elevated  Systolic pressure: 517-001.  Diastolic pressure: below 80. Hypertension stage 1  Systolic pressure: 749-449.  Diastolic pressure: 67-59. Hypertension stage 2  Systolic pressure: 163 or above.  Diastolic pressure: 90 or above. How can this condition affect me? Managing your hypertension is an important responsibility. Over time, hypertension can damage the arteries and decrease blood flow to important parts of the body, including the brain, heart, and kidneys. Having untreated or uncontrolled hypertension can lead to:  A heart attack.  A stroke.  A weakened blood vessel (aneurysm).  Heart failure.  Kidney damage.  Eye damage.  Metabolic syndrome.  Memory and concentration problems.  Vascular dementia. What actions can I take to manage this condition? Hypertension can be managed by making lifestyle changes and possibly by taking medicines. Your health care provider will help you make a plan to bring your blood pressure within a normal range. Nutrition  Eat a diet that is high in fiber and potassium, and low in salt (sodium), added sugar, and fat. An example eating plan is called the Dietary Approaches to Stop Hypertension (DASH) diet. To eat this way: ? Eat plenty of fresh fruits and vegetables. Try to fill one-half of your plate at each meal with fruits and vegetables. ? Eat whole grains, such as whole-wheat pasta, brown rice, or whole-grain bread. Fill about one-fourth of your plate with whole grains. ? Eat low-fat dairy products. ? Avoid fatty cuts of meat,  processed or cured meats, and poultry with skin. Fill about one-fourth of your plate with lean proteins such as fish, chicken without skin, beans, eggs, and tofu. ? Avoid pre-made and processed foods. These tend to be higher in sodium, added sugar, and fat.  Reduce your daily sodium intake. Most people with hypertension should eat less than 1,500 mg of sodium a day.   Lifestyle  Work with your health care provider to maintain a healthy body weight or to lose weight. Ask what an ideal weight is for you.  Get at least 30 minutes of exercise that causes your heart to beat faster (aerobic exercise) most days of the week. Activities may include walking, swimming, or biking.  Include exercise to strengthen your muscles (resistance exercise), such as weight lifting, as part of your weekly exercise routine. Try to do these types of exercises for 30 minutes at least 3 days a week.  Do not use any products that contain nicotine or tobacco, such as cigarettes, e-cigarettes, and chewing tobacco. If you need help quitting, ask your health care provider.  Control any long-term (chronic) conditions you have, such as high cholesterol or diabetes.  Identify your sources of stress and find ways to manage stress. This may include meditation, deep breathing, or making time for fun activities.   Alcohol use  Do not drink alcohol if: ? Your health care provider tells you not to drink. ? You are pregnant, may be pregnant, or are planning to become pregnant.  If you drink alcohol: ? Limit how much you use to:  0-1 drink a day for women.  0-2 drinks a day for men. ? Be aware of how much alcohol is in your drink.  In the [place] one drink equals one 12 oz bottle of beer (355 mL), one 5 oz glass of wine (148 mL), or one 1 oz glass of hard liquor (44 mL). Medicines Your health care provider may prescribe medicine if lifestyle changes are not enough to get your blood pressure under control and if:  Your systolic  blood pressure is 130 or higher.  Your diastolic blood pressure is 80 or higher. Take medicines only as told by your health care provider. Follow the directions carefully. Blood pressure medicines must be taken as told by your health care provider. The medicine does not work as well when you skip doses. Skipping doses also puts you at risk for problems. Monitoring Before you monitor your blood pressure:  Do not smoke, drink caffeinated beverages, or exercise within 30 minutes before taking a measurement.  Use the bathroom and empty your bladder (urinate).  Sit quietly for at least 5 minutes before taking measurements. Monitor your blood pressure at home as told by your health care provider. To do this:  Sit with your back straight and supported.  Place your feet flat on the floor. Do not cross your legs.  Support your arm on a flat surface, such as a table. Make sure your upper arm is at heart level.  Each time you measure, take two or three readings one minute apart and record the results. You may also need to have your blood pressure checked regularly by your health care provider.   General information  Talk with your health care provider about your diet, exercise habits, and other lifestyle factors that may be contributing to hypertension.  Review all the medicines you take with your health care provider because there may be side effects or interactions.  Keep all visits as told by your health care provider. Your health care provider can help you create and adjust your plan for managing your high blood pressure. Where to find more information  National Heart, Lung, and Blood Institute: https://wilson-eaton.com/  American Heart Association: www.heart.org Contact a health care provider if:  You think you are having a reaction to medicines you have taken.  You have repeated (recurrent) headaches.  You feel dizzy.  You have swelling in your ankles.  You have trouble with your  vision. Get help right away if:  You develop a severe headache or confusion.  You have unusual weakness or numbness, or you feel faint.  You have severe pain in your chest or abdomen.  You vomit repeatedly.  You have trouble breathing. These symptoms may represent a serious problem that is an emergency. Do not wait to see if the symptoms will go away. Get medical help right away. Call your local emergency services (911 in the [place] Do not drive yourself to the hospital. Summary  Hypertension is when the force of blood pumping through your arteries is too strong. If this condition is not controlled, it may put you at risk for serious complications.  Your personal target blood pressure may vary depending on your medical conditions, your age, and other factors. For most people, a normal blood pressure is less than 120/80.  Hypertension is managed by lifestyle changes, medicines, or both.  Lifestyle changes to help manage hypertension include losing weight, eating a healthy, low-sodium diet, exercising more, stopping smoking, and limiting alcohol. This information is not intended to replace advice given to you by your health care provider. Make sure you discuss any questions you have with your health care provider. Document  Revised: 03/19/2019 Document Reviewed: 01/12/2019 Elsevier Patient Education  2021 [place] [employer] [place], [phone]), [id number]. http://knight.[email or web address]">  Health Risks of Smoking Smoking tobacco is very bad for your health. Tobacco smoke contains many toxic chemicals that can damage every part of your body. Secondhand smoke can be harmful to those around you. Tobacco or nicotine use can cause many long-term (chronic) diseases. Smoking is difficult to quit because a chemical in tobacco, called nicotine, causes addiction or dependence. When you smoke and inhale, nicotine is absorbed quickly into the  bloodstream through your lungs. Both inhaled and non-inhaled nicotine may be addictive. How can quitting affect me? There are health benefits of quitting smoking. Some benefits happen right away and others take time. Benefits may include:  Blood flow, blood pressure, heart rate, and lung capacity may begin to improve. However, any lung damage that has already occurred cannot be repaired.  Temporary respiratory symptoms, such as nasal congestion and cough, may improve over time.  Your risk of heart disease, stroke, and cancer is reduced.  The overall quality of your health may improve.  You may save money, as you will not spend money on tobacco products and may spend less money on smoking-related health issues. What can increase my risk? Smoking harms nearly every organ in the body. People who smoke tobacco have a shorter life expectancy and an increased risk of many serious medical problems. These include:  More respiratory infections, such as colds and pneumonia.  Cancer.  Heart disease.  Stroke.  Chronic respiratory diseases.  Delayed wound healing and increased risk of complications during surgery.  Problems with reproduction, pregnancy, and childbirth, such as infertility, early (premature) births, stillbirths, and birth defects. Secondhand smoke exposure to children increases the risk of:  Sudden infant death syndrome (SIDS).  Infections in the nose, throat, or airways (respiratory infections).  Chronic respiratory symptoms.   What actions can I take to quit? Smoking is an addiction that affects both your body and your mind, and long-time habits can be hard to change. Your health care provider can recommend:  Nicotine replacement products, such as patches, gum, and nasal sprays. Use these products only as directed. Do not replace cigarette smoking with electronic cigarettes, which are commonly called e-cigarettes. The safety of e-cigarettes is not known, and some may  contain harmful chemicals.  Programs and community resources, which may include group support, education, or talk therapy.  Prescription medicines to help reduce cravings.  A combination of two or more quit methods, which will increase the success of quitting.   Where to find support Follow the recommendations from your health care provider about support groups and other assistance. You can also visit:  [employer]: www.naquitline.org or call 1-800-QUIT-NOW.  U.S. Department of Health and Human Services: www.smokefree.gov  American Lung Association: www.freedomfromsmoking.org  American Heart Association: www.heart.org Where to find more information  Centers for Disease Control and Prevention: http://www.wolf.info/  World Health Organization: RoleLink.com.br Summary  Smoking tobacco is very bad for your health. Tobacco smoke contains many toxic chemicals that can damage every part of the body.  Smoking is difficult to quit because a chemical in tobacco, called nicotine, causes addiction or dependence.  There are immediate and long-term health benefits of quitting smoking.  A combination of two or more quit methods increases the success of quitting. This information is not intended to replace advice given to you by your health care provider. Make sure you discuss any  questions you have with your health care provider. Document Revised: 03/29/2019 Document Reviewed: 03/29/2019 Elsevier Patient Education  2021 [employer].    The patient verbalized understanding of instructions, educational materials, and care plan provided today and agreed to receive a mailed copy of patient instructions, educational materials, and care plan.   Telephone follow up appointment with care management team member scheduled for: September 05, 2020 at 1:00 pm  The patient has been provided with contact information for the care management team and has been advised to call with any health related  questions or concerns.   Oneta Rack, RN, BSN, [facility] RN Care Coordination- [place] [phone]: direct office [phone]: mobile

## 2020-07-04 ENCOUNTER — Ambulatory Visit: Payer: Medicare Other | Admitting: *Deleted

## 2020-07-04 DIAGNOSIS — E639 Nutritional deficiency, unspecified: Secondary | ICD-10-CM

## 2020-07-04 DIAGNOSIS — F0630 Mood disorder due to known physiological condition, unspecified: Secondary | ICD-10-CM

## 2020-07-04 DIAGNOSIS — J449 Chronic obstructive pulmonary disease, unspecified: Secondary | ICD-10-CM

## 2020-07-04 DIAGNOSIS — Z720 Tobacco use: Secondary | ICD-10-CM

## 2020-07-04 DIAGNOSIS — I10 Essential (primary) hypertension: Secondary | ICD-10-CM | POA: Diagnosis not present

## 2020-07-04 DIAGNOSIS — E118 Type 2 diabetes mellitus with unspecified complications: Secondary | ICD-10-CM

## 2020-07-04 DIAGNOSIS — F419 Anxiety disorder, unspecified: Secondary | ICD-10-CM

## 2020-07-04 DIAGNOSIS — F4310 Post-traumatic stress disorder, unspecified: Secondary | ICD-10-CM

## 2020-07-05 NOTE — Chronic Care Management (AMB) (Signed)
Chronic Care Management    Clinical Social Work Note  07/05/2020 Name: Joan [name] MRN: [id number] DOB: June 15, 1960  Joan [name] is a 60 y.o. year old female who is a primary care patient of Sharlet Salina Real Cons, MD. The CCM team was consulted to assist the patient with chronic disease management and/or care coordination needs related to: [employer]  and [place] and Resources.  Engaged with patient by telephone for follow up visit in response to provider referral for social work chronic care management and care coordination services.  Consent to Services:  The patient was given information about Chronic Care Management services, agreed to services, and gave verbal consent prior to initiation of services.  Please see initial visit note for detailed documentation.  Patient agreed to services and consent obtained.  Assessment: Review of patient past medical history, allergies, medications, and health status, including review of relevant consultants reports was performed today as part of a comprehensive evaluation and provision of chronic care management and care coordination services.    Advanced Directives Status: Not addressed in this encounter.  CCM Care Plan  Allergies  Allergen Reactions  . Keflex [Cephalexin] Rash and Other (See Comments)    Has tolerated amoxicillin since had keflex  . Prednisone Other (See Comments)    "counter reacts"  . Shellfish Allergy Shortness Of Breath, Nausea And Vomiting and Other (See Comments)    Stomach cramps  . Tetracyclines & Related Anaphylaxis, Swelling and Other (See Comments)    Throat swelling requiring hospitalization  . Dilaudid [Hydromorphone Hcl] Other (See Comments)    "Lethargy"  . Incruse Ellipta [Umeclidinium Bromide] Nausea Only  . Other   . Tuberculin Tests Other (See Comments)    False postive    Outpatient Encounter Medications as of 07/04/2020  Medication Sig  . acetaminophen (TYLENOL)  500 MG tablet Take 2 tablets (1,000 mg total) by mouth every 8 (eight) hours as needed for mild pain.  Marland Kitchen acyclovir (ZOVIRAX) 400 MG tablet TAKE 1 TABLET BY MOUTH  TWICE DAILY  . albuterol (PROVENTIL) (2.5 MG/3ML) 0.083% nebulizer solution Take 3 mLs (2.5 mg total) by nebulization 2 (two) times daily. And every 6 hours as needed.  DX: J44.9  . albuterol (VENTOLIN HFA) 108 (90 Base) MCG/ACT inhaler Inhale 2 puffs into the lungs every 6 (six) hours as needed for wheezing or shortness of breath.  Marland Kitchen atenolol (TENORMIN) 25 MG tablet TAKE 1 TABLET BY MOUTH  DAILY  . atorvastatin (LIPITOR) 20 MG tablet TAKE 1 TABLET BY MOUTH  DAILY  . Budeson-Glycopyrrol-Formoterol (BREZTRI AEROSPHERE) 160-9-4.8 MCG/ACT AERO Inhale 2 puffs into the lungs in the morning and at bedtime.  . budesonide-formoterol (SYMBICORT) 160-4.5 MCG/ACT inhaler Inhale 2 puffs into the lungs 2 (two) times daily.  Marland Kitchen buPROPion (WELLBUTRIN XL) 150 MG 24 hr tablet Take 1 tablet (150 mg total) by mouth daily.  . Camphor-Menthol-Methyl Sal 3.03-02-08 % PTCH   . cyclobenzaprine (FLEXERIL) 10 MG tablet TAKE 1 TABLET BY MOUTH AT  BEDTIME  . diazepam (VALIUM) 5 MG tablet Take oral route 45 minutes prior to procedure. Repeat PRN 20 minutes.  . diazepam (VALIUM) 5 MG tablet Take by mouth.  . hydrochlorothiazide (HYDRODIURIL) 25 MG tablet TAKE 1 TABLET BY MOUTH  DAILY  . Ipratropium-Albuterol (COMBIVENT RESPIMAT) 20-100 MCG/ACT AERS respimat Inhale 1 puff into the lungs every 6 (six) hours as needed for wheezing. (Patient taking differently: Inhale 1 puff into the lungs in the morning and at bedtime. Take additional puff  if needed at bedtime)  . levothyroxine (SYNTHROID) 25 MCG tablet TAKE 1 TABLET BY MOUTH  DAILY BEFORE BREAKFAST  . Liniments (SALONPAS ARTHRITIS PAIN RELIEF EX) Apply 1 patch topically daily as needed (to painful sites- remove as directed).   . Melatonin 10 MG TABS Take 10 mg by mouth at bedtime.  . montelukast (SINGULAIR) 10 MG tablet  TAKE 1 TABLET BY MOUTH AT  BEDTIME  . naproxen (NAPROSYN) 500 MG tablet TAKE 1 TABLET BY MOUTH  TWICE DAILY WITH A MEAL  . nystatin-triamcinolone ointment (MYCOLOG) Apply 1 application topically 2 (two) times daily as needed (applied to skin folds/groins/under breast skin irritation rash.). (Patient taking differently: Apply 1 application topically 2 (two) times daily as needed (to skin folds/groin/under breast skin- for irritation/rashes).)  . OXYGEN Inhale 2-3 L/min into the lungs See admin instructions. Inhale  2-3 L/min of oxygen into the lungs w/CPAP at bedtime and as needed for shortness of breath with "strenuous activity" during the day  . pantoprazole (PROTONIX) 40 MG tablet TAKE 1 TABLET BY MOUTH  DAILY  . PATADAY 0.2 % SOLN Apply 1 drop to eye daily.  . polyethylene glycol (MIRALAX / GLYCOLAX) 17 g packet Take 17 g by mouth daily as needed for mild constipation.  . pregabalin (LYRICA) 150 MG capsule Take 1 capsule (150 mg total) by mouth at bedtime.  Marland Kitchen QUEtiapine (SEROQUEL) 50 MG tablet Take 1 tablet (50 mg total) by mouth at bedtime.  . roflumilast (DALIRESP) 500 MCG TABS tablet Take 1 tablet (500 mcg total) by mouth daily.  . traZODone (DESYREL) 100 MG tablet TAKE 1 TABLET BY MOUTH AT  BEDTIME  . venlafaxine XR (EFFEXOR-XR) 150 MG 24 hr capsule TAKE 1 CAPSULE BY MOUTH  DAILY WITH BREAKFAST   No facility-administered encounter medications on file as of 07/04/2020.    Patient Active Problem List   Diagnosis Date Noted  . PTSD (post-traumatic stress disorder) 02/09/2020  . Mood disorder in conditions classified elsewhere 02/09/2020  . Generalized anxiety disorder 02/09/2020  . Type II diabetes mellitus with manifestations ([place]) 10/29/2019  . Coarse tremors 10/28/2019  . Chronic rhinitis 10/21/2019  . Allergic rhinitis 06/16/2019  . Cervical spondylosis without myelopathy 12/18/2018  . Hypertension   . Encounter for general adult medical examination with abnormal findings 11/06/2018   . Tobacco abuse 01/15/2018  . Hypomagnesemia 12/29/2017  . Pulmonary nodule 11/11/2017  . Carotid artery disease ([place]) 11/05/2017  . OSA on CPAP 08/07/2017  . Gastroesophageal reflux disease 06/23/2017  . Hypothyroidism due to acquired atrophy of thyroid 04/05/2017  . Primary osteoarthritis involving multiple joints 04/05/2017  . Chronic constipation 02/10/2017  . Iron deficiency anemia due to dietary causes 02/10/2017  . Osteoarthritis 02/10/2017  . Hyperlipidemia associated with type 2 diabetes mellitus ([place]) 02/10/2017  . Chronic respiratory failure with hypoxia ([place]) 12/16/2016  . Essential hypertension 11/29/2016  . Vocal cord dysfunction   . COPD, severe ([place]) 09/24/2016  . Anxiety 09/24/2016    Conditions to be addressed/monitored: Depression; Housing barriers and Mental Health Concerns   Care Plan : [place]  Updates made by Deirdre Peer, LCSW since 07/05/2020 12:00 AM    Problem: Need for patient-centered plan of care to promote improved mental health and overal quality of life.   Priority: High    Long-Range Goal: Develop self management plan to improve pt's mental health and overall health/quality of life.   Start Date: 05/09/2020  Expected End Date: 07/25/2020  This Visit's Progress: On track  Recent Progress: On track  Priority: High  Note:   Current barriers:   Patient continues to reside with her brother while seeking alternative housing. Pt reports things are fairly calm and "better" with her brother- "my brother has said I can stay but I'm still looking for my own place".  She is also contacting her insurance to ask if she will be able to keep her [place] Providers IF she moves to [place]; she is looking at rental property that is is [place].  Pt also reports she may want to pursue more frequent/intense counseling; "my brother says I need to go to inpatient care to deal with my past....but I suppressed it and not sure  I want to". CSW advised pt that she is likely not in need of inpt care but would encourage her to consider and discuss this further with her counselor and Providers.   Pt also admits to not being very motivated; "still smoking and not eating right". Pt states she does not eat all day and then eats the wrong things. CSW will ask RNCM to follow up as appropriate with the nutrition concerns.  . Patient in need of assistance with connecting to community resources for Limited social support, Housing barriers, Mental Health Concerns, Family and relationship dysfunction, and lacks knowledge of community resource: housing. . Acknowledges deficits with meeting this unmet need . Patient is unable to independently navigate community resource options without care coordination support . Patient with limited ability to complete applications for housing due to reported reading limitations Clinical Goals:  patient will work with SW to address concerns related to mental health support/needs patient will work with Care Guide  to address needs related to housing and other community needs as identified patient will follow up with Psychiatrist and Counselor to further discuss concerns and needs as directed by SW Clinical Interventions:  . Collaboration with [name], MD regarding development and update of comprehensive plan of care as evidenced by provider attestation and co-signature . Pt reports continued mental health support/sessions with her outpatient therapist and denies any current significant depression/anxiety. Pt denies SI/HI . Inter-disciplinary care team collaboration (see longitudinal plan of care) . Assessment of needs, barriers , agencies contacted, as well as how impacting  . Review various resources, discussed options and provided patient information about Financial constraints related to housing, etc, Limited social support, Housing barriers, Mental Health Concerns , and Family and  relationship dysfunction . Collaborated with appropriate clinical care team members regarding patient needs Financial constraints related to housing, Limited social support, Housing barriers, Mental Health Concerns , and Family and relationship dysfunction, COPD, Anxiety, and Depression . Patient interviewed and appropriate assessments performed . Referred patient to community resources care guide team for assistance with housing and other community support/resources as identified . Provided mental health counseling with regard to pt concerns/needs (mental health diagnosis or concern) . Discussed plans with patient for ongoing care management follow up and provided patient with direct contact information for care management team . Other interventions provided: o Motivational Interviewing o Solution-Focused Strategies o Brief CBT o Emotional/Supportive Counseling o Psychoeducation and/or Health Education o Problem Solving Patient Goals:   -continue seeking housing options and follow up with them asap -ask sister and others to assist with paperwork for housing applications  - begin a notebook of services in my neighborhood or community and resources provided by [employer], RNCM and CSW - call 911 when I need emergency help -  follow-up on any referrals for help I am given and seek housing options on my own in the local paper and community - think ahead to make sure my need does not become an emergency - make a note about what I need to have by the phone or take with me, like an identification card or social security number have a back-up plan - have a back-up plan - make a list of family or friends that I can call  -follow up with counselor and Psychiatrist at scheduled appointment times to further discuss mental health concerns/needs for more frequent visits as discussed -make Neurologist aware of concerns related to memory issues, recent stuttering,etc -consider seeing a Nutritionist/RD for  healthier diet/choices  Follow Up Plan: Appointment scheduled for SW follow up with client by phone on:  07/25/2020      Follow Up Plan: Appointment scheduled for SW follow up with client by phone on: 07/25/2020      Eduard Clos MSW, [place] Licensed Clinical Social Worker [place] [phone]

## 2020-07-05 NOTE — Patient Instructions (Signed)
Visit Information  PATIENT GOALS: Goals Addressed              This Visit's Progress   .  "to work on me" (pt-stated)        Timeframe:  Long-Range Goal Priority:  High Start Date:    05/09/2020                         Expected End Date:   07/25/2020                    Follow Up Date 07/25/2020  -continue seeking housing options and follow up with them asap -ask sister and others to assist with paperwork for housing applications  - begin a notebook of services in my neighborhood or community and resources provided by [employer], RNCM and CSW - call 911 when I need emergency help - follow-up on any referrals for help I am given and seek housing options on my own in the local paper and community - think ahead to make sure my need does not become an emergency - make a note about what I need to have by the phone or take with me, like an identification card or social security number have a back-up plan - have a back-up plan - make a list of family or friends that I can call  -follow up with counselor and Psychiatrist at scheduled appointment times to further discuss mental health concerns/needs for more frequent visits as discussed -make Neurologist aware of concerns related to memory issues, recent stuttering,etc -consider seeing a Nutritionist/RD for healthier diet/choices Why is this important?    Knowing how and where to find help for yourself or family in your neighborhood and community is an important skill.   You will want to take some steps to learn how.    Notes:       The patient verbalized understanding of instructions, educational materials, and care plan provided today and declined offer to receive copy of patient instructions, educational materials, and care plan.   Telephone follow up appointment with care management team member scheduled for: 07/25/2020  Eduard Clos MSW, LCSW Licensed Clinical Social Worker [place] [phone]

## 2020-07-06 DIAGNOSIS — R519 Headache, unspecified: Secondary | ICD-10-CM | POA: Diagnosis not present

## 2020-07-06 DIAGNOSIS — M47812 Spondylosis without myelopathy or radiculopathy, cervical region: Secondary | ICD-10-CM | POA: Diagnosis not present

## 2020-07-06 DIAGNOSIS — M5412 Radiculopathy, cervical region: Secondary | ICD-10-CM | POA: Diagnosis not present

## 2020-07-07 ENCOUNTER — Telehealth: Payer: Self-pay | Admitting: Pulmonary Disease

## 2020-07-07 NOTE — Telephone Encounter (Signed)
Assessment & Plan Note by [name], MD at 06/30/2020 8:39 PM  Author: [name], MD Author Type: Physician Filed: 06/30/2020 8:39 PM  Note Status: Written Cosign: Cosign Not Required Encounter Date: 06/30/2020  Problem: COPD, severe ([place])  Editor: [name], MD (Physician)               I gave her a sample of Breztri today.  She will try this for 2 weeks and report back if this works. We may have to support her to obtain assistance from the company if this works for her       Called and spoke with pt who stated that she had been feeling shaky and wanted to know if she was supposed to be using all of her inhalers all together. Stated to pt while she was using the [facility] sample that she was given that she was ONLY to use that inhaler. Said to her that the albuterol/combivent inhalers are ONLY for backup for the [facility] and that she was NOT to use the Symbicort while using the Breztri due to the [place] containing 3 meds in that inhaler. Pt verbalized understanding. Nothing further needed.

## 2020-07-08 DIAGNOSIS — J441 Chronic obstructive pulmonary disease with (acute) exacerbation: Secondary | ICD-10-CM | POA: Diagnosis not present

## 2020-07-08 DIAGNOSIS — J9611 Chronic respiratory failure with hypoxia: Secondary | ICD-10-CM | POA: Diagnosis not present

## 2020-07-08 DIAGNOSIS — J449 Chronic obstructive pulmonary disease, unspecified: Secondary | ICD-10-CM | POA: Diagnosis not present

## 2020-07-10 ENCOUNTER — Telehealth: Payer: Self-pay | Admitting: Pulmonary Disease

## 2020-07-10 NOTE — Telephone Encounter (Signed)
Called and spoke with patient. She stated that [employer] has received the results of her qualifying walk that was done on 06/30/20. I looked at her office visit and do not see where her walk was documented. I advised that I would reach out to the person who walked her to see if they have possibly documented the walk elsewhere.   Teams message has been sent to [place].

## 2020-07-13 ENCOUNTER — Other Ambulatory Visit: Payer: Self-pay | Admitting: Pulmonary Disease

## 2020-07-13 ENCOUNTER — Other Ambulatory Visit: Payer: Self-pay | Admitting: Internal Medicine

## 2020-07-13 NOTE — Telephone Encounter (Signed)
Cherina, please advise on Jessica's response regarding the walk. Thanks.

## 2020-07-17 ENCOUNTER — Other Ambulatory Visit: Payer: Self-pay | Admitting: Internal Medicine

## 2020-07-17 ENCOUNTER — Telehealth: Payer: Self-pay | Admitting: Pulmonary Disease

## 2020-07-17 NOTE — Telephone Encounter (Signed)
I will have the team from [facility] reach out to her to enroll in the home pulmonary rehab program

## 2020-07-17 NOTE — Telephone Encounter (Signed)
  RA please advise.  There was not an order placed for the rehab program.  Thanks   Return in about 3 months (around 09/30/2020). Evaluate for POC STOP smoking !! Trial of Breztri 2 puffs twice daily - call me back for Rx if this works We will try to get you into a rehab program

## 2020-07-18 ENCOUNTER — Telehealth: Payer: Self-pay | Admitting: *Deleted

## 2020-07-18 DIAGNOSIS — J449 Chronic obstructive pulmonary disease, unspecified: Secondary | ICD-10-CM

## 2020-07-18 NOTE — Telephone Encounter (Signed)
CSW received a call from pt today stating she is continuing to have difficulty with housing options.  CSW will ask Care Guide to contact pt and to assist her with resources for housing/housing support.   Eduard Clos MSW, LCSW Licensed Clinical Social Worker [facility] [place] [phone]

## 2020-07-18 NOTE — Telephone Encounter (Signed)
I spoke with Joan [name] and she stated that she did walk the patient during her OV. She updated the Snapshot information but did not document the walk. I have sent a community message over to [facility] to see if they can still use this information. I have asked that they sent the community message to triage.

## 2020-07-19 ENCOUNTER — Ambulatory Visit (INDEPENDENT_AMBULATORY_CARE_PROVIDER_SITE_OTHER): Payer: Medicare Other | Admitting: Licensed Clinical Social Worker

## 2020-07-19 DIAGNOSIS — F411 Generalized anxiety disorder: Secondary | ICD-10-CM | POA: Diagnosis not present

## 2020-07-19 DIAGNOSIS — F4310 Post-traumatic stress disorder, unspecified: Secondary | ICD-10-CM | POA: Diagnosis not present

## 2020-07-19 DIAGNOSIS — R443 Hallucinations, unspecified: Secondary | ICD-10-CM

## 2020-07-19 DIAGNOSIS — F0630 Mood disorder due to known physiological condition, unspecified: Secondary | ICD-10-CM
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
## 2020-07-20 ENCOUNTER — Telehealth: Payer: Self-pay | Admitting: Internal Medicine

## 2020-07-20 ENCOUNTER — Telehealth: Payer: Self-pay | Admitting: *Deleted

## 2020-07-20 NOTE — Telephone Encounter (Signed)
Joan [name] called and was wondering if a fax was received for a [place] worker form. Please advise   Phone: [phone] Fax: [phone] Order #: [id number]

## 2020-07-20 NOTE — Telephone Encounter (Signed)
   Telephone encounter was:  Successful.  07/20/2020 Name: Joan [name] MRN: [id number] DOB: 10-Dec-1960  Joan [name] is a 60 y.o. year old female who is a primary care patient of [name], MD . The community resource team was consulted for assistance with housing   Care guide performed the following interventions: Patient provided with information about care guide support team and interviewed to confirm resource needs.  Follow Up Plan:  Care guide will follow up with patient by phone over the next moving   [facility] , [place], Care Management  [phone] [address] , [place] [address] Email : [email or web address]

## 2020-07-20 NOTE — Telephone Encounter (Signed)
   Telephone encounter was:  Unsuccessful.  07/20/2020 Name: Joan [name] MRN: [id number] DOB: [birth year]  Unsuccessful outbound call made today to assist with:  housing  Outreach Attempt:  1st Attempt  A HIPAA compliant voice message was left requesting a return call.  Instructed patient to call back at   Instructed patient to call back at [phone]  at their earliest convenience.   [facility], Care Management  [phone] [address] , [place] [address] Email : [email or web address]

## 2020-07-21 NOTE — Telephone Encounter (Signed)
LVM for Joan [name] with Alvis Lemmings to give our office a call. We have not received any paperwork. Office number was provided.

## 2020-07-24 ENCOUNTER — Other Ambulatory Visit: Payer: Self-pay | Admitting: Pulmonary Disease

## 2020-07-24 MED ORDER — ALBUTEROL SULFATE HFA 108 (90 BASE) MCG/ACT IN AERS: 2.0000 | INHALATION_SPRAY | Freq: Four times a day (QID) | RESPIRATORY_TRACT | 3 refills | Status: DC | PRN

## 2020-07-25 ENCOUNTER — Ambulatory Visit: Payer: Medicare Other | Admitting: *Deleted

## 2020-07-25 DIAGNOSIS — J449 Chronic obstructive pulmonary disease, unspecified: Secondary | ICD-10-CM

## 2020-07-25 DIAGNOSIS — F32A Depression, unspecified: Secondary | ICD-10-CM

## 2020-07-25 DIAGNOSIS — E118 Type 2 diabetes mellitus with unspecified complications: Secondary | ICD-10-CM

## 2020-07-25 DIAGNOSIS — I10 Essential (primary) hypertension: Secondary | ICD-10-CM | POA: Diagnosis not present

## 2020-07-25 DIAGNOSIS — F419 Anxiety disorder, unspecified: Secondary | ICD-10-CM

## 2020-07-25 DIAGNOSIS — F411 Generalized anxiety disorder: Secondary | ICD-10-CM

## 2020-07-27 NOTE — Chronic Care Management (AMB) (Signed)
Chronic Care Management    Clinical Social Work Note  07/27/2020 Name: Joan [name] MRN: [id number] DOB: 1960-09-20  Joan [name] is a 60 y.o. year old female who is a primary care patient of Sharlet Salina Real Cons, MD. The CCM team was consulted to assist the patient with chronic disease management and/or care coordination needs related to: [employer]  and [place] and Resources.   Engaged with patient by telephone for follow up visit in response to provider referral for social work chronic care management and care coordination services.   Consent to Services:  The patient was given information about Chronic Care Management services, agreed to services, and gave verbal consent prior to initiation of services.  Please see initial visit note for detailed documentation.   Patient agreed to services and consent obtained.   Assessment: Review of patient past medical history, allergies, medications, and health status, including review of relevant consultants reports was performed today as part of a comprehensive evaluation and provision of chronic care management and care coordination services.     SDOH (Social Determinants of Health) assessments and interventions performed:    Advanced Directives Status: Not addressed in this encounter.  CCM Care Plan  Allergies  Allergen Reactions  . Keflex [Cephalexin] Rash and Other (See Comments)    Has tolerated amoxicillin since had keflex  . Prednisone Other (See Comments)    "counter reacts"  . Shellfish Allergy Shortness Of Breath, Nausea And Vomiting and Other (See Comments)    Stomach cramps  . Tetracyclines & Related Anaphylaxis, Swelling and Other (See Comments)    Throat swelling requiring hospitalization  . Dilaudid [Hydromorphone Hcl] Other (See Comments)    "Lethargy"  . Incruse Ellipta [Umeclidinium Bromide] Nausea Only  . Other   . Tuberculin Tests Other (See Comments)    False postive     Outpatient Encounter Medications as of 07/25/2020  Medication Sig  . acetaminophen (TYLENOL) 500 MG tablet Take 2 tablets (1,000 mg total) by mouth every 8 (eight) hours as needed for mild pain.  Marland Kitchen acyclovir (ZOVIRAX) 400 MG tablet TAKE 1 TABLET BY MOUTH  TWICE DAILY  . albuterol (PROVENTIL) (2.5 MG/3ML) 0.083% nebulizer solution Take 3 mLs (2.5 mg total) by nebulization 2 (two) times daily. And every 6 hours as needed.  DX: J44.9  . albuterol (VENTOLIN HFA) 108 (90 Base) MCG/ACT inhaler Inhale 2 puffs into the lungs every 6 (six) hours as needed for wheezing or shortness of breath.  Marland Kitchen atenolol (TENORMIN) 25 MG tablet TAKE 1 TABLET BY MOUTH  DAILY  . atorvastatin (LIPITOR) 20 MG tablet TAKE 1 TABLET BY MOUTH  DAILY  . Budeson-Glycopyrrol-Formoterol (BREZTRI AEROSPHERE) 160-9-4.8 MCG/ACT AERO Inhale 2 puffs into the lungs in the morning and at bedtime.  . budesonide-formoterol (SYMBICORT) 160-4.5 MCG/ACT inhaler Inhale 2 puffs into the lungs 2 (two) times daily.  Marland Kitchen buPROPion (WELLBUTRIN XL) 150 MG 24 hr tablet Take 1 tablet (150 mg total) by mouth daily.  . Camphor-Menthol-Methyl Sal 3.03-02-08 % PTCH   . cyclobenzaprine (FLEXERIL) 10 MG tablet TAKE 1 TABLET BY MOUTH AT  BEDTIME  . diazepam (VALIUM) 5 MG tablet Take oral route 45 minutes prior to procedure. Repeat PRN 20 minutes.  . diazepam (VALIUM) 5 MG tablet Take by mouth.  . hydrochlorothiazide (HYDRODIURIL) 25 MG tablet TAKE 1 TABLET BY MOUTH  DAILY  . Ipratropium-Albuterol (COMBIVENT RESPIMAT) 20-100 MCG/ACT AERS respimat Inhale 1 puff into the lungs every 6 (six) hours as needed for wheezing. (Patient  taking differently: Inhale 1 puff into the lungs in the morning and at bedtime. Take additional puff if needed at bedtime)  . levothyroxine (SYNTHROID) 25 MCG tablet TAKE 1 TABLET BY MOUTH  DAILY BEFORE BREAKFAST  . Liniments (SALONPAS ARTHRITIS PAIN RELIEF EX) Apply 1 patch topically daily as needed (to painful sites- remove as directed).    . Melatonin 10 MG TABS Take 10 mg by mouth at bedtime.  . montelukast (SINGULAIR) 10 MG tablet TAKE 1 TABLET BY MOUTH AT  BEDTIME  . naproxen (NAPROSYN) 500 MG tablet TAKE 1 TABLET BY MOUTH  TWICE DAILY WITH A MEAL  . nystatin-triamcinolone ointment (MYCOLOG) Apply 1 application topically 2 (two) times daily as needed (applied to skin folds/groins/under breast skin irritation rash.). (Patient taking differently: Apply 1 application topically 2 (two) times daily as needed (to skin folds/groin/under breast skin- for irritation/rashes).)  . OXYGEN Inhale 2-3 L/min into the lungs See admin instructions. Inhale  2-3 L/min of oxygen into the lungs w/CPAP at bedtime and as needed for shortness of breath with "strenuous activity" during the day  . pantoprazole (PROTONIX) 40 MG tablet TAKE 1 TABLET BY MOUTH  DAILY  . PATADAY 0.2 % SOLN Apply 1 drop to eye daily.  . polyethylene glycol (MIRALAX / GLYCOLAX) 17 g packet Take 17 g by mouth daily as needed for mild constipation.  . pregabalin (LYRICA) 150 MG capsule TAKE 1 CAPSULE BY MOUTH AT  BEDTIME  . QUEtiapine (SEROQUEL) 50 MG tablet Take 1 tablet (50 mg total) by mouth at bedtime.  . roflumilast (DALIRESP) 500 MCG TABS tablet Take 1 tablet (500 mcg total) by mouth daily.  . traZODone (DESYREL) 100 MG tablet TAKE 1 TABLET BY MOUTH AT  BEDTIME  . venlafaxine XR (EFFEXOR-XR) 150 MG 24 hr capsule TAKE 1 CAPSULE BY MOUTH  DAILY WITH BREAKFAST   No facility-administered encounter medications on file as of 07/25/2020.    Patient Active Problem List   Diagnosis Date Noted  . PTSD (post-traumatic stress disorder) 02/09/2020  . Mood disorder in conditions classified elsewhere 02/09/2020  . Generalized anxiety disorder 02/09/2020  . Type II diabetes mellitus with manifestations ([facility]) 10/29/2019  . Coarse tremors 10/28/2019  . Chronic rhinitis 10/21/2019  . Allergic rhinitis 06/16/2019  . Cervical spondylosis without myelopathy 12/18/2018  . Hypertension    . Encounter for general adult medical examination with abnormal findings 11/06/2018  . Tobacco abuse 01/15/2018  . Hypomagnesemia 12/29/2017  . Pulmonary nodule 11/11/2017  . Carotid artery disease ([place]) 11/05/2017  . OSA on CPAP 08/07/2017  . Gastroesophageal reflux disease 06/23/2017  . Hypothyroidism due to acquired atrophy of thyroid 04/05/2017  . Primary osteoarthritis involving multiple joints 04/05/2017  . Chronic constipation 02/10/2017  . Iron deficiency anemia due to dietary causes 02/10/2017  . Osteoarthritis 02/10/2017  . Hyperlipidemia associated with type 2 diabetes mellitus ([place]) 02/10/2017  . Chronic respiratory failure with hypoxia ([place]) 12/16/2016  . Essential hypertension 11/29/2016  . Vocal cord dysfunction   . COPD, severe ([facility] [place]) 09/24/2016  . Anxiety 09/24/2016    Conditions to be addressed/monitored: Anxiety, Depression, Pulmonary Disease and housing needs/issues; Housing barriers, Mental Health Concerns  and Family and relationship dysfunction  Care Plan : LCSW Plan Of Care  Updates made by Deirdre Peer, LCSW since 07/27/2020 12:00 AM    Problem: Need for patient-centered plan of care to promote improved mental health and overal quality of life.   Priority: High    Long-Range Goal: Develop self management plan to  improve pt's mental health and overall health/quality of life.   Start Date: 05/09/2020  Expected End Date: 07/25/2020  This Visit's Progress: On track  Recent Progress: On track  Priority: High  Note:   Current barriers:   Patient continues to reside with her brother while seeking alternative housing. Pt reports things are fairly calm and "better" with her brother- "my brother has said I can stay but I'm still looking for my own place".  She is also contacting her insurance to ask if she will be able to keep her [place] Prov[employer] IF she moves to [place]; she is looking at rental property that is is [place] the state [place].  Pt  also reports she may want to pursue more frequent/intense counseling; "my brother says I need to go to inpatient care to deal with my past....but I suppressed it and not sure I want to". CSW advised pt that she is likely not in need of inpt care but would encourage her to consider and discuss this further with her counselor and Providers.   Pt also admits to not being very motivated; "still smoking and not eating right". Pt states she does not eat all day and then eats the wrong things. CSW will ask RNCM to follow up as appropriate with the nutrition concerns.  . Patient in need of assistance with connecting to community resources for Limited social support, Housing barriers, Mental Health Concerns, Family and relationship dysfunction, and lacks knowledge of community resource: housing. . Acknowledges deficits with meeting this unmet need . Patient is unable to independently navigate community resource options without care coordination support . Patient with limited ability to complete applications for housing due to reported reading limitations Clinical Goals:  patient will work with SW to address concerns related to mental health support/needs patient will work with Care Guide  to address needs related to housing and other community needs as identified patient will follow up with Psychiatrist and Counselor to further discuss concerns and needs as directed by SW Clinical Interventions:  Pt reports she is still seeking housing is frustrated with the challenges faced. CSW offered support and inquired about the resources provided by CSW and Care Guide- encouraged her to follow up on these as well as to search for other sites around her area.    . Collaboration with [name], MD regarding development and update of comprehensive plan of care as evidenced by provider attestation and co-signature . Pt reports continued mental health support/sessions with her outpatient therapist and denies any  current significant depression/anxiety. Pt denies SI/HI . Inter-disciplinary care team collaboration (see longitudinal plan of care) . Assessment of needs, barriers , agencies contacted, as well as how impacting  . Review various resources, discussed options and provided patient information about Financial constraints related to housing, etc, Limited social support, Housing barriers, Mental Health Concerns , and Family and relationship dysfunction . Collaborated with appropriate clinical care team members regarding patient needs Financial constraints related to housing, Limited social support, Housing barriers, Mental Health Concerns , and Family and relationship dysfunction, COPD, Anxiety, and Depression . Patient interviewed and appropriate assessments performed . Referred patient to community resources care guide team for assistance with housing and other community support/resources as identified . Provided mental health counseling with regard to pt concerns/needs (mental health diagnosis or concern) . Discussed plans with patient for ongoing care management follow up and provided patient with direct contact information for care management team . Other interventions provided:  o Motivational Interviewing o Solution-Focused Strategies o Brief CBT o Emotional/Supportive Counseling o Psychoeducation and/or Health Education o Problem Solving Patient Goals:   - call 911 when I need emergency help - follow-up on any referrals for help I am given and seek housing options on my own in the local paper and community - think ahead to make sure my need does not become an emergency - make a note about what I need to have by the phone or take with me, like an identification card or social security number have a back-up plan - have a back-up plan - make a list of family or friends that I can call  -follow up with counselor and Psychiatrist at scheduled appointment times to further discuss mental health  concerns/needs for more frequent visits as discussed -make Neurologist aware of concerns related to memory issues, recent stuttering,etc -consider seeing a Nutritionist/RD for healthier diet/choices  Follow Up Plan: Appointment scheduled for SW follow up with client by phone on:  08/09/2020      Follow Up Plan: Appointment scheduled for SW follow up with client by phone on: 08/09/2020      Eduard Clos MSW, [place] Licensed Clinical Social Worker [place] [phone]

## 2020-07-27 NOTE — Patient Instructions (Signed)
Visit Information  PATIENT GOALS: Goals Addressed              This Visit's Progress   .  "to work on me" (pt-stated)        Timeframe:  Long-Range Goal Priority:  High Start Date:    05/09/2020                         Expected End Date:   10/25/2020                    Follow Up Date 08/09/2020  -continue seeking housing options and follow up with them asap -ask sister and others to assist with paperwork for housing applications  - begin a notebook of services in my neighborhood or community and resources provided by [employer], RNCM and CSW - call 911 when I need emergency help - follow-up on any referrals for help I am given and seek housing options on my own in the local paper and community - think ahead to make sure my need does not become an emergency - make a note about what I need to have by the phone or take with me, like an identification card or social security number have a back-up plan - have a back-up plan - make a list of family or friends that I can call  -follow up with counselor and Psychiatrist at scheduled appointment times to further discuss mental health concerns/needs for more frequent visits as discussed -make Neurologist aware of concerns related to memory issues, recent stuttering,etc -consider seeing a Nutritionist/RD for healthier diet/choices -      Why is this important?    Knowing how and where to find help for yourself or family in your neighborhood and community is an important skill.   You will want to take some steps to learn how.    Notes:        The patient verbalized understanding of instructions, educational materials, and care plan provided today and declined offer to receive copy of patient instructions, educational materials, and care plan.   Telephone follow up appointment with care management team member scheduled for:08/09/2020  Eduard Clos MSW, LCSW Licensed Clinical Social Worker [place] [phone]

## 2020-07-28 ENCOUNTER — Telehealth: Payer: Self-pay | Admitting: Pulmonary Disease

## 2020-07-28 NOTE — Telephone Encounter (Signed)
Left message for patient to call back  

## 2020-07-31 ENCOUNTER — Telehealth (HOSPITAL_COMMUNITY): Payer: Medicare Other | Admitting: Psychiatry

## 2020-08-02 ENCOUNTER — Ambulatory Visit (INDEPENDENT_AMBULATORY_CARE_PROVIDER_SITE_OTHER): Payer: Medicare Other | Admitting: Licensed Clinical Social Worker

## 2020-08-02 DIAGNOSIS — F4310 Post-traumatic stress disorder, unspecified: Secondary | ICD-10-CM

## 2020-08-02 DIAGNOSIS — F0630 Mood disorder due to known physiological condition, unspecified: Secondary | ICD-10-CM | POA: Diagnosis not present

## 2020-08-02 DIAGNOSIS — F411 Generalized anxiety disorder: Secondary | ICD-10-CM

## 2020-08-02 DIAGNOSIS — R443 Hallucinations, unspecified: Secondary | ICD-10-CM

## 2020-08-02 NOTE — Telephone Encounter (Signed)
New message sent to [facility] and [facility] to see if anything further is needed for pt's O2.
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
## 2020-08-03 MED ORDER — BREZTRI AEROSPHERE 160-9-4.8 MCG/ACT IN AERO
2.0000 | INHALATION_SPRAY | Freq: Two times a day (BID) | RESPIRATORY_TRACT | 0 refills | Status: DC
Start: 2020-08-03 — End: 2020-10-18

## 2020-08-03 NOTE — Telephone Encounter (Signed)
Sample of breztri has been placed up front for pt. Called and spoke with pt letting her know this had been done and she verbalized understanding. Nothing further needed.

## 2020-08-03 NOTE — Telephone Encounter (Signed)
Called and spoke to pt. Informed her of the information from below. She states she has an appt with [employer] to evaluate her for the POC. Pt also states she would like another sample of Breztri to make sure it works well before filling her script. Will forward to triage to put sample up front for pt then call to let her know when its ready. Message can be closed after that.   -----------------------------------------------  Herbert Deaner, RN; Skeet Latch; Miquel Dunn   Hello,   Yes the POC evaluation was received and is currently with our scheduling team. They will reach out to the patient in the order it's been received and schedule it with the patient.   Thanks!

## 2020-08-03 NOTE — Telephone Encounter (Signed)
See phone note from 07/10/20. Will close encounter.

## 2020-08-07 ENCOUNTER — Inpatient Hospital Stay (HOSPITAL_COMMUNITY)
Admission: EM | Admit: 2020-08-07 | Discharge: 2020-08-22 | DRG: 190 | Disposition: A | Discharge status: Skilled Nursing Facility | Payer: Medicare Other | Attending: Family Medicine | Admitting: Family Medicine

## 2020-08-07 ENCOUNTER — Emergency Department (HOSPITAL_COMMUNITY): Payer: Medicare Other

## 2020-08-07 ENCOUNTER — Other Ambulatory Visit: Payer: Self-pay

## 2020-08-07 ENCOUNTER — Encounter (HOSPITAL_COMMUNITY): Payer: Self-pay | Admitting: Emergency Medicine

## 2020-08-07 ENCOUNTER — Telehealth (HOSPITAL_COMMUNITY): Payer: Medicare Other | Admitting: Psychiatry

## 2020-08-07 DIAGNOSIS — J439 Emphysema, unspecified: Principal | ICD-10-CM | POA: Diagnosis present

## 2020-08-07 DIAGNOSIS — E118 Type 2 diabetes mellitus with unspecified complications: Secondary | ICD-10-CM | POA: Diagnosis present

## 2020-08-07 DIAGNOSIS — L89626 Pressure-induced deep tissue damage of left heel: Secondary | ICD-10-CM | POA: Diagnosis not present

## 2020-08-07 DIAGNOSIS — F419 Anxiety disorder, unspecified: Secondary | ICD-10-CM | POA: Diagnosis present

## 2020-08-07 DIAGNOSIS — E7800 Pure hypercholesterolemia, unspecified: Secondary | ICD-10-CM | POA: Diagnosis present

## 2020-08-07 DIAGNOSIS — R918 Other nonspecific abnormal finding of lung field: Secondary | ICD-10-CM | POA: Diagnosis not present

## 2020-08-07 DIAGNOSIS — I10 Essential (primary) hypertension: Secondary | ICD-10-CM | POA: Diagnosis present

## 2020-08-07 DIAGNOSIS — J440 Chronic obstructive pulmonary disease with acute lower respiratory infection: Secondary | ICD-10-CM | POA: Diagnosis not present

## 2020-08-07 DIAGNOSIS — F4310 Post-traumatic stress disorder, unspecified: Secondary | ICD-10-CM | POA: Diagnosis present

## 2020-08-07 DIAGNOSIS — R443 Hallucinations, unspecified: Secondary | ICD-10-CM | POA: Diagnosis not present

## 2020-08-07 DIAGNOSIS — F411 Generalized anxiety disorder: Secondary | ICD-10-CM | POA: Diagnosis present

## 2020-08-07 DIAGNOSIS — F0630 Mood disorder due to known physiological condition, unspecified: Secondary | ICD-10-CM | POA: Diagnosis not present

## 2020-08-07 DIAGNOSIS — Z794 Long term (current) use of insulin: Secondary | ICD-10-CM | POA: Diagnosis not present

## 2020-08-07 DIAGNOSIS — F320 Major depressive disorder, single episode, mild: Secondary | ICD-10-CM | POA: Diagnosis not present

## 2020-08-07 DIAGNOSIS — J9622 Acute and chronic respiratory failure with hypercapnia: Secondary | ICD-10-CM | POA: Diagnosis present

## 2020-08-07 DIAGNOSIS — B379 Candidiasis, unspecified: Secondary | ICD-10-CM | POA: Diagnosis not present

## 2020-08-07 DIAGNOSIS — R251 Tremor, unspecified: Secondary | ICD-10-CM | POA: Diagnosis not present

## 2020-08-07 DIAGNOSIS — Z9989 Dependence on other enabling machines and devices: Secondary | ICD-10-CM | POA: Diagnosis not present

## 2020-08-07 DIAGNOSIS — R1310 Dysphagia, unspecified: Secondary | ICD-10-CM | POA: Diagnosis not present

## 2020-08-07 DIAGNOSIS — E034 Atrophy of thyroid (acquired): Secondary | ICD-10-CM | POA: Diagnosis not present

## 2020-08-07 DIAGNOSIS — E878 Other disorders of electrolyte and fluid balance, not elsewhere classified: Secondary | ICD-10-CM | POA: Diagnosis not present

## 2020-08-07 DIAGNOSIS — J209 Acute bronchitis, unspecified: Secondary | ICD-10-CM

## 2020-08-07 DIAGNOSIS — Z6836 Body mass index (BMI) 36.0-36.9, adult: Secondary | ICD-10-CM | POA: Diagnosis not present

## 2020-08-07 DIAGNOSIS — Z9981 Dependence on supplemental oxygen: Secondary | ICD-10-CM | POA: Diagnosis not present

## 2020-08-07 DIAGNOSIS — J441 Chronic obstructive pulmonary disease with (acute) exacerbation: Secondary | ICD-10-CM | POA: Diagnosis not present

## 2020-08-07 DIAGNOSIS — R5381 Other malaise: Secondary | ICD-10-CM | POA: Diagnosis not present

## 2020-08-07 DIAGNOSIS — Z91013 Allergy to seafood: Secondary | ICD-10-CM

## 2020-08-07 DIAGNOSIS — E1140 Type 2 diabetes mellitus with diabetic neuropathy, unspecified: Secondary | ICD-10-CM | POA: Diagnosis present

## 2020-08-07 DIAGNOSIS — J310 Chronic rhinitis: Secondary | ICD-10-CM | POA: Diagnosis not present

## 2020-08-07 DIAGNOSIS — J9621 Acute and chronic respiratory failure with hypoxia: Secondary | ICD-10-CM | POA: Diagnosis present

## 2020-08-07 DIAGNOSIS — R0602 Shortness of breath: Secondary | ICD-10-CM | POA: Diagnosis not present

## 2020-08-07 DIAGNOSIS — N179 Acute kidney failure, unspecified: Secondary | ICD-10-CM | POA: Diagnosis not present

## 2020-08-07 DIAGNOSIS — F32A Depression, unspecified: Secondary | ICD-10-CM | POA: Diagnosis not present

## 2020-08-07 DIAGNOSIS — E1165 Type 2 diabetes mellitus with hyperglycemia: Secondary | ICD-10-CM | POA: Diagnosis present

## 2020-08-07 DIAGNOSIS — M4712 Other spondylosis with myelopathy, cervical region: Secondary | ICD-10-CM | POA: Diagnosis not present

## 2020-08-07 DIAGNOSIS — D72828 Other elevated white blood cell count: Secondary | ICD-10-CM | POA: Diagnosis present

## 2020-08-07 DIAGNOSIS — E871 Hypo-osmolality and hyponatremia: Secondary | ICD-10-CM | POA: Diagnosis present

## 2020-08-07 DIAGNOSIS — R079 Chest pain, unspecified: Secondary | ICD-10-CM | POA: Diagnosis not present

## 2020-08-07 DIAGNOSIS — R0603 Acute respiratory distress: Secondary | ICD-10-CM | POA: Diagnosis not present

## 2020-08-07 DIAGNOSIS — Z20822 Contact with and (suspected) exposure to covid-19: Secondary | ICD-10-CM | POA: Diagnosis not present

## 2020-08-07 DIAGNOSIS — F319 Bipolar disorder, unspecified: Secondary | ICD-10-CM | POA: Diagnosis present

## 2020-08-07 DIAGNOSIS — T380X5A Adverse effect of glucocorticoids and synthetic analogues, initial encounter: Secondary | ICD-10-CM | POA: Diagnosis not present

## 2020-08-07 DIAGNOSIS — Z7401 Bed confinement status: Secondary | ICD-10-CM | POA: Diagnosis not present

## 2020-08-07 DIAGNOSIS — M6281 Muscle weakness (generalized): Secondary | ICD-10-CM | POA: Diagnosis not present

## 2020-08-07 DIAGNOSIS — J449 Chronic obstructive pulmonary disease, unspecified: Secondary | ICD-10-CM | POA: Diagnosis not present

## 2020-08-07 DIAGNOSIS — R739 Hyperglycemia, unspecified: Secondary | ICD-10-CM

## 2020-08-07 DIAGNOSIS — R0609 Other forms of dyspnea: Secondary | ICD-10-CM | POA: Diagnosis not present

## 2020-08-07 DIAGNOSIS — F17210 Nicotine dependence, cigarettes, uncomplicated: Secondary | ICD-10-CM | POA: Diagnosis present

## 2020-08-07 DIAGNOSIS — E876 Hypokalemia: Secondary | ICD-10-CM | POA: Diagnosis present

## 2020-08-07 DIAGNOSIS — Z79890 Hormone replacement therapy (postmenopausal): Secondary | ICD-10-CM

## 2020-08-07 DIAGNOSIS — E119 Type 2 diabetes mellitus without complications: Secondary | ICD-10-CM | POA: Diagnosis not present

## 2020-08-07 DIAGNOSIS — Z96643 Presence of artificial hip joint, bilateral: Secondary | ICD-10-CM | POA: Diagnosis present

## 2020-08-07 DIAGNOSIS — Z741 Need for assistance with personal care: Secondary | ICD-10-CM | POA: Diagnosis not present

## 2020-08-07 DIAGNOSIS — M79604 Pain in right leg: Secondary | ICD-10-CM | POA: Diagnosis not present

## 2020-08-07 DIAGNOSIS — N760 Acute vaginitis: Secondary | ICD-10-CM | POA: Diagnosis present

## 2020-08-07 DIAGNOSIS — J984 Other disorders of lung: Secondary | ICD-10-CM | POA: Diagnosis not present

## 2020-08-07 DIAGNOSIS — I2510 Atherosclerotic heart disease of native coronary artery without angina pectoris: Secondary | ICD-10-CM | POA: Diagnosis not present

## 2020-08-07 DIAGNOSIS — Z981 Arthrodesis status: Secondary | ICD-10-CM

## 2020-08-07 DIAGNOSIS — Z7951 Long term (current) use of inhaled steroids: Secondary | ICD-10-CM

## 2020-08-07 DIAGNOSIS — R2689 Other abnormalities of gait and mobility: Secondary | ICD-10-CM | POA: Diagnosis not present

## 2020-08-07 DIAGNOSIS — G8929 Other chronic pain: Secondary | ICD-10-CM | POA: Diagnosis present

## 2020-08-07 DIAGNOSIS — R1311 Dysphagia, oral phase: Secondary | ICD-10-CM | POA: Diagnosis not present

## 2020-08-07 DIAGNOSIS — R911 Solitary pulmonary nodule: Secondary | ICD-10-CM | POA: Diagnosis not present

## 2020-08-07 DIAGNOSIS — Z833 Family history of diabetes mellitus: Secondary | ICD-10-CM

## 2020-08-07 DIAGNOSIS — Z8249 Family history of ischemic heart disease and other diseases of the circulatory system: Secondary | ICD-10-CM

## 2020-08-07 DIAGNOSIS — J9611 Chronic respiratory failure with hypoxia: Secondary | ICD-10-CM | POA: Diagnosis not present

## 2020-08-07 DIAGNOSIS — G4733 Obstructive sleep apnea (adult) (pediatric): Secondary | ICD-10-CM | POA: Diagnosis not present

## 2020-08-07 DIAGNOSIS — K219 Gastro-esophageal reflux disease without esophagitis: Secondary | ICD-10-CM | POA: Diagnosis not present

## 2020-08-07 DIAGNOSIS — M1990 Unspecified osteoarthritis, unspecified site: Secondary | ICD-10-CM | POA: Diagnosis not present

## 2020-08-07 DIAGNOSIS — Z79899 Other long term (current) drug therapy: Secondary | ICD-10-CM

## 2020-08-07 DIAGNOSIS — D508 Other iron deficiency anemias: Secondary | ICD-10-CM | POA: Diagnosis not present

## 2020-08-07 DIAGNOSIS — R0789 Other chest pain: Secondary | ICD-10-CM | POA: Diagnosis not present

## 2020-08-07 DIAGNOSIS — R6889 Other general symptoms and signs: Secondary | ICD-10-CM | POA: Diagnosis not present

## 2020-08-07 DIAGNOSIS — J383 Other diseases of vocal cords: Secondary | ICD-10-CM | POA: Diagnosis not present

## 2020-08-07 DIAGNOSIS — Z743 Need for continuous supervision: Secondary | ICD-10-CM | POA: Diagnosis not present

## 2020-08-07 LAB — CBC WITH DIFFERENTIAL/PLATELET
Abs Immature Granulocytes: 0.06 10*3/uL (ref 0.00–0.07)
Basophils Absolute: 0.1 10*3/uL (ref 0.0–0.1)
Basophils Relative: 1 %
Eosinophils Absolute: 0.2 10*3/uL (ref 0.0–0.5)
Eosinophils Relative: 2 %
HCT: 39.8 % (ref 36.0–46.0)
Hemoglobin: 13.1 g/dL (ref 12.0–15.0)
Immature Granulocytes: 1 %
Lymphocytes Relative: 14 %
Lymphs Abs: 1.3 10*3/uL (ref 0.7–4.0)
MCH: 30.5 pg (ref 26.0–34.0)
MCHC: 32.9 g/dL (ref 30.0–36.0)
MCV: 92.6 fL (ref 80.0–100.0)
Monocytes Absolute: 0.3 10*3/uL (ref 0.1–1.0)
Monocytes Relative: 3 %
Neutro Abs: 7.4 10*3/uL (ref 1.7–7.7)
Neutrophils Relative %: 79 %
Platelets: 222 10*3/uL (ref 150–400)
RBC: 4.3 MIL/uL (ref 3.87–5.11)
RDW: 14.6 % (ref 11.5–15.5)
WBC: 9.3 10*3/uL (ref 4.0–10.5)
nRBC: 0 % (ref 0.0–0.2)

## 2020-08-07 LAB — COMPREHENSIVE METABOLIC PANEL
ALT: 30 U/L (ref 0–44)
AST: 29 U/L (ref 15–41)
Albumin: 4 g/dL (ref 3.5–5.0)
Alkaline Phosphatase: 117 U/L (ref 38–126)
Anion gap: 10 (ref 5–15)
BUN: 6 mg/dL (ref 6–20)
CO2: 32 mmol/L (ref 22–32)
Calcium: 9 mg/dL (ref 8.9–10.3)
Chloride: 92 mmol/L — ABNORMAL LOW (ref 98–111)
Creatinine, Ser: 0.84 mg/dL (ref 0.44–1.00)
GFR, Estimated: >60 mL/min (ref >60)
Glucose, Bld: 175 mg/dL — ABNORMAL HIGH (ref 70–99)
Potassium: 3.3 mmol/L — ABNORMAL LOW (ref 3.5–5.1)
Sodium: 134 mmol/L — ABNORMAL LOW (ref 135–145)
Total Bilirubin: 0.3 mg/dL (ref 0.3–1.2)
Total Protein: 7.1 g/dL (ref 6.5–8.1)

## 2020-08-07 LAB — TROPONIN I (HIGH SENSITIVITY): Troponin I (High Sensitivity): 3 ng/L (ref <18)

## 2020-08-07 LAB — RESP PANEL BY RT-PCR (FLU A&B, COVID) ARPGX2
Influenza A by PCR: NEGATIVE
Influenza B by PCR: NEGATIVE
SARS Coronavirus 2 by RT PCR: NEGATIVE

## 2020-08-07 LAB — LIPASE, BLOOD: Lipase: 32 U/L (ref 11–51)

## 2020-08-07 LAB — BRAIN NATRIURETIC PEPTIDE: B Natriuretic Peptide: 76.7 pg/mL (ref 0.0–100.0)

## 2020-08-07 MED ORDER — GUAIFENESIN ER 600 MG PO TB12
600.0000 mg | ORAL_TABLET | Freq: Two times a day (BID) | ORAL | Status: DC
  Administered 2020-08-07 – 2020-08-10 (×6): 600 mg via ORAL
  Filled 2020-08-07 (×6): qty 1

## 2020-08-07 MED ORDER — VENLAFAXINE HCL ER 150 MG PO CP24
150.0000 mg | ORAL_CAPSULE | Freq: Every day | ORAL | Status: DC
  Administered 2020-08-08 – 2020-08-22 (×14): 150 mg via ORAL
  Filled 2020-08-07 (×9): qty 1
  Filled 2020-08-07: qty 2
  Filled 2020-08-07 (×5): qty 1

## 2020-08-07 MED ORDER — ALBUTEROL SULFATE HFA 108 (90 BASE) MCG/ACT IN AERS
6.0000 | INHALATION_SPRAY | Freq: Once | RESPIRATORY_TRACT | Status: AC
  Administered 2020-08-07: 6 via RESPIRATORY_TRACT
  Filled 2020-08-07: qty 6.7

## 2020-08-07 MED ORDER — PREGABALIN 75 MG PO CAPS
150.0000 mg | ORAL_CAPSULE | Freq: Every day | ORAL | Status: DC
  Administered 2020-08-07 – 2020-08-21 (×15): 150 mg via ORAL
  Filled 2020-08-07 (×5): qty 2
  Filled 2020-08-07: qty 3
  Filled 2020-08-07 (×9): qty 2

## 2020-08-07 MED ORDER — AEROCHAMBER Z-STAT PLUS/MEDIUM MISC
1.0000 | Freq: Once | Status: AC
  Administered 2020-08-07: 1
  Filled 2020-08-07: qty 1

## 2020-08-07 MED ORDER — BUPROPION HCL ER (XL) 150 MG PO TB24
150.0000 mg | ORAL_TABLET | Freq: Every day | ORAL | Status: DC
  Administered 2020-08-08 – 2020-08-22 (×15): 150 mg via ORAL
  Filled 2020-08-07 (×15): qty 1

## 2020-08-07 MED ORDER — TRAZODONE HCL 100 MG PO TABS
100.0000 mg | ORAL_TABLET | Freq: Every day | ORAL | Status: DC
  Administered 2020-08-07 – 2020-08-08 (×2): 100 mg via ORAL
  Filled 2020-08-07 (×2): qty 1

## 2020-08-07 MED ORDER — TRAZODONE HCL 50 MG PO TABS: 25.0000 mg | ORAL_TABLET | Freq: Every evening | ORAL | Status: DC | PRN

## 2020-08-07 MED ORDER — POLYETHYLENE GLYCOL 3350 17 G PO PACK: 17.0000 g | PACK | Freq: Every day | ORAL | Status: DC | PRN

## 2020-08-07 MED ORDER — CAMPHOR-MENTHOL-METHYL SAL 3.1-6-10 % EX PTCH: 1.0000 | MEDICATED_PATCH | Freq: Every day | CUTANEOUS | Status: DC | PRN

## 2020-08-07 MED ORDER — MAGNESIUM SULFATE 2 GM/50ML IV SOLN
2.0000 g | Freq: Once | INTRAVENOUS | Status: AC
  Administered 2020-08-07: 2 g via INTRAVENOUS
  Filled 2020-08-07: qty 50

## 2020-08-07 MED ORDER — ATORVASTATIN CALCIUM 20 MG PO TABS
20.0000 mg | ORAL_TABLET | Freq: Every day | ORAL | Status: DC
  Administered 2020-08-08 – 2020-08-22 (×15): 20 mg via ORAL
  Filled 2020-08-07 (×3): qty 2
  Filled 2020-08-07 (×3): qty 1
  Filled 2020-08-07: qty 2
  Filled 2020-08-07 (×4): qty 1
  Filled 2020-08-07: qty 2
  Filled 2020-08-07 (×3): qty 1

## 2020-08-07 MED ORDER — HYDROCHLOROTHIAZIDE 25 MG PO TABS
25.0000 mg | ORAL_TABLET | Freq: Every day | ORAL | Status: DC
  Administered 2020-08-08: 25 mg via ORAL
  Filled 2020-08-07: qty 1

## 2020-08-07 MED ORDER — HYDROCOD POLST-CPM POLST ER 10-8 MG/5ML PO SUER
5.0000 mL | Freq: Two times a day (BID) | ORAL | Status: DC | PRN
Start: 2020-08-07 — End: 2020-08-11
  Administered 2020-08-08: 5 mL via ORAL
  Filled 2020-08-07: qty 5

## 2020-08-07 MED ORDER — AZITHROMYCIN 250 MG PO TABS
500.0000 mg | ORAL_TABLET | Freq: Every day | ORAL | Status: AC
  Administered 2020-08-08 – 2020-08-11 (×4): 500 mg via ORAL
  Filled 2020-08-07 (×5): qty 2

## 2020-08-07 MED ORDER — CYCLOBENZAPRINE HCL 10 MG PO TABS
10.0000 mg | ORAL_TABLET | Freq: Every day | ORAL | Status: DC
  Administered 2020-08-07 – 2020-08-10 (×4): 10 mg via ORAL
  Filled 2020-08-07 (×4): qty 1

## 2020-08-07 MED ORDER — MONTELUKAST SODIUM 10 MG PO TABS
10.0000 mg | ORAL_TABLET | Freq: Every day | ORAL | Status: DC
  Administered 2020-08-07 – 2020-08-21 (×15): 10 mg via ORAL
  Filled 2020-08-07 (×15): qty 1

## 2020-08-07 MED ORDER — LEVOTHYROXINE SODIUM 25 MCG PO TABS
25.0000 ug | ORAL_TABLET | Freq: Every day | ORAL | Status: DC
  Administered 2020-08-08 – 2020-08-22 (×15): 25 ug via ORAL
  Filled 2020-08-07 (×15): qty 1

## 2020-08-07 MED ORDER — QUETIAPINE FUMARATE 25 MG PO TABS
50.0000 mg | ORAL_TABLET | Freq: Every day | ORAL | Status: DC
  Administered 2020-08-07 – 2020-08-21 (×15): 50 mg via ORAL
  Filled 2020-08-07 (×7): qty 2
  Filled 2020-08-07 (×2): qty 1
  Filled 2020-08-07 (×6): qty 2

## 2020-08-07 MED ORDER — PANTOPRAZOLE SODIUM 40 MG PO TBEC
40.0000 mg | DELAYED_RELEASE_TABLET | Freq: Every day | ORAL | Status: DC
  Administered 2020-08-08 – 2020-08-15 (×8): 40 mg via ORAL
  Filled 2020-08-07 (×8): qty 1

## 2020-08-07 MED ORDER — ROFLUMILAST 500 MCG PO TABS
500.0000 ug | ORAL_TABLET | Freq: Every day | ORAL | Status: DC
  Administered 2020-08-08 – 2020-08-10 (×3): 500 ug via ORAL
  Filled 2020-08-07 (×3): qty 1

## 2020-08-07 MED ORDER — METHYLPREDNISOLONE SODIUM SUCC 40 MG IJ SOLR
40.0000 mg | Freq: Three times a day (TID) | INTRAMUSCULAR | Status: AC
  Administered 2020-08-07 – 2020-08-08 (×3): 40 mg via INTRAVENOUS
  Filled 2020-08-07 (×3): qty 1

## 2020-08-07 MED ORDER — ENOXAPARIN SODIUM 40 MG/0.4ML IJ SOSY
40.0000 mg | PREFILLED_SYRINGE | INTRAMUSCULAR | Status: DC
  Administered 2020-08-08 – 2020-08-22 (×15): 40 mg via SUBCUTANEOUS
  Filled 2020-08-07 (×15): qty 0.4

## 2020-08-07 MED ORDER — ONDANSETRON HCL 4 MG PO TABS: 4.0000 mg | ORAL_TABLET | Freq: Four times a day (QID) | ORAL | Status: DC | PRN

## 2020-08-07 MED ORDER — ONDANSETRON HCL 4 MG/2ML IJ SOLN: 4.0000 mg | Freq: Four times a day (QID) | INTRAMUSCULAR | Status: DC | PRN

## 2020-08-07 MED ORDER — ATENOLOL 25 MG PO TABS
25.0000 mg | ORAL_TABLET | Freq: Every day | ORAL | Status: DC
  Administered 2020-08-08 – 2020-08-10 (×3): 25 mg via ORAL
  Filled 2020-08-07 (×3): qty 1

## 2020-08-07 MED ORDER — PREDNISONE 20 MG PO TABS
40.0000 mg | ORAL_TABLET | Freq: Every day | ORAL | Status: DC
  Administered 2020-08-09: 40 mg via ORAL
  Filled 2020-08-07: qty 2

## 2020-08-07 MED ORDER — ACETAMINOPHEN 650 MG RE SUPP: 650.0000 mg | Freq: Four times a day (QID) | RECTAL | Status: DC | PRN

## 2020-08-07 MED ORDER — SODIUM CHLORIDE 0.9 % IV SOLN
500.0000 mg | INTRAVENOUS | Status: AC
  Administered 2020-08-07: 500 mg via INTRAVENOUS
  Filled 2020-08-07: qty 500

## 2020-08-07 MED ORDER — OLOPATADINE HCL 0.1 % OP SOLN
1.0000 [drp] | Freq: Two times a day (BID) | OPHTHALMIC | Status: DC
  Administered 2020-08-07 – 2020-08-22 (×29): 1 [drp] via OPHTHALMIC
  Filled 2020-08-07 (×3): qty 5

## 2020-08-07 MED ORDER — ACETAMINOPHEN 325 MG PO TABS
650.0000 mg | ORAL_TABLET | Freq: Four times a day (QID) | ORAL | Status: DC | PRN
  Administered 2020-08-08 – 2020-08-21 (×12): 650 mg via ORAL
  Filled 2020-08-07 (×12): qty 2

## 2020-08-07 MED ORDER — ACYCLOVIR 400 MG PO TABS
400.0000 mg | ORAL_TABLET | Freq: Two times a day (BID) | ORAL | Status: DC
  Administered 2020-08-07 – 2020-08-22 (×30): 400 mg via ORAL
  Filled 2020-08-07 (×30): qty 1

## 2020-08-07 MED ORDER — SODIUM CHLORIDE 0.9 % IV SOLN: INTRAVENOUS | Status: DC

## 2020-08-07 MED ORDER — MAGNESIUM HYDROXIDE 400 MG/5ML PO SUSP
30.0000 mL | Freq: Every day | ORAL | Status: DC | PRN
  Administered 2020-08-21: 30 mL via ORAL
  Filled 2020-08-07 (×2): qty 30

## 2020-08-07 MED ORDER — ALBUTEROL SULFATE (2.5 MG/3ML) 0.083% IN NEBU
2.5000 mg | INHALATION_SOLUTION | Freq: Once | RESPIRATORY_TRACT | Status: AC
  Administered 2020-08-07: 2.5 mg via RESPIRATORY_TRACT
  Filled 2020-08-07: qty 3

## 2020-08-07 NOTE — ED Notes (Signed)
Sandwich and water provided per pt request.

## 2020-08-07 NOTE — ED Provider Notes (Signed)
[facility] DEPT Provider Note   CSN: [id number] Arrival date & time: 08/07/20  1834     History Chief Complaint  Patient presents with   Shortness of Breath    Joan [name] is a 60 y.o. female with a past medical history of COPD, bipolar, PTSD, on 3 L of oxygen at baseline, who presents today for evaluation of shortness of breath. Patient states that over the past week she has had worsening of her baseline shortness of breath.  She states that this has gotten much worse to the point that over the past 3 days she has been unable to care for herself.  She states that she gets very short of breath with even moving around in the bed.  She did not take her medicines today as she was unable to get up out of bed to get them per her report.  With EMS she had 2 DuoNeb's, 125 mg of Solu-Medrol and she states that she feels somewhat better. She has had 2 COVID vaccines, denies any known COVID exposures.  She reports pain in her chest across her bilateral lower chest.  No new leg swelling.    HPI     Past Medical History:  Diagnosis Date   Acute respiratory failure with hypoxia ([place])    Alcoholism and drug addiction in family    Anxiety    Appendicitis    Arthritis    "knees, hips, lower back" (09/25/2016)   Asthma    Bipolar disorder ([place])    Chronic bronchitis ([place])    "all the time" (09/25/2016)   Chronic leg pain    RLE   Chronic lower back pain    COPD (chronic obstructive pulmonary disease) ([place])    Depression    Diabetes mellitus without complication ([place])    Diabetic neuropathy ([place])    Early cataracts, bilateral    Emphysema of lung ([place])    Generalized anxiety disorder 02/09/2020   GERD (gastroesophageal reflux disease)    Headache    Hepatitis C    "treated back in 2001-2002"   High cholesterol    Hypertension    Hypothyroidism    Incarcerated ventral hernia 04/04/2017   Mood disorder in conditions classified elsewhere 02/09/2020    On home oxygen therapy    "2L; at nighttime" (04/04/2017)   OSA on CPAP    CPAP with Oxygen 2 liters   Paranoid ([place])    Pneumonia    "all the time" (09/25/2016)   Positive PPD    with negative chest x ray   PTSD (post-traumatic stress disorder) 02/09/2020   Tachycardia    patient reports with exertion ; denies any other conditions    Thyroid disease    Wears glasses    Wears partial dentures     Patient Active Problem List   Diagnosis Date Noted   COPD exacerbation ([place]) 08/07/2020   PTSD (post-traumatic stress disorder) 02/09/2020   Mood disorder in conditions classified elsewhere 02/09/2020   Generalized anxiety disorder 02/09/2020   Type II diabetes mellitus with manifestations ([place]) 10/29/2019   Coarse tremors 10/28/2019   Chronic rhinitis 10/21/2019   Allergic rhinitis 06/16/2019   Cervical spondylosis without myelopathy 12/18/2018   Hypertension    Encounter for general adult medical examination with abnormal findings 11/06/2018   Tobacco abuse 01/15/2018   Hypomagnesemia 12/29/2017   Pulmonary nodule 11/11/2017   Carotid artery disease ([place]) 11/05/2017   OSA on CPAP 08/07/2017   Gastroesophageal reflux disease 06/23/2017  Hypothyroidism due to acquired atrophy of thyroid 04/05/2017   Primary osteoarthritis involving multiple joints 04/05/2017   Chronic constipation 02/10/2017   Iron deficiency anemia due to dietary causes 02/10/2017   Osteoarthritis 02/10/2017   Hyperlipidemia associated with type 2 diabetes mellitus ([place]) 02/10/2017   Chronic respiratory failure with hypoxia ([place]) 12/16/2016   Essential hypertension 11/29/2016   Vocal cord dysfunction    COPD, severe ([place]) 09/24/2016   Anxiety 09/24/2016    Past Surgical History:  Procedure Laterality Date   BACK SURGERY     BIOPSY  11/24/2018   Procedure: BIOPSY;  Surgeon: Thornton Park, MD;  Location: [facility];  Service: Gastroenterology;;   CARDIAC CATHETERIZATION     COLONOSCOPY     COLONOSCOPY  WITH PROPOFOL N/A 11/24/2018   Procedure: COLONOSCOPY WITH PROPOFOL;  Surgeon: Thornton Park, MD;  Location: [facility];  Service: Gastroenterology;  Laterality: N/A;   CYST EXCISION     removal of cyst in sinuses   DILATION AND CURETTAGE OF UTERUS     ESOPHAGOGASTRODUODENOSCOPY (EGD) WITH PROPOFOL N/A 11/24/2018   Procedure: ESOPHAGOGASTRODUODENOSCOPY (EGD) WITH PROPOFOL;  Surgeon: Thornton Park, MD;  Location: [facility];  Service: Gastroenterology;  Laterality: N/A;   FOOT SURGERY Bilateral    bone removed from 4th and 5 th toes and put in pin then took pin out   [place]  ~ 2014/2015   "removed mesh & infection"   [place] N/A 04/04/2017   Procedure: LAPAROSCOPIC INCISIONAL HERNIA REPAIR WITH MESH;  Surgeon: Fanny Skates, MD;  Location: [facility];  Service: General;  Laterality: N/A;   LACERATION REPAIR Right    repair wrist artery from laceration from ice skate   LAPAROSCOPIC APPENDECTOMY N/A 03/31/2019   Procedure: APPENDECTOMY LAPAROSCOPIC;  Surgeon: Ralene Ok, MD;  Location: [facility];  Service: General;  Laterality: N/A;   LAPAROSCOPIC APPENDECTOMY N/A 07/09/2019   Procedure: APPENDECTOMY LAPAROSCOPIC;  Surgeon: Ralene Ok, MD;  Location: [facility];  Service: General;  Laterality: N/A;   LAPAROSCOPIC INCISIONAL / UMBILICAL / [place]  04/04/2017   LAPAROSCOPIC INCISIONAL HERNIA REPAIR WITH MESH   LIPOMA EXCISION Right    fattty lipoma in neck   NASAL SEPTUM SURGERY     POSTERIOR LUMBAR FUSION  2015/2016 X 2   "added rods and screws"   SINOSCOPY     TOTAL HIP ARTHROPLASTY Right 02/05/2017   Procedure: RIGHT TOTAL HIP ARTHROPLASTY;  Surgeon: [name], MD;  Location: [facility];  Service: Orthopedics;  Laterality: Right;   TOTAL HIP ARTHROPLASTY Left 06/18/2017   Procedure: LEFT TOTAL HIP ARTHROPLASTY;  Surgeon: [name], MD;  Location: [facility];  Service: Orthopedics;  Laterality: Left;   TOTAL HIP  REVISION Left 01/26/2019   Procedure: TOTAL HIP REVISION;  Surgeon: Paralee Cancel, MD;  Location: [facility];  Service: Orthopedics;  Laterality: Left;  2 hrs   TUBAL LIGATION     UMBILICAL HERNIA REPAIR  ~ 2013   w/mesh   UPPER GI ENDOSCOPY       OB History     Gravida  3   Para  3   Term      Preterm      AB      Living  3      SAB      IAB      Ectopic      Multiple      Live Births  Family History  Problem Relation Age of Onset   Diabetes Mother    Hypertension Mother    Hypertension Father    Cancer Father        lung    Social History   Tobacco Use   Smoking status: Every Day    Packs/day: 0.50    Years: 44.00    Pack years: 22.00    Types: Cigarettes   Smokeless tobacco: Never   Tobacco comments:    down to 0.5ppd 06/30/20/jm  Vaping Use   Vaping Use: Never used  Substance Use Topics   Alcohol use: Not Currently   Drug use: Not Currently    Types: "Crack" cocaine    Comment:  "nothing since 01/12/2012"    Home Medications Prior to Admission medications   Medication Sig Start Date End Date Taking? Authorizing Provider  acyclovir (ZOVIRAX) 400 MG tablet TAKE 1 TABLET BY MOUTH  TWICE DAILY 03/29/20  Yes [name], MD  albuterol (PROVENTIL) (2.5 MG/3ML) 0.083% nebulizer solution Take 3 mLs (2.5 mg total) by nebulization 2 (two) times daily. And every 6 hours as needed.  DX: J44.9 03/31/20  Yes [name], MD  albuterol (VENTOLIN HFA) 108 (90 Base) MCG/ACT inhaler Inhale 2 puffs into the lungs every 6 (six) hours as needed for wheezing or shortness of breath. Patient taking differently: Inhale 2 puffs into the lungs in the morning and at bedtime. 07/24/20  Yes [name], MD  atenolol (TENORMIN) 25 MG tablet TAKE 1 TABLET BY MOUTH  DAILY 03/08/20  Yes [name], MD  atorvastatin (LIPITOR) 20 MG tablet TAKE 1 TABLET BY MOUTH  DAILY 03/08/20  Yes [name], MD  Budeson-Glycopyrrol-Formoterol  (BREZTRI AEROSPHERE) 160-9-4.8 MCG/ACT AERO Inhale 2 puffs into the lungs in the morning and at bedtime. 08/03/20  Yes [name], MD  budesonide-formoterol (SYMBICORT) 160-4.5 MCG/ACT inhaler Inhale 2 puffs into the lungs 2 (two) times daily.   Yes [provider]  buPROPion (WELLBUTRIN XL) 150 MG 24 hr tablet Take 1 tablet (150 mg total) by mouth daily. 05/25/20  Yes [name], MD  Camphor-Menthol-Methyl Sal 3.03-02-08 % PTCH Apply 1 patch topically daily as needed (pain).   Yes [provider]  cyclobenzaprine (FLEXERIL) 10 MG tablet TAKE 1 TABLET BY MOUTH AT  BEDTIME 07/17/20  Yes [name], MD  diazepam (VALIUM) 5 MG tablet Take oral route 45 minutes prior to procedure. Repeat PRN 20 minutes. 04/25/20  Yes [provider]  hydrochlorothiazide (HYDRODIURIL) 25 MG tablet TAKE 1 TABLET BY MOUTH  DAILY 03/08/20  Yes [name], MD  Ipratropium-Albuterol (COMBIVENT RESPIMAT) 20-100 MCG/ACT AERS respimat Inhale 1 puff into the lungs every 6 (six) hours as needed for wheezing. Patient taking differently: Inhale 1 puff into the lungs every 6 (six) hours as needed for wheezing or shortness of breath. 04/17/18  Yes [name], MD  levothyroxine (SYNTHROID) 25 MCG tablet TAKE 1 TABLET BY MOUTH  DAILY BEFORE BREAKFAST 03/17/20  Yes [name], MD  montelukast (SINGULAIR) 10 MG tablet TAKE 1 TABLET BY MOUTH AT  BEDTIME 03/13/20  Yes [name], MD  naproxen (NAPROSYN) 500 MG tablet TAKE 1 TABLET BY MOUTH  TWICE DAILY WITH A MEAL Patient taking differently: Take 500 mg by mouth 2 (two) times daily as needed for moderate pain. 01/24/20  Yes [name], MD  nystatin-triamcinolone ointment [facility]) Apply 1 application topically 2 (two) times daily as needed (applied to  skin folds/groins/under breast skin irritation rash.). Patient taking differently: Apply 1 application topically 2 (two) times daily as needed (to skin folds/groin/under  breast skin- for irritation/rashes). 09/08/17  Yes [name], MD  pantoprazole (PROTONIX) 40 MG tablet TAKE 1 TABLET BY MOUTH  DAILY 03/08/20  Yes [name], MD  PATADAY 0.2 % SOLN Apply 1 drop to eye daily. 08/20/19  Yes [provider]  polyethylene glycol (MIRALAX / GLYCOLAX) 17 g packet Take 17 g by mouth daily as needed for mild constipation. 04/14/19  Yes Meuth, Brooke A, PA-C  pregabalin (LYRICA) 150 MG capsule TAKE 1 CAPSULE BY MOUTH AT  BEDTIME 07/17/20  Yes [name], MD  QUEtiapine (SEROQUEL) 50 MG tablet Take 1 tablet (50 mg total) by mouth at bedtime. 05/25/20  Yes [name], MD  roflumilast (DALIRESP) 500 MCG TABS tablet Take 1 tablet (500 mcg total) by mouth daily. 11/06/18  Yes [name], NP  traZODone (DESYREL) 100 MG tablet TAKE 1 TABLET BY MOUTH AT  BEDTIME 01/24/20  Yes [name], MD  venlafaxine XR (EFFEXOR-XR) 150 MG 24 hr capsule TAKE 1 CAPSULE BY MOUTH  DAILY WITH BREAKFAST 01/24/20  Yes [name], MD  acetaminophen (TYLENOL) 500 MG tablet Take 2 tablets (1,000 mg total) by mouth every 8 (eight) hours as needed for mild pain. Patient not taking: No sig reported 04/14/19   Meuth, Blaine Hamper, PA-C  [facility] ARTHRITIS PAIN RELIEF EX) Apply 1 patch topically daily as needed (to painful sites- remove as directed).     [provider]  OXYGEN Inhale 2-3 L/min into the lungs See admin instructions. Inhale  2-3 L/min of oxygen into the lungs w/CPAP at bedtime and as needed for shortness of breath with "strenuous activity" during the day    [provider]    Allergies    Keflex [cephalexin], Prednisone, Shellfish allergy, Tetracyclines & related, Dilaudid [hydromorphone hcl], Incruse ellipta [umeclidinium bromide], Other, and Tuberculin tests  Review of Systems   Review of Systems  Constitutional:  Positive for fatigue. Negative for chills and fever.  HENT:  Negative for congestion.    Respiratory:  Positive for cough, chest tightness and shortness of breath.   Cardiovascular:  Positive for chest pain. Negative for palpitations.  Gastrointestinal:  Negative for abdominal pain, constipation, diarrhea and nausea.  Musculoskeletal:  Negative for back pain and neck pain.       Chronic pain in multiple joints at baseline.   Skin:  Negative for color change and rash.  Neurological:  Positive for weakness (Global). Negative for headaches.  Psychiatric/Behavioral:  Negative for confusion.   All other systems reviewed and are negative.  Physical Exam Updated Vital Signs BP 125/66   Pulse 84   Temp (!) 97.1 F (36.2 C) (Axillary) Comment (Src): ongoing breathing treatment  Resp (!) 24   Ht 5\' 8"  (1.727 m)   Wt 108.9 kg   SpO2 94%   BMI 36.49 kg/m   Physical Exam Vitals and nursing note reviewed.  Constitutional:      General: She is not in acute distress.    Appearance: She is not diaphoretic.  HENT:     Head: Normocephalic and atraumatic.  Eyes:     General: No scleral icterus.       Right eye: No discharge.        Left eye: No discharge.     Conjunctiva/sclera: Conjunctivae normal.  Cardiovascular:     Rate and Rhythm:  Normal rate and regular rhythm.  Pulmonary:     Effort: No respiratory distress.     Breath sounds: No stridor. Examination of the right-upper field reveals decreased breath sounds, wheezing and rhonchi. Examination of the left-upper field reveals decreased breath sounds, wheezing and rhonchi. Examination of the right-middle field reveals decreased breath sounds and wheezing. Examination of the left-middle field reveals decreased breath sounds and wheezing. Examination of the right-lower field reveals decreased breath sounds and wheezing. Examination of the left-lower field reveals decreased breath sounds and wheezing. Decreased breath sounds, wheezing and rhonchi present.     Comments: Patient gets very tachypnic with moving around in bed,    Chest:     Chest wall: No tenderness.  Abdominal:     General: There is no distension.     Palpations: Abdomen is soft.     Tenderness: There is no abdominal tenderness. There is no guarding.  Musculoskeletal:        General: No deformity.     Cervical back: Normal range of motion and neck supple.     Right lower leg: No tenderness. No edema.     Left lower leg: No tenderness. No edema.  Skin:    General: Skin is warm and dry.  Neurological:     Mental Status: She is alert.     Motor: No abnormal muscle tone.     Comments: No facial droop, speech is not slurred.   Psychiatric:        Behavior: Behavior normal.    ED Results / Procedures / Treatments   Labs (all labs ordered are listed, but only abnormal results are displayed) Labs Reviewed  COMPREHENSIVE METABOLIC PANEL - Abnormal; Notable for the following components:      Result Value   Sodium 134 (*)    Potassium 3.3 (*)    Chloride 92 (*)    Glucose, Bld 175 (*)    All other components within normal limits  RESP PANEL BY RT-PCR (FLU A&B, COVID) ARPGX2  EXPECTORATED SPUTUM ASSESSMENT W GRAM STAIN, RFLX TO RESP C  CBC WITH DIFFERENTIAL/PLATELET  LIPASE, BLOOD  BRAIN NATRIURETIC PEPTIDE  HIV ANTIBODY (ROUTINE TESTING W REFLEX)  BASIC METABOLIC PANEL  CBC  TROPONIN I (HIGH SENSITIVITY)  TROPONIN I (HIGH SENSITIVITY)    EKG None  Radiology DG Chest Port 1 View  Result Date: 08/07/2020 CLINICAL DATA:  Shortness of breath, chest pain EXAM: PORTABLE CHEST 1 VIEW COMPARISON:  05/05/2019 FINDINGS: Heart and mediastinal contours are within normal limits. No focal opacities or effusions. No acute bony abnormality. IMPRESSION: No active disease. Electronically Signed   By: Rolm Baptise M.D.   On: 08/07/2020 20:03    Procedures Procedures   Medications Ordered in ED Medications                                                                        albuterol (VENTOLIN HFA) 108 (90  Base) MCG/ACT inhaler 6 puff (6 puffs Inhalation Given 08/07/20 2017)  aerochamber Z-Stat Plus/medium 1 each (1 each Other Given 08/07/20 2017)  magnesium sulfate IVPB 2 g 50 mL (0 g Intravenous Stopped 08/07/20 2312)  albuterol (PROVENTIL) (2.5 MG/3ML) 0.083% nebulizer solution 2.5 mg (2.5 mg Nebulization Given 08/07/20 2158)  ED Course  I have reviewed the triage vital signs and the nursing notes.  Pertinent labs & imaging results that were available during my care of the patient were reviewed by me and considered in my medical decision making (see chart for details).    MDM Rules/Calculators/A&P                          Patient is a 60 year old woman who presents today for evaluation of worsening shortness of breath.  She has been so short of breath that she is unable to complete her ADLs at home. On my exam she has significant decreased lung sounds bilaterally with wheezes.  She was treated by EMS prior to arrival with duo nebs, and IV steroids therefore I did not give her IV steroids in the emergency room. Chest x-ray is obtained without acute abnormalities.  CBC is unremarkable, CMP shows mild hypokalemia.  Lipase and BNP are not elevated.  Troponin is not elevated and COVID test and flu test is negative. Patient was given additional albuterol nebs, IV magnesium and albuterol inhalers without significant improvement, and she still remained tachypneic with even moving around in the bed. Patient will require admission for ongoing treatment, I spoke with hospitalist will see patient for admission.   Final Clinical Impression(s) / ED Diagnoses Final diagnoses:  COPD exacerbation [facility])    Rx / DC Orders ED Discharge Orders     None        Ollen Gross 08/08/20 Festus Holts, MD 08/08/20 1452

## 2020-08-07 NOTE — ED Notes (Signed)
Entered room to find patient had removed BP cuff from right arm, pt refusing to allow writer to put bp cuff back on.

## 2020-08-07 NOTE — ED Triage Notes (Signed)
BIBA Per EMS: Pt coming from home with [facility] and chest pain. Pt hx of COPD stage 4. Pt has been experiencing increased SHOB and chest pain over the past week. En route, 2 duonebs given, 125 solumedrol. 20G L AC. 80 HR 140/80 BP 95%

## 2020-08-07 NOTE — ED Notes (Signed)
Pt normally wears 3LNC at baseline.

## 2020-08-07 NOTE — H&P (Signed)
[place]   PATIENT NAME: Joan [name]    MR#:  [id number]  DATE OF BIRTH:  August 25, 1960  DATE OF ADMISSION:  08/07/2020  PRIMARY CARE PHYSICIAN: [name], MD   Patient is coming from: Home  REQUESTING/REFERRING PHYSICIAN: Lorin Glass, PA-C  CHIEF COMPLAINT:   Chief Complaint  Patient presents with  . Shortness of Breath    HISTORY OF PRESENT ILLNESS:  Joan [name] is a 60 y.o. Caucasian female with medical history significant for COPD, bipolar disorder, PTSD, chronic respiratory failure on home O2 at 3 L/min, who presented to the ER with acute onset of worsening dyspnea with associated cough with inability to expectorate as well as wheezing for the last week.  She denied any fever or chills.  No chest pain or palpitations.  No nausea or vomiting.  She admits to mild epigastric abdominal pain with no radiation.  No diarrhea or melena or bright red bleeding per rectum.  No dysuria, oliguria or hematuria or flank pain. ED Course: When she came to the ER temperature was 97.1 with otherwise normal vital signs.  Labs revealed mild hypokalemia of 3.3 and hyponatremia and hypochloremia, blood glucose of 175 and lipase level of 32.  BNP was 76.7 and high-sensitivity troponin I was 30 and later less than 2.  CBC was normal.  Influenza antigens and COVID-19 PCR came back negative. EKG as reviewed by me : Showed normal sinus rhythm with a rate 86 with nonspecific intraventricular conduction delay. Imaging: Chest x-ray showed no acute cardiopulmonary disease.  The patient was given 125 mg of IV Solu-Medrol by EMS as well as DuoNeb and he received albuterol 6 puffs with AeroChamber and later albuterol nebulizer therapy as well as 2 g of IV magnesium sulfate patient will be admitted to a medical monitored bed for further evaluation and management. PAST MEDICAL HISTORY:   Past Medical History:  Diagnosis Date  . Acute respiratory failure with hypoxia ([place])   .  Alcoholism and drug addiction in family   . Anxiety   . Appendicitis   . Arthritis    "knees, hips, lower back" (09/25/2016)  . Asthma   . Bipolar disorder ([place])   . Chronic bronchitis ([place])    "all the time" (09/25/2016)  . Chronic leg pain    RLE  . Chronic lower back pain   . COPD (chronic obstructive pulmonary disease) ()   . Depression   . Diabetes mellitus without complication ([place])   . Diabetic neuropathy ([name])   . Early cataracts, bilateral   . Emphysema of lung ([place])   . Generalized anxiety disorder 02/09/2020  . GERD (gastroesophageal reflux disease)   . Headache   . Hepatitis C    "treated back in 2001-2002"  . High cholesterol   . Hypertension   . Hypothyroidism   . Incarcerated ventral hernia 04/04/2017  . Mood disorder in conditions classified elsewhere 02/09/2020  . On home oxygen therapy    "2L; at nighttime" (04/04/2017)  . OSA on CPAP    CPAP with Oxygen 2 liters  . Paranoid ([place])   . Pneumonia    "all the time" (09/25/2016)  . Positive PPD    with negative chest x ray  . PTSD (post-traumatic stress disorder) 02/09/2020  . Tachycardia    patient reports with exertion ; denies any other conditions   . Thyroid disease   . Wears glasses   . Wears partial dentures     PAST  SURGICAL HISTORY:   Past Surgical History:  Procedure Laterality Date  . BACK SURGERY    . BIOPSY  11/24/2018   Procedure: BIOPSY;  Surgeon: Thornton Park, MD;  Location: [facility];  Service: Gastroenterology;;  . CARDIAC CATHETERIZATION    . COLONOSCOPY    . COLONOSCOPY WITH PROPOFOL N/A 11/24/2018   Procedure: COLONOSCOPY WITH PROPOFOL;  Surgeon: Thornton Park, MD;  Location: [facility];  Service: Gastroenterology;  Laterality: N/A;  . CYST EXCISION     removal of cyst in sinuses  . DILATION AND CURETTAGE OF UTERUS    . ESOPHAGOGASTRODUODENOSCOPY (EGD) WITH PROPOFOL N/A 11/24/2018   Procedure: ESOPHAGOGASTRODUODENOSCOPY (EGD) WITH PROPOFOL;  Surgeon: Thornton Park,  MD;  Location: [facility];  Service: Gastroenterology;  Laterality: N/A;  . FOOT SURGERY Bilateral    bone removed from 4th and 5 th toes and put in pin then took pin out  . HERNIA REPAIR    . INCISION AND DRAINAGE  ~ 2014/2015   "removed mesh & infection"  . INCISIONAL HERNIA REPAIR N/A 04/04/2017   Procedure: LAPAROSCOPIC INCISIONAL HERNIA REPAIR WITH MESH;  Surgeon: Fanny Skates, MD;  Location: [facility];  Service: General;  Laterality: N/A;  . LACERATION REPAIR Right    repair wrist artery from laceration from ice skate  . LAPAROSCOPIC APPENDECTOMY N/A 03/31/2019   Procedure: APPENDECTOMY LAPAROSCOPIC;  Surgeon: Ralene Ok, MD;  Location: [facility];  Service: General;  Laterality: N/A;  . LAPAROSCOPIC APPENDECTOMY N/A 07/09/2019   Procedure: APPENDECTOMY LAPAROSCOPIC;  Surgeon: Ralene Ok, MD;  Location: [facility];  Service: General;  Laterality: N/A;  . LAPAROSCOPIC INCISIONAL / UMBILICAL / [place]  04/04/2017   LAPAROSCOPIC INCISIONAL HERNIA REPAIR WITH MESH  . LIPOMA EXCISION Right    fattty lipoma in neck  . NASAL SEPTUM SURGERY    . POSTERIOR LUMBAR FUSION  2015/2016 X 2   "added rods and screws"  . SINOSCOPY    . TOTAL HIP ARTHROPLASTY Right 02/05/2017   Procedure: RIGHT TOTAL HIP ARTHROPLASTY;  Surgeon: [name], MD;  Location: [facility];  Service: Orthopedics;  Laterality: Right;  . TOTAL HIP ARTHROPLASTY Left 06/18/2017   Procedure: LEFT TOTAL HIP ARTHROPLASTY;  Surgeon: [name], MD;  Location: [facility];  Service: Orthopedics;  Laterality: Left;  . TOTAL HIP REVISION Left 01/26/2019   Procedure: TOTAL HIP REVISION;  Surgeon: Paralee Cancel, MD;  Location: [facility];  Service: Orthopedics;  Laterality: Left;  2 hrs  . TUBAL LIGATION    . UMBILICAL HERNIA REPAIR  ~ 2013   w/mesh  . UPPER GI ENDOSCOPY      SOCIAL HISTORY:   Social History   Tobacco Use  . Smoking status: Every Day    Packs/day: 0.50    Years: 44.00    Pack years: 22.00    Types:  Cigarettes  . Smokeless tobacco: Never  . Tobacco comments:    down to 0.5ppd 06/30/20/jm  Substance Use Topics  . Alcohol use: Not Currently    FAMILY HISTORY:   Family History  Problem Relation Age of Onset  . Diabetes Mother   . Hypertension Mother   . Hypertension Father   . Cancer Father        lung    DRUG ALLERGIES:   Allergies  Allergen Reactions  . Keflex [Cephalexin] Rash and Other (See Comments)    Has tolerated amoxicillin since had keflex  . Prednisone Other (See Comments)    "counter reacts"  . Shellfish Allergy Shortness Of  Breath, Nausea And Vomiting and Other (See Comments)    Stomach cramps  . Tetracyclines & Related Anaphylaxis, Swelling and Other (See Comments)    Throat swelling requiring hospitalization  . Dilaudid [Hydromorphone Hcl] Other (See Comments)    "Lethargy"  . Incruse Ellipta [Umeclidinium Bromide] Nausea Only  . Other   . Tuberculin Tests Other (See Comments)    False postive    REVIEW OF SYSTEMS:   ROS As per history of present illness. All pertinent systems were reviewed above. Constitutional, HEENT, cardiovascular, respiratory, GI, GU, musculoskeletal, neuro, psychiatric, endocrine, integumentary and hematologic systems were reviewed and are otherwise negative/unremarkable except for positive findings mentioned above in the HPI.   MEDICATIONS AT HOME:   Prior to Admission medications   Medication Sig Start Date End Date Taking? Authorizing Provider  acyclovir (ZOVIRAX) 400 MG tablet TAKE 1 TABLET BY MOUTH  TWICE DAILY 03/29/20  Yes [name], MD  albuterol (PROVENTIL) (2.5 MG/3ML) 0.083% nebulizer solution Take 3 mLs (2.5 mg total) by nebulization 2 (two) times daily. And every 6 hours as needed.  DX: J44.9 03/31/20  Yes [name], MD  albuterol (VENTOLIN HFA) 108 (90 Base) MCG/ACT inhaler Inhale 2 puffs into the lungs every 6 (six) hours as needed for wheezing or shortness of breath. Patient taking differently:  Inhale 2 puffs into the lungs in the morning and at bedtime. 07/24/20  Yes [name], MD  atenolol (TENORMIN) 25 MG tablet TAKE 1 TABLET BY MOUTH  DAILY 03/08/20  Yes [name], MD  atorvastatin (LIPITOR) 20 MG tablet TAKE 1 TABLET BY MOUTH  DAILY 03/08/20  Yes [name], MD  Budeson-Glycopyrrol-Formoterol (BREZTRI AEROSPHERE) 160-9-4.8 MCG/ACT AERO Inhale 2 puffs into the lungs in the morning and at bedtime. 08/03/20  Yes [name], MD  budesonide-formoterol (SYMBICORT) 160-4.5 MCG/ACT inhaler Inhale 2 puffs into the lungs 2 (two) times daily.   Yes [provider]  buPROPion (WELLBUTRIN XL) 150 MG 24 hr tablet Take 1 tablet (150 mg total) by mouth daily. 05/25/20  Yes [name], MD  Camphor-Menthol-Methyl Sal 3.03-02-08 % PTCH Apply 1 patch topically daily as needed (pain).   Yes [provider]  cyclobenzaprine (FLEXERIL) 10 MG tablet TAKE 1 TABLET BY MOUTH AT  BEDTIME 07/17/20  Yes [name], MD  diazepam (VALIUM) 5 MG tablet Take oral route 45 minutes prior to procedure. Repeat PRN 20 minutes. 04/25/20  Yes [provider]  hydrochlorothiazide (HYDRODIURIL) 25 MG tablet TAKE 1 TABLET BY MOUTH  DAILY 03/08/20  Yes [name], MD  Ipratropium-Albuterol (COMBIVENT RESPIMAT) 20-100 MCG/ACT AERS respimat Inhale 1 puff into the lungs every 6 (six) hours as needed for wheezing. Patient taking differently: Inhale 1 puff into the lungs every 6 (six) hours as needed for wheezing or shortness of breath. 04/17/18  Yes [name], MD  levothyroxine (SYNTHROID) 25 MCG tablet TAKE 1 TABLET BY MOUTH  DAILY BEFORE BREAKFAST 03/17/20  Yes [name], MD  montelukast (SINGULAIR) 10 MG tablet TAKE 1 TABLET BY MOUTH AT  BEDTIME 03/13/20  Yes [name], MD  naproxen (NAPROSYN) 500 MG tablet TAKE 1 TABLET BY MOUTH  TWICE DAILY WITH A MEAL Patient taking differently: Take 500 mg by mouth 2 (two) times daily as needed for  moderate pain. 01/24/20  Yes [name], MD  nystatin-triamcinolone ointment [facility]) Apply 1 application topically 2 (two) times daily as needed (applied to skin folds/groins/under breast skin irritation rash.).  Patient taking differently: Apply 1 application topically 2 (two) times daily as needed (to skin folds/groin/under breast skin- for irritation/rashes). 09/08/17  Yes [name], MD  pantoprazole (PROTONIX) 40 MG tablet TAKE 1 TABLET BY MOUTH  DAILY 03/08/20  Yes [name], MD  PATADAY 0.2 % SOLN Apply 1 drop to eye daily. 08/20/19  Yes [provider]  polyethylene glycol (MIRALAX / GLYCOLAX) 17 g packet Take 17 g by mouth daily as needed for mild constipation. 04/14/19  Yes Meuth, Brooke A, PA-C  pregabalin (LYRICA) 150 MG capsule TAKE 1 CAPSULE BY MOUTH AT  BEDTIME 07/17/20  Yes [name], MD  QUEtiapine (SEROQUEL) 50 MG tablet Take 1 tablet (50 mg total) by mouth at bedtime. 05/25/20  Yes [name], MD  roflumilast (DALIRESP) 500 MCG TABS tablet Take 1 tablet (500 mcg total) by mouth daily. 11/06/18  Yes [name], NP  traZODone (DESYREL) 100 MG tablet TAKE 1 TABLET BY MOUTH AT  BEDTIME 01/24/20  Yes [name], MD  venlafaxine XR (EFFEXOR-XR) 150 MG 24 hr capsule TAKE 1 CAPSULE BY MOUTH  DAILY WITH BREAKFAST 01/24/20  Yes [name], MD  acetaminophen (TYLENOL) 500 MG tablet Take 2 tablets (1,000 mg total) by mouth every 8 (eight) hours as needed for mild pain. Patient not taking: No sig reported 04/14/19   Meuth, Blaine Hamper, PA-C  [facility] ARTHRITIS PAIN RELIEF EX) Apply 1 patch topically daily as needed (to painful sites- remove as directed).     [provider]  OXYGEN Inhale 2-3 L/min into the lungs See admin instructions. Inhale  2-3 L/min of oxygen into the lungs w/CPAP at bedtime and as needed for shortness of breath with "strenuous activity" during the day    [provider]       VITAL SIGNS:  Blood pressure 125/66, pulse 84, temperature (!) 97.1 F (36.2 C), temperature source Axillary, resp. rate (!) 24, height 5\' 8"  (1.727 m), weight 108.9 kg, SpO2 94 %.  PHYSICAL EXAMINATION:  Physical Exam  GENERAL:  60 y.o.-year-old Caucasian female patient lying in the bed with mild respiratory distress with conversation dyspnea .  EYES: Pupils equal, round, reactive to light and accommodation. No scleral icterus. Extraocular muscles intact.  HEENT: Head atraumatic, normocephalic. Oropharynx and nasopharynx clear.  NECK:  Supple, no jugular venous distention. No thyroid enlargement, no tenderness.  LUNGS: Diffuse expiratory wheezes with tight expiratory airflow and harsh vesicular breathing. CARDIOVASCULAR: Regular rate and rhythm, S1, S2 normal. No murmurs, rubs, or gallops.  ABDOMEN: Soft, nondistended, nontender. Bowel sounds present. No organomegaly or mass.  EXTREMITIES: No pedal edema, cyanosis, or clubbing.  NEUROLOGIC: Cranial nerves II through XII are intact. Muscle strength 5/5 in all extremities. Sensation intact. Gait not checked.  PSYCHIATRIC: The patient is alert and oriented x 3.  Normal affect and good eye contact. SKIN: No obvious rash, lesion, or ulcer.   LABORATORY PANEL:   CBC Recent Labs  Lab 08/07/20 1943  WBC 9.3  HGB 13.1  HCT 39.8  PLT 222   ------------------------------------------------------------------------------------------------------------------  Chemistries  Recent Labs  Lab 08/07/20 1943  NA 134*  K 3.3*  CL 92*  CO2 32  GLUCOSE 175*  BUN 6  CREATININE 0.84  CALCIUM 9.0  AST 29  ALT 30  ALKPHOS 117  BILITOT 0.3   ------------------------------------------------------------------------------------------------------------------  Cardiac Enzymes No results for input(s): TROPONINI in the last 168  hours. ------------------------------------------------------------------------------------------------------------------  RADIOLOGY:  DG Chest Port 1 [address]  Result Date: 08/07/2020 CLINICAL DATA:  Shortness of breath, chest pain EXAM: PORTABLE CHEST 1 VIEW COMPARISON:  05/05/2019 FINDINGS: Heart and mediastinal contours are within normal limits. No focal opacities or effusions. No acute bony abnormality. IMPRESSION: No active disease. Electronically Signed   By: Rolm Baptise M.D.   On: 08/07/2020 20:03      IMPRESSION AND PLAN:  Active Problems:   COPD exacerbation ([place])  1.  COPD acute exacerbation, likely secondary to acute bronchitis. - The patient be admitted to a medical monitored bed. - We will continue steroid therapy with IV Solu-Medrol and bronchodilator therapy with nebulized DuoNebs 4 times daily and every 4 hours as needed. - Mucolytic therapy will be provided. - We will obtain sputum culture. - O2 protocol will be followed.  2.  Hypokalemia. - Potassium will be replaced and magnesium level will be checked.  3.  Mild hyponatremia and hypochloremia. - The patient will be hydrated with IV normal saline and will follow BMP.  4.  Dyslipidemia. - We will continue statin therapy.  5.  Depression. - We will continue Wellbutrin XL and Effexor XR.  6.  Hypothyroidism. - We will continue Synthroid.  7.  GERD. - We will continue PPI therapy.  DVT prophylaxis: Lovenox. Code Status: full code. Family Communication:  The plan of care was discussed in details with the patient (and family). I answered all questions. The patient agreed to proceed with the above mentioned plan. Further management will depend upon hospital course. Disposition Plan: Back to previous home environment Consults called: none. All the records are reviewed and case discussed with ED provider.  Status is: Inpatient  Remains inpatient appropriate because:Ongoing diagnostic testing needed not  appropriate for outpatient work up, Unsafe d/c plan, IV treatments appropriate due to intensity of illness or inability to take PO, and Inpatient level of care appropriate due to severity of illness  Dispo: The patient is from: Home              Anticipated d/c is to: Home              Patient currently is not medically stable to d/c.   Difficult to place patient No   TOTAL TIME TAKING CARE OF THIS PATIENT: 55 minutes.    Christel Mormon M.D on 08/07/2020 at 10:44 PM  [facility]   From 7 PM-7 AM, contact night-coverage [email or web address]  CC: Primary care physician; [name], MD

## 2020-08-07 NOTE — ED Notes (Signed)
Admitting MD at bedside.

## 2020-08-08 DIAGNOSIS — J441 Chronic obstructive pulmonary disease with (acute) exacerbation: Secondary | ICD-10-CM | POA: Diagnosis not present

## 2020-08-08 LAB — CBC
HCT: 37.1 % (ref 36.0–46.0)
Hemoglobin: 12.5 g/dL (ref 12.0–15.0)
MCH: 31.2 pg (ref 26.0–34.0)
MCHC: 33.7 g/dL (ref 30.0–36.0)
MCV: 92.5 fL (ref 80.0–100.0)
Platelets: 204 10*3/uL (ref 150–400)
RBC: 4.01 MIL/uL (ref 3.87–5.11)
RDW: 14.6 % (ref 11.5–15.5)
WBC: 10.8 10*3/uL — ABNORMAL HIGH (ref 4.0–10.5)
nRBC: 0 % (ref 0.0–0.2)

## 2020-08-08 LAB — BASIC METABOLIC PANEL
Anion gap: 10 (ref 5–15)
BUN: 11 mg/dL (ref 6–20)
CO2: 27 mmol/L (ref 22–32)
Calcium: 8.5 mg/dL — ABNORMAL LOW (ref 8.9–10.3)
Chloride: 97 mmol/L — ABNORMAL LOW (ref 98–111)
Creatinine, Ser: 1.21 mg/dL — ABNORMAL HIGH (ref 0.44–1.00)
GFR, Estimated: 52 mL/min — ABNORMAL LOW (ref >60)
Glucose, Bld: 249 mg/dL — ABNORMAL HIGH (ref 70–99)
Potassium: 5.1 mmol/L (ref 3.5–5.1)
Sodium: 134 mmol/L — ABNORMAL LOW (ref 135–145)

## 2020-08-08 LAB — HIV ANTIBODY (ROUTINE TESTING W REFLEX): HIV Screen 4th Generation wRfx: NONREACTIVE

## 2020-08-08 LAB — TROPONIN I (HIGH SENSITIVITY): Troponin I (High Sensitivity): <2 ng/L (ref <18)

## 2020-08-08 LAB — GLUCOSE, CAPILLARY: Glucose-Capillary: 214 mg/dL — ABNORMAL HIGH (ref 70–99)

## 2020-08-08 MED ORDER — NICOTINE 14 MG/24HR TD PT24
14.0000 mg | MEDICATED_PATCH | Freq: Every day | TRANSDERMAL | Status: DC
  Filled 2020-08-08 (×12): qty 1

## 2020-08-08 MED ORDER — IPRATROPIUM-ALBUTEROL 0.5-2.5 (3) MG/3ML IN SOLN
3.0000 mL | Freq: Four times a day (QID) | RESPIRATORY_TRACT | Status: DC
  Administered 2020-08-08 – 2020-08-10 (×11): 3 mL via RESPIRATORY_TRACT
  Filled 2020-08-08 (×12): qty 3

## 2020-08-08 MED ORDER — INSULIN ASPART 100 UNIT/ML IJ SOLN
0.0000 [IU] | Freq: Three times a day (TID) | INTRAMUSCULAR | Status: DC
  Administered 2020-08-08: 5 [IU] via SUBCUTANEOUS
  Administered 2020-08-09 (×2): 8 [IU] via SUBCUTANEOUS
  Administered 2020-08-09 – 2020-08-10 (×3): 3 [IU] via SUBCUTANEOUS
  Administered 2020-08-10: 11 [IU] via SUBCUTANEOUS
  Administered 2020-08-11 (×2): 3 [IU] via SUBCUTANEOUS
  Administered 2020-08-11: 5 [IU] via SUBCUTANEOUS
  Administered 2020-08-12: 3 [IU] via SUBCUTANEOUS
  Administered 2020-08-12: 5 [IU] via SUBCUTANEOUS
  Administered 2020-08-12 – 2020-08-13 (×2): 3 [IU] via SUBCUTANEOUS
  Administered 2020-08-13: 8 [IU] via SUBCUTANEOUS
  Administered 2020-08-14: 2 [IU] via SUBCUTANEOUS
  Administered 2020-08-14: 5 [IU] via SUBCUTANEOUS
  Administered 2020-08-14: 11 [IU] via SUBCUTANEOUS
  Administered 2020-08-15: 3 [IU] via SUBCUTANEOUS
  Administered 2020-08-15: 5 [IU] via SUBCUTANEOUS

## 2020-08-08 NOTE — ED Notes (Signed)
Assisted patient with bedside bath and toilet use. Patient able to perform mostly on her on, however dyspnea on exertion. Patient provided new bed linen, gown, and underwear. Patient currently does not want grip socks on at this time, educated patient on the importance of such, continues to currently deny at this time. Patient sitting up at bedside with call light in reach. Denies further needs at this time.

## 2020-08-08 NOTE — ED Notes (Signed)
Coffee and crackers given

## 2020-08-08 NOTE — Plan of Care (Signed)
  Problem: Education: Goal: Knowledge of General Education information will improve Description: Including pain rating scale, medication(s)/side effects and non-pharmacologic comfort measures Outcome: Progressing   Problem: Clinical Measurements: Goal: Will remain free from infection Outcome: Progressing Goal: Diagnostic test results will improve Outcome: Progressing   Problem: Pain Managment: Goal: General experience of comfort will improve Outcome: Progressing   Problem: Safety: Goal: Ability to remain free from injury will improve Outcome: Progressing   Problem: Skin Integrity: Goal: Risk for impaired skin integrity will decrease Outcome: Progressing

## 2020-08-08 NOTE — Progress Notes (Addendum)
PROGRESS NOTE    Joan [name]  MVH:[id number] DOB: 28-Feb-1960 DOA: 08/07/2020 PCP: [name], MD    Brief Narrative: HPI per Dr. Sidney Ace on 08/07/2020 Joan [name] is a 60 y.o. Caucasian female with medical history significant for COPD, bipolar disorder, PTSD, chronic respiratory failure on home O2 at 3 L/min, who presented to the ER with acute onset of worsening dyspnea with associated cough with inability to expectorate as well as wheezing for the last week.  She denied any fever or chills.  No chest pain or palpitations.  No nausea or vomiting.  She admits to mild epigastric abdominal pain with no radiation.  No diarrhea or melena or bright red bleeding per rectum.  No dysuria, oliguria or hematuria or flank pain. ED Course: When she came to the ER temperature was 97.1 with otherwise normal vital signs.  Labs revealed mild hypokalemia of 3.3 and hyponatremia and hypochloremia, blood glucose of 175 and lipase level of 32.  BNP was 76.7 and high-sensitivity troponin I was 30 and later less than 2.  CBC was normal.  Influenza antigens and COVID-19 PCR came back negative. EKG as reviewed by me : Showed normal sinus rhythm with a rate 86 with nonspecific intraventricular conduction delay. Imaging: Chest x-ray showed no acute cardiopulmonary disease.  The patient was given 125 mg of IV Solu-Medrol by EMS as well as DuoNeb and he received albuterol 6 puffs with AeroChamber and later albuterol nebulizer therapy as well as 2 g of IV magnesium sulfate patient will be admitted to a medical monitored bed for further evaluation and management.  Assessment & Plan:   Active Problems:   COPD exacerbation ([place])   #1 acute hypoxic respiratory failure due to COPD exacerbation.  Patient is an active smoker and uses 3 L of oxygen at home 24/7. She is diffusely wheezing continue IV Solu-Medrol and bronchodilator therapy with nebulizer. Continue azithromycin. Chest x-ray no active  disease. She is maintaining her sats on 3 L however she is diffusely wheezing with severe dyspnea on exertion.  #2 tobacco abuse counseled to quit we will start nicotine patch  #3 history of depression continue Wellbutrin and Effexor  #4 hypothyroidism on Synthroid  #5 GERD on PPI  #6 hyperlipidemia on statin  #7 hypokalemia/mild hyponatremia resolved  #8 AKI creatinine increased to 1.21 from 0.84 on admission.  On acyclovir and HCTZ.  Will hold HCTZ recheck labs in AM.  #9 steroid-induced hyperglycemia her A1c was 7.2 in January 2022.  Continue SSI and recheck A1c this admission.  Estimated body mass index is 36.49 kg/m as calculated from the following:   Height as of this encounter: 5\' 8"  (1.727 m).   Weight as of this encounter: 108.9 kg.  DVT prophylaxis: Lovenox Code Status: Full code Family Communication: None at bedside Disposition Plan:  Status is: Inpatient  Remains inpatient appropriate because:IV treatments appropriate due to intensity of illness or inability to take PO and Inpatient level of care appropriate due to severity of illness  Dispo: The patient is from: Home              Anticipated d/c is to: Home              Patient currently is not medically stable to d/c.   Difficult to place patient No   Consultants:  None  Procedures: None Antimicrobials:   Subjective: Patient is resting in bed actively coughing and wheezing and short of breath  Objective: Vitals:   08/08/20  0354 08/08/20 1000 08/08/20 1218 08/08/20 1316  BP: (!) 158/86 115/82  (!) 139/97  Pulse: (!) 102 89  80  Resp: 20 20  18   Temp:    97.8 F (36.6 C)  TempSrc:    Oral  SpO2: 98% 99% 98% 97%  Weight:      Height:       No intake or output data in the 24 hours ending 08/08/20 1455 Filed Weights   08/07/20 1850  Weight: 108.9 kg    Examination:  General exam: Appears in mild distress due to coughing spells and shortness of breath and wheezing Respiratory system: Diffuse  wheezing bilaterally to auscultation. Respiratory effort normal. Cardiovascular system: S1 & S2 heard, RRR. No JVD, murmurs, rubs, gallops or clicks. No pedal edema. Gastrointestinal system: Abdomen is nondistended, soft and nontender. No organomegaly or masses felt. Normal bowel sounds heard. Central nervous system: Alert and oriented. No focal neurological deficits. Extremities: No edema  skin: No rashes, lesions or ulcers Psychiatry: Judgement and insight appear normal. Mood & affect appropriate.     Data Reviewed: I have personally reviewed following labs and imaging studies  CBC: Recent Labs  Lab 08/07/20 1943 08/08/20 0550  WBC 9.3 10.8*  NEUTROABS 7.4  --   HGB 13.1 12.5  HCT 39.8 37.1  MCV 92.6 92.5  PLT 222 656   Basic Metabolic Panel: Recent Labs  Lab 08/07/20 1943 08/08/20 0550  NA 134* 134*  K 3.3* 5.1  CL 92* 97*  CO2 32 27  GLUCOSE 175* 249*  BUN 6 11  CREATININE 0.84 1.21*  CALCIUM 9.0 8.5*   GFR: Estimated Creatinine Clearance: 65.5 mL/min (A) (by C-G formula based on SCr of 1.21 mg/dL (H)). Liver Function Tests: Recent Labs  Lab 08/07/20 1943  AST 29  ALT 30  ALKPHOS 117  BILITOT 0.3  PROT 7.1  ALBUMIN 4.0   Recent Labs  Lab 08/07/20 1943  LIPASE 32   No results for input(s): AMMONIA in the last 168 hours. Coagulation Profile: No results for input(s): INR, PROTIME in the last 168 hours. Cardiac Enzymes: No results for input(s): CKTOTAL, CKMB, CKMBINDEX, TROPONINI in the last 168 hours. BNP (last 3 results) No results for input(s): PROBNP in the last 8760 hours. HbA1C: No results for input(s): HGBA1C in the last 72 hours. CBG: No results for input(s): GLUCAP in the last 168 hours. Lipid Profile: No results for input(s): CHOL, HDL, LDLCALC, TRIG, CHOLHDL, LDLDIRECT in the last 72 hours. Thyroid Function Tests: No results for input(s): TSH, T4TOTAL, FREET4, T3FREE, THYROIDAB in the last 72 hours. Anemia Panel: No results for  input(s): VITAMINB12, FOLATE, FERRITIN, TIBC, IRON, RETICCTPCT in the last 72 hours. Sepsis Labs: No results for input(s): PROCALCITON, LATICACIDVEN in the last 168 hours.  Recent Results (from the past 240 hour(s))  Resp Panel by RT-PCR (Flu A&B, Covid) Nasopharyngeal Swab     Status: None   Collection Time: 08/07/20  8:10 PM   Specimen: Nasopharyngeal Swab; Nasopharyngeal(NP) swabs in vial transport medium  Result Value Ref Range Status   SARS Coronavirus 2 by RT PCR NEGATIVE NEGATIVE Final    Comment: (NOTE) SARS-CoV-2 target nucleic acids are NOT DETECTED.  The SARS-CoV-2 RNA is generally detectable in upper respiratory specimens during the acute phase of infection. The lowest concentration of SARS-CoV-2 viral copies this assay can detect is 138 copies/mL. A negative result does not preclude SARS-Cov-2 infection and should not be used as the sole basis for treatment or other patient  management decisions. A negative result may occur with  improper specimen collection/handling, submission of specimen other than nasopharyngeal swab, presence of viral mutation(s) within the areas targeted by this assay, and inadequate number of viral copies(<138 copies/mL). A negative result must be combined with clinical observations, patient history, and epidemiological information. The expected result is Negative.  Fact Sheet for Patients:  EntrepreneurPulse.com.au  Fact Sheet for Healthcare Providers:  IncredibleEmployment.be  This test is no t yet approved or cleared by the [place] FDA and  has been authorized for detection and/or diagnosis of SARS-CoV-2 by FDA under an Emergency Use Authorization (EUA). This EUA will remain  in effect (meaning this test can be used) for the duration of the COVID-19 declaration under Section 564(b)(1) of the Act, 21 U.S.C.section 360bbb-3(b)(1), unless the authorization is terminated  or revoked sooner.        Influenza A by PCR NEGATIVE NEGATIVE Final   Influenza B by PCR NEGATIVE NEGATIVE Final    Comment: (NOTE) The Xpert Xpress SARS-CoV-2/FLU/RSV plus assay is intended as an aid in the diagnosis of influenza from Nasopharyngeal swab specimens and should not be used as a sole basis for treatment. Nasal washings and aspirates are unacceptable for Xpert Xpress SARS-CoV-2/FLU/RSV testing.  Fact Sheet for Patients: EntrepreneurPulse.com.au  Fact Sheet for Healthcare Providers: IncredibleEmployment.be  This test is not yet approved or cleared by the [place] FDA and has been authorized for detection and/or diagnosis of SARS-CoV-2 by FDA under an Emergency Use Authorization (EUA). This EUA will remain in effect (meaning this test can be used) for the duration of the COVID-19 declaration under Section 564(b)(1) of the Act, 21 U.S.C. section 360bbb-3(b)(1), unless the authorization is terminated or revoked.  Performed at [facility], [place] [address] [place],  [address]          Radiology Studies: [facility] Chest Port 1 View  Result Date: 08/07/2020 CLINICAL DATA:  Shortness of breath, chest pain EXAM: PORTABLE CHEST 1 VIEW COMPARISON:  05/05/2019 FINDINGS: Heart and mediastinal contours are within normal limits. No focal opacities or effusions. No acute bony abnormality. IMPRESSION: No active disease. Electronically Signed   By: Rolm Baptise M.D.   On: 08/07/2020 20:03        Scheduled Meds:  acyclovir  400 mg Oral BID   atenolol  25 mg Oral Daily   atorvastatin  20 mg Oral Daily   azithromycin  500 mg Oral Daily   buPROPion  150 mg Oral Daily   cyclobenzaprine  10 mg Oral QHS   enoxaparin (LOVENOX) injection  40 mg Subcutaneous Q24H   guaiFENesin  600 mg Oral BID   ipratropium-albuterol  3 mL Nebulization QID   levothyroxine  25 mcg Oral Q0600   montelukast  10 mg Oral QHS   olopatadine  1 drop Both Eyes BID    pantoprazole  40 mg Oral Daily   [START ON 08/09/2020] predniSONE  40 mg Oral Q breakfast   pregabalin  150 mg Oral QHS   QUEtiapine  50 mg Oral QHS   roflumilast  500 mcg Oral Daily   traZODone  100 mg Oral QHS   venlafaxine XR  150 mg Oral Q breakfast   Continuous Infusions:  sodium chloride 75 mL/hr at 08/08/20 0537     LOS: 1 day    Time spent:     Georgette Shell, MD  08/08/2020, 2:55 PM

## 2020-08-08 NOTE — ED Notes (Signed)
Patient resting in bed. States she is concerned for her care at home. States she needs assistance with ADLs at home and does not have a good support team that check in on her at home. Reassured patient that during her stay we can attempt to find solutions to her concerns.

## 2020-08-08 NOTE — ED Notes (Signed)
Breakfast tray given. °

## 2020-08-09 ENCOUNTER — Telehealth: Payer: Medicare Other

## 2020-08-09 DIAGNOSIS — J441 Chronic obstructive pulmonary disease with (acute) exacerbation: Secondary | ICD-10-CM | POA: Diagnosis not present

## 2020-08-09 LAB — COMPREHENSIVE METABOLIC PANEL
ALT: 35 U/L (ref 0–44)
AST: 34 U/L (ref 15–41)
Albumin: 3.9 g/dL (ref 3.5–5.0)
Alkaline Phosphatase: 97 U/L (ref 38–126)
Anion gap: 9 (ref 5–15)
BUN: 17 mg/dL (ref 6–20)
CO2: 27 mmol/L (ref 22–32)
Calcium: 9.4 mg/dL (ref 8.9–10.3)
Chloride: 100 mmol/L (ref 98–111)
Creatinine, Ser: 1.17 mg/dL — ABNORMAL HIGH (ref 0.44–1.00)
GFR, Estimated: 54 mL/min — ABNORMAL LOW (ref >60)
Glucose, Bld: 165 mg/dL — ABNORMAL HIGH (ref 70–99)
Potassium: 5.9 mmol/L — ABNORMAL HIGH (ref 3.5–5.1)
Sodium: 136 mmol/L (ref 135–145)
Total Bilirubin: 1.5 mg/dL — ABNORMAL HIGH (ref 0.3–1.2)
Total Protein: 7.5 g/dL (ref 6.5–8.1)

## 2020-08-09 LAB — CBC
HCT: 42.2 % (ref 36.0–46.0)
Hemoglobin: 13.5 g/dL (ref 12.0–15.0)
MCH: 30.5 pg (ref 26.0–34.0)
MCHC: 32 g/dL (ref 30.0–36.0)
MCV: 95.3 fL (ref 80.0–100.0)
Platelets: 244 10*3/uL (ref 150–400)
RBC: 4.43 MIL/uL (ref 3.87–5.11)
RDW: 15 % (ref 11.5–15.5)
WBC: 21.1 10*3/uL — ABNORMAL HIGH (ref 4.0–10.5)
nRBC: 0 % (ref 0.0–0.2)

## 2020-08-09 LAB — GLUCOSE, CAPILLARY
Glucose-Capillary: 189 mg/dL — ABNORMAL HIGH (ref 70–99)
Glucose-Capillary: 280 mg/dL — ABNORMAL HIGH (ref 70–99)
Glucose-Capillary: 280 mg/dL — ABNORMAL HIGH (ref 70–99)
Glucose-Capillary: 165 mg/dL — ABNORMAL HIGH (ref 70–99)

## 2020-08-09 LAB — HEMOGLOBIN A1C
Hgb A1c MFr Bld: 6.9 % — ABNORMAL HIGH (ref 4.8–5.6)
Mean Plasma Glucose: 151 mg/dL

## 2020-08-09 LAB — BASIC METABOLIC PANEL
Anion gap: 9 (ref 5–15)
BUN: 19 mg/dL (ref 6–20)
CO2: 29 mmol/L (ref 22–32)
Calcium: 9.1 mg/dL (ref 8.9–10.3)
Chloride: 98 mmol/L (ref 98–111)
Creatinine, Ser: 0.96 mg/dL (ref 0.44–1.00)
GFR, Estimated: >60 mL/min (ref >60)
Glucose, Bld: 226 mg/dL — ABNORMAL HIGH (ref 70–99)
Potassium: 4 mmol/L (ref 3.5–5.1)
Sodium: 136 mmol/L (ref 135–145)

## 2020-08-09 MED ORDER — BUDESONIDE 0.5 MG/2ML IN SUSP
0.5000 mg | Freq: Two times a day (BID) | RESPIRATORY_TRACT | Status: DC
  Administered 2020-08-09 – 2020-08-22 (×27): 0.5 mg via RESPIRATORY_TRACT
  Filled 2020-08-09 (×27): qty 2

## 2020-08-09 MED ORDER — METHYLPREDNISOLONE SODIUM SUCC 125 MG IJ SOLR
60.0000 mg | Freq: Two times a day (BID) | INTRAMUSCULAR | Status: DC
  Administered 2020-08-09 – 2020-08-10 (×3): 60 mg via INTRAVENOUS
  Filled 2020-08-09 (×3): qty 2

## 2020-08-09 MED ORDER — ALPRAZOLAM 0.5 MG PO TABS
0.5000 mg | ORAL_TABLET | Freq: Three times a day (TID) | ORAL | Status: DC | PRN
  Administered 2020-08-09 – 2020-08-20 (×27): 0.5 mg via ORAL
  Filled 2020-08-09 (×29): qty 1

## 2020-08-09 MED ORDER — INSULIN GLARGINE 100 UNIT/ML ~~LOC~~ SOLN
10.0000 [IU] | Freq: Every day | SUBCUTANEOUS | Status: DC
  Administered 2020-08-09 – 2020-08-14 (×6): 10 [IU] via SUBCUTANEOUS
  Filled 2020-08-09 (×7): qty 0.1

## 2020-08-09 MED ORDER — ZOLPIDEM TARTRATE 5 MG PO TABS
5.0000 mg | ORAL_TABLET | Freq: Every evening | ORAL | Status: DC | PRN
  Administered 2020-08-09 – 2020-08-10 (×2): 5 mg via ORAL
  Filled 2020-08-09 (×2): qty 1

## 2020-08-09 MED ORDER — BUDESONIDE 0.5 MG/2ML IN SUSP
RESPIRATORY_TRACT | Status: AC
  Filled 2020-08-09: qty 2

## 2020-08-09 MED ORDER — INSULIN ASPART 100 UNIT/ML IJ SOLN
4.0000 [IU] | Freq: Three times a day (TID) | INTRAMUSCULAR | Status: DC
  Administered 2020-08-09 – 2020-08-15 (×17): 4 [IU] via SUBCUTANEOUS

## 2020-08-09 NOTE — Progress Notes (Signed)
Inpatient Diabetes Program Recommendations  AACE/ADA: New Consensus Statement on Inpatient Glycemic Control (2015)  Target Ranges:  Prepandial:   less than 140 mg/dL      Peak postprandial:   less than 180 mg/dL (1-2 hours)      Critically ill patients:  140 - 180 mg/dL   Lab Results  Component Value Date   GLUCAP 280 (H) 08/09/2020   HGBA1C 6.9 (H) 08/08/2020    Review of Glycemic Control Results for Joan [name], Joan [name] (MRN [id number]) as of 08/09/2020 13:44  Ref. Range 08/08/2020 21:27 08/09/2020 07:46 08/09/2020 11:46  Glucose-Capillary Latest Ref Range: 70 - 99 mg/dL 214 (H) 189 (H)  Novolog 3 units 280 (H)  Novolog 8 units   Diabetes history: DM 2 diagnosed 10/2019 Outpatient Diabetes medications: none Current orders for Inpatient glycemic control:  Novolog 0-15 units tid  Solumedrol 60 mg Q12 Current A1c 6.9% on 6/14  Inpatient Diabetes Program Recommendations:    -  Consider adding Lantus 10 units while on steroids -  Add Novolog 4 units tid meal coverage if eating >50% of meals  Thanks, Tama Headings RN, MSN, BC-ADM Inpatient Diabetes Coordinator Team Pager [phone] (8a-5p)

## 2020-08-09 NOTE — Progress Notes (Signed)
PROGRESS NOTE    Joan [name]  CMK:[id number] DOB: [birth year] DOA: 08/07/2020 PCP: [name], MD   Chief Complain: Shortness of breath  Brief Narrative: Patient is a 60 year old female with history of COPD, bipolar disorder, PTSD, chronic respiratory failure on home oxygen at 3 days/min who presented to the emergency department complaints of worsening dyspnea with associated cough, wheezing.  Patient is a current smoker and smokes 1 pack a day.  Chest x-ray on presentation did not show pneumonia.  Patient was admitted for the management of COPD exacerbation, currently on IV steroids.  Assessment & Plan:   Active Problems:   COPD exacerbation ([place])  Acute on chronic hypoxic respiratory failure: Presented with dyspnea, wheezing.  Currently on 3 L of oxygen per minute which is her baseline.  COPD exacerbation: Current smoker.  She still has persistent wheezing.  Continue IV steroids, bronchodilators, incentive spirometer, flutter valve.  Continue cough medicines.Also on azithromycin  Tobacco use: Continues to smokes 1 pack a day.  Not ready for quitting.  Counseled for cessation.  History of depression/bipolar disorder/PTSD: On Wellbutrin, Effexor  Hypothyroidism: On Synthyroid  GERD: PPI  Hyperlipidemia: On statin  AKI: Resolved.  Steroid-induced hyperglycemia: Recent hemoglobin A1c of 7.2 as per January 2022.  Continue sliding scale  insulin.  Monitor blood sugars.  New HbA1c  level pending.  Leukocytosis: Most likely secondary to steroids.  Continue to monitor.  Sleep apnea: On CPAP  Morbid obesity: BMI of 36.4         DVT prophylaxis:Lovenox Code Status: Full Family Communication: None at the bedside Status is: Inpatient  Remains inpatient appropriate because:need of IV steroids  Dispo: The patient is from: Home              Anticipated d/c is to: Home              Patient currently is not medically stable to d/c.   Difficult to place patient  No     Consultants: None  Procedures:None  Antimicrobials:  Anti-infectives (From admission, onward)    Start     Dose/Rate Route Frequency Ordered Stop   08/08/20 2245  azithromycin (ZITHROMAX) tablet 500 mg       See Hyperspace for full Linked Orders Report.   500 mg Oral Daily 08/07/20 2233 08/12/20 0959   08/07/20 2245  acyclovir (ZOVIRAX) tablet 400 mg        400 mg Oral 2 times daily 08/07/20 2233     08/07/20 2245  azithromycin (ZITHROMAX) 500 mg in sodium chloride 0.9 % 250 mL IVPB       See Hyperspace for full Linked Orders Report.   500 mg 250 mL/hr over 60 Minutes Intravenous Every 24 hours 08/07/20 2233 08/08/20 0158       Subjective:  Patient seen and examined the bedside this morning.  Hemodynamically stable.  Still has significant wheezing.  Complains of lack of sleep and cough   Objective: Vitals:   08/08/20 2214 08/09/20 0101 08/09/20 0403 08/09/20 0740  BP: (!) 141/84 (!) 155/86 (!) 141/80   Pulse: 99 88 81   Resp: (!) 24 (!) 23 (!) 23   Temp: 97.6 F (36.4 C) 97.6 F (36.4 C) 97.9 F (36.6 C)   TempSrc: Oral Oral Oral   SpO2: 98% 100% 100% 98%  Weight:      Height:        Intake/Output Summary (Last 24 hours) at 08/09/2020 1200 Last data filed at 08/09/2020 0600 Gross per  24 hour  Intake 1821 ml  Output --  Net 1821 ml   Filed Weights   08/07/20 1850  Weight: 108.9 kg    Examination:  General exam: Not in respiratory distress, morbidly obese HEENT:PERRL,Oral mucosa moist, Ear/Nose normal on gross exam Respiratory system: Bilateral diminished air sounds, wheezing  cardiovascular system: S1 & S2 heard, RRR. No JVD, murmurs, rubs, gallops or clicks. No pedal edema. Gastrointestinal system: Abdomen is nondistended, soft and nontender. No organomegaly or masses felt. Normal bowel sounds heard. Central nervous system: Alert and oriented. No focal neurological deficits. Extremities: No edema, no clubbing ,no cyanosis Skin: No rashes,  lesions or ulcers,no icterus ,no pallor   Data Reviewed: I have personally reviewed following labs and imaging studies  CBC: Recent Labs  Lab 08/07/20 1943 08/08/20 0550 08/09/20 0537  WBC 9.3 10.8* 21.1*  NEUTROABS 7.4  --   --   HGB 13.1 12.5 13.5  HCT 39.8 37.1 42.2  MCV 92.6 92.5 95.3  PLT 222 204 170   Basic Metabolic Panel: Recent Labs  Lab 08/07/20 1943 08/08/20 0550 08/09/20 0537 08/09/20 1040  NA 134* 134* 136 136  K 3.3* 5.1 5.9* 4.0  CL 92* 97* 100 98  CO2 32 27 27 29   GLUCOSE 175* 249* 165* 226*  BUN 6 11 17 19   CREATININE 0.84 1.21* 1.17* 0.96  CALCIUM 9.0 8.5* 9.4 9.1   GFR: Estimated Creatinine Clearance: 82.6 mL/min (by C-G formula based on SCr of 0.96 mg/dL). Liver Function Tests: Recent Labs  Lab 08/07/20 1943 08/09/20 0537  AST 29 34  ALT 30 35  ALKPHOS 117 97  BILITOT 0.3 1.5*  PROT 7.1 7.5  ALBUMIN 4.0 3.9   Recent Labs  Lab 08/07/20 1943  LIPASE 32   No results for input(s): AMMONIA in the last 168 hours. Coagulation Profile: No results for input(s): INR, PROTIME in the last 168 hours. Cardiac Enzymes: No results for input(s): CKTOTAL, CKMB, CKMBINDEX, TROPONINI in the last 168 hours. BNP (last 3 results) No results for input(s): PROBNP in the last 8760 hours. HbA1C: Recent Labs    08/08/20 0550  HGBA1C 6.9*   CBG: Recent Labs  Lab 08/08/20 2127 08/09/20 0746 08/09/20 1146  GLUCAP 214* 189* 280*   Lipid Profile: No results for input(s): CHOL, HDL, LDLCALC, TRIG, CHOLHDL, LDLDIRECT in the last 72 hours. Thyroid Function Tests: No results for input(s): TSH, T4TOTAL, FREET4, T3FREE, THYROIDAB in the last 72 hours. Anemia Panel: No results for input(s): VITAMINB12, FOLATE, FERRITIN, TIBC, IRON, RETICCTPCT in the last 72 hours. Sepsis Labs: No results for input(s): PROCALCITON, LATICACIDVEN in the last 168 hours.  Recent Results (from the past 240 hour(s))  Resp Panel by RT-PCR (Flu A&B, Covid) Nasopharyngeal Swab      Status: None   Collection Time: 08/07/20  8:10 PM   Specimen: Nasopharyngeal Swab; Nasopharyngeal(NP) swabs in vial transport medium  Result Value Ref Range Status   SARS Coronavirus 2 by RT PCR NEGATIVE NEGATIVE Final    Comment: (NOTE) SARS-CoV-2 target nucleic acids are NOT DETECTED.  The SARS-CoV-2 RNA is generally detectable in upper respiratory specimens during the acute phase of infection. The lowest concentration of SARS-CoV-2 viral copies this assay can detect is 138 copies/mL. A negative result does not preclude SARS-Cov-2 infection and should not be used as the sole basis for treatment or other patient management decisions. A negative result may occur with  improper specimen collection/handling, submission of specimen other than nasopharyngeal swab, presence of viral mutation(s)  within the areas targeted by this assay, and inadequate number of viral copies(<138 copies/mL). A negative result must be combined with clinical observations, patient history, and epidemiological information. The expected result is Negative.  Fact Sheet for Patients:  EntrepreneurPulse.com.au  Fact Sheet for Healthcare Providers:  IncredibleEmployment.be  This test is no t yet approved or cleared by the [place] FDA and  has been authorized for detection and/or diagnosis of SARS-CoV-2 by FDA under an Emergency Use Authorization (EUA). This EUA will remain  in effect (meaning this test can be used) for the duration of the COVID-19 declaration under Section 564(b)(1) of the Act, 21 U.S.C.section 360bbb-3(b)(1), unless the authorization is terminated  or revoked sooner.       Influenza A by PCR NEGATIVE NEGATIVE Final   Influenza B by PCR NEGATIVE NEGATIVE Final    Comment: (NOTE) The Xpert Xpress SARS-CoV-2/FLU/RSV plus assay is intended as an aid in the diagnosis of influenza from Nasopharyngeal swab specimens and should not be used as a sole basis  for treatment. Nasal washings and aspirates are unacceptable for Xpert Xpress SARS-CoV-2/FLU/RSV testing.  Fact Sheet for Patients: EntrepreneurPulse.com.au  Fact Sheet for Healthcare Providers: IncredibleEmployment.be  This test is not yet approved or cleared by the [place] FDA and has been authorized for detection and/or diagnosis of SARS-CoV-2 by FDA under an Emergency Use Authorization (EUA). This EUA will remain in effect (meaning this test can be used) for the duration of the COVID-19 declaration under Section 564(b)(1) of the Act, 21 U.S.C. section 360bbb-3(b)(1), unless the authorization is terminated or revoked.  Performed at [facility], [place] [address] [place], [place] [address]          Radiology Studies: [facility] Chest Port 1 View  Result Date: 08/07/2020 CLINICAL DATA:  Shortness of breath, chest pain EXAM: PORTABLE CHEST 1 VIEW COMPARISON:  05/05/2019 FINDINGS: Heart and mediastinal contours are within normal limits. No focal opacities or effusions. No acute bony abnormality. IMPRESSION: No active disease. Electronically Signed   By: Rolm Baptise M.D.   On: 08/07/2020 20:03        Scheduled Meds:  acyclovir  400 mg Oral BID   atenolol  25 mg Oral Daily   atorvastatin  20 mg Oral Daily   azithromycin  500 mg Oral Daily   buPROPion  150 mg Oral Daily   cyclobenzaprine  10 mg Oral QHS   enoxaparin (LOVENOX) injection  40 mg Subcutaneous Q24H   guaiFENesin  600 mg Oral BID   insulin aspart  0-15 Units Subcutaneous TID WC   ipratropium-albuterol  3 mL Nebulization QID   levothyroxine  25 mcg Oral Q0600   methylPREDNISolone (SOLU-MEDROL) injection  60 mg Intravenous Q12H   montelukast  10 mg Oral QHS   nicotine  14 mg Transdermal Daily   olopatadine  1 drop Both Eyes BID   pantoprazole  40 mg Oral Daily   pregabalin  150 mg Oral QHS   QUEtiapine  50 mg Oral QHS   roflumilast  500 mcg Oral Daily    venlafaxine XR  150 mg Oral Q breakfast   Continuous Infusions:   LOS: 2 days    Time spent:25 mins. More than 50% of that time was spent in counseling and/or coordination of care.      Shelly Coss, MD [facility] P6/15/2022, 12:00 PM

## 2020-08-10 ENCOUNTER — Inpatient Hospital Stay (HOSPITAL_COMMUNITY): Payer: Medicare Other

## 2020-08-10 ENCOUNTER — Ambulatory Visit (INDEPENDENT_AMBULATORY_CARE_PROVIDER_SITE_OTHER): Payer: Medicare Other | Admitting: *Deleted

## 2020-08-10 DIAGNOSIS — F320 Major depressive disorder, single episode, mild: Secondary | ICD-10-CM | POA: Diagnosis not present

## 2020-08-10 DIAGNOSIS — F419 Anxiety disorder, unspecified: Secondary | ICD-10-CM

## 2020-08-10 DIAGNOSIS — F32A Depression, unspecified: Secondary | ICD-10-CM

## 2020-08-10 DIAGNOSIS — J9621 Acute and chronic respiratory failure with hypoxia: Secondary | ICD-10-CM

## 2020-08-10 DIAGNOSIS — J9622 Acute and chronic respiratory failure with hypercapnia: Secondary | ICD-10-CM | POA: Diagnosis not present

## 2020-08-10 DIAGNOSIS — J449 Chronic obstructive pulmonary disease, unspecified: Secondary | ICD-10-CM

## 2020-08-10 DIAGNOSIS — J441 Chronic obstructive pulmonary disease with (acute) exacerbation: Secondary | ICD-10-CM | POA: Diagnosis not present

## 2020-08-10 LAB — HEMOGLOBIN A1C
Hgb A1c MFr Bld: 6.8 % — ABNORMAL HIGH (ref 4.8–5.6)
Mean Plasma Glucose: 148 mg/dL

## 2020-08-10 LAB — CBC WITH DIFFERENTIAL/PLATELET
Abs Immature Granulocytes: 0.2 10*3/uL — ABNORMAL HIGH (ref 0.00–0.07)
Basophils Absolute: 0 10*3/uL (ref 0.0–0.1)
Basophils Relative: 0 %
Eosinophils Absolute: 0 10*3/uL (ref 0.0–0.5)
Eosinophils Relative: 0 %
HCT: 35.8 % — ABNORMAL LOW (ref 36.0–46.0)
Hemoglobin: 11.6 g/dL — ABNORMAL LOW (ref 12.0–15.0)
Immature Granulocytes: 2 %
Lymphocytes Relative: 9 %
Lymphs Abs: 1.2 10*3/uL (ref 0.7–4.0)
MCH: 30.4 pg (ref 26.0–34.0)
MCHC: 32.4 g/dL (ref 30.0–36.0)
MCV: 93.7 fL (ref 80.0–100.0)
Monocytes Absolute: 0.4 10*3/uL (ref 0.1–1.0)
Monocytes Relative: 3 %
Neutro Abs: 11.5 10*3/uL — ABNORMAL HIGH (ref 1.7–7.7)
Neutrophils Relative %: 86 %
Platelets: 245 10*3/uL (ref 150–400)
RBC: 3.82 MIL/uL — ABNORMAL LOW (ref 3.87–5.11)
RDW: 14.8 % (ref 11.5–15.5)
WBC: 13.2 10*3/uL — ABNORMAL HIGH (ref 4.0–10.5)
nRBC: 0 % (ref 0.0–0.2)

## 2020-08-10 LAB — BASIC METABOLIC PANEL
Anion gap: 13 (ref 5–15)
BUN: 21 mg/dL — ABNORMAL HIGH (ref 6–20)
CO2: 32 mmol/L (ref 22–32)
Calcium: 9.7 mg/dL (ref 8.9–10.3)
Chloride: 89 mmol/L — ABNORMAL LOW (ref 98–111)
Creatinine, Ser: 0.96 mg/dL (ref 0.44–1.00)
GFR, Estimated: >60 mL/min (ref >60)
Glucose, Bld: 214 mg/dL — ABNORMAL HIGH (ref 70–99)
Potassium: 4.4 mmol/L (ref 3.5–5.1)
Sodium: 134 mmol/L — ABNORMAL LOW (ref 135–145)

## 2020-08-10 LAB — BLOOD GAS, VENOUS
Acid-Base Excess: 7.4 mmol/L — ABNORMAL HIGH (ref 0.0–2.0)
Bicarbonate: 31.3 mmol/L — ABNORMAL HIGH (ref 20.0–28.0)
O2 Saturation: 99.1 %
Patient temperature: 98.6
pCO2, Ven: 42.3 mmHg — ABNORMAL LOW (ref 44.0–60.0)
pH, Ven: 7.482 — ABNORMAL HIGH (ref 7.250–7.430)
pO2, Ven: 131 mmHg — ABNORMAL HIGH (ref 32.0–45.0)

## 2020-08-10 LAB — PROCALCITONIN: Procalcitonin: <0.1 ng/mL

## 2020-08-10 LAB — LACTIC ACID, PLASMA: Lactic Acid, Venous: 2.2 mmol/L (ref 0.5–1.9)

## 2020-08-10 LAB — GLUCOSE, CAPILLARY
Glucose-Capillary: 316 mg/dL — ABNORMAL HIGH (ref 70–99)
Glucose-Capillary: 177 mg/dL — ABNORMAL HIGH (ref 70–99)
Glucose-Capillary: 165 mg/dL — ABNORMAL HIGH (ref 70–99)
Glucose-Capillary: 250 mg/dL — ABNORMAL HIGH (ref 70–99)

## 2020-08-10 LAB — TROPONIN I (HIGH SENSITIVITY): Troponin I (High Sensitivity): 3 ng/L (ref <18)

## 2020-08-10 MED ORDER — HYDROCHLOROTHIAZIDE 25 MG PO TABS
25.0000 mg | ORAL_TABLET | Freq: Every day | ORAL | Status: DC
  Administered 2020-08-10: 25 mg via ORAL
  Filled 2020-08-10: qty 1

## 2020-08-10 MED ORDER — CLOTRIMAZOLE 1 % VA CREA
1.0000 | TOPICAL_CREAM | Freq: Every day | VAGINAL | Status: DC
  Administered 2020-08-10 – 2020-08-13 (×4): 1 via VAGINAL
  Filled 2020-08-10 (×2): qty 45

## 2020-08-10 MED ORDER — HYDRALAZINE HCL 10 MG PO TABS
10.0000 mg | ORAL_TABLET | Freq: Three times a day (TID) | ORAL | Status: DC
  Administered 2020-08-10 – 2020-08-12 (×5): 10 mg via ORAL
  Filled 2020-08-10 (×6): qty 1

## 2020-08-10 MED ORDER — METHYLPREDNISOLONE SODIUM SUCC 125 MG IJ SOLR
60.0000 mg | Freq: Four times a day (QID) | INTRAMUSCULAR | Status: DC
  Administered 2020-08-10 – 2020-08-13 (×11): 60 mg via INTRAVENOUS
  Filled 2020-08-10 (×12): qty 2

## 2020-08-10 MED ORDER — FUROSEMIDE 10 MG/ML IJ SOLN
40.0000 mg | Freq: Once | INTRAMUSCULAR | Status: AC
  Administered 2020-08-10: 40 mg via INTRAVENOUS
  Filled 2020-08-10: qty 4

## 2020-08-10 MED ORDER — METRONIDAZOLE 500 MG PO TABS
500.0000 mg | ORAL_TABLET | Freq: Two times a day (BID) | ORAL | Status: AC
  Administered 2020-08-10 – 2020-08-16 (×14): 500 mg via ORAL
  Filled 2020-08-10 (×14): qty 1

## 2020-08-10 MED ORDER — MAGNESIUM SULFATE 2 GM/50ML IV SOLN
2.0000 g | Freq: Once | INTRAVENOUS | Status: AC
  Administered 2020-08-10: 2 g via INTRAVENOUS
  Filled 2020-08-10: qty 50

## 2020-08-10 MED ORDER — ALBUTEROL SULFATE (2.5 MG/3ML) 0.083% IN NEBU: 2.5000 mg | INHALATION_SOLUTION | RESPIRATORY_TRACT | Status: DC | PRN

## 2020-08-10 MED ORDER — FUROSEMIDE 20 MG PO TABS
20.0000 mg | ORAL_TABLET | Freq: Every day | ORAL | Status: DC
  Administered 2020-08-11 – 2020-08-19 (×9): 20 mg via ORAL
  Filled 2020-08-10 (×9): qty 1

## 2020-08-10 MED ORDER — IPRATROPIUM-ALBUTEROL 0.5-2.5 (3) MG/3ML IN SOLN
3.0000 mL | RESPIRATORY_TRACT | Status: DC
  Administered 2020-08-10 – 2020-08-11 (×4): 3 mL via RESPIRATORY_TRACT
  Filled 2020-08-10 (×4): qty 3

## 2020-08-10 MED ORDER — SODIUM CHLORIDE 0.9 % IV BOLUS
500.0000 mL | Freq: Once | INTRAVENOUS | Status: AC
  Administered 2020-08-11: 500 mL via INTRAVENOUS

## 2020-08-10 MED ORDER — FUROSEMIDE 10 MG/ML IJ SOLN
20.0000 mg | Freq: Once | INTRAMUSCULAR | Status: AC
  Administered 2020-08-10: 20 mg via INTRAVENOUS
  Filled 2020-08-10: qty 2

## 2020-08-10 MED ORDER — FUROSEMIDE 10 MG/ML IJ SOLN
INTRAMUSCULAR | Status: AC
  Filled 2020-08-10: qty 2

## 2020-08-10 MED ORDER — GUAIFENESIN ER 600 MG PO TB12
1200.0000 mg | ORAL_TABLET | Freq: Two times a day (BID) | ORAL | Status: DC
  Administered 2020-08-10 – 2020-08-15 (×9): 1200 mg via ORAL
  Filled 2020-08-10 (×12): qty 2

## 2020-08-10 NOTE — Care Management Important Message (Signed)
Important Message  Patient Details IM Letter given to the Patient. Name: Joan [name] MRN: [id number] Date of Birth: September 24, 1960   Medicare Important Message Given:  Yes     Kerin Salen 08/10/2020, 10:03 AM

## 2020-08-10 NOTE — Progress Notes (Signed)
Pt refuses Blood Gas.  CCM aware

## 2020-08-10 NOTE — Progress Notes (Signed)
Inpatient Diabetes Program Recommendations  AACE/ADA: New Consensus Statement on Inpatient Glycemic Control (2015)  Target Ranges:  Prepandial:   less than 140 mg/dL      Peak postprandial:   less than 180 mg/dL (1-2 hours)      Critically ill patients:  140 - 180 mg/dL   Lab Results  Component Value Date   GLUCAP 316 (H) 08/10/2020   HGBA1C 6.8 (H) 08/09/2020    Review of Glycemic Control Results for Joan [name], Joan [name] (MRN [id number]) as of 08/10/2020 09:55  Ref. Range 08/09/2020 07:46 08/09/2020 11:46 08/09/2020 16:27 08/09/2020 21:33 08/10/2020 07:11  Glucose-Capillary Latest Ref Range: 70 - 99 mg/dL 189 (H) 280 (H) 280 (H) 165 (H) 316 (H)   Diabetes history: DM 2 diagnosed 10/2019 Outpatient Diabetes medications: none Current orders for Inpatient glycemic control:  Novolog 0-15 units tid Lantus 10 units Novolog 4 units tid meal coverage  Solumedrol 60 mg Q12 Current A1c 6.9% on 6/14  Inpatient Diabetes Program Recommendations:    Fasting glucose >300 this am.  -  Consider increasing Lantus to 20 units while on steroids   Thanks, Tama Headings RN, MSN, BC-ADM Inpatient Diabetes Coordinator Team Pager [phone] (8a-5p)

## 2020-08-10 NOTE — Progress Notes (Signed)
   08/10/20 2052  Assess: MEWS Score  Resp (!) 27  Level of Consciousness Alert  Patient Activity (if Appropriate) In bed  O2 Flow Rate (L/min) 3 L/min  Assess: MEWS Score  MEWS Temp 0  MEWS Systolic 0  MEWS Pulse 0  MEWS RR 2  MEWS LOC 0  MEWS Score 2  MEWS Score Color Yellow  Assess: if the MEWS score is Yellow or Red  Were vital signs taken at a resting state? Yes  Focused Assessment No change from prior assessment  Does the patient meet 2 or more of the SIRS criteria? No  MEWS guidelines implemented *See Row Information* Yes  Treat  MEWS Interventions Other (Comment) (Assessed patient)  Take Vital Signs  Increase Vital Sign Frequency  Yellow: Q 2hr X 2 then Q 4hr X 2, if remains yellow, continue Q 4hrs  Escalate  MEWS: Escalate Yellow: discuss with charge nurse/RN and consider discussing with provider and RRT  Notify: Charge Nurse/RN  Name of Charge Nurse/RN Notified Renee RN  Date Charge Nurse/RN Notified 08/10/20  Time Charge Nurse/RN Notified 2052  Document  Patient Outcome Stabilized after interventions  Progress note created (see row info) Yes  Assess: SIRS CRITERIA  SIRS Temperature  0  SIRS Pulse 0  SIRS Respirations  1  SIRS WBC 0  SIRS Score Sum  1   Patient received breathing treatment for COPD exacerbation and respirations went up to 27. Patient alert and oriented x 4. No change from previous shift assessment. Charge nurse was notified. Yellow MEWS implemented.

## 2020-08-10 NOTE — Consult Note (Signed)
NAME:  Joan [name], MRN:  [id number], DOB:  06/01/1960, LOS: 3 ADMISSION DATE:  08/07/2020, CONSULTATION DATE:  08/10/20 REFERRING MD:  DR Tyrell Antonio, CHIEF COMPLAINT:  Acute COPD exacerbation -slow to resolve   History of Present Illness:   60 year old female with chronic hypercapnia as of 2018 but uses CPAP for sleep apnea, polypharmacy, Gold stage III bordering on Gold stage IV COPD [see PFTs below], polypharmacy, anxiety and multiple medical problems including PTSD, bipolar disorder [refuses ABG].  In 2018 noted to have diastolic dysfunction presented with classic symptoms of COPD exacerbation.  Has been on home atenolol, 4 times daily bronchodilator and Solu-Medrol 60 mg twice daily.  However she has not improved had acute episode of dyspnea couple of times today including at the time of CCM evaluation around 5 PM/5:30 PM 08/10/2020.  She is received 1 dose of Lasix since admission today.  Noted to have some pedal edema.  CCM called because of respiratory distress.  She got a dose of nebulizer and seemed to improve.  She was refusing blood gas.  Her COVID PCR and flu PCR negative but does not have multiplex respiratory virus panel PCR  Pertinent  Medical History  Results for HARNEET, NOBLETT (MRN [id number]) as of 08/10/2020 18:25  Ref. Range 09/24/2016 15:48  pH, Ven Latest Ref Range: 7.250 - 7.430  7.428  pCO2, Ven Latest Ref Range: 44.0 - 60.0 mmHg 52.7  pO2, Ven Latest Ref Range: 32.0 - 45.0 mmHg 46.0 (H)   PFT Results Latest Ref Rng & Units 03/17/2019 10/30/2016  FVC-Pre L 2.35 2.16  FVC-Predicted Pre % 64 58  FVC-Post L 2.66 2.53  FVC-Predicted Post % 73 68  Pre FEV1/FVC % % 45 49  Post FEV1/FCV % % 47 49  FEV1-Pre L 1.06 1.05  FEV1-Predicted Pre % 37 36  FEV1-Post L 1.24 1.23  DLCO uncorrected ml/min/mmHg 9.80 9.40  DLCO UNC% % 44 34  DLCO corrected ml/min/mmHg - 9.52  DLCO COR %Predicted % - 35  DLVA Predicted % 54 42  TLC L 5.72 5.36  TLC % Predicted % 106 100  RV %  Predicted % 146 147      has a past medical history of Acute respiratory failure with hypoxia ([place]), Alcoholism and drug addiction in family, Anxiety, Appendicitis, Arthritis, Asthma, Bipolar disorder (HCC), Chronic bronchitis (HCC), Chronic leg pain, Chronic lower back pain, COPD (chronic obstructive pulmonary disease) ([place]), Depression, Diabetes mellitus without complication ([place]), Diabetic neuropathy ([place]), Early cataracts, bilateral, Emphysema of lung ([place]), Generalized anxiety disorder (02/09/2020), GERD (gastroesophageal reflux disease), Headache, Hepatitis C, High cholesterol, Hypertension, Hypothyroidism, Incarcerated ventral hernia (04/04/2017), Mood disorder in conditions classified elsewhere (02/09/2020), On home oxygen therapy, OSA on CPAP, Paranoid ([place]), Pneumonia, Positive PPD, PTSD (post-traumatic stress disorder) (02/09/2020), Tachycardia, Thyroid disease, Wears glasses, and Wears partial dentures.   reports that she has been smoking cigarettes. She has a 22.00 pack-year smoking history. She has never used smokeless tobacco.  Past Surgical History:  Procedure Laterality Date   BACK SURGERY     BIOPSY  11/24/2018   Procedure: BIOPSY;  Surgeon: Thornton Park, MD;  Location: [facility];  Service: Gastroenterology;;   CARDIAC CATHETERIZATION     COLONOSCOPY     COLONOSCOPY WITH PROPOFOL N/A 11/24/2018   Procedure: COLONOSCOPY WITH PROPOFOL;  Surgeon: Thornton Park, MD;  Location: [facility];  Service: Gastroenterology;  Laterality: N/A;   CYST EXCISION     removal of cyst in sinuses   DILATION AND CURETTAGE OF UTERUS  ESOPHAGOGASTRODUODENOSCOPY (EGD) WITH PROPOFOL N/A 11/24/2018   Procedure: ESOPHAGOGASTRODUODENOSCOPY (EGD) WITH PROPOFOL;  Surgeon: Thornton Park, MD;  Location: [facility];  Service: Gastroenterology;  Laterality: N/A;   FOOT SURGERY Bilateral    bone removed from 4th and 5 th toes and put in pin then took pin out   [place]  ~ 2014/2015   "removed mesh & infection"   [place] N/A 04/04/2017   Procedure: LAPAROSCOPIC INCISIONAL HERNIA REPAIR WITH MESH;  Surgeon: Fanny Skates, MD;  Location: [facility];  Service: General;  Laterality: N/A;   LACERATION REPAIR Right    repair wrist artery from laceration from ice skate   LAPAROSCOPIC APPENDECTOMY N/A 03/31/2019   Procedure: APPENDECTOMY LAPAROSCOPIC;  Surgeon: Ralene Ok, MD;  Location: [facility];  Service: General;  Laterality: N/A;   LAPAROSCOPIC APPENDECTOMY N/A 07/09/2019   Procedure: APPENDECTOMY LAPAROSCOPIC;  Surgeon: Ralene Ok, MD;  Location: [facility];  Service: General;  Laterality: N/A;   LAPAROSCOPIC INCISIONAL / UMBILICAL / [place]  04/04/2017   LAPAROSCOPIC INCISIONAL HERNIA REPAIR WITH MESH   LIPOMA EXCISION Right    fattty lipoma in neck   NASAL SEPTUM SURGERY     POSTERIOR LUMBAR FUSION  2015/2016 X 2   "added rods and screws"   SINOSCOPY     TOTAL HIP ARTHROPLASTY Right 02/05/2017   Procedure: RIGHT TOTAL HIP ARTHROPLASTY;  Surgeon: [name], MD;  Location: [facility];  Service: Orthopedics;  Laterality: Right;   TOTAL HIP ARTHROPLASTY Left 06/18/2017   Procedure: LEFT TOTAL HIP ARTHROPLASTY;  Surgeon: [name], MD;  Location[facility];  Service: Orthopedics;  Laterality: Left;   TOTAL HIP REVISION Left 01/26/2019   Procedure: TOTAL HIP REVISION;  Surgeon: Paralee Cancel, MD;  Location: [facility];  Service: Orthopedics;  Laterality: Left;  2 hrs   TUBAL LIGATION     UMBILICAL HERNIA REPAIR  ~ 2013   w/mesh   UPPER GI ENDOSCOPY      Allergies  Allergen Reactions   Keflex [Cephalexin] Rash and Other (See Comments)    Has tolerated amoxicillin since had keflex   Prednisone Other (See Comments)    "counter reacts"   Shellfish Allergy Shortness Of Breath, Nausea And Vomiting and Other (See Comments)    Stomach cramps   Tetracyclines & Related Anaphylaxis, Swelling and Other (See Comments)    Throat  swelling requiring hospitalization   Dilaudid [Hydromorphone Hcl] Other (See Comments)    "Lethargy"   Incruse Ellipta [Umeclidinium Bromide] Nausea Only   Other    Tuberculin Tests Other (See Comments)    False postive    Immunization History  Administered Date(s) Administered   Influenza,inj,Quad PF,6+ Mos 11/25/2016, 11/11/2017, 11/09/2018, 10/28/2019   PFIZER(Purple Top)SARS-COV-2 Vaccination 06/04/2019, 06/28/2019   Tdap 12/17/2018    Family History  Problem Relation Age of Onset   Diabetes Mother    Hypertension Mother    Hypertension Father    Cancer Father        lung     Current Facility-Administered Medications:    acetaminophen (TYLENOL) tablet 650 mg, 650 mg, Oral, Q6H PRN, 650 mg at 08/09/20 2329 **OR** acetaminophen (TYLENOL) suppository 650 mg, 650 mg, Rectal, Q6H PRN, Mansy, Jan A, MD   acyclovir (ZOVIRAX) tablet 400 mg, 400 mg, Oral, BID, Mansy, Jan A, MD, 400 mg at 08/10/20 1025   albuterol (PROVENTIL) (2.5 MG/3ML) 0.083% nebulizer solution 2.5 mg, 2.5 mg, Nebulization, Q2H PRN, Regalado, Belkys A,  MD   ALPRAZolam Duanne Moron) tablet 0.5 mg, 0.5 mg, Oral, TID PRN, Shelly Coss, MD, 0.5 mg at 08/10/20 0806   atorvastatin (LIPITOR) tablet 20 mg, 20 mg, Oral, Daily, Mansy, Jan A, MD, 20 mg at 08/10/20 1025   [COMPLETED] azithromycin (ZITHROMAX) 500 mg in sodium chloride 0.9 % 250 mL IVPB, 500 mg, Intravenous, Q24H, Stopped at 08/08/20 0158 **FOLLOWED BY** azithromycin (ZITHROMAX) tablet 500 mg, 500 mg, Oral, Daily, Mansy, Jan A, MD, 500 mg at 08/10/20 1025   budesonide (PULMICORT) nebulizer solution 0.5 mg, 0.5 mg, Nebulization, BID, Adhikari, Amrit, MD, 0.5 mg at 08/10/20 1737   buPROPion (WELLBUTRIN XL) 24 hr tablet 150 mg, 150 mg, Oral, Daily, Mansy, Jan A, MD, 150 mg at 08/10/20 1025   Camphor-Menthol-Methyl Sal 3.03-02-08 % PTCH 1 patch, 1 patch, Topical, Daily PRN, Mansy, Jan A, MD   chlorpheniramine-HYDROcodone (TUSSIONEX) 10-8 MG/5ML suspension 5 mL, 5 mL, Oral,  Q12H PRN, Mansy, Jan A, MD, 5 mL at 08/08/20 1337   clotrimazole (GYNE-LOTRIMIN) vaginal cream 1 Applicatorful, 1 Applicatorful, Vaginal, QHS, Regalado, Belkys A, MD   cyclobenzaprine (FLEXERIL) tablet 10 mg, 10 mg, Oral, QHS, Mansy, Jan A, MD, 10 mg at 08/09/20 2223   enoxaparin (LOVENOX) injection 40 mg, 40 mg, Subcutaneous, Q24H, Mansy, Jan A, MD, 40 mg at 08/10/20 1024   furosemide (LASIX) injection 40 mg, 40 mg, Intravenous, Once, Regalado, Belkys A, MD   [START ON 08/11/2020] furosemide (LASIX) tablet 20 mg, 20 mg, Oral, Daily, Regalado, Belkys A, MD   guaiFENesin (MUCINEX) 12 hr tablet 1,200 mg, 1,200 mg, Oral, BID, Regalado, Belkys A, MD   hydrALAZINE (APRESOLINE) tablet 10 mg, 10 mg, Oral, Q8H, Regalado, Belkys A, MD   insulin aspart (novoLOG) injection 0-15 Units, 0-15 Units, Subcutaneous, TID WC, Georgette Shell, MD, 3 Units at 08/10/20 1732   insulin aspart (novoLOG) injection 4 Units, 4 Units, Subcutaneous, TID WC, Adhikari, Amrit, MD, 4 Units at 08/10/20 1731   insulin glargine (LANTUS) injection 10 Units, 10 Units, Subcutaneous, Daily, Adhikari, Amrit, MD, 10 Units at 08/10/20 1024   ipratropium-albuterol (DUONEB) 0.5-2.5 (3) MG/3ML nebulizer solution 3 mL, 3 mL, Nebulization, Q4H, Regalado, Belkys A, MD   levothyroxine (SYNTHROID) tablet 25 mcg, 25 mcg, Oral, Q0600, Mansy, Jan A, MD, 25 mcg at 08/10/20 0607   magnesium hydroxide (MILK OF MAGNESIA) suspension 30 mL, 30 mL, Oral, Daily PRN, Mansy, Jan A, MD   magnesium sulfate IVPB 2 g 50 mL, 2 g, Intravenous, Once, Regalado, Belkys A, MD   methylPREDNISolone sodium succinate (SOLU-MEDROL) 125 mg/2 mL injection 60 mg, 60 mg, Intravenous, Q6H, Regalado, Belkys A, MD   metroNIDAZOLE (FLAGYL) tablet 500 mg, 500 mg, Oral, Q12H, Regalado, Belkys A, MD, 500 mg at 08/10/20 1319   montelukast (SINGULAIR) tablet 10 mg, 10 mg, Oral, QHS, Mansy, Jan A, MD, 10 mg at 08/09/20 2223   nicotine (NICODERM CQ - dosed in mg/24 hours) patch 14 mg, 14  mg, Transdermal, Daily, Georgette Shell, MD   olopatadine (PATANOL) 0.1 % ophthalmic solution 1 drop, 1 drop, Both Eyes, BID, Mansy, Jan A, MD, 1 drop at 08/10/20 1024   ondansetron (ZOFRAN) tablet 4 mg, 4 mg, Oral, Q6H PRN **OR** ondansetron (ZOFRAN) injection 4 mg, 4 mg, Intravenous, Q6H PRN, Mansy, Jan A, MD   pantoprazole (PROTONIX) EC tablet 40 mg, 40 mg, Oral, Daily, Mansy, Jan A, MD, 40 mg at 08/10/20 1025   polyethylene glycol (MIRALAX / GLYCOLAX) packet 17 g, 17 g, Oral, Daily PRN, Mansy, Jan A,  MD   pregabalin (LYRICA) capsule 150 mg, 150 mg, Oral, QHS, Mansy, Jan A, MD, 150 mg at 08/09/20 2222   QUEtiapine (SEROQUEL) tablet 50 mg, 50 mg, Oral, QHS, Mansy, Jan A, MD, 50 mg at 08/09/20 2223   venlafaxine XR (EFFEXOR-XR) 24 hr capsule 150 mg, 150 mg, Oral, Q breakfast, Mansy, Jan A, MD, 150 mg at 08/10/20 0806   zolpidem (AMBIEN) tablet 5 mg, 5 mg, Oral, QHS PRN, Shelly Coss, MD, 5 mg at 08/09/20 [facility] Events: Including procedures, antibiotic start and stop dates in addition to other pertinent events     Interim History / Subjective:  6/16- seen in bed 1512  Objective   Blood pressure (!) 176/99, pulse 75, temperature 98.4 F (36.9 C), temperature source Oral, resp. rate 18, height 5\' 8"  (1.727 m), weight 108.9 kg, SpO2 95 %.        Intake/Output Summary (Last 24 hours) at 08/10/2020 1833 Last data filed at 08/10/2020 1829 Gross per 24 hour  Intake 2360 ml  Output --  Net 2360 ml   Filed Weights   08/07/20 1850  Weight: 108.9 kg    Examination: General: obese HENT: No elevated JVP. No neck nodes Lungs: Bilateral deep end expiratory wheeze Cardiovascular: Tachycardia. Regular. Normal heart sounds Abdomen: obese. None tender Extremities: intact. ++ eema Neuro: alerty and oreitned . Doing nebs. Appears anxious GU: not examined  Labs/imaging that I havepersonally reviewed  (right click and "Reselect all SmartList Selections" daily)      LABS    PULMONARY No results for input(s): PHART, PCO2ART, PO2ART, HCO3, TCO2, O2SAT in the last 168 hours.  Invalid input(s): PCO2, PO2  CBC Recent Labs  Lab 08/08/20 0550 08/09/20 0537 08/10/20 0508  HGB 12.5 13.5 11.6*  HCT 37.1 42.2 35.8*  WBC 10.8* 21.1* 13.2*  PLT 204 244 245    COAGULATION No results for input(s): INR in the last 168 hours.  CARDIAC  No results for input(s): TROPONINI in the last 168 hours. No results for input(s): PROBNP in the last 168 hours.   CHEMISTRY Recent Labs  Lab 08/07/20 1943 08/08/20 0550 08/09/20 0537 08/09/20 1040  NA 134* 134* 136 136  K 3.3* 5.1 5.9* 4.0  CL 92* 97* 100 98  CO2 32 27 27 29   GLUCOSE 175* 249* 165* 226*  BUN 6 11 17 19   CREATININE 0.84 1.21* 1.17* 0.96  CALCIUM 9.0 8.5* 9.4 9.1   Estimated Creatinine Clearance: 82.6 mL/min (by C-G formula based on SCr of 0.96 mg/dL).   LIVER Recent Labs  Lab 08/07/20 1943 08/09/20 0537  AST 29 34  ALT 30 35  ALKPHOS 117 97  BILITOT 0.3 1.5*  PROT 7.1 7.5  ALBUMIN 4.0 3.9     INFECTIOUS No results for input(s): LATICACIDVEN, PROCALCITON in the last 168 hours.   ENDOCRINE CBG (last 3)  Recent Labs    08/10/20 0711 08/10/20 1151 08/10/20 1629  GLUCAP 316* 177* 165*         IMAGING x48h  - image(s) personally visualized  -   highlighted in bold DG CHEST PORT 1 VIEW  Result Date: 08/10/2020 CLINICAL DATA:  Respiratory distress. EXAM: PORTABLE CHEST 1 VIEW COMPARISON:  08/07/2020 FINDINGS: 1801 hours. Subtle airspace disease noted right base new since prior. Fat pad noted left base as seen on CT chest 03/08/2020. The cardiopericardial silhouette is within normal limits for size. The visualized bony structures of the thorax show no acute abnormality. Telemetry leads overlie the  chest. IMPRESSION: New subtle opacity at the right base compatible with atelectasis or pneumonia. Electronically Signed   By: Misty Stanley M.D.   On: 08/10/2020 18:18      Resolved Hospital Problem list   x  Assessment & Plan:  Baseline   - Gold stage 3-4 copd with chronic hypoxemic/hypercapnic failure; 3L [place] - Obesity -  Anxiety   Acute on Chronic Hypoxemic Respiratory failure du e to AECOPD +/- acute on chronic diast CHF  - Failure ti o improve -> ? Viral and CHF Nos v anxiety v hypercapnia v ongoing betablocker v further room to bronchodilate v   Plan  - Start BiPAP QHS - check VBG (refused ABG)  - dc ateeolol - increased bronnchodilatation frequency  - increase steroids  - lasix - get echo  - get RVP - move to ICU if worse -   Best practice (right click and "Reselect all SmartList Selections" daily)  Per triald     ATTESTATION & SIGNATURE   The patient Ayline Dingus is critically ill with multiple organ systems failure and requires high complexity decision making for assessment and support, frequent evaluation and titration of therapies, application of advanced monitoring technologies and extensive interpretation of multiple databases.   Critical Care Time devoted to patient care services described in this note is  45  Minutes. This time reflects time of care of this signee Dr Brand Males. This critical care time does not reflect procedure time, or teaching time or supervisory time of PA/NP/Med student/Med Resident etc but could involve care discussion time     Dr. Brand Males, M.D., [facility].C.P Pulmonary and Critical Care Medicine Staff Physician [facility] Pager: [phone], If no answer or between  15:00h - 7:00h: call [phone]  08/10/2020 7:00 PM

## 2020-08-10 NOTE — Progress Notes (Addendum)
PROGRESS NOTE    Joan [name]  GGY:[id number] DOB: [birth year] DOA: 08/07/2020 PCP: [name], MD   Brief Narrative:  60 year old with past medical history significant for COPD, bipolar disorder, PTSD, chronic respiratory failure on home oxygen 3 L, who presented to the ED complaining of worsening shortness of breath associated with cough, wheezing.  She is a current smoker 1 pack/day. Chest x-ray on presentation did not show any acute pulmonary findings.  Patient was admitted for management of COPD exacerbation.   Assessment & Plan:   Active Problems:   COPD exacerbation ([place])  1-Acute on Chronic Hypoxic Respiratory Failure: Secondary to COPD exacerbation -Currently on 3 L home oxygen -Patient continues to have shortness of breath, bilateral wheezing. -Continue with IV steroids, bronchodilators, incentive spirometry. -Continue with azithromycin. -Increased dose of guaifenesin. -She report history of valve problem, report LED. Will give one time dose of lasix. Check ECHO.  -Will give IV magnesium.   Addendum;  Came to check on patient this afternoon. She is more tachypnea, speaking short sentence.  -Pulmonary Consulted.  -Plan for Chest x ray, ABG if hypercapnia might need BIPAP.  -Nebulizer change to $ 4 hours. Added albuterol Q 2 hours.  -Steroids change to TID.  -IV lasix.   2-Tobacco use: Counseling provided.  3-Bacteria vaginosis: Will start oral flagyl for 7 days.  Will also start Clotrimazole to cover for Candida infection.   4-Hypothyroidism: Continue with Synthroid  History of depression/bipolar/PTSD: Continue with Wellbutrin and Effexor  GERD: Continue with PPI.  Hyperlipidemia; on statins.   Leukocytosis: Secondary to a steroid.  Steroid-induced hyperglycemia; SSI. On lantus.  HB A1c 6.8  AKI resolved. Sleep Apnea; CPAP/  Morbid obesity. BMI 36.4 HTN; on HCTZ, change to lasix staring tomorrow to help with LE edema.  BP elevated  will start low dose hydralazine.   Estimated body mass index is 36.49 kg/m as calculated from the following:   Height as of this encounter: 5\' 8"  (1.727 m).   Weight as of this encounter: 108.9 kg.   DVT prophylaxis: Lovenox Code Status: Full code Family Communication: care discussed with patient.  Disposition Plan:  Status is: Inpatient  Remains inpatient appropriate because:IV treatments appropriate due to intensity of illness or inability to take PO  Dispo: The patient is from: Home              Anticipated d/c is to: Home              Patient currently is not medically stable to d/c.   Difficult to place patient No        Consultants:  none  Procedures:  ECHO  Antimicrobials:    Subjective: She is feeling SOB. She report cough.  She is determine to stop smoking.  She report vaginal odor, and yellowish, brown discharge. White discharge She has history of valve problem.,  She has chronic pain, below her breast, on and off. Her Doctors dont know cause.   Objective: Vitals:   08/09/20 2136 08/10/20 0607 08/10/20 0719 08/10/20 1331  BP:  (!) 172/99  (!) 176/99  Pulse:  66  75  Resp: (!) 21 18    Temp:  (!) 97.3 F (36.3 C)    TempSrc:  Oral    SpO2:  100% 100% 100%  Weight:      Height:        Intake/Output Summary (Last 24 hours) at 08/10/2020 1658 Last data filed at 08/09/2020 1917 Gross per 24 hour  Intake  236 ml  Output --  Net 236 ml   Filed Weights   08/07/20 1850  Weight: 108.9 kg    Examination:  General exam: Appears calm and comfortable  Respiratory system: BL wheezing,  Cardiovascular system: S1 & S2 heard, RRR.  Gastrointestinal system: Abdomen is nondistended, soft and nontender. No organomegaly or masses felt. Normal bowel sounds heard. Central nervous system: Alert and oriented.  Extremities: LE edema Data Reviewed: I have personally reviewed following labs and imaging studies  CBC: Recent Labs  Lab 08/07/20 1943  08/08/20 0550 08/09/20 0537 08/10/20 0508  WBC 9.3 10.8* 21.1* 13.2*  NEUTROABS 7.4  --   --  11.5*  HGB 13.1 12.5 13.5 11.6*  HCT 39.8 37.1 42.2 35.8*  MCV 92.6 92.5 95.3 93.7  PLT 222 204 244 993   Basic Metabolic Panel: Recent Labs  Lab 08/07/20 1943 08/08/20 0550 08/09/20 0537 08/09/20 1040  NA 134* 134* 136 136  K 3.3* 5.1 5.9* 4.0  CL 92* 97* 100 98  CO2 32 27 27 29   GLUCOSE 175* 249* 165* 226*  BUN 6 11 17 19   CREATININE 0.84 1.21* 1.17* 0.96  CALCIUM 9.0 8.5* 9.4 9.1   GFR: Estimated Creatinine Clearance: 82.6 mL/min (by C-G formula based on SCr of 0.96 mg/dL). Liver Function Tests: Recent Labs  Lab 08/07/20 1943 08/09/20 0537  AST 29 34  ALT 30 35  ALKPHOS 117 97  BILITOT 0.3 1.5*  PROT 7.1 7.5  ALBUMIN 4.0 3.9   Recent Labs  Lab 08/07/20 1943  LIPASE 32   No results for input(s): AMMONIA in the last 168 hours. Coagulation Profile: No results for input(s): INR, PROTIME in the last 168 hours. Cardiac Enzymes: No results for input(s): CKTOTAL, CKMB, CKMBINDEX, TROPONINI in the last 168 hours. BNP (last 3 results) No results for input(s): PROBNP in the last 8760 hours. HbA1C: Recent Labs    08/08/20 0550 08/09/20 0537  HGBA1C 6.9* 6.8*   CBG: Recent Labs  Lab 08/09/20 1627 08/09/20 2133 08/10/20 0711 08/10/20 1151 08/10/20 1629  GLUCAP 280* 165* 316* 177* 165*   Lipid Profile: No results for input(s): CHOL, HDL, LDLCALC, TRIG, CHOLHDL, LDLDIRECT in the last 72 hours. Thyroid Function Tests: No results for input(s): TSH, T4TOTAL, FREET4, T3FREE, THYROIDAB in the last 72 hours. Anemia Panel: No results for input(s): VITAMINB12, FOLATE, FERRITIN, TIBC, IRON, RETICCTPCT in the last 72 hours. Sepsis Labs: No results for input(s): PROCALCITON, LATICACIDVEN in the last 168 hours.  Recent Results (from the past 240 hour(s))  Resp Panel by RT-PCR (Flu A&B, Covid) Nasopharyngeal Swab     Status: None   Collection Time: 08/07/20  8:10 PM    Specimen: Nasopharyngeal Swab; Nasopharyngeal(NP) swabs in vial transport medium  Result Value Ref Range Status   SARS Coronavirus 2 by RT PCR NEGATIVE NEGATIVE Final    Comment: (NOTE) SARS-CoV-2 target nucleic acids are NOT DETECTED.  The SARS-CoV-2 RNA is generally detectable in upper respiratory specimens during the acute phase of infection. The lowest concentration of SARS-CoV-2 viral copies this assay can detect is 138 copies/mL. A negative result does not preclude SARS-Cov-2 infection and should not be used as the sole basis for treatment or other patient management decisions. A negative result may occur with  improper specimen collection/handling, submission of specimen other than nasopharyngeal swab, presence of viral mutation(s) within the areas targeted by this assay, and inadequate number of viral copies(<138 copies/mL). A negative result must be combined with clinical observations, patient history, and  epidemiological information. The expected result is Negative.  Fact Sheet for Patients:  EntrepreneurPulse.com.au  Fact Sheet for Healthcare Providers:  IncredibleEmployment.be  This test is no t yet approved or cleared by the [place] FDA and  has been authorized for detection and/or diagnosis of SARS-CoV-2 by FDA under an Emergency Use Authorization (EUA). This EUA will remain  in effect (meaning this test can be used) for the duration of the COVID-19 declaration under Section 564(b)(1) of the Act, 21 U.S.C.section 360bbb-3(b)(1), unless the authorization is terminated  or revoked sooner.       Influenza A by PCR NEGATIVE NEGATIVE Final   Influenza B by PCR NEGATIVE NEGATIVE Final    Comment: (NOTE) The Xpert Xpress SARS-CoV-2/FLU/RSV plus assay is intended as an aid in the diagnosis of influenza from Nasopharyngeal swab specimens and should not be used as a sole basis for treatment. Nasal washings and aspirates are  unacceptable for Xpert Xpress SARS-CoV-2/FLU/RSV testing.  Fact Sheet for Patients: EntrepreneurPulse.com.au  Fact Sheet for Healthcare Providers: IncredibleEmployment.be  This test is not yet approved or cleared by the [place] FDA and has been authorized for detection and/or diagnosis of SARS-CoV-2 by FDA under an Emergency Use Authorization (EUA). This EUA will remain in effect (meaning this test can be used) for the duration of the COVID-19 declaration under Section 564(b)(1) of the Act, 21 U.S.C. section 360bbb-3(b)(1), unless the authorization is terminated or revoked.  Performed at [facility], [place] [address] [place], [place] [address]          Radiology Studies: No results found.      Scheduled Meds:  acyclovir  400 mg Oral BID   atenolol  25 mg Oral Daily   atorvastatin  20 mg Oral Daily   azithromycin  500 mg Oral Daily   budesonide (PULMICORT) nebulizer solution  0.5 mg Nebulization BID   buPROPion  150 mg Oral Daily   cyclobenzaprine  10 mg Oral QHS   enoxaparin (LOVENOX) injection  40 mg Subcutaneous Q24H   [START ON 08/11/2020] furosemide  20 mg Oral Daily   guaiFENesin  1,200 mg Oral BID   insulin aspart  0-15 Units Subcutaneous TID WC   insulin aspart  4 Units Subcutaneous TID WC   insulin glargine  10 Units Subcutaneous Daily   ipratropium-albuterol  3 mL Nebulization QID   levothyroxine  25 mcg Oral Q0600   methylPREDNISolone (SOLU-MEDROL) injection  60 mg Intravenous Q12H   metroNIDAZOLE  500 mg Oral Q12H   montelukast  10 mg Oral QHS   nicotine  14 mg Transdermal Daily   olopatadine  1 drop Both Eyes BID   pantoprazole  40 mg Oral Daily   pregabalin  150 mg Oral QHS   QUEtiapine  50 mg Oral QHS   roflumilast  500 mcg Oral Daily   venlafaxine XR  150 mg Oral Q breakfast   Continuous Infusions:   LOS: 3 days    Time spent: 35 minutes.     Elmarie Shiley, MD [facility]   If 7PM-7AM, please contact night-coverage [email or web address]  08/10/2020, 4:58 PM

## 2020-08-11 ENCOUNTER — Inpatient Hospital Stay (HOSPITAL_COMMUNITY): Payer: Medicare Other

## 2020-08-11 DIAGNOSIS — R0609 Other forms of dyspnea: Secondary | ICD-10-CM | POA: Diagnosis not present

## 2020-08-11 DIAGNOSIS — J441 Chronic obstructive pulmonary disease with (acute) exacerbation: Secondary | ICD-10-CM | POA: Diagnosis not present

## 2020-08-11 DIAGNOSIS — J9621 Acute and chronic respiratory failure with hypoxia: Secondary | ICD-10-CM | POA: Diagnosis not present

## 2020-08-11 DIAGNOSIS — J9622 Acute and chronic respiratory failure with hypercapnia: Secondary | ICD-10-CM | POA: Diagnosis not present

## 2020-08-11 LAB — ECHOCARDIOGRAM COMPLETE
Area-P 1/2: 3.85 cm2
Height: 68 in
S' Lateral: 2.9 cm
Weight: 3840 oz

## 2020-08-11 LAB — MRSA PCR SCREENING: MRSA by PCR: POSITIVE — AB

## 2020-08-11 LAB — BLOOD GAS, VENOUS
Acid-Base Excess: 6.1 mmol/L — ABNORMAL HIGH (ref 0.0–2.0)
Bicarbonate: 31.5 mmol/L — ABNORMAL HIGH (ref 20.0–28.0)
FIO2: 21
O2 Saturation: 89 %
Patient temperature: 98.6
pCO2, Ven: 49.9 mmHg (ref 44.0–60.0)
pH, Ven: 7.416 (ref 7.250–7.430)
pO2, Ven: 60.6 mmHg — ABNORMAL HIGH (ref 32.0–45.0)

## 2020-08-11 LAB — BASIC METABOLIC PANEL
Anion gap: 10 (ref 5–15)
BUN: 25 mg/dL — ABNORMAL HIGH (ref 6–20)
CO2: 32 mmol/L (ref 22–32)
Calcium: 9.3 mg/dL (ref 8.9–10.3)
Chloride: 94 mmol/L — ABNORMAL LOW (ref 98–111)
Creatinine, Ser: 1.03 mg/dL — ABNORMAL HIGH (ref 0.44–1.00)
GFR, Estimated: >60 mL/min (ref >60)
Glucose, Bld: 238 mg/dL — ABNORMAL HIGH (ref 70–99)
Potassium: 4.4 mmol/L (ref 3.5–5.1)
Sodium: 136 mmol/L (ref 135–145)

## 2020-08-11 LAB — GLUCOSE, CAPILLARY
Glucose-Capillary: 243 mg/dL — ABNORMAL HIGH (ref 70–99)
Glucose-Capillary: 163 mg/dL — ABNORMAL HIGH (ref 70–99)
Glucose-Capillary: 160 mg/dL — ABNORMAL HIGH (ref 70–99)
Glucose-Capillary: 293 mg/dL — ABNORMAL HIGH (ref 70–99)

## 2020-08-11 LAB — PROCALCITONIN: Procalcitonin: <0.1 ng/mL

## 2020-08-11 MED ORDER — IPRATROPIUM-ALBUTEROL 0.5-2.5 (3) MG/3ML IN SOLN
3.0000 mL | RESPIRATORY_TRACT | Status: DC
  Administered 2020-08-11 – 2020-08-12 (×9): 3 mL via RESPIRATORY_TRACT
  Filled 2020-08-11 (×9): qty 3

## 2020-08-11 MED ORDER — IPRATROPIUM-ALBUTEROL 0.5-2.5 (3) MG/3ML IN SOLN: 3.0000 mL | Freq: Four times a day (QID) | RESPIRATORY_TRACT | Status: DC

## 2020-08-11 MED ORDER — CHLORHEXIDINE GLUCONATE 0.12 % MT SOLN
15.0000 mL | Freq: Two times a day (BID) | OROMUCOSAL | Status: DC
  Administered 2020-08-11 – 2020-08-22 (×22): 15 mL via OROMUCOSAL
  Filled 2020-08-11 (×20): qty 15

## 2020-08-11 MED ORDER — MUPIROCIN 2 % EX OINT
1.0000 "application " | TOPICAL_OINTMENT | Freq: Two times a day (BID) | CUTANEOUS | Status: AC
  Administered 2020-08-11 – 2020-08-15 (×10): 1 via NASAL
  Filled 2020-08-11 (×3): qty 22

## 2020-08-11 MED ORDER — ORAL CARE MOUTH RINSE
15.0000 mL | Freq: Two times a day (BID) | OROMUCOSAL | Status: DC
  Administered 2020-08-11 – 2020-08-22 (×20): 15 mL via OROMUCOSAL

## 2020-08-11 MED ORDER — SODIUM CHLORIDE 0.9 % IV SOLN
3.0000 g | Freq: Four times a day (QID) | INTRAVENOUS | Status: DC
  Administered 2020-08-11 – 2020-08-14 (×12): 3 g via INTRAVENOUS
  Filled 2020-08-11 (×2): qty 3
  Filled 2020-08-11: qty 8
  Filled 2020-08-11 (×5): qty 3
  Filled 2020-08-11 (×2): qty 8
  Filled 2020-08-11 (×2): qty 3
  Filled 2020-08-11: qty 8

## 2020-08-11 MED ORDER — CHLORHEXIDINE GLUCONATE CLOTH 2 % EX PADS
6.0000 | MEDICATED_PAD | Freq: Every day | CUTANEOUS | Status: DC
  Administered 2020-08-11 – 2020-08-19 (×4): 6 via TOPICAL

## 2020-08-11 NOTE — Progress Notes (Signed)
  Echocardiogram 2D Echocardiogram has been performed.  Joan [name] 08/11/2020, 3:45 PM

## 2020-08-11 NOTE — Progress Notes (Signed)
PT Cancellation Note  Patient Details Name: Joan [name] MRN: [id number] DOB: [birth year]   Cancelled Treatment:    Reason Eval/Treat Not Completed: Medical issues which prohibited therapy, to ICU.   Claretha Cooper 08/11/2020, 11:46 AM  [place] Pager [phone] Office [phone]

## 2020-08-11 NOTE — Consult Note (Signed)
[facility] CM Inpatient Consult   08/11/2020  Joan [name] [birth year] [id number]  [facility] [place] [facility] [[facility]] Patient: [name]   Primary Care Provider: [facility] has embedded chronic care management services. Patient has been engaged with RN case manager and LCSW for CCM needs.   Plan:  Continue to follow for progression and disposition and follow up with [facility] Coordinator for post hospital support.   Of note, [facility] Care Management services does not replace or interfere with any services that are arranged by inpatient case management or social work.   Netta Cedars, MSN, [place] [facility] Liaison Nurse Mobile Phone [phone]  Toll free office [phone]

## 2020-08-11 NOTE — Progress Notes (Signed)
Inpatient Diabetes Program Recommendations  AACE/ADA: New Consensus Statement on Inpatient Glycemic Control (2015)  Target Ranges:  Prepandial:   less than 140 mg/dL      Peak postprandial:   less than 180 mg/dL (1-2 hours)      Critically ill patients:  140 - 180 mg/dL   Lab Results  Component Value Date   GLUCAP 243 (H) 08/11/2020   HGBA1C 6.8 (H) 08/09/2020    Review of Glycemic Control Results for MANAMI, TUTOR (MRN [id number]) as of 08/11/2020 09:50  Ref. Range 08/10/2020 07:11 08/10/2020 11:51 08/10/2020 16:29 08/10/2020 22:04 08/11/2020 07:29  Glucose-Capillary Latest Ref Range: 70 - 99 mg/dL 316 (H) 177 (H) 165 (H) 250 (H) 243 (H)    Diabetes history: DM 2 diagnosed 10/2019 Outpatient Diabetes medications: none Current orders for Inpatient glycemic control:  Novolog 0-15 units tid Lantus 10 units Novolog 4 units tid meal coverage  Solumedrol 60 mg Q6 Current A1c 6.9% on 6/14  Inpatient Diabetes Program Recommendations:     -  Consider increasing Lantus to 15 units while on steroids   Thanks, Tama Headings RN, MSN, BC-ADM Inpatient Diabetes Coordinator Team Pager [phone] (8a-5p)

## 2020-08-11 NOTE — Patient Instructions (Signed)
Visit Information  PATIENT GOALS:  Goals Addressed               This Visit's Progress     "to work on me" (pt-stated)        Timeframe:  Long-Range Goal Priority:  High Start Date:    05/09/2020                         Expected End Date:   10/25/2020                    Follow Up Date 08/21/2020  -continue seeking housing options and follow up with them asap -ask sister and others to assist with paperwork for housing applications  - begin a notebook of services in my neighborhood or community and resources provided by [employer], RNCM and CSW - call 911 when I need emergency help - follow-up on any referrals for help I am given and seek housing options on my own in the local paper and community - think ahead to make sure my need does not become an emergency - make a note about what I need to have by the phone or take with me, like an identification card or social security number have a back-up plan - have a back-up plan - make a list of family or friends that I can call  -follow up with counselor and Psychiatrist at scheduled appointment times to further discuss mental health concerns/needs for more frequent visits as discussed -make Neurologist aware of concerns related to memory issues, recent stuttering,etc -consider seeing a Nutritionist/RD for healthier diet/choices -      Why is this important?   Knowing how and where to find help for yourself or family in your neighborhood and community is an important skill.  You will want to take some steps to learn how.    Notes:          The patient verbalized understanding of instructions, educational materials, and care plan provided today and declined offer to receive copy of patient instructions, educational materials, and care plan.   Telephone follow up appointment with care management team member scheduled for:  Eduard Clos MSW, LCSW Licensed Clinical Social Worker [place] [phone]

## 2020-08-11 NOTE — Progress Notes (Signed)
RT NOTE:  Pt placed on BiPAP per MD order, pt tolerating well at this time. RT will continue to monitor pt status.

## 2020-08-11 NOTE — Progress Notes (Signed)
Pharmacy Antibiotic Note  Joan [name] is a 60 y.o. female admitted on 08/07/2020 with AECOPD.  Today, Pharmacy has been consulted for Unasyn dosing for pneumonia.  Patient is also on Flagyl 500 mg po every 12 hours for 7 days total for bacterial vaginosis.  Plan: Unasyn 3 g IV every 6 hours Monitor clinical picture, renal function F/U C&S, abx deescalation / LOT   Height: 5\' 8"  (172.7 cm) Weight: 108.9 kg (240 lb) IBW/kg (Calculated) : 63.9  Temp (24hrs), Avg:98 F (36.7 C), Min:96.8 F (36 C), Max:98.8 F (37.1 C)  Recent Labs  Lab 08/07/20 1943 08/08/20 0550 08/09/20 0537 08/09/20 1040 08/10/20 0508 08/10/20 1820 08/10/20 1951 08/11/20 0523  WBC 9.3 10.8* 21.1*  --  13.2*  --   --   --   CREATININE 0.84 1.21* 1.17* 0.96  --  0.96  --  1.03*  LATICACIDVEN  --   --   --   --   --   --  2.2*  --     Estimated Creatinine Clearance: 77 mL/min (A) (by C-G formula based on SCr of 1.03 mg/dL (H)).    Allergies  Allergen Reactions   Keflex [Cephalexin] Rash and Other (See Comments)    Has tolerated amoxicillin since had keflex   Prednisone Other (See Comments)    "counter reacts"   Shellfish Allergy Shortness Of Breath, Nausea And Vomiting and Other (See Comments)    Stomach cramps   Tetracyclines & Related Anaphylaxis, Swelling and Other (See Comments)    Throat swelling requiring hospitalization   Dilaudid [Hydromorphone Hcl] Other (See Comments)    "Lethargy"   Incruse Ellipta [Umeclidinium Bromide] Nausea Only   Other    Tuberculin Tests Other (See Comments)    False postive    Antimicrobials this admission: 6/14 azithromycin >> 6/17 6/16 metronidazole >> (6/22) 6/17 Unasyn >>  Dose adjustments this admission:   Microbiology results: 6/17 MRSA PCR: positive  Thank you for allowing pharmacy to be a part of this patient's care.  Efraim Kaufmann, PharmD, BCPS 08/11/2020 7:26 PM

## 2020-08-11 NOTE — Progress Notes (Signed)
OT Cancellation Note  Patient Details Name: Joan [name] MRN: [id number] DOB: 09-Jan-1961   Cancelled Treatment:    Reason Eval/Treat Not Completed: Patient not medically ready. Pt transferring off current unit for higher level of care. On BiPAP now per RT note. Plan to re-attempt at a later date.   Tyrone Schimke, OT Acute Rehabilitation Services Pager: [phone] Office: [phone]  08/11/2020, 9:52 AM

## 2020-08-11 NOTE — Progress Notes (Signed)
PROGRESS NOTE    Joan [name]  SHF:[id number] DOB: 05-20-1960 DOA: 08/07/2020 PCP: [name], MD   Brief Narrative:  60 year old with past medical history significant for COPD, bipolar disorder, PTSD, chronic respiratory failure on home oxygen 3 L, who presented to the ED complaining of worsening shortness of breath associated with cough, wheezing.  She is a current smoker 1 pack/day. Chest x-ray on presentation did not show any acute pulmonary findings.  Patient was admitted for management of COPD exacerbation.   Assessment & Plan:   Active Problems:   COPD exacerbation ([place])  1-Acute on Chronic Hypoxic Respiratory Failure: Secondary to COPD exacerbation -Currently on 3 L home oxygen -Continue with IV steroids, bronchodilators, incentive spirometry. -Continue with azithromycin. -Continue with guaifenesin. -ECHO; normal.  Patient was more lethargic today, transfer to progressive unit for closer monitor. BIPAP ordered and Blood gas.  Chest x ray with opacity vs atelectasis. Patient on azithromycin. Will add Unasyn.  Appreciate Pulmonary evaluation.   2-Tobacco use: Counseling provided.  3-Bacteria vaginosis: Continue with flagyl for 7 days.  Continue with Clotrimazole to cover for Candida infection.   4-Hypothyroidism: Continue with Synthroid.  History of depression/bipolar/PTSD: Continue with Wellbutrin and Effexor.  GERD: Continue with PPI.  Hyperlipidemia; on statins.   Leukocytosis: Secondary to a steroid.  Steroid-induced hyperglycemia; SSI. On lantus.  HB A1c 6.8  AKI resolved. Sleep Apnea; CPAP/  Morbid obesity. BMI 36.4 HTN; on HCTZ, change to lasix staring tomorrow to help with LE edema.  BP elevated will start low dose hydralazine.   Estimated body mass index is 36.49 kg/m as calculated from the following:   Height as of this encounter: 5\' 8"  (1.727 m).   Weight as of this encounter: 108.9 kg.   DVT prophylaxis: Lovenox Code  Status: Full code Family Communication: care discussed with patient.  Disposition Plan:  Status is: Inpatient  Remains inpatient appropriate because:IV treatments appropriate due to intensity of illness or inability to take PO  Dispo: The patient is from: Home              Anticipated d/c is to: Home              Patient currently is not medically stable to d/c.   Difficult to place patient No        Consultants:  none  Procedures:  ECHO  Antimicrobials:    Subjective: She is more lethargic this AM. She follows some command. She report SOB  Objective: Vitals:   08/11/20 0032 08/11/20 0417 08/11/20 0459 08/11/20 0759  BP:   (!) 149/75   Pulse:   73   Resp:   (!) 22   Temp:   (!) 97.5 F (36.4 C)   TempSrc:      SpO2: 95% 95% 99% 97%  Weight:      Height:        Intake/Output Summary (Last 24 hours) at 08/11/2020 0853 Last data filed at 08/10/2020 1829 Gross per 24 hour  Intake 2124 ml  Output --  Net 2124 ml    Filed Weights   08/07/20 1850  Weight: 108.9 kg    Examination:  General exam: Lethargic Respiratory system: BL wheezing.  Cardiovascular system: S 1, S 2 RRR Gastrointestinal system: BS present, soft, nt Central nervous system: alert Extremities: LE   Data Reviewed: I have personally reviewed following labs and imaging studies  CBC: Recent Labs  Lab 08/07/20 1943 08/08/20 0550 08/09/20 0537 08/10/20 0508  WBC 9.3 10.8*  21.1* 13.2*  NEUTROABS 7.4  --   --  11.5*  HGB 13.1 12.5 13.5 11.6*  HCT 39.8 37.1 42.2 35.8*  MCV 92.6 92.5 95.3 93.7  PLT 222 204 244 517    Basic Metabolic Panel: Recent Labs  Lab 08/08/20 0550 08/09/20 0537 08/09/20 1040 08/10/20 1820 08/11/20 0523  NA 134* 136 136 134* 136  K 5.1 5.9* 4.0 4.4 4.4  CL 97* 100 98 89* 94*  CO2 27 27 29  32 32  GLUCOSE 249* 165* 226* 214* 238*  BUN 11 17 19  21* 25*  CREATININE 1.21* 1.17* 0.96 0.96 1.03*  CALCIUM 8.5* 9.4 9.1 9.7 9.3    GFR: Estimated Creatinine  Clearance: 77 mL/min (A) (by C-G formula based on SCr of 1.03 mg/dL (H)). Liver Function Tests: Recent Labs  Lab 08/07/20 1943 08/09/20 0537  AST 29 34  ALT 30 35  ALKPHOS 117 97  BILITOT 0.3 1.5*  PROT 7.1 7.5  ALBUMIN 4.0 3.9    Recent Labs  Lab 08/07/20 1943  LIPASE 32    No results for input(s): AMMONIA in the last 168 hours. Coagulation Profile: No results for input(s): INR, PROTIME in the last 168 hours. Cardiac Enzymes: No results for input(s): CKTOTAL, CKMB, CKMBINDEX, TROPONINI in the last 168 hours. BNP (last 3 results) No results for input(s): PROBNP in the last 8760 hours. HbA1C: Recent Labs    08/09/20 0537  HGBA1C 6.8*    CBG: Recent Labs  Lab 08/10/20 0711 08/10/20 1151 08/10/20 1629 08/10/20 2204 08/11/20 0729  GLUCAP 316* 177* 165* 250* 243*    Lipid Profile: No results for input(s): CHOL, HDL, LDLCALC, TRIG, CHOLHDL, LDLDIRECT in the last 72 hours. Thyroid Function Tests: No results for input(s): TSH, T4TOTAL, FREET4, T3FREE, THYROIDAB in the last 72 hours. Anemia Panel: No results for input(s): VITAMINB12, FOLATE, FERRITIN, TIBC, IRON, RETICCTPCT in the last 72 hours. Sepsis Labs: Recent Labs  Lab 08/10/20 1951 08/11/20 0523  PROCALCITON <0.10 <0.10  LATICACIDVEN 2.2*  --     Recent Results (from the past 240 hour(s))  Resp Panel by RT-PCR (Flu A&B, Covid) Nasopharyngeal Swab     Status: None   Collection Time: 08/07/20  8:10 PM   Specimen: Nasopharyngeal Swab; Nasopharyngeal(NP) swabs in vial transport medium  Result Value Ref Range Status   SARS Coronavirus 2 by RT PCR NEGATIVE NEGATIVE Final    Comment: (NOTE) SARS-CoV-2 target nucleic acids are NOT DETECTED.  The SARS-CoV-2 RNA is generally detectable in upper respiratory specimens during the acute phase of infection. The lowest concentration of SARS-CoV-2 viral copies this assay can detect is 138 copies/mL. A negative result does not preclude SARS-Cov-2 infection and  should not be used as the sole basis for treatment or other patient management decisions. A negative result may occur with  improper specimen collection/handling, submission of specimen other than nasopharyngeal swab, presence of viral mutation(s) within the areas targeted by this assay, and inadequate number of viral copies(<138 copies/mL). A negative result must be combined with clinical observations, patient history, and epidemiological information. The expected result is Negative.  Fact Sheet for Patients:  EntrepreneurPulse.com.au  Fact Sheet for Healthcare Providers:  IncredibleEmployment.be  This test is no t yet approved or cleared by the [place] FDA and  has been authorized for detection and/or diagnosis of SARS-CoV-2 by FDA under an Emergency Use Authorization (EUA). This EUA will remain  in effect (meaning this test can be used) for the duration of the COVID-19 declaration under Section  564(b)(1) of the Act, 21 U.S.C.section 360bbb-3(b)(1), unless the authorization is terminated  or revoked sooner.       Influenza A by PCR NEGATIVE NEGATIVE Final   Influenza B by PCR NEGATIVE NEGATIVE Final    Comment: (NOTE) The Xpert Xpress SARS-CoV-2/FLU/RSV plus assay is intended as an aid in the diagnosis of influenza from Nasopharyngeal swab specimens and should not be used as a sole basis for treatment. Nasal washings and aspirates are unacceptable for Xpert Xpress SARS-CoV-2/FLU/RSV testing.  Fact Sheet for Patients: EntrepreneurPulse.com.au  Fact Sheet for Healthcare Providers: IncredibleEmployment.be  This test is not yet approved or cleared by the [place] FDA and has been authorized for detection and/or diagnosis of SARS-CoV-2 by FDA under an Emergency Use Authorization (EUA). This EUA will remain in effect (meaning this test can be used) for the duration of the COVID-19 declaration  under Section 564(b)(1) of the Act, 21 U.S.C. section 360bbb-3(b)(1), unless the authorization is terminated or revoked.  Performed at [facility], [place] [address] [place], [place] [address]           Radiology Studies: DG CHEST PORT 1 VIEW  Result Date: 08/10/2020 CLINICAL DATA:  Respiratory distress. EXAM: PORTABLE CHEST 1 VIEW COMPARISON:  08/07/2020 FINDINGS: 1801 hours. Subtle airspace disease noted right base new since prior. Fat pad noted left base as seen on CT chest 03/08/2020. The cardiopericardial silhouette is within normal limits for size. The visualized bony structures of the thorax show no acute abnormality. Telemetry leads overlie the chest. IMPRESSION: New subtle opacity at the right base compatible with atelectasis or pneumonia. Electronically Signed   By: Misty Stanley M.D.   On: 08/10/2020 18:18        Scheduled Meds:  acyclovir  400 mg Oral BID   atorvastatin  20 mg Oral Daily   azithromycin  500 mg Oral Daily   budesonide (PULMICORT) nebulizer solution  0.5 mg Nebulization BID   buPROPion  150 mg Oral Daily   clotrimazole  1 Applicatorful Vaginal QHS   cyclobenzaprine  10 mg Oral QHS   enoxaparin (LOVENOX) injection  40 mg Subcutaneous Q24H   furosemide  20 mg Oral Daily   guaiFENesin  1,200 mg Oral BID   hydrALAZINE  10 mg Oral Q8H   insulin aspart  0-15 Units Subcutaneous TID WC   insulin aspart  4 Units Subcutaneous TID WC   insulin glargine  10 Units Subcutaneous Daily   ipratropium-albuterol  3 mL Nebulization Q4H   levothyroxine  25 mcg Oral Q0600   methylPREDNISolone (SOLU-MEDROL) injection  60 mg Intravenous Q6H   metroNIDAZOLE  500 mg Oral Q12H   montelukast  10 mg Oral QHS   nicotine  14 mg Transdermal Daily   olopatadine  1 drop Both Eyes BID   pantoprazole  40 mg Oral Daily   pregabalin  150 mg Oral QHS   QUEtiapine  50 mg Oral QHS   venlafaxine XR  150 mg Oral Q breakfast   Continuous Infusions:   LOS: 4 days     Time spent: 35 minutes.     Elmarie Shiley, MD [facility]   If 7PM-7AM, please contact night-coverage [email or web address]  08/11/2020, 8:53 AM

## 2020-08-11 NOTE — Chronic Care Management (AMB) (Signed)
Chronic Care Management    Clinical Social Work Note  08/11/2020 Name: Joan [name] MRN: [id number] DOB: [birth year]  Joan [name] is a 60 y.o. year old female who is a primary care patient of Sharlet Salina Real Cons, MD. The CCM team was consulted to assist the patient with chronic disease management and/or care coordination needs related to: [employer]  and [place] and Resources.   Engaged with patient by telephone for follow up visit in response to provider referral for social work chronic care management and care coordination services.   Consent to Services:  The patient was given information about Chronic Care Management services, agreed to services, and gave verbal consent prior to initiation of services.  Please see initial visit note for detailed documentation.   Patient agreed to services and consent obtained.   Assessment: Review of patient past medical history, allergies, medications, and health status, including review of relevant consultants reports was performed today as part of a comprehensive evaluation and provision of chronic care management and care coordination services.     SDOH (Social Determinants of Health) assessments and interventions performed:    Advanced Directives Status: See Care Plan for related entries.  CCM Care Plan  Allergies  Allergen Reactions   Keflex [Cephalexin] Rash and Other (See Comments)    Has tolerated amoxicillin since had keflex   Prednisone Other (See Comments)    "counter reacts"   Shellfish Allergy Shortness Of Breath, Nausea And Vomiting and Other (See Comments)    Stomach cramps   Tetracyclines & Related Anaphylaxis, Swelling and Other (See Comments)    Throat swelling requiring hospitalization   Dilaudid [Hydromorphone Hcl] Other (See Comments)    "Lethargy"   Incruse Ellipta [Umeclidinium Bromide] Nausea Only   Other    Tuberculin Tests Other (See Comments)    False postive     Facility-Administered Encounter Medications as of 08/10/2020  Medication   acetaminophen (TYLENOL) tablet 650 mg   Or   acetaminophen (TYLENOL) suppository 650 mg   acyclovir (ZOVIRAX) tablet 400 mg   albuterol (PROVENTIL) (2.5 MG/3ML) 0.083% nebulizer solution 2.5 mg   ALPRAZolam (XANAX) tablet 0.5 mg   atorvastatin (LIPITOR) tablet 20 mg   [COMPLETED] azithromycin (ZITHROMAX) tablet 500 mg   budesonide (PULMICORT) nebulizer solution 0.5 mg   buPROPion (WELLBUTRIN XL) 24 hr tablet 150 mg   Camphor-Menthol-Methyl Sal 3.03-02-08 % PTCH 1 patch   chlorhexidine (PERIDEX) 0.12 % solution 15 mL   Chlorhexidine Gluconate Cloth 2 % PADS 6 each   clotrimazole (GYNE-LOTRIMIN) vaginal cream 1 Applicatorful   enoxaparin (LOVENOX) injection 40 mg   furosemide (LASIX) tablet 20 mg   guaiFENesin (MUCINEX) 12 hr tablet 1,200 mg   hydrALAZINE (APRESOLINE) tablet 10 mg   insulin aspart (novoLOG) injection 0-15 Units   insulin aspart (novoLOG) injection 4 Units   insulin glargine (LANTUS) injection 10 Units   ipratropium-albuterol (DUONEB) 0.5-2.5 (3) MG/3ML nebulizer solution 3 mL   levothyroxine (SYNTHROID) tablet 25 mcg   magnesium hydroxide (MILK OF MAGNESIA) suspension 30 mL   MEDLINE mouth rinse   methylPREDNISolone sodium succinate (SOLU-MEDROL) 125 mg/2 mL injection 60 mg   metroNIDAZOLE (FLAGYL) tablet 500 mg   montelukast (SINGULAIR) tablet 10 mg   mupirocin ointment (BACTROBAN) 2 % 1 application   nicotine (NICODERM CQ - dosed in mg/24 hours) patch 14 mg   olopatadine (PATANOL) 0.1 % ophthalmic solution 1 drop   ondansetron (ZOFRAN) tablet 4 mg   Or   ondansetron (ZOFRAN) injection  4 mg   pantoprazole (PROTONIX) EC tablet 40 mg   polyethylene glycol (MIRALAX / GLYCOLAX) packet 17 g   pregabalin (LYRICA) capsule 150 mg   QUEtiapine (SEROQUEL) tablet 50 mg   [COMPLETED] sodium chloride 0.9 % bolus 500 mL   venlafaxine XR (EFFEXOR-XR) 24 hr capsule 150 mg   [DISCONTINUED]  chlorpheniramine-HYDROcodone (TUSSIONEX) 10-8 MG/5ML suspension 5 mL   [DISCONTINUED] cyclobenzaprine (FLEXERIL) tablet 10 mg   [DISCONTINUED] ipratropium-albuterol (DUONEB) 0.5-2.5 (3) MG/3ML nebulizer solution 3 mL   [DISCONTINUED] ipratropium-albuterol (DUONEB) 0.5-2.5 (3) MG/3ML nebulizer solution 3 mL   [DISCONTINUED] zolpidem (AMBIEN) tablet 5 mg   Outpatient Encounter Medications as of 08/10/2020  Medication Sig Note   acetaminophen (TYLENOL) 500 MG tablet Take 2 tablets (1,000 mg total) by mouth every 8 (eight) hours as needed for mild pain. (Patient not taking: No sig reported)    acyclovir (ZOVIRAX) 400 MG tablet TAKE 1 TABLET BY MOUTH  TWICE DAILY    albuterol (PROVENTIL) (2.5 MG/3ML) 0.083% nebulizer solution Take 3 mLs (2.5 mg total) by nebulization 2 (two) times daily. And every 6 hours as needed.  DX: J44.9    albuterol (VENTOLIN HFA) 108 (90 Base) MCG/ACT inhaler Inhale 2 puffs into the lungs every 6 (six) hours as needed for wheezing or shortness of breath. (Patient taking differently: Inhale 2 puffs into the lungs in the morning and at bedtime.)    atenolol (TENORMIN) 25 MG tablet TAKE 1 TABLET BY MOUTH  DAILY    atorvastatin (LIPITOR) 20 MG tablet TAKE 1 TABLET BY MOUTH  DAILY    Budeson-Glycopyrrol-Formoterol (BREZTRI AEROSPHERE) 160-9-4.8 MCG/ACT AERO Inhale 2 puffs into the lungs in the morning and at bedtime. 08/07/2020: Need refills   budesonide-formoterol (SYMBICORT) 160-4.5 MCG/ACT inhaler Inhale 2 puffs into the lungs 2 (two) times daily.    buPROPion (WELLBUTRIN XL) 150 MG 24 hr tablet Take 1 tablet (150 mg total) by mouth daily.    Camphor-Menthol-Methyl Sal 3.03-02-08 % PTCH Apply 1 patch topically daily as needed (pain).    cyclobenzaprine (FLEXERIL) 10 MG tablet TAKE 1 TABLET BY MOUTH AT  BEDTIME    diazepam (VALIUM) 5 MG tablet Take oral route 45 minutes prior to procedure. Repeat PRN 20 minutes.    hydrochlorothiazide (HYDRODIURIL) 25 MG tablet TAKE 1 TABLET BY MOUTH   DAILY    Ipratropium-Albuterol (COMBIVENT RESPIMAT) 20-100 MCG/ACT AERS respimat Inhale 1 puff into the lungs every 6 (six) hours as needed for wheezing. (Patient taking differently: Inhale 1 puff into the lungs every 6 (six) hours as needed for wheezing or shortness of breath.)    levothyroxine (SYNTHROID) 25 MCG tablet TAKE 1 TABLET BY MOUTH  DAILY BEFORE BREAKFAST    Liniments (SALONPAS ARTHRITIS PAIN RELIEF EX) Apply 1 patch topically daily as needed (to painful sites- remove as directed).     montelukast (SINGULAIR) 10 MG tablet TAKE 1 TABLET BY MOUTH AT  BEDTIME    naproxen (NAPROSYN) 500 MG tablet TAKE 1 TABLET BY MOUTH  TWICE DAILY WITH A MEAL (Patient taking differently: Take 500 mg by mouth 2 (two) times daily as needed for moderate pain.)    nystatin-triamcinolone ointment (MYCOLOG) Apply 1 application topically 2 (two) times daily as needed (applied to skin folds/groins/under breast skin irritation rash.). (Patient taking differently: Apply 1 application topically 2 (two) times daily as needed (to skin folds/groin/under breast skin- for irritation/rashes).)    OXYGEN Inhale 2-3 L/min into the lungs See admin instructions. Inhale  2-3 L/min of oxygen into the lungs  w/CPAP at bedtime and as needed for shortness of breath with "strenuous activity" during the day    pantoprazole (PROTONIX) 40 MG tablet TAKE 1 TABLET BY MOUTH  DAILY    PATADAY 0.2 % SOLN Apply 1 drop to eye daily.    polyethylene glycol (MIRALAX / GLYCOLAX) 17 g packet Take 17 g by mouth daily as needed for mild constipation.    pregabalin (LYRICA) 150 MG capsule TAKE 1 CAPSULE BY MOUTH AT  BEDTIME    QUEtiapine (SEROQUEL) 50 MG tablet Take 1 tablet (50 mg total) by mouth at bedtime.    roflumilast (DALIRESP) 500 MCG TABS tablet Take 1 tablet (500 mcg total) by mouth daily.    traZODone (DESYREL) 100 MG tablet TAKE 1 TABLET BY MOUTH AT  BEDTIME    venlafaxine XR (EFFEXOR-XR) 150 MG 24 hr capsule TAKE 1 CAPSULE BY MOUTH  DAILY  WITH BREAKFAST     Patient Active Problem List   Diagnosis Date Noted   COPD exacerbation ([place]) 08/07/2020   PTSD (post-traumatic stress disorder) 02/09/2020   Mood disorder in conditions classified elsewhere 02/09/2020   Generalized anxiety disorder 02/09/2020   Type II diabetes mellitus with manifestations ([place]) 10/29/2019   Coarse tremors 10/28/2019   Chronic rhinitis 10/21/2019   Allergic rhinitis 06/16/2019   Cervical spondylosis without myelopathy 12/18/2018   Hypertension    Encounter for general adult medical examination with abnormal findings 11/06/2018   Tobacco abuse 01/15/2018   Hypomagnesemia 12/29/2017   Pulmonary nodule 11/11/2017   Carotid artery disease ([place]) 11/05/2017   OSA on CPAP 08/07/2017   Gastroesophageal reflux disease 06/23/2017   Hypothyroidism due to acquired atrophy of thyroid 04/05/2017   Primary osteoarthritis involving multiple joints 04/05/2017   Chronic constipation 02/10/2017   Iron deficiency anemia due to dietary causes 02/10/2017   Osteoarthritis 02/10/2017   Hyperlipidemia associated with type 2 diabetes mellitus ([place]) 02/10/2017   Chronic respiratory failure with hypoxia ([place]) 12/16/2016   Essential hypertension 11/29/2016   Vocal cord dysfunction    COPD, severe ([place]) 09/24/2016   Anxiety 09/24/2016    Conditions to be addressed/monitored: COPD, Anxiety, and Depression; Financial constraints related to seeking rental/housing, Limited social support, Mental Health Concerns , Family and relationship dysfunction, and Lacks knowledge of community resources  Care Plan : LCSW Plan Of Care  Updates made by Deirdre Peer, LCSW since 08/11/2020 12:00 AM     Problem: Need for patient-centered plan of care to promote improved mental health and overal quality of life.   Priority: High     Long-Range Goal: Develop self management plan to improve pt's mental health and overall health/quality of life.   Start Date: 05/09/2020  Expected End  Date: 09/08/2020  This Visit's Progress: On track  Recent Progress: On track  Priority: High  Note:   Current barriers:   Patient continues to reside with her brother while seeking alternative housing. Pt reports things are fairly calm and "better" with her brother- "my brother has said I can stay but I'm still looking for my own place".  She is also contacting her insurance to ask if she will be able to keep her [place] [employer] IF she moves to [place]; she is looking at rental property that is is [place] the state [place].  Pt also reports she may want to pursue more frequent/intense counseling; "my brother says I need to go to inpatient care to deal with my past....but I suppressed it and not sure I want  to". CSW advised pt that she is likely not in need of inpt care but would encourage her to consider and discuss this further with her counselor and Providers.   Pt also admits to not being very motivated; "still smoking and not eating right". Pt states she does not eat all day and then eats the wrong things. CSW will ask RNCM to follow up as appropriate with the nutrition concerns.  Patient in need of assistance with connecting to community resources for Limited social support, Housing barriers, Mental Health Concerns, Family and relationship dysfunction, and lacks knowledge of community resource: housing. Acknowledges deficits with meeting this unmet need Patient is unable to independently navigate community resource options without care coordination support Patient with limited ability to complete applications for housing due to reported reading limitations Clinical Goals:  patient will work with SW to address concerns related to mental health support/needs patient will work with Care Guide  to address needs related to housing and other community needs as identified patient will follow up with Psychiatrist and Counselor to further discuss concerns and needs as directed by  SW Clinical Interventions:  Pt reports currently hospitalized due to "breathing exacerbation".  "My smoking triggered it".  Pt also reports she is still seeking housing- she is frustrated with the challenges faced. CSW offered support and inquired about the resources provided by CSW and Care Guide- encouraged her to follow up on these as well as to search for other sites around her area.    Collaboration with [name], MD regarding development and update of comprehensive plan of care as evidenced by provider attestation and co-signature Pt reports continued mental health support/sessions with her outpatient therapist and denies any current significant depression/anxiety. Pt denies SI/HI Inter-disciplinary care team collaboration (see longitudinal plan of care) Assessment of needs, barriers , agencies contacted, as well as how impacting  Review various resources, discussed options and provided patient information about Financial constraints related to housing, etc, Limited social support, Housing barriers, Mental Health Concerns , and Family and relationship dysfunction Collaborated with appropriate clinical care team members regarding patient needs Financial constraints related to housing, Limited social support, Housing barriers, Mental Health Concerns , and Family and relationship dysfunction, COPD, Anxiety, and Depression Patient interviewed and appropriate assessments performed Referred patient to community resources care guide team for assistance with housing and other community support/resources as identified Provided mental health counseling with regard to pt concerns/needs (mental health diagnosis or concern) Discussed plans with patient for ongoing care management follow up and provided patient with direct contact information for care management team Other interventions provided: Motivational Interviewing Solution-Focused Strategies Brief CBT Emotional/Supportive  Counseling Psychoeducation and/or Health Education Problem Solving Patient Goals:   - call 911 when I need emergency help - follow-up on any referrals for help I am given and seek housing options on my own in the local paper and community - think ahead to make sure my need does not become an emergency - make a note about what I need to have by the phone or take with me, like an identification card or social security number have a back-up plan - have a back-up plan - make a list of family or friends that I can call  -follow up with counselor and Psychiatrist at scheduled appointment times to further discuss mental health concerns/needs for more frequent visits as discussed -make Neurologist aware of concerns related to memory issues, recent stuttering,etc -consider seeing a Nutritionist/RD for healthier diet/choices  Follow Up Plan:  Appointment scheduled for SW follow up with client by phone on:  08/21/2020      Follow Up Plan: Appointment scheduled for SW follow up with client by phone on: 08/21/2020      Eduard Clos MSW, [place] Licensed Clinical Social Worker [place] [phone]

## 2020-08-11 NOTE — Progress Notes (Signed)
NAME:  Joan [name], MRN:  [id number], DOB:  [birth year], LOS: 4 ADMISSION DATE:  08/07/2020, CONSULTATION DATE:  08/10/20 REFERRING MD:  DR Tyrell Antonio, CHIEF COMPLAINT:  Acute COPD exacerbation -slow to resolve   BRIEF:   60 year old female with chronic hypercapnia as of 2018 but uses CPAP for sleep apnea, polypharmacy, Gold stage III bordering on Gold stage IV COPD [see PFTs below], polypharmacy, anxiety and multiple medical problems including PTSD, bipolar disorder [refuses ABG].  In 2018 noted to have diastolic dysfunction presented with classic symptoms of COPD exacerbation.  Has been on home atenolol, 4 times daily bronchodilator and Solu-Medrol 60 mg twice daily.  However she has not improved had acute episode of dyspnea couple of times today including at the time of CCM evaluation around 5 PM/5:30 PM 08/10/2020.  She is received 1 dose of Lasix since admission today.  Noted to have some pedal edema.  CCM called because of respiratory distress.  She got a dose of nebulizer and seemed to improve.  She was refusing blood gas.  Her COVID PCR and flu PCR negative but does not have multiplex respiratory virus panel PCR  Pertinent  Medical History  Results for KEENYA, MATERA (MRN [id number]) as of 08/10/2020 18:25    Ref. Range 09/24/2016 15:48  pH, Ven Latest Ref Range: 7.250 - 7.430  7.428  pCO2, Ven Latest Ref Range: 44.0 - 60.0 mmHg 52.7  pO2, Ven Latest Ref Range: 32.0 - 45.0 mmHg 46.0 (H)   August 2021 blood IgE and blood allergy test panel negative with normal alpha-1 antitrypsin level in 2018  PFT Results Latest Ref Rng & Units 03/17/2019 10/30/2016  FVC-Pre L 2.35 2.16  FVC-Predicted Pre % 64 58  FVC-Post L 2.66 2.53  FVC-Predicted Post % 73 68  Pre FEV1/FVC % % 45 49  Post FEV1/FCV % % 47 49  FEV1-Pre L 1.06 1.05  FEV1-Predicted Pre % 37 36  FEV1-Post L 1.24 1.23  DLCO uncorrected ml/min/mmHg 9.80 9.40  DLCO UNC% % 44 34  DLCO corrected ml/min/mmHg - 9.52  DLCO COR  %Predicted % - 35  DLVA Predicted % 54 42  TLC L 5.72 5.36  TLC % Predicted % 106 100  RV % Predicted % 146 147    has a past medical history of Acute respiratory failure with hypoxia ([place]), Alcoholism and drug addiction in family, Anxiety, Appendicitis, Arthritis, Asthma, Bipolar disorder ([place]), Chronic bronchitis ([facility]), Chronic leg pain, Chronic lower back pain, COPD (chronic obstructive pulmonary disease) ([place]), Depression, Diabetes mellitus without complication ([place]), Diabetic neuropathy ([place]), Early cataracts, bilateral, Emphysema of lung ([place]), Generalized anxiety disorder (02/09/2020), GERD (gastroesophageal reflux disease), Headache, Hepatitis C, High cholesterol, Hypertension, Hypothyroidism, Incarcerated ventral hernia (04/04/2017), Mood disorder in conditions classified elsewhere (02/09/2020), On home oxygen therapy, OSA on CPAP, Paranoid ([place]), Pneumonia, Positive PPD, PTSD (post-traumatic stress disorder) (02/09/2020), Tachycardia, Thyroid disease, Wears glasses, and Wears partial dentures.   has a past surgical history that includes Foot surgery (Bilateral); Upper gi endoscopy; Colonoscopy; Hernia repair; Umbilical hernia repair (~ 2013); Incision and drainage (~ 2014/2015); Lipoma excision (Right); Posterior lumbar fusion (2015/2016 X 2); Back surgery; Laceration repair (Right); Nasal septum surgery; Cyst excision; Tubal ligation; Dilation and curettage of uterus; Cardiac catheterization; Total hip arthroplasty (Right, 02/05/2017); Laparoscopic incisional / umbilical / ventral hernia repair (04/04/2017); Incisional hernia repair (N/A, 04/04/2017); Total hip arthroplasty (Left, 06/18/2017); Colonoscopy with propofol (N/A, 11/24/2018); Esophagogastroduodenoscopy (egd) with propofol (N/A, 11/24/2018); biopsy (11/24/2018); Total hip revision (Left, 01/26/2019); laparoscopic appendectomy (N/A,  03/31/2019); laparoscopic appendectomy (N/A, 07/09/2019); and Sinoscopy.    Significant Hospital  Events: Including procedures, antibiotic start and stop dates in addition to other pertinent events   08/07/2020 - admit 6/176 - ccm consult 6/17 -moved to sdu  Interim History / Subjective:    08/11/2020-refusing arterial blood gas.  VBG yesterday did not show carbadox retention.  She will use CPAP.  This morning's apparently she was lethargic after getting her usual psych and anxiety meds and she was moved to the ICU.  Currently on 3 to 5 L nasal cannula saturating well even when she exerts but having significant dyspnea.  Has waxing and waning hoarse voice although there is no thrush.  She is complains of class IV dyspnea but nursing reports that she was able to transfer out of the bed without any desaturations.  Appears quite anxious  Objective   Blood pressure (!) 142/97, pulse 71, temperature 97.7 F (36.5 C), temperature source Axillary, resp. rate 17, height 5\' 8"  (1.727 m), weight 108.9 kg, SpO2 100 %.        Intake/Output Summary (Last 24 hours) at 08/11/2020 1132 Last data filed at 08/10/2020 1829 Gross per 24 hour  Intake 1534 ml  Output --  Net 1534 ml   Filed Weights   08/07/20 1850  Weight: 108.9 kg    Examination: Obese female in the bed with oxygen nasal cannula.  Mallampati class III-4.  No oral thrush.  Has squeaky voice.  Voice is hoarse than yesterday.  Mild pedal edema.  Has wheezing but is improved compared to yesterday and is distant and variable.  Normal heart sounds.  No stigmata of connective tissue disease.  Echo pending Respiratory virus panel pending  Labs/imaging that I havepersonally reviewed  (right click and "Reselect all SmartList Selections" daily)     LABS    PULMONARY Recent Labs  Lab 08/10/20 1951  HCO3 31.3*  O2SAT 99.1    CBC Recent Labs  Lab 08/08/20 0550 08/09/20 0537 08/10/20 0508  HGB 12.5 13.5 11.6*  HCT 37.1 42.2 35.8*  WBC 10.8* 21.1* 13.2*  PLT 204 244 245    COAGULATION No results for input(s): INR in the  last 168 hours.  CARDIAC  No results for input(s): TROPONINI in the last 168 hours. No results for input(s): PROBNP in the last 168 hours.   CHEMISTRY Recent Labs  Lab 08/08/20 0550 08/09/20 0537 08/09/20 1040 08/10/20 1820 08/11/20 0523  NA 134* 136 136 134* 136  K 5.1 5.9* 4.0 4.4 4.4  CL 97* 100 98 89* 94*  CO2 27 27 29  32 32  GLUCOSE 249* 165* 226* 214* 238*  BUN 11 17 19  21* 25*  CREATININE 1.21* 1.17* 0.96 0.96 1.03*  CALCIUM 8.5* 9.4 9.1 9.7 9.3   Estimated Creatinine Clearance: 77 mL/min (A) (by C-G formula based on SCr of 1.03 mg/dL (H)).   LIVER Recent Labs  Lab 08/07/20 1943 08/09/20 0537  AST 29 34  ALT 30 35  ALKPHOS 117 97  BILITOT 0.3 1.5*  PROT 7.1 7.5  ALBUMIN 4.0 3.9     INFECTIOUS Recent Labs  Lab 08/10/20 1951 08/11/20 0523  LATICACIDVEN 2.2*  --   PROCALCITON <0.10 <0.10     ENDOCRINE CBG (last 3)  Recent Labs    08/10/20 1629 08/10/20 2204 08/11/20 0729  GLUCAP 165* 250* 243*         IMAGING x48h  - image(s) personally visualized  -   highlighted in bold DG CHEST PORT  1 VIEW  Result Date: 08/10/2020 CLINICAL DATA:  Respiratory distress. EXAM: PORTABLE CHEST 1 VIEW COMPARISON:  08/07/2020 FINDINGS: 1801 hours. Subtle airspace disease noted right base new since prior. Fat pad noted left base as seen on CT chest 03/08/2020. The cardiopericardial silhouette is within normal limits for size. The visualized bony structures of the thorax show no acute abnormality. Telemetry leads overlie the chest. IMPRESSION: New subtle opacity at the right base compatible with atelectasis or pneumonia. Electronically Signed   By: Misty Stanley M.D.   On: 08/10/2020 18:18     Resolved Hospital Problem list   x  Assessment & Plan:  Baseline   - Gold stage 3-4 copd with chronic hypoxemic/hypercapnic failure; 3L [place] - Obesity -  Anxiety   Acute on Chronic Hypoxemic Respiratory failure du e to AECOPD +/- acute on chronic diast CHF   -Increasingly appearing that associated vocal cord dysfunction is playing a role in failure to improve.  Did not use BiPAP yesterday. -Off atenolol since 08/10/2020   Plan  - Start BiPAP QHS daytime on and off needed - check VBG (refused ABG) - bronnchodilatation frequency  - steroids  - lasix as needed -Await echo  -Await RVP -Stepdown -Intubate if worse -Control anxiety -Can consider morphine for dyspnea relief if needed  Best practice (right click and "Reselect all SmartList Selections" daily)  Per triald      ATTESTATION & SIGNATURE   The patient Amylia Collazos is critically ill with multiple organ systems failure and requires high complexity decision making for assessment and support, frequent evaluation and titration of therapies, application of advanced monitoring technologies and extensive interpretation of multiple databases.   Critical Care Time devoted to patient care services described in this note is  35  Minutes. This time reflects time of care of this signee Dr Brand Males. This critical care time does not reflect procedure time, or teaching time or supervisory time of PA/NP/Med student/Med Resident etc but could involve care discussion time     Dr. Brand Males, M.D., [facility].C.P Pulmonary and Critical Care Medicine Staff Physician [place] [facility] Pager: [phone], If no answer or between  15:00h - 7:00h: call [phone]  08/11/2020 11:50 AM

## 2020-08-12 DIAGNOSIS — J441 Chronic obstructive pulmonary disease with (acute) exacerbation: Secondary | ICD-10-CM | POA: Diagnosis not present

## 2020-08-12 LAB — CBC
HCT: 42.3 % (ref 36.0–46.0)
Hemoglobin: 13.6 g/dL (ref 12.0–15.0)
MCH: 30.3 pg (ref 26.0–34.0)
MCHC: 32.2 g/dL (ref 30.0–36.0)
MCV: 94.2 fL (ref 80.0–100.0)
Platelets: 278 10*3/uL (ref 150–400)
RBC: 4.49 MIL/uL (ref 3.87–5.11)
RDW: 14.6 % (ref 11.5–15.5)
WBC: 15.1 10*3/uL — ABNORMAL HIGH (ref 4.0–10.5)
nRBC: 0 % (ref 0.0–0.2)

## 2020-08-12 LAB — BASIC METABOLIC PANEL
Anion gap: 10 (ref 5–15)
BUN: 25 mg/dL — ABNORMAL HIGH (ref 6–20)
CO2: 31 mmol/L (ref 22–32)
Calcium: 9.1 mg/dL (ref 8.9–10.3)
Chloride: 96 mmol/L — ABNORMAL LOW (ref 98–111)
Creatinine, Ser: 0.92 mg/dL (ref 0.44–1.00)
GFR, Estimated: >60 mL/min (ref >60)
Glucose, Bld: 173 mg/dL — ABNORMAL HIGH (ref 70–99)
Potassium: 3.8 mmol/L (ref 3.5–5.1)
Sodium: 137 mmol/L (ref 135–145)

## 2020-08-12 LAB — BLOOD GAS, ARTERIAL
Acid-Base Excess: 5.3 mmol/L — ABNORMAL HIGH (ref 0.0–2.0)
Bicarbonate: 29.4 mmol/L — ABNORMAL HIGH (ref 20.0–28.0)
O2 Saturation: 95.4 %
Patient temperature: 97.6
pCO2 arterial: 41.5 mmHg (ref 32.0–48.0)
pH, Arterial: 7.462 — ABNORMAL HIGH (ref 7.350–7.450)
pO2, Arterial: 79.7 mmHg — ABNORMAL LOW (ref 83.0–108.0)

## 2020-08-12 LAB — GLUCOSE, CAPILLARY
Glucose-Capillary: 208 mg/dL — ABNORMAL HIGH (ref 70–99)
Glucose-Capillary: 199 mg/dL — ABNORMAL HIGH (ref 70–99)
Glucose-Capillary: 179 mg/dL — ABNORMAL HIGH (ref 70–99)
Glucose-Capillary: 238 mg/dL — ABNORMAL HIGH (ref 70–99)

## 2020-08-12 LAB — RESPIRATORY PANEL BY PCR

## 2020-08-12 LAB — RAPID URINE DRUG SCREEN, HOSP PERFORMED
Amphetamines: NOT DETECTED
Barbiturates: NOT DETECTED
Benzodiazepines: POSITIVE — AB
Cocaine: NOT DETECTED
Opiates: NOT DETECTED
Tetrahydrocannabinol: NOT DETECTED

## 2020-08-12 LAB — VITAMIN B12: Vitamin B-12: 408 pg/mL (ref 180–914)

## 2020-08-12 MED ORDER — WITCH HAZEL-GLYCERIN EX PADS
MEDICATED_PAD | CUTANEOUS | Status: DC | PRN
  Administered 2020-08-17: 1 via TOPICAL
  Filled 2020-08-12 (×3): qty 100

## 2020-08-12 MED ORDER — POTASSIUM CHLORIDE CRYS ER 20 MEQ PO TBCR
40.0000 meq | EXTENDED_RELEASE_TABLET | Freq: Once | ORAL | Status: AC
  Administered 2020-08-12: 40 meq via ORAL
  Filled 2020-08-12: qty 2

## 2020-08-12 MED ORDER — HYDRALAZINE HCL 20 MG/ML IJ SOLN
5.0000 mg | Freq: Four times a day (QID) | INTRAMUSCULAR | Status: DC | PRN
  Administered 2020-08-12: 5 mg via INTRAVENOUS
  Filled 2020-08-12: qty 1

## 2020-08-12 MED ORDER — HYDRALAZINE HCL 25 MG PO TABS
25.0000 mg | ORAL_TABLET | Freq: Three times a day (TID) | ORAL | Status: DC
  Administered 2020-08-12 – 2020-08-13 (×4): 25 mg via ORAL
  Filled 2020-08-12 (×4): qty 1

## 2020-08-12 MED ORDER — SODIUM CHLORIDE 0.9 % IV SOLN: INTRAVENOUS | Status: DC | PRN

## 2020-08-12 NOTE — Progress Notes (Addendum)
NAME:  Joan [name], MRN:  [id number], DOB:  [birth year], LOS: 5 ADMISSION DATE:  08/07/2020, CONSULTATION DATE:  08/10/20 REFERRING MD:  DR Tyrell Antonio, CHIEF COMPLAINT:  Acute COPD exacerbation -slow to resolve   BRIEF:   60 year old female with chronic hypercapnia as of 2018 but uses CPAP for sleep apnea, polypharmacy, Gold stage III bordering on Gold stage IV COPD [see PFTs below], polypharmacy, anxiety and multiple medical problems including PTSD, bipolar disorder [refuses ABG].  In 2018 noted to have diastolic dysfunction presented with classic symptoms of COPD exacerbation.  Has been on home atenolol, 4 times daily bronchodilator and Solu-Medrol 60 mg twice daily.  However she has not improved had acute episode of dyspnea couple of times today including at the time of CCM evaluation around 5 PM/5:30 PM 08/10/2020.  She is received 1 dose of Lasix since admission today.  Noted to have some pedal edema.  CCM called because of respiratory distress.  She got a dose of nebulizer and seemed to improve.  She was refusing blood gas.  Her COVID PCR and flu PCR negative but does not have multiplex respiratory virus panel PCR  Pertinent  Medical History  Results for Joan [name] (MRN [id number]) as of 08/10/2020 18:25    Ref. Range 09/24/2016 15:48  pH, Ven Latest Ref Range: 7.250 - 7.430  7.428  pCO2, Ven Latest Ref Range: 44.0 - 60.0 mmHg 52.7  pO2, Ven Latest Ref Range: 32.0 - 45.0 mmHg 46.0 (H)   August 2021 blood IgE and blood allergy test panel negative with normal alpha-1 antitrypsin level in 2018  PFT Results Latest Ref Rng & Units 03/17/2019 10/30/2016  FVC-Pre L 2.35 2.16  FVC-Predicted Pre % 64 58  FVC-Post L 2.66 2.53  FVC-Predicted Post % 73 68  Pre FEV1/FVC % % 45 49  Post FEV1/FCV % % 47 49  FEV1-Pre L 1.06 1.05  FEV1-Predicted Pre % 37 36  FEV1-Post L 1.24 1.23  DLCO uncorrected ml/min/mmHg 9.80 9.40  DLCO UNC% % 44 34  DLCO corrected ml/min/mmHg - 9.52  DLCO COR  %Predicted % - 35  DLVA Predicted % 54 42  TLC L 5.72 5.36  TLC % Predicted % 106 100  RV % Predicted % 146 147    has a past medical history of Acute respiratory failure with hypoxia ([place]), Alcoholism and drug addiction in family, Anxiety, Appendicitis, Arthritis, Asthma, Bipolar disorder ([place]), Chronic bronchitis (HCC), Chronic leg pain, Chronic lower back pain, COPD (chronic obstructive pulmonary disease) ([place]), Depression, Diabetes mellitus without complication ([place]), Diabetic neuropathy ([place]), Early cataracts, bilateral, Emphysema of lung ([place]), Generalized anxiety disorder (02/09/2020), GERD (gastroesophageal reflux disease), Headache, Hepatitis C, High cholesterol, Hypertension, Hypothyroidism, Incarcerated ventral hernia (04/04/2017), Mood disorder in conditions classified elsewhere (02/09/2020), On home oxygen therapy, OSA on CPAP, Paranoid ([place]), Pneumonia, Positive PPD, PTSD (post-traumatic stress disorder) (02/09/2020), Tachycardia, Thyroid disease, Wears glasses, and Wears partial dentures.   has a past surgical history that includes Foot surgery (Bilateral); Upper gi endoscopy; Colonoscopy; Hernia repair; Umbilical hernia repair (~ 2013); Incision and drainage (~ 2014/2015); Lipoma excision (Right); Posterior lumbar fusion (2015/2016 X 2); Back surgery; Laceration repair (Right); Nasal septum surgery; Cyst excision; Tubal ligation; Dilation and curettage of uterus; Cardiac catheterization; Total hip arthroplasty (Right, 02/05/2017); Laparoscopic incisional / umbilical / ventral hernia repair (04/04/2017); Incisional hernia repair (N/A, 04/04/2017); Total hip arthroplasty (Left, 06/18/2017); Colonoscopy with propofol (N/A, 11/24/2018); Esophagogastroduodenoscopy (egd) with propofol (N/A, 11/24/2018); biopsy (11/24/2018); Total hip revision (Left, 01/26/2019); laparoscopic appendectomy (N/A,  03/31/2019); laparoscopic appendectomy (N/A, 07/09/2019); and Sinoscopy.    Significant Hospital  Events: Including procedures, antibiotic start and stop dates in addition to other pertinent events   08/07/2020 - admit 6/176 - ccm consult 6/17 -moved to sdu - refusing arterial blood gas.  VBG yesterday did not show carbadox retention.  She will use CPAP.  This morning's apparently she was lethargic after getting her usual psych and anxiety meds and she was moved to the ICU.  Currently on 3 to 5 L nasal cannula saturating well even when she exerts but having significant dyspnea.  Has waxing and waning hoarse voice although there is no thrush.  She is complains of class IV dyspnea but nursing reports that she was able to transfer out of the bed without any desaturations.  Appears quite anxious  Interim History / Subjective:    08/12/2020- better. Agreed to do ABG to [facility] but for CCM MD is demanding xanax and other medications for ABG.  Rational for ABG: BiPAP QHS at home at dc. [facility] plans floor transfer.  ECHO normal. On 3L [place]  Still pending - RVP, UDS, urine , urine strep    Objective   Blood pressure (!) 167/94, pulse 75, temperature 97.6 F (36.4 C), temperature source Oral, resp. rate 15, height 5\' 8"  (1.727 m), weight 108.9 kg, SpO2 97 %.    FiO2 (%):  [30 %] 30 %  No intake or output data in the 24 hours ending 08/12/20 0941  Filed Weights   08/07/20 1850  Weight: 108.9 kg    Examination: General Appearance:  Obese, Sitting in chair. O2 on 3L [place] Head:  Normocephalic, without obvious abnormality, atraumatic Eyes:  PERRL - yes, conjunctiva/corneas - mudd     Ears:  Normal external ear canals, both ears Nose:  G tube - no but has [place] Throat:  ETT TUBE - no , OG tube - no Neck:  Supple,  No enlargement/tenderness/nodules Lungs: Distant wheeze +, Varies, SQUEAKY VOICE Heart:  S1 and S2 normal, no murmur, CVP - no.  Pressors - no Abdomen:  Soft, no masses, no organomegaly Genitalia / Rectal:  Not done Extremities:  Extremities- intac Skin:  ntact in exposed areas . Sacral  area - not examined Neurologic:  Sedation - none -> RASS - +11 . Moves all 4s - yes. CAM-ICU - neeg . Orientation - x3+     Labs/imaging that I havepersonally reviewed  (right click and "Reselect all SmartList Selections" daily)     LABS    PULMONARY Recent Labs  Lab 08/10/20 1951 08/11/20 1109  HCO3 31.3* 31.5*  O2SAT 99.1 89.0    CBC Recent Labs  Lab 08/09/20 0537 08/10/20 0508 08/12/20 0843  HGB 13.5 11.6* 13.6  HCT 42.2 35.8* 42.3  WBC 21.1* 13.2* 15.1*  PLT 244 245 278    COAGULATION No results for input(s): INR in the last 168 hours.  CARDIAC  No results for input(s): TROPONINI in the last 168 hours. No results for input(s): PROBNP in the last 168 hours.   CHEMISTRY Recent Labs  Lab 08/09/20 0537 08/09/20 1040 08/10/20 1820 08/11/20 0523 08/12/20 0843  NA 136 136 134* 136 137  K 5.9* 4.0 4.4 4.4 3.8  CL 100 98 89* 94* 96*  CO2 27 29 32 32 31  GLUCOSE 165* 226* 214* 238* 173*  BUN 17 19 21* 25* 25*  CREATININE 1.17* 0.96 0.96 1.03* 0.92  CALCIUM 9.4 9.1 9.7 9.3 9.1   Estimated Creatinine Clearance: 86.2  mL/min (by C-G formula based on SCr of 0.92 mg/dL).   LIVER Recent Labs  Lab 08/07/20 1943 08/09/20 0537  AST 29 34  ALT 30 35  ALKPHOS 117 97  BILITOT 0.3 1.5*  PROT 7.1 7.5  ALBUMIN 4.0 3.9     INFECTIOUS Recent Labs  Lab 08/10/20 1951 08/11/20 0523  LATICACIDVEN 2.2*  --   PROCALCITON <0.10 <0.10     ENDOCRINE CBG (last 3)  Recent Labs    08/11/20 1626 08/11/20 2200 08/12/20 0749  GLUCAP 160* 293* 208*         IMAGING x48h  - image(s) personally visualized  -   highlighted in bold DG CHEST PORT 1 VIEW  Result Date: 08/10/2020 CLINICAL DATA:  Respiratory distress. EXAM: PORTABLE CHEST 1 VIEW COMPARISON:  08/07/2020 FINDINGS: 1801 hours. Subtle airspace disease noted right base new since prior. Fat pad noted left base as seen on CT chest 03/08/2020. The cardiopericardial silhouette is within normal limits  for size. The visualized bony structures of the thorax show no acute abnormality. Telemetry leads overlie the chest. IMPRESSION: New subtle opacity at the right base compatible with atelectasis or pneumonia. Electronically Signed   By: Misty Stanley M.D.   On: 08/10/2020 18:18   ECHOCARDIOGRAM COMPLETE  Result Date: 08/11/2020    ECHOCARDIOGRAM REPORT   Patient Name:   Joan [name] Date of Exam: 08/11/2020 Medical Rec #:  [id number]   Height:       68.0 in Accession #:    [id number]  Weight:       240.0 lb Date of Birth:  08-Sep-1960    BSA:          2.208 m Patient Age:    59 years    BP:           152/82 mmHg Patient Gender: F           HR:           77 bpm. Exam Location:  Inpatient Procedure: 2D Echo, Cardiac Doppler and Color Doppler Indications:    Dyspnea R06.00  History:        Patient has prior history of Echocardiogram examinations, most                 recent 09/27/2016. COPD; Risk Factors:Hypertension, Diabetes,                 Dyslipidemia, Sleep Apnea and GERD. Hypothyroidism. Alcoholism.  Sonographer:    Jonelle Sidle Dance Referring Phys: [id number] BELKYS A REGALADO IMPRESSIONS  1. Left ventricular ejection fraction, by estimation, is 60 to 65%. The left ventricle has normal function. The left ventricle has no regional wall motion abnormalities. Left ventricular diastolic parameters were normal.  2. Right ventricular systolic function is normal. The right ventricular size is normal.  3. The mitral valve is grossly normal. No evidence of mitral valve regurgitation.  4. The aortic valve is grossly normal. Aortic valve regurgitation is not visualized. No aortic stenosis is present. FINDINGS  Left Ventricle: Left ventricular ejection fraction, by estimation, is 60 to 65%. The left ventricle has normal function. The left ventricle has no regional wall motion abnormalities. The left ventricular internal cavity size was normal in size. There is  no left ventricular hypertrophy. Left ventricular diastolic parameters were  normal. Right Ventricle: The right ventricular size is normal. Right vetricular wall thickness was not well visualized. Right ventricular systolic function is normal. Left Atrium: Left atrial size was normal in size. Right Atrium: Right  atrial size was normal in size. Pericardium: There is no evidence of pericardial effusion. Mitral Valve: The mitral valve is grossly normal. No evidence of mitral valve regurgitation. Tricuspid Valve: The tricuspid valve is grossly normal. Tricuspid valve regurgitation is trivial. Aortic Valve: The aortic valve is grossly normal. Aortic valve regurgitation is not visualized. No aortic stenosis is present. Pulmonic Valve: The pulmonic valve was grossly normal. Pulmonic valve regurgitation is not visualized. Aorta: The aortic root and ascending aorta are structurally normal, with no evidence of dilitation. IAS/Shunts: The atrial septum is grossly normal.  LEFT VENTRICLE PLAX 2D LVIDd:         4.40 cm Diastology LVIDs:         2.90 cm LV e' medial:    8.16 cm/s LV PW:         1.20 cm LV E/e' medial:  11.9 LV IVS:        0.80 cm LV e' lateral:   10.60 cm/s                        LV E/e' lateral: 9.1  RIGHT VENTRICLE             IVC RV Basal diam:  2.90 cm     IVC diam: 2.30 cm RV S prime:     15.30 cm/s TAPSE (M-mode): 2.6 cm LEFT ATRIUM             Index       RIGHT ATRIUM           Index LA diam:        2.80 cm 1.27 cm/m  RA Area:     13.40 cm LA Vol (A2C):   55.1 ml 24.95 ml/m RA Volume:   26.30 ml  11.91 ml/m LA Vol (A4C):   38.6 ml 17.48 ml/m LA Biplane Vol: 46.8 ml 21.19 ml/m  AORTIC VALVE LVOT Vmax:   100.60 cm/s LVOT Vmean:  62.750 cm/s LVOT VTI:    0.198 m  AORTA Ao Asc diam: 3.40 cm MITRAL VALVE MV Area (PHT): 3.85 cm    SHUNTS MV Decel Time: 197 msec    Systemic VTI: 0.20 m MV E velocity: 96.70 cm/s MV A velocity: 92.40 cm/s MV E/A ratio:  1.05 Mertie Moores MD Electronically signed by Mertie Moores MD Signature Date/Time: 08/11/2020/5:13:58 PM    Final      Resolved  Hospital Problem list   x  Assessment & Plan:  Baseline   - Gold stage 3-4 copd with chronic hypoxemic/hypercapnic failure; 3L [place] - Obesity -  Anxiety - Poylphmarcy   Acute on Chronic Hypoxemic Respiratory failure du e to AECOPD   -Increasingly appearing that associated vocal cord dysfunction is playing a role in failure to improve.  Off atenolol since 08/10/2020 - reluctant to get ABG due to fear of pain -RVP pending  08/12/2020  " subjectivley better . Objectively some better with wheeze but still wheeze +. VCD top ddx. Unclear if she used BiPAP v cPAP last night   New Hypokalemia 08/12/20  Plan - replete K  - BiPAP QHS daytime on and off needed - ABG if she will allow - ensure RVP, uds, urine strep and urine leg are checkd - reordeerd 08/12/20 - bronnchodilatation frequency  - steroids -Control anxiety per [facility] -Can consider morphine for dyspnea relief if needed per [facility] - ok to move out of SDU  Call CCM during week to help set up opd followup  Best practice (right click and "Reselect all SmartList Selections" daily)  Per triald      ATTESTATION & SIGNATURE    Dr. Brand Males, M.D., [facility].C.P Pulmonary and Critical Care Medicine Staff Physician [place] [facility] Pager: [phone], If no answer or between  15:00h - 7:00h: call [phone]  08/12/2020 9:41 AM

## 2020-08-12 NOTE — Evaluation (Signed)
Occupational Therapy Evaluation Patient Details Name: Joan [name] MRN: [id number] DOB: 1960/06/24 Today's Date: 08/12/2020    History of Present Illness Patient is 60 y.o. female admitted on 08/07/2020 with COPD exacerbation with PMH significant for HTN, hypothyroidism, GERD, COPD, DM, chronic pain, anxiety, depression, PTSD, bipolar d/o, smoker, Lt THR on 01/26/19, s/p laproscopic appendectomy on 03/31/19 and OA.   Clinical Impression   Patient is currently requiring assistance with ADLs including moderate to maximum assist with toileting, LE dressing, and with LB bathing, and minimal assist to supervision/setup with seated grooming UB bathing, and UE dressing, all of which is below patient's typical baseline however pt describes a gradual increase in difficulty with self care and functional mobility at home.    During this evaluation, patient was limited by poor activity tolerance using her baseline 3L supplemental O2, posterior neck and upper back pain, tinging and numbness to BUEs (chronic) as well as mild confusion "where are we?" with some decreased attention to topic, all of which has the potential to impact patient's safety and independence during functional mobility, as well as performance for ADLs. [employer] AM-PAC "6-clicks" Daily Activity Inpatient Short Form score of 16/24 indicates 53.32% ADL impairment this session. Patient lives with her brother, who is able to provide supervision and assistance only on Saturdays/Sundays and Mondays which pt reports is no longer sufficient for her needs.  Patient demonstrates fair rehab potential, and should benefit from continued skilled occupational therapy services while in acute care to maximize safety, independence and quality of life at home.  Continued occupational therapy services in a SNF setting prior to return home is recommended.  ?    Follow Up Recommendations  SNF    Equipment Recommendations   (Will defer to post-acute  recommendations.)    Recommendations for Other Services       Precautions / Restrictions Precautions Precautions: Fall Restrictions Weight Bearing Restrictions: No      Mobility Bed Mobility Overal bed mobility: Needs Assistance Bed Mobility: Supine to Sit     Supine to sit: HOB elevated;Modified independent (Device/Increase time)          Transfers Overall transfer level: Needs assistance Equipment used: Rolling walker (2 wheeled) Transfers: Sit to/from [employer] Sit to Stand: Min assist Stand pivot transfers: Min assist            Balance Overall balance assessment: Needs assistance;History of Falls (Pt endorses a fall "sometime recently") Sitting-balance support: Feet unsupported Sitting balance-Leahy Scale: Good Sitting balance - Comments: Good static at EOB     Standing balance-Leahy Scale: Poor Standing balance comment: Dependent on BUE support.                           ADL either performed or assessed with clinical judgement   ADL Overall ADL's : Needs assistance/impaired Eating/Feeding: Independent   Grooming: Supervision/safety;Set up;Sitting;Wash/dry hands   Upper Body Bathing: Min guard;Sitting   Lower Body Bathing: Moderate assistance;Sitting/lateral leans;Sit to/from stand   Upper Body Dressing : Minimal assistance;Sitting   Lower Body Dressing: Sitting/lateral leans;Sit to/from stand;Maximal assistance   Toilet Transfer: BSC;RW;Stand-pivot;Min Pension scheme manager Details (indicate cue type and reason): Simulated with recliner. Toileting- Clothing Manipulation and Hygiene: Moderate assistance     Tub/Shower Transfer Details (indicate cue type and reason): Pt uses tub bench at home. Functional mobility during ADLs: Minimal assistance;Min guard;Rolling walker       Vision Baseline Vision/History: Wears glasses Wears Glasses: At  all times Vision Assessment?: No apparent visual deficits     Perception      Praxis      Pertinent Vitals/Pain Pain Assessment: 0-10 Pain Score: 6  Pain Location: Posterior neck/upper back Pain Intervention(s): Limited activity within patient's tolerance;Monitored during session;Repositioned     Hand Dominance Right   Extremity/Trunk Assessment Upper Extremity Assessment Upper Extremity Assessment: Generalized weakness (NT proximally due to upper back pain.)   Lower Extremity Assessment Lower Extremity Assessment: Defer to PT evaluation       Communication     Cognition Arousal/Alertness: Awake/alert Behavior During Therapy: WFL for tasks assessed/performed Overall Cognitive Status: No family/caregiver present to determine baseline cognitive functioning                                 General Comments: Generally orieted but asked, "Where are we?" when giving directions to a family member on the phone.   General Comments       Exercises     Shoulder Instructions      Home Living Family/patient expects to be discharged to:: Skilled nursing facility Living Arrangements: Other relatives Available Help at Discharge: Family;Available PRN/intermittently               Bathroom Shower/Tub: Tub/shower unit;Curtain   Bathroom Toilet: Standard     Home Equipment: Hand held shower head;Bedside commode;Walker - standard;Walker - 4 wheels;Walker - 2 wheels;Cane - single point;Tub bench   Additional Comments: Home O2, non-slip bath mat      Prior Functioning/Environment Level of Independence: Needs assistance  Gait / Transfers Assistance Needed: Pt ambulates limited distances with RW in home and cane in community. ADL's / Homemaking Assistance Needed: Pt reports that she is no longer able to perform her own bathing, and does not have adequate assistance at home. Reporat that her brother is only available Sat/Sun/Mon to assist.            OT Problem List: Pain;Cardiopulmonary status limiting activity;Decreased  strength;Impaired sensation;Decreased activity tolerance;Decreased safety awareness;Impaired balance (sitting and/or standing);Decreased knowledge of use of DME or AE;Decreased knowledge of precautions      OT Treatment/Interventions: Self-care/ADL training;Therapeutic exercise;Therapeutic activities;Cognitive remediation/compensation;Energy conservation;Patient/family education;DME and/or AE instruction;Balance training    OT Goals(Current goals can be found in the care plan section) Acute Rehab OT Goals Patient Stated Goal: More help at home. OT Goal Formulation: With patient Time For Goal Achievement: 08/26/20 Potential to Achieve Goals: Fair ADL Goals Pt Will Perform Upper Body Bathing: sitting;with modified independence;with adaptive equipment Pt Will Perform Lower Body Bathing: with modified independence;with adaptive equipment;sitting/lateral leans Pt Will Perform Lower Body Dressing: with set-up;with adaptive equipment;sitting/lateral leans;sit to/from stand Pt Will Transfer to Toilet: with modified independence;bedside commode;stand pivot transfer Pt Will Perform Toileting - Clothing Manipulation and hygiene: with modified independence;with adaptive equipment;sitting/lateral leans Additional ADL Goal #1: Pt will engage in 8+ min combined sitting and standing functional activities without loss of balance, in order to demonstrate improved activity tolerance and balance needed to perform ADLs safely at home. Additional ADL Goal #2: Patient will identify at least 3 energy conservation strategies to employ at home in order to maximize function and quality of life and decrease caregiver burden while preventing exacerbation of symptoms and rehospitalization.  OT Frequency: Min 2X/week   Barriers to D/C: Decreased caregiver support  Pt reports current inability to care for herself at home.       Co-evaluation  AM-PAC OT "6 Clicks" Daily Activity     Outcome Measure Help  from another person eating meals?: None Help from another person taking care of personal grooming?: A Little Help from another person toileting, which includes using toliet, bedpan, or urinal?: A Lot Help from another person bathing (including washing, rinsing, drying)?: A Lot Help from another person to put on and taking off regular upper body clothing?: A Little Help from another person to put on and taking off regular lower body clothing?: A Lot 6 Click Score: 16   End of Session Equipment Utilized During Treatment: Rolling walker Nurse Communication: Mobility status  Activity Tolerance: Patient limited by fatigue Patient left: in chair;with call bell/phone within reach;with chair alarm set  OT Visit Diagnosis: Unsteadiness on feet (R26.81);Pain (Z73.6-decreased ADLs) Pain - part of body:  (posterior neck/upper back)                Time: 0630-1601 OT Time Calculation (min): 33 min Charges:  OT General Charges $OT Visit: 1 Visit OT Evaluation $OT Eval Moderate Complexity: 1 Mod  Mykira Hofmeister, OT Acute Rehab Services Office: [phone] 08/12/2020  Theodoro Clock 08/12/2020, 12:26 PM

## 2020-08-12 NOTE — Evaluation (Signed)
Physical Therapy Evaluation Patient Details Name: Joan [name] MRN: [id number] DOB: October 26, 1960 Today's Date: 08/12/2020   History of Present Illness  Patient is 60 y.o. female admitted on 08/07/2020 with COPD exacerbation with PMH significant for HTN, hypothyroidism, GERD, COPD, DM, chronic pain, anxiety, depression, PTSD, bipolar d/o, smoker, Lt THR on 01/26/19, s/p laproscopic appendectomy on 03/31/19 and OA.  Clinical Impression  Pt admitted as above and presenting with functional mobility limitations 2* generalized weakness, ambulatory balance deficits, and very limited endurance.  Pt would benefit from follow up rehab at SNF level to maximize IND and safety prior to dc home with limited assist.    Follow Up Recommendations SNF    Equipment Recommendations  None recommended by PT    Recommendations for Other Services       Precautions / Restrictions Precautions Precautions: Fall Restrictions Weight Bearing Restrictions: No      Mobility  Bed Mobility Overal bed mobility: Needs Assistance Bed Mobility: Supine to Sit     Supine to sit: HOB elevated;Modified independent (Device/Increase time)          Transfers Overall transfer level: Needs assistance Equipment used: Rolling walker (2 wheeled) Transfers: Sit to/from [employer] Sit to Stand: Min assist Stand pivot transfers: Min assist          Ambulation/Gait Ambulation/Gait assistance: Min assist Gait Distance (Feet): 32 Feet Assistive device: Rolling walker (2 wheeled) Gait Pattern/deviations: Step-to pattern;Decreased step length - right;Decreased step length - left;Shuffle;Trunk flexed Gait velocity: decr   General Gait Details: Increased time with multiple short standing rest breaks and cues for posture and position from RW.  Stairs            Wheelchair Mobility    Modified Rankin (Stroke Patients Only)       Balance Overall balance assessment: Needs assistance;History  of Falls Sitting-balance support: Feet unsupported Sitting balance-Leahy Scale: Good Sitting balance - Comments: Good static at EOB   Standing balance support: Bilateral upper extremity supported Standing balance-Leahy Scale: Poor Standing balance comment: Dependent on BUE support.                             Pertinent Vitals/Pain Pain Assessment: 0-10 Pain Score: 6  Pain Location: Posterior neck/upper back Pain Descriptors / Indicators: Aching Pain Intervention(s): Limited activity within patient's tolerance;Monitored during session    Home Living Family/patient expects to be discharged to:: Skilled nursing facility Living Arrangements: Other relatives Available Help at Discharge: Family;Available PRN/intermittently           Home Equipment: Hand held shower head;Bedside commode;Walker - standard;Walker - 4 wheels;Walker - 2 wheels;Cane - single point;Tub bench Additional Comments: Home O2, non-slip bath mat    Prior Function Level of Independence: Needs assistance   Gait / Transfers Assistance Needed: Pt ambulates limited distances with RW in home and cane in community.  ADL's / Homemaking Assistance Needed: Pt reports that she is no longer able to perform her own bathing, and does not have adequate assistance at home. Reporat that her brother is only available Sat/Sun/Mon to assist.        Hand Dominance   Dominant Hand: Right    Extremity/Trunk Assessment   Upper Extremity Assessment Upper Extremity Assessment: Generalized weakness    Lower Extremity Assessment Lower Extremity Assessment: Generalized weakness       Communication   Communication: No difficulties  Cognition Arousal/Alertness: Awake/alert Behavior During Therapy: WFL for tasks assessed/performed Overall  Cognitive Status: No family/caregiver present to determine baseline cognitive functioning                                 General Comments: Generally orieted but  asked, "Where are we?" when giving directions to a family member on the phone.      General Comments      Exercises     Assessment/Plan    PT Assessment Patient needs continued PT services  PT Problem List Decreased strength;Decreased activity tolerance;Decreased balance;Decreased mobility;Decreased knowledge of use of DME;Obesity;Pain       PT Treatment Interventions DME instruction;Gait training;Functional mobility training;Therapeutic activities;Therapeutic exercise;Balance training;Patient/family education    PT Goals (Current goals can be found in the Care Plan section)  Acute Rehab PT Goals Patient Stated Goal: More help at home. PT Goal Formulation: With patient Time For Goal Achievement: 08/25/20 Potential to Achieve Goals: Fair    Frequency Min 3X/week   Barriers to discharge Decreased caregiver support Pt has min assist at home    Co-evaluation PT/OT/SLP Co-Evaluation/Treatment: Yes Reason for Co-Treatment: For patient/therapist safety;To address functional/ADL transfers           AM-PAC PT "6 Clicks" Mobility  Outcome Measure Help needed turning from your back to your side while in a flat bed without using bedrails?: A Little Help needed moving from lying on your back to sitting on the side of a flat bed without using bedrails?: A Little Help needed moving to and from a bed to a chair (including a wheelchair)?: A Little Help needed standing up from a chair using your arms (e.g., wheelchair or bedside chair)?: A Little Help needed to walk in hospital room?: A Little Help needed climbing 3-5 steps with a railing? : A Lot 6 Click Score: 17    End of Session Equipment Utilized During Treatment: Gait belt;Oxygen Activity Tolerance: Patient limited by fatigue Patient left: in chair;with call bell/phone within reach;with chair alarm set Nurse Communication: Mobility status PT Visit Diagnosis: Unsteadiness on feet (R26.81);Muscle weakness (generalized)  (M62.81);Difficulty in walking, not elsewhere classified (R26.2)    Time: 1610-9604 PT Time Calculation (min) (ACUTE ONLY): 33 min   Charges:   PT Evaluation $PT Eval Moderate Complexity: 1 Mod          Mauro Kaufmann PT Acute Rehabilitation Services Pager [phone] Office [phone]   Seema Blum 08/12/2020, 12:52 PM

## 2020-08-12 NOTE — Progress Notes (Signed)
PROGRESS NOTE    Joan [name]  WUJ:[id number] DOB: Apr 14, 1960 DOA: 08/07/2020 PCP: Myrlene Broker, MD   Brief Narrative:  60 year old with past medical history significant for COPD, bipolar disorder, PTSD, chronic respiratory failure on home oxygen 3 L, who presented to the ED complaining of worsening shortness of breath associated with cough, wheezing.  She is a current smoker 1 pack/day. Chest x-ray on presentation did not show any acute pulmonary findings.  Patient was admitted for management of COPD exacerbation.   Assessment & Plan:   Active Problems:   COPD exacerbation (HCC)  1-Acute on Chronic Hypoxic Respiratory Failure: Secondary to COPD exacerbation -Currently on 3 L home oxygen -Continue with IV steroids, bronchodilators, incentive spirometry. -Completed azithromycin. -Continue with guaifenesin. -ECHO; normal.  Patient was more lethargic  6/17 , transfer to progressive unit for closer monitor. Chest x ray with opacity vs atelectasis. Added  Unasyn.  Appreciate Pulmonary evaluation.  Doing better this morning, plan to move her to progressive care.  Follow respiratory panel.   2-Tobacco use: Counseling provided.  3-Bacteria vaginosis: Continue with flagyl for 7 days.  Continue with Clotrimazole to cover for Candida infection.   4-Hypothyroidism: Continue with Synthroid.  History of depression/bipolar/PTSD: Continue with Wellbutrin and Effexor.  GERD: Continue with PPI.  Hyperlipidemia; on statins.   Leukocytosis: Secondary to a steroid.  Steroid-induced hyperglycemia; SSI. On lantus.  HB A1c 6.8  AKI resolved. Sleep Apnea; CPAP/  Morbid obesity. BMI 36.4 HTN; on HCTZ, change to lasix staring tomorrow to help with LE edema.  BP elevated, increase Hydralazine.   Estimated body mass index is 36.49 kg/m as calculated from the following:   Height as of this encounter: 5\' 8"  (1.727 m).   Weight as of this encounter: 108.9 kg.   DVT  prophylaxis: Lovenox Code Status: Full code Family Communication: care discussed with patient.  Disposition Plan:  Status is: Inpatient  Remains inpatient appropriate because:IV treatments appropriate due to intensity of illness or inability to take PO  Dispo: The patient is from: Home              Anticipated d/c is to: Home PT recommend SNF, will consult SW              Patient currently is not medically stable to d/c.   Difficult to place patient No        Consultants:  none  Procedures:  ECHO  Antimicrobials:    Subjective: She is alert, speaking in full sentences.  Still with Dyspnea on exertion.   Objective: Vitals:   08/12/20 0808 08/12/20 1134 08/12/20 1135 08/12/20 1209  BP:      Pulse:   (!) 119 95  Resp:   19 (!) 21  Temp: 97.6 F (36.4 C) 97.6 F (36.4 C)    TempSrc: Oral Oral    SpO2:    95%  Weight:      Height:        Intake/Output Summary (Last 24 hours) at 08/12/2020 1336 Last data filed at 08/12/2020 1247 Gross per 24 hour  Intake 353.13 ml  Output --  Net 353.13 ml   Filed Weights   08/07/20 1850  Weight: 108.9 kg    Examination:  General exam: Alert, follows command Respiratory system: BL wheezing  Cardiovascular system: S 1, S 2 RRR Gastrointestinal system: BS present, soft, nt Central nervous system: Alert Extremities: LE edema  Data Reviewed: I have personally reviewed following labs and imaging studies  CBC: Recent  Labs  Lab 08/07/20 1943 08/08/20 0550 08/09/20 0537 08/10/20 0508 08/12/20 0843  WBC 9.3 10.8* 21.1* 13.2* 15.1*  NEUTROABS 7.4  --   --  11.5*  --   HGB 13.1 12.5 13.5 11.6* 13.6  HCT 39.8 37.1 42.2 35.8* 42.3  MCV 92.6 92.5 95.3 93.7 94.2  PLT 222 204 244 245 278   Basic Metabolic Panel: Recent Labs  Lab 08/09/20 0537 08/09/20 1040 08/10/20 1820 08/11/20 0523 08/12/20 0843  NA 136 136 134* 136 137  K 5.9* 4.0 4.4 4.4 3.8  CL 100 98 89* 94* 96*  CO2 27 29 32 32 31  GLUCOSE 165* 226* 214*  238* 173*  BUN 17 19 21* 25* 25*  CREATININE 1.17* 0.96 0.96 1.03* 0.92  CALCIUM 9.4 9.1 9.7 9.3 9.1   GFR: Estimated Creatinine Clearance: 86.2 mL/min (by C-G formula based on SCr of 0.92 mg/dL). Liver Function Tests: Recent Labs  Lab 08/07/20 1943 08/09/20 0537  AST 29 34  ALT 30 35  ALKPHOS 117 97  BILITOT 0.3 1.5*  PROT 7.1 7.5  ALBUMIN 4.0 3.9   Recent Labs  Lab 08/07/20 1943  LIPASE 32   No results for input(s): AMMONIA in the last 168 hours. Coagulation Profile: No results for input(s): INR, PROTIME in the last 168 hours. Cardiac Enzymes: No results for input(s): CKTOTAL, CKMB, CKMBINDEX, TROPONINI in the last 168 hours. BNP (last 3 results) No results for input(s): PROBNP in the last 8760 hours. HbA1C: No results for input(s): HGBA1C in the last 72 hours.  CBG: Recent Labs  Lab 08/11/20 1210 08/11/20 1626 08/11/20 2200 08/12/20 0749 08/12/20 1117  GLUCAP 163* 160* 293* 208* 199*   Lipid Profile: No results for input(s): CHOL, HDL, LDLCALC, TRIG, CHOLHDL, LDLDIRECT in the last 72 hours. Thyroid Function Tests: No results for input(s): TSH, T4TOTAL, FREET4, T3FREE, THYROIDAB in the last 72 hours. Anemia Panel: Recent Labs    08/12/20 0913  VITAMINB12 408   Sepsis Labs: Recent Labs  Lab 08/10/20 1951 08/11/20 0523  PROCALCITON <0.10 <0.10  LATICACIDVEN 2.2*  --     Recent Results (from the past 240 hour(s))  Resp Panel by RT-PCR (Flu A&B, Covid) Nasopharyngeal Swab     Status: None   Collection Time: 08/07/20  8:10 PM   Specimen: Nasopharyngeal Swab; Nasopharyngeal(NP) swabs in vial transport medium  Result Value Ref Range Status   SARS Coronavirus 2 by RT PCR NEGATIVE NEGATIVE Final    Comment: (NOTE) SARS-CoV-2 target nucleic acids are NOT DETECTED.  The SARS-CoV-2 RNA is generally detectable in upper respiratory specimens during the acute phase of infection. The lowest concentration of SARS-CoV-2 viral copies this assay can detect  is 138 copies/mL. A negative result does not preclude SARS-Cov-2 infection and should not be used as the sole basis for treatment or other patient management decisions. A negative result may occur with  improper specimen collection/handling, submission of specimen other than nasopharyngeal swab, presence of viral mutation(s) within the areas targeted by this assay, and inadequate number of viral copies(<138 copies/mL). A negative result must be combined with clinical observations, patient history, and epidemiological information. The expected result is Negative.  Fact Sheet for Patients:  BloggerCourse.com  Fact Sheet for Healthcare Providers:  SeriousBroker.it  This test is no t yet approved or cleared by the [place] FDA and  has been authorized for detection and/or diagnosis of SARS-CoV-2 by FDA under an Emergency Use Authorization (EUA). This EUA will remain  in effect (meaning this  test can be used) for the duration of the COVID-19 declaration under Section 564(b)(1) of the Act, 21 U.S.C.section 360bbb-3(b)(1), unless the authorization is terminated  or revoked sooner.       Influenza A by PCR NEGATIVE NEGATIVE Final   Influenza B by PCR NEGATIVE NEGATIVE Final    Comment: (NOTE) The Xpert Xpress SARS-CoV-2/FLU/RSV plus assay is intended as an aid in the diagnosis of influenza from Nasopharyngeal swab specimens and should not be used as a sole basis for treatment. Nasal washings and aspirates are unacceptable for Xpert Xpress SARS-CoV-2/FLU/RSV testing.  Fact Sheet for Patients: BloggerCourse.com  Fact Sheet for Healthcare Providers: SeriousBroker.it  This test is not yet approved or cleared by the [place] FDA and has been authorized for detection and/or diagnosis of SARS-CoV-2 by FDA under an Emergency Use Authorization (EUA). This EUA will remain in effect  (meaning this test can be used) for the duration of the COVID-19 declaration under Section 564(b)(1) of the Act, 21 U.S.C. section 360bbb-3(b)(1), unless the authorization is terminated or revoked.  Performed at [facility], [address], [place], [place] [address]   MRSA PCR Screening     Status: Abnormal   Collection Time: 08/11/20  9:35 AM  Result Value Ref Range Status   MRSA by PCR POSITIVE (A) NEGATIVE Final    Comment:        The GeneXpert MRSA Assay (FDA approved for NASAL specimens only), is one component of a comprehensive MRSA colonization surveillance program. It is not intended to diagnose MRSA infection nor to guide or monitor treatment for MRSA infections. CRITICAL VALUE NOTED.  VALUE IS CONSISTENT WITH PREVIOUSLY REPORTED AND CALLED VALUE. POLLET E. ON 08/11/2020 @ 1141 BY MECIAL J. Performed at [facility], [address], [place], [place] [address]           Radiology Studies: DG CHEST PORT 1 VIEW  Result Date: 08/10/2020 CLINICAL DATA:  Respiratory distress. EXAM: PORTABLE CHEST 1 VIEW COMPARISON:  08/07/2020 FINDINGS: 1801 hours. Subtle airspace disease noted right base new since prior. Fat pad noted left base as seen on CT chest 03/08/2020. The cardiopericardial silhouette is within normal limits for size. The visualized bony structures of the thorax show no acute abnormality. Telemetry leads overlie the chest. IMPRESSION: New subtle opacity at the right base compatible with atelectasis or pneumonia. Electronically Signed   By: Kennith Center M.D.   On: 08/10/2020 18:18   ECHOCARDIOGRAM COMPLETE  Result Date: 08/11/2020    ECHOCARDIOGRAM REPORT   Patient Name:   Joan [name] Date of Exam: 08/11/2020 Medical Rec #:  [id number]   Height:       68.0 in Accession #:    [id number]  Weight:       240.0 lb Date of Birth:  1960/03/28    BSA:          2.208 m Patient Age:    59 years    BP:           152/82 mmHg Patient Gender:  F           HR:           77 bpm. Exam Location:  Inpatient Procedure: 2D Echo, Cardiac Doppler and Color Doppler Indications:    Dyspnea R06.00  History:        Patient has prior history of Echocardiogram examinations, most                 recent 09/27/2016.  COPD; Risk Factors:Hypertension, Diabetes,                 Dyslipidemia, Sleep Apnea and GERD. Hypothyroidism. Alcoholism.  Sonographer:    Elmarie Shiley Dance Referring Phys: [id number] Jomel Whittlesey A Aritza Brunet IMPRESSIONS  1. Left ventricular ejection fraction, by estimation, is 60 to 65%. The left ventricle has normal function. The left ventricle has no regional wall motion abnormalities. Left ventricular diastolic parameters were normal.  2. Right ventricular systolic function is normal. The right ventricular size is normal.  3. The mitral valve is grossly normal. No evidence of mitral valve regurgitation.  4. The aortic valve is grossly normal. Aortic valve regurgitation is not visualized. No aortic stenosis is present. FINDINGS  Left Ventricle: Left ventricular ejection fraction, by estimation, is 60 to 65%. The left ventricle has normal function. The left ventricle has no regional wall motion abnormalities. The left ventricular internal cavity size was normal in size. There is  no left ventricular hypertrophy. Left ventricular diastolic parameters were normal. Right Ventricle: The right ventricular size is normal. Right vetricular wall thickness was not well visualized. Right ventricular systolic function is normal. Left Atrium: Left atrial size was normal in size. Right Atrium: Right atrial size was normal in size. Pericardium: There is no evidence of pericardial effusion. Mitral Valve: The mitral valve is grossly normal. No evidence of mitral valve regurgitation. Tricuspid Valve: The tricuspid valve is grossly normal. Tricuspid valve regurgitation is trivial. Aortic Valve: The aortic valve is grossly normal. Aortic valve regurgitation is not visualized. No aortic stenosis  is present. Pulmonic Valve: The pulmonic valve was grossly normal. Pulmonic valve regurgitation is not visualized. Aorta: The aortic root and ascending aorta are structurally normal, with no evidence of dilitation. IAS/Shunts: The atrial septum is grossly normal.  LEFT VENTRICLE PLAX 2D LVIDd:         4.40 cm Diastology LVIDs:         2.90 cm LV e' medial:    8.16 cm/s LV PW:         1.20 cm LV E/e' medial:  11.9 LV IVS:        0.80 cm LV e' lateral:   10.60 cm/s                        LV E/e' lateral: 9.1  RIGHT VENTRICLE             IVC RV Basal diam:  2.90 cm     IVC diam: 2.30 cm RV S prime:     15.30 cm/s TAPSE (M-mode): 2.6 cm LEFT ATRIUM             Index       RIGHT ATRIUM           Index LA diam:        2.80 cm 1.27 cm/m  RA Area:     13.40 cm LA Vol (A2C):   55.1 ml 24.95 ml/m RA Volume:   26.30 ml  11.91 ml/m LA Vol (A4C):   38.6 ml 17.48 ml/m LA Biplane Vol: 46.8 ml 21.19 ml/m  AORTIC VALVE LVOT Vmax:   100.60 cm/s LVOT Vmean:  62.750 cm/s LVOT VTI:    0.198 m  AORTA Ao Asc diam: 3.40 cm MITRAL VALVE MV Area (PHT): 3.85 cm    SHUNTS MV Decel Time: 197 msec    Systemic VTI: 0.20 m MV E velocity: 96.70 cm/s MV A velocity: 92.40 cm/s MV E/A ratio:  1.05 Kristeen Miss MD Electronically signed by Kristeen Miss MD Signature Date/Time: 08/11/2020/5:13:58 PM    Final         Scheduled Meds:  acyclovir  400 mg Oral BID   atorvastatin  20 mg Oral Daily   budesonide (PULMICORT) nebulizer solution  0.5 mg Nebulization BID   buPROPion  150 mg Oral Daily   chlorhexidine  15 mL Mouth Rinse BID   Chlorhexidine Gluconate Cloth  6 each Topical Daily   clotrimazole  1 Applicatorful Vaginal QHS   enoxaparin (LOVENOX) injection  40 mg Subcutaneous Q24H   furosemide  20 mg Oral Daily   guaiFENesin  1,200 mg Oral BID   hydrALAZINE  25 mg Oral Q8H   insulin aspart  0-15 Units Subcutaneous TID WC   insulin aspart  4 Units Subcutaneous TID WC   insulin glargine  10 Units Subcutaneous Daily    ipratropium-albuterol  3 mL Nebulization Q4H   levothyroxine  25 mcg Oral Q0600   mouth rinse  15 mL Mouth Rinse q12n4p   methylPREDNISolone (SOLU-MEDROL) injection  60 mg Intravenous Q6H   metroNIDAZOLE  500 mg Oral Q12H   montelukast  10 mg Oral QHS   mupirocin ointment  1 application Nasal BID   nicotine  14 mg Transdermal Daily   olopatadine  1 drop Both Eyes BID   pantoprazole  40 mg Oral Daily   pregabalin  150 mg Oral QHS   QUEtiapine  50 mg Oral QHS   venlafaxine XR  150 mg Oral Q breakfast   Continuous Infusions:  ampicillin-sulbactam (UNASYN) IV Stopped (08/12/20 1234)     LOS: 5 days    Time spent: 35 minutes.     Alba Cory, MD [facility]   If 7PM-7AM, please contact night-coverage [email or web address]  08/12/2020, 1:36 PM

## 2020-08-13 DIAGNOSIS — J441 Chronic obstructive pulmonary disease with (acute) exacerbation: Secondary | ICD-10-CM | POA: Diagnosis not present

## 2020-08-13 LAB — CBC
HCT: 38.3 % (ref 36.0–46.0)
Hemoglobin: 13.1 g/dL (ref 12.0–15.0)
MCH: 31.2 pg (ref 26.0–34.0)
MCHC: 34.2 g/dL (ref 30.0–36.0)
MCV: 91.2 fL (ref 80.0–100.0)
Platelets: 270 10*3/uL (ref 150–400)
RBC: 4.2 MIL/uL (ref 3.87–5.11)
RDW: 14.8 % (ref 11.5–15.5)
WBC: 16.5 10*3/uL — ABNORMAL HIGH (ref 4.0–10.5)
nRBC: 0 % (ref 0.0–0.2)

## 2020-08-13 LAB — GLUCOSE, CAPILLARY
Glucose-Capillary: 199 mg/dL — ABNORMAL HIGH (ref 70–99)
Glucose-Capillary: 295 mg/dL — ABNORMAL HIGH (ref 70–99)
Glucose-Capillary: 96 mg/dL (ref 70–99)
Glucose-Capillary: 173 mg/dL — ABNORMAL HIGH (ref 70–99)

## 2020-08-13 LAB — BASIC METABOLIC PANEL
Anion gap: 8 (ref 5–15)
BUN: 28 mg/dL — ABNORMAL HIGH (ref 6–20)
CO2: 28 mmol/L (ref 22–32)
Calcium: 8.8 mg/dL — ABNORMAL LOW (ref 8.9–10.3)
Chloride: 101 mmol/L (ref 98–111)
Creatinine, Ser: 0.86 mg/dL (ref 0.44–1.00)
GFR, Estimated: >60 mL/min (ref >60)
Glucose, Bld: 212 mg/dL — ABNORMAL HIGH (ref 70–99)
Potassium: 4.1 mmol/L (ref 3.5–5.1)
Sodium: 137 mmol/L (ref 135–145)

## 2020-08-13 LAB — MAGNESIUM: Magnesium: 2.2 mg/dL (ref 1.7–2.4)

## 2020-08-13 LAB — STREP PNEUMONIAE URINARY ANTIGEN: Strep Pneumo Urinary Antigen: NEGATIVE

## 2020-08-13 LAB — PHOSPHORUS: Phosphorus: 3.6 mg/dL (ref 2.5–4.6)

## 2020-08-13 MED ORDER — TRAZODONE HCL 100 MG PO TABS
100.0000 mg | ORAL_TABLET | Freq: Every evening | ORAL | Status: DC | PRN
  Administered 2020-08-13 – 2020-08-21 (×9): 100 mg via ORAL
  Filled 2020-08-13 (×9): qty 1

## 2020-08-13 MED ORDER — BISOPROLOL FUMARATE 5 MG PO TABS
5.0000 mg | ORAL_TABLET | Freq: Every day | ORAL | Status: DC
  Administered 2020-08-13 – 2020-08-22 (×10): 5 mg via ORAL
  Filled 2020-08-13 (×10): qty 1

## 2020-08-13 MED ORDER — HYDRALAZINE HCL 50 MG PO TABS
50.0000 mg | ORAL_TABLET | Freq: Three times a day (TID) | ORAL | Status: DC
  Administered 2020-08-13 – 2020-08-22 (×27): 50 mg via ORAL
  Filled 2020-08-13 (×27): qty 1

## 2020-08-13 MED ORDER — METHYLPREDNISOLONE SODIUM SUCC 125 MG IJ SOLR
60.0000 mg | Freq: Two times a day (BID) | INTRAMUSCULAR | Status: DC
  Administered 2020-08-13 – 2020-08-14 (×2): 60 mg via INTRAVENOUS
  Filled 2020-08-13 (×2): qty 2

## 2020-08-13 MED ORDER — HYDRALAZINE HCL 25 MG PO TABS
25.0000 mg | ORAL_TABLET | Freq: Once | ORAL | Status: AC
  Administered 2020-08-13: 25 mg via ORAL
  Filled 2020-08-13: qty 1

## 2020-08-13 MED ORDER — IPRATROPIUM-ALBUTEROL 0.5-2.5 (3) MG/3ML IN SOLN
3.0000 mL | Freq: Three times a day (TID) | RESPIRATORY_TRACT | Status: DC
  Administered 2020-08-13 – 2020-08-22 (×28): 3 mL via RESPIRATORY_TRACT
  Filled 2020-08-13 (×29): qty 3

## 2020-08-13 NOTE — Progress Notes (Signed)
PROGRESS NOTE    [name]  WIO:[id number] DOB: 1960/10/08 DOA: 08/07/2020 PCP: [name], MD   Brief Narrative:  60 year old with past medical history significant for COPD, bipolar disorder, PTSD, chronic respiratory failure on home oxygen 3 L, who presented to the ED complaining of worsening shortness of breath associated with cough, wheezing.  She is a current smoker 1 pack/day. Chest x-ray on presentation did not show any acute pulmonary findings.  Patient was admitted for management of COPD exacerbation.   Assessment & Plan:   Active Problems:   COPD exacerbation ([place])  1-Acute on Chronic Hypoxic Respiratory Failure: Secondary to COPD exacerbation -Currently on 3 L home oxygen -Continue with IV steroids, bronchodilators, incentive spirometry. -Completed azithromycin. -Continue with guaifenesin. -ECHO; normal.  Patient was more lethargic  6/17 , transfer to progressive unit for closer monitor. Chest x ray with opacity vs atelectasis. Added  Unasyn.  Appreciate Pulmonary evaluation.  respiratory panel negative Decrease IV solumedrol to BID>  Less wheezing on lung exam, report Dyspnea on exertion.   2-Tobacco use: Counseling provided.  3-Bacteria vaginosis: Continue with flagyl for 7 days.  Continue with Clotrimazole to cover for Candida infection.   4-Hypothyroidism: Continue with Synthroid.  History of depression/bipolar/PTSD: Continue with Wellbutrin and Effexor.  GERD: Continue with PPI.  Hyperlipidemia; on statins.   Leukocytosis: Secondary to a steroid.  Steroid-induced hyperglycemia; SSI. On lantus.  HB A1c 6.8  AKI resolved. Sleep Apnea; CPAP/  Morbid obesity. BMI 36.4 HTN; on HCTZ, change to lasix.  BP elevated, increase Hydralazine to 50 mg  Tachycardia; Atenol discontinue due to COPD. Discuss with Dr Chase Caller plan to start bisoprolol.   Estimated body mass index is 36.49 kg/m as calculated from the following:   Height as of  this encounter: 5\' 8"  (1.727 m).   Weight as of this encounter: 108.9 kg.   DVT prophylaxis: Lovenox Code Status: Full code Family Communication: Care discussed with patient.  Disposition Plan:  Status is: Inpatient  Remains inpatient appropriate because:IV treatments appropriate due to intensity of illness or inability to take PO  Dispo: The patient is from: Home              Anticipated d/c is to: SNF PT recommend SNF, will consult SW              Patient currently is not medically stable to d/c.   Difficult to place patient No        Consultants:  none  Procedures:  ECHO  Antimicrobials:    Subjective: Breathing better, but still has Dyspnea on minimal exertion.   Objective: Vitals:   08/13/20 0304 08/13/20 0452 08/13/20 0730 08/13/20 1218  BP:  (!) 159/81  (!) 164/95  Pulse: 98 87  (!) 113  Resp: (!) 21 (!) 21  (!) 22  Temp:  97.9 F (36.6 C)  (!) 97.5 F (36.4 C)  TempSrc:  Axillary  Oral  SpO2: 100% 100% 99% 100%  Weight:      Height:        Intake/Output Summary (Last 24 hours) at 08/13/2020 1347 Last data filed at 08/13/2020 1230 Gross per 24 hour  Intake 1770.37 ml  Output 400 ml  Net 1370.37 ml    Filed Weights   08/07/20 1850  Weight: 108.9 kg    Examination:  General exam: NAD Respiratory system: BBL air movement, no significant wheezing.  Cardiovascular system:  S 1, S 2 Tachycardia Gastrointestinal system: BS present, sfot, nt Central nervous  PROGRESS NOTE    [name]  WIO:[id number] DOB: 1960/10/08 DOA: 08/07/2020 PCP: [name], MD   Brief Narrative:  60 year old with past medical history significant for COPD, bipolar disorder, PTSD, chronic respiratory failure on home oxygen 3 L, who presented to the ED complaining of worsening shortness of breath associated with cough, wheezing.  She is a current smoker 1 pack/day. Chest x-ray on presentation did not show any acute pulmonary findings.  Patient was admitted for management of COPD exacerbation.   Assessment & Plan:   Active Problems:   COPD exacerbation ([place])  1-Acute on Chronic Hypoxic Respiratory Failure: Secondary to COPD exacerbation -Currently on 3 L home oxygen -Continue with IV steroids, bronchodilators, incentive spirometry. -Completed azithromycin. -Continue with guaifenesin. -ECHO; normal.  Patient was more lethargic  6/17 , transfer to progressive unit for closer monitor. Chest x ray with opacity vs atelectasis. Added  Unasyn.  Appreciate Pulmonary evaluation.  respiratory panel negative Decrease IV solumedrol to BID>  Less wheezing on lung exam, report Dyspnea on exertion.   2-Tobacco use: Counseling provided.  3-Bacteria vaginosis: Continue with flagyl for 7 days.  Continue with Clotrimazole to cover for Candida infection.   4-Hypothyroidism: Continue with Synthroid.  History of depression/bipolar/PTSD: Continue with Wellbutrin and Effexor.  GERD: Continue with PPI.  Hyperlipidemia; on statins.   Leukocytosis: Secondary to a steroid.  Steroid-induced hyperglycemia; SSI. On lantus.  HB A1c 6.8  AKI resolved. Sleep Apnea; CPAP/  Morbid obesity. BMI 36.4 HTN; on HCTZ, change to lasix.  BP elevated, increase Hydralazine to 50 mg  Tachycardia; Atenol discontinue due to COPD. Discuss with Dr Chase Caller plan to start bisoprolol.   Estimated body mass index is 36.49 kg/m as calculated from the following:   Height as of  this encounter: 5\' 8"  (1.727 m).   Weight as of this encounter: 108.9 kg.   DVT prophylaxis: Lovenox Code Status: Full code Family Communication: Care discussed with patient.  Disposition Plan:  Status is: Inpatient  Remains inpatient appropriate because:IV treatments appropriate due to intensity of illness or inability to take PO  Dispo: The patient is from: Home              Anticipated d/c is to: SNF PT recommend SNF, will consult SW              Patient currently is not medically stable to d/c.   Difficult to place patient No        Consultants:  none  Procedures:  ECHO  Antimicrobials:    Subjective: Breathing better, but still has Dyspnea on minimal exertion.   Objective: Vitals:   08/13/20 0304 08/13/20 0452 08/13/20 0730 08/13/20 1218  BP:  (!) 159/81  (!) 164/95  Pulse: 98 87  (!) 113  Resp: (!) 21 (!) 21  (!) 22  Temp:  97.9 F (36.6 C)  (!) 97.5 F (36.4 C)  TempSrc:  Axillary  Oral  SpO2: 100% 100% 99% 100%  Weight:      Height:        Intake/Output Summary (Last 24 hours) at 08/13/2020 1347 Last data filed at 08/13/2020 1230 Gross per 24 hour  Intake 1770.37 ml  Output 400 ml  Net 1370.37 ml    Filed Weights   08/07/20 1850  Weight: 108.9 kg    Examination:  General exam: NAD Respiratory system: BBL air movement, no significant wheezing.  Cardiovascular system:  S 1, S 2 Tachycardia Gastrointestinal system: BS present, sfot, nt Central nervous  Comment: Performed at [facility], [place] [address], [place], [place] [address]          Radiology Studies: ECHOCARDIOGRAM COMPLETE  Result Date: 08/11/2020    ECHOCARDIOGRAM REPORT   Patient Name:   Joan [name] Date of Exam: 08/11/2020 Medical Rec #:  [id number]   Height:       68.0 in Accession #:    [id number]  Weight:       240.0 lb Date of Birth:  Nov 10, 1960    BSA:          2.208 m Patient Age:    32 years    BP:           152/82 mmHg Patient Gender: F           HR:           77 bpm. Exam Location:  Inpatient Procedure: 2D Echo, Cardiac Doppler and Color Doppler Indications:    Dyspnea R06.00  History:        Patient has prior history of Echocardiogram examinations, most                 recent 09/27/2016. COPD; Risk Factors:Hypertension, Diabetes,                 Dyslipidemia, Sleep Apnea and GERD. Hypothyroidism. Alcoholism.  Sonographer:    Jonelle Sidle Dance Referring Phys: [id number] Yuvaan Olander A Irvin Lizama IMPRESSIONS  1. Left ventricular ejection fraction, by estimation, is 60 to 65%. The left ventricle has normal function. The left ventricle has no regional wall motion abnormalities. Left ventricular diastolic parameters were normal.  2. Right ventricular systolic function is normal. The right ventricular size is normal.  3. The mitral valve is grossly normal. No evidence of mitral valve regurgitation.  4. The aortic valve is grossly normal. Aortic valve  regurgitation is not visualized. No aortic stenosis is present. FINDINGS  Left Ventricle: Left ventricular ejection fraction, by estimation, is 60 to 65%. The left ventricle has normal function. The left ventricle has no regional wall motion abnormalities. The left ventricular internal cavity size was normal in size. There is  no left ventricular hypertrophy. Left ventricular diastolic parameters were normal. Right Ventricle: The right ventricular size is normal. Right vetricular wall thickness was not well visualized. Right ventricular systolic function is normal. Left Atrium: Left atrial size was normal in size. Right Atrium: Right atrial size was normal in size. Pericardium: There is no evidence of pericardial effusion. Mitral Valve: The mitral valve is grossly normal. No evidence of mitral valve regurgitation. Tricuspid Valve: The tricuspid valve is grossly normal. Tricuspid valve regurgitation is trivial. Aortic Valve: The aortic valve is grossly normal. Aortic valve regurgitation is not visualized. No aortic stenosis is present. Pulmonic Valve: The pulmonic valve was grossly normal. Pulmonic valve regurgitation is not visualized. Aorta: The aortic root and ascending aorta are structurally normal, with no evidence of dilitation. IAS/Shunts: The atrial septum is grossly normal.  LEFT VENTRICLE PLAX 2D LVIDd:         4.40 cm Diastology LVIDs:         2.90 cm LV e' medial:    8.16 cm/s LV PW:         1.20 cm LV E/e' medial:  11.9 LV IVS:        0.80 cm LV e' lateral:   10.60 cm/s  PROGRESS NOTE    [name]  WIO:[id number] DOB: 1960/10/08 DOA: 08/07/2020 PCP: [name], MD   Brief Narrative:  60 year old with past medical history significant for COPD, bipolar disorder, PTSD, chronic respiratory failure on home oxygen 3 L, who presented to the ED complaining of worsening shortness of breath associated with cough, wheezing.  She is a current smoker 1 pack/day. Chest x-ray on presentation did not show any acute pulmonary findings.  Patient was admitted for management of COPD exacerbation.   Assessment & Plan:   Active Problems:   COPD exacerbation ([place])  1-Acute on Chronic Hypoxic Respiratory Failure: Secondary to COPD exacerbation -Currently on 3 L home oxygen -Continue with IV steroids, bronchodilators, incentive spirometry. -Completed azithromycin. -Continue with guaifenesin. -ECHO; normal.  Patient was more lethargic  6/17 , transfer to progressive unit for closer monitor. Chest x ray with opacity vs atelectasis. Added  Unasyn.  Appreciate Pulmonary evaluation.  respiratory panel negative Decrease IV solumedrol to BID>  Less wheezing on lung exam, report Dyspnea on exertion.   2-Tobacco use: Counseling provided.  3-Bacteria vaginosis: Continue with flagyl for 7 days.  Continue with Clotrimazole to cover for Candida infection.   4-Hypothyroidism: Continue with Synthroid.  History of depression/bipolar/PTSD: Continue with Wellbutrin and Effexor.  GERD: Continue with PPI.  Hyperlipidemia; on statins.   Leukocytosis: Secondary to a steroid.  Steroid-induced hyperglycemia; SSI. On lantus.  HB A1c 6.8  AKI resolved. Sleep Apnea; CPAP/  Morbid obesity. BMI 36.4 HTN; on HCTZ, change to lasix.  BP elevated, increase Hydralazine to 50 mg  Tachycardia; Atenol discontinue due to COPD. Discuss with Dr Chase Caller plan to start bisoprolol.   Estimated body mass index is 36.49 kg/m as calculated from the following:   Height as of  this encounter: 5\' 8"  (1.727 m).   Weight as of this encounter: 108.9 kg.   DVT prophylaxis: Lovenox Code Status: Full code Family Communication: Care discussed with patient.  Disposition Plan:  Status is: Inpatient  Remains inpatient appropriate because:IV treatments appropriate due to intensity of illness or inability to take PO  Dispo: The patient is from: Home              Anticipated d/c is to: SNF PT recommend SNF, will consult SW              Patient currently is not medically stable to d/c.   Difficult to place patient No        Consultants:  none  Procedures:  ECHO  Antimicrobials:    Subjective: Breathing better, but still has Dyspnea on minimal exertion.   Objective: Vitals:   08/13/20 0304 08/13/20 0452 08/13/20 0730 08/13/20 1218  BP:  (!) 159/81  (!) 164/95  Pulse: 98 87  (!) 113  Resp: (!) 21 (!) 21  (!) 22  Temp:  97.9 F (36.6 C)  (!) 97.5 F (36.4 C)  TempSrc:  Axillary  Oral  SpO2: 100% 100% 99% 100%  Weight:      Height:        Intake/Output Summary (Last 24 hours) at 08/13/2020 1347 Last data filed at 08/13/2020 1230 Gross per 24 hour  Intake 1770.37 ml  Output 400 ml  Net 1370.37 ml    Filed Weights   08/07/20 1850  Weight: 108.9 kg    Examination:  General exam: NAD Respiratory system: BBL air movement, no significant wheezing.  Cardiovascular system:  S 1, S 2 Tachycardia Gastrointestinal system: BS present, sfot, nt Central nervous  Comment: Performed at [facility], [place] [address], [place], [place] [address]          Radiology Studies: ECHOCARDIOGRAM COMPLETE  Result Date: 08/11/2020    ECHOCARDIOGRAM REPORT   Patient Name:   Joan [name] Date of Exam: 08/11/2020 Medical Rec #:  [id number]   Height:       68.0 in Accession #:    [id number]  Weight:       240.0 lb Date of Birth:  Nov 10, 1960    BSA:          2.208 m Patient Age:    32 years    BP:           152/82 mmHg Patient Gender: F           HR:           77 bpm. Exam Location:  Inpatient Procedure: 2D Echo, Cardiac Doppler and Color Doppler Indications:    Dyspnea R06.00  History:        Patient has prior history of Echocardiogram examinations, most                 recent 09/27/2016. COPD; Risk Factors:Hypertension, Diabetes,                 Dyslipidemia, Sleep Apnea and GERD. Hypothyroidism. Alcoholism.  Sonographer:    Jonelle Sidle Dance Referring Phys: [id number] Yuvaan Olander A Irvin Lizama IMPRESSIONS  1. Left ventricular ejection fraction, by estimation, is 60 to 65%. The left ventricle has normal function. The left ventricle has no regional wall motion abnormalities. Left ventricular diastolic parameters were normal.  2. Right ventricular systolic function is normal. The right ventricular size is normal.  3. The mitral valve is grossly normal. No evidence of mitral valve regurgitation.  4. The aortic valve is grossly normal. Aortic valve  regurgitation is not visualized. No aortic stenosis is present. FINDINGS  Left Ventricle: Left ventricular ejection fraction, by estimation, is 60 to 65%. The left ventricle has normal function. The left ventricle has no regional wall motion abnormalities. The left ventricular internal cavity size was normal in size. There is  no left ventricular hypertrophy. Left ventricular diastolic parameters were normal. Right Ventricle: The right ventricular size is normal. Right vetricular wall thickness was not well visualized. Right ventricular systolic function is normal. Left Atrium: Left atrial size was normal in size. Right Atrium: Right atrial size was normal in size. Pericardium: There is no evidence of pericardial effusion. Mitral Valve: The mitral valve is grossly normal. No evidence of mitral valve regurgitation. Tricuspid Valve: The tricuspid valve is grossly normal. Tricuspid valve regurgitation is trivial. Aortic Valve: The aortic valve is grossly normal. Aortic valve regurgitation is not visualized. No aortic stenosis is present. Pulmonic Valve: The pulmonic valve was grossly normal. Pulmonic valve regurgitation is not visualized. Aorta: The aortic root and ascending aorta are structurally normal, with no evidence of dilitation. IAS/Shunts: The atrial septum is grossly normal.  LEFT VENTRICLE PLAX 2D LVIDd:         4.40 cm Diastology LVIDs:         2.90 cm LV e' medial:    8.16 cm/s LV PW:         1.20 cm LV E/e' medial:  11.9 LV IVS:        0.80 cm LV e' lateral:   10.60 cm/s

## 2020-08-14 DIAGNOSIS — J441 Chronic obstructive pulmonary disease with (acute) exacerbation: Secondary | ICD-10-CM | POA: Diagnosis not present

## 2020-08-14 LAB — GLUCOSE, CAPILLARY
Glucose-Capillary: 211 mg/dL — ABNORMAL HIGH (ref 70–99)
Glucose-Capillary: 342 mg/dL — ABNORMAL HIGH (ref 70–99)
Glucose-Capillary: 148 mg/dL — ABNORMAL HIGH (ref 70–99)
Glucose-Capillary: 206 mg/dL — ABNORMAL HIGH (ref 70–99)

## 2020-08-14 LAB — TROPONIN I (HIGH SENSITIVITY): Troponin I (High Sensitivity): 4 ng/L (ref <18)

## 2020-08-14 LAB — PHOSPHORUS: Phosphorus: 3.1 mg/dL (ref 2.5–4.6)

## 2020-08-14 LAB — LEGIONELLA PNEUMOPHILA SEROGP 1 UR AG: L. pneumophila Serogp 1 Ur Ag: NEGATIVE

## 2020-08-14 LAB — D-DIMER, QUANTITATIVE: D-Dimer, Quant: 0.33 ug/mL-FEU (ref 0.00–0.50)

## 2020-08-14 MED ORDER — SACCHAROMYCES BOULARDII 250 MG PO CAPS
250.0000 mg | ORAL_CAPSULE | Freq: Two times a day (BID) | ORAL | Status: DC
  Administered 2020-08-14 – 2020-08-22 (×17): 250 mg via ORAL
  Filled 2020-08-14 (×17): qty 1

## 2020-08-14 MED ORDER — AMOXICILLIN-POT CLAVULANATE 875-125 MG PO TABS
1.0000 | ORAL_TABLET | Freq: Two times a day (BID) | ORAL | Status: DC
  Administered 2020-08-14 – 2020-08-15 (×2): 1 via ORAL
  Filled 2020-08-14 (×2): qty 1

## 2020-08-14 MED ORDER — FLUCONAZOLE 150 MG PO TABS
150.0000 mg | ORAL_TABLET | Freq: Once | ORAL | Status: AC
  Administered 2020-08-14: 150 mg via ORAL
  Filled 2020-08-14: qty 1

## 2020-08-14 MED ORDER — METHYLPREDNISOLONE SODIUM SUCC 40 MG IJ SOLR
40.0000 mg | Freq: Two times a day (BID) | INTRAMUSCULAR | Status: DC
  Administered 2020-08-14 – 2020-08-16 (×4): 40 mg via INTRAVENOUS
  Filled 2020-08-14 (×4): qty 1

## 2020-08-14 NOTE — Progress Notes (Addendum)
PROGRESS NOTE    Joan [name]  XAJ:[id number] DOB: 1960/08/22 DOA: 08/07/2020 PCP: [name], MD   Brief Narrative:  60 year old with past medical history significant for COPD, bipolar disorder, PTSD, chronic respiratory failure on home oxygen 3 L, who presented to the ED complaining of worsening shortness of breath associated with cough, wheezing.  She is a current smoker 1 pack/day. Chest x-ray on presentation did not show any acute pulmonary findings.  Patient was admitted for management of COPD exacerbation.   Assessment & Plan:   Active Problems:   COPD exacerbation ([place])  1-Acute on Chronic Hypoxic Respiratory Failure: Secondary to COPD exacerbation -Currently on 3 L home oxygen -Continue with IV steroids, bronchodilators, incentive spirometry. -Completed azithromycin. -Continue with guaifenesin. -ECHO; normal.  Patient was more lethargic  6/17 , transfer to progressive unit for closer monitor. Chest x ray with opacity vs atelectasis. Change Unasyn to Augmentin to complete 5 days treatment.  Appreciate Pulmonary evaluation.  respiratory panel negative Continue with IV solumedrol to BID>  Lung sound improved.  Will check D dimer. If positive will proceed with VQ scan.    2-Dyspnea on exertion; chronic chest pain Plan to repeat troponin.  EKG.  Prior Coronary CT 2020 didn't showed significant stenosis.  Cardiology Consulted.   3-Bacteria vaginosis: Continue with flagyl for 7 days.  Change Clotrimazole to diflucan one time dose.   4-Hypothyroidism: Continue with Synthroid.  History of depression/bipolar/PTSD: Continue with Wellbutrin and Effexor.  GERD: Continue with PPI.  Hyperlipidemia; on statins.   Leukocytosis: Secondary to a steroid.  Steroid-induced hyperglycemia; SSI. On lantus.  HB A1c 6.8  AKI resolved.  Sleep Apnea; CPAP/   Morbid obesity. BMI 36.4  HTN; on HCTZ, change to lasix.  BP elevated, increase Hydralazine to 50 mg   Tachycardia; Atenol discontinue due to COPD. Discuss with Dr Chase Caller plan to started bisoprolol.  Tobacco use: Counseling provided. Estimated body mass index is 36.49 kg/m as calculated from the following:   Height as of this encounter: 5\' 8"  (1.727 m).   Weight as of this encounter: 108.9 kg.   DVT prophylaxis: Lovenox Code Status: Full code Family Communication: Care discussed with patient. Daughter over phone 6/20 Disposition Plan:  Status is: Inpatient  Remains inpatient appropriate because:IV treatments appropriate due to intensity of illness or inability to take PO  Dispo: The patient is from: Home              Anticipated d/c is to: SNF PT recommend SNF, will consult SW              Patient currently is not medically stable to d/c.   Difficult to place patient No        Consultants:  none  Procedures:  ECHO  Antimicrobials:    Subjective: Dyspnea improved. Complaint of dyspnea on exertion.  She wants to see her cardiologist.   Objective: Vitals:   08/13/20 2337 08/14/20 0704 08/14/20 0747 08/14/20 0750  BP:  (!) 163/87    Pulse: 85 85    Resp: 20 20    Temp:  97.7 F (36.5 C)    TempSrc:      SpO2: 97% 97% 97% 97%  Weight:      Height:        Intake/Output Summary (Last 24 hours) at 08/14/2020 1339 Last data filed at 08/14/2020 1000 Gross per 24 hour  Intake 1858.71 ml  Output 2250 ml  Net -391.29 ml    [employer]  08/07/20 1850  Weight: 108.9 kg    Examination:  General exam: NAD Respiratory system: BL air movement, expiratory wheezing improved Cardiovascular system:  S 1, S 2 RRR Gastrointestinal system: BS present, soft , nt Central nervous system: Alert, follows command Extremities: trace edema  Data Reviewed: I have personally reviewed following labs and imaging studies  CBC: Recent Labs  Lab 08/07/20 1943 08/08/20 0550 08/09/20 0537 08/10/20 0508 08/12/20 0843 08/13/20 0546  WBC 9.3 10.8* 21.1* 13.2* 15.1* 16.5*   NEUTROABS 7.4  --   --  11.5*  --   --   HGB 13.1 12.5 13.5 11.6* 13.6 13.1  HCT 39.8 37.1 42.2 35.8* 42.3 38.3  MCV 92.6 92.5 95.3 93.7 94.2 91.2  PLT 222 204 244 245 278 347    Basic Metabolic Panel: Recent Labs  Lab 08/09/20 1040 08/10/20 1820 08/11/20 0523 08/12/20 0843 08/13/20 0546 08/14/20 1041  NA 136 134* 136 137 137  --   K 4.0 4.4 4.4 3.8 4.1  --   CL 98 89* 94* 96* 101  --   CO2 29 32 32 31 28  --   GLUCOSE 226* 214* 238* 173* 212*  --   BUN 19 21* 25* 25* 28*  --   CREATININE 0.96 0.96 1.03* 0.92 0.86  --   CALCIUM 9.1 9.7 9.3 9.1 8.8*  --   MG  --   --   --   --  2.2  --   PHOS  --   --   --   --  3.6 3.1    GFR: Estimated Creatinine Clearance: 92.2 mL/min (by C-G formula based on SCr of 0.86 mg/dL). Liver Function Tests: Recent Labs  Lab 08/07/20 1943 08/09/20 0537  AST 29 34  ALT 30 35  ALKPHOS 117 97  BILITOT 0.3 1.5*  PROT 7.1 7.5  ALBUMIN 4.0 3.9    Recent Labs  Lab 08/07/20 1943  LIPASE 32    No results for input(s): AMMONIA in the last 168 hours. Coagulation Profile: No results for input(s): INR, PROTIME in the last 168 hours. Cardiac Enzymes: No results for input(s): CKTOTAL, CKMB, CKMBINDEX, TROPONINI in the last 168 hours. BNP (last 3 results) No results for input(s): PROBNP in the last 8760 hours. HbA1C: No results for input(s): HGBA1C in the last 72 hours.  CBG: Recent Labs  Lab 08/13/20 1205 08/13/20 1708 08/13/20 2120 08/14/20 0756 08/14/20 1207  GLUCAP 295* 96 173* 211* 342*    Lipid Profile: No results for input(s): CHOL, HDL, LDLCALC, TRIG, CHOLHDL, LDLDIRECT in the last 72 hours. Thyroid Function Tests: No results for input(s): TSH, T4TOTAL, FREET4, T3FREE, THYROIDAB in the last 72 hours. Anemia Panel: Recent Labs    08/12/20 0913  VITAMINB12 408    Sepsis Labs: Recent Labs  Lab 08/10/20 1951 08/11/20 0523  PROCALCITON <0.10 <0.10  LATICACIDVEN 2.2*  --      Recent Results (from the past 240  hour(s))  Resp Panel by RT-PCR (Flu A&B, Covid) Nasopharyngeal Swab     Status: None   Collection Time: 08/07/20  8:10 PM   Specimen: Nasopharyngeal Swab; Nasopharyngeal(NP) swabs in vial transport medium  Result Value Ref Range Status   SARS Coronavirus 2 by RT PCR NEGATIVE NEGATIVE Final    Comment: (NOTE) SARS-CoV-2 target nucleic acids are NOT DETECTED.  The SARS-CoV-2 RNA is generally detectable in upper respiratory specimens during the acute phase of infection. The lowest concentration of SARS-CoV-2 viral copies this assay can detect is  138 copies/mL. A negative result does not preclude SARS-Cov-2 infection and should not be used as the sole basis for treatment or other patient management decisions. A negative result may occur with  improper specimen collection/handling, submission of specimen other than nasopharyngeal swab, presence of viral mutation(s) within the areas targeted by this assay, and inadequate number of viral copies(<138 copies/mL). A negative result must be combined with clinical observations, patient history, and epidemiological information. The expected result is Negative.  Fact Sheet for Patients:  EntrepreneurPulse.com.au  Fact Sheet for Healthcare Providers:  IncredibleEmployment.be  This test is no t yet approved or cleared by the [place] FDA and  has been authorized for detection and/or diagnosis of SARS-CoV-2 by FDA under an Emergency Use Authorization (EUA). This EUA will remain  in effect (meaning this test can be used) for the duration of the COVID-19 declaration under Section 564(b)(1) of the Act, 21 U.S.C.section 360bbb-3(b)(1), unless the authorization is terminated  or revoked sooner.       Influenza A by PCR NEGATIVE NEGATIVE Final   Influenza B by PCR NEGATIVE NEGATIVE Final    Comment: (NOTE) The Xpert Xpress SARS-CoV-2/FLU/RSV plus assay is intended as an aid in the diagnosis of influenza from  Nasopharyngeal swab specimens and should not be used as a sole basis for treatment. Nasal washings and aspirates are unacceptable for Xpert Xpress SARS-CoV-2/FLU/RSV testing.  Fact Sheet for Patients: EntrepreneurPulse.com.au  Fact Sheet for Healthcare Providers: IncredibleEmployment.be  This test is not yet approved or cleared by the [place] FDA and has been authorized for detection and/or diagnosis of SARS-CoV-2 by FDA under an Emergency Use Authorization (EUA). This EUA will remain in effect (meaning this test can be used) for the duration of the COVID-19 declaration under Section 564(b)(1) of the Act, 21 U.S.C. section 360bbb-3(b)(1), unless the authorization is terminated or revoked.  Performed at [facility], [place] [address] [place], [place] [address]   MRSA PCR Screening     Status: Abnormal   Collection Time: 08/11/20  9:35 AM  Result Value Ref Range Status   MRSA by PCR POSITIVE (A) NEGATIVE Final    Comment:        The GeneXpert MRSA Assay (FDA approved for NASAL specimens only), is one component of a comprehensive MRSA colonization surveillance program. It is not intended to diagnose MRSA infection nor to guide or monitor treatment for MRSA infections. CRITICAL VALUE NOTED.  VALUE IS CONSISTENT WITH PREVIOUSLY REPORTED AND CALLED VALUE. POLLET E. ON 08/11/2020 @ 1141 BY MECIAL J. Performed at [facility], [place] [address] [place], [place] [address]   Respiratory (~20 pathogens) panel by PCR     Status: None   Collection Time: 08/12/20 12:25 PM   Specimen: Nasopharyngeal Swab; Respiratory  Result Value Ref Range Status   Adenovirus NOT DETECTED NOT DETECTED Final   Coronavirus 229E NOT DETECTED NOT DETECTED Final    Comment: (NOTE) The Coronavirus on the Respiratory Panel, DOES NOT test for the novel  Coronavirus (2019 nCoV)    Coronavirus HKU1 NOT DETECTED NOT DETECTED Final    Coronavirus NL63 NOT DETECTED NOT DETECTED Final   Coronavirus OC43 NOT DETECTED NOT DETECTED Final   Metapneumovirus NOT DETECTED NOT DETECTED Final   Rhinovirus / Enterovirus NOT DETECTED NOT DETECTED Final   Influenza A NOT DETECTED NOT DETECTED Final   Influenza B NOT DETECTED NOT DETECTED Final   Parainfluenza Virus 1 NOT DETECTED NOT DETECTED Final   Parainfluenza Virus 2  NOT DETECTED NOT DETECTED Final   Parainfluenza Virus 3 NOT DETECTED NOT DETECTED Final   Parainfluenza Virus 4 NOT DETECTED NOT DETECTED Final   Respiratory Syncytial Virus NOT DETECTED NOT DETECTED Final   Bordetella pertussis NOT DETECTED NOT DETECTED Final   Bordetella Parapertussis NOT DETECTED NOT DETECTED Final   Chlamydophila pneumoniae NOT DETECTED NOT DETECTED Final   Mycoplasma pneumoniae NOT DETECTED NOT DETECTED Final    Comment: Performed at [facility], [place] [address] [place], [place] [address]          Radiology Studies: No results found.      Scheduled Meds:  acyclovir  400 mg Oral BID   atorvastatin  20 mg Oral Daily   bisoprolol  5 mg Oral Daily   budesonide (PULMICORT) nebulizer solution  0.5 mg Nebulization BID   buPROPion  150 mg Oral Daily   chlorhexidine  15 mL Mouth Rinse BID   Chlorhexidine Gluconate Cloth  6 each Topical Daily   clotrimazole  1 Applicatorful Vaginal QHS   enoxaparin (LOVENOX) injection  40 mg Subcutaneous Q24H   furosemide  20 mg Oral Daily   guaiFENesin  1,200 mg Oral BID   hydrALAZINE  50 mg Oral Q8H   insulin aspart  0-15 Units Subcutaneous TID WC   insulin aspart  4 Units Subcutaneous TID WC   insulin glargine  10 Units Subcutaneous Daily   ipratropium-albuterol  3 mL Nebulization TID   levothyroxine  25 mcg Oral Q0600   mouth rinse  15 mL Mouth Rinse q12n4p   methylPREDNISolone (SOLU-MEDROL) injection  60 mg Intravenous Q12H   metroNIDAZOLE  500 mg Oral Q12H   montelukast  10 mg Oral QHS   mupirocin ointment  1 application Nasal BID    nicotine  14 mg Transdermal Daily   olopatadine  1 drop Both Eyes BID   pantoprazole  40 mg Oral Daily   pregabalin  150 mg Oral QHS   QUEtiapine  50 mg Oral QHS   saccharomyces boulardii  250 mg Oral BID   venlafaxine XR  150 mg Oral Q breakfast   Continuous Infusions:  sodium chloride 10 mL/hr at 08/12/20 1739   ampicillin-sulbactam (UNASYN) IV 3 g (08/14/20 1228)     LOS: 7 days    Time spent: 35 minutes.     Elmarie Shiley, MD [facility]   If 7PM-7AM, please contact night-coverage [email or web address]  08/14/2020, 1:39 PM

## 2020-08-14 NOTE — Plan of Care (Signed)
  Problem: Education: Goal: Knowledge of General Education information will improve Description: Including pain rating scale, medication(s)/side effects and non-pharmacologic comfort measures Outcome: Progressing   Problem: Health Behavior/Discharge Planning: Goal: Ability to manage health-related needs will improve Outcome: Progressing   Problem: Clinical Measurements: Goal: Will remain free from infection Outcome: Progressing   

## 2020-08-14 NOTE — Progress Notes (Signed)
Physical Therapy Treatment Patient Details Name: Joan [name] MRN: [id number] DOB: 10/22/1960 Today's Date: 08/14/2020    History of Present Illness Patient is 60 y.o. female admitted on 08/07/2020 with COPD exacerbation with PMH significant for HTN, hypothyroidism, GERD, COPD, DM, chronic pain, anxiety, depression, PTSD, bipolar d/o, smoker, Lt THR on 01/26/19, s/p laproscopic appendectomy on 03/31/19 and OA.    PT Comments    Pt sitting EOB Indep on 3 lts oxygen at 96% with HR 86.  Assisted with amb in hallway.  General transfer comment: increased time but good safety cognition and use of hands to steady self.General Gait Details: Increased time with multiple short standing rest breaks and cues for posture and position from RW.  Remained on 3 lts sats >92% and HR 101. Dyspnea 3/4.  Assisted back to room then assisted with [facility] transfer.    Follow Up Recommendations  SNF     Equipment Recommendations  None recommended by PT    Recommendations for Other Services       Precautions / Restrictions Precautions Precautions: Fall Precaution Comments: O2 dep    Mobility  Bed Mobility               General bed mobility comments: sitting EOB on arrival    Transfers Overall transfer level: Needs assistance Equipment used: Rolling walker (2 wheeled) Transfers: Sit to/from [facility] Sit to Stand: Supervision Stand pivot transfers: Supervision       General transfer comment: increased time but good safety cognition and use of hands to steady self.  Ambulation/Gait Ambulation/Gait assistance: Supervision Gait Distance (Feet): 65 Feet Assistive device: Rolling walker (2 wheeled) Gait Pattern/deviations: Step-to pattern;Decreased step length - right;Decreased step length - left;Shuffle;Trunk flexed Gait velocity: decreased   General Gait Details: Increased time with multiple short standing rest breaks and cues for posture and position from RW.  Remained on  3 lts sats >92% and HR 101.   Stairs             Wheelchair Mobility    Modified Rankin (Stroke Patients Only)       Balance                                            Cognition Arousal/Alertness: Awake/alert Behavior During Therapy: WFL for tasks assessed/performed Overall Cognitive Status: Within Functional Limits for tasks assessed                                 General Comments: AxO x 3 memorable from her hip surgery last year.  Retired [employer]ar[employer] manager.  Hyper talkative.      Exercises      General Comments        Pertinent Vitals/Pain Pain Assessment: No/denies pain    Home Living                      Prior Function            PT Goals (current goals can now be found in the care plan section) Progress towards PT goals: Progressing toward goals    Frequency    Min 3X/week      PT Plan Current plan remains appropriate    Co-evaluation              AM-PAC PT "  6 Clicks" Mobility   Outcome Measure  Help needed turning from your back to your side while in a flat bed without using bedrails?: A Little Help needed moving from lying on your back to sitting on the side of a flat bed without using bedrails?: A Little Help needed moving to and from a bed to a chair (including a wheelchair)?: A Little Help needed standing up from a chair using your arms (e.g., wheelchair or bedside chair)?: A Little Help needed to walk in hospital room?: A Little Help needed climbing 3-5 steps with a railing? : A Lot 6 Click Score: 17    End of Session Equipment Utilized During Treatment: Gait belt;Oxygen Activity Tolerance: Patient limited by fatigue Patient left: in bed;with call bell/phone within reach Nurse Communication: Mobility status PT Visit Diagnosis: Unsteadiness on feet (R26.81);Muscle weakness (generalized) (M62.81);Difficulty in walking, not elsewhere classified (R26.2)     Time: 7681-1572 PT Time Calculation  (min) (ACUTE ONLY): 31 min  Charges:  $Gait Training: 8-22 mins $Therapeutic Activity: 8-22 mins                     Rica Koyanagi  PTA Acute  Rehabilitation Services Pager      [phone] Office      [phone]

## 2020-08-14 NOTE — Progress Notes (Signed)
Put everything on CPAP for patient.  Hooked up 3L O2 to machine and placed machine at bedside for patient.  According to patient, she puts herself on and off all night long.  I explained that her O2 was only running through the machine, patient stated she understood.  Machine is at bedside for patient.

## 2020-08-14 NOTE — Progress Notes (Signed)
Pharmacy Antibiotic Note  Joan [name] is a 60 y.o. female admitted on 08/07/2020 with AECOPD.  On 6/17, Pharmacy consulted for Unasyn dosing for pneumonia.  Serum creatinine stable and Unasyn dosing remains at 3 g IV q6hr.  Patient also on Flagyl 500 mg po every 12 hours for 7 days total for bacterial vaginosis.  Plan: Continue Unasyn 3 g IV every 6 hours Pharmacy will sign off, following peripherally for culture results or dose adjustments.  Please reconsult if a change in clinical status warrants re-evaluation of dosage.   Height: 5\' 8"  (172.7 cm) Weight: 108.9 kg (240 lb) IBW/kg (Calculated) : 63.9  Temp (24hrs), Avg:97.9 F (36.6 C), Min:97.5 F (36.4 C), Max:98.5 F (36.9 C)  Recent Labs  Lab 08/08/20 0550 08/09/20 0537 08/09/20 1040 08/10/20 0508 08/10/20 1820 08/10/20 1951 08/11/20 0523 08/12/20 0843 08/13/20 0546  WBC 10.8* 21.1*  --  13.2*  --   --   --  15.1* 16.5*  CREATININE 1.21* 1.17* 0.96  --  0.96  --  1.03* 0.92 0.86  LATICACIDVEN  --   --   --   --   --  2.2*  --   --   --      Estimated Creatinine Clearance: 92.2 mL/min (by C-G formula based on SCr of 0.86 mg/dL).    Allergies  Allergen Reactions   Keflex [Cephalexin] Rash and Other (See Comments)    Has tolerated amoxicillin since had keflex   Prednisone Other (See Comments)    "counter reacts"   Shellfish Allergy Shortness Of Breath, Nausea And Vomiting and Other (See Comments)    Stomach cramps   Tetracyclines & Related Anaphylaxis, Swelling and Other (See Comments)    Throat swelling requiring hospitalization   Dilaudid [Hydromorphone Hcl] Other (See Comments)    "Lethargy"   Incruse Ellipta [Umeclidinium Bromide] Nausea Only   Other    Tuberculin Tests Other (See Comments)    False postive    Antimicrobials this admission: 6/14 azithromycin >> 6/17 6/16 metronidazole >> (6/22) 6/17 Unasyn >>  Dose adjustments this admission:   Microbiology results: 6/17 MRSA PCR:  positive   Thank you for allowing pharmacy to be a part of this patient's care.  Efraim Kaufmann, PharmD, BCPS 08/14/2020 12:00 PM

## 2020-08-14 NOTE — Care Management Important Message (Signed)
Important Message  Patient Details IM Letter given to the Patient. Name: [name] MRN: [id number] Date of Birth: February 18, 1961   Medicare Important Message Given:  Yes     Kerin Salen 08/14/2020, 1:15 PM

## 2020-08-14 NOTE — Progress Notes (Signed)
Inpatient Diabetes Program Recommendations  AACE/ADA: New Consensus Statement on Inpatient Glycemic Control (2015)  Target Ranges:  Prepandial:   less than 140 mg/dL      Peak postprandial:   less than 180 mg/dL (1-2 hours)      Critically ill patients:  140 - 180 mg/dL   Results for DORATHEA, FAERBER (MRN [id number]) as of 08/14/2020 14:08  Ref. Range 08/13/2020 07:36 08/13/2020 12:05 08/13/2020 17:08 08/13/2020 21:20  Glucose-Capillary Latest Ref Range: 70 - 99 mg/dL 199 (H) 295 (H) 96 173 (H)   Results for TAMAR, MIANO (MRN [id number]) as of 08/14/2020 14:08  Ref. Range 08/14/2020 07:56 08/14/2020 12:07  Glucose-Capillary Latest Ref Range: 70 - 99 mg/dL 211 (H)  9 units NOVOLOG  342 (H)  15 units NOVOLOG  10 units LANTUS      Home DM Meds: None listed  Current Orders: Lantus 10 units Daily      Novolog 4 units TID with meals      Novolog Moderate Correction Scale/ SSI (0-15 units) TID AC    Solumedrol 60 mg BID    MD- Please consider:  Increase Lantus to 12 units Daily  2. Increase Novolog Meal Coverage to 8 units TID with meals   --Will follow patient during hospitalization--  Wyn Quaker RN, MSN, CDE Diabetes Coordinator Inpatient Glycemic Control Team Team Pager: [phone] (8a-5p)

## 2020-08-14 NOTE — Progress Notes (Addendum)
NAME:  Joan [name], MRN:  [id number], DOB:  08/24/1960, LOS: 7 ADMISSION DATE:  08/07/2020, CONSULTATION DATE:  08/10/20 REFERRING MD:  DR Tyrell Antonio, CHIEF COMPLAINT:  Acute COPD exacerbation -slow to resolve   BRIEF:   60 year old female with chronic hypercapnia as of 2018 but uses CPAP for sleep apnea, polypharmacy, Gold stage III bordering on Gold stage IV COPD [see PFTs below], polypharmacy, anxiety and multiple medical problems including PTSD, bipolar disorder [refuses ABG]. Presented for acute COPD exacerbation, PCCM consulted for further management.    Pertinent  Medical History  COPD  Chronic hypoxic respiratory failure Bipolar disorder Type 2 diabetes HLD HTN  Significant Hospital Events:  08/07/2020 - admit 6/176 - ccm consult 6/17 -moved to sdu - refusing arterial blood gas.  VBG yesterday did not show carbadox retention.  She will use CPAP.  This morning's apparently she was lethargic after getting her usual psych and anxiety meds and she was moved to the ICU.  Currently on 3 to 5 L nasal cannula saturating well even when she exerts but having significant dyspnea.  Has waxing and waning hoarse voice although there is no thrush.  She is complains of class IV dyspnea but nursing reports that she was able to transfer out of the bed without any desaturations.  Appears quite anxious 6/20 Continues to report exertional dyspnea with limitation mobility   Interim History / Subjective:  Sitting sitting up in chair after working with PT States overall she feels well but continues to experience exertional dyspnea   Objective   Blood pressure (!) 168/109, pulse 85, temperature (!) 97.3 F (36.3 C), temperature source Oral, resp. rate 20, height 5\' 8"  (1.727 m), weight 108.9 kg, SpO2 98 %.        Intake/Output Summary (Last 24 hours) at 08/14/2020 1421 Last data filed at 08/14/2020 1000 Gross per 24 hour  Intake 1398.71 ml  Output 2250 ml  Net -851.29 ml    Filed Weights    08/07/20 1850  Weight: 108.9 kg    Examination: General: Chronically ill appearing middle aged female sitting up in bedside recliner in NAD HEENT: [place]/AT, MM pink/moist, PERRL,  Neuro: Alert and oriented x3, non-focal  CV: s1s2 regular rate and rhythm, no murmur, rubs, or gallops,  PULM:  Bilateral inspiratory and expiratory wheeze, diminished air entry bilateral, oxygen saturation appropriate on 3L [place],  GI: soft, bowel sounds active in all 4 quadrants, non-tender, non-distended Extremities: warm/dry, ni edema  Skin: no rashes or lesions  Labs/imaging that I havepersonally reviewed   6/16 CXR negative   Resolved Hospital Problem list     Assessment & Plan:  Acute on Chronic Hypoxemic Respiratory failure  Acute COPD exacerbation with history of gold stage 3-4 copd  -RVP negative  P: Continue supplemental oxygen  BIPAP qhs Continue PRN Albuteral and Pulmicort Will discuss with attending options to optimize COPD medication management  Wean steroids  Mobilize as able  Will need to follow up with pulmonary at discharge   PCCM will sign off. Thank you for the opportunity to participate in this patient's care. Please contact if we can be of further assistance.  Best practice  Per triald  Clelia Schaumann D. Kenton Kingfisher, NP-C [facility] Personal contact information can be found on Amion  08/14/2020, 3:57 PM    Attending Note:  I have examined patient, reviewed labs, studies and notes.   She is unsure whether she got any benefit from trial of Judithann Sauger (took  for two weeks, but she also changed around her other regimen including combivent, albuterol HFA, albuterol? Nebs). I would change to prednisone and wean her steroids to off over a week.  Regimen for home (comprised of the meds she already has) >> Symbicort 2 puffs bid; combivent 2 puffs TID on a schedule; albuterol HFA or nebs available to use q4h prn for SOB  She has had some improvement here. Believe  her bronchospasm has resolved but she has severe SOB and UA noise with activity. On pantoprazole for GERD component, may benefit from a rhinitis regimen > consider add loratadine, nasal steroid if she can tolerate.   She needs to continue her CPAP qhs at discharge.   Hospital follow up visit made in our office for September 04, 2020.    Baltazar Apo, MD, PhD 08/15/2020, 10:02 AM [facility] [phone] or if no answer [phone]

## 2020-08-15 DIAGNOSIS — R0602 Shortness of breath: Secondary | ICD-10-CM

## 2020-08-15 DIAGNOSIS — Z794 Long term (current) use of insulin: Secondary | ICD-10-CM

## 2020-08-15 DIAGNOSIS — I2510 Atherosclerotic heart disease of native coronary artery without angina pectoris: Secondary | ICD-10-CM

## 2020-08-15 DIAGNOSIS — I10 Essential (primary) hypertension: Secondary | ICD-10-CM

## 2020-08-15 DIAGNOSIS — E119 Type 2 diabetes mellitus without complications: Secondary | ICD-10-CM

## 2020-08-15 DIAGNOSIS — J441 Chronic obstructive pulmonary disease with (acute) exacerbation: Secondary | ICD-10-CM | POA: Diagnosis not present

## 2020-08-15 LAB — CBC
HCT: 39.7 % (ref 36.0–46.0)
Hemoglobin: 12.6 g/dL (ref 12.0–15.0)
MCH: 30.4 pg (ref 26.0–34.0)
MCHC: 31.7 g/dL (ref 30.0–36.0)
MCV: 95.7 fL (ref 80.0–100.0)
Platelets: 269 10*3/uL (ref 150–400)
RBC: 4.15 MIL/uL (ref 3.87–5.11)
RDW: 15.2 % (ref 11.5–15.5)
WBC: 16.5 10*3/uL — ABNORMAL HIGH (ref 4.0–10.5)
nRBC: 0 % (ref 0.0–0.2)

## 2020-08-15 LAB — BASIC METABOLIC PANEL
Anion gap: 8 (ref 5–15)
BUN: 26 mg/dL — ABNORMAL HIGH (ref 6–20)
CO2: 32 mmol/L (ref 22–32)
Calcium: 9 mg/dL (ref 8.9–10.3)
Chloride: 99 mmol/L (ref 98–111)
Creatinine, Ser: 0.88 mg/dL (ref 0.44–1.00)
GFR, Estimated: >60 mL/min (ref >60)
Glucose, Bld: 178 mg/dL — ABNORMAL HIGH (ref 70–99)
Potassium: 4.1 mmol/L (ref 3.5–5.1)
Sodium: 139 mmol/L (ref 135–145)

## 2020-08-15 LAB — GLUCOSE, CAPILLARY
Glucose-Capillary: 196 mg/dL — ABNORMAL HIGH (ref 70–99)
Glucose-Capillary: 209 mg/dL — ABNORMAL HIGH (ref 70–99)
Glucose-Capillary: 247 mg/dL — ABNORMAL HIGH (ref 70–99)
Glucose-Capillary: 318 mg/dL — ABNORMAL HIGH (ref 70–99)
Glucose-Capillary: 99 mg/dL (ref 70–99)

## 2020-08-15 LAB — PHOSPHORUS: Phosphorus: 4.3 mg/dL (ref 2.5–4.6)

## 2020-08-15 MED ORDER — LORATADINE 10 MG PO TABS
10.0000 mg | ORAL_TABLET | Freq: Every day | ORAL | Status: DC
  Administered 2020-08-15 – 2020-08-22 (×8): 10 mg via ORAL
  Filled 2020-08-15 (×8): qty 1

## 2020-08-15 MED ORDER — BIOTENE DRY MOUTH MT LIQD
15.0000 mL | OROMUCOSAL | Status: DC | PRN
  Administered 2020-08-17: 15 mL via OROMUCOSAL

## 2020-08-15 MED ORDER — INSULIN ASPART 100 UNIT/ML IJ SOLN
5.0000 [IU] | Freq: Three times a day (TID) | INTRAMUSCULAR | Status: DC
  Administered 2020-08-15 – 2020-08-22 (×22): 5 [IU] via SUBCUTANEOUS

## 2020-08-15 MED ORDER — PANTOPRAZOLE SODIUM 40 MG PO TBEC
40.0000 mg | DELAYED_RELEASE_TABLET | Freq: Two times a day (BID) | ORAL | Status: DC
  Administered 2020-08-15 – 2020-08-22 (×13): 40 mg via ORAL
  Filled 2020-08-15 (×14): qty 1

## 2020-08-15 MED ORDER — MUPIROCIN CALCIUM 2 % EX CREA
TOPICAL_CREAM | Freq: Every day | CUTANEOUS | Status: DC
  Administered 2020-08-17: 1 via TOPICAL
  Filled 2020-08-15: qty 15

## 2020-08-15 MED ORDER — INSULIN GLARGINE 100 UNIT/ML ~~LOC~~ SOLN
15.0000 [IU] | Freq: Every day | SUBCUTANEOUS | Status: DC
  Administered 2020-08-15 – 2020-08-17 (×3): 15 [IU] via SUBCUTANEOUS
  Filled 2020-08-15 (×3): qty 0.15

## 2020-08-15 MED ORDER — ALUM & MAG HYDROXIDE-SIMETH 200-200-20 MG/5ML PO SUSP
30.0000 mL | Freq: Four times a day (QID) | ORAL | Status: DC | PRN
  Administered 2020-08-17 – 2020-08-19 (×3): 30 mL via ORAL
  Filled 2020-08-15 (×5): qty 30

## 2020-08-15 MED ORDER — INSULIN ASPART 100 UNIT/ML IJ SOLN
0.0000 [IU] | Freq: Three times a day (TID) | INTRAMUSCULAR | Status: DC
  Administered 2020-08-16: 5 [IU] via SUBCUTANEOUS
  Administered 2020-08-16: 3 [IU] via SUBCUTANEOUS
  Administered 2020-08-16: 11 [IU] via SUBCUTANEOUS
  Administered 2020-08-17: 3 [IU] via SUBCUTANEOUS
  Administered 2020-08-17: 5 [IU] via SUBCUTANEOUS
  Administered 2020-08-17: 3 [IU] via SUBCUTANEOUS
  Administered 2020-08-18: 15 [IU] via SUBCUTANEOUS
  Administered 2020-08-18 (×2): 3 [IU] via SUBCUTANEOUS
  Administered 2020-08-19: 5 [IU] via SUBCUTANEOUS
  Administered 2020-08-19: 8 [IU] via SUBCUTANEOUS
  Administered 2020-08-19 – 2020-08-20 (×2): 3 [IU] via SUBCUTANEOUS
  Administered 2020-08-20: 8 [IU] via SUBCUTANEOUS
  Administered 2020-08-20: 2 [IU] via SUBCUTANEOUS
  Administered 2020-08-21: 5 [IU] via SUBCUTANEOUS
  Administered 2020-08-21: 3 [IU] via SUBCUTANEOUS
  Administered 2020-08-21: 5 [IU] via SUBCUTANEOUS
  Administered 2020-08-22: 8 [IU] via SUBCUTANEOUS
  Administered 2020-08-22: 5 [IU] via SUBCUTANEOUS

## 2020-08-15 MED ORDER — HYDROCORTISONE ACETATE 25 MG RE SUPP
25.0000 mg | Freq: Two times a day (BID) | RECTAL | Status: DC | PRN
  Filled 2020-08-15: qty 1

## 2020-08-15 MED ORDER — FLUTICASONE PROPIONATE 50 MCG/ACT NA SUSP
1.0000 | Freq: Every day | NASAL | Status: DC
  Administered 2020-08-15 – 2020-08-22 (×7): 1 via NASAL
  Filled 2020-08-15: qty 16

## 2020-08-15 MED ORDER — BACITRACIN-NEOMYCIN-POLYMYXIN 400-5-5000 EX OINT: TOPICAL_OINTMENT | Freq: Two times a day (BID) | CUTANEOUS | Status: DC

## 2020-08-15 MED ORDER — INSULIN ASPART 100 UNIT/ML IJ SOLN
0.0000 [IU] | Freq: Every day | INTRAMUSCULAR | Status: DC
  Administered 2020-08-15: 2 [IU] via SUBCUTANEOUS

## 2020-08-15 NOTE — Progress Notes (Addendum)
PROGRESS NOTE    Joan [name]  BTD:[id number] DOB: 1960/08/10 DOA: 08/07/2020 PCP: [name], MD   Brief Narrative:  60 year old with past medical history significant for COPD, bipolar disorder, PTSD, chronic respiratory failure on home oxygen 3 L, who presented to the ED complaining of worsening shortness of breath associated with cough, wheezing.  She is a current smoker 1 pack/day. Chest x-ray on presentation did not show any acute pulmonary findings.  Patient was admitted for management of COPD exacerbation.  Patient admitted for Acute on chronic hypoxic respiratory failure secondary to COPD exacerbation.   Assessment & Plan:   Active Problems:   COPD exacerbation ([place])  1-Acute on Chronic Hypoxic Respiratory Failure: Secondary to COPD exacerbation -Currently on 3 L home oxygen -Continue with IV steroids, bronchodilators, incentive spirometry. -Completed azithromycin. -Continue with guaifenesin. -ECHO; normal. -D dimer negative.  -Patient was more lethargic on 6/17 , transfer to progressive unit for closer monitor. Patient was placed on BIPAP.  -Chest x ray with opacity vs atelectasis. Completed antibiotics. Unasyn and Azithro,.  -Appreciate Pulmonary evaluation.  -Respiratory panel negative -Continue with IV solumedrol to BID>  -Will change PPI to BID, add Flonase and Claritin.   2-Dyspnea on exertion; chronic chest pain Troponin negative. EKG no arrhythmia.  Prior Coronary CT 2020 didn't showed significant stenosis.  Cardiology Consulted. Patient symptoms likely related to COPD> No further cardiac evaluation needed.   3-Bacteria vaginosis: Continue with flagyl for 7 days.  Change Clotrimazole to diflucan one time dose.   4-Hypothyroidism: Continue with Synthroid.  History of depression/bipolar/PTSD: Continue with Wellbutrin and Effexor.  GERD: Continue with PPI.  Hyperlipidemia; on statins.   Leukocytosis: Secondary to a  steroid.  Steroid-induced hyperglycemia; SSI. On lantus.  HB A1c 6.8 Increase lantus, Meal coverage.   AKI resolved.  Sleep Apnea; CPAP/   Morbid obesity. BMI 36.4  HTN; on HCTZ, change to lasix.  BP elevated, increase Hydralazine to 50 mg  Tachycardia; Atenol discontinue due to COPD. Discuss with Dr Chase Caller plan to started bisoprolol.  Tobacco use: Counseling provided. Estimated body mass index is 36.97 kg/m as calculated from the following:   Height as of this encounter: 5\' 8"  (1.727 m).   Weight as of this encounter: 110.3 kg.   DVT prophylaxis: Lovenox Code Status: Full code Family Communication: Care discussed with patient. Daughter over phone 6/21 Disposition Plan:  Status is: Inpatient  Remains inpatient appropriate because:IV treatments appropriate due to intensity of illness or inability to take PO  Dispo: The patient is from: Home              Anticipated d/c is to: SNF 2 days              Patient currently is not medically stable to d/c.   Difficult to place patient No        Consultants:  none  Procedures:  ECHO  Antimicrobials:    Subjective: She is breathing better than on admission. Patient still complaints of SOB on minimal exertion, getting to bedside ode.    Objective: Vitals:   08/15/20 0700 08/15/20 0816 08/15/20 1234 08/15/20 1259  BP:   (!) 183/118 (!) 161/107  Pulse:   81 77  Resp:   20   Temp:      TempSrc:      SpO2:  97% 96% 96%  Weight: 110.3 kg     Height:        Intake/Output Summary (Last 24 hours) at 08/15/2020 1355 Last  data filed at 08/15/2020 1200 Gross per 24 hour  Intake 480 ml  Output 200 ml  Net 280 ml    Filed Weights   08/07/20 1850 08/15/20 0700  Weight: 108.9 kg 110.3 kg    Examination:  General exam: NAD, speaking in full sentences.  Respiratory system: BL expiratory wheezing improved.  Cardiovascular system:  S 1, S 2 RRR Gastrointestinal system: BS present, soft, nt Central nervous  system: Alert, follows command Extremities: less LE edema Data Reviewed: I have personally reviewed following labs and imaging studies  CBC: Recent Labs  Lab 08/09/20 0537 08/10/20 0508 08/12/20 0843 08/13/20 0546 08/15/20 0536  WBC 21.1* 13.2* 15.1* 16.5* 16.5*  NEUTROABS  --  11.5*  --   --   --   HGB 13.5 11.6* 13.6 13.1 12.6  HCT 42.2 35.8* 42.3 38.3 39.7  MCV 95.3 93.7 94.2 91.2 95.7  PLT 244 245 278 270 315    Basic Metabolic Panel: Recent Labs  Lab 08/10/20 1820 08/11/20 0523 08/12/20 0843 08/13/20 0546 08/14/20 1041 08/15/20 0536  NA 134* 136 137 137  --  139  K 4.4 4.4 3.8 4.1  --  4.1  CL 89* 94* 96* 101  --  99  CO2 32 32 31 28  --  32  GLUCOSE 214* 238* 173* 212*  --  178*  BUN 21* 25* 25* 28*  --  26*  CREATININE 0.96 1.03* 0.92 0.86  --  0.88  CALCIUM 9.7 9.3 9.1 8.8*  --  9.0  MG  --   --   --  2.2  --   --   PHOS  --   --   --  3.6 3.1 4.3    GFR: Estimated Creatinine Clearance: 90.8 mL/min (by C-G formula based on SCr of 0.88 mg/dL). Liver Function Tests: Recent Labs  Lab 08/09/20 0537  AST 34  ALT 35  ALKPHOS 97  BILITOT 1.5*  PROT 7.5  ALBUMIN 3.9    No results for input(s): LIPASE, AMYLASE in the last 168 hours.  No results for input(s): AMMONIA in the last 168 hours. Coagulation Profile: No results for input(s): INR, PROTIME in the last 168 hours. Cardiac Enzymes: No results for input(s): CKTOTAL, CKMB, CKMBINDEX, TROPONINI in the last 168 hours. BNP (last 3 results) No results for input(s): PROBNP in the last 8760 hours. HbA1C: No results for input(s): HGBA1C in the last 72 hours.  CBG: Recent Labs  Lab 08/14/20 1706 08/14/20 2200 08/15/20 0051 08/15/20 0816 08/15/20 1132  GLUCAP 148* 206* 318* 196* 247*    Lipid Profile: No results for input(s): CHOL, HDL, LDLCALC, TRIG, CHOLHDL, LDLDIRECT in the last 72 hours. Thyroid Function Tests: No results for input(s): TSH, T4TOTAL, FREET4, T3FREE, THYROIDAB in the last 72  hours. Anemia Panel: No results for input(s): VITAMINB12, FOLATE, FERRITIN, TIBC, IRON, RETICCTPCT in the last 72 hours.  Sepsis Labs: Recent Labs  Lab 08/10/20 1951 08/11/20 0523  PROCALCITON <0.10 <0.10  LATICACIDVEN 2.2*  --      Recent Results (from the past 240 hour(s))  Resp Panel by RT-PCR (Flu A&B, Covid) Nasopharyngeal Swab     Status: None   Collection Time: 08/07/20  8:10 PM   Specimen: Nasopharyngeal Swab; Nasopharyngeal(NP) swabs in vial transport medium  Result Value Ref Range Status   SARS Coronavirus 2 by RT PCR NEGATIVE NEGATIVE Final    Comment: (NOTE) SARS-CoV-2 target nucleic acids are NOT DETECTED.  The SARS-CoV-2 RNA is generally detectable in upper  respiratory specimens during the acute phase of infection. The lowest concentration of SARS-CoV-2 viral copies this assay can detect is 138 copies/mL. A negative result does not preclude SARS-Cov-2 infection and should not be used as the sole basis for treatment or other patient management decisions. A negative result may occur with  improper specimen collection/handling, submission of specimen other than nasopharyngeal swab, presence of viral mutation(s) within the areas targeted by this assay, and inadequate number of viral copies(<138 copies/mL). A negative result must be combined with clinical observations, patient history, and epidemiological information. The expected result is Negative.  Fact Sheet for Patients:  EntrepreneurPulse.com.au  Fact Sheet for Healthcare Providers:  IncredibleEmployment.be  This test is no t yet approved or cleared by the [place] FDA and  has been authorized for detection and/or diagnosis of SARS-CoV-2 by FDA under an Emergency Use Authorization (EUA). This EUA will remain  in effect (meaning this test can be used) for the duration of the COVID-19 declaration under Section 564(b)(1) of the Act, 21 U.S.C.section 360bbb-3(b)(1),  unless the authorization is terminated  or revoked sooner.       Influenza A by PCR NEGATIVE NEGATIVE Final   Influenza B by PCR NEGATIVE NEGATIVE Final    Comment: (NOTE) The Xpert Xpress SARS-CoV-2/FLU/RSV plus assay is intended as an aid in the diagnosis of influenza from Nasopharyngeal swab specimens and should not be used as a sole basis for treatment. Nasal washings and aspirates are unacceptable for Xpert Xpress SARS-CoV-2/FLU/RSV testing.  Fact Sheet for Patients: EntrepreneurPulse.com.au  Fact Sheet for Healthcare Providers: IncredibleEmployment.be  This test is not yet approved or cleared by the [place] FDA and has been authorized for detection and/or diagnosis of SARS-CoV-2 by FDA under an Emergency Use Authorization (EUA). This EUA will remain in effect (meaning this test can be used) for the duration of the COVID-19 declaration under Section 564(b)(1) of the Act, 21 U.S.C. section 360bbb-3(b)(1), unless the authorization is terminated or revoked.  Performed at [facility], [place] [address] [place], [place] [address]   MRSA PCR Screening     Status: Abnormal   Collection Time: 08/11/20  9:35 AM  Result Value Ref Range Status   MRSA by PCR POSITIVE (A) NEGATIVE Final    Comment:        The GeneXpert MRSA Assay (FDA approved for NASAL specimens only), is one component of a comprehensive MRSA colonization surveillance program. It is not intended to diagnose MRSA infection nor to guide or monitor treatment for MRSA infections. CRITICAL VALUE NOTED.  VALUE IS CONSISTENT WITH PREVIOUSLY REPORTED AND CALLED VALUE. POLLET E. ON 08/11/2020 @ 1141 BY MECIAL J. Performed at [facility], [place] [address], [place], [place] [address]   Respiratory (~20 pathogens) panel by PCR     Status: None   Collection Time: 08/12/20 12:25 PM   Specimen: Nasopharyngeal Swab; Respiratory  Result Value Ref  Range Status   Adenovirus NOT DETECTED NOT DETECTED Final   Coronavirus 229E NOT DETECTED NOT DETECTED Final    Comment: (NOTE) The Coronavirus on the Respiratory Panel, DOES NOT test for the novel  Coronavirus (2019 nCoV)    Coronavirus HKU1 NOT DETECTED NOT DETECTED Final   Coronavirus NL63 NOT DETECTED NOT DETECTED Final   Coronavirus OC43 NOT DETECTED NOT DETECTED Final   Metapneumovirus NOT DETECTED NOT DETECTED Final   Rhinovirus / Enterovirus NOT DETECTED NOT DETECTED Final   Influenza A NOT DETECTED NOT DETECTED Final   Influenza B  NOT DETECTED NOT DETECTED Final   Parainfluenza Virus 1 NOT DETECTED NOT DETECTED Final   Parainfluenza Virus 2 NOT DETECTED NOT DETECTED Final   Parainfluenza Virus 3 NOT DETECTED NOT DETECTED Final   Parainfluenza Virus 4 NOT DETECTED NOT DETECTED Final   Respiratory Syncytial Virus NOT DETECTED NOT DETECTED Final   Bordetella pertussis NOT DETECTED NOT DETECTED Final   Bordetella Parapertussis NOT DETECTED NOT DETECTED Final   Chlamydophila pneumoniae NOT DETECTED NOT DETECTED Final   Mycoplasma pneumoniae NOT DETECTED NOT DETECTED Final    Comment: Performed at [facility], [place] [address] [place],  [address]          Radiology Studies: No results found.      Scheduled Meds:  acyclovir  400 mg Oral BID   atorvastatin  20 mg Oral Daily   bisoprolol  5 mg Oral Daily   budesonide (PULMICORT) nebulizer solution  0.5 mg Nebulization BID   buPROPion  150 mg Oral Daily   chlorhexidine  15 mL Mouth Rinse BID   Chlorhexidine Gluconate Cloth  6 each Topical Daily   enoxaparin (LOVENOX) injection  40 mg Subcutaneous Q24H   furosemide  20 mg Oral Daily   guaiFENesin  1,200 mg Oral BID   hydrALAZINE  50 mg Oral Q8H   insulin aspart  0-15 Units Subcutaneous TID WC   insulin aspart  0-5 Units Subcutaneous QHS   insulin aspart  5 Units Subcutaneous TID WC   insulin glargine  15 Units Subcutaneous Daily   ipratropium-albuterol   3 mL Nebulization TID   levothyroxine  25 mcg Oral Q0600   mouth rinse  15 mL Mouth Rinse q12n4p   methylPREDNISolone (SOLU-MEDROL) injection  40 mg Intravenous Q12H   metroNIDAZOLE  500 mg Oral Q12H   montelukast  10 mg Oral QHS   mupirocin cream   Topical Daily   mupirocin ointment  1 application Nasal BID   nicotine  14 mg Transdermal Daily   olopatadine  1 drop Both Eyes BID   pantoprazole  40 mg Oral Daily   pregabalin  150 mg Oral QHS   QUEtiapine  50 mg Oral QHS   saccharomyces boulardii  250 mg Oral BID   venlafaxine XR  150 mg Oral Q breakfast   Continuous Infusions:  sodium chloride 10 mL/hr at 08/12/20 1739     LOS: 8 days    Time spent: 35 minutes.     Elmarie Shiley, MD [facility]   If 7PM-7AM, please contact night-coverage [email or web address]  08/15/2020, 1:55 PM

## 2020-08-15 NOTE — Plan of Care (Signed)

## 2020-08-15 NOTE — Consult Note (Signed)
Cardiology Consultation:   Patient ID: Joan [name] MRN: [id number]; DOB: 03/26/1960  Admit date: 08/07/2020 Date of Consult: 08/15/2020  PCP:  [name], MD   [facility] Providers Cardiologist:  Dr. Gwenlyn Found   Patient Profile:   Joan [name] is a 60 y.o. female with a hx of HTN, dyslipidemia, active tobacco abuse, oxygen dependent COPD 3LNC, obesity, anxiety, depression, bipolar, PTSD, type 2 DM, OSA on CPAP, hypothyroidism, GERD, who is currently admitted for COPD exacerbation. Cardiology is consulted today for chronic chest pain at the request of Dr. Tyrell Antonio.   History of Present Illness:   Joan [name] was evaluated by Dr. Gwenlyn Found in 2019 for frequent chest pain complaints. She had a negative stress Myoveiw on 08/19/2017.  She was last see n in the office on 12/09/2018, recommended smoking cessation,  due to continued complaints, she subsequently had coronary CTA on 01/05/2019, which showed: coronary calcium score of 727. This was 99th percentile for age and sex matched control. Normal coronary origin with right dominance.There is moderate, diffuse calcification in the proximal to mid LAD and LCX. The plaque is heavily calcified which can cause blooming artifact and over-estimation of stenosis. CT FFR analysis did not show any significant stenosis.   She presented to ER on 08/07/20 for worsening dyspnea with cough, wheezing over the past week. She was noted continue to smoke. She was admitted for COPD exacerbation, started on bronchodilators, steroid, and azithromycin. She had subsequent development of AKI where her HCTZ was held; bacterial vaginosis where she was started on flagyl and Clotrimazole for candida infection. Pulmonology critical care was consulted , question if there is CHF component. Echo from 08/11/20 showed LVEF 60-65%, no RWMA, LV diastolic parameters were normal.  RV normal, no valvular disease. Due to chronic chest pain, Hs trop was checked and negative  x1 on 08/14/20. EKG from 08/10/20 is SR without acute ST-T changes. Cardiology is consulted for further input.   During encounter, patient is eating breakfast. She states she saw Dr Gwenlyn Found in 2019 for similar complaints, had a lot test that were negative, but underwent cardiac cath in 2020, where she was found to have a blockage but no intervention was done due to her insurance would not cover it. She states she had a previous cardiac cath before 2017 when she lived in [place] and was told she has blockage as well. She states " all the other testing are not finding her actual problems". She states she had ongoing chest pain for the past several years. Pain is located on her left sided of chest, beneath the breast area, feels like tight band and pressure. Pain occurs randomly throughout the day, no triggered by exertion nor relieved by resting. Apply pressure to her left side of her chest sometimes helps relieve the pain. Sometimes she experience dizziness, lightheadedness, bilateral hands tingling sensation with the episodes. She states her pain has lasted a few weeks now and not resolving. She states she is very winded and can hardly walk to her bedside commode without getting dyspnea. She states she had leg swelling as well, but it has resolved with Lasix. She is very anxious with pressured speech during encounter. She states she needs to get to the bottom of this and needs to know why her chest pain is so severe. She believed only cardiac cath would give her the answer. She continue to smokes 1 PPD, felt her COPD is not the problems causing her symptoms, is complaint using CPAP at night.  Past Medical History:  Diagnosis Date   Acute respiratory failure with hypoxia ([place])    Alcoholism and drug addiction in family    Anxiety    Appendicitis    Arthritis    "knees, hips, lower back" (09/25/2016)   Asthma    Bipolar disorder ([place])    Chronic bronchitis ([place])    "all the time" (09/25/2016)   Chronic  leg pain    RLE   Chronic lower back pain    COPD (chronic obstructive pulmonary disease) ([place])    Depression    Diabetes mellitus without complication (HCC)    Diabetic neuropathy ([place])    Early cataracts, bilateral    Emphysema of lung ([place])    Generalized anxiety disorder 02/09/2020   GERD (gastroesophageal reflux disease)    Headache    Hepatitis C    "treated back in 2001-2002"   High cholesterol    Hypertension    Hypothyroidism    Incarcerated ventral hernia 04/04/2017   Mood disorder in conditions classified elsewhere 02/09/2020   On home oxygen therapy    "2L; at nighttime" (04/04/2017)   OSA on CPAP    CPAP with Oxygen 2 liters   Paranoid ([place])    Pneumonia    "all the time" (09/25/2016)   Positive PPD    with negative chest x ray   PTSD (post-traumatic stress disorder) 02/09/2020   Tachycardia    patient reports with exertion ; denies any other conditions    Thyroid disease    Wears glasses    Wears partial dentures     Past Surgical History:  Procedure Laterality Date   BACK SURGERY     BIOPSY  11/24/2018   Procedure: BIOPSY;  Surgeon: Thornton Park, MD;  Location: [facility];  Service: Gastroenterology;;   CARDIAC CATHETERIZATION     COLONOSCOPY     COLONOSCOPY WITH PROPOFOL N/A 11/24/2018   Procedure: COLONOSCOPY WITH PROPOFOL;  Surgeon: Thornton Park, MD;  [facility]: [facility];  Service: Gastroenterology;  Laterality: N/A;   CYST EXCISION     removal of cyst in sinuses   DILATION AND CURETTAGE OF UTERUS     ESOPHAGOGASTRODUODENOSCOPY (EGD) WITH PROPOFOL N/A 11/24/2018   Procedure: ESOPHAGOGASTRODUODENOSCOPY (EGD) WITH PROPOFOL;  Surgeon: Thornton Park, MD;  [facility];  Service: Gastroenterology;  Laterality: N/A;   FOOT SURGERY Bilateral    bone removed from 4th and 5 th toes and put in pin then took pin out   [place]  ~ 2014/2015   "removed mesh & infection"   [place] N/A  04/04/2017   Procedure: LAPAROSCOPIC INCISIONAL HERNIA REPAIR WITH MESH;  Surgeon: Fanny Skates, MD;  Location: [facility];  Service: General;  Laterality: N/A;   LACERATION REPAIR Right    repair wrist artery from laceration from ice skate   LAPAROSCOPIC APPENDECTOMY N/A 03/31/2019   Procedure: APPENDECTOMY LAPAROSCOPIC;  Surgeon: Ralene Ok, MD;  Location: [facility];  Service: General;  Laterality: N/A;   LAPAROSCOPIC APPENDECTOMY N/A 07/09/2019   Procedure: APPENDECTOMY LAPAROSCOPIC;  Surgeon: Ralene Ok, MD;  Location: [facility];  Service: General;  Laterality: N/A;   LAPAROSCOPIC INCISIONAL / UMBILICAL / [place]  04/04/2017   LAPAROSCOPIC INCISIONAL HERNIA REPAIR WITH MESH   LIPOMA EXCISION Right    fattty lipoma in neck   NASAL SEPTUM SURGERY     POSTERIOR LUMBAR FUSION  2015/2016 X 2   "added rods and screws"   SINOSCOPY  TOTAL HIP ARTHROPLASTY Right 02/05/2017   Procedure: RIGHT TOTAL HIP ARTHROPLASTY;  Surgeon: [name], MD;  [facility]: [facility];  Service: Orthopedics;  Laterality: Right;   TOTAL HIP ARTHROPLASTY Left 06/18/2017   Procedure: LEFT TOTAL HIP ARTHROPLASTY;  Surgeon: [name], MD;  [facility];  Service: Orthopedics;  Laterality: Left;   TOTAL HIP REVISION Left 01/26/2019   Procedure: TOTAL HIP REVISION;  Surgeon: Paralee Cancel, MD;  Location: [facility];  Service: Orthopedics;  Laterality: Left;  2 hrs   TUBAL LIGATION     UMBILICAL HERNIA REPAIR  ~ 2013   w/mesh   UPPER GI ENDOSCOPY       Home Medications:  Prior to Admission medications   Medication Sig Start Date End Date Taking? Authorizing Provider  acyclovir (ZOVIRAX) 400 MG tablet TAKE 1 TABLET BY MOUTH  TWICE DAILY 03/29/20  Yes [name], MD  albuterol (PROVENTIL) (2.5 MG/3ML) 0.083% nebulizer solution Take 3 mLs (2.5 mg total) by nebulization 2 (two) times daily. And every 6 hours as needed.  DX: J44.9 03/31/20  Yes [name], MD  albuterol (VENTOLIN HFA) 108 (90  Base) MCG/ACT inhaler Inhale 2 puffs into the lungs every 6 (six) hours as needed for wheezing or shortness of breath. Patient taking differently: Inhale 2 puffs into the lungs in the morning and at bedtime. 07/24/20  Yes [name], MD  atenolol (TENORMIN) 25 MG tablet TAKE 1 TABLET BY MOUTH  DAILY 03/08/20  Yes [name], MD  atorvastatin (LIPITOR) 20 MG tablet TAKE 1 TABLET BY MOUTH  DAILY 03/08/20  Yes [name], MD  Budeson-Glycopyrrol-Formoterol (BREZTRI AEROSPHERE) 160-9-4.8 MCG/ACT AERO Inhale 2 puffs into the lungs in the morning and at bedtime. 08/03/20  Yes [name], MD  budesonide-formoterol (SYMBICORT) 160-4.5 MCG/ACT inhaler Inhale 2 puffs into the lungs 2 (two) times daily.   Yes [provider]  buPROPion (WELLBUTRIN XL) 150 MG 24 hr tablet Take 1 tablet (150 mg total) by mouth daily. 05/25/20  Yes [name], MD  Camphor-Menthol-Methyl Sal 3.03-02-08 % PTCH Apply 1 patch topically daily as needed (pain).   Yes [provider]  cyclobenzaprine (FLEXERIL) 10 MG tablet TAKE 1 TABLET BY MOUTH AT  BEDTIME 07/17/20  Yes [name], MD  diazepam (VALIUM) 5 MG tablet Take oral route 45 minutes prior to procedure. Repeat PRN 20 minutes. 04/25/20  Yes [provider]  hydrochlorothiazide (HYDRODIURIL) 25 MG tablet TAKE 1 TABLET BY MOUTH  DAILY 03/08/20  Yes [name], MD  Ipratropium-Albuterol (COMBIVENT RESPIMAT) 20-100 MCG/ACT AERS respimat Inhale 1 puff into the lungs every 6 (six) hours as needed for wheezing. Patient taking differently: Inhale 1 puff into the lungs every 6 (six) hours as needed for wheezing or shortness of breath. 04/17/18  Yes [name], MD  levothyroxine (SYNTHROID) 25 MCG tablet TAKE 1 TABLET BY MOUTH  DAILY BEFORE BREAKFAST 03/17/20  Yes [name], MD  montelukast (SINGULAIR) 10 MG tablet TAKE 1 TABLET BY MOUTH AT  BEDTIME 03/13/20  Yes [name], MD  naproxen (NAPROSYN) 500  MG tablet TAKE 1 TABLET BY MOUTH  TWICE DAILY WITH A MEAL Patient taking differently: Take 500 mg by mouth 2 (two) times daily as needed for moderate pain. 01/24/20  Yes [name], MD  nystatin-triamcinolone ointment [facility]) Apply 1 application topically 2 (two) times daily as needed (applied to skin folds/groins/under breast skin irritation rash.). Patient taking differently: Apply 1 application  topically 2 (two) times daily as needed (to skin folds/groin/under breast skin- for irritation/rashes). 09/08/17  Yes [name], MD  pantoprazole (PROTONIX) 40 MG tablet TAKE 1 TABLET BY MOUTH  DAILY 03/08/20  Yes [name], MD  PATADAY 0.2 % SOLN Apply 1 drop to eye daily. 08/20/19  Yes [provider]  polyethylene glycol (MIRALAX / GLYCOLAX) 17 g packet Take 17 g by mouth daily as needed for mild constipation. 04/14/19  Yes Meuth, Brooke A, PA-C  pregabalin (LYRICA) 150 MG capsule TAKE 1 CAPSULE BY MOUTH AT  BEDTIME 07/17/20  Yes [name], MD  QUEtiapine (SEROQUEL) 50 MG tablet Take 1 tablet (50 mg total) by mouth at bedtime. 05/25/20  Yes [name], MD  roflumilast (DALIRESP) 500 MCG TABS tablet Take 1 tablet (500 mcg total) by mouth daily. 11/06/18  Yes [name], NP  traZODone (DESYREL) 100 MG tablet TAKE 1 TABLET BY MOUTH AT  BEDTIME 01/24/20  Yes [name], MD  venlafaxine XR (EFFEXOR-XR) 150 MG 24 hr capsule TAKE 1 CAPSULE BY MOUTH  DAILY WITH BREAKFAST 01/24/20  Yes [name], MD  acetaminophen (TYLENOL) 500 MG tablet Take 2 tablets (1,000 mg total) by mouth every 8 (eight) hours as needed for mild pain. Patient not taking: No sig reported 04/14/19   Meuth, Blaine Hamper, PA-C  [facility] ARTHRITIS PAIN RELIEF EX) Apply 1 patch topically daily as needed (to painful sites- remove as directed).     [provider]  OXYGEN Inhale 2-3 L/min into the lungs See admin instructions. Inhale  2-3 L/min of oxygen  into the lungs w/CPAP at bedtime and as needed for shortness of breath with "strenuous activity" during the day    [provider]    Inpatient Medications: Scheduled Meds:  acyclovir  400 mg Oral BID   amoxicillin-clavulanate  1 tablet Oral Q12H   atorvastatin  20 mg Oral Daily   bisoprolol  5 mg Oral Daily   budesonide (PULMICORT) nebulizer solution  0.5 mg Nebulization BID   buPROPion  150 mg Oral Daily   chlorhexidine  15 mL Mouth Rinse BID   Chlorhexidine Gluconate Cloth  6 each Topical Daily   enoxaparin (LOVENOX) injection  40 mg Subcutaneous Q24H   furosemide  20 mg Oral Daily   guaiFENesin  1,200 mg Oral BID   hydrALAZINE  50 mg Oral Q8H   insulin aspart  0-15 Units Subcutaneous TID WC   insulin aspart  4 Units Subcutaneous TID WC   insulin glargine  15 Units Subcutaneous Daily   ipratropium-albuterol  3 mL Nebulization TID   levothyroxine  25 mcg Oral Q0600   mouth rinse  15 mL Mouth Rinse q12n4p   methylPREDNISolone (SOLU-MEDROL) injection  40 mg Intravenous Q12H   metroNIDAZOLE  500 mg Oral Q12H   montelukast  10 mg Oral QHS   mupirocin ointment  1 application Nasal BID   nicotine  14 mg Transdermal Daily   olopatadine  1 drop Both Eyes BID   pantoprazole  40 mg Oral Daily   pregabalin  150 mg Oral QHS   QUEtiapine  50 mg Oral QHS   saccharomyces boulardii  250 mg Oral BID   venlafaxine XR  150 mg Oral Q breakfast   Continuous Infusions:  sodium chloride 10 mL/hr at 08/12/20 1739   PRN Meds: sodium chloride, acetaminophen **OR** acetaminophen, albuterol, ALPRAZolam, Camphor-Menthol-Methyl Sal, hydrALAZINE, magnesium hydroxide, ondansetron **OR** ondansetron (ZOFRAN) IV, polyethylene glycol, traZODone, witch  hazel-glycerin  Allergies:    Allergies  Allergen Reactions   Keflex [Cephalexin] Rash and Other (See Comments)    Has tolerated amoxicillin since had keflex   Prednisone Other (See Comments)    "counter reacts"   Shellfish Allergy Shortness Of  Breath, Nausea And Vomiting and Other (See Comments)    Stomach cramps   Tetracyclines & Related Anaphylaxis, Swelling and Other (See Comments)    Throat swelling requiring hospitalization   Dilaudid [Hydromorphone Hcl] Other (See Comments)    "Lethargy"   Incruse Ellipta [Umeclidinium Bromide] Nausea Only   Other    Tuberculin Tests Other (See Comments)    False postive    Social History:   Social History   Socioeconomic History   Marital status: Widowed    Spouse name: Not on file   Number of children: 3   Years of education: Not on file   Highest education level: Not on file  Occupational History   Not on file  Tobacco Use   Smoking status: Every Day    Packs/day: 0.50    Years: 44.00    Pack years: 22.00    Types: Cigarettes   Smokeless tobacco: Never   Tobacco comments:    down to 0.5ppd 06/30/20/jm  Vaping Use   Vaping Use: Never used  Substance and Sexual Activity   Alcohol use: Not Currently   Drug use: Not Currently    Types: "Crack" cocaine    Comment:  "nothing since 01/12/2012"   Sexual activity: Not Currently    Partners: Male  Other Topics Concern   Not on file  Social History Narrative   Not on file   Social Determinants of Health   Financial Resource Strain: High Risk   Difficulty of Paying Living Expenses: Hard  Food Insecurity: Food Insecurity Present   Worried About Running Out of Food in the Last Year: Sometimes true   Ran Out of Food in the Last Year: Sometimes true  Transportation Needs: No Transportation Needs   Lack of Transportation (Medical): No   Lack of Transportation (Non-Medical): No  Physical Activity: Not on file  Stress: Stress Concern Present   Feeling of Stress : Very much  Social Connections: Not on file  Intimate Partner Violence: Not on file    Family History:    Family History  Problem Relation Age of Onset   Diabetes Mother    Hypertension Mother    Hypertension Father    Cancer Father        lung     ROS:   Constitutional: fatigue, decreased activity tolerance  Eyes: Denied vision change or loss Ears/Nose/Mouth/Throat: Denied ear ache, sore throat, sinus pain Cardiovascular: see HPI  Respiratory: see HPI  Gastrointestinal: Denied nausea, vomiting Genital/Urinary: Urinary frequency Musculoskeletal: Denied muscle ache, joint pain Skin: Denied rash, wound Neuro: intermittent dizziness  Psych: history of depression/anxiety, bipolar, PTSD  Endocrine: history of diabetes     Physical Exam/Data:   Vitals:   08/14/20 2256 08/15/20 0524 08/15/20 0700 08/15/20 0816  BP:  139/77    Pulse: 83 71    Resp: 18 18    Temp:  98.1 F (36.7 C)    TempSrc:      SpO2: 93% 95%  97%  Weight:   110.3 kg   Height:        Intake/Output Summary (Last 24 hours) at 08/15/2020 0942 Last data filed at 08/15/2020 0850 Gross per 24 hour  Intake 120 ml  Output 700 ml  Net -580 ml   Last 3 Weights 08/15/2020 08/07/2020 06/30/2020  Weight (lbs) 243 lb 2.7 oz 240 lb 218 lb 12.8 oz  Weight (kg) 110.3 kg 108.863 kg 99.247 kg     Body mass index is 36.97 kg/m.   Vitals:  Vitals:   08/15/20 0524 08/15/20 0816  BP: 139/77   Pulse: 71   Resp: 18   Temp: 98.1 F (36.7 C)   SpO2: 95% 97%   General Appearance: In no apparent distress, sitting in bed, eating breakfast  HEENT: Normocephalic, atraumatic. EOMs intact.  Neck: Supple, trachea midline, no JVDs Cardiovascular: Regular rate and rhythm, normal S1-S2,  no murmur/rub/gallop Respiratory: DOE,  lungs sounds with expiratory wheezing throughout bilaterally, no use of accessory muscles. On 3LNC Gastrointestinal: Bowel sounds positive, abdomen soft, non-tender, non-distended.  Extremities: Able to move all extremities in bed without difficulty, no edema/cyanosis/clubbing Genitourinary: genital exam not performed Musculoskeletal: Normal muscle bulk and tone, muscle strength 5/5 throughout, no limited range of motion, no swollen or erythematous joints Skin:  Intact, warm, dry. No rashes or petechiae noted in exposed areas.  Neurologic: Alert, oriented to person, place and time. Fluent speech, no facial droop, no cognitive deficit, no gross focal neuro deficit Psychiatric: Anxious, pressure speech    EKG:  The EKG was personally reviewed and demonstrates:  EKG from 08/10/20 SR without acute ST-T changes   Telemetry:  Telemetry was personally reviewed and demonstrates:  Sinus rhythm with rate of 80s, artifacts, intermittent sinus tachycardia   Relevant CV Studies:  Echo from 08/11/20:   1. Left ventricular ejection fraction, by estimation, is 60 to 65%. The  left ventricle has normal function. The left ventricle has no regional  wall motion abnormalities. Left ventricular diastolic parameters were  normal.   2. Right ventricular systolic function is normal. The right ventricular  size is normal.   3. The mitral valve is grossly normal. No evidence of mitral valve  regurgitation.   4. The aortic valve is grossly normal. Aortic valve regurgitation is not  visualized. No aortic stenosis is present.    Coronary CTA 01/05/2019:  1. Coronary calcium score of 727. This was 99th percentile for age and sex matched control.   2. Normal coronary origin with right dominance.   3. There is moderate, diffuse calcification in the proximal to mid LAD and LCX. The plaque is heavily calcified which can cause blooming artifact and over-estimation of stenosis.   4.  Will send study for FFRct.  1.  CT FFR analysis didn't show any significant stenosis.   2. Recommend aggressive risk factor modification including high potency statin.   Stress Myoveiw on 08/19/2017:  Nuclear stress EF: 60%. Mild inferoseptal wall hypokinesis There was no ST segment deviation noted during stress. This is a low risk study. No ischemia identified. The study is normal.    Laboratory Data:  High Sensitivity Troponin:   Recent Labs  Lab 08/07/20 1943  08/08/20 0000 08/10/20 1951 08/14/20 1654  TROPONINIHS 3 2 3 4      Chemistry Recent Labs  Lab 08/12/20 0843 08/13/20 0546 08/15/20 0536  NA 137 137 139  K 3.8 4.1 4.1  CL 96* 101 99  CO2 31 28 32  GLUCOSE 173* 212* 178*  BUN 25* 28* 26*  CREATININE 0.92 0.86 0.88  CALCIUM 9.1 8.8* 9.0  GFRNONAA >60 >60 >60  ANIONGAP 10 8 8     Recent Labs  Lab 08/09/20 0537  PROT 7.5  ALBUMIN 3.9  AST 34  ALT 35  ALKPHOS 97  BILITOT 1.5*   Hematology Recent Labs  Lab 08/12/20 0843 08/13/20 0546 08/15/20 0536  WBC 15.1* 16.5* 16.5*  RBC 4.49 4.20 4.15  HGB 13.6 13.1 12.6  HCT 42.3 38.3 39.7  MCV 94.2 91.2 95.7  MCH 30.3 31.2 30.4  MCHC 32.2 34.2 31.7  RDW 14.6 14.8 15.2  PLT 278 270 269   BNPNo results for input(s): BNP, PROBNP in the last 168 hours.  DDimer  Recent Labs  Lab 08/14/20 1356  DDIMER 0.33     Radiology/Studies:  ECHOCARDIOGRAM COMPLETE  Result Date: 08/11/2020    ECHOCARDIOGRAM REPORT   Patient Name:   Joan [name] Date of Exam: 08/11/2020 Medical Rec #:  [id number]   Height:       68.0 in Accession #:    [id number]  Weight:       240.0 lb Date of Birth:  1961/02/06    BSA:          2.208 m Patient Age:    12 years    BP:           152/82 mmHg Patient Gender: F           HR:           77 bpm. Exam Location:  Inpatient Procedure: 2D Echo, Cardiac Doppler and Color Doppler Indications:    Dyspnea R06.00  History:        Patient has prior history of Echocardiogram examinations, most                 recent 09/27/2016. COPD; Risk Factors:Hypertension, Diabetes,                 Dyslipidemia, Sleep Apnea and GERD. Hypothyroidism. Alcoholism.  Sonographer:    Jonelle Sidle Dance Referring Phys: [id number] BELKYS A REGALADO IMPRESSIONS  1. Left ventricular ejection fraction, by estimation, is 60 to 65%. The left ventricle has normal function. The left ventricle has no regional wall motion abnormalities. Left ventricular diastolic parameters were normal.  2. Right ventricular systolic  function is normal. The right ventricular size is normal.  3. The mitral valve is grossly normal. No evidence of mitral valve regurgitation.  4. The aortic valve is grossly normal. Aortic valve regurgitation is not visualized. No aortic stenosis is present. FINDINGS  Left Ventricle: Left ventricular ejection fraction, by estimation, is 60 to 65%. The left ventricle has normal function. The left ventricle has no regional wall motion abnormalities. The left ventricular internal cavity size was normal in size. There is  no left ventricular hypertrophy. Left ventricular diastolic parameters were normal. Right Ventricle: The right ventricular size is normal. Right vetricular wall thickness was not well visualized. Right ventricular systolic function is normal. Left Atrium: Left atrial size was normal in size. Right Atrium: Right atrial size was normal in size. Pericardium: There is no evidence of pericardial effusion. Mitral Valve: The mitral valve is grossly normal. No evidence of mitral valve regurgitation. Tricuspid Valve: The tricuspid valve is grossly normal. Tricuspid valve regurgitation is trivial. Aortic Valve: The aortic valve is grossly normal. Aortic valve regurgitation is not visualized. No aortic stenosis is present. Pulmonic Valve: The pulmonic valve was grossly normal. Pulmonic valve regurgitation is not visualized. Aorta: The aortic root and ascending aorta are structurally normal, with no evidence of dilitation. IAS/Shunts: The atrial septum is grossly normal.  LEFT VENTRICLE PLAX 2D LVIDd:         4.40 cm Diastology LVIDs:  2.90 cm LV e' medial:    8.16 cm/s LV PW:         1.20 cm LV E/e' medial:  11.9 LV IVS:        0.80 cm LV e' lateral:   10.60 cm/s                        LV E/e' lateral: 9.1  RIGHT VENTRICLE             IVC RV Basal diam:  2.90 cm     IVC diam: 2.30 cm RV S prime:     15.30 cm/s TAPSE (M-mode): 2.6 cm LEFT ATRIUM             Index       RIGHT ATRIUM           Index LA diam:         2.80 cm 1.27 cm/m  RA Area:     13.40 cm LA Vol (A2C):   55.1 ml 24.95 ml/m RA Volume:   26.30 ml  11.91 ml/m LA Vol (A4C):   38.6 ml 17.48 ml/m LA Biplane Vol: 46.8 ml 21.19 ml/m  AORTIC VALVE LVOT Vmax:   100.60 cm/s LVOT Vmean:  62.750 cm/s LVOT VTI:    0.198 m  AORTA Ao Asc diam: 3.40 cm MITRAL VALVE MV Area (PHT): 3.85 cm    SHUNTS MV Decel Time: 197 msec    Systemic VTI: 0.20 m MV E velocity: 96.70 cm/s MV A velocity: 92.40 cm/s MV E/A ratio:  1.05 Mertie Moores MD Electronically signed by Mertie Moores MD Signature Date/Time: 08/11/2020/5:13:58 PM    Final      Assessment and Plan:   Chronic atypical chest pain  - presented with dyspnea, wheezing, cough, years duration of left chest pain/band feeling/pressure with no specific triggers, last weeks long, improves with pressure application to chest  - negative stress Myoveiw on 2019 and Coronary CTA on 2020 - Hs Trop negative x1, D dimer negative  - EKG from 08/10/20 without acute ischemic changes - Echo 08/11/20 no RWMA, EF preserved  - self reports she had cardiac cath before 2017 in [place] and 2020 by Dr Gwenlyn Found, unable to find cath reports, she states she was found with coronary blockage but no intervention was done due to insurance would not cover - LDL 58 from 03/07/2020 and Hgb A1C 6.8% on 08/09/20, fair control  - discussed smoking cessation is absolute needed for risk reduction  - clinical picture not consistent with ACS, COPD exacerbation is largely contributing to her manifestation, patient insist cardiac cath workup and felt this is the only method to tell if her "blockage" are worsened, will defer to MD on final decision upon patient evaluation   Acute on chronic hypoxic respiratory failure  COPD exacerbation  - still quite wheezing and symptomatic with DOE, managed per PCCM/IM  HTN - on atenolol 25mg , HCTZ 25mg  at home - HCTZ and atenolol were discontinued by IM due to AKI and COPD,  currently on hydralazine 50mg  TID,  bisoprolol 5mg  (cardioselective for COPD), and lasix 20mg   - BP overall elevated, possible due to steroid use, may consider up-titrate hydralazine to 75mg  TID    Dyslipidemia - continue statin   Hypothyroidism: consider repeat TSH  Type 2 DM with hyperglycemia: A1C 6.8% 08/09/20, fair control, hyperglycemia due to steroid use  GERD: on PPI  OSA: compliant with CPAP nightly  Anxiety with depression/Bipolar /PTSD: on multiple  PSY meds, still anxious  Tobacco abuse : continue smoke 1 PPD, emphasized smoking cessation  - managed per PCCM/IM      Risk Assessment/Risk Scores:   HEAR Score (for undifferentiated chest pain):  HEAR Score: 3{     For questions or updates, please contact [place] Please consult [email or web address] for contact info under    Signed, Margie Billet, NP  08/15/2020 9:42 AM

## 2020-08-15 NOTE — Progress Notes (Signed)
Physical Therapy Treatment Patient Details Name: Joan [name] MRN: [id number] DOB: 1960/09/09 Today's Date: 08/15/2020    History of Present Illness Patient is 60 y.o. female admitted on 08/07/2020 with COPD exacerbation with PMH significant for HTN, hypothyroidism, GERD, COPD, DM, chronic pain, anxiety, depression, PTSD, bipolar d/o, smoker, Lt THR on 01/26/19, s/p laproscopic appendectomy on 03/31/19 and OA.    PT Comments    Pt assisted with ambulating in hallway short distance and requires increased time.  Pt reports she fatigues quickly and becomes SOB with any activity.  Pt reports uncertainty regarding d/c plan.  Hopeful for SNF however reports she may have to return to her brother's home.  No recent TOC notes  ??   Follow Up Recommendations  SNF     Equipment Recommendations  None recommended by PT    Recommendations for Other Services       Precautions / Restrictions Precautions Precautions: Fall Precaution Comments: 3L O2 baseline    Mobility  Bed Mobility Overal bed mobility: Modified Independent                  Transfers Overall transfer level: Needs assistance Equipment used: Rolling walker (2 wheeled) Transfers: Sit to/from Stand Sit to Stand: Supervision         General transfer comment: good safety awareness  Ambulation/Gait Ambulation/Gait assistance: Supervision Gait Distance (Feet): 80 Feet Assistive device: Rolling walker (2 wheeled) Gait Pattern/deviations: Step-through pattern;Decreased stride length;Trunk flexed Gait velocity: decreased   General Gait Details: requires increased time, cues for decreasing talking for conserving endurance/respiratory status, required occasional short standing rest breaks;  pt on 3L O2 at baseline and ambulated on 3L with SPO2 98-100%   Stairs             Wheelchair Mobility    Modified Rankin (Stroke Patients Only)       Balance                                             Cognition Arousal/Alertness: Awake/alert Behavior During Therapy: WFL for tasks assessed/performed Overall Cognitive Status: Within Functional Limits for tasks assessed                                 General Comments: very talkative      Exercises      General Comments        Pertinent Vitals/Pain Pain Assessment: No/denies pain    Home Living                      Prior Function            PT Goals (current goals can now be found in the care plan section) Progress towards PT goals: Progressing toward goals    Frequency    Min 3X/week      PT Plan Current plan remains appropriate    Co-evaluation              AM-PAC PT "6 Clicks" Mobility   Outcome Measure  Help needed turning from your back to your side while in a flat bed without using bedrails?: A Little Help needed moving from lying on your back to sitting on the side of a flat bed without using bedrails?: A Little Help needed moving to and from  a bed to a chair (including a wheelchair)?: A Little Help needed standing up from a chair using your arms (e.g., wheelchair or bedside chair)?: A Little Help needed to walk in hospital room?: A Little Help needed climbing 3-5 steps with a railing? : A Lot 6 Click Score: 17    End of Session Equipment Utilized During Treatment: Oxygen Activity Tolerance: Patient limited by fatigue Patient left: in bed;with call bell/phone within reach;with family/visitor present;with bed alarm set   PT Visit Diagnosis: Muscle weakness (generalized) (M62.81);Difficulty in walking, not elsewhere classified (R26.2)     Time: 1901-2224 PT Time Calculation (min) (ACUTE ONLY): 23 min  Charges:  $Gait Training: 8-22 mins                     Arlyce Dice, DPT Acute Rehabilitation Services Pager: [phone] Office: [phone]  Trena Platt 08/15/2020, 4:23 PM

## 2020-08-15 NOTE — Consult Note (Signed)
Joan [name] Nurse Consult Note: Reason for Consult: Consult requested for bilat feet. Wound type: Left heel with dark red-purple deep tissue pressure injury; .3X1cm, pt states this occurred prior to admission when she was bedridden Pressure Injury POA: Yes Right heel with intact skin.   Right great toe with partial thickness abrasion to anterior area near nailbed, red and dry, .8X.8X.1cm, generalized edema and erythremia.  Pt states she bumped it against an object. Dressing procedure/placement/frequency: Topical treatment orders provided for bedside nurse to perform as follows: Float heels to reduce pressure. Foam dressing to left heel, change Q 3 days or PRN soiling Apply Bactroban to right great toe wound Q day, then cover with foam dressing.  (Change foam dressing Q 3 days or PRN soiling.) Please re-consult if further assistance is needed.  Thank-you,  Julien Girt MSN, [place], [place], [place], [place]

## 2020-08-16 ENCOUNTER — Ambulatory Visit (INDEPENDENT_AMBULATORY_CARE_PROVIDER_SITE_OTHER): Payer: Medicare Other | Admitting: Licensed Clinical Social Worker

## 2020-08-16 ENCOUNTER — Telehealth: Payer: Self-pay | Admitting: Internal Medicine

## 2020-08-16 DIAGNOSIS — F4310 Post-traumatic stress disorder, unspecified: Secondary | ICD-10-CM

## 2020-08-16 DIAGNOSIS — F0630 Mood disorder due to known physiological condition, unspecified: Secondary | ICD-10-CM | POA: Diagnosis not present

## 2020-08-16 DIAGNOSIS — F411 Generalized anxiety disorder: Secondary | ICD-10-CM

## 2020-08-16 DIAGNOSIS — R443 Hallucinations, unspecified: Secondary | ICD-10-CM

## 2020-08-16 DIAGNOSIS — R739 Hyperglycemia, unspecified: Secondary | ICD-10-CM

## 2020-08-16 DIAGNOSIS — E034 Atrophy of thyroid (acquired): Secondary | ICD-10-CM

## 2020-08-16 DIAGNOSIS — F32A Depression, unspecified: Secondary | ICD-10-CM | POA: Diagnosis present

## 2020-08-16 DIAGNOSIS — T380X5A Adverse effect of glucocorticoids and synthetic analogues, initial encounter: Secondary | ICD-10-CM

## 2020-08-16 LAB — HEPATIC FUNCTION PANEL
ALT: 26 U/L (ref 0–44)
AST: 20 U/L (ref 15–41)
Albumin: 3.3 g/dL — ABNORMAL LOW (ref 3.5–5.0)
Alkaline Phosphatase: 77 U/L (ref 38–126)
Bilirubin, Direct: 0.2 mg/dL (ref 0.0–0.2)
Indirect Bilirubin: 0.4 mg/dL (ref 0.3–0.9)
Total Bilirubin: 0.6 mg/dL (ref 0.3–1.2)
Total Protein: 5.9 g/dL — ABNORMAL LOW (ref 6.5–8.1)

## 2020-08-16 LAB — GLUCOSE, CAPILLARY
Glucose-Capillary: 216 mg/dL — ABNORMAL HIGH (ref 70–99)
Glucose-Capillary: 339 mg/dL — ABNORMAL HIGH (ref 70–99)
Glucose-Capillary: 156 mg/dL — ABNORMAL HIGH (ref 70–99)
Glucose-Capillary: 179 mg/dL — ABNORMAL HIGH (ref 70–99)

## 2020-08-16 LAB — PHOSPHORUS: Phosphorus: 5 mg/dL — ABNORMAL HIGH (ref 2.5–4.6)

## 2020-08-16 MED ORDER — HYDROCORTISONE ACETATE 25 MG RE SUPP
25.0000 mg | Freq: Two times a day (BID) | RECTAL | Status: DC
  Administered 2020-08-17 – 2020-08-22 (×10): 25 mg via RECTAL
  Filled 2020-08-16 (×13): qty 1

## 2020-08-16 MED ORDER — PREDNISONE 20 MG PO TABS
40.0000 mg | ORAL_TABLET | Freq: Every day | ORAL | Status: AC
  Administered 2020-08-17 – 2020-08-19 (×3): 40 mg via ORAL
  Filled 2020-08-16 (×3): qty 2

## 2020-08-16 MED ORDER — GUAIFENESIN 100 MG/5ML PO SOLN
600.0000 mg | Freq: Four times a day (QID) | ORAL | Status: DC
  Administered 2020-08-16 – 2020-08-22 (×23): 600 mg via ORAL
  Filled 2020-08-16 (×7): qty 30
  Filled 2020-08-16 (×2): qty 10
  Filled 2020-08-16 (×5): qty 30
  Filled 2020-08-16: qty 20
  Filled 2020-08-16: qty 30
  Filled 2020-08-16 (×2): qty 20
  Filled 2020-08-16: qty 10
  Filled 2020-08-16: qty 30
  Filled 2020-08-16: qty 10
  Filled 2020-08-16: qty 20
  Filled 2020-08-16: qty 10
  Filled 2020-08-16: qty 20
  Filled 2020-08-16 (×2): qty 30

## 2020-08-16 NOTE — TOC Initial Note (Signed)
Transition of Care [facility]) - Initial/Assessment Note    Patient Details  Name: Joan [name] MRN: [id number] Date of Birth: [birth year]  Transition of [facility]) CM/SW Contact:    Dessa Phi, RN Phone Number: 08/16/2020, 5:37 PM  Clinical Narrative: PT recc SNF-patient in agreement. Faxed out await bed offers;await pasrr.Will need auth once choice of SNF made.                  Expected Discharge Plan: Skilled Nursing Facility Barriers to Discharge: Continued Medical Work up   Patient Goals and CMS Choice Patient states their goals for this hospitalization and ongoing recovery are:: go to rehab CMS Medicare.gov Compare Post Acute Care list provided to:: Patient Choice offered to / list presented to : Patient  Expected Discharge Plan and Services Expected Discharge Plan: [place]   Discharge Planning Services: CM Consult Post Acute Care Choice: [place] Living arrangements for the past 2 months: Single Family Home                                      Prior Living Arrangements/Services Living arrangements for the past 2 months: Single Family Home Lives with:: Self Patient language and need for interpreter reviewed:: Yes Do you feel safe going back to the place where you live?: Yes      Need for Family Participation in Patient Care: No (Comment) Care giver support system in place?: Yes (comment) Current home services: DME (rw/home 02) Criminal Activity/Legal Involvement Pertinent to Current Situation/Hospitalization: No - Comment as needed  Activities of Daily Living Home Assistive Devices/Equipment: Eyeglasses, Nebulizer, Oxygen, Walker (specify type), CPAP (humidifier connected to O2) ADL Screening (condition at time of admission) Patient's cognitive ability adequate to safely complete daily activities?: Yes Is the patient deaf or have difficulty hearing?: No Does the patient have difficulty seeing, even when wearing  glasses/contacts?: No Does the patient have difficulty concentrating, remembering, or making decisions?: No Patient able to express need for assistance with ADLs?: Yes Does the patient have difficulty dressing or bathing?: No Independently performs ADLs?: Yes (appropriate for developmental age) Does the patient have difficulty walking or climbing stairs?: Yes (secondary to shortness of breath) Weakness of Legs: None Weakness of Arms/Hands: None  Permission Sought/Granted Permission sought to share information with : Case Manager Permission granted to share information with : Yes, Verbal Permission Granted  Share Information with NAME: Case Manager     Permission granted to share info w Relationship: [name] Martel dtr [phone]     Emotional Assessment Appearance:: Appears stated age Attitude/Demeanor/Rapport: Gracious Affect (typically observed): Accepting Orientation: : Oriented to Self, Oriented to Place, Oriented to  Time, Oriented to Situation Alcohol / Substance Use: Tobacco Use Psych Involvement: Yes (comment)  Admission diagnosis:  COPD exacerbation ([place]) [J44.1] Patient Active Problem List   Diagnosis Date Noted   Depression 08/16/2020   Steroid-induced hyperglycemia 08/16/2020   COPD exacerbation ([place]) 08/07/2020   PTSD (post-traumatic stress disorder) 02/09/2020   Mood disorder in conditions classified elsewhere 02/09/2020   Generalized anxiety disorder 02/09/2020   Type II diabetes mellitus with manifestations ([place]) 10/29/2019   Coarse tremors 10/28/2019   Chronic rhinitis 10/21/2019   Allergic rhinitis 06/16/2019   Cervical spondylosis without myelopathy 12/18/2018   Hypertension    Encounter for general adult medical examination with abnormal findings 11/06/2018   Tobacco abuse 01/15/2018   Hypomagnesemia 12/29/2017  Pulmonary nodule 11/11/2017   Carotid artery disease ([place]) 11/05/2017   OSA on CPAP 08/07/2017   Gastroesophageal reflux disease  06/23/2017   Hypothyroidism due to acquired atrophy of thyroid 04/05/2017   Primary osteoarthritis involving multiple joints 04/05/2017   Chronic constipation 02/10/2017   Iron deficiency anemia due to dietary causes 02/10/2017   Osteoarthritis 02/10/2017   Hyperlipidemia associated with type 2 diabetes mellitus ([place]) 02/10/2017   Chronic respiratory failure with hypoxia ([place]) 12/16/2016   Essential hypertension 11/29/2016   Vocal cord dysfunction    COPD, severe ([place]) 09/24/2016   Anxiety 09/24/2016   PCP:  [name], MD Pharmacy:   [facility]  ([place], [address], [address] [place], [place] [address] Phone: [phone] Fax: [phone]  [id number] [place], [place] [address] [place] [address] Phone: [phone] Fax: [phone]     Social Determinants of Health (SDOH) Interventions    Readmission Risk Interventions Readmission Risk Prevention Plan 04/07/2019  Transportation Screening Complete  PCP or Specialist Appt within 3-5 Days Complete  HRI or [place] Complete  Social Work Consult for [place] Planning/Counseling Complete  Palliative Care Screening Not Applicable  Medication Review Press photographer) Complete  Some recent data might be hidden

## 2020-08-16 NOTE — Care Management (Signed)
Transition of Care (TOC) -30 day Note       Patient Details   Name: Joan [name]  CCE:[id number]  Date of Birth:[birth year]     Transition of Care [facility]) CM/SW Contact   Name:athy [place]   Phone Number:[phone]  Date:08/16/20  Time:3:27p     MUST XA:[id number]     To Whom it May Concern:     Please be advised that the above patient will require a short-term nursing home stay, anticipated 30 days or less rehabilitation and strengthening. The plan is for return home.

## 2020-08-16 NOTE — Progress Notes (Signed)
Inpatient Diabetes Program Recommendations  AACE/ADA: New Consensus Statement on Inpatient Glycemic Control   Target Ranges:  Prepandial:   less than 140 mg/dL      Peak postprandial:   less than 180 mg/dL (1-2 hours)      Critically ill patients:  140 - 180 mg/dL   Results for MIYOSHI, LIGAS (MRN [id number]) as of 08/16/2020 13:20  Ref. Range 08/15/2020 08:16 08/15/2020 11:32 08/15/2020 17:59 08/15/2020 21:23 08/16/2020 07:38 08/16/2020 11:36  Glucose-Capillary Latest Ref Range: 70 - 99 mg/dL 196 (H) 247 (H) 99 209 (H) 216 (H) 339 (H)    Review of Glycemic Control  Current orders for Inpatient glycemic control: Lantus 15 units daily, Novolog 5 units TID with meals, Novolog 0-15 units TID with meals, Novolog 0-5 units QHS; Solumedrol 40 mg Q12H  Inpatient Diabetes Program Recommendations:    Insulin: If steroids are continued, please consider increasing meal coverage to Novolog 8 units TID with meals if patient eats at least 50% of meals.  Thanks, Barnie Alderman, RN, MSN, CDE Diabetes Coordinator Inpatient Diabetes Program [phone] (Team Pager from 8am to 5pm)

## 2020-08-16 NOTE — Telephone Encounter (Signed)
LVM for pt to rtn my call to schedule AWV with [facility]. Please schedule this appt if pt calls the office.  °

## 2020-08-16 NOTE — Progress Notes (Signed)
Virtual Visit via Telephone Note  I connected with Joan [name] on 08/16/20 at  2:00 PM EDT by telephone and verified that I am speaking with the correct person using two identifiers.  Location: Patient: hospital Provider: home office   I discussed the limitations, risks, security and privacy concerns of performing an evaluation and management service by telephone and the availability of in person appointments. I also discussed with the patient that there may be a patient responsible charge related to this service. The patient expressed understanding and agreed to proceed.   I discussed the assessment and treatment plan with the patient. The patient was provided an opportunity to ask questions and all were answered. The patient agreed with the plan and demonstrated an understanding of the instructions.   The patient was advised to call back or seek an in-person evaluation if the symptoms worsen or if the condition fails to improve as anticipated.  I provided 45 minutes of non-face-to-face time during this encounter.  THERAPIST PROGRESS NOTE  Session Time: 2:00 PM to 2:45 PM  Participation Level: Active  Behavioral Response: CasualAlertappropriate serious medical issues causing anxiety  Type of Therapy: Individual Therapy  Treatment Goals addressed:  PTSD, coping, wants to work on where she is and work through her childhood Interventions: Solution Focused, Strength-based, Supportive, and Other: coping  Summary: Joan [name] is a 60 y.o. female who presents with has been in the hospital since last Monday got sick forgetting medication memory bad, talking pills at the wrong time. Got aggravated and brother would help straighten it out. Couldn't breath couldn't walk to bathroom, had sores on ankles. Brother asked her what is going on not eating and not geting out of bed, felt she was dying and he said ready to go (meaning the hospital). Was going downhill so fast. No energy  not showered or brushed teeth in five days couldn't do it. This month has been a terrible month. Missed appointments not returned calls memory loss didn't know anything at the end. Would tell herself to do this but couldn't. Haven't had cigarette since Sunday. Going through a lot of changes. A lot medications changes. Notice a big difference take Xanax feel like a new person writing things down supposed to, focusing hard time but not like before. Going better than before. Wants to talk to a nutritionist old diet changed, confused and need to get a grip on it, starting to learn it which is good. Oxygen has been staying 97-100 as long as on oxygen. But when goes to the bathroom can't breath. Want to talk to heart doctor. Doctor knew she was here and wants her to be seen. Test saying nothing wrong and patient saying there is something wrong. When walk haven't been walking shake so bad "I am a mess and went downhill". Can't breath when roll over in bed, when walk take a couple of steps and have to collect herself takes forever to get even two doors down. Told hCOPD emphysema exasperation started treatment with getting her breathing related, then address high blood pressure sugars off the wall, full blown diabetic, first was steroid induced diabetic. Going home with insulin Can't get the numbers lowered. Blood pressure 185/165 once in awhile a good rate not often. Ended up ICU. She is step down total care have to go to rehab build up strength and stamina. [facility]. Can go home and get good care at home. Can't do a lot of activities of daily  living. Apply for medical needy Medicaid. Has hospital bills. [name]. Doctors trying to figure out what is going on and patient doesn't want to go until figure it out, doctors too want to figure it out. Glad came to the hospital. Missed appointments like neck, eye appointment, psychiatrist due to being sick and in the hospital. Sister stays the day with her as a  positive..    Therapist reviewed symptoms facilitated expression of thoughts and feelings significant intervention noting significant symptoms patient has had for past month to the point where she was in ICU her blood pressure still high as well as learning she is a diabetic and sugars are way too high still.  Patient engaging in problem solving that will help her with her situation, also noted patient is glad to be in the hospital therapist agreeing as patient significantly got sick and went downhill.  Updated patient on how she was feeling.  Provide supportive interventions.  Therapist provided active listening open questions, strength-based interventions Suicidal/Homicidal: No  Plan: Return again in 3 weeks.2.  Work with patient on emotional regulation, stress management, coping  Diagnosis: Axis I: PTSD, hallucinations, GAD, Mood Disorder in conditions classified elsewhere    Axis II: No diagnosis    [name], LCSW 08/16/2020

## 2020-08-16 NOTE — Progress Notes (Signed)
PROGRESS NOTE  Joan [name] IFO:[id number] DOB: 10/28/1960 DOA: 08/07/2020 PCP: [name], MD  Brief History   60 year old woman PMH COPD, chronic hypoxic respiratory failure on 3 L presented with shortness of breath.  Admitted for COPD exacerbation.  A & P  COPD exacerbation, acute on chronic hypoxic respiratory failure --Continue steroid, bronchodilator.  Slowly improving.  Finished antibiotics.  Echocardiogram was unremarkable and D-dimer was negative. --Continue supplemental oxygen --Needs to stop smoking  Depression --Continue Wellbutrin and Effexor, Seroquel  Hypothyroidism --Continue Synthroid  Steroid-induced hyperglycemia, new diagnosis diabetes mellitus type 2 --Continue insulin, taper steroids  Obstructive sleep apnea --Continue CPAP  Essential hypertension --Continue bisoprolol, diuretic, hydralazine  Skin Assessment: I agree with the wound assessment as performed by the wound care RN as outlined below: Pressure Injury 08/15/20 Deep Tissue Pressure Injury - Purple or maroon localized area of discolored intact skin or blood-filled blister due to damage of underlying soft tissue from pressure and/or shear. (Active)  08/15/20   Location:   Location Orientation:   Staging: Deep Tissue Pressure Injury - Purple or maroon localized area of discolored intact skin or blood-filled blister due to damage of underlying soft tissue from pressure and/or shear.  Wound Description (Comments):   Present on Admission: Yes   Disposition Plan:  Discussion:   Status is: Inpatient  Remains inpatient appropriate because:Inpatient level of care appropriate due to severity of illness  Dispo: The patient is from: Home              Anticipated d/c is to: SNF              Patient currently is not medically stable to d/c.   Difficult to place patient No  DVT prophylaxis: Place TED hose Start: 08/10/20 1232 enoxaparin (LOVENOX) injection 40 mg Start: 08/08/20 1000    Code Status: Full Code Level of care: Progressive Family Communication: sister at bedside  [name], MD  [facility] Direct contact: see [email or web address] (further directions at bottom of note if needed) 7PM-7AM contact night coverage as at bottom of note 08/16/2020, 5:24 PM  LOS: 9 days    Interval History/Subjective  CC: f/u SOB Feels ok at moment but gets SOB w/ movement.  Review of Systems  Gastrointestinal:  Negative for nausea and vomiting.   Objective   Vitals:  Vitals:   08/16/20 1315 08/16/20 1439  BP:  138/83  Pulse:  79  Resp:    Temp:    SpO2: 93%     Exam: Physical Exam Constitutional:      Appearance: Normal appearance.  Cardiovascular:     Rate and Rhythm: Normal rate and regular rhythm.     Heart sounds: No murmur heard.   No gallop.  Pulmonary:     Effort: No respiratory distress.     Breath sounds: Wheezing present. No rales.     Comments: Mild increased resp effort but speaks in full paragraphs at length Neurological:     Mental Status: She is alert.  Psychiatric:        Mood and Affect: Mood normal.        Behavior: Behavior normal.    I have personally reviewed the labs and other data, making special note of:   Today's Data  CBG stable Hepatic function panel stable  Scheduled Meds:  acyclovir  400 mg Oral BID   atorvastatin  20 mg Oral Daily   bisoprolol  5 mg Oral Daily   budesonide (PULMICORT) nebulizer solution  0.5  mg Nebulization BID   buPROPion  150 mg Oral Daily   chlorhexidine  15 mL Mouth Rinse BID   Chlorhexidine Gluconate Cloth  6 each Topical Daily   enoxaparin (LOVENOX) injection  40 mg Subcutaneous Q24H   fluticasone  1 spray Each Nare Daily   furosemide  20 mg Oral Daily   guaiFENesin  600 mg Oral QID   hydrALAZINE  50 mg Oral Q8H   hydrocortisone  25 mg Rectal BID   insulin aspart  0-15 Units Subcutaneous TID WC   insulin aspart  0-5 Units Subcutaneous QHS   insulin aspart  5 Units Subcutaneous TID WC    insulin glargine  15 Units Subcutaneous Daily   ipratropium-albuterol  3 mL Nebulization TID   levothyroxine  25 mcg Oral Q0600   loratadine  10 mg Oral Daily   mouth rinse  15 mL Mouth Rinse q12n4p   metroNIDAZOLE  500 mg Oral Q12H   montelukast  10 mg Oral QHS   mupirocin cream   Topical Daily   nicotine  14 mg Transdermal Daily   olopatadine  1 drop Both Eyes BID   pantoprazole  40 mg Oral BID   [START ON 08/17/2020] predniSONE  40 mg Oral Q breakfast   pregabalin  150 mg Oral QHS   QUEtiapine  50 mg Oral QHS   saccharomyces boulardii  250 mg Oral BID   venlafaxine XR  150 mg Oral Q breakfast   Continuous Infusions:  sodium chloride 10 mL/hr at 08/12/20 1739    Principal Problem:   COPD exacerbation (HCC) Active Problems:   Hypothyroidism due to acquired atrophy of thyroid   Depression   Steroid-induced hyperglycemia   LOS: 9 days   How to contact the [facility] Attending or Consulting provider 7A - 7P or covering provider during after hours [place], for this patient?  Check the care team in [facility] and look for a) attending/consulting TRH provider listed and b) the [facility] team listed Log into [email or web address] and use [place]'s universal password to access. If you do not have the password, please contact the hospital operator. Locate the [facility] provider you are looking for under [facility] and page to a number that you can be directly reached. If you still have difficulty reaching the provider, please page the [facility] (Director on Call) for the Hospitalists listed on amion for assistance.

## 2020-08-16 NOTE — Hospital Course (Addendum)
.  60 year old woman PMH COPD, chronic hypoxic respiratory failure on 3 L presented with shortness of breath.  Admitted for COPD exacerbation.  Discharged to SNF 6/24, peer-to-peer conducted and denied.  Patient appealed to insurance company 6/24, was accepted 6/28.

## 2020-08-16 NOTE — Progress Notes (Signed)
Nutrition Brief Note  Consult received for nutrition requirements and status.   Wt Readings from Last 15 Encounters:  08/15/20 110.3 kg  06/30/20 99.2 kg  05/04/20 98.3 kg  05/01/20 97.3 kg  03/14/20 96.3 kg  03/07/20 97.1 kg  12/10/19 95 kg  10/21/19 94.1 kg  07/09/19 90.7 kg  06/15/19 91.4 kg  05/24/19 91.2 kg  05/11/19 90.7 kg  04/29/19 87.5 kg  04/13/19 99.3 kg  03/17/19 90.7 kg    Body mass index is 36.97 kg/m. Patient meets criteria for obesity based on current BMI.   Current diet order is Heart Healthy. She has been eating 100% of large meals over the past 5 days.   6/18- 100% of dinner (821 kcal, 23 grams protein) 6/19- 100% of all meals (total of 2183 kcal, 64 grams protein) 6/20- 95% of breakfast (705 kcal, 38 grams protein) 6/21- 100% of breakfast and lunch (total of 1510 kcal and 65 grams protein) 6/22- 100% of breakfast (947 kcal and 28 grams protein)   Labs and medications reviewed. She has medical hx of DM, is receiving insulin during this admission, and is being followed by DM Coordinator. HgbA1c on 08/10/20 was 6.8%.   Will change diet order from Heart Healthy to Heart Healthy/Carb Modified.   No additional nutrition interventions warranted at this time. If nutrition issues arise, please consult re-RD.       Jarome Matin, MS, RD, LDN, CNSC Inpatient Clinical Dietitian RD pager # available in [place]  After hours/weekend pager # available in [facility]

## 2020-08-16 NOTE — NC FL2 (Signed)
[place] LEVEL OF CARE SCREENING TOOL     IDENTIFICATION  Patient Name: Joan [name] Birthdate: [birth year] Sex: female Admission Date (Current Location): 08/07/2020  [facility] and [place] Number:  Herbalist and Address:  [facility],  [address] [place], [place]      Provider Number: [id number]  Attending Physician Name and Address:  Samuella Cota, MD  Relative Name and Phone Number:  Doristine Counter [phone]    Current Level of Care: Hospital Recommended Level of Care: [place] Prior Approval Number:    Date Approved/Denied:   PASRR Number:    Discharge Plan: SNF    Current Diagnoses: Patient Active Problem List   Diagnosis Date Noted   COPD exacerbation ([place]) 08/07/2020   PTSD (post-traumatic stress disorder) 02/09/2020   Mood disorder in conditions classified elsewhere 02/09/2020   Generalized anxiety disorder 02/09/2020   Type II diabetes mellitus with manifestations ([place]) 10/29/2019   Coarse tremors 10/28/2019   Chronic rhinitis 10/21/2019   Allergic rhinitis 06/16/2019   Cervical spondylosis without myelopathy 12/18/2018   Hypertension    Encounter for general adult medical examination with abnormal findings 11/06/2018   Tobacco abuse 01/15/2018   Hypomagnesemia 12/29/2017   Pulmonary nodule 11/11/2017   Carotid artery disease ([place]) 11/05/2017   OSA on CPAP 08/07/2017   Gastroesophageal reflux disease 06/23/2017   Hypothyroidism due to acquired atrophy of thyroid 04/05/2017   Primary osteoarthritis involving multiple joints 04/05/2017   Chronic constipation 02/10/2017   Iron deficiency anemia due to dietary causes 02/10/2017   Osteoarthritis 02/10/2017   Hyperlipidemia associated with type 2 diabetes mellitus ([place]) 02/10/2017   Chronic respiratory failure with hypoxia ([place]) 12/16/2016   Essential hypertension 11/29/2016   Vocal cord dysfunction    COPD, severe ([place])  09/24/2016   Anxiety 09/24/2016    Orientation RESPIRATION BLADDER Height & Weight     Self, Time, Situation  O2 Continent Weight: 110.3 kg Height:  5\' 8"  (172.7 cm)  BEHAVIORAL SYMPTOMS/MOOD NEUROLOGICAL BOWEL NUTRITION STATUS      Continent Diet (Heart Healthy)  AMBULATORY STATUS COMMUNICATION OF NEEDS Skin   Limited Assist Verbally Normal                       Personal Care Assistance Level of Assistance  Bathing, Feeding, Dressing Bathing Assistance: Limited assistance Feeding assistance: Limited assistance Dressing Assistance: Limited assistance     Functional Limitations Info  Sight, Hearing, Speech Sight Info: Impaired (eyeglasses) Hearing Info: Adequate Speech Info: Impaired (Dentures-uppers/lowers)    SPECIAL CARE FACTORS FREQUENCY  PT (By licensed PT), OT (By licensed OT)     PT Frequency:  (5x week) OT Frequency:  (5x week)            Contractures Contractures Info: Not present    Additional Factors Info  Code Status, Allergies, Psychotropic, Insulin Sliding Scale Code Status Info:  (Full) Allergies Info:  (Keflex (Cephalexin), Prednisone, Shellfish Allergy, Tetracyclines & Related, Dilaudid (Hydromorphone Hcl), Incruse Ellipta (Umeclidinium Bromide), Other, Tuberculin Tests) Psychotropic Info:  (Effexor;xanax;wellbutrin,trazadone) Insulin Sliding Scale Info:  (SSI)       Current Medications (08/16/2020):  This is the current hospital active medication list Current Facility-Administered Medications  Medication Dose Route Frequency Provider Last Rate Last Admin   0.9 %  sodium chloride infusion   Intravenous PRN Regalado, Belkys A, MD 10 mL/hr at 08/12/20 1739 New Bag at 08/12/20 1739   acetaminophen (TYLENOL) tablet  650 mg  650 mg Oral Q6H PRN Regalado, Belkys A, MD   650 mg at 08/13/20 1826   Or   acetaminophen (TYLENOL) suppository 650 mg  650 mg Rectal Q6H PRN Regalado, Belkys A, MD       acyclovir (ZOVIRAX) tablet 400 mg  400 mg Oral BID  Regalado, Belkys A, MD   400 mg at 08/16/20 0841   albuterol (PROVENTIL) (2.5 MG/3ML) 0.083% nebulizer solution 2.5 mg  2.5 mg Nebulization Q2H PRN Regalado, Belkys A, MD       ALPRAZolam (XANAX) tablet 0.5 mg  0.5 mg Oral TID PRN Regalado, Belkys A, MD   0.5 mg at 08/16/20 1435   alum & mag hydroxide-simeth (MAALOX/MYLANTA) 200-200-20 MG/5ML suspension 30 mL  30 mL Oral Q6H PRN Shelly Coss, MD       antiseptic oral rinse (BIOTENE) solution 15 mL  15 mL Mouth Rinse PRN Regalado, Belkys A, MD       atorvastatin (LIPITOR) tablet 20 mg  20 mg Oral Daily Regalado, Belkys A, MD   20 mg at 08/16/20 0842   bisoprolol (ZEBETA) tablet 5 mg  5 mg Oral Daily Regalado, Belkys A, MD   5 mg at 08/16/20 0842   budesonide (PULMICORT) nebulizer solution 0.5 mg  0.5 mg Nebulization BID Regalado, Belkys A, MD   0.5 mg at 08/16/20 6834   buPROPion (WELLBUTRIN XL) 24 hr tablet 150 mg  150 mg Oral Daily Regalado, Belkys A, MD   150 mg at 08/16/20 0842   Camphor-Menthol-Methyl Sal 3.03-02-08 % PTCH 1 patch  1 patch Topical Daily PRN Regalado, Belkys A, MD       chlorhexidine (PERIDEX) 0.12 % solution 15 mL  15 mL Mouth Rinse BID Regalado, Belkys A, MD   15 mL at 08/16/20 0846   Chlorhexidine Gluconate Cloth 2 % PADS 6 each  6 each Topical Daily Regalado, Belkys A, MD   6 each at 08/15/20 0839   enoxaparin (LOVENOX) injection 40 mg  40 mg Subcutaneous Q24H Regalado, Belkys A, MD   40 mg at 08/16/20 0845   fluticasone (FLONASE) 50 MCG/ACT nasal spray 1 spray  1 spray Each Nare Daily Regalado, Belkys A, MD   1 spray at 08/16/20 0845   furosemide (LASIX) tablet 20 mg  20 mg Oral Daily Regalado, Belkys A, MD   20 mg at 08/16/20 0845   guaiFENesin (ROBITUSSIN) 100 MG/5ML solution 600 mg  600 mg Oral QID Samuella Cota, MD   600 mg at 08/16/20 1321   hydrALAZINE (APRESOLINE) injection 5 mg  5 mg Intravenous Q6H PRN Regalado, Belkys A, MD   5 mg at 08/12/20 1550   hydrALAZINE (APRESOLINE) tablet 50 mg  50 mg Oral Q8H  Regalado, Belkys A, MD   50 mg at 08/16/20 1321   hydrocortisone (ANUSOL-HC) suppository 25 mg  25 mg Rectal BID PRN Regalado, Belkys A, MD       insulin aspart (novoLOG) injection 0-15 Units  0-15 Units Subcutaneous TID WC Regalado, Belkys A, MD   11 Units at 08/16/20 1320   insulin aspart (novoLOG) injection 0-5 Units  0-5 Units Subcutaneous QHS Regalado, Belkys A, MD   2 Units at 08/15/20 2151   insulin aspart (novoLOG) injection 5 Units  5 Units Subcutaneous TID WC Regalado, Belkys A, MD   5 Units at 08/16/20 1321   insulin glargine (LANTUS) injection 15 Units  15 Units Subcutaneous Daily Regalado, Belkys A, MD   15 Units at 08/16/20 [phone]  ipratropium-albuterol (DUONEB) 0.5-2.5 (3) MG/3ML nebulizer solution 3 mL  3 mL Nebulization TID Regalado, Belkys A, MD   3 mL at 08/16/20 1314   levothyroxine (SYNTHROID) tablet 25 mcg  25 mcg Oral Q0600 Regalado, Belkys A, MD   25 mcg at 08/16/20 0557   loratadine (CLARITIN) tablet 10 mg  10 mg Oral Daily Regalado, Belkys A, MD   10 mg at 08/16/20 0841   magnesium hydroxide (MILK OF MAGNESIA) suspension 30 mL  30 mL Oral Daily PRN Regalado, Belkys A, MD       MEDLINE mouth rinse  15 mL Mouth Rinse q12n4p Regalado, Belkys A, MD   15 mL at 08/15/20 1647   methylPREDNISolone sodium succinate (SOLU-MEDROL) 40 mg/mL injection 40 mg  40 mg Intravenous Q12H Harris, Loree Fee D, NP   40 mg at 08/16/20 0557   metroNIDAZOLE (FLAGYL) tablet 500 mg  500 mg Oral Q12H Regalado, Belkys A, MD   500 mg at 08/16/20 0844   montelukast (SINGULAIR) tablet 10 mg  10 mg Oral QHS Regalado, Belkys A, MD   10 mg at 08/15/20 2153   mupirocin cream (BACTROBAN) 2 %   Topical Daily Regalado, Belkys A, MD   Given at 08/16/20 1436   nicotine (NICODERM CQ - dosed in mg/24 hours) patch 14 mg  14 mg Transdermal Daily Regalado, Belkys A, MD       olopatadine (PATANOL) 0.1 % ophthalmic solution 1 drop  1 drop Both Eyes BID Regalado, Belkys A, MD   1 drop at 08/16/20 0846   ondansetron (ZOFRAN)  tablet 4 mg  4 mg Oral Q6H PRN Regalado, Belkys A, MD       Or   ondansetron (ZOFRAN) injection 4 mg  4 mg Intravenous Q6H PRN Regalado, Belkys A, MD       pantoprazole (PROTONIX) EC tablet 40 mg  40 mg Oral BID Regalado, Belkys A, MD   40 mg at 08/16/20 0841   polyethylene glycol (MIRALAX / GLYCOLAX) packet 17 g  17 g Oral Daily PRN Regalado, Belkys A, MD       pregabalin (LYRICA) capsule 150 mg  150 mg Oral QHS Regalado, Belkys A, MD   150 mg at 08/15/20 2153   QUEtiapine (SEROQUEL) tablet 50 mg  50 mg Oral QHS Regalado, Belkys A, MD   50 mg at 08/15/20 2156   saccharomyces boulardii (FLORASTOR) capsule 250 mg  250 mg Oral BID Regalado, Belkys A, MD   250 mg at 08/16/20 0842   traZODone (DESYREL) tablet 100 mg  100 mg Oral QHS PRN Regalado, Belkys A, MD   100 mg at 08/15/20 2155   venlafaxine XR (EFFEXOR-XR) 24 hr capsule 150 mg  150 mg Oral Q breakfast Regalado, Belkys A, MD   150 mg at 08/16/20 0844   witch hazel-glycerin (TUCKS) pad   Topical PRN Elmarie Shiley, MD   Given at 08/12/20 2151     Discharge Medications: Please see discharge summary for a list of discharge medications.  Relevant Imaging Results:  Relevant Lab Results:   Additional Information SS#[id number], Juliann Pulse, [place]

## 2020-08-16 NOTE — Progress Notes (Signed)
Occupational Therapy Treatment Patient Details Name: Joan [name] MRN: [id number] DOB: October 10, 1960 Today's Date: 08/16/2020    History of present illness Patient is 60 y.o. female admitted on 08/07/2020 with COPD exacerbation with PMH significant for HTN, hypothyroidism, GERD, COPD, DM, chronic pain, anxiety, depression, PTSD, bipolar d/o, smoker, Lt THR on 01/26/19, s/p laproscopic appendectomy on 03/31/19 and OA.   OT comments  Upon arrival patient groggy, improved with mobility. Appears to have short term memory deficits asking multiple times who therapist is/discipline explanation as well as why she can't walk in her room by herself with walker. Patient overall min G assist for functional transfers and ambulation using rolling walker needing significantly increased time and mod cues to minimize conversation and focus on breathing. Patient set up level for perianal care seated on commode. Will continue to follow.   Follow Up Recommendations  SNF    Equipment Recommendations  None recommended by OT       Precautions / Restrictions Precautions Precautions: Fall Precaution Comments: 3L O2 baseline       Mobility Bed Mobility Overal bed mobility: Modified Independent                  Transfers Overall transfer level: Needs assistance Equipment used: Rolling walker (2 wheeled) Transfers: Sit to/from Stand Sit to Stand: Min guard              Balance Overall balance assessment: Needs assistance;History of Falls Sitting-balance support: Feet supported Sitting balance-Leahy Scale: Good     Standing balance support: No upper extremity supported Standing balance-Leahy Scale: Fair Standing balance comment: static standing without UE support                           ADL either performed or assessed with clinical judgement   ADL Overall ADL's : Needs assistance/impaired     Grooming: Wash/dry hands;Set up;Sitting                 Lower Body  Dressing Details (indicate cue type and reason): patient slide on flip flops vs donning socks Toilet Transfer: Min Statistician Details (indicate cue type and reason): min G for safety Toileting- Clothing Manipulation and Hygiene: Set up;Sitting/lateral lean Toileting - Clothing Manipulation Details (indicate cue type and reason): for perianal care after bowel movement     Functional mobility during ADLs: Min guard;Rolling walker General ADL Comments: patient needs significant time to ambulate ~52ft in room from commode to recliner "its too much if I go fast" needing mod cues to minimize conversation and focus on breathing.      Cognition Arousal/Alertness: Awake/alert Behavior During Therapy: WFL for tasks assessed/performed Overall Cognitive Status: No family/caregiver present to determine baseline cognitive functioning                                 General Comments: limited short term memory asking multiple times if I am OT. also asking multiple times when she can ambulate in room by herself after explained safety precautions of having someone with her due to limited activity tolerance and O2 line management. limited carry over of cues to minimize talking with activity to focus on breathing. very conversive                   Pertinent Vitals/ Pain       Pain Assessment: Faces Faces Pain Scale: Hurts  little more Pain Location: hemorrhoids Pain Descriptors / Indicators: Sore Pain Intervention(s): Monitored during session         Frequency  Min 2X/week        Progress Toward Goals  OT Goals(current goals can now be found in the care plan section)  Progress towards OT goals: Progressing toward goals  Acute Rehab OT Goals Patient Stated Goal: More help at home. OT Goal Formulation: With patient Time For Goal Achievement: 08/26/20 Potential to Achieve Goals: Fair ADL Goals Pt Will Perform Upper Body Bathing: sitting;with  modified independence;with adaptive equipment Pt Will Perform Lower Body Bathing: with modified independence;with adaptive equipment;sitting/lateral leans Pt Will Perform Lower Body Dressing: with set-up;with adaptive equipment;sitting/lateral leans;sit to/from stand Pt Will Transfer to Toilet: with modified independence;bedside commode;stand pivot transfer Pt Will Perform Toileting - Clothing Manipulation and hygiene: with modified independence;with adaptive equipment;sitting/lateral leans Additional ADL Goal #1: Pt will engage in 8+ min combined sitting and standing functional activities without loss of balance, in order to demonstrate improved activity tolerance and balance needed to perform ADLs safely at home. Additional ADL Goal #2: Patient will identify at least 3 energy conservation strategies to employ at home in order to maximize function and quality of life and decrease caregiver burden while preventing exacerbation of symptoms and rehospitalization.  Plan Discharge plan remains appropriate       AM-PAC OT "6 Clicks" Daily Activity     Outcome Measure   Help from another person eating meals?: None Help from another person taking care of personal grooming?: A Little Help from another person toileting, which includes using toliet, bedpan, or urinal?: A Little Help from another person bathing (including washing, rinsing, drying)?: A Lot Help from another person to put on and taking off regular upper body clothing?: A Little Help from another person to put on and taking off regular lower body clothing?: A Lot 6 Click Score: 17    End of Session Equipment Utilized During Treatment: Rolling walker;Oxygen  OT Visit Diagnosis: Unsteadiness on feet (R26.81);Pain;Other (comment) (Z73.6 decreased ADLs) Pain - part of body:  (buttock)   Activity Tolerance Patient limited by fatigue   Patient Left in chair;with call bell/phone within reach;Other (comment) (respiratory therapist present)    Nurse Communication Mobility status        Time: [phone] OT Time Calculation (min): 30 min  Charges: OT General Charges $OT Visit: 1 Visit OT Treatments $Self Care/Home Management : 23-37 mins  Delbert Phenix OT OT pager: [phone]   Rosemary Holms 08/16/2020, 8:58 AM

## 2020-08-16 NOTE — NC FL2 (Signed)
[place] LEVEL OF CARE SCREENING TOOL     IDENTIFICATION  Patient Name: Joan [name] Birthdate: [birth year] Sex: female Admission Date (Current Location): 08/07/2020  [facility] and [place] Number:  Herbalist and Address:  [facility],  [place], [place]      Provider Number: [id number]  Attending Physician Name and Address:  Samuella Cota, MD  Relative Name and Phone Number:  Doristine Counter [phone] [phone]    Current Level of Care: Hospital Recommended Level of Care: [place] Prior Approval Number:    Date Approved/Denied:   PASRR Number:    Discharge Plan: SNF    Current Diagnoses: Patient Active Problem List   Diagnosis Date Noted   COPD exacerbation ([place]) 08/07/2020   PTSD (post-traumatic stress disorder) 02/09/2020   Mood disorder in conditions classified elsewhere 02/09/2020   Generalized anxiety disorder 02/09/2020   Type II diabetes mellitus with manifestations ([place]) 10/29/2019   Coarse tremors 10/28/2019   Chronic rhinitis 10/21/2019   Allergic rhinitis 06/16/2019   Cervical spondylosis without myelopathy 12/18/2018   Hypertension    Encounter for general adult medical examination with abnormal findings 11/06/2018   Tobacco abuse 01/15/2018   Hypomagnesemia 12/29/2017   Pulmonary nodule 11/11/2017   Carotid artery disease ([place]) 11/05/2017   OSA on CPAP 08/07/2017   Gastroesophageal reflux disease 06/23/2017   Hypothyroidism due to acquired atrophy of thyroid 04/05/2017   Primary osteoarthritis involving multiple joints 04/05/2017   Chronic constipation 02/10/2017   Iron deficiency anemia due to dietary causes 02/10/2017   Osteoarthritis 02/10/2017   Hyperlipidemia associated with type 2 diabetes mellitus ([place]) 02/10/2017   Chronic respiratory failure with hypoxia ([place]) 12/16/2016   Essential hypertension 11/29/2016   Vocal cord dysfunction    COPD, severe ([place])  09/24/2016   Anxiety 09/24/2016    Orientation RESPIRATION BLADDER Height & Weight     Self, Time, Situation  O2 Continent Weight: 110.3 kg Height:  5\' 8"  (172.7 cm)  BEHAVIORAL SYMPTOMS/MOOD NEUROLOGICAL BOWEL NUTRITION STATUS      Continent Diet (Heart Healthy)  AMBULATORY STATUS COMMUNICATION OF NEEDS Skin   Limited Assist Verbally Normal                       Personal Care Assistance Level of Assistance  Bathing, Feeding, Dressing Bathing Assistance: Limited assistance Feeding assistance: Limited assistance Dressing Assistance: Limited assistance     Functional Limitations Info  Sight, Hearing, Speech Sight Info: Impaired (eyeglasses) Hearing Info: Adequate Speech Info: Impaired (Dentures-uppers/lowers)    SPECIAL CARE FACTORS FREQUENCY  PT (By licensed PT), OT (By licensed OT)     PT Frequency:  (5x week) OT Frequency:  (5x week)            Contractures Contractures Info: Not present    Additional Factors Info  Code Status, Allergies, Psychotropic, Insulin Sliding Scale Code Status Info:  (Full) Allergies Info:  (Keflex (Cephalexin), Prednisone, Shellfish Allergy, Tetracyclines & Related, Dilaudid (Hydromorphone Hcl), Incruse Ellipta (Umeclidinium Bromide), Other, Tuberculin Tests) Psychotropic Info:  (Effexor;xanax;wellbutrin,trazadone) Insulin Sliding Scale Info:  (SSI)       Current Medications (08/16/2020):  This is the current hospital active medication list Current Facility-Administered Medications  Medication Dose Route Frequency Provider Last Rate Last Admin   0.9 %  sodium chloride infusion   Intravenous PRN Regalado, Belkys A, MD 10 mL/hr at 08/12/20 1739 New Bag at 08/12/20 1739   acetaminophen (TYLENOL) tablet  650 mg  650 mg Oral Q6H PRN Regalado, Belkys A, MD   650 mg at 08/13/20 1826   Or   acetaminophen (TYLENOL) suppository 650 mg  650 mg Rectal Q6H PRN Regalado, Belkys A, MD       acyclovir (ZOVIRAX) tablet 400 mg  400 mg Oral BID  Regalado, Belkys A, MD   400 mg at 08/16/20 0841   albuterol (PROVENTIL) (2.5 MG/3ML) 0.083% nebulizer solution 2.5 mg  2.5 mg Nebulization Q2H PRN Regalado, Belkys A, MD       ALPRAZolam (XANAX) tablet 0.5 mg  0.5 mg Oral TID PRN Regalado, Belkys A, MD   0.5 mg at 08/16/20 1435   alum & mag hydroxide-simeth (MAALOX/MYLANTA) 200-200-20 MG/5ML suspension 30 mL  30 mL Oral Q6H PRN Shelly Coss, MD       antiseptic oral rinse (BIOTENE) solution 15 mL  15 mL Mouth Rinse PRN Regalado, Belkys A, MD       atorvastatin (LIPITOR) tablet 20 mg  20 mg Oral Daily Regalado, Belkys A, MD   20 mg at 08/16/20 0842   bisoprolol (ZEBETA) tablet 5 mg  5 mg Oral Daily Regalado, Belkys A, MD   5 mg at 08/16/20 0842   budesonide (PULMICORT) nebulizer solution 0.5 mg  0.5 mg Nebulization BID Regalado, Belkys A, MD   0.5 mg at 08/16/20 6712   buPROPion (WELLBUTRIN XL) 24 hr tablet 150 mg  150 mg Oral Daily Regalado, Belkys A, MD   150 mg at 08/16/20 0842   Camphor-Menthol-Methyl Sal 3.03-02-08 % PTCH 1 patch  1 patch Topical Daily PRN Regalado, Belkys A, MD       chlorhexidine (PERIDEX) 0.12 % solution 15 mL  15 mL Mouth Rinse BID Regalado, Belkys A, MD   15 mL at 08/16/20 0846   Chlorhexidine Gluconate Cloth 2 % PADS 6 each  6 each Topical Daily Regalado, Belkys A, MD   6 each at 08/15/20 0839   enoxaparin (LOVENOX) injection 40 mg  40 mg Subcutaneous Q24H Regalado, Belkys A, MD   40 mg at 08/16/20 0845   fluticasone (FLONASE) 50 MCG/ACT nasal spray 1 spray  1 spray Each Nare Daily Regalado, Belkys A, MD   1 spray at 08/16/20 0845   furosemide (LASIX) tablet 20 mg  20 mg Oral Daily Regalado, Belkys A, MD   20 mg at 08/16/20 0845   guaiFENesin (ROBITUSSIN) 100 MG/5ML solution 600 mg  600 mg Oral QID Samuella Cota, MD   600 mg at 08/16/20 1321   hydrALAZINE (APRESOLINE) injection 5 mg  5 mg Intravenous Q6H PRN Regalado, Belkys A, MD   5 mg at 08/12/20 1550   hydrALAZINE (APRESOLINE) tablet 50 mg  50 mg Oral Q8H  Regalado, Belkys A, MD   50 mg at 08/16/20 1321   hydrocortisone (ANUSOL-HC) suppository 25 mg  25 mg Rectal BID PRN Regalado, Belkys A, MD       insulin aspart (novoLOG) injection 0-15 Units  0-15 Units Subcutaneous TID WC Regalado, Belkys A, MD   11 Units at 08/16/20 1320   insulin aspart (novoLOG) injection 0-5 Units  0-5 Units Subcutaneous QHS Regalado, Belkys A, MD   2 Units at 08/15/20 2151   insulin aspart (novoLOG) injection 5 Units  5 Units Subcutaneous TID WC Regalado, Belkys A, MD   5 Units at 08/16/20 1321   insulin glargine (LANTUS) injection 15 Units  15 Units Subcutaneous Daily Regalado, Belkys A, MD   15 Units at 08/16/20 [phone]  ipratropium-albuterol (DUONEB) 0.5-2.5 (3) MG/3ML nebulizer solution 3 mL  3 mL Nebulization TID Regalado, Belkys A, MD   3 mL at 08/16/20 1314   levothyroxine (SYNTHROID) tablet 25 mcg  25 mcg Oral Q0600 Regalado, Belkys A, MD   25 mcg at 08/16/20 0557   loratadine (CLARITIN) tablet 10 mg  10 mg Oral Daily Regalado, Belkys A, MD   10 mg at 08/16/20 0841   magnesium hydroxide (MILK OF MAGNESIA) suspension 30 mL  30 mL Oral Daily PRN Regalado, Belkys A, MD       MEDLINE mouth rinse  15 mL Mouth Rinse q12n4p Regalado, Belkys A, MD   15 mL at 08/15/20 1647   methylPREDNISolone sodium succinate (SOLU-MEDROL) 40 mg/mL injection 40 mg  40 mg Intravenous Q12H Harris, Loree Fee D, NP   40 mg at 08/16/20 0557   metroNIDAZOLE (FLAGYL) tablet 500 mg  500 mg Oral Q12H Regalado, Belkys A, MD   500 mg at 08/16/20 0844   montelukast (SINGULAIR) tablet 10 mg  10 mg Oral QHS Regalado, Belkys A, MD   10 mg at 08/15/20 2153   mupirocin cream (BACTROBAN) 2 %   Topical Daily Regalado, Belkys A, MD   Given at 08/16/20 1436   nicotine (NICODERM CQ - dosed in mg/24 hours) patch 14 mg  14 mg Transdermal Daily Regalado, Belkys A, MD       olopatadine (PATANOL) 0.1 % ophthalmic solution 1 drop  1 drop Both Eyes BID Regalado, Belkys A, MD   1 drop at 08/16/20 0846   ondansetron (ZOFRAN)  tablet 4 mg  4 mg Oral Q6H PRN Regalado, Belkys A, MD       Or   ondansetron (ZOFRAN) injection 4 mg  4 mg Intravenous Q6H PRN Regalado, Belkys A, MD       pantoprazole (PROTONIX) EC tablet 40 mg  40 mg Oral BID Regalado, Belkys A, MD   40 mg at 08/16/20 0841   polyethylene glycol (MIRALAX / GLYCOLAX) packet 17 g  17 g Oral Daily PRN Regalado, Belkys A, MD       pregabalin (LYRICA) capsule 150 mg  150 mg Oral QHS Regalado, Belkys A, MD   150 mg at 08/15/20 2153   QUEtiapine (SEROQUEL) tablet 50 mg  50 mg Oral QHS Regalado, Belkys A, MD   50 mg at 08/15/20 2156   saccharomyces boulardii (FLORASTOR) capsule 250 mg  250 mg Oral BID Regalado, Belkys A, MD   250 mg at 08/16/20 0842   traZODone (DESYREL) tablet 100 mg  100 mg Oral QHS PRN Regalado, Belkys A, MD   100 mg at 08/15/20 2155   venlafaxine XR (EFFEXOR-XR) 24 hr capsule 150 mg  150 mg Oral Q breakfast Regalado, Belkys A, MD   150 mg at 08/16/20 0844   witch hazel-glycerin (TUCKS) pad   Topical PRN Elmarie Shiley, MD   Given at 08/12/20 2151     Discharge Medications: Please see discharge summary for a list of discharge medications.  Relevant Imaging Results:  Relevant Lab Results:   Additional Information SS#[id number] [phone], Juliann Pulse, [place]

## 2020-08-17 DIAGNOSIS — F32A Depression, unspecified: Secondary | ICD-10-CM

## 2020-08-17 LAB — GLUCOSE, CAPILLARY
Glucose-Capillary: 167 mg/dL — ABNORMAL HIGH (ref 70–99)
Glucose-Capillary: 242 mg/dL — ABNORMAL HIGH (ref 70–99)
Glucose-Capillary: 200 mg/dL — ABNORMAL HIGH (ref 70–99)
Glucose-Capillary: 100 mg/dL — ABNORMAL HIGH (ref 70–99)

## 2020-08-17 LAB — SARS CORONAVIRUS 2 (TAT 6-24 HRS): SARS Coronavirus 2: NEGATIVE

## 2020-08-17 MED ORDER — HYDROCORTISONE 1 % EX CREA
TOPICAL_CREAM | Freq: Two times a day (BID) | CUTANEOUS | Status: DC
  Administered 2020-08-17: 1 via TOPICAL
  Filled 2020-08-17: qty 28

## 2020-08-17 NOTE — TOC Progression Note (Addendum)
Transition of Care [facility]) - Progression Note    Patient Details  Name: Joan [name] MRN: [id number] Date of Birth: 26-Dec-1960  [facility]) CM/SW Contact  Ross Ludwig, [place] Phone Number: 08/17/2020, 3:39 PM  Clinical Narrative:     CSW attempted to contact patient to discuss SNF bed offers.  CSW had to leave message on patient's voice mail waiting for a call back.  Insurance auth started reference number [id number].  CSW to follow up on bed choice for patient tomorrow.  CSW to continue to follow patient's progress throughout discharge planning.   Expected Discharge Plan: [place] Barriers to Discharge: Continued Medical Work up  Expected Discharge Plan and Services Expected Discharge Plan: [place]   Discharge Planning Services: CM Consult Post Acute Care Choice: [place] Living arrangements for the past 2 months: Single Family Home                                       Social Determinants of Health (SDOH) Interventions    Readmission Risk Interventions Readmission Risk Prevention Plan 04/07/2019  Transportation Screening Complete  PCP or Specialist Appt within 3-5 Days Complete  HRI or [place] Complete  Social Work Consult for [place] Planning/Counseling Complete  Palliative Care Screening Not Applicable  Medication Review Press photographer) Complete  Some recent data might be hidden

## 2020-08-17 NOTE — Progress Notes (Addendum)
PROGRESS NOTE  Joan [name] BPZ:[id number] DOB: 04-15-1960 DOA: 08/07/2020 PCP: [name], MD  Brief History  60 year old woman PMH COPD, chronic hypoxic respiratory failure on 3 L presented with shortness of breath.  Admitted for COPD exacerbation. Now ready for SNF.   * COPD exacerbation ([place]) --improving, continue steroid taper and bronchdilators --continue supplemental oxygen --stop smoking   Steroid-induced hyperglycemia --improved with less steroid, may not need insulin --stop long-acting insulin, continue SSI  Depression --stable, continue Wellbutrin, Effexor, Seroquel  Hypothyroidism due to acquired atrophy of thyroid --continue Synthroid  Essential hypertension --stable, continue bisoprolol, diuretic, hydralazine  OSA on CPAP --continue CPAP QHS  Skin Assessment: I agree with the wound assessment as performed by the wound care RN as outlined below: Pressure Injury 08/15/20 Deep Tissue Pressure Injury - Purple or maroon localized area of discolored intact skin or blood-filled blister due to damage of underlying soft tissue from pressure and/or shear. (Active)  08/15/20   Location:   Location Orientation:   Staging: Deep Tissue Pressure Injury - Purple or maroon localized area of discolored intact skin or blood-filled blister due to damage of underlying soft tissue from pressure and/or shear.  Wound Description (Comments):   Present on Admission: Yes   Disposition Plan:  Discussion: improving, stable for SNF 6/24  Status is: Inpatient  Remains inpatient appropriate because:Inpatient level of care appropriate due to severity of illness  Dispo: The patient is from: Home              Anticipated d/c is to: SNF              Patient currently is not medically stable to d/c.   Difficult to place patient No  DVT prophylaxis: Place TED hose Start: 08/10/20 1232 enoxaparin (LOVENOX) injection 40 mg Start: 08/08/20 1000   Code Status: Full  Code Level of care: Med-Surg Family Communication:    [name], MD  [facility] Direct contact: see [email or web address] (further directions at bottom of note if needed) 7PM-7AM contact night coverage as at bottom of note 08/17/2020, 2:09 PM  LOS: 10 days    Interval History/Subjective  CC: f/u SOB  Feels better today, breathing better, moving around better  Review of Systems  Gastrointestinal:  Negative for nausea (eating well) and vomiting.   Objective   Vitals:  Vitals:   08/17/20 0525 08/17/20 0819  BP: 122/79   Pulse: 72 96  Resp: 18 (!) 22  Temp: 97.7 F (36.5 C)   SpO2: 100% 98%    Exam: Physical Exam Vitals and nursing note reviewed.  Constitutional:      General: She is not in acute distress.    Appearance: Normal appearance. She is not ill-appearing.  Cardiovascular:     Rate and Rhythm: Normal rate and regular rhythm.  Pulmonary:     Effort: Pulmonary effort is normal. No respiratory distress.     Breath sounds: No wheezing or rales.     Comments: Diminished breath sounds Neurological:     Mental Status: She is alert.  Psychiatric:        Mood and Affect: Mood normal.        Behavior: Behavior normal.    I have personally reviewed the labs and other data, making special note of:   Today's Data  CBG improved today, fasting 167  Scheduled Meds:  acyclovir  400 mg Oral BID   atorvastatin  20 mg Oral Daily   bisoprolol  5 mg Oral Daily  budesonide (PULMICORT) nebulizer solution  0.5 mg Nebulization BID   buPROPion  150 mg Oral Daily   chlorhexidine  15 mL Mouth Rinse BID   Chlorhexidine Gluconate Cloth  6 each Topical Daily   enoxaparin (LOVENOX) injection  40 mg Subcutaneous Q24H   fluticasone  1 spray Each Nare Daily   furosemide  20 mg Oral Daily   guaiFENesin  600 mg Oral QID   hydrALAZINE  50 mg Oral Q8H   hydrocortisone  25 mg Rectal BID   hydrocortisone cream   Topical BID   insulin aspart  0-15 Units Subcutaneous TID WC    insulin aspart  0-5 Units Subcutaneous QHS   insulin aspart  5 Units Subcutaneous TID WC   ipratropium-albuterol  3 mL Nebulization TID   levothyroxine  25 mcg Oral Q0600   loratadine  10 mg Oral Daily   mouth rinse  15 mL Mouth Rinse q12n4p   montelukast  10 mg Oral QHS   mupirocin cream   Topical Daily   nicotine  14 mg Transdermal Daily   olopatadine  1 drop Both Eyes BID   pantoprazole  40 mg Oral BID   predniSONE  40 mg Oral Q breakfast   pregabalin  150 mg Oral QHS   QUEtiapine  50 mg Oral QHS   saccharomyces boulardii  250 mg Oral BID   venlafaxine XR  150 mg Oral Q breakfast   Continuous Infusions:  sodium chloride Stopped (08/17/20 0600)    Principal Problem:   COPD exacerbation (HCC) Active Problems:   Steroid-induced hyperglycemia   Essential hypertension   Hypothyroidism due to acquired atrophy of thyroid   Depression   OSA on CPAP   LOS: 10 days   How to contact the [facility] Attending or Consulting provider 7A - 7P or covering provider during after hours 7P -7A, for this patient?  Check the care team in [facility] and look for a) attending/consulting TRH provider listed and b) the [facility] team listed Log into [email or web address] and use [place]'s universal password to access. If you do not have the password, please contact the hospital operator. Locate the [facility] provider you are looking for under [facility] and page to a number that you can be directly reached. If you still have difficulty reaching the provider, please page the [facility] (Director on Call) for the Hospitalists listed on amion for assistance.

## 2020-08-17 NOTE — Assessment & Plan Note (Signed)
--  stable, continue bisoprolol, diuretic, hydralazine

## 2020-08-17 NOTE — Assessment & Plan Note (Signed)
-    continue Synthroid. 

## 2020-08-17 NOTE — Progress Notes (Signed)
Physical Therapy Treatment Patient Details Name: Joan [name] MRN: [id number] DOB: September 29, 1960 Today's Date: 08/17/2020    History of Present Illness Patient is 60 y.o. female admitted on 08/07/2020 with COPD exacerbation with PMH significant for HTN, hypothyroidism, GERD, COPD, DM, chronic pain, anxiety, depression, PTSD, bipolar d/o, smoker, Lt THR on 01/26/19, s/p laproscopic appendectomy on 03/31/19 and OA.    PT Comments    Pt limited by tired feeling, ambulates 40 ft with RW. Pt on 3L O2, stops every 2-3 steps to converse, educated pt on focus on ambulation with decreased conversation with guarded carryover. Nurse tech brought chair at end of ambulation due to pt reporting concern that she won't make it back to her room. Cued pt through BLE strengthening exercises with good motor control. Will continue to progress acute PT as able.   Follow Up Recommendations  SNF     Equipment Recommendations  None recommended by PT    Recommendations for Other Services       Precautions / Restrictions Precautions Precautions: Fall Precaution Comments: 3L O2 baseline Restrictions Weight Bearing Restrictions: No    Mobility  Bed Mobility  General bed mobility comments: sitting EOB    Transfers Overall transfer level: Needs assistance Equipment used: Rolling walker (2 wheeled) Transfers: Sit to/from Stand Sit to Stand: Supervision    General transfer comment: supv to power to stand for safety  Ambulation/Gait Ambulation/Gait assistance: Supervision Gait Distance (Feet): 40 Feet Assistive device: Rolling walker (2 wheeled) Gait Pattern/deviations: Step-through pattern;Decreased stride length;Trunk flexed Gait velocity: significantly decreased   General Gait Details: pt requires significantly increased time taking slow steps, stopping after 2-3 steps for rest breaks, VCs for decreasing conversation to conserve energy, limited by "I just feel tired today"   Stairs              Wheelchair Mobility    Modified Rankin (Stroke Patients Only)       Balance Overall balance assessment: Needs assistance;History of Falls Sitting-balance support: Feet supported Sitting balance-Leahy Scale: Good Sitting balance - Comments: sitting EOB   Standing balance support: During functional activity;Bilateral upper extremity supported Standing balance-Leahy Scale: Poor Standing balance comment: reliant on UE support during therapy         Cognition Arousal/Alertness: Awake/alert Behavior During Therapy: WFL for tasks assessed/performed Overall Cognitive Status: Within Functional Limits for tasks assessed  General Comments: Pt very conversational, appears unaware of functional deficits, requesting for reacher/grabber be placed in room so she can be independent, but also states nursing needs to turn her so she stops getting pressure sores. Therapist offered encouragement and education regarding mobility and safety, RN aware.      Exercises General Exercises - Lower Extremity Long Arc Quad: Seated;AROM;Strengthening;Both;10 reps Hip Flexion/Marching: Seated;AROM;Strengthening;Both;10 reps    General Comments        Pertinent Vitals/Pain Pain Assessment: No/denies pain    Home Living                      Prior Function            PT Goals (current goals can now be found in the care plan section) Acute Rehab PT Goals Patient Stated Goal: More help at home. PT Goal Formulation: With patient Time For Goal Achievement: 08/25/20 Potential to Achieve Goals: Fair Progress towards PT goals: Progressing toward goals    Frequency    Min 3X/week      PT Plan Current plan remains appropriate    Co-evaluation  AM-PAC PT "6 Clicks" Mobility   Outcome Measure  Help needed turning from your back to your side while in a flat bed without using bedrails?: A Little Help needed moving from lying on your back to sitting on the side of a  flat bed without using bedrails?: A Little Help needed moving to and from a bed to a chair (including a wheelchair)?: A Little Help needed standing up from a chair using your arms (e.g., wheelchair or bedside chair)?: A Little Help needed to walk in hospital room?: A Little Help needed climbing 3-5 steps with a railing? : A Lot 6 Click Score: 17    End of Session Equipment Utilized During Treatment: Oxygen;Gait belt Activity Tolerance: Patient limited by fatigue Patient left: in chair;with call bell/phone within reach Nurse Communication: Mobility status PT Visit Diagnosis: Muscle weakness (generalized) (M62.81);Difficulty in walking, not elsewhere classified (R26.2)     Time: 8295-6213 PT Time Calculation (min) (ACUTE ONLY): 22 min  Charges:  $Gait Training: 8-22 mins                      Tori Tessie Ordaz PT, DPT 08/17/20, 12:18 PM

## 2020-08-17 NOTE — Assessment & Plan Note (Addendum)
--   Resolved.  Continue oxygen.  Stop smoking.  Steroid taper complete.

## 2020-08-17 NOTE — Assessment & Plan Note (Signed)
--  continue CPAP QHS 

## 2020-08-17 NOTE — Assessment & Plan Note (Addendum)
--   stable, continue SSI and meal coverage; steroids complete

## 2020-08-17 NOTE — TOC Progression Note (Addendum)
Transition of Care [facility]) - Progression Note    Patient Details  Name: Kadedra Vanaken MRN: [id number] Date of Birth: 12/23/1960  [facility]) CM/SW Contact  Ross Ludwig, [place] Phone Number: 08/17/2020, 12:00 PM  Clinical Narrative:    CSW was informed that [facility] needs updated clinicals.  CSW sent requested clinicals to [facility].  CSW to provide bed offers and start insurance authorization.  3:15pm  CSW received [facility] number back.  [id number] E expiration 09/16/20, CSW to continue to facilitate discharge planning.   Expected Discharge Plan: [place] Barriers to Discharge: Continued Medical Work up  Expected Discharge Plan and Services Expected Discharge Plan: [place]   Discharge Planning Services: CM Consult Post Acute Care Choice: [place] Living arrangements for the past 2 months: Single Family Home                                       Social Determinants of Health (SDOH) Interventions    Readmission Risk Interventions Readmission Risk Prevention Plan 04/07/2019  Transportation Screening Complete  PCP or Specialist Appt within 3-5 Days Complete  HRI or [place] Complete  Social Work Consult for [place] Planning/Counseling Complete  Palliative Care Screening Not Applicable  Medication Review Press photographer) Complete  Some recent data might be hidden

## 2020-08-17 NOTE — Plan of Care (Signed)
  Problem: Education: Goal: Knowledge of General Education information will improve Description: Including pain rating scale, medication(s)/side effects and non-pharmacologic comfort measures Outcome: Progressing   Problem: Health Behavior/Discharge Planning: Goal: Ability to manage health-related needs will improve Outcome: Progressing   Problem: Clinical Measurements: Goal: Ability to maintain clinical measurements within normal limits will improve Outcome: Progressing Goal: Will remain free from infection Outcome: Progressing Goal: Diagnostic test results will improve Outcome: Progressing Goal: Respiratory complications will improve Outcome: Progressing Goal: Cardiovascular complication will be avoided Outcome: Progressing   Problem: Coping: Goal: Level of anxiety will decrease Outcome: Progressing   Problem: Safety: Goal: Ability to remain free from injury will improve Outcome: Progressing   Problem: Skin Integrity: Goal: Risk for impaired skin integrity will decrease Outcome: Progressing   

## 2020-08-17 NOTE — Assessment & Plan Note (Signed)
--  stable, continue Wellbutrin, Effexor, Seroquel

## 2020-08-18 DIAGNOSIS — E118 Type 2 diabetes mellitus with unspecified complications: Secondary | ICD-10-CM

## 2020-08-18 DIAGNOSIS — Z9989 Dependence on other enabling machines and devices: Secondary | ICD-10-CM

## 2020-08-18 DIAGNOSIS — G4733 Obstructive sleep apnea (adult) (pediatric): Secondary | ICD-10-CM

## 2020-08-18 LAB — GLUCOSE, CAPILLARY
Glucose-Capillary: 162 mg/dL — ABNORMAL HIGH (ref 70–99)
Glucose-Capillary: 196 mg/dL — ABNORMAL HIGH (ref 70–99)
Glucose-Capillary: 351 mg/dL — ABNORMAL HIGH (ref 70–99)
Glucose-Capillary: 115 mg/dL — ABNORMAL HIGH (ref 70–99)

## 2020-08-18 MED ORDER — PREDNISONE 10 MG PO TABS: ORAL_TABLET | ORAL | 0 refills | Status: DC

## 2020-08-18 MED ORDER — WITCH HAZEL-GLYCERIN EX PADS: MEDICATED_PAD | CUTANEOUS | Status: DC | PRN

## 2020-08-18 MED ORDER — ALPRAZOLAM 0.5 MG PO TABS: 0.5000 mg | ORAL_TABLET | Freq: Three times a day (TID) | ORAL | 0 refills | Status: DC | PRN

## 2020-08-18 MED ORDER — COMBIVENT RESPIMAT 20-100 MCG/ACT IN AERS: 1.0000 | INHALATION_SPRAY | Freq: Four times a day (QID) | RESPIRATORY_TRACT | Status: DC | PRN

## 2020-08-18 MED ORDER — BISOPROLOL FUMARATE 5 MG PO TABS: 5.0000 mg | ORAL_TABLET | Freq: Every day | ORAL | Status: DC

## 2020-08-18 MED ORDER — HYDROCORTISONE ACETATE 25 MG RE SUPP: 25.0000 mg | Freq: Two times a day (BID) | RECTAL | Status: DC

## 2020-08-18 MED ORDER — MUPIROCIN CALCIUM 2 % EX CREA
TOPICAL_CREAM | Freq: Every day | CUTANEOUS | Status: DC
Start: 2020-08-19 — End: 2021-03-19

## 2020-08-18 MED ORDER — NAPROXEN 500 MG PO TABS: 500.0000 mg | ORAL_TABLET | Freq: Two times a day (BID) | ORAL | Status: DC | PRN

## 2020-08-18 NOTE — Assessment & Plan Note (Addendum)
--  SSI, start metformin on discharge, may need second agent or even longacting insulin, will defer longacting insulin for now. Steroid taper complete today. --attention to diabetic teaching, self-CBG checks, diet-education and close outpatient follow-up

## 2020-08-18 NOTE — Discharge Summary (Addendum)
Physician Discharge Summary  Ermina Oberman WUJ:[id number] DOB: June 11, 1960 DOA: 08/07/2020  PCP: [name], MD  Admit date: 08/07/2020 Discharge date: 08/22/2020  Recommendations for Outpatient Follow-up:   Steroid-induced hyperglycemia -- Monitor off today.  Now.  May not need insulin.  Close follow-up at SNF to determine antihyperglycemic needs.  Type II diabetes mellitus with manifestations ([place]) --new diagnosis, recommend SSI, continue metformin, may need second agent or even longacting insulin, will defer longacting insulin for now --attention to diabetic teaching, self-CBG checks, diet-education and close outpatient follow-up  Anxiety. Treated with Xanax TID PRN here, recommend short-term only and would not suggest discharge from SNF on this medication unless managed by her psychiatrist. Recommend taper at SNF  Stop smoking    Contact information for follow-up providers     Parrett, Fonnie Mu, NP Follow up on 09/04/2020.   Specialty: Pulmonary Disease Why: at 12:00pm Contact information: [address] [place] [address] [phone]         Loel Dubonnet, NP Follow up on 09/01/2020.   Specialty: Cardiology Why: please note the address for appt is [address]  [place]  [address]  it is in the Med center building.  and it is with his Nurse Practitioner Laurann [place]. it is at 1:30 PM Contact information: [address]  [place] [address] [phone]         [name], MD. Schedule an appointment as soon as possible for a visit in 2 week(s).   Specialty: Internal Medicine Contact information: [place] [address] [phone]              Contact information for after-discharge care     Destination     [facility] .   Service: Skilled Nursing Contact information: [place] [phone]                       Discharge Diagnoses: Principal diagnosis is #1 Principal Problem:   COPD exacerbation ([place]) Active Problems:   Type II diabetes mellitus with manifestations ([place])   Steroid-induced hyperglycemia   Anxiety   Essential hypertension   Hypothyroidism due to acquired atrophy of thyroid   Depression   OSA on CPAP   Discharge Condition: improved Disposition: short-term SNF  Diet recommendation:  Diet Orders (From admission, onward)     Start     Ordered   08/18/20 0000  Diet - low sodium heart healthy        08/18/20 1242   08/17/20 1102  Diet regular Room service appropriate? Yes; Fluid consistency: Thin  Diet effective now       Question Answer Comment  Room service appropriate? Yes   Fluid consistency: Thin      08/17/20 1101             Filed Weights   08/07/20 1850 08/15/20 0700  Weight: 108.9 kg 110.3 kg    HPI/Hospital Course:   60 year old woman PMH COPD, chronic hypoxic respiratory failure on 3 L presented with shortness of breath.  Admitted for COPD exacerbation. Treated for same with gradual improvement, seen by PT w/ recommendation for SNF.  Discharge delayed 4 days pending insurance appeal.   * COPD exacerbation ([place]) -- Resolved.  Continue oxygen.  Stop smoking.  Steroid taper complete.  Steroid-induced hyperglycemia -- stable, continue SSI and meal coverage; steroids complete  Type II diabetes mellitus with  manifestations ([place]) --SSI, start metformin on discharge, may need second agent or even longacting insulin, will defer longacting insulin for now. Steroid taper complete today. --attention to diabetic teaching, self-CBG checks, diet-education and close outpatient follow-up  Depression --stable, continue Wellbutrin, Effexor, Seroquel  Hypothyroidism due to acquired atrophy of thyroid --continue Synthroid  Essential hypertension --stable, continue bisoprolol, diuretic, hydralazine  Anxiety --stable. --Xanax TID PRN but  would suggest wean over next 2 weeks.   OSA on CPAP --continue CPAP QHS  Anixety treated w/ Xanax here, recommend short-term only, would suggest wean at SNF.  6/24 assessment (see 6/28 progress note for discharge day assessment): S: CC: f/u SOB  Feels better, breathing better  O: Vitals:  Vitals:   08/22/20 0516 08/22/20 0851  BP: 120/82   Pulse: 90   Resp: 19   Temp: (!) 97.4 F (36.3 C)   SpO2: 98% 92%    Constitutional:  Appears calm and comfortable Respiratory:  Generally clear with few wheezes, normal respiratory effort Respiratory effort normal.  Cardiovascular:  RRR, no m/r/g No LE extremity edema   Psychiatric:  Mental status Mood, affect appropriate  CBG stable  Discharge Instructions  Discharge Instructions     Diet - low sodium heart healthy   Complete by: As directed    Discharge wound care:   Complete by: As directed    Wound care  Daily      Comments: Apply Bactroban to right great toe wound Q day, then cover with foam dressing.  (Change foam dressing Q 3 days or PRN soiling.)  08/15/20 1252     08/15/20 1249    Foam dressing  Until discontinued      Comments: Foam dressing to left heel, change Q 3 days or PRN soiling      Allergies as of 08/22/2020       Reactions   Keflex [cephalexin] Rash, Other (See Comments)   Has tolerated amoxicillin since had keflex   Prednisone Other (See Comments)   "counter reacts"   Shellfish Allergy Shortness Of Breath, Nausea And Vomiting, Other (See Comments)   Stomach cramps   Tetracyclines & Related Anaphylaxis, Swelling, Other (See Comments)   Throat swelling requiring hospitalization   Dilaudid [hydromorphone Hcl] Other (See Comments)   "Lethargy"   Incruse Ellipta [umeclidinium Bromide] Nausea Only   Other    Tuberculin Tests Other (See Comments)   False postive        Medication List     STOP taking these medications    acetaminophen 500 MG tablet Commonly known as: TYLENOL    atenolol 25 MG tablet Commonly known as: TENORMIN   budesonide-formoterol 160-4.5 MCG/ACT inhaler Commonly known as: SYMBICORT   cyclobenzaprine 10 MG tablet Commonly known as: FLEXERIL   diazepam 5 MG tablet Commonly known as: VALIUM       TAKE these medications    acyclovir 400 MG tablet Commonly known as: ZOVIRAX TAKE 1 TABLET BY MOUTH  TWICE DAILY   albuterol (2.5 MG/3ML) 0.083% nebulizer solution Commonly known as: PROVENTIL Take 3 mLs (2.5 mg total) by nebulization 2 (two) times daily. And every 6 hours as needed.  DX: J44.9 What changed: Another medication with the same name was changed. Make sure you understand how and when to take each.   albuterol 108 (90 Base) MCG/ACT inhaler Commonly known as: VENTOLIN HFA Inhale 2 puffs into the lungs every 6 (six) hours as needed for wheezing or shortness of breath. What changed: when to take this  ALPRAZolam 0.5 MG tablet Commonly known as: XANAX Take 1 tablet (0.5 mg total) by mouth 3 (three) times daily as needed for anxiety.   atorvastatin 20 MG tablet Commonly known as: LIPITOR TAKE 1 TABLET BY MOUTH  DAILY   bisoprolol 5 MG tablet Commonly known as: ZEBETA Take 1 tablet (5 mg total) by mouth daily.   Breztri Aerosphere 160-9-4.8 MCG/ACT Aero Generic drug: Budeson-Glycopyrrol-Formoterol Inhale 2 puffs into the lungs in the morning and at bedtime.   buPROPion 150 MG 24 hr tablet Commonly known as: WELLBUTRIN XL Take 1 tablet (150 mg total) by mouth daily.   Camphor-Menthol-Methyl Sal 3.03-02-08 % Ptch Apply 1 patch topically daily as needed (pain).   Combivent Respimat 20-100 MCG/ACT Aers respimat Generic drug: Ipratropium-Albuterol Inhale 1 puff into the lungs every 6 (six) hours as needed for wheezing or shortness of breath.   Daliresp 500 MCG Tabs tablet Generic drug: roflumilast Take 1 tablet (500 mcg total) by mouth daily.   hydrochlorothiazide 25 MG tablet Commonly known as: HYDRODIURIL TAKE 1  TABLET BY MOUTH  DAILY   hydrocortisone 25 MG suppository Commonly known as: ANUSOL-HC Place 1 suppository (25 mg total) rectally 2 (two) times daily.   levothyroxine 25 MCG tablet Commonly known as: SYNTHROID TAKE 1 TABLET BY MOUTH  DAILY BEFORE BREAKFAST   metFORMIN 500 MG tablet Commonly known as: Glucophage Take 1 tablet (500 mg total) by mouth 2 (two) times daily with a meal.   montelukast 10 MG tablet Commonly known as: SINGULAIR TAKE 1 TABLET BY MOUTH AT  BEDTIME   mupirocin cream 2 % Commonly known as: BACTROBAN Apply topically daily.   naproxen 500 MG tablet Commonly known as: NAPROSYN Take 1 tablet (500 mg total) by mouth 2 (two) times daily as needed for moderate pain.   nystatin-triamcinolone ointment Commonly known as: MYCOLOG Apply 1 application topically 2 (two) times daily as needed (applied to skin folds/groins/under breast skin irritation rash.). What changed: reasons to take this   OXYGEN Inhale 2-3 L/min into the lungs See admin instructions. Inhale  2-3 L/min of oxygen into the lungs w/CPAP at bedtime and as needed for shortness of breath with "strenuous activity" during the day   pantoprazole 40 MG tablet Commonly known as: PROTONIX TAKE 1 TABLET BY MOUTH  DAILY   Pataday 0.2 % Soln Generic drug: Olopatadine HCl Apply 1 drop to eye daily.   polyethylene glycol 17 g packet Commonly known as: MIRALAX / GLYCOLAX Take 17 g by mouth daily as needed for mild constipation.   pregabalin 150 MG capsule Commonly known as: LYRICA TAKE 1 CAPSULE BY MOUTH AT  BEDTIME   QUEtiapine 50 MG tablet Commonly known as: SEROQUEL Take 1 tablet (50 mg total) by mouth at bedtime.   SALONPAS ARTHRITIS PAIN RELIEF EX Apply 1 patch topically daily as needed (to painful sites- remove as directed).   traZODone 100 MG tablet Commonly known as: DESYREL TAKE 1 TABLET BY MOUTH AT  BEDTIME   venlafaxine XR 150 MG 24 hr capsule Commonly known as: EFFEXOR-XR TAKE 1  CAPSULE BY MOUTH  DAILY WITH BREAKFAST   witch hazel-glycerin pad Commonly known as: TUCKS Apply topically as needed for itching.               Discharge Care Instructions  (From admission, onward)           Start     Ordered   08/18/20 0000  Discharge wound care:       Comments:  Wound care  Daily      Comments: Apply Bactroban to right great toe wound Q day, then cover with foam dressing.  (Change foam dressing Q 3 days or PRN soiling.)  08/15/20 1252     08/15/20 1249    Foam dressing  Until discontinued      Comments: Foam dressing to left heel, change Q 3 days or PRN soiling   08/18/20 1242           Allergies  Allergen Reactions   Keflex [Cephalexin] Rash and Other (See Comments)    Has tolerated amoxicillin since had keflex   Prednisone Other (See Comments)    "counter reacts"   Shellfish Allergy Shortness Of Breath, Nausea And Vomiting and Other (See Comments)    Stomach cramps   Tetracyclines & Related Anaphylaxis, Swelling and Other (See Comments)    Throat swelling requiring hospitalization   Dilaudid [Hydromorphone Hcl] Other (See Comments)    "Lethargy"   Incruse Ellipta [Umeclidinium Bromide] Nausea Only   Other    Tuberculin Tests Other (See Comments)    False postive    The results of significant diagnostics from this hospitalization (including imaging, microbiology, ancillary and laboratory) are listed below for reference.    Significant Diagnostic Studies: DG CHEST PORT 1 VIEW  Result Date: 08/10/2020 CLINICAL DATA:  Respiratory distress. EXAM: PORTABLE CHEST 1 VIEW COMPARISON:  08/07/2020 FINDINGS: 1801 hours. Subtle airspace disease noted right base new since prior. Fat pad noted left base as seen on CT chest 03/08/2020. The cardiopericardial silhouette is within normal limits for size. The visualized bony structures of the thorax show no acute abnormality. Telemetry leads overlie the chest. IMPRESSION: New subtle opacity at the right base  compatible with atelectasis or pneumonia. Electronically Signed   By: Misty Stanley M.D.   On: 08/10/2020 18:18   DG Chest Port 1 View  Result Date: 08/07/2020 CLINICAL DATA:  Shortness of breath, chest pain EXAM: PORTABLE CHEST 1 VIEW COMPARISON:  05/05/2019 FINDINGS: Heart and mediastinal contours are within normal limits. No focal opacities or effusions. No acute bony abnormality. IMPRESSION: No active disease. Electronically Signed   By: Rolm Baptise M.D.   On: 08/07/2020 20:03   ECHOCARDIOGRAM COMPLETE  Result Date: 08/11/2020    ECHOCARDIOGRAM REPORT   Patient Name:   ANAVICTORIA WILK Date of Exam: 08/11/2020 Medical Rec #:  [id number]   Height:       68.0 in Accession #:    [id number]  Weight:       240.0 lb Date of Birth:  Apr 23, 1960    BSA:          2.208 m Patient Age:    60 years    BP:           152/82 mmHg Patient Gender: F           HR:           77 bpm. Exam Location:  Inpatient Procedure: 2D Echo, Cardiac Doppler and Color Doppler Indications:    Dyspnea R06.00  History:        Patient has prior history of Echocardiogram examinations, most                 recent 09/27/2016. COPD; Risk Factors:Hypertension, Diabetes,                 Dyslipidemia, Sleep Apnea and GERD. Hypothyroidism. Alcoholism.  Sonographer:    Jonelle Sidle Dance Referring Phys: [id number] BELKYS A REGALADO IMPRESSIONS  1.  Left ventricular ejection fraction, by estimation, is 60 to 65%. The left ventricle has normal function. The left ventricle has no regional wall motion abnormalities. Left ventricular diastolic parameters were normal.  2. Right ventricular systolic function is normal. The right ventricular size is normal.  3. The mitral valve is grossly normal. No evidence of mitral valve regurgitation.  4. The aortic valve is grossly normal. Aortic valve regurgitation is not visualized. No aortic stenosis is present. FINDINGS  Left Ventricle: Left ventricular ejection fraction, by estimation, is 60 to 65%. The left ventricle has normal  function. The left ventricle has no regional wall motion abnormalities. The left ventricular internal cavity size was normal in size. There is  no left ventricular hypertrophy. Left ventricular diastolic parameters were normal. Right Ventricle: The right ventricular size is normal. Right vetricular wall thickness was not well visualized. Right ventricular systolic function is normal. Left Atrium: Left atrial size was normal in size. Right Atrium: Right atrial size was normal in size. Pericardium: There is no evidence of pericardial effusion. Mitral Valve: The mitral valve is grossly normal. No evidence of mitral valve regurgitation. Tricuspid Valve: The tricuspid valve is grossly normal. Tricuspid valve regurgitation is trivial. Aortic Valve: The aortic valve is grossly normal. Aortic valve regurgitation is not visualized. No aortic stenosis is present. Pulmonic Valve: The pulmonic valve was grossly normal. Pulmonic valve regurgitation is not visualized. Aorta: The aortic root and ascending aorta are structurally normal, with no evidence of dilitation. IAS/Shunts: The atrial septum is grossly normal.  LEFT VENTRICLE PLAX 2D LVIDd:         4.40 cm Diastology LVIDs:         2.90 cm LV e' medial:    8.16 cm/s LV PW:         1.20 cm LV E/e' medial:  11.9 LV IVS:        0.80 cm LV e' lateral:   10.60 cm/s                        LV E/e' lateral: 9.1  RIGHT VENTRICLE             IVC RV Basal diam:  2.90 cm     IVC diam: 2.30 cm RV S prime:     15.30 cm/s TAPSE (M-mode): 2.6 cm LEFT ATRIUM             Index       RIGHT ATRIUM           Index LA diam:        2.80 cm 1.27 cm/m  RA Area:     13.40 cm LA Vol (A2C):   55.1 ml 24.95 ml/m RA Volume:   26.30 ml  11.91 ml/m LA Vol (A4C):   38.6 ml 17.48 ml/m LA Biplane Vol: 46.8 ml 21.19 ml/m  AORTIC VALVE LVOT Vmax:   100.60 cm/s LVOT Vmean:  62.750 cm/s LVOT VTI:    0.198 m  AORTA Ao Asc diam: 3.40 cm MITRAL VALVE MV Area (PHT): 3.85 cm    SHUNTS MV Decel Time: 197 msec     Systemic VTI: 0.20 m MV E velocity: 96.70 cm/s MV A velocity: 92.40 cm/s MV E/A ratio:  1.05 Mertie Moores MD Electronically signed by Mertie Moores MD Signature Date/Time: 08/11/2020/5:13:58 PM    Final     Microbiology: Recent Results (from the past 240 hour(s))  Respiratory (~20 pathogens) panel by PCR     Status:  None   Collection Time: 08/12/20 12:25 PM   Specimen: Nasopharyngeal Swab; Respiratory  Result Value Ref Range Status   Adenovirus NOT DETECTED NOT DETECTED Final   Coronavirus 229E NOT DETECTED NOT DETECTED Final    Comment: (NOTE) The Coronavirus on the Respiratory Panel, DOES NOT test for the novel  Coronavirus (2019 nCoV)    Coronavirus HKU1 NOT DETECTED NOT DETECTED Final   Coronavirus NL63 NOT DETECTED NOT DETECTED Final   Coronavirus OC43 NOT DETECTED NOT DETECTED Final   Metapneumovirus NOT DETECTED NOT DETECTED Final   Rhinovirus / Enterovirus NOT DETECTED NOT DETECTED Final   Influenza A NOT DETECTED NOT DETECTED Final   Influenza B NOT DETECTED NOT DETECTED Final   Parainfluenza Virus 1 NOT DETECTED NOT DETECTED Final   Parainfluenza Virus 2 NOT DETECTED NOT DETECTED Final   Parainfluenza Virus 3 NOT DETECTED NOT DETECTED Final   Parainfluenza Virus 4 NOT DETECTED NOT DETECTED Final   Respiratory Syncytial Virus NOT DETECTED NOT DETECTED Final   Bordetella pertussis NOT DETECTED NOT DETECTED Final   Bordetella Parapertussis NOT DETECTED NOT DETECTED Final   Chlamydophila pneumoniae NOT DETECTED NOT DETECTED Final   Mycoplasma pneumoniae NOT DETECTED NOT DETECTED Final    Comment: Performed at [facility], [place]. [address], [place], [place] [address]  SARS CORONAVIRUS 2 (TAT 6-24 HRS) Nasopharyngeal Nasopharyngeal Swab     Status: None   Collection Time: 08/17/20 12:40 PM   Specimen: Nasopharyngeal Swab  Result Value Ref Range Status   SARS Coronavirus 2 NEGATIVE NEGATIVE Final    Comment: (NOTE) SARS-CoV-2 target nucleic acids are NOT DETECTED.  The  SARS-CoV-2 RNA is generally detectable in upper and lower respiratory specimens during the acute phase of infection. Negative results do not preclude SARS-CoV-2 infection, do not rule out co-infections with other pathogens, and should not be used as the sole basis for treatment or other patient management decisions. Negative results must be combined with clinical observations, patient history, and epidemiological information. The expected result is Negative.  Fact Sheet for Patients: SugarRoll.be  Fact Sheet for Healthcare Providers: https://www.woods-mathews.com/  This test is not yet approved or cleared by the [place] FDA and  has been authorized for detection and/or diagnosis of SARS-CoV-2 by FDA under an Emergency Use Authorization (EUA). This EUA will remain  in effect (meaning this test can be used) for the duration of the COVID-19 declaration under Se ction 564(b)(1) of the Act, 21 U.S.C. section 360bbb-3(b)(1), unless the authorization is terminated or revoked sooner.  Performed at [facility], [place] [address] [place], [place] [address]      Labs: Basic Metabolic Panel: Recent Labs  Lab 08/16/20 0442  PHOS 5.0*   Liver Function Tests: Recent Labs  Lab 08/16/20 0442  AST 20  ALT 26  ALKPHOS 77  BILITOT 0.6  PROT 5.9*  ALBUMIN 3.3*    CBC: No results for input(s): WBC, NEUTROABS, HGB, HCT, MCV, PLT in the last 168 hours.   Recent Labs    08/07/20 1943  BNP 76.7   CBG: Recent Labs  Lab 08/21/20 1224 08/21/20 1639 08/21/20 2101 08/22/20 0806 08/22/20 1128  GLUCAP 243* 192* 200* 275* 229*    Principal Problem:   COPD exacerbation ([facility]) Active Problems:   Type II diabetes mellitus with manifestations ([facility])   Steroid-induced hyperglycemia   Anxiety   Essential hypertension   Hypothyroidism due to acquired atrophy of thyroid   Depression   OSA on CPAP  Time coordinating discharge: 35  minutes  Signed:  [name], MD  [facility]  08/22/2020, 12:20 PM

## 2020-08-18 NOTE — TOC Progression Note (Signed)
Transition of Care [facility]) - Progression Note    Patient Details  Name: Joan [name] MRN: [id number] Date of Birth: 08-Aug-1960  Transition of Care [facility]) CM/SW Contact  Ross Ludwig, [place] Phone Number: 08/18/2020, 5:58 PM  Clinical Narrative:     CSW received phone call from [place], that they are denying patient for short term rehab.  [facility] offered a peer to peer, attending physician completed peer to peer, and [facility] still denied patient.  [facility] called CSW back and reported decision.  Patient is being offered the chance to appeal the SNF denial.  CSW informed patient about option to appeal or go home with home health, and patient decided she would like to appeal.  CSW assisted the patient with contacting [facility] Medicare at [phone] to complete an expedited appeal in which [facility] has 72 hours to review the case and make decision.  CSW updated attending physician and bedside nurse that patient is appealing the decision.  CSW assisted patient with speaking to [facility], the case worker was named Romie Minus (pronounced [name]).  He requested that [facility] fax clinical information to [phone] and reference confirmation ID [id number].  [facility] reference number is [id number].  CSW faxed requested clinicals to [facility] Medicare.  Patient would like to go to either [facility] or [facility] if approved.  CSW spoke to both SNFs they should be able to accept patient if she is approved.  CSW informed patient that if insurance company still denies the appeal, she will have to go home with home health.  Patient expressed understanding.  CSW to continue to follow patient's progress throughout discharge planning.     Expected Discharge Plan: [place] Barriers to Discharge: Continued Medical Work up  Expected Discharge Plan and Services Expected Discharge Plan: [place]   Discharge Planning Services: CM Consult Post Acute Care Choice: [place] Living arrangements for the past 2 months: Single Family Home Expected Discharge Date: 08/18/20                                     Social Determinants of Health (SDOH) Interventions    Readmission Risk Interventions Readmission Risk Prevention Plan 04/07/2019  Transportation Screening Complete  PCP or Specialist Appt within 3-5 Days Complete  HRI or [place] Complete  Social Work Consult for [place] Planning/Counseling Complete  Palliative Care Screening Not Applicable  Medication Review [employer]) Complete  Some recent data might be hidden

## 2020-08-19 LAB — GLUCOSE, CAPILLARY
Glucose-Capillary: 185 mg/dL — ABNORMAL HIGH (ref 70–99)
Glucose-Capillary: 238 mg/dL — ABNORMAL HIGH (ref 70–99)
Glucose-Capillary: 297 mg/dL — ABNORMAL HIGH (ref 70–99)
Glucose-Capillary: 145 mg/dL — ABNORMAL HIGH (ref 70–99)

## 2020-08-19 MED ORDER — PREDNISONE 20 MG PO TABS
20.0000 mg | ORAL_TABLET | Freq: Every day | ORAL | Status: AC
  Administered 2020-08-20 – 2020-08-22 (×3): 20 mg via ORAL
  Filled 2020-08-19 (×4): qty 1

## 2020-08-19 MED ORDER — FUROSEMIDE 20 MG PO TABS
20.0000 mg | ORAL_TABLET | Freq: Once | ORAL | Status: AC
  Administered 2020-08-19: 20 mg via ORAL
  Filled 2020-08-19: qty 1

## 2020-08-19 MED ORDER — FUROSEMIDE 40 MG PO TABS
40.0000 mg | ORAL_TABLET | Freq: Every day | ORAL | Status: DC
  Administered 2020-08-20 – 2020-08-22 (×3): 40 mg via ORAL
  Filled 2020-08-19 (×3): qty 1

## 2020-08-19 NOTE — Progress Notes (Signed)
PROGRESS NOTE  Joan [name] HBZ:[id number] DOB: 1960-10-25 DOA: 08/07/2020 PCP: [name], MD  Brief History  .60 year old woman PMH COPD, chronic hypoxic respiratory failure on 3 L presented with shortness of breath.  Admitted for COPD exacerbation.  Discharged to SNF 6/24, peer-to-peer conducted and denied.  Patient appealed to insurance company 6/24, await final decision.   * COPD exacerbation ([place]) -- Appears resolved.  Continue oxygen.  Stop smoking.  Steroid taper.  Steroid-induced hyperglycemia -- stable, expect to improve as steroid is tapered, continue SSI and meal coverage  Type II diabetes mellitus with manifestations (HCC) --SSI, start metformin on discharge, may need second agent or even longacting insulin, will defer longacting insulin for now --attention to diabetic teaching, self-CBG checks, diet-education and close outpatient follow-up  Depression --stable, continue Wellbutrin, Effexor, Seroquel  Hypothyroidism due to acquired atrophy of thyroid --continue Synthroid  Essential hypertension --stable, continue bisoprolol, diuretic, hydralazine  OSA on CPAP --continue CPAP QHS  Skin Assessment: I agree with the wound assessment as performed by the wound care RN as outlined below: Pressure Injury 08/15/20 Deep Tissue Pressure Injury - Purple or maroon localized area of discolored intact skin or blood-filled blister due to damage of underlying soft tissue from pressure and/or shear. (Active)  08/15/20   Location:   Location Orientation:   Staging: Deep Tissue Pressure Injury - Purple or maroon localized area of discolored intact skin or blood-filled blister due to damage of underlying soft tissue from pressure and/or shear.  Wound Description (Comments):   Present on Admission: Yes   Disposition Plan:  Discussion: remains stable for transfer to SNF  Status is: Inpatient  Remains inpatient appropriate because:Inpatient level of care appropriate  due to severity of illness  Dispo: The patient is from: Home              Anticipated d/c is to: SNF              Patient currently is medically stable to d/c.   Difficult to place patient No  DVT prophylaxis: Place TED hose Start: 08/10/20 1232 enoxaparin (LOVENOX) injection 40 mg Start: 08/08/20 1000   Code Status: Full Code Level of care: Med-Surg Family Communication:    [name], MD  [facility] Direct contact: see [email or web address] (further directions at bottom of note if needed) 7PM-7AM contact night coverage as at bottom of note 08/19/2020, 12:56 PM  LOS: 12 days    Interval History/Subjective  CC: f/u SOB  Reports some abdominal pain at times.  Breathing okay.  Objective   Vitals:  Vitals:   08/19/20 0708 08/19/20 0744  BP: (!) 146/79   Pulse: 86   Resp: 20   Temp: 97.7 F (36.5 C)   SpO2: 100% 98%   Exam: Physical Exam Vitals and nursing note reviewed.  Constitutional:      General: She is not in acute distress.    Appearance: Normal appearance.  Cardiovascular:     Rate and Rhythm: Normal rate and regular rhythm.     Heart sounds: No murmur heard. Pulmonary:     Effort: Pulmonary effort is normal. No respiratory distress.     Breath sounds: Wheezing present. No rales.     Comments: Speaks in paragraphs Abdominal:     General: There is no distension.     Palpations: Abdomen is soft.     Tenderness: There is no abdominal tenderness.  Neurological:     Mental Status: She is alert.  Psychiatric:  Mood and Affect: Mood normal.        Behavior: Behavior normal.    I have personally reviewed the labs and other data, making special note of:   Today's Data  CBG labile   Scheduled Meds:  acyclovir  400 mg Oral BID   atorvastatin  20 mg Oral Daily   bisoprolol  5 mg Oral Daily   budesonide (PULMICORT) nebulizer solution  0.5 mg Nebulization BID   buPROPion  150 mg Oral Daily   chlorhexidine  15 mL Mouth Rinse BID   Chlorhexidine  Gluconate Cloth  6 each Topical Daily   enoxaparin (LOVENOX) injection  40 mg Subcutaneous Q24H   fluticasone  1 spray Each Nare Daily   [START ON 08/20/2020] furosemide  40 mg Oral Daily   guaiFENesin  600 mg Oral QID   hydrALAZINE  50 mg Oral Q8H   hydrocortisone  25 mg Rectal BID   hydrocortisone cream   Topical BID   insulin aspart  0-15 Units Subcutaneous TID WC   insulin aspart  0-5 Units Subcutaneous QHS   insulin aspart  5 Units Subcutaneous TID WC   ipratropium-albuterol  3 mL Nebulization TID   levothyroxine  25 mcg Oral Q0600   loratadine  10 mg Oral Daily   mouth rinse  15 mL Mouth Rinse q12n4p   montelukast  10 mg Oral QHS   mupirocin cream   Topical Daily   nicotine  14 mg Transdermal Daily   olopatadine  1 drop Both Eyes BID   pantoprazole  40 mg Oral BID   [START ON 08/20/2020] predniSONE  20 mg Oral Q breakfast   pregabalin  150 mg Oral QHS   QUEtiapine  50 mg Oral QHS   saccharomyces boulardii  250 mg Oral BID   venlafaxine XR  150 mg Oral Q breakfast   Continuous Infusions:  sodium chloride Stopped (08/17/20 0600)    Principal Problem:   COPD exacerbation (HCC) Active Problems:   Type II diabetes mellitus with manifestations (HCC)   Steroid-induced hyperglycemia   Essential hypertension   Hypothyroidism due to acquired atrophy of thyroid   Depression   OSA on CPAP   LOS: 12 days   How to contact the [facility] Attending or Consulting provider 7A - 7P or covering provider during after hours 7P -7A, for this patient?  Check the care team in [facility] and look for a) attending/consulting TRH provider listed and b) the [facility] team listed Log into [email or web address] and use [place] universal password to access. If you do not have the password, please contact the hospital operator. Locate the [facility] provider you are looking for under [facility] and page to a number that you can be directly reached. If you still have difficulty reaching the provider, please page the [facility]  (Director on Call) for the Hospitalists listed on amion for assistance.

## 2020-08-20 LAB — GLUCOSE, CAPILLARY
Glucose-Capillary: 169 mg/dL — ABNORMAL HIGH (ref 70–99)
Glucose-Capillary: 145 mg/dL — ABNORMAL HIGH (ref 70–99)
Glucose-Capillary: 235 mg/dL — ABNORMAL HIGH (ref 70–99)
Glucose-Capillary: 148 mg/dL — ABNORMAL HIGH (ref 70–99)

## 2020-08-20 MED ORDER — ALPRAZOLAM 0.5 MG PO TABS
0.5000 mg | ORAL_TABLET | Freq: Two times a day (BID) | ORAL | Status: DC | PRN
  Administered 2020-08-20 – 2020-08-21 (×4): 0.5 mg via ORAL
  Filled 2020-08-20 (×3): qty 1

## 2020-08-20 NOTE — Assessment & Plan Note (Addendum)
--  stable. --Xanax TID PRN but would suggest wean over next 2 weeks.

## 2020-08-20 NOTE — TOC Progression Note (Signed)
Transition of Care [facility]) - Progression Note    Patient Details  Name: Joan [name] MRN: [id number] Date of Birth: [birth year]  Transition [facility]) CM/SW Contact  Rolena Knutson, Juliann Pulse, RN Phone Number: 08/20/2020, 12:46 PM  Clinical Narrative: checked website  TC to [facility] for update-appeal still pending-spoke to rep [employer].      Expected Discharge Plan: [place] Barriers to Discharge: Continued Medical Work up  Expected Discharge Plan and Services Expected Discharge Plan: [place]   Discharge Planning Services: CM Consult Post Acute Care Choice: [place] Living arrangements for the past 2 months: Single Family Home Expected Discharge Date: 08/18/20                                     Social Determinants of Health (SDOH) Interventions    Readmission Risk Interventions Readmission Risk Prevention Plan 04/07/2019  Transportation Screening Complete  PCP or Specialist Appt within 3-5 Days Complete  HRI or [place] Complete  Social Work Consult for [place] Planning/Counseling Complete  Palliative Care Screening Not Applicable  Medication Review Press photographer) Complete  Some recent data might be hidden

## 2020-08-20 NOTE — Progress Notes (Addendum)
PROGRESS NOTE  Joan [name] INO:[id number] DOB: [birth year] DOA: 08/07/2020 PCP: [name], MD  Brief History  .60 year old woman PMH COPD, chronic hypoxic respiratory failure on 3 L presented with shortness of breath.  Admitted for COPD exacerbation.  Discharged to SNF 6/24, peer-to-peer conducted and denied.  Patient appealed to insurance company 6/24, await final decision.   * COPD exacerbation ([place]) -- Resolved.  Continue oxygen.  Stop smoking.  Steroid taper continues.  Steroid-induced hyperglycemia -- improving as steroid is tapered, continue SSI and meal coverage  Type II diabetes mellitus with manifestations ([place]) --SSI, start metformin on discharge, may need second agent or even longacting insulin, will defer longacting insulin for now --attention to diabetic teaching, self-CBG checks, diet-education and close outpatient follow-up  Depression --stable, continue Wellbutrin, Effexor, Seroquel  Hypothyroidism due to acquired atrophy of thyroid --continue Synthroid  Essential hypertension --stable, continue bisoprolol, diuretic, hydralazine  Anxiety --stable. --cut Xanax to BID PRN  OSA on CPAP --continue CPAP QHS  Skin Assessment: I agree with the wound assessment as performed by the wound care RN as outlined below: Pressure Injury 08/15/20 Deep Tissue Pressure Injury - Purple or maroon localized area of discolored intact skin or blood-filled blister due to damage of underlying soft tissue from pressure and/or shear. (Active)  08/15/20   Location:   Location Orientation:   Staging: Deep Tissue Pressure Injury - Purple or maroon localized area of discolored intact skin or blood-filled blister due to damage of underlying soft tissue from pressure and/or shear.  Wound Description (Comments):   Present on Admission: Yes   Disposition Plan:  Discussion: remains stable for transfer to SNF  Status is: Inpatient  Remains inpatient appropriate  because:Inpatient level of care appropriate due to severity of illness  Dispo: The patient is from: Home              Anticipated d/c is to: SNF              Patient currently is medically stable to d/c.   Difficult to place patient No  DVT prophylaxis: Place TED hose Start: 08/10/20 1232 enoxaparin (LOVENOX) injection 40 mg Start: 08/08/20 1000   Code Status: Full Code Level of care: Med-Surg Family Communication:    [name], MD  [facility] Direct contact: see [email or web address] (further directions at bottom of note if needed) 7PM-7AM contact night coverage as at bottom of note 08/20/2020, 3:08 PM  LOS: 13 days    Interval History/Subjective  CC: f/u SOB  Feels ok today  Objective   Vitals:  Vitals:   08/20/20 1331 08/20/20 1424  BP: 131/69   Pulse: 84   Resp: 20   Temp: 98.1 F (36.7 C)   SpO2: 98% 96%   Exam: Physical Exam Vitals and nursing note reviewed.  Constitutional:      Appearance: Normal appearance.  Cardiovascular:     Rate and Rhythm: Normal rate and regular rhythm.  Pulmonary:     Effort: Pulmonary effort is normal. No respiratory distress.     Breath sounds: Normal breath sounds.  Abdominal:     Tenderness: There is no abdominal tenderness.  Musculoskeletal:     Right lower leg: Edema (1+) present.     Left lower leg: Edema (1+) present.  Neurological:     Mental Status: She is alert.  Psychiatric:        Mood and Affect: Mood normal.        Behavior: Behavior normal.    I have  personally reviewed the labs and other data, making special note of:   Today's Data  CBG stable   Scheduled Meds:  acyclovir  400 mg Oral BID   atorvastatin  20 mg Oral Daily   bisoprolol  5 mg Oral Daily   budesonide (PULMICORT) nebulizer solution  0.5 mg Nebulization BID   buPROPion  150 mg Oral Daily   chlorhexidine  15 mL Mouth Rinse BID   Chlorhexidine Gluconate Cloth  6 each Topical Daily   enoxaparin (LOVENOX) injection  40 mg Subcutaneous  Q24H   fluticasone  1 spray Each Nare Daily   furosemide  40 mg Oral Daily   guaiFENesin  600 mg Oral QID   hydrALAZINE  50 mg Oral Q8H   hydrocortisone  25 mg Rectal BID   hydrocortisone cream   Topical BID   insulin aspart  0-15 Units Subcutaneous TID WC   insulin aspart  0-5 Units Subcutaneous QHS   insulin aspart  5 Units Subcutaneous TID WC   ipratropium-albuterol  3 mL Nebulization TID   levothyroxine  25 mcg Oral Q0600   loratadine  10 mg Oral Daily   mouth rinse  15 mL Mouth Rinse q12n4p   montelukast  10 mg Oral QHS   mupirocin cream   Topical Daily   nicotine  14 mg Transdermal Daily   olopatadine  1 drop Both Eyes BID   pantoprazole  40 mg Oral BID   predniSONE  20 mg Oral Q breakfast   pregabalin  150 mg Oral QHS   QUEtiapine  50 mg Oral QHS   saccharomyces boulardii  250 mg Oral BID   venlafaxine XR  150 mg Oral Q breakfast   Continuous Infusions:  sodium chloride Stopped (08/17/20 0600)    Principal Problem:   COPD exacerbation (HCC) Active Problems:   Type II diabetes mellitus with manifestations (HCC)   Steroid-induced hyperglycemia   Anxiety   Essential hypertension   Hypothyroidism due to acquired atrophy of thyroid   Depression   OSA on CPAP   LOS: 13 days   How to contact the [facility] Attending or Consulting provider 7A - 7P or covering provider during after hours 7P -7A, for this patient?  Check the care team in [facility] and look for a) attending/consulting TRH provider listed and b) the [facility] team listed Log into [email or web address] and use [place]'s universal password to access. If you do not have the password, please contact the hospital operator. Locate the [facility] provider you are looking for under [facility] and page to a number that you can be directly reached. If you still have difficulty reaching the provider, please page the [facility] (Director on Call) for the Hospitalists listed on amion for assistance.

## 2020-08-21 ENCOUNTER — Ambulatory Visit: Payer: Medicare Other | Admitting: *Deleted

## 2020-08-21 DIAGNOSIS — F419 Anxiety disorder, unspecified: Secondary | ICD-10-CM

## 2020-08-21 DIAGNOSIS — J449 Chronic obstructive pulmonary disease, unspecified: Secondary | ICD-10-CM

## 2020-08-21 DIAGNOSIS — F320 Major depressive disorder, single episode, mild: Secondary | ICD-10-CM

## 2020-08-21 LAB — GLUCOSE, CAPILLARY
Glucose-Capillary: 221 mg/dL — ABNORMAL HIGH (ref 70–99)
Glucose-Capillary: 243 mg/dL — ABNORMAL HIGH (ref 70–99)
Glucose-Capillary: 192 mg/dL — ABNORMAL HIGH (ref 70–99)
Glucose-Capillary: 200 mg/dL — ABNORMAL HIGH (ref 70–99)

## 2020-08-21 MED ORDER — HYOSCYAMINE SULFATE ER 0.375 MG PO TB12
0.3750 mg | ORAL_TABLET | Freq: Once | ORAL | Status: AC
  Administered 2020-08-21: 0.375 mg via ORAL
  Filled 2020-08-21 (×2): qty 1

## 2020-08-21 MED ORDER — ALPRAZOLAM 0.5 MG PO TABS
0.5000 mg | ORAL_TABLET | Freq: Three times a day (TID) | ORAL | Status: DC | PRN
  Administered 2020-08-21 – 2020-08-22 (×2): 0.5 mg via ORAL
  Filled 2020-08-21 (×2): qty 1

## 2020-08-21 NOTE — Progress Notes (Signed)
Physical Therapy Treatment Patient Details Name: Joan [name] MRN: [id number] DOB: Aug 09, 1960 Today's Date: 08/21/2020    History of Present Illness Patient is 60 y.o. female admitted on 08/07/2020 with COPD exacerbation with PMH significant for HTN, hypothyroidism, GERD, COPD, DM, chronic pain, anxiety, depression, PTSD, bipolar d/o, smoker, Lt THR on 01/26/19, s/p laproscopic appendectomy on 03/31/19 and OA.    PT Comments    Pt continues to participate well. She fatigues fairly easily. Cues for concentration on proper breathing technique and focusing on task at hand. She was on 4L O2 today-sats 94% during session. Will continue to follow. Pt stated she is awaiting on decision after a peer-to-peer consult.    Follow Up Recommendations  SNF     Equipment Recommendations  None recommended by PT    Recommendations for Other Services       Precautions / Restrictions Precautions Precautions: Fall Precaution Comments: 3L O2 baseline Restrictions Weight Bearing Restrictions: No    Mobility  Bed Mobility               General bed mobility comments: in recliner    Transfers Overall transfer level: Needs assistance Equipment used: 4-wheeled walker Transfers: Sit to/from Stand Sit to Stand: Supervision         General transfer comment: Supv for safety. cues for proper operation of rollator  Ambulation/Gait Ambulation/Gait assistance: Min guard Gait Distance (Feet): 40 Feet (x2) Assistive device: 4-wheeled walker Gait Pattern/deviations: Step-through pattern;Decreased stride length;Decreased step length - right;Decreased step length - left     General Gait Details: Min guard for safety on today. Very slow gait. Pt had difficulty keeping rollator from veering side to side. Cues for safety, proper use, and to try to roll rollator in straight path. Seated rest break after ~40 feet. Dyspnea 2/4. O2 94% on 4L.   Stairs             Wheelchair Mobility     Modified Rankin (Stroke Patients Only)       Balance Overall balance assessment: Needs assistance;History of Falls         Standing balance support: Bilateral upper extremity supported Standing balance-Leahy Scale: Fair                              Cognition Arousal/Alertness: Awake/alert Behavior During Therapy: WFL for tasks assessed/performed Overall Cognitive Status: Within Functional Limits for tasks assessed                                 General Comments: Pt very conversational. Cues for concentration on breathing and focusing on task.      Exercises      General Comments        Pertinent Vitals/Pain Pain Assessment: No/denies pain    Home Living                      Prior Function            PT Goals (current goals can now be found in the care plan section) Progress towards PT goals: Progressing toward goals    Frequency    Min 3X/week      PT Plan Current plan remains appropriate    Co-evaluation              AM-PAC PT "6 Clicks" Mobility   Outcome Measure  Help  needed turning from your back to your side while in a flat bed without using bedrails?: A Little Help needed moving from lying on your back to sitting on the side of a flat bed without using bedrails?: A Little Help needed moving to and from a bed to a chair (including a wheelchair)?: A Little Help needed standing up from a chair using your arms (e.g., wheelchair or bedside chair)?: A Little Help needed to walk in hospital room?: A Little Help needed climbing 3-5 steps with a railing? : A Lot 6 Click Score: 17    End of Session Equipment Utilized During Treatment: Oxygen Activity Tolerance: Patient limited by fatigue Patient left: in chair;with call bell/phone within reach   PT Visit Diagnosis: Muscle weakness (generalized) (M62.81);Difficulty in walking, not elsewhere classified (R26.2)     Time: 9150-5697 PT Time Calculation  (min) (ACUTE ONLY): 26 min  Charges:  $Gait Training: 23-37 mins                         Doreatha Massed, PT Acute Rehabilitation  Office: [phone] Pager: [phone]

## 2020-08-21 NOTE — Progress Notes (Signed)
Chaplain helped patient complete AD.  Chaplain provided patient two copies plus original.  Copy was given to unit Network engineer for files. Rev. Tamsen Snider Pager [phone]

## 2020-08-21 NOTE — TOC Progression Note (Signed)
Transition of Care [facility]) - Progression Note    Patient Details  Name: Margaree Sandhu MRN: [id number] Date of Birth: May 10, 1960  Transition of Care [facility]) CM/SW Contact  [place] Vanscyoc, Juliann Pulse, RN Phone Number: 08/21/2020, 4:08 PM  Clinical Narrative: Patient chose [facility] for SNF-still awaiting outcome from appeal.      Expected Discharge Plan: Skilled Nursing Facility Barriers to Discharge: Continued Medical Work up  Expected Discharge Plan and Services Expected Discharge Plan: [place]   Discharge Planning Services: CM Consult Post Acute Care Choice: [place] Living arrangements for the past 2 months: Single Family Home Expected Discharge Date: 08/18/20                                     Social Determinants of Health (SDOH) Interventions    Readmission Risk Interventions Readmission Risk Prevention Plan 04/07/2019  Transportation Screening Complete  PCP or Specialist Appt within 3-5 Days Complete  HRI or [place] Complete  Social Work Consult for [place] Planning/Counseling Complete  Palliative Care Screening Not Applicable  Medication Review Press photographer) Complete  Some recent data might be hidden

## 2020-08-21 NOTE — Progress Notes (Signed)
PROGRESS NOTE  Joan [name] GUR:[id number] DOB: 09-09-1960 DOA: 08/07/2020 PCP: [name], MD  Brief History  .60 year old woman PMH COPD, chronic hypoxic respiratory failure on 3 L presented with shortness of breath.  Admitted for COPD exacerbation.  Discharged to SNF 6/24, peer-to-peer conducted and denied.  Patient appealed to insurance company 6/24, await final decision.   * COPD exacerbation ([place]) -- Resolved.  Continue oxygen.  Stop smoking.  Steroid taper continues.  Steroid-induced hyperglycemia -- stable, continue SSI and meal coverage  Type II diabetes mellitus with manifestations ([place]) --SSI, start metformin on discharge, may need second agent or even longacting insulin, will defer longacting insulin for now. Steroid taper complete today. --attention to diabetic teaching, self-CBG checks, diet-education and close outpatient follow-up  Depression --stable, continue Wellbutrin, Effexor, Seroquel  Hypothyroidism due to acquired atrophy of thyroid --continue Synthroid  Essential hypertension --stable, continue bisoprolol, diuretic, hydralazine  Anxiety --stable. --Xanax to BID PRN and wean over next week  OSA on CPAP --continue CPAP QHS  Skin Assessment: I agree with the wound assessment as performed by the wound care RN as outlined below: Pressure Injury 08/15/20 Deep Tissue Pressure Injury - Purple or maroon localized area of discolored intact skin or blood-filled blister due to damage of underlying soft tissue from pressure and/or shear. (Active)  08/15/20   Location:   Location Orientation:   Staging: Deep Tissue Pressure Injury - Purple or maroon localized area of discolored intact skin or blood-filled blister due to damage of underlying soft tissue from pressure and/or shear.  Wound Description (Comments):   Present on Admission: Yes   Disposition Plan:  Discussion: stable for transfer to SNF  Status is: Inpatient  Remains inpatient  appropriate because:Inpatient level of care appropriate due to severity of illness  Dispo: The patient is from: Home              Anticipated d/c is to: SNF              Patient currently is medically stable to d/c.   Difficult to place patient No  DVT prophylaxis: Place TED hose Start: 08/10/20 1232 enoxaparin (LOVENOX) injection 40 mg Start: 08/08/20 1000   Code Status: Full Code Level of care: Med-Surg Family Communication:    [name], MD  [facility] Direct contact: see [email or web address] (further directions at bottom of note if needed) 7PM-7AM contact night coverage as at bottom of note 08/21/2020, 2:53 PM  LOS: 14 days    Interval History/Subjective  CC: f/u SOB  Overall better  Objective   Vitals:  Vitals:   08/21/20 1409 08/21/20 1412  BP: 140/90   Pulse: 92   Resp: 20   Temp: 98.5 F (36.9 C)   SpO2: 98% 96%   Exam: Physical Exam Vitals and nursing note reviewed.  Cardiovascular:     Rate and Rhythm: Normal rate and regular rhythm.  Pulmonary:     Effort: Pulmonary effort is normal. No respiratory distress.     Breath sounds: Normal breath sounds.  Neurological:     Mental Status: She is alert.  Psychiatric:        Mood and Affect: Mood normal.        Behavior: Behavior normal.    I have personally reviewed the labs and other data, making special note of:   Today's Data  CBG stable   Scheduled Meds:  acyclovir  400 mg Oral BID   atorvastatin  20 mg Oral Daily   bisoprolol  5  mg Oral Daily   budesonide (PULMICORT) nebulizer solution  0.5 mg Nebulization BID   buPROPion  150 mg Oral Daily   chlorhexidine  15 mL Mouth Rinse BID   enoxaparin (LOVENOX) injection  40 mg Subcutaneous Q24H   fluticasone  1 spray Each Nare Daily   furosemide  40 mg Oral Daily   guaiFENesin  600 mg Oral QID   hydrALAZINE  50 mg Oral Q8H   hydrocortisone  25 mg Rectal BID   hydrocortisone cream   Topical BID   insulin aspart  0-15 Units Subcutaneous TID WC    insulin aspart  0-5 Units Subcutaneous QHS   insulin aspart  5 Units Subcutaneous TID WC   ipratropium-albuterol  3 mL Nebulization TID   levothyroxine  25 mcg Oral Q0600   loratadine  10 mg Oral Daily   mouth rinse  15 mL Mouth Rinse q12n4p   montelukast  10 mg Oral QHS   mupirocin cream   Topical Daily   nicotine  14 mg Transdermal Daily   olopatadine  1 drop Both Eyes BID   pantoprazole  40 mg Oral BID   predniSONE  20 mg Oral Q breakfast   pregabalin  150 mg Oral QHS   QUEtiapine  50 mg Oral QHS   saccharomyces boulardii  250 mg Oral BID   venlafaxine XR  150 mg Oral Q breakfast   Continuous Infusions:  sodium chloride Stopped (08/17/20 0600)    Principal Problem:   COPD exacerbation (HCC) Active Problems:   Type II diabetes mellitus with manifestations (HCC)   Steroid-induced hyperglycemia   Anxiety   Essential hypertension   Hypothyroidism due to acquired atrophy of thyroid   Depression   OSA on CPAP   LOS: 14 days   How to contact the [facility] Attending or Consulting provider 7A - 7P or covering provider during after hours 7P -7A, for this patient?  Check the care team in [facility] and look for a) attending/consulting TRH provider listed and b) the [facility] team listed Log into [email or web address] and use [place]'s universal password to access. If you do not have the password, please contact the hospital operator. Locate the [facility] provider you are looking for under [facility] and page to a number that you can be directly reached. If you still have difficulty reaching the provider, please page the [facility] (Director on Call) for the Hospitalists listed on amion for assistance.

## 2020-08-21 NOTE — Care Management Important Message (Signed)
Important Message  Patient Details IM Letter given to the Patient. Name: Joan [name] MRN: [id number] Date of Birth: February 25, 1961   Medicare Important Message Given:  Yes     Kerin Salen 08/21/2020, 3:00 PM

## 2020-08-21 NOTE — TOC Progression Note (Signed)
Transition of Care [facility]) - Progression Note    Patient Details  Name: Joan [name] MRN: [id number] Date of Birth: September 11, 1960  Transition of Care [facility] CM/SW Contact  Ross Ludwig, [place] Phone Number: 08/21/2020, 11:13 AM  Clinical Narrative:     CSW spoke to patient's brother [name] [phone] he inquired on any updates for patient.  CSW also informed patient's brother that Juliann Pulse a different [facility] worker is following patient today.  CSW informed him that the [facility] Medicare appeal was started on Friday afternoon and per weekend [facility] worker the appeal is still pending.  CSW explained to him that if patient is still denied, there may be a third level appeal, insurance company will inform patient or [facility] worker if there is.  CSW informed patient's brother that if she is still denied, then the only other option would be private pay or going home with home health.    CSW explained to brother that home health would have to be set up, and they can help assist with other in home care needs.  CSW offered to send a list of private duty care agencies, patient's brother agreed to it, and requested the list be emailed to [email or web address].  CSW Product/process development scientist of private duty care providers.  CSW encouraged brother to help patient apply for Medicaid and if patient goes to SNF have the social worker there help her apply.     Expected Discharge Plan: [place] Barriers to Discharge: Continued Medical Work up  Expected Discharge Plan and Services Expected Discharge Plan: [place]   Discharge Planning Services: CM Consult Post Acute Care Choice: [place] Living arrangements for the past 2 months: Single Family Home Expected Discharge Date: 08/18/20                                     Social Determinants of Health (SDOH) Interventions    Readmission Risk Interventions Readmission Risk Prevention Plan 04/07/2019  Transportation Screening  Complete  PCP or Specialist Appt within 3-5 Days Complete  HRI or [place] Complete  Social Work Consult for [place] Planning/Counseling Complete  Palliative Care Screening Not Applicable  Medication Review Press photographer) Complete  Some recent data might be hidden

## 2020-08-22 DIAGNOSIS — J383 Other diseases of vocal cords: Secondary | ICD-10-CM | POA: Diagnosis not present

## 2020-08-22 DIAGNOSIS — J9611 Chronic respiratory failure with hypoxia: Secondary | ICD-10-CM | POA: Diagnosis not present

## 2020-08-22 DIAGNOSIS — M6281 Muscle weakness (generalized): Secondary | ICD-10-CM | POA: Diagnosis not present

## 2020-08-22 DIAGNOSIS — D508 Other iron deficiency anemias: Secondary | ICD-10-CM | POA: Diagnosis not present

## 2020-08-22 DIAGNOSIS — R251 Tremor, unspecified: Secondary | ICD-10-CM | POA: Diagnosis not present

## 2020-08-22 DIAGNOSIS — Z743 Need for continuous supervision: Secondary | ICD-10-CM | POA: Diagnosis not present

## 2020-08-22 DIAGNOSIS — F0630 Mood disorder due to known physiological condition, unspecified: Secondary | ICD-10-CM | POA: Diagnosis not present

## 2020-08-22 DIAGNOSIS — F32A Depression, unspecified: Secondary | ICD-10-CM | POA: Diagnosis not present

## 2020-08-22 DIAGNOSIS — I509 Heart failure, unspecified: Secondary | ICD-10-CM | POA: Diagnosis not present

## 2020-08-22 DIAGNOSIS — K219 Gastro-esophageal reflux disease without esophagitis: Secondary | ICD-10-CM | POA: Diagnosis not present

## 2020-08-22 DIAGNOSIS — J441 Chronic obstructive pulmonary disease with (acute) exacerbation: Secondary | ICD-10-CM | POA: Diagnosis not present

## 2020-08-22 DIAGNOSIS — J449 Chronic obstructive pulmonary disease, unspecified: Secondary | ICD-10-CM | POA: Diagnosis not present

## 2020-08-22 DIAGNOSIS — S91101A Unspecified open wound of right great toe without damage to nail, initial encounter: Secondary | ICD-10-CM | POA: Diagnosis not present

## 2020-08-22 DIAGNOSIS — R2689 Other abnormalities of gait and mobility: Secondary | ICD-10-CM | POA: Diagnosis not present

## 2020-08-22 DIAGNOSIS — R1311 Dysphagia, oral phase: Secondary | ICD-10-CM | POA: Diagnosis not present

## 2020-08-22 DIAGNOSIS — E119 Type 2 diabetes mellitus without complications: Secondary | ICD-10-CM | POA: Diagnosis not present

## 2020-08-22 DIAGNOSIS — M79604 Pain in right leg: Secondary | ICD-10-CM | POA: Diagnosis not present

## 2020-08-22 DIAGNOSIS — J310 Chronic rhinitis: Secondary | ICD-10-CM | POA: Diagnosis not present

## 2020-08-22 DIAGNOSIS — I10 Essential (primary) hypertension: Secondary | ICD-10-CM | POA: Diagnosis not present

## 2020-08-22 DIAGNOSIS — R911 Solitary pulmonary nodule: Secondary | ICD-10-CM | POA: Diagnosis not present

## 2020-08-22 DIAGNOSIS — Z741 Need for assistance with personal care: Secondary | ICD-10-CM | POA: Diagnosis not present

## 2020-08-22 DIAGNOSIS — B370 Candidal stomatitis: Secondary | ICD-10-CM | POA: Diagnosis not present

## 2020-08-22 DIAGNOSIS — R1013 Epigastric pain: Secondary | ICD-10-CM | POA: Diagnosis not present

## 2020-08-22 DIAGNOSIS — M1990 Unspecified osteoarthritis, unspecified site: Secondary | ICD-10-CM | POA: Diagnosis not present

## 2020-08-22 DIAGNOSIS — F411 Generalized anxiety disorder: Secondary | ICD-10-CM | POA: Diagnosis not present

## 2020-08-22 DIAGNOSIS — M4712 Other spondylosis with myelopathy, cervical region: Secondary | ICD-10-CM | POA: Diagnosis not present

## 2020-08-22 DIAGNOSIS — R5381 Other malaise: Secondary | ICD-10-CM | POA: Diagnosis not present

## 2020-08-22 DIAGNOSIS — T380X5A Adverse effect of glucocorticoids and synthetic analogues, initial encounter: Secondary | ICD-10-CM | POA: Diagnosis not present

## 2020-08-22 DIAGNOSIS — R1310 Dysphagia, unspecified: Secondary | ICD-10-CM | POA: Diagnosis not present

## 2020-08-22 DIAGNOSIS — F320 Major depressive disorder, single episode, mild: Secondary | ICD-10-CM | POA: Diagnosis not present

## 2020-08-22 DIAGNOSIS — Z7401 Bed confinement status: Secondary | ICD-10-CM | POA: Diagnosis not present

## 2020-08-22 DIAGNOSIS — E118 Type 2 diabetes mellitus with unspecified complications: Secondary | ICD-10-CM | POA: Diagnosis not present

## 2020-08-22 DIAGNOSIS — R2241 Localized swelling, mass and lump, right lower limb: Secondary | ICD-10-CM | POA: Diagnosis not present

## 2020-08-22 DIAGNOSIS — R739 Hyperglycemia, unspecified: Secondary | ICD-10-CM | POA: Diagnosis not present

## 2020-08-22 DIAGNOSIS — L0390 Cellulitis, unspecified: Secondary | ICD-10-CM | POA: Diagnosis not present

## 2020-08-22 LAB — GLUCOSE, CAPILLARY
Glucose-Capillary: 275 mg/dL — ABNORMAL HIGH (ref 70–99)
Glucose-Capillary: 229 mg/dL — ABNORMAL HIGH (ref 70–99)

## 2020-08-22 LAB — RESP PANEL BY RT-PCR (FLU A&B, COVID) ARPGX2
Influenza A by PCR: NEGATIVE
Influenza B by PCR: NEGATIVE
SARS Coronavirus 2 by RT PCR: NEGATIVE

## 2020-08-22 MED ORDER — METFORMIN HCL 500 MG PO TABS: 500.0000 mg | ORAL_TABLET | Freq: Two times a day (BID) | ORAL | Status: DC

## 2020-08-22 NOTE — Chronic Care Management (AMB) (Signed)
Chronic Care Management    Clinical Social Work Note  08/22/2020 Name: Joan [name] MRN: [id number] DOB: [birth year]  Joan [name] is a 60 y.o. year old female who is a primary care patient of Joan Salina Real Cons, MD. The CCM team was consulted to assist the patient with chronic disease management and/or care coordination needs related to: [employer]  and [place] and Resources.   Engaged with patient by telephone for follow up visit in response to provider referral for social work chronic care management and care coordination services.   Consent to Services:  The patient was given information about Chronic Care Management services, agreed to services, and gave verbal consent prior to initiation of services.  Please see initial visit note for detailed documentation.   Patient agreed to services and consent obtained.   Assessment: Review of patient past medical history, allergies, medications, and health status, including review of relevant consultants reports was performed today as part of a comprehensive evaluation and provision of chronic care management and care coordination services.     SDOH (Social Determinants of Health) assessments and interventions performed:    Advanced Directives Status: Not addressed in this encounter.  CCM Care Plan  Allergies  Allergen Reactions   Keflex [Cephalexin] Rash and Other (See Comments)    Has tolerated amoxicillin since had keflex   Prednisone Other (See Comments)    "counter reacts"   Shellfish Allergy Shortness Of Breath, Nausea And Vomiting and Other (See Comments)    Stomach cramps   Tetracyclines & Related Anaphylaxis, Swelling and Other (See Comments)    Throat swelling requiring hospitalization   Dilaudid [Hydromorphone Hcl] Other (See Comments)    "Lethargy"   Incruse Ellipta [Umeclidinium Bromide] Nausea Only   Other    Tuberculin Tests Other (See Comments)    False postive     Facility-Administered Encounter Medications as of 08/21/2020  Medication   0.9 %  sodium chloride infusion   acetaminophen (TYLENOL) tablet 650 mg   Or   acetaminophen (TYLENOL) suppository 650 mg   acyclovir (ZOVIRAX) tablet 400 mg   albuterol (PROVENTIL) (2.5 MG/3ML) 0.083% nebulizer solution 2.5 mg   ALPRAZolam (XANAX) tablet 0.5 mg   alum & mag hydroxide-simeth (MAALOX/MYLANTA) 200-200-20 MG/5ML suspension 30 mL   antiseptic oral rinse (BIOTENE) solution 15 mL   atorvastatin (LIPITOR) tablet 20 mg   bisoprolol (ZEBETA) tablet 5 mg   budesonide (PULMICORT) nebulizer solution 0.5 mg   buPROPion (WELLBUTRIN XL) 24 hr tablet 150 mg   Camphor-Menthol-Methyl Sal 3.03-02-08 % PTCH 1 patch   chlorhexidine (PERIDEX) 0.12 % solution 15 mL   enoxaparin (LOVENOX) injection 40 mg   fluticasone (FLONASE) 50 MCG/ACT nasal spray 1 spray   furosemide (LASIX) tablet 40 mg   guaiFENesin (ROBITUSSIN) 100 MG/5ML solution 600 mg   hydrALAZINE (APRESOLINE) injection 5 mg   hydrALAZINE (APRESOLINE) tablet 50 mg   hydrocortisone (ANUSOL-HC) suppository 25 mg   hydrocortisone (ANUSOL-HC) suppository 25 mg   hydrocortisone cream 1 %   insulin aspart (novoLOG) injection 0-15 Units   insulin aspart (novoLOG) injection 0-5 Units   insulin aspart (novoLOG) injection 5 Units   ipratropium-albuterol (DUONEB) 0.5-2.5 (3) MG/3ML nebulizer solution 3 mL   levothyroxine (SYNTHROID) tablet 25 mcg   loratadine (CLARITIN) tablet 10 mg   magnesium hydroxide (MILK OF MAGNESIA) suspension 30 mL   MEDLINE mouth rinse   montelukast (SINGULAIR) tablet 10 mg   mupirocin cream (BACTROBAN) 2 %   nicotine (NICODERM CQ -  dosed in mg/24 hours) patch 14 mg   olopatadine (PATANOL) 0.1 % ophthalmic solution 1 drop   ondansetron (ZOFRAN) tablet 4 mg   Or   ondansetron (ZOFRAN) injection 4 mg   pantoprazole (PROTONIX) EC tablet 40 mg   polyethylene glycol (MIRALAX / GLYCOLAX) packet 17 g   [COMPLETED] predniSONE  (DELTASONE) tablet 20 mg   pregabalin (LYRICA) capsule 150 mg   QUEtiapine (SEROQUEL) tablet 50 mg   saccharomyces boulardii (FLORASTOR) capsule 250 mg   traZODone (DESYREL) tablet 100 mg   venlafaxine XR (EFFEXOR-XR) 24 hr capsule 150 mg   witch hazel-glycerin (TUCKS) pad   Outpatient Encounter Medications as of 08/21/2020  Medication Sig Note   acetaminophen (TYLENOL) 500 MG tablet Take 2 tablets (1,000 mg total) by mouth every 8 (eight) hours as needed for mild pain. (Patient not taking: No sig reported)    acyclovir (ZOVIRAX) 400 MG tablet TAKE 1 TABLET BY MOUTH  TWICE DAILY    albuterol (PROVENTIL) (2.5 MG/3ML) 0.083% nebulizer solution Take 3 mLs (2.5 mg total) by nebulization 2 (two) times daily. And every 6 hours as needed.  DX: J44.9    albuterol (VENTOLIN HFA) 108 (90 Base) MCG/ACT inhaler Inhale 2 puffs into the lungs every 6 (six) hours as needed for wheezing or shortness of breath.    ALPRAZolam (XANAX) 0.5 MG tablet Take 1 tablet (0.5 mg total) by mouth 3 (three) times daily as needed for anxiety.    atenolol (TENORMIN) 25 MG tablet TAKE 1 TABLET BY MOUTH  DAILY    atorvastatin (LIPITOR) 20 MG tablet TAKE 1 TABLET BY MOUTH  DAILY    bisoprolol (ZEBETA) 5 MG tablet Take 1 tablet (5 mg total) by mouth daily.    Budeson-Glycopyrrol-Formoterol (BREZTRI AEROSPHERE) 160-9-4.8 MCG/ACT AERO Inhale 2 puffs into the lungs in the morning and at bedtime. 08/07/2020: Need refills   budesonide-formoterol (SYMBICORT) 160-4.5 MCG/ACT inhaler Inhale 2 puffs into the lungs 2 (two) times daily.    buPROPion (WELLBUTRIN XL) 150 MG 24 hr tablet Take 1 tablet (150 mg total) by mouth daily.    Camphor-Menthol-Methyl Sal 3.03-02-08 % PTCH Apply 1 patch topically daily as needed (pain).    cyclobenzaprine (FLEXERIL) 10 MG tablet TAKE 1 TABLET BY MOUTH AT  BEDTIME    diazepam (VALIUM) 5 MG tablet Take oral route 45 minutes prior to procedure. Repeat PRN 20 minutes.    hydrochlorothiazide (HYDRODIURIL) 25 MG  tablet TAKE 1 TABLET BY MOUTH  DAILY    hydrocortisone (ANUSOL-HC) 25 MG suppository Place 1 suppository (25 mg total) rectally 2 (two) times daily.    Ipratropium-Albuterol (COMBIVENT RESPIMAT) 20-100 MCG/ACT AERS respimat Inhale 1 puff into the lungs every 6 (six) hours as needed for wheezing or shortness of breath.    levothyroxine (SYNTHROID) 25 MCG tablet TAKE 1 TABLET BY MOUTH  DAILY BEFORE BREAKFAST    Liniments (SALONPAS ARTHRITIS PAIN RELIEF EX) Apply 1 patch topically daily as needed (to painful sites- remove as directed).     montelukast (SINGULAIR) 10 MG tablet TAKE 1 TABLET BY MOUTH AT  BEDTIME    mupirocin cream (BACTROBAN) 2 % Apply topically daily.    naproxen (NAPROSYN) 500 MG tablet Take 1 tablet (500 mg total) by mouth 2 (two) times daily as needed for moderate pain.    nystatin-triamcinolone ointment (MYCOLOG) Apply 1 application topically 2 (two) times daily as needed (applied to skin folds/groins/under breast skin irritation rash.).    OXYGEN Inhale 2-3 L/min into the lungs See admin instructions.  Inhale  2-3 L/min of oxygen into the lungs w/CPAP at bedtime and as needed for shortness of breath with "strenuous activity" during the day    pantoprazole (PROTONIX) 40 MG tablet TAKE 1 TABLET BY MOUTH  DAILY    PATADAY 0.2 % SOLN Apply 1 drop to eye daily.    polyethylene glycol (MIRALAX / GLYCOLAX) 17 g packet Take 17 g by mouth daily as needed for mild constipation.    predniSONE (DELTASONE) 10 MG tablet Take 2 tablets (20 mg total) by mouth daily with breakfast for 3 days, THEN 1 tablet (10 mg total) daily with breakfast for 3 days.    pregabalin (LYRICA) 150 MG capsule TAKE 1 CAPSULE BY MOUTH AT  BEDTIME    QUEtiapine (SEROQUEL) 50 MG tablet Take 1 tablet (50 mg total) by mouth at bedtime.    roflumilast (DALIRESP) 500 MCG TABS tablet Take 1 tablet (500 mcg total) by mouth daily.    traZODone (DESYREL) 100 MG tablet TAKE 1 TABLET BY MOUTH AT  BEDTIME    venlafaxine XR  (EFFEXOR-XR) 150 MG 24 hr capsule TAKE 1 CAPSULE BY MOUTH  DAILY WITH BREAKFAST    witch hazel-glycerin (TUCKS) pad Apply topically as needed for itching.     Patient Active Problem List   Diagnosis Date Noted   Depression 08/16/2020   Steroid-induced hyperglycemia 08/16/2020   COPD exacerbation ([place]) 08/07/2020   PTSD (post-traumatic stress disorder) 02/09/2020   Mood disorder in conditions classified elsewhere 02/09/2020   Generalized anxiety disorder 02/09/2020   Type II diabetes mellitus with manifestations ([place]) 10/29/2019   Coarse tremors 10/28/2019   Chronic rhinitis 10/21/2019   Allergic rhinitis 06/16/2019   Cervical spondylosis without myelopathy 12/18/2018   Hypertension    Encounter for general adult medical examination with abnormal findings 11/06/2018   Tobacco abuse 01/15/2018   Hypomagnesemia 12/29/2017   Pulmonary nodule 11/11/2017   Carotid artery disease ([place]) 11/05/2017   OSA on CPAP 08/07/2017   Gastroesophageal reflux disease 06/23/2017   Hypothyroidism due to acquired atrophy of thyroid 04/05/2017   Primary osteoarthritis involving multiple joints 04/05/2017   Chronic constipation 02/10/2017   Iron deficiency anemia due to dietary causes 02/10/2017   Osteoarthritis 02/10/2017   Hyperlipidemia associated with type 2 diabetes mellitus ([place]) 02/10/2017   Chronic respiratory failure with hypoxia ([place]) 12/16/2016   Essential hypertension 11/29/2016   Vocal cord dysfunction    COPD, severe ([place]) 09/24/2016   Anxiety 09/24/2016    Conditions to be addressed/monitored: COPD and Depression; Financial constraints related to limited income, Limited social support, Level of care concerns, Mental Health Concerns , Limited access to caregiver, Cognitive Deficits, Inability to perform ADL's independently, and Lacks knowledge of community resources  Care Plan : [place]  Updates made by Deirdre Peer, LCSW since 08/22/2020 12:00 AM     Problem: Need for  patient-centered plan of care to promote improved mental health and overal quality of life.   Priority: High     Long-Range Goal: Develop self management plan to improve pt's mental health and overall health/quality of life.   Start Date: 05/09/2020  Expected End Date: 09/08/2020  This Visit's Progress: On track  Recent Progress: On track  Priority: High  Note:   Current barriers:   Patient is currently in the hospital- seeking SNF placement. Patient continues to reside with her brother while seeking alternative housing. Pt reports things are fairly calm and "better" with her brother- "my brother has said I can stay  but I'm still looking for my own place".  She is also contacting her insurance to ask if she will be able to keep her [place] [employer] IF she moves to [place]; she is looking at rental property that is is [place] the state [place].  Pt also reports she may want to pursue more frequent/intense counseling; "my brother says I need to go to inpatient care to deal with my past....but I suppressed it and not sure I want to". CSW advised pt that she is likely not in need of inpt care but would encourage her to consider and discuss this further with her counselor and Providers.   Pt also admits to not being very motivated; "still smoking and not eating right". Pt states she does not eat all day and then eats the wrong things. CSW will ask RNCM to follow up as appropriate with the nutrition concerns.  Patient in need of assistance with connecting to community resources for Limited social support, Housing barriers, Mental Health Concerns, Family and relationship dysfunction, and lacks knowledge of community resource: housing. Acknowledges deficits with meeting this unmet need Patient is unable to independently navigate community resource options without care coordination support Patient with limited ability to complete applications for housing due to reported reading  limitations Clinical Goals:  patient will work with SW to address concerns related to mental health support/needs patient will work with Care Guide  to address needs related to housing and other community needs as identified patient will follow up with Psychiatrist and Counselor to further discuss concerns and needs as directed by SW Clinical Interventions:  Pt reports currently hospitalized due to "breathing exacerbation".  "My smoking triggered it".  Pt also reports she is anticipating she will go to a facility at dc "because I can't take care of myself".  Pt shared she is working with hospital RNCM and SW as well as an Printmaker through my insurance".    Collaboration with [name], MD regarding development and update of comprehensive plan of care as evidenced by provider attestation and co-signature Pt reports continued mental health support/sessions with her outpatient therapist and denies any current significant depression/anxiety. Pt denies SI/HI Inter-disciplinary care team collaboration (see longitudinal plan of care) Assessment of needs, barriers , agencies contacted, as well as how impacting  Review various resources, discussed options and provided patient information about Financial constraints related to housing, etc, Limited social support, Housing barriers, Mental Health Concerns , and Family and relationship dysfunction Collaborated with appropriate clinical care team members regarding patient needs Financial constraints related to housing, Limited social support, Housing barriers, Mental Health Concerns , and Family and relationship dysfunction, COPD, Anxiety, and Depression Patient interviewed and appropriate assessments performed Referred patient to community resources care guide team for assistance with housing and other community support/resources as identified Provided mental health counseling with regard to pt concerns/needs (mental health diagnosis or  concern) Discussed plans with patient for ongoing care management follow up and provided patient with direct contact information for care management team Other interventions provided: Motivational Interviewing Solution-Focused Strategies Brief CBT Emotional/Supportive Counseling Psychoeducation and/or Health Education Problem Solving Patient Goals:   - call 911 when I need emergency help - follow-up on any referrals for help I am given and seek housing options on my own in the local paper and community - think ahead to make sure my need does not become an emergency - make a note about what I need to have by the phone or  take with me, like an identification card or social security number have a back-up plan - have a back-up plan - make a list of family or friends that I can call  -follow up with counselor and Psychiatrist at scheduled appointment times to further discuss mental health concerns/needs for more frequent visits as discussed -make Neurologist aware of concerns related to memory issues, recent stuttering,etc -consider seeing a Nutritionist/RD for healthier diet/choices  Follow Up Plan: Appointment scheduled for SW follow up with client by phone on:  08/29/2020      Follow Up Plan: Appointment scheduled for SW follow up with client by phone on:  08/29/20      Joan [name] MSW, [place] Licensed Clinical Social Worker [place] [phone]

## 2020-08-22 NOTE — TOC Transition Note (Addendum)
Transition of Care [facility]) - CM/SW Discharge Note   Patient Details  Name: Joan [name] MRN: [id number] Date of Birth: 10-09-1960  Transition of Care [facility]) CM/SW Contact:  Dessa Phi, RN Phone Number: 08/22/2020, 11:54 AM   Clinical Narrative: [facility]-Appeal [id number] overturned, & approved for SNF-they had approval for [facility],informed [facility] is the SNF chosen & accepted-[employer] Josem Kaufmann [id number] Josem Kaufmann MC#[id number].To correct  SNF to show [facility]-contacted [employer] tel#[phone] [place] [phone]-provider line rep [name] ref#[id number] to change SNF to show correct SNF-[facility].Levt message w/[facility]-await return call for bed availability for d/c today. COVID ordered as rapid. Once covid results back, & [facility] confirmed acceptance bed,rm#,tel# for report will call PTAR-forms @ nsg station.  12:29p-faxed d/c summary updated;going to rm#108,nsg call report tel#[phone] once covid results back. PTAR @ nsg station-will call PTAR once all process completed. 1:29p-covid resouts back neg. PTAR called. Nsg aware. No further CM needs.    Final next level of care: Skilled Nursing Facility Barriers to Discharge: No Barriers Identified   Patient Goals and CMS Choice Patient states their goals for this hospitalization and ongoing recovery are:: go to rehab CMS Medicare.gov Compare Post Acute Care list provided to:: Patient Choice offered to / list presented to : Patient  Discharge Placement PASRR number recieved: 08/20/20            Patient chooses bed at:  ([facility]) Patient to be transferred to facility by: [place] Name of family member notified: patient Patient and family notified of of transfer: 08/22/20  Discharge Plan and Services   Discharge Planning Services: CM Consult Post Acute Care Choice: [place]                               Social Determinants of Health (SDOH) Interventions      Readmission Risk Interventions Readmission Risk Prevention Plan 04/07/2019  Transportation Screening Complete  PCP or Specialist Appt within 3-5 Days Complete  HRI or [place] Complete  Social Work Consult for [place] Planning/Counseling Complete  Palliative Care Screening Not Applicable  Medication Review Press photographer) Complete  Some recent data might be hidden

## 2020-08-22 NOTE — Plan of Care (Signed)
  Problem: Education: Goal: Knowledge of General Education information will improve Description: Including pain rating scale, medication(s)/side effects and non-pharmacologic comfort measures Outcome: Adequate for Discharge   Problem: Health Behavior/Discharge Planning: Goal: Ability to manage health-related needs will improve Outcome: Adequate for Discharge   Problem: Clinical Measurements: Goal: Ability to maintain clinical measurements within normal limits will improve Outcome: Adequate for Discharge Goal: Will remain free from infection Outcome: Adequate for Discharge Goal: Diagnostic test results will improve Outcome: Adequate for Discharge Goal: Respiratory complications will improve Outcome: Adequate for Discharge Goal: Cardiovascular complication will be avoided Outcome: Adequate for Discharge   Problem: Coping: Goal: Level of anxiety will decrease Outcome: Adequate for Discharge   Problem: Safety: Goal: Ability to remain free from injury will improve Outcome: Adequate for Discharge   Problem: Skin Integrity: Goal: Risk for impaired skin integrity will decrease Outcome: Adequate for Discharge

## 2020-08-22 NOTE — Progress Notes (Signed)
PROGRESS NOTE  Joan [name] EBR:[id number] DOB: 04-11-1960 DOA: 08/07/2020 PCP: Joan Koch, MD  Brief History  .60 year old woman PMH COPD, chronic hypoxic respiratory failure on 3 L presented with shortness of breath.  Admitted for COPD exacerbation.  Discharged to SNF 6/24, peer-to-peer conducted and denied.  Patient appealed to insurance company 6/24, was accepted 6/28.   * COPD exacerbation ([place]) -- Resolved.  Continue oxygen.  Stop smoking.  Steroid taper complete.  Steroid-induced hyperglycemia -- stable, continue SSI and meal coverage; steroids complete  Type II diabetes mellitus with manifestations ([place]) --SSI, start metformin on discharge, may need second agent or even longacting insulin, will defer longacting insulin for now. Steroid taper complete today. --attention to diabetic teaching, self-CBG checks, diet-education and close outpatient follow-up  Depression --stable, continue Wellbutrin, Effexor, Seroquel  Hypothyroidism due to acquired atrophy of thyroid --continue Synthroid  Essential hypertension --stable, continue bisoprolol, diuretic, hydralazine  Anxiety --stable. --Xanax TID PRN but would suggest wean over next 2 weeks.   OSA on CPAP --continue CPAP QHS  Skin Assessment: I agree with the wound assessment as performed by the wound care RN as outlined below: Pressure Injury 08/15/20 Deep Tissue Pressure Injury - Purple or maroon localized area of discolored intact skin or blood-filled blister due to damage of underlying soft tissue from pressure and/or shear. (Active)  08/15/20   Location:   Location Orientation:   Staging: Deep Tissue Pressure Injury - Purple or maroon localized area of discolored intact skin or blood-filled blister due to damage of underlying soft tissue from pressure and/or shear.  Wound Description (Comments):   Present on Admission: Yes   Disposition Plan:  Discussion: stable for transfer to SNF  Status is:  Inpatient  Remains inpatient appropriate because:Inpatient level of care appropriate due to severity of illness  Dispo: The patient is from: Home              Anticipated d/c is to: SNF              Patient currently is medically stable to d/c.   Difficult to place patient No  DVT prophylaxis: Place TED hose Start: 08/10/20 1232 enoxaparin (LOVENOX) injection 40 mg Start: 08/08/20 1000   Code Status: Full Code Level of care: Med-Surg Family Communication:    Joan Hodgkins, MD  [facility] Direct contact: see [email or web address] (further directions at bottom of note if needed) 7PM-7AM contact night coverage as at bottom of note 08/22/2020, 12:15 PM  LOS: 15 days    Interval History/Subjective  CC: f/u SOB  Feels ok  Objective   Vitals:  Vitals:   08/22/20 0516 08/22/20 0851  BP: 120/82   Pulse: 90   Resp: 19   Temp: (!) 97.4 F (36.3 C)   SpO2: 98% 92%   Exam: Physical Exam Cardiovascular:     Rate and Rhythm: Normal rate and regular rhythm.  Pulmonary:     Effort: No respiratory distress.     Breath sounds: No rales.     Comments: Mild increased respiratory effort Neurological:     Mental Status: She is alert.  Psychiatric:        Mood and Affect: Mood normal.        Behavior: Behavior normal.    I have personally reviewed the labs and other data, making special note of:   Today's Data  CBG stable   Scheduled Meds:  acyclovir  400 mg Oral BID   atorvastatin  20 mg Oral Daily  bisoprolol  5 mg Oral Daily   budesonide (PULMICORT) nebulizer solution  0.5 mg Nebulization BID   buPROPion  150 mg Oral Daily   chlorhexidine  15 mL Mouth Rinse BID   enoxaparin (LOVENOX) injection  40 mg Subcutaneous Q24H   fluticasone  1 spray Each Nare Daily   furosemide  40 mg Oral Daily   guaiFENesin  600 mg Oral QID   hydrALAZINE  50 mg Oral Q8H   hydrocortisone  25 mg Rectal BID   hydrocortisone cream   Topical BID   insulin aspart  0-15 Units Subcutaneous TID WC    insulin aspart  0-5 Units Subcutaneous QHS   insulin aspart  5 Units Subcutaneous TID WC   ipratropium-albuterol  3 mL Nebulization TID   levothyroxine  25 mcg Oral Q0600   loratadine  10 mg Oral Daily   mouth rinse  15 mL Mouth Rinse q12n4p   montelukast  10 mg Oral QHS   mupirocin cream   Topical Daily   nicotine  14 mg Transdermal Daily   olopatadine  1 drop Both Eyes BID   pantoprazole  40 mg Oral BID   pregabalin  150 mg Oral QHS   QUEtiapine  50 mg Oral QHS   saccharomyces boulardii  250 mg Oral BID   venlafaxine XR  150 mg Oral Q breakfast   Continuous Infusions:  sodium chloride Stopped (08/17/20 0600)    Principal Problem:   COPD exacerbation (HCC) Active Problems:   Type II diabetes mellitus with manifestations (HCC)   Steroid-induced hyperglycemia   Anxiety   Essential hypertension   Hypothyroidism due to acquired atrophy of thyroid   Depression   OSA on CPAP   LOS: 15 days   How to contact the [facility] Attending or Consulting provider 7A - 7P or covering provider during after hours 7P -7A, for this patient?  Check the care team in [facility] and look for a) attending/consulting TRH provider listed and b) the [facility] team listed Log into [email or web address] and use [place] universal password to access. If you do not have the password, please contact the hospital operator. Locate the [facility] provider you are looking for under [facility] and page to a number that you can be directly reached. If you still have difficulty reaching the provider, please page the [facility] (Director on Call) for the Hospitalists listed on amion for assistance.

## 2020-08-22 NOTE — Patient Instructions (Addendum)
Visit Information  PATIENT GOALS:  Goals Addressed               This Visit's Progress     "to work on me" (pt-stated)        Timeframe:  Long-Range Goal Priority:  High Start Date:    05/09/2020                         Expected End Date:   10/25/2020                    Follow Up Date 08/29/2020  -continue seeking housing options and follow up with them asap -ask sister and others to assist with paperwork for housing applications  - begin a notebook of services in my neighborhood or community and resources provided by [employer], RNCM and CSW - call 911 when I need emergency help - follow-up on any referrals for help I am given and seek housing options on my own in the local paper and community - think ahead to make sure my need does not become an emergency - make a note about what I need to have by the phone or take with me, like an identification card or social security number have a back-up plan - have a back-up plan - make a list of family or friends that I can call  -follow up with counselor and Psychiatrist at scheduled appointment times to further discuss mental health concerns/needs for more frequent visits as discussed -make Neurologist aware of concerns related to memory issues, recent stuttering,etc -consider seeing a Nutritionist/RD for healthier diet/choices -      Why is this important?   Knowing how and where to find help for yourself or family in your neighborhood and community is an important skill.  You will want to take some steps to learn how.    Notes:          The patient verbalized understanding of instructions, educational materials, and care plan provided today and declined offer to receive copy of patient instructions, educational materials, and care plan.   Telephone follow up appointment with care management team member scheduled for:08/29/20  Eduard Clos MSW, LCSW Licensed Clinical Social Worker [place] [phone]

## 2020-08-22 NOTE — Progress Notes (Signed)
Attempted to call report to [facility] x1.

## 2020-08-22 NOTE — Progress Notes (Signed)
Report given to Prentiss Bells, RN at [facility].

## 2020-08-23 DIAGNOSIS — S91101A Unspecified open wound of right great toe without damage to nail, initial encounter: Secondary | ICD-10-CM | POA: Diagnosis not present

## 2020-08-23 DIAGNOSIS — E119 Type 2 diabetes mellitus without complications: Secondary | ICD-10-CM | POA: Diagnosis not present

## 2020-08-23 DIAGNOSIS — M6281 Muscle weakness (generalized): Secondary | ICD-10-CM | POA: Diagnosis not present

## 2020-08-23 NOTE — Care Management (Signed)
08/23/20-after d/c note: [facility] received #[id number] E.Given to [place].

## 2020-08-24 ENCOUNTER — Telehealth (INDEPENDENT_AMBULATORY_CARE_PROVIDER_SITE_OTHER): Payer: Medicare Other | Admitting: Psychiatry

## 2020-08-24 ENCOUNTER — Encounter (HOSPITAL_COMMUNITY): Payer: Self-pay | Admitting: Psychiatry

## 2020-08-24 DIAGNOSIS — F411 Generalized anxiety disorder: Secondary | ICD-10-CM | POA: Diagnosis not present

## 2020-08-24 DIAGNOSIS — F0630 Mood disorder due to known physiological condition, unspecified: Secondary | ICD-10-CM | POA: Diagnosis not present

## 2020-08-24 MED ORDER — QUETIAPINE FUMARATE 50 MG PO TABS: 50.0000 mg | ORAL_TABLET | Freq: Every day | ORAL | 2 refills | Status: DC

## 2020-08-24 MED ORDER — BUPROPION HCL ER (XL) 150 MG PO TB24: 150.0000 mg | ORAL_TABLET | Freq: Every day | ORAL | 2 refills | Status: DC

## 2020-08-24 NOTE — Progress Notes (Signed)
[facility] visit  Patient Identification: Joan [name] MRN:  [id number] Date of Evaluation:  08/24/2020 Referral Source:Therapist [name] Chief Complaint:    depression follow up  Visit Diagnosis:    ICD-10-CM   1. Mood disorder in conditions classified elsewhere  F06.30     2. Generalized anxiety disorder  F41.1      Virtual Visit via Telephone Note  I connected with Joan [name] on 08/24/20 at  1:00 PM EDT by telephone and verified that I am speaking with the correct person using two identifiers.  Location: Patient: rehab  Provider: office   I discussed the limitations, risks, security and privacy concerns of performing an evaluation and management service by telephone and the availability of in person appointments. I also discussed with the patient that there may be a patient responsible charge related to this service. The patient expressed understanding and agreed to proceed.       I discussed the assessment and treatment plan with the patient. The patient was provided an opportunity to ask questions and all were answered. The patient agreed with the plan and demonstrated an understanding of the instructions.   The patient was advised to call back or seek an in-person evaluation if the symptoms worsen or if the condition fails to improve as anticipated.  I provided 15  minutes of non-face-to-face time during this encounter.       History of Present Illness: Patient is a 60  years old currently widow Caucasian female initially referred by her therapist for management of mood symptoms, depression possible PTSD and anxiety  Patient currently is in rehab after being admitted for COPD exacerbation \\She  gets short of breath while walking being monitored.  She was given Xanax to 3 times a day as needed says that it has helped anxiety when she was in the hospital stresses mostly related to her medical comorbidity Denies hallucinations She wants to continue  small dose of Xanax as needed since she is admitted in rehab she is taking Seroquel at night and Wellbutrin during the day overall medical comorbidity has added to stress Aggravating factor; difficult childhood, grief, difficult relationship with middle kid, hip replacement medical comorbidity Modifying factor: Siblings  Duration adult life Severity of depression: fair,       Past Psychiatric History: depression, drug use  Previous Psychotropic Medications: Yes   Substance Abuse History in the last 12 months:  No.  Consequences of Substance Abuse: history of extensive drug use uptil 8 years ago and have been in rehab programs  Past Medical History:  Past Medical History:  Diagnosis Date   Acute respiratory failure with hypoxia ([place])    Alcoholism and drug addiction in family    Anxiety    Appendicitis    Arthritis    "knees, hips, lower back" (09/25/2016)   Asthma    Bipolar disorder ([place])    Chronic bronchitis ([place])    "all the time" (09/25/2016)   Chronic leg pain    RLE   Chronic lower back pain    COPD (chronic obstructive pulmonary disease) ([place])    Depression    Diabetes mellitus without complication ([place])    Diabetic neuropathy ([place])    Early cataracts, bilateral    Emphysema of lung ([place])    Generalized anxiety disorder 02/09/2020   GERD (gastroesophageal reflux disease)    Headache    Hepatitis C    "treated back in 2001-2002"   High cholesterol    Hypertension  Hypothyroidism    Incarcerated ventral hernia 04/04/2017   Mood disorder in conditions classified elsewhere 02/09/2020   On home oxygen therapy    "2L; at nighttime" (04/04/2017)   OSA on CPAP    CPAP with Oxygen 2 liters   Paranoid ([place])    Pneumonia    "all the time" (09/25/2016)   Positive PPD    with negative chest x ray   PTSD (post-traumatic stress disorder) 02/09/2020   Tachycardia    patient reports with exertion ; denies any other conditions    Thyroid disease    Wears glasses     Wears partial dentures     Past Surgical History:  Procedure Laterality Date   BACK SURGERY     BIOPSY  11/24/2018   Procedure: BIOPSY;  Surgeon: Thornton Park, MD;  [facility];  Service: Gastroenterology;;   CARDIAC CATHETERIZATION     COLONOSCOPY     COLONOSCOPY WITH PROPOFOL N/A 11/24/2018   Procedure: COLONOSCOPY WITH PROPOFOL;  Surgeon: Thornton Park, MD;  [facility];  Service: Gastroenterology;  Laterality: N/A;   CYST EXCISION     removal of cyst in sinuses   DILATION AND CURETTAGE OF UTERUS     ESOPHAGOGASTRODUODENOSCOPY (EGD) WITH PROPOFOL N/A 11/24/2018   Procedure: ESOPHAGOGASTRODUODENOSCOPY (EGD) WITH PROPOFOL;  Surgeon: Thornton Park, MD;  [facility];  Service: Gastroenterology;  Laterality: N/A;   FOOT SURGERY Bilateral    bone removed from 4th and 5 th toes and put in pin then took pin out   [place]  ~ 2014/2015   "removed mesh & infection"   [place] N/A 04/04/2017   Procedure: LAPAROSCOPIC INCISIONAL HERNIA REPAIR WITH MESH;  Surgeon: Fanny Skates, MD;  Location: [facility];  Service: General;  Laterality: N/A;   LACERATION REPAIR Right    repair wrist artery from laceration from ice skate   LAPAROSCOPIC APPENDECTOMY N/A 03/31/2019   Procedure: APPENDECTOMY LAPAROSCOPIC;  Surgeon: Ralene Ok, MD;  [facility];  Service: General;  Laterality: N/A;   LAPAROSCOPIC APPENDECTOMY N/A 07/09/2019   Procedure: APPENDECTOMY LAPAROSCOPIC;  Surgeon: Ralene Ok, MD;  Location: [facility];  Service: General;  Laterality: N/A;   LAPAROSCOPIC INCISIONAL / UMBILICAL / [place]  04/04/2017   LAPAROSCOPIC INCISIONAL HERNIA REPAIR WITH MESH   LIPOMA EXCISION Right    fattty lipoma in neck   NASAL SEPTUM SURGERY     POSTERIOR LUMBAR FUSION  2015/2016 X 2   "added rods and screws"   SINOSCOPY     TOTAL HIP ARTHROPLASTY Right 02/05/2017   Procedure: RIGHT TOTAL HIP  ARTHROPLASTY;  Surgeon: [name], MD;  [facility];  Service: Orthopedics;  Laterality: Right;   TOTAL HIP ARTHROPLASTY Left 06/18/2017   Procedure: LEFT TOTAL HIP ARTHROPLASTY;  Surgeon: [name], MD;  [facility];  Service: Orthopedics;  Laterality: Left;   TOTAL HIP REVISION Left 01/26/2019   Procedure: TOTAL HIP REVISION;  Surgeon: Paralee Cancel, MD;  [facility];  Service: Orthopedics;  Laterality: Left;  2 hrs   TUBAL LIGATION     UMBILICAL HERNIA REPAIR  ~ 2013   w/mesh   UPPER GI ENDOSCOPY      Family Psychiatric History: FAther : alcohol use  Family History:  Family History  Problem Relation Age of Onset   Diabetes Mother    Hypertension Mother    Hypertension Father    Cancer Father  lung    Social History:   Social History   Socioeconomic History   Marital status: Widowed    Spouse name: Not on file   Number of children: 3   Years of education: Not on file   Highest education level: Not on file  Occupational History   Not on file  Tobacco Use   Smoking status: Every Day    Packs/day: 0.50    Years: 44.00    Pack years: 22.00    Types: Cigarettes   Smokeless tobacco: Never   Tobacco comments:    down to 0.5ppd 06/30/20/jm  Vaping Use   Vaping Use: Never used  Substance and Sexual Activity   Alcohol use: Not Currently   Drug use: Not Currently    Types: "Crack" cocaine    Comment:  "nothing since 01/12/2012"   Sexual activity: Not Currently    Partners: Male  Other Topics Concern   Not on file  Social History Narrative   Not on file   Social Determinants of Health   Financial Resource Strain: High Risk   Difficulty of Paying Living Expenses: Hard  Food Insecurity: Food Insecurity Present   Worried About Running Out of Food in the Last Year: Sometimes true   Ran Out of Food in the Last Year: Sometimes true  Transportation Needs: No Transportation Needs   Lack of Transportation (Medical): No   Lack of  Transportation (Non-Medical): No  Physical Activity: Not on file  Stress: Stress Concern Present   Feeling of Stress : Very much  Social Connections: Not on file      Allergies:   Allergies  Allergen Reactions   Keflex [Cephalexin] Rash and Other (See Comments)    Has tolerated amoxicillin since had keflex   Prednisone Other (See Comments)    "counter reacts"   Shellfish Allergy Shortness Of Breath, Nausea And Vomiting and Other (See Comments)    Stomach cramps   Tetracyclines & Related Anaphylaxis, Swelling and Other (See Comments)    Throat swelling requiring hospitalization   Dilaudid [Hydromorphone Hcl] Other (See Comments)    "Lethargy"   Incruse Ellipta [Umeclidinium Bromide] Nausea Only   Other    Tuberculin Tests Other (See Comments)    False postive    Metabolic Disorder Labs: Lab Results  Component Value Date   HGBA1C 6.8 (H) 08/09/2020   MPG 148 08/09/2020   MPG 151 08/08/2020   No results found for: PROLACTIN Lab Results  Component Value Date   CHOL 141 03/07/2020   TRIG 143.0 03/07/2020   HDL 54.30 03/07/2020   CHOLHDL 3 03/07/2020   VLDL 28.6 03/07/2020   LDLCALC 58 03/07/2020   LDLCALC 62 08/19/2017   Lab Results  Component Value Date   TSH 0.89 10/28/2019    Therapeutic Level Labs: No results found for: LITHIUM No results found for: CBMZ No results found for: VALPROATE  Current Medications: Current Outpatient Medications  Medication Sig Dispense Refill   acyclovir (ZOVIRAX) 400 MG tablet TAKE 1 TABLET BY MOUTH  TWICE DAILY 180 tablet 1   albuterol (PROVENTIL) (2.5 MG/3ML) 0.083% nebulizer solution Take 3 mLs (2.5 mg total) by nebulization 2 (two) times daily. And every 6 hours as needed.  DX: J44.9 360 mL 3   albuterol (VENTOLIN HFA) 108 (90 Base) MCG/ACT inhaler Inhale 2 puffs into the lungs every 6 (six) hours as needed for wheezing or shortness of breath. 24 g 3   ALPRAZolam (XANAX) 0.5 MG tablet Take 1 tablet (0.5 mg  total) by mouth 3  (three) times daily as needed for anxiety. 9 tablet 0   atorvastatin (LIPITOR) 20 MG tablet TAKE 1 TABLET BY MOUTH  DAILY 90 tablet 3   bisoprolol (ZEBETA) 5 MG tablet Take 1 tablet (5 mg total) by mouth daily.     Budeson-Glycopyrrol-Formoterol (BREZTRI AEROSPHERE) 160-9-4.8 MCG/ACT AERO Inhale 2 puffs into the lungs in the morning and at bedtime. 5.9 g 0   buPROPion (WELLBUTRIN XL) 150 MG 24 hr tablet Take 1 tablet (150 mg total) by mouth daily. 30 tablet 2   Camphor-Menthol-Methyl Sal 3.03-02-08 % PTCH Apply 1 patch topically daily as needed (pain).     hydrochlorothiazide (HYDRODIURIL) 25 MG tablet TAKE 1 TABLET BY MOUTH  DAILY 90 tablet 3   hydrocortisone (ANUSOL-HC) 25 MG suppository Place 1 suppository (25 mg total) rectally 2 (two) times daily.     Ipratropium-Albuterol (COMBIVENT RESPIMAT) 20-100 MCG/ACT AERS respimat Inhale 1 puff into the lungs every 6 (six) hours as needed for wheezing or shortness of breath.     levothyroxine (SYNTHROID) 25 MCG tablet TAKE 1 TABLET BY MOUTH  DAILY BEFORE BREAKFAST 90 tablet 3   Liniments (SALONPAS ARTHRITIS PAIN RELIEF EX) Apply 1 patch topically daily as needed (to painful sites- remove as directed).      metFORMIN (GLUCOPHAGE) 500 MG tablet Take 1 tablet (500 mg total) by mouth 2 (two) times daily with a meal.     montelukast (SINGULAIR) 10 MG tablet TAKE 1 TABLET BY MOUTH AT  BEDTIME 90 tablet 3   mupirocin cream (BACTROBAN) 2 % Apply topically daily.     naproxen (NAPROSYN) 500 MG tablet Take 1 tablet (500 mg total) by mouth 2 (two) times daily as needed for moderate pain.     nystatin-triamcinolone ointment (MYCOLOG) Apply 1 application topically 2 (two) times daily as needed (applied to skin folds/groins/under breast skin irritation rash.). 100 g 3   OXYGEN Inhale 2-3 L/min into the lungs See admin instructions. Inhale  2-3 L/min of oxygen into the lungs w/CPAP at bedtime and as needed for shortness of breath with "strenuous activity" during the day      pantoprazole (PROTONIX) 40 MG tablet TAKE 1 TABLET BY MOUTH  DAILY 90 tablet 3   PATADAY 0.2 % SOLN Apply 1 drop to eye daily.     polyethylene glycol (MIRALAX / GLYCOLAX) 17 g packet Take 17 g by mouth daily as needed for mild constipation. 28 packet 0   pregabalin (LYRICA) 150 MG capsule TAKE 1 CAPSULE BY MOUTH AT  BEDTIME 30 capsule 3   QUEtiapine (SEROQUEL) 50 MG tablet Take 1 tablet (50 mg total) by mouth at bedtime. 30 tablet 2   roflumilast (DALIRESP) 500 MCG TABS tablet Take 1 tablet (500 mcg total) by mouth daily. 90 tablet 3   traZODone (DESYREL) 100 MG tablet TAKE 1 TABLET BY MOUTH AT  BEDTIME 90 tablet 3   venlafaxine XR (EFFEXOR-XR) 150 MG 24 hr capsule TAKE 1 CAPSULE BY MOUTH  DAILY WITH BREAKFAST 90 capsule 3   witch hazel-glycerin (TUCKS) pad Apply topically as needed for itching.     No current facility-administered medications for this visit.     Psychiatric Specialty Exam: Review of Systems  Cardiovascular:  Negative for chest pain.  Psychiatric/Behavioral:  Negative for substance abuse and suicidal ideas.    There were no vitals taken for this visit.There is no height or weight on file to calculate BMI.  General Appearance:  Eye Contact:   Speech:  Slow  Volume:  Decreased  Mood:f subdued  Affect:  Congruent  Thought Process:  Goal Directed  Orientation:  Full (Time, Place, and Person)  Thought Content: linear  Suicidal Thoughts:  No  Homicidal Thoughts:  No  Memory:  Immediate;   Fair Recent;   Fair  Judgement:  Fair  Insight:  Shallow  Psychomotor Activity:  Decreased  Concentration:  Concentration: Fair and Attention Span: Fair  Recall:  [employer] of Knowledge:Fair  Language: Fair  Akathisia:  No  Handed:    AIMS (if indicated): no involuntary movements  Assets:  Desire for Improvement Leisure Time Social Support  ADL's: limited due to recent surgery  Cognition: WNL  Sleep:   variable   Screenings: Mini-Mental    Flowsheet Row Clinical  Support from 04/06/2018 in [facility]  Total Score (max 30 points ) 29      PHQ2-9    Flowsheet Row Chronic Care Management from 05/30/2020 in [facility] at [facility] Management from 05/05/2020 in [facility] at [facility] from 05/24/2019 in [facility] at [facility] Patient Outreach Telephone from 12/18/2018 in [facility] Patient Outreach Telephone from 11/18/2018 in [facility]  PHQ-2 Total Score 2 2 2 3 1   PHQ-9 Total Score -- 9 13 -- --      Flowsheet Row Video Visit from 08/24/2020 in [facility] ED to Hosp-Admission (Discharged) from 08/07/2020 in [facility] Video Visit from 04/13/2020 in [facility] No Risk No Risk No Risk       Assessment and Plan: as follows Prior documentation reviewed  Mood disorder unspecified: relavant to multiple medical concerns and past history of using drugs, alcohol We discussed to avoid Xanax for the long-term as of now she is getting as needed while in rehab discussed to work on distraction and continue to work on her medical comorbidities including her COPD and compliance with medications   PTSD; did not endorse much concern related to that considering her above psychosocial stressors  Provided supportive therapy reviewed medication no change in Seroquel will continue Wellbutrin as of now discussed to continue work with rehab to get physically improved  Follow-up in 2 months or earlier if needed [name], MD 6/30/20221:17 PM

## 2020-08-25 DIAGNOSIS — J441 Chronic obstructive pulmonary disease with (acute) exacerbation: Secondary | ICD-10-CM | POA: Diagnosis not present

## 2020-08-25 DIAGNOSIS — K219 Gastro-esophageal reflux disease without esophagitis: Secondary | ICD-10-CM | POA: Diagnosis not present

## 2020-08-25 DIAGNOSIS — I10 Essential (primary) hypertension: Secondary | ICD-10-CM | POA: Diagnosis not present

## 2020-08-25 DIAGNOSIS — E118 Type 2 diabetes mellitus with unspecified complications: Secondary | ICD-10-CM | POA: Diagnosis not present

## 2020-08-29 ENCOUNTER — Telehealth: Payer: Medicare Other

## 2020-08-29 DIAGNOSIS — L0390 Cellulitis, unspecified: Secondary | ICD-10-CM | POA: Diagnosis not present

## 2020-08-29 DIAGNOSIS — J441 Chronic obstructive pulmonary disease with (acute) exacerbation: Secondary | ICD-10-CM | POA: Diagnosis not present

## 2020-08-29 DIAGNOSIS — B370 Candidal stomatitis: Secondary | ICD-10-CM | POA: Diagnosis not present

## 2020-08-30 DIAGNOSIS — E119 Type 2 diabetes mellitus without complications: Secondary | ICD-10-CM | POA: Diagnosis not present

## 2020-08-30 DIAGNOSIS — S91101A Unspecified open wound of right great toe without damage to nail, initial encounter: Secondary | ICD-10-CM | POA: Diagnosis not present

## 2020-08-30 DIAGNOSIS — M6281 Muscle weakness (generalized): Secondary | ICD-10-CM | POA: Diagnosis not present

## 2020-08-30 DIAGNOSIS — R2241 Localized swelling, mass and lump, right lower limb: Secondary | ICD-10-CM | POA: Diagnosis not present

## 2020-09-01 ENCOUNTER — Ambulatory Visit (HOSPITAL_BASED_OUTPATIENT_CLINIC_OR_DEPARTMENT_OTHER): Payer: Medicare Other | Admitting: Family

## 2020-09-01 NOTE — Progress Notes (Deleted)
Office Visit    Patient Name: Joan [name] Date of Encounter: 09/01/2020  PCP:  [name], MD   [place]  Cardiologist:  Quay Burow, MD  Advanced Practice Provider:  No care team member to display Electrophysiologist:  None   Chief Complaint    Joan [name] is a 60 y.o. female with a hx of *** presents today for hospital follow up.    Past Medical History    Past Medical History:  Diagnosis Date   Acute respiratory failure with hypoxia ([place])    Alcoholism and drug addiction in family    Anxiety    Appendicitis    Arthritis    "knees, hips, lower back" (09/25/2016)   Asthma    Bipolar disorder ([place])    Chronic bronchitis ([place])    "all the time" (09/25/2016)   Chronic leg pain    RLE   Chronic lower back pain    COPD (chronic obstructive pulmonary disease) ([place])    Depression    Diabetes mellitus without complication ([facility])    Diabetic neuropathy ([place])    Early cataracts, bilateral    Emphysema of lung ([place])    Generalized anxiety disorder 02/09/2020   GERD (gastroesophageal reflux disease)    Headache    Hepatitis C    "treated back in 2001-2002"   High cholesterol    Hypertension    Hypothyroidism    Incarcerated ventral hernia 04/04/2017   Mood disorder in conditions classified elsewhere 02/09/2020   On home oxygen therapy    "2L; at nighttime" (04/04/2017)   OSA on CPAP    CPAP with Oxygen 2 liters   Paranoid ([place])    Pneumonia    "all the time" (09/25/2016)   Positive PPD    with negative chest x ray   PTSD (post-traumatic stress disorder) 02/09/2020   Tachycardia    patient reports with exertion ; denies any other conditions    Thyroid disease    Wears glasses    Wears partial dentures    Past Surgical History:  Procedure Laterality Date   BACK SURGERY     BIOPSY  11/24/2018   Procedure: BIOPSY;  Surgeon: Thornton Park, MD;  [facility];  Service: Gastroenterology;;   CARDIAC  CATHETERIZATION     COLONOSCOPY     COLONOSCOPY WITH PROPOFOL N/A 11/24/2018   Procedure: COLONOSCOPY WITH PROPOFOL;  Surgeon: Thornton Park, MD;  [facility];  Service: Gastroenterology;  Laterality: N/A;   CYST EXCISION     removal of cyst in sinuses   DILATION AND CURETTAGE OF UTERUS     ESOPHAGOGASTRODUODENOSCOPY (EGD) WITH PROPOFOL N/A 11/24/2018   Procedure: ESOPHAGOGASTRODUODENOSCOPY (EGD) WITH PROPOFOL;  Surgeon: Thornton Park, MD;  [facility];  Service: Gastroenterology;  Laterality: N/A;   FOOT SURGERY Bilateral    bone removed from 4th and 5 th toes and put in pin then took pin out   [place]  ~ 2014/2015   "removed mesh & infection"   [place] N/A 04/04/2017   Procedure: LAPAROSCOPIC INCISIONAL HERNIA REPAIR WITH MESH;  Surgeon: Fanny Skates, MD;  Location: [facility];  Service: General;  Laterality: N/A;   LACERATION REPAIR Right    repair wrist artery from laceration from ice skate   LAPAROSCOPIC APPENDECTOMY N/A 03/31/2019   Procedure: APPENDECTOMY LAPAROSCOPIC;  Surgeon: Ralene Ok, MD;  [facility];  Service: General;  Laterality: N/A;  LAPAROSCOPIC APPENDECTOMY N/A 07/09/2019   Procedure: APPENDECTOMY LAPAROSCOPIC;  Surgeon: Ralene Ok, MD;  Location: [facility];  Service: General;  Laterality: N/A;   LAPAROSCOPIC INCISIONAL / UMBILICAL / [place]  04/04/2017   LAPAROSCOPIC INCISIONAL HERNIA REPAIR WITH MESH   LIPOMA EXCISION Right    fattty lipoma in neck   NASAL SEPTUM SURGERY     POSTERIOR LUMBAR FUSION  2015/2016 X 2   "added rods and screws"   SINOSCOPY     TOTAL HIP ARTHROPLASTY Right 02/05/2017   Procedure: RIGHT TOTAL HIP ARTHROPLASTY;  Surgeon: [name], MD;  [facility];  Service: Orthopedics;  Laterality: Right;   TOTAL HIP ARTHROPLASTY Left 06/18/2017   Procedure: LEFT TOTAL HIP ARTHROPLASTY;  Surgeon: [name], MD;  [facility];   Service: Orthopedics;  Laterality: Left;   TOTAL HIP REVISION Left 01/26/2019   Procedure: TOTAL HIP REVISION;  Surgeon: Paralee Cancel, MD;  [facility];  Service: Orthopedics;  Laterality: Left;  2 hrs   TUBAL LIGATION     UMBILICAL HERNIA REPAIR  ~ 2013   w/mesh   UPPER GI ENDOSCOPY      Allergies  Allergies  Allergen Reactions   Keflex [Cephalexin] Rash and Other (See Comments)    Has tolerated amoxicillin since had keflex   Prednisone Other (See Comments)    "counter reacts"   Shellfish Allergy Shortness Of Breath, Nausea And Vomiting and Other (See Comments)    Stomach cramps   Tetracyclines & Related Anaphylaxis, Swelling and Other (See Comments)    Throat swelling requiring hospitalization   Dilaudid [Hydromorphone Hcl] Other (See Comments)    "Lethargy"   Incruse Ellipta [Umeclidinium Bromide] Nausea Only   Other    Tuberculin Tests Other (See Comments)    False postive    History of Present Illness    Joan [name] is a 60 y.o. female with a hx of HTN, HLD, tobacco use, oxygen dependent COPD, obesity, anxiety, depression, bipolar, PTSD, Dm2, OSA on CPAP, hypothyroidism, GERD last seen while hospitalized.  *** She was hospitalized 08/07/20 with COPD exacerbation and diagnosed with new onset diabetes. Her blood pressure was stable over admission. SHe was discharged on oxygen, metformin, steroid taper.   She presents today for follow up.***   EKGs/Labs/Other Studies Reviewed:   The following studies were reviewed today:  Echo from 08/11/20:    1. Left ventricular ejection fraction, by estimation, is 60 to 65%. The  left ventricle has normal function. The left ventricle has no regional  wall motion abnormalities. Left ventricular diastolic parameters were  normal.   2. Right ventricular systolic function is normal. The right ventricular  size is normal.   3. The mitral valve is grossly normal. No evidence of mitral valve  regurgitation.   4. The  aortic valve is grossly normal. Aortic valve regurgitation is not  visualized. No aortic stenosis is present.     Coronary CTA 01/05/2019:   1. Coronary calcium score of 727. This was 99th percentile for age and sex matched control.   2. Normal coronary origin with right dominance.   3. There is moderate, diffuse calcification in the proximal to mid LAD and LCX. The plaque is heavily calcified which can cause blooming artifact and over-estimation of stenosis.   4.  Will send study for FFRct.   1.  CT FFR analysis didn't show any significant stenosis.   2. Recommend aggressive risk factor modification including high potency statin.  Stress Myoveiw on 08/19/2017:   Nuclear stress EF: 60%. Mild inferoseptal wall hypokinesis There was no ST segment deviation noted during stress. This is a low risk study. No ischemia identified. The study is normal.    EKG:  EKG is  ordered today.  The ekg ordered today demonstrates ***  Recent Labs: 10/28/2019: TSH 0.89 08/07/2020: B Natriuretic Peptide 76.7 08/13/2020: Magnesium 2.2 08/15/2020: BUN 26; Creatinine, Ser 0.88; Hemoglobin 12.6; Platelets 269; Potassium 4.1; Sodium 139 08/16/2020: ALT 26  Recent Lipid Panel    Component Value Date/Time   CHOL 141 03/07/2020 1505   CHOL 134 08/19/2017 0822   TRIG 143.0 03/07/2020 1505   HDL 54.30 03/07/2020 1505   HDL 43 08/19/2017 0822   CHOLHDL 3 03/07/2020 1505   VLDL 28.6 03/07/2020 1505   LDLCALC 58 03/07/2020 1505   LDLCALC 62 08/19/2017 0822   LDLDIRECT 56.0 12/17/2018 1402   Home Medications   No outpatient medications have been marked as taking for the 09/01/20 encounter (Appointment) with Loel Dubonnet, NP.     Review of Systems   ***   All other systems reviewed and are otherwise negative except as noted above.  Physical Exam    VS:  There were no vitals taken for this visit. , BMI There is no height or weight on file to calculate BMI.  Wt Readings from Last 3  Encounters:  08/15/20 243 lb 2.7 oz (110.3 kg)  06/30/20 218 lb 12.8 oz (99.2 kg)  05/04/20 216 lb 12.8 oz (98.3 kg)     GEN: Well nourished, well developed, in no acute distress. HEENT: normal. Neck: Supple, no JVD, carotid bruits, or masses. Cardiac: ***RRR, no murmurs, rubs, or gallops. No clubbing, cyanosis, edema.  ***Radials/PT 2+ and equal bilaterally.  Respiratory:  ***Respirations regular and unlabored, clear to auscultation bilaterally. GI: Soft, nontender, nondistended. MS: No deformity or atrophy. Skin: Warm and dry, no rash. Neuro:  Strength and sensation are intact. Psych: Normal affect.  Assessment & Plan    ***  Disposition: Follow up {follow up:15908} with Dr. Gwenlyn Found or APP.  Signed, Loel Dubonnet, NP 09/01/2020, 1:20 PM [facility]

## 2020-09-04 ENCOUNTER — Inpatient Hospital Stay: Payer: Medicare Other | Admitting: Adult Health

## 2020-09-04 ENCOUNTER — Ambulatory Visit: Payer: Medicare Other | Admitting: Internal Medicine

## 2020-09-04 DIAGNOSIS — I10 Essential (primary) hypertension: Secondary | ICD-10-CM | POA: Diagnosis not present

## 2020-09-04 DIAGNOSIS — K219 Gastro-esophageal reflux disease without esophagitis: Secondary | ICD-10-CM | POA: Diagnosis not present

## 2020-09-04 DIAGNOSIS — J441 Chronic obstructive pulmonary disease with (acute) exacerbation: Secondary | ICD-10-CM | POA: Diagnosis not present

## 2020-09-04 DIAGNOSIS — E118 Type 2 diabetes mellitus with unspecified complications: Secondary | ICD-10-CM | POA: Diagnosis not present

## 2020-09-05 ENCOUNTER — Ambulatory Visit (INDEPENDENT_AMBULATORY_CARE_PROVIDER_SITE_OTHER): Payer: Medicare Other | Admitting: *Deleted

## 2020-09-05 DIAGNOSIS — I10 Essential (primary) hypertension: Secondary | ICD-10-CM | POA: Diagnosis not present

## 2020-09-05 DIAGNOSIS — F320 Major depressive disorder, single episode, mild: Secondary | ICD-10-CM | POA: Diagnosis not present

## 2020-09-05 DIAGNOSIS — J449 Chronic obstructive pulmonary disease, unspecified: Secondary | ICD-10-CM

## 2020-09-05 NOTE — Patient Instructions (Signed)
Visit Information  Joan [name], it was nice talking with you today.   Please read over the attached information   I look forward to talking to you again for an update on Wednesday, October 11, 2020 at 1:00 pm - please be listening out for my call that day.  I will call as close to 1:00 pm as possible; I look forward to hearing about your progress.   Please don't hesitate to contact me if I can be of assistance to you before our next scheduled appointment.   Oneta Rack, RN, BSN, [facility] RN Care Coordination- [place] [phone]: direct office [phone]: mobile    PATIENT GOALS:  Goals Addressed             This Visit's Progress    Lifestyle Change-Hypertension   Not on track    Timeframe:  Long-Range Goal Priority:  Medium Start Date:      05/05/20                       Expected End Date:       11/05/20                Follow Up Date 10/11/20  Once you return home from the SNF/ rehabilitation center:   Continue trying to make lifestyle changes to improve blood pressure; ask questions to understand why this important if you don't understand Learn about high blood pressure, and please resume monitoring/ recording your blood pressure once or twice a week and write it down on paper.  Knowing what your blood pressure typically runs is a good way to know how well your medications are working; this will also help your doctor know if your medications are working or need to be adjusted Continue making small changes to your diet to incorporate lowering your salt intake, reducing your sugar intake, and eating more fresh foods instead of prepared meals which are often high in sodium Continue to take steps to follow through on the resources that were provided to you around housing options Please continue considering stopping smoking- this will help both your breathing and your blood pressure get better   Why is this important?   The changes that you are  asked to make may be hard to do.  This is especially true when the changes are life-long.  Knowing why it is important to you is the first step.  Working on the change with your family or support person helps you not feel alone.  Reward yourself and family or support person when goals are met. This can be an activity you choose like bowling, hiking, biking, swimming or shooting hoops.         Track and Manage My Symptoms-COPD   Not on track    Timeframe:  Long-Range Goal Priority:  High Start Date:      05/05/20                       Expected End Date:     11/05/20                  Follow Up Date 10/11/20   Once you are home from the nursing rehabilitation facility, please try to continue efforts to quit smoking and to get some light activity on days the weather is nice  Follow rescue plan if symptoms flare-up- if you have trouble with your breathing, call your lung doctor  right away Quitting smoking is the most important thing you can do to improve your health I am glad that you are following regularly with the Clinical Social Worker at Dr. Nathanial Millman office and with your regular established psychiatrist/ counselor: please continue to follow up with these health care providers regularly Once you are home from the nursing rehabilitation facility, please make every effort to resume taking your blood pressures at home so we can review these during our future telephone calls   Why is this important?   Tracking your symptoms and other information about your health helps your doctor plan your care.  Write down the symptoms, the time of day, what you were doing and what medicine you are taking.  You will soon learn how to manage your symptoms.             Patient verbalizes understanding of instructions provided today and agrees to view in [place].  Telephone follow up appointment with care management team member scheduled for:  Wednesday, October 11, 2020 at 1:00 pm  The patient has been  provided with contact information for the care management team and has been advised to call with any health related questions or concerns.   Oneta Rack, RN, BSN, [facility] RN Care Coordination- [place] [phone]: direct office [phone]: mobile
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
## 2020-09-06 ENCOUNTER — Ambulatory Visit (HOSPITAL_COMMUNITY): Payer: Medicare Other | Admitting: Licensed Clinical Social Worker

## 2020-09-06 DIAGNOSIS — M6281 Muscle weakness (generalized): Secondary | ICD-10-CM | POA: Diagnosis not present

## 2020-09-06 DIAGNOSIS — E119 Type 2 diabetes mellitus without complications: Secondary | ICD-10-CM | POA: Diagnosis not present

## 2020-09-06 DIAGNOSIS — S91101A Unspecified open wound of right great toe without damage to nail, initial encounter: Secondary | ICD-10-CM | POA: Diagnosis not present

## 2020-09-06 NOTE — Progress Notes (Signed)
Therapist contacted patient by phone and by webex and she did not respond. Session is a no show.

## 2020-09-07 ENCOUNTER — Telehealth: Payer: Self-pay | Admitting: Internal Medicine

## 2020-09-07 DIAGNOSIS — J449 Chronic obstructive pulmonary disease, unspecified: Secondary | ICD-10-CM | POA: Diagnosis not present

## 2020-09-07 DIAGNOSIS — J441 Chronic obstructive pulmonary disease with (acute) exacerbation: Secondary | ICD-10-CM | POA: Diagnosis not present

## 2020-09-07 DIAGNOSIS — J9611 Chronic respiratory failure with hypoxia: Secondary | ICD-10-CM | POA: Diagnosis not present

## 2020-09-07 NOTE — Telephone Encounter (Signed)
Spoke with the patient and she would like to know she would know if she should go back to skilled nursing or have home health come to her house. She mentioned that she has a new condition in her leg and that it could possibly get cut off, she also wanted me to make Dr. Sharlet Salina aware that she is unable to take care of herself. Please advise

## 2020-09-07 NOTE — Telephone Encounter (Signed)
   Patient requesting call to discuss home health/ rehab  Please call

## 2020-09-08 NOTE — Telephone Encounter (Signed)
Should have follow up visit to discuss. I cannot order home health for her since I have not seen her within 90 days. Apt can be with any available provider.

## 2020-09-08 NOTE — Telephone Encounter (Signed)
Called patient. LVM asking her to call our office to schedule a appointment to discuss home health. Office number provided.   If patient calls back please schedule her a visit with any available in office provider. Thanks.

## 2020-09-10 ENCOUNTER — Other Ambulatory Visit: Payer: Self-pay | Admitting: Internal Medicine

## 2020-09-10 ENCOUNTER — Other Ambulatory Visit: Payer: Self-pay | Admitting: Pulmonary Disease

## 2020-09-12 ENCOUNTER — Ambulatory Visit: Payer: Medicare Other | Admitting: Allergy & Immunology

## 2020-09-13 ENCOUNTER — Other Ambulatory Visit: Payer: Self-pay

## 2020-09-13 MED ORDER — ALBUTEROL SULFATE HFA 108 (90 BASE) MCG/ACT IN AERS: 2.0000 | INHALATION_SPRAY | Freq: Four times a day (QID) | RESPIRATORY_TRACT | 6 refills | Status: DC | PRN

## 2020-09-13 NOTE — Telephone Encounter (Signed)
Document from [place] for medication clarification.  New order for generic or alternative albuterol sent per Dr Elsworth Soho.

## 2020-09-18 ENCOUNTER — Ambulatory Visit: Payer: Medicare Other | Admitting: *Deleted

## 2020-09-18 DIAGNOSIS — F419 Anxiety disorder, unspecified: Secondary | ICD-10-CM

## 2020-09-18 DIAGNOSIS — J449 Chronic obstructive pulmonary disease, unspecified: Secondary | ICD-10-CM

## 2020-09-18 DIAGNOSIS — F320 Major depressive disorder, single episode, mild: Secondary | ICD-10-CM

## 2020-09-18 NOTE — Patient Instructions (Signed)
Visit Information  PATIENT GOALS:  Goals Addressed               This Visit's Progress     "to work on me" (pt-stated)        Timeframe:  Long-Range Goal Priority:  High Start Date:    05/09/2020                         Expected End Date:   10/25/2020                    Follow Up Date 09/29/2020  -continue seeking housing options and follow up with them asap -ask sister and others to assist with paperwork for housing applications  - begin a notebook of services in my neighborhood or community and resources provided by [employer], RNCM and CSW - call 911 when I need emergency help - follow-up on any referrals for help I am given and seek housing options on my own in the local paper and community - think ahead to make sure my need does not become an emergency - make a note about what I need to have by the phone or take with me, like an identification card or social security number have a back-up plan - have a back-up plan - make a list of family or friends that I can call  -follow up with counselor and Psychiatrist at scheduled appointment times to further discuss mental health concerns/needs for more frequent visits as discussed -make Neurologist aware of concerns related to memory issues, recent stuttering,etc -consider seeing a Nutritionist/RD for healthier diet/choices -      Why is this important?   Knowing how and where to find help for yourself or family in your neighborhood and community is an important skill.  You will want to take some steps to learn how.    Notes:         The patient verbalized understanding of instructions, educational materials, and care plan provided today and declined offer to receive copy of patient instructions, educational materials, and care plan.   Telephone follow up appointment with care management team member scheduled for:09/29/20  Eduard Clos MSW, LCSW Licensed Clinical Social Worker [place] [phone]

## 2020-09-18 NOTE — Chronic Care Management (AMB) (Signed)
Chronic Care Management    Clinical Social Work Note  09/18/2020 Name: Joan [name] MRN: [id number] DOB: May 27, 1960  Joan [name] is a 60 y.o. year old female who is a primary care patient of Sharlet Salina Real Cons, MD. The CCM team was consulted to assist the patient with chronic disease management and/or care coordination needs related to: [employer]  and Level of Care Concerns.   Engaged with patient by telephone for follow up visit in response to provider referral for social work chronic care management and care coordination services.   Consent to Services:  The patient was given information about Chronic Care Management services, agreed to services, and gave verbal consent prior to initiation of services.  Please see initial visit note for detailed documentation.   Patient agreed to services and consent obtained.   Assessment: Review of patient past medical history, allergies, medications, and health status, including review of relevant consultants reports was performed today as part of a comprehensive evaluation and provision of chronic care management and care coordination services.     SDOH (Social Determinants of Health) assessments and interventions performed:    Advanced Directives Status: Not addressed in this encounter.  CCM Care Plan  Allergies  Allergen Reactions   Keflex [Cephalexin] Rash and Other (See Comments)    Has tolerated amoxicillin since had keflex   Prednisone Other (See Comments)    "counter reacts"   Shellfish Allergy Shortness Of Breath, Nausea And Vomiting and Other (See Comments)    Stomach cramps   Tetracyclines & Related Anaphylaxis, Swelling and Other (See Comments)    Throat swelling requiring hospitalization   Dilaudid [Hydromorphone Hcl] Other (See Comments)    "Lethargy"   Incruse Ellipta [Umeclidinium Bromide] Nausea Only   Other    Tuberculin Tests Other (See Comments)    False postive    Outpatient Encounter  Medications as of 09/18/2020  Medication Sig Note   acyclovir (ZOVIRAX) 400 MG tablet TAKE 1 TABLET BY MOUTH  TWICE DAILY    albuterol (PROVENTIL) (2.5 MG/3ML) 0.083% nebulizer solution Take 3 mLs (2.5 mg total) by nebulization 2 (two) times daily. And every 6 hours as needed.  DX: J44.9    albuterol (VENTOLIN HFA) 108 (90 Base) MCG/ACT inhaler Inhale 2 puffs into the lungs every 6 (six) hours as needed for wheezing or shortness of breath.    ALPRAZolam (XANAX) 0.5 MG tablet Take 1 tablet (0.5 mg total) by mouth 3 (three) times daily as needed for anxiety.    atorvastatin (LIPITOR) 20 MG tablet TAKE 1 TABLET BY MOUTH  DAILY    bisoprolol (ZEBETA) 5 MG tablet Take 1 tablet (5 mg total) by mouth daily.    Budeson-Glycopyrrol-Formoterol (BREZTRI AEROSPHERE) 160-9-4.8 MCG/ACT AERO Inhale 2 puffs into the lungs in the morning and at bedtime. 08/07/2020: Need refills   buPROPion (WELLBUTRIN XL) 150 MG 24 hr tablet Take 1 tablet (150 mg total) by mouth daily.    Camphor-Menthol-Methyl Sal 3.03-02-08 % PTCH Apply 1 patch topically daily as needed (pain).    hydrochlorothiazide (HYDRODIURIL) 25 MG tablet TAKE 1 TABLET BY MOUTH  DAILY    hydrocortisone (ANUSOL-HC) 25 MG suppository Place 1 suppository (25 mg total) rectally 2 (two) times daily.    Ipratropium-Albuterol (COMBIVENT RESPIMAT) 20-100 MCG/ACT AERS respimat Inhale 1 puff into the lungs every 6 (six) hours as needed for wheezing or shortness of breath.    levothyroxine (SYNTHROID) 25 MCG tablet TAKE 1 TABLET BY MOUTH  DAILY BEFORE BREAKFAST  Liniments (SALONPAS ARTHRITIS PAIN RELIEF EX) Apply 1 patch topically daily as needed (to painful sites- remove as directed).     metFORMIN (GLUCOPHAGE) 500 MG tablet Take 1 tablet (500 mg total) by mouth 2 (two) times daily with a meal.    montelukast (SINGULAIR) 10 MG tablet TAKE 1 TABLET BY MOUTH AT  BEDTIME    mupirocin cream (BACTROBAN) 2 % Apply topically daily.    naproxen (NAPROSYN) 500 MG tablet Take 1  tablet (500 mg total) by mouth 2 (two) times daily as needed for moderate pain.    nystatin-triamcinolone ointment (MYCOLOG) Apply 1 application topically 2 (two) times daily as needed (applied to skin folds/groins/under breast skin irritation rash.).    OXYGEN Inhale 2-3 L/min into the lungs See admin instructions. Inhale  2-3 L/min of oxygen into the lungs w/CPAP at bedtime and as needed for shortness of breath with "strenuous activity" during the day    pantoprazole (PROTONIX) 40 MG tablet TAKE 1 TABLET BY MOUTH  DAILY    PATADAY 0.2 % SOLN Apply 1 drop to eye daily.    polyethylene glycol (MIRALAX / GLYCOLAX) 17 g packet Take 17 g by mouth daily as needed for mild constipation.    pregabalin (LYRICA) 150 MG capsule TAKE 1 CAPSULE BY MOUTH AT  BEDTIME    QUEtiapine (SEROQUEL) 50 MG tablet Take 1 tablet (50 mg total) by mouth at bedtime.    roflumilast (DALIRESP) 500 MCG TABS tablet Take 1 tablet (500 mcg total) by mouth daily.    traZODone (DESYREL) 100 MG tablet TAKE 1 TABLET BY MOUTH AT  BEDTIME    venlafaxine XR (EFFEXOR-XR) 150 MG 24 hr capsule TAKE 1 CAPSULE BY MOUTH  DAILY WITH BREAKFAST    witch hazel-glycerin (TUCKS) pad Apply topically as needed for itching.    No facility-administered encounter medications on file as of 09/18/2020.    Patient Active Problem List   Diagnosis Date Noted   Depression 08/16/2020   Steroid-induced hyperglycemia 08/16/2020   COPD exacerbation ([place]) 08/07/2020   PTSD (post-traumatic stress disorder) 02/09/2020   Mood disorder in conditions classified elsewhere 02/09/2020   Generalized anxiety disorder 02/09/2020   Type II diabetes mellitus with manifestations ([place]) 10/29/2019   Coarse tremors 10/28/2019   Chronic rhinitis 10/21/2019   Allergic rhinitis 06/16/2019   Cervical spondylosis without myelopathy 12/18/2018   Hypertension    Encounter for general adult medical examination with abnormal findings 11/06/2018   Tobacco abuse 01/15/2018    Hypomagnesemia 12/29/2017   Pulmonary nodule 11/11/2017   Carotid artery disease ([place]) 11/05/2017   OSA on CPAP 08/07/2017   Gastroesophageal reflux disease 06/23/2017   Hypothyroidism due to acquired atrophy of thyroid 04/05/2017   Primary osteoarthritis involving multiple joints 04/05/2017   Chronic constipation 02/10/2017   Iron deficiency anemia due to dietary causes 02/10/2017   Osteoarthritis 02/10/2017   Hyperlipidemia associated with type 2 diabetes mellitus ([place]) 02/10/2017   Chronic respiratory failure with hypoxia ([place]) 12/16/2016   Essential hypertension 11/29/2016   Vocal cord dysfunction    COPD, severe ([place]) 09/24/2016   Anxiety 09/24/2016    Conditions to be addressed/monitored: COPD and care needs/placement? ; Housing barriers and Level of care concerns  Care Plan : [place]  Updates made by Deirdre Peer, LCSW since 09/18/2020 12:00 AM     Problem: Need for patient-centered plan of care to promote improved mental health and overal quality of life.   Priority: High     Long-Range Goal:  Develop self management plan to improve pt's mental health and overall health/quality of life.   Start Date: 05/09/2020  Expected End Date: 10/24/2020  This Visit's Progress: On track  Recent Progress: On track  Priority: High  Note:   Current barriers:   Patient was recently released from hospital and SNF placement for rehab. She is back at home with her brother and is now seeking housing- maybe [facility] - through her [place] benefits. "We are going to look at an Assisted Living in [place] today.  CSW advised pt there will be some paperwork/coordination to be done through PCP office if she decides to pursue this option. CSW advised pt to reach out if questions or needs arise that CSW can assist with. Patient in need of assistance with connecting to community resources for Limited social support, Housing barriers, Mental Health Concerns, Family and relationship dysfunction,  and lacks knowledge of community resource: housing. Acknowledges deficits with meeting this unmet need Patient is unable to independently navigate community resource options without care coordination support Patient with limited ability to complete applications for housing due to reported reading limitations Clinical Goals:  patient will work with SW to address concerns related to mental health support/needs patient will work with Care Guide  to address needs related to housing and other community needs as identified patient will follow up with Psychiatrist and Counselor to further discuss concerns and needs as directed by SW Clinical Interventions:  seeking housing- maybe [facility]F - through her [place] benefits. "We are going to look at an Assisted Living in [place] today.  CSW advised pt there will be some paperwork/coordination to be done through PCP office if she decides to pursue this option. CSW advised pt to reach out if questions or needs arise that CSW can assist with.  Collaboration with [name], MD regarding development and update of comprehensive plan of care as evidenced by provider attestation and co-signature Pt reports continued mental health support/sessions with her outpatient therapist and denies any current significant depression/anxiety. Pt denies SI/HI Inter-disciplinary care team collaboration (see longitudinal plan of care) Assessment of needs, barriers , agencies contacted, as well as how impacting  Review various resources, discussed options and provided patient information about Financial constraints related to housing, etc, Limited social support, Housing barriers, Mental Health Concerns , and Family and relationship dysfunction Collaborated with appropriate clinical care team members regarding patient needs Financial constraints related to housing, Limited social support, Housing barriers, Mental Health Concerns , and Family and relationship dysfunction, COPD,  Anxiety, and Depression Patient interviewed and appropriate assessments performed Referred patient to community resources care guide team for assistance with housing and other community support/resources as identified Provided mental health counseling with regard to pt concerns/needs (mental health diagnosis or concern) Discussed plans with patient for ongoing care management follow up and provided patient with direct contact information for care management team Other interventions provided: Motivational Interviewing Solution-Focused Strategies Brief CBT Emotional/Supportive Counseling Psychoeducation and/or Health Education Problem Solving Patient Goals:   - call 911 when I need emergency help - follow-up on any referrals for help I am given and seek housing options on my own in the local paper and community - think ahead to make sure my need does not become an emergency - make a note about what I need to have by the phone or take with me, like an identification card or social security number have a back-up plan - have a back-up plan - make a list of  family or friends that I can call  -follow up with counselor and Psychiatrist at scheduled appointment times to further discuss mental health concerns/needs for more frequent visits as discussed -make Neurologist aware of concerns related to memory issues, recent stuttering,etc -consider seeing a Nutritionist/RD for healthier diet/choices  Follow Up Plan: Appointment scheduled for SW follow up with client by phone on:  09/29/2020      Follow Up Plan: Appointment scheduled for SW follow up with client by phone on: 09/29/20      Eduard Clos MSW, [place] Licensed Clinical Social Worker [place] [phone]

## 2020-09-19 ENCOUNTER — Encounter: Payer: Self-pay | Admitting: Internal Medicine

## 2020-09-19 ENCOUNTER — Other Ambulatory Visit: Payer: Self-pay

## 2020-09-19 ENCOUNTER — Ambulatory Visit (INDEPENDENT_AMBULATORY_CARE_PROVIDER_SITE_OTHER): Payer: Medicare Other | Admitting: Internal Medicine

## 2020-09-19 VITALS — BP 136/80 | HR 77 | Temp 98.4°F | Ht 68.0 in | Wt 215.8 lb

## 2020-09-19 DIAGNOSIS — F419 Anxiety disorder, unspecified: Secondary | ICD-10-CM | POA: Diagnosis not present

## 2020-09-19 DIAGNOSIS — J441 Chronic obstructive pulmonary disease with (acute) exacerbation: Secondary | ICD-10-CM | POA: Diagnosis not present

## 2020-09-19 DIAGNOSIS — E118 Type 2 diabetes mellitus with unspecified complications: Secondary | ICD-10-CM | POA: Diagnosis not present

## 2020-09-19 DIAGNOSIS — Z720 Tobacco use: Secondary | ICD-10-CM | POA: Diagnosis not present

## 2020-09-19 MED ORDER — BUSPIRONE HCL 5 MG PO TABS: 2.5000 mg | ORAL_TABLET | Freq: Two times a day (BID) | ORAL | 0 refills | Status: DC | PRN

## 2020-09-19 NOTE — Progress Notes (Signed)
   Subjective:   Patient ID: Joan [name], female    DOB: 08-Jan-1961, 60 y.o.   MRN: [id number]  HPI The patient is a 60 YO female coming in for follow up hospital stay (in for COPD exacerbation, sent to SNF afterwards, then left to go home and has no home health arranged). She is working on getting into ALF through [place] and is not sure how that is going. She needs home health in the meantime to help with bathing. She is requesting refill on xanax which was given to her at SNF and in hospital to help her function better.   PMH, [facility], social history reviewed and updated  Review of Systems  Constitutional:  Positive for activity change and fatigue.  HENT: Negative.    Eyes: Negative.   Respiratory:  Positive for cough and shortness of breath. Negative for chest tightness.   Cardiovascular:  Negative for chest pain, palpitations and leg swelling.  Gastrointestinal:  Negative for abdominal distention, abdominal pain, constipation, diarrhea, nausea and vomiting.  Musculoskeletal:  Positive for arthralgias and myalgias.  Skin: Negative.   Neurological: Negative.   Psychiatric/Behavioral:  Positive for decreased concentration, dysphoric mood and sleep disturbance. The patient is nervous/anxious.    Objective:  Physical Exam Constitutional:      Appearance: She is well-developed.  HENT:     Head: Normocephalic and atraumatic.  Cardiovascular:     Rate and Rhythm: Normal rate and regular rhythm.  Pulmonary:     Effort: Pulmonary effort is normal. No respiratory distress.     Breath sounds: Wheezing and rhonchi present. No rales.     Comments: Lung exam at baseline Abdominal:     General: Bowel sounds are normal. There is no distension.     Palpations: Abdomen is soft.     Tenderness: There is no abdominal tenderness. There is no rebound.  Musculoskeletal:     Cervical back: Normal range of motion.  Skin:    General: Skin is warm and dry.  Neurological:     Mental Status: She is  alert and oriented to person, place, and time.     Coordination: Coordination abnormal.     Comments: Cannot walk long distances, wheelchair today  Psychiatric:     Comments: Difficulty with focusing and concentration and memory during visit, consistent with baseline    Vitals:   09/19/20 1357  BP: 136/80  Pulse: 77  Temp: 98.4 F (36.9 C)  TempSrc: Oral  SpO2: 99%  Weight: 215 lb 12.8 oz (97.9 kg)  Height: '5\' 8"'$  (1.727 m)    This visit occurred during the SARS-CoV-2 public health emergency.  Safety protocols were in place, including screening questions prior to the visit, additional usage of staff PPE, and extensive cleaning of exam room while observing appropriate contact time as indicated for disinfecting solutions.   Assessment & Plan:

## 2020-09-19 NOTE — Patient Instructions (Addendum)
We have given you the letter for the housing.  We have sent in buspar to use for anxiety and energy up to twice a day. Let us know if this helps.  Make sure to schedule a visit with your psychiatrist to see if your medicines are working well.

## 2020-09-22 ENCOUNTER — Encounter: Payer: Self-pay | Admitting: Internal Medicine

## 2020-09-22 NOTE — Assessment & Plan Note (Signed)
She is almost back to baseline but is still weak from recent illness. She is working on ALF for long term assistance with ADLs. Home health is ordered today for nursing and therapy and social work until she can find a living situation.

## 2020-09-22 NOTE — Assessment & Plan Note (Signed)
We discussed recent HgA1c results consistent with diabetes but well controlled and that she is very sensitive to steroids causing hyperglycemia and that she may require insulin temporarily with high dose steroids as she did in the hospital.

## 2020-09-22 NOTE — Assessment & Plan Note (Signed)
She is again smoking and have asked her to quit. She was able to quit for some months several years ago. She is not sure she can make an attempt right now due to stress over her housing situation.

## 2020-09-22 NOTE — Assessment & Plan Note (Signed)
Given that she has history of substance abuse and does not want to be on addictive substances we will rx 1 month buspar to use prn and she is asked to return to her mental health provider for ongoing management of her medications. I have informed her that I will not prescribe xanax for her.

## 2020-09-27 ENCOUNTER — Ambulatory Visit (INDEPENDENT_AMBULATORY_CARE_PROVIDER_SITE_OTHER): Payer: Medicare Other | Admitting: Licensed Clinical Social Worker

## 2020-09-27 DIAGNOSIS — R443 Hallucinations, unspecified: Secondary | ICD-10-CM

## 2020-09-27 DIAGNOSIS — M6281 Muscle weakness (generalized): Secondary | ICD-10-CM | POA: Diagnosis not present

## 2020-09-27 DIAGNOSIS — F17210 Nicotine dependence, cigarettes, uncomplicated: Secondary | ICD-10-CM | POA: Diagnosis not present

## 2020-09-27 DIAGNOSIS — J441 Chronic obstructive pulmonary disease with (acute) exacerbation: Secondary | ICD-10-CM | POA: Diagnosis not present

## 2020-09-27 DIAGNOSIS — F411 Generalized anxiety disorder: Secondary | ICD-10-CM | POA: Diagnosis not present

## 2020-09-27 DIAGNOSIS — F0630 Mood disorder due to known physiological condition, unspecified: Secondary | ICD-10-CM | POA: Diagnosis not present

## 2020-09-27 DIAGNOSIS — Z9981 Dependence on supplemental oxygen: Secondary | ICD-10-CM | POA: Diagnosis not present

## 2020-09-27 DIAGNOSIS — F4310 Post-traumatic stress disorder, unspecified: Secondary | ICD-10-CM | POA: Diagnosis not present

## 2020-09-27 DIAGNOSIS — Z741 Need for assistance with personal care: Secondary | ICD-10-CM | POA: Diagnosis not present

## 2020-09-27 DIAGNOSIS — E119 Type 2 diabetes mellitus without complications: Secondary | ICD-10-CM | POA: Diagnosis not present

## 2020-09-27 NOTE — Progress Notes (Signed)
Virtual Visit via Video Note  I connected with Joan [name] on 09/27/20 at  2:00 PM EDT by a video enabled telemedicine application and verified that I am speaking with the correct person using two identifiers.  Location: Patient: home Provider: home office   I discussed the limitations of evaluation and management by telemedicine and the availability of in person appointments. The patient expressed understanding and agreed to proceed.   I discussed the assessment and treatment plan with the patient. The patient was provided an opportunity to ask questions and all were answered. The patient agreed with the plan and demonstrated an understanding of the instructions.   The patient was advised to call back or seek an in-person evaluation if the symptoms worsen or if the condition fails to improve as anticipated.  I provided 52 minutes of non-face-to-face time during this encounter.  THERAPIST PROGRESS NOTE  Session Time: 2:00 PM to 2:54 PM  Participation Level: Active  Behavioral Response: CasualAlertEuthymic  Type of Therapy: Individual Therapy  Treatment Goals addressed:  PTSD, coping, wants to work on where she is and work through her childhood Interventions: Solution Focused, Strength-based, Supportive, and Other: coping  Summary: Joan [name] is a 60 y.o. female who presents with was in the hospital and rehab. On Xanax and wanted to sleep and sleep.. Know missed a couple of appointments because of that.  Therapist pointed out she was really sick and good to see if she is doing a lot better. Nursing home told her that  Insurance not paying the bill why "kicked out of nursing home" didn't tell her until after that she had to appeal. Didn't like the rehab not getting the medical treatment for leg and feet. Trying to get in to one in [place] trying to get on Medicaid and VA benefits. Have a Education officer, museum coming at 3 PM hope that can get help with filling the form. After  nursing home wants to go to assisted living. If gets [place] and medicaid then can pay what is left. Get up to 1,300.00 through the [place]. Doctor gave her letter doesn't have ability to live at home safely. Come out of nursing home and doesn't have help with bathing. Reviewed the cycle that keeps repeating that time goes on ends up in hospital body took enough.  (Therapist assessed as patient is working on breaking the cycle) this time had pneumonia. Was having chest pain, Need to be at nursing home can't care for herself. Doctor is going to fax paperwork. Has the visiting nurses come in until she get in there. Check on her oxygen send PT, OT, don't have a CNA to bath, OT does that, teach you how to take care of yourself. For example how to take a shower. Gets out of breath. Talked about problems with nursing facility.  Processed these with patient things like roaches food issues, people keeps dropping on her conversations. Was there a week and half tops. Came home 6/28 went into hospital 6/13 was in hospital for a couple of weeks. Was home 6/28. Was covered with insurance for longer. Describes drastic measures to get her out. Working on getting back into one, needs to be there. VA helping with assisted living and was really nice. Two nurses there but $3000.00 try to get benefits to get that place or make available other places for her.. Dr. Sharlet Salina referring to nursing home-PCP.  Therapist provided positive feedback for patient's initiative that we will help her make available assisted living  places for herself  Reviewed symptoms, facilitated expression of thoughts and feelings utilize this as intervention for patient to process feelings around recent events she has been through, stressors and ongoing stressors.  Validated patient on her feelings related to managing services and facilities and the frustrations involved, not feeling getting the care she needs.  Provided positive feedback for plan patient has in place  and also positive she is getting help with getting services in place to help her with this plan to apply for Medicaid, VA assistance, referral back to a nursing home and from their assisted living and using her resources to help her get she needs.  Noted patient's strengths resources and resiliency in managing this process as she has had to provide momentum and had to get these things in motion for them to take place showing initiative and enabling these things to happen.  Therapist provided active listening open questions, supportive interventions. Suicidal/Homicidal: No  Plan: Return again in 4 weeks.2.Work with patient on emotional regulation, stress management, coping  Diagnosis: Axis I: PTSD, hallucinations, GAD, Mood Disorder in conditions classified elsewhere    Axis II: No diagnosis    [name], LCSW 09/27/2020

## 2020-09-28 ENCOUNTER — Telehealth: Payer: Self-pay | Admitting: Internal Medicine

## 2020-09-28 DIAGNOSIS — F17210 Nicotine dependence, cigarettes, uncomplicated: Secondary | ICD-10-CM | POA: Diagnosis not present

## 2020-09-28 DIAGNOSIS — E119 Type 2 diabetes mellitus without complications: Secondary | ICD-10-CM | POA: Diagnosis not present

## 2020-09-28 DIAGNOSIS — Z741 Need for assistance with personal care: Secondary | ICD-10-CM | POA: Diagnosis not present

## 2020-09-28 DIAGNOSIS — Z9981 Dependence on supplemental oxygen: Secondary | ICD-10-CM | POA: Diagnosis not present

## 2020-09-28 DIAGNOSIS — M6281 Muscle weakness (generalized): Secondary | ICD-10-CM | POA: Diagnosis not present

## 2020-09-28 DIAGNOSIS — J441 Chronic obstructive pulmonary disease with (acute) exacerbation: Secondary | ICD-10-CM | POA: Diagnosis not present

## 2020-09-28 NOTE — Telephone Encounter (Signed)
See below

## 2020-09-28 NOTE — Telephone Encounter (Signed)
[place] Name: [facility] Agency Name: [facility] Callback Phone #: [phone] Service Requested: PT Frequency of Visits: 1 week 1, 2 week 2, 1 week 2

## 2020-09-28 NOTE — Telephone Encounter (Signed)
Patient is requesting to have her PT done at [facility] instead of in her current home.. also.says she is trying to get placed there as her living residence  Would like provider or nurse to contact her about this matter [phone]

## 2020-09-29 ENCOUNTER — Ambulatory Visit (INDEPENDENT_AMBULATORY_CARE_PROVIDER_SITE_OTHER): Payer: Medicare Other | Admitting: *Deleted

## 2020-09-29 DIAGNOSIS — J441 Chronic obstructive pulmonary disease with (acute) exacerbation: Secondary | ICD-10-CM

## 2020-09-29 DIAGNOSIS — F17210 Nicotine dependence, cigarettes, uncomplicated: Secondary | ICD-10-CM | POA: Diagnosis not present

## 2020-09-29 DIAGNOSIS — F419 Anxiety disorder, unspecified: Secondary | ICD-10-CM

## 2020-09-29 DIAGNOSIS — F32A Depression, unspecified: Secondary | ICD-10-CM

## 2020-09-29 DIAGNOSIS — Z9981 Dependence on supplemental oxygen: Secondary | ICD-10-CM | POA: Diagnosis not present

## 2020-09-29 DIAGNOSIS — Z741 Need for assistance with personal care: Secondary | ICD-10-CM | POA: Diagnosis not present

## 2020-09-29 DIAGNOSIS — E119 Type 2 diabetes mellitus without complications: Secondary | ICD-10-CM | POA: Diagnosis not present

## 2020-09-29 DIAGNOSIS — M6281 Muscle weakness (generalized): Secondary | ICD-10-CM | POA: Diagnosis not present

## 2020-09-29 NOTE — Patient Instructions (Signed)
Visit Information  PATIENT GOALS:  Goals Addressed               This Visit's Progress     "to work on me" (pt-stated)        Timeframe:  Long-Range Goal Priority:  High Start Date:    05/09/2020                         Expected End Date:   10/25/2020                    Follow Up Date 10/25/2020  -continue seeking housing options and follow up with them asap -continue to work with [place] for housing assistance -ask sister and others to assist with paperwork for housing applications  - begin a notebook of services in my neighborhood or community and resources provided by [employer], RNCM and CSW - call 911 when I need emergency help - follow-up on any referrals for help I am given and seek housing options on my own in the local paper and community - think ahead to make sure my need does not become an emergency - make a note about what I need to have by the phone or take with me, like an identification card or social security number have a back-up plan - have a back-up plan - make a list of family or friends that I can call  -follow up with counselor and Psychiatrist at scheduled appointment times to further discuss mental health concerns/needs for more frequent visits as discussed -make Neurologist aware of concerns related to memory issues, recent stuttering,etc -consider seeing a Nutritionist/RD for healthier diet/choices -      Why is this important?   Knowing how and where to find help for yourself or family in your neighborhood and community is an important skill.  You will want to take some steps to learn how.    Notes:         The patient verbalized understanding of instructions, educational materials, and care plan provided today and declined offer to receive copy of patient instructions, educational materials, and care plan.   Telephone follow up appointment with care management team member scheduled for:10/25/20 Eduard Clos MSW, LCSW Licensed Clinical Social  Worker [place] [phone]

## 2020-09-29 NOTE — Telephone Encounter (Signed)
Do you know if this location does outside PT not just for residents?

## 2020-09-29 NOTE — Chronic Care Management (AMB) (Signed)
Chronic Care Management    Clinical Social Work Note  09/29/2020 Name: Joan [name] MRN: [id number] DOB: 04/29/1960  Joan [name] is a 60 y.o. year old female who is a primary care patient of Sharlet Salina Real Cons, MD. The CCM team was consulted to assist the patient with chronic disease management and/or care coordination needs related to: [employer]  and Level of Care Concerns.   Engaged with patient by telephone for follow up visit in response to provider referral for social work chronic care management and care coordination services.   Consent to Services:  The patient was given information about Chronic Care Management services, agreed to services, and gave verbal consent prior to initiation of services.  Please see initial visit note for detailed documentation.   Patient agreed to services and consent obtained.   Assessment: Review of patient past medical history, allergies, medications, and health status, including review of relevant consultants reports was performed today as part of a comprehensive evaluation and provision of chronic care management and care coordination services.     SDOH (Social Determinants of Health) assessments and interventions performed:    Advanced Directives Status: Not addressed in this encounter.  CCM Care Plan  Allergies  Allergen Reactions   Keflex [Cephalexin] Rash and Other (See Comments)    Has tolerated amoxicillin since had keflex   Prednisone Other (See Comments)    "counter reacts"   Shellfish Allergy Shortness Of Breath, Nausea And Vomiting and Other (See Comments)    Stomach cramps   Tetracyclines & Related Anaphylaxis, Swelling and Other (See Comments)    Throat swelling requiring hospitalization   Dilaudid [Hydromorphone Hcl] Other (See Comments)    "Lethargy"   Incruse Ellipta [Umeclidinium Bromide] Nausea Only   Other    Tuberculin Tests Other (See Comments)    False postive    Outpatient Encounter  Medications as of 09/29/2020  Medication Sig Note   acyclovir (ZOVIRAX) 400 MG tablet TAKE 1 TABLET BY MOUTH  TWICE DAILY    albuterol (PROVENTIL) (2.5 MG/3ML) 0.083% nebulizer solution Take 3 mLs (2.5 mg total) by nebulization 2 (two) times daily. And every 6 hours as needed.  DX: J44.9    albuterol (VENTOLIN HFA) 108 (90 Base) MCG/ACT inhaler Inhale 2 puffs into the lungs every 6 (six) hours as needed for wheezing or shortness of breath.    atenolol (TENORMIN) 25 MG tablet Take 25 mg by mouth daily.    atorvastatin (LIPITOR) 20 MG tablet TAKE 1 TABLET BY MOUTH  DAILY    bisoprolol (ZEBETA) 5 MG tablet Take 1 tablet (5 mg total) by mouth daily.    Budeson-Glycopyrrol-Formoterol (BREZTRI AEROSPHERE) 160-9-4.8 MCG/ACT AERO Inhale 2 puffs into the lungs in the morning and at bedtime. 08/07/2020: Need refills   buPROPion (WELLBUTRIN XL) 150 MG 24 hr tablet Take 1 tablet (150 mg total) by mouth daily.    busPIRone (BUSPAR) 5 MG tablet Take 0.5-1 tablets (2.5-5 mg total) by mouth 2 (two) times daily as needed.    Camphor-Menthol-Methyl Sal 3.03-02-08 % PTCH Apply 1 patch topically daily as needed (pain).    hydrochlorothiazide (HYDRODIURIL) 25 MG tablet TAKE 1 TABLET BY MOUTH  DAILY    hydrocortisone (ANUSOL-HC) 25 MG suppository Place 1 suppository (25 mg total) rectally 2 (two) times daily.    Ipratropium-Albuterol (COMBIVENT RESPIMAT) 20-100 MCG/ACT AERS respimat Inhale 1 puff into the lungs every 6 (six) hours as needed for wheezing or shortness of breath.    levothyroxine (SYNTHROID) 25 MCG  tablet TAKE 1 TABLET BY MOUTH  DAILY BEFORE BREAKFAST    Liniments (SALONPAS ARTHRITIS PAIN RELIEF EX) Apply 1 patch topically daily as needed (to painful sites- remove as directed).     metFORMIN (GLUCOPHAGE) 500 MG tablet Take 1 tablet (500 mg total) by mouth 2 (two) times daily with a meal.    montelukast (SINGULAIR) 10 MG tablet TAKE 1 TABLET BY MOUTH AT  BEDTIME    mupirocin cream (BACTROBAN) 2 % Apply  topically daily.    naproxen (NAPROSYN) 500 MG tablet Take 1 tablet (500 mg total) by mouth 2 (two) times daily as needed for moderate pain.    nystatin-triamcinolone ointment (MYCOLOG) Apply 1 application topically 2 (two) times daily as needed (applied to skin folds/groins/under breast skin irritation rash.).    OXYGEN Inhale 2-3 L/min into the lungs See admin instructions. Inhale  2-3 L/min of oxygen into the lungs w/CPAP at bedtime and as needed for shortness of breath with "strenuous activity" during the day    pantoprazole (PROTONIX) 40 MG tablet TAKE 1 TABLET BY MOUTH  DAILY    PATADAY 0.2 % SOLN Apply 1 drop to eye daily.    polyethylene glycol (MIRALAX / GLYCOLAX) 17 g packet Take 17 g by mouth daily as needed for mild constipation.    pregabalin (LYRICA) 150 MG capsule TAKE 1 CAPSULE BY MOUTH AT  BEDTIME    QUEtiapine (SEROQUEL) 50 MG tablet Take 1 tablet (50 mg total) by mouth at bedtime.    roflumilast (DALIRESP) 500 MCG TABS tablet Take 1 tablet (500 mcg total) by mouth daily.    traZODone (DESYREL) 100 MG tablet TAKE 1 TABLET BY MOUTH AT  BEDTIME    venlafaxine XR (EFFEXOR-XR) 150 MG 24 hr capsule TAKE 1 CAPSULE BY MOUTH  DAILY WITH BREAKFAST    witch hazel-glycerin (TUCKS) pad Apply topically as needed for itching.    No facility-administered encounter medications on file as of 09/29/2020.    Patient Active Problem List   Diagnosis Date Noted   Depression 08/16/2020   Steroid-induced hyperglycemia 08/16/2020   COPD exacerbation ([place]) 08/07/2020   PTSD (post-traumatic stress disorder) 02/09/2020   Mood disorder in conditions classified elsewhere 02/09/2020   Generalized anxiety disorder 02/09/2020   Type II diabetes mellitus with manifestations ([place]) 10/29/2019   Coarse tremors 10/28/2019   Chronic rhinitis 10/21/2019   Allergic rhinitis 06/16/2019   Cervical spondylosis without myelopathy 12/18/2018   Hypertension    Encounter for general adult medical examination with  abnormal findings 11/06/2018   Tobacco abuse 01/15/2018   Hypomagnesemia 12/29/2017   Pulmonary nodule 11/11/2017   Carotid artery disease ([place]) 11/05/2017   OSA on CPAP 08/07/2017   Gastroesophageal reflux disease 06/23/2017   Hypothyroidism due to acquired atrophy of thyroid 04/05/2017   Primary osteoarthritis involving multiple joints 04/05/2017   Chronic constipation 02/10/2017   Iron deficiency anemia due to dietary causes 02/10/2017   Osteoarthritis 02/10/2017   Hyperlipidemia associated with type 2 diabetes mellitus ([place]) 02/10/2017   Chronic respiratory failure with hypoxia ([place]) 12/16/2016   Essential hypertension 11/29/2016   Vocal cord dysfunction    COPD, severe ([place]) 09/24/2016   Anxiety 09/24/2016    Conditions to be addressed/monitored: COPD and housing/level of care needs ; Housing barriers and Level of care concerns  Care Plan : [place]  Updates made by Deirdre Peer, LCSW since 09/29/2020 12:00 AM     Problem: Need for patient-centered plan of care to promote improved mental health  and overal quality of life.   Priority: High     Long-Range Goal: Develop self management plan to improve pt's mental health and overall health/quality of life.   Start Date: 05/09/2020  Expected End Date: 10/25/2020  This Visit's Progress: On track  Recent Progress: On track  Priority: High  Note:   Current barriers:  CSW advised pt there will be some paperwork/coordination to be done through PCP office if she decides to pursue this option. CSW advised pt to reach out if questions or needs arise that CSW can assist with. Patient in need of assistance with connecting to community resources for Limited social support, Housing barriers, Mental Health Concerns, Family and relationship dysfunction, and lacks knowledge of community resource: housing. Acknowledges deficits with meeting this unmet need Patient is unable to independently navigate community resource options  without care coordination support Patient with limited ability to complete applications for housing due to reported reading limitations Clinical Goals:  patient will work with SW to address concerns related to mental health support/needs patient will work with Care Guide  to address needs related to housing and other community needs as identified patient will follow up with Psychiatrist and Counselor to further discuss concerns and needs as directed by SW Clinical Interventions:  Pt continues to work with her [employer] team for possible housing options; ALF.   CSW advised pt there will be some paperwork/coordination to be done through PCP office if she decides to pursue this option. CSW advised pt to reach out if questions or needs arise that CSW can assist with.  Collaboration with [name], MD regarding development and update of comprehensive plan of care as evidenced by provider attestation and co-signature Pt reports continued mental health support/sessions with her outpatient therapist and denies any current significant depression/anxiety. Pt denies SI/HI Inter-disciplinary care team collaboration (see longitudinal plan of care) Assessment of needs, barriers , agencies contacted, as well as how impacting  Review various resources, discussed options and provided patient information about Financial constraints related to housing, etc, Limited social support, Housing barriers, Mental Health Concerns , and Family and relationship dysfunction Collaborated with appropriate clinical care team members regarding patient needs Financial constraints related to housing, Limited social support, Housing barriers, Mental Health Concerns , and Family and relationship dysfunction, COPD, Anxiety, and Depression Patient interviewed and appropriate assessments performed Referred patient to community resources care guide team for assistance with housing and other community support/resources as  identified Provided mental health counseling with regard to pt concerns/needs (mental health diagnosis or concern) Discussed plans with patient for ongoing care management follow up and provided patient with direct contact information for care management team Other interventions provided: Motivational Interviewing Solution-Focused Strategies Brief CBT Emotional/Supportive Counseling Psychoeducation and/or Health Education Problem Solving Patient Goals:   - call 911 when I need emergency help - follow-up on any referrals for help I am given and seek housing options on my own in the local paper and community - think ahead to make sure my need does not become an emergency - make a note about what I need to have by the phone or take with me, like an identification card or social security number have a back-up plan - have a back-up plan - make a list of family or friends that I can call  -follow up with counselor and Psychiatrist at scheduled appointment times to further discuss mental health concerns/needs for more frequent visits as discussed -make Neurologist aware of concerns related to memory  issues, recent stuttering,etc -consider seeing a Nutritionist/RD for healthier diet/choices  Follow Up Plan: Appointment scheduled for SW follow up with client by phone on:  10/25/2020      Follow Up Plan: Appointment scheduled for SW follow up with client by phone on: 10/25/20      Eduard Clos MSW, [place] Licensed Clinical Social Worker [place] [phone]

## 2020-10-02 DIAGNOSIS — Z741 Need for assistance with personal care: Secondary | ICD-10-CM | POA: Diagnosis not present

## 2020-10-02 DIAGNOSIS — E119 Type 2 diabetes mellitus without complications: Secondary | ICD-10-CM | POA: Diagnosis not present

## 2020-10-02 DIAGNOSIS — J441 Chronic obstructive pulmonary disease with (acute) exacerbation: Secondary | ICD-10-CM | POA: Diagnosis not present

## 2020-10-02 DIAGNOSIS — M6281 Muscle weakness (generalized): Secondary | ICD-10-CM | POA: Diagnosis not present

## 2020-10-02 DIAGNOSIS — F17210 Nicotine dependence, cigarettes, uncomplicated: Secondary | ICD-10-CM | POA: Diagnosis not present

## 2020-10-02 DIAGNOSIS — Z9981 Dependence on supplemental oxygen: Secondary | ICD-10-CM | POA: Diagnosis not present

## 2020-10-02 NOTE — Telephone Encounter (Signed)
Called patient. LDVM with the recommendations. Office number was provided.

## 2020-10-02 NOTE — Telephone Encounter (Signed)
That location only does PT for residents.

## 2020-10-02 NOTE — Telephone Encounter (Signed)
Patient calling in again about being able to receive PT at [facility]  Provided patient w/ previous message left by provider  Patient is still req a call back & also still req to have her PT care at this facility  Please callback [phone]

## 2020-10-04 ENCOUNTER — Ambulatory Visit: Payer: Medicare Other | Admitting: Acute Care

## 2020-10-04 ENCOUNTER — Telehealth: Payer: Self-pay | Admitting: Internal Medicine

## 2020-10-04 DIAGNOSIS — I10 Essential (primary) hypertension: Secondary | ICD-10-CM | POA: Diagnosis not present

## 2020-10-04 DIAGNOSIS — Z9981 Dependence on supplemental oxygen: Secondary | ICD-10-CM | POA: Diagnosis not present

## 2020-10-04 DIAGNOSIS — J441 Chronic obstructive pulmonary disease with (acute) exacerbation: Secondary | ICD-10-CM | POA: Diagnosis not present

## 2020-10-04 DIAGNOSIS — M6281 Muscle weakness (generalized): Secondary | ICD-10-CM | POA: Diagnosis not present

## 2020-10-04 DIAGNOSIS — Z741 Need for assistance with personal care: Secondary | ICD-10-CM | POA: Diagnosis not present

## 2020-10-04 DIAGNOSIS — E119 Type 2 diabetes mellitus without complications: Secondary | ICD-10-CM | POA: Diagnosis not present

## 2020-10-04 DIAGNOSIS — R519 Headache, unspecified: Secondary | ICD-10-CM | POA: Diagnosis not present

## 2020-10-04 DIAGNOSIS — F17210 Nicotine dependence, cigarettes, uncomplicated: Secondary | ICD-10-CM | POA: Diagnosis not present

## 2020-10-04 NOTE — Telephone Encounter (Signed)
[place] Name: [facility] Agency Name: Doristine Johns Phone #: [phone] Service Requested: OT eval Frequency of Visits: eval  Patient missed appointment today. [facility] to see patient again 8/12

## 2020-10-04 NOTE — Telephone Encounter (Signed)
   Please return call to patient She is requesting FL2 completion for [employer]

## 2020-10-04 NOTE — Telephone Encounter (Signed)
Spoke with the patient and she is aware that [facility] is going to sign the FL2. I will call patient back once it is completed and faxed. Patient was very appreciative for the call.

## 2020-10-04 NOTE — Telephone Encounter (Signed)
See below

## 2020-10-05 ENCOUNTER — Telehealth: Payer: Self-pay | Admitting: Internal Medicine

## 2020-10-05 NOTE — Telephone Encounter (Signed)
Okay for verbals °

## 2020-10-05 NOTE — Telephone Encounter (Signed)
Noted  

## 2020-10-05 NOTE — Telephone Encounter (Signed)
[place] Name: [facility] Agency Name: Doristine Johns Phone #: [phone] Service Requested: OT Evaluation

## 2020-10-05 NOTE — Telephone Encounter (Signed)
See below

## 2020-10-05 NOTE — Telephone Encounter (Signed)
Called [place]. LVM with verbal orders. Office number was provided.

## 2020-10-06 ENCOUNTER — Telehealth: Payer: Self-pay | Admitting: Pulmonary Disease

## 2020-10-06 DIAGNOSIS — M6281 Muscle weakness (generalized): Secondary | ICD-10-CM | POA: Diagnosis not present

## 2020-10-06 DIAGNOSIS — Z741 Need for assistance with personal care: Secondary | ICD-10-CM | POA: Diagnosis not present

## 2020-10-06 DIAGNOSIS — F17210 Nicotine dependence, cigarettes, uncomplicated: Secondary | ICD-10-CM | POA: Diagnosis not present

## 2020-10-06 DIAGNOSIS — E119 Type 2 diabetes mellitus without complications: Secondary | ICD-10-CM | POA: Diagnosis not present

## 2020-10-06 DIAGNOSIS — Z9981 Dependence on supplemental oxygen: Secondary | ICD-10-CM | POA: Diagnosis not present

## 2020-10-06 DIAGNOSIS — J441 Chronic obstructive pulmonary disease with (acute) exacerbation: Secondary | ICD-10-CM | POA: Diagnosis not present

## 2020-10-06 NOTE — Telephone Encounter (Signed)
LMTCB

## 2020-10-06 NOTE — Telephone Encounter (Signed)
Spoke with the pt  She states that she just got out of the hospital and feels her breathing hs not improved much  She has minimal wheezing and dry cough  No f/c/s, aches, chest pain or tightness Neb helps some I offered her appt for 10/09/20 and she refused, states the only providers she will see are Judson Roch or Dr Elsworth Soho  She is already scheduled for 10/18/20 with Judson Roch and wants to just keep this appt  She is aware to seek emergent care sooner if needed

## 2020-10-06 NOTE — Telephone Encounter (Signed)
Joan [name] stated that the pt is wheezing and she has been using her medication such as nebulizer and inhalers. Pls regard; [phone]. She was calling to give an update to the nurse and doctor.

## 2020-10-08 DIAGNOSIS — J449 Chronic obstructive pulmonary disease, unspecified: Secondary | ICD-10-CM | POA: Diagnosis not present

## 2020-10-08 DIAGNOSIS — J441 Chronic obstructive pulmonary disease with (acute) exacerbation: Secondary | ICD-10-CM | POA: Diagnosis not present

## 2020-10-08 DIAGNOSIS — J9611 Chronic respiratory failure with hypoxia: Secondary | ICD-10-CM | POA: Diagnosis not present

## 2020-10-09 ENCOUNTER — Ambulatory Visit: Payer: Medicare Other | Admitting: Podiatry

## 2020-10-09 DIAGNOSIS — M6281 Muscle weakness (generalized): Secondary | ICD-10-CM | POA: Diagnosis not present

## 2020-10-09 DIAGNOSIS — Z741 Need for assistance with personal care: Secondary | ICD-10-CM | POA: Diagnosis not present

## 2020-10-09 DIAGNOSIS — J441 Chronic obstructive pulmonary disease with (acute) exacerbation: Secondary | ICD-10-CM | POA: Diagnosis not present

## 2020-10-09 DIAGNOSIS — Z9981 Dependence on supplemental oxygen: Secondary | ICD-10-CM | POA: Diagnosis not present

## 2020-10-09 DIAGNOSIS — E119 Type 2 diabetes mellitus without complications: Secondary | ICD-10-CM | POA: Diagnosis not present

## 2020-10-09 DIAGNOSIS — F17210 Nicotine dependence, cigarettes, uncomplicated: Secondary | ICD-10-CM | POA: Diagnosis not present

## 2020-10-10 ENCOUNTER — Ambulatory Visit: Payer: Medicare Other | Admitting: *Deleted

## 2020-10-10 DIAGNOSIS — F17210 Nicotine dependence, cigarettes, uncomplicated: Secondary | ICD-10-CM | POA: Diagnosis not present

## 2020-10-10 DIAGNOSIS — Z741 Need for assistance with personal care: Secondary | ICD-10-CM | POA: Diagnosis not present

## 2020-10-10 DIAGNOSIS — J441 Chronic obstructive pulmonary disease with (acute) exacerbation: Secondary | ICD-10-CM | POA: Diagnosis not present

## 2020-10-10 DIAGNOSIS — M6281 Muscle weakness (generalized): Secondary | ICD-10-CM | POA: Diagnosis not present

## 2020-10-10 DIAGNOSIS — Z9981 Dependence on supplemental oxygen: Secondary | ICD-10-CM | POA: Diagnosis not present

## 2020-10-10 DIAGNOSIS — E119 Type 2 diabetes mellitus without complications: Secondary | ICD-10-CM | POA: Diagnosis not present

## 2020-10-11 ENCOUNTER — Ambulatory Visit: Payer: Medicare Other | Admitting: *Deleted

## 2020-10-11 ENCOUNTER — Telehealth: Payer: Self-pay | Admitting: *Deleted

## 2020-10-11 DIAGNOSIS — Z9981 Dependence on supplemental oxygen: Secondary | ICD-10-CM | POA: Diagnosis not present

## 2020-10-11 DIAGNOSIS — I10 Essential (primary) hypertension: Secondary | ICD-10-CM

## 2020-10-11 DIAGNOSIS — J441 Chronic obstructive pulmonary disease with (acute) exacerbation: Secondary | ICD-10-CM | POA: Diagnosis not present

## 2020-10-11 DIAGNOSIS — M6281 Muscle weakness (generalized): Secondary | ICD-10-CM | POA: Diagnosis not present

## 2020-10-11 DIAGNOSIS — E119 Type 2 diabetes mellitus without complications: Secondary | ICD-10-CM | POA: Diagnosis not present

## 2020-10-11 DIAGNOSIS — F17210 Nicotine dependence, cigarettes, uncomplicated: Secondary | ICD-10-CM | POA: Diagnosis not present

## 2020-10-11 DIAGNOSIS — Z741 Need for assistance with personal care: Secondary | ICD-10-CM | POA: Diagnosis not present

## 2020-10-11 DIAGNOSIS — J449 Chronic obstructive pulmonary disease, unspecified: Secondary | ICD-10-CM

## 2020-10-11 NOTE — Patient Instructions (Signed)
Visit Information  PATIENT GOALS:  Goals Addressed               This Visit's Progress     "to work on me" (pt-stated)        Timeframe:  Long-Range Goal Priority:  High Start Date:    05/09/2020                         Expected End Date:   11/24/2020                    Follow Up Date 10/25/2020  -continue with attempt for rehab placement -continue seeking housing options and follow up with them asap -continue to work with [place] for housing assistance -ask sister and others to assist with paperwork for housing applications  - begin a notebook of services in my neighborhood or community and resources provided by [employer], RNCM and CSW - call 911 when I need emergency help - follow-up on any referrals for help I am given and seek housing options on my own in the local paper and community - think ahead to make sure my need does not become an emergency - make a note about what I need to have by the phone or take with me, like an identification card or social security number have a back-up plan - have a back-up plan - make a list of family or friends that I can call  -follow up with counselor and Psychiatrist at scheduled appointment times to further discuss mental health concerns/needs for more frequent visits as discussed -make Neurologist aware of concerns related to memory issues, recent stuttering,etc -consider seeing a Nutritionist/RD for healthier diet/choices -      Why is this important?   Knowing how and where to find help for yourself or family in your neighborhood and community is an important skill.  You will want to take some steps to learn how.    Notes:         The patient verbalized understanding of instructions, educational materials, and care plan provided today and declined offer to receive copy of patient instructions, educational materials, and care plan.   Telephone follow up appointment with care management team member scheduled for:10/25/20  Eduard Clos MSW, LCSW Licensed Clinical Social Worker [place] [phone]

## 2020-10-11 NOTE — Patient Instructions (Signed)
Visit Information  Joan [name], it was nice talking with you today.   As you requested today, I have asked the pharmacist to call you about your patient assistance program for your inhalers- please listen out for a call from the pharmacist   I look forward to talking to you again for an update on Tuesday November 14, 2020 at 2:00 pm - please be listening out for my call that day.  I will call as close to 2:00 pm as possible.   If you need to cancel or re-schedule our telephone visit, please call [phone] and one of our care guides will be happy to assist you.   I look forward to hearing about your progress.   Please don't hesitate to contact me if I can be of assistance to you before our next scheduled telephone appointment.   Oneta Rack, RN, BSN, [facility] RN Care Coordination- [place] [phone]: direct office [phone]: mobile   PATIENT GOALS:  Goals Addressed             This Visit's Progress    Lifestyle Change-Hypertension   On track    Timeframe:  Long-Range Goal Priority:  Medium Start Date:      05/05/20                       Expected End Date:       11/05/20                Follow Up Date 11/14/20  Continue trying to make lifestyle changes to improve blood pressure; ask questions to understand why this important if you don't understand Learn about high blood pressure, and please resume monitoring/ recording your blood pressure once or twice a week and write it down on paper.  Knowing what your blood pressure typically runs is a good way to know how well your medications are working; this will also help your doctor know if your medications are working or need to be adjusted Continue making small changes to your diet to incorporate lowering your salt intake, reducing your sugar intake, and eating more fresh foods instead of prepared meals which are often high in sodium Continue to take steps to follow through on the resources that  were provided to you around housing options-- I  am glad to hear that you are working with the [place] and have some options available to you Please continue considering stopping smoking- this will help both your breathing and your blood pressure get better   Why is this important?   The changes that you are asked to make may be hard to do.  This is especially true when the changes are life-long.  Knowing why it is important to you is the first step.  Working on the change with your family or support person helps you not feel alone.  Reward yourself and family or support person when goals are met. This can be an activity you choose like bowling, hiking, biking, swimming or shooting hoops.        Track and Manage My Symptoms-COPD   On track    Timeframe:  Long-Range Goal Priority:  High Start Date:      05/05/20                       Expected End Date:     11/05/20  Follow Up Date 11/14/20   Please try to continue efforts to reduce the number of cigarettes you smoke and to get some light activity on days the weather is nice  Follow rescue plan if symptoms flare-up- if you have trouble with your breathing, call your lung doctor right away I have asked the Pharmacist to call you to discuss your inhaler prescriptions- please listen out for a call from the pharmacist Quitting smoking is the most important thing you can do to improve your health I am glad that you are following regularly with the Clinical Social Worker at Dr. Nathanial Millman office and with your regular established psychiatrist/ counselor: please continue to follow up with these health care providers regularly Once you are home from the nursing rehabilitation facility, please make every effort to resume taking your blood pressures at home so we can review these during our future telephone calls   Why is this important?   Tracking your symptoms and other information about your health helps your doctor plan your care.  Write  down the symptoms, the time of day, what you were doing and what medicine you are taking.  You will soon learn how to manage your symptoms.            Patient verbalizes understanding of instructions provided today and agrees to view in [place].  Telephone follow up appointment with care management team member scheduled for:  Tuesday November 14, 2020 at 2:00 pm  The patient has been provided with contact information for the care management team and has been advised to call with any health related questions or concerns.   Oneta Rack, RN, BSN, [facility] RN Care Coordination- [place] [phone]: direct office [phone]: mobile
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
## 2020-10-11 NOTE — Chronic Care Management (AMB) (Signed)
Chronic Care Management    Clinical Social Work Note  10/11/2020 Name: Joan [name] MRN: [id number] DOB: 12-09-1960  Joan [name] is a 60 y.o. year old female who is a primary care patient of [name], MD. The CCM team was consulted to assist the patient with chronic disease management and/or care coordination needs related to: Level of Care Concerns.   Engaged with patient by telephone for follow up visit in response to provider referral for social work chronic care management and care coordination services.   Consent to Services:  The patient was given information about Chronic Care Management services, agreed to services, and gave verbal consent prior to initiation of services.  Please see initial visit note for detailed documentation.   Patient agreed to services and consent obtained.   Assessment: Review of patient past medical history, allergies, medications, and health status, including review of relevant consultants reports was performed today as part of a comprehensive evaluation and provision of chronic care management and care coordination services.     SDOH (Social Determinants of Health) assessments and interventions performed:    Advanced Directives Status: Not addressed in this encounter.  CCM Care Plan  Allergies  Allergen Reactions   Keflex [Cephalexin] Rash and Other (See Comments)    Has tolerated amoxicillin since had keflex   Prednisone Other (See Comments)    "counter reacts"   Shellfish Allergy Shortness Of Breath, Nausea And Vomiting and Other (See Comments)    Stomach cramps   Tetracyclines & Related Anaphylaxis, Swelling and Other (See Comments)    Throat swelling requiring hospitalization   Dilaudid [Hydromorphone Hcl] Other (See Comments)    "Lethargy"   Incruse Ellipta [Umeclidinium Bromide] Nausea Only   Other    Tuberculin Tests Other (See Comments)    False postive    Outpatient Encounter Medications as of 10/10/2020   Medication Sig Note   acyclovir (ZOVIRAX) 400 MG tablet TAKE 1 TABLET BY MOUTH  TWICE DAILY    albuterol (PROVENTIL) (2.5 MG/3ML) 0.083% nebulizer solution Take 3 mLs (2.5 mg total) by nebulization 2 (two) times daily. And every 6 hours as needed.  DX: J44.9    albuterol (VENTOLIN HFA) 108 (90 Base) MCG/ACT inhaler Inhale 2 puffs into the lungs every 6 (six) hours as needed for wheezing or shortness of breath.    atenolol (TENORMIN) 25 MG tablet Take 25 mg by mouth daily.    atorvastatin (LIPITOR) 20 MG tablet TAKE 1 TABLET BY MOUTH  DAILY    bisoprolol (ZEBETA) 5 MG tablet Take 1 tablet (5 mg total) by mouth daily.    Budeson-Glycopyrrol-Formoterol (BREZTRI AEROSPHERE) 160-9-4.8 MCG/ACT AERO Inhale 2 puffs into the lungs in the morning and at bedtime. 08/07/2020: Need refills   buPROPion (WELLBUTRIN XL) 150 MG 24 hr tablet Take 1 tablet (150 mg total) by mouth daily.    busPIRone (BUSPAR) 5 MG tablet Take 0.5-1 tablets (2.5-5 mg total) by mouth 2 (two) times daily as needed.    Camphor-Menthol-Methyl Sal 3.03-02-08 % PTCH Apply 1 patch topically daily as needed (pain).    hydrochlorothiazide (HYDRODIURIL) 25 MG tablet TAKE 1 TABLET BY MOUTH  DAILY    hydrocortisone (ANUSOL-HC) 25 MG suppository Place 1 suppository (25 mg total) rectally 2 (two) times daily.    Ipratropium-Albuterol (COMBIVENT RESPIMAT) 20-100 MCG/ACT AERS respimat Inhale 1 puff into the lungs every 6 (six) hours as needed for wheezing or shortness of breath.    levothyroxine (SYNTHROID) 25 MCG tablet TAKE 1 TABLET  BY MOUTH  DAILY BEFORE BREAKFAST    Liniments (SALONPAS ARTHRITIS PAIN RELIEF EX) Apply 1 patch topically daily as needed (to painful sites- remove as directed).     metFORMIN (GLUCOPHAGE) 500 MG tablet Take 1 tablet (500 mg total) by mouth 2 (two) times daily with a meal.    montelukast (SINGULAIR) 10 MG tablet TAKE 1 TABLET BY MOUTH AT  BEDTIME    mupirocin cream (BACTROBAN) 2 % Apply topically daily.    naproxen  (NAPROSYN) 500 MG tablet Take 1 tablet (500 mg total) by mouth 2 (two) times daily as needed for moderate pain.    nystatin-triamcinolone ointment (MYCOLOG) Apply 1 application topically 2 (two) times daily as needed (applied to skin folds/groins/under breast skin irritation rash.).    OXYGEN Inhale 2-3 L/min into the lungs See admin instructions. Inhale  2-3 L/min of oxygen into the lungs w/CPAP at bedtime and as needed for shortness of breath with "strenuous activity" during the day    pantoprazole (PROTONIX) 40 MG tablet TAKE 1 TABLET BY MOUTH  DAILY    PATADAY 0.2 % SOLN Apply 1 drop to eye daily.    polyethylene glycol (MIRALAX / GLYCOLAX) 17 g packet Take 17 g by mouth daily as needed for mild constipation.    pregabalin (LYRICA) 150 MG capsule TAKE 1 CAPSULE BY MOUTH AT  BEDTIME    QUEtiapine (SEROQUEL) 50 MG tablet Take 1 tablet (50 mg total) by mouth at bedtime.    roflumilast (DALIRESP) 500 MCG TABS tablet Take 1 tablet (500 mcg total) by mouth daily.    traZODone (DESYREL) 100 MG tablet TAKE 1 TABLET BY MOUTH AT  BEDTIME    venlafaxine XR (EFFEXOR-XR) 150 MG 24 hr capsule TAKE 1 CAPSULE BY MOUTH  DAILY WITH BREAKFAST    witch hazel-glycerin (TUCKS) pad Apply topically as needed for itching.    No facility-administered encounter medications on file as of 10/10/2020.    Patient Active Problem List   Diagnosis Date Noted   Depression 08/16/2020   Steroid-induced hyperglycemia 08/16/2020   COPD exacerbation ([place]) 08/07/2020   PTSD (post-traumatic stress disorder) 02/09/2020   Mood disorder in conditions classified elsewhere 02/09/2020   Generalized anxiety disorder 02/09/2020   Type II diabetes mellitus with manifestations ([place]) 10/29/2019   Coarse tremors 10/28/2019   Chronic rhinitis 10/21/2019   Allergic rhinitis 06/16/2019   Cervical spondylosis without myelopathy 12/18/2018   Hypertension    Encounter for general adult medical examination with abnormal findings 11/06/2018    Tobacco abuse 01/15/2018   Hypomagnesemia 12/29/2017   Pulmonary nodule 11/11/2017   Carotid artery disease ([place]) 11/05/2017   OSA on CPAP 08/07/2017   Gastroesophageal reflux disease 06/23/2017   Hypothyroidism due to acquired atrophy of thyroid 04/05/2017   Primary osteoarthritis involving multiple joints 04/05/2017   Chronic constipation 02/10/2017   Iron deficiency anemia due to dietary causes 02/10/2017   Osteoarthritis 02/10/2017   Hyperlipidemia associated with type 2 diabetes mellitus ([place]) 02/10/2017   Chronic respiratory failure with hypoxia ([place]) 12/16/2016   Essential hypertension 11/29/2016   Vocal cord dysfunction    COPD, severe ([place]) 09/24/2016   Anxiety 09/24/2016    Conditions to be addressed/monitored: COPD; Level of care concerns  Care Plan : [place]  Updates made by Deirdre Peer, LCSW since 10/11/2020 12:00 AM     Problem: Need for patient-centered plan of care to promote improved mental health and overal quality of life.   Priority: High  Long-Range Goal: Develop self management plan to improve pt's mental health and overall health/quality of life.   Start Date: 05/09/2020  Expected End Date: 11/24/2020  This Visit's Progress: On track  Recent Progress: On track  Priority: High  Note:   Current barriers:  CSW advised pt there will be some paperwork/coordination to be done through PCP office if she decides to pursue this option. CSW advised pt to reach out if questions or needs arise that CSW can assist with. Patient in need of assistance with connecting to community resources for Limited social support, Housing barriers, Mental Health Concerns, Family and relationship dysfunction, and lacks knowledge of community resource: housing. Acknowledges deficits with meeting this unmet need Patient is unable to independently navigate community resource options without care coordination support Patient with limited ability to complete applications  for housing due to reported reading limitations Clinical Goals:  patient will work with SW to address concerns related to mental health support/needs patient will work with Care Guide  to address needs related to housing and other community needs as identified patient will follow up with Psychiatrist and Counselor to further discuss concerns and needs as directed by SW Clinical Interventions:  Pt has been pursuing placement at [facility] and completion of FL2 form by PCP is underway. CSW made contact with [facility] and await FL2 being updated with pt's diagnoses, care needs, etc.   CSW updated pt and advised her the insurance will have to approve a SNF rehab stay- uncertain if she will meet criteria for SNF but can certainly try.  Collaboration with [name], MD regarding development and update of comprehensive plan of care as evidenced by provider attestation and co-signature Pt reports continued mental health support/sessions with her outpatient therapist and denies any current significant depression/anxiety. Pt denies SI/HI Inter-disciplinary care team collaboration (see longitudinal plan of care) Assessment of needs, barriers , agencies contacted, as well as how impacting  Review various resources, discussed options and provided patient information about Financial constraints related to housing, etc, Limited social support, Housing barriers, Mental Health Concerns , and Family and relationship dysfunction Collaborated with appropriate clinical care team members regarding patient needs Financial constraints related to housing, Limited social support, Housing barriers, Mental Health Concerns , and Family and relationship dysfunction, COPD, Anxiety, and Depression Patient interviewed and appropriate assessments performed Referred patient to community resources care guide team for assistance with housing and other community support/resources as identified Provided mental health  counseling with regard to pt concerns/needs (mental health diagnosis or concern) Discussed plans with patient for ongoing care management follow up and provided patient with direct contact information for care management team Other interventions provided: Motivational Interviewing Solution-Focused Strategies Brief CBT Emotional/Supportive Counseling Psychoeducation and/or Health Education Problem Solving Patient Goals:   - call 911 when I need emergency help - follow-up on any referrals for help I am given and seek housing options on my own in the local paper and community - think ahead to make sure my need does not become an emergency - make a note about what I need to have by the phone or take with me, like an identification card or social security number have a back-up plan - have a back-up plan - make a list of family or friends that I can call  -follow up with counselor and Psychiatrist at scheduled appointment times to further discuss mental health concerns/needs for more frequent visits as discussed -make Neurologist aware of concerns related to memory issues,  recent stuttering,etc -consider seeing a Nutritionist/RD for healthier diet/choices  Follow Up Plan: Appointment scheduled for SW follow up with client by phone on:  10/25/2020      Follow Up Plan: Appointment scheduled for SW follow up with client by phone on: 10/25/20      Eduard Clos MSW, [place] Licensed Clinical Social Worker [place] [phone]

## 2020-10-14 ENCOUNTER — Telehealth: Payer: Self-pay | Admitting: Internal Medicine

## 2020-10-14 NOTE — Telephone Encounter (Signed)
CSW has advised pt of updated FL2 being received and sent to [facility] per pr request.  [facility] rep has requested additional info and recent H&P and Discharge Summary were also sent.  CSW will inquire further next week with SNF.  Eduard Clos MSW, LCSW Licensed Clinical Social Worker [place] [phone]

## 2020-10-16 ENCOUNTER — Telehealth: Payer: Medicare Other

## 2020-10-16 DIAGNOSIS — J441 Chronic obstructive pulmonary disease with (acute) exacerbation: Secondary | ICD-10-CM | POA: Diagnosis not present

## 2020-10-16 DIAGNOSIS — E119 Type 2 diabetes mellitus without complications: Secondary | ICD-10-CM | POA: Diagnosis not present

## 2020-10-16 DIAGNOSIS — F17210 Nicotine dependence, cigarettes, uncomplicated: Secondary | ICD-10-CM | POA: Diagnosis not present

## 2020-10-16 DIAGNOSIS — M6281 Muscle weakness (generalized): Secondary | ICD-10-CM | POA: Diagnosis not present

## 2020-10-16 DIAGNOSIS — Z741 Need for assistance with personal care: Secondary | ICD-10-CM | POA: Diagnosis not present

## 2020-10-16 DIAGNOSIS — Z9981 Dependence on supplemental oxygen: Secondary | ICD-10-CM | POA: Diagnosis not present

## 2020-10-17 DIAGNOSIS — M6281 Muscle weakness (generalized): Secondary | ICD-10-CM | POA: Diagnosis not present

## 2020-10-17 DIAGNOSIS — E119 Type 2 diabetes mellitus without complications: Secondary | ICD-10-CM | POA: Diagnosis not present

## 2020-10-17 DIAGNOSIS — J441 Chronic obstructive pulmonary disease with (acute) exacerbation: Secondary | ICD-10-CM | POA: Diagnosis not present

## 2020-10-17 DIAGNOSIS — F17210 Nicotine dependence, cigarettes, uncomplicated: Secondary | ICD-10-CM | POA: Diagnosis not present

## 2020-10-17 DIAGNOSIS — Z741 Need for assistance with personal care: Secondary | ICD-10-CM | POA: Diagnosis not present

## 2020-10-17 DIAGNOSIS — Z9981 Dependence on supplemental oxygen: Secondary | ICD-10-CM | POA: Diagnosis not present

## 2020-10-17 NOTE — Telephone Encounter (Signed)
busPIRone (BUSPAR) 5 MG tablet [Pharmacy Med Name: BUSPIRONE HCL 5 MG TABLET] 180 tablet 1 10/16/2020    Request refused: Refill not appropriate (per dr. Sharlet Salina patient will need to contact her mental health provid)     Patient advised medication denied. Patient requesting call back

## 2020-10-18 ENCOUNTER — Other Ambulatory Visit: Payer: Self-pay

## 2020-10-18 ENCOUNTER — Encounter: Payer: Self-pay | Admitting: Acute Care

## 2020-10-18 ENCOUNTER — Ambulatory Visit (INDEPENDENT_AMBULATORY_CARE_PROVIDER_SITE_OTHER): Payer: Medicare Other | Admitting: Acute Care

## 2020-10-18 ENCOUNTER — Telehealth: Payer: Self-pay | Admitting: Internal Medicine

## 2020-10-18 VITALS — BP 122/74 | HR 87 | Temp 97.3°F | Ht 68.0 in | Wt 213.4 lb

## 2020-10-18 DIAGNOSIS — Z741 Need for assistance with personal care: Secondary | ICD-10-CM | POA: Diagnosis not present

## 2020-10-18 DIAGNOSIS — M6281 Muscle weakness (generalized): Secondary | ICD-10-CM | POA: Diagnosis not present

## 2020-10-18 DIAGNOSIS — J9611 Chronic respiratory failure with hypoxia: Secondary | ICD-10-CM

## 2020-10-18 DIAGNOSIS — Z720 Tobacco use: Secondary | ICD-10-CM

## 2020-10-18 DIAGNOSIS — F17210 Nicotine dependence, cigarettes, uncomplicated: Secondary | ICD-10-CM

## 2020-10-18 DIAGNOSIS — Z9981 Dependence on supplemental oxygen: Secondary | ICD-10-CM | POA: Diagnosis not present

## 2020-10-18 DIAGNOSIS — J441 Chronic obstructive pulmonary disease with (acute) exacerbation: Secondary | ICD-10-CM

## 2020-10-18 DIAGNOSIS — E119 Type 2 diabetes mellitus without complications: Secondary | ICD-10-CM | POA: Diagnosis not present

## 2020-10-18 MED ORDER — VARENICLINE TARTRATE 1 MG PO TABS
1.0000 mg | ORAL_TABLET | Freq: Two times a day (BID) | ORAL | 0 refills | Status: DC
Start: 2020-10-18 — End: 2021-02-07

## 2020-10-18 MED ORDER — BREZTRI AEROSPHERE 160-9-4.8 MCG/ACT IN AERO: 2.0000 | INHALATION_SPRAY | Freq: Two times a day (BID) | RESPIRATORY_TRACT | 0 refills | Status: DC

## 2020-10-18 NOTE — Telephone Encounter (Signed)
Spoke with the patient and she has been made aware that she needs to contact her psychiatrist in regards to this medication.

## 2020-10-18 NOTE — Patient Instructions (Addendum)
It is good to see you today. We will Give you some Sample of Breztri. Use 2 puffs in the morning and 2 puffs in the evening. Rinse mouth after use. We will get a spacer sent to you.  Combivent as needed as your rescue inhaler Do not do Symbicort while you are using the Breztri. Continue your Daliresp.  Wear your oxygen 3 L  as prescribed Saturation goals are 88% or greater.  Continue Singulair daily Continue Protonix daily.  Note your daily symptoms > remember "red flags" for COPD:  Increase in cough, increase in sputum production, increase in shortness of breath or activity intolerance. If you notice these symptoms, please call to be seen.    Follow up in 1 months with Judson Roch NP or Dr. Elsworth Soho Please contact office for sooner follow up if symptoms do not improve or worsen or seek emergency care    Please work on quitting smoking immediately.  Call 1-800-quit Now for free nicotine replacement therapy.  We will prescribe Chantix started kit today. Follow up in 1 month to see how you are doing.

## 2020-10-18 NOTE — Telephone Encounter (Signed)
[place] Name: [facility] Agency Name: Doristine Johns Phone #: [phone] Service Requested: OT  (examples: OT/PT/Skilled Nursing/Social Work/Speech Therapy/Wound Care)  Frequency of Visits: 1x for 1wk, 3x for 2wk

## 2020-10-18 NOTE — Progress Notes (Signed)
History of Present Illness Joan [name] is a 60 y.o. female  current every day  smoker with severe COPD and OSA on CPAP.  She is followed by Dr. Elsworth Soho.   Quit smoking 11/2018, and has restarted 04/2020 Symbicort as Maintenance She is using albuterol nebs twice daily She is using her Combivent as her rescue inhaler.  Allergies to tetracycline and Keflex   Recent Admission to the hospital 08/10/2020 for acute COPD exacerbation. She was seen by Dr. Lamonte Sakai and Dr. Chase Caller as an inpatient.She was treated with oxygen, BiPAP therapy, nebulized BD, IV steroids and antibiotics. She was discharged to a skilled nursing home, and is now at home with home health nursing and PT/OT.  10/18/2020 Pt. Presents for follow hospital follow up. She was admitted to the hospital 08/09/2020 for a COPD exacerbation. She was seen as an inpatient by pulmonary . She was treated as above. She called the office complaining of continued dyspnea. She was scheduled as an acute visit to be seen today. She went from the hospital to the a nursing home. She was there for 2 weeks, left July 28th  and now she is home again. She is living in [place] with her Brother. She has home health currently. She is also getting occupational therapy. She is currently smoking. She states she believes she needs an assisted living situation. She is working on getting this done. She is looking at the facility in [facility]. She feels she is at her baseline, or maybe better than her baseline. I have re-iterated that she needs to call to be seen at the earliest signs of a flare so we can treat her before it progresses to a full blown exacerbation.    She is currently taking her Daliresp. She is confused about her inhalers. She has been taking her Symbicort daily. She has been using the Combi vent as rescue. She would like to use Breztri instead of Symbicort.We will give her some Samples of Breztri and send in a prescription . She will use her  Combivent as rescue as she has been doing. I have told her to hang on to her Symbicort in the event she does not do well on the [place].  She is using her CPAP every night. AHI is well controlled at 2.0.  Test Results:    NPSG 02/2017 >> no OSA HST  04/2017 15/h 02/2019 pFTS >> RATIO 45, fev 1 37% - UNCHANGED , + bd response, DLCO 44%  10/2016 PFTs showed ratio 49, FEV1 of 36%, FVC 58% and DLCO of 34% consistent with severe obstruction. 02/2020 LDCT >>  stable nodules  11/2016 CT chest bullous changes BL, 73m RUl & 2 mm LUL  nodules >> stable 11/2017  ONO 11/2016 on CPAP/room air showed desaturation for 1 hour 45 minutes started on nocturnal oxygen>>    CBC Latest Ref Rng & Units 08/15/2020 08/13/2020 08/12/2020  WBC 4.0 - 10.5 K/uL 16.5(H) 16.5(H) 15.1(H)  Hemoglobin 12.0 - 15.0 g/dL 12.6 13.1 13.6  Hematocrit 36.0 - 46.0 % 39.7 38.3 42.3  Platelets 150 - 400 K/uL 269 270 278    BMP Latest Ref Rng & Units 08/15/2020 08/13/2020 08/12/2020  Glucose 70 - 99 mg/dL 178(H) 212(H) 173(H)  BUN 6 - 20 mg/dL 26(H) 28(H) 25(H)  Creatinine 0.44 - 1.00 mg/dL 0.88 0.86 0.92  BUN/Creat Ratio 6 - 22 (calc) - - -  Sodium 135 - 145 mmol/L 139 137 137  Potassium 3.5 - 5.1 mmol/L 4.1 4.1  3.8  Chloride 98 - 111 mmol/L 99 101 96(L)  CO2 22 - 32 mmol/L 32 28 31  Calcium 8.9 - 10.3 mg/dL 9.0 8.8(L) 9.1    BNP    Component Value Date/Time   BNP 76.7 08/07/2020 1943    ProBNP No results found for: PROBNP  PFT    Component Value Date/Time   FEV1PRE 1.06 03/17/2019 1444   FEV1POST 1.24 03/17/2019 1444   FVCPRE 2.35 03/17/2019 1444   FVCPOST 2.66 03/17/2019 1444   TLC 5.72 03/17/2019 1444   DLCOUNC 9.80 03/17/2019 1444   PREFEV1FVCRT 45 03/17/2019 1444   PSTFEV1FVCRT 47 03/17/2019 1444    No results found.   Past medical hx Past Medical History:  Diagnosis Date   Acute respiratory failure with hypoxia ([place])    Alcoholism and drug addiction in family    Anxiety    Appendicitis    Arthritis     "knees, hips, lower back" (09/25/2016)   Asthma    Bipolar disorder ([place])    Chronic bronchitis ([place])    "all the time" (09/25/2016)   Chronic leg pain    RLE   Chronic lower back pain    COPD (chronic obstructive pulmonary disease) ([place])    Depression    Diabetes mellitus without complication (HCC)    Diabetic neuropathy ([place])    Early cataracts, bilateral    Emphysema of lung ([place])    Generalized anxiety disorder 02/09/2020   GERD (gastroesophageal reflux disease)    Headache    Hepatitis C    "treated back in 2001-2002"   High cholesterol    Hypertension    Hypothyroidism    Incarcerated ventral hernia 04/04/2017   Mood disorder in conditions classified elsewhere 02/09/2020   On home oxygen therapy    "2L; at nighttime" (04/04/2017)   OSA on CPAP    CPAP with Oxygen 2 liters   Paranoid ([place])    Pneumonia    "all the time" (09/25/2016)   Positive PPD    with negative chest x ray   PTSD (post-traumatic stress disorder) 02/09/2020   Tachycardia    patient reports with exertion ; denies any other conditions    Thyroid disease    Wears glasses    Wears partial dentures      Social History   Tobacco Use   Smoking status: Every Day    Packs/day: 0.50    Years: 44.00    Pack years: 22.00    Types: Cigarettes   Smokeless tobacco: Never   Tobacco comments:    down to 0.5ppd 06/30/20/jm  Vaping Use   Vaping Use: Never used  Substance Use Topics   Alcohol use: Not Currently   Drug use: Not Currently    Types: "Crack" cocaine    Comment:  "nothing since 01/12/2012"    Ms.Leece reports that she has been smoking cigarettes. She has a 22.00 pack-year smoking history. She has never used smokeless tobacco. She reports that she does not currently use alcohol. She reports that she does not currently use drugs after having used the following drugs: "Crack" cocaine.  Tobacco Cessation: Current every day smoker  Past surgical hx, Family hx, Social hx all reviewed.  Current  Outpatient Medications on File Prior to Visit  Medication Sig   acyclovir (ZOVIRAX) 400 MG tablet TAKE 1 TABLET BY MOUTH  TWICE DAILY   albuterol (PROVENTIL) (2.5 MG/3ML) 0.083% nebulizer solution Take 3 mLs (2.5 mg total) by nebulization 2 (two) times daily. And every  6 hours as needed.  DX: J44.9   albuterol (VENTOLIN HFA) 108 (90 Base) MCG/ACT inhaler Inhale 2 puffs into the lungs every 6 (six) hours as needed for wheezing or shortness of breath.   atenolol (TENORMIN) 25 MG tablet Take 25 mg by mouth daily.   atorvastatin (LIPITOR) 20 MG tablet TAKE 1 TABLET BY MOUTH  DAILY   bisoprolol (ZEBETA) 5 MG tablet Take 1 tablet (5 mg total) by mouth daily.   buPROPion (WELLBUTRIN XL) 150 MG 24 hr tablet Take 1 tablet (150 mg total) by mouth daily.   busPIRone (BUSPAR) 5 MG tablet Take 0.5-1 tablets (2.5-5 mg total) by mouth 2 (two) times daily as needed.   Camphor-Menthol-Methyl Sal 3.03-02-08 % PTCH Apply 1 patch topically daily as needed (pain).   hydrochlorothiazide (HYDRODIURIL) 25 MG tablet TAKE 1 TABLET BY MOUTH  DAILY   hydrocortisone (ANUSOL-HC) 25 MG suppository Place 1 suppository (25 mg total) rectally 2 (two) times daily.   Ipratropium-Albuterol (COMBIVENT RESPIMAT) 20-100 MCG/ACT AERS respimat Inhale 1 puff into the lungs every 6 (six) hours as needed for wheezing or shortness of breath.   levothyroxine (SYNTHROID) 25 MCG tablet TAKE 1 TABLET BY MOUTH  DAILY BEFORE BREAKFAST   Liniments (SALONPAS ARTHRITIS PAIN RELIEF EX) Apply 1 patch topically daily as needed (to painful sites- remove as directed).    metFORMIN (GLUCOPHAGE) 500 MG tablet Take 1 tablet (500 mg total) by mouth 2 (two) times daily with a meal.   montelukast (SINGULAIR) 10 MG tablet TAKE 1 TABLET BY MOUTH AT  BEDTIME   mupirocin cream (BACTROBAN) 2 % Apply topically daily.   naproxen (NAPROSYN) 500 MG tablet Take 1 tablet (500 mg total) by mouth 2 (two) times daily as needed for moderate pain.   nystatin-triamcinolone  ointment (MYCOLOG) Apply 1 application topically 2 (two) times daily as needed (applied to skin folds/groins/under breast skin irritation rash.).   OXYGEN Inhale 2-3 L/min into the lungs See admin instructions. Inhale  2-3 L/min of oxygen into the lungs w/CPAP at bedtime and as needed for shortness of breath with "strenuous activity" during the day   pantoprazole (PROTONIX) 40 MG tablet TAKE 1 TABLET BY MOUTH  DAILY   PATADAY 0.2 % SOLN Apply 1 drop to eye daily.   polyethylene glycol (MIRALAX / GLYCOLAX) 17 g packet Take 17 g by mouth daily as needed for mild constipation.   pregabalin (LYRICA) 150 MG capsule TAKE 1 CAPSULE BY MOUTH AT  BEDTIME   QUEtiapine (SEROQUEL) 50 MG tablet Take 1 tablet (50 mg total) by mouth at bedtime.   roflumilast (DALIRESP) 500 MCG TABS tablet Take 1 tablet (500 mcg total) by mouth daily.   traZODone (DESYREL) 100 MG tablet TAKE 1 TABLET BY MOUTH AT  BEDTIME   venlafaxine XR (EFFEXOR-XR) 150 MG 24 hr capsule TAKE 1 CAPSULE BY MOUTH  DAILY WITH BREAKFAST   witch hazel-glycerin (TUCKS) pad Apply topically as needed for itching.   No current facility-administered medications on file prior to visit.     Allergies  Allergen Reactions   Keflex [Cephalexin] Rash and Other (See Comments)    Has tolerated amoxicillin since had keflex   Prednisone Other (See Comments)    "counter reacts"   Shellfish Allergy Shortness Of Breath, Nausea And Vomiting and Other (See Comments)    Stomach cramps   Tetracyclines & Related Anaphylaxis, Swelling and Other (See Comments)    Throat swelling requiring hospitalization   Dilaudid [Hydromorphone Hcl] Other (See Comments)    "  Lethargy"   Incruse Ellipta [Umeclidinium Bromide] Nausea Only   Other    Tuberculin Tests Other (See Comments)    False postive    Review Of Systems:  Constitutional:   No  weight loss, night sweats,  Fevers, chills, fatigue, or  lassitude.  HEENT:   No headaches,  Difficulty swallowing,  Tooth/dental  problems, or  Sore throat,                No sneezing, itching, ear ache, nasal congestion, post nasal drip,   CV:  No chest pain,  Orthopnea, PND, swelling in lower extremities, anasarca, dizziness, palpitations, syncope.   GI  No heartburn, indigestion, abdominal pain, nausea, vomiting, diarrhea, change in bowel habits, loss of appetite, bloody stools.   Resp: + shortness of breath with exertion less at rest.  + baseline  excess mucus, + baseline  productive cough,  No non-productive cough,  No coughing up of blood.  No change in color of mucus.  + occasional  wheezing.  No chest wall deformity  Skin: no rash or lesions.  GU: no dysuria, change in color of urine, no urgency or frequency.  No flank pain, no hematuria   MS:  No joint pain or swelling.  No decreased range of motion.  No back pain.  Psych:  No change in mood or affect. No depression or anxiety.  No memory loss.   Vital Signs BP 122/74 (BP Location: Right Arm, Cuff Size: Normal)   Pulse 87   Temp (!) 97.3 F (36.3 C) (Oral)   Ht _0  (1.727 m)   Wt 213 lb 6.4 oz (96.8 kg)   SpO2 94%   BMI 32.45 kg/m    Physical Exam:  General- No distress,  A&Ox3, pleasant ENT: No sinus tenderness, TM clear, pale nasal mucosa, no oral exudate,no post nasal drip, no LAN Cardiac: S1, S2, regular rate and rhythm, no murmur Chest: No wheeze/ rales/ dullness; no accessory muscle use, no nasal flaring, no sternal retractions, diminished per bases Abd.: Soft Non-tender Ext: No clubbing cyanosis, edema, no obvious deformities Neuro: physically deconditioned , MAE x 4, A&O x 3, In a wheel chair Skin: No rashes, warm and dry, no lesions Psych: normal mood and behavior   Assessment/Plan Resolved COPD Flare requiring hospital Admission Plan We will Give you some Sample of Breztri. Use 2 puffs in the morning and 2 puffs in the evening. Rinse mouth after use. Do not do Symbicort while you are using the [facility]. We will get a spacer  sent to you.  Continue Daliresp and Singulair  and protonix as you have been doing  Use Combivent as rescue per Dr. Bari Mantis recommendation.  Add Mucinex 1200 mg once daily , take with full glass of water.  Start using your Flutter valve, 4 blows three times daily. Make sure you clean flutter valve once a week. This should help thin your secretions and get them up.  Note your daily symptoms > remember "red flags" for COPD:  Increase in cough, increase in sputum production, increase in shortness of breath or activity intolerance. If you notice these symptoms, please call to be seen.    Follow up in 1 months with Judson Roch NP or Dr. Elsworth Soho Please contact office for sooner follow up if symptoms do not improve or worsen or seek emergency care    Tobacco Abuse Plan I have spent 4 minutes counseling patient on smoking cessation this visit. We have reviewed the risks of continued smoking on  his current health situation. Patient verbalizes understanding of their  choice to continue smoking and the negative health consequences including worsening of COPD, risk of lung cancer , stroke and heart disease.Marland Kitchen    Please work on quitting smoking immediately.  Call 1-800-quit Now for free nicotine replacement therapy.  We will prescribe Chantix started kit today. Follow up in 1 month to see how you are doing.   I spent 45 minutes dedicated to the care of this patient on the date of this encounter to include pre-visit review of records, face-to-face time with the patient discussing conditions above, post visit ordering of testing, clinical documentation with the electronic health record, making appropriate referrals as documented, and communicating necessary information to the patient's healthcare team.    [name], NP 10/18/2020  4:19 PM

## 2020-10-19 DIAGNOSIS — J449 Chronic obstructive pulmonary disease, unspecified: Secondary | ICD-10-CM | POA: Diagnosis not present

## 2020-10-19 DIAGNOSIS — J441 Chronic obstructive pulmonary disease with (acute) exacerbation: Secondary | ICD-10-CM | POA: Diagnosis not present

## 2020-10-19 DIAGNOSIS — J9611 Chronic respiratory failure with hypoxia: Secondary | ICD-10-CM | POA: Diagnosis not present

## 2020-10-19 DIAGNOSIS — G4733 Obstructive sleep apnea (adult) (pediatric): Secondary | ICD-10-CM | POA: Diagnosis not present

## 2020-10-19 NOTE — Telephone Encounter (Signed)
Okay for verbals °

## 2020-10-19 NOTE — Telephone Encounter (Signed)
See below

## 2020-10-20 NOTE — Telephone Encounter (Signed)
Perkins. LVM with verbal orders for the patient. Office number was provided in case she has additional questions or concerns.

## 2020-10-22 ENCOUNTER — Other Ambulatory Visit (HOSPITAL_COMMUNITY): Payer: Self-pay | Admitting: Psychiatry

## 2020-10-23 ENCOUNTER — Ambulatory Visit: Payer: Medicare Other | Admitting: Podiatry

## 2020-10-23 ENCOUNTER — Telehealth: Payer: Self-pay | Admitting: Acute Care

## 2020-10-23 DIAGNOSIS — E119 Type 2 diabetes mellitus without complications: Secondary | ICD-10-CM | POA: Diagnosis not present

## 2020-10-23 DIAGNOSIS — J441 Chronic obstructive pulmonary disease with (acute) exacerbation: Secondary | ICD-10-CM | POA: Diagnosis not present

## 2020-10-23 DIAGNOSIS — Z9981 Dependence on supplemental oxygen: Secondary | ICD-10-CM | POA: Diagnosis not present

## 2020-10-23 DIAGNOSIS — M6281 Muscle weakness (generalized): Secondary | ICD-10-CM | POA: Diagnosis not present

## 2020-10-23 DIAGNOSIS — F17210 Nicotine dependence, cigarettes, uncomplicated: Secondary | ICD-10-CM | POA: Diagnosis not present

## 2020-10-23 DIAGNOSIS — Z741 Need for assistance with personal care: Secondary | ICD-10-CM | POA: Diagnosis not present

## 2020-10-23 NOTE — Telephone Encounter (Signed)
Primary Pulmonologist: Elsworth Soho  Last office visit and with whom: 10/18/20 SG What do we see them for (pulmonary problems): Tobacco abuse  COPD with acute exacerbation (HCC)  Chronic respiratory failure with hypoxia (HCC) Last OV assessment/plan: Assessment/Plan Resolved COPD Flare requiring hospital Admission Plan We will Give you some Sample of Breztri. Use 2 puffs in the morning and 2 puffs in the evening. Rinse mouth after use. Do not do Symbicort while you are using the [facility]. We will get a spacer sent to you.  Continue Daliresp and Singulair  and protonix as you have been doing  Use Combivent as rescue per Dr. Bari Mantis recommendation.  Add Mucinex 1200 mg once daily , take with full glass of water.  Start using your Flutter valve, 4 blows three times daily. Make sure you clean flutter valve once a week. This should help thin your secretions and get them up.  Note your daily symptoms > remember "red flags" for COPD:  Increase in cough, increase in sputum production, increase in shortness of breath or activity intolerance. If you notice these symptoms, please call to be seen.    Follow up in 1 months with Judson Roch NP or Dr. Elsworth Soho Please contact office for sooner follow up if symptoms do not improve or worsen or seek emergency care     Tobacco Abuse Plan I have spent 4 minutes counseling patient on smoking cessation this visit. We have reviewed the risks of continued smoking on his current health situation. Patient verbalizes understanding of their  choice to continue smoking and the negative health consequences including worsening of COPD, risk of lung cancer , stroke and heart disease.Marland Kitchen    Please work on quitting smoking immediately.  Call 1-800-quit Now for free nicotine replacement therapy.  We will prescribe Chantix started kit today. Follow up in 1 month to see how you are doing.       Reason for call: Pt stated wheezing and SOB that has been going on for "years". Pt states being  tired all the time. Pt states these symptoms were before during and after OV on 10/18/20. Pt states using Breztri and rescue inhaler as instructed. Pt states no V/F/C but states she has had nausea and diarrhea for 1 week. Pt is currently on 3L of O2 at all times. Pt states having no way to check her O2 saturation but that is has been good when PT checked it last week. Judson Roch could you please advise as Dr. Elsworth Soho is currently unavailable? Thank you   (examples of things to ask: : When did symptoms start? Fever? Cough? Productive? Color to sputum? More sputum than usual? Wheezing? Have you needed increased oxygen? Are you taking your respiratory medications? What over the counter measures have you tried?)  Allergies  Allergen Reactions   Keflex [Cephalexin] Rash and Other (See Comments)    Has tolerated amoxicillin since had keflex   Prednisone Other (See Comments)    "counter reacts"   Shellfish Allergy Shortness Of Breath, Nausea And Vomiting and Other (See Comments)    Stomach cramps   Tetracyclines & Related Anaphylaxis, Swelling and Other (See Comments)    Throat swelling requiring hospitalization   Dilaudid [Hydromorphone Hcl] Other (See Comments)    "Lethargy"   Incruse Ellipta [Umeclidinium Bromide] Nausea Only   Other    Tuberculin Tests Other (See Comments)    False postive    Immunization History  Administered Date(s) Administered   Influenza,inj,Quad PF,6+ Mos 11/25/2016, 11/11/2017, 11/09/2018, 10/28/2019   PFIZER(Purple  Top)SARS-COV-2 Vaccination 06/04/2019, 06/28/2019   Tdap 12/17/2018

## 2020-10-24 ENCOUNTER — Telehealth: Payer: Medicare Other

## 2020-10-24 ENCOUNTER — Telehealth: Payer: Self-pay | Admitting: Pharmacist

## 2020-10-24 DIAGNOSIS — M47812 Spondylosis without myelopathy or radiculopathy, cervical region: Secondary | ICD-10-CM | POA: Diagnosis not present

## 2020-10-24 NOTE — Telephone Encounter (Signed)
  Chronic Care Management   Outreach Note  10/24/2020 Name: Kimimila Cammon MRN: [id number] DOB: 08/15/1960  Referred by: [name], MD  Patient had a phone appointment scheduled with clinical pharmacist today.  An unsuccessful telephone outreach was attempted today. The patient was referred to the pharmacist for assistance with medications, care management and care coordination.   Patient will NOT be penalized in any way for missing a CCM appointment. The no-show fee does not apply.  If possible, a message was left to return call to: [phone] or to [facility]: [place], PharmD, Para [place], CPP Clinical Pharmacist [facility] [phone]

## 2020-10-24 NOTE — Progress Notes (Signed)
Chronic Care Management Pharmacy Assistant   Name: Joan [name]  MRN: [id number] DOB: 08/19/1960   Reason for Encounter: Disease State   Conditions to be addressed/monitored: General   Recent office visits:  09/19/20 [name], MD- (PCP) COPD exacerbation [facility] [place]) busPIRone (BUSPAR) 5 MG tablet  Recent consult visits:  10/18/20 [name], NP-Pulmonary (Tobacco abuse) We will Give you some Sample of Breztri. Use 2 puffs in the morning and 2 puffs in the evening  06/30/20 [name], MD-Pulmonary (Chronic respiratory failure with hypoxia) Budeson-Glycopyrrol-Formoterol (BREZTRI AEROSPHERE) 160-9-4.8 MCG/ACT [facility] 2 visits:  Medication Reconciliation was completed by comparing discharge summary, patient's EMR and Pharmacy list, and upon discussion with patient.  Admitted to the hospital on 08/07/20 due to COPD exacerbation. Discharge date was 08/22/20. Discharged from [facility]?Medications Started at [facility] Discharge:?? -started Bisoprolol  Hydrocortisone Metformin Mupirocin Witch hazel Medication Changes at Hospital Discharge: -Changed albuterol Nystatin-triamcinolone  Medications Discontinued at Hospital Discharge: -Stopped atenolol Budesonide Cyclobenzaprine diazepam  Medications that remain the same after Hospital Discharge:??  -All other medications will remain the same.    Medications: Outpatient Encounter Medications as of 10/24/2020  Medication Sig   acyclovir (ZOVIRAX) 400 MG tablet TAKE 1 TABLET BY MOUTH  TWICE DAILY   albuterol (PROVENTIL) (2.5 MG/3ML) 0.083% nebulizer solution Take 3 mLs (2.5 mg total) by nebulization 2 (two) times daily. And every 6 hours as needed.  DX: J44.9   albuterol (VENTOLIN HFA) 108 (90 Base) MCG/ACT inhaler Inhale 2 puffs into the lungs every 6 (six) hours as needed for wheezing or shortness of breath.   atenolol (TENORMIN) 25 MG tablet Take 25 mg by mouth daily.   atorvastatin  (LIPITOR) 20 MG tablet TAKE 1 TABLET BY MOUTH  DAILY   bisoprolol (ZEBETA) 5 MG tablet Take 1 tablet (5 mg total) by mouth daily.   Budeson-Glycopyrrol-Formoterol (BREZTRI AEROSPHERE) 160-9-4.8 MCG/ACT AERO Inhale 2 puffs into the lungs in the morning and at bedtime.   Budeson-Glycopyrrol-Formoterol (BREZTRI AEROSPHERE) 160-9-4.8 MCG/ACT AERO Inhale 2 puffs into the lungs in the morning and at bedtime.   buPROPion (WELLBUTRIN XL) 150 MG 24 hr tablet Take 1 tablet (150 mg total) by mouth daily.   busPIRone (BUSPAR) 5 MG tablet Take 0.5-1 tablets (2.5-5 mg total) by mouth 2 (two) times daily as needed.   Camphor-Menthol-Methyl Sal 3.03-02-08 % PTCH Apply 1 patch topically daily as needed (pain).   hydrochlorothiazide (HYDRODIURIL) 25 MG tablet TAKE 1 TABLET BY MOUTH  DAILY   hydrocortisone (ANUSOL-HC) 25 MG suppository Place 1 suppository (25 mg total) rectally 2 (two) times daily.   Ipratropium-Albuterol (COMBIVENT RESPIMAT) 20-100 MCG/ACT AERS respimat Inhale 1 puff into the lungs every 6 (six) hours as needed for wheezing or shortness of breath.   levothyroxine (SYNTHROID) 25 MCG tablet TAKE 1 TABLET BY MOUTH  DAILY BEFORE BREAKFAST   Liniments (SALONPAS ARTHRITIS PAIN RELIEF EX) Apply 1 patch topically daily as needed (to painful sites- remove as directed).    metFORMIN (GLUCOPHAGE) 500 MG tablet Take 1 tablet (500 mg total) by mouth 2 (two) times daily with a meal.   montelukast (SINGULAIR) 10 MG tablet TAKE 1 TABLET BY MOUTH AT  BEDTIME   mupirocin cream (BACTROBAN) 2 % Apply topically daily.   naproxen (NAPROSYN) 500 MG tablet Take 1 tablet (500 mg total) by mouth 2 (two) times daily as needed for moderate pain.   nystatin-triamcinolone ointment (MYCOLOG) Apply 1 application topically 2 (two) times  daily as needed (applied to skin folds/groins/under breast skin irritation rash.).   OXYGEN Inhale 2-3 L/min into the lungs See admin instructions. Inhale  2-3 L/min of oxygen into the lungs w/CPAP at  bedtime and as needed for shortness of breath with "strenuous activity" during the day   pantoprazole (PROTONIX) 40 MG tablet TAKE 1 TABLET BY MOUTH  DAILY   PATADAY 0.2 % SOLN Apply 1 drop to eye daily.   polyethylene glycol (MIRALAX / GLYCOLAX) 17 g packet Take 17 g by mouth daily as needed for mild constipation.   pregabalin (LYRICA) 150 MG capsule TAKE 1 CAPSULE BY MOUTH AT  BEDTIME   QUEtiapine (SEROQUEL) 50 MG tablet Take 1 tablet (50 mg total) by mouth at bedtime.   roflumilast (DALIRESP) 500 MCG TABS tablet Take 1 tablet (500 mcg total) by mouth daily.   traZODone (DESYREL) 100 MG tablet TAKE 1 TABLET BY MOUTH AT  BEDTIME   varenicline (CHANTIX CONTINUING MONTH PAK) 1 MG tablet Take 1 tablet (1 mg total) by mouth 2 (two) times daily.   venlafaxine XR (EFFEXOR-XR) 150 MG 24 hr capsule TAKE 1 CAPSULE BY MOUTH  DAILY WITH BREAKFAST   witch hazel-glycerin (TUCKS) pad Apply topically as needed for itching.   No facility-administered encounter medications on file as of 10/24/2020.    Contacted [facility] on 10/24/20 for general disease state and medication adherence call.  Contacted [facility] on 10/25/20 for general disease state and medication adherence call.   Patient is not > 5 days past due for refill on the following medications per chart history:  [facility] Medications: Medication Name/mg Last Fill Days Supply Metformin 500 mg  08/23/18 30 (patient states that she does not take anymore) Atorvastatin 20 mg  09/10/20 90  What concerns do you have about your medications? Patient states that she is taking samples Breztri and it works fine. Her concern is that she has trouble keeping track of them because she takes to many  The patient denies side effects with her medications.   How often do you forget or accidentally miss a dose? Once a week sometimes she gets confused with her day time and night time meds  Do you use a pillbox? Yes, patient states that when she is putting  meds in pillbox she gets them mixed up, then takes them out and tries to put them back and that's when they get mixed up  Are you having any problems getting your medications from your pharmacy? No  Has the cost of your medications been a concern? No If yes, what medication and is patient assistance available or has it been applied for?  Since last visit with CPP, no interventions have been made:   The patient has had an ED visit since last contact.   The patient reports the following problems with their health. Patient states that she feels like she is getting pinched or bitten all over her body and she does not know why  she reports the following  concerns or questions for [employer], Pharm. D at this time. Patient states that she would like some help with getting into an assistant living facility. She states that she believes it will help her get motivated and tat her legs would become stronger  Counseled patient on:  Importance of taking medication daily without missed doses and Benefits of adherence packaging or a pillbox   Care Gaps: Last annual wellness visit:None noted If Diabetic: Last eye exam / retinopathy screening:None  noted Last diabetic foot exam:03/07/20  CCM appointment on 10/24/20    Ethelene Hal Clinical Pharmacist Assistant [phone]   Time spent:51

## 2020-10-24 NOTE — Progress Notes (Deleted)
Chronic Care Management Pharmacy Note  10/24/2020 Name:  Huberta Tompkins MRN:  [id number] DOB:  Mar 05, 1960  Summary: ***  Recommendations/Changes made from today's visit: ***  Plan: ***   Subjective: Joan [name] is an 60 y.o. year old female who is a primary patient of [name], MD.  The CCM team was consulted for assistance with disease management and care coordination needs.    {CCMTELEPHONEFACETOFACE:21091510} for {CCMINITIALFOLLOWUPCHOICE:21091511} in response to provider referral for pharmacy case management and/or care coordination services.   Consent to Services:  {CCMCONSENTOPTIONS:25074}  Patient Care Team: [name], MD as PCP - General (Internal Medicine) Lorretta Harp, MD as PCP - Cardiology (Cardiology) Thornton Park, MD as Consulting Physician (Gastroenterology) Gevena Cotton, MD as Consulting Physician (Ophthalmology) Paralee Cancel, MD as Consulting Physician (Orthopedic Surgery) Charlton Haws, [facility] as Pharmacist (Pharmacist) Knox Royalty, RN as Case Manager Deirdre Peer, LCSW as Social Worker (Licensed Clinical Social Worker)  Recent office visits: 09/19/20 Dr Sharlet Salina OV: f/u, recent hospitalization. Rx's buspirone x 1 month and advised to return to psychiatrist for ongoing mgmt. Declined to rx xanax.  Recent consult visits: 10/23/20 Pulmonary TE - Start doxycycline.   10/18/20 NP Eric Form (pulmonary): f/u COPD. Recent hospitalization 08/10/20 for copd exacerbation. Given samples of Breztri. Sent spacer. Add Mucinex. Start Chantix.  10/04/20 Dr Dawley (Neurosurgery/spine): f/u cervicogenic headache. Pt is not a surgical candidate, advised f/u with pain management for possible spinal stimulator.  08/24/20 Dr De Nurse [facility]): no change. Advised to avoid xanax.  Hospital visits: Medication Reconciliation was completed by comparing discharge summary, patient's EMR and Pharmacy list, and upon discussion  with patient.  Admitted to the hospital on 08/07/20 due to COPD exacerbation. Discharge date was 08/22/20. Discharged from [facility] to SNF.   New?Medications Started at [facility] Discharge:?? -Started metformin due to steroid-induced hyperglycemia -Started xanax (short term only - taper at SNF)  Medication Changes at Hospital Discharge: -Changed atenolol to bisoprolol (formulary?)  Medications Discontinued at Hospital Discharge: -Stopped cyclobenzaprine, diazepam   Medications that remain the same after Hospital Discharge:??  -All other medications will remain the same.     Objective:  Lab Results  Component Value Date   CREATININE 0.88 08/15/2020   BUN 26 (H) 08/15/2020   GFR 55.45 (L) 03/07/2020   GFRNONAA >60 08/15/2020   GFRAA 59 (L) 10/28/2019   NA 139 08/15/2020   K 4.1 08/15/2020   CALCIUM 9.0 08/15/2020   CO2 32 08/15/2020   GLUCOSE 178 (H) 08/15/2020    Lab Results  Component Value Date/Time   HGBA1C 6.8 (H) 08/09/2020 05:37 AM   HGBA1C 6.9 (H) 08/08/2020 05:50 AM   GFR 55.45 (L) 03/07/2020 03:05 PM   GFR 54.52 (L) 04/29/2019 04:00 PM   MICROALBUR <0.7 12/17/2018 02:02 PM    Last diabetic Eye exam: No results found for: HMDIABEYEEXA  Last diabetic Foot exam: No results found for: HMDIABFOOTEX   Lab Results  Component Value Date   CHOL 141 03/07/2020   HDL 54.30 03/07/2020   LDLCALC 58 03/07/2020   LDLDIRECT 56.0 12/17/2018   TRIG 143.0 03/07/2020   CHOLHDL 3 03/07/2020    Hepatic Function Latest Ref Rng & Units 08/16/2020 08/09/2020 08/07/2020  Total Protein 6.5 - 8.1 g/dL 5.9(L) 7.5 7.1  Albumin 3.5 - 5.0 g/dL 3.3(L) 3.9 4.0  AST 15 - 41 U/L 20 34 29  ALT 0 - 44 U/L 26 35 30  Alk Phosphatase 38 - 126 U/L 77  97 117  Total Bilirubin 0.3 - 1.2 mg/dL 0.6 1.5(H) 0.3  Bilirubin, Direct 0.0 - 0.2 mg/dL 0.2 - -    Lab Results  Component Value Date/Time   TSH 0.89 10/28/2019 04:31 PM   TSH 1.156 03/30/2019 07:15 PM   TSH 0.251 (L) 12/29/2017  06:09 AM   FREET4 0.84 12/29/2017 08:47 AM   FREET4 0.72 09/27/2016 06:53 AM    CBC Latest Ref Rng & Units 08/15/2020 08/13/2020 08/12/2020  WBC 4.0 - 10.5 K/uL 16.5(H) 16.5(H) 15.1(H)  Hemoglobin 12.0 - 15.0 g/dL 12.6 13.1 13.6  Hematocrit 36.0 - 46.0 % 39.7 38.3 42.3  Platelets 150 - 400 K/uL 269 270 278    No results found for: VD25OH  Clinical ASCVD: No  The 10-year ASCVD risk score [name], et al., 2013) is: 10.2%   Values used to calculate the score:     Age: 70 years     Sex: Female     Is Non-Hispanic African American: No     Diabetic: Yes     Tobacco smoker: Yes     Systolic Blood Pressure: 315 mmHg     Is BP treated: Yes     HDL Cholesterol: 54.3 mg/dL     Total Cholesterol: 141 mg/dL    Depression screen [facility] 2/9 09/19/2020 05/31/2020 05/05/2020  Decreased Interest 1 1 1   Down, Depressed, Hopeless 1 1 1   PHQ - 2 Score 2 2 2   Altered sleeping 2 - 1  Tired, decreased energy 1 - 1  Change in appetite 1 - 2  Feeling bad or failure about yourself  0 - 1  Trouble concentrating 0 - 1  Moving slowly or fidgety/restless 1 - 1  Suicidal thoughts 0 - 0  PHQ-9 Score 7 - 9  Difficult doing work/chores - - Somewhat difficult  Some recent data might be hidden    Social History   Tobacco Use  Smoking Status Every Day   Packs/day: 0.50   Years: 44.00   Pack years: 22.00   Types: Cigarettes  Smokeless Tobacco Never  Tobacco Comments   down to 0.5ppd 06/30/20/jm   BP Readings from Last 3 Encounters:  10/18/20 122/74  09/19/20 136/80  08/22/20 (!) 105/92   Pulse Readings from Last 3 Encounters:  10/18/20 87  09/19/20 77  08/22/20 (!) 101   Wt Readings from Last 3 Encounters:  10/18/20 213 lb 6.4 oz (96.8 kg)  09/19/20 215 lb 12.8 oz (97.9 kg)  08/15/20 243 lb 2.7 oz (110.3 kg)   BMI Readings from Last 3 Encounters:  10/18/20 32.45 kg/m  09/19/20 32.81 kg/m  08/15/20 36.97 kg/m    Assessment/Interventions: Review of patient past medical history,  allergies, medications, health status, including review of consultants reports, laboratory and other test data, was performed as part of comprehensive evaluation and provision of chronic care management services.   SDOH:  (Social Determinants of Health) assessments and interventions performed: Yes  SDOH Screenings   Alcohol Screen: Not on file  Depression (PHQ2-9): Medium Risk   PHQ-2 Score: 7  Financial Resource Strain: High Risk   Difficulty of Paying Living Expenses: Hard  Food Insecurity: Food Insecurity Present   Worried About Charity fundraiser in the Last Year: Sometimes true   Ran Out of Food in the Last Year: Sometimes true  Housing: Not on file  Physical Activity: Not on file  Social Connections: Not on file  Stress: Stress Concern Present   Feeling of Stress : Very  much  Tobacco Use: High Risk   Smoking Tobacco Use: Every Day   Smokeless Tobacco Use: Never  Transportation Needs: No Transportation Needs   Lack of Transportation (Medical): No   Lack of Transportation (Non-Medical): No    CCM Care Plan  Allergies  Allergen Reactions   Keflex [Cephalexin] Rash and Other (See Comments)    Has tolerated amoxicillin since had keflex   Prednisone Other (See Comments)    "counter reacts"   Shellfish Allergy Shortness Of Breath, Nausea And Vomiting and Other (See Comments)    Stomach cramps   Tetracyclines & Related Anaphylaxis, Swelling and Other (See Comments)    Throat swelling requiring hospitalization   Dilaudid [Hydromorphone Hcl] Other (See Comments)    "Lethargy"   Incruse Ellipta [Umeclidinium Bromide] Nausea Only   Other    Tuberculin Tests Other (See Comments)    False postive    Medications Reviewed Today     Reviewed by [name], NP (Nurse Practitioner) on 10/18/20 at 1628  Med List Status: <None>   Medication Order Taking? Sig Documenting Provider Last Dose Status Informant  acyclovir (ZOVIRAX) 400 MG tablet [id number]  TAKE 1 TABLET BY MOUTH   TWICE DAILY [name], MD  Active Self  albuterol (PROVENTIL) (2.5 MG/3ML) 0.083% nebulizer solution [id number]  Take 3 mLs (2.5 mg total) by nebulization 2 (two) times daily. And every 6 hours as needed.  DX: J44.9 [name], MD  Active Self  albuterol (VENTOLIN HFA) 108 (90 Base) MCG/ACT inhaler [id number]  Inhale 2 puffs into the lungs every 6 (six) hours as needed for wheezing or shortness of breath. [name], MD  Active   atenolol (TENORMIN) 25 MG tablet [id number]  Take 25 mg by mouth daily. [provider]  Active   atorvastatin (LIPITOR) 20 MG tablet [id number]  TAKE 1 TABLET BY MOUTH  DAILY [name], MD  Active Self  bisoprolol (ZEBETA) 5 MG tablet [id number]  Take 1 tablet (5 mg total) by mouth daily. Samuella Cota, MD  Active   Budeson-Glycopyrrol-Formoterol (BREZTRI AEROSPHERE) 160-9-4.8 MCG/ACT Hollie Salk [id number]  Inhale 2 puffs into the lungs in the morning and at bedtime. [name], NP  Active   Budeson-Glycopyrrol-Formoterol (BREZTRI AEROSPHERE) 160-9-4.8 MCG/ACT Hollie Salk [id number] Yes Inhale 2 puffs into the lungs in the morning and at bedtime. [name], NP  Active   buPROPion (WELLBUTRIN XL) 150 MG 24 hr tablet [id number]  Take 1 tablet (150 mg total) by mouth daily. [name], MD  Active   busPIRone (BUSPAR) 5 MG tablet [id number]  Take 0.5-1 tablets (2.5-5 mg total) by mouth 2 (two) times daily as needed. [name], MD  Active   Camphor-Menthol-Methyl Sal 3.03-02-08 % [facility] [id number]  Apply 1 patch topically daily as needed (pain). [provider]  Active Self  hydrochlorothiazide (HYDRODIURIL) 25 MG tablet [id number]  TAKE 1 TABLET BY MOUTH  DAILY [name], MD  Active Self  hydrocortisone (ANUSOL-HC) 25 MG suppository [id number]  Place 1 suppository (25 mg total) rectally 2 (two) times daily. Samuella Cota, MD  Active   Ipratropium-Albuterol (COMBIVENT RESPIMAT) 20-100 MCG/ACT AERS respimat  [id number]  Inhale 1 puff into the lungs every 6 (six) hours as needed for wheezing or shortness of breath. Samuella Cota, MD  Active   levothyroxine (SYNTHROID) 25 MCG tablet [id number]  TAKE 1 TABLET BY MOUTH  DAILY BEFORE BREAKFAST [name], MD  Active Self  [facility] ARTHRITIS  PAIN RELIEF EX) [id number]  Apply 1 patch topically daily as needed (to painful sites- remove as directed).  [provider]  Active Self  metFORMIN (GLUCOPHAGE) 500 MG tablet [id number]  Take 1 tablet (500 mg total) by mouth 2 (two) times daily with a meal. Samuella Cota, MD  Active   montelukast (SINGULAIR) 10 MG tablet [id number]  TAKE 1 TABLET BY MOUTH AT  BEDTIME [name], MD  Active Self  mupirocin cream (BACTROBAN) 2 % [id number]  Apply topically daily. Samuella Cota, MD  Active   naproxen (NAPROSYN) 500 MG tablet [id number]  Take 1 tablet (500 mg total) by mouth 2 (two) times daily as needed for moderate pain. Samuella Cota, MD  Active   nystatin-triamcinolone ointment [facility]) [id number]  Apply 1 application topically 2 (two) times daily as needed (applied to skin folds/groins/under breast skin irritation rash.). [name], MD  Active Self  OXYGEN [id number]  Inhale 2-3 L/min into the lungs See admin instructions. Inhale  2-3 L/min of oxygen into the lungs w/CPAP at bedtime and as needed for shortness of breath with "strenuous activity" during the day [provider]  Active Self  pantoprazole (PROTONIX) 40 MG tablet [id number]  TAKE 1 TABLET BY MOUTH  DAILY [name], MD  Active Self  PATADAY 0.2 % SOLN [id number]  Apply 1 drop to eye daily. [provider]  Active Self  polyethylene glycol (MIRALAX / GLYCOLAX) 17 g packet [id number]  Take 17 g by mouth daily as needed for mild constipation. Wellington Hampshire, PA-C  Active Self  pregabalin (LYRICA) 150 MG capsule [id number]  TAKE 1 CAPSULE BY MOUTH AT  BEDTIME [name],  MD  Active Self  QUEtiapine (SEROQUEL) 50 MG tablet [id number]  Take 1 tablet (50 mg total) by mouth at bedtime. [name], MD  Active   roflumilast (DALIRESP) 500 MCG TABS tablet [id number]  Take 1 tablet (500 mcg total) by mouth daily. [name], NP  Active Self  traZODone (DESYREL) 100 MG tablet [id number]  TAKE 1 TABLET BY MOUTH AT  BEDTIME [name], MD  Active Self  varenicline (CHANTIX CONTINUING MONTH PAK) 1 MG tablet [id number] Yes Take 1 tablet (1 mg total) by mouth 2 (two) times daily. [name], NP  Active   venlafaxine XR (EFFEXOR-XR) 150 MG 24 hr capsule [id number]  TAKE 1 CAPSULE BY MOUTH  DAILY WITH BREAKFAST [name], MD  Active Self  witch hazel-glycerin (TUCKS) pad [id number]  Apply topically as needed for itching. Samuella Cota, MD  Active             Patient Active Problem List   Diagnosis Date Noted   Depression 08/16/2020   Steroid-induced hyperglycemia 08/16/2020   COPD exacerbation ([place]) 08/07/2020   PTSD (post-traumatic stress disorder) 02/09/2020   Mood disorder in conditions classified elsewhere 02/09/2020   Generalized anxiety disorder 02/09/2020   Type II diabetes mellitus with manifestations ([place]) 10/29/2019   Coarse tremors 10/28/2019   Chronic rhinitis 10/21/2019   Allergic rhinitis 06/16/2019   Cervical spondylosis without myelopathy 12/18/2018   Hypertension    Encounter for general adult medical examination with abnormal findings 11/06/2018   Tobacco abuse 01/15/2018   Hypomagnesemia 12/29/2017   Pulmonary nodule 11/11/2017   Carotid artery disease ([place]) 11/05/2017   OSA on CPAP 08/07/2017   Gastroesophageal reflux disease 06/23/2017   Hypothyroidism due to acquired atrophy of thyroid 04/05/2017   Primary osteoarthritis involving multiple  joints 04/05/2017   Chronic constipation 02/10/2017   Iron deficiency anemia due to dietary causes 02/10/2017   Osteoarthritis 02/10/2017   Hyperlipidemia associated  with type 2 diabetes mellitus ([place]) 02/10/2017   Chronic respiratory failure with hypoxia ([place]) 12/16/2016   Essential hypertension 11/29/2016   Vocal cord dysfunction    COPD, severe ([place]) 09/24/2016   Anxiety 09/24/2016    Immunization History  Administered Date(s) Administered   Influenza,inj,Quad PF,6+ Mos 11/25/2016, 11/11/2017, 11/09/2018, 10/28/2019   PFIZER(Purple Top)SARS-COV-2 Vaccination 06/04/2019, 06/28/2019   Tdap 12/17/2018    Conditions to be addressed/monitored:  {USCCMDZASSESSMENTOPTIONS:23563}  There are no care plans that you recently modified to display for this patient.    Medication Assistance:  Symbicort - AZ&Me Daliresp - AZ&Me Combivent - BI Cares   Compliance/Adherence/Medication fill history: Care Gaps: Vaccines - Prevnar/PPSV, Shingrix, Covid booster Eye exam Pap smear Urine microalbumin (due 12/17/19)  Star-Rating Drugs: Atorvastatin - LF 09/10/20 x 90 ds   Patient's preferred pharmacy is:  [employer]  ([place], [place] [address] [place] [address] Phone: [phone] Fax: [phone]  [id number] [place]: [phone]: [phone] [facility] [place] [address]Phone: [phone]: [phone] Uses pill box? {Yes or If no, why not?:20788} Pt endorses ***% compliance  We discussed: {Pharmacy options:24294} Patient decided to: {US Pharmacy Plan:23885}  Care Plan and Follow Up Patient Decision:  {FOLLOWUP:24991}  Plan: {CM FOLLOW UP PTKZS:01093} ***    Current Barriers:  {pharmacybarriers:24917}  Pharmacist Clinical Goal(s):  Patient will {PHARMACYGOALCHOICES:24921} through collaboration with PharmD and  provider.   Interventions: 1:1 collaboration with CHoyt Koch MD regarding development and update of comprehensive plan of care as evidenced by provider attestation and co-signature Inter-disciplinary care team collaboration (see longitudinal plan of care) Comprehensive medication review performed; medication list updated in electronic medical record  COPD    Last spirometry score: FEV1 43% predicted, FEV1/FVC 0.47 Gold Grade: Gold 3 (FEV1 30-49%) Current COPD Classification:  D (high sx, >/=2 exacerbations/yr)  Patient has failed these meds in past: Atrovent, Duoneb, Xoponex, Dulera, Spiriva, Incruse Patient is currently controlled on the following medications:  Breztri 2 puffs BID (AZ&Me) Combivent 1 puff q6h prn (BI Cares) roflumilast 500 mcg daily (AZ & Me) montelukast 10 mg HS Albuterol HFA prn Albuterol neb PRN   Using maintenance inhaler regularly? Yes Frequency of rescue inhaler use:  daily   We discussed:  proper inhaler technique; pt reports more breathing difficulties in the AM, she is in the process of replacing her CPAP equipment due to a recall. She is also turning up her O2 to 3L overnight (normally 2 L during the day)   Plan Continue current medications  Follow up with pulmonology   Hypertension    BP goal < 130/80 Patient checks BP at home infrequently Patient home BP readings are ranging: n/a  Patient has failed these meds in the past: metoprolol Patient is currently controlled on the following medications:  Atenolol 25 mg daily, HCTZ 25 mg daily Bisoprolol 5 mg daily?   We discussed diet and exercise extensively, BP goals, importance of compliance with meds and monitoring BP at home.   Plan: Continue current medications  Hyperlipidemia    LDL goal < 100  Patient has failed these meds in past: n/a Patient is currently controlled on the following medications:  Atorvastatin 20 mg daily   We discussed:  Cholesterol goals, benefits of  statins, diet changes to improve triglycerides.   Plan: Continue current medications   Diabetes   A1c goal <7% Checking BG: {CHL HP Blood Glucose Monitoring [phone]}  Recent FBG Readings: *** Recent pre-meal BG readings: *** Recent 2hr PP BG readings:  *** Recent HS BG readings: ***  Patient has failed these meds in past: *** Patient is currently {CHL Controlled/[phone]} on the following medications: Metformin 500 mg BID  We discussed: {CHL HP Upstream Pharmacy discussion:[phone]}  Plan: Continue {CHL HP Upstream Pharmacy Plans:[phone]}  Chronic pain    Back pain; Cervicalgia; L Hip pain   Patient has failed these meds in past: gabapentin Patient is currently controlled on the following medications:  Tylenol 1000 mg q8h prn,  naproxen 500 mg BID prn,  Pregabalin 150 mg HS Salonpas patch Cyclobenzaprine 10 mg PRN   We discussed:  Pt reports pain is worse lately; she does still get relief from above regimen; recommended f/u with PCP if pain worsens   Plan: Continue current medications    Anxiety/depression    Patient has failed these meds in past: Abilify,  Patient is currently controlled on the following medications:  Bupropion XL 150 mg daily Buspirone 5 mg BID prn Venlafaxine 150 mg qAM  Quetiapine 50 mg HS Trazodone 100 mg HS   We discussed: PT reports worsening memory problems, she will stop in the middle of a task and forget what she was doing. She has discussed with her psychiatrist previously who increased quetiapine dose. Pt does not report any improvement since increasing dose. Encouraged her to follow up with psychiatrist regarding this issue.   Plan Continue current medications  Follow up with psychiatrist regarding memory issues   Tobacco Abuse   Patient smokes {Time to first cigarette:23873} Patient triggers include: {Smoking Triggers:23882} On a scale of 1-10, reports MOTIVATION to quit is *** On a scale of  1-10, reports CONFIDENCE in quitting is ***  Previous quit attempts included: *** Patient is currently {CHL Controlled/[phone]} on the following medications:  Chantix 1 mg   We discussed:  {Smoking Cessation Counseling:23883}  Plan: Continue {CHL HP Upstream Pharmacy OEVOJ:[phone]}  Patient Goals/Self-Care Activities Patient will:  - {pharmacypatientgoals:24919}

## 2020-10-25 ENCOUNTER — Ambulatory Visit: Payer: Medicare Other | Admitting: *Deleted

## 2020-10-25 ENCOUNTER — Telehealth (INDEPENDENT_AMBULATORY_CARE_PROVIDER_SITE_OTHER): Payer: Medicare Other | Admitting: Psychiatry

## 2020-10-25 ENCOUNTER — Encounter (HOSPITAL_COMMUNITY): Payer: Self-pay | Admitting: Psychiatry

## 2020-10-25 DIAGNOSIS — F32A Depression, unspecified: Secondary | ICD-10-CM

## 2020-10-25 DIAGNOSIS — E118 Type 2 diabetes mellitus with unspecified complications: Secondary | ICD-10-CM

## 2020-10-25 DIAGNOSIS — Z741 Need for assistance with personal care: Secondary | ICD-10-CM | POA: Diagnosis not present

## 2020-10-25 DIAGNOSIS — F411 Generalized anxiety disorder: Secondary | ICD-10-CM

## 2020-10-25 DIAGNOSIS — F0630 Mood disorder due to known physiological condition, unspecified: Secondary | ICD-10-CM

## 2020-10-25 DIAGNOSIS — I10 Essential (primary) hypertension: Secondary | ICD-10-CM | POA: Diagnosis not present

## 2020-10-25 DIAGNOSIS — J441 Chronic obstructive pulmonary disease with (acute) exacerbation: Secondary | ICD-10-CM

## 2020-10-25 DIAGNOSIS — J449 Chronic obstructive pulmonary disease, unspecified: Secondary | ICD-10-CM

## 2020-10-25 DIAGNOSIS — Z9981 Dependence on supplemental oxygen: Secondary | ICD-10-CM | POA: Diagnosis not present

## 2020-10-25 DIAGNOSIS — M6281 Muscle weakness (generalized): Secondary | ICD-10-CM | POA: Diagnosis not present

## 2020-10-25 DIAGNOSIS — F17210 Nicotine dependence, cigarettes, uncomplicated: Secondary | ICD-10-CM | POA: Diagnosis not present

## 2020-10-25 DIAGNOSIS — F4310 Post-traumatic stress disorder, unspecified: Secondary | ICD-10-CM

## 2020-10-25 DIAGNOSIS — E119 Type 2 diabetes mellitus without complications: Secondary | ICD-10-CM | POA: Diagnosis not present

## 2020-10-25 DIAGNOSIS — F419 Anxiety disorder, unspecified: Secondary | ICD-10-CM

## 2020-10-25 NOTE — Telephone Encounter (Signed)
ATC pt left vm to return call to office.

## 2020-10-25 NOTE — Chronic Care Management (AMB) (Signed)
Chronic Care Management    Clinical Social Work Note  10/25/2020 Name: Joan [name] MRN: [id number] DOB: 1960/06/05  Joan [name] is a 60 y.o. year old female who is a primary care patient of Sharlet Salina Real Cons, MD. The CCM team was consulted to assist the patient with chronic disease management and/or care coordination needs related to: [employer]  and Level of Care Concerns.   Engaged with patient by telephone for follow up visit in response to provider referral for social work chronic care management and care coordination services.   Consent to Services:  The patient was given information about Chronic Care Management services, agreed to services, and gave verbal consent prior to initiation of services.  Please see initial visit note for detailed documentation.   Patient agreed to services and consent obtained.   Assessment: Review of patient past medical history, allergies, medications, and health status, including review of relevant consultants reports was performed today as part of a comprehensive evaluation and provision of chronic care management and care coordination services.     SDOH (Social Determinants of Health) assessments and interventions performed:    Advanced Directives Status: Not addressed in this encounter.  CCM Care Plan  Allergies  Allergen Reactions   Keflex [Cephalexin] Rash and Other (See Comments)    Has tolerated amoxicillin since had keflex   Prednisone Other (See Comments)    "counter reacts"   Shellfish Allergy Shortness Of Breath, Nausea And Vomiting and Other (See Comments)    Stomach cramps   Tetracyclines & Related Anaphylaxis, Swelling and Other (See Comments)    Throat swelling requiring hospitalization   Dilaudid [Hydromorphone Hcl] Other (See Comments)    "Lethargy"   Incruse Ellipta [Umeclidinium Bromide] Nausea Only   Other    Tuberculin Tests Other (See Comments)    False postive    Outpatient Encounter  Medications as of 10/25/2020  Medication Sig   acyclovir (ZOVIRAX) 400 MG tablet TAKE 1 TABLET BY MOUTH  TWICE DAILY   albuterol (PROVENTIL) (2.5 MG/3ML) 0.083% nebulizer solution Take 3 mLs (2.5 mg total) by nebulization 2 (two) times daily. And every 6 hours as needed.  DX: J44.9   albuterol (VENTOLIN HFA) 108 (90 Base) MCG/ACT inhaler Inhale 2 puffs into the lungs every 6 (six) hours as needed for wheezing or shortness of breath.   atenolol (TENORMIN) 25 MG tablet Take 25 mg by mouth daily.   atorvastatin (LIPITOR) 20 MG tablet TAKE 1 TABLET BY MOUTH  DAILY   bisoprolol (ZEBETA) 5 MG tablet Take 1 tablet (5 mg total) by mouth daily.   Budeson-Glycopyrrol-Formoterol (BREZTRI AEROSPHERE) 160-9-4.8 MCG/ACT AERO Inhale 2 puffs into the lungs in the morning and at bedtime.   Budeson-Glycopyrrol-Formoterol (BREZTRI AEROSPHERE) 160-9-4.8 MCG/ACT AERO Inhale 2 puffs into the lungs in the morning and at bedtime.   buPROPion (WELLBUTRIN XL) 150 MG 24 hr tablet TAKE 1 TABLET BY MOUTH  DAILY   busPIRone (BUSPAR) 5 MG tablet Take 0.5-1 tablets (2.5-5 mg total) by mouth 2 (two) times daily as needed.   Camphor-Menthol-Methyl Sal 3.03-02-08 % PTCH Apply 1 patch topically daily as needed (pain).   hydrochlorothiazide (HYDRODIURIL) 25 MG tablet TAKE 1 TABLET BY MOUTH  DAILY   hydrocortisone (ANUSOL-HC) 25 MG suppository Place 1 suppository (25 mg total) rectally 2 (two) times daily.   Ipratropium-Albuterol (COMBIVENT RESPIMAT) 20-100 MCG/ACT AERS respimat Inhale 1 puff into the lungs every 6 (six) hours as needed for wheezing or shortness of breath.   levothyroxine (SYNTHROID)  25 MCG tablet TAKE 1 TABLET BY MOUTH  DAILY BEFORE BREAKFAST   Liniments (SALONPAS ARTHRITIS PAIN RELIEF EX) Apply 1 patch topically daily as needed (to painful sites- remove as directed).    metFORMIN (GLUCOPHAGE) 500 MG tablet Take 1 tablet (500 mg total) by mouth 2 (two) times daily with a meal.   montelukast (SINGULAIR) 10 MG tablet TAKE  1 TABLET BY MOUTH AT  BEDTIME   mupirocin cream (BACTROBAN) 2 % Apply topically daily.   naproxen (NAPROSYN) 500 MG tablet Take 1 tablet (500 mg total) by mouth 2 (two) times daily as needed for moderate pain.   nystatin-triamcinolone ointment (MYCOLOG) Apply 1 application topically 2 (two) times daily as needed (applied to skin folds/groins/under breast skin irritation rash.).   OXYGEN Inhale 2-3 L/min into the lungs See admin instructions. Inhale  2-3 L/min of oxygen into the lungs w/CPAP at bedtime and as needed for shortness of breath with "strenuous activity" during the day   pantoprazole (PROTONIX) 40 MG tablet TAKE 1 TABLET BY MOUTH  DAILY   PATADAY 0.2 % SOLN Apply 1 drop to eye daily.   polyethylene glycol (MIRALAX / GLYCOLAX) 17 g packet Take 17 g by mouth daily as needed for mild constipation.   pregabalin (LYRICA) 150 MG capsule TAKE 1 CAPSULE BY MOUTH AT  BEDTIME   QUEtiapine (SEROQUEL) 50 MG tablet TAKE 1 TABLET BY MOUTH AT  BEDTIME   roflumilast (DALIRESP) 500 MCG TABS tablet Take 1 tablet (500 mcg total) by mouth daily.   traZODone (DESYREL) 100 MG tablet TAKE 1 TABLET BY MOUTH AT  BEDTIME   varenicline (CHANTIX CONTINUING MONTH PAK) 1 MG tablet Take 1 tablet (1 mg total) by mouth 2 (two) times daily.   venlafaxine XR (EFFEXOR-XR) 150 MG 24 hr capsule TAKE 1 CAPSULE BY MOUTH  DAILY WITH BREAKFAST   witch hazel-glycerin (TUCKS) pad Apply topically as needed for itching.   No facility-administered encounter medications on file as of 10/25/2020.    Patient Active Problem List   Diagnosis Date Noted   Depression 08/16/2020   Steroid-induced hyperglycemia 08/16/2020   COPD exacerbation ([place]) 08/07/2020   PTSD (post-traumatic stress disorder) 02/09/2020   Mood disorder in conditions classified elsewhere 02/09/2020   Generalized anxiety disorder 02/09/2020   Type II diabetes mellitus with manifestations ([place]) 10/29/2019   Coarse tremors 10/28/2019   Chronic rhinitis 10/21/2019    Allergic rhinitis 06/16/2019   Cervical spondylosis without myelopathy 12/18/2018   Hypertension    Encounter for general adult medical examination with abnormal findings 11/06/2018   Tobacco abuse 01/15/2018   Hypomagnesemia 12/29/2017   Pulmonary nodule 11/11/2017   Carotid artery disease ([place]) 11/05/2017   OSA on CPAP 08/07/2017   Gastroesophageal reflux disease 06/23/2017   Hypothyroidism due to acquired atrophy of thyroid 04/05/2017   Primary osteoarthritis involving multiple joints 04/05/2017   Chronic constipation 02/10/2017   Iron deficiency anemia due to dietary causes 02/10/2017   Osteoarthritis 02/10/2017   Hyperlipidemia associated with type 2 diabetes mellitus ([place]) 02/10/2017   Chronic respiratory failure with hypoxia ([place]) 12/16/2016   Essential hypertension 11/29/2016   Vocal cord dysfunction    COPD, severe ([place]) 09/24/2016   Anxiety 09/24/2016    Conditions to be addressed/monitored: COPD, Anxiety, and Depression; Limited social support, Housing barriers, Level of care concerns, and Lack of essential utilities -  *  Care Plan : [place]  Updates made by Deirdre Peer, LCSW since 10/25/2020 12:00 AM  Problem: Need for patient-centered plan of care to promote improved mental health and overal quality of life.   Priority: High     Long-Range Goal: Develop self management plan to improve pt's mental health and overall health/quality of life.   Start Date: 05/09/2020  Expected End Date: 11/24/2020  This Visit's Progress: On track  Recent Progress: On track  Priority: High  Note:   Current barriers:   Patient in need of assistance with connecting to community resources for Limited social support, Housing barriers, Mental Health Concerns, Family and relationship dysfunction, and lacks knowledge of community resource: housing. Acknowledges deficits with meeting this unmet need Patient is unable to independently navigate community resource options  without care coordination support Patient with limited ability to complete applications for housing due to reported reading limitations Clinical Goals:  patient will work with SW to address concerns related to mental health support/needs patient will work with Care Guide  to address needs related to housing and other community needs as identified patient will follow up with Psychiatrist and Counselor to further discuss concerns and needs as directed by SW Clinical Interventions:  Pt  continues to seek placement and is awaiting progress notes from the [facility] agency to provide to [facility] along with the [facility] form  for review and consideration. CSW reminded pt she may not be eligible or approved by insurance for SNF level and to continue seeking other options for long term; such as ALF.  CSW provided pt with contact #'s for local non-profit as well as a national chain program that may be able to offer assistance with finding an appropriate setting (that she can afford).   Pt also plans to email the [facility] PT reports to me for assistance with forwarding to appropriate settings.   Collaboration with [name], MD regarding development and update of comprehensive plan of care as evidenced by provider attestation and co-signature Pt reports continued mental health support/sessions with her outpatient therapist and denies any current significant depression/anxiety. Pt denies SI/HI Inter-disciplinary care team collaboration (see longitudinal plan of care) Assessment of needs, barriers , agencies contacted, as well as how impacting  Review various resources, discussed options and provided patient information about Financial constraints related to housing, etc, Limited social support, Housing barriers, Mental Health Concerns , and Family and relationship dysfunction Collaborated with appropriate clinical care team members regarding patient needs Financial constraints related to housing, Limited social  support, Housing barriers, Mental Health Concerns , and Family and relationship dysfunction, COPD, Anxiety, and Depression Patient interviewed and appropriate assessments performed Referred patient to community resources care guide team for assistance with housing and other community support/resources as identified Provided mental health counseling with regard to pt concerns/needs (mental health diagnosis or concern) Discussed plans with patient for ongoing care management follow up and provided patient with direct contact information for care management team Other interventions provided: Motivational Interviewing Solution-Focused Strategies Brief CBT Emotional/Supportive Counseling Psychoeducation and/or Health Education Problem Solving Patient Goals:   - call 911 when I need emergency help - follow-up on any referrals for help I am given and seek housing options on my own in the local paper and community - think ahead to make sure my need does not become an emergency - make a note about what I need to have by the phone or take with me, like an identification card or social security number have a back-up plan - have a back-up plan - make a list of family or friends  that I can call  -follow up with counselor and Psychiatrist at scheduled appointment times to further discuss mental health concerns/needs for more frequent visits as discussed -make Neurologist aware of concerns related to memory issues, recent stuttering,etc -consider seeing a Nutritionist/RD for healthier diet/choices  Follow Up Plan: Appointment scheduled for SW follow up with client by phone on:  11/02/2020      Follow Up Plan: Appointment scheduled for SW follow up with client by phone on: 11/02/20      Eduard Clos MSW, [place] Licensed Clinical Social Worker [place] [phone]

## 2020-10-25 NOTE — Progress Notes (Signed)
[place] Follow up visit  Patient Identification: Avlynn Rahaman MRN:  [id number] Date of Evaluation:  10/25/2020 Referral Source:Therapist [name] Chief Complaint:    depression follow up  Visit Diagnosis:    ICD-10-CM   1. Mood disorder in conditions classified elsewhere  F06.30     2. Generalized anxiety disorder  F41.1     3. PTSD (post-traumatic stress disorder)  F43.10     Virtual Visit via Video Note  I connected with [name] on 10/25/20 at  1:00 PM EDT by a video enabled telemedicine application and verified that I am speaking with the correct person using two identifiers.  Location: Patient: Home Provider: home office   I discussed the limitations of evaluation and management by telemedicine and the availability of in person appointments. The patient expressed understanding and agreed to proceed.     I discussed the assessment and treatment plan with the patient. The patient was provided an opportunity to ask questions and all were answered. The patient agreed with the plan and demonstrated an understanding of the instructions.   The patient was advised to call back or seek an in-person evaluation if the symptoms worsen or if the condition fails to improve as anticipated.  I provided 15  minutes of non-face-to-face time during this encounter.        History of Present Illness: Patient is a 60  years old currently widow Caucasian female initially referred by her therapist for management of mood symptoms, depression possible PTSD and anxiety  Patient was in rehab after being admitted for COPD exacerbation   Was on xanax and now tapered off to buspar, she feels xanax was helpful but understands its risk Tyring to look for assisted living as difficult being with brother and her medical co morbidity with oxygen  Tries to keep busy but gets tired and on oxygen Has a Education officer, museum helping out to give her info and referrals  Aggravating factor;  difficult childhood, grief, difficult relationship with middle kid, hip replacement medical comorbidity Modifying factor: siblings  Duration adult life      Past Psychiatric History: depression, drug use  Previous Psychotropic Medications: Yes   Substance Abuse History in the last 12 months:  No.  Consequences of Substance Abuse: history of extensive drug use uptil 8 years ago and have been in rehab programs  Past Medical History:  Past Medical History:  Diagnosis Date   Acute respiratory failure with hypoxia ([place])    Alcoholism and drug addiction in family    Anxiety    Appendicitis    Arthritis    "knees, hips, lower back" (09/25/2016)   Asthma    Bipolar disorder ([place])    Chronic bronchitis ([place])    "all the time" (09/25/2016)   Chronic leg pain    RLE   Chronic lower back pain    COPD (chronic obstructive pulmonary disease) ([place])    Depression    Diabetes mellitus without complication ([place])    Diabetic neuropathy ([place])    Early cataracts, bilateral    Emphysema of lung ([place])    Generalized anxiety disorder 02/09/2020   GERD (gastroesophageal reflux disease)    Headache    Hepatitis C    "treated back in 2001-2002"   High cholesterol    Hypertension    Hypothyroidism    Incarcerated ventral hernia 04/04/2017   Mood disorder in conditions classified elsewhere 02/09/2020   On home oxygen therapy    "2L; at nighttime" (04/04/2017)  OSA on CPAP    CPAP with Oxygen 2 liters   Paranoid (HCC)    Pneumonia    "all the time" (09/25/2016)   Positive PPD    with negative chest x ray   PTSD (post-traumatic stress disorder) 02/09/2020   Tachycardia    patient reports with exertion ; denies any other conditions    Thyroid disease    Wears glasses    Wears partial dentures     Past Surgical History:  Procedure Laterality Date   BACK SURGERY     BIOPSY  11/24/2018   Procedure: BIOPSY;  Surgeon: Thornton Park, MD;  [facility];  Service: Gastroenterology;;    CARDIAC CATHETERIZATION     COLONOSCOPY     COLONOSCOPY WITH PROPOFOL N/A 11/24/2018   Procedure: COLONOSCOPY WITH PROPOFOL;  Surgeon: Thornton Park, MD;  [facility];  Service: Gastroenterology;  Laterality: N/A;   CYST EXCISION     removal of cyst in sinuses   DILATION AND CURETTAGE OF UTERUS     ESOPHAGOGASTRODUODENOSCOPY (EGD) WITH PROPOFOL N/A 11/24/2018   Procedure: ESOPHAGOGASTRODUODENOSCOPY (EGD) WITH PROPOFOL;  Surgeon: Thornton Park, MD;  [facility];  Service: Gastroenterology;  Laterality: N/A;   FOOT SURGERY Bilateral    bone removed from 4th and 5 th toes and put in pin then took pin out   [place]  ~ 2014/2015   "removed mesh & infection"   [place] N/A 04/04/2017   Procedure: LAPAROSCOPIC INCISIONAL HERNIA REPAIR WITH MESH;  Surgeon: Fanny Skates, MD;  Location: [facility];  Service: General;  Laterality: N/A;   LACERATION REPAIR Right    repair wrist artery from laceration from ice skate   LAPAROSCOPIC APPENDECTOMY N/A 03/31/2019   Procedure: APPENDECTOMY LAPAROSCOPIC;  Surgeon: Ralene Ok, MD;  [facility];  Service: General;  Laterality: N/A;   LAPAROSCOPIC APPENDECTOMY N/A 07/09/2019   Procedure: APPENDECTOMY LAPAROSCOPIC;  Surgeon: Ralene Ok, MD;  Location: [facility];  Service: General;  Laterality: N/A;   LAPAROSCOPIC INCISIONAL / UMBILICAL / [place]  04/04/2017   LAPAROSCOPIC INCISIONAL HERNIA REPAIR WITH MESH   LIPOMA EXCISION Right    fattty lipoma in neck   NASAL SEPTUM SURGERY     POSTERIOR LUMBAR FUSION  2015/2016 X 2   "added rods and screws"   SINOSCOPY     TOTAL HIP ARTHROPLASTY Right 02/05/2017   Procedure: RIGHT TOTAL HIP ARTHROPLASTY;  Surgeon: [name], MD;  [facility];  Service: Orthopedics;  Laterality: Right;   TOTAL HIP ARTHROPLASTY Left 06/18/2017   Procedure: LEFT TOTAL HIP ARTHROPLASTY;  Surgeon: [name], MD;  [facility];  Service: Orthopedics;  Laterality: Left;   TOTAL HIP REVISION Left 01/26/2019   Procedure: TOTAL HIP REVISION;  Surgeon: Paralee Cancel, MD;  [facility];  Service: Orthopedics;  Laterality: Left;  2 hrs   TUBAL LIGATION     UMBILICAL HERNIA REPAIR  ~ 2013   w/mesh   [facility] ENDOSCOPY      Family Psychiatric History: FAther : alcohol use  Family History:  Family History  Problem Relation Age of Onset   Diabetes Mother    Hypertension Mother    Hypertension Father    Cancer Father        lung    Social History:   Social History   Socioeconomic History   Marital status: Widowed    Spouse name: Not on file   Number  of children: 3   Years of education: Not on file   Highest education level: Not on file  Occupational History   Not on file  Tobacco Use   Smoking status: Every Day    Packs/day: 0.50    Years: 44.00    Pack years: 22.00    Types: Cigarettes   Smokeless tobacco: Never   Tobacco comments:    down to 0.5ppd 06/30/20/jm  Vaping Use   Vaping Use: Never used  Substance and Sexual Activity   Alcohol use: Not Currently   Drug use: Not Currently    Types: "Crack" cocaine    Comment:  "nothing since 01/12/2012"   Sexual activity: Not Currently    Partners: Male  Other Topics Concern   Not on file  Social History Narrative   Not on file   Social Determinants of Health   Financial Resource Strain: High Risk   Difficulty of Paying Living Expenses: Hard  Food Insecurity: Food Insecurity Present   Worried About Running Out of Food in the Last Year: Sometimes true   Ran Out of Food in the Last Year: Sometimes true  Transportation Needs: No Transportation Needs   Lack of Transportation (Medical): No   Lack of Transportation (Non-Medical): No  Physical Activity: Not on file  Stress: Stress Concern Present   Feeling of Stress : Very much  Social Connections: Not on file      Allergies:   Allergies  Allergen Reactions   Keflex [Cephalexin] Rash  and Other (See Comments)    Has tolerated amoxicillin since had keflex   Prednisone Other (See Comments)    "counter reacts"   Shellfish Allergy Shortness Of Breath, Nausea And Vomiting and Other (See Comments)    Stomach cramps   Tetracyclines & Related Anaphylaxis, Swelling and Other (See Comments)    Throat swelling requiring hospitalization   Dilaudid [Hydromorphone Hcl] Other (See Comments)    "Lethargy"   Incruse Ellipta [Umeclidinium Bromide] Nausea Only   Other    Tuberculin Tests Other (See Comments)    False postive    Metabolic Disorder Labs: Lab Results  Component Value Date   HGBA1C 6.8 (H) 08/09/2020   MPG 148 08/09/2020   MPG 151 08/08/2020   No results found for: PROLACTIN Lab Results  Component Value Date   CHOL 141 03/07/2020   TRIG 143.0 03/07/2020   HDL 54.30 03/07/2020   CHOLHDL 3 03/07/2020   VLDL 28.6 03/07/2020   LDLCALC 58 03/07/2020   LDLCALC 62 08/19/2017   Lab Results  Component Value Date   TSH 0.89 10/28/2019    Therapeutic Level Labs: No results found for: LITHIUM No results found for: CBMZ No results found for: VALPROATE  Current Medications: Current Outpatient Medications  Medication Sig Dispense Refill   acyclovir (ZOVIRAX) 400 MG tablet TAKE 1 TABLET BY MOUTH  TWICE DAILY 180 tablet 1   albuterol (PROVENTIL) (2.5 MG/3ML) 0.083% nebulizer solution Take 3 mLs (2.5 mg total) by nebulization 2 (two) times daily. And every 6 hours as needed.  DX: J44.9 360 mL 3   albuterol (VENTOLIN HFA) 108 (90 Base) MCG/ACT inhaler Inhale 2 puffs into the lungs every 6 (six) hours as needed for wheezing or shortness of breath. 8 g 6   atenolol (TENORMIN) 25 MG tablet Take 25 mg by mouth daily.     atorvastatin (LIPITOR) 20 MG tablet TAKE 1 TABLET BY MOUTH  DAILY 90 tablet 3   bisoprolol (ZEBETA) 5 MG tablet Take 1 tablet (  5 mg total) by mouth daily.     Budeson-Glycopyrrol-Formoterol (BREZTRI AEROSPHERE) 160-9-4.8 MCG/ACT AERO Inhale 2 puffs into  the lungs in the morning and at bedtime. 5.9 g 0   Budeson-Glycopyrrol-Formoterol (BREZTRI AEROSPHERE) 160-9-4.8 MCG/ACT AERO Inhale 2 puffs into the lungs in the morning and at bedtime. 4.8 g 0   buPROPion (WELLBUTRIN XL) 150 MG 24 hr tablet TAKE 1 TABLET BY MOUTH  DAILY 30 tablet 1   busPIRone (BUSPAR) 5 MG tablet Take 0.5-1 tablets (2.5-5 mg total) by mouth 2 (two) times daily as needed. 60 tablet 0   Camphor-Menthol-Methyl Sal 3.03-02-08 % PTCH Apply 1 patch topically daily as needed (pain).     hydrochlorothiazide (HYDRODIURIL) 25 MG tablet TAKE 1 TABLET BY MOUTH  DAILY 90 tablet 3   hydrocortisone (ANUSOL-HC) 25 MG suppository Place 1 suppository (25 mg total) rectally 2 (two) times daily.     Ipratropium-Albuterol (COMBIVENT RESPIMAT) 20-100 MCG/ACT AERS respimat Inhale 1 puff into the lungs every 6 (six) hours as needed for wheezing or shortness of breath.     levothyroxine (SYNTHROID) 25 MCG tablet TAKE 1 TABLET BY MOUTH  DAILY BEFORE BREAKFAST 90 tablet 3   Liniments (SALONPAS ARTHRITIS PAIN RELIEF EX) Apply 1 patch topically daily as needed (to painful sites- remove as directed).      metFORMIN (GLUCOPHAGE) 500 MG tablet Take 1 tablet (500 mg total) by mouth 2 (two) times daily with a meal.     montelukast (SINGULAIR) 10 MG tablet TAKE 1 TABLET BY MOUTH AT  BEDTIME 90 tablet 3   mupirocin cream (BACTROBAN) 2 % Apply topically daily.     naproxen (NAPROSYN) 500 MG tablet Take 1 tablet (500 mg total) by mouth 2 (two) times daily as needed for moderate pain.     nystatin-triamcinolone ointment (MYCOLOG) Apply 1 application topically 2 (two) times daily as needed (applied to skin folds/groins/under breast skin irritation rash.). 100 g 3   OXYGEN Inhale 2-3 L/min into the lungs See admin instructions. Inhale  2-3 L/min of oxygen into the lungs w/CPAP at bedtime and as needed for shortness of breath with "strenuous activity" during the day     pantoprazole (PROTONIX) 40 MG tablet TAKE 1 TABLET BY  MOUTH  DAILY 90 tablet 3   PATADAY 0.2 % SOLN Apply 1 drop to eye daily.     polyethylene glycol (MIRALAX / GLYCOLAX) 17 g packet Take 17 g by mouth daily as needed for mild constipation. 28 packet 0   pregabalin (LYRICA) 150 MG capsule TAKE 1 CAPSULE BY MOUTH AT  BEDTIME 30 capsule 3   QUEtiapine (SEROQUEL) 50 MG tablet TAKE 1 TABLET BY MOUTH AT  BEDTIME 30 tablet 1   roflumilast (DALIRESP) 500 MCG TABS tablet Take 1 tablet (500 mcg total) by mouth daily. 90 tablet 3   traZODone (DESYREL) 100 MG tablet TAKE 1 TABLET BY MOUTH AT  BEDTIME 90 tablet 3   varenicline (CHANTIX CONTINUING MONTH PAK) 1 MG tablet Take 1 tablet (1 mg total) by mouth 2 (two) times daily. 1 tablet 0   venlafaxine XR (EFFEXOR-XR) 150 MG 24 hr capsule TAKE 1 CAPSULE BY MOUTH  DAILY WITH BREAKFAST 90 capsule 3   witch hazel-glycerin (TUCKS) pad Apply topically as needed for itching.     No current facility-administered medications for this visit.     Psychiatric Specialty Exam: Review of Systems  Cardiovascular:  Negative for chest pain.  Psychiatric/Behavioral:  Negative for substance abuse and suicidal ideas.  There were no vitals taken for this visit.There is no height or weight on file to calculate BMI.  General Appearance:casual  Eye Contact: fair  Speech:  Slow  Volume:  Decreased  Mood:f subdued  Affect:  Congruent  Thought Process:  Goal Directed  Orientation:  Full (Time, Place, and Person)  Thought Content: linear  Suicidal Thoughts:  No  Homicidal Thoughts:  No  Memory:  Immediate;   Fair Recent;   Fair  Judgement:  Fair  Insight:  Shallow  Psychomotor Activity:  Decreased  Concentration:  Concentration: Fair and Attention Span: Fair  Recall:  [employer] [place]: Fair  Akathisia:  No  Handed:    AIMS (if indicated): no involuntary movements  Assets:  Desire for Improvement Leisure Time Social Support  ADL's: limited due to recent surgery  Cognition: WNL  Sleep:    variable   Screenings: Mini-Mental    Flowsheet Row Clinical Support from 04/06/2018 in [facility]  Total Score (max 30 points ) 29      PHQ2-9    [place] Visit from 09/19/2020 in [facility] at [facility] Management from 05/30/2020 in [place] at [facility] Management from 05/05/2020 in [place] [facility] from 05/24/2019 in [facility] at [facility] Patient Outreach Telephone from 12/18/2018 in [facility]  PHQ-2 Total Score '2 2 2 2 3  '$ PHQ-9 Total Score 7 -- 9 13 --      Flowsheet Row Video Visit from 10/25/2020 in [facility] Video Visit from 08/24/2020 in [facility] ED to Hosp-Admission (Discharged) from 08/07/2020 in [facility] No Risk No Risk No Risk       Assessment and Plan: as follows Prior documentation reviewed   Mood disorder unspecified: relavant to multiple medical concerns and past history of using drugs, alcohol, medical comorbidities including her COPD and compliance with medications Subdued, taking wellbutrin , more circumstancial depression, trying to get into assisted living, provided supportive therapy, continue seroquel, effexor Reviewed meds and side effects , encouraged to divert from negative thoughts   She is in communication with Education officer, museum and looking for assisted living, which would help her PTSD; did not endorse much concern related to that considering her above psychosocial stressors   Follow-up in 2 months or earlier if needed [name], MD 8/31/20221:15 PM

## 2020-10-25 NOTE — Telephone Encounter (Signed)
Joan Spatz, NP to Dierdre Highman, RN     5:47 PM  Send in Doxycycline 100 mg BID x 7 days  She has an allergy noted to prednisone, so that is not an option  Treating as a COPD exacerbation  Tell her to continue Breztri and albuterol as she has been doing.  Use albuterol nebs as needed.  Seek emergency care if she gets worse not better.  She must have a follow up in office visit to ensure she is better within 2 weeks, or telephone rx  in future will not be sent in.  Thanks   Pt called back and I spoke with her and gave her [name]'s recs. She declined doxy, stating that she does not feel like this is needed since she is at her normal baseline. She states she does not like the fact that the nurse called [place] on her behalf and gave [place] symptoms bc she states she feels fine and does not need anything like abx. She has appt for f/u 11/20/20 and will keep this appt and call sooner if needed.

## 2020-10-25 NOTE — Patient Instructions (Signed)
Visit Information  PATIENT GOALS:  Goals Addressed   None     The patient verbalized understanding of instructions, educational materials, and care plan provided today and declined offer to receive copy of patient instructions, educational materials, and care plan.   Telephone follow up appointment with care management team member scheduled for:11/02/20  Eduard Clos MSW, LCSW Licensed Clinical Social Worker [place] [phone]

## 2020-10-26 ENCOUNTER — Ambulatory Visit (INDEPENDENT_AMBULATORY_CARE_PROVIDER_SITE_OTHER): Payer: Medicare Other | Admitting: Licensed Clinical Social Worker

## 2020-10-26 DIAGNOSIS — R443 Hallucinations, unspecified: Secondary | ICD-10-CM

## 2020-10-26 DIAGNOSIS — F0630 Mood disorder due to known physiological condition, unspecified: Secondary | ICD-10-CM

## 2020-10-26 DIAGNOSIS — F4310 Post-traumatic stress disorder, unspecified: Secondary | ICD-10-CM | POA: Diagnosis not present

## 2020-10-26 DIAGNOSIS — F411 Generalized anxiety disorder: Secondary | ICD-10-CM | POA: Diagnosis not present

## 2020-10-26 NOTE — Progress Notes (Signed)
Virtual Visit via Video Note  I connected with Joan [name] on 10/26/20 at  1:00 PM EDT by a video enabled telemedicine application and verified that I am speaking with the correct person using two identifiers.  Location: Patient: home Provider: home office   I discussed the limitations of evaluation and management by telemedicine and the availability of in person appointments. The patient expressed understanding and agreed to proceed.   I discussed the assessment and treatment plan with the patient. The patient was provided an opportunity to ask questions and all were answered. The patient agreed with the plan and demonstrated an understanding of the instructions.   The patient was advised to call back or seek an in-person evaluation if the symptoms worsen or if the condition fails to improve as anticipated.  I provided 52 minutes of non-face-to-face time during this encounter.  THERAPIST PROGRESS NOTE  Session Time: 1:00 PM to 1:52 PM  Participation Level: Active  Behavioral Response: CasualAlertDepressed  Type of Therapy: Individual Therapy  Treatment Goals addressed:  PTSD, coping, wants to work on where she is and work through her childhood Interventions: Solution Focused, Strength-based, Supportive, and Other: coping  Summary: Joan [name] is a 60 y.o. female who presents with told her doctor, Dr. Sharlet Salina that she can't live independently anymore. The doctor wrote a letter to say not safe for patient to live in environment. Looking at [employer]. Have a lot of requirements.  Therapist pointed out even with requirements that given difficulty of finding something that would be the least of concerns if there was a place for her to go.  Patient relates missed out on the bed because Dr. Did not fill out FL2 form in time.  Plan to see Dr. For appointment on September 6 that patient has arranged to let Dr. Gwyndolyn Kaufman of the importance and urgency of filling out the form among  other things.  Dr. De Nurse patient reports also said need help with daily routines. Can't do activities of daily living on her own. OT came once a week and plan to discharge her from OT even though patient relates can only walk 50 feet.  Nurse coming back but have to wait a couple of weeks to restart and she came every day. Dr. Sharlet Salina needs to get the paperwork done and part of plan is also to be on Medicaid and that will enable her to get into assisted living. It has been tough not feeling good going to the bathroom non stop since Monday. Describes that she felt like "hickies" over her body not so bad. Thought it was related to inhaler or Buspar. Also described it like when at beach and sand over her feel like 24-7 and on top of that was getting hot.  Stopped both went back to old inhaler but will let doctors know. Patient describes her attitude at this point she  doesn't care about not smoking, at this point how she feels.  Describes getting off both of these things she is feeling better than what feeling. Her eyes feel like having an allergy attack thought. . Anxiety comes and goes depressed because of what is going on. Will feel better in a place where doctors and nurses. Here doesn't happen. Doesn't know if therapy helping doesn't know if coming and going. Can't think straight anymore. Doesn't want to think. Tried. "One foot in grave and one hanging on." Struggling to do things on own. Bedroom look like train ran through never been like that  doesn't care not get upset if bed not made. Right now not a question she can answer about therapy but wants to keep it the way it is. September 7 want to be in a new facility.  Therapist pointed out setting a goal like that we will make it happen sooner patient relates for her double the work because doctor not doing what she needs to be doing.  Need doctor to fill paperwork that says not safe at home, needs to be in a facility and needs intense therapy only can walk 50  foot.  Therapist explored what her day today looks like and patient relates watches reality television because she doesn't have a life told her daughter.  Spends time on the porch in her room limited people to talk to but does talk to visiting nurse, her doctors this therapist. Her brother thinks she needs to talk about the past, patient says her past history doesn't bother her. They point out her actions are related to what happened in the past, patient said not because of past it is her. She relates maybe she is just an "old bitch" leave it where it is. Not going to [place] doesn't know anybody in area who uses drugs and alcohol and doesn't want to know anybody.  Recognizes this is a trigger.  Shared that after a year clear shared her story and attributes sobriety to [name] and not AA. Therapist pointed out that behind the [place] principles also relates to a being higher than ourselves that helps people to stay clean.  Patient relates that AA is not for everybody and therapist agreed  Therapist reviewed symptoms, facilitated expression of thoughts and feelings assess this is particularly important because patient is dealing with major stress of trying to get into a facility because of health issues not able to manage at home becomes a safety issue.  Therapist validated patient on how she felt and also provided positive feedback for patient steps she is taking to solve the situation that include seeing her doctor next week and letting the doctor know the urgency and the need of doctor to be involved in finding her place.  Patient does seem to be able to engage in activities during the day which is positive particularly with her physical issues.  Talked about one of the goals for sessions is for patient to talk about the past explored this with patient and she relates at this point she does not feel like needing to explore past does not see the negative impact on her therapist also noted at this point with health issues it is  a priority.  Explained to patient trauma can impact how we see ourselves in the world and trauma work is seeing where that happens in challenging changing unhelpful perspectives as we work together we may find areas where she does feel it is an impact and then we can address it.  Noted patient's sense of humor is a strength.  Noted patient's determination and getting things into her own hands to solve her issues as a strength.  Therapist provided active listening open questions supportive interventions.  Noted this role as a support as helpful for patient given that she is home and has limited contact with people. Suicidal/Homicidal: No  Plan: Return again in  2 weeks.2.  Therapist processed feelings with patient related to major stressors to help with her working through feelings as well as gaining insight to helpful coping  Diagnosis: Axis I: PTSD, hallucinations, GAD, Mood Disorder  in conditions classified elsewhere    Axis II: No diagnosis    [name], LCSW 10/26/2020

## 2020-10-27 DIAGNOSIS — Z741 Need for assistance with personal care: Secondary | ICD-10-CM | POA: Diagnosis not present

## 2020-10-27 DIAGNOSIS — J441 Chronic obstructive pulmonary disease with (acute) exacerbation: Secondary | ICD-10-CM | POA: Diagnosis not present

## 2020-10-27 DIAGNOSIS — E119 Type 2 diabetes mellitus without complications: Secondary | ICD-10-CM | POA: Diagnosis not present

## 2020-10-27 DIAGNOSIS — M6281 Muscle weakness (generalized): Secondary | ICD-10-CM | POA: Diagnosis not present

## 2020-10-27 DIAGNOSIS — Z9981 Dependence on supplemental oxygen: Secondary | ICD-10-CM | POA: Diagnosis not present

## 2020-10-27 DIAGNOSIS — F17210 Nicotine dependence, cigarettes, uncomplicated: Secondary | ICD-10-CM | POA: Diagnosis not present

## 2020-10-31 ENCOUNTER — Other Ambulatory Visit: Payer: Self-pay

## 2020-10-31 ENCOUNTER — Encounter: Payer: Self-pay | Admitting: Internal Medicine

## 2020-10-31 ENCOUNTER — Ambulatory Visit (INDEPENDENT_AMBULATORY_CARE_PROVIDER_SITE_OTHER): Payer: Medicare Other | Admitting: Internal Medicine

## 2020-10-31 DIAGNOSIS — H2511 Age-related nuclear cataract, right eye: Secondary | ICD-10-CM | POA: Diagnosis not present

## 2020-10-31 DIAGNOSIS — J9611 Chronic respiratory failure with hypoxia: Secondary | ICD-10-CM

## 2020-10-31 DIAGNOSIS — E1165 Type 2 diabetes mellitus with hyperglycemia: Secondary | ICD-10-CM | POA: Diagnosis not present

## 2020-10-31 DIAGNOSIS — H2512 Age-related nuclear cataract, left eye: Secondary | ICD-10-CM | POA: Diagnosis not present

## 2020-10-31 DIAGNOSIS — J449 Chronic obstructive pulmonary disease, unspecified: Secondary | ICD-10-CM | POA: Diagnosis not present

## 2020-10-31 DIAGNOSIS — J410 Simple chronic bronchitis: Secondary | ICD-10-CM | POA: Diagnosis not present

## 2020-10-31 DIAGNOSIS — H538 Other visual disturbances: Secondary | ICD-10-CM | POA: Diagnosis not present

## 2020-10-31 NOTE — Progress Notes (Signed)
   Subjective:   Patient ID: Joan [name], female    DOB: 03-10-1960, 60 y.o.   MRN: [id number]  HPI The patient is a 60 YO female coming in for concerns about inadequacy of PT/OT in her home. They are only coming 1 day per week and she does not think that is enough. She cannot care for herself or her household. She is looking into ALF facility and now wants to go to SNF. Working with a Education officer, museum on housing. She is unsure if this will be soon enough and may want to go to hospital for breathing as a way to get to SNF. Her breathing is a large struggle with ADLs but overall is stable currently. She is still smoking.   Review of Systems  Constitutional:  Positive for activity change and fatigue. Negative for appetite change, chills, fever and unexpected weight change.  Respiratory:  Positive for cough and shortness of breath.   Cardiovascular: Negative.   Gastrointestinal: Negative.   Musculoskeletal:  Positive for arthralgias, back pain and myalgias. Negative for gait problem and joint swelling.  Skin: Negative.   Neurological: Negative.   Psychiatric/Behavioral:  Positive for decreased concentration, dysphoric mood and sleep disturbance. The patient is nervous/anxious.    Objective:  Physical Exam Constitutional:      Appearance: She is well-developed.     Comments: Chronically ill appearing  HENT:     Head: Normocephalic and atraumatic.  Cardiovascular:     Rate and Rhythm: Normal rate and regular rhythm.  Pulmonary:     Effort: Pulmonary effort is normal. No respiratory distress.     Breath sounds: Wheezing present. No rales.     Comments: Stable lung exam from baseline, oxygen in place Abdominal:     General: Bowel sounds are normal. There is no distension.     Palpations: Abdomen is soft.     Tenderness: There is no abdominal tenderness. There is no rebound.  Musculoskeletal:     Cervical back: Normal range of motion.  Skin:    General: Skin is warm and dry.   Neurological:     Mental Status: She is alert and oriented to person, place, and time.     Coordination: Coordination abnormal.     Comments: wheelchair    Vitals:   10/31/20 1454  BP: 122/90  Pulse: 89  Temp: 98.3 F (36.8 C)  TempSrc: Oral  SpO2: 90%  Height: '5\' 8"'$  (1.727 m)    This visit occurred during the SARS-CoV-2 public health emergency.  Safety protocols were in place, including screening questions prior to the visit, additional usage of staff PPE, and extensive cleaning of exam room while observing appropriate contact time as indicated for disinfecting solutions.   Assessment & Plan:  Visit time 25 minutes in face to face communication with patient and coordination of care, additional 10 minutes spent in record review, coordination or care, ordering tests, communicating/referring to other healthcare professionals, documenting in medical records all on the same day of the visit for total time 35 minutes spent on the visit.

## 2020-10-31 NOTE — Patient Instructions (Signed)
I will check in with our social worker Marcie Bal to see if she can help.

## 2020-11-01 DIAGNOSIS — E119 Type 2 diabetes mellitus without complications: Secondary | ICD-10-CM | POA: Diagnosis not present

## 2020-11-01 DIAGNOSIS — Z741 Need for assistance with personal care: Secondary | ICD-10-CM | POA: Diagnosis not present

## 2020-11-01 DIAGNOSIS — F17210 Nicotine dependence, cigarettes, uncomplicated: Secondary | ICD-10-CM | POA: Diagnosis not present

## 2020-11-01 DIAGNOSIS — Z9981 Dependence on supplemental oxygen: Secondary | ICD-10-CM | POA: Diagnosis not present

## 2020-11-01 DIAGNOSIS — M6281 Muscle weakness (generalized): Secondary | ICD-10-CM | POA: Diagnosis not present

## 2020-11-01 DIAGNOSIS — J441 Chronic obstructive pulmonary disease with (acute) exacerbation: Secondary | ICD-10-CM | POA: Diagnosis not present

## 2020-11-01 NOTE — Assessment & Plan Note (Signed)
Advised to quit and she feels unable to make attempt currently.

## 2020-11-01 NOTE — Assessment & Plan Note (Signed)
Overall stable today. Still using oxygen 24/7.

## 2020-11-01 NOTE — Assessment & Plan Note (Signed)
Reminded that smoking is causing gradual worsening of her lung disease. She has quit in the past and she is not motivated to try right now due to stress over living situation.

## 2020-11-02 ENCOUNTER — Telehealth: Payer: Self-pay | Admitting: *Deleted

## 2020-11-02 NOTE — Telephone Encounter (Cosign Needed)
CSW spoke with pt by phone on 11/01/20 and reminded and explained to her what steps she needs to take in order to get the [facility] PT notes released to her and then to me. CSW explained to pt without PT notes showing she needs SNF level of care in order for Insurance determination to be made.  CSW reminded pt that she may not need that acuity of care.   Pt to email info to CSW once received.   Eduard Clos MSW, LCSW Licensed Clinical Social Worker [facility] [place] [phone]

## 2020-11-02 NOTE — Telephone Encounter (Signed)
See below

## 2020-11-02 NOTE — Telephone Encounter (Signed)
Charisse called in  [place] there was a mix-up w/ patient in their office  Calling in requesting verbal orders to reevaluate patient & to extend [place]

## 2020-11-03 DIAGNOSIS — Z741 Need for assistance with personal care: Secondary | ICD-10-CM | POA: Diagnosis not present

## 2020-11-03 DIAGNOSIS — M6281 Muscle weakness (generalized): Secondary | ICD-10-CM | POA: Diagnosis not present

## 2020-11-03 DIAGNOSIS — J441 Chronic obstructive pulmonary disease with (acute) exacerbation: Secondary | ICD-10-CM | POA: Diagnosis not present

## 2020-11-03 DIAGNOSIS — Z9981 Dependence on supplemental oxygen: Secondary | ICD-10-CM | POA: Diagnosis not present

## 2020-11-03 DIAGNOSIS — E119 Type 2 diabetes mellitus without complications: Secondary | ICD-10-CM | POA: Diagnosis not present

## 2020-11-03 DIAGNOSIS — F17210 Nicotine dependence, cigarettes, uncomplicated: Secondary | ICD-10-CM | POA: Diagnosis not present

## 2020-11-03 NOTE — Telephone Encounter (Signed)
Okay for verbals °

## 2020-11-06 ENCOUNTER — Telehealth: Payer: Self-pay

## 2020-11-06 DIAGNOSIS — Z741 Need for assistance with personal care: Secondary | ICD-10-CM | POA: Diagnosis not present

## 2020-11-06 DIAGNOSIS — F17210 Nicotine dependence, cigarettes, uncomplicated: Secondary | ICD-10-CM | POA: Diagnosis not present

## 2020-11-06 DIAGNOSIS — Z9981 Dependence on supplemental oxygen: Secondary | ICD-10-CM | POA: Diagnosis not present

## 2020-11-06 DIAGNOSIS — E119 Type 2 diabetes mellitus without complications: Secondary | ICD-10-CM | POA: Diagnosis not present

## 2020-11-06 DIAGNOSIS — J441 Chronic obstructive pulmonary disease with (acute) exacerbation: Secondary | ICD-10-CM | POA: Diagnosis not present

## 2020-11-06 DIAGNOSIS — M6281 Muscle weakness (generalized): Secondary | ICD-10-CM | POA: Diagnosis not present

## 2020-11-06 NOTE — Telephone Encounter (Signed)
Please advise Charri with [facility] called asking for verbal orders of [facility] PT 1w x2 2w x2.  Jerl Mina can be contacted at  [phone]

## 2020-11-06 NOTE — Telephone Encounter (Signed)
Fine for verbals °

## 2020-11-06 NOTE — Telephone Encounter (Signed)
See below

## 2020-11-07 NOTE — Telephone Encounter (Signed)
Verbal orders given  

## 2020-11-08 DIAGNOSIS — J441 Chronic obstructive pulmonary disease with (acute) exacerbation: Secondary | ICD-10-CM | POA: Diagnosis not present

## 2020-11-08 DIAGNOSIS — Z9981 Dependence on supplemental oxygen: Secondary | ICD-10-CM | POA: Diagnosis not present

## 2020-11-08 DIAGNOSIS — Z741 Need for assistance with personal care: Secondary | ICD-10-CM | POA: Diagnosis not present

## 2020-11-08 DIAGNOSIS — J9611 Chronic respiratory failure with hypoxia: Secondary | ICD-10-CM | POA: Diagnosis not present

## 2020-11-08 DIAGNOSIS — F17210 Nicotine dependence, cigarettes, uncomplicated: Secondary | ICD-10-CM | POA: Diagnosis not present

## 2020-11-08 DIAGNOSIS — E119 Type 2 diabetes mellitus without complications: Secondary | ICD-10-CM | POA: Diagnosis not present

## 2020-11-08 DIAGNOSIS — J449 Chronic obstructive pulmonary disease, unspecified: Secondary | ICD-10-CM | POA: Diagnosis not present

## 2020-11-08 DIAGNOSIS — M6281 Muscle weakness (generalized): Secondary | ICD-10-CM | POA: Diagnosis not present

## 2020-11-09 ENCOUNTER — Ambulatory Visit (INDEPENDENT_AMBULATORY_CARE_PROVIDER_SITE_OTHER): Payer: Medicare Other | Admitting: Licensed Clinical Social Worker

## 2020-11-09 DIAGNOSIS — F0630 Mood disorder due to known physiological condition, unspecified: Secondary | ICD-10-CM

## 2020-11-09 DIAGNOSIS — R443 Hallucinations, unspecified: Secondary | ICD-10-CM

## 2020-11-09 DIAGNOSIS — F411 Generalized anxiety disorder: Secondary | ICD-10-CM | POA: Diagnosis not present

## 2020-11-09 DIAGNOSIS — F4310 Post-traumatic stress disorder, unspecified: Secondary | ICD-10-CM
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
## 2020-11-10 ENCOUNTER — Other Ambulatory Visit: Payer: Self-pay | Admitting: Internal Medicine

## 2020-11-10 DIAGNOSIS — E119 Type 2 diabetes mellitus without complications: Secondary | ICD-10-CM | POA: Diagnosis not present

## 2020-11-10 DIAGNOSIS — Z741 Need for assistance with personal care: Secondary | ICD-10-CM | POA: Diagnosis not present

## 2020-11-10 DIAGNOSIS — F17210 Nicotine dependence, cigarettes, uncomplicated: Secondary | ICD-10-CM | POA: Diagnosis not present

## 2020-11-10 DIAGNOSIS — Z9981 Dependence on supplemental oxygen: Secondary | ICD-10-CM | POA: Diagnosis not present

## 2020-11-10 DIAGNOSIS — M6281 Muscle weakness (generalized): Secondary | ICD-10-CM | POA: Diagnosis not present

## 2020-11-10 DIAGNOSIS — J441 Chronic obstructive pulmonary disease with (acute) exacerbation: Secondary | ICD-10-CM | POA: Diagnosis not present

## 2020-11-13 DIAGNOSIS — M6281 Muscle weakness (generalized): Secondary | ICD-10-CM | POA: Diagnosis not present

## 2020-11-13 DIAGNOSIS — Z9981 Dependence on supplemental oxygen: Secondary | ICD-10-CM | POA: Diagnosis not present

## 2020-11-13 DIAGNOSIS — J441 Chronic obstructive pulmonary disease with (acute) exacerbation: Secondary | ICD-10-CM | POA: Diagnosis not present

## 2020-11-13 DIAGNOSIS — G4733 Obstructive sleep apnea (adult) (pediatric): Secondary | ICD-10-CM | POA: Diagnosis not present

## 2020-11-13 DIAGNOSIS — J449 Chronic obstructive pulmonary disease, unspecified: Secondary | ICD-10-CM | POA: Diagnosis not present

## 2020-11-13 DIAGNOSIS — Z741 Need for assistance with personal care: Secondary | ICD-10-CM | POA: Diagnosis not present

## 2020-11-13 DIAGNOSIS — E119 Type 2 diabetes mellitus without complications: Secondary | ICD-10-CM | POA: Diagnosis not present

## 2020-11-13 DIAGNOSIS — F17210 Nicotine dependence, cigarettes, uncomplicated: Secondary | ICD-10-CM | POA: Diagnosis not present

## 2020-11-14 ENCOUNTER — Encounter: Payer: Self-pay | Admitting: *Deleted

## 2020-11-14 ENCOUNTER — Telehealth: Payer: Medicare Other

## 2020-11-14 ENCOUNTER — Telehealth: Payer: Self-pay | Admitting: *Deleted

## 2020-11-14 NOTE — Telephone Encounter (Signed)
  Chronic Care Management   Follow Up Note   11/14/2020 Name: Antonella Upson MRN: [id number] DOB: March 17, 1960   Referred by: [name], MD Reason for referral : Chronic Care Management (CCM RN CM Follow Telephone outreach- unsuccessful; COPD; HTN)  An unsuccessful telephone outreach was attempted today. The patient was referred to the case management team for assistance with care management and care coordination.   Patient returned my initial call and I re-attempted second outreach within 15 minutes of her call to me; left second voice message  Follow Up Plan:  A HIPPA compliant phone message x 2 was left for the patient providing contact information and requesting a return call Will place request with scheduling care guide to contact patient to re-schedule today's missed CCM RN follow up telephone appointment   Oneta Rack, RN, BSN, [place] [phone]: direct office [phone]: mobile

## 2020-11-15 DIAGNOSIS — J441 Chronic obstructive pulmonary disease with (acute) exacerbation: Secondary | ICD-10-CM | POA: Diagnosis not present

## 2020-11-15 DIAGNOSIS — F17210 Nicotine dependence, cigarettes, uncomplicated: Secondary | ICD-10-CM | POA: Diagnosis not present

## 2020-11-15 DIAGNOSIS — M6281 Muscle weakness (generalized): Secondary | ICD-10-CM | POA: Diagnosis not present

## 2020-11-15 DIAGNOSIS — Z9981 Dependence on supplemental oxygen: Secondary | ICD-10-CM | POA: Diagnosis not present

## 2020-11-15 DIAGNOSIS — Z741 Need for assistance with personal care: Secondary | ICD-10-CM | POA: Diagnosis not present

## 2020-11-15 DIAGNOSIS — E119 Type 2 diabetes mellitus without complications: Secondary | ICD-10-CM | POA: Diagnosis not present

## 2020-11-16 ENCOUNTER — Telehealth: Payer: Self-pay | Admitting: Pharmacist

## 2020-11-16 DIAGNOSIS — E119 Type 2 diabetes mellitus without complications: Secondary | ICD-10-CM | POA: Diagnosis not present

## 2020-11-16 DIAGNOSIS — J441 Chronic obstructive pulmonary disease with (acute) exacerbation: Secondary | ICD-10-CM | POA: Diagnosis not present

## 2020-11-16 DIAGNOSIS — M6281 Muscle weakness (generalized): Secondary | ICD-10-CM | POA: Diagnosis not present

## 2020-11-16 DIAGNOSIS — F17210 Nicotine dependence, cigarettes, uncomplicated: Secondary | ICD-10-CM | POA: Diagnosis not present

## 2020-11-16 DIAGNOSIS — Z741 Need for assistance with personal care: Secondary | ICD-10-CM | POA: Diagnosis not present

## 2020-11-16 DIAGNOSIS — Z9981 Dependence on supplemental oxygen: Secondary | ICD-10-CM | POA: Diagnosis not present

## 2020-11-16 NOTE — Progress Notes (Signed)
Chronic Care Management Pharmacy Assistant   Name: Joan [name]  MRN: [id number] DOB: [birth year]   Reason for Encounter: Disease State   Conditions to be addressed/monitored: COPD  Recent office visits:  10/31/20 [name], MD-(PCP) copd, no med changes  Recent consult visits:  10/24/20 Davy Pique MD, Brien Mates (Cervical spondylosis)  [facility] visits:   None since last coordination call  Medications: Outpatient Encounter Medications as of 11/16/2020  Medication Sig   acyclovir (ZOVIRAX) 400 MG tablet TAKE 1 TABLET BY MOUTH  TWICE DAILY   albuterol (PROVENTIL) (2.5 MG/3ML) 0.083% nebulizer solution Take 3 mLs (2.5 mg total) by nebulization 2 (two) times daily. And every 6 hours as needed.  DX: J44.9   albuterol (VENTOLIN HFA) 108 (90 Base) MCG/ACT inhaler Inhale 2 puffs into the lungs every 6 (six) hours as needed for wheezing or shortness of breath.   atenolol (TENORMIN) 25 MG tablet Take 25 mg by mouth daily.   atorvastatin (LIPITOR) 20 MG tablet TAKE 1 TABLET BY MOUTH  DAILY   bisoprolol (ZEBETA) 5 MG tablet Take 1 tablet (5 mg total) by mouth daily.   Budeson-Glycopyrrol-Formoterol (BREZTRI AEROSPHERE) 160-9-4.8 MCG/ACT AERO Inhale 2 puffs into the lungs in the morning and at bedtime.   Budeson-Glycopyrrol-Formoterol (BREZTRI AEROSPHERE) 160-9-4.8 MCG/ACT AERO Inhale 2 puffs into the lungs in the morning and at bedtime.   buPROPion (WELLBUTRIN XL) 150 MG 24 hr tablet TAKE 1 TABLET BY MOUTH  DAILY   busPIRone (BUSPAR) 5 MG tablet Take 0.5-1 tablets (2.5-5 mg total) by mouth 2 (two) times daily as needed.   Camphor-Menthol-Methyl Sal 3.03-02-08 % PTCH Apply 1 patch topically daily as needed (pain).   hydrochlorothiazide (HYDRODIURIL) 25 MG tablet TAKE 1 TABLET BY MOUTH  DAILY   hydrocortisone (ANUSOL-HC) 25 MG suppository Place 1 suppository (25 mg total) rectally 2 (two) times daily.   Ipratropium-Albuterol (COMBIVENT  RESPIMAT) 20-100 MCG/ACT AERS respimat Inhale 1 puff into the lungs every 6 (six) hours as needed for wheezing or shortness of breath.   levothyroxine (SYNTHROID) 25 MCG tablet TAKE 1 TABLET BY MOUTH  DAILY BEFORE BREAKFAST   Liniments (SALONPAS ARTHRITIS PAIN RELIEF EX) Apply 1 patch topically daily as needed (to painful sites- remove as directed).    metFORMIN (GLUCOPHAGE) 500 MG tablet Take 1 tablet (500 mg total) by mouth 2 (two) times daily with a meal.   montelukast (SINGULAIR) 10 MG tablet TAKE 1 TABLET BY MOUTH AT  BEDTIME   mupirocin cream (BACTROBAN) 2 % Apply topically daily.   naproxen (NAPROSYN) 500 MG tablet Take 1 tablet (500 mg total) by mouth 2 (two) times daily as needed for moderate pain.   nystatin-triamcinolone ointment (MYCOLOG) Apply 1 application topically 2 (two) times daily as needed (applied to skin folds/groins/under breast skin irritation rash.).   OXYGEN Inhale 2-3 L/min into the lungs See admin instructions. Inhale  2-3 L/min of oxygen into the lungs w/CPAP at bedtime and as needed for shortness of breath with "strenuous activity" during the day   pantoprazole (PROTONIX) 40 MG tablet TAKE 1 TABLET BY MOUTH  DAILY   PATADAY 0.2 % SOLN Apply 1 drop to eye daily.   polyethylene glycol (MIRALAX / GLYCOLAX) 17 g packet Take 17 g by mouth daily as needed for mild constipation.   pregabalin (LYRICA) 150 MG capsule TAKE 1 CAPSULE BY MOUTH AT  BEDTIME   QUEtiapine (SEROQUEL) 50 MG tablet TAKE 1 TABLET BY MOUTH AT  BEDTIME  roflumilast (DALIRESP) 500 MCG TABS tablet Take 1 tablet (500 mcg total) by mouth daily.   traZODone (DESYREL) 100 MG tablet TAKE 1 TABLET BY MOUTH AT  BEDTIME   varenicline (CHANTIX CONTINUING MONTH PAK) 1 MG tablet Take 1 tablet (1 mg total) by mouth 2 (two) times daily.   venlafaxine XR (EFFEXOR-XR) 150 MG 24 hr capsule TAKE 1 CAPSULE BY MOUTH  DAILY WITH BREAKFAST   witch hazel-glycerin (TUCKS) pad Apply topically as needed for itching.   No  facility-administered encounter medications on file as of 11/16/2020.    Contacted patient to discuss COPD disease state  Current COPD regimen: Breztri  aerosphere 160-9-4.8 mcg/act   Any recent hospitalizations or ED visits since last visit with CPP? No  Reports COPD symptoms, including Increased shortness of breath  and wheezing   What recent interventions/DTPs have been made by any provider to improve breathing since last visit:   Have you had exacerbation/flare-up since last visit? No  What do you do when you are short of breath?  Rescue medication  Current tobacco use? Interested in cessation? Yes  Respiratory Devices/Equipment Do you have a nebulizer? No Do you use a Peak Flow Meter? No Do you use a maintenance inhaler? Yes How often do you forget to use your daily inhaler? Rarely Do you use a rescue inhaler? Yes How often do you use your rescue inhaler?  prn, when really sob Do you use a spacer with your inhaler? Yes  Adherence Review: Does the patient have >5 day gap between last estimated fill date for maintenance inhaler medications? No  Pulmonology appointment with on 11/22/20  Care Gaps: Annual wellness visit in last year? No Most Recent BP reading:122/90 10/31/20  If Diabetic: Most recent A1C reading:6.8 08/09/20 Last eye exam / retinopathy screening:none noted Last diabetic foot exam:03/07/20  [facility] Medication Name  Last Fill Date  Day supply  Metformin 500 mg  08/18/20  90 Atorvastatin 20 mg  09/10/20  [place] Clinical Pharmacist Assistant [phone]   Time spent:40

## 2020-11-19 ENCOUNTER — Other Ambulatory Visit (HOSPITAL_COMMUNITY): Payer: Self-pay | Admitting: Psychiatry

## 2020-11-20 ENCOUNTER — Ambulatory Visit: Payer: Medicare Other | Admitting: Acute Care

## 2020-11-20 DIAGNOSIS — Z741 Need for assistance with personal care: Secondary | ICD-10-CM | POA: Diagnosis not present

## 2020-11-20 DIAGNOSIS — M6281 Muscle weakness (generalized): Secondary | ICD-10-CM | POA: Diagnosis not present

## 2020-11-20 DIAGNOSIS — J441 Chronic obstructive pulmonary disease with (acute) exacerbation: Secondary | ICD-10-CM | POA: Diagnosis not present

## 2020-11-20 DIAGNOSIS — Z9981 Dependence on supplemental oxygen: Secondary | ICD-10-CM | POA: Diagnosis not present

## 2020-11-20 DIAGNOSIS — F17210 Nicotine dependence, cigarettes, uncomplicated: Secondary | ICD-10-CM | POA: Diagnosis not present

## 2020-11-20 DIAGNOSIS — E119 Type 2 diabetes mellitus without complications: Secondary | ICD-10-CM | POA: Diagnosis not present

## 2020-11-21 DIAGNOSIS — Z9981 Dependence on supplemental oxygen: Secondary | ICD-10-CM | POA: Diagnosis not present

## 2020-11-21 DIAGNOSIS — Z741 Need for assistance with personal care: Secondary | ICD-10-CM | POA: Diagnosis not present

## 2020-11-21 DIAGNOSIS — E119 Type 2 diabetes mellitus without complications: Secondary | ICD-10-CM | POA: Diagnosis not present

## 2020-11-21 DIAGNOSIS — M6281 Muscle weakness (generalized): Secondary | ICD-10-CM | POA: Diagnosis not present

## 2020-11-21 DIAGNOSIS — F17210 Nicotine dependence, cigarettes, uncomplicated: Secondary | ICD-10-CM | POA: Diagnosis not present

## 2020-11-21 DIAGNOSIS — J441 Chronic obstructive pulmonary disease with (acute) exacerbation: Secondary | ICD-10-CM | POA: Diagnosis not present

## 2020-11-22 ENCOUNTER — Ambulatory Visit (INDEPENDENT_AMBULATORY_CARE_PROVIDER_SITE_OTHER): Payer: Medicare Other | Admitting: Acute Care

## 2020-11-22 ENCOUNTER — Encounter: Payer: Self-pay | Admitting: Acute Care

## 2020-11-22 ENCOUNTER — Other Ambulatory Visit: Payer: Self-pay

## 2020-11-22 ENCOUNTER — Telehealth: Payer: Self-pay | Admitting: *Deleted

## 2020-11-22 VITALS — BP 120/72 | HR 94 | Temp 97.3°F | Ht 68.0 in | Wt 208.6 lb

## 2020-11-22 DIAGNOSIS — J9611 Chronic respiratory failure with hypoxia: Secondary | ICD-10-CM | POA: Diagnosis not present

## 2020-11-22 DIAGNOSIS — Z720 Tobacco use: Secondary | ICD-10-CM

## 2020-11-22 DIAGNOSIS — F17210 Nicotine dependence, cigarettes, uncomplicated: Secondary | ICD-10-CM | POA: Diagnosis not present

## 2020-11-22 DIAGNOSIS — Z9989 Dependence on other enabling machines and devices: Secondary | ICD-10-CM | POA: Diagnosis not present

## 2020-11-22 DIAGNOSIS — G4733 Obstructive sleep apnea (adult) (pediatric): Secondary | ICD-10-CM | POA: Diagnosis not present

## 2020-11-22 DIAGNOSIS — J449 Chronic obstructive pulmonary disease, unspecified: Secondary | ICD-10-CM

## 2020-11-22 NOTE — Chronic Care Management (AMB) (Signed)
  Care Management   Note  11/22/2020 Name: Joan [name] MRN: [id number] DOB: 1960/10/27  Joan [name] is a 60 y.o. year old female who is a primary care patient of [name], MD and is actively engaged with the care management team. I reached out to Joan [name] by phone today to assist with re-scheduling a follow up visit with the RN Case Manager  Follow up plan: Unsuccessful telephone outreach attempt made. A HIPAA compliant phone message was left for the patient providing contact information and requesting a return call.  The care management team will reach out to the patient again over the next 7 days.  If patient returns call to provider office, please advise to call [place] at [place] [facility]  Direct Dial: [phone]

## 2020-11-22 NOTE — Progress Notes (Signed)
History of Present Illness Joan [name] is a 60 y.o. female  current every day  smoker with severe COPD and OSA on CPAP.  She is followed by Dr. Elsworth Soho.   Quit smoking 11/2018, and has restarted 04/2020 Symbicort as Maintenance She is using albuterol nebs twice daily She is using her Combivent as her rescue inhaler.  Allergies to tetracycline and Keflex    Recent Admission to the hospital 08/10/2020 for acute COPD exacerbation. She was seen by Dr. Lamonte Sakai and Dr. Chase Caller as an inpatient.She was treated with oxygen, BiPAP therapy, nebulized BD, IV steroids and antibiotics. She was discharged to a skilled nursing home, and is now at home with home health nursing and PT/OT. Pt. Was last seen in the office 8/24/for hospital follow up. She was confused about inhalers at that time and wanted to use Breztri. She was given samples and a prescription was sent in for refills. She was compliant with her CPAP therapy, AHI was well controlled at 2.   She was continuing to smoke daily. I have counseled her that she needs to quit smoking entirely. I prescribed Chantix started kit, and have asked her to return today to se how she is doing on the Chantix, and to check for reduction is smoking.     11/22/2020 Pt. Presents for follow up. She is doing well on Brextri. She does like it. She has stopped using her Symbicort. She is using rescue for breakthrough shortness of breath about once daily. She is clear throughout today.   She states she did not get her Chantix as she was hoping to do. There was an issue at the pharmacy, and that has been cleared up, but she needs to wait to get her money before she can pay the $48.00 to pick up the medication. She is setting up a quit date, and will start Chantix 1 week before her start date. We will do a follow up with her in 1 month so we can make sure she is tolerating it well.   She has complaints about her oxygen tanks. She is not wearing oxygen today. She states  that the tanks that have ben delivered to her have been delivered empty. She wants to get an [employer]. We will place the order request with her DME set her up for a walk, as she stated they have their own procedure to walk patient's to determine if they qualify for the [employer] tank.. She is using [employer] as her DME.   She has been started on Buspar for her anxiety by her PCP. She feels like this has really helped her anxiety. She is seeing a Teacher, music.    She is compliant with her CPAP. She is bleeding 2L of oxygen into her CPAP.  Test Results:      NPSG 02/2017 >> no OSA HST  04/2017 15/h 02/2019 pFTS >> RATIO 45, fev 1 37% - UNCHANGED , + bd response, DLCO 44%  10/2016 PFTs showed ratio 49, FEV1 of 36%, FVC 58% and DLCO of 34% consistent with severe obstruction. 02/2020 LDCT >>  stable nodules  11/2016 CT chest bullous changes BL, 46m RUl & 2 mm LUL  nodules >> stable 11/2017  ONO 11/2016 on CPAP/room air showed desaturation for 1 hour 45 minutes started on nocturnal oxygen>>    CBC Latest Ref Rng & Units 08/15/2020 08/13/2020 08/12/2020  WBC 4.0 - 10.5 K/uL 16.5(H) 16.5(H) 15.1(H)  Hemoglobin 12.0 - 15.0 g/dL 12.6 13.1 13.6  Hematocrit 36.0 -  46.0 % 39.7 38.3 42.3  Platelets 150 - 400 K/uL 269 270 278    BMP Latest Ref Rng & Units 08/15/2020 08/13/2020 08/12/2020  Glucose 70 - 99 mg/dL 178(H) 212(H) 173(H)  BUN 6 - 20 mg/dL 26(H) 28(H) 25(H)  Creatinine 0.44 - 1.00 mg/dL 0.88 0.86 0.92  BUN/Creat Ratio 6 - 22 (calc) - - -  Sodium 135 - 145 mmol/L 139 137 137  Potassium 3.5 - 5.1 mmol/L 4.1 4.1 3.8  Chloride 98 - 111 mmol/L 99 101 96(L)  CO2 22 - 32 mmol/L 32 28 31  Calcium 8.9 - 10.3 mg/dL 9.0 8.8(L) 9.1    BNP    Component Value Date/Time   BNP 76.7 08/07/2020 1943    ProBNP No results found for: PROBNP  PFT    Component Value Date/Time   FEV1PRE 1.06 03/17/2019 1444   FEV1POST 1.24 03/17/2019 1444   FVCPRE 2.35 03/17/2019 1444   FVCPOST 2.66 03/17/2019 1444   TLC 5.72  03/17/2019 1444   DLCOUNC 9.80 03/17/2019 1444   PREFEV1FVCRT 45 03/17/2019 1444   PSTFEV1FVCRT 47 03/17/2019 1444    No results found.   Past medical hx Past Medical History:  Diagnosis Date   Acute respiratory failure with hypoxia ([place])    Alcoholism and drug addiction in family    Anxiety    Appendicitis    Arthritis    "knees, hips, lower back" (09/25/2016)   Asthma    Bipolar disorder ([place])    Chronic bronchitis ([place])    "all the time" (09/25/2016)   Chronic leg pain    RLE   Chronic lower back pain    COPD (chronic obstructive pulmonary disease) ([place])    Depression    Diabetes mellitus without complication (HCC)    Diabetic neuropathy ([place])    Early cataracts, bilateral    Emphysema of lung ([place])    Generalized anxiety disorder 02/09/2020   GERD (gastroesophageal reflux disease)    Headache    Hepatitis C    "treated back in 2001-2002"   High cholesterol    Hypertension    Hypothyroidism    Incarcerated ventral hernia 04/04/2017   Mood disorder in conditions classified elsewhere 02/09/2020   On home oxygen therapy    "2L; at nighttime" (04/04/2017)   OSA on CPAP    CPAP with Oxygen 2 liters   Paranoid ([place])    Pneumonia    "all the time" (09/25/2016)   Positive PPD    with negative chest x ray   PTSD (post-traumatic stress disorder) 02/09/2020   Tachycardia    patient reports with exertion ; denies any other conditions    Thyroid disease    Wears glasses    Wears partial dentures      Social History   Tobacco Use   Smoking status: Every Day    Packs/day: 0.50    Years: 44.00    Pack years: 22.00    Types: Cigarettes   Smokeless tobacco: Never   Tobacco comments:    down to 0.5ppd 06/30/20/jm  Vaping Use   Vaping Use: Never used  Substance Use Topics   Alcohol use: Not Currently   Drug use: Not Currently    Types: "Crack" cocaine    Comment:  "nothing since 01/12/2012"    Ms.Ricotta reports that she has been smoking cigarettes. She has a 22.00  pack-year smoking history. She has never used smokeless tobacco. She reports that she does not currently use alcohol. She reports that she  does not currently use drugs after having used the following drugs: "Crack" cocaine.  Tobacco Cessation: Smoking cessation counseling done. Patient is  planning on setting a quit date to start taking Chantix starter pack  to work on smoking cessation.   Past surgical hx, Family hx, Social hx all reviewed.  Current Outpatient Medications on File Prior to Visit  Medication Sig   acyclovir (ZOVIRAX) 400 MG tablet TAKE 1 TABLET BY MOUTH  TWICE DAILY   albuterol (PROVENTIL) (2.5 MG/3ML) 0.083% nebulizer solution Take 3 mLs (2.5 mg total) by nebulization 2 (two) times daily. And every 6 hours as needed.  DX: J44.9   albuterol (VENTOLIN HFA) 108 (90 Base) MCG/ACT inhaler Inhale 2 puffs into the lungs every 6 (six) hours as needed for wheezing or shortness of breath.   atenolol (TENORMIN) 25 MG tablet Take 25 mg by mouth daily.   atorvastatin (LIPITOR) 20 MG tablet TAKE 1 TABLET BY MOUTH  DAILY   bisoprolol (ZEBETA) 5 MG tablet Take 1 tablet (5 mg total) by mouth daily.   Budeson-Glycopyrrol-Formoterol (BREZTRI AEROSPHERE) 160-9-4.8 MCG/ACT AERO Inhale 2 puffs into the lungs in the morning and at bedtime.   Budeson-Glycopyrrol-Formoterol (BREZTRI AEROSPHERE) 160-9-4.8 MCG/ACT AERO Inhale 2 puffs into the lungs in the morning and at bedtime.   buPROPion (WELLBUTRIN XL) 150 MG 24 hr tablet TAKE 1 TABLET BY MOUTH  DAILY   busPIRone (BUSPAR) 5 MG tablet Take 0.5-1 tablets (2.5-5 mg total) by mouth 2 (two) times daily as needed.   Camphor-Menthol-Methyl Sal 3.03-02-08 % PTCH Apply 1 patch topically daily as needed (pain).   hydrochlorothiazide (HYDRODIURIL) 25 MG tablet TAKE 1 TABLET BY MOUTH  DAILY   hydrocortisone (ANUSOL-HC) 25 MG suppository Place 1 suppository (25 mg total) rectally 2 (two) times daily.   Ipratropium-Albuterol (COMBIVENT RESPIMAT) 20-100 MCG/ACT AERS  respimat Inhale 1 puff into the lungs every 6 (six) hours as needed for wheezing or shortness of breath.   levothyroxine (SYNTHROID) 25 MCG tablet TAKE 1 TABLET BY MOUTH  DAILY BEFORE BREAKFAST   Liniments (SALONPAS ARTHRITIS PAIN RELIEF EX) Apply 1 patch topically daily as needed (to painful sites- remove as directed).    metFORMIN (GLUCOPHAGE) 500 MG tablet Take 1 tablet (500 mg total) by mouth 2 (two) times daily with a meal.   montelukast (SINGULAIR) 10 MG tablet TAKE 1 TABLET BY MOUTH AT  BEDTIME   mupirocin cream (BACTROBAN) 2 % Apply topically daily.   naproxen (NAPROSYN) 500 MG tablet Take 1 tablet (500 mg total) by mouth 2 (two) times daily as needed for moderate pain.   nystatin-triamcinolone ointment (MYCOLOG) Apply 1 application topically 2 (two) times daily as needed (applied to skin folds/groins/under breast skin irritation rash.).   OXYGEN Inhale 2-3 L/min into the lungs See admin instructions. Inhale  2-3 L/min of oxygen into the lungs w/CPAP at bedtime and as needed for shortness of breath with "strenuous activity" during the day   pantoprazole (PROTONIX) 40 MG tablet TAKE 1 TABLET BY MOUTH  DAILY   PATADAY 0.2 % SOLN Apply 1 drop to eye daily.   polyethylene glycol (MIRALAX / GLYCOLAX) 17 g packet Take 17 g by mouth daily as needed for mild constipation.   pregabalin (LYRICA) 150 MG capsule TAKE 1 CAPSULE BY MOUTH AT  BEDTIME   QUEtiapine (SEROQUEL) 50 MG tablet TAKE 1 TABLET BY MOUTH AT  BEDTIME   roflumilast (DALIRESP) 500 MCG TABS tablet Take 1 tablet (500 mcg total) by mouth daily.   traZODone (DESYREL)  100 MG tablet TAKE 1 TABLET BY MOUTH AT  BEDTIME   varenicline (CHANTIX CONTINUING MONTH PAK) 1 MG tablet Take 1 tablet (1 mg total) by mouth 2 (two) times daily.   venlafaxine XR (EFFEXOR-XR) 150 MG 24 hr capsule TAKE 1 CAPSULE BY MOUTH  DAILY WITH BREAKFAST   witch hazel-glycerin (TUCKS) pad Apply topically as needed for itching.   No current facility-administered  medications on file prior to visit.     Allergies  Allergen Reactions   Keflex [Cephalexin] Rash and Other (See Comments)    Has tolerated amoxicillin since had keflex   Prednisone Other (See Comments)    "counter reacts"   Shellfish Allergy Shortness Of Breath, Nausea And Vomiting and Other (See Comments)    Stomach cramps   Tetracyclines & Related Anaphylaxis, Swelling and Other (See Comments)    Throat swelling requiring hospitalization   Dilaudid [Hydromorphone Hcl] Other (See Comments)    "Lethargy"   Incruse Ellipta [Umeclidinium Bromide] Nausea Only   Other    Tuberculin Tests Other (See Comments)    False postive    Review Of Systems:  Constitutional:   No  weight loss, night sweats,  Fevers, chills, fatigue, or  lassitude.  HEENT:   No headaches,  Difficulty swallowing,  Tooth/dental problems, or  Sore throat,                No sneezing, itching, ear ache, nasal congestion, post nasal drip,   CV:  No chest pain,  Orthopnea, PND, swelling in lower extremities, anasarca, dizziness, palpitations, syncope.   GI  No heartburn, indigestion, abdominal pain, nausea, vomiting, diarrhea, change in bowel habits, loss of appetite, bloody stools.   Resp: No shortness of breath with exertion or at rest.  No excess mucus, no productive cough,  No non-productive cough,  No coughing up of blood.  No change in color of mucus.  No wheezing.  No chest wall deformity  Skin: no rash or lesions.  GU: no dysuria, change in color of urine, no urgency or frequency.  No flank pain, no hematuria   MS:  No joint pain or swelling.  No decreased range of motion.  No back pain.  Psych:  No change in mood or affect. No depression or anxiety.  No memory loss.   Vital Signs There were no vitals taken for this visit.   Physical Exam:  General- No distress,  A&Ox3 ENT: No sinus tenderness, TM clear, pale nasal mucosa, no oral exudate,no post nasal drip, no LAN Cardiac: S1, S2, regular rate and  rhythm, no murmur Chest: No wheeze/ rales/ dullness; no accessory muscle use, no nasal flaring, no sternal retractions Abd.: Soft Non-tender Ext: No clubbing cyanosis, edema Neuro:  normal strength Skin: No rashes, warm and dry Psych: normal mood and behavior   Assessment/Plan  Severe COPD Continues to smoke Plan Continue using Breztri Use 2 puffs in the morning and 2 puffs in the evening. Rinse mouth after use. Do not do Symbicort while you are using the [facility]. We will get a spacer sent to you.  Continue Daliresp and Singulair  and protonix as you have been doing  Use Combivent as rescue per Dr. Bari Mantis recommendation.  Add Mucinex 1200 mg once daily , take with full glass of water.  Continue using your Flutter valve, 4 blows three times daily. Make sure you clean flutter valve once a week. This should help thin your secretions and get them up.  Note your daily symptoms >  remember "red flags" for COPD:  Increase in cough, increase in sputum production, increase in shortness of breath or activity intolerance. If you notice these symptoms, please call to be seen.    Follow up in 1 month with Judson Roch NP to check to see how you are doing on Chantix and with smoking cessation.  Please contact office for sooner follow up if symptoms do not improve or worsen or seek emergency care    Chronic Respiratory Failure Plan Titrate oxygen for sats of > 88% at all timed. Wear oxygen at  3L [place] We will send order to [employer] for qualifying walk to see if you qualify for the device  OSA  on CPAP Plan Continue on CPAP at bedtime. You appear to be benefiting from the treatment  Goal is to wear for at least 6 hours each night for maximal clinical benefit. Continue to work on weight loss, as the link between excess weight  and sleep apnea is well established.   Remember to establish a good bedtime routine, and work on sleep hygiene.  Limit daytime naps , avoid stimulants such as caffeine and nicotine  close to bedtime, exercise daily to promote sleep quality, avoid heavy , spicy, fried , or rich foods before bed. Ensure adequate exposure to natural light during the day,establish a relaxing bedtime routine with a pleasant sleep environment ( Bedroom between 60 and 67 degrees, turn off bright lights , TV or device screens screens , consider black out curtains or white noise machines) Do not drive if sleepy. Remember to clean mask, tubing, filter, and reservoir once weekly with soapy water.  Follow up with Judson Roch NP or Dr. Elsworth Soho   In 1 or before as needed.      Tobacco Abuse Plan I have spent 4 minutes counseling patient on smoking cessation this visit. We have reviewed the risks of continued smoking on his current health situation. Patient verbalizes understanding of their  choice to continue smoking and the negative health consequences including worsening of COPD, risk of lung cancer , stroke and heart disease.Marland Kitchen    Please work on quitting smoking immediately.  Call 1-800-quit Now for free nicotine replacement therapy.  We will prescribe Chantix started kit today. Follow up in 1 month to see how you are doing.  Low Dose CT for Lung Cancer screening Due 02/2021  I spent 35 minutes dedicated to the care of this patient on the date of this encounter to include pre-visit review of records, face-to-face time with the patient discussing conditions above, post visit ordering of testing, clinical documentation with the electronic health record, making appropriate referrals as documented, and communicating necessary information to the patient's healthcare team.   [name], NP 11/22/2020  1:12 PM

## 2020-11-22 NOTE — Patient Instructions (Addendum)
Continue using Breztri Use 2 puffs in the morning and 2 puffs in the evening. Rinse mouth after use. Do not do Symbicort while you are using the [facility]. We will get a spacer sent to you.  We will send in a prescription for 90 days of Breztri through [employer].  Continue Daliresp and Singulair  and protonix as you have been doing  Use Combivent as rescue per Dr. Bari Mantis recommendation.  Add Mucinex 1200 mg once daily , take with full glass of water.  Continue using your Flutter valve, 4 blows three times daily. Make sure you clean flutter valve once a week. This should help thin your secretions and get them up.  Note your daily symptoms > remember "red flags" for COPD:  Increase in cough, increase in sputum production, increase in shortness of breath or activity intolerance. If you notice these symptoms, please call to be seen.    Follow up in 1 month with Judson Roch NP to check to see how you are doing on Chantix and with smoking cessation.  Please contact office for sooner follow up if symptoms do not improve or worsen or seek emergency care    OSA  on CPAP Plan Continue on CPAP at bedtime. You appear to be benefiting from the treatment  Goal is to wear for at least 6 hours each night for maximal clinical benefit. Continue to work on weight loss, as the link between excess weight  and sleep apnea is well established.   Remember to establish a good bedtime routine, and work on sleep hygiene.  Limit daytime naps , avoid stimulants such as caffeine and nicotine close to bedtime, exercise daily to promote sleep quality, avoid heavy , spicy, fried , or rich foods before bed. Ensure adequate exposure to natural light during the day,establish a relaxing bedtime routine with a pleasant sleep environment ( Bedroom between 60 and 67 degrees, turn off bright lights , TV or device screens screens , consider black out curtains or white noise machines) Do not drive if sleepy. Remember to clean mask, tubing,  filter, and reservoir once weekly with soapy water.  Follow up with Judson Roch NP or Dr. Elsworth Soho   In 1 or before as needed.     Titrate oxygen for sats of > 88% at all timed. Wear oxygen at  3L [place] We will send order to [employer] for qualifying walk to see if you qualify for the device   Tobacco Abuse Plan I have spent 4 minutes counseling patient on smoking cessation this visit. We have reviewed the risks of continued smoking on his current health situation. Patient verbalizes understanding of their  choice to continue smoking and the negative health consequences including worsening of COPD, risk of lung cancer , stroke and heart disease.Marland Kitchen    Please work on quitting smoking immediately.  Call 1-800-quit Now for free nicotine replacement therapy.  We will prescribe Chantix started kit today. Set your quit date  Follow up in 1 month to see how you are doing.  Low Dose CT for Lung Cancer screening Due 02/2021

## 2020-11-23 ENCOUNTER — Ambulatory Visit (INDEPENDENT_AMBULATORY_CARE_PROVIDER_SITE_OTHER): Payer: Medicare Other | Admitting: Licensed Clinical Social Worker

## 2020-11-23 DIAGNOSIS — F411 Generalized anxiety disorder: Secondary | ICD-10-CM | POA: Diagnosis not present

## 2020-11-23 DIAGNOSIS — R443 Hallucinations, unspecified: Secondary | ICD-10-CM | POA: Diagnosis not present

## 2020-11-23 DIAGNOSIS — F0630 Mood disorder due to known physiological condition, unspecified: Secondary | ICD-10-CM

## 2020-11-23 DIAGNOSIS — F4310 Post-traumatic stress disorder, unspecified: Secondary | ICD-10-CM

## 2020-11-23 NOTE — Progress Notes (Signed)
Virtual Visit via Video Note  I connected with Joan [name] on 11/23/20 at  1:00 PM EDT by a video enabled telemedicine application and verified that I am speaking with the correct person using two identifiers.  Location: Patient: home Provider: home office   I discussed the limitations of evaluation and management by telemedicine and the availability of in person appointments. The patient expressed understanding and agreed to proceed.   I discussed the assessment and treatment plan with the patient. The patient was provided an opportunity to ask questions and all were answered. The patient agreed with the plan and demonstrated an understanding of the instructions.   The patient was advised to call back or seek an in-person evaluation if the symptoms worsen or if the condition fails to improve as anticipated.  I provided 54 minutes of non-face-to-face time during this encounter.  THERAPIST PROGRESS NOTE  Session Time: 1:00 PM to 1:54 PM  Participation Level: Active  Behavioral Response: CasualAlertIrritable and appropriate  Type of Therapy: Individual Therapy  Treatment Goals addressed:  PTSD, coping, wants to work on where she is and work through her childhood Interventions: Solution Focused, Strength-based, Supportive, and Other: coping  Summary: Joan [name] is a 60 y.o. female who presents with need to get to ENT dry stuff gets into throat and they clean it out impacts her voice box. Has to pick up Chantix to help her stop smoking. Changed inhalers instead of 4-5 has two. Brother got mad at her the other day after what she told him what he had said. He told her that he hoped she die in her sleep. That was motivation to start looking for apartments in addition to assisted living. Wants to get out of there. When bring up what he said and done to her he is up and running away. They do talk but apart a lot. Don't argue don't have time for him, daughter said don't waste  energy on him go into room. Asks [name] to not lose herself in the argument. Need to go to assisted living and not nursing facility more of what she needs. Got to meet the team open house at one of the places has a bedroom and bathroom. At the assisted living place talking to a woman back and forth. Needs to know the price. Tuesday called Medicaid. Michela Pitcher made too much money. Michela Pitcher needs help with assisted living. They can help her with that. She needs to bring letter from doctor that she can't live alone safety, medication list, her history everything going on, what is going on in body. See medical history and see that she needs to be there. Hoping they can supply oxygen costs her $17.17. First one she stepped into see. Been into a virtual one. Discussed frustrations with brother making it difficult to live with. Making things inconvenient, leaving things could trip on. Talked about brother's sick dog who is dying and therapist pointed out that can be difficult. Friday has three places to see. [email or web address] working with. OT sent a bunch of screen shots for assisted living. Nurse wants to re-certify patient hope that  they can keep coming as well as nurse. She should be coming to see how things going on oxygen nurse wants to keep her to November and December. Nurse is from the from [place]".  Now OP, PT coming once a week.  OT-[facility]. Loves her. PT-[facility] Comes physical therapist likes her as while. .Therapist noted positive to have people come in  to see her that she likes. That she is isolated and patient shares that she has nowhere to go. Therapist pointed out that her mobility makes it hard as well to go places. Therapist asked for check in with mood and patient responded that she is Learning not to let brother bother her and walk away from the situation. Discussed spirituality and [name] communicates with [place] in different ways. Thinks can identify. For example when going through a bad time. Through music  and TV. Telepathy something pops into your mind, mind tells you what to do. Therapist transitioned to exploring more what life is like to understand better things she has to cope with. Supposed to use oxygen all the time it can bother her burns her nose sometimes, depends if the water bottle is fitting right. .   Therapist reviewed symptoms, facilitated expression of thoughts and feelings utilized this as a treatment intervention to help patient processed through and cope with frustrations related to her brother.  Noted patient is working on this trigger as she is actively looking for assisted living, therapist provided perspective of how that is something for her to feel positive and look forward to.  She also was working on it by not letting him get to her and going into her room if she needs to.  Assess this as positive sign for patient and coping by working on this.  Validated patient on how she was feeling.  Explored different aspects of her life to get a better understanding of some of her struggles such as supposed to be on oxygen all the time, likes her PT and OT and therapist noted as she is isolated its nice that they come into the house.  Noted the physical struggles patient can have because of her physical issues that are limiting in terms of her mobility causing issues when brother leaves things out and can trip on them.  Noted strength the patient has her spirituality helping her coping with all aspects of her life.  Therapist provided active listening, open questions, supportive interventions.    Suicidal/Homicidal: No  Plan: Return again in 3 weeks.2. Help patient in processing feelings to manage stressors, explore helpful coping strategies, provide strength-based and supportive intervention  Diagnosis: Axis I: PTSD, hallucinations, GAD, Mood Disorder in conditions classified elsewhere    Axis II: No diagnosis    [name], LCSW 11/23/2020

## 2020-11-24 DIAGNOSIS — F17210 Nicotine dependence, cigarettes, uncomplicated: Secondary | ICD-10-CM | POA: Diagnosis not present

## 2020-11-24 DIAGNOSIS — Z9981 Dependence on supplemental oxygen: Secondary | ICD-10-CM | POA: Diagnosis not present

## 2020-11-24 DIAGNOSIS — Z741 Need for assistance with personal care: Secondary | ICD-10-CM | POA: Diagnosis not present

## 2020-11-24 DIAGNOSIS — M6281 Muscle weakness (generalized): Secondary | ICD-10-CM | POA: Diagnosis not present

## 2020-11-24 DIAGNOSIS — J441 Chronic obstructive pulmonary disease with (acute) exacerbation: Secondary | ICD-10-CM | POA: Diagnosis not present

## 2020-11-24 DIAGNOSIS — E119 Type 2 diabetes mellitus without complications: Secondary | ICD-10-CM | POA: Diagnosis not present

## 2020-11-26 ENCOUNTER — Other Ambulatory Visit: Payer: Self-pay | Admitting: Acute Care

## 2020-11-26 DIAGNOSIS — Z720 Tobacco use: Secondary | ICD-10-CM

## 2020-11-28 NOTE — Telephone Encounter (Signed)
Needs a visit prior to refill.

## 2020-11-28 NOTE — Telephone Encounter (Signed)
[name], please advise if you are okay with Rx being switched to a 90-day supply for pt per request.

## 2020-11-29 ENCOUNTER — Ambulatory Visit (INDEPENDENT_AMBULATORY_CARE_PROVIDER_SITE_OTHER): Payer: Medicare Other | Admitting: *Deleted

## 2020-11-29 ENCOUNTER — Telehealth: Payer: Self-pay | Admitting: Acute Care

## 2020-11-29 DIAGNOSIS — E119 Type 2 diabetes mellitus without complications: Secondary | ICD-10-CM | POA: Diagnosis not present

## 2020-11-29 DIAGNOSIS — K219 Gastro-esophageal reflux disease without esophagitis: Secondary | ICD-10-CM | POA: Diagnosis not present

## 2020-11-29 DIAGNOSIS — F419 Anxiety disorder, unspecified: Secondary | ICD-10-CM

## 2020-11-29 DIAGNOSIS — F32A Depression, unspecified: Secondary | ICD-10-CM | POA: Diagnosis not present

## 2020-11-29 DIAGNOSIS — M6281 Muscle weakness (generalized): Secondary | ICD-10-CM | POA: Diagnosis not present

## 2020-11-29 DIAGNOSIS — F17210 Nicotine dependence, cigarettes, uncomplicated: Secondary | ICD-10-CM | POA: Diagnosis not present

## 2020-11-29 DIAGNOSIS — J449 Chronic obstructive pulmonary disease, unspecified: Secondary | ICD-10-CM

## 2020-11-29 DIAGNOSIS — Z9981 Dependence on supplemental oxygen: Secondary | ICD-10-CM | POA: Diagnosis not present

## 2020-11-29 DIAGNOSIS — J441 Chronic obstructive pulmonary disease with (acute) exacerbation: Secondary | ICD-10-CM | POA: Diagnosis not present

## 2020-11-29 DIAGNOSIS — J9611 Chronic respiratory failure with hypoxia: Secondary | ICD-10-CM

## 2020-11-29 DIAGNOSIS — E785 Hyperlipidemia, unspecified: Secondary | ICD-10-CM | POA: Diagnosis not present

## 2020-11-29 DIAGNOSIS — Z741 Need for assistance with personal care: Secondary | ICD-10-CM | POA: Diagnosis not present

## 2020-11-29 DIAGNOSIS — E118 Type 2 diabetes mellitus with unspecified complications: Secondary | ICD-10-CM

## 2020-11-29 NOTE — Patient Instructions (Signed)
Visit Information  PATIENT GOALS:  Goals Addressed   None     The patient verbalized understanding of instructions, educational materials, and care plan provided today and declined offer to receive copy of patient instructions, educational materials, and care plan.   Telephone follow up appointment with care management team member scheduled for:01/13/21  Eduard Clos MSW, LCSW Licensed Clinical Social Worker [place] [phone]

## 2020-11-29 NOTE — Telephone Encounter (Signed)
Tried to call pt to schedule her for a mychart video visit but unable to reach pt. Left message for her to return call so we can get appt scheduled prior to refilling the chantix.

## 2020-11-29 NOTE — Chronic Care Management (AMB) (Signed)
Chronic Care Management    Clinical Social Work Note  11/29/2020 Name: Joan [name] MRN: [id number] DOB: 11-18-1960  Joan [name] is a 60 y.o. year old female who is a primary care patient of Sharlet Salina Real Cons, MD. The CCM team was consulted to assist the patient with chronic disease management and/or care coordination needs related to: [employer]  and Level of Care Concerns.   Engaged with patient by telephone for follow up visit in response to provider referral for social work chronic care management and care coordination services.   Consent to Services:  The patient was given information about Chronic Care Management services, agreed to services, and gave verbal consent prior to initiation of services.  Please see initial visit note for detailed documentation.   Patient agreed to services and consent obtained.   Assessment: Review of patient past medical history, allergies, medications, and health status, including review of relevant consultants reports was performed today as part of a comprehensive evaluation and provision of chronic care management and care coordination services.     SDOH (Social Determinants of Health) assessments and interventions performed:    Advanced Directives Status: Not addressed in this encounter.  CCM Care Plan  Allergies  Allergen Reactions   Keflex [Cephalexin] Rash and Other (See Comments)    Has tolerated amoxicillin since had keflex   Prednisone Other (See Comments)    "counter reacts"   Shellfish Allergy Shortness Of Breath, Nausea And Vomiting and Other (See Comments)    Stomach cramps   Tetracyclines & Related Anaphylaxis, Swelling and Other (See Comments)    Throat swelling requiring hospitalization   Dilaudid [Hydromorphone Hcl] Other (See Comments)    "Lethargy"   Incruse Ellipta [Umeclidinium Bromide] Nausea Only   Other    Tuberculin Tests Other (See Comments)    False postive    Outpatient Encounter  Medications as of 11/29/2020  Medication Sig   acyclovir (ZOVIRAX) 400 MG tablet TAKE 1 TABLET BY MOUTH  TWICE DAILY   albuterol (PROVENTIL) (2.5 MG/3ML) 0.083% nebulizer solution Take 3 mLs (2.5 mg total) by nebulization 2 (two) times daily. And every 6 hours as needed.  DX: J44.9   albuterol (VENTOLIN HFA) 108 (90 Base) MCG/ACT inhaler Inhale 2 puffs into the lungs every 6 (six) hours as needed for wheezing or shortness of breath.   atenolol (TENORMIN) 25 MG tablet Take 25 mg by mouth daily.   atorvastatin (LIPITOR) 20 MG tablet TAKE 1 TABLET BY MOUTH  DAILY   bisoprolol (ZEBETA) 5 MG tablet Take 1 tablet (5 mg total) by mouth daily.   Budeson-Glycopyrrol-Formoterol (BREZTRI AEROSPHERE) 160-9-4.8 MCG/ACT AERO Inhale 2 puffs into the lungs in the morning and at bedtime.   buPROPion (WELLBUTRIN XL) 150 MG 24 hr tablet TAKE 1 TABLET BY MOUTH  DAILY   busPIRone (BUSPAR) 5 MG tablet Take 0.5-1 tablets (2.5-5 mg total) by mouth 2 (two) times daily as needed.   Camphor-Menthol-Methyl Sal 3.03-02-08 % PTCH Apply 1 patch topically daily as needed (pain).   cyclobenzaprine (FLEXERIL) 10 MG tablet Take 10 mg by mouth at bedtime.   hydrochlorothiazide (HYDRODIURIL) 25 MG tablet TAKE 1 TABLET BY MOUTH  DAILY   hydrocortisone (ANUSOL-HC) 25 MG suppository Place 1 suppository (25 mg total) rectally 2 (two) times daily.   Ipratropium-Albuterol (COMBIVENT RESPIMAT) 20-100 MCG/ACT AERS respimat Inhale 1 puff into the lungs every 6 (six) hours as needed for wheezing or shortness of breath.   levothyroxine (SYNTHROID) 25 MCG tablet TAKE 1 TABLET  BY MOUTH  DAILY BEFORE BREAKFAST   Liniments (SALONPAS ARTHRITIS PAIN RELIEF EX) Apply 1 patch topically daily as needed (to painful sites- remove as directed).    metFORMIN (GLUCOPHAGE) 500 MG tablet Take 1 tablet (500 mg total) by mouth 2 (two) times daily with a meal.   montelukast (SINGULAIR) 10 MG tablet TAKE 1 TABLET BY MOUTH AT  BEDTIME   mupirocin cream (BACTROBAN) 2  % Apply topically daily.   naproxen (NAPROSYN) 500 MG tablet Take 1 tablet (500 mg total) by mouth 2 (two) times daily as needed for moderate pain.   nystatin-triamcinolone ointment (MYCOLOG) Apply 1 application topically 2 (two) times daily as needed (applied to skin folds/groins/under breast skin irritation rash.).   OXYGEN Inhale 2-3 L/min into the lungs See admin instructions. Inhale  2-3 L/min of oxygen into the lungs w/CPAP at bedtime and as needed for shortness of breath with "strenuous activity" during the day   pantoprazole (PROTONIX) 40 MG tablet TAKE 1 TABLET BY MOUTH  DAILY   PATADAY 0.2 % SOLN Apply 1 drop to eye daily.   polyethylene glycol (MIRALAX / GLYCOLAX) 17 g packet Take 17 g by mouth daily as needed for mild constipation.   pregabalin (LYRICA) 150 MG capsule TAKE 1 CAPSULE BY MOUTH AT  BEDTIME   QUEtiapine (SEROQUEL) 50 MG tablet TAKE 1 TABLET BY MOUTH AT  BEDTIME   roflumilast (DALIRESP) 500 MCG TABS tablet Take 1 tablet (500 mcg total) by mouth daily.   traZODone (DESYREL) 100 MG tablet TAKE 1 TABLET BY MOUTH AT  BEDTIME   varenicline (CHANTIX CONTINUING MONTH PAK) 1 MG tablet Take 1 tablet (1 mg total) by mouth 2 (two) times daily.   venlafaxine XR (EFFEXOR-XR) 150 MG 24 hr capsule TAKE 1 CAPSULE BY MOUTH  DAILY WITH BREAKFAST   witch hazel-glycerin (TUCKS) pad Apply topically as needed for itching.   No facility-administered encounter medications on file as of 11/29/2020.    Patient Active Problem List   Diagnosis Date Noted   Depression 08/16/2020   PTSD (post-traumatic stress disorder) 02/09/2020   Mood disorder in conditions classified elsewhere 02/09/2020   Generalized anxiety disorder 02/09/2020   Type II diabetes mellitus with manifestations ([place]) 10/29/2019   Coarse tremors 10/28/2019   Chronic rhinitis 10/21/2019   Allergic rhinitis 06/16/2019   Cervical spondylosis without myelopathy 12/18/2018   Hypertension    Encounter for general adult medical  examination with abnormal findings 11/06/2018   Smokers' cough ([place]) 01/15/2018   Hypomagnesemia 12/29/2017   Pulmonary nodule 11/11/2017   Carotid artery disease ([place]) 11/05/2017   OSA on CPAP 08/07/2017   Gastroesophageal reflux disease 06/23/2017   Hypothyroidism due to acquired atrophy of thyroid 04/05/2017   Primary osteoarthritis involving multiple joints 04/05/2017   Chronic constipation 02/10/2017   Iron deficiency anemia due to dietary causes 02/10/2017   Osteoarthritis 02/10/2017   Hyperlipidemia associated with type 2 diabetes mellitus ([place]) 02/10/2017   Chronic respiratory failure with hypoxia ([place]) 12/16/2016   Essential hypertension 11/29/2016   Vocal cord dysfunction    COPD, severe ([place]) 09/24/2016   Anxiety 09/24/2016    Conditions to be addressed/monitored: COPD and Anxiety; Financial constraints related to limited income, Housing barriers, and Level of care concerns  Care Plan : [place]  Updates made by Deirdre Peer, LCSW since 11/29/2020 12:00 AM     Problem: Need for patient-centered plan of care to promote improved mental health and overal quality of life.   Priority: High  Long-Range Goal: Develop self management plan to improve pt's mental health and overall health/quality of life.   Start Date: 05/09/2020  Expected End Date: 01/24/2021  This Visit's Progress: On track  Recent Progress: On track  Priority: High  Note:   Current barriers:   Patient in need of assistance with connecting to community resources for Limited social support, Housing barriers, Mental Health Concerns, Family and relationship dysfunction, and lacks knowledge of community resource: housing. Acknowledges deficits with meeting this unmet need Patient is unable to independently navigate community resource options without care coordination support Patient with limited ability to complete applications for housing due to reported reading limitations Clinical Goals:   patient will work with SW to address concerns related to mental health support/needs patient will work with Care Guide  to address needs related to housing and other community needs as identified patient will follow up with Psychiatrist and Counselor to further discuss concerns and needs as directed by SW Clinical Interventions:  Pt  continues to seek placement and CSW advised her after review of her PT notes and collaborating with PCP; it is indicative of her not needing SNF/Nursing Home level of care. Pt understands and is planning to seek ALF or Independent Living options going forward. Pt also considering the [facility] options again.   CSW has alerted pt to the challenges faced seeking any sort of placement or housing due to the many variables and barriers faced- availability, cost/coverage, status of Medicaid, etc.   Collaboration with [name], MD regarding development and update of comprehensive plan of care as evidenced by provider attestation and co-signature Pt reports continued mental health support/sessions with her outpatient therapist and denies any current significant depression/anxiety. Pt denies SI/HI Inter-disciplinary care team collaboration (see longitudinal plan of care) Assessment of needs, barriers , agencies contacted, as well as how impacting  Review various resources, discussed options and provided patient information about Financial constraints related to housing, etc, Limited social support, Housing barriers, Mental Health Concerns , and Family and relationship dysfunction Collaborated with appropriate clinical care team members regarding patient needs Financial constraints related to housing, Limited social support, Housing barriers, Mental Health Concerns , and Family and relationship dysfunction, COPD, Anxiety, and Depression Patient interviewed and appropriate assessments performed Referred patient to community resources care guide team for assistance with  housing and other community support/resources as identified Provided mental health counseling with regard to pt concerns/needs (mental health diagnosis or concern) Discussed plans with patient for ongoing care management follow up and provided patient with direct contact information for care management team Other interventions provided: Motivational Interviewing Solution-Focused Strategies Brief CBT Emotional/Supportive Counseling Psychoeducation and/or Health Education Problem Solving Patient Goals:   - call 911 when I need emergency help - follow-up on any referrals for help I am given and seek housing options on my own in the local paper and community - think ahead to make sure my need does not become an emergency - make a note about what I need to have by the phone or take with me, like an identification card or social security number have a back-up plan - have a back-up plan - make a list of family or friends that I can call  -follow up with counselor and Psychiatrist at scheduled appointment times to further discuss mental health concerns/needs for more frequent visits as discussed -make Neurologist aware of concerns related to memory issues, recent stuttering,etc -consider seeing a Nutritionist/RD for healthier diet/choices  Follow Up Plan: Appointment scheduled for  SW follow up with client by phone on:  01/13/21      Follow Up Plan: Appointment scheduled for SW follow up with client by phone on: 01/13/21      Eduard Clos MSW, [place] Licensed Clinical Social Worker [place] [phone]

## 2020-11-29 NOTE — Telephone Encounter (Signed)
Staff message received from [place] stating to see if we can try to get pt scheduled for a mychart video visit to reevaluate to see if she is doing okay before prescribing more chantix.   Attempted to call pt to see if I could get her scheduled for a mychart video visit with SG today 10/5 but unable to reach. Left message for her to return call.

## 2020-11-30 ENCOUNTER — Telehealth (HOSPITAL_COMMUNITY): Payer: Self-pay | Admitting: Psychiatry

## 2020-11-30 ENCOUNTER — Telehealth: Payer: Self-pay | Admitting: Pharmacist

## 2020-11-30 ENCOUNTER — Telehealth: Payer: Self-pay | Admitting: Acute Care

## 2020-11-30 MED ORDER — BUSPIRONE HCL 5 MG PO TABS: 2.5000 mg | ORAL_TABLET | Freq: Two times a day (BID) | ORAL | 0 refills | Status: DC | PRN

## 2020-11-30 MED ORDER — BREZTRI AEROSPHERE 160-9-4.8 MCG/ACT IN AERO: 2.0000 | INHALATION_SPRAY | Freq: Two times a day (BID) | RESPIRATORY_TRACT | 0 refills | Status: DC

## 2020-11-30 NOTE — Telephone Encounter (Signed)
   Pt need refill  busPIRone (BUSPAR) 5 MG tablet  Please send to:  [id number] - [place],  - [address] AT [place]   But send the next one to   [facility]  ([place], [place]

## 2020-11-30 NOTE — Progress Notes (Signed)
Chronic Care Management Pharmacy Assistant   Name: Joan [name]  MRN: [id number] DOB: Jun 10, 1960    Reason for Encounter: Disease State   Conditions to be addressed/monitored: DMII   Recent office visits:  None ID  Recent consult visits:  11/22/20 [name], NP-Pulmonary Disease (COPD) Budeson-Glycopyrrol-Formoterol (BREZTRI AEROSPHERE) 160-9-4.8 MCG/ACT [facility] visits:  None in previous 6 months  Medications: Outpatient Encounter Medications as of 11/30/2020  Medication Sig   acyclovir (ZOVIRAX) 400 MG tablet TAKE 1 TABLET BY MOUTH  TWICE DAILY   albuterol (PROVENTIL) (2.5 MG/3ML) 0.083% nebulizer solution Take 3 mLs (2.5 mg total) by nebulization 2 (two) times daily. And every 6 hours as needed.  DX: J44.9   albuterol (VENTOLIN HFA) 108 (90 Base) MCG/ACT inhaler Inhale 2 puffs into the lungs every 6 (six) hours as needed for wheezing or shortness of breath.   atenolol (TENORMIN) 25 MG tablet Take 25 mg by mouth daily.   atorvastatin (LIPITOR) 20 MG tablet TAKE 1 TABLET BY MOUTH  DAILY   bisoprolol (ZEBETA) 5 MG tablet Take 1 tablet (5 mg total) by mouth daily.   Budeson-Glycopyrrol-Formoterol (BREZTRI AEROSPHERE) 160-9-4.8 MCG/ACT AERO Inhale 2 puffs into the lungs in the morning and at bedtime.   buPROPion (WELLBUTRIN XL) 150 MG 24 hr tablet TAKE 1 TABLET BY MOUTH  DAILY   busPIRone (BUSPAR) 5 MG tablet Take 0.5-1 tablets (2.5-5 mg total) by mouth 2 (two) times daily as needed.   Camphor-Menthol-Methyl Sal 3.03-02-08 % PTCH Apply 1 patch topically daily as needed (pain).   cyclobenzaprine (FLEXERIL) 10 MG tablet Take 10 mg by mouth at bedtime.   hydrochlorothiazide (HYDRODIURIL) 25 MG tablet TAKE 1 TABLET BY MOUTH  DAILY   hydrocortisone (ANUSOL-HC) 25 MG suppository Place 1 suppository (25 mg total) rectally 2 (two) times daily.   Ipratropium-Albuterol (COMBIVENT RESPIMAT) 20-100 MCG/ACT AERS respimat Inhale 1 puff into the lungs every 6 (six) hours as  needed for wheezing or shortness of breath.   levothyroxine (SYNTHROID) 25 MCG tablet TAKE 1 TABLET BY MOUTH  DAILY BEFORE BREAKFAST   Liniments (SALONPAS ARTHRITIS PAIN RELIEF EX) Apply 1 patch topically daily as needed (to painful sites- remove as directed).    metFORMIN (GLUCOPHAGE) 500 MG tablet Take 1 tablet (500 mg total) by mouth 2 (two) times daily with a meal.   montelukast (SINGULAIR) 10 MG tablet TAKE 1 TABLET BY MOUTH AT  BEDTIME   mupirocin cream (BACTROBAN) 2 % Apply topically daily.   naproxen (NAPROSYN) 500 MG tablet Take 1 tablet (500 mg total) by mouth 2 (two) times daily as needed for moderate pain.   nystatin-triamcinolone ointment (MYCOLOG) Apply 1 application topically 2 (two) times daily as needed (applied to skin folds/groins/under breast skin irritation rash.).   OXYGEN Inhale 2-3 L/min into the lungs See admin instructions. Inhale  2-3 L/min of oxygen into the lungs w/CPAP at bedtime and as needed for shortness of breath with "strenuous activity" during the day   pantoprazole (PROTONIX) 40 MG tablet TAKE 1 TABLET BY MOUTH  DAILY   PATADAY 0.2 % SOLN Apply 1 drop to eye daily.   polyethylene glycol (MIRALAX / GLYCOLAX) 17 g packet Take 17 g by mouth daily as needed for mild constipation.   pregabalin (LYRICA) 150 MG capsule TAKE 1 CAPSULE BY MOUTH AT  BEDTIME   QUEtiapine (SEROQUEL) 50 MG tablet TAKE 1 TABLET BY MOUTH AT  BEDTIME   roflumilast (DALIRESP) 500 MCG TABS tablet Take 1 tablet (500  mcg total) by mouth daily.   traZODone (DESYREL) 100 MG tablet TAKE 1 TABLET BY MOUTH AT  BEDTIME   varenicline (CHANTIX CONTINUING MONTH PAK) 1 MG tablet Take 1 tablet (1 mg total) by mouth 2 (two) times daily.   venlafaxine XR (EFFEXOR-XR) 150 MG 24 hr capsule TAKE 1 CAPSULE BY MOUTH  DAILY WITH BREAKFAST   witch hazel-glycerin (TUCKS) pad Apply topically as needed for itching.   No facility-administered encounter medications on file as of 11/30/2020.     Recent Relevant  Labs: Lab Results  Component Value Date/Time   HGBA1C 6.8 (H) 08/09/2020 05:37 AM   HGBA1C 6.9 (H) 08/08/2020 05:50 AM   MICROALBUR <0.7 12/17/2018 02:02 PM    Kidney Function Lab Results  Component Value Date/Time   CREATININE 0.88 08/15/2020 05:36 AM   CREATININE 0.86 08/13/2020 05:46 AM   CREATININE 1.18 (H) 10/28/2019 04:31 PM   GFR 55.45 (L) 03/07/2020 03:05 PM   GFRNONAA >60 08/15/2020 05:36 AM   GFRNONAA 51 (L) 10/28/2019 04:31 PM   GFRAA 59 (L) 10/28/2019 04:31 PM     Contacted [name] on 11/30/20 for general disease state and medication adherence call.   Patient is not > 5 days past due for refill on the following medications per chart history:  [facility] Medications: Medication Name/mg Last Fill Days Supply Atorvastatin 20 mg  09/10/20 90  What concerns do you have about your medications? No  The patient denies side effects with her medications.   How often do you forget or accidentally miss a dose?  Patient states so does miss some doses at time  Do you use a pillbox? No  Are you having any problems getting your medications from your pharmacy? No  Has the cost of your medications been a concern? Yes If yes, what medication and is patient assistance available or has it been applied for?patient states that her chantix is to expensive for her $48 month. Also patient would like to see if she can get patient assistance for breztri  Since last visit with CPP, no interventions have been made:   The patient has not had an ED visit since last contact.   The patient denies problems with their health.   she denies  concerns or questions for [employer], Pharm. D at this time.     Care Gaps: Annual wellness visit in last year? No Most Recent BP reading and date:120/72, 11/22/20  If Diabetic: Most recent A1C reading:6.8, 08/09/20 Last eye exam / retinopathy screening:None noted Last diabetic foot exam:03/07/20  PCP appointment on 12/19/20    [place] Pharmacist Assistant [phone]

## 2020-11-30 NOTE — Telephone Encounter (Signed)
sent 

## 2020-11-30 NOTE — Telephone Encounter (Signed)
Call made to patient, confirmed DOB. Requesting samples of Breztri.    With samples low in office we are unable to give her a sample at this time.   30 day script sent to pharmacy.   Patient assistance application printed and mailed to patient.   Nothing further needed at this time.

## 2020-12-01 DIAGNOSIS — E119 Type 2 diabetes mellitus without complications: Secondary | ICD-10-CM | POA: Diagnosis not present

## 2020-12-01 DIAGNOSIS — M6281 Muscle weakness (generalized): Secondary | ICD-10-CM | POA: Diagnosis not present

## 2020-12-01 DIAGNOSIS — F17210 Nicotine dependence, cigarettes, uncomplicated: Secondary | ICD-10-CM | POA: Diagnosis not present

## 2020-12-01 DIAGNOSIS — J441 Chronic obstructive pulmonary disease with (acute) exacerbation: Secondary | ICD-10-CM | POA: Diagnosis not present

## 2020-12-01 DIAGNOSIS — F32A Depression, unspecified: Secondary | ICD-10-CM | POA: Diagnosis not present

## 2020-12-01 DIAGNOSIS — Z741 Need for assistance with personal care: Secondary | ICD-10-CM | POA: Diagnosis not present

## 2020-12-01 DIAGNOSIS — K219 Gastro-esophageal reflux disease without esophagitis: Secondary | ICD-10-CM | POA: Diagnosis not present

## 2020-12-01 DIAGNOSIS — E785 Hyperlipidemia, unspecified: Secondary | ICD-10-CM | POA: Diagnosis not present

## 2020-12-01 DIAGNOSIS — Z9981 Dependence on supplemental oxygen: Secondary | ICD-10-CM | POA: Diagnosis not present

## 2020-12-04 DIAGNOSIS — F17210 Nicotine dependence, cigarettes, uncomplicated: Secondary | ICD-10-CM | POA: Diagnosis not present

## 2020-12-04 DIAGNOSIS — K219 Gastro-esophageal reflux disease without esophagitis: Secondary | ICD-10-CM | POA: Diagnosis not present

## 2020-12-04 DIAGNOSIS — J441 Chronic obstructive pulmonary disease with (acute) exacerbation: Secondary | ICD-10-CM | POA: Diagnosis not present

## 2020-12-04 DIAGNOSIS — E119 Type 2 diabetes mellitus without complications: Secondary | ICD-10-CM | POA: Diagnosis not present

## 2020-12-04 DIAGNOSIS — F32A Depression, unspecified: Secondary | ICD-10-CM | POA: Diagnosis not present

## 2020-12-04 DIAGNOSIS — Z741 Need for assistance with personal care: Secondary | ICD-10-CM | POA: Diagnosis not present

## 2020-12-04 DIAGNOSIS — M6281 Muscle weakness (generalized): Secondary | ICD-10-CM | POA: Diagnosis not present

## 2020-12-04 DIAGNOSIS — Z9981 Dependence on supplemental oxygen: Secondary | ICD-10-CM | POA: Diagnosis not present

## 2020-12-04 DIAGNOSIS — E785 Hyperlipidemia, unspecified: Secondary | ICD-10-CM | POA: Diagnosis not present

## 2020-12-04 NOTE — Chronic Care Management (AMB) (Signed)
  Care Management   Note  12/04/2020 Name: [name] MRN: [id number] DOB: May 08, 1960  Joan [name] is a 60 y.o. year old female who is a primary care patient of [name], MD and is actively engaged with the care management team. I reached out to [name] by phone today to assist with re-scheduling a follow up visit with the RN Case Manager  Follow up plan: Telephone appointment with care management team member scheduled for:12/25/20  [facility] Management  Direct Dial: [phone]

## 2020-12-05 ENCOUNTER — Other Ambulatory Visit: Payer: Self-pay

## 2020-12-05 ENCOUNTER — Ambulatory Visit: Payer: Medicare Other

## 2020-12-05 DIAGNOSIS — J449 Chronic obstructive pulmonary disease, unspecified: Secondary | ICD-10-CM

## 2020-12-05 DIAGNOSIS — J9611 Chronic respiratory failure with hypoxia: Secondary | ICD-10-CM

## 2020-12-05 NOTE — Progress Notes (Signed)
   Chronic Care Management Pharmacy Note  12/05/2020 Name:  Joan [name] MRN:  [id number] DOB:  1960/05/16  Attempted to call patient for follow up appointment today, patient states that she is unable to have appointment at this time as she is at another appointment - unable to update medication profile, requesting follow up appointment at a later date  Patient does note that she is having issues with cost of her breztri inhaler, appears pulmonology has mailed patient application but she has not received yet, will email another application for patient to start process of [place]   Will update patient once PAP is submitted and company has responded   [name], PharmD Clinical Pharmacist, [facility]

## 2020-12-06 DIAGNOSIS — F17210 Nicotine dependence, cigarettes, uncomplicated: Secondary | ICD-10-CM | POA: Diagnosis not present

## 2020-12-06 DIAGNOSIS — E119 Type 2 diabetes mellitus without complications: Secondary | ICD-10-CM | POA: Diagnosis not present

## 2020-12-06 DIAGNOSIS — Z9981 Dependence on supplemental oxygen: Secondary | ICD-10-CM | POA: Diagnosis not present

## 2020-12-06 DIAGNOSIS — J441 Chronic obstructive pulmonary disease with (acute) exacerbation: Secondary | ICD-10-CM | POA: Diagnosis not present

## 2020-12-06 DIAGNOSIS — Z741 Need for assistance with personal care: Secondary | ICD-10-CM | POA: Diagnosis not present

## 2020-12-06 DIAGNOSIS — E785 Hyperlipidemia, unspecified: Secondary | ICD-10-CM | POA: Diagnosis not present

## 2020-12-06 DIAGNOSIS — F32A Depression, unspecified: Secondary | ICD-10-CM | POA: Diagnosis not present

## 2020-12-06 DIAGNOSIS — M6281 Muscle weakness (generalized): Secondary | ICD-10-CM | POA: Diagnosis not present

## 2020-12-06 DIAGNOSIS — K219 Gastro-esophageal reflux disease without esophagitis: Secondary | ICD-10-CM | POA: Diagnosis not present

## 2020-12-08 ENCOUNTER — Telehealth: Payer: Self-pay | Admitting: Pulmonary Disease

## 2020-12-08 DIAGNOSIS — J449 Chronic obstructive pulmonary disease, unspecified: Secondary | ICD-10-CM | POA: Diagnosis not present

## 2020-12-08 DIAGNOSIS — J441 Chronic obstructive pulmonary disease with (acute) exacerbation: Secondary | ICD-10-CM | POA: Diagnosis not present

## 2020-12-08 DIAGNOSIS — J9611 Chronic respiratory failure with hypoxia: Secondary | ICD-10-CM | POA: Diagnosis not present

## 2020-12-08 NOTE — Telephone Encounter (Signed)
Received pt assistance form from [facility] and MW for RA to sign for pt's Breztri (1 page received) Placed in Dr Bari Mantis looakt  Please let me know once this is signed

## 2020-12-13 DIAGNOSIS — E119 Type 2 diabetes mellitus without complications: Secondary | ICD-10-CM | POA: Diagnosis not present

## 2020-12-13 DIAGNOSIS — Z741 Need for assistance with personal care: Secondary | ICD-10-CM | POA: Diagnosis not present

## 2020-12-13 DIAGNOSIS — E785 Hyperlipidemia, unspecified: Secondary | ICD-10-CM | POA: Diagnosis not present

## 2020-12-13 DIAGNOSIS — M6281 Muscle weakness (generalized): Secondary | ICD-10-CM | POA: Diagnosis not present

## 2020-12-13 DIAGNOSIS — F17210 Nicotine dependence, cigarettes, uncomplicated: Secondary | ICD-10-CM | POA: Diagnosis not present

## 2020-12-13 DIAGNOSIS — K219 Gastro-esophageal reflux disease without esophagitis: Secondary | ICD-10-CM | POA: Diagnosis not present

## 2020-12-13 DIAGNOSIS — F32A Depression, unspecified: Secondary | ICD-10-CM | POA: Diagnosis not present

## 2020-12-13 DIAGNOSIS — Z9981 Dependence on supplemental oxygen: Secondary | ICD-10-CM | POA: Diagnosis not present

## 2020-12-13 DIAGNOSIS — J441 Chronic obstructive pulmonary disease with (acute) exacerbation: Secondary | ICD-10-CM | POA: Diagnosis not present

## 2020-12-13 NOTE — Telephone Encounter (Signed)
Pt was able to get a f/u appt scheduled with SG. Closing encounter.

## 2020-12-14 ENCOUNTER — Ambulatory Visit: Payer: Medicare Other | Admitting: *Deleted

## 2020-12-14 ENCOUNTER — Ambulatory Visit (HOSPITAL_COMMUNITY): Payer: Medicare Other | Admitting: Licensed Clinical Social Worker

## 2020-12-14 DIAGNOSIS — F32A Depression, unspecified: Secondary | ICD-10-CM

## 2020-12-14 DIAGNOSIS — R443 Hallucinations, unspecified: Secondary | ICD-10-CM

## 2020-12-14 DIAGNOSIS — F0630 Mood disorder due to known physiological condition, unspecified: Secondary | ICD-10-CM

## 2020-12-14 DIAGNOSIS — J449 Chronic obstructive pulmonary disease, unspecified: Secondary | ICD-10-CM

## 2020-12-14 DIAGNOSIS — F419 Anxiety disorder, unspecified: Secondary | ICD-10-CM

## 2020-12-14 DIAGNOSIS — F411 Generalized anxiety disorder: Secondary | ICD-10-CM

## 2020-12-14 DIAGNOSIS — E118 Type 2 diabetes mellitus with unspecified complications: Secondary | ICD-10-CM

## 2020-12-14 DIAGNOSIS — F4310 Post-traumatic stress disorder, unspecified: Secondary | ICD-10-CM

## 2020-12-14 DIAGNOSIS — I10 Essential (primary) hypertension: Secondary | ICD-10-CM

## 2020-12-14 NOTE — Progress Notes (Signed)
Therapist contacted patient through email and she did not respond. Session is a no show.

## 2020-12-14 NOTE — Chronic Care Management (AMB) (Signed)
Chronic Care Management    Clinical Social Work Note  12/14/2020 Name: Joan [name] MRN: [id number] DOB: 10-30-1960  Joan [name] is a 60 y.o. year old female who is a primary care patient of [name], MD. The CCM team was consulted to assist the patient with chronic disease management and/or care coordination needs related to: Level of Care Concerns.   Engaged with patient by telephone for follow up visit in response to provider referral for social work chronic care management and care coordination services.   Consent to Services:  The patient was given information about Chronic Care Management services, agreed to services, and gave verbal consent prior to initiation of services.  Please see initial visit note for detailed documentation.   Patient agreed to services and consent obtained.   Assessment: Review of patient past medical history, allergies, medications, and health status, including review of relevant consultants reports was performed today as part of a comprehensive evaluation and provision of chronic care management and care coordination services.     SDOH (Social Determinants of Health) assessments and interventions performed:  SDOH Interventions    Flowsheet Row Most Recent Value  SDOH Interventions   Housing Interventions Other (Comment)  [Living with her brother and plans to move to [place], [place] next month to her own place]        Advanced Directives Status: Not addressed in this encounter.  CCM Care Plan  Allergies  Allergen Reactions   Keflex [Cephalexin] Rash and Other (See Comments)    Has tolerated amoxicillin since had keflex   Prednisone Other (See Comments)    "counter reacts"   Shellfish Allergy Shortness Of Breath, Nausea And Vomiting and Other (See Comments)    Stomach cramps   Tetracyclines & Related Anaphylaxis, Swelling and Other (See Comments)    Throat swelling requiring hospitalization   Dilaudid [Hydromorphone  Hcl] Other (See Comments)    "Lethargy"   Incruse Ellipta [Umeclidinium Bromide] Nausea Only   Other    Tuberculin Tests Other (See Comments)    False postive    Outpatient Encounter Medications as of 12/14/2020  Medication Sig   acyclovir (ZOVIRAX) 400 MG tablet TAKE 1 TABLET BY MOUTH  TWICE DAILY   albuterol (PROVENTIL) (2.5 MG/3ML) 0.083% nebulizer solution Take 3 mLs (2.5 mg total) by nebulization 2 (two) times daily. And every 6 hours as needed.  DX: J44.9   albuterol (VENTOLIN HFA) 108 (90 Base) MCG/ACT inhaler Inhale 2 puffs into the lungs every 6 (six) hours as needed for wheezing or shortness of breath.   atenolol (TENORMIN) 25 MG tablet Take 25 mg by mouth daily.   atorvastatin (LIPITOR) 20 MG tablet TAKE 1 TABLET BY MOUTH  DAILY   bisoprolol (ZEBETA) 5 MG tablet Take 1 tablet (5 mg total) by mouth daily.   Budeson-Glycopyrrol-Formoterol (BREZTRI AEROSPHERE) 160-9-4.8 MCG/ACT AERO Inhale 2 puffs into the lungs in the morning and at bedtime.   buPROPion (WELLBUTRIN XL) 150 MG 24 hr tablet TAKE 1 TABLET BY MOUTH  DAILY   busPIRone (BUSPAR) 5 MG tablet Take 0.5-1 tablets (2.5-5 mg total) by mouth 2 (two) times daily as needed.   Camphor-Menthol-Methyl Sal 3.03-02-08 % PTCH Apply 1 patch topically daily as needed (pain).   cyclobenzaprine (FLEXERIL) 10 MG tablet Take 10 mg by mouth at bedtime.   hydrochlorothiazide (HYDRODIURIL) 25 MG tablet TAKE 1 TABLET BY MOUTH  DAILY   hydrocortisone (ANUSOL-HC) 25 MG suppository Place 1 suppository (25 mg total) rectally 2 (two) times  daily.   Ipratropium-Albuterol (COMBIVENT RESPIMAT) 20-100 MCG/ACT AERS respimat Inhale 1 puff into the lungs every 6 (six) hours as needed for wheezing or shortness of breath.   levothyroxine (SYNTHROID) 25 MCG tablet TAKE 1 TABLET BY MOUTH  DAILY BEFORE BREAKFAST   Liniments (SALONPAS ARTHRITIS PAIN RELIEF EX) Apply 1 patch topically daily as needed (to painful sites- remove as directed).    metFORMIN (GLUCOPHAGE)  500 MG tablet Take 1 tablet (500 mg total) by mouth 2 (two) times daily with a meal.   montelukast (SINGULAIR) 10 MG tablet TAKE 1 TABLET BY MOUTH AT  BEDTIME   mupirocin cream (BACTROBAN) 2 % Apply topically daily.   naproxen (NAPROSYN) 500 MG tablet Take 1 tablet (500 mg total) by mouth 2 (two) times daily as needed for moderate pain.   nystatin-triamcinolone ointment (MYCOLOG) Apply 1 application topically 2 (two) times daily as needed (applied to skin folds/groins/under breast skin irritation rash.).   OXYGEN Inhale 2-3 L/min into the lungs See admin instructions. Inhale  2-3 L/min of oxygen into the lungs w/CPAP at bedtime and as needed for shortness of breath with "strenuous activity" during the day   pantoprazole (PROTONIX) 40 MG tablet TAKE 1 TABLET BY MOUTH  DAILY   PATADAY 0.2 % SOLN Apply 1 drop to eye daily.   polyethylene glycol (MIRALAX / GLYCOLAX) 17 g packet Take 17 g by mouth daily as needed for mild constipation.   pregabalin (LYRICA) 150 MG capsule TAKE 1 CAPSULE BY MOUTH AT  BEDTIME   QUEtiapine (SEROQUEL) 50 MG tablet TAKE 1 TABLET BY MOUTH AT  BEDTIME   roflumilast (DALIRESP) 500 MCG TABS tablet Take 1 tablet (500 mcg total) by mouth daily.   traZODone (DESYREL) 100 MG tablet TAKE 1 TABLET BY MOUTH AT  BEDTIME   varenicline (CHANTIX CONTINUING MONTH PAK) 1 MG tablet Take 1 tablet (1 mg total) by mouth 2 (two) times daily.   venlafaxine XR (EFFEXOR-XR) 150 MG 24 hr capsule TAKE 1 CAPSULE BY MOUTH  DAILY WITH BREAKFAST   witch hazel-glycerin (TUCKS) pad Apply topically as needed for itching.   No facility-administered encounter medications on file as of 12/14/2020.    Patient Active Problem List   Diagnosis Date Noted   Depression 08/16/2020   PTSD (post-traumatic stress disorder) 02/09/2020   Mood disorder in conditions classified elsewhere 02/09/2020   Generalized anxiety disorder 02/09/2020   Type II diabetes mellitus with manifestations ([place]) 10/29/2019   Coarse  tremors 10/28/2019   Chronic rhinitis 10/21/2019   Allergic rhinitis 06/16/2019   Cervical spondylosis without myelopathy 12/18/2018   Hypertension    Encounter for general adult medical examination with abnormal findings 11/06/2018   Smokers' cough ([place]) 01/15/2018   Hypomagnesemia 12/29/2017   Pulmonary nodule 11/11/2017   Carotid artery disease ([place]) 11/05/2017   OSA on CPAP 08/07/2017   Gastroesophageal reflux disease 06/23/2017   Hypothyroidism due to acquired atrophy of thyroid 04/05/2017   Primary osteoarthritis involving multiple joints 04/05/2017   Chronic constipation 02/10/2017   Iron deficiency anemia due to dietary causes 02/10/2017   Osteoarthritis 02/10/2017   Hyperlipidemia associated with type 2 diabetes mellitus ([place]) 02/10/2017   Chronic respiratory failure with hypoxia ([place]) 12/16/2016   Essential hypertension 11/29/2016   Vocal cord dysfunction    COPD, severe ([place]) 09/24/2016   Anxiety 09/24/2016    Conditions to be addressed/monitored: COPD; Housing barriers and Level of care concerns  Care Plan : [place]  Updates made by Deirdre Peer,  LCSW since 12/14/2020 12:00 AM     Problem: Need for patient-centered plan of care to promote improved mental health and overal quality of life.   Priority: High     Long-Range Goal: Develop self management plan to improve pt's mental health and overall health/quality of life.   Start Date: 05/09/2020  Expected End Date: 01/24/2021  This Visit's Progress: On track  Recent Progress: On track  Priority: High  Note:   Current barriers:   Patient in need of assistance with connecting to community resources for Limited social support, Housing barriers, Mental Health Concerns, Family and relationship dysfunction, and lacks knowledge of community resource: housing. Acknowledges deficits with meeting this unmet need Patient is unable to independently navigate community resource options without care coordination  support Patient with limited ability to complete applications for housing due to reported reading limitations Clinical Goals:  patient will work with SW to address concerns related to mental health support/needs patient will work with Care Guide  to address needs related to housing and other community needs as identified patient will follow up with Psychiatrist and Counselor to further discuss concerns and needs as directed by SW Clinical Interventions:  Pt reports to CSW that the [place] rep has helped her to secure a new place to live in the [place] area and she is beginning to pack for the move.  Pt is excited; likes the area and feels good about it.  CSW reminded pt to make sure to re-establish with medical Providers before the move so she is connected.  Pt wants to ask her local Providers for suggestions/referrals.   CSW commended pt for her energy and hard work to see this throughWarehouse manager with [name], MD regarding development and update of comprehensive plan of care as evidenced by provider attestation and co-signature Pt reports continued mental health support/sessions with her outpatient therapist and denies any current significant depression/anxiety. Pt denies SI/HI Inter-disciplinary care team collaboration (see longitudinal plan of care) Assessment of needs, barriers , agencies contacted, as well as how impacting  Review various resources, discussed options and provided patient information about Financial constraints related to housing, etc, Limited social support, Housing barriers, Mental Health Concerns , and Family and relationship dysfunction Collaborated with appropriate clinical care team members regarding patient needs Financial constraints related to housing, Limited social support, Housing barriers, Mental Health Concerns , and Family and relationship dysfunction, COPD, Anxiety, and Depression Patient interviewed and appropriate assessments  performed Referred patient to community resources care guide team for assistance with housing and other community support/resources as identified Provided mental health counseling with regard to pt concerns/needs (mental health diagnosis or concern) Discussed plans with patient for ongoing care management follow up and provided patient with direct contact information for care management team Other interventions provided: Motivational Interviewing Solution-Focused Strategies Brief CBT Emotional/Supportive Counseling Psychoeducation and/or Health Education Problem Solving Patient Goals:   - call 911 when I need emergency help - follow-up on any referrals for help I am given and seek housing options on my own in the local paper and community - think ahead to make sure my need does not become an emergency - make a note about what I need to have by the phone or take with me, like an identification card or social security number have a back-up plan - have a back-up plan - make a list of family or friends that I can call  -follow up with counselor and Psychiatrist at scheduled  appointment times to further discuss mental health concerns/needs for more frequent visits as discussed -make Neurologist aware of concerns related to memory issues, recent stuttering,etc -consider seeing a Nutritionist/RD for healthier diet/choices  Follow Up Plan: Appointment scheduled for SW follow up with client by phone on:  01/13/21      Follow Up Plan: Appointment scheduled for SW follow up with client by phone on: [place] MSW, [place] Licensed Clinical Social Worker [place] [phone]

## 2020-12-14 NOTE — Patient Instructions (Signed)
Visit Information  PATIENT GOALS:  Goals Addressed   None     The patient verbalized understanding of instructions, educational materials, and care plan provided today and declined offer to receive copy of patient instructions, educational materials, and care plan.   Telephone follow up appointment with care management team member scheduled for:01/12/21 Eduard Clos MSW, LCSW Licensed Clinical Social Worker [place] [phone]

## 2020-12-15 ENCOUNTER — Telehealth: Payer: Self-pay | Admitting: Pulmonary Disease

## 2020-12-18 NOTE — Telephone Encounter (Signed)
Called and spoke with Patient.  Patient had questions about provider completing parts of patient assistance form.  Advised Patient to complete Patient information and drop form off at front desk.  Advised Patient provider would complete informations requested and we would fax enrollment to [employer].  Understanding stated. Nothing further at this time.

## 2020-12-19 ENCOUNTER — Other Ambulatory Visit: Payer: Self-pay

## 2020-12-19 ENCOUNTER — Ambulatory Visit (INDEPENDENT_AMBULATORY_CARE_PROVIDER_SITE_OTHER): Payer: Medicare Other | Admitting: Internal Medicine

## 2020-12-19 ENCOUNTER — Encounter: Payer: Self-pay | Admitting: Internal Medicine

## 2020-12-19 ENCOUNTER — Telehealth (INDEPENDENT_AMBULATORY_CARE_PROVIDER_SITE_OTHER): Payer: Medicare Other | Admitting: Psychiatry

## 2020-12-19 ENCOUNTER — Encounter (HOSPITAL_COMMUNITY): Payer: Self-pay | Admitting: Psychiatry

## 2020-12-19 ENCOUNTER — Other Ambulatory Visit (HOSPITAL_COMMUNITY): Payer: Self-pay | Admitting: Psychiatry

## 2020-12-19 DIAGNOSIS — Z9981 Dependence on supplemental oxygen: Secondary | ICD-10-CM | POA: Diagnosis not present

## 2020-12-19 DIAGNOSIS — F4310 Post-traumatic stress disorder, unspecified: Secondary | ICD-10-CM | POA: Diagnosis not present

## 2020-12-19 DIAGNOSIS — M6281 Muscle weakness (generalized): Secondary | ICD-10-CM | POA: Diagnosis not present

## 2020-12-19 DIAGNOSIS — J9611 Chronic respiratory failure with hypoxia: Secondary | ICD-10-CM | POA: Diagnosis not present

## 2020-12-19 DIAGNOSIS — F17210 Nicotine dependence, cigarettes, uncomplicated: Secondary | ICD-10-CM | POA: Diagnosis not present

## 2020-12-19 DIAGNOSIS — F0630 Mood disorder due to known physiological condition, unspecified: Secondary | ICD-10-CM

## 2020-12-19 DIAGNOSIS — F32A Depression, unspecified: Secondary | ICD-10-CM | POA: Diagnosis not present

## 2020-12-19 DIAGNOSIS — Z741 Need for assistance with personal care: Secondary | ICD-10-CM | POA: Diagnosis not present

## 2020-12-19 DIAGNOSIS — F411 Generalized anxiety disorder: Secondary | ICD-10-CM | POA: Diagnosis not present

## 2020-12-19 DIAGNOSIS — K219 Gastro-esophageal reflux disease without esophagitis: Secondary | ICD-10-CM | POA: Diagnosis not present

## 2020-12-19 DIAGNOSIS — J441 Chronic obstructive pulmonary disease with (acute) exacerbation: Secondary | ICD-10-CM | POA: Diagnosis not present

## 2020-12-19 DIAGNOSIS — E785 Hyperlipidemia, unspecified: Secondary | ICD-10-CM | POA: Diagnosis not present

## 2020-12-19 DIAGNOSIS — E119 Type 2 diabetes mellitus without complications: Secondary | ICD-10-CM | POA: Diagnosis not present

## 2020-12-19 MED ORDER — BUSPIRONE HCL 5 MG PO TABS: 2.5000 mg | ORAL_TABLET | Freq: Two times a day (BID) | ORAL | 0 refills | Status: DC | PRN

## 2020-12-19 NOTE — Progress Notes (Signed)
[place] Follow up visit  Patient Identification: Joan [name] MRN:  [id number] Date of Evaluation:  12/19/2020 Referral Source:Therapist [name] Chief Complaint:    depression follow up  Visit Diagnosis:    ICD-10-CM   1. Mood disorder in conditions classified elsewhere  F06.30     2. Generalized anxiety disorder  F41.1     3. PTSD (post-traumatic stress disorder)  F43.10      Virtual Visit via Video Note  I connected with [name] on 12/19/20 at  3:30 PM EDT by a video enabled telemedicine application and verified that I am speaking with the correct person using two identifiers.  Location: Patient: home Provider: office   I discussed the limitations of evaluation and management by telemedicine and the availability of in person appointments. The patient expressed understanding and agreed to proceed.     I discussed the assessment and treatment plan with the patient. The patient was provided an opportunity to ask questions and all were answered. The patient agreed with the plan and demonstrated an understanding of the instructions.   The patient was advised to call back or seek an in-person evaluation if the symptoms worsen or if the condition fails to improve as anticipated.  I provided 15 minutes of non-face-to-face time during this encounter.      History of Present Illness: Patient is a 60  years old currently widow Caucasian female initially referred by her therapist for management of mood symptoms, depression possible PTSD and anxiety  Patient was in rehab after being admitted for COPD exacerbation  She struggles with lonliness, has found assisted living place, she is a widow of veteran and [facility] may help with some finances She looking forward to stay there Doing otherwise manageable and does therapy with [facility]  Aggravating factor; difficult childhood, grief, difficult relationship with middle kid, hip replacement medical comorbidity Modifying  factor:  siblings  Duration adult life      Past Psychiatric History: depression, drug use  Previous Psychotropic Medications: Yes   Substance Abuse History in the last 12 months:  No.  Consequences of Substance Abuse: history of extensive drug use uptil 8 years ago and have been in rehab programs  Past Medical History:  Past Medical History:  Diagnosis Date   Acute respiratory failure with hypoxia ([place])    Alcoholism and drug addiction in family    Anxiety    Appendicitis    Arthritis    "knees, hips, lower back" (09/25/2016)   Asthma    Bipolar disorder ([place])    Chronic bronchitis ([place])    "all the time" (09/25/2016)   Chronic leg pain    RLE   Chronic lower back pain    COPD (chronic obstructive pulmonary disease) ([place])    Depression    Diabetes mellitus without complication ([place])    Diabetic neuropathy ([place])    Early cataracts, bilateral    Emphysema of lung ([place])    Generalized anxiety disorder 02/09/2020   GERD (gastroesophageal reflux disease)    Headache    Hepatitis C    "treated back in 2001-2002"   High cholesterol    Hypertension    Hypothyroidism    Incarcerated ventral hernia 04/04/2017   Mood disorder in conditions classified elsewhere 02/09/2020   On home oxygen therapy    "2L; at nighttime" (04/04/2017)   OSA on CPAP    CPAP with Oxygen 2 liters   Paranoid ([place])    Pneumonia    "all the time" (  09/25/2016)   Positive PPD    with negative chest x ray   PTSD (post-traumatic stress disorder) 02/09/2020   Tachycardia    patient reports with exertion ; denies any other conditions    Thyroid disease    Wears glasses    Wears partial dentures     Past Surgical History:  Procedure Laterality Date   BACK SURGERY     BIOPSY  11/24/2018   Procedure: BIOPSY;  Surgeon: Thornton Park, MD;  Location: [facility];  Service: Gastroenterology;;   CARDIAC CATHETERIZATION     COLONOSCOPY     COLONOSCOPY WITH PROPOFOL N/A 11/24/2018   Procedure:  COLONOSCOPY WITH PROPOFOL;  Surgeon: Thornton Park, MD;  [facility];  Service: Gastroenterology;  Laterality: N/A;   CYST EXCISION     removal of cyst in sinuses   DILATION AND CURETTAGE OF UTERUS     ESOPHAGOGASTRODUODENOSCOPY (EGD) WITH PROPOFOL N/A 11/24/2018   Procedure: ESOPHAGOGASTRODUODENOSCOPY (EGD) WITH PROPOFOL;  Surgeon: Thornton Park, MD;  [facility];  Service: Gastroenterology;  Laterality: N/A;   FOOT SURGERY Bilateral    bone removed from 4th and 5 th toes and put in pin then took pin out   [place]  ~ 2014/2015   "removed mesh & infection"   [place] N/A 04/04/2017   Procedure: LAPAROSCOPIC INCISIONAL HERNIA REPAIR WITH MESH;  Surgeon: Fanny Skates, MD;  Location: [facility];  Service: General;  Laterality: N/A;   LACERATION REPAIR Right    repair wrist artery from laceration from ice skate   LAPAROSCOPIC APPENDECTOMY N/A 03/31/2019   Procedure: APPENDECTOMY LAPAROSCOPIC;  Surgeon: Ralene Ok, MD;  Location: [facility];  Service: General;  Laterality: N/A;   LAPAROSCOPIC APPENDECTOMY N/A 07/09/2019   Procedure: APPENDECTOMY LAPAROSCOPIC;  Surgeon: Ralene Ok, MD;  Location: [facility];  Service: General;  Laterality: N/A;   LAPAROSCOPIC INCISIONAL / UMBILICAL / [place]  04/04/2017   LAPAROSCOPIC INCISIONAL HERNIA REPAIR WITH MESH   LIPOMA EXCISION Right    fattty lipoma in neck   NASAL SEPTUM SURGERY     POSTERIOR LUMBAR FUSION  2015/2016 X 2   "added rods and screws"   SINOSCOPY     TOTAL HIP ARTHROPLASTY Right 02/05/2017   Procedure: RIGHT TOTAL HIP ARTHROPLASTY;  Surgeon: [name], MD;  [facility];  Service: Orthopedics;  Laterality: Right;   TOTAL HIP ARTHROPLASTY Left 06/18/2017   Procedure: LEFT TOTAL HIP ARTHROPLASTY;  Surgeon: [name], MD;  [facility];  Service: Orthopedics;  Laterality: Left;   TOTAL HIP REVISION Left 01/26/2019   Procedure: TOTAL  HIP REVISION;  Surgeon: Paralee Cancel, MD;  Location[facility];  Service: Orthopedics;  Laterality: Left;  2 hrs   TUBAL LIGATION     UMBILICAL HERNIA REPAIR  ~ 2013   w/mesh   [facility]      Family Psychiatric History: FAther : alcohol use  Family History:  Family History  Problem Relation Age of Onset   Diabetes Mother    Hypertension Mother    Hypertension Father    Cancer Father        lung    Social History:   Social History   Socioeconomic History   Marital status: Widowed    Spouse name: Not on file   Number of children: 3   Years of education: Not on file   Highest education level: Not on file  Occupational History   Not  on file  Tobacco Use   Smoking status: Every Day    Packs/day: 0.50    Years: 44.00    Pack years: 22.00    Types: Cigarettes   Smokeless tobacco: Never   Tobacco comments:    down to 0.5ppd 06/30/20/jm  Vaping Use   Vaping Use: Never used  Substance and Sexual Activity   Alcohol use: Not Currently   Drug use: Not Currently    Types: "Crack" cocaine    Comment:  "nothing since 01/12/2012"   Sexual activity: Not Currently    Partners: Male  Other Topics Concern   Not on file  Social History Narrative   Not on file   Social Determinants of Health   Financial Resource Strain: High Risk   Difficulty of Paying Living Expenses: Hard  Food Insecurity: Food Insecurity Present   Worried About Running Out of Food in the Last Year: Sometimes true   Ran Out of Food in the Last Year: Sometimes true  Transportation Needs: No Transportation Needs   Lack of Transportation (Medical): No   Lack of Transportation (Non-Medical): No  Physical Activity: Not on file  Stress: Stress Concern Present   Feeling of Stress : Very much  Social Connections: Not on file      Allergies:   Allergies  Allergen Reactions   Keflex [Cephalexin] Rash and Other (See Comments)    Has tolerated amoxicillin since had keflex   Prednisone Other (See  Comments)    "counter reacts"   Shellfish Allergy Shortness Of Breath, Nausea And Vomiting and Other (See Comments)    Stomach cramps   Tetracyclines & Related Anaphylaxis, Swelling and Other (See Comments)    Throat swelling requiring hospitalization   Dilaudid [Hydromorphone Hcl] Other (See Comments)    "Lethargy"   Incruse Ellipta [Umeclidinium Bromide] Nausea Only   Other    Tuberculin Tests Other (See Comments)    False postive    Metabolic Disorder Labs: Lab Results  Component Value Date   HGBA1C 6.8 (H) 08/09/2020   MPG 148 08/09/2020   MPG 151 08/08/2020   No results found for: PROLACTIN Lab Results  Component Value Date   CHOL 141 03/07/2020   TRIG 143.0 03/07/2020   HDL 54.30 03/07/2020   CHOLHDL 3 03/07/2020   VLDL 28.6 03/07/2020   LDLCALC 58 03/07/2020   LDLCALC 62 08/19/2017   Lab Results  Component Value Date   TSH 0.89 10/28/2019    Therapeutic Level Labs: No results found for: LITHIUM No results found for: CBMZ No results found for: VALPROATE  Current Medications: Current Outpatient Medications  Medication Sig Dispense Refill   acyclovir (ZOVIRAX) 400 MG tablet TAKE 1 TABLET BY MOUTH  TWICE DAILY 180 tablet 1   albuterol (PROVENTIL) (2.5 MG/3ML) 0.083% nebulizer solution Take 3 mLs (2.5 mg total) by nebulization 2 (two) times daily. And every 6 hours as needed.  DX: J44.9 360 mL 3   atenolol (TENORMIN) 25 MG tablet Take 25 mg by mouth daily.     atorvastatin (LIPITOR) 20 MG tablet TAKE 1 TABLET BY MOUTH  DAILY 90 tablet 3   bisoprolol (ZEBETA) 5 MG tablet Take 1 tablet (5 mg total) by mouth daily.     Budeson-Glycopyrrol-Formoterol (BREZTRI AEROSPHERE) 160-9-4.8 MCG/ACT AERO Inhale 2 puffs into the lungs in the morning and at bedtime. 5.9 g 0   buPROPion (WELLBUTRIN XL) 150 MG 24 hr tablet TAKE 1 TABLET BY MOUTH  DAILY 30 tablet 0   busPIRone (BUSPAR) 5  MG tablet Take 0.5-1 tablets (2.5-5 mg total) by mouth 2 (two) times daily as needed. 60 tablet  0   Camphor-Menthol-Methyl Sal 3.03-02-08 % PTCH Apply 1 patch topically daily as needed (pain).     cyclobenzaprine (FLEXERIL) 10 MG tablet Take 10 mg by mouth at bedtime.     hydrochlorothiazide (HYDRODIURIL) 25 MG tablet TAKE 1 TABLET BY MOUTH  DAILY 90 tablet 3   hydrocortisone (ANUSOL-HC) 25 MG suppository Place 1 suppository (25 mg total) rectally 2 (two) times daily.     Ipratropium-Albuterol (COMBIVENT RESPIMAT) 20-100 MCG/ACT AERS respimat Inhale 1 puff into the lungs every 6 (six) hours as needed for wheezing or shortness of breath.     levothyroxine (SYNTHROID) 25 MCG tablet TAKE 1 TABLET BY MOUTH  DAILY BEFORE BREAKFAST 90 tablet 3   Liniments (SALONPAS ARTHRITIS PAIN RELIEF EX) Apply 1 patch topically daily as needed (to painful sites- remove as directed).      montelukast (SINGULAIR) 10 MG tablet TAKE 1 TABLET BY MOUTH AT  BEDTIME 90 tablet 3   mupirocin cream (BACTROBAN) 2 % Apply topically daily.     nystatin-triamcinolone ointment (MYCOLOG) Apply 1 application topically 2 (two) times daily as needed (applied to skin folds/groins/under breast skin irritation rash.). 100 g 3   OXYGEN Inhale 2-3 L/min into the lungs See admin instructions. Inhale  2-3 L/min of oxygen into the lungs w/CPAP at bedtime and as needed for shortness of breath with "strenuous activity" during the day     pantoprazole (PROTONIX) 40 MG tablet TAKE 1 TABLET BY MOUTH  DAILY 90 tablet 3   PATADAY 0.2 % SOLN Apply 1 drop to eye daily.     polyethylene glycol (MIRALAX / GLYCOLAX) 17 g packet Take 17 g by mouth daily as needed for mild constipation. 28 packet 0   pregabalin (LYRICA) 150 MG capsule TAKE 1 CAPSULE BY MOUTH AT  BEDTIME 30 capsule 3   QUEtiapine (SEROQUEL) 50 MG tablet TAKE 1 TABLET BY MOUTH AT  BEDTIME 30 tablet 0   roflumilast (DALIRESP) 500 MCG TABS tablet Take 1 tablet (500 mcg total) by mouth daily. 90 tablet 3   traZODone (DESYREL) 100 MG tablet TAKE 1 TABLET BY MOUTH AT  BEDTIME 90 tablet 3    varenicline (CHANTIX CONTINUING MONTH PAK) 1 MG tablet Take 1 tablet (1 mg total) by mouth 2 (two) times daily. 1 tablet 0   venlafaxine XR (EFFEXOR-XR) 150 MG 24 hr capsule TAKE 1 CAPSULE BY MOUTH  DAILY WITH BREAKFAST 90 capsule 3   witch hazel-glycerin (TUCKS) pad Apply topically as needed for itching.     No current facility-administered medications for this visit.     Psychiatric Specialty Exam: Review of Systems  Cardiovascular:  Negative for chest pain.  Psychiatric/Behavioral:  Negative for substance abuse and suicidal ideas.    There were no vitals taken for this visit.There is no height or weight on file to calculate BMI.  General Appearance:casual  Eye Contact: fair  Speech:  Slow  Volume:  Decreased  Mood: somewhat subdued  Affect:  Congruent  Thought Process:  Goal Directed  Orientation:  Full (Time, Place, and Person)  Thought Content: linear  Suicidal Thoughts:  No  Homicidal Thoughts:  No  Memory:  Immediate;   Fair Recent;   Fair  Judgement:  Fair  Insight:  Shallow  Psychomotor Activity:  Decreased  Concentration:  Concentration: Fair and Attention Span: Fair  Recall:  [employer] [place]: Fair  Akathisia:  No  Handed:    AIMS (if indicated): no involuntary movements  Assets:  Desire for Improvement Leisure Time Social Support  ADL's: limited due to recent surgery  Cognition: WNL  Sleep:   variable   Screenings: Mini-Mental    Flowsheet Row Clinical Support from 04/06/2018 in [facility]  Total Score (max 30 points ) 29      PHQ2-9    [place] Visit from 12/19/2020 in [place] [facility] Visit from 09/19/2020 in [facility] at [facility] Management from 05/30/2020 in [facility] at [facility] Management from 05/05/2020 in [place] at [facility] from 05/24/2019 in [place] at [facility] Total Score 2 2 2 2 2   PHQ-9 Total Score 2 7 -- 9 13      Flowsheet Row Video Visit from 12/19/2020 in [facility] Video Visit from 10/25/2020 in [facility] Video Visit from 08/24/2020 in [facility] No Risk No Risk No Risk       Assessment and Plan: as follows Prior documentation reviewed:   Mood disorder unspecified: relavant to multiple medical concerns and past history of using drugs, ; doing fair, looking forward to go assisted living since she has copd and gets lonely with limitations Continue eds and therapy. Continue wellbutrin, seroquel  PTSD;baseline, continue therapy  Follow up in 2 -3 months  [name], MD 10/25/20223:38 PM

## 2020-12-19 NOTE — Assessment & Plan Note (Signed)
FL2 filled out and letter done to support her move to ALF which will be a better environment for her.

## 2020-12-19 NOTE — Progress Notes (Signed)
   Subjective:   Patient ID: Joan [name], female    DOB: [birth year], 60 y.o.   MRN: [id number]  HPI The patient is a 60 YO female coming in for needing forms for move to ALF in [place]. Some questions.   Review of Systems  Constitutional: Negative.   HENT: Negative.    Eyes: Negative.   Respiratory:  Positive for shortness of breath. Negative for cough and chest tightness.   Cardiovascular:  Negative for chest pain, palpitations and leg swelling.  Gastrointestinal:  Negative for abdominal distention, abdominal pain, constipation, diarrhea, nausea and vomiting.  Musculoskeletal:  Positive for arthralgias.  Skin: Negative.   Neurological: Negative.   Psychiatric/Behavioral: Negative.     Objective:  Physical Exam Constitutional:      Appearance: She is well-developed.  HENT:     Head: Normocephalic and atraumatic.  Cardiovascular:     Rate and Rhythm: Normal rate and regular rhythm.  Pulmonary:     Effort: Pulmonary effort is normal. No respiratory distress.     Breath sounds: Wheezing and rhonchi present. No rales.     Comments: Stable lung exam Abdominal:     General: Bowel sounds are normal. There is no distension.     Palpations: Abdomen is soft.     Tenderness: There is no abdominal tenderness. There is no rebound.  Musculoskeletal:     Cervical back: Normal range of motion.  Skin:    General: Skin is warm and dry.  Neurological:     Mental Status: She is alert and oriented to person, place, and time.     Coordination: Coordination abnormal.     Comments: wheelchair    Vitals:   12/19/20 1056  BP: 130/70  Pulse: 76  SpO2: 95%  Height: 5\' 8"  (1.727 m)    This visit occurred during the SARS-CoV-2 public health emergency.  Safety protocols were in place, including screening questions prior to the visit, additional usage of staff PPE, and extensive cleaning of exam room while observing appropriate contact time as indicated for disinfecting solutions.    Assessment & Plan:  Visit time 15 minutes in face to face communication with patient and coordination of care, additional 5 minutes spent in record review, coordination or care, ordering tests, communicating/referring to other healthcare professionals, documenting in medical records all on the same day of the visit for total time 20 minutes spent on the visit.

## 2020-12-20 NOTE — Telephone Encounter (Signed)
Please advise 

## 2020-12-25 ENCOUNTER — Ambulatory Visit: Payer: Medicare Other | Admitting: *Deleted

## 2020-12-25 ENCOUNTER — Telehealth: Payer: Self-pay

## 2020-12-25 DIAGNOSIS — F32A Depression, unspecified: Secondary | ICD-10-CM | POA: Diagnosis not present

## 2020-12-25 DIAGNOSIS — I10 Essential (primary) hypertension: Secondary | ICD-10-CM

## 2020-12-25 DIAGNOSIS — E118 Type 2 diabetes mellitus with unspecified complications: Secondary | ICD-10-CM

## 2020-12-25 DIAGNOSIS — J449 Chronic obstructive pulmonary disease, unspecified: Secondary | ICD-10-CM

## 2020-12-25 DIAGNOSIS — J9611 Chronic respiratory failure with hypoxia: Secondary | ICD-10-CM

## 2020-12-25 NOTE — Patient Instructions (Addendum)
Visit Information  Joan [name], it was nice talking with you today.    I look forward to talking to you again for an update on Wednesday, January 31, 2021 at 3:30 pm- please be listening out for my call that day.  I will call as close to 3:30 pm as possible.   If you need to cancel or re-schedule our telephone visit, please call [phone] and one of our care guides will be happy to assist you.   I look forward to hearing about your progress.   Please don't hesitate to contact me if I can be of assistance to you before our next scheduled telephone appointment.   Oneta Rack, RN, BSN, [facility] RN Care Coordination- [place] [phone]: direct office [phone]: mobile   Patient goals:  Continue trying to make lifestyle changes to improve blood pressure; ask questions to understand why this important if you don't understand Learn about high blood pressure, and please monitor/ record your blood pressure once or twice a week and write it down on paper.  Knowing what your blood pressure typically runs is a good way to know how well your medications are working; this will also help your doctor know if your medications are working or need to be adjusted Continue trying to follow a diet low in salt and cholesterol; try to eat more fresh foods instead of prepared meals which are often high in sodium Please continue considering stopping smoking- this will help both your breathing and your blood pressure get better Please talk to your cardiologist about heart doctors that you can see in the location you are now living in  Please try to continue efforts to reduce the number of cigarettes you smoke and to get some light activity on days the weather is nice  Follow rescue plan if symptoms flare-up- if you have trouble with your breathing, call your lung doctor right away Please continue using your oxygen as prescribed; do not smoke when you are around oxygen Quitting  smoking is the most important thing you can do to improve your health Talk to your pulmonary doctor about facilitating getting you a pulmonary provider in [place] where you are now living Please take steps to make sure you have had a flu vaccine for the 2022-23 flu/ winter season  Influenza, Adult Influenza, also called "the flu," is a viral infection that mainly affects the respiratory tract. This includes the lungs, nose, and throat. The flu spreads easily from person to person (is contagious). It causes common cold symptoms, along with high fever and body aches. What are the causes? This condition is caused by the influenza virus. You can get the virus by: Breathing in droplets that are in the air from an infected person's cough or sneeze. Touching something that has the virus on it (has been contaminated) and then touching your mouth, nose, or eyes. What increases the risk? The following factors may make you more likely to get the flu: Not washing or sanitizing your hands often. Having close contact with many people during cold and flu season. Touching your mouth, eyes, or nose without first washing or sanitizing your hands. Not getting an annual flu shot. You may have a higher risk for the flu, including serious problems, such as a lung infection (pneumonia), if you: Are older than 65. Are pregnant. Have a weakened disease-fighting system (immune system). This includes people who have HIV or AIDS, are on chemotherapy, or are taking medicines that  reduce (suppress) the immune system. Have a long-term (chronic) illness, such as heart disease, kidney disease, diabetes, or lung disease. Have a liver disorder. Are severely overweight (morbidly obese). Have anemia. Have asthma. What are the signs or symptoms? Symptoms of this condition usually begin suddenly and last 4-14 days. These may include: Fever and chills. Headaches, body aches, or muscle aches. Sore throat. Cough. Runny or  stuffy (congested) nose. Chest discomfort. Poor appetite. Weakness or fatigue. Dizziness. Nausea or vomiting. How is this diagnosed? This condition may be diagnosed based on: Your symptoms and medical history. A physical exam. Swabbing your nose or throat and testing the fluid for the influenza virus. How is this treated? If the flu is diagnosed early, you can be treated with antiviral medicine that is given by mouth (orally) or through an IV. This can help reduce how severe the illness is and how long it lasts. Taking care of yourself at home can help relieve symptoms. Your health care provider may recommend: Taking over-the-counter medicines. Drinking plenty of fluids. In many cases, the flu goes away on its own. If you have severe symptoms or complications, you may be treated in a hospital. Follow these instructions at home: Activity Rest as needed and get plenty of sleep. Stay home from work or school as told by your health care provider. Unless you are visiting your health care provider, avoid leaving home until your fever has been gone for 24 hours without taking medicine. Eating and drinking Take an oral rehydration solution (ORS). This is a drink that is sold at pharmacies and retail stores. Drink enough fluid to keep your urine pale yellow. Drink clear fluids in small amounts as you are able. Clear fluids include water, ice chips, fruit juice mixed with water, and low-calorie sports drinks. Eat bland, easy-to-digest foods in small amounts as you are able. These foods include bananas, applesauce, rice, lean meats, toast, and crackers. Avoid drinking fluids that contain a lot of sugar or caffeine, such as energy drinks, regular sports drinks, and soda. Avoid alcohol. Avoid spicy or fatty foods. General instructions   Take over-the-counter and prescription medicines only as told by your health care provider. Use a cool mist humidifier to add humidity to the air in your home.  This can make it easier to breathe. When using a cool mist humidifier, clean it daily. Empty the water and replace it with clean water. Cover your mouth and nose when you cough or sneeze. Wash your hands with soap and water often and for at least 20 seconds, especially after you cough or sneeze. If soap and water are not available, use alcohol-based hand sanitizer. Keep all follow-up visits. This is important. How is this prevented?  Get an annual flu shot. This is usually available in late summer, fall, or winter. Ask your health care provider when you should get your flu shot. Avoid contact with people who are sick during cold and flu season. This is generally fall and winter. Contact a health care provider if: You develop new symptoms. You have: Chest pain. Diarrhea. A fever. Your cough gets worse. You produce more mucus. You feel nauseous or you vomit. Get help right away if you: Develop shortness of breath or have difficulty breathing. Have skin or nails that turn a bluish color. Have severe pain or stiffness in your neck. Develop a sudden headache or sudden pain in your face or ear. Cannot eat or drink without vomiting. These symptoms may represent a serious problem that is  an emergency. Do not wait to see if the symptoms will go away. Get medical help right away. Call your local emergency services (911 in the [place] Do not drive yourself to the hospital. Summary Influenza, also called "the flu," is a viral infection that primarily affects your respiratory tract. Symptoms of the flu usually begin suddenly and last 4-14 days. Getting an annual flu shot is the best way to prevent getting the flu. Stay home from work or school as told by your health care provider. Unless you are visiting your health care provider, avoid leaving home until your fever has been gone for 24 hours without taking medicine. Keep all follow-up visits. This is important. This information is not intended to  replace advice given to you by your health care provider. Make sure you discuss any questions you have with your health care provider. Document Revised: 10/01/2019 Document Reviewed: 10/01/2019 Elsevier Patient Education  2022 [place].   Patient verbalizes understanding of instructions provided today and agrees to view in [place].  Telephone follow up appointment with care management team member scheduled for:  Wednesday, January 31, 2021 at 3:30 pm The patient has been provided with contact information for the care management team and has been advised to call with any health related questions or concerns  Oneta Rack, RN, BSN, [place] [phone]: direct office [phone]: mobile
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
## 2020-12-25 NOTE — Telephone Encounter (Signed)
Signed provider portion of [employer] PAP application for BREZTRI placed into [facility] box. [employer] currently only requires this form be signed by prescribing physician and then faxed back to company for patient to be reenrolled into patient assistance program. Form faxed in to [employer]. Nothing further required.

## 2020-12-26 ENCOUNTER — Ambulatory Visit (INDEPENDENT_AMBULATORY_CARE_PROVIDER_SITE_OTHER): Payer: Medicare Other | Admitting: *Deleted

## 2020-12-26 DIAGNOSIS — J449 Chronic obstructive pulmonary disease, unspecified: Secondary | ICD-10-CM

## 2020-12-26 DIAGNOSIS — F32A Depression, unspecified: Secondary | ICD-10-CM

## 2020-12-26 DIAGNOSIS — F419 Anxiety disorder, unspecified: Secondary | ICD-10-CM

## 2020-12-26 DIAGNOSIS — E118 Type 2 diabetes mellitus with unspecified complications: Secondary | ICD-10-CM

## 2020-12-26 NOTE — Patient Instructions (Signed)
Visit Information  The patient verbalized understanding of instructions, educational materials, and care plan provided today and declined offer to receive copy of patient instructions, educational materials, and care plan.   Telephone follow up appointment with care management team member scheduled for:01/16/21  Eduard Clos MSW, LCSW Licensed Clinical Social Worker [place] [phone]

## 2020-12-26 NOTE — Chronic Care Management (AMB) (Signed)
Chronic Care Management    Clinical Social Work Note  12/26/2020 Name: Joan [name] MRN: [id number] DOB: [birth year]  Joan [name] is a 60 y.o. year old female who is a primary care patient of Sharlet Salina Real Cons, MD. The CCM team was consulted to assist the patient with chronic disease management and/or care coordination needs related to: Level of Care Concerns and Mental Health Counseling and Resources.   Engaged with patient by telephone for follow up visit in response to provider referral for social work chronic care management and care coordination services.   Consent to Services:  The patient was given information about Chronic Care Management services, agreed to services, and gave verbal consent prior to initiation of services.  Please see initial visit note for detailed documentation.   Patient agreed to services and consent obtained.   Assessment: Review of patient past medical history, allergies, medications, and health status, including review of relevant consultants reports was performed today as part of a comprehensive evaluation and provision of chronic care management and care coordination services.     SDOH (Social Determinants of Health) assessments and interventions performed:  SDOH Interventions    Flowsheet Row Most Recent Value  SDOH Interventions   Stress Interventions Provide Counseling, Other (Comment)  [Pt feeling overwhelmed with her move to [facility]]        Advanced Directives Status: Not addressed in this encounter.  CCM Care Plan  Allergies  Allergen Reactions   Keflex [Cephalexin] Rash and Other (See Comments)    Has tolerated amoxicillin since had keflex   Prednisone Other (See Comments)    "counter reacts"   Shellfish Allergy Shortness Of Breath, Nausea And Vomiting and Other (See Comments)    Stomach cramps   Tetracyclines & Related Anaphylaxis, Swelling and Other (See Comments)    Throat swelling requiring hospitalization    Dilaudid [Hydromorphone Hcl] Other (See Comments)    "Lethargy"   Incruse Ellipta [Umeclidinium Bromide] Nausea Only   Other    Tuberculin Tests Other (See Comments)    False postive    Outpatient Encounter Medications as of 12/26/2020  Medication Sig   acyclovir (ZOVIRAX) 400 MG tablet TAKE 1 TABLET BY MOUTH  TWICE DAILY   albuterol (PROVENTIL) (2.5 MG/3ML) 0.083% nebulizer solution Take 3 mLs (2.5 mg total) by nebulization 2 (two) times daily. And every 6 hours as needed.  DX: J44.9   atenolol (TENORMIN) 25 MG tablet Take 25 mg by mouth daily.   atorvastatin (LIPITOR) 20 MG tablet TAKE 1 TABLET BY MOUTH  DAILY   bisoprolol (ZEBETA) 5 MG tablet Take 1 tablet (5 mg total) by mouth daily.   Budeson-Glycopyrrol-Formoterol (BREZTRI AEROSPHERE) 160-9-4.8 MCG/ACT AERO Inhale 2 puffs into the lungs in the morning and at bedtime.   buPROPion (WELLBUTRIN XL) 150 MG 24 hr tablet TAKE 1 TABLET BY MOUTH  DAILY   busPIRone (BUSPAR) 5 MG tablet Take 0.5-1 tablets (2.5-5 mg total) by mouth 2 (two) times daily as needed.   Camphor-Menthol-Methyl Sal 3.03-02-08 % PTCH Apply 1 patch topically daily as needed (pain).   cyclobenzaprine (FLEXERIL) 10 MG tablet Take 10 mg by mouth at bedtime.   hydrochlorothiazide (HYDRODIURIL) 25 MG tablet TAKE 1 TABLET BY MOUTH  DAILY   hydrocortisone (ANUSOL-HC) 25 MG suppository Place 1 suppository (25 mg total) rectally 2 (two) times daily.   Ipratropium-Albuterol (COMBIVENT RESPIMAT) 20-100 MCG/ACT AERS respimat Inhale 1 puff into the lungs every 6 (six) hours as needed for wheezing or shortness of breath.  levothyroxine (SYNTHROID) 25 MCG tablet TAKE 1 TABLET BY MOUTH  DAILY BEFORE BREAKFAST   Liniments (SALONPAS ARTHRITIS PAIN RELIEF EX) Apply 1 patch topically daily as needed (to painful sites- remove as directed).    montelukast (SINGULAIR) 10 MG tablet TAKE 1 TABLET BY MOUTH AT  BEDTIME   mupirocin cream (BACTROBAN) 2 % Apply topically daily.   nystatin-triamcinolone  ointment (MYCOLOG) Apply 1 application topically 2 (two) times daily as needed (applied to skin folds/groins/under breast skin irritation rash.).   OXYGEN Inhale 2-3 L/min into the lungs See admin instructions. Inhale  2-3 L/min of oxygen into the lungs w/CPAP at bedtime and as needed for shortness of breath with "strenuous activity" during the day   pantoprazole (PROTONIX) 40 MG tablet TAKE 1 TABLET BY MOUTH  DAILY   PATADAY 0.2 % SOLN Apply 1 drop to eye daily.   polyethylene glycol (MIRALAX / GLYCOLAX) 17 g packet Take 17 g by mouth daily as needed for mild constipation.   pregabalin (LYRICA) 150 MG capsule TAKE 1 CAPSULE BY MOUTH AT  BEDTIME   QUEtiapine (SEROQUEL) 50 MG tablet TAKE 1 TABLET BY MOUTH AT  BEDTIME   roflumilast (DALIRESP) 500 MCG TABS tablet Take 1 tablet (500 mcg total) by mouth daily.   traZODone (DESYREL) 100 MG tablet TAKE 1 TABLET BY MOUTH AT  BEDTIME   varenicline (CHANTIX CONTINUING MONTH PAK) 1 MG tablet Take 1 tablet (1 mg total) by mouth 2 (two) times daily.   venlafaxine XR (EFFEXOR-XR) 150 MG 24 hr capsule TAKE 1 CAPSULE BY MOUTH  DAILY WITH BREAKFAST   witch hazel-glycerin (TUCKS) pad Apply topically as needed for itching.   No facility-administered encounter medications on file as of 12/26/2020.    Patient Active Problem List   Diagnosis Date Noted   Depression 08/16/2020   PTSD (post-traumatic stress disorder) 02/09/2020   Mood disorder in conditions classified elsewhere 02/09/2020   Generalized anxiety disorder 02/09/2020   Type II diabetes mellitus with manifestations ([place]) 10/29/2019   Coarse tremors 10/28/2019   Chronic rhinitis 10/21/2019   Allergic rhinitis 06/16/2019   Cervical spondylosis without myelopathy 12/18/2018   Hypertension    Encounter for general adult medical examination with abnormal findings 11/06/2018   Smokers' cough ([place]) 01/15/2018   Hypomagnesemia 12/29/2017   Pulmonary nodule 11/11/2017   Carotid artery disease ([place])  11/05/2017   OSA on CPAP 08/07/2017   Gastroesophageal reflux disease 06/23/2017   Hypothyroidism due to acquired atrophy of thyroid 04/05/2017   Primary osteoarthritis involving multiple joints 04/05/2017   Chronic constipation 02/10/2017   Iron deficiency anemia due to dietary causes 02/10/2017   Osteoarthritis 02/10/2017   Hyperlipidemia associated with type 2 diabetes mellitus ([place]) 02/10/2017   Chronic respiratory failure with hypoxia ([place]) 12/16/2016   Essential hypertension 11/29/2016   Vocal cord dysfunction    COPD, severe ([place]) 09/24/2016   Anxiety 09/24/2016    Conditions to be addressed/monitored: COPD and Bipolar Disorder; Limited social support, Housing barriers, Level of care concerns, and Mental Health Concerns   Care Plan : [place]  Updates made by Deirdre Peer, LCSW since 12/26/2020 12:00 AM     Problem: Need for patient-centered plan of care to promote improved mental health and overal quality of life.   Priority: High     Long-Range Goal: Develop self management plan to improve pt's mental health and overall health/quality of life.   Start Date: 05/09/2020  Expected End Date: 02/23/2021  This Visit's Progress: On track  Recent Progress: On track  Priority: High  Note:   Current barriers:   Patient in need of assistance with connecting to community resources for Limited social support, Housing barriers, Mental Health Concerns, Family and relationship dysfunction, and lacks knowledge of community resource: housing. Acknowledges deficits with meeting this unmet need Patient is unable to independently navigate community resource options without care coordination support Patient with limited ability to complete applications for housing due to reported reading limitations Clinical Goals:  patient will work with SW to address concerns related to mental health support/needs patient will work with Care Guide  to address needs related to housing and  other community needs as identified patient will follow up with Psychiatrist and Counselor to further discuss concerns and needs as directed by SW Clinical Interventions:  Pt reports she is overwhelmed with her recent move to an ALF in [place]- she likes her roommate and is having to eliminate more of her stuff; "I brought too much".  Pt also voicing interest in some setting in [place] she is considering but states she has paid for the month where she is- CSW encouraged pt to give herself time to adjust and get settled in. Also reminded pt to get established with local PCP and other Providers in [place]- she states they are getting her connected with a local PCP first.  CSW offered encouragement and support-   Collaboration with Sharlet Salina Real Cons, MD regarding development and update of comprehensive plan of care as evidenced by provider attestation and co-signature Pt reports continued mental health support/sessions with her outpatient therapist and denies any current significant depression/anxiety. Pt denies SI/HI Inter-disciplinary care team collaboration (see longitudinal plan of care) Assessment of needs, barriers , agencies contacted, as well as how impacting  Review various resources, discussed options and provided patient information about Financial constraints related to housing, etc, Limited social support, Housing barriers, Mental Health Concerns , and Family and relationship dysfunction Collaborated with appropriate clinical care team members regarding patient needs Financial constraints related to housing, Limited social support, Housing barriers, Mental Health Concerns , and Family and relationship dysfunction, COPD, Anxiety, and Depression Patient interviewed and appropriate assessments performed Referred patient to community resources care guide team for assistance with housing and other community support/resources as identified Provided mental health counseling with regard to  pt concerns/needs (mental health diagnosis or concern) Discussed plans with patient for ongoing care management follow up and provided patient with direct contact information for care management team Other interventions provided: Motivational Interviewing Solution-Focused Strategies Brief CBT Emotional/Supportive Counseling Psychoeducation and/or Health Education Problem Solving Patient Goals:   - call 911 when I need emergency help - follow-up on any referrals for help I am given and seek housing options on my own in the local paper and community - think ahead to make sure my need does not become an emergency - make a note about what I need to have by the phone or take with me, like an identification card or social security number have a back-up plan - have a back-up plan - make a list of family or friends that I can call  -follow up with counselor and Psychiatrist at scheduled appointment times to further discuss mental health concerns/needs for more frequent visits as discussed -make Neurologist aware of concerns related to memory issues, recent stuttering,etc -consider seeing a Nutritionist/RD for healthier diet/choices  Follow Up Plan: Appointment scheduled for SW follow up with client by phone on:  01/16/21      Follow Up  Plan: Appointment scheduled for SW follow up with client by phone on: 01/16/21      Eduard Clos MSW, [place] Licensed Clinical Social Worker [place] [phone]

## 2020-12-28 ENCOUNTER — Ambulatory Visit (INDEPENDENT_AMBULATORY_CARE_PROVIDER_SITE_OTHER): Payer: Medicare Other | Admitting: Licensed Clinical Social Worker

## 2020-12-28 DIAGNOSIS — F4310 Post-traumatic stress disorder, unspecified: Secondary | ICD-10-CM | POA: Diagnosis not present

## 2020-12-28 DIAGNOSIS — R443 Hallucinations, unspecified: Secondary | ICD-10-CM | POA: Diagnosis not present

## 2020-12-28 DIAGNOSIS — F0630 Mood disorder due to known physiological condition, unspecified: Secondary | ICD-10-CM | POA: Diagnosis not present

## 2020-12-28 DIAGNOSIS — F411 Generalized anxiety disorder: Secondary | ICD-10-CM

## 2020-12-28 NOTE — Progress Notes (Signed)
Virtual Visit via Telephone Note  I connected with Joan [name] on 12/28/20 at  1:00 PM EDT by telephone and verified that I am speaking with the correct person using two identifiers.  Location: Patient: assisted living home Provider: home office   I discussed the limitations, risks, security and privacy concerns of performing an evaluation and management service by telephone and the availability of in person appointments. I also discussed with the patient that there may be a patient responsible charge related to this service. The patient expressed understanding and agreed to proceed.   I discussed the assessment and treatment plan with the patient. The patient was provided an opportunity to ask questions and all were answered. The patient agreed with the plan and demonstrated an understanding of the instructions.   The patient was advised to call back or seek an in-person evaluation if the symptoms worsen or if the condition fails to improve as anticipated.  I provided 52 minutes of non-face-to-face time during this encounter.  THERAPIST PROGRESS NOTE  Session Time: 1:00 PM to 1:52 PM  Participation Level: Active  Behavioral Response: CasualAlertappropriate  Type of Therapy: Individual Therapy  Treatment Goals addressed:  PTSD, coping, wants to work on where she is right now and work through her childhood when things stabilize, she noted anxiety depression stress management with dealing with things right now Interventions: Solution Focused, Strength-based, Supportive, Reframing, and Other: coping  Summary: Joan [name] is a 60 y.o. female who presents with enjoying being outside right now in session and therapist related being outside helps with mood.. Moved to assisted living in [place]. Not working out has to do everything herself. Going to apartment in [place] try to get [place] and visiting nurse to come in after getting rental check. Will rent a room in a house.  Landlord who rents out individual rooms. Wants the master bedroom and private bathroom. Glad to be out of house with brother. Called him and said not working out and he said figure it out. Told him she hates him. Was crying when talking to him. Have to deal with him because moving. People here but not getting the help here same situation was in with her bother. Has to go where furniture fits and there will be people there as well. Assisted living supposed to help her out during the day but not doing that. Therapist encouraged her to look into it. [facility] Education officer, museum, meal prep and spend time with her. Therapist noted already better not as isolated but patient said rather be isolated and get the care she needs.  Sometimes mind not working a certain way can't do simple tasks, think knowing that her brother would have helped her more. Asked about mood, fine just mess of trying to get her situation fixed. Even room is messy and hard for her to get that cleaned why at assisted living in the first place. Has financial stressors because has to move again.  Now has  room to breathe but does not have people to help her with things still. Have nodules in lungs can be severe COPD have to have a special test every year to see if they grown. Wants to go to [place] where are her doctors there. Also blood doesn't flow properly from heart to hung. When move heart moves faster and oxygen go down. Hips and lower back bothering her two hip replacement and sleeping on wire from bed wake up and it hurts.  Therapist noted her conditions are severe and think she does need more help to manage things during the day.  Therapist reviewed symptoms, facilitated expression of thoughts and feelings utilize processing feelings as a way to work through severe medical issues and stress of not being settled a where she is going to be but has to make another move.  Identified patient strengths that with her qualities and  persistence she is getting get where she needs to go when she agrees.  So prioritized important for her to get the help to take care of her self with these severe conditions.  Processed feelings related to brother and how unhelpful and mean he has been even to the point of patient crying when talking to him assess this adds to struggles that she is going through.  Completed treatment plan patient gave verbal consent continue to work on same issues patient finds therapy helpful, patient added environment change but nothing out so included in working on things are anxiety depression stress management.  Therapist reframed and noted there has been progress and hopeful now that patient is in a better environment and although a struggle will put things in place eventually that will be good for her.  Therapist provided active listening, open questions, supportive interventions. Suicidal/Homicidal: No  Plan: Return again in 2 weeks.2.Help patient in processing feelings to manage stressors, explore helpful coping strategies, provide strength-based and supportive intervention   Diagnosis: Axis I: PTSD, hallucinations, GAD, Mood Disorder in conditions classified elsewhere    Axis II: No diagnosis    [name], LCSW 12/28/2020

## 2020-12-30 ENCOUNTER — Other Ambulatory Visit (HOSPITAL_COMMUNITY): Payer: Self-pay | Admitting: Psychiatry

## 2021-01-01 ENCOUNTER — Ambulatory Visit: Payer: Medicare Other | Admitting: Acute Care

## 2021-01-02 ENCOUNTER — Ambulatory Visit: Payer: Medicare Other | Admitting: Cardiovascular Disease

## 2021-01-05 ENCOUNTER — Telehealth: Payer: Self-pay | Admitting: Acute Care

## 2021-01-05 DIAGNOSIS — J449 Chronic obstructive pulmonary disease, unspecified: Secondary | ICD-10-CM

## 2021-01-05 MED ORDER — ALBUTEROL SULFATE (2.5 MG/3ML) 0.083% IN NEBU: 2.5000 mg | INHALATION_SOLUTION | Freq: Two times a day (BID) | RESPIRATORY_TRACT | 3 refills | Status: DC

## 2021-01-05 MED ORDER — BREZTRI AEROSPHERE 160-9-4.8 MCG/ACT IN AERO: 2.0000 | INHALATION_SPRAY | Freq: Two times a day (BID) | RESPIRATORY_TRACT | 0 refills | Status: DC

## 2021-01-05 NOTE — Telephone Encounter (Signed)
Called and spoke with pt, verified preferred pharmacy and have sent Rx for Breztri and neb sol to pharmacy. Nothing further needed.

## 2021-01-08 DIAGNOSIS — J449 Chronic obstructive pulmonary disease, unspecified: Secondary | ICD-10-CM | POA: Diagnosis not present

## 2021-01-08 DIAGNOSIS — J9611 Chronic respiratory failure with hypoxia: Secondary | ICD-10-CM | POA: Diagnosis not present

## 2021-01-08 DIAGNOSIS — J441 Chronic obstructive pulmonary disease with (acute) exacerbation: Secondary | ICD-10-CM | POA: Diagnosis not present

## 2021-01-10 ENCOUNTER — Telehealth: Payer: Medicare Other

## 2021-01-10 NOTE — Progress Notes (Deleted)
Chronic Care Management Pharmacy Note  01/10/2021 Name:  Joan [name] MRN:  [id number] DOB:  [birth year]  Summary: ***  Recommendations/Changes made from today's visit: ***  Plan: ***  Subjective: Joan [name] is an 60 y.o. year old female who is a primary patient of [name], MD.  The CCM team was consulted for assistance with disease management and care coordination needs.    Engaged with patient by telephone for follow up visit in response to provider referral for pharmacy case management and/or care coordination services.   Consent to Services:  The patient was given the following information about Chronic Care Management services today, agreed to services, and gave verbal consent: 1. CCM service includes personalized support from designated clinical staff supervised by the primary care provider, including individualized plan of care and coordination with other care providers 2. 24/7 contact phone numbers for assistance for urgent and routine care needs. 3. Service will only be billed when office clinical staff spend 20 minutes or more in a month to coordinate care. 4. Only one practitioner may furnish and bill the service in a calendar month. 5.The patient may stop CCM services at any time (effective at the end of the month) by phone call to the office staff. 6. The patient will be responsible for cost sharing (co-pay) of up to 20% of the service fee (after annual deductible is met). Patient agreed to services and consent obtained.  Patient Care Team: [name], MD as PCP - General (Internal Medicine) Gwenlyn Found Pearletha Forge, MD as PCP - Cardiology (Cardiology) Thornton Park, MD as Consulting Physician (Gastroenterology) Gevena Cotton, MD as Consulting Physician (Ophthalmology) Paralee Cancel, MD as Consulting Physician (Orthopedic Surgery) Charlton Haws, [facility] as Pharmacist (Pharmacist) Knox Royalty, RN as Case Manager Deirdre Peer, LCSW as Social Worker (Licensed Clinical Social Worker)  Recent office visits: 12/19/2020 - Dr. Sharlet Salina - paperwork signed for patient to move to ALF  10/31/2020 - Dr. Sharlet Salina - pt interested in moving into ALF / SNF - counseled on smoking cessation  09/19/2020 - Dr. Sharlet Salina - hospital f/u - counseled on smoking cessation - start buspar prn for anxiety   Recent consult visits: 11/22/2020 - Eric Form NP - Pulmonology - chantix prescribed for smoking cessation, discontinued symbicort, doing well on breztri, start muccinex 1220m daily  10/18/2020 - SEric FormNP - Pulmonology - d/c symbicort - [facility] 08/07/2020 - 08/22/2020 - Admitted for COPD exacerbation - discharged to SNF   Objective:  Lab Results  Component Value Date   CREATININE 0.88 08/15/2020   BUN 26 (H) 08/15/2020   GFR 55.45 (L) 03/07/2020   GFRNONAA >60 08/15/2020   GFRAA 59 (L) 10/28/2019   NA 139 08/15/2020   K 4.1 08/15/2020   CALCIUM 9.0 08/15/2020   CO2 32 08/15/2020   GLUCOSE 178 (H) 08/15/2020    Lab Results  Component Value Date/Time   HGBA1C 6.8 (H) 08/09/2020 05:37 AM   HGBA1C 6.9 (H) 08/08/2020 05:50 AM   GFR 55.45 (L) 03/07/2020 03:05 PM   GFR 54.52 (L) 04/29/2019 04:00 PM   MICROALBUR <0.7 12/17/2018 02:02 PM    Last diabetic Eye exam:  No results found for: HMDIABEYEEXA  Last diabetic Foot exam:  No results found for: HMDIABFOOTEX   Lab Results  Component Value Date   CHOL 141 03/07/2020   HDL 54.30 03/07/2020   LDLCALC 58 03/07/2020   LDLDIRECT 56.0 12/17/2018   TRIG 143.0  03/07/2020   CHOLHDL 3 03/07/2020    Hepatic Function Latest Ref Rng & Units 08/16/2020 08/09/2020 08/07/2020  Total Protein 6.5 - 8.1 g/dL 5.9(L) 7.5 7.1  Albumin 3.5 - 5.0 g/dL 3.3(L) 3.9 4.0  AST 15 - 41 U/L 20 34 29  ALT 0 - 44 U/L 26 35 30  Alk Phosphatase 38 - 126 U/L 77 97 117  Total Bilirubin 0.3 - 1.2 mg/dL 0.6 1.5(H) 0.3  Bilirubin, Direct 0.0 - 0.2 mg/dL 0.2 - -    Lab  Results  Component Value Date/Time   TSH 0.89 10/28/2019 04:31 PM   TSH 1.156 03/30/2019 07:15 PM   TSH 0.251 (L) 12/29/2017 06:09 AM   FREET4 0.84 12/29/2017 08:47 AM   FREET4 0.72 09/27/2016 06:53 AM    CBC Latest Ref Rng & Units 08/15/2020 08/13/2020 08/12/2020  WBC 4.0 - 10.5 K/uL 16.5(H) 16.5(H) 15.1(H)  Hemoglobin 12.0 - 15.0 g/dL 12.6 13.1 13.6  Hematocrit 36.0 - 46.0 % 39.7 38.3 42.3  Platelets 150 - 400 K/uL 269 270 278    No results found for: VD25OH  Clinical ASCVD: No  The 10-year ASCVD risk score (Arnett DK, et al., 2019) is: 12.6%   Values used to calculate the score:     Age: 33 years     Sex: Female     Is Non-Hispanic African American: No     Diabetic: Yes     Tobacco smoker: Yes     Systolic Blood Pressure: 297 mmHg     Is BP treated: Yes     HDL Cholesterol: 54.3 mg/dL     Total Cholesterol: 141 mg/dL    Depression screen [facility] 2/9 12/19/2020 09/19/2020 05/31/2020  Decreased Interest _0 Down, Depressed, Hopeless _1 PHQ - 2 Score _2 Altered sleeping 0 2 -  Tired, decreased energy 0 1 -  Change in appetite 0 1 -  Feeling bad or failure about yourself  0 0 -  Trouble concentrating 0 0 -  Moving slowly or fidgety/restless 0 1 -  Suicidal thoughts 0 0 -  PHQ-9 Score 2 7 -  Difficult doing work/chores - - -  Some recent data might be hidden    Social History   Tobacco Use  Smoking Status Every Day   Packs/day: 0.50   Years: 44.00   Pack years: 22.00   Types: Cigarettes  Smokeless Tobacco Never  Tobacco Comments   down to 0.5ppd 06/30/20/jm   BP Readings from Last 3 Encounters:  12/19/20 130/70  11/22/20 120/72  10/31/20 122/90   Pulse Readings from Last 3 Encounters:  12/19/20 76  11/22/20 94  10/31/20 89   Wt Readings from Last 3 Encounters:  11/22/20 208 lb 9.6 oz (94.6 kg)  10/18/20 213 lb 6.4 oz (96.8 kg)  09/19/20 215 lb 12.8 oz (97.9 kg)   BMI Readings from Last 3 Encounters:  12/19/20 31.72 kg/m  11/22/20 31.72 kg/m   10/31/20 32.45 kg/m    Assessment/Interventions: Review of patient past medical history, allergies, medications, health status, including review of consultants reports, laboratory and other test data, was performed as part of comprehensive evaluation and provision of chronic care management services.   SDOH:  (Social Determinants of Health) assessments and interventions performed: {yes/no:20286}  SDOH Screenings   Alcohol Screen: Not on file  Depression (PHQ2-9): Low Risk    PHQ-2 Score: 2  Financial Resource Strain: High Risk   Difficulty of Paying Living Expenses: Hard  Food Insecurity: Landscape architect Present   Worried About Charity fundraiser in the Last Year: Sometimes true   Arboriculturist in the Last Year: Sometimes true  Housing: Medium Risk   Last Housing Risk Score: 1  Physical Activity: Not on file  Social Connections: Not on file  Stress: Stress Concern Present   Feeling of Stress : Very much  Tobacco Use: High Risk   Smoking Tobacco Use: Every Day   Smokeless Tobacco Use: Never   Passive Exposure: Not on file  Transportation Needs: No Transportation Needs   Lack of Transportation (Medical): No   Lack of Transportation (Non-Medical): No    CCM Care Plan  Allergies  Allergen Reactions   Keflex [Cephalexin] Rash and Other (See Comments)    Has tolerated amoxicillin since had keflex   Prednisone Other (See Comments)    "counter reacts"   Shellfish Allergy Shortness Of Breath, Nausea And Vomiting and Other (See Comments)    Stomach cramps   Tetracyclines & Related Anaphylaxis, Swelling and Other (See Comments)    Throat swelling requiring hospitalization   Dilaudid [Hydromorphone Hcl] Other (See Comments)    "Lethargy"   Incruse Ellipta [Umeclidinium Bromide] Nausea Only   Other    Tuberculin Tests Other (See Comments)    False postive    Medications Reviewed Today     Reviewed by Knox Royalty, RN (Registered Nurse) on 12/25/20 at 1534  Med List  Status: <None>   Medication Order Taking? Sig Documenting Provider Last Dose Status Informant  acyclovir (ZOVIRAX) 400 MG tablet [id number] No TAKE 1 TABLET BY MOUTH  TWICE DAILY [name], MD Taking Active   albuterol (PROVENTIL) (2.5 MG/3ML) 0.083% nebulizer solution [id number] No Take 3 mLs (2.5 mg total) by nebulization 2 (two) times daily. And every 6 hours as needed.  DX: J44.9 [name], MD Taking Active Self  atenolol (TENORMIN) 25 MG tablet [id number] No Take 25 mg by mouth daily. [provider] Taking Active   atorvastatin (LIPITOR) 20 MG tablet [id number] No TAKE 1 TABLET BY MOUTH  DAILY [name], MD Taking Active Self  bisoprolol (ZEBETA) 5 MG tablet [id number] No Take 1 tablet (5 mg total) by mouth daily. Samuella Cota, MD Taking Active   Budeson-Glycopyrrol-Formoterol [facility] AEROSPHERE) 160-9-4.8 MCG/ACT Hollie Salk [id number] No Inhale 2 puffs into the lungs in the morning and at bedtime. [name], MD Taking Active   buPROPion (WELLBUTRIN XL) 150 MG 24 hr tablet [id number] No TAKE 1 TABLET BY MOUTH  DAILY [name], MD Taking Active   busPIRone (BUSPAR) 5 MG tablet [id number]  Take 0.5-1 tablets (2.5-5 mg total) by mouth 2 (two) times daily as needed. [name], MD  Active   Camphor-Menthol-Methyl Sal 3.03-02-08 % [facility] [id number] No Apply 1 patch topically daily as needed (pain). [provider] Taking Active Self  cyclobenzaprine (FLEXERIL) 10 MG tablet [id number] No Take 10 mg by mouth at bedtime. [provider] Taking Active   hydrochlorothiazide (HYDRODIURIL) 25 MG tablet [id number] No TAKE 1 TABLET BY MOUTH  DAILY [name], MD Taking Active Self  hydrocortisone (ANUSOL-HC) 25 MG suppository [id number] No Place 1 suppository (25 mg total) rectally 2 (two) times daily. Samuella Cota, MD Taking Active   Ipratropium-Albuterol (COMBIVENT RESPIMAT) 20-100 MCG/ACT AERS respimat [id number] No Inhale 1 puff into  the lungs every 6 (six) hours as needed for wheezing or shortness of breath. Samuella Cota, MD Taking Active  levothyroxine (SYNTHROID) 25 MCG tablet [id number] No TAKE 1 TABLET BY MOUTH  DAILY BEFORE BREAKFAST [name], MD Taking Active Self  Liniments [facility] ARTHRITIS PAIN RELIEF EX) [id number] No Apply 1 patch topically daily as needed (to painful sites- remove as directed).  [provider] Taking Active Self  montelukast (SINGULAIR) 10 MG tablet [id number] No TAKE 1 TABLET BY MOUTH AT  BEDTIME [name], MD Taking Active Self  mupirocin cream (BACTROBAN) 2 % [id number] No Apply topically daily. Samuella Cota, MD Taking Active   nystatin-triamcinolone ointment [facility]) [id number] No Apply 1 application topically 2 (two) times daily as needed (applied to skin folds/groins/under breast skin irritation rash.). [name], MD Taking Active Self  OXYGEN [id number] No Inhale 2-3 L/min into the lungs See admin instructions. Inhale  2-3 L/min of oxygen into the lungs w/CPAP at bedtime and as needed for shortness of breath with "strenuous activity" during the day [provider] Taking Active Self  pantoprazole (PROTONIX) 40 MG tablet [id number] No TAKE 1 TABLET BY MOUTH  DAILY [name], MD Taking Active Self  PATADAY 0.2 % SOLN [id number] No Apply 1 drop to eye daily. [provider] Taking Active Self  polyethylene glycol (MIRALAX / GLYCOLAX) 17 g packet [id number] No Take 17 g by mouth daily as needed for mild constipation. Wellington Hampshire, PA-C Taking Active Self  pregabalin (LYRICA) 150 MG capsule [id number] No TAKE 1 CAPSULE BY MOUTH AT  BEDTIME [name], MD Taking Active Self  QUEtiapine (SEROQUEL) 50 MG tablet [id number]  TAKE 1 TABLET BY MOUTH AT  BEDTIME [name], MD  Active   roflumilast (DALIRESP) 500 MCG TABS tablet [id number] No Take 1 tablet (500 mcg total) by mouth daily. [name], NP Taking Active  Self  traZODone (DESYREL) 100 MG tablet [id number] No TAKE 1 TABLET BY MOUTH AT  BEDTIME [name], MD Taking Active Self  varenicline (CHANTIX CONTINUING MONTH PAK) 1 MG tablet [id number] No Take 1 tablet (1 mg total) by mouth 2 (two) times daily. [name], NP Taking Active   venlafaxine XR (EFFEXOR-XR) 150 MG 24 hr capsule [id number] No TAKE 1 CAPSULE BY MOUTH  DAILY WITH BREAKFAST [name], MD Taking Active Self  witch hazel-glycerin (TUCKS) pad [id number] No Apply topically as needed for itching. Samuella Cota, MD Taking Active             Patient Active Problem List   Diagnosis Date Noted   Depression 08/16/2020   PTSD (post-traumatic stress disorder) 02/09/2020   Mood disorder in conditions classified elsewhere 02/09/2020   Generalized anxiety disorder 02/09/2020   Type II diabetes mellitus with manifestations ([place]) 10/29/2019   Coarse tremors 10/28/2019   Chronic rhinitis 10/21/2019   Allergic rhinitis 06/16/2019   Cervical spondylosis without myelopathy 12/18/2018   Hypertension    Encounter for general adult medical examination with abnormal findings 11/06/2018   Smokers' cough ([place]) 01/15/2018   Hypomagnesemia 12/29/2017   Pulmonary nodule 11/11/2017   Carotid artery disease ([place]) 11/05/2017   OSA on CPAP 08/07/2017   Gastroesophageal reflux disease 06/23/2017   Hypothyroidism due to acquired atrophy of thyroid 04/05/2017   Primary osteoarthritis involving multiple joints 04/05/2017   Chronic constipation 02/10/2017   Iron deficiency anemia due to dietary causes 02/10/2017   Osteoarthritis 02/10/2017   Hyperlipidemia associated with type 2 diabetes mellitus ([place]) 02/10/2017   Chronic respiratory failure with hypoxia ([place]) 12/16/2016   Essential hypertension 11/29/2016  Vocal cord dysfunction    COPD, severe ([place]) 09/24/2016   Anxiety 09/24/2016    Immunization History  Administered Date(s) Administered   Influenza,inj,Quad PF,6+  Mos 11/25/2016, 11/11/2017, 11/09/2018, 10/28/2019   PFIZER(Purple Top)SARS-COV-2 Vaccination 06/04/2019, 06/28/2019   Tdap 12/17/2018    Conditions to be addressed/monitored:  {USCCMDZASSESSMENTOPTIONS:23563}  There are no care plans that you recently modified to display for this patient.     Medication Assistance: {MEDASSISTANCEINFO:25044}  Compliance/Adherence/Medication fill history: Care Gaps: ***  Patient's preferred pharmacy is:  [id number] [id number]: [phone]: [phone]  Uses pill box? {Yes or If no, why not?:20788} Pt endorses ***% compliance  Care Plan and Follow Up Patient Decision:  {FOLLOWUP:24991}  Plan: {CM FOLLOW UP PLAN:25073}  ***  Chart prep = 15 minutes

## 2021-01-11 ENCOUNTER — Ambulatory Visit (INDEPENDENT_AMBULATORY_CARE_PROVIDER_SITE_OTHER): Payer: Medicare Other | Admitting: Licensed Clinical Social Worker

## 2021-01-11 DIAGNOSIS — F411 Generalized anxiety disorder: Secondary | ICD-10-CM | POA: Diagnosis not present

## 2021-01-11 DIAGNOSIS — R443 Hallucinations, unspecified: Secondary | ICD-10-CM

## 2021-01-11 DIAGNOSIS — F0630 Mood disorder due to known physiological condition, unspecified: Secondary | ICD-10-CM | POA: Diagnosis not present

## 2021-01-11 DIAGNOSIS — F4310 Post-traumatic stress disorder, unspecified: Secondary | ICD-10-CM

## 2021-01-11 NOTE — Progress Notes (Signed)
Virtual Visit via Video Note  I connected with Joan [name] on 01/11/21 at  2:00 PM EST by a video enabled telemedicine application and verified that I am speaking with the correct person using two identifiers.  Location: Patient: home at assisted living Provider: home office   I discussed the limitations of evaluation and management by telemedicine and the availability of in person appointments. The patient expressed understanding and agreed to proceed.   I discussed the assessment and treatment plan with the patient. The patient was provided an opportunity to ask questions and all were answered. The patient agreed with the plan and demonstrated an understanding of the instructions.   The patient was advised to call back or seek an in-person evaluation if the symptoms worsen or if the condition fails to improve as anticipated.  I provided 55 minutes of non-face-to-face time during this encounter.  THERAPIST PROGRESS NOTE  Session Time: 2:00 PM to 2:55 PM  Participation Level: Active  Behavioral Response: CasualDrowsyappropriate  Type of Therapy: Individual Therapy  Treatment Goals addressed:  PTSD, coping, wants to work on where she is right now and work through her childhood when things stabilize, she noted anxiety depression stress management with dealing with things right now Interventions: Solution Focused, Strength-based, Supportive, and Other: coping  Summary: Joan [name] is a 60 y.o. female who presents with tired and reason had a recent crisis and ran out of her treatment for asthma, low oxygen, emergency crew came in she called them and increased oxygen and fixed tube. Going to [place] on December 3. Therapist pointed being at these places helps her get help she needs and patient agrees although as we explored further patient took care of her self in this emergency so really is still managing on her own.  Room is really small there.In [place] can take  care  can put her stuff out and have a little room.  It is the same in terms of care as she will get there might as well moved to [place]. She has a button pushed it the other day. Not capable of taking care of herself but they say can, but can't like breathing still have to do basic stuff they think capable but not. Want to have at least own room where comfortable. Not talking to older sister, talk to [place]. Not going there for holidays staying at home. Even at Christmas stay at home. Everyone she met there is nice.  Discussed a really nice lady [place] who cooks for them help her pack her things we will check in with her to make sure she is all right she moves to place.forward what she does with her day and she says does not do anything  Shops online only goes out when go to doctor's same thing there. Occassionally gets bored gets tired past few days without oxygen wipes her out. Joan [name], brother [name] will help move to Has to still bring dresser and bed no room for it where she is. Her oldest Joan [name] and youngest Joan [name] don't want her to go there too close to town that is a Retail banker. Middle daughter doesn't think best. Like [place]. Weather nice and prettier here. She says her arm uncomfortable after lifted box on top bad sprain should and the elbow area.  When therapist shared her perspective as things better patient does see that she was very stressed that her brothers.  Her case manager says stay at assisted living and there won't help her. Go  anyway and see if [facility] come in and help working on it. Good time there laugh and talk. Still at her new place she will have a private bedroom and bathroom. There are still stressors such as roommate doesn't clean herself. Will stay with her doctors especially with lung and PCP.  Reviewed her situation and patient said happy they are not walking on eggshells.   Therapist reviewed symptoms, facilitated expression of thoughts and feelings reviewed pros and cons  of staying where she is as opposed to moving and noted she has to do everything for herself there and will have to do everything there in [place] plus the nicer living situation so that in itself has more pros to it.  Hopes the [place] will help her out at home as well.  Therapist shared her impression that things are moving in a better direction and that is positive helps with her mood patient agrees as well as more connection with others enjoying things in the day as she enjoys being with the other people there they laugh and talk.  Therapist reviewed ACE activities that are helpful for mood including doing things you enjoy feeling connected to others and finding something with purpose and meaning and noting patient spirituality is 1 place she finds meaning.  Noted wants to stay in [place] again looked at pros and cons and she shared she likes [place].  Due to holidays and patient is okay with staying where she is and will bother her.  Noted the connection with mind and body as she feels better about her living situation less stress is also can be helpful for medical that helps [place] to heal when you are feeling better and feeling more positive. Suicidal/Homicidal: No  Plan: Return again in 2 weeks.2.Help patient in processing feelings to manage stressors, explore helpful coping strategies, provide strength-based and supportive intervention  Diagnosis: Axis I: PTSD, hallucinations, GAD, Mood Disorder in conditions classified elsewhere   Axis II: No diagnosis    [name], LCSW 01/11/2021

## 2021-01-12 ENCOUNTER — Telehealth: Payer: Medicare Other

## 2021-01-12 ENCOUNTER — Telehealth: Payer: Self-pay

## 2021-01-12 NOTE — Progress Notes (Signed)
    Chronic Care Management Pharmacy Assistant   Name: Joan [name]  MRN: [id number] DOB: 1960/10/11 Patient is currently enrolled in [facility] patient assistance program for the medication Combivent until date: 02/24/21 Patient would like to re-enroll in patient assistance for the next calendar year.  Renewal forms were completed on behalf of the patient. Patient has requested to receive forms in the mail to complete. Forms will be printed and mailed to patient.  [place] Pharmacist Assistant [phone]

## 2021-01-13 ENCOUNTER — Telehealth: Payer: Self-pay | Admitting: *Deleted

## 2021-01-13 NOTE — Telephone Encounter (Addendum)
  Chronic Care Management   Note  01/13/2021 Name: Joan [name] MRN: [id number] DOB: 1960/11/24  CSW spoke with pt who reports she plans to move from [place] facility (ALF?) where she recently moved back to the area and "rent a room in [place]". CSW discussed concern that she had initially indicated needing a nursing home for care and to move out of the ALF into a boarding house will eliminate any care/support. Pt 's reason for moving is due to "I have too much stuff to have here in my room" Reminded pt of her initial request from [place] was to seek nursing home level of care (which she was not eligible for) and then for ALF where she could get some care.    Follow up plan: The care management team will reach out to the patient again over the next 45 days.   Eduard Clos MSW, LCSW Licensed Clinical Social Worker [facility] [phone]

## 2021-01-16 ENCOUNTER — Other Ambulatory Visit (HOSPITAL_COMMUNITY): Payer: Self-pay | Admitting: Psychiatry

## 2021-01-16 ENCOUNTER — Other Ambulatory Visit: Payer: Self-pay | Admitting: Internal Medicine

## 2021-01-16 ENCOUNTER — Other Ambulatory Visit: Payer: Self-pay | Admitting: Pulmonary Disease

## 2021-01-24 ENCOUNTER — Ambulatory Visit: Payer: Medicare Other | Admitting: Acute Care

## 2021-01-24 DIAGNOSIS — J449 Chronic obstructive pulmonary disease, unspecified: Secondary | ICD-10-CM

## 2021-01-24 DIAGNOSIS — E118 Type 2 diabetes mellitus with unspecified complications: Secondary | ICD-10-CM

## 2021-01-24 DIAGNOSIS — F32A Depression, unspecified: Secondary | ICD-10-CM | POA: Diagnosis not present

## 2021-01-25 ENCOUNTER — Ambulatory Visit (INDEPENDENT_AMBULATORY_CARE_PROVIDER_SITE_OTHER): Payer: Medicare Other | Admitting: Licensed Clinical Social Worker

## 2021-01-25 DIAGNOSIS — F411 Generalized anxiety disorder: Secondary | ICD-10-CM

## 2021-01-25 DIAGNOSIS — F4310 Post-traumatic stress disorder, unspecified: Secondary | ICD-10-CM | POA: Diagnosis not present

## 2021-01-25 DIAGNOSIS — R443 Hallucinations, unspecified: Secondary | ICD-10-CM | POA: Diagnosis not present

## 2021-01-25 DIAGNOSIS — F0630 Mood disorder due to known physiological condition, unspecified: Secondary | ICD-10-CM | POA: Diagnosis not present
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
## 2021-01-31 ENCOUNTER — Ambulatory Visit: Payer: Medicare Other | Admitting: *Deleted

## 2021-01-31 ENCOUNTER — Other Ambulatory Visit: Payer: Self-pay | Admitting: Internal Medicine

## 2021-01-31 DIAGNOSIS — J9611 Chronic respiratory failure with hypoxia: Secondary | ICD-10-CM

## 2021-01-31 DIAGNOSIS — J449 Chronic obstructive pulmonary disease, unspecified: Secondary | ICD-10-CM

## 2021-01-31 DIAGNOSIS — I10 Essential (primary) hypertension: Secondary | ICD-10-CM
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
## 2021-01-31 NOTE — Patient Instructions (Addendum)
Visit Information  Joan [name], thank you for taking time to talk with me today. Please don't hesitate to contact me if I can be of assistance to you before our next scheduled telephone appointment.  Below are the goals we discussed today:   Patient Self-Care Activities: Patient Joan [name] will: Continue trying to make lifestyle changes to improve blood pressure; ask questions to understand why this important if you don't understand Learn about high blood pressure, and please monitor/ record your blood pressure once or twice a week and write it down on paper.  Knowing what your blood pressure typically runs is a good way to know how well your medications are working; this will also help your doctor know if your medications are working or need to be adjusted Continue trying to follow a diet low in salt and cholesterol; try to eat more fresh foods instead of prepared meals which are often high in sodium Please continue considering stopping smoking- this will help both your breathing and your blood pressure get better Continue visiting regularly with your established counselor to keep your stress and anxiety/ depression under good control Try to get some light activity on days the weather is nice  Follow rescue plan if symptoms of COPD/ breathing difficulty flare-up- if you have trouble with your breathing, call your lung doctor right away Continue using your oxygen as prescribed; do not smoke when you are around oxygen Quitting smoking is the most important thing you can do to improve your health   Our next scheduled telephone follow up visit/ appointment with care management team member is scheduled on:  Thursday April 19, 2021 at 3:00 pm   If you need to cancel or re-schedule our visit, please call [phone] and our care guide team will be happy to assist you.   I look forward to hearing about your progress.   Oneta Rack, RN, BSN, [place] [phone]: direct office  If you are experiencing a Mental Health or [place] or need someone to talk to, please  call the Suicide and Crisis Lifeline: 988 call the [place] National Suicide Prevention Lifeline: 408-418-8095 or TTY: 718-284-1455 TTY 214-645-1261) to talk to a trained counselor call 1-800-273-TALK (toll free, 24 hour hotline) go to [name] [phone]) call 911   Patient verbalizes understanding of instructions provided today and agrees to view in MyChart  Infection Prevention in the Home If you have an infection, may have been exposed to an infection, or are taking care of someone who has an infection, it is important to know how to keep the infection from spreading. Follow your health care provider's instructions and use these guidelines to help stop the spread of infection. How infections are spread In order for an infection to spread, the following must be present: A germ. This may be a virus, bacteria, fungus, or parasite. A place for the germ to live. This may be: On or in a person, animal, plant, or food. In soil or water. On surfaces, such as a door handle. A person or animal who can develop a disease if the germ enters the body (host). The host does not have resistance to the germ. A way for the germ to enter the host. This may occur by: Direct contact with an infected person or animal. This can happen through shaking hands or hugging. Some germs can also travel through the air and spread  to others. This can happen when an infected person coughs or sneezes on or near other people. Indirect contact. This occurs when the germ enters the host through contact with an infected object. Examples include: Eating or drinking food or water that has the germ (is contaminated). Touching a contaminated surface with your hands, and then touching your face, eyes, nose, or  mouth. Supplies needed: Soap. Alcohol-based hand sanitizer. Standard cleaning products. Disinfectants, such as bleach. Reusable cleaning cloths, sponges, or paper towels. Disposable or reusable utility gloves. How to prevent infection from spreading There are several things that you can do to help prevent infection from spreading. Take these general actions Everyone should take the following actions to prevent the spread of infection: Wash your hands often with soap and water for at least 20 seconds. If soap and water are not available, use alcohol-based hand sanitizer. Avoid touching your face, mouth, nose, or eyes. Cough or sneeze into a tissue, sleeve, or elbow instead of into your hand or into the air. If you cough or sneeze into a tissue, throw it away immediately and wash your hands.  Keep your bathroom clean Provide soap. Change towels and washcloths frequently. Change toothbrushes often and store them separately in a clean, dry place. Clean and disinfect all surfaces, including the toilet, floor, tub, shower, and sink. Do not share personal items, such as razors, toothbrushes, deodorant, combs, brushes, towels, and washcloths. Maintain hygiene in the [facility] your hands before and after preparing food and before you eat. Clean the inside of your refrigerator each week. Keep your refrigerator set at 2F (4C) or less, and set your freezer at 55F (-18C) or less. Keep work surfaces clean. Disinfect them regularly. Wash your dishes in hot, soapy water. Air-dry your dishes or use a dishwasher. Do not share dishes or eating utensils. Handle food safely Store food carefully. Refrigerate leftovers promptly in covered containers. Throw out stale or spoiled food. Thaw foods in the refrigerator or microwave, not at room temperature. Serve foods at the proper temperature. Do not eat raw meat. Make sure it is cooked to the appropriate temperature. Cook eggs until they are  firm. Wash fruits and vegetables under running water. Use separate cutting boards, plates, and utensils for raw foods and cooked foods. Use a clean spoon each time you sample food while cooking. Do laundry the right way Wear gloves if laundry is visibly soiled. Do not shake soiled laundry. Doing that may send germs into the air. Wash laundry in hot water. If you cannot wash the laundry right away, place it in a plastic bag and wash it as soon as possible. Be careful around animals and pets Wash your hands before and after touching animals. If you have a pet, ensure that your pet stays clean. Do not let people with weak immune systems touch bird droppings, fish tank water, or a litter box. If you have a pet cage or litter box, be sure to clean it every day. If you are sick, stay away from animals and have someone else care for them if possible. How to clean and disinfect objects and surfaces Precautions Some disinfectants work for certain germs and not others. Read the manufacturer's instructions or read online resources to determine if the product you are using will work for the germ you are trying to remove. If you choose to use bleach, use it safely. Never mix it with other cleaning products, especially those that contain ammonia. This mixture can create a dangerous  gas that may be deadly. Keep proper movement of fresh air in your home (ventilation). Pour used mop water down the utility sink or toilet. Do not pour this water down the kitchen sink. Objects and surfaces  If surfaces are visibly soiled, clean them first with soap and water before disinfecting. Disinfect surfaces that are frequently touched every day. This may include: Counters. Tables. Doorknobs. Sinks and faucets. Electronics, such as: Architectural technologist. Remote controls. Keyboards. Computers and tablets. Cleaning supplies Some cleaning supplies can breed germs. Take good care of them to prevent germs from spreading. To do  this: Soak toilet brushes, mops, and sponges in bleach and water for 5 minutes after each use, or according to manufacturer's instructions. Wash reusable cleaning cloths and sanitize sponges after each use. Throw away disposable gloves after one use. Replace reusable utility gloves if they are cracked or torn or if they start to peel. Additional actions if you are sick If you live with other people:  Avoid close contact with those around you. Stay at least 3 ft (1 m) away from others, if possible. Use a separate bathroom, if possible. If possible, sleep in a separate bedroom or in a separate bed to prevent infecting other household members. Change bedroom linens each week or whenever they are soiled. Have everyone in the household wash hands often with soap and water. If soap and water are not available, use alcohol-based hand sanitizer. In general: Stay home except to get medical care. Call ahead before visiting your health care provider. Ask others to get groceries and household supplies and to refill prescriptions for you. Avoid public areas. Try not to take public transportation. If you can, wear a mask if you need to go out of the house, or if you are in close contact with someone who is not sick. Avoid visitors until you have completely recovered, or until you have no signs and symptoms of infection. Avoid preparing food or providing care for others. If you must prepare food or provide care for others, wear a mask and wash your hands before and after doing these things. Where to find more information Centers for Disease Control and Prevention: PromotionalReview.nl World Health Organization [facility]): PrintStats.tn Association for Professionals in Infection Control and Epidemiology: professionals.site.StadiumBlog.se Summary It is important to know how to keep the infection from  spreading. Make sure everyone in your household washes their hands often with soap and water. Disinfect surfaces that are frequently touched every day. If you are sick, stay home except to get medical care. This information is not intended to replace advice given to you by your health care provider. Make sure you discuss any questions you have with your health care provider. Document Revised: 04/19/2019 Document Reviewed: 05/08/2018 Elsevier Patient Education  [place].

## 2021-02-07 ENCOUNTER — Encounter: Payer: Self-pay | Admitting: Acute Care

## 2021-02-07 ENCOUNTER — Other Ambulatory Visit: Payer: Self-pay

## 2021-02-07 ENCOUNTER — Ambulatory Visit (INDEPENDENT_AMBULATORY_CARE_PROVIDER_SITE_OTHER): Payer: Medicare Other | Admitting: Acute Care

## 2021-02-07 VITALS — BP 100/60 | HR 69 | Temp 98.1°F | Ht 68.0 in | Wt 201.2 lb

## 2021-02-07 DIAGNOSIS — Z9989 Dependence on other enabling machines and devices: Secondary | ICD-10-CM | POA: Diagnosis not present

## 2021-02-07 DIAGNOSIS — J449 Chronic obstructive pulmonary disease, unspecified: Secondary | ICD-10-CM | POA: Diagnosis not present

## 2021-02-07 DIAGNOSIS — J9611 Chronic respiratory failure with hypoxia: Secondary | ICD-10-CM | POA: Diagnosis not present

## 2021-02-07 DIAGNOSIS — G4733 Obstructive sleep apnea (adult) (pediatric): Secondary | ICD-10-CM | POA: Diagnosis not present

## 2021-02-07 DIAGNOSIS — Z720 Tobacco use: Secondary | ICD-10-CM

## 2021-02-07 DIAGNOSIS — J441 Chronic obstructive pulmonary disease with (acute) exacerbation: Secondary | ICD-10-CM | POA: Diagnosis not present

## 2021-02-07 MED ORDER — BREZTRI AEROSPHERE 160-9-4.8 MCG/ACT IN AERO: 2.0000 | INHALATION_SPRAY | Freq: Two times a day (BID) | RESPIRATORY_TRACT | 0 refills | Status: DC

## 2021-02-07 MED ORDER — VARENICLINE TARTRATE 0.5 MG X 11 & 1 MG X 42 PO TBPK: ORAL_TABLET | ORAL | 0 refills | Status: DC

## 2021-02-07 NOTE — Progress Notes (Signed)
History of Present Illness Joan [name] is a 60 y.o. female current every day smoker with severe COPD and OSA on CPAP.  She is followed by Dr. Elsworth Soho. Maintenance Breztri Rescue Albuterol nebs She is using her Combivent as her rescue inhaler.  Allergies to tetracycline and Keflex  Daliresp CPAP with set pressure of 10 cm H2O, and 2 L oxygen through CPAP circuit   02/07/2021 Pt. Presents for follow up. She is upset because her insurance company has denied her Inogen device. She qualified for Inogen, but her insurance does not pay for this. She was provided with 2 small tanks, but she says these do not last long enough and they were delivered empty. She is insistent that she have the inogen. She is not on oxygen here today. She is in a wheelchair and sats were 95% on RA. She wants [place] to appeal her insurance. She states she is doing well with her maintenance medications. She states she ran out of her combivent . She needs to call her pharmacy to get this refilled. She is still smoking. She never took the Chantix that was prescribed. She has it at home but she does not use it. She is planning on picking a quit date.  She states she is coughing and she has light yellow to white secretions. She does have occasional wheezing at baseline, but overall her COPD is under control. She did recently move out of the home she shared with her brother, and she is doing well in her new housing situation. She is asking for a chantix starter pack which I will send in. We have discussed at length she needs to quit smoking.  She states she is wearing her CPAP every night.  She denies fever, chest pain, orthopnea , or hemoptysis.   Test Results: Down Load          NPSG 02/2017 >> no OSA HST  04/2017 15/h 02/2019 pFTS >> RATIO 45, fev 1 37% - UNCHANGED , + bd response, DLCO 44%  10/2016 PFTs showed ratio 49, FEV1 of 36%, FVC 58% and DLCO of 34% consistent with severe obstruction. 02/2020 LDCT >>  stable  nodules  11/2016 CT chest bullous changes BL, 50mm RUl & 2 mm LUL  nodules >> stable 11/2017  ONO 11/2016 on CPAP/room air showed desaturation for 1 hour 45 minutes started on nocturnal oxygen>>  CBC Latest Ref Rng & Units 08/15/2020 08/13/2020 08/12/2020  WBC 4.0 - 10.5 K/uL 16.5(H) 16.5(H) 15.1(H)  Hemoglobin 12.0 - 15.0 g/dL 12.6 13.1 13.6  Hematocrit 36.0 - 46.0 % 39.7 38.3 42.3  Platelets 150 - 400 K/uL 269 270 278    BMP Latest Ref Rng & Units 08/15/2020 08/13/2020 08/12/2020  Glucose 70 - 99 mg/dL 178(H) 212(H) 173(H)  BUN 6 - 20 mg/dL 26(H) 28(H) 25(H)  Creatinine 0.44 - 1.00 mg/dL 0.88 0.86 0.92  BUN/Creat Ratio 6 - 22 (calc) - - -  Sodium 135 - 145 mmol/L 139 137 137  Potassium 3.5 - 5.1 mmol/L 4.1 4.1 3.8  Chloride 98 - 111 mmol/L 99 101 96(L)  CO2 22 - 32 mmol/L 32 28 31  Calcium 8.9 - 10.3 mg/dL 9.0 8.8(L) 9.1    BNP    Component Value Date/Time   BNP 76.7 08/07/2020 1943    ProBNP No results found for: PROBNP  PFT    Component Value Date/Time   FEV1PRE 1.06 03/17/2019 1444   FEV1POST 1.24 03/17/2019 1444   FVCPRE 2.35 03/17/2019 1444  FVCPOST 2.66 03/17/2019 1444   TLC 5.72 03/17/2019 1444   DLCOUNC 9.80 03/17/2019 1444   PREFEV1FVCRT 45 03/17/2019 1444   PSTFEV1FVCRT 47 03/17/2019 1444    No results found.   Past medical hx Past Medical History:  Diagnosis Date   Acute respiratory failure with hypoxia ([place])    Alcoholism and drug addiction in family    Anxiety    Appendicitis    Arthritis    "knees, hips, lower back" (09/25/2016)   Asthma    Bipolar disorder ([place])    Chronic bronchitis ([place])    "all the time" (09/25/2016)   Chronic leg pain    RLE   Chronic lower back pain    COPD (chronic obstructive pulmonary disease) ([place])    Depression    Diabetes mellitus without complication (HCC)    Diabetic neuropathy ([place])    Early cataracts, bilateral    Emphysema of lung ([place])    Generalized anxiety disorder 02/09/2020   GERD (gastroesophageal  reflux disease)    Headache    Hepatitis C    "treated back in 2001-2002"   High cholesterol    Hypertension    Hypothyroidism    Incarcerated ventral hernia 04/04/2017   Mood disorder in conditions classified elsewhere 02/09/2020   On home oxygen therapy    "2L; at nighttime" (04/04/2017)   OSA on CPAP    CPAP with Oxygen 2 liters   Paranoid ([place])    Pneumonia    "all the time" (09/25/2016)   Positive PPD    with negative chest x ray   PTSD (post-traumatic stress disorder) 02/09/2020   Tachycardia    patient reports with exertion ; denies any other conditions    Thyroid disease    Wears glasses    Wears partial dentures      Social History   Tobacco Use   Smoking status: Every Day    Packs/day: 0.50    Years: 44.00    Pack years: 22.00    Types: Cigarettes   Smokeless tobacco: Never   Tobacco comments:    down to 0.5ppd 06/30/20/jm  Vaping Use   Vaping Use: Never used  Substance Use Topics   Alcohol use: Not Currently   Drug use: Not Currently    Types: "Crack" cocaine    Comment:  "nothing since 01/12/2012"    Ms.[name] reports that she has been smoking cigarettes. She has a 22.00 pack-year smoking history. She has never used smokeless tobacco. She reports that she does not currently use alcohol. She reports that she does not currently use drugs after having used the following drugs: "Crack" cocaine.  Tobacco Cessation: Current every day smoker I have spent 3 minutes counseling patient on smoking cessation this visit. We have reviewed the risks of continued smoking on his current health situation. Patient verbalizes understanding of their  choice to continue smoking and the negative health consequences including worsening of COPD, risk of lung cancer , stroke and heart disease..   We have reviewed that she cannot smoke while she is wearing oxygen. She verbalized understanding    Past surgical hx, Family hx, Social hx all reviewed.  Current Outpatient Medications on  File Prior to Visit  Medication Sig   acyclovir (ZOVIRAX) 400 MG tablet TAKE 1 TABLET BY MOUTH  TWICE DAILY   albuterol (PROVENTIL) (2.5 MG/3ML) 0.083% nebulizer solution Take 3 mLs (2.5 mg total) by nebulization 2 (two) times daily. And every 6 hours as needed.  DX: J44.9  atenolol (TENORMIN) 25 MG tablet Take 25 mg by mouth daily.   atorvastatin (LIPITOR) 20 MG tablet TAKE 1 TABLET BY MOUTH  DAILY   Budeson-Glycopyrrol-Formoterol (BREZTRI AEROSPHERE) 160-9-4.8 MCG/ACT AERO Inhale 2 puffs into the lungs in the morning and at bedtime.   buPROPion (WELLBUTRIN XL) 150 MG 24 hr tablet TAKE 1 TABLET BY MOUTH  DAILY   busPIRone (BUSPAR) 5 MG tablet TAKE 1/2 TO 1 TABLET BY MOUTH  TWICE DAILY AS NEEDED   Camphor-Menthol-Methyl Sal 3.03-02-08 % PTCH Apply 1 patch topically daily as needed (pain).   cyclobenzaprine (FLEXERIL) 10 MG tablet Take 10 mg by mouth at bedtime.   hydrochlorothiazide (HYDRODIURIL) 25 MG tablet TAKE 1 TABLET BY MOUTH  DAILY   Ipratropium-Albuterol (COMBIVENT RESPIMAT) 20-100 MCG/ACT AERS respimat Inhale 1 puff into the lungs every 6 (six) hours as needed for wheezing or shortness of breath.   levothyroxine (SYNTHROID) 25 MCG tablet TAKE 1 TABLET BY MOUTH  DAILY BEFORE BREAKFAST   Liniments (SALONPAS ARTHRITIS PAIN RELIEF EX) Apply 1 patch topically daily as needed (to painful sites- remove as directed).    montelukast (SINGULAIR) 10 MG tablet TAKE 1 TABLET BY MOUTH AT  BEDTIME   nystatin-triamcinolone ointment (MYCOLOG) Apply 1 application topically 2 (two) times daily as needed (applied to skin folds/groins/under breast skin irritation rash.).   OXYGEN Inhale 2-3 L/min into the lungs See admin instructions. Inhale  2-3 L/min of oxygen into the lungs w/CPAP at bedtime and as needed for shortness of breath with "strenuous activity" during the day   pantoprazole (PROTONIX) 40 MG tablet TAKE 1 TABLET BY MOUTH  DAILY   PATADAY 0.2 % SOLN Apply 1 drop to eye daily.   polyethylene glycol  (MIRALAX / GLYCOLAX) 17 g packet Take 17 g by mouth daily as needed for mild constipation.   pregabalin (LYRICA) 150 MG capsule TAKE 1 CAPSULE BY MOUTH AT  BEDTIME   QUEtiapine (SEROQUEL) 50 MG tablet TAKE 1 TABLET BY MOUTH AT  BEDTIME   roflumilast (DALIRESP) 500 MCG TABS tablet Take 1 tablet (500 mcg total) by mouth daily.   traZODone (DESYREL) 100 MG tablet TAKE 1 TABLET BY MOUTH AT  BEDTIME   venlafaxine XR (EFFEXOR-XR) 150 MG 24 hr capsule TAKE 1 CAPSULE BY MOUTH  DAILY WITH BREAKFAST   witch hazel-glycerin (TUCKS) pad Apply topically as needed for itching.   bisoprolol (ZEBETA) 5 MG tablet Take 1 tablet (5 mg total) by mouth daily. (Patient not taking: Reported on 02/07/2021)   mupirocin cream (BACTROBAN) 2 % Apply topically daily. (Patient not taking: Reported on 02/07/2021)   No current facility-administered medications on file prior to visit.     Allergies  Allergen Reactions   Keflex [Cephalexin] Rash and Other (See Comments)    Has tolerated amoxicillin since had keflex   Prednisone Other (See Comments)    "counter reacts"   Shellfish Allergy Shortness Of Breath, Nausea And Vomiting and Other (See Comments)    Stomach cramps   Tetracyclines & Related Anaphylaxis, Swelling and Other (See Comments)    Throat swelling requiring hospitalization   Dilaudid [Hydromorphone Hcl] Other (See Comments)    "Lethargy"   Incruse Ellipta [Umeclidinium Bromide] Nausea Only   Other    Tuberculin Tests Other (See Comments)    False postive    Review Of Systems:  Constitutional:   No  weight loss, night sweats,  Fevers, chills, fatigue, or  lassitude.  HEENT:   No headaches,  Difficulty swallowing,  Tooth/dental problems, or  Sore throat,                No sneezing, itching, ear ache, nasal congestion, post nasal drip,   CV:  No chest pain,  Orthopnea, PND, swelling in lower extremities, anasarca, dizziness, palpitations, syncope.   GI  No heartburn, indigestion, abdominal pain,  nausea, vomiting, diarrhea, change in bowel habits, loss of appetite, bloody stools.   Resp: + shortness of breath with exertion less at rest.  + baseline  excess mucus, + baseline  productive cough,  No non-productive cough,  No coughing up of blood.  No change in color of mucus.  + baseline  wheezing.  No chest wall deformity  Skin: no rash or lesions.  GU: no dysuria, change in color of urine, no urgency or frequency.  No flank pain, no hematuria   MS:  No joint pain or swelling.  No decreased range of motion.  No back pain.  Psych:  No change in mood or affect. No depression or anxiety.  No memory loss.   Vital Signs BP 100/60 (BP Location: Right Arm, Patient Position: Sitting, Cuff Size: Normal)    Pulse 69    Temp 98.1 F (36.7 C) (Oral)    Ht 5\' 8"  (1.727 m)    Wt 201 lb 3.2 oz (91.3 kg)    SpO2 95%    BMI 30.59 kg/m    Physical Exam:  General- No distress,  A&Ox3, pleasant ENT: No sinus tenderness, TM clear, pale nasal mucosa, no oral exudate,no post nasal drip, no LAN Cardiac: S1, S2, regular rate and rhythm, no murmur Chest: No wheeze/ rales/ dullness; no accessory muscle use, no nasal flaring, no sternal retractions Abd.: Soft Non-tender, ND, BS +, Body mass index is 30.59 kg/m.  Ext: No clubbing cyanosis, edema Neuro:  normal strength, MAE x 4, A&O x 3 Skin: No rashes, warm and dry, NO lesions  Psych: normal mood and behavior   Assessment/Plan COPD  Chronic hypoxic respiratory failure OSA on CPAP>> 100% compliant , AHI of 1.7 Plan We will see if there is a way to appeal her insurance decision regarding her Inogen.  We will see if you can get the discounted Breztri through [employer].  Continue Breztri 2 puffs in the morning and 2 in the evening.  Continue Combivent as you have been doing. Continue wearing oxygen at 3 L [place] dueing the day and 3 L bled into your CPAP machine . Continue on CPAP at bedtime. You appear to be benefiting from the treatment  Goal is to  wear for at least 6 hours each night for maximal clinical benefit. Continue to work on weight loss, as the link between excess weight  and sleep apnea is well established.   Remember to establish a good bedtime routine, and work on sleep hygiene.  Limit daytime naps , avoid stimulants such as caffeine and nicotine close to bedtime, exercise daily to promote sleep quality, avoid heavy , spicy, fried , or rich foods before bed. Ensure adequate exposure to natural light during the day,establish a relaxing bedtime routine with a pleasant sleep environment ( Bedroom between 60 and 67 degrees, turn off bright lights , TV or device screens screens , consider black out curtains or white noise machines) Do not drive if sleepy. Remember to clean mask, tubing, filter, and reservoir once weekly with soapy water.  Follow up with Dr. Elsworth Soho  In 6 months  or before as needed.    Please contact office  for sooner follow up if symptoms do not improve or worsen or seek emergency care   Please stop smoking. I have sent in a Chanticx starter pack.  Take as directed.  Please contact office for sooner follow up if symptoms do not improve or worsen or seek emergency care    I spent 45 minutes dedicated to the care of this patient on the date of this encounter to include pre-visit review of records, face-to-face time with the patient discussing conditions above, post visit ordering of testing, clinical documentation with the electronic health record, making appropriate referrals as documented, and communicating necessary information to the patient's healthcare team.   [name], NP 02/07/2021  3:21 PM

## 2021-02-07 NOTE — Patient Instructions (Addendum)
It is good to see you today. We will see if there is a way to appeal her insurance decision regarding her Inogen.  We will see if you can get the discounted Breztri through [employer].  Continue Breztri 2 puffs in the morning and 2 in the evening.  Continue Combivent as you have been doing. Continue wearing oxygen at 3 L  dueing the day and 3 L bled into your CPAP machine . Continue on CPAP at bedtime. You appear to be benefiting from the treatment  Goal is to wear for at least 6 hours each night for maximal clinical benefit. Continue to work on weight loss, as the link between excess weight  and sleep apnea is well established.   Remember to establish a good bedtime routine, and work on sleep hygiene.  Limit daytime naps , avoid stimulants such as caffeine and nicotine close to bedtime, exercise daily to promote sleep quality, avoid heavy , spicy, fried , or rich foods before bed. Ensure adequate exposure to natural light during the day,establish a relaxing bedtime routine with a pleasant sleep environment ( Bedroom between 60 and 67 degrees, turn off bright lights , TV or device screens screens , consider black out curtains or white noise machines) Do not drive if sleepy. Remember to clean mask, tubing, filter, and reservoir once weekly with soapy water.  Follow up with Dr. Elsworth Soho  In 6 months  or before as needed.    Please contact office for sooner follow up if symptoms do not improve or worsen or seek emergency care   Please stop smoking. I have sent in a Chanticx starter pack.  Take as directed.  Please contact office for sooner follow up if symptoms do not improve or worsen or seek emergency care

## 2021-02-08 ENCOUNTER — Ambulatory Visit (INDEPENDENT_AMBULATORY_CARE_PROVIDER_SITE_OTHER): Payer: Medicare Other | Admitting: Licensed Clinical Social Worker

## 2021-02-08 DIAGNOSIS — R443 Hallucinations, unspecified: Secondary | ICD-10-CM

## 2021-02-08 DIAGNOSIS — F4310 Post-traumatic stress disorder, unspecified: Secondary | ICD-10-CM | POA: Diagnosis not present

## 2021-02-08 DIAGNOSIS — F0630 Mood disorder due to known physiological condition, unspecified: Secondary | ICD-10-CM | POA: Diagnosis not present

## 2021-02-08 DIAGNOSIS — F411 Generalized anxiety disorder: Secondary | ICD-10-CM | POA: Diagnosis not present

## 2021-02-08 NOTE — Progress Notes (Signed)
Virtual Visit via Video Note  I connected with Joan [name] on 02/08/21 at  2:00 PM EST by a video enabled telemedicine application and verified that I am speaking with the correct person using two identifiers.  Location: Patient: stationary car Provider: home office   I discussed the limitations of evaluation and management by telemedicine and the availability of in person appointments. The patient expressed understanding and agreed to proceed.   I discussed the assessment and treatment plan with the patient. The patient was provided an opportunity to ask questions and all were answered. The patient agreed with the plan and demonstrated an understanding of the instructions.   The patient was advised to call back or seek an in-person evaluation if the symptoms worsen or if the condition fails to improve as anticipated.  I provided 53 minutes of non-face-to-face time during this encounter.   THERAPIST PROGRESS NOTE  Session Time: 2:00 PM to 2:53 PM  Participation Level: Active  Behavioral Response: CasualAlertEuthymic  Type of Therapy: Individual Therapy  Treatment Goals addressed:  PTSD, coping, wants to work on where she is right now and work through her childhood when things stabilize, she noted anxiety depression stress management with dealing with things right now Interventions: Solution Focused, Strength-based, Supportive, and Other: coping  Summary: Joan [name] is a 60 y.o. female who presents with loves it at new place, nice, clean, every one takes showers. Liked the last place but as time went on saw more things and didn't want to live there. Roommate didn't take a shower or wash anything. Pointed out there that she needed to wash because she was stinky and that would make the room stinky. Patient said couldn't do it when in bathroom she used the same towel and wash clothes. She would touch different things in kitchen, she would sit down in places that patient  set up for herself. Patient didn't want to touch or be anyway other patients touched things. See other patient not being clean and hygenic. At new place finally settling in. Noticed tons of medical supplies and medicines.  Scribes the place to therapist so therapist gets a better idea that its a house with rooms. In her her room could be for two people and another fit in. Right now has it to herself. Room was going to take with private bathroom likes the room in now more. Needed more room for her things.  Took a shower and ran some errands went shopping instead of ordering in went in the actual store to get things needed. Breathing is not as bad as at brother's. Thinks it was stress and depression, black mold. Highly allergic to mold, trees, grass. Used to love to color, build puzzle, crochet. Couldn't do before everything took a lot of work. Feels really good to know that she can do that and want to do that. A big exhale for her. As time goes on maybe going deeper in therapy. Feels rapport with therapist helpful to working on things. Needed to talk about things in house to function and focus. Can't move forward if don't deal with things in the house. Worked on what was right at the time at the moment. There was like a block. Patient relates can't go backwards if don't start as an adult and surpassing what she needs to and be able to focus. What needed to discuss. Phone call from [facility] apartments. Before couldn't do anything because so stuck. Now being in [place] relief to be out  of house. Things building up again grossed "skezzed" out with other patients. Where she is right now able to taking care of herself. At [facility] gave her time to send paperwork needed to send. Didn't get it done. Told them a tough year end worst kicked out of brother's moved to [place] for assisted care didn't work and then moved to [employer]. Told her get paperwork on 5-7 days. It is an apartment. Rent is cheaper.   Patient commits to getting it done today.  Explored patient's stability and moved in new place December 4. Still need a little more stability before dive into deeper things. Doesn't smoke as much. Not a candidate for exercise program lungs aren't good enough. Maybe in time she can. Has Chantix. All smoking in car not in the house. Also cannot smoke because there is oxygen also not allowed in the house. Sister invited to their house told them go. Getting along with two sisters mainly [name] other one [name]. Talked about kids love all kids but Laney her granddaughter gave her more time.  Position to positive thing patient treats once a month takes herself out to eat. Or buy something special. Can't only afford to do that once a month. Money situation right now tight, tighter than needs to be. Can do what she wants to do and doesn't hear she is lazy don't have to hear it from somebody and can say and do what she wants.  Patient able to appreciate some of the reason she is happier at new place.  Therapist reviewed symptoms, facilitated expression of thoughts and feeling and noted significant improvement for patient with her feedback that she loves where she is now.  She is going out and doing more things therapist pointing out that you can tell a lot about what is going on for people's behavior so her motivation shows she is feeling better.  Explored with patient her struggles at her brother's made things difficult so appreciates now things of changed less stress that has significant impact on her life.  Noted working on stabilization so she can work on deeper issues with therapist.  Patient recognizing that was not feasible when day-to-day overwhelmed with stressors to work on deeper issues and needing therapist interventions to help her with day-to-day issues at the time was a priority.  Therapist supported patient with continued progress noting possibilities of getting into a place that is apartment, noting  patient strengths of being her best advocate to put her in this position despite the challenges of her medical condition really indicates her strengths.  Therapist provided active listening open questions, supportive interventions. Suicidal/Homicidal: No  Plan: Return again in 2 weeks.2. Therapist help patient process feelings to help her making healthy choices and manage feelings and stressors, work on issues of the past as they come up and session, utilize supportive and strength-based intervention  Diagnosis: Axis I: PTSD, hallucinations, GAD, Mood Disorder in conditions classified elsewhere    Axis II: No diagnosis    [name], LCSW 02/08/2021

## 2021-02-09 ENCOUNTER — Encounter: Payer: Self-pay | Admitting: Pulmonary Disease

## 2021-02-09 ENCOUNTER — Ambulatory Visit (INDEPENDENT_AMBULATORY_CARE_PROVIDER_SITE_OTHER): Payer: Medicare Other | Admitting: *Deleted

## 2021-02-09 DIAGNOSIS — J9611 Chronic respiratory failure with hypoxia: Secondary | ICD-10-CM

## 2021-02-09 DIAGNOSIS — J449 Chronic obstructive pulmonary disease, unspecified: Secondary | ICD-10-CM

## 2021-02-09 DIAGNOSIS — R051 Acute cough: Secondary | ICD-10-CM

## 2021-02-12 ENCOUNTER — Telehealth: Payer: Self-pay | Admitting: Pulmonary Disease

## 2021-02-12 ENCOUNTER — Other Ambulatory Visit: Payer: Self-pay | Admitting: Pulmonary Disease

## 2021-02-12 DIAGNOSIS — J9611 Chronic respiratory failure with hypoxia: Secondary | ICD-10-CM

## 2021-02-12 DIAGNOSIS — R051 Acute cough: Secondary | ICD-10-CM

## 2021-02-12 NOTE — Progress Notes (Signed)
Needed updated order placed for Nebulizer since it was sent under Rx refill per PCC's. New order place for provider to sign.

## 2021-02-12 NOTE — Chronic Care Management (AMB) (Signed)
Chronic Care Management    Clinical Social Work Note  02/12/2021 Name: Aniylah [name] MRN: [id number] DOB: [birth year]  Joan [name] is a 60 y.o. year old female who is a primary care patient of Joan Salina Real Cons, MD. The CCM team was consulted to assist the patient with chronic disease management and/or care coordination needs related to: [employer]  and Level of Care Concerns.   Engaged with patient by telephone for follow up visit in response to provider referral for social work chronic care management and care coordination services.   Consent to Services:  The patient was given information about Chronic Care Management services, agreed to services, and gave verbal consent prior to initiation of services.  Please see initial visit note for detailed documentation.   Patient agreed to services and consent obtained.   Assessment: Review of patient past medical history, allergies, medications, and health status, including review of relevant consultants reports was performed today as part of a comprehensive evaluation and provision of chronic care management and care coordination services.     SDOH (Social Determinants of Health) assessments and interventions performed:    Advanced Directives Status: Not addressed in this encounter.  CCM Care Plan  Allergies  Allergen Reactions   Keflex [Cephalexin] Rash and Other (See Comments)    Has tolerated amoxicillin since had keflex   Prednisone Other (See Comments)    "counter reacts"   Shellfish Allergy Shortness Of Breath, Nausea And Vomiting and Other (See Comments)    Stomach cramps   Tetracyclines & Related Anaphylaxis, Swelling and Other (See Comments)    Throat swelling requiring hospitalization   Dilaudid [Hydromorphone Hcl] Other (See Comments)    "Lethargy"   Incruse Ellipta [Umeclidinium Bromide] Nausea Only   Other    Tuberculin Tests Other (See Comments)    False postive    Outpatient Encounter  Medications as of 02/09/2021  Medication Sig   acyclovir (ZOVIRAX) 400 MG tablet TAKE 1 TABLET BY MOUTH  TWICE DAILY   albuterol (PROVENTIL) (2.5 MG/3ML) 0.083% nebulizer solution Take 3 mLs (2.5 mg total) by nebulization 2 (two) times daily. And every 6 hours as needed.  DX: J44.9   atenolol (TENORMIN) 25 MG tablet Take 25 mg by mouth daily.   atorvastatin (LIPITOR) 20 MG tablet TAKE 1 TABLET BY MOUTH  DAILY   bisoprolol (ZEBETA) 5 MG tablet Take 1 tablet (5 mg total) by mouth daily. (Patient not taking: Reported on 02/07/2021)   Budeson-Glycopyrrol-Formoterol (BREZTRI AEROSPHERE) 160-9-4.8 MCG/ACT AERO Inhale 2 puffs into the lungs in the morning and at bedtime.   Budeson-Glycopyrrol-Formoterol (BREZTRI AEROSPHERE) 160-9-4.8 MCG/ACT AERO Inhale 2 puffs into the lungs in the morning and at bedtime.   buPROPion (WELLBUTRIN XL) 150 MG 24 hr tablet TAKE 1 TABLET BY MOUTH  DAILY   busPIRone (BUSPAR) 5 MG tablet TAKE 1/2 TO 1 TABLET BY MOUTH  TWICE DAILY AS NEEDED   Camphor-Menthol-Methyl Sal 3.03-02-08 % PTCH Apply 1 patch topically daily as needed (pain).   cyclobenzaprine (FLEXERIL) 10 MG tablet Take 10 mg by mouth at bedtime.   hydrochlorothiazide (HYDRODIURIL) 25 MG tablet TAKE 1 TABLET BY MOUTH  DAILY   Ipratropium-Albuterol (COMBIVENT RESPIMAT) 20-100 MCG/ACT AERS respimat Inhale 1 puff into the lungs every 6 (six) hours as needed for wheezing or shortness of breath.   levothyroxine (SYNTHROID) 25 MCG tablet TAKE 1 TABLET BY MOUTH  DAILY BEFORE BREAKFAST   Liniments (SALONPAS ARTHRITIS PAIN RELIEF EX) Apply 1 patch topically daily as needed (to  painful sites- remove as directed).    montelukast (SINGULAIR) 10 MG tablet TAKE 1 TABLET BY MOUTH AT  BEDTIME   mupirocin cream (BACTROBAN) 2 % Apply topically daily. (Patient not taking: Reported on 02/07/2021)   nystatin-triamcinolone ointment (MYCOLOG) Apply 1 application topically 2 (two) times daily as needed (applied to skin folds/groins/under  breast skin irritation rash.).   OXYGEN Inhale 2-3 L/min into the lungs See admin instructions. Inhale  2-3 L/min of oxygen into the lungs w/CPAP at bedtime and as needed for shortness of breath with "strenuous activity" during the day   pantoprazole (PROTONIX) 40 MG tablet TAKE 1 TABLET BY MOUTH  DAILY   PATADAY 0.2 % SOLN Apply 1 drop to eye daily.   polyethylene glycol (MIRALAX / GLYCOLAX) 17 g packet Take 17 g by mouth daily as needed for mild constipation.   pregabalin (LYRICA) 150 MG capsule TAKE 1 CAPSULE BY MOUTH AT  BEDTIME   QUEtiapine (SEROQUEL) 50 MG tablet TAKE 1 TABLET BY MOUTH AT  BEDTIME   roflumilast (DALIRESP) 500 MCG TABS tablet Take 1 tablet (500 mcg total) by mouth daily.   traZODone (DESYREL) 100 MG tablet TAKE 1 TABLET BY MOUTH AT  BEDTIME   varenicline (CHANTIX PAK) 0.5 MG X 11 & 1 MG X 42 tablet Take one 0.5 mg tablet by mouth once daily for 3 days, then increase to one 0.5 mg tablet twice daily for 4 days, then increase to one 1 mg tablet twice daily.   venlafaxine XR (EFFEXOR-XR) 150 MG 24 hr capsule TAKE 1 CAPSULE BY MOUTH  DAILY WITH BREAKFAST   witch hazel-glycerin (TUCKS) pad Apply topically as needed for itching.   No facility-administered encounter medications on file as of 02/09/2021.    Patient Active Problem List   Diagnosis Date Noted   Depression 08/16/2020   PTSD (post-traumatic stress disorder) 02/09/2020   Mood disorder in conditions classified elsewhere 02/09/2020   Generalized anxiety disorder 02/09/2020   Type II diabetes mellitus with manifestations ([place]) 10/29/2019   Coarse tremors 10/28/2019   Chronic rhinitis 10/21/2019   Allergic rhinitis 06/16/2019   Cervical spondylosis without myelopathy 12/18/2018   Hypertension    Encounter for general adult medical examination with abnormal findings 11/06/2018   Smokers' cough ([place]) 01/15/2018   Hypomagnesemia 12/29/2017   Pulmonary nodule 11/11/2017   Carotid artery disease ([place]) 11/05/2017    OSA on CPAP 08/07/2017   Gastroesophageal reflux disease 06/23/2017   Hypothyroidism due to acquired atrophy of thyroid 04/05/2017   Primary osteoarthritis involving multiple joints 04/05/2017   Chronic constipation 02/10/2017   Iron deficiency anemia due to dietary causes 02/10/2017   Osteoarthritis 02/10/2017   Hyperlipidemia associated with type 2 diabetes mellitus ([place]) 02/10/2017   Chronic respiratory failure with hypoxia ([place]) 12/16/2016   Essential hypertension 11/29/2016   Vocal cord dysfunction    COPD, severe ([place]) 09/24/2016   Anxiety 09/24/2016    Conditions to be addressed/monitored: COPD; Level of care concerns and Lacks knowledge of community resource:    Care Plan : [place]  Updates made by Joan Peer, LCSW since 02/12/2021 12:00 AM     Problem: Need for patient-centered plan of care to promote improved mental health and overal quality of life.   Priority: High     Long-Range Goal: Develop self management plan to improve pt's mental health and overall health/quality of life. Completed 02/12/2021  Start Date: 05/09/2020  Expected End Date: 02/23/2021  This Visit's Progress: On track  Recent  Progress: On track  Priority: High  Note:   Current barriers:   Patient in need of assistance with connecting to community resources for Limited social support, Housing barriers, Mental Health Concerns, Family and relationship dysfunction, and lacks knowledge of community resource: housing. Acknowledges deficits with meeting this unmet need Patient is unable to independently navigate community resource options without care coordination support Patient with limited ability to complete applications for housing due to reported reading limitations Clinical Goals:  patient will work with SW to address concerns related to mental health support/needs patient will work with Care Guide  to address needs related to housing and other community needs as identified patient  will follow up with Psychiatrist and Counselor to further discuss concerns and needs as directed by SW Clinical Interventions:    Pt reports she is overwhelmed with her recent move out of the ALF in [place] to an apartment in [place]- she thinks this home will be becoming an ALF in the future.   CSW advised pt of plans to sign off- pt understands and can reach out if further needs arise.    Collaboration with [name], MD regarding development and update of comprehensive plan of care as evidenced by provider attestation and co-signature Pt reports continued mental health support/sessions with her outpatient therapist and denies any current significant depression/anxiety. Pt denies SI/HI Inter-disciplinary care team collaboration (see longitudinal plan of care) Assessment of needs, barriers , agencies contacted, as well as how impacting  Review various resources, discussed options and provided patient information about Financial constraints related to housing, etc, Limited social support, Housing barriers, Mental Health Concerns , and Family and relationship dysfunction Collaborated with appropriate clinical care team members regarding patient needs Financial constraints related to housing, Limited social support, Housing barriers, Mental Health Concerns , and Family and relationship dysfunction, COPD, Anxiety, and Depression Patient interviewed and appropriate assessments performed Referred patient to community resources care guide team for assistance with housing and other community support/resources as identified Provided mental health counseling with regard to pt concerns/needs (mental health diagnosis or concern) Discussed plans with patient for ongoing care management follow up and provided patient with direct contact information for care management team Other interventions provided: Motivational Interviewing Solution-Focused Strategies Brief CBT Emotional/Supportive  Counseling Psychoeducation and/or Health Education Problem Solving Patient Goals:   - call 911 when I need emergency help - follow-up on any referrals for help I am given and seek housing options on my own in the local paper and community - think ahead to make sure my need does not become an emergency - make a note about what I need to have by the phone or take with me, like an identification card or social security number have a back-up plan - have a back-up plan - make a list of family or friends that I can call  -follow up with counselor and Psychiatrist at scheduled appointment times to further discuss mental health concerns/needs for more frequent visits as discussed -make Neurologist aware of concerns related to memory issues, recent stuttering,etc -consider seeing a Nutritionist/RD for healthier diet/choices  Follow Up Plan: No further follow up planned at this time      Follow Up Plan: Client will reach out to CSW if further needs arise      . Eduard Clos MSW, LCSW Licensed Clinical Social Worker [facility] [place] [phone]

## 2021-02-12 NOTE — Patient Instructions (Signed)
Visit Information  Thank you for taking time to visit with me today. Please don't hesitate to contact me if I can be of assistance to you .    Please call the care guide team at [phone] if you need to cancel or reschedule your appointment.   If you are experiencing a Mental Health or [place] or need someone to talk to, please call the Suicide and Crisis Lifeline: 988 go to [facility] [place] [phone]) call 911   The patient verbalized understanding of instructions, educational materials, and care plan provided today and declined offer to receive copy of patient instructions, educational materials, and care plan.   Eduard Clos MSW, LCSW Licensed Clinical Social Worker [facility] [place] [phone]

## 2021-02-13 ENCOUNTER — Telehealth: Payer: Self-pay | Admitting: Pulmonary Disease

## 2021-02-13 MED ORDER — BREZTRI AEROSPHERE 160-9-4.8 MCG/ACT IN AERO: 2.0000 | INHALATION_SPRAY | Freq: Two times a day (BID) | RESPIRATORY_TRACT | 3 refills | Status: DC

## 2021-02-13 NOTE — Telephone Encounter (Signed)
LMTCB

## 2021-02-13 NOTE — Telephone Encounter (Signed)
Forms for Joan [name] were filled out and rx printed and placed in C pod you Dr Elsworth Soho to sign  Please let us know when you have signed and we will fax, thanks!

## 2021-02-14 NOTE — Telephone Encounter (Signed)
Per April, the forms have been faxed to [facility]  I called and made pt aware

## 2021-02-14 NOTE — Telephone Encounter (Signed)
Spoke with the pt  She is asking for POC order to [employer]  She states she already spoke with them and that they will provide her with one, they just need an order  She has been with them more than 6 months, so I advised they may not honor this order if we send She states this is incorrect based on what she was told  Dr Elsworth Soho, please advise if okay with sending POC order, thanks!

## 2021-02-16 MED ORDER — NYSTATIN 100000 UNIT/ML MT SUSP: 5.0000 mL | Freq: Four times a day (QID) | OROMUCOSAL | 0 refills | Status: DC

## 2021-02-16 MED ORDER — METHYLPREDNISOLONE 4 MG PO TBPK: ORAL_TABLET | ORAL | 0 refills | Status: DC

## 2021-02-16 NOTE — Telephone Encounter (Signed)
I sent in nystatin s/s for thrush symptoms and medrol dose pack for COPD exacerbation. If not better needs visit with Dr. Elsworth Soho or Judson Roch

## 2021-02-16 NOTE — Telephone Encounter (Signed)
Beth, please see mychart message from pt and advise: [facility] (supporting [name], MD) 10 hours ago (10:30 PM)   Yes it's called swish and swallow for the thrust. the other medication is  solue- medrol, it is to help with my breathing to make it better it is in the prednisone family. [name] and Dr Elsworth Soho know what they are. I don't quite why you are asking these questions anyway if you would talk with Judson Roch or Dr Gwenlyn Perking please they will know thank you    Other messages have been sent by pt as well but Barnet Pall has already taken care of the DME order.

## 2021-02-21 ENCOUNTER — Ambulatory Visit (INDEPENDENT_AMBULATORY_CARE_PROVIDER_SITE_OTHER): Payer: Medicare Other | Admitting: Licensed Clinical Social Worker

## 2021-02-21 DIAGNOSIS — F4310 Post-traumatic stress disorder, unspecified: Secondary | ICD-10-CM

## 2021-02-21 DIAGNOSIS — F0630 Mood disorder due to known physiological condition, unspecified: Secondary | ICD-10-CM | POA: Diagnosis not present

## 2021-02-21 DIAGNOSIS — R443 Hallucinations, unspecified: Secondary | ICD-10-CM

## 2021-02-21 DIAGNOSIS — F411 Generalized anxiety disorder: Secondary | ICD-10-CM | POA: Diagnosis not present

## 2021-02-21 NOTE — Progress Notes (Signed)
Virtual Visit via Video Note  I connected with [name] on 02/21/21 at  2:00 PM EST by a video enabled telemedicine application and verified that I am speaking with the correct person using two identifiers.  Location: Patient: stationary car Provider: home office   I discussed the limitations of evaluation and management by telemedicine and the availability of in person appointments. The patient expressed understanding and agreed to proceed.   I discussed the assessment and treatment plan with the patient. The patient was provided an opportunity to ask questions and all were answered. The patient agreed with the plan and demonstrated an understanding of the instructions.   The patient was advised to call back or seek an in-person evaluation if the symptoms worsen or if the condition fails to improve as anticipated.  I provided 55 minutes of non-face-to-face time during this encounter.  THERAPIST PROGRESS NOTE  Session Time: 2:00 PM to 2:55 PM  Participation Level: Active  Behavioral Response: CasualAlertDysphoric  Type of Therapy: Individual Therapy  Treatment Goals addressed:  PTSD, coping, wants to work on where she is right now and work through her childhood when things stabilize, she noted anxiety depression stress management with dealing with things right now Interventions: Solution Focused, Strength-based, Supportive, and Other: coping  Summary: Joan [name] is a 60 y.o. female who presents with not good not feeling good at all. Went out Christmas Day to sister's house. Knew probably shouldn't have gone but invited her and accepted so she went. Day after Christmas hit her really cold bundled up. Now feeling great pains in her head, with these headaches had to hold her head to cough. They had the flu, coronavirus, cold and they didn't air out house. They didn't clean. Wasn't feeling good was like don't bother her, breathing bad asked for Prednisone from doctor.  All wanted to do was sleep. Nothing to do wiht lung condition patient believes, coughing up fluid. Salmeterol is the actual medication she cannot take prednisone allergic to it but in the family also has taken Aleve PM when started now hot and cold and annoying. Going to give it until Friday over the weekend if not feeling better will have to see doctor or ER. Usually get pneumonia  couple times in the winter, breathing was affected and needed to get something for that. Doesn't think virus but cold may have picked up at sisters. Wants to get back into spn Environmental health practitioner he teaches the Bible. A really good church. Noted some memories of living brother and some of the things he said, therapist noted negative things. Thank goodness friend helping her almost out of there. Loves her own place. It is house with common areas. Dining room, kitchen, kitchenette, living room. Likes her room can put things up room for her things. Has a desk and shelves along the wall. Two desks two people can fit in there.  Put together a list of things that are missing. Blood pressure cuff missing and thermometer, a scale. Family lying about things encompassing different things including missing items. Once she thinks out of home won't worry about them. [name] and [name] won't see her don't think like her that much. That comes when she went to [place]. When moved down said didn't finish conversation about moving in with [name] and  [name] and her son Remo Lipps with his daughter Janace Hoard his wife all lived all in the house. [name] said to come. Sold all her stuff to friend and ripped her off.  When she got there said they did not finish her conversation about moving in and patient asked why they had not told her before until she moved there.  Does not understand that and therapist agreed.  She then went to [place] didn't work out said they told her can't come back and patient said all my stuff is there. Raided her stuff. For Christmas Azzie Glatter and Remo Lipps and his two kids Janace Hoard and Melvina. Got along and enjoyed her day. [name] and [name] doing their own thing. [name] moved up this way didn't work out with [name] She wanted to get away from [place] then they moved this way.  Patient only talks with [name] when they run into each other.  Told her sister things and tells the other sister and patient says didn't care telling the truth. [name] told [name] to stay in her room to not have run ins. Patient said are you doing it? why would you do that? [name] blamed on [name] and Kaja but not true. Patient didn't say anything bad about [name], it was more coming from [place]. Didn't know much about her left the house at 18-19. Went out to [place]. Didn't know about [name] and [name] really. Patient says she keeps to herself stays to herself call [name] once in awhile tries not to be nuisance, or they call her. If do call always a reason can't be on the phone-[name]. Also said she would help patient clean but then said couldn't but cleaning [name] and brother' place. Patient says from now on not asking figure it out one way or other, do herself even if takes awhile.  Therapist noted how low down and negative family seems and patient agrees saying they treat her like a doormat deals with her out of pity patient said doesn't want their pity. Doesn't need that doesn't need them. Now then she moved there don't think will hear for them. Right now struggling with money won't do better until she is on feet. [name] not being honest about what she spent on patient's food stamps. Has been burned by family not burned out.  This expressed her hope the patient will rest and patient says doing laundry straighten up room has to do it last two couldn't do it.  Therapist reviewed symptoms, facilitated expression of thoughts and feelings utilize as treatment intervention to help patient cope with feelings and stressors.  Noted patient's sick therapist  provided compassionate type intervention as well as encouraging patient to get help noted she did get something for lungs which was important to managing symptoms.  Noted patient wants to get back to watching services therapist that she was impressed and noted this is a strength for patient.  Discussed stresses of her family ways they lie, do not help her out, ways that patient may not want to be so connected as can expect things to evolve in a negative way and patient says she does feel burned out and does see that she needs to keep her distance.  Noted with new how she will have ways to interact and therapist thinks this is significant to have social connection with the common areas.  Therapist provided active listening open questions supportive interventions Suicidal/Homicidal: No  Plan: Return again in 2 weeks.2.Therapist help patient process feelings to help her making healthy choices and manage feelings and stressors, work on issues of the past as they come up and session, utilize supportive and strength-based intervention  Diagnosis: Axis I: PTSD, hallucinations, GAD,  Mood Disorder in conditions classified elsewhere    Axis II: No diagnosis    [name], LCSW 02/21/2021

## 2021-02-23 DIAGNOSIS — J449 Chronic obstructive pulmonary disease, unspecified: Secondary | ICD-10-CM

## 2021-02-23 NOTE — Telephone Encounter (Signed)
Attempted to call pt but unable to reach. Left message for her to return call. 

## 2021-02-23 NOTE — Telephone Encounter (Signed)
Patient is returning a call. °

## 2021-02-24 DIAGNOSIS — J449 Chronic obstructive pulmonary disease, unspecified: Secondary | ICD-10-CM | POA: Diagnosis not present

## 2021-03-01 ENCOUNTER — Telehealth: Payer: Self-pay

## 2021-03-01 NOTE — Progress Notes (Signed)
Chronic Care Management Pharmacy Assistant   Name: Joan [name]  MRN: [id number] DOB: [birth year]   Reason for Encounter: Disease State   Conditions to be addressed/monitored: COPD  Recent office visits:  12/19/20 [name], MD-PCP (Chronic respiratory failure with hypoxia) No orders, or med changes  Recent consult visits:  02/07/21 [name], NP-Pulmonary Disease (Tobacco Abuse)   Hospital visits:  None in previous 6 months  Medications: Outpatient Encounter Medications as of 03/01/2021  Medication Sig   acyclovir (ZOVIRAX) 400 MG tablet TAKE 1 TABLET BY MOUTH  TWICE DAILY   albuterol (PROVENTIL) (2.5 MG/3ML) 0.083% nebulizer solution Take 3 mLs (2.5 mg total) by nebulization 2 (two) times daily. And every 6 hours as needed.  DX: J44.9   atenolol (TENORMIN) 25 MG tablet Take 25 mg by mouth daily.   atorvastatin (LIPITOR) 20 MG tablet TAKE 1 TABLET BY MOUTH  DAILY   bisoprolol (ZEBETA) 5 MG tablet Take 1 tablet (5 mg total) by mouth daily. (Patient not taking: Reported on 02/07/2021)   Budeson-Glycopyrrol-Formoterol (BREZTRI AEROSPHERE) 160-9-4.8 MCG/ACT AERO Inhale 2 puffs into the lungs in the morning and at bedtime.   Budeson-Glycopyrrol-Formoterol (BREZTRI AEROSPHERE) 160-9-4.8 MCG/ACT AERO Inhale 2 puffs into the lungs in the morning and at bedtime.   Budeson-Glycopyrrol-Formoterol (BREZTRI AEROSPHERE) 160-9-4.8 MCG/ACT AERO Inhale 2 puffs into the lungs in the morning and at bedtime.   buPROPion (WELLBUTRIN XL) 150 MG 24 hr tablet TAKE 1 TABLET BY MOUTH  DAILY   busPIRone (BUSPAR) 5 MG tablet TAKE 1/2 TO 1 TABLET BY MOUTH  TWICE DAILY AS NEEDED   Camphor-Menthol-Methyl Sal 3.03-02-08 % PTCH Apply 1 patch topically daily as needed (pain).   cyclobenzaprine (FLEXERIL) 10 MG tablet Take 10 mg by mouth at bedtime.   hydrochlorothiazide (HYDRODIURIL) 25 MG tablet TAKE 1 TABLET BY MOUTH  DAILY   Ipratropium-Albuterol (COMBIVENT RESPIMAT) 20-100 MCG/ACT AERS  respimat Inhale 1 puff into the lungs every 6 (six) hours as needed for wheezing or shortness of breath.   levothyroxine (SYNTHROID) 25 MCG tablet TAKE 1 TABLET BY MOUTH  DAILY BEFORE BREAKFAST   Liniments (SALONPAS ARTHRITIS PAIN RELIEF EX) Apply 1 patch topically daily as needed (to painful sites- remove as directed).    methylPREDNISolone (MEDROL DOSEPAK) 4 MG TBPK tablet As directed   montelukast (SINGULAIR) 10 MG tablet TAKE 1 TABLET BY MOUTH AT  BEDTIME   mupirocin cream (BACTROBAN) 2 % Apply topically daily. (Patient not taking: Reported on 02/07/2021)   nystatin (MYCOSTATIN) 100000 UNIT/ML suspension Take 5 mLs (500,000 Units total) by mouth 4 (four) times daily.   nystatin-triamcinolone ointment (MYCOLOG) Apply 1 application topically 2 (two) times daily as needed (applied to skin folds/groins/under breast skin irritation rash.).   OXYGEN Inhale 2-3 L/min into the lungs See admin instructions. Inhale  2-3 L/min of oxygen into the lungs w/CPAP at bedtime and as needed for shortness of breath with "strenuous activity" during the day   pantoprazole (PROTONIX) 40 MG tablet TAKE 1 TABLET BY MOUTH  DAILY   PATADAY 0.2 % SOLN Apply 1 drop to eye daily.   polyethylene glycol (MIRALAX / GLYCOLAX) 17 g packet Take 17 g by mouth daily as needed for mild constipation.   pregabalin (LYRICA) 150 MG capsule TAKE 1 CAPSULE BY MOUTH AT  BEDTIME   QUEtiapine (SEROQUEL) 50 MG tablet TAKE 1 TABLET BY MOUTH AT  BEDTIME   roflumilast (DALIRESP) 500 MCG TABS tablet Take 1 tablet (500 mcg total) by mouth daily.  traZODone (DESYREL) 100 MG tablet TAKE 1 TABLET BY MOUTH AT  BEDTIME   varenicline (CHANTIX PAK) 0.5 MG X 11 & 1 MG X 42 tablet Take one 0.5 mg tablet by mouth once daily for 3 days, then increase to one 0.5 mg tablet twice daily for 4 days, then increase to one 1 mg tablet twice daily.   venlafaxine XR (EFFEXOR-XR) 150 MG 24 hr capsule TAKE 1 CAPSULE BY MOUTH  DAILY WITH BREAKFAST   witch hazel-glycerin  (TUCKS) pad Apply topically as needed for itching.   No facility-administered encounter medications on file as of 03/01/2021.    CAT Score 04/21/2020  Total CAT Score 39  Current COPD regimen: Breztri 160-9-4.8 mcg/act, Proventil 2.5 mg/ml, Combivent 20-100 mcg/act  Any recent hospitalizations or ED visits since last visit with CPP? No   Reports COPD symptoms, including Increased shortness of breath , wheezing  Have you had exacerbation/flare-up since last visit? No  What do you do when you are short of breath?  Rest, use inhaler  Respiratory Devices/Equipment Do you have a nebulizer? No Do you use a Peak Flow Meter? No Do you use a maintenance inhaler? Yes How often do you forget to use your daily inhaler? Patient has been using her inhaler more often now Do you use a rescue inhaler? Yes How often do you use your rescue inhaler?   Patient states when she is short of breath Do you use a spacer with your inhaler? Yes  Adherence Review: Does the patient have >5 day gap between last estimated fill date for maintenance inhaler medications? No   Care Gaps: Colonoscopy-11/24/18 Diabetic Foot Exam-03/07/20 Mammogram-06/08/19 Ophthalmology-NA Dexa Scan - NA Annual Well Visit - NA Micro albumin-03/07/20 Hemoglobin A1c- 08/09/20  Star Rating Drugs: Atorvastatin 20 mg-last fill 02/17/21 90 ds  Ethelene Hal Clinical Pharmacist Assistant [phone]

## 2021-03-01 NOTE — Telephone Encounter (Signed)
Called and spoke to pt. Pt states she called [employer] yesterday, 02/28/21, and was told they have all the needed information but will have someone from the neb dept reach out to pt to give her a status update. Pt states she hasnt heard from anyone yet. Advised pt to call us back if she doesn't get a call from [employer] by midday Friday 1/6. Pt verbalized understanding. Will close email and open phone note if pt calls tomorrow.

## 2021-03-02 ENCOUNTER — Telehealth: Payer: Self-pay | Admitting: Pulmonary Disease

## 2021-03-02 ENCOUNTER — Other Ambulatory Visit: Payer: Self-pay

## 2021-03-02 MED ORDER — COMBIVENT RESPIMAT 20-100 MCG/ACT IN AERS: 1.0000 | INHALATION_SPRAY | Freq: Four times a day (QID) | RESPIRATORY_TRACT | 11 refills | Status: DC | PRN

## 2021-03-05 NOTE — Telephone Encounter (Signed)
Patient is returning a call. °

## 2021-03-05 NOTE — Telephone Encounter (Signed)
LMTCB

## 2021-03-06 ENCOUNTER — Telehealth: Payer: Self-pay

## 2021-03-06 MED ORDER — PREGABALIN 150 MG PO CAPS: 150.0000 mg | ORAL_CAPSULE | Freq: Every day | ORAL | 0 refills | Status: DC

## 2021-03-06 NOTE — Telephone Encounter (Signed)
Pt is requesting a refill for: pregabalin (LYRICA) 150 MG capsule  LOV: 12/19/20 Pt want one time fill at: [facility]  [address] [id number]  Pt wants future scripts send to [facility] .

## 2021-03-06 NOTE — Telephone Encounter (Signed)
See below

## 2021-03-07 DIAGNOSIS — J9611 Chronic respiratory failure with hypoxia: Secondary | ICD-10-CM | POA: Diagnosis not present

## 2021-03-07 DIAGNOSIS — G4733 Obstructive sleep apnea (adult) (pediatric): Secondary | ICD-10-CM | POA: Diagnosis not present

## 2021-03-07 DIAGNOSIS — J449 Chronic obstructive pulmonary disease, unspecified: Secondary | ICD-10-CM | POA: Diagnosis not present

## 2021-03-07 DIAGNOSIS — J441 Chronic obstructive pulmonary disease with (acute) exacerbation: Secondary | ICD-10-CM | POA: Diagnosis not present

## 2021-03-08 ENCOUNTER — Ambulatory Visit (INDEPENDENT_AMBULATORY_CARE_PROVIDER_SITE_OTHER): Payer: Medicare Other | Admitting: Licensed Clinical Social Worker

## 2021-03-08 DIAGNOSIS — R443 Hallucinations, unspecified: Secondary | ICD-10-CM

## 2021-03-08 DIAGNOSIS — F411 Generalized anxiety disorder: Secondary | ICD-10-CM

## 2021-03-08 DIAGNOSIS — F4310 Post-traumatic stress disorder, unspecified: Secondary | ICD-10-CM | POA: Diagnosis not present

## 2021-03-08 DIAGNOSIS — F0630 Mood disorder due to known physiological condition, unspecified: Secondary | ICD-10-CM | POA: Diagnosis not present

## 2021-03-08 NOTE — Telephone Encounter (Signed)
Called [employer] but was on a long hold. Per the recording, [employer] is averaging 15 days to process the applications.

## 2021-03-08 NOTE — Telephone Encounter (Signed)
Called and spoke to pt. Pt states she dropped off pt assistance paperwork for Combivent. She states she did this at least two weeks ago and she is wanting to know the status of this.

## 2021-03-08 NOTE — Progress Notes (Signed)
Virtual Visit via Video Note  I connected with Joan [name] on 03/08/21 at  2:00 PM EST by a video enabled telemedicine application and verified that I am speaking with the correct person using two identifiers.  Location: Patient: home Provider: home office   I discussed the limitations of evaluation and management by telemedicine and the availability of in person appointments. The patient expressed understanding and agreed to proceed.  I discussed the assessment and treatment plan with the patient. The patient was provided an opportunity to ask questions and all were answered. The patient agreed with the plan and demonstrated an understanding of the instructions.   The patient was advised to call back or seek an in-person evaluation if the symptoms worsen or if the condition fails to improve as anticipated.  I provided 54 minutes of non-face-to-face time during this encounter.  THERAPIST PROGRESS NOTE  Session Time: 2:00 PM to 2:54 PM  Participation Level: Active  Behavioral Response: CasualAlertEuthymic  Type of Therapy: Individual Therapy  Treatment Goals addressed:  PTSD, coping, wants to work on where she is right now and work through her childhood when things stabilize, she noted anxiety depression stress management with dealing with things right now Interventions: Solution Focused, Strength-based, Supportive, and Other: coping  Summary: Joan [name] is a 61 y.o. female who presents with has furniture from brother's. Says things are coming along. A few items missing. A few items still at his place and "a little pissed off" doesn't know where sheet set is and they are new. Feels he has them and taken them. List sent to him. Feels gives her things away her food. Doesn't want any relationship with him describes him as "that nut bag". Once get all stuff has a mouthful for him. Called [name], her daughter, for money. Asking her to contact sister and brother other two  kids chip in to help him financially can't afford to keep helping patient out. Patient says doesn't need help patient pays for her own things. She describes him as a Public librarian to get money. Why never wanted money from him. He would volunteer money but then wants it back and throws in her face. Doesn't want money from him will deal with her own situation. Transitioned to talking about her new place. Loves it here. Eventually will be assisted living can continue rent go up and that is fine. Called [facility] give you a hand up. Can live there if need help will assist you. Nice room plenty of room for her stuff. Wants to organize her room a mess and everything everywhere. Collects cherub angels. Questions whether she wants to stay thinks going to look for apartment and storage. Not right now has to save. A place for people to go before assisting living come here and have to take care of yourself. There will be cooks and nurses to dispense medications. Explored whether she felt lonely was more lonely other place lived with brother and didn't bother with her and when did it was negative. Why felt so alone negative environment bring her down. Wants to light candle smell like linen. Clothes on [employer]. Going out a little bit. Finding herself had to return a couple of things. Actually went shopping need to go to [employer] for cleaning products. Going to see lung doctor Monday PCP next Thursday. Has some questions with the lung doctor. Haven't been feeling right, not to the point of being in bed all the time but can't smell right, coughing  more than usual not usual cough. Pulling up yellow junk, a lot of fluid. Thinks needs a steroid again, has helped. Back to where was. Doesn't want to live on steroid. Not feeling good with lungs, PCP needs to give he rshots supposed to get from lung doctor. Hopes doesn't have COVID. With flu get pneumonia this is different with constant cough. Asked therapist if she has ever feel out  of place, feeling guilty, something should do and should be doing something else doesn't know what that is so in the middle of what does she do. End of driveway knows not to be end of the smoking but owner knows. Weird feeling maybe has to settle in more. Should feel a sense of belonging in her own room but doesn't know what that feeling is should be doing something else but doesn't know what that is. Maybe clean the room. Sense of not belonging. Everything she did at brother's was wrong maybe why feel like that. When use drugs and alcohol and use a lot patient says it takes a long time to feel good again like having 100% of mind, but says forgets everything never get all of it back, Patient shares that things come to you and time goes on realize more. Time goes on start to remember things when using don't care to remember. Things haven't felt in years. Patient says have to learn to live with it and grow with it look at positive at it instead of negative. Turn negative into positive.  Patient said tough sometimes because feelings are uncomfortable we will continue explore at next session.  Looked at different possibilities variances of recovery, possibly history of not feeling comfortable and belonging, continue to explore  Therapist reviewed symptoms, facilitated expression of thoughts and feeling utilize expression of feelings as important intervention to help patient processed through patient's feelings related to conflict with brother.  Therapist validated her with some of the dynamics happening that had to deal with.  Noted helps her feel comfortable in the space she is in and therapist noted this is a good sign.  Noted positive signs such as patient getting out a little more as well.  Noted positive patient using sense and therapist related that to mental health helps with mood also getting out helps with mood so positive she is doing it but also signing feeling better.  Talked about medical stressors and  patient addressing those promptly is good.  Noted at end of session patient talking about not being at ease uncomfortable feeling she should be doing something out and explored that with patient to see with the sources whether it is from her past, may be related to being with her brother, therapist also mention this comes after putting down drugs and alcohol and feeling your feelings which is ultimately beneficial for growth but not always easy experience we will continue to explore with patient at next session.  Suicidal/Homicidal: No  Plan: Return again in 2 weeks.2.  Continue to explore patient's feelings in session for insight that will help with coping, stress management and mood management  Diagnosis: Axis I: PTSD, hallucinations, GAD, Mood Disorder in conditions classified elsewhere    Axis II: No diagnosis    [name], LCSW 03/08/2021

## 2021-03-08 NOTE — Telephone Encounter (Signed)
See pt email from 12/30. Will close encounter.

## 2021-03-10 DIAGNOSIS — J441 Chronic obstructive pulmonary disease with (acute) exacerbation: Secondary | ICD-10-CM | POA: Diagnosis not present

## 2021-03-10 DIAGNOSIS — J9611 Chronic respiratory failure with hypoxia: Secondary | ICD-10-CM | POA: Diagnosis not present

## 2021-03-10 DIAGNOSIS — J449 Chronic obstructive pulmonary disease, unspecified: Secondary | ICD-10-CM | POA: Diagnosis not present

## 2021-03-12 ENCOUNTER — Ambulatory Visit: Payer: Medicare Other | Admitting: Acute Care

## 2021-03-12 ENCOUNTER — Telehealth: Payer: Self-pay | Admitting: Acute Care

## 2021-03-12 NOTE — Telephone Encounter (Signed)
CPAP down Load reviewed. Patient used 25 of 30 days. ( 83%) AHI is 1.4 on 10 cm H2O Dates reviewed 02/07/2021-03/08/2021 Average use 5 hours 6 minutes. Call patient and let her know her CPAP is controlling her OSA when she wears it. See if she can aim to wear it every night.  Compliance is at 83%. Thanks

## 2021-03-12 NOTE — Telephone Encounter (Signed)
Called and spoke with pt letting her know the info per SG about her recent CPAP download and she verbalized understanding. Nothing further needed.

## 2021-03-13 ENCOUNTER — Inpatient Hospital Stay
Admission: EM | Admit: 2021-03-13 | Discharge: 2021-03-19 | DRG: 202 | Disposition: A | Discharge status: Home Health | Payer: Medicare Other | Attending: Hospitalist | Admitting: Hospitalist

## 2021-03-13 ENCOUNTER — Emergency Department: Payer: Medicare Other

## 2021-03-13 ENCOUNTER — Other Ambulatory Visit: Payer: Self-pay

## 2021-03-13 DIAGNOSIS — R0603 Acute respiratory distress: Secondary | ICD-10-CM | POA: Diagnosis not present

## 2021-03-13 DIAGNOSIS — Z881 Allergy status to other antibiotic agents status: Secondary | ICD-10-CM

## 2021-03-13 DIAGNOSIS — R069 Unspecified abnormalities of breathing: Secondary | ICD-10-CM | POA: Diagnosis not present

## 2021-03-13 DIAGNOSIS — E7800 Pure hypercholesterolemia, unspecified: Secondary | ICD-10-CM | POA: Diagnosis present

## 2021-03-13 DIAGNOSIS — E039 Hypothyroidism, unspecified: Secondary | ICD-10-CM | POA: Diagnosis present

## 2021-03-13 DIAGNOSIS — Z79890 Hormone replacement therapy (postmenopausal): Secondary | ICD-10-CM

## 2021-03-13 DIAGNOSIS — T380X5A Adverse effect of glucocorticoids and synthetic analogues, initial encounter: Secondary | ICD-10-CM | POA: Diagnosis not present

## 2021-03-13 DIAGNOSIS — F17210 Nicotine dependence, cigarettes, uncomplicated: Secondary | ICD-10-CM | POA: Diagnosis not present

## 2021-03-13 DIAGNOSIS — Z8619 Personal history of other infectious and parasitic diseases: Secondary | ICD-10-CM | POA: Diagnosis not present

## 2021-03-13 DIAGNOSIS — Z23 Encounter for immunization: Secondary | ICD-10-CM

## 2021-03-13 DIAGNOSIS — Z20822 Contact with and (suspected) exposure to covid-19: Secondary | ICD-10-CM | POA: Diagnosis present

## 2021-03-13 DIAGNOSIS — E878 Other disorders of electrolyte and fluid balance, not elsewhere classified: Secondary | ICD-10-CM | POA: Diagnosis present

## 2021-03-13 DIAGNOSIS — J45901 Unspecified asthma with (acute) exacerbation: Secondary | ICD-10-CM | POA: Diagnosis present

## 2021-03-13 DIAGNOSIS — Z888 Allergy status to other drugs, medicaments and biological substances status: Secondary | ICD-10-CM

## 2021-03-13 DIAGNOSIS — E876 Hypokalemia: Secondary | ICD-10-CM | POA: Diagnosis present

## 2021-03-13 DIAGNOSIS — Z833 Family history of diabetes mellitus: Secondary | ICD-10-CM

## 2021-03-13 DIAGNOSIS — J441 Chronic obstructive pulmonary disease with (acute) exacerbation: Principal | ICD-10-CM | POA: Diagnosis present

## 2021-03-13 DIAGNOSIS — E1165 Type 2 diabetes mellitus with hyperglycemia: Secondary | ICD-10-CM | POA: Diagnosis present

## 2021-03-13 DIAGNOSIS — K219 Gastro-esophageal reflux disease without esophagitis: Secondary | ICD-10-CM | POA: Diagnosis present

## 2021-03-13 DIAGNOSIS — Z7951 Long term (current) use of inhaled steroids: Secondary | ICD-10-CM

## 2021-03-13 DIAGNOSIS — J449 Chronic obstructive pulmonary disease, unspecified: Secondary | ICD-10-CM | POA: Diagnosis not present

## 2021-03-13 DIAGNOSIS — Z743 Need for continuous supervision: Secondary | ICD-10-CM | POA: Diagnosis not present

## 2021-03-13 DIAGNOSIS — I10 Essential (primary) hypertension: Secondary | ICD-10-CM | POA: Diagnosis not present

## 2021-03-13 DIAGNOSIS — J209 Acute bronchitis, unspecified: Secondary | ICD-10-CM | POA: Diagnosis not present

## 2021-03-13 DIAGNOSIS — E1142 Type 2 diabetes mellitus with diabetic polyneuropathy: Secondary | ICD-10-CM | POA: Diagnosis present

## 2021-03-13 DIAGNOSIS — R0602 Shortness of breath: Secondary | ICD-10-CM

## 2021-03-13 DIAGNOSIS — Z9981 Dependence on supplemental oxygen: Secondary | ICD-10-CM

## 2021-03-13 DIAGNOSIS — J9610 Chronic respiratory failure, unspecified whether with hypoxia or hypercapnia: Secondary | ICD-10-CM | POA: Diagnosis not present

## 2021-03-13 DIAGNOSIS — J440 Chronic obstructive pulmonary disease with acute lower respiratory infection: Secondary | ICD-10-CM | POA: Diagnosis present

## 2021-03-13 DIAGNOSIS — Z8249 Family history of ischemic heart disease and other diseases of the circulatory system: Secondary | ICD-10-CM

## 2021-03-13 DIAGNOSIS — Z79899 Other long term (current) drug therapy: Secondary | ICD-10-CM

## 2021-03-13 DIAGNOSIS — Z885 Allergy status to narcotic agent status: Secondary | ICD-10-CM

## 2021-03-13 DIAGNOSIS — F319 Bipolar disorder, unspecified: Secondary | ICD-10-CM | POA: Diagnosis present

## 2021-03-13 DIAGNOSIS — G4733 Obstructive sleep apnea (adult) (pediatric): Secondary | ICD-10-CM | POA: Diagnosis present

## 2021-03-13 DIAGNOSIS — Z96643 Presence of artificial hip joint, bilateral: Secondary | ICD-10-CM | POA: Diagnosis present

## 2021-03-13 DIAGNOSIS — Z801 Family history of malignant neoplasm of trachea, bronchus and lung: Secondary | ICD-10-CM

## 2021-03-13 DIAGNOSIS — F411 Generalized anxiety disorder: Secondary | ICD-10-CM | POA: Diagnosis present

## 2021-03-13 LAB — CBC WITH DIFFERENTIAL/PLATELET
Abs Immature Granulocytes: 0.05 10*3/uL (ref 0.00–0.07)
Basophils Absolute: 0.1 10*3/uL (ref 0.0–0.1)
Basophils Relative: 1 %
Eosinophils Absolute: 0.5 10*3/uL (ref 0.0–0.5)
Eosinophils Relative: 4 %
HCT: 39.7 % (ref 36.0–46.0)
Hemoglobin: 13.4 g/dL (ref 12.0–15.0)
Immature Granulocytes: 0 %
Lymphocytes Relative: 26 %
Lymphs Abs: 3 10*3/uL (ref 0.7–4.0)
MCH: 30 pg (ref 26.0–34.0)
MCHC: 33.8 g/dL (ref 30.0–36.0)
MCV: 89 fL (ref 80.0–100.0)
Monocytes Absolute: 0.8 10*3/uL (ref 0.1–1.0)
Monocytes Relative: 7 %
Neutro Abs: 6.9 10*3/uL (ref 1.7–7.7)
Neutrophils Relative %: 62 %
Platelets: 392 10*3/uL (ref 150–400)
RBC: 4.46 MIL/uL (ref 3.87–5.11)
RDW: 14.8 % (ref 11.5–15.5)
WBC: 11.4 10*3/uL — ABNORMAL HIGH (ref 4.0–10.5)
nRBC: 0 % (ref 0.0–0.2)

## 2021-03-13 MED ORDER — METHYLPREDNISOLONE SODIUM SUCC 125 MG IJ SOLR
125.0000 mg | Freq: Once | INTRAMUSCULAR | Status: AC
Start: 2021-03-13 — End: 2021-03-13
  Administered 2021-03-13: 125 mg via INTRAVENOUS
  Filled 2021-03-13: qty 2

## 2021-03-13 MED ORDER — IPRATROPIUM-ALBUTEROL 0.5-2.5 (3) MG/3ML IN SOLN
3.0000 mL | Freq: Once | RESPIRATORY_TRACT | Status: AC
  Administered 2021-03-13: 3 mL via RESPIRATORY_TRACT
  Filled 2021-03-13: qty 3

## 2021-03-13 NOTE — ED Notes (Signed)
X-ray at bedside

## 2021-03-13 NOTE — ED Notes (Signed)
ED Provider at bedside. 

## 2021-03-13 NOTE — ED Triage Notes (Signed)
Per EMS, C/o sob x 2 weeks. Worse last few days. Gave herself breathing treatment. No relief. On 3 liters [place] at baseline  Hx COPD, emphysema

## 2021-03-13 NOTE — ED Provider Notes (Signed)
[facility] Provider Note    Event Date/Time   First MD Initiated Contact with Patient 03/13/21 2302     (approximate)   History   Shortness of Breath   HPI  Joan [name] is a 61 y.o. female brought to the ED from home via EMS with a chief complaint of shortness of breath.  Patient has a history of COPD on 3 L nasal cannula oxygen at baseline.  Reports shortness of breath times the past 2 weeks, worse over the last 3 days.  Using nebulizers at home without relief of symptoms.  Endorses productive cough.  Denies fever, chest pain, abdominal pain, nausea, vomiting or dizziness.  Patient is vaccinated but not yet boosted for COVID-19.     Past Medical History   Past Medical History:  Diagnosis Date   Acute respiratory failure with hypoxia ([place])    Alcoholism and drug addiction in family    Anxiety    Appendicitis    Arthritis    "knees, hips, lower back" (09/25/2016)   Asthma    Bipolar disorder ([place])    Chronic bronchitis ([place])    "all the time" (09/25/2016)   Chronic leg pain    RLE   Chronic lower back pain    COPD (chronic obstructive pulmonary disease) ([place])    Depression    Diabetes mellitus without complication ([place])    Diabetic neuropathy ([place])    Early cataracts, bilateral    Emphysema of lung ([place])    Generalized anxiety disorder 02/09/2020   GERD (gastroesophageal reflux disease)    Headache    Hepatitis C    "treated back in 2001-2002"   High cholesterol    Hypertension    Hypothyroidism    Incarcerated ventral hernia 04/04/2017   Mood disorder in conditions classified elsewhere 02/09/2020   On home oxygen therapy    "2L; at nighttime" (04/04/2017)   OSA on CPAP    CPAP with Oxygen 2 liters   Paranoid ([place])    Pneumonia    "all the time" (09/25/2016)   Positive PPD    with negative chest x ray   PTSD (post-traumatic stress disorder) 02/09/2020   Tachycardia    patient reports with exertion ; denies any other conditions     Thyroid disease    Wears glasses    Wears partial dentures      Active Problem List   Patient Active Problem List   Diagnosis Date Noted   Depression 08/16/2020   PTSD (post-traumatic stress disorder) 02/09/2020   Mood disorder in conditions classified elsewhere 02/09/2020   Generalized anxiety disorder 02/09/2020   Type II diabetes mellitus with manifestations ([place]) 10/29/2019   Coarse tremors 10/28/2019   Chronic rhinitis 10/21/2019   Allergic rhinitis 06/16/2019   Cervical spondylosis without myelopathy 12/18/2018   Hypertension    Encounter for general adult medical examination with abnormal findings 11/06/2018   Smokers' cough ([place]) 01/15/2018   Hypomagnesemia 12/29/2017   Pulmonary nodule 11/11/2017   Carotid artery disease ([place]) 11/05/2017   OSA on CPAP 08/07/2017   Gastroesophageal reflux disease 06/23/2017   Hypothyroidism due to acquired atrophy of thyroid 04/05/2017   Primary osteoarthritis involving multiple joints 04/05/2017   Chronic constipation 02/10/2017   Iron deficiency anemia due to dietary causes 02/10/2017   Osteoarthritis 02/10/2017   Hyperlipidemia associated with type 2 diabetes mellitus ([place]) 02/10/2017   Chronic respiratory failure with hypoxia ([place]) 12/16/2016   Essential hypertension 11/29/2016   Vocal cord dysfunction  COPD, severe ([place]) 09/24/2016   Anxiety 09/24/2016     Past Surgical History   Past Surgical History:  Procedure Laterality Date   BACK SURGERY     BIOPSY  11/24/2018   Procedure: BIOPSY;  Surgeon: Thornton Park, MD;  Location: [facility];  Service: Gastroenterology;;   CARDIAC CATHETERIZATION     COLONOSCOPY     COLONOSCOPY WITH PROPOFOL N/A 11/24/2018   Procedure: COLONOSCOPY WITH PROPOFOL;  Surgeon: Thornton Park, MD;  [facility]: [facility];  Service: Gastroenterology;  Laterality: N/A;   CYST EXCISION     removal of cyst in sinuses   DILATION AND CURETTAGE OF UTERUS     ESOPHAGOGASTRODUODENOSCOPY (EGD)  WITH PROPOFOL N/A 11/24/2018   Procedure: ESOPHAGOGASTRODUODENOSCOPY (EGD) WITH PROPOFOL;  Surgeon: Thornton Park, MD;  [facility];  Service: Gastroenterology;  Laterality: N/A;   FOOT SURGERY Bilateral    bone removed from 4th and 5 th toes and put in pin then took pin out   [place]  ~ 2014/2015   "removed mesh & infection"   [place] N/A 04/04/2017   Procedure: LAPAROSCOPIC INCISIONAL HERNIA REPAIR WITH MESH;  Surgeon: Fanny Skates, MD;  Location: [place];  Service: General;  Laterality: N/A;   LACERATION REPAIR Right    repair wrist artery from laceration from ice skate   LAPAROSCOPIC APPENDECTOMY N/A 03/31/2019   Procedure: APPENDECTOMY LAPAROSCOPIC;  Surgeon: Ralene Ok, MD;  [facility];  Service: General;  Laterality: N/A;   LAPAROSCOPIC APPENDECTOMY N/A 07/09/2019   Procedure: APPENDECTOMY LAPAROSCOPIC;  Surgeon: Ralene Ok, MD;  Location: [place];  Service: General;  Laterality: N/A;   LAPAROSCOPIC INCISIONAL / UMBILICAL / [place]  04/04/2017   LAPAROSCOPIC INCISIONAL HERNIA REPAIR WITH MESH   LIPOMA EXCISION Right    fattty lipoma in neck   NASAL SEPTUM SURGERY     POSTERIOR LUMBAR FUSION  2015/2016 X 2   "added rods and screws"   SINOSCOPY     TOTAL HIP ARTHROPLASTY Right 02/05/2017   Procedure: RIGHT TOTAL HIP ARTHROPLASTY;  Surgeon: [name], MD;  [facility];  Service: Orthopedics;  Laterality: Right;   TOTAL HIP ARTHROPLASTY Left 06/18/2017   Procedure: LEFT TOTAL HIP ARTHROPLASTY;  Surgeon: [name], MD;  [facility];  Service: Orthopedics;  Laterality: Left;   TOTAL HIP REVISION Left 01/26/2019   Procedure: TOTAL HIP REVISION;  Surgeon: Paralee Cancel, MD;  Location[facility];  Service: Orthopedics;  Laterality: Left;  2 hrs   TUBAL LIGATION     UMBILICAL HERNIA REPAIR  ~ 2013   w/mesh   UPPER GI ENDOSCOPY       Home Medications   Prior to Admission  medications   Medication Sig Start Date End Date Taking? Authorizing Provider  acyclovir (ZOVIRAX) 400 MG tablet TAKE 1 TABLET BY MOUTH  TWICE DAILY 11/10/20   [name], MD  albuterol (PROVENTIL) (2.5 MG/3ML) 0.083% nebulizer solution Take 3 mLs (2.5 mg total) by nebulization 2 (two) times daily. And every 6 hours as needed.  DX: J44.9 01/05/21   [name], NP  atenolol (TENORMIN) 25 MG tablet Take 25 mg by mouth daily. 09/10/20   [provider]  atorvastatin (LIPITOR) 20 MG tablet TAKE 1 TABLET BY MOUTH  DAILY 01/17/21   [name], MD  bisoprolol (ZEBETA) 5 MG tablet Take 1 tablet (5 mg total) by mouth daily. Patient not taking: Reported on 02/07/2021 08/19/20  Samuella Cota, MD  Budeson-Glycopyrrol-Formoterol (BREZTRI AEROSPHERE) 160-9-4.8 MCG/ACT AERO Inhale 2 puffs into the lungs in the morning and at bedtime. 01/05/21   [name], NP  Budeson-Glycopyrrol-Formoterol (BREZTRI AEROSPHERE) 160-9-4.8 MCG/ACT AERO Inhale 2 puffs into the lungs in the morning and at bedtime. 02/07/21   [name], NP  Budeson-Glycopyrrol-Formoterol (BREZTRI AEROSPHERE) 160-9-4.8 MCG/ACT AERO Inhale 2 puffs into the lungs in the morning and at bedtime. 02/13/21   [name], MD  buPROPion (WELLBUTRIN XL) 150 MG 24 hr tablet TAKE 1 TABLET BY MOUTH  DAILY 01/17/21   [name], MD  busPIRone (BUSPAR) 5 MG tablet TAKE 1/2 TO 1 TABLET BY MOUTH  TWICE DAILY AS NEEDED 01/17/21   [name], MD  Camphor-Menthol-Methyl Sal 3.03-02-08 % PTCH Apply 1 patch topically daily as needed (pain).    [provider]  cyclobenzaprine (FLEXERIL) 10 MG tablet Take 10 mg by mouth at bedtime. 11/09/20   [provider]  hydrochlorothiazide (HYDRODIURIL) 25 MG tablet TAKE 1 TABLET BY MOUTH  DAILY 01/17/21   [name], MD  Ipratropium-Albuterol (COMBIVENT RESPIMAT) 20-100 MCG/ACT AERS respimat Inhale 1 puff into the lungs every 6 (six) hours as needed for  wheezing or shortness of breath. 03/02/21   [name], MD  levothyroxine (SYNTHROID) 25 MCG tablet TAKE 1 TABLET BY MOUTH  DAILY BEFORE BREAKFAST 01/17/21   [name], MD  [facility] ARTHRITIS PAIN RELIEF EX) Apply 1 patch topically daily as needed (to painful sites- remove as directed).     [provider]  methylPREDNISolone (MEDROL DOSEPAK) 4 MG TBPK tablet As directed 02/16/21   Martyn Ehrich, NP  montelukast (SINGULAIR) 10 MG tablet TAKE 1 TABLET BY MOUTH AT  BEDTIME 01/17/21   [name], MD  mupirocin cream (BACTROBAN) 2 % Apply topically daily. Patient not taking: Reported on 02/07/2021 08/19/20   Samuella Cota, MD  nystatin (MYCOSTATIN) 100000 UNIT/ML suspension Take 5 mLs (500,000 Units total) by mouth 4 (four) times daily. 02/16/21   Martyn Ehrich, NP  nystatin-triamcinolone ointment [facility]) Apply 1 application topically 2 (two) times daily as needed (applied to skin folds/groins/under breast skin irritation rash.). 09/08/17   [name], MD  OXYGEN Inhale 2-3 L/min into the lungs See admin instructions. Inhale  2-3 L/min of oxygen into the lungs w/CPAP at bedtime and as needed for shortness of breath with "strenuous activity" during the day    [provider]  pantoprazole (PROTONIX) 40 MG tablet TAKE 1 TABLET BY MOUTH  DAILY 01/17/21   [name], MD  PATADAY 0.2 % SOLN Apply 1 drop to eye daily. 08/20/19   [provider]  polyethylene glycol (MIRALAX / GLYCOLAX) 17 g packet Take 17 g by mouth daily as needed for mild constipation. 04/14/19   Meuth, Brooke A, PA-C  pregabalin (LYRICA) 150 MG capsule Take 1 capsule (150 mg total) by mouth at bedtime. 03/06/21   [name], MD  QUEtiapine (SEROQUEL) 50 MG tablet TAKE 1 TABLET BY MOUTH AT  BEDTIME 01/17/21   [name], MD  roflumilast (DALIRESP) 500 MCG TABS tablet Take 1 tablet (500 mcg total) by mouth daily. 11/06/18   [name],  NP  traZODone (DESYREL) 100 MG tablet TAKE 1 TABLET BY MOUTH AT  BEDTIME 01/17/21   [name], MD  varenicline (CHANTIX PAK) 0.5 MG X 11 & 1 MG X 42 tablet Take one 0.5 mg tablet by mouth once  daily for 3 days, then increase to one 0.5 mg tablet twice daily for 4 days, then increase to one 1 mg tablet twice daily. 02/07/21   [name], NP  venlafaxine XR (EFFEXOR-XR) 150 MG 24 hr capsule TAKE 1 CAPSULE BY MOUTH  DAILY WITH BREAKFAST 01/17/21   [name], MD  witch hazel-glycerin (TUCKS) pad Apply topically as needed for itching. 08/18/20   Samuella Cota, MD     Allergies  Keflex [cephalexin], Prednisone, Shellfish allergy, Tetracyclines & related, Dilaudid [hydromorphone hcl], Incruse ellipta [umeclidinium bromide], Other, and Tuberculin tests   Family History   Family History  Problem Relation Age of Onset   Diabetes Mother    Hypertension Mother    Hypertension Father    Cancer Father        lung     Physical Exam  Triage Vital Signs: ED Triage Vitals  Enc Vitals Group     BP --      Pulse Rate 03/13/21 2243 97     Resp 03/13/21 2244 20     Temp 03/13/21 2243 98.7 F (37.1 C)     Temp Source 03/13/21 2243 Oral     SpO2 03/13/21 2243 96 %     Weight 03/13/21 2243 199 lb (90.3 kg)     Height 03/13/21 2243 5\' 8"  (1.727 m)     Head Circumference --      Peak Flow --      Pain Score 03/13/21 2244 5     Pain Loc --      Pain Edu? --      Excl. in [place]? --     Updated Vital Signs: BP 119/67    Pulse 80    Temp 98.7 F (37.1 C) (Oral)    Resp 17    Ht 5\' 8"  (1.727 m)    Wt 90.3 kg    SpO2 97%    BMI 30.26 kg/m    General: Awake, mild distress.  CV:  Good peripheral perfusion.  Resp:  Increased effort.  No retractions.  Scattered wheezing and rhonchi. Abd:  No distention.  Nontender. Other:  No calf tenderness or leg swelling.   ED Results / Procedures / Treatments  Labs (all labs ordered are listed, but only abnormal results are  displayed) Labs Reviewed  CBC WITH DIFFERENTIAL/PLATELET - Abnormal; Notable for the following components:      Result Value   WBC 11.4 (*)    All other components within normal limits  COMPREHENSIVE METABOLIC PANEL - Abnormal; Notable for the following components:   Potassium 2.9 (*)    Chloride 96 (*)    Glucose, Bld 118 (*)    Creatinine, Ser 1.09 (*)    GFR, Estimated 59 (*)    All other components within normal limits  RESP PANEL BY RT-PCR (FLU A&B, COVID) ARPGX2  BRAIN NATRIURETIC PEPTIDE  TROPONIN I (HIGH SENSITIVITY)  TROPONIN I (HIGH SENSITIVITY)     EKG  ED ECG REPORT I, Surya Folden J, the attending physician, personally viewed and interpreted this ECG.   Date: 03/13/2021  EKG Time: 2310  Rate: 86  Rhythm: normal sinus rhythm  Axis: Normal  Intervals:none  ST&T Change: Nonspecific    RADIOLOGY I have personally reviewed patient's chest x-ray as well as the radiology interpretation:  Chest x-ray: Mild COPD, no acute cardiopulmonary process  Official radiology report(s): DG Chest Port 1 View  Result Date: 03/13/2021 CLINICAL DATA:  Shortness of breath for 2 weeks,  initial encounter EXAM: PORTABLE CHEST 1 VIEW COMPARISON:  08/10/2020 FINDINGS: Cardiac shadow is within normal limits. Aortic calcifications are noted. Lungs are mildly hyperinflated. No bony abnormality is seen. IMPRESSION: Mild COPD without acute abnormality. Electronically Signed   By: Inez Catalina M.D.   On: 03/13/2021 23:32     PROCEDURES:  Critical Care performed: No  .1-3 Lead EKG Interpretation Performed by: Paulette Blanch, MD Authorized by: Paulette Blanch, MD     Interpretation: normal     ECG rate:  85   ECG rate assessment: normal     Rhythm: sinus rhythm     Ectopy: none     Conduction: normal   Comments:     Patient placed on cardiac monitor to evaluate for arrhythmias   MEDICATIONS ORDERED IN ED: Medications  ipratropium-albuterol (DUONEB) 0.5-2.5 (3) MG/3ML nebulizer  solution 3 mL (has no administration in time range)  ipratropium-albuterol (DUONEB) 0.5-2.5 (3) MG/3ML nebulizer solution 3 mL (3 mLs Nebulization Given 03/13/21 2349)  methylPREDNISolone sodium succinate (SOLU-MEDROL) 125 mg/2 mL injection 125 mg (125 mg Intravenous Given 03/13/21 2349)  potassium chloride SA (KLOR-CON M) CR tablet 40 mEq (40 mEq Oral Given 03/14/21 0052)  ipratropium-albuterol (DUONEB) 0.5-2.5 (3) MG/3ML nebulizer solution 3 mL (3 mLs Nebulization Given 03/14/21 0053)     IMPRESSION / MDM / ASSESSMENT AND PLAN / ED COURSE  I reviewed the triage vital signs and the nursing notes.                             61 year old female with COPD presenting with shortness of breath. Differential includes, but is not limited to, viral syndrome, bronchitis including COPD exacerbation, pneumonia, reactive airway disease including asthma, CHF including exacerbation with or without pulmonary/interstitial edema, pneumothorax, ACS, thoracic trauma, and pulmonary embolism.  I have personally reviewed patient's charts, in particular her pulmonology office visit from 02/07/2021.  Will obtain cardiac panel, chest x-ray.  Administer 125 mg IV Solu-Medrol, DuoNeb.  Will reassess.  The patient is on the cardiac monitor to evaluate for evidence of arrhythmia and/or significant heart rate changes.  Laboratory results demonstrate minimal leukocytosis, mild hypokalemia, negative respiratory panel, negative troponin.  Chest x-ray without pneumonia.   Clinical Course as of 03/14/21 0145  Wed Mar 14, 2021  0037 Updated patient on laboratory and imaging results.  Will replete potassium orally.  Remains tachypneic with wheezing.  Will administer second DuoNeb and reassess. [JS]  0144 Remains tachypneic with wheezing.  Will administer third DuoNeb.  Will consult hospitalist services for evaluation and admission for COPD exacerbation. [JS]    Clinical Course User Index [JS] Paulette Blanch, MD     FINAL  CLINICAL IMPRESSION(S) / ED DIAGNOSES   Final diagnoses:  COPD exacerbation ([place])  Shortness of breath  Hypokalemia     Rx / DC Orders   ED Discharge Orders     None        Note:  This document was prepared using Dragon voice recognition software and may include unintentional dictation errors.   Paulette Blanch, MD 03/14/21 [phone]

## 2021-03-14 ENCOUNTER — Encounter (HOSPITAL_COMMUNITY): Payer: Self-pay | Admitting: Psychiatry

## 2021-03-14 ENCOUNTER — Encounter: Payer: Self-pay | Admitting: Family Medicine

## 2021-03-14 ENCOUNTER — Telehealth (INDEPENDENT_AMBULATORY_CARE_PROVIDER_SITE_OTHER): Payer: Medicare Other | Admitting: Psychiatry

## 2021-03-14 DIAGNOSIS — R0603 Acute respiratory distress: Secondary | ICD-10-CM | POA: Diagnosis present

## 2021-03-14 DIAGNOSIS — J441 Chronic obstructive pulmonary disease with (acute) exacerbation: Secondary | ICD-10-CM | POA: Diagnosis not present

## 2021-03-14 DIAGNOSIS — R5381 Other malaise: Secondary | ICD-10-CM | POA: Diagnosis not present

## 2021-03-14 DIAGNOSIS — Z23 Encounter for immunization: Secondary | ICD-10-CM | POA: Diagnosis not present

## 2021-03-14 DIAGNOSIS — F0630 Mood disorder due to known physiological condition, unspecified: Secondary | ICD-10-CM | POA: Diagnosis not present

## 2021-03-14 DIAGNOSIS — E039 Hypothyroidism, unspecified: Secondary | ICD-10-CM | POA: Diagnosis not present

## 2021-03-14 DIAGNOSIS — F17210 Nicotine dependence, cigarettes, uncomplicated: Secondary | ICD-10-CM | POA: Diagnosis present

## 2021-03-14 DIAGNOSIS — K219 Gastro-esophageal reflux disease without esophagitis: Secondary | ICD-10-CM | POA: Diagnosis present

## 2021-03-14 DIAGNOSIS — F411 Generalized anxiety disorder: Secondary | ICD-10-CM

## 2021-03-14 DIAGNOSIS — J9610 Chronic respiratory failure, unspecified whether with hypoxia or hypercapnia: Secondary | ICD-10-CM | POA: Diagnosis present

## 2021-03-14 DIAGNOSIS — F4310 Post-traumatic stress disorder, unspecified: Secondary | ICD-10-CM

## 2021-03-14 DIAGNOSIS — J45901 Unspecified asthma with (acute) exacerbation: Secondary | ICD-10-CM | POA: Diagnosis present

## 2021-03-14 DIAGNOSIS — Z8249 Family history of ischemic heart disease and other diseases of the circulatory system: Secondary | ICD-10-CM | POA: Diagnosis not present

## 2021-03-14 DIAGNOSIS — R531 Weakness: Secondary | ICD-10-CM | POA: Diagnosis not present

## 2021-03-14 DIAGNOSIS — Z20822 Contact with and (suspected) exposure to covid-19: Secondary | ICD-10-CM | POA: Diagnosis present

## 2021-03-14 DIAGNOSIS — Z743 Need for continuous supervision: Secondary | ICD-10-CM | POA: Diagnosis not present

## 2021-03-14 DIAGNOSIS — E876 Hypokalemia: Secondary | ICD-10-CM

## 2021-03-14 DIAGNOSIS — J440 Chronic obstructive pulmonary disease with acute lower respiratory infection: Secondary | ICD-10-CM

## 2021-03-14 DIAGNOSIS — Z7401 Bed confinement status: Secondary | ICD-10-CM | POA: Diagnosis not present

## 2021-03-14 DIAGNOSIS — R0602 Shortness of breath: Secondary | ICD-10-CM | POA: Diagnosis present

## 2021-03-14 DIAGNOSIS — F319 Bipolar disorder, unspecified: Secondary | ICD-10-CM | POA: Diagnosis present

## 2021-03-14 DIAGNOSIS — I10 Essential (primary) hypertension: Secondary | ICD-10-CM | POA: Diagnosis present

## 2021-03-14 DIAGNOSIS — J209 Acute bronchitis, unspecified: Secondary | ICD-10-CM | POA: Diagnosis not present

## 2021-03-14 DIAGNOSIS — E1165 Type 2 diabetes mellitus with hyperglycemia: Secondary | ICD-10-CM | POA: Diagnosis present

## 2021-03-14 DIAGNOSIS — Z8619 Personal history of other infectious and parasitic diseases: Secondary | ICD-10-CM | POA: Diagnosis not present

## 2021-03-14 DIAGNOSIS — E878 Other disorders of electrolyte and fluid balance, not elsewhere classified: Secondary | ICD-10-CM | POA: Diagnosis present

## 2021-03-14 DIAGNOSIS — E1142 Type 2 diabetes mellitus with diabetic polyneuropathy: Secondary | ICD-10-CM | POA: Diagnosis present

## 2021-03-14 DIAGNOSIS — Z9981 Dependence on supplemental oxygen: Secondary | ICD-10-CM | POA: Diagnosis not present

## 2021-03-14 DIAGNOSIS — E7800 Pure hypercholesterolemia, unspecified: Secondary | ICD-10-CM | POA: Diagnosis present

## 2021-03-14 DIAGNOSIS — G4733 Obstructive sleep apnea (adult) (pediatric): Secondary | ICD-10-CM | POA: Diagnosis present

## 2021-03-14 DIAGNOSIS — T380X5A Adverse effect of glucocorticoids and synthetic analogues, initial encounter: Secondary | ICD-10-CM | POA: Diagnosis present

## 2021-03-14 LAB — CBC
HCT: 39.4 % (ref 36.0–46.0)
Hemoglobin: 13 g/dL (ref 12.0–15.0)
MCH: 29.5 pg (ref 26.0–34.0)
MCHC: 33 g/dL (ref 30.0–36.0)
MCV: 89.5 fL (ref 80.0–100.0)
Platelets: 271 10*3/uL (ref 150–400)
RBC: 4.4 MIL/uL (ref 3.87–5.11)
RDW: 14.6 % (ref 11.5–15.5)
WBC: 7.2 10*3/uL (ref 4.0–10.5)
nRBC: 0 % (ref 0.0–0.2)

## 2021-03-14 LAB — RESP PANEL BY RT-PCR (FLU A&B, COVID) ARPGX2
Influenza A by PCR: NEGATIVE
Influenza B by PCR: NEGATIVE
SARS Coronavirus 2 by RT PCR: NEGATIVE

## 2021-03-14 LAB — BASIC METABOLIC PANEL
Anion gap: 10 (ref 5–15)
BUN: 13 mg/dL (ref 6–20)
CO2: 30 mmol/L (ref 22–32)
Calcium: 9.1 mg/dL (ref 8.9–10.3)
Chloride: 94 mmol/L — ABNORMAL LOW (ref 98–111)
Creatinine, Ser: 1.11 mg/dL — ABNORMAL HIGH (ref 0.44–1.00)
GFR, Estimated: 57 mL/min — ABNORMAL LOW (ref >60)
Glucose, Bld: 359 mg/dL — ABNORMAL HIGH (ref 70–99)
Potassium: 3.9 mmol/L (ref 3.5–5.1)
Sodium: 134 mmol/L — ABNORMAL LOW (ref 135–145)

## 2021-03-14 LAB — COMPREHENSIVE METABOLIC PANEL
ALT: 17 U/L (ref 0–44)
AST: 25 U/L (ref 15–41)
Albumin: 3.5 g/dL (ref 3.5–5.0)
Alkaline Phosphatase: 106 U/L (ref 38–126)
Anion gap: 10 (ref 5–15)
BUN: 11 mg/dL (ref 6–20)
CO2: 31 mmol/L (ref 22–32)
Calcium: 9.3 mg/dL (ref 8.9–10.3)
Chloride: 96 mmol/L — ABNORMAL LOW (ref 98–111)
Creatinine, Ser: 1.09 mg/dL — ABNORMAL HIGH (ref 0.44–1.00)
GFR, Estimated: 59 mL/min — ABNORMAL LOW (ref >60)
Glucose, Bld: 118 mg/dL — ABNORMAL HIGH (ref 70–99)
Potassium: 2.9 mmol/L — ABNORMAL LOW (ref 3.5–5.1)
Sodium: 137 mmol/L (ref 135–145)
Total Bilirubin: 0.8 mg/dL (ref 0.3–1.2)
Total Protein: 6.6 g/dL (ref 6.5–8.1)

## 2021-03-14 LAB — GLUCOSE, CAPILLARY
Glucose-Capillary: 346 mg/dL — ABNORMAL HIGH (ref 70–99)
Glucose-Capillary: 294 mg/dL — ABNORMAL HIGH (ref 70–99)
Glucose-Capillary: 136 mg/dL — ABNORMAL HIGH (ref 70–99)
Glucose-Capillary: 149 mg/dL — ABNORMAL HIGH (ref 70–99)
Glucose-Capillary: 187 mg/dL — ABNORMAL HIGH (ref 70–99)

## 2021-03-14 LAB — TROPONIN I (HIGH SENSITIVITY)
Troponin I (High Sensitivity): 2 ng/L (ref <18)
Troponin I (High Sensitivity): 5 ng/L (ref <18)

## 2021-03-14 LAB — HEMOGLOBIN A1C
Hgb A1c MFr Bld: 6.8 % — ABNORMAL HIGH (ref 4.8–5.6)
Mean Plasma Glucose: 148.46 mg/dL

## 2021-03-14 LAB — BRAIN NATRIURETIC PEPTIDE: B Natriuretic Peptide: 55.2 pg/mL (ref 0.0–100.0)

## 2021-03-14 IMAGING — CT CT CHEST LUNG CANCER SCREENING LOW DOSE W/O CM
2 of 5 series · 14 of 40 positions shown, 17 images · non-contrast
Comparison: Chest CT 11/28/2017.

CLINICAL DATA: 58-year-old female current smoker with 43 pack-year
history of smoking. Lung cancer screening examination.

EXAM:
CT CHEST WITHOUT CONTRAST LOW-DOSE FOR LUNG CANCER SCREENING
TECHNIQUE: Multidetector CT imaging of the chest was performed following the
standard protocol without IV contrast.

[Series 4: lung 1.00 br44 cor · coronal · 0.69mm/px · 3 of 302 slices shown]
[im 61/302  lung]
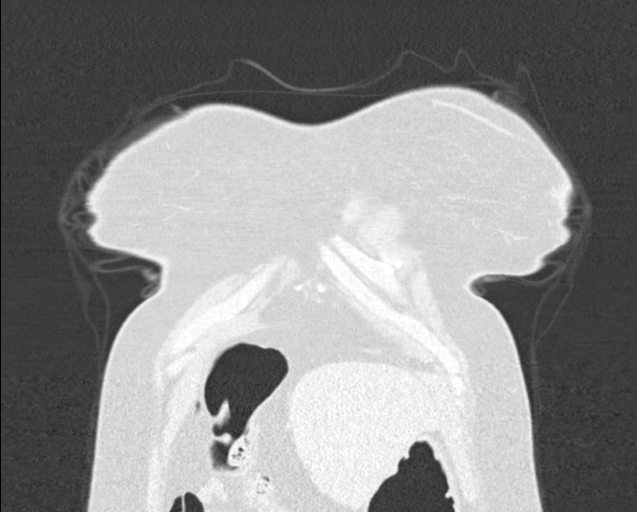
[im 121/302  lung]
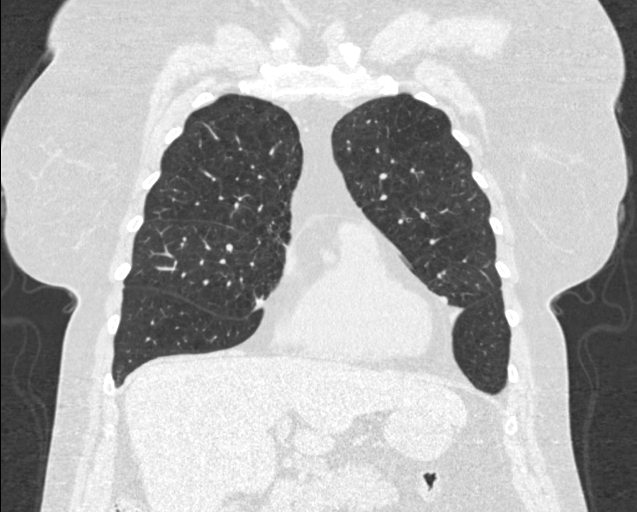
[im 181/302  lung]
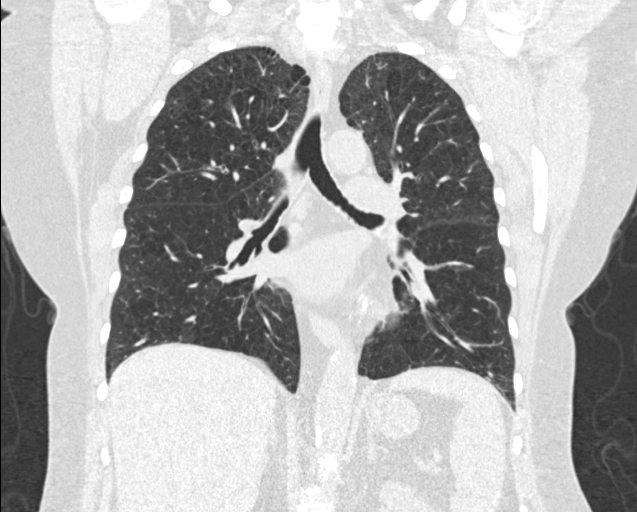

[Series 9: lung 1.00 br60 axial · axial · 0.86mm/px · z∈[-1036,-720]mm · 11 of 352 slices shown, 14 images]
[im 18/352  mediastinal]
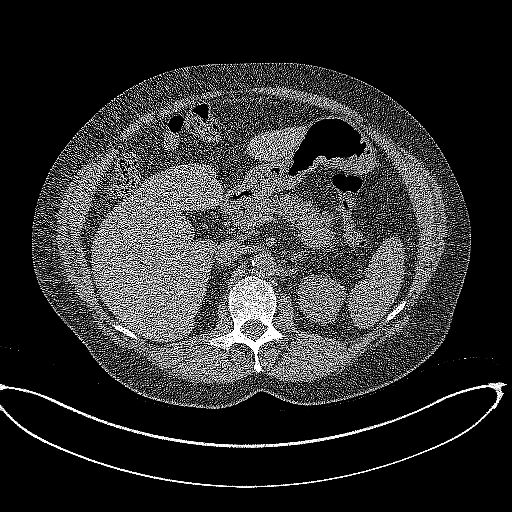
[im 18/352  lung]
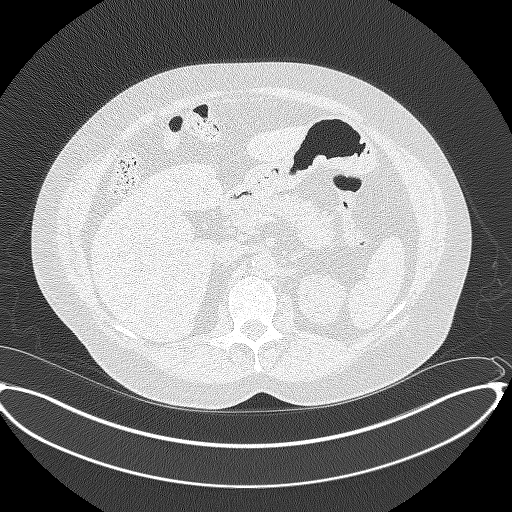
[im 53/352  lung]
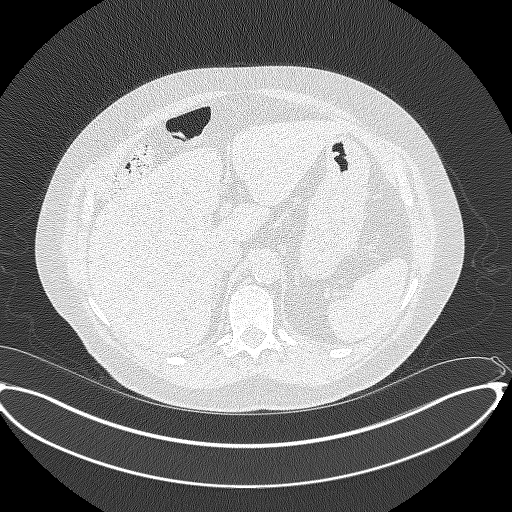
[im 88/352  lung]
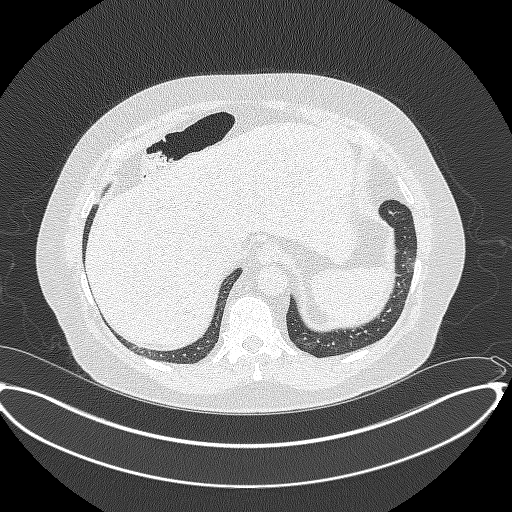
[im 123/352  lung]
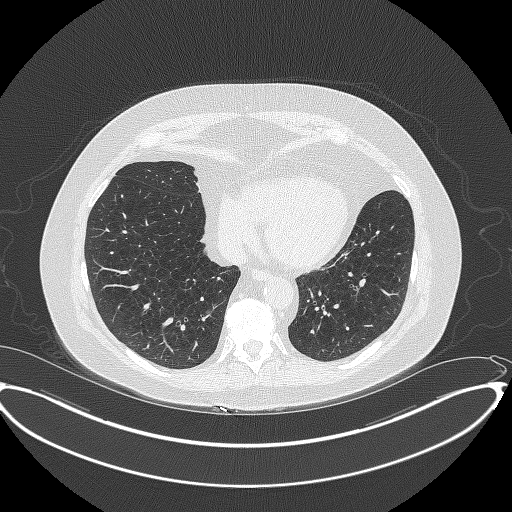
[im 141/352  mediastinal]
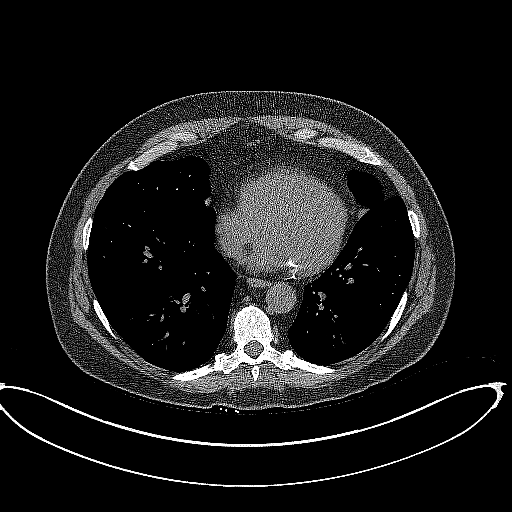
[im 141/352  lung]
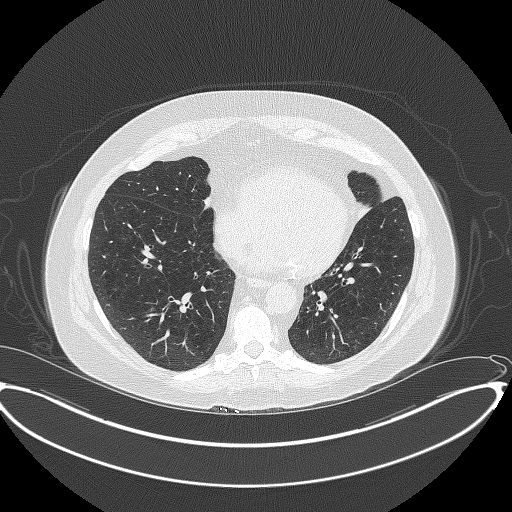
[im 176/352  lung]
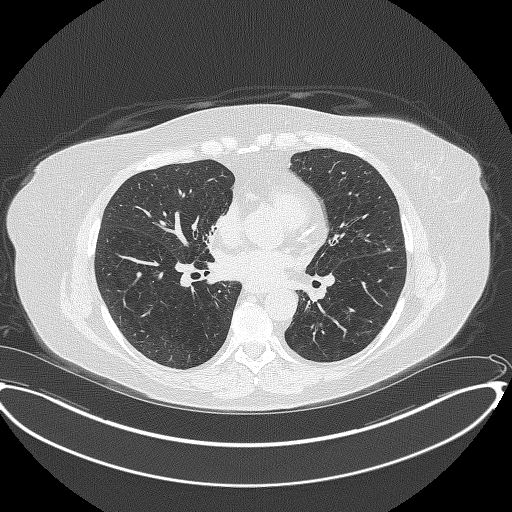
[im 211/352  lung]
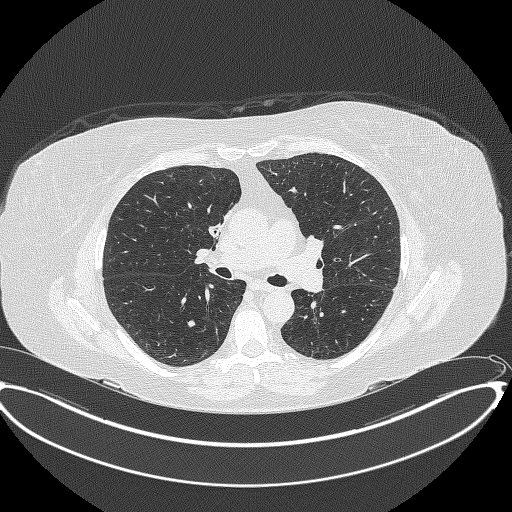
[im 229/352  lung]
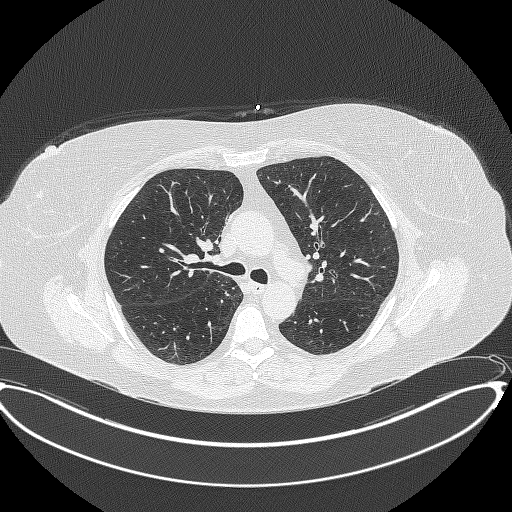
[im 264/352  mediastinal]
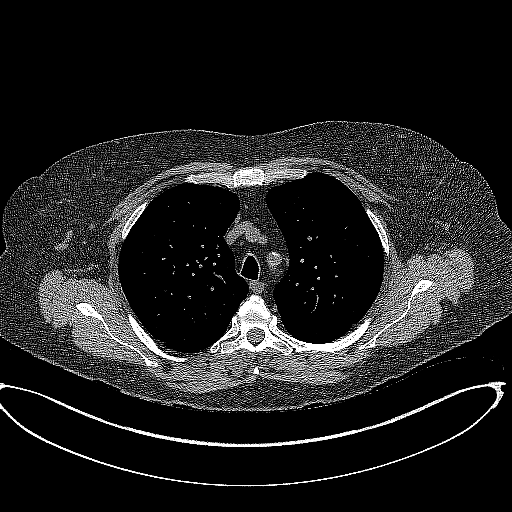
[im 264/352  lung]
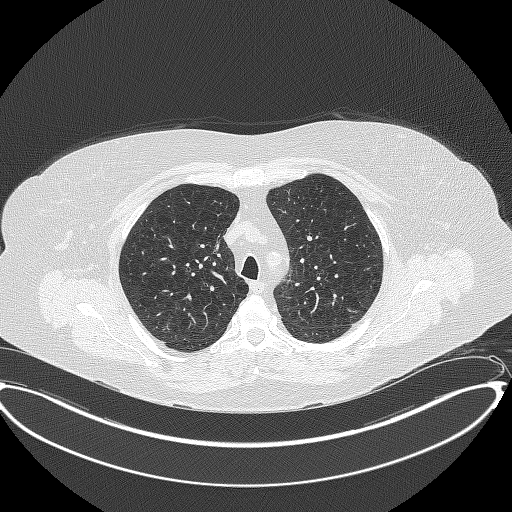
[im 299/352  lung]
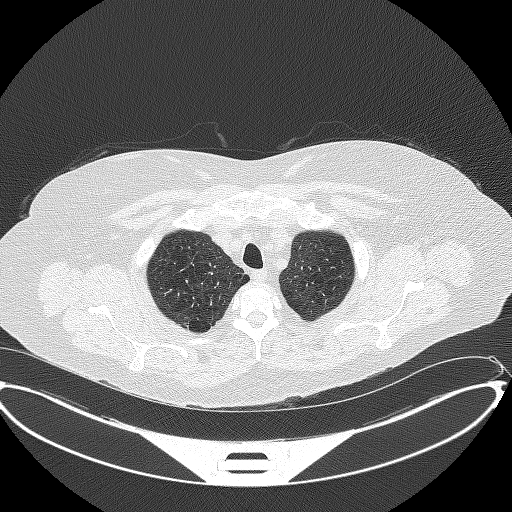
[im 334/352  lung]
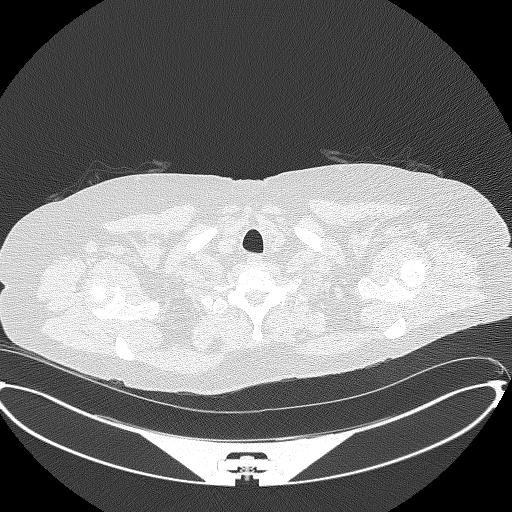

[14 of 40 positions shown; findings below may reference images not displayed]

FINDINGS: Cardiovascular: Heart size is normal. There is no significant
pericardial fluid, thickening or pericardial calcification. There is
aortic atherosclerosis, as well as atherosclerosis of the great
vessels of the mediastinum and the coronary arteries, including
calcified atherosclerotic plaque in the left main, left anterior
descending and left circumflex coronary arteries. Severe
calcifications of the mitral annulus.

Mediastinum/Nodes: No pathologically enlarged mediastinal or hilar
lymph nodes. Please note that accurate exclusion of hilar adenopathy
is limited on noncontrast CT scans. Esophagus is unremarkable in
appearance. No axillary lymphadenopathy.

Lungs/Pleura: Tiny pulmonary nodules are noted in the right lung,
largest of which is in the right upper lobe (axial image 69 of
series 3), with a volume derived mean diameter of 2.8 mm. No larger
more suspicious appearing pulmonary nodules or masses are noted. No
acute consolidative airspace disease. No pleural effusions. Diffuse
bronchial wall thickening with moderate centrilobular and paraseptal
emphysema.

Upper Abdomen: Aortic atherosclerosis. Calcified granulomas in the
spleen.

Musculoskeletal: There are no aggressive appearing lytic or blastic
lesions noted in the visualized portions of the skeleton.
IMPRESSION: 1. Lung-RADS 2S, benign appearance or behavior. Continue annual
screening with low-dose chest CT without contrast in 12 months.
2. The "S" modifier above refers to potentially clinically
significant non lung cancer related findings. Specifically, there is
aortic atherosclerosis, in addition to left main and 2 vessel
coronary artery disease. Please note that although the presence of
coronary artery calcium documents the presence of coronary artery
disease, the severity of this disease and any potential stenosis
cannot be assessed on this non-gated CT examination. Assessment for
potential risk factor modification, dietary therapy or pharmacologic
therapy may be warranted, if clinically indicated.
3. Mild diffuse bronchial wall thickening with moderate
centrilobular and paraseptal emphysema; imaging findings suggestive
of underlying COPD.
4. There are calcifications of the mitral annulus. Echocardiographic
correlation for evaluation of potential valvular dysfunction may be
warranted if clinically indicated.

Aortic Atherosclerosis (RRKKQ-[id number].Q) and Emphysema ([id number]).

## 2021-03-14 MED ORDER — METHYLPREDNISOLONE SODIUM SUCC 40 MG IJ SOLR
40.0000 mg | Freq: Two times a day (BID) | INTRAMUSCULAR | Status: AC
  Administered 2021-03-14 (×2): 40 mg via INTRAVENOUS
  Filled 2021-03-14 (×2): qty 1

## 2021-03-14 MED ORDER — CYCLOBENZAPRINE HCL 10 MG PO TABS
10.0000 mg | ORAL_TABLET | Freq: Every day | ORAL | Status: DC
  Administered 2021-03-14 – 2021-03-17 (×4): 10 mg via ORAL
  Filled 2021-03-14 (×4): qty 1

## 2021-03-14 MED ORDER — IPRATROPIUM-ALBUTEROL 0.5-2.5 (3) MG/3ML IN SOLN
3.0000 mL | Freq: Four times a day (QID) | RESPIRATORY_TRACT | Status: DC
  Administered 2021-03-14 – 2021-03-15 (×8): 3 mL via RESPIRATORY_TRACT
  Filled 2021-03-14 (×9): qty 3

## 2021-03-14 MED ORDER — ACYCLOVIR 200 MG PO CAPS
400.0000 mg | ORAL_CAPSULE | Freq: Two times a day (BID) | ORAL | Status: DC
  Administered 2021-03-14 – 2021-03-19 (×10): 400 mg via ORAL
  Filled 2021-03-14 (×12): qty 2

## 2021-03-14 MED ORDER — ACETAMINOPHEN 325 MG PO TABS: 650.0000 mg | ORAL_TABLET | Freq: Four times a day (QID) | ORAL | Status: DC | PRN

## 2021-03-14 MED ORDER — QUETIAPINE FUMARATE 25 MG PO TABS
50.0000 mg | ORAL_TABLET | Freq: Once | ORAL | Status: AC
  Administered 2021-03-14: 50 mg via ORAL
  Filled 2021-03-14: qty 2

## 2021-03-14 MED ORDER — VENLAFAXINE HCL ER 150 MG PO CP24
150.0000 mg | ORAL_CAPSULE | Freq: Every day | ORAL | Status: DC
  Administered 2021-03-14 – 2021-03-19 (×6): 150 mg via ORAL
  Filled 2021-03-14 (×7): qty 1

## 2021-03-14 MED ORDER — VARENICLINE TARTRATE 0.5 MG PO TABS
1.0000 mg | ORAL_TABLET | Freq: Two times a day (BID) | ORAL | Status: DC
  Administered 2021-03-14 – 2021-03-19 (×11): 1 mg via ORAL
  Filled 2021-03-14 (×13): qty 2

## 2021-03-14 MED ORDER — INSULIN ASPART 100 UNIT/ML IJ SOLN
0.0000 [IU] | Freq: Three times a day (TID) | INTRAMUSCULAR | Status: DC
  Administered 2021-03-14: 11 [IU] via SUBCUTANEOUS
  Administered 2021-03-14: 15 [IU] via SUBCUTANEOUS
  Administered 2021-03-14: 3 [IU] via SUBCUTANEOUS
  Administered 2021-03-15: 7 [IU] via SUBCUTANEOUS
  Administered 2021-03-15 (×2): 4 [IU] via SUBCUTANEOUS
  Administered 2021-03-16 (×2): 7 [IU] via SUBCUTANEOUS
  Administered 2021-03-16: 11 [IU] via SUBCUTANEOUS
  Administered 2021-03-17: 7 [IU] via SUBCUTANEOUS
  Administered 2021-03-17: 11 [IU] via SUBCUTANEOUS
  Administered 2021-03-17: 4 [IU] via SUBCUTANEOUS
  Administered 2021-03-18: 11 [IU] via SUBCUTANEOUS
  Administered 2021-03-18: 3 [IU] via SUBCUTANEOUS
  Administered 2021-03-18: 20 [IU] via SUBCUTANEOUS
  Administered 2021-03-19 (×3): 4 [IU] via SUBCUTANEOUS
  Filled 2021-03-14 (×18): qty 1

## 2021-03-14 MED ORDER — TRAZODONE HCL 100 MG PO TABS
100.0000 mg | ORAL_TABLET | Freq: Every day | ORAL | Status: DC
  Administered 2021-03-14 – 2021-03-17 (×4): 100 mg via ORAL
  Filled 2021-03-14 (×4): qty 1

## 2021-03-14 MED ORDER — SODIUM CHLORIDE 0.9 % IV SOLN
1.0000 g | INTRAVENOUS | Status: DC
  Administered 2021-03-14: 1 g via INTRAVENOUS
  Filled 2021-03-14: qty 10

## 2021-03-14 MED ORDER — ACETAMINOPHEN 650 MG RE SUPP: 650.0000 mg | Freq: Four times a day (QID) | RECTAL | Status: DC | PRN

## 2021-03-14 MED ORDER — TRAZODONE HCL 50 MG PO TABS
25.0000 mg | ORAL_TABLET | Freq: Every evening | ORAL | Status: DC | PRN
  Administered 2021-03-14 – 2021-03-16 (×3): 25 mg via ORAL
  Filled 2021-03-14 (×3): qty 1

## 2021-03-14 MED ORDER — GUAIFENESIN ER 600 MG PO TB12
600.0000 mg | ORAL_TABLET | Freq: Two times a day (BID) | ORAL | Status: DC
  Administered 2021-03-14 – 2021-03-15 (×3): 600 mg via ORAL
  Filled 2021-03-14 (×3): qty 1

## 2021-03-14 MED ORDER — ENOXAPARIN SODIUM 40 MG/0.4ML IJ SOSY
40.0000 mg | PREFILLED_SYRINGE | INTRAMUSCULAR | Status: DC
  Administered 2021-03-14 – 2021-03-19 (×6): 40 mg via SUBCUTANEOUS
  Filled 2021-03-14 (×6): qty 0.4

## 2021-03-14 MED ORDER — INSULIN ASPART 100 UNIT/ML IJ SOLN
0.0000 [IU] | Freq: Every day | INTRAMUSCULAR | Status: DC
  Administered 2021-03-15 – 2021-03-16 (×2): 2 [IU] via SUBCUTANEOUS
  Filled 2021-03-14 (×3): qty 1

## 2021-03-14 MED ORDER — INFLUENZA VAC SPLIT QUAD 0.5 ML IM SUSY
0.5000 mL | PREFILLED_SYRINGE | INTRAMUSCULAR | Status: AC
  Administered 2021-03-15: 0.5 mL via INTRAMUSCULAR
  Filled 2021-03-14: qty 0.5

## 2021-03-14 MED ORDER — MONTELUKAST SODIUM 10 MG PO TABS
10.0000 mg | ORAL_TABLET | Freq: Every day | ORAL | Status: DC
  Administered 2021-03-14 – 2021-03-18 (×5): 10 mg via ORAL
  Filled 2021-03-14 (×5): qty 1

## 2021-03-14 MED ORDER — NYSTATIN-TRIAMCINOLONE 100000-0.1 UNIT/GM-% EX OINT
1.0000 "application " | TOPICAL_OINTMENT | Freq: Two times a day (BID) | CUTANEOUS | Status: DC | PRN
  Filled 2021-03-14: qty 15

## 2021-03-14 MED ORDER — PREGABALIN 75 MG PO CAPS: 150.0000 mg | ORAL_CAPSULE | Freq: Every day | ORAL | Status: DC

## 2021-03-14 MED ORDER — HYDROCHLOROTHIAZIDE 25 MG PO TABS
25.0000 mg | ORAL_TABLET | Freq: Every day | ORAL | Status: DC
  Administered 2021-03-14 – 2021-03-19 (×6): 25 mg via ORAL
  Filled 2021-03-14 (×6): qty 1

## 2021-03-14 MED ORDER — POLYETHYLENE GLYCOL 3350 17 G PO PACK: 17.0000 g | PACK | Freq: Every day | ORAL | Status: DC | PRN

## 2021-03-14 MED ORDER — ATENOLOL 25 MG PO TABS
25.0000 mg | ORAL_TABLET | Freq: Every day | ORAL | Status: DC
  Administered 2021-03-14 – 2021-03-19 (×6): 25 mg via ORAL
  Filled 2021-03-14 (×6): qty 1

## 2021-03-14 MED ORDER — SODIUM CHLORIDE 0.9 % IV SOLN: INTRAVENOUS | Status: DC

## 2021-03-14 MED ORDER — ONDANSETRON HCL 4 MG PO TABS: 4.0000 mg | ORAL_TABLET | Freq: Four times a day (QID) | ORAL | Status: DC | PRN

## 2021-03-14 MED ORDER — PREGABALIN 75 MG PO CAPS
150.0000 mg | ORAL_CAPSULE | Freq: Every day | ORAL | Status: DC
  Administered 2021-03-14 – 2021-03-18 (×5): 150 mg via ORAL
  Filled 2021-03-14 (×5): qty 2

## 2021-03-14 MED ORDER — QUETIAPINE FUMARATE 25 MG PO TABS
50.0000 mg | ORAL_TABLET | Freq: Every day | ORAL | Status: DC
Start: 2021-03-14 — End: 2021-03-19
  Administered 2021-03-14 – 2021-03-18 (×5): 50 mg via ORAL
  Filled 2021-03-14 (×5): qty 2

## 2021-03-14 MED ORDER — ONDANSETRON HCL 4 MG/2ML IJ SOLN: 4.0000 mg | Freq: Four times a day (QID) | INTRAMUSCULAR | Status: DC | PRN

## 2021-03-14 MED ORDER — WITCH HAZEL-GLYCERIN EX PADS
MEDICATED_PAD | CUTANEOUS | Status: DC | PRN
  Filled 2021-03-14: qty 100

## 2021-03-14 MED ORDER — BUSPIRONE HCL 10 MG PO TABS
5.0000 mg | ORAL_TABLET | Freq: Two times a day (BID) | ORAL | Status: DC
  Administered 2021-03-14 – 2021-03-19 (×11): 5 mg via ORAL
  Filled 2021-03-14 (×11): qty 1

## 2021-03-14 MED ORDER — IPRATROPIUM-ALBUTEROL 0.5-2.5 (3) MG/3ML IN SOLN
3.0000 mL | Freq: Once | RESPIRATORY_TRACT | Status: AC
  Administered 2021-03-14: 3 mL via RESPIRATORY_TRACT
  Filled 2021-03-14: qty 3

## 2021-03-14 MED ORDER — HYDROCOD POLI-CHLORPHE POLI ER 10-8 MG/5ML PO SUER
5.0000 mL | Freq: Two times a day (BID) | ORAL | Status: DC | PRN
  Administered 2021-03-16 – 2021-03-17 (×3): 5 mL via ORAL
  Filled 2021-03-14 (×3): qty 5

## 2021-03-14 MED ORDER — OLOPATADINE HCL 0.1 % OP SOLN
1.0000 [drp] | Freq: Two times a day (BID) | OPHTHALMIC | Status: DC
  Administered 2021-03-14 – 2021-03-19 (×11): 1 [drp] via OPHTHALMIC
  Filled 2021-03-14: qty 5

## 2021-03-14 MED ORDER — ROFLUMILAST 500 MCG PO TABS
500.0000 ug | ORAL_TABLET | Freq: Every day | ORAL | Status: DC
  Administered 2021-03-14 – 2021-03-19 (×6): 500 ug via ORAL
  Filled 2021-03-14 (×6): qty 1

## 2021-03-14 MED ORDER — PREDNISONE 20 MG PO TABS
40.0000 mg | ORAL_TABLET | Freq: Every day | ORAL | Status: AC
  Administered 2021-03-15 – 2021-03-18 (×4): 40 mg via ORAL
  Filled 2021-03-14 (×4): qty 2

## 2021-03-14 MED ORDER — NYSTATIN 100000 UNIT/ML MT SUSP
5.0000 mL | Freq: Four times a day (QID) | OROMUCOSAL | Status: DC
  Administered 2021-03-14 – 2021-03-19 (×18): 500000 [IU] via ORAL
  Filled 2021-03-14 (×19): qty 5

## 2021-03-14 MED ORDER — POTASSIUM CHLORIDE CRYS ER 20 MEQ PO TBCR
40.0000 meq | EXTENDED_RELEASE_TABLET | Freq: Once | ORAL | Status: AC
  Administered 2021-03-14: 40 meq via ORAL
  Filled 2021-03-14: qty 2

## 2021-03-14 MED ORDER — LEVOTHYROXINE SODIUM 25 MCG PO TABS
25.0000 ug | ORAL_TABLET | Freq: Every day | ORAL | Status: DC
  Administered 2021-03-14 – 2021-03-19 (×5): 25 ug via ORAL
  Filled 2021-03-14 (×5): qty 1

## 2021-03-14 MED ORDER — PANTOPRAZOLE SODIUM 40 MG PO TBEC
40.0000 mg | DELAYED_RELEASE_TABLET | Freq: Every day | ORAL | Status: DC
  Administered 2021-03-14 – 2021-03-19 (×6): 40 mg via ORAL
  Filled 2021-03-14 (×6): qty 1

## 2021-03-14 MED ORDER — ATORVASTATIN CALCIUM 20 MG PO TABS
20.0000 mg | ORAL_TABLET | Freq: Every day | ORAL | Status: DC
  Administered 2021-03-14 – 2021-03-19 (×6): 20 mg via ORAL
  Filled 2021-03-14 (×6): qty 1

## 2021-03-14 MED ORDER — BUPROPION HCL ER (XL) 150 MG PO TB24
150.0000 mg | ORAL_TABLET | Freq: Every day | ORAL | Status: DC
  Administered 2021-03-14 – 2021-03-19 (×6): 150 mg via ORAL
  Filled 2021-03-14 (×6): qty 1

## 2021-03-14 NOTE — Plan of Care (Signed)

## 2021-03-14 NOTE — H&P (Addendum)
[place]   PATIENT NAME: Joan [name]    MR#:  [id number]  DATE OF BIRTH:  [birth year]  DATE OF ADMISSION:  03/13/2021  PRIMARY CARE PHYSICIAN: [name], MD   Patient is coming from: Home  REQUESTING/REFERRING PHYSICIAN: Lurline Hare, MD  CHIEF COMPLAINT:   Chief Complaint  Patient presents with   Shortness of Breath    HISTORY OF PRESENT ILLNESS:  Joan [name] is a 61 y.o. Caucasian female with medical history significant for anxiety, bipolar disorder, asthma, COPD, depression, type 2 diabetes mellitus with diabetic neuropathy, hypothyroidism and GERD, presented to the ER with acute onset of worsening dyspnea with associated productive cough of yellowish, occasionally brownish and clear sputum and wheezing which have been going on over the last couple of weeks and got significantly worse for the last couple of days.  She is on home O2 at 3 L/min at baseline.  She has been using her nebulizer at home.  No fever or chills.  No chest pain or palpitations.  No hemoptysis.  No nausea or vomiting or abdominal pain.  No dysuria, oliguria or hematuria or flank pain.  She has been vaccinated for COVID-19 but never had booster.  She continues to smoke half a pack of cigarettes per day.  ED Course: When she came to the ER vital signs were within normal and pulse currently was 96% on 3 L of O2 by nasal cannula.  Labs revealed hypokalemia of 2.9 hypochloremia 96 with otherwise unremarkable CMP.  High-sensitivity troponin I was 502.  CBC showed mild leukocytosis of 11.4.  Influenza antigens and COVID-19 PCR came back negative.  Imaging: Portable chest ray showed mild COPD without acute abnormality.  The patient was given 125 mg IV Solu-Medrol in 3 duo nebs.  She was still having wheezing and tachypnea.  She will be admitted to a medical telemetry bed for further evaluation and management. PAST MEDICAL HISTORY:   Past Medical History:  Diagnosis Date   Acute respiratory  failure with hypoxia ([place])    Alcoholism and drug addiction in family    Anxiety    Appendicitis    Arthritis    "knees, hips, lower back" (09/25/2016)   Asthma    Bipolar disorder ([place])    Chronic bronchitis ([place])    "all the time" (09/25/2016)   Chronic leg pain    RLE   Chronic lower back pain    COPD (chronic obstructive pulmonary disease) ([place])    Depression    Diabetes mellitus without complication (HCC)    Diabetic neuropathy ([place])    Early cataracts, bilateral    Emphysema of lung ([place])    Generalized anxiety disorder 02/09/2020   GERD (gastroesophageal reflux disease)    Headache    Hepatitis C    "treated back in 2001-2002"   High cholesterol    Hypertension    Hypothyroidism    Incarcerated ventral hernia 04/04/2017   Mood disorder in conditions classified elsewhere 02/09/2020   On home oxygen therapy    "2L; at nighttime" (04/04/2017)   OSA on CPAP    CPAP with Oxygen 2 liters   Paranoid ([place])    Pneumonia    "all the time" (09/25/2016)   Positive PPD    with negative chest x ray   PTSD (post-traumatic stress disorder) 02/09/2020   Tachycardia    patient reports with exertion ; denies any other conditions    Thyroid disease    Wears glasses  Wears partial dentures     PAST SURGICAL HISTORY:   Past Surgical History:  Procedure Laterality Date   BACK SURGERY     BIOPSY  11/24/2018   Procedure: BIOPSY;  Surgeon: Thornton Park, MD;  [facility];  Service: Gastroenterology;;   CARDIAC CATHETERIZATION     COLONOSCOPY     COLONOSCOPY WITH PROPOFOL N/A 11/24/2018   Procedure: COLONOSCOPY WITH PROPOFOL;  Surgeon: Thornton Park, MD;  [facility];  Service: Gastroenterology;  Laterality: N/A;   CYST EXCISION     removal of cyst in sinuses   DILATION AND CURETTAGE OF UTERUS     ESOPHAGOGASTRODUODENOSCOPY (EGD) WITH PROPOFOL N/A 11/24/2018   Procedure: ESOPHAGOGASTRODUODENOSCOPY (EGD) WITH PROPOFOL;  Surgeon: Thornton Park, MD;   [facility];  Service: Gastroenterology;  Laterality: N/A;   FOOT SURGERY Bilateral    bone removed from 4th and 5 th toes and put in pin then took pin out   [place]  ~ 2014/2015   "removed mesh & infection"   [place] N/A 04/04/2017   Procedure: LAPAROSCOPIC INCISIONAL HERNIA REPAIR WITH MESH;  Surgeon: Fanny Skates, MD;  Location: [place];  Service: General;  Laterality: N/A;   LACERATION REPAIR Right    repair wrist artery from laceration from ice skate   LAPAROSCOPIC APPENDECTOMY N/A 03/31/2019   Procedure: APPENDECTOMY LAPAROSCOPIC;  Surgeon: Ralene Ok, MD;  [facility];  Service: General;  Laterality: N/A;   LAPAROSCOPIC APPENDECTOMY N/A 07/09/2019   Procedure: APPENDECTOMY LAPAROSCOPIC;  Surgeon: Ralene Ok, MD;  Location: [place];  Service: General;  Laterality: N/A;   LAPAROSCOPIC INCISIONAL / UMBILICAL / [place]  04/04/2017   LAPAROSCOPIC INCISIONAL HERNIA REPAIR WITH MESH   LIPOMA EXCISION Right    fattty lipoma in neck   NASAL SEPTUM SURGERY     POSTERIOR LUMBAR FUSION  2015/2016 X 2   "added rods and screws"   SINOSCOPY     TOTAL HIP ARTHROPLASTY Right 02/05/2017   Procedure: RIGHT TOTAL HIP ARTHROPLASTY;  Surgeon: [name], MD;  [facility];  Service: Orthopedics;  Laterality: Right;   TOTAL HIP ARTHROPLASTY Left 06/18/2017   Procedure: LEFT TOTAL HIP ARTHROPLASTY;  Surgeon: [name], MD;  [facility];  Service: Orthopedics;  Laterality: Left;   TOTAL HIP REVISION Left 01/26/2019   Procedure: TOTAL HIP REVISION;  Surgeon: Paralee Cancel, MD;  [facility];  Service: Orthopedics;  Laterality: Left;  2 hrs   TUBAL LIGATION     UMBILICAL HERNIA REPAIR  ~ 2013   w/mesh   UPPER GI ENDOSCOPY      SOCIAL HISTORY:   Social History   Tobacco Use   Smoking status: Every Day    Packs/day: 0.50    Years: 44.00    Pack years: 22.00    Types: Cigarettes   Smokeless  tobacco: Never   Tobacco comments:    down to 0.5ppd 06/30/20/jm  Substance Use Topics   Alcohol use: Not Currently    FAMILY HISTORY:   Family History  Problem Relation Age of Onset   Diabetes Mother    Hypertension Mother    Hypertension Father    Cancer Father        lung    DRUG ALLERGIES:   Allergies  Allergen Reactions   Keflex [Cephalexin] Rash and Other (See Comments)    Has tolerated amoxicillin since had keflex   Prednisone Other (See Comments)    "  counter reacts"   Shellfish Allergy Shortness Of Breath, Nausea And Vomiting and Other (See Comments)    Stomach cramps   Tetracyclines & Related Anaphylaxis, Swelling and Other (See Comments)    Throat swelling requiring hospitalization   Dilaudid [Hydromorphone Hcl] Other (See Comments)    "Lethargy"   Incruse Ellipta [Umeclidinium Bromide] Nausea Only   Other    Tuberculin Tests Other (See Comments)    False postive    REVIEW OF SYSTEMS:   ROS As per history of present illness. All pertinent systems were reviewed above. Constitutional, HEENT, cardiovascular, respiratory, GI, GU, musculoskeletal, neuro, psychiatric, endocrine, integumentary and hematologic systems were reviewed and are otherwise negative/unremarkable except for positive findings mentioned above in the HPI.   MEDICATIONS AT HOME:   Prior to Admission medications   Medication Sig Start Date End Date Taking? Authorizing Provider  acyclovir (ZOVIRAX) 400 MG tablet TAKE 1 TABLET BY MOUTH  TWICE DAILY 11/10/20   [name], MD  albuterol (PROVENTIL) (2.5 MG/3ML) 0.083% nebulizer solution Take 3 mLs (2.5 mg total) by nebulization 2 (two) times daily. And every 6 hours as needed.  DX: J44.9 01/05/21   [name], NP  atenolol (TENORMIN) 25 MG tablet Take 25 mg by mouth daily. 09/10/20   [provider]  atorvastatin (LIPITOR) 20 MG tablet TAKE 1 TABLET BY MOUTH  DAILY 01/17/21   [name], MD  bisoprolol (ZEBETA) 5 MG  tablet Take 1 tablet (5 mg total) by mouth daily. Patient not taking: Reported on 02/07/2021 08/19/20   Samuella Cota, MD  Budeson-Glycopyrrol-Formoterol (BREZTRI AEROSPHERE) 160-9-4.8 MCG/ACT AERO Inhale 2 puffs into the lungs in the morning and at bedtime. 01/05/21   [name], NP  Budeson-Glycopyrrol-Formoterol (BREZTRI AEROSPHERE) 160-9-4.8 MCG/ACT AERO Inhale 2 puffs into the lungs in the morning and at bedtime. 02/07/21   [name], NP  Budeson-Glycopyrrol-Formoterol (BREZTRI AEROSPHERE) 160-9-4.8 MCG/ACT AERO Inhale 2 puffs into the lungs in the morning and at bedtime. 02/13/21   [name], MD  buPROPion (WELLBUTRIN XL) 150 MG 24 hr tablet TAKE 1 TABLET BY MOUTH  DAILY 01/17/21   [name], MD  busPIRone (BUSPAR) 5 MG tablet TAKE 1/2 TO 1 TABLET BY MOUTH  TWICE DAILY AS NEEDED 01/17/21   [name], MD  Camphor-Menthol-Methyl Sal 3.03-02-08 % PTCH Apply 1 patch topically daily as needed (pain).    [provider]  cyclobenzaprine (FLEXERIL) 10 MG tablet Take 10 mg by mouth at bedtime. 11/09/20   [provider]  hydrochlorothiazide (HYDRODIURIL) 25 MG tablet TAKE 1 TABLET BY MOUTH  DAILY 01/17/21   [name], MD  Ipratropium-Albuterol (COMBIVENT RESPIMAT) 20-100 MCG/ACT AERS respimat Inhale 1 puff into the lungs every 6 (six) hours as needed for wheezing or shortness of breath. 03/02/21   [name], MD  levothyroxine (SYNTHROID) 25 MCG tablet TAKE 1 TABLET BY MOUTH  DAILY BEFORE BREAKFAST 01/17/21   [name], MD  [facility] ARTHRITIS PAIN RELIEF EX) Apply 1 patch topically daily as needed (to painful sites- remove as directed).     [provider]  methylPREDNISolone (MEDROL DOSEPAK) 4 MG TBPK tablet As directed 02/16/21   Martyn Ehrich, NP  montelukast (SINGULAIR) 10 MG tablet TAKE 1 TABLET BY MOUTH AT  BEDTIME 01/17/21   [name], MD  mupirocin cream (BACTROBAN) 2 % Apply topically  daily. Patient not taking: Reported on 02/07/2021 08/19/20   Samuella Cota, MD  nystatin (MYCOSTATIN)  100000 UNIT/ML suspension Take 5 mLs (500,000 Units total) by mouth 4 (four) times daily. 02/16/21   Martyn Ehrich, NP  nystatin-triamcinolone ointment [facility]) Apply 1 application topically 2 (two) times daily as needed (applied to skin folds/groins/under breast skin irritation rash.). 09/08/17   [name], MD  OXYGEN Inhale 2-3 L/min into the lungs See admin instructions. Inhale  2-3 L/min of oxygen into the lungs w/CPAP at bedtime and as needed for shortness of breath with "strenuous activity" during the day    [provider]  pantoprazole (PROTONIX) 40 MG tablet TAKE 1 TABLET BY MOUTH  DAILY 01/17/21   [name], MD  PATADAY 0.2 % SOLN Apply 1 drop to eye daily. 08/20/19   [provider]  polyethylene glycol (MIRALAX / GLYCOLAX) 17 g packet Take 17 g by mouth daily as needed for mild constipation. 04/14/19   Meuth, Brooke A, PA-C  pregabalin (LYRICA) 150 MG capsule Take 1 capsule (150 mg total) by mouth at bedtime. 03/06/21   [name], MD  QUEtiapine (SEROQUEL) 50 MG tablet TAKE 1 TABLET BY MOUTH AT  BEDTIME 01/17/21   [name], MD  roflumilast (DALIRESP) 500 MCG TABS tablet Take 1 tablet (500 mcg total) by mouth daily. 11/06/18   [name], NP  traZODone (DESYREL) 100 MG tablet TAKE 1 TABLET BY MOUTH AT  BEDTIME 01/17/21   [name], MD  varenicline (CHANTIX PAK) 0.5 MG X 11 & 1 MG X 42 tablet Take one 0.5 mg tablet by mouth once daily for 3 days, then increase to one 0.5 mg tablet twice daily for 4 days, then increase to one 1 mg tablet twice daily. 02/07/21   [name], NP  venlafaxine XR (EFFEXOR-XR) 150 MG 24 hr capsule TAKE 1 CAPSULE BY MOUTH  DAILY WITH BREAKFAST 01/17/21   [name], MD  witch hazel-glycerin (TUCKS) pad Apply topically as needed for itching. 08/18/20   Samuella Cota, MD       VITAL SIGNS:  Blood pressure 140/81, pulse 80, temperature 97.9 F (36.6 C), temperature source Oral, resp. rate 20, height 5\' 8"  (1.727 m), weight 90.3 kg, SpO2 96 %.  PHYSICAL EXAMINATION:  Physical Exam  GENERAL:  61 y.o.-year-old Caucasian female patient lying in the bed with mild respiratory distress with conversational dyspnea. EYES: Pupils equal, round, reactive to light and accommodation. No scleral icterus. Extraocular muscles intact.  HEENT: Head atraumatic, normocephalic. Oropharynx and nasopharynx clear.  NECK:  Supple, no jugular venous distention. No thyroid enlargement, no tenderness.  LUNGS: Diffuse expiratory wheezes with tight expiratory airflow and harsh vesicular breathing. CARDIOVASCULAR: Regular rate and rhythm, S1, S2 normal. No murmurs, rubs, or gallops.  ABDOMEN: Soft, nondistended, nontender. Bowel sounds present. No organomegaly or mass.  EXTREMITIES: No pedal edema, cyanosis, or clubbing.  NEUROLOGIC: Cranial nerves II through XII are intact. Muscle strength 5/5 in all extremities. Sensation intact. Gait not checked.  PSYCHIATRIC: The patient is alert and oriented x 3.  Normal affect and good eye contact. SKIN: No obvious rash, lesion, or ulcer.   LABORATORY PANEL:   CBC Recent Labs  Lab 03/13/21 2251  WBC 11.4*  HGB 13.4  HCT 39.7  PLT 392   ------------------------------------------------------------------------------------------------------------------  Chemistries  Recent Labs  Lab 03/13/21 2344  NA 137  K 2.9*  CL 96*  CO2 31  GLUCOSE 118*  BUN 11  CREATININE 1.09*  CALCIUM 9.3  AST 25  ALT 17  ALKPHOS 106  BILITOT 0.8   ------------------------------------------------------------------------------------------------------------------  Cardiac Enzymes No results for input(s): TROPONINI in the last 168  hours. ------------------------------------------------------------------------------------------------------------------  RADIOLOGY:  DG Chest Port 1 View  Result Date: 03/13/2021 CLINICAL DATA:  Shortness of breath for 2 weeks, initial encounter EXAM: PORTABLE CHEST 1 VIEW COMPARISON:  08/10/2020 FINDINGS: Cardiac shadow is within normal limits. Aortic calcifications are noted. Lungs are mildly hyperinflated. No bony abnormality is seen. IMPRESSION: Mild COPD without acute abnormality. Electronically Signed   By: Inez Catalina M.D.   On: 03/13/2021 23:32      IMPRESSION AND PLAN:  Active Problems:   COPD exacerbation ([place])  1.  COPD and asthma acute exacerbation likely secondary to acute bronchitis with chronic respiratory failure on home O2.. - The patient will be admitted to a medical telemetry bed. - We will continue bronchodilator therapy with DuoNeb scheduled every 4 hours as needed. - We will continue steroid therapy with IV Solu-Medrol. - We will add IV Protonix given productive cough and severity of COPD exacerbation. - We will place her on mucolytics. - O2 protocol will be followed. - We will continue Singulair  2.  Hypokalemia. - We will replace potassium and check magnesium level.  3.  Hypothyroidism. - We will continue Synthroid.  4.  Type 2 diabetes mellitus with peripheral neuropathy. - We will continue his Lyrica and place him on supplement coverage with NovoLog.  5.  Essential hypertension. - We will continue beta-blocker therapy, as well as HCTZ.  6.  Dyslipidemia. - We will continue statin therapy.  7.  Ongoing tobacco abuse. - She was counseled for smoking cessation and will receive further counseling here.   DVT prophylaxis: Lovenox. Code Status: full code. Family Communication:  The plan of care was discussed in details with the patient (and family). I answered all questions. The patient agreed to proceed with the above mentioned plan. Further  management will depend upon hospital course. Disposition Plan: Back to previous home environment Consults called: none.  All the records are reviewed and case discussed with ED provider.  Status is: Inpatient   At the time of the admission, it appears that the appropriate admission status for this patient is inpatient.  This is judged to be reasonable and necessary in order to provide the required intensity of service to ensure the patient's safety given the presenting symptoms, physical exam findings and initial radiographic and laboratory data in the context of comorbid conditions.  The patient requires inpatient status due to high intensity of service, high risk of further deterioration and high frequency of surveillance required.  I certify that at the time of admission, it is my clinical judgment that the patient will require inpatient hospital care extending more than 2 midnights.                            Dispo: The patient is from: Home              Anticipated d/c is to: Home              Patient currently is not medically stable to d/c.              Difficult to place patient: No   Christel Mormon M.D on 03/14/2021 at 3:48 AM  [facility]   From 7 PM-7 AM, contact night-coverage [email or web address]  CC: Primary care physician; [name], MD

## 2021-03-14 NOTE — TOC Initial Note (Signed)
Transition of Care [facility]) - Initial/Assessment Note    Patient Details  Name: Joan [name] MRN: [id number] Date of Birth: 15-Sep-1960  Transition of Care [facility]) CM/SW Contact:    Conception Oms, RN Phone Number: 03/14/2021, 10:52 AM  Clinical Narrative:                 Transition of Care [facility]) Screening Note   Patient Details  Name: Joan [name] Date of Birth: 08-16-1960   Transition of Care [facility]) CM/SW Contact:    Conception Oms, RN Phone Number: 03/14/2021, 10:52 AM    Transition of Care Department [facility]) has reviewed patient and no TOC needs have been identified at this time. We will continue to monitor patient advancement through interdisciplinary progression rounds. If new patient transition needs arise, please place a TOC consult.           Patient Goals and CMS Choice        Expected Discharge Plan and Services                                                Prior Living Arrangements/Services                       Activities of Daily Living Home Assistive Devices/Equipment: Gilford Rile (specify type) ADL Screening (condition at time of admission) Patient's cognitive ability adequate to safely complete daily activities?: Yes Is the patient deaf or have difficulty hearing?: No Does the patient have difficulty seeing, even when wearing glasses/contacts?: Yes Does the patient have difficulty concentrating, remembering, or making decisions?: No Patient able to express need for assistance with ADLs?: Yes Does the patient have difficulty dressing or bathing?: No Independently performs ADLs?: Yes (appropriate for developmental age) Does the patient have difficulty walking or climbing stairs?: Yes Weakness of Legs: Both Weakness of Arms/Hands: Both  Permission Sought/Granted                  Emotional Assessment              Admission diagnosis:  Shortness of breath [R06.02] Hypokalemia [E87.6] COPD exacerbation  ([place]) [J44.1] Patient Active Problem List   Diagnosis Date Noted   COPD exacerbation ([place]) 03/14/2021   Depression 08/16/2020   PTSD (post-traumatic stress disorder) 02/09/2020   Mood disorder in conditions classified elsewhere 02/09/2020   Generalized anxiety disorder 02/09/2020   Type II diabetes mellitus with manifestations ([place]) 10/29/2019   Coarse tremors 10/28/2019   Chronic rhinitis 10/21/2019   Allergic rhinitis 06/16/2019   Cervical spondylosis without myelopathy 12/18/2018   Hypertension    Encounter for general adult medical examination with abnormal findings 11/06/2018   Smokers' cough ([place]) 01/15/2018   Hypomagnesemia 12/29/2017   Pulmonary nodule 11/11/2017   Carotid artery disease ([place]) 11/05/2017   OSA on CPAP 08/07/2017   Gastroesophageal reflux disease 06/23/2017   Hypothyroidism due to acquired atrophy of thyroid 04/05/2017   Primary osteoarthritis involving multiple joints 04/05/2017   Chronic constipation 02/10/2017   Iron deficiency anemia due to dietary causes 02/10/2017   Osteoarthritis 02/10/2017   Hyperlipidemia associated with type 2 diabetes mellitus ([place]) 02/10/2017   Chronic respiratory failure with hypoxia ([place]) 12/16/2016   Essential hypertension 11/29/2016   Vocal cord dysfunction    COPD, severe ([place]) 09/24/2016   Anxiety 09/24/2016   PCP:  Sharlet Salina,  Real Cons, MD Pharmacy:   [id number] [place], [place] [address] Phone: [phone] Fax: [phone]  [facility] - [place] [place] [address] Phone: [phone] Fax: [phone]     Social Determinants of Health (SDOH) Interventions    Readmission Risk Interventions Readmission Risk Prevention Plan 04/07/2019  Transportation Screening Complete  PCP or Specialist Appt within 3-5 Days Complete  HRI or [place] Complete   Social Work Consult for [place] Planning/Counseling Complete  Palliative Care Screening Not Applicable  Medication Review [employer]) Complete  Some recent data might be hidden

## 2021-03-14 NOTE — Progress Notes (Signed)
[place] Follow up visit  Patient Identification: Joan [name] MRN:  [id number] Date of Evaluation:  03/14/2021 Referral Source:Therapist [name] Chief Complaint:    depression follow up  Visit Diagnosis:    ICD-10-CM   1. Mood disorder in conditions classified elsewhere  F06.30     2. Generalized anxiety disorder  F41.1     3. PTSD (post-traumatic stress disorder)  F43.10      Virtual Visit via Telephone Note  I connected with [name] on 03/14/21 at 1:15 PM  by telephone and verified that I am speaking with the correct person using two identifiers.  Location: Patient: hospital for COPD Provider: home office   I discussed the limitations, risks, security and privacy concerns of performing an evaluation and management service by telephone and the availability of in person appointments. I also discussed with the patient that there may be a patient responsible charge related to this service. The patient expressed understanding and agreed to proceed.      I discussed the assessment and treatment plan with the patient. The patient was provided an opportunity to ask questions and all were answered. The patient agreed with the plan and demonstrated an understanding of the instructions.   The patient was advised to call back or seek an in-person evaluation if the symptoms worsen or if the condition fails to improve as anticipated.  I provided 15  minutes of non-face-to-face time during this encounter.      History of Present Illness: Patient is a 61  years old currently widow Caucasian female initially referred by her therapist for management of mood symptoms, depression possible PTSD and anxiety  Patient was in rehab after being admitted for COPD exacerbation  She got admitted in [place] again for copd, getting better Plans to live with a friend and not assisted living Mood wise doing fair and has support Therapy helping Trying to work on her smoking and  copd   Aggravating factor; difficult childhood, grief, difficult relationship with middle kid, hip replacement medical comorbidity Modifying factor:  siblings  Duration adult life      Past Psychiatric History: depression, drug use  Previous Psychotropic Medications: Yes   Substance Abuse History in the last 12 months:  No.  Consequences of Substance Abuse: history of extensive drug use uptil 8 years ago and have been in rehab programs  Past Medical History:  Past Medical History:  Diagnosis Date   Acute respiratory failure with hypoxia ([place])    Alcoholism and drug addiction in family    Anxiety    Appendicitis    Arthritis    "knees, hips, lower back" (09/25/2016)   Asthma    Bipolar disorder ([place])    Chronic bronchitis ([place])    "all the time" (09/25/2016)   Chronic leg pain    RLE   Chronic lower back pain    COPD (chronic obstructive pulmonary disease) ([place])    Depression    Diabetes mellitus without complication ([place])    Diabetic neuropathy ([place])    Early cataracts, bilateral    Emphysema of lung ([place])    Generalized anxiety disorder 02/09/2020   GERD (gastroesophageal reflux disease)    Headache    Hepatitis C    "treated back in 2001-2002"   High cholesterol    Hypertension    Hypothyroidism    Incarcerated ventral hernia 04/04/2017   Mood disorder in conditions classified elsewhere 02/09/2020   On home oxygen therapy    "2L; at nighttime" (  04/04/2017)   OSA on CPAP    CPAP with Oxygen 2 liters   Paranoid (HCC)    Pneumonia    "all the time" (09/25/2016)   Positive PPD    with negative chest x ray   PTSD (post-traumatic stress disorder) 02/09/2020   Tachycardia    patient reports with exertion ; denies any other conditions    Thyroid disease    Wears glasses    Wears partial dentures     Past Surgical History:  Procedure Laterality Date   BACK SURGERY     BIOPSY  11/24/2018   Procedure: BIOPSY;  Surgeon: Thornton Park, MD;  [facility]: [facility]  ENDOSCOPY;  Service: Gastroenterology;;   CARDIAC CATHETERIZATION     COLONOSCOPY     COLONOSCOPY WITH PROPOFOL N/A 11/24/2018   Procedure: COLONOSCOPY WITH PROPOFOL;  Surgeon: Thornton Park, MD;  [facility];  Service: Gastroenterology;  Laterality: N/A;   CYST EXCISION     removal of cyst in sinuses   DILATION AND CURETTAGE OF UTERUS     ESOPHAGOGASTRODUODENOSCOPY (EGD) WITH PROPOFOL N/A 11/24/2018   Procedure: ESOPHAGOGASTRODUODENOSCOPY (EGD) WITH PROPOFOL;  Surgeon: Thornton Park, MD;  [facility];  Service: Gastroenterology;  Laterality: N/A;   FOOT SURGERY Bilateral    bone removed from 4th and 5 th toes and put in pin then took pin out   [place]  ~ 2014/2015   "removed mesh & infection"   [place] N/A 04/04/2017   Procedure: LAPAROSCOPIC INCISIONAL HERNIA REPAIR WITH MESH;  Surgeon: Fanny Skates, MD;  Location: [facility];  Service: General;  Laterality: N/A;   LACERATION REPAIR Right    repair wrist artery from laceration from ice skate   LAPAROSCOPIC APPENDECTOMY N/A 03/31/2019   Procedure: APPENDECTOMY LAPAROSCOPIC;  Surgeon: Ralene Ok, MD;  [facility];  Service: General;  Laterality: N/A;   LAPAROSCOPIC APPENDECTOMY N/A 07/09/2019   Procedure: APPENDECTOMY LAPAROSCOPIC;  Surgeon: Ralene Ok, MD;  Location: [facility];  Service: General;  Laterality: N/A;   LAPAROSCOPIC INCISIONAL / UMBILICAL / [place]  04/04/2017   LAPAROSCOPIC INCISIONAL HERNIA REPAIR WITH MESH   LIPOMA EXCISION Right    fattty lipoma in neck   NASAL SEPTUM SURGERY     POSTERIOR LUMBAR FUSION  2015/2016 X 2   "added rods and screws"   SINOSCOPY     TOTAL HIP ARTHROPLASTY Right 02/05/2017   Procedure: RIGHT TOTAL HIP ARTHROPLASTY;  Surgeon: [name], MD;  [facility];  Service: Orthopedics;  Laterality: Right;   TOTAL HIP ARTHROPLASTY Left 06/18/2017   Procedure: LEFT TOTAL HIP ARTHROPLASTY;   Surgeon: [name], MD;  [facility];  Service: Orthopedics;  Laterality: Left;   TOTAL HIP REVISION Left 01/26/2019   Procedure: TOTAL HIP REVISION;  Surgeon: Paralee Cancel, MD;  [facility];  Service: Orthopedics;  Laterality: Left;  2 hrs   TUBAL LIGATION     UMBILICAL HERNIA REPAIR  ~ 2013   w/mesh   UPPER GI ENDOSCOPY      Family Psychiatric History: FAther : alcohol use  Family History:  Family History  Problem Relation Age of Onset   Diabetes Mother    Hypertension Mother    Hypertension Father    Cancer Father        lung    Social History:   Social History   Socioeconomic History   Marital status: Widowed    Spouse name: Not on file  Number of children: 3   Years of education: Not on file   Highest education level: Not on file  Occupational History   Not on file  Tobacco Use   Smoking status: Every Day    Packs/day: 0.50    Years: 44.00    Pack years: 22.00    Types: Cigarettes   Smokeless tobacco: Never   Tobacco comments:    down to 0.5ppd 06/30/20/jm  Vaping Use   Vaping Use: Never used  Substance and Sexual Activity   Alcohol use: Not Currently   Drug use: Not Currently    Types: "Crack" cocaine    Comment:  "nothing since 01/12/2012"   Sexual activity: Not Currently    Partners: Male  Other Topics Concern   Not on file  Social History Narrative   Not on file   Social Determinants of Health   Financial Resource Strain: High Risk   Difficulty of Paying Living Expenses: Hard  Food Insecurity: No Food Insecurity   Worried About Running Out of Food in the Last Year: Never true   Ran Out of Food in the Last Year: Never true  Transportation Needs: No Transportation Needs   Lack of Transportation (Medical): No   Lack of Transportation (Non-Medical): No  Physical Activity: Not on file  Stress: Stress Concern Present   Feeling of Stress : Very much  Social Connections: Not on file      Allergies:   Allergies  Allergen  Reactions   Keflex [Cephalexin] Rash and Other (See Comments)    Has tolerated amoxicillin since had keflex   Prednisone Other (See Comments)    "counter reacts"   Shellfish Allergy Shortness Of Breath, Nausea And Vomiting and Other (See Comments)    Stomach cramps   Tetracyclines & Related Anaphylaxis, Swelling and Other (See Comments)    Throat swelling requiring hospitalization   Dilaudid [Hydromorphone Hcl] Other (See Comments)    "Lethargy"   Incruse Ellipta [Umeclidinium Bromide] Nausea Only   Other    Tuberculin Tests Other (See Comments)    False postive    Metabolic Disorder Labs: Lab Results  Component Value Date   HGBA1C 6.8 (H) 03/14/2021   MPG 148.46 03/14/2021   MPG 148 08/09/2020   No results found for: PROLACTIN Lab Results  Component Value Date   CHOL 141 03/07/2020   TRIG 143.0 03/07/2020   HDL 54.30 03/07/2020   CHOLHDL 3 03/07/2020   VLDL 28.6 03/07/2020   LDLCALC 58 03/07/2020   LDLCALC 62 08/19/2017   Lab Results  Component Value Date   TSH 0.89 10/28/2019    Therapeutic Level Labs: No results found for: LITHIUM No results found for: CBMZ No results found for: VALPROATE  Current Medications: No current facility-administered medications for this visit.   No current outpatient medications on file.   Facility-Administered Medications Ordered in Other Visits  Medication Dose Route Frequency Provider Last Rate Last Admin   acetaminophen (TYLENOL) tablet 650 mg  650 mg Oral Q6H PRN Mansy, Jan A, MD       Or   acetaminophen (TYLENOL) suppository 650 mg  650 mg Rectal Q6H PRN Mansy, Jan A, MD       acyclovir (ZOVIRAX) 200 MG capsule 400 mg  400 mg Oral BID Mansy, Jan A, MD   400 mg at 03/14/21 1034   atenolol (TENORMIN) tablet 25 mg  25 mg Oral Daily Mansy, Jan A, MD   25 mg at 03/14/21 1032   atorvastatin (LIPITOR) tablet  20 mg  20 mg Oral Daily Mansy, Jan A, MD   20 mg at 03/14/21 1032   buPROPion (WELLBUTRIN XL) 24 hr tablet 150 mg  150 mg  Oral Daily Mansy, Jan A, MD   150 mg at 03/14/21 1032   busPIRone (BUSPAR) tablet 5 mg  5 mg Oral BID Mansy, Jan A, MD   5 mg at 03/14/21 1031   chlorpheniramine-HYDROcodone (TUSSIONEX) 10-8 MG/5ML suspension 5 mL  5 mL Oral Q12H PRN Mansy, Jan A, MD       cyclobenzaprine (FLEXERIL) tablet 10 mg  10 mg Oral QHS Mansy, Jan A, MD       enoxaparin (LOVENOX) injection 40 mg  40 mg Subcutaneous Q24H Mansy, Jan A, MD   40 mg at 03/14/21 0753   guaiFENesin (MUCINEX) 12 hr tablet 600 mg  600 mg Oral BID Mansy, Jan A, MD   600 mg at 03/14/21 1032   hydrochlorothiazide (HYDRODIURIL) tablet 25 mg  25 mg Oral Daily Mansy, Jan A, MD   25 mg at 03/14/21 1032   [START ON 03/15/2021] influenza vac split quadrivalent PF (FLUARIX) injection 0.5 mL  0.5 mL Intramuscular Tomorrow-1000 Mansy, Jan A, MD       insulin aspart (novoLOG) injection 0-20 Units  0-20 Units Subcutaneous TID WC Enzo Bi, MD   11 Units at 03/14/21 1241   insulin aspart (novoLOG) injection 0-5 Units  0-5 Units Subcutaneous QHS Enzo Bi, MD       ipratropium-albuterol (DUONEB) 0.5-2.5 (3) MG/3ML nebulizer solution 3 mL  3 mL Nebulization QID Mansy, Jan A, MD   3 mL at 03/14/21 1241   levothyroxine (SYNTHROID) tablet 25 mcg  25 mcg Oral Q0600 Mansy, Jan A, MD   25 mcg at 03/14/21 0452   methylPREDNISolone sodium succinate (SOLU-MEDROL) 40 mg/mL injection 40 mg  40 mg Intravenous Q12H Mansy, Jan A, MD   40 mg at 03/14/21 0416   Followed by   Derrill Memo ON 03/15/2021] predniSONE (DELTASONE) tablet 40 mg  40 mg Oral Q breakfast Mansy, Jan A, MD       montelukast (SINGULAIR) tablet 10 mg  10 mg Oral QHS Mansy, Jan A, MD       nystatin (MYCOSTATIN) 100000 UNIT/ML suspension 500,000 Units  5 mL Oral QID Mansy, Jan A, MD   500,000 Units at 03/14/21 1036   nystatin-triamcinolone ointment (MYCOLOG) 1 application  1 application Topical BID PRN Mansy, Jan A, MD       olopatadine (PATANOL) 0.1 % ophthalmic solution 1 drop  1 drop Both Eyes BID Mansy, Jan A, MD   1  drop at 03/14/21 1036   ondansetron (ZOFRAN) tablet 4 mg  4 mg Oral Q6H PRN Mansy, Jan A, MD       Or   ondansetron [facility]) injection 4 mg  4 mg Intravenous Q6H PRN Mansy, Jan A, MD       pantoprazole (PROTONIX) EC tablet 40 mg  40 mg Oral Daily Mansy, Jan A, MD   40 mg at 03/14/21 1032   polyethylene glycol (MIRALAX / GLYCOLAX) packet 17 g  17 g Oral Daily PRN Mansy, Jan A, MD       polyethylene glycol (MIRALAX / GLYCOLAX) packet 17 g  17 g Oral Daily PRN Mansy, Jan A, MD       pregabalin (LYRICA) capsule 150 mg  150 mg Oral QHS Mansy, Jan A, MD       QUEtiapine (SEROQUEL) tablet 50 mg  50 mg Oral  QHS Mansy, Jan A, MD       roflumilast [place]) tablet 500 mcg  500 mcg Oral Daily Mansy, Jan A, MD   500 mcg at 03/14/21 1035   traZODone (DESYREL) tablet 100 mg  100 mg Oral QHS Mansy, Jan A, MD       traZODone (DESYREL) tablet 25 mg  25 mg Oral QHS PRN Mansy, Jan A, MD   25 mg at 03/14/21 0416   varenicline (CHANTIX) tablet 1 mg  1 mg Oral BID Mansy, Jan A, MD   1 mg at 03/14/21 1033   venlafaxine XR (EFFEXOR-XR) 24 hr capsule 150 mg  150 mg Oral Q breakfast Mansy, Jan A, MD   150 mg at 03/14/21 8657   witch hazel-glycerin (TUCKS) pad   Topical PRN Mansy, Arvella Merles, MD         Psychiatric Specialty Exam: Review of Systems  Cardiovascular:  Negative for chest pain.  Psychiatric/Behavioral:  Negative for substance abuse and suicidal ideas.    There were no vitals taken for this visit.There is no height or weight on file to calculate BMI.  General Appearance:  Eye Contact:   Speech:  Slow  Volume:  Decreased  Mood: fair  Affect:    Thought Process:  Goal Directed  Orientation:  Full (Time, Place, and Person)  Thought Content: linear  Suicidal Thoughts:  No  Homicidal Thoughts:  No  Memory:  Immediate;   Fair Recent;   Fair  Judgement:  Fair  Insight:  Shallow  Psychomotor Activity:  Decreased  Concentration:  Concentration: Fair and Attention Span: Fair  Recall:  [employer] of  Knowledge:Fair  Language: Fair  Akathisia:  No  Handed:    AIMS (if indicated): no involuntary movements  Assets:  Desire for Improvement Leisure Time Social Support  ADL's: limited due to recent surgery  Cognition: WNL  Sleep:   variable   Screenings: Mini-Mental    Flowsheet Row Clinical Support from 04/06/2018 in [place]  Total Score (max 30 points ) 29      PHQ2-9    Flowsheet Row Chronic Care Management from 01/31/2021 in [place] at [facility] Visit from 12/19/2020 in [place] at [facility] Visit from 09/19/2020 in [place] at [facility] Management from 05/30/2020 in [place] at [facility] Management from 05/05/2020 in [place] at [facility]  PHQ-2 Total Score 0 2 2 2 2   PHQ-9 Total Score -- 2 7 -- 9      Flowsheet Row ED to Hosp-Admission (Current) from 03/13/2021 in [place] (1A) Video Visit from 12/19/2020 in [place] Video Visit from 10/25/2020 in [place] No Risk No Risk No Risk       Assessment and Plan: as follows   Prior documentation reviewed    Mood disorder unspecified: relavant to multiple medical concerns and past history of using drugs, doing fair, COPD exacerbation efects her, understands to stop smoking Has some support now and friend to live with whom she will pay rent Continue wellbutrin, seroquel  PTSD;baseline, continue meds and therapy Trazadone for sleep, reviewed sleep hygiene  Fu 49m.   [name], MD 1/18/20231:33 PM

## 2021-03-15 ENCOUNTER — Ambulatory Visit: Payer: Medicare Other | Admitting: Internal Medicine

## 2021-03-15 LAB — GLUCOSE, CAPILLARY
Glucose-Capillary: 161 mg/dL — ABNORMAL HIGH (ref 70–99)
Glucose-Capillary: 184 mg/dL — ABNORMAL HIGH (ref 70–99)
Glucose-Capillary: 163 mg/dL — ABNORMAL HIGH (ref 70–99)
Glucose-Capillary: 237 mg/dL — ABNORMAL HIGH (ref 70–99)
Glucose-Capillary: 209 mg/dL — ABNORMAL HIGH (ref 70–99)

## 2021-03-15 LAB — CBC
HCT: 33.4 % — ABNORMAL LOW (ref 36.0–46.0)
Hemoglobin: 11 g/dL — ABNORMAL LOW (ref 12.0–15.0)
MCH: 29.3 pg (ref 26.0–34.0)
MCHC: 32.9 g/dL (ref 30.0–36.0)
MCV: 89.1 fL (ref 80.0–100.0)
Platelets: 282 10*3/uL (ref 150–400)
RBC: 3.75 MIL/uL — ABNORMAL LOW (ref 3.87–5.11)
RDW: 14.9 % (ref 11.5–15.5)
WBC: 14.2 10*3/uL — ABNORMAL HIGH (ref 4.0–10.5)
nRBC: 0 % (ref 0.0–0.2)

## 2021-03-15 LAB — BASIC METABOLIC PANEL
Anion gap: 9 (ref 5–15)
BUN: 16 mg/dL (ref 6–20)
CO2: 27 mmol/L (ref 22–32)
Calcium: 8.9 mg/dL (ref 8.9–10.3)
Chloride: 100 mmol/L (ref 98–111)
Creatinine, Ser: 0.88 mg/dL (ref 0.44–1.00)
GFR, Estimated: >60 mL/min (ref >60)
Glucose, Bld: 220 mg/dL — ABNORMAL HIGH (ref 70–99)
Potassium: 3.4 mmol/L — ABNORMAL LOW (ref 3.5–5.1)
Sodium: 136 mmol/L (ref 135–145)

## 2021-03-15 LAB — MAGNESIUM: Magnesium: 1.7 mg/dL (ref 1.7–2.4)

## 2021-03-15 MED ORDER — GUAIFENESIN-DM 100-10 MG/5ML PO SYRP
10.0000 mL | ORAL_SOLUTION | Freq: Four times a day (QID) | ORAL | Status: DC | PRN
  Administered 2021-03-16: 10 mL via ORAL
  Filled 2021-03-15: qty 10

## 2021-03-15 MED ORDER — ALBUTEROL SULFATE (2.5 MG/3ML) 0.083% IN NEBU: 2.5000 mg | INHALATION_SOLUTION | Freq: Once | RESPIRATORY_TRACT | Status: AC

## 2021-03-15 MED ORDER — ALBUTEROL SULFATE (2.5 MG/3ML) 0.083% IN NEBU
INHALATION_SOLUTION | RESPIRATORY_TRACT | Status: AC
  Administered 2021-03-15: 2.5 mg via RESPIRATORY_TRACT
  Filled 2021-03-15: qty 3

## 2021-03-15 MED ORDER — POTASSIUM CHLORIDE CRYS ER 20 MEQ PO TBCR
40.0000 meq | EXTENDED_RELEASE_TABLET | Freq: Once | ORAL | Status: AC
  Administered 2021-03-15: 40 meq via ORAL
  Filled 2021-03-15: qty 2

## 2021-03-15 NOTE — Progress Notes (Signed)
PROGRESS NOTE    Joan [name]  JJH:[id number] DOB: [birth year] DOA: 03/13/2021 PCP: [name], MD  158A/158A-AA   Assessment & Plan:   Active Problems:   COPD exacerbation ([place])   Joan [name] is a 61 y.o. Caucasian female with medical history significant for anxiety, bipolar disorder, asthma, COPD, depression, type 2 diabetes mellitus with diabetic neuropathy, hypothyroidism and GERD, presented to the ER with acute onset of worsening dyspnea with associated productive cough of yellowish, occasionally brownish and clear sputum and wheezing which have been going on over the last couple of weeks and got significantly worse for the last couple of days.  She is on home O2 at 3 L/min at baseline.  She has been using her nebulizer at home.  No fever or chills.  No chest pain or palpitations.  No hemoptysis.  No nausea or vomiting or abdominal pain.  No dysuria, oliguria or hematuria or flank pain.  She has been vaccinated for COVID-19 but never had booster.  She continues to smoke half a pack of cigarettes per day.  1.  COPD and asthma acute exacerbation likely secondary to acute bronchitis  Chronic respiratory failure on home 3L O2. --started on IV solumedrol and scheduled nebs on admission. --on home 3L O2. Plan: --transition to prednisone today --cont scheduled Nebs --flutter valve --RVP  2.  Hypokalemia. --monitor and replete PRN  3.  Hypothyroidism. - We will continue Synthroid.  4.  Type 2 diabetes mellitus, well controlled with peripheral neuropathy. Hyperglycemia exacerbated by steroid use --SSI for now --cont home Lyrica  5.  Essential hypertension. --cont atenolol and HCTZ  6.  Dyslipidemia. - We will continue statin therapy.  7.  Ongoing tobacco abuse. - smoking cessation advised.    DVT prophylaxis: Lovenox SQ Code Status: Full code  Family Communication:  Level of care: Telemetry Medical Dispo:   The patient is from:  home Anticipated d/c is to: home Anticipated d/c date is: 1-2 days Patient currently is not medically ready to d/c due to: significant dyspnea   Subjective and Interval History:  Pt complained of bad cough, and difficulty bringing up sputum, and believed Mucinex made it worse.  Feeling congested in her chest.   Objective: Vitals:   03/15/21 0317 03/15/21 0758 03/15/21 1125 03/15/21 1619  BP: 113/71 (!) 141/79 (!) 117/57 129/81  Pulse: 68 72 68 73  Resp: 18 (!) 22 19 20   Temp: (!) 97.5 F (36.4 C) 97.6 F (36.4 C) 97.8 F (36.6 C) 97.7 F (36.5 C)  TempSrc:      SpO2: 97% 96% 99% 96%  Weight:      Height:        Intake/Output Summary (Last 24 hours) at 03/15/2021 1642 Last data filed at 03/15/2021 0317 Gross per 24 hour  Intake 240 ml  Output 0 ml  Net 240 ml   Filed Weights   03/13/21 2243  Weight: 90.3 kg    Examination:   Constitutional: NAD, AAOx3 HEENT: conjunctivae and lids normal, EOMI CV: No cyanosis.   RESP: diffuse wheezing, on 3L Neuro: II - XII grossly intact.   Psych: Normal mood and affect.  Appropriate judgement and reason   Data Reviewed: I have personally reviewed following labs and imaging studies  CBC: Recent Labs  Lab 03/13/21 2251 03/14/21 0633 03/15/21 0603  WBC 11.4* 7.2 14.2*  NEUTROABS 6.9  --   --   HGB 13.4 13.0 11.0*  HCT 39.7 39.4 33.4*  MCV 89.0 89.5 89.1  PLT 392 271 660   Basic Metabolic Panel: Recent Labs  Lab 03/13/21 2344 03/14/21 0633 03/15/21 0603  NA 137 134* 136  K 2.9* 3.9 3.4*  CL 96* 94* 100  CO2 31 30 27   GLUCOSE 118* 359* 220*  BUN 11 13 16   CREATININE 1.09* 1.11* 0.88  CALCIUM 9.3 9.1 8.9  MG  --   --  1.7   GFR: Estimated Creatinine Clearance: 81 mL/min (by C-G formula based on SCr of 0.88 mg/dL). Liver Function Tests: Recent Labs  Lab 03/13/21 2344  AST 25  ALT 17  ALKPHOS 106  BILITOT 0.8  PROT 6.6  ALBUMIN 3.5   No results for input(s): LIPASE, AMYLASE in the last 168 hours. No  results for input(s): AMMONIA in the last 168 hours. Coagulation Profile: No results for input(s): INR, PROTIME in the last 168 hours. Cardiac Enzymes: No results for input(s): CKTOTAL, CKMB, CKMBINDEX, TROPONINI in the last 168 hours. BNP (last 3 results) No results for input(s): PROBNP in the last 8760 hours. HbA1C: Recent Labs    03/14/21 0633  HGBA1C 6.8*   CBG: Recent Labs  Lab 03/14/21 2030 03/15/21 0800 03/15/21 1018 03/15/21 1126 03/15/21 1620  GLUCAP 187* 161* 184* 163* 237*   Lipid Profile: No results for input(s): CHOL, HDL, LDLCALC, TRIG, CHOLHDL, LDLDIRECT in the last 72 hours. Thyroid Function Tests: No results for input(s): TSH, T4TOTAL, FREET4, T3FREE, THYROIDAB in the last 72 hours. Anemia Panel: No results for input(s): VITAMINB12, FOLATE, FERRITIN, TIBC, IRON, RETICCTPCT in the last 72 hours. Sepsis Labs: No results for input(s): PROCALCITON, LATICACIDVEN in the last 168 hours.  Recent Results (from the past 240 hour(s))  Resp Panel by RT-PCR (Flu A&B, Covid) Nasopharyngeal Swab     Status: None   Collection Time: 03/13/21 11:11 PM   Specimen: Nasopharyngeal Swab; Nasopharyngeal(NP) swabs in vial transport medium  Result Value Ref Range Status   SARS Coronavirus 2 by RT PCR NEGATIVE NEGATIVE Final    Comment: (NOTE) SARS-CoV-2 target nucleic acids are NOT DETECTED.  The SARS-CoV-2 RNA is generally detectable in upper respiratory specimens during the acute phase of infection. The lowest concentration of SARS-CoV-2 viral copies this assay can detect is 138 copies/mL. A negative result does not preclude SARS-Cov-2 infection and should not be used as the sole basis for treatment or other patient management decisions. A negative result may occur with  improper specimen collection/handling, submission of specimen other than nasopharyngeal swab, presence of viral mutation(s) within the areas targeted by this assay, and inadequate number of  viral copies(<138 copies/mL). A negative result must be combined with clinical observations, patient history, and epidemiological information. The expected result is Negative.  Fact Sheet for Patients:  EntrepreneurPulse.com.au  Fact Sheet for Healthcare Providers:  IncredibleEmployment.be  This test is no t yet approved or cleared by the [place] FDA and  has been authorized for detection and/or diagnosis of SARS-CoV-2 by FDA under an Emergency Use Authorization (EUA). This EUA will remain  in effect (meaning this test can be used) for the duration of the COVID-19 declaration under Section 564(b)(1) of the Act, 21 U.S.C.section 360bbb-3(b)(1), unless the authorization is terminated  or revoked sooner.       Influenza A by PCR NEGATIVE NEGATIVE Final   Influenza B by PCR NEGATIVE NEGATIVE Final    Comment: (NOTE) The Xpert Xpress SARS-CoV-2/FLU/RSV plus assay is intended as an aid in the diagnosis of influenza from Nasopharyngeal swab specimens and should not be used as  a sole basis for treatment. Nasal washings and aspirates are unacceptable for Xpert Xpress SARS-CoV-2/FLU/RSV testing.  Fact Sheet for Patients: EntrepreneurPulse.com.au  Fact Sheet for Healthcare Providers: IncredibleEmployment.be  This test is not yet approved or cleared by the [place] FDA and has been authorized for detection and/or diagnosis of SARS-CoV-2 by FDA under an Emergency Use Authorization (EUA). This EUA will remain in effect (meaning this test can be used) for the duration of the COVID-19 declaration under Section 564(b)(1) of the Act, 21 U.S.C. section 360bbb-3(b)(1), unless the authorization is terminated or revoked.  Performed at [facility], [address], [place], [place] [address]       Radiology Studies: DG Chest [place] 1 View  Result Date: 03/13/2021 CLINICAL DATA:  Shortness of breath  for 2 weeks, initial encounter EXAM: PORTABLE CHEST 1 VIEW COMPARISON:  08/10/2020 FINDINGS: Cardiac shadow is within normal limits. Aortic calcifications are noted. Lungs are mildly hyperinflated. No bony abnormality is seen. IMPRESSION: Mild COPD without acute abnormality. Electronically Signed   By: Inez Catalina M.D.   On: 03/13/2021 23:32     Scheduled Meds:  acyclovir  400 mg Oral BID   atenolol  25 mg Oral Daily   atorvastatin  20 mg Oral Daily   buPROPion  150 mg Oral Daily   busPIRone  5 mg Oral BID   cyclobenzaprine  10 mg Oral QHS   enoxaparin (LOVENOX) injection  40 mg Subcutaneous Q24H   hydrochlorothiazide  25 mg Oral Daily   insulin aspart  0-20 Units Subcutaneous TID WC   insulin aspart  0-5 Units Subcutaneous QHS   ipratropium-albuterol  3 mL Nebulization QID   levothyroxine  25 mcg Oral Q0600   montelukast  10 mg Oral QHS   nystatin  5 mL Oral QID   olopatadine  1 drop Both Eyes BID   pantoprazole  40 mg Oral Daily   predniSONE  40 mg Oral Q breakfast   pregabalin  150 mg Oral QHS   QUEtiapine  50 mg Oral QHS   roflumilast  500 mcg Oral Daily   traZODone  100 mg Oral QHS   varenicline  1 mg Oral BID   venlafaxine XR  150 mg Oral Q breakfast   Continuous Infusions:   LOS: 1 day     Enzo Bi, MD [facility] If 7PM-7AM, please contact night-coverage 03/15/2021, 4:42 PM

## 2021-03-16 LAB — CBC
HCT: 36.7 % (ref 36.0–46.0)
Hemoglobin: 12.1 g/dL (ref 12.0–15.0)
MCH: 29.7 pg (ref 26.0–34.0)
MCHC: 33 g/dL (ref 30.0–36.0)
MCV: 90 fL (ref 80.0–100.0)
Platelets: 273 10*3/uL (ref 150–400)
RBC: 4.08 MIL/uL (ref 3.87–5.11)
RDW: 15.2 % (ref 11.5–15.5)
WBC: 11 10*3/uL — ABNORMAL HIGH (ref 4.0–10.5)
nRBC: 0 % (ref 0.0–0.2)

## 2021-03-16 LAB — RESPIRATORY PANEL BY PCR

## 2021-03-16 LAB — GLUCOSE, CAPILLARY
Glucose-Capillary: 236 mg/dL — ABNORMAL HIGH (ref 70–99)
Glucose-Capillary: 229 mg/dL — ABNORMAL HIGH (ref 70–99)
Glucose-Capillary: 256 mg/dL — ABNORMAL HIGH (ref 70–99)
Glucose-Capillary: 205 mg/dL — ABNORMAL HIGH (ref 70–99)

## 2021-03-16 LAB — BASIC METABOLIC PANEL
Anion gap: 8 (ref 5–15)
BUN: 21 mg/dL — ABNORMAL HIGH (ref 6–20)
CO2: 30 mmol/L (ref 22–32)
Calcium: 9.3 mg/dL (ref 8.9–10.3)
Chloride: 99 mmol/L (ref 98–111)
Creatinine, Ser: 0.97 mg/dL (ref 0.44–1.00)
GFR, Estimated: >60 mL/min (ref >60)
Glucose, Bld: 210 mg/dL — ABNORMAL HIGH (ref 70–99)
Potassium: 3.4 mmol/L — ABNORMAL LOW (ref 3.5–5.1)
Sodium: 137 mmol/L (ref 135–145)

## 2021-03-16 LAB — MAGNESIUM: Magnesium: 1.7 mg/dL (ref 1.7–2.4)

## 2021-03-16 MED ORDER — GUAIFENESIN-DM 100-10 MG/5ML PO SYRP
10.0000 mL | ORAL_SOLUTION | Freq: Three times a day (TID) | ORAL | Status: DC
  Administered 2021-03-16 – 2021-03-19 (×8): 10 mL via ORAL
  Filled 2021-03-16 (×9): qty 10

## 2021-03-16 MED ORDER — POTASSIUM CHLORIDE CRYS ER 20 MEQ PO TBCR
40.0000 meq | EXTENDED_RELEASE_TABLET | Freq: Once | ORAL | Status: AC
  Administered 2021-03-16: 40 meq via ORAL
  Filled 2021-03-16: qty 2

## 2021-03-16 MED ORDER — ALBUTEROL SULFATE (2.5 MG/3ML) 0.083% IN NEBU
2.5000 mg | INHALATION_SOLUTION | RESPIRATORY_TRACT | Status: DC | PRN
  Administered 2021-03-16 – 2021-03-17 (×2): 2.5 mg via RESPIRATORY_TRACT
  Filled 2021-03-16 (×2): qty 3

## 2021-03-16 MED ORDER — ALPRAZOLAM 0.25 MG PO TABS
0.2500 mg | ORAL_TABLET | Freq: Every day | ORAL | Status: DC | PRN
  Administered 2021-03-17: 0.25 mg via ORAL
  Filled 2021-03-16: qty 1

## 2021-03-16 MED ORDER — ACETYLCYSTEINE 20 % IN SOLN
4.0000 mL | Freq: Two times a day (BID) | RESPIRATORY_TRACT | Status: DC
  Administered 2021-03-16 – 2021-03-19 (×6): 4 mL via RESPIRATORY_TRACT
  Filled 2021-03-16 (×8): qty 4

## 2021-03-16 MED ORDER — ALPRAZOLAM 0.5 MG PO TABS
0.5000 mg | ORAL_TABLET | Freq: Once | ORAL | Status: AC
  Administered 2021-03-16: 0.5 mg via ORAL
  Filled 2021-03-16: qty 1

## 2021-03-16 MED ORDER — IPRATROPIUM-ALBUTEROL 0.5-2.5 (3) MG/3ML IN SOLN
3.0000 mL | Freq: Four times a day (QID) | RESPIRATORY_TRACT | Status: DC
  Administered 2021-03-16 – 2021-03-19 (×12): 3 mL via RESPIRATORY_TRACT
  Filled 2021-03-16 (×11): qty 3

## 2021-03-16 NOTE — Progress Notes (Signed)
°   03/16/21 2300  Clinical Encounter Type  Visited With Patient  Visit Type Initial  Referral From Nurse  Consult/Referral To Chaplain   Chaplain responded to rapid response. Pt. improved after interventions. Pt. requested prayer. Chaplain provided non-anxious presence, reflective listening, and prayer. Pt. appreciated chaplain visit.

## 2021-03-16 NOTE — Progress Notes (Signed)
Inpatient Diabetes Program Recommendations  AACE/ADA: New Consensus Statement on Inpatient Glycemic Control (2015)  Target Ranges:  Prepandial:   less than 140 mg/dL      Peak postprandial:   less than 180 mg/dL (1-2 hours)      Critically ill patients:  140 - 180 mg/dL    Latest Reference Range & Units 03/15/21 08:00 03/15/21 10:18 03/15/21 11:26 03/15/21 16:20 03/15/21 20:48  Glucose-Capillary 70 - 99 mg/dL 161 (H) 184 (H) 163 (H) 237 (H) 209 (H)    Latest Reference Range & Units 03/16/21 07:45 03/16/21 11:50  Glucose-Capillary 70 - 99 mg/dL 236 (H) 229 (H)     Home DM Meds: None listed  Current Orders: Novolog 0-20 units TID ac/hs    Getting Prednisone 40 mg Daily   MD- Please consider statring Novolog Meal Coverage while pt remains on Prednisone  Novolog 4 units TID with meals HOLD if pt eats <50% meals    --Will follow patient during hospitalization--  Wyn Quaker RN, MSN, CDE Diabetes Coordinator Inpatient Glycemic Control Team Team Pager: [phone] (8a-5p)

## 2021-03-16 NOTE — Progress Notes (Signed)
PROGRESS NOTE    Joan [name]  IOE:[id number] DOB: 1960-05-05 DOA: 03/13/2021 PCP: [name], MD  158A/158A-AA   Assessment & Plan:   Active Problems:   COPD exacerbation ([place])   Joan [name] is a 61 y.o. Caucasian female with medical history significant for anxiety, bipolar disorder, asthma, COPD, depression, type 2 diabetes mellitus with diabetic neuropathy, hypothyroidism and GERD, presented to the ER with acute onset of worsening dyspnea with associated productive cough of yellowish, occasionally brownish and clear sputum and wheezing which have been going on over the last couple of weeks and got significantly worse for the last couple of days.  She is on home O2 at 3 L/min at baseline.  She has been using her nebulizer at home.  No fever or chills.  No chest pain or palpitations.  No hemoptysis.  No nausea or vomiting or abdominal pain.  No dysuria, oliguria or hematuria or flank pain.  She has been vaccinated for COVID-19 but never had booster.  She continues to smoke half a pack of cigarettes per day.  1.  COPD and asthma acute exacerbation likely secondary to acute bronchitis  Chronic respiratory failure on home 3L O2. --started on IV solumedrol and scheduled nebs on admission. --on home 3L O2. Plan: --cont prednisone 40 mg daily --cont scheduled Nebs --flutter valve --Mucomyst neb BID --schedule Robitussin  2.  Hypokalemia. --monitor and replete PRN  3.  Hypothyroidism. --cont home Synthroid  4.  Type 2 diabetes mellitus, well controlled with peripheral neuropathy. Hyperglycemia exacerbated by steroid use --SSI for now --cont home Lyrica  5.  Essential hypertension. --cont atenolol and HCTZ  6.  Dyslipidemia. --cont statin  7.  Ongoing tobacco abuse. - smoking cessation advised. --resume Chantix    DVT prophylaxis: Lovenox SQ Code Status: Full code  Family Communication:  Level of care: Med-Surg Dispo:   The patient is from:  home Anticipated d/c is to: home Anticipated d/c date is: 1-2 days Patient currently is not medically ready to d/c due to: significant dyspnea and cough   Subjective and Interval History:  Pt had an episode of respiratory distress caused by coughing spell.  Still complained of not being able to cough up sputum.   Objective: Vitals:   03/16/21 1147 03/16/21 1207 03/16/21 1345 03/16/21 1610  BP: 134/88 (!) 166/90  127/79  Pulse: 85 92 72 71  Resp: 18  18 18   Temp: 97.9 F (36.6 C)   97.8 F (36.6 C)  TempSrc:      SpO2: 95% 95% 93% 100%  Weight:      Height:        Intake/Output Summary (Last 24 hours) at 03/16/2021 1816 Last data filed at 03/16/2021 1031 Gross per 24 hour  Intake 240 ml  Output --  Net 240 ml   Filed Weights   03/13/21 2243  Weight: 90.3 kg    Examination:   Constitutional: NAD, AAOx3 HEENT: conjunctivae and lids normal, EOMI CV: No cyanosis.   RESP: wheezes, congested cough, on 3L Neuro: II - XII grossly intact.   Psych: Normal mood and affect.  Appropriate judgement and reason   Data Reviewed: I have personally reviewed following labs and imaging studies  CBC: Recent Labs  Lab 03/13/21 2251 03/14/21 0633 03/15/21 0603 03/16/21 0612  WBC 11.4* 7.2 14.2* 11.0*  NEUTROABS 6.9  --   --   --   HGB 13.4 13.0 11.0* 12.1  HCT 39.7 39.4 33.4* 36.7  MCV 89.0 89.5  89.1 90.0  PLT 392 271 282 009   Basic Metabolic Panel: Recent Labs  Lab 03/13/21 2344 03/14/21 0633 03/15/21 0603 03/16/21 0612  NA 137 134* 136 137  K 2.9* 3.9 3.4* 3.4*  CL 96* 94* 100 99  CO2 31 30 27 30   GLUCOSE 118* 359* 220* 210*  BUN 11 13 16  21*  CREATININE 1.09* 1.11* 0.88 0.97  CALCIUM 9.3 9.1 8.9 9.3  MG  --   --  1.7 1.7   GFR: Estimated Creatinine Clearance: 73.4 mL/min (by C-G formula based on SCr of 0.97 mg/dL). Liver Function Tests: Recent Labs  Lab 03/13/21 2344  AST 25  ALT 17  ALKPHOS 106  BILITOT 0.8  PROT 6.6  ALBUMIN 3.5   No results for  input(s): LIPASE, AMYLASE in the last 168 hours. No results for input(s): AMMONIA in the last 168 hours. Coagulation Profile: No results for input(s): INR, PROTIME in the last 168 hours. Cardiac Enzymes: No results for input(s): CKTOTAL, CKMB, CKMBINDEX, TROPONINI in the last 168 hours. BNP (last 3 results) No results for input(s): PROBNP in the last 8760 hours. HbA1C: Recent Labs    03/14/21 0633  HGBA1C 6.8*   CBG: Recent Labs  Lab 03/15/21 1620 03/15/21 2048 03/16/21 0745 03/16/21 1150 03/16/21 1638  GLUCAP 237* 209* 236* 229* 256*   Lipid Profile: No results for input(s): CHOL, HDL, LDLCALC, TRIG, CHOLHDL, LDLDIRECT in the last 72 hours. Thyroid Function Tests: No results for input(s): TSH, T4TOTAL, FREET4, T3FREE, THYROIDAB in the last 72 hours. Anemia Panel: No results for input(s): VITAMINB12, FOLATE, FERRITIN, TIBC, IRON, RETICCTPCT in the last 72 hours. Sepsis Labs: No results for input(s): PROCALCITON, LATICACIDVEN in the last 168 hours.  Recent Results (from the past 240 hour(s))  Resp Panel by RT-PCR (Flu A&B, Covid) Nasopharyngeal Swab     Status: None   Collection Time: 03/13/21 11:11 PM   Specimen: Nasopharyngeal Swab; Nasopharyngeal(NP) swabs in vial transport medium  Result Value Ref Range Status   SARS Coronavirus 2 by RT PCR NEGATIVE NEGATIVE Final    Comment: (NOTE) SARS-CoV-2 target nucleic acids are NOT DETECTED.  The SARS-CoV-2 RNA is generally detectable in upper respiratory specimens during the acute phase of infection. The lowest concentration of SARS-CoV-2 viral copies this assay can detect is 138 copies/mL. A negative result does not preclude SARS-Cov-2 infection and should not be used as the sole basis for treatment or other patient management decisions. A negative result may occur with  improper specimen collection/handling, submission of specimen other than nasopharyngeal swab, presence of viral mutation(s) within the areas targeted by  this assay, and inadequate number of viral copies(<138 copies/mL). A negative result must be combined with clinical observations, patient history, and epidemiological information. The expected result is Negative.  Fact Sheet for Patients:  EntrepreneurPulse.com.au  Fact Sheet for Healthcare Providers:  IncredibleEmployment.be  This test is no t yet approved or cleared by the [place] FDA and  has been authorized for detection and/or diagnosis of SARS-CoV-2 by FDA under an Emergency Use Authorization (EUA). This EUA will remain  in effect (meaning this test can be used) for the duration of the COVID-19 declaration under Section 564(b)(1) of the Act, 21 U.S.C.section 360bbb-3(b)(1), unless the authorization is terminated  or revoked sooner.       Influenza A by PCR NEGATIVE NEGATIVE Final   Influenza B by PCR NEGATIVE NEGATIVE Final    Comment: (NOTE) The Xpert Xpress SARS-CoV-2/FLU/RSV plus assay is intended as an aid  in the diagnosis of influenza from Nasopharyngeal swab specimens and should not be used as a sole basis for treatment. Nasal washings and aspirates are unacceptable for Xpert Xpress SARS-CoV-2/FLU/RSV testing.  Fact Sheet for Patients: EntrepreneurPulse.com.au  Fact Sheet for Healthcare Providers: IncredibleEmployment.be  This test is not yet approved or cleared by the [place] FDA and has been authorized for detection and/or diagnosis of SARS-CoV-2 by FDA under an Emergency Use Authorization (EUA). This EUA will remain in effect (meaning this test can be used) for the duration of the COVID-19 declaration under Section 564(b)(1) of the Act, 21 U.S.C. section 360bbb-3(b)(1), unless the authorization is terminated or revoked.  Performed at [facility], [place], [place] [address]   Respiratory (~20 pathogens) panel by PCR     Status: None   Collection  Time: 03/15/21  5:05 PM   Specimen: Nasopharyngeal Swab; Respiratory  Result Value Ref Range Status   Adenovirus NOT DETECTED NOT DETECTED Final   Coronavirus 229E NOT DETECTED NOT DETECTED Final    Comment: (NOTE) The Coronavirus on the Respiratory Panel, DOES NOT test for the novel  Coronavirus (2019 nCoV)    Coronavirus HKU1 NOT DETECTED NOT DETECTED Final   Coronavirus NL63 NOT DETECTED NOT DETECTED Final   Coronavirus OC43 NOT DETECTED NOT DETECTED Final   Metapneumovirus NOT DETECTED NOT DETECTED Final   Rhinovirus / Enterovirus NOT DETECTED NOT DETECTED Final   Influenza A NOT DETECTED NOT DETECTED Final   Influenza B NOT DETECTED NOT DETECTED Final   Parainfluenza Virus 1 NOT DETECTED NOT DETECTED Final   Parainfluenza Virus 2 NOT DETECTED NOT DETECTED Final   Parainfluenza Virus 3 NOT DETECTED NOT DETECTED Final   Parainfluenza Virus 4 NOT DETECTED NOT DETECTED Final   Respiratory Syncytial Virus NOT DETECTED NOT DETECTED Final   Bordetella pertussis NOT DETECTED NOT DETECTED Final   Bordetella Parapertussis NOT DETECTED NOT DETECTED Final   Chlamydophila pneumoniae NOT DETECTED NOT DETECTED Final   Mycoplasma pneumoniae NOT DETECTED NOT DETECTED Final    Comment: Performed at [facility], [place]. [address], [place],  [address]      Radiology Studies: No results found.   Scheduled Meds:  acetylcysteine  4 mL Nebulization BID   acyclovir  400 mg Oral BID   atenolol  25 mg Oral Daily   atorvastatin  20 mg Oral Daily   buPROPion  150 mg Oral Daily   busPIRone  5 mg Oral BID   cyclobenzaprine  10 mg Oral QHS   enoxaparin (LOVENOX) injection  40 mg Subcutaneous Q24H   hydrochlorothiazide  25 mg Oral Daily   insulin aspart  0-20 Units Subcutaneous TID WC   insulin aspart  0-5 Units Subcutaneous QHS   ipratropium-albuterol  3 mL Nebulization Q6H   levothyroxine  25 mcg Oral Q0600   montelukast  10 mg Oral QHS   nystatin  5 mL Oral QID   olopatadine  1  drop Both Eyes BID   pantoprazole  40 mg Oral Daily   predniSONE  40 mg Oral Q breakfast   pregabalin  150 mg Oral QHS   QUEtiapine  50 mg Oral QHS   roflumilast  500 mcg Oral Daily   traZODone  100 mg Oral QHS   varenicline  1 mg Oral BID   venlafaxine XR  150 mg Oral Q breakfast   Continuous Infusions:   LOS: 2 days     Enzo Bi, MD [facility] If 7PM-7AM,  please contact night-coverage 03/16/2021, 6:16 PM

## 2021-03-16 NOTE — TOC Progression Note (Signed)
Transition of Care [facility]) - Progression Note    Patient Details  Name: Joan [name] MRN: [id number] Date of Birth: [birth year]  Transition of Care [facility]) CM/SW [place], RN Phone Number: 03/16/2021, 3:46 PM  Clinical Narrative:    Met with the patient to discuss DC plan and needs She lives home alone She has oxygen at home, she doe snot have her portable tank her, she has her house keys and can get into her house, she needs non urgent EMS home, no additional needs        Expected Discharge Plan and Services                                                 Social Determinants of Health (SDOH) Interventions    Readmission Risk Interventions Readmission Risk Prevention Plan 04/07/2019  Transportation Screening Complete  PCP or Specialist Appt within 3-5 Days Complete  HRI or [place] Complete  Social Work Consult for [place] Planning/Counseling Complete  Palliative Care Screening Not Applicable  Medication Review Press photographer) Complete  Some recent data might be hidden

## 2021-03-16 NOTE — Progress Notes (Signed)
Called to a RRT for this patient. Coughing spell that caused anxiety.  Bambi RT at bedside giving neb and primary RN getting cough medicine.   No further interventions needed at this time.

## 2021-03-16 NOTE — Progress Notes (Signed)
Rapid response called, this RT responded for resp distress. Pt receiving breathing tx. Sat in high 90's  with RR in high 20's low 30's. Pt woke up trying to cough, CPAP mask removed and neb Tx given. Pt continues to have a productive cough which takes her breath away. RN is here to give cough medicine.

## 2021-03-16 NOTE — Progress Notes (Signed)
Responded to RR page. Patient had been short of breath with activity. RN explained patient had been given a nebulizer treatment and issue has resolved.

## 2021-03-16 NOTE — Care Management Important Message (Signed)
Important Message  Patient Details  Name: Joan [name] MRN: [id number] Date of Birth: 1960/03/02   Medicare Important Message Given:  Yes     Juliann Pulse A Stanley Helmuth 03/16/2021, 3:01 PM

## 2021-03-16 NOTE — Plan of Care (Signed)

## 2021-03-17 LAB — BASIC METABOLIC PANEL
Anion gap: 12 (ref 5–15)
BUN: 26 mg/dL — ABNORMAL HIGH (ref 6–20)
CO2: 31 mmol/L (ref 22–32)
Calcium: 9.7 mg/dL (ref 8.9–10.3)
Chloride: 95 mmol/L — ABNORMAL LOW (ref 98–111)
Creatinine, Ser: 0.84 mg/dL (ref 0.44–1.00)
GFR, Estimated: >60 mL/min (ref >60)
Glucose, Bld: 180 mg/dL — ABNORMAL HIGH (ref 70–99)
Potassium: 3.6 mmol/L (ref 3.5–5.1)
Sodium: 138 mmol/L (ref 135–145)

## 2021-03-17 LAB — GLUCOSE, CAPILLARY
Glucose-Capillary: 197 mg/dL — ABNORMAL HIGH (ref 70–99)
Glucose-Capillary: 264 mg/dL — ABNORMAL HIGH (ref 70–99)
Glucose-Capillary: 235 mg/dL — ABNORMAL HIGH (ref 70–99)
Glucose-Capillary: 288 mg/dL — ABNORMAL HIGH (ref 70–99)

## 2021-03-17 LAB — CBC
HCT: 40.1 % (ref 36.0–46.0)
Hemoglobin: 12.7 g/dL (ref 12.0–15.0)
MCH: 28.8 pg (ref 26.0–34.0)
MCHC: 31.7 g/dL (ref 30.0–36.0)
MCV: 90.9 fL (ref 80.0–100.0)
Platelets: 319 10*3/uL (ref 150–400)
RBC: 4.41 MIL/uL (ref 3.87–5.11)
RDW: 15.3 % (ref 11.5–15.5)
WBC: 11.7 10*3/uL — ABNORMAL HIGH (ref 4.0–10.5)
nRBC: 0 % (ref 0.0–0.2)

## 2021-03-17 LAB — MAGNESIUM: Magnesium: 2.3 mg/dL (ref 1.7–2.4)

## 2021-03-17 MED ORDER — INSULIN ASPART 100 UNIT/ML IJ SOLN
4.0000 [IU] | Freq: Three times a day (TID) | INTRAMUSCULAR | Status: DC
  Administered 2021-03-17 – 2021-03-19 (×8): 4 [IU] via SUBCUTANEOUS
  Filled 2021-03-17 (×8): qty 1

## 2021-03-17 NOTE — Progress Notes (Signed)
PROGRESS NOTE    Joan [name]  HUO:[id number] DOB: [birth year] DOA: 03/13/2021 PCP: Joan Koch, MD  158A/158A-AA   Assessment & Plan:   Active Problems:   COPD exacerbation ([place])   Joan [name] is a 61 y.o. Caucasian female with medical history significant for anxiety, bipolar disorder, asthma, COPD, depression, type 2 diabetes mellitus with diabetic neuropathy, hypothyroidism and GERD, presented to the ER with acute onset of worsening dyspnea with associated productive cough of yellowish, occasionally brownish and clear sputum and wheezing which have been going on over the last couple of weeks and got significantly worse for the last couple of days.  She is on home O2 at 3 L/min at baseline.  She has been using her nebulizer at home.  No fever or chills.  No chest pain or palpitations.  No hemoptysis.  No nausea or vomiting or abdominal pain.  No dysuria, oliguria or hematuria or flank pain.  She has been vaccinated for COVID-19 but never had booster.  She continues to smoke half a pack of cigarettes per day.  1.  COPD and asthma acute exacerbation likely secondary to acute bronchitis  Chronic respiratory failure on home 3L O2. --started on IV solumedrol and scheduled nebs on admission. --on home 3L O2. Plan: --cont prednisone 40 mg daily --cont scheduled Nebs --flutter valve --Mucomyst neb BID --cont Robitussin as scheduled  2.  Hypokalemia. --monitor and replete PRN  3.  Hypothyroidism. --cont home Synthroid  4.  Type 2 diabetes mellitus, well controlled with peripheral neuropathy. Hyperglycemia exacerbated by steroid use --SSI  --add mealtime 4u TID --cont home Lyrica  5.  Essential hypertension. --cont atenolol and HCTZ  6.  Dyslipidemia. --cont statin  7.  Ongoing tobacco abuse. - smoking cessation advised. --resume Chantix    DVT prophylaxis: Lovenox SQ Code Status: Full code  Family Communication:  Level of care: Med-Surg Dispo:    The patient is from: home Anticipated d/c is to: home Anticipated d/c date is: likely tomorrow Patient currently is not medically ready to d/c due to: significant dyspnea and cough   Subjective and Interval History:  Pt was found not wearing her O2 but in no distress.  Pt reported the mucomyst nebs helped.   Objective: Vitals:   03/17/21 0408 03/17/21 0816 03/17/21 1214 03/17/21 1525  BP: 116/68 132/73 126/79 (!) 110/93  Pulse: 63 67 72 76  Resp: 19 19 20 18   Temp: 98.3 F (36.8 C) (!) 97.2 F (36.2 C) (!) 97.3 F (36.3 C) (!) 97.3 F (36.3 C)  TempSrc:      SpO2: 98% 99%  97%  Weight:      Height:        Intake/Output Summary (Last 24 hours) at 03/17/2021 1612 Last data filed at 03/17/2021 1300 Gross per 24 hour  Intake 360 ml  Output --  Net 360 ml   Filed Weights   03/13/21 2243  Weight: 90.3 kg    Examination:   Constitutional: NAD, AAOx3 HEENT: conjunctivae and lids normal, EOMI CV: No cyanosis.   RESP: diffuse loud ronchi, on RA Extremities: No effusions, edema in BLE SKIN: warm, dry Neuro: II - XII grossly intact.   Psych: Normal mood and affect.     Data Reviewed: I have personally reviewed following labs and imaging studies  CBC: Recent Labs  Lab 03/13/21 2251 03/14/21 0633 03/15/21 0603 03/16/21 0612 03/17/21 0537  WBC 11.4* 7.2 14.2* 11.0* 11.7*  NEUTROABS 6.9  --   --   --   --  HGB 13.4 13.0 11.0* 12.1 12.7  HCT 39.7 39.4 33.4* 36.7 40.1  MCV 89.0 89.5 89.1 90.0 90.9  PLT 392 271 282 273 829   Basic Metabolic Panel: Recent Labs  Lab 03/13/21 2344 03/14/21 0633 03/15/21 0603 03/16/21 0612 03/17/21 0537  NA 137 134* 136 137 138  K 2.9* 3.9 3.4* 3.4* 3.6  CL 96* 94* 100 99 95*  CO2 31 30 27 30 31   GLUCOSE 118* 359* 220* 210* 180*  BUN 11 13 16  21* 26*  CREATININE 1.09* 1.11* 0.88 0.97 0.84  CALCIUM 9.3 9.1 8.9 9.3 9.7  MG  --   --  1.7 1.7 2.3   GFR: Estimated Creatinine Clearance: 84.8 mL/min (by C-G formula based on  SCr of 0.84 mg/dL). Liver Function Tests: Recent Labs  Lab 03/13/21 2344  AST 25  ALT 17  ALKPHOS 106  BILITOT 0.8  PROT 6.6  ALBUMIN 3.5   No results for input(s): LIPASE, AMYLASE in the last 168 hours. No results for input(s): AMMONIA in the last 168 hours. Coagulation Profile: No results for input(s): INR, PROTIME in the last 168 hours. Cardiac Enzymes: No results for input(s): CKTOTAL, CKMB, CKMBINDEX, TROPONINI in the last 168 hours. BNP (last 3 results) No results for input(s): PROBNP in the last 8760 hours. HbA1C: No results for input(s): HGBA1C in the last 72 hours.  CBG: Recent Labs  Lab 03/16/21 1638 03/16/21 2106 03/17/21 0823 03/17/21 1217 03/17/21 1529  GLUCAP 256* 205* 197* 264* 235*   Lipid Profile: No results for input(s): CHOL, HDL, LDLCALC, TRIG, CHOLHDL, LDLDIRECT in the last 72 hours. Thyroid Function Tests: No results for input(s): TSH, T4TOTAL, FREET4, T3FREE, THYROIDAB in the last 72 hours. Anemia Panel: No results for input(s): VITAMINB12, FOLATE, FERRITIN, TIBC, IRON, RETICCTPCT in the last 72 hours. Sepsis Labs: No results for input(s): PROCALCITON, LATICACIDVEN in the last 168 hours.  Recent Results (from the past 240 hour(s))  Resp Panel by RT-PCR (Flu A&B, Covid) Nasopharyngeal Swab     Status: None   Collection Time: 03/13/21 11:11 PM   Specimen: Nasopharyngeal Swab; Nasopharyngeal(NP) swabs in vial transport medium  Result Value Ref Range Status   SARS Coronavirus 2 by RT PCR NEGATIVE NEGATIVE Final    Comment: (NOTE) SARS-CoV-2 target nucleic acids are NOT DETECTED.  The SARS-CoV-2 RNA is generally detectable in upper respiratory specimens during the acute phase of infection. The lowest concentration of SARS-CoV-2 viral copies this assay can detect is 138 copies/mL. A negative result does not preclude SARS-Cov-2 infection and should not be used as the sole basis for treatment or other patient management decisions. A negative  result may occur with  improper specimen collection/handling, submission of specimen other than nasopharyngeal swab, presence of viral mutation(s) within the areas targeted by this assay, and inadequate number of viral copies(<138 copies/mL). A negative result must be combined with clinical observations, patient history, and epidemiological information. The expected result is Negative.  Fact Sheet for Patients:  EntrepreneurPulse.com.au  Fact Sheet for Healthcare Providers:  IncredibleEmployment.be  This test is no t yet approved or cleared by the [place] FDA and  has been authorized for detection and/or diagnosis of SARS-CoV-2 by FDA under an Emergency Use Authorization (EUA). This EUA will remain  in effect (meaning this test can be used) for the duration of the COVID-19 declaration under Section 564(b)(1) of the Act, 21 U.S.C.section 360bbb-3(b)(1), unless the authorization is terminated  or revoked sooner.       Influenza A by  PCR NEGATIVE NEGATIVE Final   Influenza B by PCR NEGATIVE NEGATIVE Final    Comment: (NOTE) The Xpert Xpress SARS-CoV-2/FLU/RSV plus assay is intended as an aid in the diagnosis of influenza from Nasopharyngeal swab specimens and should not be used as a sole basis for treatment. Nasal washings and aspirates are unacceptable for Xpert Xpress SARS-CoV-2/FLU/RSV testing.  Fact Sheet for Patients: EntrepreneurPulse.com.au  Fact Sheet for Healthcare Providers: IncredibleEmployment.be  This test is not yet approved or cleared by the [place] FDA and has been authorized for detection and/or diagnosis of SARS-CoV-2 by FDA under an Emergency Use Authorization (EUA). This EUA will remain in effect (meaning this test can be used) for the duration of the COVID-19 declaration under Section 564(b)(1) of the Act, 21 U.S.C. section 360bbb-3(b)(1), unless the authorization is  terminated or revoked.  Performed at [facility], [place], [place] [address]   Respiratory (~20 pathogens) panel by PCR     Status: None   Collection Time: 03/15/21  5:05 PM   Specimen: Nasopharyngeal Swab; Respiratory  Result Value Ref Range Status   Adenovirus NOT DETECTED NOT DETECTED Final   Coronavirus 229E NOT DETECTED NOT DETECTED Final    Comment: (NOTE) The Coronavirus on the Respiratory Panel, DOES NOT test for the novel  Coronavirus (2019 nCoV)    Coronavirus HKU1 NOT DETECTED NOT DETECTED Final   Coronavirus NL63 NOT DETECTED NOT DETECTED Final   Coronavirus OC43 NOT DETECTED NOT DETECTED Final   Metapneumovirus NOT DETECTED NOT DETECTED Final   Rhinovirus / Enterovirus NOT DETECTED NOT DETECTED Final   Influenza A NOT DETECTED NOT DETECTED Final   Influenza B NOT DETECTED NOT DETECTED Final   Parainfluenza Virus 1 NOT DETECTED NOT DETECTED Final   Parainfluenza Virus 2 NOT DETECTED NOT DETECTED Final   Parainfluenza Virus 3 NOT DETECTED NOT DETECTED Final   Parainfluenza Virus 4 NOT DETECTED NOT DETECTED Final   Respiratory Syncytial Virus NOT DETECTED NOT DETECTED Final   Bordetella pertussis NOT DETECTED NOT DETECTED Final   Bordetella Parapertussis NOT DETECTED NOT DETECTED Final   Chlamydophila pneumoniae NOT DETECTED NOT DETECTED Final   Mycoplasma pneumoniae NOT DETECTED NOT DETECTED Final    Comment: Performed at [facility], [place]. [address] [place], [place] [address]      Radiology Studies: No results found.   Scheduled Meds:  acetylcysteine  4 mL Nebulization BID   acyclovir  400 mg Oral BID   atenolol  25 mg Oral Daily   atorvastatin  20 mg Oral Daily   buPROPion  150 mg Oral Daily   busPIRone  5 mg Oral BID   cyclobenzaprine  10 mg Oral QHS   enoxaparin (LOVENOX) injection  40 mg Subcutaneous Q24H   guaiFENesin-dextromethorphan  10 mL Oral TID   hydrochlorothiazide  25 mg Oral Daily   insulin aspart  0-20  Units Subcutaneous TID WC   insulin aspart  4 Units Subcutaneous TID WC   ipratropium-albuterol  3 mL Nebulization Q6H   levothyroxine  25 mcg Oral Q0600   montelukast  10 mg Oral QHS   nystatin  5 mL Oral QID   olopatadine  1 drop Both Eyes BID   pantoprazole  40 mg Oral Daily   predniSONE  40 mg Oral Q breakfast   pregabalin  150 mg Oral QHS   QUEtiapine  50 mg Oral QHS   roflumilast  500 mcg Oral Daily   traZODone  100 mg Oral QHS  varenicline  1 mg Oral BID   venlafaxine XR  150 mg Oral Q breakfast   Continuous Infusions:   LOS: 3 days     Enzo Bi, MD [facility] If 7PM-7AM, please contact night-coverage 03/17/2021, 4:12 PM

## 2021-03-17 NOTE — Plan of Care (Signed)

## 2021-03-18 LAB — CBC
HCT: 38.4 % (ref 36.0–46.0)
Hemoglobin: 12.5 g/dL (ref 12.0–15.0)
MCH: 29.1 pg (ref 26.0–34.0)
MCHC: 32.6 g/dL (ref 30.0–36.0)
MCV: 89.3 fL (ref 80.0–100.0)
Platelets: 308 10*3/uL (ref 150–400)
RBC: 4.3 MIL/uL (ref 3.87–5.11)
RDW: 14.8 % (ref 11.5–15.5)
WBC: 13.2 10*3/uL — ABNORMAL HIGH (ref 4.0–10.5)
nRBC: 0 % (ref 0.0–0.2)

## 2021-03-18 LAB — BASIC METABOLIC PANEL
Anion gap: 12 (ref 5–15)
BUN: 29 mg/dL — ABNORMAL HIGH (ref 6–20)
CO2: 29 mmol/L (ref 22–32)
Calcium: 9.4 mg/dL (ref 8.9–10.3)
Chloride: 94 mmol/L — ABNORMAL LOW (ref 98–111)
Creatinine, Ser: 0.87 mg/dL (ref 0.44–1.00)
GFR, Estimated: >60 mL/min (ref >60)
Glucose, Bld: 157 mg/dL — ABNORMAL HIGH (ref 70–99)
Potassium: 3.3 mmol/L — ABNORMAL LOW (ref 3.5–5.1)
Sodium: 135 mmol/L (ref 135–145)

## 2021-03-18 LAB — GLUCOSE, CAPILLARY
Glucose-Capillary: 142 mg/dL — ABNORMAL HIGH (ref 70–99)
Glucose-Capillary: 397 mg/dL — ABNORMAL HIGH (ref 70–99)
Glucose-Capillary: 287 mg/dL — ABNORMAL HIGH (ref 70–99)
Glucose-Capillary: 224 mg/dL — ABNORMAL HIGH (ref 70–99)

## 2021-03-18 LAB — MAGNESIUM: Magnesium: 1.6 mg/dL — ABNORMAL LOW (ref 1.7–2.4)

## 2021-03-18 MED ORDER — VARENICLINE TARTRATE 1 MG PO TABS: 1.0000 mg | ORAL_TABLET | Freq: Two times a day (BID) | ORAL | 0 refills | Status: AC

## 2021-03-18 MED ORDER — INSULIN ASPART 100 UNIT/ML IJ SOLN
0.0000 [IU] | Freq: Every day | INTRAMUSCULAR | Status: DC
  Administered 2021-03-18: 2 [IU] via SUBCUTANEOUS
  Filled 2021-03-18 (×2): qty 1

## 2021-03-18 MED ORDER — PREDNISONE 10 MG PO TABS: ORAL_TABLET | ORAL | 0 refills | Status: DC

## 2021-03-18 MED ORDER — TRAZODONE HCL 100 MG PO TABS
100.0000 mg | ORAL_TABLET | Freq: Every day | ORAL | Status: DC
  Administered 2021-03-18: 100 mg via ORAL
  Filled 2021-03-18: qty 1

## 2021-03-18 MED ORDER — INSULIN ASPART 100 UNIT/ML IJ SOLN: 0.0000 [IU] | Freq: Three times a day (TID) | INTRAMUSCULAR | Status: DC

## 2021-03-18 MED ORDER — MAGNESIUM SULFATE 2 GM/50ML IV SOLN
2.0000 g | Freq: Once | INTRAVENOUS | Status: AC
Start: 2021-03-18 — End: 2021-03-18
  Administered 2021-03-18: 2 g via INTRAVENOUS
  Filled 2021-03-18: qty 50

## 2021-03-18 MED ORDER — POTASSIUM CHLORIDE 20 MEQ PO PACK
40.0000 meq | PACK | Freq: Once | ORAL | Status: AC
Start: 2021-03-18 — End: 2021-03-18
  Administered 2021-03-18: 40 meq via ORAL
  Filled 2021-03-18: qty 2

## 2021-03-18 MED ORDER — PREDNISONE 20 MG PO TABS
20.0000 mg | ORAL_TABLET | Freq: Every day | ORAL | Status: DC
  Filled 2021-03-18: qty 1

## 2021-03-18 MED ORDER — GUAIFENESIN-DM 100-10 MG/5ML PO SYRP: 10.0000 mL | ORAL_SOLUTION | Freq: Three times a day (TID) | ORAL | 0 refills | Status: AC

## 2021-03-18 MED ORDER — HYDROCOD POLI-CHLORPHE POLI ER 10-8 MG/5ML PO SUER
5.0000 mL | Freq: Two times a day (BID) | ORAL | 0 refills | Status: AC | PRN
Start: 2021-03-18 — End: 2021-03-25

## 2021-03-18 NOTE — TOC Progression Note (Signed)
Transition of Care [facility]) - Progression Note    Patient Details  Name: Deaja Rizo MRN: [id number] Date of Birth: [birth year]  Transition of Care [facility]) CM/SW Contact  Boris Sharper, LCSW Phone Number: 03/18/2021, 4:15 PM  Clinical Narrative:    CSW spoke with pt regarding PT evaluation and she has decided to go home with [facility], [place] notified MD. Toc please arrange [facility] upon discharge        Expected Discharge Plan and Services           Expected Discharge Date: 03/18/21                                     Social Determinants of Health ([place]) Interventions    Readmission Risk Interventions Readmission Risk Prevention Plan 04/07/2019  Transportation Screening Complete  PCP or Specialist Appt within 3-5 Days Complete  HRI or [place] Complete  Social Work Consult for [place] Planning/Counseling Complete  Palliative Care Screening Not Applicable  Medication Review Press photographer) Complete  Some recent data might be hidden

## 2021-03-18 NOTE — Progress Notes (Signed)
PROGRESS NOTE    Joan [name]  JGG:[id number] DOB: 1960/11/29 DOA: 03/13/2021 PCP: [name], MD  [id number]/[id number]-AA   Assessment & Plan:   Active Problems:   COPD exacerbation ([place])   Joan [name] is a 61 y.o. Caucasian female with medical history significant for anxiety, bipolar disorder, asthma, COPD, depression, type 2 diabetes mellitus with diabetic neuropathy, hypothyroidism and GERD, presented to the ER with acute onset of worsening dyspnea with associated productive cough of yellowish, occasionally brownish and clear sputum and wheezing which have been going on over the last couple of weeks and got significantly worse for the last couple of days.  She is on home O2 at 3 L/min at baseline.  She has been using her nebulizer at home.  No fever or chills.  No chest pain or palpitations.  No hemoptysis.  No nausea or vomiting or abdominal pain.  No dysuria, oliguria or hematuria or flank pain.  She has been vaccinated for COVID-19 but never had booster.  She continues to smoke half a pack of cigarettes per day.  1.  COPD and asthma acute exacerbation likely secondary to acute bronchitis  Chronic respiratory failure on home 3L O2. --started on IV solumedrol and scheduled nebs on admission. --on home 3L O2. Plan: --taper prednisone to 20 mg daily --cont scheduled Nebs --flutter valve --Mucomyst neb BID --cont Robitussin as scheduled  2.  Hypokalemia. --monitor and replete PRN  3.  Hypothyroidism. --cont home Synthroid  4.  Type 2 diabetes mellitus, well controlled with peripheral neuropathy. Hyperglycemia exacerbated by steroid use --ACHS SSI --mealtime 4u TID --cont home Lyrica  5.  Essential hypertension. --cont atenolol and HCTZ  6.  Dyslipidemia. --cont statin  7.  Ongoing tobacco abuse. - smoking cessation advised. --continue Chantix (resumed during this hospitalization)  Anxiety and depression --cont home psych meds --avoid extra benzos  since pt has been very sleepy at times.    DVT prophylaxis: Lovenox SQ Code Status: Full code  Family Communication:  Level of care: Med-Surg Dispo:   The patient is from: home Anticipated d/c is to: home (pt declined SNF) Anticipated d/c date is: tomorrow   Subjective and Interval History:  Pt was very sleepy this morning.  PT eval found pt to be weak, unsteady on her feet and unsafe to go home.   Objective: Vitals:   03/18/21 0350 03/18/21 0833 03/18/21 1206 03/18/21 1621  BP: 117/67 126/69 (!) 142/90 108/70  Pulse: 70 68 74 74  Resp: 18 18 18 18   Temp: 97.6 F (36.4 C) (!) 97.5 F (36.4 C) (!) 97.5 F (36.4 C) (!) 97.5 F (36.4 C)  TempSrc:      SpO2: 97% 96% 97% 99%  Weight:      Height:       No intake or output data in the 24 hours ending 03/18/21 1722  Filed Weights   03/13/21 2243  Weight: 90.3 kg    Examination:   Constitutional: NAD, sleepy HEENT: conjunctivae and lids normal, EOMI CV: No cyanosis.   RESP: normal respiratory effort, on 3L   Data Reviewed: I have personally reviewed following labs and imaging studies  CBC: Recent Labs  Lab 03/13/21 2251 03/14/21 0633 03/15/21 0603 03/16/21 0612 03/17/21 0537 03/18/21 0432  WBC 11.4* 7.2 14.2* 11.0* 11.7* 13.2*  NEUTROABS 6.9  --   --   --   --   --   HGB 13.4 13.0 11.0* 12.1 12.7 12.5  HCT 39.7 39.4 33.4* 36.7 40.1  38.4  MCV 89.0 89.5 89.1 90.0 90.9 89.3  PLT 392 271 282 273 319 502   Basic Metabolic Panel: Recent Labs  Lab 03/14/21 0633 03/15/21 0603 03/16/21 0612 03/17/21 0537 03/18/21 0432  NA 134* 136 137 138 135  K 3.9 3.4* 3.4* 3.6 3.3*  CL 94* 100 99 95* 94*  CO2 30 27 30 31 29   GLUCOSE 359* 220* 210* 180* 157*  BUN 13 16 21* 26* 29*  CREATININE 1.11* 0.88 0.97 0.84 0.87  CALCIUM 9.1 8.9 9.3 9.7 9.4  MG  --  1.7 1.7 2.3 1.6*   GFR: Estimated Creatinine Clearance: 81.9 mL/min (by C-G formula based on SCr of 0.87 mg/dL). Liver Function Tests: Recent Labs  Lab  03/13/21 2344  AST 25  ALT 17  ALKPHOS 106  BILITOT 0.8  PROT 6.6  ALBUMIN 3.5   No results for input(s): LIPASE, AMYLASE in the last 168 hours. No results for input(s): AMMONIA in the last 168 hours. Coagulation Profile: No results for input(s): INR, PROTIME in the last 168 hours. Cardiac Enzymes: No results for input(s): CKTOTAL, CKMB, CKMBINDEX, TROPONINI in the last 168 hours. BNP (last 3 results) No results for input(s): PROBNP in the last 8760 hours. HbA1C: No results for input(s): HGBA1C in the last 72 hours.  CBG: Recent Labs  Lab 03/17/21 1529 03/17/21 2229 03/18/21 0834 03/18/21 1128 03/18/21 1617  GLUCAP 235* 288* 142* 397* 287*   Lipid Profile: No results for input(s): CHOL, HDL, LDLCALC, TRIG, CHOLHDL, LDLDIRECT in the last 72 hours. Thyroid Function Tests: No results for input(s): TSH, T4TOTAL, FREET4, T3FREE, THYROIDAB in the last 72 hours. Anemia Panel: No results for input(s): VITAMINB12, FOLATE, FERRITIN, TIBC, IRON, RETICCTPCT in the last 72 hours. Sepsis Labs: No results for input(s): PROCALCITON, LATICACIDVEN in the last 168 hours.  Recent Results (from the past 240 hour(s))  Resp Panel by RT-PCR (Flu A&B, Covid) Nasopharyngeal Swab     Status: None   Collection Time: 03/13/21 11:11 PM   Specimen: Nasopharyngeal Swab; Nasopharyngeal(NP) swabs in vial transport medium  Result Value Ref Range Status   SARS Coronavirus 2 by RT PCR NEGATIVE NEGATIVE Final    Comment: (NOTE) SARS-CoV-2 target nucleic acids are NOT DETECTED.  The SARS-CoV-2 RNA is generally detectable in upper respiratory specimens during the acute phase of infection. The lowest concentration of SARS-CoV-2 viral copies this assay can detect is 138 copies/mL. A negative result does not preclude SARS-Cov-2 infection and should not be used as the sole basis for treatment or other patient management decisions. A negative result may occur with  improper specimen collection/handling,  submission of specimen other than nasopharyngeal swab, presence of viral mutation(s) within the areas targeted by this assay, and inadequate number of viral copies(<138 copies/mL). A negative result must be combined with clinical observations, patient history, and epidemiological information. The expected result is Negative.  Fact Sheet for Patients:  EntrepreneurPulse.com.au  Fact Sheet for Healthcare Providers:  IncredibleEmployment.be  This test is no t yet approved or cleared by the [place] FDA and  has been authorized for detection and/or diagnosis of SARS-CoV-2 by FDA under an Emergency Use Authorization (EUA). This EUA will remain  in effect (meaning this test can be used) for the duration of the COVID-19 declaration under Section 564(b)(1) of the Act, 21 U.S.C.section 360bbb-3(b)(1), unless the authorization is terminated  or revoked sooner.       Influenza A by PCR NEGATIVE NEGATIVE Final   Influenza B by PCR NEGATIVE NEGATIVE  Final    Comment: (NOTE) The Xpert Xpress SARS-CoV-2/FLU/RSV plus assay is intended as an aid in the diagnosis of influenza from Nasopharyngeal swab specimens and should not be used as a sole basis for treatment. Nasal washings and aspirates are unacceptable for Xpert Xpress SARS-CoV-2/FLU/RSV testing.  Fact Sheet for Patients: EntrepreneurPulse.com.au  Fact Sheet for Healthcare Providers: IncredibleEmployment.be  This test is not yet approved or cleared by the [place] FDA and has been authorized for detection and/or diagnosis of SARS-CoV-2 by FDA under an Emergency Use Authorization (EUA). This EUA will remain in effect (meaning this test can be used) for the duration of the COVID-19 declaration under Section 564(b)(1) of the Act, 21 U.S.C. section 360bbb-3(b)(1), unless the authorization is terminated or revoked.  Performed at [name], [place], [place] [address]   Respiratory (~20 pathogens) panel by PCR     Status: None   Collection Time: 03/15/21  5:05 PM   Specimen: Nasopharyngeal Swab; Respiratory  Result Value Ref Range Status   Adenovirus NOT DETECTED NOT DETECTED Final   Coronavirus 229E NOT DETECTED NOT DETECTED Final    Comment: (NOTE) The Coronavirus on the Respiratory Panel, DOES NOT test for the novel  Coronavirus (2019 nCoV)    Coronavirus HKU1 NOT DETECTED NOT DETECTED Final   Coronavirus NL63 NOT DETECTED NOT DETECTED Final   Coronavirus OC43 NOT DETECTED NOT DETECTED Final   Metapneumovirus NOT DETECTED NOT DETECTED Final   Rhinovirus / Enterovirus NOT DETECTED NOT DETECTED Final   Influenza A NOT DETECTED NOT DETECTED Final   Influenza B NOT DETECTED NOT DETECTED Final   Parainfluenza Virus 1 NOT DETECTED NOT DETECTED Final   Parainfluenza Virus 2 NOT DETECTED NOT DETECTED Final   Parainfluenza Virus 3 NOT DETECTED NOT DETECTED Final   Parainfluenza Virus 4 NOT DETECTED NOT DETECTED Final   Respiratory Syncytial Virus NOT DETECTED NOT DETECTED Final   Bordetella pertussis NOT DETECTED NOT DETECTED Final   Bordetella Parapertussis NOT DETECTED NOT DETECTED Final   Chlamydophila pneumoniae NOT DETECTED NOT DETECTED Final   Mycoplasma pneumoniae NOT DETECTED NOT DETECTED Final    Comment: Performed at [facility], [place]. [address] [place], [place] [address]      Radiology Studies: No results found.   Scheduled Meds:  acetylcysteine  4 mL Nebulization BID   acyclovir  400 mg Oral BID   atenolol  25 mg Oral Daily   atorvastatin  20 mg Oral Daily   buPROPion  150 mg Oral Daily   busPIRone  5 mg Oral BID   enoxaparin (LOVENOX) injection  40 mg Subcutaneous Q24H   guaiFENesin-dextromethorphan  10 mL Oral TID   hydrochlorothiazide  25 mg Oral Daily   insulin aspart  0-20 Units Subcutaneous TID WC   insulin aspart  4 Units Subcutaneous TID WC   ipratropium-albuterol  3 mL  Nebulization Q6H   levothyroxine  25 mcg Oral Q0600   montelukast  10 mg Oral QHS   nystatin  5 mL Oral QID   olopatadine  1 drop Both Eyes BID   pantoprazole  40 mg Oral Daily   pregabalin  150 mg Oral QHS   QUEtiapine  50 mg Oral QHS   roflumilast  500 mcg Oral Daily   traZODone  100 mg Oral QHS   varenicline  1 mg Oral BID   venlafaxine XR  150 mg Oral Q breakfast   Continuous Infusions:   LOS: 4 days  Enzo Bi, MD [facility] If 7PM-7AM, please contact night-coverage 03/18/2021, 5:22 PM

## 2021-03-18 NOTE — Evaluation (Signed)
Physical Therapy Evaluation Patient Details Name: Joan [name] MRN: [id number] DOB: 28-Nov-1960 Today's Date: 03/18/2021  History of Present Illness  Joan [name] is a 61 y.o. Caucasian female with medical history significant for anxiety, bipolar disorder, asthma, COPD, depression, type 2 diabetes mellitus with diabetic neuropathy, hypothyroidism and GERD, presented to the ER with acute onset of worsening dyspnea with associated productive cough of yellowish, occasionally brownish and clear sputum and wheezing which have been going on over the last couple of weeks and got significantly worse for the last couple of days.  She is on home O2 at 3 L/min at baseline.  She has been using her nebulizer at home.  No fever or chills.  No chest pain or palpitations.  No hemoptysis.  No nausea or vomiting or abdominal pain.  No dysuria, oliguria or hematuria or flank pain.  She continues to smoke half a pack of cigarettes per day.  Clinical Impression  The pt presents this session fearful of discharge home. She states that she has been increasingly fatigued with activity and has not yet mobilized independently. This session the pt  ambulated at a pace of 0.64ft/s which puts her at increased risk for falls. At this time she is not demonstrating safety to return home independently. PT recommends skilled rehabilitation (SNF) in order to optimize functional mobility and independence.      Recommendations for follow up therapy are one component of a multi-disciplinary discharge planning process, led by the attending physician.  Recommendations may be updated based on patient status, additional functional criteria and insurance authorization.  Follow Up Recommendations Skilled nursing-short term rehab (<3 hours/day)    Assistance Recommended at Discharge Frequent or constant Supervision/Assistance  Patient can return home with the following  A little help with walking and/or transfers;A little help with  bathing/dressing/bathroom;Assistance with cooking/housework;Help with stairs or ramp for entrance;Assist for transportation;Direct supervision/assist for medications management    Equipment Recommendations None recommended by PT  Recommendations for Other Services       Functional Status Assessment Patient has had a recent decline in their functional status and demonstrates the ability to make significant improvements in function in a reasonable and predictable amount of time.     Precautions / Restrictions Precautions Precautions: Fall      Mobility  Bed Mobility Overal bed mobility: Needs Assistance Bed Mobility: Supine to Sit, Sit to Supine     Supine to sit: HOB elevated, Min guard Sit to supine: HOB elevated, Min guard   General bed mobility comments: Pt stating that she has a queen bed at home, educated on need to simulate home environment for OOB mobility, pt insisting on utilizing bed features to achieve EOB.    Transfers Overall transfer level: Needs assistance Equipment used: Rolling walker (2 wheels) Transfers: Sit to/from Stand Sit to Stand: Min guard                Ambulation/Gait Ambulation/Gait assistance: Min assist, Mod assist Gait Distance (Feet): 50 Feet Assistive device: Rolling walker (2 wheels) Gait Pattern/deviations: Step-to pattern, Knee flexed in stance - left, Knee flexed in stance - right   Gait velocity interpretation: <1.31 ft/sec, indicative of household Teacher, early years/pre Rankin (Stroke Patients Only)       Balance Overall balance assessment: Needs assistance Sitting-balance support: Feet supported Sitting balance-Leahy Scale: Good     Standing balance  support: During functional activity, Bilateral upper extremity supported Standing balance-Leahy Scale: Fair                               Pertinent Vitals/Pain Pain Assessment Pain Assessment: No/denies  pain    Home Living Family/patient expects to be discharged to:: Private residence Living Arrangements: Alone   Type of Home: House Home Access: Level entry       Home Layout: One level Home Equipment: Rollator (4 wheels);Cane - single point;Shower seat;Wheelchair - manual;BSC/3in1 Additional Comments: Home O2    Prior Function Prior Level of Function : Independent/Modified Independent               ADLs Comments: Pt stating that she completed all ADLs and iADLs independently     Hand Dominance   Dominant Hand: Right    Extremity/Trunk Assessment   Upper Extremity Assessment Upper Extremity Assessment: Overall WFL for tasks assessed    Lower Extremity Assessment Lower Extremity Assessment: Generalized weakness;RLE deficits/detail;LLE deficits/detail RLE Deficits / Details: Inability to hold seated hip flexion with resistive force LLE Deficits / Details: Inability to hold seated hip flexion with resistive force       Communication   Communication: No difficulties  Cognition Arousal/Alertness: Lethargic, Suspect due to medications Behavior During Therapy: WFL for tasks assessed/performed Overall Cognitive Status: Within Functional Limits for tasks assessed                                          General Comments      Exercises     Assessment/Plan    PT Assessment Patient needs continued PT services  PT Problem List Decreased strength;Decreased mobility;Decreased activity tolerance;Decreased balance       PT Treatment Interventions Gait training;Therapeutic activities;Therapeutic exercise;Functional mobility training;Balance training    PT Goals (Current goals can be found in the Care Plan section)  Acute Rehab PT Goals Patient Stated Goal: to live independently PT Goal Formulation: With patient Time For Goal Achievement: 04/01/21 Potential to Achieve Goals: Good    Frequency Min 2X/week     Co-evaluation                AM-PAC PT "6 Clicks" Mobility  Outcome Measure Help needed turning from your back to your side while in a flat bed without using bedrails?: A Little Help needed moving from lying on your back to sitting on the side of a flat bed without using bedrails?: A Little Help needed moving to and from a bed to a chair (including a wheelchair)?: A Lot Help needed standing up from a chair using your arms (e.g., wheelchair or bedside chair)?: A Little Help needed to walk in hospital room?: A Lot Help needed climbing 3-5 steps with a railing? : A Lot 6 Click Score: 15    End of Session Equipment Utilized During Treatment: Gait belt;Oxygen Activity Tolerance: Patient limited by fatigue;Patient limited by lethargy Patient left: in bed;with call bell/phone within reach;with bed alarm set Nurse Communication: Mobility status PT Visit Diagnosis: Unsteadiness on feet (R26.81);Muscle weakness (generalized) (M62.81);Difficulty in walking, not elsewhere classified (R26.2)    Time: 2355-7322 PT Time Calculation (min) (ACUTE ONLY): 35 min   Charges:   PT Evaluation $PT Eval Moderate Complexity: 1 Mod PT Treatments $Gait Training: 23-37 mins        1:00 PM,  03/18/21 Ramond Darnell A. Mordecai Maes PT, DPT Physical Therapist - [facility] [address] 03/18/2021, 12:59 PM

## 2021-03-18 NOTE — Plan of Care (Signed)

## 2021-03-18 NOTE — Progress Notes (Signed)
Physical Therapy Treatment Patient Details Name: Joan [name] MRN: [id number] DOB: 1960-06-17 Today's Date: 03/18/2021   History of Present Illness Joan [name] is Joan 61 y.o. Caucasian female with medical history significant for anxiety, bipolar disorder, asthma, COPD, depression, type 2 diabetes mellitus with diabetic neuropathy, hypothyroidism and GERD, presented to the ER with acute onset of worsening dyspnea with associated productive cough of yellowish, occasionally brownish and clear sputum and wheezing which have been going on over the last couple of weeks and got significantly worse for the last couple of days.  She is on home O2 at 3 L/min at baseline.  She has been using her nebulizer at home.  No fever or chills.  No chest pain or palpitations.  No hemoptysis.  No nausea or vomiting or abdominal pain.  No dysuria, oliguria or hematuria or flank pain.  She continues to smoke half Joan pack of cigarettes per day.    PT Comments    Pt demonstrating improved level of arousal. She was able to improve gait speed and distance this session compared to the previous session, however continues to present with Joan gait speed that puts her at increased risk of falls. The pt demonstrates some poor insight into her deficits during gait and has bilateral knee buckling. She requires verbal cues for safety and is not demonstrating ability to return home independently at this time. PT will continue to follow.    Recommendations for follow up therapy are one component of Joan multi-disciplinary discharge planning process, led by the attending physician.  Recommendations may be updated based on patient status, additional functional criteria and insurance authorization.  Follow Up Recommendations  Skilled nursing-short term rehab (<3 hours/day)     Assistance Recommended at Discharge Frequent or constant Supervision/Assistance  Patient can return home with the following Joan little help with walking and/or  transfers;Joan little help with bathing/dressing/bathroom;Assistance with cooking/housework;Help with stairs or ramp for entrance;Assist for transportation;Direct supervision/assist for medications management   Equipment Recommendations  None recommended by PT    Recommendations for Other Services       Precautions / Restrictions Precautions Precautions: Fall     Mobility  Bed Mobility Overal bed mobility: Needs Assistance Bed Mobility: Supine to Sit, Sit to Supine     Supine to sit: HOB elevated, Min guard Sit to supine: HOB elevated, Min guard   General bed mobility comments: Pt stating that she has Joan queen bed at home, educated on need to simulate home environment for OOB mobility, pt insisting on utilizing bed features to achieve EOB.    Transfers Overall transfer level: Needs assistance Equipment used: Rolling walker (2 wheels) Transfers: Sit to/from Stand Sit to Stand: Supervision                Ambulation/Gait Ambulation/Gait assistance: Min assist Gait Distance (Feet): 50 Feet (50'x2) Assistive device: Rolling walker (2 wheels) Gait Pattern/deviations: Knees buckling, Step-to pattern Gait velocity: 0.08 Gait velocity interpretation: <1.31 ft/sec, indicative of household Biomedical scientist Rankin (Stroke Patients Only)       Balance Overall balance assessment: Needs assistance Sitting-balance support: Feet supported Sitting balance-Leahy Scale: Good     Standing balance support: During functional activity, Bilateral upper extremity supported Standing balance-Leahy Scale: Fair  Cognition Arousal/Alertness: Awake/alert Behavior During Therapy: WFL for tasks assessed/performed Overall Cognitive Status: Within Functional Limits for tasks assessed                                 General Comments: Approved level of arousal         Exercises      General Comments        Pertinent Vitals/Pain Pain Assessment Pain Assessment: No/denies pain    Home Living Family/patient expects to be discharged to:: Private residence Living Arrangements: Alone   Type of Home: House Home Access: Level entry       Home Layout: One level Home Equipment: Rollator (4 wheels);Cane - single point;Shower seat;Wheelchair - manual;BSC/3in1 Additional Comments: Home O2    Prior Function            PT Goals (current goals can now be found in the care plan section) Acute Rehab PT Goals Patient Stated Goal: to live independently PT Goal Formulation: With patient Time For Goal Achievement: 04/01/21 Potential to Achieve Goals: Good Progress towards PT goals: Progressing toward goals    Frequency    Min 2X/week      PT Plan Current plan remains appropriate    Co-evaluation              AM-PAC PT "6 Clicks" Mobility   Outcome Measure  Help needed turning from your back to your side while in Joan flat bed without using bedrails?: Joan Little Help needed moving from lying on your back to sitting on the side of Joan flat bed without using bedrails?: Joan Little Help needed moving to and from Joan bed to Joan chair (including Joan wheelchair)?: Joan Little Help needed standing up from Joan chair using your arms (e.g., wheelchair or bedside chair)?: Joan Little Help needed to walk in hospital room?: Joan Lot Help needed climbing 3-5 steps with Joan railing? : Joan Lot 6 Click Score: 16    End of Session Equipment Utilized During Treatment: Gait belt;Oxygen Activity Tolerance: Patient limited by fatigue Patient left: in bed;with call bell/phone within reach;with bed alarm set Nurse Communication: Mobility status PT Visit Diagnosis: Unsteadiness on feet (R26.81);Muscle weakness (generalized) (M62.81);Difficulty in walking, not elsewhere classified (R26.2)     Time: 1350-1409 PT Time Calculation (min) (ACUTE ONLY): 19 min  Charges:  $Gait Training:  8-22 mins                     2:25 PM, 03/18/21 Joan [name] Joan. Joan [name] PT, DPT Physical Therapist - [facility] Joan [name] 03/18/2021, 2:23 PM

## 2021-03-19 LAB — GLUCOSE, CAPILLARY
Glucose-Capillary: 190 mg/dL — ABNORMAL HIGH (ref 70–99)
Glucose-Capillary: 196 mg/dL — ABNORMAL HIGH (ref 70–99)
Glucose-Capillary: 193 mg/dL — ABNORMAL HIGH (ref 70–99)

## 2021-03-19 LAB — CBC
HCT: 39.4 % (ref 36.0–46.0)
Hemoglobin: 12.7 g/dL (ref 12.0–15.0)
MCH: 29.5 pg (ref 26.0–34.0)
MCHC: 32.2 g/dL (ref 30.0–36.0)
MCV: 91.6 fL (ref 80.0–100.0)
Platelets: 325 10*3/uL (ref 150–400)
RBC: 4.3 MIL/uL (ref 3.87–5.11)
RDW: 15 % (ref 11.5–15.5)
WBC: 14 10*3/uL — ABNORMAL HIGH (ref 4.0–10.5)
nRBC: 0 % (ref 0.0–0.2)

## 2021-03-19 LAB — BASIC METABOLIC PANEL
Anion gap: 9 (ref 5–15)
BUN: 30 mg/dL — ABNORMAL HIGH (ref 6–20)
CO2: 33 mmol/L — ABNORMAL HIGH (ref 22–32)
Calcium: 9.2 mg/dL (ref 8.9–10.3)
Chloride: 95 mmol/L — ABNORMAL LOW (ref 98–111)
Creatinine, Ser: 1.01 mg/dL — ABNORMAL HIGH (ref 0.44–1.00)
GFR, Estimated: >60 mL/min (ref >60)
Glucose, Bld: 183 mg/dL — ABNORMAL HIGH (ref 70–99)
Potassium: 4 mmol/L (ref 3.5–5.1)
Sodium: 137 mmol/L (ref 135–145)

## 2021-03-19 LAB — MAGNESIUM: Magnesium: 2 mg/dL (ref 1.7–2.4)

## 2021-03-19 MED ORDER — METHYLPREDNISOLONE 4 MG PO TBPK: ORAL_TABLET | ORAL | 0 refills | Status: DC

## 2021-03-19 NOTE — Care Management Important Message (Signed)
Important Message  Patient Details  Name: Joan [name] MRN: [id number] Date of Birth: 09-24-1960   Medicare Important Message Given:  Yes     Juliann Pulse A Naydene Kamrowski 03/19/2021, 11:20 AM

## 2021-03-19 NOTE — Discharge Summary (Signed)
Physician Discharge Summary   Joan [name]  female DOB: [birth year]  GUR:[id number]  PCP: [name], MD  Admit date: 03/13/2021 Discharge date: 03/19/2021  Admitted From: home Disposition:  home.  Pt declined SNF rehab. Home Health: Yes CODE STATUS: Full code  Discharge Instructions     Discharge instructions   Complete by: As directed    You have been treated for COPD flare up.  Please finish steroid taper at home with Medro dosepack.  Please stop smoking.  I have prescribed you Chantix to help you stop smoking.  You have declined SNF rehab, so we have ordered you home health PT, OT and nurse.   Dr. Enzo Bi [facility] Course:  For full details, please see H&P, progress notes, consult notes and ancillary notes.  Briefly,  [name] is a 61 y.o. Caucasian female with medical history significant for anxiety, bipolar disorder, asthma, COPD on 3L home O2, depression, type 2 diabetes mellitus with diabetic neuropathy, hypothyroidism, presented to the ER with acute onset of worsening dyspnea with associated productive cough which have been going on over the last couple of weeks and got significantly worse for the last couple of days.    She continues to smoke half a pack of cigarettes per day.   COPD and asthma acute exacerbation likely secondary to acute bronchitis  Chronic respiratory failure on home 3L O2. --started on IV solumedrol and scheduled nebs on admission.  Also added Mucomyst BID to help mucus clearance. --Maintained O2 sats on home 3L O2. --received prednisone 40 mg daily during hospitalization and discharged on Medrol dosepack, per pt request. --Pt received scheduled Robitussin for cough which was the main reason for triggering her respiratory distress.   --Pt was found to remove her supplemental oxygen a lot of the times.  Respiratory status improved prior to discharge.   Discharged back on home bronchodilator regimen as  below.  Hypokalemia. --monitored and repleted PRN  Hypothyroidism. --cont home Synthroid  Type 2 diabetes mellitus, well controlled with peripheral neuropathy. Hyperglycemia exacerbated by steroid use --A1c 6.8.  No hypoglycemic agent on home med list. --received ACHS SSI and mealtime 4u TID during hospitalization for hyperglycemia likely caused by steroid. --cont home Lyrica  Essential hypertension. --cont atenolol and HCTZ  Dyslipidemia. --cont statin  Ongoing tobacco abuse. - smoking cessation advised. --continue Chantix (resumed during this hospitalization)   Anxiety and depression --cont home psych meds as below   Discharge Diagnoses:  Active Problems:   COPD exacerbation ([place])   30 Day Unplanned Readmission Risk Score    Flowsheet Row ED to Hosp-Admission (Current) from 03/13/2021 in [place] (1A)  30 Day Unplanned Readmission Risk Score (%) 32.63 Filed at 03/19/2021 0801       This score is the patient's risk of an unplanned readmission within 30 days of being discharged (0 -100%). The score is based on dignosis, age, lab data, medications, orders, and past utilization.   Low:  0-14.9   Medium: 15-21.9   High: 22-29.9   Extreme: 30 and above         Discharge Instructions:  Allergies as of 03/19/2021       Reactions   Keflex [cephalexin] Rash, Other (See Comments)   Has tolerated amoxicillin since had keflex   Prednisone Other (See Comments)   "counter reacts"   Shellfish Allergy Shortness Of Breath, Nausea And Vomiting, Other (See Comments)   Stomach  cramps   Tetracyclines & Related Anaphylaxis, Swelling, Other (See Comments)   Throat swelling requiring hospitalization   Dilaudid [hydromorphone Hcl] Other (See Comments)   "Lethargy"   Incruse Ellipta [umeclidinium Bromide] Nausea Only   Other    Tuberculin Tests Other (See Comments)   False postive        Medication List     STOP taking these  medications    bisoprolol 5 MG tablet Commonly known as: ZEBETA   mupirocin cream 2 % Commonly known as: BACTROBAN   nystatin 100000 UNIT/ML suspension Commonly known as: MYCOSTATIN   varenicline 0.5 MG X 11 & 1 MG X 42 tablet Commonly known as: CHANTIX PAK Replaced by: varenicline 1 MG tablet       TAKE these medications    acyclovir 400 MG tablet Commonly known as: ZOVIRAX TAKE 1 TABLET BY MOUTH  TWICE DAILY   albuterol (2.5 MG/3ML) 0.083% nebulizer solution Commonly known as: PROVENTIL Take 3 mLs (2.5 mg total) by nebulization 2 (two) times daily. And every 6 hours as needed.  DX: J44.9   atenolol 25 MG tablet Commonly known as: TENORMIN Take 25 mg by mouth daily.   atorvastatin 20 MG tablet Commonly known as: LIPITOR TAKE 1 TABLET BY MOUTH  DAILY   Breztri Aerosphere 160-9-4.8 MCG/ACT Aero Generic drug: Budeson-Glycopyrrol-Formoterol Inhale 2 puffs into the lungs in the morning and at bedtime. What changed: Another medication with the same name was removed. Continue taking this medication, and follow the directions you see here.   buPROPion 150 MG 24 hr tablet Commonly known as: WELLBUTRIN XL TAKE 1 TABLET BY MOUTH  DAILY   busPIRone 5 MG tablet Commonly known as: BUSPAR TAKE 1/2 TO 1 TABLET BY MOUTH  TWICE DAILY AS NEEDED   Camphor-Menthol-Methyl Sal 3.03-02-08 % Ptch Apply 1 patch topically daily as needed (pain).   chlorpheniramine-HYDROcodone 10-8 MG/5ML Take 5 mLs by mouth every 12 (twelve) hours as needed for up to 7 days for cough.   Combivent Respimat 20-100 MCG/ACT Aers respimat Generic drug: Ipratropium-Albuterol Inhale 1 puff into the lungs every 6 (six) hours as needed for wheezing or shortness of breath.   cyclobenzaprine 10 MG tablet Commonly known as: FLEXERIL Take 10 mg by mouth at bedtime.   Daliresp 500 MCG Tabs tablet Generic drug: roflumilast Take 1 tablet (500 mcg total) by mouth daily.   guaiFENesin-dextromethorphan 100-10  MG/5ML syrup Commonly known as: ROBITUSSIN DM Take 10 mLs by mouth 3 (three) times daily for 7 days.   hydrochlorothiazide 25 MG tablet Commonly known as: HYDRODIURIL TAKE 1 TABLET BY MOUTH  DAILY   levothyroxine 25 MCG tablet Commonly known as: SYNTHROID TAKE 1 TABLET BY MOUTH  DAILY BEFORE BREAKFAST   methylPREDNISolone 4 MG Tbpk tablet Commonly known as: MEDROL DOSEPAK Take as directed. What changed: additional instructions   montelukast 10 MG tablet Commonly known as: SINGULAIR TAKE 1 TABLET BY MOUTH AT  BEDTIME   nystatin-triamcinolone ointment Commonly known as: MYCOLOG Apply 1 application topically 2 (two) times daily as needed (applied to skin folds/groins/under breast skin irritation rash.).   OXYGEN Inhale 2-3 L/min into the lungs See admin instructions. Inhale  2-3 L/min of oxygen into the lungs w/CPAP at bedtime and as needed for shortness of breath with "strenuous activity" during the day   pantoprazole 40 MG tablet Commonly known as: PROTONIX TAKE 1 TABLET BY MOUTH  DAILY   Pataday 0.2 % Soln Generic drug: Olopatadine HCl Apply 1 drop to eye daily.  polyethylene glycol 17 g packet Commonly known as: MIRALAX / GLYCOLAX Take 17 g by mouth daily as needed for mild constipation.   pregabalin 150 MG capsule Commonly known as: LYRICA Take 1 capsule (150 mg total) by mouth at bedtime.   QUEtiapine 50 MG tablet Commonly known as: SEROQUEL TAKE 1 TABLET BY MOUTH AT  BEDTIME   SALONPAS ARTHRITIS PAIN RELIEF EX Apply 1 patch topically daily as needed (to painful sites- remove as directed).   traZODone 100 MG tablet Commonly known as: DESYREL TAKE 1 TABLET BY MOUTH AT  BEDTIME   varenicline 1 MG tablet Commonly known as: CHANTIX Take 1 tablet (1 mg total) by mouth 2 (two) times daily. For smoking cessation. Replaces: varenicline 0.5 MG X 11 & 1 MG X 42 tablet   venlafaxine XR 150 MG 24 hr capsule Commonly known as: EFFEXOR-XR TAKE 1 CAPSULE BY MOUTH   DAILY WITH BREAKFAST   witch hazel-glycerin pad Commonly known as: TUCKS Apply topically as needed for itching.         Follow-up Information     [name], MD Follow up in 1 week(s).   Specialty: Internal Medicine Contact information: [place] [address] [phone]                 Allergies  Allergen Reactions   Keflex [Cephalexin] Rash and Other (See Comments)    Has tolerated amoxicillin since had keflex   Prednisone Other (See Comments)    "counter reacts"   Shellfish Allergy Shortness Of Breath, Nausea And Vomiting and Other (See Comments)    Stomach cramps   Tetracyclines & Related Anaphylaxis, Swelling and Other (See Comments)    Throat swelling requiring hospitalization   Dilaudid [Hydromorphone Hcl] Other (See Comments)    "Lethargy"   Incruse Ellipta [Umeclidinium Bromide] Nausea Only   Other    Tuberculin Tests Other (See Comments)    False postive     The results of significant diagnostics from this hospitalization (including imaging, microbiology, ancillary and laboratory) are listed below for reference.   Consultations:   Procedures/Studies: DG Chest Port 1 View  Result Date: 03/13/2021 CLINICAL DATA:  Shortness of breath for 2 weeks, initial encounter EXAM: PORTABLE CHEST 1 VIEW COMPARISON:  08/10/2020 FINDINGS: Cardiac shadow is within normal limits. Aortic calcifications are noted. Lungs are mildly hyperinflated. No bony abnormality is seen. IMPRESSION: Mild COPD without acute abnormality. Electronically Signed   By: Inez Catalina M.D.   On: 03/13/2021 23:32      Labs: BNP (last 3 results) Recent Labs    08/07/20 1943 03/13/21 2251  BNP 76.7 55.7   Basic Metabolic Panel: Recent Labs  Lab 03/15/21 0603 03/16/21 0612 03/17/21 0537 03/18/21 0432 03/19/21 0537  NA 136 137 138 135 137  K 3.4* 3.4* 3.6 3.3* 4.0  CL 100 99 95* 94* 95*  CO2 27 30 31 29  33*  GLUCOSE 220* 210* 180* 157* 183*  BUN  16 21* 26* 29* 30*  CREATININE 0.88 0.97 0.84 0.87 1.01*  CALCIUM 8.9 9.3 9.7 9.4 9.2  MG 1.7 1.7 2.3 1.6* 2.0   Liver Function Tests: Recent Labs  Lab 03/13/21 2344  AST 25  ALT 17  ALKPHOS 106  BILITOT 0.8  PROT 6.6  ALBUMIN 3.5   No results for input(s): LIPASE, AMYLASE in the last 168 hours. No results for input(s): AMMONIA in the last 168 hours. CBC: Recent Labs  Lab 03/13/21 2251 03/14/21 3220 03/15/21 0603 03/16/21  9024 03/17/21 0537 03/18/21 0432 03/19/21 0537  WBC 11.4*   < > 14.2* 11.0* 11.7* 13.2* 14.0*  NEUTROABS 6.9  --   --   --   --   --   --   HGB 13.4   < > 11.0* 12.1 12.7 12.5 12.7  HCT 39.7   < > 33.4* 36.7 40.1 38.4 39.4  MCV 89.0   < > 89.1 90.0 90.9 89.3 91.6  PLT 392   < > 282 273 319 308 325   < > = values in this interval not displayed.   Cardiac Enzymes: No results for input(s): CKTOTAL, CKMB, CKMBINDEX, TROPONINI in the last 168 hours. BNP: Invalid input(s): POCBNP CBG: Recent Labs  Lab 03/18/21 0834 03/18/21 1128 03/18/21 1617 03/18/21 2047 03/19/21 0837  GLUCAP 142* 397* 287* 224* 190*   D-Dimer No results for input(s): DDIMER in the last 72 hours. Hgb A1c No results for input(s): HGBA1C in the last 72 hours. Lipid Profile No results for input(s): CHOL, HDL, LDLCALC, TRIG, CHOLHDL, LDLDIRECT in the last 72 hours. Thyroid function studies No results for input(s): TSH, T4TOTAL, T3FREE, THYROIDAB in the last 72 hours.  Invalid input(s): FREET3 Anemia work up No results for input(s): VITAMINB12, FOLATE, FERRITIN, TIBC, IRON, RETICCTPCT in the last 72 hours. Urinalysis    Component Value Date/Time   COLORURINE YELLOW 03/30/2019 1838   APPEARANCEUR CLEAR 03/30/2019 1838   LABSPEC 1.045 (H) 03/30/2019 1838   PHURINE 7.0 03/30/2019 1838   GLUCOSEU NEGATIVE 03/30/2019 1838   GLUCOSEU NEGATIVE 08/05/2017 1652   HGBUR SMALL (A) 03/30/2019 1838   BILIRUBINUR NEGATIVE 03/30/2019 1838   KETONESUR NEGATIVE 03/30/2019 1838    PROTEINUR NEGATIVE 03/30/2019 1838   UROBILINOGEN 0.2 08/05/2017 1652   NITRITE NEGATIVE 03/30/2019 1838   LEUKOCYTESUR NEGATIVE 03/30/2019 1838   Sepsis Labs Invalid input(s): PROCALCITONIN,  WBC,  LACTICIDVEN Microbiology Recent Results (from the past 240 hour(s))  Resp Panel by RT-PCR (Flu A&B, Covid) Nasopharyngeal Swab     Status: None   Collection Time: 03/13/21 11:11 PM   Specimen: Nasopharyngeal Swab; Nasopharyngeal(NP) swabs in vial transport medium  Result Value Ref Range Status   SARS Coronavirus 2 by RT PCR NEGATIVE NEGATIVE Final    Comment: (NOTE) SARS-CoV-2 target nucleic acids are NOT DETECTED.  The SARS-CoV-2 RNA is generally detectable in upper respiratory specimens during the acute phase of infection. The lowest concentration of SARS-CoV-2 viral copies this assay can detect is 138 copies/mL. A negative result does not preclude SARS-Cov-2 infection and should not be used as the sole basis for treatment or other patient management decisions. A negative result may occur with  improper specimen collection/handling, submission of specimen other than nasopharyngeal swab, presence of viral mutation(s) within the areas targeted by this assay, and inadequate number of viral copies(<138 copies/mL). A negative result must be combined with clinical observations, patient history, and epidemiological information. The expected result is Negative.  Fact Sheet for Patients:  EntrepreneurPulse.com.au  Fact Sheet for Healthcare Providers:  IncredibleEmployment.be  This test is no t yet approved or cleared by the [place] FDA and  has been authorized for detection and/or diagnosis of SARS-CoV-2 by FDA under an Emergency Use Authorization (EUA). This EUA will remain  in effect (meaning this test can be used) for the duration of the COVID-19 declaration under Section 564(b)(1) of the Act, 21 U.S.C.section 360bbb-3(b)(1), unless the  authorization is terminated  or revoked sooner.       Influenza A by PCR NEGATIVE  NEGATIVE Final   Influenza B by PCR NEGATIVE NEGATIVE Final    Comment: (NOTE) The Xpert Xpress SARS-CoV-2/FLU/RSV plus assay is intended as an aid in the diagnosis of influenza from Nasopharyngeal swab specimens and should not be used as a sole basis for treatment. Nasal washings and aspirates are unacceptable for Xpert Xpress SARS-CoV-2/FLU/RSV testing.  Fact Sheet for Patients: EntrepreneurPulse.com.au  Fact Sheet for Healthcare Providers: IncredibleEmployment.be  This test is not yet approved or cleared by the [place] FDA and has been authorized for detection and/or diagnosis of SARS-CoV-2 by FDA under an Emergency Use Authorization (EUA). This EUA will remain in effect (meaning this test can be used) for the duration of the COVID-19 declaration under Section 564(b)(1) of the Act, 21 U.S.C. section 360bbb-3(b)(1), unless the authorization is terminated or revoked.  Performed at [facility], [place], [place] [address]   Respiratory (~20 pathogens) panel by PCR     Status: None   Collection Time: 03/15/21  5:05 PM   Specimen: Nasopharyngeal Swab; Respiratory  Result Value Ref Range Status   Adenovirus NOT DETECTED NOT DETECTED Final   Coronavirus 229E NOT DETECTED NOT DETECTED Final    Comment: (NOTE) The Coronavirus on the Respiratory Panel, DOES NOT test for the novel  Coronavirus (2019 nCoV)    Coronavirus HKU1 NOT DETECTED NOT DETECTED Final   Coronavirus NL63 NOT DETECTED NOT DETECTED Final   Coronavirus OC43 NOT DETECTED NOT DETECTED Final   Metapneumovirus NOT DETECTED NOT DETECTED Final   Rhinovirus / Enterovirus NOT DETECTED NOT DETECTED Final   Influenza A NOT DETECTED NOT DETECTED Final   Influenza B NOT DETECTED NOT DETECTED Final   Parainfluenza Virus 1 NOT DETECTED NOT DETECTED Final   Parainfluenza Virus  2 NOT DETECTED NOT DETECTED Final   Parainfluenza Virus 3 NOT DETECTED NOT DETECTED Final   Parainfluenza Virus 4 NOT DETECTED NOT DETECTED Final   Respiratory Syncytial Virus NOT DETECTED NOT DETECTED Final   Bordetella pertussis NOT DETECTED NOT DETECTED Final   Bordetella Parapertussis NOT DETECTED NOT DETECTED Final   Chlamydophila pneumoniae NOT DETECTED NOT DETECTED Final   Mycoplasma pneumoniae NOT DETECTED NOT DETECTED Final    Comment: Performed at [facility], [place]. [address], [place], [place] [address]     Total time spend on discharging this patient, including the last patient exam, discussing the hospital stay, instructions for ongoing care as it relates to all pertinent caregivers, as well as preparing the medical discharge records, prescriptions, and/or referrals as applicable, is 40 minutes.    Enzo Bi, MD  [facility] 03/19/2021, 10:08 AM

## 2021-03-19 NOTE — Progress Notes (Signed)
Reviewed discharge Note with pt. Pt verbalized understanding. EMS transported pt from [facility] to home.

## 2021-03-19 NOTE — Progress Notes (Signed)
Inpatient Diabetes Program Recommendations  AACE/ADA: New Consensus Statement on Inpatient Glycemic Control   Target Ranges:  Prepandial:   less than 140 mg/dL      Peak postprandial:   less than 180 mg/dL (1-2 hours)      Critically ill patients:  140 - 180 mg/dL    Latest Reference Range & Units 03/18/21 08:34 03/18/21 11:28 03/18/21 16:17 03/18/21 20:47 03/19/21 08:37  Glucose-Capillary 70 - 99 mg/dL 142 (H) 397 (H) 287 (H) 224 (H) 190 (H)   Review of Glycemic Control  Diabetes history: DM2 Outpatient Diabetes medications: None Current orders for Inpatient glycemic control: Novolog 0-20 units TID with meals, Novolog 0-5 units QHS, Novolog 4 units TID with meals; Prednisone 20 mg QAM  Inpatient Diabetes Program Recommendations:    Diet: If appropriate, please discontinue Regular diet and order Carb Modified diet.   Insulin: If patient remains inpatient and steroids are continued and post prandial glucose is consistently over 180 mg/dl, please consider increasing meal coverage to Novolog 7 units TID with meals.  Thanks, Barnie Alderman, RN, MSN, CDE Diabetes Coordinator Inpatient Diabetes Program [phone] (Team Pager from 8am to 5pm)

## 2021-03-19 NOTE — TOC Transition Note (Addendum)
Transition of Care [facility]) - CM/SW Discharge Note   Patient Details  Name: Joan [name] MRN: [id number] Date of Birth: [birth year]  [facility] CM/SW Contact:  Kerin Salen, RN Phone Number: 03/19/2021, 3:49 PM   Clinical Narrative:  Patient to discharge today to home with [facility], PT/OT/SW week delayed due to staffing, Attending notified. Patient denies SNF due to need to pay bills and take care of Car taxes.  [facility] non-emergent transport arranged patient did not want to go via Cab due to not having clothing and she arrived by EMS.    Final next level of care: Home w Home Health Services Barriers to Discharge: Barriers Resolved   Patient Goals and CMS Choice Patient states their goals for this hospitalization and ongoing recovery are:: Home with Home Health      Discharge Placement                Patient to be transferred to facility by: [facility]   Patient and family notified of of transfer: 03/19/21  Discharge Plan and Services                          HH Arranged: PT, OT, Social Work (week delay due to staffing) [place] Agency: Other - See comment Corporate treasurer HH) Date HH Agency Contacted: 03/19/21   Representative spoke with at [place]: Meg  Social Determinants of Health ([place]) Interventions     Readmission Risk Interventions Readmission Risk Prevention Plan 04/07/2019  Transportation Screening Complete  PCP or Specialist Appt within 3-5 Days Complete  HRI or [place] Complete  Social Work Consult for [place] Planning/Counseling Complete  Palliative Care Screening Not Applicable  Medication Review Press photographer) Complete  Some recent data might be hidden

## 2021-03-20 NOTE — TOC Progression Note (Signed)
Transition of Care [facility]) - Progression Note    Patient Details  Name: Joan [name] MRN: [id number] Date of Birth: 1961-01-04  Transition of Care [facility]) CM/SW [place], RN Phone Number: 03/20/2021, 9:25 AM  Clinical Narrative:   Patient transported home with EMS< EMS would not transport the Portable O2 tank, I reached out to [employer] and asked if they can pick up from nurses desk and arrange delivery to her home,       Barriers to Discharge: Barriers Resolved  Expected Discharge Plan and Services           Expected Discharge Date: 03/19/21                         HH Arranged: PT, OT, Social Work (week delay due to staffing) [place] Agency: Other - See comment [name]) Date HH Agency Contacted: 03/19/21   Representative spoke with at [place]: [place] ([place]) Interventions    Readmission Risk Interventions Readmission Risk Prevention Plan 04/07/2019  Transportation Screening Complete  PCP or Specialist Appt within 3-5 Days Complete  HRI or [place] Complete  Social Work Consult for [place] Planning/Counseling [place] Not Applicable  Medication Review Press photographer) Complete  Some recent data might be hidden

## 2021-03-22 ENCOUNTER — Telehealth: Payer: Self-pay | Admitting: Internal Medicine

## 2021-03-22 ENCOUNTER — Inpatient Hospital Stay: Payer: Medicare Other | Admitting: Internal Medicine

## 2021-03-22 ENCOUNTER — Ambulatory Visit (INDEPENDENT_AMBULATORY_CARE_PROVIDER_SITE_OTHER): Payer: Medicare Other | Admitting: Licensed Clinical Social Worker

## 2021-03-22 DIAGNOSIS — I10 Essential (primary) hypertension: Secondary | ICD-10-CM | POA: Diagnosis not present

## 2021-03-22 DIAGNOSIS — F411 Generalized anxiety disorder: Secondary | ICD-10-CM | POA: Diagnosis not present

## 2021-03-22 DIAGNOSIS — G4733 Obstructive sleep apnea (adult) (pediatric): Secondary | ICD-10-CM | POA: Diagnosis not present

## 2021-03-22 DIAGNOSIS — F4310 Post-traumatic stress disorder, unspecified: Secondary | ICD-10-CM

## 2021-03-22 DIAGNOSIS — F0630 Mood disorder due to known physiological condition, unspecified: Secondary | ICD-10-CM

## 2021-03-22 DIAGNOSIS — F17200 Nicotine dependence, unspecified, uncomplicated: Secondary | ICD-10-CM | POA: Diagnosis not present

## 2021-03-22 DIAGNOSIS — E1140 Type 2 diabetes mellitus with diabetic neuropathy, unspecified: Secondary | ICD-10-CM | POA: Diagnosis not present

## 2021-03-22 DIAGNOSIS — R443 Hallucinations, unspecified: Secondary | ICD-10-CM

## 2021-03-22 DIAGNOSIS — F32A Depression, unspecified: Secondary | ICD-10-CM | POA: Diagnosis not present

## 2021-03-22 DIAGNOSIS — J45909 Unspecified asthma, uncomplicated: Secondary | ICD-10-CM | POA: Diagnosis not present

## 2021-03-22 DIAGNOSIS — J441 Chronic obstructive pulmonary disease with (acute) exacerbation: Secondary | ICD-10-CM | POA: Diagnosis not present

## 2021-03-22 DIAGNOSIS — Z9989 Dependence on other enabling machines and devices: Secondary | ICD-10-CM | POA: Diagnosis not present
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
## 2021-03-22 NOTE — Telephone Encounter (Signed)
Home Health verbal orders-caller/Agency: Jobe Gibbon- [facility]  Callback number: [phone] (secure vm)  Requesting OT/PT/Skilled nursing/Social Work/Speech: PT and HH Nurse  Frequency: 1w1 2w2 1w2   Caller making provider aware of pt's medication interactions:  chlorpheniramine-HYDROcodone 10-8 MG/5ML  Interacts w/   pregabalin (LYRICA) 150 MG capsule  cyclobenzaprine (FLEXERIL) 10 MG tablet  QUEtiapine (SEROQUEL) 50 MG tablet

## 2021-03-23 NOTE — Telephone Encounter (Signed)
Okay for verbals needs to keep hospital follow up visit for ongoing.

## 2021-03-27 ENCOUNTER — Encounter: Payer: Self-pay | Admitting: Internal Medicine

## 2021-03-27 ENCOUNTER — Other Ambulatory Visit: Payer: Self-pay

## 2021-03-27 ENCOUNTER — Ambulatory Visit (INDEPENDENT_AMBULATORY_CARE_PROVIDER_SITE_OTHER): Payer: Medicare Other | Admitting: Internal Medicine

## 2021-03-27 VITALS — BP 132/72 | HR 67 | Temp 97.8°F | Ht 68.0 in | Wt 201.4 lb

## 2021-03-27 DIAGNOSIS — R1084 Generalized abdominal pain: Secondary | ICD-10-CM | POA: Diagnosis not present

## 2021-03-27 DIAGNOSIS — J441 Chronic obstructive pulmonary disease with (acute) exacerbation: Secondary | ICD-10-CM | POA: Diagnosis not present

## 2021-03-27 DIAGNOSIS — E118 Type 2 diabetes mellitus with unspecified complications: Secondary | ICD-10-CM

## 2021-03-27 LAB — MICROALBUMIN / CREATININE URINE RATIO
Creatinine,U: 99.5 mg/dL
Microalb Creat Ratio: 0.8 mg/g (ref 0.0–30.0)
Microalb, Ur: 0.8 mg/dL (ref 0.0–1.9)

## 2021-03-27 LAB — CBC
HCT: 35 % — ABNORMAL LOW (ref 36.0–46.0)
Hemoglobin: 11.4 g/dL — ABNORMAL LOW (ref 12.0–15.0)
MCHC: 32.6 g/dL (ref 30.0–36.0)
MCV: 89.6 fl (ref 78.0–100.0)
Platelets: 308 10*3/uL (ref 150.0–400.0)
RBC: 3.9 Mil/uL (ref 3.87–5.11)
RDW: 15.7 % — ABNORMAL HIGH (ref 11.5–15.5)
WBC: 13.3 10*3/uL — ABNORMAL HIGH (ref 4.0–10.5)

## 2021-03-27 LAB — COMPREHENSIVE METABOLIC PANEL
ALT: 26 U/L (ref 0–35)
AST: 20 U/L (ref 0–37)
Albumin: 3.8 g/dL (ref 3.5–5.2)
Alkaline Phosphatase: 139 U/L — ABNORMAL HIGH (ref 39–117)
BUN: 19 mg/dL (ref 6–23)
CO2: 33 mEq/L — ABNORMAL HIGH (ref 19–32)
Calcium: 9.4 mg/dL (ref 8.4–10.5)
Chloride: 95 mEq/L — ABNORMAL LOW (ref 96–112)
Creatinine, Ser: 1.11 mg/dL (ref 0.40–1.20)
GFR: 54.45 mL/min — ABNORMAL LOW (ref >60.00)
Glucose, Bld: 127 mg/dL — ABNORMAL HIGH (ref 70–99)
Potassium: 3.3 mEq/L — ABNORMAL LOW (ref 3.5–5.1)
Sodium: 135 mEq/L (ref 135–145)
Total Bilirubin: 0.4 mg/dL (ref 0.2–1.2)
Total Protein: 6.5 g/dL (ref 6.0–8.3)

## 2021-03-27 LAB — LIPASE: Lipase: 36 U/L (ref 11.0–59.0)

## 2021-03-27 MED ORDER — PREGABALIN 150 MG PO CAPS: 150.0000 mg | ORAL_CAPSULE | Freq: Every day | ORAL | 5 refills | Status: DC

## 2021-03-27 NOTE — Patient Instructions (Addendum)
We will check labs today and a scan of the stomach and get you back in with GI.  We have sent in the refill of lyrica.

## 2021-03-27 NOTE — Progress Notes (Signed)
° °  Subjective:   Patient ID: Joan [name], female    DOB: 22-Aug-1960, 61 y.o.   MRN: [id number]  HPI The patient is a 61 YO female coming in for hospital follow up. Admitted for COPD exacerbation. Breathing is improving. Worsening left abdomen and generalized pain. Some nausea and constipation. No blood in stool.   Review of Systems  Constitutional:  Positive for activity change and fatigue.  HENT: Negative.    Eyes: Negative.   Respiratory:  Positive for cough and shortness of breath. Negative for chest tightness.   Cardiovascular:  Negative for chest pain, palpitations and leg swelling.  Gastrointestinal:  Positive for abdominal distention, abdominal pain, constipation and nausea. Negative for anal bleeding, blood in stool, diarrhea and vomiting.  Musculoskeletal:  Positive for arthralgias, back pain and myalgias.  Skin: Negative.   Neurological: Negative.   Psychiatric/Behavioral: Negative.     Objective:  Physical Exam Constitutional:      Appearance: She is well-developed.  HENT:     Head: Normocephalic and atraumatic.  Cardiovascular:     Rate and Rhythm: Normal rate and regular rhythm.  Pulmonary:     Effort: Pulmonary effort is normal. No respiratory distress.     Breath sounds: Wheezing present. No rales.     Comments: Lung exam at baseline, oxygen  Abdominal:     General: Bowel sounds are normal. There is no distension.     Palpations: Abdomen is soft.     Tenderness: There is abdominal tenderness. There is no rebound.     Comments: Diffuse tenderness non-specific, no guarding or mass  Musculoskeletal:     Cervical back: Normal range of motion.  Skin:    General: Skin is warm and dry.  Neurological:     Mental Status: She is alert and oriented to person, place, and time.     Coordination: Coordination abnormal.     Comments: Wheelchair for longer distances    Vitals:   03/27/21 1420  BP: 132/72  Pulse: 67  Temp: 97.8 F (36.6 C)  TempSrc: Oral   SpO2: 97%  Weight: 201 lb 6.4 oz (91.4 kg)  Height: 5\' 8"  (1.727 m)    This visit occurred during the SARS-CoV-2 public health emergency.  Safety protocols were in place, including screening questions prior to the visit, additional usage of staff PPE, and extensive cleaning of exam room while observing appropriate contact time as indicated for disinfecting solutions.   Assessment & Plan:

## 2021-03-28 ENCOUNTER — Encounter: Payer: Self-pay | Admitting: Internal Medicine

## 2021-03-28 DIAGNOSIS — R1084 Generalized abdominal pain: Secondary | ICD-10-CM | POA: Insufficient documentation

## 2021-03-28 DIAGNOSIS — J441 Chronic obstructive pulmonary disease with (acute) exacerbation: Secondary | ICD-10-CM | POA: Diagnosis not present

## 2021-03-28 DIAGNOSIS — F32A Depression, unspecified: Secondary | ICD-10-CM | POA: Diagnosis not present

## 2021-03-28 DIAGNOSIS — F17200 Nicotine dependence, unspecified, uncomplicated: Secondary | ICD-10-CM | POA: Diagnosis not present

## 2021-03-28 DIAGNOSIS — J45909 Unspecified asthma, uncomplicated: Secondary | ICD-10-CM | POA: Diagnosis not present

## 2021-03-28 DIAGNOSIS — E1140 Type 2 diabetes mellitus with diabetic neuropathy, unspecified: Secondary | ICD-10-CM | POA: Diagnosis not present

## 2021-03-28 DIAGNOSIS — Z9989 Dependence on other enabling machines and devices: Secondary | ICD-10-CM | POA: Diagnosis not present

## 2021-03-28 DIAGNOSIS — I10 Essential (primary) hypertension: Secondary | ICD-10-CM | POA: Diagnosis not present

## 2021-03-28 DIAGNOSIS — G4733 Obstructive sleep apnea (adult) (pediatric): Secondary | ICD-10-CM | POA: Diagnosis not present

## 2021-03-28 NOTE — Telephone Encounter (Signed)
Spoke with Erline Levine to give a verbal orders.

## 2021-03-28 NOTE — Assessment & Plan Note (Addendum)
Checking CBC, CMP, lipase and CT abdomen and pelvis without contrast. She does have prior abdominal surgeries which may mean she has adhesions. She has new constipation with nausea. Bowel sounds present on exam. Referral done so she can return to her GI provider for assessment as well.

## 2021-03-28 NOTE — Assessment & Plan Note (Signed)
Foot exam done, reviewed labs from hospital HgA1c at goal. She is taking statin and diet controlled. Checking microalbumin to creatinine ratio and Foot exam done. Reminded about yearly eye exam.

## 2021-03-28 NOTE — Assessment & Plan Note (Signed)
This is resolving but not back to baseline. Home health is working with her.

## 2021-03-29 ENCOUNTER — Telehealth: Payer: Self-pay

## 2021-03-29 ENCOUNTER — Telehealth: Payer: Self-pay | Admitting: Internal Medicine

## 2021-03-29 DIAGNOSIS — J441 Chronic obstructive pulmonary disease with (acute) exacerbation: Secondary | ICD-10-CM | POA: Diagnosis not present

## 2021-03-29 DIAGNOSIS — G4733 Obstructive sleep apnea (adult) (pediatric): Secondary | ICD-10-CM | POA: Diagnosis not present

## 2021-03-29 DIAGNOSIS — I10 Essential (primary) hypertension: Secondary | ICD-10-CM | POA: Diagnosis not present

## 2021-03-29 DIAGNOSIS — F32A Depression, unspecified: Secondary | ICD-10-CM | POA: Diagnosis not present

## 2021-03-29 DIAGNOSIS — J45909 Unspecified asthma, uncomplicated: Secondary | ICD-10-CM | POA: Diagnosis not present

## 2021-03-29 DIAGNOSIS — Z9989 Dependence on other enabling machines and devices: Secondary | ICD-10-CM | POA: Diagnosis not present

## 2021-03-29 DIAGNOSIS — F17200 Nicotine dependence, unspecified, uncomplicated: Secondary | ICD-10-CM | POA: Diagnosis not present

## 2021-03-29 DIAGNOSIS — E1140 Type 2 diabetes mellitus with diabetic neuropathy, unspecified: Secondary | ICD-10-CM | POA: Diagnosis not present

## 2021-03-29 NOTE — Chronic Care Management (AMB) (Signed)
Chronic Care Management Pharmacy Assistant   Name: Joan [name]  MRN: [id number] DOB: 02-24-1961   Reason for Encounter: Disease State   Conditions to be addressed/monitored: COPD   Recent office visits:  03/27/21 [name], MD-PCP (abdominal pain) Labs ordered  Recent consult visits:  03/14/21 [name], MD-Behavioral Health (Mood disorder)  Hospital visits:  Medication Reconciliation was completed by comparing discharge summary, patients EMR and Pharmacy list, and upon discussion with patient.  Admitted to the hospital on 03/13/21 due to COPD flare up. Discharge date was 03/23/21. Discharged from [facility].    Medications Discontinued at Hospital Discharge: -Stopped bisoprolol, mupirocin, nystatin, varenicline   Medications that remain the same after Hospital Discharge:??  -All other medications will remain the same.     Admitted to the hospital on 04/02/21 due to COPD flare up. Discharge date was 04/05/21. Discharged from [facility]   Medications: Outpatient Encounter Medications as of 03/29/2021  Medication Sig   acyclovir (ZOVIRAX) 400 MG tablet TAKE 1 TABLET BY MOUTH  TWICE DAILY   albuterol (PROVENTIL) (2.5 MG/3ML) 0.083% nebulizer solution Take 3 mLs (2.5 mg total) by nebulization 2 (two) times daily. And every 6 hours as needed.  DX: J44.9   atenolol (TENORMIN) 25 MG tablet Take 25 mg by mouth daily.   atorvastatin (LIPITOR) 20 MG tablet TAKE 1 TABLET BY MOUTH  DAILY   Budeson-Glycopyrrol-Formoterol (BREZTRI AEROSPHERE) 160-9-4.8 MCG/ACT AERO Inhale 2 puffs into the lungs in the morning and at bedtime.   buPROPion (WELLBUTRIN XL) 150 MG 24 hr tablet TAKE 1 TABLET BY MOUTH  DAILY   busPIRone (BUSPAR) 5 MG tablet TAKE 1/2 TO 1 TABLET BY MOUTH  TWICE DAILY AS NEEDED   Camphor-Menthol-Methyl Sal 3.03-02-08 % PTCH Apply 1 patch topically daily as needed (pain).   cyclobenzaprine (FLEXERIL) 10 MG tablet Take 10  mg by mouth at bedtime.   hydrochlorothiazide (HYDRODIURIL) 25 MG tablet TAKE 1 TABLET BY MOUTH  DAILY   Ipratropium-Albuterol (COMBIVENT RESPIMAT) 20-100 MCG/ACT AERS respimat Inhale 1 puff into the lungs every 6 (six) hours as needed for wheezing or shortness of breath.   levothyroxine (SYNTHROID) 25 MCG tablet TAKE 1 TABLET BY MOUTH  DAILY BEFORE BREAKFAST   Liniments (SALONPAS ARTHRITIS PAIN RELIEF EX) Apply 1 patch topically daily as needed (to painful sites- remove as directed).    montelukast (SINGULAIR) 10 MG tablet TAKE 1 TABLET BY MOUTH AT  BEDTIME   nystatin-triamcinolone ointment (MYCOLOG) Apply 1 application topically 2 (two) times daily as needed (applied to skin folds/groins/under breast skin irritation rash.).   OXYGEN Inhale 2-3 L/min into the lungs See admin instructions. Inhale  2-3 L/min of oxygen into the lungs w/CPAP at bedtime and as needed for shortness of breath with "strenuous activity" during the day   pantoprazole (PROTONIX) 40 MG tablet TAKE 1 TABLET BY MOUTH  DAILY   PATADAY 0.2 % SOLN Apply 1 drop to eye daily.   polyethylene glycol (MIRALAX / GLYCOLAX) 17 g packet Take 17 g by mouth daily as needed for mild constipation.   pregabalin (LYRICA) 150 MG capsule Take 1 capsule (150 mg total) by mouth at bedtime.   QUEtiapine (SEROQUEL) 50 MG tablet TAKE 1 TABLET BY MOUTH AT  BEDTIME   roflumilast (DALIRESP) 500 MCG TABS tablet Take 1 tablet (500 mcg total) by mouth daily.   traZODone (DESYREL) 100 MG tablet TAKE 1 TABLET BY MOUTH AT  BEDTIME   varenicline (CHANTIX) 1 MG  tablet Take 1 tablet (1 mg total) by mouth 2 (two) times daily. For smoking cessation.   venlafaxine XR (EFFEXOR-XR) 150 MG 24 hr capsule TAKE 1 CAPSULE BY MOUTH  DAILY WITH BREAKFAST   witch hazel-glycerin (TUCKS) pad Apply topically as needed for itching.   No facility-administered encounter medications on file as of 03/29/2021.    CAT Score 04/21/2020  Total CAT Score 39   Current COPD regimen:         Combivent 20-100 mcg/act        Breztri aer 160-9-4.8 mcg/act  Any recent hospitalizations or ED visits since last visit with CPP? Yes, copd exacerbation  Reports denies COPD symptoms, including Increased shortness of breath  What recent interventions/DTPs have been made by any provider to improve breathing since last visit: Have you had exacerbation/flare-up since last visit? Yes What do you do when you are short of breath?  Rest and also use rescue inhaler  Respiratory Devices/Equipment Do you have a nebulizer? No Do you use a Peak Flow Meter? No Do you use a maintenance inhaler? Yes How often do you forget to use your daily inhaler? Patient states when she can't get her breath by resting Do you use a rescue inhaler? Yes How often do you use your rescue inhaler?  3-5x times per week Do you use a spacer with your inhaler? Yes  Adherence Review: Does the patient have >5 day gap between last estimated fill date for maintenance inhaler medications? No   Care Gaps: Colonoscopy-11/24/18 Diabetic Foot Exam-03/27/21 Mammogram-06/08/19 Ophthalmology-NA Dexa Scan - NA Annual Well Visit - NA Micro albumin-03/27/21 Hemoglobin A1c- 03/14/21  Star Rating Drugs: Atorvastatin 20 mg-last fill 02/17/21 90 ds    Ethelene Hal Clinical Pharmacist Assistant [phone]

## 2021-03-29 NOTE — Telephone Encounter (Signed)
Pt has questions regarding upcoming abdomen ct scan  Pt requesting a c/b [phone]

## 2021-03-30 NOTE — Telephone Encounter (Signed)
Spoke with the pt and she stated that she does not have any questions at this time. I advised if she does she can give our office a call back.

## 2021-03-31 DIAGNOSIS — J45909 Unspecified asthma, uncomplicated: Secondary | ICD-10-CM | POA: Diagnosis not present

## 2021-03-31 DIAGNOSIS — E1140 Type 2 diabetes mellitus with diabetic neuropathy, unspecified: Secondary | ICD-10-CM | POA: Diagnosis not present

## 2021-03-31 DIAGNOSIS — J441 Chronic obstructive pulmonary disease with (acute) exacerbation: Secondary | ICD-10-CM | POA: Diagnosis not present

## 2021-03-31 DIAGNOSIS — F17200 Nicotine dependence, unspecified, uncomplicated: Secondary | ICD-10-CM | POA: Diagnosis not present

## 2021-03-31 DIAGNOSIS — I10 Essential (primary) hypertension: Secondary | ICD-10-CM | POA: Diagnosis not present

## 2021-03-31 DIAGNOSIS — Z9989 Dependence on other enabling machines and devices: Secondary | ICD-10-CM | POA: Diagnosis not present

## 2021-03-31 DIAGNOSIS — G4733 Obstructive sleep apnea (adult) (pediatric): Secondary | ICD-10-CM | POA: Diagnosis not present

## 2021-03-31 DIAGNOSIS — F32A Depression, unspecified: Secondary | ICD-10-CM | POA: Diagnosis not present

## 2021-04-02 ENCOUNTER — Inpatient Hospital Stay
Admission: EM | Admit: 2021-04-02 | Discharge: 2021-04-05 | DRG: 191 | Disposition: A | Discharge status: Home / Self Care | Payer: Medicare Other | Attending: Internal Medicine | Admitting: Internal Medicine

## 2021-04-02 ENCOUNTER — Emergency Department: Payer: Medicare Other

## 2021-04-02 ENCOUNTER — Other Ambulatory Visit: Payer: Self-pay

## 2021-04-02 DIAGNOSIS — Z96643 Presence of artificial hip joint, bilateral: Secondary | ICD-10-CM | POA: Diagnosis not present

## 2021-04-02 DIAGNOSIS — G8929 Other chronic pain: Secondary | ICD-10-CM | POA: Diagnosis not present

## 2021-04-02 DIAGNOSIS — E871 Hypo-osmolality and hyponatremia: Secondary | ICD-10-CM | POA: Diagnosis not present

## 2021-04-02 DIAGNOSIS — F0630 Mood disorder due to known physiological condition, unspecified: Secondary | ICD-10-CM

## 2021-04-02 DIAGNOSIS — Z20822 Contact with and (suspected) exposure to covid-19: Secondary | ICD-10-CM | POA: Diagnosis present

## 2021-04-02 DIAGNOSIS — F17200 Nicotine dependence, unspecified, uncomplicated: Secondary | ICD-10-CM | POA: Diagnosis not present

## 2021-04-02 DIAGNOSIS — Z888 Allergy status to other drugs, medicaments and biological substances status: Secondary | ICD-10-CM

## 2021-04-02 DIAGNOSIS — J9611 Chronic respiratory failure with hypoxia: Secondary | ICD-10-CM | POA: Diagnosis present

## 2021-04-02 DIAGNOSIS — I2510 Atherosclerotic heart disease of native coronary artery without angina pectoris: Secondary | ICD-10-CM | POA: Diagnosis present

## 2021-04-02 DIAGNOSIS — F32A Depression, unspecified: Secondary | ICD-10-CM | POA: Diagnosis not present

## 2021-04-02 DIAGNOSIS — D72829 Elevated white blood cell count, unspecified: Secondary | ICD-10-CM | POA: Diagnosis present

## 2021-04-02 DIAGNOSIS — Z79890 Hormone replacement therapy (postmenopausal): Secondary | ICD-10-CM

## 2021-04-02 DIAGNOSIS — E876 Hypokalemia: Secondary | ICD-10-CM

## 2021-04-02 DIAGNOSIS — E1165 Type 2 diabetes mellitus with hyperglycemia: Secondary | ICD-10-CM | POA: Diagnosis present

## 2021-04-02 DIAGNOSIS — R0789 Other chest pain: Secondary | ICD-10-CM | POA: Diagnosis not present

## 2021-04-02 DIAGNOSIS — E039 Hypothyroidism, unspecified: Secondary | ICD-10-CM | POA: Diagnosis not present

## 2021-04-02 DIAGNOSIS — J441 Chronic obstructive pulmonary disease with (acute) exacerbation: Principal | ICD-10-CM | POA: Diagnosis present

## 2021-04-02 DIAGNOSIS — J449 Chronic obstructive pulmonary disease, unspecified: Secondary | ICD-10-CM | POA: Diagnosis not present

## 2021-04-02 DIAGNOSIS — J9621 Acute and chronic respiratory failure with hypoxia: Secondary | ICD-10-CM | POA: Diagnosis not present

## 2021-04-02 DIAGNOSIS — E7800 Pure hypercholesterolemia, unspecified: Secondary | ICD-10-CM | POA: Diagnosis present

## 2021-04-02 DIAGNOSIS — T380X5A Adverse effect of glucocorticoids and synthetic analogues, initial encounter: Secondary | ICD-10-CM | POA: Diagnosis present

## 2021-04-02 DIAGNOSIS — Z91013 Allergy to seafood: Secondary | ICD-10-CM | POA: Diagnosis not present

## 2021-04-02 DIAGNOSIS — Z7951 Long term (current) use of inhaled steroids: Secondary | ICD-10-CM

## 2021-04-02 DIAGNOSIS — Z881 Allergy status to other antibiotic agents status: Secondary | ICD-10-CM

## 2021-04-02 DIAGNOSIS — I10 Essential (primary) hypertension: Secondary | ICD-10-CM | POA: Diagnosis present

## 2021-04-02 DIAGNOSIS — J439 Emphysema, unspecified: Secondary | ICD-10-CM | POA: Diagnosis present

## 2021-04-02 DIAGNOSIS — F17210 Nicotine dependence, cigarettes, uncomplicated: Secondary | ICD-10-CM | POA: Diagnosis present

## 2021-04-02 DIAGNOSIS — Z23 Encounter for immunization: Secondary | ICD-10-CM | POA: Diagnosis not present

## 2021-04-02 DIAGNOSIS — Z8249 Family history of ischemic heart disease and other diseases of the circulatory system: Secondary | ICD-10-CM

## 2021-04-02 DIAGNOSIS — E1140 Type 2 diabetes mellitus with diabetic neuropathy, unspecified: Secondary | ICD-10-CM | POA: Diagnosis present

## 2021-04-02 DIAGNOSIS — Z955 Presence of coronary angioplasty implant and graft: Secondary | ICD-10-CM | POA: Diagnosis not present

## 2021-04-02 DIAGNOSIS — E119 Type 2 diabetes mellitus without complications: Secondary | ICD-10-CM

## 2021-04-02 DIAGNOSIS — G4733 Obstructive sleep apnea (adult) (pediatric): Secondary | ICD-10-CM | POA: Diagnosis not present

## 2021-04-02 DIAGNOSIS — Z9981 Dependence on supplemental oxygen: Secondary | ICD-10-CM

## 2021-04-02 DIAGNOSIS — R079 Chest pain, unspecified: Secondary | ICD-10-CM | POA: Diagnosis not present

## 2021-04-02 DIAGNOSIS — K219 Gastro-esophageal reflux disease without esophagitis: Secondary | ICD-10-CM | POA: Diagnosis not present

## 2021-04-02 DIAGNOSIS — F419 Anxiety disorder, unspecified: Secondary | ICD-10-CM | POA: Diagnosis present

## 2021-04-02 DIAGNOSIS — J45909 Unspecified asthma, uncomplicated: Secondary | ICD-10-CM | POA: Diagnosis not present

## 2021-04-02 DIAGNOSIS — F319 Bipolar disorder, unspecified: Secondary | ICD-10-CM | POA: Diagnosis present

## 2021-04-02 DIAGNOSIS — Z9989 Dependence on other enabling machines and devices: Secondary | ICD-10-CM | POA: Diagnosis not present

## 2021-04-02 DIAGNOSIS — R059 Cough, unspecified: Secondary | ICD-10-CM | POA: Diagnosis not present

## 2021-04-02 DIAGNOSIS — Z743 Need for continuous supervision: Secondary | ICD-10-CM | POA: Diagnosis not present

## 2021-04-02 DIAGNOSIS — Z833 Family history of diabetes mellitus: Secondary | ICD-10-CM

## 2021-04-02 DIAGNOSIS — Z79899 Other long term (current) drug therapy: Secondary | ICD-10-CM

## 2021-04-02 DIAGNOSIS — M1990 Unspecified osteoarthritis, unspecified site: Secondary | ICD-10-CM | POA: Diagnosis present

## 2021-04-02 LAB — BASIC METABOLIC PANEL
Anion gap: 10 (ref 5–15)
BUN: 10 mg/dL (ref 6–20)
CO2: 32 mmol/L (ref 22–32)
Calcium: 8.6 mg/dL — ABNORMAL LOW (ref 8.9–10.3)
Chloride: 93 mmol/L — ABNORMAL LOW (ref 98–111)
Creatinine, Ser: 1 mg/dL (ref 0.44–1.00)
GFR, Estimated: >60 mL/min (ref >60)
Glucose, Bld: 108 mg/dL — ABNORMAL HIGH (ref 70–99)
Potassium: 2.8 mmol/L — ABNORMAL LOW (ref 3.5–5.1)
Sodium: 135 mmol/L (ref 135–145)

## 2021-04-02 LAB — CBC
HCT: 34.1 % — ABNORMAL LOW (ref 36.0–46.0)
Hemoglobin: 11 g/dL — ABNORMAL LOW (ref 12.0–15.0)
MCH: 29 pg (ref 26.0–34.0)
MCHC: 32.3 g/dL (ref 30.0–36.0)
MCV: 90 fL (ref 80.0–100.0)
Platelets: 273 10*3/uL (ref 150–400)
RBC: 3.79 MIL/uL — ABNORMAL LOW (ref 3.87–5.11)
RDW: 14.4 % (ref 11.5–15.5)
WBC: 9 10*3/uL (ref 4.0–10.5)
nRBC: 0 % (ref 0.0–0.2)

## 2021-04-02 LAB — TROPONIN I (HIGH SENSITIVITY): Troponin I (High Sensitivity): 5 ng/L (ref <18)

## 2021-04-02 MED ORDER — ALBUTEROL SULFATE (2.5 MG/3ML) 0.083% IN NEBU: 2.5000 mg | INHALATION_SOLUTION | RESPIRATORY_TRACT | Status: DC | PRN

## 2021-04-02 MED ORDER — IPRATROPIUM-ALBUTEROL 0.5-2.5 (3) MG/3ML IN SOLN
9.0000 mL | Freq: Once | RESPIRATORY_TRACT | Status: AC
  Administered 2021-04-02: 9 mL via RESPIRATORY_TRACT
  Filled 2021-04-02: qty 3

## 2021-04-02 MED ORDER — ENOXAPARIN SODIUM 60 MG/0.6ML IJ SOSY
0.5000 mg/kg | PREFILLED_SYRINGE | INTRAMUSCULAR | Status: DC
  Administered 2021-04-03 – 2021-04-05 (×3): 45 mg via SUBCUTANEOUS
  Filled 2021-04-02 (×3): qty 0.6

## 2021-04-02 MED ORDER — MAGNESIUM SULFATE 2 GM/50ML IV SOLN
2.0000 g | Freq: Once | INTRAVENOUS | Status: AC
  Administered 2021-04-02: 2 g via INTRAVENOUS
  Filled 2021-04-02: qty 50

## 2021-04-02 MED ORDER — ALBUTEROL SULFATE (2.5 MG/3ML) 0.083% IN NEBU
INHALATION_SOLUTION | RESPIRATORY_TRACT | Status: AC
  Filled 2021-04-02: qty 3

## 2021-04-02 MED ORDER — HYDROCHLOROTHIAZIDE 25 MG PO TABS
25.0000 mg | ORAL_TABLET | Freq: Every day | ORAL | Status: DC
  Administered 2021-04-03 – 2021-04-05 (×3): 25 mg via ORAL
  Filled 2021-04-02 (×3): qty 1

## 2021-04-02 MED ORDER — ATENOLOL 25 MG PO TABS
25.0000 mg | ORAL_TABLET | Freq: Every day | ORAL | Status: DC
  Administered 2021-04-03 – 2021-04-05 (×3): 25 mg via ORAL
  Filled 2021-04-02 (×3): qty 1

## 2021-04-02 MED ORDER — CYCLOBENZAPRINE HCL 10 MG PO TABS
10.0000 mg | ORAL_TABLET | Freq: Every day | ORAL | Status: DC
  Administered 2021-04-03 – 2021-04-04 (×3): 10 mg via ORAL
  Filled 2021-04-02 (×3): qty 1

## 2021-04-02 MED ORDER — METHYLPREDNISOLONE SODIUM SUCC 40 MG IJ SOLR
40.0000 mg | Freq: Two times a day (BID) | INTRAMUSCULAR | Status: DC
  Administered 2021-04-03: 40 mg via INTRAVENOUS
  Filled 2021-04-02: qty 1

## 2021-04-02 MED ORDER — LEVOTHYROXINE SODIUM 25 MCG PO TABS
25.0000 ug | ORAL_TABLET | Freq: Every day | ORAL | Status: DC
  Administered 2021-04-03 – 2021-04-05 (×3): 25 ug via ORAL
  Filled 2021-04-02 (×3): qty 1

## 2021-04-02 MED ORDER — ONDANSETRON HCL 4 MG PO TABS: 4.0000 mg | ORAL_TABLET | Freq: Four times a day (QID) | ORAL | Status: DC | PRN

## 2021-04-02 MED ORDER — TRAZODONE HCL 100 MG PO TABS
100.0000 mg | ORAL_TABLET | Freq: Every day | ORAL | Status: DC
  Administered 2021-04-03 – 2021-04-04 (×3): 100 mg via ORAL
  Filled 2021-04-02 (×3): qty 1

## 2021-04-02 MED ORDER — LEVOFLOXACIN IN D5W 750 MG/150ML IV SOLN
750.0000 mg | Freq: Once | INTRAVENOUS | Status: AC
  Administered 2021-04-03: 750 mg via INTRAVENOUS
  Filled 2021-04-02: qty 150

## 2021-04-02 MED ORDER — IPRATROPIUM-ALBUTEROL 0.5-2.5 (3) MG/3ML IN SOLN
3.0000 mL | Freq: Four times a day (QID) | RESPIRATORY_TRACT | Status: DC
  Administered 2021-04-03 – 2021-04-05 (×9): 3 mL via RESPIRATORY_TRACT
  Filled 2021-04-02 (×9): qty 3

## 2021-04-02 MED ORDER — ACETAMINOPHEN 325 MG PO TABS
650.0000 mg | ORAL_TABLET | Freq: Four times a day (QID) | ORAL | Status: DC | PRN
  Administered 2021-04-04: 650 mg via ORAL
  Filled 2021-04-02: qty 2

## 2021-04-02 MED ORDER — ATORVASTATIN CALCIUM 20 MG PO TABS
20.0000 mg | ORAL_TABLET | Freq: Every day | ORAL | Status: DC
  Administered 2021-04-03 – 2021-04-05 (×3): 20 mg via ORAL
  Filled 2021-04-02 (×3): qty 1

## 2021-04-02 MED ORDER — PREDNISONE 20 MG PO TABS: 40.0000 mg | ORAL_TABLET | Freq: Every day | ORAL | Status: DC

## 2021-04-02 MED ORDER — METHYLPREDNISOLONE SODIUM SUCC 125 MG IJ SOLR
125.0000 mg | INTRAMUSCULAR | Status: AC
  Administered 2021-04-02: 125 mg via INTRAVENOUS
  Filled 2021-04-02: qty 2

## 2021-04-02 MED ORDER — PREGABALIN 75 MG PO CAPS
150.0000 mg | ORAL_CAPSULE | Freq: Every day | ORAL | Status: DC
  Administered 2021-04-03 – 2021-04-04 (×3): 150 mg via ORAL
  Filled 2021-04-02 (×3): qty 2

## 2021-04-02 MED ORDER — INSULIN ASPART 100 UNIT/ML IJ SOLN
0.0000 [IU] | Freq: Every day | INTRAMUSCULAR | Status: DC
  Administered 2021-04-03 – 2021-04-04 (×3): 3 [IU] via SUBCUTANEOUS
  Filled 2021-04-02 (×3): qty 1

## 2021-04-02 MED ORDER — INSULIN ASPART 100 UNIT/ML IJ SOLN
0.0000 [IU] | Freq: Three times a day (TID) | INTRAMUSCULAR | Status: DC
  Administered 2021-04-03: 7 [IU] via SUBCUTANEOUS
  Administered 2021-04-03: 20 [IU] via SUBCUTANEOUS
  Administered 2021-04-04: 4 [IU] via SUBCUTANEOUS
  Administered 2021-04-04: 11 [IU] via SUBCUTANEOUS
  Administered 2021-04-04: 15 [IU] via SUBCUTANEOUS
  Administered 2021-04-05: 11 [IU] via SUBCUTANEOUS
  Filled 2021-04-02 (×6): qty 1

## 2021-04-02 MED ORDER — ONDANSETRON HCL 4 MG/2ML IJ SOLN: 4.0000 mg | Freq: Four times a day (QID) | INTRAMUSCULAR | Status: DC | PRN

## 2021-04-02 MED ORDER — ACETAMINOPHEN 650 MG RE SUPP: 650.0000 mg | Freq: Four times a day (QID) | RECTAL | Status: DC | PRN

## 2021-04-02 MED ORDER — QUETIAPINE FUMARATE 25 MG PO TABS
50.0000 mg | ORAL_TABLET | Freq: Every day | ORAL | Status: DC
  Administered 2021-04-03 – 2021-04-04 (×3): 50 mg via ORAL
  Filled 2021-04-02 (×3): qty 2

## 2021-04-02 MED ORDER — VENLAFAXINE HCL ER 150 MG PO CP24
150.0000 mg | ORAL_CAPSULE | Freq: Every day | ORAL | Status: DC
  Administered 2021-04-03 – 2021-04-05 (×3): 150 mg via ORAL
  Filled 2021-04-02 (×3): qty 1

## 2021-04-02 MED ORDER — BUPROPION HCL ER (XL) 150 MG PO TB24
150.0000 mg | ORAL_TABLET | Freq: Every day | ORAL | Status: DC
  Administered 2021-04-03 – 2021-04-05 (×3): 150 mg via ORAL
  Filled 2021-04-02 (×3): qty 1

## 2021-04-02 MED ORDER — PANTOPRAZOLE SODIUM 40 MG PO TBEC
40.0000 mg | DELAYED_RELEASE_TABLET | Freq: Every day | ORAL | Status: DC
Start: 2021-04-03 — End: 2021-04-05
  Administered 2021-04-03 – 2021-04-05 (×3): 40 mg via ORAL
  Filled 2021-04-02 (×3): qty 1

## 2021-04-02 MED ORDER — BUSPIRONE HCL 10 MG PO TABS
5.0000 mg | ORAL_TABLET | Freq: Every day | ORAL | Status: DC
  Administered 2021-04-03 – 2021-04-04 (×3): 5 mg via ORAL
  Filled 2021-04-02 (×3): qty 1

## 2021-04-02 MED ORDER — SODIUM CHLORIDE 0.9 % IV SOLN
1.0000 g | INTRAVENOUS | Status: DC
  Administered 2021-04-03: 1 g via INTRAVENOUS
  Filled 2021-04-02 (×2): qty 10

## 2021-04-02 MED ORDER — ALBUTEROL SULFATE (2.5 MG/3ML) 0.083% IN NEBU
5.0000 mg | INHALATION_SOLUTION | Freq: Once | RESPIRATORY_TRACT | Status: AC
  Administered 2021-04-02: 5 mg via RESPIRATORY_TRACT
  Filled 2021-04-02: qty 6

## 2021-04-02 MED ORDER — ROFLUMILAST 500 MCG PO TABS
500.0000 ug | ORAL_TABLET | Freq: Every day | ORAL | Status: DC
  Administered 2021-04-03 – 2021-04-05 (×3): 500 ug via ORAL
  Filled 2021-04-02 (×3): qty 1

## 2021-04-02 NOTE — Assessment & Plan Note (Signed)
Continue Wellbutrin, trazodone and BuSpar

## 2021-04-02 NOTE — Assessment & Plan Note (Signed)
Schedule and as needed nebulized bronchodilator treatments IV steroids IV Rocephin Supplemental O2 to keep sats over 90

## 2021-04-02 NOTE — H&P (Signed)
History and Physical    Patient: Joan [name] NFA:[id number] DOB: 1960/11/28 DOA: 04/02/2021 DOS: the patient was seen and examined on 04/02/2021 PCP: Myrlene Broker, MD  Patient coming from: Home  Chief Complaint:  Chief Complaint  Patient presents with   Chest Pain    HPI: Joan [name] is a 61 y.o. female with medical history significant of COPD on home O2 at 3 L, OSA, DM, HTN, hypothyroidism, mood disorder, generalized osteoarthritis s/p bilateral hip arthroplasty who presents to the ED with shortness of breath and chest tightness typical for her COPD exacerbations, not responding to home bronchodilator treatments.  She also has a productive cough but no fever or chills.  Denies lower extremity pain or swelling.  Denies nausea, vomiting, abdominal pain or diarrhea  ED course: Mildly tachycardic to 101 in the ED and mild tachypnea to 22 with otherwise unremarkable vitals.  O2 sat 98 to 100% on home flow rate of 3 L Blood work: Notable for normal WBC of 9, hemoglobin 11, potassium 2.8 and troponin of 5  EKG, personally viewed and interpreted: NSR at 67 with no acute ST-T wave changes  Chest x-ray findings of COPD.  No acute cardiopulmonary disease  Patient treated with multiple rounds of DuoNebs as well as IV steroids.  Continued to have increased work of breathing and wheezing.  Hospitalist consulted for admission.   Review of Systems: As mentioned in the history of present illness. All other systems reviewed and are negative. Past Medical History:  Diagnosis Date   Acute respiratory failure with hypoxia (HCC)    Alcoholism and drug addiction in family    Anxiety    Appendicitis    Arthritis    "knees, hips, lower back" (09/25/2016)   Asthma    Bipolar disorder (HCC)    Chronic bronchitis (HCC)    "all the time" (09/25/2016)   Chronic leg pain    RLE   Chronic lower back pain    COPD (chronic obstructive pulmonary disease) (HCC)    Depression    Diabetes  mellitus without complication (HCC)    Diabetic neuropathy (HCC)    Early cataracts, bilateral    Emphysema of lung (HCC)    Generalized anxiety disorder 02/09/2020   GERD (gastroesophageal reflux disease)    Headache    Hepatitis C    "treated back in 2001-2002"   High cholesterol    Hypertension    Hypothyroidism    Incarcerated ventral hernia 04/04/2017   Mood disorder in conditions classified elsewhere 02/09/2020   On home oxygen therapy    "2L; at nighttime" (04/04/2017)   OSA on CPAP    CPAP with Oxygen 2 liters   Paranoid (HCC)    Pneumonia    "all the time" (09/25/2016)   Positive PPD    with negative chest x ray   PTSD (post-traumatic stress disorder) 02/09/2020   Tachycardia    patient reports with exertion ; denies any other conditions    Thyroid disease    Wears glasses    Wears partial dentures    Past Surgical History:  Procedure Laterality Date   BACK SURGERY     BIOPSY  11/24/2018   Procedure: BIOPSY;  Surgeon: Tressia Danas, MD;  Location: [facility];  Service: Gastroenterology;;   CARDIAC CATHETERIZATION     COLONOSCOPY     COLONOSCOPY WITH PROPOFOL N/A 11/24/2018   Procedure: COLONOSCOPY WITH PROPOFOL;  Surgeon: Tressia Danas, MD;  Location: [facility];  Service: Gastroenterology;  Laterality:  N/A;   CYST EXCISION     removal of cyst in sinuses   DILATION AND CURETTAGE OF UTERUS     ESOPHAGOGASTRODUODENOSCOPY (EGD) WITH PROPOFOL N/A 11/24/2018   Procedure: ESOPHAGOGASTRODUODENOSCOPY (EGD) WITH PROPOFOL;  Surgeon: Tressia Danas, MD;  Location: [facility];  Service: Gastroenterology;  Laterality: N/A;   FOOT SURGERY Bilateral    bone removed from 4th and 5 th toes and put in pin then took pin out   HERNIA REPAIR     INCISION AND DRAINAGE  ~ 2014/2015   "removed mesh & infection"   INCISIONAL HERNIA REPAIR N/A 04/04/2017   Procedure: LAPAROSCOPIC INCISIONAL HERNIA REPAIR WITH MESH;  Surgeon: Claud Kelp, MD;  Location: [facility] OR;  Service:  General;  Laterality: N/A;   LACERATION REPAIR Right    repair wrist artery from laceration from ice skate   LAPAROSCOPIC APPENDECTOMY N/A 03/31/2019   Procedure: APPENDECTOMY LAPAROSCOPIC;  Surgeon: Axel Filler, MD;  Location: [facility];  Service: General;  Laterality: N/A;   LAPAROSCOPIC APPENDECTOMY N/A 07/09/2019   Procedure: APPENDECTOMY LAPAROSCOPIC;  Surgeon: Axel Filler, MD;  Location: [facility] OR;  Service: General;  Laterality: N/A;   LAPAROSCOPIC INCISIONAL / UMBILICAL / VENTRAL HERNIA REPAIR  04/04/2017   LAPAROSCOPIC INCISIONAL HERNIA REPAIR WITH MESH   LIPOMA EXCISION Right    fattty lipoma in neck   NASAL SEPTUM SURGERY     POSTERIOR LUMBAR FUSION  2015/2016 X 2   "added rods and screws"   SINOSCOPY     TOTAL HIP ARTHROPLASTY Right 02/05/2017   Procedure: RIGHT TOTAL HIP ARTHROPLASTY;  Surgeon: Ranee Gosselin, MD;  Location: [facility];  Service: Orthopedics;  Laterality: Right;   TOTAL HIP ARTHROPLASTY Left 06/18/2017   Procedure: LEFT TOTAL HIP ARTHROPLASTY;  Surgeon: Ranee Gosselin, MD;  Location: [facility];  Service: Orthopedics;  Laterality: Left;   TOTAL HIP REVISION Left 01/26/2019   Procedure: TOTAL HIP REVISION;  Surgeon: Durene Romans, MD;  Location: [facility];  Service: Orthopedics;  Laterality: Left;  2 hrs   TUBAL LIGATION     UMBILICAL HERNIA REPAIR  ~ 2013   w/mesh   [facility] ENDOS[facility]     Social History:  reports that she has been smoking cigarettes. She has a 22.00 pack-year smoking history. She has never used smokeless tobacco. She reports that she does not currently use alcohol. She reports that she does not currently use drugs after having used the following drugs: "Crack" cocaine.  Allergies  Allergen Reactions   Keflex [Cephalexin] Rash and Other (See Comments)    Has tolerated amoxicillin since had keflex   Prednisone Other (See Comments)    "counter reacts"   Shellfish Allergy Shortness Of Breath, Nausea And Vomiting and Other (See Comments)    Stomach  cramps   Tetracyclines & Related Anaphylaxis, Swelling and Other (See Comments)    Throat swelling requiring hospitalization   Dilaudid [Hydromorphone Hcl] Other (See Comments)    "Lethargy"   Incruse Ellipta [Umeclidinium Bromide] Nausea Only   Other    Tuberculin Tests Other (See Comments)    False postive    Family History  Problem Relation Age of Onset   Diabetes Mother    Hypertension Mother    Hypertension Father    Cancer Father        lung    Prior to Admission medications   Medication Sig Start Date End Date Taking? Authorizing Provider  acyclovir (ZOVIRAX) 400 MG tablet TAKE 1 TABLET BY MOUTH  TWICE DAILY Patient taking  differently: 400 mg daily. 11/10/20  Yes Myrlene Broker, MD  albuterol (PROVENTIL) (2.5 MG/3ML) 0.083% nebulizer solution Take 3 mLs (2.5 mg total) by nebulization 2 (two) times daily. And every 6 hours as needed.  DX: J44.9 01/05/21  Yes Bevelyn Ngo, NP  atenolol (TENORMIN) 25 MG tablet Take 25 mg by mouth daily. 09/10/20  Yes [provider]  atorvastatin (LIPITOR) 20 MG tablet TAKE 1 TABLET BY MOUTH  DAILY 01/17/21  Yes Myrlene Broker, MD  Budeson-Glycopyrrol-Formoterol (BREZTRI AEROSPHERE) 160-9-4.8 MCG/ACT AERO Inhale 2 puffs into the lungs in the morning and at bedtime. 02/13/21  Yes Oretha Milch, MD  buPROPion (WELLBUTRIN XL) 150 MG 24 hr tablet TAKE 1 TABLET BY MOUTH  DAILY 01/17/21  Yes Thresa Ross, MD  busPIRone (BUSPAR) 5 MG tablet TAKE 1/2 TO 1 TABLET BY MOUTH  TWICE DAILY AS NEEDED Patient taking differently: Take 5 mg by mouth at bedtime. 01/17/21  Yes Thresa Ross, MD  Camphor-Menthol-Methyl Sal (SALONPAS) 3.03-02-08 % PTCH Apply 1 patch topically daily as needed.   Yes [provider]  cyclobenzaprine (FLEXERIL) 10 MG tablet Take 10 mg by mouth at bedtime. 11/09/20  Yes [provider]  hydrochlorothiazide (HYDRODIURIL) 25 MG tablet TAKE 1 TABLET BY MOUTH  DAILY 01/17/21  Yes Myrlene Broker, MD  Ipratropium-Albuterol (COMBIVENT RESPIMAT) 20-100 MCG/ACT AERS respimat Inhale 1 puff into the lungs every 6 (six) hours as needed for wheezing or shortness of breath. 03/02/21  Yes Oretha Milch, MD  levothyroxine (SYNTHROID) 25 MCG tablet TAKE 1 TABLET BY MOUTH  DAILY BEFORE BREAKFAST 01/17/21  Yes Myrlene Broker, MD  Liniments [facility] ARTHRITIS PAIN RELIEF EX) Apply 1 patch topically daily as needed (to painful sites- remove as directed).    Yes [provider]  montelukast (SINGULAIR) 10 MG tablet TAKE 1 TABLET BY MOUTH AT  BEDTIME 01/17/21  Yes Oretha Milch, MD  nystatin-triamcinolone ointment [facility]) Apply 1 application topically 2 (two) times daily as needed (applied to skin folds/groins/under breast skin irritation rash.). 09/08/17  Yes Myrlene Broker, MD  OXYGEN Inhale 3 L/min into the lungs See admin instructions. Inhale  3 L/min of oxygen into the lungs w/CPAP at bedtime and as needed for shortness of breath with "strenuous activity" during the day   Yes [provider]  pantoprazole (PROTONIX) 40 MG tablet TAKE 1 TABLET BY MOUTH  DAILY 01/17/21  Yes Myrlene Broker, MD  PATADAY 0.2 % SOLN Apply 1 drop to eye daily. 08/20/19  Yes [provider]  polyethylene glycol (MIRALAX / GLYCOLAX) 17 g packet Take 17 g by mouth daily as needed for mild constipation. 04/14/19  Yes Meuth, Brooke A, PA-C  pregabalin (LYRICA) 150 MG capsule Take 1 capsule (150 mg total) by mouth at bedtime. 03/27/21  Yes Myrlene Broker, MD  QUEtiapine (SEROQUEL) 50 MG tablet TAKE 1 TABLET BY MOUTH AT  BEDTIME 01/17/21  Yes Thresa Ross, MD  roflumilast (DALIRESP) 500 MCG TABS tablet Take 1 tablet (500 mcg total) by mouth daily. 11/06/18  Yes Bevelyn Ngo, NP  traZODone (DESYREL) 100 MG tablet TAKE 1 TABLET BY MOUTH AT  BEDTIME 01/17/21  Yes Myrlene Broker, MD  varenicline (CHANTIX) 1 MG tablet Take 1 tablet (1 mg total) by mouth 2 (two) times daily.  For smoking cessation. 03/18/21 04/17/21 Yes Darlin Priestly, MD  venlafaxine XR (EFFEXOR-XR) 150 MG 24 hr capsule TAKE 1 CAPSULE BY MOUTH  DAILY WITH BREAKFAST 01/17/21  Yes Myrlene Broker, MD  witch Shawnn Bouillon-glycerin (TUCKS) pad Apply topically as needed for itching. 08/18/20  Yes Standley Brooking, MD    Physical Exam: Vitals:   04/02/21 2000 04/02/21 2115 04/02/21 2200 04/02/21 2300  BP: 127/90 (!) 153/88 139/85 (!) 135/101  Pulse: 64 68 65 68  Resp: (!) 22 18 17 19   Temp:      SpO2: 100% 98% 100% 94%   Physical Exam Vitals and nursing note reviewed.  Constitutional:      Appearance: Normal appearance. She is obese.  HENT:     Head: Normocephalic and atraumatic.  Cardiovascular:     Rate and Rhythm: Normal rate and regular rhythm.     Pulses: Normal pulses.     Heart sounds: Normal heart sounds. No murmur heard. Pulmonary:     Effort: Tachypnea and prolonged expiration present.     Breath sounds: Normal breath sounds. Decreased air movement present. No wheezing or rhonchi.  Abdominal:     General: Bowel sounds are normal.     Palpations: Abdomen is soft.     Tenderness: There is no abdominal tenderness.  Musculoskeletal:        General: No swelling or tenderness. Normal range of motion.     Cervical back: Normal range of motion and neck supple.  Skin:    General: Skin is warm and dry.  Neurological:     General: No focal deficit present.     Mental Status: She is alert. Mental status is at baseline.  Psychiatric:        Mood and Affect: Mood normal.        Behavior: Behavior normal.     Data Reviewed: Notes from primary care and specialist visits, past discharge summaries. Prior diagnostic testing as applicable to current admission diagnoses Updated medications and problem lists for reconciliation ED course, including vitals, labs, imaging, treatment and response to treatment Triage notes and ED providers notes   Assessment and Plan: * COPD with acute  exacerbation (HCC)- (present on admission) Schedule and as needed nebulized bronchodilator treatments IV steroids IV Rocephin Supplemental O2 to keep sats over 90  Chronic respiratory failure with hypoxia (HCC)- (present on admission) O2 requirements unchanged from baseline Titrate as needed  Hypokalemia Oral repletion and monitor   OSA on CPAP CPAP at night if desired by patient  Diabetes mellitus, type II (HCC) Sliding scale insulin coverage  Hypothyroidism Continue levothyroxine  Mood disorder in conditions classified elsewhere Continue Wellbutrin, trazodone and BuSpar  Osteoarthritis- (present on admission) Continue Tylenol and cyclobenzaprine  Essential hypertension- (present on admission) Continue home hydrochlorothiazide and atenolol   Advance Care Planning:   Code Status: Full Code   Consults: none  Family Communication: none  Severity of Illness: The appropriate patient status for this patient is OBSERVATION. Observation status is judged to be reasonable and necessary in order to provide the required intensity of service to ensure the patient's safety. The patient's presenting symptoms, physical exam findings, and initial radiographic and laboratory data in the context of their medical condition is felt to place them at decreased risk for further clinical deterioration. Furthermore, it is anticipated that the patient will be medically stable for discharge from the hospital within 2 midnights of admission.   Author: Andris Baumann, MD 04/02/2021 11:53 PM  For on call review [email or web address].

## 2021-04-02 NOTE — Assessment & Plan Note (Signed)
CPAP at night if desired by patient

## 2021-04-02 NOTE — Assessment & Plan Note (Signed)
Continue Tylenol and cyclobenzaprine

## 2021-04-02 NOTE — ED Triage Notes (Signed)
Pt comes via EMs from home with c/o SOB and chest tightness. Pt states this all started a year ago. Pt states her therapist advised her to come get checked out.  VSS, SR  [id number]  Pt has productive cough.

## 2021-04-02 NOTE — ED Notes (Signed)
Patient provided sandwich and a coke per MD ok. IV team at the bedside

## 2021-04-02 NOTE — Assessment & Plan Note (Signed)
O2 requirements unchanged from baseline Titrate as needed

## 2021-04-02 NOTE — Assessment & Plan Note (Signed)
Continue levothyroxine 

## 2021-04-02 NOTE — ED Provider Notes (Addendum)
[facility] Provider Note    Event Date/Time   First MD Initiated Contact with Patient 04/02/21 1935     (approximate)   History   Chest Pain   HPI  Adalyne Lovick is a 61 y.o. female with a history of COPD, on 3 L nasal cannula at all times.  CAD who comes ED complaining of shortness of breath, chest tightness, productive cough for the past several days.  No fever.  Not pleuritic.  Does get very short of breath with trying to walk at home.  Denies acute lower extremity pain or swelling.     Physical Exam   Triage Vital Signs: ED Triage Vitals  Enc Vitals Group     BP 04/02/21 1805 135/75     Pulse Rate 04/02/21 1805 (!) 101     Resp 04/02/21 1805 18     Temp 04/02/21 1805 98 F (36.7 C)     Temp src --      SpO2 04/02/21 1805 98 %     Weight --      Height --      Head Circumference --      Peak Flow --      Pain Score 04/02/21 1746 6     Pain Loc --      Pain Edu? --      Excl. in [place]? --     Most recent vital signs: Vitals:   04/02/21 2115 04/02/21 2200  BP: (!) 153/88 139/85  Pulse: 68 65  Resp: 18 17  Temp:    SpO2: 98% 100%     General: Awake, no distress.  CV:  Good peripheral perfusion.  Regular rate and rhythm Resp:  Normal effort.  Tachypnea with respiratory rate of 22.  Diffuse expiratory wheezing and prolonged expiratory phase.  No focal crackles Abd:  No distention.  Soft and nontender Other:  Bilateral lower extremity swelling, symmetric.  No calf tenderness.  No inflammatory changes.   ED Results / Procedures / Treatments   Labs (all labs ordered are listed, but only abnormal results are displayed) Labs Reviewed  BASIC METABOLIC PANEL - Abnormal; Notable for the following components:      Result Value   Potassium 2.8 (*)    Chloride 93 (*)    Glucose, Bld 108 (*)    Calcium 8.6 (*)    All other components within normal limits  CBC - Abnormal; Notable for the following components:   RBC 3.79 (*)     Hemoglobin 11.0 (*)    HCT 34.1 (*)    All other components within normal limits  POC URINE PREG, ED  TROPONIN I (HIGH SENSITIVITY)     EKG  Interpreted by me Normal sinus rhythm rate of 67.  Normal axis and intervals.  Poor R wave progression.  Normal ST segments and T waves.   RADIOLOGY Chest x-ray viewed and interpreted by me, appears unremarkable, shows evidence of underlying COPD.  Radiology report reviewed    PROCEDURES:  Critical Care performed: No  .1-3 Lead EKG Interpretation Performed by: Carrie Mew, MD Authorized by: Carrie Mew, MD     Interpretation: normal     ECG rate:  65   ECG rate assessment: normal     Rhythm: sinus rhythm     Ectopy: none     Conduction: normal     MEDICATIONS ORDERED IN ED: Medications  ipratropium-albuterol (DUONEB) 0.5-2.5 (3) MG/3ML nebulizer solution 9 mL (9 mLs Nebulization  Given 04/02/21 2000)  methylPREDNISolone sodium succinate (SOLU-MEDROL) 125 mg/2 mL injection 125 mg (125 mg Intravenous Given 04/02/21 2112)  magnesium sulfate IVPB 2 g 50 mL (0 g Intravenous Stopped 04/02/21 2246)  albuterol (PROVENTIL) (2.5 MG/3ML) 0.083% nebulizer solution 5 mg (5 mg Nebulization Given 04/02/21 2156)     IMPRESSION / MDM / ASSESSMENT AND PLAN / ED COURSE  I reviewed the triage vital signs and the nursing notes.                              Differential diagnosis includes, but is not limited to, COPD exacerbation, pneumonia, pleural effusion, pulmonary edema  The patient is on the cardiac monitor to evaluate for evidence of arrhythmia and/or significant heart rate changes. Patient presents with shortness of breath, clinically apparent COPD exacerbation.  Oxygenation is stable on her usual 3 L nasal cannula.  We will give IV Solu-Medrol, 3 DuoNebs and reassess.   Clinical Course as of 04/02/21 2312  Mon Apr 02, 2021  2123 Pt still feels SOB, reports symptoms have not improved.  Still has expiratory wheezin and prolonged  expiratory phase. Will give additional albuterol and IV magnesium. [PS]    Clinical Course User Index [PS] Carrie Mew, MD    ----------------------------------------- 11:12 PM on 04/02/2021 ----------------------------------------- Patient still very wheezy and short of breath.  Still has significantly prolonged expiratory phase and expiratory wheezing on exam.  Other vital signs are normal.  Will hospitalize for further management of COPD exacerbation.   FINAL CLINICAL IMPRESSION(S) / ED DIAGNOSES   Final diagnoses:  COPD with acute exacerbation ([place])  Chronic respiratory failure with hypoxia ([place])     Rx / DC Orders   ED Discharge Orders     None        Note:  This document was prepared using Dragon voice recognition software and may include unintentional dictation errors.   Carrie Mew, MD 04/02/21 2312    Carrie Mew, MD 04/02/21 2312

## 2021-04-02 NOTE — Assessment & Plan Note (Signed)
Sliding scale insulin coverage 

## 2021-04-02 NOTE — ED Notes (Signed)
MD at the bedside  

## 2021-04-02 NOTE — Assessment & Plan Note (Signed)
Continue home hydrochlorothiazide and atenolol

## 2021-04-03 DIAGNOSIS — T380X5A Adverse effect of glucocorticoids and synthetic analogues, initial encounter: Secondary | ICD-10-CM | POA: Diagnosis present

## 2021-04-03 DIAGNOSIS — Z743 Need for continuous supervision: Secondary | ICD-10-CM | POA: Diagnosis not present

## 2021-04-03 DIAGNOSIS — J9611 Chronic respiratory failure with hypoxia: Secondary | ICD-10-CM | POA: Diagnosis not present

## 2021-04-03 DIAGNOSIS — E871 Hypo-osmolality and hyponatremia: Secondary | ICD-10-CM | POA: Diagnosis not present

## 2021-04-03 DIAGNOSIS — J439 Emphysema, unspecified: Secondary | ICD-10-CM | POA: Diagnosis present

## 2021-04-03 DIAGNOSIS — Z888 Allergy status to other drugs, medicaments and biological substances status: Secondary | ICD-10-CM | POA: Diagnosis not present

## 2021-04-03 DIAGNOSIS — E1140 Type 2 diabetes mellitus with diabetic neuropathy, unspecified: Secondary | ICD-10-CM | POA: Diagnosis present

## 2021-04-03 DIAGNOSIS — Z9981 Dependence on supplemental oxygen: Secondary | ICD-10-CM | POA: Diagnosis not present

## 2021-04-03 DIAGNOSIS — Z96643 Presence of artificial hip joint, bilateral: Secondary | ICD-10-CM | POA: Diagnosis present

## 2021-04-03 DIAGNOSIS — E876 Hypokalemia: Secondary | ICD-10-CM | POA: Diagnosis present

## 2021-04-03 DIAGNOSIS — G4733 Obstructive sleep apnea (adult) (pediatric): Secondary | ICD-10-CM | POA: Diagnosis not present

## 2021-04-03 DIAGNOSIS — F319 Bipolar disorder, unspecified: Secondary | ICD-10-CM | POA: Diagnosis present

## 2021-04-03 DIAGNOSIS — E7800 Pure hypercholesterolemia, unspecified: Secondary | ICD-10-CM | POA: Diagnosis present

## 2021-04-03 DIAGNOSIS — I10 Essential (primary) hypertension: Secondary | ICD-10-CM | POA: Diagnosis not present

## 2021-04-03 DIAGNOSIS — Z91013 Allergy to seafood: Secondary | ICD-10-CM | POA: Diagnosis not present

## 2021-04-03 DIAGNOSIS — R5381 Other malaise: Secondary | ICD-10-CM | POA: Diagnosis not present

## 2021-04-03 DIAGNOSIS — I2510 Atherosclerotic heart disease of native coronary artery without angina pectoris: Secondary | ICD-10-CM | POA: Diagnosis present

## 2021-04-03 DIAGNOSIS — Z955 Presence of coronary angioplasty implant and graft: Secondary | ICD-10-CM | POA: Diagnosis not present

## 2021-04-03 DIAGNOSIS — Z23 Encounter for immunization: Secondary | ICD-10-CM | POA: Diagnosis not present

## 2021-04-03 DIAGNOSIS — E039 Hypothyroidism, unspecified: Secondary | ICD-10-CM | POA: Diagnosis present

## 2021-04-03 DIAGNOSIS — R531 Weakness: Secondary | ICD-10-CM | POA: Diagnosis not present

## 2021-04-03 DIAGNOSIS — Z881 Allergy status to other antibiotic agents status: Secondary | ICD-10-CM | POA: Diagnosis not present

## 2021-04-03 DIAGNOSIS — E1165 Type 2 diabetes mellitus with hyperglycemia: Secondary | ICD-10-CM | POA: Diagnosis not present

## 2021-04-03 DIAGNOSIS — Z20822 Contact with and (suspected) exposure to covid-19: Secondary | ICD-10-CM | POA: Diagnosis present

## 2021-04-03 DIAGNOSIS — J441 Chronic obstructive pulmonary disease with (acute) exacerbation: Secondary | ICD-10-CM | POA: Diagnosis not present

## 2021-04-03 DIAGNOSIS — D72829 Elevated white blood cell count, unspecified: Secondary | ICD-10-CM | POA: Diagnosis present

## 2021-04-03 DIAGNOSIS — K219 Gastro-esophageal reflux disease without esophagitis: Secondary | ICD-10-CM | POA: Diagnosis present

## 2021-04-03 DIAGNOSIS — G8929 Other chronic pain: Secondary | ICD-10-CM | POA: Diagnosis present

## 2021-04-03 DIAGNOSIS — Z9989 Dependence on other enabling machines and devices: Secondary | ICD-10-CM | POA: Diagnosis not present

## 2021-04-03 LAB — GLUCOSE, CAPILLARY
Glucose-Capillary: 291 mg/dL — ABNORMAL HIGH (ref 70–99)
Glucose-Capillary: 476 mg/dL — ABNORMAL HIGH (ref 70–99)
Glucose-Capillary: 491 mg/dL — ABNORMAL HIGH (ref 70–99)
Glucose-Capillary: 494 mg/dL — ABNORMAL HIGH (ref 70–99)
Glucose-Capillary: 399 mg/dL — ABNORMAL HIGH (ref 70–99)
Glucose-Capillary: 238 mg/dL — ABNORMAL HIGH (ref 70–99)
Glucose-Capillary: 270 mg/dL — ABNORMAL HIGH (ref 70–99)

## 2021-04-03 MED ORDER — LEVOFLOXACIN 500 MG PO TABS
500.0000 mg | ORAL_TABLET | Freq: Every day | ORAL | Status: DC
  Administered 2021-04-04 – 2021-04-05 (×2): 500 mg via ORAL
  Filled 2021-04-03 (×2): qty 1

## 2021-04-03 MED ORDER — VARENICLINE TARTRATE 0.5 MG PO TABS
1.0000 mg | ORAL_TABLET | Freq: Two times a day (BID) | ORAL | Status: DC
  Administered 2021-04-03 – 2021-04-05 (×6): 1 mg via ORAL
  Filled 2021-04-03 (×8): qty 2

## 2021-04-03 MED ORDER — COVID-19MRNA BIVAL VACC PFIZER 30 MCG/0.3ML IM SUSP
0.3000 mL | Freq: Once | INTRAMUSCULAR | Status: AC
  Administered 2021-04-05: 0.3 mL via INTRAMUSCULAR
  Filled 2021-04-03 (×2): qty 0.3

## 2021-04-03 MED ORDER — METHYLPREDNISOLONE 4 MG PO TBPK
8.0000 mg | ORAL_TABLET | Freq: Every morning | ORAL | Status: DC
  Filled 2021-04-03: qty 21

## 2021-04-03 MED ORDER — METHYLPREDNISOLONE 4 MG PO TBPK
4.0000 mg | ORAL_TABLET | Freq: Three times a day (TID) | ORAL | Status: DC
  Administered 2021-04-04: 4 mg via ORAL

## 2021-04-03 MED ORDER — INSULIN ASPART 100 UNIT/ML IJ SOLN
18.0000 [IU] | Freq: Once | INTRAMUSCULAR | Status: AC
  Administered 2021-04-03: 18 [IU] via SUBCUTANEOUS
  Filled 2021-04-03: qty 1

## 2021-04-03 MED ORDER — METHYLPREDNISOLONE 4 MG PO TBPK
4.0000 mg | ORAL_TABLET | ORAL | Status: AC
  Administered 2021-04-03: 4 mg via ORAL

## 2021-04-03 MED ORDER — METHYLPREDNISOLONE 4 MG PO TBPK: 8.0000 mg | ORAL_TABLET | Freq: Every evening | ORAL | Status: DC

## 2021-04-03 MED ORDER — INSULIN ASPART 100 UNIT/ML IJ SOLN
12.0000 [IU] | Freq: Once | INTRAMUSCULAR | Status: AC
  Administered 2021-04-03: 12 [IU] via SUBCUTANEOUS
  Filled 2021-04-03: qty 1

## 2021-04-03 MED ORDER — POTASSIUM CHLORIDE CRYS ER 20 MEQ PO TBCR
40.0000 meq | EXTENDED_RELEASE_TABLET | Freq: Two times a day (BID) | ORAL | Status: DC
  Administered 2021-04-03 – 2021-04-04 (×3): 40 meq via ORAL
  Filled 2021-04-03 (×3): qty 2

## 2021-04-03 MED ORDER — METHYLPREDNISOLONE 4 MG PO TBPK: 4.0000 mg | ORAL_TABLET | Freq: Four times a day (QID) | ORAL | Status: DC

## 2021-04-03 MED ORDER — INSULIN REGULAR HUMAN 100 UNIT/ML IJ SOLN: 18.0000 [IU] | Freq: Once | INTRAMUSCULAR | Status: DC

## 2021-04-03 MED ORDER — ACYCLOVIR 200 MG PO CAPS
400.0000 mg | ORAL_CAPSULE | Freq: Two times a day (BID) | ORAL | Status: DC
  Administered 2021-04-03 – 2021-04-05 (×5): 400 mg via ORAL
  Filled 2021-04-03 (×6): qty 2

## 2021-04-03 MED ORDER — METHYLPREDNISOLONE 4 MG PO TBPK
8.0000 mg | ORAL_TABLET | Freq: Every evening | ORAL | Status: AC
  Administered 2021-04-03: 8 mg via ORAL

## 2021-04-03 NOTE — Progress Notes (Signed)
PHARMACIST - PHYSICIAN COMMUNICATION  CONCERNING:  Enoxaparin (Lovenox) for DVT Prophylaxis    RECOMMENDATION: Patient was prescribed enoxaprin 40mg  q24 hours for VTE prophylaxis.   There were no vitals filed for this visit.  There is no height or weight on file to calculate BMI.  Estimated Creatinine Clearance: 71.6 mL/min (by C-G formula based on SCr of 1 mg/dL).   Based on [place] patient is candidate for enoxaparin 0.5mg /kg TBW SQ every 24 hours based on BMI being >30.  DESCRIPTION: Pharmacy has adjusted enoxaparin dose per [facility] policy.  Patient is now receiving enoxaparin 0.5 mg/kg every 24 hours   Renda Rolls, PharmD, [facility] 04/03/2021 12:17 AM
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
## 2021-04-03 NOTE — Plan of Care (Signed)
°  Problem: Education: Goal: Knowledge of disease or condition will improve 04/03/2021 0146 by Noah Delaine, RN Outcome: Progressing 04/03/2021 0146 by Noah Delaine, RN Outcome: Progressing   Problem: Activity: Goal: Ability to tolerate increased activity will improve Outcome: Progressing   Problem: Respiratory: Goal: Ability to maintain a clear airway will improve Outcome: Progressing   Problem: Education: Goal: Knowledge of General Education information will improve Description: Including pain rating scale, medication(s)/side effects and non-pharmacologic comfort measures Outcome: Progressing   Problem: Pain Managment: Goal: General experience of comfort will improve Outcome: Progressing   Problem: Safety: Goal: Ability to remain free from injury will improve Outcome: Progressing

## 2021-04-03 NOTE — Assessment & Plan Note (Signed)
Oral repletion and monitor 

## 2021-04-03 NOTE — Progress Notes (Signed)
Inpatient Diabetes Program Recommendations  AACE/ADA: New Consensus Statement on Inpatient Glycemic Control   Target Ranges:  Prepandial:   less than 140 mg/dL      Peak postprandial:   less than 180 mg/dL (1-2 hours)      Critically ill patients:  140 - 180 mg/dL    Latest Reference Range & Units 04/03/21 01:13 04/03/21 07:45 04/03/21 08:45 04/03/21 09:58 04/03/21 10:35  Glucose-Capillary 70 - 99 mg/dL 291 (H) 476 (H) 491 (H)  Novolog 18 units @9 :06 494 (H)   Novolog 12 units @ 10:35   Review of Glycemic Control  Diabetes history: DM2 Outpatient Diabetes medications: None Current orders for Inpatient glycemic control: Novolog 0-20 units TID with meals, Novolog 0-5 units QHS; Medrol dose pack)  Inpatient Diabetes Program Recommendations:    Insulin: If steroids are continued, please consider Semglee 5 units Q25H and ordering Novolog 5 units TID with meals for meal coverage if patient eats at least 50% of meals.  Thanks, Barnie Alderman, RN, MSN, CDE Diabetes Coordinator Inpatient Diabetes Program [phone] (Team Pager from 8am to 5pm)

## 2021-04-03 NOTE — ED Notes (Signed)
Hospitalist at the bedside 

## 2021-04-03 NOTE — TOC Progression Note (Addendum)
Transition of Care [facility]) - Progression Note    Patient Details  Name: Shacola Schussler MRN: [id number] Date of Birth: 1960-11-19  Transition of Care [facility]) CM/SW [place], RN Phone Number: 04/03/2021, 1:17 PM  Clinical Narrative:     The patient is open with [facility] for Rn, PT and OT will need Orders to continue care    Transition of Care [facility] Screening Note   Patient Details  Name: Jennye Runquist Date of Birth: 09-14-1960   Transition of Care [facility]) CM/SW Contact:    Conception Oms, RN Phone Number: 04/03/2021, 2:01 PM    Transition of Care Department [facility]) has reviewed patient and no TOC needs have been identified at this time. We will continue to monitor patient advancement through interdisciplinary progression rounds. If new patient transition needs arise, please place a TOC consult.      Expected Discharge Plan and Services                                                 Social Determinants of Health (SDOH) Interventions    Readmission Risk Interventions Readmission Risk Prevention Plan 04/07/2019  Transportation Screening Complete  PCP or Specialist Appt within 3-5 Days Complete  HRI or [place] Complete  Social Work Consult for [place] Planning/Counseling Complete  Palliative Care Screening Not Applicable  Medication Review [employer]) Complete  Some recent data might be hidden

## 2021-04-03 NOTE — Plan of Care (Signed)
°  Problem: Education: Goal: Knowledge of disease or condition will improve Outcome: Progressing   Problem: Activity: Goal: Ability to tolerate increased activity will improve Outcome: Progressing   Problem: Respiratory: Goal: Ability to maintain a clear airway will improve Outcome: Progressing   Problem: Education: Goal: Knowledge of General Education information will improve Description: Including pain rating scale, medication(s)/side effects and non-pharmacologic comfort measures Outcome: Progressing   Problem: Activity: Goal: Risk for activity intolerance will decrease Outcome: Progressing   Problem: Pain Managment: Goal: General experience of comfort will improve Outcome: Progressing   Problem: Safety: Goal: Ability to remain free from injury will improve Outcome: Progressing   Problem: Skin Integrity: Goal: Risk for impaired skin integrity will decrease Outcome: Progressing

## 2021-04-04 ENCOUNTER — Encounter: Payer: Self-pay | Admitting: Family Medicine

## 2021-04-04 DIAGNOSIS — I10 Essential (primary) hypertension: Secondary | ICD-10-CM

## 2021-04-04 DIAGNOSIS — E1165 Type 2 diabetes mellitus with hyperglycemia: Secondary | ICD-10-CM

## 2021-04-04 DIAGNOSIS — J9611 Chronic respiratory failure with hypoxia: Secondary | ICD-10-CM

## 2021-04-04 DIAGNOSIS — G4733 Obstructive sleep apnea (adult) (pediatric): Secondary | ICD-10-CM

## 2021-04-04 DIAGNOSIS — Z9989 Dependence on other enabling machines and devices: Secondary | ICD-10-CM

## 2021-04-04 LAB — CBC WITH DIFFERENTIAL/PLATELET
Abs Immature Granulocytes: 0.12 10*3/uL — ABNORMAL HIGH (ref 0.00–0.07)
Basophils Absolute: 0 10*3/uL (ref 0.0–0.1)
Basophils Relative: 0 %
Eosinophils Absolute: 0 10*3/uL (ref 0.0–0.5)
Eosinophils Relative: 0 %
HCT: 36.1 % (ref 36.0–46.0)
Hemoglobin: 11.6 g/dL — ABNORMAL LOW (ref 12.0–15.0)
Immature Granulocytes: 1 %
Lymphocytes Relative: 7 %
Lymphs Abs: 1.1 10*3/uL (ref 0.7–4.0)
MCH: 29.1 pg (ref 26.0–34.0)
MCHC: 32.1 g/dL (ref 30.0–36.0)
MCV: 90.7 fL (ref 80.0–100.0)
Monocytes Absolute: 0.8 10*3/uL (ref 0.1–1.0)
Monocytes Relative: 5 %
Neutro Abs: 13.4 10*3/uL — ABNORMAL HIGH (ref 1.7–7.7)
Neutrophils Relative %: 87 %
Platelets: 279 10*3/uL (ref 150–400)
RBC: 3.98 MIL/uL (ref 3.87–5.11)
RDW: 14.6 % (ref 11.5–15.5)
WBC: 15.4 10*3/uL — ABNORMAL HIGH (ref 4.0–10.5)
nRBC: 0 % (ref 0.0–0.2)

## 2021-04-04 LAB — BASIC METABOLIC PANEL
Anion gap: 10 (ref 5–15)
BUN: 20 mg/dL (ref 6–20)
CO2: 30 mmol/L (ref 22–32)
Calcium: 9.3 mg/dL (ref 8.9–10.3)
Chloride: 94 mmol/L — ABNORMAL LOW (ref 98–111)
Creatinine, Ser: 1.02 mg/dL — ABNORMAL HIGH (ref 0.44–1.00)
GFR, Estimated: >60 mL/min (ref >60)
Glucose, Bld: 287 mg/dL — ABNORMAL HIGH (ref 70–99)
Potassium: 4.3 mmol/L (ref 3.5–5.1)
Sodium: 134 mmol/L — ABNORMAL LOW (ref 135–145)

## 2021-04-04 LAB — GLUCOSE, CAPILLARY
Glucose-Capillary: 338 mg/dL — ABNORMAL HIGH (ref 70–99)
Glucose-Capillary: 186 mg/dL — ABNORMAL HIGH (ref 70–99)
Glucose-Capillary: 250 mg/dL — ABNORMAL HIGH (ref 70–99)
Glucose-Capillary: 351 mg/dL — ABNORMAL HIGH (ref 70–99)
Glucose-Capillary: 263 mg/dL — ABNORMAL HIGH (ref 70–99)

## 2021-04-04 MED ORDER — METHYLPREDNISOLONE SODIUM SUCC 125 MG IJ SOLR
60.0000 mg | Freq: Two times a day (BID) | INTRAMUSCULAR | Status: DC
  Administered 2021-04-04 – 2021-04-05 (×3): 60 mg via INTRAVENOUS
  Filled 2021-04-04 (×3): qty 2

## 2021-04-04 MED ORDER — NYSTATIN 100000 UNIT/ML MT SUSP
5.0000 mL | Freq: Four times a day (QID) | OROMUCOSAL | Status: DC
  Administered 2021-04-04 – 2021-04-05 (×5): 500000 [IU] via ORAL
  Filled 2021-04-04 (×5): qty 5

## 2021-04-04 MED ORDER — INSULIN GLARGINE-YFGN 100 UNIT/ML ~~LOC~~ SOLN
5.0000 [IU] | Freq: Every day | SUBCUTANEOUS | Status: DC
  Administered 2021-04-04: 5 [IU] via SUBCUTANEOUS
  Filled 2021-04-04 (×2): qty 0.05

## 2021-04-04 MED ORDER — INSULIN ASPART 100 UNIT/ML IJ SOLN
5.0000 [IU] | Freq: Three times a day (TID) | INTRAMUSCULAR | Status: DC
  Administered 2021-04-04 (×2): 5 [IU] via SUBCUTANEOUS
  Filled 2021-04-04 (×2): qty 1

## 2021-04-04 MED ORDER — MOMETASONE FURO-FORMOTEROL FUM 100-5 MCG/ACT IN AERO
2.0000 | INHALATION_SPRAY | Freq: Two times a day (BID) | RESPIRATORY_TRACT | Status: DC
  Administered 2021-04-04 – 2021-04-05 (×3): 2 via RESPIRATORY_TRACT
  Filled 2021-04-04: qty 8.8

## 2021-04-04 NOTE — Progress Notes (Signed)
Met with the patient provided her with [employer] book, I explained that she has a 3 in 1 and the shower seat is out of pocket at $45, she declines, she is set up with Oxygen at home, she is going to call her [place] to get a list of what she can get

## 2021-04-04 NOTE — Progress Notes (Signed)
PROGRESS NOTE    Joan [name]  HAL:[id number] DOB: September 21, 1960 DOA: 04/02/2021 PCP: [name], MD   Brief Narrative:  61 year old female with history of COPD, chronic hypoxic respiratory failure, OSA on CPAP, CAD status post stenting, chronic pain, bipolar disorder presented with chest pain and shortness of breath.  She was admitted for COPD exacerbation and started on IV Solu-Medrol.  Assessment & Plan:   COPD exacerbation Chronic respiratory failure with hypoxia Ongoing tobacco abuse -Currently on 3 L oxygen via nasal cannula which she normally uses at home.  Still wheezing significantly.  Switch Medrol Dosepak back to IV Solu-Medrol for another day. -Continue nebs.  Outpatient follow-up with pulmonary. -Patient is to stop smoking.  She was counseled regarding cessation by prior hospitalist.  Currently on Chantix.  Chest pain on admission -Possibly from above.  Troponins did not trend up, EKG without any ischemic changes.  No further work-up needed.  Leukocytosis -Possibly reactive from steroid use.  Hypokalemia -Resolved  Mild hyponatremia -Monitor  Diabetes mellitus type 2 with hyperglycemia -Still hyperglycemic.  Start low-dose long-acting insulin.  Start short acting insulin with meals along with CBGs with SSI  Hypertension -Continue atenolol and hydrochlorothiazide  Hyperlipidemia Can continue his statin  Chronic pain/depression -Patient is on multiple medications including bupropion, cyclobenzaprine.  Buspirone, pregabalin, quetiapine, trazodone and venlafaxine.  She is at risk for respiratory depression.  She needs to follow-up with her PCP regarding de-escalation of her medications.  Generalized conditioning -PT eval   DVT prophylaxis: Lovenox Code Status: Full Family Communication: None at bedside Disposition Plan: Status is: Inpatient Remains inpatient appropriate because: Of severity of illness.  Need for IV Solu-Medrol  Consultants:  None  Procedures: None  Antimicrobials: Levaquin   Subjective: Patient seen and examined at bedside.  Feels slightly better but not sure if she is ready to go home today.  Still wheezing.  No fever, vomiting, abdominal pain or chest pain reported.  Objective: Vitals:   04/04/21 0724 04/04/21 0757 04/04/21 0836 04/04/21 0900  BP: 131/76   126/60  Pulse: 76 72  80  Resp: 18 17 19 18   Temp: 98.4 F (36.9 C)   (!) 97.2 F (36.2 C)  TempSrc:    Oral  SpO2: 100% 100%  94%   No intake or output data in the 24 hours ending 04/04/21 1042 There were no vitals filed for this visit.  Examination:  General exam: Appears calm and comfortable.  Looks chronically ill and deconditioned.  Currently on 3 L oxygen via nasal cannula and required CPAP at night. Respiratory system: Bilateral decreased breath sounds at bases with diffuse wheezing Cardiovascular system: S1 & S2 heard, Rate controlled Gastrointestinal system: Abdomen is nondistended, soft and nontender. Normal bowel sounds heard. Extremities: No cyanosis, clubbing; trace lower extremity edema Central nervous system: Alert and oriented.  Slow to respond.  Poor historian.  No focal neurological deficits. Moving extremities Skin: No rashes, lesions or ulcers Psychiatry: Affect is mostly flat.   Data Reviewed: I have personally reviewed following labs and imaging studies  CBC: Recent Labs  Lab 04/02/21 1756 04/04/21 0323  WBC 9.0 15.4*  NEUTROABS  --  13.4*  HGB 11.0* 11.6*  HCT 34.1* 36.1  MCV 90.0 90.7  PLT 273 735   Basic Metabolic Panel: Recent Labs  Lab 04/02/21 1756 04/04/21 0323  NA 135 134*  K 2.8* 4.3  CL 93* 94*  CO2 32 30  GLUCOSE 108* 287*  BUN 10 20  CREATININE 1.00 1.02*  CALCIUM 8.6* 9.3   GFR: Estimated Creatinine Clearance: 70.2 mL/min (A) (by C-G formula based on SCr of 1.02 mg/dL (H)). Liver Function Tests: No results for input(s): AST, ALT, ALKPHOS, BILITOT, PROT, ALBUMIN in the last 168  hours. No results for input(s): LIPASE, AMYLASE in the last 168 hours. No results for input(s): AMMONIA in the last 168 hours. Coagulation Profile: No results for input(s): INR, PROTIME in the last 168 hours. Cardiac Enzymes: No results for input(s): CKTOTAL, CKMB, CKMBINDEX, TROPONINI in the last 168 hours. BNP (last 3 results) No results for input(s): PROBNP in the last 8760 hours. HbA1C: No results for input(s): HGBA1C in the last 72 hours. CBG: Recent Labs  Lab 04/03/21 0958 04/03/21 1105 04/03/21 1730 04/03/21 2112 04/04/21 0726  GLUCAP 494* 399* 238* 270* 338*   Lipid Profile: No results for input(s): CHOL, HDL, LDLCALC, TRIG, CHOLHDL, LDLDIRECT in the last 72 hours. Thyroid Function Tests: No results for input(s): TSH, T4TOTAL, FREET4, T3FREE, THYROIDAB in the last 72 hours. Anemia Panel: No results for input(s): VITAMINB12, FOLATE, FERRITIN, TIBC, IRON, RETICCTPCT in the last 72 hours. Sepsis Labs: No results for input(s): PROCALCITON, LATICACIDVEN in the last 168 hours.  No results found for this or any previous visit (from the past 240 hour(s)).       Radiology Studies: DG Chest 2 View  Result Date: 04/02/2021 CLINICAL DATA:  Chest pain EXAM: CHEST - 2 VIEW COMPARISON:  Radiograph 03/13/2021, chest CT 03/08/2020 FINDINGS: Unchanged cardiomediastinal silhouette. There is no focal airspace consolidation. Mild bronchial wall thickening with pulmonary hyperinflation. Prominent pericardial fat. No pleural effusion. No pneumothorax. No acute osseous abnormality. Thoracic spondylosis. IMPRESSION: No evidence of acute cardiopulmonary disease.  Findings of COPD. Electronically Signed   By: Maurine Simmering M.D.   On: 04/02/2021 18:10        Scheduled Meds:  acyclovir  400 mg Oral BID   atenolol  25 mg Oral Daily   atorvastatin  20 mg Oral Daily   buPROPion  150 mg Oral Daily   busPIRone  5 mg Oral QHS   COVID-19 mRNA bivalent vaccine (Pfizer)  0.3 mL Intramuscular  ONCE-1600   cyclobenzaprine  10 mg Oral QHS   enoxaparin (LOVENOX) injection  0.5 mg/kg Subcutaneous Q24H   hydrochlorothiazide  25 mg Oral Daily   insulin aspart  0-20 Units Subcutaneous TID WC   insulin aspart  0-5 Units Subcutaneous QHS   insulin aspart  5 Units Subcutaneous TID WC   insulin glargine-yfgn  5 Units Subcutaneous Daily   ipratropium-albuterol  3 mL Nebulization Q6H   levofloxacin  500 mg Oral Daily   levothyroxine  25 mcg Oral Q0600   methylPREDNISolone (SOLU-MEDROL) injection  60 mg Intravenous Q12H   nystatin  5 mL Oral QID   pantoprazole  40 mg Oral Daily   potassium chloride  40 mEq Oral BID   pregabalin  150 mg Oral QHS   QUEtiapine  50 mg Oral QHS   roflumilast  500 mcg Oral Daily   traZODone  100 mg Oral QHS   varenicline  1 mg Oral BID   venlafaxine XR  150 mg Oral Q breakfast   Continuous Infusions:        Aline August, MD [facility] 04/04/2021, 10:42 AM

## 2021-04-05 ENCOUNTER — Encounter: Payer: Self-pay | Admitting: Internal Medicine

## 2021-04-05 ENCOUNTER — Ambulatory Visit (HOSPITAL_COMMUNITY): Payer: Medicare Other | Admitting: Licensed Clinical Social Worker

## 2021-04-05 LAB — BASIC METABOLIC PANEL
Anion gap: 10 (ref 5–15)
BUN: 24 mg/dL — ABNORMAL HIGH (ref 6–20)
CO2: 29 mmol/L (ref 22–32)
Calcium: 9.4 mg/dL (ref 8.9–10.3)
Chloride: 94 mmol/L — ABNORMAL LOW (ref 98–111)
Creatinine, Ser: 0.95 mg/dL (ref 0.44–1.00)
GFR, Estimated: >60 mL/min (ref >60)
Glucose, Bld: 343 mg/dL — ABNORMAL HIGH (ref 70–99)
Potassium: 4.9 mmol/L (ref 3.5–5.1)
Sodium: 133 mmol/L — ABNORMAL LOW (ref 135–145)

## 2021-04-05 LAB — GLUCOSE, CAPILLARY
Glucose-Capillary: 402 mg/dL — ABNORMAL HIGH (ref 70–99)
Glucose-Capillary: 267 mg/dL — ABNORMAL HIGH (ref 70–99)
Glucose-Capillary: 103 mg/dL — ABNORMAL HIGH (ref 70–99)

## 2021-04-05 LAB — MAGNESIUM: Magnesium: 2 mg/dL (ref 1.7–2.4)

## 2021-04-05 MED ORDER — METHYLPREDNISOLONE 4 MG PO TBPK: 8.0000 mg | ORAL_TABLET | Freq: Every evening | ORAL | Status: DC

## 2021-04-05 MED ORDER — INSULIN ASPART 100 UNIT/ML IJ SOLN
8.0000 [IU] | Freq: Three times a day (TID) | INTRAMUSCULAR | Status: DC
  Administered 2021-04-05: 8 [IU] via SUBCUTANEOUS
  Filled 2021-04-05: qty 1

## 2021-04-05 MED ORDER — METHYLPREDNISOLONE 4 MG PO TBPK: ORAL_TABLET | ORAL | 0 refills | Status: DC

## 2021-04-05 MED ORDER — METHYLPREDNISOLONE 4 MG PO TBPK: 4.0000 mg | ORAL_TABLET | Freq: Four times a day (QID) | ORAL | Status: DC

## 2021-04-05 MED ORDER — METHYLPREDNISOLONE 4 MG PO TBPK: 4.0000 mg | ORAL_TABLET | ORAL | Status: DC

## 2021-04-05 MED ORDER — NYSTATIN 100000 UNIT/ML MT SUSP: 5.0000 mL | Freq: Four times a day (QID) | OROMUCOSAL | 0 refills | Status: DC

## 2021-04-05 MED ORDER — INSULIN GLARGINE-YFGN 100 UNIT/ML ~~LOC~~ SOLN
10.0000 [IU] | Freq: Every day | SUBCUTANEOUS | Status: DC
  Administered 2021-04-05: 10 [IU] via SUBCUTANEOUS
  Filled 2021-04-05: qty 0.1

## 2021-04-05 MED ORDER — BUSPIRONE HCL 5 MG PO TABS: 5.0000 mg | ORAL_TABLET | Freq: Every evening | ORAL | Status: DC

## 2021-04-05 MED ORDER — METHYLPREDNISOLONE 4 MG PO TBPK
8.0000 mg | ORAL_TABLET | Freq: Every morning | ORAL | Status: DC
  Filled 2021-04-05: qty 21

## 2021-04-05 MED ORDER — METHYLPREDNISOLONE 4 MG PO TBPK: 4.0000 mg | ORAL_TABLET | Freq: Three times a day (TID) | ORAL | Status: DC

## 2021-04-05 NOTE — Discharge Summary (Signed)
Physician Discharge Summary   Patient: Joan [name] MRN: [id number] DOB: 10-29-1960  Admit date:     04/02/2021  Discharge date: 04/05/21  Discharge Physician: Aline August   PCP: [name], MD   Recommendations at discharge:   Outpatient follow-up with PCP within a week Follow-up with pulmonary as an outpatient Comply with medications and follow-up. Advised regarding tobacco cessation Follow-up with the ED if symptoms worsen or new appear  Brief narrative: 61 year old female with history of COPD, chronic hypoxic respiratory failure, OSA on CPAP, CAD status post stenting, chronic pain, bipolar disorder presented with chest pain and shortness of breath.  She was admitted for COPD exacerbation and started on IV Solu-Medrol.  Given the hospitalization, her condition has improved.  Her respiratory status is currently stable and she feels okay to be discharged home.  She will be discharged home today on Medrol Dosepak with outpatient follow-up with PCP and pulmonary.   Discharge diagnosis and Hospital Course:  COPD exacerbation Chronic respiratory failure with hypoxia Ongoing tobacco abuse -Currently on 3 L oxygen via nasal cannula which she normally uses at home.   -Treated with IV Solu-Medrol then subsequently Medrol Dosepak then switched back to IV Solu-Medrol. -Respiratory status currently improved.  Patient feels better enough to go home today.  We will switch back to Medrol Dosepak and discharge patient home today.  Resume home inhaled regimen. -Outpatient follow-up with pulmonary. -Patient needs to stop smoking.  She was counseled regarding cessation.  Currently on Chantix.   Chest pain on admission -Possibly from above.  Troponins did not trend up, EKG without any ischemic changes.  No further work-up needed.   Leukocytosis -Possibly reactive from steroid use.   Hypokalemia -Resolved  Mild hyponatremia -Outpatient follow-up.  Diabetes mellitus type 2  with hyperglycemia -Still hyperglycemic.  Carb modified diet.  Outpatient follow-up with PCP regarding need for medications.  Hypertension -Continue atenolol and hydrochlorothiazide  Hyperlipidemia -continue his statin  Chronic pain/depression -Patient is on multiple medications including bupropion, cyclobenzaprine.  Buspirone, pregabalin, quetiapine, trazodone and venlafaxine.  She is at risk for respiratory depression.  She needs to follow-up with her PCP regarding de-escalation of her medications.   Generalized conditioning -Will need home health PT/OT.  Consultants: None Procedures performed: None Disposition: Home Diet recommendation:  Discharge Diet Orders (From admission, onward)     Start     Ordered   04/05/21 0000  Diet - low sodium heart healthy        04/05/21 1001   04/05/21 0000  Diet Carb Modified        04/05/21 1001           Cardiac and Carb modified diet  DISCHARGE MEDICATION: Allergies as of 04/05/2021       Reactions   Keflex [cephalexin] Rash, Other (See Comments)   Has tolerated amoxicillin since had keflex   Shellfish Allergy Shortness Of Breath, Nausea And Vomiting, Other (See Comments)   Stomach cramps   Tetracyclines & Related Anaphylaxis, Swelling, Other (See Comments)   Throat swelling requiring hospitalization   Dilaudid [hydromorphone Hcl] Other (See Comments)   "Lethargy"   Prednisone Other (See Comments)   "counter reacts", has tolerated IV medrol   Incruse Ellipta [umeclidinium Bromide] Nausea Only   Other    Tuberculin Tests Other (See Comments)   False postive        Medication List     TAKE these medications    acyclovir 400 MG tablet Commonly known as: ZOVIRAX TAKE  1 TABLET BY MOUTH  TWICE DAILY What changed:  how to take this when to take this   albuterol (2.5 MG/3ML) 0.083% nebulizer solution Commonly known as: PROVENTIL Take 3 mLs (2.5 mg total) by nebulization 2 (two) times daily. And every 6 hours as  needed.  DX: J44.9   atenolol 25 MG tablet Commonly known as: TENORMIN Take 25 mg by mouth daily.   atorvastatin 20 MG tablet Commonly known as: LIPITOR TAKE 1 TABLET BY MOUTH  DAILY   Breztri Aerosphere 160-9-4.8 MCG/ACT Aero Generic drug: Budeson-Glycopyrrol-Formoterol Inhale 2 puffs into the lungs in the morning and at bedtime.   buPROPion 150 MG 24 hr tablet Commonly known as: WELLBUTRIN XL TAKE 1 TABLET BY MOUTH  DAILY   busPIRone 5 MG tablet Commonly known as: BUSPAR Take 1 tablet (5 mg total) by mouth at bedtime. What changed: See the new instructions.   Combivent Respimat 20-100 MCG/ACT Aers respimat Generic drug: Ipratropium-Albuterol Inhale 1 puff into the lungs every 6 (six) hours as needed for wheezing or shortness of breath.   cyclobenzaprine 10 MG tablet Commonly known as: FLEXERIL Take 10 mg by mouth at bedtime.   Daliresp 500 MCG Tabs tablet Generic drug: roflumilast Take 1 tablet (500 mcg total) by mouth daily.   hydrochlorothiazide 25 MG tablet Commonly known as: HYDRODIURIL TAKE 1 TABLET BY MOUTH  DAILY   levothyroxine 25 MCG tablet Commonly known as: SYNTHROID TAKE 1 TABLET BY MOUTH  DAILY BEFORE BREAKFAST   methylPREDNISolone 4 MG Tbpk tablet Commonly known as: MEDROL DOSEPAK As per medrol dose pack instructions   montelukast 10 MG tablet Commonly known as: SINGULAIR TAKE 1 TABLET BY MOUTH AT  BEDTIME   nystatin 100000 UNIT/ML suspension Commonly known as: MYCOSTATIN Take 5 mLs (500,000 Units total) by mouth 4 (four) times daily.   nystatin-triamcinolone ointment Commonly known as: MYCOLOG Apply 1 application topically 2 (two) times daily as needed (applied to skin folds/groins/under breast skin irritation rash.).   OXYGEN Inhale 3 L/min into the lungs See admin instructions. Inhale  3 L/min of oxygen into the lungs w/CPAP at bedtime and as needed for shortness of breath with "strenuous activity" during the day   pantoprazole 40 MG  tablet Commonly known as: PROTONIX TAKE 1 TABLET BY MOUTH  DAILY   Pataday 0.2 % Soln Generic drug: Olopatadine HCl Apply 1 drop to eye daily.   polyethylene glycol 17 g packet Commonly known as: MIRALAX / GLYCOLAX Take 17 g by mouth daily as needed for mild constipation.   pregabalin 150 MG capsule Commonly known as: LYRICA Take 1 capsule (150 mg total) by mouth at bedtime.   QUEtiapine 50 MG tablet Commonly known as: SEROQUEL TAKE 1 TABLET BY MOUTH AT  BEDTIME   Salonpas 3.03-02-08 % Ptch Generic drug: Camphor-Menthol-Methyl Sal Apply 1 patch topically daily as needed.   SALONPAS ARTHRITIS PAIN RELIEF EX Apply 1 patch topically daily as needed (to painful sites- remove as directed).   traZODone 100 MG tablet Commonly known as: DESYREL TAKE 1 TABLET BY MOUTH AT  BEDTIME   varenicline 1 MG tablet Commonly known as: CHANTIX Take 1 tablet (1 mg total) by mouth 2 (two) times daily. For smoking cessation.   venlafaxine XR 150 MG 24 hr capsule Commonly known as: EFFEXOR-XR TAKE 1 CAPSULE BY MOUTH  DAILY WITH BREAKFAST   witch hazel-glycerin pad Commonly known as: TUCKS Apply topically as needed for itching.         Subjective Patient seen and  examined at bedside.  Feels better enough to go home today.  Still intermittently coughing with some wheezing.  No fever, chest pain, vomiting reported.  Discharge Exam: There were no vitals filed for this visit. Examination:   General exam: No distress.  Looks chronically ill and deconditioned.  On 3 L oxygen via nasal cannula. Respiratory system: Decreased breath sounds at bases bilaterally with minimal wheezing  cardiovascular system: S1 & S2 heard, Rate controlled Gastrointestinal system: Abdomen is nondistended, soft and nontender. Normal bowel sounds heard. Extremities: Mild lower extremity edema present; no clubbing   condition at discharge: fair  The results of significant diagnostics from this hospitalization  (including imaging, microbiology, ancillary and laboratory) are listed below for reference.   Imaging Studies: DG Chest 2 View  Result Date: 04/02/2021 CLINICAL DATA:  Chest pain EXAM: CHEST - 2 VIEW COMPARISON:  Radiograph 03/13/2021, chest CT 03/08/2020 FINDINGS: Unchanged cardiomediastinal silhouette. There is no focal airspace consolidation. Mild bronchial wall thickening with pulmonary hyperinflation. Prominent pericardial fat. No pleural effusion. No pneumothorax. No acute osseous abnormality. Thoracic spondylosis. IMPRESSION: No evidence of acute cardiopulmonary disease.  Findings of COPD. Electronically Signed   By: Maurine Simmering M.D.   On: 04/02/2021 18:10   DG Chest Port 1 View  Result Date: 03/13/2021 CLINICAL DATA:  Shortness of breath for 2 weeks, initial encounter EXAM: PORTABLE CHEST 1 VIEW COMPARISON:  08/10/2020 FINDINGS: Cardiac shadow is within normal limits. Aortic calcifications are noted. Lungs are mildly hyperinflated. No bony abnormality is seen. IMPRESSION: Mild COPD without acute abnormality. Electronically Signed   By: Inez Catalina M.D.   On: 03/13/2021 23:32    Microbiology: Results for orders placed or performed during the hospital encounter of 03/13/21  Resp Panel by RT-PCR (Flu A&B, Covid) Nasopharyngeal Swab     Status: None   Collection Time: 03/13/21 11:11 PM   Specimen: Nasopharyngeal Swab; Nasopharyngeal(NP) swabs in vial transport medium  Result Value Ref Range Status   SARS Coronavirus 2 by RT PCR NEGATIVE NEGATIVE Final    Comment: (NOTE) SARS-CoV-2 target nucleic acids are NOT DETECTED.  The SARS-CoV-2 RNA is generally detectable in upper respiratory specimens during the acute phase of infection. The lowest concentration of SARS-CoV-2 viral copies this assay can detect is 138 copies/mL. A negative result does not preclude SARS-Cov-2 infection and should not be used as the sole basis for treatment or other patient management decisions. A negative result  may occur with  improper specimen collection/handling, submission of specimen other than nasopharyngeal swab, presence of viral mutation(s) within the areas targeted by this assay, and inadequate number of viral copies(<138 copies/mL). A negative result must be combined with clinical observations, patient history, and epidemiological information. The expected result is Negative.  Fact Sheet for Patients:  EntrepreneurPulse.com.au  Fact Sheet for Healthcare Providers:  IncredibleEmployment.be  This test is no t yet approved or cleared by the [place] FDA and  has been authorized for detection and/or diagnosis of SARS-CoV-2 by FDA under an Emergency Use Authorization (EUA). This EUA will remain  in effect (meaning this test can be used) for the duration of the COVID-19 declaration under Section 564(b)(1) of the Act, 21 U.S.C.section 360bbb-3(b)(1), unless the authorization is terminated  or revoked sooner.       Influenza A by PCR NEGATIVE NEGATIVE Final   Influenza B by PCR NEGATIVE NEGATIVE Final    Comment: (NOTE) The Xpert Xpress SARS-CoV-2/FLU/RSV plus assay is intended as an aid in the diagnosis of influenza from Nasopharyngeal  swab specimens and should not be used as a sole basis for treatment. Nasal washings and aspirates are unacceptable for Xpert Xpress SARS-CoV-2/FLU/RSV testing.  Fact Sheet for Patients: EntrepreneurPulse.com.au  Fact Sheet for Healthcare Providers: IncredibleEmployment.be  This test is not yet approved or cleared by the [place] FDA and has been authorized for detection and/or diagnosis of SARS-CoV-2 by FDA under an Emergency Use Authorization (EUA). This EUA will remain in effect (meaning this test can be used) for the duration of the COVID-19 declaration under Section 564(b)(1) of the Act, 21 U.S.C. section 360bbb-3(b)(1), unless the authorization is terminated  or revoked.  Performed at [facility], [place], [place] [address]   Respiratory (~20 pathogens) panel by PCR     Status: None   Collection Time: 03/15/21  5:05 PM   Specimen: Nasopharyngeal Swab; Respiratory  Result Value Ref Range Status   Adenovirus NOT DETECTED NOT DETECTED Final   Coronavirus 229E NOT DETECTED NOT DETECTED Final    Comment: (NOTE) The Coronavirus on the Respiratory Panel, DOES NOT test for the novel  Coronavirus (2019 nCoV)    Coronavirus HKU1 NOT DETECTED NOT DETECTED Final   Coronavirus NL63 NOT DETECTED NOT DETECTED Final   Coronavirus OC43 NOT DETECTED NOT DETECTED Final   Metapneumovirus NOT DETECTED NOT DETECTED Final   Rhinovirus / Enterovirus NOT DETECTED NOT DETECTED Final   Influenza A NOT DETECTED NOT DETECTED Final   Influenza B NOT DETECTED NOT DETECTED Final   Parainfluenza Virus 1 NOT DETECTED NOT DETECTED Final   Parainfluenza Virus 2 NOT DETECTED NOT DETECTED Final   Parainfluenza Virus 3 NOT DETECTED NOT DETECTED Final   Parainfluenza Virus 4 NOT DETECTED NOT DETECTED Final   Respiratory Syncytial Virus NOT DETECTED NOT DETECTED Final   Bordetella pertussis NOT DETECTED NOT DETECTED Final   Bordetella Parapertussis NOT DETECTED NOT DETECTED Final   Chlamydophila pneumoniae NOT DETECTED NOT DETECTED Final   Mycoplasma pneumoniae NOT DETECTED NOT DETECTED Final    Comment: Performed at [facility], [place]. [address] [place], [place] [address]    Labs: CBC: Recent Labs  Lab 04/02/21 1756 04/04/21 0323  WBC 9.0 15.4*  NEUTROABS  --  13.4*  HGB 11.0* 11.6*  HCT 34.1* 36.1  MCV 90.0 90.7  PLT 273 314   Basic Metabolic Panel: Recent Labs  Lab 04/02/21 1756 04/04/21 0323 04/05/21 0402  NA 135 134* 133*  K 2.8* 4.3 4.9  CL 93* 94* 94*  CO2 32 30 29  GLUCOSE 108* 287* 343*  BUN 10 20 24*  CREATININE 1.00 1.02* 0.95  CALCIUM 8.6* 9.3 9.4  MG  --   --  2.0   Liver Function Tests: No results for  input(s): AST, ALT, ALKPHOS, BILITOT, PROT, ALBUMIN in the last 168 hours. CBG: Recent Labs  Lab 04/04/21 1525 04/04/21 1744 04/04/21 2014 04/05/21 0548 04/05/21 0759  GLUCAP 250* 351* 263* 402* 267*    Discharge time spent: greater than 30 minutes.  Signed: Aline August, MD [facility] 04/05/2021

## 2021-04-05 NOTE — TOC Progression Note (Addendum)
Transition of Care [facility]) - Progression Note    Patient Details  Name: Joan [name] MRN: [id number] Date of Birth: September 21, 1960  Transition of Care [facility]) CM/SW Wallace, RN Phone Number: 04/05/2021, 10:12 AM  Clinical Narrative:     Reached out to the patient and verified the address, she is requesting that EMS transport home, I explained Ins may not cover and she could get the [facility], she stated understanding  Called EMS to transport, Nurse and charge nurse aware  Expected Discharge Plan: [place] Barriers to Discharge: Continued Medical Work up  Expected Discharge Plan and Services Expected Discharge Plan: [place]   Discharge Planning Services: CM Consult     Expected Discharge Date: 04/05/21                                     Social Determinants of Health (SDOH) Interventions    Readmission Risk Interventions Readmission Risk Prevention Plan 04/07/2019  Transportation Screening Complete  PCP or Specialist Appt within 3-5 Days Complete  HRI or [place] Complete  Social Work Consult for [place] Planning/Counseling Complete  Palliative Care Screening Not Applicable  Medication Review Press photographer) Complete  Some recent data might be hidden

## 2021-04-05 NOTE — Progress Notes (Signed)
Discharge instructions given and reviewed with patient. Steroid pack supplied from hospital. EMS to transport.

## 2021-04-06 DIAGNOSIS — J9611 Chronic respiratory failure with hypoxia: Secondary | ICD-10-CM | POA: Diagnosis not present

## 2021-04-06 DIAGNOSIS — J449 Chronic obstructive pulmonary disease, unspecified: Secondary | ICD-10-CM | POA: Diagnosis not present

## 2021-04-09 ENCOUNTER — Telehealth: Payer: Self-pay

## 2021-04-09 NOTE — Telephone Encounter (Signed)
Transition Care Management Follow-up Telephone Call Date of discharge and from where:TCM DC [facility] 04-05-21 Dx: COPD exac  How have you been since you were released from the hospital? Feeling better  Any questions or concerns? No  Items Reviewed: Did the pt receive and understand the discharge instructions provided? Yes  Medications obtained and verified? Yes  Other? No  Any new allergies since your discharge? No  Dietary orders reviewed? Yes Do you have support at home? Yes   Home Care and Equipment/Supplies: Were home health services ordered? no If so, what is the name of the agency? na  Has the agency set up a time to come to the patient's home? not applicable Were any new equipment or medical supplies ordered?  Yes- shower chair  What is the name of the medical supply agency? na Were you able to get the supplies/equipment? no Do you have any questions related to the use of the equipment or supplies? No  Functional Questionnaire: (I = Independent and D = Dependent) ADLs: I  Bathing/Dressing- I  Meal Prep- I  Eating- I  Maintaining continence- I  Transferring/Ambulation- I  Managing Meds- I  Follow up appointments reviewed:  PCP Hospital f/u appt confirmed? Yes  Scheduled to see Dr Sharlet Salina  on 04-11-21 @ 1040am. [place] [facility] f/u appt confirmed? No . Are transportation arrangements needed? No  If their condition worsens, is the pt aware to call PCP or go to the Emergency Dept.? Yes Was the patient provided with contact information for the PCP's office or ED? Yes Was to pt encouraged to call back with questions or concerns? Yes

## 2021-04-10 ENCOUNTER — Other Ambulatory Visit: Payer: Medicare Other

## 2021-04-10 ENCOUNTER — Telehealth: Payer: Self-pay | Admitting: Internal Medicine

## 2021-04-10 ENCOUNTER — Other Ambulatory Visit (HOSPITAL_COMMUNITY): Payer: Self-pay

## 2021-04-10 DIAGNOSIS — J9611 Chronic respiratory failure with hypoxia: Secondary | ICD-10-CM | POA: Diagnosis not present

## 2021-04-10 DIAGNOSIS — I10 Essential (primary) hypertension: Secondary | ICD-10-CM | POA: Diagnosis not present

## 2021-04-10 DIAGNOSIS — Z9989 Dependence on other enabling machines and devices: Secondary | ICD-10-CM | POA: Diagnosis not present

## 2021-04-10 DIAGNOSIS — J449 Chronic obstructive pulmonary disease, unspecified: Secondary | ICD-10-CM | POA: Diagnosis not present

## 2021-04-10 DIAGNOSIS — E1140 Type 2 diabetes mellitus with diabetic neuropathy, unspecified: Secondary | ICD-10-CM | POA: Diagnosis not present

## 2021-04-10 DIAGNOSIS — F17200 Nicotine dependence, unspecified, uncomplicated: Secondary | ICD-10-CM | POA: Diagnosis not present

## 2021-04-10 DIAGNOSIS — J45909 Unspecified asthma, uncomplicated: Secondary | ICD-10-CM | POA: Diagnosis not present

## 2021-04-10 DIAGNOSIS — J441 Chronic obstructive pulmonary disease with (acute) exacerbation: Secondary | ICD-10-CM | POA: Diagnosis not present

## 2021-04-10 DIAGNOSIS — G4733 Obstructive sleep apnea (adult) (pediatric): Secondary | ICD-10-CM | POA: Diagnosis not present

## 2021-04-10 DIAGNOSIS — F32A Depression, unspecified: Secondary | ICD-10-CM | POA: Diagnosis not present

## 2021-04-10 NOTE — Telephone Encounter (Signed)
Pt informing provider a fax was sent from [employer] on 2-14 regarding replacing wheel chair battery

## 2021-04-10 NOTE — Telephone Encounter (Signed)
Noted. I will keep a eye out for it.

## 2021-04-11 ENCOUNTER — Ambulatory Visit: Payer: Medicare Other

## 2021-04-11 ENCOUNTER — Other Ambulatory Visit: Payer: Self-pay

## 2021-04-11 ENCOUNTER — Other Ambulatory Visit: Payer: Medicare Other

## 2021-04-11 ENCOUNTER — Ambulatory Visit (INDEPENDENT_AMBULATORY_CARE_PROVIDER_SITE_OTHER): Payer: Medicare Other | Admitting: Internal Medicine

## 2021-04-11 ENCOUNTER — Encounter: Payer: Self-pay | Admitting: Internal Medicine

## 2021-04-11 VITALS — BP 130/70 | HR 54 | Ht 68.0 in

## 2021-04-11 DIAGNOSIS — J441 Chronic obstructive pulmonary disease with (acute) exacerbation: Secondary | ICD-10-CM | POA: Diagnosis not present

## 2021-04-11 DIAGNOSIS — E1169 Type 2 diabetes mellitus with other specified complication: Secondary | ICD-10-CM

## 2021-04-11 DIAGNOSIS — J410 Simple chronic bronchitis: Secondary | ICD-10-CM | POA: Diagnosis not present

## 2021-04-11 DIAGNOSIS — F419 Anxiety disorder, unspecified: Secondary | ICD-10-CM

## 2021-04-11 DIAGNOSIS — J9621 Acute and chronic respiratory failure with hypoxia: Secondary | ICD-10-CM

## 2021-04-11 MED ORDER — BLOOD GLUCOSE METER KIT: PACK | 0 refills | Status: DC

## 2021-04-11 NOTE — Progress Notes (Signed)
° °  Subjective:   Patient ID: Joan [name], female    DOB: 01-May-1960, 61 y.o.   MRN: [id number]  HPI The patient is a 61 YO female coming in for hospital follow up (in from 04/03/21-04/05/21 for acute COPD exacerbation, given IV steroids and nebulizer treatment, needed insulin for blood sugar control). She is still struggling to breathe some. She did not take the steroids due to her concerns about high blood sugar. Seeing cardiology next week she had some chest tightness since being home lasted a few minutes. Using oxygen all the time. No worsening SOB and feels like it is gradually improving.   PMH, [facility], social history reviewed and updated  Review of Systems  Constitutional:  Positive for activity change.  HENT: Negative.    Eyes: Negative.   Respiratory:  Positive for shortness of breath. Negative for cough and chest tightness.   Cardiovascular:  Negative for chest pain, palpitations and leg swelling.  Gastrointestinal:  Negative for abdominal distention, abdominal pain, constipation, diarrhea, nausea and vomiting.  Musculoskeletal: Negative.   Skin: Negative.   Neurological: Negative.   Psychiatric/Behavioral: Negative.     Objective:  Physical Exam Constitutional:      Appearance: She is well-developed.  HENT:     Head: Normocephalic and atraumatic.  Cardiovascular:     Rate and Rhythm: Normal rate and regular rhythm.  Pulmonary:     Effort: Pulmonary effort is normal. No respiratory distress.     Breath sounds: No wheezing or rales.     Comments: Lung exam slightly improved from prior, chronic changes, 3L oxygen in place Abdominal:     General: Bowel sounds are normal. There is no distension.     Palpations: Abdomen is soft.     Tenderness: There is no abdominal tenderness. There is no rebound.  Musculoskeletal:     Cervical back: Normal range of motion.  Skin:    General: Skin is warm and dry.  Neurological:     Mental Status: She is alert and oriented to person,  place, and time.     Coordination: Coordination abnormal.    Vitals:   04/11/21 1038  BP: 130/70  Pulse: (!) 54  SpO2: 100%  Height: 5\' 8"  (1.727 m)    This visit occurred during the SARS-CoV-2 public health emergency.  Safety protocols were in place, including screening questions prior to the visit, additional usage of staff PPE, and extensive cleaning of exam room while observing appropriate contact time as indicated for disinfecting solutions.   Assessment & Plan:

## 2021-04-11 NOTE — Patient Instructions (Addendum)
We will send in the glucose meter for you to have to check the sugar if needed. For the morning sugar it should be 100-160. If this is consistently over 200 let us know.  For a sugar level less than 70 that is low.   We will look at what the cardiologist says when you see him.

## 2021-04-11 NOTE — Assessment & Plan Note (Signed)
Overall stable, advised to quit.

## 2021-04-11 NOTE — Assessment & Plan Note (Signed)
Seems to be resolving. Counseled to stop smoking. Using breztri and nebulizer. Uses oxygen 3L chronically.

## 2021-04-11 NOTE — Assessment & Plan Note (Signed)
Reviewed her recent HgA1c and that she does not need long term medication at this time. She does need ability to monitor her blood sugars so meter prescribed today.

## 2021-04-11 NOTE — Assessment & Plan Note (Signed)
Stabilizing, she did not take oral steroid taper once she was discharged about a week ago. She is doing okay so advised to stay off steroids. If worsening SOB or worsening cough start taking these and let us know.

## 2021-04-11 NOTE — Assessment & Plan Note (Signed)
Working with counseling which has helped some and new living situation has removed a lot of stress from her.

## 2021-04-12 ENCOUNTER — Telehealth (HOSPITAL_COMMUNITY): Payer: Self-pay

## 2021-04-12 DIAGNOSIS — J441 Chronic obstructive pulmonary disease with (acute) exacerbation: Secondary | ICD-10-CM | POA: Diagnosis not present

## 2021-04-12 DIAGNOSIS — Z9989 Dependence on other enabling machines and devices: Secondary | ICD-10-CM | POA: Diagnosis not present

## 2021-04-12 DIAGNOSIS — F32A Depression, unspecified: Secondary | ICD-10-CM | POA: Diagnosis not present

## 2021-04-12 DIAGNOSIS — G4733 Obstructive sleep apnea (adult) (pediatric): Secondary | ICD-10-CM | POA: Diagnosis not present

## 2021-04-12 DIAGNOSIS — J45909 Unspecified asthma, uncomplicated: Secondary | ICD-10-CM | POA: Diagnosis not present

## 2021-04-12 DIAGNOSIS — F17200 Nicotine dependence, unspecified, uncomplicated: Secondary | ICD-10-CM | POA: Diagnosis not present

## 2021-04-12 DIAGNOSIS — E1140 Type 2 diabetes mellitus with diabetic neuropathy, unspecified: Secondary | ICD-10-CM | POA: Diagnosis not present

## 2021-04-12 DIAGNOSIS — I10 Essential (primary) hypertension: Secondary | ICD-10-CM | POA: Diagnosis not present

## 2021-04-12 NOTE — Telephone Encounter (Signed)
Nothing needed at this time.  

## 2021-04-12 NOTE — Telephone Encounter (Signed)
Received form via fax queue  Advised patient will place in provider box.. please let patient know when forms have been completed & faxed back to [place] # [phone]

## 2021-04-12 NOTE — Telephone Encounter (Signed)
Noted  

## 2021-04-12 NOTE — Telephone Encounter (Signed)
Patient calling in to see if we received fax from [place] regarding a rx for a wheelchair battery & charger.. nurse stated we have not received fax  Patient wants provider to send rx for new wheelchair battery & charger to [place]: Joan [name] [phone] (P)  Patient did not have fax #

## 2021-04-13 ENCOUNTER — Encounter: Payer: Self-pay | Admitting: Internal Medicine

## 2021-04-13 ENCOUNTER — Ambulatory Visit
Admission: RE | Admit: 2021-04-13 | Discharge: 2021-04-13 | Disposition: A | Discharge status: Home / Self Care | Payer: Medicare Other | Source: Ambulatory Visit | Attending: Internal Medicine | Admitting: Internal Medicine

## 2021-04-13 DIAGNOSIS — R109 Unspecified abdominal pain: Secondary | ICD-10-CM | POA: Diagnosis not present

## 2021-04-13 DIAGNOSIS — R1084 Generalized abdominal pain: Secondary | ICD-10-CM

## 2021-04-13 NOTE — Telephone Encounter (Signed)
Called and spoke with pt checking to see if she had heard from [facility] about her inhaler or the paperwork and she said that she had not heard anything from them. I stated to her that we had tried to call them but each time we call, we are on hold for longer than 15 minutes. Recommended her to call [employer] to check status of application. Pt got very unhappy after I stated that to her. Pt asked me angrily why she was just now hearing about all of this and I tried to explain to pt that we are short staffed and are trying to get everything handled in a timely manner but she still didn't want to hear any part of it. I apologized to pt and advised her again to try to reach out to [facility] to see if they could provide her an update.

## 2021-04-17 ENCOUNTER — Other Ambulatory Visit: Payer: Self-pay | Admitting: *Deleted

## 2021-04-17 ENCOUNTER — Other Ambulatory Visit: Payer: Medicare Other

## 2021-04-17 ENCOUNTER — Ambulatory Visit: Payer: Medicare Other | Admitting: Cardiovascular Disease

## 2021-04-17 ENCOUNTER — Other Ambulatory Visit: Payer: Self-pay

## 2021-04-17 ENCOUNTER — Encounter: Payer: Self-pay | Admitting: Cardiovascular Disease

## 2021-04-17 DIAGNOSIS — I10 Essential (primary) hypertension: Secondary | ICD-10-CM | POA: Diagnosis not present

## 2021-04-17 DIAGNOSIS — J439 Emphysema, unspecified: Secondary | ICD-10-CM | POA: Diagnosis not present

## 2021-04-17 DIAGNOSIS — R0789 Other chest pain: Secondary | ICD-10-CM | POA: Diagnosis not present

## 2021-04-17 DIAGNOSIS — E1169 Type 2 diabetes mellitus with other specified complication: Secondary | ICD-10-CM

## 2021-04-17 DIAGNOSIS — E785 Hyperlipidemia, unspecified: Secondary | ICD-10-CM

## 2021-04-17 DIAGNOSIS — F17210 Nicotine dependence, cigarettes, uncomplicated: Secondary | ICD-10-CM

## 2021-04-17 DIAGNOSIS — Z87891 Personal history of nicotine dependence: Secondary | ICD-10-CM

## 2021-04-17 NOTE — Patient Instructions (Signed)
Medication Instructions:  ?Your physician recommends that you continue on your current medications as directed. Please refer to the Current Medication list given to you today. ? ?*If you need a refill on your cardiac medications before your next appointment, please call your pharmacy* ? ? ?Follow-Up: ?At [facility], you and your health needs are our priority.  As part of our continuing mission to provide you with exceptional heart care, we have created designated Provider Care Teams.  These Care Teams include your primary Cardiologist (physician) and Advanced Practice Providers (APPs -  Physician Assistants and Nurse Practitioners) who all work together to provide you with the care you need, when you need it. ? ?We recommend signing up for the patient portal called "MyChart".  Sign up information is provided on this After Visit Summary.  MyChart is used to connect with patients for Virtual Visits (Telemedicine).  Patients are able to view lab/test results, encounter notes, upcoming appointments, etc.  Non-urgent messages can be sent to your provider as well.   ?To learn more about what you can do with MyChart, go to [email or web address].   ? ?Your next appointment:   ?6 month(s) ? ?The format for your next appointment:   ?In Person ? ?Provider:   ?Jesse Cleaver, FNP, Angela Duke, PA-C, Callie Goodrich, PA-C, Jennifer, Lambert, PA-C, Kathryn [name], DNP, ANP, or Hao Meng, PA-C     ? ? ?Then, Jonathan Berry, MD will plan to see you again in 12 month(s).  ?

## 2021-04-17 NOTE — Progress Notes (Signed)
04/17/2021 Joan [name]   1960-12-16  [id number]  Primary Physician Joan Koch, MD Primary Cardiologist: Joan Harp MD [name], [place]  HPI:  Joan [name] is a 61 y.o.  moderately overweight widowed Caucasian female mother of 70, grandmother of 5 grandchildren referred by Dr. Sharlet [name] for cardiovascular evaluation because of chest pressure.  He relocated from the [name] to [place] to be closer to family.  She was with her brother [name] , but currently lives alone..  She is been disabled since 2000 because of bipolar disorder.  I last saw her in the office 11/05/2017. Her cardiac risk factors include close to 100 pack years of tobacco abuse currently smoking 1 pack/week as well as treated hypertension and hyperlipidemia.  She has a strong family history of heart disease with mother who had stents as well as all 5 sisters.  She is never had a heart attack or stroke.  She is chronically short of breath most likely because of COPD with ongoing tobacco abuse and has complained of chest pressure over the last several months.  She was also told that she had a 50% right carotid artery stenosis by remote ultrasound.   Since I saw her over 2 years ago she has been hospitalized several times for COPD exacerbation, most recently earlier this month.  She did start back smoking after she saw me over 2 years ago but says she recently quit.  She did have a 2D echocardiogram performed 08/11/2020 which was essentially normal.  She had a chest CT performed 03/08/2020 that showed coronary calcification and a negative Myoview 08/19/2017.  She does complain of some atypical chest pain.  She now is oxygen dependent 24/7 and for the most part is wheelchair-bound unable to ambulate because of her severe pulmonary issues.  Current Meds  Medication Sig   acyclovir (ZOVIRAX) 400 MG tablet TAKE 1 TABLET BY MOUTH  TWICE DAILY (Patient taking differently: 400 mg daily.)   albuterol  (PROVENTIL) (2.5 MG/3ML) 0.083% nebulizer solution Take 3 mLs (2.5 mg total) by nebulization 2 (two) times daily. And every 6 hours as needed.  DX: J44.9   atenolol (TENORMIN) 25 MG tablet Take 25 mg by mouth daily.   atorvastatin (LIPITOR) 20 MG tablet TAKE 1 TABLET BY MOUTH  DAILY   blood glucose meter kit and supplies Dispense based on patient and insurance preference. Use up to two times daily as directed. (FOR ICD-10 E10.9, E11.9).   Budeson-Glycopyrrol-Formoterol (BREZTRI AEROSPHERE) 160-9-4.8 MCG/ACT AERO Inhale 2 puffs into the lungs in the morning and at bedtime.   buPROPion (WELLBUTRIN XL) 150 MG 24 hr tablet TAKE 1 TABLET BY MOUTH  DAILY   busPIRone (BUSPAR) 5 MG tablet Take 1 tablet (5 mg total) by mouth at bedtime.   Camphor-Menthol-Methyl Sal (SALONPAS) 3.03-02-08 % PTCH Apply 1 patch topically daily as needed.   cyclobenzaprine (FLEXERIL) 10 MG tablet Take 10 mg by mouth at bedtime.   hydrochlorothiazide (HYDRODIURIL) 25 MG tablet TAKE 1 TABLET BY MOUTH  DAILY   Ipratropium-Albuterol (COMBIVENT RESPIMAT) 20-100 MCG/ACT AERS respimat Inhale 1 puff into the lungs every 6 (six) hours as needed for wheezing or shortness of breath.   levothyroxine (SYNTHROID) 25 MCG tablet TAKE 1 TABLET BY MOUTH  DAILY BEFORE BREAKFAST   Liniments (SALONPAS ARTHRITIS PAIN RELIEF EX) Apply 1 patch topically daily as needed (to painful sites- remove as directed).    methylPREDNISolone (MEDROL DOSEPAK) 4 MG TBPK tablet As per  medrol dose pack instructions   montelukast (SINGULAIR) 10 MG tablet TAKE 1 TABLET BY MOUTH AT  BEDTIME   nystatin (MYCOSTATIN) 100000 UNIT/ML suspension Take 5 mLs (500,000 Units total) by mouth 4 (four) times daily.   nystatin-triamcinolone ointment (MYCOLOG) Apply 1 application topically 2 (two) times daily as needed (applied to skin folds/groins/under breast skin irritation rash.).   OXYGEN Inhale 3 L/min into the lungs See admin instructions. Inhale  3 L/min of oxygen into the lungs  w/CPAP at bedtime and as needed for shortness of breath with "strenuous activity" during the day   pantoprazole (PROTONIX) 40 MG tablet TAKE 1 TABLET BY MOUTH  DAILY   PATADAY 0.2 % SOLN Apply 1 drop to eye daily.   polyethylene glycol (MIRALAX / GLYCOLAX) 17 g packet Take 17 g by mouth daily as needed for mild constipation.   pregabalin (LYRICA) 150 MG capsule Take 1 capsule (150 mg total) by mouth at bedtime.   QUEtiapine (SEROQUEL) 50 MG tablet TAKE 1 TABLET BY MOUTH AT  BEDTIME   roflumilast (DALIRESP) 500 MCG TABS tablet Take 1 tablet (500 mcg total) by mouth daily.   traZODone (DESYREL) 100 MG tablet TAKE 1 TABLET BY MOUTH AT  BEDTIME   varenicline (CHANTIX) 1 MG tablet Take 1 tablet (1 mg total) by mouth 2 (two) times daily. For smoking cessation.   venlafaxine XR (EFFEXOR-XR) 150 MG 24 hr capsule TAKE 1 CAPSULE BY MOUTH  DAILY WITH BREAKFAST   witch hazel-glycerin (TUCKS) pad Apply topically as needed for itching.     Allergies  Allergen Reactions   Keflex [Cephalexin] Rash and Other (See Comments)    Has tolerated amoxicillin since had keflex   Shellfish Allergy Shortness Of Breath, Nausea And Vomiting and Other (See Comments)    Stomach cramps   Tetracyclines & Related Anaphylaxis, Swelling and Other (See Comments)    Throat swelling requiring hospitalization   Dilaudid [Hydromorphone Hcl] Other (See Comments)    "Lethargy"   Prednisone Other (See Comments)    "counter reacts", has tolerated IV medrol   Incruse Ellipta [Umeclidinium Bromide] Nausea Only   Other    Tuberculin Tests Other (See Comments)    False postive    Social History   Socioeconomic History   Marital status: Widowed    Spouse name: Not on file   Number of children: 3   Years of education: Not on file   Highest education level: Not on file  Occupational History   Not on file  Tobacco Use   Smoking status: Every Day    Packs/day: 0.50    Years: 44.00    Pack years: 22.00    Types: Cigarettes    Smokeless tobacco: Never   Tobacco comments:    down to 0.5ppd 06/30/20/jm  Vaping Use   Vaping Use: Never used  Substance and Sexual Activity   Alcohol use: Not Currently   Drug use: Not Currently    Types: "Crack" cocaine    Comment:  "nothing since 01/12/2012"   Sexual activity: Not Currently    Partners: Male  Other Topics Concern   Not on file  Social History Narrative   Not on file   Social Determinants of Health   Financial Resource Strain: High Risk   Difficulty of Paying Living Expenses: Hard  Food Insecurity: No Food Insecurity   Worried About Running Out of Food in the Last Year: Never true   Ran Out of Food in the Last Year: Never true  Transportation  Needs: No Transportation Needs   Lack of Transportation (Medical): No   Lack of Transportation (Non-Medical): No  Physical Activity: Not on file  Stress: Stress Concern Present   Feeling of Stress : Very much  Social Connections: Not on file  Intimate Partner Violence: Not on file     Review of Systems: General: negative for chills, fever, night sweats or weight changes.  Cardiovascular: negative for chest pain, dyspnea on exertion, edema, orthopnea, palpitations, paroxysmal nocturnal dyspnea or shortness of breath Dermatological: negative for rash Respiratory: negative for cough or wheezing Urologic: negative for hematuria Abdominal: negative for nausea, vomiting, diarrhea, bright red blood per rectum, melena, or hematemesis Neurologic: negative for visual changes, syncope, or dizziness All other systems reviewed and are otherwise negative except as noted above.    Blood pressure 108/60, pulse 69, height 5' 8" (1.727 m), weight 200 lb (90.7 kg).  General appearance: alert and no distress Neck: no adenopathy, no carotid bruit, no JVD, supple, symmetrical, trachea midline, and thyroid not enlarged, symmetric, no tenderness/mass/nodules Lungs: clear to auscultation bilaterally Heart: regular rate and rhythm, S1,  S2 normal, no murmur, click, rub or gallop Extremities: extremities normal, atraumatic, no cyanosis or edema Pulses: 2+ and symmetric Skin: Skin color, texture, turgor normal. No rashes or lesions Neurologic: Grossly normal  EKG sinus rhythm at 69 with nonspecific ST and T wave changes.  I personally reviewed this EKG.  ASSESSMENT AND PLAN:   Essential hypertension History of essential hypertension blood pressure measured today at 108/60.  She is on atenolol, and hydrochlorothiazide.  Hyperlipidemia associated with type 2 diabetes mellitus ([place]) History of hyperlipidemia on statin therapy with lipid profile performed 03/07/2020 revealing total cholesterol 141, LDL 58 and HDL 54.  Atypical chest pain History of atypical chest pain with a negative Myoview performed 08/19/2017.  She did have coronary calcification on a CT scan performed 03/08/2020.  COPD (chronic obstructive pulmonary disease) with emphysema (HCC) History of COPD with a long history tobacco abuse.  She was recently hospitalized for COPD exacerbation earlier this month and said that she "stop smoking".  She has greater than 100 pack years of tobacco use now oxygen dependent 24/7 and unable to ambulate because of this.     Joan Harp MD FACP,FACC,FAHA, [facility] 04/17/2021 10:51 AM

## 2021-04-17 NOTE — Assessment & Plan Note (Signed)
History of hyperlipidemia on statin therapy with lipid profile performed 03/07/2020 revealing total cholesterol 141, LDL 58 and HDL 54.

## 2021-04-17 NOTE — Assessment & Plan Note (Signed)
History of COPD with a long history tobacco abuse.  She was recently hospitalized for COPD exacerbation earlier this month and said that she "stop smoking".  She has greater than 100 pack years of tobacco use now oxygen dependent 24/7 and unable to ambulate because of this.

## 2021-04-17 NOTE — Assessment & Plan Note (Signed)
History of atypical chest pain with a negative Myoview performed 08/19/2017.  She did have coronary calcification on a CT scan performed 03/08/2020.

## 2021-04-17 NOTE — Assessment & Plan Note (Signed)
History of essential hypertension blood pressure measured today at 108/60.  She is on atenolol, and hydrochlorothiazide.

## 2021-04-18 ENCOUNTER — Other Ambulatory Visit: Payer: Self-pay | Admitting: Internal Medicine

## 2021-04-18 ENCOUNTER — Other Ambulatory Visit (HOSPITAL_COMMUNITY): Payer: Self-pay | Admitting: Psychiatry

## 2021-04-18 DIAGNOSIS — J45909 Unspecified asthma, uncomplicated: Secondary | ICD-10-CM | POA: Diagnosis not present

## 2021-04-18 DIAGNOSIS — F32A Depression, unspecified: Secondary | ICD-10-CM | POA: Diagnosis not present

## 2021-04-18 DIAGNOSIS — F17200 Nicotine dependence, unspecified, uncomplicated: Secondary | ICD-10-CM | POA: Diagnosis not present

## 2021-04-18 DIAGNOSIS — J441 Chronic obstructive pulmonary disease with (acute) exacerbation: Secondary | ICD-10-CM | POA: Diagnosis not present

## 2021-04-18 DIAGNOSIS — Z9989 Dependence on other enabling machines and devices: Secondary | ICD-10-CM | POA: Diagnosis not present

## 2021-04-18 DIAGNOSIS — I10 Essential (primary) hypertension: Secondary | ICD-10-CM | POA: Diagnosis not present

## 2021-04-18 DIAGNOSIS — G4733 Obstructive sleep apnea (adult) (pediatric): Secondary | ICD-10-CM | POA: Diagnosis not present

## 2021-04-18 DIAGNOSIS — E1140 Type 2 diabetes mellitus with diabetic neuropathy, unspecified: Secondary | ICD-10-CM | POA: Diagnosis not present

## 2021-04-19 ENCOUNTER — Ambulatory Visit (HOSPITAL_COMMUNITY): Payer: Medicare Other | Admitting: Licensed Clinical Social Worker

## 2021-04-19 ENCOUNTER — Telehealth: Payer: Medicare Other

## 2021-04-19 ENCOUNTER — Encounter: Payer: Self-pay | Admitting: *Deleted

## 2021-04-19 ENCOUNTER — Telehealth: Payer: Self-pay | Admitting: *Deleted

## 2021-04-19 NOTE — Progress Notes (Signed)
Therapist contacted patient by email and she did not respond. Session is a no show.

## 2021-04-19 NOTE — Telephone Encounter (Signed)
°  Chronic Care Management   Follow Up Note   04/19/2021 Name: Joan [name] MRN: [id number] DOB: [birth year]  Referred by: [name], MD Reason for referral : Chronic Care Management (CCM RN CM Follow Up Telephone Visit; COPD; DMII)  An unsuccessful telephone outreach was attempted today. The patient was referred to the case management team for assistance with care management and care coordination.   Follow Up Plan:  A HIPPA compliant phone message was left for the patient providing contact information and requesting a return call Will place request with scheduling care guide to contact patient to re-schedule today's missed CCM RN follow up telephone appointment if I do not hear back from patient by end of day  Oneta Rack, RN, BSN, [place] [phone]: direct office

## 2021-04-20 ENCOUNTER — Encounter: Payer: Self-pay | Admitting: Internal Medicine

## 2021-04-20 DIAGNOSIS — R1084 Generalized abdominal pain: Secondary | ICD-10-CM

## 2021-04-20 DIAGNOSIS — F32A Depression, unspecified: Secondary | ICD-10-CM | POA: Diagnosis not present

## 2021-04-20 DIAGNOSIS — Z9989 Dependence on other enabling machines and devices: Secondary | ICD-10-CM | POA: Diagnosis not present

## 2021-04-20 DIAGNOSIS — J45909 Unspecified asthma, uncomplicated: Secondary | ICD-10-CM | POA: Diagnosis not present

## 2021-04-20 DIAGNOSIS — F17200 Nicotine dependence, unspecified, uncomplicated: Secondary | ICD-10-CM | POA: Diagnosis not present

## 2021-04-20 DIAGNOSIS — E1140 Type 2 diabetes mellitus with diabetic neuropathy, unspecified: Secondary | ICD-10-CM | POA: Diagnosis not present

## 2021-04-20 DIAGNOSIS — G4733 Obstructive sleep apnea (adult) (pediatric): Secondary | ICD-10-CM | POA: Diagnosis not present

## 2021-04-20 DIAGNOSIS — I10 Essential (primary) hypertension: Secondary | ICD-10-CM | POA: Diagnosis not present

## 2021-04-20 DIAGNOSIS — I700 Atherosclerosis of aorta: Secondary | ICD-10-CM

## 2021-04-20 DIAGNOSIS — J441 Chronic obstructive pulmonary disease with (acute) exacerbation: Secondary | ICD-10-CM | POA: Diagnosis not present

## 2021-04-21 ENCOUNTER — Encounter: Payer: Self-pay | Admitting: Internal Medicine

## 2021-04-23 NOTE — Telephone Encounter (Signed)
Pt would like to know what can be done regarding her recent CT showing the plaque buildup in the arteries in her stomach. Pt would also like to note her bladder collapsed and would like to know what you advise to help with this.

## 2021-04-25 ENCOUNTER — Telehealth: Payer: Self-pay

## 2021-04-25 DIAGNOSIS — G4733 Obstructive sleep apnea (adult) (pediatric): Secondary | ICD-10-CM | POA: Diagnosis not present

## 2021-04-25 DIAGNOSIS — Z9989 Dependence on other enabling machines and devices: Secondary | ICD-10-CM | POA: Diagnosis not present

## 2021-04-25 DIAGNOSIS — J45909 Unspecified asthma, uncomplicated: Secondary | ICD-10-CM | POA: Diagnosis not present

## 2021-04-25 DIAGNOSIS — F17200 Nicotine dependence, unspecified, uncomplicated: Secondary | ICD-10-CM | POA: Diagnosis not present

## 2021-04-25 DIAGNOSIS — J441 Chronic obstructive pulmonary disease with (acute) exacerbation: Secondary | ICD-10-CM | POA: Diagnosis not present

## 2021-04-25 DIAGNOSIS — E1140 Type 2 diabetes mellitus with diabetic neuropathy, unspecified: Secondary | ICD-10-CM | POA: Diagnosis not present

## 2021-04-25 DIAGNOSIS — F32A Depression, unspecified: Secondary | ICD-10-CM | POA: Diagnosis not present

## 2021-04-25 DIAGNOSIS — I10 Essential (primary) hypertension: Secondary | ICD-10-CM | POA: Diagnosis not present

## 2021-04-25 NOTE — Telephone Encounter (Signed)
Joan [name] is calling in to obtain verbal orders for the pt to receive: ?Medical Social work ?And HH PT ? ?Please advise: [phone] ?

## 2021-04-26 NOTE — Telephone Encounter (Signed)
Okay for verbals °

## 2021-04-27 NOTE — Telephone Encounter (Signed)
LVM with verbal orders.  

## 2021-04-30 DIAGNOSIS — F32A Depression, unspecified: Secondary | ICD-10-CM | POA: Diagnosis not present

## 2021-04-30 DIAGNOSIS — I10 Essential (primary) hypertension: Secondary | ICD-10-CM | POA: Diagnosis not present

## 2021-04-30 DIAGNOSIS — G4733 Obstructive sleep apnea (adult) (pediatric): Secondary | ICD-10-CM | POA: Diagnosis not present

## 2021-04-30 DIAGNOSIS — J441 Chronic obstructive pulmonary disease with (acute) exacerbation: Secondary | ICD-10-CM | POA: Diagnosis not present

## 2021-04-30 DIAGNOSIS — E1140 Type 2 diabetes mellitus with diabetic neuropathy, unspecified: Secondary | ICD-10-CM | POA: Diagnosis not present

## 2021-04-30 DIAGNOSIS — F17200 Nicotine dependence, unspecified, uncomplicated: Secondary | ICD-10-CM | POA: Diagnosis not present

## 2021-04-30 DIAGNOSIS — J45909 Unspecified asthma, uncomplicated: Secondary | ICD-10-CM | POA: Diagnosis not present

## 2021-04-30 DIAGNOSIS — Z9989 Dependence on other enabling machines and devices: Secondary | ICD-10-CM | POA: Diagnosis not present

## 2021-04-30 NOTE — Telephone Encounter (Signed)
RS 05/04/21 ? ?[name]  ?Care Guide, Embedded Care Coordination ?[place]  Care Management  ?Direct Dial: [phone] ? ?

## 2021-04-30 NOTE — Telephone Encounter (Signed)
Wanting Verbal orders for PT. ?2 times a week for 2 weeks  ?1 time a week for 1 week ? ?[phone] ?Secured VM ?

## 2021-05-01 ENCOUNTER — Other Ambulatory Visit: Payer: Self-pay

## 2021-05-01 ENCOUNTER — Ambulatory Visit
Admission: RE | Admit: 2021-05-01 | Discharge: 2021-05-01 | Disposition: A | Discharge status: Home / Self Care | Payer: Medicare Other | Source: Ambulatory Visit | Attending: Acute Care | Admitting: Acute Care

## 2021-05-01 DIAGNOSIS — Z87891 Personal history of nicotine dependence: Secondary | ICD-10-CM | POA: Diagnosis not present

## 2021-05-01 DIAGNOSIS — F17210 Nicotine dependence, cigarettes, uncomplicated: Secondary | ICD-10-CM | POA: Diagnosis not present

## 2021-05-01 NOTE — Telephone Encounter (Signed)
Okay for verbals °

## 2021-05-02 ENCOUNTER — Other Ambulatory Visit: Payer: Self-pay | Admitting: Acute Care

## 2021-05-02 DIAGNOSIS — G4733 Obstructive sleep apnea (adult) (pediatric): Secondary | ICD-10-CM | POA: Diagnosis not present

## 2021-05-02 DIAGNOSIS — Z87891 Personal history of nicotine dependence: Secondary | ICD-10-CM

## 2021-05-02 DIAGNOSIS — F17200 Nicotine dependence, unspecified, uncomplicated: Secondary | ICD-10-CM | POA: Diagnosis not present

## 2021-05-02 DIAGNOSIS — J441 Chronic obstructive pulmonary disease with (acute) exacerbation: Secondary | ICD-10-CM | POA: Diagnosis not present

## 2021-05-02 DIAGNOSIS — F32A Depression, unspecified: Secondary | ICD-10-CM | POA: Diagnosis not present

## 2021-05-02 DIAGNOSIS — E1140 Type 2 diabetes mellitus with diabetic neuropathy, unspecified: Secondary | ICD-10-CM | POA: Diagnosis not present

## 2021-05-02 DIAGNOSIS — I10 Essential (primary) hypertension: Secondary | ICD-10-CM | POA: Diagnosis not present

## 2021-05-02 DIAGNOSIS — Z9989 Dependence on other enabling machines and devices: Secondary | ICD-10-CM | POA: Diagnosis not present

## 2021-05-02 DIAGNOSIS — J45909 Unspecified asthma, uncomplicated: Secondary | ICD-10-CM | POA: Diagnosis not present

## 2021-05-02 NOTE — Telephone Encounter (Signed)
Verbal orders given to Stacy 

## 2021-05-03 ENCOUNTER — Ambulatory Visit (HOSPITAL_COMMUNITY): Payer: Medicare Other | Admitting: Licensed Clinical Social Worker

## 2021-05-03 NOTE — Progress Notes (Signed)
Therapist contacted patient by email and My Chart and she did not respond session is a no show ?

## 2021-05-04 ENCOUNTER — Telehealth: Payer: Self-pay | Admitting: Pulmonary Disease

## 2021-05-04 ENCOUNTER — Ambulatory Visit (INDEPENDENT_AMBULATORY_CARE_PROVIDER_SITE_OTHER): Payer: Medicare Other | Admitting: *Deleted

## 2021-05-04 DIAGNOSIS — F32A Depression, unspecified: Secondary | ICD-10-CM | POA: Diagnosis not present

## 2021-05-04 DIAGNOSIS — J449 Chronic obstructive pulmonary disease, unspecified: Secondary | ICD-10-CM

## 2021-05-04 DIAGNOSIS — J45909 Unspecified asthma, uncomplicated: Secondary | ICD-10-CM | POA: Diagnosis not present

## 2021-05-04 DIAGNOSIS — G4733 Obstructive sleep apnea (adult) (pediatric): Secondary | ICD-10-CM | POA: Diagnosis not present

## 2021-05-04 DIAGNOSIS — J441 Chronic obstructive pulmonary disease with (acute) exacerbation: Secondary | ICD-10-CM | POA: Diagnosis not present

## 2021-05-04 DIAGNOSIS — Z9989 Dependence on other enabling machines and devices: Secondary | ICD-10-CM | POA: Diagnosis not present

## 2021-05-04 DIAGNOSIS — J9611 Chronic respiratory failure with hypoxia: Secondary | ICD-10-CM

## 2021-05-04 DIAGNOSIS — I10 Essential (primary) hypertension: Secondary | ICD-10-CM

## 2021-05-04 DIAGNOSIS — E1140 Type 2 diabetes mellitus with diabetic neuropathy, unspecified: Secondary | ICD-10-CM | POA: Diagnosis not present

## 2021-05-04 DIAGNOSIS — F17200 Nicotine dependence, unspecified, uncomplicated: Secondary | ICD-10-CM | POA: Diagnosis not present

## 2021-05-04 NOTE — Patient Instructions (Signed)
Visit Information  Ever, thank you for taking time to talk with me today. Please don't hesitate to contact me if I can be of assistance to you before our next scheduled telephone appointment.  Below are the goals we discussed today:  Patient Self-Care Activities: Patient [name] will: Take medications as prescribed Attend all scheduled provider appointments Call pharmacy for medication refills Call provider office for new concerns or questions Try to follow heart healthy, low salt, low cholesterol, carbohydrate-modified, low sugar diet as much as you are able [place] job with not smoking:  You are doing GREAT! Keep up the very good work! Try to get some light activity on days the weather is nice  Follow rescue plan if symptoms of COPD/ breathing difficulty flare-up- if you have ongoing trouble with or worsening of your breathing, call your lung doctor right away Continue using your oxygen as prescribed Continue visiting regularly with your established counselor to keep your stress and anxiety/ depression under good control Keep up the great work not having any falls!  Continue using your walker as needed  Our next scheduled telephone follow up visit/ appointment is scheduled on: Wednesday, June 13, 2021 at 3:00 pm- This is a PHONE [place] appointment  If you need to cancel or re-schedule our visit, please call [phone] and our care guide team will be happy to assist you.   I look forward to hearing about your progress.   Oneta Rack, RN, BSN, [place] [phone]: direct office  If you are experiencing a Mental Health or [place] or need someone to talk to, please  call the Suicide and Crisis Lifeline: 988 call the [place] National Suicide Prevention Lifeline: 7638502395 or TTY: 617-444-4611 TTY 918-350-6923) to talk to a trained counselor call 1-800-273-TALK (toll free, 24 hour hotline) go to [facility] [address], [place] [phone]) call 911   Patient verbalizes understanding of instructions and care plan provided today and agrees to view in [place]. Active MyChart status confirmed with patient  Managing the Challenge of Quitting Smoking Quitting smoking is a physical and mental challenge. You will face cravings, withdrawal symptoms, and temptation. Before quitting, work with your health care provider to make a plan that can help you manage quitting. Preparation can help you quit and keep you from giving in. How to manage lifestyle changes Managing stress Stress can make you want to smoke, and wanting to smoke may cause stress. It is important to find ways to manage your stress. You might try some of the following: Practice relaxation techniques. Breathe slowly and deeply, in through your nose and out through your mouth. Listen to music. Soak in a bath or take a shower. Imagine a peaceful place or vacation. Get some support. Talk with family or friends about your stress. Join a support group. Talk with a counselor or therapist. Get some physical activity. Go for a walk, run, or bike ride. Play a favorite sport. Practice yoga.  Medicines Talk with your health care provider about medicines that might help you deal with cravings and make quitting easier for you. Relationships Social situations can be difficult when you are quitting smoking. To manage this, you can: Avoid parties and other social situations where people might be smoking. Avoid alcohol. Leave right away if you have the urge to smoke. Explain to your family and friends that you are quitting smoking. Ask for support and let them know  you might be a bit grumpy. Plan activities where smoking is not an option. General instructions Be aware that many people gain weight after they quit smoking. However, not everyone does. To keep from gaining weight, have a plan in place  before you quit and stick to the plan after you quit. Your plan should include: Having healthy snacks. When you have a craving, it may help to: Eat popcorn, carrots, celery, or other cut vegetables. Chew sugar-free gum. Changing how you eat. Eat small portion sizes at meals. Eat 4-6 small meals throughout the day instead of 1-2 large meals a day. Be mindful when you eat. Do not watch television or do other things that might distract you as you eat. Exercising regularly. Make time to exercise each day. If you do not have time for a long workout, do short bouts of exercise for 5-10 minutes several times a day. Do some form of strengthening exercise, such as weight lifting. Do some exercise that gets your heart beating and causes you to breathe deeply, such as walking fast, running, swimming, or biking. This is very important. Drinking plenty of water or other low-calorie or no-calorie drinks. Drink 6-8 glasses of water daily.  How to recognize withdrawal symptoms Your body and mind may experience discomfort as you try to get used to not having nicotine in your system. These effects are called withdrawal symptoms. They may include: Feeling hungrier than normal. Having trouble concentrating. Feeling irritable or restless. Having trouble sleeping. Feeling depressed. Craving a cigarette. To manage withdrawal symptoms: Avoid places, people, and activities that trigger your cravings. Remember why you want to quit. Get plenty of sleep. Avoid coffee and other caffeinated drinks. These may worsen some of your symptoms. These symptoms may surprise you. But be assured that they are normal to have when quitting smoking. How to manage cravings Come up with a plan for how to deal with your cravings. The plan should include the following: A definition of the specific situation you want to deal with. An alternative action you will take. A clear idea for how this action will help. The name of someone  who might help you with this. Cravings usually last for 5-10 minutes. Consider taking the following actions to help you with your plan to deal with cravings: Keep your mouth busy. Chew sugar-free gum. Suck on hard candies or a straw. Brush your teeth. Keep your hands and body busy. Change to a different activity right away. Squeeze or play with a ball. Do an activity or a hobby, such as making bead jewelry, practicing needlepoint, or working with wood. Mix up your normal routine. Take a short exercise break. Go for a quick walk or run up and down stairs. Focus on doing something kind or helpful for someone else. Call a friend or family member to talk during a craving. Join a support group. Contact a quitline. Where to find support To get help or find a support group: Call the [employer]'s Smoking Quitline: 1-800-QUIT NOW 321-431-4228) Visit the website of the Substance Abuse and [place]: ktimeonline.com Text QUIT to SmokefreeTXT: 147829 Where to find more information Visit these websites to find more information on quitting smoking: [place]: www.smokefree.gov American Lung Association: www.lung.org American Cancer Society: www.cancer.org Centers for Disease Control and Prevention: http://www.wolf.info/ American Heart Association: www.heart.org Contact a health care provider if: You want to change your plan for quitting. The medicines you are taking are not helping. Your eating feels out of  control or you cannot sleep. Get help right away if: You feel depressed or become very anxious. Summary Quitting smoking is a physical and mental challenge. You will face cravings, withdrawal symptoms, and temptation to smoke again. Preparation can help you as you go through these challenges. Try different techniques to manage stress, handle social situations, and prevent weight gain. You can deal with cravings by keeping your mouth busy (such as  by chewing gum), keeping your hands and body busy, calling family or friends, or contacting a quitline for people who want to quit smoking. You can deal with withdrawal symptoms by avoiding places where people smoke, getting plenty of rest, and avoiding drinks with caffeine. This information is not intended to replace advice given to you by your health care provider. Make sure you discuss any questions you have with your health care provider. Document Revised: 10/20/2020 Document Reviewed: 12/01/2018 Elsevier Patient Education  [place].
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
## 2021-05-07 DIAGNOSIS — J441 Chronic obstructive pulmonary disease with (acute) exacerbation: Secondary | ICD-10-CM | POA: Diagnosis not present

## 2021-05-07 DIAGNOSIS — J45909 Unspecified asthma, uncomplicated: Secondary | ICD-10-CM | POA: Diagnosis not present

## 2021-05-07 DIAGNOSIS — Z9989 Dependence on other enabling machines and devices: Secondary | ICD-10-CM | POA: Diagnosis not present

## 2021-05-07 DIAGNOSIS — F32A Depression, unspecified: Secondary | ICD-10-CM | POA: Diagnosis not present

## 2021-05-07 DIAGNOSIS — F17200 Nicotine dependence, unspecified, uncomplicated: Secondary | ICD-10-CM | POA: Diagnosis not present

## 2021-05-07 DIAGNOSIS — E1140 Type 2 diabetes mellitus with diabetic neuropathy, unspecified: Secondary | ICD-10-CM | POA: Diagnosis not present

## 2021-05-07 DIAGNOSIS — G4733 Obstructive sleep apnea (adult) (pediatric): Secondary | ICD-10-CM | POA: Diagnosis not present

## 2021-05-07 DIAGNOSIS — I10 Essential (primary) hypertension: Secondary | ICD-10-CM | POA: Diagnosis not present

## 2021-05-07 NOTE — Telephone Encounter (Signed)
Called pt and there was no answer-LMTCB °

## 2021-05-08 ENCOUNTER — Other Ambulatory Visit: Payer: Self-pay

## 2021-05-08 DIAGNOSIS — F17200 Nicotine dependence, unspecified, uncomplicated: Secondary | ICD-10-CM | POA: Diagnosis not present

## 2021-05-08 DIAGNOSIS — I10 Essential (primary) hypertension: Secondary | ICD-10-CM | POA: Diagnosis not present

## 2021-05-08 DIAGNOSIS — Z9989 Dependence on other enabling machines and devices: Secondary | ICD-10-CM | POA: Diagnosis not present

## 2021-05-08 DIAGNOSIS — F32A Depression, unspecified: Secondary | ICD-10-CM | POA: Diagnosis not present

## 2021-05-08 DIAGNOSIS — G4733 Obstructive sleep apnea (adult) (pediatric): Secondary | ICD-10-CM | POA: Diagnosis not present

## 2021-05-08 DIAGNOSIS — E1140 Type 2 diabetes mellitus with diabetic neuropathy, unspecified: Secondary | ICD-10-CM | POA: Diagnosis not present

## 2021-05-08 DIAGNOSIS — J45909 Unspecified asthma, uncomplicated: Secondary | ICD-10-CM | POA: Diagnosis not present

## 2021-05-08 DIAGNOSIS — J441 Chronic obstructive pulmonary disease with (acute) exacerbation: Secondary | ICD-10-CM | POA: Diagnosis not present

## 2021-05-08 MED ORDER — IPRATROPIUM-ALBUTEROL 20-100 MCG/ACT IN AERS: 1.0000 | INHALATION_SPRAY | Freq: Four times a day (QID) | RESPIRATORY_TRACT | 0 refills | Status: DC

## 2021-05-08 MED ORDER — BREZTRI AEROSPHERE 160-9-4.8 MCG/ACT IN AERO: 2.0000 | INHALATION_SPRAY | Freq: Two times a day (BID) | RESPIRATORY_TRACT | 0 refills | Status: DC

## 2021-05-08 MED ORDER — ROFLUMILAST 500 MCG PO TABS: 500.0000 ug | ORAL_TABLET | Freq: Every day | ORAL | 11 refills | Status: DC

## 2021-05-08 NOTE — Telephone Encounter (Signed)
Noted.  Patient is aware and voiced her understanding.  Nothing further needed.  

## 2021-05-08 NOTE — Telephone Encounter (Signed)
Spoke to patient.  ?She is requesting a Rx to be sent to [facility] for combivent, Breztri and [employer].  ? ?[place], please print Rx and have Dr. Elsworth Soho sign. Thanks ? ? ?

## 2021-05-10 DIAGNOSIS — J45909 Unspecified asthma, uncomplicated: Secondary | ICD-10-CM | POA: Diagnosis not present

## 2021-05-10 DIAGNOSIS — I10 Essential (primary) hypertension: Secondary | ICD-10-CM | POA: Diagnosis not present

## 2021-05-10 DIAGNOSIS — G4733 Obstructive sleep apnea (adult) (pediatric): Secondary | ICD-10-CM | POA: Diagnosis not present

## 2021-05-10 DIAGNOSIS — E1140 Type 2 diabetes mellitus with diabetic neuropathy, unspecified: Secondary | ICD-10-CM | POA: Diagnosis not present

## 2021-05-10 DIAGNOSIS — Z9989 Dependence on other enabling machines and devices: Secondary | ICD-10-CM | POA: Diagnosis not present

## 2021-05-10 DIAGNOSIS — F32A Depression, unspecified: Secondary | ICD-10-CM | POA: Diagnosis not present

## 2021-05-10 DIAGNOSIS — J441 Chronic obstructive pulmonary disease with (acute) exacerbation: Secondary | ICD-10-CM | POA: Diagnosis not present

## 2021-05-10 DIAGNOSIS — F17200 Nicotine dependence, unspecified, uncomplicated: Secondary | ICD-10-CM | POA: Diagnosis not present

## 2021-05-11 DIAGNOSIS — Z9989 Dependence on other enabling machines and devices: Secondary | ICD-10-CM | POA: Diagnosis not present

## 2021-05-11 DIAGNOSIS — E1140 Type 2 diabetes mellitus with diabetic neuropathy, unspecified: Secondary | ICD-10-CM | POA: Diagnosis not present

## 2021-05-11 DIAGNOSIS — F17200 Nicotine dependence, unspecified, uncomplicated: Secondary | ICD-10-CM | POA: Diagnosis not present

## 2021-05-11 DIAGNOSIS — J45909 Unspecified asthma, uncomplicated: Secondary | ICD-10-CM | POA: Diagnosis not present

## 2021-05-11 DIAGNOSIS — I10 Essential (primary) hypertension: Secondary | ICD-10-CM | POA: Diagnosis not present

## 2021-05-11 DIAGNOSIS — F32A Depression, unspecified: Secondary | ICD-10-CM | POA: Diagnosis not present

## 2021-05-11 DIAGNOSIS — G4733 Obstructive sleep apnea (adult) (pediatric): Secondary | ICD-10-CM | POA: Diagnosis not present

## 2021-05-11 DIAGNOSIS — J441 Chronic obstructive pulmonary disease with (acute) exacerbation: Secondary | ICD-10-CM | POA: Diagnosis not present

## 2021-05-14 DIAGNOSIS — Z9989 Dependence on other enabling machines and devices: Secondary | ICD-10-CM | POA: Diagnosis not present

## 2021-05-14 DIAGNOSIS — I10 Essential (primary) hypertension: Secondary | ICD-10-CM | POA: Diagnosis not present

## 2021-05-14 DIAGNOSIS — F32A Depression, unspecified: Secondary | ICD-10-CM | POA: Diagnosis not present

## 2021-05-14 DIAGNOSIS — E1140 Type 2 diabetes mellitus with diabetic neuropathy, unspecified: Secondary | ICD-10-CM | POA: Diagnosis not present

## 2021-05-14 DIAGNOSIS — J441 Chronic obstructive pulmonary disease with (acute) exacerbation: Secondary | ICD-10-CM | POA: Diagnosis not present

## 2021-05-14 DIAGNOSIS — F17200 Nicotine dependence, unspecified, uncomplicated: Secondary | ICD-10-CM | POA: Diagnosis not present

## 2021-05-14 DIAGNOSIS — J45909 Unspecified asthma, uncomplicated: Secondary | ICD-10-CM | POA: Diagnosis not present

## 2021-05-14 DIAGNOSIS — G4733 Obstructive sleep apnea (adult) (pediatric): Secondary | ICD-10-CM | POA: Diagnosis not present

## 2021-05-14 NOTE — Telephone Encounter (Signed)
Unable to locate any patient assistance paper work in [employer].   ?Phone ([phone]) call made to [employer] [facility] to get status on application.  After 30 minutes on hold, I spoke with Joan [name] who verified that she was approved on 03/12/21 and is approved through 03/12/2022.  The Combivent was mailed out on 03/16/21, but it was a wrong address.  [employer] reached out to her and she had moved, they received her new address.  It was to ship out on 2/20, but it appears it did not go out.  Joan [name] spoke with pharmacy, apparently after it was sent to her old address, it was marked as a refill requested too soon and did not go back out to the correct address and was cancelled.  Joan [name] has spoken with pharmacy and it will ship out today and she should receive it in 3-5 days. ?

## 2021-05-16 DIAGNOSIS — I10 Essential (primary) hypertension: Secondary | ICD-10-CM | POA: Diagnosis not present

## 2021-05-16 DIAGNOSIS — E1140 Type 2 diabetes mellitus with diabetic neuropathy, unspecified: Secondary | ICD-10-CM | POA: Diagnosis not present

## 2021-05-16 DIAGNOSIS — Z9989 Dependence on other enabling machines and devices: Secondary | ICD-10-CM | POA: Diagnosis not present

## 2021-05-16 DIAGNOSIS — J45909 Unspecified asthma, uncomplicated: Secondary | ICD-10-CM | POA: Diagnosis not present

## 2021-05-16 DIAGNOSIS — F32A Depression, unspecified: Secondary | ICD-10-CM | POA: Diagnosis not present

## 2021-05-16 DIAGNOSIS — G4733 Obstructive sleep apnea (adult) (pediatric): Secondary | ICD-10-CM | POA: Diagnosis not present

## 2021-05-16 DIAGNOSIS — J441 Chronic obstructive pulmonary disease with (acute) exacerbation: Secondary | ICD-10-CM | POA: Diagnosis not present

## 2021-05-16 DIAGNOSIS — F17200 Nicotine dependence, unspecified, uncomplicated: Secondary | ICD-10-CM | POA: Diagnosis not present

## 2021-05-16 NOTE — Telephone Encounter (Signed)
Erline Levine is calling to get approval to continue to work with pt. ?1 time a week for 6 weeks ? ?Voicemail is secure to you can leave detailed message  ?[phone] ?

## 2021-05-16 NOTE — Telephone Encounter (Signed)
New Note not needed ?

## 2021-05-17 ENCOUNTER — Telehealth: Payer: Self-pay | Admitting: Internal Medicine

## 2021-05-17 ENCOUNTER — Ambulatory Visit (INDEPENDENT_AMBULATORY_CARE_PROVIDER_SITE_OTHER): Payer: Medicare Other | Admitting: Licensed Clinical Social Worker

## 2021-05-17 DIAGNOSIS — R443 Hallucinations, unspecified: Secondary | ICD-10-CM

## 2021-05-17 DIAGNOSIS — J45909 Unspecified asthma, uncomplicated: Secondary | ICD-10-CM | POA: Diagnosis not present

## 2021-05-17 DIAGNOSIS — F411 Generalized anxiety disorder: Secondary | ICD-10-CM

## 2021-05-17 DIAGNOSIS — E1140 Type 2 diabetes mellitus with diabetic neuropathy, unspecified: Secondary | ICD-10-CM | POA: Diagnosis not present

## 2021-05-17 DIAGNOSIS — F0630 Mood disorder due to known physiological condition, unspecified: Secondary | ICD-10-CM

## 2021-05-17 DIAGNOSIS — I10 Essential (primary) hypertension: Secondary | ICD-10-CM | POA: Diagnosis not present

## 2021-05-17 DIAGNOSIS — F4310 Post-traumatic stress disorder, unspecified: Secondary | ICD-10-CM

## 2021-05-17 DIAGNOSIS — Z9989 Dependence on other enabling machines and devices: Secondary | ICD-10-CM | POA: Diagnosis not present

## 2021-05-17 DIAGNOSIS — F17200 Nicotine dependence, unspecified, uncomplicated: Secondary | ICD-10-CM | POA: Diagnosis not present

## 2021-05-17 DIAGNOSIS — J441 Chronic obstructive pulmonary disease with (acute) exacerbation: Secondary | ICD-10-CM | POA: Diagnosis not present

## 2021-05-17 DIAGNOSIS — F32A Depression, unspecified: Secondary | ICD-10-CM | POA: Diagnosis not present

## 2021-05-17 DIAGNOSIS — G4733 Obstructive sleep apnea (adult) (pediatric): Secondary | ICD-10-CM | POA: Diagnosis not present

## 2021-05-17 NOTE — Telephone Encounter (Signed)
Ok for verbals 

## 2021-05-17 NOTE — Telephone Encounter (Signed)
Pt requesting a rx for a wheelchair battery fax to [facility] ? ?Pt requesting a cb to discuss request ?

## 2021-05-17 NOTE — Telephone Encounter (Signed)
I advised Joan [name] that Dr. Sharlet Salina ok'ed the verbal order.

## 2021-05-17 NOTE — Telephone Encounter (Signed)
Spoke with the pt and she stated that [employer] did send someone out to look at her wheelchair and she was told that her battery was fine. However she did state that her wheelchair doesn't roll up all the way up, when I asked her to explain what she meant she was unable too. She also mentioned that she received a Accu Check meter but didn't get many test strips or lancets. She is asking if a script can [facility] sent to [facility] on [facility].  ?

## 2021-05-17 NOTE — Progress Notes (Signed)
?Virtual Visit via Video Note ? ?I connected with Joan [name] on 05/17/21 at  2:00 PM EDT by a video enabled telemedicine application and verified that I am speaking with the correct person using two identifiers. ? ?Location: ?Patient: home ?Provider: home office ?  ?I discussed the limitations of evaluation and management by telemedicine and the availability of in person appointments. The patient expressed understanding and agreed to proceed. ?  ?I discussed the assessment and treatment plan with the patient. The patient was provided an opportunity to ask questions and all were answered. The patient agreed with the plan and demonstrated an understanding of the instructions. ?  ?The patient was advised to call back or seek an in-person evaluation if the symptoms worsen or if the condition fails to improve as anticipated. ? ?I provided 54 minutes of non-face-to-face time during this encounter. ? ?THERAPIST PROGRESS NOTE ? ?Session Time: 2:00 PM to 2:54 PM ? ?Participation Level: Active ? ?Behavioral Response: CasualAlertEuthymic ? ?Type of Therapy: Individual Therapy ? ?Treatment Goals addressed: PTSD, coping, wants to work on where she is right now and work through her childhood when things stabilize, she noted anxiety depression stress management with dealing with things right now ? ?ProgressTowards Goals: Progressing-worked actively on treatment goal of stress management note as well patient living situation is starting to stabilize this is positive and also means we can explore deeper issues sooner ? ?Interventions: CBT, Solution Focused, Strength-based, Supportive, and Other: coping ? ?Summary: [name] is a 61 y.o. female who gave therapist update that she was presented with heating bill from January $565. In February $367.00 Total bill $1000.00. There was another person in the apartment patient didn't turn up the heat.  Patient asked for landlord to send the bill and only center half the  bill unclear where it was. Noted three ladies together including [name] and lisa bought the house. They want to make is so converted to assisted living, called [facility]. Never got the ok name of place in limbo. Rent to [facility] right now. [name] came to patent with a contract, included in it were things that were not agreed to at the beginning such as paying utilities over $250. Patient says not signing it doesn't know what else hiding.  Advice from family who rent was not to sign it. Left it alone not sure why [name] didn't want to talk about in front of [place]. Don't trust Janae Bridgeman think she is snake. At the beginning she said utilities come with the place now saying something different. Patient was a first worried sick didn't get out of bed for a couple days didn't know what to do niece say don't sign anything.  Therapist explored how this was set up at the beginning patient said didn't sign a contract. Patient is ok with rent going up when flip that was mentioned but problem with paying extra for utilities. Should be happy with her giving rent and cleaning. Beginning of March when finally got stuff in room moved in now and settled now can move on to clean the house. Likes it there. See ghosts only able to do it since [place] died. Heard  him and mother in kitchen. Hear them but can't see them Saw aunts through the window, they were climbing through the window saw ghosts through the hutch through the glass if look it is a shadow. Someone heard something and patient said that is the ghost. Believes that they are there with her. Sounded  like somebody fiddled around in the kitchen walked into the room and the door was locked. One morning to get up the bright kitchen light on doesn't use it unless doing something and then shut it off. Went into the laundry room this she doesn't think a ghost think somebody has the key all laundry soap gone. Fabric softener the same way. That was after tenant came in left  to go to work and came back and gave key to her said moving. He had complained to landlord about her TV being loud, coughing all night. Patient says he is a Control and instrumentation engineer. Has a medication with Hydrocodone and after take knocked out.  Ever told her only told landlord this about noise. Patient thinks he stole laundry detergent and softener. Hears something in the kitchen therapist doesn't hear it patient goes and checks and says nothing there. The other day the freezer went down. Lost all the food $100 or more. Had to call sister glad she came so at least she froze it so edible. [name] only one who comes and [place] when they take her [name] when she has to.  Therapist asked if patient scared of goes and patient says sometimes yells at them. Place homey now a lot of work cleaning wise.  Therapist discussed developing a life what that would look like for patient and patient likes the way things are she explains she can do what she wants when she wants to, when feel like doing it. Will have a floor day, a window sill day. Lonely sometimes but mostly good. Sometimes confused feel should be somewhere else and then tells herself she has no one else to be this is where is supposed to be. Things to keep her busy sometimes doesn't want to do anything. Does what she wants when she wants goes out when wants and doesn't have anyone to worry about it and likes it like that, just take care of herself.  Even if she has company she will think I want a lay down but also by the end of the day enjoys her company.     ? ?Therapist reviewed symptoms, facilitated expression of thoughts and feelings utilize processing feelings patient to help cope with recent stressors house related.  Gust coping strategies for stressors and therapist validated patient on managing these stressors.  Brought up the subject of ghosts and therapist pointed out she is being treated for hallucinations patient really feels that it is there and will talk to Dr. About it.   Discussed in general how she is doing and patient says likes the freedom of her lifestyle can do what she wants when she wants it able to enjoy that.  Therapist provided support and space to patient as she shared her thoughts and feelings in session.  Noted positive patient feels moved in she likes where she is and does not want to move. That is a good sign.  ? ?Suicidal/Homicidal: No ? ?Plan: Return again in 2 weeks. 2.Continue to explore patient's feelings in session for insight that will help with coping, stress management and mood management ? ?Diagnosis: PTSD, hallucinations, GAD, Mood Disorder in conditions classified elsewhere ? ?Collaboration of Care: Other none needed ? ?Patient/Guardian was advised Release of Information must be obtained prior to any record release in order to collaborate their care with an outside provider. Patient/Guardian was advised if they have not already done so to contact the registration department to sign all necessary forms in order for [place] to release information  regarding their care.  ? ?Consent: Patient/Guardian gives verbal consent for treatment and assignment of benefits for services provided during this visit. Patient/Guardian expressed understanding and agreed to proceed.  ? ?[name], LCSW ?05/17/2021 ? ?

## 2021-05-18 NOTE — Telephone Encounter (Signed)
Okay to send in lancets and strips.  ?

## 2021-05-21 ENCOUNTER — Encounter: Payer: Self-pay | Admitting: Internal Medicine

## 2021-05-21 DIAGNOSIS — J45909 Unspecified asthma, uncomplicated: Secondary | ICD-10-CM | POA: Diagnosis not present

## 2021-05-21 DIAGNOSIS — F32A Depression, unspecified: Secondary | ICD-10-CM | POA: Diagnosis not present

## 2021-05-21 DIAGNOSIS — Z9989 Dependence on other enabling machines and devices: Secondary | ICD-10-CM | POA: Diagnosis not present

## 2021-05-21 DIAGNOSIS — G4733 Obstructive sleep apnea (adult) (pediatric): Secondary | ICD-10-CM | POA: Diagnosis not present

## 2021-05-21 DIAGNOSIS — F17200 Nicotine dependence, unspecified, uncomplicated: Secondary | ICD-10-CM | POA: Diagnosis not present

## 2021-05-21 DIAGNOSIS — I10 Essential (primary) hypertension: Secondary | ICD-10-CM | POA: Diagnosis not present

## 2021-05-21 DIAGNOSIS — E1140 Type 2 diabetes mellitus with diabetic neuropathy, unspecified: Secondary | ICD-10-CM | POA: Diagnosis not present

## 2021-05-21 DIAGNOSIS — J441 Chronic obstructive pulmonary disease with (acute) exacerbation: Secondary | ICD-10-CM | POA: Diagnosis not present

## 2021-05-21 MED ORDER — GLUCOSE BLOOD VI STRP: ORAL_STRIP | 12 refills | Status: DC

## 2021-05-21 MED ORDER — ACCU-CHEK SOFTCLIX LANCETS MISC: 12 refills | Status: DC

## 2021-05-21 NOTE — Telephone Encounter (Signed)
Test strips and lancets have been sent to the pt's pharmacy  ?

## 2021-05-22 ENCOUNTER — Encounter: Payer: Medicare Other | Admitting: Internal Medicine

## 2021-05-23 DIAGNOSIS — I10 Essential (primary) hypertension: Secondary | ICD-10-CM | POA: Diagnosis not present

## 2021-05-23 DIAGNOSIS — E1140 Type 2 diabetes mellitus with diabetic neuropathy, unspecified: Secondary | ICD-10-CM | POA: Diagnosis not present

## 2021-05-23 DIAGNOSIS — J441 Chronic obstructive pulmonary disease with (acute) exacerbation: Secondary | ICD-10-CM | POA: Diagnosis not present

## 2021-05-23 DIAGNOSIS — Z9989 Dependence on other enabling machines and devices: Secondary | ICD-10-CM | POA: Diagnosis not present

## 2021-05-23 DIAGNOSIS — G4733 Obstructive sleep apnea (adult) (pediatric): Secondary | ICD-10-CM | POA: Diagnosis not present

## 2021-05-23 DIAGNOSIS — F17200 Nicotine dependence, unspecified, uncomplicated: Secondary | ICD-10-CM | POA: Diagnosis not present

## 2021-05-23 DIAGNOSIS — J45909 Unspecified asthma, uncomplicated: Secondary | ICD-10-CM | POA: Diagnosis not present

## 2021-05-23 DIAGNOSIS — F32A Depression, unspecified: Secondary | ICD-10-CM | POA: Diagnosis not present

## 2021-05-25 DIAGNOSIS — Z9989 Dependence on other enabling machines and devices: Secondary | ICD-10-CM | POA: Diagnosis not present

## 2021-05-25 DIAGNOSIS — F32A Depression, unspecified: Secondary | ICD-10-CM | POA: Diagnosis not present

## 2021-05-25 DIAGNOSIS — G4733 Obstructive sleep apnea (adult) (pediatric): Secondary | ICD-10-CM | POA: Diagnosis not present

## 2021-05-25 DIAGNOSIS — F17200 Nicotine dependence, unspecified, uncomplicated: Secondary | ICD-10-CM | POA: Diagnosis not present

## 2021-05-25 DIAGNOSIS — J45909 Unspecified asthma, uncomplicated: Secondary | ICD-10-CM | POA: Diagnosis not present

## 2021-05-25 DIAGNOSIS — I10 Essential (primary) hypertension: Secondary | ICD-10-CM

## 2021-05-25 DIAGNOSIS — J449 Chronic obstructive pulmonary disease, unspecified: Secondary | ICD-10-CM | POA: Diagnosis not present

## 2021-05-25 DIAGNOSIS — E1140 Type 2 diabetes mellitus with diabetic neuropathy, unspecified: Secondary | ICD-10-CM | POA: Diagnosis not present

## 2021-05-25 DIAGNOSIS — J441 Chronic obstructive pulmonary disease with (acute) exacerbation: Secondary | ICD-10-CM | POA: Diagnosis not present

## 2021-05-28 DIAGNOSIS — I10 Essential (primary) hypertension: Secondary | ICD-10-CM | POA: Diagnosis not present

## 2021-05-28 DIAGNOSIS — F17200 Nicotine dependence, unspecified, uncomplicated: Secondary | ICD-10-CM | POA: Diagnosis not present

## 2021-05-28 DIAGNOSIS — E1140 Type 2 diabetes mellitus with diabetic neuropathy, unspecified: Secondary | ICD-10-CM | POA: Diagnosis not present

## 2021-05-28 DIAGNOSIS — Z9989 Dependence on other enabling machines and devices: Secondary | ICD-10-CM | POA: Diagnosis not present

## 2021-05-28 DIAGNOSIS — G4733 Obstructive sleep apnea (adult) (pediatric): Secondary | ICD-10-CM | POA: Diagnosis not present

## 2021-05-28 DIAGNOSIS — J45909 Unspecified asthma, uncomplicated: Secondary | ICD-10-CM | POA: Diagnosis not present

## 2021-05-28 DIAGNOSIS — J441 Chronic obstructive pulmonary disease with (acute) exacerbation: Secondary | ICD-10-CM | POA: Diagnosis not present

## 2021-05-28 DIAGNOSIS — F32A Depression, unspecified: Secondary | ICD-10-CM | POA: Diagnosis not present

## 2021-05-29 ENCOUNTER — Telehealth: Payer: Self-pay | Admitting: Student

## 2021-05-29 DIAGNOSIS — F17200 Nicotine dependence, unspecified, uncomplicated: Secondary | ICD-10-CM | POA: Diagnosis not present

## 2021-05-29 DIAGNOSIS — J45909 Unspecified asthma, uncomplicated: Secondary | ICD-10-CM | POA: Diagnosis not present

## 2021-05-29 DIAGNOSIS — F32A Depression, unspecified: Secondary | ICD-10-CM | POA: Diagnosis not present

## 2021-05-29 DIAGNOSIS — J441 Chronic obstructive pulmonary disease with (acute) exacerbation: Secondary | ICD-10-CM | POA: Diagnosis not present

## 2021-05-29 DIAGNOSIS — Z9989 Dependence on other enabling machines and devices: Secondary | ICD-10-CM | POA: Diagnosis not present

## 2021-05-29 DIAGNOSIS — E1140 Type 2 diabetes mellitus with diabetic neuropathy, unspecified: Secondary | ICD-10-CM | POA: Diagnosis not present

## 2021-05-29 DIAGNOSIS — G4733 Obstructive sleep apnea (adult) (pediatric): Secondary | ICD-10-CM | POA: Diagnosis not present

## 2021-05-29 DIAGNOSIS — I10 Essential (primary) hypertension: Secondary | ICD-10-CM | POA: Diagnosis not present

## 2021-05-29 NOTE — Telephone Encounter (Signed)
Attempted to contact patient to offer to schedule a Palliative Consult, no answer - left message with reason for call along with my name and call back number, requesting a return call to schedule visit. ?

## 2021-05-30 ENCOUNTER — Ambulatory Visit: Payer: Medicare Other | Admitting: Gastroenterology

## 2021-05-31 ENCOUNTER — Ambulatory Visit (INDEPENDENT_AMBULATORY_CARE_PROVIDER_SITE_OTHER): Payer: Medicare Other | Admitting: Licensed Clinical Social Worker

## 2021-05-31 ENCOUNTER — Telehealth: Payer: Self-pay | Admitting: Student

## 2021-05-31 DIAGNOSIS — F0630 Mood disorder due to known physiological condition, unspecified: Secondary | ICD-10-CM | POA: Diagnosis not present

## 2021-05-31 DIAGNOSIS — F4310 Post-traumatic stress disorder, unspecified: Secondary | ICD-10-CM

## 2021-05-31 DIAGNOSIS — F411 Generalized anxiety disorder: Secondary | ICD-10-CM | POA: Diagnosis not present

## 2021-05-31 NOTE — Telephone Encounter (Signed)
Ret'd call to patient and discussed the Palliative referral/services with her and all questions were answered and she was in agreement with scheduling visit.  I have scheduled a MyChart Video Consult for 06/19/21 @ 2:30 PM ?

## 2021-05-31 NOTE — Progress Notes (Signed)
Virtual Visit via Video Note ? ?I connected with Joan [name] on 05/31/21 at  2:00 PM EDT by a video enabled telemedicine application and verified that I am speaking with the correct person using two identifiers. ? ?Location: ?Patient: home ?Provider: home office ?  ?I discussed the limitations of evaluation and management by telemedicine and the availability of in person appointments. The patient expressed understanding and agreed to proceed. ?  ?I discussed the assessment and treatment plan with the patient. The patient was provided an opportunity to ask questions and all were answered. The patient agreed with the plan and demonstrated an understanding of the instructions. ?  ?The patient was advised to call back or seek an in-person evaluation if the symptoms worsen or if the condition fails to improve as anticipated. ? ?I provided 53 minutes of non-face-to-face time during this encounter. ? ?THERAPIST PROGRESS NOTE ? ?Session Time: 2:00 PM to 2:53 PM ? ?Participation Level: Active ? ?Behavioral Response: CasualAlertEuphoric and Irritable ? ?Type of Therapy: Individual Therapy ? ?Treatment Goals addressed: PTSD, coping, wants to work on where she is right now and work through her childhood when things stabilize, she noted anxiety depression stress management with dealing with things right now ? ?ProgressTowards Goals: Progressing-patient actively working through feelings and frustrations in session to help with coping using therapy actively to work on issues ? ?Interventions: Solution Focused, Strength-based, Supportive, and Other: Coping ? ?Summary: Joan [name] is a 61 y.o. female who presents with have no water in the house shut off. Called Joan [name] and said rather talk to you because things get accomplished when call her. Started talking haven't seen Joan [name] cleaning comes over once a month. Joan [name] thinks she is coming to clean regularly. Gas bill up to $1000 and Joan [name] didn't know. Barely sees Joan [name]  even though Joan [name] thinks come to clean and patient says the house is filthy. Told her don't trust Joan [name] now coming with water bill notice for shut off. $135 never that high  Insinuated it was patient but doesn't use water like that. Joan [name] then says the bill is over $200. Joan [name] all put on phone to get the bottom of them. Don't get a shut off notice for one bill. Patient realizes and says whatever she is telling you not paying the bill and trying to get it half paid from the tenants. Tenants only supposed to pay if over $250. Start talking Joan [name] jump in defending herself. Supposedly paid water bill last night. She is looking for ways for her to put patient  out. Supposed to be taking care of finances and obviously not and what patient sees is she is using money for whatever when comes down to it the bill is high will say it is just that one month but not possible that is other payments not paying. Everything supposed to be included. Joan [name] said thought about selling the house can't get people to take care of it the way they should. Niece advises patient that she is putting the house on market left where no money and no where to go don't have to pay rent. Additionally other issues water pipe clog blame patient with flushable wipes.  As patient says to be continued not sure where going with the landlord. Met someone. Told him not looking for relationship, a friendship. Asked him to stop calling told him she was done. Laughing at first but got annoying. Don't want to be with anybody way hounding her not enjoying her days dreading when  the phone rang. Tried to be friend but he wouldn't stop. Showing her that he is going to dominate her that was her last straw. Like life, likes freedom.  Patient said even men you love have negative qualities.  With her husband when not there visiting other people another story talk about her addiction and she wanting his money. Loved him. When found all this out was so mad. He  gave all  to kids and was angry because they lived on her settlement. Dated for 5 years married 10 years. He was getting high at home Joan [name]) because at home and doing prescription drugs he didn't consider himself an addict.  Thought about dating and going out and likes being at home. Nothing holding back to what she wants to do and it is nice. The person who talks to her is [name]. With spiritual practices on and off all day. Likes Tax adviser opened herself to [name]. Worship church what she is looking to join. [employer]. Non-denominational. [employer] and loved the church when went to non-denominational.      ? ?Therapist reviewed symptoms, facilitated expression of thoughts and feelings noted helpful to use this treatment intervention as patient processed feelings related to frustrations with landlord, also recent experience of somebody who wanted to date her.  This praised patient because she is carefully listening to what one of the landlord says and can catch them and lies and therapist said you have to really listen carefully to be able to challenge them on their contradictions which patient can do so actually takes skilled to do that.  Validated patient on her frustrations.  Patient shared about a man interested on her and patient able to use humor that therapist felt very positive for patient and therapist at the same time reaffirmed patient wanting to be single and freedom is the best option therapist also added that with this kind of situation you really need to be careful.  Ended session by talking about spirituality and how helpful it is and how much patient engages in this activity.  Therapist provided active listening open questions supportive interventions ?Suicidal/Homicidal: No ? ?Plan: Return again in 2 weeks.2.Continue to explore patient's feelings in session for insight that will help with coping, stress management and mood management ? ? ?Diagnosis: PTSD, hallucinations, GAD, Mood Disorder in  conditions classified elsewhere ? ?Collaboration of Care: Other none needed ? ?Patient/Guardian was advised Release of Information must be obtained prior to any record release in order to collaborate their care with an outside provider. Patient/Guardian was advised if they have not already done so to contact the registration department to sign all necessary forms in order for [place] to release information regarding their care.  ? ?Consent: Patient/Guardian gives verbal consent for treatment and assignment of benefits for services provided during this visit. Patient/Guardian expressed understanding and agreed to proceed.  ? ?[name], LCSW ?05/31/2021 ? ?

## 2021-06-01 DIAGNOSIS — F17200 Nicotine dependence, unspecified, uncomplicated: Secondary | ICD-10-CM | POA: Diagnosis not present

## 2021-06-01 DIAGNOSIS — F32A Depression, unspecified: Secondary | ICD-10-CM | POA: Diagnosis not present

## 2021-06-01 DIAGNOSIS — J441 Chronic obstructive pulmonary disease with (acute) exacerbation: Secondary | ICD-10-CM | POA: Diagnosis not present

## 2021-06-01 DIAGNOSIS — J45909 Unspecified asthma, uncomplicated: Secondary | ICD-10-CM | POA: Diagnosis not present

## 2021-06-01 DIAGNOSIS — I10 Essential (primary) hypertension: Secondary | ICD-10-CM | POA: Diagnosis not present

## 2021-06-01 DIAGNOSIS — G4733 Obstructive sleep apnea (adult) (pediatric): Secondary | ICD-10-CM | POA: Diagnosis not present

## 2021-06-01 DIAGNOSIS — Z9989 Dependence on other enabling machines and devices: Secondary | ICD-10-CM | POA: Diagnosis not present

## 2021-06-01 DIAGNOSIS — E1140 Type 2 diabetes mellitus with diabetic neuropathy, unspecified: Secondary | ICD-10-CM | POA: Diagnosis not present

## 2021-06-05 ENCOUNTER — Telehealth: Payer: Self-pay

## 2021-06-05 DIAGNOSIS — F32A Depression, unspecified: Secondary | ICD-10-CM | POA: Diagnosis not present

## 2021-06-05 DIAGNOSIS — J441 Chronic obstructive pulmonary disease with (acute) exacerbation: Secondary | ICD-10-CM | POA: Diagnosis not present

## 2021-06-05 DIAGNOSIS — E1140 Type 2 diabetes mellitus with diabetic neuropathy, unspecified: Secondary | ICD-10-CM | POA: Diagnosis not present

## 2021-06-05 DIAGNOSIS — I10 Essential (primary) hypertension: Secondary | ICD-10-CM | POA: Diagnosis not present

## 2021-06-05 DIAGNOSIS — J45909 Unspecified asthma, uncomplicated: Secondary | ICD-10-CM | POA: Diagnosis not present

## 2021-06-05 DIAGNOSIS — F17200 Nicotine dependence, unspecified, uncomplicated: Secondary | ICD-10-CM | POA: Diagnosis not present

## 2021-06-05 DIAGNOSIS — Z9989 Dependence on other enabling machines and devices: Secondary | ICD-10-CM | POA: Diagnosis not present

## 2021-06-05 DIAGNOSIS — G4733 Obstructive sleep apnea (adult) (pediatric): Secondary | ICD-10-CM | POA: Diagnosis not present

## 2021-06-05 NOTE — Progress Notes (Signed)
? ? ?Chronic Care Management ?Pharmacy Assistant  ? ?Name: Joan [name]  MRN: [id number] DOB: Dec 03, 1960 ? ?Joan [name] is an 61 y.o. year old female who was called for his follow-up assessment call.  ? ?Reason for Encounter: Disease State ?  ?Conditions to be addressed/monitored: ?COPD ? ? ?Recent office visits:  ?None ID ? ?Recent consult visits:  ?None ID ? ?Hospital visits:  ?None since last coordination call ? ?Medications: ?Outpatient Encounter Medications as of 06/05/2021  ?Medication Sig  ? Accu-Chek Softclix Lancets lancets Use as instructed  ? acyclovir (ZOVIRAX) 400 MG tablet TAKE 1 TABLET BY MOUTH  TWICE DAILY (Patient taking differently: 400 mg daily.)  ? albuterol (PROVENTIL) (2.5 MG/3ML) 0.083% nebulizer solution Take 3 mLs (2.5 mg total) by nebulization 2 (two) times daily. And every 6 hours as needed.  DX: J44.9  ? atenolol (TENORMIN) 25 MG tablet Take 25 mg by mouth daily.  ? atorvastatin (LIPITOR) 20 MG tablet TAKE 1 TABLET BY MOUTH  DAILY  ? blood glucose meter kit and supplies Dispense based on patient and insurance preference. Use up to two times daily as directed. (FOR ICD-10 E10.9, E11.9).  ? Budeson-Glycopyrrol-Formoterol (BREZTRI AEROSPHERE) 160-9-4.8 MCG/ACT AERO Inhale 2 puffs into the lungs in the morning and at bedtime.  ? Budeson-Glycopyrrol-Formoterol (BREZTRI AEROSPHERE) 160-9-4.8 MCG/ACT AERO Inhale 2 puffs into the lungs in the morning and at bedtime.  ? buPROPion (WELLBUTRIN XL) 150 MG 24 hr tablet TAKE 1 TABLET BY MOUTH  DAILY  ? busPIRone (BUSPAR) 5 MG tablet TAKE 1/2 TO 1 TABLET BY MOUTH  TWICE DAILY AS NEEDED  ? Camphor-Menthol-Methyl Sal (SALONPAS) 3.03-02-08 % PTCH Apply 1 patch topically daily as needed.  ? cyclobenzaprine (FLEXERIL) 10 MG tablet Take 10 mg by mouth at bedtime.  ? glucose blood test strip Use as instructed  ? hydrochlorothiazide (HYDRODIURIL) 25 MG tablet TAKE 1 TABLET BY MOUTH  DAILY  ? Ipratropium-Albuterol (COMBIVENT RESPIMAT) 20-100 MCG/ACT  AERS respimat Inhale 1 puff into the lungs every 6 (six) hours as needed for wheezing or shortness of breath.  ? Ipratropium-Albuterol (COMBIVENT) 20-100 MCG/ACT AERS respimat Inhale 1 puff into the lungs every 6 (six) hours.  ? levothyroxine (SYNTHROID) 25 MCG tablet TAKE 1 TABLET BY MOUTH  DAILY BEFORE BREAKFAST  ? Liniments (SALONPAS ARTHRITIS PAIN RELIEF EX) Apply 1 patch topically daily as needed (to painful sites- remove as directed).   ? methylPREDNISolone (MEDROL DOSEPAK) 4 MG TBPK tablet As per medrol dose pack instructions  ? montelukast (SINGULAIR) 10 MG tablet TAKE 1 TABLET BY MOUTH AT  BEDTIME  ? nystatin (MYCOSTATIN) 100000 UNIT/ML suspension Take 5 mLs (500,000 Units total) by mouth 4 (four) times daily.  ? nystatin-triamcinolone ointment (MYCOLOG) Apply 1 application topically 2 (two) times daily as needed (applied to skin folds/groins/under breast skin irritation rash.).  ? OXYGEN Inhale 3 L/min into the lungs See admin instructions. Inhale  3 L/min of oxygen into the lungs w/CPAP at bedtime and as needed for shortness of breath with "strenuous activity" during the day  ? pantoprazole (PROTONIX) 40 MG tablet TAKE 1 TABLET BY MOUTH  DAILY  ? PATADAY 0.2 % SOLN Apply 1 drop to eye daily.  ? polyethylene glycol (MIRALAX / GLYCOLAX) 17 g packet Take 17 g by mouth daily as needed for mild constipation.  ? pregabalin (LYRICA) 150 MG capsule TAKE 1 CAPSULE BY MOUTH AT  BEDTIME  ? QUEtiapine (SEROQUEL) 50 MG tablet TAKE 1 TABLET BY MOUTH AT  BEDTIME  ?  roflumilast (DALIRESP) 500 MCG TABS tablet Take 1 tablet (500 mcg total) by mouth daily.  ? roflumilast (DALIRESP) 500 MCG TABS tablet Take 1 tablet (500 mcg total) by mouth daily.  ? traZODone (DESYREL) 100 MG tablet TAKE 1 TABLET BY MOUTH AT  BEDTIME  ? varenicline (CHANTIX) 1 MG tablet Take 1 mg by mouth 2 (two) times daily.  ? venlafaxine XR (EFFEXOR-XR) 150 MG 24 hr capsule TAKE 1 CAPSULE BY MOUTH  DAILY WITH BREAKFAST  ? witch hazel-glycerin (TUCKS) pad  Apply topically as needed for itching.  ? ?No facility-administered encounter medications on file as of 06/05/2021.  ? ? ?  04/21/2020  ?  2:22 PM  ?CAT Score  ?Total CAT Score 39  ? ?Current COPD regimen: ? Combivent 20-100 mcg/act ? Breztri aer 160-9-4.8 mcg/act ? ? Any recent hospitalizations or ED visits since last visit with CPP? No, not since last coordination call with CCM RN ? ?Report COPD symptoms, including Increased shortness of breath , wheezing, not sleeping, patient states she believes she will go out to the ED if she does not start feeling better today ? ?What recent interventions/DTPs have been made by any provider to improve breathing since last visit: Patient states that she has stop smoking  ? ?Have you had exacerbation/flare-up since last visit? Yes, patient states that she has more wheezing and SOB, fatigue and having trouble sleeping through the night ? ?What do you do when you are short of breath?  Otherpatient states that she will rest but if that does not do any good she will use rescue inhaler ? ?Respiratory Devices/Equipment ?Do you have a nebulizer? No ?Do you use a Peak Flow Meter? No ?Do you use a maintenance inhaler? Yes ?How often do you forget to use your daily inhaler? Not much ?Do you use a rescue inhaler? Yes ?How often do you use your rescue inhaler?   Patient states that she has been using more with the sob ?Do you use a spacer with your inhaler? Yes ? ?Adherence Review: ?Does the patient have >5 day gap between last estimated fill date for maintenance inhaler medications? No ? ? Care Gaps: ?Colonoscopy-11/24/18 ?Diabetic Foot Exam-03/27/21 ?Mammogram-06/08/19 ?Ophthalmology-NA ?Dexa Scan - NA ?Annual Well Visit - NA ?Micro albumin-03/27/21 ?Hemoglobin A1c- 03/14/21 ?  ?Star Rating Drugs: ?Atorvastatin 20 mg-last fill 05/13/21 100 ds ?  ?  ?Ethelene Hal ?Clinical Pharmacist Assistant ?[phone]  ?

## 2021-06-06 DIAGNOSIS — E1140 Type 2 diabetes mellitus with diabetic neuropathy, unspecified: Secondary | ICD-10-CM | POA: Diagnosis not present

## 2021-06-06 DIAGNOSIS — J45909 Unspecified asthma, uncomplicated: Secondary | ICD-10-CM | POA: Diagnosis not present

## 2021-06-06 DIAGNOSIS — G4733 Obstructive sleep apnea (adult) (pediatric): Secondary | ICD-10-CM | POA: Diagnosis not present

## 2021-06-06 DIAGNOSIS — J441 Chronic obstructive pulmonary disease with (acute) exacerbation: Secondary | ICD-10-CM | POA: Diagnosis not present

## 2021-06-06 DIAGNOSIS — Z9989 Dependence on other enabling machines and devices: Secondary | ICD-10-CM | POA: Diagnosis not present

## 2021-06-06 DIAGNOSIS — F17200 Nicotine dependence, unspecified, uncomplicated: Secondary | ICD-10-CM | POA: Diagnosis not present

## 2021-06-06 DIAGNOSIS — I10 Essential (primary) hypertension: Secondary | ICD-10-CM | POA: Diagnosis not present

## 2021-06-06 DIAGNOSIS — F32A Depression, unspecified: Secondary | ICD-10-CM | POA: Diagnosis not present

## 2021-06-07 DIAGNOSIS — J45909 Unspecified asthma, uncomplicated: Secondary | ICD-10-CM | POA: Diagnosis not present

## 2021-06-07 DIAGNOSIS — I10 Essential (primary) hypertension: Secondary | ICD-10-CM | POA: Diagnosis not present

## 2021-06-07 DIAGNOSIS — F17200 Nicotine dependence, unspecified, uncomplicated: Secondary | ICD-10-CM | POA: Diagnosis not present

## 2021-06-07 DIAGNOSIS — Z9989 Dependence on other enabling machines and devices: Secondary | ICD-10-CM | POA: Diagnosis not present

## 2021-06-07 DIAGNOSIS — E1140 Type 2 diabetes mellitus with diabetic neuropathy, unspecified: Secondary | ICD-10-CM | POA: Diagnosis not present

## 2021-06-07 DIAGNOSIS — G4733 Obstructive sleep apnea (adult) (pediatric): Secondary | ICD-10-CM | POA: Diagnosis not present

## 2021-06-07 DIAGNOSIS — F32A Depression, unspecified: Secondary | ICD-10-CM | POA: Diagnosis not present

## 2021-06-07 DIAGNOSIS — J441 Chronic obstructive pulmonary disease with (acute) exacerbation: Secondary | ICD-10-CM | POA: Diagnosis not present

## 2021-06-08 ENCOUNTER — Telehealth (INDEPENDENT_AMBULATORY_CARE_PROVIDER_SITE_OTHER): Payer: Medicare Other | Admitting: Psychiatry

## 2021-06-08 ENCOUNTER — Encounter (HOSPITAL_COMMUNITY): Payer: Self-pay | Admitting: Psychiatry

## 2021-06-08 DIAGNOSIS — F411 Generalized anxiety disorder: Secondary | ICD-10-CM

## 2021-06-08 DIAGNOSIS — F0630 Mood disorder due to known physiological condition, unspecified: Secondary | ICD-10-CM

## 2021-06-08 NOTE — Progress Notes (Signed)
[place] Follow up visit ? ?Patient Identification: Joan [name] ?MRN:  [id number] ?Date of Evaluation:  06/08/2021 ?Referral Source:Therapist [name] ?Chief Complaint:   ? depression follow up  ?Visit Diagnosis:  ?  ICD-10-CM   ?1. Mood disorder in conditions classified elsewhere  F06.30   ?  ?2. Generalized anxiety disorder  F41.1   ?  ?3. GAD (generalized anxiety disorder)  F41.1   ?  ? ? ?Virtual Visit via Video Note ? ?I connected with Joan [name] on 06/08/21 at 12:00 PM EDT by a video enabled telemedicine application and verified that I am speaking with the correct person using two identifiers. ? ?Location: ?Patient: home ?Provider: home office ?  ?I discussed the limitations of evaluation and management by telemedicine and the availability of in person appointments. The patient expressed understanding and agreed to proceed. ? ? ? ?  ?I discussed the assessment and treatment plan with the patient. The patient was provided an opportunity to ask questions and all were answered. The patient agreed with the plan and demonstrated an understanding of the instructions. ?  ?The patient was advised to call back or seek an in-person evaluation if the symptoms worsen or if the condition fails to improve as anticipated. ? ?I provided 20 minutes of non-face-to-face time during this encounter. ? ? ? ? ? ?History of Present Illness: Patient is a 61 years old currently widow Caucasian female initially referred by her therapist for management of mood symptoms, depression possible PTSD and anxiety ? ?Patient was in rehab after being admitted for COPD exacerbation ? ?Past admissions to hospital for COPC ?On seroquel, trazadone for sleep still stuggles plans to see Sleep clinic review cpap  ?Also on wellbutrin, effexor ?Lives with a friend  ?Gets lonely, medical co morbidity keeps her limited  ?Trying to read books or puzzle ? ?Trying to work on her smoking and copd  ? ?Aggravating factor; difficult childhood, grief,  difficult relationship with middle kid, hip replacement medical comorbidity, lonliness ?Modifying factor: sibling ? ?Duration adult life ? ? ? ? ? ?Past Psychiatric History: depression, drug use ? ?Previous Psychotropic Medications: Yes  ? ?Substance Abuse History in the last 12 months:  No. ? ?Consequences of Substance Abuse: ?history of extensive drug use uptil 8 years ago and have been in rehab programs ? ?Past Medical History:  ?Past Medical History:  ?Diagnosis Date  ? Acute respiratory failure with hypoxia ([place])   ? Alcoholism and drug addiction in family   ? Anxiety   ? Appendicitis   ? Arthritis   ? "knees, hips, lower back" (09/25/2016)  ? Asthma   ? Bipolar disorder ([place])   ? Chronic bronchitis ([place])   ? "all the time" (09/25/2016)  ? Chronic leg pain   ? RLE  ? Chronic lower back pain   ? COPD (chronic obstructive pulmonary disease) ([place])   ? Depression   ? Diabetes mellitus without complication ([place])   ? Diabetic neuropathy ([place])   ? Early cataracts, bilateral   ? Emphysema of lung ([place])   ? Generalized anxiety disorder 02/09/2020  ? GERD (gastroesophageal reflux disease)   ? Headache   ? Hepatitis C   ? "treated back in 2001-2002"  ? High cholesterol   ? Hypertension   ? Hypothyroidism   ? Incarcerated ventral hernia 04/04/2017  ? Mood disorder in conditions classified elsewhere 02/09/2020  ? On home oxygen therapy   ? "2L; at nighttime" (04/04/2017)  ? OSA on CPAP   ?  CPAP with Oxygen 2 liters  ? Paranoid ([place])   ? Pneumonia   ? "all the time" (09/25/2016)  ? Positive PPD   ? with negative chest x ray  ? PTSD (post-traumatic stress disorder) 02/09/2020  ? Tachycardia   ? patient reports with exertion ; denies any other conditions   ? Thyroid disease   ? Wears glasses   ? Wears partial dentures   ?  ?Past Surgical History:  ?Procedure Laterality Date  ? BACK SURGERY    ? BIOPSY  11/24/2018  ? Procedure: BIOPSY;  Surgeon: Thornton Park, MD;  Location: [facility];  Service: Gastroenterology;;  ? CARDIAC  CATHETERIZATION    ? COLONOSCOPY    ? COLONOSCOPY WITH PROPOFOL N/A 11/24/2018  ? Procedure: COLONOSCOPY WITH PROPOFOL;  Surgeon: Thornton Park, MD;  Location: [facility];  Service: Gastroenterology;  Laterality: N/A;  ? CYST EXCISION    ? removal of cyst in sinuses  ? DILATION AND CURETTAGE OF UTERUS    ? ESOPHAGOGASTRODUODENOSCOPY (EGD) WITH PROPOFOL N/A 11/24/2018  ? Procedure: ESOPHAGOGASTRODUODENOSCOPY (EGD) WITH PROPOFOL;  Surgeon: Thornton Park, MD;  Location: [facility];  Service: Gastroenterology;  Laterality: N/A;  ? FOOT SURGERY Bilateral   ? bone removed from 4th and 5 th toes and put in pin then took pin out  ? HERNIA REPAIR    ? INCISION AND DRAINAGE  ~ 2014/2015  ? "removed mesh & infection"  ? INCISIONAL HERNIA REPAIR N/A 04/04/2017  ? Procedure: LAPAROSCOPIC INCISIONAL HERNIA REPAIR WITH MESH;  Surgeon: Fanny Skates, MD;  Location: [facility];  Service: General;  Laterality: N/A;  ? LACERATION REPAIR Right   ? repair wrist artery from laceration from ice skate  ? LAPAROSCOPIC APPENDECTOMY N/A 03/31/2019  ? Procedure: APPENDECTOMY LAPAROSCOPIC;  Surgeon: Ralene Ok, MD;  Location: [facility];  Service: General;  Laterality: N/A;  ? LAPAROSCOPIC APPENDECTOMY N/A 07/09/2019  ? Procedure: APPENDECTOMY LAPAROSCOPIC;  Surgeon: Ralene Ok, MD;  Location: [facility];  Service: General;  Laterality: N/A;  ? LAPAROSCOPIC INCISIONAL / UMBILICAL / [place]  04/04/2017  ? LAPAROSCOPIC INCISIONAL HERNIA REPAIR WITH MESH  ? LIPOMA EXCISION Right   ? fattty lipoma in neck  ? NASAL SEPTUM SURGERY    ? POSTERIOR LUMBAR FUSION  2015/2016 X 2  ? "added rods and screws"  ? SINOSCOPY    ? TOTAL HIP ARTHROPLASTY Right 02/05/2017  ? Procedure: RIGHT TOTAL HIP ARTHROPLASTY;  Surgeon: [name], MD;  Location: [facility];  Service: Orthopedics;  Laterality: Right;  ? TOTAL HIP ARTHROPLASTY Left 06/18/2017  ? Procedure: LEFT TOTAL HIP ARTHROPLASTY;  Surgeon: [name], MD;  Location: [facility];   Service: Orthopedics;  Laterality: Left;  ? TOTAL HIP REVISION Left 01/26/2019  ? Procedure: TOTAL HIP REVISION;  Surgeon: Paralee Cancel, MD;  Location: [facility];  Service: Orthopedics;  Laterality: Left;  2 hrs  ? TUBAL LIGATION    ? UMBILICAL HERNIA REPAIR  ~ 2013  ? w/mesh  ? UPPER GI ENDOSCOPY    ? ? ?Family Psychiatric History: FAther : alcohol use ? ?Family History:  ?Family History  ?Problem Relation Age of Onset  ? Diabetes Mother   ? Hypertension Mother   ? Hypertension Father   ? Cancer Father   ?     lung  ? ? ?Social History:   ?Social History  ? ?Socioeconomic History  ? Marital status: Widowed  ?  Spouse name: Not on file  ? Number of children: 3  ? Years  of education: Not on file  ? Highest education level: Not on file  ?Occupational History  ? Not on file  ?Tobacco Use  ? Smoking status: Every Day  ?  Packs/day: 0.50  ?  Years: 44.00  ?  Pack years: 22.00  ?  Types: Cigarettes  ? Smokeless tobacco: Never  ? Tobacco comments:  ?  down to 0.5ppd 06/30/20/jm  ?Vaping Use  ? Vaping Use: Never used  ?Substance and Sexual Activity  ? Alcohol use: Not Currently  ? Drug use: Not Currently  ?  Types: "Crack" cocaine  ?  Comment:  "nothing since 01/12/2012"  ? Sexual activity: Not Currently  ?  Partners: Male  ?Other Topics Concern  ? Not on file  ?Social History Narrative  ? Not on file  ? ?Social Determinants of Health  ? ?Financial Resource Strain: Not on file  ?Food Insecurity: No Food Insecurity  ? Worried About Charity fundraiser in the Last Year: Never true  ? Ran Out of Food in the Last Year: Never true  ?Transportation Needs: No Transportation Needs  ? Lack of Transportation (Medical): No  ? Lack of Transportation (Non-Medical): No  ?Physical Activity: Not on file  ?Stress: Stress Concern Present  ? Feeling of Stress : Very much  ?Social Connections: Not on file  ? ? ? ? ?Allergies:   ?Allergies  ?Allergen Reactions  ? Keflex [Cephalexin] Rash and Other (See Comments)  ?  Has tolerated amoxicillin since  had keflex  ? Shellfish Allergy Shortness Of Breath, Nausea And Vomiting and Other (See Comments)  ?  Stomach cramps  ? Tetracyclines & Related Anaphylaxis, Swelling and Other (See Comments)  ?  Throat swelling re

## 2021-06-11 ENCOUNTER — Telehealth: Payer: Self-pay | Admitting: Internal Medicine

## 2021-06-11 ENCOUNTER — Encounter: Payer: Self-pay | Admitting: Internal Medicine

## 2021-06-11 ENCOUNTER — Ambulatory Visit (INDEPENDENT_AMBULATORY_CARE_PROVIDER_SITE_OTHER): Payer: Medicare Other | Admitting: Internal Medicine

## 2021-06-11 VITALS — BP 130/68 | HR 78 | Resp 18 | Ht 68.0 in | Wt 207.6 lb

## 2021-06-11 DIAGNOSIS — E1169 Type 2 diabetes mellitus with other specified complication: Secondary | ICD-10-CM

## 2021-06-11 DIAGNOSIS — E038 Other specified hypothyroidism: Secondary | ICD-10-CM

## 2021-06-11 DIAGNOSIS — E785 Hyperlipidemia, unspecified: Secondary | ICD-10-CM

## 2021-06-11 DIAGNOSIS — J9611 Chronic respiratory failure with hypoxia: Secondary | ICD-10-CM | POA: Diagnosis not present

## 2021-06-11 DIAGNOSIS — I10 Essential (primary) hypertension: Secondary | ICD-10-CM

## 2021-06-11 DIAGNOSIS — E034 Atrophy of thyroid (acquired): Secondary | ICD-10-CM

## 2021-06-11 DIAGNOSIS — Z0001 Encounter for general adult medical examination with abnormal findings: Secondary | ICD-10-CM

## 2021-06-11 LAB — CBC
HCT: 36 % (ref 36.0–46.0)
Hemoglobin: 12 g/dL (ref 12.0–15.0)
MCHC: 33.2 g/dL (ref 30.0–36.0)
MCV: 89.6 fl (ref 78.0–100.0)
Platelets: 261 10*3/uL (ref 150.0–400.0)
RBC: 4.02 Mil/uL (ref 3.87–5.11)
RDW: 15.7 % — ABNORMAL HIGH (ref 11.5–15.5)
WBC: 8.5 10*3/uL (ref 4.0–10.5)

## 2021-06-11 LAB — LIPID PANEL
Cholesterol: 140 mg/dL (ref 0–200)
HDL: 51.1 mg/dL (ref >39.00)
LDL Cholesterol: 69 mg/dL (ref 0–99)
NonHDL: 88.84
Total CHOL/HDL Ratio: 3
Triglycerides: 98 mg/dL (ref 0.0–149.0)
VLDL: 19.6 mg/dL (ref 0.0–40.0)

## 2021-06-11 LAB — COMPREHENSIVE METABOLIC PANEL
ALT: 28 U/L (ref 0–35)
AST: 32 U/L (ref 0–37)
Albumin: 4.1 g/dL (ref 3.5–5.2)
Alkaline Phosphatase: 142 U/L — ABNORMAL HIGH (ref 39–117)
BUN: 9 mg/dL (ref 6–23)
CO2: 30 mEq/L (ref 19–32)
Calcium: 9.2 mg/dL (ref 8.4–10.5)
Chloride: 98 mEq/L (ref 96–112)
Creatinine, Ser: 1.04 mg/dL (ref 0.40–1.20)
GFR: 58.79 mL/min — ABNORMAL LOW (ref >60.00)
Glucose, Bld: 197 mg/dL — ABNORMAL HIGH (ref 70–99)
Potassium: 4 mEq/L (ref 3.5–5.1)
Sodium: 137 mEq/L (ref 135–145)
Total Bilirubin: 0.5 mg/dL (ref 0.2–1.2)
Total Protein: 7 g/dL (ref 6.0–8.3)

## 2021-06-11 LAB — TSH: TSH: 0.93 u[IU]/mL (ref 0.35–5.50)

## 2021-06-11 NOTE — Progress Notes (Signed)
? ?  Subjective:  ? ?Patient ID: Joan [name], female    DOB: [birth year], 61 y.o.   MRN: [id number] ? ?HPI ?The patient is here for physical. ? ?PMH, [facility], social history reviewed and updated ? ?Review of Systems  ?Constitutional:  Positive for activity change.  ?HENT: Negative.    ?Eyes: Negative.   ?Respiratory:  Positive for cough and shortness of breath. Negative for chest tightness.   ?Cardiovascular:  Negative for chest pain, palpitations and leg swelling.  ?Gastrointestinal:  Positive for abdominal pain. Negative for abdominal distention, constipation, diarrhea, nausea and vomiting.  ?Musculoskeletal:  Positive for arthralgias and myalgias.  ?Skin: Negative.   ?Neurological: Negative.   ?Psychiatric/Behavioral:  Positive for decreased concentration and dysphoric mood.   ? ?Objective:  ?Physical Exam ?Constitutional:   ?   Appearance: She is well-developed. She is obese.  ?HENT:  ?   Head: Normocephalic and atraumatic.  ?Cardiovascular:  ?   Rate and Rhythm: Normal rate and regular rhythm.  ?Pulmonary:  ?   Effort: Pulmonary effort is normal. No respiratory distress.  ?   Breath sounds: Wheezing and rhonchi present. No rales.  ?   Comments: Stable lung exam with chronic changes ?Abdominal:  ?   General: Bowel sounds are normal. There is no distension.  ?   Palpations: Abdomen is soft.  ?   Tenderness: There is no abdominal tenderness. There is no rebound.  ?Musculoskeletal:  ?   Cervical back: Normal range of motion.  ?Skin: ?   General: Skin is warm and dry.  ?Neurological:  ?   Mental Status: She is alert. Mental status is at baseline.  ?   Coordination: Coordination abnormal.  ? ? ?Vitals:  ? 06/11/21 1406  ?BP: 130/68  ?Pulse: 78  ?Resp: 18  ?SpO2: 92%  ?Weight: 207 lb 9.6 oz (94.2 kg)  ?Height: '5\' 8"'$  (1.727 m)  ? ? ?This visit occurred during the SARS-CoV-2 public health emergency.  Safety protocols were in place, including screening questions prior to the visit, additional usage of staff PPE, and  extensive cleaning of exam room while observing appropriate contact time as indicated for disinfecting solutions.  ? ?Assessment & Plan:  ? ?

## 2021-06-11 NOTE — Telephone Encounter (Signed)
PT had an appointment today with Dr.Crawford and would like to note that they need a prescription refilled. Prescription is for PT's Cyclobenzaprine Tab 10 mg. PT would like that prescription sent out to [facility]! ? ?CB: [phone] ?

## 2021-06-11 NOTE — Patient Instructions (Signed)
We will check the labs today. 
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
## 2021-06-12 ENCOUNTER — Ambulatory Visit (INDEPENDENT_AMBULATORY_CARE_PROVIDER_SITE_OTHER): Payer: Medicare Other | Admitting: Acute Care

## 2021-06-12 ENCOUNTER — Encounter: Payer: Self-pay | Admitting: Acute Care

## 2021-06-12 VITALS — BP 130/70 | HR 65 | Ht 68.0 in | Wt 204.4 lb

## 2021-06-12 DIAGNOSIS — J439 Emphysema, unspecified: Secondary | ICD-10-CM | POA: Diagnosis not present

## 2021-06-12 DIAGNOSIS — G4733 Obstructive sleep apnea (adult) (pediatric): Secondary | ICD-10-CM

## 2021-06-12 DIAGNOSIS — Z720 Tobacco use: Secondary | ICD-10-CM | POA: Diagnosis not present

## 2021-06-12 DIAGNOSIS — J449 Chronic obstructive pulmonary disease, unspecified: Secondary | ICD-10-CM

## 2021-06-12 DIAGNOSIS — Z9989 Dependence on other enabling machines and devices: Secondary | ICD-10-CM

## 2021-06-12 MED ORDER — CYCLOBENZAPRINE HCL 10 MG PO TABS: 10.0000 mg | ORAL_TABLET | Freq: Every day | ORAL | 1 refills | Status: DC

## 2021-06-12 NOTE — Patient Instructions (Addendum)
It is good to see you today ?Annual Screening scan due 04/2022 ?Stop smoking completely ?Call 1-800- QUIT NOW for free nicotine patches gum or mints ?Continue Breztri ?Rescue Albuterol nebs as needed / Combivent as needed ?Use your Spacer ?Continue Daliresp ?Note your daily symptoms > remember "red flags" for COPD:  ?Increase in cough, increase in sputum production, increase in shortness of breath or activity intolerance. ?If you notice these symptoms, please call to be seen.  ?Continue on CPAP at bedtime. You appear to be benefiting from the treatment  ?Goal is to wear for at least 6 hours each night for maximal clinical benefit. ?Continue to work on weight loss, as the link between excess weight  and sleep apnea is well established.   ?Remember to establish a good bedtime routine, and work on sleep hygiene.  ?Limit daytime naps , avoid stimulants such as caffeine and nicotine close to bedtime, exercise daily to promote sleep quality, avoid heavy , spicy, fried , or rich foods before bed. Ensure adequate exposure to natural light during the day,establish a relaxing bedtime routine with a pleasant sleep environment ( Bedroom between 60 and 67 degrees, turn off bright lights , TV or device screens screens , consider black out curtains or white noise machines) ?Do not drive if sleepy. ?Remember to clean mask, tubing, filter, and reservoir once weekly with soapy water.  ?Follow up with Dr. Elsworth Soho or Judson Roch NP   In 6 months or before as needed.    ? ?Please call and find out about the food banks in [place] who may be able to help you. ?Call to see if you qualify for Meals on Wheels  ?Please call [place] and see if they provide any free medication.  ?

## 2021-06-13 ENCOUNTER — Ambulatory Visit (INDEPENDENT_AMBULATORY_CARE_PROVIDER_SITE_OTHER): Payer: Medicare Other | Admitting: *Deleted

## 2021-06-13 DIAGNOSIS — J449 Chronic obstructive pulmonary disease, unspecified: Secondary | ICD-10-CM

## 2021-06-13 DIAGNOSIS — F32A Depression, unspecified: Secondary | ICD-10-CM | POA: Diagnosis not present

## 2021-06-13 DIAGNOSIS — J441 Chronic obstructive pulmonary disease with (acute) exacerbation: Secondary | ICD-10-CM | POA: Diagnosis not present

## 2021-06-13 DIAGNOSIS — I10 Essential (primary) hypertension: Secondary | ICD-10-CM | POA: Diagnosis not present

## 2021-06-13 DIAGNOSIS — E1140 Type 2 diabetes mellitus with diabetic neuropathy, unspecified: Secondary | ICD-10-CM | POA: Diagnosis not present

## 2021-06-13 DIAGNOSIS — J45909 Unspecified asthma, uncomplicated: Secondary | ICD-10-CM | POA: Diagnosis not present

## 2021-06-13 DIAGNOSIS — Z9989 Dependence on other enabling machines and devices: Secondary | ICD-10-CM | POA: Diagnosis not present

## 2021-06-13 DIAGNOSIS — G4733 Obstructive sleep apnea (adult) (pediatric): Secondary | ICD-10-CM | POA: Diagnosis not present

## 2021-06-13 DIAGNOSIS — F17200 Nicotine dependence, unspecified, uncomplicated: Secondary | ICD-10-CM | POA: Diagnosis not present

## 2021-06-13 NOTE — Assessment & Plan Note (Signed)
Checking lipid panel and adjust lipitor 20 mg daily daily for goal LDL <100.  ?

## 2021-06-13 NOTE — Assessment & Plan Note (Signed)
Checking CMP today and she is at goal on atenolol 25 mg daily and hctz 25 mg daily and will adjust as needed. ?

## 2021-06-13 NOTE — Assessment & Plan Note (Signed)
Recent Hga1c at goal and will continue her current blood glucose monitoring regimen. She understands that prednisone courses/steroid courses will cause her sugar levels to rise. Is on statin.  ?

## 2021-06-13 NOTE — Chronic Care Management (AMB) (Signed)
?Chronic Care Management  ? ?CCM RN Visit Note ? ?06/13/2021 ?Name: Joan [name] MRN: [id number] DOB: 1960-09-13 ? ?Subjective: ?Joan [name] is a 61 y.o. year old female who is a primary care patient of [name], MD. The care management team was consulted for assistance with disease management and care coordination needs.   ? ?Engaged with patient by telephone for follow up visit in response to provider referral for case management and/or care coordination services.  ? ?Consent to Services:  ?The patient was given information about Chronic Care Management services, agreed to services, and gave verbal consent prior to initiation of services.  Please see initial visit note for detailed documentation.  ?Patient agreed to services and verbal consent obtained.  ? ?Assessment: Review of patient past medical history, allergies, medications, health status, including review of consultants reports, laboratory and other test data, was performed as part of comprehensive evaluation and provision of chronic care management services.  ? ?SDOH (Social Determinants of Health) assessments and interventions performed:  ?SDOH Interventions   ? ?Flowsheet Row Most Recent Value  ?SDOH Interventions   ?Food Insecurity Interventions Intervention Not Indicated  [patient continues to deny food insecurity]  ?Housing Interventions Intervention Not Indicated  [patient continues driving self]  ?Transportation Interventions Intervention Not Indicated  [patient continues driving self]  ? ?  ?CCM Care Plan ? ?Allergies  ?Allergen Reactions  ? Keflex [Cephalexin] Rash and Other (See Comments)  ?  Has tolerated amoxicillin since had keflex  ? Shellfish Allergy Shortness Of Breath, Nausea And Vomiting and Other (See Comments)  ?  Stomach cramps  ? Tetracyclines & Related Anaphylaxis, Swelling and Other (See Comments)  ?  Throat swelling requiring hospitalization  ? Dilaudid [Hydromorphone Hcl] Other (See Comments)  ?   "Lethargy"  ? Prednisone Other (See Comments)  ?  "counter reacts", has tolerated IV medrol  ? Incruse Ellipta [Umeclidinium Bromide] Nausea Only  ? Other   ? Tuberculin Tests Other (See Comments)  ?  False postive  ? ?Outpatient Encounter Medications as of 06/13/2021  ?Medication Sig  ? Accu-Chek Softclix Lancets lancets Use as instructed  ? acyclovir (ZOVIRAX) 400 MG tablet TAKE 1 TABLET BY MOUTH  TWICE DAILY (Patient taking differently: 400 mg daily.)  ? albuterol (PROVENTIL) (2.5 MG/3ML) 0.083% nebulizer solution Take 3 mLs (2.5 mg total) by nebulization 2 (two) times daily. And every 6 hours as needed.  DX: J44.9  ? atenolol (TENORMIN) 25 MG tablet Take 25 mg by mouth daily.  ? atorvastatin (LIPITOR) 20 MG tablet TAKE 1 TABLET BY MOUTH  DAILY  ? blood glucose meter kit and supplies Dispense based on patient and insurance preference. Use up to two times daily as directed. (FOR ICD-10 E10.9, E11.9).  ? Budeson-Glycopyrrol-Formoterol (BREZTRI AEROSPHERE) 160-9-4.8 MCG/ACT AERO Inhale 2 puffs into the lungs in the morning and at bedtime.  ? buPROPion (WELLBUTRIN XL) 150 MG 24 hr tablet TAKE 1 TABLET BY MOUTH  DAILY  ? busPIRone (BUSPAR) 5 MG tablet TAKE 1/2 TO 1 TABLET BY MOUTH  TWICE DAILY AS NEEDED  ? Camphor-Menthol-Methyl Sal (SALONPAS) 3.03-02-08 % PTCH Apply 1 patch topically daily as needed.  ? cyclobenzaprine (FLEXERIL) 10 MG tablet Take 1 tablet (10 mg total) by mouth at bedtime.  ? glucose blood test strip Use as instructed  ? hydrochlorothiazide (HYDRODIURIL) 25 MG tablet TAKE 1 TABLET BY MOUTH  DAILY  ? Ipratropium-Albuterol (COMBIVENT RESPIMAT) 20-100 MCG/ACT AERS respimat Inhale 1 puff into the lungs every 6 (six) hours  as needed for wheezing or shortness of breath.  ? Ipratropium-Albuterol (COMBIVENT) 20-100 MCG/ACT AERS respimat Inhale 1 puff into the lungs every 6 (six) hours.  ? levothyroxine (SYNTHROID) 25 MCG tablet TAKE 1 TABLET BY MOUTH  DAILY BEFORE BREAKFAST  ? Liniments (SALONPAS ARTHRITIS  PAIN RELIEF EX) Apply 1 patch topically daily as needed (to painful sites- remove as directed).   ? methylPREDNISolone (MEDROL DOSEPAK) 4 MG TBPK tablet As per medrol dose pack instructions  ? montelukast (SINGULAIR) 10 MG tablet TAKE 1 TABLET BY MOUTH AT  BEDTIME  ? nystatin-triamcinolone ointment (MYCOLOG) Apply 1 application topically 2 (two) times daily as needed (applied to skin folds/groins/under breast skin irritation rash.).  ? OXYGEN Inhale 3 L/min into the lungs See admin instructions. Inhale  3 L/min of oxygen into the lungs w/CPAP at bedtime and as needed for shortness of breath with "strenuous activity" during the day  ? pantoprazole (PROTONIX) 40 MG tablet TAKE 1 TABLET BY MOUTH  DAILY  ? PATADAY 0.2 % SOLN Apply 1 drop to eye daily.  ? polyethylene glycol (MIRALAX / GLYCOLAX) 17 g packet Take 17 g by mouth daily as needed for mild constipation.  ? pregabalin (LYRICA) 150 MG capsule TAKE 1 CAPSULE BY MOUTH AT  BEDTIME  ? QUEtiapine (SEROQUEL) 50 MG tablet TAKE 1 TABLET BY MOUTH AT  BEDTIME  ? roflumilast (DALIRESP) 500 MCG TABS tablet Take 1 tablet (500 mcg total) by mouth daily.  ? traZODone (DESYREL) 100 MG tablet TAKE 1 TABLET BY MOUTH AT  BEDTIME  ? varenicline (CHANTIX) 1 MG tablet Take 1 mg by mouth 2 (two) times daily.  ? venlafaxine XR (EFFEXOR-XR) 150 MG 24 hr capsule TAKE 1 CAPSULE BY MOUTH  DAILY WITH BREAKFAST  ? witch hazel-glycerin (TUCKS) pad Apply topically as needed for itching.  ? ?No facility-administered encounter medications on file as of 06/13/2021.  ? ?Patient Active Problem List  ? Diagnosis Date Noted  ? Atypical chest pain 04/17/2021  ? Hypokalemia 04/03/2021  ? COPD (chronic obstructive pulmonary disease) with emphysema ([place]) 04/03/2021  ? COPD with acute exacerbation ([place]) 04/02/2021  ? Hypothyroidism   ? Diabetes mellitus, type II ([place])   ? Acute on chronic respiratory failure with hypoxia ([facility])   ? Generalized abdominal pain 03/28/2021  ? Depression 08/16/2020  ? PTSD  (post-traumatic stress disorder) 02/09/2020  ? Mood disorder in conditions classified elsewhere 02/09/2020  ? Generalized anxiety disorder 02/09/2020  ? Coarse tremors 10/28/2019  ? Chronic rhinitis 10/21/2019  ? Allergic rhinitis 06/16/2019  ? Cervical spondylosis without myelopathy 12/18/2018  ? Hypertension   ? Encounter for general adult medical examination with abnormal findings 11/06/2018  ? Smokers' cough ([place]) 01/15/2018  ? Pulmonary nodule 11/11/2017  ? Carotid artery disease ([place]) 11/05/2017  ? OSA on CPAP 08/07/2017  ? Gastroesophageal reflux disease 06/23/2017  ? Hypothyroidism due to acquired atrophy of thyroid 04/05/2017  ? Primary osteoarthritis involving multiple joints 04/05/2017  ? Chronic constipation 02/10/2017  ? Iron deficiency anemia due to dietary causes 02/10/2017  ? Osteoarthritis 02/10/2017  ? Hyperlipidemia associated with type 2 diabetes mellitus ([place]) 02/10/2017  ? Chronic respiratory failure with hypoxia ([place]) 12/16/2016  ? Essential hypertension 11/29/2016  ? Vocal cord dysfunction   ? Anxiety 09/24/2016  ? ?Conditions to be addressed/monitored:  HTN and COPD ? ?Care Plan : RN Care Manager Plan of Care  ?Updates made by Knox Royalty, RN since 06/13/2021 12:00 AM  ?  ? ?Problem: Chronic Disease Management Needs   ?  Priority: High  ?  ? ?Long-Range Goal: Ongoing adherence to established plan of care for long term chronic disease management   ?Start Date: 05/04/2021  ?Expected End Date: 05/05/2022  ?Priority: High  ?Note:   ?Current Barriers:  ?Chronic Disease Management support and education needs related to HTN and COPD ?Ongoing mental health needs: patient currently well-established with psychiatric/ counseling providers ?2 in-patient hospitalizations, within 30 days of one another, both for COPD exacerbation ?January 17-23, 2023: discharged to home with home health services in place through [facility] ?February 6-9, 2023: discharged to home/ self care with ongoing home health  services ?06/13/21: Confirms previously reported housing issues are resolved: currently renting a room in a single family home; reports she is thinking about moving to [place] to be closer to her daughter ?Recent efforts to

## 2021-06-13 NOTE — Assessment & Plan Note (Signed)
Checking TSH and adjust synthroid 25 mcg daily as needed.  ?

## 2021-06-13 NOTE — Patient Instructions (Addendum)
Visit Information ? ?Joan [name], thank you for taking time to talk with me today. Please don't hesitate to contact me if I can be of assistance to you before our next scheduled telephone appointment ? ?Below are the goals we discussed today:  ?Patient Self-Care Activities: ?Patient Joan [name] will: ?Take medications as prescribed ?Attend all scheduled provider appointments ?Call pharmacy for medication refills ?Call provider office for new concerns or questions ?Try to follow heart healthy, low salt, low cholesterol, carbohydrate-modified, low sugar diet as much as you are able ?Great job with not smoking:  You are doing GREAT! Keep up the very good work! ?Try to get some light activity on days the weather is nice  ?Follow rescue plan if symptoms of COPD/ breathing difficulty flare-up- if you have ongoing trouble with or worsening of your breathing, call your lung doctor right away ?Continue using your oxygen as prescribed ?Continue visiting regularly with your established counselor to keep your stress and anxiety/ depression under good control ?Keep up the great work not having any falls!  Continue using your walker as needed ? ?Our next scheduled telephone follow up visit/ appointment is scheduled on:   Monday, September 24, 2021 at 2:15 pm- This is a PHONE [place] appointment ? ?If you need to cancel or re-schedule our visit, please call [phone] and our care guide team will be happy to assist you. ?  ?I look forward to hearing about your progress. ?  ?Joan Rack, RN, BSN, CCRN Alumnus ?[place] ?([phone]: direct office ? ?If you are experiencing a Mental Health or [place] or need someone to talk to, please  ?call the Suicide and Crisis Lifeline: 988 ?call the [place] National Suicide Prevention Lifeline: 204-161-9884 or TTY: (870)228-5765 TTY (432)703-0988) to talk to a trained counselor ?call 1-800-273-TALK (toll free, 24 hour hotline) ?go to [facility] [address], [place] [phone]) ?call 911  ? ?Patient verbalizes understanding of instructions and care plan provided today and agrees to view in [place]. Active MyChart status confirmed with patient ? ?Health Maintenance, Female ?Adopting a healthy lifestyle and getting preventive care are important in promoting health and wellness. Ask your health care provider about: ?The right schedule for you to have regular tests and exams. ?Things you can do on your own to prevent diseases and keep yourself healthy. ?What should I know about diet, weight, and exercise? ?Eat a healthy diet ? ?Eat a diet that includes plenty of vegetables, fruits, low-fat dairy products, and lean protein. ?Do not eat a lot of foods that are high in solid fats, added sugars, or sodium. ?Maintain a healthy weight ?Body mass index (BMI) is used to identify weight problems. It estimates body fat based on height and weight. Your health care provider can help determine your BMI and help you achieve or maintain a healthy weight. ?Get regular exercise ?Get regular exercise. This is one of the most important things you can do for your health. Most adults should: ?Exercise for at least 150 minutes each week. The exercise should increase your heart rate and make you sweat (moderate-intensity exercise). ?Do strengthening exercises at least twice a week. This is in addition to the moderate-intensity exercise. ?Spend less time sitting. Even light physical activity can be beneficial. ?Watch cholesterol and blood lipids ?Have your blood tested for lipids and cholesterol at 60 years of age, then have this test every 5 years. ?Have your cholesterol levels checked more often  if: ?Your lipid or cholesterol levels are high. ?You are older than 60 years of age. ?You are at high risk for heart disease. ?What should I know about cancer screening? ?Depending on your health history and family history, you may need to have  cancer screening at various ages. This may include screening for: ?Breast cancer. ?Cervical cancer. ?Colorectal cancer. ?Skin cancer. ?Lung cancer. ?What should I know about heart disease, diabetes, and high blood pressure? ?Blood pressure and heart disease ?High blood pressure causes heart disease and increases the risk of stroke. This is more likely to develop in people who have high blood pressure readings or are overweight. ?Have your blood pressure checked: ?Every 3-5 years if you are 29-91 years of age. ?Every year if you are 12 years old or older. ?Diabetes ?Have regular diabetes screenings. This checks your fasting blood sugar level. Have the screening done: ?Once every three years after age 31 if you are at a normal weight and have a low risk for diabetes. ?More often and at a younger age if you are overweight or have a high risk for diabetes. ?What should I know about preventing infection? ?Hepatitis B ?If you have a higher risk for hepatitis B, you should be screened for this virus. Talk with your health care provider to find out if you are at risk for hepatitis B infection. ?Hepatitis C ?Testing is recommended for: ?Everyone born from [birth year] through 1964. ?Anyone with known risk factors for hepatitis C. ?Sexually transmitted infections (STIs) ?Get screened for STIs, including gonorrhea and chlamydia, if: ?You are sexually active and are younger than 60 years of age. ?You are older than 60 years of age and your health care provider tells you that you are at risk for this type of infection. ?Your sexual activity has changed since you were last screened, and you are at increased risk for chlamydia or gonorrhea. Ask your health care provider if you are at risk. ?Ask your health care provider about whether you are at high risk for HIV. Your health care provider may recommend a prescription medicine to help prevent HIV infection. If you choose to take medicine to prevent HIV, you should first get tested for HIV.  You should then be tested every 3 months for as long as you are taking the medicine. ?Pregnancy ?If you are about to stop having your period (premenopausal) and you may become pregnant, seek counseling before you get pregnant. ?Take 400 to 800 micrograms (mcg) of folic acid every day if you become pregnant. ?Ask for birth control (contraception) if you want to prevent pregnancy. ?Osteoporosis and menopause ?Osteoporosis is a disease in which the bones lose minerals and strength with aging. This can result in bone fractures. If you are 56 years old or older, or if you are at risk for osteoporosis and fractures, ask your health care provider if you should: ?Be screened for bone loss. ?Take a calcium or vitamin D supplement to lower your risk of fractures. ?Be given hormone replacement therapy (HRT) to treat symptoms of menopause. ?Follow these instructions at home: ?Alcohol use ?Do not drink alcohol if: ?Your health care provider tells you not to drink. ?You are pregnant, may be pregnant, or are planning to become pregnant. ?If you drink alcohol: ?Limit how much you have to: ?0-1 drink a day. ?Know how much alcohol is in your drink. In the [place] one drink equals one 12 oz bottle of beer (355 mL), one 5 oz glass of wine (148 mL), or  one 1? oz glass of hard liquor (44 mL). ?Lifestyle ?Do not use any products that contain nicotine or tobacco. These products include cigarettes, chewing tobacco, and vaping devices, such as e-cigarettes. If you need help quitting, ask your health care provider. ?Do not use street drugs. ?Do not share needles. ?Ask your health care provider for help if you need support or information about quitting drugs. ?General instructions ?Schedule regular health, dental, and eye exams. ?Stay current with your vaccines. ?Tell your health care provider if: ?You often feel depressed. ?You have ever been abused or do not feel safe at home. ?Summary ?Adopting a healthy lifestyle and getting preventive care  are important in promoting health and wellness. ?Follow your health care provider's instructions about healthy diet, exercising, and getting tested or screened for diseases. ?Follow your health care p

## 2021-06-13 NOTE — Assessment & Plan Note (Signed)
Flu shot yearly. Covid-19 counseled. Pneumonia due at 36. Shingrix counseled to get at pharmacy. Tetanus due 2030. Colonoscopy due 2030. Mammogram due declines, pap smear due declines. Counseled about sun safety and mole surveillance. Counseled about the dangers of distracted driving. Given 10 year screening recommendations.  ? ?

## 2021-06-13 NOTE — Assessment & Plan Note (Signed)
Letter done so that she can have mail delivery instead of walking to shared mail location.  ?

## 2021-06-14 ENCOUNTER — Ambulatory Visit (INDEPENDENT_AMBULATORY_CARE_PROVIDER_SITE_OTHER): Payer: Medicare Other | Admitting: Licensed Clinical Social Worker

## 2021-06-14 DIAGNOSIS — R443 Hallucinations, unspecified: Secondary | ICD-10-CM

## 2021-06-14 DIAGNOSIS — Z9989 Dependence on other enabling machines and devices: Secondary | ICD-10-CM | POA: Diagnosis not present

## 2021-06-14 DIAGNOSIS — J441 Chronic obstructive pulmonary disease with (acute) exacerbation: Secondary | ICD-10-CM | POA: Diagnosis not present

## 2021-06-14 DIAGNOSIS — F4310 Post-traumatic stress disorder, unspecified: Secondary | ICD-10-CM | POA: Diagnosis not present

## 2021-06-14 DIAGNOSIS — F17200 Nicotine dependence, unspecified, uncomplicated: Secondary | ICD-10-CM | POA: Diagnosis not present

## 2021-06-14 DIAGNOSIS — F411 Generalized anxiety disorder: Secondary | ICD-10-CM | POA: Diagnosis not present

## 2021-06-14 DIAGNOSIS — F0630 Mood disorder due to known physiological condition, unspecified: Secondary | ICD-10-CM

## 2021-06-14 DIAGNOSIS — F32A Depression, unspecified: Secondary | ICD-10-CM | POA: Diagnosis not present

## 2021-06-14 DIAGNOSIS — J45909 Unspecified asthma, uncomplicated: Secondary | ICD-10-CM | POA: Diagnosis not present

## 2021-06-14 DIAGNOSIS — G4733 Obstructive sleep apnea (adult) (pediatric): Secondary | ICD-10-CM | POA: Diagnosis not present

## 2021-06-14 DIAGNOSIS — E1140 Type 2 diabetes mellitus with diabetic neuropathy, unspecified: Secondary | ICD-10-CM | POA: Diagnosis not present

## 2021-06-14 DIAGNOSIS — I10 Essential (primary) hypertension: Secondary | ICD-10-CM | POA: Diagnosis not present

## 2021-06-14 NOTE — Progress Notes (Signed)
Virtual Visit via Telephone Note ? ?I connected with Joan [name] on 06/14/21 at  2:00 PM EDT by telephone and verified that I am speaking with the correct person using two identifiers. ? ?Location: ?Patient: home ?Provider: home office ?  ?I discussed the limitations, risks, security and privacy concerns of performing an evaluation and management service by telephone and the availability of in person appointments. I also discussed with the patient that there may be a patient responsible charge related to this service. The patient expressed understanding and agreed to proceed. ? ?I discussed the assessment and treatment plan with the patient. The patient was provided an opportunity to ask questions and all were answered. The patient agreed with the plan and demonstrated an understanding of the instructions. ?  ?The patient was advised to call back or seek an in-person evaluation if the symptoms worsen or if the condition fails to improve as anticipated. ? ?I provided 40 minutes of non-face-to-face time during this encounter. ? ?THERAPIST PROGRESS NOTE ? ?Session Time: 1:15 PM to 1:55 PM ? ?Participation Level: Active ? ?Behavioral Response: CasualAlertEuthymic ? ?Type of Therapy: Individual Therapy ? ?Treatment Goals addressed: PTSD, coping, wants to work on where she is right now and work through her childhood when things stabilize, she noted anxiety depression stress management with dealing with things right now ? ?ProgressTowards Goals: Progressing-patient has positive report of symptoms as well as processing feelings in session related to stressors ? ?Interventions: Solution Focused, Strength-based, and Supportive,coping ? ?Summary: Joan [name] is a 61 y.o. female who presents with doing good. Working on cleaning room and can't do what she used to do works on a corner of room has to build herself up. Feels bipolar. That is how she is do a little bit, bored, mind wanders to think about next corner  move to that corner then mind goes to paperwork place is mess trying to do everything at once. [place] good with Britta Mccreedy but Arleen if she had been there last week would have been upset left her with bugs in the house. In kitchen all over the counter and sprayed.  Patient talked about did financially impacting food in the house yesterday at lung doctor given numbers for food pantry. Still not smoking. February 20 when quit. Cravings can be bad. Prednisone this week hard time with breathing trying to get to bathroom and couldn't breath.  This pointed out one of the ways therapy she thinks is helpful for patient having connection patient says she does get lonely and therapist noted she is always urging patient to connect with support groups.  Niece found some things to do but also cost money.  But is noted this will be effective at some point patient finding a group she can connect to calls more lately. Thinking of going [place] needs to think about it.  ? ?Therapist noted major achievement of patient stopping smoking since February in terms of progress in treatment this is significant progress to address her health issues in such a positive way.  Noted patient struggling financially stressed terms of having enough food but helpful that she has resources for food pantries.  Did positive that niece is sending her information of places where she can meet people as this is a significant issue for patient even though does not have money therapist believes something will collect and she will have a connection with some group.  Therapist provided open questions supportive interventions active listening. ?Suicidal/Homicidal: No ? ?Plan: Return again  in 2 weeks.2..Continue to explore patient's feelings in session for insight that will help with coping, stress management and mood management ? ?Diagnosis: PTSD, hallucinations, GAD, Mood Disorder in conditions classified elsewhere ?Collaboration of Care: Other none  needed ? ?Patient/Guardian was advised Release of Information must be obtained prior to any record release in order to collaborate their care with an outside provider. Patient/Guardian was advised if they have not already done so to contact the registration department to sign all necessary forms in order for [place] to release information regarding their care.  ? ?Consent: Patient/Guardian gives verbal consent for treatment and assignment of benefits for services provided during this visit. Patient/Guardian expressed understanding and agreed to proceed.  ? ?[name], LCSW ?06/14/2021 ? ?

## 2021-06-14 NOTE — Progress Notes (Signed)
m °

## 2021-06-17 ENCOUNTER — Other Ambulatory Visit: Payer: Self-pay | Admitting: Internal Medicine

## 2021-06-19 ENCOUNTER — Telehealth: Payer: Medicare Other | Admitting: Student

## 2021-06-19 DIAGNOSIS — G4733 Obstructive sleep apnea (adult) (pediatric): Secondary | ICD-10-CM | POA: Diagnosis not present

## 2021-06-19 DIAGNOSIS — R52 Pain, unspecified: Secondary | ICD-10-CM

## 2021-06-19 DIAGNOSIS — J449 Chronic obstructive pulmonary disease, unspecified: Secondary | ICD-10-CM

## 2021-06-19 DIAGNOSIS — I10 Essential (primary) hypertension: Secondary | ICD-10-CM | POA: Diagnosis not present

## 2021-06-19 DIAGNOSIS — Z515 Encounter for palliative care: Secondary | ICD-10-CM | POA: Diagnosis not present

## 2021-06-19 DIAGNOSIS — J45909 Unspecified asthma, uncomplicated: Secondary | ICD-10-CM | POA: Diagnosis not present

## 2021-06-19 DIAGNOSIS — Z9989 Dependence on other enabling machines and devices: Secondary | ICD-10-CM | POA: Diagnosis not present

## 2021-06-19 DIAGNOSIS — R531 Weakness: Secondary | ICD-10-CM

## 2021-06-19 DIAGNOSIS — Z5986 Financial insecurity: Secondary | ICD-10-CM | POA: Diagnosis not present

## 2021-06-19 DIAGNOSIS — J441 Chronic obstructive pulmonary disease with (acute) exacerbation: Secondary | ICD-10-CM | POA: Diagnosis not present

## 2021-06-19 DIAGNOSIS — F17200 Nicotine dependence, unspecified, uncomplicated: Secondary | ICD-10-CM | POA: Diagnosis not present

## 2021-06-19 DIAGNOSIS — F32A Depression, unspecified: Secondary | ICD-10-CM | POA: Diagnosis not present

## 2021-06-19 DIAGNOSIS — E1140 Type 2 diabetes mellitus with diabetic neuropathy, unspecified: Secondary | ICD-10-CM | POA: Diagnosis not present

## 2021-06-20 DIAGNOSIS — Z9989 Dependence on other enabling machines and devices: Secondary | ICD-10-CM | POA: Diagnosis not present

## 2021-06-20 DIAGNOSIS — J45909 Unspecified asthma, uncomplicated: Secondary | ICD-10-CM | POA: Diagnosis not present

## 2021-06-20 DIAGNOSIS — F17200 Nicotine dependence, unspecified, uncomplicated: Secondary | ICD-10-CM | POA: Diagnosis not present

## 2021-06-20 DIAGNOSIS — E1140 Type 2 diabetes mellitus with diabetic neuropathy, unspecified: Secondary | ICD-10-CM | POA: Diagnosis not present

## 2021-06-20 DIAGNOSIS — F32A Depression, unspecified: Secondary | ICD-10-CM | POA: Diagnosis not present

## 2021-06-20 DIAGNOSIS — J441 Chronic obstructive pulmonary disease with (acute) exacerbation: Secondary | ICD-10-CM | POA: Diagnosis not present

## 2021-06-20 DIAGNOSIS — G4733 Obstructive sleep apnea (adult) (pediatric): Secondary | ICD-10-CM | POA: Diagnosis not present

## 2021-06-20 DIAGNOSIS — I10 Essential (primary) hypertension: Secondary | ICD-10-CM | POA: Diagnosis not present

## 2021-06-20 NOTE — Progress Notes (Signed)
? ? ?Manufacturing engineer ?Community Palliative Care Consult Note ?Telephone: [phone]  ?Fax: [phone]  ? ?Date of encounter: 06/19/2021 ?2:30pm ?PATIENT NAME: Joan [name] ?[place] [address]   ?[id number] (home)  ?DOB: [birth year] ?MRN: [id number] ?PRIMARY CARE PROVIDER:    ?Joan Koch, MD,  ?[place] [address] ?[id number] ? ?REFERRING PROVIDER:   ?Joan Koch, MD ?[place],  [place] [address] ?[id number] ? ?RESPONSIBLE PARTY:    ?Contact Information   ? ? Name Relation Home Work Mobile  ? Joan [name] Daughter [phone]  [phone]  ? Joan [name] Sister [phone]  [phone]  ? ?  ? ? ?Due to the COVID-19 crisis, this visit was [name] via telemedicine from my office and it was initiated and consent by this patient and or family. ? ?I connected with  Joan [name] OR PROXY on 06/19/2021 by a video enabled telemedicine application and verified that I am speaking with the correct person using two identifiers. ?  ?I discussed the limitations of evaluation and management by telemedicine. The patient expressed understanding and agreed to proceed.  ? ? ?                                 ASSESSMENT AND PLAN / RECOMMENDATIONS:  ? ?Advance Care Planning/Goals of Care: Goals include to maximize quality of life and symptom management. Patient/health care surrogate gave his/her permission to discuss.Our advance care planning conversation included a discussion about:    ?The value and importance of advance care planning  ?Experiences with loved ones who have been seriously ill or have died  ?Exploration of personal, cultural or spiritual beliefs that might influence medical decisions  ?Exploration of goals of care in the event of a sudden injury or illness  ?Identification  of a healthcare agent Daughter Joan [name] ?CODE STATUS: Full Code ? ?Education provided on palliative medicine versus hospice  services.  Discussed CODE STATUS.  Patient states that she would like to have CPR; discussed with this would look like for her. She is open to hospitalization. Patient states she would like to be kept alive long enough for for all of her children to visit.  Palliative medicine will continue to provide ongoing support, symptom management. ? ?Symptom Management/Plan: ? ?COPD-patient reports breathing being stable; endorses shortness of breath with exertion. Continue oxygen at 3 lpm continuously. Continue Breztri, albuterol nebulizer routinely and PRN as directed. ? ?Pain-patient endorses chronic neck, back and hip pain. Pain is currently managed. She had been seen by pain management in [place], previously consulted with surgeon. She feels her pain is currently managed. Continue naproxen, flexeril as directed. Patient does endorse worsening neuropathy. She is reaching out to PCP to see if her Lyrica can be increased.  ? ?Generalized weakness-continue PT/OT as directed. Use assistive device for ambulation. ? ?Financial concerns-patient does express some financial concerns with now having an increased amount in rent. She is interested in in home food program such as [employer]. She is also interested in any free programs for socialization. Will refer to palliative SW.  ? ?Follow up Palliative Care Visit: Palliative care will continue to follow for complex medical decision making, advance care planning, and clarification of goals. Return in 6-8 weeks or prn. ? ? ?This visit was coded based on medical decision making (MDM). ? ?PPS: 50% ? ?HOSPICE ELIGIBILITY/DIAGNOSIS: TBD ? ?Chief Complaint: Palliative Medicine initial consult.  ? ?  HISTORY OF PRESENT ILLNESS:  Joan [name] is a 61 y.o. year old female  with severe COPD, chronic respiratory failure with hypoxia, OSA on CPAP, CAD, aortic atherosclerosis, hypertension, hyperlipidemia, type 2 diabetes, hypothyroidism, depression, anxiety.  ? ?Patient resides at home.  She  currently has home health PT OT and skilled nurse services with [facility].  She does endorse chronic neck,  back and hip pain. She has been seen by pain management while living in [place]. States pain is currently a 3/10 today.  She endorses a fair appetite; checking blood sugars twice a day.  Blood sugars range from 135 to 200 mg/dL.  Patient does require some assistance with ADLs.  She states her breathing has been stable;endorses shortness of breath with exertion.  She is wearing oxygen at 3 L/min.  Patient does express needing assistance with cleaning; she is interested in programs such as Meals on Wheels or in-home meal program.  A 10 point review of systems is negative, except for the pertinent negatives and positives detailed in the HPI. ? ?History obtained from review of EMR, discussion with primary team, and interview with family, facility staff/caregiver and/or Joan [name].  ?I reviewed available labs, medications, imaging, studies and related documents from the EMR.  Records reviewed and summarized above.  ? ?ROS ? ?General: NAD ?EYES: denies vision changes ?ENMT: denies dysphagia ?Cardiovascular: denies chest pain, + DOE ?Pulmonary: cough ?Abdomen: endorses fair appetite, denies constipation, endorses continence of bowel ?GU: denies dysuria ?MSK: + increased weakness,  no recent falls reported ?Skin: denies rashes or wounds ?Neurological: denies insomnia ?Psych: Endorses stable mood ?Heme/lymph/immuno: denies bruises, abnormal bleeding ? ?Physical Exam: ?Constitutional: NAD ?General: frail appearing ?EYES: anicteric sclera, lids intact, no discharge  ?ENMT: intact hearing, ?CV: no LE edema ?Pulmonary: no increased work of breathing, no cough, oxygen via n/c ?Abdomen: no ascites ?GU: deferred ?MSK: moves all extremities, ambulatory ?Skin: warm and dry, no rashes or wounds on visible skin ?Neuro: +generalized weakness,  no cognitive impairment ?Psych: non-anxious affect, A and O x 3 ?Hem/lymph/immuno: no  widespread bruising ?CURRENT PROBLEM LIST:  ?Patient Active Problem List  ? Diagnosis Date Noted  ? Atypical chest pain 04/17/2021  ? Hypokalemia 04/03/2021  ? COPD (chronic obstructive pulmonary disease) with emphysema ([facility]) 04/03/2021  ? COPD with acute exacerbation ([place]) 04/02/2021  ? Hypothyroidism   ? Diabetes mellitus, type II ([place])   ? Acute on chronic respiratory failure with hypoxia ([facility])   ? Generalized abdominal pain 03/28/2021  ? Depression 08/16/2020  ? PTSD (post-traumatic stress disorder) 02/09/2020  ? Mood disorder in conditions classified elsewhere 02/09/2020  ? Generalized anxiety disorder 02/09/2020  ? Coarse tremors 10/28/2019  ? Chronic rhinitis 10/21/2019  ? Allergic rhinitis 06/16/2019  ? Cervical spondylosis without myelopathy 12/18/2018  ? Hypertension   ? Encounter for general adult medical examination with abnormal findings 11/06/2018  ? Smokers' cough ([place] [facility]) 01/15/2018  ? Pulmonary nodule 11/11/2017  ? Carotid artery disease ([facility]) 11/05/2017  ? OSA on CPAP 08/07/2017  ? Gastroesophageal reflux disease 06/23/2017  ? Hypothyroidism due to acquired atrophy of thyroid 04/05/2017  ? Primary osteoarthritis involving multiple joints 04/05/2017  ? Chronic constipation 02/10/2017  ? Iron deficiency anemia due to dietary causes 02/10/2017  ? Osteoarthritis 02/10/2017  ? Hyperlipidemia associated with type 2 diabetes mellitus ([place]) 02/10/2017  ? Chronic respiratory failure with hypoxia ([place]) 12/16/2016  ? Essential hypertension 11/29/2016  ? Vocal cord dysfunction   ? Anxiety 09/24/2016  ? ?PAST MEDICAL HISTORY:  ?Active Ambulatory  Problems  ?  Diagnosis Date Noted  ? Anxiety 09/24/2016  ? Vocal cord dysfunction   ? Essential hypertension 11/29/2016  ? Chronic respiratory failure with hypoxia ([facility]) 12/16/2016  ? Chronic constipation 02/10/2017  ? Iron deficiency anemia due to dietary causes 02/10/2017  ? Osteoarthritis 02/10/2017  ? Hyperlipidemia associated with type 2 diabetes mellitus ([place] [facility])  02/10/2017  ? Hypothyroidism due to acquired atrophy of thyroid 04/05/2017  ? Primary osteoarthritis involving multiple joints 04/05/2017  ? Gastroesophageal reflux disease 06/23/2017  ? OSA on CPAP 08/07/2017  ? Musician

## 2021-06-21 ENCOUNTER — Other Ambulatory Visit: Payer: Self-pay | Admitting: Internal Medicine

## 2021-06-21 DIAGNOSIS — Z1231 Encounter for screening mammogram for malignant neoplasm of breast: Secondary | ICD-10-CM

## 2021-06-22 ENCOUNTER — Telehealth: Payer: Self-pay

## 2021-06-22 ENCOUNTER — Other Ambulatory Visit: Payer: Self-pay

## 2021-06-22 DIAGNOSIS — G4733 Obstructive sleep apnea (adult) (pediatric): Secondary | ICD-10-CM | POA: Diagnosis not present

## 2021-06-22 DIAGNOSIS — E1140 Type 2 diabetes mellitus with diabetic neuropathy, unspecified: Secondary | ICD-10-CM | POA: Diagnosis not present

## 2021-06-22 DIAGNOSIS — J45909 Unspecified asthma, uncomplicated: Secondary | ICD-10-CM | POA: Diagnosis not present

## 2021-06-22 DIAGNOSIS — J441 Chronic obstructive pulmonary disease with (acute) exacerbation: Secondary | ICD-10-CM | POA: Diagnosis not present

## 2021-06-22 DIAGNOSIS — I10 Essential (primary) hypertension: Secondary | ICD-10-CM | POA: Diagnosis not present

## 2021-06-22 DIAGNOSIS — Z9989 Dependence on other enabling machines and devices: Secondary | ICD-10-CM | POA: Diagnosis not present

## 2021-06-22 DIAGNOSIS — F32A Depression, unspecified: Secondary | ICD-10-CM | POA: Diagnosis not present

## 2021-06-22 DIAGNOSIS — F17200 Nicotine dependence, unspecified, uncomplicated: Secondary | ICD-10-CM | POA: Diagnosis not present

## 2021-06-22 DIAGNOSIS — I350 Nonrheumatic aortic (valve) stenosis: Secondary | ICD-10-CM

## 2021-06-22 NOTE — Telephone Encounter (Signed)
PC SW outreached patient per [facility] NP - L.River's SW referral request to assess patient needs. ? ?Call unsuccessful. SW LVM with contact information.  ?

## 2021-06-24 DIAGNOSIS — J449 Chronic obstructive pulmonary disease, unspecified: Secondary | ICD-10-CM | POA: Diagnosis not present

## 2021-06-24 DIAGNOSIS — F17210 Nicotine dependence, cigarettes, uncomplicated: Secondary | ICD-10-CM | POA: Diagnosis not present

## 2021-06-24 DIAGNOSIS — I10 Essential (primary) hypertension: Secondary | ICD-10-CM | POA: Diagnosis not present

## 2021-06-27 DIAGNOSIS — J45909 Unspecified asthma, uncomplicated: Secondary | ICD-10-CM | POA: Diagnosis not present

## 2021-06-27 DIAGNOSIS — G4733 Obstructive sleep apnea (adult) (pediatric): Secondary | ICD-10-CM | POA: Diagnosis not present

## 2021-06-27 DIAGNOSIS — J441 Chronic obstructive pulmonary disease with (acute) exacerbation: Secondary | ICD-10-CM | POA: Diagnosis not present

## 2021-06-27 DIAGNOSIS — Z9989 Dependence on other enabling machines and devices: Secondary | ICD-10-CM | POA: Diagnosis not present

## 2021-06-27 DIAGNOSIS — E1140 Type 2 diabetes mellitus with diabetic neuropathy, unspecified: Secondary | ICD-10-CM | POA: Diagnosis not present

## 2021-06-27 DIAGNOSIS — I10 Essential (primary) hypertension: Secondary | ICD-10-CM | POA: Diagnosis not present

## 2021-06-27 DIAGNOSIS — F17200 Nicotine dependence, unspecified, uncomplicated: Secondary | ICD-10-CM | POA: Diagnosis not present

## 2021-06-27 DIAGNOSIS — F32A Depression, unspecified: Secondary | ICD-10-CM | POA: Diagnosis not present

## 2021-06-28 ENCOUNTER — Ambulatory Visit (INDEPENDENT_AMBULATORY_CARE_PROVIDER_SITE_OTHER): Payer: Medicare Other

## 2021-06-28 ENCOUNTER — Telehealth: Payer: Self-pay | Admitting: Internal Medicine

## 2021-06-28 ENCOUNTER — Ambulatory Visit (INDEPENDENT_AMBULATORY_CARE_PROVIDER_SITE_OTHER): Payer: Medicare Other | Admitting: Licensed Clinical Social Worker

## 2021-06-28 DIAGNOSIS — G4733 Obstructive sleep apnea (adult) (pediatric): Secondary | ICD-10-CM | POA: Diagnosis not present

## 2021-06-28 DIAGNOSIS — F17200 Nicotine dependence, unspecified, uncomplicated: Secondary | ICD-10-CM | POA: Diagnosis not present

## 2021-06-28 DIAGNOSIS — R443 Hallucinations, unspecified: Secondary | ICD-10-CM | POA: Diagnosis not present

## 2021-06-28 DIAGNOSIS — F4310 Post-traumatic stress disorder, unspecified: Secondary | ICD-10-CM | POA: Diagnosis not present

## 2021-06-28 DIAGNOSIS — J449 Chronic obstructive pulmonary disease, unspecified: Secondary | ICD-10-CM

## 2021-06-28 DIAGNOSIS — J441 Chronic obstructive pulmonary disease with (acute) exacerbation: Secondary | ICD-10-CM | POA: Diagnosis not present

## 2021-06-28 DIAGNOSIS — J45909 Unspecified asthma, uncomplicated: Secondary | ICD-10-CM | POA: Diagnosis not present

## 2021-06-28 DIAGNOSIS — F411 Generalized anxiety disorder: Secondary | ICD-10-CM | POA: Diagnosis not present

## 2021-06-28 DIAGNOSIS — I10 Essential (primary) hypertension: Secondary | ICD-10-CM

## 2021-06-28 DIAGNOSIS — E1140 Type 2 diabetes mellitus with diabetic neuropathy, unspecified: Secondary | ICD-10-CM | POA: Diagnosis not present

## 2021-06-28 DIAGNOSIS — F32A Depression, unspecified: Secondary | ICD-10-CM | POA: Diagnosis not present

## 2021-06-28 DIAGNOSIS — F0630 Mood disorder due to known physiological condition, unspecified: Secondary | ICD-10-CM

## 2021-06-28 DIAGNOSIS — Z9989 Dependence on other enabling machines and devices: Secondary | ICD-10-CM | POA: Diagnosis not present

## 2021-06-28 NOTE — Progress Notes (Signed)
?Chronic Care Management ?Pharmacy Note ? ?06/28/2021 ?Name:  Joan [name] MRN:  [id number] DOB:  11-02-1960 ? ?Summary: ?-Patient endorses compliance to current medications and inhalers  ?-Denies any issues or concerns regarding her medications at this time  ?-Reports that home nurse has been checking BP and it has been well controlled, could not recall recent readings  ?-Quit smoking Feb 20th - has been using chantix, wants to remain on at this time as it is helping aid in her smoking cessation  ? ?Recommendations/Changes made from today's visit: ?-Recommending no changes to medications, patient to reach out to office with any issues regarding her medications  ? ?Plan: ?-F/u in 4-6 months  ? ?Subjective: ?Joan [name] is an 61 y.o. year old female who is a primary patient of [name], MD.  The CCM team was consulted for assistance with disease management and care coordination needs.   ? ?Engaged with patient by telephone for follow up visit in response to provider referral for pharmacy case management and/or care coordination services.  ? ?Consent to Services:  ?The patient was given the following information about Chronic Care Management services today, agreed to services, and gave verbal consent: 1. CCM service includes personalized support from designated clinical staff supervised by the primary care provider, including individualized plan of care and coordination with other care providers 2. 24/7 contact phone numbers for assistance for urgent and routine care needs. 3. Service will only be billed when office clinical staff spend 20 minutes or more in a month to coordinate care. 4. Only one practitioner may furnish and bill the service in a calendar month. 5.The patient may stop CCM services at any time (effective at the end of the month) by phone call to the office staff. 6. The patient will be responsible for cost sharing (co-pay) of up to 20% of the service fee (after annual  deductible is met). Patient agreed to services and consent obtained. ? ?Patient Care Team: ?[name], MD as PCP - General (Internal Medicine) ?Lorretta Harp, MD as PCP - Cardiology (Cardiology) ?Thornton Park, MD as Consulting Physician (Gastroenterology) ?Gevena Cotton, MD as Consulting Physician (Ophthalmology) ?Paralee Cancel, MD as Consulting Physician (Orthopedic Surgery) ?Knox Royalty, RN as Case Manager ?[name], [facility] (Pharmacist) ? ?Recent office visits: ?06/11/2021 - Dr. Sharlet Salina - no changes to medications - f/u in 6 months  ?04/11/2021 - Dr. Sharlet Salina - no changes to medications  ?03/27/2021 - Dr. Sharlet Salina - referred to back to GI for abdominal pains - no changes to medications  ? ?Recent consult visits: ?06/19/2021 - Palliative Care - initial visit - establishing care  ?04/17/2021 - Dr. Gwenlyn Found - Cardiology - no changes to medications - f/u in 12 months  ? ?Hospital visits: ?04/02/2021-04/05/2021 - ED to hospital admission - COPD exacerbation  ?03/13/2021 - 03/19/2021 - ED to Hospital Admission - COPD exacerbation - declined SNF on discharge  ? ?Objective: ? ?Lab Results  ?Component Value Date  ? CREATININE 1.04 06/11/2021  ? BUN 9 06/11/2021  ? GFR 58.79 (L) 06/11/2021  ? GFRNONAA >60 04/05/2021  ? GFRAA 59 (L) 10/28/2019  ? NA 137 06/11/2021  ? K 4.0 06/11/2021  ? CALCIUM 9.2 06/11/2021  ? CO2 30 06/11/2021  ? GLUCOSE 197 (H) 06/11/2021  ? ? ?Lab Results  ?Component Value Date/Time  ? HGBA1C 6.8 (H) 03/14/2021 06:33 AM  ? HGBA1C 6.8 (H) 08/09/2020 05:37 AM  ? GFR 58.79 (L) 06/11/2021 03:05 PM  ? GFR  54.45 (L) 03/27/2021 03:01 PM  ? MICROALBUR 0.8 03/27/2021 03:01 PM  ? MICROALBUR <0.7 12/17/2018 02:02 PM  ?  ?Last diabetic Eye exam:  ?No results found for: HMDIABEYEEXA  ?Last diabetic Foot exam:  ?No results found for: HMDIABFOOTEX  ? ?Lab Results  ?Component Value Date  ? CHOL 140 06/11/2021  ? HDL 51.10 06/11/2021  ? [place] 69 06/11/2021  ? LDLDIRECT 56.0 12/17/2018  ? TRIG 98.0  06/11/2021  ? CHOLHDL 3 06/11/2021  ? ? ? ?  Latest Ref Rng & Units 06/11/2021  ?  3:05 PM 03/27/2021  ?  3:01 PM 03/13/2021  ? 11:44 PM  ?Hepatic Function  ?Total Protein 6.0 - 8.3 g/dL 7.0   6.5   6.6    ?Albumin 3.5 - 5.2 g/dL 4.1   3.8   3.5    ?AST 0 - 37 U/L 32   20   25    ?ALT 0 - 35 U/L 28   26   17     ?Alk Phosphatase 39 - 117 U/L 142   139   106    ?Total Bilirubin 0.2 - 1.2 mg/dL 0.5   0.4   0.8    ? ? ?Lab Results  ?Component Value Date/Time  ? TSH 0.93 06/11/2021 03:05 PM  ? TSH 0.89 10/28/2019 04:31 PM  ? FREET4 0.84 12/29/2017 08:47 AM  ? FREET4 0.72 09/27/2016 06:53 AM  ? ? ? ?  Latest Ref Rng & Units 06/11/2021  ?  3:05 PM 04/04/2021  ?  3:23 AM 04/02/2021  ?  5:56 PM  ?CBC  ?WBC 4.0 - 10.5 K/uL 8.5   15.4   9.0    ?Hemoglobin 12.0 - 15.0 g/dL 12.0   11.6   11.0    ?Hematocrit 36.0 - 46.0 % 36.0   36.1   34.1    ?Platelets 150.0 - 400.0 K/uL 261.0   279   273    ? ? ?No results found for: VD25OH ? ?Clinical ASCVD: No  ?The 10-year ASCVD risk score (Arnett DK, et al., 2019) is: 13% ?  Values used to calculate the score: ?    Age: 19 years ?    Sex: Female ?    Is Non-Hispanic African American: No ?    Diabetic: Yes ?    Tobacco smoker: Yes ?    Systolic Blood Pressure: 409 mmHg ?    Is BP treated: Yes ?    HDL Cholesterol: 51.1 mg/dL ?    Total Cholesterol: 140 mg/dL   ? ? ?  05/04/2021  ?  1:15 PM 03/27/2021  ?  2:34 PM 01/31/2021  ?  3:30 PM  ?Depression screen PHQ 2/9  ?Decreased Interest 0 0 0  ?Down, Depressed, Hopeless 1 0 0  ?PHQ - 2 Score 1 0 0  ?Altered sleeping  0   ?Tired, decreased energy  0   ?Change in appetite  0   ?Feeling bad or failure about yourself   0   ?Trouble concentrating  0   ?Moving slowly or fidgety/restless  0   ?Suicidal thoughts  0   ?PHQ-9 Score  0   ?Difficult doing work/chores  Not difficult at all   ?  ?Social History  ? ?Tobacco Use  ?Smoking Status Former  ? Packs/day: 0.50  ? Years: 44.00  ? Pack years: 22.00  ? Types: Cigarettes  ? Quit date: 04/07/2021  ? Years since  quitting: 0.2  ?Smokeless Tobacco Never  ?Tobacco  Comments  ? down to 0.5ppd 06/30/20/jm  ? ?BP Readings from Last 3 Encounters:  ?06/12/21 130/70  ?06/11/21 130/68  ?04/17/21 108/60  ? ?Pulse Readings from Last 3 Encounters:  ?06/12/21 65  ?06/11/21 78  ?04/17/21 69  ? ?Wt Readings from Last 3 Encounters:  ?06/12/21 204 lb 6.4 oz (92.7 kg)  ?06/11/21 207 lb 9.6 oz (94.2 kg)  ?04/17/21 200 lb (90.7 kg)  ? ?BMI Readings from Last 3 Encounters:  ?06/12/21 31.08 kg/m?  ?06/11/21 31.57 kg/m?  ?04/17/21 30.41 kg/m?  ? ? ?Assessment/Interventions: Review of patient past medical history, allergies, medications, health status, including review of consultants reports, laboratory and other test data, was performed as part of comprehensive evaluation and provision of chronic care management services.  ? ?SDOH:  (Social Determinants of Health) assessments and interventions performed: Yes ? ?SDOH Screenings  ? ?Alcohol Screen: Not on file  ?Depression (PHQ2-9): Low Risk   ? PHQ-2 Score: 1  ?Financial Resource Strain: Not on file  ?Food Insecurity: No Food Insecurity  ? Worried About Charity fundraiser in the Last Year: Never true  ? Ran Out of Food in the Last Year: Never true  ?Housing: Low Risk   ? Last Housing Risk Score: 0  ?Physical Activity: Not on file  ?Social Connections: Not on file  ?Stress: Stress Concern Present  ? Feeling of Stress : Very much  ?Tobacco Use: Medium Risk  ? Smoking Tobacco Use: Former  ? Smokeless Tobacco Use: Never  ? Passive Exposure: Not on file  ?Transportation Needs: No Transportation Needs  ? Lack of Transportation (Medical): No  ? Lack of Transportation (Non-Medical): No  ? ? ?CCM Care Plan ? ?Allergies  ?Allergen Reactions  ? Keflex [Cephalexin] Rash and Other (See Comments)  ?  Has tolerated amoxicillin since had keflex  ? Shellfish Allergy Shortness Of Breath, Nausea And Vomiting and Other (See Comments)  ?  Stomach cramps  ? Tetracyclines & Related Anaphylaxis, Swelling and Other (See  Comments)  ?  Throat swelling requiring hospitalization  ? Dilaudid [Hydromorphone Hcl] Other (See Comments)  ?  "Lethargy"  ? Prednisone Other (See Comments)  ?  "counter reacts", has tolerated IV medrol  ? Incruse E

## 2021-06-28 NOTE — Telephone Encounter (Signed)
Called to report that pt fell on Tuesday.  ? ?No injuries, and was able to get herself back up.  ?
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
## 2021-06-28 NOTE — Patient Instructions (Signed)
Visit Information ? ?Following are the goals we discussed today:  ? ?Manage My Medicine  ? ?Timeframe:  Long-Range Goal ?Priority:  Medium ?Start Date:   06/28/2021                          ?Expected End Date:  06/29/2022                    ? ?Follow Up Date 10/2021 ?  ?- call for medicine refill 2 or 3 days before it runs out ?- call if I am sick and can't take my medicine ?- keep a list of all the medicines I take; vitamins and herbals too ?- learn to read medicine labels  ?  ?Why is this important?   ?These steps will help you keep on track with your medicines. ?  ? ?Plan: Telephone follow up appointment with care management team member scheduled for:  6 months ?The patient has been provided with contact information for the care management team and has been advised to call with any health related questions or concerns.  ? ?[name], PharmD ?Clinical Pharmacist, [place]  ? ?Please call the care guide team at [phone] if you need to cancel or reschedule your appointment.  ? ?Patient verbalizes understanding of instructions and care plan provided today and agrees to view in [facility]. Active MyChart status confirmed with patient.   ? ?

## 2021-06-29 ENCOUNTER — Encounter: Payer: Medicare Other | Admitting: Vascular Surgery

## 2021-06-29 ENCOUNTER — Encounter (HOSPITAL_COMMUNITY): Payer: Medicare Other

## 2021-07-04 DIAGNOSIS — J441 Chronic obstructive pulmonary disease with (acute) exacerbation: Secondary | ICD-10-CM | POA: Diagnosis not present

## 2021-07-04 DIAGNOSIS — G4733 Obstructive sleep apnea (adult) (pediatric): Secondary | ICD-10-CM | POA: Diagnosis not present

## 2021-07-04 DIAGNOSIS — J449 Chronic obstructive pulmonary disease, unspecified: Secondary | ICD-10-CM | POA: Diagnosis not present

## 2021-07-05 ENCOUNTER — Telehealth: Payer: Self-pay | Admitting: Gastroenterology

## 2021-07-05 DIAGNOSIS — F17200 Nicotine dependence, unspecified, uncomplicated: Secondary | ICD-10-CM | POA: Diagnosis not present

## 2021-07-05 DIAGNOSIS — J441 Chronic obstructive pulmonary disease with (acute) exacerbation: Secondary | ICD-10-CM | POA: Diagnosis not present

## 2021-07-05 DIAGNOSIS — G4733 Obstructive sleep apnea (adult) (pediatric): Secondary | ICD-10-CM | POA: Diagnosis not present

## 2021-07-05 DIAGNOSIS — Z9989 Dependence on other enabling machines and devices: Secondary | ICD-10-CM | POA: Diagnosis not present

## 2021-07-05 DIAGNOSIS — J45909 Unspecified asthma, uncomplicated: Secondary | ICD-10-CM | POA: Diagnosis not present

## 2021-07-05 DIAGNOSIS — F32A Depression, unspecified: Secondary | ICD-10-CM | POA: Diagnosis not present

## 2021-07-05 DIAGNOSIS — E1140 Type 2 diabetes mellitus with diabetic neuropathy, unspecified: Secondary | ICD-10-CM | POA: Diagnosis not present

## 2021-07-05 DIAGNOSIS — I10 Essential (primary) hypertension: Secondary | ICD-10-CM | POA: Diagnosis not present

## 2021-07-05 NOTE — Progress Notes (Signed)
?Office Note  ? ? ? ?CC:  aortic atherosclerosis ?Requesting Provider:  [name], * ? ?HPI: Joan [name] is a pleasant 61 y.o. (01-25-1961) female presenting at the request of .[name], MD recent CT scan demonstrated atherosclerotic disease throughout the aortoiliac system. ? ?On exam, Joan [name] was doing well.  She stated she asked for this referral after reading her CT report noting the atherosclerotic disease.  She denies symptoms of claudication, ischemic rest pain, tissue loss.  She has no postprandial pain, no food fear, no recent weight loss. ? 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  Sensation today, Joan [name] was noted to have supplemental oxygen as well as intermittent need for a wheelchair.   ? ?The pt is  on a statin for cholesterol management.  ? ? ?Past Medical History:  ?Diagnosis Date  ? Acute respiratory failure with hypoxia ([place])   ? Alcoholism and drug addiction in family   ? Anxiety   ? Appendicitis   ? Arthritis   ? "knees, hips, lower back" (09/25/2016)  ? Asthma   ? Bipolar disorder ([place])   ? Chronic bronchitis ([place])   ? "all the time" (09/25/2016)  ? Chronic leg pain   ? RLE  ? Chronic lower back pain   ? COPD (chronic obstructive pulmonary disease) ([place])   ? Depression   ? Diabetes mellitus without complication ([place])   ? Diabetic neuropathy ([place])   ? Early cataracts, bilateral   ? Emphysema of lung ([place])   ? Generalized anxiety disorder 02/09/2020  ? GERD (gastroesophageal reflux disease)   ? Headache   ? Hepatitis C   ? "treated back in 2001-2002"  ? High cholesterol   ? Hypertension   ? Hypothyroidism   ? Incarcerated ventral hernia 04/04/2017  ? Mood disorder in conditions classified elsewhere 02/09/2020  ? On home oxygen therapy   ? "2L; at nighttime" (04/04/2017)  ? OSA on CPAP   ? CPAP with Oxygen 2 liters  ? Paranoid ([place])   ? Pneumonia   ? "all the time" (09/25/2016)  ? Positive PPD   ? with negative chest x ray   ? PTSD (post-traumatic stress disorder) 02/09/2020  ? Tachycardia   ? patient reports with exertion ; denies any other conditions   ? Thyroid disease   ? Wears glasses   ? Wears partial dentures   ? ? ?Past Surgical History:  ?Procedure Laterality Date  ? BACK SURGERY    ? BIOPSY  11/24/2018  ? Procedure: BIOPSY;  Surgeon: Thornton Park, MD;  Location: [facility];  Service: Gastroenterology;;  ? CARDIAC CATHETERIZATION    ? COLONOSCOPY    ? COLONOSCOPY WITH PROPOFOL N/A 11/24/2018  ? Procedure: COLONOSCOPY WITH PROPOFOL;  Surgeon: Thornton Park, MD;  Location: [facility];  Service: Gastroenterology;  Laterality: N/A;  ? CYST EXCISION    ? removal of cyst in sinuses  ? DILATION AND CURETTAGE OF UTERUS    ? ESOPHAGOGASTRODUODENOSCOPY (EGD) WITH PROPOFOL N/A 11/24/2018  ? Procedure: ESOPHAGOGASTRODUODENOSCOPY (EGD) WITH PROPOFOL;  Surgeon: Thornton Park, MD;  Location: [facility];  Service: Gastroenterology;  Laterality: N/A;  ? FOOT SURGERY Bilateral   ? bone removed from 4th and 5 th toes and put in pin then took pin out  ? HERNIA REPAIR    ? INCISION AND DRAINAGE  ~ 2014/2015  ? "removed mesh & infection"  ? INCISIONAL HERNIA REPAIR N/A 04/04/2017  ? Procedure: LAPAROSCOPIC  INCISIONAL HERNIA REPAIR WITH MESH;  Surgeon: Fanny Skates, MD;  Location: [facility];  Service: General;  Laterality: N/A;  ? LACERATION REPAIR Right   ? repair wrist artery from laceration from ice skate  ? LAPAROSCOPIC APPENDECTOMY N/A 03/31/2019  ? Procedure: APPENDECTOMY LAPAROSCOPIC;  Surgeon: Ralene Ok, MD;  Location: [facility];  Service: General;  Laterality: N/A;  ? LAPAROSCOPIC APPENDECTOMY N/A 07/09/2019  ? Procedure: APPENDECTOMY LAPAROSCOPIC;  Surgeon: Ralene Ok, MD;  Location: [facility];  Service: General;  Laterality: N/A;  ? LAPAROSCOPIC INCISIONAL / UMBILICAL / [place]  04/04/2017  ? LAPAROSCOPIC INCISIONAL HERNIA REPAIR WITH MESH  ? LIPOMA EXCISION Right   ? fattty lipoma in neck  ? NASAL SEPTUM  SURGERY    ? POSTERIOR LUMBAR FUSION  2015/2016 X 2  ? "added rods and screws"  ? SINOSCOPY    ? TOTAL HIP ARTHROPLASTY Right 02/05/2017  ? Procedure: RIGHT TOTAL HIP ARTHROPLASTY;  Surgeon: [name], MD;  Location: [facility];  Service: Orthopedics;  Laterality: Right;  ? TOTAL HIP ARTHROPLASTY Left 06/18/2017  ? Procedure: LEFT TOTAL HIP ARTHROPLASTY;  Surgeon: [name], MD;  Location: [facility];  Service: Orthopedics;  Laterality: Left;  ? TOTAL HIP REVISION Left 01/26/2019  ? Procedure: TOTAL HIP REVISION;  Surgeon: Paralee Cancel, MD;  Location: [facility];  Service: Orthopedics;  Laterality: Left;  2 hrs  ? TUBAL LIGATION    ? UMBILICAL HERNIA REPAIR  ~ 2013  ? w/mesh  ? UPPER GI ENDOSCOPY    ? ? ?Social History  ? ?Socioeconomic History  ? Marital status: Widowed  ?  Spouse name: Not on file  ? Number of children: 3  ? Years of education: Not on file  ? Highest education level: Not on file  ?Occupational History  ? Not on file  ?Tobacco Use  ? Smoking status: Former  ?  Packs/day: 0.50  ?  Years: 44.00  ?  Pack years: 22.00  ?  Types: Cigarettes  ?  Quit date: 04/07/2021  ?  Years since quitting: 0.2  ? Smokeless tobacco: Never  ? Tobacco comments:  ?  down to 0.5ppd 06/30/20/jm  ?Vaping Use  ? Vaping Use: Never used  ?Substance and Sexual Activity  ? Alcohol use: Not Currently  ? Drug use: Not Currently  ?  Types: "Crack" cocaine  ?  Comment:  "nothing since 01/12/2012"  ? Sexual activity: Not Currently  ?  Partners: Male  ?Other Topics Concern  ? Not on file  ?Social History Narrative  ? Not on file  ? ?Social Determinants of Health  ? ?Financial Resource Strain: Not on file  ?Food Insecurity: No Food Insecurity  ? Worried About Charity fundraiser in the Last Year: Never true  ? Ran Out of Food in the Last Year: Never true  ?Transportation Needs: No Transportation Needs  ? Lack of Transportation (Medical): No  ? Lack of Transportation (Non-Medical): No  ?Physical Activity: Not on file  ?Stress: Stress Concern  Present  ? Feeling of Stress : Very much  ?Social Connections: Not on file  ?Intimate Partner Violence: Not on file  ? ?Family History  ?Problem Relation Age of Onset  ? Diabetes Mother   ? Hypertension Mother   ? Hypertension Father   ? Cancer Father   ?     lung  ? ? ?Current Outpatient Medications  ?Medication Sig Dispense Refill  ? Accu-Chek Softclix Lancets lancets Use as instructed 100 each 12  ?  acyclovir (ZOVIRAX) 400 MG tablet TAKE 1 TABLET BY MOUTH  TWICE DAILY 180 tablet 3  ? albuterol (PROVENTIL) (2.5 MG/3ML) 0.083% nebulizer solution Take 3 mLs (2.5 mg total) by nebulization 2 (two) times daily. And every 6 hours as needed.  DX: J44.9 360 mL 3  ? atenolol (TENORMIN) 25 MG tablet Take 25 mg by mouth daily.    ? atorvastatin (LIPITOR) 20 MG tablet TAKE 1 TABLET BY MOUTH  DAILY 90 tablet 3  ? blood glucose meter kit and supplies Dispense based on patient and insurance preference. Use up to two times daily as directed. (FOR ICD-10 E10.9, E11.9). 1 each 0  ? Budeson-Glycopyrrol-Formoterol (BREZTRI AEROSPHERE) 160-9-4.8 MCG/ACT AERO Inhale 2 puffs into the lungs in the morning and at bedtime. 10.7 g 0  ? buPROPion (WELLBUTRIN XL) 150 MG 24 hr tablet TAKE 1 TABLET BY MOUTH  DAILY 30 tablet 11  ? busPIRone (BUSPAR) 5 MG tablet TAKE 1/2 TO 1 TABLET BY MOUTH  TWICE DAILY AS NEEDED 180 tablet 0  ? Camphor-Menthol-Methyl Sal (SALONPAS) 3.03-02-08 % PTCH Apply 1 patch topically daily as needed.    ? cyclobenzaprine (FLEXERIL) 10 MG tablet Take 1 tablet (10 mg total) by mouth at bedtime. 90 tablet 1  ? glucose blood test strip Use as instructed 100 each 12  ? hydrochlorothiazide (HYDRODIURIL) 25 MG tablet TAKE 1 TABLET BY MOUTH  DAILY 90 tablet 3  ? Ipratropium-Albuterol (COMBIVENT RESPIMAT) 20-100 MCG/ACT AERS respimat Inhale 1 puff into the lungs every 6 (six) hours as needed for wheezing or shortness of breath. 4 g 11  ? levothyroxine (SYNTHROID) 25 MCG tablet TAKE 1 TABLET BY MOUTH  DAILY BEFORE BREAKFAST 90 tablet  3  ? Liniments (SALONPAS ARTHRITIS PAIN RELIEF EX) Apply 1 patch topically daily as needed (to painful sites- remove as directed).     ? montelukast (SINGULAIR) 10 MG tablet TAKE 1 TABLET BY MOUTH AT

## 2021-07-05 NOTE — Telephone Encounter (Signed)
Patient returned call from a referral we received for abdominal pain she stated she has two upcoming appts to follow up with a vascular provider for 07/06/21. She also would like to let Dr. Tarri Glenn know that she was at the ED in February please advise. ?

## 2021-07-06 ENCOUNTER — Encounter: Payer: Self-pay | Admitting: Vascular Surgery

## 2021-07-06 ENCOUNTER — Ambulatory Visit: Payer: Medicare Other | Admitting: Vascular Surgery

## 2021-07-06 ENCOUNTER — Ambulatory Visit (HOSPITAL_COMMUNITY)
Admission: RE | Admit: 2021-07-06 | Discharge: 2021-07-06 | Disposition: A | Discharge status: Home / Self Care | Payer: Medicare Other | Source: Ambulatory Visit | Attending: Vascular Surgery | Admitting: Vascular Surgery

## 2021-07-06 VITALS — BP 127/73 | HR 71 | Temp 98.1°F | Resp 20 | Ht 68.0 in

## 2021-07-06 DIAGNOSIS — R0989 Other specified symptoms and signs involving the circulatory and respiratory systems: Secondary | ICD-10-CM | POA: Diagnosis not present

## 2021-07-06 DIAGNOSIS — I739 Peripheral vascular disease, unspecified: Secondary | ICD-10-CM

## 2021-07-06 DIAGNOSIS — I350 Nonrheumatic aortic (valve) stenosis: Secondary | ICD-10-CM | POA: Insufficient documentation

## 2021-07-11 DIAGNOSIS — G4733 Obstructive sleep apnea (adult) (pediatric): Secondary | ICD-10-CM | POA: Diagnosis not present

## 2021-07-11 DIAGNOSIS — Z9989 Dependence on other enabling machines and devices: Secondary | ICD-10-CM | POA: Diagnosis not present

## 2021-07-11 DIAGNOSIS — E1140 Type 2 diabetes mellitus with diabetic neuropathy, unspecified: Secondary | ICD-10-CM | POA: Diagnosis not present

## 2021-07-11 DIAGNOSIS — F32A Depression, unspecified: Secondary | ICD-10-CM | POA: Diagnosis not present

## 2021-07-11 DIAGNOSIS — I10 Essential (primary) hypertension: Secondary | ICD-10-CM | POA: Diagnosis not present

## 2021-07-11 DIAGNOSIS — J45909 Unspecified asthma, uncomplicated: Secondary | ICD-10-CM | POA: Diagnosis not present

## 2021-07-11 DIAGNOSIS — F17200 Nicotine dependence, unspecified, uncomplicated: Secondary | ICD-10-CM | POA: Diagnosis not present

## 2021-07-11 DIAGNOSIS — J441 Chronic obstructive pulmonary disease with (acute) exacerbation: Secondary | ICD-10-CM | POA: Diagnosis not present

## 2021-07-12 ENCOUNTER — Ambulatory Visit (HOSPITAL_COMMUNITY): Payer: Medicare Other | Admitting: Licensed Clinical Social Worker

## 2021-07-16 ENCOUNTER — Encounter: Payer: Self-pay | Admitting: Internal Medicine

## 2021-07-24 ENCOUNTER — Ambulatory Visit
Admission: RE | Admit: 2021-07-24 | Discharge: 2021-07-24 | Disposition: A | Discharge status: Home / Self Care | Payer: Medicare Other | Source: Ambulatory Visit | Attending: Internal Medicine | Admitting: Internal Medicine

## 2021-07-24 DIAGNOSIS — Z1231 Encounter for screening mammogram for malignant neoplasm of breast: Secondary | ICD-10-CM | POA: Insufficient documentation

## 2021-07-25 DIAGNOSIS — J449 Chronic obstructive pulmonary disease, unspecified: Secondary | ICD-10-CM

## 2021-07-25 DIAGNOSIS — F17210 Nicotine dependence, cigarettes, uncomplicated: Secondary | ICD-10-CM | POA: Diagnosis not present

## 2021-07-25 DIAGNOSIS — E785 Hyperlipidemia, unspecified: Secondary | ICD-10-CM | POA: Diagnosis not present

## 2021-07-25 DIAGNOSIS — E1169 Type 2 diabetes mellitus with other specified complication: Secondary | ICD-10-CM | POA: Diagnosis not present

## 2021-07-25 DIAGNOSIS — I10 Essential (primary) hypertension: Secondary | ICD-10-CM

## 2021-07-27 ENCOUNTER — Telehealth: Payer: Self-pay

## 2021-07-27 NOTE — Progress Notes (Signed)
Chronic Care Management Pharmacy Assistant   Name: Joan [name]  MRN: [id number] DOB: 03-16-1960   Reason for Encounter: Disease State   Conditions to be addressed/monitored: COPD   Recent office visits:  None since last coordination call 06/28/21  Recent consult visits:  07/06/21 Broadus [name], MD-Vascular Surgery ([facility]) No orders or medication changes  Hospital visits:  None since last coordination call 06/28/21   Medications: Outpatient Encounter Medications as of 07/27/2021  Medication Sig   Accu-Chek Softclix Lancets lancets Use as instructed   acyclovir (ZOVIRAX) 400 MG tablet TAKE 1 TABLET BY MOUTH  TWICE DAILY   albuterol (PROVENTIL) (2.5 MG/3ML) 0.083% nebulizer solution Take 3 mLs (2.5 mg total) by nebulization 2 (two) times daily. And every 6 hours as needed.  DX: J44.9   atenolol (TENORMIN) 25 MG tablet Take 25 mg by mouth daily.   atorvastatin (LIPITOR) 20 MG tablet TAKE 1 TABLET BY MOUTH  DAILY   blood glucose meter kit and supplies Dispense based on patient and insurance preference. Use up to two times daily as directed. (FOR ICD-10 E10.9, E11.9).   Budeson-Glycopyrrol-Formoterol (BREZTRI AEROSPHERE) 160-9-4.8 MCG/ACT AERO Inhale 2 puffs into the lungs in the morning and at bedtime.   buPROPion (WELLBUTRIN XL) 150 MG 24 hr tablet TAKE 1 TABLET BY MOUTH  DAILY   busPIRone (BUSPAR) 5 MG tablet TAKE 1/2 TO 1 TABLET BY MOUTH  TWICE DAILY AS NEEDED   Camphor-Menthol-Methyl Sal (SALONPAS) 3.03-02-08 % PTCH Apply 1 patch topically daily as needed.   cyclobenzaprine (FLEXERIL) 10 MG tablet Take 1 tablet (10 mg total) by mouth at bedtime.   glucose blood test strip Use as instructed   hydrochlorothiazide (HYDRODIURIL) 25 MG tablet TAKE 1 TABLET BY MOUTH  DAILY   Ipratropium-Albuterol (COMBIVENT RESPIMAT) 20-100 MCG/ACT AERS respimat Inhale 1 puff into the lungs every 6 (six) hours as needed for wheezing or shortness of breath.   levothyroxine (SYNTHROID) 25 MCG tablet  TAKE 1 TABLET BY MOUTH  DAILY BEFORE BREAKFAST   Liniments (SALONPAS ARTHRITIS PAIN RELIEF EX) Apply 1 patch topically daily as needed (to painful sites- remove as directed).    montelukast (SINGULAIR) 10 MG tablet TAKE 1 TABLET BY MOUTH AT  BEDTIME   nystatin-triamcinolone ointment (MYCOLOG) Apply 1 application topically 2 (two) times daily as needed (applied to skin folds/groins/under breast skin irritation rash.).   OXYGEN Inhale 3 L/min into the lungs See admin instructions. Inhale  3 L/min of oxygen into the lungs w/CPAP at bedtime and as needed for shortness of breath with "strenuous activity" during the day   pantoprazole (PROTONIX) 40 MG tablet TAKE 1 TABLET BY MOUTH  DAILY   PATADAY 0.2 % SOLN Apply 1 drop to eye daily.   polyethylene glycol (MIRALAX / GLYCOLAX) 17 g packet Take 17 g by mouth daily as needed for mild constipation.   pregabalin (LYRICA) 150 MG capsule TAKE 1 CAPSULE BY MOUTH AT  BEDTIME   QUEtiapine (SEROQUEL) 50 MG tablet TAKE 1 TABLET BY MOUTH AT  BEDTIME   roflumilast (DALIRESP) 500 MCG TABS tablet Take 1 tablet (500 mcg total) by mouth daily.   traZODone (DESYREL) 100 MG tablet TAKE 1 TABLET BY MOUTH AT  BEDTIME   varenicline (CHANTIX) 1 MG tablet Take 1 mg by mouth 2 (two) times daily. (Patient not taking: Reported on 07/06/2021)   venlafaxine XR (EFFEXOR-XR) 150 MG 24 hr capsule TAKE 1 CAPSULE BY MOUTH  DAILY WITH BREAKFAST   witch hazel-glycerin (TUCKS) pad Apply  topically as needed for itching.   No facility-administered encounter medications on file as of 07/27/2021.    Reviewed chart for medication changes and drug therapy problems ahead of medication adherence call.  Attempted to contact patient for medication review and health check, unable to reach patient, left voicemails to return call. Sent note to pharmacist to sign off.    Current COPD regimen: Combivent 20-100 mcg/act  Breztri aer 160-9-4.8 mcg/act   Adherence Review: Does the patient have >5 day  gap between last estimated fill date for maintenance inhaler medications? No   Care Gaps: Colonoscopy-11/24/18 Diabetic Foot Exam-03/27/21 Mammogram-06/08/19 Ophthalmology-NA Dexa Scan - NA Annual Well Visit - NA Micro albumin-03/27/21 Hemoglobin A1c- 03/14/21   Star Rating Drugs: Atorvastatin 20 mg-last fill 05/13/21 100 ds     Ethelene Hal Clinical Pharmacist Assistant [phone]

## 2021-07-30 ENCOUNTER — Encounter: Payer: Self-pay | Admitting: Internal Medicine

## 2021-08-01 DIAGNOSIS — G4733 Obstructive sleep apnea (adult) (pediatric): Secondary | ICD-10-CM | POA: Diagnosis not present

## 2021-08-01 DIAGNOSIS — J449 Chronic obstructive pulmonary disease, unspecified: Secondary | ICD-10-CM | POA: Diagnosis not present

## 2021-08-01 DIAGNOSIS — J441 Chronic obstructive pulmonary disease with (acute) exacerbation: Secondary | ICD-10-CM | POA: Diagnosis not present

## 2021-08-02 ENCOUNTER — Ambulatory Visit (INDEPENDENT_AMBULATORY_CARE_PROVIDER_SITE_OTHER): Payer: Medicare Other | Admitting: Licensed Clinical Social Worker

## 2021-08-02 DIAGNOSIS — F411 Generalized anxiety disorder: Secondary | ICD-10-CM | POA: Diagnosis not present

## 2021-08-02 DIAGNOSIS — F4310 Post-traumatic stress disorder, unspecified: Secondary | ICD-10-CM | POA: Diagnosis not present

## 2021-08-02 DIAGNOSIS — F0630 Mood disorder due to known physiological condition, unspecified: Secondary | ICD-10-CM | POA: Diagnosis not present

## 2021-08-02 DIAGNOSIS — R443 Hallucinations, unspecified: Secondary | ICD-10-CM

## 2021-08-02 NOTE — Progress Notes (Signed)
Virtual Visit via Video Note  I connected with Joan [name] on 08/02/21 at  2:00 PM EDT by a video enabled telemedicine application and verified that I am speaking with the correct person using two identifiers.  Location: Patient: home Provider: home office   I discussed the limitations of evaluation and management by telemedicine and the availability of in person appointments. The patient expressed understanding and agreed to proceed.   I discussed the assessment and treatment plan with the patient. The patient was provided an opportunity to ask questions and all were answered. The patient agreed with the plan and demonstrated an understanding of the instructions.   The patient was advised to call back or seek an in-person evaluation if the symptoms worsen or if the condition fails to improve as anticipated.  I provided 55 minutes of non-face-to-face time during this encounter.  THERAPIST PROGRESS NOTE  Session Time: 2:00 PM to 2:55 PM  Participation Level: Active  Behavioral Response: CasualAlertappropriate  Type of Therapy: Individual Therapy  Treatment Goals addressed: Work on trauma, anxiety, depression, coping  ProgressTowards Goals: Progressing-patient talked about the past so focus was on processing trauma issues, coping  Interventions: Solution Focused, Strength-based, Supportive, and Other: Coping  Summary: Joan [name] is a 61 y.o. female who presents with started smoking again. New guy who moved in seeing him smoke all it took. Thinks in relapse mode already. Wanted a cigarette before that. Doesn't want to smoke pot even though it is a relaxer because of history of drug. Started at 6. Has such an addictive personality. Get to a point tired enough still kept jumping off the wagon finally have 5 years clean. Got back to it and smoking crack bad. Smoking her check in two days. End up stealing. From stores not a friend. Except the get away driver and arrested.  Cop who arrested but let her go. Did have to get criminal photo. Sentenced to jail but out on bond. A friend bailed her out. Had to do public service. Bad because record. Got in trouble again and got community service again. With her husband Joan [name], as far as drug use no right to criticize he used Percocet, prescription but not for oral use, not over use. At then end "did a lot of shit to her". Told everyone what was going on with her said didn't you happen to mention what going on with him?  What gives him the right to talk about her life she asked? Husband got her sentenced once and said he wouldn't do again, close to going to jail. He did end up getting her sentence again ended up in penitentiary wanted to "kill him". Could tell he felt bad. Restraining order against her. His friend Eddie Dibbles pushed him through everything. Patient says husband was not in right mind. No clue he knew was dying. He died of major heart attack family blamed her acting way she did gave a heart attack. Eddie Dibbles, the friend, didn't like her. Kept trying to get husband away from her. Told by police Eddie Dibbles was the one who really said things about her. Husband didn't say much. Went in on August 7 of 2012. Went on 7th on 14th called in by guards. Daughter told him on phone husband had died. Patient told her if he is leaving her don't tell her can't handle it. She said Joan [name] is gone. Don passed away. Even talking about it now patient says has goose bumps thinking about it. Freaked out at the time.  Daughter said need to make power of attorney to her, Joan [name]. Only way to handle situation is through power of attorney. Hated what had to go through to get paperwork signed how to go through search. As far as husband dying she didn't believe anyone. She got out of jail a couple of days after.  With power of attorney daughter was able to escort his kids out. Patient says with his kids patient there for them. He told them negative things about her why they were  against her.  At end of session patient patient recognize talking about the past as part of her goal and focus has other things to talk about including her brothers but says "that is neither here there there prays to [name]" and it is in their hands now.  Therapist noted this as a sign patient is working through her trauma         Patient talked about past with her husband on as well as some drug history assess therapeutic as one of her goals is to work on trauma.  Therapist was utilizing interventions helping patient to work on this by validating patient how she was feeling the difficulty of being in the penitentiary and learning about husband's death at the same time recognizing daughter was there and some when she could depend on at a critical moment being appreciative of her for her help at different times in her life.  Patient also note able to frame things that can be critical of her husband for example he was using drugs and not owning his own issues and putting the blame on patient, standing that his friend was pushing him to do a lot of things these thoughts therapist believes helps patient and indications she is working through her trauma.  Patient noted other things to talk about including her brothers but again indications that patient is working through trauma as she says to therapist it is neither here there anymore prays to [name] and ball is in their court there is a letting go and moving on which is significant for trauma.  Assess helpful for patient to tell her story have witnessed and somebody who is supportive.  Therapist provided active listening open questions supportive interventions Suicidal/Homicidal: No  Plan: Return again in 2 weeks.2.Work on trauma stress anxiety depression  Diagnosis: PTSD, hallucinations, GAD, Mood Disorder in conditions classified elsewhere  Collaboration of Care: Other none needed  Patient/Guardian was advised Release of Information must be obtained prior to any  record release in order to collaborate their care with an outside provider. Patient/Guardian was advised if they have not already done so to contact the registration department to sign all necessary forms in order for [place] to release information regarding their care.   Consent: Patient/Guardian gives verbal consent for treatment and assignment of benefits for services provided during this visit. Patient/Guardian expressed understanding and agreed to proceed.   [name], LCSW 08/02/2021

## 2021-08-09 ENCOUNTER — Encounter: Payer: Self-pay | Admitting: Allergy & Immunology

## 2021-08-09 ENCOUNTER — Ambulatory Visit (INDEPENDENT_AMBULATORY_CARE_PROVIDER_SITE_OTHER): Payer: Medicare Other | Admitting: Allergy & Immunology

## 2021-08-09 DIAGNOSIS — J302 Other seasonal allergic rhinitis: Secondary | ICD-10-CM

## 2021-08-09 DIAGNOSIS — J449 Chronic obstructive pulmonary disease, unspecified: Secondary | ICD-10-CM | POA: Diagnosis not present

## 2021-08-09 DIAGNOSIS — J3089 Other allergic rhinitis: Secondary | ICD-10-CM

## 2021-08-09 MED ORDER — CETIRIZINE HCL 10 MG PO TABS: 10.0000 mg | ORAL_TABLET | Freq: Two times a day (BID) | ORAL | 3 refills | Status: DC

## 2021-08-09 MED ORDER — FLUTICASONE PROPIONATE 50 MCG/ACT NA SUSP: 2.0000 | Freq: Every day | NASAL | 3 refills | Status: DC

## 2021-08-09 NOTE — Progress Notes (Signed)
RE: Joan [name] MRN: [id number] DOB: 08-12-1960 Date of Telemedicine Visit: 08/09/2021  Referring provider: [name], * Primary care provider: [name], MD  Chief Complaint: No chief complaint on file.   Telemedicine Follow Up Visit via Telephone: I connected with Joan [name] for a follow up on 08/09/21 by telephone and verified that I am speaking with the correct person using two identifiers.   I discussed the limitations, risks, security and privacy concerns of performing an evaluation and management service by telephone and the availability of in person appointments. I also discussed with the patient that there may be a patient responsible charge related to this service. The patient expressed understanding and agreed to proceed.  Patient is at home.  Provider is at the office.  Visit start time: 11:15 AM Visit end time: 11:30 AM Insurance consent/check in by: Angela Nevin Medical consent and medical assistant/nurse: Sharyn Lull  History of Present Illness:  She is a 61 y.o. female, who is being followed for COPD and allergic rhinitis. Her previous allergy office visit was in January 2022 with myself.  We last saw her in January 2022.  At that time, we did testing that was positive to grasses, indoor molds, and outdoor molds.  We continue with Zyrtec as well as Flonase and Astelin.  We also continue with Pataday.  For her reflux, we continue Protonix 40 mg daily.  We did not do anything with her breathing since she was followed by Dr. Elsworth Soho for her COPD.  We continued the montelukast as well as Symbicort 2 puffs twice a day and Combivent.  Since last visit, she has done well. She has done well over the last 18 months or so. She spends a lot of the first part of the visit complaining about her bill from 18 months ago.  I reassured her that I have nothing to do with billing.  She says she is just going to let her go go to collections.  Asthma/Respiratory Symptom  History: She continues to follow with Dr. Elsworth Soho. She remains on Breztri 2 puffs twice daily as well as Combivent as needed.  She has not been on a biologic.  She is also on Dalisrep for her COPD.  She remains on her CPAP for her sleep apnea.  She has a history of benign appearing pulmonary nodules.  Per her last visit with pulmonology in April, she had quit smoking back in February 2023.  Allergic Rhinitis Symptom History: She is on the montelukast 70m daily for her allergies. She does this year round. She has Benadryl in the morning and night. She has used Flonase, although she denies that it smells like flower. She has some sinus pain and pressure. She reports that she can catch it before sinuses get too out of control.    Otherwise, there have been no changes to her past medical history, surgical history, family history, or social history.  Assessment and Plan:  TJessahis a 61y.o. female with:  Seasonal and perennial allergic rhinitis (grasses, indoor molds and outdoor molds)   COPD - followed by Dr. AElsworth Soho  Gastroesophageal reflux disease - on PPI   We are going to add on cetirizine 1-2 times daily for control of her allergies.  We will also send in refills for her Flonase.  With her severe COPD, I do not think she is a candidate for allergen immunotherapy.  She also has issues with affording her bills, so allergy shots would only add to  that.  Diagnostics: None.  Medication List:  Current Outpatient Medications  Medication Sig Dispense Refill   Accu-Chek Softclix Lancets lancets Use as instructed 100 each 12   acyclovir (ZOVIRAX) 400 MG tablet TAKE 1 TABLET BY MOUTH  TWICE DAILY 180 tablet 3   albuterol (PROVENTIL) (2.5 MG/3ML) 0.083% nebulizer solution Take 3 mLs (2.5 mg total) by nebulization 2 (two) times daily. And every 6 hours as needed.  DX: J44.9 360 mL 3   atenolol (TENORMIN) 25 MG tablet Take 25 mg by mouth daily.     atorvastatin (LIPITOR) 20 MG tablet TAKE 1 TABLET BY  MOUTH  DAILY 90 tablet 3   blood glucose meter kit and supplies Dispense based on patient and insurance preference. Use up to two times daily as directed. (FOR ICD-10 E10.9, E11.9). 1 each 0   Budeson-Glycopyrrol-Formoterol (BREZTRI AEROSPHERE) 160-9-4.8 MCG/ACT AERO Inhale 2 puffs into the lungs in the morning and at bedtime. 10.7 g 0   buPROPion (WELLBUTRIN XL) 150 MG 24 hr tablet TAKE 1 TABLET BY MOUTH  DAILY 30 tablet 11   busPIRone (BUSPAR) 5 MG tablet TAKE 1/2 TO 1 TABLET BY MOUTH  TWICE DAILY AS NEEDED 180 tablet 0   Camphor-Menthol-Methyl Sal (SALONPAS) 3.03-02-08 % PTCH Apply 1 patch topically daily as needed.     cyclobenzaprine (FLEXERIL) 10 MG tablet Take 1 tablet (10 mg total) by mouth at bedtime. 90 tablet 1   glucose blood test strip Use as instructed 100 each 12   hydrochlorothiazide (HYDRODIURIL) 25 MG tablet TAKE 1 TABLET BY MOUTH  DAILY 90 tablet 3   Ipratropium-Albuterol (COMBIVENT RESPIMAT) 20-100 MCG/ACT AERS respimat Inhale 1 puff into the lungs every 6 (six) hours as needed for wheezing or shortness of breath. 4 g 11   levothyroxine (SYNTHROID) 25 MCG tablet TAKE 1 TABLET BY MOUTH  DAILY BEFORE BREAKFAST 90 tablet 3   Liniments (SALONPAS ARTHRITIS PAIN RELIEF EX) Apply 1 patch topically daily as needed (to painful sites- remove as directed).      montelukast (SINGULAIR) 10 MG tablet TAKE 1 TABLET BY MOUTH AT  BEDTIME 90 tablet 3   nystatin-triamcinolone ointment (MYCOLOG) Apply 1 application topically 2 (two) times daily as needed (applied to skin folds/groins/under breast skin irritation rash.). 100 g 3   OXYGEN Inhale 3 L/min into the lungs See admin instructions. Inhale  3 L/min of oxygen into the lungs w/CPAP at bedtime and as needed for shortness of breath with "strenuous activity" during the day     pantoprazole (PROTONIX) 40 MG tablet TAKE 1 TABLET BY MOUTH  DAILY 90 tablet 3   PATADAY 0.2 % SOLN Apply 1 drop to eye daily.     polyethylene glycol (MIRALAX / GLYCOLAX) 17  g packet Take 17 g by mouth daily as needed for mild constipation. 28 packet 0   pregabalin (LYRICA) 150 MG capsule TAKE 1 CAPSULE BY MOUTH AT  BEDTIME 90 capsule 1   QUEtiapine (SEROQUEL) 50 MG tablet TAKE 1 TABLET BY MOUTH AT  BEDTIME 30 tablet 11   roflumilast (DALIRESP) 500 MCG TABS tablet Take 1 tablet (500 mcg total) by mouth daily. 30 tablet 11   traZODone (DESYREL) 100 MG tablet TAKE 1 TABLET BY MOUTH AT  BEDTIME 90 tablet 3   varenicline (CHANTIX) 1 MG tablet Take 1 mg by mouth 2 (two) times daily. (Patient not taking: Reported on 07/06/2021)     venlafaxine XR (EFFEXOR-XR) 150 MG 24 hr capsule TAKE 1 CAPSULE BY MOUTH  DAILY  WITH BREAKFAST 90 capsule 3   witch hazel-glycerin (TUCKS) pad Apply topically as needed for itching.     No current facility-administered medications for this visit.   Allergies: Allergies  Allergen Reactions   Keflex [Cephalexin] Rash and Other (See Comments)    Has tolerated amoxicillin since had keflex   Shellfish Allergy Shortness Of Breath, Nausea And Vomiting and Other (See Comments)    Stomach cramps   Tetracyclines & Related Anaphylaxis, Swelling and Other (See Comments)    Throat swelling requiring hospitalization   Dilaudid [Hydromorphone Hcl] Other (See Comments)    "Lethargy"   Prednisone Other (See Comments)    "counter reacts", has tolerated IV medrol   Incruse Ellipta [Umeclidinium Bromide] Nausea Only   Other    Tuberculin Tests Other (See Comments)    False postive   I reviewed her past medical history, social history, family history, and environmental history and no significant changes have been reported from previous visits.  Review of Systems  Constitutional:  Negative for activity change and appetite change.  HENT:  Negative for congestion, postnasal drip, rhinorrhea, sinus pressure and sore throat.   Eyes:  Negative for pain, discharge, redness and itching.  Respiratory:  Positive for cough, shortness of breath and stridor.  Negative for wheezing.   Gastrointestinal:  Negative for diarrhea, nausea and vomiting.  Endocrine: Negative for cold intolerance, heat intolerance and polydipsia.  Musculoskeletal:  Negative for arthralgias, joint swelling and myalgias.  Skin:  Negative for rash.  Allergic/Immunologic: Positive for environmental allergies. Negative for food allergies.    Objective:  Physical exam not obtained as encounter was done via telephone.   Previous notes and tests were reviewed.  I discussed the assessment and treatment plan with the patient. The patient was provided an opportunity to ask questions and all were answered. The patient agreed with the plan and demonstrated an understanding of the instructions.   The patient was advised to call back or seek an in-person evaluation if the symptoms worsen or if the condition fails to improve as anticipated.  I provided 15 minutes of non-face-to-face time during this encounter.  It was my pleasure to participate in [facility]'s care today. Please feel free to contact me with any questions or concerns.   Sincerely,  Valentina Shaggy, MD

## 2021-08-10 ENCOUNTER — Ambulatory Visit: Payer: Medicare Other | Admitting: Podiatry

## 2021-08-10 DIAGNOSIS — L03031 Cellulitis of right toe: Secondary | ICD-10-CM

## 2021-08-10 DIAGNOSIS — L02611 Cutaneous abscess of right foot: Secondary | ICD-10-CM

## 2021-08-10 MED ORDER — SILVER SULFADIAZINE 1 % EX CREA: 1.0000 | TOPICAL_CREAM | Freq: Every day | CUTANEOUS | 1 refills | Status: DC

## 2021-08-10 MED ORDER — CLINDAMYCIN HCL 300 MG PO CAPS: 300.0000 mg | ORAL_CAPSULE | Freq: Three times a day (TID) | ORAL | 0 refills | Status: DC

## 2021-08-14 ENCOUNTER — Ambulatory Visit (INDEPENDENT_AMBULATORY_CARE_PROVIDER_SITE_OTHER): Payer: Medicare Other | Admitting: Internal Medicine

## 2021-08-14 ENCOUNTER — Encounter: Payer: Self-pay | Admitting: Internal Medicine

## 2021-08-14 DIAGNOSIS — J9611 Chronic respiratory failure with hypoxia: Secondary | ICD-10-CM

## 2021-08-14 DIAGNOSIS — M159 Polyosteoarthritis, unspecified: Secondary | ICD-10-CM | POA: Diagnosis not present

## 2021-08-14 MED ORDER — PREGABALIN 150 MG PO CAPS
150.0000 mg | ORAL_CAPSULE | Freq: Two times a day (BID) | ORAL | 1 refills | Status: DC
Start: 2021-08-14 — End: 2021-10-12

## 2021-08-14 NOTE — Progress Notes (Signed)
   Subjective:   Patient ID: Joan [name], female    DOB: Apr 18, 1960, 61 y.o.   MRN: [id number]  HPI The patient is a 61 YO female coming in for worsening neuropathy.   Review of Systems  Constitutional:  Positive for activity change. Negative for appetite change, chills, fatigue, fever and unexpected weight change.  Respiratory:  Positive for cough and shortness of breath.   Cardiovascular: Negative.   Gastrointestinal: Negative.   Musculoskeletal:  Positive for arthralgias, back pain and myalgias. Negative for gait problem and joint swelling.  Skin: Negative.   Neurological:  Positive for numbness.    Objective:  Physical Exam Constitutional:      Appearance: Normal appearance.  HENT:     Head: Normocephalic.  Cardiovascular:     Rate and Rhythm: Normal rate and regular rhythm.  Pulmonary:     Effort: Pulmonary effort is normal.     Breath sounds: Wheezing and rhonchi present.  Musculoskeletal:        General: Tenderness present. Normal range of motion.  Skin:    General: Skin is warm and dry.     Findings: Lesion present.  Neurological:     General: No focal deficit present.     Mental Status: She is alert and oriented to person, place, and time.     Coordination: Coordination abnormal.     Vitals:   08/14/21 1422  BP: 130/80  Pulse: 78  Resp: 18  Temp: 98.2 F (36.8 C)  SpO2: 98%  Weight: 206 lb (93.4 kg)    Assessment & Plan:

## 2021-08-14 NOTE — Patient Instructions (Signed)
We will increase the lyrica to twice a day for pain.

## 2021-08-15 ENCOUNTER — Telehealth: Payer: Self-pay | Admitting: *Deleted

## 2021-08-15 ENCOUNTER — Other Ambulatory Visit: Payer: Self-pay | Admitting: Podiatry

## 2021-08-15 MED ORDER — LEVOFLOXACIN 750 MG PO TABS: 750.0000 mg | ORAL_TABLET | Freq: Every day | ORAL | 0 refills | Status: DC

## 2021-08-15 NOTE — Assessment & Plan Note (Signed)
She does have some neuropathy in her legs due to spinal stenosis. We will increase lyrica to 150 mg BID from current daily dosing. She will let us know in 2-3 weeks if this is effective. We have ability to increase dosing if needed as well.

## 2021-08-15 NOTE — Assessment & Plan Note (Signed)
Overall stable and lung exam typical for baseline today. Using oxygen chronically and would advise to continue.

## 2021-08-15 NOTE — Telephone Encounter (Signed)
Spoke with patient on the phone today.  She has discontinued the clindamycin.  Patient states that she is known to tolerate Levaquin 750 mg daily.  Prescription for Levaquin 750 mg daily sent to the pharmacy.  DC clindamycin.  Should conditions persist or worsen go immediately to the emergency department.  Patient verbalized understanding.  Thanks, Dr. Amalia Hailey

## 2021-08-15 NOTE — Telephone Encounter (Signed)
Patient called and left message on the nurse voicemail and states she thinks she is having a severe allergic reaction to an antibiotic Dr. Amalia Hailey gave her on Friday. She is having burning in her chest that also is moving into her throat, jaw pain radiating up the left side of her face to her ear.   I called the patient at 12:45pm. She has taken a full 4 day dose and 1 capsule this morning of clindamycin. She took Benadryl at 8:30 this morning. She said that she is still having a lot of chest pain. I advised she not take anymore of the medication and go straight to the ED or if she feels she cannot drive, should call 035. Patient states she has a life alert button and she was going to give it some more time to see if she feels better. I recommended she should go now to be checked out. She told me she would.   I told patient I would inform Dr. Amalia Hailey so he is aware and change her medication if need be.

## 2021-08-15 NOTE — Progress Notes (Signed)
HPI: 61 y.o. female presenting today as a reestablish new patient for evaluation of bilateral foot pain RT > LT.  Patient began to develop blisters on both of her feet.  This has been ongoing for about 2-3 weeks now.  Gradual onset.  She denies a history of injury.  She has been experiencing cramping with numbness and burning sensations.  She presents for further treatment and evaluation  Past Medical History:  Diagnosis Date   Acute respiratory failure with hypoxia ([place])    Alcoholism and drug addiction in family    Anxiety    Appendicitis    Arthritis    "knees, hips, lower back" (09/25/2016)   Asthma    Bipolar disorder ([place])    Chronic bronchitis ([place])    "all the time" (09/25/2016)   Chronic leg pain    RLE   Chronic lower back pain    COPD (chronic obstructive pulmonary disease) ([place])    Depression    Diabetes mellitus without complication (HCC)    Diabetic neuropathy ([place])    Early cataracts, bilateral    Emphysema of lung ([place])    Generalized anxiety disorder 02/09/2020   GERD (gastroesophageal reflux disease)    Headache    Hepatitis C    "treated back in 2001-2002"   High cholesterol    Hypertension    Hypothyroidism    Incarcerated ventral hernia 04/04/2017   Mood disorder in conditions classified elsewhere 02/09/2020   On home oxygen therapy    "2L; at nighttime" (04/04/2017)   OSA on CPAP    CPAP with Oxygen 2 liters   Paranoid ([place])    Pneumonia    "all the time" (09/25/2016)   Positive PPD    with negative chest x ray   PTSD (post-traumatic stress disorder) 02/09/2020   Tachycardia    patient reports with exertion ; denies any other conditions    Thyroid disease    Wears glasses    Wears partial dentures     Past Surgical History:  Procedure Laterality Date   BACK SURGERY     BIOPSY  11/24/2018   Procedure: BIOPSY;  Surgeon: Thornton Park, MD;  Location: [facility];  Service: Gastroenterology;;   CARDIAC CATHETERIZATION     COLONOSCOPY      COLONOSCOPY WITH PROPOFOL N/A 11/24/2018   Procedure: COLONOSCOPY WITH PROPOFOL;  Surgeon: Thornton Park, MD;  Location: [facility];  Service: Gastroenterology;  Laterality: N/A;   CYST EXCISION     removal of cyst in sinuses   DILATION AND CURETTAGE OF UTERUS     ESOPHAGOGASTRODUODENOSCOPY (EGD) WITH PROPOFOL N/A 11/24/2018   Procedure: ESOPHAGOGASTRODUODENOSCOPY (EGD) WITH PROPOFOL;  Surgeon: Thornton Park, MD;  Location: [facility];  Service: Gastroenterology;  Laterality: N/A;   FOOT SURGERY Bilateral    bone removed from 4th and 5 th toes and put in pin then took pin out   [place]  ~ 2014/2015   "removed mesh & infection"   [place] N/A 04/04/2017   Procedure: LAPAROSCOPIC INCISIONAL HERNIA REPAIR WITH MESH;  Surgeon: Fanny Skates, MD;  Location: [facility];  Service: General;  Laterality: N/A;   LACERATION REPAIR Right    repair wrist artery from laceration from ice skate   LAPAROSCOPIC APPENDECTOMY N/A 03/31/2019   Procedure: APPENDECTOMY LAPAROSCOPIC;  Surgeon: Ralene Ok, MD;  Location: [facility];  Service: General;  Laterality: N/A;   LAPAROSCOPIC APPENDECTOMY N/A 07/09/2019   Procedure: APPENDECTOMY LAPAROSCOPIC;  Surgeon:  Ralene Ok, MD;  Location: [facility];  Service: General;  Laterality: N/A;   LAPAROSCOPIC INCISIONAL / UMBILICAL / [place]  04/04/2017   LAPAROSCOPIC INCISIONAL HERNIA REPAIR WITH MESH   LIPOMA EXCISION Right    fattty lipoma in neck   NASAL SEPTUM SURGERY     POSTERIOR LUMBAR FUSION  2015/2016 X 2   "added rods and screws"   SINOSCOPY     TOTAL HIP ARTHROPLASTY Right 02/05/2017   Procedure: RIGHT TOTAL HIP ARTHROPLASTY;  Surgeon: [name], MD;  Location: [facility];  Service: Orthopedics;  Laterality: Right;   TOTAL HIP ARTHROPLASTY Left 06/18/2017   Procedure: LEFT TOTAL HIP ARTHROPLASTY;  Surgeon: [name], MD;  Location: [facility];  Service: Orthopedics;  Laterality: Left;    TOTAL HIP REVISION Left 01/26/2019   Procedure: TOTAL HIP REVISION;  Surgeon: Paralee Cancel, MD;  Location: [facility];  Service: Orthopedics;  Laterality: Left;  2 hrs   TUBAL LIGATION     UMBILICAL HERNIA REPAIR  ~ 2013   w/mesh   [facility] END[facility]      Allergies  Allergen Reactions   Keflex [Cephalexin] Rash and Other (See Comments)    Has tolerated amoxicillin since had keflex   Shellfish Allergy Shortness Of Breath, Nausea And Vomiting and Other (See Comments)    Stomach cramps   Tetracyclines & Related Anaphylaxis, Swelling and Other (See Comments)    Throat swelling requiring hospitalization   Dilaudid [Hydromorphone Hcl] Other (See Comments)    "Lethargy"   Prednisone Other (See Comments)    "counter reacts", has tolerated IV medrol   Incruse Ellipta [Umeclidinium Bromide] Nausea Only   Other    Tuberculin Tests Other (See Comments)    False postive       Physical Exam: General: The patient is alert and oriented x3 in no acute distress.  Dermatology: Absence of the bilateral great toenails noted with callus.  This is baseline.  Vascular: Erythema with edema noted to the bilateral great toes as well as the right second digit with a serous blister.  No purulence.  No malodor.  The blister is superficial and does not extend into the subcutaneous tissue.  Please see above noted photo  VAS [place] ABI W/WO TBI 07/06/2021 Summary:  Right: Resting right ankle-brachial index is within normal range. No  evidence of significant right lower extremity arterial disease. The right  toe-brachial index is normal.   Left: Resting left ankle-brachial index is within normal range. No  evidence of significant left lower extremity arterial disease. The left  toe-brachial index is normal.   Neurological: Light touch and protective threshold grossly intact  Musculoskeletal Exam: No pedal deformities noted  Assessment: 1.  Erythema with edema bilateral great toes and right second digit 2.   Blister right second digit   Plan of Care:  1. Patient evaluated.  2.  Prescription for clindamycin 300 mg 3 times daily #30 sent to the pharmacy.  Clindamycin was prescribed due to allergies of Keflex and tetracyclines. 3.  Silvadene cream provided for the patient to apply to the blister lesion daily with a Band-Aid 4.  Recommend open toe shoes that do not constrict the toebox area or sandals  5.  Return to clinic in 3 weeks       Edrick Kins, DPM [facility]  Dr. Edrick Kins, DPM    [address]  [place], [place] [address]                Office [phone]  Fax [phone]

## 2021-08-16 ENCOUNTER — Ambulatory Visit (HOSPITAL_COMMUNITY): Payer: Medicare Other | Admitting: Licensed Clinical Social Worker

## 2021-08-16 NOTE — Progress Notes (Signed)
Therapist contacted patient for session through My Chart and she did not respond session is a no show

## 2021-08-20 ENCOUNTER — Other Ambulatory Visit: Payer: Medicare Other | Admitting: Student

## 2021-08-20 ENCOUNTER — Telehealth: Payer: Self-pay | Admitting: Internal Medicine

## 2021-08-20 DIAGNOSIS — J449 Chronic obstructive pulmonary disease, unspecified: Secondary | ICD-10-CM

## 2021-08-20 DIAGNOSIS — Z515 Encounter for palliative care: Secondary | ICD-10-CM | POA: Diagnosis not present

## 2021-08-20 DIAGNOSIS — R52 Pain, unspecified: Secondary | ICD-10-CM

## 2021-08-20 NOTE — Progress Notes (Signed)
[facility] Pall[facility] Consult Note Telephone: [phone]  Fax: [phone]    Date of encounter: 08/20/21 1:05 PM PATIENT NAME: Joan [name] [id number]   [phone] (home)  DOB: Aug 20, 1960 MRN: [id number] PRIMARY CARE PROVIDER:    [name], MD,  [place] [address] [phone]  REFERRING PROVIDER:   [name], MD [address] [place],  [place] [address] [phone]  RESPONSIBLE PARTY:    Contact Information     Name Relation Home Work Mobile   Joan [name] Daughter [phone]  [phone]   Joan [name] Sister [phone]  [phone]        I met face to face with patient in the home. Palliative Care was asked to follow this patient by consultation request of  Joan [name], * to address advance care planning and complex medical decision making. This is a follow up visit.                                   ASSESSMENT AND PLAN / RECOMMENDATIONS:   Advance Care Planning/Goals of Care: Goals include to maximize quality of life and symptom management. Patient/health care surrogate gave his/her permission to discuss.  CODE STATUS: Full Code  Symptom Management/Plan:  Pain-patient with chronic neck, back and hip pain. She endorses worsening pain; Lyrica increased  to 150 mg BID in past week per PCP. Continue naproxen, flexeril, Salonpas patch as directed. She is to follow up with pain management as scheduled.   COPD- breathing has been stable; endorses shortness of breath with exertion. She is encouraged to wear oxygen continuously as she removes at times during the day. Continue oxygen at 3 lpm via nasal canula; CPAP at night. Continue Breztri, albuterol PRN as directed.   Follow up Palliative Care Visit: Palliative care will continue to follow for complex medical decision making, advance care planning, and clarification of  goals. Return in 8 weeks or prn.   This visit was coded based on medical decision making (MDM).  PPS: 50%  HOSPICE ELIGIBILITY/DIAGNOSIS: TBD  Chief Complaint: Palliative Medicine follow up visit.   HISTORY OF PRESENT ILLNESS:  Joan [name] is a 61 y.o. year old female  with COPD, chronic respiratory failure with hypoxia, OSA on CPAP, CAD, aortic atherosclerosis, hypertension, hyperlipidemia, type 2 diabetes, hypothyroidism, depression, anxiety, lumbar stenosis.   Patient resides at home; has a couple of roommates. No worsening of shortness of breath. States she has seen pain management in [place]. She does endorse chronic neck,  back and hip pain, neuropathy. She had neck injections in the past. No therapy currently. Appetite is fair; eating one meal a day. Lyrica recently increased to 150 mg BID per her PCP. Mood is stable. Patient was started on clindamycin due to blisters on right 2nd toe, swelling and erythema to bilateral great toes. She had a reaction to clindamycin and was switched to Levaquin. Areas have improved. A 10-point ROS is negative, except for the pertinent positives and negatives detailed per the HPI.  History obtained from review of EMR, discussion with primary team, and interview with family, facility staff/caregiver and/or Joan [name].  I reviewed available labs, medications, imaging, studies and related documents from the EMR.  Records reviewed and summarized above.   Physical Exam: Weight: 206 pounds Pulse 72, resp 18, sats 92% on room air Constitutional: NAD General: frail appearing  EYES: anicteric sclera, lids intact, no discharge  ENMT: intact hearing, oral mucous membranes moist, dentition intact CV: S1S2, RRR, no LE edema Pulmonary: expiratory wheezing, no increased work of breathing, no cough, room air Abdomen:  normo-active BS + 4 quadrants, soft and non tender, no ascites GU: deferred MSK: no sarcopenia, moves all extremities, ambulatory Skin:  warm and dry, no rashes, blister to right 2nd toe is scabbed over Neuro:  no generalized weakness Psych: non-anxious affect, A and O x 3 Hem/lymph/immuno: no widespread bruising   Thank you for the opportunity to participate in the care of Joan [name].  The palliative care team will continue to follow. Please call our office at [phone] if we can be of additional assistance.   Joan Slocumb, NP   COVID-19 PATIENT SCREENING TOOL Asked and negative response unless otherwise noted:   Have you had symptoms of covid, tested positive or been in contact with someone with symptoms/positive test in the past 5-10 days? No

## 2021-08-23 ENCOUNTER — Other Ambulatory Visit (HOSPITAL_COMMUNITY): Payer: Self-pay | Admitting: Psychiatry

## 2021-08-27 ENCOUNTER — Ambulatory Visit (INDEPENDENT_AMBULATORY_CARE_PROVIDER_SITE_OTHER): Payer: Medicare Other | Admitting: *Deleted

## 2021-08-27 ENCOUNTER — Encounter: Payer: Self-pay | Admitting: *Deleted

## 2021-08-27 VITALS — Ht 68.0 in | Wt 204.0 lb

## 2021-08-27 DIAGNOSIS — Z0000 Encounter for general adult medical examination without abnormal findings: Secondary | ICD-10-CM | POA: Diagnosis not present

## 2021-08-27 NOTE — Progress Notes (Signed)
Subjective:   Joan [name] is a 61 y.o. female who presents for Medicare Annual (Subsequent) preventive examination.  I connected with  Joan [name] on 08/27/21 by a audio enabled telemedicine application and verified that I am speaking with the correct person using two identifiers.  Patient Location: Home  Provider Location: Office/Clinic  I discussed the limitations of evaluation and management by telemedicine. The patient expressed understanding and agreed to proceed.   Review of Systems    Defer to PCP       Objective:    Today's Vitals   08/27/21 1312 08/27/21 1316  Weight: 204 lb (92.5 kg)   Height: _0  (1.727 m)   PainSc:  10-Worst pain ever   Body mass index is 31.02 kg/m.     08/27/2021    1:49 PM 04/03/2021    1:10 AM 03/14/2021    3:00 AM 08/21/2020    4:00 PM 08/07/2020    6:47 PM 05/30/2020    1:05 PM 07/09/2019    7:10 AM  Advanced Directives  Does Patient Have a Medical Advance Directive? No _1  Yes  Type of Armed forces technical officer of [employer] Power of [place];Living will [place]  Does patient want to make changes to medical advance directive?  No - Patient declined No - Patient declined   No - Patient declined No - Patient declined  Copy of [name] in Chart?       No - copy requested  Would patient like information on creating a medical advance directive? No - Patient declined   Yes (ED - Information included in AVS) No - Patient declined      Current Medications (verified) Outpatient Encounter Medications as of 08/27/2021  Medication Sig   Accu-Chek Softclix Lancets lancets Use as instructed   acyclovir (ZOVIRAX) 400 MG tablet TAKE 1 TABLET BY MOUTH  TWICE DAILY   albuterol (PROVENTIL) (2.5 MG/3ML) 0.083% nebulizer solution Take 3 mLs (2.5 mg total) by nebulization 2 (two) times daily. And every 6 hours as needed.  DX: J44.9    atenolol (TENORMIN) 25 MG tablet Take 25 mg by mouth daily.   atorvastatin (LIPITOR) 20 MG tablet TAKE 1 TABLET BY MOUTH  DAILY   blood glucose meter kit and supplies Dispense based on patient and insurance preference. Use up to two times daily as directed. (FOR ICD-10 E10.9, E11.9).   Budeson-Glycopyrrol-Formoterol (BREZTRI AEROSPHERE) 160-9-4.8 MCG/ACT AERO Inhale 2 puffs into the lungs in the morning and at bedtime.   buPROPion (WELLBUTRIN XL) 150 MG 24 hr tablet TAKE 1 TABLET BY MOUTH  DAILY   busPIRone (BUSPAR) 5 MG tablet TAKE 1/2 TO 1 TABLET BY MOUTH  TWICE DAILY AS NEEDED   cetirizine (ZYRTEC) 10 MG tablet Take 1 tablet (10 mg total) by mouth 2 (two) times daily.   cyclobenzaprine (FLEXERIL) 10 MG tablet Take 1 tablet (10 mg total) by mouth at bedtime.   fluticasone (FLONASE) 50 MCG/ACT nasal spray Place 2 sprays into both nostrils daily.   glucose blood test strip Use as instructed   hydrochlorothiazide (HYDRODIURIL) 25 MG tablet TAKE 1 TABLET BY MOUTH  DAILY   Ipratropium-Albuterol (COMBIVENT RESPIMAT) 20-100 MCG/ACT AERS respimat Inhale 1 puff into the lungs every 6 (six) hours as needed for wheezing or shortness of breath.   levofloxacin (LEVAQUIN) 750 MG tablet Take 1 tablet (750 mg total) by mouth daily.  levothyroxine (SYNTHROID) 25 MCG tablet TAKE 1 TABLET BY MOUTH  DAILY BEFORE BREAKFAST   Liniments (SALONPAS ARTHRITIS PAIN RELIEF EX) Apply 1 patch topically daily as needed (to painful sites- remove as directed).    montelukast (SINGULAIR) 10 MG tablet TAKE 1 TABLET BY MOUTH AT  BEDTIME   nystatin-triamcinolone ointment (MYCOLOG) Apply 1 application topically 2 (two) times daily as needed (applied to skin folds/groins/under breast skin irritation rash.).   OXYGEN Inhale 3 L/min into the lungs See admin instructions. Inhale  3 L/min of oxygen into the lungs w/CPAP at bedtime and as needed for shortness of breath with "strenuous activity" during the day   pantoprazole (PROTONIX) 40  MG tablet TAKE 1 TABLET BY MOUTH  DAILY   PATADAY 0.2 % SOLN Apply 1 drop to eye daily.   polyethylene glycol (MIRALAX / GLYCOLAX) 17 g packet Take 17 g by mouth daily as needed for mild constipation.   pregabalin (LYRICA) 150 MG capsule Take 1 capsule (150 mg total) by mouth 2 (two) times daily.   QUEtiapine (SEROQUEL) 50 MG tablet TAKE 1 TABLET BY MOUTH AT  BEDTIME   roflumilast (DALIRESP) 500 MCG TABS tablet Take 1 tablet (500 mcg total) by mouth daily.   silver sulfADIAZINE (SILVADENE) 1 % cream Apply 1 Application topically daily.   traZODone (DESYREL) 100 MG tablet TAKE 1 TABLET BY MOUTH AT  BEDTIME   venlafaxine XR (EFFEXOR-XR) 150 MG 24 hr capsule TAKE 1 CAPSULE BY MOUTH  DAILY WITH BREAKFAST   witch hazel-glycerin (TUCKS) pad Apply topically as needed for itching.   Camphor-Menthol-Methyl Sal (SALONPAS) 3.03-02-08 % PTCH Apply 1 patch topically daily as needed.   clindamycin (CLEOCIN) 300 MG capsule Take 1 capsule (300 mg total) by mouth 3 (three) times daily. (Patient not taking: Reported on 08/27/2021)   No facility-administered encounter medications on file as of 08/27/2021.    Allergies (verified) Keflex [cephalexin], Shellfish allergy, Tetracyclines & related, Dilaudid [hydromorphone hcl], Prednisone, Clindamycin/lincomycin, Incruse ellipta [umeclidinium bromide], Other, and Tuberculin tests   History: Past Medical History:  Diagnosis Date   Acute respiratory failure with hypoxia ([place])    Alcoholism and drug addiction in family    Anxiety    Appendicitis    Arthritis    "knees, hips, lower back" (09/25/2016)   Asthma    Bipolar disorder ([place])    Chronic bronchitis ([place])    "all the time" (09/25/2016)   Chronic leg pain    RLE   Chronic lower back pain    COPD (chronic obstructive pulmonary disease) ([place])    Depression    Diabetes mellitus without complication ([place])    Diabetic neuropathy ([place])    Early cataracts, bilateral    Emphysema of lung ([place])    Generalized anxiety  disorder 02/09/2020   GERD (gastroesophageal reflux disease)    Headache    Hepatitis C    "treated back in 2001-2002"   High cholesterol    Hypertension    Hypothyroidism    Incarcerated ventral hernia 04/04/2017   Mood disorder in conditions classified elsewhere 02/09/2020   On home oxygen therapy    "2L; at nighttime" (04/04/2017)   OSA on CPAP    CPAP with Oxygen 2 liters   Paranoid ([place])    Pneumonia    "all the time" (09/25/2016)   Positive PPD    with negative chest x ray   PTSD (post-traumatic stress disorder) 02/09/2020   Tachycardia    patient reports with exertion ; denies any other  conditions    Thyroid disease    Wears glasses    Wears partial dentures    Past Surgical History:  Procedure Laterality Date   BACK SURGERY     BIOPSY  11/24/2018   Procedure: BIOPSY;  Surgeon: Thornton Park, MD;  [facility]: [facility];  Service: Gastroenterology;;   CARDIAC CATHETERIZATION     COLONOSCOPY     COLONOSCOPY WITH PROPOFOL N/A 11/24/2018   Procedure: COLONOSCOPY WITH PROPOFOL;  Surgeon: Thornton Park, MD;  [facility]: [facility];  Service: Gastroenterology;  Laterality: N/A;   CYST EXCISION     removal of cyst in sinuses   DILATION AND CURETTAGE OF UTERUS     ESOPHAGOGASTRODUODENOSCOPY (EGD) WITH PROPOFOL N/A 11/24/2018   Procedure: ESOPHAGOGASTRODUODENOSCOPY (EGD) WITH PROPOFOL;  Surgeon: Thornton Park, MD;  [facility];  Service: Gastroenterology;  Laterality: N/A;   FOOT SURGERY Bilateral    bone removed from 4th and 5 th toes and put in pin then took pin out   [place]  ~ 2014/2015   "removed mesh & infection"   [place] N/A 04/04/2017   Procedure: LAPAROSCOPIC INCISIONAL HERNIA REPAIR WITH MESH;  Surgeon: Fanny Skates, MD;  Location: [place];  Service: General;  Laterality: N/A;   LACERATION REPAIR Right    repair wrist artery from laceration from ice skate   LAPAROSCOPIC APPENDECTOMY N/A 03/31/2019    Procedure: APPENDECTOMY LAPAROSCOPIC;  Surgeon: Ralene Ok, MD;  [facility]: [facility];  Service: General;  Laterality: N/A;   LAPAROSCOPIC APPENDECTOMY N/A 07/09/2019   Procedure: APPENDECTOMY LAPAROSCOPIC;  Surgeon: Ralene Ok, MD;  Location: [place];  Service: General;  Laterality: N/A;   LAPAROSCOPIC INCISIONAL / UMBILICAL / [place]  04/04/2017   LAPAROSCOPIC INCISIONAL HERNIA REPAIR WITH MESH   LIPOMA EXCISION Right    fattty lipoma in neck   NASAL SEPTUM SURGERY     POSTERIOR LUMBAR FUSION  2015/2016 X 2   "added rods and screws"   SINOSCOPY     TOTAL HIP ARTHROPLASTY Right 02/05/2017   Procedure: RIGHT TOTAL HIP ARTHROPLASTY;  Surgeon: [name], MD;  [facility];  Service: Orthopedics;  Laterality: Right;   TOTAL HIP ARTHROPLASTY Left 06/18/2017   Procedure: LEFT TOTAL HIP ARTHROPLASTY;  Surgeon: [name], MD;  [facility];  Service: Orthopedics;  Laterality: Left;   TOTAL HIP REVISION Left 01/26/2019   Procedure: TOTAL HIP REVISION;  Surgeon: Paralee Cancel, MD;  [facility];  Service: Orthopedics;  Laterality: Left;  2 hrs   TUBAL LIGATION     UMBILICAL HERNIA REPAIR  ~ 2013   w/mesh   UPPER GI ENDOSCOPY     Family History  Problem Relation Age of Onset   Diabetes Mother    Hypertension Mother    Hypertension Father    Cancer Father        lung   Breast cancer Neg Hx    Social History   Socioeconomic History   Marital status: Widowed    Spouse name: Not on file   Number of children: 3   Years of education: Not on file   Highest education level: Not on file  Occupational History   Not on file  Tobacco Use   Smoking status: Every Day    Packs/day: 0.50    Years: 44.00    Total pack years: 22.00    Types: Cigarettes    Passive exposure: Current   Smokeless tobacco: Never  Tobacco comments:    down to 0.5ppd 06/30/20/jm  Vaping Use   Vaping Use: Never used  Substance and Sexual Activity   Alcohol use: Not Currently    Drug use: Not Currently    Types: "Crack" cocaine    Comment:  "nothing since 01/12/2012"   Sexual activity: Not Currently    Partners: Male  Other Topics Concern   Not on file  Social History Narrative   Not on file   Social Determinants of Health   Financial Resource Strain: High Risk (05/05/2020)   Overall Financial Resource Strain (CARDIA)    Difficulty of Paying Living Expenses: Hard  Food Insecurity: Food Insecurity Present (08/27/2021)   Hunger Vital Sign    Worried About Running Out of Food in the Last Year: Sometimes true    Ran Out of Food in the Last Year: Never true  Transportation Needs: No Transportation Needs (08/27/2021)   PRAPARE - Hydrologist (Medical): No    Lack of Transportation (Non-Medical): No  Physical Activity: Inactive (08/27/2021)   Exercise Vital Sign    Days of Exercise per Week: 0 days    Minutes of Exercise per Session: 0 min  Stress: Stress Concern Present (12/26/2020)   [place]    Feeling of Stress : Very much  Social Connections: Socially Isolated (08/27/2021)   Social Connection and Isolation Panel [NHANES]    Frequency of Communication with Friends and Family: More than three times a week    Frequency of Social Gatherings with Friends and Family: Never    Attends Religious Services: Never    Marine scientist or Organizations: No    Attends Archivist Meetings: Never    Marital Status: Widowed    Tobacco Counseling Ready to quit: Not Answered Counseling given: Not Answered Tobacco comments: down to 0.5ppd 06/30/20/jm   Clinical Intake:     Pain : 0-10 Pain Score: 10-Worst pain ever Pain Type: Chronic pain Pain Location: Neck Pain Orientation: Right Pain Radiating Towards: neck and shoulder Pain Descriptors / Indicators: Constant, Sharp Pain Onset: More than a month ago Pain Frequency: Constant     Diabetes: Yes CBG  done?: No CBG resulted in Enter/ Edit results?: No Did pt. bring in CBG monitor from home?: No  How often do you need to have someone help you when you read instructions, pamphlets, or other written materials from your doctor or pharmacy?: 1 - Never  Diabetic?yes   Interpreter Needed?: No      Activities of Daily Living    08/27/2021    1:49 PM 08/26/2021   11:52 PM  In your present state of health, do you have any difficulty performing the following activities:  Hearing? 1 0  Comment Sometimes   Vision? 0 1  Difficulty concentrating or making decisions? 1 1  Walking or climbing stairs? 0 1  Dressing or bathing? 0 1  Doing errands, shopping? 0 0  Preparing Food and eating ?  Y  Using the Toilet?  N  In the past six months, have you accidently leaked urine?  N  Do you have problems with loss of bowel control?  N  Managing your Medications?  N  Managing your Finances?  N  Housekeeping or managing your Housekeeping?  Y    Patient Care Team: [name], MD as PCP - General (Internal Medicine) Lorretta Harp, MD as PCP - Cardiology (Cardiology) Thornton Park,  MD as Consulting Physician (Gastroenterology) Gevena Cotton, MD as Consulting Physician (Ophthalmology) Paralee Cancel, MD as Consulting Physician (Orthopedic Surgery) Knox Royalty, RN as Case Manager [place], Darnelle Maffucci, [facility] (Inactive) (Pharmacist)  Indicate any recent Medical Services you may have received from other than [facility] providers in the past year (date may be approximate).     Assessment:   This is a routine wellness examination for Joan [name].  Hearing/Vision screen No results found.  Dietary issues and exercise activities discussed:     Goals Addressed   None   Depression Screen    08/27/2021    1:42 PM 05/04/2021    1:15 PM 03/27/2021    2:34 PM 01/31/2021    3:30 PM 12/19/2020   10:57 AM 09/19/2020    1:59 PM 05/31/2020   10:01 AM  PHQ 2/9 Scores  PHQ - 2 Score 0 1 0 0 _0 PHQ-  9 Score   0  2 7     Fall Risk    08/27/2021    1:48 PM 08/26/2021   11:52 PM 06/13/2021    3:00 PM 05/04/2021    1:15 PM 01/31/2021    3:30 PM  [place] in the past year? 1 0 1 1 0  Comment   denies new/ recent falls since "last fall" continues using walker as indicated denies new/ recent falls since last outreach 01/31/21; reports continues using walker   Number falls in past yr: 0 0 1 1 0  Injury with Fall? 1 0 0 0 0  Comment     N/A- no new/ recent falls reported  Risk for fall due to :   Medication side effect;Impaired balance/gait;Impaired mobility;Other (Comment) Impaired mobility;Medication side effect;Impaired balance/gait;History of fall(s) Impaired balance/gait;Medication side effect;Impaired mobility  Risk for fall due to: Comment   continuous O2    Follow up   Falls prevention discussed Falls prevention discussed Falls prevention discussed  Comment     uses cane and walker occasionally    FALL RISK PREVENTION PERTAINING TO THE HOME:  Any stairs in or around the home? No  If so, are there any without handrails? No  Home free of loose throw rugs in walkways, pet beds, electrical cords, etc? Yes  Adequate lighting in your home to reduce risk of falls? Yes   ASSISTIVE DEVICES UTILIZED TO PREVENT FALLS:  Life alert? No  Use of a cane, walker or w/c? Yes  Grab bars in the bathroom? Yes  Shower chair or bench in shower? No  Elevated toilet seat or a handicapped toilet? Yes   TIMED UP AND GO:  Was the test performed? No .  Length of time to ambulate 10 feet: n/a sec.   Cognitive Function:    04/06/2018    3:04 PM  MMSE - Mini Mental State Exam  Orientation to time 5  Orientation to Place 5  Registration 3  Attention/ Calculation 4  Recall 3  Language- name 2 objects 2  Language- repeat 1  Language- follow 3 step command 3  Language- read & follow direction 1  Write a sentence 1  Copy design 1  Total score 29        08/27/2021    1:54 PM 05/24/2019     1:43 PM  6CIT Screen  What Year? 0 points 0 points  What month? 0 points 0 points  What time? 0 points 0 points  Count back from 20 0 points 0 points  Months in reverse  0 points  Repeat phrase 2 points 0 points  Total Score  0 points    Immunizations Immunization History  Administered Date(s) Administered   Influenza,inj,Quad PF,6+ Mos 11/25/2016, 11/11/2017, 11/09/2018, 10/28/2019, 03/15/2021   PFIZER(Purple Top)SARS-COV-2 Vaccination 06/04/2019, 06/28/2019   Pfizer Covid-19 Vaccine Bivalent Booster 82yr & up 04/05/2021   Tdap 12/17/2018    TDAP status: Up to date  Flu Vaccine status: Up to date  Pneumococcal vaccine status: Up to date  Covid-19 vaccine status: Completed vaccines  Qualifies for Shingles Vaccine? Yes   Zostavax completed No   Shingrix Completed?: No.    Education has been provided regarding the importance of this vaccine. Patient has been advised to call insurance company to determine out of pocket expense if they have not yet received this vaccine. Advised may also receive vaccine at local pharmacy or Health Dept. Verbalized acceptance and understanding.  Screening Tests Health Maintenance  Topic Date Due   OPHTHALMOLOGY EXAM  Never done   PAP SMEAR-Modifier  Never done   COVID-19 Vaccine (4 - Booster for Pfizer series) 05/31/2021   Zoster Vaccines- Shingrix (1 of 2) 11/27/2021 (Originally 11/01/1979)   HEMOGLOBIN A1C  09/11/2021   INFLUENZA VACCINE  09/25/2021   FOOT EXAM  03/27/2022   URINE MICROALBUMIN  03/27/2022   MAMMOGRAM  07/25/2023   COLONOSCOPY (Pts 45-415yrInsurance coverage will need to be confirmed)  11/23/2028   TETANUS/TDAP  12/16/2028   Hepatitis C Screening  Completed   HIV Screening  Completed   HPV VACCINES  Aged Out    Health Maintenance  Health Maintenance Due  Topic Date Due   OPHTHALMOLOGY EXAM  Never done   PAP SMEAR-Modifier  Never done   COVID-19 Vaccine (4 - Booster for PfFerrysburgeries) 05/31/2021    Colorectal  cancer screening: Type of screening: Colonoscopy. Completed 11/24/2018. Repeat every 10 years  Mammogram status: Completed 07/24/2021. Repeat every year 2 years   Lung Cancer Screening: (Low Dose CT Chest recommended if Age 60-80ears, 30 pack-year currently smoking OR have quit w/in 15years.) does qualify.   Lung Cancer Screening Referral: placed ordered   Additional Screening:  Hepatitis C Screening: does qualify; Completed 03/07/2020  Vision Screening: Recommended annual ophthalmology exams for early detection of glaucoma and other disorders of the eye. Is the patient up to date with their annual eye exam?  Yes  Who is the provider or what is the name of the office in which the patient attends annual eye exams?  If pt is not established with a provider, would they like to be referred to a provider to establish care? No .   Dental Screening: Recommended annual dental exams for proper oral hygiene  Community Resource Referral / Chronic Care Management: CRR required this visit?  No   CCM required this visit?  No      Plan:     I have personally reviewed and noted the following in the patient's chart:   Medical and social history Use of alcohol, tobacco or illicit drugs  Current medications and supplements including opioid prescriptions.  Functional ability and status Nutritional status Physical activity Advanced directives List of other physicians Hospitalizations, surgeries, and ER visits in previous 12 months Vitals Screenings to include cognitive, depression, and falls Referrals and appointments  In addition, I have reviewed and discussed with patient certain preventive protocols, quality metrics, and best practice recommendations. A written personalized care plan for preventive services as well as general preventive health recommendations were  provided to patient.     Mccall, Will, [place]   08/27/2021  Non-face to face 60 minutes   Nurse Notes:  Joan [name]  , Thank you for taking time to come for your Medicare Wellness Visit. I appreciate your ongoing commitment to your health goals. Please review the following plan we discussed and let me know if I can assist you in the future.   These are the goals we discussed:  Goals      Manage My Medicine     Timeframe:  Long-Range Goal Priority:  Medium Start Date:   06/28/2021                          Expected End Date:  06/29/2022                     Follow Up Date 10/2021   - call for medicine refill 2 or 3 days before it runs out - call if I am sick and can't take my medicine - keep a list of all the medicines I take; vitamins and herbals too - learn to read medicine labels    Why is this important?   These steps will help you keep on track with your medicines.   Notes:      Patient Self-Care Activities     Timeframe:  Long-Range Goal Priority:  High Start Date:            05/04/21                 Expected End Date:        05/05/22               Patient Joan [name] will: Take medications as prescribed Attend all scheduled provider appointments Call pharmacy for medication refills Call provider office for new concerns or questions Try to follow heart healthy, low salt, low cholesterol, carbohydrate-modified, low sugar diet as much as you are able [place] job with not smoking:  You are doing GREAT! Keep up the very good work! Try to get some light activity on days the weather is nice  Follow rescue plan if symptoms of COPD/ breathing difficulty flare-up- if you have ongoing trouble with or worsening of your breathing, call your lung doctor right away Continue using your oxygen as prescribed Continue visiting regularly with your established counselor to keep your stress and anxiety/ depression under good control Keep up the great work not having any falls!  Continue using your walker as needed         This is a list of the screening recommended for you and due dates:  Health Maintenance  Topic  Date Due   Eye exam for diabetics  Never done   Pap Smear  Never done   COVID-19 Vaccine (4 - Booster for Pfizer series) 05/31/2021   Zoster (Shingles) Vaccine (1 of 2) 11/27/2021*   Hemoglobin A1C  09/11/2021   Flu Shot  09/25/2021   Complete foot exam   03/27/2022   Urine Protein Check  03/27/2022   Mammogram  07/25/2023   Colon Cancer Screening  11/23/2028   Tetanus Vaccine  12/16/2028   Hepatitis C Screening: USPSTF Recommendation to screen - Ages 18-79 yo.  Completed   HIV Screening  Completed   HPV Vaccine  Aged Out  *Topic was postponed. The date shown is not the original due date.

## 2021-08-27 NOTE — Patient Instructions (Signed)

## 2021-08-30 ENCOUNTER — Telehealth: Payer: Self-pay

## 2021-08-30 ENCOUNTER — Ambulatory Visit (HOSPITAL_COMMUNITY): Payer: Medicare Other | Admitting: Licensed Clinical Social Worker

## 2021-08-30 NOTE — Telephone Encounter (Signed)
Patient called stating she received a message from [facility], stating they need more information for the Flonase refill?. Patient wasn't sure what information they need.

## 2021-09-05 MED ORDER — FLUTICASONE PROPIONATE 50 MCG/ACT NA SUSP: 2.0000 | Freq: Every day | NASAL | 1 refills | Status: DC

## 2021-09-05 NOTE — Telephone Encounter (Signed)
Resent in the rx and corrected some of the rx

## 2021-09-05 NOTE — Addendum Note (Signed)
Addended by: Felipa Emory on: 09/05/2021 08:18 AM   Modules accepted: Orders

## 2021-09-06 ENCOUNTER — Ambulatory Visit (INDEPENDENT_AMBULATORY_CARE_PROVIDER_SITE_OTHER): Payer: Medicare Other | Admitting: Licensed Clinical Social Worker

## 2021-09-06 DIAGNOSIS — F0630 Mood disorder due to known physiological condition, unspecified: Secondary | ICD-10-CM | POA: Diagnosis not present

## 2021-09-06 DIAGNOSIS — F4310 Post-traumatic stress disorder, unspecified: Secondary | ICD-10-CM | POA: Diagnosis not present

## 2021-09-06 DIAGNOSIS — R443 Hallucinations, unspecified: Secondary | ICD-10-CM | POA: Diagnosis not present

## 2021-09-06 DIAGNOSIS — F411 Generalized anxiety disorder: Secondary | ICD-10-CM

## 2021-09-06 NOTE — Progress Notes (Signed)
Virtual Visit via Video Note  I connected with Joan [name] on 09/06/21 at  4:00 PM EDT by a video enabled telemedicine application and verified that I am speaking with the correct person using two identifiers.  Location: Patient: stationary car Provider: home office   I discussed the limitations of evaluation and management by telemedicine and the availability of in person appointments. The patient expressed understanding and agreed to proceed.   I discussed the assessment and treatment plan with the patient. The patient was provided an opportunity to ask questions and all were answered. The patient agreed with the plan and demonstrated an understanding of the instructions.   The patient was advised to call back or seek an in-person evaluation if the symptoms worsen or if the condition fails to improve as anticipated.  I provided 55 minutes of non-face-to-face time during this encounter.  THERAPIST PROGRESS NOTE  Session Time: 4:00 PM to 4:55 PM  Participation Level: Active  Behavioral Response: CasualAlertEuthymic  Type of Therapy: Individual Therapy  Treatment Goals addressed: Work on trauma, anxiety, depression, coping  ProgressTowards Goals: Progressing-patient actively working on coping in session processing feelings related to stressors and assess sessions helpful for coping  Interventions: Solution Focused, Strength-based, Supportive, and Other: coping  Summary: Joan [name] is a 61 y.o. female who presents with good and bad news. The place she lives sucks. Feel like back at Joan [name]'s even worse mentally messed her up can't think straight. At sister's house went to bathroom and try on a bra. Standing there asking what doing? what's next? Couldn't move, going around in circles trying to get out of bathroom that way since guy moved in messing with her head. Joan [name] started talking down to her when other roommate, Joan [name], came. Getting along fine with Joan [name]. He was  blocking the driveway. Still get along. He moved his car. Joan [name] started backing his truck up getting closer to her car. Couldn't get into car because he was taking the space needing the space for her walker. Took care of that took up the space she needed. Body doesn't feel right very messed up very sore. Now having parking wars at the house. Had it with him and told him mad need to "get off my car or call the landlady". He doesn't know how to park. He now parks out of the garage.  The good news though is she is moving and found a place. It is housing rent will go from $950 to $420. So ecstatic. Got [employer] so happy. Food stamps $27 a month which is not much so helps that all her money not going to rent. Hopes the move in won't cost her too much. No more big bills. Car paid for October 30. All will be is her bed and pay that off rent groceries and her. Also has to pay electric bill. Called Joan [name] before the knowledge of getting the apartment. Still wants to talk to him because having issues-how she is getting stuck in her head. Can't move don't know what doing next hang on to comes to her. Seeing bugs again and hearing voices. People at the house stirred things up was doing good when by herself. Mess with mind disorientation bad-gets stuck with conversation and what doing.  Therapist wanted to point out help patient make this happen and she says god helping for it to happen. Shouldn't be here when on drugs. Don't want to go through that again. Work on a couple of issues with  her neck. So bad can't move. Also got a shot in left arm a few months back when at the hospital boost and flu shots can't lift arm still. Thinks she injected through her bones. Has specialist for her neck. Tried Cortizone in back and spine and didn't work. Two of them said won't do surgery they don't think need it. Asked for another opinion and referred her to somebody else so if need surgery want to be settled first. Has lumbar stenosis in  spine and neck lower back had it and has two rods and screws. Slipped on ice up [place]. Was to finish the neck but the shots were painful. They didn't work too much pain. Didn't do surgery moved [place].  She has limited furniture but says take the time and get the furniture she wants. Live nice and quiet by herself. Closer to sister distance wise. Don't talk to Joan [name] or Joan [name]-a long time for Joan [name] and Joan [name] since she left. Sister Joan [name] gets along with her. Sister Joan [name] said ok to come down to Joan [name] and Joan [name] said didn't finish conversation about her coming and didn't want patient coming.  So they invited her to Joan [name] when she got there they said they did not want her some of the her past history there has been conflict.  Joan [name] will come see her.  Patient will show therapist house therapist said will be fun to enjoy that and if she starts to put things together to be along with her to enjoy that as well  Therapist reviewed symptoms facilitated expression of thoughts and feelings reviewed stressors currently where she is living assess helpful to have an outlet to talk about it noted living environment has significant impact on her mental health, her wellbeing should not describe ways that she gets stuck confused thinks it is related to negative environment where she is living.  Positive news that she is moving to a place that is a lot cheaper therapist celebrated with patient the good news that she really needed this decrease to be able to take care of her bills given her salary.  Noted the other positive that she will be able to buy things for herself now.  It will be her own place and she can fix it up the way she wants.  Therapist noted patient did this for herself noting her self-efficacy and patient noted her coping comes through spirituality and therapist agreed with her on this the helpfulness the support we get through our spirituality.  This provided space and support for patient to talk about  thoughts and feelings in session. Suicidal/Homicidal: No  Plan: Return again in 2 weeks.2.Work on trauma stress anxiety depression  Diagnosis:  PTSD, hallucinations, GAD, Mood Disorder in conditions classified elsewhere   Collaboration of Care: Other none needed  Patient/Guardian was advised Release of Information must be obtained prior to any record release in order to collaborate their care with an outside provider. Patient/Guardian was advised if they have not already done so to contact the registration department to sign all necessary forms in order for [place] to release information regarding their care.   Consent: Patient/Guardian gives verbal consent for treatment and assignment of benefits for services provided during this visit. Patient/Guardian expressed understanding and agreed to proceed.   [name], LCSW 09/06/2021

## 2021-09-07 ENCOUNTER — Encounter (HOSPITAL_COMMUNITY): Payer: Self-pay

## 2021-09-07 ENCOUNTER — Ambulatory Visit: Payer: Medicare Other | Admitting: Podiatry

## 2021-09-07 ENCOUNTER — Telehealth (HOSPITAL_COMMUNITY): Payer: Medicare Other | Admitting: Psychiatry

## 2021-09-07 DIAGNOSIS — L03031 Cellulitis of right toe: Secondary | ICD-10-CM | POA: Diagnosis not present

## 2021-09-07 DIAGNOSIS — L02611 Cutaneous abscess of right foot: Secondary | ICD-10-CM | POA: Diagnosis not present

## 2021-09-10 ENCOUNTER — Telehealth: Payer: Self-pay

## 2021-09-10 MED ORDER — GLUCOSE BLOOD VI STRP: ORAL_STRIP | 12 refills | Status: DC

## 2021-09-10 NOTE — Telephone Encounter (Signed)
Refill has been sent to the pt's pharmacy  

## 2021-09-10 NOTE — Telephone Encounter (Signed)
Pt is requesting a refill on: glucose blood test strip  Pharmacy: [id number] [place] 08/14/21 ROV 12/11/21

## 2021-09-13 ENCOUNTER — Ambulatory Visit: Payer: Medicare Other | Admitting: Internal Medicine

## 2021-09-15 NOTE — Progress Notes (Deleted)
Cardiology Clinic Note   Patient Name: Joan [name] Date of Encounter: 09/15/2021  Primary Care Provider:  [name], MD Primary Cardiologist:  Quay Burow, MD  Patient Profile    Joan [name] 61 year old female presents to the clinic today for follow-up evaluation of her essential hypertension and carotid artery disease.  Past Medical History    Past Medical History:  Diagnosis Date   Acute respiratory failure with hypoxia ([place])    Alcoholism and drug addiction in family    Anxiety    Appendicitis    Arthritis    "knees, hips, lower back" (09/25/2016)   Asthma    Bipolar disorder ([place])    Chronic bronchitis ([place])    "all the time" (09/25/2016)   Chronic leg pain    RLE   Chronic lower back pain    COPD (chronic obstructive pulmonary disease) ([place])    Depression    Diabetes mellitus without complication (HCC)    Diabetic neuropathy ([place])    Early cataracts, bilateral    Emphysema of lung ([place])    Generalized anxiety disorder 02/09/2020   GERD (gastroesophageal reflux disease)    Headache    Hepatitis C    "treated back in 2001-2002"   High cholesterol    Hypertension    Hypothyroidism    Incarcerated ventral hernia 04/04/2017   Mood disorder in conditions classified elsewhere 02/09/2020   On home oxygen therapy    "2L; at nighttime" (04/04/2017)   OSA on CPAP    CPAP with Oxygen 2 liters   Paranoid ([place])    Pneumonia    "all the time" (09/25/2016)   Positive PPD    with negative chest x ray   PTSD (post-traumatic stress disorder) 02/09/2020   Tachycardia    patient reports with exertion ; denies any other conditions    Thyroid disease    Wears glasses    Wears partial dentures    Past Surgical History:  Procedure Laterality Date   BACK SURGERY     BIOPSY  11/24/2018   Procedure: BIOPSY;  Surgeon: Thornton Park, MD;  [facility];  Service: Gastroenterology;;   CARDIAC CATHETERIZATION     COLONOSCOPY      COLONOSCOPY WITH PROPOFOL N/A 11/24/2018   Procedure: COLONOSCOPY WITH PROPOFOL;  Surgeon: Thornton Park, MD;  [facility];  Service: Gastroenterology;  Laterality: N/A;   CYST EXCISION     removal of cyst in sinuses   DILATION AND CURETTAGE OF UTERUS     ESOPHAGOGASTRODUODENOSCOPY (EGD) WITH PROPOFOL N/A 11/24/2018   Procedure: ESOPHAGOGASTRODUODENOSCOPY (EGD) WITH PROPOFOL;  Surgeon: Thornton Park, MD;  [facility];  Service: Gastroenterology;  Laterality: N/A;   FOOT SURGERY Bilateral    bone removed from 4th and 5 th toes and put in pin then took pin out   [place]  ~ 2014/2015   "removed mesh & infection"   [place] N/A 04/04/2017   Procedure: LAPAROSCOPIC INCISIONAL HERNIA REPAIR WITH MESH;  Surgeon: Fanny Skates, MD;  Location: [place];  Service: General;  Laterality: N/A;   LACERATION REPAIR Right    repair wrist artery from laceration from ice skate   LAPAROSCOPIC APPENDECTOMY N/A 03/31/2019   Procedure: APPENDECTOMY LAPAROSCOPIC;  Surgeon: Ralene Ok, MD;  [facility];  Service: General;  Laterality: N/A;   LAPAROSCOPIC APPENDECTOMY N/A 07/09/2019   Procedure: APPENDECTOMY LAPAROSCOPIC;  Surgeon: Ralene Ok, MD;  Location: [place];  Service: General;  Laterality: N/A;   LAPAROSCOPIC INCISIONAL / UMBILICAL / VENTRAL HERNIA REPAIR  04/04/2017   LAPAROSCOPIC INCISIONAL HERNIA REPAIR WITH MESH   LIPOMA EXCISION Right    fattty lipoma in neck   NASAL SEPTUM SURGERY     POSTERIOR LUMBAR FUSION  2015/2016 X 2   "added rods and screws"   SINOSCOPY     TOTAL HIP ARTHROPLASTY Right 02/05/2017   Procedure: RIGHT TOTAL HIP ARTHROPLASTY;  Surgeon: [name], MD;  [facility];  Service: Orthopedics;  Laterality: Right;   TOTAL HIP ARTHROPLASTY Left 06/18/2017   Procedure: LEFT TOTAL HIP ARTHROPLASTY;  Surgeon: [name], MD;  [facility];  Service: Orthopedics;  Laterality: Left;    TOTAL HIP REVISION Left 01/26/2019   Procedure: TOTAL HIP REVISION;  Surgeon: Paralee Cancel, MD;  [facility];  Service: Orthopedics;  Laterality: Left;  2 hrs   TUBAL LIGATION     UMBILICAL HERNIA REPAIR  ~ 2013   w/mesh   UPPER GI ENDOSCOPY      Allergies  Allergies  Allergen Reactions   Keflex [Cephalexin] Rash and Other (See Comments)    Has tolerated amoxicillin since had keflex   Shellfish Allergy Shortness Of Breath, Nausea And Vomiting and Other (See Comments)    Stomach cramps   Tetracyclines & Related Anaphylaxis, Swelling and Other (See Comments)    Throat swelling requiring hospitalization   Dilaudid [Hydromorphone Hcl] Other (See Comments)    "Lethargy"   Prednisone Other (See Comments)    "counter reacts", has tolerated IV medrol   Clindamycin/Lincomycin Other (See Comments)    Burning sensation in her mouth down her throat    Incruse Ellipta [Umeclidinium Bromide] Nausea Only   Other    Tuberculin Tests Other (See Comments)    False postive    History of Present Illness    Joan [name] has a PMH of HTN, carotid artery disease, OSA on CPAP, smoker's cough, allergic rhinitis, COPD, hyperlipidemia, diabetes mellitus type 2, anxiety, PTSD, generalized anxiety disorder, hypokalemia, and atypical chest pain.  She also was noted to have chronic shortness of breath that is felt to be related to COPD and ongoing tobacco abuse.  She was seen by Dr. Gwenlyn Found on 04/17/2021 for follow-up evaluation of her chest pressure that had been present for several months.  She also reported that she had been told she had 50% right carotid artery stenosis via ultrasound.  During that time she had been hospitalized earlier in the month for COPD exacerbation.  She reported that she had recently quit smoking.  Her echocardiogram 08/11/2020 was relatively normal.  She had a chest CT 03/08/2020 which showed coronary calcification.  She had a negative Myoview 08/19/2017.  She was felt to have  atypical chest discomfort.  She was oxygen dependent and was using a wheelchair.  She was unable to ambulate due to her severe pulmonary disease.  She presents to the clinic today for follow-up evaluation states***  *** denies chest pain, shortness of breath, lower extremity edema, fatigue, palpitations, melena, hematuria, hemoptysis, diaphoresis, weakness, presyncope, syncope, orthopnea, and PND.  Essential hypertension-BP today*** Continue atenolol, hydrochlorothiazide Heart healthy low-sodium diet-salty 6 given Increase physical activity as tolerated  Hyperlipidemia-LDL*** Continue atorvastatin Heart healthy low-sodium diet-salty 6 given Increase physical activity as tolerated  Atypical chest pain-denies recent episodes of chest discomfort.  Had nuclear stress test 08/19/2017 and was negative for ischemia. Continue atenolol Heart healthy low-sodium diet-salty 6 given Increase physical activity as tolerated  Emphysema-continues to be oxygen dependent on ***liters nasal cannula.  Saturating in the***.  Continues to use WC for transport due to inability to ambulate with pulmonary disease. Continue current medical therapy Follows with PCP  Disposition: Follow-up with Dr. Gwenlyn Found in 9-12 months.       Home Medications    Prior to Admission medications   Medication Sig Start Date End Date Taking? Authorizing Provider  Accu-Chek Softclix Lancets lancets Use as instructed 05/21/21   [name], MD  acyclovir (ZOVIRAX) 400 MG tablet TAKE 1 TABLET BY MOUTH  TWICE DAILY 06/18/21   [name], MD  albuterol (PROVENTIL) (2.5 MG/3ML) 0.083% nebulizer solution Take 3 mLs (2.5 mg total) by nebulization 2 (two) times daily. And every 6 hours as needed.  DX: J44.9 01/05/21   [name], NP  atenolol (TENORMIN) 25 MG tablet Take 25 mg by mouth daily. 09/10/20   [provider]  atorvastatin (LIPITOR) 20 MG tablet TAKE 1 TABLET BY MOUTH  DAILY 01/17/21   [name], MD  blood glucose meter kit and supplies Dispense based on patient and insurance preference. Use up to two times daily as directed. (FOR ICD-10 E10.9, E11.9). 04/11/21   [name], MD  Budeson-Glycopyrrol-Formoterol (BREZTRI AEROSPHERE) 160-9-4.8 MCG/ACT AERO Inhale 2 puffs into the lungs in the morning and at bedtime. 05/08/21   [name], MD  buPROPion (WELLBUTRIN XL) 150 MG 24 hr tablet TAKE 1 TABLET BY MOUTH  DAILY 01/17/21   [name], MD  busPIRone (BUSPAR) 5 MG tablet TAKE 1/2 TO 1 TABLET BY MOUTH  TWICE DAILY AS NEEDED 08/23/21   [name], MD  Camphor-Menthol-Methyl Sal (SALONPAS) 3.03-02-08 % PTCH Apply 1 patch topically daily as needed.    [provider]  cetirizine (ZYRTEC) 10 MG tablet Take 1 tablet (10 mg total) by mouth 2 (two) times daily. 08/09/21 11/07/21  Valentina Shaggy, MD  clindamycin (CLEOCIN) 300 MG capsule Take 1 capsule (300 mg total) by mouth 3 (three) times daily. Patient not taking: Reported on 08/27/2021 08/10/21   Edrick Kins, DPM  cyclobenzaprine (FLEXERIL) 10 MG tablet Take 1 tablet (10 mg total) by mouth at bedtime. 06/12/21   [name], MD  fluticasone Asencion Islam) 50 MCG/ACT nasal spray Place 2 sprays into both nostrils daily. 09/05/21 12/04/21  Valentina Shaggy, MD  glucose blood test strip Use as instructed 09/10/21   [name], MD  hydrochlorothiazide (HYDRODIURIL) 25 MG tablet TAKE 1 TABLET BY MOUTH  DAILY 01/17/21   [name], MD  Ipratropium-Albuterol (COMBIVENT RESPIMAT) 20-100 MCG/ACT AERS respimat Inhale 1 puff into the lungs every 6 (six) hours as needed for wheezing or shortness of breath. 03/02/21   [name], MD  levofloxacin (LEVAQUIN) 750 MG tablet Take 1 tablet (750 mg total) by mouth daily. 08/15/21   Edrick Kins, DPM  levothyroxine (SYNTHROID) 25 MCG tablet TAKE 1 TABLET BY MOUTH  DAILY BEFORE BREAKFAST 01/17/21   [name], MD  Liniments [facility]  ARTHRITIS PAIN RELIEF EX) Apply 1 patch topically daily as needed (to painful sites- remove as directed).     [provider]  montelukast (SINGULAIR) 10 MG tablet TAKE 1 TABLET BY MOUTH AT  BEDTIME 01/17/21   [name], MD  nystatin-triamcinolone ointment [facility]) Apply 1 application topically 2 (two) times daily as needed (applied to skin folds/groins/under breast skin irritation rash.). 09/08/17   [name], MD  OXYGEN Inhale 3  L/min into the lungs See admin instructions. Inhale  3 L/min of oxygen into the lungs w/CPAP at bedtime and as needed for shortness of breath with "strenuous activity" during the day    [provider]  pantoprazole (PROTONIX) 40 MG tablet TAKE 1 TABLET BY MOUTH  DAILY 01/17/21   [name], MD  PATADAY 0.2 % SOLN Apply 1 drop to eye daily. 08/20/19   [provider]  polyethylene glycol (MIRALAX / GLYCOLAX) 17 g packet Take 17 g by mouth daily as needed for mild constipation. 04/14/19   Meuth, Brooke A, PA-C  pregabalin (LYRICA) 150 MG capsule Take 1 capsule (150 mg total) by mouth 2 (two) times daily. 08/14/21   [name], MD  QUEtiapine (SEROQUEL) 50 MG tablet TAKE 1 TABLET BY MOUTH AT  BEDTIME 01/17/21   [name], MD  roflumilast (DALIRESP) 500 MCG TABS tablet Take 1 tablet (500 mcg total) by mouth daily. 05/08/21   [name], MD  silver sulfADIAZINE (SILVADENE) 1 % cream Apply 1 Application topically daily. 08/10/21   Edrick Kins, DPM  traZODone (DESYREL) 100 MG tablet TAKE 1 TABLET BY MOUTH AT  BEDTIME 01/17/21   [name], MD  venlafaxine XR (EFFEXOR-XR) 150 MG 24 hr capsule TAKE 1 CAPSULE BY MOUTH  DAILY WITH BREAKFAST 01/17/21   [name], MD  witch hazel-glycerin (TUCKS) pad Apply topically as needed for itching. 08/18/20   Samuella Cota, MD    Family History    Family History  Problem Relation Age of Onset   Diabetes Mother    Hypertension Mother     Hypertension Father    Cancer Father        lung   Breast cancer Neg Hx    She indicated that her mother is deceased. She indicated that her father is deceased. She indicated that the status of her neg hx is unknown.  Social History    Social History   Socioeconomic History   Marital status: Widowed    Spouse name: Not on file   Number of children: 3   Years of education: Not on file   Highest education level: Not on file  Occupational History   Not on file  Tobacco Use   Smoking status: Every Day    Packs/day: 0.50    Years: 44.00    Total pack years: 22.00    Types: Cigarettes    Passive exposure: Current   Smokeless tobacco: Never   Tobacco comments:    down to 0.5ppd 06/30/20/jm  Vaping Use   Vaping Use: Never used  Substance and Sexual Activity   Alcohol use: Not Currently   Drug use: Not Currently    Types: "Crack" cocaine    Comment:  "nothing since 01/12/2012"   Sexual activity: Not Currently    Partners: Male  Other Topics Concern   Not on file  Social History Narrative   Not on file   Social Determinants of Health   Financial Resource Strain: High Risk (05/05/2020)   Overall Financial Resource Strain (CARDIA)    Difficulty of Paying Living Expenses: Hard  Food Insecurity: Food Insecurity Present (08/27/2021)   Hunger Vital Sign    Worried About Running Out of Food in the Last Year: Sometimes true    Ran Out of Food in the Last Year: Never true  Transportation Needs: No Transportation Needs (08/27/2021)   PRAPARE - Hydrologist (Medical): No  Lack of Transportation (Non-Medical): No  Physical Activity: Inactive (08/27/2021)   Exercise Vital Sign    Days of Exercise per Week: 0 days    Minutes of Exercise per Session: 0 min  Stress: Stress Concern Present (12/26/2020)   [place]    Feeling of Stress : Very much  Social Connections: Socially Isolated (08/27/2021)    Social Connection and Isolation Panel [NHANES]    Frequency of Communication with Friends and Family: More than three times a week    Frequency of Social Gatherings with Friends and Family: Never    Attends Religious Services: Never    Marine scientist or Organizations: No    Attends Archivist Meetings: Never    Marital Status: Widowed  Intimate Partner Violence: Not At Risk (08/27/2021)   Humiliation, Afraid, Rape, and Kick questionnaire    Fear of Current or Ex-Partner: No    Emotionally Abused: No    Physically Abused: No    Sexually Abused: No     Review of Systems    General:  No chills, fever, night sweats or weight changes.  Cardiovascular:  No chest pain, dyspnea on exertion, edema, orthopnea, palpitations, paroxysmal nocturnal dyspnea. Dermatological: No rash, lesions/masses Respiratory: No cough, dyspnea Urologic: No hematuria, dysuria Abdominal:   No nausea, vomiting, diarrhea, bright red blood per rectum, melena, or hematemesis Neurologic:  No visual changes, wkns, changes in mental status. All other systems reviewed and are otherwise negative except as noted above.  Physical Exam    VS:  There were no vitals taken for this visit. , BMI There is no height or weight on file to calculate BMI. GEN: Well nourished, well developed, in no acute distress. HEENT: normal. Neck: Supple, no JVD, carotid bruits, or masses. Cardiac: RRR, no murmurs, rubs, or gallops. No clubbing, cyanosis, edema.  Radials/DP/PT 2+ and equal bilaterally.  Respiratory:  Respirations regular and unlabored, clear to auscultation bilaterally. GI: Soft, nontender, nondistended, BS + x 4. MS: no deformity or atrophy. Skin: warm and dry, no rash. Neuro:  Strength and sensation are intact. Psych: Normal affect.  Accessory Clinical Findings    Recent Labs: 03/13/2021: B Natriuretic Peptide 55.2 04/05/2021: Magnesium 2.0 06/11/2021: ALT 28; BUN 9; Creatinine, Ser 1.04; Hemoglobin 12.0;  Platelets 261.0; Potassium 4.0; Sodium 137; TSH 0.93   Recent Lipid Panel    Component Value Date/Time   CHOL 140 06/11/2021 1505   CHOL 134 08/19/2017 0822   TRIG 98.0 06/11/2021 1505   HDL 51.10 06/11/2021 1505   HDL 43 08/19/2017 0822   CHOLHDL 3 06/11/2021 1505   VLDL 19.6 06/11/2021 1505   LDLCALC 69 06/11/2021 1505   LDLCALC 62 08/19/2017 0822   LDLDIRECT 56.0 12/17/2018 1402    ECG personally reviewed by me today- *** - No acute changes  Nuclear stress test 08/19/2017  Nuclear stress EF: 60%. Mild inferoseptal wall hypokinesis There was no ST segment deviation noted during stress. This is a low risk study. No ischemia identified. The study is normal.   Candee Furbish, MD  Echocardiogram 08/11/2020  IMPRESSIONS     1. Left ventricular ejection fraction, by estimation, is 60 to 65%. The  left ventricle has normal function. The left ventricle has no regional  wall motion abnormalities. Left ventricular diastolic parameters were  normal.   2. Right ventricular systolic function is normal. The right ventricular  size is normal.   3. The mitral valve is grossly normal. No  evidence of mitral valve  regurgitation.   4. The aortic valve is grossly normal. Aortic valve regurgitation is not  visualized. No aortic stenosis is present.   Assessment & Plan   1.  ***   Jossie Ng. Jadea Shiffer NP-C     09/15/2021, 12:24 PM [facility] [address] Office [phone] Fax [phone]  Notice: This dictation was prepared with Dragon dictation along with smaller phrase technology. Any transcriptional errors that result from this process are unintentional and may not be corrected upon review.  I spent***minutes examining this patient, reviewing medications, and using patient centered shared decision making involving her cardiac care.  Prior to her visit I spent greater than 20 minutes reviewing her past medical history,  medications, and  prior cardiac tests.

## 2021-09-17 ENCOUNTER — Telehealth: Payer: Self-pay | Admitting: Cardiovascular Disease

## 2021-09-17 NOTE — Telephone Encounter (Signed)
Pt called stating she is moving and couldn't make her appt tomorrow 09/18/21. She states that she does not want to drive all the way and would like a televisit with APP instead.

## 2021-09-17 NOTE — Telephone Encounter (Signed)
Attempted to call provided number. Phone rang several times, then received a message "call can not be completed at this time."  Unable to leave a message for the pt.

## 2021-09-18 ENCOUNTER — Ambulatory Visit: Payer: Medicare Other | Admitting: Internal Medicine

## 2021-09-18 ENCOUNTER — Ambulatory Visit: Payer: Medicare Other | Admitting: General Practice

## 2021-09-20 ENCOUNTER — Ambulatory Visit (HOSPITAL_COMMUNITY): Payer: Medicare Other | Admitting: Licensed Clinical Social Worker

## 2021-09-20 ENCOUNTER — Telehealth: Payer: Self-pay | Admitting: Internal Medicine

## 2021-09-20 NOTE — Progress Notes (Signed)
No chief complaint on file.   HPI: 61 y.o. female presenting today for follow-up evaluation of blisters that developed to the patient's bilateral feet.  Last visit on 08/10/2021 clindamycin was prescribed.  She completed the clindamycin and has been applying Silvadene cream to the blister lesions with a light Band-Aid.  She says that the blisters have resolved.  She presents for follow-up treatment and evaluation  Past Medical History:  Diagnosis Date   Acute respiratory failure with hypoxia ([place])    Alcoholism and drug addiction in family    Anxiety    Appendicitis    Arthritis    "knees, hips, lower back" (09/25/2016)   Asthma    Bipolar disorder ([place])    Chronic bronchitis ([place])    "all the time" (09/25/2016)   Chronic leg pain    RLE   Chronic lower back pain    COPD (chronic obstructive pulmonary disease) ([place])    Depression    Diabetes mellitus without complication (HCC)    Diabetic neuropathy ([place])    Early cataracts, bilateral    Emphysema of lung ([place])    Generalized anxiety disorder 02/09/2020   GERD (gastroesophageal reflux disease)    Headache    Hepatitis C    "treated back in 2001-2002"   High cholesterol    Hypertension    Hypothyroidism    Incarcerated ventral hernia 04/04/2017   Mood disorder in conditions classified elsewhere 02/09/2020   On home oxygen therapy    "2L; at nighttime" (04/04/2017)   OSA on CPAP    CPAP with Oxygen 2 liters   Paranoid ([place])    Pneumonia    "all the time" (09/25/2016)   Positive PPD    with negative chest x ray   PTSD (post-traumatic stress disorder) 02/09/2020   Tachycardia    patient reports with exertion ; denies any other conditions    Thyroid disease    Wears glasses    Wears partial dentures     Past Surgical History:  Procedure Laterality Date   BACK SURGERY     BIOPSY  11/24/2018   Procedure: BIOPSY;  Surgeon: Thornton Park, MD;  [facility];  Service: Gastroenterology;;   CARDIAC CATHETERIZATION      COLONOSCOPY     COLONOSCOPY WITH PROPOFOL N/A 11/24/2018   Procedure: COLONOSCOPY WITH PROPOFOL;  Surgeon: Thornton Park, MD;  [facility];  Service: Gastroenterology;  Laterality: N/A;   CYST EXCISION     removal of cyst in sinuses   DILATION AND CURETTAGE OF UTERUS     ESOPHAGOGASTRODUODENOSCOPY (EGD) WITH PROPOFOL N/A 11/24/2018   Procedure: ESOPHAGOGASTRODUODENOSCOPY (EGD) WITH PROPOFOL;  Surgeon: Thornton Park, MD;  [facility];  Service: Gastroenterology;  Laterality: N/A;   FOOT SURGERY Bilateral    bone removed from 4th and 5 th toes and put in pin then took pin out   [place]  ~ 2014/2015   "removed mesh & infection"   [place] N/A 04/04/2017   Procedure: LAPAROSCOPIC INCISIONAL HERNIA REPAIR WITH MESH;  Surgeon: Fanny Skates, MD;  Location: [place];  Service: General;  Laterality: N/A;   LACERATION REPAIR Right    repair wrist artery from laceration from ice skate   LAPAROSCOPIC APPENDECTOMY N/A 03/31/2019   Procedure: APPENDECTOMY LAPAROSCOPIC;  Surgeon: Ralene Ok, MD;  [facility];  Service: General;  Laterality: N/A;   LAPAROSCOPIC APPENDECTOMY N/A 07/09/2019   Procedure: APPENDECTOMY LAPAROSCOPIC;  Surgeon: Ralene Ok,  MD;  Location: [place];  Service: General;  Laterality: N/A;   LAPAROSCOPIC INCISIONAL / UMBILICAL / VENTRAL HERNIA REPAIR  04/04/2017   LAPAROSCOPIC INCISIONAL HERNIA REPAIR WITH MESH   LIPOMA EXCISION Right    fattty lipoma in neck   NASAL SEPTUM SURGERY     POSTERIOR LUMBAR FUSION  2015/2016 X 2   "added rods and screws"   SINOSCOPY     TOTAL HIP ARTHROPLASTY Right 02/05/2017   Procedure: RIGHT TOTAL HIP ARTHROPLASTY;  Surgeon: [name], MD;  [facility];  Service: Orthopedics;  Laterality: Right;   TOTAL HIP ARTHROPLASTY Left 06/18/2017   Procedure: LEFT TOTAL HIP ARTHROPLASTY;  Surgeon: [name], MD;  [facility];  Service: Orthopedics;   Laterality: Left;   TOTAL HIP REVISION Left 01/26/2019   Procedure: TOTAL HIP REVISION;  Surgeon: Paralee Cancel, MD;  [facility];  Service: Orthopedics;  Laterality: Left;  2 hrs   TUBAL LIGATION     UMBILICAL HERNIA REPAIR  ~ 2013   w/mesh   UPPER GI ENDOSCOPY      Allergies  Allergen Reactions   Keflex [Cephalexin] Rash and Other (See Comments)    Has tolerated amoxicillin since had keflex   Shellfish Allergy Shortness Of Breath, Nausea And Vomiting and Other (See Comments)    Stomach cramps   Tetracyclines & Related Anaphylaxis, Swelling and Other (See Comments)    Throat swelling requiring hospitalization   Dilaudid [Hydromorphone Hcl] Other (See Comments)    "Lethargy"   Prednisone Other (See Comments)    "counter reacts", has tolerated IV medrol   Clindamycin/Lincomycin Other (See Comments)    Burning sensation in her mouth down her throat    Incruse Ellipta [Umeclidinium Bromide] Nausea Only   Other    Tuberculin Tests Other (See Comments)    False postive    Photo taken 08/10/2021  Physical Exam: General: The patient is alert and oriented x3 in no acute distress.  Dermatology: Absence of the bilateral great toenails noted with callus.  This is baseline.  Vascular: Erythema with edema noted to the bilateral great toes as well as the right second digit with a serous blister appears to have resolved.  Blisters are no longer present.  The skin appears healthy and viable with good healing of the blister lesions  VAS [place] ABI W/WO TBI 07/06/2021 Summary:  Right: Resting right ankle-brachial index is within normal range. No  evidence of significant right lower extremity arterial disease. The right  toe-brachial index is normal.   Left: Resting left ankle-brachial index is within normal range. No  evidence of significant left lower extremity arterial disease. The left  toe-brachial index is normal.   Neurological: Light touch and protective threshold grossly  intact  Musculoskeletal Exam: No pedal deformities noted  Assessment: 1.  Erythema with edema bilateral great toes and right second digit; resolved 2.  Blister right second digit; resolved   Plan of Care:  1. Patient evaluated.  2.  Patient continues to experience some pain and sensitivity to the feet.  Recommend topical Voltaren gel as needed 3.  Continue Lyrica as prescribed from PCP 4.  Continue Silvadene cream as needed for the toes 5.  Return to clinic as needed       Edrick Kins, DPM [facility]  Dr. Edrick Kins, DPM    [address]  [place], [place] [address]                Office [phone]  Fax [phone]

## 2021-09-20 NOTE — Telephone Encounter (Signed)
Peter Garter from the housing authority will be faxing over verification of disability forms for Joan [name]. She is requesting that the forms be filled out today if possible. Tine must have the formed turned in today or she will lose her housing. She stated Cynithia was supposed had gotten the paperwork and had it filled out weeks ago but Querida never brought the paperwork by the office.    Fyi

## 2021-09-21 ENCOUNTER — Telehealth (HOSPITAL_COMMUNITY): Payer: Medicare Other | Admitting: Psychiatry

## 2021-09-21 ENCOUNTER — Ambulatory Visit: Payer: Medicare Other | Admitting: Internal Medicine

## 2021-09-21 NOTE — Telephone Encounter (Signed)
Unable to reach pt or leave a message  

## 2021-09-24 ENCOUNTER — Telehealth: Payer: Medicare Other

## 2021-09-24 ENCOUNTER — Telehealth: Payer: Self-pay | Admitting: *Deleted

## 2021-09-24 ENCOUNTER — Encounter: Payer: Self-pay | Admitting: *Deleted

## 2021-09-24 NOTE — Telephone Encounter (Signed)
  Care Management   Follow Up Note   09/24/2021 Name: Joan [name] MRN: [id number] DOB: 1960-11-02  Referred by: [name], MD Reason for referral : Care Coordination (Clinic RN CM Follow Up Telephone Outreach; COPD; HTN; unsuccessful outreach attempt # 1)  An unsuccessful telephone outreach was attempted today. The patient was referred to the case management team for assistance with care management and care coordination.   Follow Up Plan:  Unable to leave HIPPA compliant phone message for the patient providing contact information and requesting a return call-- received automated outgoing message stating, "your call cannot be completed at this time;" and phone spontaneously hung up without option to leave voice message Will re-attempt telephone outreach within 7 business days if I do not hear back from patient by end of day  Oneta Rack, RN, BSN, [place] [phone]: direct office

## 2021-09-25 ENCOUNTER — Encounter: Payer: Self-pay | Admitting: *Deleted

## 2021-09-25 ENCOUNTER — Telehealth: Payer: Self-pay | Admitting: *Deleted

## 2021-09-25 NOTE — Telephone Encounter (Signed)
  Care Management   Follow Up Note   09/25/2021 Name: Joan [name] MRN: [id number] DOB: October 11, 1960  Referred by: [name], MD Reason for referral : Care Coordination (Clinic RN CM Follow Up Outreach; COPD; HTN; second consecutive unsuccessful attempt)  A second unsuccessful telephone outreach was attempted today. The patient was referred to the case management team for assistance with care management and care coordination.   Received automated outgoing voice message stating, "your call cannot be completed at this time;" no option to leave patient voice message requesting call-back  Follow Up Plan:  Will re-attempt third/ final outreach for  RN CM follow up within 7 business days if I do not hear back from patient by end of day today  Oneta Rack, RN, BSN, [place] [phone]: direct office

## 2021-09-26 ENCOUNTER — Other Ambulatory Visit: Payer: Self-pay | Admitting: Internal Medicine

## 2021-09-26 NOTE — Telephone Encounter (Signed)
Unable to reach pt or leave a message  

## 2021-09-27 ENCOUNTER — Encounter: Payer: Self-pay | Admitting: *Deleted

## 2021-09-27 ENCOUNTER — Ambulatory Visit: Payer: Self-pay | Admitting: *Deleted

## 2021-09-27 DIAGNOSIS — J449 Chronic obstructive pulmonary disease, unspecified: Secondary | ICD-10-CM

## 2021-09-27 DIAGNOSIS — I10 Essential (primary) hypertension: Secondary | ICD-10-CM

## 2021-09-27 NOTE — Chronic Care Management (AMB) (Signed)
Care Management    RN Visit Note  09/27/2021 Name: Joan [name] MRN: [id number] DOB: 20-Nov-1960  Subjective: Joan [name] is a 61 y.o. year old female who is a primary care patient of [name], MD. The care management team was consulted for assistance with disease management and care coordination needs.    Unsuccessful consecutive third outreach attempt  for follow up visit/ RN CM case closure in response to provider referral for case management and/or care coordination services.   Consent to Services:   [name] was given information about Care Management services 06/02/19 including:  Care Management services includes personalized support from designated clinical staff supervised by her physician, including individualized plan of care and coordination with other care providers 24/7 contact phone numbers for assistance for urgent and routine care needs. The patient may stop case management services at any time by phone call to the office staff.  Patient agreed to services and consent obtained.   Assessment: Review of patient past medical history, allergies, medications, health status, including review of consultants reports, laboratory and other test data, was performed as part of comprehensive evaluation and provision of chronic care management services.  Care Plan  Allergies  Allergen Reactions   Keflex [Cephalexin] Rash and Other (See Comments)    Has tolerated amoxicillin since had keflex   Shellfish Allergy Shortness Of Breath, Nausea And Vomiting and Other (See Comments)    Stomach cramps   Tetracyclines & Related Anaphylaxis, Swelling and Other (See Comments)    Throat swelling requiring hospitalization   Dilaudid [Hydromorphone Hcl] Other (See Comments)    "Lethargy"   Prednisone Other (See Comments)    "counter reacts", has tolerated IV medrol   Clindamycin/Lincomycin Other (See Comments)    Burning sensation in her mouth down her throat     Incruse Ellipta [Umeclidinium Bromide] Nausea Only   Other    Tuberculin Tests Other (See Comments)    False postive   Outpatient Encounter Medications as of 09/27/2021  Medication Sig   Accu-Chek Softclix Lancets lancets Use as instructed   acyclovir (ZOVIRAX) 400 MG tablet TAKE 1 TABLET BY MOUTH  TWICE DAILY   albuterol (PROVENTIL) (2.5 MG/3ML) 0.083% nebulizer solution Take 3 mLs (2.5 mg total) by nebulization 2 (two) times daily. And every 6 hours as needed.  DX: J44.9   atenolol (TENORMIN) 25 MG tablet Take 25 mg by mouth daily.   atorvastatin (LIPITOR) 20 MG tablet TAKE 1 TABLET BY MOUTH  DAILY   blood glucose meter kit and supplies Dispense based on patient and insurance preference. Use up to two times daily as directed. (FOR ICD-10 E10.9, E11.9).   Budeson-Glycopyrrol-Formoterol (BREZTRI AEROSPHERE) 160-9-4.8 MCG/ACT AERO Inhale 2 puffs into the lungs in the morning and at bedtime.   buPROPion (WELLBUTRIN XL) 150 MG 24 hr tablet TAKE 1 TABLET BY MOUTH  DAILY   busPIRone (BUSPAR) 5 MG tablet TAKE 1/2 TO 1 TABLET BY MOUTH  TWICE DAILY AS NEEDED   Camphor-Menthol-Methyl Sal (SALONPAS) 3.03-02-08 % PTCH Apply 1 patch topically daily as needed.   cetirizine (ZYRTEC) 10 MG tablet Take 1 tablet (10 mg total) by mouth 2 (two) times daily.   clindamycin (CLEOCIN) 300 MG capsule Take 1 capsule (300 mg total) by mouth 3 (three) times daily. (Patient not taking: Reported on 08/27/2021)   cyclobenzaprine (FLEXERIL) 10 MG tablet Take 1 tablet (10 mg total) by mouth at bedtime.   fluticasone (FLONASE) 50 MCG/ACT nasal spray Place 2 sprays into  both nostrils daily.   glucose blood test strip Use as instructed   hydrochlorothiazide (HYDRODIURIL) 25 MG tablet TAKE 1 TABLET BY MOUTH  DAILY   Ipratropium-Albuterol (COMBIVENT RESPIMAT) 20-100 MCG/ACT AERS respimat Inhale 1 puff into the lungs every 6 (six) hours as needed for wheezing or shortness of breath.   levofloxacin (LEVAQUIN) 750 MG tablet Take 1 tablet  (750 mg total) by mouth daily.   levothyroxine (SYNTHROID) 25 MCG tablet TAKE 1 TABLET BY MOUTH  DAILY BEFORE BREAKFAST   Liniments (SALONPAS ARTHRITIS PAIN RELIEF EX) Apply 1 patch topically daily as needed (to painful sites- remove as directed).    montelukast (SINGULAIR) 10 MG tablet TAKE 1 TABLET BY MOUTH AT  BEDTIME   nystatin-triamcinolone ointment (MYCOLOG) Apply 1 application topically 2 (two) times daily as needed (applied to skin folds/groins/under breast skin irritation rash.).   OXYGEN Inhale 3 L/min into the lungs See admin instructions. Inhale  3 L/min of oxygen into the lungs w/CPAP at bedtime and as needed for shortness of breath with "strenuous activity" during the day   pantoprazole (PROTONIX) 40 MG tablet TAKE 1 TABLET BY MOUTH DAILY   PATADAY 0.2 % SOLN Apply 1 drop to eye daily.   polyethylene glycol (MIRALAX / GLYCOLAX) 17 g packet Take 17 g by mouth daily as needed for mild constipation.   pregabalin (LYRICA) 150 MG capsule Take 1 capsule (150 mg total) by mouth 2 (two) times daily.   QUEtiapine (SEROQUEL) 50 MG tablet TAKE 1 TABLET BY MOUTH AT  BEDTIME   roflumilast (DALIRESP) 500 MCG TABS tablet Take 1 tablet (500 mcg total) by mouth daily.   silver sulfADIAZINE (SILVADENE) 1 % cream Apply 1 Application topically daily.   traZODone (DESYREL) 100 MG tablet TAKE 1 TABLET BY MOUTH AT  BEDTIME   venlafaxine XR (EFFEXOR-XR) 150 MG 24 hr capsule TAKE 1 CAPSULE BY MOUTH  DAILY WITH BREAKFAST   witch hazel-glycerin (TUCKS) pad Apply topically as needed for itching.   No facility-administered encounter medications on file as of 09/27/2021.   Patient Active Problem List   Diagnosis Date Noted   Atypical chest pain 04/17/2021   Hypokalemia 04/03/2021   COPD (chronic obstructive pulmonary disease) with emphysema ([place]) 04/03/2021   COPD with acute exacerbation ([place]) 04/02/2021   Hypothyroidism    Diabetes mellitus, type II ([place])    Generalized abdominal pain 03/28/2021    Depression 08/16/2020   PTSD (post-traumatic stress disorder) 02/09/2020   Mood disorder in conditions classified elsewhere 02/09/2020   Generalized anxiety disorder 02/09/2020   Coarse tremors 10/28/2019   Chronic rhinitis 10/21/2019   Allergic rhinitis 06/16/2019   Cervical spondylosis without myelopathy 12/18/2018   Hypertension    Encounter for general adult medical examination with abnormal findings 11/06/2018   Smokers' cough ([place]) 01/15/2018   Pulmonary nodule 11/11/2017   Carotid artery disease ([place]) 11/05/2017   OSA on CPAP 08/07/2017   Gastroesophageal reflux disease 06/23/2017   Hypothyroidism due to acquired atrophy of thyroid 04/05/2017   Primary osteoarthritis involving multiple joints 04/05/2017   Chronic constipation 02/10/2017   Iron deficiency anemia due to dietary causes 02/10/2017   Osteoarthritis 02/10/2017   Hyperlipidemia associated with type 2 diabetes mellitus ([place]) 02/10/2017   Chronic respiratory failure with hypoxia ([place]) 12/16/2016   Essential hypertension 11/29/2016   Vocal cord dysfunction    Anxiety 09/24/2016   Conditions to be addressed/monitored: HTN and COPD  Care Plan : RN Care Manager Plan of Care  Updates made by Reginia Naas  M, RN since 09/27/2021 12:00 AM     Problem: Chronic Disease Management Needs   Priority: High     Long-Range Goal: Ongoing adherence to established plan of care for long term chronic disease management   Start Date: 05/04/2021  Expected End Date: 05/05/2022  Priority: High  Note:   Current Barriers:  Chronic Disease Management support and education needs related to HTN and COPD Ongoing mental health needs: patient currently well-established with psychiatric/ counseling providers 2 in-patient hospitalizations, within 30 days of one another, both for COPD exacerbation January 17-23, 2023: discharged to home with home health services in place through [facility] February 6-9, 2023: discharged to home/ self care with  ongoing home health services 06/13/21: Confirms previously reported housing issues are resolved: currently renting a room in a single family home; reports she is thinking about moving to [place] to be closer to her daughter Recent efforts to stop smoking in setting of being O2 dependent: patient reports has not smoked since April 16, 2021-- this is GREAT news  RNCM Clinical Goal(s):  Patient will demonstrate ongoing health management independence as evidenced by adherence of plan of care for self-health management of COPD and HTN        through collaboration with RN Care manager, provider, and care team.   Interventions: 1:1 collaboration with primary care provider regarding development and update of comprehensive plan of care as evidenced by provider attestation and co-signature Inter-disciplinary care team collaboration (see longitudinal plan of care) Evaluation of current treatment plan related to  self management and patient's adherence to plan as established by provider Review of patient status, including review of consultants reports, relevant laboratory and other test results, and medications completed Consecutive third unsuccessful outreach attempt; received automated outgoing voice message stating "your call can not be completed today; please try your call later;" no option to leave voice message requesting call back; call ended spontaneously; RN CM case closure accordingly  COPD/ HTN: (Status: 09/27/21:  RN CM case closure: unable to maintain contact with patient ) Long Term Goal  Last practice recorded BP readings:  BP Readings from Last 3 Encounters:  08/14/21 130/80  07/06/21 127/73  06/12/21 130/70      Plan:  No further follow up required: Third consecutive unsuccessful attempt; RN CM case closure accordingly  Oneta Rack, RN, BSN, [place] [phone]: direct office

## 2021-10-04 ENCOUNTER — Ambulatory Visit (INDEPENDENT_AMBULATORY_CARE_PROVIDER_SITE_OTHER): Payer: Medicare Other | Admitting: Licensed Clinical Social Worker

## 2021-10-04 DIAGNOSIS — F0630 Mood disorder due to known physiological condition, unspecified: Secondary | ICD-10-CM

## 2021-10-04 DIAGNOSIS — F4310 Post-traumatic stress disorder, unspecified: Secondary | ICD-10-CM

## 2021-10-04 DIAGNOSIS — F411 Generalized anxiety disorder: Secondary | ICD-10-CM | POA: Diagnosis not present

## 2021-10-04 DIAGNOSIS — R443 Hallucinations, unspecified: Secondary | ICD-10-CM

## 2021-10-04 NOTE — Progress Notes (Signed)
Virtual Visit via Video Note  I connected with [name] on 10/04/21 at  2:00 PM EDT by a video enabled telemedicine application and verified that I am speaking with the correct person using two identifiers.  Location: Patient: home Provider: home office   I discussed the limitations of evaluation and management by telemedicine and the availability of in person appointments. The patient expressed understanding and agreed to proceed.  I discussed the assessment and treatment plan with the patient. The patient was provided an opportunity to ask questions and all were answered. The patient agreed with the plan and demonstrated an understanding of the instructions.   The patient was advised to call back or seek an in-person evaluation if the symptoms worsen or if the condition fails to improve as anticipated.  I provided 52 minutes of non-face-to-face time during this encounter.  THERAPIST PROGRESS NOTE  Session Time: 2:00 PM to 2:52 PM  Participation Level: Active  Behavioral Response: CasualAlertEuthymic  Type of Therapy: Individual Therapy  Treatment Goals addressed: Work on trauma, anxiety, depression, coping  ProgressTowards Goals: Progressing  Interventions: Solution Focused, Strength-based, Supportive, and Other: coping  Summary: Joan [name] is a 61 y.o. female who presents with in a new place and loves it got her new apartment people nice and place nice. Space small but living in one bedroom small. $447.00 from $950. Moved in 27th of last month. Not lonely met people already. Have this one person a man constantly coming to see her. Two of them first guy Waunita Schooner talked for hours then met Peewee. They introduced to a woman [name] then met the woman across the way Joan [name] and son Magda Joan [name]. Met people right next door family not the mother lived there. She is 47 years old. Now in a nursing home can't walk anymore. Lived close to everything. As soon as moved in 1000 lbs  lifted off her patient explains. Not moving again. Embedded there. A little section she has where there is a roof  probably the size of a small office. You can put a table and chair out there not get wet because there is a the roof. All have a place like that. Health wise doing good. Got her motorized wheelchair up and running and able to use it there. Moved in with a bed medicine cabinet file cabinet and neighbors gave her a couch, dresser and end table. Love it gift form [name] especially [place] It seemed forever but patient really a couple of years in the waiting and finally here. Patient is more active. 100% better compared to what she was then. Talked about some experiences with neighbors. Can't let heart to be with someone. Think can let herself to love or have a companion. Promised husband would never be with anybody again until meet with him again. Fulfilling what he wants that she is happy. Die hope to be with him. Therapist asked once in a lifetime love with second husband Joan [name]. Issues but got through. Met him at [employer] he was a Barista.  First noticed each other inseparable he brought her home and haven't left each other side since. They had a wonderful relationship a fantasy relationship. He knew she was into drugs and he stuck with her didn't know he was into drugs found out later when got married. Liked to go back as a kid and what led to drugs and alcohol the "meat and the potatoes". Showed therapist her place and  therapist noted nice she has apartment now. Going to go back and do arts & crafts therapist noted this is a positive sign enthusiastic about this will help with mood.  Patient has moved into a new place and noted positive impact on patient's mood and health has encouraged her to do more activity what she will continue to help her to feel better.  Noted she has her own apartment significant improvement before.  Patient likes her apartment in the  neighborhood as noted nice to see her be here after seeing the struggle she has been through on the journey.  Touch base about memories about her husband noting positive memories noted patient becoming more stable so we can work on trauma work.  Therapist provided space and support for patient to talk about thoughts and feelings in session.  Suicidal/Homicidal: No  Plan: Return again in 2 weeks.2.  Start trauma work  Diagnosis:  PTSD, hallucinations, GAD, Mood Disorder in conditions classified elsewhere  Collaboration of Care: Other none needed  Patient/Guardian was advised Release of Information must be obtained prior to any record release in order to collaborate their care with an outside provider. Patient/Guardian was advised if they have not already done so to contact the registration department to sign all necessary forms in order for [place] to release information regarding their care.   Consent: Patient/Guardian gives verbal consent for treatment and assignment of benefits for services provided during this visit. Patient/Guardian expressed understanding and agreed to proceed.   [name], LCSW 10/04/2021

## 2021-10-12 ENCOUNTER — Ambulatory Visit (INDEPENDENT_AMBULATORY_CARE_PROVIDER_SITE_OTHER): Payer: Medicare Other | Admitting: Internal Medicine

## 2021-10-12 ENCOUNTER — Encounter: Payer: Self-pay | Admitting: Internal Medicine

## 2021-10-12 ENCOUNTER — Other Ambulatory Visit: Payer: Self-pay | Admitting: Internal Medicine

## 2021-10-12 DIAGNOSIS — E1140 Type 2 diabetes mellitus with diabetic neuropathy, unspecified: Secondary | ICD-10-CM | POA: Diagnosis not present

## 2021-10-12 DIAGNOSIS — J410 Simple chronic bronchitis: Secondary | ICD-10-CM

## 2021-10-12 DIAGNOSIS — F419 Anxiety disorder, unspecified: Secondary | ICD-10-CM | POA: Diagnosis not present

## 2021-10-12 MED ORDER — BUPROPION HCL ER (XL) 300 MG PO TB24: 300.0000 mg | ORAL_TABLET | Freq: Every day | ORAL | 1 refills | Status: DC

## 2021-10-12 MED ORDER — PREGABALIN 300 MG PO CAPS: 300.0000 mg | ORAL_CAPSULE | Freq: Two times a day (BID) | ORAL | 1 refills | Status: DC

## 2021-10-12 NOTE — Patient Instructions (Signed)
We will increase lyrica to 300 mg twice a day.  We will increase wellbutrin to 300 mg once a day.

## 2021-10-12 NOTE — Assessment & Plan Note (Signed)
She is having worsening problems. Requests xanax which is not prescribed. Increase the dose of wellbutrin to 300 mg daily and keep effexor at currently dosing. Has buspar for anxiety prn.

## 2021-10-12 NOTE — Assessment & Plan Note (Signed)
We will increase lyrica to 300 mg BID as she has had some benefit with increase to 150 mg BID. Follow up 2-3 months.

## 2021-10-12 NOTE — Progress Notes (Signed)
   Subjective:   Patient ID: Joan [name], female    DOB: December 13, 1960, 61 y.o.   MRN: [id number]  HPI The patient is here for concerns.  Review of Systems  Constitutional: Negative.   HENT: Negative.    Eyes: Negative.   Respiratory:  Positive for cough and shortness of breath. Negative for chest tightness.   Cardiovascular:  Negative for chest pain, palpitations and leg swelling.  Gastrointestinal:  Negative for abdominal distention, abdominal pain, constipation, diarrhea, nausea and vomiting.  Musculoskeletal:  Positive for arthralgias, back pain, myalgias, neck pain and neck stiffness.  Skin: Negative.   Neurological: Negative.   Psychiatric/Behavioral: Negative.      Objective:  Physical Exam Constitutional:      Appearance: She is well-developed.  HENT:     Head: Normocephalic and atraumatic.  Cardiovascular:     Rate and Rhythm: Normal rate and regular rhythm.  Pulmonary:     Effort: Pulmonary effort is normal. No respiratory distress.     Breath sounds: Wheezing present. No rales.  Abdominal:     General: Bowel sounds are normal. There is no distension.     Palpations: Abdomen is soft.     Tenderness: There is no abdominal tenderness. There is no rebound.  Musculoskeletal:     Cervical back: Normal range of motion.  Skin:    General: Skin is warm and dry.  Neurological:     Mental Status: She is alert and oriented to person, place, and time.     Coordination: Coordination normal.     Vitals:   10/12/21 1433  BP: 130/60  Pulse: 77  Temp: 98.4 F (36.9 C)  TempSrc: Oral  SpO2: 91%  Weight: 197 lb (89.4 kg)  Height: '5\' 8"'$  (1.727 m)    Assessment & Plan:

## 2021-10-12 NOTE — Assessment & Plan Note (Signed)
Advised to quit smoking and she is not sure she can do it at this time. Has severe COPD and at risk for CAD.

## 2021-10-16 ENCOUNTER — Other Ambulatory Visit: Payer: Self-pay | Admitting: Pulmonary Disease

## 2021-10-17 ENCOUNTER — Other Ambulatory Visit: Payer: Self-pay | Admitting: Internal Medicine

## 2021-10-19 ENCOUNTER — Other Ambulatory Visit (HOSPITAL_COMMUNITY): Payer: Self-pay | Admitting: Psychiatry

## 2021-10-24 ENCOUNTER — Ambulatory Visit (INDEPENDENT_AMBULATORY_CARE_PROVIDER_SITE_OTHER): Payer: Medicare Other | Admitting: Licensed Clinical Social Worker

## 2021-10-24 DIAGNOSIS — F411 Generalized anxiety disorder: Secondary | ICD-10-CM | POA: Diagnosis not present

## 2021-10-24 DIAGNOSIS — F0630 Mood disorder due to known physiological condition, unspecified: Secondary | ICD-10-CM | POA: Diagnosis not present

## 2021-10-24 DIAGNOSIS — F4310 Post-traumatic stress disorder, unspecified: Secondary | ICD-10-CM
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
## 2021-10-26 DIAGNOSIS — M5412 Radiculopathy, cervical region: Secondary | ICD-10-CM | POA: Diagnosis not present

## 2021-10-26 DIAGNOSIS — M5481 Occipital neuralgia: Secondary | ICD-10-CM | POA: Diagnosis not present

## 2021-10-30 ENCOUNTER — Telehealth: Payer: Medicare Other

## 2021-10-30 ENCOUNTER — Other Ambulatory Visit: Payer: Self-pay | Admitting: Neurosurgery

## 2021-10-30 DIAGNOSIS — M5412 Radiculopathy, cervical region: Secondary | ICD-10-CM

## 2021-10-31 ENCOUNTER — Telehealth: Payer: Self-pay | Admitting: Pulmonary Disease

## 2021-10-31 ENCOUNTER — Other Ambulatory Visit: Payer: Self-pay | Admitting: Internal Medicine

## 2021-10-31 NOTE — Telephone Encounter (Signed)
Patient called to request a new script for her medications, Combivent and Dialiresp.  Patient stated she would like it sent to [facility].  Please call patient to discuss at [phone]

## 2021-10-31 NOTE — Telephone Encounter (Signed)
Patient called to request a new script for her medications, Combivent and Dialiresp

## 2021-11-01 MED ORDER — ROFLUMILAST 500 MCG PO TABS: 500.0000 ug | ORAL_TABLET | Freq: Every day | ORAL | 11 refills | Status: DC

## 2021-11-01 MED ORDER — COMBIVENT RESPIMAT 20-100 MCG/ACT IN AERS: 1.0000 | INHALATION_SPRAY | Freq: Four times a day (QID) | RESPIRATORY_TRACT | 11 refills | Status: DC | PRN

## 2021-11-01 NOTE — Telephone Encounter (Signed)
Rx for pt's combivent and daliresp have been sent to [facility] pharmacy. Called and spoke with pt letting her know this had been done and she verbalized understanding. Nothing further needed.

## 2021-11-07 ENCOUNTER — Telehealth: Payer: Self-pay

## 2021-11-07 NOTE — Telephone Encounter (Signed)
Attempted to contact patient to schedule a Palliative Care follow up appointment. No answer left a message to return call. Patient was last seen by [facility] and patient moved she will now be Karen's patient.

## 2021-11-09 DIAGNOSIS — J441 Chronic obstructive pulmonary disease with (acute) exacerbation: Secondary | ICD-10-CM | POA: Diagnosis not present

## 2021-11-09 DIAGNOSIS — G4733 Obstructive sleep apnea (adult) (pediatric): Secondary | ICD-10-CM | POA: Diagnosis not present

## 2021-11-09 DIAGNOSIS — J449 Chronic obstructive pulmonary disease, unspecified: Secondary | ICD-10-CM | POA: Diagnosis not present

## 2021-11-12 ENCOUNTER — Other Ambulatory Visit: Payer: Medicare Other | Admitting: Student

## 2021-11-13 ENCOUNTER — Ambulatory Visit
Admission: RE | Admit: 2021-11-13 | Discharge: 2021-11-13 | Disposition: A | Discharge status: Home / Self Care | Payer: Medicare Other | Source: Ambulatory Visit | Attending: Neurosurgery | Admitting: Neurosurgery

## 2021-11-13 ENCOUNTER — Other Ambulatory Visit: Payer: Medicare Other | Admitting: Family Medicine

## 2021-11-13 VITALS — BP 122/78 | HR 75 | Resp 20

## 2021-11-13 DIAGNOSIS — M654 Radial styloid tenosynovitis [de Quervain]: Secondary | ICD-10-CM

## 2021-11-13 DIAGNOSIS — M5412 Radiculopathy, cervical region: Secondary | ICD-10-CM

## 2021-11-13 DIAGNOSIS — J9611 Chronic respiratory failure with hypoxia: Secondary | ICD-10-CM

## 2021-11-13 DIAGNOSIS — Z789 Other specified health status: Secondary | ICD-10-CM

## 2021-11-13 DIAGNOSIS — M4802 Spinal stenosis, cervical region: Secondary | ICD-10-CM | POA: Diagnosis not present

## 2021-11-13 DIAGNOSIS — J449 Chronic obstructive pulmonary disease, unspecified: Secondary | ICD-10-CM

## 2021-11-13 DIAGNOSIS — M542 Cervicalgia: Secondary | ICD-10-CM | POA: Diagnosis not present
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
## 2021-11-15 ENCOUNTER — Telehealth: Payer: Self-pay | Admitting: *Deleted

## 2021-11-15 ENCOUNTER — Other Ambulatory Visit (HOSPITAL_COMMUNITY): Payer: Self-pay | Admitting: Psychiatry

## 2021-11-15 ENCOUNTER — Encounter: Payer: Self-pay | Admitting: *Deleted

## 2021-11-15 NOTE — Patient Outreach (Signed)
  Care Coordination   Initial Visit Note   11/15/2021 Name: Azaliyah Kennard MRN: [id number] DOB: 1961/02/18  Joneen Boers Pinela is a 61 y.o. year old female who sees [name], MD for primary care. I spoke with  [name] by phone today.  What matters to the patients health and wellness today?  "I just wanted to call you and let you know how good things are going with me lately; I finally got an apartment of my own in [place] and I have a church I am going to regularly; I am so happy I just wanted to share with you because I knew you would want to know after all of our previous conversations.... I am doing really well; I plan to get a flu shot next week when I see my lung doctor; I hate to tell you this, but I did start smoking again-- I have talked to Dr. Sharlet Salina about it, I am just not ready yet to quit, but I am praying that [name] will help me stop.  Other than that, things are going great and I just wanted to update you"   Patient acknowledges that she understands she needs to quit smoking and has been provided smoking cessation resources numerous times in the past; she declines today; no further or ongoing care coordination needs identified today   Goals Addressed             This Visit's Progress    COMPLETED: Care Coordination activities: No follow up required   On track    Care Coordination Interventions: Evaluation of current treatment plan related to COPD and patient's adherence to plan as established by provider Advised patient to provide appropriate vaccination information to provider or CM team member at next visit Advised patient to obtain flu vaccine for upcoming 2023-24 flu/ winter season- she verbalizes plans to do so and confirms she will obtain at time of next week's scheduled pulmonary provider office visit  Reviewed scheduled/upcoming provider appointments including 11/21/21- pulmonary provider; 12/11/21- palliative care provider; 12/11/21- PCP  office visit Assessed social determinant of health barriers          SDOH assessments and interventions completed:  Yes  SDOH Interventions Today    Flowsheet Row Most Recent Value  SDOH Interventions   Food Insecurity Interventions Intervention Not Indicated  Housing Interventions Intervention Not Indicated  [reports currently living in new apartment in [place]  Transportation Interventions Intervention Not Indicated  [patient continues driving self]       Care Coordination Interventions Activated:  Yes  Care Coordination Interventions:  Yes, provided   Follow up plan: No further intervention required.   Encounter Outcome:  Pt. Visit Completed   Oneta Rack, RN, BSN, CCRN Alumnus RN CM Care Coordination/ Transition of [facility] Management [phone]: direct office

## 2021-11-17 ENCOUNTER — Encounter: Payer: Self-pay | Admitting: Family Medicine

## 2021-11-17 DIAGNOSIS — M654 Radial styloid tenosynovitis [de Quervain]: Secondary | ICD-10-CM | POA: Insufficient documentation

## 2021-11-17 DIAGNOSIS — Z789 Other specified health status: Secondary | ICD-10-CM | POA: Insufficient documentation

## 2021-11-17 DIAGNOSIS — J449 Chronic obstructive pulmonary disease, unspecified: Secondary | ICD-10-CM | POA: Insufficient documentation

## 2021-11-20 ENCOUNTER — Telehealth: Payer: Self-pay

## 2021-11-20 NOTE — Telephone Encounter (Signed)
Pt have an appt. 11/26/21 with Dr. Sharlet Salina. Please advise

## 2021-11-20 NOTE — Telephone Encounter (Signed)
Patient called in stating she had an injection in her shoulder and since that shot she has been having severe pain. She is also experiencing some L thumb pain. Stephanie wants x-rays ordered prior to her appointment on 10/2 with Dr.Crawford.  Advised that she will need to be seen so Dr.Crawford can evaluate the shoulder and thumb but patient said Dr.Crawford has done this before and wants a call back.

## 2021-11-20 NOTE — Telephone Encounter (Signed)
(  4:23 pm) PC SW, per request of NP-Karen Highfill attempted to contact patient. SW did not receive an answer, but left a message requesting a call back.

## 2021-11-21 ENCOUNTER — Ambulatory Visit (INDEPENDENT_AMBULATORY_CARE_PROVIDER_SITE_OTHER): Payer: Medicare Other | Admitting: Pulmonary Disease

## 2021-11-21 ENCOUNTER — Encounter: Payer: Self-pay | Admitting: Pulmonary Disease

## 2021-11-21 VITALS — BP 122/70 | HR 75 | Temp 97.8°F | Ht 68.0 in | Wt 196.4 lb

## 2021-11-21 DIAGNOSIS — J439 Emphysema, unspecified: Secondary | ICD-10-CM | POA: Diagnosis not present

## 2021-11-21 DIAGNOSIS — J9611 Chronic respiratory failure with hypoxia: Secondary | ICD-10-CM | POA: Diagnosis not present

## 2021-11-21 DIAGNOSIS — Z9989 Dependence on other enabling machines and devices: Secondary | ICD-10-CM | POA: Diagnosis not present

## 2021-11-21 DIAGNOSIS — Z23 Encounter for immunization: Secondary | ICD-10-CM

## 2021-11-21 DIAGNOSIS — G4733 Obstructive sleep apnea (adult) (pediatric): Secondary | ICD-10-CM | POA: Diagnosis not present

## 2021-11-21 MED ORDER — METHYLPREDNISOLONE 4 MG PO TBPK: ORAL_TABLET | ORAL | 0 refills | Status: AC

## 2021-11-21 MED ORDER — VARENICLINE TARTRATE (STARTER) 0.5 MG X 11 & 1 MG X 42 PO TBPK: 0.5000 mg | ORAL_TABLET | Freq: Every day | ORAL | 0 refills | Status: DC

## 2021-11-21 NOTE — Telephone Encounter (Signed)
Spoke to pt--stated had the since had COVID shot on left shoulder been painful and Dr. Sharlet Salina is aware, from [place] and  just needed x-ray.

## 2021-11-21 NOTE — Progress Notes (Signed)
   Subjective:    Patient ID: Joan [name], female    DOB: 04/19/1960, 61 y.o.   MRN: [id number]  HPI  60 yo smoker for follow-up of severe COPD and OSA -on CPAP 10 cm   Meds -allergy listed to prednisone-this caused pedal edema, prefers Medrol when needed  Chief Complaint  Patient presents with   Follow-up    Pt wasn't on CPAP for a month was waiting on a new water container. She just received yesterday.   Arrives in a wheelchair on oxygen, She admits that she has relapsed with her smoking again, smoking up to 1 pack/day "I do so well when I am not smoking". She wants form to be filled out for [employer]. Needs some assistance to quit smoking, feels like her breathing is worse, no pedal edema, change in weather has affected her. Compliant with Breztri     Significant tests/ events reviewed NPSG 02/2017 >> no OSA HST  04/2017 15/h     02/2019 pFTS >> RATIO 45, fev 1 37% - UNCHANGED , + bd response, DLCO 44%      10/2016 PFTs showed ratio 49, FEV1 of 36%, FVC 58% and DLCO of 34% consistent with severe obstruction.   04/2021 LDCT >> stable, RADS 2  02/2020 LDCT >>  stable nodules  11/2016 CT chest bullous changes BL, 45m RUl & 2 mm LUL  nodules >> stable      ONO 11/2016 on CPAP/room air showed desaturation for 1 hour 45 minutes started on nocturnal oxygen>>   Review of Systems neg for any significant sore throat, dysphagia, itching, sneezing, nasal congestion or excess/ purulent secretions, fever, chills, sweats, unintended wt loss, pleuritic or exertional cp, hempoptysis, orthopnea pnd or change in chronic leg swelling. Also denies presyncope, palpitations, heartburn, abdominal pain, nausea, vomiting, diarrhea or change in bowel or urinary habits, dysuria,hematuria, rash, arthralgias, visual complaints, headache, numbness weakness or ataxia.     Objective:   Physical Exam  Gen. Pleasant, obese, in no distress ENT - no lesions, no post nasal drip Neck: No JVD, no  thyromegaly, no carotid bruits Lungs: no use of accessory muscles, no dullness to percussion, decreased without rales or rhonchi  Cardiovascular: Rhythm regular, heart sounds  normal, no murmurs or gallops, no peripheral edema Musculoskeletal: No deformities, no cyanosis or clubbing , no tremors       Assessment & Plan:   Tobacco abuse -smoking cessation was again emphasized is the most important intervention that would add years to her life. Chantix prescription was provided since this is helped her to quit before. Side effects of worsening depression was discussed

## 2021-11-21 NOTE — Telephone Encounter (Signed)
I would recommend she call and see the person who did the shoulder injection first to check for complications. I am okay to see her for thumb pain and can do x-ray if needed at visit.

## 2021-11-21 NOTE — Assessment & Plan Note (Signed)
She will continue on Breztri. Use Combivent and albuterol nebs for rescue.  Frequent flares are related to ongoing smoking and this connection was emphasized

## 2021-11-21 NOTE — Patient Instructions (Addendum)
[employer] form filled out  x Chantix starter pack and continuation pack with 2 refills x Medrol Dosepak X flu shot RSV vaccine can be obtained at the pharmacy

## 2021-11-21 NOTE — Assessment & Plan Note (Addendum)
She continues on oxygen 24/7.  She is very compliant and oxygen has certainly helped improve her dyspnea and decrease hospitalizations Pulmonary rehab has been discussed in the past [place] form was filled out for oxygen equipment  She is in a chronic stable state today Oxygen saturation was 87% on room air at rest.  Improved on 3 L oxygen to 97% and was able to maintain about 90% on walking

## 2021-11-21 NOTE — Assessment & Plan Note (Signed)
Previously noted to be very compliant on download. She is obtained a humidifier and hopefully this will help. CPAP is only helped improve her daytime somnolence and fatigue  Weight loss encouraged, compliance with goal of at least 4-6 hrs every night is the expectation. Advised against medications with sedative side effects Cautioned against driving when sleepy - understanding that sleepiness will vary on a day to day basis

## 2021-11-26 ENCOUNTER — Ambulatory Visit (INDEPENDENT_AMBULATORY_CARE_PROVIDER_SITE_OTHER): Payer: Medicare Other | Admitting: Internal Medicine

## 2021-11-26 ENCOUNTER — Telehealth: Payer: Self-pay

## 2021-11-26 ENCOUNTER — Encounter: Payer: Self-pay | Admitting: Internal Medicine

## 2021-11-26 ENCOUNTER — Ambulatory Visit (INDEPENDENT_AMBULATORY_CARE_PROVIDER_SITE_OTHER): Payer: Medicare Other

## 2021-11-26 VITALS — BP 124/60 | HR 86 | Temp 97.6°F | Ht 68.0 in | Wt 193.0 lb

## 2021-11-26 DIAGNOSIS — M79645 Pain in left finger(s): Secondary | ICD-10-CM

## 2021-11-26 DIAGNOSIS — E1140 Type 2 diabetes mellitus with diabetic neuropathy, unspecified: Secondary | ICD-10-CM | POA: Diagnosis not present

## 2021-11-26 DIAGNOSIS — M25512 Pain in left shoulder: Secondary | ICD-10-CM

## 2021-11-26 DIAGNOSIS — G8929 Other chronic pain: Secondary | ICD-10-CM

## 2021-11-26 DIAGNOSIS — M79642 Pain in left hand: Secondary | ICD-10-CM | POA: Diagnosis not present

## 2021-11-26 DIAGNOSIS — J449 Chronic obstructive pulmonary disease, unspecified: Secondary | ICD-10-CM | POA: Diagnosis not present

## 2021-11-26 MED ORDER — PREGABALIN 300 MG PO CAPS: 300.0000 mg | ORAL_CAPSULE | Freq: Two times a day (BID) | ORAL | 3 refills | Status: DC

## 2021-11-26 NOTE — Progress Notes (Signed)
   Subjective:   Patient ID: Joan [name], female    DOB: Jul 22, 1960, 61 y.o.   MRN: [id number]  HPI The patient is a 61 YO female coming in for arm and thumb pain. Never got lyrica since our last visit due to problems with her mail order pharmacy.  Review of Systems  Constitutional:  Positive for activity change and fatigue.  HENT: Negative.    Eyes: Negative.   Respiratory:  Positive for cough and shortness of breath. Negative for chest tightness.   Cardiovascular:  Negative for chest pain, palpitations and leg swelling.  Gastrointestinal:  Negative for abdominal distention, abdominal pain, constipation, diarrhea, nausea and vomiting.  Musculoskeletal:  Positive for arthralgias, gait problem and myalgias.  Skin: Negative.   Neurological:  Positive for numbness.  Psychiatric/Behavioral: Negative.      Objective:  Physical Exam Constitutional:      Appearance: She is well-developed.  HENT:     Head: Normocephalic and atraumatic.  Cardiovascular:     Rate and Rhythm: Normal rate and regular rhythm.  Pulmonary:     Effort: Pulmonary effort is normal. No respiratory distress.     Breath sounds: Normal breath sounds. No wheezing or rales.     Comments: Oxygen in place Abdominal:     General: Bowel sounds are normal. There is no distension.     Palpations: Abdomen is soft.     Tenderness: There is no abdominal tenderness. There is no rebound.  Musculoskeletal:        General: Tenderness present.     Cervical back: Normal range of motion.  Skin:    General: Skin is warm and dry.  Neurological:     Mental Status: She is alert and oriented to person, place, and time.     Coordination: Coordination abnormal.     Comments: Wheelchair for long distances     Vitals:   11/26/21 1320  BP: 124/60  Pulse: 86  Temp: 97.6 F (36.4 C)  SpO2: 94%  Weight: 193 lb (87.5 kg)  Height: '5\' 8"'$  (1.727 m)    Assessment & Plan:

## 2021-11-26 NOTE — Assessment & Plan Note (Signed)
Needs PT to help with mobilization. Checking x-ray today to rule out pathology. She has significant arthritis in other joints.

## 2021-11-26 NOTE — Telephone Encounter (Signed)
Recivied fax from [facility] that pt's 5 year Oxygen Replacement is due. Pt must complete walk to re-qualify for O2. Pt is scheduled for OV in December 2023. Appointment note updated. Paperwork placed in Dr. Caroll Rancher mail box. Nothing further needed.   Routing to Dr. Elsworth Soho as Juluis Rainier

## 2021-11-26 NOTE — Assessment & Plan Note (Signed)
Ordered x-ray left hand to assess left thumb. This could be tendonitis or arthritis.

## 2021-11-26 NOTE — Assessment & Plan Note (Signed)
Needs PT to work on stamina and endurance as well as lower extremity strength.

## 2021-11-26 NOTE — Patient Instructions (Addendum)
We will do the x-ray of the shoulder and the hand.  We have sent in the pregabalin to the local pharmacy so you can get that monthly.   We will do some physical therapy for you.

## 2021-11-26 NOTE — Assessment & Plan Note (Signed)
Did not receive lyrica 300 mg BID due to error of mail order. She would like 30 day supply to local pharmacy with refills for convenience which is done today.

## 2021-11-29 ENCOUNTER — Ambulatory Visit (HOSPITAL_COMMUNITY): Payer: Medicare Other | Admitting: Licensed Clinical Social Worker

## 2021-11-30 DIAGNOSIS — M5412 Radiculopathy, cervical region: Secondary | ICD-10-CM | POA: Diagnosis not present

## 2021-12-11 ENCOUNTER — Telehealth: Payer: Self-pay | Admitting: Family Medicine

## 2021-12-11 ENCOUNTER — Other Ambulatory Visit: Payer: Medicare Other | Admitting: Family Medicine

## 2021-12-11 ENCOUNTER — Ambulatory Visit: Payer: Medicare Other | Admitting: Internal Medicine

## 2021-12-11 NOTE — Telephone Encounter (Signed)
TCT patient to alert her that provider running behind and giving an estimated time of arrival at 10:30 am.  Arrived at 10:40 am, knocked, no answer, knocked again no answer.  Pt is no show.  Damaris Hippo FNP-C

## 2021-12-13 ENCOUNTER — Ambulatory Visit (HOSPITAL_COMMUNITY): Payer: Medicare Other | Admitting: Licensed Clinical Social Worker

## 2021-12-14 ENCOUNTER — Telehealth: Payer: Self-pay | Admitting: Pulmonary Disease

## 2021-12-14 NOTE — Telephone Encounter (Signed)
Alden Benjamin,   Can we check to see if we got this paperwork for Dr Elsworth Soho.   Im not sure where his folder is at. So just let me know if we can find this paperwork so we can update the patient.   Thank you

## 2021-12-17 NOTE — Telephone Encounter (Signed)
Hey April! I had a  folder in Dr Bari Mantis cabinet labeled please sign and return to me but he has not returned yet. He is in clinic Wed - Fri this week.

## 2021-12-20 ENCOUNTER — Other Ambulatory Visit: Payer: Self-pay | Admitting: Internal Medicine

## 2021-12-27 ENCOUNTER — Ambulatory Visit (INDEPENDENT_AMBULATORY_CARE_PROVIDER_SITE_OTHER): Payer: Medicare Other | Admitting: Licensed Clinical Social Worker

## 2021-12-27 DIAGNOSIS — F411 Generalized anxiety disorder: Secondary | ICD-10-CM

## 2021-12-27 DIAGNOSIS — F0630 Mood disorder due to known physiological condition, unspecified: Secondary | ICD-10-CM | POA: Diagnosis not present

## 2021-12-27 DIAGNOSIS — F4310 Post-traumatic stress disorder, unspecified: Secondary | ICD-10-CM

## 2021-12-27 NOTE — Progress Notes (Signed)
Virtual Visit via Video Note  I connected with [name] on 12/27/21 at  2:00 PM EDT by a video enabled telemedicine application and verified that I am speaking with the correct person using two identifiers.  Location: Patient: home Provider: home office   I discussed the limitations of evaluation and management by telemedicine and the availability of in person appointments. The patient expressed understanding and agreed to proceed.  I discussed the assessment and treatment plan with the patient. The patient was provided an opportunity to ask questions and all were answered. The patient agreed with the plan and demonstrated an understanding of the instructions.   The patient was advised to call back or seek an in-person evaluation if the symptoms worsen or if the condition fails to improve as anticipated.  I provided 50 minutes of non-face-to-face time during this encounter.  THERAPIST PROGRESS NOTE  Session Time: 2:00 PM to 2:50 PM  Participation Level: Active  Behavioral Response: CasualAlertEuthymic  Type of Therapy: Individual Therapy  Treatment Goals addressed: Work on trauma, anxiety, depression, coping  ProgressTowards Goals: Progressing-review of treatment goals and continue to work on same goals  Interventions: Solution Focused, Strength-based, Supportive, and Other: Coping  Summary: Joan [name] is a 61 y.o. female who presents with sick this weekend. She quit smoking for awhile then picked back up. Maybe not ready to quit and forcing herself. Sound of her voice is terrible therapist noted very hoarse. Every time smoke gets terrible hates it can't change it unless stop smoking. When feeling better starts smoking again. Doesn't think ready, she is doing it for everyone but her. Health is ok. This weekend fluid in lungs. Waited it out when cough liquid comes out like that for 3-4 days and blowing her noise. Found a church going to church. Didn't care what it  was if people going to church going- get out of house found church like. Feel bad wasn't there all weekend fair volunteered didn't show because was sick. Would sometimes go twice on Sunday, class on Tuesday about bonding. Wednesday go activities that have. They go around pass out things to homeless going to buy a kid's hat gloves and scarf. They did a walk went with them but couldn't walk so rode the [place]. After end of walk was a beautiful picture. Wants to show therapist.  The pitcher a sign of divine inspiration.  Feel better now joining the church. Likes the freedom. Has a lot to do gets overwhelmed with apartment as far as cleaning and laundry.Still talk with Beverlyn Roux don't talk to Shanon Brow and [name]. Had a falling out with them. Hiding in her house so not a loss. Found church accident thought [name] intended. It is a [employer]. Got baptized born again. .      Therapist assesses some positive developments for patient from before that will help with mood including joining a church becoming very active before she was not very connected now she is therapist noted will help with improvement in mood and patient agrees.  Should also has more freedom this is something she really enjoys noted it even as a strength and treatment plan.  Therapist noted that therapist identified as a support to find this is helpful for patient and working on symptoms.  Viewed any significant events changes in mood and functioning as last session was end of August appears positive developments although utilize motivational strategies for patient smoking she has lung issues so would be beneficial for her to stop  smoking.  Positive that she stopped but has started again. Suicidal/Homicidal: No  Plan: Return again in 2 weeks.2.Marland KitchenStart trauma work, process feelings related to stressors  Diagnosis: PTSD, hallucinations, GAD, Mood Disorder in conditions classified elsewhere  Collaboration of Care: Other none needed  Patient/Guardian was  advised Release of Information must be obtained prior to any record release in order to collaborate their care with an outside provider. Patient/Guardian was advised if they have not already done so to contact the registration department to sign all necessary forms in order for [place] to release information regarding their care.   Consent: Patient/Guardian gives verbal consent for treatment and assignment of benefits for services provided during this visit. Patient/Guardian expressed understanding and agreed to proceed.   [name], LCSW 12/27/2021

## 2022-01-01 ENCOUNTER — Telehealth: Payer: Self-pay | Admitting: Cardiovascular Disease

## 2022-01-01 NOTE — Telephone Encounter (Signed)
I left a message for the pt to call the office in regard to her call earlier about per op clearance. I have reviewed the chart and I see some notes about an appt for maybe pre op clearance, however this seems to have not taken place, see phone notes from 09/17/21. I also do not see any clearance notes. I have left message for the pt the best thing to do first is really to have the request for cardiac clearance to be faxed to our office [phone] before we can proceed with making an appt or going any further.

## 2022-01-01 NOTE — Telephone Encounter (Signed)
Follow Up:     Patient is calling back to check on the status of her clearance. Her surgeon's office, said they have not received a clearance.

## 2022-01-03 ENCOUNTER — Telehealth: Payer: Self-pay | Admitting: *Deleted

## 2022-01-03 ENCOUNTER — Telehealth: Payer: Self-pay

## 2022-01-03 NOTE — Telephone Encounter (Signed)
  Pt is calling to set up preop televisit

## 2022-01-03 NOTE — Telephone Encounter (Signed)
Primary Cardiologist:Jonathan Gwenlyn Found, MD   Preoperative team, please contact this patient and set up a phone call appointment for further preoperative risk assessment. Please obtain consent and complete medication review. Thank you for your help.   I confirm that guidance regarding antiplatelet and oral anticoagulation therapy has been completed and, if necessary, noted below (none requested).   Emmaline Life, NP-C  01/03/2022, 11:33 AM [address], [address] Office [phone] Fax [phone]

## 2022-01-03 NOTE — Telephone Encounter (Signed)
See other phone note. Pt has tele pre op appt 01/09/22

## 2022-01-03 NOTE — Telephone Encounter (Signed)
...     Pre-operative Risk Assessment    Patient Name: Joan [name]  DOB: May 13, 1960 MRN: [id number]      Request for Surgical Clearance    Procedure:   cervical fusion  Date of Surgery:  Clearance TBD                                 Surgeon:  Dr Charlett Blake Surgeon's Group or Practice Name:  [facility] Phone number:  [phone] Fax number:  [phone]   Type of Clearance Requested:   - Medical    Type of Anesthesia:  General    Additional requests/questions:    SignedMerry Lofty   01/03/2022, 8:32 AM

## 2022-01-03 NOTE — Telephone Encounter (Signed)
Pt is agreeable to plan of care for tele pre op appt 01/09/22 @ 3 pm.  Med rec and consent are done.

## 2022-01-03 NOTE — Telephone Encounter (Signed)
Pt is agreeable to plan of care for tele pre op appt 01/09/22 @ 3 pm.  Med rec and consent are done.     Patient Consent for Virtual Visit        Joan [name] has provided verbal consent on 01/03/2022 for a virtual visit (video or telephone).   CONSENT FOR VIRTUAL VISIT FOR:  Joan [name]  By participating in this virtual visit I agree to the following:  I hereby voluntarily request, consent and authorize [facility] and its employed or contracted physicians, physician assistants, nurse practitioners or other licensed health care professionals (the Practitioner), to provide me with telemedicine health care services (the "Services") as deemed necessary by the treating Practitioner. I acknowledge and consent to receive the Services by the Practitioner via telemedicine. I understand that the telemedicine visit will involve communicating with the Practitioner through live audiovisual communication technology and the disclosure of certain medical information by electronic transmission. I acknowledge that I have been given the opportunity to request an in-person assessment or other available alternative prior to the telemedicine visit and am voluntarily participating in the telemedicine visit.  I understand that I have the right to withhold or withdraw my consent to the use of telemedicine in the course of my care at any time, without affecting my right to future care or treatment, and that the Practitioner or I may terminate the telemedicine visit at any time. I understand that I have the right to inspect all information obtained and/or recorded in the course of the telemedicine visit and may receive copies of available information for a reasonable fee.  I understand that some of the potential risks of receiving the Services via telemedicine include:  Delay or interruption in medical evaluation due to technological equipment failure or disruption; Information transmitted may not  be sufficient (e.g. poor resolution of images) to allow for appropriate medical decision making by the Practitioner; and/or  In rare instances, security protocols could fail, causing a breach of personal health information.  Furthermore, I acknowledge that it is my responsibility to provide information about my medical history, conditions and care that is complete and accurate to the best of my ability. I acknowledge that Practitioner's advice, recommendations, and/or decision may be based on factors not within their control, such as incomplete or inaccurate data provided by me or distortions of diagnostic images or specimens that may result from electronic transmissions. I understand that the practice of medicine is not an exact science and that Practitioner makes no warranties or guarantees regarding treatment outcomes. I acknowledge that a copy of this consent can be made available to me via my patient portal ([facility]), or I can request a printed copy by calling the office of [facility].    I understand that my insurance will be billed for this visit.   I have read or had this consent read to me. I understand the contents of this consent, which adequately explains the benefits and risks of the Services being provided via telemedicine.  I have been provided ample opportunity to ask questions regarding this consent and the Services and have had my questions answered to my satisfaction. I give my informed consent for the services to be provided through the use of telemedicine in my medical care

## 2022-01-09 ENCOUNTER — Ambulatory Visit: Payer: Medicare Other | Attending: Internal Medicine | Admitting: Physician Assistant

## 2022-01-09 ENCOUNTER — Telehealth: Payer: Self-pay | Admitting: Cardiovascular Disease

## 2022-01-09 DIAGNOSIS — Z01810 Encounter for preprocedural cardiovascular examination: Secondary | ICD-10-CM

## 2022-01-09 NOTE — Telephone Encounter (Signed)
Left message to call back to reschedule tele pre op appt.

## 2022-01-09 NOTE — Progress Notes (Signed)
Virtual Visit via Telephone Note   Because of Joan [name]'s co-morbid illnesses, she is at least at moderate risk for complications without adequate follow up.  This format is felt to be most appropriate for this patient at this time.  The patient did not have access to video technology/had technical difficulties with video requiring transitioning to audio format only (telephone).  All issues noted in this document were discussed and addressed.  No physical exam could be performed with this format.  Please refer to the patient's chart for her consent to telehealth for [facility].  Evaluation Performed:  Preoperative cardiovascular risk assessment _____________   Date:  01/09/2022   Patient ID:  Joan [name], DOB 04/14/1960, MRN [id number] Patient Location:  Home Provider location:   Office  Primary Care Provider:  [name], MD Primary Cardiologist:  Quay Burow, MD  Chief Complaint / Patient Profile   61 y.o. y/o female with a h/o COPD, Anxiety, previous tobacco abuse, hypertension, PTSD, and chronic back pain who is pending cervical fusion and presents today for telephonic preoperative cardiovascular risk assessment.  Past Medical History    Past Medical History:  Diagnosis Date   Acute respiratory failure with hypoxia ([place])    Alcoholism and drug addiction in family    Anxiety    Appendicitis    Arthritis    "knees, hips, lower back" (09/25/2016)   Asthma    Bipolar disorder ([place])    Chronic bronchitis ([place])    "all the time" (09/25/2016)   Chronic leg pain    RLE   Chronic lower back pain    COPD (chronic obstructive pulmonary disease) ([place])    Depression    Diabetes mellitus without complication (HCC)    Diabetic neuropathy ([place])    Early cataracts, bilateral    Emphysema of lung ([place])    Generalized anxiety disorder 02/09/2020   GERD (gastroesophageal reflux disease)    Headache    Hepatitis C    "treated back in 2001-2002"    High cholesterol    Hypertension    Hypothyroidism    Incarcerated ventral hernia 04/04/2017   Mood disorder in conditions classified elsewhere 02/09/2020   On home oxygen therapy    "2L; at nighttime" (04/04/2017)   OSA on CPAP    CPAP with Oxygen 2 liters   Paranoid ([place])    Pneumonia    "all the time" (09/25/2016)   Positive PPD    with negative chest x ray   PTSD (post-traumatic stress disorder) 02/09/2020   Tachycardia    patient reports with exertion ; denies any other conditions    Thyroid disease    Wears glasses    Wears partial dentures    Past Surgical History:  Procedure Laterality Date   BACK SURGERY     BIOPSY  11/24/2018   Procedure: BIOPSY;  Surgeon: Thornton Park, MD;  Location: [facility];  Service: Gastroenterology;;   CARDIAC CATHETERIZATION     COLONOSCOPY     COLONOSCOPY WITH PROPOFOL N/A 11/24/2018   Procedure: COLONOSCOPY WITH PROPOFOL;  Surgeon: Thornton Park, MD;  [facility]: [facility];  Service: Gastroenterology;  Laterality: N/A;   CYST EXCISION     removal of cyst in sinuses   DILATION AND CURETTAGE OF UTERUS     ESOPHAGOGASTRODUODENOSCOPY (EGD) WITH PROPOFOL N/A 11/24/2018   Procedure: ESOPHAGOGASTRODUODENOSCOPY (EGD) WITH PROPOFOL;  Surgeon: Thornton Park, MD;  [facility];  Service: Gastroenterology;  Laterality: N/A;   FOOT SURGERY Bilateral  bone removed from 4th and 5 th toes and put in pin then took pin out   [employer]  ~ 2014/2015   "removed mesh & infection"   [place] N/A 04/04/2017   Procedure: LAPAROSCOPIC INCISIONAL HERNIA REPAIR WITH MESH;  Surgeon: Fanny Skates, MD;  Location: [facility];  Service: General;  Laterality: N/A;   LACERATION REPAIR Right    repair wrist artery from laceration from ice skate   LAPAROSCOPIC APPENDECTOMY N/A 03/31/2019   Procedure: APPENDECTOMY LAPAROSCOPIC;  Surgeon: Ralene Ok, MD;  [facility];  Service: General;  Laterality:  N/A;   LAPAROSCOPIC APPENDECTOMY N/A 07/09/2019   Procedure: APPENDECTOMY LAPAROSCOPIC;  Surgeon: Ralene Ok, MD;  Location: [facility];  Service: General;  Laterality: N/A;   LAPAROSCOPIC INCISIONAL / UMBILICAL / [place]  04/04/2017   LAPAROSCOPIC INCISIONAL HERNIA REPAIR WITH MESH   LIPOMA EXCISION Right    fattty lipoma in neck   NASAL SEPTUM SURGERY     POSTERIOR LUMBAR FUSION  2015/2016 X 2   "added rods and screws"   SINOSCOPY     TOTAL HIP ARTHROPLASTY Right 02/05/2017   Procedure: RIGHT TOTAL HIP ARTHROPLASTY;  Surgeon: [name], MD;  [facility];  Service: Orthopedics;  Laterality: Right;   TOTAL HIP ARTHROPLASTY Left 06/18/2017   Procedure: LEFT TOTAL HIP ARTHROPLASTY;  Surgeon: [name], MD;  [facility];  Service: Orthopedics;  Laterality: Left;   TOTAL HIP REVISION Left 01/26/2019   Procedure: TOTAL HIP REVISION;  Surgeon: Paralee Cancel, MD;  [facility];  Service: Orthopedics;  Laterality: Left;  2 hrs   TUBAL LIGATION     UMBILICAL HERNIA REPAIR  ~ 2013   w/mesh   UPPER GI ENDOSCOPY      Allergies  Allergies  Allergen Reactions   Keflex [Cephalexin] Rash and Other (See Comments)    Has tolerated amoxicillin since had keflex   Shellfish Allergy Shortness Of Breath, Nausea And Vomiting and Other (See Comments)    Stomach cramps   Tetracyclines & Related Anaphylaxis, Swelling and Other (See Comments)    Throat swelling requiring hospitalization   Dilaudid [Hydromorphone Hcl] Other (See Comments)    "Lethargy"   Prednisone Other (See Comments)    "counter reacts", has tolerated IV medrol   Clindamycin/Lincomycin Other (See Comments)    Burning sensation in her mouth down her throat    Incruse Ellipta [Umeclidinium Bromide] Nausea Only   Molds & Smuts     itchy   Other    Tuberculin Tests Other (See Comments)    False postive    History of Present Illness    Joan [name] is a 61 y.o. female who presents via  audio/video conferencing for a telehealth visit today.  Pt was last seen in cardiology clinic on 04/17/21 by Dr. Gwenlyn Found.  At that time Joan [name] was doing well. She was oxygen dependent at that time and wheelchair bound.  The patient is now pending procedure as outlined above.   Patient called x 2 today. She will need to reschedule her appointment.    Home Medications    Prior to Admission medications   Medication Sig Start Date End Date Taking? Authorizing Provider  Accu-Chek Softclix Lancets lancets Use as instructed 05/21/21   [name], MD  acyclovir (ZOVIRAX) 400 MG tablet TAKE 1 TABLET BY MOUTH  TWICE DAILY Patient taking differently: 400 mg in the morning. 06/18/21  [name], MD  albuterol (PROVENTIL) (2.5 MG/3ML) 0.083% nebulizer solution Take 3 mLs (2.5 mg total) by nebulization 2 (two) times daily. And every 6 hours as needed.  DX: J44.9 01/05/21   [name], NP  atenolol (TENORMIN) 25 MG tablet Take 25 mg by mouth daily. 09/10/20   [provider]  atorvastatin (LIPITOR) 20 MG tablet TAKE 1 TABLET BY MOUTH ONCE  DAILY 10/30/21   [name], MD  blood glucose meter kit and supplies Dispense based on patient and insurance preference. Use up to two times daily as directed. (FOR ICD-10 E10.9, E11.9). 04/11/21   [name], MD  Budeson-Glycopyrrol-Formoterol (BREZTRI AEROSPHERE) 160-9-4.8 MCG/ACT AERO Inhale 2 puffs into the lungs in the morning and at bedtime. 05/08/21   [name], MD  buPROPion (WELLBUTRIN XL) 300 MG 24 hr tablet Take 1 tablet (300 mg total) by mouth daily. 10/12/21   [name], MD  busPIRone (BUSPAR) 5 MG tablet TAKE 1/2 TO 1 TABLET BY MOUTH  TWICE DAILY AS NEEDED Patient taking differently: Take 5 mg by mouth at bedtime. 08/23/21   [name], MD  Camphor-Menthol-Methyl Sal (SALONPAS) 3.03-02-08 % PTCH Apply 1 patch topically daily as needed.    [provider]  cetirizine (ZYRTEC)  10 MG tablet Take 1 tablet (10 mg total) by mouth 2 (two) times daily. 08/09/21 01/03/22  Valentina Shaggy, MD  cyclobenzaprine (FLEXERIL) 10 MG tablet Take 1 tablet (10 mg total) by mouth at bedtime. 06/12/21   [name], MD  diphenhydrAMINE (BENADRYL) 25 mg capsule Take 25 mg by mouth 2 (two) times daily.    [provider]  fluticasone (FLONASE) 50 MCG/ACT nasal spray Place 2 sprays into both nostrils daily. Patient not taking: Reported on 01/03/2022 09/05/21 12/04/21  Valentina Shaggy, MD  glucose blood test strip Use as instructed 09/10/21   [name], MD  hydrochlorothiazide (HYDRODIURIL) 25 MG tablet TAKE 1 TABLET BY MOUTH DAILY 10/30/21   [name], MD  Ipratropium-Albuterol (COMBIVENT RESPIMAT) 20-100 MCG/ACT AERS respimat Inhale 1 puff into the lungs every 6 (six) hours as needed for wheezing or shortness of breath. 11/01/21   [name], MD  levothyroxine (SYNTHROID) 25 MCG tablet TAKE 1 TABLET BY MOUTH DAILY  BEFORE BREAKFAST 10/30/21   [name], MD  Liniments [facility] ARTHRITIS PAIN RELIEF EX) Apply 1 patch topically daily as needed (to painful sites- remove as directed).     [provider]  montelukast (SINGULAIR) 10 MG tablet TAKE 1 TABLET BY MOUTH AT  BEDTIME 10/17/21   [name], MD  nystatin-triamcinolone ointment [facility]) Apply 1 application topically 2 (two) times daily as needed (applied to skin folds/groins/under breast skin irritation rash.). 09/08/17   [name], MD  OXYGEN Inhale 3 L/min into the lungs See admin instructions. Inhale  3 L/min of oxygen into the lungs w/CPAP at bedtime and as needed for shortness of breath with "strenuous activity" during the day    [provider]  pantoprazole (PROTONIX) 40 MG tablet TAKE 1 TABLET BY MOUTH DAILY 12/21/21   [name], MD  PATADAY 0.2 % SOLN Apply 1 drop to eye daily. 08/20/19   [provider]  polyethylene glycol  (MIRALAX / GLYCOLAX) 17 g packet Take 17 g by mouth daily as needed for mild constipation. 04/14/19   Meuth, Brooke A, PA-C  pregabalin (LYRICA) 300 MG capsule Take 1 capsule (300 mg total) by mouth  2 (two) times daily. 11/26/21   [name], MD  QUEtiapine (SEROQUEL) 50 MG tablet TAKE 1 TABLET BY MOUTH AT  BEDTIME 11/16/21   [name], MD  roflumilast (DALIRESP) 500 MCG TABS tablet Take 1 tablet (500 mcg total) by mouth daily. 11/01/21   [name], MD  silver sulfADIAZINE (SILVADENE) 1 % cream Apply 1 Application topically daily. 08/10/21   Edrick Kins, DPM  traZODone (DESYREL) 100 MG tablet TAKE 1 TABLET BY MOUTH AT  BEDTIME 12/21/21   [name], MD  Varenicline Tartrate, Starter, (CHANTIX STARTING MONTH PAK) 0.5 MG X 11 & 1 MG X 42 TBPK Take 0.5 mg by mouth daily. Patient not taking: Reported on 01/03/2022 11/21/21   [name], MD  venlafaxine XR (EFFEXOR-XR) 150 MG 24 hr capsule TAKE 1 CAPSULE BY MOUTH DAILY  WITH BREAKFAST 12/21/21   [name], MD  witch hazel-glycerin (TUCKS) pad Apply topically as needed for itching. 08/18/20   Samuella Cota, MD  Grant Ruts INHUB 250-50 MCG/ACT AEPB Inhale 1 puff into the lungs 2 (two) times daily. Patient not taking: Reported on 01/03/2022 09/25/21   [provider]    Physical Exam    Vital Signs:  Etoile Looman does not have vital signs available for review today.  Given telephonic nature of communication, physical exam is limited. AAOx3. NAD. Normal affect.  Speech and respirations are unlabored.  Accessory Clinical Findings    None  Assessment & Plan    1.  Preoperative Cardiovascular Risk Assessment:  Patient called x 2 today and left a VM.     Elgie Collard, PA-C  01/09/2022, 2:59 PM

## 2022-01-09 NOTE — Telephone Encounter (Signed)
Pt missed her preop televisit today and needs to r/s

## 2022-01-09 NOTE — Telephone Encounter (Signed)
Patient is returning call.  °

## 2022-01-10 ENCOUNTER — Ambulatory Visit (INDEPENDENT_AMBULATORY_CARE_PROVIDER_SITE_OTHER): Payer: Medicare Other | Admitting: Licensed Clinical Social Worker

## 2022-01-10 ENCOUNTER — Telehealth: Payer: Self-pay | Admitting: Internal Medicine

## 2022-01-10 DIAGNOSIS — F411 Generalized anxiety disorder: Secondary | ICD-10-CM | POA: Diagnosis not present

## 2022-01-10 DIAGNOSIS — F4310 Post-traumatic stress disorder, unspecified: Secondary | ICD-10-CM | POA: Diagnosis not present

## 2022-01-10 DIAGNOSIS — F0630 Mood disorder due to known physiological condition, unspecified: Secondary | ICD-10-CM | POA: Diagnosis not present

## 2022-01-10 DIAGNOSIS — R443 Hallucinations, unspecified: Secondary | ICD-10-CM

## 2022-01-10 DIAGNOSIS — J9611 Chronic respiratory failure with hypoxia: Secondary | ICD-10-CM

## 2022-01-10 NOTE — Progress Notes (Signed)
Virtual Visit via Video Note  I connected with [name] on 01/10/22 at  2:00 PM EST by a video enabled telemedicine application and verified that I am speaking with the correct person using two identifiers.  Location: Patient: home Provider: home office   I discussed the limitations of evaluation and management by telemedicine and the availability of in person appointments. The patient expressed understanding and agreed to proceed.   I discussed the assessment and treatment plan with the patient. The patient was provided an opportunity to ask questions and all were answered. The patient agreed with the plan and demonstrated an understanding of the instructions.   The patient was advised to call back or seek an in-person evaluation if the symptoms worsen or if the condition fails to improve as anticipated.  I provided 55 minutes of non-face-to-face time during this encounter.  THERAPIST PROGRESS NOTE  Session Time: 2:00 PM to 2:55 PM  Participation Level: Active  Behavioral Response: CasualAlertEuthymic  Type of Therapy: Individual Therapy  Treatment Goals addressed:  Work on trauma, anxiety, depression, coping  ProgressTowards Goals: Progressing-patient using therapy  therapeutically to process thoughts and feelings to help with coping  Interventions: Solution Focused, Strength-based, Supportive, and Other: copoing  Summary: Tayden Duran is a 61 y.o. female who presents with lost weight. Not eating as much. Only thing got upset about sister picked up small storage containers filled four of them with soup and took the rest for her. [name] said to leave it alone. Don't care grateful she made it and grateful to get some. House is coming along has to put up some curtains try to do herself. Probably this week the church is going to be open all day and night. Love her church. Goes Tuesday has a class on Bondage. Where you are stuck can't move forward or go back in your life.  Last night more talking and laughing more than class. Wednesday night at church. Sunday go to church and Saturday things going on don't know but be there. Not doing Thanksgiving go to church. Prepare food for church. People come by for free Thanksgiving dinner. They are going to prepare it. Her family not making e proper meal not going to go not going to have holiday doesn't have meaning just visiting. Likes being at church made friends there and care about her. Nice for being wanted and cared for. Doesn't get that anywhere else. Told sisters nobody going to know dying unless smell the stench and call police. [name] and [name] don't call. Wasn't t feeling good coughing up liquid and mucous and wouldn't stop. Try to keep quiet and not move around. Couldn't go to recliner to bedroom or bathroom without being out of breath. Thought had Afib 4-5 days straight. Discomfort in chest. Treated like a lung condition sick Thursday to Tuesday morning a little better. It was her lungs. Got oxygen and nebulizer Figure and treat like normally do take medication, stayed quiet and left bed up couldn't breath laying straight down. It was horrible. Thinking of hospital don't want to go. Neck surgery getting done by a doctor. Every doctor cleared her. This doctor already seen him once a year talk to somebody before form filled out. This is heart doctor appointment next week. Doctor for spine who is doing surgery disc 3-6 need to be repaired and lot of arthritis will burn nerves. Pain in neck to shoulders very painful. Has to deal with pain without anything. Also has to deal with heart  doctor. Now saying no need to talk to her. "What do they need to talk to me about?" Why making a big deal? Every time goes fine. Needs to get out of pain. Prays Jesus to get out of pain helps. They have to do their job and get it over with. Can't wait.  Therapist explored more about her class bondage about life situations and passages in the Bible.  Shared about things in life if pertains to that also pertain to the Bible. Come out think different change some things.  Therapist noted the correlation with therapy. Talks to  [place] once in [place] with [name]. Once a month [name] comes to get her to eat and shopping. Daughter Kerry Hough has been calling her for a little bit a little bit more of the calling, Chrissey all the time but mad at her right now. Cecilie Lowers doesn't like calling.    Therapist reviewed thoughts and feelings and provided space and support assess significantly helpful for patient to have an outlet that helps with coping, helping her to process life events as well as stressors.  Therapist noticed positive direction for patient and good news that she talked to her particularly her involvement with the church goes there a few days a week has friends has people who care about her very positive development for patient.  Therapist noted helpful to have counseling as patient herself says and working with her class to look at things differently and make a few changes noted in the process how helpful spirituality is to do that assisting with focus and goals of counseling. Suicidal/Homicidal: No Plan: Return again in 2 weeks.2.Marland KitchenStart trauma work, process feelings related to stressors  Diagnosis: PTSD, hallucinations, GAD, Mood Disorder in conditions classified elsewhere  Collaboration of Care: Other none needed  Patient/Guardian was advised Release of Information must be obtained prior to any record release in order to collaborate their care with an outside provider. Patient/Guardian was advised if they have not already done so to contact the registration department to sign all necessary forms in order for [place] to release information regarding their care.   Consent: Patient/Guardian gives verbal consent for treatment and assignment of benefits for services provided during this visit. Patient/Guardian expressed understanding and agreed to proceed.   [name], LCSW 01/10/2022

## 2022-01-10 NOTE — Telephone Encounter (Signed)
Joan [name] at [facility] conferenced pt in on phone call to ask PCP to to order Gay and submit a prior authorization request.  Send supporting documents, medical diagnosis documentation with PRIOR AUTH REQUEST.  Pt verbalized she needs Housekeeping, Personal care, Meal prep, Laundry services and more.  Pt states she is asking for help due to her COPD and more time in her wheelchair.  Pt prefers [facility], they serve [place], right next door to her.  [place] [email or web address]

## 2022-01-10 NOTE — Telephone Encounter (Signed)
Pt has been rescheduled for 01/15/22 2:20.+

## 2022-01-12 NOTE — Progress Notes (Signed)
No showed for appt.  Damaris Hippo FNP-C

## 2022-01-13 ENCOUNTER — Other Ambulatory Visit: Payer: Self-pay | Admitting: Internal Medicine

## 2022-01-14 DIAGNOSIS — D2361 Other benign neoplasm of skin of right upper limb, including shoulder: Secondary | ICD-10-CM | POA: Diagnosis not present

## 2022-01-14 DIAGNOSIS — L720 Epidermal cyst: Secondary | ICD-10-CM | POA: Diagnosis not present

## 2022-01-14 DIAGNOSIS — L821 Other seborrheic keratosis: Secondary | ICD-10-CM | POA: Diagnosis not present

## 2022-01-14 DIAGNOSIS — D2261 Melanocytic nevi of right upper limb, including shoulder: Secondary | ICD-10-CM | POA: Diagnosis not present

## 2022-01-14 DIAGNOSIS — D692 Other nonthrombocytopenic purpura: Secondary | ICD-10-CM | POA: Diagnosis not present

## 2022-01-14 NOTE — Telephone Encounter (Signed)
This message is not clear. Does patient need new order for home health for an aide? Does she already have an aide and is requesting more hours for the aide? Does this need to be send to a specific place or fax for her insurance to cover (as most insurance will not cover a home health aide)?

## 2022-01-15 ENCOUNTER — Ambulatory Visit: Payer: Medicare Other | Attending: Internal Medicine | Admitting: General Practice

## 2022-01-15 DIAGNOSIS — Z01810 Encounter for preprocedural cardiovascular examination: Secondary | ICD-10-CM

## 2022-01-15 NOTE — Progress Notes (Signed)
Virtual Visit via Telephone Note   Because of Joan [name]'s co-morbid illnesses, she is at least at moderate risk for complications without adequate follow up.  This format is felt to be most appropriate for this patient at this time.  The patient did not have access to video technology/had technical difficulties with video requiring transitioning to audio format only (telephone).  All issues noted in this document were discussed and addressed.  No physical exam could be performed with this format.  Please refer to the patient's chart for her consent to telehealth for [facility].  Evaluation Performed:  Preoperative cardiovascular risk assessment _____________   Date:  01/15/2022   Patient ID:  Joan [name], DOB 11-15-1960, MRN [id number] Patient Location:  Home Provider location:   Office  Primary Care Provider:  [name], MD Primary Cardiologist:  Quay Burow, MD  Chief Complaint / Patient Profile   61 y.o. y/o female with a h/o COPD, anxiety, tobacco abuse, HTN, PTSD, chronic back pain who is pending cervical fusion and presents today for telephonic preoperative cardiovascular risk assessment.  Past Medical History    Past Medical History:  Diagnosis Date   Acute respiratory failure with hypoxia ([place])    Alcoholism and drug addiction in family    Anxiety    Appendicitis    Arthritis    "knees, hips, lower back" (09/25/2016)   Asthma    Bipolar disorder ([place])    Chronic bronchitis ([place])    "all the time" (09/25/2016)   Chronic leg pain    RLE   Chronic lower back pain    COPD (chronic obstructive pulmonary disease) ([place])    Depression    Diabetes mellitus without complication (HCC)    Diabetic neuropathy ([place])    Early cataracts, bilateral    Emphysema of lung ([place])    Generalized anxiety disorder 02/09/2020   GERD (gastroesophageal reflux disease)    Headache    Hepatitis C    "treated back in 2001-2002"   High cholesterol     Hypertension    Hypothyroidism    Incarcerated ventral hernia 04/04/2017   Mood disorder in conditions classified elsewhere 02/09/2020   On home oxygen therapy    "2L; at nighttime" (04/04/2017)   OSA on CPAP    CPAP with Oxygen 2 liters   Paranoid ([place])    Pneumonia    "all the time" (09/25/2016)   Positive PPD    with negative chest x ray   PTSD (post-traumatic stress disorder) 02/09/2020   Tachycardia    patient reports with exertion ; denies any other conditions    Thyroid disease    Wears glasses    Wears partial dentures    Past Surgical History:  Procedure Laterality Date   BACK SURGERY     BIOPSY  11/24/2018   Procedure: BIOPSY;  Surgeon: Thornton Park, MD;  Location: [facility];  Service: Gastroenterology;;   CARDIAC CATHETERIZATION     COLONOSCOPY     COLONOSCOPY WITH PROPOFOL N/A 11/24/2018   Procedure: COLONOSCOPY WITH PROPOFOL;  Surgeon: Thornton Park, MD;  [facility]: [facility];  Service: Gastroenterology;  Laterality: N/A;   CYST EXCISION     removal of cyst in sinuses   DILATION AND CURETTAGE OF UTERUS     ESOPHAGOGASTRODUODENOSCOPY (EGD) WITH PROPOFOL N/A 11/24/2018   Procedure: ESOPHAGOGASTRODUODENOSCOPY (EGD) WITH PROPOFOL;  Surgeon: Thornton Park, MD;  [facility];  Service: Gastroenterology;  Laterality: N/A;   FOOT SURGERY Bilateral  bone removed from 4th and 5 th toes and put in pin then took pin out   [place]  ~ 2014/2015   "removed mesh & infection"   [place] N/A 04/04/2017   Procedure: LAPAROSCOPIC INCISIONAL HERNIA REPAIR WITH MESH;  Surgeon: Fanny Skates, MD;  Location: [facility];  Service: General;  Laterality: N/A;   LACERATION REPAIR Right    repair wrist artery from laceration from ice skate   LAPAROSCOPIC APPENDECTOMY N/A 03/31/2019   Procedure: APPENDECTOMY LAPAROSCOPIC;  Surgeon: Ralene Ok, MD;  [facility]: [facility];  Service: General;  Laterality: N/A;   LAPAROSCOPIC  APPENDECTOMY N/A 07/09/2019   Procedure: APPENDECTOMY LAPAROSCOPIC;  Surgeon: Ralene Ok, MD;  Location: [facility];  Service: General;  Laterality: N/A;   LAPAROSCOPIC INCISIONAL / UMBILICAL / [place]  04/04/2017   LAPAROSCOPIC INCISIONAL HERNIA REPAIR WITH MESH   LIPOMA EXCISION Right    fattty lipoma in neck   NASAL SEPTUM SURGERY     POSTERIOR LUMBAR FUSION  2015/2016 X 2   "added rods and screws"   SINOSCOPY     TOTAL HIP ARTHROPLASTY Right 02/05/2017   Procedure: RIGHT TOTAL HIP ARTHROPLASTY;  Surgeon: [name], MD;  [facility];  Service: Orthopedics;  Laterality: Right;   TOTAL HIP ARTHROPLASTY Left 06/18/2017   Procedure: LEFT TOTAL HIP ARTHROPLASTY;  Surgeon: [name], MD;  [facility];  Service: Orthopedics;  Laterality: Left;   TOTAL HIP REVISION Left 01/26/2019   Procedure: TOTAL HIP REVISION;  Surgeon: Paralee Cancel, MD;  Location: [facility];  Service: Orthopedics;  Laterality: Left;  2 hrs   TUBAL LIGATION     UMBILICAL HERNIA REPAIR  ~ 2013   w/mesh   UPPER GI ENDOSCOPY      Allergies  Allergies  Allergen Reactions   Keflex [Cephalexin] Rash and Other (See Comments)    Has tolerated amoxicillin since had keflex   Shellfish Allergy Shortness Of Breath, Nausea And Vomiting and Other (See Comments)    Stomach cramps   Tetracyclines & Related Anaphylaxis, Swelling and Other (See Comments)    Throat swelling requiring hospitalization   Dilaudid [Hydromorphone Hcl] Other (See Comments)    "Lethargy"   Prednisone Other (See Comments)    "counter reacts", has tolerated IV medrol   Clindamycin/Lincomycin Other (See Comments)    Burning sensation in her mouth down her throat    Incruse Ellipta [Umeclidinium Bromide] Nausea Only   Molds & Smuts     itchy   Other    Tuberculin Tests Other (See Comments)    False postive    History of Present Illness    [name] is a 61 y.o. female who presents via audio/video conferencing  for a telehealth visit today.  Pt was last seen in cardiology clinic on 04/17/2021 by Dr.Berry.  At that time Keiara Sneeringer was doing well .  The patient is now pending procedure as outlined above. Since her last visit, she remained stable from a cardiac standpoint.  Today she denies chest pain, shortness of breath, lower extremity edema, fatigue, palpitations, melena, hematuria, hemoptysis, diaphoresis, weakness, presyncope, syncope, orthopnea, and PND.   Home Medications    Prior to Admission medications   Medication Sig Start Date End Date Taking? Authorizing Provider  Accu-Chek Softclix Lancets lancets Use as instructed 05/21/21   [name], MD  acyclovir (ZOVIRAX) 400 MG tablet TAKE 1 TABLET BY MOUTH  TWICE DAILY Patient  taking differently: 400 mg in the morning. 06/18/21   [name], MD  albuterol (PROVENTIL) (2.5 MG/3ML) 0.083% nebulizer solution Take 3 mLs (2.5 mg total) by nebulization 2 (two) times daily. And every 6 hours as needed.  DX: J44.9 01/05/21   [name], NP  atenolol (TENORMIN) 25 MG tablet Take 25 mg by mouth daily. 09/10/20   [provider]  atorvastatin (LIPITOR) 20 MG tablet TAKE 1 TABLET BY MOUTH ONCE  DAILY 10/30/21   [name], MD  blood glucose meter kit and supplies Dispense based on patient and insurance preference. Use up to two times daily as directed. (FOR ICD-10 E10.9, E11.9). 04/11/21   [name], MD  Budeson-Glycopyrrol-Formoterol (BREZTRI AEROSPHERE) 160-9-4.8 MCG/ACT AERO Inhale 2 puffs into the lungs in the morning and at bedtime. 05/08/21   [name], MD  buPROPion (WELLBUTRIN XL) 300 MG 24 hr tablet Take 1 tablet (300 mg total) by mouth daily. 10/12/21   [name], MD  busPIRone (BUSPAR) 5 MG tablet TAKE 1/2 TO 1 TABLET BY MOUTH  TWICE DAILY AS NEEDED Patient taking differently: Take 5 mg by mouth at bedtime. 08/23/21   [name], MD  Camphor-Menthol-Methyl Sal (SALONPAS)  3.03-02-08 % PTCH Apply 1 patch topically daily as needed.    [provider]  cetirizine (ZYRTEC) 10 MG tablet Take 1 tablet (10 mg total) by mouth 2 (two) times daily. 08/09/21 01/03/22  Valentina Shaggy, MD  cyclobenzaprine (FLEXERIL) 10 MG tablet Take 1 tablet (10 mg total) by mouth at bedtime. 06/12/21   [name], MD  diphenhydrAMINE (BENADRYL) 25 mg capsule Take 25 mg by mouth 2 (two) times daily.    [provider]  fluticasone (FLONASE) 50 MCG/ACT nasal spray Place 2 sprays into both nostrils daily. Patient not taking: Reported on 01/03/2022 09/05/21 12/04/21  Valentina Shaggy, MD  glucose blood test strip Use as instructed 09/10/21   [name], MD  hydrochlorothiazide (HYDRODIURIL) 25 MG tablet TAKE 1 TABLET BY MOUTH DAILY 10/30/21   [name], MD  Ipratropium-Albuterol (COMBIVENT RESPIMAT) 20-100 MCG/ACT AERS respimat Inhale 1 puff into the lungs every 6 (six) hours as needed for wheezing or shortness of breath. 11/01/21   [name], MD  levothyroxine (SYNTHROID) 25 MCG tablet TAKE 1 TABLET BY MOUTH DAILY  BEFORE BREAKFAST 10/30/21   [name], MD  Liniments [facility] ARTHRITIS PAIN RELIEF EX) Apply 1 patch topically daily as needed (to painful sites- remove as directed).     [provider]  montelukast (SINGULAIR) 10 MG tablet TAKE 1 TABLET BY MOUTH AT  BEDTIME 10/17/21   [name], MD  nystatin-triamcinolone ointment [facility]) Apply 1 application topically 2 (two) times daily as needed (applied to skin folds/groins/under breast skin irritation rash.). 09/08/17   [name], MD  OXYGEN Inhale 3 L/min into the lungs See admin instructions. Inhale  3 L/min of oxygen into the lungs w/CPAP at bedtime and as needed for shortness of breath with "strenuous activity" during the day    [provider]  pantoprazole (PROTONIX) 40 MG tablet TAKE 1 TABLET BY MOUTH DAILY 12/21/21   [name],  MD  PATADAY 0.2 % SOLN Apply 1 drop to eye daily. 08/20/19   [provider]  polyethylene glycol (MIRALAX / GLYCOLAX) 17 g packet Take 17 g by mouth daily as needed for mild constipation. 04/14/19   Meuth, Brooke A, PA-C  pregabalin (LYRICA) 300  MG capsule Take 1 capsule (300 mg total) by mouth 2 (two) times daily. 11/26/21   [name], MD  QUEtiapine (SEROQUEL) 50 MG tablet TAKE 1 TABLET BY MOUTH AT  BEDTIME 11/16/21   [name], MD  roflumilast (DALIRESP) 500 MCG TABS tablet Take 1 tablet (500 mcg total) by mouth daily. 11/01/21   [name], MD  silver sulfADIAZINE (SILVADENE) 1 % cream Apply 1 Application topically daily. 08/10/21   Edrick Kins, DPM  traZODone (DESYREL) 100 MG tablet TAKE 1 TABLET BY MOUTH AT  BEDTIME 12/21/21   [name], MD  Varenicline Tartrate, Starter, (CHANTIX STARTING MONTH PAK) 0.5 MG X 11 & 1 MG X 42 TBPK Take 0.5 mg by mouth daily. Patient not taking: Reported on 01/03/2022 11/21/21   [name], MD  venlafaxine XR (EFFEXOR-XR) 150 MG 24 hr capsule TAKE 1 CAPSULE BY MOUTH DAILY  WITH BREAKFAST 12/21/21   [name], MD  witch hazel-glycerin (TUCKS) pad Apply topically as needed for itching. 08/18/20   Samuella Cota, MD  Grant Ruts INHUB 250-50 MCG/ACT AEPB Inhale 1 puff into the lungs 2 (two) times daily. Patient not taking: Reported on 01/03/2022 09/25/21   [provider]    Physical Exam    Vital Signs:  Rilyn Scroggs does not have vital signs available for review today.  Given telephonic nature of communication, physical exam is limited. AAOx3. NAD. Normal affect.  Speech and respirations are unlabored.  Accessory Clinical Findings    None  Assessment & Plan    1.  Preoperative Cardiovascular Risk Assessment: Cervical fusion, Dr. Marcello Moores, neurosurgery and spine      Primary Cardiologist: Quay Burow, MD  Chart reviewed as part of pre-operative protocol coverage. Given past medical  history and time since last visit, based on ACC/AHA guidelines, Symphony Demuro would be at acceptable risk for the planned procedure without further cardiovascular testing.   Her RCRI is a class I risk, 0.4% risk of major cardiac event.  She is able to complete greater than 4 METS of physical activity.  Patient was advised that if she develops new symptoms prior to surgery to contact our office to arrange a follow-up appointment.  She verbalized understanding.  I will route this recommendation to the requesting party via Epic fax function and remove from pre-op pool.       Time:   Today, I have spent 5 minutes with the patient with telehealth technology discussing medical history, symptoms, and management plan.     Deberah Pelton, NP  01/15/2022, 7:28 AM

## 2022-01-15 NOTE — Telephone Encounter (Signed)
Referral placed I am not seeing the fax info for [facility] we will need this to send them the referral. Typically the insurance company would then provide the care through an in-network organization for her home aide if they cover this.

## 2022-01-24 ENCOUNTER — Other Ambulatory Visit: Payer: Self-pay | Admitting: Neurosurgery

## 2022-01-24 ENCOUNTER — Ambulatory Visit (HOSPITAL_COMMUNITY): Payer: Medicare Other | Admitting: Licensed Clinical Social Worker

## 2022-01-24 ENCOUNTER — Encounter (HOSPITAL_COMMUNITY): Payer: Self-pay

## 2022-01-24 NOTE — Progress Notes (Signed)
Therapist contacted patient through My Chart and she did not respond. Session is a no show

## 2022-01-25 ENCOUNTER — Other Ambulatory Visit: Payer: Self-pay | Admitting: Internal Medicine

## 2022-01-25 ENCOUNTER — Other Ambulatory Visit (HOSPITAL_COMMUNITY): Payer: Self-pay | Admitting: Psychiatry

## 2022-01-29 ENCOUNTER — Encounter: Payer: Self-pay | Admitting: Acute Care

## 2022-01-29 ENCOUNTER — Ambulatory Visit (INDEPENDENT_AMBULATORY_CARE_PROVIDER_SITE_OTHER): Payer: Medicare Other | Admitting: Acute Care

## 2022-01-29 VITALS — BP 126/78 | HR 112 | Temp 98.4°F | Ht 68.0 in | Wt 193.6 lb

## 2022-01-29 DIAGNOSIS — J9611 Chronic respiratory failure with hypoxia: Secondary | ICD-10-CM

## 2022-01-29 DIAGNOSIS — Z5181 Encounter for therapeutic drug level monitoring: Secondary | ICD-10-CM | POA: Diagnosis not present

## 2022-01-29 DIAGNOSIS — F17210 Nicotine dependence, cigarettes, uncomplicated: Secondary | ICD-10-CM

## 2022-01-29 DIAGNOSIS — G4733 Obstructive sleep apnea (adult) (pediatric): Secondary | ICD-10-CM

## 2022-01-29 DIAGNOSIS — J449 Chronic obstructive pulmonary disease, unspecified: Secondary | ICD-10-CM | POA: Diagnosis not present

## 2022-01-29 LAB — COMPREHENSIVE METABOLIC PANEL
ALT: 16 U/L (ref 0–35)
AST: 15 U/L (ref 0–37)
Albumin: 4.3 g/dL (ref 3.5–5.2)
Alkaline Phosphatase: 129 U/L — ABNORMAL HIGH (ref 39–117)
BUN: 19 mg/dL (ref 6–23)
CO2: 31 mEq/L (ref 19–32)
Calcium: 9.6 mg/dL (ref 8.4–10.5)
Chloride: 94 mEq/L — ABNORMAL LOW (ref 96–112)
Creatinine, Ser: 1.04 mg/dL (ref 0.40–1.20)
GFR: 58.53 mL/min — ABNORMAL LOW (ref >60.00)
Glucose, Bld: 128 mg/dL — ABNORMAL HIGH (ref 70–99)
Potassium: 3.4 mEq/L — ABNORMAL LOW (ref 3.5–5.1)
Sodium: 135 mEq/L (ref 135–145)
Total Bilirubin: 0.5 mg/dL (ref 0.2–1.2)
Total Protein: 7.5 g/dL (ref 6.0–8.3)

## 2022-01-29 NOTE — Patient Instructions (Addendum)
It is good to see you today. Try AYR nasal saline for your nasal dryness.  Continue the Breztri 2 puffs twice daily Rinse mouth after use. Continue Combivent  as needed for shortness or wheezing. Continue Daliresp.  We will check labs today.  We will call you with the results Continue oxygen at 3 L [place].  Saturation goal is > 88% at all times.  You have re qualified for your oxygen.  We will send this note into the DME.  Please work on quitting smoking.  Her insurance does not pay for Chantix.  Call 1-800-QUIT now. Continue CPAP as you have been doing. You appear to be benefiting from the treatment >> Down Load looks great.  Goal is to wear for at least 6 hours each night for maximal clinical benefit. Continue to work on weight loss, as the link between excess weight  and sleep apnea is well established.   Remember to establish a good bedtime routine, and work on sleep hygiene.  Limit daytime naps , avoid stimulants such as caffeine and nicotine close to bedtime, exercise daily to promote sleep quality, avoid heavy , spicy, fried , or rich foods before bed. Ensure adequate exposure to natural light during the day,establish a relaxing bedtime routine with a pleasant sleep environment ( Bedroom between 60 and 67 degrees, turn off bright lights , TV or device screens screens , consider black out curtains or white noise machines) Do not drive if sleepy. Remember to clean mask, tubing, filter, and reservoir once weekly with soapy water.  Follow up with Dr. Elsworth Soho or Judson Roch NP  In 3 months or before as needed.  Please contact office for sooner follow up if symptoms do not improve or worsen or seek emergency care

## 2022-01-29 NOTE — Progress Notes (Unsigned)
History of Present Illness Joan [name] is a 61 y.o. female current every day smoker ( 44++  pack year smoking history) with severe COPD and OSA on CPAP therapy ( 10 cm H2O). Meds -allergy listed to prednisone-this caused pedal edema, prefers Medrol when needed . She is followed by Joan [name].   Maintenance is Breztri CPAP: AutoCPAP DME: [employer] Mod[facility]:  Pressure: 5-12cm  Mask Brand and Size: ResMed Full Face Mask, Small, F20    02/07/2021 hfb   SATURATION QUALIFICATIONS: (This note is used to comply with regulatory documentation for home oxygen) Patient Saturations on Room Air at Rest = 96% Patient Saturations on Room Air while Ambulating = 87% Patient Saturations on 3 Liters of oxygen while Ambulating = 95%   01/30/2022 Pt. Presents for 3 month  follow up, and oxygen qualification walk. Marland Kitchen She was last seen by Joan [name] 11/21/2021. He prescribed her Chantix at that time to help her quit smoking. She presents again today in a wheelchair. She is wearing her oxygen at 3 L [place]. Sats are 95%. She has her Inogen, and is very happy with it.  She is on [employer]. She uses Combivent as her rescue. She is using the Combine 3-4 times a day before activity.  She is compliant with her Daliresp.  We will do a c-Met today to evaluate liver function testing and renal labs as therapeutic drug monitoring. She has lost weight 6 pounds. She is due for lung cancer screening 04/2022.  Pt. Re-qualified for her oxygen. We took her oxygen off, and after 5 minutes checked her sats at rest. She dropped to 87%. We re-applied her nasal cannula. She states she does have a cough and she is not  wheezing. Secretions are yellow to white, no fever, she does not need antibiotics.  We did turn off her oxygen at rest, and she dropped her sats to 87%. We documented continued need for her oxygen. This should serve to support oxygen orders to DME.  She is compliant with her CPAP therapy.  She has good compliance and therapeutic  benefit from treatment.  AHI is 1.   Test Results: Down Load shows 77% compliance. ( 12/28/2021-01-26-2022) Set pressure of 10 cm H2O.  AHI is 1.0 Down Load 12/28/2021-01/26/2022       NPSG 02/2017 >> no OSA HST  04/2017 15/h     02/2019 pFTS >> RATIO 45, fev 1 37% - UNCHANGED , + bd response, DLCO 44%   10/2016 PFTs showed ratio 49, FEV1 of 36%, FVC 58% and DLCO of 34% consistent with severe obstruction.   04/2021 LDCT >> stable, RADS 2   02/2020 LDCT >>  stable nodules  11/2016 CT chest bullous changes BL, 28m RUl & 2 mm LUL  nodules >> stable      ONO 11/2016 on CPAP/room air showed desaturation for 1 hour 45 minutes started on nocturnal oxygen>>     Latest Ref Rng & Units 06/11/2021    3:05 PM 04/04/2021    3:23 AM 04/02/2021    5:56 PM  CBC  WBC 4.0 - 10.5 K/uL 8.5  15.4  9.0   Hemoglobin 12.0 - 15.0 g/dL 12.0  11.6  11.0   Hematocrit 36.0 - 46.0 % 36.0  36.1  34.1   Platelets 150.0 - 400.0 K/uL 261.0  279  273        Latest Ref Rng & Units 01/29/2022    3:26 PM 06/11/2021    3:05 PM 04/05/2021  4:02 AM  BMP  Glucose 70 - 99 mg/dL 128  197  343   BUN 6 - 23 mg/dL _0 Creatinine 0.40 - 1.20 mg/dL 1.04  1.04  0.95   Sodium 135 - 145 mEq/L 135  137  133   Potassium 3.5 - 5.1 mEq/L 3.4  4.0  4.9   Chloride 96 - 112 mEq/L 94  98  94   CO2 19 - 32 mEq/L _1 Calcium 8.4 - 10.5 mg/dL 9.6  9.2  9.4     BNP    Component Value Date/Time   BNP 55.2 03/13/2021 2251    ProBNP No results found for: "PROBNP"  PFT    Component Value Date/Time   FEV1PRE 1.06 03/17/2019 1444   FEV1POST 1.24 03/17/2019 1444   FVCPRE 2.35 03/17/2019 1444   FVCPOST 2.66 03/17/2019 1444   TLC 5.72 03/17/2019 1444   DLCOUNC 9.80 03/17/2019 1444   PREFEV1FVCRT 45 03/17/2019 1444   PSTFEV1FVCRT 47 03/17/2019 1444    No results found.   Past medical hx Past Medical History:  Diagnosis Date   Acute respiratory failure with hypoxia ([place])    Alcoholism and drug addiction  in family    Anxiety    Appendicitis    Arthritis    "knees, hips, lower back" (09/25/2016)   Asthma    Bipolar disorder ([place])    Chronic bronchitis ([place])    "all the time" (09/25/2016)   Chronic leg pain    RLE   Chronic lower back pain    COPD (chronic obstructive pulmonary disease) ([place])    Depression    Diabetes mellitus without complication (HCC)    Diabetic neuropathy ([place])    Early cataracts, bilateral    Emphysema of lung ([place])    Generalized anxiety disorder 02/09/2020   GERD (gastroesophageal reflux disease)    Headache    Hepatitis C    "treated back in 2001-2002"   High cholesterol    Hypertension    Hypothyroidism    Incarcerated ventral hernia 04/04/2017   Mood disorder in conditions classified elsewhere 02/09/2020   On home oxygen therapy    "2L; at nighttime" (04/04/2017)   OSA on CPAP    CPAP with Oxygen 2 liters   Paranoid ([place])    Pneumonia    "all the time" (09/25/2016)   Positive PPD    with negative chest x ray   PTSD (post-traumatic stress disorder) 02/09/2020   Tachycardia    patient reports with exertion ; denies any other conditions    Thyroid disease    Wears glasses    Wears partial dentures      Social History   Tobacco Use   Smoking status: Every Day    Packs/day: 1.00    Years: 44.00    Total pack years: 44.00    Types: Cigarettes    Passive exposure: Current   Smokeless tobacco: Never   Tobacco comments:    1 cig per day 01/29/22  Vaping Use   Vaping Use: Never used  Substance Use Topics   Alcohol use: Not Currently   Drug use: Not Currently    Types: "Crack" cocaine    Comment:  "nothing since 01/12/2012"    [name] reports that she has been smoking cigarettes. She has a 44.00 pack-year smoking history. She has been exposed to tobacco smoke. She has never used smokeless tobacco. She reports that she does not currently  use alcohol. She reports that she does not currently use drugs after having used the following drugs: "Crack"  cocaine.  Tobacco Cessation: Current every day smoker , 44++ pack year smoking history  Past surgical hx, Family hx, Social hx all reviewed.  Current Outpatient Medications on File Prior to Visit  Medication Sig   Accu-Chek Softclix Lancets lancets Use as instructed   acyclovir (ZOVIRAX) 400 MG tablet TAKE 1 TABLET BY MOUTH  TWICE DAILY (Patient taking differently: 400 mg in the morning.)   albuterol (PROVENTIL) (2.5 MG/3ML) 0.083% nebulizer solution Take 3 mLs (2.5 mg total) by nebulization 2 (two) times daily. And every 6 hours as needed.  DX: J44.9   atenolol (TENORMIN) 25 MG tablet TAKE 1 TABLET BY MOUTH  DAILY   atorvastatin (LIPITOR) 20 MG tablet TAKE 1 TABLET BY MOUTH ONCE  DAILY   blood glucose meter kit and supplies Dispense based on patient and insurance preference. Use up to two times daily as directed. (FOR ICD-10 E10.9, E11.9).   Budeson-Glycopyrrol-Formoterol (BREZTRI AEROSPHERE) 160-9-4.8 MCG/ACT AERO Inhale 2 puffs into the lungs in the morning and at bedtime.   buPROPion (WELLBUTRIN XL) 300 MG 24 hr tablet Take 1 tablet (300 mg total) by mouth daily.   busPIRone (BUSPAR) 5 MG tablet TAKE 1/2 TO 1 TABLET BY MOUTH  TWICE DAILY AS NEEDED (Patient taking differently: Take 5 mg by mouth at bedtime.)   Camphor-Menthol-Methyl Sal (SALONPAS) 3.03-02-08 % PTCH Apply 1 patch topically daily as needed.   cyclobenzaprine (FLEXERIL) 10 MG tablet TAKE 1 TABLET BY MOUTH AT  BEDTIME   diphenhydrAMINE (BENADRYL) 25 mg capsule Take 25 mg by mouth 2 (two) times daily.   glucose blood test strip Use as instructed   hydrochlorothiazide (HYDRODIURIL) 25 MG tablet TAKE 1 TABLET BY MOUTH DAILY   Ipratropium-Albuterol (COMBIVENT RESPIMAT) 20-100 MCG/ACT AERS respimat Inhale 1 puff into the lungs every 6 (six) hours as needed for wheezing or shortness of breath.   levothyroxine (SYNTHROID) 25 MCG tablet TAKE 1 TABLET BY MOUTH DAILY  BEFORE BREAKFAST   Liniments (SALONPAS ARTHRITIS PAIN RELIEF EX) Apply  1 patch topically daily as needed (to painful sites- remove as directed).    montelukast (SINGULAIR) 10 MG tablet TAKE 1 TABLET BY MOUTH AT  BEDTIME   nystatin-triamcinolone ointment (MYCOLOG) Apply 1 application topically 2 (two) times daily as needed (applied to skin folds/groins/under breast skin irritation rash.).   OXYGEN Inhale 3 L/min into the lungs See admin instructions. Inhale  3 L/min of oxygen into the lungs w/CPAP at bedtime and as needed for shortness of breath with "strenuous activity" during the day   pantoprazole (PROTONIX) 40 MG tablet TAKE 1 TABLET BY MOUTH DAILY   PATADAY 0.2 % SOLN Apply 1 drop to eye daily.   polyethylene glycol (MIRALAX / GLYCOLAX) 17 g packet Take 17 g by mouth daily as needed for mild constipation.   pregabalin (LYRICA) 300 MG capsule Take 1 capsule (300 mg total) by mouth 2 (two) times daily.   QUEtiapine (SEROQUEL) 50 MG tablet TAKE 1 TABLET BY MOUTH AT  BEDTIME   roflumilast (DALIRESP) 500 MCG TABS tablet Take 1 tablet (500 mcg total) by mouth daily.   silver sulfADIAZINE (SILVADENE) 1 % cream Apply 1 Application topically daily.   traZODone (DESYREL) 100 MG tablet TAKE 1 TABLET BY MOUTH AT  BEDTIME   Varenicline Tartrate, Starter, (CHANTIX STARTING MONTH PAK) 0.5 MG X 11 & 1 MG X 42 TBPK Take 0.5 mg by mouth daily.  venlafaxine XR (EFFEXOR-XR) 150 MG 24 hr capsule TAKE 1 CAPSULE BY MOUTH DAILY  WITH BREAKFAST   witch hazel-glycerin (TUCKS) pad Apply topically as needed for itching.   WIXELA INHUB 250-50 MCG/ACT AEPB Inhale 1 puff into the lungs 2 (two) times daily.   cetirizine (ZYRTEC) 10 MG tablet Take 1 tablet (10 mg total) by mouth 2 (two) times daily.   fluticasone (FLONASE) 50 MCG/ACT nasal spray Place 2 sprays into both nostrils daily. (Patient not taking: Reported on 01/03/2022)   No current facility-administered medications on file prior to visit.     Allergies  Allergen Reactions   Keflex [Cephalexin] Rash and Other (See Comments)     Has tolerated amoxicillin since had keflex   Shellfish Allergy Shortness Of Breath, Nausea And Vomiting and Other (See Comments)    Stomach cramps   Tetracyclines & Related Anaphylaxis, Swelling and Other (See Comments)    Throat swelling requiring hospitalization   Dilaudid [Hydromorphone Hcl] Other (See Comments)    "Lethargy"   Prednisone Other (See Comments)    "counter reacts", has tolerated IV medrol   Clindamycin/Lincomycin Other (See Comments)    Burning sensation in her mouth down her throat    Incruse Ellipta [Umeclidinium Bromide] Nausea Only   Molds & Smuts     itchy   Other    Tuberculin Tests Other (See Comments)    False postive    Review Of Systems:  Constitutional:   + intentional   weight loss, No night sweats,  Fevers, chills, fatigue, or  lassitude.  HEENT:   No headaches,  Difficulty swallowing,  Tooth/dental problems, or  Sore throat,                No sneezing, itching, ear ache, nasal congestion, post nasal drip,   CV:  No chest pain,  Orthopnea, PND, swelling in lower extremities, anasarca, dizziness, palpitations, syncope.   GI  No heartburn, indigestion, abdominal pain, nausea, vomiting, diarrhea, change in bowel habits, loss of appetite, bloody stools.   Resp: + shortness of breath with exertion less  at rest.  + baseline  excess mucus, + baseline  productive cough,  No non-productive cough,  No coughing up of blood.  No change in color of mucus.  Occasional  wheezing.  No chest wall deformity  Skin: no rash or lesions.  GU: no dysuria, change in color of urine, no urgency or frequency.  No flank pain, no hematuria   MS:  No joint pain or swelling.  No decreased range of motion.  No back pain.  Psych:  No change in mood or affect. No depression or anxiety.  No memory loss.   Vital Signs BP 126/78 (BP Location: Right Arm, Cuff Size: Normal)   Pulse (!) 112   Temp 98.4 F (36.9 C) (Oral)   Ht _0  (1.727 m)   Wt 193 lb 9.6 oz (87.8 kg)   SpO2  95%   BMI 29.44 kg/m    Physical Exam:  General- No distress,  A&Ox3, in wheelchair, pleasant ENT: No sinus tenderness, TM clear, pale nasal mucosa, no oral exudate,no post nasal drip, no LAN Cardiac: S1, S2, regular rate and rhythm, no murmur Chest: No wheeze/ rales/ dullness; no accessory muscle use, no nasal flaring, no sternal retractions, diminished in the bases but otherwise clear Abd.: Soft Non-tender, ND, BS +, Body mass index is 29.44 kg/m. Ext: No clubbing cyanosis, edema, no obvious deformities Neuro:  normal strength, MAE x 4, A&O x  3 Skin: No rashes, warm and dry, no lesions  Psych: normal mood and behavior   Assessment/Plan  Severe COPD Chronic hypoxic respiratory failure Former smoker with a 44 pack year smoking history Daliresp  therapy OSA on CPAP Cervical surgery scheduled for 02/14/2022 by Dr. Dorcas Carrow. Marcello Moores, MD Plan Try AYR nasal saline for your nasal dryness.  Continue the Breztri 2 puffs twice daily Rinse mouth after use. Continue Combivent  as needed for shortness or wheezing. Continue Daliresp.  We will check labs today.  We will call you with the results Continue oxygen at 3 L [place].  Saturation goal is > 88% at all times.  You have re qualified for your oxygen.  We will send this note into the DME.  Please work on quitting smoking.  Her insurance does not pay for Chantix.  Call 1-800-QUIT now. Continue CPAP as you have been doing. You appear to be benefiting from the treatment >> Down Load looks great.  Goal is to wear for at least 6 hours each night for maximal clinical benefit. Continue to work on weight loss, as the link between excess weight  and sleep apnea is well established.   Remember to establish a good bedtime routine, and work on sleep hygiene.  Limit daytime naps , avoid stimulants such as caffeine and nicotine close to bedtime, exercise daily to promote sleep quality, avoid heavy , spicy, fried , or rich foods before bed. Ensure  adequate exposure to natural light during the day,establish a relaxing bedtime routine with a pleasant sleep environment ( Bedroom between 60 and 67 degrees, turn off bright lights , TV or device screens screens , consider black out curtains or white noise machines) Do not drive if sleepy. Remember to clean mask, tubing, filter, and reservoir once weekly with soapy water.  Follow up with Joan [name] or Judson Roch NP  In 3 months or before as needed.  Good luck with your pending surgery Call us if you need anything from a respiratory standpoint Saturations should be maintained > 92% at all times during sirgery Please contact office for sooner follow up if symptoms do not improve or worsen or seek emergency care        I spent 40 minutes dedicated to the care of this patient on the date of this encounter to include pre-visit review of records, face-to-face time with the patient discussing conditions above, post visit ordering of testing, clinical documentation with the electronic health record, making appropriate referrals as documented, and communicating necessary information to the patient's healthcare team.     [name], NP 01/30/2022  1:13 PM

## 2022-01-31 ENCOUNTER — Encounter: Payer: Self-pay | Admitting: Acute Care

## 2022-01-31 ENCOUNTER — Telehealth: Payer: Self-pay | Admitting: Acute Care

## 2022-01-31 NOTE — Telephone Encounter (Signed)
Please let patient know her labs showed a slight increase in her Alk Phos. This looks like she has had this , so chronic problem. Value from most recent blood draw is actually down a bit from previous ( 129 from 142 7 months ago). Her glucose is also high. She should follow up with her PCP about her blood sugar and we will do another level at her 3 month follow up as a therapeutic drug monitoring.

## 2022-01-31 NOTE — Telephone Encounter (Signed)
Called and left voicemail for patient to call office back  

## 2022-02-07 ENCOUNTER — Ambulatory Visit (INDEPENDENT_AMBULATORY_CARE_PROVIDER_SITE_OTHER): Payer: Medicare Other | Admitting: Licensed Clinical Social Worker

## 2022-02-07 DIAGNOSIS — F0630 Mood disorder due to known physiological condition, unspecified: Secondary | ICD-10-CM

## 2022-02-07 DIAGNOSIS — F411 Generalized anxiety disorder: Secondary | ICD-10-CM | POA: Diagnosis not present

## 2022-02-07 DIAGNOSIS — R443 Hallucinations, unspecified: Secondary | ICD-10-CM

## 2022-02-07 DIAGNOSIS — F4310 Post-traumatic stress disorder, unspecified: Secondary | ICD-10-CM

## 2022-02-07 NOTE — Pre-Procedure Instructions (Signed)
Surgical Instructions    Your procedure is scheduled on February 14, 2022.  Report to [facility] Main Entrance "A" at 5:30 A.M., then check in with the Admitting office.  Call this number if you have problems the morning of surgery:  [phone]   If you have any questions prior to your surgery date call [phone]: Open Monday-Friday 8am-4pm    Remember:  Do not eat or drink after midnight the night before your surgery    Take these medicines the morning of surgery with A SIP OF WATER:  acyclovir (ZOVIRAX)   atenolol (TENORMIN)   atorvastatin (LIPITOR)   Budeson-Glycopyrrol-Formoterol (BREZTRI AEROSPHERE)   buPROPion (WELLBUTRIN XL)   cetirizine (ZYRTEC)   diphenhydrAMINE (BENADRYL)   levothyroxine (SYNTHROID)   pantoprazole (PROTONIX)   pregabalin (LYRICA)   venlafaxine XR (EFFEXOR-XR)   roflumilast (DALIRESP)    May take as needed:  albuterol (PROVENTIL) nebulizer solution   fluticasone (FLONASE) nasal spray   Ipratropium-Albuterol (COMBIVENT RESPIMAT)    As of today, STOP taking any Aspirin (unless otherwise instructed by your surgeon) Aleve, Naproxen, Ibuprofen, Motrin, Advil, Goody's, BC's, all herbal medications, fish oil, and all vitamins.           WHAT DO I DO ABOUT MY DIABETES MEDICATION?   Do not take oral diabetes medicines (pills) the morning of surgery.   HOW TO MANAGE YOUR DIABETES BEFORE AND AFTER SURGERY  Why is it important to control my blood sugar before and after surgery? Improving blood sugar levels before and after surgery helps healing and can limit problems. A way of improving blood sugar control is eating a healthy diet by:  Eating less sugar and carbohydrates  Increasing activity/exercise  Talking with your doctor about reaching your blood sugar goals High blood sugars (greater than 180 mg/dL) can raise your risk of infections and slow your recovery, so you will need to focus on controlling your diabetes during the weeks before  surgery. Make sure that the doctor who takes care of your diabetes knows about your planned surgery including the date and location.  How do I manage my blood sugar before surgery? Check your blood sugar at least 4 times a day, starting 2 days before surgery, to make sure that the level is not too high or low.  Check your blood sugar the morning of your surgery when you wake up and every 2 hours until you get to the Short Stay unit.  If your blood sugar is less than 70 mg/dL, you will need to treat for low blood sugar: Do not take insulin. Treat a low blood sugar (less than 70 mg/dL) with  cup of clear juice (cranberry or apple), 4 glucose tablets, OR glucose gel. Recheck blood sugar in 15 minutes after treatment (to make sure it is greater than 70 mg/dL). If your blood sugar is not greater than 70 mg/dL on recheck, call [phone] for further instructions. Report your blood sugar to the short stay nurse when you get to Short Stay.  If you are admitted to the hospital after surgery: Your blood sugar will be checked by the staff and you will probably be given insulin after surgery (instead of oral diabetes medicines) to make sure you have good blood sugar levels. The goal for blood sugar control after surgery is 80-180 mg/dL.             Do NOT Smoke (Tobacco/Vaping) for 24 hours prior to your procedure.  If you use a CPAP at night, you may  bring your mask/headgear for your overnight stay.   Contacts, glasses, piercing's, hearing aid's, dentures or partials may not be worn into surgery, please bring cases for these belongings.    For patients admitted to the hospital, discharge time will be determined by your treatment team.   Patients discharged the day of surgery will not be allowed to drive home, and someone needs to stay with them for 24 hours.  SURGICAL WAITING ROOM VISITATION Patients having surgery or a procedure may have no more than 2 support people in the waiting area -  these visitors may rotate.   Children under the age of 18 must have an adult with them who is not the patient. If the patient needs to stay at the hospital during part of their recovery, the visitor guidelines for inpatient rooms apply. Pre-op nurse will coordinate an appropriate time for 1 support person to accompany patient in pre-op.  This support person may not rotate.   Please refer to the [facility] website for the visitor guidelines for Inpatients (after your surgery is over and you are in a regular room).    Special instructions:   [facility]- Preparing For Surgery  Before surgery, you can play an important role. Because skin is not sterile, your skin needs to be as free of germs as possible. You can reduce the number of germs on your skin by washing with CHG (chlorahexidine gluconate) Soap before surgery.  CHG is an antiseptic cleaner which kills germs and bonds with the skin to continue killing germs even after washing.    Oral Hygiene is also important to reduce your risk of infection.  Remember - BRUSH YOUR TEETH THE MORNING OF SURGERY WITH YOUR REGULAR TOOTHPASTE  Please do not use if you have an allergy to CHG or antibacterial soaps. If your skin becomes reddened/irritated stop using the CHG.  Do not shave (including legs and underarms) for at least 48 hours prior to first CHG shower. It is OK to shave your face.  Please follow these instructions carefully.   Shower the NIGHT BEFORE SURGERY and the MORNING OF SURGERY  If you chose to wash your hair, wash your hair first as usual with your normal shampoo.  After you shampoo, rinse your hair and body thoroughly to remove the shampoo.  Use CHG Soap as you would any other liquid soap. You can apply CHG directly to the skin and wash gently with a scrungie or a clean washcloth.   Apply the CHG Soap to your body ONLY FROM THE NECK DOWN.  Do not use on open wounds or open sores. Avoid contact with your eyes, ears, mouth and  genitals (private parts). Wash Face and genitals (private parts)  with your normal soap.   Wash thoroughly, paying special attention to the area where your surgery will be performed.  Thoroughly rinse your body with warm water from the neck down.  DO NOT shower/wash with your normal soap after using and rinsing off the CHG Soap.  Pat yourself dry with a CLEAN TOWEL.  Wear CLEAN PAJAMAS to bed the night before surgery  Place CLEAN SHEETS on your bed the night before your surgery  DO NOT SLEEP WITH PETS.   Day of Surgery: Take a shower with CHG soap. Do not wear jewelry or makeup Do not wear lotions, powders, perfumes/colognes, or deodorant. Do not shave 48 hours prior to surgery.  Men may shave face and neck. Do not bring valuables to the hospital.  [facility]  is not responsible for any belongings or valuables. Do not wear nail polish, gel polish, artificial nails, or any other type of covering on natural nails (fingers and toes) If you have artificial nails or gel coating that need to be removed by a nail salon, please have this removed prior to surgery. Artificial nails or gel coating may interfere with anesthesia's ability to adequately monitor your vital signs.  Wear Clean/Comfortable clothing the morning of surgery Remember to brush your teeth WITH YOUR REGULAR TOOTHPASTE.   Please read over the following fact sheets that you were given.    If you received a COVID test during your pre-op visit  it is requested that you wear a mask when out in public, stay away from anyone that may not be feeling well and notify your surgeon if you develop symptoms. If you have been in contact with anyone that has tested positive in the last 10 days please notify you surgeon.

## 2022-02-07 NOTE — Progress Notes (Signed)
Virtual Visit via Video Note  I connected with Joan [name] on 02/07/22 at  2:00 PM EST by a video enabled telemedicine application and verified that I am speaking with the correct person using two identifiers.  Location: Patient: home Provider: home office   I discussed the limitations of evaluation and management by telemedicine and the availability of in person appointments. The patient expressed understanding and agreed to proceed.   I discussed the assessment and treatment plan with the patient. The patient was provided an opportunity to ask questions and all were answered. The patient agreed with the plan and demonstrated an understanding of the instructions.   The patient was advised to call back or seek an in-person evaluation if the symptoms worsen or if the condition fails to improve as anticipated.  I provided 53 minutes of non-face-to-face time during this encounter.  THERAPIST PROGRESS NOTE  Session Time: 2:00 PM to 2:53 PM  Participation Level: Active  Behavioral Response: CasualAlertEuthymic  Type of Therapy: Individual Therapy  Treatment Goals addressed: Work on trauma, anxiety, depression, coping  ProgressTowards Goals: Progressing-patient narrated some of past history feel helpful and working through issues also reframing to look at how things turned around in a positive way  Interventions: Solution Focused, Play Therapy, Supportive, Reframing, and Other: coping  Summary: Joan [name] is a 61 y.o. female who presents with celebrating before Christmas church so people could be with family. The Friday before Christmas they are going to be open all night long. Saturday there is a play all the kids from school going to go to that. Made a friend and her son is in it. Sunday the kids are going to do something and will be there see what is going on. Sometimes goes back on Sunday night. Does Tuesday which is class and Wednesday "here and there" the church  starts up on Saturday goes regularly Sunday morning. This Saturday going holding it a refuge it is a Geologist, engineering. For Thanksgiving people came and picked up food and later that night delivered coats. Therapist impressed and noted that her kindness and goodness impacts her. Was going to church then didn't go a couple of weeks. Incident happened and took offense. The girl preaching was talking about fake people and kept staring at patient.  Therapist asked if she went up and asked her patient said just needed to go and pull herself together if didn't get head right not come out right. Went and talked to her Mom. Mom said wouldn't do something like that. The following Sunday asked her and said never would do that to her.  In  retrospect think could have been her paranoia sisters invited her to dinner at Christmas. Likes living alone now used to it. Before mind kept going and told herself sit still after her husband passed couldn't sit still. Will be 13 years this August he is gone. Gave everything away in [place] she means to drugs. Thinks his friend was a big part of why patient in jail. He died in sleep they said of massive heart attack. Devastating for patient and her family washed their hands. Brother kicked out of house because using. Packed all her stuff, older brother Richard. Patched up things a long time ago. Went to drug house didn't know where to do. Tired of stealing, drugs, life living, renting her car out the things she did Taught her how to make money how lived. Stayed like that for a good year. Packed up her  car and never went back. Went to older sister's [place]. Said done and need help when knew was serious. Let her spend the night there. But her boyfriend kept coming in living room talking "shit". Wanted to go to a program went to the hospital. After the program went to another one. There for months. 2nd one about 5 months. Patient felt good and not a struggle. Didn't have car when had car  and go to [place] then done and relapsed. Bounced from [place], [place] up and down [employer] remember a lot don't know how quit but [name] involved. Didn't continue to use felt guilty and didn't want to bother with it. Know [name] involved because addiction bad. Get confused and won't remember what happened.Told doctor. Said scan on her and said she fine. Shared narrative about friend who was going with her to [name] and left her in [place] she wanted to visit with dying uncle. Just want to party wouldn't go with patient so patient said then she will be stuck in [place].  And ending session therapist reframe to positive noting patient turn her life around and is in a lot better place right now.  Therapist able to have patient narrate history which is important part of her therapy talking about working through difficult experiences processing her thoughts and feelings.  Talked about after loss of her husband being on the streets and how difficult that was.  Difficult as well that she was in jail and lost her husband.  Noted through patient's experiences her strengths resources and resiliency she is the one who went for help and able to change her life.  Therapist impressed with patient's strong spirituality and acts of kindness she has been doing truly practicing her spirituality and therapist noted not only benefits the people but also reports patient in different ways surely it strengthens her character.  Talked about Christmas plans positive patient engaged with charge patient also says getting used to being alone so that is a positive experience for her as well.  Therapist provided active listening open questions supportive interventions.     Suicidal/Homicidal: No  Plan: Return again in 2 weeks.2.Start trauma work, process feelings related to stressors  Diagnosis: PTSD, hallucinations, GAD, Mood Disorder in conditions classified elsewhere  Collaboration of Care: Other none  needed  Patient/Guardian was advised Release of Information must be obtained prior to any record release in order to collaborate their care with an outside provider. Patient/Guardian was advised if they have not already done so to contact the registration department to sign all necessary forms in order for [place] to release information regarding their care.   Consent: Patient/Guardian gives verbal consent for treatment and assignment of benefits for services provided during this visit. Patient/Guardian expressed understanding and agreed to proceed.   [name], LCSW 02/07/2022

## 2022-02-08 ENCOUNTER — Encounter (HOSPITAL_COMMUNITY): Payer: Self-pay

## 2022-02-08 ENCOUNTER — Encounter (HOSPITAL_COMMUNITY)
Admission: RE | Admit: 2022-02-08 | Discharge: 2022-02-08 | Disposition: A | Discharge status: Home / Self Care | Payer: Medicare Other | Source: Ambulatory Visit | Attending: Neurosurgery | Admitting: Neurosurgery

## 2022-02-08 ENCOUNTER — Other Ambulatory Visit: Payer: Self-pay

## 2022-02-08 VITALS — BP 111/56 | HR 80 | Temp 98.0°F | Resp 19 | Ht 68.0 in | Wt 196.0 lb

## 2022-02-08 DIAGNOSIS — Z01818 Encounter for other preprocedural examination: Secondary | ICD-10-CM

## 2022-02-08 DIAGNOSIS — Z01812 Encounter for preprocedural laboratory examination: Secondary | ICD-10-CM | POA: Insufficient documentation

## 2022-02-08 LAB — CBC
HCT: 38.3 % (ref 36.0–46.0)
Hemoglobin: 12.7 g/dL (ref 12.0–15.0)
MCH: 29.5 pg (ref 26.0–34.0)
MCHC: 33.2 g/dL (ref 30.0–36.0)
MCV: 88.9 fL (ref 80.0–100.0)
Platelets: 266 10*3/uL (ref 150–400)
RBC: 4.31 MIL/uL (ref 3.87–5.11)
RDW: 14.9 % (ref 11.5–15.5)
WBC: 20.4 10*3/uL — ABNORMAL HIGH (ref 4.0–10.5)
nRBC: 0 % (ref 0.0–0.2)

## 2022-02-08 LAB — SURGICAL PCR SCREEN
MRSA, PCR: NEGATIVE
Staphylococcus aureus: NEGATIVE

## 2022-02-08 LAB — TYPE AND SCREEN
ABO/RH(D): B POS
Antibody Screen: NEGATIVE

## 2022-02-08 NOTE — Progress Notes (Signed)
PCP - Pricilla Holm MD Cardiologist - Quay Burow MD Pulmonologist - [name]  PPM/ICD - denies Device Orders -  Rep Notified -   Chest x-ray - 04/02/21 EKG - 04/17/21 Stress Test - 08/19/17 ECHO - 08/11/20 Cardiac Cath - none  Sleep Study - 03/23/2017 CPAP - yes with 3 liters O2  Fasting Blood Sugar - pt states she is a "steroid induced diabetic" only. She dies not check her sugar at home and she does not take any diabetes meds,  Checks Blood Sugar _____ times a day  Last dose of GLP1 agonist-  na GLP1 instructions:   Blood Thinner Instructions: Aspirin Instructions:  ERAS Protcol -no PRE-SURGERY Ensure or G2-   COVID TEST- na   Anesthesia review: yes-home O2-3 liters;WBC's 20.4,  Patient denies shortness of breath, fever, cough and chest pain at PAT appointment   All instructions explained to the patient, with a verbal understanding of the material. Patient agrees to go over the instructions while at home for a better understanding. Patient also instructed to wear a mask when out in public prior to surgery. The opportunity to ask questions was provided.

## 2022-02-08 NOTE — Progress Notes (Signed)
[employer] sent to Dr. Marcello Moores informing him of pt's WBC's of 20.4 on CBC drawn today at PAT.

## 2022-02-09 LAB — HEMOGLOBIN A1C
Hgb A1c MFr Bld: 7 % — ABNORMAL HIGH (ref 4.8–5.6)
Mean Plasma Glucose: 154 mg/dL

## 2022-02-11 NOTE — Progress Notes (Signed)
Left secure voicemail with Dr. Manon Hilding scheduler,Nikki,informing her of pt's WBC 20.4 at pt's PAT appt on 12/15.

## 2022-02-11 NOTE — Progress Notes (Signed)
Anesthesia Chart Review:  Follows with cardiology for hx of HTN, atypical chest pain, HLD. Nonischemic nuclear stress 2019. Coronary CT without significant obstruction. Last seen by Coletta Memos, NP 01/15/22 for preop eval. Per note, "Chart reviewed as part of pre-operative protocol coverage. Given past medical history and time since last visit, based on ACC/AHA guidelines, Joan [name] would be at acceptable risk for the planned procedure without further cardiovascular testing. Her RCRI is a class I risk, 0.4% risk of major cardiac event.  She is able to complete greater than 4 METS of physical activity."  Follows with pulmonology for hx of severe COPD, chronic hypoxic respiratory failure, former smoker, OSA on CPAP. She is maintained on Breztri, Daliresp, Combivent PRN, Continuous O2 3L, Seen by Eric Form 01/29/22, stable at that time, no changes, upcoming c-spine surgery discussed.   NIDDM2, A1c 7.0 on preop labs.  Proep labs reviewed, potassium mildly low at 3.4, WBC elevated at 20.4 - this result was communicated to surgeon by Alba Cory, RN.  EKG 04/17/2021: NSR. Rate 69. Nonspecific T wave abnormality.  CT Lung cancer screening 05/01/2021: IMPRESSION: 1. Lung-RADS 2, benign appearance or behavior. Continue annual screening with low-dose chest CT without contrast in 12 months. 2. Two-vessel coronary atherosclerosis. 3. Aortic Atherosclerosis (ICD10-I70.0) and Emphysema (ICD10-J43.9).  TTE 08/11/20:  1. Left ventricular ejection fraction, by estimation, is 60 to 65%. The  left ventricle has normal function. The left ventricle has no regional  wall motion abnormalities. Left ventricular diastolic parameters were  normal.   2. Right ventricular systolic function is normal. The right ventricular  size is normal.   3. The mitral valve is grossly normal. No evidence of mitral valve  regurgitation.   4. The aortic valve is grossly normal. Aortic valve regurgitation is not   visualized. No aortic stenosis is present.   Coronary CT with FFR analysis 01/05/19: 1. Left Main:  No significant stenosis.  FFRct 0.97   2. LAD: No significant stenosis. FFRct 0.93 proximal, 0.93 mid, 0.91 distal 3. LCX: No significant stenosis.  FFRct 0.97 proximal, 0.92 distal 4. RCA: No significant stenosis.  FFRct 0.99 proximal, 0.94 distal   IMPRESSION: 1.  CT FFR analysis didn't show any significant stenosis.   2. Recommend aggressive risk factor modification including high potency statin.   Nuclear stress 08/19/2017: Nuclear stress EF: 60%. Mild inferoseptal wall hypokinesis There was no ST segment deviation noted during stress. This is a low risk study. No ischemia identified. The study is normal.   Wynonia Musty [facility] Short Stay Center/Anesthesiology Phone [phone] 02/11/2022 12:21 PM

## 2022-02-11 NOTE — Anesthesia Preprocedure Evaluation (Signed)
Anesthesia Evaluation  Patient identified by MRN, date of birth, ID band Patient awake    Reviewed: Allergy & Precautions, H&P , NPO status , Patient's Chart, lab work & pertinent test results  Airway Mallampati: III  TM Distance: >3 FB Neck ROM: Full    Dental no notable dental hx. (+) Poor Dentition, Loose, Dental Advisory Given   Pulmonary asthma , sleep apnea , COPD,  COPD inhaler, Current Smoker and Patient abstained from smoking.   Pulmonary exam normal  + wheezing      Cardiovascular Exercise Tolerance: Good hypertension, Pt. on medications and Pt. on home beta blockers  Rhythm:Regular Rate:Normal     Neuro/Psych  Headaches  Anxiety Depression Bipolar Disorder      GI/Hepatic Neg liver ROS,GERD  Medicated,,  Endo/Other  diabetesHypothyroidism    Renal/GU negative Renal ROS  negative genitourinary   Musculoskeletal  (+) Arthritis , Osteoarthritis,    Abdominal   Peds  Hematology  (+) Blood dyscrasia, anemia   Anesthesia Other Findings   Reproductive/Obstetrics negative OB ROS                             Anesthesia Physical Anesthesia Plan  ASA: 3  Anesthesia Plan: General   Post-op Pain Management: Tylenol PO (pre-op)*   Induction: Intravenous  PONV Risk Score and Plan: 3 and Ondansetron, Dexamethasone and Midazolam  Airway Management Planned: Oral ETT  Additional Equipment:   Intra-op Plan:   Post-operative Plan: Possible Post-op intubation/ventilation  Informed Consent: I have reviewed the patients History and Physical, chart, labs and discussed the procedure including the risks, benefits and alternatives for the proposed anesthesia with the patient or authorized representative who has indicated his/her understanding and acceptance.     Dental advisory given  Plan Discussed with: CRNA  Anesthesia Plan Comments: (PAT note by Karoline Caldwell, PA-C: Follows with  cardiology for hx of HTN, atypical chest pain, HLD. Nonischemic nuclear stress 2019. Coronary CT without significant obstruction. Last seen by Coletta Memos, NP 01/15/22 for preop eval. Per note, "Chart reviewed as part of pre-operative protocol coverage. Given past medical history and time since last visit, based on ACC/AHA guidelines,Dawne Collene Martinwould be at acceptable risk for the planned procedure without further cardiovascular testing. Her RCRI is a class I risk, 0.4% risk of major cardiac event. She is able to complete greater than 4 METS of physical activity."  Follows with pulmonology for hx of severe COPD, chronic hypoxic respiratory failure, former smoker, OSA on CPAP. She is maintained on Breztri, Daliresp, Combivent PRN, Continuous O2 3L, Seen by Eric Form 01/29/22, stable at that time, no changes, upcoming c-spine surgery discussed.   NIDDM2, A1c 7.0 on preop labs.  Proep labs reviewed, potassium mildly low at 3.4, WBC elevated at 20.4 - this result was communicated to surgeon by Alba Cory, RN.  EKG 04/17/2021: NSR. Rate 69. Nonspecific T wave abnormality.  CT Lung cancer screening 05/01/2021: IMPRESSION: 1. Lung-RADS 2, benign appearance or behavior. Continue annual screening with low-dose chest CT without contrast in 12 months. 2. Two-vessel coronary atherosclerosis. 3. Aortic Atherosclerosis (ICD10-I70.0) and Emphysema (ICD10-J43.9).  TTE 08/11/20: 1. Left ventricular ejection fraction, by estimation, is 60 to 65%. The  left ventricle has normal function. The left ventricle has no regional  wall motion abnormalities. Left ventricular diastolic parameters were  normal.  2. Right ventricular systolic function is normal. The right ventricular  size is normal.  3. The mitral valve is  grossly normal. No evidence of mitral valve  regurgitation.  4. The aortic valve is grossly normal. Aortic valve regurgitation is not  visualized. No aortic stenosis is present.    Coronary CT with FFR analysis 01/05/19: 1. Left Main: No significant stenosis. FFRct 0.97  2. LAD: No significant stenosis. FFRct 0.93 proximal, 0.93 mid, 0.91 distal 3. LCX: No significant stenosis. FFRct 0.97 proximal, 0.92 distal 4. RCA: No significant stenosis. FFRct 0.99 proximal, 0.94 distal  IMPRESSION: 1. CT FFR analysis didn't show any significant stenosis.  2. Recommend aggressive risk factor modification including high potency statin.  Nuclear stress 08/19/2017: ? Nuclear stress EF: 60%. Mild inferoseptal wall hypokinesis ? There was no ST segment deviation noted during stress. ? This is a low risk study. No ischemia identified. ? The study is normal.   )        Anesthesia Quick Evaluation

## 2022-02-12 ENCOUNTER — Other Ambulatory Visit: Payer: Self-pay | Admitting: Neurosurgery

## 2022-02-12 ENCOUNTER — Telehealth: Payer: Self-pay | Admitting: Internal Medicine

## 2022-02-12 NOTE — Telephone Encounter (Signed)
Patient wants Dr. Sharlet Salina to know that her white blood cell count is 20.4 - her Neurosurgeon wanted you to know.

## 2022-02-12 NOTE — Telephone Encounter (Signed)
If patient feeling well ok to proceed with surgery. If feeling unwell needs visit to assess with any provider

## 2022-02-14 ENCOUNTER — Other Ambulatory Visit: Payer: Self-pay

## 2022-02-14 ENCOUNTER — Ambulatory Visit (HOSPITAL_COMMUNITY): Payer: Medicare Other | Admitting: Physician Assistant

## 2022-02-14 ENCOUNTER — Encounter (HOSPITAL_COMMUNITY): Payer: Self-pay

## 2022-02-14 ENCOUNTER — Ambulatory Visit (HOSPITAL_COMMUNITY): Payer: Medicare Other

## 2022-02-14 ENCOUNTER — Ambulatory Visit (HOSPITAL_COMMUNITY)
Admission: RE | Admit: 2022-02-14 | Discharge: 2022-02-15 | Disposition: A | Discharge status: Home / Self Care | Payer: Medicare Other | Attending: Neurosurgery | Admitting: Neurosurgery

## 2022-02-14 ENCOUNTER — Ambulatory Visit (HOSPITAL_BASED_OUTPATIENT_CLINIC_OR_DEPARTMENT_OTHER): Payer: Medicare Other | Admitting: Physician Assistant

## 2022-02-14 ENCOUNTER — Encounter (HOSPITAL_COMMUNITY)
Admission: RE | Disposition: A | Discharge status: Home / Self Care | Payer: Self-pay | Source: Home / Self Care | Attending: Neurosurgery

## 2022-02-14 DIAGNOSIS — M4722 Other spondylosis with radiculopathy, cervical region: Secondary | ICD-10-CM | POA: Insufficient documentation

## 2022-02-14 DIAGNOSIS — E1140 Type 2 diabetes mellitus with diabetic neuropathy, unspecified: Secondary | ICD-10-CM | POA: Insufficient documentation

## 2022-02-14 DIAGNOSIS — J449 Chronic obstructive pulmonary disease, unspecified: Secondary | ICD-10-CM | POA: Insufficient documentation

## 2022-02-14 DIAGNOSIS — I10 Essential (primary) hypertension: Secondary | ICD-10-CM | POA: Diagnosis not present

## 2022-02-14 DIAGNOSIS — F319 Bipolar disorder, unspecified: Secondary | ICD-10-CM | POA: Insufficient documentation

## 2022-02-14 DIAGNOSIS — Z79899 Other long term (current) drug therapy: Secondary | ICD-10-CM | POA: Insufficient documentation

## 2022-02-14 DIAGNOSIS — J439 Emphysema, unspecified: Secondary | ICD-10-CM | POA: Insufficient documentation

## 2022-02-14 DIAGNOSIS — F411 Generalized anxiety disorder: Secondary | ICD-10-CM | POA: Insufficient documentation

## 2022-02-14 DIAGNOSIS — M4802 Spinal stenosis, cervical region: Secondary | ICD-10-CM | POA: Diagnosis not present

## 2022-02-14 DIAGNOSIS — K219 Gastro-esophageal reflux disease without esophagitis: Secondary | ICD-10-CM | POA: Insufficient documentation

## 2022-02-14 DIAGNOSIS — M5412 Radiculopathy, cervical region: Secondary | ICD-10-CM | POA: Diagnosis not present

## 2022-02-14 DIAGNOSIS — F17210 Nicotine dependence, cigarettes, uncomplicated: Secondary | ICD-10-CM | POA: Insufficient documentation

## 2022-02-14 DIAGNOSIS — E034 Atrophy of thyroid (acquired): Secondary | ICD-10-CM | POA: Insufficient documentation

## 2022-02-14 DIAGNOSIS — R1310 Dysphagia, unspecified: Secondary | ICD-10-CM | POA: Diagnosis not present

## 2022-02-14 DIAGNOSIS — G4733 Obstructive sleep apnea (adult) (pediatric): Secondary | ICD-10-CM | POA: Diagnosis not present

## 2022-02-14 DIAGNOSIS — E876 Hypokalemia: Secondary | ICD-10-CM

## 2022-02-14 DIAGNOSIS — Z01818 Encounter for other preprocedural examination: Secondary | ICD-10-CM

## 2022-02-14 HISTORY — PX: ANTERIOR CERVICAL DECOMP/DISCECTOMY FUSION: SHX1161

## 2022-02-14 LAB — GLUCOSE, CAPILLARY
Glucose-Capillary: 195 mg/dL — ABNORMAL HIGH (ref 70–99)
Glucose-Capillary: 185 mg/dL — ABNORMAL HIGH (ref 70–99)
Glucose-Capillary: 227 mg/dL — ABNORMAL HIGH (ref 70–99)
Glucose-Capillary: 205 mg/dL — ABNORMAL HIGH (ref 70–99)

## 2022-02-14 SURGERY — ANTERIOR CERVICAL DECOMPRESSION/DISCECTOMY FUSION 1 LEVEL
Anesthesia: General

## 2022-02-14 MED ORDER — MIDAZOLAM HCL 2 MG/2ML IJ SOLN
INTRAMUSCULAR | Status: AC
  Filled 2022-02-14: qty 2

## 2022-02-14 MED ORDER — SODIUM CHLORIDE 0.9 % IV SOLN
250.0000 mL | INTRAVENOUS | Status: DC
  Administered 2022-02-14: 250 mL via INTRAVENOUS

## 2022-02-14 MED ORDER — DIPHENHYDRAMINE HCL 25 MG PO CAPS
25.0000 mg | ORAL_CAPSULE | Freq: Two times a day (BID) | ORAL | Status: DC
  Administered 2022-02-14 – 2022-02-15 (×2): 25 mg via ORAL
  Filled 2022-02-14 (×2): qty 1

## 2022-02-14 MED ORDER — MOMETASONE FURO-FORMOTEROL FUM 200-5 MCG/ACT IN AERO
2.0000 | INHALATION_SPRAY | Freq: Two times a day (BID) | RESPIRATORY_TRACT | Status: DC
  Administered 2022-02-14 – 2022-02-15 (×2): 2 via RESPIRATORY_TRACT
  Filled 2022-02-14: qty 8.8

## 2022-02-14 MED ORDER — FLEET ENEMA 7-19 GM/118ML RE ENEM: 1.0000 | ENEMA | Freq: Once | RECTAL | Status: DC | PRN

## 2022-02-14 MED ORDER — INSULIN ASPART 100 UNIT/ML IJ SOLN
INTRAMUSCULAR | Status: AC
  Administered 2022-02-14: 4 [IU] via SUBCUTANEOUS
  Filled 2022-02-14: qty 1

## 2022-02-14 MED ORDER — ALBUTEROL SULFATE HFA 108 (90 BASE) MCG/ACT IN AERS
INHALATION_SPRAY | RESPIRATORY_TRACT | Status: DC | PRN
  Administered 2022-02-14 (×2): 2 via RESPIRATORY_TRACT

## 2022-02-14 MED ORDER — MENTHOL 3 MG MT LOZG: 1.0000 | LOZENGE | OROMUCOSAL | Status: DC | PRN

## 2022-02-14 MED ORDER — LEVOTHYROXINE SODIUM 25 MCG PO TABS
25.0000 ug | ORAL_TABLET | Freq: Every day | ORAL | Status: DC
  Administered 2022-02-15: 25 ug via ORAL
  Filled 2022-02-14: qty 1

## 2022-02-14 MED ORDER — SODIUM CHLORIDE 0.9% FLUSH: 3.0000 mL | INTRAVENOUS | Status: DC | PRN

## 2022-02-14 MED ORDER — ONDANSETRON HCL 4 MG/2ML IJ SOLN: 4.0000 mg | Freq: Four times a day (QID) | INTRAMUSCULAR | Status: DC | PRN

## 2022-02-14 MED ORDER — LORATADINE 10 MG PO TABS
10.0000 mg | ORAL_TABLET | Freq: Every day | ORAL | Status: DC
  Filled 2022-02-14: qty 1

## 2022-02-14 MED ORDER — ACETAMINOPHEN 650 MG RE SUPP: 650.0000 mg | RECTAL | Status: DC | PRN

## 2022-02-14 MED ORDER — LIDOCAINE 2% (20 MG/ML) 5 ML SYRINGE
INTRAMUSCULAR | Status: AC
  Filled 2022-02-14: qty 5

## 2022-02-14 MED ORDER — PREGABALIN 100 MG PO CAPS
300.0000 mg | ORAL_CAPSULE | Freq: Two times a day (BID) | ORAL | Status: DC
  Administered 2022-02-14 – 2022-02-15 (×3): 300 mg via ORAL
  Filled 2022-02-14 (×3): qty 3

## 2022-02-14 MED ORDER — CHLORHEXIDINE GLUCONATE CLOTH 2 % EX PADS: 6.0000 | MEDICATED_PAD | Freq: Once | CUTANEOUS | Status: DC

## 2022-02-14 MED ORDER — UMECLIDINIUM BROMIDE 62.5 MCG/ACT IN AEPB
1.0000 | INHALATION_SPRAY | Freq: Every day | RESPIRATORY_TRACT | Status: DC
  Administered 2022-02-15: 1 via RESPIRATORY_TRACT
  Filled 2022-02-14: qty 7

## 2022-02-14 MED ORDER — PHENOL 1.4 % MT LIQD: 1.0000 | OROMUCOSAL | Status: DC | PRN

## 2022-02-14 MED ORDER — SUFENTANIL CITRATE 50 MCG/ML IV SOLN
INTRAVENOUS | Status: AC
  Filled 2022-02-14: qty 1

## 2022-02-14 MED ORDER — THROMBIN 5000 UNITS EX SOLR
CUTANEOUS | Status: AC
  Filled 2022-02-14: qty 5000

## 2022-02-14 MED ORDER — SUFENTANIL CITRATE 50 MCG/ML IV SOLN
INTRAVENOUS | Status: DC | PRN
  Administered 2022-02-14 (×2): 10 ug via INTRAVENOUS

## 2022-02-14 MED ORDER — VANCOMYCIN HCL IN DEXTROSE 1-5 GM/200ML-% IV SOLN
1000.0000 mg | INTRAVENOUS | Status: AC
  Administered 2022-02-14: 1000 mg via INTRAVENOUS
  Filled 2022-02-14: qty 200

## 2022-02-14 MED ORDER — VARENICLINE TARTRATE 0.5 MG PO TABS
0.5000 mg | ORAL_TABLET | Freq: Every day | ORAL | Status: DC
  Filled 2022-02-14: qty 1

## 2022-02-14 MED ORDER — ACYCLOVIR 400 MG PO TABS
400.0000 mg | ORAL_TABLET | Freq: Every morning | ORAL | Status: DC
  Administered 2022-02-15: 400 mg via ORAL
  Filled 2022-02-14: qty 1

## 2022-02-14 MED ORDER — ALBUTEROL SULFATE HFA 108 (90 BASE) MCG/ACT IN AERS
INHALATION_SPRAY | RESPIRATORY_TRACT | Status: AC
  Filled 2022-02-14: qty 6.7

## 2022-02-14 MED ORDER — ORAL CARE MOUTH RINSE: 15.0000 mL | Freq: Once | OROMUCOSAL | Status: AC

## 2022-02-14 MED ORDER — HYDROCHLOROTHIAZIDE 25 MG PO TABS
25.0000 mg | ORAL_TABLET | Freq: Every day | ORAL | Status: DC
  Administered 2022-02-15: 25 mg via ORAL
  Filled 2022-02-14: qty 1

## 2022-02-14 MED ORDER — FLUTICASONE PROPIONATE 50 MCG/ACT NA SUSP: 2.0000 | Freq: Every day | NASAL | Status: DC | PRN

## 2022-02-14 MED ORDER — ONDANSETRON HCL 4 MG/2ML IJ SOLN
INTRAMUSCULAR | Status: AC
  Filled 2022-02-14: qty 2

## 2022-02-14 MED ORDER — LACTATED RINGERS IV SOLN: INTRAVENOUS | Status: DC

## 2022-02-14 MED ORDER — THROMBIN 5000 UNITS EX SOLR
OROMUCOSAL | Status: DC | PRN
  Administered 2022-02-14: 5 mL via TOPICAL

## 2022-02-14 MED ORDER — OXYCODONE HCL 5 MG PO TABS
10.0000 mg | ORAL_TABLET | ORAL | Status: DC | PRN
  Administered 2022-02-14: 10 mg via ORAL
  Filled 2022-02-14: qty 2

## 2022-02-14 MED ORDER — LIDOCAINE 2% (20 MG/ML) 5 ML SYRINGE
INTRAMUSCULAR | Status: DC | PRN
  Administered 2022-02-14: 80 mg via INTRAVENOUS

## 2022-02-14 MED ORDER — SODIUM CHLORIDE 0.9% FLUSH
3.0000 mL | Freq: Two times a day (BID) | INTRAVENOUS | Status: DC
  Administered 2022-02-14: 3 mL via INTRAVENOUS

## 2022-02-14 MED ORDER — PHENYLEPHRINE HCL-NACL 20-0.9 MG/250ML-% IV SOLN
INTRAVENOUS | Status: DC | PRN
  Administered 2022-02-14: 20 ug/min via INTRAVENOUS

## 2022-02-14 MED ORDER — SUGAMMADEX SODIUM 200 MG/2ML IV SOLN
INTRAVENOUS | Status: DC | PRN
  Administered 2022-02-14: 200 mg via INTRAVENOUS

## 2022-02-14 MED ORDER — ROCURONIUM BROMIDE 10 MG/ML (PF) SYRINGE
PREFILLED_SYRINGE | INTRAVENOUS | Status: AC
  Filled 2022-02-14: qty 10

## 2022-02-14 MED ORDER — PANTOPRAZOLE SODIUM 40 MG PO TBEC
40.0000 mg | DELAYED_RELEASE_TABLET | Freq: Every day | ORAL | Status: DC
  Administered 2022-02-15: 40 mg via ORAL
  Filled 2022-02-14: qty 1

## 2022-02-14 MED ORDER — PROPOFOL 10 MG/ML IV BOLUS
INTRAVENOUS | Status: DC | PRN
  Administered 2022-02-14: 100 mg via INTRAVENOUS

## 2022-02-14 MED ORDER — PROPOFOL 10 MG/ML IV BOLUS
INTRAVENOUS | Status: AC
  Filled 2022-02-14: qty 20

## 2022-02-14 MED ORDER — 0.9 % SODIUM CHLORIDE (POUR BTL) OPTIME
TOPICAL | Status: DC | PRN
  Administered 2022-02-14: 1000 mL

## 2022-02-14 MED ORDER — CHLORHEXIDINE GLUCONATE 0.12 % MT SOLN
15.0000 mL | Freq: Once | OROMUCOSAL | Status: AC
  Administered 2022-02-14: 15 mL via OROMUCOSAL
  Filled 2022-02-14: qty 15

## 2022-02-14 MED ORDER — ATENOLOL 25 MG PO TABS
25.0000 mg | ORAL_TABLET | Freq: Every day | ORAL | Status: DC
  Administered 2022-02-15: 25 mg via ORAL
  Filled 2022-02-14: qty 1

## 2022-02-14 MED ORDER — DEXAMETHASONE SODIUM PHOSPHATE 10 MG/ML IJ SOLN
INTRAMUSCULAR | Status: AC
  Filled 2022-02-14: qty 1

## 2022-02-14 MED ORDER — BUSPIRONE HCL 10 MG PO TABS
5.0000 mg | ORAL_TABLET | Freq: Every day | ORAL | Status: DC
  Administered 2022-02-14: 5 mg via ORAL
  Filled 2022-02-14: qty 1

## 2022-02-14 MED ORDER — FENTANYL CITRATE (PF) 100 MCG/2ML IJ SOLN
INTRAMUSCULAR | Status: AC
  Filled 2022-02-14: qty 2

## 2022-02-14 MED ORDER — OXYCODONE HCL 5 MG PO TABS
5.0000 mg | ORAL_TABLET | ORAL | Status: DC | PRN
  Administered 2022-02-14: 5 mg via ORAL
  Filled 2022-02-14: qty 1

## 2022-02-14 MED ORDER — QUETIAPINE FUMARATE 50 MG PO TABS
50.0000 mg | ORAL_TABLET | Freq: Every day | ORAL | Status: DC
  Administered 2022-02-14: 50 mg via ORAL
  Filled 2022-02-14: qty 1

## 2022-02-14 MED ORDER — ONDANSETRON HCL 4 MG PO TABS: 4.0000 mg | ORAL_TABLET | Freq: Four times a day (QID) | ORAL | Status: DC | PRN

## 2022-02-14 MED ORDER — POTASSIUM CHLORIDE IN NACL 20-0.9 MEQ/L-% IV SOLN: INTRAVENOUS | Status: DC

## 2022-02-14 MED ORDER — CYCLOBENZAPRINE HCL 10 MG PO TABS
10.0000 mg | ORAL_TABLET | Freq: Every day | ORAL | Status: DC
  Administered 2022-02-14: 10 mg via ORAL
  Filled 2022-02-14: qty 1

## 2022-02-14 MED ORDER — LIDOCAINE-EPINEPHRINE 1 %-1:100000 IJ SOLN
INTRAMUSCULAR | Status: DC | PRN
  Administered 2022-02-14: 5 mL

## 2022-02-14 MED ORDER — ATORVASTATIN CALCIUM 10 MG PO TABS
20.0000 mg | ORAL_TABLET | Freq: Every day | ORAL | Status: DC
  Administered 2022-02-15: 20 mg via ORAL
  Filled 2022-02-14: qty 2

## 2022-02-14 MED ORDER — DEXAMETHASONE SODIUM PHOSPHATE 10 MG/ML IJ SOLN
INTRAMUSCULAR | Status: DC | PRN
  Administered 2022-02-14: 10 mg via INTRAVENOUS

## 2022-02-14 MED ORDER — PHENYLEPHRINE 80 MCG/ML (10ML) SYRINGE FOR IV PUSH (FOR BLOOD PRESSURE SUPPORT)
PREFILLED_SYRINGE | INTRAVENOUS | Status: AC
  Filled 2022-02-14: qty 10

## 2022-02-14 MED ORDER — ACETAMINOPHEN 500 MG PO TABS
1000.0000 mg | ORAL_TABLET | Freq: Once | ORAL | Status: AC
  Administered 2022-02-14: 1000 mg via ORAL
  Filled 2022-02-14: qty 2

## 2022-02-14 MED ORDER — BUPROPION HCL ER (XL) 300 MG PO TB24
300.0000 mg | ORAL_TABLET | Freq: Every day | ORAL | Status: DC
  Administered 2022-02-15: 300 mg via ORAL
  Filled 2022-02-14: qty 1

## 2022-02-14 MED ORDER — VANCOMYCIN HCL 750 MG/150ML IV SOLN
750.0000 mg | Freq: Two times a day (BID) | INTRAVENOUS | Status: DC
  Administered 2022-02-14: 750 mg via INTRAVENOUS
  Filled 2022-02-14: qty 150

## 2022-02-14 MED ORDER — ROCURONIUM BROMIDE 10 MG/ML (PF) SYRINGE
PREFILLED_SYRINGE | INTRAVENOUS | Status: DC | PRN
  Administered 2022-02-14: 50 mg via INTRAVENOUS

## 2022-02-14 MED ORDER — ROFLUMILAST 500 MCG PO TABS
500.0000 ug | ORAL_TABLET | Freq: Every day | ORAL | Status: DC
  Administered 2022-02-15: 500 ug via ORAL
  Filled 2022-02-14: qty 1

## 2022-02-14 MED ORDER — TRAZODONE HCL 50 MG PO TABS
100.0000 mg | ORAL_TABLET | Freq: Every day | ORAL | Status: DC
  Administered 2022-02-14: 100 mg via ORAL
  Filled 2022-02-14: qty 2

## 2022-02-14 MED ORDER — ACETAMINOPHEN 325 MG PO TABS
650.0000 mg | ORAL_TABLET | ORAL | Status: DC | PRN
  Administered 2022-02-14 (×2): 650 mg via ORAL
  Filled 2022-02-14 (×2): qty 2

## 2022-02-14 MED ORDER — ALBUTEROL SULFATE (2.5 MG/3ML) 0.083% IN NEBU
2.5000 mg | INHALATION_SOLUTION | Freq: Two times a day (BID) | RESPIRATORY_TRACT | Status: DC
  Administered 2022-02-14: 2.5 mg via RESPIRATORY_TRACT
  Filled 2022-02-14: qty 3

## 2022-02-14 MED ORDER — ONDANSETRON HCL 4 MG/2ML IJ SOLN
INTRAMUSCULAR | Status: DC | PRN
  Administered 2022-02-14: 4 mg via INTRAVENOUS

## 2022-02-14 MED ORDER — INSULIN ASPART 100 UNIT/ML IJ SOLN: 0.0000 [IU] | INTRAMUSCULAR | Status: DC | PRN

## 2022-02-14 MED ORDER — CAMPHOR-MENTHOL-METHYL SAL 3.1-6-10 % EX PTCH: 1.0000 | MEDICATED_PATCH | Freq: Every day | CUTANEOUS | Status: DC | PRN

## 2022-02-14 MED ORDER — PHENYLEPHRINE 80 MCG/ML (10ML) SYRINGE FOR IV PUSH (FOR BLOOD PRESSURE SUPPORT)
PREFILLED_SYRINGE | INTRAVENOUS | Status: DC | PRN
  Administered 2022-02-14 (×2): 80 ug via INTRAVENOUS

## 2022-02-14 MED ORDER — IPRATROPIUM-ALBUTEROL 0.5-2.5 (3) MG/3ML IN SOLN: 3.0000 mL | Freq: Four times a day (QID) | RESPIRATORY_TRACT | Status: DC | PRN

## 2022-02-14 MED ORDER — MORPHINE SULFATE (PF) 2 MG/ML IV SOLN: 2.0000 mg | INTRAVENOUS | Status: DC | PRN

## 2022-02-14 MED ORDER — MONTELUKAST SODIUM 10 MG PO TABS
10.0000 mg | ORAL_TABLET | Freq: Every day | ORAL | Status: DC
  Administered 2022-02-14: 10 mg via ORAL
  Filled 2022-02-14: qty 1

## 2022-02-14 MED ORDER — VENLAFAXINE HCL ER 75 MG PO CP24
150.0000 mg | ORAL_CAPSULE | Freq: Every day | ORAL | Status: DC
  Administered 2022-02-15: 150 mg via ORAL
  Filled 2022-02-14: qty 2

## 2022-02-14 MED ORDER — FENTANYL CITRATE (PF) 100 MCG/2ML IJ SOLN
25.0000 ug | INTRAMUSCULAR | Status: DC | PRN
  Administered 2022-02-14 (×2): 25 ug via INTRAVENOUS

## 2022-02-14 MED ORDER — BUDESON-GLYCOPYRROL-FORMOTEROL 160-9-4.8 MCG/ACT IN AERO: 2.0000 | INHALATION_SPRAY | Freq: Two times a day (BID) | RESPIRATORY_TRACT | Status: DC

## 2022-02-14 MED ORDER — POLYETHYLENE GLYCOL 3350 17 G PO PACK: 17.0000 g | PACK | Freq: Every day | ORAL | Status: DC | PRN

## 2022-02-14 MED ORDER — LIDOCAINE-EPINEPHRINE 1 %-1:100000 IJ SOLN
INTRAMUSCULAR | Status: AC
  Filled 2022-02-14: qty 1

## 2022-02-14 SURGICAL SUPPLY — 67 items
BAG COUNTER SPONGE SURGICOUNT (BAG) ×1 IMPLANT
BAND RUBBER #18 3X1/16 STRL (MISCELLANEOUS) ×2 IMPLANT
BASKET BONE COLLECTION (BASKET) IMPLANT
BENZOIN TINCTURE PRP APPL 2/3 (GAUZE/BANDAGES/DRESSINGS) ×1 IMPLANT
BIT DRILL 13 (BIT) IMPLANT
BIT DRILL NEURO 2X3.1 SFT TUCH (MISCELLANEOUS) ×1 IMPLANT
BLADE CLIPPER SURG (BLADE) IMPLANT
BLADE SURG 15 STRL LF DISP TIS (BLADE) IMPLANT
BLADE SURG 15 STRL SS (BLADE)
BLADE ULTRA TIP 2M (BLADE) IMPLANT
BUR MATCHSTICK NEURO 3.0 LAGG (BURR) ×1 IMPLANT
CANISTER SUCT 3000ML PPV (MISCELLANEOUS) ×1 IMPLANT
COLLAR CERV LO CONTOUR FIRM DE (SOFTGOODS) ×1 IMPLANT
DEVICE EDNSKLTN TC NNLCK MED 8 (Cage) IMPLANT
DRAPE C-ARM 42X72 X-RAY (DRAPES) ×2 IMPLANT
DRAPE LAPAROTOMY 100X72 PEDS (DRAPES) ×1 IMPLANT
DRAPE MICROSCOPE SLANT 54X150 (MISCELLANEOUS) ×1 IMPLANT
DRAPE SHEET LG 3/4 BI-LAMINATE (DRAPES) ×1 IMPLANT
DRESSING MEPILEX FLEX 4X4 (GAUZE/BANDAGES/DRESSINGS) IMPLANT
DRILL NEURO 2X3.1 SOFT TOUCH (MISCELLANEOUS) ×1
DRSG MEPILEX FLEX 4X4 (GAUZE/BANDAGES/DRESSINGS)
DRSG OPSITE 4X5.5 SM (GAUZE/BANDAGES/DRESSINGS) ×2 IMPLANT
DRSG OPSITE POSTOP 3X4 (GAUZE/BANDAGES/DRESSINGS) ×1 IMPLANT
DRSG OPSITE POSTOP 4X6 (GAUZE/BANDAGES/DRESSINGS) IMPLANT
DURAPREP 26ML APPLICATOR (WOUND CARE) ×1 IMPLANT
ELECT COATED BLADE 2.86 ST (ELECTRODE) ×1 IMPLANT
ELECT REM PT RETURN 9FT ADLT (ELECTROSURGICAL) ×1
ELECTRODE REM PT RTRN 9FT ADLT (ELECTROSURGICAL) ×1 IMPLANT
ENDOSKELTON IMPLANT TC MED 8MM (Cage) ×1 IMPLANT
GAUZE 4X4 16PLY ~~LOC~~+RFID DBL (SPONGE) IMPLANT
GLOVE BIOGEL PI IND STRL 7.5 (GLOVE) ×1 IMPLANT
GLOVE ECLIPSE 7.5 STRL STRAW (GLOVE) ×1 IMPLANT
GLOVE SURG ENC MOIS LTX SZ8 (GLOVE) ×1 IMPLANT
GLOVE SURG UNDER POLY LF SZ8.5 (GLOVE) ×1 IMPLANT
GOWN STRL REUS W/ TWL LRG LVL3 (GOWN DISPOSABLE) ×2 IMPLANT
GOWN STRL REUS W/ TWL XL LVL3 (GOWN DISPOSABLE) ×2 IMPLANT
GOWN STRL REUS W/TWL 2XL LVL3 (GOWN DISPOSABLE) IMPLANT
GOWN STRL REUS W/TWL LRG LVL3 (GOWN DISPOSABLE) ×2
GOWN STRL REUS W/TWL XL LVL3 (GOWN DISPOSABLE) ×2
HEMOSTAT POWDER KIT SURGIFOAM (HEMOSTASIS) ×1 IMPLANT
KIT BASIN OR (CUSTOM PROCEDURE TRAY) ×1 IMPLANT
KIT TURNOVER KIT B (KITS) ×1 IMPLANT
NDL SPNL 22GX3.5 QUINCKE BK (NEEDLE) ×1 IMPLANT
NEEDLE HYPO 22GX1.5 SAFETY (NEEDLE) ×1 IMPLANT
NEEDLE SPNL 22GX3.5 QUINCKE BK (NEEDLE) ×1 IMPLANT
NS IRRIG 1000ML POUR BTL (IV SOLUTION) ×1 IMPLANT
PACK LAMINECTOMY NEURO (CUSTOM PROCEDURE TRAY) ×1 IMPLANT
PAD ARMBOARD 7.5X6 YLW CONV (MISCELLANEOUS) ×3 IMPLANT
PATTIES SURGICAL .5 X3 (DISPOSABLE) ×1 IMPLANT
PIN DISTRACTION 14MM (PIN) ×2 IMPLANT
PLATE ZEVO 1LVL 19MM (Plate) IMPLANT
PUTTY DBF 1CC CORTICAL FIBERS (Putty) IMPLANT
SCREW 3.5 SELFDRILL 15MM VARI (Screw) IMPLANT
SPIKE FLUID TRANSFER (MISCELLANEOUS) ×1 IMPLANT
SPONGE INTESTINAL PEANUT (DISPOSABLE) ×1 IMPLANT
SPONGE SURGIFOAM ABS GEL SZ50 (HEMOSTASIS) IMPLANT
STAPLER VISISTAT 35W (STAPLE) IMPLANT
STRIP CLOSURE SKIN 1/2X4 (GAUZE/BANDAGES/DRESSINGS) ×1 IMPLANT
SUT MNCRL AB 4-0 PS2 18 (SUTURE) ×1 IMPLANT
SUT SILK 2 0 TIES 10X30 (SUTURE) IMPLANT
SUT VIC AB 0 CT1 27 (SUTURE)
SUT VIC AB 0 CT1 27XBRD ANTBC (SUTURE) IMPLANT
SUT VIC AB 3-0 SH 8-18 (SUTURE) ×1 IMPLANT
TAPE CLOTH 3X10 TAN LF (GAUZE/BANDAGES/DRESSINGS) ×1 IMPLANT
TOWEL GREEN STERILE (TOWEL DISPOSABLE) ×1 IMPLANT
TOWEL GREEN STERILE FF (TOWEL DISPOSABLE) ×1 IMPLANT
WATER STERILE IRR 1000ML POUR (IV SOLUTION) ×1 IMPLANT

## 2022-02-14 NOTE — Transfer of Care (Signed)
Immediate Anesthesia Transfer of Care Note  Patient: Joan [name]  Procedure(s) Performed: Anterior Cervical Decompression/Discectomy Fusion Cervical Three-Cervical Four  Patient Location: PACU  Anesthesia Type:General  Level of Consciousness: awake and confused  Airway & Oxygen Therapy: Patient Spontanous Breathing and Patient connected to nasal cannula oxygen  Post-op Assessment: Report given to RN and Post -op Vital signs reviewed and stable  Post vital signs: Reviewed and stable  Last Vitals:  Vitals Value Taken Time  BP 127/69   Temp    Pulse 76 02/14/22 0957  Resp 20 02/14/22 0957  SpO2 93 % 02/14/22 0957  Vitals shown include unvalidated device data.  Last Pain:  Vitals:   02/14/22 0616  TempSrc:   PainSc: 6       Patients Stated Pain Goal: 2 (25/48/62 8241)  Complications: No notable events documented.

## 2022-02-14 NOTE — Progress Notes (Signed)
Pharmacy Antibiotic Note  Joan [name] is a 61 y.o. female admitted on 02/14/2022 with surgical prophylaxis. Pharmacy has been consulted for vancomycin dosing.  Plan: Vancomycin 750 mg IV Q12h x2 doses (eAUC 472, goal AUC 400-550, Scr 1.04, Vd 0.72)  Height: '5\' 8"'$  (172.7 cm) Weight: 83.9 kg (185 lb) IBW/kg (Calculated) : 63.9  Temp (24hrs), Avg:98.2 F (36.8 C), Min:97.6 F (36.4 C), Max:98.5 F (36.9 C)  Recent Labs  Lab 02/08/22 1430  WBC 20.4*    Estimated Creatinine Clearance: 65.3 mL/min (by C-G formula based on SCr of 1.04 mg/dL).    Allergies  Allergen Reactions   Keflex [Cephalexin] Rash and Other (See Comments)    Has tolerated amoxicillin since had keflex   Shellfish Allergy Shortness Of Breath, Nausea And Vomiting and Other (See Comments)    Stomach cramps   Tetracyclines & Related Anaphylaxis, Swelling and Other (See Comments)    Throat swelling requiring hospitalization   Dilaudid [Hydromorphone Hcl] Other (See Comments)    "Lethargy"   Prednisone Other (See Comments)    "counter reacts", has tolerated IV medrol   Clindamycin/Lincomycin Other (See Comments)    Burning sensation in her mouth down her throat    Incruse Ellipta [Umeclidinium Bromide] Nausea Only   Molds & Smuts Itching   Tuberculin Tests Other (See Comments)    False postive    Thank you for allowing pharmacy to be a part of this patient's care.  Ardyth Harps, PharmD Clinical Pharmacist

## 2022-02-14 NOTE — Progress Notes (Signed)
Orthopedic Tech Progress Note Patient Details:  Joan [name] 05-04-1960 [id number]  Ortho Devices Type of Ortho Device: Soft collar Ortho Device/Splint Interventions: Ordered, Application, Adjustment   Post Interventions Patient Tolerated: Well  Chip Boer 02/14/2022, 12:33 PM

## 2022-02-14 NOTE — H&P (Signed)
CC: neck pain  HPI:     Patient is a 61 y.o. female presents with cervical radiculopathy refractory to numerous medical and nonsurgical methods.  She is here for elective ACDF.    Patient Active Problem List   Diagnosis Date Noted   Chronic left shoulder pain 11/26/2021   Chronic pain of left thumb 11/26/2021   Chronic obstructive pulmonary disease ([place]) 11/17/2021   De Quervain's tenosynovitis, left 11/17/2021   Needs assistance with community resources 11/17/2021   Neuropathy due to type 2 diabetes mellitus ([place]) 10/12/2021   Atypical chest pain 04/17/2021   Hypokalemia 04/03/2021   COPD (chronic obstructive pulmonary disease) with emphysema ([place]) 04/03/2021   COPD with acute exacerbation ([place]) 04/02/2021   Hypothyroidism    Diabetes mellitus, type II ([place])    Generalized abdominal pain 03/28/2021   Depression 08/16/2020   PTSD (post-traumatic stress disorder) 02/09/2020   Mood disorder in conditions classified elsewhere 02/09/2020   Generalized anxiety disorder 02/09/2020   Coarse tremors 10/28/2019   Chronic rhinitis 10/21/2019   Allergic rhinitis 06/16/2019   Cervical spondylosis without myelopathy 12/18/2018   Hypertension    Encounter for general adult medical examination with abnormal findings 11/06/2018   Smokers' cough ([place]) 01/15/2018   Pulmonary nodule 11/11/2017   Carotid artery disease ([place]) 11/05/2017   OSA on CPAP 08/07/2017   Gastroesophageal reflux disease 06/23/2017   Hypothyroidism due to acquired atrophy of thyroid 04/05/2017   Primary osteoarthritis involving multiple joints 04/05/2017   Chronic constipation 02/10/2017   Iron deficiency anemia due to dietary causes 02/10/2017   Osteoarthritis 02/10/2017   Hyperlipidemia associated with type 2 diabetes mellitus ([place]) 02/10/2017   Chronic respiratory failure with hypoxia ([place]) 12/16/2016   Essential hypertension 11/29/2016   Vocal cord dysfunction    Anxiety 09/24/2016   Past Medical History:   Diagnosis Date   Acute respiratory failure with hypoxia ([place])    Alcoholism and drug addiction in family    Anxiety    Appendicitis    Arthritis    "knees, hips, lower back" (09/25/2016)   Asthma    Bipolar disorder ([place])    Chronic bronchitis ([place])    "all the time" (09/25/2016)   Chronic leg pain    RLE   Chronic lower back pain    COPD (chronic obstructive pulmonary disease) ([place])    Depression    Diabetes mellitus without complication (HCC)    Diabetic neuropathy ([place])    Early cataracts, bilateral    Emphysema of lung ([place])    Generalized anxiety disorder 02/09/2020   GERD (gastroesophageal reflux disease)    Headache    Hepatitis C    "treated back in 2001-2002"   High cholesterol    Hypertension    Hypothyroidism    Incarcerated ventral hernia 04/04/2017   Mood disorder in conditions classified elsewhere 02/09/2020   On home oxygen therapy    "2L; at nighttime" (04/04/2017)   OSA on CPAP    CPAP with Oxygen 3 liters   Paranoid ([place])    Pneumonia    "all the time" (09/25/2016)   Positive PPD    with negative chest x ray   PTSD (post-traumatic stress disorder) 02/09/2020   Tachycardia    patient reports with exertion ; denies any other conditions    Thyroid disease    Wears glasses    Wears partial dentures     Past Surgical History:  Procedure Laterality Date   APPENDECTOMY     BACK SURGERY  BIOPSY  11/24/2018   Procedure: BIOPSY;  Surgeon: Thornton Park, MD;  Location: [facility];  Service: Gastroenterology;;   CARDIAC CATHETERIZATION     COLONOSCOPY     COLONOSCOPY WITH PROPOFOL N/A 11/24/2018   Procedure: COLONOSCOPY WITH PROPOFOL;  Surgeon: Thornton Park, MD;  Location: [facility];  Service: Gastroenterology;  Laterality: N/A;   CYST EXCISION     removal of cyst in sinuses   DILATION AND CURETTAGE OF UTERUS     ESOPHAGOGASTRODUODENOSCOPY (EGD) WITH PROPOFOL N/A 11/24/2018   Procedure: ESOPHAGOGASTRODUODENOSCOPY (EGD) WITH PROPOFOL;   Surgeon: Thornton Park, MD;  Location: [facility];  Service: Gastroenterology;  Laterality: N/A;   FOOT SURGERY Bilateral    bone removed from 4th and 5 th toes and put in pin then took pin out   [place]  ~ 2014/2015   "removed mesh & infection"   [place] N/A 04/04/2017   Procedure: LAPAROSCOPIC INCISIONAL HERNIA REPAIR WITH MESH;  Surgeon: Fanny Skates, MD;  Location: [facility];  Service: General;  Laterality: N/A;   LACERATION REPAIR Right    repair wrist artery from laceration from ice skate   LAPAROSCOPIC APPENDECTOMY N/A 03/31/2019   Procedure: APPENDECTOMY LAPAROSCOPIC;  Surgeon: Ralene Ok, MD;  Location: [facility];  Service: General;  Laterality: N/A;   LAPAROSCOPIC APPENDECTOMY N/A 07/09/2019   Procedure: APPENDECTOMY LAPAROSCOPIC;  Surgeon: Ralene Ok, MD;  Location: [facility];  Service: General;  Laterality: N/A;   LAPAROSCOPIC INCISIONAL / UMBILICAL / [place]  04/04/2017   LAPAROSCOPIC INCISIONAL HERNIA REPAIR WITH MESH   LIPOMA EXCISION Right    fattty lipoma in neck   NASAL SEPTUM SURGERY     POSTERIOR LUMBAR FUSION  2015/2016 X 2   "added rods and screws"   SINOSCOPY     TOTAL HIP ARTHROPLASTY Right 02/05/2017   Procedure: RIGHT TOTAL HIP ARTHROPLASTY;  Surgeon: [name], MD;  Location: [facility];  Service: Orthopedics;  Laterality: Right;   TOTAL HIP ARTHROPLASTY Left 06/18/2017   Procedure: LEFT TOTAL HIP ARTHROPLASTY;  Surgeon: [name], MD;  Location: [facility];  Service: Orthopedics;  Laterality: Left;   TOTAL HIP REVISION Left 01/26/2019   Procedure: TOTAL HIP REVISION;  Surgeon: Paralee Cancel, MD;  Location: [facility];  Service: Orthopedics;  Laterality: Left;  2 hrs   TUBAL LIGATION     UMBILICAL HERNIA REPAIR  ~ 2013   w/mesh   [facility] ENDOSCOPY      Medications Prior to Admission  Medication Sig Dispense Refill Last Dose   acyclovir (ZOVIRAX) 400 MG tablet TAKE 1 TABLET BY MOUTH   TWICE DAILY (Patient taking differently: Take 400 mg by mouth in the morning.) 180 tablet 3 02/14/2022 at 0430   albuterol (PROVENTIL) (2.5 MG/3ML) 0.083% nebulizer solution Take 3 mLs (2.5 mg total) by nebulization 2 (two) times daily. And every 6 hours as needed.  DX: J44.9 360 mL 3 02/14/2022 at 0430   atenolol (TENORMIN) 25 MG tablet TAKE 1 TABLET BY MOUTH  DAILY 90 tablet 3 02/14/2022 at 0430   atorvastatin (LIPITOR) 20 MG tablet TAKE 1 TABLET BY MOUTH ONCE  DAILY 100 tablet 2 02/14/2022 at 0430   Budeson-Glycopyrrol-Formoterol (BREZTRI AEROSPHERE) 160-9-4.8 MCG/ACT AERO Inhale 2 puffs into the lungs in the morning and at bedtime. 10.7 g 0 02/14/2022 at 0430   buPROPion (WELLBUTRIN XL) 300 MG 24 hr tablet Take 1 tablet (300 mg total) by mouth daily. 90 tablet 1 02/14/2022 at  0430   busPIRone (BUSPAR) 5 MG tablet TAKE 1/2 TO 1 TABLET BY MOUTH  TWICE DAILY AS NEEDED (Patient taking differently: Take 5 mg by mouth at bedtime.) 180 tablet 0 02/14/2022 at 0430   Camphor-Menthol-Methyl Sal (SALONPAS) 3.03-02-08 % PTCH Place 1 patch onto the skin daily as needed (neck pain).   Past Week   cetirizine (ZYRTEC) 10 MG tablet Take 1 tablet (10 mg total) by mouth 2 (two) times daily. (Patient taking differently: Take 10 mg by mouth daily.) 180 tablet 3 02/14/2022 at 0430   cyclobenzaprine (FLEXERIL) 10 MG tablet TAKE 1 TABLET BY MOUTH AT  BEDTIME 90 tablet 1 02/13/2022   diphenhydrAMINE (BENADRYL) 25 mg capsule Take 25 mg by mouth 2 (two) times daily.   02/14/2022 at 0430   fluticasone (FLONASE) 50 MCG/ACT nasal spray Place 2 sprays into both nostrils daily. (Patient taking differently: Place 2 sprays into both nostrils daily as needed for allergies.) 48 g 1    hydrochlorothiazide (HYDRODIURIL) 25 MG tablet TAKE 1 TABLET BY MOUTH DAILY 100 tablet 2 02/13/2022   Ipratropium-Albuterol (COMBIVENT RESPIMAT) 20-100 MCG/ACT AERS respimat Inhale 1 puff into the lungs every 6 (six) hours as needed for wheezing or  shortness of breath. 4 g 11    levothyroxine (SYNTHROID) 25 MCG tablet TAKE 1 TABLET BY MOUTH DAILY  BEFORE BREAKFAST 100 tablet 2 02/14/2022 at 0430   montelukast (SINGULAIR) 10 MG tablet TAKE 1 TABLET BY MOUTH AT  BEDTIME 100 tablet 2 02/13/2022   nystatin-triamcinolone ointment (MYCOLOG) Apply 1 application topically 2 (two) times daily as needed (applied to skin folds/groins/under breast skin irritation rash.). 100 g 3    pantoprazole (PROTONIX) 40 MG tablet TAKE 1 TABLET BY MOUTH DAILY 100 tablet 3 02/14/2022 at 0430   polyethylene glycol (MIRALAX / GLYCOLAX) 17 g packet Take 17 g by mouth daily as needed for mild constipation. 28 packet 0 Past Week   pregabalin (LYRICA) 300 MG capsule Take 1 capsule (300 mg total) by mouth 2 (two) times daily. 60 capsule 3 Past Month   QUEtiapine (SEROQUEL) 50 MG tablet TAKE 1 TABLET BY MOUTH AT  BEDTIME 30 tablet 11 02/13/2022   roflumilast (DALIRESP) 500 MCG TABS tablet Take 1 tablet (500 mcg total) by mouth daily. 30 tablet 11 02/14/2022 at 0430   traZODone (DESYREL) 100 MG tablet TAKE 1 TABLET BY MOUTH AT  BEDTIME 100 tablet 3 02/13/2022   venlafaxine XR (EFFEXOR-XR) 150 MG 24 hr capsule TAKE 1 CAPSULE BY MOUTH DAILY  WITH BREAKFAST 100 capsule 3 02/14/2022 at 0430   Accu-Chek Softclix Lancets lancets Use as instructed 100 each 12    blood glucose meter kit and supplies Dispense based on patient and insurance preference. Use up to two times daily as directed. (FOR ICD-10 E10.9, E11.9). 1 each 0    glucose blood test strip Use as instructed 100 each 12    MELATONIN PO Take 1 tablet by mouth at bedtime as needed (sleep).   More than a month   OXYGEN Inhale 3 L/min into the lungs See admin instructions. Inhale  3 L/min of oxygen into the lungs w/CPAP at bedtime and as needed for shortness of breath with "strenuous activity" during the day      PATADAY 0.2 % SOLN Place 1 drop into both eyes daily.   More than a month   silver sulfADIAZINE (SILVADENE) 1 % cream  Apply 1 Application topically daily. (Patient not taking: Reported on 02/07/2022) 50 g 1 Not Taking  Varenicline Tartrate, Starter, (CHANTIX STARTING MONTH PAK) 0.5 MG X 11 & 1 MG X 42 TBPK Take 0.5 mg by mouth daily. (Patient not taking: Reported on 02/07/2022) 53 each 0 Not Taking   witch hazel-glycerin (TUCKS) pad Apply topically as needed for itching.      Allergies  Allergen Reactions   Keflex [Cephalexin] Rash and Other (See Comments)    Has tolerated amoxicillin since had keflex   Shellfish Allergy Shortness Of Breath, Nausea And Vomiting and Other (See Comments)    Stomach cramps   Tetracyclines & Related Anaphylaxis, Swelling and Other (See Comments)    Throat swelling requiring hospitalization   Dilaudid [Hydromorphone Hcl] Other (See Comments)    "Lethargy"   Prednisone Other (See Comments)    "counter reacts", has tolerated IV medrol   Clindamycin/Lincomycin Other (See Comments)    Burning sensation in her mouth down her throat    Incruse Ellipta [Umeclidinium Bromide] Nausea Only   Molds & Smuts Itching   Tuberculin Tests Other (See Comments)    False postive    Social History   Tobacco Use   Smoking status: Every Day    Packs/day: 1.00    Years: 44.00    Total pack years: 44.00    Types: Cigarettes    Passive exposure: Current   Smokeless tobacco: Never   Tobacco comments:    1 cig per day 01/29/22  Substance Use Topics   Alcohol use: Not Currently    Family History  Problem Relation Age of Onset   Diabetes Mother    Hypertension Mother    Hypertension Father    Cancer Father        lung   Breast cancer Neg Hx      Objective:   Patient Vitals for the past 8 hrs:  BP Temp Temp src Pulse Resp SpO2 Height Weight  02/14/22 0610 114/82 98.2 F (36.8 C) Oral 76 17 94 % _0  (1.727 m) 83.9 kg   No intake/output data recorded. No intake/output data recorded.    General : Alert, cooperative, no distress, appears stated age. Voice hoarse.   Head:   Normocephalic/atraumatic    Eyes: PERRL, conjunctiva/corneas clear, EOM's intact. Fundi could not be visualized Neck: Supple Chest:  Respirations unlabored Chest wall: no tenderness or deformity Heart: Regular rate and rhythm Abdomen: Soft, nontender and nondistended Extremities: warm and well-perfused Skin: normal turgor, color and texture Neurologic:  Alert, oriented x 3.  Eyes open spontaneously. PERRL, EOMI, VFC, no facial droop. V1-3 intact.  No dysarthria, tongue protrusion symmetric.  CNII-XII intact. Normal strength, sensation and reflexes throughout.  No pronator drift, full strength in legs       Data ReviewRadiology review:  Cervical stenosis most prominent at C3-4  Assessment:   Active Problems:   * No active hospital problems. * Cervical stenosis with right > left cervical radiculopathy  Plan:   - ACDF today

## 2022-02-14 NOTE — Anesthesia Postprocedure Evaluation (Signed)
Anesthesia Post Note  Patient: Andelyn Spade  Procedure(s) Performed: Anterior Cervical Decompression/Discectomy Fusion Cervical Three-Cervical Four     Patient location during evaluation: PACU Anesthesia Type: General Level of consciousness: awake and alert Pain management: pain level controlled Vital Signs Assessment: post-procedure vital signs reviewed and stable Respiratory status: spontaneous breathing, nonlabored ventilation, respiratory function stable and patient connected to nasal cannula oxygen Cardiovascular status: blood pressure returned to baseline and stable Postop Assessment: no apparent nausea or vomiting Anesthetic complications: no  No notable events documented.  Last Vitals:  Vitals:   02/14/22 1115 02/14/22 1142  BP: 103/70 107/73  Pulse: 66 68  Resp: 19 20  Temp:  36.4 C  SpO2: 94% 96%    Last Pain:  Vitals:   02/14/22 1142  TempSrc: Oral  PainSc:                  Kijana Cromie,W. EDMOND

## 2022-02-14 NOTE — Op Note (Signed)
PREOP DIAGNOSIS: cervical radiculopathy  POSTOP DIAGNOSIS: cervical radiculopathy  PROCEDURE: 1. Arthrodesis C3-4, anterior interbody technique, including Discectomy for decompression of spinal cord and exiting nerve roots with foraminotomies  2. Placement of intervertebral biomechanical device C3-4 3. Placement of anterior instrumentation consisting of interbody plate and screws I1-4 4. Use of morselized bone allograft  5. Use of intraoperative microscope  SURGEON: Dr. Duffy Rhody, MD  ASSISTANT: none.    ANESTHESIA: General Endotracheal  EBL: 25 ml  IMPLANTS: Medtronic 8 x 16 x 14 mm Titan-C cage 19 mm plate 15 mm screws x 4  SPECIMENS: None  DRAINS: None  COMPLICATIONS: None immediate  CONDITION: Hemodynamically stable to PACU  HISTORY: Joan [name] is a 61 y.o. y.o. female with COPD who developed refractory right cervical radiculopathy.  Her symptoms persisted despite PT, medical therapy, ESI and medial branch injections.  Her pain distribution was consistent with C4 radiculopathy and there was right C3-4 foraminal stenosis with nerve impingement apparent from paracentral disc-osteophyte.  I had a long discussion with her and her family.  With her medical issues, she was at higher risk of surgery, and additionally, C4 radiculopathy can be difficult to diagnose clinically so there was possibility that surgery would only provide modest benefit.  As such, we would only consider this an option if she really felt her quality of life was very poor due to her symptoms, which she confirmed was the case  Risks, benefits, alternatives, and expected convalescence were discussed with the patient.  Risks discussed included but were not limited to bleeding, pain, infection, dysphagia, dysphonia, pseudoarthrosis, hardware failure, adjacent segment disease, CSF leak, neurologic deficits, weakness, numbness, paralysis, coma, and death. After all questions were answered, informed  consent was obtained.  PROCEDURE IN DETAIL: The patient was brought to the operating room and transferred to the operative table. After induction of general anesthesia, the patient was positioned on the operative table in the supine position with all pressure points meticulously padded. The skin of the neck was then prepped and draped in the usual sterile fashion.  After timeout was conducted, the skin was infiltrated with local anesthetic. Skin incision was then made sharply and Bovie electrocautery was used to dissect the subcutaneous tissue until the platysma was identified. The platysma was then divided and undermined. The sternocleidomastoid muscle was then identified and, utilizing natural fascial planes in the neck, the prevertebral fascia was identified and the carotid sheath was retracted laterally and the trachea and esophagus retracted medially. Again using fluoroscopy, the C3-4 disc space was identified. Bovie electrocautery was used to dissect in the subperiosteal plane and elevate the bilateral longus coli muscles. Self-retaining retractors were then placed.  At this point, the microscope was draped and brought into the field, and the remainder of the case was done under the microscope using microdissecting technique.  The disc space was incised sharply and combination of high speed drill, curettes, and rongeurs were use to initially complete a discectomy. The high-speed drill was then used to complete discectomy until the posterior annulus was identified and removed and the posterior longitudinal ligament was identified. Using a nerve hook, the PLL was elevated, and Kerrison rongeurs were used to remove the posterior longitudinal ligament and the ventral thecal sac was identified. Large right foraminal disc osteophyte was encountered and removedUsing a combination of curettes and rongeurs, complete decompression of the thecal sac and exiting nerve roots at this level was completed, and verified  with easy passage of micro-nerve hook centrally and in  the bilateral foramina.  Having completed our decompression, attention was turned to placement of the intervertebral device. Trial spacers were used to select a size 8 mm graft. This graft was then filled with morcellized allograft, and inserted under live fluoroscopy.  After placement of the intervertebral device, an anterior cervical plate a was placed across the interspaces for anterior fixation.  Using a high-speed drill, the cortex of the cervical vertebral bodies was punctured, and screws inserted in the vertebral bodies. Final fluoroscopic images in AP and lateral projections were taken to confirm good hardware placement.  At this point, after all counts were verified to be correct, meticulous hemostasis was secured using a combination of bipolar electrocautery and passive hemostatics. The platysma muscle was then closed using interrupted 3-0 Vicryl sutures, and the skin was closed with a 4-0 monocryl in subcutical fashion. Sterile dressings were then applied and the drapes removed.  The patient tolerated the procedure well and was extubated in the room.  I was called back to the OR for a small amount of oozing from the wound after the patient had a coughing spell following extubation.  I removed the dressing and examined the wound.  There was no ongoing oozing.  The neck was soft and there was no evidence of underlying hematoma.  Dressing was reapplied and patient was and taken to the postanesthesia care unit in stable condition.  All counts were correct at the end of the procedure.

## 2022-02-14 NOTE — Anesthesia Procedure Notes (Signed)
Procedure Name: Intubation Date/Time: 02/14/2022 8:00 AM  Performed by: Anastasio Auerbach, CRNAPre-anesthesia Checklist: Patient identified, Emergency Drugs available, Suction available and Patient being monitored Patient Re-evaluated:Patient Re-evaluated prior to induction Oxygen Delivery Method: Circle system utilized Preoxygenation: Pre-oxygenation with 100% oxygen Induction Type: IV induction Ventilation: Mask ventilation without difficulty Laryngoscope Size: Glidescope and 3 Grade View: Grade II Tube type: Oral Tube size: 7.5 mm Number of attempts: 1 Airway Equipment and Method: Stylet and Oral airway Placement Confirmation: ETT inserted through vocal cords under direct vision, positive ETCO2 and breath sounds checked- equal and bilateral Secured at: 21 cm Tube secured with: Tape Dental Injury: Teeth and Oropharynx as per pre-operative assessment  Comments: . Head, neck, shoulders neutral at all times.

## 2022-02-15 ENCOUNTER — Encounter (HOSPITAL_COMMUNITY): Payer: Self-pay | Admitting: Neurosurgery

## 2022-02-15 DIAGNOSIS — M4722 Other spondylosis with radiculopathy, cervical region: Secondary | ICD-10-CM | POA: Diagnosis not present

## 2022-02-15 LAB — GLUCOSE, CAPILLARY: Glucose-Capillary: 268 mg/dL — ABNORMAL HIGH (ref 70–99)

## 2022-02-15 MED ORDER — OXYCODONE-ACETAMINOPHEN 5-325 MG PO TABS: 1.0000 | ORAL_TABLET | Freq: Four times a day (QID) | ORAL | 0 refills | Status: DC | PRN

## 2022-02-15 MED ORDER — INSULIN ASPART 100 UNIT/ML IJ SOLN: 0.0000 [IU] | Freq: Every day | INTRAMUSCULAR | Status: DC

## 2022-02-15 MED ORDER — DIAZEPAM 5 MG PO TABS: 5.0000 mg | ORAL_TABLET | Freq: Three times a day (TID) | ORAL | 0 refills | Status: DC | PRN

## 2022-02-15 MED ORDER — INSULIN ASPART 100 UNIT/ML IJ SOLN
0.0000 [IU] | Freq: Three times a day (TID) | INTRAMUSCULAR | Status: DC
  Administered 2022-02-15: 8 [IU] via SUBCUTANEOUS

## 2022-02-15 MED ORDER — INSULIN ASPART 100 UNIT/ML IJ SOLN: 0.0000 [IU] | Freq: Three times a day (TID) | INTRAMUSCULAR | Status: DC

## 2022-02-15 NOTE — Evaluation (Addendum)
Occupational Therapy Evaluation and Discharge Patient Details Name: Joan [name] MRN: [id number] DOB: 06-10-1960 Today's Date: 02/15/2022   History of Present Illness Joan [name]  is 61 yo female s/p C3-4 anterior fusion. PHMx: anxiety, bipolar disorder, COPD, depression, DM2 with neuropathy, hypothyroidism, GERD, on home O2 3L   Clinical Impression   This 61 yo female admitted and underwent above presents to acute OT with all education completed, we will D/C from acute OT. No skilled PT needs identified--have made them aware.     Recommendations for follow up therapy are one component of a multi-disciplinary discharge planning process, led by the attending physician.  Recommendations may be updated based on patient status, additional functional criteria and insurance authorization.   Follow Up Recommendations  No OT follow up     Assistance Recommended at Discharge PRN  Patient can return home with the following Assistance with cooking/housework    Functional Status Assessment  Patient has had a recent decline in their functional status and demonstrates the ability to make significant improvements in function in a reasonable and predictable amount of time. (without further need for OT)  Equipment Recommendations  None recommended by OT       Precautions / Restrictions Precautions Precautions: Cervical Precaution Booklet Issued: Yes (comment) Required Braces or Orthoses: Cervical Brace Cervical Brace: Soft collar (off for bathing/dressing) Restrictions Weight Bearing Restrictions: No      Mobility Bed Mobility Overal bed mobility: Independent                  Transfers Overall transfer level: Independent                        Balance Overall balance assessment: Modified Independent                                         ADL either performed or assessed with clinical judgement   ADL Overall ADL's : Modified independent                                        General ADL Comments: Educated on wear and care of collar, donning clothing, mouth care/eating and protecting collar, refrain from extreme movement of your neck (looking over shoulder, looking up at sky, looking down at floor)     Vision Baseline Vision/History: 1 Wears glasses Patient Visual Report: No change from baseline              Pertinent Vitals/Pain Pain Assessment Pain Assessment: No/denies pain     Hand Dominance Right   Extremity/Trunk Assessment Upper Extremity Assessment Upper Extremity Assessment: Overall WFL for tasks assessed           Communication Communication Communication: No difficulties   Cognition Arousal/Alertness: Awake/alert Behavior During Therapy: WFL for tasks assessed/performed Overall Cognitive Status: Within Functional Limits for tasks assessed                                       General Comments  uses [facility] for ambulation            Home Living Family/patient expects to be discharged to:: Private residence Living Arrangements: Alone   Type of Home: House  Home Access: Level entry     Home Layout: One level     Bathroom Shower/Tub: Teacher, early years/pre: Standard     Home Equipment: Rollator (4 wheels);Cane - single point;Shower seat;Wheelchair - TEFL teacher Comments: Home O2-3 liters, adjustable bed      Prior Functioning/Environment Prior Level of Function : Independent/Modified Independent               ADLs Comments: Pt stating that she completes all ADLs and iADLs independently with increased time due to increased WOB with activity        OT Problem List: Impaired balance (sitting and/or standing);Cardiopulmonary status limiting activity (has to take her time and slowly)         OT Goals(Current goals can be found in the care plan section) Acute Rehab OT Goals Patient Stated Goal: to go home  and get some help for household tasks OT Goal Formulation: With patient         AM-PAC OT "6 Clicks" Daily Activity     Outcome Measure Help from another person eating meals?: None Help from another person taking care of personal grooming?: None Help from another person toileting, which includes using toliet, bedpan, or urinal?: None Help from another person bathing (including washing, rinsing, drying)?: None Help from another person to put on and taking off regular upper body clothing?: None Help from another person to put on and taking off regular lower body clothing?: None 6 Click Score: 24   End of Session Equipment Utilized During Treatment:  [facility]) Nurse Communication:  (no further OT needs)  Activity Tolerance: Patient tolerated treatment well Patient left:  (sitting on EOB)  OT Visit Diagnosis: Unsteadiness on feet (R26.81);Muscle weakness (generalized) (M62.81)                Time: 4287-6811 OT Time Calculation (min): 24 min Charges:  OT General Charges $OT Visit: 1 Visit OT Evaluation $OT Eval Moderate Complexity: 1 Mod OT Treatments $Self Care/Home Management : 8-22 mins  02/15/2022  Golden Circle, OTR/L Acute Rehab [facility] [phone] Office [phone]      Almon Register 02/15/2022, 10:12 AM

## 2022-02-15 NOTE — Progress Notes (Signed)
PT Cancellation/DC Note  Patient Details Name: Joan [name] MRN: [id number] DOB: 05/08/1960   Cancelled Treatment:    Reason Eval/Treat Not Completed: PT screened, no needs identified, will sign off Pt observed by PT walking in hallways with cane without issue.  OT completed full evaluation and reports the patient has no need for PT at this time.  Will sign off.    Melvern Banker 02/15/2022, 10:20 AM  Lavonia Dana, PT   Acute Rehabilitation Services  Office [phone] 02/15/2022

## 2022-02-15 NOTE — Plan of Care (Signed)
  Problem: Education: Goal: Ability to verbalize activity precautions or restrictions will improve Outcome: Completed/Met Goal: Knowledge of the prescribed therapeutic regimen will improve Outcome: Completed/Met Goal: Understanding of discharge needs will improve Outcome: Completed/Met   Problem: Activity: Goal: Ability to avoid complications of mobility impairment will improve Outcome: Completed/Met Goal: Ability to tolerate increased activity will improve Outcome: Completed/Met Goal: Will remain free from falls Outcome: Completed/Met   Problem: Bowel/Gastric: Goal: Gastrointestinal status for postoperative course will improve Outcome: Completed/Met  Patient alert and oriented, ambulate, void, surgical site clean and dry. D/c instructions explain and copy given to the patient all questions answered.Patient d/c home per order.

## 2022-02-15 NOTE — Progress Notes (Signed)
OT Cancellation Note  Patient Details Name: Joan [name] MRN: [id number] DOB: 13-Nov-1960   Cancelled Treatment:    Reason Eval/Treat Not Completed: Other (comment). Have attempted to see pt twice--still in process of eating breakfast.  Golden Circle, OTR/L Acute Re[facility] [phone] Office [phone]    Almon Register 02/15/2022, 9:10 AM

## 2022-02-15 NOTE — Discharge Summary (Signed)
Physician Discharge Summary  Patient ID: Joan [name] MRN: [id number] DOB/AGE: Mar 31, 1960 61 y.o.  Admit date: 02/14/2022 Discharge date: 02/15/2022  Admission Diagnoses:  Cervical radiculopathy  Discharge Diagnoses:  Same Principal Problem:   Cervical radiculopathy   Discharged Condition: Stable  Hospital Course:  Janye Maynor is a 61 y.o. female with COPD who underwent elective C3-4 ACDF.  She was admitted to the hospital postoperatively for monitoring.  Her pain was well-controlled with PO pain medications.  She had mild expected dysphagia.  She was deemed ready for discharge home on 12/22  Treatments: Surgery - C3-4 ACDF  Discharge Exam: Blood pressure 128/75, pulse 70, temperature 97.7 F (36.5 C), temperature source Oral, resp. rate 18, height _0  (1.727 m), weight 83.9 kg, SpO2 94 %. Awake, alert, oriented Speech fluent, appropriate CN grossly intact 5/5 BUE/BLE Wound c/d/I Walking with cane  Disposition: Discharge disposition: 01-Home or Self Care       Discharge Instructions     Incentive spirometry RT   Complete by: As directed       Allergies as of 02/15/2022       Reactions   Keflex [cephalexin] Rash, Other (See Comments)   Has tolerated amoxicillin since had keflex   Shellfish Allergy Shortness Of Breath, Nausea And Vomiting, Other (See Comments)   Stomach cramps   Tetracyclines & Related Anaphylaxis, Swelling, Other (See Comments)   Throat swelling requiring hospitalization   Dilaudid [hydromorphone Hcl] Other (See Comments)   "Lethargy"   Prednisone Other (See Comments)   "counter reacts", has tolerated IV medrol   Clindamycin/lincomycin Other (See Comments)   Burning sensation in her mouth down her throat    Incruse Ellipta [umeclidinium Bromide] Nausea Only   Molds & Smuts Itching   Tuberculin Tests Other (See Comments)   False postive        Medication List     TAKE these medications    Accu-Chek Softclix  Lancets lancets Use as instructed   acyclovir 400 MG tablet Commonly known as: ZOVIRAX TAKE 1 TABLET BY MOUTH  TWICE DAILY What changed: when to take this   albuterol (2.5 MG/3ML) 0.083% nebulizer solution Commonly known as: PROVENTIL Take 3 mLs (2.5 mg total) by nebulization 2 (two) times daily. And every 6 hours as needed.  DX: J44.9   atenolol 25 MG tablet Commonly known as: TENORMIN TAKE 1 TABLET BY MOUTH  DAILY   atorvastatin 20 MG tablet Commonly known as: LIPITOR TAKE 1 TABLET BY MOUTH ONCE  DAILY   blood glucose meter kit and supplies Dispense based on patient and insurance preference. Use up to two times daily as directed. (FOR ICD-10 E10.9, E11.9).   Breztri Aerosphere 160-9-4.8 MCG/ACT Aero Generic drug: Budeson-Glycopyrrol-Formoterol Inhale 2 puffs into the lungs in the morning and at bedtime.   buPROPion 300 MG 24 hr tablet Commonly known as: WELLBUTRIN XL Take 1 tablet (300 mg total) by mouth daily.   busPIRone 5 MG tablet Commonly known as: BUSPAR TAKE 1/2 TO 1 TABLET BY MOUTH  TWICE DAILY AS NEEDED What changed: See the new instructions.   cetirizine 10 MG tablet Commonly known as: ZYRTEC Take 1 tablet (10 mg total) by mouth 2 (two) times daily. What changed: when to take this   Combivent Respimat 20-100 MCG/ACT Aers respimat Generic drug: Ipratropium-Albuterol Inhale 1 puff into the lungs every 6 (six) hours as needed for wheezing or shortness of breath.   cyclobenzaprine 10 MG tablet Commonly known as: FLEXERIL TAKE 1 TABLET  BY MOUTH AT  BEDTIME   diazepam 5 MG tablet Commonly known as: Valium Take 1 tablet (5 mg total) by mouth every 8 (eight) hours as needed for anxiety.   diphenhydrAMINE 25 mg capsule Commonly known as: BENADRYL Take 25 mg by mouth 2 (two) times daily.   fluticasone 50 MCG/ACT nasal spray Commonly known as: FLONASE Place 2 sprays into both nostrils daily. What changed:  when to take this reasons to take this    glucose blood test strip Use as instructed   hydrochlorothiazide 25 MG tablet Commonly known as: HYDRODIURIL TAKE 1 TABLET BY MOUTH DAILY   levothyroxine 25 MCG tablet Commonly known as: SYNTHROID TAKE 1 TABLET BY MOUTH DAILY  BEFORE BREAKFAST   MELATONIN PO Take 1 tablet by mouth at bedtime as needed (sleep).   montelukast 10 MG tablet Commonly known as: SINGULAIR TAKE 1 TABLET BY MOUTH AT  BEDTIME   nystatin-triamcinolone ointment Commonly known as: MYCOLOG Apply 1 application topically 2 (two) times daily as needed (applied to skin folds/groins/under breast skin irritation rash.).   oxyCODONE-acetaminophen 5-325 MG tablet Commonly known as: Percocet Take 1-2 tablets by mouth every 6 (six) hours as needed for severe pain.   OXYGEN Inhale 3 L/min into the lungs See admin instructions. Inhale  3 L/min of oxygen into the lungs w/CPAP at bedtime and as needed for shortness of breath with "strenuous activity" during the day   pantoprazole 40 MG tablet Commonly known as: PROTONIX TAKE 1 TABLET BY MOUTH DAILY   Pataday 0.2 % Soln Generic drug: Olopatadine HCl Place 1 drop into both eyes daily.   polyethylene glycol 17 g packet Commonly known as: MIRALAX / GLYCOLAX Take 17 g by mouth daily as needed for mild constipation.   pregabalin 300 MG capsule Commonly known as: Lyrica Take 1 capsule (300 mg total) by mouth 2 (two) times daily.   QUEtiapine 50 MG tablet Commonly known as: SEROQUEL TAKE 1 TABLET BY MOUTH AT  BEDTIME   roflumilast 500 MCG Tabs tablet Commonly known as: DALIRESP Take 1 tablet (500 mcg total) by mouth daily.   Salonpas 3.03-02-08 % Ptch Generic drug: Camphor-Menthol-Methyl Sal Place 1 patch onto the skin daily as needed (neck pain).   traZODone 100 MG tablet Commonly known as: DESYREL TAKE 1 TABLET BY MOUTH AT  BEDTIME   venlafaxine XR 150 MG 24 hr capsule Commonly known as: EFFEXOR-XR TAKE 1 CAPSULE BY MOUTH DAILY  WITH BREAKFAST   witch  hazel-glycerin pad Commonly known as: TUCKS Apply topically as needed for itching.         Signed: Vallarie Mare 02/15/2022, 8:16 AM

## 2022-02-15 NOTE — Discharge Instructions (Signed)
Wound Care Remove dressing in 3 days Leave incision open to air. You may shower. Do not scrub directly on incision.  Do not put any creams, lotions, or ointments on incision. Activity Walk each and every day, increasing distance each day. No lifting greater than 5 lbs.  Avoid excessive neck twisting No driving for 2 weeks; may ride as a passenger locally. If provided with back brace, wear when out of bed.  It is not necessary to wear in bed. Diet Resume your normal diet.   Call Your Doctor If Any of These Occur Redness, drainage, or swelling at the wound.  Temperature greater than 101 degrees. Severe pain not relieved by pain medication. Incision starts to come apart. Follow Up Appt Call  [phone]) for problems.  If you have any hardware placed in your spine, you will need an x-ray before your appointment.

## 2022-02-21 ENCOUNTER — Encounter (HOSPITAL_COMMUNITY): Payer: Self-pay

## 2022-02-21 ENCOUNTER — Ambulatory Visit (HOSPITAL_COMMUNITY): Payer: Medicare Other | Admitting: Licensed Clinical Social Worker

## 2022-02-21 NOTE — Progress Notes (Signed)
Therapist contacted patient by My Chart and she did not respond session is a no show

## 2022-02-25 DIAGNOSIS — I10 Essential (primary) hypertension: Secondary | ICD-10-CM | POA: Diagnosis not present

## 2022-02-25 DIAGNOSIS — E039 Hypothyroidism, unspecified: Secondary | ICD-10-CM | POA: Diagnosis not present

## 2022-02-25 DIAGNOSIS — E785 Hyperlipidemia, unspecified: Secondary | ICD-10-CM | POA: Diagnosis not present

## 2022-02-25 DIAGNOSIS — E1169 Type 2 diabetes mellitus with other specified complication: Secondary | ICD-10-CM | POA: Diagnosis not present

## 2022-02-25 DIAGNOSIS — E1142 Type 2 diabetes mellitus with diabetic polyneuropathy: Secondary | ICD-10-CM | POA: Diagnosis not present

## 2022-02-25 DIAGNOSIS — Z9981 Dependence on supplemental oxygen: Secondary | ICD-10-CM | POA: Diagnosis not present

## 2022-02-25 DIAGNOSIS — Z8639 Personal history of other endocrine, nutritional and metabolic disease: Secondary | ICD-10-CM | POA: Diagnosis not present

## 2022-02-25 DIAGNOSIS — G4733 Obstructive sleep apnea (adult) (pediatric): Secondary | ICD-10-CM | POA: Diagnosis not present

## 2022-02-25 DIAGNOSIS — Z7901 Long term (current) use of anticoagulants: Secondary | ICD-10-CM | POA: Diagnosis not present

## 2022-02-25 DIAGNOSIS — E871 Hypo-osmolality and hyponatremia: Secondary | ICD-10-CM | POA: Diagnosis not present

## 2022-02-25 DIAGNOSIS — J441 Chronic obstructive pulmonary disease with (acute) exacerbation: Secondary | ICD-10-CM | POA: Diagnosis not present

## 2022-02-26 DIAGNOSIS — E1169 Type 2 diabetes mellitus with other specified complication: Secondary | ICD-10-CM | POA: Diagnosis not present

## 2022-02-26 DIAGNOSIS — G4733 Obstructive sleep apnea (adult) (pediatric): Secondary | ICD-10-CM | POA: Diagnosis not present

## 2022-02-26 DIAGNOSIS — E785 Hyperlipidemia, unspecified: Secondary | ICD-10-CM | POA: Diagnosis not present

## 2022-02-26 DIAGNOSIS — Z7901 Long term (current) use of anticoagulants: Secondary | ICD-10-CM | POA: Diagnosis not present

## 2022-02-26 DIAGNOSIS — I10 Essential (primary) hypertension: Secondary | ICD-10-CM | POA: Diagnosis not present

## 2022-02-26 DIAGNOSIS — Z8639 Personal history of other endocrine, nutritional and metabolic disease: Secondary | ICD-10-CM | POA: Diagnosis not present

## 2022-02-26 DIAGNOSIS — E871 Hypo-osmolality and hyponatremia: Secondary | ICD-10-CM | POA: Diagnosis not present

## 2022-02-26 DIAGNOSIS — Z9981 Dependence on supplemental oxygen: Secondary | ICD-10-CM | POA: Diagnosis not present

## 2022-02-26 DIAGNOSIS — E1142 Type 2 diabetes mellitus with diabetic polyneuropathy: Secondary | ICD-10-CM | POA: Diagnosis not present

## 2022-02-26 DIAGNOSIS — E039 Hypothyroidism, unspecified: Secondary | ICD-10-CM | POA: Diagnosis not present

## 2022-02-26 DIAGNOSIS — J441 Chronic obstructive pulmonary disease with (acute) exacerbation: Secondary | ICD-10-CM | POA: Diagnosis not present

## 2022-02-27 DIAGNOSIS — J441 Chronic obstructive pulmonary disease with (acute) exacerbation: Secondary | ICD-10-CM | POA: Diagnosis not present

## 2022-02-28 DIAGNOSIS — G4733 Obstructive sleep apnea (adult) (pediatric): Secondary | ICD-10-CM | POA: Diagnosis not present

## 2022-02-28 DIAGNOSIS — E039 Hypothyroidism, unspecified: Secondary | ICD-10-CM | POA: Diagnosis not present

## 2022-02-28 DIAGNOSIS — E785 Hyperlipidemia, unspecified: Secondary | ICD-10-CM | POA: Diagnosis not present

## 2022-02-28 DIAGNOSIS — I10 Essential (primary) hypertension: Secondary | ICD-10-CM | POA: Diagnosis not present

## 2022-02-28 DIAGNOSIS — J9611 Chronic respiratory failure with hypoxia: Secondary | ICD-10-CM | POA: Diagnosis not present

## 2022-02-28 DIAGNOSIS — M150 Primary generalized (osteo)arthritis: Secondary | ICD-10-CM | POA: Diagnosis not present

## 2022-02-28 DIAGNOSIS — F32A Depression, unspecified: Secondary | ICD-10-CM | POA: Diagnosis not present

## 2022-02-28 DIAGNOSIS — J309 Allergic rhinitis, unspecified: Secondary | ICD-10-CM | POA: Diagnosis not present

## 2022-02-28 DIAGNOSIS — E1140 Type 2 diabetes mellitus with diabetic neuropathy, unspecified: Secondary | ICD-10-CM | POA: Diagnosis not present

## 2022-02-28 DIAGNOSIS — J441 Chronic obstructive pulmonary disease with (acute) exacerbation: Secondary | ICD-10-CM | POA: Diagnosis not present

## 2022-03-07 ENCOUNTER — Ambulatory Visit (INDEPENDENT_AMBULATORY_CARE_PROVIDER_SITE_OTHER): Payer: Medicare Other | Admitting: Licensed Clinical Social Worker

## 2022-03-07 DIAGNOSIS — F411 Generalized anxiety disorder: Secondary | ICD-10-CM | POA: Diagnosis not present

## 2022-03-07 DIAGNOSIS — F0630 Mood disorder due to known physiological condition, unspecified: Secondary | ICD-10-CM | POA: Diagnosis not present

## 2022-03-07 DIAGNOSIS — F4310 Post-traumatic stress disorder, unspecified: Secondary | ICD-10-CM | POA: Diagnosis not present

## 2022-03-07 DIAGNOSIS — R443 Hallucinations, unspecified: Secondary | ICD-10-CM

## 2022-03-07 NOTE — Progress Notes (Signed)
Virtual Visit via Video Note  I connected with Joan [name] on 03/07/22 at  2:00 PM EST by a video enabled telemedicine application and verified that I am speaking with the correct person using two identifiers.  Location: Patient: home Provider: home office   I discussed the limitations of evaluation and management by telemedicine and the availability of in person appointments. The patient expressed understanding and agreed to proceed.  I discussed the assessment and treatment plan with the patient. The patient was provided an opportunity to ask questions and all were answered. The patient agreed with the plan and demonstrated an understanding of the instructions.   The patient was advised to call back or seek an in-person evaluation if the symptoms worsen or if the condition fails to improve as anticipated.  I provided 52 minutes of non-face-to-face time during this encounter.  THERAPIST PROGRESS NOTE  Session Time: 2:00 PM to 2:52 PM  Participation Level: Active  Behavioral Response: CasualAlertEuthymic  Type of Therapy: Individual Therapy  Treatment Goals addressed: Work on trauma, anxiety, depression, coping  ProgressTowards Goals: Progressing-10 taking positive action steps to help with stressors as well as using therapy to process thoughts and feelings to help with coping  Interventions: Solution Focused, Strength-based, Supportive, and Other: coping  Summary: Joan [name] is a 62 y.o. female who presents with neck surgery so glad doesn't have the pain anymore. Has been in and out of the hospital. In for the surgery then had breathing problems couldn't catch breath 911 took her back to the hospital.  They told her did not have credentials to admit rescue drove her home day after end up back admitted two nights came home again back and forth with same issue. Third time told them them what to do has been going through for years and patient new to them as they are to  her. Admit for week and got treatment-Albuterol, antibiotic, lung treatment and she was one who guided them to get the right treatment. . When there vital signs are fine why can't breath. One doctor said admit figure it out. Admitted a third time new doctor and did what normally does for these episodes. Did testing. Long time ago clogged artery side of neck assume it is fine nobody said anything about it.  Think they need to treat treat the tachycardia. Diagnosis was  exacerbation of emphysema COPD but don't look at tachycardia. Coming this weekend Joan [name] have lunch together. Energy low to even stand up. Sometimes wishes didn't say things. Told her about Joan [name] big deal patient had neck surgery nobody took care of her rebuilt neck. Told sister don't not to hear that. All these people catering to Joan [name]. Still has boxes unpack and decorate don't help her. Want to look for curtains for room. At this point can't drive hopefully soon get the ok. All the time of the day haven't touched one box.  Haven't been smoking 1-2 days. Getting to be boring. Talked about going home doesn't have the stamina and energy a long process with her breathing. Joan [name], talks to more. Joan [name] not as much. Doesn't talk to son as much. Memory not right talked about things and patient says doesn't remember talking to them about that. Joan [name] wanted patient to talk to PCP about memory, also she stops in the middle of a sentence and stuttering. Doctor checked if stroke and said no.  Therapist pointed out other testing  can be done for example to neuropsych for memory testing.  Patient told [name] with drugs have to learn to live with impact. A lot of fears now when younger no fears fears of falling but patient says makes sense has had hip replacements, two rods and two screws in her back, plates in neck.   Patient has recently been in and of the hospital trouble  breathing low energy assist helpful to have an outlet to process thoughts and feelings as well as being space and support to be able to do this.  Did positive development of neck surgery which patient has wanted to do for a long time very positive and decreasing pain for her.  Had patient talk about daily life things she has plan things she does to keep busy all part of focus on activities that can enhance mood.  Was provided active listening open questions supportive interventions. Suicidal/Homicidal: No  Plan: Return again in 2 weeks.2Start trauma work, process feelings related to stressors  Diagnosis: PTSD, hallucinations, GAD, Mood Disorder in conditions classified elsewhere  Collaboration of Care: Other none needed  Patient/Guardian was advised Release of Information must be obtained prior to any record release in order to collaborate their care with an outside provider. Patient/Guardian was advised if they have not already done so to contact the registration department to sign all necessary forms in order for [place] to release information regarding their care.   Consent: Patient/Guardian gives verbal consent for treatment and assignment of benefits for services provided during this visit. Patient/Guardian expressed understanding and agreed to proceed.   [name], LCSW 03/07/2022

## 2022-03-12 ENCOUNTER — Telehealth: Payer: Self-pay | Admitting: Internal Medicine

## 2022-03-12 NOTE — Progress Notes (Deleted)
History of Present Illness Joan [name] is a 62 y.o. female current every day smoker ( 44++  pack year smoking history) with severe COPD and OSA on CPAP therapy ( 10 cm H2O). Meds -allergy listed to prednisone-this caused pedal edema, prefers Medrol when needed . She is followed by Dr. Elsworth Soho and through the lung cancer screening program.   Maintenance is Breztri CPAP: AutoCPAP DME: [employer] Modem:  Pressure: 5-12cm  Mask Brand and Size: ResMed Full Face Mask, Small, F20    02/07/2021 hfb   SATURATION QUALIFICATIONS: (This note is used to comply with regulatory documentation for home oxygen) Patient Saturations on Room Air at Rest = 96% Patient Saturations on Room Air while Ambulating = 87% Patient Saturations on 3 Liters of oxygen while Ambulating = 95%       03/12/2022 Pt. Presents for follow up after recent hospital admission for elective C3-4 ACDF on 12/21-12/22/2023. Marland Kitchen She was admitted to the hospital postoperatively for monitoring.  Her pain was well-controlled with PO pain medications.  She had mild expected dysphagia.  She was deemed ready for discharge home on 12/22. She presents for folow up today.  She states she has been doing well.     Test Results: NPSG 02/2017 >> no OSA HST  04/2017 15/h     02/2019 pFTS >> RATIO 45, fev 1 37% - UNCHANGED , + bd response, DLCO 44%   10/2016 PFTs showed ratio 49, FEV1 of 36%, FVC 58% and DLCO of 34% consistent with severe obstruction.   04/2021 LDCT >> stable, RADS 2   02/2020 LDCT >>  stable nodules  11/2016 CT chest bullous changes BL, 63m RUl & 2 mm LUL  nodules >> stable      ONO 11/2016 on CPAP/room air showed desaturation for 1 hour 45 minutes started on nocturnal oxygen>>     Latest Ref Rng & Units 02/08/2022    2:30 PM 06/11/2021    3:05 PM 04/04/2021    3:23 AM  CBC  WBC 4.0 - 10.5 K/uL 20.4  8.5  15.4   Hemoglobin 12.0 - 15.0 g/dL 12.7  12.0  11.6   Hematocrit 36.0 - 46.0 % 38.3  36.0  36.1   Platelets 150 - 400  K/uL 266  261.0  279        Latest Ref Rng & Units 01/29/2022    3:26 PM 06/11/2021    3:05 PM 04/05/2021    4:02 AM  BMP  Glucose 70 - 99 mg/dL 128  197  343   BUN 6 - 23 mg/dL 19  9  24   $ Creatinine 0.40 - 1.20 mg/dL 1.04  1.04  0.95   Sodium 135 - 145 mEq/L 135  137  133   Potassium 3.5 - 5.1 mEq/L 3.4  4.0  4.9   Chloride 96 - 112 mEq/L 94  98  94   CO2 19 - 32 mEq/L 31  30  29   $ Calcium 8.4 - 10.5 mg/dL 9.6  9.2  9.4     BNP    Component Value Date/Time   BNP 55.2 03/13/2021 2251    ProBNP No results found for: "PROBNP"  PFT    Component Value Date/Time   FEV1PRE 1.06 03/17/2019 1444   FEV1POST 1.24 03/17/2019 1444   FVCPRE 2.35 03/17/2019 1444   FVCPOST 2.66 03/17/2019 1444   TLC 5.72 03/17/2019 1444   DLCOUNC 9.80 03/17/2019 1444   PREFEV1FVCRT 45 03/17/2019 1444   PSTFEV1FVCRT 47  03/17/2019 1444    DG Cervical Spine 2 or 3 views  Result Date: 02/14/2022 CLINICAL DATA:  Surgery, elective, ACDF C3-C4 EXAM: CERVICAL SPINE - 2-3 VIEW COMPARISON:  MRI cervical spine 11/13/2021 FINDINGS: Intraoperative images during cervical spine ACDF at C3-C4. Intact hardware without evidence of immediate complication. IMPRESSION: Intraoperative images during C3-C4 ACDF. No evidence of immediate complication. Electronically Signed   By: Maurine Simmering M.D.   On: 02/14/2022 10:17   DG C-Arm 1-60 Min-No Report  Result Date: 02/14/2022 Fluoroscopy was utilized by the requesting physician.  No radiographic interpretation.   DG C-Arm 1-60 Min-No Report  Result Date: 02/14/2022 Fluoroscopy was utilized by the requesting physician.  No radiographic interpretation.     Past medical hx Past Medical History:  Diagnosis Date   Acute respiratory failure with hypoxia ([place])    Alcoholism and drug addiction in family    Anxiety    Appendicitis    Arthritis    "knees, hips, lower back" (09/25/2016)   Asthma    Bipolar disorder ([place])    Chronic bronchitis ([place])    "all the time"  (09/25/2016)   Chronic leg pain    RLE   Chronic lower back pain    COPD (chronic obstructive pulmonary disease) ([place])    Depression    Diabetes mellitus without complication (HCC)    Diabetic neuropathy ([place])    Early cataracts, bilateral    Emphysema of lung ([place])    Generalized anxiety disorder 02/09/2020   GERD (gastroesophageal reflux disease)    Headache    Hepatitis C    "treated back in 2001-2002"   High cholesterol    Hypertension    Hypothyroidism    Incarcerated ventral hernia 04/04/2017   Mood disorder in conditions classified elsewhere 02/09/2020   On home oxygen therapy    "2L; at nighttime" (04/04/2017)   OSA on CPAP    CPAP with Oxygen 3 liters   Paranoid ([place])    Pneumonia    "all the time" (09/25/2016)   Positive PPD    with negative chest x ray   PTSD (post-traumatic stress disorder) 02/09/2020   Tachycardia    patient reports with exertion ; denies any other conditions    Thyroid disease    Wears glasses    Wears partial dentures      Social History   Tobacco Use   Smoking status: Every Day    Packs/day: 1.00    Years: 44.00    Total pack years: 44.00    Types: Cigarettes    Passive exposure: Current   Smokeless tobacco: Never   Tobacco comments:    1 cig per day 01/29/22  Vaping Use   Vaping Use: Never used  Substance Use Topics   Alcohol use: Not Currently   Drug use: Not Currently    Types: "Crack" cocaine    Comment:  "nothing since 01/12/2012"    [name] reports that she has been smoking cigarettes. She has a 44.00 pack-year smoking history. She has been exposed to tobacco smoke. She has never used smokeless tobacco. She reports that she does not currently use alcohol. She reports that she does not currently use drugs after having used the following drugs: "Crack" cocaine.  Tobacco Cessation: Ready to quit: Not Answered Counseling given: Not Answered Tobacco comments: 1 cig per day 01/29/22   Past surgical hx, Family hx, Social hx  all reviewed.  Current Outpatient Medications on File Prior to Visit  Medication Sig   Accu-Chek  Softclix Lancets lancets Use as instructed   acyclovir (ZOVIRAX) 400 MG tablet TAKE 1 TABLET BY MOUTH  TWICE DAILY (Patient taking differently: Take 400 mg by mouth in the morning.)   albuterol (PROVENTIL) (2.5 MG/3ML) 0.083% nebulizer solution Take 3 mLs (2.5 mg total) by nebulization 2 (two) times daily. And every 6 hours as needed.  DX: J44.9   atenolol (TENORMIN) 25 MG tablet TAKE 1 TABLET BY MOUTH  DAILY   atorvastatin (LIPITOR) 20 MG tablet TAKE 1 TABLET BY MOUTH ONCE  DAILY   blood glucose meter kit and supplies Dispense based on patient and insurance preference. Use up to two times daily as directed. (FOR ICD-10 E10.9, E11.9).   Budeson-Glycopyrrol-Formoterol (BREZTRI AEROSPHERE) 160-9-4.8 MCG/ACT AERO Inhale 2 puffs into the lungs in the morning and at bedtime.   buPROPion (WELLBUTRIN XL) 300 MG 24 hr tablet Take 1 tablet (300 mg total) by mouth daily.   busPIRone (BUSPAR) 5 MG tablet TAKE 1/2 TO 1 TABLET BY MOUTH  TWICE DAILY AS NEEDED (Patient taking differently: Take 5 mg by mouth at bedtime.)   Camphor-Menthol-Methyl Sal (SALONPAS) 3.03-02-08 % PTCH Place 1 patch onto the skin daily as needed (neck pain).   cetirizine (ZYRTEC) 10 MG tablet Take 1 tablet (10 mg total) by mouth 2 (two) times daily. (Patient taking differently: Take 10 mg by mouth daily.)   cyclobenzaprine (FLEXERIL) 10 MG tablet TAKE 1 TABLET BY MOUTH AT  BEDTIME   diazepam (VALIUM) 5 MG tablet Take 1 tablet (5 mg total) by mouth every 8 (eight) hours as needed for anxiety.   diphenhydrAMINE (BENADRYL) 25 mg capsule Take 25 mg by mouth 2 (two) times daily.   fluticasone (FLONASE) 50 MCG/ACT nasal spray Place 2 sprays into both nostrils daily. (Patient taking differently: Place 2 sprays into both nostrils daily as needed for allergies.)   glucose blood test strip Use as instructed   hydrochlorothiazide (HYDRODIURIL) 25 MG  tablet TAKE 1 TABLET BY MOUTH DAILY   Ipratropium-Albuterol (COMBIVENT RESPIMAT) 20-100 MCG/ACT AERS respimat Inhale 1 puff into the lungs every 6 (six) hours as needed for wheezing or shortness of breath.   levothyroxine (SYNTHROID) 25 MCG tablet TAKE 1 TABLET BY MOUTH DAILY  BEFORE BREAKFAST   MELATONIN PO Take 1 tablet by mouth at bedtime as needed (sleep).   montelukast (SINGULAIR) 10 MG tablet TAKE 1 TABLET BY MOUTH AT  BEDTIME   nystatin-triamcinolone ointment (MYCOLOG) Apply 1 application topically 2 (two) times daily as needed (applied to skin folds/groins/under breast skin irritation rash.).   oxyCODONE-acetaminophen (PERCOCET) 5-325 MG tablet Take 1-2 tablets by mouth every 6 (six) hours as needed for severe pain.   OXYGEN Inhale 3 L/min into the lungs See admin instructions. Inhale  3 L/min of oxygen into the lungs w/CPAP at bedtime and as needed for shortness of breath with "strenuous activity" during the day   pantoprazole (PROTONIX) 40 MG tablet TAKE 1 TABLET BY MOUTH DAILY   PATADAY 0.2 % SOLN Place 1 drop into both eyes daily.   polyethylene glycol (MIRALAX / GLYCOLAX) 17 g packet Take 17 g by mouth daily as needed for mild constipation.   pregabalin (LYRICA) 300 MG capsule Take 1 capsule (300 mg total) by mouth 2 (two) times daily.   QUEtiapine (SEROQUEL) 50 MG tablet TAKE 1 TABLET BY MOUTH AT  BEDTIME   roflumilast (DALIRESP) 500 MCG TABS tablet Take 1 tablet (500 mcg total) by mouth daily.   traZODone (DESYREL) 100 MG tablet TAKE 1 TABLET  BY MOUTH AT  BEDTIME   venlafaxine XR (EFFEXOR-XR) 150 MG 24 hr capsule TAKE 1 CAPSULE BY MOUTH DAILY  WITH BREAKFAST   witch hazel-glycerin (TUCKS) pad Apply topically as needed for itching.   No current facility-administered medications on file prior to visit.     Allergies  Allergen Reactions   Keflex [Cephalexin] Rash and Other (See Comments)    Has tolerated amoxicillin since had keflex   Shellfish Allergy Shortness Of Breath, Nausea  And Vomiting and Other (See Comments)    Stomach cramps   Tetracyclines & Related Anaphylaxis, Swelling and Other (See Comments)    Throat swelling requiring hospitalization   Dilaudid [Hydromorphone Hcl] Other (See Comments)    "Lethargy"   Prednisone Other (See Comments)    "counter reacts", has tolerated IV medrol   Clindamycin/Lincomycin Other (See Comments)    Burning sensation in her mouth down her throat    Incruse Ellipta [Umeclidinium Bromide] Nausea Only   Molds & Smuts Itching   Tuberculin Tests Other (See Comments)    False postive    Review Of Systems:  Constitutional:   No  weight loss, night sweats,  Fevers, chills, fatigue, or  lassitude.  HEENT:   No headaches,  Difficulty swallowing,  Tooth/dental problems, or  Sore throat,                No sneezing, itching, ear ache, nasal congestion, post nasal drip,   CV:  No chest pain,  Orthopnea, PND, swelling in lower extremities, anasarca, dizziness, palpitations, syncope.   GI  No heartburn, indigestion, abdominal pain, nausea, vomiting, diarrhea, change in bowel habits, loss of appetite, bloody stools.   Resp: No shortness of breath with exertion or at rest.  No excess mucus, no productive cough,  No non-productive cough,  No coughing up of blood.  No change in color of mucus.  No wheezing.  No chest wall deformity  Skin: no rash or lesions.  GU: no dysuria, change in color of urine, no urgency or frequency.  No flank pain, no hematuria   MS:  No joint pain or swelling.  No decreased range of motion.  No back pain.  Psych:  No change in mood or affect. No depression or anxiety.  No memory loss.   Vital Signs There were no vitals taken for this visit.   Physical Exam:  General- No distress,  A&Ox3 ENT: No sinus tenderness, TM clear, pale nasal mucosa, no oral exudate,no post nasal drip, no LAN Cardiac: S1, S2, regular rate and rhythm, no murmur Chest: No wheeze/ rales/ dullness; no accessory muscle use, no  nasal flaring, no sternal retractions Abd.: Soft Non-tender Ext: No clubbing cyanosis, edema Neuro:  normal strength Skin: No rashes, warm and dry Psych: normal mood and behavior   Assessment/Plan Severe COPD Chronic hypoxic respiratory failure Former smoker with a 44 pack year smoking history Daliresp  therapy OSA on CPAP Cervical surgery scheduled for 02/14/2022 by Dr. Dorcas Carrow. Marcello Moores, MD Plan Try AYR nasal saline for your nasal dryness.  Continue the Breztri 2 puffs twice daily Rinse mouth after use. Continue Combivent  as needed for shortness or wheezing. Continue Daliresp.  Continue oxygen at 3 L [place].  Saturation goal is > 88% at all times.  You have re qualified for your oxygen.  We will send this note into the DME.  Please work on quitting smoking.  Her insurance does not pay for Chantix.  Call 1-800-QUIT now. Continue CPAP as you have  been doing. You appear to be benefiting from the treatment >> Down Load looks great.  Goal is to wear for at least 6 hours each night for maximal clinical benefit. Continue to work on weight loss, as the link between excess weight  and sleep apnea is well established.   Remember to establish a good bedtime routine, and work on sleep hygiene.  Limit daytime naps , avoid stimulants such as caffeine and nicotine close to bedtime, exercise daily to promote sleep quality, avoid heavy , spicy, fried , or rich foods before bed. Ensure adequate exposure to natural light during the day,establish a relaxing bedtime routine with a pleasant sleep environment ( Bedroom between 60 and 67 degrees, turn off bright lights , TV or device screens screens , consider black out curtains or white noise machines) Do not drive if sleepy. Remember to clean mask, tubing, filter, and reservoir once weekly with soapy water.  Follow up with Dr. Elsworth Soho or Judson Roch NP  In 3 months or before as needed.  Good luck with your pending surgery Call us if you need anything from a  respiratory standpoint Saturations should be maintained > 92% at all times during sirgery Please contact office for sooner follow up if symptoms do not improve or worsen or seek emergency care    Recent elective C3-4 ACDF on 12/21 requiring 1 night stay post procedure. Plan Follow up with cardiology        [name], NP 03/12/2022  1:46 PM

## 2022-03-12 NOTE — Telephone Encounter (Signed)
I have no idea what she is asking about. I see she has been admitted to [facility] several times recently. Have they ordered home health for her? She would need to contact them to find out what agency and get in touch with them.

## 2022-03-12 NOTE — Telephone Encounter (Signed)
Left voicemail on patients phone to let her know that if she have any questions or concerns about her detailed message to give [place] a call back.

## 2022-03-12 NOTE — Telephone Encounter (Signed)
Patient has still not been contacted for her skilled nursing or her Physical therapy visits - please advise.  Patient's # [phone]

## 2022-03-13 ENCOUNTER — Inpatient Hospital Stay: Payer: Medicare Other | Admitting: Acute Care

## 2022-03-13 ENCOUNTER — Inpatient Hospital Stay: Payer: Medicare Other | Admitting: Internal Medicine

## 2022-03-14 DIAGNOSIS — J9611 Chronic respiratory failure with hypoxia: Secondary | ICD-10-CM | POA: Diagnosis not present

## 2022-03-14 DIAGNOSIS — J441 Chronic obstructive pulmonary disease with (acute) exacerbation: Secondary | ICD-10-CM | POA: Diagnosis not present

## 2022-03-14 DIAGNOSIS — G4733 Obstructive sleep apnea (adult) (pediatric): Secondary | ICD-10-CM | POA: Diagnosis not present

## 2022-03-14 DIAGNOSIS — J449 Chronic obstructive pulmonary disease, unspecified: Secondary | ICD-10-CM | POA: Diagnosis not present

## 2022-03-19 ENCOUNTER — Telehealth: Payer: Self-pay | Admitting: Acute Care

## 2022-03-19 DIAGNOSIS — R0600 Dyspnea, unspecified: Secondary | ICD-10-CM | POA: Diagnosis not present

## 2022-03-19 DIAGNOSIS — J449 Chronic obstructive pulmonary disease, unspecified: Secondary | ICD-10-CM | POA: Diagnosis not present

## 2022-03-19 DIAGNOSIS — Z9981 Dependence on supplemental oxygen: Secondary | ICD-10-CM | POA: Diagnosis not present

## 2022-03-19 DIAGNOSIS — R059 Cough, unspecified: Secondary | ICD-10-CM | POA: Diagnosis not present

## 2022-03-19 DIAGNOSIS — F17210 Nicotine dependence, cigarettes, uncomplicated: Secondary | ICD-10-CM | POA: Diagnosis not present

## 2022-03-19 DIAGNOSIS — Z7409 Other reduced mobility: Secondary | ICD-10-CM | POA: Diagnosis not present

## 2022-03-19 DIAGNOSIS — J9610 Chronic respiratory failure, unspecified whether with hypoxia or hypercapnia: Secondary | ICD-10-CM | POA: Diagnosis not present

## 2022-03-19 DIAGNOSIS — Z515 Encounter for palliative care: Secondary | ICD-10-CM | POA: Diagnosis not present

## 2022-03-19 NOTE — Telephone Encounter (Signed)
PT has two meds that need refilling.   Dilarest (She needs a generic called in)  Comnivent (Out of it) This comes from another company, she said. You will Know.  I looked on my med list and these were not on there for correct spelling. PT hard to understand. So sorry  [phone]

## 2022-03-20 MED ORDER — COMBIVENT RESPIMAT 20-100 MCG/ACT IN AERS: 1.0000 | INHALATION_SPRAY | Freq: Four times a day (QID) | RESPIRATORY_TRACT | 11 refills | Status: DC | PRN

## 2022-03-20 NOTE — Telephone Encounter (Signed)
There is no other patient assistance available that I can find since [employer] has d/c their program, there is a co-pay card on their website however there are limitations as of "Patients who are enrolled in a state or federally funded prescription insurance program are not eligible for this offer. This includes patients enrolled in Medicare Part D, Medicaid, Medigap, [place] ([place]), Department of [employer] ([place]) programs or [employer], and patients who are Medicare eligible and enrolled in an employer-sponsored group waiver health plan or government-subsidized prescription drug benefit program for retirees"

## 2022-03-20 NOTE — Telephone Encounter (Signed)
Called and spoke to patient and she states that she got a call from [employer] that they are no longer covering the Daliresp medication and she is wondering what she needs to do now.   I sent in her refills on her Combivent Respimat into [facility] as she requested while on the phone.   Please advise Judson Roch

## 2022-03-20 NOTE — Telephone Encounter (Signed)
Hey ladies,  Do we happen to know any financial assistance for Daliresp?  Thank you

## 2022-03-21 ENCOUNTER — Ambulatory Visit (INDEPENDENT_AMBULATORY_CARE_PROVIDER_SITE_OTHER): Payer: Medicare Other | Admitting: Licensed Clinical Social Worker

## 2022-03-21 DIAGNOSIS — F411 Generalized anxiety disorder: Secondary | ICD-10-CM

## 2022-03-21 DIAGNOSIS — F4310 Post-traumatic stress disorder, unspecified: Secondary | ICD-10-CM

## 2022-03-21 DIAGNOSIS — F0630 Mood disorder due to known physiological condition, unspecified: Secondary | ICD-10-CM | POA: Diagnosis not present

## 2022-03-21 DIAGNOSIS — R443 Hallucinations, unspecified: Secondary | ICD-10-CM | POA: Diagnosis not present

## 2022-03-21 NOTE — Progress Notes (Signed)
Virtual Visit via Video Note  I connected with [name] on 03/21/22 at  2:00 PM EST by a video enabled telemedicine application and verified that I am speaking with the correct person using two identifiers.  Location: Patient: home Provider: home office   I discussed the limitations of evaluation and management by telemedicine and the availability of in person appointments. The patient expressed understanding and agreed to proceed.   I discussed the assessment and treatment plan with the patient. The patient was provided an opportunity to ask questions and all were answered. The patient agreed with the plan and demonstrated an understanding of the instructions.   The patient was advised to call back or seek an in-person evaluation if the symptoms worsen or if the condition fails to improve as anticipated.  I provided 53 minutes of non-face-to-face time during this encounter.   THERAPIST PROGRESS NOTE  Session Time: 2:00 PM to 2:53 PM  Participation Level: Active  Behavioral Response: CasualAlertEuthymic  Type of Therapy: Individual Therapy  Treatment Goals addressed: Work on trauma, anxiety, depression, coping  ProgressTowards Goals: Progressing-noted good progress for patient over the past few months as she has made life changes utilized session to talk about life stressors processing thoughts and feelings noting some positive aspects of life  Interventions: Solution Focused, Strength-based, Supportive, and Other: coping  Summary: Shuntel Fishburn is a 62 y.o. female who presents with doing pretty good trying to find somebody to come in and clean hard. Nurse comes 4-6 weeks patient has a serious illness with lungs reason and therapist actively sees that as patient updates her and our interactions at sessions.. In and out of hospital since December 1. Sister said would help but think she is giving excuses. Talking to a friends at church about needing somebody to come to  estimate how much to paint the white trim. Have her son do it needs money needs to go to work. He said yes and supposed to come by and give an estimate but he didn't. Also said looking for house cleaner asked daughter Elmo Putt and said yes. They said try to reach her but couldn't. Asked Glenard Haring who is the mother of the church if could put a sign up somewhere.  Would like to contribute to church but has no money and she explains giving examples when has extra money buy an extra pair of pants. Cupboards empty not much silverware, food gets food truck so good with that. Truck comes through and helps with food.  Not as much pain as was. Sore at night don't wear collar her fault. Feels so confined in it.  Therapist asking her about what is going on in her life she relates lives minute by minute day by day. Started going back to church after surgery. Wasn't going to go out in the cold. Talked about it helping socially tries to say hi to people can't get around the church. Especially lately doesn't have oxygen. Gave her a little one didn't work. New one can't charge either can't get battery out. Won't change them again stay with a good one. Couldn't wear it with neck brought a new one same thing. Needs to go to gym and build herself up. Struggling with memory a couple of times during session got confused about conversation. Will start by walking in front of the house to help with stamina and energy would like to do before gym good for lungs too. Needs to get energy not sure if get something depressed  or what. Get out in sun for at least Vitamin D..Was on hepatitis treatment and friend got herself out really helped her feel better. Therapist pointed out out now and that is improves mood for session. Tomorrow all night prayer Saturday or next Saturday baby shower for couple like to go and bring a gift. Patient an anchor at church so Sunday after church they will have food after church fellowship hang about with anchors.Going  to make American chop suey made with pasta and hamburger, stewed tomato. Anchor are people patient's age.  Looking into a [employer] fast streamer. Lost weight 215 and 193 lbs feels good about that. Not so stressed with brother stress makes her gain. Where living caused stress. Feels better in comparison not dealing with stress even if no energy don't have to deal with him on top of that.  Reviewed session work on exercise, getting outside.  Therapist noted the positive of charge and patient says goes to church regularly.  This also identified connection and sessions helpful that even though gets to church patient is a little isolated may contribute to lower energy therapist thinks connection through sessions helps as well with mental health.      Assess therapeutic intervention for patient to connect with this therapist focusing on her strengths processing thoughts and feelings related to daily life stressors, medical stressors, and additionally supportive interventions helpful for patient who is connected to church but still some isolation.  Therapist emphasized the positive church provides a social place as well as spirituality patient goes regularly.  Talked about adding to her schedule to get outside and walk the benefits of this just getting outside and then if she builds up strength and energy she can get to the gym.  We contrasted how things are now as opposed to a few months ago noted a lot of improvement in patient's life that helps with her mental health. Suicidal/Homicidal: No  Plan: Return again in 2 weeks.2.Start trauma work, process feelings related to stressors  Diagnosis: PTSD, hallucinations, GAD, Mood Disorder in conditions classified elsewhere  Collaboration of Care: Other none needed  Patient/Guardian was advised Release of Information must be obtained prior to any record release in order to collaborate their care with an outside provider. Patient/Guardian was advised if they have not  already done so to contact the registration department to sign all necessary forms in order for [place] to release information regarding their care.   Consent: Patient/Guardian gives verbal consent for treatment and assignment of benefits for services provided during this visit. Patient/Guardian expressed understanding and agreed to proceed.   [name], LCSW 03/21/2022

## 2022-03-25 ENCOUNTER — Encounter (HOSPITAL_COMMUNITY): Payer: Self-pay | Admitting: Psychiatry

## 2022-03-25 ENCOUNTER — Telehealth (INDEPENDENT_AMBULATORY_CARE_PROVIDER_SITE_OTHER): Payer: Medicare Other | Admitting: Psychiatry

## 2022-03-25 DIAGNOSIS — F0630 Mood disorder due to known physiological condition, unspecified: Secondary | ICD-10-CM | POA: Diagnosis not present

## 2022-03-25 DIAGNOSIS — F411 Generalized anxiety disorder: Secondary | ICD-10-CM

## 2022-03-25 DIAGNOSIS — F4310 Post-traumatic stress disorder, unspecified: Secondary | ICD-10-CM | POA: Diagnosis not present

## 2022-03-25 MED ORDER — BUPROPION HCL ER (XL) 300 MG PO TB24: 300.0000 mg | ORAL_TABLET | Freq: Every day | ORAL | 0 refills | Status: DC

## 2022-03-25 MED ORDER — QUETIAPINE FUMARATE 50 MG PO TABS: 50.0000 mg | ORAL_TABLET | Freq: Every day | ORAL | 2 refills | Status: DC

## 2022-03-25 MED ORDER — BUSPIRONE HCL 5 MG PO TABS: 5.0000 mg | ORAL_TABLET | Freq: Every day | ORAL | 0 refills | Status: DC

## 2022-03-25 NOTE — Progress Notes (Signed)
[place] Follow up visit  Patient Identification: Joan [name] MRN:  [id number] Date of Evaluation:  03/25/2022 Referral Source:Therapist [name] Chief Complaint:    depression follow up  Visit Diagnosis:    ICD-10-CM   1. Mood disorder in conditions classified elsewhere  F06.30     2. Generalized anxiety disorder  F41.1     3. PTSD (post-traumatic stress disorder)  F43.10      Virtual Visit via Video Note  I connected with [name] on 03/25/22 at  3:00 PM EST by a video enabled telemedicine application and verified that I am speaking with the correct person using two identifiers.  Location: Patient: home Provider: home office   I discussed the limitations of evaluation and management by telemedicine and the availability of in person appointments. The patient expressed understanding and agreed to proceed.      I discussed the assessment and treatment plan with the patient. The patient was provided an opportunity to ask questions and all were answered. The patient agreed with the plan and demonstrated an understanding of the instructions.   The patient was advised to call back or seek an in-person evaluation if the symptoms worsen or if the condition fails to improve as anticipated.  I provided 15 minutes of non-face-to-face time during this encounter.     History of Present Illness: Patient is a 62 years old currently widow Caucasian female initially referred by her therapist for management of mood symptoms, depression possible PTSD and anxiety  Patient has been in rehab after being admitted for COPD exacerbation Now moved out from brothers house to her own apartment, likes it Doing fair, tolerating meds, going to church that helps  Gets lonely but tries to engage with friend or church  Trying to work on her smoking and copd   Aggravating factor; difficult childhood, grief, difficult relationship with middle kid, hip replacement medical comorbidity,   lonliness Modifying factor: church, brother  Duration adult life      Past Psychiatric History: depression, drug use  Previous Psychotropic Medications: Yes   Substance Abuse History in the last 12 months:  No.  Consequences of Substance Abuse: history of extensive drug use uptil 8 years ago and have been in rehab programs  Past Medical History:  Past Medical History:  Diagnosis Date   Acute respiratory failure with hypoxia ([place])    Alcoholism and drug addiction in family    Anxiety    Appendicitis    Arthritis    "knees, hips, lower back" (09/25/2016)   Asthma    Bipolar disorder ([place])    Chronic bronchitis ([place])    "all the time" (09/25/2016)   Chronic leg pain    RLE   Chronic lower back pain    COPD (chronic obstructive pulmonary disease) ([place])    Depression    Diabetes mellitus without complication ([place])    Diabetic neuropathy ([place])    Early cataracts, bilateral    Emphysema of lung ([place])    Generalized anxiety disorder 02/09/2020   GERD (gastroesophageal reflux disease)    Headache    Hepatitis C    "treated back in 2001-2002"   High cholesterol    Hypertension    Hypothyroidism    Incarcerated ventral hernia 04/04/2017   Mood disorder in conditions classified elsewhere 02/09/2020   On home oxygen therapy    "2L; at nighttime" (04/04/2017)   OSA on CPAP    CPAP with Oxygen 3 liters   Paranoid ([place])  Pneumonia    "all the time" (09/25/2016)   Positive PPD    with negative chest x ray   PTSD (post-traumatic stress disorder) 02/09/2020   Tachycardia    patient reports with exertion ; denies any other conditions    Thyroid disease    Wears glasses    Wears partial dentures     Past Surgical History:  Procedure Laterality Date   ANTERIOR CERVICAL DECOMP/DISCECTOMY FUSION N/A 02/14/2022   Procedure: Anterior Cervical Decompression/Discectomy Fusion Cervical Three-Cervical Four;  Surgeon: Vallarie Mare, MD;  Location: [facility];  Service: Neurosurgery;   Laterality: N/A;  3C   APPENDECTOMY     BACK SURGERY     BIOPSY  11/24/2018   Procedure: BIOPSY;  Surgeon: Thornton Park, MD;  [facility];  Service: Gastroenterology;;   CARDIAC CATHETERIZATION     COLONOSCOPY     COLONOSCOPY WITH PROPOFOL N/A 11/24/2018   Procedure: COLONOSCOPY WITH PROPOFOL;  Surgeon: Thornton Park, MD;  [facility];  Service: Gastroenterology;  Laterality: N/A;   CYST EXCISION     removal of cyst in sinuses   DILATION AND CURETTAGE OF UTERUS     ESOPHAGOGASTRODUODENOSCOPY (EGD) WITH PROPOFOL N/A 11/24/2018   Procedure: ESOPHAGOGASTRODUODENOSCOPY (EGD) WITH PROPOFOL;  Surgeon: Thornton Park, MD;  [facility];  Service: Gastroenterology;  Laterality: N/A;   FOOT SURGERY Bilateral    bone removed from 4th and 5 th toes and put in pin then took pin out   [place]  ~ 2014/2015   "removed mesh & infection"   [place] N/A 04/04/2017   Procedure: LAPAROSCOPIC INCISIONAL HERNIA REPAIR WITH MESH;  Surgeon: Fanny Skates, MD;  Location: [facility];  Service: General;  Laterality: N/A;   LACERATION REPAIR Right    repair wrist artery from laceration from ice skate   LAPAROSCOPIC APPENDECTOMY N/A 03/31/2019   Procedure: APPENDECTOMY LAPAROSCOPIC;  Surgeon: Ralene Ok, MD;  [facility];  Service: General;  Laterality: N/A;   LAPAROSCOPIC APPENDECTOMY N/A 07/09/2019   Procedure: APPENDECTOMY LAPAROSCOPIC;  Surgeon: Ralene Ok, MD;  Location: [facility];  Service: General;  Laterality: N/A;   LAPAROSCOPIC INCISIONAL / UMBILICAL / [place]  04/04/2017   LAPAROSCOPIC INCISIONAL HERNIA REPAIR WITH MESH   LIPOMA EXCISION Right    fattty lipoma in neck   NASAL SEPTUM SURGERY     POSTERIOR LUMBAR FUSION  2015/2016 X 2   "added rods and screws"   SINOSCOPY     TOTAL HIP ARTHROPLASTY Right 02/05/2017   Procedure: RIGHT TOTAL HIP ARTHROPLASTY;  Surgeon: [name],  MD;  [facility];  Service: Orthopedics;  Laterality: Right;   TOTAL HIP ARTHROPLASTY Left 06/18/2017   Procedure: LEFT TOTAL HIP ARTHROPLASTY;  Surgeon: [name], MD;  [facility];  Service: Orthopedics;  Laterality: Left;   TOTAL HIP REVISION Left 01/26/2019   Procedure: TOTAL HIP REVISION;  Surgeon: Paralee Cancel, MD;  [facility];  Service: Orthopedics;  Laterality: Left;  2 hrs   TUBAL LIGATION     UMBILICAL HERNIA REPAIR  ~ 2013   w/mesh   UPPER GI ENDOSCOPY      Family Psychiatric History: FAther : alcohol use  Family History:  Family History  Problem Relation Age of Onset   Diabetes Mother    Hypertension Mother    Hypertension Father    Cancer Father        lung   Breast cancer Neg Hx  Social History:   Social History   Socioeconomic History   Marital status: Widowed    Spouse name: Not on file   Number of children: 3   Years of education: Not on file   Highest education level: Not on file  Occupational History   Not on file  Tobacco Use   Smoking status: Every Day    Packs/day: 1.00    Years: 44.00    Total pack years: 44.00    Types: Cigarettes    Passive exposure: Current   Smokeless tobacco: Never   Tobacco comments:    1 cig per day 01/29/22  Vaping Use   Vaping Use: Never used  Substance and Sexual Activity   Alcohol use: Not Currently   Drug use: Not Currently    Types: "Crack" cocaine    Comment:  "nothing since 01/12/2012"   Sexual activity: Not Currently    Partners: Male  Other Topics Concern   Not on file  Social History Narrative   Not on file   Social Determinants of Health   Financial Resource Strain: High Risk (05/05/2020)   Overall Financial Resource Strain (CARDIA)    Difficulty of Paying Living Expenses: Hard  Food Insecurity: No Food Insecurity (11/15/2021)   Hunger Vital Sign    Worried About Running Out of Food in the Last Year: Never true    [place] in the Last Year: Never true  Recent  Concern: Food Insecurity - Food Insecurity Present (08/27/2021)   Hunger Vital Sign    Worried About Running Out of Food in the Last Year: Sometimes true    Ran Out of Food in the Last Year: Never true  Transportation Needs: No Transportation Needs (11/15/2021)   PRAPARE - Hydrologist (Medical): No    Lack of Transportation (Non-Medical): No  Physical Activity: Inactive (08/27/2021)   Exercise Vital Sign    Days of Exercise per Week: 0 days    Minutes of Exercise per Session: 0 min  Stress: Stress Concern Present (12/26/2020)   [place]    Feeling of Stress : Very much  Social Connections: Socially Isolated (08/27/2021)   Social Connection and Isolation Panel [NHANES]    Frequency of Communication with Friends and Family: More than three times a week    Frequency of Social Gatherings with Friends and Family: Never    Attends Religious Services: Never    Marine scientist or Organizations: No    Attends Archivist Meetings: Never    Marital Status: Widowed      Allergies:   Allergies  Allergen Reactions   Keflex [Cephalexin] Rash and Other (See Comments)    Has tolerated amoxicillin since had keflex   Shellfish Allergy Shortness Of Breath, Nausea And Vomiting and Other (See Comments)    Stomach cramps   Tetracyclines & Related Anaphylaxis, Swelling and Other (See Comments)    Throat swelling requiring hospitalization   Dilaudid [Hydromorphone Hcl] Other (See Comments)    "Lethargy"   Prednisone Other (See Comments)    "counter reacts", has tolerated IV medrol   Clindamycin/Lincomycin Other (See Comments)    Burning sensation in her mouth down her throat    Incruse Ellipta [Umeclidinium Bromide] Nausea Only   Molds & Smuts Itching   Tuberculin Tests Other (See Comments)    False postive    Metabolic Disorder Labs: Lab Results  Component Value Date  HGBA1C 7.0 (H)  02/08/2022   MPG 154 02/08/2022   MPG 148.46 03/14/2021   No results found for: "PROLACTIN" Lab Results  Component Value Date   CHOL 140 06/11/2021   TRIG 98.0 06/11/2021   HDL 51.10 06/11/2021   CHOLHDL 3 06/11/2021   VLDL 19.6 06/11/2021   LDLCALC 69 06/11/2021   LDLCALC 58 03/07/2020   Lab Results  Component Value Date   TSH 0.93 06/11/2021    Therapeutic Level Labs: No results found for: "LITHIUM" No results found for: "CBMZ" No results found for: "VALPROATE"  Current Medications: Current Outpatient Medications  Medication Sig Dispense Refill   Accu-Chek Softclix Lancets lancets Use as instructed 100 each 12   acyclovir (ZOVIRAX) 400 MG tablet TAKE 1 TABLET BY MOUTH  TWICE DAILY (Patient taking differently: Take 400 mg by mouth in the morning.) 180 tablet 3   albuterol (PROVENTIL) (2.5 MG/3ML) 0.083% nebulizer solution Take 3 mLs (2.5 mg total) by nebulization 2 (two) times daily. And every 6 hours as needed.  DX: J44.9 360 mL 3   atenolol (TENORMIN) 25 MG tablet TAKE 1 TABLET BY MOUTH  DAILY 90 tablet 3   atorvastatin (LIPITOR) 20 MG tablet TAKE 1 TABLET BY MOUTH ONCE  DAILY 100 tablet 2   blood glucose meter kit and supplies Dispense based on patient and insurance preference. Use up to two times daily as directed. (FOR ICD-10 E10.9, E11.9). 1 each 0   Budeson-Glycopyrrol-Formoterol (BREZTRI AEROSPHERE) 160-9-4.8 MCG/ACT AERO Inhale 2 puffs into the lungs in the morning and at bedtime. 10.7 g 0   buPROPion (WELLBUTRIN XL) 300 MG 24 hr tablet Take 1 tablet (300 mg total) by mouth daily. 90 tablet 0   busPIRone (BUSPAR) 5 MG tablet Take 1 tablet (5 mg total) by mouth at bedtime. 30 tablet 0   Camphor-Menthol-Methyl Sal (SALONPAS) 3.03-02-08 % PTCH Place 1 patch onto the skin daily as needed (neck pain).     cetirizine (ZYRTEC) 10 MG tablet Take 1 tablet (10 mg total) by mouth 2 (two) times daily. (Patient taking differently: Take 10 mg by mouth daily.) 180 tablet 3    cyclobenzaprine (FLEXERIL) 10 MG tablet TAKE 1 TABLET BY MOUTH AT  BEDTIME 90 tablet 1   diazepam (VALIUM) 5 MG tablet Take 1 tablet (5 mg total) by mouth every 8 (eight) hours as needed for anxiety. 21 tablet 0   diphenhydrAMINE (BENADRYL) 25 mg capsule Take 25 mg by mouth 2 (two) times daily.     fluticasone (FLONASE) 50 MCG/ACT nasal spray Place 2 sprays into both nostrils daily. (Patient taking differently: Place 2 sprays into both nostrils daily as needed for allergies.) 48 g 1   glucose blood test strip Use as instructed 100 each 12   hydrochlorothiazide (HYDRODIURIL) 25 MG tablet TAKE 1 TABLET BY MOUTH DAILY 100 tablet 2   Ipratropium-Albuterol (COMBIVENT RESPIMAT) 20-100 MCG/ACT AERS respimat Inhale 1 puff into the lungs every 6 (six) hours as needed for wheezing or shortness of breath. 4 g 11   levothyroxine (SYNTHROID) 25 MCG tablet TAKE 1 TABLET BY MOUTH DAILY  BEFORE BREAKFAST 100 tablet 2   MELATONIN PO Take 1 tablet by mouth at bedtime as needed (sleep).     montelukast (SINGULAIR) 10 MG tablet TAKE 1 TABLET BY MOUTH AT  BEDTIME 100 tablet 2   nystatin-triamcinolone ointment (MYCOLOG) Apply 1 application topically 2 (two) times daily as needed (applied to skin folds/groins/under breast skin irritation rash.). 100 g 3   oxyCODONE-acetaminophen (PERCOCET)  5-325 MG tablet Take 1-2 tablets by mouth every 6 (six) hours as needed for severe pain. 50 tablet 0   OXYGEN Inhale 3 L/min into the lungs See admin instructions. Inhale  3 L/min of oxygen into the lungs w/CPAP at bedtime and as needed for shortness of breath with "strenuous activity" during the day     pantoprazole (PROTONIX) 40 MG tablet TAKE 1 TABLET BY MOUTH DAILY 100 tablet 3   PATADAY 0.2 % SOLN Place 1 drop into both eyes daily.     polyethylene glycol (MIRALAX / GLYCOLAX) 17 g packet Take 17 g by mouth daily as needed for mild constipation. 28 packet 0   pregabalin (LYRICA) 300 MG capsule Take 1 capsule (300 mg total) by mouth 2  (two) times daily. 60 capsule 3   QUEtiapine (SEROQUEL) 50 MG tablet Take 1 tablet (50 mg total) by mouth at bedtime. 30 tablet 2   roflumilast (DALIRESP) 500 MCG TABS tablet Take 1 tablet (500 mcg total) by mouth daily. 30 tablet 11   traZODone (DESYREL) 100 MG tablet TAKE 1 TABLET BY MOUTH AT  BEDTIME 100 tablet 3   venlafaxine XR (EFFEXOR-XR) 150 MG 24 hr capsule TAKE 1 CAPSULE BY MOUTH DAILY  WITH BREAKFAST 100 capsule 3   witch hazel-glycerin (TUCKS) pad Apply topically as needed for itching.     No current facility-administered medications for this visit.     Psychiatric Specialty Exam: Review of Systems  Cardiovascular:  Negative for chest pain.  Psychiatric/Behavioral:  Negative for substance abuse and suicidal ideas.     There were no vitals taken for this visit.There is no height or weight on file to calculate BMI.  General Appearance:casual  Eye Contact: fair  Speech:  Slow  Volume:  Decreased  Mood: fair  Affect:  congruent  Thought Process:  Goal Directed  Orientation:  Full (Time, Place, and Person)  Thought Content: linear  Suicidal Thoughts:  No  Homicidal Thoughts:  No  Memory:  Immediate;   Fair Recent;   Fair  Judgement:  Fair  Insight:  Shallow  Psychomotor Activity:  Decreased  Concentration:  Concentration: Fair and Attention Span: Fair  Recall:  [employer] of Knowledge:Fair  Language: Fair  Akathisia:  No  Handed:    AIMS (if indicated): no involuntary movements  Assets:  Desire for Improvement Leisure Time Social Support  ADL's: limited due to recent surgery  Cognition: WNL  Sleep:   variable   Screenings: Mini-Mental    Flowsheet Row Clinical Support from 04/06/2018 in [facility]  Total Score (max 30 points ) 29      PHQ2-9    [place] Visit from 10/12/2021 in [facility] from 08/27/2021 in [facility] at [place]  Management from 05/04/2021 in [facility] at [facility] Visit from 03/27/2021 in [facility] at [facility] [place] Management from 01/31/2021 in [facility] at [facility]  PHQ-2 Total Score 2 0 1 0 0  PHQ-9 Total Score 2 -- -- 0 --      Flowsheet Row Admission (Discharged) from 02/14/2022 in [facility] from [facility]/08/2022 in [facility] PREADMISSION TESTING Video Visit from 06/08/2021 in [facility] at [place] No Risk No Risk No Risk  Assessment and Plan: as follows  Prior documentation reviewed    Mood disorder unspecified: relavant to multiple medical concerns and past history of using drugs, mood is better, handling depression, continue meds including effeoxr wellbutrin Likes her apartment  PTSD: baseline continue effexor and therapy  Insomnia: reviewed sleep hygiene, is on trazadone, seroquel, buspar  Understands to keep awake during the day Anxiety manageable continue effexor  Fu 66m Renewed meds due and some she gets from primary care  NMerian Capron MD 1/29/20243:05 PM

## 2022-03-29 ENCOUNTER — Encounter: Payer: Self-pay | Admitting: Internal Medicine

## 2022-03-29 ENCOUNTER — Ambulatory Visit (INDEPENDENT_AMBULATORY_CARE_PROVIDER_SITE_OTHER): Payer: Medicare Other | Admitting: Internal Medicine

## 2022-03-29 VITALS — BP 120/90 | HR 45 | Temp 97.8°F

## 2022-03-29 DIAGNOSIS — J9611 Chronic respiratory failure with hypoxia: Secondary | ICD-10-CM

## 2022-03-29 DIAGNOSIS — E1140 Type 2 diabetes mellitus with diabetic neuropathy, unspecified: Secondary | ICD-10-CM | POA: Diagnosis not present

## 2022-03-29 DIAGNOSIS — J449 Chronic obstructive pulmonary disease, unspecified: Secondary | ICD-10-CM

## 2022-03-29 DIAGNOSIS — J431 Panlobular emphysema: Secondary | ICD-10-CM

## 2022-03-29 MED ORDER — PREGABALIN 300 MG PO CAPS: 300.0000 mg | ORAL_CAPSULE | Freq: Two times a day (BID) | ORAL | 3 refills | Status: DC

## 2022-03-29 MED ORDER — NAPROXEN 500 MG PO TABS: 500.0000 mg | ORAL_TABLET | Freq: Two times a day (BID) | ORAL | 3 refills | Status: DC

## 2022-03-29 NOTE — Assessment & Plan Note (Signed)
Using 3 L of oxygen with portable unit. Does require this 24/7 and will need to stay on lifelong. Advised to quit smoking to help avoid progression of lung disease.

## 2022-03-29 NOTE — Progress Notes (Signed)
   Subjective:   Patient ID: Joan [name], female    DOB: 1960/08/20, 62 y.o.   MRN: [id number]  HPI The patient is a 62 YO female coming in for hospital follow up (admitted to [facility] for COPD exacerbation 02/22/22-02/28/22). She is overall doing okay but not feeling great. Headaches and blurred vision. Has gone back up to 1 PPD smoking. Cannot afford chantix. Some cough and SOB. No fevers or chills or body aches. Is asking about diazepam.  PMH, [place], social history reviewed and updated  Review of Systems  Constitutional:  Positive for activity change, appetite change and fatigue.  HENT:  Positive for congestion.   Eyes: Negative.   Respiratory:  Positive for cough, chest tightness, shortness of breath and wheezing.   Cardiovascular:  Negative for chest pain, palpitations and leg swelling.  Gastrointestinal:  Negative for abdominal distention, abdominal pain, constipation, diarrhea, nausea and vomiting.  Musculoskeletal:  Positive for arthralgias and neck pain.  Skin: Negative.   Neurological:  Positive for headaches.  Psychiatric/Behavioral: Negative.      Objective:  Physical Exam Constitutional:      Appearance: She is well-developed. She is ill-appearing.  HENT:     Head: Normocephalic and atraumatic.  Cardiovascular:     Rate and Rhythm: Normal rate and regular rhythm.  Pulmonary:     Effort: Pulmonary effort is normal. No respiratory distress.     Breath sounds: Wheezing and rhonchi present. No rales.     Comments: Speaking in full sentences, rhonchi on wheezing on exam stable to baseline Abdominal:     General: Bowel sounds are normal. There is no distension.     Palpations: Abdomen is soft.     Tenderness: There is no abdominal tenderness. There is no rebound.  Musculoskeletal:     Cervical back: Normal range of motion.  Skin:    General: Skin is warm and dry.  Neurological:     Mental Status: She is alert and oriented to person, place, and time.     Coordination:  Coordination normal.     Vitals:   03/29/22 1619  BP: (!) 120/90  Pulse: (!) 45  Temp: 97.8 F (36.6 C)  TempSrc: Oral  SpO2: 91%    Assessment & Plan:  Visit time 25 minutes in face to face communication with patient and coordination of care, additional 15 minutes spent in record review, coordination or care, ordering tests, communicating/referring to other healthcare professionals, documenting in medical records all on the same day of the visit for total time 40 minutes spent on the visit.

## 2022-03-29 NOTE — Assessment & Plan Note (Signed)
Needs refill on lyrica 300 mg BID. This is helping but per [place] database review she has not filled since October 2023 for 1 month supply. No significant change in neuropathy.

## 2022-03-29 NOTE — Patient Instructions (Addendum)
Talk to Dr. Nicole Cella about the diazepam as this may not be safe for you long term and he should decide. I do not think you should be on diazepam long term.

## 2022-03-29 NOTE — Assessment & Plan Note (Signed)
Using beztri and combivent and daliresp and is able to get these. May have cost issues with daliresp as patient assistance has ended. No flare today but is smoking more than usual at 1 PPD. Counseled to quit but she cannot afford chantix at this time.

## 2022-04-02 MED ORDER — COMBIVENT RESPIMAT 20-100 MCG/ACT IN AERS: 1.0000 | INHALATION_SPRAY | Freq: Four times a day (QID) | RESPIRATORY_TRACT | 3 refills | Status: DC | PRN

## 2022-04-02 MED ORDER — ROFLUMILAST 500 MCG PO TABS: 500.0000 ug | ORAL_TABLET | Freq: Every day | ORAL | 3 refills | Status: AC

## 2022-04-02 NOTE — Telephone Encounter (Signed)
Spoke with the pt and notified of response from pharm team  She verbalized understanding  She asked that we go ahead and try sending in rx to [facility]  Nothing further needed

## 2022-04-04 ENCOUNTER — Ambulatory Visit (INDEPENDENT_AMBULATORY_CARE_PROVIDER_SITE_OTHER): Payer: Medicare Other | Admitting: Licensed Clinical Social Worker

## 2022-04-04 DIAGNOSIS — F0630 Mood disorder due to known physiological condition, unspecified: Secondary | ICD-10-CM | POA: Diagnosis not present

## 2022-04-04 DIAGNOSIS — F4310 Post-traumatic stress disorder, unspecified: Secondary | ICD-10-CM | POA: Diagnosis not present

## 2022-04-04 DIAGNOSIS — F411 Generalized anxiety disorder: Secondary | ICD-10-CM

## 2022-04-04 DIAGNOSIS — R443 Hallucinations, unspecified: Secondary | ICD-10-CM

## 2022-04-04 NOTE — Progress Notes (Signed)
Virtual Visit via Video Note  I connected with [name] on 04/04/22 at  2:00 PM EST by a video enabled telemedicine application and verified that I am speaking with the correct person using two identifiers.  Location: Patient: outside Provider: home office   I discussed the limitations of evaluation and management by telemedicine and the availability of in person appointments. The patient expressed understanding and agreed to proceed.   I discussed the assessment and treatment plan with the patient. The patient was provided an opportunity to ask questions and all were answered. The patient agreed with the plan and demonstrated an understanding of the instructions.   The patient was advised to call back or seek an in-person evaluation if the symptoms worsen or if the condition fails to improve as anticipated.  I provided 53 minutes of non-face-to-face time during this encounter.  THERAPIST PROGRESS NOTE  Session Time: 2:00 PM to 2:53 PM  Participation Level: Active  Behavioral Response: CasualAlertEuthymic  Type of Therapy: Individual Therapy  Treatment Goals addressed: Work on trauma, anxiety, depression, coping  ProgressTowards Goals: Progressing-worked on trauma worked on the why things happened to help work on trauma, patient processing feelings related to addiction that cause problems in her life therapist did reframing to note how turning her life around major as well as were not defined by her mistakes, noted significant play spirituality place that is helped her a lot  Interventions: Solution Focused, Strength-based, Supportive, Reframing, and Other: trauma  Summary: Joan [name] is a 62 y.o. female who presents with back to church as much as possible. Class over and another starting in March. Wants to go to addiction class wants to use it for help with smoking. In a program already for smoking. Through hospice not in it. Also likes activities at church  because likes to get out as well. After conversation going to brother's house. Texted him. Ask him to take a dog has been abandoned. People who own never keep in house.took dog and went to the house they didn't answer. Went to police station they were closed. Has her and said if meant to keep her or find a good home. Don't want to give her up but have to $300 to keep her and can't afford that. Eddie Dibbles is the her only friend they talk outside. Did make a couple of female friends talk with neighbor other one lives near office say hi when checks her mail.  Why did that happen with [name]? Doesn't know. Patient said sure had a part to lash back at him but not going to take it. He had issues he is lonely, he is gay. He was addict at one point recovering addict and alcoholic father didn't bother with him much because of choices of life that must be hard on him. Counseling for years. He still deals with sponsor has a lot of issues. He was picked on and made fun of because of lifestyle and at home. He has been picked up through his life that he has so much hate he will never change does get along with people. Patient and him were close talk all the time wanted patient to live with him.  Patient says she has issues long before him. She was an addict, alcoholic "shitty mother" didn't raise her kids. First marriage at 62. Looks at picture of her and son. Baby raising a baby. Can't believe how young she was and partner was 62. He caused a lot of problems with kids  tell them things not true kids believe him over her and patient asks why wasn't in trouble he caused all the problems. [age] got married. He was into drugs. He was heroin addict beat first wife. Didn't use heroin with her. Told kids patient was whore and sleeping around not true. Patient was living with brother tooted the horn and it was him. Parking lot next to brother's. Smashed something daughter made for her. Became mean when she left him. He was mad left. Mom and  Dad and sister upset left. Left because grew up. [place] out of love not really in love with. Thought knew what love was. Knew when seeing this guy Alyce Pagan that up. Messed up things with parents, kids husbands. Because of drugs. Stop when [age] and relapsed a couple of times. Family waiting for a phone call. That damaged lungs and brain. Eats enzymes in brain didn't know it but know it now. Did it for a few years her choice did when with husband at beginning and then stopped a lot of praying one of the ways. For some reason picked it up again and he died and patient wasn't there. Bad history wish erase out of head and can't especially with kids would have been a good mom. Patient has an addictive personality. How addicted to things. Started with butter pecan chase it down went to five stores. Think did it because liked it take mind off of things.  Therapist use reframing to note how therapist felt and thought patient would as well how good it must feel to turn life around is inspiring a lot to do with god and his work in her life. Couldn't do this by herself.  Working on smoking helped her-having a hard time communicating from [name]. At alter prayed and cried and came to mind [name] telling her used to talk to me.  Came today to understand cannot make promises she cannot keep.  Ask [name] let me wake up and not want them. Says praying to [name] answer given in time. Hit her how used to pray after coffee and cigarette in the morning. Patient says she doesn't make promises to people so why would do with [name] and then not carrying through. Try not to start something could get in habit that don't want to continue. Speaks up for herself. Not going to make promises rather be upfront. Say what mean without trying to be mean. Loves being where she is and likes paying off debt. Comfortable with church.   Therapist impressed and provided positive feedback for patient rescue of a dog she is leaving and to go to her brother's who is  willing to take the dog.  Therapist shared about trauma and learning how to work on it that you have to ask yourself why the things happen.  Explored this with her brother why things turned out badly when she lived with him and could not identify being picked on, not having a close relationship with father being gay and that being difficult his whole life or some of the reasons why.  Patient explored her history she said feels messed things up a lot of times therapist noted were not defined by her mistake as well as drugs playing  plan a major role.  Therapist asked patient if she feels good about turning her life around now very spiritual religious therapist herself is impressed and patient said it does make her feel good.  Discussed spirituality and communicating with [name] feels could have  done this without [name]'s help.  Therapist do a parallel between patient not making promises to [name] and therapist saying taking the softer way with ourselves the same way asking for help and trying helps as far as coping not beating herself self.  Noted another positive patient wants to work on not smoking by joining a class a charge.  Therapist provided space and support for patient to talk about thoughts and feelings in session.     Suicidal/Homicidal: No  Plan: Return again in 2 weeks.2.  Patient processed thoughts and feelings to help with coping, work on trauma stressors where needed  Diagnosis: PTSD, hallucinations, GAD, Mood Disorder in conditions classified elsewhere  Collaboration of Care: Other none needed  Patient/Guardian was advised Release of Information must be obtained prior to any record release in order to collaborate their care with an outside provider. Patient/Guardian was advised if they have not already done so to contact the registration department to sign all necessary forms in order for [place] to release information regarding their care.   Consent: Patient/Guardian gives verbal consent for treatment  and assignment of benefits for services provided during this visit. Patient/Guardian expressed understanding and agreed to proceed.   [name], LCSW 04/04/2022

## 2022-04-09 ENCOUNTER — Encounter (HOSPITAL_COMMUNITY): Payer: Self-pay | Admitting: Psychiatry

## 2022-04-09 ENCOUNTER — Telehealth (INDEPENDENT_AMBULATORY_CARE_PROVIDER_SITE_OTHER): Payer: Medicare Other | Admitting: Psychiatry

## 2022-04-09 DIAGNOSIS — R443 Hallucinations, unspecified: Secondary | ICD-10-CM | POA: Diagnosis not present

## 2022-04-09 DIAGNOSIS — F411 Generalized anxiety disorder: Secondary | ICD-10-CM

## 2022-04-09 DIAGNOSIS — F0630 Mood disorder due to known physiological condition, unspecified: Secondary | ICD-10-CM

## 2022-04-09 DIAGNOSIS — F4310 Post-traumatic stress disorder, unspecified: Secondary | ICD-10-CM | POA: Diagnosis not present

## 2022-04-09 MED ORDER — QUETIAPINE FUMARATE 100 MG PO TABS: 100.0000 mg | ORAL_TABLET | Freq: Every day | ORAL | 0 refills | Status: DC

## 2022-04-09 NOTE — Progress Notes (Signed)
[place] Follow up visit  Patient Identification: Joan [name] MRN:  [id number] Date of Evaluation:  04/09/2022 Referral Source:Therapist [name] Chief Complaint:    depression follow up  Visit Diagnosis:    ICD-10-CM   1. Mood disorder in conditions classified elsewhere  F06.30     2. Hallucinations  R44.3     3. Generalized anxiety disorder  F41.1     4. PTSD (post-traumatic stress disorder)  F43.10     Virtual Visit via Video Note  I connected with [name] on 04/09/22 at  4:30 PM EST by a video enabled telemedicine application and verified that I am speaking with the correct person using two identifiers.  Location: Patient: home Provider: office   I discussed the limitations of evaluation and management by telemedicine and the availability of in person appointments. The patient expressed understanding and agreed to proceed.    I discussed the assessment and treatment plan with the patient. The patient was provided an opportunity to ask questions and all were answered. The patient agreed with the plan and demonstrated an understanding of the instructions.   The patient was advised to call back or seek an in-person evaluation if the symptoms worsen or if the condition fails to improve as anticipated.  I provided 20  minutes of non-face-to-face time during this encounter.     History of Present Illness: Patient is a 62 years old currently widow Caucasian female initially referred by her therapist for management of mood symptoms, depression possible PTSD and anxiety  Patient has been in rehab after being admitted for COPD exacerbation Has her own apartment, last vist was couple of weeks ago was doing better  This is early appointment due to recurrence of paranoia, hallucinations, when she goes to church she feel people are talking about her  She feels guarded Depression same but paranoia is there and it causes her mind to race  She avoid lying in bed  during the day  Gets lonely but tries to engage with friend or church  Trying to work on her smoking and copd   Aggravating factor; difficult childhood, grief,difficult relationship with middle kid, hip replacement medical comorbidity,  lonliness Modifying factor: church, brother  Duration adult life Severity recurrence of paranoia     Past Psychiatric History: depression, drug use  Previous Psychotropic Medications: Yes   Substance Abuse History in the last 12 months:  No.  Consequences of Substance Abuse: history of extensive drug use uptil 8 years ago and have been in rehab programs  Past Medical History:  Past Medical History:  Diagnosis Date   Acute respiratory failure with hypoxia ([place])    Alcoholism and drug addiction in family    Anxiety    Appendicitis    Arthritis    "knees, hips, lower back" (09/25/2016)   Asthma    Bipolar disorder ([place])    Chronic bronchitis ([place])    "all the time" (09/25/2016)   Chronic leg pain    RLE   Chronic lower back pain    COPD (chronic obstructive pulmonary disease) ([place])    Depression    Diabetes mellitus without complication ([place])    Diabetic neuropathy ([place])    Early cataracts, bilateral    Emphysema of lung ([place])    Generalized anxiety disorder 02/09/2020   GERD (gastroesophageal reflux disease)    Headache    Hepatitis C    "treated back in 2001-2002"   High cholesterol    Hypertension  Hypothyroidism    Incarcerated ventral hernia 04/04/2017   Mood disorder in conditions classified elsewhere 02/09/2020   On home oxygen therapy    "2L; at nighttime" (04/04/2017)   OSA on CPAP    CPAP with Oxygen 3 liters   Paranoid ([place])    Pneumonia    "all the time" (09/25/2016)   Positive PPD    with negative chest x ray   PTSD (post-traumatic stress disorder) 02/09/2020   Tachycardia    patient reports with exertion ; denies any other conditions    Thyroid disease    Wears glasses    Wears partial dentures     Past  Surgical History:  Procedure Laterality Date   ANTERIOR CERVICAL DECOMP/DISCECTOMY FUSION N/A 02/14/2022   Procedure: Anterior Cervical Decompression/Discectomy Fusion Cervical Three-Cervical Four;  Surgeon: Vallarie Mare, MD;  Location: [facility];  Service: Neurosurgery;  Laterality: N/A;  3C   APPENDECTOMY     BACK SURGERY     BIOPSY  11/24/2018   Procedure: BIOPSY;  Surgeon: Thornton Park, MD;  [facility];  Service: Gastroenterology;;   CARDIAC CATHETERIZATION     COLONOSCOPY     COLONOSCOPY WITH PROPOFOL N/A 11/24/2018   Procedure: COLONOSCOPY WITH PROPOFOL;  Surgeon: Thornton Park, MD;  [facility];  Service: Gastroenterology;  Laterality: N/A;   CYST EXCISION     removal of cyst in sinuses   DILATION AND CURETTAGE OF UTERUS     ESOPHAGOGASTRODUODENOSCOPY (EGD) WITH PROPOFOL N/A 11/24/2018   Procedure: ESOPHAGOGASTRODUODENOSCOPY (EGD) WITH PROPOFOL;  Surgeon: Thornton Park, MD;  [facility];  Service: Gastroenterology;  Laterality: N/A;   FOOT SURGERY Bilateral    bone removed from 4th and 5 th toes and put in pin then took pin out   [place]  ~ 2014/2015   "removed mesh & infection"   [place] N/A 04/04/2017   Procedure: LAPAROSCOPIC INCISIONAL HERNIA REPAIR WITH MESH;  Surgeon: Fanny Skates, MD;  Location: [place] [facility];  Service: General;  Laterality: N/A;   LACERATION REPAIR Right    repair wrist artery from laceration from ice skate   LAPAROSCOPIC APPENDECTOMY N/A 03/31/2019   Procedure: APPENDECTOMY LAPAROSCOPIC;  Surgeon: Ralene Ok, MD;  [facility];  Service: General;  Laterality: N/A;   LAPAROSCOPIC APPENDECTOMY N/A 07/09/2019   Procedure: APPENDECTOMY LAPAROSCOPIC;  Surgeon: Ralene Ok, MD;  Location: [facility];  Service: General;  Laterality: N/A;   LAPAROSCOPIC INCISIONAL / UMBILICAL / [place]  04/04/2017   LAPAROSCOPIC INCISIONAL HERNIA REPAIR WITH MESH    LIPOMA EXCISION Right    fattty lipoma in neck   NASAL SEPTUM SURGERY     POSTERIOR LUMBAR FUSION  2015/2016 X 2   "added rods and screws"   SINOSCOPY     TOTAL HIP ARTHROPLASTY Right 02/05/2017   Procedure: RIGHT TOTAL HIP ARTHROPLASTY;  Surgeon: [name], MD;  [facility];  Service: Orthopedics;  Laterality: Right;   TOTAL HIP ARTHROPLASTY Left 06/18/2017   Procedure: LEFT TOTAL HIP ARTHROPLASTY;  Surgeon: [name], MD;  [facility];  Service: Orthopedics;  Laterality: Left;   TOTAL HIP REVISION Left 01/26/2019   Procedure: TOTAL HIP REVISION;  Surgeon: Paralee Cancel, MD;  [facility];  Service: Orthopedics;  Laterality: Left;  2 hrs   TUBAL LIGATION     UMBILICAL HERNIA REPAIR  ~ 2013   w/mesh   UPPER GI ENDOSCOPY      Family Psychiatric  History: FAther : alcohol use  Family History:  Family History  Problem Relation Age of Onset   Diabetes Mother    Hypertension Mother    Hypertension Father    Cancer Father        lung   Breast cancer Neg Hx     Social History:   Social History   Socioeconomic History   Marital status: Widowed    Spouse name: Not on file   Number of children: 3   Years of education: Not on file   Highest education level: Not on file  Occupational History   Not on file  Tobacco Use   Smoking status: Every Day    Packs/day: 1.00    Years: 44.00    Total pack years: 44.00    Types: Cigarettes    Passive exposure: Current   Smokeless tobacco: Never   Tobacco comments:    1 cig per day 01/29/22  Vaping Use   Vaping Use: Never used  Substance and Sexual Activity   Alcohol use: Not Currently   Drug use: Not Currently    Types: "Crack" cocaine    Comment:  "nothing since 01/12/2012"   Sexual activity: Not Currently    Partners: Male  Other Topics Concern   Not on file  Social History Narrative   Not on file   Social Determinants of Health   Financial Resource Strain: High Risk (05/05/2020)   Overall  Financial Resource Strain (CARDIA)    Difficulty of Paying Living Expenses: Hard  Food Insecurity: No Food Insecurity (11/15/2021)   Hunger Vital Sign    Worried About Running Out of Food in the Last Year: Never true    [place] in the Last Year: Never true  Recent Concern: Food Insecurity - Food Insecurity Present (08/27/2021)   Hunger Vital Sign    Worried About Running Out of Food in the Last Year: Sometimes true    Ran Out of Food in the Last Year: Never true  Transportation Needs: No Transportation Needs (11/15/2021)   PRAPARE - Hydrologist (Medical): No    Lack of Transportation (Non-Medical): No  Physical Activity: Inactive (08/27/2021)   Exercise Vital Sign    Days of Exercise per Week: 0 days    Minutes of Exercise per Session: 0 min  Stress: Stress Concern Present (12/26/2020)   [place]    Feeling of Stress : Very much  Social Connections: Socially Isolated (08/27/2021)   Social Connection and Isolation Panel [NHANES]    Frequency of Communication with Friends and Family: More than three times a week    Frequency of Social Gatherings with Friends and Family: Never    Attends Religious Services: Never    Marine scientist or Organizations: No    Attends Archivist Meetings: Never    Marital Status: Widowed      Allergies:   Allergies  Allergen Reactions   Keflex [Cephalexin] Rash and Other (See Comments)    Has tolerated amoxicillin since had keflex   Shellfish Allergy Shortness Of Breath, Nausea And Vomiting and Other (See Comments)    Stomach cramps   Tetracyclines & Related Anaphylaxis, Swelling and Other (See Comments)    Throat swelling requiring hospitalization   Dilaudid [Hydromorphone Hcl] Other (See Comments)    "Lethargy"   Prednisone Other (See Comments)    "counter reacts", has tolerated IV medrol   Clindamycin/Lincomycin Other (  See  Comments)    Burning sensation in her mouth down her throat    Incruse Ellipta [Umeclidinium Bromide] Nausea Only   Molds & Smuts Itching   Tuberculin Tests Other (See Comments)    False postive    Metabolic Disorder Labs: Lab Results  Component Value Date   HGBA1C 7.0 (H) 02/08/2022   MPG 154 02/08/2022   MPG 148.46 03/14/2021   No results found for: "PROLACTIN" Lab Results  Component Value Date   CHOL 140 06/11/2021   TRIG 98.0 06/11/2021   HDL 51.10 06/11/2021   CHOLHDL 3 06/11/2021   VLDL 19.6 06/11/2021   LDLCALC 69 06/11/2021   LDLCALC 58 03/07/2020   Lab Results  Component Value Date   TSH 0.93 06/11/2021    Therapeutic Level Labs: No results found for: "LITHIUM" No results found for: "CBMZ" No results found for: "VALPROATE"  Current Medications: Current Outpatient Medications  Medication Sig Dispense Refill   Accu-Chek Softclix Lancets lancets Use as instructed 100 each 12   acyclovir (ZOVIRAX) 400 MG tablet TAKE 1 TABLET BY MOUTH  TWICE DAILY (Patient taking differently: Take 400 mg by mouth in the morning.) 180 tablet 3   albuterol (PROVENTIL) (2.5 MG/3ML) 0.083% nebulizer solution Take 3 mLs (2.5 mg total) by nebulization 2 (two) times daily. And every 6 hours as needed.  DX: J44.9 360 mL 3   atenolol (TENORMIN) 25 MG tablet TAKE 1 TABLET BY MOUTH  DAILY 90 tablet 3   atorvastatin (LIPITOR) 20 MG tablet TAKE 1 TABLET BY MOUTH ONCE  DAILY 100 tablet 2   blood glucose meter kit and supplies Dispense based on patient and insurance preference. Use up to two times daily as directed. (FOR ICD-10 E10.9, E11.9). 1 each 0   Budeson-Glycopyrrol-Formoterol (BREZTRI AEROSPHERE) 160-9-4.8 MCG/ACT AERO Inhale 2 puffs into the lungs in the morning and at bedtime. 10.7 g 0   buPROPion (WELLBUTRIN XL) 300 MG 24 hr tablet Take 1 tablet (300 mg total) by mouth daily. 90 tablet 0   busPIRone (BUSPAR) 5 MG tablet Take 1 tablet (5 mg total) by mouth at bedtime. 30 tablet 0    Camphor-Menthol-Methyl Sal (SALONPAS) 3.03-02-08 % PTCH Place 1 patch onto the skin daily as needed (neck pain).     cetirizine (ZYRTEC) 10 MG tablet Take 1 tablet (10 mg total) by mouth 2 (two) times daily. (Patient taking differently: Take 10 mg by mouth daily.) 180 tablet 3   cyclobenzaprine (FLEXERIL) 10 MG tablet TAKE 1 TABLET BY MOUTH AT  BEDTIME 90 tablet 1   diazepam (VALIUM) 5 MG tablet Take 1 tablet (5 mg total) by mouth every 8 (eight) hours as needed for anxiety. 21 tablet 0   diphenhydrAMINE (BENADRYL) 25 mg capsule Take 25 mg by mouth 2 (two) times daily.     fluticasone (FLONASE) 50 MCG/ACT nasal spray Place 2 sprays into both nostrils daily. (Patient taking differently: Place 2 sprays into both nostrils daily as needed for allergies.) 48 g 1   glucose blood test strip Use as instructed 100 each 12   hydrochlorothiazide (HYDRODIURIL) 25 MG tablet TAKE 1 TABLET BY MOUTH DAILY 100 tablet 2   Ipratropium-Albuterol (COMBIVENT RESPIMAT) 20-100 MCG/ACT AERS respimat Inhale 1 puff into the lungs every 6 (six) hours as needed for wheezing or shortness of breath. 12 g 3   levothyroxine (SYNTHROID) 25 MCG tablet TAKE 1 TABLET BY MOUTH DAILY  BEFORE BREAKFAST 100 tablet 2   MELATONIN PO Take 1 tablet by mouth at  bedtime as needed (sleep).     montelukast (SINGULAIR) 10 MG tablet TAKE 1 TABLET BY MOUTH AT  BEDTIME 100 tablet 2   naproxen (NAPROSYN) 500 MG tablet Take 1 tablet (500 mg total) by mouth 2 (two) times daily with a meal. 180 tablet 3   nystatin-triamcinolone ointment (MYCOLOG) Apply 1 application topically 2 (two) times daily as needed (applied to skin folds/groins/under breast skin irritation rash.). 100 g 3   oxyCODONE-acetaminophen (PERCOCET) 5-325 MG tablet Take 1-2 tablets by mouth every 6 (six) hours as needed for severe pain. 50 tablet 0   OXYGEN Inhale 3 L/min into the lungs See admin instructions. Inhale  3 L/min of oxygen into the lungs w/CPAP at bedtime and as needed for  shortness of breath with "strenuous activity" during the day     pantoprazole (PROTONIX) 40 MG tablet TAKE 1 TABLET BY MOUTH DAILY 100 tablet 3   PATADAY 0.2 % SOLN Place 1 drop into both eyes daily.     polyethylene glycol (MIRALAX / GLYCOLAX) 17 g packet Take 17 g by mouth daily as needed for mild constipation. 28 packet 0   pregabalin (LYRICA) 300 MG capsule Take 1 capsule (300 mg total) by mouth 2 (two) times daily. 60 capsule 3   QUEtiapine (SEROQUEL) 50 MG tablet Take 1 tablet (50 mg total) by mouth at bedtime. 30 tablet 2   roflumilast (DALIRESP) 500 MCG TABS tablet Take 1 tablet (500 mcg total) by mouth daily. 90 tablet 3   traZODone (DESYREL) 100 MG tablet TAKE 1 TABLET BY MOUTH AT  BEDTIME 100 tablet 3   venlafaxine XR (EFFEXOR-XR) 150 MG 24 hr capsule TAKE 1 CAPSULE BY MOUTH DAILY  WITH BREAKFAST 100 capsule 3   witch hazel-glycerin (TUCKS) pad Apply topically as needed for itching.     No current facility-administered medications for this visit.     Psychiatric Specialty Exam: Review of Systems  Cardiovascular:  Negative for chest pain.  Psychiatric/Behavioral:  Negative for substance abuse and suicidal ideas.     There were no vitals taken for this visit.There is no height or weight on file to calculate BMI.  General Appearance:casual  Eye Contact: fair  Speech:  Slow  Volume:  Decreased  Mood: fair, somewhat stressed  Affect:  congruent  Thought Process:  Goal Directed  Orientation:  Full (Time, Place, and Person)  Thought Content: somewhat paranoid   Suicidal Thoughts:  No  Homicidal Thoughts:  No  Memory:  Immediate;   Fair Recent;   Fair  Judgement:  Fair  Insight:  Shallow  Psychomotor Activity:  Decreased  Concentration:  Concentration: Fair and Attention Span: Fair  Recall:  [employer] of Knowledge:Fair  Language: Fair  Akathisia:  No  Handed:    AIMS (if indicated): no involuntary movements  Assets:  Desire for Improvement Leisure Time Social Support   ADL's: limited due to recent surgery  Cognition: WNL  Sleep:   variable   Screenings: Mini-Mental    Flowsheet Row Clinical Support from 04/06/2018 in [facility]  Total Score (max 30 points ) 29      PHQ2-9    [place] Visit from 10/12/2021 in [facility] from 08/27/2021 in [facility] from 05/04/2021 in [facility] at [facility] Visit from 03/27/2021 in [facility] [facility] from 01/31/2021 in  [facility] at [facility]  PHQ-2 Total Score 2 0 1 0 0  PHQ-9 Total Score 2 -- -- 0 --      Flowsheet Row Admission (Discharged) from 02/14/2022 in [facility] from 02/08/2022 in [facility] PREADMISSION TESTING Video Visit from 06/08/2021 in [facility] at [place] No Risk No Risk No Risk       Assessment and Plan: as follows  Prior documentation reviewed    Mood disorder unspecified: relavant to multiple medical concerns and past history of using drugs,  mood is fair but recurrence of paranoi Increase seroquel to 137m, can take 548m2 tabs at night   PTSD: baseline, continue effexor  Insomnia:  reviewed sleep hygiene, increase seroquel to 1005mfor paranoia and hallucinations.    Understands to keep awake during the day Anxiety fluctuates continue effexor  Fu 1- 67m 25madeMerian Capron 2/13/20244:24 PM

## 2022-04-12 ENCOUNTER — Ambulatory Visit: Payer: Medicare Other | Admitting: Acute Care

## 2022-04-12 ENCOUNTER — Encounter: Payer: Self-pay | Admitting: Acute Care

## 2022-04-12 VITALS — BP 118/88 | HR 68 | Temp 97.9°F | Ht 68.0 in | Wt 192.2 lb

## 2022-04-12 DIAGNOSIS — B370 Candidal stomatitis: Secondary | ICD-10-CM

## 2022-04-12 DIAGNOSIS — G4733 Obstructive sleep apnea (adult) (pediatric): Secondary | ICD-10-CM

## 2022-04-12 DIAGNOSIS — F17210 Nicotine dependence, cigarettes, uncomplicated: Secondary | ICD-10-CM

## 2022-04-12 DIAGNOSIS — J449 Chronic obstructive pulmonary disease, unspecified: Secondary | ICD-10-CM | POA: Diagnosis not present

## 2022-04-12 DIAGNOSIS — J9611 Chronic respiratory failure with hypoxia: Secondary | ICD-10-CM

## 2022-04-12 DIAGNOSIS — F17200 Nicotine dependence, unspecified, uncomplicated: Secondary | ICD-10-CM

## 2022-04-12 MED ORDER — CLOTRIMAZOLE 10 MG MT TROC: 10.0000 mg | Freq: Three times a day (TID) | OROMUCOSAL | 0 refills | Status: AC

## 2022-04-12 MED ORDER — VARENICLINE TARTRATE (STARTER) 0.5 MG X 11 & 1 MG X 42 PO TBPK: ORAL_TABLET | ORAL | 1 refills | Status: DC

## 2022-04-12 NOTE — Patient Instructions (Addendum)
It is good to see you today. I will send in some Mycelex troches for the irritation to your tongue.  Continue wearing your oxygen at 3 L [place] , titrate oxygen as needed to maintain sats at 88%.  Get a new oxygen saturation monitor to help monitor your oxygen levels.  Keep up the good work on your CPAP.  Your down load looks great.  Continue Breztri as you have been doing. Use 2 puffs in the morning and 2 puffs in the evening.  Rinse mouth after use.  Use the albuterol  nebs as needed for shortness of breath or wheezing.  Bring in the paperwork needed to help get your assistance for your combivent.  Follow up with Judson Roch NP or Dr. Elsworth Soho in 3 months  Remember to call if you need [place] sooner . Note your daily symptoms > remember "red flags" for COPD:  Increase in cough, increase in sputum production, increase in shortness of breath or activity intolerance. If you notice these symptoms, please call to be seen.   Please work on quitting smoking.  Call 1-800 QUI NOW for free nicotine patches gum or mints. You really need to quit.  I have sent in the starter pack for Chantix.  Take as directed.

## 2022-04-12 NOTE — Progress Notes (Signed)
History of Present Illness Joan [name] is a 62 y.o. female current every day smoker ( 44++  pack year smoking history) with severe COPD and OSA on CPAP therapy ( 10 cm H2O). Meds -allergy listed to prednisone-this caused pedal edema, prefers Medrol when needed . She is followed by Dr. Elsworth Soho.  Maintenance is Breztri CPAP: AutoCPAP DME: [employer] Mod[employer]:  Pressure: 5-12cm  Mask Brand and Size: ResMed Full Face Mask, Small, F20    02/07/2021 hfb   SATURATION QUALIFICATIONS: (This note is used to comply with regulatory documentation for home oxygen) Patient Saturations on Room Air at Rest = 96% Patient Saturations on Room Air while Ambulating = 87% Patient Saturations on 3 Liters of oxygen while Ambulating = 95%  Medications for anxiety: BuSpar 5 mg p.o. 3 times daily, Seroquel 50 mg nightly, trazodone 100 mg p.o. nightly, Effexor 150 mg p.o. daily.  She also requested percocet prior to discharge   04/12/2022 Pt. Presents for hospital follow up after  COPD exacerbation. Admission 02/23/2023 and discharge 02/28/2022. She was admitted for dyspnea. She was treated with antibiotics, prednisone and improved, then she refused discharge after she was  medically cleared for discharge.  She is here today with some complaints of headache, shortness of breath . She is wearing her oxygen at 3 L [place] pulsed oxygen. She is coughing up copious secretions. They are white. She denies any fever. She gets tired and she is short of breath. She is using Breztri, nd what remains of her Daliresp as it was discontinued.  She needs her Combivent refilled but she is currently having to reapply for assistance. She is completing this paperwork. She  also has nebs at home which she uses as needed.   She told me she sometimes uses her Judithann Sauger more than twice daily, I have told her she cannot do this. We discussed that if she needs additional medication, sh needs to use her albuterol. She verbalized understanding.  She  has good compliance with her CPAP nightly. She uses her device 25 of 30 days and she had AHI of 1.6. She does complain of some white dots on her tongue. She states she can clear them off her tongue in the morning , then they return the next day.   Pt. Is due for  Lung Cancer screening scan os scheduled for 05/02/2022. We will send in a prescription for chantix starter pack  as this has helped her in the past.  Test Results: CXR 02/24/2022 No acute cardiopulmonary process is demonstrated. COPD and  bronchitic features. Aortic atherosclerosis. No significant change.   05/01/2021 LDCT Lung Cancer Screening  Lung-RADS 2, benign appearance or behavior. Continue annual screening with low-dose chest CT without contrast in 12 months. 2. Two-vessel coronary atherosclerosis. 3. Aortic Atherosclerosis (ICD10-I70.0) and Emphysema (ICD10-J43.9).       Latest Ref Rng & Units 02/08/2022    2:30 PM 06/11/2021    3:05 PM 04/04/2021    3:23 AM  CBC  WBC 4.0 - 10.5 K/uL 20.4  8.5  15.4   Hemoglobin 12.0 - 15.0 g/dL 12.7  12.0  11.6   Hematocrit 36.0 - 46.0 % 38.3  36.0  36.1   Platelets 150 - 400 K/uL 266  261.0  279        Latest Ref Rng & Units 01/29/2022    3:26 PM 06/11/2021    3:05 PM 04/05/2021    4:02 AM  BMP  Glucose 70 - 99 mg/dL 128  197  343   BUN 6 - 23 mg/dL 19  9  24   $ Creatinine 0.40 - 1.20 mg/dL 1.04  1.04  0.95   Sodium 135 - 145 mEq/L 135  137  133   Potassium 3.5 - 5.1 mEq/L 3.4  4.0  4.9   Chloride 96 - 112 mEq/L 94  98  94   CO2 19 - 32 mEq/L 31  30  29   $ Calcium 8.4 - 10.5 mg/dL 9.6  9.2  9.4     BNP    Component Value Date/Time   BNP 55.2 03/13/2021 2251    ProBNP No results found for: "PROBNP"  PFT    Component Value Date/Time   FEV1PRE 1.06 03/17/2019 1444   FEV1POST 1.24 03/17/2019 1444   FVCPRE 2.35 03/17/2019 1444   FVCPOST 2.66 03/17/2019 1444   TLC 5.72 03/17/2019 1444   DLCOUNC 9.80 03/17/2019 1444   PREFEV1FVCRT 45 03/17/2019 1444   PSTFEV1FVCRT 47  03/17/2019 1444    No results found.   Past medical hx Past Medical History:  Diagnosis Date   Acute respiratory failure with hypoxia ([place])    Alcoholism and drug addiction in family    Anxiety    Appendicitis    Arthritis    "knees, hips, lower back" (09/25/2016)   Asthma    Bipolar disorder ([place])    Chronic bronchitis ([place])    "all the time" (09/25/2016)   Chronic leg pain    RLE   Chronic lower back pain    COPD (chronic obstructive pulmonary disease) ([place])    Depression    Diabetes mellitus without complication (HCC)    Diabetic neuropathy ([place])    Early cataracts, bilateral    Emphysema of lung ([place])    Generalized anxiety disorder 02/09/2020   GERD (gastroesophageal reflux disease)    Headache    Hepatitis C    "treated back in 2001-2002"   High cholesterol    Hypertension    Hypothyroidism    Incarcerated ventral hernia 04/04/2017   Mood disorder in conditions classified elsewhere 02/09/2020   On home oxygen therapy    "2L; at nighttime" (04/04/2017)   OSA on CPAP    CPAP with Oxygen 3 liters   Paranoid ([place])    Pneumonia    "all the time" (09/25/2016)   Positive PPD    with negative chest x ray   PTSD (post-traumatic stress disorder) 02/09/2020   Tachycardia    patient reports with exertion ; denies any other conditions    Thyroid disease    Wears glasses    Wears partial dentures      Social History   Tobacco Use   Smoking status: Every Day    Packs/day: 1.00    Years: 44.00    Total pack years: 44.00    Types: Cigarettes    Passive exposure: Current   Smokeless tobacco: Never   Tobacco comments:    1 cig per day 01/29/22  Vaping Use   Vaping Use: Never used  Substance Use Topics   Alcohol use: Not Currently   Drug use: Not Currently    Types: "Crack" cocaine    Comment:  "nothing since 01/12/2012"    Ms.Borum reports that she has been smoking cigarettes. She has a 44.00 pack-year smoking history. She has been exposed to tobacco smoke. She  has never used smokeless tobacco. She reports that she does not currently use alcohol. She reports that she does not currently use drugs after having  used the following drugs: "Crack" cocaine.  Tobacco Cessation: Current Every Day Smoker Smoking cessation counseling given Chantix starter pack prescribed  Past surgical hx, Family hx, Social hx all reviewed.  Current Outpatient Medications on File Prior to Visit  Medication Sig   Accu-Chek Softclix Lancets lancets Use as instructed   acyclovir (ZOVIRAX) 400 MG tablet TAKE 1 TABLET BY MOUTH  TWICE DAILY (Patient taking differently: Take 400 mg by mouth in the morning.)   albuterol (PROVENTIL) (2.5 MG/3ML) 0.083% nebulizer solution Take 3 mLs (2.5 mg total) by nebulization 2 (two) times daily. And every 6 hours as needed.  DX: J44.9   atenolol (TENORMIN) 25 MG tablet TAKE 1 TABLET BY MOUTH  DAILY   atorvastatin (LIPITOR) 20 MG tablet TAKE 1 TABLET BY MOUTH ONCE  DAILY   blood glucose meter kit and supplies Dispense based on patient and insurance preference. Use up to two times daily as directed. (FOR ICD-10 E10.9, E11.9).   Budeson-Glycopyrrol-Formoterol (BREZTRI AEROSPHERE) 160-9-4.8 MCG/ACT AERO Inhale 2 puffs into the lungs in the morning and at bedtime.   buPROPion (WELLBUTRIN XL) 300 MG 24 hr tablet Take 1 tablet (300 mg total) by mouth daily.   busPIRone (BUSPAR) 5 MG tablet Take 1 tablet (5 mg total) by mouth at bedtime.   Camphor-Menthol-Methyl Sal (SALONPAS) 3.03-02-08 % PTCH Place 1 patch onto the skin daily as needed (neck pain).   cyclobenzaprine (FLEXERIL) 10 MG tablet TAKE 1 TABLET BY MOUTH AT  BEDTIME   diazepam (VALIUM) 5 MG tablet Take 1 tablet (5 mg total) by mouth every 8 (eight) hours as needed for anxiety.   diphenhydrAMINE (BENADRYL) 25 mg capsule Take 25 mg by mouth 2 (two) times daily.   glucose blood test strip Use as instructed   hydrochlorothiazide (HYDRODIURIL) 25 MG tablet TAKE 1 TABLET BY MOUTH DAILY    Ipratropium-Albuterol (COMBIVENT RESPIMAT) 20-100 MCG/ACT AERS respimat Inhale 1 puff into the lungs every 6 (six) hours as needed for wheezing or shortness of breath.   levothyroxine (SYNTHROID) 25 MCG tablet TAKE 1 TABLET BY MOUTH DAILY  BEFORE BREAKFAST   MELATONIN PO Take 1 tablet by mouth at bedtime as needed (sleep).   montelukast (SINGULAIR) 10 MG tablet TAKE 1 TABLET BY MOUTH AT  BEDTIME   naproxen (NAPROSYN) 500 MG tablet Take 1 tablet (500 mg total) by mouth 2 (two) times daily with a meal.   nystatin-triamcinolone ointment (MYCOLOG) Apply 1 application topically 2 (two) times daily as needed (applied to skin folds/groins/under breast skin irritation rash.).   oxyCODONE-acetaminophen (PERCOCET) 5-325 MG tablet Take 1-2 tablets by mouth every 6 (six) hours as needed for severe pain.   OXYGEN Inhale 3 L/min into the lungs See admin instructions. Inhale  3 L/min of oxygen into the lungs w/CPAP at bedtime and as needed for shortness of breath with "strenuous activity" during the day   pantoprazole (PROTONIX) 40 MG tablet TAKE 1 TABLET BY MOUTH DAILY   PATADAY 0.2 % SOLN Place 1 drop into both eyes daily.   polyethylene glycol (MIRALAX / GLYCOLAX) 17 g packet Take 17 g by mouth daily as needed for mild constipation.   pregabalin (LYRICA) 300 MG capsule Take 1 capsule (300 mg total) by mouth 2 (two) times daily.   QUEtiapine (SEROQUEL) 100 MG tablet Take 1 tablet (100 mg total) by mouth at bedtime. Take one at night   roflumilast (DALIRESP) 500 MCG TABS tablet Take 1 tablet (500 mcg total) by mouth daily.   traZODone (DESYREL) 100  MG tablet TAKE 1 TABLET BY MOUTH AT  BEDTIME   venlafaxine XR (EFFEXOR-XR) 150 MG 24 hr capsule TAKE 1 CAPSULE BY MOUTH DAILY  WITH BREAKFAST   witch hazel-glycerin (TUCKS) pad Apply topically as needed for itching.   cetirizine (ZYRTEC) 10 MG tablet Take 1 tablet (10 mg total) by mouth 2 (two) times daily. (Patient taking differently: Take 10 mg by mouth daily.)    fluticasone (FLONASE) 50 MCG/ACT nasal spray Place 2 sprays into both nostrils daily. (Patient taking differently: Place 2 sprays into both nostrils daily as needed for allergies.)   No current facility-administered medications on file prior to visit.     Allergies  Allergen Reactions   Keflex [Cephalexin] Rash and Other (See Comments)    Has tolerated amoxicillin since had keflex   Shellfish Allergy Shortness Of Breath, Nausea And Vomiting and Other (See Comments)    Stomach cramps   Tetracyclines & Related Anaphylaxis, Swelling and Other (See Comments)    Throat swelling requiring hospitalization   Dilaudid [Hydromorphone Hcl] Other (See Comments)    "Lethargy"   Prednisone Other (See Comments)    "counter reacts", has tolerated IV medrol   Clindamycin/Lincomycin Other (See Comments)    Burning sensation in her mouth down her throat    Incruse Ellipta [Umeclidinium Bromide] Nausea Only   Molds & Smuts Itching   Tuberculin Tests Other (See Comments)    False postive    Review Of Systems:  Constitutional:   No  weight loss, night sweats,  Fevers, chills, fatigue, or  lassitude.  HEENT:   + headaches,  Difficulty swallowing,  Tooth/dental problems, or  Sore throat,                No sneezing, itching, ear ache, nasal congestion, post nasal drip,   CV:  No chest pain,  Orthopnea, PND, swelling in lower extremities, anasarca, dizziness, palpitations, syncope.   GI  No heartburn, indigestion, abdominal pain, nausea, vomiting, diarrhea, change in bowel habits, loss of appetite, bloody stools.   Resp: + shortness of breath with exertion less at rest.  + baseline  excess mucus, + baseline  productive cough,  No non-productive cough,  No coughing up of blood.  No change in color of mucus.  + baseline  wheezing.  No chest wall deformity  Skin: no rash or lesions.  GU: no dysuria, change in color of urine, no urgency or frequency.  No flank pain, no hematuria   MS:  No joint pain or  swelling.  No decreased range of motion.  No back pain.  Psych:  No change in mood or affect. No depression or anxiety.  No memory loss.   Vital Signs BP 118/88   Pulse 68   Temp 97.9 F (36.6 C) (Oral)   Ht 5' 8"$  (1.727 m)   Wt 192 lb 3.2 oz (87.2 kg)   SpO2 96%   BMI 29.22 kg/m    Physical Exam:  General- No distress,  A&Ox3, pleasant , in wheelchair ENT: No sinus tenderness, TM clear, pale nasal mucosa, + oral exudate, thrush,no post nasal drip, no LAN Cardiac: S1, S2, regular rate and rhythm, no murmur Chest: +  wheeze/ No rales/ dullness; no accessory muscle use, no nasal flaring, no sternal retractions Abd.: Soft Non-tender, ND, BS +, Body mass index is 29.22 kg/m.  Ext: No clubbing cyanosis, edema Neuro:  normal strength Skin: No rashes, warm and dry Psych: normal mood and behavior   Assessment/Plan  COPD Chronic  hypoxic Respiratory Failure  Recent Exacerbation requiring [facility] at [facility]  02/22/2022-02/28/2022 Plan Continue wearing your oxygen at 3 L [place] , titrate oxygen as needed to maintain sats at 88%.  Get a new oxygen saturation monitor to help monitor your oxygen levels. Continue Breztri as you have been doing. Use 2 puffs in the morning and 2 puffs in the evening.  Rinse mouth after use.  Use the albuterol  nebs as needed for shortness of breath or wheezing.  Bring in the paperwork needed to help get your assistance for your combivent.  Follow up with Judson Roch NP or Dr. Elsworth Soho in 3 months  Remember to call if you need [place] sooner . Note your daily symptoms > remember "red flags" for COPD:  Increase in cough, increase in sputum production, increase in shortness of breath or activity intolerance. If you notice these symptoms, please call to be seen.   Please work on quitting smoking.  Call 1-800 QUI NOW for free nicotine patches gum or mints. You really need to quit.  I have sent in the starter pack for Chantix.  Take as directed.  OSA on  CPAP Great Compliance  AHI of 1.6 on Auto Set 5-15 cm H2O Plan Keep up the good work on your CPAP.  Your down load looks great.  Continue on CPAP at bedtime. You appear to be benefiting from the treatment  Goal is to wear for at least 6 hours each night for maximal clinical benefit. Continue to work on weight loss, as the link between excess weight  and sleep apnea is well established.   Remember to establish a good bedtime routine, and work on sleep hygiene.  Limit daytime naps , avoid stimulants such as caffeine and nicotine close to bedtime, exercise daily to promote sleep quality, avoid heavy , spicy, fried , or rich foods before bed. Ensure adequate exposure to natural light during the day,establish a relaxing bedtime routine with a pleasant sleep environment ( Bedroom between 60 and 67 degrees, turn off bright lights , TV or device screens screens , consider black out curtains or white noise machines) Do not drive if sleepy. Remember to clean mask, tubing, filter, and reservoir once weekly with soapy water.  Follow up with Dr. Elsworth Soho   In 3 months  or before as needed.     Oral Thrush Plan I will send in some Mycelex troches for the irritation to your tongue.  Take three times daily for 10 days Call if this does not clear your thrush  Current Every Day smoker Plan Smoking cessation counseling done  Chantix started pack prescribed Follow up in 3 months or sooner as needed  I spent 35 minutes dedicated to the care of this patient on the date of this encounter to include pre-visit review of records, face-to-face time with the patient discussing conditions above, post visit ordering of testing, clinical documentation with the electronic health record, making appropriate referrals as documented, and communicating necessary information to the patient's healthcare team.   [name], NP 04/12/2022  11:27 PM

## 2022-04-14 DIAGNOSIS — J9611 Chronic respiratory failure with hypoxia: Secondary | ICD-10-CM | POA: Diagnosis not present

## 2022-04-14 DIAGNOSIS — J441 Chronic obstructive pulmonary disease with (acute) exacerbation: Secondary | ICD-10-CM | POA: Diagnosis not present

## 2022-04-14 DIAGNOSIS — J449 Chronic obstructive pulmonary disease, unspecified: Secondary | ICD-10-CM | POA: Diagnosis not present

## 2022-04-14 DIAGNOSIS — G4733 Obstructive sleep apnea (adult) (pediatric): Secondary | ICD-10-CM | POA: Diagnosis not present

## 2022-04-18 DIAGNOSIS — J449 Chronic obstructive pulmonary disease, unspecified: Secondary | ICD-10-CM | POA: Diagnosis not present

## 2022-04-18 DIAGNOSIS — Z7409 Other reduced mobility: Secondary | ICD-10-CM | POA: Diagnosis not present

## 2022-04-18 DIAGNOSIS — R0602 Shortness of breath: Secondary | ICD-10-CM | POA: Diagnosis not present

## 2022-04-18 DIAGNOSIS — Z515 Encounter for palliative care: Secondary | ICD-10-CM | POA: Diagnosis not present

## 2022-04-18 DIAGNOSIS — R059 Cough, unspecified: Secondary | ICD-10-CM | POA: Diagnosis not present

## 2022-04-18 DIAGNOSIS — Z720 Tobacco use: Secondary | ICD-10-CM | POA: Diagnosis not present

## 2022-04-18 DIAGNOSIS — F17210 Nicotine dependence, cigarettes, uncomplicated: Secondary | ICD-10-CM | POA: Diagnosis not present

## 2022-04-18 DIAGNOSIS — Z9981 Dependence on supplemental oxygen: Secondary | ICD-10-CM | POA: Diagnosis not present

## 2022-04-24 ENCOUNTER — Other Ambulatory Visit (HOSPITAL_COMMUNITY): Payer: Self-pay | Admitting: Psychiatry

## 2022-04-24 ENCOUNTER — Ambulatory Visit (INDEPENDENT_AMBULATORY_CARE_PROVIDER_SITE_OTHER): Payer: Medicare Other | Admitting: Licensed Clinical Social Worker

## 2022-04-24 DIAGNOSIS — F0630 Mood disorder due to known physiological condition, unspecified: Secondary | ICD-10-CM

## 2022-04-24 DIAGNOSIS — R443 Hallucinations, unspecified: Secondary | ICD-10-CM

## 2022-04-24 DIAGNOSIS — F411 Generalized anxiety disorder: Secondary | ICD-10-CM

## 2022-04-24 DIAGNOSIS — F4310 Post-traumatic stress disorder, unspecified: Secondary | ICD-10-CM | POA: Diagnosis not present
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
## 2022-05-02 ENCOUNTER — Ambulatory Visit
Admission: RE | Admit: 2022-05-02 | Discharge: 2022-05-02 | Disposition: A | Discharge status: Home / Self Care | Payer: Medicare Other | Source: Ambulatory Visit | Attending: Acute Care | Admitting: Acute Care

## 2022-05-02 DIAGNOSIS — F17210 Nicotine dependence, cigarettes, uncomplicated: Secondary | ICD-10-CM | POA: Diagnosis not present

## 2022-05-02 DIAGNOSIS — Z87891 Personal history of nicotine dependence: Secondary | ICD-10-CM | POA: Insufficient documentation

## 2022-05-03 ENCOUNTER — Telehealth: Payer: Self-pay | Admitting: Acute Care

## 2022-05-03 NOTE — Telephone Encounter (Signed)
Pt calling in bc Pt she dropped off paperwork yesterday and has a few more pages that went with the already dropped off paperwork, Pt will drop off remaining paperwork Monday

## 2022-05-03 NOTE — Telephone Encounter (Signed)
Routing to [place] as an [employer]

## 2022-05-03 NOTE — Telephone Encounter (Signed)
Routing to SG as an [place].

## 2022-05-06 ENCOUNTER — Other Ambulatory Visit: Payer: Self-pay | Admitting: Acute Care

## 2022-05-06 DIAGNOSIS — F17210 Nicotine dependence, cigarettes, uncomplicated: Secondary | ICD-10-CM

## 2022-05-06 DIAGNOSIS — M5412 Radiculopathy, cervical region: Secondary | ICD-10-CM | POA: Diagnosis not present

## 2022-05-06 DIAGNOSIS — Z122 Encounter for screening for malignant neoplasm of respiratory organs: Secondary | ICD-10-CM

## 2022-05-06 DIAGNOSIS — Z87891 Personal history of nicotine dependence: Secondary | ICD-10-CM

## 2022-05-06 NOTE — Telephone Encounter (Signed)
Joan [name] sending to you to check on paperwork.

## 2022-05-08 DIAGNOSIS — J441 Chronic obstructive pulmonary disease with (acute) exacerbation: Secondary | ICD-10-CM | POA: Diagnosis not present

## 2022-05-08 DIAGNOSIS — G4733 Obstructive sleep apnea (adult) (pediatric): Secondary | ICD-10-CM | POA: Diagnosis not present

## 2022-05-08 DIAGNOSIS — J449 Chronic obstructive pulmonary disease, unspecified: Secondary | ICD-10-CM | POA: Diagnosis not present

## 2022-05-09 ENCOUNTER — Ambulatory Visit (INDEPENDENT_AMBULATORY_CARE_PROVIDER_SITE_OTHER): Payer: Medicare Other | Admitting: Licensed Clinical Social Worker

## 2022-05-09 DIAGNOSIS — F411 Generalized anxiety disorder: Secondary | ICD-10-CM

## 2022-05-09 DIAGNOSIS — F0630 Mood disorder due to known physiological condition, unspecified: Secondary | ICD-10-CM

## 2022-05-09 DIAGNOSIS — R443 Hallucinations, unspecified: Secondary | ICD-10-CM

## 2022-05-09 DIAGNOSIS — F4310 Post-traumatic stress disorder, unspecified: Secondary | ICD-10-CM

## 2022-05-09 NOTE — Progress Notes (Signed)
Virtual Visit via Video Note  I connected with [name] on 05/09/22 at  2:00 PM EDT by a video enabled telemedicine application and verified that I am speaking with the correct person using two identifiers.  Location: Patient: outside home Provider: home office   I discussed the limitations of evaluation and management by telemedicine and the availability of in person appointments. The patient expressed understanding and agreed to proceed.   I discussed the assessment and treatment plan with the patient. The patient was provided an opportunity to ask questions and all were answered. The patient agreed with the plan and demonstrated an understanding of the instructions.   The patient was advised to call back or seek an in-person evaluation if the symptoms worsen or if the condition fails to improve as anticipated.  I provided 30 minutes of non-face-to-face time during this encounter.  THERAPIST PROGRESS NOTE  Session Time: 2:00 PM to 2:30 PM  Participation Level: Active  Behavioral Response: CasualAlertappropriate  Type of Therapy: Individual Therapy  Treatment Goals addressed: Work on trauma, anxiety, depression, coping  ProgressTowards Goals: Progressing-patient utilizing session to help cope with stressors  Interventions: Solution Focused, Strength-based, Supportive, and Other: Coping  Summary: Shawn Eisenstadt is a 62 y.o. female who presents with outside and therapist gave her positive feedback of how good that was for her right now particularly nice out.  Therapist shared her own experience of being outside and how uplifting it was therapist herself has a bird feeder patient says wants to get a hummingbird feeder. Going to spent the afternoon outside visits Dub Mikes met her when went to the mailbox. Also neighbor, Eddie Dibbles comes to visit he smokes too they became friends.  Therapist mentioned happiness comes from talking to strangers so in fact good coping strategy.   Therapist mention other things journaling helping others exercise.  Patient was not sure what journaling was about she thinks it is more in depth then daily activities write what you are thinking and feeling.  Therapist agrees and some researchers found it can be healing. Asked pastor at [place] study told him having a hard time reading and following the Bible at church and at class. Has it on phone may have to bring actual Bible because it is easier for her to read. An easy reading Bible. On phone tries to put in sentence so understands it. Problem has pocket book oxygen machine, coffee. Does have a bag that have fill up and pull. Needs to bring to church have to go up stairs by time get in seat need to breath if don't will start hyperventilate done that more than once and not fun grasping for air. Cigarette smoking doesn't help think Easter Sunday will be her quitting date.  Therapist enthusiastic about this plan patient says having a hard time with mind memory. Talked to Dr. De Nurse about this keep forgetting conversations hard to stay focused on what is being taking about. Taking medicine at the wrong time. Couldn't stay awake at church. Did it again on Tuesday at class. Was dozing and couldn't figure out why sleeping.Have them well delineated but still messes up. Having sharp pains in head CAT scan thought may have a stroke and heart attack but didn't find anything patient said something going on. Knows has to get out not isolate.  Although jokingly said brain tired and doesn't want to work. Patient got times wrong therapist noted on occasion getting things mixed up. Situation there maintenance went into her house. Supposedly to  fix hot water tank. Shower first part hot and first part cold still the same. Maybe reset button put on too soon when took shower. Went to look in purse things missing. The only one could have been in house since incident with sister is this maintenance man.  Therapist went to explore but  patient's phone shut off  Therapist provided space and support for patient to talk about thoughts and feelings in session helping her processing to help cope with stressors recent events.  Patient mention forgetfulness but has talked to doctors about it exploring source.  In session will note on 1 occasion got confused about topic.  Patient notes getting mixed up on occasion.  Therapist processing this to help her with insight to coping.  Noticed patient connecting socially that helps with mood therapist added helps with exercise for brain so positive she puts effort into it patient patient herself realizing good for her.  Therapist added other activities that help the brain would be good things like new learning exercise.  Patient has good coping with her engagement with the church community for her engage in a positive activity and a good resource for her spirituality her mental health.  Other positive is patient to know neighbors enjoying her neighborhood enjoy being outside all positive steps patient's taking.  Discussed journaling encouraged this that it could help work on issues be transformative patient has shown some interest in this.  Therapist provided active listening open questions supportive interventions. Suicidal/Homicidal: No  Plan: Return again in 2 weeks.2.Patient process thoughts and feelings to help with coping, work on trauma stressors where needed  Diagnosis: PTSD, hallucinations, GAD, Mood Disorder in conditions classified elsewhere  Collaboration of Care: Other none needed  Patient/Guardian was advised Release of Information must be obtained prior to any record release in order to collaborate their care with an outside provider. Patient/Guardian was advised if they have not already done so to contact the registration department to sign all necessary forms in order for [place] to release information regarding their care.   Consent: Patient/Guardian gives verbal consent for treatment  and assignment of benefits for services provided during this visit. Patient/Guardian expressed understanding and agreed to proceed.   [name], LCSW 05/09/2022

## 2022-05-10 NOTE — Telephone Encounter (Signed)
Patient checking on forms for Combivent to be sent to [facility]. Patient phone number is [phone].

## 2022-05-10 NOTE — Telephone Encounter (Signed)
I called the pt  The forms she dropped off were for patient assistance for combivent inhaler  They were nothing to do with disability forms, so I am unsure why this msg went to [place]  I am worried that the forms were taken to the wrong person  I checked up front and in B pod cabinet and forms are not there   [place], do you have any forms on this pt?

## 2022-05-13 DIAGNOSIS — G4733 Obstructive sleep apnea (adult) (pediatric): Secondary | ICD-10-CM | POA: Diagnosis not present

## 2022-05-13 DIAGNOSIS — J9611 Chronic respiratory failure with hypoxia: Secondary | ICD-10-CM | POA: Diagnosis not present

## 2022-05-13 DIAGNOSIS — J449 Chronic obstructive pulmonary disease, unspecified: Secondary | ICD-10-CM | POA: Diagnosis not present

## 2022-05-13 DIAGNOSIS — J441 Chronic obstructive pulmonary disease with (acute) exacerbation: Secondary | ICD-10-CM | POA: Diagnosis not present

## 2022-05-15 DIAGNOSIS — Z7951 Long term (current) use of inhaled steroids: Secondary | ICD-10-CM | POA: Diagnosis not present

## 2022-05-15 DIAGNOSIS — R9431 Abnormal electrocardiogram [ECG] [EKG]: Secondary | ICD-10-CM | POA: Diagnosis not present

## 2022-05-15 DIAGNOSIS — Z87891 Personal history of nicotine dependence: Secondary | ICD-10-CM | POA: Diagnosis not present

## 2022-05-15 DIAGNOSIS — R0602 Shortness of breath: Secondary | ICD-10-CM | POA: Diagnosis not present

## 2022-05-15 DIAGNOSIS — J439 Emphysema, unspecified: Secondary | ICD-10-CM | POA: Diagnosis not present

## 2022-05-15 DIAGNOSIS — Z881 Allergy status to other antibiotic agents status: Secondary | ICD-10-CM | POA: Diagnosis not present

## 2022-05-15 DIAGNOSIS — Z79899 Other long term (current) drug therapy: Secondary | ICD-10-CM | POA: Diagnosis not present

## 2022-05-15 DIAGNOSIS — R0789 Other chest pain: Secondary | ICD-10-CM | POA: Diagnosis not present

## 2022-05-15 DIAGNOSIS — R079 Chest pain, unspecified: Secondary | ICD-10-CM | POA: Diagnosis not present

## 2022-05-15 DIAGNOSIS — E1142 Type 2 diabetes mellitus with diabetic polyneuropathy: Secondary | ICD-10-CM | POA: Diagnosis not present

## 2022-05-15 DIAGNOSIS — R069 Unspecified abnormalities of breathing: Secondary | ICD-10-CM | POA: Diagnosis not present

## 2022-05-15 DIAGNOSIS — R6889 Other general symptoms and signs: Secondary | ICD-10-CM | POA: Diagnosis not present

## 2022-05-15 DIAGNOSIS — K219 Gastro-esophageal reflux disease without esophagitis: Secondary | ICD-10-CM | POA: Diagnosis not present

## 2022-05-15 DIAGNOSIS — J441 Chronic obstructive pulmonary disease with (acute) exacerbation: Secondary | ICD-10-CM | POA: Diagnosis not present

## 2022-05-15 DIAGNOSIS — R072 Precordial pain: Secondary | ICD-10-CM | POA: Diagnosis not present

## 2022-05-15 DIAGNOSIS — Z885 Allergy status to narcotic agent status: Secondary | ICD-10-CM | POA: Diagnosis not present

## 2022-05-15 DIAGNOSIS — E876 Hypokalemia: Secondary | ICD-10-CM | POA: Diagnosis not present

## 2022-05-15 DIAGNOSIS — I700 Atherosclerosis of aorta: Secondary | ICD-10-CM | POA: Diagnosis not present

## 2022-05-15 DIAGNOSIS — Z743 Need for continuous supervision: Secondary | ICD-10-CM | POA: Diagnosis not present

## 2022-05-17 ENCOUNTER — Ambulatory Visit: Payer: Medicare Other | Attending: Adult Health | Admitting: Adult Health

## 2022-05-17 ENCOUNTER — Telehealth: Payer: Self-pay

## 2022-05-17 ENCOUNTER — Encounter: Payer: Self-pay | Admitting: Adult Health

## 2022-05-17 VITALS — BP 102/64 | HR 69 | Ht 68.0 in | Wt 197.4 lb

## 2022-05-17 DIAGNOSIS — I6521 Occlusion and stenosis of right carotid artery: Secondary | ICD-10-CM

## 2022-05-17 DIAGNOSIS — F4310 Post-traumatic stress disorder, unspecified: Secondary | ICD-10-CM

## 2022-05-17 DIAGNOSIS — J439 Emphysema, unspecified: Secondary | ICD-10-CM

## 2022-05-17 DIAGNOSIS — R0789 Other chest pain: Secondary | ICD-10-CM

## 2022-05-17 DIAGNOSIS — I2510 Atherosclerotic heart disease of native coronary artery without angina pectoris: Secondary | ICD-10-CM

## 2022-05-17 DIAGNOSIS — Z720 Tobacco use: Secondary | ICD-10-CM | POA: Diagnosis not present

## 2022-05-17 DIAGNOSIS — E7800 Pure hypercholesterolemia, unspecified: Secondary | ICD-10-CM

## 2022-05-17 MED ORDER — RANOLAZINE ER 500 MG PO TB12: 500.0000 mg | ORAL_TABLET | Freq: Two times a day (BID) | ORAL | 6 refills | Status: DC

## 2022-05-17 NOTE — Patient Instructions (Signed)
Medication Instructions:  Start Ranexa 500 mg ( Take 1 Table Twice Daily). *If you need a refill on your cardiac medications before your next appointment, please call your pharmacy*   Lab Work: No Labs If you have labs (blood work) drawn today and your tests are completely normal, you will receive your results only by: [place] (if you have MyChart) OR A paper copy in the mail If you have any lab test that is abnormal or we need to change your treatment, we will call you to review the results.   Testing/Procedures: No Testing   Follow-Up: At [facility], you and your health needs are our priority.  As part of our continuing mission to provide you with exceptional heart care, we have created designated Provider Care Teams.  These Care Teams include your primary Cardiologist (physician) and Advanced Practice Providers (APPs -  Physician Assistants and Nurse Practitioners) who all work together to provide you with the care you need, when you need it.  We recommend signing up for the patient portal called "MyChart".  Sign up information is provided on this After Visit Summary.  MyChart is used to connect with patients for Virtual Visits (Telemedicine).  Patients are able to view lab/test results, encounter notes, upcoming appointments, etc.  Non-urgent messages can be sent to your provider as well.   To learn more about what you can do with MyChart, go to [email or web address].    Your next appointment:   6 month(s)  Provider:   Quay Burow, MD

## 2022-05-17 NOTE — Transitions of Care (Post Inpatient/ED Visit) (Signed)
   05/17/2022  Name: Joan [name] MRN: [id number] DOB: 12-26-1960  Today's TOC FU Call Status: Today's TOC FU Call Status:: Successful TOC FU Call Competed TOC FU Call Complete Date: 05/17/22  Transition Care Management Follow-up Telephone Call Date of Discharge: 05/16/22 Discharge Facility: Other ([place]) Name of Other ([place]) Discharge Facility: [facility] Type of Discharge: Emergency Department Reason for ED Visit: Other: (chest pain)  Items Reviewed:    Home Care and Equipment/Supplies:    Functional Questionnaire:    Follow up appointments reviewed:  Patient in [place] now    [place], [place] Direct Dial [phone]

## 2022-05-17 NOTE — Telephone Encounter (Signed)
Working with Judson Roch today.  In [place] box up front, papers in envelope for Angelina Ok, [place] Patient Assistance Program.  Per patient request on envelope, "please fill in the date on all paperwork".  Dated all forms for 05/17/2022.  Filled out Section 5-6, Prescriber Information and Prescription & Medication Information.  [name] signed this section (page).  Attached list of patients current medications/drug allergies.  Faxed all forms to:  [place] Patient Assistance Program Fax:  [phone] Phone:  [phone]  Will put original forms in [name]'s cabinet in B pod if needed.

## 2022-05-17 NOTE — Progress Notes (Signed)
Cardiology Clinic Note   Patient Name: Joan [name] Date of Encounter: 05/17/2022  Primary Care Provider:  [name], MD Primary Cardiologist:  Joan Burow, MD  Patient Profile   62 y.o. y/o female with a h/o COPD, anxiety, tobacco abuse, HTN, PTSD, chronic back pain who is pending cervical fusion seen in the ED on 05/15/2022 for recurrent chest discomfort.  Found to be hypokalemic with potassium 3.0.  She was seen at [facility] ED.  This was repleted and she is here for follow-up.  Past Medical History    Past Medical History:  Diagnosis Date   Acute respiratory failure with hypoxia ([place])    Alcoholism and drug addiction in family    Anxiety    Appendicitis    Arthritis    "knees, hips, lower back" (09/25/2016)   Asthma    Bipolar disorder ([place])    Chronic bronchitis ([place])    "all the time" (09/25/2016)   Chronic leg pain    RLE   Chronic lower back pain    COPD (chronic obstructive pulmonary disease) ([place])    Depression    Diabetes mellitus without complication (HCC)    Diabetic neuropathy ([place])    Early cataracts, bilateral    Emphysema of lung ([place])    Generalized anxiety disorder 02/09/2020   GERD (gastroesophageal reflux disease)    Headache    Hepatitis C    "treated back in 2001-2002"   High cholesterol    Hypertension    Hypothyroidism    Incarcerated ventral hernia 04/04/2017   Mood disorder in conditions classified elsewhere 02/09/2020   On home oxygen therapy    "2L; at nighttime" (04/04/2017)   OSA on CPAP    CPAP with Oxygen 3 liters   Paranoid ([place])    Pneumonia    "all the time" (09/25/2016)   Positive PPD    with negative chest x ray   PTSD (post-traumatic stress disorder) 02/09/2020   Tachycardia    patient reports with exertion ; denies any other conditions    Thyroid disease    Wears glasses    Wears partial dentures    Past Surgical History:  Procedure Laterality Date   ANTERIOR CERVICAL DECOMP/DISCECTOMY FUSION  N/A 02/14/2022   Procedure: Anterior Cervical Decompression/Discectomy Fusion Cervical Three-Cervical Four;  Surgeon: Joan Mare, MD;  Location: [place];  Service: Neurosurgery;  Laterality: N/A;  3C   APPENDECTOMY     BACK SURGERY     BIOPSY  11/24/2018   Procedure: BIOPSY;  Surgeon: Joan Park, MD;  Location: [facility];  Service: Gastroenterology;;   CARDIAC CATHETERIZATION     COLONOSCOPY     COLONOSCOPY WITH PROPOFOL N/A 11/24/2018   Procedure: COLONOSCOPY WITH PROPOFOL;  Surgeon: Joan Park, MD;  Location: [facility];  Service: Gastroenterology;  Laterality: N/A;   CYST EXCISION     removal of cyst in sinuses   DILATION AND CURETTAGE OF UTERUS     ESOPHAGOGASTRODUODENOSCOPY (EGD) WITH PROPOFOL N/A 11/24/2018   Procedure: ESOPHAGOGASTRODUODENOSCOPY (EGD) WITH PROPOFOL;  Surgeon: Joan Park, MD;  [facility];  Service: Gastroenterology;  Laterality: N/A;   FOOT SURGERY Bilateral    bone removed from 4th and 5 th toes and put in pin then took pin out   [place]  ~ 2014/2015   "removed mesh & infection"   [place] N/A 04/04/2017   Procedure: LAPAROSCOPIC INCISIONAL HERNIA REPAIR WITH MESH;  Surgeon: Joan Skates,  MD;  Location: [place];  Service: General;  Laterality: N/A;   LACERATION REPAIR Right    repair wrist artery from laceration from ice skate   LAPAROSCOPIC APPENDECTOMY N/A 03/31/2019   Procedure: APPENDECTOMY LAPAROSCOPIC;  Surgeon: Joan Ok, MD;  Location: [facility];  Service: General;  Laterality: N/A;   LAPAROSCOPIC APPENDECTOMY N/A 07/09/2019   Procedure: APPENDECTOMY LAPAROSCOPIC;  Surgeon: Joan Ok, MD;  Location: [place];  Service: General;  Laterality: N/A;   LAPAROSCOPIC INCISIONAL / UMBILICAL / [place]  04/04/2017   LAPAROSCOPIC INCISIONAL HERNIA REPAIR WITH MESH   LIPOMA EXCISION Right    fattty lipoma in neck   NASAL SEPTUM SURGERY     POSTERIOR  LUMBAR FUSION  2015/2016 X 2   "added rods and screws"   SINOSCOPY     TOTAL HIP ARTHROPLASTY Right 02/05/2017   Procedure: RIGHT TOTAL HIP ARTHROPLASTY;  Surgeon: Joan Maudlin, MD;  [facility]: [facility];  Service: Orthopedics;  Laterality: Right;   TOTAL HIP ARTHROPLASTY Left 06/18/2017   Procedure: LEFT TOTAL HIP ARTHROPLASTY;  Surgeon: Joan Maudlin, MD;  [facility];  Service: Orthopedics;  Laterality: Left;   TOTAL HIP REVISION Left 01/26/2019   Procedure: TOTAL HIP REVISION;  Surgeon: Joan Cancel, MD;  [facility];  Service: Orthopedics;  Laterality: Left;  2 hrs   TUBAL LIGATION     UMBILICAL HERNIA REPAIR  ~ 2013   w/mesh   UPPER GI ENDOSCOPY      Allergies  Allergies  Allergen Reactions   Keflex [Cephalexin] Rash and Other (See Comments)    Has tolerated amoxicillin since had keflex   Shellfish Allergy Shortness Of Breath, Nausea And Vomiting and Other (See Comments)    Stomach cramps   Tetracyclines & Related Anaphylaxis, Swelling and Other (See Comments)    Throat swelling requiring hospitalization   Dilaudid [Hydromorphone Hcl] Other (See Comments)    "Lethargy"   Prednisone Other (See Comments)    "counter reacts", has tolerated IV medrol   Clindamycin/Lincomycin Other (See Comments)    Burning sensation in her mouth down her throat    Incruse Ellipta [Umeclidinium Bromide] Nausea Only   Molds & Smuts Itching   Tuberculin Tests Other (See Comments)    False postive   Umeclidinium     Other Reaction(s): GI Intolerance    History of Present Illness  [name] comes today with multiple somatic complaints since being seen in the ED at [facility].  She insists on having a repeat cardiac catheterization as she is having recurrent chest pain, she is now on oxygen via nasal cannula at 3.5 L for COPD, she has chronic pain and has been taking oxycodone as well as Valium and complains of being sleepy and lightheaded and wishes to lie down while here in the  office.  She also reports that she has been admitted to hospice care but continues to want to have "everything done" until her children come to see her in the hospital and make the decisions.  She is insistent that the chest discomfort she experiences is related to cardiovascular disease and pending MI.  Unfortunately she continues to smoke cigarettes.  Home Medications    Current Outpatient Medications  Medication Sig Dispense Refill   Accu-Chek Softclix Lancets lancets Use as instructed 100 each 12   acyclovir (ZOVIRAX) 400 MG tablet TAKE 1 TABLET BY MOUTH  TWICE DAILY (Patient taking differently: Take 400 mg by mouth in the morning.) 180 tablet 3   albuterol (  PROVENTIL) (2.5 MG/3ML) 0.083% nebulizer solution Take 3 mLs (2.5 mg total) by nebulization 2 (two) times daily. And every 6 hours as needed.  DX: J44.9 360 mL 3   atenolol (TENORMIN) 25 MG tablet TAKE 1 TABLET BY MOUTH  DAILY 90 tablet 3   atorvastatin (LIPITOR) 20 MG tablet TAKE 1 TABLET BY MOUTH ONCE  DAILY 100 tablet 2   blood glucose meter kit and supplies Dispense based on patient and insurance preference. Use up to two times daily as directed. (FOR ICD-10 E10.9, E11.9). 1 each 0   Budeson-Glycopyrrol-Formoterol (BREZTRI AEROSPHERE) 160-9-4.8 MCG/ACT AERO Inhale 2 puffs into the lungs in the morning and at bedtime. 10.7 g 0   buPROPion (WELLBUTRIN XL) 300 MG 24 hr tablet Take 1 tablet (300 mg total) by mouth daily. 90 tablet 0   busPIRone (BUSPAR) 5 MG tablet Take 1 tablet (5 mg total) by mouth at bedtime. 30 tablet 0   Camphor-Menthol-Methyl Sal (SALONPAS) 3.03-02-08 % PTCH Place 1 patch onto the skin daily as needed (neck pain).     cyclobenzaprine (FLEXERIL) 10 MG tablet TAKE 1 TABLET BY MOUTH AT  BEDTIME 90 tablet 1   diazepam (VALIUM) 5 MG tablet Take 1 tablet (5 mg total) by mouth every 8 (eight) hours as needed for anxiety. 21 tablet 0   diphenhydrAMINE (BENADRYL) 25 mg capsule Take 25 mg by mouth 2 (two) times daily.      glucose blood test strip Use as instructed 100 each 12   hydrochlorothiazide (HYDRODIURIL) 25 MG tablet TAKE 1 TABLET BY MOUTH DAILY 100 tablet 2   Ipratropium-Albuterol (COMBIVENT RESPIMAT) 20-100 MCG/ACT AERS respimat Inhale 1 puff into the lungs every 6 (six) hours as needed for wheezing or shortness of breath. 12 g 3   levothyroxine (SYNTHROID) 25 MCG tablet TAKE 1 TABLET BY MOUTH DAILY  BEFORE BREAKFAST 100 tablet 2   MELATONIN PO Take 1 tablet by mouth at bedtime as needed (sleep).     montelukast (SINGULAIR) 10 MG tablet TAKE 1 TABLET BY MOUTH AT  BEDTIME 100 tablet 2   naproxen (NAPROSYN) 500 MG tablet Take 1 tablet (500 mg total) by mouth 2 (two) times daily with a meal. 180 tablet 3   nystatin-triamcinolone ointment (MYCOLOG) Apply 1 application topically 2 (two) times daily as needed (applied to skin folds/groins/under breast skin irritation rash.). 100 g 3   oxyCODONE-acetaminophen (PERCOCET) 5-325 MG tablet Take 1-2 tablets by mouth every 6 (six) hours as needed for severe pain. 50 tablet 0   OXYGEN Inhale 3 L/min into the lungs See admin instructions. Inhale  3 L/min of oxygen into the lungs w/CPAP at bedtime and as needed for shortness of breath with "strenuous activity" during the day     pantoprazole (PROTONIX) 40 MG tablet TAKE 1 TABLET BY MOUTH DAILY 100 tablet 3   PATADAY 0.2 % SOLN Place 1 drop into both eyes daily.     polyethylene glycol (MIRALAX / GLYCOLAX) 17 g packet Take 17 g by mouth daily as needed for mild constipation. 28 packet 0   pregabalin (LYRICA) 300 MG capsule Take 1 capsule (300 mg total) by mouth 2 (two) times daily. 60 capsule 3   QUEtiapine (SEROQUEL) 100 MG tablet TAKE 1 TABLET BY MOUTH EVERY  NIGHT AT BEDTIME 30 tablet 0   ranolazine (RANEXA) 500 MG 12 hr tablet Take 1 tablet (500 mg total) by mouth 2 (two) times daily. 60 tablet 6   roflumilast (DALIRESP) 500 MCG TABS tablet Take 1  tablet (500 mcg total) by mouth daily. 90 tablet 3   traZODone (DESYREL)  100 MG tablet TAKE 1 TABLET BY MOUTH AT  BEDTIME 100 tablet 3   Varenicline Tartrate, Starter, (CHANTIX STARTING MONTH PAK) 0.5 MG X 11 & 1 MG X 42 TBPK Chantix started pack Take 0.5 mg once daily x 3 days, then take 0.5 mg twice daily x 4 days, then take 1 mg twice daily thereafter. 1 each 1   venlafaxine XR (EFFEXOR-XR) 150 MG 24 hr capsule TAKE 1 CAPSULE BY MOUTH DAILY  WITH BREAKFAST 100 capsule 3   witch hazel-glycerin (TUCKS) pad Apply topically as needed for itching.     cetirizine (ZYRTEC) 10 MG tablet Take 1 tablet (10 mg total) by mouth 2 (two) times daily. (Patient taking differently: Take 10 mg by mouth daily.) 180 tablet 3   fluticasone (FLONASE) 50 MCG/ACT nasal spray Place 2 sprays into both nostrils daily. (Patient taking differently: Place 2 sprays into both nostrils daily as needed for allergies.) 48 g 1   No current facility-administered medications for this visit.     Family History    Family History  Problem Relation Age of Onset   Diabetes Mother    Hypertension Mother    Hypertension Father    Cancer Father        lung   Breast cancer Neg Hx    She indicated that her mother is deceased. She indicated that her father is deceased. She indicated that the status of her neg hx is unknown.  Social History    Social History   Socioeconomic History   Marital status: Widowed    Spouse name: Not on file   Number of children: 3   Years of education: Not on file   Highest education level: Not on file  Occupational History   Not on file  Tobacco Use   Smoking status: Every Day    Packs/day: 1.00    Years: 44.00    Additional pack years: 0.00    Total pack years: 44.00    Types: Cigarettes    Passive exposure: Current   Smokeless tobacco: Never   Tobacco comments:    1 cig per day 01/29/22  Vaping Use   Vaping Use: Never used  Substance and Sexual Activity   Alcohol use: Not Currently   Drug use: Not Currently    Types: "Crack" cocaine    Comment:  "nothing  since 01/12/2012"   Sexual activity: Not Currently    Partners: Male  Other Topics Concern   Not on file  Social History Narrative   Not on file   Social Determinants of Health   Financial Resource Strain: High Risk (05/05/2020)   Overall Financial Resource Strain (CARDIA)    Difficulty of Paying Living Expenses: Hard  Food Insecurity: No Food Insecurity (11/15/2021)   Hunger Vital Sign    Worried About Running Out of Food in the Last Year: Never true    [place] in the Last Year: Never true  Recent Concern: Food Insecurity - Food Insecurity Present (08/27/2021)   Hunger Vital Sign    Worried About Running Out of Food in the Last Year: Sometimes true    Ran Out of Food in the Last Year: Never true  Transportation Needs: No Transportation Needs (11/15/2021)   PRAPARE - Hydrologist (Medical): No    Lack of Transportation (Non-Medical): No  Physical Activity: Inactive (08/27/2021)   Exercise Vital  Sign    Days of Exercise per Week: 0 days    Minutes of Exercise per Session: 0 min  Stress: Stress Concern Present (12/26/2020)   [place]    Feeling of Stress : Very much  Social Connections: Socially Isolated (08/27/2021)   Social Connection and Isolation Panel [NHANES]    Frequency of Communication with Friends and Family: More than three times a week    Frequency of Social Gatherings with Friends and Family: Never    Attends Religious Services: Never    Marine scientist or Organizations: No    Attends Archivist Meetings: Never    Marital Status: Widowed  Intimate Partner Violence: Not At Risk (08/27/2021)   Humiliation, Afraid, Rape, and Kick questionnaire    Fear of Current or Ex-Partner: No    Emotionally Abused: No    Physically Abused: No    Sexually Abused: No     Review of Systems    General:  No chills, fever, night sweats or weight changes.   Cardiovascular: Chronic chest pain, chronic dyspnea on exertion, edema, orthopnea, palpitations, paroxysmal nocturnal dyspnea. Dermatological: No rash, lesions/masses Respiratory: No cough, worsening dyspnea Urologic: No hematuria, dysuria Abdominal:   No nausea, vomiting, diarrhea, bright red blood per rectum, melena, or hematemesis Neurologic:  No visual changes, wkns, changes in mental status.  Sleepy all the time. All other systems reviewed and are otherwise negative except as noted above.     Physical Exam    VS:  BP 102/64   Pulse 69   Ht 5\' 8"  (1.727 m)   Wt 197 lb 6.4 oz (89.5 kg)   SpO2 90%   BMI 30.01 kg/m  , BMI Body mass index is 30.01 kg/m.     GEN: Well nourished, well developed, multiple complaints, sitting in a wheelchair HEENT: normal. Neck: Supple, no JVD, carotid bruits, or masses. Cardiac: RRR, no murmurs, rubs, or gallops. No clubbing, cyanosis, edema.  Radials/DP/PT 2+ and equal bilaterally.  Respiratory: Bilateral inspiratory wheezes, expiratory crackles along with bibasilar crackles, wearing oxygen via nasal cannula at 3.5 L regular and unlabored, clear to auscultation bilaterally. GI: Soft, nontender, nondistended, BS + x 4. MS: no deformity or atrophy. Skin: warm and dry, no rash. Neuro:  Strength and sensation are intact. Psych: Difficult to pin down concerning focusing on subject matter, flight of ideas, mild confusion, lengthy discussion of multiple symptoms  Accessory Clinical Findings    ECG personally reviewed by me today-not repeated in the office today.  Lab Results  Component Value Date   WBC 20.4 (H) 02/08/2022   HGB 12.7 02/08/2022   HCT 38.3 02/08/2022   MCV 88.9 02/08/2022   PLT 266 02/08/2022   Lab Results  Component Value Date   CREATININE 1.04 01/29/2022   BUN 19 01/29/2022   NA 135 01/29/2022   K 3.4 (L) 01/29/2022   CL 94 (L) 01/29/2022   CO2 31 01/29/2022   Lab Results  Component Value Date   ALT 16 01/29/2022    AST 15 01/29/2022   ALKPHOS 129 (H) 01/29/2022   BILITOT 0.5 01/29/2022   Lab Results  Component Value Date   CHOL 140 06/11/2021   HDL 51.10 06/11/2021   LDLCALC 69 06/11/2021   LDLDIRECT 56.0 12/17/2018   TRIG 98.0 06/11/2021   CHOLHDL 3 06/11/2021    Lab Results  Component Value Date   HGBA1C 7.0 (H) 02/08/2022    Review of Prior  Studies: Cardiac CTA 01/05/2019 1. Left Main:  No significant stenosis.  FFRct 0.97   2. LAD: No significant stenosis. FFRct 0.93 proximal, 0.93 mid, 0.91 distal 3. LCX: No significant stenosis.  FFRct 0.97 proximal, 0.92 distal 4. RCA: No significant stenosis.  FFRct 0.99 proximal, 0.94 distal    Echocardiogram 08/11/2020 IMPRESSION: 1.  CT FFR analysis didn't show any significant stenosis.  1. Left ventricular ejection fraction, by estimation, is 60 to 65%. The  left ventricle has normal function. The left ventricle has no regional  wall motion abnormalities. Left ventricular diastolic parameters were  normal.   2. Right ventricular systolic function is normal. The right ventricular  size is normal.   3. The mitral valve is grossly normal. No evidence of mitral valve  regurgitation.   4. The aortic valve is grossly normal. Aortic valve regurgitation is not  visualized. No aortic stenosis is present.   Assessment & Plan   1.  Chronic chest pain: Frequent ER visits for same.  Most recently seen at [facility] 3 days ago.  She was found to be hypokalemic which was repleted.  Cardiac CTA on 01/05/2019 did not reveal significant stenosis.  No planned invasive cardiac testing.  Provided Ranexa 500 mg twice daily for chronic pain although this may not be as helpful to her as she requests.  No narcotics will be provided by cardiology.  Continue atenolol 25 mg daily for anginal symptoms.  2.  COPD: Oxygen dependent, worsening symptoms, frequent admissions for chest pain and exacerbations.  She reports that she is now on hospice due to worsening  COPD.  Unfortunately she continues to smoke.  Despite being on hospice she does want to have intubation until her children arrive to make the decision to take her off the ventilator.  Defer to hospice for DNR status, medications, and pain control.  3.  Chronic pain syndrome: On narcotics and antianxiety medications.  Defer to PCP and/or hospice.  4.  Hypercholesterolemia: Remains on atorvastatin 20 mg daily.  Follow-up per PCP.     5.  Ongoing tobacco abuse: Recalcitrant to cessation.  Signed, Phill Myron. West Pugh, ANP, [place]   05/17/2022 5:04 PM      Office [phone] Fax [phone]  Notice: This dictation was prepared with Dragon dictation along with smaller phrase technology. Any transcriptional errors that result from this process are unintentional and may not be corrected upon review.

## 2022-05-22 ENCOUNTER — Other Ambulatory Visit (HOSPITAL_COMMUNITY): Payer: Self-pay | Admitting: Psychiatry

## 2022-05-23 ENCOUNTER — Ambulatory Visit (INDEPENDENT_AMBULATORY_CARE_PROVIDER_SITE_OTHER): Payer: Medicare Other | Admitting: Licensed Clinical Social Worker

## 2022-05-23 ENCOUNTER — Encounter (HOSPITAL_COMMUNITY): Payer: Self-pay

## 2022-05-23 DIAGNOSIS — F4310 Post-traumatic stress disorder, unspecified: Secondary | ICD-10-CM

## 2022-05-23 DIAGNOSIS — F0630 Mood disorder due to known physiological condition, unspecified: Secondary | ICD-10-CM

## 2022-05-23 DIAGNOSIS — F411 Generalized anxiety disorder: Secondary | ICD-10-CM

## 2022-05-23 DIAGNOSIS — R443 Hallucinations, unspecified: Secondary | ICD-10-CM

## 2022-05-23 NOTE — Progress Notes (Signed)
Contacted patient through My chart and she did not respond.

## 2022-05-27 ENCOUNTER — Encounter: Payer: Medicare Other | Admitting: Internal Medicine

## 2022-06-04 ENCOUNTER — Other Ambulatory Visit (HOSPITAL_COMMUNITY): Payer: Self-pay | Admitting: Psychiatry

## 2022-06-06 ENCOUNTER — Ambulatory Visit (HOSPITAL_COMMUNITY): Payer: Medicare Other | Admitting: Licensed Clinical Social Worker

## 2022-06-12 NOTE — Telephone Encounter (Signed)
Pt called the office stating that [employer] was needing a date put on page 3 of the paperwork as the date was missing.  Form was dated and has been faxed to [facility] for pt and received confirmation. Called and spoke with pt letting her know this was done and she verbalized understanding. Nothing further needed.

## 2022-06-17 ENCOUNTER — Telehealth (INDEPENDENT_AMBULATORY_CARE_PROVIDER_SITE_OTHER): Payer: Medicare Other | Admitting: Psychiatry

## 2022-06-17 ENCOUNTER — Encounter (HOSPITAL_COMMUNITY): Payer: Self-pay | Admitting: Psychiatry

## 2022-06-17 DIAGNOSIS — R443 Hallucinations, unspecified: Secondary | ICD-10-CM

## 2022-06-17 DIAGNOSIS — F4310 Post-traumatic stress disorder, unspecified: Secondary | ICD-10-CM

## 2022-06-17 DIAGNOSIS — F0630 Mood disorder due to known physiological condition, unspecified: Secondary | ICD-10-CM

## 2022-06-17 DIAGNOSIS — F411 Generalized anxiety disorder: Secondary | ICD-10-CM

## 2022-06-17 MED ORDER — BUSPIRONE HCL 5 MG PO TABS: 5.0000 mg | ORAL_TABLET | Freq: Every day | ORAL | 0 refills | Status: DC

## 2022-06-17 MED ORDER — QUETIAPINE FUMARATE 100 MG PO TABS: 100.0000 mg | ORAL_TABLET | Freq: Every day | ORAL | 0 refills | Status: DC

## 2022-06-17 NOTE — Progress Notes (Signed)
[facility] Follow up visit  Patient Identification: Joan [name] MRN:  [id number] Date of Evaluation:  06/17/2022 Referral Source:Therapist [place]ze Chief Complaint:    depression follow up  Visit Diagnosis:    ICD-10-CM   1. Mood disorder in conditions classified elsewhere  F06.30     2. PTSD (post-traumatic stress disorder)  F43.10     3. GAD (generalized anxiety disorder)  F41.1     4. Hallucinations  R44.3       Virtual Visit via Video Note  I connected with Joan [name] on 06/17/22 at  3:00 PM EDT by a video enabled telemedicine application and verified that I am speaking with the correct person using two identifiers.  Location: Patient: home Provider: home office   I discussed the limitations of evaluation and management by telemedicine and the availability of in person appointments. The patient expressed understanding and agreed to proceed.     I discussed the assessment and treatment plan with the patient. The patient was provided an opportunity to ask questions and all were answered. The patient agreed with the plan and demonstrated an understanding of the instructions.   The patient was advised to call back or seek an in-person evaluation if the symptoms worsen or if the condition fails to improve as anticipated.  I provided 15 -20 minutes of non-face-to-face time during this encounter.     History of Present Illness: Patient is a 62 years old currently widow Caucasian female initially referred by her therapist for management of mood symptoms, depression possible PTSD and anxiety  Patient has been in rehab after being admitted for COPD exacerbation Has her own apartment, stays home, unfortunately still smokes  Gets somewhat paranoid about sister and people in church, also wants t to change therapist  Seroquel has been increased to  helps sleep and hallucination   Gets lonely   Aggravating factor; difficult childhood, grief,difficult  relationship with middle kid, hip replacement medical comorbidity,  lonliness Modifying factor: brother, family  Duration adult life Severity ; some paranoia    Past Psychiatric History: depression, drug use  Previous Psychotropic Medications: Yes   Substance Abuse History in the last 12 months:  No.  Consequences of Substance Abuse: history of extensive drug use uptil 8 years ago and have been in rehab programs  Past Medical History:  Past Medical History:  Diagnosis Date   Acute respiratory failure with hypoxia    Alcoholism and drug addiction in family    Anxiety    Appendicitis    Arthritis    "knees, hips, lower back" (09/25/2016)   Asthma    Bipolar disorder    Chronic bronchitis    "all the time" (09/25/2016)   Chronic leg pain    RLE   Chronic lower back pain    COPD (chronic obstructive pulmonary disease)    Depression    Diabetes mellitus without complication    Diabetic neuropathy    Early cataracts, bilateral    Emphysema of lung    Generalized anxiety disorder 02/09/2020   GERD (gastroesophageal reflux disease)    Headache    Hepatitis C    "treated back in 2001-2002"   High cholesterol    Hypertension    Hypothyroidism    Incarcerated ventral hernia 04/04/2017   Mood disorder in conditions classified elsewhere 02/09/2020   On home oxygen therapy    "2L; at nighttime" (04/04/2017)   OSA on CPAP    CPAP with Oxygen 3 liters   Paranoid  Pneumonia    "all the time" (09/25/2016)   Positive PPD    with negative chest x ray   PTSD (post-traumatic stress disorder) 02/09/2020   Tachycardia    patient reports with exertion ; denies any other conditions    Thyroid disease    Wears glasses    Wears partial dentures     Past Surgical History:  Procedure Laterality Date   ANTERIOR CERVICAL DECOMP/DISCECTOMY FUSION N/A 02/14/2022   Procedure: Anterior Cervical Decompression/Discectomy Fusion Cervical Three-Cervical Four;  Surgeon: Bedelia Person, MD;   Location: [facility] OR;  Service: Neurosurgery;  Laterality: N/A;  3C   APPENDECTOMY     BACK SURGERY     BIOPSY  11/24/2018   Procedure: BIOPSY;  Surgeon: Tressia Danas, MD;  Location: [facility];  Service: Gastroenterology;;   CARDIAC CATHETERIZATION     COLONOSCOPY     COLONOSCOPY WITH PROPOFOL N/A 11/24/2018   Procedure: COLONOSCOPY WITH PROPOFOL;  Surgeon: Tressia Danas, MD;  Location: [facility];  Service: Gastroenterology;  Laterality: N/A;   CYST EXCISION     removal of cyst in sinuses   DILATION AND CURETTAGE OF UTERUS     ESOPHAGOGASTRODUODENOSCOPY (EGD) WITH PROPOFOL N/A 11/24/2018   Procedure: ESOPHAGOGASTRODUODENOSCOPY (EGD) WITH PROPOFOL;  Surgeon: Tressia Danas, MD;  Location: [facility];  Service: Gastroenterology;  Laterality: N/A;   FOOT SURGERY Bilateral    bone removed from 4th and 5 th toes and put in pin then took pin out   HERNIA REPAIR     INCISION AND DRAINAGE  ~ 2014/2015   "removed mesh & infection"   INCISIONAL HERNIA REPAIR N/A 04/04/2017   Procedure: LAPAROSCOPIC INCISIONAL HERNIA REPAIR WITH MESH;  Surgeon: Claud Kelp, MD;  Location: [facility] OR;  Service: General;  Laterality: N/A;   LACERATION REPAIR Right    repair wrist artery from laceration from ice skate   LAPAROSCOPIC APPENDECTOMY N/A 03/31/2019   Procedure: APPENDECTOMY LAPAROSCOPIC;  Surgeon: Axel Filler, MD;  Location: [facility];  Service: General;  Laterality: N/A;   LAPAROSCOPIC APPENDECTOMY N/A 07/09/2019   Procedure: APPENDECTOMY LAPAROSCOPIC;  Surgeon: Axel Filler, MD;  Location[facility] OR;  Service: General;  Laterality: N/A;   LAPAROSCOPIC INCISIONAL / UMBILICAL / VENTRAL HERNIA REPAIR  04/04/2017   LAPAROSCOPIC INCISIONAL HERNIA REPAIR WITH MESH   LIPOMA EXCISION Right    fattty lipoma in neck   NASAL SEPTUM SURGERY     POSTERIOR LUMBAR FUSION  2015/2016 X 2   "added rods and screws"   SINOSCOPY     TOTAL HIP ARTHROPLASTY Right 02/05/2017   Procedure: RIGHT TOTAL HIP  ARTHROPLASTY;  Surgeon: Ranee Gosselin, MD;  Location: [facility];  Service: Orthopedics;  Laterality: Right;   TOTAL HIP ARTHROPLASTY Left 06/18/2017   Procedure: LEFT TOTAL HIP ARTHROPLASTY;  Surgeon: Ranee Gosselin, MD;  Location: [facility];  Service: Orthopedics;  Laterality: Left;   TOTAL HIP REVISION Left 01/26/2019   Procedure: TOTAL HIP REVISION;  Surgeon: Durene Romans, MD;  Location: [facility];  Service: Orthopedics;  Laterality: Left;  2 hrs   TUBAL LIGATION     UMBILICAL HERNIA REPAIR  ~ 2013   w/mesh   UPPER GI ENDOSCOPY      Family Psychiatric History: FAther : alcohol use  Family History:  Family History  Problem Relation Age of Onset   Diabetes Mother    Hypertension Mother    Hypertension Father    Cancer Father        lung   Breast cancer Neg Hx  Social History:   Social History   Socioeconomic History   Marital status: Widowed    Spouse name: Not on file   Number of children: 3   Years of education: Not on file   Highest education level: Not on file  Occupational History   Not on file  Tobacco Use   Smoking status: Every Day    Packs/day: 1.00    Years: 44.00    Additional pack years: 0.00    Total pack years: 44.00    Types: Cigarettes    Passive exposure: Current   Smokeless tobacco: Never   Tobacco comments:    1 cig per day 01/29/22  Vaping Use   Vaping Use: Never used  Substance and Sexual Activity   Alcohol use: Not Currently   Drug use: Not Currently    Types: "Crack" cocaine    Comment:  "nothing since 01/12/2012"   Sexual activity: Not Currently    Partners: Male  Other Topics Concern   Not on file  Social History Narrative   Not on file   Social Determinants of Health   Financial Resource Strain: High Risk (05/05/2020)   Overall Financial Resource Strain (CARDIA)    Difficulty of Paying Living Expenses: Hard  Food Insecurity: No Food Insecurity (11/15/2021)   Hunger Vital Sign    Worried About Running Out of Food in the Last  Year: Never true    Ran Out of Food in the Last Year: Never true  Recent Concern: Food Insecurity - Food Insecurity Present (08/27/2021)   Hunger Vital Sign    Worried About Running Out of Food in the Last Year: Sometimes true    Ran Out of Food in the Last Year: Never true  Transportation Needs: No Transportation Needs (11/15/2021)   PRAPARE - Administrator, Civil Service (Medical): No    Lack of Transportation (Non-Medical): No  Physical Activity: Inactive (08/27/2021)   Exercise Vital Sign    Days of Exercise per Week: 0 days    Minutes of Exercise per Session: 0 min  Stress: Stress Concern Present (12/26/2020)   [employer] of Occupational Health - Occupational Stress Questionnaire    Feeling of Stress : Very much  Social Connections: Socially Isolated (08/27/2021)   Social Connection and Isolation Panel [NHANES]    Frequency of Communication with Friends and Family: More than three times a week    Frequency of Social Gatherings with Friends and Family: Never    Attends Religious Services: Never    Database administrator or Organizations: No    Attends Banker Meetings: Never    Marital Status: Widowed      Allergies:   Allergies  Allergen Reactions   Keflex [Cephalexin] Rash and Other (See Comments)    Has tolerated amoxicillin since had keflex   Shellfish Allergy Shortness Of Breath, Nausea And Vomiting and Other (See Comments)    Stomach cramps   Tetracyclines & Related Anaphylaxis, Swelling and Other (See Comments)    Throat swelling requiring hospitalization   Dilaudid [Hydromorphone Hcl] Other (See Comments)    "Lethargy"   Prednisone Other (See Comments)    "counter reacts", has tolerated IV medrol   Clindamycin/Lincomycin Other (See Comments)    Burning sensation in her mouth down her throat    Incruse Ellipta [Umeclidinium Bromide] Nausea Only   Molds & Smuts Itching   Tuberculin Tests Other (See Comments)    False postive    Umeclidinium  Other Reaction(s): GI Intolerance    Metabolic Disorder Labs: Lab Results  Component Value Date   HGBA1C 7.0 (H) 02/08/2022   MPG 154 02/08/2022   MPG 148.46 03/14/2021   No results found for: "PROLACTIN" Lab Results  Component Value Date   CHOL 140 06/11/2021   TRIG 98.0 06/11/2021   HDL 51.10 06/11/2021   CHOLHDL 3 06/11/2021   VLDL 19.6 06/11/2021   LDLCALC 69 06/11/2021   LDLCALC 58 03/07/2020   Lab Results  Component Value Date   TSH 0.93 06/11/2021    Therapeutic Level Labs: No results found for: "LITHIUM" No results found for: "CBMZ" No results found for: "VALPROATE"  Current Medications: Current Outpatient Medications  Medication Sig Dispense Refill   Accu-Chek Softclix Lancets lancets Use as instructed 100 each 12   acyclovir (ZOVIRAX) 400 MG tablet TAKE 1 TABLET BY MOUTH  TWICE DAILY (Patient taking differently: Take 400 mg by mouth in the morning.) 180 tablet 3   albuterol (PROVENTIL) (2.5 MG/3ML) 0.083% nebulizer solution Take 3 mLs (2.5 mg total) by nebulization 2 (two) times daily. And every 6 hours as needed.  DX: J44.9 360 mL 3   atenolol (TENORMIN) 25 MG tablet TAKE 1 TABLET BY MOUTH  DAILY 90 tablet 3   atorvastatin (LIPITOR) 20 MG tablet TAKE 1 TABLET BY MOUTH ONCE  DAILY 100 tablet 2   blood glucose meter kit and supplies Dispense based on patient and insurance preference. Use up to two times daily as directed. (FOR ICD-10 E10.9, E11.9). 1 each 0   Budeson-Glycopyrrol-Formoterol (BREZTRI AEROSPHERE) 160-9-4.8 MCG/ACT AERO Inhale 2 puffs into the lungs in the morning and at bedtime. 10.7 g 0   buPROPion (WELLBUTRIN XL) 300 MG 24 hr tablet TAKE 1 TABLET BY MOUTH DAILY 90 tablet 0   busPIRone (BUSPAR) 5 MG tablet Take 1 tablet (5 mg total) by mouth at bedtime. 30 tablet 0   Camphor-Menthol-Methyl Sal (SALONPAS) 3.03-02-08 % PTCH Place 1 patch onto the skin daily as needed (neck pain).     cetirizine (ZYRTEC) 10 MG tablet Take 1 tablet (10  mg total) by mouth 2 (two) times daily. (Patient taking differently: Take 10 mg by mouth daily.) 180 tablet 3   cyclobenzaprine (FLEXERIL) 10 MG tablet TAKE 1 TABLET BY MOUTH AT  BEDTIME 90 tablet 1   diazepam (VALIUM) 5 MG tablet Take 1 tablet (5 mg total) by mouth every 8 (eight) hours as needed for anxiety. 21 tablet 0   diphenhydrAMINE (BENADRYL) 25 mg capsule Take 25 mg by mouth 2 (two) times daily.     fluticasone (FLONASE) 50 MCG/ACT nasal spray Place 2 sprays into both nostrils daily. (Patient taking differently: Place 2 sprays into both nostrils daily as needed for allergies.) 48 g 1   glucose blood test strip Use as instructed 100 each 12   hydrochlorothiazide (HYDRODIURIL) 25 MG tablet TAKE 1 TABLET BY MOUTH DAILY 100 tablet 2   Ipratropium-Albuterol (COMBIVENT RESPIMAT) 20-100 MCG/ACT AERS respimat Inhale 1 puff into the lungs every 6 (six) hours as needed for wheezing or shortness of breath. 12 g 3   levothyroxine (SYNTHROID) 25 MCG tablet TAKE 1 TABLET BY MOUTH DAILY  BEFORE BREAKFAST 100 tablet 2   MELATONIN PO Take 1 tablet by mouth at bedtime as needed (sleep).     montelukast (SINGULAIR) 10 MG tablet TAKE 1 TABLET BY MOUTH AT  BEDTIME 100 tablet 2   naproxen (NAPROSYN) 500 MG tablet Take 1 tablet (500 mg total) by mouth 2 (  two) times daily with a meal. 180 tablet 3   nystatin-triamcinolone ointment (MYCOLOG) Apply 1 application topically 2 (two) times daily as needed (applied to skin folds/groins/under breast skin irritation rash.). 100 g 3   oxyCODONE-acetaminophen (PERCOCET) 5-325 MG tablet Take 1-2 tablets by mouth every 6 (six) hours as needed for severe pain. 50 tablet 0   OXYGEN Inhale 3 L/min into the lungs See admin instructions. Inhale  3 L/min of oxygen into the lungs w/CPAP at bedtime and as needed for shortness of breath with "strenuous activity" during the day     pantoprazole (PROTONIX) 40 MG tablet TAKE 1 TABLET BY MOUTH DAILY 100 tablet 3   PATADAY 0.2 % SOLN Place 1  drop into both eyes daily.     polyethylene glycol (MIRALAX / GLYCOLAX) 17 g packet Take 17 g by mouth daily as needed for mild constipation. 28 packet 0   pregabalin (LYRICA) 300 MG capsule Take 1 capsule (300 mg total) by mouth 2 (two) times daily. 60 capsule 3   QUEtiapine (SEROQUEL) 100 MG tablet Take 1 tablet (100 mg total) by mouth at bedtime. 30 tablet 0   ranolazine (RANEXA) 500 MG 12 hr tablet Take 1 tablet (500 mg total) by mouth 2 (two) times daily. 60 tablet 6   roflumilast (DALIRESP) 500 MCG TABS tablet Take 1 tablet (500 mcg total) by mouth daily. 90 tablet 3   traZODone (DESYREL) 100 MG tablet TAKE 1 TABLET BY MOUTH AT  BEDTIME 100 tablet 3   Varenicline Tartrate, Starter, (CHANTIX STARTING MONTH PAK) 0.5 MG X 11 & 1 MG X 42 TBPK Chantix started pack Take 0.5 mg once daily x 3 days, then take 0.5 mg twice daily x 4 days, then take 1 mg twice daily thereafter. 1 each 1   venlafaxine XR (EFFEXOR-XR) 150 MG 24 hr capsule TAKE 1 CAPSULE BY MOUTH DAILY  WITH BREAKFAST 100 capsule 3   witch hazel-glycerin (TUCKS) pad Apply topically as needed for itching.     No current facility-administered medications for this visit.     Psychiatric Specialty Exam: Review of Systems  Cardiovascular:  Negative for chest pain.  Psychiatric/Behavioral:  Negative for substance abuse and suicidal ideas.     There were no vitals taken for this visit.There is no height or weight on file to calculate BMI.  General Appearance:casual  Eye Contact: fair  Speech:  Slow  Volume:  Decreased  Mood: fair, somewhat stressed  Affect:  congruent  Thought Process:  Goal Directed  Orientation:  Full (Time, Place, and Person)  Thought Content: somewhat paranoid   Suicidal Thoughts:  No  Homicidal Thoughts:  No  Memory:  Immediate;   Fair Recent;   Fair  Judgement:  Fair  Insight:  Shallow  Psychomotor Activity:  Decreased  Concentration:  Concentration: Fair and Attention Span: Fair  Recall:  [employer]  of Knowledge:Fair  Language: Fair  Akathisia:  No  Handed:    AIMS (if indicated): no involuntary movements  Assets:  Desire for Improvement Leisure Time Social Support  ADL's: limited due to recent surgery  Cognition: WNL  Sleep:   variable   Screenings: Mini-Mental    Flowsheet Row Clinical Support from 04/06/2018 in [facility]  Total Score (max 30 points ) 29      PHQ2-9    Flowsheet Row Office Visit from 10/12/2021 in [facility] Clinical Support from 08/27/2021 in [facility]  [place] [facility] Management from 05/04/2021 in [facility] Office Visit from 03/27/2021 in [facility] Chronic Care Management from 01/31/2021 in [facility]  PHQ-2 Total Score 2 0 1 0 0  PHQ-9 Total Score 2 -- -- 0 --      Flowsheet Row Admission (Discharged) from 02/14/2022 in [facility] Pre-Admission Testing 61 from 02/08/2022 in [facility] PREADMISSION TESTING Video Visit from 06/08/2021 in [facility] at [facility]  C-SSRS RISK CATEGORY No Risk No Risk No Risk       Assessment and Plan: as follows  Prior documentation reviewed    Mood disorder unspecified: relavant to multiple medical concerns and past history of using drugs, gets subdued at times due to loneliness, talks with sister, continue meds does not want to increase  Seroquel at night for mood and paranoia   PTSD: baseline, continue effexor  Insomnia: reviewed sleep hygiene, continue seroquel at night,  Add activities during the day   Understands to keep awake during the day Anxiety fluctuates continue effexor  Fu 85m.   Thresa Ross, MD 4/22/20243:07 PM

## 2022-06-18 DIAGNOSIS — J449 Chronic obstructive pulmonary disease, unspecified: Secondary | ICD-10-CM | POA: Diagnosis not present

## 2022-06-18 DIAGNOSIS — J441 Chronic obstructive pulmonary disease with (acute) exacerbation: Secondary | ICD-10-CM | POA: Diagnosis not present

## 2022-06-18 DIAGNOSIS — G4733 Obstructive sleep apnea (adult) (pediatric): Secondary | ICD-10-CM | POA: Diagnosis not present

## 2022-06-20 ENCOUNTER — Ambulatory Visit (INDEPENDENT_AMBULATORY_CARE_PROVIDER_SITE_OTHER): Payer: Medicare Other | Admitting: Licensed Clinical Social Worker

## 2022-06-20 DIAGNOSIS — R443 Hallucinations, unspecified: Secondary | ICD-10-CM | POA: Diagnosis not present

## 2022-06-20 DIAGNOSIS — F4310 Post-traumatic stress disorder, unspecified: Secondary | ICD-10-CM | POA: Diagnosis not present

## 2022-06-20 DIAGNOSIS — F411 Generalized anxiety disorder: Secondary | ICD-10-CM | POA: Diagnosis not present

## 2022-06-20 DIAGNOSIS — F0630 Mood disorder due to known physiological condition, unspecified: Secondary | ICD-10-CM

## 2022-06-20 NOTE — Progress Notes (Signed)
Virtual Visit via Video Note  I connected with Joan [name] on 06/20/22 at  2:00 PM EDT by a video enabled telemedicine application and verified that I am speaking with the correct person using two identifiers.  Location: Patient: home Provider: home office   I discussed the limitations of evaluation and management by telemedicine and the availability of in person appointments. The patient expressed understanding and agreed to proceed.   I discussed the assessment and treatment plan with the patient. The patient was provided an opportunity to ask questions and all were answered. The patient agreed with the plan and demonstrated an understanding of the instructions.   The patient was advised to call back or seek an in-person evaluation if the symptoms worsen or if the condition fails to improve as anticipated.  I provided 52 minutes of non-face-to-face time during this encounter.  THERAPIST PROGRESS NOTE  Session Time: 2:00 PM to 2:52 PM  Participation Level: Active  Behavioral Response: CasualAlertappropriate in session but patient describes struggle she has had recently with her chart and trying to get back on track some dysphoria  Type of Therapy: Individual Therapy  Treatment Goals addressed: Work on trauma, anxiety, depression, coping  ProgressTowards Goals: Progressing-worked on trauma issues building insight that we will help with patient making progress worked on significant stressor related to church therapist supported patient on getting back on track going back as well as helpfulness of preacher she listens to television  Interventions: Solution Focused, Strength-based, Supportive, and Other: coping, trauma  Summary: Joan [name] is a 62 y.o. female and therapist initially started off by talking about trauma that doing informally still working on it talked about EMDR internal processing of trauma and patient says she feels that is what she has been doing  internally processing the trauma. Not been church in 3 weeks. Issues at church with a couple of girls that took her out of church. Trust issues don't trust anyone. Part of faith don't know how to do it so many people screwed her not only materialist things one did but also was deeper than that. Trying to get back to trust. Doesn't have a friend. Family don't want to bother with her. Two sisters put out yesterday therapist asked what she means. Joan [name] telling her busy couldn't stay on the phone. Patient says has this fear people are talking about her. Friends at church don't go up there because feel like talk about her. Made two friends Joan [name] who lives near and 5-6 girls hang out with. Revealed stuff to friend Joan [name] seems trusting know for a fact think she repeated it. Was going where they were outside where they  hang out going everyday slowed down house being neglected. Hanging out all day tired not getting as much as done.  Patient shares getting counseling for spirituality and faith through television through Joan [name]. She can relate being in recovery. When she talks about things explains in another way she read sout of the Bible breaks down more. She will have props with her. She will used props in her story. Can understand and relate with Joan [name] very much. Therapist asks if she helps.  Patient says house like trash things needs to do. Does not have help nobody cares nobody to come and help. Joan [name] says help but only comes when patient has food. Back to talking about Joan [name] sermon talking titled "Things To Do". Joan [name] says don't pray about it and then don't do anything about it. Service was get  up had a chair stop sitting in chair complaining [name] not do it but give you the energy to do it but you have to do the work.  Learned also how to get back to church with Joan [name]'s help. First have to love people don't have to hang out with them. Told therapist about when young pastor up preaching talked to  Joan [name] regarding issues. Hurt her first when pastor put hand on head looked at her said this one saved shake shoulders humiliated did that in front of the church.Therapist asked if does this in general. Prayed for everybody before her but then put hand on head this girl saved nobody clapped was humiliated. Patient said why here saved? Why ask to come to alter with issues?. Didn't have to save her has been done so why up there? Therapist said get clarification not sure if was meant negatively. Patient crept back to chair upset and humiliated. Therapist said not sure if met negatively patient said that is the way she took it. Joan [name] said to patient be there at [employer] preaching that she was good insisted that patient be there this girl started off right away talking about being a fake and looking at patient. Knew she was talking about her. First thing the pastor got ball rolling with insecurities when saved her. After that didn't go to pray and didn't go to alter stay in seat and do what going to do not going to be humiliated. Joan [name] asked her to trust her that is why spoke with her. Patient said young pastor repeated everything they talked about. She is troublemaker in there. Joan [name] friend with Joan [name] and patient said some things in confidence with her Joan [name] went back to the female pastor. She said everything that patient said to Joan [name] in confidence. Definitely about patient knows her words said things said to Joan [name]. Service was about fake and lying. Had a skit.  Her mother who is preacher comes to patient to spend time with patient. Her name is Joan [name] and she is an Chief Technology Officer. She can't be there a lot she is busy a lot. She brings snacks and stuff. They get truck load of food refuge as well as church people can go for food.  Joan [name] female pastor made a skit and patient says it was all about patient. What got her really upset Joan [name] insisted being at [employer]. Approached the pastor Joan [name] woman brought food  told her what happened didn't go to church for two weeks. Stuff put out patient embarrassed and humiliated didn't go to church because of what they did. Told her about how feeling Joan [name] said positively not about her. Devil make her think talking about you. Patient said no devil that girl needs to get on knees and pray. How can be pastor who are you to call somebody calling a fake none of her business. Explored what she is going to do. Pray and was going to go to church Sunday. Woke up late didn't get to church. Plan to go back to church member of church. Therapist said thinks Joan [name] helping her as well as Health and safety inspector. Helping to cope wiht situations and support.  It is good for patient to go back to church.  Therapist talked about approaches to trauma and talked about how informal approaches can be helpful looking for ways trauma models are interfering with healthy functioning.  Talked about EMDR as internal processing patient thinks she is doing some of that herself  internally processing.  Had feelings of being alone assess helpful to be connected with therapist she also has been looking at preacher's on television and she recognizes that they have been helpful have been a kind of counseling.  Talked about difficulties at church therapist tried to guide patient that may be her interpretations were not accurate and to ask patient still feels the way she needs it is accurate interpretation.  She is can go back to church therapist noted this is very positive and the counseling from the television is helping her with things for example to level people in order to have relationships.  Therapist noted this is very helpful as people can be followed below so that attitude helps when people are flawed.  Talked about trust issues patient has and therapist talked about information from her manual on trauma trust is built slowly over time the more you get to know somebody start from a neutral position and slowly  decide if you can trust.  Noted needing to work on this as will keep her from being in relationships if she is unable to trust.  Assess helpful to have this relationship as a way for patient to work on trust issues. Suicidal/Homicidal: No  Plan: Return again in 5 weeks.(Soonest available) 2. Patient process thoughts and feelings to help with coping, work on trauma stressors where needed Diagnosis: PTSD, hallucinations, GAD, Mood Disorder in conditions classified elsewhere  Collaboration of Care: Medication Management AEB review of Dr. Gilmore Laroche note  Patient/Guardian was advised Release of Information must be obtained prior to any record release in order to collaborate their care with an outside provider. Patient/Guardian was advised if they have not already done so to contact the registration department to sign all necessary forms in order for [place] to release information regarding their care.   Consent: Patient/Guardian gives verbal consent for treatment and assignment of benefits for services provided during this visit. Patient/Guardian expressed understanding and agreed to proceed.   Coolidge Breeze, LCSW 06/20/2022

## 2022-06-30 ENCOUNTER — Other Ambulatory Visit (HOSPITAL_COMMUNITY): Payer: Self-pay | Admitting: Psychiatry

## 2022-07-12 ENCOUNTER — Ambulatory Visit: Payer: Medicare Other | Admitting: Acute Care

## 2022-07-21 ENCOUNTER — Other Ambulatory Visit: Payer: Self-pay | Admitting: Internal Medicine

## 2022-07-21 ENCOUNTER — Other Ambulatory Visit: Payer: Self-pay | Admitting: Pulmonary Disease

## 2022-07-30 ENCOUNTER — Ambulatory Visit (INDEPENDENT_AMBULATORY_CARE_PROVIDER_SITE_OTHER): Admitting: Pulmonary Disease

## 2022-07-30 ENCOUNTER — Other Ambulatory Visit (HOSPITAL_COMMUNITY): Payer: Self-pay | Admitting: Psychiatry

## 2022-07-30 ENCOUNTER — Ambulatory Visit (HOSPITAL_BASED_OUTPATIENT_CLINIC_OR_DEPARTMENT_OTHER): Admitting: Pulmonary Disease

## 2022-07-30 ENCOUNTER — Other Ambulatory Visit: Payer: Self-pay | Admitting: Allergy & Immunology

## 2022-07-30 ENCOUNTER — Other Ambulatory Visit: Payer: Self-pay | Admitting: Internal Medicine

## 2022-07-30 ENCOUNTER — Encounter (HOSPITAL_BASED_OUTPATIENT_CLINIC_OR_DEPARTMENT_OTHER): Payer: Self-pay | Admitting: Pulmonary Disease

## 2022-07-30 VITALS — BP 118/68 | HR 89 | Ht 68.0 in | Wt 202.0 lb

## 2022-07-30 DIAGNOSIS — G4733 Obstructive sleep apnea (adult) (pediatric): Secondary | ICD-10-CM | POA: Diagnosis not present

## 2022-07-30 DIAGNOSIS — J449 Chronic obstructive pulmonary disease, unspecified: Secondary | ICD-10-CM | POA: Diagnosis not present

## 2022-07-30 DIAGNOSIS — J441 Chronic obstructive pulmonary disease with (acute) exacerbation: Secondary | ICD-10-CM | POA: Diagnosis not present

## 2022-07-30 MED ORDER — AZITHROMYCIN 250 MG PO TABS: ORAL_TABLET | ORAL | 0 refills | Status: DC

## 2022-07-30 MED ORDER — GUAIFENESIN-CODEINE 225-7.5 MG/5ML PO LIQD: 5.0000 mL | Freq: Three times a day (TID) | ORAL | 0 refills | Status: DC | PRN

## 2022-07-30 MED ORDER — METHYLPREDNISOLONE ACETATE 80 MG/ML IJ SUSP
120.0000 mg | Freq: Once | INTRAMUSCULAR | Status: AC
Start: 2022-07-30 — End: 2022-07-30
  Administered 2022-07-30: 120 mg via INTRAMUSCULAR

## 2022-07-30 NOTE — Progress Notes (Signed)
   Subjective:    Patient ID: Joan [name], female    DOB: 05/07/1960, 62 y.o.   MRN: [id number]  HPI 62 yo smoker for follow-up of severe COPD and OSA -on CPAP 10 cm   Meds -allergy listed to prednisone-this caused pedal edema, prefers Medrol when needed  Chief Complaint  Patient presents with   Follow-up    Pt has been having complaints of coughing since last visit.   I am seeing Joan [name] after 9 months.  She tells me that she is under hospice care now for the last 2 months.  Her COPD is considered terminal.  I was not aware of this transition in her care. She is concerned that hospice has changed some of her medications.  She is appreciative of the help she is getting at home. She has not been sick for 4 days, coughing up white foamy to green sputum.  She was given Levaquin and methylprednisone Dosepak.  She reports a salty taste in her mouth.  She has had reactions to prednisone in the past and does not want to take this anymore she requests alternative.  She has tolerated Solu-Medrol IV in the past before. She is still using her CPAP intermittently but this dries her up in the morning and she is feels like she has a fir ball in her throat Markus Daft was approved for a year and she is still getting this from the company    Significant tests/ events reviewed NPSG 02/2017 >> no OSA HST  04/2017 15/h     02/2019 pFTS >> RATIO 45, fev 1 37% - UNCHANGED , + bd response, DLCO 44%      10/2016 PFTs showed ratio 49, FEV1 of 36%, FVC 58% and DLCO of 34% consistent with severe obstruction.   04/2021 LDCT >> stable, RADS 2   02/2020 LDCT >>  stable nodules  11/2016 CT chest bullous changes BL, 4mm RUl & 2 mm LUL  nodules >> stable      ONO 11/2016 on CPAP/room air showed desaturation for 1 hour 45 minutes started on nocturnal oxygen>>  Review of Systems neg for any significant sore throat, dysphagia, itching, sneezing, nasal congestion or excess/ purulent secretions, fever, chills, sweats,  unintended wt loss, pleuritic or exertional cp, hempoptysis, orthopnea pnd or change in chronic leg swelling. Also denies presyncope, palpitations, heartburn, abdominal pain, nausea, vomiting, diarrhea or change in bowel or urinary habits, dysuria,hematuria, rash, arthralgias, visual complaints, headache, numbness weakness or ataxia.     Objective:   Physical Exam  Gen. Pleasant, obese, in no distress ENT - no lesions, no post nasal drip Neck: No JVD, no thyromegaly, no carotid bruits Lungs: no use of accessory muscles, no dullness to percussion, decreased without rales or rhonchi  Cardiovascular: Rhythm regular, heart sounds  normal, no murmurs or gallops, no peripheral edema Musculoskeletal: No deformities, no cyanosis or clubbing , no tremors       Assessment & Plan:

## 2022-07-30 NOTE — Patient Instructions (Signed)
X solumedrol 120 mg IM x 1 X Zpak  Rx for cough syrup will be sent to pharmacy

## 2022-07-30 NOTE — Assessment & Plan Note (Signed)
CPAP download was reviewed and shows good control of events on 10 cm Weight loss encouraged, compliance with goal of at least 4-6 hrs every night is the expectation. Advised against medications with sedative side effects Cautioned against driving when sleepy - understanding that sleepiness will vary on a day to day basis

## 2022-07-30 NOTE — Assessment & Plan Note (Addendum)
We will treat Joan [name] for COPD exacerbation.  Since she has not tolerated prednisone and is having a salty taste with methylprednisone Dosepak, will give her Solu-Medrol IM today. Will also give her a prescription for Z-Pak, she does not like Levaquin. We will send in prescription for hydrocodone cough syrup Not sure that her condition is terminal but she is appreciative the care she is getting from hospice

## 2022-07-31 DIAGNOSIS — R6889 Other general symptoms and signs: Secondary | ICD-10-CM | POA: Diagnosis not present

## 2022-07-31 DIAGNOSIS — R0902 Hypoxemia: Secondary | ICD-10-CM | POA: Diagnosis not present

## 2022-07-31 DIAGNOSIS — Z87891 Personal history of nicotine dependence: Secondary | ICD-10-CM | POA: Diagnosis not present

## 2022-07-31 DIAGNOSIS — R062 Wheezing: Secondary | ICD-10-CM | POA: Diagnosis not present

## 2022-07-31 DIAGNOSIS — J441 Chronic obstructive pulmonary disease with (acute) exacerbation: Secondary | ICD-10-CM | POA: Diagnosis not present

## 2022-07-31 DIAGNOSIS — Z743 Need for continuous supervision: Secondary | ICD-10-CM | POA: Diagnosis not present

## 2022-07-31 DIAGNOSIS — E860 Dehydration: Secondary | ICD-10-CM | POA: Diagnosis not present

## 2022-07-31 DIAGNOSIS — R059 Cough, unspecified: Secondary | ICD-10-CM | POA: Diagnosis not present

## 2022-07-31 DIAGNOSIS — E119 Type 2 diabetes mellitus without complications: Secondary | ICD-10-CM | POA: Diagnosis not present

## 2022-08-01 ENCOUNTER — Telehealth (HOSPITAL_COMMUNITY): Payer: Self-pay | Admitting: *Deleted

## 2022-08-01 NOTE — Telephone Encounter (Signed)
buPROPion (WELLBUTRIN XL) 300 MG 24 hr tablet   P.A. NOT REQUIRED

## 2022-08-05 DIAGNOSIS — M4322 Fusion of spine, cervical region: Secondary | ICD-10-CM | POA: Diagnosis not present

## 2022-08-06 ENCOUNTER — Other Ambulatory Visit (HOSPITAL_COMMUNITY): Payer: Self-pay | Admitting: Psychiatry

## 2022-08-08 ENCOUNTER — Ambulatory Visit (HOSPITAL_COMMUNITY): Payer: Medicare Other | Admitting: Licensed Clinical Social Worker

## 2022-08-13 ENCOUNTER — Other Ambulatory Visit: Payer: Self-pay | Admitting: Allergy & Immunology

## 2022-08-13 ENCOUNTER — Ambulatory Visit (INDEPENDENT_AMBULATORY_CARE_PROVIDER_SITE_OTHER): Admitting: Internal Medicine

## 2022-08-13 ENCOUNTER — Encounter: Payer: Self-pay | Admitting: Internal Medicine

## 2022-08-13 ENCOUNTER — Other Ambulatory Visit: Payer: Self-pay | Admitting: Internal Medicine

## 2022-08-13 VITALS — BP 120/80 | HR 56 | Temp 98.1°F | Ht 68.0 in | Wt 194.0 lb

## 2022-08-13 DIAGNOSIS — J431 Panlobular emphysema: Secondary | ICD-10-CM | POA: Diagnosis not present

## 2022-08-13 DIAGNOSIS — E785 Hyperlipidemia, unspecified: Secondary | ICD-10-CM | POA: Diagnosis not present

## 2022-08-13 DIAGNOSIS — E1140 Type 2 diabetes mellitus with diabetic neuropathy, unspecified: Secondary | ICD-10-CM

## 2022-08-13 DIAGNOSIS — L299 Pruritus, unspecified: Secondary | ICD-10-CM | POA: Diagnosis not present

## 2022-08-13 DIAGNOSIS — J410 Simple chronic bronchitis: Secondary | ICD-10-CM

## 2022-08-13 DIAGNOSIS — Z7984 Long term (current) use of oral hypoglycemic drugs: Secondary | ICD-10-CM | POA: Diagnosis not present

## 2022-08-13 DIAGNOSIS — E1169 Type 2 diabetes mellitus with other specified complication: Secondary | ICD-10-CM

## 2022-08-13 DIAGNOSIS — I739 Peripheral vascular disease, unspecified: Secondary | ICD-10-CM | POA: Diagnosis not present

## 2022-08-13 MED ORDER — BLOOD GLUCOSE TEST VI STRP: 1.0000 | ORAL_STRIP | Freq: Three times a day (TID) | 0 refills | Status: DC

## 2022-08-13 MED ORDER — BLOOD GLUCOSE MONITORING SUPPL DEVI: 1.0000 | Freq: Three times a day (TID) | 0 refills | Status: DC

## 2022-08-13 MED ORDER — TRIAMCINOLONE ACETONIDE 0.1 % EX CREA: 1.0000 | TOPICAL_CREAM | Freq: Two times a day (BID) | CUTANEOUS | 6 refills | Status: DC

## 2022-08-13 MED ORDER — LANCET DEVICE MISC: 1.0000 | Freq: Three times a day (TID) | 0 refills | Status: DC

## 2022-08-13 MED ORDER — LANCETS MISC. MISC: 1.0000 | Freq: Three times a day (TID) | 0 refills | Status: DC

## 2022-08-13 NOTE — Progress Notes (Unsigned)
   Subjective:   Patient ID: Joan [name], female    DOB: 03-09-1960, 62 y.o.   MRN: [id number]  HPI The patient is a 62 YO female coming in for skin irritation. She is also struggling with mobility and has almost fallen multiple times trying to get in and out of tub. Needs walk in shower.   Review of Systems  Constitutional:  Positive for activity change and fatigue.  HENT: Negative.    Eyes: Negative.   Respiratory:  Positive for cough, shortness of breath and wheezing. Negative for chest tightness.   Cardiovascular:  Negative for chest pain, palpitations and leg swelling.  Gastrointestinal:  Negative for abdominal distention, abdominal pain, constipation, diarrhea, nausea and vomiting.  Musculoskeletal:  Positive for arthralgias, gait problem and myalgias.  Skin: Negative.   Psychiatric/Behavioral: Negative.      Objective:  Physical Exam Constitutional:      Appearance: She is well-developed.     Comments: Chronically ill appearing  HENT:     Head: Normocephalic and atraumatic.  Cardiovascular:     Rate and Rhythm: Normal rate and regular rhythm.  Pulmonary:     Effort: Pulmonary effort is normal. No respiratory distress.     Breath sounds: Wheezing and rhonchi present. No rales.     Comments: Not using oxygen during visit but has with her Abdominal:     General: Bowel sounds are normal. There is no distension.     Palpations: Abdomen is soft.     Tenderness: There is no abdominal tenderness. There is no rebound.  Musculoskeletal:        General: Tenderness present.     Cervical back: Normal range of motion.  Skin:    General: Skin is warm and dry.  Neurological:     Mental Status: She is alert and oriented to person, place, and time.     Coordination: Coordination abnormal.     Comments: Wheelchair in office     Vitals:   08/13/22 1453  BP: 120/80  Pulse: (!) 56  Temp: 98.1 F (36.7 C)  TempSrc: Oral  SpO2: 97%  Weight: 194 lb (88 kg)  Height: 5\' 8"   (1.727 m)    Assessment & Plan:

## 2022-08-13 NOTE — Patient Instructions (Addendum)
We have sent in a cream to use for itching up to twice a day. Likely your medications are making you sun sensitive which makes the skin dry.  We have given you a prescription for the blood pressure and pulse ox meter.

## 2022-08-14 DIAGNOSIS — I739 Peripheral vascular disease, unspecified: Secondary | ICD-10-CM | POA: Insufficient documentation

## 2022-08-14 DIAGNOSIS — L299 Pruritus, unspecified: Secondary | ICD-10-CM | POA: Insufficient documentation

## 2022-08-14 NOTE — Assessment & Plan Note (Signed)
Could be medication related. This itches more in the sun. Asked her to use clothes to cover and SDF containing sunscreen as several of her medications can make her photosensitive. Rx triamcinolone to help with itching so she can avoid scratching. Recommended to avoid scratching.

## 2022-08-14 NOTE — Assessment & Plan Note (Signed)
Very severe and is on hospice now per patient due to severity. She was not using oxygen during our visit although she is supposed to be on this 3-4L 24/7

## 2022-08-14 NOTE — Assessment & Plan Note (Signed)
Taking atorvastatin and will continue. She is advised to quit smoking and is unable to make attempt at this time.

## 2022-08-14 NOTE — Assessment & Plan Note (Signed)
Taking atorvastatin 20 mg daily and will continue. Check lipid panel yearly.

## 2022-08-14 NOTE — Assessment & Plan Note (Signed)
Taking lyrica 300 mg BID for pain and stable overall.

## 2022-08-14 NOTE — Assessment & Plan Note (Signed)
Encouraged to quit smoking and she is unable.

## 2022-08-19 ENCOUNTER — Telehealth: Payer: Self-pay | Admitting: Internal Medicine

## 2022-08-19 NOTE — Telephone Encounter (Signed)
Prescription Request  08/19/2022  LOV: 08/13/2022  What is the name of the medication or equipment? triamcinolone cream (KENALOG) 0.1 %   Have you contacted your pharmacy to request a refill? No   Which pharmacy would you like this sent to?   [facility] - [place], [place] - [address] [place] [address] Phone: [phone] Fax: [phone]  Patient notified that their request is being sent to the clinical staff for review and that they should receive a response within 2 business days.   Please advise at Mobile [phone] (mobile)

## 2022-08-20 ENCOUNTER — Other Ambulatory Visit: Payer: Self-pay

## 2022-08-20 MED ORDER — TRIAMCINOLONE ACETONIDE 0.1 % EX CREA: 1.0000 | TOPICAL_CREAM | Freq: Two times a day (BID) | CUTANEOUS | 6 refills | Status: DC

## 2022-08-20 NOTE — Telephone Encounter (Signed)
Refill has been sent in.  

## 2022-08-22 ENCOUNTER — Telehealth: Payer: Self-pay | Admitting: Internal Medicine

## 2022-08-22 ENCOUNTER — Ambulatory Visit (HOSPITAL_COMMUNITY): Payer: Medicare Other | Admitting: Licensed Clinical Social Worker

## 2022-08-22 NOTE — Telephone Encounter (Signed)
Patient called and states that [employer] said that Dr. Okey Dupre needs to place an order for a new wheel chair because the wheels are so bad.  Please send this order to [facility].

## 2022-08-22 NOTE — Telephone Encounter (Signed)
Called pt no answer LMOM RTC.../lmb 

## 2022-08-23 ENCOUNTER — Telehealth: Payer: Self-pay | Admitting: Radiology

## 2022-08-23 NOTE — Telephone Encounter (Signed)
LVM for pt to reschedule her AWV on 08/30/22 we will not have a AWV Nurse that day. Okay to reschedule any day after 08/30/22

## 2022-08-27 ENCOUNTER — Telehealth: Payer: Self-pay | Admitting: Internal Medicine

## 2022-08-27 NOTE — Telephone Encounter (Signed)
Patient said she lost her glucose monitoring kit and supplies in a move. She would like to know if Dr. Okey Dupre can prescribe her a new on with the supplies. Best callback is [phone].

## 2022-08-28 ENCOUNTER — Telehealth: Payer: Self-pay | Admitting: Internal Medicine

## 2022-08-28 ENCOUNTER — Other Ambulatory Visit: Payer: Self-pay

## 2022-08-28 MED ORDER — LANCETS MISC. MISC: 1.0000 | Freq: Three times a day (TID) | 0 refills | Status: AC

## 2022-08-28 MED ORDER — BLOOD GLUCOSE MONITORING SUPPL DEVI: 1.0000 | Freq: Three times a day (TID) | 0 refills | Status: DC

## 2022-08-28 MED ORDER — BLOOD GLUCOSE TEST VI STRP: 1.0000 | ORAL_STRIP | Freq: Three times a day (TID) | 0 refills | Status: DC

## 2022-08-28 NOTE — Telephone Encounter (Signed)
Tawana from [facility] called about patient's reasonable accomodation form that was faxed on 08/15/2022. They received the form when it was faxed back on 08/19/2022, but it was not fully completed. They said the first question was not answered. They would like to know if the form can be finished and re-faxed. Kathie Rhodes said after today, she would not be back in the office until Tuesday, 09/03/2022. Best callback for Kathie Rhodes is [phone].

## 2022-08-28 NOTE — Telephone Encounter (Signed)
This has been answered and fax back over and conformed with tawana that she has received it

## 2022-08-28 NOTE — Telephone Encounter (Signed)
Sent in

## 2022-08-30 ENCOUNTER — Ambulatory Visit (INDEPENDENT_AMBULATORY_CARE_PROVIDER_SITE_OTHER): Payer: Medicare Other

## 2022-08-30 VITALS — BP 124/72 | HR 72 | Temp 98.4°F | Ht 68.0 in | Wt 200.0 lb

## 2022-08-30 DIAGNOSIS — Z0000 Encounter for general adult medical examination without abnormal findings: Secondary | ICD-10-CM | POA: Diagnosis not present

## 2022-08-30 NOTE — Progress Notes (Signed)
Subjective:   Joan [name] is a 62 y.o. female who presents for Medicare Annual (Subsequent) preventive examination.  Visit Complete: In person  Review of Systems    Defer to PCP Cardiac Risk Factors include: diabetes mellitus;dyslipidemia;hypertension;obesity (BMI >30kg/m2);advanced age (>13men, >93 women)     Objective:    Today's Vitals   08/30/22 1117  BP: 124/72  Pulse: 72  Temp: 98.4 F (36.9 C)  TempSrc: Temporal  SpO2: 92%  Weight: 200 lb (90.7 kg)  Height: 5\' 8"  (1.727 m)   Body mass index is 30.41 kg/m.     08/30/2022   12:32 PM 02/08/2022    1:40 PM 08/27/2021    1:49 PM 04/03/2021    1:10 AM 03/14/2021    3:00 AM 08/21/2020    4:00 PM 08/07/2020    6:47 PM  Advanced Directives  Does Patient Have a Medical Advance Directive? Yes Yes No Yes Yes Yes Yes  Type of Sales promotion account executive of [employer];Living will  Healthcare Power of [employer] Power of Attorney    Does patient want to make changes to medical advance directive?  No - Patient declined  No - Patient declined No - Patient declined    Copy of Healthcare Power of Attorney in Chart? No - copy requested No - copy requested       Would patient like information on creating a medical advance directive?   No - Patient declined   Yes (ED - Information included in AVS) No - Patient declined    Current Medications (verified) Outpatient Encounter Medications as of 08/30/2022  Medication Sig   Accu-Chek Softclix Lancets lancets Use as instructed   acyclovir (ZOVIRAX) 400 MG tablet TAKE 1 TABLET BY MOUTH TWICE  DAILY   albuterol (PROVENTIL) (2.5 MG/3ML) 0.083% nebulizer solution Take 3 mLs (2.5 mg total) by nebulization 2 (two) times daily. And every 6 hours as needed.  DX: J44.9   atenolol (TENORMIN) 25 MG tablet TAKE 1 TABLET BY MOUTH  DAILY   atorvastatin (LIPITOR) 20 MG tablet TAKE 1 TABLET BY MOUTH ONCE  DAILY   azithromycin (ZITHROMAX) 250 MG tablet Take  two today and then one daily until finished.   Blood Glucose Monitoring Suppl DEVI 1 each by Does not apply route in the morning, at noon, and at bedtime. May substitute to any manufacturer covered by patient's insurance.   Budeson-Glycopyrrol-Formoterol (BREZTRI AEROSPHERE) 160-9-4.8 MCG/ACT AERO Inhale 2 puffs into the lungs in the morning and at bedtime.   buPROPion (WELLBUTRIN XL) 300 MG 24 hr tablet TAKE 1 TABLET BY MOUTH DAILY   busPIRone (BUSPAR) 5 MG tablet Take 1 tablet (5 mg total) by mouth at bedtime.   Camphor-Menthol-Methyl Sal (SALONPAS) 3.03-02-08 % PTCH Place 1 patch onto the skin daily as needed (neck pain).   cyclobenzaprine (FLEXERIL) 10 MG tablet TAKE 1 TABLET BY MOUTH AT  BEDTIME   diazepam (VALIUM) 5 MG tablet Take 1 tablet (5 mg total) by mouth every 8 (eight) hours as needed for anxiety.   diphenhydrAMINE (BENADRYL) 25 mg capsule Take 25 mg by mouth 2 (two) times daily.   Glucose Blood (BLOOD GLUCOSE TEST STRIPS) STRP 1 each by In Vitro route in the morning, at noon, and at bedtime. May substitute to any manufacturer covered by patient's insurance.   guaiFENesin-Codeine 225-7.5 MG/5ML LIQD Take 5 mLs by mouth 3 (three) times daily as needed.   hydrochlorothiazide (HYDRODIURIL) 25 MG tablet TAKE 1 TABLET BY MOUTH DAILY  Ipratropium-Albuterol (COMBIVENT RESPIMAT) 20-100 MCG/ACT AERS respimat Inhale 1 puff into the lungs every 6 (six) hours as needed for wheezing or shortness of breath.   Lancet Device MISC 1 each by Does not apply route in the morning, at noon, and at bedtime. May substitute to any manufacturer covered by patient's insurance.   Lancets Misc. MISC 1 each by Does not apply route in the morning, at noon, and at bedtime. May substitute to any manufacturer covered by patient's insurance.   levothyroxine (SYNTHROID) 25 MCG tablet TAKE 1 TABLET BY MOUTH DAILY  BEFORE BREAKFAST   MELATONIN PO Take 1 tablet by mouth at bedtime as needed (sleep).   montelukast (SINGULAIR)  10 MG tablet TAKE 1 TABLET BY MOUTH AT  BEDTIME   naproxen (NAPROSYN) 500 MG tablet Take 1 tablet (500 mg total) by mouth 2 (two) times daily with a meal.   nystatin-triamcinolone ointment (MYCOLOG) Apply 1 application topically 2 (two) times daily as needed (applied to skin folds/groins/under breast skin irritation rash.).   OXYGEN Inhale 3 L/min into the lungs See admin instructions. Inhale  3 L/min of oxygen into the lungs w/CPAP at bedtime and as needed for shortness of breath with "strenuous activity" during the day   pantoprazole (PROTONIX) 40 MG tablet TAKE 1 TABLET BY MOUTH DAILY   PATADAY 0.2 % SOLN Place 1 drop into both eyes daily.   polyethylene glycol (MIRALAX / GLYCOLAX) 17 g packet Take 17 g by mouth daily as needed for mild constipation.   pregabalin (LYRICA) 300 MG capsule Take 1 capsule (300 mg total) by mouth 2 (two) times daily.   QUEtiapine (SEROQUEL) 100 MG tablet TAKE 1 TABLET BY MOUTH AT  BEDTIME   roflumilast (DALIRESP) 500 MCG TABS tablet Take 1 tablet (500 mcg total) by mouth daily.   traZODone (DESYREL) 100 MG tablet TAKE 1 TABLET BY MOUTH AT  BEDTIME   triamcinolone cream (KENALOG) 0.1 % Apply 1 Application topically 2 (two) times daily.   Varenicline Tartrate, Starter, (CHANTIX STARTING MONTH PAK) 0.5 MG X 11 & 1 MG X 42 TBPK Chantix started pack Take 0.5 mg once daily x 3 days, then take 0.5 mg twice daily x 4 days, then take 1 mg twice daily thereafter.   venlafaxine XR (EFFEXOR-XR) 150 MG 24 hr capsule TAKE 1 CAPSULE BY MOUTH DAILY  WITH BREAKFAST   witch hazel-glycerin (TUCKS) pad Apply topically as needed for itching.   cetirizine (ZYRTEC) 10 MG tablet Take 1 tablet (10 mg total) by mouth 2 (two) times daily. (Patient taking differently: Take 10 mg by mouth daily.)   fluticasone (FLONASE) 50 MCG/ACT nasal spray Place 2 sprays into both nostrils daily. (Patient taking differently: Place 2 sprays into both nostrils daily as needed for allergies.)   No  facility-administered encounter medications on file as of 08/30/2022.    Allergies (verified) Keflex [cephalexin], Shellfish allergy, Tetracyclines & related, Dilaudid [hydromorphone hcl], Prednisone, Clindamycin/lincomycin, Incruse ellipta [umeclidinium bromide], Molds & smuts, Tuberculin tests, and Umeclidinium   History: Past Medical History:  Diagnosis Date   Acute respiratory failure with hypoxia (HCC)    Alcoholism and drug addiction in family    Anxiety    Appendicitis    Arthritis    "knees, hips, lower back" (09/25/2016)   Asthma    Bipolar disorder (HCC)    Chronic bronchitis (HCC)    "all the time" (09/25/2016)   Chronic leg pain    RLE   Chronic lower back pain    COPD (  chronic obstructive pulmonary disease) (HCC)    Depression    Diabetes mellitus without complication (HCC)    Diabetic neuropathy (HCC)    Early cataracts, bilateral    Emphysema of lung (HCC)    Generalized anxiety disorder 02/09/2020   GERD (gastroesophageal reflux disease)    Headache    Hepatitis C    "treated back in 2001-2002"   High cholesterol    Hypertension    Hypothyroidism    Incarcerated ventral hernia 04/04/2017   Mood disorder in conditions classified elsewhere 02/09/2020   On home oxygen therapy    "2L; at nighttime" (04/04/2017)   OSA on CPAP    CPAP with Oxygen 3 liters   Paranoid (HCC)    Pneumonia    "all the time" (09/25/2016)   Positive PPD    with negative chest x ray   PTSD (post-traumatic stress disorder) 02/09/2020   Tachycardia    patient reports with exertion ; denies any other conditions    Thyroid disease    Wears glasses    Wears partial dentures    Past Surgical History:  Procedure Laterality Date   ANTERIOR CERVICAL DECOMP/DISCECTOMY FUSION N/A 02/14/2022   Procedure: Anterior Cervical Decompression/Discectomy Fusion Cervical Three-Cervical Four;  Surgeon: Bedelia Person, MD;  Location: [facility] OR;  Service: Neurosurgery;  Laterality: N/A;  3C   APPENDECTOMY      BACK SURGERY     BIOPSY  11/24/2018   Procedure: BIOPSY;  Surgeon: Tressia Danas, MD;  Location: [facility] ENDOSCOPY;  Service: Gastroenterology;;   CARDIAC CATHETERIZATION     COLONOSCOPY     COLONOSCOPY WITH PROPOFOL N/A 11/24/2018   Procedure: COLONOSCOPY WITH PROPOFOL;  Surgeon: Tressia Danas, MD;  Location: [facility];  Service: Gastroenterology;  Laterality: N/A;   CYST EXCISION     removal of cyst in sinuses   DILATION AND CURETTAGE OF UTERUS     ESOPHAGOGASTRODUODENOSCOPY (EGD) WITH PROPOFOL N/A 11/24/2018   Procedure: ESOPHAGOGASTRODUODENOSCOPY (EGD) WITH PROPOFOL;  Surgeon: Tressia Danas, MD;  Location: [facility];  Service: Gastroenterology;  Laterality: N/A;   FOOT SURGERY Bilateral    bone removed from 4th and 5 th toes and put in pin then took pin out   HERNIA REPAIR     INCISION AND DRAINAGE  ~ 2014/2015   "removed mesh & infection"   INCISIONAL HERNIA REPAIR N/A 04/04/2017   Procedure: LAPAROSCOPIC INCISIONAL HERNIA REPAIR WITH MESH;  Surgeon: Claud Kelp, MD;  Location: [facility] OR;  Service: General;  Laterality: N/A;   LACERATION REPAIR Right    repair wrist artery from laceration from ice skate   LAPAROSCOPIC APPENDECTOMY N/A 03/31/2019   Procedure: APPENDECTOMY LAPAROSCOPIC;  Surgeon: Axel Filler, MD;  Location: [facility] ORS;  Service: General;  Laterality: N/A;   LAPAROSCOPIC APPENDECTOMY N/A 07/09/2019   Procedure: APPENDECTOMY LAPAROSCOPIC;  Surgeon: Axel Filler, MD;  Location: [facility] OR;  Service: General;  Laterality: N/A;   LAPAROSCOPIC INCISIONAL / UMBILICAL / VENTRAL HERNIA REPAIR  04/04/2017   LAPAROSCOPIC INCISIONAL HERNIA REPAIR WITH MESH   LIPOMA EXCISION Right    fattty lipoma in neck   NASAL SEPTUM SURGERY     POSTERIOR LUMBAR FUSION  2015/2016 X 2   "added rods and screws"   SINOSCOPY     TOTAL HIP ARTHROPLASTY Right 02/05/2017   Procedure: RIGHT TOTAL HIP ARTHROPLASTY;  Surgeon: Ranee Gosselin, MD;  Location: [facility]S;  Service:  Orthopedics;  Laterality: Right;   TOTAL HIP ARTHROPLASTY Left 06/18/2017   Procedure: LEFT TOTAL HIP ARTHROPLASTY;  Surgeon: Ranee Gosselin, MD;  Location: [facility];  Service: Orthopedics;  Laterality: Left;   TOTAL HIP REVISION Left 01/26/2019   Procedure: TOTAL HIP REVISION;  Surgeon: Durene Romans, MD;  Location: [facility];  Service: Orthopedics;  Laterality: Left;  2 hrs   TUBAL LIGATION     UMBILICAL HERNIA REPAIR  ~ 2013   w/mesh   UPPER GI ENDOSCOPY     Family History  Problem Relation Age of Onset   Diabetes Mother    Hypertension Mother    Hypertension Father    Cancer Father        lung   Breast cancer Neg Hx    Social History   Socioeconomic History   Marital status: Widowed    Spouse name: Not on file   Number of children: 3   Years of education: Not on file   Highest education level: 9th grade  Occupational History   Not on file  Tobacco Use   Smoking status: Every Day    Packs/day: 2.00    Years: 46.00    Additional pack years: 0.00    Total pack years: 92.00    Types: Cigarettes    Start date: 1977    Passive exposure: Current   Smokeless tobacco: Never   Tobacco comments:    1ppd as of 07/30/22 ep  Vaping Use   Vaping Use: Never used  Substance and Sexual Activity   Alcohol use: Not Currently   Drug use: Not Currently    Types: "Crack" cocaine    Comment:  "nothing since 01/12/2012"   Sexual activity: Not Currently    Partners: Male  Other Topics Concern   Not on file  Social History Narrative   Not on file   Social Determinants of Health   Financial Resource Strain: High Risk (08/30/2022)   Overall Financial Resource Strain (CARDIA)    Difficulty of Paying Living Expenses: Hard  Food Insecurity: No Food Insecurity (08/30/2022)   Hunger Vital Sign    Worried About Running Out of Food in the Last Year: Never true    Ran Out of Food in the Last Year: Never true  Recent Concern: Food Insecurity - Food Insecurity Present (08/12/2022)   Hunger Vital  Sign    Worried About Running Out of Food in the Last Year: Sometimes true    Ran Out of Food in the Last Year: Sometimes true  Transportation Needs: Unmet Transportation Needs (08/30/2022)   PRAPARE - Administrator, Civil Service (Medical): Yes    Lack of Transportation (Non-Medical): Yes  Physical Activity: Inactive (08/30/2022)   Exercise Vital Sign    Days of Exercise per Week: 0 days    Minutes of Exercise per Session: 0 min  Stress: No Stress Concern Present (08/30/2022)   [employer] of Occupational Health - Occupational Stress Questionnaire    Feeling of Stress : Only a little  Recent Concern: Stress - Stress Concern Present (08/12/2022)   [employer] of Occupational Health - Occupational Stress Questionnaire    Feeling of Stress : Very much  Social Connections: Moderately Isolated (08/30/2022)   Social Connection and Isolation Panel [NHANES]    Frequency of Communication with Friends and Family: Once a week    Frequency of Social Gatherings with Friends and Family: Once a week    Attends Religious Services: 1 to 4 times per year    Active Member of [employer] or Organizations: Yes    Attends Banker Meetings: 1  to 4 times per year    Marital Status: Widowed    Tobacco Counseling Ready to quit: Not Answered Counseling given: Not Answered Tobacco comments: 1ppd as of 07/30/22 ep   Clinical Intake:  Pre-visit preparation completed: No  Pain : No/denies pain     Nutritional Status: BMI > 30  Obese Nutritional Risks: None Diabetes: Yes CBG done?: No CBG resulted in Enter/ Edit results?: No Did pt. bring in CBG monitor from home?: No  What is the last grade level you completed in school?: 8th grade Nutrition Risk Assessment:  Has the patient had any N/V/D within the last 2 months?  Yes  Does the patient have any non-healing wounds?  No  Has the patient had any unintentional weight loss or weight gain?  No   Diabetes:  Is the patient  diabetic?  Yes  If diabetic, was a CBG obtained today?  No  Did the patient bring in their glucometer from home?  No  How often do you monitor your CBG's? Every week.   Financial Strains and Diabetes Management:  Are you having any financial strains with the device, your supplies or your medication? Yes .  Does the patient want to be seen by Chronic Care Management for management of their diabetes?  No  Would the patient like to be referred to a Nutritionist or for Diabetic Management?  No   Diabetic Exams:  Diabetic Eye Exam: Overdue for diabetic eye exam. Pt has been advised about the importance in completing this exam. Patient advised to call and schedule an eye exam. Diabetic Foot Exam: Overdue, Pt has been advised about the importance in completing this exam. Pt is scheduled for diabetic foot exam on N/A.  Interpreter Needed?: No      Activities of Daily Living    08/30/2022   12:24 PM 02/08/2022    1:48 PM  In your present state of health, do you have any difficulty performing the following activities:  Hearing? 0   Vision? 0   Difficulty concentrating or making decisions? 1   Walking or climbing stairs? 1   Dressing or bathing? 1   Doing errands, shopping? 1 1  Comment has an Wellsite geologist and eating ? Y   Using the Toilet? Y   In the past six months, have you accidently leaked urine? N   Do you have problems with loss of bowel control? N   Managing your Medications? N   Comment but dose have an aide   Managing your Finances? N   Comment but dose have an Hydrologist or managing your Housekeeping? N   Comment but dose have an aide     Patient Care Team: Myrlene Broker, MD as PCP - General (Internal Medicine) Runell Gess, MD as PCP - Cardiology (Cardiology) Tressia Danas, MD as Consulting Physician (Gastroenterology) Aura Camps, MD as Consulting Physician (Ophthalmology) Durene Romans, MD as Consulting Physician (Orthopedic  Surgery) Szabat, Vinnie Level, [facility] (Inactive) (Pharmacist)  Indicate any recent Medical Services you may have received from other than [facility] providers in the past year (date may be approximate).     Assessment:   This is a routine wellness examination for Joan [name].  Hearing/Vision screen Hearing Screening - Comments:: no Vision Screening - Comments:: See eye provider when she can   Dietary issues and exercise activities discussed:     Goals Addressed  This Visit's Progress     Patient Stated (pt-stated)        Want get better with walking, work on mobility      Depression Screen    08/30/2022   12:30 PM 08/30/2022   11:15 AM 08/13/2022    2:51 PM 10/12/2021    2:45 PM 08/27/2021    1:42 PM 05/04/2021    1:15 PM 03/27/2021    2:34 PM  PHQ 2/9 Scores  PHQ - 2 Score 0 0 0 2 0 1 0  PHQ- 9 Score    2   0    Fall Risk    08/30/2022   12:24 PM 08/30/2022   11:15 AM 08/13/2022    2:51 PM 08/27/2021    1:48 PM 08/26/2021   11:52 PM  Fall Risk   Falls in the past year? 1 1 0 1 0  Number falls in past yr: 0 0 0 0 0  Injury with Fall? 0 0 0 1 0  Risk for fall due to : History of fall(s) History of fall(s)     Follow up Falls evaluation completed Falls evaluation completed Falls evaluation completed      MEDICARE RISK AT HOME:  Medicare Risk at Home - 08/30/22 1221     Any stairs in or around the home? No    If so, are there any without handrails? No    Home free of loose throw rugs in walkways, pet beds, electrical cords, etc? Yes    Adequate lighting in your home to reduce risk of falls? Yes    Life alert? Yes    Use of a cane, walker or w/c? Yes    Grab bars in the bathroom? Yes    Shower chair or bench in shower? Yes    Elevated toilet seat or a handicapped toilet? Yes             TIMED UP AND GO:  Was the test performed?  No    Cognitive Function:    04/06/2018    3:04 PM  MMSE - Mini Mental State Exam  Orientation to time 5  Orientation to Place 5   Registration 3  Attention/ Calculation 4  Recall 3  Language- name 2 objects 2  Language- repeat 1  Language- follow 3 step command 3  Language- read & follow direction 1  Write a sentence 1  Copy design 1  Total score 29        08/30/2022   12:21 PM 08/27/2021    1:54 PM 05/24/2019    1:43 PM  6CIT Screen  What Year? 0 points 0 points 0 points  What month? 0 points 0 points 0 points  What time? 0 points 0 points 0 points  Count back from 20 0 points 0 points 0 points  Months in reverse 4 points  0 points  Repeat phrase 4 points 2 points 0 points  Total Score 8 points  0 points    Immunizations Immunization History  Administered Date(s) Administered   Influenza,inj,Quad PF,6+ Mos 11/25/2016, 11/11/2017, 11/09/2018, 10/28/2019, 03/15/2021, 11/21/2021   PFIZER(Purple Top)SARS-COV-2 Vaccination 06/04/2019, 06/28/2019   Pfizer Covid-19 Vaccine Bivalent Booster 23yrs & up 04/05/2021   Tdap 12/17/2018    TDAP status: Up to date  Flu Vaccine status: Up to date  Pneumococcal vaccine status: Due, Education has been provided regarding the importance of this vaccine. Advised may receive this vaccine at local pharmacy or Health Dept. Aware to provide a copy  of the vaccination record if obtained from local pharmacy or Health Dept. Verbalized acceptance and understanding.  Covid-19 vaccine status: Completed vaccines  Qualifies for Shingles Vaccine? No   Zostavax completed No   Shingrix Completed?: No.    Education has been provided regarding the importance of this vaccine. Patient has been advised to call insurance company to determine out of pocket expense if they have not yet received this vaccine. Advised may also receive vaccine at local pharmacy or Health Dept. Verbalized acceptance and understanding.  Screening Tests Health Maintenance  Topic Date Due   Zoster Vaccines- Shingrix (1 of 2) Never done   PAP SMEAR-Modifier  Never done   COVID-19 Vaccine (4 - 2023-24 season)  10/26/2021   Diabetic kidney evaluation - Urine ACR  03/27/2022   FOOT EXAM  03/27/2022   HEMOGLOBIN A1C  08/10/2022   OPHTHALMOLOGY EXAM  10/08/2022 (Originally 11/02/1970)   INFLUENZA VACCINE  09/26/2022   Diabetic kidney evaluation - eGFR measurement  01/30/2023   Lung Cancer Screening  05/16/2023   MAMMOGRAM  07/25/2023   Medicare Annual Wellness (AWV)  08/30/2023   Colonoscopy  11/23/2028   DTaP/Tdap/Td (2 - Td or Tdap) 12/16/2028   Hepatitis C Screening  Completed   HIV Screening  Completed   HPV VACCINES  Aged Out    Health Maintenance  Health Maintenance Due  Topic Date Due   Zoster Vaccines- Shingrix (1 of 2) Never done   PAP SMEAR-Modifier  Never done   COVID-19 Vaccine (4 - 2023-24 season) 10/26/2021   Diabetic kidney evaluation - Urine ACR  03/27/2022   FOOT EXAM  03/27/2022   HEMOGLOBIN A1C  08/10/2022    Colorectal cancer screening: Type of screening: Colonoscopy. Completed 11/24/2018. Repeat every 10 years  Mammogram status: Completed 07/24/21. Repeat every year  Bone Density status: Completed 05/13/2019. Results reflect: Bone density results: NORMAL. Repeat every 5 years.  Lung Cancer Screening: (Low Dose CT Chest recommended if Age 64-80 years, 20 pack-year currently smoking OR have quit w/in 15years.) does qualify.   Lung Cancer Screening Referral: N/A  Additional Screening:  Hepatitis C Screening: does qualify; Completed 03/07/2020  Vision Screening: Recommended annual ophthalmology exams for early detection of glaucoma and other disorders of the eye. Is the patient up to date with their annual eye exam?  No  Who is the provider or what is the name of the office in which the patient attends annual eye exams? [facility]  If pt is not established with a provider, would they like to be referred to a provider to establish care? No .   Dental Screening: Recommended annual dental exams for proper oral hygiene  Diabetic Foot Exam: Diabetic Foot Exam:  Overdue, Pt has been advised about the importance in completing this exam. Pt is scheduled for diabetic foot exam on N/A.  Community Resource Referral / Chronic Care Management: CRR required this visit?  No   CCM required this visit?  No     Plan:     I have personally reviewed and noted the following in the patient's chart:   Medical and social history Use of alcohol, tobacco or illicit drugs  Current medications and supplements including opioid prescriptions. Patient is not currently taking opioid prescriptions. Functional ability and status Nutritional status Physical activity Advanced directives List of other physicians Hospitalizations, surgeries, and ER visits in previous 12 months Vitals Screenings to include cognitive, depression, and falls Referrals and appointments  In addition, I have reviewed and discussed with  patient certain preventive protocols, quality metrics, and best practice recommendations. A written personalized care plan for preventive services as well as general preventive health recommendations were provided to patient.     Young Berry Karah Caruthers, CMA   08/30/2022   After Visit Summary: (MyChart) Due to this being a telephonic visit, the after visit summary with patients personalized plan was offered to patient via MyChart   Nurse Notes:  Joan [name] , Thank you for taking time to come for your Medicare Wellness Visit. I appreciate your ongoing commitment to your health goals. Please review the following plan we discussed and let me know if I can assist you in the future.   These are the goals we discussed:  Goals       Manage My Medicine      Timeframe:  Long-Range Goal Priority:  Medium Start Date:   06/28/2021                          Expected End Date:  06/29/2022                     Follow Up Date 10/2021   - call for medicine refill 2 or 3 days before it runs out - call if I am sick and can't take my medicine - keep a list of all the medicines I  take; vitamins and herbals too - learn to read medicine labels    Why is this important?   These steps will help you keep on track with your medicines.   Notes:       Patient Stated (pt-stated)      Want get better with walking, work on mobility        This is a list of the screening recommended for you and due dates:  Health Maintenance  Topic Date Due   Zoster (Shingles) Vaccine (1 of 2) Never done   Pap Smear  Never done   COVID-19 Vaccine (4 - 2023-24 season) 10/26/2021   Yearly kidney health urinalysis for diabetes  03/27/2022   Complete foot exam   03/27/2022   Hemoglobin A1C  08/10/2022   Eye exam for diabetics  10/08/2022*   Flu Shot  09/26/2022   Yearly kidney function blood test for diabetes  01/30/2023   Screening for Lung Cancer  05/16/2023   Mammogram  07/25/2023   Medicare Annual Wellness Visit  08/30/2023   Colon Cancer Screening  11/23/2028   DTaP/Tdap/Td vaccine (2 - Td or Tdap) 12/16/2028   Hepatitis C Screening  Completed   HIV Screening  Completed   HPV Vaccine  Aged Out  *Topic was postponed. The date shown is not the original due date.

## 2022-09-07 ENCOUNTER — Other Ambulatory Visit: Payer: Self-pay

## 2022-09-07 ENCOUNTER — Encounter (HOSPITAL_COMMUNITY): Payer: Self-pay | Admitting: Emergency Medicine

## 2022-09-07 DIAGNOSIS — J449 Chronic obstructive pulmonary disease, unspecified: Secondary | ICD-10-CM | POA: Insufficient documentation

## 2022-09-07 DIAGNOSIS — E039 Hypothyroidism, unspecified: Secondary | ICD-10-CM | POA: Diagnosis not present

## 2022-09-07 DIAGNOSIS — R069 Unspecified abnormalities of breathing: Secondary | ICD-10-CM | POA: Diagnosis not present

## 2022-09-07 DIAGNOSIS — D509 Iron deficiency anemia, unspecified: Secondary | ICD-10-CM | POA: Insufficient documentation

## 2022-09-07 DIAGNOSIS — Z96643 Presence of artificial hip joint, bilateral: Secondary | ICD-10-CM | POA: Insufficient documentation

## 2022-09-07 DIAGNOSIS — Z79899 Other long term (current) drug therapy: Secondary | ICD-10-CM | POA: Insufficient documentation

## 2022-09-07 DIAGNOSIS — J45909 Unspecified asthma, uncomplicated: Secondary | ICD-10-CM | POA: Insufficient documentation

## 2022-09-07 DIAGNOSIS — I10 Essential (primary) hypertension: Secondary | ICD-10-CM | POA: Insufficient documentation

## 2022-09-07 DIAGNOSIS — E876 Hypokalemia: Secondary | ICD-10-CM | POA: Insufficient documentation

## 2022-09-07 DIAGNOSIS — E1140 Type 2 diabetes mellitus with diabetic neuropathy, unspecified: Secondary | ICD-10-CM | POA: Insufficient documentation

## 2022-09-07 DIAGNOSIS — F17210 Nicotine dependence, cigarettes, uncomplicated: Secondary | ICD-10-CM | POA: Insufficient documentation

## 2022-09-07 DIAGNOSIS — E1169 Type 2 diabetes mellitus with other specified complication: Secondary | ICD-10-CM | POA: Insufficient documentation

## 2022-09-07 DIAGNOSIS — L03115 Cellulitis of right lower limb: Secondary | ICD-10-CM | POA: Diagnosis not present

## 2022-09-07 DIAGNOSIS — Z743 Need for continuous supervision: Secondary | ICD-10-CM | POA: Diagnosis not present

## 2022-09-07 DIAGNOSIS — L0390 Cellulitis, unspecified: Secondary | ICD-10-CM | POA: Diagnosis not present

## 2022-09-07 DIAGNOSIS — E785 Hyperlipidemia, unspecified: Secondary | ICD-10-CM | POA: Diagnosis not present

## 2022-09-07 DIAGNOSIS — Z7984 Long term (current) use of oral hypoglycemic drugs: Secondary | ICD-10-CM | POA: Diagnosis not present

## 2022-09-07 DIAGNOSIS — R6889 Other general symptoms and signs: Secondary | ICD-10-CM | POA: Diagnosis not present

## 2022-09-07 DIAGNOSIS — M79673 Pain in unspecified foot: Secondary | ICD-10-CM | POA: Diagnosis not present

## 2022-09-07 DIAGNOSIS — M79604 Pain in right leg: Secondary | ICD-10-CM | POA: Diagnosis present

## 2022-09-07 NOTE — ED Triage Notes (Signed)
Pt bib EMS for c/o pain in feet and legs. Pt also has wounds to Rt foot and toes which she states she is currently taking an antibiotic for. Pt on Hospice for end stage COPD. Wears O2 @ 3L continuously.

## 2022-09-08 ENCOUNTER — Observation Stay (HOSPITAL_COMMUNITY)
Admission: EM | Admit: 2022-09-08 | Discharge: 2022-09-09 | Disposition: A | Discharge status: Home Health | Attending: Internal Medicine | Admitting: Internal Medicine

## 2022-09-08 ENCOUNTER — Emergency Department (HOSPITAL_COMMUNITY)

## 2022-09-08 ENCOUNTER — Inpatient Hospital Stay (HOSPITAL_COMMUNITY)

## 2022-09-08 DIAGNOSIS — E1140 Type 2 diabetes mellitus with diabetic neuropathy, unspecified: Secondary | ICD-10-CM | POA: Diagnosis present

## 2022-09-08 DIAGNOSIS — I10 Essential (primary) hypertension: Secondary | ICD-10-CM | POA: Diagnosis not present

## 2022-09-08 DIAGNOSIS — Z7189 Other specified counseling: Secondary | ICD-10-CM | POA: Diagnosis not present

## 2022-09-08 DIAGNOSIS — S80851A Superficial foreign body, right lower leg, initial encounter: Secondary | ICD-10-CM | POA: Diagnosis not present

## 2022-09-08 DIAGNOSIS — E11649 Type 2 diabetes mellitus with hypoglycemia without coma: Secondary | ICD-10-CM | POA: Diagnosis not present

## 2022-09-08 DIAGNOSIS — J439 Emphysema, unspecified: Secondary | ICD-10-CM | POA: Diagnosis not present

## 2022-09-08 DIAGNOSIS — K219 Gastro-esophageal reflux disease without esophagitis: Secondary | ICD-10-CM | POA: Diagnosis not present

## 2022-09-08 DIAGNOSIS — L03115 Cellulitis of right lower limb: Secondary | ICD-10-CM | POA: Diagnosis not present

## 2022-09-08 DIAGNOSIS — J811 Chronic pulmonary edema: Secondary | ICD-10-CM | POA: Diagnosis not present

## 2022-09-08 DIAGNOSIS — F32A Depression, unspecified: Secondary | ICD-10-CM | POA: Diagnosis present

## 2022-09-08 DIAGNOSIS — F4310 Post-traumatic stress disorder, unspecified: Secondary | ICD-10-CM | POA: Diagnosis present

## 2022-09-08 DIAGNOSIS — R062 Wheezing: Secondary | ICD-10-CM | POA: Diagnosis not present

## 2022-09-08 DIAGNOSIS — E876 Hypokalemia: Secondary | ICD-10-CM | POA: Diagnosis not present

## 2022-09-08 DIAGNOSIS — Z6830 Body mass index (BMI) 30.0-30.9, adult: Secondary | ICD-10-CM | POA: Diagnosis not present

## 2022-09-08 DIAGNOSIS — F17210 Nicotine dependence, cigarettes, uncomplicated: Secondary | ICD-10-CM | POA: Diagnosis not present

## 2022-09-08 DIAGNOSIS — G4733 Obstructive sleep apnea (adult) (pediatric): Secondary | ICD-10-CM | POA: Diagnosis not present

## 2022-09-08 DIAGNOSIS — M79671 Pain in right foot: Secondary | ICD-10-CM | POA: Diagnosis not present

## 2022-09-08 DIAGNOSIS — Z881 Allergy status to other antibiotic agents status: Secondary | ICD-10-CM | POA: Diagnosis not present

## 2022-09-08 DIAGNOSIS — L0390 Cellulitis, unspecified: Secondary | ICD-10-CM | POA: Diagnosis not present

## 2022-09-08 DIAGNOSIS — I499 Cardiac arrhythmia, unspecified: Secondary | ICD-10-CM | POA: Diagnosis not present

## 2022-09-08 DIAGNOSIS — F319 Bipolar disorder, unspecified: Secondary | ICD-10-CM | POA: Diagnosis present

## 2022-09-08 DIAGNOSIS — Z9981 Dependence on supplemental oxygen: Secondary | ICD-10-CM | POA: Diagnosis not present

## 2022-09-08 DIAGNOSIS — I739 Peripheral vascular disease, unspecified: Secondary | ICD-10-CM | POA: Diagnosis present

## 2022-09-08 DIAGNOSIS — Z515 Encounter for palliative care: Secondary | ICD-10-CM | POA: Diagnosis not present

## 2022-09-08 DIAGNOSIS — R519 Headache, unspecified: Secondary | ICD-10-CM | POA: Diagnosis not present

## 2022-09-08 DIAGNOSIS — R0689 Other abnormalities of breathing: Secondary | ICD-10-CM | POA: Diagnosis not present

## 2022-09-08 DIAGNOSIS — J9611 Chronic respiratory failure with hypoxia: Secondary | ICD-10-CM | POA: Diagnosis not present

## 2022-09-08 DIAGNOSIS — E669 Obesity, unspecified: Secondary | ICD-10-CM | POA: Diagnosis present

## 2022-09-08 DIAGNOSIS — E1169 Type 2 diabetes mellitus with other specified complication: Secondary | ICD-10-CM | POA: Diagnosis present

## 2022-09-08 DIAGNOSIS — E782 Mixed hyperlipidemia: Secondary | ICD-10-CM | POA: Diagnosis present

## 2022-09-08 DIAGNOSIS — I700 Atherosclerosis of aorta: Secondary | ICD-10-CM | POA: Diagnosis not present

## 2022-09-08 DIAGNOSIS — S99922A Unspecified injury of left foot, initial encounter: Secondary | ICD-10-CM | POA: Diagnosis not present

## 2022-09-08 DIAGNOSIS — R079 Chest pain, unspecified: Secondary | ICD-10-CM | POA: Diagnosis not present

## 2022-09-08 DIAGNOSIS — F419 Anxiety disorder, unspecified: Secondary | ICD-10-CM | POA: Diagnosis present

## 2022-09-08 DIAGNOSIS — Z91013 Allergy to seafood: Secondary | ICD-10-CM | POA: Diagnosis not present

## 2022-09-08 DIAGNOSIS — M7989 Other specified soft tissue disorders: Secondary | ICD-10-CM | POA: Diagnosis not present

## 2022-09-08 DIAGNOSIS — M1990 Unspecified osteoarthritis, unspecified site: Secondary | ICD-10-CM | POA: Diagnosis present

## 2022-09-08 DIAGNOSIS — S8992XA Unspecified injury of left lower leg, initial encounter: Secondary | ICD-10-CM | POA: Diagnosis not present

## 2022-09-08 DIAGNOSIS — Z0389 Encounter for observation for other suspected diseases and conditions ruled out: Secondary | ICD-10-CM | POA: Diagnosis not present

## 2022-09-08 DIAGNOSIS — R069 Unspecified abnormalities of breathing: Secondary | ICD-10-CM | POA: Diagnosis not present

## 2022-09-08 DIAGNOSIS — J4489 Other specified chronic obstructive pulmonary disease: Secondary | ICD-10-CM | POA: Diagnosis not present

## 2022-09-08 DIAGNOSIS — R6889 Other general symptoms and signs: Secondary | ICD-10-CM | POA: Diagnosis not present

## 2022-09-08 DIAGNOSIS — J449 Chronic obstructive pulmonary disease, unspecified: Secondary | ICD-10-CM | POA: Diagnosis not present

## 2022-09-08 DIAGNOSIS — J441 Chronic obstructive pulmonary disease with (acute) exacerbation: Secondary | ICD-10-CM | POA: Diagnosis not present

## 2022-09-08 DIAGNOSIS — X58XXXA Exposure to other specified factors, initial encounter: Secondary | ICD-10-CM | POA: Diagnosis present

## 2022-09-08 DIAGNOSIS — D508 Other iron deficiency anemias: Secondary | ICD-10-CM | POA: Diagnosis present

## 2022-09-08 DIAGNOSIS — W1830XA Fall on same level, unspecified, initial encounter: Secondary | ICD-10-CM | POA: Diagnosis not present

## 2022-09-08 DIAGNOSIS — E039 Hypothyroidism, unspecified: Secondary | ICD-10-CM | POA: Diagnosis not present

## 2022-09-08 DIAGNOSIS — Z743 Need for continuous supervision: Secondary | ICD-10-CM | POA: Diagnosis not present

## 2022-09-08 DIAGNOSIS — E119 Type 2 diabetes mellitus without complications: Secondary | ICD-10-CM | POA: Diagnosis not present

## 2022-09-08 DIAGNOSIS — R0602 Shortness of breath: Secondary | ICD-10-CM | POA: Diagnosis not present

## 2022-09-08 DIAGNOSIS — F411 Generalized anxiety disorder: Secondary | ICD-10-CM | POA: Diagnosis present

## 2022-09-08 DIAGNOSIS — Z8249 Family history of ischemic heart disease and other diseases of the circulatory system: Secondary | ICD-10-CM | POA: Diagnosis not present

## 2022-09-08 DIAGNOSIS — Z888 Allergy status to other drugs, medicaments and biological substances status: Secondary | ICD-10-CM | POA: Diagnosis not present

## 2022-09-08 LAB — GLUCOSE, CAPILLARY
Glucose-Capillary: 107 mg/dL — ABNORMAL HIGH (ref 70–99)
Glucose-Capillary: 138 mg/dL — ABNORMAL HIGH (ref 70–99)
Glucose-Capillary: 123 mg/dL — ABNORMAL HIGH (ref 70–99)

## 2022-09-08 LAB — COMPREHENSIVE METABOLIC PANEL
ALT: 19 U/L (ref 0–44)
AST: 23 U/L (ref 15–41)
Albumin: 3.4 g/dL — ABNORMAL LOW (ref 3.5–5.0)
Alkaline Phosphatase: 90 U/L (ref 38–126)
Anion gap: 11 (ref 5–15)
BUN: 11 mg/dL (ref 6–20)
CO2: 30 mmol/L (ref 22–32)
Calcium: 8.6 mg/dL — ABNORMAL LOW (ref 8.9–10.3)
Chloride: 94 mmol/L — ABNORMAL LOW (ref 98–111)
Creatinine, Ser: 0.94 mg/dL (ref 0.44–1.00)
GFR, Estimated: >60 mL/min (ref >60)
Glucose, Bld: 124 mg/dL — ABNORMAL HIGH (ref 70–99)
Potassium: 2.6 mmol/L — CL (ref 3.5–5.1)
Sodium: 135 mmol/L (ref 135–145)
Total Bilirubin: 0.5 mg/dL (ref 0.3–1.2)
Total Protein: 6.1 g/dL — ABNORMAL LOW (ref 6.5–8.1)

## 2022-09-08 LAB — CBC
HCT: 36 % (ref 36.0–46.0)
Hemoglobin: 11.9 g/dL — ABNORMAL LOW (ref 12.0–15.0)
MCH: 29 pg (ref 26.0–34.0)
MCHC: 33.1 g/dL (ref 30.0–36.0)
MCV: 87.6 fL (ref 80.0–100.0)
Platelets: 218 10*3/uL (ref 150–400)
RBC: 4.11 MIL/uL (ref 3.87–5.11)
RDW: 17.1 % — ABNORMAL HIGH (ref 11.5–15.5)
WBC: 8.8 10*3/uL (ref 4.0–10.5)
nRBC: 0 % (ref 0.0–0.2)

## 2022-09-08 LAB — MAGNESIUM: Magnesium: 1.8 mg/dL (ref 1.7–2.4)

## 2022-09-08 MED ORDER — IPRATROPIUM-ALBUTEROL 0.5-2.5 (3) MG/3ML IN SOLN
3.0000 mL | Freq: Four times a day (QID) | RESPIRATORY_TRACT | Status: DC | PRN
  Administered 2022-09-08: 3 mL via RESPIRATORY_TRACT
  Filled 2022-09-08: qty 3

## 2022-09-08 MED ORDER — ONDANSETRON HCL 4 MG/2ML IJ SOLN: 4.0000 mg | Freq: Four times a day (QID) | INTRAMUSCULAR | Status: DC | PRN

## 2022-09-08 MED ORDER — INSULIN ASPART 100 UNIT/ML IJ SOLN: 0.0000 [IU] | Freq: Every day | INTRAMUSCULAR | Status: DC

## 2022-09-08 MED ORDER — ACETAMINOPHEN 325 MG PO TABS: 650.0000 mg | ORAL_TABLET | Freq: Four times a day (QID) | ORAL | Status: DC | PRN

## 2022-09-08 MED ORDER — CYCLOBENZAPRINE HCL 10 MG PO TABS
10.0000 mg | ORAL_TABLET | Freq: Every day | ORAL | Status: DC
  Administered 2022-09-08: 10 mg via ORAL
  Filled 2022-09-08: qty 1

## 2022-09-08 MED ORDER — SODIUM CHLORIDE 0.9 % IV SOLN
1.0000 g | INTRAVENOUS | Status: DC
  Administered 2022-09-09: 1 g via INTRAVENOUS
  Filled 2022-09-08: qty 10

## 2022-09-08 MED ORDER — BUSPIRONE HCL 5 MG PO TABS
5.0000 mg | ORAL_TABLET | Freq: Every day | ORAL | Status: DC
  Administered 2022-09-08: 5 mg via ORAL
  Filled 2022-09-08: qty 1

## 2022-09-08 MED ORDER — FENTANYL CITRATE PF 50 MCG/ML IJ SOSY
12.5000 ug | PREFILLED_SYRINGE | INTRAMUSCULAR | Status: DC | PRN
  Administered 2022-09-08 (×2): 50 ug via INTRAVENOUS
  Filled 2022-09-08 (×2): qty 1

## 2022-09-08 MED ORDER — ROFLUMILAST 500 MCG PO TABS
500.0000 ug | ORAL_TABLET | Freq: Every day | ORAL | Status: DC
  Administered 2022-09-08 – 2022-09-09 (×2): 500 ug via ORAL
  Filled 2022-09-08 (×2): qty 1

## 2022-09-08 MED ORDER — VENLAFAXINE HCL ER 37.5 MG PO CP24
150.0000 mg | ORAL_CAPSULE | Freq: Every day | ORAL | Status: DC
  Administered 2022-09-08: 150 mg via ORAL
  Filled 2022-09-08 (×4): qty 4

## 2022-09-08 MED ORDER — ATORVASTATIN CALCIUM 10 MG PO TABS
20.0000 mg | ORAL_TABLET | Freq: Every day | ORAL | Status: DC
  Administered 2022-09-08 – 2022-09-09 (×2): 20 mg via ORAL
  Filled 2022-09-08 (×2): qty 2

## 2022-09-08 MED ORDER — INSULIN ASPART 100 UNIT/ML IJ SOLN
0.0000 [IU] | Freq: Three times a day (TID) | INTRAMUSCULAR | Status: DC
  Administered 2022-09-09: 1 [IU] via SUBCUTANEOUS

## 2022-09-08 MED ORDER — POTASSIUM CHLORIDE CRYS ER 20 MEQ PO TBCR
40.0000 meq | EXTENDED_RELEASE_TABLET | Freq: Once | ORAL | Status: AC
  Administered 2022-09-08: 40 meq via ORAL
  Filled 2022-09-08: qty 2

## 2022-09-08 MED ORDER — POLYETHYLENE GLYCOL 3350 17 G PO PACK: 17.0000 g | PACK | Freq: Every day | ORAL | Status: DC | PRN

## 2022-09-08 MED ORDER — VANCOMYCIN HCL IN DEXTROSE 1-5 GM/200ML-% IV SOLN
1000.0000 mg | Freq: Two times a day (BID) | INTRAVENOUS | Status: DC
  Administered 2022-09-08 – 2022-09-09 (×2): 1000 mg via INTRAVENOUS
  Filled 2022-09-08: qty 200

## 2022-09-08 MED ORDER — ACETAMINOPHEN 650 MG RE SUPP: 650.0000 mg | Freq: Four times a day (QID) | RECTAL | Status: DC | PRN

## 2022-09-08 MED ORDER — ENOXAPARIN SODIUM 40 MG/0.4ML IJ SOSY
40.0000 mg | PREFILLED_SYRINGE | INTRAMUSCULAR | Status: DC
  Filled 2022-09-08: qty 0.4

## 2022-09-08 MED ORDER — ONDANSETRON HCL 4 MG PO TABS: 4.0000 mg | ORAL_TABLET | Freq: Four times a day (QID) | ORAL | Status: DC | PRN

## 2022-09-08 MED ORDER — QUETIAPINE FUMARATE 100 MG PO TABS
100.0000 mg | ORAL_TABLET | Freq: Every day | ORAL | Status: DC
  Administered 2022-09-08: 100 mg via ORAL
  Filled 2022-09-08: qty 1

## 2022-09-08 MED ORDER — PREGABALIN 75 MG PO CAPS
300.0000 mg | ORAL_CAPSULE | Freq: Two times a day (BID) | ORAL | Status: DC
  Administered 2022-09-08 (×2): 300 mg via ORAL
  Filled 2022-09-08 (×3): qty 4

## 2022-09-08 MED ORDER — MONTELUKAST SODIUM 10 MG PO TABS
10.0000 mg | ORAL_TABLET | Freq: Every day | ORAL | Status: DC
  Administered 2022-09-08: 10 mg via ORAL
  Filled 2022-09-08: qty 1

## 2022-09-08 MED ORDER — POTASSIUM CHLORIDE 10 MEQ/100ML IV SOLN
INTRAVENOUS | Status: AC
  Filled 2022-09-08: qty 200

## 2022-09-08 MED ORDER — TRAZODONE HCL 50 MG PO TABS
100.0000 mg | ORAL_TABLET | Freq: Every day | ORAL | Status: DC
  Administered 2022-09-08: 100 mg via ORAL
  Filled 2022-09-08: qty 2

## 2022-09-08 MED ORDER — MORPHINE SULFATE (PF) 2 MG/ML IV SOLN
4.0000 mg | Freq: Once | INTRAVENOUS | Status: AC
  Administered 2022-09-08: 4 mg via INTRAVENOUS
  Filled 2022-09-08: qty 2

## 2022-09-08 MED ORDER — ATENOLOL 25 MG PO TABS
25.0000 mg | ORAL_TABLET | Freq: Every day | ORAL | Status: DC
  Administered 2022-09-09: 25 mg via ORAL
  Filled 2022-09-08 (×2): qty 1

## 2022-09-08 MED ORDER — BUPROPION HCL ER (XL) 150 MG PO TB24
300.0000 mg | ORAL_TABLET | Freq: Every day | ORAL | Status: DC
  Administered 2022-09-08: 300 mg via ORAL
  Filled 2022-09-08 (×2): qty 2

## 2022-09-08 MED ORDER — VANCOMYCIN HCL 1500 MG/300ML IV SOLN
1500.0000 mg | Freq: Once | INTRAVENOUS | Status: AC
  Administered 2022-09-08: 1500 mg via INTRAVENOUS
  Filled 2022-09-08: qty 300

## 2022-09-08 MED ORDER — POTASSIUM CHLORIDE 10 MEQ/100ML IV SOLN
10.0000 meq | INTRAVENOUS | Status: AC
  Administered 2022-09-08 (×4): 10 meq via INTRAVENOUS
  Filled 2022-09-08 (×2): qty 100

## 2022-09-08 MED ORDER — PANTOPRAZOLE SODIUM 40 MG PO TBEC
40.0000 mg | DELAYED_RELEASE_TABLET | Freq: Every day | ORAL | Status: DC
  Administered 2022-09-08 – 2022-09-09 (×2): 40 mg via ORAL
  Filled 2022-09-08 (×2): qty 1

## 2022-09-08 MED ORDER — DIAZEPAM 5 MG PO TABS
5.0000 mg | ORAL_TABLET | Freq: Three times a day (TID) | ORAL | Status: DC | PRN
  Administered 2022-09-08: 5 mg via ORAL
  Filled 2022-09-08: qty 1

## 2022-09-08 MED ORDER — LEVOTHYROXINE SODIUM 50 MCG PO TABS
25.0000 ug | ORAL_TABLET | Freq: Every day | ORAL | Status: DC
  Administered 2022-09-08 – 2022-09-09 (×2): 25 ug via ORAL
  Filled 2022-09-08 (×2): qty 1

## 2022-09-08 MED ORDER — SODIUM CHLORIDE 0.9 % IV SOLN
2.0000 g | Freq: Once | INTRAVENOUS | Status: AC
  Administered 2022-09-08: 2 g via INTRAVENOUS
  Filled 2022-09-08: qty 20

## 2022-09-08 MED ORDER — SODIUM CHLORIDE 0.9 % IV BOLUS
500.0000 mL | Freq: Once | INTRAVENOUS | Status: AC
  Administered 2022-09-08: 500 mL via INTRAVENOUS

## 2022-09-08 MED ORDER — ALBUTEROL SULFATE (2.5 MG/3ML) 0.083% IN NEBU
2.5000 mg | INHALATION_SOLUTION | Freq: Two times a day (BID) | RESPIRATORY_TRACT | Status: DC
  Administered 2022-09-09: 2.5 mg via RESPIRATORY_TRACT
  Filled 2022-09-08 (×2): qty 3

## 2022-09-08 MED ORDER — OLOPATADINE HCL 0.1 % OP SOLN
1.0000 [drp] | Freq: Two times a day (BID) | OPHTHALMIC | Status: DC
  Administered 2022-09-08 – 2022-09-09 (×3): 1 [drp] via OPHTHALMIC
  Filled 2022-09-08: qty 5

## 2022-09-08 NOTE — Progress Notes (Signed)
ED Pharmacy Antibiotic Sign Off An antibiotic consult was received from an ED provider for Vancomycin per pharmacy dosing for cellulitis. A chart review was completed to assess appropriateness.   The following one time order(s) were placed:  Vancomycin 1500 mg IV   Further antibiotic and/or antibiotic pharmacy consults should be ordered by the admitting provider if indicated.   Thank you for allowing pharmacy to be a part of this patient's care.   Eddie Candle, [facility]  Clinical Pharmacist 09/08/22 5:47 AM

## 2022-09-08 NOTE — Progress Notes (Addendum)
Pt arrived to the floor complaining with abdominal pain radiates to her lower back and she is having difficulty with urinating she isn't able to go she says she said she last urinated yesterday but can't go now. BP 108/69 HR 61 O2 98% Oral 97.5. bladder scanned > 500 retained in bladder. MD informed. Orders for in and out cath placed. Fentanyl given for pain with successful decrease in pain.

## 2022-09-08 NOTE — Progress Notes (Signed)
Patient voided on her own without having to do the in and out cath per tech. MD made aware.

## 2022-09-08 NOTE — Progress Notes (Signed)
Tech called this nurse to patients room. Patient complains with right lower extremity pain describes this pain to be uncomfortable. It is not warm to touch. She is finishing up her last bag of potassium. MD made aware, awaiting response.

## 2022-09-08 NOTE — H&P (Addendum)
History and Physical    Joan [name] ZOX:[id number] DOB: [birth year] DOA: 09/08/2022  PCP: Myrlene Broker, MD   Patient coming from: Home  Chief Complaint: R foot pain  HPI: Joan [name] is a 62 y.o. female with medical history significant for terminal COPD under hospice care, OSA on CPAP, obesity, hypertension, dyslipidemia, type 2 diabetes, and more who presented to the ED with complaints of pain worse in her right foot.  Apparently a piece of furniture fell on her legs a couple weeks ago and she noted some increased redness and swelling as well as pain.  She was prescribed Levaquin for suspected cellulitis and had been taking it for the past few days with no improvement which prompted the ED visit.  She is wheelchair-bound and uses a motorized wheelchair and said that a recliner tipped over onto her legs causing the injury.   ED Course: Vital signs stable and patient afebrile.  Laboratory data demonstrating severe hypokalemia with potassium 2.6 and hemoglobin 11.9.  Imaging of lower extremities via x-rays with no significant findings aside from foreign body to right distal tibiotalar joint.  Patient has been given some IV fluid and Rocephin and vancomycin in the ED.  Review of Systems: Reviewed as noted above, otherwise negative.  Past Medical History:  Diagnosis Date   Acute respiratory failure with hypoxia (HCC)    Alcoholism and drug addiction in family    Anxiety    Appendicitis    Arthritis    "knees, hips, lower back" (09/25/2016)   Asthma    Bipolar disorder (HCC)    Chronic bronchitis (HCC)    "all the time" (09/25/2016)   Chronic leg pain    RLE   Chronic lower back pain    COPD (chronic obstructive pulmonary disease) (HCC)    Depression    Diabetes mellitus without complication (HCC)    Diabetic neuropathy (HCC)    Early cataracts, bilateral    Emphysema of lung (HCC)    Generalized anxiety disorder 02/09/2020   GERD (gastroesophageal reflux disease)     Headache    Hepatitis C    "treated back in 2001-2002"   High cholesterol    Hypertension    Hypothyroidism    Incarcerated ventral hernia 04/04/2017   Mood disorder in conditions classified elsewhere 02/09/2020   On home oxygen therapy    "2L; at nighttime" (04/04/2017)   OSA on CPAP    CPAP with Oxygen 3 liters   Paranoid (HCC)    Pneumonia    "all the time" (09/25/2016)   Positive PPD    with negative chest x ray   PTSD (post-traumatic stress disorder) 02/09/2020   Tachycardia    patient reports with exertion ; denies any other conditions    Thyroid disease    Wears glasses    Wears partial dentures     Past Surgical History:  Procedure Laterality Date   ANTERIOR CERVICAL DECOMP/DISCECTOMY FUSION N/A 02/14/2022   Procedure: Anterior Cervical Decompression/Discectomy Fusion Cervical Three-Cervical Four;  Surgeon: Bedelia Person, MD;  Location: [facility] OR;  Service: Neurosurgery;  Laterality: N/A;  3C   APPENDECTOMY     BACK SURGERY     BIOPSY  11/24/2018   Procedure: BIOPSY;  Surgeon: Tressia Danas, MD;  Location: [facility] ENDOSCOPY;  Service: Gastroenterology;;   CARDIAC CATHETERIZATION     COLONOSCOPY     COLONOSCOPY WITH PROPOFOL N/A 11/24/2018   Procedure: COLONOSCOPY WITH PROPOFOL;  Surgeon: Tressia Danas, MD;  Location: [facility];  Service: Gastroenterology;  Laterality: N/A;   CYST EXCISION     removal of cyst in sinuses   DILATION AND CURETTAGE OF UTERUS     ESOPHAGOGASTRODUODENOSCOPY (EGD) WITH PROPOFOL N/A 11/24/2018   Procedure: ESOPHAGOGASTRODUODENOSCOPY (EGD) WITH PROPOFOL;  Surgeon: Tressia Danas, MD;  Location: [facility];  Service: Gastroenterology;  Laterality: N/A;   FOOT SURGERY Bilateral    bone removed from 4th and 5 th toes and put in pin then took pin out   HERNIA REPAIR     INCISION AND DRAINAGE  ~ 2014/2015   "removed mesh & infection"   INCISIONAL HERNIA REPAIR N/A 04/04/2017   Procedure: LAPAROSCOPIC INCISIONAL HERNIA REPAIR WITH  MESH;  Surgeon: Claud Kelp, MD;  Location: [facility] OR;  Service: General;  Laterality: N/A;   LACERATION REPAIR Right    repair wrist artery from laceration from ice skate   LAPAROSCOPIC APPENDECTOMY N/A 03/31/2019   Procedure: APPENDECTOMY LAPAROSCOPIC;  Surgeon: Axel Filler, MD;  Location: [facility] ORS;  Service: General;  Laterality: N/A;   LAPAROSCOPIC APPENDECTOMY N/A 07/09/2019   Procedure: APPENDECTOMY LAPAROSCOPIC;  Surgeon: Axel Filler, MD;  Location: [facility] OR;  Service: General;  Laterality: N/A;   LAPAROSCOPIC INCISIONAL / UMBILICAL / VENTRAL HERNIA REPAIR  04/04/2017   LAPAROSCOPIC INCISIONAL HERNIA REPAIR WITH MESH   LIPOMA EXCISION Right    fattty lipoma in neck   NASAL SEPTUM SURGERY     POSTERIOR LUMBAR FUSION  2015/2016 X 2   "added rods and screws"   SINOSCOPY     TOTAL HIP ARTHROPLASTY Right 02/05/2017   Procedure: RIGHT TOTAL HIP ARTHROPLASTY;  Surgeon: Ranee Gosselin, MD;  Location: [facility] ORS;  Service: Orthopedics;  Laterality: Right;   TOTAL HIP ARTHROPLASTY Left 06/18/2017   Procedure: LEFT TOTAL HIP ARTHROPLASTY;  Surgeon: Ranee Gosselin, MD;  Location: [facility] ORS;  Service: Orthopedics;  Laterality: Left;   TOTAL HIP REVISION Left 01/26/2019   Procedure: TOTAL HIP REVISION;  Surgeon: Durene Romans, MD;  Location: [facility] ORS;  Service: Orthopedics;  Laterality: Left;  2 hrs   TUBAL LIGATION     UMBILICAL HERNIA REPAIR  ~ 2013   w/mesh   UPPER GI ENDOSCOPY       reports that she has been smoking cigarettes. She started smoking about 47 years ago. She has a 95.1 pack-year smoking history. She has been exposed to tobacco smoke. She has never used smokeless tobacco. She reports that she does not currently use alcohol. She reports that she does not currently use drugs after having used the following drugs: "Crack" cocaine.  Allergies  Allergen Reactions   Keflex [Cephalexin] Rash and Other (See Comments)    Has tolerated amoxicillin since had keflex   Shellfish Allergy  Shortness Of Breath, Nausea And Vomiting and Other (See Comments)    Stomach cramps   Tetracyclines & Related Anaphylaxis, Swelling and Other (See Comments)    Throat swelling requiring hospitalization   Dilaudid [Hydromorphone Hcl] Other (See Comments)    "Lethargy"   Prednisone Other (See Comments)    "counter reacts", has tolerated IV medrol   Clindamycin/Lincomycin Other (See Comments)    Burning sensation in her mouth down her throat    Incruse Ellipta [Umeclidinium Bromide] Nausea Only   Molds & Smuts Itching   Tuberculin Tests Other (See Comments)    False postive   Umeclidinium     Other Reaction(s): GI Intolerance    Family History  Problem Relation Age of Onset   Diabetes Mother    Hypertension Mother  Hypertension Father    Cancer Father        lung   Breast cancer Neg Hx     Prior to Admission medications   Medication Sig Start Date End Date Taking? Authorizing Provider  Accu-Chek Softclix Lancets lancets Use as instructed 05/21/21   Myrlene Broker, MD  acyclovir (ZOVIRAX) 400 MG tablet TAKE 1 TABLET BY MOUTH TWICE  DAILY 08/14/22   Myrlene Broker, MD  albuterol (PROVENTIL) (2.5 MG/3ML) 0.083% nebulizer solution Take 3 mLs (2.5 mg total) by nebulization 2 (two) times daily. And every 6 hours as needed.  DX: J44.9 01/05/21   Bevelyn Ngo, NP  atenolol (TENORMIN) 25 MG tablet TAKE 1 TABLET BY MOUTH  DAILY 01/28/22   Myrlene Broker, MD  atorvastatin (LIPITOR) 20 MG tablet TAKE 1 TABLET BY MOUTH ONCE  DAILY 07/23/22   Myrlene Broker, MD  azithromycin (ZITHROMAX) 250 MG tablet Take two today and then one daily until finished. 07/30/22   Oretha Milch, MD  Blood Glucose Monitoring Suppl DEVI 1 each by Does not apply route in the morning, at noon, and at bedtime. May substitute to any manufacturer covered by patient's insurance. 08/28/22   Myrlene Broker, MD  Budeson-Glycopyrrol-Formoterol (BREZTRI AEROSPHERE) 160-9-4.8 MCG/ACT AERO Inhale 2  puffs into the lungs in the morning and at bedtime. 05/08/21   Oretha Milch, MD  buPROPion (WELLBUTRIN XL) 300 MG 24 hr tablet TAKE 1 TABLET BY MOUTH DAILY 06/04/22   Thresa Ross, MD  busPIRone (BUSPAR) 5 MG tablet Take 1 tablet (5 mg total) by mouth at bedtime. 06/17/22   Thresa Ross, MD  Camphor-Menthol-Methyl Sal (SALONPAS) 3.03-02-08 % PTCH Place 1 patch onto the skin daily as needed (neck pain).    [provider]  cetirizine (ZYRTEC) 10 MG tablet Take 1 tablet (10 mg total) by mouth 2 (two) times daily. Patient taking differently: Take 10 mg by mouth daily. 08/09/21 02/14/22  Alfonse Spruce, MD  cyclobenzaprine (FLEXERIL) 10 MG tablet TAKE 1 TABLET BY MOUTH AT  BEDTIME 07/31/22   Myrlene Broker, MD  diazepam (VALIUM) 5 MG tablet Take 1 tablet (5 mg total) by mouth every 8 (eight) hours as needed for anxiety. 02/15/22 02/15/23  Bedelia Person, MD  diphenhydrAMINE (BENADRYL) 25 mg capsule Take 25 mg by mouth 2 (two) times daily.    [provider]  fluticasone (FLONASE) 50 MCG/ACT nasal spray Place 2 sprays into both nostrils daily. Patient taking differently: Place 2 sprays into both nostrils daily as needed for allergies. 09/05/21 02/07/22  Alfonse Spruce, MD  Glucose Blood (BLOOD GLUCOSE TEST STRIPS) STRP 1 each by In Vitro route in the morning, at noon, and at bedtime. May substitute to any manufacturer covered by patient's insurance. 08/28/22 09/27/22  Myrlene Broker, MD  guaiFENesin-Codeine 225-7.5 MG/5ML LIQD Take 5 mLs by mouth 3 (three) times daily as needed. 07/30/22   Oretha Milch, MD  hydrochlorothiazide (HYDRODIURIL) 25 MG tablet TAKE 1 TABLET BY MOUTH DAILY 07/23/22   Myrlene Broker, MD  Ipratropium-Albuterol (COMBIVENT RESPIMAT) 20-100 MCG/ACT AERS respimat Inhale 1 puff into the lungs every 6 (six) hours as needed for wheezing or shortness of breath. 04/02/22   Bevelyn Ngo, NP  Lancet Device MISC 1 each by Does not apply route in  the morning, at noon, and at bedtime. May substitute to any manufacturer covered by patient's insurance. 08/13/22 09/12/22  Myrlene Broker, MD  Lancets Misc. MISC 1  each by Does not apply route in the morning, at noon, and at bedtime. May substitute to any manufacturer covered by patient's insurance. 08/28/22 09/27/22  Myrlene Broker, MD  levothyroxine (SYNTHROID) 25 MCG tablet TAKE 1 TABLET BY MOUTH DAILY  BEFORE BREAKFAST 07/23/22   Myrlene Broker, MD  MELATONIN PO Take 1 tablet by mouth at bedtime as needed (sleep).    [provider]  montelukast (SINGULAIR) 10 MG tablet TAKE 1 TABLET BY MOUTH AT  BEDTIME 07/23/22   Oretha Milch, MD  naproxen (NAPROSYN) 500 MG tablet Take 1 tablet (500 mg total) by mouth 2 (two) times daily with a meal. 03/29/22   Myrlene Broker, MD  nystatin-triamcinolone ointment [facility]) Apply 1 application topically 2 (two) times daily as needed (applied to skin folds/groins/under breast skin irritation rash.). 09/08/17   Myrlene Broker, MD  OXYGEN Inhale 3 L/min into the lungs See admin instructions. Inhale  3 L/min of oxygen into the lungs w/CPAP at bedtime and as needed for shortness of breath with "strenuous activity" during the day    [provider]  pantoprazole (PROTONIX) 40 MG tablet TAKE 1 TABLET BY MOUTH DAILY 12/21/21   Myrlene Broker, MD  PATADAY 0.2 % SOLN Place 1 drop into both eyes daily. 08/20/19   [provider]  polyethylene glycol (MIRALAX / GLYCOLAX) 17 g packet Take 17 g by mouth daily as needed for mild constipation. 04/14/19   Meuth, Brooke A, PA-C  pregabalin (LYRICA) 300 MG capsule Take 1 capsule (300 mg total) by mouth 2 (two) times daily. 03/29/22   Myrlene Broker, MD  QUEtiapine (SEROQUEL) 100 MG tablet TAKE 1 TABLET BY MOUTH AT  BEDTIME 07/31/22   Thresa Ross, MD  roflumilast (DALIRESP) 500 MCG TABS tablet Take 1 tablet (500 mcg total) by mouth daily. 04/02/22   Bevelyn Ngo, NP   traZODone (DESYREL) 100 MG tablet TAKE 1 TABLET BY MOUTH AT  BEDTIME 12/21/21   Myrlene Broker, MD  triamcinolone cream (KENALOG) 0.1 % Apply 1 Application topically 2 (two) times daily. 08/20/22   Myrlene Broker, MD  Varenicline Tartrate, Starter, (CHANTIX STARTING MONTH PAK) 0.5 MG X 11 & 1 MG X 42 TBPK Chantix started pack Take 0.5 mg once daily x 3 days, then take 0.5 mg twice daily x 4 days, then take 1 mg twice daily thereafter. 04/12/22   Bevelyn Ngo, NP  venlafaxine XR (EFFEXOR-XR) 150 MG 24 hr capsule TAKE 1 CAPSULE BY MOUTH DAILY  WITH BREAKFAST 12/21/21   Myrlene Broker, MD  witch hazel-glycerin (TUCKS) pad Apply topically as needed for itching. 08/18/20   Standley Brooking, MD    Physical Exam: Vitals:   09/07/22 2346 09/07/22 2348 09/08/22 0751  BP: 123/60  (!) 109/96  Pulse: 71  (!) 53  Resp: 18  18  Temp: 98.3 F (36.8 C)    TempSrc: Oral    SpO2: 98%  100%  Weight:  91 kg   Height:  5\' 8"  (1.727 m)     Constitutional: NAD, calm, comfortable, obese Vitals:   09/07/22 2346 09/07/22 2348 09/08/22 0751  BP: 123/60  (!) 109/96  Pulse: 71  (!) 53  Resp: 18  18  Temp: 98.3 F (36.8 C)    TempSrc: Oral    SpO2: 98%  100%  Weight:  91 kg   Height:  5\' 8"  (1.727 m)    Eyes: lids and conjunctivae normal Neck: normal, supple  Respiratory: clear to auscultation bilaterally. Normal respiratory effort. No accessory muscle use.  Currently on nasal cannula oxygen Cardiovascular: Regular rate and rhythm, no murmurs. Abdomen: no tenderness, no distention. Bowel sounds positive.  Musculoskeletal: Bilateral normal lower extremity erythema right greater than left over dorsum of foot with some nonhealing ulcerations.  Tender to palpation throughout. Skin: no rashes, lesions, ulcers.  Psychiatric: Flat affect  Labs on Admission: I have personally reviewed following labs and imaging studies  CBC: Recent Labs  Lab 09/08/22 0325  WBC 8.8  HGB 11.9*  HCT  36.0  MCV 87.6  PLT 218   Basic Metabolic Panel: Recent Labs  Lab 09/08/22 0325  NA 135  K 2.6*  CL 94*  CO2 30  GLUCOSE 124*  BUN 11  CREATININE 0.94  CALCIUM 8.6*   GFR: Estimated Creatinine Clearance: 75.1 mL/min (by C-G formula based on SCr of 0.94 mg/dL). Liver Function Tests: Recent Labs  Lab 09/08/22 0325  AST 23  ALT 19  ALKPHOS 90  BILITOT 0.5  PROT 6.1*  ALBUMIN 3.4*   No results for input(s): "LIPASE", "AMYLASE" in the last 168 hours. No results for input(s): "AMMONIA" in the last 168 hours. Coagulation Profile: No results for input(s): "INR", "PROTIME" in the last 168 hours. Cardiac Enzymes: No results for input(s): "CKTOTAL", "CKMB", "CKMBINDEX", "TROPONINI" in the last 168 hours. BNP (last 3 results) No results for input(s): "PROBNP" in the last 8760 hours. HbA1C: No results for input(s): "HGBA1C" in the last 72 hours. CBG: No results for input(s): "GLUCAP" in the last 168 hours. Lipid Profile: No results for input(s): "CHOL", "HDL", "LDLCALC", "TRIG", "CHOLHDL", "LDLDIRECT" in the last 72 hours. Thyroid Function Tests: No results for input(s): "TSH", "T4TOTAL", "FREET4", "T3FREE", "THYROIDAB" in the last 72 hours. Anemia Panel: No results for input(s): "VITAMINB12", "FOLATE", "FERRITIN", "TIBC", "IRON", "RETICCTPCT" in the last 72 hours. Urine analysis:    Component Value Date/Time   COLORURINE YELLOW 03/30/2019 1838   APPEARANCEUR CLEAR 03/30/2019 1838   LABSPEC 1.045 (H) 03/30/2019 1838   PHURINE 7.0 03/30/2019 1838   GLUCOSEU NEGATIVE 03/30/2019 1838   GLUCOSEU NEGATIVE 08/05/2017 1652   HGBUR SMALL (A) 03/30/2019 1838   BILIRUBINUR NEGATIVE 03/30/2019 1838   KETONESUR NEGATIVE 03/30/2019 1838   PROTEINUR NEGATIVE 03/30/2019 1838   UROBILINOGEN 0.2 08/05/2017 1652   NITRITE NEGATIVE 03/30/2019 1838   LEUKOCYTESUR NEGATIVE 03/30/2019 1838    Radiological Exams on Admission: DG Foot Complete Right  Result Date: 09/08/2022 CLINICAL  DATA:  Right foot pain and cellulitis, initial encounter EXAM: RIGHT FOOT COMPLETE - 3+ VIEW COMPARISON:  None Available. FINDINGS: Mild soft tissue swelling is noted over the metatarsals. Radiopaque density is again seen posterior to the tibiotalar joint. Irregularity in the medial talar dome is noted of uncertain chronicity. IMPRESSION: Stable radiopaque density posterior to the tibiotalar joint. Irregularity of the medial aspect of the talar dome poorly visualized on prior tibia and fibula films. This may be related to chronic osteochondral defect. Electronically Signed   By: Alcide Clever M.D.   On: 09/08/2022 03:16   DG Foot Complete Left  Result Date: 09/08/2022 CLINICAL DATA:  Recent trauma with foot swelling, initial encounter EXAM: LEFT FOOT - COMPLETE 3+ VIEW COMPARISON:  None Available. FINDINGS: Mild deformity of the distal aspect of the fifth proximal phalanx likely related to prior trauma healing. No soft tissue abnormality is seen. No acute fracture is noted. IMPRESSION: No acute abnormality noted. Electronically Signed   By: Eulah Pont.D.  On: 09/08/2022 03:14   DG Tibia/Fibula Left  Result Date: 09/08/2022 CLINICAL DATA:  Recent trauma with cellulitis, initial encounter EXAM: LEFT TIBIA AND FIBULA - 2 VIEW COMPARISON:  None Available. FINDINGS: No acute fracture or dislocation is noted. No soft tissue abnormality is seen. IMPRESSION: No acute abnormality noted. Electronically Signed   By: Alcide Clever M.D.   On: 09/08/2022 03:13   DG Tibia/Fibula Right  Result Date: 09/08/2022 CLINICAL DATA:  Recent trauma with cellulitis, initial encounter EXAM: RIGHT TIBIA AND FIBULA - 2 VIEW COMPARISON:  None Available. FINDINGS: No acute fracture or dislocation is noted. No soft tissue abnormality is seen. Linear density is noted posterior to the talar tibial joint which may represent a radiopaque foreign body. IMPRESSION: Distal right leg foreign body posterior to the tibiotalar joint.  Electronically Signed   By: Alcide Clever M.D.   On: 09/08/2022 03:13    Assessment/Plan Principal Problem:   Cellulitis of right foot Active Problems:   Hypokalemia   Anxiety   Essential hypertension   Iron deficiency anemia due to dietary causes   Osteoarthritis   Hyperlipidemia associated with type 2 diabetes mellitus (HCC)   Gastroesophageal reflux disease   OSA on CPAP   Hypertension   PTSD (post-traumatic stress disorder)   Depression   Hypothyroidism   Neuropathy due to type 2 diabetes mellitus (HCC)   Chronic obstructive pulmonary disease (HCC)   PAD (peripheral artery disease) (HCC)    Right lower extremity cellulitis with failed outpatient treatment Continue vancomycin and Rocephin as given in the ED Check MRSA PCR Monitor blood cultures, unfortunately not obtained in ED Check ABI  Severe hypokalemia Replete and reevaluate in a.m.  Terminal COPD with chronic hypoxemia Currently under hospice care, but wants to remain full code, consult to palliative care Wears 3 L nasal cannula at baseline  OSA on CPAP CPAP at bedtime  Type 2 diabetes with neuropathy Carb modified diet and SSI  Hypertension Continue home medications  Dyslipidemia Continue statin  Anxiety/depression Continue home medications  Hypothyroidism Continue Synthroid  GERD PPI  Polypharmacy Continue home medications as prior for now  Obesity BMI 30.50  DVT prophylaxis: Lovenox Code Status: Full Family Communication: None at bedside Disposition Plan: Admit for treatment of cellulitis Consults called: Palliative care Admission status: Inpatient, MedSurg  Severity of Illness: The appropriate patient status for this patient is INPATIENT. Inpatient status is judged to be reasonable and necessary in order to provide the required intensity of service to ensure the patient's safety. The patient's presenting symptoms, physical exam findings, and initial radiographic and laboratory data in  the context of their chronic comorbidities is felt to place them at high risk for further clinical deterioration. Furthermore, it is not anticipated that the patient will be medically stable for discharge from the hospital within 2 midnights of admission.   * I certify that at the point of admission it is my clinical judgment that the patient will require inpatient hospital care spanning beyond 2 midnights from the point of admission due to high intensity of service, high risk for further deterioration and high frequency of surveillance required.*   Anhar Mcdermott D Clarise Chacko DO [facility]  If 7PM-7AM, please contact night-coverage [email or web address]  09/08/2022, 7:52 AM

## 2022-09-08 NOTE — Progress Notes (Addendum)
Pharmacy Antibiotic Note  Joan [name] is a 62 y.o. female admitted on 09/08/2022 with right LE cellulitis. Pharmacy has been consulted for vancomycin dosing. Initial doses received in the ER  Plan: Vancomycin 1000 q12 Monitor renal function, cultures, and clinical course (Predicted AUC 529, ke 0.058, Vd 0.72L/kg)  Height: 5\' 8"  (172.7 cm) Weight: 91 kg (200 lb 9.9 oz) IBW/kg (Calculated) : 63.9  Temp (24hrs), Avg:98.3 F (36.8 C), Min:98.3 F (36.8 C), Max:98.3 F (36.8 C)  Recent Labs  Lab 09/08/22 0325  WBC 8.8  CREATININE 0.94    Estimated Creatinine Clearance: 75.1 mL/min (by C-G formula based on SCr of 0.94 mg/dL).    Allergies  Allergen Reactions   Keflex [Cephalexin] Rash and Other (See Comments)    Has tolerated amoxicillin since had keflex   Shellfish Allergy Shortness Of Breath, Nausea And Vomiting and Other (See Comments)    Stomach cramps   Tetracyclines & Related Anaphylaxis, Swelling and Other (See Comments)    Throat swelling requiring hospitalization   Dilaudid [Hydromorphone Hcl] Other (See Comments)    "Lethargy"   Prednisone Other (See Comments)    "counter reacts", has tolerated IV medrol   Clindamycin/Lincomycin Other (See Comments)    Burning sensation in her mouth down her throat    Incruse Ellipta [Umeclidinium Bromide] Nausea Only   Molds & Smuts Itching   Tuberculin Tests Other (See Comments)    False postive   Umeclidinium     Other Reaction(s): GI Intolerance    Antimicrobials this admission: 7/14 rocephin >>   Microbiology results: 7/14 BCx: P 7/14 MRSA PCR: P  Thank you for allowing pharmacy to be a part of this patient's care.  Caryl Asp, PharmD Clinical Pharmacist 09/08/2022 8:43 AM

## 2022-09-08 NOTE — ED Provider Notes (Signed)
AP-EMERGENCY DEPT [facility] Emergency Department Provider Note MRN:  [id number]  Arrival date & time: 09/08/22     Chief Complaint   Pain Legs and Feet Bilaterally   History of Present Illness   Joan [name] is a 62 y.o. year-old female with a history of COPD, diabetes presenting to the ED with chief complaint of leg pain.  Patient had something fall on her legs a couple weeks ago.  She is having continued redness and swelling and discomfort, difficulty walking.  She was prescribed Levaquin and has been taking it for the past few days but does not seem to be helping.  Pain is worst in her right foot.  Denies fever.  Review of Systems  A thorough review of systems was obtained and all systems are negative except as noted in the HPI and PMH.   Patient's Health History    Past Medical History:  Diagnosis Date   Acute respiratory failure with hypoxia (HCC)    Alcoholism and drug addiction in family    Anxiety    Appendicitis    Arthritis    "knees, hips, lower back" (09/25/2016)   Asthma    Bipolar disorder (HCC)    Chronic bronchitis (HCC)    "all the time" (09/25/2016)   Chronic leg pain    RLE   Chronic lower back pain    COPD (chronic obstructive pulmonary disease) (HCC)    Depression    Diabetes mellitus without complication (HCC)    Diabetic neuropathy (HCC)    Early cataracts, bilateral    Emphysema of lung (HCC)    Generalized anxiety disorder 02/09/2020   GERD (gastroesophageal reflux disease)    Headache    Hepatitis C    "treated back in 2001-2002"   High cholesterol    Hypertension    Hypothyroidism    Incarcerated ventral hernia 04/04/2017   Mood disorder in conditions classified elsewhere 02/09/2020   On home oxygen therapy    "2L; at nighttime" (04/04/2017)   OSA on CPAP    CPAP with Oxygen 3 liters   Paranoid (HCC)    Pneumonia    "all the time" (09/25/2016)   Positive PPD    with negative chest x ray   PTSD (post-traumatic stress  disorder) 02/09/2020   Tachycardia    patient reports with exertion ; denies any other conditions    Thyroid disease    Wears glasses    Wears partial dentures     Past Surgical History:  Procedure Laterality Date   ANTERIOR CERVICAL DECOMP/DISCECTOMY FUSION N/A 02/14/2022   Procedure: Anterior Cervical Decompression/Discectomy Fusion Cervical Three-Cervical Four;  Surgeon: Bedelia Person, MD;  Location: [facility] OR;  Service: Neurosurgery;  Laterality: N/A;  3C   APPENDECTOMY     BACK SURGERY     BIOPSY  11/24/2018   Procedure: BIOPSY;  Surgeon: Tressia Danas, MD;  Location: [facility]OSCOPY;  Service: Gastroenterology;;   CARDIAC CATHETERIZATION     COLONOSCOPY     COLONOSCOPY WITH PROPOFOL N/A 11/24/2018   Procedure: COLONOSCOPY WITH PROPOFOL;  Surgeon: Tressia Danas, MD;  Location: [facility];  Service: Gastroenterology;  Laterality: N/A;   CYST EXCISION     removal of cyst in sinuses   DILATION AND CURETTAGE OF UTERUS     ESOPHAGOGASTRODUODENOSCOPY (EGD) WITH PROPOFOL N/A 11/24/2018   Procedure: ESOPHAGOGASTRODUODENOSCOPY (EGD) WITH PROPOFOL;  Surgeon: Tressia Danas, MD;  Location: [facility];  Service: Gastroenterology;  Laterality: N/A;   FOOT SURGERY Bilateral  bone removed from 4th and 5 th toes and put in pin then took pin out   HERNIA REPAIR     INCISION AND DRAINAGE  ~ 2014/2015   "removed mesh & infection"   INCISIONAL HERNIA REPAIR N/A 04/04/2017   Procedure: LAPAROSCOPIC INCISIONAL HERNIA REPAIR WITH MESH;  Surgeon: Claud Kelp, MD;  Location: [facility] OR;  Service: General;  Laterality: N/A;   LACERATION REPAIR Right    repair wrist artery from laceration from ice skate   LAPAROSCOPIC APPENDECTOMY N/A 03/31/2019   Procedure: APPENDECTOMY LAPAROSCOPIC;  Surgeon: Axel Filler, MD;  Location: [facility] ORS;  Service: General;  Laterality: N/A;   LAPAROSCOPIC APPENDECTOMY N/A 07/09/2019   Procedure: APPENDECTOMY LAPAROSCOPIC;  Surgeon: Axel Filler, MD;   Location: [facility] OR;  Service: General;  Laterality: N/A;   LAPAROSCOPIC INCISIONAL / UMBILICAL / VENTRAL HERNIA REPAIR  04/04/2017   LAPAROSCOPIC INCISIONAL HERNIA REPAIR WITH MESH   LIPOMA EXCISION Right    fattty lipoma in neck   NASAL SEPTUM SURGERY     POSTERIOR LUMBAR FUSION  2015/2016 X 2   "added rods and screws"   SINOSCOPY     TOTAL HIP ARTHROPLASTY Right 02/05/2017   Procedure: RIGHT TOTAL HIP ARTHROPLASTY;  Surgeon: Ranee Gosselin, MD;  Location: [facility];  Service: Orthopedics;  Laterality: Right;   TOTAL HIP ARTHROPLASTY Left 06/18/2017   Procedure: LEFT TOTAL HIP ARTHROPLASTY;  Surgeon: Ranee Gosselin, MD;  Location: [facility];  Service: Orthopedics;  Laterality: Left;   TOTAL HIP REVISION Left 01/26/2019   Procedure: TOTAL HIP REVISION;  Surgeon: Durene Romans, MD;  Location: [facility];  Service: Orthopedics;  Laterality: Left;  2 hrs   TUBAL LIGATION     UMBILICAL HERNIA REPAIR  ~ 2013   w/mesh   UPPER GI ENDOSCOPY      Family History  Problem Relation Age of Onset   Diabetes Mother    Hypertension Mother    Hypertension Father    Cancer Father        lung   Breast cancer Neg Hx     Social History   Socioeconomic History   Marital status: Widowed    Spouse name: Not on file   Number of children: 3   Years of education: Not on file   Highest education level: 9th grade  Occupational History   Not on file  Tobacco Use   Smoking status: Every Day    Current packs/day: 2.00    Average packs/day: 2.0 packs/day for 47.5 years (95.1 ttl pk-yrs)    Types: Cigarettes    Start date: 1977    Passive exposure: Current   Smokeless tobacco: Never   Tobacco comments:    1ppd as of 07/30/22 ep  Vaping Use   Vaping status: Never Used  Substance and Sexual Activity   Alcohol use: Not Currently   Drug use: Not Currently    Types: "Crack" cocaine    Comment:  "nothing since 01/12/2012"   Sexual activity: Not Currently    Partners: Male  Other Topics Concern   Not on file   Social History Narrative   Not on file   Social Determinants of Health   Financial Resource Strain: High Risk (08/30/2022)   Overall Financial Resource Strain (CARDIA)    Difficulty of Paying Living Expenses: Hard  Food Insecurity: No Food Insecurity (08/30/2022)   Hunger Vital Sign    Worried About Running Out of Food in the Last Year: Never true    Ran Out of Food  in the Last Year: Never true  Recent Concern: Food Insecurity - Food Insecurity Present (08/12/2022)   Hunger Vital Sign    Worried About Running Out of Food in the Last Year: Sometimes true    Ran Out of Food in the Last Year: Sometimes true  Transportation Needs: Unmet Transportation Needs (08/30/2022)   PRAPARE - Administrator, Civil Service (Medical): Yes    Lack of Transportation (Non-Medical): Yes  Physical Activity: Inactive (08/30/2022)   Exercise Vital Sign    Days of Exercise per Week: 0 days    Minutes of Exercise per Session: 0 min  Stress: No Stress Concern Present (08/30/2022)   [employer] of Occupational Health - Occupational Stress Questionnaire    Feeling of Stress : Only a little  Recent Concern: Stress - Stress Concern Present (08/12/2022)   [employer] of Occupational Health - Occupational Stress Questionnaire    Feeling of Stress : Very much  Social Connections: Moderately Isolated (08/30/2022)   Social Connection and Isolation Panel [NHANES]    Frequency of Communication with Friends and Family: Once a week    Frequency of Social Gatherings with Friends and Family: Once a week    Attends Religious Services: 1 to 4 times per year    Active Member of [employer] or Organizations: Yes    Attends Banker Meetings: 1 to 4 times per year    Marital Status: Widowed  Intimate Partner Violence: Not At Risk (08/30/2022)   Humiliation, Afraid, Rape, and Kick questionnaire    Fear of Current or Ex-Partner: No    Emotionally Abused: No    Physically Abused: No    Sexually Abused: No      Physical Exam   Vitals:   09/07/22 2346  BP: 123/60  Pulse: 71  Resp: 18  Temp: 98.3 F (36.8 C)  SpO2: 98%    CONSTITUTIONAL: Well-appearing, NAD NEURO/PSYCH:  Alert and oriented x 3, no focal deficits EYES:  eyes equal and reactive ENT/NECK:  no LAD, no JVD CARDIO: Regular rate, well-perfused, normal S1 and S2 PULM:  CTAB no wheezing or rhonchi GI/GU:  non-distended, non-tender MSK/SPINE:  No gross deformities, no edema SKIN:  no rash, atraumatic   *Additional and/or pertinent findings included in MDM below  Diagnostic and Interventional Summary    EKG Interpretation Date/Time:    Ventricular Rate:    PR Interval:    QRS Duration:    QT Interval:    QTC Calculation:   R Axis:      Text Interpretation:         Labs Reviewed  CBC - Abnormal; Notable for the following components:      Result Value   Hemoglobin 11.9 (*)    RDW 17.1 (*)    All other components within normal limits  COMPREHENSIVE METABOLIC PANEL - Abnormal; Notable for the following components:   Potassium 2.6 (*)    Chloride 94 (*)    Glucose, Bld 124 (*)    Calcium 8.6 (*)    Total Protein 6.1 (*)    Albumin 3.4 (*)    All other components within normal limits    DG Tibia/Fibula Right  Final Result    DG Tibia/Fibula Left  Final Result    DG Foot Complete Left  Final Result    DG Foot Complete Right  Final Result      Medications  potassium chloride 10 mEq in 100 mL IVPB (has no administration in time range)  potassium chloride SA (KLOR-CON M) CR tablet 40 mEq (has no administration in time range)  sodium chloride 0.9 % bolus 500 mL (0 mLs Intravenous Stopped 09/08/22 0347)  cefTRIAXone (ROCEPHIN) 2 g in sodium chloride 0.9 % 100 mL IVPB (0 g Intravenous Stopped 09/08/22 0347)  morphine (PF) 2 MG/ML injection 4 mg (4 mg Intravenous Given 09/08/22 0304)  vancomycin (VANCOREADY) IVPB 1500 mg/300 mL (0 mg Intravenous Stopped 09/08/22 0552)     Procedures  /  Critical  Care Procedures  ED Course and Medical Decision Making  Initial Impression and Ddx Concern for either underlying fracture or cellulitis that is failing outpatient management.  Patient is concerned about the infection and wants to be admitted.  We discussed goals of care.  Past medical/surgical history that increases complexity of ED encounter: End-stage COPD on hospice  Interpretation of Diagnostics I personally reviewed the laboratory assessment and my interpretation is as follows: Marked hypokalemia, otherwise no significant blood count or electrolyte disturbance.  Imaging without fracture, question foreign body on one of the films, will correlate clinically.  Patient Reassessment and Ultimate Disposition/Management     Admitted to medicine.  Patient management required discussion with the following services or consulting groups:  Hospitalist Service  Complexity of Problems Addressed Acute illness or injury that poses threat of life of bodily function  Additional Data Reviewed and Analyzed Further history obtained from: None  Additional Factors Impacting ED Encounter Risk Consideration of hospitalization  Elmer Sow. Pilar Plate, MD [facility] Emergency Medicine [facility] [email or web address]  Final Clinical Impressions(s) / ED Diagnoses     ICD-10-CM   1. Cellulitis of right lower extremity  L03.115     2. Hypokalemia  E87.6       ED Discharge Orders     None        Discharge Instructions Discussed with and Provided to Patient:   Discharge Instructions   None      Sabas Sous, MD 09/08/22 [phone]

## 2022-09-08 NOTE — ED Notes (Signed)
Dr. Pilar Plate made aware of critical potassium 2.6

## 2022-09-09 ENCOUNTER — Telehealth (HOSPITAL_COMMUNITY): Payer: Medicare Other | Admitting: Psychiatry

## 2022-09-09 ENCOUNTER — Encounter (HOSPITAL_COMMUNITY): Payer: Self-pay

## 2022-09-09 ENCOUNTER — Encounter (HOSPITAL_COMMUNITY): Payer: Self-pay | Admitting: Internal Medicine

## 2022-09-09 DIAGNOSIS — Z7189 Other specified counseling: Secondary | ICD-10-CM | POA: Diagnosis not present

## 2022-09-09 DIAGNOSIS — Z515 Encounter for palliative care: Secondary | ICD-10-CM | POA: Diagnosis not present

## 2022-09-09 DIAGNOSIS — L03115 Cellulitis of right lower limb: Secondary | ICD-10-CM | POA: Diagnosis not present

## 2022-09-09 LAB — CBC
HCT: 36.8 % (ref 36.0–46.0)
Hemoglobin: 11.4 g/dL — ABNORMAL LOW (ref 12.0–15.0)
MCH: 28.2 pg (ref 26.0–34.0)
MCHC: 31 g/dL (ref 30.0–36.0)
MCV: 91.1 fL (ref 80.0–100.0)
Platelets: 186 10*3/uL (ref 150–400)
RBC: 4.04 MIL/uL (ref 3.87–5.11)
RDW: 16.9 % — ABNORMAL HIGH (ref 11.5–15.5)
WBC: 7 10*3/uL (ref 4.0–10.5)
nRBC: 0 % (ref 0.0–0.2)

## 2022-09-09 LAB — BASIC METABOLIC PANEL
Anion gap: 8 (ref 5–15)
BUN: 14 mg/dL (ref 6–20)
CO2: 29 mmol/L (ref 22–32)
Calcium: 8.3 mg/dL — ABNORMAL LOW (ref 8.9–10.3)
Chloride: 99 mmol/L (ref 98–111)
Creatinine, Ser: 0.79 mg/dL (ref 0.44–1.00)
GFR, Estimated: >60 mL/min (ref >60)
Glucose, Bld: 129 mg/dL — ABNORMAL HIGH (ref 70–99)
Potassium: 3.3 mmol/L — ABNORMAL LOW (ref 3.5–5.1)
Sodium: 136 mmol/L (ref 135–145)

## 2022-09-09 LAB — HEMOGLOBIN A1C
Hgb A1c MFr Bld: 6.8 % — ABNORMAL HIGH (ref 4.8–5.6)
Mean Plasma Glucose: 148 mg/dL

## 2022-09-09 LAB — GLUCOSE, CAPILLARY
Glucose-Capillary: 144 mg/dL — ABNORMAL HIGH (ref 70–99)
Glucose-Capillary: 103 mg/dL — ABNORMAL HIGH (ref 70–99)
Glucose-Capillary: 123 mg/dL — ABNORMAL HIGH (ref 70–99)

## 2022-09-09 LAB — MAGNESIUM: Magnesium: 1.8 mg/dL (ref 1.7–2.4)

## 2022-09-09 LAB — HIV ANTIBODY (ROUTINE TESTING W REFLEX): HIV Screen 4th Generation wRfx: NONREACTIVE

## 2022-09-09 MED ORDER — ALBUTEROL SULFATE (2.5 MG/3ML) 0.083% IN NEBU
2.5000 mg | INHALATION_SOLUTION | Freq: Three times a day (TID) | RESPIRATORY_TRACT | Status: DC
  Filled 2022-09-09: qty 3

## 2022-09-09 MED ORDER — ALBUTEROL SULFATE (2.5 MG/3ML) 0.083% IN NEBU: 2.5000 mg | INHALATION_SOLUTION | RESPIRATORY_TRACT | Status: DC | PRN

## 2022-09-09 MED ORDER — LEVOFLOXACIN 500 MG PO TABS: 500.0000 mg | ORAL_TABLET | Freq: Every day | ORAL | 0 refills | Status: DC

## 2022-09-09 MED ORDER — POTASSIUM CHLORIDE CRYS ER 20 MEQ PO TBCR
40.0000 meq | EXTENDED_RELEASE_TABLET | Freq: Two times a day (BID) | ORAL | Status: DC
  Administered 2022-09-09: 40 meq via ORAL
  Filled 2022-09-09: qty 2

## 2022-09-09 NOTE — Progress Notes (Signed)
Mobility Specialist Progress Note:    09/09/22 1046  Mobility  Activity Transferred to/from [facility]  Level of Assistance Contact guard assist, steadying assist  Assistive Device None  Distance Ambulated (ft) 3 ft  Range of Motion/Exercises Active;All extremities  Activity Response Tolerated well  Mobility Referral Yes  $Mobility charge 1 Mobility  Mobility Specialist Start Time (ACUTE ONLY) 1040  Mobility Specialist Stop Time (ACUTE ONLY) 1046  Mobility Specialist Time Calculation (min) (ACUTE ONLY) 6 min   Pt received ambulating from [facility] to bed. Tolerated well, asx throughout. No AD required, CGA for safety. Tolerated well, asx throughout. All needs met, call bell in reach.   Feliciana Rossetti Mobility Specialist Please contact via Special educational needs teacher or  Rehab office at [phone]

## 2022-09-09 NOTE — Progress Notes (Signed)
This RN entered patient room to do shift assessment and morning medications. This nurse noticed that patient seemed drowsy and attempted to arouse patient. Patient was able to be aroused but would fall back to sleep immediately. I called Charge RN Tonna Boehringer. Notified Dr. Sherryll Burger. See new orders.    Arie Sabina, [place] notified this RN that patient was more drowsy than when she first encountered patient at AM CBG. She stated that patient had a purse in her bed that wasn't there when she done the CBG at 0731. This Clinical research associate and [place], [place] went to patient bedside and asked pt if we could look through her belongings patient gave verbal permission. This RN found Diazepam 5 mg. Notified pt that this is a safety concern and we have to check medication in with pharmacy. Pt agreed.   Later patient asked to see this RN about her medications. She was upset about her medications being removed and stated that she didn't understand what the big deal was about her being sleepy earlier this morning. I explained to patient that her mentation / drowsiness was concerning for me. I reminded her that I asked permission to go through her things before doing so with NT in room. She verbalized that she remembers giving me permission. (Wilber Oliphant, NT and [place], [place] were present for conversation. Patient later stated that "I probably had others in the room so they could lie for me" and I informed her that was not the case and that there was only 1 other person in the room. I explained the safety risk to patient and my worries about her mentation and drowsiness. I informed pt that she would receive her medications back at discharge. She verbalized understanding.

## 2022-09-09 NOTE — Discharge Summary (Signed)
Physician Discharge Summary  Joan [name] OZD:[id number] DOB: [birth year] DOA: 09/08/2022  PCP: Myrlene Broker, MD  Admit date: 09/08/2022  Discharge date: 09/09/2022  Admitted From:Home with hospice  Disposition:  Home with hospice  Recommendations for Outpatient Follow-up:  Follow up with PCP in 1-2 weeks Follow up with hospice agency Continue on Levaquin as prescribed to complete course of treatment for cellulitis Continue other home medications as prior  Home Health: None  Equipment/Devices: Nasal cannula oxygen  Discharge Condition:Stable  CODE STATUS: Full  Diet recommendation: Heart Healthy/carb modified  Brief/Interim Summary:  Joan [name] is a 62 y.o. female with medical history significant for terminal COPD under hospice care, OSA on CPAP, obesity, hypertension, dyslipidemia, type 2 diabetes, and more who presented to the ED with complaints of pain worse in her right foot.  She was admitted for right lower extremity cellulitis with failed outpatient treatment.  ABI is within normal limits.  Palliative consultation pending as she appears to have terminal COPD and is on palliative care at home, but wants to remain full code here.  Patient has remained on IV antibiotics throughout the short duration of her admission with improvement in cellulitis noted, however she continues to have her chronic pain.  She has been seen by palliative care with interest in going back to her home with ongoing home hospice care.  She is in stable condition for discharge and can transition to oral antibiotics at this time to complete course of treatment.  No other acute events or concerns noted.  Discharge Diagnoses:  Principal Problem:   Cellulitis of right foot Active Problems:   Hypokalemia   Anxiety   Essential hypertension   Iron deficiency anemia due to dietary causes   Osteoarthritis   Hyperlipidemia associated with type 2 diabetes mellitus (HCC)   Gastroesophageal  reflux disease   OSA on CPAP   Hypertension   PTSD (post-traumatic stress disorder)   Depression   Hypothyroidism   Neuropathy due to type 2 diabetes mellitus (HCC)   Chronic obstructive pulmonary disease (HCC)   PAD (peripheral artery disease) (HCC)  Principal discharge diagnosis: Right foot cellulitis with failed outpatient treatment.  Discharge Instructions  Discharge Instructions     Diet - low sodium heart healthy   Complete by: As directed    Increase activity slowly   Complete by: As directed       Allergies as of 09/09/2022       Reactions   Keflex [cephalexin] Rash, Other (See Comments)   Has tolerated amoxicillin since had keflex   Shellfish Allergy Shortness Of Breath, Nausea And Vomiting, Other (See Comments)   Stomach cramps   Tetracyclines & Related Anaphylaxis, Swelling, Other (See Comments)   Throat swelling requiring hospitalization   Dilaudid [hydromorphone Hcl] Other (See Comments)   "Lethargy"   Prednisone Other (See Comments)   "counter reacts", has tolerated IV medrol   Clindamycin/lincomycin Other (See Comments)   Burning sensation in her mouth down her throat    Incruse Ellipta [umeclidinium Bromide] Nausea Only   Molds & Smuts Itching   Tuberculin Tests Other (See Comments)   False postive   Umeclidinium    Other Reaction(s): GI Intolerance        Medication List     TAKE these medications    Accu-Chek Softclix Lancets lancets Use as instructed   acyclovir 400 MG tablet Commonly known as: ZOVIRAX TAKE 1 TABLET BY MOUTH TWICE  DAILY   albuterol (2.5 MG/3ML) 0.083% nebulizer solution  Commonly known as: PROVENTIL Take 3 mLs (2.5 mg total) by nebulization 2 (two) times daily. And every 6 hours as needed.  DX: J44.9   atenolol 25 MG tablet Commonly known as: TENORMIN TAKE 1 TABLET BY MOUTH  DAILY   atorvastatin 20 MG tablet Commonly known as: LIPITOR TAKE 1 TABLET BY MOUTH ONCE  DAILY   Blood Glucose Monitoring Suppl Devi 1  each by Does not apply route in the morning, at noon, and at bedtime. May substitute to any manufacturer covered by patient's insurance.   BLOOD GLUCOSE TEST STRIPS Strp 1 each by In Vitro route in the morning, at noon, and at bedtime. May substitute to any manufacturer covered by patient's insurance.   Breztri Aerosphere 160-9-4.8 MCG/ACT Aero Generic drug: Budeson-Glycopyrrol-Formoterol Inhale 2 puffs into the lungs in the morning and at bedtime.   buPROPion 300 MG 24 hr tablet Commonly known as: WELLBUTRIN XL TAKE 1 TABLET BY MOUTH DAILY   busPIRone 5 MG tablet Commonly known as: BUSPAR Take 1 tablet (5 mg total) by mouth at bedtime.   Combivent Respimat 20-100 MCG/ACT Aers respimat Generic drug: Ipratropium-Albuterol Inhale 1 puff into the lungs every 6 (six) hours as needed for wheezing or shortness of breath.   cyclobenzaprine 10 MG tablet Commonly known as: FLEXERIL TAKE 1 TABLET BY MOUTH AT  BEDTIME   diazepam 5 MG tablet Commonly known as: Valium Take 1 tablet (5 mg total) by mouth every 8 (eight) hours as needed for anxiety.   diphenhydrAMINE 25 mg capsule Commonly known as: BENADRYL Take 25 mg by mouth 2 (two) times daily.   fluticasone 50 MCG/ACT nasal spray Commonly known as: FLONASE Place 2 sprays into both nostrils daily. What changed:  when to take this reasons to take this   guaiFENesin-Codeine 225-7.5 MG/5ML Liqd Take 5 mLs by mouth 3 (three) times daily as needed.   hydrochlorothiazide 25 MG tablet Commonly known as: HYDRODIURIL TAKE 1 TABLET BY MOUTH DAILY   Lancet Device Misc 1 each by Does not apply route in the morning, at noon, and at bedtime. May substitute to any manufacturer covered by patient's insurance.   Lancets Misc. Misc 1 each by Does not apply route in the morning, at noon, and at bedtime. May substitute to any manufacturer covered by patient's insurance.   levofloxacin 500 MG tablet Commonly known as: Levaquin Take 1 tablet  (500 mg total) by mouth daily for 5 days.   levothyroxine 25 MCG tablet Commonly known as: SYNTHROID TAKE 1 TABLET BY MOUTH DAILY  BEFORE BREAKFAST   MELATONIN PO Take 1 tablet by mouth at bedtime as needed (sleep).   montelukast 10 MG tablet Commonly known as: SINGULAIR TAKE 1 TABLET BY MOUTH AT  BEDTIME   naproxen 500 MG tablet Commonly known as: NAPROSYN Take 1 tablet (500 mg total) by mouth 2 (two) times daily with a meal.   nystatin-triamcinolone ointment Commonly known as: MYCOLOG Apply 1 application topically 2 (two) times daily as needed (applied to skin folds/groins/under breast skin irritation rash.).   OXYGEN Inhale 3 L/min into the lungs See admin instructions. Inhale  3 L/min of oxygen into the lungs w/CPAP at bedtime and as needed for shortness of breath with "strenuous activity" during the day   pantoprazole 40 MG tablet Commonly known as: PROTONIX TAKE 1 TABLET BY MOUTH DAILY   Pataday 0.2 % Soln Generic drug: Olopatadine HCl Place 1 drop into both eyes daily.   polyethylene glycol 17 g packet Commonly known as:  MIRALAX / GLYCOLAX Take 17 g by mouth daily as needed for mild constipation.   pregabalin 300 MG capsule Commonly known as: Lyrica Take 1 capsule (300 mg total) by mouth 2 (two) times daily.   QUEtiapine 100 MG tablet Commonly known as: SEROQUEL TAKE 1 TABLET BY MOUTH AT  BEDTIME   roflumilast 500 MCG Tabs tablet Commonly known as: DALIRESP Take 1 tablet (500 mcg total) by mouth daily.   traZODone 100 MG tablet Commonly known as: DESYREL TAKE 1 TABLET BY MOUTH AT  BEDTIME   triamcinolone cream 0.1 % Commonly known as: KENALOG Apply 1 Application topically 2 (two) times daily.   Varenicline Tartrate (Starter) 0.5 MG X 11 & 1 MG X 42 Tbpk Commonly known as: Chantix Starting Month Pak Chantix started pack Take 0.5 mg once daily x 3 days, then take 0.5 mg twice daily x 4 days, then take 1 mg twice daily thereafter.   venlafaxine XR 150  MG 24 hr capsule Commonly known as: EFFEXOR-XR TAKE 1 CAPSULE BY MOUTH DAILY  WITH BREAKFAST   witch hazel-glycerin pad Commonly known as: TUCKS Apply topically as needed for itching.        Follow-up Information     Myrlene Broker, MD. Schedule an appointment as soon as possible for a visit in 1 week(s).   Specialty: Internal Medicine Contact information: [address] [place] [address] [phone]                Allergies  Allergen Reactions   Keflex [Cephalexin] Rash and Other (See Comments)    Has tolerated amoxicillin since had keflex   Shellfish Allergy Shortness Of Breath, Nausea And Vomiting and Other (See Comments)    Stomach cramps   Tetracyclines & Related Anaphylaxis, Swelling and Other (See Comments)    Throat swelling requiring hospitalization   Dilaudid [Hydromorphone Hcl] Other (See Comments)    "Lethargy"   Prednisone Other (See Comments)    "counter reacts", has tolerated IV medrol   Clindamycin/Lincomycin Other (See Comments)    Burning sensation in her mouth down her throat    Incruse Ellipta [Umeclidinium Bromide] Nausea Only   Molds & Smuts Itching   Tuberculin Tests Other (See Comments)    False postive   Umeclidinium     Other Reaction(s): GI Intolerance    Consultations: Palliative care   Procedures/Studies: US ARTERIAL ABI (SCREENING LOWER EXTREMITY)  Result Date: 09/08/2022 CLINICAL DATA:  Cellulitis. Evaluate for peripheral arterial disease. EXAM: NONINVASIVE PHYSIOLOGIC VASCULAR STUDY OF BILATERAL LOWER EXTREMITIES TECHNIQUE: Evaluation of both lower extremities were performed at rest, including calculation of ankle-brachial indices with single level Doppler, pressure and pulse volume recording. COMPARISON:  CT abdomen and pelvis-04/23/2021 FINDINGS: Right ABI:  1.1 Left ABI:  1.1 Right Lower Extremity: Normal waveforms are demonstrated within the right dorsalis pedis artery. Biphasic waveforms are demonstrated  within the right posterior tibial artery. Left Lower Extremity: Biphasic waveforms are demonstrated within the left posterior tibial artery. Monophasic waveforms are demonstrated within the left dorsalis pedis artery. IMPRESSION: Normal ABIs bilaterally. Electronically Signed   By: Simonne Come M.D.   On: 09/08/2022 11:46   DG Foot Complete Right  Result Date: 09/08/2022 CLINICAL DATA:  Right foot pain and cellulitis, initial encounter EXAM: RIGHT FOOT COMPLETE - 3+ VIEW COMPARISON:  None Available. FINDINGS: Mild soft tissue swelling is noted over the metatarsals. Radiopaque density is again seen posterior to the tibiotalar joint. Irregularity in the medial talar dome is noted of uncertain  chronicity. IMPRESSION: Stable radiopaque density posterior to the tibiotalar joint. Irregularity of the medial aspect of the talar dome poorly visualized on prior tibia and fibula films. This may be related to chronic osteochondral defect. Electronically Signed   By: Alcide Clever M.D.   On: 09/08/2022 03:16   DG Foot Complete Left  Result Date: 09/08/2022 CLINICAL DATA:  Recent trauma with foot swelling, initial encounter EXAM: LEFT FOOT - COMPLETE 3+ VIEW COMPARISON:  None Available. FINDINGS: Mild deformity of the distal aspect of the fifth proximal phalanx likely related to prior trauma healing. No soft tissue abnormality is seen. No acute fracture is noted. IMPRESSION: No acute abnormality noted. Electronically Signed   By: Alcide Clever M.D.   On: 09/08/2022 03:14   DG Tibia/Fibula Left  Result Date: 09/08/2022 CLINICAL DATA:  Recent trauma with cellulitis, initial encounter EXAM: LEFT TIBIA AND FIBULA - 2 VIEW COMPARISON:  None Available. FINDINGS: No acute fracture or dislocation is noted. No soft tissue abnormality is seen. IMPRESSION: No acute abnormality noted. Electronically Signed   By: Alcide Clever M.D.   On: 09/08/2022 03:13   DG Tibia/Fibula Right  Result Date: 09/08/2022 CLINICAL DATA:  Recent  trauma with cellulitis, initial encounter EXAM: RIGHT TIBIA AND FIBULA - 2 VIEW COMPARISON:  None Available. FINDINGS: No acute fracture or dislocation is noted. No soft tissue abnormality is seen. Linear density is noted posterior to the talar tibial joint which may represent a radiopaque foreign body. IMPRESSION: Distal right leg foreign body posterior to the tibiotalar joint. Electronically Signed   By: Alcide Clever M.D.   On: 09/08/2022 03:13     Discharge Exam: Vitals:   09/09/22 0830 09/09/22 1242  BP: 105/63 131/67  Pulse: 60 68  Resp: 16 20  Temp: (!) 97.5 F (36.4 C) 98.6 F (37 C)  SpO2: 99% 100%   Vitals:   09/09/22 0630 09/09/22 0748 09/09/22 0830 09/09/22 1242  BP: (!) 104/92  105/63 131/67  Pulse: 72 62 60 68  Resp: (!) 21 18 16 20   Temp: (!) 97.5 F (36.4 C)  (!) 97.5 F (36.4 C) 98.6 F (37 C)  TempSrc: Axillary  Oral   SpO2: 97% 95% 99% 100%  Weight:      Height:        General: Pt is alert, awake, not in acute distress Cardiovascular: RRR, S1/S2 +, no rubs, no gallops Respiratory: CTA bilaterally, no wheezing, no rhonchi, nasal cannula Abdominal: Soft, NT, ND, bowel sounds + Extremities: no edema, no cyanosis    The results of significant diagnostics from this hospitalization (including imaging, microbiology, ancillary and laboratory) are listed below for reference.     Microbiology: Recent Results (from the past 240 hour(s))  Culture, blood (Routine X 2) w Reflex to ID Panel     Status: None (Preliminary result)   Collection Time: 09/08/22  8:38 AM   Specimen: BLOOD LEFT HAND  Result Value Ref Range Status   Specimen Description BLOOD LEFT HAND AEROBIC BOTTLE ONLY  Final   Special Requests   Final    Blood Culture results may not be optimal due to an inadequate volume of blood received in culture bottles   Culture   Final    NO GROWTH 1 DAY Performed at [facility], [address], [place], [place] [address]    Report Status PENDING  Incomplete   Culture, blood (Routine X 2) w Reflex to ID Panel     Status: None (Preliminary result)  Collection Time: 09/08/22  8:42 AM   Specimen: BLOOD RIGHT FOREARM  Result Value Ref Range Status   Specimen Description BLOOD RIGHT FOREARM AEROBIC BOTTLE ONLY  Final   Special Requests Blood Culture adequate volume  Final   Culture   Final    NO GROWTH 1 DAY Performed at [facility], [address] [place], [place] [address]    Report Status PENDING  Incomplete     Labs: BNP (last 3 results) No results for input(s): "BNP" in the last 8760 hours. Basic Metabolic Panel: Recent Labs  Lab 09/08/22 0325 09/08/22 0842 09/09/22 0534  NA 135  --  136  K 2.6*  --  3.3*  CL 94*  --  99  CO2 30  --  29  GLUCOSE 124*  --  129*  BUN 11  --  14  CREATININE 0.94  --  0.79  CALCIUM 8.6*  --  8.3*  MG  --  1.8 1.8   Liver Function Tests: Recent Labs  Lab 09/08/22 0325  AST 23  ALT 19  ALKPHOS 90  BILITOT 0.5  PROT 6.1*  ALBUMIN 3.4*   No results for input(s): "LIPASE", "AMYLASE" in the last 168 hours. No results for input(s): "AMMONIA" in the last 168 hours. CBC: Recent Labs  Lab 09/08/22 0325 09/09/22 0534  WBC 8.8 7.0  HGB 11.9* 11.4*  HCT 36.0 36.8  MCV 87.6 91.1  PLT 218 186   Cardiac Enzymes: No results for input(s): "CKTOTAL", "CKMB", "CKMBINDEX", "TROPONINI" in the last 168 hours. BNP: Invalid input(s): "POCBNP" CBG: Recent Labs  Lab 09/08/22 1432 09/08/22 1530 09/08/22 2129 09/09/22 0731 09/09/22 1118  GLUCAP 107* 138* 123* 144* 103*   D-Dimer No results for input(s): "DDIMER" in the last 72 hours. Hgb A1c Recent Labs    09/08/22 0842  HGBA1C 6.8*   Lipid Profile No results for input(s): "CHOL", "HDL", "LDLCALC", "TRIG", "CHOLHDL", "LDLDIRECT" in the last 72 hours. Thyroid function studies No results for input(s): "TSH", "T4TOTAL", "T3FREE", "THYROIDAB" in the last 72 hours.  Invalid input(s): "FREET3" Anemia work up No results for input(s):  "VITAMINB12", "FOLATE", "FERRITIN", "TIBC", "IRON", "RETICCTPCT" in the last 72 hours. Urinalysis    Component Value Date/Time   COLORURINE YELLOW 03/30/2019 1838   APPEARANCEUR CLEAR 03/30/2019 1838   LABSPEC 1.045 (H) 03/30/2019 1838   PHURINE 7.0 03/30/2019 1838   GLUCOSEU NEGATIVE 03/30/2019 1838   GLUCOSEU NEGATIVE 08/05/2017 1652   HGBUR SMALL (A) 03/30/2019 1838   BILIRUBINUR NEGATIVE 03/30/2019 1838   KETONESUR NEGATIVE 03/30/2019 1838   PROTEINUR NEGATIVE 03/30/2019 1838   UROBILINOGEN 0.2 08/05/2017 1652   NITRITE NEGATIVE 03/30/2019 1838   LEUKOCYTESUR NEGATIVE 03/30/2019 1838   Sepsis Labs Recent Labs  Lab 09/08/22 0325 09/09/22 0534  WBC 8.8 7.0   Microbiology Recent Results (from the past 240 hour(s))  Culture, blood (Routine X 2) w Reflex to ID Panel     Status: None (Preliminary result)   Collection Time: 09/08/22  8:38 AM   Specimen: BLOOD LEFT HAND  Result Value Ref Range Status   Specimen Description BLOOD LEFT HAND AEROBIC BOTTLE ONLY  Final   Special Requests   Final    Blood Culture results may not be optimal due to an inadequate volume of blood received in culture bottles   Culture   Final    NO GROWTH 1 DAY Performed at [facility], [address] [place], [place] [address]    Report Status PENDING  Incomplete  Culture,  blood (Routine X 2) w Reflex to ID Panel     Status: None (Preliminary result)   Collection Time: 09/08/22  8:42 AM   Specimen: BLOOD RIGHT FOREARM  Result Value Ref Range Status   Specimen Description BLOOD RIGHT FOREARM AEROBIC BOTTLE ONLY  Final   Special Requests Blood Culture adequate volume  Final   Culture   Final    NO GROWTH 1 DAY Performed at [facility], [address], [place], [place] [address]    Report Status PENDING  Incomplete     Time coordinating discharge: 35 minutes  SIGNED:   Erick Blinks, DO [facility] 09/09/2022, 1:18 PM  If 7PM-7AM, please contact night-coverage [email or web address]

## 2022-09-09 NOTE — Plan of Care (Signed)
   Problem: Education: Goal: Knowledge of General Education information will improve Description: Including pain rating scale, medication(s)/side effects and non-pharmacologic comfort measures Outcome: Completed/Met   Problem: Health Behavior/Discharge Planning: Goal: Ability to manage health-related needs will improve Outcome: Completed/Met   Problem: Clinical Measurements: Goal: Ability to maintain clinical measurements within normal limits will improve Outcome: Completed/Met Goal: Will remain free from infection Outcome: Completed/Met Goal: Diagnostic test results will improve Outcome: Completed/Met Goal: Respiratory complications will improve Outcome: Completed/Met Goal: Cardiovascular complication will be avoided Outcome: Completed/Met   Problem: Activity: Goal: Risk for activity intolerance will decrease Outcome: Completed/Met   Problem: Nutrition: Goal: Adequate nutrition will be maintained Outcome: Completed/Met   Problem: Coping: Goal: Level of anxiety will decrease Outcome: Completed/Met   Problem: Elimination: Goal: Will not experience complications related to bowel motility Outcome: Completed/Met Goal: Will not experience complications related to urinary retention Outcome: Completed/Met   Problem: Pain Managment: Goal: General experience of comfort will improve Outcome: Completed/Met   Problem: Safety: Goal: Ability to remain free from injury will improve Outcome: Completed/Met   Problem: Skin Integrity: Goal: Risk for impaired skin integrity will decrease Outcome: Completed/Met   Problem: Education: Goal: Ability to describe self-care measures that may prevent or decrease complications (Diabetes Survival Skills Education) will improve Outcome: Completed/Met Goal: Individualized Educational Video(s) Outcome: Completed/Met   Problem: Coping: Goal: Ability to adjust to condition or change in health will improve Outcome: Completed/Met   Problem:  Fluid Volume: Goal: Ability to maintain a balanced intake and output will improve Outcome: Completed/Met   Problem: Health Behavior/Discharge Planning: Goal: Ability to identify and utilize available resources and services will improve Outcome: Completed/Met Goal: Ability to manage health-related needs will improve Outcome: Completed/Met   Problem: Metabolic: Goal: Ability to maintain appropriate glucose levels will improve Outcome: Completed/Met   Problem: Nutritional: Goal: Maintenance of adequate nutrition will improve Outcome: Completed/Met Goal: Progress toward achieving an optimal weight will improve Outcome: Completed/Met   Problem: Skin Integrity: Goal: Risk for impaired skin integrity will decrease Outcome: Completed/Met   Problem: Tissue Perfusion: Goal: Adequacy of tissue perfusion will improve Outcome: Completed/Met

## 2022-09-09 NOTE — Care Management Obs Status (Signed)
MEDICARE OBSERVATION STATUS NOTIFICATION   Patient Details  Name: Joan [name] MRN: [id number] Date of Birth: Oct 22, 1960   Medicare Observation Status Notification Given:  Yes    Leitha Bleak, RN 09/09/2022, 2:19 PM

## 2022-09-09 NOTE — Progress Notes (Addendum)
PROGRESS NOTE    Joan [name]  NUU:[id number] DOB: August 22, 1960 DOA: 09/08/2022 PCP: Myrlene Broker, MD   Brief Narrative:    Joan [name] is a 62 y.o. female with medical history significant for terminal COPD under hospice care, OSA on CPAP, obesity, hypertension, dyslipidemia, type 2 diabetes, and more who presented to the ED with complaints of pain worse in her right foot.  She was admitted for right lower extremity cellulitis with failed outpatient treatment.  ABI is within normal limits.  Palliative consultation pending as she appears to have terminal COPD and is on palliative care at home, but wants to remain full code here.  Assessment & Plan:   Principal Problem:   Cellulitis of right foot Active Problems:   Hypokalemia   Anxiety   Essential hypertension   Iron deficiency anemia due to dietary causes   Osteoarthritis   Hyperlipidemia associated with type 2 diabetes mellitus (HCC)   Gastroesophageal reflux disease   OSA on CPAP   Hypertension   PTSD (post-traumatic stress disorder)   Depression   Hypothyroidism   Neuropathy due to type 2 diabetes mellitus (HCC)   Chronic obstructive pulmonary disease (HCC)   PAD (peripheral artery disease) (HCC)  Assessment and Plan:   Right lower extremity cellulitis with failed outpatient treatment Continue vancomycin and Rocephin as given in the ED Check MRSA PCR Monitor blood cultures, unfortunately not obtained in ED ABI within normal limits   Hypokalemia Replete and reevaluate in a.m.   Terminal COPD with chronic hypoxemia Currently under hospice care, but wants to remain full code, consult to palliative care Wears 3 L nasal cannula at baseline   OSA on CPAP CPAP at bedtime   Type 2 diabetes with neuropathy Carb modified diet and SSI   Hypertension Continue home medications   Dyslipidemia Continue statin   Anxiety/depression Hold home medications until somnolence improves    Hypothyroidism Continue Synthroid   GERD PPI   Polypharmacy Hold sedating medication's due to somnolence   Obesity BMI 30.50   DVT prophylaxis:Lovenox Code Status: Full Family Communication: None at bedside Disposition Plan:  Status is: Inpatient Remains inpatient appropriate because: Need for IV medications.  Consultants:  Palliative care  Procedures:  None  Antimicrobials:  Anti-infectives (From admission, onward)    Start     Dose/Rate Route Frequency Ordered Stop   09/09/22 0400  cefTRIAXone (ROCEPHIN) 1 g in sodium chloride 0.9 % 100 mL IVPB        1 g 200 mL/hr over 30 Minutes Intravenous Every 24 hours 09/08/22 0816     09/08/22 1800  vancomycin (VANCOCIN) IVPB 1000 mg/200 mL premix        1,000 mg 200 mL/hr over 60 Minutes Intravenous Every 12 hours 09/08/22 0847     09/08/22 0230  cefTRIAXone (ROCEPHIN) 2 g in sodium chloride 0.9 % 100 mL IVPB        2 g 200 mL/hr over 30 Minutes Intravenous  Once 09/08/22 0215 09/08/22 0347   09/08/22 0230  vancomycin (VANCOREADY) IVPB 1500 mg/300 mL        1,500 mg 150 mL/hr over 120 Minutes Intravenous  Once 09/08/22 0227 09/08/22 0552       Subjective: Patient seen and evaluated today and appears more somnolent this morning.  She continues to have pain related complaints.  No acute overnight events noted.  Objective: Vitals:   09/08/22 2303 09/09/22 0630 09/09/22 0748 09/09/22 0830  BP: 118/67 (!) 104/92  105/63  Pulse:  99 72 62 60  Resp: 20 (!) 21 18 16   Temp: 97.8 F (36.6 C) (!) 97.5 F (36.4 C)  (!) 97.5 F (36.4 C)  TempSrc: Oral Axillary  Oral  SpO2: 99% 97% 95% 99%  Weight:      Height:        Intake/Output Summary (Last 24 hours) at 09/09/2022 1004 Last data filed at 09/08/2022 2230 Gross per 24 hour  Intake 780 ml  Output 650 ml  Net 130 ml   Filed Weights   09/07/22 2348  Weight: 91 kg    Examination:  General exam: Appears calm and comfortable, appears more somnolent Respiratory  system: Clear to auscultation. Respiratory effort normal. Cardiovascular system: S1 & S2 heard, RRR.  Gastrointestinal system: Abdomen is soft Central nervous system: Somnolent, but responsive Extremities: No edema Skin: No significant lesions noted Psychiatry: Flat affect.    Data Reviewed: I have personally reviewed following labs and imaging studies  CBC: Recent Labs  Lab 09/08/22 0325 09/09/22 0534  WBC 8.8 7.0  HGB 11.9* 11.4*  HCT 36.0 36.8  MCV 87.6 91.1  PLT 218 186   Basic Metabolic Panel: Recent Labs  Lab 09/08/22 0325 09/08/22 0842 09/09/22 0534  NA 135  --  136  K 2.6*  --  3.3*  CL 94*  --  99  CO2 30  --  29  GLUCOSE 124*  --  129*  BUN 11  --  14  CREATININE 0.94  --  0.79  CALCIUM 8.6*  --  8.3*  MG  --  1.8 1.8   GFR: Estimated Creatinine Clearance: 88.2 mL/min (by C-G formula based on SCr of 0.79 mg/dL). Liver Function Tests: Recent Labs  Lab 09/08/22 0325  AST 23  ALT 19  ALKPHOS 90  BILITOT 0.5  PROT 6.1*  ALBUMIN 3.4*   No results for input(s): "LIPASE", "AMYLASE" in the last 168 hours. No results for input(s): "AMMONIA" in the last 168 hours. Coagulation Profile: No results for input(s): "INR", "PROTIME" in the last 168 hours. Cardiac Enzymes: No results for input(s): "CKTOTAL", "CKMB", "CKMBINDEX", "TROPONINI" in the last 168 hours. BNP (last 3 results) No results for input(s): "PROBNP" in the last 8760 hours. HbA1C: Recent Labs    09/08/22 0842  HGBA1C 6.8*   CBG: Recent Labs  Lab 09/08/22 1432 09/08/22 1530 09/08/22 2129 09/09/22 0731  GLUCAP 107* 138* 123* 144*   Lipid Profile: No results for input(s): "CHOL", "HDL", "LDLCALC", "TRIG", "CHOLHDL", "LDLDIRECT" in the last 72 hours. Thyroid Function Tests: No results for input(s): "TSH", "T4TOTAL", "FREET4", "T3FREE", "THYROIDAB" in the last 72 hours. Anemia Panel: No results for input(s): "VITAMINB12", "FOLATE", "FERRITIN", "TIBC", "IRON", "RETICCTPCT" in the last  72 hours. Sepsis Labs: No results for input(s): "PROCALCITON", "LATICACIDVEN" in the last 168 hours.  Recent Results (from the past 240 hour(s))  Culture, blood (Routine X 2) w Reflex to ID Panel     Status: None (Preliminary result)   Collection Time: 09/08/22  8:38 AM   Specimen: BLOOD LEFT HAND  Result Value Ref Range Status   Specimen Description BLOOD LEFT HAND AEROBIC BOTTLE ONLY  Final   Special Requests   Final    Blood Culture results may not be optimal due to an inadequate volume of blood received in culture bottles   Culture   Final    NO GROWTH 1 DAY Performed at [facility], [address] [place], [place] [address]    Report Status PENDING  Incomplete  Culture, blood (Routine X 2) w Reflex to ID Panel     Status: None (Preliminary result)   Collection Time: 09/08/22  8:42 AM   Specimen: BLOOD RIGHT FOREARM  Result Value Ref Range Status   Specimen Description BLOOD RIGHT FOREARM AEROBIC BOTTLE ONLY  Final   Special Requests Blood Culture adequate volume  Final   Culture   Final    NO GROWTH 1 DAY Performed at [facility], [address], [place], [place] [address]    Report Status PENDING  Incomplete         Radiology Studies: US ARTERIAL ABI (SCREENING LOWER EXTREMITY)  Result Date: 09/08/2022 CLINICAL DATA:  Cellulitis. Evaluate for peripheral arterial disease. EXAM: NONINVASIVE PHYSIOLOGIC VASCULAR STUDY OF BILATERAL LOWER EXTREMITIES TECHNIQUE: Evaluation of both lower extremities were performed at rest, including calculation of ankle-brachial indices with single level Doppler, pressure and pulse volume recording. COMPARISON:  CT abdomen and pelvis-04/23/2021 FINDINGS: Right ABI:  1.1 Left ABI:  1.1 Right Lower Extremity: Normal waveforms are demonstrated within the right dorsalis pedis artery. Biphasic waveforms are demonstrated within the right posterior tibial artery. Left Lower Extremity: Biphasic waveforms are demonstrated within the left posterior tibial  artery. Monophasic waveforms are demonstrated within the left dorsalis pedis artery. IMPRESSION: Normal ABIs bilaterally. Electronically Signed   By: Simonne Come M.D.   On: 09/08/2022 11:46   DG Foot Complete Right  Result Date: 09/08/2022 CLINICAL DATA:  Right foot pain and cellulitis, initial encounter EXAM: RIGHT FOOT COMPLETE - 3+ VIEW COMPARISON:  None Available. FINDINGS: Mild soft tissue swelling is noted over the metatarsals. Radiopaque density is again seen posterior to the tibiotalar joint. Irregularity in the medial talar dome is noted of uncertain chronicity. IMPRESSION: Stable radiopaque density posterior to the tibiotalar joint. Irregularity of the medial aspect of the talar dome poorly visualized on prior tibia and fibula films. This may be related to chronic osteochondral defect. Electronically Signed   By: Alcide Clever M.D.   On: 09/08/2022 03:16   DG Foot Complete Left  Result Date: 09/08/2022 CLINICAL DATA:  Recent trauma with foot swelling, initial encounter EXAM: LEFT FOOT - COMPLETE 3+ VIEW COMPARISON:  None Available. FINDINGS: Mild deformity of the distal aspect of the fifth proximal phalanx likely related to prior trauma healing. No soft tissue abnormality is seen. No acute fracture is noted. IMPRESSION: No acute abnormality noted. Electronically Signed   By: Alcide Clever M.D.   On: 09/08/2022 03:14   DG Tibia/Fibula Left  Result Date: 09/08/2022 CLINICAL DATA:  Recent trauma with cellulitis, initial encounter EXAM: LEFT TIBIA AND FIBULA - 2 VIEW COMPARISON:  None Available. FINDINGS: No acute fracture or dislocation is noted. No soft tissue abnormality is seen. IMPRESSION: No acute abnormality noted. Electronically Signed   By: Alcide Clever M.D.   On: 09/08/2022 03:13   DG Tibia/Fibula Right  Result Date: 09/08/2022 CLINICAL DATA:  Recent trauma with cellulitis, initial encounter EXAM: RIGHT TIBIA AND FIBULA - 2 VIEW COMPARISON:  None Available. FINDINGS: No acute fracture  or dislocation is noted. No soft tissue abnormality is seen. Linear density is noted posterior to the talar tibial joint which may represent a radiopaque foreign body. IMPRESSION: Distal right leg foreign body posterior to the tibiotalar joint. Electronically Signed   By: Alcide Clever M.D.   On: 09/08/2022 03:13        Scheduled Meds:  albuterol  2.5 mg Nebulization TID   atenolol  25 mg Oral Daily   atorvastatin  20 mg Oral Daily   enoxaparin (LOVENOX) injection  40 mg Subcutaneous Q24H   insulin aspart  0-5 Units Subcutaneous QHS   insulin aspart  0-9 Units Subcutaneous TID WC   levothyroxine  25 mcg Oral QAC breakfast   montelukast  10 mg Oral QHS   olopatadine  1 drop Both Eyes BID   pantoprazole  40 mg Oral Daily   potassium chloride  40 mEq Oral BID   QUEtiapine  100 mg Oral QHS   roflumilast  500 mcg Oral Daily   traZODone  100 mg Oral QHS   Continuous Infusions:  cefTRIAXone (ROCEPHIN)  IV 1 g (09/09/22 0343)   vancomycin 1,000 mg (09/09/22 0558)     LOS: 1 day    Time spent: 35 minutes    Yancarlos Berthold Hoover Brunette, DO [facility]  If 7PM-7AM, please contact night-coverage [email or web address] 09/09/2022, 10:04 AM

## 2022-09-09 NOTE — Consult Note (Signed)
Consultation Note Date: 09/09/2022   Patient Name: Joan [name]  DOB: 1960-12-06  MRN: [id number]  Age / Sex: 63 y.o., female  PCP: Myrlene Broker, MD Referring Physician: Erick Blinks, DO  Reason for Consultation: Establishing goals of care  HPI/Patient Profile: 62 y.o. female  with past medical history of end-stage/terminal COPD under [facility] hosp[facility] care, OSA on CPAP, obesity, HTN/HLD, DM2, PTSD, anxiety/depression, hypothyroid, PAD/neuropathy admitted on 09/08/2022 with cellulitis of right foot after injuring.   Clinical Assessment and Goals of Care: I have reviewed medical records including EPIC notes, labs and imaging, received report from RN, assessed the patient.  Ms. Odriscoll is sitting up in bed.  She appears chronically ill and somewhat frail.  She greets me, making but not keeping eye contact.  She is alert and oriented x 3, able to make her needs known.  There is no family at bedside at this time.    We meet at the bedside to discuss diagnosis prognosis, GOC, EOL wishes, disposition and options.  I introduced Palliative Medicine as specialized medical care for people living with serious illness. It focuses on providing relief from the symptoms and stress of a serious illness. The goal is to improve quality of life for both the patient and the family.  We discussed a brief life review of the patient and then focused on their current illness.  We talk about her acute health concerns and the treatment plan.  Ms. Taff tells me that she would prefer to return to her own home and be treated in her home.  She has been speaking with her trusted hospice RN, Rayfield Citizen.  They are coming to evaluate her today to help with transitioning to home care.  The natural disease trajectory and expectations at EOL were discussed.  Advanced directives, concepts specific to code status, artifical feeding and  hydration, and rehospitalization were considered and discussed.  At this point Ms. Eustice continues to be full scope/full code  Hospice and Palliative Care services outpatient were explained and offered.  Ms. Boody is active with [facility] for "treat the treatable" hospice care.  She is to be evaluated by her regular RN, Rayfield Citizen, today.  Discussed the importance of continued conversation with family and the medical providers regarding overall plan of care and treatment options, ensuring decisions are within the context of the patient's values and GOCs.  Questions and concerns were addressed.  The patient was encouraged to call with questions or concerns.  PMT will continue to support holistically.  Conference with attending, bedside nursing staff, transition of care team related to patient condition, needs, goals of care, disposition.   HCPOA HCPOA -daughter, [name]    SUMMARY OF RECOMMENDATIONS   At this point continue to treat the treatable. Remains full code under hospice care Active with [facility] hosp[facility] Anticipate home today.   Code Status/Advance Care Planning: Full code  Symptom Management:  Per hospitalist, no additional needs at this time.  Palliative Prophylaxis:  Frequent Pain Assessment, Oral Care, and Palliative  Wound Care  Additional Recommendations (Limitations, Scope, Preferences): Full Scope Treatment  Psycho-social/Spiritual:  Desire for further Chaplaincy support:no Additional Recommendations: Caregiving  Support/Resources and Education on Hospice  Prognosis:  Unable to determine, based on outcomes.  Prognostication for CAPD can be very difficult.  Clearly, 6 months or less would be anticipated based on decreasing functional status, chronic illness burden.  Discharge Planning: Home with "treat the treatable" hospice care services already in place with [facility]      Primary Diagnoses: Present on Admission:  Cellulitis of right foot  Anxiety   Chronic obstructive pulmonary disease (HCC)  Depression  Essential hypertension  Gastroesophageal reflux disease  Hyperlipidemia associated with type 2 diabetes mellitus (HCC)  Hypertension  Hypokalemia  Hypothyroidism  Iron deficiency anemia due to dietary causes  Osteoarthritis  PAD (peripheral artery disease) (HCC)  PTSD (post-traumatic stress disorder)  Neuropathy due to type 2 diabetes mellitus (HCC)   I have reviewed the medical record, interviewed the patient and family, and examined the patient. The following aspects are pertinent.  Past Medical History:  Diagnosis Date   Acute respiratory failure with hypoxia (HCC)    Alcoholism and drug addiction in family    Anxiety    Appendicitis    Arthritis    "knees, hips, lower back" (09/25/2016)   Asthma    Bipolar disorder (HCC)    Chronic bronchitis (HCC)    "all the time" (09/25/2016)   Chronic leg pain    RLE   Chronic lower back pain    COPD (chronic obstructive pulmonary disease) (HCC)    Depression    Diabetes mellitus without complication (HCC)    Diabetic neuropathy (HCC)    Early cataracts, bilateral    Emphysema of lung (HCC)    Generalized anxiety disorder 02/09/2020   GERD (gastroesophageal reflux disease)    Headache    Hepatitis C    "treated back in 2001-2002"   High cholesterol    Hypertension    Hypothyroidism    Incarcerated ventral hernia 04/04/2017   Mood disorder in conditions classified elsewhere 02/09/2020   On home oxygen therapy    "2L; at nighttime" (04/04/2017)   OSA on CPAP    CPAP with Oxygen 3 liters   Paranoid (HCC)    Pneumonia    "all the time" (09/25/2016)   Positive PPD    with negative chest x ray   PTSD (post-traumatic stress disorder) 02/09/2020   Tachycardia    patient reports with exertion ; denies any other conditions    Thyroid disease    Wears glasses    Wears partial dentures    Social History   Socioeconomic History   Marital status: Widowed    Spouse name: Not  on file   Number of children: 3   Years of education: Not on file   Highest education level: 9th grade  Occupational History   Not on file  Tobacco Use   Smoking status: Every Day    Current packs/day: 2.00    Average packs/day: 2.0 packs/day for 47.5 years (95.1 ttl pk-yrs)    Types: Cigarettes    Start date: 1977    Passive exposure: Current   Smokeless tobacco: Never   Tobacco comments:    1ppd as of 07/30/22 ep  Vaping Use   Vaping status: Never Used  Substance and Sexual Activity   Alcohol use: Not Currently   Drug use: Not Currently    Types: "Crack" cocaine    Comment:  "nothing since  01/12/2012"   Sexual activity: Not Currently    Partners: Male  Other Topics Concern   Not on file  Social History Narrative   Not on file   Social Determinants of Health   Financial Resource Strain: High Risk (08/30/2022)   Overall Financial Resource Strain (CARDIA)    Difficulty of Paying Living Expenses: Hard  Food Insecurity: No Food Insecurity (09/08/2022)   Hunger Vital Sign    Worried About Running Out of Food in the Last Year: Never true    Ran Out of Food in the Last Year: Never true  Recent Concern: Food Insecurity - Food Insecurity Present (08/12/2022)   Hunger Vital Sign    Worried About Running Out of Food in the Last Year: Sometimes true    Ran Out of Food in the Last Year: Sometimes true  Transportation Needs: No Transportation Needs (09/08/2022)   PRAPARE - Administrator, Civil Service (Medical): No    Lack of Transportation (Non-Medical): No  Recent Concern: Transportation Needs - Unmet Transportation Needs (08/30/2022)   PRAPARE - Transportation    Lack of Transportation (Medical): Yes    Lack of Transportation (Non-Medical): Yes  Physical Activity: Inactive (08/30/2022)   Exercise Vital Sign    Days of Exercise per Week: 0 days    Minutes of Exercise per Session: 0 min  Stress: No Stress Concern Present (08/30/2022)   [employer] of Occupational  Health - Occupational Stress Questionnaire    Feeling of Stress : Only a little  Recent Concern: Stress - Stress Concern Present (08/12/2022)   [employer] of Occupational Health - Occupational Stress Questionnaire    Feeling of Stress : Very much  Social Connections: Moderately Isolated (08/30/2022)   Social Connection and Isolation Panel [NHANES]    Frequency of Communication with Friends and Family: Once a week    Frequency of Social Gatherings with Friends and Family: Once a week    Attends Religious Services: 1 to 4 times per year    Active Member of [employer] or Organizations: Yes    Attends Banker Meetings: 1 to 4 times per year    Marital Status: Widowed   Family History  Problem Relation Age of Onset   Diabetes Mother    Hypertension Mother    Hypertension Father    Cancer Father        lung   Breast cancer Neg Hx    Scheduled Meds:  albuterol  2.5 mg Nebulization TID   atenolol  25 mg Oral Daily   atorvastatin  20 mg Oral Daily   enoxaparin (LOVENOX) injection  40 mg Subcutaneous Q24H   insulin aspart  0-5 Units Subcutaneous QHS   insulin aspart  0-9 Units Subcutaneous TID WC   levothyroxine  25 mcg Oral QAC breakfast   montelukast  10 mg Oral QHS   olopatadine  1 drop Both Eyes BID   pantoprazole  40 mg Oral Daily   potassium chloride  40 mEq Oral BID   QUEtiapine  100 mg Oral QHS   roflumilast  500 mcg Oral Daily   traZODone  100 mg Oral QHS   Continuous Infusions:  cefTRIAXone (ROCEPHIN)  IV 1 g (09/09/22 0343)   vancomycin 1,000 mg (09/09/22 0558)   PRN Meds:.acetaminophen **OR** acetaminophen, albuterol, fentaNYL (SUBLIMAZE) injection, ipratropium-albuterol, ondansetron **OR** ondansetron (ZOFRAN) IV, polyethylene glycol Medications Prior to Admission:  Prior to Admission medications   Medication Sig Start Date End Date Taking? Authorizing Provider  acyclovir (  ZOVIRAX) 400 MG tablet TAKE 1 TABLET BY MOUTH TWICE  DAILY 08/14/22  Yes Myrlene Broker, MD  atenolol (TENORMIN) 25 MG tablet TAKE 1 TABLET BY MOUTH  DAILY 01/28/22  Yes Myrlene Broker, MD  atorvastatin (LIPITOR) 20 MG tablet TAKE 1 TABLET BY MOUTH ONCE  DAILY 07/23/22  Yes Myrlene Broker, MD  Budeson-Glycopyrrol-Formoterol (BREZTRI AEROSPHERE) 160-9-4.8 MCG/ACT AERO Inhale 2 puffs into the lungs in the morning and at bedtime. 05/08/21  Yes Oretha Milch, MD  buPROPion (WELLBUTRIN XL) 300 MG 24 hr tablet TAKE 1 TABLET BY MOUTH DAILY 06/04/22  Yes Thresa Ross, MD  busPIRone (BUSPAR) 5 MG tablet Take 1 tablet (5 mg total) by mouth at bedtime. 06/17/22  Yes Thresa Ross, MD  levofloxacin (LEVAQUIN) 500 MG tablet Take 1 tablet (500 mg total) by mouth daily for 5 days. 09/09/22 09/14/22 Yes Shah, Pratik D, DO  levothyroxine (SYNTHROID) 25 MCG tablet TAKE 1 TABLET BY MOUTH DAILY  BEFORE BREAKFAST 07/23/22  Yes Myrlene Broker, MD  montelukast (SINGULAIR) 10 MG tablet TAKE 1 TABLET BY MOUTH AT  BEDTIME 07/23/22  Yes Oretha Milch, MD  naproxen (NAPROSYN) 500 MG tablet Take 1 tablet (500 mg total) by mouth 2 (two) times daily with a meal. 03/29/22  Yes Myrlene Broker, MD  pantoprazole (PROTONIX) 40 MG tablet TAKE 1 TABLET BY MOUTH DAILY 12/21/21  Yes Myrlene Broker, MD  QUEtiapine (SEROQUEL) 100 MG tablet TAKE 1 TABLET BY MOUTH AT  BEDTIME 07/31/22  Yes Thresa Ross, MD  traZODone (DESYREL) 100 MG tablet TAKE 1 TABLET BY MOUTH AT  BEDTIME 12/21/21  Yes Myrlene Broker, MD  Accu-Chek Softclix Lancets lancets Use as instructed 05/21/21   Myrlene Broker, MD  albuterol (PROVENTIL) (2.5 MG/3ML) 0.083% nebulizer solution Take 3 mLs (2.5 mg total) by nebulization 2 (two) times daily. And every 6 hours as needed.  DX: J44.9 01/05/21   Bevelyn Ngo, NP  Blood Glucose Monitoring Suppl DEVI 1 each by Does not apply route in the morning, at noon, and at bedtime. May substitute to any manufacturer covered by patient's insurance. 08/28/22   Myrlene Broker, MD  cyclobenzaprine (FLEXERIL) 10 MG tablet TAKE 1 TABLET BY MOUTH AT  BEDTIME 07/31/22   Myrlene Broker, MD  diazepam (VALIUM) 5 MG tablet Take 1 tablet (5 mg total) by mouth every 8 (eight) hours as needed for anxiety. 02/15/22 02/15/23  Bedelia Person, MD  diphenhydrAMINE (BENADRYL) 25 mg capsule Take 25 mg by mouth 2 (two) times daily.    [provider]  fluticasone (FLONASE) 50 MCG/ACT nasal spray Place 2 sprays into both nostrils daily. Patient taking differently: Place 2 sprays into both nostrils daily as needed for allergies. 09/05/21 02/07/22  Alfonse Spruce, MD  Glucose Blood (BLOOD GLUCOSE TEST STRIPS) STRP 1 each by In Vitro route in the morning, at noon, and at bedtime. May substitute to any manufacturer covered by patient's insurance. 08/28/22 09/27/22  Myrlene Broker, MD  guaiFENesin-Codeine 225-7.5 MG/5ML LIQD Take 5 mLs by mouth 3 (three) times daily as needed. 07/30/22   Oretha Milch, MD  hydrochlorothiazide (HYDRODIURIL) 25 MG tablet TAKE 1 TABLET BY MOUTH DAILY 07/23/22   Myrlene Broker, MD  Ipratropium-Albuterol (COMBIVENT RESPIMAT) 20-100 MCG/ACT AERS respimat Inhale 1 puff into the lungs every 6 (six) hours as needed for wheezing or shortness of breath. 04/02/22   Bevelyn Ngo, NP  Lancet Device MISC 1 each  by Does not apply route in the morning, at noon, and at bedtime. May substitute to any manufacturer covered by patient's insurance. 08/13/22 09/12/22  Myrlene Broker, MD  Lancets Misc. MISC 1 each by Does not apply route in the morning, at noon, and at bedtime. May substitute to any manufacturer covered by patient's insurance. 08/28/22 09/27/22  Myrlene Broker, MD  MELATONIN PO Take 1 tablet by mouth at bedtime as needed (sleep).    [provider]  nystatin-triamcinolone ointment (MYCOLOG) Apply 1 application topically 2 (two) times daily as needed (applied to skin folds/groins/under breast skin irritation rash.).  09/08/17   Myrlene Broker, MD  OXYGEN Inhale 3 L/min into the lungs See admin instructions. Inhale  3 L/min of oxygen into the lungs w/CPAP at bedtime and as needed for shortness of breath with "strenuous activity" during the day    [provider]  PATADAY 0.2 % SOLN Place 1 drop into both eyes daily. 08/20/19   [provider]  polyethylene glycol (MIRALAX / GLYCOLAX) 17 g packet Take 17 g by mouth daily as needed for mild constipation. 04/14/19   Meuth, Brooke A, PA-C  pregabalin (LYRICA) 300 MG capsule Take 1 capsule (300 mg total) by mouth 2 (two) times daily. 03/29/22   Myrlene Broker, MD  roflumilast (DALIRESP) 500 MCG TABS tablet Take 1 tablet (500 mcg total) by mouth daily. 04/02/22   Bevelyn Ngo, NP  triamcinolone cream (KENALOG) 0.1 % Apply 1 Application topically 2 (two) times daily. 08/20/22   Myrlene Broker, MD  Varenicline Tartrate, Starter, (CHANTIX STARTING MONTH PAK) 0.5 MG X 11 & 1 MG X 42 TBPK Chantix started pack Take 0.5 mg once daily x 3 days, then take 0.5 mg twice daily x 4 days, then take 1 mg twice daily thereafter. Patient not taking: Reported on 09/08/2022 04/12/22   Bevelyn Ngo, NP  venlafaxine XR (EFFEXOR-XR) 150 MG 24 hr capsule TAKE 1 CAPSULE BY MOUTH DAILY  WITH BREAKFAST 12/21/21   Myrlene Broker, MD  witch hazel-glycerin (TUCKS) pad Apply topically as needed for itching. 08/18/20   Standley Brooking, MD   Allergies  Allergen Reactions   Keflex [Cephalexin] Rash and Other (See Comments)    Has tolerated amoxicillin since had keflex   Shellfish Allergy Shortness Of Breath, Nausea And Vomiting and Other (See Comments)    Stomach cramps   Tetracyclines & Related Anaphylaxis, Swelling and Other (See Comments)    Throat swelling requiring hospitalization   Dilaudid [Hydromorphone Hcl] Other (See Comments)    "Lethargy"   Prednisone Other (See Comments)    "counter reacts", has tolerated IV medrol   Clindamycin/Lincomycin  Other (See Comments)    Burning sensation in her mouth down her throat    Incruse Ellipta [Umeclidinium Bromide] Nausea Only   Molds & Smuts Itching   Tuberculin Tests Other (See Comments)    False postive   Umeclidinium     Other Reaction(s): GI Intolerance   Review of Systems  Unable to perform ROS: Other    Physical Exam Vitals and nursing note reviewed.  Constitutional:      General: She is not in acute distress.    Appearance: She is ill-appearing.  HENT:     Mouth/Throat:     Mouth: Mucous membranes are dry.  Cardiovascular:     Rate and Rhythm: Normal rate.  Pulmonary:     Effort: Pulmonary effort is normal. No respiratory distress.  Musculoskeletal:  General: No swelling.  Skin:    General: Skin is warm and dry.     Findings: Bruising and erythema present.  Neurological:     Mental Status: She is alert and oriented to person, place, and time.  Psychiatric:        Mood and Affect: Mood normal.        Behavior: Behavior normal.     Vital Signs: BP 131/67 (BP Location: Right Arm)   Pulse 68   Temp 98.6 F (37 C)   Resp 20   Ht 5\' 8"  (1.727 m)   Wt 91 kg   SpO2 100%   BMI 30.50 kg/m  Pain Scale: 0-10   Pain Score: Asleep   SpO2: SpO2: 100 % O2 Device:SpO2: 100 % O2 Flow Rate: .O2 Flow Rate (L/min): 1 L/min  IO: Intake/output summary:  Intake/Output Summary (Last 24 hours) at 09/09/2022 1338 Last data filed at 09/09/2022 0809 Gross per 24 hour  Intake 660 ml  Output --  Net 660 ml    LBM: Last BM Date : 09/08/22 Baseline Weight: Weight: 91 kg Most recent weight: Weight: 91 kg     Palliative Assessment/Data:     Time In: 1120 Time Out: 1215 Time Total: 55 minutes  Greater than 50%  of this time was spent counseling and coordinating care related to the above assessment and plan.  Signed by: Katheran Awe, NP   Please contact Palliative Medicine Team phone at [phone] for questions and concerns.  For individual provider: See  Loretha Stapler

## 2022-09-09 NOTE — Care Management Important Message (Signed)
Important Message  Patient Details  Name: Joan [name] MRN: [id number] Date of Birth: Aug 08, 1960   Medicare Important Message Given:  N/A - LOS <3 / Initial given by admissions     Corey Harold 09/09/2022, 1:30 PM

## 2022-09-09 NOTE — Plan of Care (Signed)
  Problem: Clinical Measurements: Goal: Diagnostic test results will improve Outcome: Progressing Goal: Respiratory complications will improve Outcome: Progressing   

## 2022-09-09 NOTE — Care Management CC44 (Signed)
Condition Code 44 Documentation Completed  Patient Details  Name: Maggie Senseney MRN: [id number] Date of Birth: [birth year]   Condition Code 44 given:  Yes Patient signature on Condition Code 44 notice:  Yes Documentation of 2 MD's agreement:  Yes Code 44 added to claim:  Yes    Leitha Bleak, RN 09/09/2022, 2:20 PM

## 2022-09-09 NOTE — TOC Transition Note (Signed)
Transition of Care [facility]) - CM/SW Discharge Note   Patient Details  Name: Joan [name] MRN: [id number] Date of Birth: 09-Aug-1960  [facility] CM/SW Contact:  Leitha Bleak, RN Phone Number: 09/09/2022, 1:13 PM   Clinical Narrative:   Patient is active with [facility] at home.Palliative consulted. Patient is wanting to go home. Hospice RN visited here, they are agreeable with patient going home. They did discuss placement for hospice care, patient is agreeable to have their Social worker come out to discuss placement. Team updated.    Final next level of care: Home w Hospice Care Barriers to Discharge: Barriers Resolved   Patient Goals and CMS Choice CMS Medicare.gov Compare Post Acute Care list provided to:: Patient Represenative (must comment)   Discharge Placement       Patient and family notified of of transfer: 09/09/22  Discharge Plan and Services Additional resources added to the After Visit Summary for          [facility] Agency: [facility] Date [facility] Agency Contacted: 09/09/22 Time HH Agency Contacted: 1313 Representative spoke with at [facility] Agency: Nurse  Social Determinants of Health (SDOH) Interventions SDOH Screenings   Food Insecurity: No Food Insecurity (09/08/2022)  Recent Concern: Food Insecurity - Food Insecurity Present (08/12/2022)  Housing: Low Risk  (09/08/2022)  Transportation Needs: No Transportation Needs (09/08/2022)  Recent Concern: Transportation Needs - Unmet Transportation Needs (08/30/2022)  Utilities: Not At Risk (09/08/2022)  Alcohol Screen: Low Risk  (08/30/2022)  Depression (PHQ2-9): Low Risk  (08/30/2022)  Financial Resource Strain: High Risk (08/30/2022)  Physical Activity: Inactive (08/30/2022)  Social Connections: Moderately Isolated (08/30/2022)  Stress: No Stress Concern Present (08/30/2022)  Recent Concern: Stress - Stress Concern Present (08/12/2022)  Tobacco Use: High Risk (09/07/2022)  Health Literacy: Low Risk   (02/18/2022)   Received from [facility], [facility]     Readmission Risk Interventions     No data to display

## 2022-09-10 ENCOUNTER — Emergency Department (HOSPITAL_COMMUNITY)

## 2022-09-10 ENCOUNTER — Inpatient Hospital Stay (HOSPITAL_COMMUNITY)
Admission: EM | Admit: 2022-09-10 | Discharge: 2022-09-13 | DRG: 603 | Disposition: A | Discharge status: Hospice | Attending: Internal Medicine | Admitting: Internal Medicine

## 2022-09-10 ENCOUNTER — Other Ambulatory Visit: Payer: Self-pay

## 2022-09-10 DIAGNOSIS — J439 Emphysema, unspecified: Secondary | ICD-10-CM | POA: Diagnosis present

## 2022-09-10 DIAGNOSIS — F411 Generalized anxiety disorder: Secondary | ICD-10-CM | POA: Diagnosis present

## 2022-09-10 DIAGNOSIS — Z515 Encounter for palliative care: Secondary | ICD-10-CM

## 2022-09-10 DIAGNOSIS — I10 Essential (primary) hypertension: Secondary | ICD-10-CM | POA: Diagnosis present

## 2022-09-10 DIAGNOSIS — E876 Hypokalemia: Secondary | ICD-10-CM | POA: Diagnosis present

## 2022-09-10 DIAGNOSIS — Z9981 Dependence on supplemental oxygen: Secondary | ICD-10-CM

## 2022-09-10 DIAGNOSIS — J9611 Chronic respiratory failure with hypoxia: Secondary | ICD-10-CM | POA: Diagnosis present

## 2022-09-10 DIAGNOSIS — Z8619 Personal history of other infectious and parasitic diseases: Secondary | ICD-10-CM

## 2022-09-10 DIAGNOSIS — J441 Chronic obstructive pulmonary disease with (acute) exacerbation: Secondary | ICD-10-CM

## 2022-09-10 DIAGNOSIS — Z555 Less than a high school diploma: Secondary | ICD-10-CM

## 2022-09-10 DIAGNOSIS — J4489 Other specified chronic obstructive pulmonary disease: Secondary | ICD-10-CM | POA: Diagnosis present

## 2022-09-10 DIAGNOSIS — R54 Age-related physical debility: Secondary | ICD-10-CM | POA: Diagnosis present

## 2022-09-10 DIAGNOSIS — Z881 Allergy status to other antibiotic agents status: Secondary | ICD-10-CM

## 2022-09-10 DIAGNOSIS — Z6830 Body mass index (BMI) 30.0-30.9, adult: Secondary | ICD-10-CM

## 2022-09-10 DIAGNOSIS — L03115 Cellulitis of right lower limb: Principal | ICD-10-CM | POA: Diagnosis present

## 2022-09-10 DIAGNOSIS — Z8249 Family history of ischemic heart disease and other diseases of the circulatory system: Secondary | ICD-10-CM

## 2022-09-10 DIAGNOSIS — Z5982 Transportation insecurity: Secondary | ICD-10-CM

## 2022-09-10 DIAGNOSIS — F17210 Nicotine dependence, cigarettes, uncomplicated: Secondary | ICD-10-CM | POA: Diagnosis present

## 2022-09-10 DIAGNOSIS — G4733 Obstructive sleep apnea (adult) (pediatric): Secondary | ICD-10-CM | POA: Diagnosis present

## 2022-09-10 DIAGNOSIS — E669 Obesity, unspecified: Secondary | ICD-10-CM | POA: Insufficient documentation

## 2022-09-10 DIAGNOSIS — Z79899 Other long term (current) drug therapy: Secondary | ICD-10-CM

## 2022-09-10 DIAGNOSIS — E782 Mixed hyperlipidemia: Secondary | ICD-10-CM | POA: Diagnosis present

## 2022-09-10 DIAGNOSIS — F319 Bipolar disorder, unspecified: Secondary | ICD-10-CM | POA: Diagnosis present

## 2022-09-10 DIAGNOSIS — E1140 Type 2 diabetes mellitus with diabetic neuropathy, unspecified: Secondary | ICD-10-CM | POA: Diagnosis present

## 2022-09-10 DIAGNOSIS — Z5941 Food insecurity: Secondary | ICD-10-CM

## 2022-09-10 DIAGNOSIS — Z91013 Allergy to seafood: Secondary | ICD-10-CM

## 2022-09-10 DIAGNOSIS — K219 Gastro-esophageal reflux disease without esophagitis: Secondary | ICD-10-CM | POA: Diagnosis present

## 2022-09-10 DIAGNOSIS — Z981 Arthrodesis status: Secondary | ICD-10-CM

## 2022-09-10 DIAGNOSIS — Z833 Family history of diabetes mellitus: Secondary | ICD-10-CM

## 2022-09-10 DIAGNOSIS — E039 Hypothyroidism, unspecified: Secondary | ICD-10-CM | POA: Diagnosis present

## 2022-09-10 DIAGNOSIS — E11649 Type 2 diabetes mellitus with hypoglycemia without coma: Secondary | ICD-10-CM | POA: Diagnosis present

## 2022-09-10 DIAGNOSIS — X58XXXA Exposure to other specified factors, initial encounter: Secondary | ICD-10-CM | POA: Diagnosis present

## 2022-09-10 DIAGNOSIS — Z888 Allergy status to other drugs, medicaments and biological substances status: Secondary | ICD-10-CM

## 2022-09-10 DIAGNOSIS — Z5986 Financial insecurity: Secondary | ICD-10-CM

## 2022-09-10 DIAGNOSIS — Z96643 Presence of artificial hip joint, bilateral: Secondary | ICD-10-CM | POA: Diagnosis present

## 2022-09-10 DIAGNOSIS — F4310 Post-traumatic stress disorder, unspecified: Secondary | ICD-10-CM | POA: Diagnosis present

## 2022-09-10 DIAGNOSIS — S90421A Blister (nonthermal), right great toe, initial encounter: Secondary | ICD-10-CM | POA: Diagnosis present

## 2022-09-10 DIAGNOSIS — Z79890 Hormone replacement therapy (postmenopausal): Secondary | ICD-10-CM

## 2022-09-10 LAB — BASIC METABOLIC PANEL
Anion gap: 8 (ref 5–15)
BUN: 14 mg/dL (ref 6–20)
CO2: 30 mmol/L (ref 22–32)
Calcium: 8.8 mg/dL — ABNORMAL LOW (ref 8.9–10.3)
Chloride: 103 mmol/L (ref 98–111)
Creatinine, Ser: 1.01 mg/dL — ABNORMAL HIGH (ref 0.44–1.00)
GFR, Estimated: >60 mL/min (ref >60)
Glucose, Bld: 102 mg/dL — ABNORMAL HIGH (ref 70–99)
Potassium: 3.4 mmol/L — ABNORMAL LOW (ref 3.5–5.1)
Sodium: 141 mmol/L (ref 135–145)

## 2022-09-10 LAB — CBC
HCT: 38 % (ref 36.0–46.0)
Hemoglobin: 12 g/dL (ref 12.0–15.0)
MCH: 28.8 pg (ref 26.0–34.0)
MCHC: 31.6 g/dL (ref 30.0–36.0)
MCV: 91.1 fL (ref 80.0–100.0)
Platelets: 262 10*3/uL (ref 150–400)
RBC: 4.17 MIL/uL (ref 3.87–5.11)
RDW: 17.5 % — ABNORMAL HIGH (ref 11.5–15.5)
WBC: 8.3 10*3/uL (ref 4.0–10.5)
nRBC: 0 % (ref 0.0–0.2)

## 2022-09-10 NOTE — ED Notes (Signed)
Patient transported to X-ray 

## 2022-09-10 NOTE — ED Provider Notes (Signed)
[facility] AT [facility] [place] Provider Note   CSN: [id number] Arrival date & time: 09/10/22  2041     History  Chief Complaint  Patient presents with   Shortness of Breath    Joan [name] is a 62 y.o. female.  Patient is a 62 year old female with past medical history of end-stage COPD on home oxygen, hypertension, anemia, hide anemia, type diabetes, and recent admission for cellulitis.  Patient was discharged yesterday.  She tells me she left at her request.  Since returning home, she has felt more short of breath and states that her leg is having worsening pain and redness.  She feels as though she needs to be back in the hospital for IV antibiotics.  Patient denies fevers or chills.  He denies chest pain.  The history is provided by the patient.       Home Medications Prior to Admission medications   Medication Sig Start Date End Date Taking? Authorizing Provider  Accu-Chek Softclix Lancets lancets Use as instructed 05/21/21   Myrlene Broker, MD  acyclovir (ZOVIRAX) 400 MG tablet TAKE 1 TABLET BY MOUTH TWICE  DAILY 08/14/22   Myrlene Broker, MD  albuterol (PROVENTIL) (2.5 MG/3ML) 0.083% nebulizer solution Take 3 mLs (2.5 mg total) by nebulization 2 (two) times daily. And every 6 hours as needed.  DX: J44.9 01/05/21   Bevelyn Ngo, NP  atenolol (TENORMIN) 25 MG tablet TAKE 1 TABLET BY MOUTH  DAILY 01/28/22   Myrlene Broker, MD  atorvastatin (LIPITOR) 20 MG tablet TAKE 1 TABLET BY MOUTH ONCE  DAILY 07/23/22   Myrlene Broker, MD  Blood Glucose Monitoring Suppl DEVI 1 each by Does not apply route in the morning, at noon, and at bedtime. May substitute to any manufacturer covered by patient's insurance. 08/28/22   Myrlene Broker, MD  Budeson-Glycopyrrol-Formoterol (BREZTRI AEROSPHERE) 160-9-4.8 MCG/ACT AERO Inhale 2 puffs into the lungs in the morning and at bedtime. 05/08/21   Oretha Milch, MD  buPROPion (WELLBUTRIN XL)  300 MG 24 hr tablet TAKE 1 TABLET BY MOUTH DAILY 06/04/22   Thresa Ross, MD  busPIRone (BUSPAR) 5 MG tablet Take 1 tablet (5 mg total) by mouth at bedtime. 06/17/22   Thresa Ross, MD  cyclobenzaprine (FLEXERIL) 10 MG tablet TAKE 1 TABLET BY MOUTH AT  BEDTIME 07/31/22   Myrlene Broker, MD  diazepam (VALIUM) 5 MG tablet Take 1 tablet (5 mg total) by mouth every 8 (eight) hours as needed for anxiety. 02/15/22 02/15/23  Bedelia Person, MD  diphenhydrAMINE (BENADRYL) 25 mg capsule Take 25 mg by mouth 2 (two) times daily.    [provider]  fluticasone (FLONASE) 50 MCG/ACT nasal spray Place 2 sprays into both nostrils daily. Patient taking differently: Place 2 sprays into both nostrils daily as needed for allergies. 09/05/21 02/07/22  Alfonse Spruce, MD  Glucose Blood (BLOOD GLUCOSE TEST STRIPS) STRP 1 each by In Vitro route in the morning, at noon, and at bedtime. May substitute to any manufacturer covered by patient's insurance. 08/28/22 09/27/22  Myrlene Broker, MD  guaiFENesin-Codeine 225-7.5 MG/5ML LIQD Take 5 mLs by mouth 3 (three) times daily as needed. 07/30/22   Oretha Milch, MD  hydrochlorothiazide (HYDRODIURIL) 25 MG tablet TAKE 1 TABLET BY MOUTH DAILY 07/23/22   Myrlene Broker, MD  Ipratropium-Albuterol (COMBIVENT RESPIMAT) 20-100 MCG/ACT AERS respimat Inhale 1 puff into the lungs every 6 (six) hours as needed for wheezing or shortness  of breath. 04/02/22   Bevelyn Ngo, NP  Lancet Device MISC 1 each by Does not apply route in the morning, at noon, and at bedtime. May substitute to any manufacturer covered by patient's insurance. 08/13/22 09/12/22  Myrlene Broker, MD  Lancets Misc. MISC 1 each by Does not apply route in the morning, at noon, and at bedtime. May substitute to any manufacturer covered by patient's insurance. 08/28/22 09/27/22  Myrlene Broker, MD  levofloxacin (LEVAQUIN) 500 MG tablet Take 1 tablet (500 mg total) by mouth daily for 5 days.  09/09/22 09/14/22  Sherryll Burger, Pratik D, DO  levothyroxine (SYNTHROID) 25 MCG tablet TAKE 1 TABLET BY MOUTH DAILY  BEFORE BREAKFAST 07/23/22   Myrlene Broker, MD  MELATONIN PO Take 1 tablet by mouth at bedtime as needed (sleep).    [provider]  montelukast (SINGULAIR) 10 MG tablet TAKE 1 TABLET BY MOUTH AT  BEDTIME 07/23/22   Oretha Milch, MD  naproxen (NAPROSYN) 500 MG tablet Take 1 tablet (500 mg total) by mouth 2 (two) times daily with a meal. 03/29/22   Myrlene Broker, MD  nystatin-triamcinolone ointment [facility]) Apply 1 application topically 2 (two) times daily as needed (applied to skin folds/groins/under breast skin irritation rash.). 09/08/17   Myrlene Broker, MD  OXYGEN Inhale 3 L/min into the lungs See admin instructions. Inhale  3 L/min of oxygen into the lungs w/CPAP at bedtime and as needed for shortness of breath with "strenuous activity" during the day    [provider]  pantoprazole (PROTONIX) 40 MG tablet TAKE 1 TABLET BY MOUTH DAILY 12/21/21   Myrlene Broker, MD  PATADAY 0.2 % SOLN Place 1 drop into both eyes daily. 08/20/19   [provider]  polyethylene glycol (MIRALAX / GLYCOLAX) 17 g packet Take 17 g by mouth daily as needed for mild constipation. 04/14/19   Meuth, Brooke A, PA-C  pregabalin (LYRICA) 300 MG capsule Take 1 capsule (300 mg total) by mouth 2 (two) times daily. 03/29/22   Myrlene Broker, MD  QUEtiapine (SEROQUEL) 100 MG tablet TAKE 1 TABLET BY MOUTH AT  BEDTIME 07/31/22   Thresa Ross, MD  roflumilast (DALIRESP) 500 MCG TABS tablet Take 1 tablet (500 mcg total) by mouth daily. 04/02/22   Bevelyn Ngo, NP  traZODone (DESYREL) 100 MG tablet TAKE 1 TABLET BY MOUTH AT  BEDTIME 12/21/21   Myrlene Broker, MD  triamcinolone cream (KENALOG) 0.1 % Apply 1 Application topically 2 (two) times daily. 08/20/22   Myrlene Broker, MD  Varenicline Tartrate, Starter, (CHANTIX STARTING MONTH PAK) 0.5 MG X 11 & 1 MG X 42  TBPK Chantix started pack Take 0.5 mg once daily x 3 days, then take 0.5 mg twice daily x 4 days, then take 1 mg twice daily thereafter. Patient not taking: Reported on 09/08/2022 04/12/22   Bevelyn Ngo, NP  venlafaxine XR (EFFEXOR-XR) 150 MG 24 hr capsule TAKE 1 CAPSULE BY MOUTH DAILY  WITH BREAKFAST 12/21/21   Myrlene Broker, MD  witch hazel-glycerin (TUCKS) pad Apply topically as needed for itching. 08/18/20   Standley Brooking, MD      Allergies    Keflex [cephalexin], Shellfish allergy, Tetracyclines & related, Dilaudid [hydromorphone hcl], Prednisone, Clindamycin/lincomycin, Incruse ellipta [umeclidinium bromide], Molds & smuts, Tuberculin tests, and Umeclidinium    Review of Systems   Review of Systems  All other systems reviewed and are negative.   Physical Exam Updated Vital Signs  BP 127/77   Pulse 78   Temp 98 F (36.7 C) (Oral)   Resp 20   SpO2 100%  Physical Exam Vitals and nursing note reviewed.  Constitutional:      General: She is not in acute distress.    Appearance: She is well-developed. She is not diaphoretic.  HENT:     Head: Normocephalic and atraumatic.  Cardiovascular:     Rate and Rhythm: Normal rate and regular rhythm.     Heart sounds: No murmur heard.    No friction rub. No gallop.  Pulmonary:     Effort: Pulmonary effort is normal. No respiratory distress.     Breath sounds: Normal breath sounds. No wheezing.  Abdominal:     General: Bowel sounds are normal. There is no distension.     Palpations: Abdomen is soft.     Tenderness: There is no abdominal tenderness.  Musculoskeletal:        General: Normal range of motion.     Cervical back: Normal range of motion and neck supple.  Skin:    General: Skin is warm and dry.     Comments: There is warmth and erythema noted to the right lower extremity in a stocking distribution.  There is also a blister noted to the dorsal aspect of the right great toe.  DP pulses are palpable.  Neurological:      General: No focal deficit present.     Mental Status: She is alert and oriented to person, place, and time.     ED Results / Procedures / Treatments   Labs (all labs ordered are listed, but only abnormal results are displayed) Labs Reviewed  BASIC METABOLIC PANEL - Abnormal; Notable for the following components:      Result Value   Potassium 3.4 (*)    Glucose, Bld 102 (*)    Creatinine, Ser 1.01 (*)    Calcium 8.8 (*)    All other components within normal limits  CBC - Abnormal; Notable for the following components:   RDW 17.5 (*)    All other components within normal limits    EKG None  Radiology DG Chest 2 View  Result Date: 09/10/2022 CLINICAL DATA:  COPD. Shortness of breath and bilateral lower edema. EXAM: CHEST - 2 VIEW COMPARISON:  07/31/2022 FINDINGS: Emphysematous changes in the lungs. Heart size and pulmonary vascularity are normal. Peribronchial thickening suggesting chronic bronchitis. No airspace disease or consolidation. Prominent left cardiac fat pad. No pleural effusions. No pneumothorax. Mediastinal contours appear intact. Calcification of the aorta. IMPRESSION: Emphysematous and chronic bronchitic changes in the lungs. No evidence of active pulmonary disease. Electronically Signed   By: Burman Nieves M.D.   On: 09/10/2022 22:01    Procedures Procedures  {Document cardiac monitor, telemetry assessment procedure when appropriate:1}  Medications Ordered in ED Medications - No data to display  ED Course/ Medical Decision Making/ A&P   {   Click here for ABCD2, HEART and other calculatorsREFRESH Note before signing :1}                          Medical Decision Making Amount and/or Complexity of Data Reviewed Labs: ordered. Radiology: ordered.   ***  {Document critical care time when appropriate:1} {Document review of labs and clinical decision tools ie heart score, Chads2Vasc2 etc:1}  {Document your independent review of radiology images, and  any outside records:1} {Document your discussion with family members, caretakers, and with consultants:1} {Document  social determinants of health affecting pt's care:1} {Document your decision making why or why not admission, treatments were needed:1} Final Clinical Impression(s) / ED Diagnoses Final diagnoses:  None    Rx / DC Orders ED Discharge Orders     None

## 2022-09-10 NOTE — ED Triage Notes (Addendum)
Pt bib [facility] from home c/o SOB and bilateral lower edema. Per EMS Pt was d/c from this facility yesterday with cellulitis of right foot.  Pt on 3L [place] at home with sats in mid 80s on EMS arrival.    Pt 98% on 3L [place] in triage. Pt c/o of pain in right foot.

## 2022-09-11 ENCOUNTER — Encounter (HOSPITAL_COMMUNITY): Payer: Self-pay | Admitting: Internal Medicine

## 2022-09-11 DIAGNOSIS — Z8249 Family history of ischemic heart disease and other diseases of the circulatory system: Secondary | ICD-10-CM | POA: Diagnosis not present

## 2022-09-11 DIAGNOSIS — F4310 Post-traumatic stress disorder, unspecified: Secondary | ICD-10-CM | POA: Diagnosis present

## 2022-09-11 DIAGNOSIS — X58XXXA Exposure to other specified factors, initial encounter: Secondary | ICD-10-CM | POA: Diagnosis present

## 2022-09-11 DIAGNOSIS — E669 Obesity, unspecified: Secondary | ICD-10-CM | POA: Insufficient documentation

## 2022-09-11 DIAGNOSIS — E119 Type 2 diabetes mellitus without complications: Secondary | ICD-10-CM | POA: Diagnosis not present

## 2022-09-11 DIAGNOSIS — Z881 Allergy status to other antibiotic agents status: Secondary | ICD-10-CM | POA: Diagnosis not present

## 2022-09-11 DIAGNOSIS — E11649 Type 2 diabetes mellitus with hypoglycemia without coma: Secondary | ICD-10-CM | POA: Diagnosis present

## 2022-09-11 DIAGNOSIS — E039 Hypothyroidism, unspecified: Secondary | ICD-10-CM | POA: Diagnosis present

## 2022-09-11 DIAGNOSIS — J4489 Other specified chronic obstructive pulmonary disease: Secondary | ICD-10-CM | POA: Diagnosis present

## 2022-09-11 DIAGNOSIS — Z9981 Dependence on supplemental oxygen: Secondary | ICD-10-CM | POA: Diagnosis not present

## 2022-09-11 DIAGNOSIS — Z6830 Body mass index (BMI) 30.0-30.9, adult: Secondary | ICD-10-CM | POA: Diagnosis not present

## 2022-09-11 DIAGNOSIS — Z515 Encounter for palliative care: Secondary | ICD-10-CM | POA: Diagnosis not present

## 2022-09-11 DIAGNOSIS — G4733 Obstructive sleep apnea (adult) (pediatric): Secondary | ICD-10-CM | POA: Diagnosis present

## 2022-09-11 DIAGNOSIS — E782 Mixed hyperlipidemia: Secondary | ICD-10-CM | POA: Diagnosis present

## 2022-09-11 DIAGNOSIS — J9611 Chronic respiratory failure with hypoxia: Secondary | ICD-10-CM | POA: Diagnosis present

## 2022-09-11 DIAGNOSIS — L03115 Cellulitis of right lower limb: Secondary | ICD-10-CM | POA: Diagnosis present

## 2022-09-11 DIAGNOSIS — F319 Bipolar disorder, unspecified: Secondary | ICD-10-CM | POA: Diagnosis present

## 2022-09-11 DIAGNOSIS — F411 Generalized anxiety disorder: Secondary | ICD-10-CM | POA: Diagnosis present

## 2022-09-11 DIAGNOSIS — W1830XA Fall on same level, unspecified, initial encounter: Secondary | ICD-10-CM | POA: Diagnosis not present

## 2022-09-11 DIAGNOSIS — Z91013 Allergy to seafood: Secondary | ICD-10-CM | POA: Diagnosis not present

## 2022-09-11 DIAGNOSIS — Z888 Allergy status to other drugs, medicaments and biological substances status: Secondary | ICD-10-CM | POA: Diagnosis not present

## 2022-09-11 DIAGNOSIS — J441 Chronic obstructive pulmonary disease with (acute) exacerbation: Secondary | ICD-10-CM | POA: Diagnosis present

## 2022-09-11 DIAGNOSIS — E1140 Type 2 diabetes mellitus with diabetic neuropathy, unspecified: Secondary | ICD-10-CM | POA: Diagnosis present

## 2022-09-11 DIAGNOSIS — I10 Essential (primary) hypertension: Secondary | ICD-10-CM | POA: Diagnosis present

## 2022-09-11 DIAGNOSIS — R519 Headache, unspecified: Secondary | ICD-10-CM | POA: Diagnosis present

## 2022-09-11 DIAGNOSIS — J449 Chronic obstructive pulmonary disease, unspecified: Secondary | ICD-10-CM

## 2022-09-11 DIAGNOSIS — J439 Emphysema, unspecified: Secondary | ICD-10-CM | POA: Diagnosis present

## 2022-09-11 DIAGNOSIS — E876 Hypokalemia: Secondary | ICD-10-CM | POA: Diagnosis present

## 2022-09-11 DIAGNOSIS — F17210 Nicotine dependence, cigarettes, uncomplicated: Secondary | ICD-10-CM | POA: Diagnosis present

## 2022-09-11 DIAGNOSIS — K219 Gastro-esophageal reflux disease without esophagitis: Secondary | ICD-10-CM | POA: Diagnosis present

## 2022-09-11 DIAGNOSIS — Z7189 Other specified counseling: Secondary | ICD-10-CM | POA: Diagnosis not present

## 2022-09-11 LAB — CBC
HCT: 37.1 % (ref 36.0–46.0)
Hemoglobin: 11.7 g/dL — ABNORMAL LOW (ref 12.0–15.0)
MCH: 28.7 pg (ref 26.0–34.0)
MCHC: 31.5 g/dL (ref 30.0–36.0)
MCV: 90.9 fL (ref 80.0–100.0)
Platelets: 257 10*3/uL (ref 150–400)
RBC: 4.08 MIL/uL (ref 3.87–5.11)
RDW: 17.4 % — ABNORMAL HIGH (ref 11.5–15.5)
WBC: 7.7 10*3/uL (ref 4.0–10.5)
nRBC: 0 % (ref 0.0–0.2)

## 2022-09-11 LAB — GLUCOSE, CAPILLARY
Glucose-Capillary: 108 mg/dL — ABNORMAL HIGH (ref 70–99)
Glucose-Capillary: 118 mg/dL — ABNORMAL HIGH (ref 70–99)
Glucose-Capillary: 97 mg/dL (ref 70–99)
Glucose-Capillary: 119 mg/dL — ABNORMAL HIGH (ref 70–99)

## 2022-09-11 LAB — COMPREHENSIVE METABOLIC PANEL
ALT: 21 U/L (ref 0–44)
AST: 18 U/L (ref 15–41)
Albumin: 3.2 g/dL — ABNORMAL LOW (ref 3.5–5.0)
Alkaline Phosphatase: 99 U/L (ref 38–126)
Anion gap: 4 — ABNORMAL LOW (ref 5–15)
BUN: 14 mg/dL (ref 6–20)
CO2: 32 mmol/L (ref 22–32)
Calcium: 8.3 mg/dL — ABNORMAL LOW (ref 8.9–10.3)
Chloride: 105 mmol/L (ref 98–111)
Creatinine, Ser: 0.77 mg/dL (ref 0.44–1.00)
GFR, Estimated: >60 mL/min (ref >60)
Glucose, Bld: 135 mg/dL — ABNORMAL HIGH (ref 70–99)
Potassium: 3.4 mmol/L — ABNORMAL LOW (ref 3.5–5.1)
Sodium: 141 mmol/L (ref 135–145)
Total Bilirubin: 0.4 mg/dL (ref 0.3–1.2)
Total Protein: 5.8 g/dL — ABNORMAL LOW (ref 6.5–8.1)

## 2022-09-11 LAB — MAGNESIUM: Magnesium: 1.9 mg/dL (ref 1.7–2.4)

## 2022-09-11 LAB — PHOSPHORUS: Phosphorus: 4.5 mg/dL (ref 2.5–4.6)

## 2022-09-11 MED ORDER — PANTOPRAZOLE SODIUM 40 MG PO TBEC
40.0000 mg | DELAYED_RELEASE_TABLET | Freq: Every day | ORAL | Status: DC
  Filled 2022-09-11: qty 1

## 2022-09-11 MED ORDER — ONDANSETRON HCL 4 MG/2ML IJ SOLN: 4.0000 mg | Freq: Four times a day (QID) | INTRAMUSCULAR | Status: DC | PRN

## 2022-09-11 MED ORDER — DIAZEPAM 5 MG PO TABS: 5.0000 mg | ORAL_TABLET | Freq: Three times a day (TID) | ORAL | Status: DC | PRN

## 2022-09-11 MED ORDER — ROFLUMILAST 500 MCG PO TABS
500.0000 ug | ORAL_TABLET | Freq: Every day | ORAL | Status: DC
  Administered 2022-09-12 – 2022-09-13 (×2): 500 ug via ORAL
  Filled 2022-09-11 (×2): qty 1

## 2022-09-11 MED ORDER — LEVOTHYROXINE SODIUM 25 MCG PO TABS
25.0000 ug | ORAL_TABLET | Freq: Every day | ORAL | Status: DC
  Administered 2022-09-12: 25 ug via ORAL
  Filled 2022-09-11 (×2): qty 1

## 2022-09-11 MED ORDER — POTASSIUM CHLORIDE CRYS ER 20 MEQ PO TBCR
40.0000 meq | EXTENDED_RELEASE_TABLET | Freq: Once | ORAL | Status: AC
  Administered 2022-09-11: 40 meq via ORAL
  Filled 2022-09-11: qty 2

## 2022-09-11 MED ORDER — INSULIN ASPART 100 UNIT/ML IJ SOLN
0.0000 [IU] | Freq: Three times a day (TID) | INTRAMUSCULAR | Status: DC
  Administered 2022-09-12 – 2022-09-13 (×4): 2 [IU] via SUBCUTANEOUS

## 2022-09-11 MED ORDER — IBUPROFEN 600 MG PO TABS
600.0000 mg | ORAL_TABLET | Freq: Three times a day (TID) | ORAL | Status: AC
  Administered 2022-09-11 – 2022-09-12 (×6): 600 mg via ORAL
  Filled 2022-09-11 (×6): qty 1

## 2022-09-11 MED ORDER — PANTOPRAZOLE SODIUM 40 MG PO TBEC
40.0000 mg | DELAYED_RELEASE_TABLET | Freq: Two times a day (BID) | ORAL | Status: DC
  Administered 2022-09-11 – 2022-09-13 (×5): 40 mg via ORAL
  Filled 2022-09-11 (×5): qty 1

## 2022-09-11 MED ORDER — CYCLOBENZAPRINE HCL 10 MG PO TABS
10.0000 mg | ORAL_TABLET | Freq: Every day | ORAL | Status: DC
  Administered 2022-09-11 – 2022-09-12 (×2): 10 mg via ORAL
  Filled 2022-09-11 (×2): qty 1

## 2022-09-11 MED ORDER — BUSPIRONE HCL 5 MG PO TABS
5.0000 mg | ORAL_TABLET | Freq: Every day | ORAL | Status: DC
  Administered 2022-09-11 – 2022-09-12 (×2): 5 mg via ORAL
  Filled 2022-09-11 (×2): qty 1

## 2022-09-11 MED ORDER — PREGABALIN 75 MG PO CAPS
300.0000 mg | ORAL_CAPSULE | Freq: Two times a day (BID) | ORAL | Status: DC
  Administered 2022-09-11 – 2022-09-13 (×4): 300 mg via ORAL
  Filled 2022-09-11 (×4): qty 4

## 2022-09-11 MED ORDER — VANCOMYCIN HCL 1500 MG/300ML IV SOLN
1500.0000 mg | Freq: Once | INTRAVENOUS | Status: AC
  Administered 2022-09-11: 1500 mg via INTRAVENOUS
  Filled 2022-09-11: qty 300

## 2022-09-11 MED ORDER — ACETAMINOPHEN 325 MG PO TABS: 650.0000 mg | ORAL_TABLET | Freq: Four times a day (QID) | ORAL | Status: DC | PRN

## 2022-09-11 MED ORDER — VANCOMYCIN HCL 1250 MG/250ML IV SOLN
1250.0000 mg | INTRAVENOUS | Status: DC
  Administered 2022-09-12 – 2022-09-13 (×2): 1250 mg via INTRAVENOUS
  Filled 2022-09-11 (×2): qty 250

## 2022-09-11 MED ORDER — ALBUTEROL SULFATE (2.5 MG/3ML) 0.083% IN NEBU
2.5000 mg | INHALATION_SOLUTION | Freq: Four times a day (QID) | RESPIRATORY_TRACT | Status: DC | PRN
  Administered 2022-09-11: 2.5 mg via RESPIRATORY_TRACT
  Filled 2022-09-11: qty 3

## 2022-09-11 MED ORDER — TRAZODONE HCL 50 MG PO TABS
100.0000 mg | ORAL_TABLET | Freq: Every day | ORAL | Status: DC
  Administered 2022-09-11 – 2022-09-12 (×2): 100 mg via ORAL
  Filled 2022-09-11 (×2): qty 2

## 2022-09-11 MED ORDER — BUPROPION HCL ER (XL) 300 MG PO TB24
300.0000 mg | ORAL_TABLET | Freq: Every day | ORAL | Status: DC
  Administered 2022-09-11 – 2022-09-13 (×3): 300 mg via ORAL
  Filled 2022-09-11 (×3): qty 1

## 2022-09-11 MED ORDER — ACETAMINOPHEN 650 MG RE SUPP: 650.0000 mg | Freq: Four times a day (QID) | RECTAL | Status: DC | PRN

## 2022-09-11 MED ORDER — MONTELUKAST SODIUM 10 MG PO TABS
10.0000 mg | ORAL_TABLET | Freq: Every day | ORAL | Status: DC
  Administered 2022-09-11 – 2022-09-12 (×2): 10 mg via ORAL
  Filled 2022-09-11 (×2): qty 1

## 2022-09-11 MED ORDER — POTASSIUM CHLORIDE 20 MEQ PO PACK
40.0000 meq | PACK | Freq: Once | ORAL | Status: AC
  Administered 2022-09-11: 40 meq via ORAL
  Filled 2022-09-11: qty 2

## 2022-09-11 MED ORDER — HYDROCHLOROTHIAZIDE 25 MG PO TABS
25.0000 mg | ORAL_TABLET | Freq: Every day | ORAL | Status: DC
  Administered 2022-09-11 – 2022-09-13 (×3): 25 mg via ORAL
  Filled 2022-09-11 (×3): qty 1

## 2022-09-11 MED ORDER — POLYETHYLENE GLYCOL 3350 17 G PO PACK: 17.0000 g | PACK | Freq: Every day | ORAL | Status: DC | PRN

## 2022-09-11 MED ORDER — VENLAFAXINE HCL ER 75 MG PO CP24
150.0000 mg | ORAL_CAPSULE | Freq: Every day | ORAL | Status: DC
  Administered 2022-09-12 – 2022-09-13 (×2): 150 mg via ORAL
  Filled 2022-09-11 (×2): qty 2

## 2022-09-11 MED ORDER — ENOXAPARIN SODIUM 40 MG/0.4ML IJ SOSY
40.0000 mg | PREFILLED_SYRINGE | Freq: Every day | INTRAMUSCULAR | Status: DC
  Administered 2022-09-11: 40 mg via SUBCUTANEOUS
  Filled 2022-09-11 (×2): qty 0.4

## 2022-09-11 MED ORDER — ALBUTEROL SULFATE (2.5 MG/3ML) 0.083% IN NEBU
2.5000 mg | INHALATION_SOLUTION | Freq: Two times a day (BID) | RESPIRATORY_TRACT | Status: DC
  Administered 2022-09-11 – 2022-09-13 (×4): 2.5 mg via RESPIRATORY_TRACT
  Filled 2022-09-11 (×4): qty 3

## 2022-09-11 MED ORDER — ATORVASTATIN CALCIUM 20 MG PO TABS
20.0000 mg | ORAL_TABLET | Freq: Every day | ORAL | Status: DC
  Administered 2022-09-11 – 2022-09-12 (×2): 20 mg via ORAL
  Filled 2022-09-11 (×2): qty 1

## 2022-09-11 MED ORDER — ATENOLOL 25 MG PO TABS
25.0000 mg | ORAL_TABLET | Freq: Every day | ORAL | Status: DC
  Administered 2022-09-11 – 2022-09-13 (×3): 25 mg via ORAL
  Filled 2022-09-11 (×3): qty 1

## 2022-09-11 MED ORDER — MELATONIN 3 MG PO TABS: 3.0000 mg | ORAL_TABLET | Freq: Every evening | ORAL | Status: DC | PRN

## 2022-09-11 MED ORDER — ONDANSETRON HCL 4 MG PO TABS: 4.0000 mg | ORAL_TABLET | Freq: Four times a day (QID) | ORAL | Status: DC | PRN

## 2022-09-11 MED ORDER — QUETIAPINE FUMARATE 100 MG PO TABS
100.0000 mg | ORAL_TABLET | Freq: Every day | ORAL | Status: DC
  Administered 2022-09-11 – 2022-09-12 (×2): 100 mg via ORAL
  Filled 2022-09-11 (×2): qty 1

## 2022-09-11 NOTE — Plan of Care (Signed)
  Problem: Education: Goal: Knowledge of General Education information will improve Description: Including pain rating scale, medication(s)/side effects and non-pharmacologic comfort measures Outcome: Progressing   Problem: Health Behavior/Discharge Planning: Goal: Ability to manage health-related needs will improve Outcome: Not Progressing   Problem: Clinical Measurements: Goal: Ability to maintain clinical measurements within normal limits will improve Outcome: Not Progressing   Problem: Activity: Goal: Risk for activity intolerance will decrease Outcome: Not Progressing

## 2022-09-11 NOTE — Progress Notes (Signed)
   09/11/22 1011  TOC Brief Assessment  Insurance and Status Reviewed  Patient has primary care physician Yes  Home environment has been reviewed Lives alone.  Prior level of function: Independent.  Prior/Current Home Services Current home services [facility])  Social Determinants of Health Reivew SDOH reviewed no interventions necessary  Readmission risk has been reviewed Yes  Transition of care needs no transition of care needs at this time   Pt lives alone and indicates she manages okay. She is active with [facility] at home. No needs reported at this time. Pt plans to return home with [facility]. TOC will follow.

## 2022-09-11 NOTE — Progress Notes (Signed)
Patient seen and examined; admitted after midnight secondary to right lower extremity pain/redness and swelling. Patient with recent admission due to RLE cellulitis that failed outpatient oral antibiotics. No fever, no CP, no nausea, no vomiting. Please refer to H&P written by Dr. Thomes Dinning on 09/11/22 for further info/details on admission.  Plan: -continue to keep leg elevated as much as possb;e -continue as needed antibiotics -continue current IV antibiotics and follow clinical response.  Vassie Loll MD [phone]

## 2022-09-11 NOTE — Plan of Care (Signed)
  Problem: Education: Goal: Knowledge of General Education information will improve Description: Including pain rating scale, medication(s)/side effects and non-pharmacologic comfort measures Outcome: Not Met (add Reason)   Problem: Health Behavior/Discharge Planning: Goal: Ability to manage health-related needs will improve Outcome: Not Met (add Reason)   Problem: Clinical Measurements: Goal: Ability to maintain clinical measurements within normal limits will improve Outcome: Progressing   Problem: Clinical Measurements: Goal: Respiratory complications will improve Outcome: Progressing   Problem: Activity: Goal: Risk for activity intolerance will decrease Outcome: Not Met (add Reason)

## 2022-09-11 NOTE — H&P (Signed)
History and Physical    Patient: Joan [name] XLK:[id number] DOB: [birth year] DOA: 09/10/2022 DOS: the patient was seen and examined on 09/11/2022 PCP: Myrlene Broker, MD  Patient coming from: Home  Chief Complaint:  Chief Complaint  Patient presents with   Shortness of Breath   HPI: Joan [name] is a 62 y.o. female with medical history significant of terminal COPD on hospice care, hypertension, hyperlipidemia, T2DM, OSA on CPAP who presented to the emergency department due to worsening redness of her foot and shortness of breath. Patient was admitted from 7/14 to 7/15 due to right lower extremity cellulitis with failed outpatient treatment, she requested to be discharged home, but on getting home, she endorsed worsening shortness of breath and worsening pain and redness of her right foot, so she decided to return to the ED for further evaluation and management. She denies chest pain, fever, chills, nausea, vomiting, abdominal pain.  ED Course:  In the emergency department, she was tachypneic with a respiratory rate of 28/min, O2 sats was 98% on supplemental oxygen via [place] at 4 LPM, other vital signs were within normal range.  Workup in the ED showed normal CBC and BMP except for potassium of 3.4, glucose 102, creatinine 1.01.  Blood culture pending. Chest x-ray showed emphysematous and chronic bronchitic changes in the lungs.  No evidence of active pulmonary disease. Hospitalist was asked to admit patient for further evaluation and management.  Review of Systems: Review of systems as noted in the HPI. All other systems reviewed and are negative.   Past Medical History:  Diagnosis Date   Acute respiratory failure with hypoxia (HCC)    Alcoholism and drug addiction in family    Anxiety    Appendicitis    Arthritis    "knees, hips, lower back" (09/25/2016)   Asthma    Bipolar disorder (HCC)    Chronic bronchitis (HCC)    "all the time" (09/25/2016)   Chronic leg pain     RLE   Chronic lower back pain    COPD (chronic obstructive pulmonary disease) (HCC)    Depression    Diabetes mellitus without complication (HCC)    Diabetic neuropathy (HCC)    Early cataracts, bilateral    Emphysema of lung (HCC)    Generalized anxiety disorder 02/09/2020   GERD (gastroesophageal reflux disease)    Headache    Hepatitis C    "treated back in 2001-2002"   High cholesterol    Hypertension    Hypothyroidism    Incarcerated ventral hernia 04/04/2017   Mood disorder in conditions classified elsewhere 02/09/2020   On home oxygen therapy    "2L; at nighttime" (04/04/2017)   OSA on CPAP    CPAP with Oxygen 3 liters   Paranoid (HCC)    Pneumonia    "all the time" (09/25/2016)   Positive PPD    with negative chest x ray   PTSD (post-traumatic stress disorder) 02/09/2020   Tachycardia    patient reports with exertion ; denies any other conditions    Thyroid disease    Wears glasses    Wears partial dentures    Past Surgical History:  Procedure Laterality Date   ANTERIOR CERVICAL DECOMP/DISCECTOMY FUSION N/A 02/14/2022   Procedure: Anterior Cervical Decompression/Discectomy Fusion Cervical Three-Cervical Four;  Surgeon: Bedelia Person, MD;  [facility] OR;  Service: Neurosurgery;  Laterality: N/A;  3C   APPENDECTOMY     BACK SURGERY     BIOPSY  11/24/2018  Procedure: BIOPSY;  Surgeon: Tressia Danas, MD;  Location: [facility];  Service: Gastroenterology;;   CARDIAC CATHETERIZATION     COLONOSCOPY     COLONOSCOPY WITH PROPOFOL N/A 11/24/2018   Procedure: COLONOSCOPY WITH PROPOFOL;  Surgeon: Tressia Danas, MD;  Location: [facility];  Service: Gastroenterology;  Laterality: N/A;   CYST EXCISION     removal of cyst in sinuses   DILATION AND CURETTAGE OF UTERUS     ESOPHAGOGASTRODUODENOSCOPY (EGD) WITH PROPOFOL N/A 11/24/2018   Procedure: ESOPHAGOGASTRODUODENOSCOPY (EGD) WITH PROPOFOL;  Surgeon: Tressia Danas, MD;  Location: [facility];   Service: Gastroenterology;  Laterality: N/A;   FOOT SURGERY Bilateral    bone removed from 4th and 5 th toes and put in pin then took pin out   HERNIA REPAIR     INCISION AND DRAINAGE  ~ 2014/2015   "removed mesh & infection"   INCISIONAL HERNIA REPAIR N/A 04/04/2017   Procedure: LAPAROSCOPIC INCISIONAL HERNIA REPAIR WITH MESH;  Surgeon: Claud Kelp, MD;  Location: [facility] OR;  Service: General;  Laterality: N/A;   LACERATION REPAIR Right    repair wrist artery from laceration from ice skate   LAPAROSCOPIC APPENDECTOMY N/A 03/31/2019   Procedure: APPENDECTOMY LAPAROSCOPIC;  Surgeon: Axel Filler, MD;  Location: [facility] ORS;  Service: General;  Laterality: N/A;   LAPAROSCOPIC APPENDECTOMY N/A 07/09/2019   Procedure: APPENDECTOMY LAPAROSCOPIC;  Surgeon: Axel Filler, MD;  Location: [facility] OR;  Service: General;  Laterality: N/A;   LAPAROSCOPIC INCISIONAL / UMBILICAL / VENTRAL HERNIA REPAIR  04/04/2017   LAPAROSCOPIC INCISIONAL HERNIA REPAIR WITH MESH   LIPOMA EXCISION Right    fattty lipoma in neck   NASAL SEPTUM SURGERY     POSTERIOR LUMBAR FUSION  2015/2016 X 2   "added rods and screws"   SINOSCOPY     TOTAL HIP ARTHROPLASTY Right 02/05/2017   Procedure: RIGHT TOTAL HIP ARTHROPLASTY;  Surgeon: Ranee Gosselin, MD;  Location: [facility];  Service: Orthopedics;  Laterality: Right;   TOTAL HIP ARTHROPLASTY Left 06/18/2017   Procedure: LEFT TOTAL HIP ARTHROPLASTY;  Surgeon: Ranee Gosselin, MD;  Location: [facility];  Service: Orthopedics;  Laterality: Left;   TOTAL HIP REVISION Left 01/26/2019   Procedure: TOTAL HIP REVISION;  Surgeon: Durene Romans, MD;  Location: [facility];  Service: Orthopedics;  Laterality: Left;  2 hrs   TUBAL LIGATION     UMBILICAL HERNIA REPAIR  ~ 2013   w/mesh   [facility] ENDOSCOPY      Social History:  reports that she has been smoking cigarettes. She started smoking about 47 years ago. She has a 95.1 pack-year smoking history. She has been exposed to tobacco smoke. She has  never used smokeless tobacco. She reports that she does not currently use alcohol. She reports that she does not currently use drugs after having used the following drugs: "Crack" cocaine.   Allergies  Allergen Reactions   Keflex [Cephalexin] Rash and Other (See Comments)    Has tolerated amoxicillin since had keflex   Shellfish Allergy Shortness Of Breath, Nausea And Vomiting and Other (See Comments)    Stomach cramps   Tetracyclines & Related Anaphylaxis, Swelling and Other (See Comments)    Throat swelling requiring hospitalization   Dilaudid [Hydromorphone Hcl] Other (See Comments)    "Lethargy"   Prednisone Other (See Comments)    "counter reacts", has tolerated IV medrol   Clindamycin/Lincomycin Other (See Comments)    Burning sensation in her mouth down her throat    Incruse Ellipta [Umeclidinium Bromide]  Nausea Only   Molds & Smuts Itching   Tuberculin Tests Other (See Comments)    False postive   Umeclidinium     Other Reaction(s): GI Intolerance    Family History  Problem Relation Age of Onset   Diabetes Mother    Hypertension Mother    Hypertension Father    Cancer Father        lung   Breast cancer Neg Hx      Prior to Admission medications   Medication Sig Start Date End Date Taking? Authorizing Provider  Accu-Chek Softclix Lancets lancets Use as instructed 05/21/21   Myrlene Broker, MD  acyclovir (ZOVIRAX) 400 MG tablet TAKE 1 TABLET BY MOUTH TWICE  DAILY 08/14/22   Myrlene Broker, MD  albuterol (PROVENTIL) (2.5 MG/3ML) 0.083% nebulizer solution Take 3 mLs (2.5 mg total) by nebulization 2 (two) times daily. And every 6 hours as needed.  DX: J44.9 01/05/21   Bevelyn Ngo, NP  atenolol (TENORMIN) 25 MG tablet TAKE 1 TABLET BY MOUTH  DAILY 01/28/22   Myrlene Broker, MD  atorvastatin (LIPITOR) 20 MG tablet TAKE 1 TABLET BY MOUTH ONCE  DAILY 07/23/22   Myrlene Broker, MD  Blood Glucose Monitoring Suppl DEVI 1 each by Does not apply route in  the morning, at noon, and at bedtime. May substitute to any manufacturer covered by patient's insurance. 08/28/22   Myrlene Broker, MD  Budeson-Glycopyrrol-Formoterol (BREZTRI AEROSPHERE) 160-9-4.8 MCG/ACT AERO Inhale 2 puffs into the lungs in the morning and at bedtime. 05/08/21   Oretha Milch, MD  buPROPion (WELLBUTRIN XL) 300 MG 24 hr tablet TAKE 1 TABLET BY MOUTH DAILY 06/04/22   Thresa Ross, MD  busPIRone (BUSPAR) 5 MG tablet Take 1 tablet (5 mg total) by mouth at bedtime. 06/17/22   Thresa Ross, MD  cyclobenzaprine (FLEXERIL) 10 MG tablet TAKE 1 TABLET BY MOUTH AT  BEDTIME 07/31/22   Myrlene Broker, MD  diazepam (VALIUM) 5 MG tablet Take 1 tablet (5 mg total) by mouth every 8 (eight) hours as needed for anxiety. 02/15/22 02/15/23  Bedelia Person, MD  diphenhydrAMINE (BENADRYL) 25 mg capsule Take 25 mg by mouth 2 (two) times daily.    [provider]  fluticasone (FLONASE) 50 MCG/ACT nasal spray Place 2 sprays into both nostrils daily. Patient taking differently: Place 2 sprays into both nostrils daily as needed for allergies. 09/05/21 02/07/22  Alfonse Spruce, MD  Glucose Blood (BLOOD GLUCOSE TEST STRIPS) STRP 1 each by In Vitro route in the morning, at noon, and at bedtime. May substitute to any manufacturer covered by patient's insurance. 08/28/22 09/27/22  Myrlene Broker, MD  guaiFENesin-Codeine 225-7.5 MG/5ML LIQD Take 5 mLs by mouth 3 (three) times daily as needed. 07/30/22   Oretha Milch, MD  hydrochlorothiazide (HYDRODIURIL) 25 MG tablet TAKE 1 TABLET BY MOUTH DAILY 07/23/22   Myrlene Broker, MD  Ipratropium-Albuterol (COMBIVENT RESPIMAT) 20-100 MCG/ACT AERS respimat Inhale 1 puff into the lungs every 6 (six) hours as needed for wheezing or shortness of breath. 04/02/22   Bevelyn Ngo, NP  Lancet Device MISC 1 each by Does not apply route in the morning, at noon, and at bedtime. May substitute to any manufacturer covered by patient's insurance.  08/13/22 09/12/22  Myrlene Broker, MD  Lancets Misc. MISC 1 each by Does not apply route in the morning, at noon, and at bedtime. May substitute to any manufacturer covered by patient's insurance. 08/28/22 09/27/22  Myrlene Broker, MD  levofloxacin (LEVAQUIN) 500 MG tablet Take 1 tablet (500 mg total) by mouth daily for 5 days. 09/09/22 09/14/22  Sherryll Burger, Pratik D, DO  levothyroxine (SYNTHROID) 25 MCG tablet TAKE 1 TABLET BY MOUTH DAILY  BEFORE BREAKFAST 07/23/22   Myrlene Broker, MD  MELATONIN PO Take 1 tablet by mouth at bedtime as needed (sleep).    [provider]  montelukast (SINGULAIR) 10 MG tablet TAKE 1 TABLET BY MOUTH AT  BEDTIME 07/23/22   Oretha Milch, MD  naproxen (NAPROSYN) 500 MG tablet Take 1 tablet (500 mg total) by mouth 2 (two) times daily with a meal. 03/29/22   Myrlene Broker, MD  nystatin-triamcinolone ointment [facility]) Apply 1 application topically 2 (two) times daily as needed (applied to skin folds/groins/under breast skin irritation rash.). 09/08/17   Myrlene Broker, MD  OXYGEN Inhale 3 L/min into the lungs See admin instructions. Inhale  3 L/min of oxygen into the lungs w/CPAP at bedtime and as needed for shortness of breath with "strenuous activity" during the day    [provider]  pantoprazole (PROTONIX) 40 MG tablet TAKE 1 TABLET BY MOUTH DAILY 12/21/21   Myrlene Broker, MD  PATADAY 0.2 % SOLN Place 1 drop into both eyes daily. 08/20/19   [provider]  polyethylene glycol (MIRALAX / GLYCOLAX) 17 g packet Take 17 g by mouth daily as needed for mild constipation. 04/14/19   Meuth, Brooke A, PA-C  pregabalin (LYRICA) 300 MG capsule Take 1 capsule (300 mg total) by mouth 2 (two) times daily. 03/29/22   Myrlene Broker, MD  QUEtiapine (SEROQUEL) 100 MG tablet TAKE 1 TABLET BY MOUTH AT  BEDTIME 07/31/22   Thresa Ross, MD  roflumilast (DALIRESP) 500 MCG TABS tablet Take 1 tablet (500 mcg total) by mouth daily. 04/02/22    Bevelyn Ngo, NP  traZODone (DESYREL) 100 MG tablet TAKE 1 TABLET BY MOUTH AT  BEDTIME 12/21/21   Myrlene Broker, MD  triamcinolone cream (KENALOG) 0.1 % Apply 1 Application topically 2 (two) times daily. 08/20/22   Myrlene Broker, MD  Varenicline Tartrate, Starter, (CHANTIX STARTING MONTH PAK) 0.5 MG X 11 & 1 MG X 42 TBPK Chantix started pack Take 0.5 mg once daily x 3 days, then take 0.5 mg twice daily x 4 days, then take 1 mg twice daily thereafter. Patient not taking: Reported on 09/08/2022 04/12/22   Bevelyn Ngo, NP  venlafaxine XR (EFFEXOR-XR) 150 MG 24 hr capsule TAKE 1 CAPSULE BY MOUTH DAILY  WITH BREAKFAST 12/21/21   Myrlene Broker, MD  witch hazel-glycerin (TUCKS) pad Apply topically as needed for itching. 08/18/20   Standley Brooking, MD    Physical Exam: BP 123/69   Pulse 79   Temp 97.9 F (36.6 C) (Oral)   Resp (!) 24   Ht 5\' 8"  (1.727 m)   Wt 90.2 kg   SpO2 99%   BMI 30.24 kg/m   General: 62 y.o. year-old female well developed well nourished in no acute distress.  Alert and oriented x3. HEENT: NCAT, EOMI Neck: Supple, trachea medial Cardiovascular: Regular rate and rhythm with no rubs or gallops.  No thyromegaly or JVD noted.  No lower extremity edema. 2/4 pulses in all 4 extremities. Respiratory: Clear to auscultation with no wheezes or rales. Good inspiratory effort. Abdomen: Soft, nontender nondistended with normal bowel sounds x4 quadrants. Muskuloskeletal: No cyanosis, clubbing or edema noted bilaterally Neuro: CN II-XII intact, strength  5/5 x 4, sensation, reflexes intact Skin: Right lower extremity presents with erythema and warmth.  Blister on dorsal aspect of right great toe was noted.  Psychiatry: Mood is appropriate for condition and setting          Labs on Admission:  Basic Metabolic Panel: Recent Labs  Lab 09/08/22 0325 09/08/22 0842 09/09/22 0534 09/10/22 2105  NA 135  --  136 141  K 2.6*  --  3.3* 3.4*  CL 94*  --  99  103  CO2 30  --  29 30  GLUCOSE 124*  --  129* 102*  BUN 11  --  14 14  CREATININE 0.94  --  0.79 1.01*  CALCIUM 8.6*  --  8.3* 8.8*  MG  --  1.8 1.8  --    Liver Function Tests: Recent Labs  Lab 09/08/22 0325  AST 23  ALT 19  ALKPHOS 90  BILITOT 0.5  PROT 6.1*  ALBUMIN 3.4*   No results for input(s): "LIPASE", "AMYLASE" in the last 168 hours. No results for input(s): "AMMONIA" in the last 168 hours. CBC: Recent Labs  Lab 09/08/22 0325 09/09/22 0534 09/10/22 2105  WBC 8.8 7.0 8.3  HGB 11.9* 11.4* 12.0  HCT 36.0 36.8 38.0  MCV 87.6 91.1 91.1  PLT 218 186 262   Cardiac Enzymes: No results for input(s): "CKTOTAL", "CKMB", "CKMBINDEX", "TROPONINI" in the last 168 hours.  BNP (last 3 results) No results for input(s): "BNP" in the last 8760 hours.  ProBNP (last 3 results) No results for input(s): "PROBNP" in the last 8760 hours.  CBG: Recent Labs  Lab 09/08/22 1530 09/08/22 2129 09/09/22 0731 09/09/22 1118 09/09/22 1640  GLUCAP 138* 123* 144* 103* 123*    Radiological Exams on Admission: DG Chest 2 View  Result Date: 09/10/2022 CLINICAL DATA:  COPD. Shortness of breath and bilateral lower edema. EXAM: CHEST - 2 VIEW COMPARISON:  07/31/2022 FINDINGS: Emphysematous changes in the lungs. Heart size and pulmonary vascularity are normal. Peribronchial thickening suggesting chronic bronchitis. No airspace disease or consolidation. Prominent left cardiac fat pad. No pleural effusions. No pneumothorax. Mediastinal contours appear intact. Calcification of the aorta. IMPRESSION: Emphysematous and chronic bronchitic changes in the lungs. No evidence of active pulmonary disease. Electronically Signed   By: Burman Nieves M.D.   On: 09/10/2022 22:01    EKG: I independently viewed the EKG done and my findings are as followed: Normal sinus rhythm at a rate of 84 bpm  Assessment/Plan Present on Admission:  Cellulitis of right lower extremity  Hypokalemia  Controlled type 2  diabetes mellitus with neuropathy (HCC)  Essential hypertension  Mixed hyperlipidemia  Acquired hypothyroidism  GERD (gastroesophageal reflux disease)  Principal Problem:   Cellulitis of right lower extremity Active Problems:   Hypokalemia   Essential hypertension   Mixed hyperlipidemia   GERD (gastroesophageal reflux disease)   OSA on CPAP   Acquired hypothyroidism   Controlled type 2 diabetes mellitus with neuropathy (HCC)   Obesity (BMI 30-39.9)  Cellulitis of right lower extremity ABI done was normal Continue IV vancomycin  Hypokalemia K+ 3.4, this will be replenished  COPD with chronic hypoxemia Patient has a terminal COPD and is currently under hospice care, but wants to remain full code Consult to palliative care Wears 3 L nasal cannula at baseline   OSA on CPAP Continue CPAP at bedtime   Type 2 diabetes with neuropathy Hemoglobin A1c on 09/08/2022 was 6.8 Continue ISS and hypoglycemia protocol Continue carb modified diet  Essential hypertension Continue atenolol Continue IV hydralazine 10 mg every 6 hours as needed for SBP > 180   Mixed hyperlipidemia Continue Lipitor   Anxiety/depression Continue home medications   Acquired hypothyroidism Continue Synthroid   GERD Continue Protonix   Obesity (BMI 30.24) Lifestyle modification  DVT prophylaxis: Lovenox  Advance Care Planning: Full code  Consults: Palliative care  Family Communication: None at bedside  Severity of Illness: The appropriate patient status for this patient is INPATIENT. Inpatient status is judged to be reasonable and necessary in order to provide the required intensity of service to ensure the patient's safety. The patient's presenting symptoms, physical exam findings, and initial radiographic and laboratory data in the context of their chronic comorbidities is felt to place them at high risk for further clinical deterioration. Furthermore, it is not anticipated that the patient  will be medically stable for discharge from the hospital within 2 midnights of admission.   * I certify that at the point of admission it is my clinical judgment that the patient will require inpatient hospital care spanning beyond 2 midnights from the point of admission due to high intensity of service, high risk for further deterioration and high frequency of surveillance required.*  Author: Frankey Shown, DO 09/11/2022 7:01 AM  For on call review [email or web address].

## 2022-09-11 NOTE — Progress Notes (Signed)
Palliative: Attempt to see Joan [name] but she was sleeping soundly and did not wake.   Joan [name] was seen by palliative team 7/15 with goals set to return to home with Joan [name] already in place.  PMT to follow tomorrow.   Conference with transition of care team related to patient condition, needs.   No charge Joan Carmel, NP Palliative Medicine Team  Team Phone [phone] Greater than 50% of this time was spent counseling and coordinating care related to the above assessment and plan.

## 2022-09-11 NOTE — Progress Notes (Signed)
Pharmacy Antibiotic Note  Joan [name] is a 62 y.o. female admitted on 09/10/2022 with cellulitis.  Pharmacy has been consulted for Vancomycin dosing. Recently discharged with the same. WBC WNL. Scr 1.01.   Plan: Vancomycin 1250 mg IV q24h >>>Estimated AUC: 513 Trend WBC, temp, renal function  F/U infectious work-up Drug levels as indicated   Height: 5\' 8"  (172.7 cm) Weight: 90.2 kg (198 lb 13.7 oz) IBW/kg (Calculated) : 63.9  Temp (24hrs), Avg:98.1 F (36.7 C), Min:97.9 F (36.6 C), Max:98.3 F (36.8 C)  Recent Labs  Lab 09/08/22 0325 09/09/22 0534 09/10/22 2105  WBC 8.8 7.0 8.3  CREATININE 0.94 0.79 1.01*    Estimated Creatinine Clearance: 69.6 mL/min (A) (by C-G formula based on SCr of 1.01 mg/dL (H)).    Allergies  Allergen Reactions   Keflex [Cephalexin] Rash and Other (See Comments)    Has tolerated amoxicillin since had keflex   Shellfish Allergy Shortness Of Breath, Nausea And Vomiting and Other (See Comments)    Stomach cramps   Tetracyclines & Related Anaphylaxis, Swelling and Other (See Comments)    Throat swelling requiring hospitalization   Dilaudid [Hydromorphone Hcl] Other (See Comments)    "Lethargy"   Prednisone Other (See Comments)    "counter reacts", has tolerated IV medrol   Clindamycin/Lincomycin Other (See Comments)    Burning sensation in her mouth down her throat    Incruse Ellipta [Umeclidinium Bromide] Nausea Only   Molds & Smuts Itching   Tuberculin Tests Other (See Comments)    False postive   Umeclidinium     Other Reaction(s): GI Intolerance    Abran Duke, PharmD, BCPS Clinical Pharmacist Phone: [phone]

## 2022-09-11 NOTE — Progress Notes (Signed)
CPAP setting decreased from 10 to 6 per patient request

## 2022-09-11 NOTE — Progress Notes (Signed)
Patient alert and oriented but very difficult to arouse when sleeping. Hoarse and short of breath with exertion on 4L oxygen and breathing treatment.

## 2022-09-11 NOTE — ED Notes (Signed)
ED TO INPATIENT HANDOFF REPORT  ED Nurse Name and Phone #:   S Name/Age/Gender Joan [name] 62 y.o. female Room/Bed: APA02/APA02  Code Status   Code Status: Prior  Home/SNF/Other Home Patient oriented to: self, place, time, and situation Is this baseline? Yes   Triage Complete: Triage complete  Chief Complaint Cellulitis of right lower extremity [L03.115]  Triage Note Pt bib [facility] from home c/o SOB and bilateral lower edema. Per EMS Pt was d/c from this facility yesterday with cellulitis of right foot.  Pt on 3L [place] at home with sats in mid 80s on EMS arrival.    Pt 98% on 3L [place] in triage. Pt c/o of pain in right foot.    Allergies Allergies  Allergen Reactions   Keflex [Cephalexin] Rash and Other (See Comments)    Has tolerated amoxicillin since had keflex   Shellfish Allergy Shortness Of Breath, Nausea And Vomiting and Other (See Comments)    Stomach cramps   Tetracyclines & Related Anaphylaxis, Swelling and Other (See Comments)    Throat swelling requiring hospitalization   Dilaudid [Hydromorphone Hcl] Other (See Comments)    "Lethargy"   Prednisone Other (See Comments)    "counter reacts", has tolerated IV medrol   Clindamycin/Lincomycin Other (See Comments)    Burning sensation in her mouth down her throat    Incruse Ellipta [Umeclidinium Bromide] Nausea Only   Molds & Smuts Itching   Tuberculin Tests Other (See Comments)    False postive   Umeclidinium     Other Reaction(s): GI Intolerance    Level of Care/Admitting Diagnosis ED Disposition     ED Disposition  Admit   Condition  --   Comment  Hospital Area: [facility] [[id number]]  Level of Care: Med-Surg [16]  Covid Evaluation: Asymptomatic - no recent exposure (last 10 days) testing not required  Diagnosis: Cellulitis of right lower extremity [161096]  Admitting Physician: Frankey Shown [[id number]]  Attending Physician: Frankey Shown [[id number]]  Certification:: I certify this  patient will need inpatient services for at least 2 midnights  Estimated Length of Stay: 3          B Medical/Surgery History Past Medical History:  Diagnosis Date   Acute respiratory failure with hypoxia (HCC)    Alcoholism and drug addiction in family    Anxiety    Appendicitis    Arthritis    "knees, hips, lower back" (09/25/2016)   Asthma    Bipolar disorder (HCC)    Chronic bronchitis (HCC)    "all the time" (09/25/2016)   Chronic leg pain    RLE   Chronic lower back pain    COPD (chronic obstructive pulmonary disease) (HCC)    Depression    Diabetes mellitus without complication (HCC)    Diabetic neuropathy (HCC)    Early cataracts, bilateral    Emphysema of lung (HCC)    Generalized anxiety disorder 02/09/2020   GERD (gastroesophageal reflux disease)    Headache    Hepatitis C    "treated back in 2001-2002"   High cholesterol    Hypertension    Hypothyroidism    Incarcerated ventral hernia 04/04/2017   Mood disorder in conditions classified elsewhere 02/09/2020   On home oxygen therapy    "2L; at nighttime" (04/04/2017)   OSA on CPAP    CPAP with Oxygen 3 liters   Paranoid (HCC)    Pneumonia    "all the time" (09/25/2016)   Positive PPD    with negative  chest x ray   PTSD (post-traumatic stress disorder) 02/09/2020   Tachycardia    patient reports with exertion ; denies any other conditions    Thyroid disease    Wears glasses    Wears partial dentures    Past Surgical History:  Procedure Laterality Date   ANTERIOR CERVICAL DECOMP/DISCECTOMY FUSION N/A 02/14/2022   Procedure: Anterior Cervical Decompression/Discectomy Fusion Cervical Three-Cervical Four;  Surgeon: Bedelia Person, MD;  Location: [facility];  Service: Neurosurgery;  Laterality: N/A;  3C   APPENDECTOMY     BACK SURGERY     BIOPSY  11/24/2018   Procedure: BIOPSY;  Surgeon: Tressia Danas, MD;  Location: [facility];  Service: Gastroenterology;;   CARDIAC CATHETERIZATION     COLONOSCOPY      COLONOSCOPY WITH PROPOFOL N/A 11/24/2018   Procedure: COLONOSCOPY WITH PROPOFOL;  Surgeon: Tressia Danas, MD;  Location: [facility];  Service: Gastroenterology;  Laterality: N/A;   CYST EXCISION     removal of cyst in sinuses   DILATION AND CURETTAGE OF UTERUS     ESOPHAGOGASTRODUODENOSCOPY (EGD) WITH PROPOFOL N/A 11/24/2018   Procedure: ESOPHAGOGASTRODUODENOSCOPY (EGD) WITH PROPOFOL;  Surgeon: Tressia Danas, MD;  Location: [facility];  Service: Gastroenterology;  Laterality: N/A;   FOOT SURGERY Bilateral    bone removed from 4th and 5 th toes and put in pin then took pin out   HERNIA REPAIR     INCISION AND DRAINAGE  ~ 2014/2015   "removed mesh & infection"   INCISIONAL HERNIA REPAIR N/A 04/04/2017   Procedure: LAPAROSCOPIC INCISIONAL HERNIA REPAIR WITH MESH;  Surgeon: Claud Kelp, MD;  Location: [facility] OR;  Service: General;  Laterality: N/A;   LACERATION REPAIR Right    repair wrist artery from laceration from ice skate   LAPAROSCOPIC APPENDECTOMY N/A 03/31/2019   Procedure: APPENDECTOMY LAPAROSCOPIC;  Surgeon: Axel Filler, MD;  Location: [facility];  Service: General;  Laterality: N/A;   LAPAROSCOPIC APPENDECTOMY N/A 07/09/2019   Procedure: APPENDECTOMY LAPAROSCOPIC;  Surgeon: Axel Filler, MD;  Location: [facility] OR;  Service: General;  Laterality: N/A;   LAPAROSCOPIC INCISIONAL / UMBILICAL / VENTRAL HERNIA REPAIR  04/04/2017   LAPAROSCOPIC INCISIONAL HERNIA REPAIR WITH MESH   LIPOMA EXCISION Right    fattty lipoma in neck   NASAL SEPTUM SURGERY     POSTERIOR LUMBAR FUSION  2015/2016 X 2   "added rods and screws"   SINOSCOPY     TOTAL HIP ARTHROPLASTY Right 02/05/2017   Procedure: RIGHT TOTAL HIP ARTHROPLASTY;  Surgeon: Ranee Gosselin, MD;  Location: [facility];  Service: Orthopedics;  Laterality: Right;   TOTAL HIP ARTHROPLASTY Left 06/18/2017   Procedure: LEFT TOTAL HIP ARTHROPLASTY;  Surgeon: Ranee Gosselin, MD;  Location: [facility];  Service: Orthopedics;  Laterality:  Left;   TOTAL HIP REVISION Left 01/26/2019   Procedure: TOTAL HIP REVISION;  Surgeon: Durene Romans, MD;  Location: [facility];  Service: Orthopedics;  Laterality: Left;  2 hrs   TUBAL LIGATION     UMBILICAL HERNIA REPAIR  ~ 2013   w/mesh   UPPER GI ENDOSCOPY       A IV Location/Drains/Wounds Patient Lines/Drains/Airways Status     Active Line/Drains/Airways     Name Placement date Placement time Site Days   Pressure Injury 08/15/20 Deep Tissue Pressure Injury - Purple or maroon localized area of discolored intact skin or blood-filled blister due to damage of underlying soft tissue from pressure and/or shear. 08/15/20  --  -- 757  Intake/Output Last 24 hours No intake or output data in the 24 hours ending 09/11/22 0104  Labs/Imaging Results for orders placed or performed during the hospital encounter of 09/10/22 (from the past 48 hour(s))  Basic metabolic panel     Status: Abnormal   Collection Time: 09/10/22  9:05 PM  Result Value Ref Range   Sodium 141 135 - 145 mmol/L   Potassium 3.4 (L) 3.5 - 5.1 mmol/L   Chloride 103 98 - 111 mmol/L   CO2 30 22 - 32 mmol/L   Glucose, Bld 102 (H) 70 - 99 mg/dL    Comment: Glucose reference range applies only to samples taken after fasting for at least 8 hours.   BUN 14 6 - 20 mg/dL   Creatinine, Ser 3.08 (H) 0.44 - 1.00 mg/dL   Calcium 8.8 (L) 8.9 - 10.3 mg/dL   GFR, Estimated >65 >78 mL/min    Comment: (NOTE) Calculated using the CKD-EPI Creatinine Equation (2021)    Anion gap 8 5 - 15    Comment: Performed at [facility], [address] [place], [place] [address]  CBC     Status: Abnormal   Collection Time: 09/10/22  9:05 PM  Result Value Ref Range   WBC 8.3 4.0 - 10.5 K/uL   RBC 4.17 3.87 - 5.11 MIL/uL   Hemoglobin 12.0 12.0 - 15.0 g/dL   HCT 95.2 84.1 - 32.4 %   MCV 91.1 80.0 - 100.0 fL   MCH 28.8 26.0 - 34.0 pg   MCHC 31.6 30.0 - 36.0 g/dL   RDW 40.1 (H) 02.7 - 25.3 %   Platelets 262 150 - 400 K/uL   nRBC 0.0  0.0 - 0.2 %    Comment: Performed at [facility], [address] [place], [place] [address]   DG Chest 2 View  Result Date: 09/10/2022 CLINICAL DATA:  COPD. Shortness of breath and bilateral lower edema. EXAM: CHEST - 2 VIEW COMPARISON:  07/31/2022 FINDINGS: Emphysematous changes in the lungs. Heart size and pulmonary vascularity are normal. Peribronchial thickening suggesting chronic bronchitis. No airspace disease or consolidation. Prominent left cardiac fat pad. No pleural effusions. No pneumothorax. Mediastinal contours appear intact. Calcification of the aorta. IMPRESSION: Emphysematous and chronic bronchitic changes in the lungs. No evidence of active pulmonary disease. Electronically Signed   By: Burman Nieves M.D.   On: 09/10/2022 22:01    Pending Labs Unresulted Labs (From admission, onward)    None       Vitals/Pain Today's Vitals   09/10/22 2045 09/10/22 2110 09/10/22 2122 09/10/22 2215  BP:  (!) 114/103  127/77  Pulse:  82  78  Resp:  (!) 29  20  Temp:   98 F (36.7 C)   TempSrc:   Oral   SpO2:  98%  100%  PainSc: 5        Isolation Precautions No active isolations  Medications Medications - No data to display  Mobility walks     Focused Assessments    R Recommendations: See Admitting Provider Note  Report given to:   Additional Notes:

## 2022-09-11 NOTE — ED Notes (Signed)
Pt [place] off, upon entering room pt O2 sat 84% on RA, pt reports she did not realize O2 had come off, placed oxygen back on and instructed to take deep breaths thru her nose, O2 sat back up to 98% on 4L, hospitalist aware

## 2022-09-11 NOTE — Progress Notes (Signed)
Patient arrived to the floor while I was doing the assessment on another patient. I was told she wanted something for pain. I went into the room once I finished the other assessment and the patient was asleep. I had to encourage her to wake up to answer my admission assessment questions in which she could not stay awake long enough to complete entirely. I will attempt to  complete questions later in the shift.

## 2022-09-12 ENCOUNTER — Encounter (HOSPITAL_COMMUNITY): Payer: Self-pay | Admitting: Internal Medicine

## 2022-09-12 DIAGNOSIS — E1140 Type 2 diabetes mellitus with diabetic neuropathy, unspecified: Secondary | ICD-10-CM

## 2022-09-12 DIAGNOSIS — E669 Obesity, unspecified: Secondary | ICD-10-CM | POA: Diagnosis not present

## 2022-09-12 DIAGNOSIS — E782 Mixed hyperlipidemia: Secondary | ICD-10-CM | POA: Diagnosis not present

## 2022-09-12 DIAGNOSIS — L03115 Cellulitis of right lower limb: Secondary | ICD-10-CM

## 2022-09-12 DIAGNOSIS — G4733 Obstructive sleep apnea (adult) (pediatric): Secondary | ICD-10-CM

## 2022-09-12 DIAGNOSIS — I10 Essential (primary) hypertension: Secondary | ICD-10-CM

## 2022-09-12 DIAGNOSIS — Z7189 Other specified counseling: Secondary | ICD-10-CM

## 2022-09-12 DIAGNOSIS — K219 Gastro-esophageal reflux disease without esophagitis: Secondary | ICD-10-CM

## 2022-09-12 DIAGNOSIS — E876 Hypokalemia: Secondary | ICD-10-CM

## 2022-09-12 LAB — GLUCOSE, CAPILLARY
Glucose-Capillary: 130 mg/dL — ABNORMAL HIGH (ref 70–99)
Glucose-Capillary: 137 mg/dL — ABNORMAL HIGH (ref 70–99)
Glucose-Capillary: 124 mg/dL — ABNORMAL HIGH (ref 70–99)
Glucose-Capillary: 114 mg/dL — ABNORMAL HIGH (ref 70–99)

## 2022-09-12 LAB — BASIC METABOLIC PANEL
Anion gap: 8 (ref 5–15)
BUN: 9 mg/dL (ref 6–20)
CO2: 29 mmol/L (ref 22–32)
Calcium: 8.4 mg/dL — ABNORMAL LOW (ref 8.9–10.3)
Chloride: 101 mmol/L (ref 98–111)
Creatinine, Ser: 0.71 mg/dL (ref 0.44–1.00)
GFR, Estimated: >60 mL/min (ref >60)
Glucose, Bld: 153 mg/dL — ABNORMAL HIGH (ref 70–99)
Potassium: 3.3 mmol/L — ABNORMAL LOW (ref 3.5–5.1)
Sodium: 138 mmol/L (ref 135–145)

## 2022-09-12 LAB — MRSA NEXT GEN BY PCR, NASAL: MRSA by PCR Next Gen: NOT DETECTED

## 2022-09-12 LAB — CBC
HCT: 35.8 % — ABNORMAL LOW (ref 36.0–46.0)
Hemoglobin: 11.2 g/dL — ABNORMAL LOW (ref 12.0–15.0)
MCH: 28.9 pg (ref 26.0–34.0)
MCHC: 31.3 g/dL (ref 30.0–36.0)
MCV: 92.3 fL (ref 80.0–100.0)
Platelets: 243 10*3/uL (ref 150–400)
RBC: 3.88 MIL/uL (ref 3.87–5.11)
RDW: 16.9 % — ABNORMAL HIGH (ref 11.5–15.5)
WBC: 8.2 10*3/uL (ref 4.0–10.5)
nRBC: 0 % (ref 0.0–0.2)

## 2022-09-12 MED ORDER — MAGNESIUM SULFATE IN D5W 1-5 GM/100ML-% IV SOLN
1.0000 g | Freq: Once | INTRAVENOUS | Status: AC
  Administered 2022-09-12: 1 g via INTRAVENOUS

## 2022-09-12 MED ORDER — POTASSIUM CHLORIDE CRYS ER 20 MEQ PO TBCR
40.0000 meq | EXTENDED_RELEASE_TABLET | ORAL | Status: AC
  Administered 2022-09-12 (×2): 40 meq via ORAL
  Filled 2022-09-12 (×2): qty 2

## 2022-09-12 MED ORDER — SODIUM CHLORIDE 0.9 % IV SOLN: INTRAVENOUS | Status: DC | PRN

## 2022-09-12 NOTE — Consult Note (Signed)
Consultation Note Date: 09/12/2022   Patient Name: Joan [name]  DOB: 12-29-1960  MRN: [id number]  Age / Sex: 62 y.o., female  PCP: Myrlene Broker, MD Referring Physician: Vassie Loll, MD  Reason for Consultation: Establishing goals of care  HPI/Patient Profile: 62 y.o. female  with past medical history of end-stage/terminal COPD under [facility] hosp[facility] care, OSA on CPAP, obesity, HTN/HLD, DM2, PTSD, anxiety/depression, hypothyroid, PAD/neuropathy admitted on 09/10/2022 with lower extremity cellulitis.   Clinical Assessment and Goals of Care: Attempted to see [name] earlier today but she was sleeping soundly and did not wake when I called her name.   I returned later in the day. I have reviewed medical records including EPIC notes, labs and imaging, received report from RN, assessed the patient.  [name] is sitting on the edge of the bed in her room.  She greets me, making and keeping eye contact.  She appears chronically ill and somewhat frail.  She is alert and oriented x 3, able to make her needs known.  There is no family at bedside at this time.   We meet at the bedside to discuss diagnosis prognosis, GOC, EOL wishes, disposition and options. I introduced Palliative Medicine as specialized medical care for people living with serious illness. It focuses on providing relief from the symptoms and stress of a serious illness. The goal is to improve quality of life for both the patient and the family.  We focused on their current illness.  We talk about her lower extremity cellulitis.  Overall, [name] seems knowledgeable about her acute illness and the treatment plan.  She tells me that she will stay in the hospital until she has improved, but then plans to return to her own home with ongoing "treat the treatable" hospice care services already in place with [facility].  The natural disease  trajectory and expectations at EOL were discussed.  Advanced directives, concepts specific to code status, artifical feeding and hydration, and rehospitalization were considered and discussed.  We talk about the concept of "treat the treatable, but allowing natural passing".  I encouraged [name] to consider what she does and does not want.  We talk about the realities of ventilator support for those with COPD.  We talk about setting limits on life support, after 2 weeks she would need trach/PEG.  I share that what we want at 21 is different than what we want it 51 than what we want if [name] willing we lived to 39.  I share that for her changes may occur more rapidly.  I encouraged her to consider her functional status when considering CODE STATUS.  [name] states that in the past she has left this decision to her daughter, but feels that this may not be the right thing to do.  I encouraged her to continue these discussions at home with her trusted hospice nurse and her daughter.  She agrees.  Hospice and Palliative Care services outpatient were discussed.  Ms. Garver is active with [name] for  at home "treat the treatable" hospice care.  Discussed the importance of continued conversation with family and the medical providers regarding overall plan of care and treatment options, ensuring decisions are within the context of the patient's values and GOCs.  Questions and concerns were addressed.  The patient was encouraged to call with questions or concerns.  PMT will continue to support holistically.  Conference with attending, bedside nursing staff, transition of care team related to patient condition, needs, goals of care, disposition.   HCPOA HCPOA - -daughter, [name]     SUMMARY OF RECOMMENDATIONS   At this point continue to treat the treatable. Remains full code under hospice care Active with [facility]   Code Status/Advance Care Planning: Full code  Symptom Management:   Per hospice/hospitalist, no additional needs at this time.  Palliative Prophylaxis:  Frequent Pain Assessment, Oral Care, and Palliative Wound Care  Additional Recommendations (Limitations, Scope, Preferences): Full Scope Treatment  Psycho-social/Spiritual:  Desire for further Chaplaincy support:no Additional Recommendations: Caregiving  Support/Resources and Education on Hospice  Prognosis:  < 6 months - Prognostication for CAPD can be very difficult. Clearly, 6 months or less would be anticipated based on decreasing functional status, chronic illness burden.   Discharge Planning: Anticipate home with "treat the treatable" hospice care with [facility]      Primary Diagnoses: Present on Admission:  Cellulitis of right lower extremity  Hypokalemia  Controlled type 2 diabetes mellitus with neuropathy (HCC)  Essential hypertension  Mixed hyperlipidemia  Acquired hypothyroidism  GERD (gastroesophageal reflux disease)   I have reviewed the medical record, interviewed the patient and family, and examined the patient. The following aspects are pertinent.  Past Medical History:  Diagnosis Date   Acute respiratory failure with hypoxia (HCC)    Alcoholism and drug addiction in family    Anxiety    Appendicitis    Arthritis    "knees, hips, lower back" (09/25/2016)   Asthma    Bipolar disorder (HCC)    Chronic bronchitis (HCC)    "all the time" (09/25/2016)   Chronic leg pain    RLE   Chronic lower back pain    COPD (chronic obstructive pulmonary disease) (HCC)    Depression    Diabetes mellitus without complication (HCC)    Diabetic neuropathy (HCC)    Early cataracts, bilateral    Emphysema of lung (HCC)    Generalized anxiety disorder 02/09/2020   GERD (gastroesophageal reflux disease)    Headache    Hepatitis C    "treated back in 2001-2002"   High cholesterol    Hypertension    Hypothyroidism    Incarcerated ventral hernia 04/04/2017   Mood disorder in conditions  classified elsewhere 02/09/2020   On home oxygen therapy    "2L; at nighttime" (04/04/2017)   OSA on CPAP    CPAP with Oxygen 3 liters   Paranoid (HCC)    Pneumonia    "all the time" (09/25/2016)   Positive PPD    with negative chest x ray   PTSD (post-traumatic stress disorder) 02/09/2020   Tachycardia    patient reports with exertion ; denies any other conditions    Thyroid disease    Wears glasses    Wears partial dentures    Social History   Socioeconomic History   Marital status: Widowed    Spouse name: Not on file   Number of children: 3   Years of education: Not on file   Highest education level: 9th grade  Occupational History   Not on file  Tobacco Use   Smoking status: Every Day    Current packs/day: 2.00    Average packs/day: 2.0 packs/day for 47.5 years (95.1 ttl pk-yrs)    Types: Cigarettes    Start date: 1977    Passive exposure: Current   Smokeless tobacco: Never   Tobacco comments:    1ppd as of 07/30/22 ep  Vaping Use   Vaping status: Never Used  Substance and Sexual Activity   Alcohol use: Not Currently   Drug use: Not Currently    Types: "Crack" cocaine    Comment:  "nothing since 01/12/2012"   Sexual activity: Not Currently    Partners: Male  Other Topics Concern   Not on file  Social History Narrative   Not on file   Social Determinants of Health   Financial Resource Strain: High Risk (08/30/2022)   Overall Financial Resource Strain (CARDIA)    Difficulty of Paying Living Expenses: Hard  Food Insecurity: No Food Insecurity (09/11/2022)   Hunger Vital Sign    Worried About Running Out of Food in the Last Year: Never true    Ran Out of Food in the Last Year: Never true  Recent Concern: Food Insecurity - Food Insecurity Present (08/12/2022)   Hunger Vital Sign    Worried About Running Out of Food in the Last Year: Sometimes true    Ran Out of Food in the Last Year: Sometimes true  Transportation Needs: No Transportation Needs (09/11/2022)    PRAPARE - Administrator, Civil Service (Medical): No    Lack of Transportation (Non-Medical): No  Recent Concern: Transportation Needs - Unmet Transportation Needs (08/30/2022)   PRAPARE - Transportation    Lack of Transportation (Medical): Yes    Lack of Transportation (Non-Medical): Yes  Physical Activity: Inactive (08/30/2022)   Exercise Vital Sign    Days of Exercise per Week: 0 days    Minutes of Exercise per Session: 0 min  Stress: No Stress Concern Present (08/30/2022)   [employer] of Occupational Health - Occupational Stress Questionnaire    Feeling of Stress : Only a little  Recent Concern: Stress - Stress Concern Present (08/12/2022)   [employer] of Occupational Health - Occupational Stress Questionnaire    Feeling of Stress : Very much  Social Connections: Moderately Isolated (08/30/2022)   Social Connection and Isolation Panel [NHANES]    Frequency of Communication with Friends and Family: Once a week    Frequency of Social Gatherings with Friends and Family: Once a week    Attends Religious Services: 1 to 4 times per year    Active Member of [employer] or Organizations: Yes    Attends Banker Meetings: 1 to 4 times per year    Marital Status: Widowed   Family History  Problem Relation Age of Onset   Diabetes Mother    Hypertension Mother    Hypertension Father    Cancer Father        lung   Breast cancer Neg Hx    Scheduled Meds:  albuterol  2.5 mg Nebulization BID   atenolol  25 mg Oral Daily   atorvastatin  20 mg Oral Daily   buPROPion  300 mg Oral Daily   busPIRone  5 mg Oral QHS   cyclobenzaprine  10 mg Oral QHS   enoxaparin (LOVENOX) injection  40 mg Subcutaneous Daily   hydrochlorothiazide  25 mg Oral Daily   ibuprofen  600 mg  Oral TID   insulin aspart  0-15 Units Subcutaneous TID WC   levothyroxine  25 mcg Oral Q0600   montelukast  10 mg Oral QHS   pantoprazole  40 mg Oral BID   pregabalin  300 mg Oral BID   QUEtiapine   100 mg Oral QHS   roflumilast  500 mcg Oral Daily   traZODone  100 mg Oral QHS   venlafaxine XR  150 mg Oral Q breakfast   Continuous Infusions:  sodium chloride 10 mL/hr at 09/12/22 0931   vancomycin 1,250 mg (09/12/22 0933)   PRN Meds:.sodium chloride, acetaminophen **OR** acetaminophen, albuterol, diazepam, melatonin, ondansetron **OR** ondansetron (ZOFRAN) IV, polyethylene glycol Medications Prior to Admission:  Prior to Admission medications   Medication Sig Start Date End Date Taking? Authorizing Provider  Accu-Chek Softclix Lancets lancets Use as instructed 05/21/21   Myrlene Broker, MD  acyclovir (ZOVIRAX) 400 MG tablet TAKE 1 TABLET BY MOUTH TWICE  DAILY 08/14/22   Myrlene Broker, MD  albuterol (PROVENTIL) (2.5 MG/3ML) 0.083% nebulizer solution Take 3 mLs (2.5 mg total) by nebulization 2 (two) times daily. And every 6 hours as needed.  DX: J44.9 01/05/21   Bevelyn Ngo, NP  atenolol (TENORMIN) 25 MG tablet TAKE 1 TABLET BY MOUTH  DAILY 01/28/22   Myrlene Broker, MD  atorvastatin (LIPITOR) 20 MG tablet TAKE 1 TABLET BY MOUTH ONCE  DAILY 07/23/22   Myrlene Broker, MD  Blood Glucose Monitoring Suppl DEVI 1 each by Does not apply route in the morning, at noon, and at bedtime. May substitute to any manufacturer covered by patient's insurance. 08/28/22   Myrlene Broker, MD  Budeson-Glycopyrrol-Formoterol (BREZTRI AEROSPHERE) 160-9-4.8 MCG/ACT AERO Inhale 2 puffs into the lungs in the morning and at bedtime. 05/08/21   Oretha Milch, MD  buPROPion (WELLBUTRIN XL) 300 MG 24 hr tablet TAKE 1 TABLET BY MOUTH DAILY 06/04/22   Thresa Ross, MD  busPIRone (BUSPAR) 5 MG tablet Take 1 tablet (5 mg total) by mouth at bedtime. 06/17/22   Thresa Ross, MD  cyclobenzaprine (FLEXERIL) 10 MG tablet TAKE 1 TABLET BY MOUTH AT  BEDTIME 07/31/22   Myrlene Broker, MD  diazepam (VALIUM) 5 MG tablet Take 1 tablet (5 mg total) by mouth every 8 (eight) hours as needed for  anxiety. 02/15/22 02/15/23  Bedelia Person, MD  diphenhydrAMINE (BENADRYL) 25 mg capsule Take 25 mg by mouth 2 (two) times daily.    [provider]  fluticasone (FLONASE) 50 MCG/ACT nasal spray Place 2 sprays into both nostrils daily. Patient taking differently: Place 2 sprays into both nostrils daily as needed for allergies. 09/05/21 02/07/22  Alfonse Spruce, MD  Glucose Blood (BLOOD GLUCOSE TEST STRIPS) STRP 1 each by In Vitro route in the morning, at noon, and at bedtime. May substitute to any manufacturer covered by patient's insurance. 08/28/22 09/27/22  Myrlene Broker, MD  guaiFENesin-Codeine 225-7.5 MG/5ML LIQD Take 5 mLs by mouth 3 (three) times daily as needed. 07/30/22   Oretha Milch, MD  hydrochlorothiazide (HYDRODIURIL) 25 MG tablet TAKE 1 TABLET BY MOUTH DAILY 07/23/22   Myrlene Broker, MD  Ipratropium-Albuterol (COMBIVENT RESPIMAT) 20-100 MCG/ACT AERS respimat Inhale 1 puff into the lungs every 6 (six) hours as needed for wheezing or shortness of breath. 04/02/22   Bevelyn Ngo, NP  Lancet Device MISC 1 each by Does not apply route in the morning, at noon, and at bedtime. May substitute to any manufacturer covered  by patient's insurance. 08/13/22 09/12/22  Myrlene Broker, MD  Lancets Misc. MISC 1 each by Does not apply route in the morning, at noon, and at bedtime. May substitute to any manufacturer covered by patient's insurance. 08/28/22 09/27/22  Myrlene Broker, MD  levofloxacin (LEVAQUIN) 500 MG tablet Take 1 tablet (500 mg total) by mouth daily for 5 days. 09/09/22 09/14/22  Sherryll Burger, Pratik D, DO  levothyroxine (SYNTHROID) 25 MCG tablet TAKE 1 TABLET BY MOUTH DAILY  BEFORE BREAKFAST 07/23/22   Myrlene Broker, MD  MELATONIN PO Take 1 tablet by mouth at bedtime as needed (sleep).    [provider]  montelukast (SINGULAIR) 10 MG tablet TAKE 1 TABLET BY MOUTH AT  BEDTIME 07/23/22   Oretha Milch, MD  naproxen (NAPROSYN) 500 MG tablet Take 1  tablet (500 mg total) by mouth 2 (two) times daily with a meal. 03/29/22   Myrlene Broker, MD  nystatin-triamcinolone ointment [facility]) Apply 1 application topically 2 (two) times daily as needed (applied to skin folds/groins/under breast skin irritation rash.). 09/08/17   Myrlene Broker, MD  OXYGEN Inhale 3 L/min into the lungs See admin instructions. Inhale  3 L/min of oxygen into the lungs w/CPAP at bedtime and as needed for shortness of breath with "strenuous activity" during the day    [provider]  pantoprazole (PROTONIX) 40 MG tablet TAKE 1 TABLET BY MOUTH DAILY 12/21/21   Myrlene Broker, MD  PATADAY 0.2 % SOLN Place 1 drop into both eyes daily. 08/20/19   [provider]  polyethylene glycol (MIRALAX / GLYCOLAX) 17 g packet Take 17 g by mouth daily as needed for mild constipation. 04/14/19   Meuth, Brooke A, PA-C  pregabalin (LYRICA) 300 MG capsule Take 1 capsule (300 mg total) by mouth 2 (two) times daily. 03/29/22   Myrlene Broker, MD  QUEtiapine (SEROQUEL) 100 MG tablet TAKE 1 TABLET BY MOUTH AT  BEDTIME 07/31/22   Thresa Ross, MD  roflumilast (DALIRESP) 500 MCG TABS tablet Take 1 tablet (500 mcg total) by mouth daily. 04/02/22   Bevelyn Ngo, NP  traZODone (DESYREL) 100 MG tablet TAKE 1 TABLET BY MOUTH AT  BEDTIME 12/21/21   Myrlene Broker, MD  triamcinolone cream (KENALOG) 0.1 % Apply 1 Application topically 2 (two) times daily. 08/20/22   Myrlene Broker, MD  Varenicline Tartrate, Starter, (CHANTIX STARTING MONTH PAK) 0.5 MG X 11 & 1 MG X 42 TBPK Chantix started pack Take 0.5 mg once daily x 3 days, then take 0.5 mg twice daily x 4 days, then take 1 mg twice daily thereafter. Patient not taking: Reported on 09/08/2022 04/12/22   Bevelyn Ngo, NP  venlafaxine XR (EFFEXOR-XR) 150 MG 24 hr capsule TAKE 1 CAPSULE BY MOUTH DAILY  WITH BREAKFAST 12/21/21   Myrlene Broker, MD  witch hazel-glycerin (TUCKS) pad Apply topically as  needed for itching. 08/18/20   Standley Brooking, MD   Allergies  Allergen Reactions   Keflex [Cephalexin] Rash and Other (See Comments)    Has tolerated amoxicillin since had keflex   Shellfish Allergy Shortness Of Breath, Nausea And Vomiting and Other (See Comments)    Stomach cramps   Tetracyclines & Related Anaphylaxis, Swelling and Other (See Comments)    Throat swelling requiring hospitalization   Dilaudid [Hydromorphone Hcl] Other (See Comments)    "Lethargy"   Prednisone Other (See Comments)    "counter reacts", has tolerated IV medrol   Clindamycin/Lincomycin Other (  See Comments)    Burning sensation in her mouth down her throat    Incruse Ellipta [Umeclidinium Bromide] Nausea Only   Molds & Smuts Itching   Tuberculin Tests Other (See Comments)    False postive   Umeclidinium     Other Reaction(s): GI Intolerance   Review of Systems  Unable to perform ROS: Other    Physical Exam Vitals and nursing note reviewed.     Vital Signs: BP 101/88   Pulse 71   Temp 97.7 F (36.5 C) (Oral)   Resp 18   Ht 5\' 8"  (1.727 m)   Wt 90.2 kg   SpO2 100%   BMI 30.24 kg/m  Pain Scale: 0-10   Pain Score: 0-No pain   SpO2: SpO2: 100 % O2 Device:SpO2: 100 % O2 Flow Rate: .O2 Flow Rate (L/min): 4 L/min  IO: Intake/output summary:  Intake/Output Summary (Last 24 hours) at 09/12/2022 1359 Last data filed at 09/12/2022 0900 Gross per 24 hour  Intake 640 ml  Output --  Net 640 ml    LBM: Last BM Date : 09/10/22 Baseline Weight: Weight: 90.2 kg Most recent weight: Weight: 90.2 kg     Palliative Assessment/Data:     Time In: 1000 Time Out: 1055 Time Total: 55 minutes  Greater than 50%  of this time was spent counseling and coordinating care related to the above assessment and plan.  Signed by: Katheran Awe, NP   Please contact Palliative Medicine Team phone at [phone] for questions and concerns.  For individual provider: See Loretha Stapler

## 2022-09-12 NOTE — Progress Notes (Signed)
Tech concerned that patient lethargic. 100% on 3 liters and turned down to 2 liters.  Another nurse placed patient on cpap.  Dr. Gwenlyn Perking contacted and stated patient not sleeping well at night and that cpap settings might need to be adjusted and to put on cpap earlier in the night.  Very soon after patient took mask off and asked to go to bathroom and now wants to take a shower. Mrsa swab sent to lab for hx of mrse

## 2022-09-12 NOTE — Progress Notes (Signed)
Alert and oriented and has been ambulating in room  Foot still red with edema and blister.  Given potassium and magnesium.  Hospice nurse visited today.

## 2022-09-12 NOTE — Progress Notes (Signed)
Progress Note   Patient: Joan [name] UJW:[id number] DOB: 1960-06-01 DOA: 09/10/2022     1 DOS: the patient was seen and examined on 09/12/2022   Brief hospital admission narrative: As per Dr. Thomes Dinning H&P on 09/11/2022  Joan [name] is a 62 y.o. female with medical history significant of terminal COPD on hospice care, hypertension, hyperlipidemia, T2DM, OSA on CPAP who presented to the emergency department due to worsening redness of her foot and shortness of breath. Patient was admitted from 7/14 to 7/15 due to right lower extremity cellulitis with failed outpatient treatment, she requested to be discharged home, but on getting home, she endorsed worsening shortness of breath and worsening pain and redness of her right foot, so she decided to return to the ED for further evaluation and management. She denies chest pain, fever, chills, nausea, vomiting, abdominal pain.   ED Course:  In the emergency department, she was tachypneic with a respiratory rate of 28/min, O2 sats was 98% on supplemental oxygen via [place] at 4 LPM, other vital signs were within normal range.  Workup in the ED showed normal CBC and BMP except for potassium of 3.4, glucose 102, creatinine 1.01.  Blood culture pending. Chest x-ray showed emphysematous and chronic bronchitic changes in the lungs.  No evidence of active pulmonary disease. Hospitalist was asked to admit patient for further evaluation and management.  Assessment and Plan: Lower extremity cellulitis -Continue IV antibiotic -Continue the use of ibuprofen to assist with pain and inflammatory changes -Keep leg elevated -Follow clinical response. -Patient demonstrating adequate improvement.  Hypokalemia -Continue to follow ultralights and further replete as needed.  GERD -Continue PPI.  Hyperlipidemia -Continue statin.  Hypertension -Resume home antihypertensive agents -Follow-up vital signs. -Heart healthy diet discussed with  patient.  Chronic respiratory failure with hypoxia in the setting of COPD (end-stage/terminal) -Continue outpatient follow-up with hospice -Continue oxygen supplementation -Continue home bronchodilator management. -No acute exacerbation currently appreciated.  Obstructive sleep apnea -Continue CPAP nightly.  Anxiety/depression -Continue anxiolytic/antidepressant medication -Overall mood stable.  Type 2 diabetes mellitus -Recent A1c 6.8 -Continue sliding scale insulin while inpatient -Modified carbohydrate diet discussed with patient -Continue to follow CBGs fluctuation and adjust hypoglycemic regimen as needed.  Class I obesity -Low-calorie diet and portion control discussed with patient -Body mass index is 30.24 kg/m.    Subjective:  Afebrile, no chest pain, noted improvement in her right lower extremity swelling and pain.  Physical Exam: Vitals:   09/11/22 2240 09/12/22 0411 09/12/22 0835 09/12/22 0900  BP:  (!) 92/56  101/88  Pulse:  (!) 59  71  Resp:  18    Temp:  97.7 F (36.5 C)    TempSrc:  Oral    SpO2: 100% 94% 100%   Weight:      Height:       General exam: Alert, awake, oriented x 3; reporting improvement in her right lower extremity swelling and pain.  No chest pain, no nausea, no vomiting.  Expressed breathing at baseline. Respiratory system: Mild expiratory wheezing, diffuse rhonchi bilaterally; no using accessory muscles.  No tachypnea.  Good saturation on chronic supplementation. Cardiovascular system:RRR. No rubs or gallops; no JVD. Gastrointestinal system: Abdomen is nondistended, soft and nontender. No organomegaly or masses felt. Normal bowel sounds heard. Central nervous system: No focal neurological deficits. Extremities: No cyanosis. Skin: No petechiae.  Cellulitic process appreciated on her right lower extremity (affecting midline lower aspect of the leg and food). Psychiatry: Judgement and insight appear normal. Mood & affect  appropriate.    Data Reviewed: Basic metabolic panel: Sodium 138, potassium 3.3, chloride 101, bicarb 29, BUN 9, creatinine 0.71 and GFR >60 CBC: WBC 8.2, hemoglobin 11.2 and platelet count 243 K  Family Communication: No family at bedside.  Disposition: Status is: Inpatient Remains inpatient appropriate because: Continue IV antibiotics.   Planned Discharge Destination: Home  Time spent: 50 minutes  Author: Vassie Loll, MD 09/12/2022 1:02 PM  For on call review [email or web address].

## 2022-09-13 ENCOUNTER — Other Ambulatory Visit (HOSPITAL_COMMUNITY): Payer: Self-pay

## 2022-09-13 ENCOUNTER — Emergency Department (HOSPITAL_COMMUNITY)
Admission: EM | Admit: 2022-09-13 | Discharge: 2022-09-14 | Disposition: A | Discharge status: Home / Self Care | Attending: Emergency Medicine | Admitting: Emergency Medicine

## 2022-09-13 ENCOUNTER — Other Ambulatory Visit: Payer: Self-pay

## 2022-09-13 ENCOUNTER — Encounter (HOSPITAL_COMMUNITY): Payer: Self-pay | Admitting: Emergency Medicine

## 2022-09-13 DIAGNOSIS — I10 Essential (primary) hypertension: Secondary | ICD-10-CM | POA: Insufficient documentation

## 2022-09-13 DIAGNOSIS — K219 Gastro-esophageal reflux disease without esophagitis: Secondary | ICD-10-CM | POA: Diagnosis not present

## 2022-09-13 DIAGNOSIS — E039 Hypothyroidism, unspecified: Secondary | ICD-10-CM | POA: Diagnosis not present

## 2022-09-13 DIAGNOSIS — R0602 Shortness of breath: Secondary | ICD-10-CM | POA: Diagnosis not present

## 2022-09-13 DIAGNOSIS — E669 Obesity, unspecified: Secondary | ICD-10-CM

## 2022-09-13 DIAGNOSIS — F17210 Nicotine dependence, cigarettes, uncomplicated: Secondary | ICD-10-CM | POA: Diagnosis not present

## 2022-09-13 DIAGNOSIS — S199XXA Unspecified injury of neck, initial encounter: Secondary | ICD-10-CM | POA: Diagnosis not present

## 2022-09-13 DIAGNOSIS — G4733 Obstructive sleep apnea (adult) (pediatric): Secondary | ICD-10-CM

## 2022-09-13 DIAGNOSIS — E782 Mixed hyperlipidemia: Secondary | ICD-10-CM

## 2022-09-13 DIAGNOSIS — W19XXXA Unspecified fall, initial encounter: Secondary | ICD-10-CM

## 2022-09-13 DIAGNOSIS — W1830XA Fall on same level, unspecified, initial encounter: Secondary | ICD-10-CM | POA: Diagnosis not present

## 2022-09-13 DIAGNOSIS — J439 Emphysema, unspecified: Secondary | ICD-10-CM | POA: Diagnosis not present

## 2022-09-13 DIAGNOSIS — E1140 Type 2 diabetes mellitus with diabetic neuropathy, unspecified: Secondary | ICD-10-CM

## 2022-09-13 DIAGNOSIS — E876 Hypokalemia: Secondary | ICD-10-CM

## 2022-09-13 DIAGNOSIS — E119 Type 2 diabetes mellitus without complications: Secondary | ICD-10-CM | POA: Insufficient documentation

## 2022-09-13 DIAGNOSIS — S0990XA Unspecified injury of head, initial encounter: Secondary | ICD-10-CM | POA: Diagnosis not present

## 2022-09-13 DIAGNOSIS — R519 Headache, unspecified: Secondary | ICD-10-CM | POA: Diagnosis not present

## 2022-09-13 DIAGNOSIS — L03115 Cellulitis of right lower limb: Principal | ICD-10-CM

## 2022-09-13 DIAGNOSIS — J449 Chronic obstructive pulmonary disease, unspecified: Secondary | ICD-10-CM | POA: Diagnosis not present

## 2022-09-13 LAB — BASIC METABOLIC PANEL
Anion gap: 11 (ref 5–15)
BUN: 15 mg/dL (ref 6–20)
CO2: 26 mmol/L (ref 22–32)
Calcium: 8.5 mg/dL — ABNORMAL LOW (ref 8.9–10.3)
Chloride: 102 mmol/L (ref 98–111)
Creatinine, Ser: 0.76 mg/dL (ref 0.44–1.00)
GFR, Estimated: >60 mL/min (ref >60)
Glucose, Bld: 127 mg/dL — ABNORMAL HIGH (ref 70–99)
Potassium: 3.7 mmol/L (ref 3.5–5.1)
Sodium: 139 mmol/L (ref 135–145)

## 2022-09-13 LAB — CULTURE, BLOOD (ROUTINE X 2)
Culture: NO GROWTH
Culture: NO GROWTH
Special Requests: ADEQUATE

## 2022-09-13 LAB — MAGNESIUM: Magnesium: 2.1 mg/dL (ref 1.7–2.4)

## 2022-09-13 LAB — GLUCOSE, CAPILLARY
Glucose-Capillary: 128 mg/dL — ABNORMAL HIGH (ref 70–99)
Glucose-Capillary: 86 mg/dL (ref 70–99)

## 2022-09-13 MED ORDER — ALBUTEROL SULFATE HFA 108 (90 BASE) MCG/ACT IN AERS: 2.0000 | INHALATION_SPRAY | RESPIRATORY_TRACT | Status: DC | PRN

## 2022-09-13 MED ORDER — LINEZOLID 600 MG PO TABS: 600.0000 mg | ORAL_TABLET | Freq: Two times a day (BID) | ORAL | 0 refills | Status: AC

## 2022-09-13 NOTE — Plan of Care (Signed)
  Problem: Acute Rehab PT Goals(only PT should resolve) Goal: Pt will Roll Supine to Side Outcome: Progressing Flowsheets (Taken 09/13/2022 1212) Pt will Roll Supine to Side: Independently Goal: Patient Will Transfer Sit To/From Stand Outcome: Progressing Flowsheets (Taken 09/13/2022 1212) Patient will transfer sit to/from stand: Independently Goal: Pt Will Transfer Bed To Chair/Chair To Bed Outcome: Progressing Flowsheets (Taken 09/13/2022 1212) Pt will Transfer Bed to Chair/Chair to Bed: Independently Goal: Pt Will Ambulate Outcome: Progressing Flowsheets (Taken 09/13/2022 1212) Pt will Ambulate: 100 feet    Rainee Sweatt SPT [place], DPT Program

## 2022-09-13 NOTE — Progress Notes (Signed)
Patient discharged home today, transported home by family. Discharge summary went over with patient by [facility]. Patients family did not bring oxygen tank, Jesse Sans checked patients oxygen on Room air was 93-97% at room air. Patient stated she was leaving without oxygen tank, patient educated. Belongings sent with patient.
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
## 2022-09-13 NOTE — Discharge Summary (Signed)
Physician Discharge Summary   Patient: Joan [name] MRN: [id number] DOB: [birth year]  Admit date:     09/10/2022  Discharge date: 09/13/22  Discharge Physician: Vassie Loll   PCP: Myrlene Broker, MD   Recommendations at discharge:  Repeat BMET to follow electrolytes and renal function Repeat CBC to follow WBC's and Hgb stability Reassess BP and adjust antihypertensive regimen as needed. Continue to follow up CBG's and make further decisions regarding initiation of hypoglycemic agents. GOC and Advance care planing discussion recommended; patient with terminal COPD, actively following with hospice, but still full code.  Discharge Diagnoses: Principal Problem:   Cellulitis of right lower extremity Active Problems:   Hypokalemia   Essential hypertension   Mixed hyperlipidemia   GERD (gastroesophageal reflux disease)   OSA on CPAP   Acquired hypothyroidism   Controlled type 2 diabetes mellitus with neuropathy (HCC)   Obesity (BMI 30-39.9)  Brief hospital admission narrative: As per Dr. Thomes Dinning H&P on 09/11/2022  Joan [name] is a 62 y.o. female with medical history significant of terminal COPD on hospice care, hypertension, hyperlipidemia, T2DM, OSA on CPAP who presented to the emergency department due to worsening redness of her foot and shortness of breath. Patient was admitted from 7/14 to 7/15 due to right lower extremity cellulitis with failed outpatient treatment, she requested to be discharged home, but on getting home, she endorsed worsening shortness of breath and worsening pain and redness of her right foot, so she decided to return to the ED for further evaluation and management. She denies chest pain, fever, chills, nausea, vomiting, abdominal pain.   ED Course:  In the emergency department, she was tachypneic with a respiratory rate of 28/min, O2 sats was 98% on supplemental oxygen via [place] at 4 LPM, other vital signs were within normal range.  Workup in  the ED showed normal CBC and BMP except for potassium of 3.4, glucose 102, creatinine 1.01.  Blood culture pending. Chest x-ray showed emphysematous and chronic bronchitic changes in the lungs.  No evidence of active pulmonary disease. Hospitalist was asked to admit patient for further evaluation and management.  Assessment and Plan: Lower extremity cellulitis -after 3 days of IV antibiotics will discharge home on zyvox with instructions for 5 more days of antibiotics.  -Keep leg elevated as much as possible. -Patient demonstrating adequate improvement.   Hypokalemia -repleted/stable -will discharge home with instructions to maintain adequate hydration/nutrition and for her PCP to follow electrolyte trend.   GERD -Continue PPI.   Hyperlipidemia -Continue statin.   Hypertension -Resume home antihypertensive agents -Follow-up vital signs. -Heart healthy diet discussed with patient.   Chronic respiratory failure with hypoxia in the setting of COPD (end-stage/terminal) -Continue outpatient follow-up with hospice and pulmonology service. -Continue oxygen supplementation -Continue home bronchodilator management. -No acute exacerbation currently appreciated.   Obstructive sleep apnea -Continue CPAP nightly. -patient encouraged to be compliant with machine usage.    Anxiety/depression -Continue anxiolytic/antidepressant medication -Overall mood stable.   Type 2 diabetes mellitus -Recent A1c 6.8 -Continue sliding scale insulin while inpatient -Modified carbohydrate diet discussed with patient -Continue to follow CBGs fluctuation and further decide on hypoglycemic regimen as needed.   Class I obesity -Low-calorie diet and portion control discussed with patient -Body mass index is 30.24 kg/m.    Consultants: palliative care Procedures performed: see below for xray reports.   Disposition: Home Diet recommendation: heart healthy and modified carb diet.   DISCHARGE  MEDICATION: Allergies as of 09/13/2022  Reactions   Keflex [cephalexin] Rash, Other (See Comments)   Has tolerated amoxicillin since had keflex   Shellfish Allergy Shortness Of Breath, Nausea And Vomiting, Other (See Comments)   Stomach cramps   Tetracyclines & Related Anaphylaxis, Swelling, Other (See Comments)   Throat swelling requiring hospitalization   Dilaudid [hydromorphone Hcl] Other (See Comments)   "Lethargy"   Prednisone Other (See Comments)   "counter reacts", has tolerated IV medrol   Clindamycin/lincomycin Other (See Comments)   Burning sensation in her mouth down her throat    Incruse Ellipta [umeclidinium Bromide] Nausea Only   Molds & Smuts Itching   Tuberculin Tests Other (See Comments)   False postive   Umeclidinium    Other Reaction(s): GI Intolerance        Medication List     STOP taking these medications    diazepam 5 MG tablet Commonly known as: Valium   Lancet Device Misc   levofloxacin 500 MG tablet Commonly known as: Levaquin   Varenicline Tartrate (Starter) 0.5 MG X 11 & 1 MG X 42 Tbpk Commonly known as: Chantix Starting Month Pak       TAKE these medications    Accu-Chek Softclix Lancets lancets Use as instructed   acyclovir 400 MG tablet Commonly known as: ZOVIRAX TAKE 1 TABLET BY MOUTH TWICE  DAILY   albuterol (2.5 MG/3ML) 0.083% nebulizer solution Commonly known as: PROVENTIL Take 3 mLs (2.5 mg total) by nebulization 2 (two) times daily. And every 6 hours as needed.  DX: J44.9   atenolol 25 MG tablet Commonly known as: TENORMIN TAKE 1 TABLET BY MOUTH  DAILY   atorvastatin 20 MG tablet Commonly known as: LIPITOR TAKE 1 TABLET BY MOUTH ONCE  DAILY   Blood Glucose Monitoring Suppl Devi 1 each by Does not apply route in the morning, at noon, and at bedtime. May substitute to any manufacturer covered by patient's insurance.   BLOOD GLUCOSE TEST STRIPS Strp 1 each by In Vitro route in the morning, at noon, and at  bedtime. May substitute to any manufacturer covered by patient's insurance.   Breztri Aerosphere 160-9-4.8 MCG/ACT Aero Generic drug: Budeson-Glycopyrrol-Formoterol Inhale 2 puffs into the lungs in the morning and at bedtime.   buPROPion 300 MG 24 hr tablet Commonly known as: WELLBUTRIN XL TAKE 1 TABLET BY MOUTH DAILY   busPIRone 5 MG tablet Commonly known as: BUSPAR Take 1 tablet (5 mg total) by mouth at bedtime.   Combivent Respimat 20-100 MCG/ACT Aers respimat Generic drug: Ipratropium-Albuterol Inhale 1 puff into the lungs every 6 (six) hours as needed for wheezing or shortness of breath.   cyclobenzaprine 10 MG tablet Commonly known as: FLEXERIL TAKE 1 TABLET BY MOUTH AT  BEDTIME   diphenhydrAMINE 25 mg capsule Commonly known as: BENADRYL Take 25 mg by mouth 2 (two) times daily.   fluticasone 50 MCG/ACT nasal spray Commonly known as: FLONASE Place 2 sprays into both nostrils daily. What changed:  when to take this reasons to take this   guaiFENesin-Codeine 225-7.5 MG/5ML Liqd Take 5 mLs by mouth 3 (three) times daily as needed.   hydrochlorothiazide 25 MG tablet Commonly known as: HYDRODIURIL TAKE 1 TABLET BY MOUTH DAILY   Lancets Misc. Misc 1 each by Does not apply route in the morning, at noon, and at bedtime. May substitute to any manufacturer covered by patient's insurance.   levothyroxine 25 MCG tablet Commonly known as: SYNTHROID TAKE 1 TABLET BY MOUTH DAILY  BEFORE BREAKFAST   linezolid 600  MG tablet Commonly known as: Zyvox Take 1 tablet (600 mg total) by mouth 2 (two) times daily for 5 days.   MELATONIN PO Take 1 tablet by mouth at bedtime as needed (sleep).   montelukast 10 MG tablet Commonly known as: SINGULAIR TAKE 1 TABLET BY MOUTH AT  BEDTIME   naproxen 500 MG tablet Commonly known as: NAPROSYN Take 1 tablet (500 mg total) by mouth 2 (two) times daily with a meal.   nystatin-triamcinolone ointment Commonly known as: MYCOLOG Apply 1  application topically 2 (two) times daily as needed (applied to skin folds/groins/under breast skin irritation rash.).   OXYGEN Inhale 3 L/min into the lungs See admin instructions. Inhale  3 L/min of oxygen into the lungs w/CPAP at bedtime and as needed for shortness of breath with "strenuous activity" during the day   pantoprazole 40 MG tablet Commonly known as: PROTONIX TAKE 1 TABLET BY MOUTH DAILY   Pataday 0.2 % Soln Generic drug: Olopatadine HCl Place 1 drop into both eyes daily.   polyethylene glycol 17 g packet Commonly known as: MIRALAX / GLYCOLAX Take 17 g by mouth daily as needed for mild constipation.   pregabalin 300 MG capsule Commonly known as: Lyrica Take 1 capsule (300 mg total) by mouth 2 (two) times daily.   QUEtiapine 100 MG tablet Commonly known as: SEROQUEL TAKE 1 TABLET BY MOUTH AT  BEDTIME   roflumilast 500 MCG Tabs tablet Commonly known as: DALIRESP Take 1 tablet (500 mcg total) by mouth daily.   traZODone 100 MG tablet Commonly known as: DESYREL TAKE 1 TABLET BY MOUTH AT  BEDTIME   triamcinolone cream 0.1 % Commonly known as: KENALOG Apply 1 Application topically 2 (two) times daily.   venlafaxine XR 150 MG 24 hr capsule Commonly known as: EFFEXOR-XR TAKE 1 CAPSULE BY MOUTH DAILY  WITH BREAKFAST   witch hazel-glycerin pad Commonly known as: TUCKS Apply topically as needed for itching.        Follow-up Information     Myrlene Broker, MD. Schedule an appointment as soon as possible for a visit in 10 day(s).   Specialty: Internal Medicine Contact information: [address] [place] [address] [phone]                Discharge Exam: Ceasar Mons Weights   09/11/22 0142  Weight: 90.2 kg   General exam: Alert, awake, oriented x 3; reporting significant improvement in her right lower extremity swelling and pain. Patient feeling ready to go home. No chest pain, no nausea, no vomiting.  Expressed breathing at  baseline. Respiratory system: Mild expiratory wheezing, diffuse rhonchi bilaterally; no using accessory muscles.  No tachypnea.  Good saturation on chronic supplementation. Cardiovascular system:RRR. No rubs or gallops; no JVD. Gastrointestinal system: Abdomen is nondistended, soft and nontender. No organomegaly or masses felt. Normal bowel sounds heard. Central nervous system: No focal neurological deficits. Extremities: No cyanosis. Skin: No petechiae.  Significant improvement in Cellulitic process appreciated on her right lower extremity (affecting midline lower aspect of the leg and foot). Psychiatry: Judgement and insight appear normal. Mood & affect appropriate.   Condition at discharge: stable and improved.   The results of significant diagnostics from this hospitalization (including imaging, microbiology, ancillary and laboratory) are listed below for reference.   Imaging Studies: DG Chest 2 View  Result Date: 09/10/2022 CLINICAL DATA:  COPD. Shortness of breath and bilateral lower edema. EXAM: CHEST - 2 VIEW COMPARISON:  07/31/2022 FINDINGS: Emphysematous changes in the lungs. Heart  size and pulmonary vascularity are normal. Peribronchial thickening suggesting chronic bronchitis. No airspace disease or consolidation. Prominent left cardiac fat pad. No pleural effusions. No pneumothorax. Mediastinal contours appear intact. Calcification of the aorta. IMPRESSION: Emphysematous and chronic bronchitic changes in the lungs. No evidence of active pulmonary disease. Electronically Signed   By: Burman Nieves M.D.   On: 09/10/2022 22:01   US ARTERIAL ABI (SCREENING LOWER EXTREMITY)  Result Date: 09/08/2022 CLINICAL DATA:  Cellulitis. Evaluate for peripheral arterial disease. EXAM: NONINVASIVE PHYSIOLOGIC VASCULAR STUDY OF BILATERAL LOWER EXTREMITIES TECHNIQUE: Evaluation of both lower extremities were performed at rest, including calculation of ankle-brachial indices with single level Doppler,  pressure and pulse volume recording. COMPARISON:  CT abdomen and pelvis-04/23/2021 FINDINGS: Right ABI:  1.1 Left ABI:  1.1 Right Lower Extremity: Normal waveforms are demonstrated within the right dorsalis pedis artery. Biphasic waveforms are demonstrated within the right posterior tibial artery. Left Lower Extremity: Biphasic waveforms are demonstrated within the left posterior tibial artery. Monophasic waveforms are demonstrated within the left dorsalis pedis artery. IMPRESSION: Normal ABIs bilaterally. Electronically Signed   By: Simonne Come M.D.   On: 09/08/2022 11:46   DG Foot Complete Right  Result Date: 09/08/2022 CLINICAL DATA:  Right foot pain and cellulitis, initial encounter EXAM: RIGHT FOOT COMPLETE - 3+ VIEW COMPARISON:  None Available. FINDINGS: Mild soft tissue swelling is noted over the metatarsals. Radiopaque density is again seen posterior to the tibiotalar joint. Irregularity in the medial talar dome is noted of uncertain chronicity. IMPRESSION: Stable radiopaque density posterior to the tibiotalar joint. Irregularity of the medial aspect of the talar dome poorly visualized on prior tibia and fibula films. This may be related to chronic osteochondral defect. Electronically Signed   By: Alcide Clever M.D.   On: 09/08/2022 03:16   DG Foot Complete Left  Result Date: 09/08/2022 CLINICAL DATA:  Recent trauma with foot swelling, initial encounter EXAM: LEFT FOOT - COMPLETE 3+ VIEW COMPARISON:  None Available. FINDINGS: Mild deformity of the distal aspect of the fifth proximal phalanx likely related to prior trauma healing. No soft tissue abnormality is seen. No acute fracture is noted. IMPRESSION: No acute abnormality noted. Electronically Signed   By: Alcide Clever M.D.   On: 09/08/2022 03:14   DG Tibia/Fibula Left  Result Date: 09/08/2022 CLINICAL DATA:  Recent trauma with cellulitis, initial encounter EXAM: LEFT TIBIA AND FIBULA - 2 VIEW COMPARISON:  None Available. FINDINGS: No acute  fracture or dislocation is noted. No soft tissue abnormality is seen. IMPRESSION: No acute abnormality noted. Electronically Signed   By: Alcide Clever M.D.   On: 09/08/2022 03:13   DG Tibia/Fibula Right  Result Date: 09/08/2022 CLINICAL DATA:  Recent trauma with cellulitis, initial encounter EXAM: RIGHT TIBIA AND FIBULA - 2 VIEW COMPARISON:  None Available. FINDINGS: No acute fracture or dislocation is noted. No soft tissue abnormality is seen. Linear density is noted posterior to the talar tibial joint which may represent a radiopaque foreign body. IMPRESSION: Distal right leg foreign body posterior to the tibiotalar joint. Electronically Signed   By: Alcide Clever M.D.   On: 09/08/2022 03:13    Microbiology: Results for orders placed or performed during the hospital encounter of 09/10/22  MRSA Next Gen by PCR, Nasal     Status: None   Collection Time: 09/12/22  6:20 PM   Specimen: Nasal Mucosa; Nasal Swab  Result Value Ref Range Status   MRSA by PCR Next Gen NOT DETECTED NOT DETECTED Final  Comment: (NOTE) The GeneXpert MRSA Assay (FDA approved for NASAL specimens only), is one component of a comprehensive MRSA colonization surveillance program. It is not intended to diagnose MRSA infection nor to guide or monitor treatment for MRSA infections. Test performance is not FDA approved in patients less than 57 years old. Performed at [facility], [address], [place], [place] [address]    *Note: Due to a large number of results and/or encounters for the requested time period, some results have not been displayed. A complete set of results can be found in Results Review.    Labs: CBC: Recent Labs  Lab 09/08/22 0325 09/09/22 0534 09/10/22 2105 09/11/22 0659 09/12/22 0405  WBC 8.8 7.0 8.3 7.7 8.2  HGB 11.9* 11.4* 12.0 11.7* 11.2*  HCT 36.0 36.8 38.0 37.1 35.8*  MCV 87.6 91.1 91.1 90.9 92.3  PLT 218 186 262 257 243   Basic Metabolic Panel: Recent Labs  Lab 09/08/22 0842  09/09/22 0534 09/10/22 2105 09/11/22 0659 09/12/22 0405 09/13/22 0414  NA  --  136 141 141 138 139  K  --  3.3* 3.4* 3.4* 3.3* 3.7  CL  --  99 103 105 101 102  CO2  --  29 30 32 29 26  GLUCOSE  --  129* 102* 135* 153* 127*  BUN  --  14 14 14 9 15   CREATININE  --  0.79 1.01* 0.77 0.71 0.76  CALCIUM  --  8.3* 8.8* 8.3* 8.4* 8.5*  MG 1.8 1.8  --  1.9  --  2.1  PHOS  --   --   --  4.5  --   --    Liver Function Tests: Recent Labs  Lab 09/08/22 0325 09/11/22 0659  AST 23 18  ALT 19 21  ALKPHOS 90 99  BILITOT 0.5 0.4  PROT 6.1* 5.8*  ALBUMIN 3.4* 3.2*   CBG: Recent Labs  Lab 09/12/22 1133 09/12/22 1631 09/12/22 2133 09/13/22 0728 09/13/22 1145  GLUCAP 137* 124* 114* 128* 86    Discharge time spent: greater than 30 minutes.  Signed: Vassie Loll, MD [facility] 09/13/2022

## 2022-09-13 NOTE — ED Triage Notes (Signed)
Pt bib EMS after she was found by a neighbor in the floor. Pt was recently discharged from hospital (today) for "breathing problems". EMS states that when they arrived, pt's O2 sats were 78% on her home O2 of 4L [place] so they placed her on 6L and sats improved to 99%. EMS reports pt had wheezes throughout so they administered one Duoneb en route. Per EMS, pt is non-compliant with medications (was supposed to take a neb tx at 200 but did not). Pt with productive cough.

## 2022-09-13 NOTE — Evaluation (Signed)
Physical Therapy Evaluation Patient Details Name: Joan [name] MRN: [id number] DOB: 11-04-1960 Today's Date: 09/13/2022  History of Present Illness  Joan [name] is a 62 y.o. female with medical history significant of terminal COPD on hospice care, hypertension, hyperlipidemia, T2DM, OSA on CPAP who presented to the emergency department due to worsening redness of her foot and shortness of breath.  Patient was admitted from 7/14 to 7/15 due to right lower extremity cellulitis with failed outpatient treatment, she requested to be discharged home, but on getting home, she endorsed worsening shortness of breath and worsening pain and redness of her right foot, so she decided to return to the ED for further evaluation and management.  She denies chest pain, fever, chills, nausea, vomiting, abdominal pain.   Clinical Impression  Pt tolerated treatment well. Able to independently perform transfers and ambulation in bathroom but utilizes RW when ambulating longer distances. Pt continues to tolerate sitting at EOB at the end of therapy. Patient will benefit from continued skilled physical therapy in hospital and recommended venue below to increase strength, balance, endurance for safe ADLs and gait.         Assistance Recommended at Discharge Set up Supervision/Assistance  If plan is discharge home, recommend the following:  Can travel by private vehicle  A little help with walking and/or transfers;A little help with bathing/dressing/bathroom;Assistance with cooking/housework        Equipment Recommendations Rolling walker (2 wheels)  Recommendations for Other Services       Functional Status Assessment Patient has had a recent decline in their functional status and demonstrates the ability to make significant improvements in function in a reasonable and predictable amount of time.     Precautions / Restrictions Precautions Precautions: Fall Restrictions Weight Bearing  Restrictions: No      Mobility  Bed Mobility Overal bed mobility: Independent             General bed mobility comments: Pt able to balance sufficiently at EOB    Transfers Overall transfer level: Independent Equipment used: Rolling walker (2 wheels)               General transfer comment: Pt uses RW to ambulate but does not use it to transfer    Ambulation/Gait Ambulation/Gait assistance: Modified independent (Device/Increase time) Gait Distance (Feet): 50 Feet Assistive device: Rolling walker (2 wheels) Gait Pattern/deviations: Step-through pattern, Decreased step length - right, Decreased step length - left, Decreased stride length Gait velocity: slowed     General Gait Details: RW, O2 monitored at 94% throughout ambulation  Stairs            Wheelchair Mobility     Tilt Bed    Modified Rankin (Stroke Patients Only)       Balance Overall balance assessment: Modified Independent, Independent                                           Pertinent Vitals/Pain Pain Assessment Pain Assessment: No/denies pain    Home Living Family/patient expects to be discharged to:: Private residence Living Arrangements: Alone Available Help at Discharge: Family;Available PRN/intermittently Type of Home: Apartment (Epic states house was lived in but pt states she lives in a "ground level apartment") Home Access: Level entry       Home Layout: One level Home Equipment: Rollator (4 wheels);Cane - single point;Shower seat;Wheelchair - Control and instrumentation engineer  Additional Comments: Home O2-3 liters, adjustable bed    Prior Function Prior Level of Function : Independent/Modified Independent               ADLs Comments: Pt stating that she completes all ADLs and iADLs independently with increased time due to increased WOB with activity     Hand Dominance        Extremity/Trunk Assessment                Communication    Communication: No difficulties  Cognition Arousal/Alertness: Awake/alert Behavior During Therapy: WFL for tasks assessed/performed Overall Cognitive Status: Within Functional Limits for tasks assessed                                          General Comments      Exercises     Assessment/Plan    PT Assessment Patient needs continued PT services  PT Problem List Decreased strength;Decreased activity tolerance;Decreased balance       PT Treatment Interventions DME instruction;Balance training;Gait training;Therapeutic activities;Therapeutic exercise;Stair training    PT Goals (Current goals can be found in the Care Plan section)  Acute Rehab PT Goals Patient Stated Goal: Return home with assist from family PT Goal Formulation: With patient Time For Goal Achievement: 09/26/22 Potential to Achieve Goals: Good    Frequency Min 3X/week     Co-evaluation               AM-PAC PT "6 Clicks" Mobility  Outcome Measure Help needed turning from your back to your side while in a flat bed without using bedrails?: None Help needed moving from lying on your back to sitting on the side of a flat bed without using bedrails?: None Help needed moving to and from a bed to a chair (including a wheelchair)?: None Help needed standing up from a chair using your arms (e.g., wheelchair or bedside chair)?: None Help needed to walk in hospital room?: A Little Help needed climbing 3-5 steps with a railing? : A Little 6 Click Score: 22    End of Session   Activity Tolerance: Patient tolerated treatment well;Patient limited by fatigue Patient left: with call bell/phone within reach;Other (comment) (EOB)   PT Visit Diagnosis: Unsteadiness on feet (R26.81);Other abnormalities of gait and mobility (R26.89);Muscle weakness (generalized) (M62.81)    Time: 7829-5621 PT Time Calculation (min) (ACUTE ONLY): 30 min   Charges:   PT Evaluation $PT Eval Moderate Complexity: 1  Mod PT Treatments $Therapeutic Activity: 23-37 mins PT General Charges $$ ACUTE PT VISIT: 1 Visit         Arvie Villarruel SPT [place], DPT Program

## 2022-09-13 NOTE — TOC Transition Note (Signed)
Transition of Care [facility]) - CM/SW Discharge Note   Patient Details  Name: Joan [name] MRN: [id number] Date of Birth: [birth year]  [facility] of [facility]) CM/SW Contact:  Villa Herb, LCSWA Phone Number: 09/13/2022, 10:56 AM   Clinical Narrative:    Pt is high risk for readmission. TOC has spoken with pt during this admission. Pt plans to return home with [facility] as previously established. No other needs identified at this time. TOC signing off.   Final next level of care: Home w Hospice Care Barriers to Discharge: Barriers Resolved   Patient Goals and CMS Choice      Discharge Placement                         Discharge Plan and Services Additional resources added to the After Visit Summary for                                       Social Determinants of Health (SDOH) Interventions SDOH Screenings   Food Insecurity: No Food Insecurity (09/11/2022)  Recent Concern: Food Insecurity - Food Insecurity Present (08/12/2022)  Housing: Low Risk  (09/11/2022)  Transportation Needs: No Transportation Needs (09/11/2022)  Recent Concern: Transportation Needs - Unmet Transportation Needs (08/30/2022)  Utilities: Not At Risk (09/11/2022)  Alcohol Screen: Low Risk  (08/30/2022)  Depression (PHQ2-9): Low Risk  (08/30/2022)  Financial Resource Strain: High Risk (08/30/2022)  Physical Activity: Inactive (08/30/2022)  Social Connections: Moderately Isolated (08/30/2022)  Stress: No Stress Concern Present (08/30/2022)  Recent Concern: Stress - Stress Concern Present (08/12/2022)  Tobacco Use: High Risk (09/12/2022)  Health Literacy: Low Risk  (02/18/2022)   Received from [facility]     Readmission Risk Interventions    09/13/2022   10:53 AM  Readmission Risk Prevention Plan  Transportation Screening Complete  HRI or Home Care Consult Complete  Social Work Consult for Recovery Care Planning/Counseling Complete  Palliative Care  Screening Not Applicable  Medication Review Oceanographer) Complete

## 2022-09-13 NOTE — TOC Benefit Eligibility Note (Signed)
Pharmacy Patient Advocate Encounter  Insurance verification completed.    The patient is insured through [facility] Medicare Part D  Ran test claim for linezolid (Zyvox) 600 mg tablets and the current 5 day co-pay is $48.76.   This test claim was processed through [facility]- copay amounts may vary at other pharmacies due to pharmacy/plan contracts, or as the patient moves through the different stages of their insurance plan.    Roland Earl, [name] Pharmacy Patient Advocate Specialist [facility] Patient Advocate Team Direct Number: [phone]  Fax: [phone]

## 2022-09-13 NOTE — Care Management Important Message (Signed)
Important Message  Patient Details  Name: Joan [name] MRN: [id number] Date of Birth: Apr 11, 1960   Medicare Important Message Given:  N/A - LOS <3 / Initial given by admissions     Corey Harold 09/13/2022, 2:14 PM

## 2022-09-13 NOTE — Progress Notes (Signed)
Patient sitting up eating breakfast, dangled off side of bed. Noted patient going backwards in bed, would wake up and eat a few bites then go backwards in bed. Charge nurse Wandra Mannan made aware. MD Gwenlyn Perking at bedside prior to assisting patient back in bed. MD Gwenlyn Perking stated patient having issues with CPAP and not sleeping at night. No new orders at this time. MD Gwenlyn Perking made aware that patient required assistance with ambulating. New order placed for PT eval.

## 2022-09-14 ENCOUNTER — Emergency Department (HOSPITAL_COMMUNITY)

## 2022-09-14 ENCOUNTER — Other Ambulatory Visit: Payer: Self-pay

## 2022-09-14 DIAGNOSIS — J439 Emphysema, unspecified: Secondary | ICD-10-CM | POA: Diagnosis not present

## 2022-09-14 DIAGNOSIS — S0990XA Unspecified injury of head, initial encounter: Secondary | ICD-10-CM | POA: Diagnosis not present

## 2022-09-14 DIAGNOSIS — R0602 Shortness of breath: Secondary | ICD-10-CM | POA: Diagnosis not present

## 2022-09-14 DIAGNOSIS — S199XXA Unspecified injury of neck, initial encounter: Secondary | ICD-10-CM | POA: Diagnosis not present

## 2022-09-14 MED ORDER — ACYCLOVIR 800 MG PO TABS
400.0000 mg | ORAL_TABLET | Freq: Two times a day (BID) | ORAL | Status: DC
  Administered 2022-09-14: 400 mg via ORAL
  Filled 2022-09-14: qty 1

## 2022-09-14 MED ORDER — IPRATROPIUM-ALBUTEROL 20-100 MCG/ACT IN AERS: 1.0000 | INHALATION_SPRAY | Freq: Four times a day (QID) | RESPIRATORY_TRACT | Status: DC | PRN

## 2022-09-14 MED ORDER — IPRATROPIUM-ALBUTEROL 0.5-2.5 (3) MG/3ML IN SOLN: 3.0000 mL | Freq: Four times a day (QID) | RESPIRATORY_TRACT | Status: DC | PRN

## 2022-09-14 MED ORDER — ATENOLOL 25 MG PO TABS
25.0000 mg | ORAL_TABLET | Freq: Every day | ORAL | Status: DC
  Administered 2022-09-14: 25 mg via ORAL
  Filled 2022-09-14: qty 1

## 2022-09-14 MED ORDER — PANTOPRAZOLE SODIUM 40 MG PO TBEC
40.0000 mg | DELAYED_RELEASE_TABLET | Freq: Every day | ORAL | Status: DC
  Administered 2022-09-14: 40 mg via ORAL
  Filled 2022-09-14: qty 1

## 2022-09-14 MED ORDER — MONTELUKAST SODIUM 10 MG PO TABS: 10.0000 mg | ORAL_TABLET | Freq: Every day | ORAL | Status: DC

## 2022-09-14 MED ORDER — PREGABALIN 75 MG PO CAPS
75.0000 mg | ORAL_CAPSULE | Freq: Two times a day (BID) | ORAL | Status: DC
  Administered 2022-09-14: 75 mg via ORAL
  Filled 2022-09-14: qty 1

## 2022-09-14 MED ORDER — UMECLIDINIUM BROMIDE 62.5 MCG/ACT IN AEPB
1.0000 | INHALATION_SPRAY | Freq: Every day | RESPIRATORY_TRACT | Status: DC
  Administered 2022-09-14: 1 via RESPIRATORY_TRACT
  Filled 2022-09-14: qty 7

## 2022-09-14 MED ORDER — BUDESON-GLYCOPYRROL-FORMOTEROL 160-9-4.8 MCG/ACT IN AERO: 2.0000 | INHALATION_SPRAY | Freq: Two times a day (BID) | RESPIRATORY_TRACT | Status: DC

## 2022-09-14 MED ORDER — HYDROCHLOROTHIAZIDE 25 MG PO TABS
25.0000 mg | ORAL_TABLET | Freq: Every day | ORAL | Status: DC
  Administered 2022-09-14: 25 mg via ORAL
  Filled 2022-09-14: qty 1

## 2022-09-14 MED ORDER — MELATONIN 3 MG PO TABS: 3.0000 mg | ORAL_TABLET | Freq: Every evening | ORAL | Status: DC | PRN

## 2022-09-14 MED ORDER — NAPROXEN 250 MG PO TABS
500.0000 mg | ORAL_TABLET | Freq: Two times a day (BID) | ORAL | Status: DC
  Administered 2022-09-14: 500 mg via ORAL
  Filled 2022-09-14: qty 2

## 2022-09-14 MED ORDER — LEVOTHYROXINE SODIUM 50 MCG PO TABS
25.0000 ug | ORAL_TABLET | Freq: Every day | ORAL | Status: DC
  Administered 2022-09-14: 25 ug via ORAL
  Filled 2022-09-14: qty 1

## 2022-09-14 MED ORDER — BUSPIRONE HCL 5 MG PO TABS: 5.0000 mg | ORAL_TABLET | Freq: Every day | ORAL | Status: DC

## 2022-09-14 MED ORDER — ALBUTEROL SULFATE (2.5 MG/3ML) 0.083% IN NEBU
2.5000 mg | INHALATION_SOLUTION | Freq: Two times a day (BID) | RESPIRATORY_TRACT | Status: DC
  Administered 2022-09-14: 2.5 mg via RESPIRATORY_TRACT
  Filled 2022-09-14: qty 3

## 2022-09-14 MED ORDER — POLYETHYLENE GLYCOL 3350 17 G PO PACK: 17.0000 g | PACK | Freq: Every day | ORAL | Status: DC | PRN

## 2022-09-14 MED ORDER — LINEZOLID 600 MG PO TABS
600.0000 mg | ORAL_TABLET | Freq: Two times a day (BID) | ORAL | Status: DC
  Administered 2022-09-14: 600 mg via ORAL
  Filled 2022-09-14 (×5): qty 1

## 2022-09-14 MED ORDER — BUPROPION HCL ER (XL) 150 MG PO TB24
300.0000 mg | ORAL_TABLET | Freq: Every day | ORAL | Status: DC
  Administered 2022-09-14: 300 mg via ORAL
  Filled 2022-09-14: qty 2

## 2022-09-14 MED ORDER — ROFLUMILAST 500 MCG PO TABS
500.0000 ug | ORAL_TABLET | Freq: Every day | ORAL | Status: DC
  Administered 2022-09-14: 500 ug via ORAL
  Filled 2022-09-14 (×3): qty 1

## 2022-09-14 MED ORDER — VENLAFAXINE HCL ER 37.5 MG PO CP24
150.0000 mg | ORAL_CAPSULE | Freq: Every day | ORAL | Status: DC
  Administered 2022-09-14: 150 mg via ORAL
  Filled 2022-09-14: qty 4

## 2022-09-14 MED ORDER — MOMETASONE FURO-FORMOTEROL FUM 200-5 MCG/ACT IN AERO
2.0000 | INHALATION_SPRAY | Freq: Two times a day (BID) | RESPIRATORY_TRACT | Status: DC
  Administered 2022-09-14: 2 via RESPIRATORY_TRACT
  Filled 2022-09-14: qty 8.8

## 2022-09-14 MED ORDER — TRAZODONE HCL 50 MG PO TABS: 100.0000 mg | ORAL_TABLET | Freq: Every day | ORAL | Status: DC

## 2022-09-14 MED ORDER — QUETIAPINE FUMARATE 100 MG PO TABS: 100.0000 mg | ORAL_TABLET | Freq: Every day | ORAL | Status: DC

## 2022-09-14 MED ORDER — ATORVASTATIN CALCIUM 10 MG PO TABS
20.0000 mg | ORAL_TABLET | Freq: Every day | ORAL | Status: DC
  Administered 2022-09-14: 20 mg via ORAL
  Filled 2022-09-14: qty 2

## 2022-09-14 NOTE — TOC Transition Note (Signed)
Transition of Care [facility]) - CM/SW Discharge Note   Patient Details  Name: Joan [name] MRN: [id number] Date of Birth: Nov 30, 1960  [facility] Care [facility]) CM/SW Contact:  Leitha Bleak, RN Phone Number: 09/14/2022, 1:13 PM   Clinical Narrative:   Patient returns to ED. TOC consulted to DME, HH. CM spoke with patient, she does not need any new equipment at this time. We discussed more help in the home. She states she does not have anyone to stay. We discussed [name] SW continue to work on placement for hospice. She states she does not want to give up her check. She wants to stop smoking. CM advised her to discuss with MD.  CM will update [facility] with this ED visit, so they can send nurse out to the home.  She is also active with their social work.  RN updated.    Final next level of care: Home w Hospice Care Barriers to Discharge: Barriers Resolved     Patient and family notified of of transfer: 09/14/22  Discharge Plan and Services Additional resources added to the After Visit Summary for      Social Determinants of Health (SDOH) Interventions SDOH Screenings   Food Insecurity: No Food Insecurity (09/11/2022)  Recent Concern: Food Insecurity - Food Insecurity Present (08/12/2022)  Housing: Low Risk  (09/11/2022)  Transportation Needs: No Transportation Needs (09/11/2022)  Recent Concern: Transportation Needs - Unmet Transportation Needs (08/30/2022)  Utilities: Not At Risk (09/11/2022)  Alcohol Screen: Low Risk  (08/30/2022)  Depression (PHQ2-9): Low Risk  (08/30/2022)  Financial Resource Strain: High Risk (08/30/2022)  Physical Activity: Inactive (08/30/2022)  Social Connections: Moderately Isolated (08/30/2022)  Stress: No Stress Concern Present (08/30/2022)  Recent Concern: Stress - Stress Concern Present (08/12/2022)  Tobacco Use: High Risk (09/13/2022)  Health Literacy: Low Risk  (02/18/2022)   Received from [facility]     Readmission Risk Interventions     09/13/2022   10:53 AM  Readmission Risk Prevention Plan  Transportation Screening Complete  HRI or Home Care Consult Complete  Social Work Consult for Recovery Care Planning/Counseling Complete  Palliative Care Screening Not Applicable  Medication Review Oceanographer) Complete

## 2022-09-14 NOTE — ED Provider Notes (Signed)
AP-EMERGENCY DEPT [facility] Emergency Department Provider Note MRN:  [id number]  Arrival date & time: 09/14/22     Chief Complaint   Fall   History of Present Illness   Joan [name] is a 62 y.o. year-old female with a history of COPD presenting to the ED with chief complaint of fall.  Patient unsure if she fell or what happened.  Explains she was trying to stand up with the walker and then the next thing she knows she is on the ground.  Thinks she hit the back of her head.  Might of had a headache before the fall.  Denies neck or back pain from the fall, no chest pain, baseline shortness of breath, no abdominal pain, no injuries to the arms or legs.  Review of Systems  A thorough review of systems was obtained and all systems are negative except as noted in the HPI and PMH.   Patient's Health History    Past Medical History:  Diagnosis Date   Acute respiratory failure with hypoxia (HCC)    Alcoholism and drug addiction in family    Anxiety    Appendicitis    Arthritis    "knees, hips, lower back" (09/25/2016)   Asthma    Bipolar disorder (HCC)    Chronic bronchitis (HCC)    "all the time" (09/25/2016)   Chronic leg pain    RLE   Chronic lower back pain    COPD (chronic obstructive pulmonary disease) (HCC)    Depression    Diabetes mellitus without complication (HCC)    Diabetic neuropathy (HCC)    Early cataracts, bilateral    Emphysema of lung (HCC)    Generalized anxiety disorder 02/09/2020   GERD (gastroesophageal reflux disease)    Headache    Hepatitis C    "treated back in 2001-2002"   High cholesterol    Hypertension    Hypothyroidism    Incarcerated ventral hernia 04/04/2017   Mood disorder in conditions classified elsewhere 02/09/2020   On home oxygen therapy    "2L; at nighttime" (04/04/2017)   OSA on CPAP    CPAP with Oxygen 3 liters   Paranoid (HCC)    Pneumonia    "all the time" (09/25/2016)   Positive PPD    with negative chest x ray    PTSD (post-traumatic stress disorder) 02/09/2020   Tachycardia    patient reports with exertion ; denies any other conditions    Thyroid disease    Wears glasses    Wears partial dentures     Past Surgical History:  Procedure Laterality Date   ANTERIOR CERVICAL DECOMP/DISCECTOMY FUSION N/A 02/14/2022   Procedure: Anterior Cervical Decompression/Discectomy Fusion Cervical Three-Cervical Four;  Surgeon: Bedelia Person, MD;  Location: [facility] OR;  Service: Neurosurgery;  Laterality: N/A;  3C   APPENDECTOMY     BACK SURGERY     BIOPSY  11/24/2018   Procedure: BIOPSY;  Surgeon: Tressia Danas, MD;  Location: [facility];  Service: Gastroenterology;;   CARDIAC CATHETERIZATION     COLONOSCOPY     COLONOSCOPY WITH PROPOFOL N/A 11/24/2018   Procedure: COLONOSCOPY WITH PROPOFOL;  Surgeon: Tressia Danas, MD;  Location: [facility];  Service: Gastroenterology;  Laterality: N/A;   CYST EXCISION     removal of cyst in sinuses   DILATION AND CURETTAGE OF UTERUS     ESOPHAGOGASTRODUODENOSCOPY (EGD) WITH PROPOFOL N/A 11/24/2018   Procedure: ESOPHAGOGASTRODUODENOSCOPY (EGD) WITH PROPOFOL;  Surgeon: Tressia Danas, MD;  Location: [facility];  Service: Gastroenterology;  Laterality: N/A;   FOOT SURGERY Bilateral    bone removed from 4th and 5 th toes and put in pin then took pin out   HERNIA REPAIR     INCISION AND DRAINAGE  ~ 2014/2015   "removed mesh & infection"   INCISIONAL HERNIA REPAIR N/A 04/04/2017   Procedure: LAPAROSCOPIC INCISIONAL HERNIA REPAIR WITH MESH;  Surgeon: Claud Kelp, MD;  Location: [facility] OR;  Service: General;  Laterality: N/A;   LACERATION REPAIR Right    repair wrist artery from laceration from ice skate   LAPAROSCOPIC APPENDECTOMY N/A 03/31/2019   Procedure: APPENDECTOMY LAPAROSCOPIC;  Surgeon: Axel Filler, MD;  Location: [facility];  Service: General;  Laterality: N/A;   LAPAROSCOPIC APPENDECTOMY N/A 07/09/2019   Procedure: APPENDECTOMY LAPAROSCOPIC;   Surgeon: Axel Filler, MD;  Location: [facility] OR;  Service: General;  Laterality: N/A;   LAPAROSCOPIC INCISIONAL / UMBILICAL / VENTRAL HERNIA REPAIR  04/04/2017   LAPAROSCOPIC INCISIONAL HERNIA REPAIR WITH MESH   LIPOMA EXCISION Right    fattty lipoma in neck   NASAL SEPTUM SURGERY     POSTERIOR LUMBAR FUSION  2015/2016 X 2   "added rods and screws"   SINOSCOPY     TOTAL HIP ARTHROPLASTY Right 02/05/2017   Procedure: RIGHT TOTAL HIP ARTHROPLASTY;  Surgeon: Ranee Gosselin, MD;  Location: [facility];  Service: Orthopedics;  Laterality: Right;   TOTAL HIP ARTHROPLASTY Left 06/18/2017   Procedure: LEFT TOTAL HIP ARTHROPLASTY;  Surgeon: Ranee Gosselin, MD;  Location: [facility];  Service: Orthopedics;  Laterality: Left;   TOTAL HIP REVISION Left 01/26/2019   Procedure: TOTAL HIP REVISION;  Surgeon: Durene Romans, MD;  Location: [facility];  Service: Orthopedics;  Laterality: Left;  2 hrs   TUBAL LIGATION     UMBILICAL HERNIA REPAIR  ~ 2013   w/mesh   UPPER GI ENDOSCOPY      Family History  Problem Relation Age of Onset   Diabetes Mother    Hypertension Mother    Hypertension Father    Cancer Father        lung   Breast cancer Neg Hx     Social History   Socioeconomic History   Marital status: Widowed    Spouse name: Not on file   Number of children: 3   Years of education: Not on file   Highest education level: 9th grade  Occupational History   Not on file  Tobacco Use   Smoking status: Every Day    Current packs/day: 2.00    Average packs/day: 2.0 packs/day for 47.5 years (95.1 ttl pk-yrs)    Types: Cigarettes    Start date: 1977    Passive exposure: Current   Smokeless tobacco: Never   Tobacco comments:    1ppd as of 07/30/22 ep  Vaping Use   Vaping status: Never Used  Substance and Sexual Activity   Alcohol use: Not Currently   Drug use: Not Currently    Types: "Crack" cocaine    Comment:  "nothing since 01/12/2012"   Sexual activity: Not Currently    Partners: Male  Other  Topics Concern   Not on file  Social History Narrative   Not on file   Social Determinants of Health   Financial Resource Strain: High Risk (08/30/2022)   Overall Financial Resource Strain (CARDIA)    Difficulty of Paying Living Expenses: Hard  Food Insecurity: No Food Insecurity (09/11/2022)   Hunger Vital Sign    Worried About Running Out of Food  in the Last Year: Never true    Ran Out of Food in the Last Year: Never true  Recent Concern: Food Insecurity - Food Insecurity Present (08/12/2022)   Hunger Vital Sign    Worried About Running Out of Food in the Last Year: Sometimes true    Ran Out of Food in the Last Year: Sometimes true  Transportation Needs: No Transportation Needs (09/11/2022)   PRAPARE - Administrator, Civil Service (Medical): No    Lack of Transportation (Non-Medical): No  Recent Concern: Transportation Needs - Unmet Transportation Needs (08/30/2022)   PRAPARE - Transportation    Lack of Transportation (Medical): Yes    Lack of Transportation (Non-Medical): Yes  Physical Activity: Inactive (08/30/2022)   Exercise Vital Sign    Days of Exercise per Week: 0 days    Minutes of Exercise per Session: 0 min  Stress: No Stress Concern Present (08/30/2022)   [employer] of Occupational Health - Occupational Stress Questionnaire    Feeling of Stress : Only a little  Recent Concern: Stress - Stress Concern Present (08/12/2022)   [employer] of Occupational Health - Occupational Stress Questionnaire    Feeling of Stress : Very much  Social Connections: Moderately Isolated (08/30/2022)   Social Connection and Isolation Panel [NHANES]    Frequency of Communication with Friends and Family: Once a week    Frequency of Social Gatherings with Friends and Family: Once a week    Attends Religious Services: 1 to 4 times per year    Active Member of [employer] or Organizations: Yes    Attends Banker Meetings: 1 to 4 times per year    Marital Status: Widowed   Intimate Partner Violence: Not At Risk (09/11/2022)   Humiliation, Afraid, Rape, and Kick questionnaire    Fear of Current or Ex-Partner: No    Emotionally Abused: No    Physically Abused: No    Sexually Abused: No     Physical Exam   Vitals:   09/14/22 0345 09/14/22 0400  BP: 128/65 130/68  Pulse: 79 80  Resp: 18 17  Temp:    SpO2: 100% 100%    CONSTITUTIONAL: Chronically ill-appearing, NAD NEURO/PSYCH:  Alert and oriented x 3, no focal deficits EYES:  eyes equal and reactive ENT/NECK:  no LAD, no JVD CARDIO: Regular rate, well-perfused, normal S1 and S2 PULM: Scattered wheezes GI/GU:  non-distended, non-tender MSK/SPINE:  No gross deformities, no edema SKIN:  no rash, atraumatic   *Additional and/or pertinent findings included in MDM below  Diagnostic and Interventional Summary    EKG Interpretation Date/Time:  Saturday September 14 2022 00:38:28 EDT Ventricular Rate:  72 PR Interval:  165 QRS Duration:  98 QT Interval:  390 QTC Calculation: 427 R Axis:   98  Text Interpretation: Sinus rhythm Right axis deviation Low voltage, precordial leads When compared with ECG of 09/10/2022, No significant change was found Confirmed by Dione Booze ([id number]) on 09/14/2022 5:26:01 AM       Labs Reviewed - No data to display  CT HEAD WO CONTRAST ( )  Final Result    CT CERVICAL SPINE WO CONTRAST  Final Result    DG Chest 2 View  Final Result      Medications  albuterol (VENTOLIN HFA) 108 (90 Base) MCG/ACT inhaler 2 puff (has no administration in time range)     Procedures  /  Critical Care Procedures  ED Course and Medical Decision Making  Initial Impression and Ddx Fall,  favored mechanical in the setting of poor ambulatory status as well as end-stage COPD on 4 L.  Reportedly on hospice for this end-stage COPD however she says she has no in-home assistance, afraid of being alone.  Potentially a component of a social visit here in the emergency department.  Plan is for  evaluation with EKG, chest x-ray, CT head, will consult TOC in the morning.  Past medical/surgical history that increases complexity of ED encounter: End-stage COPD  Interpretation of Diagnostics I personally reviewed the EKG and my interpretation is as follows: Sinus rhythm  Chest x-ray unchanged, CT head unremarkable, CT cervical spine commenting on possible osteophyte fracture however patient has no neck pain or decreased range of motion and so does not clinically correlate  Patient Reassessment and Ultimate Disposition/Management     Informed by nursing that patient is acting sleepier than she did, there is strong suspicion that patient took some form of pain medication without telling us.  Awaiting TOC evaluation.  Signed out to default provider.  Patient management required discussion with the following services or consulting groups:  None  Complexity of Problems Addressed Acute illness or injury that poses threat of life of bodily function  Additional Data Reviewed and Analyzed Further history obtained from: Further history from spouse/family member  Additional Factors Impacting ED Encounter Risk None  Elmer Sow. Pilar Plate, MD [facility] Emergency Medicine [facility] [email or web address]  Final Clinical Impressions(s) / ED Diagnoses     ICD-10-CM   1. Fall, initial encounter  W19.[facility]       ED Discharge Orders     None        Discharge Instructions Discussed with and Provided to Patient:   Discharge Instructions   None      Sabas Sous, MD 09/14/22 [phone]

## 2022-09-14 NOTE — ED Provider Notes (Addendum)
Patient seen by Dr. Orland Dec overnight.  She presented after a fall at home.  She had a recent hospitalization and was discharged yesterday.  Patient has been medically cleared but will remain in the ED awaiting TOC consult. Physical Exam  BP 130/68   Pulse 80   Temp 98.2 F (36.8 C) (Oral)   Resp 17   Ht 5\' 8"  (1.727 m)   Wt 90.2 kg   SpO2 99%   BMI 30.24 kg/m   Physical Exam Vitals and nursing note reviewed.  Constitutional:      General: She is not in acute distress.    Appearance: Normal appearance. She is well-developed. She is not ill-appearing, toxic-appearing or diaphoretic.  HENT:     Head: Normocephalic and atraumatic.     Right Ear: External ear normal.     Left Ear: External ear normal.     Nose: Nose normal.     Mouth/Throat:     Mouth: Mucous membranes are moist.     Pharynx: Oropharynx is clear.  Eyes:     Extraocular Movements: Extraocular movements intact.     Conjunctiva/sclera: Conjunctivae normal.  Cardiovascular:     Rate and Rhythm: Normal rate and regular rhythm.  Pulmonary:     Effort: Pulmonary effort is normal. No respiratory distress.     Breath sounds: No wheezing or rales.     Comments: SpO2 100 percent on 4 L Abdominal:     General: There is no distension.     Palpations: Abdomen is soft.  Musculoskeletal:        General: No swelling. Normal range of motion.     Cervical back: Normal range of motion and neck supple.  Skin:    General: Skin is warm and dry.     Coloration: Skin is not jaundiced or pale.  Neurological:     General: No focal deficit present.     Mental Status: She is alert and oriented to person, place, and time.  Psychiatric:        Mood and Affect: Mood normal.        Behavior: Behavior normal.     Procedures  Procedures  ED Course / MDM    Medical Decision Making Amount and/or Complexity of Data Reviewed Radiology: ordered.  Risk OTC drugs. Prescription drug management.   On initial assessment, patient was  sleeping off of bedside cardiac monitor.  Given the report of fall at home and hypoxia, she was placed back on monitor.  SpO2 remained 100% on her baseline 4 L.  Patient does not have increased work of breathing.  She denies any current shortness of breath.  When asked about what happened last night, patient is amnestic to the details.  She states that she has had a difficult time walking.  She is unaware of what home services were set up at time of her discharge yesterday.  TOC consulted and stated that patient does not need any new equipment at this time.  Social work to [employer] so they can send nurse out to home.  Patient was able to walk unassisted to the bathroom while in the ED.  Given support services already in place, patient to be discharged.       Gloris Manchester, MD 09/14/22 1432    Gloris Manchester, MD 09/14/22 [phone]

## 2022-09-14 NOTE — Discharge Instructions (Signed)
[facility] services should be reaching out to arrange for increased support at home.  Return to the emergency department for any new or worsening symptoms of concern.

## 2022-09-14 NOTE — ED Notes (Signed)
Spoke with sister updates given

## 2022-09-16 ENCOUNTER — Encounter (HOSPITAL_COMMUNITY): Payer: Self-pay

## 2022-09-16 ENCOUNTER — Telehealth (HOSPITAL_COMMUNITY): Payer: Medicare Other | Admitting: Psychiatry

## 2022-09-16 DIAGNOSIS — G4733 Obstructive sleep apnea (adult) (pediatric): Secondary | ICD-10-CM | POA: Diagnosis not present

## 2022-09-16 DIAGNOSIS — J441 Chronic obstructive pulmonary disease with (acute) exacerbation: Secondary | ICD-10-CM | POA: Diagnosis not present

## 2022-09-16 DIAGNOSIS — J449 Chronic obstructive pulmonary disease, unspecified: Secondary | ICD-10-CM | POA: Diagnosis not present

## 2022-09-17 ENCOUNTER — Other Ambulatory Visit: Payer: Self-pay | Admitting: Internal Medicine

## 2022-09-17 ENCOUNTER — Telehealth: Payer: Self-pay | Admitting: *Deleted

## 2022-09-17 NOTE — Telephone Encounter (Signed)
Transition Care Management Follow-up Telephone Call Date of discharge and from where: [name] ed7/20/2024 How have you been since you were released from the hospital? feeling ok but feet are pretty swollen and have that blister  Any questions or concerns? No  Items Reviewed: Did the pt receive and understand the discharge instructions provided? No  Medications obtained and verified? No  Other? No  Any new allergies since your discharge? No  Dietary orders reviewed? No Do you have support at home? Yes     Follow up appointments reviewed:  PCP Hospital f/u appt confirmed? Yes  will schedule   Are transportation arrangements needed? No  If their condition worsens, is the pt aware to call PCP or go to the Emergency Dept.? Yes Was the patient provided with contact information for the PCP's office or ED? Yes Was to pt encouraged to call back with questions or concerns? Yes

## 2022-09-19 ENCOUNTER — Telehealth: Payer: Self-pay

## 2022-09-19 DIAGNOSIS — L03115 Cellulitis of right lower limb: Secondary | ICD-10-CM

## 2022-09-20 ENCOUNTER — Telehealth: Payer: Self-pay | Admitting: *Deleted

## 2022-09-20 NOTE — Progress Notes (Unsigned)
  Care Coordination  Outreach Note  09/20/2022 Name: Joan [name] MRN: [id number] DOB: 23-Sep-1960   Care Coordination Outreach Attempts: An unsuccessful telephone outreach was attempted today to offer the patient information about available care coordination services.  Follow Up Plan:  Additional outreach attempts will be made to offer the patient care coordination information and services.   Encounter Outcome:  No Answer  Burman Nieves, CCMA Care Coordination Care Guide Direct Dial: [phone]

## 2022-09-23 ENCOUNTER — Telehealth: Payer: Self-pay | Admitting: Internal Medicine

## 2022-09-23 ENCOUNTER — Telehealth (HOSPITAL_COMMUNITY): Payer: Medicare Other | Admitting: Psychiatry

## 2022-09-23 NOTE — Progress Notes (Signed)
  Care Coordination   Note   09/23/2022 Name: Joan [name] MRN: [id number] DOB: 04/26/1960  Joan [name] is a 62 y.o. year old female who sees Myrlene Broker, MD for primary care. I reached out to [employer] by phone today to offer care coordination services.  [name] was given information about Care Coordination services today including:   The Care Coordination services include support from the care team which includes your Nurse Coordinator, Clinical Social Worker, or Pharmacist.  The Care Coordination team is here to help remove barriers to the health concerns and goals most important to you. Care Coordination services are voluntary, and the patient may decline or stop services at any time by request to their care team member.   Care Coordination Consent Status: Patient agreed to services and verbal consent obtained.   Follow up plan:  Telephone appointment with care coordination team member scheduled for:  09/25/2022  Encounter Outcome:  Pt. Scheduled from referral   Burman Nieves, [facility] Care Coordination Care Guide Direct Dial: [phone]

## 2022-09-23 NOTE — Telephone Encounter (Signed)
Pt called stating she want to be prescribe something to stop smoking cigarettes. Pt stated she would ,like a call back from a nurse with update. Please advise.

## 2022-09-24 NOTE — Telephone Encounter (Signed)
Please advise for patient as there provider is out of office

## 2022-09-24 NOTE — Telephone Encounter (Signed)
She has an upcoming appt with Dr Okey Dupre in August - would recommend she discuss this at that time

## 2022-09-25 ENCOUNTER — Ambulatory Visit: Payer: Self-pay

## 2022-09-25 NOTE — Patient Outreach (Signed)
  Care Coordination   09/25/2022 Name: Joan [name] MRN: [id number] DOB: 1960-03-10   Care Coordination Outreach Attempts:  Successful contact made with patient.  Patient unable to talk with RNCM at time of call. Patient requested return call at another time.    Follow Up Plan:  Additional outreach attempts will be made to offer the patient care coordination information and services.   Encounter Outcome:  Pt. Request to Call Back   Care Coordination Interventions:  No, not indicated    George Ina [facility] Care Coordination [phone] direct line

## 2022-09-25 NOTE — Telephone Encounter (Signed)
Called pt no answer LMOM w/MD response../lmb 

## 2022-09-26 ENCOUNTER — Telehealth: Payer: Self-pay

## 2022-09-26 ENCOUNTER — Ambulatory Visit: Payer: Self-pay

## 2022-09-26 NOTE — Patient Outreach (Signed)
  Care Coordination   09/26/2022 Name: Joan [name] MRN: [id number] DOB: May 08, 1960   Care Coordination Outreach Attempts:  Successful contact made with patient.  Patient states she has Hospice. She states she was able to get her questions answered.  She states she has a Engineer, civil (consulting) and aide 3 x per week. Patient reports having the services she needs.   Follow Up Plan:  No further outreach attempts will be made at this time. We have been unable to contact the patient to offer or enroll patient in care coordination services  Encounter Outcome:  Pt. Visit Completed   Care Coordination Interventions:  No, not indicated    George Ina [facility] Care Coordination [phone] direct line

## 2022-09-26 NOTE — Patient Outreach (Signed)
  Care Coordination   09/26/2022 Name: Joan [name] MRN: [id number] DOB: 1960/06/07   Care Coordination Outreach Attempts:  An unsuccessful telephone outreach was attempted for a scheduled appointment today. HIPAA compliant message left with call back phone number.  Follow Up Plan:  Additional outreach attempts will be made to offer the patient care coordination information and services.   Encounter Outcome:  No Answer   Care Coordination Interventions:  No, not indicated    George Ina [facility] Care Coordination [phone] direct line

## 2022-10-02 ENCOUNTER — Other Ambulatory Visit: Payer: Self-pay | Admitting: Internal Medicine

## 2022-10-04 ENCOUNTER — Telehealth: Payer: Self-pay | Admitting: Internal Medicine

## 2022-10-04 NOTE — Telephone Encounter (Signed)
Patient called and said that a fax for her wheel chair should have been sent today.  Patient is very concerned and wants someone to call her as soon as possible.  [phone]

## 2022-10-08 ENCOUNTER — Inpatient Hospital Stay: Payer: Medicare Other | Admitting: Internal Medicine

## 2022-10-09 ENCOUNTER — Encounter (HOSPITAL_COMMUNITY): Payer: Self-pay | Admitting: Internal Medicine

## 2022-10-09 ENCOUNTER — Other Ambulatory Visit: Payer: Self-pay

## 2022-10-09 ENCOUNTER — Ambulatory Visit (INDEPENDENT_AMBULATORY_CARE_PROVIDER_SITE_OTHER): Payer: Medicare Other

## 2022-10-09 ENCOUNTER — Emergency Department (HOSPITAL_COMMUNITY)

## 2022-10-09 ENCOUNTER — Inpatient Hospital Stay (HOSPITAL_COMMUNITY)
Admission: EM | Admit: 2022-10-09 | Discharge: 2022-10-12 | DRG: 191 | Disposition: A | Discharge status: Hospice | Attending: Family Medicine | Admitting: Family Medicine

## 2022-10-09 ENCOUNTER — Ambulatory Visit: Payer: Medicare Other | Admitting: Podiatry

## 2022-10-09 DIAGNOSIS — Z7189 Other specified counseling: Secondary | ICD-10-CM

## 2022-10-09 DIAGNOSIS — Z811 Family history of alcohol abuse and dependence: Secondary | ICD-10-CM

## 2022-10-09 DIAGNOSIS — S91011A Laceration without foreign body, right ankle, initial encounter: Secondary | ICD-10-CM | POA: Diagnosis not present

## 2022-10-09 DIAGNOSIS — F17210 Nicotine dependence, cigarettes, uncomplicated: Secondary | ICD-10-CM | POA: Diagnosis present

## 2022-10-09 DIAGNOSIS — R0600 Dyspnea, unspecified: Secondary | ICD-10-CM | POA: Diagnosis not present

## 2022-10-09 DIAGNOSIS — L97519 Non-pressure chronic ulcer of other part of right foot with unspecified severity: Secondary | ICD-10-CM | POA: Diagnosis present

## 2022-10-09 DIAGNOSIS — Z8249 Family history of ischemic heart disease and other diseases of the circulatory system: Secondary | ICD-10-CM

## 2022-10-09 DIAGNOSIS — F4310 Post-traumatic stress disorder, unspecified: Secondary | ICD-10-CM | POA: Diagnosis present

## 2022-10-09 DIAGNOSIS — Z981 Arthrodesis status: Secondary | ICD-10-CM

## 2022-10-09 DIAGNOSIS — L03031 Cellulitis of right toe: Secondary | ICD-10-CM

## 2022-10-09 DIAGNOSIS — E7800 Pure hypercholesterolemia, unspecified: Secondary | ICD-10-CM | POA: Diagnosis present

## 2022-10-09 DIAGNOSIS — Z881 Allergy status to other antibiotic agents status: Secondary | ICD-10-CM

## 2022-10-09 DIAGNOSIS — Z885 Allergy status to narcotic agent status: Secondary | ICD-10-CM

## 2022-10-09 DIAGNOSIS — J441 Chronic obstructive pulmonary disease with (acute) exacerbation: Principal | ICD-10-CM | POA: Diagnosis present

## 2022-10-09 DIAGNOSIS — Z91013 Allergy to seafood: Secondary | ICD-10-CM

## 2022-10-09 DIAGNOSIS — E11621 Type 2 diabetes mellitus with foot ulcer: Secondary | ICD-10-CM | POA: Diagnosis present

## 2022-10-09 DIAGNOSIS — L02611 Cutaneous abscess of right foot: Secondary | ICD-10-CM | POA: Diagnosis not present

## 2022-10-09 DIAGNOSIS — E1140 Type 2 diabetes mellitus with diabetic neuropathy, unspecified: Secondary | ICD-10-CM

## 2022-10-09 DIAGNOSIS — E876 Hypokalemia: Secondary | ICD-10-CM | POA: Diagnosis present

## 2022-10-09 DIAGNOSIS — Z79899 Other long term (current) drug therapy: Secondary | ICD-10-CM

## 2022-10-09 DIAGNOSIS — Z79890 Hormone replacement therapy (postmenopausal): Secondary | ICD-10-CM

## 2022-10-09 DIAGNOSIS — F411 Generalized anxiety disorder: Secondary | ICD-10-CM | POA: Diagnosis present

## 2022-10-09 DIAGNOSIS — E039 Hypothyroidism, unspecified: Secondary | ICD-10-CM | POA: Diagnosis present

## 2022-10-09 DIAGNOSIS — J439 Emphysema, unspecified: Secondary | ICD-10-CM | POA: Diagnosis present

## 2022-10-09 DIAGNOSIS — J9611 Chronic respiratory failure with hypoxia: Secondary | ICD-10-CM | POA: Diagnosis not present

## 2022-10-09 DIAGNOSIS — Z833 Family history of diabetes mellitus: Secondary | ICD-10-CM

## 2022-10-09 DIAGNOSIS — I959 Hypotension, unspecified: Secondary | ICD-10-CM | POA: Diagnosis not present

## 2022-10-09 DIAGNOSIS — Z7951 Long term (current) use of inhaled steroids: Secondary | ICD-10-CM

## 2022-10-09 DIAGNOSIS — K219 Gastro-esophageal reflux disease without esophagitis: Secondary | ICD-10-CM | POA: Diagnosis present

## 2022-10-09 DIAGNOSIS — Z9981 Dependence on supplemental oxygen: Secondary | ICD-10-CM

## 2022-10-09 DIAGNOSIS — F319 Bipolar disorder, unspecified: Secondary | ICD-10-CM | POA: Diagnosis present

## 2022-10-09 DIAGNOSIS — L97512 Non-pressure chronic ulcer of other part of right foot with fat layer exposed: Secondary | ICD-10-CM

## 2022-10-09 DIAGNOSIS — Z5986 Financial insecurity: Secondary | ICD-10-CM

## 2022-10-09 DIAGNOSIS — E0843 Diabetes mellitus due to underlying condition with diabetic autonomic (poly)neuropathy: Secondary | ICD-10-CM | POA: Diagnosis not present

## 2022-10-09 DIAGNOSIS — R110 Nausea: Secondary | ICD-10-CM | POA: Diagnosis not present

## 2022-10-09 DIAGNOSIS — Z1152 Encounter for screening for COVID-19: Secondary | ICD-10-CM

## 2022-10-09 DIAGNOSIS — G4733 Obstructive sleep apnea (adult) (pediatric): Secondary | ICD-10-CM

## 2022-10-09 DIAGNOSIS — R0689 Other abnormalities of breathing: Secondary | ICD-10-CM | POA: Diagnosis not present

## 2022-10-09 DIAGNOSIS — Z96643 Presence of artificial hip joint, bilateral: Secondary | ICD-10-CM | POA: Diagnosis present

## 2022-10-09 DIAGNOSIS — Z555 Less than a high school diploma: Secondary | ICD-10-CM

## 2022-10-09 DIAGNOSIS — Z813 Family history of other psychoactive substance abuse and dependence: Secondary | ICD-10-CM

## 2022-10-09 DIAGNOSIS — Z515 Encounter for palliative care: Secondary | ICD-10-CM

## 2022-10-09 DIAGNOSIS — I10 Essential (primary) hypertension: Secondary | ICD-10-CM

## 2022-10-09 DIAGNOSIS — Z5982 Transportation insecurity: Secondary | ICD-10-CM

## 2022-10-09 DIAGNOSIS — F0393 Unspecified dementia, unspecified severity, with mood disturbance: Secondary | ICD-10-CM | POA: Diagnosis present

## 2022-10-09 DIAGNOSIS — E871 Hypo-osmolality and hyponatremia: Secondary | ICD-10-CM | POA: Diagnosis present

## 2022-10-09 DIAGNOSIS — Z888 Allergy status to other drugs, medicaments and biological substances status: Secondary | ICD-10-CM

## 2022-10-09 DIAGNOSIS — R1013 Epigastric pain: Secondary | ICD-10-CM | POA: Diagnosis not present

## 2022-10-09 LAB — CBC WITH DIFFERENTIAL/PLATELET
Abs Immature Granulocytes: 0.03 10*3/uL (ref 0.00–0.07)
Basophils Absolute: 0.1 10*3/uL (ref 0.0–0.1)
Basophils Relative: 1 %
Eosinophils Absolute: 0.2 10*3/uL (ref 0.0–0.5)
Eosinophils Relative: 2 %
HCT: 32.6 % — ABNORMAL LOW (ref 36.0–46.0)
Hemoglobin: 11 g/dL — ABNORMAL LOW (ref 12.0–15.0)
Immature Granulocytes: 0 %
Lymphocytes Relative: 19 %
Lymphs Abs: 1.7 10*3/uL (ref 0.7–4.0)
MCH: 29.1 pg (ref 26.0–34.0)
MCHC: 33.7 g/dL (ref 30.0–36.0)
MCV: 86.2 fL (ref 80.0–100.0)
Monocytes Absolute: 0.6 10*3/uL (ref 0.1–1.0)
Monocytes Relative: 6 %
Neutro Abs: 6.3 10*3/uL (ref 1.7–7.7)
Neutrophils Relative %: 72 %
Platelets: 298 10*3/uL (ref 150–400)
RBC: 3.78 MIL/uL — ABNORMAL LOW (ref 3.87–5.11)
RDW: 15.4 % (ref 11.5–15.5)
WBC: 8.8 10*3/uL (ref 4.0–10.5)
nRBC: 0 % (ref 0.0–0.2)

## 2022-10-09 LAB — GLUCOSE, CAPILLARY: Glucose-Capillary: 94 mg/dL (ref 70–99)

## 2022-10-09 LAB — BASIC METABOLIC PANEL
Anion gap: 10 (ref 5–15)
BUN: 7 mg/dL (ref 6–20)
CO2: 34 mmol/L — ABNORMAL HIGH (ref 22–32)
Calcium: 9 mg/dL (ref 8.9–10.3)
Chloride: 83 mmol/L — ABNORMAL LOW (ref 98–111)
Creatinine, Ser: 0.76 mg/dL (ref 0.44–1.00)
GFR, Estimated: >60 mL/min (ref >60)
Glucose, Bld: 127 mg/dL — ABNORMAL HIGH (ref 70–99)
Potassium: 2.6 mmol/L — CL (ref 3.5–5.1)
Sodium: 127 mmol/L — ABNORMAL LOW (ref 135–145)

## 2022-10-09 LAB — I-STAT CG4 LACTIC ACID, ED
Lactic Acid, Venous: 1.3 mmol/L (ref 0.5–1.9)
Lactic Acid, Venous: 1.3 mmol/L (ref 0.5–1.9)

## 2022-10-09 LAB — SARS CORONAVIRUS 2 BY RT PCR: SARS Coronavirus 2 by RT PCR: NEGATIVE

## 2022-10-09 MED ORDER — BUDESON-GLYCOPYRROL-FORMOTEROL 160-9-4.8 MCG/ACT IN AERO: 2.0000 | INHALATION_SPRAY | Freq: Two times a day (BID) | RESPIRATORY_TRACT | Status: DC

## 2022-10-09 MED ORDER — ROFLUMILAST 500 MCG PO TABS
500.0000 ug | ORAL_TABLET | Freq: Every day | ORAL | Status: DC
  Administered 2022-10-11 – 2022-10-12 (×2): 500 ug via ORAL
  Filled 2022-10-09 (×2): qty 1

## 2022-10-09 MED ORDER — DIPHENHYDRAMINE HCL 25 MG PO CAPS
25.0000 mg | ORAL_CAPSULE | Freq: Two times a day (BID) | ORAL | Status: DC
  Administered 2022-10-11 – 2022-10-12 (×3): 25 mg via ORAL
  Filled 2022-10-09 (×3): qty 1

## 2022-10-09 MED ORDER — LEVOFLOXACIN IN D5W 750 MG/150ML IV SOLN
750.0000 mg | Freq: Once | INTRAVENOUS | Status: AC
  Administered 2022-10-09: 750 mg via INTRAVENOUS
  Filled 2022-10-09: qty 150

## 2022-10-09 MED ORDER — ONDANSETRON HCL 4 MG/2ML IJ SOLN
INTRAMUSCULAR | Status: AC
  Filled 2022-10-09: qty 2

## 2022-10-09 MED ORDER — ENOXAPARIN SODIUM 40 MG/0.4ML IJ SOSY
40.0000 mg | PREFILLED_SYRINGE | INTRAMUSCULAR | Status: DC
  Administered 2022-10-10 – 2022-10-12 (×3): 40 mg via SUBCUTANEOUS
  Filled 2022-10-09 (×3): qty 0.4

## 2022-10-09 MED ORDER — IPRATROPIUM-ALBUTEROL 20-100 MCG/ACT IN AERS: 1.0000 | INHALATION_SPRAY | Freq: Four times a day (QID) | RESPIRATORY_TRACT | Status: DC | PRN

## 2022-10-09 MED ORDER — SILVER SULFADIAZINE 1 % EX CREA
1.0000 | TOPICAL_CREAM | Freq: Every day | CUTANEOUS | Status: DC
  Administered 2022-10-10 – 2022-10-12 (×3): 1 via TOPICAL
  Filled 2022-10-09: qty 50

## 2022-10-09 MED ORDER — VENLAFAXINE HCL ER 150 MG PO CP24
150.0000 mg | ORAL_CAPSULE | Freq: Every day | ORAL | Status: DC
  Administered 2022-10-11 – 2022-10-12 (×2): 150 mg via ORAL
  Filled 2022-10-09 (×2): qty 1

## 2022-10-09 MED ORDER — UMECLIDINIUM BROMIDE 62.5 MCG/ACT IN AEPB
1.0000 | INHALATION_SPRAY | Freq: Every day | RESPIRATORY_TRACT | Status: DC
  Administered 2022-10-10 – 2022-10-12 (×3): 1 via RESPIRATORY_TRACT
  Filled 2022-10-09: qty 7

## 2022-10-09 MED ORDER — INSULIN ASPART 100 UNIT/ML IJ SOLN
0.0000 [IU] | Freq: Every day | INTRAMUSCULAR | Status: DC
  Filled 2022-10-09: qty 0.05

## 2022-10-09 MED ORDER — POTASSIUM CHLORIDE CRYS ER 20 MEQ PO TBCR
60.0000 meq | EXTENDED_RELEASE_TABLET | Freq: Once | ORAL | Status: AC
  Administered 2022-10-09: 60 meq via ORAL
  Filled 2022-10-09: qty 3

## 2022-10-09 MED ORDER — POTASSIUM CHLORIDE 10 MEQ/100ML IV SOLN
10.0000 meq | INTRAVENOUS | Status: AC
  Administered 2022-10-09 (×2): 10 meq via INTRAVENOUS
  Filled 2022-10-09 (×3): qty 100

## 2022-10-09 MED ORDER — PREGABALIN 75 MG PO CAPS
300.0000 mg | ORAL_CAPSULE | Freq: Two times a day (BID) | ORAL | Status: DC
  Administered 2022-10-10 – 2022-10-12 (×6): 300 mg via ORAL
  Filled 2022-10-09 (×6): qty 4

## 2022-10-09 MED ORDER — MELATONIN 5 MG PO TABS
5.0000 mg | ORAL_TABLET | Freq: Every evening | ORAL | Status: DC | PRN
  Administered 2022-10-10: 5 mg via ORAL
  Filled 2022-10-09: qty 1

## 2022-10-09 MED ORDER — PANTOPRAZOLE SODIUM 40 MG PO TBEC
40.0000 mg | DELAYED_RELEASE_TABLET | Freq: Every day | ORAL | Status: DC
  Administered 2022-10-10 – 2022-10-12 (×3): 40 mg via ORAL
  Filled 2022-10-09 (×3): qty 1

## 2022-10-09 MED ORDER — MONTELUKAST SODIUM 10 MG PO TABS
10.0000 mg | ORAL_TABLET | Freq: Every day | ORAL | Status: DC
  Administered 2022-10-10 – 2022-10-11 (×3): 10 mg via ORAL
  Filled 2022-10-09 (×3): qty 1

## 2022-10-09 MED ORDER — INSULIN ASPART 100 UNIT/ML IJ SOLN
0.0000 [IU] | Freq: Three times a day (TID) | INTRAMUSCULAR | Status: DC
  Administered 2022-10-10: 3 [IU] via SUBCUTANEOUS
  Administered 2022-10-10: 2 [IU] via SUBCUTANEOUS
  Administered 2022-10-11: 3 [IU] via SUBCUTANEOUS
  Filled 2022-10-09: qty 0.15

## 2022-10-09 MED ORDER — CYCLOBENZAPRINE HCL 10 MG PO TABS
10.0000 mg | ORAL_TABLET | Freq: Every day | ORAL | Status: DC
  Administered 2022-10-10 – 2022-10-11 (×3): 10 mg via ORAL
  Filled 2022-10-09 (×3): qty 1

## 2022-10-09 MED ORDER — LACTATED RINGERS IV SOLN: INTRAVENOUS | Status: DC

## 2022-10-09 MED ORDER — IPRATROPIUM-ALBUTEROL 0.5-2.5 (3) MG/3ML IN SOLN: 3.0000 mL | Freq: Four times a day (QID) | RESPIRATORY_TRACT | Status: DC | PRN

## 2022-10-09 MED ORDER — MOMETASONE FURO-FORMOTEROL FUM 200-5 MCG/ACT IN AERO
2.0000 | INHALATION_SPRAY | Freq: Two times a day (BID) | RESPIRATORY_TRACT | Status: DC
  Administered 2022-10-09 – 2022-10-12 (×6): 2 via RESPIRATORY_TRACT
  Filled 2022-10-09: qty 8.8

## 2022-10-09 MED ORDER — OLOPATADINE HCL 0.1 % OP SOLN
1.0000 [drp] | Freq: Two times a day (BID) | OPHTHALMIC | Status: DC
  Administered 2022-10-10 – 2022-10-12 (×5): 1 [drp] via OPHTHALMIC
  Filled 2022-10-09: qty 5

## 2022-10-09 MED ORDER — GUAIFENESIN-CODEINE 225-7.5 MG/5ML PO LIQD: 5.0000 mL | Freq: Three times a day (TID) | ORAL | Status: DC | PRN

## 2022-10-09 MED ORDER — BUPROPION HCL ER (XL) 300 MG PO TB24
300.0000 mg | ORAL_TABLET | Freq: Every day | ORAL | Status: DC
  Administered 2022-10-10 – 2022-10-12 (×3): 300 mg via ORAL
  Filled 2022-10-09 (×4): qty 1

## 2022-10-09 MED ORDER — SILVER SULFADIAZINE 1 % EX CREA: 1.0000 | TOPICAL_CREAM | Freq: Every day | CUTANEOUS | 1 refills | Status: DC

## 2022-10-09 MED ORDER — ATORVASTATIN CALCIUM 10 MG PO TABS
20.0000 mg | ORAL_TABLET | Freq: Every day | ORAL | Status: DC
  Administered 2022-10-10 – 2022-10-12 (×3): 20 mg via ORAL
  Filled 2022-10-09 (×3): qty 2

## 2022-10-09 MED ORDER — METHYLPREDNISOLONE SODIUM SUCC 40 MG IJ SOLR
40.0000 mg | Freq: Two times a day (BID) | INTRAMUSCULAR | Status: DC
  Administered 2022-10-10 – 2022-10-12 (×5): 40 mg via INTRAVENOUS
  Filled 2022-10-09 (×4): qty 1

## 2022-10-09 MED ORDER — ALBUTEROL SULFATE HFA 108 (90 BASE) MCG/ACT IN AERS: 2.0000 | INHALATION_SPRAY | RESPIRATORY_TRACT | Status: DC | PRN

## 2022-10-09 MED ORDER — ATENOLOL 25 MG PO TABS
25.0000 mg | ORAL_TABLET | Freq: Every day | ORAL | Status: DC
  Administered 2022-10-11 – 2022-10-12 (×2): 25 mg via ORAL
  Filled 2022-10-09 (×3): qty 1

## 2022-10-09 MED ORDER — NAPROXEN 250 MG PO TABS
500.0000 mg | ORAL_TABLET | Freq: Two times a day (BID) | ORAL | Status: DC
  Administered 2022-10-10 – 2022-10-12 (×4): 500 mg via ORAL
  Filled 2022-10-09 (×5): qty 2

## 2022-10-09 MED ORDER — ALBUTEROL SULFATE (2.5 MG/3ML) 0.083% IN NEBU: 2.5000 mg | INHALATION_SOLUTION | RESPIRATORY_TRACT | Status: DC | PRN

## 2022-10-09 MED ORDER — LEVOTHYROXINE SODIUM 25 MCG PO TABS
25.0000 ug | ORAL_TABLET | Freq: Every day | ORAL | Status: DC
  Administered 2022-10-10 – 2022-10-12 (×3): 25 ug via ORAL
  Filled 2022-10-09 (×3): qty 1

## 2022-10-09 MED ORDER — POLYETHYLENE GLYCOL 3350 17 G PO PACK: 17.0000 g | PACK | Freq: Every day | ORAL | Status: DC | PRN

## 2022-10-09 MED ORDER — ONDANSETRON HCL 4 MG/2ML IJ SOLN
4.0000 mg | Freq: Four times a day (QID) | INTRAMUSCULAR | Status: DC | PRN
  Administered 2022-10-09: 4 mg via INTRAVENOUS

## 2022-10-09 MED ORDER — QUETIAPINE FUMARATE 100 MG PO TABS
100.0000 mg | ORAL_TABLET | Freq: Every day | ORAL | Status: DC
  Administered 2022-10-10 – 2022-10-11 (×3): 100 mg via ORAL
  Filled 2022-10-09 (×3): qty 1

## 2022-10-09 MED ORDER — TRAZODONE HCL 100 MG PO TABS
100.0000 mg | ORAL_TABLET | Freq: Every day | ORAL | Status: DC
  Administered 2022-10-10 – 2022-10-11 (×3): 100 mg via ORAL
  Filled 2022-10-09 (×3): qty 1

## 2022-10-09 MED ORDER — SODIUM CHLORIDE 0.9 % IV SOLN
1.0000 g | INTRAVENOUS | Status: DC
  Administered 2022-10-10 – 2022-10-12 (×3): 1 g via INTRAVENOUS
  Filled 2022-10-09 (×3): qty 10

## 2022-10-09 NOTE — Assessment & Plan Note (Signed)
CPAP QHS

## 2022-10-09 NOTE — ED Notes (Signed)
ED TO INPATIENT HANDOFF REPORT  ED Nurse Name and Phone #: Huntley Dec [phone]  S Name/Age/Gender Joan [name] 62 y.o. female Room/Bed: WA01/WA01  Code Status   Code Status: Full Code  Home/SNF/Other Patient oriented to: self, place, time, and situation Is this baseline? Yes   Triage Complete: Triage complete  Chief Complaint COPD exacerbation (HCC) [J44.1]  Triage Note Pt BIBA from home. C/o SOB that worsened today.   Pt on 3L [place] at baseline  Pt does have leg infection and is under care of provider- saw them today  Given:  2x duonebs 10 mg albuterol 2 mg atrovent 2 gm Mag 0.3 mg epi  AOx4   Allergies Allergies  Allergen Reactions   Keflex [Cephalexin] Rash and Other (See Comments)    Has tolerated amoxicillin since had keflex   Shellfish Allergy Shortness Of Breath, Nausea And Vomiting and Other (See Comments)    Stomach cramps   Tetracyclines & Related Anaphylaxis, Swelling and Other (See Comments)    Throat swelling requiring hospitalization   Dilaudid [Hydromorphone Hcl] Other (See Comments)    "Lethargy"   Prednisone Other (See Comments)    "counter reacts", has tolerated IV medrol   Clindamycin/Lincomycin Other (See Comments)    Burning sensation in her mouth down her throat    Incruse Ellipta [Umeclidinium Bromide] Nausea Only   Molds & Smuts Itching   Tuberculin Tests Other (See Comments)    False postive   Umeclidinium     Other Reaction(s): GI Intolerance    Level of Care/Admitting Diagnosis ED Disposition     ED Disposition  Admit   Condition  --   Comment  [facility][id number][facility]  Level of Care: Med-Surg [16]  May place patient in observation at [facility] or [facility] if equivalent level of care is available:: No  Covid Evaluation: Confirmed COVID Negative  Diagnosis: COPD exacerbation [facility]) [[id number]]  Admitting Physician: Hillary Bow [phone]  Attending Physician: Hillary Bow [phone]           B Medical/Surgery History Past Medical History:  Diagnosis Date   Acute respiratory failure with hypoxia (HCC)    Alcoholism and drug addiction in family    Anxiety    Appendicitis    Arthritis    "knees, hips, lower back" (09/25/2016)   Asthma    Bipolar disorder (HCC)    Chronic bronchitis (HCC)    "all the time" (09/25/2016)   Chronic leg pain    RLE   Chronic lower back pain    COPD (chronic obstructive pulmonary disease) (HCC)    Depression    Diabetes mellitus without complication (HCC)    Diabetic neuropathy (HCC)    Early cataracts, bilateral    Emphysema of lung (HCC)    Generalized anxiety disorder 02/09/2020   GERD (gastroesophageal reflux disease)    Headache    Hepatitis C    "treated back in 2001-2002"   High cholesterol    Hypertension    Hypothyroidism    Incarcerated ventral hernia 04/04/2017   Mood disorder in conditions classified elsewhere 02/09/2020   On home oxygen therapy    "2L; at nighttime" (04/04/2017)   OSA on CPAP    CPAP with Oxygen 3 liters   Paranoid (HCC)    Pneumonia    "all the time" (09/25/2016)   Positive PPD    with negative chest x ray   PTSD (post-traumatic stress disorder) 02/09/2020   Tachycardia  patient reports with exertion ; denies any other conditions    Thyroid disease    Wears glasses    Wears partial dentures    Past Surgical History:  Procedure Laterality Date   ANTERIOR CERVICAL DECOMP/DISCECTOMY FUSION N/A 02/14/2022   Procedure: Anterior Cervical Decompression/Discectomy Fusion Cervical Three-Cervical Four;  Surgeon: Bedelia Person, MD;  Location: [facility] OR;  Service: Neurosurgery;  Laterality: N/A;  3C   APPENDECTOMY     BACK SURGERY     BIOPSY  11/24/2018   Procedure: BIOPSY;  Surgeon: Tressia Danas, MD;  Location: [facility];  Service: Gastroenterology;;   CARDIAC CATHETERIZATION     COLONOSCOPY     COLONOSCOPY WITH PROPOFOL N/A 11/24/2018   Procedure: COLONOSCOPY WITH PROPOFOL;  Surgeon:  Tressia Danas, MD;  Location: [facility];  Service: Gastroenterology;  Laterality: N/A;   CYST EXCISION     removal of cyst in sinuses   DILATION AND CURETTAGE OF UTERUS     ESOPHAGOGASTRODUODENOSCOPY (EGD) WITH PROPOFOL N/A 11/24/2018   Procedure: ESOPHAGOGASTRODUODENOSCOPY (EGD) WITH PROPOFOL;  Surgeon: Tressia Danas, MD;  Location: [facility];  Service: Gastroenterology;  Laterality: N/A;   FOOT SURGERY Bilateral    bone removed from 4th and 5 th toes and put in pin then took pin out   HERNIA REPAIR     INCISION AND DRAINAGE  ~ 2014/2015   "removed mesh & infection"   INCISIONAL HERNIA REPAIR N/A 04/04/2017   Procedure: LAPAROSCOPIC INCISIONAL HERNIA REPAIR WITH MESH;  Surgeon: Claud Kelp, MD;  Location: [facility] OR;  Service: General;  Laterality: N/A;   LACERATION REPAIR Right    repair wrist artery from laceration from ice skate   LAPAROSCOPIC APPENDECTOMY N/A 03/31/2019   Procedure: APPENDECTOMY LAPAROSCOPIC;  Surgeon: Axel Filler, MD;  Location: [facility];  Service: General;  Laterality: N/A;   LAPAROSCOPIC APPENDECTOMY N/A 07/09/2019   Procedure: APPENDECTOMY LAPAROSCOPIC;  Surgeon: Axel Filler, MD;  Location: [facility] OR;  Service: General;  Laterality: N/A;   LAPAROSCOPIC INCISIONAL / UMBILICAL / VENTRAL HERNIA REPAIR  04/04/2017   LAPAROSCOPIC INCISIONAL HERNIA REPAIR WITH MESH   LIPOMA EXCISION Right    fattty lipoma in neck   NASAL SEPTUM SURGERY     POSTERIOR LUMBAR FUSION  2015/2016 X 2   "added rods and screws"   SINOSCOPY     TOTAL HIP ARTHROPLASTY Right 02/05/2017   Procedure: RIGHT TOTAL HIP ARTHROPLASTY;  Surgeon: Ranee Gosselin, MD;  Location: [facility];  Service: Orthopedics;  Laterality: Right;   TOTAL HIP ARTHROPLASTY Left 06/18/2017   Procedure: LEFT TOTAL HIP ARTHROPLASTY;  Surgeon: Ranee Gosselin, MD;  Location: [facility];  Service: Orthopedics;  Laterality: Left;   TOTAL HIP REVISION Left 01/26/2019   Procedure: TOTAL HIP REVISION;  Surgeon: Durene Romans, MD;  Location: [facility];  Service: Orthopedics;  Laterality: Left;  2 hrs   TUBAL LIGATION     UMBILICAL HERNIA REPAIR  ~ 2013   w/mesh   UPPER GI ENDOSCOPY       A IV Location/Drains/Wounds Patient Lines/Drains/Airways Status     Active Line/Drains/Airways     Name Placement date Placement time Site Days   Peripheral IV 10/09/22 20 G Left Antecubital 10/09/22  1813  Antecubital  less than 1   Pressure Injury 08/15/20 Deep Tissue Pressure Injury - Purple or maroon localized area of discolored intact skin or blood-filled blister due to damage of underlying soft tissue from pressure and/or shear. 08/15/20  --  -- 785  Intake/Output Last 24 hours  Intake/Output Summary (Last 24 hours) at 10/09/2022 2203 Last data filed at 10/09/2022 2118 Gross per 24 hour  Intake 144.44 ml  Output --  Net 144.44 ml    Labs/Imaging Results for orders placed or performed during the hospital encounter of 10/09/22 (from the past 48 hour(s))  CBC with Differential/Platelet     Status: Abnormal   Collection Time: 10/09/22  7:10 PM  Result Value Ref Range   WBC 8.8 4.0 - 10.5 K/uL   RBC 3.78 (L) 3.87 - 5.11 MIL/uL   Hemoglobin 11.0 (L) 12.0 - 15.0 g/dL   HCT 09.8 (L) 11.9 - 14.7 %   MCV 86.2 80.0 - 100.0 fL   MCH 29.1 26.0 - 34.0 pg   MCHC 33.7 30.0 - 36.0 g/dL   RDW 82.9 56.2 - 13.0 %   Platelets 298 150 - 400 K/uL   nRBC 0.0 0.0 - 0.2 %   Neutrophils Relative % 72 %   Neutro Abs 6.3 1.7 - 7.7 K/uL   Lymphocytes Relative 19 %   Lymphs Abs 1.7 0.7 - 4.0 K/uL   Monocytes Relative 6 %   Monocytes Absolute 0.6 0.1 - 1.0 K/uL   Eosinophils Relative 2 %   Eosinophils Absolute 0.2 0.0 - 0.5 K/uL   Basophils Relative 1 %   Basophils Absolute 0.1 0.0 - 0.1 K/uL   Immature Granulocytes 0 %   Abs Immature Granulocytes 0.03 0.00 - 0.07 K/uL    Comment: Performed at [facility], [address] [place], [place] [address]  Basic metabolic panel     Status:  Abnormal   Collection Time: 10/09/22  7:10 PM  Result Value Ref Range   Sodium 127 (L) 135 - 145 mmol/L   Potassium 2.6 (LL) 3.5 - 5.1 mmol/L    Comment: CRITICAL RESULT CALLED TO, READ BACK BY AND VERIFIED WITH S., RN AT 2012 ON 08.14.24 BY N.THOMPSON    Chloride 83 (L) 98 - 111 mmol/L   CO2 34 (H) 22 - 32 mmol/L   Glucose, Bld 127 (H) 70 - 99 mg/dL    Comment: Glucose reference range applies only to samples taken after fasting for at least 8 hours.   BUN 7 6 - 20 mg/dL   Creatinine, Ser 4.69 0.44 - 1.00 mg/dL   Calcium 9.0 8.9 - 62.9 mg/dL   GFR, Estimated >52 >84 mL/min    Comment: (NOTE) Calculated using the CKD-EPI Creatinine Equation (2021)    Anion gap 10 5 - 15    Comment: Performed at [facility], [address], [place], [place] [address]  SARS Coronavirus 2 by RT PCR (hospital order, performed in [facility] hospital lab) *cepheid single result test* Anterior Nasal Swab     Status: None   Collection Time: 10/09/22  7:10 PM   Specimen: Anterior Nasal Swab  Result Value Ref Range   SARS Coronavirus 2 by RT PCR NEGATIVE NEGATIVE    Comment: (NOTE) SARS-CoV-2 target nucleic acids are NOT DETECTED.  The SARS-CoV-2 RNA is generally detectable in upper and lower respiratory specimens during the acute phase of infection. The lowest concentration of SARS-CoV-2 viral copies this assay can detect is 250 copies / mL. A negative result does not preclude SARS-CoV-2 infection and should not be used as the sole basis for treatment or other patient management decisions.  A negative result may occur with improper specimen collection / handling, submission of specimen other than nasopharyngeal swab, presence  of viral mutation(s) within the areas targeted by this assay, and inadequate number of viral copies (<250 copies / mL). A negative result must be combined with clinical observations, patient history, and epidemiological information.  Fact Sheet for  Patients:   RoadLapTop.co.za  Fact Sheet for Healthcare Providers: http://kim-miller.com/  This test is not yet approved or  cleared by the [place] FDA and has been authorized for detection and/or diagnosis of SARS-CoV-2 by FDA under an Emergency Use Authorization (EUA).  This EUA will remain in effect (meaning this test can be used) for the duration of the COVID-19 declaration under Section 564(b)(1) of the Act, 21 U.S.C. section 360bbb-3(b)(1), unless the authorization is terminated or revoked sooner.  Performed at [facility], [address] [place], [place] [address]   I-Stat CG4 Lactic Acid     Status: None   Collection Time: 10/09/22  7:38 PM  Result Value Ref Range   Lactic Acid, Venous 1.3 0.5 - 1.9 mmol/L  I-Stat CG4 Lactic Acid     Status: None   Collection Time: 10/09/22  9:34 PM  Result Value Ref Range   Lactic Acid, Venous 1.3 0.5 - 1.9 mmol/L   DG Chest Port 1 View  Result Date: 10/09/2022 CLINICAL DATA:  SOB (shortness of breath) EXAM: PORTABLE CHEST 1 VIEW COMPARISON:  CXR 09/14/22 FINDINGS: Small left pleural effusion. No pneumothorax. Normal cardiac and mediastinal contours. Hazy opacity at the left lung base favored to represent atelectasis. No radiographically apparent displaced rib fractures. Visualized upper abdomen is unremarkable. IMPRESSION: Likely trace left pleural effusion with superimposed airspace opacity that could represent atelectasis or infection. Electronically Signed   By: Lorenza Cambridge M.D.   On: 10/09/2022 18:50   DG Foot Complete Right  Result Date: 10/09/2022 Please see detailed radiograph report in office note.   Pending Labs Unresulted Labs (From admission, onward)     Start     Ordered   10/10/22 0500  Basic metabolic panel  Tomorrow morning,   R        10/09/22 2046   10/09/22 2050  HIV Antibody (routine testing w rflx)  (HIV Antibody (Routine testing w reflex)  panel)  Once,   R        10/09/22 2058   10/09/22 1911  Culture, blood (Routine X 2) w Reflex to ID Panel  BLOOD CULTURE X 2,   R     Question:  Patient immune status  Answer:  Normal   10/09/22 1910            Vitals/Pain Today's Vitals   10/09/22 1812 10/09/22 1817 10/09/22 1823  BP:   129/64  Pulse:   65  Resp:   19  Temp:   98.1 F (36.7 C)  TempSrc:   Oral  SpO2: 99%  100%  Weight:  88.5 kg   Height:  5\' 8"  (1.727 m)   PainSc:  6      Isolation Precautions Airborne and Contact precautions  Medications Medications  albuterol (VENTOLIN HFA) 108 (90 Base) MCG/ACT inhaler 2 puff (has no administration in time range)  lactated ringers infusion (has no administration in time range)  potassium chloride 10 mEq in 100 mL IVPB (10 mEq Intravenous New Bag/Given 10/09/22 2128)  enoxaparin (LOVENOX) injection 40 mg (has no administration in time range)  cefTRIAXone (ROCEPHIN) 1 g in sodium chloride 0.9 % 100 mL IVPB (has no administration in time range)  methylPREDNISolone sodium succinate (SOLU-MEDROL) 40 mg/mL injection 40 mg (  has no administration in time range)  albuterol (PROVENTIL) (2.5 MG/3ML) 0.083% nebulizer solution 2.5 mg (has no administration in time range)  Budeson-Glycopyrrol-Formoterol 160-9-4.8 MCG/ACT AERO 2 puff (has no administration in time range)  insulin aspart (novoLOG) injection 0-15 Units (has no administration in time range)  insulin aspart (novoLOG) injection 0-5 Units (has no administration in time range)  levofloxacin (LEVAQUIN) IVPB 750 mg (0 mg Intravenous Stopped 10/09/22 2118)  potassium chloride SA (KLOR-CON M) CR tablet 60 mEq (60 mEq Oral Given 10/09/22 2037)    Mobility walks     Focused Assessments  R Recommendations: See Admitting Provider Note  Report given to:   Additional Notes:

## 2022-10-09 NOTE — Assessment & Plan Note (Addendum)
"  Terminal" COPD on hospice care at baseline per [facility] admit notes from last month, though she remains full code... Still smoking. COPD pathway COVID neg Rocephin (tolerated last month during admit) Solumedrol (looks like she tolerates IV medrol but not PO prednisone per allergy notes, I'm guessing what's actually going on = po prednisone isnt strong enough and needs the higher dose of medrol for her severe COPD symptoms) PRN albuterol BID Breztri Pal care consult added to epic: Looks like she's an active hospice patient Still full code however Looks like discussions are ongoing with pal care service, see their consult notes from [facility] admit last month. Living will in chart as well as naming daughter [facility]

## 2022-10-09 NOTE — Progress Notes (Signed)
   10/09/22 2357  BiPAP/CPAP/SIPAP  $ Non-Invasive Home Ventilator  Initial  BiPAP/CPAP/SIPAP Pt Type Adult  BiPAP/CPAP/SIPAP DREAMSTATIOND  Mask Type Nasal mask (PER PT HOME REGIMEN)  Mask Size Small  Respiratory Rate 20 breaths/min  EPAP 10 cmH2O (HOME SETTINGS PER PT)  Flow Rate 3 lpm  Auto Titrate No  CPAP/SIPAP surface wiped down Yes  BiPAP/CPAP /SiPAP Vitals  Pulse Rate 74  Resp 20  SpO2 93 %  Bilateral Breath Sounds Expiratory wheezes

## 2022-10-09 NOTE — Assessment & Plan Note (Signed)
On O2 at baseline.

## 2022-10-09 NOTE — ED Notes (Signed)
Pt vehicle located at [address] ([address] [place]) [place].

## 2022-10-09 NOTE — ED Triage Notes (Addendum)
Pt BIBA from home. C/o SOB that worsened today.   Pt on 3L [place] at baseline  Pt does have leg infection and is under care of provider- saw them today  Given:  2x duonebs 10 mg albuterol 2 mg atrovent 2 gm Mag 0.3 mg epi  AOx4

## 2022-10-09 NOTE — Assessment & Plan Note (Signed)
Cont atenolol Hold hydrochlorothiazide due to hyponatremia + hypokalemia this admit.
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
## 2022-10-09 NOTE — Progress Notes (Signed)
Chief Complaint  Patient presents with   Foot Pain    Foot got caught about a month ago. She states the ulcers opened back up. No fevers or chills - x-ray right foot done     Subjective:  62 y.o. female with PMHx of diabetes mellitus presenting today for evaluation of an injury that she sustained about 1 month ago.  Patient states that about a month ago she hit her left foot and leg and sustained a laceration to the anterior ankle as well as wounds to the left great toe and second digit.  She says that the hospice nurses at her home have been assisting her with dressing changes.  She presents for further treatment and evaluation   Past Medical History:  Diagnosis Date   Acute respiratory failure with hypoxia (HCC)    Alcoholism and drug addiction in family    Anxiety    Appendicitis    Arthritis    "knees, hips, lower back" (09/25/2016)   Asthma    Bipolar disorder (HCC)    Chronic bronchitis (HCC)    "all the time" (09/25/2016)   Chronic leg pain    RLE   Chronic lower back pain    COPD (chronic obstructive pulmonary disease) (HCC)    Depression    Diabetes mellitus without complication (HCC)    Diabetic neuropathy (HCC)    Early cataracts, bilateral    Emphysema of lung (HCC)    Generalized anxiety disorder 02/09/2020   GERD (gastroesophageal reflux disease)    Headache    Hepatitis C    "treated back in 2001-2002"   High cholesterol    Hypertension    Hypothyroidism    Incarcerated ventral hernia 04/04/2017   Mood disorder in conditions classified elsewhere 02/09/2020   On home oxygen therapy    "2L; at nighttime" (04/04/2017)   OSA on CPAP    CPAP with Oxygen 3 liters   Paranoid (HCC)    Pneumonia    "all the time" (09/25/2016)   Positive PPD    with negative chest x ray   PTSD (post-traumatic stress disorder) 02/09/2020   Tachycardia    patient reports with exertion ; denies any other conditions    Thyroid disease    Wears glasses    Wears partial dentures      Past Surgical History:  Procedure Laterality Date   ANTERIOR CERVICAL DECOMP/DISCECTOMY FUSION N/A 02/14/2022   Procedure: Anterior Cervical Decompression/Discectomy Fusion Cervical Three-Cervical Four;  Surgeon: Bedelia Person, MD;  Location: [facility] OR;  Service: Neurosurgery;  Laterality: N/A;  3C   APPENDECTOMY     BACK SURGERY     BIOPSY  11/24/2018   Procedure: BIOPSY;  Surgeon: Tressia Danas, MD;  Location: [facility];  Service: Gastroenterology;;   CARDIAC CATHETERIZATION     COLONOSCOPY     COLONOSCOPY WITH PROPOFOL N/A 11/24/2018   Procedure: COLONOSCOPY WITH PROPOFOL;  Surgeon: Tressia Danas, MD;  Location: [facility];  Service: Gastroenterology;  Laterality: N/A;   CYST EXCISION     removal of cyst in sinuses   DILATION AND CURETTAGE OF UTERUS     ESOPHAGOGASTRODUODENOSCOPY (EGD) WITH PROPOFOL N/A 11/24/2018   Procedure: ESOPHAGOGASTRODUODENOSCOPY (EGD) WITH PROPOFOL;  Surgeon: Tressia Danas, MD;  Location: [facility];  Service: Gastroenterology;  Laterality: N/A;   FOOT SURGERY Bilateral    bone removed from 4th and 5 th toes and put in pin then took pin out   HERNIA REPAIR     INCISION AND  DRAINAGE  ~ 2014/2015   "removed mesh & infection"   INCISIONAL HERNIA REPAIR N/A 04/04/2017   Procedure: LAPAROSCOPIC INCISIONAL HERNIA REPAIR WITH MESH;  Surgeon: Claud Kelp, MD;  Location: [facility] OR;  Service: General;  Laterality: N/A;   LACERATION REPAIR Right    repair wrist artery from laceration from ice skate   LAPAROSCOPIC APPENDECTOMY N/A 03/31/2019   Procedure: APPENDECTOMY LAPAROSCOPIC;  Surgeon: Axel Filler, MD;  Location: [facility]S;  Service: General;  Laterality: N/A;   LAPAROSCOPIC APPENDECTOMY N/A 07/09/2019   Procedure: APPENDECTOMY LAPAROSCOPIC;  Surgeon: Axel Filler, MD;  Location: [facility] OR;  Service: General;  Laterality: N/A;   LAPAROSCOPIC INCISIONAL / UMBILICAL / VENTRAL HERNIA REPAIR  04/04/2017   LAPAROSCOPIC INCISIONAL HERNIA  REPAIR WITH MESH   LIPOMA EXCISION Right    fattty lipoma in neck   NASAL SEPTUM SURGERY     POSTERIOR LUMBAR FUSION  2015/2016 X 2   "added rods and screws"   SINOSCOPY     TOTAL HIP ARTHROPLASTY Right 02/05/2017   Procedure: RIGHT TOTAL HIP ARTHROPLASTY;  Surgeon: Ranee Gosselin, MD;  Location: [facility];  Service: Orthopedics;  Laterality: Right;   TOTAL HIP ARTHROPLASTY Left 06/18/2017   Procedure: LEFT TOTAL HIP ARTHROPLASTY;  Surgeon: Ranee Gosselin, MD;  Location: [facility];  Service: Orthopedics;  Laterality: Left;   TOTAL HIP REVISION Left 01/26/2019   Procedure: TOTAL HIP REVISION;  Surgeon: Durene Romans, MD;  Location: [facility];  Service: Orthopedics;  Laterality: Left;  2 hrs   TUBAL LIGATION     UMBILICAL HERNIA REPAIR  ~ 2013   w/mesh   UPPER GI ENDOSCOPY      Allergies  Allergen Reactions   Keflex [Cephalexin] Rash and Other (See Comments)    Has tolerated amoxicillin since had keflex   Shellfish Allergy Shortness Of Breath, Nausea And Vomiting and Other (See Comments)    Stomach cramps   Tetracyclines & Related Anaphylaxis, Swelling and Other (See Comments)    Throat swelling requiring hospitalization   Dilaudid [Hydromorphone Hcl] Other (See Comments)    "Lethargy"   Prednisone Other (See Comments)    "counter reacts", has tolerated IV medrol   Clindamycin/Lincomycin Other (See Comments)    Burning sensation in her mouth down her throat    Incruse Ellipta [Umeclidinium Bromide] Nausea Only   Molds & Smuts Itching   Tuberculin Tests Other (See Comments)    False postive   Umeclidinium     Other Reaction(s): GI Intolerance    RT leg 10/09/2022  RT toes 10/09/2022  Objective/Physical Exam General: The patient is alert and oriented x3 in no acute distress.  Dermatology:  Wound #1 and #2 noted to the second digit in the great toe of the right foot.  The wounds measure both approximately 0.8 x 0.8 x 0.1 cm (LxWxD).  After debridement of the loosely adhered fibrous  tissue there was healthy underlying granular tissue.  There was no exposed bone tendon or ligament.  Good potential for healing. Wound also noted to the left anterior ankle/leg.  Please see above noted photo.  Again, this appears very stable with good potential for healing  Skin is warm, dry and supple bilateral lower extremities.  Vascular: VAS [place] ABI WITH/WO TBI 07/06/2021 ABI Findings:  +---------+------------------+-----+---------+--------+  Right   Rt Pressure (mmHg)IndexWaveform Comment   +---------+------------------+-----+---------+--------+  Brachial 124                                       +---------+------------------+-----+---------+--------+  PTA     137               1.10 triphasic          +---------+------------------+-----+---------+--------+  DP      126               1.02 triphasic          +---------+------------------+-----+---------+--------+  Great Toe98                0.79                    +---------+------------------+-----+---------+--------+   +---------+------------------+-----+---------+-------+  Left    Lt Pressure (mmHg)IndexWaveform Comment  +---------+------------------+-----+---------+-------+  Brachial 119                                      +---------+------------------+-----+---------+-------+  PTA     132               1.06 triphasic         +---------+------------------+-----+---------+-------+  DP      131               1.06 triphasic         +---------+------------------+-----+---------+-------+  Great Toe87                0.70                   +---------+------------------+-----+---------+-------+   +-------+-----------+-----------+------------+------------+  ABI/TBIToday's ABIToday's TBIPrevious ABIPrevious TBI  +-------+-----------+-----------+------------+------------+  Right 1.10       0.79                                  +-------+-----------+-----------+------------+------------+  Left  1.06       0.70                                 +-------+-----------+-----------+------------+------------+  Summary:  Right: Resting right ankle-brachial index is within normal range. No  evidence of significant right lower extremity arterial disease. The right  toe-brachial index is normal.   Left: Resting left ankle-brachial index is within normal range. No  evidence of significant left lower extremity arterial disease. The left  toe-brachial index is normal.   Neurological: Light touch and protective threshold diminished bilaterally.   Musculoskeletal Exam: Patient is minimally ambulatory and mostly in a wheelchair.  Assessment: 1.  Ulcers right foot and ankle secondary to diabetes mellitus 2. diabetes mellitus w/ peripheral neuropathy   Plan of Care:  -Patient was evaluated. -Medically necessary excisional debridement including subcutaneous tissue was performed using a tissue nipper and a chisel blade. Excisional debridement of all the necrotic nonviable tissue down to healthy bleeding viable tissue was performed with post-debridement measurements same as pre-. -The wound was cleansed and dry sterile dressing applied. -Prescription for Silvadene cream.  Apply daily -Patient has hospice coming to the home daily for advanced COPD and pulmonary fibrosis.  The nurses are helping her with the dressing changes.  Continue. -Return to clinic 3 weeks   Felecia Shelling, DPM [facility]  Dr. Felecia Shelling, DPM    [address]  [place], [place] [address]                Office [phone]  Fax [phone]

## 2022-10-09 NOTE — Assessment & Plan Note (Signed)
Dont see that she's on any home meds for this. Mod scale SSI AC/HS for the moment while on steroids for COPD exacerbation.

## 2022-10-09 NOTE — Telephone Encounter (Signed)
Placed inside office box 

## 2022-10-09 NOTE — ED Provider Notes (Signed)
[place] EMERGENCY DEPARTMENT AT [facility] Provider Note   CSN: [id number] Arrival date & time: 10/09/22  1805     History  Chief Complaint  Patient presents with   Shortness of Breath    Joan [name] is a 62 y.o. female.  62 year old female with history of COPD presents with shortness of breath which began today.  Patient is on oxygen at 3 L at home.  She continues to smoke cigarettes.  Has noted some change to the color of her sputum.  Denies any fever or myalgias.  Called EMS due to severe wheezing.  Patient treated aggressively by them with 2 DuoNebs, 10 mg of albuterol, 2 mg of Atrovent, 2 g of magnesium, epinephrine.  Patient's breathing has slightly improved.  Denies any nausea vomiting or diarrhea.  No chest pain or peripheral edema       Home Medications Prior to Admission medications   Medication Sig Start Date End Date Taking? Authorizing Provider  Accu-Chek Softclix Lancets lancets Use as instructed 05/21/21   Myrlene Broker, MD  acyclovir (ZOVIRAX) 400 MG tablet TAKE 1 TABLET BY MOUTH TWICE  DAILY 08/14/22   Myrlene Broker, MD  albuterol (PROVENTIL) (2.5 MG/3ML) 0.083% nebulizer solution Take 3 mLs (2.5 mg total) by nebulization 2 (two) times daily. And every 6 hours as needed.  DX: J44.9 01/05/21   Bevelyn Ngo, NP  atenolol (TENORMIN) 25 MG tablet TAKE 1 TABLET BY MOUTH DAILY 09/18/22   Myrlene Broker, MD  atorvastatin (LIPITOR) 20 MG tablet TAKE 1 TABLET BY MOUTH ONCE  DAILY 07/23/22   Myrlene Broker, MD  Blood Glucose Monitoring Suppl DEVI 1 each by Does not apply route in the morning, at noon, and at bedtime. May substitute to any manufacturer covered by patient's insurance. 08/28/22   Myrlene Broker, MD  Budeson-Glycopyrrol-Formoterol (BREZTRI AEROSPHERE) 160-9-4.8 MCG/ACT AERO Inhale 2 puffs into the lungs in the morning and at bedtime. 05/08/21   Oretha Milch, MD  buPROPion (WELLBUTRIN XL) 300 MG 24 hr tablet  TAKE 1 TABLET BY MOUTH DAILY 06/04/22   Thresa Ross, MD  busPIRone (BUSPAR) 5 MG tablet Take 1 tablet (5 mg total) by mouth at bedtime. 06/17/22   Thresa Ross, MD  cyclobenzaprine (FLEXERIL) 10 MG tablet TAKE 1 TABLET BY MOUTH AT  BEDTIME 07/31/22   Myrlene Broker, MD  diphenhydrAMINE (BENADRYL) 25 mg capsule Take 25 mg by mouth 2 (two) times daily.    [provider]  fluticasone (FLONASE) 50 MCG/ACT nasal spray Place 2 sprays into both nostrils daily. Patient taking differently: Place 2 sprays into both nostrils daily as needed for allergies. 09/05/21 02/07/22  Alfonse Spruce, MD  guaiFENesin-Codeine 225-7.5 MG/5ML LIQD Take 5 mLs by mouth 3 (three) times daily as needed. 07/30/22   Oretha Milch, MD  hydrochlorothiazide (HYDRODIURIL) 25 MG tablet TAKE 1 TABLET BY MOUTH DAILY 07/23/22   Myrlene Broker, MD  Ipratropium-Albuterol (COMBIVENT RESPIMAT) 20-100 MCG/ACT AERS respimat Inhale 1 puff into the lungs every 6 (six) hours as needed for wheezing or shortness of breath. 04/02/22   Bevelyn Ngo, NP  levothyroxine (SYNTHROID) 25 MCG tablet TAKE 1 TABLET BY MOUTH DAILY  BEFORE BREAKFAST 07/23/22   Myrlene Broker, MD  MELATONIN PO Take 1 tablet by mouth at bedtime as needed (sleep).    [provider]  montelukast (SINGULAIR) 10 MG tablet TAKE 1 TABLET BY MOUTH AT  BEDTIME 07/23/22   Cyril Mourning  V, MD  naproxen (NAPROSYN) 500 MG tablet Take 1 tablet (500 mg total) by mouth 2 (two) times daily with a meal. 03/29/22   Myrlene Broker, MD  nystatin-triamcinolone ointment [facility]) Apply 1 application topically 2 (two) times daily as needed (applied to skin folds/groins/under breast skin irritation rash.). 09/08/17   Myrlene Broker, MD  OXYGEN Inhale 3 L/min into the lungs See admin instructions. Inhale  3 L/min of oxygen into the lungs w/CPAP at bedtime and as needed for shortness of breath with "strenuous activity" during the day    [provider]  pantoprazole (PROTONIX) 40 MG tablet TAKE 1 TABLET BY MOUTH DAILY 12/21/21   Myrlene Broker, MD  PATADAY 0.2 % SOLN Place 1 drop into both eyes daily. 08/20/19   [provider]  polyethylene glycol (MIRALAX / GLYCOLAX) 17 g packet Take 17 g by mouth daily as needed for mild constipation. 04/14/19   Meuth, Brooke A, PA-C  pregabalin (LYRICA) 300 MG capsule TAKE 1 CAPSULE BY MOUTH TWICE  DAILY 10/03/22   Myrlene Broker, MD  QUEtiapine (SEROQUEL) 100 MG tablet TAKE 1 TABLET BY MOUTH AT  BEDTIME 07/31/22   Thresa Ross, MD  roflumilast (DALIRESP) 500 MCG TABS tablet Take 1 tablet (500 mcg total) by mouth daily. 04/02/22   Bevelyn Ngo, NP  silver sulfADIAZINE (SILVADENE) 1 % cream Apply 1 Application topically daily. 10/09/22   Felecia Shelling, DPM  traZODone (DESYREL) 100 MG tablet TAKE 1 TABLET BY MOUTH AT  BEDTIME 12/21/21   Myrlene Broker, MD  triamcinolone cream (KENALOG) 0.1 % Apply 1 Application topically 2 (two) times daily. 08/20/22   Myrlene Broker, MD  venlafaxine XR (EFFEXOR-XR) 150 MG 24 hr capsule TAKE 1 CAPSULE BY MOUTH DAILY  WITH BREAKFAST 12/21/21   Myrlene Broker, MD  witch hazel-glycerin (TUCKS) pad Apply topically as needed for itching. 08/18/20   Standley Brooking, MD      Allergies    Keflex [cephalexin], Shellfish allergy, Tetracyclines & related, Dilaudid [hydromorphone hcl], Prednisone, Clindamycin/lincomycin, Incruse ellipta [umeclidinium bromide], Molds & smuts, Tuberculin tests, and Umeclidinium    Review of Systems   Review of Systems  All other systems reviewed and are negative.   Physical Exam Updated Vital Signs BP 129/64 (BP Location: Left Arm)   Pulse 65   Temp 98.1 F (36.7 C) (Oral)   Resp 19   Ht 1.727 m (5\' 8" )   Wt 88.5 kg   SpO2 100%   BMI 29.65 kg/m  Physical Exam Vitals and nursing note reviewed.  Constitutional:      General: She is not in acute distress.    Appearance: Normal  appearance. She is well-developed. She is not toxic-appearing.  HENT:     Head: Normocephalic and atraumatic.  Eyes:     General: Lids are normal.     Conjunctiva/sclera: Conjunctivae normal.     Pupils: Pupils are equal, round, and reactive to light.  Neck:     Thyroid: No thyroid mass.     Trachea: No tracheal deviation.  Cardiovascular:     Rate and Rhythm: Normal rate and regular rhythm.     Heart sounds: Normal heart sounds. No murmur heard.    No gallop.  Pulmonary:     Effort: Tachypnea and respiratory distress present.     Breath sounds: No stridor. Examination of the right-upper field reveals wheezing. Examination of the left-upper field reveals wheezing. Wheezing present. No decreased breath sounds,  rhonchi or rales.  Abdominal:     General: There is no distension.     Palpations: Abdomen is soft.     Tenderness: There is no abdominal tenderness. There is no rebound.  Musculoskeletal:        General: No tenderness. Normal range of motion.     Cervical back: Normal range of motion and neck supple.  Skin:    General: Skin is warm and dry.     Findings: No abrasion or rash.  Neurological:     Mental Status: She is alert and oriented to person, place, and time. Mental status is at baseline.     GCS: GCS eye subscore is 4. GCS verbal subscore is 5. GCS motor subscore is 6.     Cranial Nerves: Cranial nerves are intact. No cranial nerve deficit.     Sensory: No sensory deficit.     Motor: Motor function is intact.  Psychiatric:        Attention and Perception: Attention normal.        Speech: Speech normal.        Behavior: Behavior normal.     ED Results / Procedures / Treatments   Labs (all labs ordered are listed, but only abnormal results are displayed) Labs Reviewed  SARS CORONAVIRUS 2 BY RT PCR  CBC WITH DIFFERENTIAL/PLATELET  BASIC METABOLIC PANEL    EKG EKG Interpretation Date/Time:  Wednesday October 09 2022 18:21:04 EDT Ventricular Rate:  67 PR  Interval:  177 QRS Duration:  124 QT Interval:  463 QTC Calculation: 489 R Axis:   94  Text Interpretation: Sinus rhythm Nonspecific intraventricular conduction delay ST elevation, consider inferior injury Confirmed by Lorre Nick ([id number]) on 10/09/2022 8:27:34 PM  Radiology DG Chest Port 1 View  Result Date: 10/09/2022 CLINICAL DATA:  SOB (shortness of breath) EXAM: PORTABLE CHEST 1 VIEW COMPARISON:  CXR 09/14/22 FINDINGS: Small left pleural effusion. No pneumothorax. Normal cardiac and mediastinal contours. Hazy opacity at the left lung base favored to represent atelectasis. No radiographically apparent displaced rib fractures. Visualized upper abdomen is unremarkable. IMPRESSION: Likely trace left pleural effusion with superimposed airspace opacity that could represent atelectasis or infection. Electronically Signed   By: Lorenza Cambridge M.D.   On: 10/09/2022 18:50   DG Foot Complete Right  Result Date: 10/09/2022 Please see detailed radiograph report in office note.   Procedures Procedures    Medications Ordered in ED Medications  albuterol (VENTOLIN HFA) 108 (90 Base) MCG/ACT inhaler 2 puff (has no administration in time range)  lactated ringers infusion (has no administration in time range)    ED Course/ Medical Decision Making/ A&P                                 Medical Decision Making Amount and/or Complexity of Data Reviewed Labs: ordered. Radiology: ordered.  Risk Prescription drug management.   Patient here after COPD exacerbation.  Patient given albuterol with slight improvement in her breathing.  Chest x-ray per interpretation showed possible infiltrate.  Started on empiric antibiotics.  COVID test is negative here.  Patient with severe hypokalemia likely from receiving albuterol.  Patient given oral as well as IV potassium.  CRITICAL CARE Performed by: Toy Baker Total critical care time: 55 minutes Critical care time was exclusive of separately billable  procedures and treating other patients. Critical care was necessary to treat or prevent imminent or life-threatening deterioration. Critical care was  time spent personally by me on the following activities: development of treatment plan with patient and/or surrogate as well as nursing, discussions with consultants, evaluation of patient's response to treatment, examination of patient, obtaining history from patient or surrogate, ordering and performing treatments and interventions, ordering and review of laboratory studies, ordering and review of radiographic studies, pulse oximetry and re-evaluation of patient's condition.         Final Clinical Impression(s) / ED Diagnoses Final diagnoses:  None    Rx / DC Orders ED Discharge Orders     None         Lorre Nick, MD 10/09/22 2036

## 2022-10-09 NOTE — Assessment & Plan Note (Signed)
Replace K+

## 2022-10-09 NOTE — Assessment & Plan Note (Signed)
Seems to be healing. No cellulitis this time (in contrast to last month) See photos from earlier today at [facility] office.

## 2022-10-10 DIAGNOSIS — Z7189 Other specified counseling: Secondary | ICD-10-CM | POA: Diagnosis not present

## 2022-10-10 DIAGNOSIS — E1140 Type 2 diabetes mellitus with diabetic neuropathy, unspecified: Secondary | ICD-10-CM | POA: Diagnosis present

## 2022-10-10 DIAGNOSIS — J9611 Chronic respiratory failure with hypoxia: Secondary | ICD-10-CM | POA: Diagnosis not present

## 2022-10-10 DIAGNOSIS — Z515 Encounter for palliative care: Secondary | ICD-10-CM

## 2022-10-10 DIAGNOSIS — Z79890 Hormone replacement therapy (postmenopausal): Secondary | ICD-10-CM | POA: Diagnosis not present

## 2022-10-10 DIAGNOSIS — I10 Essential (primary) hypertension: Secondary | ICD-10-CM | POA: Diagnosis present

## 2022-10-10 DIAGNOSIS — Z9981 Dependence on supplemental oxygen: Secondary | ICD-10-CM | POA: Diagnosis not present

## 2022-10-10 DIAGNOSIS — Z833 Family history of diabetes mellitus: Secondary | ICD-10-CM | POA: Diagnosis not present

## 2022-10-10 DIAGNOSIS — E7800 Pure hypercholesterolemia, unspecified: Secondary | ICD-10-CM | POA: Diagnosis present

## 2022-10-10 DIAGNOSIS — E039 Hypothyroidism, unspecified: Secondary | ICD-10-CM | POA: Diagnosis present

## 2022-10-10 DIAGNOSIS — Z881 Allergy status to other antibiotic agents status: Secondary | ICD-10-CM | POA: Diagnosis not present

## 2022-10-10 DIAGNOSIS — G4733 Obstructive sleep apnea (adult) (pediatric): Secondary | ICD-10-CM | POA: Diagnosis present

## 2022-10-10 DIAGNOSIS — F319 Bipolar disorder, unspecified: Secondary | ICD-10-CM | POA: Diagnosis present

## 2022-10-10 DIAGNOSIS — J441 Chronic obstructive pulmonary disease with (acute) exacerbation: Principal | ICD-10-CM

## 2022-10-10 DIAGNOSIS — E876 Hypokalemia: Secondary | ICD-10-CM | POA: Diagnosis present

## 2022-10-10 DIAGNOSIS — L97519 Non-pressure chronic ulcer of other part of right foot with unspecified severity: Secondary | ICD-10-CM | POA: Diagnosis present

## 2022-10-10 DIAGNOSIS — F0393 Unspecified dementia, unspecified severity, with mood disturbance: Secondary | ICD-10-CM | POA: Diagnosis present

## 2022-10-10 DIAGNOSIS — E11621 Type 2 diabetes mellitus with foot ulcer: Secondary | ICD-10-CM | POA: Diagnosis present

## 2022-10-10 DIAGNOSIS — Z91013 Allergy to seafood: Secondary | ICD-10-CM | POA: Diagnosis not present

## 2022-10-10 DIAGNOSIS — Z8249 Family history of ischemic heart disease and other diseases of the circulatory system: Secondary | ICD-10-CM | POA: Diagnosis not present

## 2022-10-10 DIAGNOSIS — Z888 Allergy status to other drugs, medicaments and biological substances status: Secondary | ICD-10-CM | POA: Diagnosis not present

## 2022-10-10 DIAGNOSIS — Z1152 Encounter for screening for COVID-19: Secondary | ICD-10-CM | POA: Diagnosis not present

## 2022-10-10 DIAGNOSIS — J439 Emphysema, unspecified: Secondary | ICD-10-CM | POA: Diagnosis present

## 2022-10-10 DIAGNOSIS — F17210 Nicotine dependence, cigarettes, uncomplicated: Secondary | ICD-10-CM | POA: Diagnosis present

## 2022-10-10 DIAGNOSIS — E871 Hypo-osmolality and hyponatremia: Secondary | ICD-10-CM | POA: Diagnosis present

## 2022-10-10 LAB — HIV ANTIBODY (ROUTINE TESTING W REFLEX): HIV Screen 4th Generation wRfx: NONREACTIVE

## 2022-10-10 LAB — GLUCOSE, CAPILLARY
Glucose-Capillary: 94 mg/dL (ref 70–99)
Glucose-Capillary: 146 mg/dL — ABNORMAL HIGH (ref 70–99)
Glucose-Capillary: 193 mg/dL — ABNORMAL HIGH (ref 70–99)
Glucose-Capillary: 171 mg/dL — ABNORMAL HIGH (ref 70–99)

## 2022-10-10 LAB — BASIC METABOLIC PANEL
Anion gap: 10 (ref 5–15)
BUN: 6 mg/dL (ref 6–20)
CO2: 32 mmol/L (ref 22–32)
Calcium: 9 mg/dL (ref 8.9–10.3)
Chloride: 87 mmol/L — ABNORMAL LOW (ref 98–111)
Creatinine, Ser: 0.72 mg/dL (ref 0.44–1.00)
GFR, Estimated: >60 mL/min (ref >60)
Glucose, Bld: 84 mg/dL (ref 70–99)
Potassium: 3.7 mmol/L (ref 3.5–5.1)
Sodium: 129 mmol/L — ABNORMAL LOW (ref 135–145)

## 2022-10-10 MED ORDER — VITAMIN C 500 MG PO TABS
500.0000 mg | ORAL_TABLET | Freq: Two times a day (BID) | ORAL | Status: DC
  Administered 2022-10-10 – 2022-10-12 (×5): 500 mg via ORAL
  Filled 2022-10-10 (×5): qty 1

## 2022-10-10 MED ORDER — ACETAMINOPHEN 325 MG PO TABS: 650.0000 mg | ORAL_TABLET | Freq: Four times a day (QID) | ORAL | Status: DC | PRN

## 2022-10-10 MED ORDER — ZINC SULFATE 220 (50 ZN) MG PO CAPS
220.0000 mg | ORAL_CAPSULE | Freq: Every day | ORAL | Status: DC
  Administered 2022-10-10 – 2022-10-12 (×3): 220 mg via ORAL
  Filled 2022-10-10 (×3): qty 1

## 2022-10-10 MED ORDER — ENSURE MAX PROTEIN PO LIQD
11.0000 [oz_av] | Freq: Two times a day (BID) | ORAL | Status: DC
  Administered 2022-10-10 – 2022-10-12 (×5): 11 [oz_av] via ORAL
  Filled 2022-10-10 (×5): qty 330

## 2022-10-10 MED ORDER — ALBUTEROL SULFATE (2.5 MG/3ML) 0.083% IN NEBU
2.5000 mg | INHALATION_SOLUTION | Freq: Three times a day (TID) | RESPIRATORY_TRACT | Status: DC
  Administered 2022-10-10 – 2022-10-12 (×7): 2.5 mg via RESPIRATORY_TRACT
  Filled 2022-10-10 (×7): qty 3

## 2022-10-10 MED ORDER — ADULT MULTIVITAMIN W/MINERALS CH
1.0000 | ORAL_TABLET | Freq: Every day | ORAL | Status: DC
  Administered 2022-10-10 – 2022-10-12 (×3): 1 via ORAL
  Filled 2022-10-10 (×3): qty 1

## 2022-10-10 NOTE — Progress Notes (Signed)
   10/10/22 2228  BiPAP/CPAP/SIPAP  BiPAP/CPAP/SIPAP Pt Type Adult  BiPAP/CPAP/SIPAP DREAMSTATIOND  Mask Type Nasal mask (per pt home regimen)  Mask Size Small  Respiratory Rate 20 breaths/min  EPAP 10 cmH2O (home settings per pt)  Flow Rate 3 lpm  Patient Home Equipment No  Auto Titrate No  CPAP/SIPAP surface wiped down Yes  BiPAP/CPAP /SiPAP Vitals  Pulse Rate 75  Resp 20  SpO2 96 %

## 2022-10-10 NOTE — Progress Notes (Signed)
PROGRESS NOTE    Joan [name]  TDD:[id number] DOB: 1960-12-20 DOA: 10/09/2022 PCP: Myrlene Broker, MD   Brief Narrative: Joan [name] is a 62 y.o. female with a history of end-stage COPD currently receiving hospice care, chronic respiratory failure on oxygen.  Patient presented secondary to shortness of breath and was found to have evidence of a COPD exacerbation.  Patient started on steroids, empiric antibiotics, breathing treatments.   Assessment and Plan:  COPD with acute exacerbation End-stage COPD Unclear etiology for acute illness, but patient continues to smoke which may be contributory.  COVID-19 testing negative.  Patient on home oxygen.  Patient started on ceftriaxone, Solu-Medrol, scheduled albuterol, in addition to continuing steroid inhaler, anticholinergic inhaler, Singulair. -Continue ceftriaxone, Solu-Medrol, albuterol, Incruse Ellipta, Dulera, Singulair  Chronic respiratory failure with hypoxia Patient uses 3 L/min of oxygen as an outpatient.  Currently stable.  Right foot ulcer Chronic.  No evidence of acute infection.  Hypokalemia Potassium of 2.6 on admission.  Resolved with potassium supplementation.  Hyponatremia Sodium of 127 on admission.  Possibly related to the use of hydrochlorothiazide.  Has improved slightly to 129 with IV fluids.  Asymptomatic.  Diabetes mellitus type 2 Controlled with hemoglobin A1c of 6.8%.  Patient is not on medication management as an outpatient.  Patient started on SSI and carb modified diet for inpatient.  Obstructive sleep apnea Patient uses CPAP as an outpatient. -Continue CPAP nightly  Primary hypertension Patient is on atenolol and hydrochlorothiazide as an outpatient.  Hydrochlorothiazide held secondary to hyponatremia. -Continue atenolol    DVT prophylaxis: Lovenox Code Status:   Code Status: Full Code Family Communication: None at bedside Disposition Plan: Discharge home likely in 24  hours   Consultants:  None  Procedures:  None  Antimicrobials: Ceftriaxone IV   Subjective: Patient is breathing better but states she is not yet at baseline. She confirms hospice care at home.  Objective: BP 120/72 (BP Location: Right Arm)   Pulse 64   Temp 97.7 F (36.5 C) (Oral)   Resp 19   Ht 5\' 8"  (1.727 m)   Wt 88.5 kg   SpO2 98%   BMI 29.65 kg/m   Examination:  General exam: Appears calm and comfortable Respiratory system: Significant diffuse wheezing with diminished breath sounds. Respiratory effort normal. Cardiovascular system: S1 & S2 heard, RRR. No murmurs, rubs, gallops or clicks. Gastrointestinal system: Abdomen is nondistended, soft and nontender. Normal bowel sounds heard. Central nervous system: Alert and oriented. No focal neurological deficits. Psychiatry: Judgement and insight appear normal. Mood & affect appropriate.    Data Reviewed: I have personally reviewed following labs and imaging studies  CBC Lab Results  Component Value Date   WBC 8.8 10/09/2022   RBC 3.78 (L) 10/09/2022   HGB 11.0 (L) 10/09/2022   HCT 32.6 (L) 10/09/2022   MCV 86.2 10/09/2022   MCH 29.1 10/09/2022   PLT 298 10/09/2022   MCHC 33.7 10/09/2022   RDW 15.4 10/09/2022   LYMPHSABS 1.7 10/09/2022   MONOABS 0.6 10/09/2022   EOSABS 0.2 10/09/2022   BASOSABS 0.1 10/09/2022     Last metabolic panel Lab Results  Component Value Date   NA 129 (L) 10/10/2022   K 3.7 10/10/2022   CL 87 (L) 10/10/2022   CO2 32 10/10/2022   BUN 6 10/10/2022   CREATININE 0.72 10/10/2022   GLUCOSE 84 10/10/2022   GFRNONAA >60 10/10/2022   GFRAA 59 (L) 10/28/2019   CALCIUM 9.0 10/10/2022   PHOS 4.5  09/11/2022   PROT 5.8 (L) 09/11/2022   ALBUMIN 3.2 (L) 09/11/2022   BILITOT 0.4 09/11/2022   ALKPHOS 99 09/11/2022   AST 18 09/11/2022   ALT 21 09/11/2022   ANIONGAP 10 10/10/2022    GFR: Estimated Creatinine Clearance: 87 mL/min (by C-G formula based on SCr of 0.72  mg/dL).  Recent Results (from the past 240 hour(s))  SARS Coronavirus 2 by RT PCR (hospital order, performed in [facility] hospital lab) *cepheid single result test* Anterior Nasal Swab     Status: None   Collection Time: 10/09/22  7:10 PM   Specimen: Anterior Nasal Swab  Result Value Ref Range Status   SARS Coronavirus 2 by RT PCR NEGATIVE NEGATIVE Final    Comment: (NOTE) SARS-CoV-2 target nucleic acids are NOT DETECTED.  The SARS-CoV-2 RNA is generally detectable in upper and lower respiratory specimens during the acute phase of infection. The lowest concentration of SARS-CoV-2 viral copies this assay can detect is 250 copies / mL. A negative result does not preclude SARS-CoV-2 infection and should not be used as the sole basis for treatment or other patient management decisions.  A negative result may occur with improper specimen collection / handling, submission of specimen other than nasopharyngeal swab, presence of viral mutation(s) within the areas targeted by this assay, and inadequate number of viral copies (<250 copies / mL). A negative result must be combined with clinical observations, patient history, and epidemiological information.  Fact Sheet for Patients:   RoadLapTop.co.za  Fact Sheet for Healthcare Providers: http://kim-miller.com/  This test is not yet approved or  cleared by the [place] FDA and has been authorized for detection and/or diagnosis of SARS-CoV-2 by FDA under an Emergency Use Authorization (EUA).  This EUA will remain in effect (meaning this test can be used) for the duration of the COVID-19 declaration under Section 564(b)(1) of the Act, 21 U.S.C. section 360bbb-3(b)(1), unless the authorization is terminated or revoked sooner.  Performed at [facility], [address], [place], [place] [address]       Radiology Studies: [facility] Chest Port 1 View  Result Date:  10/09/2022 CLINICAL DATA:  SOB (shortness of breath) EXAM: PORTABLE CHEST 1 VIEW COMPARISON:  CXR 09/14/22 FINDINGS: Small left pleural effusion. No pneumothorax. Normal cardiac and mediastinal contours. Hazy opacity at the left lung base favored to represent atelectasis. No radiographically apparent displaced rib fractures. Visualized upper abdomen is unremarkable. IMPRESSION: Likely trace left pleural effusion with superimposed airspace opacity that could represent atelectasis or infection. Electronically Signed   By: Lorenza Cambridge M.D.   On: 10/09/2022 18:50   DG Foot Complete Right  Result Date: 10/09/2022 Please see detailed radiograph report in office note.     LOS: 0 days    Jacquelin Hawking, MD [facility] 10/10/2022, 7:44 AM   If 7PM-7AM, please contact night-coverage [email or web address]

## 2022-10-10 NOTE — TOC Initial Note (Signed)
Transition of Care [facility]) - Initial/Assessment Note    Patient Details  Name: Joan [name] MRN: [id number] Date of Birth: [birth year]  Transition of Care [facility]) CM/SW Contact:    Otelia Santee, LCSW Phone Number: 10/10/2022, 3:04 PM  Clinical Narrative:                 Pt currently active with [facility].  TOC will follow.   Expected Discharge Plan: Home w Hospice Care Barriers to Discharge: No Barriers Identified   Patient Goals and CMS Choice Patient states their goals for this hospitalization and ongoing recovery are:: To find placement CMS Medicare.gov Compare Post Acute Care list provided to:: Patient Choice offered to / list presented to : Patient [place] ownership interest in [facility].provided to:: Patient    Expected Discharge Plan and Services In-house Referral: NA Discharge Planning Services: NA Post Acute Care Choice: Resumption of Svcs/PTA Provider, Hospice Living arrangements for the past 2 months: Apartment                 DME Arranged: N/A DME Agency: NA                  Prior Living Arrangements/Services Living arrangements for the past 2 months: Apartment Lives with:: Self Patient language and need for interpreter reviewed:: Yes Do you feel safe going back to the place where you live?: Yes      Need for Family Participation in Patient Care: No (Comment) Care giver support system in place?: Yes (comment) Current home services: Hospice Criminal Activity/Legal Involvement Pertinent to Current Situation/Hospitalization: No - Comment as needed  Activities of Daily Living Home Assistive Devices/Equipment: Wheelchair ADL Screening (condition at time of admission) Patient's cognitive ability adequate to safely complete daily activities?: No Is the patient deaf or have difficulty hearing?: Yes Does the patient have difficulty seeing, even when wearing glasses/contacts?: No Does the patient have difficulty  concentrating, remembering, or making decisions?: No Patient able to express need for assistance with ADLs?: Yes Does the patient have difficulty dressing or bathing?: No Independently performs ADLs?: Yes (appropriate for developmental age) Does the patient have difficulty walking or climbing stairs?: Yes Weakness of Legs: Both Weakness of Arms/Hands: None  Permission Sought/Granted   Permission granted to share information with : No              Emotional Assessment Appearance:: Appears stated age Attitude/Demeanor/Rapport: Engaged Affect (typically observed): Accepting, Pleasant Orientation: : Oriented to Self, Oriented to Place, Oriented to  Time, Oriented to Situation Alcohol / Substance Use: Not Applicable Psych Involvement: No (comment)  Admission diagnosis:  COPD exacerbation (HCC) [J44.1] Patient Active Problem List   Diagnosis Date Noted   Counseling and coordination of care 10/10/2022   Goals of care, counseling/discussion 10/10/2022   Palliative care encounter 10/10/2022   Ulcer of right foot (HCC) 10/09/2022   Cellulitis of right lower extremity 09/11/2022   Obesity (BMI 30-39.9) 09/11/2022   Cellulitis of right foot 09/08/2022   Itching 08/14/2022   PAD (peripheral artery disease) (HCC) 08/14/2022   Cervical radiculopathy 02/14/2022   Chronic left shoulder pain 11/26/2021   Chronic pain of left thumb 11/26/2021   Chronic obstructive pulmonary disease (HCC) 11/17/2021   De Quervain's tenosynovitis, left 11/17/2021   Needs assistance with community resources 11/17/2021   Neuropathy due to type 2 diabetes mellitus (HCC) 10/12/2021   Atypical chest pain 04/17/2021   Hypokalemia 04/03/2021   COPD (chronic obstructive pulmonary disease) with  emphysema (HCC) 04/03/2021   COPD with acute exacerbation (HCC) 04/02/2021   Acquired hypothyroidism    Controlled type 2 diabetes mellitus with neuropathy (HCC)    Generalized abdominal pain 03/28/2021   Depression  08/16/2020   PTSD (post-traumatic stress disorder) 02/09/2020   Mood disorder in conditions classified elsewhere 02/09/2020   Generalized anxiety disorder 02/09/2020   Coarse tremors 10/28/2019   Chronic rhinitis 10/21/2019   Allergic rhinitis 06/16/2019   Cervical spondylosis without myelopathy 12/18/2018   Hypertension    Encounter for general adult medical examination with abnormal findings 11/06/2018   Smokers' cough (HCC) 01/15/2018   Pulmonary nodule 11/11/2017   Carotid artery disease (HCC) 11/05/2017   OSA on CPAP 08/07/2017   GERD (gastroesophageal reflux disease) 06/23/2017   Hypothyroidism due to acquired atrophy of thyroid 04/05/2017   Primary osteoarthritis involving multiple joints 04/05/2017   Chronic constipation 02/10/2017   Iron deficiency anemia due to dietary causes 02/10/2017   Osteoarthritis 02/10/2017   Mixed hyperlipidemia 02/10/2017   Chronic respiratory failure with hypoxia (HCC) 12/16/2016   Essential hypertension 11/29/2016   Vocal cord dysfunction    Anxiety 09/24/2016   PCP:  Myrlene Broker, MD Pharmacy:   [facility] - [place], [place] - [address] [place] [address] Phone: [phone] Fax: [phone]  [id number] - [place], [place] - [address] [place] [address] Phone: [phone] Fax: [phone]     Social Determinants of Health (SDOH) Social History: SDOH Screenings   Food Insecurity: No Food Insecurity (10/09/2022)  Recent Concern: Food Insecurity - Food Insecurity Present (08/12/2022)  Housing: Low Risk  (10/09/2022)  Transportation Needs: No Transportation Needs (10/09/2022)  Recent Concern: Transportation Needs - Unmet Transportation Needs (08/30/2022)  Utilities: Not At Risk (10/09/2022)  Alcohol Screen: Low Risk  (08/30/2022)  Depression (PHQ2-9): Low Risk  (08/30/2022)  Financial Resource Strain: High Risk (08/30/2022)  Physical  Activity: Inactive (08/30/2022)  Social Connections: Moderately Isolated (08/30/2022)  Stress: No Stress Concern Present (08/30/2022)  Recent Concern: Stress - Stress Concern Present (08/12/2022)  Tobacco Use: High Risk (10/09/2022)  Health Literacy: Low Risk  (02/18/2022)   [facility] from [facility]   SDOH Interventions:     Readmission Risk Interventions    09/13/2022   10:53 AM  Readmission Risk Prevention Plan  Transportation Screening Complete  HRI or Home Care Consult Complete  Social Work Consult for Recovery Care Planning/Counseling Complete  Palliative Care Screening Not Applicable  Medication Review Oceanographer) Complete

## 2022-10-10 NOTE — Hospital Course (Addendum)
Joan [name] is a 62 y.o. female with a history of end-stage COPD currently receiving hospice care, chronic respiratory failure on oxygen.  Patient presented secondary to shortness of breath and was found to have evidence of a COPD exacerbation.  Patient started on steroids, empiric antibiotics, breathing treatments with improvement of symptoms.

## 2022-10-10 NOTE — Evaluation (Signed)
Occupational Therapy Evaluation/discharge Patient Details Name: Joan [name] MRN: [id number] DOB: 21-Nov-1960 Today's Date: 10/10/2022   History of Present Illness 62 y.o. female with medical history significant of end stage COPD.  Pt on 3L home O2 at baseline, is active with hospice though remains a full code. Complains of increased SOB.   Clinical Impression   Patient evaluated by Occupational Therapy with no further acute OT needs identified. All education has been completed and the patient has no further questions. Pt is currently functioning at baseline. Receives assistance for LB bathing/dressing.  No follow-up Occupational Therapy or equipment needs. Recommend pt return home and continue to receive hospice care. OT is signing off. Thank you for this referral.        If plan is discharge home, recommend the following: A little help with bathing/dressing/bathroom    Functional Status Assessment  Patient has not had a recent decline in their functional status  Equipment Recommendations  None recommended by OT       Precautions / Restrictions Precautions Precautions: Other (comment) Precaution Comments: COPD, left foot wound Restrictions Weight Bearing Restrictions: No      Mobility Bed Mobility Overal bed mobility: Modified Independent    General bed mobility comments: HOB elevated. Bed rail used. No physical assist required to complete bed mobility.    Transfers Overall transfer level: Needs assistance Equipment used: None Transfers: Sit to/from Stand Sit to Stand: Supervision    General transfer comment: Provided SBA for safety although no physical required or LOB noted. Pt able to take lateral steps to the right towards HOB unassisted.      Balance Overall balance assessment: No apparent balance deficits (not formally assessed)        ADL either performed or assessed with clinical judgement   ADL Overall ADL's : At baseline     General ADL Comments:  Able to complete UB bathing and grooming tasks without assistance. Assistance required for LB ADL tasks such as bathing and dressing.     Vision Baseline Vision/History: 1 Wears glasses Ability to See in Adequate Light: 0 Adequate Patient Visual Report: No change from baseline Vision Assessment?: No apparent visual deficits     Perception Perception: Within Functional Limits       Praxis Praxis: WFL       Pertinent Vitals/Pain Pain Assessment Pain Assessment: No/denies pain     Extremity/Trunk Assessment Upper Extremity Assessment Upper Extremity Assessment: Overall WFL for tasks assessed   Lower Extremity Assessment Lower Extremity Assessment: Defer to PT evaluation   Cervical / Trunk Assessment Cervical / Trunk Assessment: Normal   Communication Communication Communication: No apparent difficulties   Cognition Arousal: Lethargic (increased alertness when seated EOB) Behavior During Therapy: WFL for tasks assessed/performed Overall Cognitive Status: Within Functional Limits for tasks assessed         General Comments  O2 on during session at 2L               Prior Functioning/Environment Prior Level of Function : Needs assist       Physical Assist : ADLs (physical)   ADLs (physical): Bathing;Dressing Mobility Comments: Pt uses manual WC in community. No device for mobility in the home. ADLs Comments: Aide assists 3X/wk with bathing/dressing. Pt requires assist for LB ADLs. Has modified tub/shower with shower seat.        OT Problem List: Decreased activity tolerance         OT Goals(Current goals can be found in the care plan  section) Acute Rehab OT Goals Patient Stated Goal: to stay in bed  OT Frequency:  1X visit       AM-PAC OT "6 Clicks" Daily Activity     Outcome Measure Help from another person eating meals?: None Help from another person taking care of personal grooming?: None Help from another person toileting, which includes using  toliet, bedpan, or urinal?: None Help from another person bathing (including washing, rinsing, drying)?: A Little Help from another person to put on and taking off regular upper body clothing?: None Help from another person to put on and taking off regular lower body clothing?: A Little 6 Click Score: 22   End of Session Equipment Utilized During Treatment: Oxygen  Activity Tolerance: Patient limited by fatigue Patient left: in bed;with call bell/phone within reach;with family/visitor present  OT Visit Diagnosis: Muscle weakness (generalized) (M62.81)                Time: 8295-6213 OT Time Calculation (min): 26 min Charges:  OT General Charges $OT Visit: 1 Visit OT Evaluation $OT Eval Low Complexity: 1 Low  Limmie Patricia, OTR/L,CBIS  Supplemental OT - [facility] and [facility] Secure Chat Preferred    , Charisse March 10/10/2022, 10:50 AM

## 2022-10-10 NOTE — Plan of Care (Signed)
  Problem: Fluid Volume: Goal: Ability to maintain a balanced intake and output will improve Outcome: Progressing   Problem: Metabolic: Goal: Ability to maintain appropriate glucose levels will improve Outcome: Progressing   Problem: Nutritional: Goal: Maintenance of adequate nutrition will improve Outcome: Progressing   Problem: Skin Integrity: Goal: Risk for impaired skin integrity will decrease Outcome: Progressing   Problem: Clinical Measurements: Goal: Ability to maintain clinical measurements within normal limits will improve Outcome: Progressing   Problem: Pain Managment: Goal: General experience of comfort will improve Outcome: Progressing   Problem: Safety: Goal: Ability to remain free from injury will improve Outcome: Progressing

## 2022-10-10 NOTE — Consult Note (Addendum)
Consultation Note Date: 10/10/2022   Patient Name: Joan [name]  DOB: 11-28-1960  MRN: [id number]  Age / Sex: 62 y.o., female   PCP: Myrlene Broker, MD Referring Physician: Narda Bonds, MD  Reason for Consultation: Establishing goals of care     Chief Complaint/History of Present Illness:   Patient is a 62 year old female with a past medical history of end-stage COPD currently receiving hospice care at home via an [facility], diabetes mellitus type 2, obstructive sleep apnea, hypertension, and chronic respiratory failure on oxygen who was admitted on 10/09/2022 for management of shortness of breath.  During admission patient found to have evidence of COPD exacerbation.  Palliative medicine team consulted to assist with complex medical decision making. Of note patient seen by palliative medicine provider, Aurelio Jew, NP, at [facility] during hospitalization less than a month ago in July.  Extensive review of EMR prior to presenting to bedside.  Discussed care with prior palliative medicine team provider to coordinate care.  Presented to bedside to see patient.  Patient laying in bed.  No family present at bedside.  Introduced myself as a member of the palliative medicine team and my role in patient's care.  Patient welcoming visit though during multiple attempts to engage in discussion, patient rapidly falling asleep.  Patient states that she feels very tired and it is difficult for her to keep her eyes open.  Acknowledged this and noted would discussed with hospitalist.  Patient was able to answer that she is at home with [facility] hospice support.  Not able to engage in further goals of care conversation or CODE STATUS conversation due to patient's lethargy during our conversation.  Noted palliative medicine team would continue to engage as able.  Discussed care with IDT including hospitalist after visit with patient.  Primary Diagnoses  Present on Admission:  Hypokalemia  COPD with  acute exacerbation (HCC)  Controlled type 2 diabetes mellitus with neuropathy (HCC)  Chronic respiratory failure with hypoxia (HCC)  (Resolved) COPD exacerbation (HCC)  Essential hypertension  Ulcer of right foot (HCC)   Palliative Review of Systems: Fatigue  Past Medical History:  Diagnosis Date   Acute respiratory failure with hypoxia (HCC)    Alcoholism and drug addiction in family    Anxiety    Appendicitis    Arthritis    "knees, hips, lower back" (09/25/2016)   Asthma    Bipolar disorder (HCC)    Chronic bronchitis (HCC)    "all the time" (09/25/2016)   Chronic leg pain    RLE   Chronic lower back pain    COPD (chronic obstructive pulmonary disease) (HCC)    Depression    Diabetes mellitus without complication (HCC)    Diabetic neuropathy (HCC)    Early cataracts, bilateral    Emphysema of lung (HCC)    Generalized anxiety disorder 02/09/2020   GERD (gastroesophageal reflux disease)    Headache    Hepatitis C    "treated back in 2001-2002"   High cholesterol    Hypertension    Hypothyroidism    Incarcerated ventral hernia 04/04/2017   Mood disorder in conditions classified elsewhere 02/09/2020   On home oxygen therapy    "2L; at nighttime" (04/04/2017)   OSA on CPAP    CPAP with Oxygen 3 liters   Paranoid (HCC)    Pneumonia    "all the time" (09/25/2016)   Positive PPD    with negative chest x ray   PTSD (post-traumatic stress disorder) 02/09/2020  Tachycardia    patient reports with exertion ; denies any other conditions    Thyroid disease    Wears glasses    Wears partial dentures    Social History   Socioeconomic History   Marital status: Widowed    Spouse name: Not on file   Number of children: 3   Years of education: Not on file   Highest education level: 9th grade  Occupational History   Not on file  Tobacco Use   Smoking status: Every Day    Current packs/day: 2.00    Average packs/day: 2.0 packs/day for 47.6 years (95.2 ttl pk-yrs)     Types: Cigarettes    Start date: 1977    Passive exposure: Current   Smokeless tobacco: Never   Tobacco comments:    1ppd as of 07/30/22 ep  Vaping Use   Vaping status: Never Used  Substance and Sexual Activity   Alcohol use: Not Currently   Drug use: Not Currently    Types: "Crack" cocaine    Comment:  "nothing since 01/12/2012"   Sexual activity: Not Currently    Partners: Male  Other Topics Concern   Not on file  Social History Narrative   Not on file   Social Determinants of Health   Financial Resource Strain: High Risk (08/30/2022)   Overall Financial Resource Strain (CARDIA)    Difficulty of Paying Living Expenses: Hard  Food Insecurity: No Food Insecurity (10/09/2022)   Hunger Vital Sign    Worried About Running Out of Food in the Last Year: Never true    Ran Out of Food in the Last Year: Never true  Recent Concern: Food Insecurity - Food Insecurity Present (08/12/2022)   Hunger Vital Sign    Worried About Running Out of Food in the Last Year: Sometimes true    Ran Out of Food in the Last Year: Sometimes true  Transportation Needs: No Transportation Needs (10/09/2022)   PRAPARE - Administrator, Civil Service (Medical): No    Lack of Transportation (Non-Medical): No  Recent Concern: Transportation Needs - Unmet Transportation Needs (08/30/2022)   PRAPARE - Transportation    Lack of Transportation (Medical): Yes    Lack of Transportation (Non-Medical): Yes  Physical Activity: Inactive (08/30/2022)   Exercise Vital Sign    Days of Exercise per Week: 0 days    Minutes of Exercise per Session: 0 min  Stress: No Stress Concern Present (08/30/2022)   [employer] of Occupational Health - Occupational Stress Questionnaire    Feeling of Stress : Only a little  Recent Concern: Stress - Stress Concern Present (08/12/2022)   [employer] of Occupational Health - Occupational Stress Questionnaire    Feeling of Stress : Very much  Social Connections: Moderately  Isolated (08/30/2022)   Social Connection and Isolation Panel [NHANES]    Frequency of Communication with Friends and Family: Once a week    Frequency of Social Gatherings with Friends and Family: Once a week    Attends Religious Services: 1 to 4 times per year    Active Member of [employer] or Organizations: Yes    Attends Banker Meetings: 1 to 4 times per year    Marital Status: Widowed   Family History  Problem Relation Age of Onset   Diabetes Mother    Hypertension Mother    Hypertension Father    Cancer Father        lung   Breast cancer Neg Hx  Scheduled Meds:  albuterol  2.5 mg Nebulization TID   atenolol  25 mg Oral Daily   atorvastatin  20 mg Oral Daily   buPROPion  300 mg Oral Daily   cyclobenzaprine  10 mg Oral QHS   diphenhydrAMINE  25 mg Oral BID   enoxaparin (LOVENOX) injection  40 mg Subcutaneous Q24H   insulin aspart  0-15 Units Subcutaneous TID WC   insulin aspart  0-5 Units Subcutaneous QHS   levothyroxine  25 mcg Oral QAC breakfast   methylPREDNISolone (SOLU-MEDROL) injection  40 mg Intravenous Q12H   mometasone-formoterol  2 puff Inhalation BID   And   umeclidinium bromide  1 puff Inhalation Daily   montelukast  10 mg Oral QHS   naproxen  500 mg Oral BID WC   olopatadine  1 drop Both Eyes BID   pantoprazole  40 mg Oral Daily   pregabalin  300 mg Oral BID   QUEtiapine  100 mg Oral QHS   roflumilast  500 mcg Oral Daily   silver sulfADIAZINE  1 Application Topical Daily   traZODone  100 mg Oral QHS   venlafaxine XR  150 mg Oral Q breakfast   Continuous Infusions:  cefTRIAXone (ROCEPHIN)  IV     lactated ringers 125 mL/hr at 10/10/22 0901   PRN Meds:.albuterol, guaiFENesin-Codeine, melatonin, ondansetron (ZOFRAN) IV, polyethylene glycol Allergies  Allergen Reactions   Keflex [Cephalexin] Rash and Other (See Comments)    Has tolerated amoxicillin since had keflex   Shellfish Allergy Shortness Of Breath, Nausea And Vomiting and Other (See  Comments)    Stomach cramps   Tetracyclines & Related Anaphylaxis, Swelling and Other (See Comments)    Throat swelling requiring hospitalization   Dilaudid [Hydromorphone Hcl] Other (See Comments)    "Lethargy"   Prednisone Other (See Comments)    "counter reacts", has tolerated IV medrol   Clindamycin/Lincomycin Other (See Comments)    Burning sensation in her mouth down her throat    Incruse Ellipta [Umeclidinium Bromide] Nausea Only   Molds & Smuts Itching   Tuberculin Tests Other (See Comments)    False postive   Umeclidinium     Other Reaction(s): GI Intolerance   CBC:    Component Value Date/Time   WBC 8.8 10/09/2022 1910   HGB 11.0 (L) 10/09/2022 1910   HGB 13.8 12/09/2018 1133   HCT 32.6 (L) 10/09/2022 1910   HCT 39.5 12/09/2018 1133   PLT 298 10/09/2022 1910   PLT 295 12/09/2018 1133   MCV 86.2 10/09/2022 1910   MCV 91 12/09/2018 1133   NEUTROABS 6.3 10/09/2022 1910   NEUTROABS 5 02/11/2017 0000   LYMPHSABS 1.7 10/09/2022 1910   MONOABS 0.6 10/09/2022 1910   EOSABS 0.2 10/09/2022 1910   BASOSABS 0.1 10/09/2022 1910   Comprehensive Metabolic Panel:    Component Value Date/Time   NA 129 (L) 10/10/2022 0024   NA 140 12/09/2018 1133   K 3.7 10/10/2022 0024   CL 87 (L) 10/10/2022 0024   CO2 32 10/10/2022 0024   BUN 6 10/10/2022 0024   BUN 18 12/09/2018 1133   CREATININE 0.72 10/10/2022 0024   CREATININE 1.18 (H) 10/28/2019 1631   GLUCOSE 84 10/10/2022 0024   CALCIUM 9.0 10/10/2022 0024   AST 18 09/11/2022 0659   ALT 21 09/11/2022 0659   ALKPHOS 99 09/11/2022 0659   BILITOT 0.4 09/11/2022 0659   BILITOT 0.3 08/19/2017 0822   PROT 5.8 (L) 09/11/2022 0659   PROT 6.5 08/19/2017 4034  ALBUMIN 3.2 (L) 09/11/2022 0659   ALBUMIN 4.5 08/19/2017 9147    Physical Exam: Vital Signs: BP 120/72 (BP Location: Right Arm)   Pulse 64   Temp 97.7 F (36.5 C) (Oral)   Resp 19   Ht 5\' 8"  (1.727 m)   Wt 88.5 kg   SpO2 99%   BMI 29.65 kg/m  SpO2: SpO2: 99 % O2  Device: O2 Device: Nasal Cannula O2 Flow Rate: O2 Flow Rate (L/min): 3 L/min Intake/output summary:  Intake/Output Summary (Last 24 hours) at 10/10/2022 0913 Last data filed at 10/09/2022 2236 Gross per 24 hour  Intake 244.44 ml  Output --  Net 244.44 ml   LBM: Last BM Date :  (PTA) Baseline Weight: Weight: 88.5 kg Most recent weight: Weight: 88.5 kg  General: NAD, lethargic, chronically ill appearing  Eyes: No drainage noted HENT: Dry mucous membranes Cardiovascular: RRR Respiratory: no increased work of breathing noted, not in respiratory distress Abdomen: not distended Neuro: Lethargic          Palliative Performance Scale: 50%              Additional Data Reviewed: Recent Labs    10/09/22 1910 10/10/22 0024  WBC 8.8  --   HGB 11.0*  --   PLT 298  --   NA 127* 129*  BUN 7 6  CREATININE 0.76 0.72    Imaging: DG Chest Port 1 View CLINICAL DATA:  SOB (shortness of breath)  EXAM: PORTABLE CHEST 1 VIEW  COMPARISON:  CXR 09/14/22  FINDINGS: Small left pleural effusion. No pneumothorax. Normal cardiac and mediastinal contours. Hazy opacity at the left lung base favored to represent atelectasis. No radiographically apparent displaced rib fractures. Visualized upper abdomen is unremarkable.  IMPRESSION: Likely trace left pleural effusion with superimposed airspace opacity that could represent atelectasis or infection.  Electronically Signed   By: Lorenza Cambridge M.D.   On: 10/09/2022 18:50 DG Foot Complete Right Please see detailed radiograph report in office note.    I personally reviewed recent imaging.   Palliative Care Assessment and Plan Summary of Established Goals of Care and Medical Treatment Preferences   Patient is a 62 year old female with a past medical history of end-stage COPD currently receiving hospice care at home via an [facility], diabetes mellitus type 2, obstructive sleep apnea, hypertension, and chronic respiratory failure on oxygen who was  admitted on 10/09/2022 for management of shortness of breath.  During admission patient found to have evidence of COPD exacerbation.  Palliative medicine team consulted to assist with complex medical decision making. Of note patient seen by palliative medicine provider, Aurelio Jew, NP, at [facility] during hospitalization less than a month ago in July.  # Complex medical decision making/goals of care  -Attempted to engage in goals of care conversation with patient as detailed above in HPI.  Patient noted to be very lethargic during conversation and so could not actively engage in extensive discussion.  Patient was able to confirm that she was at home with [facility] hospice support.  -Patient does have ACP documentation on file naming daughter is HCPOA.  In patient's living will section of documentation, patient initialed that if she is unable to communicate her healthcare decisions, she would desire that her life not be prolonged by life prolonging measures in the following situations: Has a condition that cannot be cured and that will result in her death within a relatively short period of time; becomes unconscious and my doctors determined to a high degree  of medical certainty she will never regain consciousness; she suffers from advanced dementia or any other condition which resulted in substantial loss of ability to think and doctors determined to a high degree of medical certainty this is not going to get better.  Patient did elect that healthcare agent has the authority to make decisions that are different from what is indicated in her living well.  -  Code Status: Full Code    -Attempted to address CODE STATUS though patient not able to maintain engagement during conversation.  Prior palliative medicine team provider in July had discussed consideration of DNR with patient.  # Symptom management  -As per hospitalist  # Psycho-social/Spiritual Support:  - Support System: Daughter  # Discharge  Planning:  Home with Hospice (already receiving via [facility] as per patient report)  Thank you for allowing the palliative care team to participate in the care [facility].  Alvester Morin, DO Palliative Care Provider PMT # [phone]  If patient remains symptomatic despite maximum doses, please call PMT at [phone] between 0700 and 1900. Outside of these hours, please call attending, as PMT does not have night coverage.  This provider spent a total of 62 minutes providing patient's care.  Includes review of EMR, discussing care with other staff members involved in patient's medical care, obtaining relevant history and information from patient and/or patient's family, and personal review of imaging and lab work. Greater than 50% of the time was spent counseling and coordinating care related to the above assessment and plan.    *Please note that this is a verbal dictation therefore any spelling or grammatical errors are due to the "Dragon Medical One" system interpretation.

## 2022-10-10 NOTE — Progress Notes (Signed)
Initial Nutrition Assessment  INTERVENTION:   -Ensure MAX Protein po BID, each supplement provides 150 kcal and 30 grams of protein   -Multivitamin with minerals daily -500 mg Vitamin C BID -220 mg Zinc sulfate daily x 14 days  NUTRITION DIAGNOSIS:   Increased nutrient needs related to wound healing as evidenced by estimated needs.  GOAL:   Patient will meet greater than or equal to 90% of their needs  MONITOR:   PO intake, Supplement acceptance, Labs, Weight trends, I & O's, Skin  REASON FOR ASSESSMENT:   Consult Assessment of nutrition requirement/status  ASSESSMENT:   62 y.o. female with a history of end-stage COPD currently receiving hospice care, chronic respiratory failure on oxygen.  Patient presented secondary to shortness of breath and was found to have evidence of a COPD exacerbation.  Patient in room, family at bedside. Pt lethargic but able to answer my questions. Pt reports eating 1 meal a day and eating a lot of "junk" throughout the day. When asked what she eats she states cookies, cakes, chips, ice cream and candy. Per family she gets mad if she can't have these things. Recent HgbA1c is 6.8 which is down from previous. Suspect this may be due to inadequate PO despite snacking on high carbohydrate foods. Will order protein shake and vitamins to aid in wound healing. Encouraged consistent balanced meals. Pt states she sometimes has trouble swallowing d/t dry mouth. Pt did not eat any breakfast this morning but states a meal is on its way.  Family complains of hospice not providing adequate home care. Encouraged them to bring this up with MD and TOC.  Per weight records, pt with minimal weight loss.  Medications: Lactated ringers  Labs reviewed: CBGs: 94 Low Na   NUTRITION - FOCUSED PHYSICAL EXAM:  No depletions. Noted missing teeth.  Diet Order:   Diet Order             Diet Carb Modified Fluid consistency: Thin; Room service appropriate? Yes  Diet  effective now                   EDUCATION NEEDS:   Education needs have been addressed  Skin:  Skin Assessment: Reviewed RN Assessment Chronic right foot ulcer.  Last BM:  PTA  Height:   Ht Readings from Last 1 Encounters:  10/09/22 5\' 8"  (1.727 m)    Weight:   Wt Readings from Last 1 Encounters:  10/09/22 88.5 kg    BMI:  Body mass index is 29.65 kg/m.  Estimated Nutritional Needs:   Kcal:  1650-1850  Protein:  85-100g  Fluid:  1.8L/day   Tilda Franco, MS, RD, LDN Inpatient Clinical Dietitian Contact information available via Amion

## 2022-10-10 NOTE — Evaluation (Signed)
Physical Therapy Evaluation Patient Details Name: Joan [name] MRN: [id number] DOB: [birth year] Today's Date: 10/10/2022  History of Present Illness  62 y.o. female with medical history significant of end stage COPD.  Pt on 3L home O2 at baseline, is active with hospice though remains a full code. Complains of increased SOB.  Clinical Impression  Pt admitted as above and presenting with functional mobility limitations 2* generalized weakness, decreased endurance and ambulatory balance deficits with hx of falls.  Pt should progress to dc home to prior living arrangement followed by hospice and with aide 3x week.      If plan is discharge home, recommend the following: A little help with walking and/or transfers;A little help with bathing/dressing/bathroom;Assist for transportation;Help with stairs or ramp for entrance;Assistance with cooking/housework   Can travel by private vehicle        Equipment Recommendations None recommended by PT  Recommendations for Other Services       Functional Status Assessment Patient has had a recent decline in their functional status and demonstrates the ability to make significant improvements in function in a reasonable and predictable amount of time.     Precautions / Restrictions Precautions Precautions: Other (comment) Precaution Comments: COPD, left foot wound Restrictions Weight Bearing Restrictions: No      Mobility  Bed Mobility Overal bed mobility: Modified Independent             General bed mobility comments: supine<>sit unassisted    Transfers Overall transfer level: Needs assistance Equipment used: None Transfers: Sit to/from Stand Sit to Stand: Supervision           General transfer comment: Provided SBA for safety although no physical required or LOB noted.    Ambulation/Gait Ambulation/Gait assistance: Contact guard assist, Supervision Gait Distance (Feet): 105 Feet (and 15' into bathroom) Assistive  device: Rolling walker (2 wheels) Gait Pattern/deviations: Step-to pattern, Step-through pattern, Decreased step length - right, Decreased step length - left, Shuffle, Trunk flexed       General Gait Details: cues for posture, position from RW and safety awareness  Stairs            Wheelchair Mobility     Tilt Bed    Modified Rankin (Stroke Patients Only)       Balance Overall balance assessment: Mild deficits observed, not formally tested                                           Pertinent Vitals/Pain Pain Assessment Pain Assessment: No/denies pain    Home Living Family/patient expects to be discharged to:: Hospice/Palliative care                 Home Equipment: Rollator (4 wheels);Cane - single point;Shower seat;Wheelchair - manual;BSC/3in1;Electric scooter Additional Comments: Home O2 at baseline. Adjustable bed, manual WC, shower chair. ramped entrance 1 level house    Prior Function Prior Level of Function : Needs assist             Mobility Comments: Pt uses manual WC in community. No device for mobility in the home but states she has a rollator she is planning to use bc she has fallen ADLs Comments: Aide assists 3X/wk with bathing/dressing. Pt requires assist for LB ADLs. Has modified tub/shower with shower seat.     Extremity/Trunk Assessment   Upper Extremity Assessment Upper Extremity Assessment: Overall WFL for  tasks assessed    Lower Extremity Assessment Lower Extremity Assessment: Generalized weakness    Cervical / Trunk Assessment Cervical / Trunk Assessment: Normal  Communication   Communication Communication: No apparent difficulties  Cognition Arousal: Alert Behavior During Therapy: WFL for tasks assessed/performed, Impulsive Overall Cognitive Status: Within Functional Limits for tasks assessed                                          General Comments General comments (skin integrity,  edema, etc.): O2 on during session at 2L    Exercises     Assessment/Plan    PT Assessment Patient needs continued PT services  PT Problem List Decreased strength;Decreased activity tolerance;Decreased balance;Decreased mobility;Decreased knowledge of use of DME;Decreased safety awareness       PT Treatment Interventions DME instruction;Gait training;Functional mobility training;Therapeutic activities;Therapeutic exercise;Balance training;Patient/family education    PT Goals (Current goals can be found in the Care Plan section)  Acute Rehab PT Goals Patient Stated Goal: HOME PT Goal Formulation: With patient Time For Goal Achievement: 10/24/22 Potential to Achieve Goals: Good    Frequency Min 1X/week     Co-evaluation               AM-PAC PT "6 Clicks" Mobility  Outcome Measure Help needed turning from your back to your side while in a flat bed without using bedrails?: None Help needed moving from lying on your back to sitting on the side of a flat bed without using bedrails?: None Help needed moving to and from a bed to a chair (including a wheelchair)?: A Little Help needed standing up from a chair using your arms (e.g., wheelchair or bedside chair)?: A Little Help needed to walk in hospital room?: A Little Help needed climbing 3-5 steps with a railing? : A Lot 6 Click Score: 19    End of Session Equipment Utilized During Treatment: Gait belt;Oxygen Activity Tolerance: Patient tolerated treatment well Patient left: Other (comment) (sitting EOB) Nurse Communication: Mobility status PT Visit Diagnosis: Muscle weakness (generalized) (M62.81);Difficulty in walking, not elsewhere classified (R26.2);History of falling (Z91.81)    Time: 1610-9604 PT Time Calculation (min) (ACUTE ONLY): 31 min   Charges:   PT Evaluation $PT Eval Low Complexity: 1 Low PT Treatments $Gait Training: 8-22 mins PT General Charges $$ ACUTE PT VISIT: 1 Visit         Mauro Kaufmann PT Acute Rehabilitation Services Pager [phone] Office [phone]   [facility] 10/10/2022, 2:39 PM

## 2022-10-11 DIAGNOSIS — Z7189 Other specified counseling: Secondary | ICD-10-CM | POA: Diagnosis not present

## 2022-10-11 DIAGNOSIS — J441 Chronic obstructive pulmonary disease with (acute) exacerbation: Secondary | ICD-10-CM | POA: Diagnosis not present

## 2022-10-11 DIAGNOSIS — Z515 Encounter for palliative care: Secondary | ICD-10-CM | POA: Diagnosis not present

## 2022-10-11 DIAGNOSIS — J9611 Chronic respiratory failure with hypoxia: Secondary | ICD-10-CM | POA: Diagnosis not present

## 2022-10-11 LAB — BASIC METABOLIC PANEL
Anion gap: 10 (ref 5–15)
BUN: 15 mg/dL (ref 6–20)
CO2: 28 mmol/L (ref 22–32)
Calcium: 9.1 mg/dL (ref 8.9–10.3)
Chloride: 98 mmol/L (ref 98–111)
Creatinine, Ser: 0.6 mg/dL (ref 0.44–1.00)
GFR, Estimated: >60 mL/min (ref >60)
Glucose, Bld: 145 mg/dL — ABNORMAL HIGH (ref 70–99)
Potassium: 4.5 mmol/L (ref 3.5–5.1)
Sodium: 136 mmol/L (ref 135–145)

## 2022-10-11 LAB — GLUCOSE, CAPILLARY
Glucose-Capillary: 181 mg/dL — ABNORMAL HIGH (ref 70–99)
Glucose-Capillary: 136 mg/dL — ABNORMAL HIGH (ref 70–99)
Glucose-Capillary: 110 mg/dL — ABNORMAL HIGH (ref 70–99)

## 2022-10-11 MED ORDER — PREDNISONE 20 MG PO TABS: 40.0000 mg | ORAL_TABLET | Freq: Every day | ORAL | 0 refills | Status: DC

## 2022-10-11 MED ORDER — ENSURE MAX PROTEIN PO LIQD: 11.0000 [oz_av] | Freq: Two times a day (BID) | ORAL | 0 refills | Status: AC

## 2022-10-11 MED ORDER — CEFDINIR 300 MG PO CAPS: 300.0000 mg | ORAL_CAPSULE | Freq: Two times a day (BID) | ORAL | 0 refills | Status: AC

## 2022-10-11 MED ORDER — ZINC SULFATE 220 (50 ZN) MG PO CAPS: 220.0000 mg | ORAL_CAPSULE | Freq: Every day | ORAL | 0 refills | Status: AC

## 2022-10-11 MED ORDER — ASCORBIC ACID 500 MG PO TABS: 500.0000 mg | ORAL_TABLET | Freq: Two times a day (BID) | ORAL | 0 refills | Status: AC

## 2022-10-11 MED ORDER — AMLODIPINE BESYLATE 5 MG PO TABS: 5.0000 mg | ORAL_TABLET | Freq: Every day | ORAL | 2 refills | Status: DC

## 2022-10-11 MED ORDER — PHENOL 1.4 % MT LIQD: 1.0000 | OROMUCOSAL | Status: DC | PRN

## 2022-10-11 NOTE — Progress Notes (Signed)
PROGRESS NOTE    Joan [name]  WGN:[id number] DOB: 1961-01-18 DOA: 10/09/2022 PCP: Myrlene Broker, MD   Brief Narrative: Joan [name] is a 62 y.o. female with a history of end-stage COPD currently receiving hospice care, chronic respiratory failure on oxygen.  Patient presented secondary to shortness of breath and was found to have evidence of a COPD exacerbation.  Patient started on steroids, empiric antibiotics, breathing treatments.   Assessment and Plan:  COPD with acute exacerbation End-stage COPD Unclear etiology for acute illness, but patient continues to smoke which may be contributory.  COVID-19 testing negative.  Patient on home oxygen.  Patient started on ceftriaxone, Solu-Medrol, scheduled albuterol, in addition to continuing steroid inhaler, anticholinergic inhaler, Singulair. -Continue ceftriaxone, Solu-Medrol, albuterol, Incruse Ellipta, Dulera, Singulair  Chronic respiratory failure with hypoxia Patient uses 3 L/min of oxygen as an outpatient.  Currently stable.  Right foot ulcer Chronic.  No evidence of acute infection.  Hypokalemia Potassium of 2.6 on admission.  Resolved with potassium supplementation.  Hyponatremia Sodium of 127 on admission.  Possibly related to the use of hydrochlorothiazide.  Has improved slightly to 129 with IV fluids.  Asymptomatic. -Repeat BMP -Discontinue hydrochlorothiazide on discharge  Diabetes mellitus type 2 Controlled with hemoglobin A1c of 6.8%.  Patient is not on medication management as an outpatient.  Patient started on SSI and carb modified diet for inpatient.  Obstructive sleep apnea Patient uses CPAP as an outpatient. -Continue CPAP nightly  Primary hypertension Patient is on atenolol and hydrochlorothiazide as an outpatient.  Hydrochlorothiazide held secondary to hyponatremia. -Continue atenolol    DVT prophylaxis: Lovenox Code Status:   Code Status: Full Code Family Communication: None at  bedside Disposition Plan: Discharge home likely in 24 hours   Consultants:  None  Procedures:  None  Antimicrobials: Ceftriaxone IV   Subjective: Breathing better  Objective: BP (!) 149/84 (BP Location: Right Arm)   Pulse 77   Temp 98.5 F (36.9 C)   Resp 17   Ht 5\' 8"  (1.727 m)   Wt 88.5 kg   SpO2 99%   BMI 29.65 kg/m   Examination:  General exam: Appears calm and comfortable Respiratory system: Clear to auscultation. Respiratory effort normal. Cardiovascular system: S1 & S2 heard, RRR. Gastrointestinal system: Abdomen is nondistended, soft and nontender.  Normal bowel sounds heard. Central nervous system: Alert and oriented. No focal neurological deficits. Psychiatry: Judgement and insight appear normal. Mood & affect appropriate.    Data Reviewed: I have personally reviewed following labs and imaging studies  CBC Lab Results  Component Value Date   WBC 8.8 10/09/2022   RBC 3.78 (L) 10/09/2022   HGB 11.0 (L) 10/09/2022   HCT 32.6 (L) 10/09/2022   MCV 86.2 10/09/2022   MCH 29.1 10/09/2022   PLT 298 10/09/2022   MCHC 33.7 10/09/2022   RDW 15.4 10/09/2022   LYMPHSABS 1.7 10/09/2022   MONOABS 0.6 10/09/2022   EOSABS 0.2 10/09/2022   BASOSABS 0.1 10/09/2022     Last metabolic panel Lab Results  Component Value Date   NA 129 (L) 10/10/2022   K 3.7 10/10/2022   CL 87 (L) 10/10/2022   CO2 32 10/10/2022   BUN 6 10/10/2022   CREATININE 0.72 10/10/2022   GLUCOSE 84 10/10/2022   GFRNONAA >60 10/10/2022   GFRAA 59 (L) 10/28/2019   CALCIUM 9.0 10/10/2022   PHOS 4.5 09/11/2022   PROT 5.8 (L) 09/11/2022   ALBUMIN 3.2 (L) 09/11/2022   BILITOT 0.4 09/11/2022  ALKPHOS 99 09/11/2022   AST 18 09/11/2022   ALT 21 09/11/2022   ANIONGAP 10 10/10/2022    GFR: Estimated Creatinine Clearance: 87 mL/min (by C-G formula based on SCr of 0.72 mg/dL).  Recent Results (from the past 240 hour(s))  SARS Coronavirus 2 by RT PCR (hospital order, performed in [facility] hospital lab) *cepheid single result test* Anterior Nasal Swab     Status: None   Collection Time: 10/09/22  7:10 PM   Specimen: Anterior Nasal Swab  Result Value Ref Range Status   SARS Coronavirus 2 by RT PCR NEGATIVE NEGATIVE Final    Comment: (NOTE) SARS-CoV-2 target nucleic acids are NOT DETECTED.  The SARS-CoV-2 RNA is generally detectable in upper and lower respiratory specimens during the acute phase of infection. The lowest concentration of SARS-CoV-2 viral copies this assay can detect is 250 copies / mL. A negative result does not preclude SARS-CoV-2 infection and should not be used as the sole basis for treatment or other patient management decisions.  A negative result may occur with improper specimen collection / handling, submission of specimen other than nasopharyngeal swab, presence of viral mutation(s) within the areas targeted by this assay, and inadequate number of viral copies (<250 copies / mL). A negative result must be combined with clinical observations, patient history, and epidemiological information.  Fact Sheet for Patients:   RoadLapTop.co.za  Fact Sheet for Healthcare Providers: http://kim-miller.com/  This test is not yet approved or  cleared by the [place] FDA and has been authorized for detection and/or diagnosis of SARS-CoV-2 by FDA under an Emergency Use Authorization (EUA).  This EUA will remain in effect (meaning this test can be used) for the duration of the COVID-19 declaration under Section 564(b)(1) of the Act, 21 U.S.C. section 360bbb-3(b)(1), unless the authorization is terminated or revoked sooner.  Performed at [facility], [address], [place], [place] [address]   Culture, blood (Routine X 2) w Reflex to ID Panel     Status: None (Preliminary result)   Collection Time: 10/09/22  7:29 PM   Specimen: BLOOD  Result Value Ref Range Status   Specimen  Description   Final    BLOOD SITE NOT SPECIFIED Performed at [facility], [address] [place], [place] [address]    Special Requests   Final    BOTTLES DRAWN AEROBIC ONLY Blood Culture adequate volume Performed at [facility] [address], [place], [place] [address]    Culture   Final    NO GROWTH 2 DAYS Performed at [facility], [address] [place], [place] [address]    Report Status PENDING  Incomplete  Culture, blood (Routine X 2) w Reflex to ID Panel     Status: None (Preliminary result)   Collection Time: 10/10/22 12:24 AM   Specimen: BLOOD  Result Value Ref Range Status   Specimen Description   Final    BLOOD BLOOD LEFT ARM Performed at [facility], [address] [place], [place] [address]    Special Requests   Final    BOTTLES DRAWN AEROBIC AND ANAEROBIC Blood Culture results may not be optimal due to an inadequate volume of blood received in culture bottles Performed at [facility], [address] [place], [place] [address]    Culture   Final    NO GROWTH 1 DAY Performed at [facility], [address] [place], [place] [address]  Report Status PENDING  Incomplete      Radiology Studies: DG Chest Port 1 View  Result Date: 10/09/2022 CLINICAL DATA:  SOB (shortness of breath) EXAM: PORTABLE CHEST 1 VIEW COMPARISON:  CXR 09/14/22 FINDINGS: Small left pleural effusion. No pneumothorax. Normal cardiac and mediastinal contours. Hazy opacity at the left lung base favored to represent atelectasis. No radiographically apparent displaced rib fractures. Visualized upper abdomen is unremarkable. IMPRESSION: Likely trace left pleural effusion with superimposed airspace opacity that could represent atelectasis or infection. Electronically Signed   By: Lorenza Cambridge M.D.   On: 10/09/2022 18:50   DG Foot Complete Right  Result Date: 10/09/2022 Please see detailed radiograph  report in office note.     LOS: 1 day    Jacquelin Hawking, MD [facility] 10/11/2022, 2:43 PM   If 7PM-7AM, please contact night-coverage [email or web address]

## 2022-10-11 NOTE — Progress Notes (Signed)
Daily Progress Note   Patient Name: Joan [name]       Date: 10/11/2022 DOB: 1960/12/26  Age: 62 y.o. MRN#: [id number] Attending Physician: Narda Bonds, MD Primary Care Physician: Myrlene Broker, MD Admit Date: 10/09/2022 Length of Stay: 1 day  Reason for Consultation/Follow-up: Establishing goals of care  Subjective:   CC: Patient feeling better today. Following up regarding complex medical decision making.   Subjective:  Reviewed EMR prior to presenting to bedside.  Discussed care with hospitalist as well for medical updates.  Plan is for patient to discharge today.  Presented to bedside to meet with patient.  Patient much more awake and interactive today.  Patient sitting up on the side of the bed eating her breakfast.  Patient able to engage in conversation more easily today with her shortness of breath.  Patient notes feeling better with receiving IV Solu-Medrol.  Plan is for patient to return home with [facility] hospice support to continue. All questions answered at that time.  Noted palliative medicine team will continue to follow with patient's medical journey.  Review of Systems  Objective:   Vital Signs:  BP (!) 175/92 (BP Location: Right Arm)   Pulse 84   Temp 97.7 F (36.5 C) (Oral)   Resp 19   Ht 5\' 8"  (1.727 m)   Wt 88.5 kg   SpO2 99%   BMI 29.65 kg/m   Physical Exam: General: NAD, alert, sitting on side of bed Eyes: No drainage noted HENT: moist mucous membranes Cardiovascular: RRR Respiratory: no increased work of breathing noted, not in respiratory distress Neuro: A&Ox4, following commands easily Psych: appropriately answers all questions  Imaging:  I personally reviewed recent imaging.   Assessment & Plan:   Assessment: Patient is a 62 year old female with a past medical history of end-stage COPD currently receiving hospice care at home via an [facility], diabetes mellitus type 2, obstructive sleep apnea, hypertension, and chronic  respiratory failure on oxygen who was admitted on 10/09/2022 for management of shortness of breath.  During admission patient found to have evidence of COPD exacerbation.  Palliative medicine team consulted to assist with complex medical decision making. Of note patient seen by palliative medicine provider, Aurelio Jew, NP, at [facility] during hospitalization less than a month ago in July.  Recommendations/Plan: # Complex medical decision making/goals of care:  -Patient notes breathing improved with IV solumedrol. Hopeful to be discharged today.Patient confirmed plan to return home with [facility] hospice support.                -Already reviewed ACP documentation on file naming daughter is HCPOA.  In patient's living will section of documentation, patient initialed that if she is unable to communicate her healthcare decisions, she would desire that her life not be prolonged by life prolonging measures in the following situations: Has a condition that cannot be cured and that will result in her death within a relatively short period of time; becomes unconscious and my doctors determined to a high degree of medical certainty she will never regain consciousness; she suffers from advanced dementia or any other condition which resulted in substantial loss of ability to think and doctors determined to a high degree of medical certainty this is not going to get better.  Patient did elect that healthcare agent has the authority to make decisions that are different from what is indicated in her living well.                -  Code Status: Full Code                                -Would recommend continued engagement of conversation with home hospice group [facility] since patient has well established relationship with them and so more likely to engage in this conversation.    # Symptom management                -As per hospitalist   # Psycho-social/Spiritual Support:  - Support System: Daughter   # Discharge Planning:   Home with Hospice (already receiving via [facility] as per patient report)  Discussed with: patient, hospitalist  Thank you for allowing the palliative care team to participate in the care [facility].  Alvester Morin, DO Palliative Care Provider PMT # [phone]  If patient remains symptomatic despite maximum doses, please call PMT at [phone] between 0700 and 1900. Outside of these hours, please call attending, as PMT does not have night coverage.  *Please note that this is a verbal dictation therefore any spelling or grammatical errors are due to the "[employer]agon [employer] One" system interpretation.

## 2022-10-11 NOTE — Progress Notes (Signed)
Mobility Specialist - Progress Note   10/11/22 1250  Mobility  Activity Ambulated with assistance in hallway  Level of Assistance Standby assist, set-up cues, supervision of patient - no hands on  Assistive Device Front wheel walker  Distance Ambulated (ft) 100 ft  Range of Motion/Exercises Active  Activity Response Tolerated well  Mobility Referral Yes  $Mobility charge 1 Mobility  Mobility Specialist Start Time (ACUTE ONLY) 1240  Mobility Specialist Stop Time (ACUTE ONLY) 1250  Mobility Specialist Time Calculation (min) (ACUTE ONLY) 10 min   Pt received in bed and agreed to mobility. On 3L. Had no issues throughout session, returned to restroom with all needs met.  Marilynne Halsted Mobility Specialist

## 2022-10-11 NOTE — Plan of Care (Signed)
  Problem: Fluid Volume: Goal: Ability to maintain a balanced intake and output will improve Outcome: Progressing   Problem: Metabolic: Goal: Ability to maintain appropriate glucose levels will improve Outcome: Progressing   Problem: Nutritional: Goal: Maintenance of adequate nutrition will improve Outcome: Progressing   Problem: Skin Integrity: Goal: Risk for impaired skin integrity will decrease Outcome: Progressing   Problem: Clinical Measurements: Goal: Ability to maintain clinical measurements within normal limits will improve Outcome: Progressing   Problem: Pain Managment: Goal: General experience of comfort will improve Outcome: Progressing   Problem: Safety: Goal: Ability to remain free from injury will improve Outcome: Progressing

## 2022-10-11 NOTE — Discharge Instructions (Signed)
Joan [name],  You were in the hospital because of a COPD exacerbation. This has improved with steroids, breathing treatments and antibiotics. Please continue your medications on discharge. You also had low sodium which may have been related to your hydrochlorothiazide. Please stop this medication. I have prescribed a new blood pressure medication for you. Please follow-up with your PCP.

## 2022-10-12 DIAGNOSIS — J441 Chronic obstructive pulmonary disease with (acute) exacerbation: Secondary | ICD-10-CM | POA: Diagnosis not present

## 2022-10-12 LAB — GLUCOSE, CAPILLARY: Glucose-Capillary: 111 mg/dL — ABNORMAL HIGH (ref 70–99)

## 2022-10-12 MED ORDER — METHYLPREDNISOLONE 32 MG PO TABS: 32.0000 mg | ORAL_TABLET | Freq: Every day | ORAL | 0 refills | Status: AC

## 2022-10-12 NOTE — Discharge Summary (Signed)
Physician Discharge Summary   Patient: Joan [name] MRN: [id number] DOB: 10-02-1960  Admit date:     10/09/2022  Discharge date: 10/12/22  Discharge Physician: Jacquelin Hawking, MS   PCP: Myrlene Broker, MD   Recommendations at discharge:  Hospital follow-up with PCP  Discharge Diagnoses: Principal Problem:   COPD with acute exacerbation [facility]) Active Problems:   Chronic respiratory failure with hypoxia (HCC)   Essential hypertension   OSA on CPAP   Controlled type 2 diabetes mellitus with neuropathy (HCC)   Hypokalemia   Ulcer of right foot (HCC)   Counseling and coordination of care   Goals of care, counseling/discussion   Palliative care encounter   COPD exacerbation (HCC)  Resolved Problems:   COPD exacerbation [facility])  Hospital Course: Joan [name] is a 62 y.o. female with a history of end-stage COPD currently receiving hospice care, chronic respiratory failure on oxygen.  Patient presented secondary to shortness of breath and was found to have evidence of a COPD exacerbation.  Patient started on steroids, empiric antibiotics, breathing treatments with improvement of symptoms.  Assessment and Plan:  COPD with acute exacerbation End-stage COPD Unclear etiology for acute illness, but patient continues to smoke which may be contributory.  COVID-19 testing negative.  Patient on home oxygen.  Patient started on ceftriaxone, Solu-Medrol, scheduled albuterol, in addition to continuing steroid inhaler, anticholinergic inhaler, Singulair. Continue home inhalers on discharge. Discharge on Cefdinir and Medrol.   Chronic respiratory failure with hypoxia Patient uses 3 L/min of oxygen as an outpatient.  Currently stable.   Right foot ulcer Chronic.  No evidence of acute infection.   Hypokalemia Potassium of 2.6 on admission.  Resolved with potassium supplementation.   Hyponatremia Sodium of 127 on admission.  Possibly related to the use of hydrochlorothiazide.   Has improved slightly to 136 with IV fluids.  Asymptomatic. Hydrochlorothiazide discontinued on discharge.   Diabetes mellitus type 2 Controlled with hemoglobin A1c of 6.8%.  Patient is not on medication management as an outpatient.  Patient started on SSI and carb modified diet for inpatient.   Obstructive sleep apnea Patient uses CPAP as an outpatient.   Primary hypertension Patient is on atenolol and hydrochlorothiazide as an outpatient.  Hydrochlorothiazide held secondary to hyponatremia. Continue home atenolol.   Consultants: None Procedures performed: None  Disposition: Home Diet recommendation: Regular diet   DISCHARGE MEDICATION: Allergies as of 10/12/2022       Reactions   Keflex [cephalexin] Rash, Other (See Comments)   Has tolerated amoxicillin since had keflex   Shellfish Allergy Shortness Of Breath, Nausea And Vomiting, Other (See Comments)   Stomach cramps   Tetracyclines & Related Anaphylaxis, Swelling, Other (See Comments)   Throat swelling requiring hospitalization   Dilaudid [hydromorphone Hcl] Other (See Comments)   "Lethargy"   Prednisone Other (See Comments)   "counter reacts", has tolerated IV medrol   Clindamycin/lincomycin Other (See Comments)   Burning sensation in her mouth down her throat    Incruse Ellipta [umeclidinium Bromide] Nausea Only   Molds & Smuts Itching   Tuberculin Tests Other (See Comments)   False postive   Umeclidinium    Other Reaction(s): GI Intolerance        Medication List     STOP taking these medications    hydrochlorothiazide 25 MG tablet Commonly known as: HYDRODIURIL       TAKE these medications    Accu-Chek Softclix Lancets lancets Use as instructed   amLODipine 5 MG tablet Commonly known  as: NORVASC Take 1 tablet (5 mg total) by mouth daily.   ascorbic acid 500 MG tablet Commonly known as: VITAMIN C Take 1 tablet (500 mg total) by mouth 2 (two) times daily.   atenolol 25 MG tablet Commonly known  as: TENORMIN TAKE 1 TABLET BY MOUTH DAILY   atorvastatin 20 MG tablet Commonly known as: LIPITOR TAKE 1 TABLET BY MOUTH ONCE  DAILY   Blood Glucose Monitoring Suppl Devi 1 each by Does not apply route in the morning, at noon, and at bedtime. May substitute to any manufacturer covered by patient's insurance.   buPROPion 300 MG 24 hr tablet Commonly known as: WELLBUTRIN XL TAKE 1 TABLET BY MOUTH DAILY   cefdinir 300 MG capsule Commonly known as: OMNICEF Take 1 capsule (300 mg total) by mouth 2 (two) times daily for 2 days.   Combivent Respimat 20-100 MCG/ACT Aers respimat Generic drug: Ipratropium-Albuterol Inhale 1 puff into the lungs every 6 (six) hours as needed for wheezing or shortness of breath.   cyclobenzaprine 10 MG tablet Commonly known as: FLEXERIL TAKE 1 TABLET BY MOUTH AT  BEDTIME   diazepam 5 MG tablet Commonly known as: VALIUM Take 5-10 mg by mouth every 6 (six) hours as needed.   diphenhydrAMINE 25 mg capsule Commonly known as: BENADRYL Take 25 mg by mouth 2 (two) times daily.   Ensure Max Protein Liqd Take 330 mLs (11 oz total) by mouth 2 (two) times daily.   fluticasone 50 MCG/ACT nasal spray Commonly known as: FLONASE Place 2 sprays into both nostrils daily. What changed:  when to take this reasons to take this   guaiFENesin-Codeine 225-7.5 MG/5ML Liqd Take 5 mLs by mouth 3 (three) times daily as needed.   levothyroxine 25 MCG tablet Commonly known as: SYNTHROID TAKE 1 TABLET BY MOUTH DAILY  BEFORE BREAKFAST   MELATONIN PO Take 5 mg by mouth at bedtime as needed (sleep).   methylPREDNISolone 32 MG tablet Commonly known as: Medrol Take 1 tablet (32 mg total) by mouth daily for 2 days. Start taking on: October 13, 2022   montelukast 10 MG tablet Commonly known as: SINGULAIR TAKE 1 TABLET BY MOUTH AT  BEDTIME   naproxen 500 MG tablet Commonly known as: NAPROSYN Take 1 tablet (500 mg total) by mouth 2 (two) times daily with a meal. What  changed:  when to take this reasons to take this   nystatin-triamcinolone ointment Commonly known as: MYCOLOG Apply 1 application topically 2 (two) times daily as needed (applied to skin folds/groins/under breast skin irritation rash.).   OXYGEN Inhale 3 L/min into the lungs See admin instructions. Inhale  3 L/min of oxygen into the lungs w/CPAP at bedtime and as needed for shortness of breath with "strenuous activity" during the day   pantoprazole 40 MG tablet Commonly known as: PROTONIX TAKE 1 TABLET BY MOUTH DAILY What changed:  when to take this reasons to take this   Pataday 0.2 % Soln Generic drug: Olopatadine HCl Place 1 drop into both eyes daily.   polyethylene glycol 17 g packet Commonly known as: MIRALAX / GLYCOLAX Take 17 g by mouth daily as needed for mild constipation.   pregabalin 300 MG capsule Commonly known as: LYRICA TAKE 1 CAPSULE BY MOUTH TWICE  DAILY   QUEtiapine 100 MG tablet Commonly known as: SEROQUEL TAKE 1 TABLET BY MOUTH AT  BEDTIME   roflumilast 500 MCG Tabs tablet Commonly known as: DALIRESP Take 1 tablet (500 mcg total) by mouth daily.  silver sulfADIAZINE 1 % cream Commonly known as: Silvadene Apply 1 Application topically daily.   traZODone 100 MG tablet Commonly known as: DESYREL TAKE 1 TABLET BY MOUTH AT  BEDTIME   triamcinolone cream 0.1 % Commonly known as: KENALOG Apply 1 Application topically 2 (two) times daily. What changed:  when to take this reasons to take this   venlafaxine XR 150 MG 24 hr capsule Commonly known as: EFFEXOR-XR TAKE 1 CAPSULE BY MOUTH DAILY  WITH BREAKFAST   witch hazel-glycerin pad Commonly known as: TUCKS Apply topically as needed for itching. What changed:  how much to take when to take this   zinc sulfate 220 (50 Zn) MG capsule Take 1 capsule (220 mg total) by mouth daily.        Follow-up Information     Myrlene Broker, MD. Schedule an appointment as soon as possible for a  visit in 1 week(s).   Specialty: Internal Medicine Why: For hospital follow-up Contact information: [address] [place] [address] [phone]                Discharge Exam: BP (!) 166/89 (BP Location: Left Arm)   Pulse 71   Temp 98 F (36.7 C)   Resp 20   Ht 5\' 8"  (1.727 m)   Wt 88.5 kg   SpO2 (S) 94% Comment: doing ADL's  BMI 29.65 kg/m   General exam: Appears calm and comfortable Respiratory system: Mild wheezing. Respiratory effort normal. Cardiovascular system: S1 & S2 heard, RRR. Gastrointestinal system: Abdomen is nondistended, soft and nontender. Normal bowel sounds heard. Central nervous system: Alert and oriented. No focal neurological deficits. Musculoskeletal: No calf tenderness Skin: No cyanosis. No rashes Psychiatry: Judgement and insight appear normal. Mood & affect appropriate.   Condition at discharge: stable  The results of significant diagnostics from this hospitalization (including imaging, microbiology, ancillary and laboratory) are listed below for reference.   Imaging Studies: DG Chest Port 1 View  Result Date: 10/09/2022 CLINICAL DATA:  SOB (shortness of breath) EXAM: PORTABLE CHEST 1 VIEW COMPARISON:  CXR 09/14/22 FINDINGS: Small left pleural effusion. No pneumothorax. Normal cardiac and mediastinal contours. Hazy opacity at the left lung base favored to represent atelectasis. No radiographically apparent displaced rib fractures. Visualized upper abdomen is unremarkable. IMPRESSION: Likely trace left pleural effusion with superimposed airspace opacity that could represent atelectasis or infection. Electronically Signed   By: Lorenza Cambridge M.D.   On: 10/09/2022 18:50   DG Foot Complete Right  Result Date: 10/09/2022 Please see detailed radiograph report in office note.  CT HEAD WO CONTRAST ( )  Result Date: 09/14/2022 CLINICAL DATA:  Found on floor, head and neck trauma. EXAM: CT HEAD WITHOUT CONTRAST CT CERVICAL SPINE WITHOUT  CONTRAST TECHNIQUE: Multidetector CT imaging of the head and cervical spine was performed following the standard protocol without intravenous contrast. Multiplanar CT image reconstructions of the cervical spine were also generated. RADIATION DOSE REDUCTION: This exam was performed according to the departmental dose-optimization program which includes automated exposure control, adjustment of the mA and/or kV according to patient size and/or use of iterative reconstruction technique. COMPARISON:  07/02/2019. FINDINGS: CT HEAD FINDINGS Brain: No acute intracranial hemorrhage, midline shift or mass effect. No extra-axial fluid collection. Gray-white matter differentiation is within normal limits. No hydrocephalus. Vascular: No hyperdense vessel or unexpected calcification. Skull: Normal. Negative for fracture or focal lesion. Sinuses/Orbits: A few opacities are noted in the ethmoid air cells on the left. No acute orbital abnormality. Other: None.  CT CERVICAL SPINE FINDINGS Alignment: There is mild anterolisthesis at C4-C5 and C5-C6. Skull base and vertebrae: Examination is limited due to motion artifact. A lucency is noted in an osteophyte along the anterior longitudinal ligament anterior to the C5 vertebral body on the right, coronal image 23, suspicious for fracture. Anterior cervical spinal fusion hardware is present at C3-C4. Soft tissues and spinal canal: No visible canal hematoma. Possible mild prevertebral soft tissue stranding anterior to the C6 vertebral body on the right. Disc levels: Multilevel degenerative changes in the cervical spine and facet arthropathy. Upper chest: Emphysema is present at the lung apices. Other: None. IMPRESSION: 1. No acute intracranial process. 2. Lucency in an osteophyte along the anterior longitudinal ligament anterior to the C5 vertebral body on the right, suspicious for osteophyte fracture. 3. Multilevel degenerative changes in the cervical spine. Electronically Signed   By:  Thornell Sartorius M.D.   On: 09/14/2022 02:07   CT CERVICAL SPINE WO CONTRAST  Result Date: 09/14/2022 CLINICAL DATA:  Found on floor, head and neck trauma. EXAM: CT HEAD WITHOUT CONTRAST CT CERVICAL SPINE WITHOUT CONTRAST TECHNIQUE: Multidetector CT imaging of the head and cervical spine was performed following the standard protocol without intravenous contrast. Multiplanar CT image reconstructions of the cervical spine were also generated. RADIATION DOSE REDUCTION: This exam was performed according to the departmental dose-optimization program which includes automated exposure control, adjustment of the mA and/or kV according to patient size and/or use of iterative reconstruction technique. COMPARISON:  07/02/2019. FINDINGS: CT HEAD FINDINGS Brain: No acute intracranial hemorrhage, midline shift or mass effect. No extra-axial fluid collection. Gray-white matter differentiation is within normal limits. No hydrocephalus. Vascular: No hyperdense vessel or unexpected calcification. Skull: Normal. Negative for fracture or focal lesion. Sinuses/Orbits: A few opacities are noted in the ethmoid air cells on the left. No acute orbital abnormality. Other: None. CT CERVICAL SPINE FINDINGS Alignment: There is mild anterolisthesis at C4-C5 and C5-C6. Skull base and vertebrae: Examination is limited due to motion artifact. A lucency is noted in an osteophyte along the anterior longitudinal ligament anterior to the C5 vertebral body on the right, coronal image 23, suspicious for fracture. Anterior cervical spinal fusion hardware is present at C3-C4. Soft tissues and spinal canal: No visible canal hematoma. Possible mild prevertebral soft tissue stranding anterior to the C6 vertebral body on the right. Disc levels: Multilevel degenerative changes in the cervical spine and facet arthropathy. Upper chest: Emphysema is present at the lung apices. Other: None. IMPRESSION: 1. No acute intracranial process. 2. Lucency in an osteophyte  along the anterior longitudinal ligament anterior to the C5 vertebral body on the right, suspicious for osteophyte fracture. 3. Multilevel degenerative changes in the cervical spine. Electronically Signed   By: Thornell Sartorius M.D.   On: 09/14/2022 02:07   DG Chest 2 View  Result Date: 09/14/2022 CLINICAL DATA:  Shortness of breath EXAM: CHEST - 2 VIEW COMPARISON:  Chest radiograph dated 09/10/2022. FINDINGS: No focal consolidation, pleural effusion, or pneumothorax. The cardiac silhouette is within normal limits. No acute osseous pathology. IMPRESSION: No active cardiopulmonary disease. Electronically Signed   By: Elgie Collard M.D.   On: 09/14/2022 00:35    Microbiology: Results for orders placed or performed during the hospital encounter of 10/09/22  SARS Coronavirus 2 by RT PCR (hospital order, performed in [facility] hospital lab) *cepheid single result test* Anterior Nasal Swab     Status: None   Collection Time: 10/09/22  7:10 PM   Specimen: Anterior Nasal  Swab  Result Value Ref Range Status   SARS Coronavirus 2 by RT PCR NEGATIVE NEGATIVE Final    Comment: (NOTE) SARS-CoV-2 target nucleic acids are NOT DETECTED.  The SARS-CoV-2 RNA is generally detectable in upper and lower respiratory specimens during the acute phase of infection. The lowest concentration of SARS-CoV-2 viral copies this assay can detect is 250 copies / mL. A negative result does not preclude SARS-CoV-2 infection and should not be used as the sole basis for treatment or other patient management decisions.  A negative result may occur with improper specimen collection / handling, submission of specimen other than nasopharyngeal swab, presence of viral mutation(s) within the areas targeted by this assay, and inadequate number of viral copies (<250 copies / mL). A negative result must be combined with clinical observations, patient history, and epidemiological information.  Fact Sheet for Patients:    RoadLapTop.co.za  Fact Sheet for Healthcare Providers: http://kim-miller.com/  This test is not yet approved or  cleared by the [place] FDA and has been authorized for detection and/or diagnosis of SARS-CoV-2 by FDA under an Emergency Use Authorization (EUA).  This EUA will remain in effect (meaning this test can be used) for the duration of the COVID-19 declaration under Section 564(b)(1) of the Act, 21 U.S.C. section 360bbb-3(b)(1), unless the authorization is terminated or revoked sooner.  Performed at [facility], [address], [place], [place] [address]   Culture, blood (Routine X 2) w Reflex to ID Panel     Status: None (Preliminary result)   Collection Time: 10/09/22  7:29 PM   Specimen: BLOOD  Result Value Ref Range Status   Specimen Description   Final    BLOOD SITE NOT SPECIFIED Performed at [facility], [address] [place], [place] [address]    Special Requests   Final    BOTTLES DRAWN AEROBIC ONLY Blood Culture adequate volume Performed at [facility], [address], [place], [place] [address]    Culture   Final    NO GROWTH 3 DAYS Performed at [facility], [address], [place], [place] [address]    Report Status PENDING  Incomplete  Culture, blood (Routine X 2) w Reflex to ID Panel     Status: None (Preliminary result)   Collection Time: 10/10/22 12:24 AM   Specimen: BLOOD  Result Value Ref Range Status   Specimen Description   Final    BLOOD BLOOD LEFT ARM Performed at [facility], [address] [place], [place] [address]    Special Requests   Final    BOTTLES DRAWN AEROBIC AND ANAEROBIC Blood Culture results may not be optimal due to an inadequate volume of blood received in culture bottles Performed at [facility], [address], [place], [place] [address]    Culture   Final    NO  GROWTH 2 DAYS Performed at [facility], [address] [place], [place] [address]    Report Status PENDING  Incomplete   *Note: Due to a large number of results and/or encounters for the requested time period, some results have not been displayed. A complete set of results can be found in Results Review.    Labs: CBC: Recent Labs  Lab 10/09/22 1910  WBC 8.8  NEUTROABS 6.3  HGB 11.0*  HCT 32.6*  MCV 86.2  PLT 298   Basic Metabolic Panel: Recent Labs  Lab 10/09/22 1910 10/10/22 0024 10/11/22 1526  NA 127* 129* 136  K 2.6* 3.7 4.5  CL 83* 87* 98  CO2 34* 32 28  GLUCOSE 127* 84 145*  BUN 7 6 15   CREATININE 0.76 0.72 0.60  CALCIUM 9.0 9.0 9.1    CBG: Recent Labs  Lab 10/10/22 2135 10/11/22 0734 10/11/22 1648 10/11/22 2057 10/12/22 0730  GLUCAP 171* 181* 136* 110* 111*    Discharge time spent: 35 minutes.  Signed: Jacquelin Hawking, MD [facility] 10/12/2022

## 2022-10-12 NOTE — Progress Notes (Signed)
Physical Therapy Treatment Patient Details Name: Joan [name] MRN: [id number] DOB: 28-Jan-1961 Today's Date: 10/12/2022   History of Present Illness 62 y.o. female with medical history significant of end stage COPD.  Pt on 3L home O2 at baseline, is active with hospice though remains a full code. Complains of increased SOB.    PT Comments  Pt AxO x 3 very pleasant and willing.  Assisted with amb in hallway went well.  General Gait Details: tolerated a functional distance on 3 lts with avg sats 91% with HR 87 and only 1/4 dyspnea. Pt appears at prior level of mobility.  Pt stated she has been on oxygen about a year.  Pt plans to return home where she lives alone.  LPT has rec HH PT and no equipment.   If plan is discharge home, recommend the following: A little help with walking and/or transfers;A little help with bathing/dressing/bathroom;Assist for transportation;Help with stairs or ramp for entrance;Assistance with cooking/housework   Can travel by private vehicle        Equipment Recommendations  None recommended by PT    Recommendations for Other Services       Precautions / Restrictions Precautions Precautions:  (Home oxygen) Precaution Comments: COPD Restrictions Weight Bearing Restrictions: No     Mobility  Bed Mobility               General bed mobility comments: sitting EOB Indep on arrival    Transfers Overall transfer level: Needs assistance Equipment used: None Transfers: Sit to/from Stand Sit to Stand: Supervision           General transfer comment: good use of hands placement    Ambulation/Gait Ambulation/Gait assistance: Supervision Gait Distance (Feet): 155 Feet Assistive device: Rolling walker (2 wheels) Gait Pattern/deviations: Step-to pattern, Trunk flexed Gait velocity: decreased     General Gait Details: tolerated a functional distance on 3 lts with avg sats 91% with HR 87 and only 1/4 dyspnea.   Stairs              Wheelchair Mobility     Tilt Bed    Modified Rankin (Stroke Patients Only)       Balance                                            Cognition Arousal: Alert Behavior During Therapy: WFL for tasks assessed/performed, Impulsive Overall Cognitive Status: Within Functional Limits for tasks assessed                                 General Comments: AxO x 3 pleasant Lady and willing        Exercises      General Comments        Pertinent Vitals/Pain Pain Assessment Pain Assessment: No/denies pain    Home Living                          Prior Function            PT Goals (current goals can now be found in the care plan section) Progress towards PT goals: Progressing toward goals    Frequency    Min 1X/week      PT Plan      Co-evaluation  AM-PAC PT "6 Clicks" Mobility   Outcome Measure  Help needed turning from your back to your side while in a flat bed without using bedrails?: None Help needed moving from lying on your back to sitting on the side of a flat bed without using bedrails?: None Help needed moving to and from a bed to a chair (including a wheelchair)?: None Help needed standing up from a chair using your arms (e.g., wheelchair or bedside chair)?: None Help needed to walk in hospital room?: A Little Help needed climbing 3-5 steps with a railing? : A Little 6 Click Score: 22    End of Session Equipment Utilized During Treatment: Gait belt;Oxygen Activity Tolerance: Patient tolerated treatment well Patient left: in bed Nurse Communication: Mobility status PT Visit Diagnosis: Muscle weakness (generalized) (M62.81);Difficulty in walking, not elsewhere classified (R26.2);History of falling (Z91.81)     Time: 9323-5573 PT Time Calculation (min) (ACUTE ONLY): 23 min  Charges:    $Gait Training: 8-22 mins $Therapeutic Activity: 8-22 mins PT General Charges $$ ACUTE PT  VISIT: 1 Visit                     Felecia Shelling  PTA Acute  Rehabilitation Services Office M-F          [phone]

## 2022-10-12 NOTE — Progress Notes (Signed)
   10/12/22 0030  BiPAP/CPAP/SIPAP  Reason BIPAP/CPAP not in use Other(comment) (PT states she would self place, if she decided she wanted it, but is resting comfortbly without it.)

## 2022-10-14 LAB — CULTURE, BLOOD (ROUTINE X 2)
Culture: NO GROWTH
Special Requests: ADEQUATE

## 2022-10-15 LAB — CULTURE, BLOOD (ROUTINE X 2): Culture: NO GROWTH

## 2022-10-17 ENCOUNTER — Telehealth: Payer: Self-pay | Admitting: Internal Medicine

## 2022-10-17 NOTE — Telephone Encounter (Signed)
Received and placed inside office box

## 2022-10-17 NOTE — Telephone Encounter (Signed)
Patient called and said that [employer] faxed something to [place] today regarding her mobile wheel chair.  Please check the faxes so this can be processed as soon as possible.  Patient would like someone to call and let her know that it has been received:  [phone].

## 2022-10-18 NOTE — Telephone Encounter (Signed)
I have filled out, please fax most recent office visit with PCP. If this is not sufficient documentation patient may require visit. We will try.

## 2022-10-18 NOTE — Telephone Encounter (Signed)
Pt has already been informed that this was received and fax over

## 2022-10-23 ENCOUNTER — Other Ambulatory Visit: Payer: Self-pay | Admitting: Internal Medicine

## 2022-10-23 DIAGNOSIS — Z1231 Encounter for screening mammogram for malignant neoplasm of breast: Secondary | ICD-10-CM

## 2022-10-30 ENCOUNTER — Telehealth: Payer: Self-pay | Admitting: Internal Medicine

## 2022-10-30 NOTE — Telephone Encounter (Signed)
[employer] called and said they faxed over an authorization request for patient's levothyroxine on 10/25/22. The manufacturer is changing to [facility] and they need the provider's approval for that. Reference number is [id number]. Best callback is [phone].

## 2022-10-31 NOTE — Telephone Encounter (Signed)
Printed and placed in [place] office box

## 2022-11-01 ENCOUNTER — Ambulatory Visit
Admission: RE | Admit: 2022-11-01 | Discharge: 2022-11-01 | Disposition: A | Discharge status: Home / Self Care | Payer: Medicare Other | Source: Ambulatory Visit | Attending: Internal Medicine | Admitting: Internal Medicine

## 2022-11-01 DIAGNOSIS — Z1231 Encounter for screening mammogram for malignant neoplasm of breast: Secondary | ICD-10-CM | POA: Insufficient documentation

## 2022-11-04 ENCOUNTER — Telehealth: Payer: Self-pay | Admitting: Internal Medicine

## 2022-11-04 LAB — HM MAMMOGRAPHY

## 2022-11-04 NOTE — Telephone Encounter (Signed)
Patient said she needs work done to her wheelchair. It needs new wheels, is squeaky, needs 2 new wands, etc. She said she is able to get work done to it at [employer]. Patient provided 2 fax number and 2 phone numbers  Fax:[phone] Fax:[phone]  Phone:[phone] [phone]  Best callback for patient is [phone].

## 2022-11-04 NOTE — Group Note (Deleted)

## 2022-11-05 ENCOUNTER — Ambulatory Visit: Attending: Cardiovascular Disease | Admitting: Cardiovascular Disease

## 2022-11-05 ENCOUNTER — Encounter: Payer: Self-pay | Admitting: Internal Medicine

## 2022-11-05 ENCOUNTER — Encounter: Payer: Self-pay | Admitting: Cardiovascular Disease

## 2022-11-05 VITALS — BP 112/72 | HR 68 | Ht 68.0 in | Wt 189.0 lb

## 2022-11-05 DIAGNOSIS — G4733 Obstructive sleep apnea (adult) (pediatric): Secondary | ICD-10-CM

## 2022-11-05 DIAGNOSIS — E782 Mixed hyperlipidemia: Secondary | ICD-10-CM | POA: Diagnosis not present

## 2022-11-05 DIAGNOSIS — I2510 Atherosclerotic heart disease of native coronary artery without angina pectoris: Secondary | ICD-10-CM | POA: Insufficient documentation

## 2022-11-05 DIAGNOSIS — J9611 Chronic respiratory failure with hypoxia: Secondary | ICD-10-CM

## 2022-11-05 DIAGNOSIS — I10 Essential (primary) hypertension: Secondary | ICD-10-CM

## 2022-11-05 NOTE — Progress Notes (Signed)
11/05/2022 Joan [name]   1960-05-14  [id number]  Primary Physician Joan Broker, MD Primary Cardiologist: Joan Gess MD Joan [name], [place]  HPI:  Joan [name] is a 62 y.o.  moderately overweight widowed Caucasian female mother of 3, grandmother of 5 grandchildren referred by Dr. Okey [name] for cardiovascular evaluation because of chest pressure.  He relocated from the [place] area down to [place] to be closer to family.  She is been disabled since 2000 because of bipolar disorder.  I last saw her in the office 04/17/2021. Her cardiac risk factors include close to 100 pack years of tobacco abuse currently smoking 1 pack/week as well as treated hypertension and hyperlipidemia.  She has a strong family history of heart disease with mother who had stents as well as all 5 sisters.  She is never had a heart attack or stroke.  She is chronically short of breath most likely because of COPD with ongoing tobacco abuse and has complained of chest pressure over the last several months.  She was also told that she had a 50% right carotid artery stenosis by remote ultrasound.   Since I saw her over a year and a half ago she has been hospitalized several times for COPD exacerbation.  She did have a coronary CTA in the past which showed a coronary calcium score in the 700 range with nonobstructive CAD by FFR analysis.  Recent 2D echo performed 6/17 single normal LV systolic function without valvular abnormalities.  S she is on oxygen replacement therapy 24/7 and is primarily wheelchair-bound and/minimally ambulatory.  Current Meds  Medication Sig   Accu-Chek Softclix Lancets lancets Use as instructed   amLODipine (NORVASC) 5 MG tablet Take 1 tablet (5 mg total) by mouth daily.   ascorbic acid (VITAMIN C) 500 MG tablet Take 1 tablet (500 mg total) by mouth 2 (two) times daily.   atenolol (TENORMIN) 25 MG tablet TAKE 1 TABLET BY MOUTH DAILY   atorvastatin (LIPITOR) 20 MG tablet  TAKE 1 TABLET BY MOUTH ONCE  DAILY   Blood Glucose Monitoring Suppl DEVI 1 each by Does not apply route in the morning, at noon, and at bedtime. May substitute to any manufacturer covered by patient's insurance.   buPROPion (WELLBUTRIN XL) 300 MG 24 hr tablet TAKE 1 TABLET BY MOUTH DAILY   cyclobenzaprine (FLEXERIL) 10 MG tablet TAKE 1 TABLET BY MOUTH AT  BEDTIME   diazepam (VALIUM) 5 MG tablet Take 5-10 mg by mouth every 6 (six) hours as needed.   diphenhydrAMINE (BENADRYL) 25 mg capsule Take 25 mg by mouth 2 (two) times daily.   Ensure Max Protein (ENSURE MAX PROTEIN) LIQD Take 330 mLs (11 oz total) by mouth 2 (two) times daily.   guaiFENesin-Codeine 225-7.5 MG/5ML LIQD Take 5 mLs by mouth 3 (three) times daily as needed.   Ipratropium-Albuterol (COMBIVENT RESPIMAT) 20-100 MCG/ACT AERS respimat Inhale 1 puff into the lungs every 6 (six) hours as needed for wheezing or shortness of breath.   levothyroxine (SYNTHROID) 25 MCG tablet TAKE 1 TABLET BY MOUTH DAILY  BEFORE BREAKFAST (Patient taking differently: Take 25 mcg by mouth daily before breakfast.)   MELATONIN PO Take 5 mg by mouth at bedtime as needed (sleep).   montelukast (SINGULAIR) 10 MG tablet TAKE 1 TABLET BY MOUTH AT  BEDTIME (Patient taking differently: Take 10 mg by mouth at bedtime.)   naproxen (NAPROSYN) 500 MG tablet Take 1 tablet (500 mg total) by mouth 2 (two) times  daily with a meal. (Patient taking differently: Take 500 mg by mouth daily as needed for mild pain or headache.)   nystatin-triamcinolone ointment (MYCOLOG) Apply 1 application topically 2 (two) times daily as needed (applied to skin folds/groins/under breast skin irritation rash.).   OXYGEN Inhale 3 L/min into the lungs See admin instructions. Inhale  3 L/min of oxygen into the lungs w/CPAP at bedtime and as needed for shortness of breath with "strenuous activity" during the day   pantoprazole (PROTONIX) 40 MG tablet TAKE 1 TABLET BY MOUTH DAILY (Patient taking  differently: Take 40 mg by mouth daily as needed (For reflux per patient).)   PATADAY 0.2 % SOLN Place 1 drop into both eyes daily.   polyethylene glycol (MIRALAX / GLYCOLAX) 17 g packet Take 17 g by mouth daily as needed for mild constipation.   pregabalin (LYRICA) 300 MG capsule TAKE 1 CAPSULE BY MOUTH TWICE  DAILY   QUEtiapine (SEROQUEL) 100 MG tablet TAKE 1 TABLET BY MOUTH AT  BEDTIME   roflumilast (DALIRESP) 500 MCG TABS tablet Take 1 tablet (500 mcg total) by mouth daily.   silver sulfADIAZINE (SILVADENE) 1 % cream Apply 1 Application topically daily.   traZODone (DESYREL) 100 MG tablet TAKE 1 TABLET BY MOUTH AT  BEDTIME (Patient taking differently: Take 100 mg by mouth at bedtime.)   triamcinolone cream (KENALOG) 0.1 % Apply 1 Application topically 2 (two) times daily. (Patient taking differently: Apply 1 Application topically 2 (two) times daily as needed (for rash).)   venlafaxine XR (EFFEXOR-XR) 150 MG 24 hr capsule TAKE 1 CAPSULE BY MOUTH DAILY  WITH BREAKFAST (Patient taking differently: Take 150 mg by mouth daily with breakfast.)   witch hazel-glycerin (TUCKS) pad Apply topically as needed for itching. (Patient taking differently: Apply 1 Application topically daily as needed for itching.)   zinc sulfate 220 (50 Zn) MG capsule Take 1 capsule (220 mg total) by mouth daily.     Allergies  Allergen Reactions   Keflex [Cephalexin] Rash and Other (See Comments)    Has tolerated amoxicillin since had keflex   Shellfish Allergy Shortness Of Breath, Nausea And Vomiting and Other (See Comments)    Stomach cramps   Tetracyclines & Related Anaphylaxis, Swelling and Other (See Comments)    Throat swelling requiring hospitalization   Dilaudid [Hydromorphone Hcl] Other (See Comments)    "Lethargy"   Prednisone Other (See Comments)    "counter reacts", has tolerated IV medrol   Clindamycin/Lincomycin Other (See Comments)    Burning sensation in her mouth down her throat    Incruse Ellipta  [Umeclidinium Bromide] Nausea Only   Molds & Smuts Itching   Tuberculin Tests Other (See Comments)    False postive   Umeclidinium     Other Reaction(s): GI Intolerance    Social History   Socioeconomic History   Marital status: Widowed    Spouse name: Not on file   Number of children: 3   Years of education: Not on file   Highest education level: 9th grade  Occupational History   Not on file  Tobacco Use   Smoking status: Every Day    Current packs/day: 2.00    Average packs/day: 2.0 packs/day for 47.7 years (95.4 ttl pk-yrs)    Types: Cigarettes    Start date: 1977    Passive exposure: Current   Smokeless tobacco: Never   Tobacco comments:    1ppd as of 07/30/22 ep  Vaping Use   Vaping status: Never Used  Substance  and Sexual Activity   Alcohol use: Not Currently   Drug use: Not Currently    Types: "Crack" cocaine    Comment:  "nothing since 01/12/2012"   Sexual activity: Not Currently    Partners: Male  Other Topics Concern   Not on file  Social History Narrative   Not on file   Social Determinants of Health   Financial Resource Strain: High Risk (08/30/2022)   Overall Financial Resource Strain (CARDIA)    Difficulty of Paying Living Expenses: Hard  Food Insecurity: No Food Insecurity (10/09/2022)   Hunger Vital Sign    Worried About Running Out of Food in the Last Year: Never true    Ran Out of Food in the Last Year: Never true  Recent Concern: Food Insecurity - Food Insecurity Present (08/12/2022)   Hunger Vital Sign    Worried About Running Out of Food in the Last Year: Sometimes true    Ran Out of Food in the Last Year: Sometimes true  Transportation Needs: No Transportation Needs (10/09/2022)   PRAPARE - Administrator, Civil Service (Medical): No    Lack of Transportation (Non-Medical): No  Recent Concern: Transportation Needs - Unmet Transportation Needs (08/30/2022)   PRAPARE - Transportation    Lack of Transportation (Medical): Yes    Lack  of Transportation (Non-Medical): Yes  Physical Activity: Inactive (08/30/2022)   Exercise Vital Sign    Days of Exercise per Week: 0 days    Minutes of Exercise per Session: 0 min  Stress: No Stress Concern Present (08/30/2022)   [employer] of Occupational Health - Occupational Stress Questionnaire    Feeling of Stress : Only a little  Recent Concern: Stress - Stress Concern Present (08/12/2022)   [employer] of Occupational Health - Occupational Stress Questionnaire    Feeling of Stress : Very much  Social Connections: Moderately Isolated (08/30/2022)   Social Connection and Isolation Panel [NHANES]    Frequency of Communication with Friends and Family: Once a week    Frequency of Social Gatherings with Friends and Family: Once a week    Attends Religious Services: 1 to 4 times per year    Active Member of [employer] or Organizations: Yes    Attends Banker Meetings: 1 to 4 times per year    Marital Status: Widowed  Intimate Partner Violence: Not At Risk (10/09/2022)   Humiliation, Afraid, Rape, and Kick questionnaire    Fear of Current or Ex-Partner: No    Emotionally Abused: No    Physically Abused: No    Sexually Abused: No     Review of Systems: General: negative for chills, fever, night sweats or weight changes.  Cardiovascular: negative for chest pain, dyspnea on exertion, edema, orthopnea, palpitations, paroxysmal nocturnal dyspnea or shortness of breath Dermatological: negative for rash Respiratory: negative for cough or wheezing Urologic: negative for hematuria Abdominal: negative for nausea, vomiting, diarrhea, bright red blood per rectum, melena, or hematemesis Neurologic: negative for visual changes, syncope, or dizziness All other systems reviewed and are otherwise negative except as noted above.    Blood pressure 112/72, pulse 68, height 5\' 8"  (1.727 m), weight 189 lb (85.7 kg).  General appearance: alert and no distress Neck: no adenopathy, no  carotid bruit, no JVD, supple, symmetrical, trachea midline, and thyroid not enlarged, symmetric, no tenderness/mass/nodules Lungs: Distant breath sounds Heart: Regular rate and rhythm without murmurs gallops rubs or clicks Extremities: extremities normal, atraumatic, no cyanosis or edema Pulses: 2+ and symmetric  Skin: Skin color, texture, turgor normal. No rashes or lesions Neurologic: Grossly normal  EKG not performed today      ASSESSMENT AND PLAN:   Essential hypertension History of essential hypertension a blood pressure measured today at 112/72.  She is on amlodipine, and atenolol.  Chronic respiratory failure with hypoxia (HCC) Chronic respiratory failure with hypoxia with greater than 80 pack years of tobacco abuse currently smoking a pack a day recalcitrant to risk factor modification.  She is on oxygen with 2024/7.  Mixed hyperlipidemia History of dyslipidemia on atorvastatin with lipid profile performed 06/11/21 revealing total cholesterol 140, LDL 69 HDL 51.  OSA on CPAP History of obstructive sleep apnea on CPAP  Coronary artery disease History of CAD by coronary CTA performed 01/07/2019 with nonobstructive disease and a coronary calcium score 727.  She is asymptomatic with regards to chest pain.     Joan Gess MD FACP,FACC,FAHA, [facility] 11/05/2022 3:01 PM

## 2022-11-05 NOTE — Assessment & Plan Note (Signed)
History of dyslipidemia on atorvastatin with lipid profile performed 06/11/21 revealing total cholesterol 140, LDL 69 HDL 51.

## 2022-11-05 NOTE — Assessment & Plan Note (Signed)
History of CAD by coronary CTA performed 01/07/2019 with nonobstructive disease and a coronary calcium score 727.  She is asymptomatic with regards to chest pain.

## 2022-11-05 NOTE — Patient Instructions (Signed)
Medication Instructions:  Your physician recommends that you continue on your current medications as directed. Please refer to the Current Medication list given to you today.  *If you need a refill on your cardiac medications before your next appointment, please call your pharmacy*   Follow-Up: At [facility], you and your health needs are our priority.  As part of our continuing mission to provide you with exceptional heart care, we have created designated Provider Care Teams.  These Care Teams include your primary Cardiologist (physician) and Advanced Practice Providers (APPs -  Physician Assistants and Nurse Practitioners) who all work together to provide you with the care you need, when you need it.  We recommend signing up for the patient portal called "MyChart".  Sign up information is provided on this After Visit Summary.  MyChart is used to connect with patients for Virtual Visits (Telemedicine).  Patients are able to view lab/test results, encounter notes, upcoming appointments, etc.  Non-urgent messages can be sent to your provider as well.   To learn more about what you can do with MyChart, go to [email or web address].    Your next appointment:   6 month(s)  Provider:   Marjie Skiff, PA-C, Robet Leu, PA-C, Juanda Crumble, PA-C, Azalee Course, PA-C, or Bernadene Person, NP       Then, Nanetta Batty, MD will plan to see you again in 12 month(s).

## 2022-11-05 NOTE — Assessment & Plan Note (Signed)
History of obstructive sleep apnea on CPAP. 

## 2022-11-05 NOTE — Assessment & Plan Note (Signed)
History of essential hypertension a blood pressure measured today at 112/72.  She is on amlodipine, and atenolol.

## 2022-11-05 NOTE — Assessment & Plan Note (Signed)
Chronic respiratory failure with hypoxia with greater than 80 pack years of tobacco abuse currently smoking a pack a day recalcitrant to risk factor modification.  She is on oxygen with 2024/7.

## 2022-11-06 NOTE — Telephone Encounter (Signed)
I believe I have already signed forms for this. Can you find and re-fax?

## 2022-11-06 NOTE — Telephone Encounter (Signed)
Please advise about how to handle this?

## 2022-11-06 NOTE — Telephone Encounter (Signed)
I have 3 numbers and I have fax them back to all 3

## 2022-11-07 ENCOUNTER — Telehealth: Payer: Self-pay | Admitting: Internal Medicine

## 2022-11-07 NOTE — Telephone Encounter (Signed)
Pt called stating she need a Rx sent for a power wheel chair. Pt said her old power wheel chair is not working like that and she scared its going to go out on her. Please call pt.

## 2022-11-07 NOTE — Telephone Encounter (Signed)
[employer] is no longer doing power wheel chair and now she has to go through her insurance company to have it fixed

## 2022-11-08 NOTE — Telephone Encounter (Signed)
If we have a form from the new company I can fill out but typically they would have to submit forms to [place] for signing.

## 2022-11-08 NOTE — Telephone Encounter (Signed)
I have re fax back the papers from [employer] regarding the wheel chair and also spoke with patient and inform her of this and she stated [employer] no longer deal with power wheel chairs. Should I schedule patient for a virtual or in office to have this addressed for other options?

## 2022-11-08 NOTE — Telephone Encounter (Signed)
There is a prior phone note about this I have not heard back on, can you check on that note?

## 2022-11-11 ENCOUNTER — Encounter (HOSPITAL_BASED_OUTPATIENT_CLINIC_OR_DEPARTMENT_OTHER): Payer: Self-pay | Admitting: Pulmonary Disease

## 2022-11-11 ENCOUNTER — Ambulatory Visit (INDEPENDENT_AMBULATORY_CARE_PROVIDER_SITE_OTHER): Admitting: Pulmonary Disease

## 2022-11-11 VITALS — BP 142/90 | HR 66 | Ht 68.0 in | Wt 189.0 lb

## 2022-11-11 DIAGNOSIS — J431 Panlobular emphysema: Secondary | ICD-10-CM

## 2022-11-11 DIAGNOSIS — J9611 Chronic respiratory failure with hypoxia: Secondary | ICD-10-CM | POA: Diagnosis not present

## 2022-11-11 DIAGNOSIS — G4733 Obstructive sleep apnea (adult) (pediatric): Secondary | ICD-10-CM

## 2022-11-11 NOTE — Assessment & Plan Note (Signed)
She will qualify for a replacement CPAP machine at 10 cm  Weight loss encouraged, compliance with goal of at least 4-6 hrs every night is the expectation. Advised against medications with sedative side effects Cautioned against driving when sleepy - understanding that sleepiness will vary on a day to day basis

## 2022-11-11 NOTE — Assessment & Plan Note (Signed)
She will continue on oxygen 24/7.  She has oxygen blended into her CPAP

## 2022-11-11 NOTE — Patient Instructions (Addendum)
Flu shot  X Replacement CPAP 10 cm to [facility]  Refills from [place] & me

## 2022-11-11 NOTE — Assessment & Plan Note (Signed)
She she does not seem to be having an exacerbation. She will continue on Breztri.  I have asked her to switch from Combivent to albuterol MDI so as not to get double dose of anticholinergic side effects such as dryness of mouth

## 2022-11-11 NOTE — Telephone Encounter (Signed)
Ok correction patients states that she was told that she just need a prescription written for a powered wheel chair

## 2022-11-11 NOTE — Progress Notes (Signed)
Subjective:    Patient ID: Joan [name], female    DOB: 06/10/1960, 62 y.o.   MRN: [id number]  HPI  62 yo smoker for follow-up of severe COPD and OSA -on CPAP 10 cm -Enrolled in home hospice   Meds -allergy listed to prednisone-this caused pedal edema, prefers Medrol when needed  PMH : HTN CAD - calcium score 727.    Chief Complaint  Patient presents with   Follow-up    Pt states she is doing fine but is having dry mouth from cpap. She has recently just been using her o2 at night    Interim: She had an exacerbation 09/2022 when she was treated with antibiotics and steroids with improvement. She remains on Breztri which she has been able to obtain from the company. She has moved to [facility], still enrolled in home hospice. She reports excessive dryness of mouth in the morning and cottonball like feeling in her mouth.  She stopped using CPAP and complains of excessive tiredness. She complains of stiffness in her lungs, cough productive of clear sputum Blood pressure is slight high today    Significant tests/ events reviewed NPSG 02/2017 >> no OSA HST  04/2017 15/h     02/2019 pFTS >> RATIO 45, fev 1 37% - UNCHANGED , + bd response, DLCO 44%      10/2016 PFTs showed ratio 49, FEV1 of 36%, FVC 58% and DLCO of 34% consistent with severe obstruction.   04/2021 LDCT >> stable, RADS 2   02/2020 LDCT >>  stable nodules  11/2016 CT chest bullous changes BL, 4mm RUl & 2 mm LUL  nodules >> stable      ONO 11/2016 on CPAP/room air showed desaturation for 1 hour 45 minutes started on nocturnal oxygen>>  Review of Systems neg for any significant sore throat, dysphagia, itching, sneezing, nasal congestion or excess/ purulent secretions, fever, chills, sweats, unintended wt loss, pleuritic or exertional cp, hempoptysis, orthopnea pnd or change in chronic leg swelling. Also denies presyncope, palpitations, heartburn, abdominal pain, nausea, vomiting, diarrhea or change in bowel or  urinary habits, dysuria,hematuria, rash, arthralgias, visual complaints, headache, numbness weakness or ataxia.     Objective:   Physical Exam  Gen. Pleasant, obese, in no distress ENT - no lesions, no post nasal drip Neck: No JVD, no thyromegaly, no carotid bruits Lungs: no use of accessory muscles, no dullness to percussion, decreased without rales or rhonchi  Cardiovascular: Rhythm regular, heart sounds  normal, no murmurs or gallops, no peripheral edema Musculoskeletal: No deformities, no cyanosis or clubbing , no tremors       Assessment & Plan:   Princ[place] remains on hospice care with high symptom burden.  She does have somewhat end-stage COPD but seems to be holding up okay.  She does not appear to be terminal by any means.  Not sure what is causing her current symptoms.  She does not seem to be having a COPD exacerbation.  We have been discussing some of the tools to manage her COPD symptoms.  I did not feel like she needs opiates for symptom reduction at this time

## 2022-11-12 NOTE — Telephone Encounter (Signed)
We can't just write a prescription for an electric wheelchair. Typically they would have a form to repair her current device.

## 2022-11-13 NOTE — Telephone Encounter (Signed)
LVM for patient to call the new company and request a form for [place] to fill out for her wheel chair repair

## 2022-11-15 ENCOUNTER — Telehealth: Payer: Self-pay | Admitting: Internal Medicine

## 2022-11-15 ENCOUNTER — Other Ambulatory Visit: Payer: Self-pay | Admitting: Internal Medicine

## 2022-11-15 NOTE — Telephone Encounter (Signed)
Pt states she's having issues and was wondering if her nurse can reach out to her regarding the issue.She's concern about her electric wheelchair and medication advice as well.

## 2022-11-16 ENCOUNTER — Other Ambulatory Visit: Payer: Self-pay | Admitting: Internal Medicine

## 2022-11-17 ENCOUNTER — Other Ambulatory Visit: Payer: Self-pay | Admitting: Internal Medicine

## 2022-11-19 NOTE — Telephone Encounter (Signed)
Pt states that she was told that she need for the provider to write out why she needs the chair and her conditions for the chair

## 2022-11-20 NOTE — Telephone Encounter (Signed)
Clarify for me: she already has the chair and it needs repairs or she is trying to get a new electric wheelchair? We cannot get a new electric wheelchair with a piece of paper. Typically she would need to go through whole process to qualify for the electric wheelchair again including PT assessment. If she is getting repairs we would need company providing repairs to send [place] a form to sign off on.

## 2022-11-22 NOTE — Telephone Encounter (Signed)
Pt has an appointment on 10.07.2024 with you. I guess she will bring it up then in the appointment.Marland Kitchen

## 2022-11-25 ENCOUNTER — Other Ambulatory Visit: Payer: Self-pay | Admitting: Internal Medicine

## 2022-11-29 ENCOUNTER — Telehealth: Payer: Self-pay | Admitting: Pulmonary Disease

## 2022-11-29 NOTE — Telephone Encounter (Signed)
PT is in Hospice and wonders if she is healthy enough to fly. She has family in [place]. She has O2 and I told her to look at the TSA website to check those restrictions. Her # is [phone] Please call to let her know. TY.

## 2022-12-02 ENCOUNTER — Ambulatory Visit (INDEPENDENT_AMBULATORY_CARE_PROVIDER_SITE_OTHER): Payer: Medicare Other | Admitting: Internal Medicine

## 2022-12-02 ENCOUNTER — Encounter: Payer: Self-pay | Admitting: Internal Medicine

## 2022-12-02 ENCOUNTER — Telehealth: Payer: Self-pay

## 2022-12-02 VITALS — BP 122/80 | HR 92 | Temp 97.6°F | Ht 68.0 in | Wt 180.0 lb

## 2022-12-02 DIAGNOSIS — Z515 Encounter for palliative care: Secondary | ICD-10-CM | POA: Diagnosis not present

## 2022-12-02 DIAGNOSIS — J439 Emphysema, unspecified: Secondary | ICD-10-CM | POA: Diagnosis not present

## 2022-12-02 DIAGNOSIS — E1140 Type 2 diabetes mellitus with diabetic neuropathy, unspecified: Secondary | ICD-10-CM

## 2022-12-02 NOTE — Progress Notes (Unsigned)
Subjective:   Patient ID: Joan [name], female    DOB: [birth year], 62 y.o.   MRN: [id number]  HPI The patient is a 62 YO female coming in for ongoing chronic concerns. Several recent hospitalizations and is stable overall. Still under hospice care.   Review of Systems  Constitutional:  Positive for activity change, appetite change and fatigue.  HENT: Negative.    Eyes: Negative.   Respiratory:  Positive for cough, chest tightness and shortness of breath.   Cardiovascular:  Negative for chest pain, palpitations and leg swelling.  Gastrointestinal:  Positive for abdominal pain and nausea. Negative for abdominal distention, constipation, diarrhea and vomiting.  Musculoskeletal:  Positive for arthralgias and myalgias.  Skin: Negative.   Neurological:  Positive for dizziness, light-headedness and headaches.  Psychiatric/Behavioral: Negative.      Objective:  Physical Exam Constitutional:      Appearance: She is well-developed.  HENT:     Head: Normocephalic and atraumatic.  Cardiovascular:     Rate and Rhythm: Normal rate and regular rhythm.  Pulmonary:     Effort: Pulmonary effort is normal. No respiratory distress.     Breath sounds: Rhonchi present. No wheezing or rales.     Comments: Oxygen not in place during visit, lung exam stable from prior Abdominal:     General: Bowel sounds are normal. There is no distension.     Palpations: Abdomen is soft.     Tenderness: There is no abdominal tenderness. There is no rebound.  Musculoskeletal:     Cervical back: Normal range of motion.  Skin:    General: Skin is warm and dry.  Neurological:     Mental Status: She is alert and oriented to person, place, and time.     Motor: Weakness present.     Coordination: Coordination abnormal.     Vitals:   12/02/22 1122  BP: 122/80  Pulse: 92  Temp: 97.6 F (36.4 C)  TempSrc: Oral  SpO2: 97%  Weight: 180 lb (81.6 kg)  Height: 5\' 8"  (1.727 m)    Assessment & Plan:  Visit  time 25 minutes in face to face communication with patient and coordination of care, additional 7 minutes spent in record review, coordination or care, ordering tests, communicating/referring to other healthcare professionals, documenting in medical records all on the same day of the visit for total time 32 minutes spent on the visit.

## 2022-12-02 NOTE — Patient Instructions (Signed)
Try the miralax again for the constipation.

## 2022-12-02 NOTE — Telephone Encounter (Signed)
Pt states that she an appointment with someone coming out to her house on Oct 17 to have them fix her wheel chair.

## 2022-12-03 DIAGNOSIS — H5203 Hypermetropia, bilateral: Secondary | ICD-10-CM | POA: Diagnosis not present

## 2022-12-03 DIAGNOSIS — E099 Drug or chemical induced diabetes mellitus without complications: Secondary | ICD-10-CM | POA: Diagnosis not present

## 2022-12-03 DIAGNOSIS — H25813 Combined forms of age-related cataract, bilateral: Secondary | ICD-10-CM | POA: Diagnosis not present

## 2022-12-03 LAB — HM DIABETES EYE EXAM

## 2022-12-04 ENCOUNTER — Encounter: Payer: Self-pay | Admitting: Internal Medicine

## 2022-12-04 NOTE — Telephone Encounter (Addendum)
Called and spoke to patient.  Patient answered and was exteremly SOB. She was unable to complete full sentence without stopping to caught her breath. She checked her oxygen during call and spo2 was 95% on 3.5L. she stated that hospice nurse came out today and evaluated her.  She reports of prod cough with tan sputum and increased SOB. She stated sx have been present for weeks but worsen in the afternoon.  She also mentioned that she has almost fallen a few times today.  Patient is alone at home.   She also mentioned that she thinking about flying. She wanting Dr. Reginia Naas thoughts on this? She is wanting to know if she is healthy enough to fly.    [name], please advise. Dr. Vassie Loll is unavailable. Thanks

## 2022-12-04 NOTE — Telephone Encounter (Signed)
12/06/2022 10:00 appt has been taken.   Spoke to patient and relayed below message/recommendations. She stated that she would present to ED if sx worsened.  She is not sure when she plans to fly. Once she decides if she is going to fly or not, she will call back to schedule sooner appt then 02/2023.  Routing to [name] and Dr. Vassie Loll as an Lorain Childes

## 2022-12-05 ENCOUNTER — Encounter (HOSPITAL_COMMUNITY): Payer: Self-pay

## 2022-12-05 ENCOUNTER — Emergency Department (HOSPITAL_COMMUNITY)

## 2022-12-05 ENCOUNTER — Inpatient Hospital Stay (HOSPITAL_COMMUNITY)
Admission: EM | Admit: 2022-12-05 | Discharge: 2022-12-07 | DRG: 190 | Disposition: A | Discharge status: Hospice | Attending: Family Medicine | Admitting: Family Medicine

## 2022-12-05 ENCOUNTER — Other Ambulatory Visit: Payer: Self-pay

## 2022-12-05 DIAGNOSIS — F32A Depression, unspecified: Secondary | ICD-10-CM | POA: Diagnosis present

## 2022-12-05 DIAGNOSIS — Z96643 Presence of artificial hip joint, bilateral: Secondary | ICD-10-CM | POA: Diagnosis present

## 2022-12-05 DIAGNOSIS — S199XXA Unspecified injury of neck, initial encounter: Secondary | ICD-10-CM | POA: Diagnosis not present

## 2022-12-05 DIAGNOSIS — K219 Gastro-esophageal reflux disease without esophagitis: Secondary | ICD-10-CM | POA: Diagnosis not present

## 2022-12-05 DIAGNOSIS — E876 Hypokalemia: Secondary | ICD-10-CM | POA: Diagnosis present

## 2022-12-05 DIAGNOSIS — I517 Cardiomegaly: Secondary | ICD-10-CM | POA: Diagnosis not present

## 2022-12-05 DIAGNOSIS — M533 Sacrococcygeal disorders, not elsewhere classified: Secondary | ICD-10-CM | POA: Diagnosis present

## 2022-12-05 DIAGNOSIS — Z981 Arthrodesis status: Secondary | ICD-10-CM | POA: Diagnosis not present

## 2022-12-05 DIAGNOSIS — I119 Hypertensive heart disease without heart failure: Secondary | ICD-10-CM | POA: Diagnosis present

## 2022-12-05 DIAGNOSIS — Z887 Allergy status to serum and vaccine status: Secondary | ICD-10-CM

## 2022-12-05 DIAGNOSIS — J441 Chronic obstructive pulmonary disease with (acute) exacerbation: Secondary | ICD-10-CM | POA: Diagnosis not present

## 2022-12-05 DIAGNOSIS — S0990XA Unspecified injury of head, initial encounter: Secondary | ICD-10-CM | POA: Diagnosis not present

## 2022-12-05 DIAGNOSIS — E7800 Pure hypercholesterolemia, unspecified: Secondary | ICD-10-CM | POA: Diagnosis not present

## 2022-12-05 DIAGNOSIS — I10 Essential (primary) hypertension: Secondary | ICD-10-CM | POA: Diagnosis present

## 2022-12-05 DIAGNOSIS — I700 Atherosclerosis of aorta: Secondary | ICD-10-CM | POA: Diagnosis not present

## 2022-12-05 DIAGNOSIS — Z79899 Other long term (current) drug therapy: Secondary | ICD-10-CM | POA: Diagnosis not present

## 2022-12-05 DIAGNOSIS — Z91048 Other nonmedicinal substance allergy status: Secondary | ICD-10-CM

## 2022-12-05 DIAGNOSIS — R0989 Other specified symptoms and signs involving the circulatory and respiratory systems: Secondary | ICD-10-CM | POA: Diagnosis not present

## 2022-12-05 DIAGNOSIS — M4807 Spinal stenosis, lumbosacral region: Secondary | ICD-10-CM | POA: Diagnosis not present

## 2022-12-05 DIAGNOSIS — W19XXXA Unspecified fall, initial encounter: Secondary | ICD-10-CM | POA: Diagnosis not present

## 2022-12-05 DIAGNOSIS — G4733 Obstructive sleep apnea (adult) (pediatric): Secondary | ICD-10-CM | POA: Diagnosis not present

## 2022-12-05 DIAGNOSIS — M47816 Spondylosis without myelopathy or radiculopathy, lumbar region: Secondary | ICD-10-CM | POA: Diagnosis not present

## 2022-12-05 DIAGNOSIS — E039 Hypothyroidism, unspecified: Secondary | ICD-10-CM | POA: Diagnosis not present

## 2022-12-05 DIAGNOSIS — I447 Left bundle-branch block, unspecified: Secondary | ICD-10-CM | POA: Diagnosis not present

## 2022-12-05 DIAGNOSIS — E1140 Type 2 diabetes mellitus with diabetic neuropathy, unspecified: Secondary | ICD-10-CM | POA: Diagnosis not present

## 2022-12-05 DIAGNOSIS — Y92009 Unspecified place in unspecified non-institutional (private) residence as the place of occurrence of the external cause: Secondary | ICD-10-CM

## 2022-12-05 DIAGNOSIS — I6523 Occlusion and stenosis of bilateral carotid arteries: Secondary | ICD-10-CM | POA: Diagnosis not present

## 2022-12-05 DIAGNOSIS — J439 Emphysema, unspecified: Secondary | ICD-10-CM | POA: Diagnosis present

## 2022-12-05 DIAGNOSIS — G9341 Metabolic encephalopathy: Secondary | ICD-10-CM | POA: Diagnosis not present

## 2022-12-05 DIAGNOSIS — Z9851 Tubal ligation status: Secondary | ICD-10-CM

## 2022-12-05 DIAGNOSIS — Z1152 Encounter for screening for COVID-19: Secondary | ICD-10-CM

## 2022-12-05 DIAGNOSIS — J9611 Chronic respiratory failure with hypoxia: Secondary | ICD-10-CM | POA: Diagnosis present

## 2022-12-05 DIAGNOSIS — F17210 Nicotine dependence, cigarettes, uncomplicated: Secondary | ICD-10-CM | POA: Diagnosis present

## 2022-12-05 DIAGNOSIS — Z79890 Hormone replacement therapy (postmenopausal): Secondary | ICD-10-CM

## 2022-12-05 DIAGNOSIS — G9340 Encephalopathy, unspecified: Secondary | ICD-10-CM | POA: Diagnosis not present

## 2022-12-05 DIAGNOSIS — F4310 Post-traumatic stress disorder, unspecified: Secondary | ICD-10-CM | POA: Diagnosis present

## 2022-12-05 DIAGNOSIS — F320 Major depressive disorder, single episode, mild: Secondary | ICD-10-CM | POA: Diagnosis not present

## 2022-12-05 DIAGNOSIS — F411 Generalized anxiety disorder: Secondary | ICD-10-CM | POA: Diagnosis present

## 2022-12-05 DIAGNOSIS — R0602 Shortness of breath: Secondary | ICD-10-CM | POA: Diagnosis not present

## 2022-12-05 DIAGNOSIS — F319 Bipolar disorder, unspecified: Secondary | ICD-10-CM | POA: Diagnosis present

## 2022-12-05 DIAGNOSIS — Z743 Need for continuous supervision: Secondary | ICD-10-CM | POA: Diagnosis not present

## 2022-12-05 DIAGNOSIS — F17200 Nicotine dependence, unspecified, uncomplicated: Secondary | ICD-10-CM | POA: Diagnosis not present

## 2022-12-05 DIAGNOSIS — E871 Hypo-osmolality and hyponatremia: Secondary | ICD-10-CM | POA: Insufficient documentation

## 2022-12-05 DIAGNOSIS — Z91013 Allergy to seafood: Secondary | ICD-10-CM

## 2022-12-05 DIAGNOSIS — Z885 Allergy status to narcotic agent status: Secondary | ICD-10-CM

## 2022-12-05 DIAGNOSIS — Z9049 Acquired absence of other specified parts of digestive tract: Secondary | ICD-10-CM

## 2022-12-05 DIAGNOSIS — Z8249 Family history of ischemic heart disease and other diseases of the circulatory system: Secondary | ICD-10-CM | POA: Diagnosis not present

## 2022-12-05 DIAGNOSIS — M549 Dorsalgia, unspecified: Secondary | ICD-10-CM | POA: Diagnosis not present

## 2022-12-05 DIAGNOSIS — R0902 Hypoxemia: Secondary | ICD-10-CM | POA: Diagnosis not present

## 2022-12-05 DIAGNOSIS — Z833 Family history of diabetes mellitus: Secondary | ICD-10-CM | POA: Diagnosis not present

## 2022-12-05 DIAGNOSIS — Z881 Allergy status to other antibiotic agents status: Secondary | ICD-10-CM

## 2022-12-05 LAB — URINALYSIS, ROUTINE W REFLEX MICROSCOPIC
Bilirubin Urine: NEGATIVE
Glucose, UA: NEGATIVE mg/dL
Hgb urine dipstick: NEGATIVE
Ketones, ur: NEGATIVE mg/dL
Leukocytes,Ua: NEGATIVE
Nitrite: NEGATIVE
Protein, ur: NEGATIVE mg/dL
Specific Gravity, Urine: 1.01 (ref 1.005–1.030)
pH: 7 (ref 5.0–8.0)

## 2022-12-05 LAB — BLOOD GAS, VENOUS
Acid-Base Excess: 10.1 mmol/L — ABNORMAL HIGH (ref 0.0–2.0)
Bicarbonate: 39 mmol/L — ABNORMAL HIGH (ref 20.0–28.0)
Drawn by: 5497
O2 Saturation: 84.3 %
Patient temperature: 36.4
pCO2, Ven: 64 mm[Hg] — ABNORMAL HIGH (ref 44–60)
pH, Ven: 7.39 (ref 7.25–7.43)
pO2, Ven: 50 mm[Hg] — ABNORMAL HIGH (ref 32–45)

## 2022-12-05 LAB — BASIC METABOLIC PANEL
Anion gap: 9 (ref 5–15)
BUN: 11 mg/dL (ref 8–23)
CO2: 33 mmol/L — ABNORMAL HIGH (ref 22–32)
Calcium: 9 mg/dL (ref 8.9–10.3)
Chloride: 86 mmol/L — ABNORMAL LOW (ref 98–111)
Creatinine, Ser: 0.72 mg/dL (ref 0.44–1.00)
GFR, Estimated: >60 mL/min (ref >60)
Glucose, Bld: 103 mg/dL — ABNORMAL HIGH (ref 70–99)
Potassium: 2.8 mmol/L — ABNORMAL LOW (ref 3.5–5.1)
Sodium: 128 mmol/L — ABNORMAL LOW (ref 135–145)

## 2022-12-05 LAB — CBC WITH DIFFERENTIAL/PLATELET
Abs Immature Granulocytes: 0.03 10*3/uL (ref 0.00–0.07)
Basophils Absolute: 0.1 10*3/uL (ref 0.0–0.1)
Basophils Relative: 1 %
Eosinophils Absolute: 0.3 10*3/uL (ref 0.0–0.5)
Eosinophils Relative: 3 %
HCT: 35.3 % — ABNORMAL LOW (ref 36.0–46.0)
Hemoglobin: 11.4 g/dL — ABNORMAL LOW (ref 12.0–15.0)
Immature Granulocytes: 0 %
Lymphocytes Relative: 23 %
Lymphs Abs: 2.5 10*3/uL (ref 0.7–4.0)
MCH: 28.9 pg (ref 26.0–34.0)
MCHC: 32.3 g/dL (ref 30.0–36.0)
MCV: 89.4 fL (ref 80.0–100.0)
Monocytes Absolute: 0.9 10*3/uL (ref 0.1–1.0)
Monocytes Relative: 8 %
Neutro Abs: 7.2 10*3/uL (ref 1.7–7.7)
Neutrophils Relative %: 65 %
Platelets: 272 10*3/uL (ref 150–400)
RBC: 3.95 MIL/uL (ref 3.87–5.11)
RDW: 14.4 % (ref 11.5–15.5)
WBC: 11 10*3/uL — ABNORMAL HIGH (ref 4.0–10.5)
nRBC: 0 % (ref 0.0–0.2)

## 2022-12-05 LAB — RAPID URINE DRUG SCREEN, HOSP PERFORMED
Amphetamines: NOT DETECTED
Barbiturates: NOT DETECTED
Benzodiazepines: POSITIVE — AB
Cocaine: NOT DETECTED
Opiates: POSITIVE — AB
Tetrahydrocannabinol: NOT DETECTED

## 2022-12-05 LAB — BRAIN NATRIURETIC PEPTIDE: B Natriuretic Peptide: 55 pg/mL (ref 0.0–100.0)

## 2022-12-05 LAB — TROPONIN I (HIGH SENSITIVITY)
Troponin I (High Sensitivity): 3 ng/L (ref <18)
Troponin I (High Sensitivity): 3 ng/L (ref <18)

## 2022-12-05 LAB — MAGNESIUM: Magnesium: 1.8 mg/dL (ref 1.7–2.4)

## 2022-12-05 LAB — SARS CORONAVIRUS 2 BY RT PCR: SARS Coronavirus 2 by RT PCR: NEGATIVE

## 2022-12-05 MED ORDER — ALBUTEROL SULFATE (2.5 MG/3ML) 0.083% IN NEBU
2.5000 mg | INHALATION_SOLUTION | Freq: Once | RESPIRATORY_TRACT | Status: AC
  Administered 2022-12-05: 2.5 mg via RESPIRATORY_TRACT
  Filled 2022-12-05: qty 3

## 2022-12-05 MED ORDER — POTASSIUM CHLORIDE 20 MEQ PO PACK
40.0000 meq | PACK | Freq: Once | ORAL | Status: AC
  Administered 2022-12-05: 40 meq via ORAL
  Filled 2022-12-05: qty 2

## 2022-12-05 MED ORDER — MAGNESIUM SULFATE 2 GM/50ML IV SOLN
2.0000 g | Freq: Once | INTRAVENOUS | Status: AC
  Administered 2022-12-05: 2 g via INTRAVENOUS
  Filled 2022-12-05: qty 50

## 2022-12-05 MED ORDER — METHYLPREDNISOLONE SODIUM SUCC 125 MG IJ SOLR
125.0000 mg | Freq: Once | INTRAMUSCULAR | Status: AC
  Administered 2022-12-05: 125 mg via INTRAVENOUS
  Filled 2022-12-05: qty 2

## 2022-12-05 NOTE — Assessment & Plan Note (Signed)
She does continue to struggle with basic ADLs due to dyspnea. She is under hospice care and continues to smoke which continues to worsen her overall condition.

## 2022-12-05 NOTE — ED Provider Notes (Signed)
[facility] EMERGENCY DEPARTMENT AT [facility] Provider Note   CSN: [id number] Arrival date & time: 12/05/22  1930     History  Chief Complaint  Patient presents with   Joan [name] is a 62 y.o. female.  She is brought in by ambulance from home.  She lives alone.  She said she is fallen multiple times over the last few days.  She said her left leg gave out on her and since then she has been unable to safely ambulate.  She said she is injured her coccyx from a fall.  She denies any loss of consciousness.  She has baseline shortness of breath and is on oxygen, continues to smoke.  She denies any chest pain or abdominal pain.  The history is provided by the patient and the EMS personnel.  Fall This is a recurrent problem. The current episode started yesterday. The problem occurs daily. The problem has not changed since onset.Associated symptoms include shortness of breath. Pertinent negatives include no chest pain, no abdominal pain and no headaches. The symptoms are aggravated by walking. Nothing relieves the symptoms. She has tried rest for the symptoms. The treatment provided no relief.       Home Medications Prior to Admission medications   Medication Sig Start Date End Date Taking? Authorizing Provider  Accu-Chek Softclix Lancets lancets USE TO CHECK BLOOD SUGAR IN THE  [place], AT NOON, AND AT BEDTIME 11/15/22   Myrlene Broker, MD  amLODipine (NORVASC) 5 MG tablet Take 1 tablet (5 mg total) by mouth daily. 10/11/22 01/09/23  Narda Bonds, MD  atenolol (TENORMIN) 25 MG tablet TAKE 1 TABLET BY MOUTH DAILY 09/18/22   Myrlene Broker, MD  atorvastatin (LIPITOR) 20 MG tablet TAKE 1 TABLET BY MOUTH ONCE  DAILY 07/23/22   Myrlene Broker, MD  Blood Glucose Monitoring Suppl DEVI 1 each by Does not apply route in the morning, at noon, and at bedtime. May substitute to any manufacturer covered by patient's insurance. 08/28/22   Myrlene Broker,  MD  buPROPion (WELLBUTRIN XL) 300 MG 24 hr tablet TAKE 1 TABLET BY MOUTH DAILY 06/04/22   Thresa Ross, MD  cyclobenzaprine (FLEXERIL) 10 MG tablet TAKE 1 TABLET BY MOUTH AT  BEDTIME 07/31/22   Myrlene Broker, MD  diazepam (VALIUM) 5 MG tablet Take 5-10 mg by mouth every 6 (six) hours as needed. 09/18/22   [provider]  diphenhydrAMINE (BENADRYL) 25 mg capsule Take 25 mg by mouth 2 (two) times daily.    [provider]  fluticasone (FLONASE) 50 MCG/ACT nasal spray Place 2 sprays into both nostrils daily. Patient taking differently: Place 2 sprays into both nostrils daily as needed for allergies. 09/05/21 02/07/22  Alfonse Spruce, MD  guaiFENesin-Codeine 225-7.5 MG/5ML LIQD Take 5 mLs by mouth 3 (three) times daily as needed. 07/30/22   Oretha Milch, MD  Ipratropium-Albuterol (COMBIVENT RESPIMAT) 20-100 MCG/ACT AERS respimat Inhale 1 puff into the lungs every 6 (six) hours as needed for wheezing or shortness of breath. 04/02/22   Bevelyn Ngo, NP  levothyroxine (SYNTHROID) 25 MCG tablet TAKE 1 TABLET BY MOUTH DAILY  BEFORE BREAKFAST Patient taking differently: Take 25 mcg by mouth daily before breakfast. 07/23/22   Myrlene Broker, MD  MELATONIN PO Take 5 mg by mouth at bedtime as needed (sleep).    [provider]  montelukast (SINGULAIR) 10 MG tablet TAKE 1 TABLET BY MOUTH AT  BEDTIME  Patient taking differently: Take 10 mg by mouth at bedtime. 07/23/22   Oretha Milch, MD  naproxen (NAPROSYN) 500 MG tablet Take 1 tablet (500 mg total) by mouth 2 (two) times daily with a meal. Patient taking differently: Take 500 mg by mouth daily as needed for mild pain or headache. 03/29/22   Myrlene Broker, MD  nystatin-triamcinolone ointment [facility]) Apply 1 application topically 2 (two) times daily as needed (applied to skin folds/groins/under breast skin irritation rash.). 09/08/17   Myrlene Broker, MD  OXYGEN Inhale 3 L/min into the lungs See admin  instructions. Inhale  3 L/min of oxygen into the lungs w/CPAP at bedtime and as needed for shortness of breath with "strenuous activity" during the day    [provider]  pantoprazole (PROTONIX) 40 MG tablet TAKE 1 TABLET BY MOUTH DAILY Patient taking differently: Take 40 mg by mouth daily as needed (For reflux per patient). 12/21/21   Myrlene Broker, MD  PATADAY 0.2 % SOLN Place 1 drop into both eyes daily. 08/20/19   [provider]  polyethylene glycol (MIRALAX / GLYCOLAX) 17 g packet Take 17 g by mouth daily as needed for mild constipation. 04/14/19   Meuth, Brooke A, PA-C  pregabalin (LYRICA) 300 MG capsule TAKE 1 CAPSULE BY MOUTH TWICE  DAILY 11/25/22   Myrlene Broker, MD  QUEtiapine (SEROQUEL) 100 MG tablet TAKE 1 TABLET BY MOUTH AT  BEDTIME 07/31/22   Thresa Ross, MD  roflumilast (DALIRESP) 500 MCG TABS tablet Take 1 tablet (500 mcg total) by mouth daily. 04/02/22   Bevelyn Ngo, NP  silver sulfADIAZINE (SILVADENE) 1 % cream Apply 1 Application topically daily. 10/09/22   Felecia Shelling, DPM  traZODone (DESYREL) 100 MG tablet TAKE 1 TABLET BY MOUTH AT  BEDTIME Patient taking differently: Take 100 mg by mouth at bedtime. 12/21/21   Myrlene Broker, MD  triamcinolone cream (KENALOG) 0.1 % Apply 1 Application topically 2 (two) times daily. Patient taking differently: Apply 1 Application topically 2 (two) times daily as needed (for rash). 08/20/22   Myrlene Broker, MD  venlafaxine XR (EFFEXOR-XR) 150 MG 24 hr capsule TAKE 1 CAPSULE BY MOUTH DAILY  WITH BREAKFAST Patient taking differently: Take 150 mg by mouth daily with breakfast. 12/21/21   Myrlene Broker, MD  witch hazel-glycerin (TUCKS) pad Apply topically as needed for itching. Patient taking differently: Apply 1 Application topically daily as needed for itching. 08/18/20   Standley Brooking, MD      Allergies    Keflex [cephalexin], Shellfish allergy, Tetracyclines & related, Dilaudid  [hydromorphone hcl], Prednisone, Clindamycin/lincomycin, Incruse ellipta [umeclidinium bromide], Molds & smuts, Tuberculin tests, and Umeclidinium    Review of Systems   Review of Systems  Respiratory:  Positive for shortness of breath.   Cardiovascular:  Negative for chest pain.  Gastrointestinal:  Negative for abdominal pain.  Musculoskeletal:  Positive for back pain and gait problem.  Neurological:  Negative for headaches.    Physical Exam Updated Vital Signs BP (!) 132/112   Pulse 63   Temp 97.6 F (36.4 C) (Oral)   Resp 16   Ht 5\' 8"  (1.727 m)   Wt 81.6 kg   SpO2 100%   BMI 27.37 kg/m  Physical Exam Vitals and nursing note reviewed.  Constitutional:      General: She is not in acute distress.    Appearance: Normal appearance. She is well-developed.  HENT:     Head: Normocephalic and atraumatic.  Eyes:     Conjunctiva/sclera: Conjunctivae normal.  Cardiovascular:     Rate and Rhythm: Normal rate and regular rhythm.     Heart sounds: No murmur heard. Pulmonary:     Effort: Pulmonary effort is normal. No respiratory distress.     Breath sounds: Wheezing present.  Abdominal:     Palpations: Abdomen is soft.     Tenderness: There is no abdominal tenderness. There is no guarding or rebound.  Musculoskeletal:        General: No deformity. Normal range of motion.     Cervical back: Neck supple.  Skin:    General: Skin is warm and dry.     Capillary Refill: Capillary refill takes less than 2 seconds.  Neurological:     General: No focal deficit present.     Mental Status: She is alert.     Motor: No weakness.     ED Results / Procedures / Treatments   Labs (all labs ordered are listed, but only abnormal results are displayed) Labs Reviewed  BASIC METABOLIC PANEL - Abnormal; Notable for the following components:      Result Value   Sodium 128 (*)    Potassium 2.8 (*)    Chloride 86 (*)    CO2 33 (*)    Glucose, Bld 103 (*)    All other components within  normal limits  CBC WITH DIFFERENTIAL/PLATELET - Abnormal; Notable for the following components:   WBC 11.0 (*)    Hemoglobin 11.4 (*)    HCT 35.3 (*)    All other components within normal limits  BLOOD GAS, VENOUS - Abnormal; Notable for the following components:   pCO2, Ven 64 (*)    pO2, Ven 50 (*)    Bicarbonate 39.0 (*)    Acid-Base Excess 10.1 (*)    All other components within normal limits  RAPID URINE DRUG SCREEN, HOSP PERFORMED - Abnormal; Notable for the following components:   Opiates POSITIVE (*)    Benzodiazepines POSITIVE (*)    All other components within normal limits  COMPREHENSIVE METABOLIC PANEL - Abnormal; Notable for the following components:   Sodium 130 (*)    Chloride 90 (*)    Glucose, Bld 256 (*)    Calcium 8.5 (*)    All other components within normal limits  CBC WITH DIFFERENTIAL/PLATELET - Abnormal; Notable for the following components:   WBC 11.4 (*)    Neutro Abs 10.4 (*)    All other components within normal limits  SARS CORONAVIRUS 2 BY RT PCR  EXPECTORATED SPUTUM ASSESSMENT W GRAM STAIN, RFLX TO RESP C  BRAIN NATRIURETIC PEPTIDE  URINALYSIS, ROUTINE W REFLEX MICROSCOPIC  MAGNESIUM  MAGNESIUM  TROPONIN I (HIGH SENSITIVITY)  TROPONIN I (HIGH SENSITIVITY)    EKG EKG Interpretation Date/Time:  Thursday December 05 2022 20:18:06 EDT Ventricular Rate:  64 PR Interval:  185 QRS Duration:  116 QT Interval:  494 QTC Calculation: 510 R Axis:   93  Text Interpretation: Sinus rhythm Nonspecific intraventricular conduction delay Nonspecific T abnrm, anterolateral leads No significant change since prior 8/24 Confirmed by Meridee Score [phone]) on 12/05/2022 8:23:59 PM  Radiology CT Lumbar Spine Wo Contrast  Result Date: 12/05/2022 CLINICAL DATA:  Ataxia, lumbar trauma.  Multiple falls EXAM: CT LUMBAR SPINE WITHOUT CONTRAST TECHNIQUE: Multidetector CT imaging of the lumbar spine was performed without intravenous contrast administration.  Multiplanar CT image reconstructions were also generated. RADIATION DOSE REDUCTION: This exam was performed according to the departmental dose-optimization program  which includes automated exposure control, adjustment of the mA and/or kV according to patient size and/or use of iterative reconstruction technique. COMPARISON:  None Available. FINDINGS: Segmentation: 5 lumbar type vertebrae. Alignment: Normal. Vertebrae: L5-S1 posterolateral surgical hardware. Multilevel mild degenerative changes spine. No acute fracture or focal pathologic process. Paraspinal and other soft tissues: Negative. Disc levels: Intervertebral disc space vacuum phenomenon at the L4-L5 level. Severe intervertebral disc space narrowing at the L5-S1 level. Other: Atherosclerotic plaque. IMPRESSION: No acute displaced fracture or traumatic listhesis of the lumbar spine. Electronically Signed   By: Tish Frederickson M.D.   On: 12/05/2022 21:38   CT Head Wo Contrast  Result Date: 12/05/2022 CLINICAL DATA:  Head trauma, abnormal mental status (Age 63-64y); Neck trauma, intoxicated or obtunded (Age >= 16y) Pt says she has been falling for 2-3 days and has been somewhat forgetful. EXAM: CT HEAD WITHOUT CONTRAST CT CERVICAL SPINE WITHOUT CONTRAST TECHNIQUE: Multidetector CT imaging of the head and cervical spine was performed following the standard protocol without intravenous contrast. Multiplanar CT image reconstructions of the cervical spine were also generated. RADIATION DOSE REDUCTION: This exam was performed according to the departmental dose-optimization program which includes automated exposure control, adjustment of the mA and/or kV according to patient size and/or use of iterative reconstruction technique. COMPARISON:  None Available. FINDINGS: CT HEAD FINDINGS Brain: No evidence of large-territorial acute infarction. No parenchymal hemorrhage. No mass lesion. No extra-axial collection. No mass effect or midline shift. No hydrocephalus.  Basilar cisterns are patent. Vascular: No hyperdense vessel. Atherosclerotic calcifications are present within the cavernous internal carotid arteries. Skull: No acute fracture or focal lesion. Sinuses/Orbits: Paranasal sinuses and mastoid air cells are clear. The orbits are unremarkable. Other: None. CT CERVICAL SPINE FINDINGS Alignment: Normal. Skull base and vertebrae: Anterior C3-C4 anterior cervical discectomy and fusion. Multilevel severe degenerative changes of the spine. Multilevel severe osseous neural foraminal stenosis. No severe osseous central canal stenosis. No acute fracture. No aggressive appearing focal osseous lesion or focal pathologic process. Soft tissues and spinal canal: No prevertebral fluid or swelling. No visible canal hematoma. Upper chest: Unremarkable. Other: None. IMPRESSION: 1. No acute intracranial abnormality. 2. No acute displaced fracture or traumatic listhesis of the cervical spine. Electronically Signed   By: Tish Frederickson M.D.   On: 12/05/2022 21:36   CT Cervical Spine Wo Contrast  Result Date: 12/05/2022 CLINICAL DATA:  Head trauma, abnormal mental status (Age 58-64y); Neck trauma, intoxicated or obtunded (Age >= 16y) Pt says she has been falling for 2-3 days and has been somewhat forgetful. EXAM: CT HEAD WITHOUT CONTRAST CT CERVICAL SPINE WITHOUT CONTRAST TECHNIQUE: Multidetector CT imaging of the head and cervical spine was performed following the standard protocol without intravenous contrast. Multiplanar CT image reconstructions of the cervical spine were also generated. RADIATION DOSE REDUCTION: This exam was performed according to the departmental dose-optimization program which includes automated exposure control, adjustment of the mA and/or kV according to patient size and/or use of iterative reconstruction technique. COMPARISON:  None Available. FINDINGS: CT HEAD FINDINGS Brain: No evidence of large-territorial acute infarction. No parenchymal hemorrhage. No  mass lesion. No extra-axial collection. No mass effect or midline shift. No hydrocephalus. Basilar cisterns are patent. Vascular: No hyperdense vessel. Atherosclerotic calcifications are present within the cavernous internal carotid arteries. Skull: No acute fracture or focal lesion. Sinuses/Orbits: Paranasal sinuses and mastoid air cells are clear. The orbits are unremarkable. Other: None. CT CERVICAL SPINE FINDINGS Alignment: Normal. Skull base and vertebrae: Anterior C3-C4 anterior cervical discectomy and  fusion. Multilevel severe degenerative changes of the spine. Multilevel severe osseous neural foraminal stenosis. No severe osseous central canal stenosis. No acute fracture. No aggressive appearing focal osseous lesion or focal pathologic process. Soft tissues and spinal canal: No prevertebral fluid or swelling. No visible canal hematoma. Upper chest: Unremarkable. Other: None. IMPRESSION: 1. No acute intracranial abnormality. 2. No acute displaced fracture or traumatic listhesis of the cervical spine. Electronically Signed   By: Tish Frederickson M.D.   On: 12/05/2022 21:36   DG Chest Port 1 View  Result Date: 12/05/2022 CLINICAL DATA:  Shortness of breath and multiple falls EXAM: PORTABLE CHEST 1 VIEW COMPARISON:  10/09/2022 FINDINGS: Stable cardiomegaly. Aortic atherosclerotic calcification. Fat pad at the cardiac apex. Pulmonary vascular congestion. No focal consolidation, pleural effusion, or pneumothorax. No displaced rib fractures. IMPRESSION: Cardiomegaly and pulmonary vascular congestion. Electronically Signed   By: Minerva Fester M.D.   On: 12/05/2022 21:24    Procedures Procedures    Medications Ordered in ED Medications  amLODipine (NORVASC) tablet 5 mg (has no administration in time range)  atenolol (TENORMIN) tablet 25 mg (has no administration in time range)  atorvastatin (LIPITOR) tablet 20 mg (has no administration in time range)  buPROPion (WELLBUTRIN XL) 24 hr tablet 300 mg  (has no administration in time range)  venlafaxine XR (EFFEXOR-XR) 24 hr capsule 150 mg (has no administration in time range)  levothyroxine (SYNTHROID) tablet 25 mcg (has no administration in time range)  pantoprazole (PROTONIX) EC tablet 40 mg (has no administration in time range)  heparin injection 5,000 Units (5,000 Units Subcutaneous Given 12/06/22 0613)  methylPREDNISolone sodium succinate (SOLU-MEDROL) 125 mg/2 mL injection 125 mg (has no administration in time range)    Followed by  predniSONE (DELTASONE) tablet 40 mg (has no administration in time range)  fluticasone furoate-vilanterol (BREO ELLIPTA) 100-25 MCG/ACT 1 puff (1 puff Inhalation Given 12/06/22 0930)  ipratropium-albuterol (DUONEB) 0.5-2.5 (3) MG/3ML nebulizer solution 3 mL (3 mLs Nebulization Given 12/06/22 0925)  albuterol (PROVENTIL) (2.5 MG/3ML) 0.083% nebulizer solution 2.5 mg (has no administration in time range)  acetaminophen (TYLENOL) tablet 650 mg (has no administration in time range)    Or  acetaminophen (TYLENOL) suppository 650 mg (has no administration in time range)  oxyCODONE (Oxy IR/ROXICODONE) immediate release tablet 5 mg (5 mg Oral Given 12/06/22 0403)  ondansetron (ZOFRAN) tablet 4 mg (has no administration in time range)    Or  ondansetron (ZOFRAN) injection 4 mg (has no administration in time range)  diazepam (VALIUM) tablet 2 mg (has no administration in time range)  pregabalin (LYRICA) capsule 100 mg (has no administration in time range)  insulin aspart (novoLOG) injection 0-5 Units (has no administration in time range)  insulin aspart (novoLOG) injection 6 Units (has no administration in time range)  insulin aspart (novoLOG) injection 0-20 Units ( Subcutaneous Not Given 12/06/22 0754)  potassium chloride (KLOR-CON) packet 40 mEq (40 mEq Oral Given 12/05/22 2300)  albuterol (PROVENTIL) (2.5 MG/3ML) 0.083% nebulizer solution 2.5 mg (2.5 mg Nebulization Given 12/05/22 2208)  methylPREDNISolone  sodium succinate (SOLU-MEDROL) 125 mg/2 mL injection 125 mg (125 mg Intravenous Given 12/05/22 2301)  magnesium sulfate IVPB 2 g 50 mL (0 g Intravenous Stopped 12/05/22 2359)  potassium chloride 10 mEq in 100 mL IVPB (10 mEq Intravenous New Bag/Given 12/06/22 0732)  furosemide (LASIX) injection 40 mg (40 mg Intravenous Given 12/06/22 7829)    ED Course/ Medical Decision Making/ A&P Clinical Course as of 12/06/22 1003  Thu Dec 05, 2022  2141  Spine x-rays do not show any acute findings.  Have removed her cervical collar.  She is audibly wheezing so we will get her a neb.  CT lumbar spine does not show any acute fracture. [MB]  2255 Patient continues to wheeze although I think this is mostly upper airway.  She is still quite somnolent and her VBG shows some increased CO2 although her pH is normal.  This is likely mostly chronic although there may be slight worsening of it.  Feel she probably would benefit from mission to the hospital for for continued respiratory management.  Discussed with Dr. Dorthula Perfect [facility] hospitalist who will evaluate patient for admission. [MB]    Clinical Course User Index [MB] Terrilee Files, MD                                 Medical Decision Making Amount and/or Complexity of Data Reviewed Labs: ordered. Radiology: ordered.  Risk Prescription drug management. Decision regarding hospitalization.   This patient complains of weakness falls shortness of breath; this involves an extensive number of treatment Options and is a complaint that carries with it a high risk of complications and morbidity. The differential includes COPD, pneumonia, fracture, failure to thrive, metabolic derangement  I ordered, reviewed and interpreted labs, which included CBC with mildly elevated white count, stable hemoglobin, chemistries with low sodium low potassium, VBG with CO2 retention but normal pH likely compensated, COVID-negative, urinalysis without signs of infection, troponins  and BNP unremarkable I ordered medication breathing treatments steroids magnesium potassium and reviewed PMP when indicated. I ordered imaging studies which included chest x-ray, CT head and cervical spine CT lumbar spine and I independently    visualized and interpreted imaging which showed possible congestive heart failure, no acute fracture Additional history obtained from EMS Previous records obtained and reviewed in epic, patient admitted back in August for COPD exacerbation also multiple visits for falls I consulted [facility] hospitalist Dr. Dorthula Perfect And discussed lab and imaging findings and discussed disposition.  Cardiac monitoring reviewed, sinus rhythm Social determinants considered, ongoing tobacco use physically inactive financial strain Critical Interventions: None  After the interventions stated above, I reevaluated the patient and found patient still to be quite lethargic and audibly wheezy Admission and further testing considered, she would benefit from mission the hospital for further management of possible COPD exacerbation along with her unsafe ambulation and somnolence.         Final Clinical Impression(s) / ED Diagnoses Final diagnoses:  COPD with acute exacerbation (HCC)  Fall, initial encounter  Coccyx pain  Acute metabolic encephalopathy    Rx / DC Orders ED Discharge Orders     None         Terrilee Files, MD 12/06/22 1007

## 2022-12-05 NOTE — ED Notes (Signed)
C-collar previously placed by EMS removed by assigned MD.

## 2022-12-05 NOTE — Assessment & Plan Note (Signed)
Refilled lyrica which is helping somewhat with her symptoms.

## 2022-12-05 NOTE — ED Triage Notes (Addendum)
Arrives [facility] from home for reported multiple falls for several days.   Pt says she has been falling for 2-3 days and has been somewhat forgetful.   Home cleaning employee made notification with a passing by police officer to notify paramedics of falls.   Complaint of lower back pain.   6mg  morphine administered by EMS. 3L [place] at baseline for COPD.

## 2022-12-05 NOTE — Assessment & Plan Note (Signed)
No current exacerbation. She was not using oxygen during our visit although it was with her. She does not use while stationary. She is supposed to use 24/7. She is taking combivent nebs and daliresp. Advised to quit smoking and is unable to make attempt at this time.

## 2022-12-06 ENCOUNTER — Inpatient Hospital Stay (HOSPITAL_COMMUNITY)

## 2022-12-06 DIAGNOSIS — E039 Hypothyroidism, unspecified: Secondary | ICD-10-CM | POA: Diagnosis not present

## 2022-12-06 DIAGNOSIS — G9340 Encephalopathy, unspecified: Secondary | ICD-10-CM | POA: Diagnosis not present

## 2022-12-06 DIAGNOSIS — J9611 Chronic respiratory failure with hypoxia: Secondary | ICD-10-CM

## 2022-12-06 DIAGNOSIS — G9341 Metabolic encephalopathy: Secondary | ICD-10-CM | POA: Diagnosis not present

## 2022-12-06 DIAGNOSIS — J441 Chronic obstructive pulmonary disease with (acute) exacerbation: Secondary | ICD-10-CM | POA: Diagnosis not present

## 2022-12-06 DIAGNOSIS — W19XXXA Unspecified fall, initial encounter: Secondary | ICD-10-CM | POA: Diagnosis not present

## 2022-12-06 DIAGNOSIS — F17200 Nicotine dependence, unspecified, uncomplicated: Secondary | ICD-10-CM

## 2022-12-06 DIAGNOSIS — I340 Nonrheumatic mitral (valve) insufficiency: Secondary | ICD-10-CM | POA: Diagnosis not present

## 2022-12-06 DIAGNOSIS — F411 Generalized anxiety disorder: Secondary | ICD-10-CM

## 2022-12-06 DIAGNOSIS — E871 Hypo-osmolality and hyponatremia: Secondary | ICD-10-CM | POA: Diagnosis not present

## 2022-12-06 DIAGNOSIS — I119 Hypertensive heart disease without heart failure: Secondary | ICD-10-CM | POA: Diagnosis present

## 2022-12-06 DIAGNOSIS — Z833 Family history of diabetes mellitus: Secondary | ICD-10-CM | POA: Diagnosis not present

## 2022-12-06 DIAGNOSIS — E7800 Pure hypercholesterolemia, unspecified: Secondary | ICD-10-CM | POA: Diagnosis present

## 2022-12-06 DIAGNOSIS — I10 Essential (primary) hypertension: Secondary | ICD-10-CM

## 2022-12-06 DIAGNOSIS — R29818 Other symptoms and signs involving the nervous system: Secondary | ICD-10-CM | POA: Diagnosis not present

## 2022-12-06 DIAGNOSIS — K219 Gastro-esophageal reflux disease without esophagitis: Secondary | ICD-10-CM | POA: Diagnosis not present

## 2022-12-06 DIAGNOSIS — J439 Emphysema, unspecified: Secondary | ICD-10-CM | POA: Diagnosis present

## 2022-12-06 DIAGNOSIS — F320 Major depressive disorder, single episode, mild: Secondary | ICD-10-CM

## 2022-12-06 DIAGNOSIS — Z96643 Presence of artificial hip joint, bilateral: Secondary | ICD-10-CM | POA: Diagnosis present

## 2022-12-06 DIAGNOSIS — Z79899 Other long term (current) drug therapy: Secondary | ICD-10-CM | POA: Diagnosis not present

## 2022-12-06 DIAGNOSIS — Y92009 Unspecified place in unspecified non-institutional (private) residence as the place of occurrence of the external cause: Secondary | ICD-10-CM

## 2022-12-06 DIAGNOSIS — E1140 Type 2 diabetes mellitus with diabetic neuropathy, unspecified: Secondary | ICD-10-CM | POA: Diagnosis present

## 2022-12-06 DIAGNOSIS — F319 Bipolar disorder, unspecified: Secondary | ICD-10-CM | POA: Diagnosis present

## 2022-12-06 DIAGNOSIS — Z8249 Family history of ischemic heart disease and other diseases of the circulatory system: Secondary | ICD-10-CM | POA: Diagnosis not present

## 2022-12-06 DIAGNOSIS — M533 Sacrococcygeal disorders, not elsewhere classified: Secondary | ICD-10-CM | POA: Diagnosis present

## 2022-12-06 DIAGNOSIS — F17210 Nicotine dependence, cigarettes, uncomplicated: Secondary | ICD-10-CM | POA: Diagnosis present

## 2022-12-06 DIAGNOSIS — Z981 Arthrodesis status: Secondary | ICD-10-CM | POA: Diagnosis not present

## 2022-12-06 DIAGNOSIS — Z1152 Encounter for screening for COVID-19: Secondary | ICD-10-CM | POA: Diagnosis not present

## 2022-12-06 DIAGNOSIS — F4310 Post-traumatic stress disorder, unspecified: Secondary | ICD-10-CM | POA: Diagnosis present

## 2022-12-06 DIAGNOSIS — Z79890 Hormone replacement therapy (postmenopausal): Secondary | ICD-10-CM | POA: Diagnosis not present

## 2022-12-06 DIAGNOSIS — E876 Hypokalemia: Secondary | ICD-10-CM

## 2022-12-06 DIAGNOSIS — G4733 Obstructive sleep apnea (adult) (pediatric): Secondary | ICD-10-CM | POA: Diagnosis present

## 2022-12-06 LAB — CBC WITH DIFFERENTIAL/PLATELET
Abs Immature Granulocytes: 0.06 10*3/uL (ref 0.00–0.07)
Basophils Absolute: 0 10*3/uL (ref 0.0–0.1)
Basophils Relative: 0 %
Eosinophils Absolute: 0 10*3/uL (ref 0.0–0.5)
Eosinophils Relative: 0 %
HCT: 40.1 % (ref 36.0–46.0)
Hemoglobin: 12.8 g/dL (ref 12.0–15.0)
Immature Granulocytes: 1 %
Lymphocytes Relative: 7 %
Lymphs Abs: 0.8 10*3/uL (ref 0.7–4.0)
MCH: 28.8 pg (ref 26.0–34.0)
MCHC: 31.9 g/dL (ref 30.0–36.0)
MCV: 90.1 fL (ref 80.0–100.0)
Monocytes Absolute: 0.1 10*3/uL (ref 0.1–1.0)
Monocytes Relative: 1 %
Neutro Abs: 10.4 10*3/uL — ABNORMAL HIGH (ref 1.7–7.7)
Neutrophils Relative %: 91 %
Platelets: 271 10*3/uL (ref 150–400)
RBC: 4.45 MIL/uL (ref 3.87–5.11)
RDW: 14.3 % (ref 11.5–15.5)
WBC: 11.4 10*3/uL — ABNORMAL HIGH (ref 4.0–10.5)
nRBC: 0 % (ref 0.0–0.2)

## 2022-12-06 LAB — MAGNESIUM: Magnesium: 2.2 mg/dL (ref 1.7–2.4)

## 2022-12-06 LAB — ECHOCARDIOGRAM COMPLETE
AR max vel: 2.72 cm2
AV Area VTI: 2.91 cm2
AV Area mean vel: 2.91 cm2
AV Mean grad: 4.6 mm[Hg]
AV Peak grad: 9.2 mm[Hg]
Ao pk vel: 1.51 m/s
Area-P 1/2: 2.62 cm2
Height: 68 in
MV M vel: 4.49 m/s
MV Peak grad: 80.6 mm[Hg]
S' Lateral: 3.2 cm
Weight: 2880 [oz_av]

## 2022-12-06 LAB — GLUCOSE, CAPILLARY
Glucose-Capillary: 344 mg/dL — ABNORMAL HIGH (ref 70–99)
Glucose-Capillary: 301 mg/dL — ABNORMAL HIGH (ref 70–99)
Glucose-Capillary: 248 mg/dL — ABNORMAL HIGH (ref 70–99)

## 2022-12-06 LAB — COMPREHENSIVE METABOLIC PANEL
ALT: 17 U/L (ref 0–44)
AST: 19 U/L (ref 15–41)
Albumin: 3.7 g/dL (ref 3.5–5.0)
Alkaline Phosphatase: 120 U/L (ref 38–126)
Anion gap: 10 (ref 5–15)
BUN: 10 mg/dL (ref 8–23)
CO2: 30 mmol/L (ref 22–32)
Calcium: 8.5 mg/dL — ABNORMAL LOW (ref 8.9–10.3)
Chloride: 90 mmol/L — ABNORMAL LOW (ref 98–111)
Creatinine, Ser: 0.84 mg/dL (ref 0.44–1.00)
GFR, Estimated: >60 mL/min (ref >60)
Glucose, Bld: 256 mg/dL — ABNORMAL HIGH (ref 70–99)
Potassium: 3.8 mmol/L (ref 3.5–5.1)
Sodium: 130 mmol/L — ABNORMAL LOW (ref 135–145)
Total Bilirubin: 0.4 mg/dL (ref 0.3–1.2)
Total Protein: 6.6 g/dL (ref 6.5–8.1)

## 2022-12-06 MED ORDER — PREDNISONE 20 MG PO TABS
40.0000 mg | ORAL_TABLET | Freq: Every day | ORAL | Status: DC
  Administered 2022-12-07: 40 mg via ORAL
  Filled 2022-12-06 (×2): qty 2

## 2022-12-06 MED ORDER — POTASSIUM CHLORIDE 10 MEQ/100ML IV SOLN
10.0000 meq | INTRAVENOUS | Status: AC
  Administered 2022-12-06 (×4): 10 meq via INTRAVENOUS
  Filled 2022-12-06 (×4): qty 100

## 2022-12-06 MED ORDER — PANTOPRAZOLE SODIUM 40 MG PO TBEC: 40.0000 mg | DELAYED_RELEASE_TABLET | Freq: Every day | ORAL | Status: DC | PRN

## 2022-12-06 MED ORDER — ONDANSETRON HCL 4 MG PO TABS: 4.0000 mg | ORAL_TABLET | Freq: Four times a day (QID) | ORAL | Status: DC | PRN

## 2022-12-06 MED ORDER — INSULIN ASPART 100 UNIT/ML IJ SOLN
0.0000 [IU] | Freq: Three times a day (TID) | INTRAMUSCULAR | Status: DC
  Administered 2022-12-06: 3 [IU] via SUBCUTANEOUS
  Administered 2022-12-06 (×2): 15 [IU] via SUBCUTANEOUS
  Administered 2022-12-07: 11 [IU] via SUBCUTANEOUS

## 2022-12-06 MED ORDER — FLUTICASONE FUROATE-VILANTEROL 100-25 MCG/ACT IN AEPB
1.0000 | INHALATION_SPRAY | Freq: Every day | RESPIRATORY_TRACT | Status: DC
  Administered 2022-12-06 – 2022-12-07 (×2): 1 via RESPIRATORY_TRACT
  Filled 2022-12-06: qty 28

## 2022-12-06 MED ORDER — INSULIN ASPART 100 UNIT/ML IJ SOLN
0.0000 [IU] | Freq: Every day | INTRAMUSCULAR | Status: DC
  Administered 2022-12-06: 2 [IU] via SUBCUTANEOUS

## 2022-12-06 MED ORDER — AMLODIPINE BESYLATE 5 MG PO TABS
5.0000 mg | ORAL_TABLET | Freq: Every day | ORAL | Status: DC
  Administered 2022-12-06 – 2022-12-07 (×2): 5 mg via ORAL
  Filled 2022-12-06 (×2): qty 1

## 2022-12-06 MED ORDER — INSULIN ASPART 100 UNIT/ML IJ SOLN: 0.0000 [IU] | Freq: Three times a day (TID) | INTRAMUSCULAR | Status: DC

## 2022-12-06 MED ORDER — INSULIN ASPART 100 UNIT/ML IJ SOLN
6.0000 [IU] | Freq: Three times a day (TID) | INTRAMUSCULAR | Status: DC
  Administered 2022-12-06 (×2): 6 [IU] via SUBCUTANEOUS

## 2022-12-06 MED ORDER — ACETAMINOPHEN 325 MG PO TABS: 650.0000 mg | ORAL_TABLET | Freq: Four times a day (QID) | ORAL | Status: DC | PRN

## 2022-12-06 MED ORDER — ACETAMINOPHEN 650 MG RE SUPP: 650.0000 mg | Freq: Four times a day (QID) | RECTAL | Status: DC | PRN

## 2022-12-06 MED ORDER — OXYCODONE HCL 5 MG PO TABS
5.0000 mg | ORAL_TABLET | ORAL | Status: DC | PRN
  Administered 2022-12-06 (×4): 5 mg via ORAL
  Filled 2022-12-06 (×4): qty 1

## 2022-12-06 MED ORDER — FUROSEMIDE 10 MG/ML IJ SOLN
40.0000 mg | Freq: Once | INTRAMUSCULAR | Status: AC
  Administered 2022-12-06: 40 mg via INTRAVENOUS
  Filled 2022-12-06: qty 4

## 2022-12-06 MED ORDER — PREGABALIN 50 MG PO CAPS
100.0000 mg | ORAL_CAPSULE | Freq: Two times a day (BID) | ORAL | Status: DC
  Administered 2022-12-06 – 2022-12-07 (×3): 100 mg via ORAL
  Filled 2022-12-06 (×3): qty 2

## 2022-12-06 MED ORDER — INSULIN GLARGINE-YFGN 100 UNIT/ML ~~LOC~~ SOLN
15.0000 [IU] | Freq: Every day | SUBCUTANEOUS | Status: DC
  Administered 2022-12-06: 15 [IU] via SUBCUTANEOUS
  Filled 2022-12-06 (×2): qty 0.15

## 2022-12-06 MED ORDER — GUAIFENESIN-DM 100-10 MG/5ML PO SYRP
5.0000 mL | ORAL_SOLUTION | ORAL | Status: DC | PRN
  Administered 2022-12-06: 5 mL via ORAL
  Filled 2022-12-06: qty 5

## 2022-12-06 MED ORDER — HEPARIN SODIUM (PORCINE) 5000 UNIT/ML IJ SOLN
5000.0000 [IU] | Freq: Three times a day (TID) | INTRAMUSCULAR | Status: DC
  Administered 2022-12-06 – 2022-12-07 (×4): 5000 [IU] via SUBCUTANEOUS
  Filled 2022-12-06 (×4): qty 1

## 2022-12-06 MED ORDER — DIAZEPAM 2 MG PO TABS
2.0000 mg | ORAL_TABLET | Freq: Four times a day (QID) | ORAL | Status: DC | PRN
  Administered 2022-12-07: 2 mg via ORAL
  Filled 2022-12-06: qty 1

## 2022-12-06 MED ORDER — LEVOTHYROXINE SODIUM 25 MCG PO TABS
25.0000 ug | ORAL_TABLET | Freq: Every day | ORAL | Status: DC
  Administered 2022-12-06 – 2022-12-07 (×2): 25 ug via ORAL
  Filled 2022-12-06 (×2): qty 1

## 2022-12-06 MED ORDER — ONDANSETRON HCL 4 MG/2ML IJ SOLN: 4.0000 mg | Freq: Four times a day (QID) | INTRAMUSCULAR | Status: DC | PRN

## 2022-12-06 MED ORDER — ATORVASTATIN CALCIUM 20 MG PO TABS
20.0000 mg | ORAL_TABLET | Freq: Every day | ORAL | Status: DC
  Administered 2022-12-06 – 2022-12-07 (×2): 20 mg via ORAL
  Filled 2022-12-06 (×2): qty 1

## 2022-12-06 MED ORDER — METHYLPREDNISOLONE SODIUM SUCC 125 MG IJ SOLR
125.0000 mg | Freq: Two times a day (BID) | INTRAMUSCULAR | Status: AC
  Administered 2022-12-06 (×2): 125 mg via INTRAVENOUS
  Filled 2022-12-06 (×2): qty 2

## 2022-12-06 MED ORDER — BUPROPION HCL ER (XL) 300 MG PO TB24
300.0000 mg | ORAL_TABLET | Freq: Every day | ORAL | Status: DC
  Administered 2022-12-06 – 2022-12-07 (×2): 300 mg via ORAL
  Filled 2022-12-06 (×2): qty 1

## 2022-12-06 MED ORDER — IPRATROPIUM-ALBUTEROL 0.5-2.5 (3) MG/3ML IN SOLN
3.0000 mL | Freq: Four times a day (QID) | RESPIRATORY_TRACT | Status: DC
  Administered 2022-12-06 – 2022-12-07 (×6): 3 mL via RESPIRATORY_TRACT
  Filled 2022-12-06 (×6): qty 3

## 2022-12-06 MED ORDER — VENLAFAXINE HCL ER 75 MG PO CP24
150.0000 mg | ORAL_CAPSULE | Freq: Every day | ORAL | Status: DC
  Administered 2022-12-06 – 2022-12-07 (×2): 150 mg via ORAL
  Filled 2022-12-06 (×2): qty 2

## 2022-12-06 MED ORDER — ALBUTEROL SULFATE (2.5 MG/3ML) 0.083% IN NEBU
2.5000 mg | INHALATION_SOLUTION | RESPIRATORY_TRACT | Status: DC | PRN
  Administered 2022-12-07: 2.5 mg via RESPIRATORY_TRACT
  Filled 2022-12-06: qty 3

## 2022-12-06 MED ORDER — ATENOLOL 25 MG PO TABS
25.0000 mg | ORAL_TABLET | Freq: Every day | ORAL | Status: DC
  Administered 2022-12-06 – 2022-12-07 (×2): 25 mg via ORAL
  Filled 2022-12-06 (×2): qty 1

## 2022-12-06 NOTE — Evaluation (Signed)
Physical Therapy Evaluation Patient Details Name: Joan [name] MRN: [id number] DOB: 04/11/1960 Today's Date: 12/06/2022  History of Present Illness  Joan [name] is a 62 y.o. female with medical history significant of chronic respiratory failure on 3-1/2 L at baseline, COPD, cigarette smoker, generalized anxiety disorder, depression, GERD, hep C, hyperlipidemia, hypertension, hypothyroidism, and more presents the ED with a chief complaint of fall.  Patient reports she became dizzy and fell.  She had felt dizzy all day.  She reports she also felt confused and disoriented.  She thinks she did hit her head.  She denies taking any blood thinners.  She denies any asymmetrical numbness.  She does report left leg weakness at the time of her fall.  Strength seems equal on my exam.  Patient reports that her left leg weakness had been present for a week prior to her fall.  She reports she has had a decreased appetite.  She only eats 1 meal per day.  That is when Meals on Wheels comes.  She said this is because she does not like to cook.  She think she has been losing weight but she is not sure how much.  She has never noticed herself wheezing even though it is unclear if she is wheezing during exam, even from across the room.  She reports a cough that is productive of yellow sputum.  She has had no fevers.  She uses her scheduled inhaler as prescribed.  She reports using her rescue inhaler 3-4 times per day.  She has no hemoptysis.  She has no chest pain.  Patient does note swelling in her legs but is not sure when it started.  She has no other complaints at this time. (per DO)     Clinical Impression  On therapist arrival; patient is sitting on toilet assisted there by nursing.  Patient needs min to mod A to boost up from toilet to RW and ambulates with CGA to chair. Needs cues for technique and hand placement to safely sit in the chair.  Patient complains of back pain 5-8/10 during assessment.  She  demonstrates forward flexed posturing in standing with RW; needs RW due to leg and general weakness.  patient left in chair with chair alarm set and  with call button in reach and nursing notified of mobility status. Patient will benefit from continued skilled therapy services during the remainder of her hospital stay and at the next recommended venue of care to address deficits and promote return to optimal function.               If plan is discharge home, recommend the following: A little help with walking and/or transfers;A little help with bathing/dressing/bathroom;Assistance with cooking/housework;Help with stairs or ramp for entrance   Can travel by private vehicle        Equipment Recommendations None recommended by PT  Recommendations for Other Services       Functional Status Assessment Patient has had a recent decline in their functional status and demonstrates the ability to make significant improvements in function in a reasonable and predictable amount of time.     Precautions / Restrictions Precautions Precautions: Fall Restrictions Weight Bearing Restrictions: No      Mobility  Bed Mobility               General bed mobility comments: Pt sitting on toilet at start of session. Patient Response: Cooperative, Anxious  Transfers Overall transfer level: Needs assistance Equipment used: Rolling walker (  2 wheels) Transfers: Bed to chair/wheelchair/BSC, Sit to/from Stand Sit to Stand: Contact guard assist, Min assist   Step pivot transfers: Contact guard assist, Min assist       General transfer comment: Min A to boost from toilet. More CGA once standing. Improved sit to stand from chair.    Ambulation/Gait Ambulation/Gait assistance: Contact guard assist Gait Distance (Feet): 15 Feet Assistive device: Rolling walker (2 wheels) Gait Pattern/deviations: Trunk flexed, Decreased step length - left, Decreased step length - right Gait velocity:  decreased        Stairs            Wheelchair Mobility     Tilt Bed Tilt Bed Patient Response: Cooperative, Anxious  Modified Rankin (Stroke Patients Only)       Balance Overall balance assessment: Needs assistance Sitting-balance support: No upper extremity supported, Feet supported Sitting balance-Leahy Scale: Good Sitting balance - Comments: seated at EOB   Standing balance support: Bilateral upper extremity supported, During functional activity, Reliant on assistive device for balance Standing balance-Leahy Scale: Fair Standing balance comment: using RW                             Pertinent Vitals/Pain Pain Assessment Pain Assessment: 0-10 Pain Score: 8  Pain Location: low back Pain Intervention(s): Limited activity within patient's tolerance, Monitored during session, Repositioned    Home Living Family/patient expects to be discharged to:: Private residence Living Arrangements: Alone Available Help at Discharge: Available PRN/intermittently;Personal care attendant Type of Home: Apartment Home Access: Ramped entrance       Home Layout: One level Home Equipment: Rollator (4 wheels);Cane - single point;Shower seat;Wheelchair - manual;BSC/3in1;Electric scooter      Prior Function Prior Level of Function : Needs assist       Physical Assist : ADLs (physical);Mobility (physical) Mobility (physical): Transfers;Gait ADLs (physical): Dressing;Bathing;IADLs Mobility Comments: w/c used within the community; RW used in the house. ADLs Comments: Aide assists 3X/wk with bathing/dressing. Pt requires assist for LB ADLs.     Extremity/Trunk Assessment   Upper Extremity Assessment Upper Extremity Assessment: Defer to OT evaluation    Lower Extremity Assessment Lower Extremity Assessment: Generalized weakness    Cervical / Trunk Assessment Cervical / Trunk Assessment: Kyphotic  Communication   Communication Communication: No apparent  difficulties  Cognition Arousal: Alert Behavior During Therapy: WFL for tasks assessed/performed Overall Cognitive Status: No family/caregiver present to determine baseline cognitive functioning                                          General Comments      Exercises     Assessment/Plan    PT Assessment Patient needs continued PT services  PT Problem List Decreased strength;Decreased activity tolerance;Decreased balance;Decreased mobility;Pain       PT Treatment Interventions Gait training;Functional mobility training;Therapeutic activities;Therapeutic exercise;Balance training;Patient/family education    PT Goals (Current goals can be found in the Care Plan section)  Acute Rehab PT Goals Patient Stated Goal: return home PT Goal Formulation: With patient Time For Goal Achievement: 12/20/22 Potential to Achieve Goals: Good    Frequency Min 2X/week     Co-evaluation   Reason for Co-Treatment: To address functional/ADL transfers   OT goals addressed during session: ADL's and self-care       AM-PAC PT "6 Clicks" Mobility  Outcome Measure  Help needed turning from your back to your side while in a flat bed without using bedrails?: A Little Help needed moving from lying on your back to sitting on the side of a flat bed without using bedrails?: A Little Help needed moving to and from a bed to a chair (including a wheelchair)?: A Little Help needed standing up from a chair using your arms (e.g., wheelchair or bedside chair)?: A Little Help needed to walk in hospital room?: A Little Help needed climbing 3-5 steps with a railing? : A Lot 6 Click Score: 17    End of Session   Activity Tolerance: Patient tolerated treatment well Patient left: in chair;with call bell/phone within reach;with chair alarm set Nurse Communication: Mobility status PT Visit Diagnosis: Unsteadiness on feet (R26.81);Muscle weakness (generalized) (M62.81);History of falling  (Z91.81);Pain Pain - part of body:  (back)    Time: 1610-9604 PT Time Calculation (min) (ACUTE ONLY): 18 min   Charges:   PT Evaluation $PT Eval Low Complexity: 1 Low   PT General Charges $$ ACUTE PT VISIT: 1 Visit         10:38 AM, 12/06/22 Nnenna Meador Small [name] Caley MPT [place] [facility] [place] [phone] [phone]

## 2022-12-06 NOTE — Plan of Care (Signed)
  Problem: Acute Rehab PT Goals(only PT should resolve) Goal: Pt Will Go Supine/Side To Sit Outcome: Progressing Flowsheets (Taken 12/06/2022 1040) Pt will go Supine/Side to Sit: with supervision Goal: Patient Will Transfer Sit To/From Stand Outcome: Progressing Flowsheets (Taken 12/06/2022 1040) Patient will transfer sit to/from stand: with contact guard assist Goal: Pt Will Transfer Bed To Chair/Chair To Bed Outcome: Progressing Flowsheets (Taken 12/06/2022 1040) Pt will Transfer Bed to Chair/Chair to Bed: with min assist Goal: Pt Will Ambulate Outcome: Progressing Flowsheets (Taken 12/06/2022 1040) Pt will Ambulate:  50 feet  with rolling walker  with contact guard assist  with supervision

## 2022-12-06 NOTE — Progress Notes (Signed)
Pt is active with [facility]. [name] is aware of admission. TOC will follow.    12/06/22 1040  TOC Brief Assessment  Insurance and Status Reviewed  Patient has primary care physician Yes  Home environment has been reviewed Lives alone.  Prior level of function: Has assistance through hospice.  Prior/Current Home Services Current home services [facility])  Social Determinants of Health Reivew SDOH reviewed no interventions necessary  Readmission risk has been reviewed Yes  Transition of care needs no transition of care needs at this time

## 2022-12-06 NOTE — Assessment & Plan Note (Signed)
-   Potassium 2.8 - Replace and recheck

## 2022-12-06 NOTE — Evaluation (Signed)
Occupational Therapy Evaluation Patient Details Name: Joan [name] MRN: [id number] DOB: 08/23/1960 Today's Date: 12/06/2022   History of Present Illness Joan [name] is a 62 y.o. female with medical history significant of chronic respiratory failure on 3-1/2 L at baseline, COPD, cigarette smoker, generalized anxiety disorder, depression, GERD, hep C, hyperlipidemia, hypertension, hypothyroidism, and more presents the ED with a chief complaint of fall.  Patient reports she became dizzy and fell.  She had felt dizzy all day.  She reports she also felt confused and disoriented.  She thinks she did hit her head.  She denies taking any blood thinners.  She denies any asymmetrical numbness.  She does report left leg weakness at the time of her fall.  Strength seems equal on my exam.  Patient reports that her left leg weakness had been present for a week prior to her fall.  She reports she has had a decreased appetite.  She only eats 1 meal per day.  That is when Meals on Wheels comes.  She said this is because she does not like to cook.  She think she has been losing weight but she is not sure how much.  She has never noticed herself wheezing even though it is unclear if she is wheezing during exam, even from across the room.  She reports a cough that is productive of yellow sputum.  She has had no fevers.  She uses her scheduled inhaler as prescribed.  She reports using her rescue inhaler 3-4 times per day.  She has no hemoptysis.  She has no chest pain.  Patient does note swelling in her legs but is not sure when it started.  She has no other complaints at this time. (per DO)   Clinical Impression   Pt agreeable to OT and PT co-evaluation. Pt is assisted for lower body ADL's at baseline by care attendant. Today pt was able to transfer and ambulate with CGA to min A. Pt demonstrates general weakness with mild impulsivity. Pt was able to complete peri-care while seated on the toilet. Pt reports  interest in home health therapy services. Pt was left in the chair with chair alarm set and call bell within reach. Pt will benefit from continued OT in the hospital and recommended venue below to increase strength, balance, and endurance for safe ADL's.         If plan is discharge home, recommend the following: A little help with walking and/or transfers;A lot of help with bathing/dressing/bathroom;Assistance with cooking/housework;Assist for transportation    Functional Status Assessment  Patient has had a recent decline in their functional status and demonstrates the ability to make significant improvements in function in a reasonable and predictable amount of time.  Equipment Recommendations  None recommended by OT           Precautions / Restrictions Precautions Precautions: Fall Restrictions Weight Bearing Restrictions: No      Mobility Bed Mobility               General bed mobility comments: Pt sitting on toilet at start of session.    Transfers Overall transfer level: Needs assistance Equipment used: Rolling walker (2 wheels) Transfers: Bed to chair/wheelchair/BSC, Sit to/from Stand Sit to Stand: Contact guard assist, Min assist     Step pivot transfers: Contact guard assist, Min assist     General transfer comment: Min A to boost from toilet. More CGA once standing. Improved sit to stand from chair.  Balance Overall balance assessment: Needs assistance Sitting-balance support: No upper extremity supported, Feet supported Sitting balance-Leahy Scale: Good Sitting balance - Comments: seated at EOB   Standing balance support: Bilateral upper extremity supported, During functional activity, Reliant on assistive device for balance Standing balance-Leahy Scale: Fair Standing balance comment: using RW                           ADL either performed or assessed with clinical judgement   ADL Overall ADL's : Needs assistance/impaired      Grooming: Contact guard assist;Standing;Minimal assistance   Upper Body Bathing: Set up;Sitting           Lower Body Dressing: Moderate assistance;Maximal assistance;Sitting/lateral leans   Toilet Transfer: Contact guard assist;Minimal assistance;Ambulation Toilet Transfer Details (indicate cue type and reason): Toilet to chair with RW. Toileting- Clothing Manipulation and Hygiene: Set up;Contact guard assist;Sitting/lateral lean       Functional mobility during ADLs: Contact guard assist;Rolling walker (2 wheels)       Vision Baseline Vision/History: 1 Wears glasses Ability to See in Adequate Light: 1 Impaired Patient Visual Report: No change from baseline Vision Assessment?: No apparent visual deficits     Perception Perception: Not tested       Praxis Praxis: Not tested       Pertinent Vitals/Pain Pain Assessment Pain Assessment: Faces Faces Pain Scale: No hurt     Extremity/Trunk Assessment Upper Extremity Assessment Upper Extremity Assessment: Generalized weakness   Lower Extremity Assessment Lower Extremity Assessment: Defer to PT evaluation   Cervical / Trunk Assessment Cervical / Trunk Assessment: Kyphotic   Communication Communication Communication: No apparent difficulties   Cognition Arousal: Alert Behavior During Therapy: WFL for tasks assessed/performed Overall Cognitive Status: No family/caregiver present to determine baseline cognitive functioning                                                        Home Living Family/patient expects to be discharged to:: Private residence Living Arrangements: Alone Available Help at Discharge: Available PRN/intermittently;Personal care attendant Type of Home: Apartment Home Access: Ramped entrance     Home Layout: One level     Bathroom Shower/Tub: Producer, television/film/video: Standard Bathroom Accessibility: Yes How Accessible: Accessible via  wheelchair;Accessible via walker Home Equipment: Rollator (4 wheels);Cane - single point;Shower seat;Wheelchair - manual;BSC/3in1;Electric scooter          Prior Functioning/Environment Prior Level of Function : Needs assist       Physical Assist : ADLs (physical);Mobility (physical) Mobility (physical): Transfers;Gait ADLs (physical): Dressing;Bathing;IADLs Mobility Comments: w/c used within the community; RW used in the house. ADLs Comments: Aide assists 3X/wk with bathing/dressing. Pt requires assist for LB ADLs.        OT Problem List: Decreased strength;Decreased activity tolerance;Impaired balance (sitting and/or standing);Decreased safety awareness      OT Treatment/Interventions: Self-care/ADL training;Therapeutic exercise;Therapeutic activities;Patient/family education    OT Goals(Current goals can be found in the care plan section) Acute Rehab OT Goals Patient Stated Goal: return home OT Goal Formulation: With patient Time For Goal Achievement: 12/20/22 Potential to Achieve Goals: Good  OT Frequency: Min 2X/week    Co-evaluation PT/OT/SLP Co-Evaluation/Treatment: Yes Reason for Co-Treatment: To address functional/ADL transfers   OT goals addressed during session: ADL's and self-care  AM-PAC OT "6 Clicks" Daily Activity     Outcome Measure Help from another person eating meals?: None Help from another person taking care of personal grooming?: A Little Help from another person toileting, which includes using toliet, bedpan, or urinal?: A Little Help from another person bathing (including washing, rinsing, drying)?: A Lot Help from another person to put on and taking off regular upper body clothing?: A Little Help from another person to put on and taking off regular lower body clothing?: A Lot 6 Click Score: 17   End of Session Equipment Utilized During Treatment: Rolling walker (2 wheels) Nurse Communication: Other (comment) (notified pt was in the  chair.)  Activity Tolerance: Patient tolerated treatment well Patient left: in chair;with call bell/phone within reach;with chair alarm set  OT Visit Diagnosis: Unsteadiness on feet (R26.81);Other abnormalities of gait and mobility (R26.89);Muscle weakness (generalized) (M62.81);History of falling (Z91.81)                Time: 1610-9604 OT Time Calculation (min): 17 min Charges:  OT General Charges $OT Visit: 1 Visit OT Evaluation $OT Eval Low Complexity: 1 Low  Tereso Unangst OT, MOT   Danie Chandler 12/06/2022, 10:08 AM

## 2022-12-06 NOTE — Progress Notes (Signed)
ASSUMPTION OF CARE NOTE   12/06/2022 5:01 PM  Joan [name] was seen and examined.  The H&P by the admitting provider, orders, imaging was reviewed.  Please see new orders.  Will continue to follow.   Vitals:   12/06/22 1435 12/06/22 1511  BP:  122/75  Pulse:  75  Resp:  18  Temp:  97.9 F (36.6 C)  SpO2: 99% 98%    Results for orders placed or performed during the hospital encounter of 12/05/22  SARS Coronavirus 2 by RT PCR (hospital order, performed in [facility] hospital lab) *cepheid single result test* Anterior Nasal Swab   Specimen: Anterior Nasal Swab  Result Value Ref Range   SARS Coronavirus 2 by RT PCR NEGATIVE NEGATIVE  Basic metabolic panel  Result Value Ref Range   Sodium 128 (L) 135 - 145 mmol/L   Potassium 2.8 (L) 3.5 - 5.1 mmol/L   Chloride 86 (L) 98 - 111 mmol/L   CO2 33 (H) 22 - 32 mmol/L   Glucose, Bld 103 (H) 70 - 99 mg/dL   BUN 11 8 - 23 mg/dL   Creatinine, Ser 1.61 0.44 - 1.00 mg/dL   Calcium 9.0 8.9 - 09.6 mg/dL   GFR, Estimated >04 >54 mL/min   Anion gap 9 5 - 15  CBC with Differential  Result Value Ref Range   WBC 11.0 (H) 4.0 - 10.5 K/uL   RBC 3.95 3.87 - 5.11 MIL/uL   Hemoglobin 11.4 (L) 12.0 - 15.0 g/dL   HCT 09.8 (L) 11.9 - 14.7 %   MCV 89.4 80.0 - 100.0 fL   MCH 28.9 26.0 - 34.0 pg   MCHC 32.3 30.0 - 36.0 g/dL   RDW 82.9 56.2 - 13.0 %   Platelets 272 150 - 400 K/uL   nRBC 0.0 0.0 - 0.2 %   Neutrophils Relative % 65 %   Neutro Abs 7.2 1.7 - 7.7 K/uL   Lymphocytes Relative 23 %   Lymphs Abs 2.5 0.7 - 4.0 K/uL   Monocytes Relative 8 %   Monocytes Absolute 0.9 0.1 - 1.0 K/uL   Eosinophils Relative 3 %   Eosinophils Absolute 0.3 0.0 - 0.5 K/uL   Basophils Relative 1 %   Basophils Absolute 0.1 0.0 - 0.1 K/uL   Immature Granulocytes 0 %   Abs Immature Granulocytes 0.03 0.00 - 0.07 K/uL  Brain natriuretic peptide  Result Value Ref Range   B Natriuretic Peptide 55.0 0.0 - 100.0 pg/mL  Urinalysis, Routine w reflex microscopic  -Urine, Clean Catch  Result Value Ref Range   Color, Urine YELLOW YELLOW   APPearance CLEAR CLEAR   Specific Gravity, Urine 1.010 1.005 - 1.030   pH 7.0 5.0 - 8.0   Glucose, UA NEGATIVE NEGATIVE mg/dL   Hgb urine dipstick NEGATIVE NEGATIVE   Bilirubin Urine NEGATIVE NEGATIVE   Ketones, ur NEGATIVE NEGATIVE mg/dL   Protein, ur NEGATIVE NEGATIVE mg/dL   Nitrite NEGATIVE NEGATIVE   Leukocytes,Ua NEGATIVE NEGATIVE  Blood gas, venous  Result Value Ref Range   pH, Ven 7.39 7.25 - 7.43   pCO2, Ven 64 (H) 44 - 60 mmHg   pO2, Ven 50 (H) 32 - 45 mmHg   Bicarbonate 39.0 (H) 20.0 - 28.0 mmol/L   Acid-Base Excess 10.1 (H) 0.0 - 2.0 mmol/L   O2 Saturation 84.3 %   Patient temperature 36.4    Collection site BLOOD LEFT HAND    Drawn by [id number]   Magnesium  Result Value Ref  Range   Magnesium 1.8 1.7 - 2.4 mg/dL  Rapid urine drug screen (hospital performed)  Result Value Ref Range   Opiates POSITIVE (A) NONE DETECTED   Cocaine NONE DETECTED NONE DETECTED   Benzodiazepines POSITIVE (A) NONE DETECTED   Amphetamines NONE DETECTED NONE DETECTED   Tetrahydrocannabinol NONE DETECTED NONE DETECTED   Barbiturates NONE DETECTED NONE DETECTED  Comprehensive metabolic panel  Result Value Ref Range   Sodium 130 (L) 135 - 145 mmol/L   Potassium 3.8 3.5 - 5.1 mmol/L   Chloride 90 (L) 98 - 111 mmol/L   CO2 30 22 - 32 mmol/L   Glucose, Bld 256 (H) 70 - 99 mg/dL   BUN 10 8 - 23 mg/dL   Creatinine, Ser 5.28 0.44 - 1.00 mg/dL   Calcium 8.5 (L) 8.9 - 10.3 mg/dL   Total Protein 6.6 6.5 - 8.1 g/dL   Albumin 3.7 3.5 - 5.0 g/dL   AST 19 15 - 41 U/L   ALT 17 0 - 44 U/L   Alkaline Phosphatase 120 38 - 126 U/L   Total Bilirubin 0.4 0.3 - 1.2 mg/dL   GFR, Estimated >41 >32 mL/min   Anion gap 10 5 - 15  Magnesium  Result Value Ref Range   Magnesium 2.2 1.7 - 2.4 mg/dL  CBC with Differential/Platelet  Result Value Ref Range   WBC 11.4 (H) 4.0 - 10.5 K/uL   RBC 4.45 3.87 - 5.11 MIL/uL   Hemoglobin 12.8 12.0  - 15.0 g/dL   HCT 44.0 10.2 - 72.5 %   MCV 90.1 80.0 - 100.0 fL   MCH 28.8 26.0 - 34.0 pg   MCHC 31.9 30.0 - 36.0 g/dL   RDW 36.6 44.0 - 34.7 %   Platelets 271 150 - 400 K/uL   nRBC 0.0 0.0 - 0.2 %   Neutrophils Relative % 91 %   Neutro Abs 10.4 (H) 1.7 - 7.7 K/uL   Lymphocytes Relative 7 %   Lymphs Abs 0.8 0.7 - 4.0 K/uL   Monocytes Relative 1 %   Monocytes Absolute 0.1 0.1 - 1.0 K/uL   Eosinophils Relative 0 %   Eosinophils Absolute 0.0 0.0 - 0.5 K/uL   Basophils Relative 0 %   Basophils Absolute 0.0 0.0 - 0.1 K/uL   Immature Granulocytes 1 %   Abs Immature Granulocytes 0.06 0.00 - 0.07 K/uL  Glucose, capillary  Result Value Ref Range   Glucose-Capillary 344 (H) 70 - 99 mg/dL  Glucose, capillary  Result Value Ref Range   Glucose-Capillary 301 (H) 70 - 99 mg/dL  ECHOCARDIOGRAM COMPLETE  Result Value Ref Range   Weight 2,880 oz   Height 68 in   BP 122/75 mmHg   Area-P 1/2 2.62 cm2   S' Lateral 3.20 cm   MV M vel 4.49 m/s   MV Peak grad 80.6 mmHg   AR max vel 2.72 cm2   AV Area mean vel 2.91 cm2   AV Area VTI 2.91 cm2   Est EF 60 - 65%    AV Peak grad 9.2 mmHg   Ao pk vel 1.51 m/s   AV Mean grad 4.6 mmHg  Troponin I (High Sensitivity)  Result Value Ref Range   Troponin I (High Sensitivity) 3 <18 ng/L  Troponin I (High Sensitivity)  Result Value Ref Range   Troponin I (High Sensitivity) 3 <18 ng/L   *Note: Due to a large number of results and/or encounters for the requested time period, some results have not  been displayed. A complete set of results can be found in Results Review.   Maryln Manuel, MD [facility]   12/05/2022  7:40 PM How to contact the [facility] Attending or Consulting provider 7A - 7P or covering provider during after hours 7P -7A, for this patient?  Check the care team in [facility] and look for a) attending/consulting [facility] provider listed and b) the [facility] team listed Log into [email or web address] and use [place]'s universal password to access. If you do not  have the password, please contact the hospital operator. Locate the [facility] provider you are looking for under [facility] and page to a number that you can be directly reached. If you still have difficulty reaching the provider, please page the [facility] (Director on Call) for the Hospitalists listed on amion for assistance.

## 2022-12-06 NOTE — Assessment & Plan Note (Signed)
Continue Synthroid °

## 2022-12-06 NOTE — Assessment & Plan Note (Signed)
- 

## 2022-12-06 NOTE — Assessment & Plan Note (Signed)
-   Patient was very somnolent on presentation to the floor - At the time of my exam she is asking for her Valium, Seroquel, trazodone, Flexeril, Lyrica - I have educated the patient on polypharmacy and slowly restarting her meds to see which ones are causing her the problem - Patient is less than agreeable to having her medications held - CT head and C-spine showed no acute findings - Will continue to monitor

## 2022-12-06 NOTE — Assessment & Plan Note (Signed)
-   Secondary to poor p.o. intake - Patient reports she eats 1 meal a day and that is when meal is on wheels come - Currently has normal saline running with potassium at bedside - Trend in the a.m.

## 2022-12-06 NOTE — Assessment & Plan Note (Signed)
-   Holding Valium at this time - Continue flexor and Wellbutrin

## 2022-12-06 NOTE — Assessment & Plan Note (Signed)
-   With wheezing bilaterally - Chronic retainer per VBG - Chest x-ray shows cardiomegaly and possibly pulmonary vascular congestion - Continue scheduled DuoNeb - As needed albuterol - Continue steroids - Continue chronic oxygen supplementation - Check VBG for any changes in mental status - Continue to monitor

## 2022-12-06 NOTE — Assessment & Plan Note (Signed)
-   Patient became dizzy and fell - She thinks she did hit her head - She is not on blood thinners - CT head and C-spine showed no acute changes - CT lumbar spine shows no acute fracture or traumatic listhesis - PT eval and treat -Hold high risk medications - Continue to monitor

## 2022-12-06 NOTE — Assessment & Plan Note (Signed)
-   Reports smoking less than a pack per day - Counseled on the importance of cessation especially for a patient who is requiring oxygen supplementation - Patient reports she is trying to quit

## 2022-12-06 NOTE — Assessment & Plan Note (Signed)
Continue Norvasc and atenolol.

## 2022-12-06 NOTE — Progress Notes (Signed)
  Echocardiogram 2D Echocardiogram has been performed.  Milda Smart 12/06/2022, 4:17 PM

## 2022-12-06 NOTE — Assessment & Plan Note (Signed)
Continue Effexor and Wellbutrin.

## 2022-12-06 NOTE — Plan of Care (Signed)
  Problem: Acute Rehab OT Goals (only OT should resolve) Goal: Pt. Will Perform Grooming Flowsheets (Taken 12/06/2022 1011) Pt Will Perform Grooming:  with modified independence  standing Goal: Pt. Will Perform Lower Body Dressing Flowsheets (Taken 12/06/2022 1011) Pt Will Perform Lower Body Dressing:  with min assist  sitting/lateral leans Goal: Pt. Will Transfer To Toilet Flowsheets (Taken 12/06/2022 1011) Pt Will Transfer to Toilet:  with modified independence  ambulating Goal: Pt. Will Perform Toileting-Clothing Manipulation Flowsheets (Taken 12/06/2022 1011) Pt Will Perform Toileting - Clothing Manipulation and hygiene:  with modified independence  sit to/from stand  sitting/lateral leans Goal: Pt/Caregiver Will Perform Home Exercise Program Flowsheets (Taken 12/06/2022 1011) Pt/caregiver will Perform Home Exercise Program:  Increased strength  Both right and left upper extremity  Independently  Canuto Kingston OT, MOT

## 2022-12-06 NOTE — Assessment & Plan Note (Signed)
-   Patient is taking Valium, Seroquel, trazodone, Lyrica, Flexeril per her PTA medication list - This is contributing to her encephalopathy when she presented to the hospital and also contributing to her fall - Holding Valium, Seroquel, trazodone, Lyrica, Flexeril at this time - Plan would be to start adding them back slowly to see if any of these medications are causing her current problems - Continue to monitor

## 2022-12-06 NOTE — H&P (Signed)
History and Physical    Patient: Joan [name] DGL:[id number] DOB: 1960-07-08 DOA: 12/05/2022 DOS: the patient was seen and examined on 12/06/2022 PCP: Myrlene Broker, MD  Patient coming from: Home  Chief Complaint:  Chief Complaint  Patient presents with   Fall   HPI: Joan [name] is a 62 y.o. female with medical history significant of chronic respiratory failure on 3-1/2 L at baseline, COPD, cigarette smoker, generalized anxiety disorder, depression, GERD, hep C, hyperlipidemia, hypertension, hypothyroidism, and more presents the ED with a chief complaint of fall.  Patient reports she became dizzy and fell.  She had felt dizzy all day.  She reports she also felt confused and disoriented.  She thinks she did hit her head.  She denies taking any blood thinners.  She denies any asymmetrical numbness.  She does report left leg weakness at the time of her fall.  Strength seems equal on my exam.  Patient reports that her left leg weakness had been present for a week prior to her fall.  She reports she has had a decreased appetite.  She only eats 1 meal per day.  That is when Meals on Wheels comes.  She said this is because she does not like to cook.  She think she has been losing weight but she is not sure how much.  She has never noticed herself wheezing even though it is unclear if she is wheezing during exam, even from across the room.  She reports a cough that is productive of yellow sputum.  She has had no fevers.  She uses her scheduled inhaler as prescribed.  She reports using her rescue inhaler 3-4 times per day.  She has no hemoptysis.  She has no chest pain.  Patient does note swelling in her legs but is not sure when it started.  She has no other complaints at this time.  Patient is a current smoker.  She does not drink.  Patient is full code. Review of Systems: As mentioned in the history of present illness. All other systems reviewed and are negative. Past Medical  History:  Diagnosis Date   Acute respiratory failure with hypoxia (HCC)    Alcoholism and drug addiction in family    Anxiety    Appendicitis    Arthritis    "knees, hips, lower back" (09/25/2016)   Asthma    Bipolar disorder (HCC)    Chronic bronchitis (HCC)    "all the time" (09/25/2016)   Chronic leg pain    RLE   Chronic lower back pain    COPD (chronic obstructive pulmonary disease) (HCC)    Depression    Diabetes mellitus without complication (HCC)    Diabetic neuropathy (HCC)    Early cataracts, bilateral    Emphysema of lung (HCC)    Generalized anxiety disorder 02/09/2020   GERD (gastroesophageal reflux disease)    Headache    Hepatitis C    "treated back in 2001-2002"   High cholesterol    Hypertension    Hypothyroidism    Incarcerated ventral hernia 04/04/2017   Mood disorder in conditions classified elsewhere 02/09/2020   On home oxygen therapy    "2L; at nighttime" (04/04/2017)   OSA on CPAP    CPAP with Oxygen 3 liters   Paranoid (HCC)    Pneumonia    "all the time" (09/25/2016)   Positive PPD    with negative chest x ray   PTSD (post-traumatic stress disorder) 02/09/2020   Tachycardia  patient reports with exertion ; denies any other conditions    Thyroid disease    Wears glasses    Wears partial dentures    Past Surgical History:  Procedure Laterality Date   ANTERIOR CERVICAL DECOMP/DISCECTOMY FUSION N/A 02/14/2022   Procedure: Anterior Cervical Decompression/Discectomy Fusion Cervical Three-Cervical Four;  Surgeon: Bedelia Person, MD;  Location: [facility] OR;  Service: Neurosurgery;  Laterality: N/A;  3C   APPENDECTOMY     BACK SURGERY     BIOPSY  11/24/2018   Procedure: BIOPSY;  Surgeon: Tressia Danas, MD;  Location: [facility];  Service: Gastroenterology;;   CARDIAC CATHETERIZATION     COLONOSCOPY     COLONOSCOPY WITH PROPOFOL N/A 11/24/2018   Procedure: COLONOSCOPY WITH PROPOFOL;  Surgeon: Tressia Danas, MD;  Location: [facility];   Service: Gastroenterology;  Laterality: N/A;   CYST EXCISION     removal of cyst in sinuses   DILATION AND CURETTAGE OF UTERUS     ESOPHAGOGASTRODUODENOSCOPY (EGD) WITH PROPOFOL N/A 11/24/2018   Procedure: ESOPHAGOGASTRODUODENOSCOPY (EGD) WITH PROPOFOL;  Surgeon: Tressia Danas, MD;  Location: [facility];  Service: Gastroenterology;  Laterality: N/A;   FOOT SURGERY Bilateral    bone removed from 4th and 5 th toes and put in pin then took pin out   HERNIA REPAIR     INCISION AND DRAINAGE  ~ 2014/2015   "removed mesh & infection"   INCISIONAL HERNIA REPAIR N/A 04/04/2017   Procedure: LAPAROSCOPIC INCISIONAL HERNIA REPAIR WITH MESH;  Surgeon: Claud Kelp, MD;  Location: [facility] OR;  Service: General;  Laterality: N/A;   LACERATION REPAIR Right    repair wrist artery from laceration from ice skate   LAPAROSCOPIC APPENDECTOMY N/A 03/31/2019   Procedure: APPENDECTOMY LAPAROSCOPIC;  Surgeon: Axel Filler, MD;  Location: [facility];  Service: General;  Laterality: N/A;   LAPAROSCOPIC APPENDECTOMY N/A 07/09/2019   Procedure: APPENDECTOMY LAPAROSCOPIC;  Surgeon: Axel Filler, MD;  Location: [facility] OR;  Service: General;  Laterality: N/A;   LAPAROSCOPIC INCISIONAL / UMBILICAL / VENTRAL HERNIA REPAIR  04/04/2017   LAPAROSCOPIC INCISIONAL HERNIA REPAIR WITH MESH   LIPOMA EXCISION Right    fattty lipoma in neck   NASAL SEPTUM SURGERY     POSTERIOR LUMBAR FUSION  2015/2016 X 2   "added rods and screws"   SINOSCOPY     TOTAL HIP ARTHROPLASTY Right 02/05/2017   Procedure: RIGHT TOTAL HIP ARTHROPLASTY;  Surgeon: Ranee Gosselin, MD;  Location: [facility];  Service: Orthopedics;  Laterality: Right;   TOTAL HIP ARTHROPLASTY Left 06/18/2017   Procedure: LEFT TOTAL HIP ARTHROPLASTY;  Surgeon: Ranee Gosselin, MD;  Location: [facility];  Service: Orthopedics;  Laterality: Left;   TOTAL HIP REVISION Left 01/26/2019   Procedure: TOTAL HIP REVISION;  Surgeon: Durene Romans, MD;  Location: [facility];  Service:  Orthopedics;  Laterality: Left;  2 hrs   TUBAL LIGATION     UMBILICAL HERNIA REPAIR  ~ 2013   w/mesh   [facility] ENDOSCOPY     Social History:  reports that she has been smoking cigarettes. She started smoking about 47 years ago. She has a 95.6 pack-year smoking history. She has been exposed to tobacco smoke. She has never used smokeless tobacco. She reports that she does not currently use alcohol. She reports that she does not currently use drugs after having used the following drugs: "Crack" cocaine.  Allergies  Allergen Reactions   Keflex [Cephalexin] Rash and Other (See Comments)    Has tolerated amoxicillin since had keflex  Shellfish Allergy Shortness Of Breath, Nausea And Vomiting and Other (See Comments)    Stomach cramps   Tetracyclines & Related Anaphylaxis, Swelling and Other (See Comments)    Throat swelling requiring hospitalization   Dilaudid [Hydromorphone Hcl] Other (See Comments)    "Lethargy"   Prednisone Other (See Comments)    "counter reacts", has tolerated IV medrol   Clindamycin/Lincomycin Other (See Comments)    Burning sensation in her mouth down her throat    Incruse Ellipta [Umeclidinium Bromide] Nausea Only   Molds & Smuts Itching   Tuberculin Tests Other (See Comments)    False postive   Umeclidinium     Other Reaction(s): GI Intolerance    Family History  Problem Relation Age of Onset   Diabetes Mother    Hypertension Mother    Hypertension Father    Cancer Father        lung   Breast cancer Neg Hx     Prior to Admission medications   Medication Sig Start Date End Date Taking? Authorizing Provider  amLODipine (NORVASC) 5 MG tablet Take 1 tablet (5 mg total) by mouth daily. 10/11/22 01/09/23 Yes Narda Bonds, MD  ascorbic acid (VITAMIN C) 500 MG tablet Take 500 mg by mouth 2 (two) times daily.   Yes [provider]  atenolol (TENORMIN) 25 MG tablet TAKE 1 TABLET BY MOUTH DAILY 09/18/22  Yes Myrlene Broker, MD  atorvastatin  (LIPITOR) 20 MG tablet TAKE 1 TABLET BY MOUTH ONCE  DAILY 07/23/22  Yes Myrlene Broker, MD  buPROPion (WELLBUTRIN XL) 300 MG 24 hr tablet TAKE 1 TABLET BY MOUTH DAILY 06/04/22  Yes Thresa Ross, MD  cyclobenzaprine (FLEXERIL) 10 MG tablet TAKE 1 TABLET BY MOUTH AT  BEDTIME 07/31/22  Yes Myrlene Broker, MD  diazepam (VALIUM) 5 MG tablet Take 5-10 mg by mouth every 6 (six) hours as needed. 09/18/22  Yes [provider]  diphenhydrAMINE (BENADRYL) 25 mg capsule Take 25 mg by mouth 2 (two) times daily.   Yes [provider]  fluticasone (FLONASE) 50 MCG/ACT nasal spray Place 2 sprays into both nostrils daily. Patient taking differently: Place 2 sprays into both nostrils daily as needed for allergies. 09/05/21 12/05/22 Yes Alfonse Spruce, MD  guaiFENesin-Codeine 225-7.5 MG/5ML LIQD Take 5 mLs by mouth 3 (three) times daily as needed. 07/30/22  Yes Oretha Milch, MD  Ipratropium-Albuterol (COMBIVENT RESPIMAT) 20-100 MCG/ACT AERS respimat Inhale 1 puff into the lungs every 6 (six) hours as needed for wheezing or shortness of breath. 04/02/22  Yes Bevelyn Ngo, NP  levothyroxine (SYNTHROID) 25 MCG tablet TAKE 1 TABLET BY MOUTH DAILY  BEFORE BREAKFAST Patient taking differently: Take 25 mcg by mouth daily before breakfast. 07/23/22  Yes Myrlene Broker, MD  MELATONIN PO Take 5 mg by mouth at bedtime as needed (sleep).   Yes [provider]  montelukast (SINGULAIR) 10 MG tablet TAKE 1 TABLET BY MOUTH AT  BEDTIME Patient taking differently: Take 10 mg by mouth at bedtime. 07/23/22  Yes Oretha Milch, MD  naproxen (NAPROSYN) 500 MG tablet Take 1 tablet (500 mg total) by mouth 2 (two) times daily with a meal. Patient taking differently: Take 500 mg by mouth daily as needed for mild pain or headache. 03/29/22  Yes Myrlene Broker, MD  NUTRITIONAL SUPPLEMENTS PO Take 330 mLs by mouth in the morning and at bedtime.   Yes [provider]   nystatin-triamcinolone ointment (MYCOLOG) Apply 1 application topically  2 (two) times daily as needed (applied to skin folds/groins/under breast skin irritation rash.). 09/08/17  Yes Myrlene Broker, MD  OXYGEN Inhale 3 L/min into the lungs See admin instructions. Inhale  3 L/min of oxygen into the lungs w/CPAP at bedtime and as needed for shortness of breath with "strenuous activity" during the day   Yes [provider]  pantoprazole (PROTONIX) 40 MG tablet TAKE 1 TABLET BY MOUTH DAILY Patient taking differently: Take 40 mg by mouth daily as needed (For reflux per patient). 12/21/21  Yes Myrlene Broker, MD  polyethylene glycol (MIRALAX / GLYCOLAX) 17 g packet Take 17 g by mouth daily as needed for mild constipation. 04/14/19  Yes Meuth, Brooke A, PA-C  pregabalin (LYRICA) 300 MG capsule TAKE 1 CAPSULE BY MOUTH TWICE  DAILY 11/25/22  Yes Myrlene Broker, MD  QUEtiapine (SEROQUEL) 100 MG tablet TAKE 1 TABLET BY MOUTH AT  BEDTIME 07/31/22  Yes Thresa Ross, MD  roflumilast (DALIRESP) 500 MCG TABS tablet Take 1 tablet (500 mcg total) by mouth daily. 04/02/22  Yes Bevelyn Ngo, NP  silver sulfADIAZINE (SILVADENE) 1 % cream Apply 1 Application topically daily. 10/09/22  Yes Felecia Shelling, DPM  traZODone (DESYREL) 100 MG tablet TAKE 1 TABLET BY MOUTH AT  BEDTIME Patient taking differently: Take 100 mg by mouth at bedtime. 12/21/21  Yes Myrlene Broker, MD  triamcinolone cream (KENALOG) 0.1 % Apply 1 Application topically 2 (two) times daily. Patient taking differently: Apply 1 Application topically 2 (two) times daily as needed (for rash). 08/20/22  Yes Myrlene Broker, MD  venlafaxine XR (EFFEXOR-XR) 150 MG 24 hr capsule TAKE 1 CAPSULE BY MOUTH DAILY  WITH BREAKFAST Patient taking differently: Take 150 mg by mouth daily with breakfast. 12/21/21  Yes Myrlene Broker, MD  witch hazel-glycerin (TUCKS) pad Apply topically as needed for itching. Patient taking  differently: Apply 1 Application topically daily as needed for itching. 08/18/20  Yes Standley Brooking, MD  ZINC SULFATE PO Take 220 mg by mouth daily.   Yes [provider]  Accu-Chek Softclix Lancets lancets USE TO CHECK BLOOD SUGAR IN THE  MORNING, AT NOON, AND AT BEDTIME 11/15/22   Myrlene Broker, MD  Blood Glucose Monitoring Suppl DEVI 1 each by Does not apply route in the morning, at noon, and at bedtime. May substitute to any manufacturer covered by patient's insurance. 08/28/22   Myrlene Broker, MD  PATADAY 0.2 % SOLN Place 1 drop into both eyes daily. 08/20/19   [provider]    Physical Exam: Vitals:   12/05/22 2345 12/05/22 2359 12/06/22 0035 12/06/22 0220  BP: 116/70  127/79   Pulse: (!) 59  62   Resp: 12  18   Temp:  97.7 F (36.5 C) 97.6 F (36.4 C)   TempSrc:  Oral Axillary   SpO2: 99%  99% 96%  Weight:      Height:       1.  General: Patient lying supine in bed,  no acute distress   2. Psychiatric: Alert and oriented x 3, mood and behavior normal for situation, pleasant and cooperative with exam   3. Neurologic: Speech and language are normal, face is symmetric, moves all 4 extremities voluntarily, at baseline without acute deficits on limited exam   4. HEENMT:  Head is atraumatic, normocephalic, pupils reactive to light, neck is supple, trachea is midline, mucous membranes are moist   5. Respiratory : Wheezing throughout without rhonchi,  rales, no cyanosis, no increase in work of breathing or accessory muscle use   6. Cardiovascular : Heart rate normal, rhythm is regular, no murmurs, rubs or gallops, peripheral edema present, peripheral pulses palpated   7. Gastrointestinal:  Abdomen is soft, nondistended, nontender to palpation bowel sounds active, no masses or organomegaly palpated   8. Skin:  Skin is warm, dry and intact without rashes, acute lesions, or ulcers on limited exam   9.Musculoskeletal:  No acute deformities  or trauma, no asymmetry in tone, peripheral edema present, peripheral pulses palpated, no tenderness to palpation in the extremities  Data Reviewed: In the ED Temp is 97.6, heart rate 59-65, respiratory rate 12-18, blood pressure 113/68-136/112, maintaining oxygen sats on baseline nasal cannula Slight leukocytosis at 11.0, hemoglobin stable 11.4 Chemistry reveals electrolyte derangements including hyponatremia, hypokalemia, hypochloremia Trope 3 COVID-negative UA is not indicative of UTI CT C-spine and CT head showed no acute findings CT lumbar spine shows no acute fracture or traumatic listhesis Chest x-ray shows cardiomegaly with some pulmonary vascular congestion Patient was given mag sulfate, potassium, Solu-Medrol Admission requested for what was suspected to be a COPD exacerbation given patient's history but very well may be related to heart failure Assessment and Plan: Chronic respiratory failure with hypoxia (HCC) - On 3-1/2 L nasal cannula at home - Counseled on importance of cessation from smoking while on oxygen - Chest x-ray did show some pulmonary vascular congestion - Patient denies history of CHF - She does have some peripheral edema as well - 1 dose of Lasix ordered - Continue to monitor  Hypokalemia - Potassium 2.8 - Replace and recheck  Essential hypertension - Continue Norvasc and atenolol  Fall at home, initial encounter - Patient became dizzy and fell - She thinks she did hit her head - She is not on blood thinners - CT head and C-spine showed no acute changes - CT lumbar spine shows no acute fracture or traumatic listhesis - PT eval and treat -Hold high risk medications - Continue to monitor   Polypharmacy - Patient is taking Valium, Seroquel, trazodone, Lyrica, Flexeril per her PTA medication list - This is contributing to her encephalopathy when she presented to the hospital and also contributing to her fall - Holding Valium, Seroquel, trazodone,  Lyrica, Flexeril at this time - Plan would be to start adding them back slowly to see if any of these medications are causing her current problems - Continue to monitor  Hyponatremia - Secondary to poor p.o. intake - Patient reports she eats 1 meal a day and that is when meal is on wheels come - Currently has normal saline running with potassium at bedside - Trend in the a.m.  Acute metabolic encephalopathy - Patient was very somnolent on presentation to the floor - At the time of my exam she is asking for her Valium, Seroquel, trazodone, Flexeril, Lyrica - I have educated the patient on polypharmacy and slowly restarting her meds to see which ones are causing her the problem - Patient is less than agreeable to having her medications held - CT head and C-spine showed no acute findings - Will continue to monitor  COPD exacerbation (HCC) - With wheezing bilaterally - Chronic retainer per VBG - Chest x-ray shows cardiomegaly and possibly pulmonary vascular congestion - Continue scheduled DuoNeb - As needed albuterol - Continue steroids - Continue chronic oxygen supplementation - Check VBG for any changes in mental status - Continue to monitor  Acquired hypothyroidism Continue Synthroid  Depression -  Continue Effexor and Wellbutrin  Generalized anxiety disorder - Holding Valium at this time - Continue flexor and Wellbutrin  GERD (gastroesophageal reflux disease) - Continue Protonix  Tobacco use disorder - Reports smoking less than a pack per day - Counseled on the importance of cessation especially for a patient who is requiring oxygen supplementation - Patient reports she is trying to quit      Advance Care Planning:   Code Status: Full Code at  Consults: None at this time  Family Communication: No family at bedside  Severity of Illness: The appropriate patient status for this patient is OBSERVATION. Observation status is judged to be reasonable and necessary in  order to provide the required intensity of service to ensure the patient's safety. The patient's presenting symptoms, physical exam findings, and initial radiographic and laboratory data in the context of their medical condition is felt to place them at decreased risk for further clinical deterioration. Furthermore, it is anticipated that the patient will be medically stable for discharge from the hospital within 2 midnights of admission.   Author: Lilyan Gilford, DO 12/06/2022 4:45 AM  For on call review [email or web address].

## 2022-12-06 NOTE — Assessment & Plan Note (Signed)
-   On 3-1/2 L nasal cannula at home - Counseled on importance of cessation from smoking while on oxygen - Chest x-ray did show some pulmonary vascular congestion - Patient denies history of CHF - She does have some peripheral edema as well - 1 dose of Lasix ordered - Continue to monitor

## 2022-12-07 DIAGNOSIS — G9340 Encephalopathy, unspecified: Secondary | ICD-10-CM | POA: Diagnosis not present

## 2022-12-07 DIAGNOSIS — Z79899 Other long term (current) drug therapy: Secondary | ICD-10-CM | POA: Diagnosis not present

## 2022-12-07 DIAGNOSIS — F17200 Nicotine dependence, unspecified, uncomplicated: Secondary | ICD-10-CM | POA: Diagnosis not present

## 2022-12-07 DIAGNOSIS — J441 Chronic obstructive pulmonary disease with (acute) exacerbation: Secondary | ICD-10-CM | POA: Diagnosis not present

## 2022-12-07 LAB — GLUCOSE, CAPILLARY
Glucose-Capillary: 208 mg/dL — ABNORMAL HIGH (ref 70–99)
Glucose-Capillary: 294 mg/dL — ABNORMAL HIGH (ref 70–99)

## 2022-12-07 MED ORDER — INSULIN ASPART 100 UNIT/ML IJ SOLN
12.0000 [IU] | Freq: Three times a day (TID) | INTRAMUSCULAR | Status: DC
  Administered 2022-12-07: 12 [IU] via SUBCUTANEOUS

## 2022-12-07 MED ORDER — FLUTICASONE PROPIONATE 50 MCG/ACT NA SUSP: 2.0000 | Freq: Every day | NASAL | Status: DC | PRN

## 2022-12-07 MED ORDER — DIAZEPAM 5 MG PO TABS: 5.0000 mg | ORAL_TABLET | Freq: Four times a day (QID) | ORAL | Status: AC | PRN

## 2022-12-07 NOTE — Discharge Summary (Addendum)
guaiFENesin-Codeine 225-7.5 MG/5ML Liqd Take 5 mLs by mouth 3 (three) times daily as needed.   levothyroxine 25 MCG tablet Commonly known as: SYNTHROID TAKE 1 TABLET BY MOUTH DAILY  BEFORE BREAKFAST   MELATONIN PO Take 5 mg by mouth at bedtime as needed (sleep).   montelukast 10 MG tablet Commonly known as: SINGULAIR TAKE 1 TABLET BY MOUTH AT  BEDTIME   naproxen 500 MG tablet Commonly known as: NAPROSYN Take 1 tablet (500 mg total) by mouth 2 (two) times daily with a meal. What changed:  when to take this reasons to take this   OXYGEN Inhale 3 L/min into the lungs See admin instructions. Inhale  3 L/min of oxygen into the lungs w/CPAP at bedtime and as needed for shortness of breath with "strenuous activity" during the day   pantoprazole 40 MG tablet Commonly known as: PROTONIX TAKE 1 TABLET BY MOUTH DAILY What changed:  when to take this reasons to take this   pregabalin 300 MG capsule Commonly known as: LYRICA TAKE 1 CAPSULE BY MOUTH TWICE  DAILY   QUEtiapine 100 MG tablet Commonly known as: SEROQUEL TAKE 1 TABLET BY MOUTH AT  BEDTIME   roflumilast 500 MCG Tabs tablet Commonly known as: DALIRESP Take 1 tablet (500 mcg total) by mouth daily.   silver sulfADIAZINE 1 % cream Commonly known as: Silvadene Apply 1 Application topically daily.   venlafaxine XR 150 MG 24 hr capsule Commonly known as: EFFEXOR-XR TAKE 1 CAPSULE BY MOUTH DAILY  WITH BREAKFAST   witch hazel-glycerin pad Commonly known as: TUCKS Apply topically as needed for itching. What changed:  how much to take when to take this        Procedures/Studies: MR BRAIN WO CONTRAST  Result Date: 12/06/2022 CLINICAL DATA:  Neuro  deficit, acute, stroke suspected EXAM: MRI HEAD WITHOUT CONTRAST TECHNIQUE: Multiplanar, multiecho pulse sequences of the brain and surrounding structures were obtained without intravenous contrast. COMPARISON:  CT head 12/05/2022. FINDINGS: Brain: No acute infarction, acute hemorrhage, hydrocephalus, extra-axial collection or mass lesion. Artifact limits assessment the posterior fossa on diffusion-weighted and set to weighted imaging. Vascular: Major arterial flow voids are maintained at the skull base. Skull and upper cervical spine: Normal marrow signal. Sinuses/Orbits: Clear sinuses.  No acute orbital findings. Other: No mastoid effusion. IMPRESSION: No evidence of acute intracranial abnormality. Electronically Signed   By: Feliberto Harts M.D.   On: 12/06/2022 17:37   ECHOCARDIOGRAM COMPLETE  Result Date: 12/06/2022    ECHOCARDIOGRAM REPORT   Patient Name:   Joan [name] Date of Exam: 12/06/2022 Medical Rec #:  [id number]           Height:       68.0 in Accession #:    [id number]          Weight:       180.0 lb Date of Birth:  08-Nov-1960            BSA:          1.954 m Patient Age:    62 years            BP:           122/75 mmHg Patient Gender: F                   HR:           81 bpm. Exam Location:  [facility] Procedure: 2D Echo, Cardiac Doppler and Color Doppler Indications:    CHF  Physician Discharge Summary  Asmaa Tirpak ZOX:[id number] DOB: 06-15-1960 DOA: 12/05/2022  PCP: Myrlene Broker, MD Pulmonologist: Dr. Vassie Loll  Admit date: 12/05/2022 Discharge date: 12/07/2022  Admitted From:  HOME HOSPICE  Disposition: HOME HOSPICE   Recommendations for Outpatient Follow-up:  Resume home hospice services Follow up with PCP and pulmonologist  Discharge Condition: HOSPICE    Brief Hospitalization Summary: Please see all hospital notes, images, labs for full details of the hospitalization. Admission Provider HPI:  62 y.o. female with medical history significant of chronic respiratory failure on 3-1/2 L at baseline, COPD, cigarette smoker, generalized anxiety disorder, depression, GERD, hep C, hyperlipidemia, hypertension, hypothyroidism, and more presents the ED with a chief complaint of fall.  Patient reports she became dizzy and fell.  She had felt dizzy all day.  She reports she also felt confused and disoriented.  She thinks she did hit her head.  She denies taking any blood thinners.  She denies any asymmetrical numbness.  She does report left leg weakness at the time of her fall.  Strength seems equal on my exam.  Patient reports that her left leg weakness had been present for a week prior to her fall.  She reports she has had a decreased appetite.  She only eats 1 meal per day.  That is when [employer] Whe[employer] comes.  She said this is because she does not like to cook.  She think she has been losing weight but she is not sure how much.  She has never noticed herself wheezing even though it is unclear if she is wheezing during exam, even from across the room.  She reports a cough that is productive of yellow sputum.  She has had no fevers.  She uses her scheduled inhaler as prescribed.  She reports using her rescue inhaler 3-4 times per day.  She has no hemoptysis.  She has no chest pain.  Patient does note swelling in her legs but is not sure when it started.  She has no  other complaints at this time.   Patient is a current smoker.  She does not drink.  Patient is full code.  Hospital Course  This patient was admitted to the hospital after a fall at home and symptoms concerning for acute COPD exacerbation.  There was concern about polypharmacy because she is on multiple sedating medications which we have worked to reduce.  She was encephalopathic on arrival.  Her acute metabolic encephalopathy was secondary to polypharmacy.  We have made some adjustments to her sedating medications to try and reduce the sedating medications.  She is on home hospice care.  She has chronic hypoxic respiratory failures and is on home hospice and followed by a pulmonologist.  She was treated with IV steroids and bronchodilators.  She was also evaluated by PT.  They recommended home health services if it could be arranged with her hospice benefits.  She is breathing better now and back to her baseline supplemental oxygen.  She is eager to return home to hospice services.  She is being discharged in stable condition.  Recommend outpatient follow-up as scheduled.  Discharge Diagnoses:  Principal Problem:   Acute encephalopathy Active Problems:   Tobacco use disorder   Essential hypertension   Chronic respiratory failure with hypoxia (HCC)   GERD (gastroesophageal reflux disease)   Generalized anxiety disorder   Depression   Acquired hypothyroidism   Hypokalemia   COPD exacerbation (HCC)   Acute metabolic encephalopathy   Hyponatremia  Physician Discharge Summary  Asmaa Tirpak ZOX:[id number] DOB: 06-15-1960 DOA: 12/05/2022  PCP: Myrlene Broker, MD Pulmonologist: Dr. Vassie Loll  Admit date: 12/05/2022 Discharge date: 12/07/2022  Admitted From:  HOME HOSPICE  Disposition: HOME HOSPICE   Recommendations for Outpatient Follow-up:  Resume home hospice services Follow up with PCP and pulmonologist  Discharge Condition: HOSPICE    Brief Hospitalization Summary: Please see all hospital notes, images, labs for full details of the hospitalization. Admission Provider HPI:  62 y.o. female with medical history significant of chronic respiratory failure on 3-1/2 L at baseline, COPD, cigarette smoker, generalized anxiety disorder, depression, GERD, hep C, hyperlipidemia, hypertension, hypothyroidism, and more presents the ED with a chief complaint of fall.  Patient reports she became dizzy and fell.  She had felt dizzy all day.  She reports she also felt confused and disoriented.  She thinks she did hit her head.  She denies taking any blood thinners.  She denies any asymmetrical numbness.  She does report left leg weakness at the time of her fall.  Strength seems equal on my exam.  Patient reports that her left leg weakness had been present for a week prior to her fall.  She reports she has had a decreased appetite.  She only eats 1 meal per day.  That is when [employer] comes.  She said this is because she does not like to cook.  She think she has been losing weight but she is not sure how much.  She has never noticed herself wheezing even though it is unclear if she is wheezing during exam, even from across the room.  She reports a cough that is productive of yellow sputum.  She has had no fevers.  She uses her scheduled inhaler as prescribed.  She reports using her rescue inhaler 3-4 times per day.  She has no hemoptysis.  She has no chest pain.  Patient does note swelling in her legs but is not sure when it started.  She has no  other complaints at this time.   Patient is a current smoker.  She does not drink.  Patient is full code.  Hospital Course  This patient was admitted to the hospital after a fall at home and symptoms concerning for acute COPD exacerbation.  There was concern about polypharmacy because she is on multiple sedating medications which we have worked to reduce.  She was encephalopathic on arrival.  Her acute metabolic encephalopathy was secondary to polypharmacy.  We have made some adjustments to her sedating medications to try and reduce the sedating medications.  She is on home hospice care.  She has chronic hypoxic respiratory failures and is on home hospice and followed by a pulmonologist.  She was treated with IV steroids and bronchodilators.  She was also evaluated by PT.  They recommended home health services if it could be arranged with her hospice benefits.  She is breathing better now and back to her baseline supplemental oxygen.  She is eager to return home to hospice services.  She is being discharged in stable condition.  Recommend outpatient follow-up as scheduled.  Discharge Diagnoses:  Principal Problem:   Acute encephalopathy Active Problems:   Tobacco use disorder   Essential hypertension   Chronic respiratory failure with hypoxia (HCC)   GERD (gastroesophageal reflux disease)   Generalized anxiety disorder   Depression   Acquired hypothyroidism   Hypokalemia   COPD exacerbation (HCC)   Acute metabolic encephalopathy   Hyponatremia  Physician Discharge Summary  Asmaa Tirpak ZOX:[id number] DOB: 06-15-1960 DOA: 12/05/2022  PCP: Myrlene Broker, MD Pulmonologist: Dr. Vassie Loll  Admit date: 12/05/2022 Discharge date: 12/07/2022  Admitted From:  HOME HOSPICE  Disposition: HOME HOSPICE   Recommendations for Outpatient Follow-up:  Resume home hospice services Follow up with PCP and pulmonologist  Discharge Condition: HOSPICE    Brief Hospitalization Summary: Please see all hospital notes, images, labs for full details of the hospitalization. Admission Provider HPI:  62 y.o. female with medical history significant of chronic respiratory failure on 3-1/2 L at baseline, COPD, cigarette smoker, generalized anxiety disorder, depression, GERD, hep C, hyperlipidemia, hypertension, hypothyroidism, and more presents the ED with a chief complaint of fall.  Patient reports she became dizzy and fell.  She had felt dizzy all day.  She reports she also felt confused and disoriented.  She thinks she did hit her head.  She denies taking any blood thinners.  She denies any asymmetrical numbness.  She does report left leg weakness at the time of her fall.  Strength seems equal on my exam.  Patient reports that her left leg weakness had been present for a week prior to her fall.  She reports she has had a decreased appetite.  She only eats 1 meal per day.  That is when [employer] comes.  She said this is because she does not like to cook.  She think she has been losing weight but she is not sure how much.  She has never noticed herself wheezing even though it is unclear if she is wheezing during exam, even from across the room.  She reports a cough that is productive of yellow sputum.  She has had no fevers.  She uses her scheduled inhaler as prescribed.  She reports using her rescue inhaler 3-4 times per day.  She has no hemoptysis.  She has no chest pain.  Patient does note swelling in her legs but is not sure when it started.  She has no  other complaints at this time.   Patient is a current smoker.  She does not drink.  Patient is full code.  Hospital Course  This patient was admitted to the hospital after a fall at home and symptoms concerning for acute COPD exacerbation.  There was concern about polypharmacy because she is on multiple sedating medications which we have worked to reduce.  She was encephalopathic on arrival.  Her acute metabolic encephalopathy was secondary to polypharmacy.  We have made some adjustments to her sedating medications to try and reduce the sedating medications.  She is on home hospice care.  She has chronic hypoxic respiratory failures and is on home hospice and followed by a pulmonologist.  She was treated with IV steroids and bronchodilators.  She was also evaluated by PT.  They recommended home health services if it could be arranged with her hospice benefits.  She is breathing better now and back to her baseline supplemental oxygen.  She is eager to return home to hospice services.  She is being discharged in stable condition.  Recommend outpatient follow-up as scheduled.  Discharge Diagnoses:  Principal Problem:   Acute encephalopathy Active Problems:   Tobacco use disorder   Essential hypertension   Chronic respiratory failure with hypoxia (HCC)   GERD (gastroesophageal reflux disease)   Generalized anxiety disorder   Depression   Acquired hypothyroidism   Hypokalemia   COPD exacerbation (HCC)   Acute metabolic encephalopathy   Hyponatremia  Physician Discharge Summary  Asmaa Tirpak ZOX:[id number] DOB: 06-15-1960 DOA: 12/05/2022  PCP: Myrlene Broker, MD Pulmonologist: Dr. Vassie Loll  Admit date: 12/05/2022 Discharge date: 12/07/2022  Admitted From:  HOME HOSPICE  Disposition: HOME HOSPICE   Recommendations for Outpatient Follow-up:  Resume home hospice services Follow up with PCP and pulmonologist  Discharge Condition: HOSPICE    Brief Hospitalization Summary: Please see all hospital notes, images, labs for full details of the hospitalization. Admission Provider HPI:  62 y.o. female with medical history significant of chronic respiratory failure on 3-1/2 L at baseline, COPD, cigarette smoker, generalized anxiety disorder, depression, GERD, hep C, hyperlipidemia, hypertension, hypothyroidism, and more presents the ED with a chief complaint of fall.  Patient reports she became dizzy and fell.  She had felt dizzy all day.  She reports she also felt confused and disoriented.  She thinks she did hit her head.  She denies taking any blood thinners.  She denies any asymmetrical numbness.  She does report left leg weakness at the time of her fall.  Strength seems equal on my exam.  Patient reports that her left leg weakness had been present for a week prior to her fall.  She reports she has had a decreased appetite.  She only eats 1 meal per day.  That is when [employer] comes.  She said this is because she does not like to cook.  She think she has been losing weight but she is not sure how much.  She has never noticed herself wheezing even though it is unclear if she is wheezing during exam, even from across the room.  She reports a cough that is productive of yellow sputum.  She has had no fevers.  She uses her scheduled inhaler as prescribed.  She reports using her rescue inhaler 3-4 times per day.  She has no hemoptysis.  She has no chest pain.  Patient does note swelling in her legs but is not sure when it started.  She has no  other complaints at this time.   Patient is a current smoker.  She does not drink.  Patient is full code.  Hospital Course  This patient was admitted to the hospital after a fall at home and symptoms concerning for acute COPD exacerbation.  There was concern about polypharmacy because she is on multiple sedating medications which we have worked to reduce.  She was encephalopathic on arrival.  Her acute metabolic encephalopathy was secondary to polypharmacy.  We have made some adjustments to her sedating medications to try and reduce the sedating medications.  She is on home hospice care.  She has chronic hypoxic respiratory failures and is on home hospice and followed by a pulmonologist.  She was treated with IV steroids and bronchodilators.  She was also evaluated by PT.  They recommended home health services if it could be arranged with her hospice benefits.  She is breathing better now and back to her baseline supplemental oxygen.  She is eager to return home to hospice services.  She is being discharged in stable condition.  Recommend outpatient follow-up as scheduled.  Discharge Diagnoses:  Principal Problem:   Acute encephalopathy Active Problems:   Tobacco use disorder   Essential hypertension   Chronic respiratory failure with hypoxia (HCC)   GERD (gastroesophageal reflux disease)   Generalized anxiety disorder   Depression   Acquired hypothyroidism   Hypokalemia   COPD exacerbation (HCC)   Acute metabolic encephalopathy   Hyponatremia  Physician Discharge Summary  Asmaa Tirpak ZOX:[id number] DOB: 06-15-1960 DOA: 12/05/2022  PCP: Myrlene Broker, MD Pulmonologist: Dr. Vassie Loll  Admit date: 12/05/2022 Discharge date: 12/07/2022  Admitted From:  HOME HOSPICE  Disposition: HOME HOSPICE   Recommendations for Outpatient Follow-up:  Resume home hospice services Follow up with PCP and pulmonologist  Discharge Condition: HOSPICE    Brief Hospitalization Summary: Please see all hospital notes, images, labs for full details of the hospitalization. Admission Provider HPI:  62 y.o. female with medical history significant of chronic respiratory failure on 3-1/2 L at baseline, COPD, cigarette smoker, generalized anxiety disorder, depression, GERD, hep C, hyperlipidemia, hypertension, hypothyroidism, and more presents the ED with a chief complaint of fall.  Patient reports she became dizzy and fell.  She had felt dizzy all day.  She reports she also felt confused and disoriented.  She thinks she did hit her head.  She denies taking any blood thinners.  She denies any asymmetrical numbness.  She does report left leg weakness at the time of her fall.  Strength seems equal on my exam.  Patient reports that her left leg weakness had been present for a week prior to her fall.  She reports she has had a decreased appetite.  She only eats 1 meal per day.  That is when [employer]als [employer] Whe[employer] comes.  She said this is because she does not like to cook.  She think she has been losing weight but she is not sure how much.  She has never noticed herself wheezing even though it is unclear if she is wheezing during exam, even from across the room.  She reports a cough that is productive of yellow sputum.  She has had no fevers.  She uses her scheduled inhaler as prescribed.  She reports using her rescue inhaler 3-4 times per day.  She has no hemoptysis.  She has no chest pain.  Patient does note swelling in her legs but is not sure when it started.  She has no  other complaints at this time.   Patient is a current smoker.  She does not drink.  Patient is full code.  Hospital Course  This patient was admitted to the hospital after a fall at home and symptoms concerning for acute COPD exacerbation.  There was concern about polypharmacy because she is on multiple sedating medications which we have worked to reduce.  She was encephalopathic on arrival.  Her acute metabolic encephalopathy was secondary to polypharmacy.  We have made some adjustments to her sedating medications to try and reduce the sedating medications.  She is on home hospice care.  She has chronic hypoxic respiratory failures and is on home hospice and followed by a pulmonologist.  She was treated with IV steroids and bronchodilators.  She was also evaluated by PT.  They recommended home health services if it could be arranged with her hospice benefits.  She is breathing better now and back to her baseline supplemental oxygen.  She is eager to return home to hospice services.  She is being discharged in stable condition.  Recommend outpatient follow-up as scheduled.  Discharge Diagnoses:  Principal Problem:   Acute encephalopathy Active Problems:   Tobacco use disorder   Essential hypertension   Chronic respiratory failure with hypoxia (HCC)   GERD (gastroesophageal reflux disease)   Generalized anxiety disorder   Depression   Acquired hypothyroidism   Hypokalemia   COPD exacerbation (HCC)   Acute metabolic encephalopathy   Hyponatremia  guaiFENesin-Codeine 225-7.5 MG/5ML Liqd Take 5 mLs by mouth 3 (three) times daily as needed.   levothyroxine 25 MCG tablet Commonly known as: SYNTHROID TAKE 1 TABLET BY MOUTH DAILY  BEFORE BREAKFAST   MELATONIN PO Take 5 mg by mouth at bedtime as needed (sleep).   montelukast 10 MG tablet Commonly known as: SINGULAIR TAKE 1 TABLET BY MOUTH AT  BEDTIME   naproxen 500 MG tablet Commonly known as: NAPROSYN Take 1 tablet (500 mg total) by mouth 2 (two) times daily with a meal. What changed:  when to take this reasons to take this   OXYGEN Inhale 3 L/min into the lungs See admin instructions. Inhale  3 L/min of oxygen into the lungs w/CPAP at bedtime and as needed for shortness of breath with "strenuous activity" during the day   pantoprazole 40 MG tablet Commonly known as: PROTONIX TAKE 1 TABLET BY MOUTH DAILY What changed:  when to take this reasons to take this   pregabalin 300 MG capsule Commonly known as: LYRICA TAKE 1 CAPSULE BY MOUTH TWICE  DAILY   QUEtiapine 100 MG tablet Commonly known as: SEROQUEL TAKE 1 TABLET BY MOUTH AT  BEDTIME   roflumilast 500 MCG Tabs tablet Commonly known as: DALIRESP Take 1 tablet (500 mcg total) by mouth daily.   silver sulfADIAZINE 1 % cream Commonly known as: Silvadene Apply 1 Application topically daily.   venlafaxine XR 150 MG 24 hr capsule Commonly known as: EFFEXOR-XR TAKE 1 CAPSULE BY MOUTH DAILY  WITH BREAKFAST   witch hazel-glycerin pad Commonly known as: TUCKS Apply topically as needed for itching. What changed:  how much to take when to take this        Procedures/Studies: MR BRAIN WO CONTRAST  Result Date: 12/06/2022 CLINICAL DATA:  Neuro  deficit, acute, stroke suspected EXAM: MRI HEAD WITHOUT CONTRAST TECHNIQUE: Multiplanar, multiecho pulse sequences of the brain and surrounding structures were obtained without intravenous contrast. COMPARISON:  CT head 12/05/2022. FINDINGS: Brain: No acute infarction, acute hemorrhage, hydrocephalus, extra-axial collection or mass lesion. Artifact limits assessment the posterior fossa on diffusion-weighted and set to weighted imaging. Vascular: Major arterial flow voids are maintained at the skull base. Skull and upper cervical spine: Normal marrow signal. Sinuses/Orbits: Clear sinuses.  No acute orbital findings. Other: No mastoid effusion. IMPRESSION: No evidence of acute intracranial abnormality. Electronically Signed   By: Feliberto Harts M.D.   On: 12/06/2022 17:37   ECHOCARDIOGRAM COMPLETE  Result Date: 12/06/2022    ECHOCARDIOGRAM REPORT   Patient Name:   Joan [name] Date of Exam: 12/06/2022 Medical Rec #:  [id number]           Height:       68.0 in Accession #:    [id number]          Weight:       180.0 lb Date of Birth:  08-Nov-1960            BSA:          1.954 m Patient Age:    62 years            BP:           122/75 mmHg Patient Gender: F                   HR:           81 bpm. Exam Location:  [facility] Procedure: 2D Echo, Cardiac Doppler and Color Doppler Indications:    CHF  guaiFENesin-Codeine 225-7.5 MG/5ML Liqd Take 5 mLs by mouth 3 (three) times daily as needed.   levothyroxine 25 MCG tablet Commonly known as: SYNTHROID TAKE 1 TABLET BY MOUTH DAILY  BEFORE BREAKFAST   MELATONIN PO Take 5 mg by mouth at bedtime as needed (sleep).   montelukast 10 MG tablet Commonly known as: SINGULAIR TAKE 1 TABLET BY MOUTH AT  BEDTIME   naproxen 500 MG tablet Commonly known as: NAPROSYN Take 1 tablet (500 mg total) by mouth 2 (two) times daily with a meal. What changed:  when to take this reasons to take this   OXYGEN Inhale 3 L/min into the lungs See admin instructions. Inhale  3 L/min of oxygen into the lungs w/CPAP at bedtime and as needed for shortness of breath with "strenuous activity" during the day   pantoprazole 40 MG tablet Commonly known as: PROTONIX TAKE 1 TABLET BY MOUTH DAILY What changed:  when to take this reasons to take this   pregabalin 300 MG capsule Commonly known as: LYRICA TAKE 1 CAPSULE BY MOUTH TWICE  DAILY   QUEtiapine 100 MG tablet Commonly known as: SEROQUEL TAKE 1 TABLET BY MOUTH AT  BEDTIME   roflumilast 500 MCG Tabs tablet Commonly known as: DALIRESP Take 1 tablet (500 mcg total) by mouth daily.   silver sulfADIAZINE 1 % cream Commonly known as: Silvadene Apply 1 Application topically daily.   venlafaxine XR 150 MG 24 hr capsule Commonly known as: EFFEXOR-XR TAKE 1 CAPSULE BY MOUTH DAILY  WITH BREAKFAST   witch hazel-glycerin pad Commonly known as: TUCKS Apply topically as needed for itching. What changed:  how much to take when to take this        Procedures/Studies: MR BRAIN WO CONTRAST  Result Date: 12/06/2022 CLINICAL DATA:  Neuro  deficit, acute, stroke suspected EXAM: MRI HEAD WITHOUT CONTRAST TECHNIQUE: Multiplanar, multiecho pulse sequences of the brain and surrounding structures were obtained without intravenous contrast. COMPARISON:  CT head 12/05/2022. FINDINGS: Brain: No acute infarction, acute hemorrhage, hydrocephalus, extra-axial collection or mass lesion. Artifact limits assessment the posterior fossa on diffusion-weighted and set to weighted imaging. Vascular: Major arterial flow voids are maintained at the skull base. Skull and upper cervical spine: Normal marrow signal. Sinuses/Orbits: Clear sinuses.  No acute orbital findings. Other: No mastoid effusion. IMPRESSION: No evidence of acute intracranial abnormality. Electronically Signed   By: Feliberto Harts M.D.   On: 12/06/2022 17:37   ECHOCARDIOGRAM COMPLETE  Result Date: 12/06/2022    ECHOCARDIOGRAM REPORT   Patient Name:   Joan [name] Date of Exam: 12/06/2022 Medical Rec #:  [id number]           Height:       68.0 in Accession #:    [id number]          Weight:       180.0 lb Date of Birth:  08-Nov-1960            BSA:          1.954 m Patient Age:    62 years            BP:           122/75 mmHg Patient Gender: F                   HR:           81 bpm. [facility] Procedure: 2D Echo, Cardiac Doppler and Color Doppler Indications:    CHF  guaiFENesin-Codeine 225-7.5 MG/5ML Liqd Take 5 mLs by mouth 3 (three) times daily as needed.   levothyroxine 25 MCG tablet Commonly known as: SYNTHROID TAKE 1 TABLET BY MOUTH DAILY  BEFORE BREAKFAST   MELATONIN PO Take 5 mg by mouth at bedtime as needed (sleep).   montelukast 10 MG tablet Commonly known as: SINGULAIR TAKE 1 TABLET BY MOUTH AT  BEDTIME   naproxen 500 MG tablet Commonly known as: NAPROSYN Take 1 tablet (500 mg total) by mouth 2 (two) times daily with a meal. What changed:  when to take this reasons to take this   OXYGEN Inhale 3 L/min into the lungs See admin instructions. Inhale  3 L/min of oxygen into the lungs w/CPAP at bedtime and as needed for shortness of breath with "strenuous activity" during the day   pantoprazole 40 MG tablet Commonly known as: PROTONIX TAKE 1 TABLET BY MOUTH DAILY What changed:  when to take this reasons to take this   pregabalin 300 MG capsule Commonly known as: LYRICA TAKE 1 CAPSULE BY MOUTH TWICE  DAILY   QUEtiapine 100 MG tablet Commonly known as: SEROQUEL TAKE 1 TABLET BY MOUTH AT  BEDTIME   roflumilast 500 MCG Tabs tablet Commonly known as: DALIRESP Take 1 tablet (500 mcg total) by mouth daily.   silver sulfADIAZINE 1 % cream Commonly known as: Silvadene Apply 1 Application topically daily.   venlafaxine XR 150 MG 24 hr capsule Commonly known as: EFFEXOR-XR TAKE 1 CAPSULE BY MOUTH DAILY  WITH BREAKFAST   witch hazel-glycerin pad Commonly known as: TUCKS Apply topically as needed for itching. What changed:  how much to take when to take this        Procedures/Studies: MR BRAIN WO CONTRAST  Result Date: 12/06/2022 CLINICAL DATA:  Neuro  deficit, acute, stroke suspected EXAM: MRI HEAD WITHOUT CONTRAST TECHNIQUE: Multiplanar, multiecho pulse sequences of the brain and surrounding structures were obtained without intravenous contrast. COMPARISON:  CT head 12/05/2022. FINDINGS: Brain: No acute infarction, acute hemorrhage, hydrocephalus, extra-axial collection or mass lesion. Artifact limits assessment the posterior fossa on diffusion-weighted and set to weighted imaging. Vascular: Major arterial flow voids are maintained at the skull base. Skull and upper cervical spine: Normal marrow signal. Sinuses/Orbits: Clear sinuses.  No acute orbital findings. Other: No mastoid effusion. IMPRESSION: No evidence of acute intracranial abnormality. Electronically Signed   By: Feliberto Harts M.D.   On: 12/06/2022 17:37   ECHOCARDIOGRAM COMPLETE  Result Date: 12/06/2022    ECHOCARDIOGRAM REPORT   Patient Name:   Joan [name] Date of Exam: 12/06/2022 Medical Rec #:  [id number]           Height:       68.0 in Accession #:    [id number]          Weight:       180.0 lb Date of Birth:  08-Nov-1960            BSA:          1.954 m Patient Age:    62 years            BP:           122/75 mmHg Patient Gender: F                   HR:           81 bpm. [facility] Procedure: 2D Echo, Cardiac Doppler and Color Doppler Indications:    CHF  guaiFENesin-Codeine 225-7.5 MG/5ML Liqd Take 5 mLs by mouth 3 (three) times daily as needed.   levothyroxine 25 MCG tablet Commonly known as: SYNTHROID TAKE 1 TABLET BY MOUTH DAILY  BEFORE BREAKFAST   MELATONIN PO Take 5 mg by mouth at bedtime as needed (sleep).   montelukast 10 MG tablet Commonly known as: SINGULAIR TAKE 1 TABLET BY MOUTH AT  BEDTIME   naproxen 500 MG tablet Commonly known as: NAPROSYN Take 1 tablet (500 mg total) by mouth 2 (two) times daily with a meal. What changed:  when to take this reasons to take this   OXYGEN Inhale 3 L/min into the lungs See admin instructions. Inhale  3 L/min of oxygen into the lungs w/CPAP at bedtime and as needed for shortness of breath with "strenuous activity" during the day   pantoprazole 40 MG tablet Commonly known as: PROTONIX TAKE 1 TABLET BY MOUTH DAILY What changed:  when to take this reasons to take this   pregabalin 300 MG capsule Commonly known as: LYRICA TAKE 1 CAPSULE BY MOUTH TWICE  DAILY   QUEtiapine 100 MG tablet Commonly known as: SEROQUEL TAKE 1 TABLET BY MOUTH AT  BEDTIME   roflumilast 500 MCG Tabs tablet Commonly known as: DALIRESP Take 1 tablet (500 mcg total) by mouth daily.   silver sulfADIAZINE 1 % cream Commonly known as: Silvadene Apply 1 Application topically daily.   venlafaxine XR 150 MG 24 hr capsule Commonly known as: EFFEXOR-XR TAKE 1 CAPSULE BY MOUTH DAILY  WITH BREAKFAST   witch hazel-glycerin pad Commonly known as: TUCKS Apply topically as needed for itching. What changed:  how much to take when to take this        Procedures/Studies: MR BRAIN WO CONTRAST  Result Date: 12/06/2022 CLINICAL DATA:  Neuro  deficit, acute, stroke suspected EXAM: MRI HEAD WITHOUT CONTRAST TECHNIQUE: Multiplanar, multiecho pulse sequences of the brain and surrounding structures were obtained without intravenous contrast. COMPARISON:  CT head 12/05/2022. FINDINGS: Brain: No acute infarction, acute hemorrhage, hydrocephalus, extra-axial collection or mass lesion. Artifact limits assessment the posterior fossa on diffusion-weighted and set to weighted imaging. Vascular: Major arterial flow voids are maintained at the skull base. Skull and upper cervical spine: Normal marrow signal. Sinuses/Orbits: Clear sinuses.  No acute orbital findings. Other: No mastoid effusion. IMPRESSION: No evidence of acute intracranial abnormality. Electronically Signed   By: Feliberto Harts M.D.   On: 12/06/2022 17:37   ECHOCARDIOGRAM COMPLETE  Result Date: 12/06/2022    ECHOCARDIOGRAM REPORT   Patient Name:   Joan [name] Date of Exam: 12/06/2022 Medical Rec #:  [id number]           Height:       68.0 in Accession #:    [id number]          Weight:       180.0 lb Date of Birth:  08-Nov-1960            BSA:          1.954 m Patient Age:    62 years            BP:           122/75 mmHg Patient Gender: F                   HR:           81 bpm. [facility] Procedure: 2D Echo, Cardiac Doppler and Color Doppler Indications:    CHF

## 2022-12-07 NOTE — Hospital Course (Signed)
62 y.o. female with medical history significant of chronic respiratory failure on 3-1/2 L at baseline, COPD, cigarette smoker, generalized anxiety disorder, depression, GERD, hep C, hyperlipidemia, hypertension, hypothyroidism, and more presents the ED with a chief complaint of fall.  Patient reports she became dizzy and fell.  She had felt dizzy all day.  She reports she also felt confused and disoriented.  She thinks she did hit her head.  She denies taking any blood thinners.  She denies any asymmetrical numbness.  She does report left leg weakness at the time of her fall.  Strength seems equal on my exam.  Patient reports that her left leg weakness had been present for a week prior to her fall.  She reports she has had a decreased appetite.  She only eats 1 meal per day.  That is when Meals on Wheels comes.  She said this is because she does not like to cook.  She think she has been losing weight but she is not sure how much.  She has never noticed herself wheezing even though it is unclear if she is wheezing during exam, even from across the room.  She reports a cough that is productive of yellow sputum.  She has had no fevers.  She uses her scheduled inhaler as prescribed.  She reports using her rescue inhaler 3-4 times per day.  She has no hemoptysis.  She has no chest pain.  Patient does note swelling in her legs but is not sure when it started.  She has no other complaints at this time.   Patient is a current smoker.  She does not drink.  Patient is full code.

## 2022-12-07 NOTE — Discharge Instructions (Signed)
IMPORTANT INFORMATION: PAY CLOSE ATTENTION   PHYSICIAN DISCHARGE INSTRUCTIONS  Follow with Primary care provider  Joan Broker, MD  and other consultants as instructed by your Hospitalist Physician  SEEK MEDICAL CARE OR RETURN TO EMERGENCY ROOM IF SYMPTOMS COME BACK, WORSEN OR NEW PROBLEM DEVELOPS   Please note: You were cared for by a hospitalist during your hospital stay. Every effort will be made to forward records to your primary care provider.  You can request that your primary care provider send for your hospital records if they have not received them.  Once you are discharged, your primary care physician will handle any further medical issues. Please note that NO REFILLS for any discharge medications will be authorized once you are discharged, as it is imperative that you return to your primary care physician (or establish a relationship with a primary care physician if you do not have one) for your post hospital discharge needs so that they can reassess your need for medications and monitor your lab values.  Please get a complete blood count and chemistry panel checked by your Primary MD at your next visit, and again as instructed by your Primary MD.  Get Medicines reviewed and adjusted: Please take all your medications with you for your next visit with your Primary MD  Laboratory/radiological data: Please request your Primary MD to go over all hospital tests and procedure/radiological results at the follow up, please ask your primary care provider to get all Hospital records sent to his/her office.  In some cases, they will be blood work, cultures and biopsy results pending at the time of your discharge. Please request that your primary care provider follow up on these results.  If you are diabetic, please bring your blood sugar readings with you to your follow up appointment with primary care.    Please call and make your follow up appointments as soon as possible.    Also  Note the following: If you experience worsening of your admission symptoms, develop shortness of breath, life threatening emergency, suicidal or homicidal thoughts you must seek medical attention immediately by calling 911 or calling your MD immediately  if symptoms less severe.  You must read complete instructions/literature along with all the possible adverse reactions/side effects for all the Medicines you take and that have been prescribed to you. Take any new Medicines after you have completely understood and accpet all the possible adverse reactions/side effects.   Do not drive when taking Pain medications or sleeping medications (Benzodiazepines)  Do not take more than prescribed Pain, Sleep and Anxiety Medications. It is not advisable to combine anxiety,sleep and pain medications without talking with your primary care practitioner  Special Instructions: If you have smoked or chewed Tobacco  in the last 2 yrs please stop smoking, stop any regular Alcohol  and or any Recreational drug use.  Wear Seat belts while driving.  Do not drive if taking any narcotic, mind altering or controlled substances or recreational drugs or alcohol.

## 2022-12-09 LAB — GLUCOSE, CAPILLARY: Glucose-Capillary: 147 mg/dL — ABNORMAL HIGH (ref 70–99)

## 2022-12-10 ENCOUNTER — Telehealth: Payer: Self-pay | Admitting: *Deleted

## 2022-12-10 NOTE — Transitions of Care (Post Inpatient/ED Visit) (Signed)
12/10/2022  Name: Joan [name] MRN: [id number] DOB: August 01, 1960  Today's TOC FU Call Status: Today's TOC FU Call Status:: Successful TOC FU Call Completed TOC FU Call Complete Date: 12/10/22 Patient's Name and Date of Birth confirmed.  Transition Care Management Follow-up Telephone Call Date of Discharge: 12/07/22 [facility] Type of Discharge: Inpatient Admission Primary Inpatient Discharge Diagnosis:: COPD exacerbation  TOC Care Coordination outreach:  verified from review of hospital discharge notes on 12/07/22 that patient is currently established and active with [facility]-- Spoke with "Amber" and confirmed their agency is currently well established and active; aware of recent hospital discharge on 12/07/22   Items Reviewed:    Medications Reviewed Today: Medications Reviewed Today     Reviewed by Michaela Corner, RN (Registered Nurse) on 12/10/22 at 1248  Med List Status: <None>   Medication Order Taking? Sig Documenting Provider Last Dose Status Informant  amLODipine (NORVASC) 5 MG tablet [id number] No Take 1 tablet (5 mg total) by mouth daily. Narda Bonds, MD 12/05/2022 Active Other, Pharmacy Records  atenolol (TENORMIN) 25 MG tablet [id number] No TAKE 1 TABLET BY MOUTH DAILY Myrlene Broker, MD 12/05/2022 0800 Active Other, Pharmacy Records  atorvastatin (LIPITOR) 20 MG tablet [id number] No TAKE 1 TABLET BY MOUTH ONCE  DAILY Myrlene Broker, MD 12/05/2022 Active Other, Pharmacy Records           Med Note Leafy Ro Dec 05, 2022  8:13 PM)    buPROPion (WELLBUTRIN XL) 300 MG 24 hr tablet [id number] No TAKE 1 TABLET BY MOUTH DAILY Thresa Ross, MD 12/05/2022 Active Other, Pharmacy Records           Med Note Rocky Ripple, Dava Najjar Dec 05, 2022  8:13 PM)    diazepam (VALIUM) 5 MG tablet [id number]  Take 1 tablet (5 mg total) by mouth every 6 (six) hours as needed for anxiety. Johnson, Clanford L,  MD  Active   fluticasone (FLONASE) 50 MCG/ACT nasal spray [id number]  Place 2 sprays into both nostrils daily as needed for allergies. Cleora Fleet, MD  Active   guaiFENesin-Codeine 225-7.5 MG/5ML LIQD [id number] No Take 5 mLs by mouth 3 (three) times daily as needed. Oretha Milch, MD 12/05/2022 Active Other, Pharmacy Records  Ipratropium-Albuterol (COMBIVENT RESPIMAT) 20-100 MCG/ACT AERS respimat [id number] No Inhale 1 puff into the lungs every 6 (six) hours as needed for wheezing or shortness of breath. Bevelyn Ngo, NP 12/05/2022 Active Other, Pharmacy Records  levothyroxine (SYNTHROID) 25 MCG tablet [id number] No TAKE 1 TABLET BY MOUTH DAILY  BEFORE BREAKFAST  Patient taking differently: Take 25 mcg by mouth daily before breakfast.   Myrlene Broker, MD 12/05/2022 Active Other, Pharmacy Records           Med Note (SATTERFIELD, Vito Berger Oct 10, 2022  8:48 PM) Patient verified she is still taking this medication   MELATONIN PO [id number] No Take 5 mg by mouth at bedtime as needed (sleep). [provider] 12/04/2022 Active Other, Pharmacy Records  montelukast (SINGULAIR) 10 MG tablet [id number] No TAKE 1 TABLET BY MOUTH AT  BEDTIME  Patient taking differently: Take 10 mg by mouth at bedtime.   Oretha Milch, MD 12/04/2022 Active Other, Pharmacy Records  naproxen (NAPROSYN) 500 MG tablet [id number] No Take 1 tablet (500 mg total) by mouth 2 (two) times daily with a meal.  Patient taking differently: Take 500 mg  by mouth daily as needed for mild pain or headache.   Myrlene Broker, MD PRN PRN Active Other, Pharmacy Records  OXYGEN [id number] No Inhale 3 L/min into the lungs See admin instructions. Inhale  3 L/min of oxygen into the lungs w/CPAP at bedtime and as needed for shortness of breath with "strenuous activity" during the day [provider] Taking Active Other, Pharmacy Records  pantoprazole (PROTONIX) 40 MG tablet [id number] No TAKE 1 TABLET BY MOUTH  DAILY  Patient taking differently: Take 40 mg by mouth daily as needed (For reflux per patient).   Myrlene Broker, MD 12/05/2022 Active Other, Pharmacy Records           Med Note (SATTERFIELD, Vito Berger Oct 10, 2022  8:50 PM) Patient verified she is taking as needed   pregabalin (LYRICA) 300 MG capsule [id number] No TAKE 1 CAPSULE BY MOUTH TWICE  DAILY Myrlene Broker, MD 12/05/2022 Active Other, Pharmacy Records  QUEtiapine (SEROQUEL) 100 MG tablet [id number] No TAKE 1 TABLET BY MOUTH AT  BEDTIME Thresa Ross, MD 12/05/2022 Active Other, Pharmacy Records           Med Note (S[place]TERFIELD, Vito Berger Oct 10, 2022  8:50 PM) Patient verified she is taking this medication   roflumilast (DALIRESP) 500 MCG TABS tablet [id number] No Take 1 tablet (500 mcg total) by mouth daily. Bevelyn Ngo, NP 12/05/2022 Active Other, Pharmacy Records  silver sulfADIAZINE (SILVADENE) 1 % cream [id number] No Apply 1 Application topically daily. Felecia Shelling, DPM PRN PRN Active Other, Pharmacy Records           Med Note (S[place]TER[place], Vito Berger Oct 10, 2022  8:51 PM) Patient states she applies every day   venlafaxine XR (EFFEXOR-XR) 150 MG 24 hr capsule [id number] No TAKE 1 CAPSULE BY MOUTH DAILY  WITH BREAKFAST  Patient taking differently: Take 150 mg by mouth daily with breakfast.   Myrlene Broker, MD 12/04/2022 Active Other, Pharmacy Records  witch hazel-glycerin (TUCKS) pad [id number] No Apply topically as needed for itching.  Patient taking differently: Apply 1 Application topically daily as needed for itching.   Standley Brooking, MD PRN PRN Active Other, Pharmacy Records            Home Care and Equipment/Supplies:    Functional Questionnaire:    Follow up appointments reviewed:    Caryl Pina, RN, BSN, CCRN Alumnus RN Care Manager  Transitions of Care  [facility] [phone]: direct office

## 2022-12-13 ENCOUNTER — Other Ambulatory Visit: Payer: Self-pay | Admitting: Internal Medicine

## 2022-12-14 ENCOUNTER — Other Ambulatory Visit: Payer: Self-pay | Admitting: Internal Medicine

## 2022-12-16 ENCOUNTER — Telehealth (INDEPENDENT_AMBULATORY_CARE_PROVIDER_SITE_OTHER): Payer: Medicare Other | Admitting: Psychiatry

## 2022-12-16 ENCOUNTER — Encounter (HOSPITAL_COMMUNITY): Payer: Self-pay | Admitting: Psychiatry

## 2022-12-16 DIAGNOSIS — F0630 Mood disorder due to known physiological condition, unspecified: Secondary | ICD-10-CM

## 2022-12-16 DIAGNOSIS — G4700 Insomnia, unspecified: Secondary | ICD-10-CM | POA: Diagnosis not present

## 2022-12-16 DIAGNOSIS — J441 Chronic obstructive pulmonary disease with (acute) exacerbation: Secondary | ICD-10-CM | POA: Diagnosis not present

## 2022-12-16 DIAGNOSIS — F4310 Post-traumatic stress disorder, unspecified: Secondary | ICD-10-CM

## 2022-12-16 DIAGNOSIS — J9611 Chronic respiratory failure with hypoxia: Secondary | ICD-10-CM | POA: Diagnosis not present

## 2022-12-16 DIAGNOSIS — G4733 Obstructive sleep apnea (adult) (pediatric): Secondary | ICD-10-CM | POA: Diagnosis not present

## 2022-12-16 DIAGNOSIS — F411 Generalized anxiety disorder: Secondary | ICD-10-CM | POA: Diagnosis not present

## 2022-12-16 DIAGNOSIS — J449 Chronic obstructive pulmonary disease, unspecified: Secondary | ICD-10-CM | POA: Diagnosis not present

## 2022-12-16 DIAGNOSIS — F1491 Cocaine use, unspecified, in remission: Secondary | ICD-10-CM | POA: Diagnosis not present

## 2022-12-16 NOTE — Progress Notes (Signed)
[facility] Follow up visit  Patient Identification: Joan [name] MRN:  [id number] Date of Evaluation:  12/16/2022 Referral Source:Therapist [place] B[place]ze Chief Complaint:    depression follow up  Visit Diagnosis:    ICD-10-CM   1. Mood disorder in conditions classified elsewhere  F06.30     2. PTSD (post-traumatic stress disorder)  F43.10     3. GAD (generalized anxiety disorder)  F41.1      Virtual Visit via Video Note  I connected with Joan [name] on 12/16/22 at  3:00 PM EDT by a video enabled telemedicine application and verified that I am speaking with the correct person using two identifiers.  Location: Patient: parked car Provider: home office   I discussed the limitations of evaluation and management by telemedicine and the availability of in person appointments. The patient expressed understanding and agreed to proceed.      I discussed the assessment and treatment plan with the patient. The patient was provided an opportunity to ask questions and all were answered. The patient agreed with the plan and demonstrated an understanding of the instructions.   The patient was advised to call back or seek an in-person evaluation if the symptoms worsen or if the condition fails to improve as anticipated.  I provided 20 minutes of non-face-to-face time during this encounter.      History of Present Illness: Patient is a 62 years old currently widow Caucasian female initially referred by her therapist for management of mood symptoms, depression possible PTSD and anxiety  Patient has been in rehab after being admitted for COPD exacerbation She has been admitted recently for COPD exacerbation and recent fall,  States has been doing fair mood wise and cannot sleep without trazadone,  She understands the risk of sedating meds but says not using valium except for night   Seroquel helps with AH and sleep as well, but wants to re start trazadone 50mg  if not 100mg  as  otherwise her sleep and mood remains disturbed.  I cautioned about its use and risk but she feels her mood and sleep need to be balanced  She will also connect with sleep clinic to review her cpap machine as she is not using and it has helped her sleep and use Oxygen in the past at night;   Gets lonely   Aggravating factor; difficult childhood, grief,difficult relationship with middle kid, hip replacement medical comorbidity, lonlines Modifying factor: brother, family  Duration adult life Severity ; manageable mood wise,     Past Psychiatric History: depression, drug use  Previous Psychotropic Medications: Yes   Substance Abuse History in the last 12 months:  No.  Consequences of Substance Abuse: history of extensive drug use uptil 8 years ago and have been in rehab programs  Past Medical History:  Past Medical History:  Diagnosis Date   Acute respiratory failure with hypoxia (HCC)    Alcoholism and drug addiction in family    Anxiety    Appendicitis    Arthritis    "knees, hips, lower back" (09/25/2016)   Asthma    Bipolar disorder (HCC)    Chronic bronchitis (HCC)    "all the time" (09/25/2016)   Chronic leg pain    RLE   Chronic lower back pain    COPD (chronic obstructive pulmonary disease) (HCC)    Depression    Diabetes mellitus without complication (HCC)    Diabetic neuropathy (HCC)    Early cataracts, bilateral    Emphysema of lung (HCC)  Generalized anxiety disorder 02/09/2020   GERD (gastroesophageal reflux disease)    Headache    Hepatitis C    "treated back in 2001-2002"   High cholesterol    Hypertension    Hypothyroidism    Incarcerated ventral hernia 04/04/2017   Mood disorder in conditions classified elsewhere 02/09/2020   On home oxygen therapy    "2L; at nighttime" (04/04/2017)   OSA on CPAP    CPAP with Oxygen 3 liters   Paranoid (HCC)    Pneumonia    "all the time" (09/25/2016)   Positive PPD    with negative chest x ray   PTSD  (post-traumatic stress disorder) 02/09/2020   Tachycardia    patient reports with exertion ; denies any other conditions    Thyroid disease    Wears glasses    Wears partial dentures     Past Surgical History:  Procedure Laterality Date   ANTERIOR CERVICAL DECOMP/DISCECTOMY FUSION N/A 02/14/2022   Procedure: Anterior Cervical Decompression/Discectomy Fusion Cervical Three-Cervical Four;  Surgeon: Bedelia Person, MD;  Location[facility] OR;  Service: Neurosurgery;  Laterality: N/A;  3C   APPENDECTOMY     BACK SURGERY     BIOPSY  11/24/2018   Procedure: BIOPSY;  Surgeon: Tressia Danas, MD;  Location: [facility];  Service: Gastroenterology;;   CARDIAC CATHETERIZATION     COLONOSCOPY     COLONOSCOPY WITH PROPOFOL N/A 11/24/2018   Procedure: COLONOSCOPY WITH PROPOFOL;  Surgeon: Tressia Danas, MD;  Location: [facility];  Service: Gastroenterology;  Laterality: N/A;   CYST EXCISION     removal of cyst in sinuses   DILATION AND CURETTAGE OF UTERUS     ESOPHAGOGASTRODUODENOSCOPY (EGD) WITH PROPOFOL N/A 11/24/2018   Procedure: ESOPHAGOGASTRODUODENOSCOPY (EGD) WITH PROPOFOL;  Surgeon: Tressia Danas, MD;  Location: [facility];  Service: Gastroenterology;  Laterality: N/A;   FOOT SURGERY Bilateral    bone removed from 4th and 5 th toes and put in pin then took pin out   HERNIA REPAIR     INCISION AND DRAINAGE  ~ 2014/2015   "removed mesh & infection"   INCISIONAL HERNIA REPAIR N/A 04/04/2017   Procedure: LAPAROSCOPIC INCISIONAL HERNIA REPAIR WITH MESH;  Surgeon: Claud Kelp, MD;  Location: [facility] OR;  Service: General;  Laterality: N/A;   LACERATION REPAIR Right    repair wrist artery from laceration from ice skate   LAPAROSCOPIC APPENDECTOMY N/A 03/31/2019   Procedure: APPENDECTOMY LAPAROSCOPIC;  Surgeon: Axel Filler, MD;  Location: [facility];  Service: General;  Laterality: N/A;   LAPAROSCOPIC APPENDECTOMY N/A 07/09/2019   Procedure: APPENDECTOMY LAPAROSCOPIC;  Surgeon:  Axel Filler, MD;  Location: [facility] OR;  Service: General;  Laterality: N/A;   LAPAROSCOPIC INCISIONAL / UMBILICAL / VENTRAL HERNIA REPAIR  04/04/2017   LAPAROSCOPIC INCISIONAL HERNIA REPAIR WITH MESH   LIPOMA EXCISION Right    fattty lipoma in neck   NASAL SEPTUM SURGERY     POSTERIOR LUMBAR FUSION  2015/2016 X 2   "added rods and screws"   SINOSCOPY     TOTAL HIP ARTHROPLASTY Right 02/05/2017   Procedure: RIGHT TOTAL HIP ARTHROPLASTY;  Surgeon: Ranee Gosselin, MD;  Location: [facility];  Service: Orthopedics;  Laterality: Right;   TOTAL HIP ARTHROPLASTY Left 06/18/2017   Procedure: LEFT TOTAL HIP ARTHROPLASTY;  Surgeon: Ranee Gosselin, MD;  Location: [facility];  Service: Orthopedics;  Laterality: Left;   TOTAL HIP REVISION Left 01/26/2019   Procedure: TOTAL HIP REVISION;  Surgeon: Durene Romans, MD;  Location: [facility];  Service: Orthopedics;  Laterality: Left;  2 hrs   TUBAL LIGATION     UMBILICAL HERNIA REPAIR  ~ 2013   w/mesh   UPPER GI ENDOSCOPY      Family Psychiatric History: FAther : alcohol use  Family History:  Family History  Problem Relation Age of Onset   Diabetes Mother    Hypertension Mother    Hypertension Father    Cancer Father        lung   Breast cancer Neg Hx     Social History:   Social History   Socioeconomic History   Marital status: Widowed    Spouse name: Not on file   Number of children: 3   Years of education: Not on file   Highest education level: 9th grade  Occupational History   Not on file  Tobacco Use   Smoking status: Every Day    Current packs/day: 2.00    Average packs/day: 2.0 packs/day for 47.8 years (95.6 ttl pk-yrs)    Types: Cigarettes    Start date: 1977    Passive exposure: Current   Smokeless tobacco: Never   Tobacco comments:    1ppd as of 07/30/22 ep  Vaping Use   Vaping status: Never Used  Substance and Sexual Activity   Alcohol use: Not Currently   Drug use: Not Currently    Types: "Crack" cocaine    Comment:   "nothing since 01/12/2012"   Sexual activity: Not Currently    Partners: Male  Other Topics Concern   Not on file  Social History Narrative   Not on file   Social Determinants of Health   Financial Resource Strain: High Risk (08/30/2022)   Overall Financial Resource Strain (CARDIA)    Difficulty of Paying Living Expenses: Hard  Food Insecurity: No Food Insecurity (12/06/2022)   Hunger Vital Sign    Worried About Running Out of Food in the Last Year: Never true    Ran Out of Food in the Last Year: Never true  Transportation Needs: No Transportation Needs (12/06/2022)   PRAPARE - Administrator, Civil Service (Medical): No    Lack of Transportation (Non-Medical): No  Physical Activity: Inactive (08/30/2022)   Exercise Vital Sign    Days of Exercise per Week: 0 days    Minutes of Exercise per Session: 0 min  Stress: No Stress Concern Present (08/30/2022)   [employer] of Occupational Health - Occupational Stress Questionnaire    Feeling of Stress : Only a little  Recent Concern: Stress - Stress Concern Present (08/12/2022)   [employer] of Occupational Health - Occupational Stress Questionnaire    Feeling of Stress : Very much  Social Connections: Moderately Isolated (08/30/2022)   Social Connection and Isolation Panel [NHANES]    Frequency of Communication with Friends and Family: Once a week    Frequency of Social Gatherings with Friends and Family: Once a week    Attends Religious Services: 1 to 4 times per year    Active Member of [employer] or Organizations: Yes    Attends Banker Meetings: 1 to 4 times per year    Marital Status: Widowed      Allergies:   Allergies  Allergen Reactions   Keflex [Cephalexin] Rash and Other (See Comments)    Has tolerated amoxicillin since had keflex   Shellfish Allergy Shortness Of Breath, Nausea And Vomiting and Other (See Comments)    Stomach cramps   Tetracyclines & Related Anaphylaxis, Swelling and  Other  (See Comments)    Throat swelling requiring hospitalization   Dilaudid [Hydromorphone Hcl] Other (See Comments)    "Lethargy"   Prednisone Other (See Comments)    "counter reacts", has tolerated IV medrol   Clindamycin/Lincomycin Other (See Comments)    Burning sensation in her mouth down her throat    Incruse Ellipta [Umeclidinium Bromide] Nausea Only   Molds & Smuts Itching   Tuberculin Tests Other (See Comments)    False postive   Umeclidinium     Other Reaction(s): GI Intolerance    Metabolic Disorder Labs: Lab Results  Component Value Date   HGBA1C 6.8 (H) 09/08/2022   MPG 148 09/08/2022   MPG 154 02/08/2022   No results found for: "PROLACTIN" Lab Results  Component Value Date   CHOL 140 06/11/2021   TRIG 98.0 06/11/2021   HDL 51.10 06/11/2021   CHOLHDL 3 06/11/2021   VLDL 19.6 06/11/2021   LDLCALC 69 06/11/2021   LDLCALC 58 03/07/2020   Lab Results  Component Value Date   TSH 0.93 06/11/2021    Therapeutic Level Labs: No results found for: "LITHIUM" No results found for: "CBMZ" No results found for: "VALPROATE"  Current Medications: Current Outpatient Medications  Medication Sig Dispense Refill   ACCU-CHEK GUIDE test strip USE TO CHECK BLOOD SUGAR IN THE  MORNING, AT NOON, AND AT BEDTIME 100 strip 9   amLODipine (NORVASC) 5 MG tablet Take 1 tablet (5 mg total) by mouth daily. 30 tablet 2   atenolol (TENORMIN) 25 MG tablet TAKE 1 TABLET BY MOUTH DAILY 100 tablet 2   atorvastatin (LIPITOR) 20 MG tablet TAKE 1 TABLET BY MOUTH ONCE  DAILY 100 tablet 2   buPROPion (WELLBUTRIN XL) 300 MG 24 hr tablet TAKE 1 TABLET BY MOUTH DAILY 90 tablet 0   diazepam (VALIUM) 5 MG tablet Take 1 tablet (5 mg total) by mouth every 6 (six) hours as needed for anxiety.     fluticasone (FLONASE) 50 MCG/ACT nasal spray Place 2 sprays into both nostrils daily as needed for allergies.     guaiFENesin-Codeine 225-7.5 MG/5ML LIQD Take 5 mLs by mouth 3 (three) times daily as needed. 473 mL  0   Ipratropium-Albuterol (COMBIVENT RESPIMAT) 20-100 MCG/ACT AERS respimat Inhale 1 puff into the lungs every 6 (six) hours as needed for wheezing or shortness of breath. 12 g 3   levothyroxine (SYNTHROID) 25 MCG tablet TAKE 1 TABLET BY MOUTH DAILY  BEFORE BREAKFAST (Patient taking differently: Take 25 mcg by mouth daily before breakfast.) 100 tablet 2   MELATONIN PO Take 5 mg by mouth at bedtime as needed (sleep).     montelukast (SINGULAIR) 10 MG tablet TAKE 1 TABLET BY MOUTH AT  BEDTIME (Patient taking differently: Take 10 mg by mouth at bedtime.) 100 tablet 2   naproxen (NAPROSYN) 500 MG tablet Take 1 tablet (500 mg total) by mouth 2 (two) times daily with a meal. (Patient taking differently: Take 500 mg by mouth daily as needed for mild pain or headache.) 180 tablet 3   OXYGEN Inhale 3 L/min into the lungs See admin instructions. Inhale  3 L/min of oxygen into the lungs w/CPAP at bedtime and as needed for shortness of breath with "strenuous activity" during the day     pantoprazole (PROTONIX) 40 MG tablet TAKE 1 TABLET BY MOUTH DAILY 100 tablet 2   pregabalin (LYRICA) 300 MG capsule TAKE 1 CAPSULE BY MOUTH TWICE  DAILY 60 capsule 3   QUEtiapine (SEROQUEL) 100 MG  tablet TAKE 1 TABLET BY MOUTH AT  BEDTIME 30 tablet 11   roflumilast (DALIRESP) 500 MCG TABS tablet Take 1 tablet (500 mcg total) by mouth daily. 90 tablet 3   silver sulfADIAZINE (SILVADENE) 1 % cream Apply 1 Application topically daily. 25 g 1   venlafaxine XR (EFFEXOR-XR) 150 MG 24 hr capsule TAKE 1 CAPSULE BY MOUTH DAILY  WITH BREAKFAST 100 capsule 2   witch hazel-glycerin (TUCKS) pad Apply topically as needed for itching. (Patient taking differently: Apply 1 Application topically daily as needed for itching.)     No current facility-administered medications for this visit.     Psychiatric Specialty Exam: Review of Systems  Cardiovascular:  Negative for chest pain.  Psychiatric/Behavioral:  Negative for substance abuse and  suicidal ideas. The patient has insomnia.     There were no vitals taken for this visit.There is no height or weight on file to calculate BMI.  General Appearance:casual  Eye Contact: fair  Speech:  Slow  Volume:  Decreased  Mood: fair  Affect:  congruent  Thought Process:  Goal Directed  Orientation:  Full (Time, Place, and Person)  Thought Content: somewhat paranoid   Suicidal Thoughts:  No  Homicidal Thoughts:  No  Memory:  Immediate;   Fair Recent;   Fair  Judgement:  Fair  Insight:  Shallow  Psychomotor Activity:  Decreased  Concentration:  Concentration: Fair and Attention Span: Fair  Recall:  [employer] of Knowledge:Fair  Language: Fair  Akathisia:  No  Handed:    AIMS (if indicated): no involuntary movements  Assets:  Desire for Improvement Leisure Time Social Support  ADL's: limited due to recent surgery  Cognition: WNL  Sleep:   variable   Screenings: Mini-Mental    Flowsheet Row Clinical Support from 04/06/2018 in [facility]  Total Score (max 30 points ) 29      PHQ2-9    Flowsheet Row Clinical Support from 08/30/2022 in [facility] [place] Office Visit from 08/13/2022 in [facility] Office Visit from 10/12/2021 in [facility] Clinical Support from 08/27/2021 in [facility] at [facility] Chronic Care Management from 05/04/2021 in [facility] at [facility]  PHQ-2 Total Score 0 0 2 0 1  PHQ-9 Total Score -- -- 2 -- --      Flowsheet Row ED to Hosp-Admission (Discharged) from 12/05/2022 in [facility] ED to Hosp-Admission (Discharged) from 10/09/2022 in [facility] ED from 09/13/2022 in [facility] Emergency Department at [facility]  C-SSRS RISK CATEGORY No Risk No Risk No Risk       Assessment and Plan: as follows  Prior  documentation reviewed   Mood disorder unspecified: relavant to multiple medical concerns and past history of using drugs,  Mood is fair gets edgy without proper sleep , wants to re instate trazadone, discussed to start 50mg , not 100mg  . She has the 100mg  can use half of it avoid during the day time napping Also limit valium to night time only . States that's how she use and not during the day to avoid sedation Follow with sleep clinic or neurology to assess CPap machine  Seroquel at night for mood and paranoia   PTSD: baseline, continue effexor  Insomnia: reveiewed sleep hygiene, see above in regard to meds and cautioned its use Anxiety ; flucutates  conitnue effexor  Call for refills when due  Fu 66m.   Thresa Ross, MD 10/21/20243:16 PM

## 2022-12-20 NOTE — Plan of Care (Signed)
 [facility] Tonsillectomy/Adenoidectomy, Postoperative PEDS care plan entered in error.

## 2023-01-08 ENCOUNTER — Other Ambulatory Visit (HOSPITAL_BASED_OUTPATIENT_CLINIC_OR_DEPARTMENT_OTHER): Payer: Self-pay

## 2023-01-08 MED ORDER — BREZTRI AEROSPHERE 160-9-4.8 MCG/ACT IN AERO: 2.0000 | INHALATION_SPRAY | Freq: Two times a day (BID) | RESPIRATORY_TRACT | 3 refills | Status: DC

## 2023-01-29 ENCOUNTER — Other Ambulatory Visit: Payer: Self-pay

## 2023-01-29 ENCOUNTER — Inpatient Hospital Stay (HOSPITAL_COMMUNITY)
Admission: EM | Admit: 2023-01-29 | Discharge: 2023-02-02 | DRG: 194 | Disposition: A | Discharge status: Home / Self Care | Attending: Family Medicine | Admitting: Family Medicine

## 2023-01-29 ENCOUNTER — Emergency Department (HOSPITAL_COMMUNITY): Payer: Medicare Other

## 2023-01-29 ENCOUNTER — Encounter (HOSPITAL_COMMUNITY): Payer: Self-pay

## 2023-01-29 ENCOUNTER — Emergency Department (HOSPITAL_COMMUNITY)

## 2023-01-29 DIAGNOSIS — Z555 Less than a high school diploma: Secondary | ICD-10-CM

## 2023-01-29 DIAGNOSIS — K219 Gastro-esophageal reflux disease without esophagitis: Secondary | ICD-10-CM | POA: Diagnosis present

## 2023-01-29 DIAGNOSIS — Z9049 Acquired absence of other specified parts of digestive tract: Secondary | ICD-10-CM

## 2023-01-29 DIAGNOSIS — I10 Essential (primary) hypertension: Secondary | ICD-10-CM | POA: Diagnosis present

## 2023-01-29 DIAGNOSIS — I499 Cardiac arrhythmia, unspecified: Secondary | ICD-10-CM | POA: Diagnosis not present

## 2023-01-29 DIAGNOSIS — F17200 Nicotine dependence, unspecified, uncomplicated: Secondary | ICD-10-CM | POA: Diagnosis present

## 2023-01-29 DIAGNOSIS — W19XXXA Unspecified fall, initial encounter: Secondary | ICD-10-CM | POA: Diagnosis present

## 2023-01-29 DIAGNOSIS — F319 Bipolar disorder, unspecified: Secondary | ICD-10-CM | POA: Diagnosis present

## 2023-01-29 DIAGNOSIS — Z66 Do not resuscitate: Secondary | ICD-10-CM | POA: Diagnosis not present

## 2023-01-29 DIAGNOSIS — F419 Anxiety disorder, unspecified: Secondary | ICD-10-CM | POA: Diagnosis present

## 2023-01-29 DIAGNOSIS — Z9981 Dependence on supplemental oxygen: Secondary | ICD-10-CM | POA: Diagnosis not present

## 2023-01-29 DIAGNOSIS — J432 Centrilobular emphysema: Secondary | ICD-10-CM | POA: Diagnosis not present

## 2023-01-29 DIAGNOSIS — Z91013 Allergy to seafood: Secondary | ICD-10-CM

## 2023-01-29 DIAGNOSIS — J383 Other diseases of vocal cords: Secondary | ICD-10-CM | POA: Diagnosis present

## 2023-01-29 DIAGNOSIS — F4310 Post-traumatic stress disorder, unspecified: Secondary | ICD-10-CM | POA: Diagnosis present

## 2023-01-29 DIAGNOSIS — I2510 Atherosclerotic heart disease of native coronary artery without angina pectoris: Secondary | ICD-10-CM | POA: Diagnosis not present

## 2023-01-29 DIAGNOSIS — Z96643 Presence of artificial hip joint, bilateral: Secondary | ICD-10-CM | POA: Diagnosis present

## 2023-01-29 DIAGNOSIS — J439 Emphysema, unspecified: Secondary | ICD-10-CM | POA: Diagnosis present

## 2023-01-29 DIAGNOSIS — Z888 Allergy status to other drugs, medicaments and biological substances status: Secondary | ICD-10-CM

## 2023-01-29 DIAGNOSIS — F17210 Nicotine dependence, cigarettes, uncomplicated: Secondary | ICD-10-CM | POA: Diagnosis present

## 2023-01-29 DIAGNOSIS — E1140 Type 2 diabetes mellitus with diabetic neuropathy, unspecified: Secondary | ICD-10-CM | POA: Diagnosis present

## 2023-01-29 DIAGNOSIS — Z8249 Family history of ischemic heart disease and other diseases of the circulatory system: Secondary | ICD-10-CM

## 2023-01-29 DIAGNOSIS — R339 Retention of urine, unspecified: Secondary | ICD-10-CM | POA: Diagnosis present

## 2023-01-29 DIAGNOSIS — R0600 Dyspnea, unspecified: Secondary | ICD-10-CM | POA: Diagnosis not present

## 2023-01-29 DIAGNOSIS — G4733 Obstructive sleep apnea (adult) (pediatric): Secondary | ICD-10-CM | POA: Diagnosis present

## 2023-01-29 DIAGNOSIS — Z981 Arthrodesis status: Secondary | ICD-10-CM

## 2023-01-29 DIAGNOSIS — Y92009 Unspecified place in unspecified non-institutional (private) residence as the place of occurrence of the external cause: Secondary | ICD-10-CM | POA: Diagnosis not present

## 2023-01-29 DIAGNOSIS — J9611 Chronic respiratory failure with hypoxia: Secondary | ICD-10-CM | POA: Diagnosis present

## 2023-01-29 DIAGNOSIS — J449 Chronic obstructive pulmonary disease, unspecified: Secondary | ICD-10-CM | POA: Diagnosis present

## 2023-01-29 DIAGNOSIS — Z7951 Long term (current) use of inhaled steroids: Secondary | ICD-10-CM

## 2023-01-29 DIAGNOSIS — J441 Chronic obstructive pulmonary disease with (acute) exacerbation: Principal | ICD-10-CM | POA: Diagnosis present

## 2023-01-29 DIAGNOSIS — Z743 Need for continuous supervision: Secondary | ICD-10-CM | POA: Diagnosis not present

## 2023-01-29 DIAGNOSIS — Z5986 Financial insecurity: Secondary | ICD-10-CM

## 2023-01-29 DIAGNOSIS — Z515 Encounter for palliative care: Secondary | ICD-10-CM | POA: Diagnosis not present

## 2023-01-29 DIAGNOSIS — E039 Hypothyroidism, unspecified: Secondary | ICD-10-CM | POA: Diagnosis present

## 2023-01-29 DIAGNOSIS — Z9851 Tubal ligation status: Secondary | ICD-10-CM

## 2023-01-29 DIAGNOSIS — R6889 Other general symptoms and signs: Secondary | ICD-10-CM | POA: Diagnosis not present

## 2023-01-29 DIAGNOSIS — Z7189 Other specified counseling: Secondary | ICD-10-CM | POA: Diagnosis not present

## 2023-01-29 DIAGNOSIS — Z7401 Bed confinement status: Secondary | ICD-10-CM | POA: Diagnosis not present

## 2023-01-29 DIAGNOSIS — Z885 Allergy status to narcotic agent status: Secondary | ICD-10-CM

## 2023-01-29 DIAGNOSIS — R079 Chest pain, unspecified: Secondary | ICD-10-CM | POA: Diagnosis not present

## 2023-01-29 DIAGNOSIS — E782 Mixed hyperlipidemia: Secondary | ICD-10-CM | POA: Diagnosis present

## 2023-01-29 DIAGNOSIS — T380X5A Adverse effect of glucocorticoids and synthetic analogues, initial encounter: Secondary | ICD-10-CM | POA: Diagnosis not present

## 2023-01-29 DIAGNOSIS — J189 Pneumonia, unspecified organism: Principal | ICD-10-CM | POA: Diagnosis present

## 2023-01-29 DIAGNOSIS — Z887 Allergy status to serum and vaccine status: Secondary | ICD-10-CM

## 2023-01-29 DIAGNOSIS — Z881 Allergy status to other antibiotic agents status: Secondary | ICD-10-CM

## 2023-01-29 DIAGNOSIS — Z79899 Other long term (current) drug therapy: Secondary | ICD-10-CM

## 2023-01-29 DIAGNOSIS — J440 Chronic obstructive pulmonary disease with acute lower respiratory infection: Secondary | ICD-10-CM | POA: Diagnosis present

## 2023-01-29 DIAGNOSIS — E1165 Type 2 diabetes mellitus with hyperglycemia: Secondary | ICD-10-CM | POA: Diagnosis not present

## 2023-01-29 DIAGNOSIS — R918 Other nonspecific abnormal finding of lung field: Secondary | ICD-10-CM | POA: Diagnosis not present

## 2023-01-29 DIAGNOSIS — Z79890 Hormone replacement therapy (postmenopausal): Secondary | ICD-10-CM

## 2023-01-29 LAB — URINALYSIS, ROUTINE W REFLEX MICROSCOPIC
Bilirubin Urine: NEGATIVE
Glucose, UA: NEGATIVE mg/dL
Ketones, ur: NEGATIVE mg/dL
Leukocytes,Ua: NEGATIVE
Nitrite: NEGATIVE
Protein, ur: 30 mg/dL — AB
Specific Gravity, Urine: >1.046 — ABNORMAL HIGH (ref 1.005–1.030)
pH: 7 (ref 5.0–8.0)

## 2023-01-29 LAB — CBC WITH DIFFERENTIAL/PLATELET
Abs Immature Granulocytes: 0.58 10*3/uL — ABNORMAL HIGH (ref 0.00–0.07)
Basophils Absolute: 0.1 10*3/uL (ref 0.0–0.1)
Basophils Relative: 0 %
Eosinophils Absolute: 0.1 10*3/uL (ref 0.0–0.5)
Eosinophils Relative: 0 %
HCT: 37.8 % (ref 36.0–46.0)
Hemoglobin: 12.3 g/dL (ref 12.0–15.0)
Immature Granulocytes: 2 %
Lymphocytes Relative: 4 %
Lymphs Abs: 1.1 10*3/uL (ref 0.7–4.0)
MCH: 29.2 pg (ref 26.0–34.0)
MCHC: 32.5 g/dL (ref 30.0–36.0)
MCV: 89.8 fL (ref 80.0–100.0)
Monocytes Absolute: 1.4 10*3/uL — ABNORMAL HIGH (ref 0.1–1.0)
Monocytes Relative: 5 %
Neutro Abs: 23.3 10*3/uL — ABNORMAL HIGH (ref 1.7–7.7)
Neutrophils Relative %: 89 %
Platelets: 218 10*3/uL (ref 150–400)
RBC: 4.21 MIL/uL (ref 3.87–5.11)
RDW: 15.1 % (ref 11.5–15.5)
WBC: 26.5 10*3/uL — ABNORMAL HIGH (ref 4.0–10.5)
nRBC: 0 % (ref 0.0–0.2)

## 2023-01-29 LAB — COMPREHENSIVE METABOLIC PANEL
ALT: 14 U/L (ref 0–44)
AST: 12 U/L — ABNORMAL LOW (ref 15–41)
Albumin: 3.6 g/dL (ref 3.5–5.0)
Alkaline Phosphatase: 107 U/L (ref 38–126)
Anion gap: 8 (ref 5–15)
BUN: 9 mg/dL (ref 8–23)
CO2: 33 mmol/L — ABNORMAL HIGH (ref 22–32)
Calcium: 9.3 mg/dL (ref 8.9–10.3)
Chloride: 94 mmol/L — ABNORMAL LOW (ref 98–111)
Creatinine, Ser: 0.74 mg/dL (ref 0.44–1.00)
GFR, Estimated: >60 mL/min (ref >60)
Glucose, Bld: 148 mg/dL — ABNORMAL HIGH (ref 70–99)
Potassium: 3.5 mmol/L (ref 3.5–5.1)
Sodium: 135 mmol/L (ref 135–145)
Total Bilirubin: 0.8 mg/dL (ref <1.2)
Total Protein: 6.9 g/dL (ref 6.5–8.1)

## 2023-01-29 LAB — STREP PNEUMONIAE URINARY ANTIGEN: Strep Pneumo Urinary Antigen: NEGATIVE

## 2023-01-29 LAB — TROPONIN I (HIGH SENSITIVITY)
Troponin I (High Sensitivity): 5 ng/L (ref <18)
Troponin I (High Sensitivity): 4 ng/L (ref <18)

## 2023-01-29 LAB — BRAIN NATRIURETIC PEPTIDE: B Natriuretic Peptide: 128 pg/mL — ABNORMAL HIGH (ref 0.0–100.0)

## 2023-01-29 MED ORDER — FAMOTIDINE 20 MG PO TABS
20.0000 mg | ORAL_TABLET | Freq: Every day | ORAL | Status: DC
  Administered 2023-01-30 – 2023-02-02 (×4): 20 mg via ORAL
  Filled 2023-01-29 (×4): qty 1

## 2023-01-29 MED ORDER — SODIUM CHLORIDE 0.9 % IV SOLN
2.0000 g | Freq: Once | INTRAVENOUS | Status: AC
  Administered 2023-01-29: 2 g via INTRAVENOUS
  Filled 2023-01-29: qty 20

## 2023-01-29 MED ORDER — QUETIAPINE FUMARATE 100 MG PO TABS
100.0000 mg | ORAL_TABLET | Freq: Every day | ORAL | Status: DC
Start: 2023-01-29 — End: 2023-02-02
  Administered 2023-01-29 – 2023-02-01 (×4): 100 mg via ORAL
  Filled 2023-01-29 (×3): qty 1
  Filled 2023-01-29: qty 4

## 2023-01-29 MED ORDER — IPRATROPIUM-ALBUTEROL 0.5-2.5 (3) MG/3ML IN SOLN
9.0000 mL | Freq: Once | RESPIRATORY_TRACT | Status: AC
  Administered 2023-01-29: 9 mL via RESPIRATORY_TRACT
  Filled 2023-01-29: qty 3

## 2023-01-29 MED ORDER — IOHEXOL 350 MG/ML SOLN
75.0000 mL | Freq: Once | INTRAVENOUS | Status: AC | PRN
  Administered 2023-01-29: 75 mL via INTRAVENOUS

## 2023-01-29 MED ORDER — UMECLIDINIUM BROMIDE 62.5 MCG/ACT IN AEPB
1.0000 | INHALATION_SPRAY | Freq: Every day | RESPIRATORY_TRACT | Status: DC
  Administered 2023-01-30 – 2023-02-02 (×4): 1 via RESPIRATORY_TRACT
  Filled 2023-01-29: qty 7

## 2023-01-29 MED ORDER — ATORVASTATIN CALCIUM 10 MG PO TABS: 20.0000 mg | ORAL_TABLET | Freq: Every day | ORAL | Status: DC

## 2023-01-29 MED ORDER — HYDROCHLOROTHIAZIDE 25 MG PO TABS: 25.0000 mg | ORAL_TABLET | Freq: Every day | ORAL | Status: DC

## 2023-01-29 MED ORDER — MELATONIN 3 MG PO TABS: 3.0000 mg | ORAL_TABLET | Freq: Every evening | ORAL | Status: DC | PRN

## 2023-01-29 MED ORDER — LEVOTHYROXINE SODIUM 50 MCG PO TABS
25.0000 ug | ORAL_TABLET | Freq: Every day | ORAL | Status: DC
  Administered 2023-01-30 – 2023-02-02 (×4): 25 ug via ORAL
  Filled 2023-01-29 (×4): qty 1

## 2023-01-29 MED ORDER — SODIUM CHLORIDE 0.9 % IV SOLN
2.0000 g | INTRAVENOUS | Status: DC
  Administered 2023-01-30 – 2023-01-31 (×2): 2 g via INTRAVENOUS
  Filled 2023-01-29 (×2): qty 20

## 2023-01-29 MED ORDER — ATENOLOL 25 MG PO TABS: 25.0000 mg | ORAL_TABLET | Freq: Every day | ORAL | Status: DC

## 2023-01-29 MED ORDER — PREGABALIN 75 MG PO CAPS
300.0000 mg | ORAL_CAPSULE | Freq: Two times a day (BID) | ORAL | Status: DC
  Administered 2023-01-29 – 2023-02-02 (×8): 300 mg via ORAL
  Filled 2023-01-29 (×8): qty 4

## 2023-01-29 MED ORDER — METHYLPREDNISOLONE SODIUM SUCC 125 MG IJ SOLR
125.0000 mg | Freq: Once | INTRAMUSCULAR | Status: AC
  Administered 2023-01-29: 125 mg via INTRAVENOUS
  Filled 2023-01-29: qty 2

## 2023-01-29 MED ORDER — METHYLPREDNISOLONE SODIUM SUCC 125 MG IJ SOLR
80.0000 mg | Freq: Two times a day (BID) | INTRAMUSCULAR | Status: DC
  Administered 2023-01-29 – 2023-02-01 (×5): 80 mg via INTRAVENOUS
  Filled 2023-01-29 (×5): qty 2

## 2023-01-29 MED ORDER — NICOTINE 21 MG/24HR TD PT24
21.0000 mg | MEDICATED_PATCH | Freq: Every day | TRANSDERMAL | Status: DC
  Administered 2023-01-30: 21 mg via TRANSDERMAL
  Filled 2023-01-29 (×3): qty 1

## 2023-01-29 MED ORDER — SODIUM CHLORIDE 0.9 % IV SOLN
500.0000 mg | INTRAVENOUS | Status: DC
  Administered 2023-01-29: 500 mg via INTRAVENOUS
  Filled 2023-01-29: qty 5

## 2023-01-29 MED ORDER — BUDESON-GLYCOPYRROL-FORMOTEROL 160-9-4.8 MCG/ACT IN AERO
2.0000 | INHALATION_SPRAY | Freq: Two times a day (BID) | RESPIRATORY_TRACT | Status: DC
Start: 2023-01-29 — End: 2023-01-29

## 2023-01-29 MED ORDER — VENLAFAXINE HCL ER 37.5 MG PO CP24
150.0000 mg | ORAL_CAPSULE | Freq: Every day | ORAL | Status: DC
  Administered 2023-01-30 – 2023-02-02 (×4): 150 mg via ORAL
  Filled 2023-01-29 (×5): qty 4

## 2023-01-29 MED ORDER — ALBUTEROL SULFATE (2.5 MG/3ML) 0.083% IN NEBU
2.5000 mg | INHALATION_SOLUTION | RESPIRATORY_TRACT | Status: DC | PRN
  Administered 2023-01-29 (×2): 2.5 mg via RESPIRATORY_TRACT
  Filled 2023-01-29 (×3): qty 3

## 2023-01-29 MED ORDER — ATENOLOL 25 MG PO TABS
25.0000 mg | ORAL_TABLET | Freq: Every day | ORAL | Status: DC
  Administered 2023-01-30 – 2023-02-02 (×4): 25 mg via ORAL
  Filled 2023-01-29 (×4): qty 1

## 2023-01-29 MED ORDER — HYDRALAZINE HCL 20 MG/ML IJ SOLN
5.0000 mg | INTRAMUSCULAR | Status: DC | PRN
  Administered 2023-01-29: 5 mg via INTRAVENOUS
  Filled 2023-01-29: qty 1

## 2023-01-29 MED ORDER — MOMETASONE FURO-FORMOTEROL FUM 100-5 MCG/ACT IN AERO
2.0000 | INHALATION_SPRAY | Freq: Two times a day (BID) | RESPIRATORY_TRACT | Status: DC
  Administered 2023-01-29 – 2023-02-02 (×8): 2 via RESPIRATORY_TRACT
  Filled 2023-01-29: qty 8.8

## 2023-01-29 MED ORDER — ATORVASTATIN CALCIUM 10 MG PO TABS
20.0000 mg | ORAL_TABLET | Freq: Every day | ORAL | Status: DC
  Administered 2023-01-30 – 2023-02-02 (×4): 20 mg via ORAL
  Filled 2023-01-29 (×4): qty 2

## 2023-01-29 MED ORDER — MONTELUKAST SODIUM 10 MG PO TABS
10.0000 mg | ORAL_TABLET | Freq: Every day | ORAL | Status: DC
  Administered 2023-01-29 – 2023-02-01 (×4): 10 mg via ORAL
  Filled 2023-01-29 (×4): qty 1

## 2023-01-29 MED ORDER — SODIUM CHLORIDE 0.9 % IV SOLN: 500.0000 mg | INTRAVENOUS | Status: DC

## 2023-01-29 MED ORDER — DIAZEPAM 5 MG PO TABS
5.0000 mg | ORAL_TABLET | Freq: Four times a day (QID) | ORAL | Status: DC | PRN
  Administered 2023-01-29 – 2023-02-02 (×4): 5 mg via ORAL
  Filled 2023-01-29 (×4): qty 1

## 2023-01-29 MED ORDER — HYDROCHLOROTHIAZIDE 25 MG PO TABS
25.0000 mg | ORAL_TABLET | Freq: Every day | ORAL | Status: DC
  Administered 2023-01-30 – 2023-01-31 (×2): 25 mg via ORAL
  Filled 2023-01-29 (×2): qty 1

## 2023-01-29 MED ORDER — ENOXAPARIN SODIUM 40 MG/0.4ML IJ SOSY
40.0000 mg | PREFILLED_SYRINGE | INTRAMUSCULAR | Status: DC
  Administered 2023-01-30 – 2023-02-02 (×4): 40 mg via SUBCUTANEOUS
  Filled 2023-01-29 (×4): qty 0.4

## 2023-01-29 MED ORDER — PANTOPRAZOLE SODIUM 40 MG PO TBEC
40.0000 mg | DELAYED_RELEASE_TABLET | Freq: Every day | ORAL | Status: DC
  Administered 2023-01-30 – 2023-02-02 (×4): 40 mg via ORAL
  Filled 2023-01-29 (×4): qty 1

## 2023-01-29 NOTE — H&P (Signed)
TRH H&P   Patient Demographics:    Joan [name], is a 62 y.o. female  MRN: [id number]   DOB - May 13, 1960  Admit Date - 01/29/2023  Outpatient Primary MD for the patient is Myrlene Broker, MD  Referring MD/NP/PA: Dr. Lynelle Doctor   Patient coming from: Home  Chief Complaint  Patient presents with   Shortness of Breath   Chest Pain      HPI:    Joan [name]  is a 62 y.o. female,  with medical history significant of terminal COPD on hospice care, hypertension, hyperlipidemia, T2DM, OSA on CPAP who presented to the emergency department secondary to complaints of shortness of breath and chest pain, patient reports pleuritic chest pain, in the right chest area, worsened by deep breathing, and activity, as well she does report progressive dyspnea, no nausea, no vomiting, no headache, no fever, she does report cough at baseline, as well reports hoarseness in her voice at baseline, she denies any sick contact, reports has been feeling worse over last 24 hours which prompted her to come to ED, she is on 3.5 L nasal cannula at baseline, reports she is still smoking. -In ED her EKG with no acute findings, troponins negative x 2, CTA chest was negative for PE but with evidence of pneumonia, she had leukocytosis at 26K, but lactic acid within normal limit, she had  wheezing on presentation, she was started on IV steroids, IV antibiotics, she had urinary retention with bladder scan more than 450 cc, [facility] consulted to admit.   Review of systems:     A full 10 point Review of Systems was done, except as stated above, all other Review of Systems were negative.   With Past History of the following :    Past Medical History:  Diagnosis Date   Acute respiratory failure with hypoxia (HCC)    Alcoholism and drug addiction in family    Anxiety    Appendicitis    Arthritis    "knees,  hips, lower back" (09/25/2016)   Asthma    Bipolar disorder (HCC)    Chronic bronchitis (HCC)    "all the time" (09/25/2016)   Chronic leg pain    RLE   Chronic lower back pain    COPD (chronic obstructive pulmonary disease) (HCC)    Depression    Diabetes mellitus without complication (HCC)    Diabetic neuropathy (HCC)    Early cataracts, bilateral    Emphysema of lung (HCC)    Generalized anxiety disorder 02/09/2020   GERD (gastroesophageal reflux disease)    Headache    Hepatitis C    "treated back in 2001-2002"   High cholesterol    Hypertension    Hypothyroidism    Incarcerated ventral hernia 04/04/2017   Mood disorder in conditions classified elsewhere 02/09/2020   On home oxygen therapy    "2L; at nighttime" (04/04/2017)  OSA on CPAP    CPAP with Oxygen 3 liters   Paranoid (HCC)    Pneumonia    "all the time" (09/25/2016)   Positive PPD    with negative chest x ray   PTSD (post-traumatic stress disorder) 02/09/2020   Tachycardia    patient reports with exertion ; denies any other conditions    Thyroid disease    Wears glasses    Wears partial dentures       Past Surgical History:  Procedure Laterality Date   ANTERIOR CERVICAL DECOMP/DISCECTOMY FUSION N/A 02/14/2022   Procedure: Anterior Cervical Decompression/Discectomy Fusion Cervical Three-Cervical Four;  Surgeon: Bedelia Person, MD;  Location: [facility] OR;  Service: Neurosurgery;  Laterality: N/A;  3C   APPENDECTOMY     BACK SURGERY     BIOPSY  11/24/2018   Procedure: BIOPSY;  Surgeon: Tressia Danas, MD;  Location: [facility];  Service: Gastroenterology;;   CARDIAC CATHETERIZATION     COLONOSCOPY     COLONOSCOPY WITH PROPOFOL N/A 11/24/2018   Procedure: COLONOSCOPY WITH PROPOFOL;  Surgeon: Tressia Danas, MD;  Location: [facility];  Service: Gastroenterology;  Laterality: N/A;   CYST EXCISION     removal of cyst in sinuses   DILATION AND CURETTAGE OF UTERUS     ESOPHAGOGASTRODUODENOSCOPY (EGD) WITH  PROPOFOL N/A 11/24/2018   Procedure: ESOPHAGOGASTRODUODENOSCOPY (EGD) WITH PROPOFOL;  Surgeon: Tressia Danas, MD;  Location: [facility];  Service: Gastroenterology;  Laterality: N/A;   FOOT SURGERY Bilateral    bone removed from 4th and 5 th toes and put in pin then took pin out   HERNIA REPAIR     INCISION AND DRAINAGE  ~ 2014/2015   "removed mesh & infection"   INCISIONAL HERNIA REPAIR N/A 04/04/2017   Procedure: LAPAROSCOPIC INCISIONAL HERNIA REPAIR WITH MESH;  Surgeon: Claud Kelp, MD;  Location: [facility] OR;  Service: General;  Laterality: N/A;   LACERATION REPAIR Right    repair wrist artery from laceration from ice skate   LAPAROSCOPIC APPENDECTOMY N/A 03/31/2019   Procedure: APPENDECTOMY LAPAROSCOPIC;  Surgeon: Axel Filler, MD;  Location: [facility];  Service: General;  Laterality: N/A;   LAPAROSCOPIC APPENDECTOMY N/A 07/09/2019   Procedure: APPENDECTOMY LAPAROSCOPIC;  Surgeon: Axel Filler, MD;  Location: [facility] OR;  Service: General;  Laterality: N/A;   LAPAROSCOPIC INCISIONAL / UMBILICAL / VENTRAL HERNIA REPAIR  04/04/2017   LAPAROSCOPIC INCISIONAL HERNIA REPAIR WITH MESH   LIPOMA EXCISION Right    fattty lipoma in neck   NASAL SEPTUM SURGERY     POSTERIOR LUMBAR FUSION  2015/2016 X 2   "added rods and screws"   SINOSCOPY     TOTAL HIP ARTHROPLASTY Right 02/05/2017   Procedure: RIGHT TOTAL HIP ARTHROPLASTY;  Surgeon: Ranee Gosselin, MD;  Location: [facility];  Service: Orthopedics;  Laterality: Right;   TOTAL HIP ARTHROPLASTY Left 06/18/2017   Procedure: LEFT TOTAL HIP ARTHROPLASTY;  Surgeon: Ranee Gosselin, MD;  Location: [facility];  Service: Orthopedics;  Laterality: Left;   TOTAL HIP REVISION Left 01/26/2019   Procedure: TOTAL HIP REVISION;  Surgeon: Durene Romans, MD;  Location: [facility];  Service: Orthopedics;  Laterality: Left;  2 hrs   TUBAL LIGATION     UMBILICAL HERNIA REPAIR  ~ 2013   w/mesh   UPPER GI ENDOSCOPY        Social History:     Social History    Tobacco Use   Smoking status: Former    Current packs/day: 2.00    Average packs/day: 2.0 packs/day  for 47.9 years (95.8 ttl pk-yrs)    Types: Cigarettes    Start date: 1977    Passive exposure: Current   Smokeless tobacco: Never   Tobacco comments:    1ppd as of 07/30/22 ep  Substance Use Topics   Alcohol use: Not Currently       Family History :     Family History  Problem Relation Age of Onset   Diabetes Mother    Hypertension Mother    Hypertension Father    Cancer Father        lung   Breast cancer Neg Hx      Home Medications:   Prior to Admission medications   Medication Sig Start Date End Date Taking? Authorizing Provider  Budeson-Glycopyrrol-Formoterol (BREZTRI AEROSPHERE) 160-9-4.8 MCG/ACT AERO Inhale 2 puffs into the lungs in the morning and at bedtime. 01/08/23  Yes Oretha Milch, MD  diazepam (VALIUM) 5 MG tablet Take 1 tablet (5 mg total) by mouth every 6 (six) hours as needed for anxiety. 12/07/22  Yes Johnson, Clanford L, MD  famotidine (PEPCID) 20 MG tablet Take 20 mg by mouth daily. 09/06/22  Yes [provider]  furosemide (LASIX) 20 MG tablet Take 20 mg by mouth daily as needed. 09/16/22  Yes [provider]  hydrochlorothiazide (HYDRODIURIL) 25 MG tablet Take 25 mg by mouth daily. 01/01/23  Yes [provider]  Ipratropium-Albuterol (COMBIVENT RESPIMAT) 20-100 MCG/ACT AERS respimat Inhale 1 puff into the lungs every 6 (six) hours as needed for wheezing or shortness of breath. 04/02/22  Yes Bevelyn Ngo, NP  ACCU-CHEK GUIDE test strip USE TO CHECK BLOOD SUGAR IN THE  MORNING, AT NOON, AND AT BEDTIME 12/13/22   Myrlene Broker, MD  amLODipine (NORVASC) 5 MG tablet Take 1 tablet (5 mg total) by mouth daily. 10/11/22 01/09/23  Narda Bonds, MD  atenolol (TENORMIN) 25 MG tablet TAKE 1 TABLET BY MOUTH DAILY 09/18/22   Myrlene Broker, MD  atorvastatin (LIPITOR) 20 MG tablet TAKE 1 TABLET BY MOUTH ONCE  DAILY 07/23/22    Myrlene Broker, MD  buPROPion (WELLBUTRIN XL) 300 MG 24 hr tablet TAKE 1 TABLET BY MOUTH DAILY 06/04/22   Thresa Ross, MD  fluticasone (FLONASE) 50 MCG/ACT nasal spray Place 2 sprays into both nostrils daily as needed for allergies. 12/07/22 03/07/23  Johnson, Clanford L, MD  guaiFENesin-Codeine 225-7.5 MG/5ML LIQD Take 5 mLs by mouth 3 (three) times daily as needed. 07/30/22   Oretha Milch, MD  levothyroxine (SYNTHROID) 25 MCG tablet TAKE 1 TABLET BY MOUTH DAILY  BEFORE BREAKFAST Patient taking differently: Take 25 mcg by mouth daily before breakfast. 07/23/22   Myrlene Broker, MD  MELATONIN PO Take 5 mg by mouth at bedtime as needed (sleep).    [provider]  montelukast (SINGULAIR) 10 MG tablet TAKE 1 TABLET BY MOUTH AT  BEDTIME Patient taking differently: Take 10 mg by mouth at bedtime. 07/23/22   Oretha Milch, MD  naproxen (NAPROSYN) 500 MG tablet Take 1 tablet (500 mg total) by mouth 2 (two) times daily with a meal. Patient taking differently: Take 500 mg by mouth daily as needed for mild pain or headache. 03/29/22   Myrlene Broker, MD  OXYGEN Inhale 3 L/min into the lungs See admin instructions. Inhale  3 L/min of oxygen into the lungs w/CPAP at bedtime and as needed for shortness of breath with "strenuous activity" during the day    [provider]  pantoprazole (PROTONIX) 40 MG tablet TAKE 1 TABLET BY MOUTH DAILY 12/16/22   Myrlene Broker, MD  pregabalin (LYRICA) 300 MG capsule TAKE 1 CAPSULE BY MOUTH TWICE  DAILY 11/25/22   Myrlene Broker, MD  QUEtiapine (SEROQUEL) 100 MG tablet TAKE 1 TABLET BY MOUTH AT  BEDTIME 07/31/22   Thresa Ross, MD  roflumilast (DALIRESP) 500 MCG TABS tablet Take 1 tablet (500 mcg total) by mouth daily. 04/02/22   Bevelyn Ngo, NP  silver sulfADIAZINE (SILVADENE) 1 % cream Apply 1 Application topically daily. 10/09/22   Felecia Shelling, DPM  venlafaxine XR (EFFEXOR-XR) 150 MG 24 hr capsule TAKE 1 CAPSULE BY MOUTH  DAILY  WITH BREAKFAST 12/16/22   Myrlene Broker, MD  witch hazel-glycerin (TUCKS) pad Apply topically as needed for itching. Patient taking differently: Apply 1 Application topically daily as needed for itching. 08/18/20   Standley Brooking, MD     Allergies:     Allergies  Allergen Reactions   Keflex [Cephalexin] Rash and Other (See Comments)    Has tolerated amoxicillin since had keflex   Shellfish Allergy Shortness Of Breath, Nausea And Vomiting and Other (See Comments)    Stomach cramps   Tetracyclines & Related Anaphylaxis, Swelling and Other (See Comments)    Throat swelling requiring hospitalization   Dilaudid [Hydromorphone Hcl] Other (See Comments)    "Lethargy"   Prednisone Other (See Comments)    "counter reacts", has tolerated IV medrol   Clindamycin/Lincomycin Other (See Comments)    Burning sensation in her mouth down her throat    Incruse Ellipta [Umeclidinium Bromide] Nausea Only   Molds & Smuts Itching    "Allergic to everything", pt said she is allergic to most environmental allergens   Tuberculin Tests Other (See Comments)    False postive   Umeclidinium     Other Reaction(s): GI Intolerance     Physical Exam:   Vitals  Blood pressure (!) 150/71, pulse (!) 120, temperature 98.6 F (37 C), temperature source Oral, resp. rate 20, height 5\' 8"  (1.727 m), weight 86.2 kg, SpO2 90%.   1. General Elderly female, laying in some discomfort  2. Normal affect and insight, Not Suicidal or Homicidal, Awake Alert, Oriented X 3.  3. No F.N deficits, ALL C.Nerves Intact, Strength 5/5 all 4 extremities, Sensation intact all 4 extremities, Plantars down going.  4. Ears and Eyes appear Normal, Conjunctivae clear, PERRLA. Moist Oral Mucosa.  5. Supple Neck, No JVD, No cervical lymphadenopathy appriciated, No Carotid Bruits.  6. Symmetrical Chest wall movement, wheezing and Rales bilaterally  7.  Tachycardic, No Gallops, Rubs or Murmurs, No Parasternal  Heave.  8. Positive Bowel Sounds, Abdomen Soft, No tenderness, No organomegaly appriciated,No rebound -guarding or rigidity.  9.  No Cyanosis, Normal Skin Turgor, No Skin Rash or Bruise.  10. Good muscle tone,  joints appear normal , no effusions, Normal ROM.  11. No Palpable Lymph Nodes in Neck or Axillae   Data Review:    CBC Recent Labs  Lab 01/29/23 1421  WBC 26.5*  HGB 12.3  HCT 37.8  PLT 218  MCV 89.8  MCH 29.2  MCHC 32.5  RDW 15.1  LYMPHSABS 1.1  MONOABS 1.4*  EOSABS 0.1  BASOSABS 0.1   ------------------------------------------------------------------------------------------------------------------  Chemistries  Recent Labs  Lab 01/29/23 1421  NA 135  K 3.5  CL 94*  CO2 33*  GLUCOSE 148*  BUN 9  CREATININE 0.74  CALCIUM 9.3  AST 12*  ALT 14  ALKPHOS 107  BILITOT 0.8   ------------------------------------------------------------------------------------------------------------------ estimated creatinine clearance is 84.9 mL/min (by C-G formula based on SCr of 0.74 mg/dL). ------------------------------------------------------------------------------------------------------------------ No results for input(s): "TSH", "T4TOTAL", "T3FREE", "THYROIDAB" in the last 72 hours.  Invalid input(s): "FREET3"  Coagulation profile No results for input(s): "INR", "PROTIME" in the last 168 hours. ------------------------------------------------------------------------------------------------------------------- No results for input(s): "DDIMER" in the last 72 hours. -------------------------------------------------------------------------------------------------------------------  Cardiac Enzymes No results for input(s): "CKMB", "TROPONINI", "MYOGLOBIN" in the last 168 hours.  Invalid input(s): "CK" ------------------------------------------------------------------------------------------------------------------    Component Value Date/Time   BNP 128.0 (H)  01/29/2023 1421     ---------------------------------------------------------------------------------------------------------------  Urinalysis    Component Value Date/Time   COLORURINE YELLOW 12/05/2022 2205   APPEARANCEUR CLEAR 12/05/2022 2205   LABSPEC 1.010 12/05/2022 2205   PHURINE 7.0 12/05/2022 2205   GLUCOSEU NEGATIVE 12/05/2022 2205   GLUCOSEU NEGATIVE 08/05/2017 1652   HGBUR NEGATIVE 12/05/2022 2205   BILIRUBINUR NEGATIVE 12/05/2022 2205   KETONESUR NEGATIVE 12/05/2022 2205   PROTEINUR NEGATIVE 12/05/2022 2205   UROBILINOGEN 0.2 08/05/2017 1652   NITRITE NEGATIVE 12/05/2022 2205   LEUKOCYTESUR NEGATIVE 12/05/2022 2205    ----------------------------------------------------------------------------------------------------------------   Imaging Results:    CT Angio Chest PE W and/or Wo Contrast  Result Date: 01/29/2023 CLINICAL DATA:  Left lower chest pain since yesterday with shortness of breath. EXAM: CT ANGIOGRAPHY CHEST WITH CONTRAST TECHNIQUE: Multidetector CT imaging of the chest was performed using the standard protocol during bolus administration of intravenous contrast. Multiplanar CT image reconstructions and MIPs were obtained to evaluate the vascular anatomy. RADIATION DOSE REDUCTION: This exam was performed according to the departmental dose-optimization program which includes automated exposure control, adjustment of the mA and/or kV according to patient size and/or use of iterative reconstruction technique. CONTRAST:  75mL OMNIPAQUE IOHEXOL 350 MG/ML SOLN COMPARISON:  Chest CT, lung cancer screening, 05/02/2022. FINDINGS: Cardiovascular: Study mildly limited relative decreased contrast opacification of peripheral pulmonary arteries and some motion artifact. Allowing for this, there is no evidence of a pulmonary embolism. Heart is normal in size and configuration. Left coronary artery and circumflex coronary artery calcifications. No pericardial effusion. Great  vessels are normal in caliber. Mild aortic atherosclerosis. No dissection. Arch branch vessels are widely patent. Mediastinum/Nodes: No neck base, mediastinal or hilar masses. No enlarged lymph nodes. Trachea and esophagus are unremarkable. Lungs/Pleura: Advanced centrilobular emphysema. Patchy airspace opacity along the posterior left upper lobe and peripheral left lower lobe with associated interstitial thickening, new since the prior CT. No other areas of lung consolidation. No pulmonary edema. No pleural effusion or pneumothorax. Upper Abdomen: No acute abnormality. Musculoskeletal: No fracture or acute finding. No suspicious bone lesion. Review of the MIP images confirms the above findings. IMPRESSION: 1. No evidence of a pulmonary embolism. 2. Patchy airspace opacity along the posterior left upper lobe and peripheral left lower lobe with associated interstitial thickening, new since the prior CT. This is consistent with pneumonia. 3. Advanced centrilobular emphysema. 4. Coronary artery calcifications. Aortic Atherosclerosis (ICD10-I70.0) and Emphysema (ICD10-J43.9). Electronically Signed   By: Amie Portland M.D.   On: 01/29/2023 16:45   DG Chest Portable 1 View  Result Date: 01/29/2023 CLINICAL DATA:  Dyspnea.  Chest pain. EXAM: PORTABLE CHEST 1 VIEW COMPARISON:  December 05, 2022. FINDINGS: Stable cardiomediastinal silhouette. Right lung is clear. Mild left basilar opacity is noted concerning for pneumonia or atelectasis with small left pleural effusion. Bony thorax is unremarkable. IMPRESSION: Mild left basilar opacity is noted concerning for pneumonia or atelectasis with small left pleural effusion.  Electronically Signed   By: Lupita Raider M.D.   On: 01/29/2023 15:45    EKG: PR interval 178 ms QRS duration 104 ms QT/QTcB 346/460 ms P-R-T axes 84 109 54 Sinus tachycardia Atrial premature complex  Assessment & Plan:    Principal Problem:   Pneumonia Active Problems:   Chronic respiratory  failure with hypoxia (HCC)   Tobacco use disorder   Anxiety   Vocal cord dysfunction   Acquired hypothyroidism   Chronic obstructive pulmonary disease (HCC)   COPD exacerbation (HCC)   Fall at home, initial encounter   Chronic respiratory failure with hypoxia COPD with acute exacerbation Community-acquired pneumonia -Presents with dyspnea, increased work of breathing, and pleuritic chest pain -PA chest negative for PE but significant for up pneumonia -Continue with pneumonia pathway, follow-up blood cultures, urine Legionella antigen, strep pneumonia antigen -Continue with IV Rocephin and azithromycin -She was encouraged use incentive spirometry and flutter valve -Continue with IV steroids -Continue with as needed nebs -Continue with home Singulair and Breztri  Pleuritic chest pain -Secondary to cough up pneumonia, not typical, EKG nonacute, troponins negative x 2, CTA chest negative for PE  OSA on CPAP - Continue CPAP at bedtime   Type 2 diabetes with neuropathy -Does not appear to be on any home medications -Will keep on sliding scale during hospital stay given she is on IV steroids  Essential hypertension Continue atenolol, and hydrochlorothiazide Will keep on as needed hydralazine continue IV hydralazine 10 mg every 6 hours as needed for SBP > 180   Mixed hyperlipidemia Continue Lipitor   Acquired hypothyroidism Continue Synthroid   GERD Continue Protonix   Diabetic neuropathy -Continue with home medication including Lyrica  Anxiety/depression -Continue with home medication  Urinary retention -bladder scan in ED more than 450, will do one-time in and out, will check UA.  Will monitor bladder scan every 8 hours  Tobacco abuse -Still smoking, will start on nicotine patch  Goals of care of care -Patient reports she is currently under hospice care, upon discussion her CODE STATUS she reports does not wish for any CPR, but if needed to be on life support until  her family come see  (she has been seen couple times with palliative in the past where she remains with full code under hospice), will request palliative consult to clarify her goals of care.   DVT Prophylaxis  Lovenox  AM Labs Ordered, also please review Full Orders  Family Communication: Admission, patients condition and plan of care including tests being ordered have been discussed with the patient *who indicate understanding and agree with the plan and Code Status.  Code Status partial  Likely DC to  home  Condition GUARDED    Consults called: none    Admission status: inpatient    Time spent in minutes : 75 minutes   Huey Bienenstock M.D on 01/29/2023 at 5:36 PM   [facility] - Office  [phone]

## 2023-01-29 NOTE — ED Provider Notes (Signed)
[facility] MEDICAL SURGICAL UNIT Provider Note  CSN: [id number] Arrival date & time: 01/29/23 1331  Chief Complaint(s) Shortness of Breath and Chest Pain  HPI Joan [name] is a 62 y.o. female with PMH COPD with respiratory failure on 3 L home O2, bipolar disorder, hypothyroidism, HTN, HLD who presents emergency department for evaluation of chest pain and shortness of breath.  Patient endorses pleuritic chest pain worse over the right chest wall with deep breaths and worsening shortness of breath.  Denies abdominal pain, nausea, vomiting, headache, fever or other systemic symptoms.  States that she feels this pain every once in a while but it has been persistent for the last 24 hours and worsening.   Past Medical History Past Medical History:  Diagnosis Date   Acute respiratory failure with hypoxia (HCC)    Alcoholism and drug addiction in family    Anxiety    Appendicitis    Arthritis    "knees, hips, lower back" (09/25/2016)   Asthma    Bipolar disorder (HCC)    Chronic bronchitis (HCC)    "all the time" (09/25/2016)   Chronic leg pain    RLE   Chronic lower back pain    COPD (chronic obstructive pulmonary disease) (HCC)    Depression    Diabetes mellitus without complication (HCC)    Diabetic neuropathy (HCC)    Early cataracts, bilateral    Emphysema of lung (HCC)    Generalized anxiety disorder 02/09/2020   GERD (gastroesophageal reflux disease)    Headache    Hepatitis C    "treated back in 2001-2002"   High cholesterol    Hypertension    Hypothyroidism    Incarcerated ventral hernia 04/04/2017   Mood disorder in conditions classified elsewhere 02/09/2020   On home oxygen therapy    "2L; at nighttime" (04/04/2017)   OSA on CPAP    CPAP with Oxygen 3 liters   Paranoid (HCC)    Pneumonia    "all the time" (09/25/2016)   Positive PPD    with negative chest x ray   PTSD (post-traumatic stress disorder) 02/09/2020   Tachycardia    patient reports with exertion ;  denies any other conditions    Thyroid disease    Wears glasses    Wears partial dentures    Patient Active Problem List   Diagnosis Date Noted   Pneumonia 01/29/2023   Acute metabolic encephalopathy 12/06/2022   Hyponatremia 12/06/2022   Polypharmacy 12/06/2022   Fall at home, initial encounter 12/06/2022   Acute encephalopathy 12/06/2022   COPD exacerbation (HCC) 12/05/2022   Coronary artery disease 11/05/2022   Counseling and coordination of care 10/10/2022   Goals of care, counseling/discussion 10/10/2022   Palliative care encounter 10/10/2022   Ulcer of right foot (HCC) 10/09/2022   Obesity (BMI 30-39.9) 09/11/2022   Itching 08/14/2022   PAD (peripheral artery disease) (HCC) 08/14/2022   Cervical radiculopathy 02/14/2022   Chronic left shoulder pain 11/26/2021   Chronic pain of left thumb 11/26/2021   Chronic obstructive pulmonary disease (HCC) 11/17/2021   De Quervain's tenosynovitis, left 11/17/2021   Needs assistance with community resources 11/17/2021   Neuropathy due to type 2 diabetes mellitus (HCC) 10/12/2021   Atypical chest pain 04/17/2021   Hypokalemia 04/03/2021   COPD (chronic obstructive pulmonary disease) with emphysema (HCC) 04/03/2021   Acquired hypothyroidism    Controlled type 2 diabetes mellitus with neuropathy (HCC)    Generalized abdominal pain 03/28/2021   Depression 08/16/2020   PTSD (  post-traumatic stress disorder) 02/09/2020   Mood disorder in conditions classified elsewhere 02/09/2020   Generalized anxiety disorder 02/09/2020   Coarse tremors 10/28/2019   Chronic rhinitis 10/21/2019   Allergic rhinitis 06/16/2019   Cervical spondylosis without myelopathy 12/18/2018   Hypertension    Encounter for general adult medical examination with abnormal findings 11/06/2018   Smokers' cough (HCC) 01/15/2018   Pulmonary nodule 11/11/2017   Carotid artery disease (HCC) 11/05/2017   OSA on CPAP 08/07/2017   GERD (gastroesophageal reflux disease)  06/23/2017   Hypothyroidism due to acquired atrophy of thyroid 04/05/2017   Primary osteoarthritis involving multiple joints 04/05/2017   Chronic constipation 02/10/2017   Iron deficiency anemia due to dietary causes 02/10/2017   Osteoarthritis 02/10/2017   Mixed hyperlipidemia 02/10/2017   Chronic respiratory failure with hypoxia (HCC) 12/16/2016   Essential hypertension 11/29/2016   Vocal cord dysfunction    Tobacco use disorder 09/24/2016   Anxiety 09/24/2016   Home Medication(s) Prior to Admission medications   Medication Sig Start Date End Date Taking? Authorizing Provider  Budeson-Glycopyrrol-Formoterol (BREZTRI AEROSPHERE) 160-9-4.8 MCG/ACT AERO Inhale 2 puffs into the lungs in the morning and at bedtime. 01/08/23  Yes Oretha Milch, MD  diazepam (VALIUM) 5 MG tablet Take 1 tablet (5 mg total) by mouth every 6 (six) hours as needed for anxiety. 12/07/22  Yes Johnson, Clanford L, MD  famotidine (PEPCID) 20 MG tablet Take 20 mg by mouth daily. 09/06/22  Yes [provider]  furosemide (LASIX) 20 MG tablet Take 20 mg by mouth daily as needed. 09/16/22  Yes [provider]  hydrochlorothiazide (HYDRODIURIL) 25 MG tablet Take 25 mg by mouth daily. 01/01/23  Yes [provider]  Ipratropium-Albuterol (COMBIVENT RESPIMAT) 20-100 MCG/ACT AERS respimat Inhale 1 puff into the lungs every 6 (six) hours as needed for wheezing or shortness of breath. 04/02/22  Yes Bevelyn Ngo, NP  ACCU-CHEK GUIDE test strip USE TO CHECK BLOOD SUGAR IN THE  MORNING, AT NOON, AND AT BEDTIME 12/13/22   Myrlene Broker, MD  amLODipine (NORVASC) 5 MG tablet Take 1 tablet (5 mg total) by mouth daily. 10/11/22 01/09/23  Narda Bonds, MD  atenolol (TENORMIN) 25 MG tablet TAKE 1 TABLET BY MOUTH DAILY 09/18/22   Myrlene Broker, MD  atorvastatin (LIPITOR) 20 MG tablet TAKE 1 TABLET BY MOUTH ONCE  DAILY 07/23/22   Myrlene Broker, MD  buPROPion (WELLBUTRIN XL) 300 MG 24 hr tablet  TAKE 1 TABLET BY MOUTH DAILY 06/04/22   Thresa Ross, MD  fluticasone (FLONASE) 50 MCG/ACT nasal spray Place 2 sprays into both nostrils daily as needed for allergies. 12/07/22 03/07/23  Johnson, Clanford L, MD  guaiFENesin-Codeine 225-7.5 MG/5ML LIQD Take 5 mLs by mouth 3 (three) times daily as needed. 07/30/22   Oretha Milch, MD  levothyroxine (SYNTHROID) 25 MCG tablet TAKE 1 TABLET BY MOUTH DAILY  BEFORE BREAKFAST Patient taking differently: Take 25 mcg by mouth daily before breakfast. 07/23/22   Myrlene Broker, MD  MELATONIN PO Take 5 mg by mouth at bedtime as needed (sleep).    [provider]  montelukast (SINGULAIR) 10 MG tablet TAKE 1 TABLET BY MOUTH AT  BEDTIME Patient taking differently: Take 10 mg by mouth at bedtime. 07/23/22   Oretha Milch, MD  naproxen (NAPROSYN) 500 MG tablet Take 1 tablet (500 mg total) by mouth 2 (two) times daily with a meal. Patient taking differently: Take 500 mg by mouth daily as needed for mild pain  or headache. 03/29/22   Myrlene Broker, MD  OXYGEN Inhale 3 L/min into the lungs See admin instructions. Inhale  3 L/min of oxygen into the lungs w/CPAP at bedtime and as needed for shortness of breath with "strenuous activity" during the day    [provider]  pantoprazole (PROTONIX) 40 MG tablet TAKE 1 TABLET BY MOUTH DAILY 12/16/22   Myrlene Broker, MD  pregabalin (LYRICA) 300 MG capsule TAKE 1 CAPSULE BY MOUTH TWICE  DAILY 11/25/22   Myrlene Broker, MD  QUEtiapine (SEROQUEL) 100 MG tablet TAKE 1 TABLET BY MOUTH AT  BEDTIME 07/31/22   Thresa Ross, MD  roflumilast (DALIRESP) 500 MCG TABS tablet Take 1 tablet (500 mcg total) by mouth daily. 04/02/22   Bevelyn Ngo, NP  silver sulfADIAZINE (SILVADENE) 1 % cream Apply 1 Application topically daily. 10/09/22   Felecia Shelling, DPM  venlafaxine XR (EFFEXOR-XR) 150 MG 24 hr capsule TAKE 1 CAPSULE BY MOUTH DAILY  WITH BREAKFAST 12/16/22   Myrlene Broker, MD  witch  hazel-glycerin (TUCKS) pad Apply topically as needed for itching. Patient taking differently: Apply 1 Application topically daily as needed for itching. 08/18/20   Standley Brooking, MD                                                                                                                                    Past Surgical History Past Surgical History:  Procedure Laterality Date   ANTERIOR CERVICAL DECOMP/DISCECTOMY FUSION N/A 02/14/2022   Procedure: Anterior Cervical Decompression/Discectomy Fusion Cervical Three-Cervical Four;  Surgeon: Bedelia Person, MD;  Location: [facility] OR;  Service: Neurosurgery;  Laterality: N/A;  3C   APPENDECTOMY     BACK SURGERY     BIOPSY  11/24/2018   Procedure: BIOPSY;  Surgeon: Tressia Danas, MD;  Location: [facility];  Service: Gastroenterology;;   CARDIAC CATHETERIZATION     COLONOSCOPY     COLONOSCOPY WITH PROPOFOL N/A 11/24/2018   Procedure: COLONOSCOPY WITH PROPOFOL;  Surgeon: Tressia Danas, MD;  Location: [facility];  Service: Gastroenterology;  Laterality: N/A;   CYST EXCISION     removal of cyst in sinuses   DILATION AND CURETTAGE OF UTERUS     ESOPHAGOGASTRODUODENOSCOPY (EGD) WITH PROPOFOL N/A 11/24/2018   Procedure: ESOPHAGOGASTRODUODENOSCOPY (EGD) WITH PROPOFOL;  Surgeon: Tressia Danas, MD;  Location: [facility];  Service: Gastroenterology;  Laterality: N/A;   FOOT SURGERY Bilateral    bone removed from 4th and 5 th toes and put in pin then took pin out   HERNIA REPAIR     INCISION AND DRAINAGE  ~ 2014/2015   "removed mesh & infection"   INCISIONAL HERNIA REPAIR N/A 04/04/2017   Procedure: LAPAROSCOPIC INCISIONAL HERNIA REPAIR WITH MESH;  Surgeon: Claud Kelp, MD;  Location: [facility] OR;  Service: General;  Laterality: N/A;   LACERATION REPAIR Right    repair wrist artery from laceration from ice skate  LAPAROSCOPIC APPENDECTOMY N/A 03/31/2019   Procedure: APPENDECTOMY LAPAROSCOPIC;  Surgeon: Axel Filler, MD;   Location: [facility] OR[facility];  Service: General;  Laterality: N/A;   LAPAROSCOPIC APPENDECTOMY N/A 07/09/2019   Procedure: APPENDECTOMY LAPAROSCOPIC;  Surgeon: Axel Filler, MD;  Location: [facility] OR;  Service: General;  Laterality: N/A;   LAPAROSCOPIC INCISIONAL / UMBILICAL / VENTRAL HERNIA REPAIR  04/04/2017   LAPAROSCOPIC INCISIONAL HERNIA REPAIR WITH MESH   LIPOMA EXCISION Right    fattty lipoma in neck   NASAL SEPTUM SURGERY     POSTERIOR LUMBAR FUSION  2015/2016 X 2   "added rods and screws"   SINOSCOPY     TOTAL HIP ARTHROPLASTY Right 02/05/2017   Procedure: RIGHT TOTAL HIP ARTHROPLASTY;  Surgeon: Ranee Gosselin, MD;  Location: [facility];  Service: Orthopedics;  Laterality: Right;   TOTAL HIP ARTHROPLASTY Left 06/18/2017   Procedure: LEFT TOTAL HIP ARTHROPLASTY;  Surgeon: Ranee Gosselin, MD;  Location: [facility];  Service: Orthopedics;  Laterality: Left;   TOTAL HIP REVISION Left 01/26/2019   Procedure: TOTAL HIP REVISION;  Surgeon: Durene Romans, MD;  Location: [facility];  Service: Orthopedics;  Laterality: Left;  2 hrs   TUBAL LIGATION     UMBILICAL HERNIA REPAIR  ~ 2013   w/mesh   UPPER GI ENDOSCOPY     Family History Family History  Problem Relation Age of Onset   Diabetes Mother    Hypertension Mother    Hypertension Father    Cancer Father        lung   Breast cancer Neg Hx     Social History Social History   Tobacco Use   Smoking status: Former    Current packs/day: 2.00    Average packs/day: 2.0 packs/day for 47.9 years (95.8 ttl pk-yrs)    Types: Cigarettes    Start date: 1977    Passive exposure: Current   Smokeless tobacco: Never   Tobacco comments:    1ppd as of 07/30/22 ep  Vaping Use   Vaping status: Never Used  Substance Use Topics   Alcohol use: Not Currently   Drug use: Not Currently    Types: "Crack" cocaine    Comment:  "nothing since 01/12/2012"   Allergies Keflex [cephalexin], Shellfish allergy, Tetracyclines & related, Dilaudid [hydromorphone hcl],  Prednisone, Clindamycin/lincomycin, Incruse ellipta [umeclidinium bromide], Molds & smuts, Tuberculin tests, and Umeclidinium  Review of Systems Review of Systems  Respiratory:  Positive for shortness of breath.   Cardiovascular:  Positive for chest pain.    Physical Exam Vital Signs  I have reviewed the triage vital signs BP (!) 160/86 (BP Location: Left Arm)   Pulse (!) 110   Temp 98.6 F (37 C) (Oral)   Resp 20   Ht 5\' 8"  (1.727 m)   Wt 86.2 kg   SpO2 92%   BMI 28.89 kg/m   Physical Exam Vitals and nursing note reviewed.  Constitutional:      General: She is not in acute distress.    Appearance: She is well-developed.  HENT:     Head: Normocephalic and atraumatic.  Eyes:     Conjunctiva/sclera: Conjunctivae normal.  Cardiovascular:     Rate and Rhythm: Normal rate and regular rhythm.     Heart sounds: No murmur heard. Pulmonary:     Effort: Pulmonary effort is normal. No respiratory distress.     Breath sounds: Wheezing present.  Abdominal:     Palpations: Abdomen is soft.     Tenderness: There is no abdominal tenderness.  Musculoskeletal:        General: No swelling.     Cervical back: Neck supple.  Skin:    General: Skin is warm and dry.     Capillary Refill: Capillary refill takes less than 2 seconds.  Neurological:     Mental Status: She is alert.  Psychiatric:        Mood and Affect: Mood normal.     ED Results and Treatments Labs (all labs ordered are listed, but only abnormal results are displayed) Labs Reviewed  COMPREHENSIVE METABOLIC PANEL - Abnormal; Notable for the following components:      Result Value   Chloride 94 (*)    CO2 33 (*)    Glucose, Bld 148 (*)    AST 12 (*)    All other components within normal limits  CBC WITH DIFFERENTIAL/PLATELET - Abnormal; Notable for the following components:   WBC 26.5 (*)    Neutro Abs 23.3 (*)    Monocytes Absolute 1.4 (*)    Abs Immature Granulocytes 0.58 (*)    All other components within  normal limits  BRAIN NATRIURETIC PEPTIDE - Abnormal; Notable for the following components:   B Natriuretic Peptide 128.0 (*)    All other components within normal limits  URINALYSIS, ROUTINE W REFLEX MICROSCOPIC - Abnormal; Notable for the following components:   Specific Gravity, Urine >1.046 (*)    Hgb urine dipstick SMALL (*)    Protein, ur 30 (*)    Bacteria, UA RARE (*)    All other components within normal limits  CULTURE, BLOOD (ROUTINE X 2)  CULTURE, BLOOD (ROUTINE X 2)  EXPECTORATED SPUTUM ASSESSMENT W GRAM STAIN, RFLX TO RESP C  EXPECTORATED SPUTUM ASSESSMENT W GRAM STAIN, RFLX TO RESP C  STREP PNEUMONIAE URINARY ANTIGEN  LEGIONELLA PNEUMOPHILA SEROGP 1 UR AG  BASIC METABOLIC PANEL  CBC  TROPONIN I (HIGH SENSITIVITY)  TROPONIN I (HIGH SENSITIVITY)                                                                                                                          Radiology CT Angio Chest PE W and/or Wo Contrast  Result Date: 01/29/2023 CLINICAL DATA:  Left lower chest pain since yesterday with shortness of breath. EXAM: CT ANGIOGRAPHY CHEST WITH CONTRAST TECHNIQUE: Multidetector CT imaging of the chest was performed using the standard protocol during bolus administration of intravenous contrast. Multiplanar CT image reconstructions and MIPs were obtained to evaluate the vascular anatomy. RADIATION DOSE REDUCTION: This exam was performed according to the departmental dose-optimization program which includes automated exposure control, adjustment of the mA and/or kV according to patient size and/or use of iterative reconstruction technique. CONTRAST:  75mL OMNIPAQUE IOHEXOL 350 MG/ML SOLN COMPARISON:  Chest CT, lung cancer screening, 05/02/2022. FINDINGS: Cardiovascular: Study mildly limited relative decreased contrast opacification of peripheral pulmonary arteries and some motion artifact. Allowing for this, there is no evidence of a pulmonary embolism. Heart is normal in size  and configuration.  Left coronary artery and circumflex coronary artery calcifications. No pericardial effusion. Great vessels are normal in caliber. Mild aortic atherosclerosis. No dissection. Arch branch vessels are widely patent. Mediastinum/Nodes: No neck base, mediastinal or hilar masses. No enlarged lymph nodes. Trachea and esophagus are unremarkable. Lungs/Pleura: Advanced centrilobular emphysema. Patchy airspace opacity along the posterior left upper lobe and peripheral left lower lobe with associated interstitial thickening, new since the prior CT. No other areas of lung consolidation. No pulmonary edema. No pleural effusion or pneumothorax. Upper Abdomen: No acute abnormality. Musculoskeletal: No fracture or acute finding. No suspicious bone lesion. Review of the MIP images confirms the above findings. IMPRESSION: 1. No evidence of a pulmonary embolism. 2. Patchy airspace opacity along the posterior left upper lobe and peripheral left lower lobe with associated interstitial thickening, new since the prior CT. This is consistent with pneumonia. 3. Advanced centrilobular emphysema. 4. Coronary artery calcifications. Aortic Atherosclerosis (ICD10-I70.0) and Emphysema (ICD10-J43.9). Electronically Signed   By: Amie Portland M.D.   On: 01/29/2023 16:45   DG Chest Portable 1 View  Result Date: 01/29/2023 CLINICAL DATA:  Dyspnea.  Chest pain. EXAM: PORTABLE CHEST 1 VIEW COMPARISON:  December 05, 2022. FINDINGS: Stable cardiomediastinal silhouette. Right lung is clear. Mild left basilar opacity is noted concerning for pneumonia or atelectasis with small left pleural effusion. Bony thorax is unremarkable. IMPRESSION: Mild left basilar opacity is noted concerning for pneumonia or atelectasis with small left pleural effusion. Electronically Signed   By: Lupita Raider M.D.   On: 01/29/2023 15:45    Pertinent labs & imaging results that were available during my care of the patient were reviewed by me and  considered in my medical decision making (see MDM for details).  Medications Ordered in ED Medications  azithromycin (ZITHROMAX) 500 mg in sodium chloride 0.9 % 250 mL IVPB (500 mg Intravenous New Bag/Given 01/29/23 1659)  diazepam (VALIUM) tablet 5 mg (has no administration in time range)  QUEtiapine (SEROQUEL) tablet 100 mg (has no administration in time range)  venlafaxine XR (EFFEXOR-XR) 24 hr capsule 150 mg (has no administration in time range)  levothyroxine (SYNTHROID) tablet 25 mcg (has no administration in time range)  famotidine (PEPCID) tablet 20 mg (has no administration in time range)  pantoprazole (PROTONIX) EC tablet 40 mg (has no administration in time range)  melatonin tablet 3 mg (has no administration in time range)  pregabalin (LYRICA) capsule 300 mg (has no administration in time range)  Budeson-Glycopyrrol-Formoterol 160-9-4.8 MCG/ACT AERO 2 puff (has no administration in time range)  montelukast (SINGULAIR) tablet 10 mg (has no administration in time range)  cefTRIAXone (ROCEPHIN) 2 g in sodium chloride 0.9 % 100 mL IVPB (has no administration in time range)  enoxaparin (LOVENOX) injection 40 mg (has no administration in time range)  albuterol (PROVENTIL) (2.5 MG/3ML) 0.083% nebulizer solution 2.5 mg (2.5 mg Nebulization Given 01/29/23 2002)  nicotine (NICODERM CQ - dosed in mg/24 hours) patch 21 mg (has no administration in time range)  hydrALAZINE (APRESOLINE) injection 5 mg (5 mg Intravenous Given 01/29/23 1811)  methylPREDNISolone sodium succinate (SOLU-MEDROL) 125 mg/2 mL injection 80 mg (has no administration in time range)  atenolol (TENORMIN) tablet 25 mg (has no administration in time range)  atorvastatin (LIPITOR) tablet 20 mg (has no administration in time range)  hydrochlorothiazide (HYDRODIURIL) tablet 25 mg (has no administration in time range)  ipratropium-albuterol (DUONEB) 0.5-2.5 (3) MG/3ML nebulizer solution 9 mL (9 mLs Nebulization Given 01/29/23 1421)   methylPREDNISolone sodium succinate (SOLU-MEDROL) 125 mg/2  mL injection 125 mg (125 mg Intravenous Given 01/29/23 1506)  cefTRIAXone (ROCEPHIN) 2 g in sodium chloride 0.9 % 100 mL IVPB (0 g Intravenous Stopped 01/29/23 1656)  iohexol (OMNIPAQUE) 350 MG/ML injection 75 mL (75 mLs Intravenous Contrast Given 01/29/23 1523)                                                                                                                                     Procedures .Critical Care  Performed by: Glendora Score, MD Authorized by: Glendora Score, MD   Critical care provider statement:    Critical care time (minutes):  30   Critical care was necessary to treat or prevent imminent or life-threatening deterioration of the following conditions:  Respiratory failure   Critical care was time spent personally by me on the following activities:  Development of treatment plan with patient or surrogate, discussions with consultants, evaluation of patient's response to treatment, examination of patient, ordering and review of laboratory studies, ordering and review of radiographic studies, ordering and performing treatments and interventions, pulse oximetry, re-evaluation of patient's condition and review of old charts   (including critical care time)  Medical Decision Making / ED Course   This patient presents to the ED for concern of shortness of breath, chest pain, this involves an extensive number of treatment options, and is a complaint that carries with it a high risk of complications and morbidity.  The differential diagnosis includes Pe, PTX, Pulmonary Edema, ARDS, COPD/Asthma, ACS, CHF exacerbation, Arrhythmia, Pericardial Effusion/Tamponade, Anemia, Sepsis, Acidosis/Hypercapnia, Anxiety, Viral URI  MDM: Patient seen emergency room for evaluation of pleuritic chest pain and shortness of breath.  Physical exam with expiratory wheezing bilaterally and mild tachypnea.  Laboratory evaluation with a  significant leukocytosis 26.5, BNP 128.  Chest x-ray concerning for pneumonia and patient started on ceftriaxone azithromycin.  Did speak with pharmacist about previous reaction to Keflex and it does appear patient has tolerated ceftriaxone in the past.  Patient given 3 DuoNebs and methylprednisolone with improvement of her wheezing.  Pending PE study at time of signout.  Please see provider signout note for continuation of workup.  Anticipate admission   Additional history obtained:  -External records from outside source obtained and reviewed including: Chart review including previous notes, labs, imaging, consultation notes   Lab Tests: -I ordered, reviewed, and interpreted labs.   The pertinent results include:   Labs Reviewed  COMPREHENSIVE METABOLIC PANEL - Abnormal; Notable for the following components:      Result Value   Chloride 94 (*)    CO2 33 (*)    Glucose, Bld 148 (*)    AST 12 (*)    All other components within normal limits  CBC WITH DIFFERENTIAL/PLATELET - Abnormal; Notable for the following components:   WBC 26.5 (*)    Neutro Abs 23.3 (*)    Monocytes Absolute 1.4 (*)    Abs Immature Granulocytes 0.58 (*)  All other components within normal limits  BRAIN NATRIURETIC PEPTIDE - Abnormal; Notable for the following components:   B Natriuretic Peptide 128.0 (*)    All other components within normal limits  URINALYSIS, ROUTINE W REFLEX MICROSCOPIC - Abnormal; Notable for the following components:   Specific Gravity, Urine >1.046 (*)    Hgb urine dipstick SMALL (*)    Protein, ur 30 (*)    Bacteria, UA RARE (*)    All other components within normal limits  CULTURE, BLOOD (ROUTINE X 2)  CULTURE, BLOOD (ROUTINE X 2)  EXPECTORATED SPUTUM ASSESSMENT W GRAM STAIN, RFLX TO RESP C  EXPECTORATED SPUTUM ASSESSMENT W GRAM STAIN, RFLX TO RESP C  STREP PNEUMONIAE URINARY ANTIGEN  LEGIONELLA PNEUMOPHILA SEROGP 1 UR AG  BASIC METABOLIC PANEL  CBC  TROPONIN I (HIGH  SENSITIVITY)  TROPONIN I (HIGH SENSITIVITY)      EKG   EKG Interpretation Date/Time:  Wednesday January 29 2023 13:47:46 EST Ventricular Rate:  106 PR Interval:  178 QRS Duration:  104 QT Interval:  346 QTC Calculation: 460 R Axis:   109  Text Interpretation: Sinus tachycardia Atrial premature complex Confirmed by Dezerae Freiberger ([id number]) on 01/29/2023 2:36:48 PM         Imaging Studies ordered: I ordered imaging studies including chest x-ray I independently visualized and interpreted imaging. I agree with the radiologist interpretation  CT PE pending   Medicines ordered and prescription drug management: Meds ordered this encounter  Medications   ipratropium-albuterol (DUONEB) 0.5-2.5 (3) MG/3ML nebulizer solution 9 mL   methylPREDNISolone sodium succinate (SOLU-MEDROL) 125 mg/2 mL injection 125 mg   cefTRIAXone (ROCEPHIN) 2 g in sodium chloride 0.9 % 100 mL IVPB    Order Specific Question:   Antibiotic Indication:    Answer:   CAP   azithromycin (ZITHROMAX) 500 mg in sodium chloride 0.9 % 250 mL IVPB    Order Specific Question:   Antibiotic Indication:    Answer:   CAP   iohexol (OMNIPAQUE) 350 MG/ML injection 75 mL   DISCONTD: atenolol (TENORMIN) tablet 25 mg   DISCONTD: atorvastatin (LIPITOR) tablet 20 mg   DISCONTD: hydrochlorothiazide (HYDRODIURIL) tablet 25 mg   diazepam (VALIUM) tablet 5 mg   QUEtiapine (SEROQUEL) tablet 100 mg   venlafaxine XR (EFFEXOR-XR) 24 hr capsule 150 mg    TAKE 1 CAPSULE BY MOUTH  DAILY WITH BREAKFAST     levothyroxine (SYNTHROID) tablet 25 mcg   famotidine (PEPCID) tablet 20 mg   pantoprazole (PROTONIX) EC tablet 40 mg   melatonin tablet 3 mg   pregabalin (LYRICA) capsule 300 mg   Budeson-Glycopyrrol-Formoterol 160-9-4.8 MCG/ACT AERO 2 puff   montelukast (SINGULAIR) tablet 10 mg   cefTRIAXone (ROCEPHIN) 2 g in sodium chloride 0.9 % 100 mL IVPB    Order Specific Question:   Antibiotic Indication:    Answer:   CAP   DISCONTD:  azithromycin (ZITHROMAX) 500 mg in sodium chloride 0.9 % 250 mL IVPB    Order Specific Question:   Antibiotic Indication:    Answer:   CAP   enoxaparin (LOVENOX) injection 40 mg   albuterol (PROVENTIL) (2.5 MG/3ML) 0.083% nebulizer solution 2.5 mg   nicotine (NICODERM CQ - dosed in mg/24 hours) patch 21 mg   hydrALAZINE (APRESOLINE) injection 5 mg   methylPREDNISolone sodium succinate (SOLU-MEDROL) 125 mg/2 mL injection 80 mg   atenolol (TENORMIN) tablet 25 mg   atorvastatin (LIPITOR) tablet 20 mg   hydrochlorothiazide (HYDRODIURIL) tablet 25 mg    -I have  reviewed the patients home medicines and have made adjustments as needed  Critical interventions Multiple DuoNebs, antibiotics, steroids    Cardiac Monitoring: The patient was maintained on a cardiac monitor.  I personally viewed and interpreted the cardiac monitored which showed an underlying rhythm of: NSR  Social Determinants of Health:  Factors impacting patients care include: none   Reevaluation: After the interventions noted above, I reevaluated the patient and found that they have :improved  Co morbidities that complicate the patient evaluation  Past Medical History:  Diagnosis Date   Acute respiratory failure with hypoxia (HCC)    Alcoholism and drug addiction in family    Anxiety    Appendicitis    Arthritis    "knees, hips, lower back" (09/25/2016)   Asthma    Bipolar disorder (HCC)    Chronic bronchitis (HCC)    "all the time" (09/25/2016)   Chronic leg pain    RLE   Chronic lower back pain    COPD (chronic obstructive pulmonary disease) (HCC)    Depression    Diabetes mellitus without complication (HCC)    Diabetic neuropathy (HCC)    Early cataracts, bilateral    Emphysema of lung (HCC)    Generalized anxiety disorder 02/09/2020   GERD (gastroesophageal reflux disease)    Headache    Hepatitis C    "treated back in 2001-2002"   High cholesterol    Hypertension    Hypothyroidism    Incarcerated  ventral hernia 04/04/2017   Mood disorder in conditions classified elsewhere 02/09/2020   On home oxygen therapy    "2L; at nighttime" (04/04/2017)   OSA on CPAP    CPAP with Oxygen 3 liters   Paranoid (HCC)    Pneumonia    "all the time" (09/25/2016)   Positive PPD    with negative chest x ray   PTSD (post-traumatic stress disorder) 02/09/2020   Tachycardia    patient reports with exertion ; denies any other conditions    Thyroid disease    Wears glasses    Wears partial dentures       Dispostion: I considered admission for this patient, and disposition pending completion of imaging studies.  Please see provider signout note continuation of workup.     Final Clinical Impression(s) / ED Diagnoses Final diagnoses:  COPD exacerbation (HCC)  Community acquired pneumonia, unspecified laterality     @PCDICTATION @    Anothony Bursch, Wyn Forster, MD 01/29/23 2029

## 2023-01-29 NOTE — ED Notes (Signed)
EDP at bedside during triage 

## 2023-01-29 NOTE — ED Triage Notes (Signed)
Pt arrived [facility] for c/o SOB and left lower chest pain since yesterday/ Pt was at home with her hospice nurse. Pt is on 3 lpm nasal cannula continuously.

## 2023-01-29 NOTE — ED Notes (Signed)
ED TO INPATIENT HANDOFF REPORT  ED Nurse Name and Phone #: Tamyrah Burbage  S Name/Age/Gender Joan [name] 62 y.o. female Room/Bed: APA05/APA05  Code Status   Code Status: Prior  Home/SNF/Other Home Patient oriented to: self, place, time, and situation Is this baseline? Yes   Triage Complete: Triage complete  Chief Complaint Pneumonia [J18.9]  Triage Note Pt arrived [facility] for c/o SOB and left lower chest pain since yesterday/ Pt was at home with her hospice nurse. Pt is on 3 lpm nasal cannula continuously.    Allergies Allergies  Allergen Reactions   Keflex [Cephalexin] Rash and Other (See Comments)    Has tolerated amoxicillin since had keflex   Shellfish Allergy Shortness Of Breath, Nausea And Vomiting and Other (See Comments)    Stomach cramps   Tetracyclines & Related Anaphylaxis, Swelling and Other (See Comments)    Throat swelling requiring hospitalization   Dilaudid [Hydromorphone Hcl] Other (See Comments)    "Lethargy"   Prednisone Other (See Comments)    "counter reacts", has tolerated IV medrol   Clindamycin/Lincomycin Other (See Comments)    Burning sensation in her mouth down her throat    Incruse Ellipta [Umeclidinium Bromide] Nausea Only   Molds & Smuts Itching    "Allergic to everything", pt said she is allergic to most environmental allergens   Tuberculin Tests Other (See Comments)    False postive   Umeclidinium     Other Reaction(s): GI Intolerance    Level of Care/Admitting Diagnosis ED Disposition     ED Disposition  Admit   Condition  --   Comment  Hospital Area: [facility] [[id number]]  Level of Care: Telemetry [5]  Covid Evaluation: Asymptomatic - no recent exposure (last 10 days) testing not required  Diagnosis: Pneumonia [629528]  Admitting Physician: Chiquita Loth  Attending Physician: Randol Kern, DAWOOD S [[id number]]  Certification:: I certify this patient will need inpatient services for at least 2 midnights   Expected Medical Readiness: 01/31/2023          B Medical/Surgery History Past Medical History:  Diagnosis Date   Acute respiratory failure with hypoxia (HCC)    Alcoholism and drug addiction in family    Anxiety    Appendicitis    Arthritis    "knees, hips, lower back" (09/25/2016)   Asthma    Bipolar disorder (HCC)    Chronic bronchitis (HCC)    "all the time" (09/25/2016)   Chronic leg pain    RLE   Chronic lower back pain    COPD (chronic obstructive pulmonary disease) (HCC)    Depression    Diabetes mellitus without complication (HCC)    Diabetic neuropathy (HCC)    Early cataracts, bilateral    Emphysema of lung (HCC)    Generalized anxiety disorder 02/09/2020   GERD (gastroesophageal reflux disease)    Headache    Hepatitis C    "treated back in 2001-2002"   High cholesterol    Hypertension    Hypothyroidism    Incarcerated ventral hernia 04/04/2017   Mood disorder in conditions classified elsewhere 02/09/2020   On home oxygen therapy    "2L; at nighttime" (04/04/2017)   OSA on CPAP    CPAP with Oxygen 3 liters   Paranoid (HCC)    Pneumonia    "all the time" (09/25/2016)   Positive PPD    with negative chest x ray   PTSD (post-traumatic stress disorder) 02/09/2020   Tachycardia    patient reports with exertion ;  denies any other conditions    Thyroid disease    Wears glasses    Wears partial dentures    Past Surgical History:  Procedure Laterality Date   ANTERIOR CERVICAL DECOMP/DISCECTOMY FUSION N/A 02/14/2022   Procedure: Anterior Cervical Decompression/Discectomy Fusion Cervical Three-Cervical Four;  Surgeon: Bedelia Person, MD;  Location: [facility] OR;  Service: Neurosurgery;  Laterality: N/A;  3C   APPENDECTOMY     BACK SURGERY     BIOPSY  11/24/2018   Procedure: BIOPSY;  Surgeon: Tressia Danas, MD;  Location: [facility];  Service: Gastroenterology;;   CARDIAC CATHETERIZATION     COLONOSCOPY     COLONOSCOPY WITH PROPOFOL N/A 11/24/2018    Procedure: COLONOSCOPY WITH PROPOFOL;  Surgeon: Tressia Danas, MD;  Location: [facility];  Service: Gastroenterology;  Laterality: N/A;   CYST EXCISION     removal of cyst in sinuses   DILATION AND CURETTAGE OF UTERUS     ESOPHAGOGASTRODUODENOSCOPY (EGD) WITH PROPOFOL N/A 11/24/2018   Procedure: ESOPHAGOGASTRODUODENOSCOPY (EGD) WITH PROPOFOL;  Surgeon: Tressia Danas, MD;  Location: [facility];  Service: Gastroenterology;  Laterality: N/A;   FOOT SURGERY Bilateral    bone removed from 4th and 5 th toes and put in pin then took pin out   HERNIA REPAIR     INCISION AND DRAINAGE  ~ 2014/2015   "removed mesh & infection"   INCISIONAL HERNIA REPAIR N/A 04/04/2017   Procedure: LAPAROSCOPIC INCISIONAL HERNIA REPAIR WITH MESH;  Surgeon: Claud Kelp, MD;  Location: [facility] OR;  Service: General;  Laterality: N/A;   LACERATION REPAIR Right    repair wrist artery from laceration from ice skate   LAPAROSCOPIC APPENDECTOMY N/A 03/31/2019   Procedure: APPENDECTOMY LAPAROSCOPIC;  Surgeon: Axel Filler, MD;  Location: [facility];  Service: General;  Laterality: N/A;   LAPAROSCOPIC APPENDECTOMY N/A 07/09/2019   Procedure: APPENDECTOMY LAPAROSCOPIC;  Surgeon: Axel Filler, MD;  Location: [facility] OR;  Service: General;  Laterality: N/A;   LAPAROSCOPIC INCISIONAL / UMBILICAL / VENTRAL HERNIA REPAIR  04/04/2017   LAPAROSCOPIC INCISIONAL HERNIA REPAIR WITH MESH   LIPOMA EXCISION Right    fattty lipoma in neck   NASAL SEPTUM SURGERY     POSTERIOR LUMBAR FUSION  2015/2016 X 2   "added rods and screws"   SINOSCOPY     TOTAL HIP ARTHROPLASTY Right 02/05/2017   Procedure: RIGHT TOTAL HIP ARTHROPLASTY;  Surgeon: Ranee Gosselin, MD;  Location: [facility];  Service: Orthopedics;  Laterality: Right;   TOTAL HIP ARTHROPLASTY Left 06/18/2017   Procedure: LEFT TOTAL HIP ARTHROPLASTY;  Surgeon: Ranee Gosselin, MD;  Location: [facility];  Service: Orthopedics;  Laterality: Left;   TOTAL HIP REVISION Left 01/26/2019    Procedure: TOTAL HIP REVISION;  Surgeon: Durene Romans, MD;  Location: [facility];  Service: Orthopedics;  Laterality: Left;  2 hrs   TUBAL LIGATION     UMBILICAL HERNIA REPAIR  ~ 2013   w/mesh   UPPER GI ENDOSCOPY       A IV Location/Drains/Wounds Patient Lines/Drains/Airways Status     Active Line/Drains/Airways     Name Placement date Placement time Site Days   Peripheral IV 01/29/23 20 G Posterior;Right Forearm 01/29/23  1500  Forearm  less than 1   Pressure Injury 08/15/20 Deep Tissue Pressure Injury - Purple or maroon localized area of discolored intact skin or blood-filled blister due to damage of underlying soft tissue from pressure and/or shear. 08/15/20  --  -- 897   Wound / Incision (Open or Dehisced) 10/09/22 Diabetic  ulcer Pretibial Right;Anterior 10/09/22  2330  Pretibial  112   Wound / Incision (Open or Dehisced) 10/09/22 Diabetic ulcer Toe (Comment  which one) Anterior;Right 10/09/22  2330  Toe (Comment  which one)  112   Wound / Incision (Open or Dehisced) 10/09/22 Diabetic ulcer Toe (Comment  which one) Anterior;Right 10/09/22  2330  Toe (Comment  which one)  112            Intake/Output Last 24 hours No intake or output data in the 24 hours ending 01/29/23 1719  Labs/Imaging Results for orders placed or performed during the hospital encounter of 01/29/23 (from the past 48 hour(s))  Comprehensive metabolic panel     Status: Abnormal   Collection Time: 01/29/23  2:21 PM  Result Value Ref Range   Sodium 135 135 - 145 mmol/L   Potassium 3.5 3.5 - 5.1 mmol/L   Chloride 94 (L) 98 - 111 mmol/L   CO2 33 (H) 22 - 32 mmol/L   Glucose, Bld 148 (H) 70 - 99 mg/dL    Comment: Glucose reference range applies only to samples taken after fasting for at least 8 hours.   BUN 9 8 - 23 mg/dL   Creatinine, Ser 9.14 0.44 - 1.00 mg/dL   Calcium 9.3 8.9 - 78.2 mg/dL   Total Protein 6.9 6.5 - 8.1 g/dL   Albumin 3.6 3.5 - 5.0 g/dL   AST 12 (L) 15 - 41 U/L   ALT 14 0 - 44 U/L    Alkaline Phosphatase 107 38 - 126 U/L   Total Bilirubin 0.8 <1.2 mg/dL   GFR, Estimated >95 >62 mL/min    Comment: (NOTE) Calculated using the CKD-EPI Creatinine Equation (2021)    Anion gap 8 5 - 15    Comment: Performed at [facility], [address] [place], [place] [address]  Troponin I (High Sensitivity)     Status: None   Collection Time: 01/29/23  2:21 PM  Result Value Ref Range   Troponin I (High Sensitivity) 5 <18 ng/L    Comment: (NOTE) Elevated high sensitivity troponin I (hsTnI) values and significant  changes across serial measurements may suggest ACS but many other  chronic and acute conditions are known to elevate hsTnI results.  Refer to the "Links" section for chest pain algorithms and additional  guidance. Performed at [facility], [address] [place], [place] [address]   CBC with Differential     Status: Abnormal   Collection Time: 01/29/23  2:21 PM  Result Value Ref Range   WBC 26.5 (H) 4.0 - 10.5 K/uL   RBC 4.21 3.87 - 5.11 MIL/uL   Hemoglobin 12.3 12.0 - 15.0 g/dL   HCT 96.2 95.2 - 84.1 %   MCV 89.8 80.0 - 100.0 fL   MCH 29.2 26.0 - 34.0 pg   MCHC 32.5 30.0 - 36.0 g/dL   RDW 32.4 40.1 - 02.7 %   Platelets 218 150 - 400 K/uL   nRBC 0.0 0.0 - 0.2 %   Neutrophils Relative % 89 %   Neutro Abs 23.3 (H) 1.7 - 7.7 K/uL   Lymphocytes Relative 4 %   Lymphs Abs 1.1 0.7 - 4.0 K/uL   Monocytes Relative 5 %   Monocytes Absolute 1.4 (H) 0.1 - 1.0 K/uL   Eosinophils Relative 0 %   Eosinophils Absolute 0.1 0.0 - 0.5 K/uL   Basophils Relative 0 %   Basophils Absolute 0.1 0.0 - 0.1 K/uL   WBC Morphology MORPHOLOGY UNREMARKABLE  Smear Review MORPHOLOGY UNREMARKABLE    Immature Granulocytes 2 %   Abs Immature Granulocytes 0.58 (H) 0.00 - 0.07 K/uL   Polychromasia PRESENT    Stomatocytes PRESENT     Comment: Performed at [facility], [address], [place], [place] [address]  Brain natriuretic peptide     Status: Abnormal   Collection Time: 01/29/23  2:21  PM  Result Value Ref Range   B Natriuretic Peptide 128.0 (H) 0.0 - 100.0 pg/mL    Comment: Performed at [facility], [address] [place], [place] [address]  Troponin I (High Sensitivity)     Status: None   Collection Time: 01/29/23  3:46 PM  Result Value Ref Range   Troponin I (High Sensitivity) 4 <18 ng/L    Comment: (NOTE) Elevated high sensitivity troponin I (hsTnI) values and significant  changes across serial measurements may suggest ACS but many other  chronic and acute conditions are known to elevate hsTnI results.  Refer to the "Links" section for chest pain algorithms and additional  guidance. Performed at [facility], [address] [place], [place] [address]    *Note: Due to a large number of results and/or encounters for the requested time period, some results have not been displayed. A complete set of results can be found in Results Review.   CT Angio Chest PE W and/or Wo Contrast  Result Date: 01/29/2023 CLINICAL DATA:  Left lower chest pain since yesterday with shortness of breath. EXAM: CT ANGIOGRAPHY CHEST WITH CONTRAST TECHNIQUE: Multidetector CT imaging of the chest was performed using the standard protocol during bolus administration of intravenous contrast. Multiplanar CT image reconstructions and MIPs were obtained to evaluate the vascular anatomy. RADIATION DOSE REDUCTION: This exam was performed according to the departmental dose-optimization program which includes automated exposure control, adjustment of the mA and/or kV according to patient size and/or use of iterative reconstruction technique. CONTRAST:  75mL OMNIPAQUE IOHEXOL 350 MG/ML SOLN COMPARISON:  Chest CT, lung cancer screening, 05/02/2022. FINDINGS: Cardiovascular: Study mildly limited relative decreased contrast opacification of peripheral pulmonary arteries and some motion artifact. Allowing for this, there is no evidence of a pulmonary embolism. Heart is normal in size and configuration. Left coronary  artery and circumflex coronary artery calcifications. No pericardial effusion. Great vessels are normal in caliber. Mild aortic atherosclerosis. No dissection. Arch branch vessels are widely patent. Mediastinum/Nodes: No neck base, mediastinal or hilar masses. No enlarged lymph nodes. Trachea and esophagus are unremarkable. Lungs/Pleura: Advanced centrilobular emphysema. Patchy airspace opacity along the posterior left upper lobe and peripheral left lower lobe with associated interstitial thickening, new since the prior CT. No other areas of lung consolidation. No pulmonary edema. No pleural effusion or pneumothorax. Upper Abdomen: No acute abnormality. Musculoskeletal: No fracture or acute finding. No suspicious bone lesion. Review of the MIP images confirms the above findings. IMPRESSION: 1. No evidence of a pulmonary embolism. 2. Patchy airspace opacity along the posterior left upper lobe and peripheral left lower lobe with associated interstitial thickening, new since the prior CT. This is consistent with pneumonia. 3. Advanced centrilobular emphysema. 4. Coronary artery calcifications. Aortic Atherosclerosis (ICD10-I70.0) and Emphysema (ICD10-J43.9). Electronically Signed   By: Amie Portland M.D.   On: 01/29/2023 16:45   DG Chest Portable 1 View  Result Date: 01/29/2023 CLINICAL DATA:  Dyspnea.  Chest pain. EXAM: PORTABLE CHEST 1 VIEW COMPARISON:  December 05, 2022. FINDINGS: Stable cardiomediastinal silhouette. Right lung is clear. Mild left basilar opacity is noted concerning for pneumonia or atelectasis with  small left pleural effusion. Bony thorax is unremarkable. IMPRESSION: Mild left basilar opacity is noted concerning for pneumonia or atelectasis with small left pleural effusion. Electronically Signed   By: Lupita Raider M.D.   On: 01/29/2023 15:45    Pending Labs Unresulted Labs (From admission, onward)    None       Vitals/Pain Today's Vitals   01/29/23 1352 01/29/23 1353 01/29/23 1354  01/29/23 1421  BP:      Pulse:   100   Resp:   17   Temp:   98.6 F (37 C)   TempSrc:   Oral   SpO2:   97% 97%  Weight:  86.2 kg    Height:  5\' 8"  (1.727 m)    PainSc: 10-Worst pain ever       Isolation Precautions No active isolations  Medications Medications  azithromycin (ZITHROMAX) 500 mg in sodium chloride 0.9 % 250 mL IVPB (500 mg Intravenous New Bag/Given 01/29/23 1659)  ipratropium-albuterol (DUONEB) 0.5-2.5 (3) MG/3ML nebulizer solution 9 mL (9 mLs Nebulization Given 01/29/23 1421)  methylPREDNISolone sodium succinate (SOLU-MEDROL) 125 mg/2 mL injection 125 mg (125 mg Intravenous Given 01/29/23 1506)  cefTRIAXone (ROCEPHIN) 2 g in sodium chloride 0.9 % 100 mL IVPB (0 g Intravenous Stopped 01/29/23 1656)  iohexol (OMNIPAQUE) 350 MG/ML injection 75 mL (75 mLs Intravenous Contrast Given 01/29/23 1523)    Mobility Can transfer walking but uses mechanical chair at home due to she becomes SOB.      Focused Assessments Cardiac Assessment Handoff:    Lab Results  Component Value Date   CKTOTAL 324 (H) 01/28/2019   CKMB 6.3 (H) 01/28/2019   TROPONINI <0.03 12/29/2017   Lab Results  Component Value Date   DDIMER 0.33 08/14/2020   Does the Patient currently have chest pain? No    R Recommendations: See Admitting Provider Note  Report given to:   Additional Notes: Pt is on 3lpm, she has taken it off at times and oxygen will drop, she takes off her bp because she says it hurts and doesn't want it. Pt is easily irritated. Pt has a hospice nurse that comes to her house.

## 2023-01-29 NOTE — ED Notes (Signed)
Patient transported to CT 

## 2023-01-29 NOTE — ED Notes (Signed)
Pt reporting she had to urinate again. Nurse asked if she urinated 5 minutes ago and pt reported no. Sometimes she has to sit on the toilet for a Goldye Tourangeau time to urinate. 448 in bladder

## 2023-01-29 NOTE — ED Provider Notes (Signed)
Patient initially seen by Dr. Elias Else.  Please see his note.  Patient's presentation is suggestive of an acute COPD exacerbation.  Did have a CT angiogram that does not show any evidence of pulmonary embolism.  There are findings suggestive of pneumonia.  Case discussed with Dr Randol Kern regarding admission   Linwood Dibbles, MD 01/31/23 [phone]

## 2023-01-30 ENCOUNTER — Encounter (HOSPITAL_COMMUNITY): Payer: Self-pay | Admitting: Internal Medicine

## 2023-01-30 DIAGNOSIS — Z515 Encounter for palliative care: Secondary | ICD-10-CM | POA: Diagnosis not present

## 2023-01-30 DIAGNOSIS — Z7189 Other specified counseling: Secondary | ICD-10-CM | POA: Diagnosis not present

## 2023-01-30 DIAGNOSIS — J189 Pneumonia, unspecified organism: Secondary | ICD-10-CM | POA: Diagnosis not present

## 2023-01-30 LAB — CBC
HCT: 36.1 % (ref 36.0–46.0)
Hemoglobin: 11.7 g/dL — ABNORMAL LOW (ref 12.0–15.0)
MCH: 29 pg (ref 26.0–34.0)
MCHC: 32.4 g/dL (ref 30.0–36.0)
MCV: 89.4 fL (ref 80.0–100.0)
Platelets: 203 10*3/uL (ref 150–400)
RBC: 4.04 MIL/uL (ref 3.87–5.11)
RDW: 15.2 % (ref 11.5–15.5)
WBC: 18.3 10*3/uL — ABNORMAL HIGH (ref 4.0–10.5)
nRBC: 0 % (ref 0.0–0.2)

## 2023-01-30 LAB — BASIC METABOLIC PANEL
Anion gap: 11 (ref 5–15)
BUN: 12 mg/dL (ref 8–23)
CO2: 27 mmol/L (ref 22–32)
Calcium: 9 mg/dL (ref 8.9–10.3)
Chloride: 95 mmol/L — ABNORMAL LOW (ref 98–111)
Creatinine, Ser: 0.71 mg/dL (ref 0.44–1.00)
GFR, Estimated: >60 mL/min (ref >60)
Glucose, Bld: 280 mg/dL — ABNORMAL HIGH (ref 70–99)
Potassium: 3.1 mmol/L — ABNORMAL LOW (ref 3.5–5.1)
Sodium: 133 mmol/L — ABNORMAL LOW (ref 135–145)

## 2023-01-30 MED ORDER — TAMSULOSIN HCL 0.4 MG PO CAPS
0.4000 mg | ORAL_CAPSULE | Freq: Every day | ORAL | Status: DC
  Administered 2023-01-30 – 2023-02-02 (×4): 0.4 mg via ORAL
  Filled 2023-01-30 (×4): qty 1

## 2023-01-30 MED ORDER — GUAIFENESIN-DM 100-10 MG/5ML PO SYRP
10.0000 mL | ORAL_SOLUTION | Freq: Three times a day (TID) | ORAL | Status: DC
  Administered 2023-01-30 – 2023-02-02 (×9): 10 mL via ORAL
  Filled 2023-01-30 (×9): qty 10

## 2023-01-30 MED ORDER — AZITHROMYCIN 250 MG PO TABS
500.0000 mg | ORAL_TABLET | Freq: Every day | ORAL | Status: DC
  Administered 2023-01-30 – 2023-01-31 (×2): 500 mg via ORAL
  Filled 2023-01-30 (×2): qty 2

## 2023-01-30 MED ORDER — ALBUTEROL SULFATE (2.5 MG/3ML) 0.083% IN NEBU
2.5000 mg | INHALATION_SOLUTION | Freq: Three times a day (TID) | RESPIRATORY_TRACT | Status: DC
  Administered 2023-01-30 – 2023-02-02 (×11): 2.5 mg via RESPIRATORY_TRACT
  Filled 2023-01-30 (×11): qty 3

## 2023-01-30 MED ORDER — POTASSIUM CHLORIDE CRYS ER 20 MEQ PO TBCR
40.0000 meq | EXTENDED_RELEASE_TABLET | Freq: Once | ORAL | Status: AC
  Administered 2023-01-30: 40 meq via ORAL
  Filled 2023-01-30: qty 2

## 2023-01-30 NOTE — Plan of Care (Signed)
  Problem: Education: Goal: Knowledge of General Education information will improve Description: Including pain rating scale, medication(s)/side effects and non-pharmacologic comfort measures Outcome: Progressing   Problem: Nutrition: Goal: Adequate nutrition will be maintained Outcome: Progressing   Problem: Coping: Goal: Level of anxiety will decrease Outcome: Progressing   Problem: Pain Management: Goal: General experience of comfort will improve Outcome: Progressing   Problem: Safety: Goal: Ability to remain free from injury will improve Outcome: Progressing

## 2023-01-30 NOTE — Progress Notes (Signed)
PROGRESS NOTE    Patient: Joan [name]                            PCP: Myrlene Broker, MD                    DOB: 1960/06/19            DOA: 01/29/2023 [id number]             DOS: 01/30/2023, 11:12 AM   LOS: 1 day   Date of Service: The patient was seen and examined on 01/30/2023  Subjective:   The patient was seen and examined this morning. Hemodynamically stable. Needing 5 L of oxygen, currently satting 96% No issues overnight .  Brief Narrative:   Joan [name]  is a 62 y.o. female,  with medical history significant of terminal COPD on hospice care, hypertension, hyperlipidemia, T2DM, OSA on CPAP who presented to the emergency department secondary to complaints of shortness of breath and chest pain, patient reports pleuritic chest pain, in the right chest area, worsened by deep breathing, and activity, as well she does report progressive dyspnea, no nausea, no vomiting, no headache, no fever, she does report cough at baseline, as well reports hoarseness in her voice at baseline, she denies any sick contact, reports has been feeling worse over last 24 hours which prompted her to come to ED, she is on 3.5 L nasal cannula at baseline, reports she is still smoking. ED: O2 demand 5 L, hemodynamically stable EKG with no acute findings, troponins negative x 2, CTA chest was negative for PE but with evidence of pneumonia, she had leukocytosis at 26K, but lactic acid within normal limit, she had  wheezing on presentation, she was started on IV steroids, IV antibiotics, she had urinary retention with bladder scan more than 450 cc.    Assessment & Plan:     Principal Problem:   Pneumonia Active Problems:   Chronic respiratory failure with hypoxia (HCC)   Tobacco use disorder   Anxiety   Vocal cord dysfunction   Acquired hypothyroidism   Chronic obstructive pulmonary disease (HCC)   COPD exacerbation (HCC)   Fall at home, initial encounter     Chronic respiratory failure  with hypoxia/  COPD with acute exacerbation Community-acquired pneumonia -Baseline O2 demand 3.5 L, currently on 5 L of oxygen, satting 97% POA: Shortness of breath with pleuritic chest pain  -PA chest negative for PE but significant for up pneumonia -Treating pneumonia with IV antibiotics, - Follow-up blood cultures, urine Legionella antigen, strep pneumonia antigen -Continue with IV Rocephin and azithromycin -She was encouraged use incentive spirometry and flutter valve -Continue with IV steroids -Continue with as needed nebs -Continue with home Singulair and Breztri   Pleuritic chest pain -Improved -Secondary to cough up pneumonia, not typical, EKG nonacute, troponins negative x 2, CTA chest negative for PE   OSA on CPAP - Continue CPAP at bedtime   Type 2 diabetes with neuropathy -Does not appear to be on any home medications -Will keep on sliding scale during hospital stay given she is on IV steroids   Essential hypertension Continue atenolol, and hydrochlorothiazide As needed hydralazine   Mixed hyperlipidemia Continue Lipitor   Acquired hypothyroidism Continue Synthroid   GERD Continue Protonix   Diabetic neuropathy -Continue with home medication including Lyrica   Anxiety/depression -Continue with home medication   Urinary retention -bladder scan in ED more than  450, will do one-time in and out, will check UA.  Will monitor bladder scan every 8 hours   Tobacco abuse -Still smoking, will start on nicotine patch  -------------------------------------------------------------------------------------------------------------------------------------- Nutritional status:  The patient's BMI is: Body mass index is 28.89 kg/m. I agree with the assessment and plan as outlined below: Nutrition Status:       Skin Assessment: I have examined the patient's skin and I agree with the wound assessment as performed by wound care team As outlined belowe: Pressure  Injury 08/15/20 Deep Tissue Pressure Injury - Purple or maroon localized area of discolored intact skin or blood-filled blister due to damage of underlying soft tissue from pressure and/or shear. (Active)  08/15/20   Location:   Location Orientation:   Staging: Deep Tissue Pressure Injury - Purple or maroon localized area of discolored intact skin or blood-filled blister due to damage of underlying soft tissue from pressure and/or shear.  Wound Description (Comments):   Present on Admission: Yes   -------------------------------------------------------------------------------------------------------------------------------------------  DVT prophylaxis:  enoxaparin (LOVENOX) injection 40 mg Start: 01/30/23 1000   Code Status:   Code Status: Do not attempt resuscitation (DNR) PRE-ARREST INTERVENTIONS DESIRED  Family Communication: No family member present at bedside- attempt will be made to update daily -Advance care planning has been discussed.   Admission status:   Status is: Inpatient Remains inpatient appropriate because: Needing respiratory support, nebs, IV steroids,   Disposition: From  - home             Planning for discharge in 1-2 days: to Home   Procedures:   No admission procedures for hospital encounter.   Antimicrobials:  Anti-infectives (From admission, onward)    Start     Dose/Rate Route Frequency Ordered Stop   01/30/23 1600  cefTRIAXone (ROCEPHIN) 2 g in sodium chloride 0.9 % 100 mL IVPB        2 g 200 mL/hr over 30 Minutes Intravenous Every 24 hours 01/29/23 1735 02/04/23 1559   01/30/23 1600  azithromycin (ZITHROMAX) 500 mg in sodium chloride 0.9 % 250 mL IVPB  Status:  Discontinued        500 mg 250 mL/hr over 60 Minutes Intravenous Every 24 hours 01/29/23 1735 01/29/23 1736   01/30/23 1000  azithromycin (ZITHROMAX) tablet 500 mg        500 mg Oral Daily 01/30/23 0709     01/29/23 1445  cefTRIAXone (ROCEPHIN) 2 g in sodium chloride 0.9 % 100 mL IVPB         2 g 200 mL/hr over 30 Minutes Intravenous  Once 01/29/23 1443 01/29/23 1656   01/29/23 1445  azithromycin (ZITHROMAX) 500 mg in sodium chloride 0.9 % 250 mL IVPB  Status:  Discontinued        500 mg 250 mL/hr over 60 Minutes Intravenous Every 24 hours 01/29/23 1443 01/30/23 0709        Medication:   albuterol  2.5 mg Nebulization TID   atenolol  25 mg Oral Daily   atorvastatin  20 mg Oral Daily   azithromycin  500 mg Oral Daily   enoxaparin (LOVENOX) injection  40 mg Subcutaneous Q24H   famotidine  20 mg Oral Daily   guaiFENesin-dextromethorphan  10 mL Oral Q8H   hydrochlorothiazide  25 mg Oral Daily   levothyroxine  25 mcg Oral QAC breakfast   methylPREDNISolone (SOLU-MEDROL) injection  80 mg Intravenous Q12H   mometasone-formoterol  2 puff Inhalation BID   And   umeclidinium bromide  1 puff  Inhalation Daily   montelukast  10 mg Oral QHS   nicotine  21 mg Transdermal Daily   pantoprazole  40 mg Oral Daily   pregabalin  300 mg Oral BID   QUEtiapine  100 mg Oral QHS   venlafaxine XR  150 mg Oral Q breakfast    albuterol, diazepam, hydrALAZINE, melatonin   Objective:   Vitals:   01/29/23 2002 01/29/23 2357 01/30/23 0627 01/30/23 0800  BP:  109/72 110/66   Pulse:  92 68   Resp:  (!) 22 20   Temp:  (!) 97.5 F (36.4 C) (!) 97.4 F (36.3 C)   TempSrc:  Oral Oral   SpO2: 92% 97%  96%  Weight:      Height:       No intake or output data in the 24 hours ending 01/30/23 1112 Filed Weights   01/29/23 1353  Weight: 86.2 kg     Physical examination:   Constitution:  Alert, cooperative, no distress,  Appears calm and comfortable  Psychiatric:   Normal and stable mood and affect, cognition intact,   HEENT:        Normocephalic, PERRL, otherwise with in Normal limits  Chest:         Chest symmetric Cardio vascular:  S1/S2, RRR, No murmure, No Rubs or Gallops  pulmonary: Diffuse wheezing/rhonchi -positive air sounds negative for any crackles Abdomen: Soft,  non-tender, non-distended, bowel sounds,no masses, no organomegaly Muscular skeletal: Limited exam - in bed, able to move all 4 extremities,   Neuro: CNII-XII intact. , normal motor and sensation, reflexes intact  Extremities: No pitting edema lower extremities, +2 pulses  Skin: Dry, warm to touch, negative for any Rashes, No open wounds Wounds: per nursing documentation   ------------------------------------------------------------------------------------------------------------------------------------------    LABs:     Latest Ref Rng & Units 01/30/2023    4:19 AM 01/29/2023    2:21 PM 12/06/2022    4:43 AM  CBC  WBC 4.0 - 10.5 K/uL 18.3  26.5  11.4   Hemoglobin 12.0 - 15.0 g/dL 16.1  09.6  04.5   Hematocrit 36.0 - 46.0 % 36.1  37.8  40.1   Platelets 150 - 400 K/uL 203  218  271       Latest Ref Rng & Units 01/30/2023    4:19 AM 01/29/2023    2:21 PM 12/06/2022    4:43 AM  CMP  Glucose 70 - 99 mg/dL 409  811  914   BUN 8 - 23 mg/dL 12  9  10    Creatinine 0.44 - 1.00 mg/dL 7.82  9.56  2.13   Sodium 135 - 145 mmol/L 133  135  130   Potassium 3.5 - 5.1 mmol/L 3.1  3.5  3.8   Chloride 98 - 111 mmol/L 95  94  90   CO2 22 - 32 mmol/L 27  33  30   Calcium 8.9 - 10.3 mg/dL 9.0  9.3  8.5   Total Protein 6.5 - 8.1 g/dL  6.9  6.6   Total Bilirubin <1.2 mg/dL  0.8  0.4   Alkaline Phos 38 - 126 U/L  107  120   AST 15 - 41 U/L  12  19   ALT 0 - 44 U/L  14  17        Micro Results Recent Results (from the past 240 hour(s))  Culture, blood (Routine X 2) w Reflex to ID Panel     Status: None (Preliminary result)  Collection Time: 01/29/23  6:18 PM   Specimen: BLOOD  Result Value Ref Range Status   Specimen Description BLOOD LEFT ANTECUBITAL  Final   Special Requests   Final    BOTTLES DRAWN AEROBIC AND ANAEROBIC Blood Culture adequate volume   Culture   Final    NO GROWTH < 24 HOURS Performed at [facility], [address], [place], [place] [address]    Report Status PENDING   Incomplete  Culture, blood (Routine X 2) w Reflex to ID Panel     Status: None (Preliminary result)   Collection Time: 01/29/23  6:18 PM   Specimen: BLOOD  Result Value Ref Range Status   Specimen Description BLOOD BLOOD RIGHT HAND  Final   Special Requests   Final    BOTTLES DRAWN AEROBIC ONLY Blood Culture results may not be optimal due to an inadequate volume of blood received in culture bottles   Culture   Final    NO GROWTH < 24 HOURS Performed at [facility], [address] [place], [place] [address]    Report Status PENDING  Incomplete    Radiology Reports CT Angio Chest PE W and/or Wo Contrast  Result Date: 01/29/2023 CLINICAL DATA:  Left lower chest pain since yesterday with shortness of breath. EXAM: CT ANGIOGRAPHY CHEST WITH CONTRAST TECHNIQUE: Multidetector CT imaging of the chest was performed using the standard protocol during bolus administration of intravenous contrast. Multiplanar CT image reconstructions and MIPs were obtained to evaluate the vascular anatomy. RADIATION DOSE REDUCTION: This exam was performed according to the departmental dose-optimization program which includes automated exposure control, adjustment of the mA and/or kV according to patient size and/or use of iterative reconstruction technique. CONTRAST:  75mL OMNIPAQUE IOHEXOL 350 MG/ML SOLN COMPARISON:  Chest CT, lung cancer screening, 05/02/2022. FINDINGS: Cardiovascular: Study mildly limited relative decreased contrast opacification of peripheral pulmonary arteries and some motion artifact. Allowing for this, there is no evidence of a pulmonary embolism. Heart is normal in size and configuration. Left coronary artery and circumflex coronary artery calcifications. No pericardial effusion. Great vessels are normal in caliber. Mild aortic atherosclerosis. No dissection. Arch branch vessels are widely patent. Mediastinum/Nodes: No neck base, mediastinal or hilar masses. No enlarged lymph nodes. Trachea and  esophagus are unremarkable. Lungs/Pleura: Advanced centrilobular emphysema. Patchy airspace opacity along the posterior left upper lobe and peripheral left lower lobe with associated interstitial thickening, new since the prior CT. No other areas of lung consolidation. No pulmonary edema. No pleural effusion or pneumothorax. Upper Abdomen: No acute abnormality. Musculoskeletal: No fracture or acute finding. No suspicious bone lesion. Review of the MIP images confirms the above findings. IMPRESSION: 1. No evidence of a pulmonary embolism. 2. Patchy airspace opacity along the posterior left upper lobe and peripheral left lower lobe with associated interstitial thickening, new since the prior CT. This is consistent with pneumonia. 3. Advanced centrilobular emphysema. 4. Coronary artery calcifications. Aortic Atherosclerosis (ICD10-I70.0) and Emphysema (ICD10-J43.9). Electronically Signed   By: Amie Portland M.D.   On: 01/29/2023 16:45   DG Chest Portable 1 View  Result Date: 01/29/2023 CLINICAL DATA:  Dyspnea.  Chest pain. EXAM: PORTABLE CHEST 1 VIEW COMPARISON:  December 05, 2022. FINDINGS: Stable cardiomediastinal silhouette. Right lung is clear. Mild left basilar opacity is noted concerning for pneumonia or atelectasis with small left pleural effusion. Bony thorax is unremarkable. IMPRESSION: Mild left basilar opacity is noted concerning for pneumonia or atelectasis with small left pleural effusion. Electronically Signed   By: Roque Lias  Jr M.D.   On: 01/29/2023 15:45    SIGNED: Kendell Bane, MD, FHM. FAAFP. [facility] - [facility] Time spent - 55 min.  In seeing, evaluating and examining the patient. Reviewing medical records, labs, drawn plan of care. [facility],  Pager (please use [email or web address] to page/ text) Please use Epic Secure Chat for non-urgent communication (7AM-7PM)  If 7PM-7AM, please contact night-coverage [email or web address], 01/30/2023, 11:12 AM

## 2023-01-30 NOTE — Hospital Course (Addendum)
Joan [name]  is a 62 y.o. female,  with medical history significant of terminal COPD on hospice care, hypertension, hyperlipidemia, T2DM, OSA on CPAP who presented to the emergency department secondary to complaints of shortness of breath and chest pain, patient reports pleuritic chest pain, in the right chest area, worsened by deep breathing, and activity, as well she does report progressive dyspnea, no nausea, no vomiting, no headache, no fever, she does report cough at baseline, as well reports hoarseness in her voice at baseline, she denies any sick contact, reports has been feeling worse over last 24 hours which prompted her to come to ED, she is on 3.5 L nasal cannula at baseline, reports she is still smoking. ED: O2 demand 5 L, hemodynamically stable EKG with no acute findings, troponins negative x 2, CTA chest was negative for PE but with evidence of pneumonia, she had leukocytosis at 26K, but lactic acid within normal limit, she had  wheezing on presentation, she was started on IV steroids, IV antibiotics, she had urinary retention with bladder scan more than 450 cc.    Assessment & Plan:     Principal Problem:   Pneumonia Active Problems:   Chronic respiratory failure with hypoxia (HCC)   Tobacco use disorder   Anxiety   Vocal cord dysfunction   Acquired hypothyroidism   Chronic obstructive pulmonary disease (HCC)   COPD exacerbation (HCC)   Fall at home, initial encounter     Chronic respiratory failure with hypoxia/  COPD with acute exacerbation Community-acquired pneumonia -Baseline O2 demand 3.5 L,  Currently at 4.5 L, satting 97%  POA: Shortness of breath with pleuritic chest pain Still having shortness of breath, cough  -PA chest negative for PE but significant for up pneumonia -Treating pneumonia with IV antibiotics, -Cultures no growth to date -Continue with IV Rocephin and azithromycin  -She was encouraged use incentive spirometry and flutter valve -Continue with  IV steroids -Continue with as needed nebs -Continue with home Singulair and Breztri   Pleuritic chest pain -Intermittent, improving -Secondary to cough up pneumonia, not typical, EKG nonacute, troponins negative x 2, CTA chest negative for PE   OSA on CPAP - Continue CPAP at bedtime   Type 2 diabetes with neuropathy -Does not appear to be on any home medications -Will keep on sliding scale during hospital stay given she is on IV steroids   Essential hypertension -Blood pressure soft continue atenolol, holding HCTZ Pulm meds: atenolol, and hydrochlorothiazide As needed hydralazine   Mixed hyperlipidemia Continue Lipitor   Acquired hypothyroidism Continue Synthroid   GERD Continue Protonix   Diabetic neuropathy -Continue with home medication including Lyrica   Anxiety/depression -Continue with home medication   Urinary retention -bladder scan in ED more than 450, will do one-time in and out,  -UA unremarkable  Will monitor bladder scan every 8 hours Initiating Flomax   Tobacco abuse -Still smoking, will start on nicotine patch

## 2023-01-30 NOTE — Progress Notes (Signed)
Patient placed on auto cpap with nasal mask and 5L oxygen.  Patient tolerating well at this time.

## 2023-01-30 NOTE — Consult Note (Signed)
Consultation Note Date: 01/30/2023   Patient Name: Joan [name]  DOB: 02/13/1961  MRN: [id number]  Age / Sex: 62 y.o., female  PCP: Myrlene Broker, MD Referring Physician: Kendell Bane, MD  Reason for Consultation: Establishing goals of care  HPI/Patient Profile: 62 y.o. female  with past medical history of terminal COPD/5L O2 on hospice care with [facility], HTN/HLD, DM2, OSA on CPAP, admitted on 01/29/2023 with chronic respiratory failure with hypoxia/COPD exacerbation and also community-acquired pneumonia.  Chest CT negative for PE but significant for pneumonia  Clinical Assessment and Goals of Care: I have reviewed medical records including EPIC notes, labs and imaging, received report from RN, assessed the patient.  Joan [name] is lying quietly in bed.  She appears acutely/chronically ill and frail.  She is resting comfortably but will interact with me on a minimal level.  She will briefly make but not keep eye contact.  She is able to make her needs known.  There is no family at bedside at this time.  We then met at the bedside to discuss diagnosis prognosis, GOC, EOL wishes, disposition and options.  I introduced Palliative Medicine as specialized medical care for people living with serious illness. It focuses on providing relief from the symptoms and stress of a serious illness. The goal is to improve quality of life for both the patient and the family.  We then focused on their current illness. We talk about her acute pneumonia and the treatment plan, IV antibiotics, steroid injections.  WBC are improving from 26 to 18. We talk about time for outcomes. The natural disease trajectory and expectations at EOL were discussed.  Advanced directives, concepts specific to code status, artifical feeding and hydration, and rehospitalization were considered and discussed.  Historically Joan [name] has  chosen to be full scope/full code.  She has been active with [facility] hospice care services and has elected during this hospital stay to "treat the treatable, but allow a natural passing".  Hospice Care services outpatient were explained and offered.  Joan [name] is active with [facility] at home "treat the treatable" hospice care.    Discussed the importance of continued conversation with family and the medical providers regarding overall plan of care and treatment options, ensuring decisions are within the context of the patient's values and GOCs. Questions and concerns were addressed.  The family was encouraged to call with questions or concerns.  PMT will continue to support holistically.  Conference with attending, bedside nursing staff, transition of care team related to patient condition, needs, goals of care, disposition.   HCPOA  HCPOA -daughter, Joan [name] is legal healthcare power of attorney.  Paperwork found in ACP tab of epic chart.    SUMMARY OF RECOMMENDATIONS   Continue to treat but no CPR or intubation  Time for outcomes Anticipate return home with [facility] hospice services already in place.    Code Status/Advance Care Planning: DNR   Symptom Management:  Per hospitalist, no additional needs at this time.  Palliative Prophylaxis:  Frequent Pain Assessment and Oral Care  Additional Recommendations (Limitations, Scope, Preferences): Continue to treat but no CPR or intubation, return home with hospice already in place  Psycho-social/Spiritual:  Desire for further Chaplaincy support:no Additional Recommendations: Caregiving  Support/Resources and Education on Hospice  Prognosis:  Unable to determine -based on outcomes.  Prognostication for end-stage COPD can be difficult.  6 months or less would not be surprising based on frailty, decreasing functional status, 5 hospital admits in the last 6 months.  Discharge Planning: Anticipate home with [facility] hospice services  already in place      Primary Diagnoses: Present on Admission:  Pneumonia  Acquired hypothyroidism  Anxiety  Chronic obstructive pulmonary disease (HCC)  Chronic respiratory failure with hypoxia (HCC)  COPD exacerbation (HCC)  Tobacco use disorder  Vocal cord dysfunction   I have reviewed the medical record, interviewed the patient and family, and examined the patient. The following aspects are pertinent.  Past Medical History:  Diagnosis Date   Acute respiratory failure with hypoxia (HCC)    Alcoholism and drug addiction in family    Anxiety    Appendicitis    Arthritis    "knees, hips, lower back" (09/25/2016)   Asthma    Bipolar disorder (HCC)    Chronic bronchitis (HCC)    "all the time" (09/25/2016)   Chronic leg pain    RLE   Chronic lower back pain    COPD (chronic obstructive pulmonary disease) (HCC)    Depression    Diabetes mellitus without complication (HCC)    Diabetic neuropathy (HCC)    Early cataracts, bilateral    Emphysema of lung (HCC)    Generalized anxiety disorder 02/09/2020   GERD (gastroesophageal reflux disease)    Headache    Hepatitis C    "treated back in 2001-2002"   High cholesterol    Hypertension    Hypothyroidism    Incarcerated ventral hernia 04/04/2017   Mood disorder in conditions classified elsewhere 02/09/2020   On home oxygen therapy    "2L; at nighttime" (04/04/2017)   OSA on CPAP    CPAP with Oxygen 3 liters   Paranoid (HCC)    Pneumonia    "all the time" (09/25/2016)   Positive PPD    with negative chest x ray   PTSD (post-traumatic stress disorder) 02/09/2020   Tachycardia    patient reports with exertion ; denies any other conditions    Thyroid disease    Wears glasses    Wears partial dentures    Social History   Socioeconomic History   Marital status: Widowed    Spouse name: Not on file   Number of children: 3   Years of education: Not on file   Highest education level: 9th grade  Occupational History   Not  on file  Tobacco Use   Smoking status: Former    Current packs/day: 2.00    Average packs/day: 2.0 packs/day for 47.9 years (95.9 ttl pk-yrs)    Types: Cigarettes    Start date: 1977    Passive exposure: Current   Smokeless tobacco: Never   Tobacco comments:    1ppd as of 07/30/22 ep  Vaping Use   Vaping status: Never Used  Substance and Sexual Activity   Alcohol use: Not Currently   Drug use: Not Currently    Types: "Crack" cocaine    Comment:  "nothing since 01/12/2012"   Sexual activity: Not Currently    Partners: Male  Other Topics Concern  Not on file  Social History Narrative   Not on file   Social Determinants of Health   Financial Resource Strain: High Risk (08/30/2022)   Overall Financial Resource Strain (CARDIA)    Difficulty of Paying Living Expenses: Hard  Food Insecurity: No Food Insecurity (12/06/2022)   Hunger Vital Sign    Worried About Running Out of Food in the Last Year: Never true    Ran Out of Food in the Last Year: Never true  Transportation Needs: No Transportation Needs (12/06/2022)   PRAPARE - Administrator, Civil Service (Medical): No    Lack of Transportation (Non-Medical): No  Physical Activity: Inactive (08/30/2022)   Exercise Vital Sign    Days of Exercise per Week: 0 days    Minutes of Exercise per Session: 0 min  Stress: No Stress Concern Present (08/30/2022)   [employer] of Occupational Health - Occupational Stress Questionnaire    Feeling of Stress : Only a little  Recent Concern: Stress - Stress Concern Present (08/12/2022)   [employer] of Occupational Health - Occupational Stress Questionnaire    Feeling of Stress : Very much  Social Connections: Moderately Isolated (08/30/2022)   Social Connection and Isolation Panel [NHANES]    Frequency of Communication with Friends and Family: Once a week    Frequency of Social Gatherings with Friends and Family: Once a week    Attends Religious Services: 1 to 4 times per year     Active Member of [employer] or Organizations: Yes    Attends Banker Meetings: 1 to 4 times per year    Marital Status: Widowed   Family History  Problem Relation Age of Onset   Diabetes Mother    Hypertension Mother    Hypertension Father    Cancer Father        lung   Breast cancer Neg Hx    Scheduled Meds:  albuterol  2.5 mg Nebulization TID   atenolol  25 mg Oral Daily   atorvastatin  20 mg Oral Daily   azithromycin  500 mg Oral Daily   enoxaparin (LOVENOX) injection  40 mg Subcutaneous Q24H   famotidine  20 mg Oral Daily   hydrochlorothiazide  25 mg Oral Daily   levothyroxine  25 mcg Oral QAC breakfast   methylPREDNISolone (SOLU-MEDROL) injection  80 mg Intravenous Q12H   mometasone-formoterol  2 puff Inhalation BID   And   umeclidinium bromide  1 puff Inhalation Daily   montelukast  10 mg Oral QHS   nicotine  21 mg Transdermal Daily   pantoprazole  40 mg Oral Daily   pregabalin  300 mg Oral BID   QUEtiapine  100 mg Oral QHS   venlafaxine XR  150 mg Oral Q breakfast   Continuous Infusions:  cefTRIAXone (ROCEPHIN)  IV     PRN Meds:.albuterol, diazepam, hydrALAZINE, melatonin Medications Prior to Admission:  Prior to Admission medications   Medication Sig Start Date End Date Taking? Authorizing Provider  Budeson-Glycopyrrol-Formoterol (BREZTRI AEROSPHERE) 160-9-4.8 MCG/ACT AERO Inhale 2 puffs into the lungs in the morning and at bedtime. 01/08/23  Yes Oretha Milch, MD  diazepam (VALIUM) 5 MG tablet Take 1 tablet (5 mg total) by mouth every 6 (six) hours as needed for anxiety. 12/07/22  Yes Johnson, Clanford L, MD  famotidine (PEPCID) 20 MG tablet Take 20 mg by mouth daily. 09/06/22  Yes [provider]  furosemide (LASIX) 20 MG tablet Take 20 mg by mouth daily as needed.  09/16/22  Yes [provider]  hydrochlorothiazide (HYDRODIURIL) 25 MG tablet Take 25 mg by mouth daily. 01/01/23  Yes [provider]  Ipratropium-Albuterol  (COMBIVENT RESPIMAT) 20-100 MCG/ACT AERS respimat Inhale 1 puff into the lungs every 6 (six) hours as needed for wheezing or shortness of breath. 04/02/22  Yes Bevelyn Ngo, NP  ACCU-CHEK GUIDE test strip USE TO CHECK BLOOD SUGAR IN THE  MORNING, AT NOON, AND AT BEDTIME 12/13/22   Myrlene Broker, MD  amLODipine (NORVASC) 5 MG tablet Take 1 tablet (5 mg total) by mouth daily. 10/11/22 01/09/23  Narda Bonds, MD  atenolol (TENORMIN) 25 MG tablet TAKE 1 TABLET BY MOUTH DAILY 09/18/22   Myrlene Broker, MD  atorvastatin (LIPITOR) 20 MG tablet TAKE 1 TABLET BY MOUTH ONCE  DAILY 07/23/22   Myrlene Broker, MD  buPROPion (WELLBUTRIN XL) 300 MG 24 hr tablet TAKE 1 TABLET BY MOUTH DAILY 06/04/22   Thresa Ross, MD  fluticasone (FLONASE) 50 MCG/ACT nasal spray Place 2 sprays into both nostrils daily as needed for allergies. 12/07/22 03/07/23  Johnson, Clanford L, MD  guaiFENesin-Codeine 225-7.5 MG/5ML LIQD Take 5 mLs by mouth 3 (three) times daily as needed. 07/30/22   Oretha Milch, MD  levothyroxine (SYNTHROID) 25 MCG tablet TAKE 1 TABLET BY MOUTH DAILY  BEFORE BREAKFAST Patient taking differently: Take 25 mcg by mouth daily before breakfast. 07/23/22   Myrlene Broker, MD  MELATONIN PO Take 5 mg by mouth at bedtime as needed (sleep).    [provider]  montelukast (SINGULAIR) 10 MG tablet TAKE 1 TABLET BY MOUTH AT  BEDTIME Patient taking differently: Take 10 mg by mouth at bedtime. 07/23/22   Oretha Milch, MD  naproxen (NAPROSYN) 500 MG tablet Take 1 tablet (500 mg total) by mouth 2 (two) times daily with a meal. Patient taking differently: Take 500 mg by mouth daily as needed for mild pain or headache. 03/29/22   Myrlene Broker, MD  OXYGEN Inhale 3 L/min into the lungs See admin instructions. Inhale  3 L/min of oxygen into the lungs w/CPAP at bedtime and as needed for shortness of breath with "strenuous activity" during the day    [provider]  pantoprazole  (PROTONIX) 40 MG tablet TAKE 1 TABLET BY MOUTH DAILY 12/16/22   Myrlene Broker, MD  pregabalin (LYRICA) 300 MG capsule TAKE 1 CAPSULE BY MOUTH TWICE  DAILY 11/25/22   Myrlene Broker, MD  QUEtiapine (SEROQUEL) 100 MG tablet TAKE 1 TABLET BY MOUTH AT  BEDTIME 07/31/22   Thresa Ross, MD  roflumilast (DALIRESP) 500 MCG TABS tablet Take 1 tablet (500 mcg total) by mouth daily. 04/02/22   Bevelyn Ngo, NP  silver sulfADIAZINE (SILVADENE) 1 % cream Apply 1 Application topically daily. 10/09/22   Felecia Shelling, DPM  venlafaxine XR (EFFEXOR-XR) 150 MG 24 hr capsule TAKE 1 CAPSULE BY MOUTH DAILY  WITH BREAKFAST 12/16/22   Myrlene Broker, MD  witch hazel-glycerin (TUCKS) pad Apply topically as needed for itching. Patient taking differently: Apply 1 Application topically daily as needed for itching. 08/18/20   Standley Brooking, MD   Allergies  Allergen Reactions   Keflex [Cephalexin] Rash and Other (See Comments)    Has tolerated amoxicillin since had keflex   Shellfish Allergy Shortness Of Breath, Nausea And Vomiting and Other (See Comments)    Stomach cramps   Tetracyclines & Related Anaphylaxis, Swelling and Other (See Comments)    Throat  swelling requiring hospitalization   Dilaudid [Hydromorphone Hcl] Other (See Comments)    "Lethargy"   Prednisone Other (See Comments)    "counter reacts", has tolerated IV medrol   Clindamycin/Lincomycin Other (See Comments)    Burning sensation in her mouth down her throat    Incruse Ellipta [Umeclidinium Bromide] Nausea Only   Molds & Smuts Itching    "Allergic to everything", pt said she is allergic to most environmental allergens   Tuberculin Tests Other (See Comments)    False postive   Umeclidinium     Other Reaction(s): GI Intolerance   Review of Systems  Unable to perform ROS: Other    Physical Exam Vitals and nursing note reviewed.  Constitutional:      General: She is not in acute distress.    Appearance: She is  ill-appearing.     Vital Signs: BP 110/66 (BP Location: Left Arm)   Pulse 68   Temp (!) 97.4 F (36.3 C) (Oral)   Resp 20   Ht 5\' 8"  (1.727 m)   Wt 86.2 kg   SpO2 96%   BMI 28.89 kg/m  Pain Scale: 0-10   Pain Score: 10-Worst pain ever   SpO2: SpO2: 96 % O2 Device:SpO2: 96 % O2 Flow Rate: .O2 Flow Rate (L/min): 5 L/min  IO: Intake/output summary: No intake or output data in the 24 hours ending 01/30/23 0922  LBM:   Baseline Weight: Weight: 86.2 kg Most recent weight: Weight: 86.2 kg     Palliative Assessment/Data:     Time In: 1030   Time Out: 1110 Time Total: 40 minutes  Greater than 50%  of this time was spent counseling and coordinating care related to the above assessment and plan.  Signed by: Katheran Awe, NP   Please contact Palliative Medicine Team phone at [phone] for questions and concerns.  For individual provider: See Loretha Stapler

## 2023-01-30 NOTE — Progress Notes (Addendum)
Mobility Specialist Progress Note:    01/30/23 1345  Mobility  Activity Transferred to/from [facility];Ambulated with assistance in hallway  Level of Assistance Standby assist, set-up cues, supervision of patient - no hands on  Distance Ambulated (ft) 40 ft  Range of Motion/Exercises Active;All extremities  Activity Response Tolerated well  Mobility Referral Yes  Mobility visit 1 Mobility  Mobility Specialist Start Time (ACUTE ONLY) 1330  Mobility Specialist Stop Time (ACUTE ONLY) 1345  Mobility Specialist Time Calculation (min) (ACUTE ONLY) 15 min   Pt received on [facility], eager for mobility. Required SBA to stand and ambulate with RW. Tolerated well, pt c/o chest pain and said it was related to pneumonia. SpO2 92% on 6L during ambulation. Notified nursing staff. Left pt supine, alarm on. All needs met.   [name] Mobility Specialist Please contact via Special educational needs teacher or  Rehab office at [phone]

## 2023-01-31 DIAGNOSIS — J189 Pneumonia, unspecified organism: Secondary | ICD-10-CM | POA: Diagnosis not present

## 2023-01-31 LAB — BASIC METABOLIC PANEL
Anion gap: 11 (ref 5–15)
BUN: 24 mg/dL — ABNORMAL HIGH (ref 8–23)
CO2: 27 mmol/L (ref 22–32)
Calcium: 9.3 mg/dL (ref 8.9–10.3)
Chloride: 98 mmol/L (ref 98–111)
Creatinine, Ser: 0.79 mg/dL (ref 0.44–1.00)
GFR, Estimated: >60 mL/min (ref >60)
Glucose, Bld: 278 mg/dL — ABNORMAL HIGH (ref 70–99)
Potassium: 3.8 mmol/L (ref 3.5–5.1)
Sodium: 136 mmol/L (ref 135–145)

## 2023-01-31 LAB — CBC
HCT: 33.7 % — ABNORMAL LOW (ref 36.0–46.0)
Hemoglobin: 10.9 g/dL — ABNORMAL LOW (ref 12.0–15.0)
MCH: 29.2 pg (ref 26.0–34.0)
MCHC: 32.3 g/dL (ref 30.0–36.0)
MCV: 90.3 fL (ref 80.0–100.0)
Platelets: 219 10*3/uL (ref 150–400)
RBC: 3.73 MIL/uL — ABNORMAL LOW (ref 3.87–5.11)
RDW: 15.2 % (ref 11.5–15.5)
WBC: 16.8 10*3/uL — ABNORMAL HIGH (ref 4.0–10.5)
nRBC: 0 % (ref 0.0–0.2)

## 2023-01-31 MED ORDER — TRAZODONE HCL 50 MG PO TABS
50.0000 mg | ORAL_TABLET | Freq: Every day | ORAL | Status: DC
  Administered 2023-01-31 – 2023-02-01 (×2): 50 mg via ORAL
  Filled 2023-01-31 (×2): qty 1

## 2023-01-31 NOTE — Progress Notes (Signed)
PROGRESS NOTE    Patient: Joan [name]                            PCP: Myrlene Broker, MD                    DOB: 06-05-1960            DOA: 01/29/2023 [id number]:[id number]             DOS: 01/31/2023, 11:08 AM   LOS: 2 days   Date of Service: The patient was seen and examined on 01/31/2023  Subjective:   The patient was seen and examined this morning, sitting on side of bed, still complain shortness of breath, cough Satting 97% on 4.5 L of oxygen Afebrile, mildly hypertensive   Brief Narrative:   Joan [name]  is a 62 y.o. female,  with medical history significant of terminal COPD on hospice care, hypertension, hyperlipidemia, T2DM, OSA on CPAP who presented to the emergency department secondary to complaints of shortness of breath and chest pain, patient reports pleuritic chest pain, in the right chest area, worsened by deep breathing, and activity, as well she does report progressive dyspnea, no nausea, no vomiting, no headache, no fever, she does report cough at baseline, as well reports hoarseness in her voice at baseline, she denies any sick contact, reports has been feeling worse over last 24 hours which prompted her to come to ED, she is on 3.5 L nasal cannula at baseline, reports she is still smoking. ED: O2 demand 5 L, hemodynamically stable EKG with no acute findings, troponins negative x 2, CTA chest was negative for PE but with evidence of pneumonia, she had leukocytosis at 26K, but lactic acid within normal limit, she had  wheezing on presentation, she was started on IV steroids, IV antibiotics, she had urinary retention with bladder scan more than 450 cc.    Assessment & Plan:     Principal Problem:   Pneumonia Active Problems:   Chronic respiratory failure with hypoxia (HCC)   Tobacco use disorder   Anxiety   Vocal cord dysfunction   Acquired hypothyroidism   Chronic obstructive pulmonary disease (HCC)   COPD exacerbation (HCC)   Fall at home, initial  encounter     Chronic respiratory failure with hypoxia/  COPD with acute exacerbation Community-acquired pneumonia -Baseline O2 demand 3.5 L,  Currently at 4.5 L, satting 97%  POA: Shortness of breath with pleuritic chest pain Still having shortness of breath, cough  -PA chest negative for PE but significant for up pneumonia -Treating pneumonia with IV antibiotics, -Cultures no growth to date -Continue with IV Rocephin and azithromycin  -She was encouraged use incentive spirometry and flutter valve -Continue with IV steroids -Continue with as needed nebs -Continue with home Singulair and Breztri   Pleuritic chest pain -Intermittent, improving -Secondary to cough up pneumonia, not typical, EKG nonacute, troponins negative x 2, CTA chest negative for PE   OSA on CPAP - Continue CPAP at bedtime   Type 2 diabetes with neuropathy -Does not appear to be on any home medications -Will keep on sliding scale during hospital stay given she is on IV steroids   Essential hypertension -Blood pressure soft continue atenolol, holding HCTZ Pulm meds: atenolol, and hydrochlorothiazide As needed hydralazine   Mixed hyperlipidemia Continue Lipitor   Acquired hypothyroidism Continue Synthroid   GERD Continue Protonix   Diabetic neuropathy -Continue with home medication  including Lyrica   Anxiety/depression -Continue with home medication   Urinary retention -bladder scan in ED more than 450, will do one-time in and out,  -UA unremarkable  Will monitor bladder scan every 8 hours Initiating Flomax   Tobacco abuse -Still smoking, will start on nicotine patch  -------------------------------------------------------------------------------------------------------------------------------------- Nutritional status:  The patient's BMI is: Body mass index is 28.89 kg/m. I agree with the assessment and plan as outlined below: Nutrition Status:       Skin Assessment: I have  examined the patient's skin and I agree with the wound assessment as performed by wound care team As outlined belowe: Pressure Injury 08/15/20 Deep Tissue Pressure Injury - Purple or maroon localized area of discolored intact skin or blood-filled blister due to damage of underlying soft tissue from pressure and/or shear. (Active)  08/15/20   Location:   Location Orientation:   Staging: Deep Tissue Pressure Injury - Purple or maroon localized area of discolored intact skin or blood-filled blister due to damage of underlying soft tissue from pressure and/or shear.  Wound Description (Comments):   Present on Admission: Yes   -------------------------------------------------------------------------------------------------------------------------------------------  DVT prophylaxis:  enoxaparin (LOVENOX) injection 40 mg Start: 01/30/23 1000   Code Status:   Code Status: Do not attempt resuscitation (DNR) PRE-ARREST INTERVENTIONS DESIRED  Family Communication: No family member present at bedside- attempt will be made to update daily -Advance care planning has been discussed.  Continue discussion with family and TOC regarding electronic record indicating patient being active with hospice at home  Admission status:   Status is: Inpatient Remains inpatient appropriate because: Needing respiratory support, nebs, IV steroids,   Disposition: From  - home             Planning for discharge in 1-2 days: to Home   Procedures:   No admission procedures for hospital encounter.   Antimicrobials:  Anti-infectives (From admission, onward)    Start     Dose/Rate Route Frequency Ordered Stop   01/30/23 1600  cefTRIAXone (ROCEPHIN) 2 g in sodium chloride 0.9 % 100 mL IVPB        2 g 200 mL/hr over 30 Minutes Intravenous Every 24 hours 01/29/23 1735 02/04/23 1559   01/30/23 1600  azithromycin (ZITHROMAX) 500 mg in sodium chloride 0.9 % 250 mL IVPB  Status:  Discontinued        500 mg 250 mL/hr  over 60 Minutes Intravenous Every 24 hours 01/29/23 1735 01/29/23 1736   01/30/23 1000  azithromycin (ZITHROMAX) tablet 500 mg        500 mg Oral Daily 01/30/23 0709     01/29/23 1445  cefTRIAXone (ROCEPHIN) 2 g in sodium chloride 0.9 % 100 mL IVPB        2 g 200 mL/hr over 30 Minutes Intravenous  Once 01/29/23 1443 01/29/23 1656   01/29/23 1445  azithromycin (ZITHROMAX) 500 mg in sodium chloride 0.9 % 250 mL IVPB  Status:  Discontinued        500 mg 250 mL/hr over 60 Minutes Intravenous Every 24 hours 01/29/23 1443 01/30/23 0709        Medication:   albuterol  2.5 mg Nebulization TID   atenolol  25 mg Oral Daily   atorvastatin  20 mg Oral Daily   azithromycin  500 mg Oral Daily   enoxaparin (LOVENOX) injection  40 mg Subcutaneous Q24H   famotidine  20 mg Oral Daily   guaiFENesin-dextromethorphan  10 mL Oral Q8H   levothyroxine  25 mcg Oral  QAC breakfast   methylPREDNISolone (SOLU-MEDROL) injection  80 mg Intravenous Q12H   mometasone-formoterol  2 puff Inhalation BID   And   umeclidinium bromide  1 puff Inhalation Daily   montelukast  10 mg Oral QHS   nicotine  21 mg Transdermal Daily   pantoprazole  40 mg Oral Daily   pregabalin  300 mg Oral BID   QUEtiapine  100 mg Oral QHS   tamsulosin  0.4 mg Oral Daily   venlafaxine XR  150 mg Oral Q breakfast    albuterol, diazepam, hydrALAZINE, melatonin   Objective:   Vitals:   01/30/23 2025 01/30/23 2055 01/31/23 0418 01/31/23 0829  BP:  109/77 (!) 108/55   Pulse:  78 85   Resp:  20 18   Temp:  98.9 F (37.2 C) 98 F (36.7 C)   TempSrc:      SpO2: 96% 98% 96% 97%  Weight:      Height:        Intake/Output Summary (Last 24 hours) at 01/31/2023 1108 Last data filed at 01/31/2023 0900 Gross per 24 hour  Intake 720 ml  Output 1000 ml  Net -280 ml   Filed Weights   01/29/23 1353  Weight: 86.2 kg     Physical examination:        General:  AAO x 3,  cooperative, no distress;   HEENT:  Normocephalic, PERRL,  otherwise with in Normal limits   Neuro:  CNII-XII intact. , normal motor and sensation, reflexes intact   Lungs:   Clear to auscultation BL, Respirations unlabored,  ++ wheezes / crackles  Cardio:    S1/S2, RRR, No murmure, No Rubs or Gallops   Abdomen:  Soft, non-tender, bowel sounds active all four quadrants, no guarding or peritoneal signs.  Muscular  skeletal:  Limited exam -global generalized weaknesses - in bed, able to move all 4 extremities,   2+ pulses,  symmetric, No pitting edema  Skin:  Dry, warm to touch, negative for any Rashes,  Wounds: Please see nursing documentation  Pressure Injury 08/15/20 Deep Tissue Pressure Injury - Purple or maroon localized area of discolored intact skin or blood-filled blister due to damage of underlying soft tissue from pressure and/or shear. (Active)  08/15/20   Location:   Location Orientation:   Staging: Deep Tissue Pressure Injury - Purple or maroon localized area of discolored intact skin or blood-filled blister due to damage of underlying soft tissue from pressure and/or shear.  Wound Description (Comments):   Present on Admission: Yes          ------------------------------------------------------------------------------------------------------------------------------------------    LABs:     Latest Ref Rng & Units 01/31/2023    4:23 AM 01/30/2023    4:19 AM 01/29/2023    2:21 PM  CBC  WBC 4.0 - 10.5 K/uL 16.8  18.3  26.5   Hemoglobin 12.0 - 15.0 g/dL 16.1  09.6  04.5   Hematocrit 36.0 - 46.0 % 33.7  36.1  37.8   Platelets 150 - 400 K/uL 219  203  218       Latest Ref Rng & Units 01/31/2023    4:23 AM 01/30/2023    4:19 AM 01/29/2023    2:21 PM  CMP  Glucose 70 - 99 mg/dL 409  811  914   BUN 8 - 23 mg/dL 24  12  9    Creatinine 0.44 - 1.00 mg/dL 7.82  9.56  2.13   Sodium 135 - 145 mmol/L 136  133  135   Potassium 3.5 - 5.1 mmol/L 3.8  3.1  3.5   Chloride 98 - 111 mmol/L 98  95  94   CO2 22 - 32 mmol/L 27  27  33    Calcium 8.9 - 10.3 mg/dL 9.3  9.0  9.3   Total Protein 6.5 - 8.1 g/dL   6.9   Total Bilirubin <1.2 mg/dL   0.8   Alkaline Phos 38 - 126 U/L   107   AST 15 - 41 U/L   12   ALT 0 - 44 U/L   14        Micro Results Recent Results (from the past 240 hour(s))  Culture, blood (Routine X 2) w Reflex to ID Panel     Status: None (Preliminary result)   Collection Time: 01/29/23  6:18 PM   Specimen: BLOOD  Result Value Ref Range Status   Specimen Description BLOOD LEFT ANTECUBITAL  Final   Special Requests   Final    BOTTLES DRAWN AEROBIC AND ANAEROBIC Blood Culture adequate volume   Culture   Final    NO GROWTH 2 DAYS Performed at [facility], [address] [place], [place] [address]    Report Status PENDING  Incomplete  Culture, blood (Routine X 2) w Reflex to ID Panel     Status: None (Preliminary result)   Collection Time: 01/29/23  6:18 PM   Specimen: BLOOD  Result Value Ref Range Status   Specimen Description BLOOD BLOOD RIGHT HAND  Final   Special Requests   Final    BOTTLES DRAWN AEROBIC ONLY Blood Culture results may not be optimal due to an inadequate volume of blood received in culture bottles   Culture   Final    NO GROWTH 2 DAYS Performed at [facility], [address] [place], [place] [address]    Report Status PENDING  Incomplete    Radiology Reports No results found.  SIGNED: Kendell Bane, MD, FHM. FAAFP. [facility] - [facility] Time spent - 55 min.  In seeing, evaluating and examining the patient. Reviewing medical records, labs, drawn plan of care. [facility],  Pager (please use [email or web address] to page/ text) Please use Epic Secure Chat for non-urgent communication (7AM-7PM)  If 7PM-7AM, please contact night-coverage [email or web address], 01/31/2023, 11:08 AM

## 2023-01-31 NOTE — TOC Initial Note (Signed)
Transition of Care [facility]) - Initial/Assessment Note    Patient Details  Name: Joan [name] MRN: [id number] Date of Birth: 22-Jan-1961  Transition of [facility]) CM/SW Contact:    Elliot Gault, LCSW Phone Number: 01/31/2023, 2:54 PM  Clinical Narrative:                  Pt admitted from home. She has a high readmission risk score.  Met with pt at bedside to assess. Per pt, she resides alone in her apartment home and she plans to return home at dc. Pt is active with [facility] at home. She has home O2 at 3.5L. Pt states she is able to get to any appointments and obtain medications as needed.  Pt states she will need transportation home at dc. She does not anticipate having any other TOC needs.  TOC will follow.  Expected Discharge Plan: Home w Hospice Care Barriers to Discharge: Continued Medical Work up   Patient Goals and CMS Choice Patient states their goals for this hospitalization and ongoing recovery are:: return home   Choice offered to / list presented to : Patient      Expected Discharge Plan and Services In-house Referral: Clinical Social Work   Post Acute Care Choice: Resumption of Svcs/PTA Provider Living arrangements for the past 2 months: Apartment                                      Prior Living Arrangements/Services Living arrangements for the past 2 months: Apartment Lives with:: Self Patient language and need for interpreter reviewed:: Yes Do you feel safe going back to the place where you live?: Yes      Need for Family Participation in Patient Care: No (Comment)   Current home services: Hospice Criminal Activity/Legal Involvement Pertinent to Current Situation/Hospitalization: No - Comment as needed  Activities of Daily Living      Permission Sought/Granted                  Emotional Assessment Appearance:: Appears stated age Attitude/Demeanor/Rapport: Engaged Affect (typically observed): Pleasant Orientation: :  Oriented to Self, Oriented to Place, Oriented to  Time, Oriented to Situation Alcohol / Substance Use: Not Applicable Psych Involvement: No (comment)  Admission diagnosis:  Pneumonia [J18.9] COPD exacerbation (HCC) [J44.1] Community acquired pneumonia, unspecified laterality [J18.9] Patient Active Problem List   Diagnosis Date Noted   Pneumonia 01/29/2023   Acute metabolic encephalopathy 12/06/2022   Hyponatremia 12/06/2022   Polypharmacy 12/06/2022   Fall at home, initial encounter 12/06/2022   Acute encephalopathy 12/06/2022   COPD exacerbation (HCC) 12/05/2022   Coronary artery disease 11/05/2022   Counseling and coordination of care 10/10/2022   Goals of care, counseling/discussion 10/10/2022   Palliative care encounter 10/10/2022   Ulcer of right foot (HCC) 10/09/2022   Obesity (BMI 30-39.9) 09/11/2022   Itching 08/14/2022   PAD (peripheral artery disease) (HCC) 08/14/2022   Cervical radiculopathy 02/14/2022   Chronic left shoulder pain 11/26/2021   Chronic pain of left thumb 11/26/2021   Chronic obstructive pulmonary disease (HCC) 11/17/2021   De Quervain's tenosynovitis, left 11/17/2021   Needs assistance with community resources 11/17/2021   Neuropathy due to type 2 diabetes mellitus (HCC) 10/12/2021   Atypical chest pain 04/17/2021   Hypokalemia 04/03/2021   COPD (chronic obstructive pulmonary disease) with emphysema (HCC) 04/03/2021   Acquired hypothyroidism    Controlled type 2 diabetes  mellitus with neuropathy [facility])    Generalized abdominal pain 03/28/2021   Depression 08/16/2020   PTSD (post-traumatic stress disorder) 02/09/2020   Mood disorder in conditions classified elsewhere 02/09/2020   Generalized anxiety disorder 02/09/2020   Coarse tremors 10/28/2019   Chronic rhinitis 10/21/2019   Allergic rhinitis 06/16/2019   Cervical spondylosis without myelopathy 12/18/2018   Hypertension    Encounter for general adult medical examination with abnormal findings  11/06/2018   Smokers' cough (HCC) 01/15/2018   Pulmonary nodule 11/11/2017   Carotid artery disease (HCC) 11/05/2017   OSA on CPAP 08/07/2017   GERD (gastroesophageal reflux disease) 06/23/2017   Hypothyroidism due to acquired atrophy of thyroid 04/05/2017   Primary osteoarthritis involving multiple joints 04/05/2017   Chronic constipation 02/10/2017   Iron deficiency anemia due to dietary causes 02/10/2017   Osteoarthritis 02/10/2017   Mixed hyperlipidemia 02/10/2017   Chronic respiratory failure with hypoxia (HCC) 12/16/2016   Essential hypertension 11/29/2016   Vocal cord dysfunction    Tobacco use disorder 09/24/2016   Anxiety 09/24/2016   PCP:  Myrlene Broker, MD Pharmacy:   [facility] - [place], [place] - [address] [place] [address] Phone: [phone] Fax: [phone]  [id number] - [place], [place] - [address] [place] [address] Phone: [phone] Fax: [phone]  [facility] - [place], [place] - [address] [place] [address] Phone: [phone] Fax: [phone]     Social Determinants of Health (SDOH) Social History: SDOH Screenings   Food Insecurity: No Food Insecurity (01/30/2023)  Housing: Patient Declined (01/30/2023)  Transportation Needs: No Transportation Needs (01/30/2023)  Utilities: Not At Risk (01/30/2023)  Alcohol Screen: Low Risk  (08/30/2022)  Depression (PHQ2-9): Low Risk  (08/30/2022)  Financial Resource Strain: High Risk (08/30/2022)  Physical Activity: Inactive (08/30/2022)  Social Connections: Moderately Isolated (08/30/2022)  Stress: No Stress Concern Present (08/30/2022)  Recent Concern: Stress - Stress Concern Present (08/12/2022)  Tobacco Use: Medium Risk (01/30/2023)  Health Literacy: Low Risk  (02/18/2022)   Received from [facility], [facility]   SDOH Interventions:     Readmission  Risk Interventions    01/31/2023    2:52 PM 09/13/2022   10:53 AM  Readmission Risk Prevention Plan  Transportation Screening Complete Complete  HRI or Home Care Consult  Complete  Social Work Consult for Recovery Care Planning/Counseling  Complete  Palliative Care Screening  Not Applicable  Medication Review [employer]) Complete Complete  HRI or Home Care Consult Complete   SW Recovery Care/Counseling Consult Complete   Palliative Care Screening Complete   Skilled Nursing Facility Not Applicable

## 2023-01-31 NOTE — Progress Notes (Signed)
Patient using hospital CPAP independently. Made sure O2 was connected and machine was ready for use. Told patient to call if she needed anything.

## 2023-02-01 DIAGNOSIS — J189 Pneumonia, unspecified organism: Secondary | ICD-10-CM | POA: Diagnosis not present

## 2023-02-01 LAB — GLUCOSE, CAPILLARY
Glucose-Capillary: 426 mg/dL — ABNORMAL HIGH (ref 70–99)
Glucose-Capillary: 113 mg/dL — ABNORMAL HIGH (ref 70–99)
Glucose-Capillary: 339 mg/dL — ABNORMAL HIGH (ref 70–99)

## 2023-02-01 LAB — BASIC METABOLIC PANEL
Anion gap: 10 (ref 5–15)
BUN: 22 mg/dL (ref 8–23)
CO2: 28 mmol/L (ref 22–32)
Calcium: 9 mg/dL (ref 8.9–10.3)
Chloride: 94 mmol/L — ABNORMAL LOW (ref 98–111)
Creatinine, Ser: 0.78 mg/dL (ref 0.44–1.00)
GFR, Estimated: >60 mL/min (ref >60)
Glucose, Bld: 416 mg/dL — ABNORMAL HIGH (ref 70–99)
Potassium: 3.6 mmol/L (ref 3.5–5.1)
Sodium: 132 mmol/L — ABNORMAL LOW (ref 135–145)

## 2023-02-01 LAB — CBC
HCT: 33.7 % — ABNORMAL LOW (ref 36.0–46.0)
Hemoglobin: 10.8 g/dL — ABNORMAL LOW (ref 12.0–15.0)
MCH: 29 pg (ref 26.0–34.0)
MCHC: 32 g/dL (ref 30.0–36.0)
MCV: 90.6 fL (ref 80.0–100.0)
Platelets: 225 10*3/uL (ref 150–400)
RBC: 3.72 MIL/uL — ABNORMAL LOW (ref 3.87–5.11)
RDW: 15.2 % (ref 11.5–15.5)
WBC: 11.1 10*3/uL — ABNORMAL HIGH (ref 4.0–10.5)
nRBC: 0 % (ref 0.0–0.2)

## 2023-02-01 MED ORDER — ACYCLOVIR 5 % EX OINT
TOPICAL_OINTMENT | CUTANEOUS | Status: DC
  Administered 2023-02-02 (×2): 1 via TOPICAL
  Filled 2023-02-01: qty 15

## 2023-02-01 MED ORDER — METHYLPREDNISOLONE 4 MG PO TABS
24.0000 mg | ORAL_TABLET | Freq: Every day | ORAL | Status: DC
  Administered 2023-02-01 – 2023-02-02 (×2): 24 mg via ORAL
  Filled 2023-02-01 (×3): qty 6

## 2023-02-01 MED ORDER — INSULIN ASPART 100 UNIT/ML IJ SOLN
0.0000 [IU] | Freq: Three times a day (TID) | INTRAMUSCULAR | Status: DC
  Administered 2023-02-02: 5 [IU] via SUBCUTANEOUS
  Administered 2023-02-02: 2 [IU] via SUBCUTANEOUS

## 2023-02-01 MED ORDER — VALACYCLOVIR HCL 500 MG PO TABS
1000.0000 mg | ORAL_TABLET | Freq: Two times a day (BID) | ORAL | Status: AC
  Administered 2023-02-01 – 2023-02-02 (×2): 1000 mg via ORAL
  Filled 2023-02-01 (×2): qty 2

## 2023-02-01 MED ORDER — LEVOFLOXACIN 500 MG PO TABS: 500.0000 mg | ORAL_TABLET | Freq: Every day | ORAL | Status: DC

## 2023-02-01 MED ORDER — METHYLPREDNISOLONE SODIUM SUCC 125 MG IJ SOLR: 80.0000 mg | INTRAMUSCULAR | Status: DC

## 2023-02-01 MED ORDER — LEVOFLOXACIN 500 MG PO TABS
750.0000 mg | ORAL_TABLET | Freq: Every day | ORAL | Status: DC
  Administered 2023-02-01 – 2023-02-02 (×2): 750 mg via ORAL
  Filled 2023-02-01 (×2): qty 2

## 2023-02-01 MED ORDER — INSULIN ASPART 100 UNIT/ML IJ SOLN
10.0000 [IU] | Freq: Once | INTRAMUSCULAR | Status: AC
  Administered 2023-02-01: 10 [IU] via SUBCUTANEOUS

## 2023-02-01 MED ORDER — FLUCONAZOLE 150 MG PO TABS
150.0000 mg | ORAL_TABLET | Freq: Once | ORAL | Status: AC
  Administered 2023-02-01: 150 mg via ORAL
  Filled 2023-02-01: qty 1

## 2023-02-01 NOTE — Progress Notes (Signed)
PROGRESS NOTE    Patient: Joan [name]                            PCP: Myrlene Broker, MD                    DOB: May 27, 1960            DOA: 01/29/2023 [id number]:[id number]             DOS: 02/01/2023, 10:34 AM   LOS: 3 days   Date of Service: The patient was seen and examined on 02/01/2023  Subjective:   The patient was seen examined this morning, stable no acute distress sitting at the side of the bed complaining shortness of breath with exertion- improved wheezing Stating feeling weak  Currently satting 95% on 3 L of oxygen   Brief Narrative:   Ariany Browdy  is a 62 y.o. female,  with medical history significant of terminal COPD on hospice care, hypertension, hyperlipidemia, T2DM, OSA on CPAP who presented to the emergency department secondary to complaints of shortness of breath and chest pain, patient reports pleuritic chest pain, in the right chest area, worsened by deep breathing, and activity, as well she does report progressive dyspnea, no nausea, no vomiting, no headache, no fever, she does report cough at baseline, as well reports hoarseness in her voice at baseline, she denies any sick contact, reports has been feeling worse over last 24 hours which prompted her to come to ED, she is on 3.5 L nasal cannula at baseline, reports she is still smoking. ED: O2 demand 5 L, hemodynamically stable EKG with no acute findings, troponins negative x 2, CTA chest was negative for PE but with evidence of pneumonia, she had leukocytosis at 26K, but lactic acid within normal limit, she had  wheezing on presentation, she was started on IV steroids, IV antibiotics, she had urinary retention with bladder scan more than 450 cc.    Assessment & Plan:     Principal Problem:   Pneumonia Active Problems:   Chronic respiratory failure with hypoxia (HCC)   Tobacco use disorder   Anxiety   Vocal cord dysfunction   Acquired hypothyroidism   Chronic obstructive pulmonary disease (HCC)   COPD  exacerbation (HCC)   Fall at home, initial encounter     Chronic respiratory failure with hypoxia/  COPD with acute exacerbation Community-acquired pneumonia -Baseline O2 demand 3.5 L,  -Down from 5 L to 3 L of oxygen, satting 95% -Still complaining of weakness and shortness of breath with minimal exertion  POA: Shortness of breath with pleuritic chest pain   -PA chest negative for PE but significant for up pneumonia -Treating pneumonia with IV antibiotics  -Cultures no growth to date -Continue with IV Rocephin and azithromycin >> switching to p.o. Levaquin  -She was encouraged use incentive spirometry and flutter valve -Continue with IV steroids>> switching to p.o. Solu-Medrol (intolerance to prednisone) -Continue with as needed nebs -Continue with home Singulair and Breztri   Pleuritic chest pain -Intermittent, improving -Secondary to cough up pneumonia, not typical, EKG nonacute, troponins negative x 2, CTA chest negative for PE   OSA on CPAP - Continue CPAP at bedtime   Type 2 diabetes with neuropathy -Does not appear to be on any home medications -Will keep on sliding scale during hospital stay given she is on IV steroids   Essential hypertension -Blood pressure soft continue atenolol, holding HCTZ  Home meds:  Atenolol, and hydrochlorothiazide As needed IV hydralazine   Mixed hyperlipidemia Continue Lipitor   Acquired hypothyroidism Continue Synthroid   GERD Continue Protonix   Diabetic neuropathy -Continue with home medication including Lyrica   Anxiety/depression -Continue with home medication   Urinary retention -bladder scan as needed  -UA unremarkable  Will monitor bladder scan every 8 hours Initiating Flomax   Tobacco abuse -Still smoking, will start on nicotine patch  -------------------------------------------------------------------------------------------------------------------------------------- Nutritional status:  The patient's BMI  is: Body mass index is 28.89 kg/m. I agree with the assessment and plan as outlined below: Nutrition Status:       Skin Assessment: I have examined the patient's skin and I agree with the wound assessment as performed by wound care team As outlined belowe: Pressure Injury 08/15/20 Deep Tissue Pressure Injury - Purple or maroon localized area of discolored intact skin or blood-filled blister due to damage of underlying soft tissue from pressure and/or shear. (Active)  08/15/20   Location:   Location Orientation:   Staging: Deep Tissue Pressure Injury - Purple or maroon localized area of discolored intact skin or blood-filled blister due to damage of underlying soft tissue from pressure and/or shear.  Wound Description (Comments):   Present on Admission: Yes   -------------------------------------------------------------------------------------------------------------------------------------------  DVT prophylaxis:  enoxaparin (LOVENOX) injection 40 mg Start: 01/30/23 1000   Code Status:   Code Status: Do not attempt resuscitation (DNR) PRE-ARREST INTERVENTIONS DESIRED  Family Communication: No family member present at bedside- attempt will be made to update daily -Advance care planning has been discussed.  Continue discussion with family and TOC regarding electronic record indicating patient being active with hospice at home  Admission status:   Status is: Inpatient Remains inpatient appropriate because: Needing respiratory support, nebs, IV steroids,   Disposition: From  - home             Planning for discharge in 1-2 days: to Home   Procedures:   No admission procedures for hospital encounter.   Antimicrobials:  Anti-infectives (From admission, onward)    Start     Dose/Rate Route Frequency Ordered Stop   02/01/23 1000  levofloxacin (LEVAQUIN) tablet 500 mg  Status:  Discontinued        500 mg Oral Daily 02/01/23 0738 02/01/23 0739   02/01/23 1000  levofloxacin  (LEVAQUIN) tablet 750 mg        750 mg Oral Daily 02/01/23 0739     01/30/23 1600  cefTRIAXone (ROCEPHIN) 2 g in sodium chloride 0.9 % 100 mL IVPB  Status:  Discontinued        2 g 200 mL/hr over 30 Minutes Intravenous Every 24 hours 01/29/23 1735 02/01/23 0738   01/30/23 1600  azithromycin (ZITHROMAX) 500 mg in sodium chloride 0.9 % 250 mL IVPB  Status:  Discontinued        500 mg 250 mL/hr over 60 Minutes Intravenous Every 24 hours 01/29/23 1735 01/29/23 1736   01/30/23 1000  azithromycin (ZITHROMAX) tablet 500 mg  Status:  Discontinued        500 mg Oral Daily 01/30/23 0709 02/01/23 0738   01/29/23 1445  cefTRIAXone (ROCEPHIN) 2 g in sodium chloride 0.9 % 100 mL IVPB        2 g 200 mL/hr over 30 Minutes Intravenous  Once 01/29/23 1443 01/29/23 1656   01/29/23 1445  azithromycin (ZITHROMAX) 500 mg in sodium chloride 0.9 % 250 mL IVPB  Status:  Discontinued  500 mg 250 mL/hr over 60 Minutes Intravenous Every 24 hours 01/29/23 1443 01/30/23 0709        Medication:   albuterol  2.5 mg Nebulization TID   atenolol  25 mg Oral Daily   atorvastatin  20 mg Oral Daily   enoxaparin (LOVENOX) injection  40 mg Subcutaneous Q24H   famotidine  20 mg Oral Daily   guaiFENesin-dextromethorphan  10 mL Oral Q8H   levofloxacin  750 mg Oral Daily   levothyroxine  25 mcg Oral QAC breakfast   methylPREDNISolone  32 mg Oral Daily   mometasone-formoterol  2 puff Inhalation BID   And   umeclidinium bromide  1 puff Inhalation Daily   montelukast  10 mg Oral QHS   nicotine  21 mg Transdermal Daily   pantoprazole  40 mg Oral Daily   pregabalin  300 mg Oral BID   QUEtiapine  100 mg Oral QHS   tamsulosin  0.4 mg Oral Daily   traZODone  50 mg Oral QHS   venlafaxine XR  150 mg Oral Q breakfast    albuterol, diazepam, hydrALAZINE, melatonin   Objective:   Vitals:   01/31/23 2013 01/31/23 2018 01/31/23 2059 02/01/23 0520  BP:   128/74 131/74  Pulse:   67 73  Resp:   20 20  Temp:   98.3 F  (36.8 C) 97.9 F (36.6 C)  TempSrc:      SpO2: 93% 93% 97% 95%  Weight:      Height:        Intake/Output Summary (Last 24 hours) at 02/01/2023 1034 Last data filed at 02/01/2023 0539 Gross per 24 hour  Intake 720 ml  Output 900 ml  Net -180 ml   Filed Weights   01/29/23 1353  Weight: 86.2 kg     Physical examination:    General:  Mild shortness of breath, AAO x 3,  cooperative, no distress;   HEENT:  Normocephalic, PERRL, otherwise with in Normal limits   Neuro:  CNII-XII intact. , normal motor and sensation, reflexes intact   Lungs:   Clear to auscultation BL, Respirations unlabored,  Proved wheezing and rhonchi,  Cardio:    S1/S2, RRR, No murmure, No Rubs or Gallops   Abdomen:  Soft, non-tender, bowel sounds active all four quadrants, no guarding or peritoneal signs.  Muscular  skeletal:  Limited exam -global generalized weaknesses - in bed, able to move all 4 extremities,   2+ pulses,  symmetric, No pitting edema  Skin:  Dry, warm to touch, negative for any Rashes,  Wounds: Please see nursing documentation  Pressure Injury 08/15/20 Deep Tissue Pressure Injury - Purple or maroon localized area of discolored intact skin or blood-filled blister due to damage of underlying soft tissue from pressure and/or shear. (Active)  08/15/20   Location:   Location Orientation:   Staging: Deep Tissue Pressure Injury - Purple or maroon localized area of discolored intact skin or blood-filled blister due to damage of underlying soft tissue from pressure and/or shear.  Wound Description (Comments):   Present on Admission: Yes             ------------------------------------------------------------------------------------------------------------------------------------------    LABs:     Latest Ref Rng & Units 02/01/2023    3:39 AM 01/31/2023    4:23 AM 01/30/2023    4:19 AM  CBC  WBC 4.0 - 10.5 K/uL 11.1  16.8  18.3   Hemoglobin 12.0 - 15.0 g/dL 16.1  09.6  04.5    Hematocrit  36.0 - 46.0 % 33.7  33.7  36.1   Platelets 150 - 400 K/uL 225  219  203       Latest Ref Rng & Units 02/01/2023    3:39 AM 01/31/2023    4:23 AM 01/30/2023    4:19 AM  CMP  Glucose 70 - 99 mg/dL 657  846  962   BUN 8 - 23 mg/dL 22  24  12    Creatinine 0.44 - 1.00 mg/dL 9.52  8.41  3.24   Sodium 135 - 145 mmol/L 132  136  133   Potassium 3.5 - 5.1 mmol/L 3.6  3.8  3.1   Chloride 98 - 111 mmol/L 94  98  95   CO2 22 - 32 mmol/L 28  27  27    Calcium 8.9 - 10.3 mg/dL 9.0  9.3  9.0        Micro Results Recent Results (from the past 240 hour(s))  Culture, blood (Routine X 2) w Reflex to ID Panel     Status: None (Preliminary result)   Collection Time: 01/29/23  6:18 PM   Specimen: BLOOD  Result Value Ref Range Status   Specimen Description BLOOD LEFT ANTECUBITAL  Final   Special Requests   Final    BOTTLES DRAWN AEROBIC AND ANAEROBIC Blood Culture adequate volume   Culture   Final    NO GROWTH 3 DAYS Performed at [facility], [address], [place], [place] [address]    Report Status PENDING  Incomplete  Culture, blood (Routine X 2) w Reflex to ID Panel     Status: None (Preliminary result)   Collection Time: 01/29/23  6:18 PM   Specimen: BLOOD  Result Value Ref Range Status   Specimen Description BLOOD BLOOD RIGHT HAND  Final   Special Requests   Final    BOTTLES DRAWN AEROBIC ONLY Blood Culture results may not be optimal due to an inadequate volume of blood received in culture bottles   Culture   Final    NO GROWTH 3 DAYS Performed at [facility], [address] [place], [place] [address]    Report Status PENDING  Incomplete    Radiology Reports No results found.  SIGNED: Kendell Bane, MD, FHM. FAAFP. [facility] - [facility] Time spent - 55 min.  In seeing, evaluating and examining the patient. Reviewing medical records, labs, drawn plan of care. [facility],  Pager (please use [email or web address] to page/ text) Please use Epic Secure Chat for  non-urgent communication (7AM-7PM)  If 7PM-7AM, please contact night-coverage [email or web address], 02/01/2023, 10:34 AM

## 2023-02-01 NOTE — Plan of Care (Signed)
  Problem: Education: Goal: Knowledge of General Education information will improve Description: Including pain rating scale, medication(s)/side effects and non-pharmacologic comfort measures Outcome: Progressing   Problem: Health Behavior/Discharge Planning: Goal: Ability to manage health-related needs will improve Outcome: Progressing   Problem: Clinical Measurements: Goal: Ability to maintain clinical measurements within normal limits will improve Outcome: Progressing Goal: Will remain free from infection Outcome: Progressing Goal: Diagnostic test results will improve Outcome: Progressing Goal: Respiratory complications will improve Outcome: Progressing Goal: Cardiovascular complication will be avoided Outcome: Progressing   Problem: Activity: Goal: Risk for activity intolerance will decrease Outcome: Progressing   Problem: Nutrition: Goal: Adequate nutrition will be maintained Outcome: Progressing   Problem: Coping: Goal: Level of anxiety will decrease Outcome: Progressing   Problem: Elimination: Goal: Will not experience complications related to bowel motility Outcome: Progressing Goal: Will not experience complications related to urinary retention Outcome: Progressing   Problem: Pain Management: Goal: General experience of comfort will improve Outcome: Progressing   Problem: Safety: Goal: Ability to remain free from injury will improve Outcome: Progressing   Problem: Skin Integrity: Goal: Risk for impaired skin integrity will decrease Outcome: Progressing   Problem: Education: Goal: Knowledge of disease or condition will improve Outcome: Progressing Goal: Knowledge of the prescribed therapeutic regimen will improve Outcome: Progressing Goal: Individualized Educational Video(s) Outcome: Progressing   Problem: Activity: Goal: Ability to tolerate increased activity will improve Outcome: Progressing Goal: Will verbalize the importance of balancing  activity with adequate rest periods Outcome: Progressing   Problem: Respiratory: Goal: Ability to maintain a clear airway will improve Outcome: Progressing Goal: Levels of oxygenation will improve Outcome: Progressing Goal: Ability to maintain adequate ventilation will improve Outcome: Progressing   Problem: Activity: Goal: Ability to tolerate increased activity will improve Outcome: Progressing   Problem: Clinical Measurements: Goal: Ability to maintain a body temperature in the normal range will improve Outcome: Progressing   Problem: Respiratory: Goal: Ability to maintain adequate ventilation will improve Outcome: Progressing Goal: Ability to maintain a clear airway will improve Outcome: Progressing

## 2023-02-01 NOTE — Progress Notes (Signed)
PT says she is having a herpes outbreak lips burning and blisters starting to form. Looks red around lips.

## 2023-02-01 NOTE — Progress Notes (Signed)
Patient verbalized complaints of increased fatigue and increased tiredness. Md Shahmehdi made aware.

## 2023-02-01 NOTE — Progress Notes (Signed)
Patient states she may not wear CPAP tonight. Stated she had a hard time with it last night. I gave patient my number and told her to call me if she changed her mind and wanted to wear it.

## 2023-02-01 NOTE — Progress Notes (Signed)
Patient alert and verbal, tolerated medications whole with no complaints. Ambulated in the hallway with staff during shift, reported no complaints of shortness of breath, oxygen saturation 97% on 3 liters. Patient verbalized complaints of discomfort around outer lips/mouth, requested something for discomfort. Patient stated she herpes flare ups at times. MD Shahmehdi made aware. No new orders.

## 2023-02-02 DIAGNOSIS — J189 Pneumonia, unspecified organism: Secondary | ICD-10-CM | POA: Diagnosis not present

## 2023-02-02 LAB — CBC
HCT: 38.1 % (ref 36.0–46.0)
Hemoglobin: 12.2 g/dL (ref 12.0–15.0)
MCH: 29 pg (ref 26.0–34.0)
MCHC: 32 g/dL (ref 30.0–36.0)
MCV: 90.7 fL (ref 80.0–100.0)
Platelets: 213 10*3/uL (ref 150–400)
RBC: 4.2 MIL/uL (ref 3.87–5.11)
RDW: 14.9 % (ref 11.5–15.5)
WBC: 10.8 10*3/uL — ABNORMAL HIGH (ref 4.0–10.5)
nRBC: 0 % (ref 0.0–0.2)

## 2023-02-02 LAB — GLUCOSE, CAPILLARY
Glucose-Capillary: 145 mg/dL — ABNORMAL HIGH (ref 70–99)
Glucose-Capillary: 226 mg/dL — ABNORMAL HIGH (ref 70–99)

## 2023-02-02 LAB — BASIC METABOLIC PANEL
Anion gap: 9 (ref 5–15)
BUN: 21 mg/dL (ref 8–23)
CO2: 32 mmol/L (ref 22–32)
Calcium: 9.1 mg/dL (ref 8.9–10.3)
Chloride: 95 mmol/L — ABNORMAL LOW (ref 98–111)
Creatinine, Ser: 0.79 mg/dL (ref 0.44–1.00)
GFR, Estimated: >60 mL/min (ref >60)
Glucose, Bld: 149 mg/dL — ABNORMAL HIGH (ref 70–99)
Potassium: 3.6 mmol/L (ref 3.5–5.1)
Sodium: 136 mmol/L (ref 135–145)

## 2023-02-02 MED ORDER — LACTINEX PO CHEW: 1.0000 | CHEWABLE_TABLET | Freq: Three times a day (TID) | ORAL | 0 refills | Status: AC

## 2023-02-02 MED ORDER — FLUCONAZOLE 150 MG PO TABS: 150.0000 mg | ORAL_TABLET | Freq: Every day | ORAL | 1 refills | Status: DC

## 2023-02-02 MED ORDER — GUAIFENESIN-DM 100-10 MG/5ML PO SYRP: 10.0000 mL | ORAL_SOLUTION | Freq: Three times a day (TID) | ORAL | 0 refills | Status: AC

## 2023-02-02 MED ORDER — METFORMIN HCL ER (OSM) 1000 MG PO TB24: 1000.0000 mg | ORAL_TABLET | Freq: Every day | ORAL | 1 refills | Status: DC

## 2023-02-02 MED ORDER — LEVOFLOXACIN 750 MG PO TABS: 750.0000 mg | ORAL_TABLET | Freq: Every day | ORAL | 0 refills | Status: AC

## 2023-02-02 MED ORDER — DRS CHOICE DIABETIC BANDAGES EX KIT: 8.0000 | PACK | Freq: Three times a day (TID) | CUTANEOUS | 10 refills | Status: DC

## 2023-02-02 MED ORDER — METHYLPREDNISOLONE 8 MG PO TABS: ORAL_TABLET | ORAL | 0 refills | Status: AC

## 2023-02-02 NOTE — Plan of Care (Signed)
  Problem: Education: Goal: Knowledge of General Education information will improve Description: Including pain rating scale, medication(s)/side effects and non-pharmacologic comfort measures Outcome: Progressing   Problem: Health Behavior/Discharge Planning: Goal: Ability to manage health-related needs will improve Outcome: Progressing   Problem: Clinical Measurements: Goal: Ability to maintain clinical measurements within normal limits will improve Outcome: Progressing Goal: Will remain free from infection Outcome: Progressing Goal: Diagnostic test results will improve Outcome: Progressing Goal: Respiratory complications will improve Outcome: Progressing Goal: Cardiovascular complication will be avoided Outcome: Progressing   Problem: Activity: Goal: Risk for activity intolerance will decrease Outcome: Progressing   Problem: Nutrition: Goal: Adequate nutrition will be maintained Outcome: Progressing   Problem: Coping: Goal: Level of anxiety will decrease Outcome: Progressing   Problem: Elimination: Goal: Will not experience complications related to bowel motility Outcome: Progressing Goal: Will not experience complications related to urinary retention Outcome: Progressing   Problem: Pain Management: Goal: General experience of comfort will improve Outcome: Progressing   Problem: Safety: Goal: Ability to remain free from injury will improve Outcome: Progressing   Problem: Skin Integrity: Goal: Risk for impaired skin integrity will decrease Outcome: Progressing   Problem: Education: Goal: Knowledge of disease or condition will improve Outcome: Progressing Goal: Knowledge of the prescribed therapeutic regimen will improve Outcome: Progressing Goal: Individualized Educational Video(s) Outcome: Progressing   Problem: Activity: Goal: Ability to tolerate increased activity will improve Outcome: Progressing Goal: Will verbalize the importance of balancing  activity with adequate rest periods Outcome: Progressing   Problem: Respiratory: Goal: Ability to maintain a clear airway will improve Outcome: Progressing Goal: Levels of oxygenation will improve Outcome: Progressing Goal: Ability to maintain adequate ventilation will improve Outcome: Progressing   Problem: Activity: Goal: Ability to tolerate increased activity will improve Outcome: Progressing   Problem: Clinical Measurements: Goal: Ability to maintain a body temperature in the normal range will improve Outcome: Progressing   Problem: Respiratory: Goal: Ability to maintain adequate ventilation will improve Outcome: Progressing Goal: Ability to maintain a clear airway will improve Outcome: Progressing   Problem: Education: Goal: Ability to describe self-care measures that may prevent or decrease complications (Diabetes Survival Skills Education) will improve Outcome: Progressing Goal: Individualized Educational Video(s) Outcome: Progressing   Problem: Coping: Goal: Ability to adjust to condition or change in health will improve Outcome: Progressing   Problem: Fluid Volume: Goal: Ability to maintain a balanced intake and output will improve Outcome: Progressing   Problem: Health Behavior/Discharge Planning: Goal: Ability to identify and utilize available resources and services will improve Outcome: Progressing Goal: Ability to manage health-related needs will improve Outcome: Progressing   Problem: Metabolic: Goal: Ability to maintain appropriate glucose levels will improve Outcome: Progressing   Problem: Nutritional: Goal: Maintenance of adequate nutrition will improve Outcome: Progressing Goal: Progress toward achieving an optimal weight will improve Outcome: Progressing   Problem: Skin Integrity: Goal: Risk for impaired skin integrity will decrease Outcome: Progressing   Problem: Tissue Perfusion: Goal: Adequacy of tissue perfusion will improve Outcome:  Progressing

## 2023-02-02 NOTE — Discharge Summary (Addendum)
Physician Discharge Summary   Patient: Joan [name] MRN: [id number] DOB: 07/30/1960  Admit date:     01/29/2023  Discharge date: 02/02/23  Discharge Physician: Kendell Bane   PCP: Myrlene Broker, MD   Recommendations at discharge:   Continue supplemental oxygen, may increase with activity Continue current medication with taper down steroids Follow-up with PCP and hospice within 1-2 weeks  Discharge Diagnoses: Principal Problem:   Pneumonia Active Problems:   Chronic respiratory failure with hypoxia (HCC)   Tobacco use disorder   Anxiety   Vocal cord dysfunction   Acquired hypothyroidism   Chronic obstructive pulmonary disease (HCC)   COPD exacerbation (HCC)   Fall at home, initial encounter  Resolved Problems:   * No resolved hospital problems. *  Hospital Course: Joan [name]  is a 62 y.o. female,  with medical history significant of terminal COPD on hospice care, hypertension, hyperlipidemia, T2DM, OSA on CPAP who presented to the emergency department secondary to complaints of shortness of breath and chest pain, patient reports pleuritic chest pain, in the right chest area, worsened by deep breathing, and activity, as well she does report progressive dyspnea, no nausea, no vomiting, no headache, no fever, she does report cough at baseline, as well reports hoarseness in her voice at baseline, she denies any sick contact, reports has been feeling worse over last 24 hours which prompted her to come to ED, she is on 3.5 L nasal cannula at baseline, reports she is still smoking. ED: O2 demand 5 L, hemodynamically stable EKG with no acute findings, troponins negative x 2, CTA chest was negative for PE but with evidence of pneumonia, she had leukocytosis at 26K, but lactic acid within normal limit, she had  wheezing on presentation, she was started on IV steroids, IV antibiotics, she had urinary retention with bladder scan more than 450 cc.     Chronic respiratory  failure with hypoxia/  COPD with acute exacerbation Community-acquired pneumonia -Baseline O2 demand 3.5 L,  -Down from 5 L to 3 L of oxygen, satting 95% -Still complaining of weakness and shortness of breath with minimal exertion  POA: Shortness of breath with pleuritic chest pain   -PA chest negative for PE but significant for up pneumonia -Treating pneumonia with IV antibiotics  -Cultures no growth to date -Continue with IV Rocephin and azithromycin >> switching to p.o. Levaquin  -She was encouraged use incentive spirometry and flutter valve -Continue with IV steroids>> switching to p.o. Solu-Medrol (intolerance to prednisone) -Continue with as needed nebs -Continue with home Singulair and Breztri   Pleuritic chest pain -Intermittent, improving -Secondary to cough up pneumonia, not typical, EKG nonacute, troponins negative x 2, CTA chest negative for PE   OSA on CPAP - Continue CPAP at bedtime   Type 2 diabetes with neuropathy -Does not appear to be on any home medications -Patient wants Lantus, insulin due to hypoglycemia secondary to IV steroids -Added p.o. Metformin   Essential hypertension -Blood pressure soft continue atenolol, holding HCTZ Home meds:  Atenolol,  Continued HCTZ   Mixed hyperlipidemia Continue Lipitor   Acquired hypothyroidism Continue Synthroid   GERD Continue Protonix   Diabetic neuropathy -Continue with home medication including Lyrica   Anxiety/depression -Continue with home medication   Urinary retention --Patient had a brief retention, was treated with Flomax Subsequently discontinued on Discharge    Tobacco abuse -Still smoking, will start on nicotine patch  Assessment and Plan: No notes have been filed under this hospital service. Service:  Hospitalist        Disposition: Home Diet recommendation:  Discharge Diet Orders (From admission, onward)     Start     Ordered   02/02/23 0000  Diet - low sodium heart healthy         02/02/23 1022           Carb modified diet DISCHARGE MEDICATION: Allergies as of 02/02/2023       Reactions   Keflex [cephalexin] Rash, Other (See Comments)   Has tolerated amoxicillin since had keflex   Shellfish Allergy Shortness Of Breath, Nausea And Vomiting, Other (See Comments)   Stomach cramps   Tetracyclines & Related Anaphylaxis, Swelling, Other (See Comments)   Throat swelling requiring hospitalization   Dilaudid [hydromorphone Hcl] Other (See Comments)   "Lethargy"   Prednisone Other (See Comments)   "counter reacts", has tolerated IV medrol   Clindamycin/lincomycin Other (See Comments)   Burning sensation in her mouth down her throat    Incruse Ellipta [umeclidinium Bromide] Nausea Only   Molds & Smuts Itching   "Allergic to everything", pt said she is allergic to most environmental allergens   Tuberculin Tests Other (See Comments)   False postive   Umeclidinium    Other Reaction(s): GI Intolerance        Medication List     STOP taking these medications    acyclovir 400 MG tablet Commonly known as: ZOVIRAX   amLODipine 5 MG tablet Commonly known as: NORVASC   Breztri Aerosphere 160-9-4.8 MCG/ACT Aero Generic drug: Budeson-Glycopyrrol-Formoterol   busPIRone 5 MG tablet Commonly known as: BUSPAR   cetirizine 10 MG tablet Commonly known as: ZYRTEC   cyclobenzaprine 5 MG tablet Commonly known as: FLEXERIL   diphenhydrAMINE 25 mg capsule Commonly known as: BENADRYL   hydrochlorothiazide 25 MG tablet Commonly known as: HYDRODIURIL   naproxen 500 MG tablet Commonly known as: NAPROSYN       TAKE these medications    Accu-Chek Guide test strip Generic drug: glucose blood USE TO CHECK BLOOD SUGAR IN THE  MORNING, AT NOON, AND AT BEDTIME   atenolol 25 MG tablet Commonly known as: TENORMIN TAKE 1 TABLET BY MOUTH DAILY   atorvastatin 20 MG tablet Commonly known as: LIPITOR TAKE 1 TABLET BY MOUTH ONCE  DAILY   buPROPion 300 MG  24 hr tablet Commonly known as: WELLBUTRIN XL TAKE 1 TABLET BY MOUTH DAILY   Combivent Respimat 20-100 MCG/ACT Aers respimat Generic drug: Ipratropium-Albuterol Inhale 1 puff into the lungs every 6 (six) hours as needed for wheezing or shortness of breath.   diazepam 5 MG tablet Commonly known as: VALIUM Take 1 tablet (5 mg total) by mouth every 6 (six) hours as needed for anxiety.   Drs Choice Diabetic Bandages Kit Apply 8 kits topically 3 (three) times daily before meals.   fluconazole 150 MG tablet Commonly known as: Diflucan Take 1 tablet (150 mg total) by mouth daily.   fluticasone 50 MCG/ACT nasal spray Commonly known as: FLONASE Place 2 sprays into both nostrils daily as needed for allergies.   furosemide 20 MG tablet Commonly known as: LASIX Take 20 mg by mouth daily as needed.   guaiFENesin-dextromethorphan 100-10 MG/5ML syrup Commonly known as: ROBITUSSIN DM Take 10 mLs by mouth every 8 (eight) hours.   lactobacillus acidophilus & bulgar chewable tablet Chew 1 tablet by mouth 3 (three) times daily with meals for 5 days.   levofloxacin 750 MG tablet Commonly known as: LEVAQUIN Take 1 tablet (750 mg  total) by mouth daily for 3 days. Start taking on: February 03, 2023   levothyroxine 25 MCG tablet Commonly known as: SYNTHROID TAKE 1 TABLET BY MOUTH DAILY  BEFORE BREAKFAST   MELATONIN PO Take 10 mg by mouth at bedtime as needed (sleep).   metformin 1000 MG (OSM) 24 hr tablet Commonly known as: FORTAMET Take 1 tablet (1,000 mg total) by mouth daily with breakfast.   methylPREDNISolone 8 MG tablet Commonly known as: MEDROL Take 3 tablets (24 mg total) by mouth daily for 2 days, THEN 2 tablets (16 mg total) daily for 2 days, THEN 1 tablet (8 mg total) daily for 2 days, THEN 0.5 tablets (4 mg total) daily for 2 days. Start taking on: February 03, 2023   montelukast 10 MG tablet Commonly known as: SINGULAIR TAKE 1 TABLET BY MOUTH AT  BEDTIME    oxyCODONE-acetaminophen 5-325 MG tablet Commonly known as: PERCOCET/ROXICET Take 1-2 tablets by mouth every 6 (six) hours as needed for severe pain (pain score 7-10).   OXYGEN Inhale 3 L/min into the lungs See admin instructions. Inhale  3 L/min of oxygen into the lungs w/CPAP at bedtime and as needed for shortness of breath with "strenuous activity" during the day   pantoprazole 40 MG tablet Commonly known as: PROTONIX TAKE 1 TABLET BY MOUTH DAILY   polyethylene glycol 17 g packet Commonly known as: MIRALAX / GLYCOLAX Take 17 g by mouth daily as needed for mild constipation or moderate constipation.   pregabalin 300 MG capsule Commonly known as: LYRICA TAKE 1 CAPSULE BY MOUTH TWICE  DAILY   QUEtiapine 100 MG tablet Commonly known as: SEROQUEL TAKE 1 TABLET BY MOUTH AT  BEDTIME   roflumilast 500 MCG Tabs tablet Commonly known as: DALIRESP Take 1 tablet (500 mcg total) by mouth daily.   silver sulfADIAZINE 1 % cream Commonly known as: Silvadene Apply 1 Application topically daily.   traZODone 100 MG tablet Commonly known as: DESYREL Take 100 mg by mouth at bedtime.   venlafaxine XR 150 MG 24 hr capsule Commonly known as: EFFEXOR-XR TAKE 1 CAPSULE BY MOUTH DAILY  WITH BREAKFAST               Durable Medical Equipment  (From admission, onward)           Start     Ordered   02/02/23 1030  DME Glucometer  Once        02/02/23 1029            Discharge Exam: Filed Weights   01/29/23 1353  Weight: 86.2 kg        General:  AAO x 3,  cooperative, no distress;   HEENT:  Normocephalic, PERRL, otherwise with in Normal limits   Neuro:  CNII-XII intact. , normal motor and sensation, reflexes intact   Lungs:   Clear to auscultation BL, Respirations unlabored,  No wheezes / crackles  Cardio:    S1/S2, RRR, No murmure, No Rubs or Gallops   Abdomen:  Soft, non-tender, bowel sounds active all four quadrants, no guarding or peritoneal signs.  Muscular   skeletal:  Limited exam -global generalized weaknesses - in bed, able to move all 4 extremities,   2+ pulses,  symmetric, No pitting edema  Skin:  Dry, warm to touch, negative for any Rashes,  Wounds: Please see nursing documentation  Pressure Injury 08/15/20 Deep Tissue Pressure Injury - Purple or maroon localized area of discolored intact skin or blood-filled blister due to damage of  underlying soft tissue from pressure and/or shear. (Active)  08/15/20   Location:   Location Orientation:   Staging: Deep Tissue Pressure Injury - Purple or maroon localized area of discolored intact skin or blood-filled blister due to damage of underlying soft tissue from pressure and/or shear.  Wound Description (Comments):   Present on Admission: Yes          Condition at discharge: good  The results of significant diagnostics from this hospitalization (including imaging, microbiology, ancillary and laboratory) are listed below for reference.   Imaging Studies: CT Angio Chest PE W and/or Wo Contrast  Result Date: 01/29/2023 CLINICAL DATA:  Left lower chest pain since yesterday with shortness of breath. EXAM: CT ANGIOGRAPHY CHEST WITH CONTRAST TECHNIQUE: Multidetector CT imaging of the chest was performed using the standard protocol during bolus administration of intravenous contrast. Multiplanar CT image reconstructions and MIPs were obtained to evaluate the vascular anatomy. RADIATION DOSE REDUCTION: This exam was performed according to the departmental dose-optimization program which includes automated exposure control, adjustment of the mA and/or kV according to patient size and/or use of iterative reconstruction technique. CONTRAST:  75mL OMNIPAQUE IOHEXOL 350 MG/ML SOLN COMPARISON:  Chest CT, lung cancer screening, 05/02/2022. FINDINGS: Cardiovascular: Study mildly limited relative decreased contrast opacification of peripheral pulmonary arteries and some motion artifact. Allowing for this, there is  no evidence of a pulmonary embolism. Heart is normal in size and configuration. Left coronary artery and circumflex coronary artery calcifications. No pericardial effusion. Great vessels are normal in caliber. Mild aortic atherosclerosis. No dissection. Arch branch vessels are widely patent. Mediastinum/Nodes: No neck base, mediastinal or hilar masses. No enlarged lymph nodes. Trachea and esophagus are unremarkable. Lungs/Pleura: Advanced centrilobular emphysema. Patchy airspace opacity along the posterior left upper lobe and peripheral left lower lobe with associated interstitial thickening, new since the prior CT. No other areas of lung consolidation. No pulmonary edema. No pleural effusion or pneumothorax. Upper Abdomen: No acute abnormality. Musculoskeletal: No fracture or acute finding. No suspicious bone lesion. Review of the MIP images confirms the above findings. IMPRESSION: 1. No evidence of a pulmonary embolism. 2. Patchy airspace opacity along the posterior left upper lobe and peripheral left lower lobe with associated interstitial thickening, new since the prior CT. This is consistent with pneumonia. 3. Advanced centrilobular emphysema. 4. Coronary artery calcifications. Aortic Atherosclerosis (ICD10-I70.0) and Emphysema (ICD10-J43.9). Electronically Signed   By: Amie Portland M.D.   On: 01/29/2023 16:45   DG Chest Portable 1 View  Result Date: 01/29/2023 CLINICAL DATA:  Dyspnea.  Chest pain. EXAM: PORTABLE CHEST 1 VIEW COMPARISON:  December 05, 2022. FINDINGS: Stable cardiomediastinal silhouette. Right lung is clear. Mild left basilar opacity is noted concerning for pneumonia or atelectasis with small left pleural effusion. Bony thorax is unremarkable. IMPRESSION: Mild left basilar opacity is noted concerning for pneumonia or atelectasis with small left pleural effusion. Electronically Signed   By: Lupita Raider M.D.   On: 01/29/2023 15:45    Microbiology: Results for orders placed or performed  during the hospital encounter of 01/29/23  Culture, blood (Routine X 2) w Reflex to ID Panel     Status: None (Preliminary result)   Collection Time: 01/29/23  6:18 PM   Specimen: BLOOD  Result Value Ref Range Status   Specimen Description BLOOD LEFT ANTECUBITAL  Final   Special Requests   Final    BOTTLES DRAWN AEROBIC AND ANAEROBIC Blood Culture adequate volume   Culture   Final    NO GROWTH  4 DAYS Performed at [facility], [address] [place], [place] [address]    Report Status PENDING  Incomplete  Culture, blood (Routine X 2) w Reflex to ID Panel     Status: None (Preliminary result)   Collection Time: 01/29/23  6:18 PM   Specimen: BLOOD  Result Value Ref Range Status   Specimen Description BLOOD BLOOD RIGHT HAND  Final   Special Requests   Final    BOTTLES DRAWN AEROBIC ONLY Blood Culture results may not be optimal due to an inadequate volume of blood received in culture bottles   Culture   Final    NO GROWTH 4 DAYS Performed at [facility], [address], [place], [place] [address]    Report Status PENDING  Incomplete   *Note: Due to a large number of results and/or encounters for the requested time period, some results have not been displayed. A complete set of results can be found in Results Review.    Labs: CBC: Recent Labs  Lab 01/29/23 1421 01/30/23 0419 01/31/23 0423 02/01/23 0339 02/02/23 0455  WBC 26.5* 18.3* 16.8* 11.1* 10.8*  NEUTROABS 23.3*  --   --   --   --   HGB 12.3 11.7* 10.9* 10.8* 12.2  HCT 37.8 36.1 33.7* 33.7* 38.1  MCV 89.8 89.4 90.3 90.6 90.7  PLT 218 203 219 225 213   Basic Metabolic Panel: Recent Labs  Lab 01/29/23 1421 01/30/23 0419 01/31/23 0423 02/01/23 0339 02/02/23 0455  NA 135 133* 136 132* 136  K 3.5 3.1* 3.8 3.6 3.6  CL 94* 95* 98 94* 95*  CO2 33* 27 27 28  32  GLUCOSE 148* 280* 278* 416* 149*  BUN 9 12 24* 22 21  CREATININE 0.74 0.71 0.79 0.78 0.79  CALCIUM 9.3 9.0 9.3 9.0 9.1   Liver Function Tests: Recent Labs   Lab 01/29/23 1421  AST 12*  ALT 14  ALKPHOS 107  BILITOT 0.8  PROT 6.9  ALBUMIN 3.6   CBG: Recent Labs  Lab 02/01/23 1148 02/01/23 1629 02/01/23 2126 02/02/23 0714  GLUCAP 426* 113* 339* 145*    Discharge time spent: greater than 40 minutes.  Signed: Kendell Bane, MD [facility] 02/02/2023

## 2023-02-02 NOTE — TOC Transition Note (Signed)
Transition of Care [facility]) - CM/SW Discharge Note   Patient Details  Name: Joan [name] MRN: [id number] Date of Birth: 11-18-1960  Transition of Care (TOC) CM/SW Contact:  Catalina Gravel, LCSW Phone Number: 02/02/2023, 11:42 AM   Clinical Narrative:    Pt medically ready for DC, needs EMS transport home. CSW added pt to list and completed medical necessity form.  Notified nursing staff via [place] that patient added to list.  No further TOC needs.      Barriers to Discharge: Continued Medical Work up   Patient Goals and CMS Choice   Choice offered to / list presented to : Patient  Discharge Placement                         Discharge Plan and Services Additional resources added to the After Visit Summary for   In-house Referral: Clinical Social Work   Post Acute Care Choice: Resumption of Svcs/PTA Provider                               Social Determinants of Health (SDOH) Interventions SDOH Screenings   Food Insecurity: No Food Insecurity (01/30/2023)  Housing: Patient Declined (01/30/2023)  Transportation Needs: No Transportation Needs (01/30/2023)  Utilities: Not At Risk (01/30/2023)  Alcohol Screen: Low Risk  (08/30/2022)  Depression (PHQ2-9): Low Risk  (08/30/2022)  Financial Resource Strain: High Risk (08/30/2022)  Physical Activity: Inactive (08/30/2022)  Social Connections: Moderately Isolated (08/30/2022)  Stress: No Stress Concern Present (08/30/2022)  Recent Concern: Stress - Stress Concern Present (08/12/2022)  Tobacco Use: Medium Risk (01/30/2023)  Health Literacy: Low Risk  (02/18/2022)   Received from [facility]     Readmission Risk Interventions    01/31/2023    2:52 PM 09/13/2022   10:53 AM  Readmission Risk Prevention Plan  Transportation Screening Complete Complete  HRI or Home Care Consult  Complete  Social Work Consult for Recovery Care Planning/Counseling  Complete  Palliative Care Screening  Not Applicable   Medication Review Oceanographer) Complete Complete  HRI or Home Care Consult Complete   SW Recovery Care/Counseling Consult Complete   Palliative Care Screening Complete   Skilled Nursing Facility Not Applicable

## 2023-02-03 ENCOUNTER — Telehealth: Payer: Self-pay | Admitting: *Deleted

## 2023-02-03 ENCOUNTER — Other Ambulatory Visit (HOSPITAL_BASED_OUTPATIENT_CLINIC_OR_DEPARTMENT_OTHER): Payer: Self-pay

## 2023-02-03 LAB — CULTURE, BLOOD (ROUTINE X 2)
Culture: NO GROWTH
Culture: NO GROWTH
Special Requests: ADEQUATE

## 2023-02-03 LAB — LEGIONELLA PNEUMOPHILA SEROGP 1 UR AG: L. pneumophila Serogp 1 Ur Ag: NEGATIVE

## 2023-02-03 MED ORDER — COMBIVENT RESPIMAT 20-100 MCG/ACT IN AERS: 1.0000 | INHALATION_SPRAY | Freq: Four times a day (QID) | RESPIRATORY_TRACT | 3 refills | Status: DC | PRN

## 2023-02-03 NOTE — Transitions of Care (Post Inpatient/ED Visit) (Signed)
   02/03/2023  Name: Joan [name] MRN: [id number] DOB: 07/13/1960  Today's TOC FU Call Status: Today's TOC FU Call Status:: Successful TOC FU Call Completed TOC FU Call Complete Date: 02/03/23  TOC initial outreach review completed: review of EHR indicates patient remains active with [facility] at [facility]: Care Coordination outreach to verify- contacted [facility] at [phone]; Spoke with Victorino Dike- nurse at [facility]; verified patient remains active with hospice services at home; [facility] call deferred accordingly   Patient's Name and Date of Birth confirmed.  Transition Care Management Follow-up Telephone Call Date of Discharge: 02/02/23 (verified patient remains active hospice at home: [name]) [facility] Type of Discharge: Inpatient Admission  Items Reviewed:    Medications Reviewed Today:    Home Care and Equipment/Supplies:    Functional Questionnaire:    Follow up appointments reviewed:      Caryl Pina, RN, BSN, CCRN Alumnus RN Care Manager  Transitions of Care  [facility] [phone]: direct office

## 2023-02-08 ENCOUNTER — Other Ambulatory Visit: Payer: Self-pay | Admitting: Internal Medicine

## 2023-02-25 ENCOUNTER — Other Ambulatory Visit: Payer: Self-pay | Admitting: Internal Medicine

## 2023-03-07 ENCOUNTER — Other Ambulatory Visit: Payer: Self-pay | Admitting: Internal Medicine

## 2023-03-07 ENCOUNTER — Other Ambulatory Visit: Payer: Self-pay | Admitting: Pulmonary Disease

## 2023-03-10 ENCOUNTER — Telehealth: Payer: Self-pay

## 2023-03-10 NOTE — Telephone Encounter (Signed)
 Copied from CRM [id number]. Topic: General - Other >> Mar 10, 2023  3:07 PM Burnard DEL wrote:  Reason for CRM: patient called in stating that her apartments where she live at  [facility] is needing a letter stating that she should have her mail delivered to her apartment so that she doesn't have to go a long distance due to her being in a wheel chair. She would like to know if the letter could be mailed to her?

## 2023-03-11 ENCOUNTER — Ambulatory Visit (HOSPITAL_BASED_OUTPATIENT_CLINIC_OR_DEPARTMENT_OTHER): Payer: Medicare Other | Admitting: Pulmonary Disease

## 2023-03-11 ENCOUNTER — Encounter (HOSPITAL_BASED_OUTPATIENT_CLINIC_OR_DEPARTMENT_OTHER): Payer: Self-pay | Admitting: Pulmonary Disease

## 2023-03-11 VITALS — BP 122/72 | HR 83 | Resp 16 | Ht 68.0 in | Wt 200.2 lb

## 2023-03-11 DIAGNOSIS — G4733 Obstructive sleep apnea (adult) (pediatric): Secondary | ICD-10-CM

## 2023-03-11 DIAGNOSIS — J432 Centrilobular emphysema: Secondary | ICD-10-CM | POA: Diagnosis not present

## 2023-03-11 DIAGNOSIS — J9611 Chronic respiratory failure with hypoxia: Secondary | ICD-10-CM

## 2023-03-11 MED ORDER — BREZTRI AEROSPHERE 160-9-4.8 MCG/ACT IN AERO: 2.0000 | INHALATION_SPRAY | Freq: Two times a day (BID) | RESPIRATORY_TRACT | 3 refills | Status: DC

## 2023-03-11 NOTE — Progress Notes (Signed)
 Subjective:    Patient ID: Joan [name], female    DOB: March 05, 1960, 63 y.o.   MRN: [id number]  HPI  63 yo smoker for follow-up of severe COPD and OSA -on CPAP 10 cm -Enrolled in home hospice   Meds -allergy listed to prednisone -this caused pedal edema, prefers Medrol  when needed   PMH : HTN CAD - calcium  score 727.  Chief Complaint  Patient presents with   Follow-up    OSA--Doing better started using it again has been off it for a while. Hospital gave her dulera  and incruse but she wants to stay with breztri  and symbicort . She hasn't taken it since in hospital.    Discussed the use of AI scribe software for clinical note transcription with the patient, who gave verbal consent to proceed.  History of Present Illness   The patient, with a history of severe COPD and sleep apnea, presents for a follow-up visit. The patient was hospitalized twice in the past few months, once in December for pneumonia and once in October for a severe exacerbation of COPD. The patient reports difficulty walking short distances due to exhaustion and relies on oxygen  for support. The patient uses inhalers for COPD management and has recently quit smoking. The patient is interested in trying a lung cleanser supplement to help clear the lungs. The patient also uses a CPAP machine for sleep apnea but reports dry mouth as a side effect. The patient is interested in trying a new COPD medication called Ohtuwayre       CTA chest 01/29/23 ASD LUL & LLL c/w pneumonia  Significant tests/ events reviewed NPSG 02/2017 >> no OSA HST  04/2017 15/h     02/2019 pFTS >> RATIO 45, fev 1 37% - UNCHANGED , + bd response, DLCO 44%      10/2016 PFTs showed ratio 49, FEV1 of 36%, FVC 58% and DLCO of 34% consistent with severe obstruction.   04/2021 LDCT >> stable, RADS 2   02/2020 LDCT >>  stable nodules  11/2016 CT chest bullous changes BL, 4mm RUl & 2 mm LUL  nodules >> stable      ONO 11/2016 on CPAP/room air showed  desaturation for 1 hour 45 minutes started on nocturnal oxygen >>   Review of Systems neg for any significant sore throat, dysphagia, itching, sneezing, nasal congestion or excess/ purulent secretions, fever, chills, sweats, unintended wt loss, pleuritic or exertional cp, hempoptysis, orthopnea pnd or change in chronic leg swelling. Also denies presyncope, palpitations, heartburn, abdominal pain, nausea, vomiting, diarrhea or change in bowel or urinary habits, dysuria,hematuria, rash, arthralgias, visual complaints, headache, numbness weakness or ataxia.     Objective:   Physical Exam  Gen. Pleasant, obese, in no distress ENT - no lesions, no post nasal drip, dry mucosa Neck: No JVD, no thyromegaly, no carotid bruits Lungs: no use of accessory muscles, no dullness to percussion, decreased without rales or rhonchi  Cardiovascular: Rhythm regular, heart sounds  normal, no murmurs or gallops, no peripheral edema Musculoskeletal: No deformities, no cyanosis or clubbing , no tremors       Assessment & Plan:    Assessment and Plan    chronic resp failure with hypoxia   -ct O2 24/7   Chronic Obstructive Pulmonary Disease (COPD) Severe COPD with recent exacerbations. Hospitalized in October for severe exacerbation and in December for pneumonia. Currently experiencing significant dyspnea with short distances and using Breztri  inhaler. Considering new medication O2 Wear for better management. Discussed that O2 Wear  is a new nebulizer medication, potentially expensive, and will be run through insurance for coverage. Explained that it cannot be mixed with other nebulizer medications and will be used once daily. Patient prefers to try this new medication. - Renew Breztri  prescription - Initiate O2 Wear nebulizer medication once daily - Discontinue Combivent  - Prescribe albuterol  inhaler for rescue use  OSA on CPAP Sleep apnea managed with CPAP machine. Reports dry mouth and issues with  humidity settings. Advised to adjust humidity settings to 50-60% and ensure proper setup to avoid water  in tubing. Discussed the importance of using the humidity feature to alleviate dryness. - Adjust CPAP humidity settings to 50-60% - Ensure proper setup of CPAP machine to avoid water  in tubing  General Health Maintenance Has quit smoking since Christmas, which is a positive lifestyle change. Discussed the use of a lung cleanser tablet, which is not expected to harm but may not provide significant benefit. - Continue lung cleanser tablet once daily - Encourage continued smoking cessation  Follow-up - Discuss new medication coverage with hospice nurse - Write note for mail delivery accommodation due to limited mobility.

## 2023-03-11 NOTE — Patient Instructions (Signed)
 X Letter for post office  Trial of new neb medication - Ohtuwayre -  [place] for Breztri to [facility]  Change from combivent to Albuterol MDI -2 puffs q 6h as needed

## 2023-03-14 ENCOUNTER — Encounter: Payer: Self-pay | Admitting: Internal Medicine

## 2023-03-14 ENCOUNTER — Telehealth: Payer: Self-pay | Admitting: Internal Medicine

## 2023-03-14 NOTE — Telephone Encounter (Signed)
We have discussed this patient once before and I believe there are forms placed inside your box about her wheel chair

## 2023-03-14 NOTE — Telephone Encounter (Signed)
Ok to do letter

## 2023-03-14 NOTE — Telephone Encounter (Signed)
Done and dropped into the mailbox

## 2023-03-14 NOTE — Telephone Encounter (Signed)
Copied from CRM [id number]. Topic: General - Other >> Mar 14, 2023 10:22 AM Deaijah H wrote: Reason for CRM: Patient called in wanting to speak with a nurse regarding her motorized wheelchair  needing to be fixed, it is needed for Dr. Okey Dupre to send a work order on what needs to be fixed on the wheelchair is the left arm, new battery & one wheel smaller than other in front (fax number [phone] w/ Doreene Eland) / if needed, please call [phone]

## 2023-03-17 ENCOUNTER — Telehealth: Payer: Self-pay | Admitting: Pharmacist

## 2023-03-17 NOTE — Telephone Encounter (Signed)
Received new start paperwork for [facility]. Faxed to [employer] with insurance card copy and clinicals  Phone#: [phone] Fax#: [phone]  Chesley Mires, PharmD, MPH, BCPS, CPP Clinical Pharmacist (Rheumatology and Pulmonology)

## 2023-03-18 DIAGNOSIS — G4733 Obstructive sleep apnea (adult) (pediatric): Secondary | ICD-10-CM | POA: Diagnosis not present

## 2023-03-20 NOTE — Telephone Encounter (Signed)
Copied from CRM [id number]. Topic: Clinical - Medical Advice >> Mar 20, 2023  2:42 PM Leavy Cella D wrote: Reason for CRM: Patient stated that [facility] and [employer] has sent a form for patient to receive a new battery and arm for her wheelchair form needs to be signed by Dr.Crawford and sent back as soon as possible . [employer] : fax:[phone]/ phone:[phone]  patient would like a call back when form has been signed and sent back to company [phone]

## 2023-03-21 NOTE — Telephone Encounter (Signed)
This has been signed and fax back

## 2023-03-21 NOTE — Telephone Encounter (Signed)
Placed inside providers office box

## 2023-03-21 NOTE — Telephone Encounter (Signed)
2nd set of forms received and placed in provider box

## 2023-03-24 ENCOUNTER — Telehealth (HOSPITAL_COMMUNITY): Payer: Medicare Other | Admitting: Psychiatry

## 2023-03-24 ENCOUNTER — Encounter (HOSPITAL_COMMUNITY): Payer: Self-pay

## 2023-03-25 DIAGNOSIS — G4733 Obstructive sleep apnea (adult) (pediatric): Secondary | ICD-10-CM | POA: Diagnosis not present

## 2023-03-25 DIAGNOSIS — R490 Dysphonia: Secondary | ICD-10-CM | POA: Diagnosis not present

## 2023-03-25 DIAGNOSIS — R1314 Dysphagia, pharyngoesophageal phase: Secondary | ICD-10-CM | POA: Diagnosis not present

## 2023-03-25 DIAGNOSIS — Z87891 Personal history of nicotine dependence: Secondary | ICD-10-CM | POA: Diagnosis not present

## 2023-03-25 DIAGNOSIS — J439 Emphysema, unspecified: Secondary | ICD-10-CM | POA: Diagnosis not present

## 2023-03-25 DIAGNOSIS — Z9981 Dependence on supplemental oxygen: Secondary | ICD-10-CM | POA: Diagnosis not present

## 2023-03-29 ENCOUNTER — Other Ambulatory Visit: Payer: Self-pay | Admitting: Internal Medicine

## 2023-03-29 ENCOUNTER — Other Ambulatory Visit (HOSPITAL_COMMUNITY): Payer: Self-pay | Admitting: Psychiatry

## 2023-04-08 ENCOUNTER — Other Ambulatory Visit: Payer: Self-pay | Admitting: Internal Medicine

## 2023-04-14 ENCOUNTER — Telehealth (HOSPITAL_COMMUNITY): Payer: Medicare Other | Admitting: Psychiatry

## 2023-04-14 ENCOUNTER — Encounter (HOSPITAL_COMMUNITY): Payer: Self-pay

## 2023-04-23 ENCOUNTER — Other Ambulatory Visit (HOSPITAL_COMMUNITY): Payer: Self-pay | Admitting: Psychiatry

## 2023-05-04 ENCOUNTER — Other Ambulatory Visit: Payer: Self-pay | Admitting: Internal Medicine

## 2023-05-05 ENCOUNTER — Ambulatory Visit (HOSPITAL_COMMUNITY): Admission: RE | Admit: 2023-05-05 | Source: Ambulatory Visit

## 2023-05-05 ENCOUNTER — Ambulatory Visit (HOSPITAL_COMMUNITY)

## 2023-05-14 ENCOUNTER — Encounter (HOSPITAL_BASED_OUTPATIENT_CLINIC_OR_DEPARTMENT_OTHER): Payer: Self-pay

## 2023-05-14 ENCOUNTER — Other Ambulatory Visit: Payer: Self-pay | Admitting: Internal Medicine

## 2023-05-15 ENCOUNTER — Institutional Professional Consult (permissible substitution) (INDEPENDENT_AMBULATORY_CARE_PROVIDER_SITE_OTHER): Payer: Medicare Other | Admitting: Otolaryngology

## 2023-05-16 ENCOUNTER — Encounter (HOSPITAL_BASED_OUTPATIENT_CLINIC_OR_DEPARTMENT_OTHER): Payer: Self-pay | Admitting: Pulmonary Disease

## 2023-05-20 ENCOUNTER — Ambulatory Visit: Attending: Physician Assistant | Admitting: Physician Assistant

## 2023-05-20 ENCOUNTER — Encounter: Payer: Self-pay | Admitting: Physician Assistant

## 2023-05-20 VITALS — BP 138/81 | HR 68 | Ht 68.0 in | Wt 206.2 lb

## 2023-05-20 DIAGNOSIS — J449 Chronic obstructive pulmonary disease, unspecified: Secondary | ICD-10-CM

## 2023-05-20 DIAGNOSIS — I10 Essential (primary) hypertension: Secondary | ICD-10-CM | POA: Diagnosis not present

## 2023-05-20 DIAGNOSIS — I25119 Atherosclerotic heart disease of native coronary artery with unspecified angina pectoris: Secondary | ICD-10-CM

## 2023-05-20 NOTE — Progress Notes (Unsigned)
  Cardiology Office Note:  .   Date:  05/20/2023  ID:  Joan [name], DOB Oct 22, 1960, MRN [id number] PCP: Myrlene Broker, MD  [facility] Providers Cardiologist:  Nanetta Batty, MD { Click to update primary MD,subspecialty MD or APP then REFRESH:1}   History of Present Illness: .   Joan [name] is a 64 y.o. female with past medical history of bipolar disorder, hyperlipidemia, history of tobacco abuse, right carotid artery stenosis, COPD and history of nonobstructive CAD.  She never had a heart attack or stroke.  She has significant family history of CAD with her mother had stents and the as well as all 5 sisters.  She was told she had a 50% right carotid artery stenosis by remote ultrasound.  She had multiple hospitalization for COPD exacerbation.  Coronary CTA in November 2020 showed coronary calcium score in the 700 range with nonobstructive CAD.  Echocardiogram performed in June 2017 showed normal EF without significant valve issue.  She is on 24 7 oxygen and wheelchair-bound with minimal activity.  She was last seen by Dr. Gery Pray in September 2024.  Patient was most recently hospitalized in 1 pneumonia in December 2024.  It was noted at the time that patient has terminal COPD and currently on hospice.  She is on 5 L.  CTA of the chest negative for PE.  She was treated with IV steroid and IV antibiotic.  She did have urinary retention during the hospitalization.  She also had pleuritic chest pain secondary to pneumonia.  Patient presents today for follow-up.  She continued to have intermittent chest discomfort for the past 6 months to 1 year.  This symptom does not have clear correlation with physical activity.  Previous coronary CT showed nonobstructive disease.  She is also quite concerned about her carotid artery disease.  She says she believes it is actually closer to 80% instead of 50%.  I did not hear a carotid bruit on exam.  I do not think the patient is a good  candidate for any surgery.  Therefore I did not try to order a carotid ultrasound.  She can follow-up with Dr. Gery Pray in 6 months.  She has been enrolled in hospice services for at least the past 6 months.  ROS: ***  Studies Reviewed: .        *** Risk Assessment/Calculations:   {Does this patient have ATRIAL FIBRILLATION?:[phone]}         Physical Exam:   VS:  BP 138/81   Pulse 68   Ht 5\' 8"  (1.727 m)   Wt 206 lb 3.2 oz (93.5 kg)   SpO2 93%   BMI 31.35 kg/m    Wt Readings from Last 3 Encounters:  05/20/23 206 lb 3.2 oz (93.5 kg)  03/11/23 200 lb 3.2 oz (90.8 kg)  01/29/23 190 lb (86.2 kg)    GEN: Well nourished, well developed in no acute distress NECK: No JVD; No carotid bruits CARDIAC: ***RRR, no murmurs, rubs, gallops RESPIRATORY:  Clear to auscultation without rales, wheezing or rhonchi  ABDOMEN: Soft, non-tender, non-distended EXTREMITIES:  No edema; No deformity   ASSESSMENT AND PLAN: .   ***    {Are you ordering a CV Procedure (e.g. stress test, cath, DCCV, TEE, etc)?   Press F2        :[id number]}  Dispo: ***  Signed, Azalee Course, PA

## 2023-05-20 NOTE — Patient Instructions (Signed)
 Medication Instructions:  NO CHANGES *If you need a refill on your cardiac medications before your next appointment, please call your pharmacy*   Lab Work: NO LABS If you have labs (blood work) drawn today and your tests are completely normal, you will receive your results only by: MyChart Message (if you have MyChart) OR A paper copy in the mail If you have any lab test that is abnormal or we need to change your treatment, we will call you to review the results.   Testing/Procedures: NO TESTING   Follow-Up: At [facility], you and your health needs are our priority.  As part of our continuing mission to provide you with exceptional heart care, we have created designated Provider Care Teams.  These Care Teams include your primary Cardiologist (physician) and Advanced Practice Providers (APPs -  Physician Assistants and Nurse Practitioners) who all work together to provide you with the care you need, when you need it.  Your next appointment:   6 month(s)  Provider:   Nanetta Batty, MD   Other Instructions

## 2023-05-25 ENCOUNTER — Other Ambulatory Visit (HOSPITAL_COMMUNITY): Payer: Self-pay | Admitting: Psychiatry

## 2023-05-28 ENCOUNTER — Other Ambulatory Visit: Payer: Self-pay | Admitting: Internal Medicine

## 2023-05-28 NOTE — Telephone Encounter (Signed)
 Copied from CRM [id number]. Topic: Clinical - Medication Refill >> May 28, 2023  2:19 PM Sim Boast F wrote: Most Recent Primary Care Visit:  Provider: Hillard Danker A  Department: [facility]  Visit Type: OFFICE VISIT  Date: 12/02/2022  Medication: pregabalin   Has the patient contacted their pharmacy? Yes (Agent: If no, request that the patient contact the pharmacy for the refill. If patient does not wish to contact the pharmacy document the reason why and proceed with request.) (Agent: If yes, when and what did the pharmacy advise?)  Is this the correct pharmacy for this prescription? Yes If no, delete pharmacy and type the correct one.  This is the patient's preferred pharmacy:   [facility] - [place], [place] - [address] [place] [address] Phone: [phone] Fax: [phone]    Has the prescription been filled recently? Yes  Is the patient out of the medication? No, patient has two pills left   Has the patient been seen for an appointment in the last year OR does the patient have an upcoming appointment? Yes  Can we respond through MyChart? Yes  Agent: Please be advised that Rx refills may take up to 3 business days. We ask that you follow-up with your pharmacy.

## 2023-05-28 NOTE — Telephone Encounter (Signed)
 Duplicate request. See refill request today: Interface, Surescripts Out routed this conversation to [employer] [facility] [employer] encounter and cancelled reorder.

## 2023-05-29 ENCOUNTER — Ambulatory Visit: Admitting: Family Medicine

## 2023-05-29 ENCOUNTER — Telehealth (HOSPITAL_COMMUNITY): Admitting: Psychiatry

## 2023-06-03 ENCOUNTER — Ambulatory Visit (HOSPITAL_COMMUNITY): Admitting: Psychiatry

## 2023-06-05 ENCOUNTER — Ambulatory Visit (HOSPITAL_BASED_OUTPATIENT_CLINIC_OR_DEPARTMENT_OTHER): Payer: Medicare Other | Admitting: Pulmonary Disease

## 2023-06-09 ENCOUNTER — Telehealth (HOSPITAL_COMMUNITY): Payer: Self-pay | Admitting: *Deleted

## 2023-06-09 MED ORDER — QUETIAPINE FUMARATE 100 MG PO TABS: 100.0000 mg | ORAL_TABLET | Freq: Every day | ORAL | 2 refills | Status: DC

## 2023-06-09 NOTE — Telephone Encounter (Signed)
 Rx Request  [facility] - [place], [place] - [address]    QUEtiapine (SEROQUEL) 100 MG tablet   Next Appt  06/17/23 Last Appt   12/16/22

## 2023-06-09 NOTE — Addendum Note (Signed)
 Addended by: Wray Heady on: 06/09/2023 10:02 AM   Modules accepted: Orders

## 2023-06-10 ENCOUNTER — Other Ambulatory Visit (HOSPITAL_COMMUNITY): Payer: Self-pay | Admitting: Psychiatry

## 2023-06-16 ENCOUNTER — Other Ambulatory Visit (HOSPITAL_COMMUNITY): Payer: Self-pay | Admitting: Psychiatry

## 2023-06-17 ENCOUNTER — Encounter (HOSPITAL_COMMUNITY): Payer: Self-pay | Admitting: Psychiatry

## 2023-06-17 ENCOUNTER — Ambulatory Visit (INDEPENDENT_AMBULATORY_CARE_PROVIDER_SITE_OTHER): Admitting: Psychiatry

## 2023-06-17 VITALS — BP 162/101 | HR 84

## 2023-06-17 DIAGNOSIS — F4310 Post-traumatic stress disorder, unspecified: Secondary | ICD-10-CM

## 2023-06-17 DIAGNOSIS — F0630 Mood disorder due to known physiological condition, unspecified: Secondary | ICD-10-CM | POA: Diagnosis not present

## 2023-06-17 DIAGNOSIS — F411 Generalized anxiety disorder: Secondary | ICD-10-CM

## 2023-06-17 NOTE — Progress Notes (Signed)
 [facility] Follow up visit  Patient Identification: Joan [name] MRN:  [id number] Date of Evaluation:  06/17/2023 Referral Source:Therapist Dallie Duel Chief Complaint:   Chief Complaint   Follow-up    depression follow up  Visit Diagnosis:    ICD-10-CM   1. Mood disorder in conditions classified elsewhere  F06.30     2. GAD (generalized anxiety disorder)  F41.1     3. PTSD (post-traumatic stress disorder)  F43.10         History of Present Illness: Patient is a 63 years old currently widow Caucasian female initially referred by her therapist for management of mood symptoms, depression possible PTSD and anxiety  Patient has been in rehab and following with providers in regard to Coronary health and COPD  She has exacerbation that worries her but tries to manage things  She lives by herself and has some helpers she pays to come  Some AH and feels ghosts are there but says they do not bother me.  She states AH are not worse nor commanding  Gets lonely   Aggravating factor; difficult childhood, grief,difficult relationship with middle kid, hip replacement medical comorbidity, lonlines Modifying factor: brother,family  Duration adult life Severity ; manageable     Past Psychiatric History: depression, drug use  Previous Psychotropic Medications: Yes   Substance Abuse History in the last 12 months:  No.  Consequences of Substance Abuse: history of extensive drug use uptil 8 years ago and have been in rehab programs  Past Medical History:  Past Medical History:  Diagnosis Date   Acute respiratory failure with hypoxia (HCC)    Alcoholism and drug addiction in family    Anxiety    Appendicitis    Arthritis    "knees, hips, lower back" (09/25/2016)   Asthma    Bipolar disorder (HCC)    Chronic bronchitis (HCC)    "all the time" (09/25/2016)   Chronic leg pain    RLE   Chronic lower back pain    COPD (chronic obstructive pulmonary disease) (HCC)    Depression     Diabetes mellitus without complication (HCC)    Diabetic neuropathy (HCC)    Early cataracts, bilateral    Emphysema of lung (HCC)    Generalized anxiety disorder 02/09/2020   GERD (gastroesophageal reflux disease)    Headache    Hepatitis C    "treated back in 2001-2002"   High cholesterol    Hypertension    Hypothyroidism    Incarcerated ventral hernia 04/04/2017   Mood disorder in conditions classified elsewhere 02/09/2020   On home oxygen  therapy    "2L; at nighttime" (04/04/2017)   OSA on CPAP    CPAP with Oxygen  3 liters   Paranoid (HCC)    Pneumonia    "all the time" (09/25/2016)   Positive PPD    with negative chest x ray   PTSD (post-traumatic stress disorder) 02/09/2020   Tachycardia    patient reports with exertion ; denies any other conditions    Thyroid  disease    Wears glasses    Wears partial dentures     Past Surgical History:  Procedure Laterality Date   ANTERIOR CERVICAL DECOMP/DISCECTOMY FUSION N/A 02/14/2022   Procedure: Anterior Cervical Decompression/Discectomy Fusion Cervical Three-Cervical Four;  Surgeon: Van Gelinas, MD;  Location: [facility] OR;  Service: Neurosurgery;  Laterality: N/A;  3C   APPENDECTOMY     BACK SURGERY     BIOPSY  11/24/2018   Procedure: BIOPSY;  Surgeon: Savannah Curlin,  Jullie Oiler, MD;  Location: [facility];  Service: Gastroenterology;;   CARDIAC CATHETERIZATION     COLONOSCOPY     COLONOSCOPY WITH PROPOFOL  N/A 11/24/2018   Procedure: COLONOSCOPY WITH PROPOFOL ;  Surgeon: Lindle Rhea, MD;  Location: [facility];  Service: Gastroenterology;  Laterality: N/A;   CYST EXCISION     removal of cyst in sinuses   DILATION AND CURETTAGE OF UTERUS     ESOPHAGOGASTRODUODENOSCOPY (EGD) WITH PROPOFOL  N/A 11/24/2018   Procedure: ESOPHAGOGASTRODUODENOSCOPY (EGD) WITH PROPOFOL ;  Surgeon: Lindle Rhea, MD;  Location: [facility];  Service: Gastroenterology;  Laterality: N/A;   FOOT SURGERY Bilateral    bone removed from 4th and 5 th  toes and put in pin then took pin out   HERNIA REPAIR     INCISION AND DRAINAGE  ~ 2014/2015   "removed mesh & infection"   INCISIONAL HERNIA REPAIR N/A 04/04/2017   Procedure: LAPAROSCOPIC INCISIONAL HERNIA REPAIR WITH MESH;  Surgeon: Boyce Byes, MD;  Location: [facility] OR;  Service: General;  Laterality: N/A;   LACERATION REPAIR Right    repair wrist artery from laceration from ice skate   LAPAROSCOPIC APPENDECTOMY N/A 03/31/2019   Procedure: APPENDECTOMY LAPAROSCOPIC;  Surgeon: Shela Derby, MD;  Location: [facility]S;  Service: General;  Laterality: N/A;   LAPAROSCOPIC APPENDECTOMY N/A 07/09/2019   Procedure: APPENDECTOMY LAPAROSCOPIC;  Surgeon: Shela Derby, MD;  Location: [facility] OR;  Service: General;  Laterality: N/A;   LAPAROSCOPIC INCISIONAL / UMBILICAL / VENTRAL HERNIA REPAIR  04/04/2017   LAPAROSCOPIC INCISIONAL HERNIA REPAIR WITH MESH   LIPOMA EXCISION Right    fattty lipoma in neck   NASAL SEPTUM SURGERY     POSTERIOR LUMBAR FUSION  2015/2016 X 2   "added rods and screws"   SINOSCOPY     TOTAL HIP ARTHROPLASTY Right 02/05/2017   Procedure: RIGHT TOTAL HIP ARTHROPLASTY;  Surgeon: Hazle Lites, MD;  Location: [facility];  Service: Orthopedics;  Laterality: Right;   TOTAL HIP ARTHROPLASTY Left 06/18/2017   Procedure: LEFT TOTAL HIP ARTHROPLASTY;  Surgeon: Hazle Lites, MD;  Location: [facility];  Service: Orthopedics;  Laterality: Left;   TOTAL HIP REVISION Left 01/26/2019   Procedure: TOTAL HIP REVISION;  Surgeon: Claiborne Crew, MD;  Location: [facility];  Service: Orthopedics;  Laterality: Left;  2 hrs   TUBAL LIGATION     UMBILICAL HERNIA REPAIR  ~ 2013   w/mesh   [facility] ENDOSCOPY      Family Psychiatric History: FAther : alcohol use  Family History:  Family History  Problem Relation Age of Onset   Diabetes Mother    Hypertension Mother    Hypertension Father    Cancer Father        lung   Breast cancer Neg Hx     Social History:   Social History   Socioeconomic  History   Marital status: Widowed    Spouse name: Not on file   Number of children: 3   Years of education: Not on file   Highest education level: 9th grade  Occupational History   Not on file  Tobacco Use   Smoking status: Former    Current packs/day: 2.00    Average packs/day: 2.0 packs/day for 48.3 years (96.6 ttl pk-yrs)    Types: Cigarettes    Start date: 1977    Passive exposure: Current   Smokeless tobacco: Never   Tobacco comments:    1ppd as of 07/30/22 ep  Vaping Use   Vaping status: Never Used  Substance  and Sexual Activity   Alcohol use: Not Currently   Drug use: Not Currently    Types: "Crack" cocaine    Comment:  "nothing since 01/12/2012"   Sexual activity: Not Currently    Partners: Male  Other Topics Concern   Not on file  Social History Narrative   Not on file   Social Drivers of Health   Financial Resource Strain: High Risk (08/30/2022)   Overall Financial Resource Strain (CARDIA)    Difficulty of Paying Living Expenses: Hard  Food Insecurity: No Food Insecurity (01/30/2023)   Hunger Vital Sign    Worried About Running Out of Food in the Last Year: Never true    Ran Out of Food in the Last Year: Never true  Transportation Needs: No Transportation Needs (01/30/2023)   PRAPARE - Administrator, Civil Service (Medical): No    Lack of Transportation (Non-Medical): No  Physical Activity: Inactive (08/30/2022)   Exercise Vital Sign    Days of Exercise per Week: 0 days    Minutes of Exercise per Session: 0 min  Stress: No Stress Concern Present (08/30/2022)   [employer] of Occupational Health - Occupational Stress Questionnaire    Feeling of Stress : Only a little  Recent Concern: Stress - Stress Concern Present (08/12/2022)   [employer] of Occupational Health - Occupational Stress Questionnaire    Feeling of Stress : Very much  Social Connections: Moderately Isolated (08/30/2022)   Social Connection and Isolation Panel [NHANES]     Frequency of Communication with Friends and Family: Once a week    Frequency of Social Gatherings with Friends and Family: Once a week    Attends Religious Services: 1 to 4 times per year    Active Member of [employer] or Organizations: Yes    Attends Banker Meetings: 1 to 4 times per year    Marital Status: Widowed      Allergies:   Allergies  Allergen Reactions   Keflex [Cephalexin] Rash and Other (See Comments)    Has tolerated amoxicillin  since had keflex   Shellfish Allergy Shortness Of Breath, Nausea And Vomiting and Other (See Comments)    Stomach cramps   Tetracyclines & Related Anaphylaxis, Swelling and Other (See Comments)    Throat swelling requiring hospitalization   Dilaudid  [Hydromorphone  Hcl] Other (See Comments)    "Lethargy"   Prednisone  Other (See Comments)    "counter reacts", has tolerated IV medrol    Clindamycin /Lincomycin Other (See Comments)    Burning sensation in her mouth down her throat    Incruse Ellipta  [Umeclidinium Bromide ] Nausea Only   Molds & Smuts Itching    "Allergic to everything", pt said she is allergic to most environmental allergens   Tuberculin Tests Other (See Comments)    False postive   Umeclidinium     Other Reaction(s): GI Intolerance    Metabolic Disorder Labs: Lab Results  Component Value Date   HGBA1C 6.8 (H) 09/08/2022   MPG 148 09/08/2022   MPG 154 02/08/2022   No results found for: "PROLACTIN" Lab Results  Component Value Date   CHOL 140 06/11/2021   TRIG 98.0 06/11/2021   HDL 51.10 06/11/2021   CHOLHDL 3 06/11/2021   VLDL 19.6 06/11/2021   LDLCALC 69 06/11/2021   LDLCALC 58 03/07/2020   Lab Results  Component Value Date   TSH 0.93 06/11/2021    Therapeutic Level Labs: No results found for: "LITHIUM" No results found for: "CBMZ" No results found for: "  VALPROATE"  Current Medications: Current Outpatient Medications  Medication Sig Dispense Refill   ACCU-CHEK GUIDE test strip USE TO CHECK  BLOOD SUGAR IN THE  MORNING, AT NOON, AND AT BEDTIME 100 strip 9   albuterol  (PROVENTIL ) (2.5 MG/3ML) 0.083% nebulizer solution Take 2.5 mg by nebulization 2 (two) times daily.     atenolol  (TENORMIN ) 25 MG tablet TAKE 1 TABLET BY MOUTH DAILY 100 tablet 1   atorvastatin  (LIPITOR) 20 MG tablet TAKE 1 TABLET BY MOUTH ONCE  DAILY 100 tablet 2   Budeson-Glycopyrrol-Formoterol  (BREZTRI  AEROSPHERE) 160-9-4.8 MCG/ACT AERO Inhale 2 puffs into the lungs 2 (two) times daily. 3 each 3   buPROPion  (WELLBUTRIN  XL) 300 MG 24 hr tablet TAKE 1 TABLET BY MOUTH DAILY 90 tablet 0   diazepam  (VALIUM ) 5 MG tablet Take 1 tablet (5 mg total) by mouth every 6 (six) hours as needed for anxiety.     fluconazole  (DIFLUCAN ) 150 MG tablet Take 1 tablet (150 mg total) by mouth daily. 1 tablet 1   furosemide  (LASIX ) 20 MG tablet Take 20 mg by mouth daily as needed.     guaiFENesin -dextromethorphan  (ROBITUSSIN DM) 100-10 MG/5ML syrup Take 10 mLs by mouth every 8 (eight) hours. 118 mL 0   levothyroxine  (SYNTHROID ) 25 MCG tablet TAKE 1 TABLET BY MOUTH DAILY  BEFORE BREAKFAST 100 tablet 0   MELATONIN PO Take 10 mg by mouth at bedtime as needed (sleep).     montelukast  (SINGULAIR ) 10 MG tablet TAKE 1 TABLET BY MOUTH AT  BEDTIME 100 tablet 2   oxyCODONE -acetaminophen  (PERCOCET/ROXICET) 5-325 MG tablet Take 1-2 tablets by mouth every 6 (six) hours as needed for severe pain (pain score 7-10).     OXYGEN  Inhale 3 L/min into the lungs See admin instructions. Inhale  3 L/min of oxygen  into the lungs w/CPAP at bedtime and as needed for shortness of breath with "strenuous activity" during the day     pantoprazole  (PROTONIX ) 40 MG tablet TAKE 1 TABLET BY MOUTH DAILY 100 tablet 2   polyethylene glycol (MIRALAX  / GLYCOLAX ) 17 g packet Take 17 g by mouth daily as needed for mild constipation or moderate constipation.     pregabalin  (LYRICA ) 300 MG capsule TAKE 1 CAPSULE BY MOUTH TWICE  DAILY 60 capsule 3   QUEtiapine  (SEROQUEL ) 100 MG tablet  Take 1 tablet (100 mg total) by mouth at bedtime. 30 tablet 2   roflumilast  (DALIRESP ) 500 MCG TABS tablet Take 1 tablet (500 mcg total) by mouth daily. 90 tablet 3   silver  sulfADIAZINE  (SILVADENE ) 1 % cream Apply 1 Application topically daily. 25 g 1   traZODone  (DESYREL ) 100 MG tablet Take 100 mg by mouth at bedtime.     venlafaxine  XR (EFFEXOR -XR) 150 MG 24 hr capsule TAKE 1 CAPSULE BY MOUTH DAILY  WITH BREAKFAST 100 capsule 2   Wound Dressings (DRS CHOICE DIABETIC BANDAGES) KIT Apply 8 kits topically 3 (three) times daily before meals. 8 kit 10   fluticasone  (FLONASE ) 50 MCG/ACT nasal spray Place 2 sprays into both nostrils daily as needed for allergies.     metformin  (FORTAMET ) 1000 MG (OSM) 24 hr tablet Take 1 tablet (1,000 mg total) by mouth daily with breakfast. 30 tablet 1   No current facility-administered medications for this visit.     Psychiatric Specialty Exam: Review of Systems  Cardiovascular:  Negative for chest pain.  Psychiatric/Behavioral:  Negative for substance abuse and suicidal ideas.     Blood pressure (!) 162/101, pulse 84.There is no height  or weight on file to calculate BMI.  General Appearance:casual  Eye Contact: fair  Speech:  Slow  Volume:  Decreased  Mood: fair  Affect:  congruent  Thought Process:  Goal Directed  Orientation:  Full (Time, Place, and Person)  Thought Content: somewhat paranoid and has AH not worse  Suicidal Thoughts:  No  Homicidal Thoughts:  No  Memory:  Immediate;   Fair Recent;   Fair  Judgement:  Fair  Insight:  Shallow  Psychomotor Activity:  Decreased  Concentration:  Concentration: Fair and Attention Span: Fair  Recall:  [employer] of Knowledge:Fair  Language: Fair  Akathisia:  No  Handed:    AIMS (if indicated): no involuntary movements  Assets:  Desire for Improvement Leisure Time Social Support  ADL's: limited due to recent surgery  Cognition: WNL  Sleep:   variable   Screenings: Mini-Mental    Flowsheet  Row Clinical Support from 04/06/2018 in [facility]  Total Score (max 30 points ) 29      PHQ2-9    Flowsheet Row Clinical Support from 08/30/2022 in [facility] [place] Office Visit from 08/13/2022 in [facility] Office Visit from 10/12/2021 in [facility] Clinical Support from 08/27/2021 in [facility] Chronic Care Management from 05/04/2021 in [facility]  PHQ-2 Total Score 0 0 2 0 1  PHQ-9 Total Score -- -- 2 -- --      Flowsheet Row ED to Hosp-Admission (Discharged) from 01/29/2023 in [facility] ED to Hosp-Admission (Discharged) from 12/05/2022 in [facility] ED to Hosp-Admission (Discharged) from 10/09/2022 in [facility]  C-SSRS RISK CATEGORY No Risk No Risk No Risk       Assessment and Plan: as follows  Prior documentation reviewed  Mood disorder unspecified: relavant to multiple medical concerns and past history of using drugs,  Feels depression is manageable despite her co morbid medical conditions Continue effexor  and seroquel . No involuntary movements She doesn't want to lower any meds as feel it keeps her mental health in balance No palpitations or chest pain. Patient advised to follow with providers and keep observation of any fluctuation in BP. She is on meds   PTSD: baseline continue effexor   Insomnia:reviewed sleep hygiene. Continue trazadone and seroquel  for mood and sleep   Refills sent if due  Direct care time spent 20 plus minutes FU 4 - 6 m.   Wray Heady, MD 4/22/20253:47 PM

## 2023-07-03 ENCOUNTER — Other Ambulatory Visit (HOSPITAL_COMMUNITY): Payer: Self-pay | Admitting: Psychiatry

## 2023-07-14 DIAGNOSIS — J441 Chronic obstructive pulmonary disease with (acute) exacerbation: Secondary | ICD-10-CM | POA: Diagnosis not present

## 2023-07-14 DIAGNOSIS — J449 Chronic obstructive pulmonary disease, unspecified: Secondary | ICD-10-CM | POA: Diagnosis not present

## 2023-07-14 DIAGNOSIS — G4733 Obstructive sleep apnea (adult) (pediatric): Secondary | ICD-10-CM | POA: Diagnosis not present

## 2023-07-14 DIAGNOSIS — J9611 Chronic respiratory failure with hypoxia: Secondary | ICD-10-CM | POA: Diagnosis not present

## 2023-07-24 ENCOUNTER — Other Ambulatory Visit (HOSPITAL_COMMUNITY): Payer: Self-pay | Admitting: Psychiatry

## 2023-07-28 ENCOUNTER — Ambulatory Visit (INDEPENDENT_AMBULATORY_CARE_PROVIDER_SITE_OTHER)

## 2023-07-28 VITALS — Ht 68.0 in | Wt 206.0 lb

## 2023-07-28 DIAGNOSIS — Z0000 Encounter for general adult medical examination without abnormal findings: Secondary | ICD-10-CM

## 2023-07-28 NOTE — Progress Notes (Signed)
 Subjective:   [name] is a 63 y.o. who presents for a Medicare Wellness preventive visit.  As a reminder, Annual Wellness Visits don't include a physical exam, and some assessments may be limited, especially if this visit is performed virtually. We may recommend an in-person follow-up visit with your provider if needed.  Visit Complete: Virtual I connected with  [name] on 07/28/23 by a audio enabled telemedicine application and verified that I am speaking with the correct person using two identifiers.  Patient Location: Home  Provider Location: Home Office  I discussed the limitations of evaluation and management by telemedicine. The patient expressed understanding and agreed to proceed.  Vital Signs: Because this visit was a virtual/telehealth visit, some criteria may be missing or patient reported. Any vitals not documented were not able to be obtained and vitals that have been documented are patient reported.  VideoDeclined- This patient declined Librarian, academic. Therefore the visit was completed with audio only.  Persons Participating in Visit: Patient.  AWV Questionnaire: No: Patient Medicare AWV questionnaire was not completed prior to this visit.  Cardiac Risk Factors include: advanced age (>51men, >20 women);hypertension;Other (see comment);dyslipidemia, Risk factor comments: PAD, CAD, COPD, OSA     Objective:     Today's Vitals   07/28/23 1412  Weight: 206 lb (93.4 kg)  Height: 5\' 8"  (1.727 m)   Body mass index is 31.32 kg/m.     07/28/2023    2:44 PM 06/17/2023    3:45 PM 01/29/2023    1:54 PM 12/05/2022    7:44 PM 10/09/2022   11:24 PM 10/09/2022    6:20 PM 09/14/2022    8:05 AM  Advanced Directives  Does Patient Have a Medical Advance Directive? Yes  No No Yes Yes Yes  Type of Estate agent of [place];Living will    Healthcare Power of [employer] Power of [employer]  Power of Attorney  Does patient want to make changes to medical advance directive? No - Patient declined    No - Patient declined  No - Patient declined  Copy of Healthcare Power of Attorney in Chart? Yes - validated most recent copy scanned in chart (See row information)    No - copy requested  No - copy requested  Would patient like information on creating a medical advance directive?   No - Patient declined No - Patient declined        Information is confidential and restricted. Go to Review Flowsheets to unlock data.    Current Medications (verified) Outpatient Encounter Medications as of 07/28/2023  Medication Sig   ACCU-CHEK GUIDE test strip USE TO CHECK BLOOD SUGAR IN THE  MORNING, AT NOON, AND AT BEDTIME   albuterol  (PROVENTIL ) (2.5 MG/3ML) 0.083% nebulizer solution Take 2.5 mg by nebulization 2 (two) times daily.   atenolol  (TENORMIN ) 25 MG tablet TAKE 1 TABLET BY MOUTH DAILY   atorvastatin  (LIPITOR) 20 MG tablet TAKE 1 TABLET BY MOUTH ONCE  DAILY   Budeson-Glycopyrrol-Formoterol  (BREZTRI  AEROSPHERE) 160-9-4.8 MCG/ACT AERO Inhale 2 puffs into the lungs 2 (two) times daily.   buPROPion  (WELLBUTRIN  XL) 300 MG 24 hr tablet TAKE 1 TABLET BY MOUTH DAILY   diazepam  (VALIUM ) 5 MG tablet Take 1 tablet (5 mg total) by mouth every 6 (six) hours as needed for anxiety.   fluconazole  (DIFLUCAN ) 150 MG tablet Take 1 tablet (150 mg total) by mouth daily.   furosemide  (LASIX ) 20 MG tablet Take 20 mg by  mouth daily as needed.   guaiFENesin -dextromethorphan  (ROBITUSSIN DM) 100-10 MG/5ML syrup Take 10 mLs by mouth every 8 (eight) hours.   levothyroxine  (SYNTHROID ) 25 MCG tablet TAKE 1 TABLET BY MOUTH DAILY  BEFORE BREAKFAST   MELATONIN PO Take 10 mg by mouth at bedtime as needed (sleep).   montelukast  (SINGULAIR ) 10 MG tablet TAKE 1 TABLET BY MOUTH AT  BEDTIME   oxyCODONE -acetaminophen  (PERCOCET/ROXICET) 5-325 MG tablet Take 1-2 tablets by mouth every 6 (six) hours as needed for severe pain (pain score  7-10).   OXYGEN  Inhale 3 L/min into the lungs See admin instructions. Inhale  3 L/min of oxygen  into the lungs w/CPAP at bedtime and as needed for shortness of breath with "strenuous activity" during the day   pantoprazole  (PROTONIX ) 40 MG tablet TAKE 1 TABLET BY MOUTH DAILY   polyethylene glycol (MIRALAX  / GLYCOLAX ) 17 g packet Take 17 g by mouth daily as needed for mild constipation or moderate constipation.   pregabalin  (LYRICA ) 300 MG capsule TAKE 1 CAPSULE BY MOUTH TWICE  DAILY   QUEtiapine  (SEROQUEL ) 100 MG tablet TAKE 1 TABLET BY MOUTH AT  BEDTIME   roflumilast  (DALIRESP ) 500 MCG TABS tablet Take 1 tablet (500 mcg total) by mouth daily.   silver  sulfADIAZINE  (SILVADENE ) 1 % cream Apply 1 Application topically daily.   traZODone  (DESYREL ) 100 MG tablet Take 100 mg by mouth at bedtime.   venlafaxine  XR (EFFEXOR -XR) 150 MG 24 hr capsule TAKE 1 CAPSULE BY MOUTH DAILY  WITH BREAKFAST   Wound Dressings (DRS CHOICE DIABETIC BANDAGES) KIT Apply 8 kits topically 3 (three) times daily before meals.   fluticasone  (FLONASE ) 50 MCG/ACT nasal spray Place 2 sprays into both nostrils daily as needed for allergies.   metformin  (FORTAMET ) 1000 MG (OSM) 24 hr tablet Take 1 tablet (1,000 mg total) by mouth daily with breakfast.   No facility-administered encounter medications on file as of 07/28/2023.    Allergies (verified) Keflex [cephalexin], Shellfish allergy, Tetracyclines & related, Dilaudid  [hydromorphone  hcl], Prednisone , Clindamycin /lincomycin, Incruse ellipta  [umeclidinium bromide ], Molds & smuts, Tuberculin tests, and Umeclidinium   History: Past Medical History:  Diagnosis Date   Acute respiratory failure with hypoxia (HCC)    Alcoholism and drug addiction in family    Anxiety    Appendicitis    Arthritis    "knees, hips, lower back" (09/25/2016)   Asthma    Bipolar disorder (HCC)    Chronic bronchitis (HCC)    "all the time" (09/25/2016)   Chronic leg pain    RLE   Chronic lower back  pain    COPD (chronic obstructive pulmonary disease) (HCC)    Depression    Diabetes mellitus without complication (HCC)    Diabetic neuropathy (HCC)    Early cataracts, bilateral    Emphysema of lung (HCC)    Generalized anxiety disorder 02/09/2020   GERD (gastroesophageal reflux disease)    Headache    Hepatitis C    "treated back in 2001-2002"   High cholesterol    Hypertension    Hypothyroidism    Incarcerated ventral hernia 04/04/2017   Mood disorder in conditions classified elsewhere 02/09/2020   On home oxygen  therapy    "2L; at nighttime" (04/04/2017)   OSA on CPAP    CPAP with Oxygen  3 liters   Paranoid (HCC)    Pneumonia    "all the time" (09/25/2016)   Positive PPD    with negative chest x ray   PTSD (post-traumatic stress disorder) 02/09/2020   Tachycardia  patient reports with exertion ; denies any other conditions    Thyroid  disease    Wears glasses    Wears partial dentures    Past Surgical History:  Procedure Laterality Date   ANTERIOR CERVICAL DECOMP/DISCECTOMY FUSION N/A 02/14/2022   Procedure: Anterior Cervical Decompression/Discectomy Fusion Cervical Three-Cervical Four;  Surgeon: Van Gelinas, MD;  Location[facility];  Service: Neurosurgery;  Laterality: N/A;  3C   APPENDECTOMY     BACK SURGERY     BIOPSY  11/24/2018   Procedure: BIOPSY;  Surgeon: Lindle Rhea, MD;  Location: [facility]COPY;  Service: Gastroenterology;;   CARDIAC CATHETERIZATION     COLONOSCOPY     COLONOSCOPY WITH PROPOFOL  N/A 11/24/2018   Procedure: COLONOSCOPY WITH PROPOFOL ;  Surgeon: Lindle Rhea, MD;  Location: [facility];  Service: Gastroenterology;  Laterality: N/A;   CYST EXCISION     removal of cyst in sinuses   DILATION AND CURETTAGE OF UTERUS     ESOPHAGOGASTRODUODENOSCOPY (EGD) WITH PROPOFOL  N/A 11/24/2018   Procedure: ESOPHAGOGASTRODUODENOSCOPY (EGD) WITH PROPOFOL ;  Surgeon: Lindle Rhea, MD;  Location: [facility];  Service: Gastroenterology;   Laterality: N/A;   FOOT SURGERY Bilateral    bone removed from 4th and 5 th toes and put in pin then took pin out   HERNIA REPAIR     INCISION AND DRAINAGE  ~ 2014/2015   "removed mesh & infection"   INCISIONAL HERNIA REPAIR N/A 04/04/2017   Procedure: LAPAROSCOPIC INCISIONAL HERNIA REPAIR WITH MESH;  Surgeon: Boyce Byes, MD;  Location: [facility] OR;  Service: General;  Laterality: N/A;   LACERATION REPAIR Right    repair wrist artery from laceration from ice skate   LAPAROSCOPIC APPENDECTOMY N/A 03/31/2019   Procedure: APPENDECTOMY LAPAROSCOPIC;  Surgeon: Shela Derby, MD;  Location: [facility];  Service: General;  Laterality: N/A;   LAPAROSCOPIC APPENDECTOMY N/A 07/09/2019   Procedure: APPENDECTOMY LAPAROSCOPIC;  Surgeon: Shela Derby, MD;  Location: [facility] OR;  Service: General;  Laterality: N/A;   LAPAROSCOPIC INCISIONAL / UMBILICAL / VENTRAL HERNIA REPAIR  04/04/2017   LAPAROSCOPIC INCISIONAL HERNIA REPAIR WITH MESH   LIPOMA EXCISION Right    fattty lipoma in neck   NASAL SEPTUM SURGERY     POSTERIOR LUMBAR FUSION  2015/2016 X 2   "added rods and screws"   SINOSCOPY     TOTAL HIP ARTHROPLASTY Right 02/05/2017   Procedure: RIGHT TOTAL HIP ARTHROPLASTY;  Surgeon: Hazle Lites, MD;  Location: [facility];  Service: Orthopedics;  Laterality: Right;   TOTAL HIP ARTHROPLASTY Left 06/18/2017   Procedure: LEFT TOTAL HIP ARTHROPLASTY;  Surgeon: Hazle Lites, MD;  Location: [facility];  Service: Orthopedics;  Laterality: Left;   TOTAL HIP REVISION Left 01/26/2019   Procedure: TOTAL HIP REVISION;  Surgeon: Claiborne Crew, MD;  Location: [facility];  Service: Orthopedics;  Laterality: Left;  2 hrs   TUBAL LIGATION     UMBILICAL HERNIA REPAIR  ~ 2013   w/mesh   UPPER GI ENDOSCOPY     Family History  Problem Relation Age of Onset   Diabetes Mother    Hypertension Mother    Hypertension Father    Cancer Father        lung   Breast cancer Neg Hx    Social History   Socioeconomic History    Marital status: Widowed    Spouse name: Not on file   Number of children: 3   Years of education: Not on file   Highest education level: 9th grade  Occupational History  Occupation: RETIRED  Tobacco Use   Smoking status: Former    Current packs/day: 2.00    Average packs/day: 2.0 packs/day for 48.4 years (96.8 ttl pk-yrs)    Types: Cigarettes    Start date: 1977    Passive exposure: Current   Smokeless tobacco: Never   Tobacco comments:    1ppd as of 07/30/22 ep  Vaping Use   Vaping status: Never Used  Substance and Sexual Activity   Alcohol use: Not Currently   Drug use: Not Currently    Types: "Crack" cocaine    Comment:  "nothing since 01/12/2012"   Sexual activity: Not Currently    Partners: Male  Other Topics Concern   Not on file  Social History Narrative   Lives alone/2025   Social Drivers of Health   Financial Resource Strain: Medium Risk (07/28/2023)   Overall Financial Resource Strain (CARDIA)    Difficulty of Paying Living Expenses: Somewhat hard  Food Insecurity: No Food Insecurity (07/28/2023)   Hunger Vital Sign    Worried About Running Out of Food in the Last Year: Never true    Ran Out of Food in the Last Year: Never true  Transportation Needs: No Transportation Needs (07/28/2023)   PRAPARE - Administrator, Civil Service (Medical): No    Lack of Transportation (Non-Medical): No  Physical Activity: Inactive (07/28/2023)   Exercise Vital Sign    Days of Exercise per Week: 0 days    Minutes of Exercise per Session: 0 min  Stress: Stress Concern Present (07/28/2023)   [employer] of Occupational Health - Occupational Stress Questionnaire    Feeling of Stress : Very much  Social Connections: Socially Isolated (07/28/2023)   Social Connection and Isolation Panel [NHANES]    Frequency of Communication with Friends and Family: Never    Frequency of Social Gatherings with Friends and Family: Never    Attends Religious Services: Never    Automotive engineer or Organizations: No    Attends Banker Meetings: Never    Marital Status: Widowed    Tobacco Counseling Counseling given: Not Answered Tobacco comments: 1ppd as of 07/30/22 ep    Clinical Intake:  Pre-visit preparation completed: Yes  Pain : No/denies pain     BMI - recorded: 31.32 Nutritional Status: BMI > 30  Obese Nutritional Risks: None Diabetes: Yes CBG done?: No Did pt. bring in CBG monitor from home?: No  Lab Results  Component Value Date   HGBA1C 6.8 (H) 09/08/2022   HGBA1C 7.0 (H) 02/08/2022   HGBA1C 6.8 (H) 03/14/2021     How often do you need to have someone help you when you read instructions, pamphlets, or other written materials from your doctor or pharmacy?: 1 - Never     Information entered by :: [place]on Yearby, RMA   Activities of Daily Living     07/28/2023    2:15 PM 12/06/2022   12:00 AM  In your present state of health, do you have any difficulty performing the following activities:  Hearing? 0 0  Vision? 0 0  Difficulty concentrating or making decisions? 0 0  Walking or climbing stairs? 0   Dressing or bathing? 0   Doing errands, shopping? 0 0  Preparing Food and eating ? N   Using the Toilet? N   In the past six months, have you accidently leaked urine? N   Do you have problems with loss of bowel control? N   Managing your  Medications? N   Managing your Finances? N   Housekeeping or managing your Housekeeping? N     Patient Care Team: Adelia Homestead, MD as PCP - General (Internal Medicine) Avanell Leigh, MD as PCP - Cardiology (Cardiology) Lindle Rhea, MD (Inactive) as Consulting Physician (Gastroenterology) Lorena Rolling, MD as Consulting Physician (Ophthalmology) Claiborne Crew, MD as Consulting Physician (Orthopedic Surgery) Szabat, Tino Foreman, [facility] (Inactive) (Pharmacist)  I have updated your Care Teams any recent Medical Services you may have received from other providers in the  past year.     Assessment:    This is a routine wellness examination for Joan [name].  Hearing/Vision screen Hearing Screening - Comments:: Denies hearing difficulties   Vision Screening - Comments:: Wears eyeglasses/No Doctor   Goals Addressed   None    Depression Screen     07/28/2023    2:49 PM 08/30/2022   12:30 PM 08/30/2022   11:15 AM 08/13/2022    2:51 PM 10/12/2021    2:45 PM 08/27/2021    1:42 PM 05/04/2021    1:15 PM  PHQ 2/9 Scores  PHQ - 2 Score 4 0 0 0 2 0 1  PHQ- 9 Score 14    2      Fall Risk     07/28/2023    2:44 PM 12/02/2022   11:24 AM 08/30/2022   12:24 PM 08/30/2022   11:15 AM 08/13/2022    2:51 PM  Fall Risk   Falls in the past year? 1 0 1 1 0  Number falls in past yr: 1 0 0 0 0  Injury with Fall? 0 0 0 0 0  Risk for fall due to : Impaired balance/gait;Medication side effect  History of fall(s) History of fall(s)   Follow up Falls evaluation completed;Falls prevention discussed  Falls evaluation completed Falls evaluation completed Falls evaluation completed    MEDICARE RISK AT HOME:  Medicare Risk at Home Any stairs in or around the home?: No If so, are there any without handrails?: No Home free of loose throw rugs in walkways, pet beds, electrical cords, etc?: Yes Adequate lighting in your home to reduce risk of falls?: Yes Life alert?: Yes Use of a cane, walker or w/c?: Yes Grab bars in the bathroom?: Yes Shower chair or bench in shower?: Yes Elevated toilet seat or a handicapped toilet?: Yes  TIMED UP AND GO:  Was the test performed?  No  Cognitive Function: Declined/Normal: No cognitive concerns noted by patient or family. Patient alert, oriented, able to answer questions appropriately and recall recent events. No signs of memory loss or confusion.    04/06/2018    3:04 PM  MMSE - Mini Mental State Exam  Orientation to time 5  Orientation to Place 5  Registration 3  Attention/ Calculation 4  Recall 3  Language- name 2 objects 2  Language-  repeat 1  Language- follow 3 step command 3  Language- read & follow direction 1  Write a sentence 1  Copy design 1  Total score 29        08/30/2022   12:21 PM 08/27/2021    1:54 PM 05/24/2019    1:43 PM  6CIT Screen  What Year? 0 points 0 points 0 points  What month? 0 points 0 points 0 points  What time? 0 points 0 points 0 points  Count back from 20 0 points 0 points 0 points  Months in reverse 4 points  0 points  Repeat phrase 4  points 2 points 0 points  Total Score 8 points  0 points    Immunizations Immunization History  Administered Date(s) Administered   Influenza,inj,Quad PF,6+ Mos 11/25/2016, 11/11/2017, 11/09/2018, 10/28/2019, 03/15/2021, 11/21/2021   PFIZER(Purple Top)SARS-COV-2 Vaccination 06/04/2019, 06/28/2019   Pfizer Covid-19 Vaccine Bivalent Booster 44yrs & up 04/05/2021   Tdap 12/17/2018    Screening Tests Health Maintenance  Topic Date Due   Pneumococcal Vaccine 37-3 Years old (1 of 2 - PCV) Never done   Zoster Vaccines- Shingrix (1 of 2) Never done   Cervical Cancer Screening (HPV/Pap Cotest)  05/11/2022   COVID-19 Vaccine (4 - 2024-25 season) 10/27/2022   HEMOGLOBIN A1C  03/11/2023   INFLUENZA VACCINE  09/26/2023   FOOT EXAM  12/02/2023   OPHTHALMOLOGY EXAM  12/03/2023   MAMMOGRAM  11/03/2024   Colonoscopy  11/23/2028   DTaP/Tdap/Td (2 - Td or Tdap) 12/16/2028   Hepatitis C Screening  Completed   HIV Screening  Completed   HPV VACCINES  Aged Out   Meningococcal B Vaccine  Aged Out   Lung Cancer Screening  Discontinued    Health Maintenance  Health Maintenance Due  Topic Date Due   Pneumococcal Vaccine 34-26 Years old (1 of 2 - PCV) Never done   Zoster Vaccines- Shingrix (1 of 2) Never done   Cervical Cancer Screening (HPV/Pap Cotest)  05/11/2022   COVID-19 Vaccine (4 - 2024-25 season) 10/27/2022   HEMOGLOBIN A1C  03/11/2023   Health Maintenance Items Addressed: See Nurse Notes at the end of this note  Additional Screening:  Vision  Screening: Recommended annual ophthalmology exams for early detection of glaucoma and other disorders of the eye. Would you like a referral to an eye doctor? Yes    Dental Screening: Recommended annual dental exams for proper oral hygiene  Community Resource Referral / Chronic Care Management: CRR required this visit?  No   CCM required this visit?  No   Plan:    I have personally reviewed and noted the following in the patient's chart:   Medical and social history Use of alcohol, tobacco or illicit drugs  Current medications and supplements including opioid prescriptions. Patient is currently taking opioid prescriptions. Information provided to patient regarding non-opioid alternatives. Patient advised to discuss non-opioid treatment plan with their provider. Functional ability and status Nutritional status Physical activity Advanced directives List of other physicians Hospitalizations, surgeries, and ER visits in previous 12 months Vitals Screenings to include cognitive, depression, and falls Referrals and appointments  In addition, I have reviewed and discussed with patient certain preventive protocols, quality metrics, and best practice recommendations. A written personalized care plan for preventive services as well as general preventive health recommendations were provided to patient.   Falan Hensler L Brinly Maietta, CMA   07/28/2023   After Visit Summary: (MyChart) Due to this being a telephonic visit, the after visit summary with patients personalized plan was offered to patient via MyChart   Notes: Please refer to Routing Comments.

## 2023-07-28 NOTE — Patient Instructions (Signed)
 Joan [name] , Thank you for taking time out of your busy schedule to complete your Annual Wellness Visit with me. I enjoyed our conversation and look forward to speaking with you again next year. I, as well as your care team,  appreciate your ongoing commitment to your health goals. Please review the following plan we discussed and let me know if I can assist you in the future. Your Game plan/ To Do List     Follow up Visits: Next Medicare AWV with our clinical staff: 07/28/2024.   Have you seen your provider in the last 6 months (3 months if uncontrolled diabetes)? Yes Next Office Visit with your provider: 08/25/2023.  Clinician Recommendations:  Aim for 30 minutes of exercise or brisk walking, 6-8 glasses of water , and 5 servings of fruits and vegetables each day. You are due for a A1c check and a pneumonia vaccine, which you can get both during your next office visit with Dr. Nicolette Barrio.      This is a list of the screening recommended for you and due dates:  Health Maintenance  Topic Date Due   Pneumococcal Vaccination (1 of 2 - PCV) Never done   Zoster (Shingles) Vaccine (1 of 2) Never done   Pap with HPV screening  05/11/2022   COVID-19 Vaccine (4 - 2024-25 season) 10/27/2022   Hemoglobin A1C  03/11/2023   Flu Shot  09/26/2023   Complete foot exam   12/02/2023   Eye exam for diabetics  12/03/2023   Mammogram  11/03/2024   Colon Cancer Screening  11/23/2028   DTaP/Tdap/Td vaccine (2 - Td or Tdap) 12/16/2028   Hepatitis C Screening  Completed   HIV Screening  Completed   HPV Vaccine  Aged Out   Meningitis B Vaccine  Aged Out   Screening for Lung Cancer  Discontinued    Advanced directives: (Copy Requested) Please bring a copy of your health care power of attorney and living will to the office to be added to your chart at your convenience. You can mail to [facility] [address] [place], [place] [address] or email to [email or web address] Advance Care  Planning is important because it:  [x]  Makes sure you receive the medical care that is consistent with your values, goals, and preferences  [x]  It provides guidance to your family and loved ones and reduces their decisional burden about whether or not they are making the right decisions based on your wishes.  Follow the link provided in your after visit summary or read over the paperwork we have mailed to you to help you started getting your Advance Directives in place. If you need assistance in completing these, please reach out to us  so that we can help you!  See attachments for Preventive Care and Fall Prevention Tips.

## 2023-08-02 ENCOUNTER — Other Ambulatory Visit: Payer: Self-pay | Admitting: Internal Medicine

## 2023-08-10 ENCOUNTER — Other Ambulatory Visit: Payer: Self-pay | Admitting: Internal Medicine

## 2023-08-11 ENCOUNTER — Ambulatory Visit (HOSPITAL_BASED_OUTPATIENT_CLINIC_OR_DEPARTMENT_OTHER): Admitting: Pulmonary Disease

## 2023-08-14 ENCOUNTER — Encounter (HOSPITAL_COMMUNITY): Payer: Self-pay | Admitting: Emergency Medicine

## 2023-08-14 ENCOUNTER — Other Ambulatory Visit: Payer: Self-pay

## 2023-08-14 ENCOUNTER — Emergency Department (HOSPITAL_COMMUNITY)

## 2023-08-14 ENCOUNTER — Observation Stay (HOSPITAL_COMMUNITY)
Admission: EM | Admit: 2023-08-14 | Discharge: 2023-08-15 | Disposition: A | Discharge status: Hospice | Attending: Family Medicine | Admitting: Family Medicine

## 2023-08-14 ENCOUNTER — Other Ambulatory Visit: Payer: Self-pay | Admitting: Internal Medicine

## 2023-08-14 DIAGNOSIS — K5909 Other constipation: Secondary | ICD-10-CM | POA: Diagnosis present

## 2023-08-14 DIAGNOSIS — R0902 Hypoxemia: Secondary | ICD-10-CM | POA: Diagnosis not present

## 2023-08-14 DIAGNOSIS — E039 Hypothyroidism, unspecified: Secondary | ICD-10-CM | POA: Diagnosis not present

## 2023-08-14 DIAGNOSIS — Z79899 Other long term (current) drug therapy: Secondary | ICD-10-CM

## 2023-08-14 DIAGNOSIS — E785 Hyperlipidemia, unspecified: Secondary | ICD-10-CM | POA: Insufficient documentation

## 2023-08-14 DIAGNOSIS — I779 Disorder of arteries and arterioles, unspecified: Secondary | ICD-10-CM | POA: Diagnosis present

## 2023-08-14 DIAGNOSIS — J431 Panlobular emphysema: Secondary | ICD-10-CM

## 2023-08-14 DIAGNOSIS — J410 Simple chronic bronchitis: Secondary | ICD-10-CM | POA: Diagnosis not present

## 2023-08-14 DIAGNOSIS — M1990 Unspecified osteoarthritis, unspecified site: Secondary | ICD-10-CM | POA: Diagnosis present

## 2023-08-14 DIAGNOSIS — R000 Tachycardia, unspecified: Secondary | ICD-10-CM | POA: Diagnosis not present

## 2023-08-14 DIAGNOSIS — J310 Chronic rhinitis: Secondary | ICD-10-CM | POA: Diagnosis present

## 2023-08-14 DIAGNOSIS — Z6832 Body mass index (BMI) 32.0-32.9, adult: Secondary | ICD-10-CM | POA: Insufficient documentation

## 2023-08-14 DIAGNOSIS — E1140 Type 2 diabetes mellitus with diabetic neuropathy, unspecified: Secondary | ICD-10-CM | POA: Diagnosis not present

## 2023-08-14 DIAGNOSIS — Z720 Tobacco use: Secondary | ICD-10-CM | POA: Insufficient documentation

## 2023-08-14 DIAGNOSIS — R251 Tremor, unspecified: Secondary | ICD-10-CM | POA: Diagnosis not present

## 2023-08-14 DIAGNOSIS — J449 Chronic obstructive pulmonary disease, unspecified: Secondary | ICD-10-CM | POA: Diagnosis present

## 2023-08-14 DIAGNOSIS — R071 Chest pain on breathing: Secondary | ICD-10-CM | POA: Insufficient documentation

## 2023-08-14 DIAGNOSIS — J439 Emphysema, unspecified: Secondary | ICD-10-CM | POA: Diagnosis present

## 2023-08-14 DIAGNOSIS — J9611 Chronic respiratory failure with hypoxia: Secondary | ICD-10-CM | POA: Diagnosis not present

## 2023-08-14 DIAGNOSIS — R339 Retention of urine, unspecified: Secondary | ICD-10-CM | POA: Insufficient documentation

## 2023-08-14 DIAGNOSIS — J849 Interstitial pulmonary disease, unspecified: Secondary | ICD-10-CM | POA: Diagnosis not present

## 2023-08-14 DIAGNOSIS — I10 Essential (primary) hypertension: Secondary | ICD-10-CM | POA: Diagnosis present

## 2023-08-14 DIAGNOSIS — R0781 Pleurodynia: Secondary | ICD-10-CM | POA: Diagnosis present

## 2023-08-14 DIAGNOSIS — I739 Peripheral vascular disease, unspecified: Secondary | ICD-10-CM | POA: Diagnosis present

## 2023-08-14 DIAGNOSIS — E669 Obesity, unspecified: Secondary | ICD-10-CM | POA: Diagnosis not present

## 2023-08-14 DIAGNOSIS — E034 Atrophy of thyroid (acquired): Secondary | ICD-10-CM | POA: Diagnosis not present

## 2023-08-14 DIAGNOSIS — F411 Generalized anxiety disorder: Secondary | ICD-10-CM | POA: Diagnosis present

## 2023-08-14 DIAGNOSIS — K219 Gastro-esophageal reflux disease without esophagitis: Secondary | ICD-10-CM | POA: Diagnosis not present

## 2023-08-14 DIAGNOSIS — F4310 Post-traumatic stress disorder, unspecified: Secondary | ICD-10-CM | POA: Diagnosis present

## 2023-08-14 DIAGNOSIS — I6529 Occlusion and stenosis of unspecified carotid artery: Secondary | ICD-10-CM | POA: Diagnosis not present

## 2023-08-14 DIAGNOSIS — Z7984 Long term (current) use of oral hypoglycemic drugs: Secondary | ICD-10-CM | POA: Insufficient documentation

## 2023-08-14 DIAGNOSIS — F319 Bipolar disorder, unspecified: Secondary | ICD-10-CM | POA: Diagnosis present

## 2023-08-14 DIAGNOSIS — E782 Mixed hyperlipidemia: Secondary | ICD-10-CM | POA: Diagnosis present

## 2023-08-14 DIAGNOSIS — F17200 Nicotine dependence, unspecified, uncomplicated: Secondary | ICD-10-CM

## 2023-08-14 DIAGNOSIS — J441 Chronic obstructive pulmonary disease with (acute) exacerbation: Principal | ICD-10-CM

## 2023-08-14 DIAGNOSIS — R1012 Left upper quadrant pain: Secondary | ICD-10-CM | POA: Insufficient documentation

## 2023-08-14 DIAGNOSIS — G4733 Obstructive sleep apnea (adult) (pediatric): Secondary | ICD-10-CM

## 2023-08-14 DIAGNOSIS — R062 Wheezing: Secondary | ICD-10-CM | POA: Diagnosis not present

## 2023-08-14 DIAGNOSIS — R0602 Shortness of breath: Secondary | ICD-10-CM | POA: Diagnosis not present

## 2023-08-14 DIAGNOSIS — Z96641 Presence of right artificial hip joint: Secondary | ICD-10-CM | POA: Insufficient documentation

## 2023-08-14 DIAGNOSIS — J9621 Acute and chronic respiratory failure with hypoxia: Secondary | ICD-10-CM | POA: Diagnosis not present

## 2023-08-14 DIAGNOSIS — G252 Other specified forms of tremor: Secondary | ICD-10-CM | POA: Diagnosis present

## 2023-08-14 DIAGNOSIS — Z515 Encounter for palliative care: Secondary | ICD-10-CM

## 2023-08-14 LAB — CBC WITH DIFFERENTIAL/PLATELET
Abs Immature Granulocytes: 0.05 10*3/uL (ref 0.00–0.07)
Basophils Absolute: 0.1 10*3/uL (ref 0.0–0.1)
Basophils Relative: 1 %
Eosinophils Absolute: 0.1 10*3/uL (ref 0.0–0.5)
Eosinophils Relative: 1 %
HCT: 40.7 % (ref 36.0–46.0)
Hemoglobin: 12.8 g/dL (ref 12.0–15.0)
Immature Granulocytes: 0 %
Lymphocytes Relative: 4 %
Lymphs Abs: 0.6 10*3/uL — ABNORMAL LOW (ref 0.7–4.0)
MCH: 28.3 pg (ref 26.0–34.0)
MCHC: 31.4 g/dL (ref 30.0–36.0)
MCV: 89.8 fL (ref 80.0–100.0)
Monocytes Absolute: 0.9 10*3/uL (ref 0.1–1.0)
Monocytes Relative: 6 %
Neutro Abs: 12.4 10*3/uL — ABNORMAL HIGH (ref 1.7–7.7)
Neutrophils Relative %: 88 %
Platelets: 237 10*3/uL (ref 150–400)
RBC: 4.53 MIL/uL (ref 3.87–5.11)
RDW: 15.9 % — ABNORMAL HIGH (ref 11.5–15.5)
WBC: 14.1 10*3/uL — ABNORMAL HIGH (ref 4.0–10.5)
nRBC: 0 % (ref 0.0–0.2)

## 2023-08-14 LAB — BLOOD GAS, VENOUS
Acid-Base Excess: 5.3 mmol/L — ABNORMAL HIGH (ref 0.0–2.0)
Bicarbonate: 31.9 mmol/L — ABNORMAL HIGH (ref 20.0–28.0)
Drawn by: 44828
O2 Saturation: 82.7 %
Patient temperature: 37.2
pCO2, Ven: 54 mmHg (ref 44–60)
pH, Ven: 7.38 (ref 7.25–7.43)
pO2, Ven: 53 mmHg — ABNORMAL HIGH (ref 32–45)

## 2023-08-14 LAB — HEMOGLOBIN A1C
Hgb A1c MFr Bld: 6.3 % — ABNORMAL HIGH (ref 4.8–5.6)
Mean Plasma Glucose: 134.11 mg/dL

## 2023-08-14 LAB — GLUCOSE, CAPILLARY
Glucose-Capillary: 311 mg/dL — ABNORMAL HIGH (ref 70–99)
Glucose-Capillary: 341 mg/dL — ABNORMAL HIGH (ref 70–99)
Glucose-Capillary: 376 mg/dL — ABNORMAL HIGH (ref 70–99)

## 2023-08-14 LAB — BASIC METABOLIC PANEL WITH GFR
Anion gap: 10 (ref 5–15)
BUN: 10 mg/dL (ref 8–23)
CO2: 29 mmol/L (ref 22–32)
Calcium: 9.1 mg/dL (ref 8.9–10.3)
Chloride: 102 mmol/L (ref 98–111)
Creatinine, Ser: 0.9 mg/dL (ref 0.44–1.00)
GFR, Estimated: >60 mL/min (ref >60)
Glucose, Bld: 207 mg/dL — ABNORMAL HIGH (ref 70–99)
Potassium: 3.7 mmol/L (ref 3.5–5.1)
Sodium: 141 mmol/L (ref 135–145)

## 2023-08-14 LAB — TROPONIN I (HIGH SENSITIVITY)
Troponin I (High Sensitivity): 4 ng/L (ref <18)
Troponin I (High Sensitivity): 3 ng/L (ref <18)

## 2023-08-14 LAB — D-DIMER, QUANTITATIVE: D-Dimer, Quant: 0.7 ug{FEU}/mL — ABNORMAL HIGH (ref 0.00–0.50)

## 2023-08-14 LAB — MRSA NEXT GEN BY PCR, NASAL: MRSA by PCR Next Gen: NOT DETECTED

## 2023-08-14 LAB — BRAIN NATRIURETIC PEPTIDE: B Natriuretic Peptide: 237 pg/mL — ABNORMAL HIGH (ref 0.0–100.0)

## 2023-08-14 MED ORDER — INSULIN GLARGINE-YFGN 100 UNIT/ML ~~LOC~~ SOLN
18.0000 [IU] | Freq: Every day | SUBCUTANEOUS | Status: DC
  Administered 2023-08-14: 18 [IU] via SUBCUTANEOUS
  Filled 2023-08-14: qty 0.18

## 2023-08-14 MED ORDER — METHYLPREDNISOLONE SODIUM SUCC 125 MG IJ SOLR
125.0000 mg | Freq: Once | INTRAMUSCULAR | Status: DC
  Filled 2023-08-14: qty 2

## 2023-08-14 MED ORDER — FENTANYL CITRATE PF 50 MCG/ML IJ SOSY
25.0000 ug | PREFILLED_SYRINGE | INTRAMUSCULAR | Status: DC | PRN
  Administered 2023-08-14: 25 ug via INTRAVENOUS
  Filled 2023-08-14: qty 1

## 2023-08-14 MED ORDER — LORAZEPAM 2 MG/ML IJ SOLN: 0.5000 mg | INTRAMUSCULAR | Status: DC | PRN

## 2023-08-14 MED ORDER — QUETIAPINE FUMARATE 100 MG PO TABS
100.0000 mg | ORAL_TABLET | Freq: Every day | ORAL | Status: DC
  Administered 2023-08-14: 100 mg via ORAL
  Filled 2023-08-14: qty 1

## 2023-08-14 MED ORDER — ATORVASTATIN CALCIUM 20 MG PO TABS
20.0000 mg | ORAL_TABLET | Freq: Every day | ORAL | Status: DC
  Administered 2023-08-15: 20 mg via ORAL
  Filled 2023-08-14: qty 1

## 2023-08-14 MED ORDER — GUAIFENESIN-DM 100-10 MG/5ML PO SYRP
10.0000 mL | ORAL_SOLUTION | Freq: Three times a day (TID) | ORAL | Status: DC
  Administered 2023-08-14 – 2023-08-15 (×3): 10 mL via ORAL
  Filled 2023-08-14 (×4): qty 10

## 2023-08-14 MED ORDER — SODIUM CHLORIDE 0.9 % IV SOLN: 1.0000 g | INTRAVENOUS | Status: DC

## 2023-08-14 MED ORDER — REVEFENACIN 175 MCG/3ML IN SOLN
175.0000 ug | Freq: Every day | RESPIRATORY_TRACT | Status: DC
  Administered 2023-08-14 – 2023-08-15 (×2): 175 ug via RESPIRATORY_TRACT
  Filled 2023-08-14 (×5): qty 3

## 2023-08-14 MED ORDER — ATENOLOL 25 MG PO TABS
25.0000 mg | ORAL_TABLET | Freq: Every day | ORAL | Status: DC
  Administered 2023-08-15: 25 mg via ORAL
  Filled 2023-08-14: qty 1

## 2023-08-14 MED ORDER — ARFORMOTEROL TARTRATE 15 MCG/2ML IN NEBU
15.0000 ug | INHALATION_SOLUTION | Freq: Two times a day (BID) | RESPIRATORY_TRACT | Status: DC
  Administered 2023-08-14 – 2023-08-15 (×3): 15 ug via RESPIRATORY_TRACT
  Filled 2023-08-14 (×4): qty 2

## 2023-08-14 MED ORDER — ACETAMINOPHEN 325 MG PO TABS: 650.0000 mg | ORAL_TABLET | Freq: Four times a day (QID) | ORAL | Status: DC | PRN

## 2023-08-14 MED ORDER — SODIUM CHLORIDE 0.9 % IV SOLN: 100.0000 mg | Freq: Two times a day (BID) | INTRAVENOUS | Status: DC

## 2023-08-14 MED ORDER — CHLORHEXIDINE GLUCONATE CLOTH 2 % EX PADS: 6.0000 | MEDICATED_PAD | Freq: Every day | CUTANEOUS | Status: DC

## 2023-08-14 MED ORDER — METHYLPREDNISOLONE SODIUM SUCC 125 MG IJ SOLR
60.0000 mg | Freq: Two times a day (BID) | INTRAMUSCULAR | Status: DC
  Administered 2023-08-14 – 2023-08-15 (×2): 60 mg via INTRAVENOUS
  Filled 2023-08-14 (×2): qty 2

## 2023-08-14 MED ORDER — LACTATED RINGERS IV BOLUS
1000.0000 mL | Freq: Once | INTRAVENOUS | Status: AC
  Administered 2023-08-14: 1000 mL via INTRAVENOUS

## 2023-08-14 MED ORDER — INSULIN ASPART 100 UNIT/ML IJ SOLN
0.0000 [IU] | Freq: Every day | INTRAMUSCULAR | Status: DC
  Administered 2023-08-14: 5 [IU] via SUBCUTANEOUS

## 2023-08-14 MED ORDER — ENOXAPARIN SODIUM 40 MG/0.4ML IJ SOSY
40.0000 mg | PREFILLED_SYRINGE | INTRAMUSCULAR | Status: DC
  Administered 2023-08-14: 40 mg via SUBCUTANEOUS
  Filled 2023-08-14: qty 0.4

## 2023-08-14 MED ORDER — POLYETHYLENE GLYCOL 3350 17 G PO PACK: 17.0000 g | PACK | Freq: Every day | ORAL | Status: DC | PRN

## 2023-08-14 MED ORDER — PANTOPRAZOLE SODIUM 40 MG PO TBEC
40.0000 mg | DELAYED_RELEASE_TABLET | Freq: Two times a day (BID) | ORAL | Status: DC
  Administered 2023-08-14 – 2023-08-15 (×2): 40 mg via ORAL
  Filled 2023-08-14 (×2): qty 1

## 2023-08-14 MED ORDER — ROFLUMILAST 500 MCG PO TABS
500.0000 ug | ORAL_TABLET | Freq: Every day | ORAL | Status: DC
  Administered 2023-08-14 – 2023-08-15 (×2): 500 ug via ORAL
  Filled 2023-08-14 (×3): qty 1

## 2023-08-14 MED ORDER — BUSPIRONE HCL 5 MG PO TABS
5.0000 mg | ORAL_TABLET | Freq: Two times a day (BID) | ORAL | Status: DC
  Administered 2023-08-14 – 2023-08-15 (×2): 5 mg via ORAL
  Filled 2023-08-14 (×2): qty 1

## 2023-08-14 MED ORDER — OLOPATADINE HCL 0.1 % OP SOLN
1.0000 [drp] | Freq: Two times a day (BID) | OPHTHALMIC | Status: DC
  Administered 2023-08-14 – 2023-08-15 (×2): 1 [drp] via OPHTHALMIC
  Filled 2023-08-14: qty 5

## 2023-08-14 MED ORDER — INSULIN ASPART 100 UNIT/ML IJ SOLN
6.0000 [IU] | Freq: Three times a day (TID) | INTRAMUSCULAR | Status: DC
  Administered 2023-08-14: 6 [IU] via SUBCUTANEOUS

## 2023-08-14 MED ORDER — BUPROPION HCL ER (XL) 150 MG PO TB24
300.0000 mg | ORAL_TABLET | Freq: Every day | ORAL | Status: DC
  Administered 2023-08-14 – 2023-08-15 (×2): 300 mg via ORAL
  Filled 2023-08-14 (×2): qty 2

## 2023-08-14 MED ORDER — LEVOFLOXACIN IN D5W 750 MG/150ML IV SOLN
750.0000 mg | INTRAVENOUS | Status: DC
  Administered 2023-08-14 – 2023-08-15 (×2): 750 mg via INTRAVENOUS
  Filled 2023-08-14 (×2): qty 150

## 2023-08-14 MED ORDER — PREGABALIN 75 MG PO CAPS
300.0000 mg | ORAL_CAPSULE | Freq: Two times a day (BID) | ORAL | Status: DC
  Administered 2023-08-14 – 2023-08-15 (×2): 300 mg via ORAL
  Filled 2023-08-14 (×2): qty 4

## 2023-08-14 MED ORDER — FLUTICASONE PROPIONATE 50 MCG/ACT NA SUSP: 2.0000 | Freq: Every day | NASAL | Status: DC | PRN

## 2023-08-14 MED ORDER — KETOROLAC TROMETHAMINE 15 MG/ML IJ SOLN
15.0000 mg | Freq: Once | INTRAMUSCULAR | Status: AC
  Administered 2023-08-14: 15 mg via INTRAVENOUS
  Filled 2023-08-14: qty 1

## 2023-08-14 MED ORDER — LEVOTHYROXINE SODIUM 25 MCG PO TABS
25.0000 ug | ORAL_TABLET | Freq: Every day | ORAL | Status: DC
  Administered 2023-08-15: 25 ug via ORAL
  Filled 2023-08-14: qty 1

## 2023-08-14 MED ORDER — ALBUTEROL SULFATE (2.5 MG/3ML) 0.083% IN NEBU
10.0000 mg/h | INHALATION_SOLUTION | Freq: Once | RESPIRATORY_TRACT | Status: AC
  Administered 2023-08-14: 10 mg/h via RESPIRATORY_TRACT
  Filled 2023-08-14: qty 3

## 2023-08-14 MED ORDER — BUDESONIDE 0.25 MG/2ML IN SUSP
0.2500 mg | Freq: Two times a day (BID) | RESPIRATORY_TRACT | Status: DC
  Administered 2023-08-14 – 2023-08-15 (×3): 0.25 mg via RESPIRATORY_TRACT
  Filled 2023-08-14 (×4): qty 2

## 2023-08-14 MED ORDER — INSULIN ASPART 100 UNIT/ML IJ SOLN
0.0000 [IU] | Freq: Three times a day (TID) | INTRAMUSCULAR | Status: DC
  Administered 2023-08-14: 15 [IU] via SUBCUTANEOUS
  Administered 2023-08-15: 7 [IU] via SUBCUTANEOUS
  Administered 2023-08-15: 4 [IU] via SUBCUTANEOUS

## 2023-08-14 MED ORDER — IPRATROPIUM-ALBUTEROL 0.5-2.5 (3) MG/3ML IN SOLN: 3.0000 mL | RESPIRATORY_TRACT | Status: DC | PRN

## 2023-08-14 MED ORDER — OXYCODONE HCL 5 MG PO TABS
5.0000 mg | ORAL_TABLET | ORAL | Status: DC | PRN
  Administered 2023-08-14 – 2023-08-15 (×2): 5 mg via ORAL
  Filled 2023-08-14 (×2): qty 1

## 2023-08-14 MED ORDER — TRAZODONE HCL 50 MG PO TABS
100.0000 mg | ORAL_TABLET | Freq: Every day | ORAL | Status: DC
  Administered 2023-08-14: 100 mg via ORAL
  Filled 2023-08-14: qty 2

## 2023-08-14 MED ORDER — DIAZEPAM 5 MG PO TABS: 5.0000 mg | ORAL_TABLET | Freq: Four times a day (QID) | ORAL | Status: DC | PRN

## 2023-08-14 MED ORDER — ACETAMINOPHEN 650 MG RE SUPP: 650.0000 mg | Freq: Four times a day (QID) | RECTAL | Status: DC | PRN

## 2023-08-14 MED ORDER — VENLAFAXINE HCL ER 75 MG PO CP24
150.0000 mg | ORAL_CAPSULE | Freq: Every day | ORAL | Status: DC
  Administered 2023-08-15: 150 mg via ORAL
  Filled 2023-08-14: qty 2

## 2023-08-14 MED ORDER — MAGNESIUM SULFATE 2 GM/50ML IV SOLN
2.0000 g | Freq: Once | INTRAVENOUS | Status: AC
  Administered 2023-08-14: 2 g via INTRAVENOUS
  Filled 2023-08-14: qty 50

## 2023-08-14 MED ORDER — FUROSEMIDE 10 MG/ML IJ SOLN
20.0000 mg | Freq: Every day | INTRAMUSCULAR | Status: DC
  Administered 2023-08-14 – 2023-08-15 (×2): 20 mg via INTRAVENOUS
  Filled 2023-08-14 (×2): qty 2

## 2023-08-14 MED ORDER — MONTELUKAST SODIUM 10 MG PO TABS
10.0000 mg | ORAL_TABLET | Freq: Every day | ORAL | Status: DC
  Administered 2023-08-14: 10 mg via ORAL
  Filled 2023-08-14: qty 1

## 2023-08-14 NOTE — ED Triage Notes (Signed)
 Pt bib [facility]ms for SOB. Pt has hx of COPD. Pts sats were 82 % on 3L initially w/ EMS. Pt took 2 of her home albuterol  txs. EMS gave pt one duo neb, 2.5 albuterol , .5mg  atrovent  @& 125 Solumedrol IV.  Pt sats in triage are 91% on 6L. Respiratory called to place Bipap on pt per edp.

## 2023-08-14 NOTE — Hospital Course (Signed)
 63 year old female on home hospice care for end-stage terminal COPD lung disease, chronic interstitial lung disease, chronic hypoxic respiratory failure oxygen  dependent on 4-5 L/min supplemental oxygen , coronary artery disease, frequent hospitalizations for COPD exacerbations, history of longtime tobacco use, history of urinary retention, history of pleuritic chest pain, hypertension, generalized anxiety disorder, history of bipolar disorder, mood disorder, hypothyroidism, chronic constipation, chronic rhinitis, type 2 diabetes mellitus with neuropathy, GERD, hyperlipidemia, obesity, OSA on CPAP, OA, polypharmacy, PTSD presented to the emergency department by EMS complaining of shortness of breath.  Patient reports she has shortness of breath that woke her up this morning.  She self-administered 2 albuterol  nebulizer treatments at home and got on her's home CPAP and reportedly could not improve her oxygen  saturation above 82%.  She said she stopped smoking but is vaping.  She is also complaining of pain in her left upper quadrant of her abdomen and side.  She called EMS and they administered Solu-Medrol , bronchodilators and after arriving in the ED she was placed on BiPAP therapy before she started to have some improvement.  When she arrived she was tachycardic, tachypneic with diffuse wheezing and obvious respiratory distress.  Fortunately after BiPAP therapy started she improved.  Patient is adamant she wants to return home to home hospice care tomorrow.  She is being admitted for acute on chronic respiratory failure with a COPD exacerbation requiring BiPAP therapy to the stepdown ICU with plans to continue aggressive treatments and if continues to improve likely could resume home hospice therapies tomorrow.

## 2023-08-14 NOTE — Progress Notes (Signed)
 This RT switched patient from a V60 BIPAP to a Servo Air due to previous safety issues with the V60 and hospital policy to not use them. Unit pulled from room.

## 2023-08-14 NOTE — ED Notes (Signed)
 Pt has removed bipap multiple times. Pt now requesting to speak with provider.

## 2023-08-14 NOTE — ED Provider Notes (Signed)
 [place] EMERGENCY DEPARTMENT AT [facility] Provider Note   CSN: [id number] Arrival date & time: 08/14/23  0865     Patient presents with: Shortness of breath   Joan [name] is a 63 y.o. female.  She has history of hypertension, COPD with chronic respiratory failure on 3 L of home oxygen , type 2 diabetes, vocal cord dysfunction, CAD, sleep apnea on CPAP, PTSD.  Patient presents today for shortness of breath, she states it woke her up this morning several hours ago.  She did 2 albuterol  nebulizer treatments and put her CPAP on as well cannot get her oxygen  saturation above 82%.  States she is no longer smoking cigarettes, occasionally uses vape.  She called EMS, they gave her 125 mg of Solu-Medrol , Atrovent , and another albuterol  treatment and put her on 6 L oxygen , currently satting 90 to 92% on room air, still complaining of shortness of breath and intermittent sharp left lateral chest wall pain   HPI     Prior to Admission medications   Medication Sig Start Date End Date Taking? Authorizing Provider  albuterol  (PROVENTIL ) (2.5 MG/3ML) 0.083% nebulizer solution Take 2.5 mg by nebulization 2 (two) times daily. 02/28/23  Yes [provider]  atenolol  (TENORMIN ) 25 MG tablet TAKE 1 TABLET BY MOUTH DAILY 05/15/23  Yes Adelia Homestead, MD  atorvastatin  (LIPITOR) 20 MG tablet TAKE 1 TABLET BY MOUTH ONCE  DAILY 03/10/23  Yes Adelia Homestead, MD  Budeson-Glycopyrrol-Formoterol  (BREZTRI  AEROSPHERE) 160-9-4.8 MCG/ACT AERO Inhale 2 puffs into the lungs 2 (two) times daily. 03/11/23  Yes Lind Repine, MD  buPROPion  (WELLBUTRIN  XL) 300 MG 24 hr tablet TAKE 1 TABLET BY MOUTH DAILY 06/11/23  Yes Wray Heady, MD  busPIRone  (BUSPAR ) 5 MG tablet Take 5 mg by mouth 2 (two) times daily. 08/01/23  Yes [provider]  diazepam  (VALIUM ) 5 MG tablet Take 1 tablet (5 mg total) by mouth every 6 (six) hours as needed for anxiety. 12/07/22  Yes Johnson, Clanford L,  MD  fluticasone  (FLONASE ) 50 MCG/ACT nasal spray Place 2 sprays into both nostrils daily as needed for allergies. 12/07/22 08/14/23 Yes Johnson, Clanford L, MD  furosemide  (LASIX ) 20 MG tablet Take 20 mg by mouth daily as needed. 09/16/22  Yes [provider]  guaiFENesin -dextromethorphan  (ROBITUSSIN DM) 100-10 MG/5ML syrup Take 10 mLs by mouth every 8 (eight) hours. 02/02/23  Yes Shahmehdi, Seyed A, MD  ipratropium-albuterol  (DUONEB) 0.5-2.5 (3) MG/3ML SOLN Take 3 mLs by nebulization every 4 (four) hours as needed (shortness of breath). 05/20/23  Yes [provider]  MELATONIN PO Take 10 mg by mouth at bedtime as needed (sleep).   Yes [provider]  metFORMIN  (GLUCOPHAGE ) 500 MG tablet Take 500 mg by mouth daily. 07/26/23  Yes [provider]  montelukast  (SINGULAIR ) 10 MG tablet TAKE 1 TABLET BY MOUTH AT  BEDTIME 03/08/23  Yes Lind Repine, MD  naproxen  (NAPROSYN ) 500 MG tablet Take 500 mg by mouth daily as needed for moderate pain (pain score 4-6). 03/12/23  Yes [provider]  oxyCODONE -acetaminophen  (PERCOCET/ROXICET) 5-325 MG tablet Take 1-2 tablets by mouth every 6 (six) hours as needed for severe pain (pain score 7-10).   Yes [provider]  OXYGEN  Inhale 3.5 L/min into the lungs continuous. Inhale  3 L/min of oxygen  into the lungs w/CPAP at bedtime and as needed for shortness of breath with strenuous activity during the day   Yes [provider]  pantoprazole  (PROTONIX ) 40 MG  tablet TAKE 1 TABLET BY MOUTH DAILY 08/13/23  Yes Adelia Homestead, MD  PATADAY  0.2 % SOLN Apply 1 drop to eye daily. 05/16/23  Yes [provider]  polyethylene glycol (MIRALAX  / GLYCOLAX ) 17 g packet Take 17 g by mouth daily as needed for mild constipation or moderate constipation.   Yes [provider]  pregabalin  (LYRICA ) 300 MG capsule TAKE 1 CAPSULE BY MOUTH TWICE  DAILY Patient taking differently: Take 300 mg by mouth 2 (two)  times daily. Take 2 tablets (600 mg) daily 06/02/23  Yes Adelia Homestead, MD  QUEtiapine  (SEROQUEL ) 100 MG tablet TAKE 1 TABLET BY MOUTH AT  BEDTIME 07/25/23  Yes Wray Heady, MD  roflumilast  (DALIRESP ) 500 MCG TABS tablet Take 1 tablet (500 mcg total) by mouth daily. 04/02/22  Yes Raejean Bullock, NP  traZODone  (DESYREL ) 100 MG tablet Take 100 mg by mouth at bedtime.   Yes [provider]  venlafaxine  XR (EFFEXOR -XR) 150 MG 24 hr capsule TAKE 1 CAPSULE BY MOUTH DAILY  WITH BREAKFAST 08/13/23  Yes Adelia Homestead, MD  ACCU-CHEK GUIDE TEST test strip USE TO CHECK BLOOD SUGAR IN THE  MORNING AT [facility] AND AT BEDTIME 08/04/23   Adelia Homestead, MD  levothyroxine  (SYNTHROID ) 25 MCG tablet TAKE 1 TABLET BY MOUTH DAILY  BEFORE BREAKFAST 08/14/23   Adelia Homestead, MD  metformin  (FORTAMET ) 1000 MG (OSM) 24 hr tablet Take 1 tablet (1,000 mg total) by mouth daily with breakfast. 02/02/23 04/03/23  Bobbetta Burnet, MD  Wound Dressings (DRS CHOICE DIABETIC BANDAGES) KIT Apply 8 kits topically 3 (three) times daily before meals. 02/02/23   Bobbetta Burnet, MD    Allergies: Keflex [cephalexin], Shellfish allergy, Tetracyclines & related, Dilaudid  [hydromorphone  hcl], Prednisone , Clindamycin /lincomycin, Incruse ellipta  [umeclidinium bromide ], Molds & smuts, Tuberculin tests, and Umeclidinium    Review of Systems  Updated Vital Signs BP (!) 183/84   Pulse (!) 109   Temp 98.9 F (37.2 C) (Axillary)   Resp (!) 25   Ht 5' 8 (1.727 m)   Wt 96.6 kg   SpO2 95%   BMI 32.38 kg/m   Physical Exam Vitals and nursing note reviewed.  Constitutional:      Appearance: She is well-developed. She is ill-appearing.  HENT:     Head: Normocephalic and atraumatic.   Eyes:     Conjunctiva/sclera: Conjunctivae normal.    Cardiovascular:     Rate and Rhythm: Regular rhythm. Tachycardia present.     Heart sounds: No murmur heard. Pulmonary:     Effort: Tachypnea and accessory muscle  usage present.     Breath sounds: Decreased air movement present. Examination of the right-upper field reveals wheezing. Examination of the left-upper field reveals wheezing. Examination of the right-middle field reveals wheezing. Examination of the left-middle field reveals wheezing. Examination of the right-lower field reveals wheezing. Examination of the left-lower field reveals wheezing. Wheezing present.  Abdominal:     Palpations: Abdomen is soft.     Tenderness: There is no abdominal tenderness.   Musculoskeletal:        General: No swelling.     Cervical back: Neck supple.     Right lower leg: No edema.     Left lower leg: No edema.   Skin:    General: Skin is warm and dry.     Capillary Refill: Capillary refill takes less than 2 seconds.   Neurological:     General: No focal deficit present.  Mental Status: She is alert and oriented to person, place, and time.   Psychiatric:        Mood and Affect: Mood normal.     (all labs ordered are listed, but only abnormal results are displayed) Labs Reviewed  BASIC METABOLIC PANEL WITH GFR - Abnormal; Notable for the following components:      Result Value   Glucose, Bld 207 (*)    All other components within normal limits  BRAIN NATRIURETIC PEPTIDE - Abnormal; Notable for the following components:   B Natriuretic Peptide 237.0 (*)    All other components within normal limits  BLOOD GAS, VENOUS - Abnormal; Notable for the following components:   pO2, Ven 53 (*)    Bicarbonate 31.9 (*)    Acid-Base Excess 5.3 (*)    All other components within normal limits  CBC WITH DIFFERENTIAL/PLATELET - Abnormal; Notable for the following components:   WBC 14.1 (*)    RDW 15.9 (*)    Neutro Abs 12.4 (*)    Lymphs Abs 0.6 (*)    All other components within normal limits  D-DIMER, QUANTITATIVE - Abnormal; Notable for the following components:   D-Dimer, Quant 0.70 (*)    All other components within normal limits  HEMOGLOBIN A1C -  Abnormal; Notable for the following components:   Hgb A1c MFr Bld 6.3 (*)    All other components within normal limits  GLUCOSE, CAPILLARY - Abnormal; Notable for the following components:   Glucose-Capillary 311 (*)    All other components within normal limits  GLUCOSE, CAPILLARY - Abnormal; Notable for the following components:   Glucose-Capillary 341 (*)    All other components within normal limits  MRSA NEXT GEN BY PCR, NASAL  COMPREHENSIVE METABOLIC PANEL WITH GFR  MAGNESIUM   CBC WITH DIFFERENTIAL/PLATELET  TROPONIN I (HIGH SENSITIVITY)  TROPONIN I (HIGH SENSITIVITY)    EKG: EKG Interpretation Date/Time:  Thursday August 14 2023 09:23:10 EDT Ventricular Rate:  127 PR Interval:  139 QRS Duration:  98 QT Interval:  314 QTC Calculation: 457 R Axis:   128  Text Interpretation: Sinus tachycardia Ventricular premature complex Confirmed by Kommor, Madison ([id number]) on 08/14/2023 12:34:07 PM  Radiology: [facility] Chest Port 1 View Result Date: 08/14/2023 CLINICAL DATA:  Shortness of breath EXAM: PORTABLE CHEST 1 VIEW COMPARISON:  January 29, 2023 FINDINGS: Prominence of the interstitial markings bilaterally in particularly basilar interstitial prominence correlate with chronic interstitial lung disease. Which correlates with the prior CT chest January 29, 2023. No acute cardiopulmonary infiltrates. Heart and mediastinum normal. IMPRESSION: Chronic interstitial lung disease. No acute cardiopulmonary infiltrates. Electronically Signed   By: Fredrich Jefferson M.D.   On: 08/14/2023 10:29     .Critical Care  Performed by: Aimee Houseman, PA-C Authorized by: Aimee Houseman, PA-C   Critical care provider statement:    Critical care time (minutes):  30   Critical care time was exclusive of:  Separately billable procedures and treating other patients   Critical care was necessary to treat or prevent imminent or life-threatening deterioration of the following conditions:  Respiratory failure    Critical care was time spent personally by me on the following activities:  Development of treatment plan with patient or surrogate, discussions with consultants, evaluation of patient's response to treatment, examination of patient, ordering and review of laboratory studies, ordering and review of radiographic studies, ordering and performing treatments and interventions, pulse oximetry, re-evaluation of patient's condition, review of old charts and obtaining history from patient  or surrogate   Care discussed with: admitting provider     Care discussed with comment:  Dr. Lincoln Renshaw Comments:     Patient is critically ill with acute on chronic hypoxic respiratory failure requiring BiPAP as well as hour-long albuterol  treatment.  She was frequently reassessed.  She has improved with treatments.    Medications Ordered in the ED  insulin  aspart (novoLOG ) injection 0-20 Units (15 Units Subcutaneous Given 08/14/23 1645)  insulin  aspart (novoLOG ) injection 0-5 Units (has no administration in time range)  insulin  aspart (novoLOG ) injection 6 Units (6 Units Subcutaneous Given 08/14/23 1646)  enoxaparin  (LOVENOX ) injection 40 mg (40 mg Subcutaneous Given 08/14/23 1606)  acetaminophen  (TYLENOL ) tablet 650 mg (has no administration in time range)    Or  acetaminophen  (TYLENOL ) suppository 650 mg (has no administration in time range)  oxyCODONE  (Oxy IR/ROXICODONE ) immediate release tablet 5 mg (5 mg Oral Given 08/14/23 1605)  fentaNYL  (SUBLIMAZE ) injection 25 mcg (25 mcg Intravenous Given 08/14/23 1835)  methylPREDNISolone  sodium succinate (SOLU-MEDROL ) 125 mg/2 mL injection 60 mg (has no administration in time range)  budesonide  (PULMICORT ) nebulizer solution 0.25 mg (0.25 mg Nebulization Given 08/14/23 1925)  arformoterol  (BROVANA ) nebulizer solution 15 mcg (15 mcg Nebulization Given 08/14/23 1925)  revefenacin (YUPELRI) nebulizer solution 175 mcg (175 mcg Nebulization Given 08/14/23 1416)  ipratropium-albuterol   (DUONEB) 0.5-2.5 (3) MG/3ML nebulizer solution 3 mL (has no administration in time range)  levofloxacin  (LEVAQUIN ) IVPB 750 mg (0 mg Intravenous Stopped 08/14/23 1613)  Chlorhexidine  Gluconate Cloth 2 % PADS 6 each (has no administration in time range)  atenolol  (TENORMIN ) tablet 25 mg (has no administration in time range)  atorvastatin  (LIPITOR) tablet 20 mg (has no administration in time range)  buPROPion  (WELLBUTRIN  XL) 24 hr tablet 300 mg (300 mg Oral Given 08/14/23 1605)  busPIRone  (BUSPAR ) tablet 5 mg (has no administration in time range)  diazepam  (VALIUM ) tablet 5 mg (has no administration in time range)  fluticasone  (FLONASE ) 50 MCG/ACT nasal spray 2 spray (has no administration in time range)  guaiFENesin -dextromethorphan  (ROBITUSSIN DM) 100-10 MG/5ML syrup 10 mL (10 mLs Oral Given 08/14/23 1606)  levothyroxine  (SYNTHROID ) tablet 25 mcg (has no administration in time range)  montelukast  (SINGULAIR ) tablet 10 mg (has no administration in time range)  pantoprazole  (PROTONIX ) EC tablet 40 mg (has no administration in time range)  olopatadine  (PATANOL) 0.1 % ophthalmic solution 1 drop (has no administration in time range)  polyethylene glycol (MIRALAX  / GLYCOLAX ) packet 17 g (has no administration in time range)  pregabalin  (LYRICA ) capsule 300 mg (has no administration in time range)  QUEtiapine  (SEROQUEL ) tablet 100 mg (has no administration in time range)  roflumilast  (DALIRESP ) tablet 500 mcg (500 mcg Oral Given 08/14/23 1647)  traZODone  (DESYREL ) tablet 100 mg (has no administration in time range)  venlafaxine  XR (EFFEXOR -XR) 24 hr capsule 150 mg (has no administration in time range)  furosemide  (LASIX ) injection 20 mg (20 mg Intravenous Given 08/14/23 1606)  insulin  glargine-yfgn (SEMGLEE ) injection 18 Units (has no administration in time range)  magnesium  sulfate IVPB 2 g 50 mL (0 g Intravenous Stopped 08/14/23 1115)  albuterol  (PROVENTIL ) (2.5 MG/3ML) 0.083% nebulizer solution (10  mg/hr Nebulization Given 08/14/23 0950)  lactated ringers  bolus 1,000 mL (0 mLs Intravenous Stopped 08/14/23 1213)  ketorolac  (TORADOL ) 15 MG/ML injection 15 mg (15 mg Intravenous Given 08/14/23 1243)    Clinical Course as of 08/14/23 1934  Thu Aug 14, 2023  0945 Patient presents the ER, having shortness of breath, increased work of breathing  exam and found to be satting mid 80s to low 90s on 6 L nasal cannula.  She has already had steroids and multiple DuoNeb treatments, started continuous albuterol , magnesium  and BiPAP was applied.  On repeat evaluation after BiPAP, patient is saturating 98%, work of breathing is improved. [CB]  1244 Evaluation patient has feeling better, she was sleeping comfortably, woke up to voice.  She satting 98% on BiPAP.  Blood pressure 143/75 on the monitor.  She is no longer tachycardic.  She still complaining of some left lateral rib pain.  Discussed with patient EKG reassuring and troponin negative.  I also ordered D-dimer to aid in rule out of PE.  This is 0.7.  I applied years criteria, patient does not have any signs or symptoms of DVT, no hemoptysis and feel her symptoms are more acute due to her COPD exacerbation and do not feel she needs emergent CTA.  She was having similar pain prior admission and had a negative CTA at that time. [CB]    Clinical Course User Index [CB] Aimee Houseman, PA-C                                 Medical Decision Making This patient presents to the ED for concern of SOB, this involves an extensive number of treatment options, and is a complaint that carries with it a high risk of complications and morbidity.  The differential diagnosis includes COPD exacerbation, CHF, pneumonia, ACS, PE, pneumothorax, anemia, other   Co morbidities that complicate the patient evaluation :   COPD, chronic, chronic hypoxic respiratory failure, CAD   Additional history obtained:  Additional history obtained from EMR External records from outside  source obtained and reviewed including prior notes, labs, imaging- hospital discharge Dec 2024, CTA Dec 2024 with no PE   Lab Tests:  I Ordered, and personally interpreted labs.  The pertinent results include: CBC-blood cell count 14.1, no anemia Troponin negative x 2 BNP 237 BMP-glucose 207 with normal kidney function and electrolytes VBG shows normal pH  D-dimer-0.7   Imaging Studies ordered:  I ordered imaging studies including chest x-ray which shows chronic changes, no acute cardiopulmonary findings I independently visualized and interpreted imaging within scope of identifying emergent findings  I agree with the radiologist interpretation   Cardiac Monitoring: / EKG:  The patient was maintained on a cardiac monitor.  I personally viewed and interpreted the cardiac monitored which showed an underlying rhythm of: Sinus tachycardia   Consultations Obtained:  I requested consultation with the hospitalist, Dr. Lincoln Renshaw,  and discussed lab and imaging findings as well as pertinent plan - they recommend: Admission for COPD exacerbation   Problem List / ED Course / Critical interventions / Medication management  Patient here with COPD exacerbation, requiring BiPAP and nebulizers.  She improved significantly with this.  She was having some left lateral chest pain.  She had 2 negative tropes, reassuring chest x-ray and D-dimer was 0.7 and she is negative on years criteria so no further workup indicated.  Discussed with hospitalist for admission for COPD exacerbation with acute on chronic hypoxic respiratory failure.  Patient got steroids en rounte,  no further steroids given in the ED I ordered medication including magnesium , albuterol  for COPD exacerbation Reevaluation of the patient after these medicines showed that the patient improved I have reviewed the patients home medicines and have made adjustments as needed   Social Determinants of Health:  Patient on home O2, lives at  home, on hospice      Amount and/or Complexity of Data Reviewed Labs: ordered. Radiology: ordered.  Risk Prescription drug management. Decision regarding hospitalization.        Final diagnoses:  COPD exacerbation (HCC)  Acute on chronic hypoxic respiratory failure [facility])    ED Discharge Orders     None          Joshua Nieves 08/14/23 1934    Karlyn Overman, MD 08/14/23 2114

## 2023-08-14 NOTE — Progress Notes (Signed)
 Patient transferred from the ED to ICU 10 without complications. Pt tolerated well.

## 2023-08-14 NOTE — H&P (Addendum)
 History and Physical  [facility]  Mooar NWG:[id number] DOB: 09-11-1960 DOA: 08/14/2023  PCP: Adelia Homestead, MD  Patient coming from: home by [facility]  Level of care: Stepdown  I have personally briefly reviewed patient's old medical records in [facility]  Chief Complaint: SOB   HPI: Joan [name] is a 63 year old female on home hospice care for end-stage terminal COPD lung disease, chronic interstitial lung disease, chronic hypoxic respiratory failure oxygen  dependent on 4-5 L/min supplemental oxygen , coronary artery disease, frequent hospitalizations for COPD exacerbations, history of longtime tobacco use, history of urinary retention, history of pleuritic chest pain, hypertension, generalized anxiety disorder, history of bipolar disorder, mood disorder, hypothyroidism, chronic constipation, chronic rhinitis, type 2 diabetes mellitus with neuropathy, GERD, hyperlipidemia, obesity, OSA on CPAP, OA, polypharmacy, PTSD presented to the emergency department by EMS complaining of shortness of breath.  Patient reports she has shortness of breath that woke her up this morning.  She self-administered 2 albuterol  nebulizer treatments at home and got on her's home CPAP and reportedly could not improve her oxygen  saturation above 82%.  She said she stopped smoking but is vaping.  She is also complaining of pain in her left upper quadrant of her abdomen and side.  She called EMS and they administered Solu-Medrol , bronchodilators and after arriving in the ED she was placed on BiPAP therapy before she started to have some improvement.  When she arrived she was tachycardic, tachypneic with diffuse wheezing and obvious respiratory distress.  Fortunately after BiPAP therapy started she improved.  Patient is adamant she wants to return home to home hospice care tomorrow.  She is being admitted for acute on chronic respiratory failure with a COPD exacerbation requiring BiPAP therapy to  the stepdown ICU with plans to continue aggressive treatments and if continues to improve likely could resume home hospice therapies tomorrow.     Past Medical History:  Diagnosis Date   Acute respiratory failure with hypoxia (HCC)    Alcoholism and drug addiction in family    Anxiety    Appendicitis    Arthritis    knees, hips, lower back (09/25/2016)   Asthma    Bipolar disorder (HCC)    Chronic bronchitis (HCC)    all the time (09/25/2016)   Chronic leg pain    RLE   Chronic lower back pain    COPD (chronic obstructive pulmonary disease) (HCC)    Depression    Diabetes mellitus without complication (HCC)    Diabetic neuropathy (HCC)    Early cataracts, bilateral    Emphysema of lung (HCC)    Generalized anxiety disorder 02/09/2020   GERD (gastroesophageal reflux disease)    Headache    Hepatitis C    treated back in 2001-2002   High cholesterol    Hypertension    Hypothyroidism    Incarcerated ventral hernia 04/04/2017   Mood disorder in conditions classified elsewhere 02/09/2020   On home oxygen  therapy    2L; at nighttime (04/04/2017)   OSA on CPAP    CPAP with Oxygen  3 liters   Paranoid (HCC)    Pneumonia    all the time (09/25/2016)   Positive PPD    with negative chest x ray   PTSD (post-traumatic stress disorder) 02/09/2020   Tachycardia    patient reports with exertion ; denies any other conditions    Thyroid  disease    Wears glasses    Wears partial dentures     Past Surgical  History:  Procedure Laterality Date   ANTERIOR CERVICAL DECOMP/DISCECTOMY FUSION N/A 02/14/2022   Procedure: Anterior Cervical Decompression/Discectomy Fusion Cervical Three-Cervical Four;  Surgeon: Van Gelinas, MD;  Location: [facility] OR;  Service: Neurosurgery;  Laterality: N/A;  3C   APPENDECTOMY     BACK SURGERY     BIOPSY  11/24/2018   Procedure: BIOPSY;  Surgeon: Lindle Rhea, MD;  Location: [facility];  Service: Gastroenterology;;   CARDIAC CATHETERIZATION      COLONOSCOPY     COLONOSCOPY WITH PROPOFOL  N/A 11/24/2018   Procedure: COLONOSCOPY WITH PROPOFOL ;  Surgeon: Lindle Rhea, MD;  Location: [facility];  Service: Gastroenterology;  Laterality: N/A;   CYST EXCISION     removal of cyst in sinuses   DILATION AND CURETTAGE OF UTERUS     ESOPHAGOGASTRODUODENOSCOPY (EGD) WITH PROPOFOL  N/A 11/24/2018   Procedure: ESOPHAGOGASTRODUODENOSCOPY (EGD) WITH PROPOFOL ;  Surgeon: Lindle Rhea, MD;  Location: [facility];  Service: Gastroenterology;  Laterality: N/A;   FOOT SURGERY Bilateral    bone removed from 4th and 5 th toes and put in pin then took pin out   HERNIA REPAIR     INCISION AND DRAINAGE  ~ 2014/2015   removed mesh & infection   INCISIONAL HERNIA REPAIR N/A 04/04/2017   Procedure: LAPAROSCOPIC INCISIONAL HERNIA REPAIR WITH MESH;  Surgeon: Boyce Byes, MD;  Location: [facility] OR;  Service: General;  Laterality: N/A;   LACERATION REPAIR Right    repair wrist artery from laceration from ice skate   LAPAROSCOPIC APPENDECTOMY N/A 03/31/2019   Procedure: APPENDECTOMY LAPAROSCOPIC;  Surgeon: Shela Derby, MD;  Location: [facility] ORS;  Service: General;  Laterality: N/A;   LAPAROSCOPIC APPENDECTOMY N/A 07/09/2019   Procedure: APPENDECTOMY LAPAROSCOPIC;  Surgeon: Shela Derby, MD;  Location: [facility] OR;  Service: General;  Laterality: N/A;   LAPAROSCOPIC INCISIONAL / UMBILICAL / VENTRAL HERNIA REPAIR  04/04/2017   LAPAROSCOPIC INCISIONAL HERNIA REPAIR WITH MESH   LIPOMA EXCISION Right    fattty lipoma in neck   NASAL SEPTUM SURGERY     POSTERIOR LUMBAR FUSION  2015/2016 X 2   added rods and screws   SINOSCOPY     TOTAL HIP ARTHROPLASTY Right 02/05/2017   Procedure: RIGHT TOTAL HIP ARTHROPLASTY;  Surgeon: Hazle Lites, MD;  Location: [facility];  Service: Orthopedics;  Laterality: Right;   TOTAL HIP ARTHROPLASTY Left 06/18/2017   Procedure: LEFT TOTAL HIP ARTHROPLASTY;  Surgeon: Hazle Lites, MD;  Location: [facility];  Service:  Orthopedics;  Laterality: Left;   TOTAL HIP REVISION Left 01/26/2019   Procedure: TOTAL HIP REVISION;  Surgeon: Claiborne Crew, MD;  Location: [facility];  Service: Orthopedics;  Laterality: Left;  2 hrs   TUBAL LIGATION     UMBILICAL HERNIA REPAIR  ~ 2013   w/mesh   UPPER GI ENDOSCOPY       reports that she has quit smoking. Her smoking use included cigarettes. She started smoking about 48 years ago. She has a 96.9 pack-year smoking history. She has been exposed to tobacco smoke. She has never used smokeless tobacco. She reports that she does not currently use alcohol. She reports that she does not currently use drugs after having used the following drugs: Crack cocaine.  Allergies  Allergen Reactions   Keflex [Cephalexin] Rash and Other (See Comments)    Has tolerated amoxicillin  since had keflex   Shellfish Allergy Shortness Of Breath, Nausea And Vomiting and Other (See Comments)    Stomach cramps   Tetracyclines & Related Anaphylaxis, Swelling and Other (  See Comments)    Throat swelling requiring hospitalization   Dilaudid  [Hydromorphone  Hcl] Other (See Comments)    Lethargy   Prednisone  Other (See Comments)    counter reacts, has tolerated IV medrol    Clindamycin /Lincomycin Other (See Comments)    Burning sensation in her mouth down her throat    Incruse Ellipta  [Umeclidinium Bromide ] Nausea Only   Molds & Smuts Itching    Allergic to everything, pt said she is allergic to most environmental allergens   Tuberculin Tests Other (See Comments)    False postive   Umeclidinium     Other Reaction(s): GI Intolerance    Family History  Problem Relation Age of Onset   Diabetes Mother    Hypertension Mother    Hypertension Father    Cancer Father        lung   Breast cancer Neg Hx     Prior to Admission medications   Medication Sig Start Date End Date Taking? Authorizing Provider  albuterol  (PROVENTIL ) (2.5 MG/3ML) 0.083% nebulizer solution Take 2.5 mg by nebulization 2  (two) times daily. 02/28/23  Yes [provider]  atenolol  (TENORMIN ) 25 MG tablet TAKE 1 TABLET BY MOUTH DAILY 05/15/23  Yes Adelia Homestead, MD  atorvastatin  (LIPITOR) 20 MG tablet TAKE 1 TABLET BY MOUTH ONCE  DAILY 03/10/23  Yes Adelia Homestead, MD  Budeson-Glycopyrrol-Formoterol  (BREZTRI  AEROSPHERE) 160-9-4.8 MCG/ACT AERO Inhale 2 puffs into the lungs 2 (two) times daily. 03/11/23  Yes Lind Repine, MD  buPROPion  (WELLBUTRIN  XL) 300 MG 24 hr tablet TAKE 1 TABLET BY MOUTH DAILY 06/11/23  Yes Wray Heady, MD  busPIRone  (BUSPAR ) 5 MG tablet Take 5 mg by mouth 2 (two) times daily. 08/01/23  Yes [provider]  diazepam  (VALIUM ) 5 MG tablet Take 1 tablet (5 mg total) by mouth every 6 (six) hours as needed for anxiety. 12/07/22  Yes Anallely Rosell L, MD  fluticasone  (FLONASE ) 50 MCG/ACT nasal spray Place 2 sprays into both nostrils daily as needed for allergies. 12/07/22 08/14/23 Yes Zaydah Nawabi L, MD  furosemide  (LASIX ) 20 MG tablet Take 20 mg by mouth daily as needed. 09/16/22  Yes [provider]  guaiFENesin -dextromethorphan  (ROBITUSSIN DM) 100-10 MG/5ML syrup Take 10 mLs by mouth every 8 (eight) hours. 02/02/23  Yes Shahmehdi, Seyed A, MD  ipratropium-albuterol  (DUONEB) 0.5-2.5 (3) MG/3ML SOLN Take 3 mLs by nebulization every 4 (four) hours as needed (shortness of breath). 05/20/23  Yes [provider]  levothyroxine  (SYNTHROID ) 25 MCG tablet TAKE 1 TABLET BY MOUTH DAILY  BEFORE BREAKFAST 05/05/23  Yes Adelia Homestead, MD  MELATONIN PO Take 10 mg by mouth at bedtime as needed (sleep).   Yes [provider]  metFORMIN  (GLUCOPHAGE ) 500 MG tablet Take 500 mg by mouth daily. 07/26/23  Yes [provider]  montelukast  (SINGULAIR ) 10 MG tablet TAKE 1 TABLET BY MOUTH AT  BEDTIME 03/08/23  Yes Lind Repine, MD  naproxen  (NAPROSYN ) 500 MG tablet Take 500 mg by mouth daily as needed for moderate pain (pain score 4-6). 03/12/23  Yes  [provider]  oxyCODONE -acetaminophen  (PERCOCET/ROXICET) 5-325 MG tablet Take 1-2 tablets by mouth every 6 (six) hours as needed for severe pain (pain score 7-10).   Yes [provider]  OXYGEN  Inhale 3.5 L/min into the lungs continuous. Inhale  3 L/min of oxygen  into the lungs w/CPAP at bedtime and as needed for shortness of breath with strenuous activity during the day   Yes [provider]  pantoprazole  (PROTONIX ) 40 MG tablet TAKE 1 TABLET BY MOUTH DAILY 08/13/23  Yes Adelia Homestead, MD  PATADAY  0.2 % SOLN Apply 1 drop to eye daily. 05/16/23  Yes [provider]  polyethylene glycol (MIRALAX  / GLYCOLAX ) 17 g packet Take 17 g by mouth daily as needed for mild constipation or moderate constipation.   Yes [provider]  pregabalin  (LYRICA ) 300 MG capsule TAKE 1 CAPSULE BY MOUTH TWICE  DAILY Patient taking differently: Take 300 mg by mouth 2 (two) times daily. Take 2 tablets (600 mg) daily 06/02/23  Yes Adelia Homestead, MD  QUEtiapine  (SEROQUEL ) 100 MG tablet TAKE 1 TABLET BY MOUTH AT  BEDTIME 07/25/23  Yes Wray Heady, MD  roflumilast  (DALIRESP ) 500 MCG TABS tablet Take 1 tablet (500 mcg total) by mouth daily. 04/02/22  Yes Raejean Bullock, NP  traZODone  (DESYREL ) 100 MG tablet Take 100 mg by mouth at bedtime.   Yes [provider]  venlafaxine  XR (EFFEXOR -XR) 150 MG 24 hr capsule TAKE 1 CAPSULE BY MOUTH DAILY  WITH BREAKFAST 08/13/23  Yes Adelia Homestead, MD  ACCU-CHEK GUIDE TEST test strip USE TO CHECK BLOOD SUGAR IN THE  MORNING AT [facility] AND AT BEDTIME 08/04/23   Adelia Homestead, MD  metformin  (FORTAMET ) 1000 MG (OSM) 24 hr tablet Take 1 tablet (1,000 mg total) by mouth daily with breakfast. 02/02/23 04/03/23  Bobbetta Burnet, MD  Wound Dressings (DRS CHOICE DIABETIC BANDAGES) KIT Apply 8 kits topically 3 (three) times daily before meals. 02/02/23   Bobbetta Burnet, MD    Physical Exam: Vitals:   08/14/23 1230  08/14/23 1245 08/14/23 1300 08/14/23 1315  BP: (!) 142/74 (!) 150/72 (!) 145/79 (!) 144/83  Pulse: 92 91 97 91  Resp:      Temp:      TempSrc:      SpO2: 97% 97% (!) 85% 97%  Weight:      Height:       Constitutional: pt appears acutely and chronically ill; on bipap; Eyes: PERRL, lids and conjunctivae normal ENMT: Mucous membranes are moist. Posterior pharynx clear of any exudate or lesions.Normal dentition.  Neck: normal, supple, no masses, no thyromegaly Respiratory: diffuse wheezing and rales heard bilaterally.  Moderately severe increased work of breathing.  Cardiovascular: normal s1, s2 sounds, no murmurs / rubs / gallops. No extremity edema. 2+ pedal pulses. No carotid bruits.  Abdomen: no tenderness, no masses palpated. No hepatosplenomegaly. Bowel sounds positive.  Musculoskeletal: no clubbing / cyanosis. No joint deformity upper and lower extremities. Good ROM, no contractures. Normal muscle tone.  Skin: no rashes, lesions, ulcers. No induration Neurologic: CN 2-12 grossly intact. Sensation intact, DTR normal. Strength 5/5 in all 4.  Psychiatric: Normal judgment and insight. Alert and oriented x 3. Normal mood.   Labs on Admission: I have personally reviewed following labs and imaging studies  CBC: Recent Labs  Lab 08/14/23 1007  WBC 14.1*  NEUTROABS 12.4*  HGB 12.8  HCT 40.7  MCV 89.8  PLT 237   Basic Metabolic Panel: Recent Labs  Lab 08/14/23 1007  NA 141  K 3.7  CL 102  CO2 29  GLUCOSE 207*  BUN 10  CREATININE 0.90  CALCIUM  9.1   GFR: Estimated Creatinine Clearance: 78.2 mL/min (by C-G formula based on SCr of 0.9 mg/dL). Liver Function Tests: No results for input(s): AST, ALT, ALKPHOS, BILITOT, PROT, ALBUMIN in the last 168 hours. No results for input(s): LIPASE, AMYLASE in the last 168  hours. No results for input(s): AMMONIA in the last 168 hours. Coagulation Profile: No results for input(s): INR, PROTIME in the last 168  hours. Cardiac Enzymes: No results for input(s): CKTOTAL, CKMB, CKMBINDEX, TROPONINI in the last 168 hours. BNP (last 3 results) No results for input(s): PROBNP in the last 8760 hours. HbA1C: No results for input(s): HGBA1C in the last 72 hours. CBG: No results for input(s): GLUCAP in the last 168 hours. Lipid Profile: No results for input(s): CHOL, HDL, LDLCALC, TRIG, CHOLHDL, LDLDIRECT in the last 72 hours. Thyroid  Function Tests: No results for input(s): TSH, T4TOTAL, FREET4, T3FREE, THYROIDAB in the last 72 hours. Anemia Panel: No results for input(s): VITAMINB12, FOLATE, FERRITIN, TIBC, IRON, RETICCTPCT in the last 72 hours. Urine analysis:    Component Value Date/Time   COLORURINE YELLOW 01/29/2023 1810   APPEARANCEUR CLEAR 01/29/2023 1810   LABSPEC >1.046 (H) 01/29/2023 1810   PHURINE 7.0 01/29/2023 1810   GLUCOSEU NEGATIVE 01/29/2023 1810   GLUCOSEU NEGATIVE 08/05/2017 1652   HGBUR SMALL (A) 01/29/2023 1810   BILIRUBINUR NEGATIVE 01/29/2023 1810   KETONESUR NEGATIVE 01/29/2023 1810   PROTEINUR 30 (A) 01/29/2023 1810   UROBILINOGEN 0.2 08/05/2017 1652   NITRITE NEGATIVE 01/29/2023 1810   LEUKOCYTESUR NEGATIVE 01/29/2023 1810    Radiological Exams on Admission: DG Chest Port 1 View Result Date: 08/14/2023 CLINICAL DATA:  Shortness of breath EXAM: PORTABLE CHEST 1 VIEW COMPARISON:  January 29, 2023 FINDINGS: Prominence of the interstitial markings bilaterally in particularly basilar interstitial prominence correlate with chronic interstitial lung disease. Which correlates with the prior CT chest January 29, 2023. No acute cardiopulmonary infiltrates. Heart and mediastinum normal. IMPRESSION: Chronic interstitial lung disease. No acute cardiopulmonary infiltrates. Electronically Signed   By: Fredrich Jefferson M.D.   On: 08/14/2023 10:29   Assessment/Plan Principal Problem:   Acute and chronic respiratory failure with hypoxia  (HCC) Active Problems:   Enrolled in home hospice care   Chronic interstitial lung disease (HCC)   Pleuritic chest pain   Tobacco use disorder   Essential hypertension   Chronic respiratory failure with hypoxia (HCC)   Chronic constipation   Osteoarthritis   Mixed hyperlipidemia   Hypothyroidism due to acquired atrophy of thyroid    GERD (gastroesophageal reflux disease)   OSA on CPAP   Carotid artery disease (HCC)   Smokers' cough (HCC)   Chronic rhinitis   Coarse tremors   PTSD (post-traumatic stress disorder)   Generalized anxiety disorder   Acquired hypothyroidism   Controlled type 2 diabetes mellitus with neuropathy (HCC)   COPD (chronic obstructive pulmonary disease) with emphysema (HCC)   Neuropathy due to type 2 diabetes mellitus (HCC)   Chronic obstructive pulmonary disease (HCC)   PAD (peripheral artery disease) (HCC)   Obesity (BMI 30-39.9)   Polypharmacy   Bipolar disorder   Acute on chronic respiratory failure with hypoxia -Secondary to acute COPD exacerbation -Continue BiPAP therapy -Continue IV steroids bronchodilators and pulmonary toilet -Continue mucolytics and antibiotics -Patient will be admitted into the stepdown ICU -Continue supportive therapies as ordered -Patient has improved considerably on BiPAP hopefully she can be stabilized and plan is to return to home hospice tomorrow if improved to resume home hospice care therapies  Chronic interstitial lung disease End-stage terminal COPD -I spoke with patient and she would like to continue home hospice care -Patient would like to return to home hospice as soon as she is stabilized -Will check with home hospice to see if she could qualify for home NIV/BiPAP therapy  Pain in the left upper quadrant and side -Patient continues to complain of this pain and discomfort -Her Wells score Wells score and revised Geneva score is low probability for PE -CT abdomen/pelvis ordered  Type 2 diabetes mellitus with  neuropathy -Continue SSI coverage and CBG monitoring as ordered -Anticipating some steroid-induced hyperglycemia  PTSD/GAD/bipolar -Resume home medications and follow -Delirium precautions  OSA on CPAP -Currently on BiPAP therapy, resume CPAP at home  DVT prophylaxis: Enoxaparin  Code Status: Full Family Communication:   Disposition Plan: Resume home hospice in 1 to 2 days Consults called:   Admission status: Observation  Critical Care Procedure Note Authorized and Performed by: Olga Berthold MD  Total Critical Care time:  62 mins Due to a high probability of clinically significant, life threatening deterioration, the patient required my highest level of preparedness to intervene emergently and I personally spent this critical care time directly and personally managing the patient.  This critical care time included obtaining a history; examining the patient, pulse oximetry; ordering and review of studies; arranging urgent treatment with development of a management plan; evaluation of patient's response of treatment; frequent reassessment; and discussions with other providers.  This critical care time was performed to assess and manage the high probability of imminent and life threatening deterioration that could result in multi-organ failure.  It was exclusive of separately billable procedures and treating other patients and teaching time.   Level of care: Stepdown Faustino Hook MD [facility] How to contact the [facility] Attending or Consulting provider 7A - 7P or covering provider during after hours 7P -7A, for this patient?  Check the care team in [facility] and look for a) attending/consulting [facility] provider listed and b) the [facility] team listed Log into [email or web address] and use [facility] universal password to access. If you do not have the password, please contact the hospital operator. Locate the [facility] provider you are looking for under [facility] and page to a number that you can be  directly reached. If you still have difficulty reaching the provider, please page the [facility] (Director on Call) for the Hospitalists listed on amion for assistance.   If 7PM-7AM, please contact night-coverage [email or web address] Password [email or web address]  08/14/2023, 2:05 PM

## 2023-08-15 ENCOUNTER — Observation Stay (HOSPITAL_COMMUNITY)

## 2023-08-15 DIAGNOSIS — J849 Interstitial pulmonary disease, unspecified: Secondary | ICD-10-CM

## 2023-08-15 DIAGNOSIS — J9621 Acute and chronic respiratory failure with hypoxia: Principal | ICD-10-CM

## 2023-08-15 DIAGNOSIS — J90 Pleural effusion, not elsewhere classified: Secondary | ICD-10-CM | POA: Diagnosis not present

## 2023-08-15 DIAGNOSIS — J449 Chronic obstructive pulmonary disease, unspecified: Secondary | ICD-10-CM | POA: Diagnosis not present

## 2023-08-15 DIAGNOSIS — J431 Panlobular emphysema: Secondary | ICD-10-CM | POA: Diagnosis not present

## 2023-08-15 DIAGNOSIS — R918 Other nonspecific abnormal finding of lung field: Secondary | ICD-10-CM | POA: Diagnosis not present

## 2023-08-15 DIAGNOSIS — E1140 Type 2 diabetes mellitus with diabetic neuropathy, unspecified: Secondary | ICD-10-CM

## 2023-08-15 LAB — CBC WITH DIFFERENTIAL/PLATELET
Abs Immature Granulocytes: 0.12 10*3/uL — ABNORMAL HIGH (ref 0.00–0.07)
Basophils Absolute: 0 10*3/uL (ref 0.0–0.1)
Basophils Relative: 0 %
Eosinophils Absolute: 0.1 10*3/uL (ref 0.0–0.5)
Eosinophils Relative: 1 %
HCT: 34.7 % — ABNORMAL LOW (ref 36.0–46.0)
Hemoglobin: 10.9 g/dL — ABNORMAL LOW (ref 12.0–15.0)
Immature Granulocytes: 1 %
Lymphocytes Relative: 4 %
Lymphs Abs: 0.7 10*3/uL (ref 0.7–4.0)
MCH: 28.2 pg (ref 26.0–34.0)
MCHC: 31.4 g/dL (ref 30.0–36.0)
MCV: 89.7 fL (ref 80.0–100.0)
Monocytes Absolute: 0.6 10*3/uL (ref 0.1–1.0)
Monocytes Relative: 3 %
Neutro Abs: 16.8 10*3/uL — ABNORMAL HIGH (ref 1.7–7.7)
Neutrophils Relative %: 91 %
Platelets: 196 10*3/uL (ref 150–400)
RBC: 3.87 MIL/uL (ref 3.87–5.11)
RDW: 15.9 % — ABNORMAL HIGH (ref 11.5–15.5)
WBC: 18.3 10*3/uL — ABNORMAL HIGH (ref 4.0–10.5)
nRBC: 0 % (ref 0.0–0.2)

## 2023-08-15 LAB — COMPREHENSIVE METABOLIC PANEL WITH GFR
ALT: 16 U/L (ref 0–44)
AST: 15 U/L (ref 15–41)
Albumin: 3.3 g/dL — ABNORMAL LOW (ref 3.5–5.0)
Alkaline Phosphatase: 72 U/L (ref 38–126)
Anion gap: 10 (ref 5–15)
BUN: 17 mg/dL (ref 8–23)
CO2: 29 mmol/L (ref 22–32)
Calcium: 8.7 mg/dL — ABNORMAL LOW (ref 8.9–10.3)
Chloride: 99 mmol/L (ref 98–111)
Creatinine, Ser: 0.87 mg/dL (ref 0.44–1.00)
GFR, Estimated: >60 mL/min (ref >60)
Glucose, Bld: 254 mg/dL — ABNORMAL HIGH (ref 70–99)
Potassium: 4.2 mmol/L (ref 3.5–5.1)
Sodium: 138 mmol/L (ref 135–145)
Total Bilirubin: 0.4 mg/dL (ref 0.0–1.2)
Total Protein: 6.3 g/dL — ABNORMAL LOW (ref 6.5–8.1)

## 2023-08-15 LAB — GLUCOSE, CAPILLARY
Glucose-Capillary: 282 mg/dL — ABNORMAL HIGH (ref 70–99)
Glucose-Capillary: 189 mg/dL — ABNORMAL HIGH (ref 70–99)
Glucose-Capillary: 210 mg/dL — ABNORMAL HIGH (ref 70–99)
Glucose-Capillary: 201 mg/dL — ABNORMAL HIGH (ref 70–99)

## 2023-08-15 LAB — MAGNESIUM: Magnesium: 2.2 mg/dL (ref 1.7–2.4)

## 2023-08-15 MED ORDER — TAMSULOSIN HCL 0.4 MG PO CAPS: 0.4000 mg | ORAL_CAPSULE | Freq: Every day | ORAL | 1 refills | Status: AC

## 2023-08-15 MED ORDER — DEXAMETHASONE 4 MG PO TABS: 4.0000 mg | ORAL_TABLET | Freq: Every day | ORAL | 0 refills | Status: DC

## 2023-08-15 MED ORDER — DEXAMETHASONE 4 MG PO TABS: 4.0000 mg | ORAL_TABLET | Freq: Every day | ORAL | 0 refills | Status: AC

## 2023-08-15 MED ORDER — INSULIN GLARGINE-YFGN 100 UNIT/ML ~~LOC~~ SOLN
25.0000 [IU] | Freq: Every day | SUBCUTANEOUS | Status: DC
  Filled 2023-08-15: qty 0.25

## 2023-08-15 MED ORDER — TAMSULOSIN HCL 0.4 MG PO CAPS
0.4000 mg | ORAL_CAPSULE | Freq: Every day | ORAL | 1 refills | Status: DC
Start: 2023-08-15 — End: 2023-08-15

## 2023-08-15 MED ORDER — FUROSEMIDE 20 MG PO TABS: 20.0000 mg | ORAL_TABLET | Freq: Every day | ORAL | Status: DC

## 2023-08-15 MED ORDER — LEVOFLOXACIN 750 MG PO TABS: 750.0000 mg | ORAL_TABLET | Freq: Every day | ORAL | 0 refills | Status: DC

## 2023-08-15 MED ORDER — TAMSULOSIN HCL 0.4 MG PO CAPS
0.4000 mg | ORAL_CAPSULE | Freq: Every day | ORAL | Status: DC
  Administered 2023-08-15: 0.4 mg via ORAL
  Filled 2023-08-15: qty 1

## 2023-08-15 MED ORDER — PREGABALIN 300 MG PO CAPS: 300.0000 mg | ORAL_CAPSULE | Freq: Two times a day (BID) | ORAL | Status: AC

## 2023-08-15 MED ORDER — LEVOFLOXACIN 750 MG PO TABS: 750.0000 mg | ORAL_TABLET | Freq: Every day | ORAL | 0 refills | Status: AC

## 2023-08-15 MED ORDER — INSULIN ASPART 100 UNIT/ML IJ SOLN
10.0000 [IU] | Freq: Three times a day (TID) | INTRAMUSCULAR | Status: DC
  Administered 2023-08-15 (×2): 10 [IU] via SUBCUTANEOUS

## 2023-08-15 NOTE — Discharge Summary (Addendum)
 Physician Discharge Summary  Joan [name] NWG:[id number] DOB: [birth year] DOA: 08/14/2023  PCP: Adelia Homestead, MD  Admit date: 08/14/2023 Discharge date: 08/15/2023  Admitted From:  Home with Hospice  Disposition:  Home with Hospice   Recommendations for Outpatient Follow-up:  Follow up with PCP in 1 weeks Symptom management per hospice protocol   Discharge Condition: STABLE   CODE STATUS: FULL DIET:  comfort as tolerated    Brief Hospitalization Summary: Please see all hospital notes, images, labs for full details of the hospitalization. 63 year old female on home hospice care for end-stage terminal COPD lung disease, chronic interstitial lung disease, chronic hypoxic respiratory failure oxygen  dependent on 4-5 L/min supplemental oxygen , coronary artery disease, frequent hospitalizations for COPD exacerbations, history of longtime tobacco use, history of urinary retention, history of pleuritic chest pain, hypertension, generalized anxiety disorder, history of bipolar disorder, mood disorder, hypothyroidism, chronic constipation, chronic rhinitis, type 2 diabetes mellitus with neuropathy, GERD, hyperlipidemia, obesity, OSA on CPAP, OA, polypharmacy, PTSD presented to the emergency department by EMS complaining of shortness of breath.  Patient reports she has shortness of breath that woke her up this morning.  She self-administered 2 albuterol  nebulizer treatments at home and got on her's home CPAP and reportedly could not improve her oxygen  saturation above 82%.  She said she stopped smoking but is vaping.  She is also complaining of pain in her left upper quadrant of her abdomen and side.  She called EMS and they administered Solu-Medrol , bronchodilators and after arriving in the ED she was placed on BiPAP therapy before she started to have some improvement.  When she arrived she was tachycardic, tachypneic with diffuse wheezing and obvious respiratory distress.  Fortunately after BiPAP  therapy started she improved.  Patient is adamant she wants to return home to home hospice care tomorrow.  She is being admitted for acute on chronic respiratory failure with a COPD exacerbation requiring BiPAP therapy to the stepdown ICU with plans to continue aggressive treatments and if continues to improve likely could resume home hospice therapies tomorrow.   Hospital Course by listed problems addressed  Acute on chronic respiratory failure with hypoxia - Improved back to baseline -Secondary to acute COPD exacerbation -Pt responded very well to BiPAP therapy and now back on baseline oxygen  requirements -Pt was treated with IV steroids bronchodilators and pulmonary toilet -Continue mucolytics and antibiotics -Patient was admitted into the stepdown ICU due to bipap requirements -Patient has improved considerably on BiPAP and now stabilized to baseline and plan is to return to home hospice today to resume home hospice care therapies   Chronic interstitial lung disease End-stage terminal COPD -I spoke with patient and she would like to continue home hospice care -Patient would like to return to home hospice    Pain in the left upper quadrant and side -Patient continues to complain of this pain and discomfort -Her Wells score Wells score and revised Geneva score is low probability for PE -suspect symptoms related to acute urinary retention that has now been treated and addressed  Acute urinary retention --I/O cath done x 2, pt was able to void on her own this morning and preferred not to get a foley cath  -- we have started her on flomax  0.4 mg daily and given her precautions    Type 2 diabetes mellitus with neuropathy -pt was treated with semglee , prandial, SSI coverage and CBG monitoring as ordered -pt had steroid-induced hyperglycemia --resume home treatments and we have stopped IV steroid --pt  advised to monitor CBG closely over next 5 days as she completes oral steroid  course  PTSD/GAD/bipolar -Resume home medications -Delirium precautions   OSA on CPAP - resume CPAP at home   Discharge Diagnoses:  Principal Problem:   Acute and chronic respiratory failure with hypoxia (HCC) Active Problems:   Enrolled in home hospice care   Chronic interstitial lung disease (HCC)   Pleuritic chest pain   Tobacco use disorder   Essential hypertension   Chronic respiratory failure with hypoxia (HCC)   Chronic constipation   Osteoarthritis   Mixed hyperlipidemia   Hypothyroidism due to acquired atrophy of thyroid    GERD (gastroesophageal reflux disease)   OSA on CPAP   Carotid artery disease (HCC)   Smokers' cough (HCC)   Chronic rhinitis   Coarse tremors   PTSD (post-traumatic stress disorder)   Generalized anxiety disorder   Acquired hypothyroidism   Controlled type 2 diabetes mellitus with neuropathy (HCC)   COPD (chronic obstructive pulmonary disease) with emphysema (HCC)   Neuropathy due to type 2 diabetes mellitus (HCC)   Chronic obstructive pulmonary disease (HCC)   PAD (peripheral artery disease) (HCC)   Obesity (BMI 30-39.9)   Polypharmacy   Bipolar disorder   Discharge Instructions:  Allergies as of 08/15/2023       Reactions   Keflex [cephalexin] Rash, Other (See Comments)   Has tolerated amoxicillin  and ceftriaxone  since had keflex   Shellfish Allergy Shortness Of Breath, Nausea And Vomiting, Other (See Comments)   Stomach cramps   Tetracyclines & Related Anaphylaxis, Swelling, Other (See Comments)   Throat swelling requiring hospitalization   Dilaudid  [hydromorphone  Hcl] Other (See Comments)   Lethargy   Prednisone  Other (See Comments)   counter reacts, has tolerated IV medrol    Clindamycin /lincomycin Other (See Comments)   Burning sensation in her mouth down her throat    Incruse Ellipta  [umeclidinium Bromide ] Nausea Only   Molds & Smuts Itching   Allergic to everything, pt said she is allergic to most environmental  allergens   Tuberculin Tests Other (See Comments)   False postive   Umeclidinium    Other Reaction(s): GI Intolerance        Medication List     STOP taking these medications    naproxen  500 MG tablet Commonly known as: NAPROSYN        TAKE these medications    Accu-Chek Guide Test test strip Generic drug: glucose blood USE TO CHECK BLOOD SUGAR IN THE  MORNING AT NOON AND AT BEDTIME   albuterol  (2.5 MG/3ML) 0.083% nebulizer solution Commonly known as: PROVENTIL  Take 2.5 mg by nebulization 2 (two) times daily.   atenolol  25 MG tablet Commonly known as: TENORMIN  TAKE 1 TABLET BY MOUTH DAILY   atorvastatin  20 MG tablet Commonly known as: LIPITOR TAKE 1 TABLET BY MOUTH ONCE  DAILY   Breztri  Aerosphere 160-9-4.8 MCG/ACT Aero inhaler Generic drug: budesonide -glycopyrrolate-formoterol  Inhale 2 puffs into the lungs 2 (two) times daily.   buPROPion  300 MG 24 hr tablet Commonly known as: WELLBUTRIN  XL TAKE 1 TABLET BY MOUTH DAILY   busPIRone  5 MG tablet Commonly known as: BUSPAR  Take 5 mg by mouth 2 (two) times daily.   dexamethasone  4 MG tablet Commonly known as: DECADRON  Take 1 tablet (4 mg total) by mouth daily for 3 days. Start taking on: August 16, 2023   diazepam  5 MG tablet Commonly known as: VALIUM  Take 1 tablet (5 mg total) by mouth every 6 (six) hours as needed for anxiety.  Drs Choice Diabetic Bandages Kit Apply 8 kits topically 3 (three) times daily before meals.   fluticasone  50 MCG/ACT nasal spray Commonly known as: FLONASE  Place 2 sprays into both nostrils daily as needed for allergies.   furosemide  20 MG tablet Commonly known as: LASIX  Take 1 tablet (20 mg total) by mouth daily. What changed:  when to take this reasons to take this   guaiFENesin -dextromethorphan  100-10 MG/5ML syrup Commonly known as: ROBITUSSIN DM Take 10 mLs by mouth every 8 (eight) hours.   ipratropium-albuterol  0.5-2.5 (3) MG/3ML Soln Commonly known as: DUONEB Take  3 mLs by nebulization every 4 (four) hours as needed (shortness of breath).   levofloxacin  750 MG tablet Commonly known as: Levaquin  Take 1 tablet (750 mg total) by mouth daily for 2 days.   levothyroxine  25 MCG tablet Commonly known as: SYNTHROID  TAKE 1 TABLET BY MOUTH DAILY  BEFORE BREAKFAST   MELATONIN PO Take 10 mg by mouth at bedtime as needed (sleep).   metformin  1000 MG (OSM) 24 hr tablet Commonly known as: FORTAMET  Take 1 tablet (1,000 mg total) by mouth daily with breakfast. What changed: Another medication with the same name was removed. Continue taking this medication, and follow the directions you see here.   montelukast  10 MG tablet Commonly known as: SINGULAIR  TAKE 1 TABLET BY MOUTH AT  BEDTIME   oxyCODONE -acetaminophen  5-325 MG tablet Commonly known as: PERCOCET/ROXICET Take 1-2 tablets by mouth every 6 (six) hours as needed for severe pain (pain score 7-10).   OXYGEN  Inhale 3.5 L/min into the lungs continuous. Inhale  3 L/min of oxygen  into the lungs w/CPAP at bedtime and as needed for shortness of breath with strenuous activity during the day   pantoprazole  40 MG tablet Commonly known as: PROTONIX  TAKE 1 TABLET BY MOUTH DAILY   Pataday  0.2 % Soln Generic drug: Olopatadine  HCl Apply 1 drop to eye daily.   polyethylene glycol 17 g packet Commonly known as: MIRALAX  / GLYCOLAX  Take 17 g by mouth daily as needed for mild constipation or moderate constipation.   pregabalin  300 MG capsule Commonly known as: LYRICA  Take 1 capsule (300 mg total) by mouth 2 (two) times daily. Take 2 tablets (600 mg) daily   QUEtiapine  100 MG tablet Commonly known as: SEROQUEL  TAKE 1 TABLET BY MOUTH AT  BEDTIME   roflumilast  500 MCG Tabs tablet Commonly known as: DALIRESP  Take 1 tablet (500 mcg total) by mouth daily.   tamsulosin  0.4 MG Caps capsule Commonly known as: Flomax  Take 1 capsule (0.4 mg total) by mouth daily.   traZODone  100 MG tablet Commonly known as:  DESYREL  Take 100 mg by mouth at bedtime.   venlafaxine  XR 150 MG 24 hr capsule Commonly known as: EFFEXOR -XR TAKE 1 CAPSULE BY MOUTH DAILY  WITH BREAKFAST        Follow-up Information     Adelia Homestead, MD Follow up in 1 week(s).   Specialty: Internal Medicine Why: Hospital Follow Up Contact information: [address] [place] [address] [phone]                Allergies  Allergen Reactions   Keflex [Cephalexin] Rash and Other (See Comments)    Has tolerated amoxicillin  and ceftriaxone  since had keflex   Shellfish Allergy Shortness Of Breath, Nausea And Vomiting and Other (See Comments)    Stomach cramps   Tetracyclines & Related Anaphylaxis, Swelling and Other (See Comments)    Throat swelling requiring hospitalization   Dilaudid  [Hydromorphone  Hcl]  Other (See Comments)    Lethargy   Prednisone  Other (See Comments)    counter reacts, has tolerated IV medrol    Clindamycin /Lincomycin Other (See Comments)    Burning sensation in her mouth down her throat    Incruse Ellipta  [Umeclidinium Bromide ] Nausea Only   Molds & Smuts Itching    Allergic to everything, pt said she is allergic to most environmental allergens   Tuberculin Tests Other (See Comments)    False postive   Umeclidinium     Other Reaction(s): GI Intolerance   Allergies as of 08/15/2023       Reactions   Keflex [cephalexin] Rash, Other (See Comments)   Has tolerated amoxicillin  and ceftriaxone  since had keflex   Shellfish Allergy Shortness Of Breath, Nausea And Vomiting, Other (See Comments)   Stomach cramps   Tetracyclines & Related Anaphylaxis, Swelling, Other (See Comments)   Throat swelling requiring hospitalization   Dilaudid  [hydromorphone  Hcl] Other (See Comments)   Lethargy   Prednisone  Other (See Comments)   counter reacts, has tolerated IV medrol    Clindamycin /lincomycin Other (See Comments)   Burning sensation in her mouth down her throat    Incruse  Ellipta [umeclidinium Bromide ] Nausea Only   Molds & Smuts Itching   Allergic to everything, pt said she is allergic to most environmental allergens   Tuberculin Tests Other (See Comments)   False postive   Umeclidinium    Other Reaction(s): GI Intolerance        Medication List     STOP taking these medications    naproxen  500 MG tablet Commonly known as: NAPROSYN        TAKE these medications    Accu-Chek Guide Test test strip Generic drug: glucose blood USE TO CHECK BLOOD SUGAR IN THE  MORNING AT NOON AND AT BEDTIME   albuterol  (2.5 MG/3ML) 0.083% nebulizer solution Commonly known as: PROVENTIL  Take 2.5 mg by nebulization 2 (two) times daily.   atenolol  25 MG tablet Commonly known as: TENORMIN  TAKE 1 TABLET BY MOUTH DAILY   atorvastatin  20 MG tablet Commonly known as: LIPITOR TAKE 1 TABLET BY MOUTH ONCE  DAILY   Breztri  Aerosphere 160-9-4.8 MCG/ACT Aero inhaler Generic drug: budesonide -glycopyrrolate-formoterol  Inhale 2 puffs into the lungs 2 (two) times daily.   buPROPion  300 MG 24 hr tablet Commonly known as: WELLBUTRIN  XL TAKE 1 TABLET BY MOUTH DAILY   busPIRone  5 MG tablet Commonly known as: BUSPAR  Take 5 mg by mouth 2 (two) times daily.   dexamethasone  4 MG tablet Commonly known as: DECADRON  Take 1 tablet (4 mg total) by mouth daily for 3 days. Start taking on: August 16, 2023   diazepam  5 MG tablet Commonly known as: VALIUM  Take 1 tablet (5 mg total) by mouth every 6 (six) hours as needed for anxiety.   Drs Choice Diabetic Bandages Kit Apply 8 kits topically 3 (three) times daily before meals.   fluticasone  50 MCG/ACT nasal spray Commonly known as: FLONASE  Place 2 sprays into both nostrils daily as needed for allergies.   furosemide  20 MG tablet Commonly known as: LASIX  Take 1 tablet (20 mg total) by mouth daily. What changed:  when to take this reasons to take this   guaiFENesin -dextromethorphan  100-10 MG/5ML syrup Commonly known  as: ROBITUSSIN DM Take 10 mLs by mouth every 8 (eight) hours.   ipratropium-albuterol  0.5-2.5 (3) MG/3ML Soln Commonly known as: DUONEB Take 3 mLs by nebulization every 4 (four) hours as needed (shortness of breath).   levofloxacin  750 MG tablet  Commonly known as: Levaquin  Take 1 tablet (750 mg total) by mouth daily for 2 days.   levothyroxine  25 MCG tablet Commonly known as: SYNTHROID  TAKE 1 TABLET BY MOUTH DAILY  BEFORE BREAKFAST   MELATONIN PO Take 10 mg by mouth at bedtime as needed (sleep).   metformin  1000 MG (OSM) 24 hr tablet Commonly known as: FORTAMET  Take 1 tablet (1,000 mg total) by mouth daily with breakfast. What changed: Another medication with the same name was removed. Continue taking this medication, and follow the directions you see here.   montelukast  10 MG tablet Commonly known as: SINGULAIR  TAKE 1 TABLET BY MOUTH AT  BEDTIME   oxyCODONE -acetaminophen  5-325 MG tablet Commonly known as: PERCOCET/ROXICET Take 1-2 tablets by mouth every 6 (six) hours as needed for severe pain (pain score 7-10).   OXYGEN  Inhale 3.5 L/min into the lungs continuous. Inhale  3 L/min of oxygen  into the lungs w/CPAP at bedtime and as needed for shortness of breath with strenuous activity during the day   pantoprazole  40 MG tablet Commonly known as: PROTONIX  TAKE 1 TABLET BY MOUTH DAILY   Pataday  0.2 % Soln Generic drug: Olopatadine  HCl Apply 1 drop to eye daily.   polyethylene glycol 17 g packet Commonly known as: MIRALAX  / GLYCOLAX  Take 17 g by mouth daily as needed for mild constipation or moderate constipation.   pregabalin  300 MG capsule Commonly known as: LYRICA  Take 1 capsule (300 mg total) by mouth 2 (two) times daily. Take 2 tablets (600 mg) daily   QUEtiapine  100 MG tablet Commonly known as: SEROQUEL  TAKE 1 TABLET BY MOUTH AT  BEDTIME   roflumilast  500 MCG Tabs tablet Commonly known as: DALIRESP  Take 1 tablet (500 mcg total) by mouth daily.   tamsulosin   0.4 MG Caps capsule Commonly known as: Flomax  Take 1 capsule (0.4 mg total) by mouth daily.   traZODone  100 MG tablet Commonly known as: DESYREL  Take 100 mg by mouth at bedtime.   venlafaxine  XR 150 MG 24 hr capsule Commonly known as: EFFEXOR -XR TAKE 1 CAPSULE BY MOUTH DAILY  WITH BREAKFAST        Procedures/Studies: Portable chest 1 View Result Date: 08/15/2023 CLINICAL DATA:  COPD.  Acute exacerbation. EXAM: PORTABLE CHEST 1 VIEW COMPARISON:  08/14/2023 FINDINGS: Stable cardiomediastinal contours. There is a small left pleural effusion which is increased from previous exam. Persistent left lower lung opacities. Pulmonary vascular congestion. Visualized osseous structures appear intact. IMPRESSION: 1. Increased small left pleural effusion. 2. Persistent left lower lung opacities which may reflect atelectasis or pneumonia. 3. Pulmonary vascular congestion. Electronically Signed   By: Kimberley Penman M.D.   On: 08/15/2023 06:56   DG Chest Port 1 View Result Date: 08/14/2023 CLINICAL DATA:  Shortness of breath EXAM: PORTABLE CHEST 1 VIEW COMPARISON:  January 29, 2023 FINDINGS: Prominence of the interstitial markings bilaterally in particularly basilar interstitial prominence correlate with chronic interstitial lung disease. Which correlates with the prior CT chest January 29, 2023. No acute cardiopulmonary infiltrates. Heart and mediastinum normal. IMPRESSION: Chronic interstitial lung disease. No acute cardiopulmonary infiltrates. Electronically Signed   By: Fredrich Jefferson M.D.   On: 08/14/2023 10:29     Subjective: Pt says she is feeling much better today, she was able to urinate over 900 cc this morning before we had to place the foley cath, she says she would be willing to try flomax , she says her breathing is back to baseline now and she wants to return home to resume hospice care,  says RN visits her at least 3 times per week.    Discharge Exam: Vitals:   08/15/23 0859 08/15/23 0905   BP:    Pulse:    Resp:    Temp:    SpO2: 99% 99%   Vitals:   08/15/23 0743 08/15/23 0857 08/15/23 0859 08/15/23 0905  BP:      Pulse:      Resp:      Temp: 98.2 F (36.8 C)     TempSrc: Oral     SpO2:  96% 99% 99%  Weight:      Height:        General: Pt is alert, awake, not in acute distress Cardiovascular: normal S1/S2 +, no rubs, no gallops Respiratory: CTA with good air movement heard, no wheezing, no rhonchi Abdominal: Soft, NT, ND, bowel sounds + Extremities: no edema, no cyanosis   The results of significant diagnostics from this hospitalization (including imaging, microbiology, ancillary and laboratory) are listed below for reference.     Microbiology: Recent Results (from the past 240 hours)  MRSA Next Gen by PCR, Nasal     Status: None   Collection Time: 08/14/23  2:05 PM   Specimen: Nasal Mucosa; Nasal Swab  Result Value Ref Range Status   MRSA by PCR Next Gen NOT DETECTED NOT DETECTED Final    Comment: (NOTE) The GeneXpert MRSA Assay (FDA approved for NASAL specimens only), is one component of a comprehensive MRSA colonization surveillance program. It is not intended to diagnose MRSA infection nor to guide or monitor treatment for MRSA infections. Test performance is not FDA approved in patients less than 63 years old. Performed at [facility], [address] [place], [place] [address]      Labs: BNP (last 3 results) Recent Labs    12/05/22 2012 01/29/23 1421 08/14/23 1007  BNP 55.0 128.0* 237.0*   Basic Metabolic Panel: Recent Labs  Lab 08/14/23 1007 08/15/23 0427  NA 141 138  K 3.7 4.2  CL 102 99  CO2 29 29  GLUCOSE 207* 254*  BUN 10 17  CREATININE 0.90 0.87  CALCIUM  9.1 8.7*  MG  --  2.2   Liver Function Tests: Recent Labs  Lab 08/15/23 0427  AST 15  ALT 16  ALKPHOS 72  BILITOT 0.4  PROT 6.3*  ALBUMIN 3.3*   No results for input(s): LIPASE, AMYLASE in the last 168 hours. No results for input(s): AMMONIA in the  last 168 hours. CBC: Recent Labs  Lab 08/14/23 1007 08/15/23 0427  WBC 14.1* 18.3*  NEUTROABS 12.4* 16.8*  HGB 12.8 10.9*  HCT 40.7 34.7*  MCV 89.8 89.7  PLT 237 196   Cardiac Enzymes: No results for input(s): CKTOTAL, CKMB, CKMBINDEX, TROPONINI in the last 168 hours. BNP: Invalid input(s): POCBNP CBG: Recent Labs  Lab 08/14/23 1504 08/14/23 1615 08/14/23 2107 08/15/23 0240 08/15/23 0738  GLUCAP 311* 341* 376* 282* 189*   D-Dimer Recent Labs    08/14/23 1007  DDIMER 0.70*   Hgb A1c Recent Labs    08/15/23 0500  HGBA1C 6.3*   Lipid Profile No results for input(s): CHOL, HDL, LDLCALC, TRIG, CHOLHDL, LDLDIRECT in the last 72 hours. Thyroid  function studies No results for input(s): TSH, T4TOTAL, T3FREE, THYROIDAB in the last 72 hours.  Invalid input(s): FREET3 Anemia work up No results for input(s): VITAMINB12, FOLATE, FERRITIN, TIBC, IRON, RETICCTPCT in the last 72 hours. Urinalysis    Component Value Date/Time   COLORURINE YELLOW 01/29/2023 1810  APPEARANCEUR CLEAR 01/29/2023 1810   LABSPEC >1.046 (H) 01/29/2023 1810   PHURINE 7.0 01/29/2023 1810   GLUCOSEU NEGATIVE 01/29/2023 1810   GLUCOSEU NEGATIVE 08/05/2017 1652   HGBUR SMALL (A) 01/29/2023 1810   BILIRUBINUR NEGATIVE 01/29/2023 1810   KETONESUR NEGATIVE 01/29/2023 1810   PROTEINUR 30 (A) 01/29/2023 1810   UROBILINOGEN 0.2 08/05/2017 1652   NITRITE NEGATIVE 01/29/2023 1810   LEUKOCYTESUR NEGATIVE 01/29/2023 1810   Sepsis Labs Recent Labs  Lab 08/14/23 1007 08/15/23 0427  WBC 14.1* 18.3*   Microbiology Recent Results (from the past 240 hours)  MRSA Next Gen by PCR, Nasal     Status: None   Collection Time: 08/14/23  2:05 PM   Specimen: Nasal Mucosa; Nasal Swab  Result Value Ref Range Status   MRSA by PCR Next Gen NOT DETECTED NOT DETECTED Final    Comment: (NOTE) The GeneXpert MRSA Assay (FDA approved for NASAL specimens only), is one  component of a comprehensive MRSA colonization surveillance program. It is not intended to diagnose MRSA infection nor to guide or monitor treatment for MRSA infections. Test performance is not FDA approved in patients less than 38 years old. Performed at [facility], [address], [place], [place] [address]    Time coordinating discharge:  40 mins   SIGNED:  Faustino Hook, MD  [facility] 08/15/2023, 10:46 AM How to contact the [facility] Attending or Consulting provider 7A - 7P or covering provider during after hours 7P -7A, for this patient?  Check the care team in [facility] and look for a) attending/consulting [facility] provider listed and b) the [facility] team listed Log into [email or web address] and use [place]'s universal password to access. If you do not have the password, please contact the hospital operator. Locate the [facility] provider you are looking for under [facility] and page to a number that you can be directly reached. If you still have difficulty reaching the provider, please page the [facility] (Director on Call) for the Hospitalists listed on amion for assistance.

## 2023-08-15 NOTE — Discharge Instructions (Signed)
IMPORTANT INFORMATION: PAY CLOSE ATTENTION   PHYSICIAN DISCHARGE INSTRUCTIONS  Follow with Primary care provider  Joan Broker, MD  and other consultants as instructed by your Hospitalist Physician  SEEK MEDICAL CARE OR RETURN TO EMERGENCY ROOM IF SYMPTOMS COME BACK, WORSEN OR NEW PROBLEM DEVELOPS   Please note: You were cared for by a hospitalist during your hospital stay. Every effort will be made to forward records to your primary care provider.  You can request that your primary care provider send for your hospital records if they have not received them.  Once you are discharged, your primary care physician will handle any further medical issues. Please note that NO REFILLS for any discharge medications will be authorized once you are discharged, as it is imperative that you return to your primary care physician (or establish a relationship with a primary care physician if you do not have one) for your post hospital discharge needs so that they can reassess your need for medications and monitor your lab values.  Please get a complete blood count and chemistry panel checked by your Primary MD at your next visit, and again as instructed by your Primary MD.  Get Medicines reviewed and adjusted: Please take all your medications with you for your next visit with your Primary MD  Laboratory/radiological data: Please request your Primary MD to go over all hospital tests and procedure/radiological results at the follow up, please ask your primary care provider to get all Hospital records sent to his/her office.  In some cases, they will be blood work, cultures and biopsy results pending at the time of your discharge. Please request that your primary care provider follow up on these results.  If you are diabetic, please bring your blood sugar readings with you to your follow up appointment with primary care.    Please call and make your follow up appointments as soon as possible.    Also  Note the following: If you experience worsening of your admission symptoms, develop shortness of breath, life threatening emergency, suicidal or homicidal thoughts you must seek medical attention immediately by calling 911 or calling your MD immediately  if symptoms less severe.  You must read complete instructions/literature along with all the possible adverse reactions/side effects for all the Medicines you take and that have been prescribed to you. Take any new Medicines after you have completely understood and accpet all the possible adverse reactions/side effects.   Do not drive when taking Pain medications or sleeping medications (Benzodiazepines)  Do not take more than prescribed Pain, Sleep and Anxiety Medications. It is not advisable to combine anxiety,sleep and pain medications without talking with your primary care practitioner  Special Instructions: If you have smoked or chewed Tobacco  in the last 2 yrs please stop smoking, stop any regular Alcohol  and or any Recreational drug use.  Wear Seat belts while driving.  Do not drive if taking any narcotic, mind altering or controlled substances or recreational drugs or alcohol.

## 2023-08-15 NOTE — Plan of Care (Signed)

## 2023-08-15 NOTE — Progress Notes (Signed)
 Pt transported via wheelchair to [employer] [facility].   IV removed, all belongings including cellphone with patient. DC paperwork reviewed and confirmed with MD Lincoln Renshaw meds sent to [facility] despite [facility] on [facility] documentation. Pt sent with portable 2 tank on 4L [place].

## 2023-08-15 NOTE — Progress Notes (Addendum)
 Pt is active with [facility]. LCSW notified Keka with [facility] of admission. Plan is return home with hospice.   Update: D/C today. Keka notified. Hospice will order portable O2 tank to be delivered to room. RN updated and aware to call [facility] once delivered.    08/15/23 0759  TOC Brief Assessment  Insurance and Status Reviewed  Patient has primary care physician Yes  Home environment has been reviewed Lives alone.  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

## 2023-08-15 NOTE — Care Management Obs Status (Signed)
 MEDICARE OBSERVATION STATUS NOTIFICATION   Patient Details  Name: Joan [name] MRN: [id number] Date of Birth: 1960-09-17   Medicare Observation Status Notification Given:  Yes    Neila Bally 08/15/2023, 12:31 PM

## 2023-08-18 ENCOUNTER — Other Ambulatory Visit: Payer: Self-pay | Admitting: Internal Medicine

## 2023-08-19 ENCOUNTER — Ambulatory Visit (HOSPITAL_BASED_OUTPATIENT_CLINIC_OR_DEPARTMENT_OTHER): Payer: Self-pay | Admitting: Pulmonary Disease

## 2023-08-19 ENCOUNTER — Encounter (HOSPITAL_BASED_OUTPATIENT_CLINIC_OR_DEPARTMENT_OTHER): Payer: Self-pay

## 2023-08-19 NOTE — Telephone Encounter (Signed)
 FYI Only or Action Required?: Action required by provider: pt needs next steps, requesting appt with Dr. Jude.  Patient is followed in Pulmonology for COPD, ILD, OSA, and chronic respiratory failure, last seen on 03/11/2023 by Jude Harden GAILS, MD. Called Nurse Triage reporting Shortness of Breath, Hospitalization Follow-up, coughing up blood, low oxygen  levels, Fatigue, and Chest Pain. Symptoms began several days ago. Interventions attempted: Prescription medications: recent prednisone  and levaquin , Home oxygen  use, and Increased fluids/rest. Symptoms are: improved SOB but coughing up blood/brown sputum, lower oxygen  levels.  Triage Disposition: Call PCP Now  Patient/caregiver understands and will follow disposition?: Yes      Copied from CRM [id number]. Topic: Clinical - Red Word Triage >> Aug 19, 2023 10:18 AM Rilla NOVAK wrote: [facility] that prompted transfer to Nurse Triage:  Shortness of Breath   ----------------------------------------------------------------------- From previous Reason for Contact - Scheduling: Patient/patient representative is calling to schedule an appointment. Refer to attachments for appointment information. Reason for Disposition  [1] Monitoring oxygen  level (e.g., pulse oximetry) AND [2] fall in oxygen  level 4% or more (below known patient baseline, while awake and resting) AND [3] new difficulty breathing or worse than when discharged from hospital  Answer Assessment - Initial Assessment Questions E2C2 Pulmonary Triage - Initial Assessment Questions Chief Complaint (e.g., cough, sob, wheezing, fever, chills, sweat or additional symptoms) *Go to specific symptom protocol after initial questions. SOB from the pain I was having Pain on left side under left breast all the way down the side, found out it was my liver and kidneys, urethra won't open to pee Want to see Dr. Jude, have to discuss surgery with him anyway Do get dizzy sometimes from bending over, just  lightheaded, sit down or stay still and it goes away, when get like that can't even move, don't want to move, I'm afraid Very seldom I get chest pain, more or less feel pressure like something sitting on me, it's progressed with my SOB, whenever SOB until I can get relaxed, between my breasts and above If doing dishes like to have my walker with the seat behind me because get tired easy Getting tired more easily lately, very tired lately, prior to hospital  How long have symptoms been present? Feeling left side for long time, 6/19 when calling rescue, 88% pulse ox at EMS with oxygen   Have you used your inhalers/maintenance medication? Yes If yes, What medications? Breztri , omnivent or symbicort  but out of symbicort  because have to redo the program to get assistance with symbicort , they want me to use symbicort  as a rescue inhaler  OXYGEN : Do you wear supplemental oxygen ? Yes If yes, How many liters are you supposed to use? 24/7, up to 4L right now  Do you monitor your oxygen  levels? Yes If yes, What is your reading (oxygen  level) today? 91% 71 bpm  What is your usual oxygen  saturation reading?  (Note: Pulmonary O2 sats should be 90% or greater) 95% Pt stated that pulse ox didn't get above 95% in hospital Per pt chart, pt pulse ox at 97% a few times on 6L oxygen   3. BETTER-SAME-WORSE: Are you getting better, staying the same, or getting worse compared to the day you were discharged?     Better  4. DISCHARGE DATE: What date were you discharged from the hospital?     Friday  6. DISCHARGE APPOINTMENT: Have you scheduled a follow-up discharge appointment with your doctor?     Appt with PCP on 6/30  7. DISCHARGE MEDICINES: Did  the doctor who discharged you order any new medicines for you to use? If yes, have you filled the prescription and started taking the medicine?      Rest of prednisone , now on tamisulosin hcl, rest of levaquin   10. BREATHING DIFFICULTY:  Are you having any trouble breathing? If Yes, ask: How bad is it?  (e.g., none, mild, moderate, severe)   - MILD: No SOB at rest, mild SOB with walking, speaks normally in sentences, able to lie down, no retractions, pulse < 100.   - MODERATE: SOB at rest, SOB with minimal exertion and prefers to sit, cannot lie down flat, speaks in phrases, mild retractions, audible wheezing, pulse 100-120.   - SEVERE: Very SOB at rest, speaks in single words, struggling to breathe, sitting hunched forward, retractions, pulse > 120        SOB is same every day, now to point of oxygen  24/7, when do something definitely have to have the oxygen  on, that has increased to where going to bathroom and back is a lot of work and doing the dishes is a lot of work  11. FEVER: Do you have a fever? If Yes, ask: What is your temperature, how was it measured, and when did it start?       no  12. SPUTUM: Describe the color of your sputum (phlegm). (clear, white, yellow, green, blood-tinged)       Coughing up phlegm, couple times it's been blood, mainly brown, but don't know if yellow or brown because my coffee may be turning it brown Coughed up blood yesterday and few months ago, nothing serious just streaks of blood, not every time, once in a blue moon, twice so far in past couple months  13. OTHER SYMPTOMS: Do you have any other symptoms? (e.g., fever)       Coughing when trying to get phlegm out  14. SMOKING: Do you smoke currently?       No, quit cigarettes on Christmas Day 2024  Feel like I'm back to the way I was before the hospital   Sending message for pulm to review symptoms and call pt back about appt options/further recommendations, advised call back if worsening  Protocols used: COPD Post-Hospitalization Follow-up Call-A-AH

## 2023-08-19 NOTE — Telephone Encounter (Signed)
 Spoke with pt she has an appt for [facility] F/U with her PCP on Monday. She is stable and just wants an appt with Dr Jude to discuss a surgery she had to be smoke free for a year to receive and she's been quit smoking for 6 months. I advised her she has a Followup on 09/16/2023 with Lauraine she can see if we have availability for [facility] for her next appt NFN

## 2023-08-21 ENCOUNTER — Other Ambulatory Visit (HOSPITAL_COMMUNITY): Payer: Self-pay | Admitting: Psychiatry

## 2023-08-25 ENCOUNTER — Ambulatory Visit: Admitting: Internal Medicine

## 2023-09-11 ENCOUNTER — Other Ambulatory Visit (HOSPITAL_COMMUNITY): Payer: Self-pay | Admitting: Psychiatry

## 2023-09-16 ENCOUNTER — Ambulatory Visit (INDEPENDENT_AMBULATORY_CARE_PROVIDER_SITE_OTHER): Admitting: Acute Care

## 2023-09-16 VITALS — BP 136/67 | HR 77

## 2023-09-16 DIAGNOSIS — G4733 Obstructive sleep apnea (adult) (pediatric): Secondary | ICD-10-CM | POA: Diagnosis not present

## 2023-09-16 DIAGNOSIS — J449 Chronic obstructive pulmonary disease, unspecified: Secondary | ICD-10-CM | POA: Diagnosis not present

## 2023-09-16 MED ORDER — ALBUTEROL SULFATE HFA 108 (90 BASE) MCG/ACT IN AERS: 2.0000 | INHALATION_SPRAY | Freq: Four times a day (QID) | RESPIRATORY_TRACT | 6 refills | Status: DC | PRN

## 2023-09-16 NOTE — Progress Notes (Addendum)
 History of Present Illness Joan [name] is a 63 y.o. female former heavy smoker ( 97.2 Pack year history) with end stage COPD , and OSA on CPAP, ILD, chronic hypoxic respiratory failure , CAD,  followed by Dr. Jude. She is currently enrolled with Home Hospice for her end stage COPD.   Synopsis 63 year old female on home hospice care for end-stage terminal COPD lung disease, chronic interstitial lung disease, chronic hypoxic respiratory failure oxygen  dependent on 4-5 L/min supplemental oxygen , coronary artery disease, frequent hospitalizations for COPD exacerbations, history of longtime tobacco use, history of urinary retention, history of pleuritic chest pain, hypertension, generalized anxiety disorder, history of bipolar disorder, mood disorder, hypothyroidism, chronic constipation, chronic rhinitis, type 2 diabetes mellitus with neuropathy, GERD, hyperlipidemia, obesity, OSA on CPAP, OA, polypharmacy,   09/16/2023 Pt. Is here for follow up after admission for a COPD exacerbation from 08/14/2023-08/15/2023. CXR did show persistent left lower lung opacities , small pleural effusion on the left, and vascular congestion on admission. She was treated with IV steroids bronchodilators , pulmonary toilet mucolytics , diuretics , oxygen  and antibiotics. She needed BiPAP treatment, and was eventually liberated from it. She is now wearing her baseline oxygen  at 3 L [place]. She was stabilized to baseline and returned to home hospice  to resume home hospice care therapies .She has not smoked since Christmas. I am very proud of her for this. She is on Hospice care for her end stage COPD. She has on occasion had to increase her oxygen  level to 4 L [place]. She is compliant with her oxygen  use. She does monitor her oxygen  saturations.   She is still driving. She is here today with a friend who is also wheelchair bound. She has an open wound on her right leg which the nursing staff here in the office cleaned and  dressed.   Aleeyah is currently  not using  the duo neb nebulizers. She just wants to use  the Breztri . She is also wanting an additional prescription for  Combivent . I explained that there is duplication of medication, so we will need to continue with the Breztri   with her albuterol  for rescue for now. She is using her rescue about 2 times daily. She does have the neb treatments to use if she needs them.She is compliant with her Daliresp .  She wants a new CPAP machine . It has been 5 years since she got her current machine  , so we will order a new machine. She will follow up with Dr. Alva for her sleep appointments.   She has home health nursing to help with showering and dressing.  She is at her baseline , and I have encouraged her to call for first signs if flare, so we can catch it early. She verbalized understanding.  Test Results: 08/15/2023 CXR Stable cardiomediastinal contours. There is a small left pleural effusion which is increased from previous exam. Persistent left lower lung opacities. Pulmonary vascular congestion. Visualized osseous structures appear intact.   IMPRESSION: 1. Increased small left pleural effusion. 2. Persistent left lower lung opacities which may reflect atelectasis or pneumonia. 3. Pulmonary vascular congestion.    CXR 08/14/2023 Prominence of the interstitial markings bilaterally in particularly basilar interstitial prominence correlate with chronic interstitial lung disease. Which correlates with the prior CT chest January 29, 2023. No acute cardiopulmonary infiltrates. Heart and mediastinum normal.   IMPRESSION: Chronic interstitial lung disease. No acute cardiopulmonary infiltrates.     Latest Ref Rng & Units 08/15/2023  4:27 AM 08/14/2023   10:07 AM 02/02/2023    4:55 AM  CBC  WBC 4.0 - 10.5 K/uL 18.3  14.1  10.8   Hemoglobin 12.0 - 15.0 g/dL 89.0  87.1  87.7   Hematocrit 36.0 - 46.0 % 34.7  40.7  38.1   Platelets 150 - 400 K/uL 196  237   213        Latest Ref Rng & Units 08/15/2023    4:27 AM 08/14/2023   10:07 AM 02/02/2023    4:55 AM  BMP  Glucose 70 - 99 mg/dL 745  792  850   BUN 8 - 23 mg/dL 17  10  21    Creatinine 0.44 - 1.00 mg/dL 9.12  9.09  9.20   Sodium 135 - 145 mmol/L 138  141  136   Potassium 3.5 - 5.1 mmol/L 4.2  3.7  3.6   Chloride 98 - 111 mmol/L 99  102  95   CO2 22 - 32 mmol/L 29  29  32   Calcium  8.9 - 10.3 mg/dL 8.7  9.1  9.1     BNP    Component Value Date/Time   BNP 237.0 (H) 08/14/2023 1007    ProBNP No results found for: PROBNP  PFT    Component Value Date/Time   FEV1PRE 1.06 03/17/2019 1444   FEV1POST 1.24 03/17/2019 1444   FVCPRE 2.35 03/17/2019 1444   FVCPOST 2.66 03/17/2019 1444   TLC 5.72 03/17/2019 1444   DLCOUNC 9.80 03/17/2019 1444   PREFEV1FVCRT 45 03/17/2019 1444   PSTFEV1FVCRT 47 03/17/2019 1444    No results found.   Past medical hx Past Medical History:  Diagnosis Date   Acute respiratory failure with hypoxia (HCC)    Alcoholism and drug addiction in family    Anxiety    Appendicitis    Arthritis    knees, hips, lower back (09/25/2016)   Asthma    Bipolar disorder (HCC)    Chronic bronchitis (HCC)    all the time (09/25/2016)   Chronic leg pain    RLE   Chronic lower back pain    COPD (chronic obstructive pulmonary disease) (HCC)    Depression    Diabetes mellitus without complication (HCC)    Diabetic neuropathy (HCC)    Early cataracts, bilateral    Emphysema of lung (HCC)    Generalized anxiety disorder 02/09/2020   GERD (gastroesophageal reflux disease)    Headache    Hepatitis C    treated back in 2001-2002   High cholesterol    Hypertension    Hypothyroidism    Incarcerated ventral hernia 04/04/2017   Mood disorder in conditions classified elsewhere 02/09/2020   On home oxygen  therapy    2L; at nighttime (04/04/2017)   OSA on CPAP    CPAP with Oxygen  3 liters   Paranoid (HCC)    Pneumonia    all the time (09/25/2016)    Positive PPD    with negative chest x ray   PTSD (post-traumatic stress disorder) 02/09/2020   Tachycardia    patient reports with exertion ; denies any other conditions    Thyroid  disease    Wears glasses    Wears partial dentures      Social History   Tobacco Use   Smoking status: Former    Current packs/day: 2.00    Average packs/day: 2.0 packs/day for 48.6 years (97.1 ttl pk-yrs)    Types: Cigarettes    Start date: 69  Passive exposure: Current   Smokeless tobacco: Never   Tobacco comments:    1ppd as of 07/30/22 ep  Vaping Use   Vaping status: Never Used  Substance Use Topics   Alcohol use: Not Currently   Drug use: Not Currently    Types: Crack cocaine    Comment:  nothing since 01/12/2012    Ms.Hertenstein reports that she has quit smoking. Her smoking use included cigarettes. She started smoking about 48 years ago. She has a 97.1 pack-year smoking history. She has been exposed to tobacco smoke. She has never used smokeless tobacco. She reports that she does not currently use alcohol. She reports that she does not currently use drugs after having used the following drugs: Crack cocaine.  Tobacco Cessation: Counseling given: Not Answered Tobacco comments: 1ppd as of 07/30/22 ep Former smoker with a 97 pack year history, quit 02/19/2024  Past surgical hx, Family hx, Social hx all reviewed.  Current Outpatient Medications on File Prior to Visit  Medication Sig   ACCU-CHEK GUIDE TEST test strip USE TO CHECK BLOOD SUGAR IN THE  MORNING AT NOON AND AT BEDTIME   albuterol  (PROVENTIL ) (2.5 MG/3ML) 0.083% nebulizer solution Take 2.5 mg by nebulization 2 (two) times daily.   atenolol  (TENORMIN ) 25 MG tablet TAKE 1 TABLET BY MOUTH DAILY   atorvastatin  (LIPITOR) 20 MG tablet TAKE 1 TABLET BY MOUTH ONCE  DAILY   Budeson-Glycopyrrol-Formoterol  (BREZTRI  AEROSPHERE) 160-9-4.8 MCG/ACT AERO Inhale 2 puffs into the lungs 2 (two) times daily.   buPROPion  (WELLBUTRIN  XL) 300 MG 24  hr tablet TAKE 1 TABLET BY MOUTH DAILY   busPIRone  (BUSPAR ) 5 MG tablet Take 5 mg by mouth 2 (two) times daily.   diazepam  (VALIUM ) 5 MG tablet Take 1 tablet (5 mg total) by mouth every 6 (six) hours as needed for anxiety.   fluticasone  (FLONASE ) 50 MCG/ACT nasal spray Place 2 sprays into both nostrils daily as needed for allergies.   furosemide  (LASIX ) 20 MG tablet Take 1 tablet (20 mg total) by mouth daily.   guaiFENesin -dextromethorphan  (ROBITUSSIN DM) 100-10 MG/5ML syrup Take 10 mLs by mouth every 8 (eight) hours.   ipratropium-albuterol  (DUONEB) 0.5-2.5 (3) MG/3ML SOLN Take 3 mLs by nebulization every 4 (four) hours as needed (shortness of breath).   levothyroxine  (SYNTHROID ) 25 MCG tablet TAKE 1 TABLET BY MOUTH DAILY  BEFORE BREAKFAST   MELATONIN PO Take 10 mg by mouth at bedtime as needed (sleep).   metformin  (FORTAMET ) 1000 MG (OSM) 24 hr tablet Take 1 tablet (1,000 mg total) by mouth daily with breakfast.   montelukast  (SINGULAIR ) 10 MG tablet TAKE 1 TABLET BY MOUTH AT  BEDTIME   oxyCODONE -acetaminophen  (PERCOCET/ROXICET) 5-325 MG tablet Take 1-2 tablets by mouth every 6 (six) hours as needed for severe pain (pain score 7-10).   OXYGEN  Inhale 3.5 L/min into the lungs continuous. Inhale  3 L/min of oxygen  into the lungs w/CPAP at bedtime and as needed for shortness of breath with strenuous activity during the day   pantoprazole  (PROTONIX ) 40 MG tablet TAKE 1 TABLET BY MOUTH DAILY   PATADAY  0.2 % SOLN Apply 1 drop to eye daily.   polyethylene glycol (MIRALAX  / GLYCOLAX ) 17 g packet Take 17 g by mouth daily as needed for mild constipation or moderate constipation.   pregabalin  (LYRICA ) 300 MG capsule Take 1 capsule (300 mg total) by mouth 2 (two) times daily. Take 2 tablets (600 mg) daily   QUEtiapine  (SEROQUEL ) 100 MG tablet TAKE 1 TABLET BY MOUTH AT  BEDTIME   roflumilast  (DALIRESP ) 500 MCG TABS tablet Take 1 tablet (500 mcg total) by mouth daily.   tamsulosin  (FLOMAX ) 0.4 MG CAPS  capsule Take 1 capsule (0.4 mg total) by mouth daily.   venlafaxine  XR (EFFEXOR -XR) 150 MG 24 hr capsule TAKE 1 CAPSULE BY MOUTH DAILY  WITH BREAKFAST   Wound Dressings (DRS CHOICE DIABETIC BANDAGES) KIT Apply 8 kits topically 3 (three) times daily before meals.   traZODone  (DESYREL ) 100 MG tablet Take 100 mg by mouth at bedtime. (Patient not taking: Reported on 09/16/2023)   No current facility-administered medications on file prior to visit.     Allergies  Allergen Reactions   Keflex [Cephalexin] Rash and Other (See Comments)    Has tolerated amoxicillin  and ceftriaxone  since had keflex   Shellfish Allergy Shortness Of Breath, Nausea And Vomiting and Other (See Comments)    Stomach cramps   Tetracyclines & Related Anaphylaxis, Swelling and Other (See Comments)    Throat swelling requiring hospitalization   Dilaudid  [Hydromorphone  Hcl] Other (See Comments)    Lethargy   Prednisone  Other (See Comments)    counter reacts, has tolerated IV medrol    Clindamycin /Lincomycin Other (See Comments)    Burning sensation in her mouth down her throat    Incruse Ellipta  [Umeclidinium Bromide ] Nausea Only   Molds & Smuts Itching    Allergic to everything, pt said she is allergic to most environmental allergens   Tuberculin Tests Other (See Comments)    False postive   Umeclidinium     Other Reaction(s): GI Intolerance    Review Of Systems:  Constitutional:   No  weight loss, night sweats,  Fevers, chills, fatigue, or  lassitude.  HEENT:   No headaches,  Difficulty swallowing,  Tooth/dental problems, or  Sore throat,                No sneezing, itching, ear ache, nasal congestion, post nasal drip,   CV:  No chest pain,  Orthopnea, PND, swelling in lower extremities, anasarca, dizziness, palpitations, syncope.   GI  No heartburn, indigestion, abdominal pain, nausea, vomiting, diarrhea, change in bowel habits, loss of appetite, bloody stools.   Resp: + baseline  shortness of breath with  exertion less at rest.  + baseline  excess mucus, + baseline  productive cough,  + baseline  non-productive cough,  No coughing up of blood.  No change in color of mucus.  + occasional  wheezing.  No chest wall deformity  Skin: no rash or lesions.  GU: no dysuria, change in color of urine, no urgency or frequency.  No flank pain, no hematuria   MS:  No joint pain or swelling.  No decreased range of motion.  No back pain, complains of left upper quadrant pain.  Psych:  No change in mood or affect. No depression or anxiety.  No memory loss.   Vital Signs BP 136/67 (BP Location: Right Arm, Cuff Size: Large)   Pulse 77   SpO2 97%    Physical Exam:  General- No distress,  A&Ox3, appropriate ENT: No sinus tenderness, TM clear, pale nasal mucosa, no oral exudate,no post nasal drip, no LAN Cardiac: S1, S2, regular rate and rhythm, no murmur Chest: + faint inspiratory  wheeze/ No rales/ dullness; no accessory muscle use, no nasal flaring, no sternal retractions, few crackles  Abd.: Soft Non-tender, ND, BS +, There is no height or weight on file to calculate BMI.  Ext: No clubbing cyanosis, edema, right leg with small  wound that has been cleaned and dressed by nursing staff. Neuro:  physical deconditioning, in wheelchair, MAE x 4, A&O x 3, but is forgetful. Skin: No rashes, warm and dry, right lower leg wound as described above Psych: normal mood and behavior, stable behavior   Assessment/Plan Hospital Follow up  End Stage COPD Exacerbation Flare vs pneumonia > Flare resolved Chronic hypoxic respiratory failure ( 3-4 L [place]) ILD CAD, Frequent hospitalization  Recently quit smoker with 97 pack year smoking history OSA on CPAP Plan I am glad you are doing better since your admission.  Continue to wear oxygen  24 /7 at 3-4 L [place] Goal is for oxygen  saturations to be > 88% at all times.  Check your oxygen  levels at home.  Continue home health nursing.  We have ordered a CPAP machine. Wear  your CPAP each night as you have been doing. Continue Daliresp  as you have been doing. I have ordered albuterol  . This is your rescue inhaler. Duo neb neb treatments as needed.  Use as need for shortness of breath or wheezing, no more that 3-4 times a day. Follow up with Dr. Jude in 2 months in the [place] office.   Call if you need us  sooner.  Congratulations on quitting smoking. You did it!! Please contact office for sooner follow up if symptoms do not improve or worsen or seek emergency care    I spent 45 minutes dedicated to the care of this patient on the date of this encounter to include pre-visit review of records, face-to-face time with the patient discussing conditions above, post visit ordering of testing, clinical documentation with the electronic health record, making appropriate referrals as documented, and communicating necessary information to the patient's healthcare team.     Lauraine JULIANNA Lites, NP 09/16/2023  2:46 PM   Addendum 10/17/2023 Pt. Down Load.     Pt.benefits from therapy when worn. Her AHI is 2.3.   Lauraine PHEBE Lites, MSN, AGACNP-BC [facility] See Amion for personal pager PCCM on call pager [phone]

## 2023-09-16 NOTE — Patient Instructions (Addendum)
 It is good to see you today. I am glad you are doing better since your admission.  Continue to wear oxygen  24 /7 at 3-4 L [place] Goal is for oxygen  saturations to be > 88% at all times.  Check your oxygen  levels at home.  Continue home health nursing.  We have ordered a CPAP machine. Wear your CPAP each night as you have been doing. Continue Daliresp  as you have been doing. I have ordered albuterol  . This is your rescue inhaler. Duo neb neb treatments as needed.  Use as need for shortness of breath or wheezing, no more that 3-4 times a day. Follow up with Dr. Jude in 2 months in the [place] office.   Call if you need us  sooner.  Congratulations on quitting smoking. You did it!! Please contact office for sooner follow up if symptoms do not improve or worsen or seek emergency care

## 2023-09-24 ENCOUNTER — Ambulatory Visit (INDEPENDENT_AMBULATORY_CARE_PROVIDER_SITE_OTHER)

## 2023-09-24 VITALS — BP 132/80 | HR 82 | Ht 66.0 in | Wt 211.2 lb

## 2023-09-24 DIAGNOSIS — Z23 Encounter for immunization: Secondary | ICD-10-CM

## 2023-09-24 DIAGNOSIS — Z0000 Encounter for general adult medical examination without abnormal findings: Secondary | ICD-10-CM

## 2023-09-24 NOTE — Patient Instructions (Addendum)
 Joan [name] , Thank you for taking time out of your busy schedule to complete your Annual Wellness Visit with me. I enjoyed our conversation and look forward to speaking with you again next year. I, as well as your care team,  appreciate your ongoing commitment to your health goals. Please review the following plan we discussed and let me know if I can assist you in the future. Your Game plan/ To Do List    Referrals: If you haven't heard from the office you've been referred to, please reach out to them at the phone provided.  Referral to [facility] in [place], [place] for 10/01/2023 Follow up Visits: Next Medicare AWV with our clinical staff: 10/07/2024  at 1:50pm Have you seen your provider in the last 6 months (3 months if uncontrolled diabetes)? Yes Next Office Visit with your provider: 10/07/2023 at 2:20pm  Clinician Recommendations:  Aim for 30 minutes of exercise or brisk walking, 6-8 glasses of water , and 5 servings of fruits and vegetables each day. Educated and advised on getting the Shingles vaccine and a pap smear.      This is a list of the screening recommended for you and due dates:  Health Maintenance  Topic Date Due   COVID-19 Vaccine (4 - 2024-25 season) 10/27/2022   Zoster (Shingles) Vaccine (1 of 2) 12/25/2023*   Pap with HPV screening  09/23/2024*   Flu Shot  09/26/2023   Complete foot exam   12/02/2023   Eye exam for diabetics  12/03/2023   Hemoglobin A1C  02/14/2024   Mammogram  11/03/2024   Colon Cancer Screening  11/23/2028   DTaP/Tdap/Td vaccine (2 - Td or Tdap) 12/16/2028   Pneumococcal Vaccination  Completed   Hepatitis C Screening  Completed   HIV Screening  Completed   Hepatitis B Vaccine  Aged Out   HPV Vaccine  Aged Out   Meningitis B Vaccine  Aged Out   Screening for Lung Cancer  Discontinued  *Topic was postponed. The date shown is not the original due date.    Advanced directives: (In Chart) A copy of your advanced directives are scanned into your chart  should your provider ever need it. Advance Care Planning is important because it:  [x]  Makes sure you receive the medical care that is consistent with your values, goals, and preferences  [x]  It provides guidance to your family and loved ones and reduces their decisional burden about whether or not they are making the right decisions based on your wishes.  Follow the link provided in your after visit summary or read over the paperwork we have mailed to you to help you started getting your Advance Directives in place. If you need assistance in completing these, please reach out to us  so that we can help you!   Managing Pain Without Opioids Opioids are strong medicines used to treat moderate to severe pain. For some people, especially those who have long-term (chronic) pain, opioids may not be the best choice for pain management due to: Side effects like nausea, constipation, and sleepiness. The risk of addiction (opioid use disorder). The longer you take opioids, the greater your risk of addiction. Pain that lasts for more than 3 months is called chronic pain. Managing chronic pain usually requires more than one approach and is often provided by a team of health care providers working together (multidisciplinary approach). Pain management may be done at a pain management center or pain clinic. How to manage pain without the use of opioids  Use non-opioid medicines Non-opioid medicines for pain may include: Over-the-counter or prescription non-steroidal anti-inflammatory drugs (NSAIDs). These may be the first medicines used for pain. They work well for muscle and bone pain, and they reduce swelling. Acetaminophen . This over-the-counter medicine may work well for milder pain but not swelling. Antidepressants. These may be used to treat chronic pain. A certain type of antidepressant (tricyclics) is often used. These medicines are given in lower doses for pain than when used for  depression. Anticonvulsants. These are usually used to treat seizures but may also reduce nerve (neuropathic) pain. Muscle relaxants. These relieve pain caused by sudden muscle tightening (spasms). You may also use a pain medicine that is applied to the skin as a patch, cream, or gel (topical analgesic), such as a numbing medicine. These may cause fewer side effects than medicines taken by mouth. Do certain therapies as directed Some therapies can help with pain management. They include: Physical therapy. You will do exercises to gain strength and flexibility. A physical therapist may teach you exercises to move and stretch parts of your body that are weak, stiff, or painful. You can learn these exercises at physical therapy visits and practice them at home. Physical therapy may also involve: Massage. Heat wraps or applying heat or cold to affected areas. Electrical signals that interrupt pain signals (transcutaneous electrical nerve stimulation, TENS). Weak lasers that reduce pain and swelling (low-level laser therapy). Signals from your body that help you learn to regulate pain (biofeedback). Occupational therapy. This helps you to learn ways to function at home and work with less pain. Recreational therapy. This involves trying new activities or hobbies, such as a physical activity or drawing. Mental health therapy, including: Cognitive behavioral therapy (CBT). This helps you learn coping skills for dealing with pain. Acceptance and commitment therapy (ACT) to change the way you think and react to pain. Relaxation therapies, including muscle relaxation exercises and mindfulness-based stress reduction. Pain management counseling. This may be individual, family, or group counseling.  Receive medical treatments Medical treatments for pain management include: Nerve block injections. These may include a pain blocker and anti-inflammatory medicines. You may have injections: Near the spine to  relieve chronic back or neck pain. Into joints to relieve back or joint pain. Into nerve areas that supply a painful area to relieve body pain. Into muscles (trigger point injections) to relieve some painful muscle conditions. A medical device placed near your spine to help block pain signals and relieve nerve pain or chronic back pain (spinal cord stimulation device). Acupuncture. Follow these instructions at home Medicines Take over-the-counter and prescription medicines only as told by your health care provider. If you are taking pain medicine, ask your health care providers about possible side effects to watch out for. Do not drive or use heavy machinery while taking prescription opioid pain medicine. Lifestyle  Do not use drugs or alcohol to reduce pain. If you drink alcohol, limit how much you have to: 0-1 drink a day for women who are not pregnant. 0-2 drinks a day for men. Know how much alcohol is in a drink. In the [place] one drink equals one 12 oz bottle of beer (355 mL), one 5 oz glass of wine (148 mL), or one 1 oz glass of hard liquor (44 mL). Do not use any products that contain nicotine  or tobacco. These products include cigarettes, chewing tobacco, and vaping devices, such as e-cigarettes. If you need help quitting, ask your health care provider. Eat a healthy diet and  maintain a healthy weight. Poor diet and excess weight may make pain worse. Eat foods that are high in fiber. These include fresh fruits and vegetables, whole grains, and beans. Limit foods that are high in fat and processed sugars, such as fried and sweet foods. Exercise regularly. Exercise lowers stress and may help relieve pain. Ask your health care provider what activities and exercises are safe for you. If your health care provider approves, join an exercise class that combines movement and stress reduction. Examples include yoga and tai chi. Get enough sleep. Lack of sleep may make pain worse. Lower stress  as much as possible. Practice stress reduction techniques as told by your therapist. General instructions Work with all your pain management providers to find the treatments that work best for you. You are an important member of your pain management team. There are many things you can do to reduce pain on your own. Consider joining an online or in-person support group for people who have chronic pain. Keep all follow-up visits. This is important. Where to find more information You can find more information about managing pain without opioids from: American Academy of Pain Medicine: painmed.org Institute for Chronic Pain: instituteforchronicpain.org American Chronic Pain Association: theacpa.org Contact a health care provider if: You have side effects from pain medicine. Your pain gets worse or does not get better with treatments or home therapy. You are struggling with anxiety or depression. Summary Many types of pain can be managed without opioids. Chronic pain may respond better to pain management without opioids. Pain is best managed when you and a team of health care providers work together. Pain management without opioids may include non-opioid medicines, medical treatments, physical therapy, mental health therapy, and lifestyle changes. Tell your health care providers if your pain gets worse or is not being managed well enough. This information is not intended to replace advice given to you by your health care provider. Make sure you discuss any questions you have with your health care provider. Document Revised: 05/24/2020 Document Reviewed: 05/24/2020 Elsevier Patient Education  2024 [employer].

## 2023-09-24 NOTE — Progress Notes (Addendum)
 Subjective:  Please attest and cosign this visit due to patients primary care provider not being in the office at the time the visit was completed.  (Pt of Dr Almarie Cleveland)   Joan [name] is a 63 y.o. who presents for a Medicare Wellness preventive visit.  As a reminder, Annual Wellness Visits don't include a physical exam, and some assessments may be limited, especially if this visit is performed virtually. We may recommend an in-person follow-up visit with your provider if needed.  Visit Complete: In person  Persons Participating in Visit: Patient.  AWV Questionnaire: No: Patient Medicare AWV questionnaire was not completed prior to this visit.  Cardiac Risk Factors include: advanced age (>69men, >30 women);diabetes mellitus;dyslipidemia;hypertension;obesity (BMI >30kg/m2)     Objective:    Today's Vitals   09/24/23 1412  BP: 132/80  Pulse: 82  SpO2: 97%  Weight: 211 lb 3.2 oz (95.8 kg)  Height: 5' 6 (1.676 m)   Body mass index is 34.09 kg/m.     09/24/2023    2:11 PM 08/14/2023    1:26 PM 08/14/2023    9:38 AM 07/28/2023    2:44 PM 06/17/2023    3:45 PM 01/29/2023    1:54 PM 12/05/2022    7:44 PM  Advanced Directives  Does Patient Have a Medical Advance Directive? Yes Yes Yes Yes  No No  Type of Estate agent of [place];Living will Healthcare Power of [place];Living will Healthcare Power of [place];Living will Healthcare Power of [place];Living will     Does patient want to make changes to medical advance directive? No - Patient declined No - Patient declined No - Patient declined No - Patient declined     Copy of Healthcare Power of Attorney in Chart? Yes - validated most recent copy scanned in chart (See row information) Yes - validated most recent copy scanned in chart (See row information) Yes - validated most recent copy scanned in chart (See row information) Yes - validated most recent copy scanned in chart (See row information)      Would patient like information on creating a medical advance directive?      No - Patient declined No - Patient declined     Information is confidential and restricted. Go to Review Flowsheets to unlock data.    Current Medications (verified) Outpatient Encounter Medications as of 09/24/2023  Medication Sig   ACCU-CHEK GUIDE TEST test strip USE TO CHECK BLOOD SUGAR IN THE  MORNING AT NOON AND AT BEDTIME   albuterol  (PROVENTIL ) (2.5 MG/3ML) 0.083% nebulizer solution Take 2.5 mg by nebulization 2 (two) times daily.   albuterol  (VENTOLIN  HFA) 108 (90 Base) MCG/ACT inhaler Inhale 2 puffs into the lungs every 6 (six) hours as needed for wheezing or shortness of breath.   atenolol  (TENORMIN ) 25 MG tablet TAKE 1 TABLET BY MOUTH DAILY   atorvastatin  (LIPITOR) 20 MG tablet TAKE 1 TABLET BY MOUTH ONCE  DAILY   Budeson-Glycopyrrol-Formoterol  (BREZTRI  AEROSPHERE) 160-9-4.8 MCG/ACT AERO Inhale 2 puffs into the lungs 2 (two) times daily.   buPROPion  (WELLBUTRIN  XL) 300 MG 24 hr tablet TAKE 1 TABLET BY MOUTH DAILY   busPIRone  (BUSPAR ) 5 MG tablet Take 5 mg by mouth 2 (two) times daily.   diazepam  (VALIUM ) 5 MG tablet Take 1 tablet (5 mg total) by mouth every 6 (six) hours as needed for anxiety.   fluticasone  (FLONASE ) 50 MCG/ACT nasal spray Place 2 sprays into both nostrils daily as needed for allergies.   furosemide  (LASIX )  20 MG tablet Take 1 tablet (20 mg total) by mouth daily.   guaiFENesin -dextromethorphan  (ROBITUSSIN DM) 100-10 MG/5ML syrup Take 10 mLs by mouth every 8 (eight) hours.   ipratropium-albuterol  (DUONEB) 0.5-2.5 (3) MG/3ML SOLN Take 3 mLs by nebulization every 4 (four) hours as needed (shortness of breath).   levothyroxine  (SYNTHROID ) 25 MCG tablet TAKE 1 TABLET BY MOUTH DAILY  BEFORE BREAKFAST   MELATONIN PO Take 10 mg by mouth at bedtime as needed (sleep).   metformin  (FORTAMET ) 1000 MG (OSM) 24 hr tablet Take 1 tablet (1,000 mg total) by mouth daily with breakfast.   montelukast   (SINGULAIR ) 10 MG tablet TAKE 1 TABLET BY MOUTH AT  BEDTIME   oxyCODONE -acetaminophen  (PERCOCET/ROXICET) 5-325 MG tablet Take 1-2 tablets by mouth every 6 (six) hours as needed for severe pain (pain score 7-10).   OXYGEN  Inhale 3.5 L/min into the lungs continuous. Inhale  3 L/min of oxygen  into the lungs w/CPAP at bedtime and as needed for shortness of breath with strenuous activity during the day   pantoprazole  (PROTONIX ) 40 MG tablet TAKE 1 TABLET BY MOUTH DAILY   PATADAY  0.2 % SOLN Apply 1 drop to eye daily.   polyethylene glycol (MIRALAX  / GLYCOLAX ) 17 g packet Take 17 g by mouth daily as needed for mild constipation or moderate constipation.   pregabalin  (LYRICA ) 300 MG capsule Take 1 capsule (300 mg total) by mouth 2 (two) times daily. Take 2 tablets (600 mg) daily   QUEtiapine  (SEROQUEL ) 100 MG tablet TAKE 1 TABLET BY MOUTH AT  BEDTIME   roflumilast  (DALIRESP ) 500 MCG TABS tablet Take 1 tablet (500 mcg total) by mouth daily.   tamsulosin  (FLOMAX ) 0.4 MG CAPS capsule Take 1 capsule (0.4 mg total) by mouth daily.   venlafaxine  XR (EFFEXOR -XR) 150 MG 24 hr capsule TAKE 1 CAPSULE BY MOUTH DAILY  WITH BREAKFAST   Wound Dressings (DRS CHOICE DIABETIC BANDAGES) KIT Apply 8 kits topically 3 (three) times daily before meals.   traZODone  (DESYREL ) 100 MG tablet Take 100 mg by mouth at bedtime. (Patient not taking: Reported on 09/24/2023)   No facility-administered encounter medications on file as of 09/24/2023.    Allergies (verified) Keflex [cephalexin], Shellfish allergy, Tetracyclines & related, Dilaudid  [hydromorphone  hcl], Prednisone , Clindamycin /lincomycin, Incruse ellipta  [umeclidinium bromide ], Molds & smuts, Tuberculin tests, and Umeclidinium   History: Past Medical History:  Diagnosis Date   Acute respiratory failure with hypoxia (HCC)    Alcoholism and drug addiction in family    Anxiety    Appendicitis    Arthritis    knees, hips, lower back (09/25/2016)   Asthma    Bipolar  disorder (HCC)    Chronic bronchitis (HCC)    all the time (09/25/2016)   Chronic leg pain    RLE   Chronic lower back pain    COPD (chronic obstructive pulmonary disease) (HCC)    Depression    Diabetes mellitus without complication (HCC)    Diabetic neuropathy (HCC)    Early cataracts, bilateral    Emphysema of lung (HCC)    Generalized anxiety disorder 02/09/2020   GERD (gastroesophageal reflux disease)    Headache    Hepatitis C    treated back in 2001-2002   High cholesterol    Hypertension    Hypothyroidism    Incarcerated ventral hernia 04/04/2017   Mood disorder in conditions classified elsewhere 02/09/2020   On home oxygen  therapy    2L; at nighttime (04/04/2017)   OSA on CPAP    CPAP  with Oxygen  3 liters   Paranoid (HCC)    Pneumonia    all the time (09/25/2016)   Positive PPD    with negative chest x ray   PTSD (post-traumatic stress disorder) 02/09/2020   Tachycardia    patient reports with exertion ; denies any other conditions    Thyroid  disease    Wears glasses    Wears partial dentures    Past Surgical History:  Procedure Laterality Date   ANTERIOR CERVICAL DECOMP/DISCECTOMY FUSION N/A 02/14/2022   Procedure: Anterior Cervical Decompression/Discectomy Fusion Cervical Three-Cervical Four;  Surgeon: Debby Dorn MATSU, MD;  Location: [facility];  Service: Neurosurgery;  Laterality: N/A;  3C   APPENDECTOMY     BACK SURGERY     BIOPSY  11/24/2018   Procedure: BIOPSY;  Surgeon: Eda Iha, MD;  Location: [facility] ENDOSCOPY;  Service: Gastroenterology;;   CARDIAC CATHETERIZATION     COLONOSCOPY     COLONOSCOPY WITH PROPOFOL  N/A 11/24/2018   Procedure: COLONOSCOPY WITH PROPOFOL ;  Surgeon: Eda Iha, MD;  Location: [facility];  Service: Gastroenterology;  Laterality: N/A;   CYST EXCISION     removal of cyst in sinuses   DILATION AND CURETTAGE OF UTERUS     ESOPHAGOGASTRODUODENOSCOPY (EGD) WITH PROPOFOL  N/A 11/24/2018   Procedure:  ESOPHAGOGASTRODUODENOSCOPY (EGD) WITH PROPOFOL ;  Surgeon: Eda Iha, MD;  Location: [facility];  Service: Gastroenterology;  Laterality: N/A;   FOOT SURGERY Bilateral    bone removed from 4th and 5 th toes and put in pin then took pin out   HERNIA REPAIR     INCISION AND DRAINAGE  ~ 2014/2015   removed mesh & infection   INCISIONAL HERNIA REPAIR N/A 04/04/2017   Procedure: LAPAROSCOPIC INCISIONAL HERNIA REPAIR WITH MESH;  Surgeon: Gail Favorite, MD;  Location: [facility] OR;  Service: General;  Laterality: N/A;   LACERATION REPAIR Right    repair wrist artery from laceration from ice skate   LAPAROSCOPIC APPENDECTOMY N/A 03/31/2019   Procedure: APPENDECTOMY LAPAROSCOPIC;  Surgeon: Rubin Calamity, MD;  Location: [facility]S;  Service: General;  Laterality: N/A;   LAPAROSCOPIC APPENDECTOMY N/A 07/09/2019   Procedure: APPENDECTOMY LAPAROSCOPIC;  Surgeon: Rubin Calamity, MD;  Location[facility] OR;  Service: General;  Laterality: N/A;   LAPAROSCOPIC INCISIONAL / UMBILICAL / VENTRAL HERNIA REPAIR  04/04/2017   LAPAROSCOPIC INCISIONAL HERNIA REPAIR WITH MESH   LIPOMA EXCISION Right    fattty lipoma in neck   NASAL SEPTUM SURGERY     POSTERIOR LUMBAR FUSION  2015/2016 X 2   added rods and screws   SINOSCOPY     TOTAL HIP ARTHROPLASTY Right 02/05/2017   Procedure: RIGHT TOTAL HIP ARTHROPLASTY;  Surgeon: Heide Ingle, MD;  Location: [facility];  Service: Orthopedics;  Laterality: Right;   TOTAL HIP ARTHROPLASTY Left 06/18/2017   Procedure: LEFT TOTAL HIP ARTHROPLASTY;  Surgeon: Heide Ingle, MD;  Location: [facility];  Service: Orthopedics;  Laterality: Left;   TOTAL HIP REVISION Left 01/26/2019   Procedure: TOTAL HIP REVISION;  Surgeon: Ernie Cough, MD;  Location: [facility];  Service: Orthopedics;  Laterality: Left;  2 hrs   TUBAL LIGATION     UMBILICAL HERNIA REPAIR  ~ 2013   w/mesh   UPPER GI ENDOSCOPY     Family History  Problem Relation Age of Onset   Diabetes Mother    Hypertension  Mother    Hypertension Father    Cancer Father        lung   Breast cancer Neg Hx  Social History   Socioeconomic History   Marital status: Widowed    Spouse name: Not on file   Number of children: 3   Years of education: Not on file   Highest education level: 9th grade  Occupational History   Occupation: RETIRED  Tobacco Use   Smoking status: Former    Current packs/day: 2.00    Average packs/day: 2.0 packs/day for 48.6 years (97.2 ttl pk-yrs)    Types: Cigarettes    Start date: 1977    Passive exposure: Current   Smokeless tobacco: Never   Tobacco comments:    1ppd as of 07/30/22 ep; Not ready to quit  Vaping Use   Vaping status: Never Used  Substance and Sexual Activity   Alcohol use: Not Currently   Drug use: Not Currently    Comment:  nothing since 01/12/2012   Sexual activity: Not Currently    Partners: Male  Other Topics Concern   Not on file  Social History Narrative   Lives alone/2025; widowed   Social Drivers of Health   Financial Resource Strain: Low Risk  (09/24/2023)   Overall Financial Resource Strain (CARDIA)    Difficulty of Paying Living Expenses: Not hard at all  Recent Concern: Financial Resource Strain - Medium Risk (07/28/2023)   Overall Financial Resource Strain (CARDIA)    Difficulty of Paying Living Expenses: Somewhat hard  Food Insecurity: No Food Insecurity (09/24/2023)   Hunger Vital Sign    Worried About Running Out of Food in the Last Year: Never true    Ran Out of Food in the Last Year: Never true  Transportation Needs: No Transportation Needs (09/24/2023)   PRAPARE - Administrator, Civil Service (Medical): No    Lack of Transportation (Non-Medical): No  Physical Activity: Inactive (09/24/2023)   Exercise Vital Sign    Days of Exercise per Week: 0 days    Minutes of Exercise per Session: 0 min  Stress: Stress Concern Present (09/24/2023)   [employer] of Occupational Health - Occupational Stress Questionnaire     Feeling of Stress: To some extent  Social Connections: Socially Isolated (09/24/2023)   Social Connection and Isolation Panel    Frequency of Communication with Friends and Family: More than three times a week    Frequency of Social Gatherings with Friends and Family: Three times a week    Attends Religious Services: Never    Active Member of Clubs or Organizations: No    Attends Banker Meetings: Never    Marital Status: Widowed    Tobacco Counseling Counseling given: No Tobacco comments: 1ppd as of 07/30/22 ep; Not ready to quit    Clinical Intake:  Pre-visit preparation completed: Yes  Pain : No/denies pain     BMI - recorded: 34.09 Nutritional Status: BMI > 30  Obese Nutritional Risks: None Diabetes: Yes CBG done?: No Did pt. bring in CBG monitor from home?: No  Lab Results  Component Value Date   HGBA1C 6.3 (H) 08/15/2023   HGBA1C 6.8 (H) 09/08/2022   HGBA1C 7.0 (H) 02/08/2022     How often do you need to have someone help you when you read instructions, pamphlets, or other written materials from your doctor or pharmacy?: 1 - Never  Interpreter Needed?: No  Information entered by :: Verdie Saba, CMA   Activities of Daily Living     09/24/2023    2:18 PM 08/14/2023    1:26 PM  In your present state of health,  do you have any difficulty performing the following activities:  Hearing? 0 0  Vision? 0 0  Difficulty concentrating or making decisions? 0 0  Walking or climbing stairs? 0   Dressing or bathing? 0   Doing errands, shopping? 0 1  Preparing Food and eating ? N   Using the Toilet? N   In the past six months, have you accidently leaked urine? N   Do you have problems with loss of bowel control? N   Managing your Medications? N   Managing your Finances? N   Housekeeping or managing your Housekeeping? N     Patient Care Team: Rollene Almarie LABOR, MD as PCP - General (Internal Medicine) Court Dorn PARAS, MD as PCP - Cardiology  (Cardiology) Eda Iha, MD (Inactive) as Consulting Physician (Gastroenterology) Jacques Sharper, MD as Consulting Physician (Ophthalmology) Ernie Cough, MD as Consulting Physician (Orthopedic Surgery) Szabat, Toribio BROCKS, [facility] (Inactive) (Pharmacist)  I have updated your Care Teams any recent Medical Services you may have received from other providers in the past year.     Assessment:   This is a routine wellness examination for Ama.  Hearing/Vision screen Hearing Screening - Comments:: Denies hearing difficulties   Vision Screening - Comments:: Wears rx glasses -needs an appt   Goals Addressed               This Visit's Progress     Patient Stated (pt-stated)        Patient stated she plans to stay active       Depression Screen     09/24/2023    2:54 PM 07/28/2023    2:49 PM 08/30/2022   12:30 PM 08/30/2022   11:15 AM 08/13/2022    2:51 PM 10/12/2021    2:45 PM 08/27/2021    1:42 PM  PHQ 2/9 Scores  PHQ - 2 Score 2 4 0 0 0 2 0  PHQ- 9 Score 6 14    2      Fall Risk     09/24/2023    2:19 PM 07/28/2023    2:44 PM 12/02/2022   11:24 AM 08/30/2022   12:24 PM 08/30/2022   11:15 AM  Fall Risk   Falls in the past year? 1 1 0 1 1  Number falls in past yr: 1 1 0 0 0  Comment >3      Injury with Fall? 0 0 0 0 0  Risk for fall due to : History of fall(s);Impaired balance/gait Impaired balance/gait;Medication side effect  History of fall(s) History of fall(s)  Follow up Falls evaluation completed;Falls prevention discussed Falls evaluation completed;Falls prevention discussed  Falls evaluation completed Falls evaluation completed    MEDICARE RISK AT HOME:  Medicare Risk at Home Any stairs in or around the home?: No If so, are there any without handrails?: No Home free of loose throw rugs in walkways, pet beds, electrical cords, etc?: Yes Adequate lighting in your home to reduce risk of falls?: Yes Life alert?: No Use of a cane, walker or w/c?: Yes  (cane/walker/wheelchair/walker) Grab bars in the bathroom?: Yes Shower chair or bench in shower?: Yes Elevated toilet seat or a handicapped toilet?: Yes  TIMED UP AND GO:  Was the test performed?  No  Cognitive Function: 6CIT completed    04/06/2018    3:04 PM  MMSE - Mini Mental State Exam  Orientation to time 5  Orientation to Place 5  Registration 3  Attention/ Calculation 4  Recall 3  Language-  name 2 objects 2  Language- repeat 1  Language- follow 3 step command 3  Language- read & follow direction 1  Write a sentence 1  Copy design 1  Total score 29        09/24/2023    2:23 PM 08/30/2022   12:21 PM 08/27/2021    1:54 PM 05/24/2019    1:43 PM  6CIT Screen  What Year? 0 points 0 points 0 points 0 points  What month? 0 points 0 points 0 points 0 points  What time? 0 points 0 points 0 points 0 points  Count back from 20 0 points 0 points 0 points 0 points  Months in reverse 0 points 4 points  0 points  Repeat phrase 6 points 4 points 2 points 0 points  Total Score 6 points 8 points  0 points    Immunizations Immunization History  Administered Date(s) Administered   Influenza,inj,Quad PF,6+ Mos 11/25/2016, 11/11/2017, 11/09/2018, 10/28/2019, 03/15/2021, 11/21/2021   PFIZER(Purple Top)SARS-COV-2 Vaccination 06/04/2019, 06/28/2019   PNEUMOCOCCAL CONJUGATE-20 09/24/2023   Pfizer Covid-19 Vaccine Bivalent Booster 5yrs & up 04/05/2021   Tdap 12/17/2018    Screening Tests Health Maintenance  Topic Date Due   COVID-19 Vaccine (4 - 2024-25 season) 10/27/2022   Zoster Vaccines- Shingrix (1 of 2) 12/25/2023 (Originally 11/01/1979)   Cervical Cancer Screening (HPV/Pap Cotest)  09/23/2024 (Originally 05/11/2022)   INFLUENZA VACCINE  09/26/2023   FOOT EXAM  12/02/2023   OPHTHALMOLOGY EXAM  12/03/2023   HEMOGLOBIN A1C  02/14/2024   MAMMOGRAM  11/03/2024   Colonoscopy  11/23/2028   DTaP/Tdap/Td (2 - Td or Tdap) 12/16/2028   Pneumococcal Vaccine 87-42 Years old  Completed    Hepatitis C Screening  Completed   HIV Screening  Completed   Hepatitis B Vaccines  Aged Out   HPV VACCINES  Aged Out   Meningococcal B Vaccine  Aged Out   Lung Cancer Screening  Discontinued    Health Maintenance  Health Maintenance Due  Topic Date Due   COVID-19 Vaccine (4 - 2024-25 season) 10/27/2022   Health Maintenance Items Addressed:  Pneumovax vaccine given today  I have recommended that this patient have a pap smear and immunization for Shingles but she declines at this time. I have discussed the risks and benefits of this procedure with her. The patient verbalizes understanding.   Additional Screening:  Vision Screening: Recommended annual ophthalmology exams for early detection of glaucoma and other disorders of the eye. Would you like a referral to an eye doctor? Yes  - referral to [facility] in [place] for a Diabetic eye exam on 10/01/2023.  Dental Screening: Recommended annual dental exams for proper oral hygiene  Community Resource Referral / Chronic Care Management: CRR required this visit?  No   CCM required this visit?  No   Plan:    I have personally reviewed and noted the following in the patient's chart:   Medical and social history Use of alcohol, tobacco or illicit drugs  Current medications and supplements including opioid prescriptions. Patient is currently taking an opioid prescription. Functional ability and status Nutritional status Physical activity Advanced directives List of other physicians Hospitalizations, surgeries, and ER visits in previous 12 months Vitals Screenings to include cognitive, depression, and falls Referrals and appointments  In addition, I have reviewed and discussed with patient certain preventive protocols, quality metrics, and best practice recommendations. A written personalized care plan for preventive services as well as general preventive health recommendations were provided to patient.  Verdie CHRISTELLA Saba,  CMA   09/24/2023   After Visit Summary: (In Person-Declined) Patient declined AVS at this time.  Notes: Please refer to Routing Comments.Scheduled a Diabetic f/u appt w/PCP for 10/08/2023.

## 2023-09-25 ENCOUNTER — Encounter: Payer: Self-pay | Admitting: Acute Care

## 2023-10-07 ENCOUNTER — Ambulatory Visit: Admitting: Internal Medicine

## 2023-10-10 ENCOUNTER — Emergency Department (HOSPITAL_COMMUNITY)

## 2023-10-10 ENCOUNTER — Other Ambulatory Visit: Payer: Self-pay

## 2023-10-10 ENCOUNTER — Emergency Department (HOSPITAL_COMMUNITY)
Admission: EM | Admit: 2023-10-10 | Discharge: 2023-10-10 | Disposition: A | Discharge status: Home / Self Care | Attending: Emergency Medicine | Admitting: Emergency Medicine

## 2023-10-10 ENCOUNTER — Encounter (HOSPITAL_COMMUNITY): Payer: Self-pay

## 2023-10-10 DIAGNOSIS — J45909 Unspecified asthma, uncomplicated: Secondary | ICD-10-CM | POA: Diagnosis not present

## 2023-10-10 DIAGNOSIS — Z7984 Long term (current) use of oral hypoglycemic drugs: Secondary | ICD-10-CM | POA: Diagnosis not present

## 2023-10-10 DIAGNOSIS — E1140 Type 2 diabetes mellitus with diabetic neuropathy, unspecified: Secondary | ICD-10-CM | POA: Insufficient documentation

## 2023-10-10 DIAGNOSIS — E039 Hypothyroidism, unspecified: Secondary | ICD-10-CM | POA: Insufficient documentation

## 2023-10-10 DIAGNOSIS — S7002XA Contusion of left hip, initial encounter: Secondary | ICD-10-CM | POA: Diagnosis not present

## 2023-10-10 DIAGNOSIS — W19XXXA Unspecified fall, initial encounter: Secondary | ICD-10-CM

## 2023-10-10 DIAGNOSIS — I10 Essential (primary) hypertension: Secondary | ICD-10-CM | POA: Diagnosis not present

## 2023-10-10 DIAGNOSIS — J449 Chronic obstructive pulmonary disease, unspecified: Secondary | ICD-10-CM | POA: Insufficient documentation

## 2023-10-10 DIAGNOSIS — M25552 Pain in left hip: Secondary | ICD-10-CM | POA: Diagnosis present

## 2023-10-10 DIAGNOSIS — G8911 Acute pain due to trauma: Secondary | ICD-10-CM | POA: Diagnosis not present

## 2023-10-10 DIAGNOSIS — M79603 Pain in arm, unspecified: Secondary | ICD-10-CM | POA: Diagnosis not present

## 2023-10-10 DIAGNOSIS — W01198A Fall on same level from slipping, tripping and stumbling with subsequent striking against other object, initial encounter: Secondary | ICD-10-CM | POA: Insufficient documentation

## 2023-10-10 LAB — BASIC METABOLIC PANEL WITH GFR
Anion gap: 10 (ref 5–15)
BUN: 18 mg/dL (ref 8–23)
CO2: 29 mmol/L (ref 22–32)
Calcium: 8.5 mg/dL — ABNORMAL LOW (ref 8.9–10.3)
Chloride: 100 mmol/L (ref 98–111)
Creatinine, Ser: 1.12 mg/dL — ABNORMAL HIGH (ref 0.44–1.00)
GFR, Estimated: 56 mL/min — ABNORMAL LOW (ref >60)
Glucose, Bld: 139 mg/dL — ABNORMAL HIGH (ref 70–99)
Potassium: 4.3 mmol/L (ref 3.5–5.1)
Sodium: 139 mmol/L (ref 135–145)

## 2023-10-10 LAB — CBC
HCT: 33.9 % — ABNORMAL LOW (ref 36.0–46.0)
Hemoglobin: 10.3 g/dL — ABNORMAL LOW (ref 12.0–15.0)
MCH: 27.5 pg (ref 26.0–34.0)
MCHC: 30.4 g/dL (ref 30.0–36.0)
MCV: 90.4 fL (ref 80.0–100.0)
Platelets: 206 K/uL (ref 150–400)
RBC: 3.75 MIL/uL — ABNORMAL LOW (ref 3.87–5.11)
RDW: 15.4 % (ref 11.5–15.5)
WBC: 9.2 K/uL (ref 4.0–10.5)
nRBC: 0 % (ref 0.0–0.2)

## 2023-10-10 MED ORDER — SODIUM CHLORIDE 0.9 % IV BOLUS
500.0000 mL | Freq: Once | INTRAVENOUS | Status: AC
  Administered 2023-10-10: 500 mL via INTRAVENOUS

## 2023-10-10 NOTE — ED Triage Notes (Addendum)
 Pt BIB [facility] from a fall out of her power wheel chair she flipped on the curve. EMS noticed BP to be 80s systolic. 350 cc of NS given. Pt c/o lower back, L leg & arm pain.

## 2023-10-10 NOTE — ED Notes (Signed)
 Bruises noted to bilateral legs and arms, various stages of healing

## 2023-10-10 NOTE — ED Provider Notes (Signed)
 [place] [facility]ERGENCY DEPARTMENT AT [facility] Provider Note   CSN: [id number] Arrival date & time: 10/10/23  1451     Patient presents with: Joan [name] is a 63 y.o. female.    Fall   Patient with history of COPD.  Is in a wheelchair.  States that tripped backwards and fell backwards.  Complaining of pain really only left hip.  States she did hit her head.  Hypotensive per EMS with pressures in the 80s systolic.  Does not feel lightheaded or dizzy.  No chest pain.   Past Medical History:  Diagnosis Date   Acute respiratory failure with hypoxia (HCC)    Alcoholism and drug addiction in family    Anxiety    Appendicitis    Arthritis    knees, hips, lower back (09/25/2016)   Asthma    Bipolar disorder (HCC)    Chronic bronchitis (HCC)    all the time (09/25/2016)   Chronic leg pain    RLE   Chronic lower back pain    COPD (chronic obstructive pulmonary disease) (HCC)    Depression    Diabetes mellitus without complication (HCC)    Diabetic neuropathy (HCC)    Early cataracts, bilateral    Emphysema of lung (HCC)    Generalized anxiety disorder 02/09/2020   GERD (gastroesophageal reflux disease)    Headache    Hepatitis C    treated back in 2001-2002   High cholesterol    Hypertension    Hypothyroidism    Incarcerated ventral hernia 04/04/2017   Mood disorder in conditions classified elsewhere 02/09/2020   On home oxygen  therapy    2L; at nighttime (04/04/2017)   OSA on CPAP    CPAP with Oxygen  3 liters   Paranoid (HCC)    Pneumonia    all the time (09/25/2016)   Positive PPD    with negative chest x ray   PTSD (post-traumatic stress disorder) 02/09/2020   Tachycardia    patient reports with exertion ; denies any other conditions    Thyroid  disease    Wears glasses    Wears partial dentures     Prior to Admission medications   Medication Sig Start Date End Date Taking? Authorizing Provider  ACCU-CHEK GUIDE TEST test strip  USE TO CHECK BLOOD SUGAR IN THE  MORNING AT [address] [employer] AND AT BEDTIME 08/04/23   Rollene Almarie LABOR, MD  albuterol  (PROVENTIL ) (2.5 MG/3ML) 0.083% nebulizer solution Take 2.5 mg by nebulization 2 (two) times daily. 02/28/23   [provider]  albuterol  (VENTOLIN  HFA) 108 (90 Base) MCG/ACT inhaler Inhale 2 puffs into the lungs every 6 (six) hours as needed for wheezing or shortness of breath. 09/16/23   Ruthell Lauraine FALCON, NP  atenolol  (TENORMIN ) 25 MG tablet TAKE 1 TABLET BY MOUTH DAILY 05/15/23   Rollene Almarie LABOR, MD  atorvastatin  (LIPITOR) 20 MG tablet TAKE 1 TABLET BY MOUTH ONCE  DAILY 03/10/23   Rollene Almarie LABOR, MD  Budeson-Glycopyrrol-Formoterol  (BREZTRI  AEROSPHERE) 160-9-4.8 MCG/ACT AERO Inhale 2 puffs into the lungs 2 (two) times daily. 03/11/23   Jude Harden GAILS, MD  buPROPion  (WELLBUTRIN  XL) 300 MG 24 hr tablet TAKE 1 TABLET BY MOUTH DAILY 06/11/23   Geralene Kaiser, MD  busPIRone  (BUSPAR ) 5 MG tablet Take 5 mg by mouth 2 (two) times daily. 08/01/23   [provider]  diazepam  (VALIUM ) 5 MG tablet Take 1 tablet (5 mg total) by mouth every 6 (six) hours as needed for  anxiety. 12/07/22   Johnson, Clanford L, MD  fluticasone  (FLONASE ) 50 MCG/ACT nasal spray Place 2 sprays into both nostrils daily as needed for allergies. 12/07/22 09/24/23  Johnson, Clanford L, MD  furosemide  (LASIX ) 20 MG tablet Take 1 tablet (20 mg total) by mouth daily. 08/15/23   Johnson, Clanford L, MD  guaiFENesin -dextromethorphan  (ROBITUSSIN DM) 100-10 MG/5ML syrup Take 10 mLs by mouth every 8 (eight) hours. 02/02/23   Shahmehdi, Adriana LABOR, MD  ipratropium-albuterol  (DUONEB) 0.5-2.5 (3) MG/3ML SOLN Take 3 mLs by nebulization every 4 (four) hours as needed (shortness of breath). 05/20/23   [provider]  levothyroxine  (SYNTHROID ) 25 MCG tablet TAKE 1 TABLET BY MOUTH DAILY  BEFORE BREAKFAST 08/14/23   Rollene Almarie LABOR, MD  MELATONIN PO Take 10 mg by mouth at bedtime as needed (sleep).    [provider]  metformin  (FORTAMET ) 1000 MG (OSM) 24 hr tablet Take 1 tablet (1,000 mg total) by mouth daily with breakfast. 02/02/23 09/24/23  Shahmehdi, Adriana LABOR, MD  montelukast  (SINGULAIR ) 10 MG tablet TAKE 1 TABLET BY MOUTH AT  BEDTIME 03/08/23   Jude Harden GAILS, MD  oxyCODONE -acetaminophen  (PERCOCET/ROXICET) 5-325 MG tablet Take 1-2 tablets by mouth every 6 (six) hours as needed for severe pain (pain score 7-10).    [provider]  OXYGEN  Inhale 3.5 L/min into the lungs continuous. Inhale  3 L/min of oxygen  into the lungs w/CPAP at bedtime and as needed for shortness of breath with strenuous activity during the day    [provider]  pantoprazole  (PROTONIX ) 40 MG tablet TAKE 1 TABLET BY MOUTH DAILY 08/13/23   Rollene Almarie LABOR, MD  PATADAY  0.2 % SOLN Apply 1 drop to eye daily. 05/16/23   [provider]  polyethylene glycol (MIRALAX  / GLYCOLAX ) 17 g packet Take 17 g by mouth daily as needed for mild constipation or moderate constipation.    [provider]  pregabalin  (LYRICA ) 300 MG capsule Take 1 capsule (300 mg total) by mouth 2 (two) times daily. Take 2 tablets (600 mg) daily 08/15/23   Johnson, Clanford L, MD  QUEtiapine  (SEROQUEL ) 100 MG tablet TAKE 1 TABLET BY MOUTH AT  BEDTIME 09/12/23   Geralene Kaiser, MD  roflumilast  (DALIRESP ) 500 MCG TABS tablet Take 1 tablet (500 mcg total) by mouth daily. 04/02/22   Ruthell Lauraine FALCON, NP  tamsulosin  (FLOMAX ) 0.4 MG CAPS capsule Take 1 capsule (0.4 mg total) by mouth daily. 08/15/23   Johnson, Clanford L, MD  traZODone  (DESYREL ) 100 MG tablet Take 100 mg by mouth at bedtime. Patient not taking: Reported on 09/24/2023    [provider]  venlafaxine  XR (EFFEXOR -XR) 150 MG 24 hr capsule TAKE 1 CAPSULE BY MOUTH DAILY  WITH BREAKFAST 08/13/23   Rollene Almarie LABOR, MD  Wound Dressings (DRS CHOICE DIABETIC BANDAGES) KIT Apply 8 kits topically 3 (three) times daily before meals. 02/02/23   Willette Adriana LABOR, MD     Allergies: Keflex [cephalexin], Shellfish allergy, Tetracyclines & related, Dilaudid  [hydromorphone  hcl], Prednisone , Clindamycin /lincomycin, Incruse ellipta  [umeclidinium bromide ], Molds & smuts, Tuberculin tests, and Umeclidinium    Review of Systems  Updated Vital Signs BP 126/63   Pulse 64   Temp 98.3 F (36.8 C) (Oral)   Resp 16   Ht 5' 6 (1.676 m)   Wt 95.8 kg   SpO2 96%   BMI 34.09 kg/m   Physical Exam Vitals and nursing note reviewed.  HENT:     Head:     Comments: Tenderness  to occipital area on the right and upper cervical spine. Cardiovascular:     Rate and Rhythm: Regular rhythm.  Pulmonary:     Breath sounds: No wheezing.  Abdominal:     Tenderness: There is no abdominal tenderness.  Musculoskeletal:        General: Tenderness present.     Cervical back: Neck supple.     Comments: Some tenderness from left hip into the left pelvis.  No abdominal tenderness.  No other extremity tenderness.  Does have some chronic bruises on bilateral forearms.  No lumbar or posterior pelvis tenderness.  Neurological:     Mental Status: She is alert and oriented to person, place, and time.     (all labs ordered are listed, but only abnormal results are displayed) Labs Reviewed  CBC - Abnormal; Notable for the following components:      Result Value   RBC 3.75 (*)    Hemoglobin 10.3 (*)    HCT 33.9 (*)    All other components within normal limits  BASIC METABOLIC PANEL WITH GFR - Abnormal; Notable for the following components:   Glucose, Bld 139 (*)    Creatinine, Ser 1.12 (*)    Calcium  8.5 (*)    GFR, Estimated 56 (*)    All other components within normal limits  TSH  RAPID URINE DRUG SCREEN, HOSP PERFORMED    EKG: None  Radiology: CT Cervical Spine Wo Contrast Result Date: 10/10/2023 CLINICAL DATA:  Status post trauma. EXAM: CT CERVICAL SPINE WITHOUT CONTRAST TECHNIQUE: Multidetector CT imaging of the cervical spine was performed without intravenous  contrast. Multiplanar CT image reconstructions were also generated. RADIATION DOSE REDUCTION: This exam was performed according to the departmental dose-optimization program which includes automated exposure control, adjustment of the mA and/or kV according to patient size and/or use of iterative reconstruction technique. COMPARISON:  December 05, 2022 FINDINGS: Alignment: There is straightening of the normal cervical spine lordosis. Skull base and vertebrae: No acute fracture. No primary bone lesion or focal pathologic process. A metallic density fusion plate and screws are seen along the anterior aspects of the C3 and C4 vertebral bodies. Soft tissues and spinal canal: No prevertebral fluid or swelling. No visible canal hematoma. Disc levels: Moderate to marked severity endplate sclerosis, anterior osteophyte formation posterior bony spurring is seen at the level of C6-C7 with marked severity anterior osteophyte formation also present at the levels of C4-C5 and C5-C6. Anterior cervical fusion of the C3-C4 level is seen with metallic density operative material present within the C3-C4 intervertebral disc space. There is marked severity intervertebral disc space narrowing at the level of C6-C7. Bilateral marked severity multilevel facet joint hypertrophy is noted. Upper chest: Negative. Other: None. IMPRESSION: 1. No acute fracture or subluxation of the cervical spine. 2. Anterior cervical fusion of the C3-C4 level. 3. Marked severity multilevel degenerative changes, most prominent at the level of C6-C7. Electronically Signed   By: Suzen Dials M.D.   On: 10/10/2023 18:09   CT Head Wo Contrast Result Date: 10/10/2023 CLINICAL DATA:  Status post trauma. EXAM: CT HEAD WITHOUT CONTRAST TECHNIQUE: Contiguous axial images were obtained from the base of the skull through the vertex without intravenous contrast. RADIATION DOSE REDUCTION: This exam was performed according to the departmental dose-optimization program  which includes automated exposure control, adjustment of the mA and/or kV according to patient size and/or use of iterative reconstruction technique. COMPARISON:  December 05, 2022 FINDINGS: Brain: There is generalized cerebral atrophy with widening of  the extra-axial spaces and ventricular dilatation. There are areas of decreased attenuation within the white matter tracts of the supratentorial brain, consistent with microvascular disease changes. Vascular: No hyperdense vessel or unexpected calcification. Skull: Normal. Negative for fracture or focal lesion. Sinuses/Orbits: Mild left maxillary sinus mucosal thickening is seen. Other: None. IMPRESSION: 1. Generalized cerebral atrophy and microvascular disease changes of the supratentorial brain. 2. No acute intracranial abnormality. Electronically Signed   By: Suzen Dials M.D.   On: 10/10/2023 18:06   DG Hip Unilat W or Wo Pelvis 2-3 Views Left Result Date: 10/10/2023 CLINICAL DATA:  Status post fall. EXAM: DG HIP (WITH OR WITHOUT PELVIS) 2-3V LEFT COMPARISON:  None Available. FINDINGS: Intact bilateral total hip replacements are seen. There is no evidence of an acute fracture or dislocation. Postoperative changes are seen within the lower lumbar spine. IMPRESSION: Intact bilateral total hip replacements. Electronically Signed   By: Suzen Dials M.D.   On: 10/10/2023 17:22     Procedures   Medications Ordered in the ED  sodium chloride  0.9 % bolus 500 mL (0 mLs Intravenous Stopped 10/10/23 1824)                                    Medical Decision Making Amount and/or Complexity of Data Reviewed Labs: ordered. Radiology: ordered.   Patient with fall.  Consult mechanical was found to be hypotensive.  Will get head CT and cervical spine.  Will get left hip x-ray.  Will get basic blood work also for the hypotension.  Not feeling more short of breath.  Workup reassuring.  Feeling better.     Final diagnoses:  Fall, initial encounter   Contusion of left hip, initial encounter    ED Discharge Orders     None          Patsey Lot, MD 10/10/23 2348

## 2023-10-11 LAB — TSH: TSH: 2.187 u[IU]/mL (ref 0.350–4.500)

## 2023-10-13 ENCOUNTER — Telehealth (HOSPITAL_COMMUNITY): Payer: Self-pay | Admitting: Psychiatry

## 2023-10-13 NOTE — Telephone Encounter (Signed)
 Patient called requesting virtual appointment in collaboration with her hospice nurse Elenor who she placed on the phone. Nurse states patient has been increasingly confused, getting her dates and times mixed up, paranoid, thinks there is something living in her recliner. This has been going on 2 or more weeks. Recent fall but did not strike her head and was evaluated at the ER. Nurse will be with patient at the time of the virtual visit on 10/15/2023.

## 2023-10-15 ENCOUNTER — Telehealth (INDEPENDENT_AMBULATORY_CARE_PROVIDER_SITE_OTHER): Admitting: Psychiatry

## 2023-10-15 ENCOUNTER — Encounter (HOSPITAL_COMMUNITY): Payer: Self-pay | Admitting: Psychiatry

## 2023-10-15 DIAGNOSIS — F411 Generalized anxiety disorder: Secondary | ICD-10-CM | POA: Diagnosis not present

## 2023-10-15 DIAGNOSIS — R443 Hallucinations, unspecified: Secondary | ICD-10-CM | POA: Diagnosis not present

## 2023-10-15 DIAGNOSIS — F39 Unspecified mood [affective] disorder: Secondary | ICD-10-CM | POA: Diagnosis not present

## 2023-10-15 DIAGNOSIS — F0630 Mood disorder due to known physiological condition, unspecified: Secondary | ICD-10-CM

## 2023-10-15 MED ORDER — BUSPIRONE HCL 5 MG PO TABS: 5.0000 mg | ORAL_TABLET | Freq: Every day | ORAL | 0 refills | Status: AC

## 2023-10-15 MED ORDER — QUETIAPINE FUMARATE 150 MG PO TABS: 150.0000 mg | ORAL_TABLET | Freq: Every day | ORAL | 0 refills | Status: DC

## 2023-10-15 NOTE — Progress Notes (Signed)
 [facility] Follow up visit  Patient Identification: Joan [name] MRN:  [id number] Date of Evaluation:  10/15/2023 Referral Source:Therapist Ronal Sink Chief Complaint:     depression follow up , early follow up  I see rats Visit Diagnosis:    ICD-10-CM   1. Mood disorder in conditions classified elsewhere  F06.30     2. GAD (generalized anxiety disorder)  F41.1     3. Hallucinations  R44.3      Virtual Visit via Video Note  I connected with Joan [name] on 10/15/23 at  9:20 AM EDT by a video enabled telemedicine application and verified that I am speaking with the correct person using two identifiers.  Location: Patient: home with hospice nurse and family Provider: home office   I discussed the limitations of evaluation and management by telemedicine and the availability of in person appointments. The patient expressed understanding and agreed to proceed.    I discussed the assessment and treatment plan with the patient. The patient was provided an opportunity to ask questions and all were answered. The patient agreed with the plan and demonstrated an understanding of the instructions.   The patient was advised to call back or seek an in-person evaluation if the symptoms worsen or if the condition fails to improve as anticipated.  I provided 25 minutes of non-face-to-face time during this encounter.    History of Present Illness: Patient is a 63 years old currently widow Caucasian female initially referred by her therapist for management of mood symptoms, depression possible PTSD and anxiety  Synopsis: Patient has been in rehab and following with providers in regard to Coronary health and COPD.  She has history of hallucination including seeing ghost   Last visit she did not want to cut down any medication This is a early visit because of family and nurse wanted her to be seen.  Patient is presenting with feeling that there are rats she states that I know there is  some they do not believe me she is also seeing shadows or people outside at night and have to call police.  Patient is having irregular sleep during the daytime she sleeps or take naps or fall asleep and during the nighttime it is irregular to consolidated sleep  Her medication losing Seroquel  is 100 mg we talked about increasing it because of her hallucination also closely follow-up with her medical providers for her medical comorbidity including COPD she has had a history of fall and confusion with hypotension with ER visit as well  Family is explained about her situation we will work on the medication regarding to her if there is some delusion related to her underlying mental health and we will increase the medication Seroquel  from 0100-0150 also patient to consolidate her sleep during the nighttime we will also reduce BuSpar  from 2 times a day to 1 a day.  Patient is not willing to cut down Valium    Aggravating factor; difficult childhood, grief,difficult relationship with middle kid, hip replacement medical comorbidity, loneliness Modifying factor: brother, family  Duration adult life Severity ; manageable     Past Psychiatric History: depression, drug use  Previous Psychotropic Medications: Yes   Substance Abuse History in the last 12 months:  No.  Consequences of Substance Abuse: history of extensive drug use uptil 8 years ago and have been in rehab programs  Past Medical History:  Past Medical History:  Diagnosis Date   Acute respiratory failure with hypoxia (HCC)    Alcoholism and drug  addiction in family    Anxiety    Appendicitis    Arthritis    knees, hips, lower back (09/25/2016)   Asthma    Bipolar disorder (HCC)    Chronic bronchitis (HCC)    all the time (09/25/2016)   Chronic leg pain    RLE   Chronic lower back pain    COPD (chronic obstructive pulmonary disease) (HCC)    Depression    Diabetes mellitus without complication (HCC)    Diabetic neuropathy  (HCC)    Early cataracts, bilateral    Emphysema of lung (HCC)    Generalized anxiety disorder 02/09/2020   GERD (gastroesophageal reflux disease)    Headache    Hepatitis C    treated back in 2001-2002   High cholesterol    Hypertension    Hypothyroidism    Incarcerated ventral hernia 04/04/2017   Mood disorder in conditions classified elsewhere 02/09/2020   On home oxygen  therapy    2L; at nighttime (04/04/2017)   OSA on CPAP    CPAP with Oxygen  3 liters   Paranoid (HCC)    Pneumonia    all the time (09/25/2016)   Positive PPD    with negative chest x ray   PTSD (post-traumatic stress disorder) 02/09/2020   Tachycardia    patient reports with exertion ; denies any other conditions    Thyroid  disease    Wears glasses    Wears partial dentures     Past Surgical History:  Procedure Laterality Date   ANTERIOR CERVICAL DECOMP/DISCECTOMY FUSION N/A 02/14/2022   Procedure: Anterior Cervical Decompression/Discectomy Fusion Cervical Three-Cervical Four;  Surgeon: Debby Dorn MATSU, MD;  Location: [facility] OR;  Service: Neurosurgery;  Laterality: N/A;  3C   APPENDECTOMY     BACK SURGERY     BIOPSY  11/24/2018   Procedure: BIOPSY;  Surgeon: Eda Iha, MD;  Location: [facility];  Service: Gastroenterology;;   CARDIAC CATHETERIZATION     COLONOSCOPY     COLONOSCOPY WITH PROPOFOL  N/A 11/24/2018   Procedure: COLONOSCOPY WITH PROPOFOL ;  Surgeon: Eda Iha, MD;  Location: [facility];  Service: Gastroenterology;  Laterality: N/A;   CYST EXCISION     removal of cyst in sinuses   DILATION AND CURETTAGE OF UTERUS     ESOPHAGOGASTRODUODENOSCOPY (EGD) WITH PROPOFOL  N/A 11/24/2018   Procedure: ESOPHAGOGASTRODUODENOSCOPY (EGD) WITH PROPOFOL ;  Surgeon: Eda Iha, MD;  Location: [facility];  Service: Gastroenterology;  Laterality: N/A;   FOOT SURGERY Bilateral    bone removed from 4th and 5 th toes and put in pin then took pin out   HERNIA REPAIR     INCISION AND  DRAINAGE  ~ 2014/2015   removed mesh & infection   INCISIONAL HERNIA REPAIR N/A 04/04/2017   Procedure: LAPAROSCOPIC INCISIONAL HERNIA REPAIR WITH MESH;  Surgeon: Gail Favorite, MD;  Location: [facility] OR;  Service: General;  Laterality: N/A;   LACERATION REPAIR Right    repair wrist artery from laceration from ice skate   LAPAROSCOPIC APPENDECTOMY N/A 03/31/2019   Procedure: APPENDECTOMY LAPAROSCOPIC;  Surgeon: Rubin Calamity, MD;  Location: [facility];  Service: General;  Laterality: N/A;   LAPAROSCOPIC APPENDECTOMY N/A 07/09/2019   Procedure: APPENDECTOMY LAPAROSCOPIC;  Surgeon: Rubin Calamity, MD;  Location: [facility] OR;  Service: General;  Laterality: N/A;   LAPAROSCOPIC INCISIONAL / UMBILICAL / VENTRAL HERNIA REPAIR  04/04/2017   LAPAROSCOPIC INCISIONAL HERNIA REPAIR WITH MESH   LIPOMA EXCISION Right    fattty lipoma in neck   NASAL SEPTUM SURGERY  POSTERIOR LUMBAR FUSION  2015/2016 X 2   added rods and screws   SINOSCOPY     TOTAL HIP ARTHROPLASTY Right 02/05/2017   Procedure: RIGHT TOTAL HIP ARTHROPLASTY;  Surgeon: Heide Ingle, MD;  Location: [facility];  Service: Orthopedics;  Laterality: Right;   TOTAL HIP ARTHROPLASTY Left 06/18/2017   Procedure: LEFT TOTAL HIP ARTHROPLASTY;  Surgeon: Heide Ingle, MD;  Location: [facility];  Service: Orthopedics;  Laterality: Left;   TOTAL HIP REVISION Left 01/26/2019   Procedure: TOTAL HIP REVISION;  Surgeon: Ernie Cough, MD;  Location: [facility];  Service: Orthopedics;  Laterality: Left;  2 hrs   TUBAL LIGATION     UMBILICAL HERNIA REPAIR  ~ 2013   w/mesh   UPPER GI ENDOSCOPY      Family Psychiatric History: FAther : alcohol use  Family History:  Family History  Problem Relation Age of Onset   Diabetes Mother    Hypertension Mother    Hypertension Father    Cancer Father        lung   Breast cancer Neg Hx     Social History:   Social History   Socioeconomic History   Marital status: Widowed    Spouse name: Not on file    Number of children: 3   Years of education: Not on file   Highest education level: 9th grade  Occupational History   Occupation: RETIRED  Tobacco Use   Smoking status: Former    Current packs/day: 2.00    Average packs/day: 2.0 packs/day for 48.6 years (97.3 ttl pk-yrs)    Types: Cigarettes    Start date: 1977    Passive exposure: Current   Smokeless tobacco: Never   Tobacco comments:    1ppd as of 07/30/22 ep; Not ready to quit  Vaping Use   Vaping status: Never Used  Substance and Sexual Activity   Alcohol use: Not Currently   Drug use: Not Currently    Comment:  nothing since 01/12/2012   Sexual activity: Not Currently    Partners: Male  Other Topics Concern   Not on file  Social History Narrative   Lives alone/2025; widowed   Social Drivers of Health   Financial Resource Strain: Low Risk  (09/24/2023)   Overall Financial Resource Strain (CARDIA)    Difficulty of Paying Living Expenses: Not hard at all  Recent Concern: Financial Resource Strain - Medium Risk (07/28/2023)   Overall Financial Resource Strain (CARDIA)    Difficulty of Paying Living Expenses: Somewhat hard  Food Insecurity: No Food Insecurity (09/24/2023)   Hunger Vital Sign    Worried About Running Out of Food in the Last Year: Never true    Ran Out of Food in the Last Year: Never true  Transportation Needs: No Transportation Needs (09/24/2023)   PRAPARE - Administrator, Civil Service (Medical): No    Lack of Transportation (Non-Medical): No  Physical Activity: Inactive (09/24/2023)   Exercise Vital Sign    Days of Exercise per Week: 0 days    Minutes of Exercise per Session: 0 min  Stress: Stress Concern Present (09/24/2023)   [employer] of Occupational Health - Occupational Stress Questionnaire    Feeling of Stress: To some extent  Social Connections: Socially Isolated (09/24/2023)   Social Connection and Isolation Panel    Frequency of Communication with Friends and Family: More than  three times a week    Frequency of Social Gatherings with Friends and Family: Three times a week  Attends Religious Services: Never    Active Member of Clubs or Organizations: No    Attends Banker Meetings: Never    Marital Status: Widowed      Allergies:   Allergies  Allergen Reactions   Keflex [Cephalexin] Rash and Other (See Comments)    Has tolerated amoxicillin  and ceftriaxone  since had keflex   Shellfish Allergy Shortness Of Breath, Nausea And Vomiting and Other (See Comments)    Stomach cramps   Tetracyclines & Related Anaphylaxis, Swelling and Other (See Comments)    Throat swelling requiring hospitalization   Dilaudid  [Hydromorphone  Hcl] Other (See Comments)    Lethargy   Prednisone  Other (See Comments)    counter reacts, has tolerated IV medrol    Clindamycin /Lincomycin Other (See Comments)    Burning sensation in her mouth down her throat    Incruse Ellipta  [Umeclidinium Bromide ] Nausea Only   Molds & Smuts Itching    Allergic to everything, pt said she is allergic to most environmental allergens   Tuberculin Tests Other (See Comments)    False postive   Umeclidinium     Other Reaction(s): GI Intolerance    Metabolic Disorder Labs: Lab Results  Component Value Date   HGBA1C 6.3 (H) 08/15/2023   MPG 134.11 08/15/2023   MPG 148 09/08/2022   No results found for: PROLACTIN Lab Results  Component Value Date   CHOL 140 06/11/2021   TRIG 98.0 06/11/2021   HDL 51.10 06/11/2021   CHOLHDL 3 06/11/2021   VLDL 19.6 06/11/2021   LDLCALC 69 06/11/2021   LDLCALC 58 03/07/2020   Lab Results  Component Value Date   TSH 2.187 10/10/2023    Therapeutic Level Labs: No results found for: LITHIUM No results found for: CBMZ No results found for: VALPROATE  Current Medications: Current Outpatient Medications  Medication Sig Dispense Refill   ACCU-CHEK GUIDE TEST test strip USE TO CHECK BLOOD SUGAR IN THE  MORNING AT NOON AND AT BEDTIME  300 strip 2   albuterol  (PROVENTIL ) (2.5 MG/3ML) 0.083% nebulizer solution Take 2.5 mg by nebulization 2 (two) times daily.     albuterol  (VENTOLIN  HFA) 108 (90 Base) MCG/ACT inhaler Inhale 2 puffs into the lungs every 6 (six) hours as needed for wheezing or shortness of breath. 8 g 6   atenolol  (TENORMIN ) 25 MG tablet TAKE 1 TABLET BY MOUTH DAILY 100 tablet 1   atorvastatin  (LIPITOR) 20 MG tablet TAKE 1 TABLET BY MOUTH ONCE  DAILY 100 tablet 2   Budeson-Glycopyrrol-Formoterol  (BREZTRI  AEROSPHERE) 160-9-4.8 MCG/ACT AERO Inhale 2 puffs into the lungs 2 (two) times daily. 3 each 3   buPROPion  (WELLBUTRIN  XL) 300 MG 24 hr tablet TAKE 1 TABLET BY MOUTH DAILY 90 tablet 0   busPIRone  (BUSPAR ) 5 MG tablet Take 1 tablet (5 mg total) by mouth daily. 30 tablet 0   diazepam  (VALIUM ) 5 MG tablet Take 1 tablet (5 mg total) by mouth every 6 (six) hours as needed for anxiety.     fluticasone  (FLONASE ) 50 MCG/ACT nasal spray Place 2 sprays into both nostrils daily as needed for allergies.     furosemide  (LASIX ) 20 MG tablet Take 1 tablet (20 mg total) by mouth daily.     guaiFENesin -dextromethorphan  (ROBITUSSIN DM) 100-10 MG/5ML syrup Take 10 mLs by mouth every 8 (eight) hours. 118 mL 0   ipratropium-albuterol  (DUONEB) 0.5-2.5 (3) MG/3ML SOLN Take 3 mLs by nebulization every 4 (four) hours as needed (shortness of breath).     levothyroxine  (SYNTHROID ) 25 MCG tablet  TAKE 1 TABLET BY MOUTH DAILY  BEFORE BREAKFAST 100 tablet 1   MELATONIN PO Take 10 mg by mouth at bedtime as needed (sleep).     metformin  (FORTAMET ) 1000 MG (OSM) 24 hr tablet Take 1 tablet (1,000 mg total) by mouth daily with breakfast. 30 tablet 1   montelukast  (SINGULAIR ) 10 MG tablet TAKE 1 TABLET BY MOUTH AT  BEDTIME 100 tablet 2   oxyCODONE -acetaminophen  (PERCOCET/ROXICET) 5-325 MG tablet Take 1-2 tablets by mouth every 6 (six) hours as needed for severe pain (pain score 7-10).     OXYGEN  Inhale 3.5 L/min into the lungs continuous. Inhale  3  L/min of oxygen  into the lungs w/CPAP at bedtime and as needed for shortness of breath with strenuous activity during the day     pantoprazole  (PROTONIX ) 40 MG tablet TAKE 1 TABLET BY MOUTH DAILY 100 tablet 2   PATADAY  0.2 % SOLN Apply 1 drop to eye daily.     polyethylene glycol (MIRALAX  / GLYCOLAX ) 17 g packet Take 17 g by mouth daily as needed for mild constipation or moderate constipation.     pregabalin  (LYRICA ) 300 MG capsule Take 1 capsule (300 mg total) by mouth 2 (two) times daily. Take 2 tablets (600 mg) daily     QUEtiapine  150 MG TABS Take 150 mg by mouth at bedtime. 30 tablet 0   roflumilast  (DALIRESP ) 500 MCG TABS tablet Take 1 tablet (500 mcg total) by mouth daily. 90 tablet 3   tamsulosin  (FLOMAX ) 0.4 MG CAPS capsule Take 1 capsule (0.4 mg total) by mouth daily. 30 capsule 1   traZODone  (DESYREL ) 100 MG tablet Take 100 mg by mouth at bedtime. (Patient not taking: Reported on 09/24/2023)     venlafaxine  XR (EFFEXOR -XR) 150 MG 24 hr capsule TAKE 1 CAPSULE BY MOUTH DAILY  WITH BREAKFAST 100 capsule 2   Wound Dressings (DRS CHOICE DIABETIC BANDAGES) KIT Apply 8 kits topically 3 (three) times daily before meals. 8 kit 10   No current facility-administered medications for this visit.     Psychiatric Specialty Exam: Review of Systems  Cardiovascular:  Negative for chest pain.  Psychiatric/Behavioral:  Negative for substance abuse and suicidal ideas.     There were no vitals taken for this visit.There is no height or weight on file to calculate BMI.  General Appearance:casual  Eye Contact: fair  Speech:  Slow  Volume:  Decreased  Mood: fair  Affect:  congruent  Thought Process:  Goal Directed  Orientation:  Full (Time, Place, and Person)  Thought Content: Paranoia, possible delusion and hallucinations  Suicidal Thoughts:  No  Homicidal Thoughts:  No  Memory:  Immediate;   Fair Recent;   Fair  Judgement:  Fair  Insight:  Shallow  Psychomotor Activity:  Decreased   Concentration:  Concentration: Fair and Attention Span: Fair  Recall:  [employer] of Knowledge:Fair  Language: Fair  Akathisia:  No  Handed:    AIMS (if indicated): no involuntary movements  Assets:  Desire for Improvement Leisure Time Social Support  ADL's: limited due to recent surgery  Cognition: WNL  Sleep:  variable   Screenings: Mini-Mental    Flowsheet Row Clinical Support from 04/06/2018 in [facility]  Total Score (max 30 points ) 29   PHQ2-9    Flowsheet Row Clinical Support from 09/24/2023 in [facility] Clinical Support from 07/28/2023 in [facility] Clinical Support from 08/30/2022 in [facility] Visit from 08/13/2022 in [facility] Office Visit from 10/12/2021 in [facility]  PHQ-2 Total Score 2 4 0 0 2  PHQ-9 Total Score 6 14 -- -- 2   Flowsheet Row ED from 10/10/2023 in [facility] Emergency Department at [facility] ED to Hosp-Admission (Discharged) from 08/14/2023 in [facility] Office Visit from 06/17/2023 in [facility]  C-SSRS RISK CATEGORY No Risk No Risk No Risk    Assessment and Plan: as follows  Prior documentation reviewed  Mood disorder unspecified: relavant to multiple medical concerns and past history of using drugs, does not endorse depression as of now she feels medication working regarding her depression she is on Wellbutrin , Effexor  and Seroquel  no tremors noticeable  Hallucinations; possible part of schizophrenia or secondary medical comorbidity including COPD, irregularity in sleep.  Will increase Seroquel  from 100 to 150 mg consolidated sleep at night family aware to keep her awake during the day and avoid naps so that we can consolidate her sleep at night  PTSD:  Baseline continue Effexor  Insomnia: Reviewed sleep hygiene increase Seroquel  from 0100-0150 for hallucination continue trazodone  avoid naps during the daytime follow closely with medical providers and regarding her medical comorbidity contributing to her any irregularity in sleep or concentration. 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

## 2023-10-16 ENCOUNTER — Other Ambulatory Visit (HOSPITAL_BASED_OUTPATIENT_CLINIC_OR_DEPARTMENT_OTHER): Payer: Self-pay

## 2023-10-16 MED ORDER — BREZTRI AEROSPHERE 160-9-4.8 MCG/ACT IN AERO: 2.0000 | INHALATION_SPRAY | Freq: Two times a day (BID) | RESPIRATORY_TRACT | 3 refills | Status: DC

## 2023-10-23 ENCOUNTER — Emergency Department (HOSPITAL_COMMUNITY)

## 2023-10-23 ENCOUNTER — Encounter (HOSPITAL_COMMUNITY): Payer: Self-pay

## 2023-10-23 ENCOUNTER — Emergency Department (HOSPITAL_COMMUNITY)
Admission: EM | Admit: 2023-10-23 | Discharge: 2023-10-24 | Disposition: A | Discharge status: Home / Self Care | Source: Other Acute Inpatient Hospital | Attending: Emergency Medicine | Admitting: Emergency Medicine

## 2023-10-23 ENCOUNTER — Other Ambulatory Visit: Payer: Self-pay

## 2023-10-23 DIAGNOSIS — Z7951 Long term (current) use of inhaled steroids: Secondary | ICD-10-CM | POA: Insufficient documentation

## 2023-10-23 DIAGNOSIS — J9811 Atelectasis: Secondary | ICD-10-CM | POA: Diagnosis not present

## 2023-10-23 DIAGNOSIS — J441 Chronic obstructive pulmonary disease with (acute) exacerbation: Secondary | ICD-10-CM

## 2023-10-23 DIAGNOSIS — J984 Other disorders of lung: Secondary | ICD-10-CM | POA: Diagnosis not present

## 2023-10-23 DIAGNOSIS — E119 Type 2 diabetes mellitus without complications: Secondary | ICD-10-CM | POA: Insufficient documentation

## 2023-10-23 DIAGNOSIS — Z7984 Long term (current) use of oral hypoglycemic drugs: Secondary | ICD-10-CM | POA: Diagnosis not present

## 2023-10-23 DIAGNOSIS — R0989 Other specified symptoms and signs involving the circulatory and respiratory systems: Secondary | ICD-10-CM | POA: Diagnosis not present

## 2023-10-23 DIAGNOSIS — J449 Chronic obstructive pulmonary disease, unspecified: Secondary | ICD-10-CM | POA: Insufficient documentation

## 2023-10-23 DIAGNOSIS — R0602 Shortness of breath: Secondary | ICD-10-CM | POA: Diagnosis not present

## 2023-10-23 DIAGNOSIS — Z743 Need for continuous supervision: Secondary | ICD-10-CM | POA: Diagnosis not present

## 2023-10-23 LAB — CBC
HCT: 38.5 % (ref 36.0–46.0)
Hemoglobin: 11.7 g/dL — ABNORMAL LOW (ref 12.0–15.0)
MCH: 28.2 pg (ref 26.0–34.0)
MCHC: 30.4 g/dL (ref 30.0–36.0)
MCV: 92.8 fL (ref 80.0–100.0)
Platelets: 239 K/uL (ref 150–400)
RBC: 4.15 MIL/uL (ref 3.87–5.11)
RDW: 15.5 % (ref 11.5–15.5)
WBC: 8.6 K/uL (ref 4.0–10.5)
nRBC: 0 % (ref 0.0–0.2)

## 2023-10-23 LAB — BASIC METABOLIC PANEL WITH GFR
Anion gap: 13 (ref 5–15)
BUN: 19 mg/dL (ref 8–23)
CO2: 30 mmol/L (ref 22–32)
Calcium: 8.9 mg/dL (ref 8.9–10.3)
Chloride: 98 mmol/L (ref 98–111)
Creatinine, Ser: 0.89 mg/dL (ref 0.44–1.00)
GFR, Estimated: >60 mL/min (ref >60)
Glucose, Bld: 125 mg/dL — ABNORMAL HIGH (ref 70–99)
Potassium: 4.2 mmol/L (ref 3.5–5.1)
Sodium: 141 mmol/L (ref 135–145)

## 2023-10-23 NOTE — ED Triage Notes (Signed)
 [facility] from [employer]. Patient was shopping when she became SOB. Had her portable tank on 3l.  When ems arrived she was 90% on the 3l with audible upper wheezing. Ems gave albuterol  x2. And a duoben x1. Also gave 125 solumedrol 22g l upper arm. They placed her on 10l NRB and was 100%. Upon arrival patient was placed on 3l [place] and maintains at 100%

## 2023-10-24 MED ORDER — METHYLPREDNISOLONE 4 MG PO TBPK: ORAL_TABLET | ORAL | 0 refills | Status: DC

## 2023-10-24 NOTE — ED Notes (Signed)
Transport called for pt.

## 2023-10-24 NOTE — Discharge Instructions (Addendum)
 Begin taking Medrol  Dosepak as prescribed.  Continue albuterol  nebulizer treatments every 4 hours as needed.  Return to the ER if symptoms significantly worsen or change.

## 2023-10-24 NOTE — ED Provider Notes (Signed)
 [facility] AT [facility] Provider Note   CSN: [id number] Arrival date & time: 10/23/23  2154     Patient presents with: Shortness of Breath   Joan [name] is a 63 y.o. female.   Patient is a 63 year old female with past medical history of COPD, type 2 diabetes, bipolar.  Patient presenting today with complaints of shortness of breath.  She went to [employer] to buy groceries.  While getting out of the car, she suddenly became short of breath and began wheezing.  EMS was called and patient was given albuterol  and DuoNeb along with Solu-Medrol  is now feeling somewhat better.  She denies any chest pain.  No fevers, chills, or productive cough.       Prior to Admission medications   Medication Sig Start Date End Date Taking? Authorizing Provider  ACCU-CHEK GUIDE TEST test strip USE TO CHECK BLOOD SUGAR IN THE  MORNING AT [facility] AND AT BEDTIME 08/04/23   Rollene Almarie LABOR, MD  albuterol  (PROVENTIL ) (2.5 MG/3ML) 0.083% nebulizer solution Take 2.5 mg by nebulization 2 (two) times daily. 02/28/23   [provider]  albuterol  (VENTOLIN  HFA) 108 (90 Base) MCG/ACT inhaler Inhale 2 puffs into the lungs every 6 (six) hours as needed for wheezing or shortness of breath. 09/16/23   Ruthell Lauraine FALCON, NP  atenolol  (TENORMIN ) 25 MG tablet TAKE 1 TABLET BY MOUTH DAILY 05/15/23   Rollene Almarie LABOR, MD  atorvastatin  (LIPITOR) 20 MG tablet TAKE 1 TABLET BY MOUTH ONCE  DAILY 03/10/23   Rollene Almarie LABOR, MD  budesonide -glycopyrrolate-formoterol  (BREZTRI  AEROSPHERE) 160-9-4.8 MCG/ACT AERO inhaler Inhale 2 puffs into the lungs 2 (two) times daily. 10/16/23   Jude Harden GAILS, MD  buPROPion  (WELLBUTRIN  XL) 300 MG 24 hr tablet TAKE 1 TABLET BY MOUTH DAILY 06/11/23   Geralene Kaiser, MD  busPIRone  (BUSPAR ) 5 MG tablet Take 1 tablet (5 mg total) by mouth daily. 10/15/23   Geralene Kaiser, MD  diazepam  (VALIUM ) 5 MG tablet Take 1 tablet (5 mg total) by mouth every 6  (six) hours as needed for anxiety. 12/07/22   Johnson, Clanford L, MD  fluticasone  (FLONASE ) 50 MCG/ACT nasal spray Place 2 sprays into both nostrils daily as needed for allergies. 12/07/22 09/24/23  Johnson, Clanford L, MD  furosemide  (LASIX ) 20 MG tablet Take 1 tablet (20 mg total) by mouth daily. 08/15/23   Johnson, Clanford L, MD  guaiFENesin -dextromethorphan  (ROBITUSSIN DM) 100-10 MG/5ML syrup Take 10 mLs by mouth every 8 (eight) hours. 02/02/23   Shahmehdi, Adriana LABOR, MD  ipratropium-albuterol  (DUONEB) 0.5-2.5 (3) MG/3ML SOLN Take 3 mLs by nebulization every 4 (four) hours as needed (shortness of breath). 05/20/23   [provider]  levothyroxine  (SYNTHROID ) 25 MCG tablet TAKE 1 TABLET BY MOUTH DAILY  BEFORE BREAKFAST 08/14/23   Rollene Almarie LABOR, MD  MELATONIN PO Take 10 mg by mouth at bedtime as needed (sleep).    [provider]  metformin  (FORTAMET ) 1000 MG (OSM) 24 hr tablet Take 1 tablet (1,000 mg total) by mouth daily with breakfast. 02/02/23 09/24/23  Shahmehdi, Adriana LABOR, MD  montelukast  (SINGULAIR ) 10 MG tablet TAKE 1 TABLET BY MOUTH AT  BEDTIME 03/08/23   Jude Harden GAILS, MD  oxyCODONE -acetaminophen  (PERCOCET/ROXICET) 5-325 MG tablet Take 1-2 tablets by mouth every 6 (six) hours as needed for severe pain (pain score 7-10).    [provider]  OXYGEN  Inhale 3.5 L/min into the lungs continuous. Inhale  3 L/min of oxygen  into the lungs  w/CPAP at bedtime and as needed for shortness of breath with strenuous activity during the day    [provider]  pantoprazole  (PROTONIX ) 40 MG tablet TAKE 1 TABLET BY MOUTH DAILY 08/13/23   Rollene Almarie LABOR, MD  PATADAY  0.2 % SOLN Apply 1 drop to eye daily. 05/16/23   [provider]  polyethylene glycol (MIRALAX  / GLYCOLAX ) 17 g packet Take 17 g by mouth daily as needed for mild constipation or moderate constipation.    [provider]  pregabalin  (LYRICA ) 300 MG capsule Take 1 capsule (300 mg total) by  mouth 2 (two) times daily. Take 2 tablets (600 mg) daily 08/15/23   Johnson, Clanford L, MD  QUEtiapine  150 MG TABS Take 150 mg by mouth at bedtime. 10/15/23   Geralene Kaiser, MD  roflumilast  (DALIRESP ) 500 MCG TABS tablet Take 1 tablet (500 mcg total) by mouth daily. 04/02/22   Ruthell Lauraine FALCON, NP  tamsulosin  (FLOMAX ) 0.4 MG CAPS capsule Take 1 capsule (0.4 mg total) by mouth daily. 08/15/23   Johnson, Clanford L, MD  traZODone  (DESYREL ) 100 MG tablet Take 100 mg by mouth at bedtime. Patient not taking: Reported on 09/24/2023    [provider]  venlafaxine  XR (EFFEXOR -XR) 150 MG 24 hr capsule TAKE 1 CAPSULE BY MOUTH DAILY  WITH BREAKFAST 08/13/23   Rollene Almarie LABOR, MD  Wound Dressings (DRS CHOICE DIABETIC BANDAGES) KIT Apply 8 kits topically 3 (three) times daily before meals. 02/02/23   Willette Adriana LABOR, MD    Allergies: Keflex [cephalexin], Shellfish allergy, Tetracyclines & related, Dilaudid  [hydromorphone  hcl], Prednisone , Clindamycin /lincomycin, Incruse ellipta  [umeclidinium bromide ], Molds & smuts, Tuberculin tests, and Umeclidinium    Review of Systems  All other systems reviewed and are negative.   Updated Vital Signs BP (!) 171/85   Pulse 66   Temp 97.8 F (36.6 C) (Oral)   Resp 13   Ht 5' 6 (1.676 m)   Wt 95.8 kg   SpO2 91%   BMI 34.09 kg/m   Physical Exam Vitals and nursing note reviewed.  Constitutional:      General: She is not in acute distress.    Appearance: She is well-developed. She is not diaphoretic.  HENT:     Head: Normocephalic and atraumatic.  Cardiovascular:     Rate and Rhythm: Normal rate and regular rhythm.     Heart sounds: No murmur heard.    No friction rub. No gallop.  Pulmonary:     Effort: Pulmonary effort is normal. No respiratory distress.     Breath sounds: Examination of the right-middle field reveals wheezing. Examination of the left-middle field reveals wheezing. Wheezing present.  Abdominal:     General: Bowel sounds are  normal. There is no distension.     Palpations: Abdomen is soft.     Tenderness: There is no abdominal tenderness.  Musculoskeletal:        General: Normal range of motion.     Cervical back: Normal range of motion and neck supple.  Skin:    General: Skin is warm and dry.  Neurological:     General: No focal deficit present.     Mental Status: She is alert and oriented to person, place, and time.     (all labs ordered are listed, but only abnormal results are displayed) Labs Reviewed  BASIC METABOLIC PANEL WITH GFR - Abnormal; Notable for the following components:      Result Value   Glucose, Bld 125 (*)  All other components within normal limits  CBC - Abnormal; Notable for the following components:   Hemoglobin 11.7 (*)    All other components within normal limits    EKG: None  Radiology: DG Chest Portable 1 View Result Date: 10/23/2023 CLINICAL DATA:  Shortness of breath. EXAM: PORTABLE CHEST 1 VIEW COMPARISON:  August 15, 2023 FINDINGS: The heart size and mediastinal contours are within normal limits. There is marked severity calcification of the aortic arch. Advanced emphysematous lung disease is noted. There is mild prominence of the pulmonary vasculature. Mildly increased interstitial lung markings are also seen. Mild atelectatic changes are seen within the left lung base. No pleural effusion or pneumothorax is identified. Multilevel degenerative changes are noted throughout the thoracic spine. IMPRESSION: 1. Emphysematous lung disease with mild left basilar atelectasis. 2. Mild pulmonary vascular congestion. Electronically Signed   By: Suzen Dials M.D.   On: 10/23/2023 22:28     Procedures   Medications Ordered in the ED - No data to display                                  Medical Decision Making Amount and/or Complexity of Data Reviewed Labs: ordered. Radiology: ordered.   Patient with history of COPD presenting with wheezing and shortness of breath as  described in the HPI.  He arrives here with stable vital signs and is afebrile.  Physical examination does reveal mild expiratory wheezes, but patient is in no significant respiratory distress.  Oxygen  saturations are upper 90s on 3 L nasal cannula which is what she uses at home.  Laboratory studies obtained including CBC and basic metabolic panel, both of which are unremarkable.  Chest x-ray showing emphysematous changes, but no acute process.  At this point, patient seems to be feeling better with no respiratory distress or hypoxia.  I feel as though she can safely be discharged.  I will prescribe steroids and have her continue her nebulizer treatments at home.     Final diagnoses:  None    ED Discharge Orders     None          Geroldine Berg, MD 10/24/23 [phone]

## 2023-10-26 ENCOUNTER — Other Ambulatory Visit: Payer: Self-pay | Admitting: Internal Medicine

## 2023-11-04 ENCOUNTER — Encounter (HOSPITAL_COMMUNITY): Payer: Self-pay | Admitting: Emergency Medicine

## 2023-11-04 ENCOUNTER — Emergency Department (HOSPITAL_COMMUNITY)

## 2023-11-04 ENCOUNTER — Inpatient Hospital Stay (HOSPITAL_COMMUNITY)
Admission: EM | Admit: 2023-11-04 | Discharge: 2023-11-10 | DRG: 193 | Disposition: A | Discharge status: Hospice | Attending: Family Medicine | Admitting: Family Medicine

## 2023-11-04 DIAGNOSIS — Z881 Allergy status to other antibiotic agents status: Secondary | ICD-10-CM

## 2023-11-04 DIAGNOSIS — R6520 Severe sepsis without septic shock: Secondary | ICD-10-CM

## 2023-11-04 DIAGNOSIS — Z885 Allergy status to narcotic agent status: Secondary | ICD-10-CM

## 2023-11-04 DIAGNOSIS — J849 Interstitial pulmonary disease, unspecified: Secondary | ICD-10-CM | POA: Diagnosis present

## 2023-11-04 DIAGNOSIS — Z515 Encounter for palliative care: Secondary | ICD-10-CM | POA: Diagnosis not present

## 2023-11-04 DIAGNOSIS — Z743 Need for continuous supervision: Secondary | ICD-10-CM | POA: Diagnosis not present

## 2023-11-04 DIAGNOSIS — E1151 Type 2 diabetes mellitus with diabetic peripheral angiopathy without gangrene: Secondary | ICD-10-CM | POA: Diagnosis present

## 2023-11-04 DIAGNOSIS — E119 Type 2 diabetes mellitus without complications: Secondary | ICD-10-CM | POA: Diagnosis not present

## 2023-11-04 DIAGNOSIS — F4310 Post-traumatic stress disorder, unspecified: Secondary | ICD-10-CM | POA: Diagnosis present

## 2023-11-04 DIAGNOSIS — F319 Bipolar disorder, unspecified: Secondary | ICD-10-CM | POA: Diagnosis present

## 2023-11-04 DIAGNOSIS — J9621 Acute and chronic respiratory failure with hypoxia: Secondary | ICD-10-CM | POA: Diagnosis present

## 2023-11-04 DIAGNOSIS — J811 Chronic pulmonary edema: Secondary | ICD-10-CM | POA: Diagnosis not present

## 2023-11-04 DIAGNOSIS — Z7189 Other specified counseling: Secondary | ICD-10-CM | POA: Diagnosis not present

## 2023-11-04 DIAGNOSIS — Z602 Problems related to living alone: Secondary | ICD-10-CM | POA: Diagnosis present

## 2023-11-04 DIAGNOSIS — Z811 Family history of alcohol abuse and dependence: Secondary | ICD-10-CM

## 2023-11-04 DIAGNOSIS — E782 Mixed hyperlipidemia: Secondary | ICD-10-CM | POA: Diagnosis present

## 2023-11-04 DIAGNOSIS — K219 Gastro-esophageal reflux disease without esophagitis: Secondary | ICD-10-CM | POA: Diagnosis present

## 2023-11-04 DIAGNOSIS — J9612 Chronic respiratory failure with hypercapnia: Secondary | ICD-10-CM

## 2023-11-04 DIAGNOSIS — R531 Weakness: Secondary | ICD-10-CM | POA: Diagnosis not present

## 2023-11-04 DIAGNOSIS — I739 Peripheral vascular disease, unspecified: Secondary | ICD-10-CM | POA: Diagnosis present

## 2023-11-04 DIAGNOSIS — J449 Chronic obstructive pulmonary disease, unspecified: Secondary | ICD-10-CM | POA: Diagnosis not present

## 2023-11-04 DIAGNOSIS — Z825 Family history of asthma and other chronic lower respiratory diseases: Secondary | ICD-10-CM

## 2023-11-04 DIAGNOSIS — Z79890 Hormone replacement therapy (postmenopausal): Secondary | ICD-10-CM

## 2023-11-04 DIAGNOSIS — J439 Emphysema, unspecified: Secondary | ICD-10-CM | POA: Diagnosis present

## 2023-11-04 DIAGNOSIS — I10 Essential (primary) hypertension: Secondary | ICD-10-CM | POA: Diagnosis present

## 2023-11-04 DIAGNOSIS — Z6834 Body mass index (BMI) 34.0-34.9, adult: Secondary | ICD-10-CM

## 2023-11-04 DIAGNOSIS — Z789 Other specified health status: Secondary | ICD-10-CM | POA: Diagnosis not present

## 2023-11-04 DIAGNOSIS — Z833 Family history of diabetes mellitus: Secondary | ICD-10-CM

## 2023-11-04 DIAGNOSIS — A419 Sepsis, unspecified organism: Secondary | ICD-10-CM | POA: Diagnosis not present

## 2023-11-04 DIAGNOSIS — J9602 Acute respiratory failure with hypercapnia: Principal | ICD-10-CM

## 2023-11-04 DIAGNOSIS — F419 Anxiety disorder, unspecified: Secondary | ICD-10-CM | POA: Diagnosis present

## 2023-11-04 DIAGNOSIS — M47812 Spondylosis without myelopathy or radiculopathy, cervical region: Secondary | ICD-10-CM | POA: Diagnosis present

## 2023-11-04 DIAGNOSIS — R918 Other nonspecific abnormal finding of lung field: Secondary | ICD-10-CM | POA: Diagnosis not present

## 2023-11-04 DIAGNOSIS — E1165 Type 2 diabetes mellitus with hyperglycemia: Secondary | ICD-10-CM | POA: Diagnosis present

## 2023-11-04 DIAGNOSIS — Z9981 Dependence on supplemental oxygen: Secondary | ICD-10-CM | POA: Diagnosis not present

## 2023-11-04 DIAGNOSIS — Z7951 Long term (current) use of inhaled steroids: Secondary | ICD-10-CM

## 2023-11-04 DIAGNOSIS — Z66 Do not resuscitate: Secondary | ICD-10-CM | POA: Diagnosis not present

## 2023-11-04 DIAGNOSIS — K5909 Other constipation: Secondary | ICD-10-CM | POA: Diagnosis not present

## 2023-11-04 DIAGNOSIS — J3089 Other allergic rhinitis: Secondary | ICD-10-CM | POA: Diagnosis not present

## 2023-11-04 DIAGNOSIS — D508 Other iron deficiency anemias: Secondary | ICD-10-CM | POA: Diagnosis present

## 2023-11-04 DIAGNOSIS — I779 Disorder of arteries and arterioles, unspecified: Secondary | ICD-10-CM | POA: Diagnosis present

## 2023-11-04 DIAGNOSIS — F32A Depression, unspecified: Secondary | ICD-10-CM | POA: Diagnosis present

## 2023-11-04 DIAGNOSIS — E039 Hypothyroidism, unspecified: Secondary | ICD-10-CM | POA: Diagnosis present

## 2023-11-04 DIAGNOSIS — Z8249 Family history of ischemic heart disease and other diseases of the circulatory system: Secondary | ICD-10-CM

## 2023-11-04 DIAGNOSIS — J9622 Acute and chronic respiratory failure with hypercapnia: Secondary | ICD-10-CM | POA: Diagnosis present

## 2023-11-04 DIAGNOSIS — Z888 Allergy status to other drugs, medicaments and biological substances status: Secondary | ICD-10-CM

## 2023-11-04 DIAGNOSIS — J9601 Acute respiratory failure with hypoxia: Secondary | ICD-10-CM | POA: Diagnosis not present

## 2023-11-04 DIAGNOSIS — E1140 Type 2 diabetes mellitus with diabetic neuropathy, unspecified: Secondary | ICD-10-CM | POA: Diagnosis present

## 2023-11-04 DIAGNOSIS — Z981 Arthrodesis status: Secondary | ICD-10-CM

## 2023-11-04 DIAGNOSIS — Z96643 Presence of artificial hip joint, bilateral: Secondary | ICD-10-CM | POA: Diagnosis present

## 2023-11-04 DIAGNOSIS — Z87891 Personal history of nicotine dependence: Secondary | ICD-10-CM

## 2023-11-04 DIAGNOSIS — Z6372 Alcoholism and drug addiction in family: Secondary | ICD-10-CM

## 2023-11-04 DIAGNOSIS — I2510 Atherosclerotic heart disease of native coronary artery without angina pectoris: Secondary | ICD-10-CM | POA: Diagnosis present

## 2023-11-04 DIAGNOSIS — J189 Pneumonia, unspecified organism: Principal | ICD-10-CM | POA: Diagnosis present

## 2023-11-04 DIAGNOSIS — J441 Chronic obstructive pulmonary disease with (acute) exacerbation: Secondary | ICD-10-CM | POA: Diagnosis present

## 2023-11-04 DIAGNOSIS — G4733 Obstructive sleep apnea (adult) (pediatric): Secondary | ICD-10-CM | POA: Diagnosis present

## 2023-11-04 DIAGNOSIS — Z8619 Personal history of other infectious and parasitic diseases: Secondary | ICD-10-CM

## 2023-11-04 DIAGNOSIS — E66811 Obesity, class 1: Secondary | ICD-10-CM | POA: Diagnosis present

## 2023-11-04 DIAGNOSIS — J309 Allergic rhinitis, unspecified: Secondary | ICD-10-CM | POA: Diagnosis present

## 2023-11-04 DIAGNOSIS — Z813 Family history of other psychoactive substance abuse and dependence: Secondary | ICD-10-CM

## 2023-11-04 DIAGNOSIS — R0602 Shortness of breath: Secondary | ICD-10-CM | POA: Diagnosis not present

## 2023-11-04 DIAGNOSIS — Z558 Other problems related to education and literacy: Secondary | ICD-10-CM | POA: Diagnosis not present

## 2023-11-04 DIAGNOSIS — Z91013 Allergy to seafood: Secondary | ICD-10-CM

## 2023-11-04 DIAGNOSIS — Z79899 Other long term (current) drug therapy: Secondary | ICD-10-CM

## 2023-11-04 DIAGNOSIS — T380X5A Adverse effect of glucocorticoids and synthetic analogues, initial encounter: Secondary | ICD-10-CM | POA: Diagnosis present

## 2023-11-04 DIAGNOSIS — Z801 Family history of malignant neoplasm of trachea, bronchus and lung: Secondary | ICD-10-CM

## 2023-11-04 DIAGNOSIS — Z7984 Long term (current) use of oral hypoglycemic drugs: Secondary | ICD-10-CM

## 2023-11-04 DIAGNOSIS — J440 Chronic obstructive pulmonary disease with acute lower respiratory infection: Secondary | ICD-10-CM | POA: Diagnosis present

## 2023-11-04 DIAGNOSIS — Z7401 Bed confinement status: Secondary | ICD-10-CM | POA: Diagnosis not present

## 2023-11-04 LAB — BLOOD GAS, VENOUS
Acid-Base Excess: 11.1 mmol/L — ABNORMAL HIGH (ref 0.0–2.0)
Acid-Base Excess: 12.8 mmol/L — ABNORMAL HIGH (ref 0.0–2.0)
Bicarbonate: 40.7 mmol/L — ABNORMAL HIGH (ref 20.0–28.0)
Bicarbonate: 42 mmol/L — ABNORMAL HIGH (ref 20.0–28.0)
Drawn by: 442
Drawn by: 66297
O2 Saturation: 57.4 %
O2 Saturation: 52.7 %
Patient temperature: 36.8
Patient temperature: 36.8
pCO2, Ven: 78 mmHg (ref 44–60)
pCO2, Ven: 75 mmHg (ref 44–60)
pH, Ven: 7.32 (ref 7.25–7.43)
pH, Ven: 7.35 (ref 7.25–7.43)
pO2, Ven: 34 mmHg (ref 32–45)
pO2, Ven: 33 mmHg (ref 32–45)

## 2023-11-04 LAB — GLUCOSE, CAPILLARY
Glucose-Capillary: 245 mg/dL — ABNORMAL HIGH (ref 70–99)
Glucose-Capillary: 212 mg/dL — ABNORMAL HIGH (ref 70–99)

## 2023-11-04 LAB — CBC WITH DIFFERENTIAL/PLATELET
Abs Immature Granulocytes: 0.12 K/uL — ABNORMAL HIGH (ref 0.00–0.07)
Basophils Absolute: 0.1 K/uL (ref 0.0–0.1)
Basophils Relative: 0 %
Eosinophils Absolute: 0.1 K/uL (ref 0.0–0.5)
Eosinophils Relative: 0 %
HCT: 48.3 % — ABNORMAL HIGH (ref 36.0–46.0)
Hemoglobin: 14.4 g/dL (ref 12.0–15.0)
Immature Granulocytes: 1 %
Lymphocytes Relative: 6 %
Lymphs Abs: 1.4 K/uL (ref 0.7–4.0)
MCH: 28 pg (ref 26.0–34.0)
MCHC: 29.8 g/dL — ABNORMAL LOW (ref 30.0–36.0)
MCV: 93.8 fL (ref 80.0–100.0)
Monocytes Absolute: 0.6 K/uL (ref 0.1–1.0)
Monocytes Relative: 2 %
Neutro Abs: 22.1 K/uL — ABNORMAL HIGH (ref 1.7–7.7)
Neutrophils Relative %: 91 %
Platelets: 315 K/uL (ref 150–400)
RBC: 5.15 MIL/uL — ABNORMAL HIGH (ref 3.87–5.11)
RDW: 15.5 % (ref 11.5–15.5)
WBC: 24.5 K/uL — ABNORMAL HIGH (ref 4.0–10.5)
nRBC: 0 % (ref 0.0–0.2)

## 2023-11-04 LAB — PROTIME-INR
INR: 0.9 (ref 0.8–1.2)
Prothrombin Time: 13.1 s (ref 11.4–15.2)

## 2023-11-04 LAB — CBC
HCT: 46.9 % — ABNORMAL HIGH (ref 36.0–46.0)
Hemoglobin: 14.2 g/dL (ref 12.0–15.0)
MCH: 28.1 pg (ref 26.0–34.0)
MCHC: 30.3 g/dL (ref 30.0–36.0)
MCV: 92.7 fL (ref 80.0–100.0)
Platelets: 260 K/uL (ref 150–400)
RBC: 5.06 MIL/uL (ref 3.87–5.11)
RDW: 15.4 % (ref 11.5–15.5)
WBC: 21.6 K/uL — ABNORMAL HIGH (ref 4.0–10.5)
nRBC: 0 % (ref 0.0–0.2)

## 2023-11-04 LAB — LACTIC ACID, PLASMA: Lactic Acid, Venous: 1.5 mmol/L (ref 0.5–1.9)

## 2023-11-04 LAB — PROCALCITONIN: Procalcitonin: 0.2 ng/mL

## 2023-11-04 LAB — BASIC METABOLIC PANEL WITH GFR
Anion gap: 15 (ref 5–15)
BUN: 15 mg/dL (ref 8–23)
CO2: 31 mmol/L (ref 22–32)
Calcium: 9.4 mg/dL (ref 8.9–10.3)
Chloride: 99 mmol/L (ref 98–111)
Creatinine, Ser: 0.93 mg/dL (ref 0.44–1.00)
GFR, Estimated: >60 mL/min (ref >60)
Glucose, Bld: 146 mg/dL — ABNORMAL HIGH (ref 70–99)
Potassium: 3.7 mmol/L (ref 3.5–5.1)
Sodium: 145 mmol/L (ref 135–145)

## 2023-11-04 LAB — RESP PANEL BY RT-PCR (RSV, FLU A&B, COVID)  RVPGX2
Influenza A by PCR: NEGATIVE
Influenza B by PCR: NEGATIVE
Resp Syncytial Virus by PCR: NEGATIVE
SARS Coronavirus 2 by RT PCR: NEGATIVE

## 2023-11-04 LAB — HIV ANTIBODY (ROUTINE TESTING W REFLEX): HIV Screen 4th Generation wRfx: NONREACTIVE

## 2023-11-04 LAB — EXPECTORATED SPUTUM ASSESSMENT W GRAM STAIN, RFLX TO RESP C

## 2023-11-04 LAB — BRAIN NATRIURETIC PEPTIDE: B Natriuretic Peptide: 94 pg/mL (ref 0.0–100.0)

## 2023-11-04 LAB — MAGNESIUM: Magnesium: 1.6 mg/dL — ABNORMAL LOW (ref 1.7–2.4)

## 2023-11-04 LAB — PHOSPHORUS: Phosphorus: 2.9 mg/dL (ref 2.5–4.6)

## 2023-11-04 LAB — MRSA NEXT GEN BY PCR, NASAL: MRSA by PCR Next Gen: DETECTED — AB

## 2023-11-04 LAB — CBG MONITORING, ED: Glucose-Capillary: 138 mg/dL — ABNORMAL HIGH (ref 70–99)

## 2023-11-04 MED ORDER — ALBUTEROL (5 MG/ML) CONTINUOUS INHALATION SOLN
10.0000 mg/h | INHALATION_SOLUTION | Freq: Once | RESPIRATORY_TRACT | Status: DC
Start: 2023-11-04 — End: 2023-11-04

## 2023-11-04 MED ORDER — SODIUM CHLORIDE 0.9 % IV BOLUS (SEPSIS)
1000.0000 mL | Freq: Once | INTRAVENOUS | Status: DC
  Administered 2023-11-04: 1000 mL via INTRAVENOUS

## 2023-11-04 MED ORDER — PREGABALIN 75 MG PO CAPS
300.0000 mg | ORAL_CAPSULE | Freq: Two times a day (BID) | ORAL | Status: DC
  Administered 2023-11-04 – 2023-11-10 (×12): 300 mg via ORAL
  Filled 2023-11-04 (×12): qty 4

## 2023-11-04 MED ORDER — ALBUTEROL SULFATE (2.5 MG/3ML) 0.083% IN NEBU
INHALATION_SOLUTION | RESPIRATORY_TRACT | Status: AC
  Administered 2023-11-04: 10 mg via RESPIRATORY_TRACT
  Filled 2023-11-04: qty 12

## 2023-11-04 MED ORDER — SODIUM CHLORIDE 0.9% FLUSH
3.0000 mL | Freq: Two times a day (BID) | INTRAVENOUS | Status: DC
  Administered 2023-11-04 – 2023-11-10 (×12): 3 mL via INTRAVENOUS

## 2023-11-04 MED ORDER — QUETIAPINE FUMARATE 150 MG PO TABS: 150.0000 mg | ORAL_TABLET | Freq: Every day | ORAL | Status: DC

## 2023-11-04 MED ORDER — METHYLPREDNISOLONE SODIUM SUCC 125 MG IJ SOLR
80.0000 mg | Freq: Two times a day (BID) | INTRAMUSCULAR | Status: DC
Start: 2023-11-04 — End: 2023-11-04

## 2023-11-04 MED ORDER — LEVOTHYROXINE SODIUM 25 MCG PO TABS
25.0000 ug | ORAL_TABLET | Freq: Every day | ORAL | Status: DC
  Administered 2023-11-05 – 2023-11-10 (×6): 25 ug via ORAL
  Filled 2023-11-04 (×6): qty 1

## 2023-11-04 MED ORDER — LEVALBUTEROL HCL 0.63 MG/3ML IN NEBU
0.6300 mg | INHALATION_SOLUTION | Freq: Four times a day (QID) | RESPIRATORY_TRACT | Status: DC
  Administered 2023-11-04 – 2023-11-10 (×24): 0.63 mg via RESPIRATORY_TRACT
  Filled 2023-11-04 (×24): qty 3

## 2023-11-04 MED ORDER — DIAZEPAM 5 MG PO TABS
5.0000 mg | ORAL_TABLET | Freq: Four times a day (QID) | ORAL | Status: DC | PRN
  Administered 2023-11-04 – 2023-11-10 (×10): 5 mg via ORAL
  Filled 2023-11-04 (×10): qty 1

## 2023-11-04 MED ORDER — LACTATED RINGERS IV SOLN: INTRAVENOUS | Status: DC

## 2023-11-04 MED ORDER — ROFLUMILAST 500 MCG PO TABS
500.0000 ug | ORAL_TABLET | Freq: Every day | ORAL | Status: DC
  Administered 2023-11-05 – 2023-11-10 (×6): 500 ug via ORAL
  Filled 2023-11-04 (×7): qty 1

## 2023-11-04 MED ORDER — TAMSULOSIN HCL 0.4 MG PO CAPS
0.4000 mg | ORAL_CAPSULE | Freq: Every day | ORAL | Status: DC
  Administered 2023-11-05 – 2023-11-10 (×6): 0.4 mg via ORAL
  Filled 2023-11-04 (×6): qty 1

## 2023-11-04 MED ORDER — QUETIAPINE FUMARATE 25 MG PO TABS
150.0000 mg | ORAL_TABLET | Freq: Every day | ORAL | Status: DC
  Administered 2023-11-04 – 2023-11-09 (×6): 150 mg via ORAL
  Filled 2023-11-04 (×6): qty 2

## 2023-11-04 MED ORDER — INSULIN ASPART 100 UNIT/ML IJ SOLN
0.0000 [IU] | Freq: Three times a day (TID) | INTRAMUSCULAR | Status: DC
  Administered 2023-11-04: 7 [IU] via SUBCUTANEOUS
  Administered 2023-11-05: 3 [IU] via SUBCUTANEOUS
  Administered 2023-11-05: 4 [IU] via SUBCUTANEOUS
  Administered 2023-11-05: 11 [IU] via SUBCUTANEOUS

## 2023-11-04 MED ORDER — SENNOSIDES-DOCUSATE SODIUM 8.6-50 MG PO TABS
1.0000 | ORAL_TABLET | Freq: Two times a day (BID) | ORAL | Status: DC
  Administered 2023-11-04 – 2023-11-10 (×13): 1 via ORAL
  Filled 2023-11-04 (×13): qty 1

## 2023-11-04 MED ORDER — HYDRALAZINE HCL 20 MG/ML IJ SOLN
10.0000 mg | INTRAMUSCULAR | Status: DC | PRN
  Administered 2023-11-04: 10 mg via INTRAVENOUS
  Filled 2023-11-04 (×2): qty 1

## 2023-11-04 MED ORDER — HEPARIN SODIUM (PORCINE) 5000 UNIT/ML IJ SOLN
5000.0000 [IU] | Freq: Three times a day (TID) | INTRAMUSCULAR | Status: DC
  Administered 2023-11-04 – 2023-11-09 (×16): 5000 [IU] via SUBCUTANEOUS
  Filled 2023-11-04 (×17): qty 1

## 2023-11-04 MED ORDER — MUPIROCIN 2 % EX OINT
TOPICAL_OINTMENT | Freq: Two times a day (BID) | CUTANEOUS | Status: DC
  Administered 2023-11-06 – 2023-11-09 (×4): 1 via NASAL
  Filled 2023-11-04 (×2): qty 22

## 2023-11-04 MED ORDER — BUPROPION HCL ER (XL) 300 MG PO TB24
300.0000 mg | ORAL_TABLET | Freq: Every day | ORAL | Status: DC
  Administered 2023-11-05 – 2023-11-10 (×6): 300 mg via ORAL
  Filled 2023-11-04 (×2): qty 1
  Filled 2023-11-04 (×2): qty 2
  Filled 2023-11-04 (×2): qty 1

## 2023-11-04 MED ORDER — ALBUTEROL SULFATE (2.5 MG/3ML) 0.083% IN NEBU: 10.0000 mg | INHALATION_SOLUTION | Freq: Once | RESPIRATORY_TRACT | Status: AC

## 2023-11-04 MED ORDER — IPRATROPIUM BROMIDE 0.02 % IN SOLN
0.5000 mg | Freq: Four times a day (QID) | RESPIRATORY_TRACT | Status: DC
  Administered 2023-11-04 – 2023-11-10 (×24): 0.5 mg via RESPIRATORY_TRACT
  Filled 2023-11-04 (×25): qty 2.5

## 2023-11-04 MED ORDER — BISACODYL 5 MG PO TBEC: 5.0000 mg | DELAYED_RELEASE_TABLET | Freq: Every day | ORAL | Status: DC | PRN

## 2023-11-04 MED ORDER — ACETAMINOPHEN 325 MG PO TABS: 650.0000 mg | ORAL_TABLET | Freq: Four times a day (QID) | ORAL | Status: DC | PRN

## 2023-11-04 MED ORDER — METHYLPREDNISOLONE SODIUM SUCC 125 MG IJ SOLR
40.0000 mg | Freq: Three times a day (TID) | INTRAMUSCULAR | Status: DC
  Administered 2023-11-04 – 2023-11-07 (×8): 40 mg via INTRAVENOUS
  Filled 2023-11-04 (×8): qty 2

## 2023-11-04 MED ORDER — FLEET ENEMA RE ENEM: 1.0000 | ENEMA | Freq: Once | RECTAL | Status: DC | PRN

## 2023-11-04 MED ORDER — POLYETHYLENE GLYCOL 3350 17 G PO PACK
17.0000 g | PACK | Freq: Every day | ORAL | Status: DC
  Administered 2023-11-05 – 2023-11-07 (×3): 17 g via ORAL
  Filled 2023-11-04 (×3): qty 1

## 2023-11-04 MED ORDER — ONDANSETRON HCL 4 MG PO TABS: 4.0000 mg | ORAL_TABLET | Freq: Four times a day (QID) | ORAL | Status: DC | PRN

## 2023-11-04 MED ORDER — LEVOFLOXACIN IN D5W 750 MG/150ML IV SOLN
750.0000 mg | Freq: Once | INTRAVENOUS | Status: AC
  Administered 2023-11-04: 750 mg via INTRAVENOUS
  Filled 2023-11-04: qty 150

## 2023-11-04 MED ORDER — OXYCODONE HCL 5 MG PO TABS
5.0000 mg | ORAL_TABLET | ORAL | Status: DC | PRN
  Administered 2023-11-04 – 2023-11-08 (×9): 5 mg via ORAL
  Filled 2023-11-04 (×9): qty 1

## 2023-11-04 MED ORDER — SODIUM CHLORIDE 0.9% FLUSH
3.0000 mL | Freq: Two times a day (BID) | INTRAVENOUS | Status: DC
  Administered 2023-11-04 – 2023-11-10 (×9): 3 mL via INTRAVENOUS

## 2023-11-04 MED ORDER — SENNOSIDES-DOCUSATE SODIUM 8.6-50 MG PO TABS: 1.0000 | ORAL_TABLET | Freq: Every evening | ORAL | Status: DC | PRN

## 2023-11-04 MED ORDER — ACETAMINOPHEN 650 MG RE SUPP: 650.0000 mg | Freq: Four times a day (QID) | RECTAL | Status: DC | PRN

## 2023-11-04 MED ORDER — PANTOPRAZOLE SODIUM 40 MG PO TBEC
40.0000 mg | DELAYED_RELEASE_TABLET | Freq: Every day | ORAL | Status: DC
Start: 2023-11-04 — End: 2023-11-10
  Administered 2023-11-04 – 2023-11-10 (×7): 40 mg via ORAL
  Filled 2023-11-04 (×7): qty 1

## 2023-11-04 MED ORDER — TRAZODONE HCL 50 MG PO TABS: 25.0000 mg | ORAL_TABLET | Freq: Every evening | ORAL | Status: DC | PRN

## 2023-11-04 MED ORDER — METHYLPREDNISOLONE SODIUM SUCC 125 MG IJ SOLR
125.0000 mg | Freq: Once | INTRAMUSCULAR | Status: AC
  Administered 2023-11-04: 125 mg via INTRAVENOUS
  Filled 2023-11-04: qty 2

## 2023-11-04 MED ORDER — LEVOFLOXACIN IN D5W 750 MG/150ML IV SOLN
750.0000 mg | INTRAVENOUS | Status: DC
  Administered 2023-11-05 – 2023-11-08 (×4): 750 mg via INTRAVENOUS
  Filled 2023-11-04 (×4): qty 150

## 2023-11-04 MED ORDER — ATENOLOL 25 MG PO TABS
25.0000 mg | ORAL_TABLET | Freq: Every day | ORAL | Status: DC
  Administered 2023-11-05 – 2023-11-10 (×6): 25 mg via ORAL
  Filled 2023-11-04 (×6): qty 1

## 2023-11-04 MED ORDER — ONDANSETRON HCL 4 MG/2ML IJ SOLN: 4.0000 mg | Freq: Four times a day (QID) | INTRAMUSCULAR | Status: DC | PRN

## 2023-11-04 MED ORDER — GUAIFENESIN-DM 100-10 MG/5ML PO SYRP
10.0000 mL | ORAL_SOLUTION | Freq: Three times a day (TID) | ORAL | Status: DC
  Administered 2023-11-04 – 2023-11-10 (×18): 10 mL via ORAL
  Filled 2023-11-04 (×19): qty 10

## 2023-11-04 NOTE — Plan of Care (Signed)
  Problem: Education: Goal: Knowledge of General Education information will improve Description: Including pain rating scale, medication(s)/side effects and non-pharmacologic comfort measures Outcome: Progressing   Problem: Nutrition: Goal: Adequate nutrition will be maintained Outcome: Progressing   Problem: Safety: Goal: Ability to remain free from injury will improve Outcome: Progressing   Problem: Tissue Perfusion: Goal: Adequacy of tissue perfusion will improve Outcome: Progressing

## 2023-11-04 NOTE — Hospital Course (Signed)
 Joan [name] is a 63 year old female on home hospice with end-stage terminal COPD, chronic interstitial lung disease, chronic hypoxic respiratory failure-on O2 demand~5 L, CAD, h/o tobacco abuse, chest pains, HTN, anxiety, bipolar disorder, hypothyroidism, DM2, neuropathy, GERD, HLD, obesity, OSA, on CPAP, polypharmacy, PTSD, multiple hospitalization...SABRASABRA Presenting once again with shortness of breath and hypoxia.  Per EMS was complaining of shortness of breath and distress on 4 L at home was satting in 70s, on BiPAP improved to 94% after breathing treatments.   ED Evaluation: Blood pressure (!) 158/95, pulse (!) 137, temperature 98.2 F (36.8 C), resp. rate (!) 26, height 5' 6 (1.676 m), weight 95.7 kg, SpO2 99% on BiPAP.  VBG: pH 7.32/pCO2 78/pO2 34/bicarb 40.7 WBC 24.5 Respiratory panel RSV, influenza A/B's, COVID All negative  CXR : 1. Worsened bibasilar opacities, which is favored to represent interstitial edema. Superimposed infection not excluded. 2. Possible trace pleural effusions.   Requested patient to be admitted for acute on chronic respiratory failure, possible pneumonia

## 2023-11-04 NOTE — Assessment & Plan Note (Signed)
-   Not on iron supplements, will monitor H&H

## 2023-11-04 NOTE — Assessment & Plan Note (Signed)
 Polypharmacy continue as needed Valium 

## 2023-11-04 NOTE — ED Triage Notes (Signed)
 Pt BIB [facility] from home c/o resp distress, pt on 4L O2 at home, sats 70% upon EMS arrival, up to 94% on cpap; pt given 3 albuterol  and 2 duo nebs en route; decreased lower lung sounds, rhonchi and wheezing noted by EMS, ST on monitor, BP 180/70 en route, hx of COPD, EDP and RT at bedside upon arrival

## 2023-11-04 NOTE — Assessment & Plan Note (Signed)
-   Continue statins beta-blockers, asa

## 2023-11-04 NOTE — Assessment & Plan Note (Signed)
-   Will review, and resume bowel regimens

## 2023-11-04 NOTE — H&P (Signed)
 History and Physical   Patient: Joan [name]                            PCP: Rollene Almarie LABOR, MD                    DOB: 1960/11/09            DOA: 11/04/2023 FMW:[id number]             DOS: 11/04/2023, 3:41 PM  Rollene Almarie LABOR, MD  Patient coming from:   HOME  I have personally reviewed patient's medical records, in electronic medical records, including:  [facility], and care everywhere.    Chief Complaint:   Chief Complaint  Patient presents with   Respiratory Distress    History of present illness:    Duana Benedict is a 63 year old female on home hospice with end-stage terminal COPD, chronic interstitial lung disease, chronic hypoxic respiratory failure-on O2 demand~5 L, CAD, h/o tobacco abuse, chest pains, HTN, anxiety, bipolar disorder, hypothyroidism, DM2, neuropathy, GERD, HLD, obesity, OSA, on CPAP, polypharmacy, PTSD, multiple hospitalization...SABRASABRA Presenting once again with shortness of breath and hypoxia.  Per EMS was complaining of shortness of breath and distress on 4 L at home was satting in 70s, on BiPAP improved to 94% after breathing treatments.   ED Evaluation: Blood pressure (!) 158/95, pulse (!) 137, temperature 98.2 F (36.8 C), resp. rate (!) 26, height 5' 6 (1.676 m), weight 95.7 kg, SpO2 99% on BiPAP.  VBG: pH 7.32/pCO2 78/pO2 34/bicarb 40.7 WBC 24.5 Respiratory panel RSV, influenza A/B's, COVID All negative  CXR : 1. Worsened bibasilar opacities, which is favored to represent interstitial edema. Superimposed infection not excluded. 2. Possible trace pleural effusions.   Requested patient to be admitted for acute on chronic respiratory failure, possible pneumonia  Patient Denies having: Fever, Chills, Chest Pain, Abd pain, N/V/D, headache, dizziness, lightheadedness,  Dysuria, Joint pain, rash, open wounds    Review of Systems: As per HPI, otherwise 10 point review of systems were negative.    ----------------------------------------------------------------------------------------------------------------------  Allergies  Allergen Reactions   Keflex [Cephalexin] Rash and Other (See Comments)    Has tolerated amoxicillin  and ceftriaxone  since had keflex   Shellfish Allergy Shortness Of Breath, Nausea And Vomiting and Other (See Comments)    Stomach cramps   Tetracyclines & Related Anaphylaxis, Swelling and Other (See Comments)    Throat swelling requiring hospitalization   Dilaudid  [Hydromorphone  Hcl] Other (See Comments)    Lethargy   Prednisone  Other (See Comments)    counter reacts, has tolerated IV medrol    Clindamycin /Lincomycin Other (See Comments)    Burning sensation in her mouth down her throat    Incruse Ellipta  [Umeclidinium Bromide ] Nausea Only   Molds & Smuts Itching    Allergic to everything, pt said she is allergic to most environmental allergens   Tuberculin Tests Other (See Comments)    False postive   Umeclidinium     Other Reaction(s): GI Intolerance    Home MEDs:  Prior to Admission medications   Medication Sig Start Date End Date Taking? Authorizing Provider  albuterol  (VENTOLIN  HFA) 108 (90 Base) MCG/ACT inhaler Inhale 2 puffs into the lungs every 6 (six) hours as needed for wheezing or shortness of breath. 09/16/23  Yes Ruthell Lauraine FALCON, NP  atenolol  (TENORMIN ) 25 MG tablet TAKE 1 TABLET BY MOUTH DAILY 05/15/23  Yes Rollene Almarie LABOR, MD  atorvastatin  (  LIPITOR) 20 MG tablet TAKE 1 TABLET BY MOUTH ONCE  DAILY 03/10/23  Yes Rollene Almarie LABOR, MD  budesonide -glycopyrrolate-formoterol  (BREZTRI  AEROSPHERE) 160-9-4.8 MCG/ACT AERO inhaler Inhale 2 puffs into the lungs 2 (two) times daily. 10/16/23  Yes Jude Harden GAILS, MD  buPROPion  (WELLBUTRIN  XL) 300 MG 24 hr tablet TAKE 1 TABLET BY MOUTH DAILY 06/11/23  Yes Geralene Kaiser, MD  busPIRone  (BUSPAR ) 5 MG tablet Take 1 tablet (5 mg total) by mouth daily. 10/15/23  Yes Geralene Kaiser, MD  diazepam   (VALIUM ) 5 MG tablet Take 1 tablet (5 mg total) by mouth every 6 (six) hours as needed for anxiety. 12/07/22  Yes Johnson, Clanford L, MD  guaiFENesin -dextromethorphan  (ROBITUSSIN DM) 100-10 MG/5ML syrup Take 10 mLs by mouth every 8 (eight) hours. 02/02/23  Yes Joeanne Robicheaux A, MD  ipratropium-albuterol  (DUONEB) 0.5-2.5 (3) MG/3ML SOLN Take 3 mLs by nebulization every 4 (four) hours as needed (shortness of breath). 05/20/23  Yes [provider]  levothyroxine  (SYNTHROID ) 25 MCG tablet TAKE 1 TABLET BY MOUTH DAILY  BEFORE BREAKFAST 08/14/23  Yes Rollene Almarie LABOR, MD  metFORMIN  (GLUCOPHAGE ) 500 MG tablet Take 500 mg by mouth daily. 10/10/23  Yes [provider]  ACCU-CHEK GUIDE TEST test strip USE TO CHECK BLOOD SUGAR IN THE  MORNING AT [facility] AND AT BEDTIME 08/04/23   Rollene Almarie LABOR, MD  albuterol  (PROVENTIL ) (2.5 MG/3ML) 0.083% nebulizer solution Take 2.5 mg by nebulization 2 (two) times daily. Patient not taking: Reported on 11/04/2023 02/28/23   [provider]  fluticasone  (FLONASE ) 50 MCG/ACT nasal spray Place 2 sprays into both nostrils daily as needed for allergies. 12/07/22 09/24/23  Johnson, Clanford L, MD  furosemide  (LASIX ) 20 MG tablet Take 1 tablet (20 mg total) by mouth daily. Patient not taking: Reported on 11/04/2023 08/15/23   Johnson, Clanford L, MD  MELATONIN PO Take 10 mg by mouth at bedtime as needed (sleep).    [provider]  metformin  (FORTAMET ) 1000 MG (OSM) 24 hr tablet Take 1 tablet (1,000 mg total) by mouth daily with breakfast. 02/02/23 09/24/23  Willette Adriana LABOR, MD  methylPREDNISolone  (MEDROL  DOSEPAK) 4 MG TBPK tablet Begin taking as per package instructions 10/24/23   Geroldine Berg, MD  montelukast  (SINGULAIR ) 10 MG tablet TAKE 1 TABLET BY MOUTH AT  BEDTIME 03/08/23   Jude Harden GAILS, MD  oxyCODONE -acetaminophen  (PERCOCET/ROXICET) 5-325 MG tablet Take 1-2 tablets by mouth every 6 (six) hours as needed for severe pain (pain score 7-10).     [provider]  OXYGEN  Inhale 3.5 L/min into the lungs continuous. Inhale  3 L/min of oxygen  into the lungs w/CPAP at bedtime and as needed for shortness of breath with strenuous activity during the day    [provider]  pantoprazole  (PROTONIX ) 40 MG tablet TAKE 1 TABLET BY MOUTH DAILY 08/13/23   Rollene Almarie LABOR, MD  PATADAY  0.2 % SOLN Apply 1 drop to eye daily. 05/16/23   [provider]  polyethylene glycol (MIRALAX  / GLYCOLAX ) 17 g packet Take 17 g by mouth daily as needed for mild constipation or moderate constipation.    [provider]  pregabalin  (LYRICA ) 300 MG capsule Take 1 capsule (300 mg total) by mouth 2 (two) times daily. Take 2 tablets (600 mg) daily 08/15/23   Johnson, Clanford L, MD  QUEtiapine  150 MG TABS Take 150 mg by mouth at bedtime. 10/15/23   Geralene Kaiser, MD  roflumilast  (DALIRESP ) 500 MCG TABS tablet Take 1 tablet (500 mcg total) by  mouth daily. 04/02/22   Ruthell Lauraine FALCON, NP  tamsulosin  (FLOMAX ) 0.4 MG CAPS capsule Take 1 capsule (0.4 mg total) by mouth daily. 08/15/23   Johnson, Clanford L, MD  traZODone  (DESYREL ) 100 MG tablet Take 100 mg by mouth at bedtime. Patient not taking: Reported on 09/24/2023    [provider]  venlafaxine  XR (EFFEXOR -XR) 150 MG 24 hr capsule TAKE 1 CAPSULE BY MOUTH DAILY  WITH BREAKFAST 08/13/23   Rollene Almarie LABOR, MD  Wound Dressings (DRS CHOICE DIABETIC BANDAGES) KIT Apply 8 kits topically 3 (three) times daily before meals. 02/02/23   ShahmehdiAdriana LABOR, MD    PRN MEDs: acetaminophen  **OR** acetaminophen , diazepam , hydrALAZINE , ondansetron  **OR** ondansetron  (ZOFRAN ) IV, oxyCODONE   Past Medical History:  Diagnosis Date   Acute respiratory failure with hypoxia (HCC)    Alcoholism and drug addiction in family    Anxiety    Appendicitis    Arthritis    knees, hips, lower back (09/25/2016)   Asthma    Bipolar disorder (HCC)    Chronic bronchitis (HCC)    all the time (09/25/2016)    Chronic leg pain    RLE   Chronic lower back pain    COPD (chronic obstructive pulmonary disease) (HCC)    Depression    Diabetes mellitus without complication (HCC)    Diabetic neuropathy (HCC)    Early cataracts, bilateral    Emphysema of lung (HCC)    Generalized anxiety disorder 02/09/2020   GERD (gastroesophageal reflux disease)    Headache    Hepatitis C    treated back in 2001-2002   High cholesterol    Hypertension    Hypothyroidism    Incarcerated ventral hernia 04/04/2017   Mood disorder in conditions classified elsewhere 02/09/2020   On home oxygen  therapy    2L; at nighttime (04/04/2017)   OSA on CPAP    CPAP with Oxygen  3 liters   Paranoid (HCC)    Pneumonia    all the time (09/25/2016)   Positive PPD    with negative chest x ray   PTSD (post-traumatic stress disorder) 02/09/2020   Tachycardia    patient reports with exertion ; denies any other conditions    Thyroid  disease    Wears glasses    Wears partial dentures     Past Surgical History:  Procedure Laterality Date   ANTERIOR CERVICAL DECOMP/DISCECTOMY FUSION N/A 02/14/2022   Procedure: Anterior Cervical Decompression/Discectomy Fusion Cervical Three-Cervical Four;  Surgeon: Debby Dorn MATSU, MD;  Location: [facility] OR;  Service: Neurosurgery;  Laterality: N/A;  3C   APPENDECTOMY     BACK SURGERY     BIOPSY  11/24/2018   Procedure: BIOPSY;  Surgeon: Eda Iha, MD;  Location: [facility];  Service: Gastroenterology;;   CARDIAC CATHETERIZATION     COLONOSCOPY     COLONOSCOPY WITH PROPOFOL  N/A 11/24/2018   Procedure: COLONOSCOPY WITH PROPOFOL ;  Surgeon: Eda Iha, MD;  Location: [facility];  Service: Gastroenterology;  Laterality: N/A;   CYST EXCISION     removal of cyst in sinuses   DILATION AND CURETTAGE OF UTERUS     ESOPHAGOGASTRODUODENOSCOPY (EGD) WITH PROPOFOL  N/A 11/24/2018   Procedure: ESOPHAGOGASTRODUODENOSCOPY (EGD) WITH PROPOFOL ;  Surgeon: Eda Iha, MD;  Location:  [facility];  Service: Gastroenterology;  Laterality: N/A;   FOOT SURGERY Bilateral    bone removed from 4th and 5 th toes and put in pin then took pin out   HERNIA REPAIR     INCISION AND DRAINAGE  ~  2014/2015   removed mesh & infection   INCISIONAL HERNIA REPAIR N/A 04/04/2017   Procedure: LAPAROSCOPIC INCISIONAL HERNIA REPAIR WITH MESH;  Surgeon: Gail Favorite, MD;  Location: [facility] OR;  Service: General;  Laterality: N/A;   LACERATION REPAIR Right    repair wrist artery from laceration from ice skate   LAPAROSCOPIC APPENDECTOMY N/A 03/31/2019   Procedure: APPENDECTOMY LAPAROSCOPIC;  Surgeon: Rubin Calamity, MD;  Location: [facility]S;  Service: General;  Laterality: N/A;   LAPAROSCOPIC APPENDECTOMY N/A 07/09/2019   Procedure: APPENDECTOMY LAPAROSCOPIC;  Surgeon: Rubin Calamity, MD;  Location: [facility] OR;  Service: General;  Laterality: N/A;   LAPAROSCOPIC INCISIONAL / UMBILICAL / VENTRAL HERNIA REPAIR  04/04/2017   LAPAROSCOPIC INCISIONAL HERNIA REPAIR WITH MESH   LIPOMA EXCISION Right    fattty lipoma in neck   NASAL SEPTUM SURGERY     POSTERIOR LUMBAR FUSION  2015/2016 X 2   added rods and screws   SINOSCOPY     TOTAL HIP ARTHROPLASTY Right 02/05/2017   Procedure: RIGHT TOTAL HIP ARTHROPLASTY;  Surgeon: Heide Ingle, MD;  Location: [facility];  Service: Orthopedics;  Laterality: Right;   TOTAL HIP ARTHROPLASTY Left 06/18/2017   Procedure: LEFT TOTAL HIP ARTHROPLASTY;  Surgeon: Heide Ingle, MD;  Location: [facility];  Service: Orthopedics;  Laterality: Left;   TOTAL HIP REVISION Left 01/26/2019   Procedure: TOTAL HIP REVISION;  Surgeon: Ernie Cough, MD;  Location: [facility];  Service: Orthopedics;  Laterality: Left;  2 hrs   TUBAL LIGATION     UMBILICAL HERNIA REPAIR  ~ 2013   w/mesh   UPPER GI ENDOSCOPY       reports that she has quit smoking. Her smoking use included cigarettes. She started smoking about 48 years ago. She has a 97.4 pack-year smoking history. She has been exposed  to tobacco smoke. She has never used smokeless tobacco. She reports that she does not currently use alcohol. She reports that she does not currently use drugs.   Family History  Problem Relation Age of Onset   Diabetes Mother    Hypertension Mother    Hypertension Father    Cancer Father        lung   Breast cancer Neg Hx     Physical Exam:   Vitals:   11/04/23 1410 11/04/23 1416 11/04/23 1503 11/04/23 1519  BP: (!) 160/93     Pulse: (!) 115     Resp: (!) 26     Temp:   98.1 F (36.7 C)   TempSrc:   Axillary   SpO2: 100% 99%  90%  Weight:  96.5 kg    Height:  5' 8 (1.727 m)     Constitutional: Significant shortness of breath, on BiPAP awake, alert following commands -in moderate distress Eyes: PERRL, lids and conjunctivae normal ENMT: Mucous membranes are moist. Posterior pharynx clear of any exudate or lesions.Normal dentition.  Neck: normal, supple, no masses, no thyromegaly Respiratory: Labored breathing with diffuse wheezing, rhonchi,  Diminished breath sounds at lower lobes  cardiovascular: Regular rate and rhythm, no murmurs / rubs / gallops. No extremity edema. 2+ pedal pulses. No carotid bruits.  Abdomen: no tenderness, no masses palpated. No hepatosplenomegaly. Bowel sounds positive.  Musculoskeletal: no clubbing / cyanosis. No joint deformity upper and lower extremities. Good ROM, no contractures. Normal muscle tone.  Neurologic: CN II-XII grossly intact. Sensation intact, DTR normal. Strength 5/5 in all 4.  Psychiatric: Normal judgment and insight. Alert and oriented x 3. Normal mood.  Skin:  no rashes, lesions, ulcers. No induration      Labs on admission:    I have personally reviewed following labs and imaging studies  CBC: Recent Labs  Lab 11/04/23 1150 11/04/23 1342  WBC 24.5* 21.6*  NEUTROABS 22.1*  --   HGB 14.4 14.2  HCT 48.3* 46.9*  MCV 93.8 92.7  PLT 315 260   Basic Metabolic Panel: Recent Labs  Lab 11/04/23 1150 11/04/23 1342  NA  145  --   K 3.7  --   CL 99  --   CO2 31  --   GLUCOSE 146*  --   BUN 15  --   CREATININE 0.93  --   CALCIUM  9.4  --   MG  --  1.6*  PHOS  --  2.9    Coagulation Profile: Recent Labs  Lab 11/04/23 1342  INR 0.9    CBG: Recent Labs  Lab 11/04/23 1137  GLUCAP 138*    Urine analysis:    Component Value Date/Time   COLORURINE YELLOW 01/29/2023 1810   APPEARANCEUR CLEAR 01/29/2023 1810   LABSPEC >1.046 (H) 01/29/2023 1810   PHURINE 7.0 01/29/2023 1810   GLUCOSEU NEGATIVE 01/29/2023 1810   GLUCOSEU NEGATIVE 08/05/2017 1652   HGBUR SMALL (A) 01/29/2023 1810   BILIRUBINUR NEGATIVE 01/29/2023 1810   KETONESUR NEGATIVE 01/29/2023 1810   PROTEINUR 30 (A) 01/29/2023 1810   UROBILINOGEN 0.2 08/05/2017 1652   NITRITE NEGATIVE 01/29/2023 1810   LEUKOCYTESUR NEGATIVE 01/29/2023 1810    Last A1C:  Lab Results  Component Value Date   HGBA1C 6.3 (H) 08/15/2023     Radiologic Exams on Admission:   DG Chest Port 1 View Result Date: 11/04/2023 EXAM: 1 VIEW XRAY OF THE CHEST 11/04/2023 12:13:00 PM COMPARISON: 10/23/2023 CLINICAL HISTORY: Cough, shortness of breath. Per triage: Patient brought in by EMS from home complaining of respiratory distress, on 4L O2 at home, sats 70% upon EMS arrival, up to 94% on CPAP; patient given 3 albuterol  and 2 duonebs en route; decreased lower lung sounds, rhonchi and wheezing noted by EMS, ST on monitor, BP 180/70 en route; history of COPD, EDP and RT at bedside upon arrival. FINDINGS: LUNGS AND PLEURA: Worsened bibasilar interstitial opacities. Possible trace pleural effusions. No pneumothorax. HEART AND MEDIASTINUM: No acute abnormality of the cardiac and mediastinal silhouettes. BONES AND SOFT TISSUES: No acute osseous abnormality. IMPRESSION: 1. Worsened bibasilar opacities, which is favored to represent interstitial edema. Superimposed infection not excluded. 2. Possible trace pleural effusions. Electronically signed by: Waddell Calk MD 11/04/2023  12:56 PM EDT RP Workstation: [id number]    EKG:   Independently reviewed.  Orders placed or performed during the hospital encounter of 11/04/23   EKG 12-Lead   EKG 12-Lead   ED EKG - No EKG has been ordered for this patient.   ED EKG - No EKG has been ordered for this patient.   EKG 12-Lead   EKG 12-Lead   EKG 12-Lead   EKG 12-Lead   EKG 12-Lead   *Note: Due to a large number of results and/or encounters for the requested time period, some results have not been displayed. A complete set of results can be found in Results Review.   ---------------------------------------------------------------------------------------------------------------------------------------    Assessment / Plan:   Principal Problem:   Acute hypoxic on chronic hypercapnic respiratory failure (HCC) Active Problems:   Chronic interstitial lung disease (HCC)   Depression   Pneumonia   Essential hypertension   OSA on CPAP   Acquired hypothyroidism  Controlled type 2 diabetes mellitus with neuropathy (HCC)   PAD (peripheral artery disease) (HCC)   Anxiety   Chronic constipation   Iron deficiency anemia due to dietary causes   Mixed hyperlipidemia   GERD (gastroesophageal reflux disease)   Carotid artery disease (HCC)   Cervical spondylosis without myelopathy   Allergic rhinitis   PTSD (post-traumatic stress disorder)   Goals of care, counseling/discussion   Enrolled in home hospice care   Coronary artery disease   Bipolar disorder   DMII (diabetes mellitus, type 2) (HCC)   Assessment and Plan: * Acute hypoxic on chronic hypercapnic respiratory failure (HCC) - Patient will be admitted for acute on chronic respiratory failure to stepdown unit -Will continue BiPAP -Maintaining O2 sat 88-92% - Repeat ABG -Will obtain lactic acid, procalcitonin level -Continue with IV steroids, DuoNeb bronchodilators scheduled and as needed  -Currently patient is full code but on the hospice Continue  discussion with patient, consulting palliative care to determine goals of care and CODE STATUS   Chronic interstitial lung disease (HCC) - Chronic interstitial lung disease with COPD and chronic respiratory failure with obstructive sleep apnea on CPAP nightly and 4-5 L continuous O2 at home Currently in acute on chronic respiratory failure, on BiPAP Management as above  Pneumonia - Presumed pneumonia on chest x-ray, with a COPD exacerbation -Received IV Levaquin , will broaden antibiotics to cefepime -Will obtain procalcitonin level, lactic acid -Cultures blood and sputum  Depression History of depression/anxiety/bipolar - Polypharmacy review meds-try to modify current meds Which includes: Seroquel , Valium , BuSpar , Effexor ,  PAD (peripheral artery disease) (HCC) - Continue statin, aspirin   Controlled type 2 diabetes mellitus with neuropathy (HCC) - Steroid induced hyperglycemia-induced diabetes - Anticipating hyperglycemia on high-dose steroids, checking CBG q. ACHS with SSI coverage  Acquired hypothyroidism Continue Synthroid   OSA on CPAP Currently on BiPAP  Essential hypertension - Continue atenolol , as needed hydralazine   Bipolar disorder - Polypharmacy currently on Valium , BuSpar , Wellbutrin , Effexor  will--will review home medication and resume accordingly -Will try to avoid polypharmacy  Coronary artery disease - Continue statins beta-blockers, asa  Enrolled in home hospice care - Notifying hospice, consulting palliative care -Patient remains full code -Continue discussion regarding CODE STATUS and goals of care  It appears the patient is on home hospice    Goals of care, counseling/discussion Full code-continue discussion  PTSD (post-traumatic stress disorder) Polypharmacy continue as needed Valium   Allergic rhinitis As needed to help histamine, continue Singulair   Cervical spondylosis without myelopathy As needed analgesics  Carotid artery disease  (HCC) - Will continue atenolol , statins,  GERD (gastroesophageal reflux disease) - Continue PPI  Mixed hyperlipidemia Continue statins  Iron deficiency anemia due to dietary causes - Not on iron supplements, will monitor H&H  Chronic constipation - Will review, and resume bowel regimens   Anxiety - Continue Valium  at lower dose (at home 5 mg, will continue with 2 mg 3 times daily as needed every 6 hours)   Consults called:  TOC/palliative care -------------------------------------------------------------------------------------------------------------------------------------------- DVT prophylaxis:  heparin  injection 5,000 Units Start: 11/04/23 2200 SCDs Start: 11/04/23 1318   Code Status:   Code Status: Full Code   Admission status: Patient will be admitted as Inpatient, with a greater than 2 midnight length of stay. Level of care: Stepdown   Family Communication:  none at bedside  (The above findings and plan of care has been discussed with patient in detail, the patient expressed understanding and agreement of above plan)  --------------------------------------------------------------------------------------------------------------------------------------------------  Disposition Plan: >3 days Status is:  Inpatient Remains inpatient appropriate because: Needing respiratory support IV antibiotics, breathing treatments  -----------------------------------------------------------------------------------------------------------------  Critical care time spent:  68  Min.  Was spent seeing and evaluating the patient, reviewing all medical records, drawn plan of care.  SIGNED: Adriana DELENA Grams, MD, FHM. FAAFP. Critical care time 55 minutes [place] [facility], Pager  (Please use [email or web address] to page/ or secure chat through epic) If 7PM-7AM, please contact night-coverage [email or web address],  11/04/2023, 3:41 PM

## 2023-11-04 NOTE — Assessment & Plan Note (Signed)
 Continue PPI.

## 2023-11-04 NOTE — Progress Notes (Signed)
 Patient has been unable to urinate. Attempted to use BSC. Bladder scan - . MD has been made aware.

## 2023-11-04 NOTE — Assessment & Plan Note (Signed)
-   Will continue atenolol , statins,

## 2023-11-04 NOTE — Assessment & Plan Note (Addendum)
-   Steroid induced hyperglycemia-induced diabetes - Anticipating hyperglycemia on high-dose steroids, checking CBG q. ACHS with SSI coverage

## 2023-11-04 NOTE — Assessment & Plan Note (Signed)
As needed analgesics. 

## 2023-11-04 NOTE — Plan of Care (Signed)
   Brief Palliative Medicine Progress Note:  PMT consult received and chart reviewed. Goals of care completed, full note to follow.  Recommendations: Full code/full scope  Time for outcomes If respiratory status deteriorates update daughter/HCPOA [name]) who will act as health surrogate PMT will continue to follow   Thank you for allowing us  to participate in the care of Joan [name]  Signed by: Laymon Pinal, NP Palliative Medicine Team [facility] CHARGE  Team Phone # [phone] (Nights/Weekends)  11/04/2023, 4:49 PM

## 2023-11-04 NOTE — Progress Notes (Signed)
 Patient brought two vapes with her. Secured by security.

## 2023-11-04 NOTE — Assessment & Plan Note (Signed)
-   Continue Valium  at lower dose (at home 5 mg, will continue with 2 mg 3 times daily as needed every 6 hours)

## 2023-11-04 NOTE — Progress Notes (Signed)
   11/04/23 1132  BiPAP/CPAP/SIPAP  $ Non-Invasive Ventilator  Non-Invasive Vent Set Up;Non-Invasive Vent Initial  $ Face Mask Medium Yes  BiPAP/CPAP/SIPAP Pt Type Adult  BiPAP/CPAP/SIPAP SERVO  Mask Type Full face mask  Mask Size Medium  Set Rate 20 breaths/min  Respiratory Rate 37 breaths/min  Pressure Support 5 cmH20  PEEP 5 cmH20  FiO2 (%) 100 %  Minute Ventilation 19.3  Peak Inspiratory Pressure (PIP) 9  Tidal Volume (Vt) 638  Patient Home Machine No  Patient Home Mask No  Patient Home Tubing No  Auto Titrate No  Press High Alarm 25 cmH2O  Device Plugged into RED Power Outlet Yes  BiPAP/CPAP /SiPAP Vitals  Pulse Rate (!) 140  Resp (!) 37  SpO2 96 %  MEWS Score/Color  MEWS Score 6  MEWS Score Color Red

## 2023-11-04 NOTE — Assessment & Plan Note (Signed)
-   Chronic interstitial lung disease with COPD and chronic respiratory failure with obstructive sleep apnea on CPAP nightly and 4-5 L continuous O2 at home Currently in acute on chronic respiratory failure, on BiPAP Management as above

## 2023-11-04 NOTE — Assessment & Plan Note (Signed)
 As needed to help histamine, continue Singulair 

## 2023-11-04 NOTE — Assessment & Plan Note (Addendum)
 History of depression/anxiety/bipolar - Polypharmacy review meds-try to modify current meds Which includes: Seroquel , Valium , BuSpar , Effexor ,

## 2023-11-04 NOTE — Assessment & Plan Note (Signed)
 Continue Synthroid 

## 2023-11-04 NOTE — Assessment & Plan Note (Signed)
-   Polypharmacy currently on Valium , BuSpar , Wellbutrin , Effexor  will--will review home medication and resume accordingly -Will try to avoid polypharmacy

## 2023-11-04 NOTE — TOC Progression Note (Signed)
  Transition of Care [facility])  Brief Assessment   Patient Details  Name: Joan [name] MRN: [id number] Date of Birth: 1960-04-26  Transition of Care [facility]) CM/SW Contact:    Sharlyne Stabs, RN Phone Number: 11/04/2023, 2:04 PM  Transition of Care Asessment: Insurance and Status: Insurance coverage has been reviewed Patient has primary care physician: Yes Home environment has been reviewed: Home Prior level of function:: Hospice care Prior/Current Home Services: Current home services [name])   Readmission risk has been reviewed: Yes Transition of care needs: no transition of care needs at this time    Clinical Narrative:   Patient is ED, on BIPAP. TOC consulted to confirm code status. Patient is active with [facility] for Hospice service, CM spoke with Delon, hospice nurse. Patient is currently a full code, she is trying to reach Joan [name], patient's daughter to discuss code status and treatment plan. Team updated. TOC following.     Barriers to Discharge: Continued Medical Work up      Social Drivers of Health (SDOH) Interventions SDOH Screenings   Food Insecurity: No Food Insecurity (09/24/2023)  Housing: Unknown (09/24/2023)  Transportation Needs: No Transportation Needs (09/24/2023)  Utilities: Not At Risk (09/24/2023)  Alcohol Screen: Low Risk  (09/24/2023)  Depression (PHQ2-9): Medium Risk (09/24/2023)  Financial Resource Strain: Low Risk  (09/24/2023)  Recent Concern: Financial Resource Strain - Medium Risk (07/28/2023)  Physical Activity: Inactive (09/24/2023)  Social Connections: Socially Isolated (09/24/2023)  Stress: Stress Concern Present (09/24/2023)  Tobacco Use: Medium Risk (11/04/2023)  Health Literacy: Adequate Health Literacy (09/24/2023)    Readmission Risk Interventions    01/31/2023    2:52 PM 09/13/2022   10:53 AM  Readmission Risk Prevention Plan  Transportation Screening Complete Complete  HRI or Home Care Consult  Complete  Social Work Consult for Recovery  Care Planning/Counseling  Complete  Palliative Care Screening  Not Applicable  Medication Review [facility]) Complete Complete  HRI or Home Care Consult Complete   SW Recovery Care/Counseling Consult Complete   Palliative Care Screening Complete   Skilled Nursing Facility Not Applicable

## 2023-11-04 NOTE — Assessment & Plan Note (Signed)
-   Notifying hospice, consulting palliative care -Patient remains full code -Continue discussion regarding CODE STATUS and goals of care  It appears the patient is on home hospice

## 2023-11-04 NOTE — Assessment & Plan Note (Signed)
-   Presumed pneumonia on chest x-ray, with a COPD exacerbation -Received IV Levaquin , will broaden antibiotics to cefepime -Will obtain procalcitonin level, lactic acid -Cultures blood and sputum

## 2023-11-04 NOTE — Assessment & Plan Note (Signed)
 Full code-continue discussion

## 2023-11-04 NOTE — ED Notes (Signed)
 Per MD, due to the difficulty of venous access, one set of cultures is okay before antibiotics are initiated.

## 2023-11-04 NOTE — ED Notes (Addendum)
 Pt attempted to pull bipap off multiple times while at bedside. Pt notified that she can not have water  despite asking multiple times. Pt states she just can't breathe with the bipap on. Provider notified of pt's mentation and attempts to remove BIPAP. Second nurse also reiterated to leave BIPAP on.

## 2023-11-04 NOTE — Assessment & Plan Note (Signed)
 Currently on BiPAP

## 2023-11-04 NOTE — Assessment & Plan Note (Signed)
-   Patient will be admitted for acute on chronic respiratory failure to stepdown unit -Will continue BiPAP -Maintaining O2 sat 88-92% - Repeat ABG -Will obtain lactic acid, procalcitonin level -Continue with IV steroids, DuoNeb bronchodilators scheduled and as needed  -Currently patient is full code but on the hospice Continue discussion with patient, consulting palliative care to determine goals of care and CODE STATUS

## 2023-11-04 NOTE — Assessment & Plan Note (Addendum)
-   Continue atenolol , as needed hydralazine

## 2023-11-04 NOTE — Consult Note (Signed)
 Consultation Note Date: 11/04/2023   Patient Name: Joan [name]  DOB: September 07, 1960  MRN: [id number]  Age / Sex: 63 y.o., female  PCP: Rollene Almarie LABOR, MD Referring Physician: Willette Adriana LABOR, MD  Reason for Consultation: Establishing goals of care  HPI/Patient Profile: 63 y.o. female  with past medical history of *** admitted on 11/04/2023 with ***.     PMT has been consulted to assist with goals of care conversation.  Clinical Assessment and Goals of Care:  I have reviewed medical records including EPIC notes, labs and imaging (independently reviewed), ***, assessed the patient and then *** with *** to discuss diagnosis prognosis, GOC, EOL wishes, disposition and options. Collaborated directly with ***.   I introduced Palliative Medicine as specialized medical care for people living with serious illness. It focuses on providing relief from the symptoms and stress of a serious illness. The goal is to improve quality of life for both the patient and the family.  We discussed a brief life review of the patient and then focused on their current illness. ***  I attempted to elicit values and goals of care important to the patient.    Medical History Review and Family/Patient Understanding:   ***  Social History:  ***  Functional and Nutritional State:  ***  Palliative Symptoms:  ***  Advance Directives/Goals of Care/Anticipatory care planning Discussion:  A detailed discussion regarding GOC, advanced directives, and anticipatory care planning was had. ***    The difference between aggressive medical intervention and comfort care was considered in light of the patient's goals of care. Hospice and Palliative Care services outpatient were explained and offered.   Discussed the importance of continued conversation with family and the medical providers regarding overall plan of care and treatment options, ensuring decisions are within the  context of the patient's values and GOCs.   Questions and concerns were addressed.  Hard Choices booklet left for review. The family was encouraged to call with questions or concerns.  PMT will continue to support holistically.  Primary Decision maker and health care surrogate:  {Primary Decision Fjxzm:78612}  Code Status:  ***    SUMMARY OF RECOMMENDATIONS   *** Code Status/Advance Care Planning: {Palliative Code status:23503}   Symptom Management:  ***  Palliative Prophylaxis:  {Palliative Prophylaxis:21015}  Additional Recommendations (Limitations, Scope, Preferences): {Recommended Scope and Preferences:21019}  Psycho-social/Spiritual:  Desire for further Chaplaincy support:{YES NO:22349} Additional Recommendations: {PAL SOCIAL:21064}  Prognosis:  {Palliative Care Prognosis:23504}  Discharge Planning: {Palliative dispostion:23505}      Primary Diagnoses: Present on Admission:  Acute hypoxic on chronic hypercapnic respiratory failure (HCC)  Acquired hypothyroidism  Allergic rhinitis  Anxiety  Bipolar disorder  Carotid artery disease (HCC)  Cervical spondylosis without myelopathy  Chronic constipation  Chronic interstitial lung disease (HCC)  Controlled type 2 diabetes mellitus with neuropathy (HCC)  Coronary artery disease  Depression  Essential hypertension  GERD (gastroesophageal reflux disease)  Iron deficiency anemia due to dietary causes  Mixed hyperlipidemia  (Resolved) Neuropathy due to type 2 diabetes mellitus (HCC)  PAD (peripheral artery disease) (HCC)  Pneumonia  PTSD (post-traumatic stress disorder)    Physical Exam  Vital Signs: BP (!) 157/87   Pulse (!) 138   Temp 98.2 F (36.8 C) (Axillary)   Resp (!) 26   Ht 5' 6 (1.676 m)   Wt 95.7 kg   SpO2 99%   BMI 34.06 kg/m  Pain Scale: 0-10   Pain Score: 8    SpO2: SpO2: 99 % O2  Device:SpO2: 99 % O2 Flow Rate: .    Palliative Assessment/Data:    Total time: I spent  *** minutes in the care of the patient today in the above activities and documenting the encounter.  Billing based on MDM: ***  {Problems Addressed:304933}  {Amount and/or Complexity of Ijuj:695065}  {Risks:304936}   Laymon CHRISTELLA Pinal, NP  Palliative Medicine Team Team phone # [phone]  Thank you for allowing the Palliative Medicine Team to assist in the care of this patient. Please utilize secure chat with additional questions, if there is no response within 30 minutes please call the above phone number.  Palliative Medicine Team providers are available by phone from 7am to 7pm daily and can be reached through the team cell phone.  Should this patient require assistance outside of these hours, please call the patient's attending physician.

## 2023-11-04 NOTE — Assessment & Plan Note (Signed)
 Continue statins

## 2023-11-04 NOTE — Assessment & Plan Note (Signed)
Continue statin, aspirin 

## 2023-11-04 NOTE — Sepsis Progress Note (Signed)
 Code sepsis protocol being monitored by eLink.

## 2023-11-04 NOTE — ED Provider Notes (Signed)
 [place] EMERGENCY DEPARTMENT AT [facility] Provider Note   CSN: [id number] Arrival date & time: 11/04/23  1129     Patient presents with: Respiratory Distress   Joan [name] is a 63 y.o. female.   HPI     63 year old female on home hospice care for end-stage terminal COPD lung disease, chronic interstitial lung disease, chronic hypoxic respiratory failure oxygen  dependent on 4-5 L/min supplemental oxygen , coronary artery disease, frequent hospitalizations for COPD exacerbations comes in from home for chief complaint of acute shortness of breath.  According to EMS, they received a call from patient.  Patient lives by herself.  She chronically on oxygen .  When they arrived, patient was in respiratory distress.  She had O2 sats in the 70s on her home 4 L.  They gave patient breathing treatments, and O2 sats rose to 78 to 81%.  Ultimately they put her on BiPAP.  Patient states that has a new cough.  Her shortness of breath, however acutely got worse today.  Patient denies any cardiac disease history. Later on inpatient stay, we received a call from hospice team.  They indicated that patient has been with hospice for his COPD.  Patient is full code.  They did not receive a call from patient indicating that she was worsening.  Prior to Admission medications   Medication Sig Start Date End Date Taking? Authorizing Provider  albuterol  (VENTOLIN  HFA) 108 (90 Base) MCG/ACT inhaler Inhale 2 puffs into the lungs every 6 (six) hours as needed for wheezing or shortness of breath. 09/16/23  Yes Ruthell Lauraine FALCON, NP  atenolol  (TENORMIN ) 25 MG tablet TAKE 1 TABLET BY MOUTH DAILY 05/15/23  Yes Rollene Almarie LABOR, MD  atorvastatin  (LIPITOR) 20 MG tablet TAKE 1 TABLET BY MOUTH ONCE  DAILY 03/10/23  Yes Rollene Almarie LABOR, MD  budesonide -glycopyrrolate-formoterol  (BREZTRI  AEROSPHERE) 160-9-4.8 MCG/ACT AERO inhaler Inhale 2 puffs into the lungs 2 (two) times daily. 10/16/23  Yes Jude Harden GAILS, MD  buPROPion  (WELLBUTRIN  XL) 300 MG 24 hr tablet TAKE 1 TABLET BY MOUTH DAILY 06/11/23  Yes Geralene Kaiser, MD  busPIRone  (BUSPAR ) 5 MG tablet Take 1 tablet (5 mg total) by mouth daily. 10/15/23  Yes Geralene Kaiser, MD  diazepam  (VALIUM ) 5 MG tablet Take 1 tablet (5 mg total) by mouth every 6 (six) hours as needed for anxiety. 12/07/22  Yes Johnson, Clanford L, MD  guaiFENesin -dextromethorphan  (ROBITUSSIN DM) 100-10 MG/5ML syrup Take 10 mLs by mouth every 8 (eight) hours. 02/02/23  Yes Shahmehdi, Seyed A, MD  ipratropium-albuterol  (DUONEB) 0.5-2.5 (3) MG/3ML SOLN Take 3 mLs by nebulization every 4 (four) hours as needed (shortness of breath). 05/20/23  Yes [provider]  levothyroxine  (SYNTHROID ) 25 MCG tablet TAKE 1 TABLET BY MOUTH DAILY  BEFORE BREAKFAST 08/14/23  Yes Rollene Almarie LABOR, MD  oxyCODONE -acetaminophen  (PERCOCET/ROXICET) 5-325 MG tablet Take 1-2 tablets by mouth every 6 (six) hours as needed for severe pain (pain score 7-10).   Yes [provider]  OXYGEN  Inhale 4 L into the lungs continuous. Inhale  4 L/min of oxygen  into the lungs w/CPAP at bedtime and as needed for shortness of breath with strenuous activity during the day   Yes [provider]  pantoprazole  (PROTONIX ) 40 MG tablet TAKE 1 TABLET BY MOUTH DAILY 08/13/23  Yes Rollene Almarie LABOR, MD  PATADAY  0.2 % SOLN Apply 1 drop to eye daily. 05/16/23  Yes [provider]  polyethylene glycol (MIRALAX  / GLYCOLAX ) 17 g packet Take 17 g  by mouth daily as needed for mild constipation or moderate constipation.   Yes [provider]  pregabalin  (LYRICA ) 300 MG capsule Take 1 capsule (300 mg total) by mouth 2 (two) times daily. Take 2 tablets (600 mg) daily 08/15/23  Yes Johnson, Clanford L, MD  QUEtiapine  150 MG TABS Take 150 mg by mouth at bedtime. 10/15/23  Yes Geralene Kaiser, MD  roflumilast  (DALIRESP ) 500 MCG TABS tablet Take 1 tablet (500 mcg total) by mouth daily. 04/02/22  Yes  Ruthell Lauraine FALCON, NP  tamsulosin  (FLOMAX ) 0.4 MG CAPS capsule Take 1 capsule (0.4 mg total) by mouth daily. 08/15/23  Yes Johnson, Clanford L, MD  venlafaxine  XR (EFFEXOR -XR) 150 MG 24 hr capsule TAKE 1 CAPSULE BY MOUTH DAILY  WITH BREAKFAST 08/13/23  Yes Rollene Almarie LABOR, MD  ACCU-CHEK GUIDE TEST test strip USE TO CHECK BLOOD SUGAR IN THE  MORNING AT [facility] AND AT BEDTIME 08/04/23   Rollene Almarie LABOR, MD  albuterol  (PROVENTIL ) (2.5 MG/3ML) 0.083% nebulizer solution Take 2.5 mg by nebulization 2 (two) times daily. Patient not taking: Reported on 11/04/2023 02/28/23   [provider]  fluticasone  (FLONASE ) 50 MCG/ACT nasal spray Place 2 sprays into both nostrils daily as needed for allergies. 12/07/22 09/24/23  Johnson, Clanford L, MD  furosemide  (LASIX ) 20 MG tablet Take 1 tablet (20 mg total) by mouth daily. Patient not taking: Reported on 11/04/2023 08/15/23   Johnson, Clanford L, MD  MELATONIN PO Take 10 mg by mouth at bedtime as needed (sleep).    [provider]  metformin  (FORTAMET ) 1000 MG (OSM) 24 hr tablet Take 1 tablet (1,000 mg total) by mouth daily with breakfast. 02/02/23 11/04/23  Shahmehdi, Adriana LABOR, MD  metFORMIN  (GLUCOPHAGE ) 500 MG tablet Take 500 mg by mouth daily. 10/10/23   [provider]  methylPREDNISolone  (MEDROL  DOSEPAK) 4 MG TBPK tablet Begin taking as per package instructions 10/24/23   Geroldine Berg, MD  montelukast  (SINGULAIR ) 10 MG tablet TAKE 1 TABLET BY MOUTH AT  BEDTIME 03/08/23   Jude Harden GAILS, MD  traZODone  (DESYREL ) 100 MG tablet Take 100 mg by mouth at bedtime. Patient not taking: Reported on 11/04/2023    [provider]  Wound Dressings (DRS CHOICE DIABETIC BANDAGES) KIT Apply 8 kits topically 3 (three) times daily before meals. 02/02/23   Willette Adriana LABOR, MD    Allergies: Keflex [cephalexin], Shellfish allergy, Tetracyclines & related, Dilaudid  [hydromorphone  hcl], Prednisone , Clindamycin /lincomycin, Incruse ellipta  [umeclidinium  bromide], Molds & smuts, Tuberculin tests, and Umeclidinium    Review of Systems  All other systems reviewed and are negative.   Updated Vital Signs BP (!) 157/87   Pulse (!) 138   Temp 98.2 F (36.8 C) (Axillary)   Resp (!) 26   Ht 5' 6 (1.676 m)   Wt 95.7 kg   SpO2 99%   BMI 34.06 kg/m   Physical Exam Vitals and nursing note reviewed.  Constitutional:      General: She is in acute distress.     Appearance: She is well-developed.  HENT:     Head: Atraumatic.  Cardiovascular:     Rate and Rhythm: Normal rate.  Pulmonary:     Effort: Respiratory distress present.     Breath sounds: Wheezing present.  Musculoskeletal:        General: No swelling or tenderness.     Cervical back: Neck supple.     Right lower leg: No edema.     Left lower leg: No edema.  Skin:  General: Skin is warm and dry.  Neurological:     Mental Status: She is alert and oriented to person, place, and time.     (all labs ordered are listed, but only abnormal results are displayed) Labs Reviewed  BASIC METABOLIC PANEL WITH GFR - Abnormal; Notable for the following components:      Result Value   Glucose, Bld 146 (*)    All other components within normal limits  CBC WITH DIFFERENTIAL/PLATELET - Abnormal; Notable for the following components:   WBC 24.5 (*)    RBC 5.15 (*)    HCT 48.3 (*)    MCHC 29.8 (*)    Neutro Abs 22.1 (*)    Abs Immature Granulocytes 0.12 (*)    All other components within normal limits  BLOOD GAS, VENOUS - Abnormal; Notable for the following components:   pCO2, Ven 78 (*)    Bicarbonate 40.7 (*)    Acid-Base Excess 11.1 (*)    All other components within normal limits  BLOOD GAS, VENOUS - Abnormal; Notable for the following components:   pCO2, Ven 75 (*)    Bicarbonate 42.0 (*)    Acid-Base Excess 12.8 (*)    All other components within normal limits  CBC - Abnormal; Notable for the following components:   WBC 21.6 (*)    HCT 46.9 (*)    All other components  within normal limits  MAGNESIUM  - Abnormal; Notable for the following components:   Magnesium  1.6 (*)    All other components within normal limits  CBG MONITORING, ED - Abnormal; Notable for the following components:   Glucose-Capillary 138 (*)    All other components within normal limits  RESP PANEL BY RT-PCR (RSV, FLU A&B, COVID)  RVPGX2  CULTURE, BLOOD (ROUTINE X 2)  CULTURE, BLOOD (ROUTINE X 2)  EXPECTORATED SPUTUM ASSESSMENT W GRAM STAIN, RFLX TO RESP C  EXPECTORATED SPUTUM ASSESSMENT W GRAM STAIN, RFLX TO RESP C  MRSA NEXT GEN BY PCR, NASAL  BRAIN NATRIURETIC PEPTIDE  LACTIC ACID, PLASMA  PHOSPHORUS  PROTIME-INR  PROCALCITONIN  LACTIC ACID, PLASMA  HIV ANTIBODY (ROUTINE TESTING W REFLEX)    EKG: EKG Interpretation Date/Time:  Tuesday November 04 2023 11:36:28 EDT Ventricular Rate:  138 PR Interval:  136 QRS Duration:  112 QT Interval:  284 QTC Calculation: 431 R Axis:   131  Text Interpretation: Sinus tachycardia Ventricular premature complex Borderline intraventricular conduction delay Low voltage, precordial leads Abnormal R-wave progression, late transition Abnormal T, consider ischemia, lateral leads Minimal ST elevation, lateral leads artifact makes interpretation difficult, but no acute changes noted Confirmed by Charlyn Sora ([id number]) on 11/04/2023 12:40:59 PM  Radiology: [facility] Chest Port 1 View Result Date: 11/04/2023 EXAM: 1 VIEW XRAY OF THE CHEST 11/04/2023 12:13:00 PM COMPARISON: 10/23/2023 CLINICAL HISTORY: Cough, shortness of breath. Per triage: Patient brought in by EMS from home complaining of respiratory distress, on 4L O2 at home, sats 70% upon EMS arrival, up to 94% on CPAP; patient given 3 albuterol  and 2 duonebs en route; decreased lower lung sounds, rhonchi and wheezing noted by EMS, ST on monitor, BP 180/70 en route; history of COPD, EDP and RT at bedside upon arrival. FINDINGS: LUNGS AND PLEURA: Worsened bibasilar interstitial opacities. Possible trace  pleural effusions. No pneumothorax. HEART AND MEDIASTINUM: No acute abnormality of the cardiac and mediastinal silhouettes. BONES AND SOFT TISSUES: No acute osseous abnormality. IMPRESSION: 1. Worsened bibasilar opacities, which is favored to represent interstitial edema. Superimposed infection not excluded. 2. Possible  trace pleural effusions. Electronically signed by: Waddell Calk MD 11/04/2023 12:56 PM EDT RP Workstation: [id number]     .Critical Care  Performed by: Charlyn Sora, MD Authorized by: Charlyn Sora, MD   Critical care provider statement:    Critical care time (minutes):  48   Critical care time was exclusive of:  Separately billable procedures and treating other patients   Critical care was necessary to treat or prevent imminent or life-threatening deterioration of the following conditions:  Respiratory failure and sepsis   Critical care was time spent personally by me on the following activities:  Development of treatment plan with patient or surrogate, discussions with consultants, evaluation of patient's response to treatment, examination of patient, ordering and review of laboratory studies, ordering and review of radiographic studies, ordering and performing treatments and interventions, pulse oximetry, re-evaluation of patient's condition, review of old charts and obtaining history from patient or surrogate    Medications Ordered in the ED  levofloxacin  (LEVAQUIN ) IVPB 750 mg (750 mg Intravenous New Bag/Given 11/04/23 1315)  heparin  injection 5,000 Units (has no administration in time range)  sodium chloride  flush (NS) 0.9 % injection 3 mL (has no administration in time range)  sodium chloride  flush (NS) 0.9 % injection 3 mL (has no administration in time range)  acetaminophen  (TYLENOL ) tablet 650 mg (has no administration in time range)    Or  acetaminophen  (TYLENOL ) suppository 650 mg (has no administration in time range)  oxyCODONE  (Oxy IR/ROXICODONE ) immediate  release tablet 5 mg (has no administration in time range)  ondansetron  (ZOFRAN ) tablet 4 mg (has no administration in time range)    Or  ondansetron  (ZOFRAN ) injection 4 mg (has no administration in time range)  ipratropium (ATROVENT ) nebulizer solution 0.5 mg (0.5 mg Nebulization Given 11/04/23 1415)  levalbuterol  (XOPENEX ) nebulizer solution 0.63 mg (0.63 mg Nebulization Given 11/04/23 1415)  hydrALAZINE  (APRESOLINE ) injection 10 mg (has no administration in time range)  insulin  aspart (novoLOG ) injection 0-20 Units (has no administration in time range)  senna-docusate (Senokot-S) tablet 1 tablet (has no administration in time range)  albuterol  (PROVENTIL ) (2.5 MG/3ML) 0.083% nebulizer solution 10 mg (10 mg Nebulization Given 11/04/23 1142)  methylPREDNISolone  sodium succinate (SOLU-MEDROL ) 125 mg/2 mL injection 125 mg (125 mg Intravenous Given 11/04/23 1152)                                    Medical Decision Making Amount and/or Complexity of Data Reviewed Labs: ordered. Radiology: ordered.  Risk Prescription drug management. Decision regarding hospitalization.  63 year old patient comes in with chief complaint of acute shortness of breath.  She has history of COPD, chronic hypoxic respiratory failure and is under hospice care.  She is full code.  Collateral history provided by hospice team, EMS and I reviewed patient's records including discharge summary.  Last admission was about 3 months ago.  On exam, patient arrives with respiratory distress and diffuse wheezing.  Differential diagnosis for this patient includes acute COPD exacerbation, acute pulmonary edema, pneumonia, pleural effusion, pulmonary embolism.  Basic labs ordered, x-ray of the chest ordered.  Patient placed on BiPAP.  Hour-long breathing treatments ordered.  Reassessment: On reassessment, patient appears more calm.  She is still on BiPAP.  She is not tolerating BiPAP well.  I advised patient that she has hypercapnic  respiratory failure, that the BiPAP is not only helping her with the ventilation but also helping her oxygenate better.  We need her to stay on the BiPAP until we know that she is compensating well.  I have changed the BiPAP setting to 15/5.  I have ordered repeat VBG. Patient's white count is elevated over 20,000.  We will activate code sepsis and give her Levaquin . Last CT PE was December 2024.  No signs of DVT.  Right now we will focus on pneumonia and COPD exacerbation as the cause for her symptoms.  I consulted medicine team and they will admit the patient.  Reassessment: Patient is off the floor, her repeat venous blood gas however is slightly improved.  pH is 7.35 now, pCO2 is going in the right direction.  Likely best to keep her on BiPAP a little longer.  Final diagnoses:  Acute hypercapnic respiratory failure (HCC)  Acute on chronic respiratory failure with hypoxemia (HCC)  Community acquired pneumonia, unspecified laterality  Severe sepsis (HCC)  COPD exacerbation [facility])    ED Discharge Orders     None          Charlyn Sora, MD 11/04/23 1432

## 2023-11-05 ENCOUNTER — Other Ambulatory Visit (HOSPITAL_COMMUNITY): Payer: Self-pay | Admitting: Psychiatry

## 2023-11-05 DIAGNOSIS — Z66 Do not resuscitate: Secondary | ICD-10-CM | POA: Diagnosis not present

## 2023-11-05 DIAGNOSIS — F319 Bipolar disorder, unspecified: Secondary | ICD-10-CM | POA: Diagnosis not present

## 2023-11-05 DIAGNOSIS — J9601 Acute respiratory failure with hypoxia: Secondary | ICD-10-CM | POA: Diagnosis not present

## 2023-11-05 DIAGNOSIS — J9612 Chronic respiratory failure with hypercapnia: Secondary | ICD-10-CM

## 2023-11-05 DIAGNOSIS — Z7189 Other specified counseling: Secondary | ICD-10-CM | POA: Diagnosis not present

## 2023-11-05 DIAGNOSIS — E782 Mixed hyperlipidemia: Secondary | ICD-10-CM | POA: Diagnosis not present

## 2023-11-05 DIAGNOSIS — E119 Type 2 diabetes mellitus without complications: Secondary | ICD-10-CM | POA: Diagnosis not present

## 2023-11-05 DIAGNOSIS — E039 Hypothyroidism, unspecified: Secondary | ICD-10-CM

## 2023-11-05 DIAGNOSIS — J3089 Other allergic rhinitis: Secondary | ICD-10-CM

## 2023-11-05 DIAGNOSIS — Z515 Encounter for palliative care: Secondary | ICD-10-CM | POA: Diagnosis not present

## 2023-11-05 DIAGNOSIS — K5909 Other constipation: Secondary | ICD-10-CM

## 2023-11-05 DIAGNOSIS — G4733 Obstructive sleep apnea (adult) (pediatric): Secondary | ICD-10-CM

## 2023-11-05 LAB — BASIC METABOLIC PANEL WITH GFR
Anion gap: 10 (ref 5–15)
BUN: 18 mg/dL (ref 8–23)
CO2: 29 mmol/L (ref 22–32)
Calcium: 8.1 mg/dL — ABNORMAL LOW (ref 8.9–10.3)
Chloride: 99 mmol/L (ref 98–111)
Creatinine, Ser: 0.93 mg/dL (ref 0.44–1.00)
GFR, Estimated: >60 mL/min (ref >60)
Glucose, Bld: 239 mg/dL — ABNORMAL HIGH (ref 70–99)
Potassium: 3.9 mmol/L (ref 3.5–5.1)
Sodium: 138 mmol/L (ref 135–145)

## 2023-11-05 LAB — GLUCOSE, CAPILLARY
Glucose-Capillary: 127 mg/dL — ABNORMAL HIGH (ref 70–99)
Glucose-Capillary: 176 mg/dL — ABNORMAL HIGH (ref 70–99)
Glucose-Capillary: 182 mg/dL — ABNORMAL HIGH (ref 70–99)
Glucose-Capillary: 260 mg/dL — ABNORMAL HIGH (ref 70–99)
Glucose-Capillary: 279 mg/dL — ABNORMAL HIGH (ref 70–99)

## 2023-11-05 MED ORDER — CHLORHEXIDINE GLUCONATE CLOTH 2 % EX PADS
6.0000 | MEDICATED_PAD | Freq: Every day | CUTANEOUS | Status: DC
  Administered 2023-11-05 – 2023-11-10 (×6): 6 via TOPICAL

## 2023-11-05 MED ORDER — LACTATED RINGERS IV BOLUS
500.0000 mL | Freq: Once | INTRAVENOUS | Status: AC
  Administered 2023-11-05: 500 mL via INTRAVENOUS

## 2023-11-05 MED ORDER — LINEZOLID 600 MG PO TABS
600.0000 mg | ORAL_TABLET | Freq: Two times a day (BID) | ORAL | Status: AC
  Administered 2023-11-05 – 2023-11-09 (×10): 600 mg via ORAL
  Filled 2023-11-05 (×13): qty 1

## 2023-11-05 MED ORDER — INSULIN GLARGINE 100 UNIT/ML ~~LOC~~ SOLN
15.0000 [IU] | Freq: Every day | SUBCUTANEOUS | Status: DC
  Administered 2023-11-05: 15 [IU] via SUBCUTANEOUS
  Filled 2023-11-05 (×3): qty 0.15

## 2023-11-05 MED ORDER — FUROSEMIDE 20 MG PO TABS
20.0000 mg | ORAL_TABLET | Freq: Every day | ORAL | Status: DC
Start: 2023-11-05 — End: 2023-11-08
  Administered 2023-11-05 – 2023-11-07 (×3): 20 mg via ORAL
  Filled 2023-11-05 (×3): qty 1

## 2023-11-05 MED ORDER — BUSPIRONE HCL 5 MG PO TABS
5.0000 mg | ORAL_TABLET | Freq: Every day | ORAL | Status: DC
  Administered 2023-11-05 – 2023-11-09 (×5): 5 mg via ORAL
  Filled 2023-11-05 (×5): qty 1

## 2023-11-05 MED ORDER — INSULIN ASPART 100 UNIT/ML IJ SOLN
5.0000 [IU] | Freq: Three times a day (TID) | INTRAMUSCULAR | Status: DC
  Administered 2023-11-05: 5 [IU] via SUBCUTANEOUS

## 2023-11-05 MED ORDER — MELATONIN 3 MG PO TABS
6.0000 mg | ORAL_TABLET | Freq: Every day | ORAL | Status: DC
  Administered 2023-11-05 – 2023-11-09 (×5): 6 mg via ORAL
  Filled 2023-11-05 (×5): qty 2

## 2023-11-05 MED ORDER — VENLAFAXINE HCL ER 75 MG PO CP24
150.0000 mg | ORAL_CAPSULE | Freq: Every day | ORAL | Status: DC
  Administered 2023-11-06 – 2023-11-10 (×5): 150 mg via ORAL
  Filled 2023-11-05 (×5): qty 2

## 2023-11-05 MED ORDER — ACYCLOVIR 800 MG PO TABS
400.0000 mg | ORAL_TABLET | Freq: Two times a day (BID) | ORAL | Status: DC
  Administered 2023-11-05 – 2023-11-10 (×11): 400 mg via ORAL
  Filled 2023-11-05 (×11): qty 1

## 2023-11-05 MED ORDER — MONTELUKAST SODIUM 10 MG PO TABS
10.0000 mg | ORAL_TABLET | Freq: Every day | ORAL | Status: DC
  Administered 2023-11-05 – 2023-11-09 (×5): 10 mg via ORAL
  Filled 2023-11-05 (×5): qty 1

## 2023-11-05 MED ORDER — ATORVASTATIN CALCIUM 20 MG PO TABS
20.0000 mg | ORAL_TABLET | Freq: Every day | ORAL | Status: DC
  Administered 2023-11-05 – 2023-11-10 (×6): 20 mg via ORAL
  Filled 2023-11-05 (×6): qty 1

## 2023-11-05 NOTE — Inpatient Diabetes Management (Signed)
 Inpatient Diabetes Program Recommendations  AACE/ADA: New Consensus Statement on Inpatient Glycemic Control   Target Ranges:  Prepandial:   less than 140 mg/dL      Peak postprandial:   less than 180 mg/dL (1-2 hours)      Critically ill patients:  140 - 180 mg/dL     Latest Reference Range & Units 11/04/23 11:37 11/04/23 16:29 11/04/23 21:11 11/05/23 07:37  Glucose-Capillary 70 - 99 mg/dL 861 (H) 754 (H) 787 (H) 182 (H)    Latest Reference Range & Units 11/04/23 11:50 11/05/23 03:38  Glucose 70 - 99 mg/dL 853 (H) 760 (H)   Review of Glycemic Control  Diabetes history: DM2 Outpatient Diabetes medications: Metformin  500 mg QAM Current orders for Inpatient glycemic control: Novolog  0-20 units TID with meals; Solumedrol 40 mg Q8H  Inpatient Diabetes Program Recommendations:    Insulin : Please consider changing Regular diet to Carb Modified.  Please add Novolog  0-5 units at bedtime for bedtime correction.  If steroids are continued as ordered, please consider ordering Novolog  3 units TID with meals for meal coverage if patient eats at least 50% of meals.  Thanks, Earnie Gainer, RN, MSN, [facility] Diabetes Coordinator Inpatient Diabetes Program [phone] (Team Pager from 8am to 5pm)

## 2023-11-05 NOTE — Progress Notes (Signed)
 Daily Progress Note   Patient Name: Joan [name]       Date: 11/05/2023 DOB: 1960/05/19  Age: 63 y.o. MRN#: [id number] Attending Physician: Vicci Afton CROME, MD Primary Care Physician: Rollene Almarie LABOR, MD Admit Date: 11/04/2023  Reason for Consultation/Follow-up: Establishing goals of care  Subjective:   63 y.o. female  with numerous chronic medical problems including but not limited end-stage terminal COPD, chronic interstitial lung disease, chronic hypoxic respiratory failure-on O2 demand~5 L, CAD, h/o tobacco abuse, chest pains, HTN, anxiety, bipolar disorder, hypothyroidism, DM2, neuropathy, GERD, HLD, obesity, OSA on CPAP, polypharmacy, PTSD, multiple hospitalizations  admitted on 11/04/2023 with shortness of breath and hypoxia.    Patient admitted for acute on chronic respiratory failure, COPD exacerbation, and possible pneumonia.  She required BiPAP therapy in the emergency department and was admitted to ICU.  Started on IV steroids, bronchodilators, and IV antibiotics.  It is noted that she is a home hospice patient but was listed as full code.  11/04/2023: Initial completed, extensive goals of care and advance care planning discussion held with patient and her daughter.  Patient expressed desire to transition to DNR/DNI, treat the treatable for 24 to 48 hours, and transition to comfort for any declines or if not improving.  However, she expressed she wanted me to follow-up with her daughter for making any decisions.  Followed up with daughter she wanted to talk with her mother and requested she remain full code/full scope until she was able to confirm this decision.  Today, labs independently reviewed.  BMP appears stable.  Glucose elevated however, patient has known type 2 diabetes and is on IV steroids.  CBC reviewed.  White  blood cell count trending up with a reading of 27.8 today compared to 21.6 on yesterday.  Sputum culture pending.  Patient remains on IV Levaquin  for pneumonia coverage.  Hemoglobin also downtrending from 14.2 g/dL yesterday to 89.4 g/dL today.  Would recommend monitoring.  Vital signs reviewed and appear relatively stable.  SpO2 maintained with BiPAP and supplemental O2 via nasal cannula at 3 L.  Medication administration record reviewed.  She has received a total of 10 mg of oral Valium  on 24-hour look back (2 doses).  She has also received a total of 15 mg of oral oxycodone  on 24-hour look back.  Otherwise, no symptom meds appear to have been required.  Today patient states she is doing perhaps a little bit better.  She states that she continues to have significant shortness of breath with minimal exertion.  She reports her pain is a little bit better than yesterday but cannot rate  it.  Extensive discussion completed with patient, daughter who is present via phone for discussion, and patient's hospice social worker who was present at bedside.  Discussed summary of our conversation from yesterday including our concerns regarding the seriousness of her current condition and that she is in end-stage COPD and prognosis is limited.  Discussed/confirmed that her goals are to pass away peacefully and comfortably and to avoid suffering.  We discussed the limitations and potential burdens of CPR and intubation particularly in the context of end-stage COPD and that if her goal is to die comfortably, peacefully, and naturally then it would be appropriate to consider DNR/DNI.  Patient and her daughter state that they have considered this and agreed to transition to DNR/DNI.  We then discussed next steps.  In light of her goals of care, offered to transition to full comfort measures and discontinue non comfort interventions/medications.  We also discussed alternative of continuing aggressive measures for the time being  including IV steroids, IV antibiotics, and O2 support with either nasal cannula or BiPAP for the next 24 hours and assess response at that time to determine whether or not to transition to comfort.  Patient states that she would like to continue current plan but if no improvement in the next 24 hours or if her condition decompensates, she would like to focus solely on comfort care.  Patient's daughter in agreement with this plan.  [employer] form completed. Reviewed the MOST form in detail with the patient and daughter, including how its components reflect her care preferences. Documentation was finalized and scanned into the electronic medical record. Daughter also expressed agreement.  Patient remains hopeful that she can improve enough to return home with hospice support.  Both patient and her daughter understand that prognosis is poor.  While patient is not religious she does believe in [name] and is interested in having spiritual care consult for additional support.   I completed a MOST form today. The patient and family outlined their wishes for the following treatment decisions:  Cardiopulmonary Resuscitation: Do Not Attempt Resuscitation (DNR/No CPR)  Medical Interventions: Limited Additional Interventions: Use medical treatment, IV fluids and cardiac monitoring as indicated, DO NOT USE intubation or mechanical ventilation. May consider use of less invasive airway support such as BiPAP or CPAP. Also provide comfort measures. Transfer to the hospital if indicated. Avoid intensive care.   Antibiotics: Determine use of limitation of antibiotics when infection occurs  IV Fluids: IV fluids for a defined trial period  Feeding Tube: No feeding tube     Chart review/care coordination:  Completed extensive chart review including EPIC notes, labs (independently reviewed), vital signs, MAR. Coordinated care with attending physician, bedside nursing staff, chaplain, hospice social worker, and TOC.  Also  updated patient's daughter/healthcare power of attorney via phone.   Length of Stay: 1   Physical Exam Constitutional:      General: She is not in acute distress.    Appearance: She is ill-appearing.  Pulmonary:     Effort: Pulmonary effort is normal. No respiratory distress.  Skin:    General: Skin is warm and dry.  Neurological:     Mental Status: She is alert.             Vital Signs: BP (!) 135/57   Pulse 81   Temp 97.7 F (36.5 C) (Oral)   Resp 18   Ht 5' 8 (1.727 m)   Wt 95.7 kg   SpO2 96%   BMI 32.08 kg/m  SpO2: SpO2: 96 % O2 Device: O2 Device: Nasal Cannula O2 Flow Rate: O2 Flow Rate (L/min): 3 L/min      Palliative Assessment/Data: 40%   Palliative Care Assessment & Plan   Patient Profile/Assessment:  63 year old female with extensive medical history admitted on 11/04/2023 for acute on chronic respiratory failure in the setting of end-stage COPD.  Initial palliative visit and extensive goals of care and advance directive discussion completed on 11/04/2023.  Patient and daughter both updated and were considering transitioning to DNR/DNI, also considering treating the treatable versus comfort care.  Both requested time to discuss amongst himself before making any changes.  Followed up on 11/05/2023 and patient and daughter confirm that she is to transition to DNR/DNI.  Plan is to continue aggressive measures for the next 24 hours then to reassess.  If no improvement or if any worsening in her clinical status then patient desires transfer to comfort care only.  Plan if able is to discharge with hospice support.  Disposition location remains undetermined.   Recommendations/Plan: DNR/DNI Treat the treatable and reassess tomorrow to determine next steps Spiritual care consult Continue current symptom regimen at that appears effective Palliative medicine team will continue to follow for ongoing goals of care discussion, symptom management, and coordination of  care.   Symptom management:  Continue Valium  p.o. 5 mg every 6 hours as needed Continue Zofran  4 mg every 6 hours as needed Continue Tylenol  650 mg p.o. every 6 hours as needed Continue oxycodone  5 mg p.o. every 4 hours as needed  Prognosis:  Unable to determine    Discharge Planning: To Be Determined    Detailed review of medical records (labs, imaging, vital signs), medically appropriate exam, discussed with treatment team, counseling and education to patient, family, & staff, documenting clinical information, medication management, coordination of care   Billing based on MDM: High  Problems Addressed: One acute or chronic illness or injury that poses a threat to life or bodily function  Amount and/or Complexity of Data: Category 2:Independent interpretation of a test performed by another physician/other qualified health care professional (not separately reported) and Category 3:Discussion of management or test interpretation with external physician/other qualified health care professional/appropriate source (not separately reported)  Risks: Decision not to resuscitate or to de-escalate care because of poor prognosis         Laymon CHRISTELLA Pinal, NP  Palliative Medicine Team Team phone # [phone]  Thank you for allowing the Palliative Medicine Team to assist in the care of this patient. Please utilize secure chat with additional questions, if there is no response within 30 minutes please call the above phone number.  Palliative Medicine Team providers are available by phone from 7am to 7pm daily and can be reached through the team cell phone.  Should this patient require assistance outside of these hours, please call the patient's attending physician.

## 2023-11-05 NOTE — Progress Notes (Signed)
 Went by to put patient on Bipap, patient was using the restroom and RN in room giving medications.  Asked RN to give me a call when patient was done and ready to go on.

## 2023-11-05 NOTE — Progress Notes (Signed)
 PROGRESS NOTE   [name]  FMW:[id number] DOB: 01/07/1961 DOA: 11/04/2023 PCP: Rollene Almarie LABOR, MD   Chief Complaint  Patient presents with   Respiratory Distress   Level of care: Stepdown  Brief Admission History:  Joan [name] is a 63 year old female on home hospice with end-stage terminal COPD, chronic interstitial lung disease, chronic hypoxic respiratory failure-on O2 demand~5 L, CAD, h/o tobacco abuse, chest pains, HTN, anxiety, bipolar disorder, hypothyroidism, DM2, neuropathy, GERD, HLD, obesity, OSA, on CPAP, polypharmacy, PTSD, multiple hospitalization...SABRASABRA Presenting once again with shortness of breath and hypoxia.  Per EMS was complaining of shortness of breath and distress on 4 L at home was satting in 70s, on BiPAP improved to 94% after breathing treatments.   ED Evaluation: Blood pressure (!) 158/95, pulse (!) 137, temperature 98.2 F (36.8 C), resp. rate (!) 26, height 5' 6 (1.676 m), weight 95.7 kg, SpO2 99% on BiPAP.  VBG: pH 7.32/pCO2 78/pO2 34/bicarb 40.7 WBC 24.5 Respiratory panel RSV, influenza A/B's, COVID All negative  CXR : 1. Worsened bibasilar opacities, which is favored to represent interstitial edema. Superimposed infection not excluded. 2. Possible trace pleural effusions.   Requested patient to be admitted for acute on chronic respiratory failure, possible pneumonia   Assessment and Plan: Acute hypoxic on chronic hypercapnic respiratory failure  - Patient will be admitted for acute on chronic respiratory failure to stepdown unit -Will continue BiPAP as needed only -Maintaining O2 sat 88-92% -Continue with IV steroids, DuoNeb bronchodilators scheduled and as needed -Currently patient is full code but on the hospice Continue discussion with patient, consulting palliative care to determine goals of care and CODE STATUS  Chronic interstitial lung disease - Chronic interstitial lung disease with COPD and chronic respiratory  failure with obstructive sleep apnea on CPAP nightly and 4-5 L continuous O2 at home Currently in acute on chronic respiratory failure, on BiPAP Management as above  Enrolled in home hospice care - Notifying hospice, consulting palliative care -Patient remains full code -Continue discussion regarding CODE STATUS and goals of care  It appears the patient is on home hospice    Bipolar disorder - Polypharmacy currently on Valium , BuSpar , Wellbutrin , Effexor  will--will review home medication and resume accordingly -Will try to avoid polypharmacy  Pneumonia - Presumed pneumonia on chest x-ray, with a COPD exacerbation -Received IV Levaquin , will broaden antibiotics to cefepime -Will obtain procalcitonin level, lactic acid -Cultures blood and sputum  Coronary artery disease - Continue statins beta-blockers, asa  Goals of care, counseling/discussion Full code-continue discussion  PAD (peripheral artery disease)  - Continue statin, aspirin   Controlled type 2 diabetes mellitus with neuropathy - Steroid induced hyperglycemia-induced diabetes - Anticipating hyperglycemia on high-dose steroids, checking CBG q. ACHS with SSI coverage - added semglee  15 units daily and novolog  5 units TID with meals eaten>50% CBG (last 3)  Recent Labs    11/04/23 2111 11/05/23 0737 11/05/23 1123  GLUCAP 212* 182* 260*    Acquired hypothyroidism Continue Synthroid   Depression History of depression/anxiety/bipolar - Polypharmacy review meds-try to modify current meds Which includes: Seroquel , Valium , BuSpar , Effexor ,  PTSD (post-traumatic stress disorder) Polypharmacy continue as needed Valium   Allergic rhinitis As needed to help histamine, continue Singulair   Cervical spondylosis without myelopathy As needed analgesics  Carotid artery disease - Will continue atenolol , statins,  OSA on CPAP Currently on BiPAP  GERD (gastroesophageal reflux disease) - Continue PPI  Mixed  hyperlipidemia Continue statins  Iron deficiency anemia due to dietary causes - Not on iron supplements,  will monitor H&H  Chronic constipation - Will review, and resume bowel regimens  Essential hypertension - Continue atenolol , as needed hydralazine   Anxiety - Continue Valium  at lower dose (at home 5 mg, will continue with 2 mg 3 times daily as needed every 6 hours)   DVT prophylaxis: sq heparin   Code Status: DNR  Family Communication:  Disposition:    Consultants:   Procedures:   Antimicrobials:    Subjective: Pt remains short of breath.   Objective: Vitals:   11/05/23 0900 11/05/23 1000 11/05/23 1100 11/05/23 1209  BP:  (!) 117/37 (!) 114/33 121/63  Pulse: 87 79 72 84  Resp:  19 18 18   Temp:    97.7 F (36.5 C)  TempSrc:    Oral  SpO2: 94% 96% 96% 96%  Weight:      Height:        Intake/Output Summary (Last 24 hours) at 11/05/2023 1313 Last data filed at 11/05/2023 1155 Gross per 24 hour  Intake 677.66 ml  Output 1400 ml  Net -722.34 ml   Filed Weights   11/04/23 1136 11/04/23 1416 11/05/23 0400  Weight: 95.7 kg 96.5 kg 95.7 kg   Examination:  General exam: Appears calm and comfortable  Respiratory system: insp/exp wheezing and rales heard bilateral. Cardiovascular system: normal S1 & S2 heard. No JVD, murmurs, rubs, gallops or clicks. No pedal edema. Gastrointestinal system: Abdomen is nondistended, soft and nontender. No organomegaly or masses felt. Normal bowel sounds heard. Central nervous system: Alert and oriented. No focal neurological deficits. Extremities: Symmetric 5 x 5 power. Skin: No rashes, lesions or ulcers. Psychiatry: Judgement and insight appear normal. Mood & affect appropriate.   Data Reviewed: I have personally reviewed following labs and imaging studies  CBC: Recent Labs  Lab 11/04/23 1150 11/04/23 1342 11/05/23 0338  WBC 24.5* 21.6* 27.8*  NEUTROABS 22.1*  --   --   HGB 14.4 14.2 10.5*  HCT 48.3* 46.9* 34.7*  MCV  93.8 92.7 91.3  PLT 315 260 229    Basic Metabolic Panel: Recent Labs  Lab 11/04/23 1150 11/04/23 1342 11/05/23 0338  NA 145  --  138  K 3.7  --  3.9  CL 99  --  99  CO2 31  --  29  GLUCOSE 146*  --  239*  BUN 15  --  18  CREATININE 0.93  --  0.93  CALCIUM  9.4  --  8.1*  MG  --  1.6*  --   PHOS  --  2.9  --     CBG: Recent Labs  Lab 11/04/23 1137 11/04/23 1629 11/04/23 2111 11/05/23 0737 11/05/23 1123  GLUCAP 138* 245* 212* 182* 260*    Recent Results (from the past 240 hours)  Resp panel by RT-PCR (RSV, Flu A&B, Covid) Anterior Nasal Swab     Status: None   Collection Time: 11/04/23 11:45 AM   Specimen: Anterior Nasal Swab  Result Value Ref Range Status   SARS Coronavirus 2 by RT PCR NEGATIVE NEGATIVE Final    Comment: (NOTE) SARS-CoV-2 target nucleic acids are NOT DETECTED.  The SARS-CoV-2 RNA is generally detectable in upper respiratory specimens during the acute phase of infection. The lowest concentration of SARS-CoV-2 viral copies this assay can detect is 138 copies/mL. A negative result does not preclude SARS-Cov-2 infection and should not be used as the sole basis for treatment or other patient management decisions. A negative result may occur with  improper specimen collection/handling, submission of specimen other  than nasopharyngeal swab, presence of viral mutation(s) within the areas targeted by this assay, and inadequate number of viral copies(<138 copies/mL). A negative result must be combined with clinical observations, patient history, and epidemiological information. The expected result is Negative.  Fact Sheet for Patients:  BloggerCourse.com  Fact Sheet for Healthcare Providers:  SeriousBroker.it  This test is no t yet approved or cleared by the [place]  FDA and  has been authorized for detection and/or diagnosis of SARS-CoV-2 by FDA under an Emergency Use Authorization (EUA). This  EUA will remain  in effect (meaning this test can be used) for the duration of the COVID-19 declaration under Section 564(b)(1) of the Act, 21 U.S.C.section 360bbb-3(b)(1), unless the authorization is terminated  or revoked sooner.       Influenza A by PCR NEGATIVE NEGATIVE Final   Influenza B by PCR NEGATIVE NEGATIVE Final    Comment: (NOTE) The Xpert Xpress SARS-CoV-2/FLU/RSV plus assay is intended as an aid in the diagnosis of influenza from Nasopharyngeal swab specimens and should not be used as a sole basis for treatment. Nasal washings and aspirates are unacceptable for Xpert Xpress SARS-CoV-2/FLU/RSV testing.  Fact Sheet for Patients: BloggerCourse.com  Fact Sheet for Healthcare Providers: SeriousBroker.it  This test is not yet approved or cleared by the [place]  FDA and has been authorized for detection and/or diagnosis of SARS-CoV-2 by FDA under an Emergency Use Authorization (EUA). This EUA will remain in effect (meaning this test can be used) for the duration of the COVID-19 declaration under Section 564(b)(1) of the Act, 21 U.S.C. section 360bbb-3(b)(1), unless the authorization is terminated or revoked.     Resp Syncytial Virus by PCR NEGATIVE NEGATIVE Final    Comment: (NOTE) Fact Sheet for Patients: BloggerCourse.com  Fact Sheet for Healthcare Providers: SeriousBroker.it  This test is not yet approved or cleared by the [place]  FDA and has been authorized for detection and/or diagnosis of SARS-CoV-2 by FDA under an Emergency Use Authorization (EUA). This EUA will remain in effect (meaning this test can be used) for the duration of the COVID-19 declaration under Section 564(b)(1) of the Act, 21 U.S.C. section 360bbb-3(b)(1), unless the authorization is terminated or revoked.  Performed at [facility], [address], [place], [place] [address]    Expectorated Sputum Assessment w Gram Stain, Rflx to Resp Cult     Status: None   Collection Time: 11/04/23  1:18 PM   Specimen: Sputum  Result Value Ref Range Status   Specimen Description SPUTUM  Final   Special Requests NONE  Final   Sputum evaluation   Final    THIS SPECIMEN IS ACCEPTABLE FOR SPUTUM CULTURE Performed at [facility], [address], [place], [place] [address]    Report Status 11/04/2023 FINAL  Final  Culture, Respiratory w Gram Stain     Status: None (Preliminary result)   Collection Time: 11/04/23  1:18 PM   Specimen: SPU  Result Value Ref Range Status   Specimen Description   Final    SPUTUM Performed at [facility], [address] [place], [place] [address]    Special Requests   Final    NONE Reflexed from [phone] Performed at [facility], [address] [place], [place] [address]    Gram Stain   Final    RARE WBC PRESENT, PREDOMINANTLY PMN FEW GRAM POSITIVE COCCI RARE BUDDING YEAST SEEN Performed at [facility], [address], [place], [place] [address]    Culture PENDING  Incomplete  Report Status PENDING  Incomplete  Blood Culture (routine x 2)     Status: None (Preliminary result)   Collection Time: 11/04/23  1:41 PM   Specimen: BLOOD  Result Value Ref Range Status   Specimen Description BLOOD BLOOD RIGHT HAND  Final   Special Requests   Final    BOTTLES DRAWN AEROBIC AND ANAEROBIC Blood Culture results may not be optimal due to an inadequate volume of blood received in culture bottles   Culture   Final    NO GROWTH < 24 HOURS Performed at [facility], [address] [place], [place] [address]    Report Status PENDING  Incomplete  Blood Culture (routine x 2)     Status: None (Preliminary result)   Collection Time: 11/04/23  1:42 PM   Specimen: BLOOD  Result Value Ref Range Status   Specimen Description BLOOD LEFT HAND  Final   Special Requests   Final    BOTTLES DRAWN AEROBIC ONLY Blood Culture results may not be optimal due to  an inadequate volume of blood received in culture bottles   Culture   Final    NO GROWTH < 24 HOURS Performed at [facility], [address] [place], [place] [address]    Report Status PENDING  Incomplete  MRSA Next Gen by PCR, Nasal     Status: Abnormal   Collection Time: 11/04/23  2:34 PM   Specimen: Nasal Mucosa; Nasal Swab  Result Value Ref Range Status   MRSA by PCR Next Gen DETECTED (A) NOT DETECTED Final    Comment: RESULT CALLED TO, READ BACK BY AND VERIFIED WITH: JAMIE BAILEY @ 1823 ON 11/04/23 C VARNER (NOTE) The GeneXpert MRSA Assay (FDA approved for NASAL specimens only), is one component of a comprehensive MRSA colonization surveillance program. It is not intended to diagnose MRSA infection nor to guide or monitor treatment for MRSA infections. Test performance is not FDA approved in patients less than 67 years old. Performed at [facility], [address] [place], [place] [address]      Radiology Studies: [facility] Chest [facility] 1 View Result Date: 11/04/2023 EXAM: 1 VIEW XRAY OF THE CHEST 11/04/2023 12:13:00 PM COMPARISON: 10/23/2023 CLINICAL HISTORY: Cough, shortness of breath. Per triage: Patient brought in by EMS from home complaining of respiratory distress, on 4L O2 at home, sats 70% upon EMS arrival, up to 94% on CPAP; patient given 3 albuterol  and 2 duonebs en route; decreased lower lung sounds, rhonchi and wheezing noted by EMS, ST on monitor, BP 180/70 en route; history of COPD, EDP and RT at bedside upon arrival. FINDINGS: LUNGS AND PLEURA: Worsened bibasilar interstitial opacities. Possible trace pleural effusions. No pneumothorax. HEART AND MEDIASTINUM: No acute abnormality of the cardiac and mediastinal silhouettes. BONES AND SOFT TISSUES: No acute osseous abnormality. IMPRESSION: 1. Worsened bibasilar opacities, which is favored to represent interstitial edema. Superimposed infection not excluded. 2. Possible trace pleural effusions. Electronically signed by: Waddell Calk  MD 11/04/2023 12:56 PM EDT RP Workstation: [id number]    Scheduled Meds:  acyclovir   400 mg Oral BID   atenolol   25 mg Oral Daily   atorvastatin   20 mg Oral Daily   buPROPion   300 mg Oral Daily   busPIRone   5 mg Oral QHS   Chlorhexidine  Gluconate Cloth  6 each Topical Daily   furosemide   20 mg Oral Daily   guaiFENesin -dextromethorphan   10 mL Oral Q8H   heparin   5,000 Units Subcutaneous Q8H   insulin  aspart  0-20 Units  Subcutaneous TID WC   insulin  aspart  5 Units Subcutaneous TID WC   insulin  glargine  15 Units Subcutaneous Daily   ipratropium  0.5 mg Nebulization Q6H   levalbuterol   0.63 mg Nebulization Q6H   levothyroxine   25 mcg Oral QAC breakfast   linezolid   600 mg Oral Q12H   melatonin  5 mg Oral QHS   methylPREDNISolone  sodium succinate  40 mg Intravenous Q8H   montelukast   10 mg Oral QHS   mupirocin  ointment   Nasal BID   pantoprazole   40 mg Oral Daily   polyethylene glycol  17 g Oral Daily   pregabalin   300 mg Oral BID   QUEtiapine   150 mg Oral QHS   roflumilast   500 mcg Oral Daily   senna-docusate  1 tablet Oral BID   sodium chloride  flush  3 mL Intravenous Q12H   sodium chloride  flush  3 mL Intravenous Q12H   tamsulosin   0.4 mg Oral Daily   [START ON 11/06/2023] venlafaxine  XR  150 mg Oral Q breakfast   Continuous Infusions:  levofloxacin  (LEVAQUIN ) IV 750 mg (11/05/23 0958)     LOS: 1 day   Time spent: 59 mins  Rockney Grenz Vicci, MD How to contact the [facility] Attending or Consulting provider 7A - 7P or covering provider during after hours 7P -7A, for this patient?  Check the care team in [facility] and look for a) attending/consulting [facility] provider listed and b) the [facility] team listed Log into [email or web address] to find provider on call.  Locate the [facility] provider you are looking for under [facility] and page to a number that you can be directly reached. If you still have difficulty reaching the provider, please page the [facility] (Director on Call) for the Hospitalists listed  on amion for assistance.  11/05/2023, 1:13 PM

## 2023-11-05 NOTE — Plan of Care (Signed)

## 2023-11-06 ENCOUNTER — Encounter: Payer: Self-pay | Admitting: Cardiovascular Disease

## 2023-11-06 DIAGNOSIS — Z7189 Other specified counseling: Secondary | ICD-10-CM | POA: Diagnosis not present

## 2023-11-06 DIAGNOSIS — J449 Chronic obstructive pulmonary disease, unspecified: Secondary | ICD-10-CM | POA: Diagnosis not present

## 2023-11-06 DIAGNOSIS — Z515 Encounter for palliative care: Secondary | ICD-10-CM | POA: Diagnosis not present

## 2023-11-06 DIAGNOSIS — Z558 Other problems related to education and literacy: Secondary | ICD-10-CM

## 2023-11-06 DIAGNOSIS — Z789 Other specified health status: Secondary | ICD-10-CM | POA: Diagnosis not present

## 2023-11-06 DIAGNOSIS — J9601 Acute respiratory failure with hypoxia: Secondary | ICD-10-CM | POA: Diagnosis not present

## 2023-11-06 DIAGNOSIS — Z66 Do not resuscitate: Secondary | ICD-10-CM

## 2023-11-06 DIAGNOSIS — J9612 Chronic respiratory failure with hypercapnia: Secondary | ICD-10-CM

## 2023-11-06 LAB — GLUCOSE, CAPILLARY
Glucose-Capillary: 193 mg/dL — ABNORMAL HIGH (ref 70–99)
Glucose-Capillary: 232 mg/dL — ABNORMAL HIGH (ref 70–99)
Glucose-Capillary: 265 mg/dL — ABNORMAL HIGH (ref 70–99)
Glucose-Capillary: 277 mg/dL — ABNORMAL HIGH (ref 70–99)
Glucose-Capillary: 215 mg/dL — ABNORMAL HIGH (ref 70–99)
Glucose-Capillary: 265 mg/dL — ABNORMAL HIGH (ref 70–99)

## 2023-11-06 LAB — BASIC METABOLIC PANEL WITH GFR
Anion gap: 10 (ref 5–15)
BUN: 24 mg/dL — ABNORMAL HIGH (ref 8–23)
CO2: 29 mmol/L (ref 22–32)
Calcium: 8.4 mg/dL — ABNORMAL LOW (ref 8.9–10.3)
Chloride: 100 mmol/L (ref 98–111)
Creatinine, Ser: 0.89 mg/dL (ref 0.44–1.00)
GFR, Estimated: >60 mL/min (ref >60)
Glucose, Bld: 236 mg/dL — ABNORMAL HIGH (ref 70–99)
Potassium: 3.9 mmol/L (ref 3.5–5.1)
Sodium: 139 mmol/L (ref 135–145)

## 2023-11-06 LAB — CBC
HCT: 32.2 % — ABNORMAL LOW (ref 36.0–46.0)
Hemoglobin: 9.8 g/dL — ABNORMAL LOW (ref 12.0–15.0)
MCH: 27.5 pg (ref 26.0–34.0)
MCHC: 30.4 g/dL (ref 30.0–36.0)
MCV: 90.4 fL (ref 80.0–100.0)
Platelets: 201 K/uL (ref 150–400)
RBC: 3.56 MIL/uL — ABNORMAL LOW (ref 3.87–5.11)
RDW: 15.4 % (ref 11.5–15.5)
WBC: 21.7 K/uL — ABNORMAL HIGH (ref 4.0–10.5)
nRBC: 0 % (ref 0.0–0.2)

## 2023-11-06 MED ORDER — INSULIN ASPART 100 UNIT/ML IJ SOLN
0.0000 [IU] | Freq: Three times a day (TID) | INTRAMUSCULAR | Status: DC
  Administered 2023-11-06: 11 [IU] via SUBCUTANEOUS
  Administered 2023-11-06: 7 [IU] via SUBCUTANEOUS
  Administered 2023-11-06: 11 [IU] via SUBCUTANEOUS
  Administered 2023-11-07: 4 [IU] via SUBCUTANEOUS
  Administered 2023-11-07 (×2): 20 [IU] via SUBCUTANEOUS
  Administered 2023-11-08: 4 [IU] via SUBCUTANEOUS
  Administered 2023-11-08: 7 [IU] via SUBCUTANEOUS
  Administered 2023-11-08 – 2023-11-09 (×3): 4 [IU] via SUBCUTANEOUS
  Administered 2023-11-09: 7 [IU] via SUBCUTANEOUS
  Administered 2023-11-10: 4 [IU] via SUBCUTANEOUS

## 2023-11-06 MED ORDER — INSULIN ASPART 100 UNIT/ML IJ SOLN
8.0000 [IU] | Freq: Three times a day (TID) | INTRAMUSCULAR | Status: DC
  Administered 2023-11-06 – 2023-11-07 (×5): 8 [IU] via SUBCUTANEOUS

## 2023-11-06 MED ORDER — INSULIN ASPART 100 UNIT/ML IJ SOLN
0.0000 [IU] | Freq: Every day | INTRAMUSCULAR | Status: DC
  Administered 2023-11-06: 2 [IU] via SUBCUTANEOUS
  Administered 2023-11-06: 3 [IU] via SUBCUTANEOUS
  Administered 2023-11-08 – 2023-11-09 (×2): 2 [IU] via SUBCUTANEOUS

## 2023-11-06 MED ORDER — INSULIN GLARGINE 100 UNIT/ML ~~LOC~~ SOLN
18.0000 [IU] | Freq: Every day | SUBCUTANEOUS | Status: DC
  Administered 2023-11-06 – 2023-11-07 (×2): 18 [IU] via SUBCUTANEOUS
  Filled 2023-11-06 (×4): qty 0.18

## 2023-11-06 MED ORDER — INSULIN ASPART 100 UNIT/ML IJ SOLN
6.0000 [IU] | Freq: Three times a day (TID) | INTRAMUSCULAR | Status: DC
  Administered 2023-11-06: 6 [IU] via SUBCUTANEOUS

## 2023-11-06 NOTE — Plan of Care (Signed)
  Problem: Education: Goal: Knowledge of General Education information will improve Description: Including pain rating scale, medication(s)/side effects and non-pharmacologic comfort measures Outcome: Progressing   Problem: Health Behavior/Discharge Planning: Goal: Ability to manage health-related needs will improve Outcome: Progressing   Problem: Clinical Measurements: Goal: Ability to maintain clinical measurements within normal limits will improve Outcome: Progressing Goal: Will remain free from infection Outcome: Progressing Goal: Diagnostic test results will improve Outcome: Progressing Goal: Respiratory complications will improve Outcome: Progressing Goal: Cardiovascular complication will be avoided Outcome: Progressing   Problem: Activity: Goal: Risk for activity intolerance will decrease Outcome: Progressing   Problem: Pain Managment: Goal: General experience of comfort will improve and/or be controlled Outcome: Progressing

## 2023-11-06 NOTE — Plan of Care (Signed)

## 2023-11-06 NOTE — Progress Notes (Signed)
 PROGRESS NOTE   Joan [name]  FMW:[id number] DOB: 1960-06-01 DOA: 11/04/2023 PCP: Rollene Almarie LABOR, MD   Chief Complaint  Patient presents with   Respiratory Distress   Level of care: Telemetry  Brief Admission History:  Joan [name] is a 63 year old female on home hospice with end-stage terminal COPD, chronic interstitial lung disease, chronic hypoxic respiratory failure-on O2 demand~5 L, CAD, h/o tobacco abuse, chest pains, HTN, anxiety, bipolar disorder, hypothyroidism, DM2, neuropathy, GERD, HLD, obesity, OSA, on CPAP, polypharmacy, PTSD, multiple hospitalization...SABRASABRA Presenting once again with shortness of breath and hypoxia.  Per EMS was complaining of shortness of breath and distress on 4 L at home was satting in 70s, on BiPAP improved to 94% after breathing treatments.   ED Evaluation: Blood pressure (!) 158/95, pulse (!) 137, temperature 98.2 F (36.8 C), resp. rate (!) 26, height 5' 6 (1.676 m), weight 95.7 kg, SpO2 99% on BiPAP.  VBG: pH 7.32/pCO2 78/pO2 34/bicarb 40.7 WBC 24.5 Respiratory panel RSV, influenza A/B's, COVID All negative  CXR : 1. Worsened bibasilar opacities, which is favored to represent interstitial edema. Superimposed infection not excluded. 2. Possible trace pleural effusions.   Requested patient to be admitted for acute on chronic respiratory failure, possible pneumonia   Assessment and Plan:  Acute hypoxic on chronic hypercapnic respiratory failure  -Patient will be admitted for acute on chronic respiratory failure to stepdown unit -Will continue BiPAP QHS -Maintaining O2 sat 88-92% -Continue with IV steroids, DuoNeb bronchodilators scheduled and as needed -Currently patient is DNRDNI on home hospice Continue discussion with patient, consulting palliative care to determine goals of care and CODE STATUS  Chronic interstitial lung disease - Chronic interstitial lung disease with COPD and chronic respiratory failure with  obstructive sleep apnea on CPAP nightly and 4-5 L continuous O2 at home Currently in acute on chronic respiratory failure, on BiPAP Management as above  Enrolled in home hospice care - appreciate palliative care team consultation - Continue discussion regarding CODE STATUS and goals of care  Bipolar disorder - Polypharmacy currently on Valium , BuSpar , Wellbutrin , Effexor  will--will review home medication and resume accordingly -Will try to avoid polypharmacy  Pneumonia -Presumed pneumonia on chest x-ray, with a COPD exacerbation -continue IV Levaquin , linezolid  for 5 days -Will obtain procalcitonin level, lactic acid -Cultures blood and sputum  Coronary artery disease - Continue statins beta-blockers, asa  Goals of care, counseling/discussion Full code-continue discussion  PAD (peripheral artery disease)  - Continue statin, aspirin   Controlled type 2 diabetes mellitus with neuropathy - Steroid induced hyperglycemia-induced diabetes - Anticipating hyperglycemia on high-dose steroids, checking CBG q. ACHS with SSI coverage - added semglee  18 units daily and novolog  8 units TID with meals eaten>50% CBG (last 3)  Recent Labs    11/05/23 2334 11/06/23 0222 11/06/23 0727  GLUCAP 279* 232* 265*   Acquired hypothyroidism Continue Synthroid   Depression History of depression/anxiety/bipolar - Polypharmacy review meds-try to modify current meds Which includes: Seroquel , Valium , BuSpar , Effexor ,  PTSD (post-traumatic stress disorder) Polypharmacy continue as needed Valium   Allergic rhinitis As needed to help histamine, continue Singulair   Cervical spondylosis without myelopathy As needed analgesics  Carotid artery disease - Will continue atenolol , statins,  OSA on CPAP Currently on BiPAP  GERD (gastroesophageal reflux disease) - Continue PPI  Mixed hyperlipidemia Continue statins  Iron deficiency anemia due to dietary causes - Not on iron supplements, will  monitor H&H  Chronic constipation - Will review, and resume bowel regimens  Essential hypertension - Continue atenolol , as needed  hydralazine   Anxiety - Continue Valium  at lower dose (at home 5 mg, will continue with 2 mg 3 times daily as needed every 6 hours)   DVT prophylaxis: sq heparin   Code Status: DNR  Family Communication:  Disposition: TBD    Consultants:   Procedures:   Antimicrobials:    Subjective: Pt says she continues to be weak and SOB, she is asking how much longer do I  have?   Objective: Vitals:   11/06/23 0800 11/06/23 0825 11/06/23 0846 11/06/23 0850  BP: (!) 128/46  (!) 131/46 (!) 131/46  Pulse: 83  88 84  Resp: 19   15  Temp:      TempSrc:      SpO2: 95% 94%  91%  Weight:      Height:        Intake/Output Summary (Last 24 hours) at 11/06/2023 1027 Last data filed at 11/06/2023 0923 Gross per 24 hour  Intake 748.93 ml  Output 2575 ml  Net -1826.07 ml   Filed Weights   11/04/23 1136 11/04/23 1416 11/05/23 0400  Weight: 95.7 kg 96.5 kg 95.7 kg   Examination:  General exam: Appears chronically ill, calm and NAD.  Respiratory system: diffuse insp/exp wheezing and rales heard bilateral. Cardiovascular system: normal S1 & S2 heard. No JVD, murmurs, rubs, gallops or clicks. No pedal edema. Gastrointestinal system: Abdomen is nondistended, soft and nontender. No organomegaly or masses felt. Normal bowel sounds heard. Central nervous system: Alert and oriented. No focal neurological deficits. Extremities: Symmetric 5 x 5 power. Skin: No rashes, lesions or ulcers. Psychiatry: Judgement and insight appear normal. Mood & affect appropriate.   Data Reviewed: I have personally reviewed following labs and imaging studies  CBC: Recent Labs  Lab 11/04/23 1150 11/04/23 1342 11/05/23 0338 11/06/23 0405  WBC 24.5* 21.6* 27.8* 21.7*  NEUTROABS 22.1*  --   --   --   HGB 14.4 14.2 10.5* 9.8*  HCT 48.3* 46.9* 34.7* 32.2*  MCV 93.8 92.7 91.3 90.4   PLT 315 260 229 201    Basic Metabolic Panel: Recent Labs  Lab 11/04/23 1150 11/04/23 1342 11/05/23 0338 11/06/23 0405  NA 145  --  138 139  K 3.7  --  3.9 3.9  CL 99  --  99 100  CO2 31  --  29 29  GLUCOSE 146*  --  239* 236*  BUN 15  --  18 24*  CREATININE 0.93  --  0.93 0.89  CALCIUM  9.4  --  8.1* 8.4*  MG  --  1.6*  --   --   PHOS  --  2.9  --   --     CBG: Recent Labs  Lab 11/05/23 1610 11/05/23 1922 11/05/23 2334 11/06/23 0222 11/06/23 0727  GLUCAP 127* 176* 279* 232* 265*    Recent Results (from the past 240 hours)  Resp panel by RT-PCR (RSV, Flu A&B, Covid) Anterior Nasal Swab     Status: None   Collection Time: 11/04/23 11:45 AM   Specimen: Anterior Nasal Swab  Result Value Ref Range Status   SARS Coronavirus 2 by RT PCR NEGATIVE NEGATIVE Final    Comment: (NOTE) SARS-CoV-2 target nucleic acids are NOT DETECTED.  The SARS-CoV-2 RNA is generally detectable in upper respiratory specimens during the acute phase of infection. The lowest concentration of SARS-CoV-2 viral copies this assay can detect is 138 copies/mL. A negative result does not preclude SARS-Cov-2 infection and should not be used as the sole basis  for treatment or other patient management decisions. A negative result may occur with  improper specimen collection/handling, submission of specimen other than nasopharyngeal swab, presence of viral mutation(s) within the areas targeted by this assay, and inadequate number of viral copies(<138 copies/mL). A negative result must be combined with clinical observations, patient history, and epidemiological information. The expected result is Negative.  Fact Sheet for Patients:  BloggerCourse.com  Fact Sheet for Healthcare Providers:  SeriousBroker.it  This test is no t yet approved or cleared by the [place]  FDA and  has been authorized for detection and/or diagnosis of SARS-CoV-2 by FDA  under an Emergency Use Authorization (EUA). This EUA will remain  in effect (meaning this test can be used) for the duration of the COVID-19 declaration under Section 564(b)(1) of the Act, 21 U.S.C.section 360bbb-3(b)(1), unless the authorization is terminated  or revoked sooner.       Influenza A by PCR NEGATIVE NEGATIVE Final   Influenza B by PCR NEGATIVE NEGATIVE Final    Comment: (NOTE) The Xpert Xpress SARS-CoV-2/FLU/RSV plus assay is intended as an aid in the diagnosis of influenza from Nasopharyngeal swab specimens and should not be used as a sole basis for treatment. Nasal washings and aspirates are unacceptable for Xpert Xpress SARS-CoV-2/FLU/RSV testing.  Fact Sheet for Patients: BloggerCourse.com  Fact Sheet for Healthcare Providers: SeriousBroker.it  This test is not yet approved or cleared by the [place]  FDA and has been authorized for detection and/or diagnosis of SARS-CoV-2 by FDA under an Emergency Use Authorization (EUA). This EUA will remain in effect (meaning this test can be used) for the duration of the COVID-19 declaration under Section 564(b)(1) of the Act, 21 U.S.C. section 360bbb-3(b)(1), unless the authorization is terminated or revoked.     Resp Syncytial Virus by PCR NEGATIVE NEGATIVE Final    Comment: (NOTE) Fact Sheet for Patients: BloggerCourse.com  Fact Sheet for Healthcare Providers: SeriousBroker.it  This test is not yet approved or cleared by the [place]  FDA and has been authorized for detection and/or diagnosis of SARS-CoV-2 by FDA under an Emergency Use Authorization (EUA). This EUA will remain in effect (meaning this test can be used) for the duration of the COVID-19 declaration under Section 564(b)(1) of the Act, 21 U.S.C. section 360bbb-3(b)(1), unless the authorization is terminated or revoked.  Performed at [facility], [address], [place], [place] [address]   Expectorated Sputum Assessment w Gram Stain, Rflx to Resp Cult     Status: None   Collection Time: 11/04/23  1:18 PM   Specimen: Sputum  Result Value Ref Range Status   Specimen Description SPUTUM  Final   Special Requests NONE  Final   Sputum evaluation   Final    THIS SPECIMEN IS ACCEPTABLE FOR SPUTUM CULTURE Performed at [facility], [address] [place], [place] [address]    Report Status 11/04/2023 FINAL  Final  Culture, Respiratory w Gram Stain     Status: None (Preliminary result)   Collection Time: 11/04/23  1:18 PM   Specimen: SPU  Result Value Ref Range Status   Specimen Description   Final    SPUTUM Performed at [facility], [address], [place], [place] [address]    Special Requests   Final    NONE Reflexed from [id number] Performed at [facility], [address], [place], [place] [address]    Gram Stain   Final    RARE WBC PRESENT, PREDOMINANTLY PMN FEW GRAM POSITIVE COCCI RARE BUDDING YEAST SEEN  Culture   Final    TOO YOUNG TO READ Performed at [facility], [address] [place], [place] [address]    Report Status PENDING  Incomplete  Blood Culture (routine x 2)     Status: None (Preliminary result)   Collection Time: 11/04/23  1:41 PM   Specimen: BLOOD  Result Value Ref Range Status   Specimen Description BLOOD BLOOD RIGHT HAND  Final   Special Requests   Final    BOTTLES DRAWN AEROBIC AND ANAEROBIC Blood Culture results may not be optimal due to an inadequate volume of blood received in culture bottles   Culture   Final    NO GROWTH 2 DAYS Performed at [facility], [address], [place], [place] [address]    Report Status PENDING  Incomplete  Blood Culture (routine x 2)     Status: None (Preliminary result)   Collection Time: 11/04/23  1:42 PM   Specimen: BLOOD  Result Value Ref Range Status   Specimen Description BLOOD LEFT HAND  Final   Special Requests   Final    BOTTLES DRAWN  AEROBIC ONLY Blood Culture results may not be optimal due to an inadequate volume of blood received in culture bottles   Culture   Final    NO GROWTH 2 DAYS Performed at [facility], [address], [place], [place] [address]    Report Status PENDING  Incomplete  MRSA Next Gen by PCR, Nasal     Status: Abnormal   Collection Time: 11/04/23  2:34 PM   Specimen: Nasal Mucosa; Nasal Swab  Result Value Ref Range Status   MRSA by PCR Next Gen DETECTED (A) NOT DETECTED Final    Comment: RESULT CALLED TO, READ BACK BY AND VERIFIED WITH: JAMIE BAILEY @ 1823 ON 11/04/23 C VARNER (NOTE) The GeneXpert MRSA Assay (FDA approved for NASAL specimens only), is one component of a comprehensive MRSA colonization surveillance program. It is not intended to diagnose MRSA infection nor to guide or monitor treatment for MRSA infections. Test performance is not FDA approved in patients less than 29 years old. Performed at [facility], [address], [place], [place] [address]      Radiology Studies: [facility] Chest [facility] 1 View Result Date: 11/04/2023 EXAM: 1 VIEW XRAY OF THE CHEST 11/04/2023 12:13:00 PM COMPARISON: 10/23/2023 CLINICAL HISTORY: Cough, shortness of breath. Per triage: Patient brought in by EMS from home complaining of respiratory distress, on 4L O2 at home, sats 70% upon EMS arrival, up to 94% on CPAP; patient given 3 albuterol  and 2 duonebs en route; decreased lower lung sounds, rhonchi and wheezing noted by EMS, ST on monitor, BP 180/70 en route; history of COPD, EDP and RT at bedside upon arrival. FINDINGS: LUNGS AND PLEURA: Worsened bibasilar interstitial opacities. Possible trace pleural effusions. No pneumothorax. HEART AND MEDIASTINUM: No acute abnormality of the cardiac and mediastinal silhouettes. BONES AND SOFT TISSUES: No acute osseous abnormality. IMPRESSION: 1. Worsened bibasilar opacities, which is favored to represent interstitial edema. Superimposed infection not excluded. 2. Possible trace  pleural effusions. Electronically signed by: Waddell Calk MD 11/04/2023 12:56 PM EDT RP Workstation: [id number]    Scheduled Meds:  acyclovir   400 mg Oral BID   atenolol   25 mg Oral Daily   atorvastatin   20 mg Oral Daily   buPROPion   300 mg Oral Daily   busPIRone   5 mg Oral QHS   Chlorhexidine  Gluconate Cloth  6 each Topical Daily   furosemide   20 mg  Oral Daily   guaiFENesin -dextromethorphan   10 mL Oral Q8H   heparin   5,000 Units Subcutaneous Q8H   insulin  aspart  0-20 Units Subcutaneous TID WC   insulin  aspart  0-5 Units Subcutaneous QHS   insulin  aspart  6 Units Subcutaneous TID WC   insulin  glargine  18 Units Subcutaneous Daily   ipratropium  0.5 mg Nebulization Q6H   levalbuterol   0.63 mg Nebulization Q6H   levothyroxine   25 mcg Oral QAC breakfast   linezolid   600 mg Oral Q12H   melatonin  6 mg Oral QHS   methylPREDNISolone  sodium succinate  40 mg Intravenous Q8H   montelukast   10 mg Oral QHS   mupirocin  ointment   Nasal BID   pantoprazole   40 mg Oral Daily   polyethylene glycol  17 g Oral Daily   pregabalin   300 mg Oral BID   QUEtiapine   150 mg Oral QHS   roflumilast   500 mcg Oral Daily   senna-docusate  1 tablet Oral BID   sodium chloride  flush  3 mL Intravenous Q12H   sodium chloride  flush  3 mL Intravenous Q12H   tamsulosin   0.4 mg Oral Daily   venlafaxine  XR  150 mg Oral Q breakfast   Continuous Infusions:  levofloxacin  (LEVAQUIN ) IV 750 mg (11/06/23 0857)     LOS: 2 days   Time spent: 57 mins  Zarriah Starkel Vicci, MD How to contact the [facility] Attending or Consulting provider 7A - 7P or covering provider during after hours 7P -7A, for this patient?  Check the care team in [facility] and look for a) attending/consulting [facility] provider listed and b) the [facility] team listed Log into [email or web address] to find provider on call.  Locate the [facility] provider you are looking for under [facility] and page to a number that you can be directly reached. If you still have difficulty  reaching the provider, please page the [facility] (Director on Call) for the Hospitalists listed on amion for assistance.  11/06/2023, 10:27 AM

## 2023-11-06 NOTE — Progress Notes (Signed)
 Daily Progress Note   Patient Name: Joan [name]       Date: 11/06/2023 DOB: 12-15-1960  Age: 63 y.o. MRN#: [id number] Attending Physician: Vicci Afton CROME, MD Primary Care Physician: Rollene Almarie LABOR, MD Admit Date: 11/04/2023  Reason for Consultation/Follow-up: Establishing goals of care  Subjective:   63 y.o. female  with numerous chronic medical problems including but not limited end-stage terminal COPD, chronic interstitial lung disease, chronic hypoxic respiratory failure-on O2 demand~5 L, CAD, h/o tobacco abuse, chest pains, HTN, anxiety, bipolar disorder, hypothyroidism, DM2, neuropathy, GERD, HLD, obesity, OSA on CPAP, polypharmacy, PTSD, multiple hospitalizations  admitted on 11/04/2023 with shortness of breath and hypoxia.    Patient admitted for acute on chronic respiratory failure, COPD exacerbation, and possible pneumonia.  She required BiPAP therapy in the emergency department and was admitted to ICU.  Started on IV steroids, bronchodilators, and IV antibiotics.  It is noted that she is a home hospice patient but was listed as full code.   11/04/2023: Initial completed, extensive goals of care and advance care planning discussion held with patient and her daughter.  Patient expressed desire to transition to DNR/DNI, treat the treatable for 24 to 48 hours, and transition to comfort for any declines or if not improving.  However, she expressed she wanted me to follow-up with her daughter for making any decisions.  Followed up with daughter she wanted to talk with her mother and requested she remain full code/full scope until she was able to confirm this decision.  11/05/2023-followed up with patient in person and daughter via phone.  Close that is confirmed as DNR/DNI.  Plan is to continue aggressive measures with IV  antibiotics and steroids and allow time for outcomes.  Today, labs independently reviewed.  Chemistry panel reveals hyperglycemia consistent with her type 2 diabetes with a glucose level of 236.  BUN elevated at 24 but creatinine and GFR stable.  Calcium  slightly low but improved from yesterday with a reading of 8.4 compared to 8.1 yesterday.  Would recommend monitoring in the setting of diuretic use.  CBC reviewed.  White blood cell count continues to be elevated but improved from prior levels 21.7 today<<27.8 on 11/05/2023.  She continues on IV antibiotics and steroids.  Hemoglobin continues to trend down with a level of 9.8 g/dL today compared to 89.4 g/dL yesterday.  Would recommend continued monitoring.   Vital signs reviewed and appear relatively stable continues on BiPAP at night and O2 via nasal cannula during the day.  Medication administration record reviewed.  I see where patient has required a total of 10 mg of oral  Valium  and a total of 15 mg of oral oxycodone  on 24-hour look back.  Patient states she is doing okay.  She does feel somewhat better.  She reports a pain of 5 out of 10 which she describes as a tightness around her upper abdomen.  She states that the pain medicine is effective.  She continues to experience significant shortness of breath.  She has mild shortness of breath at rest but significant shortness of breath with minimal exertion and talking.  She denies any nausea, vomiting, or insomnia currently.   Extensive conversation with patient and family in person and daughter via phone.  Discussed that patient appears to be responding to IV antibiotics and IV steroids by the previously set 48-hour window mark and appears to be making some gradual improvements.  Clarified with the patient and family several times that the prognosis was not 24 to 48 hours as they had understood but rather, the timeframe provided reflected a period during which we will monitor for any potential changes  or outcomes, while continuing to focus on comfort and supportive care. We talked about the COPD disease trajectory and that she continues to be end-stage but that with a positive response to current therapies, this may indicate a more favorable prognosis than if she was not responding to therapy.  We then discussed her goals of care and advanced directives.  DNR/DNI/continue current plan is confirmed.  Patient's family is comfortable with her remaining DNR/DNI.  They share their experience of their father who passed away with COPD and how difficult it was to watch him be short of breath and suffer.    Patient continues to express desire to ultimately be comfortable at the end of life.  She has a lot of questions about her prognosis and I tried to answer patient and family including daughter's questions regarding prognosis to the best of my abilities although sharing that COPD can be somewhat unpredictable.  We discussed potentially her moving out of ICU into the floor and that we may begin thinking about where she will go after leaving the hospital.  Patient and family expressed concern about her ability to return home.  She lives in an apartment by herself and her closest family is about 30 minutes away and they are not available to provide 24-hour support.  Additionally, several of her sisters who help provide support are moving to [place]  in the coming days to weeks.  She has hospice which a nurse comes out with a chaplain about 3 times a week but she no longer has aides due to accusations of theft (per her family this is a fixed delusion that she has struggled with in the past).  Patient is extremely worried about suffering and feeling like she is suffocating at end of life.  Before she came to the hospital this last time she felt like she had to gasp for air and like she was going to suffocate.  She states that she just wants to be able to die peacefully.  She worries about going home and dying alone and  suffering.  Family is also worried about her ability to care for herself noting that she is not able to clean or cook and has had more more trouble with bathing and toileting herself and has had more falls.  They also note she continues to use a vape pen and does not take her medication correctly.  Discussed that given her goals of care she would be appropriate to remain under  hospice.  Discussed various locations where hospice care can occur.  Patient and family feel that she would be best cared for in an inpatient hospice setting.  However, discussed that she may or may not meet admission criteria for inpatient hospice. Could perhaps qualify for symptom management for a temporary time but then there would still need to be planning for where to go once symptoms stabilized.  We discussed potential option of SNF with hospice support.  However, patient states that she will not give her paycheck to someone else.  Home hospice remains an option if feasible.   Family gives some insight into her past and how difficult of her journey it has been for patient and others.  They are hesitant for SNF care as well because she has had previous experiences in congregate living settings and had significant issues.  Ultimately, family will remain supportive and help where they are able to and will work with patient to honor her wishes.   All questions answered.  Assured patient and family that we we will continue current measures and continue to work together to coordinate an appropriate discharge plan.  Provided with my phone number for callback should they have any further questions.  Chart review/care coordination:  Completed extensive chart review including EPIC notes, labs and (independently reviewed), medication administration record, vital signs. Coordinated care directly with chaplain, attending physician, bedside nursing staff, TOC, and hospice agency.  Also spoke at length with patient's brother and 2 sisters in  person with permission from patient and her daughter via phone.   Length of Stay: 2   Physical Exam Constitutional:      General: She is not in acute distress.    Comments: Chronically ill-appearing  Pulmonary:     Effort: Pulmonary effort is normal. No respiratory distress.     Comments: Shortness of breath noted with minimal exertion.  She does have to pause for breath even with speaking. Skin:    General: Skin is warm and dry.  Neurological:     Mental Status: She is alert.             Vital Signs: BP (!) 131/46   Pulse 84   Temp 98.2 F (36.8 C) (Oral)   Resp 15   Ht 5' 8 (1.727 m)   Wt 95.7 kg   SpO2 91%   BMI 32.08 kg/m  SpO2: SpO2: 91 % O2 Device: O2 Device: Nasal Cannula O2 Flow Rate: O2 Flow Rate (L/min): 3 L/min      Palliative Assessment/Data:40%   Palliative Care Assessment & Plan   Patient Profile/Assessment:  63 year old female with extensive medical history admitted on 11/04/2023 for acute on chronic respiratory failure in the setting of end-stage COPD.  Initial palliative visit and extensive goals of care and advance directive discussion completed on 11/04/2023.  Patient and daughter both updated and were considering transitioning to DNR/DNI, also considering treating the treatable versus comfort care.  Both requested time to discuss amongst himself before making any changes.  Followed up on 11/05/2023 and patient and daughter confirm that she is to transition to DNR/DNI and continued time for outcomes.  If no improvement or if any worsening in her clinical status then patient desires transfer to comfort care only.  Patient making some gradual improvements. GOC remain comfort/QOL, continue to treat the treatable and DNR/DNI. Plan if able is to discharge with hospice support.  Disposition location remains undetermined.   Recommendations/Plan: DNR/DNI Treat the treatable reassess daily to determine whether  or not to transition to full comfort Appreciate  ongoing spiritual care services Continue current symptom regimen as it appears effective Palliative medicine team will continue to follow for ongoing goals of care discussion, symptom management, and coordination of care.   Symptom management:  Continue Valium  p.o. 5 mg every 6 hours as needed Continue Zofran  4 mg every 6 hours as needed Continue Tylenol  650 mg p.o. every 6 hours as needed Continue oxycodone  5 mg p.o. every 4 hours as needed  Prognosis:  Guarded but slowly improving    Discharge Planning: To Be Determined    Detailed review of medical records (labs, imaging, vital signs), medically appropriate exam, discussed with treatment team, counseling and education to patient, family, & staff, documenting clinical information, medication management, coordination of care   Total time: I spent 90 minutes in the care of the patient today in the above activities and documenting the encounter.   Laymon CHRISTELLA Pinal, NP  Palliative Medicine Team Team phone # [phone]  Thank you for allowing the Palliative Medicine Team to assist in the care of this patient. Please utilize secure chat with additional questions, if there is no response within 30 minutes please call the above phone number.  Palliative Medicine Team providers are available by phone from 7am to 7pm daily and can be reached through the team cell phone.  Should this patient require assistance outside of these hours, please call the patient's attending physician.

## 2023-11-07 ENCOUNTER — Other Ambulatory Visit: Payer: Self-pay

## 2023-11-07 DIAGNOSIS — J449 Chronic obstructive pulmonary disease, unspecified: Secondary | ICD-10-CM

## 2023-11-07 DIAGNOSIS — Z789 Other specified health status: Secondary | ICD-10-CM

## 2023-11-07 DIAGNOSIS — Z7189 Other specified counseling: Secondary | ICD-10-CM

## 2023-11-07 DIAGNOSIS — Z515 Encounter for palliative care: Secondary | ICD-10-CM | POA: Diagnosis not present

## 2023-11-07 DIAGNOSIS — Z558 Other problems related to education and literacy: Secondary | ICD-10-CM

## 2023-11-07 DIAGNOSIS — Z66 Do not resuscitate: Secondary | ICD-10-CM

## 2023-11-07 LAB — CBC
HCT: 34.7 % — ABNORMAL LOW (ref 36.0–46.0)
HCT: 36.5 % (ref 36.0–46.0)
Hemoglobin: 10.5 g/dL — ABNORMAL LOW (ref 12.0–15.0)
Hemoglobin: 10.9 g/dL — ABNORMAL LOW (ref 12.0–15.0)
MCH: 27.6 pg (ref 26.0–34.0)
MCH: 27.1 pg (ref 26.0–34.0)
MCHC: 30.3 g/dL (ref 30.0–36.0)
MCHC: 29.9 g/dL — ABNORMAL LOW (ref 30.0–36.0)
MCV: 91.3 fL (ref 80.0–100.0)
MCV: 90.8 fL (ref 80.0–100.0)
Platelets: 229 K/uL (ref 150–400)
Platelets: 243 K/uL (ref 150–400)
RBC: 3.8 MIL/uL — ABNORMAL LOW (ref 3.87–5.11)
RBC: 4.02 MIL/uL (ref 3.87–5.11)
RDW: 15.5 % (ref 11.5–15.5)
RDW: 15.1 % (ref 11.5–15.5)
WBC: 27.8 K/uL — ABNORMAL HIGH (ref 4.0–10.5)
WBC: 15.4 K/uL — ABNORMAL HIGH (ref 4.0–10.5)
nRBC: 0 % (ref 0.0–0.2)
nRBC: 0 % (ref 0.0–0.2)

## 2023-11-07 LAB — BASIC METABOLIC PANEL WITH GFR
Anion gap: 9 (ref 5–15)
BUN: 26 mg/dL — ABNORMAL HIGH (ref 8–23)
CO2: 30 mmol/L (ref 22–32)
Calcium: 8.8 mg/dL — ABNORMAL LOW (ref 8.9–10.3)
Chloride: 101 mmol/L (ref 98–111)
Creatinine, Ser: 0.95 mg/dL (ref 0.44–1.00)
GFR, Estimated: >60 mL/min (ref >60)
Glucose, Bld: 341 mg/dL — ABNORMAL HIGH (ref 70–99)
Potassium: 3.7 mmol/L (ref 3.5–5.1)
Sodium: 140 mmol/L (ref 135–145)

## 2023-11-07 LAB — GLUCOSE, CAPILLARY
Glucose-Capillary: 401 mg/dL — ABNORMAL HIGH (ref 70–99)
Glucose-Capillary: 372 mg/dL — ABNORMAL HIGH (ref 70–99)
Glucose-Capillary: 385 mg/dL — ABNORMAL HIGH (ref 70–99)
Glucose-Capillary: 189 mg/dL — ABNORMAL HIGH (ref 70–99)
Glucose-Capillary: 91 mg/dL (ref 70–99)

## 2023-11-07 MED ORDER — BISACODYL 10 MG RE SUPP: 10.0000 mg | Freq: Every day | RECTAL | Status: DC | PRN

## 2023-11-07 MED ORDER — METHYLPREDNISOLONE SODIUM SUCC 125 MG IJ SOLR
40.0000 mg | Freq: Every day | INTRAMUSCULAR | Status: DC
  Administered 2023-11-08: 40 mg via INTRAVENOUS
  Filled 2023-11-07 (×2): qty 2

## 2023-11-07 MED ORDER — POLYETHYLENE GLYCOL 3350 17 G PO PACK
17.0000 g | PACK | Freq: Two times a day (BID) | ORAL | Status: DC
  Administered 2023-11-08 – 2023-11-10 (×5): 17 g via ORAL
  Filled 2023-11-07 (×5): qty 1

## 2023-11-07 NOTE — Progress Notes (Signed)
 CBG is  401.  20 units of novolog  given. Dr Vicci was made aware. Pt is very sleepy. SP02 is 100% on CPAP. Overnight she didn't sleep a lot but was more interactive, transferred self from bed to chair and ambulated to restroom. MD made aware of this as well. Kellogg RN

## 2023-11-07 NOTE — Plan of Care (Signed)
 Problem: Education: Goal: Knowledge of General Education information will improve Description: Including pain rating scale, medication(s)/side effects and non-pharmacologic comfort measures Outcome: Progressing   Problem: Health Behavior/Discharge Planning: Goal: Ability to manage health-related needs will improve Outcome: Progressing   Problem: Clinical Measurements: Goal: Ability to maintain clinical measurements within normal limits will improve Outcome: Progressing Goal: Will remain free from infection Outcome: Progressing Goal: Diagnostic test results will improve Outcome: Progressing Goal: Respiratory complications will improve Outcome: Progressing Goal: Cardiovascular complication will be avoided Outcome: Progressing   Problem: Activity: Goal: Risk for activity intolerance will decrease Outcome: Progressing   Problem: Nutrition: Goal: Adequate nutrition will be maintained Outcome: Progressing   Problem: Coping: Goal: Level of anxiety will decrease Outcome: Progressing   Problem: Elimination: Goal: Will not experience complications related to bowel motility Outcome: Progressing Goal: Will not experience complications related to urinary retention Outcome: Progressing   Problem: Pain Managment: Goal: General experience of comfort will improve and/or be controlled Outcome: Progressing   Problem: Safety: Goal: Ability to remain free from injury will improve Outcome: Progressing   Problem: Skin Integrity: Goal: Risk for impaired skin integrity will decrease Outcome: Progressing   Problem: Education: Goal: Ability to describe self-care measures that may prevent or decrease complications (Diabetes Survival Skills Education) will improve Outcome: Progressing Goal: Individualized Educational Video(s) Outcome: Progressing   Problem: Coping: Goal: Ability to adjust to condition or change in health will improve Outcome: Progressing   Problem: Fluid  Volume: Goal: Ability to maintain a balanced intake and output will improve Outcome: Progressing   Problem: Health Behavior/Discharge Planning: Goal: Ability to identify and utilize available resources and services will improve Outcome: Progressing Goal: Ability to manage health-related needs will improve Outcome: Progressing   Problem: Metabolic: Goal: Ability to maintain appropriate glucose levels will improve Outcome: Progressing   Problem: Nutritional: Goal: Maintenance of adequate nutrition will improve Outcome: Progressing Goal: Progress toward achieving an optimal weight will improve Outcome: Progressing   Problem: Skin Integrity: Goal: Risk for impaired skin integrity will decrease Outcome: Progressing   Problem: Tissue Perfusion: Goal: Adequacy of tissue perfusion will improve Outcome: Progressing   Problem: Education: Goal: Knowledge of disease or condition will improve Outcome: Progressing Goal: Knowledge of the prescribed therapeutic regimen will improve Outcome: Progressing Goal: Individualized Educational Video(s) Outcome: Progressing   Problem: Activity: Goal: Ability to tolerate increased activity will improve Outcome: Progressing Goal: Will verbalize the importance of balancing activity with adequate rest periods Outcome: Progressing   Problem: Respiratory: Goal: Ability to maintain a clear airway will improve Outcome: Progressing Goal: Levels of oxygenation will improve Outcome: Progressing Goal: Ability to maintain adequate ventilation will improve Outcome: Progressing   Problem: Education: Goal: Knowledge of disease or condition will improve Outcome: Progressing Goal: Knowledge of the prescribed therapeutic regimen will improve Outcome: Progressing Goal: Individualized Educational Video(s) Outcome: Progressing   Problem: Education: Goal: Ability to describe self-care measures that may prevent or decrease complications (Diabetes Survival  Skills Education) will improve Outcome: Progressing Goal: Individualized Educational Video(s) Outcome: Progressing   Problem: Coping: Goal: Ability to adjust to condition or change in health will improve Outcome: Progressing   Problem: Fluid Volume: Goal: Ability to maintain a balanced intake and output will improve Outcome: Progressing   Problem: Health Behavior/Discharge Planning: Goal: Ability to identify and utilize available resources and services will improve Outcome: Progressing Goal: Ability to manage health-related needs will improve Outcome: Progressing   Problem: Metabolic: Goal: Ability to maintain appropriate glucose levels will improve Outcome: Progressing  Problem: Nutritional: Goal: Maintenance of adequate nutrition will improve Outcome: Progressing Goal: Progress toward achieving an optimal weight will improve Outcome: Progressing   Problem: Skin Integrity: Goal: Risk for impaired skin integrity will decrease Outcome: Progressing   Problem: Tissue Perfusion: Goal: Adequacy of tissue perfusion will improve Outcome: Progressing

## 2023-11-07 NOTE — Progress Notes (Signed)
 PROGRESS NOTE   Joan [name]  FMW:[id number] DOB: [birth year] DOA: 11/04/2023 PCP: Rollene Almarie LABOR, MD   Chief Complaint  Patient presents with   Respiratory Distress   Level of care: Telemetry  Brief Admission History:  Joan [name] is a 63 year old female on home hospice with end-stage terminal COPD, chronic interstitial lung disease, chronic hypoxic respiratory failure-on O2 demand~5 L, CAD, h/o tobacco abuse, chest pains, HTN, anxiety, bipolar disorder, hypothyroidism, DM2, neuropathy, GERD, HLD, obesity, OSA, on CPAP, polypharmacy, PTSD, multiple hospitalization...SABRASABRA Presenting once again with shortness of breath and hypoxia.  Per EMS was complaining of shortness of breath and distress on 4 L at home was satting in 70s, on BiPAP improved to 94% after breathing treatments.   ED Evaluation: Blood pressure (!) 158/95, pulse (!) 137, temperature 98.2 F (36.8 C), resp. rate (!) 26, height 5' 6 (1.676 m), weight 95.7 kg, SpO2 99% on BiPAP.  VBG: pH 7.32/pCO2 78/pO2 34/bicarb 40.7 WBC 24.5 Respiratory panel RSV, influenza A/B's, COVID All negative  CXR : 1. Worsened bibasilar opacities, which is favored to represent interstitial edema. Superimposed infection not excluded. 2. Possible trace pleural effusions.   Requested patient to be admitted for acute on chronic respiratory failure, possible pneumonia   Assessment and Plan:  Acute hypoxic on chronic hypercapnic respiratory failure  -Patient will be admitted for acute on chronic respiratory failure to stepdown unit -Will continue BiPAP QHS -Maintaining O2 sat 88-92% -Continue with IV steroids, DuoNeb bronchodilators scheduled and as needed -Currently patient is [facility] on home hospice Continue discussion with patient, consulting palliative care to determine goals of care and CODE STATUS --pt is DNR and plan is for residential hospice placement at [facility] when bed is available.   Chronic interstitial  lung disease - Chronic interstitial lung disease with COPD and chronic respiratory failure with obstructive sleep apnea on CPAP nightly and 4-5 L continuous O2 at home Currently in acute on chronic respiratory failure, on BiPAP Management as above  Enrolled in home hospice care - appreciate palliative care team consultation - Continue discussion regarding CODE STATUS and goals of care  Bipolar disorder - Polypharmacy currently on Valium , BuSpar , Wellbutrin , Effexor  will--will review home medication and resume accordingly -Will try to avoid polypharmacy  Pneumonia -Presumed pneumonia on chest x-ray, with a COPD exacerbation -continue IV Levaquin , linezolid  for 5 days -Will obtain procalcitonin level, lactic acid -Cultures blood and sputum  Coronary artery disease - Continue statins beta-blockers, asa  Goals of care, counseling/discussion Full code-continue discussion  PAD (peripheral artery disease)  - Continue statin, aspirin   Controlled type 2 diabetes mellitus with neuropathy - Steroid induced hyperglycemia-induced diabetes - Anticipating hyperglycemia on high-dose steroids, checking CBG q. ACHS with SSI coverage - added semglee  18 units daily and novolog  8 units TID with meals eaten>50% CBG (last 3)  Recent Labs    11/07/23 0809 11/07/23 1059 11/07/23 1633  GLUCAP 372* 385* 189*   Acquired hypothyroidism Continue Synthroid   Depression History of depression/anxiety/bipolar - Polypharmacy review meds-try to modify current meds Which includes: Seroquel , Valium , BuSpar , Effexor ,  PTSD (post-traumatic stress disorder) Polypharmacy continue as needed Valium   Allergic rhinitis As needed to help histamine, continue Singulair   Cervical spondylosis without myelopathy As needed analgesics  Carotid artery disease - Will continue atenolol , statins,  OSA on CPAP Currently on BiPAP  GERD (gastroesophageal reflux disease) - Continue PPI  Mixed  hyperlipidemia Continue statins  Iron deficiency anemia due to dietary causes - Not on iron supplements, will monitor H&H  Chronic constipation - Will review, and resume bowel regimens  Essential hypertension - Continue atenolol , as needed hydralazine   Anxiety - Continue Valium  at lower dose (at home 5 mg, will continue with 2 mg 3 times daily as needed every 6 hours)   DVT prophylaxis: sq heparin   Code Status: DNR  Family Communication:  Disposition: TBD    Consultants:   Procedures:   Antimicrobials:    Subjective: Pt says she is feeling terrible.  She doesn't feel well. She remains very SOB.    Objective: Vitals:   11/07/23 0722 11/07/23 0805 11/07/23 1315 11/07/23 1424  BP:   109/61   Pulse:   71   Resp:   20   Temp:   98.5 F (36.9 C)   TempSrc:   Oral   SpO2: 100% 99% 96% 94%  Weight:      Height:        Intake/Output Summary (Last 24 hours) at 11/07/2023 1702 Last data filed at 11/07/2023 0900 Gross per 24 hour  Intake 720 ml  Output 1400 ml  Net -680 ml   Filed Weights   11/04/23 1416 11/05/23 0400 11/07/23 0500  Weight: 96.5 kg 95.7 kg 96.8 kg   Examination:  General exam: Appears chronically ill, calm and NAD.  Respiratory system: diffuse insp/exp wheezing and rales heard bilateral. Cardiovascular system: normal S1 & S2 heard. No JVD, murmurs, rubs, gallops or clicks. No pedal edema. Gastrointestinal system: Abdomen is nondistended, soft and nontender. No organomegaly or masses felt. Normal bowel sounds heard. Central nervous system: Alert and oriented. No focal neurological deficits. Extremities: Symmetric 5 x 5 power. Skin: No rashes, lesions or ulcers. Psychiatry: Judgement and insight appear normal. Mood & affect appropriate.   Data Reviewed: I have personally reviewed following labs and imaging studies  CBC: Recent Labs  Lab 11/04/23 1150 11/04/23 1342 11/05/23 0338 11/06/23 0405 11/07/23 0556  WBC 24.5* 21.6* 27.8* 21.7* 15.4*   NEUTROABS 22.1*  --   --   --   --   HGB 14.4 14.2 10.5* 9.8* 10.9*  HCT 48.3* 46.9* 34.7* 32.2* 36.5  MCV 93.8 92.7 91.3 90.4 90.8  PLT 315 260 229 201 243    Basic Metabolic Panel: Recent Labs  Lab 11/04/23 1150 11/04/23 1342 11/05/23 0338 11/06/23 0405 11/07/23 0556  NA 145  --  138 139 140  K 3.7  --  3.9 3.9 3.7  CL 99  --  99 100 101  CO2 31  --  29 29 30   GLUCOSE 146*  --  239* 236* 341*  BUN 15  --  18 24* 26*  CREATININE 0.93  --  0.93 0.89 0.95  CALCIUM  9.4  --  8.1* 8.4* 8.8*  MG  --  1.6*  --   --   --   PHOS  --  2.9  --   --   --     CBG: Recent Labs  Lab 11/06/23 2041 11/07/23 0707 11/07/23 0809 11/07/23 1059 11/07/23 1633  GLUCAP 265* 401* 372* 385* 189*    Recent Results (from the past 240 hours)  Resp panel by RT-PCR (RSV, Flu A&B, Covid) Anterior Nasal Swab     Status: None   Collection Time: 11/04/23 11:45 AM   Specimen: Anterior Nasal Swab  Result Value Ref Range Status   SARS Coronavirus 2 by RT PCR NEGATIVE NEGATIVE Final    Comment: (NOTE) SARS-CoV-2 target nucleic acids are NOT DETECTED.  The SARS-CoV-2 RNA is generally detectable in  upper respiratory specimens during the acute phase of infection. The lowest concentration of SARS-CoV-2 viral copies this assay can detect is 138 copies/mL. A negative result does not preclude SARS-Cov-2 infection and should not be used as the sole basis for treatment or other patient management decisions. A negative result may occur with  improper specimen collection/handling, submission of specimen other than nasopharyngeal swab, presence of viral mutation(s) within the areas targeted by this assay, and inadequate number of viral copies(<138 copies/mL). A negative result must be combined with clinical observations, patient history, and epidemiological information. The expected result is Negative.  Fact Sheet for Patients:  BloggerCourse.com  Fact Sheet for Healthcare  Providers:  SeriousBroker.it  This test is no t yet approved or cleared by the [place]  FDA and  has been authorized for detection and/or diagnosis of SARS-CoV-2 by FDA under an Emergency Use Authorization (EUA). This EUA will remain  in effect (meaning this test can be used) for the duration of the COVID-19 declaration under Section 564(b)(1) of the Act, 21 U.S.C.section 360bbb-3(b)(1), unless the authorization is terminated  or revoked sooner.       Influenza A by PCR NEGATIVE NEGATIVE Final   Influenza B by PCR NEGATIVE NEGATIVE Final    Comment: (NOTE) The Xpert Xpress SARS-CoV-2/FLU/RSV plus assay is intended as an aid in the diagnosis of influenza from Nasopharyngeal swab specimens and should not be used as a sole basis for treatment. Nasal washings and aspirates are unacceptable for Xpert Xpress SARS-CoV-2/FLU/RSV testing.  Fact Sheet for Patients: BloggerCourse.com  Fact Sheet for Healthcare Providers: SeriousBroker.it  This test is not yet approved or cleared by the [place]  FDA and has been authorized for detection and/or diagnosis of SARS-CoV-2 by FDA under an Emergency Use Authorization (EUA). This EUA will remain in effect (meaning this test can be used) for the duration of the COVID-19 declaration under Section 564(b)(1) of the Act, 21 U.S.C. section 360bbb-3(b)(1), unless the authorization is terminated or revoked.     Resp Syncytial Virus by PCR NEGATIVE NEGATIVE Final    Comment: (NOTE) Fact Sheet for Patients: BloggerCourse.com  Fact Sheet for Healthcare Providers: SeriousBroker.it  This test is not yet approved or cleared by the [place]  FDA and has been authorized for detection and/or diagnosis of SARS-CoV-2 by FDA under an Emergency Use Authorization (EUA). This EUA will remain in effect (meaning this test can be  used) for the duration of the COVID-19 declaration under Section 564(b)(1) of the Act, 21 U.S.C. section 360bbb-3(b)(1), unless the authorization is terminated or revoked.  Performed at [facility], [address] [place], [place] [address]   Expectorated Sputum Assessment w Gram Stain, Rflx to Resp Cult     Status: None   Collection Time: 11/04/23  1:18 PM   Specimen: Sputum  Result Value Ref Range Status   Specimen Description SPUTUM  Final   Special Requests NONE  Final   Sputum evaluation   Final    THIS SPECIMEN IS ACCEPTABLE FOR SPUTUM CULTURE Performed at [facility], [address] [place], [place] [address]    Report Status 11/04/2023 FINAL  Final  Culture, Respiratory w Gram Stain     Status: None (Preliminary result)   Collection Time: 11/04/23  1:18 PM   Specimen: SPU  Result Value Ref Range Status   Specimen Description   Final    SPUTUM Performed at [facility], [address], [place], [place] [address]    Special Requests   Final  NONE Reflexed from [id number] Performed at [facility], [address], [place], [place] [address]    Gram Stain   Final    RARE WBC PRESENT, PREDOMINANTLY PMN FEW GRAM POSITIVE COCCI RARE BUDDING YEAST SEEN    Culture   Final    FEW METHICILLIN RESISTANT STAPHYLOCOCCUS AUREUS CULTURE REINCUBATED FOR BETTER GROWTH Performed at [facility], [address], [place], [place] [address]    Report Status PENDING  Incomplete   Organism ID, Bacteria METHICILLIN RESISTANT STAPHYLOCOCCUS AUREUS  Final      Susceptibility   Methicillin resistant staphylococcus aureus - MIC*    CIPROFLOXACIN  >=8 RESISTANT Resistant     ERYTHROMYCIN >=8 RESISTANT Resistant     GENTAMICIN  <=0.5 SENSITIVE Sensitive     OXACILLIN >=4 RESISTANT Resistant     TETRACYCLINE <=1 SENSITIVE Sensitive     VANCOMYCIN  1 SENSITIVE Sensitive     TRIMETH/SULFA >=320 RESISTANT Resistant     CLINDAMYCIN  <=0.25 SENSITIVE Sensitive     RIFAMPIN <=0.5 SENSITIVE  Sensitive     Inducible Clindamycin  NEGATIVE Sensitive     LINEZOLID  2 SENSITIVE Sensitive     * FEW METHICILLIN RESISTANT STAPHYLOCOCCUS AUREUS  Blood Culture (routine x 2)     Status: None (Preliminary result)   Collection Time: 11/04/23  1:41 PM   Specimen: BLOOD  Result Value Ref Range Status   Specimen Description BLOOD BLOOD RIGHT HAND  Final   Special Requests   Final    BOTTLES DRAWN AEROBIC AND ANAEROBIC Blood Culture results may not be optimal due to an inadequate volume of blood received in culture bottles   Culture   Final    NO GROWTH 3 DAYS Performed at [facility], [address] [place], [place] [address]    Report Status PENDING  Incomplete  Blood Culture (routine x 2)     Status: None (Preliminary result)   Collection Time: 11/04/23  1:42 PM   Specimen: BLOOD  Result Value Ref Range Status   Specimen Description BLOOD LEFT HAND  Final   Special Requests   Final    BOTTLES DRAWN AEROBIC ONLY Blood Culture results may not be optimal due to an inadequate volume of blood received in culture bottles   Culture   Final    NO GROWTH 3 DAYS Performed at [name], [address], [place], [place] [address]    Report Status PENDING  Incomplete  MRSA Next Gen by PCR, Nasal     Status: Abnormal   Collection Time: 11/04/23  2:34 PM   Specimen: Nasal Mucosa; Nasal Swab  Result Value Ref Range Status   MRSA by PCR Next Gen DETECTED (A) NOT DETECTED Final    Comment: RESULT CALLED TO, READ BACK BY AND VERIFIED WITH: JAMIE BAILEY @ 1823 ON 11/04/23 C VARNER (NOTE) The GeneXpert MRSA Assay (FDA approved for NASAL specimens only), is one component of a comprehensive MRSA colonization surveillance program. It is not intended to diagnose MRSA infection nor to guide or monitor treatment for MRSA infections. Test performance is not FDA approved in patients less than 19 years old. Performed at [facility], [address] [place], [place] [address]      Radiology Studies: No  results found.   Scheduled Meds:  acyclovir   400 mg Oral BID   atenolol   25 mg Oral Daily   atorvastatin   20 mg Oral Daily   buPROPion   300 mg Oral Daily   busPIRone   5 mg Oral QHS  Chlorhexidine  Gluconate Cloth  6 each Topical Daily   furosemide   20 mg Oral Daily   guaiFENesin -dextromethorphan   10 mL Oral Q8H   heparin   5,000 Units Subcutaneous Q8H   insulin  aspart  0-20 Units Subcutaneous TID WC   insulin  aspart  0-5 Units Subcutaneous QHS   insulin  aspart  8 Units Subcutaneous TID WC   insulin  glargine  18 Units Subcutaneous Daily   ipratropium  0.5 mg Nebulization Q6H   levalbuterol   0.63 mg Nebulization Q6H   levothyroxine   25 mcg Oral QAC breakfast   linezolid   600 mg Oral Q12H   melatonin  6 mg Oral QHS   [START ON 11/08/2023] methylPREDNISolone  sodium succinate  40 mg Intravenous Daily   montelukast   10 mg Oral QHS   mupirocin  ointment   Nasal BID   pantoprazole   40 mg Oral Daily   polyethylene glycol  17 g Oral Daily   pregabalin   300 mg Oral BID   QUEtiapine   150 mg Oral QHS   roflumilast   500 mcg Oral Daily   senna-docusate  1 tablet Oral BID   sodium chloride  flush  3 mL Intravenous Q12H   sodium chloride  flush  3 mL Intravenous Q12H   tamsulosin   0.4 mg Oral Daily   venlafaxine  XR  150 mg Oral Q breakfast   Continuous Infusions:  levofloxacin  (LEVAQUIN ) IV 750 mg (11/07/23 1041)    LOS: 3 days   Time spent: 55 mins  Desirie Minteer Vicci, MD How to contact the [facility] Attending or Consulting provider 7A - 7P or covering provider during after hours 7P -7A, for this patient?  Check the care team in [facility] and look for a) attending/consulting [facility] provider listed and b) the [facility] team listed Log into [email or web address] to find provider on call.  Locate the [facility] provider you are looking for under [facility] and page to a number that you can be directly reached. If you still have difficulty reaching the provider, please page the [facility] (Director on Call) for the Hospitalists  listed on amion for assistance.  11/07/2023, 5:02 PM

## 2023-11-07 NOTE — Care Management Important Message (Signed)
 Important Message  Patient Details  Name: Joan [name] MRN: [id number] Date of Birth: 01/31/1961   Important Message Given:  Yes - Medicare IM     Zahra Peffley L Geni Skorupski 11/07/2023, 2:01 PM

## 2023-11-07 NOTE — Progress Notes (Signed)
 Daily Progress Note   Patient Name: Joan [name]       Date: 11/07/2023 DOB: [birth year]  Age: 63 y.o. MRN#: [id number] Attending Physician: Vicci Afton CROME, MD Primary Care Physician: Rollene Almarie LABOR, MD Admit Date: 11/04/2023  Reason for Consultation/Follow-up: Disposition and Establishing goals of care  Subjective:   63 y.o. female  with numerous chronic medical problems including but not limited end-stage terminal COPD, chronic interstitial lung disease, chronic hypoxic respiratory failure-on O2 demand~5 L, CAD, h/o tobacco abuse, chest pains, HTN, anxiety, bipolar disorder, hypothyroidism, DM2, neuropathy, GERD, HLD, obesity, OSA on CPAP, polypharmacy, PTSD, multiple hospitalizations  admitted on 11/04/2023 with shortness of breath and hypoxia.    Patient admitted for acute on chronic respiratory failure, COPD exacerbation, and possible pneumonia.  She required BiPAP therapy in the emergency department and was admitted to ICU.  Started on IV steroids, bronchodilators, and IV antibiotics.  It is noted that she is a home hospice patient but was listed as full code.   11/04/2023: Initial completed, extensive goals of care and advance care planning discussion held with patient and her daughter.  Patient expressed desire to transition to DNR/DNI, treat the treatable for 24 to 48 hours, and transition to comfort for any declines or if not improving.  However, she expressed she wanted me to follow-up with her daughter for making any decisions.  Followed up with daughter she wanted to talk with her mother and requested she remain full code/full scope until she was able to confirm this decision.   11/05/2023-followed up with patient in person and daughter via phone.  Close that is confirmed as DNR/DNI.  Plan is to continue aggressive  measures with IV antibiotics and steroids and allow time for outcomes.  11/06/2023: met with pt and family. Patient improving gradually. Lengthy discussion with all parties. Plan is to continue current measures, DNR/DNR, and ultimately with dispo under hospice although location remains unclear, hoping for IP hospice for symptom management.   Today, Labs independently reviewed.  Some hyperglycemia noted with glucose of 341.  Patient on sliding scale insulin  and has type 2 diabetes.  Creatinine and eGFR stable.  BUN with slight increase 26 today compared to 24 yesterday.  Continues to have mild hypocalcemia although improved from yesterday.  CBC reviewed.  White blood cell count trending down 15.4 today compared to 21.7 on 9/11.  Highest white count this admission 27.8.  Current antibiotic regimen appears effective.  On steroid so that is likely contributing to the elevated white blood cell count as well.  Hemoglobin stable compared to previous with some mild anemia.  On  medication administration record review, I see that she has had a total of 10 mg of oral Valium  over the past 24 hours and a total of 15 mg of oral oxycodone  over the past 24 hours.  Otherwise, no symptom meds required.  Vital signs reviewed and appear stable.  O2 sats appear maintained on baseline 3 L of O2 via nasal cannula.  Today, patient is sitting up in chair.  She reports she is still short of breath but feels better.  Overall starting to feel better.  She denies any pain currently and reports current pain regimen is effective.  She continues to have some shortness of breath.  We discussed transitioning to inpatient hospice for symptom management.  She states that she spoke to a hospice RN to visit this morning.  Patient reported she was feeling better and felt that she could go home as she had family coming into town who could stay with her initially.    Long conversation with daughter regarding her concerns regarding patient's ability  to return home safely including her ability to care for herself, take medications correctly, and that she experiences delusions.  It also appears that family is coming to visit but may or may not be planning on staying with her.  I also speak to patient's brother and sister.  The family is very concerned that patient is not safe to transition home at this point.  Collaborated with hospice nurse and shared concerns regarding patient's ability to return home as she would likely be alone.  Hospice RN will attempt to coordinate brief inpatient hospice today for symptom management therefore, I went back and talk to patient and her family at bedside.  Updated them on plan for brief inpatient hospice stay for symptom management.  Patient is agreeable.  Chart review/care coordination:  Completed extensive chart review including EPIC notes, labs (independently reviewed), MAR, vital signs. Coordinated care with attending physician, TOC, bedside nursing staff, and hospice liaison.  I also spoke with patient's daughter via phone and spoke with patient's brother and sister at bedside.  Extensive communication with hospice liaison.  Initially plan was to transition patient home.  However, shared my concerns regarding patient's ability to accurately take her symptom medicine as well as her baseline meds.  She will likely need some oversight at least initially and administering the controlled substances in order to ensure safety/optimal symptom management and monitoring to ensure she takes her daily medication.  Patient does live alone and has been having more trouble at home managing her day-to-day care.  Per family she often does not take her medicine and experiences some occasional delusions.  Hospice RN agrees and will follow-up.  Received return call and spoke with social work, updated plan is to admit patient to inpatient hospice once bed available for symptom management   Length of Stay: 3   Physical  Exam Constitutional:      General: She is not in acute distress.    Comments: Sitting up in chair  Pulmonary:     Effort: Pulmonary effort is normal. No respiratory distress.  Skin:    General: Skin is warm and dry.  Neurological:     Mental Status: She is alert.  Psychiatric:     Comments: Patient appears comfortable and does not appear to be responding to internal stimuli.  However, she does start showing me pictures on her phone of a couch and points to a small area in the crease of the couch trying to show  me a snake that she sees.  No snake visible in the picture.             Vital Signs: BP (!) 102/56 (BP Location: Right Arm)   Pulse 69   Temp 98.3 F (36.8 C)   Resp 18   Ht 5' 8 (1.727 m)   Wt 96.8 kg   SpO2 99%   BMI 32.45 kg/m  SpO2: SpO2: 99 % O2 Device: O2 Device: CPAP O2 Flow Rate: O2 Flow Rate (L/min): 3 L/min      Palliative Assessment/Data: 40-50%   Palliative Care Assessment & Plan   Patient Profile/Assessment:  62 year old female with extensive medical history admitted on 11/04/2023 for acute on chronic respiratory failure in the setting of end-stage COPD.  Initial palliative visit and extensive goals of care and advance directive discussion completed on 11/04/2023.  Patient and daughter both updated and were considering transitioning to DNR/DNI, also considering treating the treatable versus comfort care.  Both requested time to discuss amongst himself before making any changes.  Followed up on 11/05/2023 and patient and daughter confirm that she is to transition to DNR/DNI and continued time for outcomes.  If no improvement or if any worsening in her clinical status then patient desires transfer to comfort care only.  Patient making improvements. GOC remain comfort/QOL, continue to treat the treatable and DNR/DNI. Plan if able is to discharge to inpatient hospice for symptom management.    Recommendations/Plan:  DNR/DNI Continue to treat the treatable Once  stable for discharge, plan is to transition to inpatient hospice for symptom management Appreciate ongoing spiritual care services Continue current symptom regimen as it appears effective Palliative medicine team will continue to shadow for needs   Symptom management:  Continue Valium  p.o. 5 mg every 6 hours as needed Continue Zofran  4 mg every 6 hours as needed Continue Tylenol  650 mg p.o. every 6 hours as needed Continue oxycodone  5 mg p.o. every 4 hours as needed  Prognosis:  < 3 months    Discharge Planning: Hospice facility    Detailed review of medical records (labs, imaging, vital signs), medically appropriate exam, discussed with treatment team, counseling and education to patient, family, & staff, documenting clinical information, medication management, coordination of care   Total time: I spent 55 minutes in the care of the patient today in the above activities and documenting the encounter.    Laymon CHRISTELLA Pinal, NP  Palliative Medicine Team Team phone # [phone]  Thank you for allowing the Palliative Medicine Team to assist in the care of this patient. Please utilize secure chat with additional questions, if there is no response within 30 minutes please call the above phone number.  Palliative Medicine Team providers are available by phone from 7am to 7pm daily and can be reached through the team cell phone.  Should this patient require assistance outside of these hours, please call the patient's attending physician.

## 2023-11-07 NOTE — Progress Notes (Signed)
 PT Cancellation Note  Patient Details Name: Asaiah Hunnicutt MRN: [id number] DOB: 1960/03/31   Cancelled Treatment:    Reason Eval/Treat Not Completed: PT screened, no needs identified, will sign off. Patient discharged to care of nursing for ambulation daily as tolerated for length of stay.    Lacinda Fass, PT, DPT  11/07/2023, 1:37 PM

## 2023-11-07 NOTE — Plan of Care (Signed)

## 2023-11-08 DIAGNOSIS — Z7189 Other specified counseling: Secondary | ICD-10-CM | POA: Diagnosis not present

## 2023-11-08 DIAGNOSIS — Z66 Do not resuscitate: Secondary | ICD-10-CM | POA: Diagnosis not present

## 2023-11-08 DIAGNOSIS — J9601 Acute respiratory failure with hypoxia: Secondary | ICD-10-CM | POA: Diagnosis not present

## 2023-11-08 DIAGNOSIS — Z515 Encounter for palliative care: Secondary | ICD-10-CM | POA: Diagnosis not present

## 2023-11-08 DIAGNOSIS — J9612 Chronic respiratory failure with hypercapnia: Secondary | ICD-10-CM

## 2023-11-08 LAB — CULTURE, RESPIRATORY W GRAM STAIN

## 2023-11-08 LAB — BASIC METABOLIC PANEL WITH GFR
Anion gap: 11 (ref 5–15)
BUN: 30 mg/dL — ABNORMAL HIGH (ref 8–23)
CO2: 31 mmol/L (ref 22–32)
Calcium: 8.7 mg/dL — ABNORMAL LOW (ref 8.9–10.3)
Chloride: 100 mmol/L (ref 98–111)
Creatinine, Ser: 1.3 mg/dL — ABNORMAL HIGH (ref 0.44–1.00)
GFR, Estimated: 46 mL/min — ABNORMAL LOW (ref >60)
Glucose, Bld: 135 mg/dL — ABNORMAL HIGH (ref 70–99)
Potassium: 3.1 mmol/L — ABNORMAL LOW (ref 3.5–5.1)
Sodium: 142 mmol/L (ref 135–145)

## 2023-11-08 LAB — GLUCOSE, CAPILLARY
Glucose-Capillary: 145 mg/dL — ABNORMAL HIGH (ref 70–99)
Glucose-Capillary: 154 mg/dL — ABNORMAL HIGH (ref 70–99)
Glucose-Capillary: 153 mg/dL — ABNORMAL HIGH (ref 70–99)
Glucose-Capillary: 234 mg/dL — ABNORMAL HIGH (ref 70–99)
Glucose-Capillary: 217 mg/dL — ABNORMAL HIGH (ref 70–99)

## 2023-11-08 LAB — CBC
HCT: 33.5 % — ABNORMAL LOW (ref 36.0–46.0)
Hemoglobin: 10 g/dL — ABNORMAL LOW (ref 12.0–15.0)
MCH: 27.5 pg (ref 26.0–34.0)
MCHC: 29.9 g/dL — ABNORMAL LOW (ref 30.0–36.0)
MCV: 92 fL (ref 80.0–100.0)
Platelets: 231 K/uL (ref 150–400)
RBC: 3.64 MIL/uL — ABNORMAL LOW (ref 3.87–5.11)
RDW: 15.4 % (ref 11.5–15.5)
WBC: 16.5 K/uL — ABNORMAL HIGH (ref 4.0–10.5)
nRBC: 0 % (ref 0.0–0.2)

## 2023-11-08 MED ORDER — LEVOFLOXACIN 750 MG PO TABS
750.0000 mg | ORAL_TABLET | Freq: Every day | ORAL | Status: AC
  Administered 2023-11-09: 750 mg via ORAL
  Filled 2023-11-08: qty 1

## 2023-11-08 MED ORDER — INSULIN GLARGINE 100 UNIT/ML ~~LOC~~ SOLN
15.0000 [IU] | Freq: Every day | SUBCUTANEOUS | Status: DC
  Administered 2023-11-08 – 2023-11-10 (×3): 15 [IU] via SUBCUTANEOUS
  Filled 2023-11-08 (×6): qty 0.15

## 2023-11-08 MED ORDER — POTASSIUM CHLORIDE CRYS ER 20 MEQ PO TBCR
40.0000 meq | EXTENDED_RELEASE_TABLET | Freq: Once | ORAL | Status: AC
  Administered 2023-11-08: 40 meq via ORAL
  Filled 2023-11-08: qty 2

## 2023-11-08 MED ORDER — FUROSEMIDE 20 MG PO TABS
20.0000 mg | ORAL_TABLET | Freq: Every day | ORAL | Status: DC
  Administered 2023-11-10: 20 mg via ORAL
  Filled 2023-11-08: qty 1

## 2023-11-08 MED ORDER — OXYCODONE HCL 5 MG PO TABS
5.0000 mg | ORAL_TABLET | ORAL | Status: DC | PRN
  Administered 2023-11-08 – 2023-11-09 (×5): 10 mg via ORAL
  Filled 2023-11-08 (×6): qty 2

## 2023-11-08 MED ORDER — FENTANYL CITRATE PF 50 MCG/ML IJ SOSY
25.0000 ug | PREFILLED_SYRINGE | INTRAMUSCULAR | Status: DC | PRN
Start: 2023-11-08 — End: 2023-11-10
  Administered 2023-11-10: 25 ug via INTRAVENOUS
  Filled 2023-11-08: qty 1

## 2023-11-08 MED ORDER — INSULIN ASPART 100 UNIT/ML IJ SOLN
5.0000 [IU] | Freq: Three times a day (TID) | INTRAMUSCULAR | Status: DC
  Administered 2023-11-08 – 2023-11-10 (×7): 5 [IU] via SUBCUTANEOUS

## 2023-11-08 NOTE — Plan of Care (Signed)
 Problem: Education: Goal: Knowledge of General Education information will improve Description: Including pain rating scale, medication(s)/side effects and non-pharmacologic comfort measures Outcome: Progressing   Problem: Health Behavior/Discharge Planning: Goal: Ability to manage health-related needs will improve Outcome: Progressing   Problem: Clinical Measurements: Goal: Ability to maintain clinical measurements within normal limits will improve Outcome: Progressing Goal: Will remain free from infection Outcome: Progressing Goal: Diagnostic test results will improve Outcome: Progressing Goal: Respiratory complications will improve Outcome: Progressing Goal: Cardiovascular complication will be avoided Outcome: Progressing   Problem: Activity: Goal: Risk for activity intolerance will decrease Outcome: Progressing   Problem: Nutrition: Goal: Adequate nutrition will be maintained Outcome: Progressing   Problem: Coping: Goal: Level of anxiety will decrease Outcome: Progressing   Problem: Elimination: Goal: Will not experience complications related to bowel motility Outcome: Progressing Goal: Will not experience complications related to urinary retention Outcome: Progressing   Problem: Pain Managment: Goal: General experience of comfort will improve and/or be controlled Outcome: Progressing   Problem: Safety: Goal: Ability to remain free from injury will improve Outcome: Progressing   Problem: Skin Integrity: Goal: Risk for impaired skin integrity will decrease Outcome: Progressing   Problem: Education: Goal: Ability to describe self-care measures that may prevent or decrease complications (Diabetes Survival Skills Education) will improve Outcome: Progressing Goal: Individualized Educational Video(s) Outcome: Progressing   Problem: Coping: Goal: Ability to adjust to condition or change in health will improve Outcome: Progressing   Problem: Fluid  Volume: Goal: Ability to maintain a balanced intake and output will improve Outcome: Progressing   Problem: Health Behavior/Discharge Planning: Goal: Ability to identify and utilize available resources and services will improve Outcome: Progressing Goal: Ability to manage health-related needs will improve Outcome: Progressing   Problem: Metabolic: Goal: Ability to maintain appropriate glucose levels will improve Outcome: Progressing   Problem: Nutritional: Goal: Maintenance of adequate nutrition will improve Outcome: Progressing Goal: Progress toward achieving an optimal weight will improve Outcome: Progressing   Problem: Skin Integrity: Goal: Risk for impaired skin integrity will decrease Outcome: Progressing   Problem: Tissue Perfusion: Goal: Adequacy of tissue perfusion will improve Outcome: Progressing   Problem: Education: Goal: Knowledge of disease or condition will improve Outcome: Progressing Goal: Knowledge of the prescribed therapeutic regimen will improve Outcome: Progressing Goal: Individualized Educational Video(s) Outcome: Progressing   Problem: Activity: Goal: Ability to tolerate increased activity will improve Outcome: Progressing Goal: Will verbalize the importance of balancing activity with adequate rest periods Outcome: Progressing   Problem: Respiratory: Goal: Ability to maintain a clear airway will improve Outcome: Progressing Goal: Levels of oxygenation will improve Outcome: Progressing Goal: Ability to maintain adequate ventilation will improve Outcome: Progressing   Problem: Education: Goal: Knowledge of disease or condition will improve Outcome: Progressing Goal: Knowledge of the prescribed therapeutic regimen will improve Outcome: Progressing Goal: Individualized Educational Video(s) Outcome: Progressing   Problem: Education: Goal: Ability to describe self-care measures that may prevent or decrease complications (Diabetes Survival  Skills Education) will improve Outcome: Progressing Goal: Individualized Educational Video(s) Outcome: Progressing   Problem: Coping: Goal: Ability to adjust to condition or change in health will improve Outcome: Progressing   Problem: Fluid Volume: Goal: Ability to maintain a balanced intake and output will improve Outcome: Progressing   Problem: Health Behavior/Discharge Planning: Goal: Ability to identify and utilize available resources and services will improve Outcome: Progressing Goal: Ability to manage health-related needs will improve Outcome: Progressing   Problem: Metabolic: Goal: Ability to maintain appropriate glucose levels will improve Outcome: Progressing  Problem: Nutritional: Goal: Maintenance of adequate nutrition will improve Outcome: Progressing Goal: Progress toward achieving an optimal weight will improve Outcome: Progressing   Problem: Skin Integrity: Goal: Risk for impaired skin integrity will decrease Outcome: Progressing   Problem: Tissue Perfusion: Goal: Adequacy of tissue perfusion will improve Outcome: Progressing

## 2023-11-08 NOTE — Progress Notes (Signed)
   11/08/23 2105  BiPAP/CPAP/SIPAP  BiPAP/CPAP/SIPAP Pt Type Adult  BiPAP/CPAP/SIPAP DREAMSTATIOND  Mask Type Nasal mask  IPAP 10 cmH20  EPAP 4 cmH2O  Flow Rate 3 lpm  Patient Home Machine No  Patient Home Mask No  Patient Home Tubing No  Auto Titrate No  Device Plugged into [employer] Power Outlet Yes

## 2023-11-08 NOTE — Progress Notes (Signed)
 PROGRESS NOTE   Joan [name]  FMW:[id number] DOB: 1960-05-24 DOA: 11/04/2023 PCP: Rollene Almarie LABOR, MD   Chief Complaint  Patient presents with   Respiratory Distress   Level of care: Telemetry  Brief Admission History:  Joan [name] is a 63 year old female on home hospice with end-stage terminal COPD, chronic interstitial lung disease, chronic hypoxic respiratory failure-on O2 demand~5 L, CAD, h/o tobacco abuse, chest pains, HTN, anxiety, bipolar disorder, hypothyroidism, DM2, neuropathy, GERD, HLD, obesity, OSA, on CPAP, polypharmacy, PTSD, multiple hospitalization...SABRASABRA Presenting once again with shortness of breath and hypoxia.  Per EMS was complaining of shortness of breath and distress on 4 L at home was satting in 70s, on BiPAP improved to 94% after breathing treatments.   ED Evaluation: Blood pressure (!) 158/95, pulse (!) 137, temperature 98.2 F (36.8 C), resp. rate (!) 26, height 5' 6 (1.676 m), weight 95.7 kg, SpO2 99% on BiPAP.  VBG: pH 7.32/pCO2 78/pO2 34/bicarb 40.7 WBC 24.5 Respiratory panel RSV, influenza A/B's, COVID All negative  CXR : 1. Worsened bibasilar opacities, which is favored to represent interstitial edema. Superimposed infection not excluded. 2. Possible trace pleural effusions.   Requested patient to be admitted for acute on chronic respiratory failure, possible pneumonia   Assessment and Plan:  Acute hypoxic on chronic hypercapnic respiratory failure  -Patient will be admitted for acute on chronic respiratory failure to stepdown unit -Will continue BiPAP QHS -Maintaining O2 sat 88-92% -Continue with IV steroids, DuoNeb bronchodilators scheduled and as needed -Currently patient is [facility] on home hospice Continue discussion with patient, consulting palliative care to determine goals of care and CODE STATUS --pt is DNR and plan is for residential hospice placement at [facility] when bed is available.  --focus of care  transitioning to comfort and dignity with goal to control pain and dyspnea symptoms as able   Chronic interstitial lung disease - Chronic interstitial lung disease with COPD and chronic respiratory failure with obstructive sleep apnea on CPAP nightly and 4-5 L continuous O2 at home Currently in acute on chronic respiratory failure, on BiPAP Management as above --focus of care transitioning to comfort and dignity with goal to control pain and dyspnea symptoms as able   Enrolled in home hospice care - appreciate palliative care team consultation - Continue discussion regarding CODE STATUS and goals of care  Bipolar disorder - Polypharmacy currently on Valium , BuSpar , Wellbutrin , Effexor  will--will review home medication and resume accordingly -Will try to avoid polypharmacy  Pneumonia -Presumed pneumonia on chest x-ray, with a COPD exacerbation -continue levoflox/linezolid  for 5 days  Coronary artery disease - Continue statins beta-blockers, asa  Goals of care, counseling/discussion Full code-continue discussion  PAD (peripheral artery disease)  - Continue statin, aspirin   Controlled type 2 diabetes mellitus with neuropathy - Steroid induced hyperglycemia-induced diabetes - Anticipating hyperglycemia on high-dose steroids, checking CBG q. ACHS with SSI coverage - added semglee  18 units daily and novolog  8 units TID with meals eaten>50% CBG (last 3)  Recent Labs    11/08/23 0349 11/08/23 0737 11/08/23 1112  GLUCAP 145* 154* 153*   Acquired hypothyroidism Continue Synthroid   Depression History of depression/anxiety/bipolar - Polypharmacy review meds-try to modify current meds Which includes: Seroquel , Valium , BuSpar , Effexor ,  PTSD (post-traumatic stress disorder) Polypharmacy continue as needed Valium   Allergic rhinitis As needed to help histamine, continue Singulair   Cervical spondylosis without myelopathy As needed analgesics  Carotid artery disease - Will  continue atenolol , statins,  OSA on CPAP Currently on BiPAP  GERD (gastroesophageal  reflux disease) - Continue PPI  Mixed hyperlipidemia Continue statins  Iron deficiency anemia due to dietary causes - Not on iron supplements, will monitor H&H  Chronic constipation - Will review, and resume bowel regimens  Essential hypertension - Continue atenolol , as needed hydralazine   Anxiety - Continue Valium  at lower dose (at home 5 mg, will continue with 2 mg 3 times daily as needed every 6 hours)   DVT prophylaxis: sq heparin   Code Status: DNR  Family Communication:  Disposition: Transfer to [facility] on Mon/Tue when bed available     Consultants:   Procedures:   Antimicrobials:    Subjective: Pt uncomfortable having more pain and discomfort in her legs and requesting higher dose of pain medication  - she remains dyspneic despite aggressive therapies   Objective: Vitals:   11/08/23 0500 11/08/23 0810 11/08/23 1236 11/08/23 1339  BP:   126/87   Pulse:   91   Resp:   20   Temp:   98.6 F (37 C)   TempSrc:   Oral   SpO2:  92% 91% 92%  Weight: 103.2 kg     Height:        Intake/Output Summary (Last 24 hours) at 11/08/2023 1503 Last data filed at 11/08/2023 1303 Gross per 24 hour  Intake 1741.41 ml  Output 2150 ml  Net -408.59 ml   Filed Weights   11/05/23 0400 11/07/23 0500 11/08/23 0500  Weight: 95.7 kg 96.8 kg 103.2 kg   Examination:  General exam: Appears chronically ill, calm and NAD.  Respiratory system: improved insp/exp wheezing and rales heard bilateral. Cardiovascular system: normal S1 & S2 heard. No JVD, murmurs, rubs, gallops or clicks. No pedal edema. Gastrointestinal system: Abdomen is nondistended, soft and nontender. No organomegaly or masses felt. Normal bowel sounds heard. Central nervous system: Alert and oriented. No focal neurological deficits. Extremities: Symmetric 5 x 5 power. Skin: No rashes, lesions or ulcers. Psychiatry: Judgement  and insight appear normal. Mood & affect appropriate.   Data Reviewed: I have personally reviewed following labs and imaging studies  CBC: Recent Labs  Lab 11/04/23 1150 11/04/23 1342 11/05/23 0338 11/06/23 0405 11/07/23 0556 11/08/23 0512  WBC 24.5* 21.6* 27.8* 21.7* 15.4* 16.5*  NEUTROABS 22.1*  --   --   --   --   --   HGB 14.4 14.2 10.5* 9.8* 10.9* 10.0*  HCT 48.3* 46.9* 34.7* 32.2* 36.5 33.5*  MCV 93.8 92.7 91.3 90.4 90.8 92.0  PLT 315 260 229 201 243 231    Basic Metabolic Panel: Recent Labs  Lab 11/04/23 1150 11/04/23 1342 11/05/23 0338 11/06/23 0405 11/07/23 0556 11/08/23 0512  NA 145  --  138 139 140 142  K 3.7  --  3.9 3.9 3.7 3.1*  CL 99  --  99 100 101 100  CO2 31  --  29 29 30 31   GLUCOSE 146*  --  239* 236* 341* 135*  BUN 15  --  18 24* 26* 30*  CREATININE 0.93  --  0.93 0.89 0.95 1.30*  CALCIUM  9.4  --  8.1* 8.4* 8.8* 8.7*  MG  --  1.6*  --   --   --   --   PHOS  --  2.9  --   --   --   --     CBG: Recent Labs  Lab 11/07/23 1633 11/07/23 2046 11/08/23 0349 11/08/23 0737 11/08/23 1112  GLUCAP 189* 91 145* 154* 153*    Recent Results (from  the past 240 hours)  Resp panel by RT-PCR (RSV, Flu A&B, Covid) Anterior Nasal Swab     Status: None   Collection Time: 11/04/23 11:45 AM   Specimen: Anterior Nasal Swab  Result Value Ref Range Status   SARS Coronavirus 2 by RT PCR NEGATIVE NEGATIVE Final    Comment: (NOTE) SARS-CoV-2 target nucleic acids are NOT DETECTED.  The SARS-CoV-2 RNA is generally detectable in upper respiratory specimens during the acute phase of infection. The lowest concentration of SARS-CoV-2 viral copies this assay can detect is 138 copies/mL. A negative result does not preclude SARS-Cov-2 infection and should not be used as the sole basis for treatment or other patient management decisions. A negative result may occur with  improper specimen collection/handling, submission of specimen other than nasopharyngeal swab,  presence of viral mutation(s) within the areas targeted by this assay, and inadequate number of viral copies(<138 copies/mL). A negative result must be combined with clinical observations, patient history, and epidemiological information. The expected result is Negative.  Fact Sheet for Patients:  BloggerCourse.com  Fact Sheet for Healthcare Providers:  SeriousBroker.it  This test is no t yet approved or cleared by the [place]  FDA and  has been authorized for detection and/or diagnosis of SARS-CoV-2 by FDA under an Emergency Use Authorization (EUA). This EUA will remain  in effect (meaning this test can be used) for the duration of the COVID-19 declaration under Section 564(b)(1) of the Act, 21 U.S.C.section 360bbb-3(b)(1), unless the authorization is terminated  or revoked sooner.       Influenza A by PCR NEGATIVE NEGATIVE Final   Influenza B by PCR NEGATIVE NEGATIVE Final    Comment: (NOTE) The Xpert Xpress SARS-CoV-2/FLU/RSV plus assay is intended as an aid in the diagnosis of influenza from Nasopharyngeal swab specimens and should not be used as a sole basis for treatment. Nasal washings and aspirates are unacceptable for Xpert Xpress SARS-CoV-2/FLU/RSV testing.  Fact Sheet for Patients: BloggerCourse.com  Fact Sheet for Healthcare Providers: SeriousBroker.it  This test is not yet approved or cleared by the [place]  FDA and has been authorized for detection and/or diagnosis of SARS-CoV-2 by FDA under an Emergency Use Authorization (EUA). This EUA will remain in effect (meaning this test can be used) for the duration of the COVID-19 declaration under Section 564(b)(1) of the Act, 21 U.S.C. section 360bbb-3(b)(1), unless the authorization is terminated or revoked.     Resp Syncytial Virus by PCR NEGATIVE NEGATIVE Final    Comment: (NOTE) Fact Sheet for  Patients: BloggerCourse.com  Fact Sheet for Healthcare Providers: SeriousBroker.it  This test is not yet approved or cleared by the [place]  FDA and has been authorized for detection and/or diagnosis of SARS-CoV-2 by FDA under an Emergency Use Authorization (EUA). This EUA will remain in effect (meaning this test can be used) for the duration of the COVID-19 declaration under Section 564(b)(1) of the Act, 21 U.S.C. section 360bbb-3(b)(1), unless the authorization is terminated or revoked.  Performed at [facility], [address], [name], [place] [address]   Expectorated Sputum Assessment w Gram Stain, Rflx to Resp Cult     Status: None   Collection Time: 11/04/23  1:18 PM   Specimen: Sputum  Result Value Ref Range Status   Specimen Description SPUTUM  Final   Special Requests NONE  Final   Sputum evaluation   Final    THIS SPECIMEN IS ACCEPTABLE FOR SPUTUM CULTURE Performed at [facility], [address], [place], [place] [address]  Report Status 11/04/2023 FINAL  Final  Culture, Respiratory w Gram Stain     Status: None (Preliminary result)   Collection Time: 11/04/23  1:18 PM   Specimen: SPU  Result Value Ref Range Status   Specimen Description   Final    SPUTUM Performed at [facility], [address] [place], [place] [address]    Special Requests   Final    NONE Reflexed from [id number] Performed at [facility], [address], [place], [place] [address]    Gram Stain   Final    RARE WBC PRESENT, PREDOMINANTLY PMN FEW GRAM POSITIVE COCCI RARE BUDDING YEAST SEEN    Culture   Final    FEW METHICILLIN RESISTANT STAPHYLOCOCCUS AUREUS FEW YEAST IDENTIFICATION TO FOLLOW Performed at [facility], [address] [place], [place] [address]    Report Status PENDING  Incomplete   Organism ID, Bacteria METHICILLIN RESISTANT STAPHYLOCOCCUS AUREUS  Final      Susceptibility   Methicillin resistant staphylococcus  aureus - MIC*    CIPROFLOXACIN  >=8 RESISTANT Resistant     ERYTHROMYCIN >=8 RESISTANT Resistant     GENTAMICIN  <=0.5 SENSITIVE Sensitive     OXACILLIN >=4 RESISTANT Resistant     TETRACYCLINE <=1 SENSITIVE Sensitive     VANCOMYCIN  1 SENSITIVE Sensitive     TRIMETH/SULFA >=320 RESISTANT Resistant     CLINDAMYCIN  <=0.25 SENSITIVE Sensitive     RIFAMPIN <=0.5 SENSITIVE Sensitive     Inducible Clindamycin  NEGATIVE Sensitive     LINEZOLID  2 SENSITIVE Sensitive     * FEW METHICILLIN RESISTANT STAPHYLOCOCCUS AUREUS  Blood Culture (routine x 2)     Status: None (Preliminary result)   Collection Time: 11/04/23  1:41 PM   Specimen: BLOOD  Result Value Ref Range Status   Specimen Description BLOOD BLOOD RIGHT HAND  Final   Special Requests   Final    BOTTLES DRAWN AEROBIC AND ANAEROBIC Blood Culture results may not be optimal due to an inadequate volume of blood received in culture bottles   Culture   Final    NO GROWTH 4 DAYS Performed at [facility], [address] [place], [place] [address]    Report Status PENDING  Incomplete  Blood Culture (routine x 2)     Status: None (Preliminary result)   Collection Time: 11/04/23  1:42 PM   Specimen: BLOOD  Result Value Ref Range Status   Specimen Description BLOOD LEFT HAND  Final   Special Requests   Final    BOTTLES DRAWN AEROBIC ONLY Blood Culture results may not be optimal due to an inadequate volume of blood received in culture bottles   Culture   Final    NO GROWTH 4 DAYS Performed at [facility], [address], [place], [place] [address]    Report Status PENDING  Incomplete  MRSA Next Gen by PCR, Nasal     Status: Abnormal   Collection Time: 11/04/23  2:34 PM   Specimen: Nasal Mucosa; Nasal Swab  Result Value Ref Range Status   MRSA by PCR Next Gen DETECTED (A) NOT DETECTED Final    Comment: RESULT CALLED TO, READ BACK BY AND VERIFIED WITH: JAMIE BAILEY @ 1823 ON 11/04/23 C VARNER (NOTE) The GeneXpert MRSA Assay (FDA approved for  NASAL specimens only), is one component of a comprehensive MRSA colonization surveillance program. It is not intended to diagnose MRSA infection nor to guide or monitor treatment for MRSA infections. Test performance is not FDA approved in patients  less than 61 years old. Performed at [facility], [address], [place], [place] [address]      Radiology Studies: No results found.   Scheduled Meds:  acyclovir   400 mg Oral BID   atenolol   25 mg Oral Daily   atorvastatin   20 mg Oral Daily   buPROPion   300 mg Oral Daily   busPIRone   5 mg Oral QHS   Chlorhexidine  Gluconate Cloth  6 each Topical Daily   [START ON 11/10/2023] furosemide   20 mg Oral Daily   guaiFENesin -dextromethorphan   10 mL Oral Q8H   heparin   5,000 Units Subcutaneous Q8H   insulin  aspart  0-20 Units Subcutaneous TID WC   insulin  aspart  0-5 Units Subcutaneous QHS   insulin  aspart  5 Units Subcutaneous TID WC   insulin  glargine  15 Units Subcutaneous Daily   ipratropium  0.5 mg Nebulization Q6H   levalbuterol   0.63 mg Nebulization Q6H   [START ON 11/09/2023] levofloxacin   750 mg Oral Daily   levothyroxine   25 mcg Oral QAC breakfast   linezolid   600 mg Oral Q12H   melatonin  6 mg Oral QHS   methylPREDNISolone  sodium succinate  40 mg Intravenous Daily   montelukast   10 mg Oral QHS   mupirocin  ointment   Nasal BID   pantoprazole   40 mg Oral Daily   polyethylene glycol  17 g Oral BID   pregabalin   300 mg Oral BID   QUEtiapine   150 mg Oral QHS   roflumilast   500 mcg Oral Daily   senna-docusate  1 tablet Oral BID   sodium chloride  flush  3 mL Intravenous Q12H   sodium chloride  flush  3 mL Intravenous Q12H   tamsulosin   0.4 mg Oral Daily   venlafaxine  XR  150 mg Oral Q breakfast   Continuous Infusions:    LOS: 4 days   Time spent: 55 mins  Carrina Schoenberger Vicci, MD How to contact the [facility] Attending or Consulting provider 7A - 7P or covering provider during after hours 7P -7A, for this patient?  Check the care team  in [facility] and look for a) attending/consulting [facility] provider listed and b) the TR[facility] team listed Log into [email or web address] to find provider on call.  Locate the [facility] provider you are looking for under [facility] and page to a number that you can be directly reached. If you still have difficulty reaching the provider, please page the [facility] (Director on Call) for the Hospitalists listed on amion for assistance.  11/08/2023, 3:03 PM

## 2023-11-09 DIAGNOSIS — Z7189 Other specified counseling: Secondary | ICD-10-CM | POA: Diagnosis not present

## 2023-11-09 DIAGNOSIS — Z515 Encounter for palliative care: Secondary | ICD-10-CM | POA: Diagnosis not present

## 2023-11-09 LAB — GLUCOSE, CAPILLARY
Glucose-Capillary: 177 mg/dL — ABNORMAL HIGH (ref 70–99)
Glucose-Capillary: 230 mg/dL — ABNORMAL HIGH (ref 70–99)
Glucose-Capillary: 184 mg/dL — ABNORMAL HIGH (ref 70–99)
Glucose-Capillary: 190 mg/dL — ABNORMAL HIGH (ref 70–99)
Glucose-Capillary: 212 mg/dL — ABNORMAL HIGH (ref 70–99)

## 2023-11-09 LAB — CULTURE, BLOOD (ROUTINE X 2)
Culture: NO GROWTH
Culture: NO GROWTH

## 2023-11-09 MED ORDER — METHYLPREDNISOLONE SODIUM SUCC 125 MG IJ SOLR
20.0000 mg | Freq: Every day | INTRAMUSCULAR | Status: DC
  Administered 2023-11-09 – 2023-11-10 (×2): 20 mg via INTRAVENOUS
  Filled 2023-11-09: qty 2

## 2023-11-09 NOTE — Plan of Care (Signed)
 Problem: Education: Goal: Knowledge of General Education information will improve Description: Including pain rating scale, medication(s)/side effects and non-pharmacologic comfort measures Outcome: Progressing   Problem: Health Behavior/Discharge Planning: Goal: Ability to manage health-related needs will improve Outcome: Progressing   Problem: Clinical Measurements: Goal: Ability to maintain clinical measurements within normal limits will improve Outcome: Progressing Goal: Will remain free from infection Outcome: Progressing Goal: Diagnostic test results will improve Outcome: Progressing Goal: Respiratory complications will improve Outcome: Progressing Goal: Cardiovascular complication will be avoided Outcome: Progressing   Problem: Activity: Goal: Risk for activity intolerance will decrease Outcome: Progressing   Problem: Nutrition: Goal: Adequate nutrition will be maintained Outcome: Progressing   Problem: Coping: Goal: Level of anxiety will decrease Outcome: Progressing   Problem: Elimination: Goal: Will not experience complications related to bowel motility Outcome: Progressing Goal: Will not experience complications related to urinary retention Outcome: Progressing   Problem: Pain Managment: Goal: General experience of comfort will improve and/or be controlled Outcome: Progressing   Problem: Safety: Goal: Ability to remain free from injury will improve Outcome: Progressing   Problem: Skin Integrity: Goal: Risk for impaired skin integrity will decrease Outcome: Progressing   Problem: Education: Goal: Ability to describe self-care measures that may prevent or decrease complications (Diabetes Survival Skills Education) will improve Outcome: Progressing Goal: Individualized Educational Video(s) Outcome: Progressing   Problem: Coping: Goal: Ability to adjust to condition or change in health will improve Outcome: Progressing   Problem: Fluid  Volume: Goal: Ability to maintain a balanced intake and output will improve Outcome: Progressing   Problem: Health Behavior/Discharge Planning: Goal: Ability to identify and utilize available resources and services will improve Outcome: Progressing Goal: Ability to manage health-related needs will improve Outcome: Progressing   Problem: Metabolic: Goal: Ability to maintain appropriate glucose levels will improve Outcome: Progressing   Problem: Nutritional: Goal: Maintenance of adequate nutrition will improve Outcome: Progressing Goal: Progress toward achieving an optimal weight will improve Outcome: Progressing   Problem: Skin Integrity: Goal: Risk for impaired skin integrity will decrease Outcome: Progressing   Problem: Tissue Perfusion: Goal: Adequacy of tissue perfusion will improve Outcome: Progressing   Problem: Education: Goal: Knowledge of disease or condition will improve Outcome: Progressing Goal: Knowledge of the prescribed therapeutic regimen will improve Outcome: Progressing Goal: Individualized Educational Video(s) Outcome: Progressing   Problem: Activity: Goal: Ability to tolerate increased activity will improve Outcome: Progressing Goal: Will verbalize the importance of balancing activity with adequate rest periods Outcome: Progressing   Problem: Respiratory: Goal: Ability to maintain a clear airway will improve Outcome: Progressing Goal: Levels of oxygenation will improve Outcome: Progressing Goal: Ability to maintain adequate ventilation will improve Outcome: Progressing   Problem: Education: Goal: Knowledge of disease or condition will improve Outcome: Progressing Goal: Knowledge of the prescribed therapeutic regimen will improve Outcome: Progressing Goal: Individualized Educational Video(s) Outcome: Progressing   Problem: Education: Goal: Ability to describe self-care measures that may prevent or decrease complications (Diabetes Survival  Skills Education) will improve Outcome: Progressing Goal: Individualized Educational Video(s) Outcome: Progressing   Problem: Coping: Goal: Ability to adjust to condition or change in health will improve Outcome: Progressing   Problem: Fluid Volume: Goal: Ability to maintain a balanced intake and output will improve Outcome: Progressing   Problem: Health Behavior/Discharge Planning: Goal: Ability to identify and utilize available resources and services will improve Outcome: Progressing Goal: Ability to manage health-related needs will improve Outcome: Progressing   Problem: Metabolic: Goal: Ability to maintain appropriate glucose levels will improve Outcome: Progressing  Problem: Nutritional: Goal: Maintenance of adequate nutrition will improve Outcome: Progressing Goal: Progress toward achieving an optimal weight will improve Outcome: Progressing   Problem: Skin Integrity: Goal: Risk for impaired skin integrity will decrease Outcome: Progressing   Problem: Tissue Perfusion: Goal: Adequacy of tissue perfusion will improve Outcome: Progressing

## 2023-11-09 NOTE — Progress Notes (Signed)
 PROGRESS NOTE   Joan [name]  FMW:[id number] DOB: February 23, 1961 DOA: 11/04/2023 PCP: Rollene Almarie LABOR, MD   Chief Complaint  Patient presents with   Respiratory Distress   Level of care: Telemetry  Brief Admission History:  Joan [name] is a 63 year old female on home hospice with end-stage terminal COPD, chronic interstitial lung disease, chronic hypoxic respiratory failure-on O2 demand~5 L, CAD, h/o tobacco abuse, chest pains, HTN, anxiety, bipolar disorder, hypothyroidism, DM2, neuropathy, GERD, HLD, obesity, OSA, on CPAP, polypharmacy, PTSD, multiple hospitalization...SABRASABRA Presenting once again with shortness of breath and hypoxia.  Per EMS was complaining of shortness of breath and distress on 4 L at home was satting in 70s, on BiPAP improved to 94% after breathing treatments.   ED Evaluation: Blood pressure (!) 158/95, pulse (!) 137, temperature 98.2 F (36.8 C), resp. rate (!) 26, height 5' 6 (1.676 m), weight 95.7 kg, SpO2 99% on BiPAP.  VBG: pH 7.32/pCO2 78/pO2 34/bicarb 40.7 WBC 24.5 Respiratory panel RSV, influenza A/B's, COVID All negative  CXR : 1. Worsened bibasilar opacities, which is favored to represent interstitial edema. Superimposed infection not excluded. 2. Possible trace pleural effusions.   Requested patient to be admitted for acute on chronic respiratory failure, possible pneumonia   Assessment and Plan:  Acute hypoxic on chronic hypercapnic respiratory failure  -Patient will be admitted for acute on chronic respiratory failure to stepdown unit -Will continue BiPAP QHS -Maintaining O2 sat 88-92% -Continue with IV steroids, DuoNeb bronchodilators scheduled and as needed -Currently patient is [facility] on home hospice --pt is DNR and plan is for residential hospice placement at [facility] when bed is available.  --focus of care transitioning to comfort and dignity with goal to control pain and dyspnea symptoms as able   Chronic  interstitial lung disease - Chronic interstitial lung disease with COPD and chronic respiratory failure with obstructive sleep apnea on CPAP nightly and 4-5 L continuous O2 at home Currently in acute on chronic respiratory failure, on BiPAP Management as above --focus of care transitioning to comfort and dignity with goal to control pain and dyspnea symptoms as able   Enrolled in home hospice care - appreciate palliative care team consultation - Continue discussion regarding CODE STATUS and goals of care  Bipolar disorder - currently on Valium , BuSpar , Wellbutrin , Effexor    Pneumonia -Presumed pneumonia on chest x-ray, with a COPD exacerbation -continue levoflox/linezolid  for 5 days  Coronary artery disease - Continue statins beta-blockers, asa  Goals of care, counseling/discussion Full code-continue discussion  PAD (peripheral artery disease)  - Continue statin, aspirin   Controlled type 2 diabetes mellitus with neuropathy - Steroid induced hyperglycemia-induced diabetes - Anticipating hyperglycemia on high-dose steroids, checking CBG q. ACHS with SSI coverage - added semglee  18 units daily and novolog  8 units TID with meals eaten>50% CBG (last 3)  Recent Labs    11/09/23 0437 11/09/23 0714 11/09/23 1122  GLUCAP 177* 230* 184*   Acquired hypothyroidism Continue Synthroid   Depression History of depression/anxiety/bipolar - Polypharmacy review meds-try to modify current meds Which includes: Seroquel , Valium , BuSpar , Effexor ,  PTSD (post-traumatic stress disorder) Polypharmacy continue as needed Valium   Allergic rhinitis As needed to help histamine, continue Singulair   Cervical spondylosis without myelopathy As needed analgesics  Carotid artery disease - Will continue atenolol , statins,  OSA on CPAP Currently on BiPAP  GERD (gastroesophageal reflux disease) - Continue PPI  Mixed hyperlipidemia Continue statins  Iron deficiency anemia due to dietary  causes - Not on iron supplements, will monitor H&H  Chronic constipation - Will review, and resume bowel regimens  Essential hypertension - Continue atenolol , as needed hydralazine   Anxiety - Continue Valium  at lower dose (at home 5 mg, will continue with 2 mg 3 times daily as needed every 6 hours)   DVT prophylaxis: sq heparin   Code Status: DNR  Family Communication:  Disposition: Transfer to [facility] on Mon/Tue when bed available     Consultants:   Procedures:   Antimicrobials:    Subjective: Pt reports she has had a lot of pain but higher dose of pain meds has been helping.  She remains very SOB. She is agreeable to going to [facility] when bed ready.   Objective: Vitals:   11/08/23 2137 11/09/23 0432 11/09/23 0439 11/09/23 0746  BP: (!) 150/76 115/75    Pulse: 95 86    Resp: 20 20    Temp: 97.6 F (36.4 C) 97.9 F (36.6 C)    TempSrc: Oral     SpO2: 93% 95%  90%  Weight:   103.1 kg   Height:        Intake/Output Summary (Last 24 hours) at 11/09/2023 1319 Last data filed at 11/09/2023 0500 Gross per 24 hour  Intake 1206 ml  Output 1850 ml  Net -644 ml   Filed Weights   11/07/23 0500 11/08/23 0500 11/09/23 0439  Weight: 96.8 kg 103.2 kg 103.1 kg   Examination:  General exam: Appears chronically ill, calm and NAD.  Respiratory system: improved insp/exp wheezing and rales heard bilateral. Cardiovascular system: normal S1 & S2 heard. No JVD, murmurs, rubs, gallops or clicks. No pedal edema. Gastrointestinal system: Abdomen is nondistended, soft and nontender. No organomegaly or masses felt. Normal bowel sounds heard. Central nervous system: Alert and oriented. No focal neurological deficits. Extremities: Symmetric 5 x 5 power. Skin: No rashes, lesions or ulcers. Psychiatry: Judgement and insight appear normal. Mood & affect appropriate.   Data Reviewed: I have personally reviewed following labs and imaging studies  CBC: Recent Labs  Lab  11/04/23 1150 11/04/23 1342 11/05/23 0338 11/06/23 0405 11/07/23 0556 11/08/23 0512  WBC 24.5* 21.6* 27.8* 21.7* 15.4* 16.5*  NEUTROABS 22.1*  --   --   --   --   --   HGB 14.4 14.2 10.5* 9.8* 10.9* 10.0*  HCT 48.3* 46.9* 34.7* 32.2* 36.5 33.5*  MCV 93.8 92.7 91.3 90.4 90.8 92.0  PLT 315 260 229 201 243 231    Basic Metabolic Panel: Recent Labs  Lab 11/04/23 1150 11/04/23 1342 11/05/23 0338 11/06/23 0405 11/07/23 0556 11/08/23 0512  NA 145  --  138 139 140 142  K 3.7  --  3.9 3.9 3.7 3.1*  CL 99  --  99 100 101 100  CO2 31  --  29 29 30 31   GLUCOSE 146*  --  239* 236* 341* 135*  BUN 15  --  18 24* 26* 30*  CREATININE 0.93  --  0.93 0.89 0.95 1.30*  CALCIUM  9.4  --  8.1* 8.4* 8.8* 8.7*  MG  --  1.6*  --   --   --   --   PHOS  --  2.9  --   --   --   --     CBG: Recent Labs  Lab 11/08/23 1613 11/08/23 2135 11/09/23 0437 11/09/23 0714 11/09/23 1122  GLUCAP 234* 217* 177* 230* 184*    Recent Results (from the past 240 hours)  Resp panel by RT-PCR (RSV, Flu A&B, Covid) Anterior  Nasal Swab     Status: None   Collection Time: 11/04/23 11:45 AM   Specimen: Anterior Nasal Swab  Result Value Ref Range Status   SARS Coronavirus 2 by RT PCR NEGATIVE NEGATIVE Final    Comment: (NOTE) SARS-CoV-2 target nucleic acids are NOT DETECTED.  The SARS-CoV-2 RNA is generally detectable in upper respiratory specimens during the acute phase of infection. The lowest concentration of SARS-CoV-2 viral copies this assay can detect is 138 copies/mL. A negative result does not preclude SARS-Cov-2 infection and should not be used as the sole basis for treatment or other patient management decisions. A negative result may occur with  improper specimen collection/handling, submission of specimen other than nasopharyngeal swab, presence of viral mutation(s) within the areas targeted by this assay, and inadequate number of viral copies(<138 copies/mL). A negative result must be combined  with clinical observations, patient history, and epidemiological information. The expected result is Negative.  Fact Sheet for Patients:  BloggerCourse.com  Fact Sheet for Healthcare Providers:  SeriousBroker.it  This test is no t yet approved or cleared by the [place]  FDA and  has been authorized for detection and/or diagnosis of SARS-CoV-2 by FDA under an Emergency Use Authorization (EUA). This EUA will remain  in effect (meaning this test can be used) for the duration of the COVID-19 declaration under Section 564(b)(1) of the Act, 21 U.S.C.section 360bbb-3(b)(1), unless the authorization is terminated  or revoked sooner.       Influenza A by PCR NEGATIVE NEGATIVE Final   Influenza B by PCR NEGATIVE NEGATIVE Final    Comment: (NOTE) The Xpert Xpress SARS-CoV-2/FLU/RSV plus assay is intended as an aid in the diagnosis of influenza from Nasopharyngeal swab specimens and should not be used as a sole basis for treatment. Nasal washings and aspirates are unacceptable for Xpert Xpress SARS-CoV-2/FLU/RSV testing.  Fact Sheet for Patients: BloggerCourse.com  Fact Sheet for Healthcare Providers: SeriousBroker.it  This test is not yet approved or cleared by the [place]  FDA and has been authorized for detection and/or diagnosis of SARS-CoV-2 by FDA under an Emergency Use Authorization (EUA). This EUA will remain in effect (meaning this test can be used) for the duration of the COVID-19 declaration under Section 564(b)(1) of the Act, 21 U.S.C. section 360bbb-3(b)(1), unless the authorization is terminated or revoked.     Resp Syncytial Virus by PCR NEGATIVE NEGATIVE Final    Comment: (NOTE) Fact Sheet for Patients: BloggerCourse.com  Fact Sheet for Healthcare Providers: SeriousBroker.it  This test is not yet approved  or cleared by the [place]  FDA and has been authorized for detection and/or diagnosis of SARS-CoV-2 by FDA under an Emergency Use Authorization (EUA). This EUA will remain in effect (meaning this test can be used) for the duration of the COVID-19 declaration under Section 564(b)(1) of the Act, 21 U.S.C. section 360bbb-3(b)(1), unless the authorization is terminated or revoked.  Performed at [facility], [address] [place], [place] [address]   Expectorated Sputum Assessment w Gram Stain, Rflx to Resp Cult     Status: None   Collection Time: 11/04/23  1:18 PM   Specimen: Sputum  Result Value Ref Range Status   Specimen Description SPUTUM  Final   Special Requests NONE  Final   Sputum evaluation   Final    THIS SPECIMEN IS ACCEPTABLE FOR SPUTUM CULTURE Performed at [facility], [address], [place], [place] [address]    Report Status 11/04/2023 FINAL  Final  Culture, Respiratory w Gram Stain  Status: None   Collection Time: 11/04/23  1:18 PM   Specimen: SPU  Result Value Ref Range Status   Specimen Description   Final    SPUTUM Performed at [facility], [address] [place], [place] [address]    Special Requests   Final    NONE Reflexed from [id number] Performed at [facility], [address] [place], [place] [address]    Gram Stain   Final    RARE WBC PRESENT, PREDOMINANTLY PMN FEW GRAM POSITIVE COCCI RARE BUDDING YEAST SEEN Performed at [facility], [address] [place], [place] [address]    Culture   Final    FEW METHICILLIN RESISTANT STAPHYLOCOCCUS AUREUS FEW CANDIDA ALBICANS    Report Status 11/08/2023 FINAL  Final   Organism ID, Bacteria METHICILLIN RESISTANT STAPHYLOCOCCUS AUREUS  Final      Susceptibility   Methicillin resistant staphylococcus aureus - MIC*    CIPROFLOXACIN  >=8 RESISTANT Resistant     ERYTHROMYCIN >=8 RESISTANT Resistant     GENTAMICIN  <=0.5 SENSITIVE Sensitive     OXACILLIN >=4 RESISTANT Resistant     TETRACYCLINE <=1  SENSITIVE Sensitive     VANCOMYCIN  1 SENSITIVE Sensitive     TRIMETH/SULFA >=320 RESISTANT Resistant     CLINDAMYCIN  <=0.25 SENSITIVE Sensitive     RIFAMPIN <=0.5 SENSITIVE Sensitive     Inducible Clindamycin  NEGATIVE Sensitive     LINEZOLID  2 SENSITIVE Sensitive     * FEW METHICILLIN RESISTANT STAPHYLOCOCCUS AUREUS  Blood Culture (routine x 2)     Status: None   Collection Time: 11/04/23  1:41 PM   Specimen: BLOOD  Result Value Ref Range Status   Specimen Description BLOOD BLOOD RIGHT HAND  Final   Special Requests   Final    BOTTLES DRAWN AEROBIC AND ANAEROBIC Blood Culture results may not be optimal due to an inadequate volume of blood received in culture bottles   Culture   Final    NO GROWTH 5 DAYS Performed at [facility], [address] [place], [place] [address]    Report Status 11/09/2023 FINAL  Final  Blood Culture (routine x 2)     Status: None   Collection Time: 11/04/23  1:42 PM   Specimen: BLOOD  Result Value Ref Range Status   Specimen Description BLOOD LEFT HAND  Final   Special Requests   Final    BOTTLES DRAWN AEROBIC ONLY Blood Culture results may not be optimal due to an inadequate volume of blood received in culture bottles   Culture   Final    NO GROWTH 5 DAYS Performed at [facility], [address] [place], [place] [address]    Report Status 11/09/2023 FINAL  Final  MRSA Next Gen by PCR, Nasal     Status: Abnormal   Collection Time: 11/04/23  2:34 PM   Specimen: Nasal Mucosa; Nasal Swab  Result Value Ref Range Status   MRSA by PCR Next Gen DETECTED (A) NOT DETECTED Final    Comment: RESULT CALLED TO, READ BACK BY AND VERIFIED WITH: JAMIE BAILEY @ 1823 ON 11/04/23 C VARNER (NOTE) The GeneXpert MRSA Assay (FDA approved for NASAL specimens only), is one component of a comprehensive MRSA colonization surveillance program. It is not intended to diagnose MRSA infection nor to guide or monitor treatment for MRSA infections. Test performance is not FDA  approved in patients less than 21 years old. Performed at [facility], [address] [place], [place] [address]  Radiology Studies: No results found.   Scheduled Meds:  acyclovir   400 mg Oral BID   atenolol   25 mg Oral Daily   atorvastatin   20 mg Oral Daily   buPROPion   300 mg Oral Daily   busPIRone   5 mg Oral QHS   Chlorhexidine  Gluconate Cloth  6 each Topical Daily   [START ON 11/10/2023] furosemide   20 mg Oral Daily   guaiFENesin -dextromethorphan   10 mL Oral Q8H   heparin   5,000 Units Subcutaneous Q8H   insulin  aspart  0-20 Units Subcutaneous TID WC   insulin  aspart  0-5 Units Subcutaneous QHS   insulin  aspart  5 Units Subcutaneous TID WC   insulin  glargine  15 Units Subcutaneous Daily   ipratropium  0.5 mg Nebulization Q6H   levalbuterol   0.63 mg Nebulization Q6H   levothyroxine   25 mcg Oral QAC breakfast   linezolid   600 mg Oral Q12H   melatonin  6 mg Oral QHS   methylPREDNISolone  sodium succinate  20 mg Intravenous Daily   montelukast   10 mg Oral QHS   mupirocin  ointment   Nasal BID   pantoprazole   40 mg Oral Daily   polyethylene glycol  17 g Oral BID   pregabalin   300 mg Oral BID   QUEtiapine   150 mg Oral QHS   roflumilast   500 mcg Oral Daily   senna-docusate  1 tablet Oral BID   sodium chloride  flush  3 mL Intravenous Q12H   sodium chloride  flush  3 mL Intravenous Q12H   tamsulosin   0.4 mg Oral Daily   venlafaxine  XR  150 mg Oral Q breakfast   Continuous Infusions:   LOS: 5 days   Time spent: 55 mins  Kadrian Partch Vicci, MD How to contact the [facility] Attending or Consulting provider 7A - 7P or covering provider during after hours 7P -7A, for this patient?  Check the care team in [facility] and look for a) attending/consulting TRH provider listed and b) the TRH team listed Log into [email or web address] to find provider on call.  Locate the TRH provider you are looking for under [facility] and page to a number that you can be directly reached. If you still have  difficulty reaching the provider, please page the [facility] (Director on Call) for the Hospitalists listed on amion for assistance.  11/09/2023, 1:19 PM

## 2023-11-10 DIAGNOSIS — E039 Hypothyroidism, unspecified: Secondary | ICD-10-CM | POA: Diagnosis not present

## 2023-11-10 DIAGNOSIS — J9601 Acute respiratory failure with hypoxia: Secondary | ICD-10-CM

## 2023-11-10 DIAGNOSIS — J9612 Chronic respiratory failure with hypercapnia: Secondary | ICD-10-CM

## 2023-11-10 DIAGNOSIS — F319 Bipolar disorder, unspecified: Secondary | ICD-10-CM | POA: Diagnosis not present

## 2023-11-10 DIAGNOSIS — E1140 Type 2 diabetes mellitus with diabetic neuropathy, unspecified: Secondary | ICD-10-CM | POA: Diagnosis not present

## 2023-11-10 DIAGNOSIS — J189 Pneumonia, unspecified organism: Principal | ICD-10-CM

## 2023-11-10 LAB — GLUCOSE, CAPILLARY
Glucose-Capillary: 139 mg/dL — ABNORMAL HIGH (ref 70–99)
Glucose-Capillary: 116 mg/dL — ABNORMAL HIGH (ref 70–99)
Glucose-Capillary: 199 mg/dL — ABNORMAL HIGH (ref 70–99)

## 2023-11-10 NOTE — Discharge Instructions (Signed)
 Symptom Management Per Hospice Protocol

## 2023-11-10 NOTE — Discharge Summary (Addendum)
 Physician Discharge Summary  Alyiah Ulloa FMW:[id number] DOB: Jul 06, 1960 DOA: 11/04/2023  PCP: Rollene Almarie LABOR, MD  Admit date: 11/04/2023 Discharge date: 11/10/2023   Disposition:  RESIDENTIAL HOSPICE ([facility])  Recommendations for Outpatient Follow-up:  SYMPTOM MANAGEMENT PER HOSPICE PROTOCOLS  Home Health:  HOSPICE   Discharge Condition: HOSPICE   CODE STATUS: DNR DIET:  REGULAR    Brief Hospitalization Summary: Please see all hospital notes, images, labs for full details of the hospitalization. ADMISSION PROVIDER HPI:  Joan [name] is a 63 year old female on home hospice with end-stage terminal COPD, chronic interstitial lung disease, chronic hypoxic respiratory failure-on O2 demand~5 L, CAD, h/o tobacco abuse, chest pains, HTN, anxiety, bipolar disorder, hypothyroidism, DM2, neuropathy, GERD, HLD, obesity, OSA, on CPAP, polypharmacy, PTSD, multiple hospitalization...SABRASABRA Presenting once again with shortness of breath and hypoxia.  Per EMS was complaining of shortness of breath and distress on 4 L at home was satting in 70s, on BiPAP improved to 94% after breathing treatments.   ED Evaluation: Blood pressure (!) 158/95, pulse (!) 137, temperature 98.2 F (36.8 C), resp. rate (!) 26, height 5' 6 (1.676 m), weight 95.7 kg, SpO2 99% on BiPAP.  VBG: pH 7.32/pCO2 78/pO2 34/bicarb 40.7 WBC 24.5 Respiratory panel RSV, influenza A/B's, COVID All negative  CXR : 1. Worsened bibasilar opacities, which is favored to represent interstitial edema. Superimposed infection not excluded. 2. Possible trace pleural effusions.   Requested patient to be admitted for acute on chronic respiratory failure, possible pneumonia  Hospital Course by listed problems addressed  Acute hypoxic on chronic hypercapnic respiratory failure  -Patient will be admitted for acute on chronic respiratory failure to stepdown unit -Will continue BiPAP QHS -Maintaining O2 sat 88-92% -Continue  with IV steroids, DuoNeb bronchodilators scheduled and as needed -Currently patient is [facility] on home hospice --pt is DNR and plan is for residential hospice placement at [facility] when bed is available.  --focus of care transitioning to comfort and dignity with goal to control pain and dyspnea symptoms as able    Chronic interstitial lung disease - Chronic interstitial lung disease with COPD and chronic respiratory failure with obstructive sleep apnea on CPAP nightly and 4-5 L continuous O2 at home --focus of care transitioned to comfort and dignity with goal to control pain and dyspnea symptoms as able    Enrolled in home hospice care - appreciate palliative care team consultation - pt is transferring to [facility] for symptom management    Bipolar disorder -  on Valium , BuSpar , Wellbutrin , Effexor     Pneumonia - Treated  -Presumed pneumonia on chest x-ray, with a COPD exacerbation -completed levoflox/linezolid  for 5 days   Coronary artery disease - Continue statins beta-blockers, asa   Goals of care, counseling/discussion DNR DNI    PAD (peripheral artery disease)  - Continue statin, aspirin    Controlled type 2 diabetes mellitus with neuropathy - Steroid induced hyperglycemia - completed steroids 9/15  -  on high-dose steroids, checked CBG q. ACHS with SSI coverage - added semglee  daily and novolog  TID with meals eaten>50% -- in hospital only CBG (last 3)  Recent Labs (last 2 labs)       Recent Labs    11/09/23 0437 11/09/23 0714 11/09/23 1122  GLUCAP 177* 230* 184*      Acquired hypothyroidism Continue Synthroid    Depression History of depression/anxiety/bipolar - Polypharmacy review meds-try to modify current meds Which includes: Seroquel , Valium , BuSpar , Effexor ,   PTSD (post-traumatic stress disorder) Polypharmacy continue as needed Valium   Allergic rhinitis As needed to help histamine, continue Singulair    Cervical spondylosis without  myelopathy As needed analgesics   Carotid artery disease - Will continue atenolol , statins,   OSA on CPAP Treated with nightly BiPAP   GERD (gastroesophageal reflux disease) - Continue PPI   Mixed hyperlipidemia Continue statins   Iron deficiency anemia due to dietary causes - Not on iron supplements, will monitor H&H   Chronic constipation - resume bowel regimen   Essential hypertension - Continue atenolol , as needed hydralazine    Anxiety - Continue Valium     Discharge Diagnoses:  Principal Problem:   Acute hypoxic on chronic hypercapnic respiratory failure (HCC) Active Problems:   Enrolled in home hospice care   Chronic interstitial lung disease (HCC)   Anxiety   Essential hypertension   Chronic constipation   Iron deficiency anemia due to dietary causes   Mixed hyperlipidemia   GERD (gastroesophageal reflux disease)   OSA on CPAP   Carotid artery disease (HCC)   Cervical spondylosis without myelopathy   Allergic rhinitis   PTSD (post-traumatic stress disorder)   Depression   Acquired hypothyroidism   Controlled type 2 diabetes mellitus with neuropathy (HCC)   PAD (peripheral artery disease) (HCC)   Goals of care, counseling/discussion   Coronary artery disease   Pneumonia   Bipolar disorder   DMII (diabetes mellitus, type 2) (HCC)   Discharge Instructions:  Allergies as of 11/10/2023       Reactions   Keflex [cephalexin] Rash, Other (See Comments)   Has tolerated amoxicillin  and ceftriaxone  since had keflex   Shellfish Allergy Shortness Of Breath, Nausea And Vomiting, Other (See Comments)   Stomach cramps   Tetracyclines & Related Anaphylaxis, Swelling, Other (See Comments)   Throat swelling requiring hospitalization   Clindamycin /lincomycin Other (See Comments)   Burning sensation in her mouth down her throat    Dilaudid  [hydromorphone  Hcl] Other (See Comments)   Lethargy   Prednisone  Other (See Comments)   counter reacts, has tolerated IV  medrol    Incruse Ellipta  [umeclidinium Bromide ] Nausea Only   Molds & Smuts Itching   Allergic to everything, pt said she is allergic to most environmental allergens   Tuberculin Tests Other (See Comments)   False postive   Umeclidinium Other (See Comments)   GI Intolerance        Medication List     STOP taking these medications    atorvastatin  20 MG tablet Commonly known as: LIPITOR   fluticasone  50 MCG/ACT nasal spray Commonly known as: FLONASE    methylPREDNISolone  4 MG tablet Commonly known as: MEDROL    mupirocin  ointment 2 % Commonly known as: BACTROBAN        TAKE these medications    acyclovir  400 MG tablet Commonly known as: ZOVIRAX  Take 400 mg by mouth 2 (two) times daily.   albuterol  (2.5 MG/3ML) 0.083% nebulizer solution Commonly known as: PROVENTIL  Take 2.5 mg by nebulization every 6 (six) hours as needed for shortness of breath.   atenolol  25 MG tablet Commonly known as: TENORMIN  TAKE 1 TABLET BY MOUTH DAILY   buPROPion  300 MG 24 hr tablet Commonly known as: WELLBUTRIN  XL TAKE 1 TABLET BY MOUTH DAILY   busPIRone  5 MG tablet Commonly known as: BUSPAR  Take 1 tablet (5 mg total) by mouth daily. What changed: when to take this   diazepam  5 MG tablet Commonly known as: VALIUM  Take 1 tablet (5 mg total) by mouth every 6 (six) hours as needed for anxiety.   furosemide  20 MG  tablet Commonly known as: LASIX  Take 20 mg by mouth daily as needed for edema.   guaiFENesin -dextromethorphan  100-10 MG/5ML syrup Commonly known as: ROBITUSSIN DM Take 10 mLs by mouth every 8 (eight) hours.   ipratropium-albuterol  0.5-2.5 (3) MG/3ML Soln Commonly known as: DUONEB Take 3 mLs by nebulization every 4 (four) hours as needed (shortness of breath).   levothyroxine  25 MCG tablet Commonly known as: SYNTHROID  TAKE 1 TABLET BY MOUTH DAILY  BEFORE BREAKFAST   melatonin 5 MG Tabs Take 5 mg by mouth at bedtime.   metFORMIN  500 MG tablet Commonly known as:  GLUCOPHAGE  Take 500 mg by mouth daily with breakfast.   montelukast  10 MG tablet Commonly known as: SINGULAIR  Take 10 mg by mouth at bedtime.   nystatin -triamcinolone  ointment Commonly known as: MYCOLOG Apply 1 Application topically 2 (two) times daily as needed (rash).   oxyCODONE -acetaminophen  5-325 MG tablet Commonly known as: PERCOCET/ROXICET Take 1-2 tablets by mouth every 6 (six) hours as needed for severe pain (pain score 7-10).   OXYGEN  Inhale 3.5 L into the lungs continuous. Inhale 3.5 L/min of oxygen  into the lungs w/CPAP at bedtime and as needed for shortness of breath with strenuous activity during the day   pantoprazole  40 MG tablet Commonly known as: PROTONIX  TAKE 1 TABLET BY MOUTH DAILY   Pataday  0.2 % Soln Generic drug: Olopatadine  HCl Apply 1 drop to eye daily.   polyethylene glycol 17 g packet Commonly known as: MIRALAX  / GLYCOLAX  Take 17 g by mouth daily as needed for mild constipation or moderate constipation.   pregabalin  300 MG capsule Commonly known as: LYRICA  Take 1 capsule (300 mg total) by mouth 2 (two) times daily. Take 2 tablets (600 mg) daily   protective barrier Crea Apply 1 Application topically as needed (when changing diaper).   QUEtiapine  Fumarate 150 MG Tabs TAKE 1 TABLET BY MOUTH AT  BEDTIME What changed: how much to take   roflumilast  500 MCG Tabs tablet Commonly known as: DALIRESP  Take 1 tablet (500 mcg total) by mouth daily.   Salonpas 3.03-02-08 % Ptch Generic drug: Camphor-Menthol -Methyl Sal Apply 1 patch topically daily as needed (pain).   Senna S 8.6-50 MG tablet Generic drug: senna-docusate Take 1 tablet by mouth 3 (three) times daily as needed for mild constipation.   tamsulosin  0.4 MG Caps capsule Commonly known as: Flomax  Take 1 capsule (0.4 mg total) by mouth daily.   venlafaxine  XR 150 MG 24 hr capsule Commonly known as: EFFEXOR -XR TAKE 1 CAPSULE BY MOUTH DAILY  WITH BREAKFAST        Allergies  Allergen  Reactions   Keflex [Cephalexin] Rash and Other (See Comments)    Has tolerated amoxicillin  and ceftriaxone  since had keflex   Shellfish Allergy Shortness Of Breath, Nausea And Vomiting and Other (See Comments)    Stomach cramps   Tetracyclines & Related Anaphylaxis, Swelling and Other (See Comments)    Throat swelling requiring hospitalization   Clindamycin /Lincomycin Other (See Comments)    Burning sensation in her mouth down her throat    Dilaudid  [Hydromorphone  Hcl] Other (See Comments)    Lethargy   Prednisone  Other (See Comments)    counter reacts, has tolerated IV medrol    Incruse Ellipta  [Umeclidinium Bromide ] Nausea Only   Molds & Smuts Itching    Allergic to everything, pt said she is allergic to most environmental allergens   Tuberculin Tests Other (See Comments)    False postive   Umeclidinium Other (See Comments)    GI Intolerance  Allergies as of 11/10/2023       Reactions   Keflex [cephalexin] Rash, Other (See Comments)   Has tolerated amoxicillin  and ceftriaxone  since had keflex   Shellfish Allergy Shortness Of Breath, Nausea And Vomiting, Other (See Comments)   Stomach cramps   Tetracyclines & Related Anaphylaxis, Swelling, Other (See Comments)   Throat swelling requiring hospitalization   Clindamycin /lincomycin Other (See Comments)   Burning sensation in her mouth down her throat    Dilaudid  [hydromorphone  Hcl] Other (See Comments)   Lethargy   Prednisone  Other (See Comments)   counter reacts, has tolerated IV medrol    Incruse Ellipta  [umeclidinium Bromide ] Nausea Only   Molds & Smuts Itching   Allergic to everything, pt said she is allergic to most environmental allergens   Tuberculin Tests Other (See Comments)   False postive   Umeclidinium Other (See Comments)   GI Intolerance        Medication List     STOP taking these medications    atorvastatin  20 MG tablet Commonly known as: LIPITOR   fluticasone  50 MCG/ACT nasal  spray Commonly known as: FLONASE    methylPREDNISolone  4 MG tablet Commonly known as: MEDROL    mupirocin  ointment 2 % Commonly known as: BACTROBAN        TAKE these medications    acyclovir  400 MG tablet Commonly known as: ZOVIRAX  Take 400 mg by mouth 2 (two) times daily.   albuterol  (2.5 MG/3ML) 0.083% nebulizer solution Commonly known as: PROVENTIL  Take 2.5 mg by nebulization every 6 (six) hours as needed for shortness of breath.   atenolol  25 MG tablet Commonly known as: TENORMIN  TAKE 1 TABLET BY MOUTH DAILY   buPROPion  300 MG 24 hr tablet Commonly known as: WELLBUTRIN  XL TAKE 1 TABLET BY MOUTH DAILY   busPIRone  5 MG tablet Commonly known as: BUSPAR  Take 1 tablet (5 mg total) by mouth daily. What changed: when to take this   diazepam  5 MG tablet Commonly known as: VALIUM  Take 1 tablet (5 mg total) by mouth every 6 (six) hours as needed for anxiety.   furosemide  20 MG tablet Commonly known as: LASIX  Take 20 mg by mouth daily as needed for edema.   guaiFENesin -dextromethorphan  100-10 MG/5ML syrup Commonly known as: ROBITUSSIN DM Take 10 mLs by mouth every 8 (eight) hours.   ipratropium-albuterol  0.5-2.5 (3) MG/3ML Soln Commonly known as: DUONEB Take 3 mLs by nebulization every 4 (four) hours as needed (shortness of breath).   levothyroxine  25 MCG tablet Commonly known as: SYNTHROID  TAKE 1 TABLET BY MOUTH DAILY  BEFORE BREAKFAST   melatonin 5 MG Tabs Take 5 mg by mouth at bedtime.   metFORMIN  500 MG tablet Commonly known as: GLUCOPHAGE  Take 500 mg by mouth daily with breakfast.   montelukast  10 MG tablet Commonly known as: SINGULAIR  Take 10 mg by mouth at bedtime.   nystatin -triamcinolone  ointment Commonly known as: MYCOLOG Apply 1 Application topically 2 (two) times daily as needed (rash).   oxyCODONE -acetaminophen  5-325 MG tablet Commonly known as: PERCOCET/ROXICET Take 1-2 tablets by mouth every 6 (six) hours as needed for severe pain (pain  score 7-10).   OXYGEN  Inhale 3.5 L into the lungs continuous. Inhale 3.5 L/min of oxygen  into the lungs w/CPAP at bedtime and as needed for shortness of breath with strenuous activity during the day   pantoprazole  40 MG tablet Commonly known as: PROTONIX  TAKE 1 TABLET BY MOUTH DAILY   Pataday  0.2 % Soln Generic drug: Olopatadine  HCl Apply 1 drop to eye daily.  polyethylene glycol 17 g packet Commonly known as: MIRALAX  / GLYCOLAX  Take 17 g by mouth daily as needed for mild constipation or moderate constipation.   pregabalin  300 MG capsule Commonly known as: LYRICA  Take 1 capsule (300 mg total) by mouth 2 (two) times daily. Take 2 tablets (600 mg) daily   protective barrier Crea Apply 1 Application topically as needed (when changing diaper).   QUEtiapine  Fumarate 150 MG Tabs TAKE 1 TABLET BY MOUTH AT  BEDTIME What changed: how much to take   roflumilast  500 MCG Tabs tablet Commonly known as: DALIRESP  Take 1 tablet (500 mcg total) by mouth daily.   Salonpas 3.03-02-08 % Ptch Generic drug: Camphor-Menthol -Methyl Sal Apply 1 patch topically daily as needed (pain).   Senna S 8.6-50 MG tablet Generic drug: senna-docusate Take 1 tablet by mouth 3 (three) times daily as needed for mild constipation.   tamsulosin  0.4 MG Caps capsule Commonly known as: Flomax  Take 1 capsule (0.4 mg total) by mouth daily.   venlafaxine  XR 150 MG 24 hr capsule Commonly known as: EFFEXOR -XR TAKE 1 CAPSULE BY MOUTH DAILY  WITH BREAKFAST        Procedures/Studies: DG Chest Port 1 View Result Date: 11/04/2023 EXAM: 1 VIEW XRAY OF THE CHEST 11/04/2023 12:13:00 PM COMPARISON: 10/23/2023 CLINICAL HISTORY: Cough, shortness of breath. Per triage: Patient brought in by EMS from home complaining of respiratory distress, on 4L O2 at home, sats 70% upon EMS arrival, up to 94% on CPAP; patient given 3 albuterol  and 2 duonebs en route; decreased lower lung sounds, rhonchi and wheezing noted by EMS, ST on  monitor, BP 180/70 en route; history of COPD, EDP and RT at bedside upon arrival. FINDINGS: LUNGS AND PLEURA: Worsened bibasilar interstitial opacities. Possible trace pleural effusions. No pneumothorax. HEART AND MEDIASTINUM: No acute abnormality of the cardiac and mediastinal silhouettes. BONES AND SOFT TISSUES: No acute osseous abnormality. IMPRESSION: 1. Worsened bibasilar opacities, which is favored to represent interstitial edema. Superimposed infection not excluded. 2. Possible trace pleural effusions. Electronically signed by: Waddell Calk MD 11/04/2023 12:56 PM EDT RP Workstation: [id number]   DG Chest Portable 1 View Result Date: 10/23/2023 CLINICAL DATA:  Shortness of breath. EXAM: PORTABLE CHEST 1 VIEW COMPARISON:  August 15, 2023 FINDINGS: The heart size and mediastinal contours are within normal limits. There is marked severity calcification of the aortic arch. Advanced emphysematous lung disease is noted. There is mild prominence of the pulmonary vasculature. Mildly increased interstitial lung markings are also seen. Mild atelectatic changes are seen within the left lung base. No pleural effusion or pneumothorax is identified. Multilevel degenerative changes are noted throughout the thoracic spine. IMPRESSION: 1. Emphysematous lung disease with mild left basilar atelectasis. 2. Mild pulmonary vascular congestion. Electronically Signed   By: Suzen Dials M.D.   On: 10/23/2023 22:28     Subjective:   Discharge Exam: Vitals:   11/10/23 0724 11/10/23 0938  BP:  (!) 143/91  Pulse:    Resp:  (!) 24  Temp:  98.6 F (37 C)  SpO2: 97% 93%   Vitals:   11/10/23 0316 11/10/23 0500 11/10/23 0724 11/10/23 0938  BP: (!) 105/50   (!) 143/91  Pulse: 73     Resp: 20   (!) 24  Temp: 97.8 F (36.6 C)   98.6 F (37 C)  TempSrc: Oral   Axillary  SpO2: 97%  97% 93%  Weight:  103 kg    Height:       General exam: Appears chronically  ill, calm and NAD.  Respiratory system: improved  insp/exp wheezing and rales heard bilateral. Cardiovascular system: normal S1 & S2 heard. No JVD, murmurs, rubs, gallops or clicks. No pedal edema. Gastrointestinal system: Abdomen is nondistended, soft and nontender. No organomegaly or masses felt. Normal bowel sounds heard. Central nervous system: Alert and oriented. No focal neurological deficits. Extremities: Symmetric 5 x 5 power. Skin: No rashes, lesions or ulcers. Psychiatry: Judgement and insight appear normal. Mood & affect appropriate.   The results of significant diagnostics from this hospitalization (including imaging, microbiology, ancillary and laboratory) are listed below for reference.     Microbiology: Recent Results (from the past 240 hours)  Resp panel by RT-PCR (RSV, Flu A&B, Covid) Anterior Nasal Swab     Status: None   Collection Time: 11/04/23 11:45 AM   Specimen: Anterior Nasal Swab  Result Value Ref Range Status   SARS Coronavirus 2 by RT PCR NEGATIVE NEGATIVE Final    Comment: (NOTE) SARS-CoV-2 target nucleic acids are NOT DETECTED.  The SARS-CoV-2 RNA is generally detectable in upper respiratory specimens during the acute phase of infection. The lowest concentration of SARS-CoV-2 viral copies this assay can detect is 138 copies/mL. A negative result does not preclude SARS-Cov-2 infection and should not be used as the sole basis for treatment or other patient management decisions. A negative result may occur with  improper specimen collection/handling, submission of specimen other than nasopharyngeal swab, presence of viral mutation(s) within the areas targeted by this assay, and inadequate number of viral copies(<138 copies/mL). A negative result must be combined with clinical observations, patient history, and epidemiological information. The expected result is Negative.  Fact Sheet for Patients:  BloggerCourse.com  Fact Sheet for Healthcare Providers:   SeriousBroker.it  This test is no t yet approved or cleared by the [place]  FDA and  has been authorized for detection and/or diagnosis of SARS-CoV-2 by FDA under an Emergency Use Authorization (EUA). This EUA will remain  in effect (meaning this test can be used) for the duration of the COVID-19 declaration under Section 564(b)(1) of the Act, 21 U.S.C.section 360bbb-3(b)(1), unless the authorization is terminated  or revoked sooner.       Influenza A by PCR NEGATIVE NEGATIVE Final   Influenza B by PCR NEGATIVE NEGATIVE Final    Comment: (NOTE) The Xpert Xpress SARS-CoV-2/FLU/RSV plus assay is intended as an aid in the diagnosis of influenza from Nasopharyngeal swab specimens and should not be used as a sole basis for treatment. Nasal washings and aspirates are unacceptable for Xpert Xpress SARS-CoV-2/FLU/RSV testing.  Fact Sheet for Patients: BloggerCourse.com  Fact Sheet for Healthcare Providers: SeriousBroker.it  This test is not yet approved or cleared by the [place]  FDA and has been authorized for detection and/or diagnosis of SARS-CoV-2 by FDA under an Emergency Use Authorization (EUA). This EUA will remain in effect (meaning this test can be used) for the duration of the COVID-19 declaration under Section 564(b)(1) of the Act, 21 U.S.C. section 360bbb-3(b)(1), unless the authorization is terminated or revoked.     Resp Syncytial Virus by PCR NEGATIVE NEGATIVE Final    Comment: (NOTE) Fact Sheet for Patients: BloggerCourse.com  Fact Sheet for Healthcare Providers: SeriousBroker.it  This test is not yet approved or cleared by the [place]  FDA and has been authorized for detection and/or diagnosis of SARS-CoV-2 by FDA under an Emergency Use Authorization (EUA). This EUA will remain in effect (meaning this test can be used) for  the duration of the  COVID-19 declaration under Section 564(b)(1) of the Act, 21 U.S.C. section 360bbb-3(b)(1), unless the authorization is terminated or revoked.  Performed at [facility], [address] [place], [place] [address]   Expectorated Sputum Assessment w Gram Stain, Rflx to Resp Cult     Status: None   Collection Time: 11/04/23  1:18 PM   Specimen: Sputum  Result Value Ref Range Status   Specimen Description SPUTUM  Final   Special Requests NONE  Final   Sputum evaluation   Final    THIS SPECIMEN IS ACCEPTABLE FOR SPUTUM CULTURE Performed at [facility], [address], [place], [place] [address]    Report Status 11/04/2023 FINAL  Final  Culture, Respiratory w Gram Stain     Status: None   Collection Time: 11/04/23  1:18 PM   Specimen: SPU  Result Value Ref Range Status   Specimen Description   Final    SPUTUM Performed at [facility], [address], [place], [place] [address]    Special Requests   Final    NONE Reflexed from [phone] [id number] Performed at [facility], [address] [place], [place] [address]    Gram Stain   Final    RARE WBC PRESENT, PREDOMINANTLY PMN FEW GRAM POSITIVE COCCI RARE BUDDING YEAST SEEN Performed at [facility], [address], [place], [place] [address]    Culture   Final    FEW METHICILLIN RESISTANT STAPHYLOCOCCUS AUREUS FEW CANDIDA ALBICANS    Report Status 11/08/2023 FINAL  Final   Organism ID, Bacteria METHICILLIN RESISTANT STAPHYLOCOCCUS AUREUS  Final      Susceptibility   Methicillin resistant staphylococcus aureus - MIC*    CIPROFLOXACIN  >=8 RESISTANT Resistant     ERYTHROMYCIN >=8 RESISTANT Resistant     GENTAMICIN  <=0.5 SENSITIVE Sensitive     OXACILLIN >=4 RESISTANT Resistant     TETRACYCLINE <=1 SENSITIVE Sensitive     VANCOMYCIN  1 SENSITIVE Sensitive     TRIMETH/SULFA >=320 RESISTANT Resistant     CLINDAMYCIN  <=0.25 SENSITIVE Sensitive     RIFAMPIN <=0.5 SENSITIVE Sensitive     Inducible Clindamycin  NEGATIVE  Sensitive     LINEZOLID  2 SENSITIVE Sensitive     * FEW METHICILLIN RESISTANT STAPHYLOCOCCUS AUREUS  Blood Culture (routine x 2)     Status: None   Collection Time: 11/04/23  1:41 PM   Specimen: BLOOD  Result Value Ref Range Status   Specimen Description BLOOD BLOOD RIGHT HAND  Final   Special Requests   Final    BOTTLES DRAWN AEROBIC AND ANAEROBIC Blood Culture results may not be optimal due to an inadequate volume of blood received in culture bottles   Culture   Final    NO GROWTH 5 DAYS Performed at [facility], [address], [place], [place] [address]    Report Status 11/09/2023 FINAL  Final  Blood Culture (routine x 2)     Status: None   Collection Time: 11/04/23  1:42 PM   Specimen: BLOOD  Result Value Ref Range Status   Specimen Description BLOOD LEFT HAND  Final   Special Requests   Final    BOTTLES DRAWN AEROBIC ONLY Blood Culture results may not be optimal due to an inadequate volume of blood received in culture bottles   Culture   Final    NO GROWTH 5 DAYS Performed at [facility], [address] [address], [place], [place] [address]    Report Status 11/09/2023 FINAL  Final  MRSA Next Gen by PCR, Nasal  Status: Abnormal   Collection Time: 11/04/23  2:34 PM   Specimen: Nasal Mucosa; Nasal Swab  Result Value Ref Range Status   MRSA by PCR Next Gen DETECTED (A) NOT DETECTED Final    Comment: RESULT CALLED TO, READ BACK BY AND VERIFIED WITH: JAMIE BAILEY @ 1823 ON 11/04/23 C VARNER (NOTE) The GeneXpert MRSA Assay (FDA approved for NASAL specimens only), is one component of a comprehensive MRSA colonization surveillance program. It is not intended to diagnose MRSA infection nor to guide or monitor treatment for MRSA infections. Test performance is not FDA approved in patients less than 99 years old. Performed at [facility], [address] [place], [place] [address]      Labs: BNP (last 3 results) Recent Labs    01/29/23 1421 08/14/23 1007 11/04/23 1150  BNP 128.0*  237.0* 94.0   Basic Metabolic Panel: Recent Labs  Lab 11/04/23 1150 11/04/23 1342 11/05/23 0338 11/06/23 0405 11/07/23 0556 11/08/23 0512  NA 145  --  138 139 140 142  K 3.7  --  3.9 3.9 3.7 3.1*  CL 99  --  99 100 101 100  CO2 31  --  29 29 30 31   GLUCOSE 146*  --  239* 236* 341* 135*  BUN 15  --  18 24* 26* 30*  CREATININE 0.93  --  0.93 0.89 0.95 1.30*  CALCIUM  9.4  --  8.1* 8.4* 8.8* 8.7*  MG  --  1.6*  --   --   --   --   PHOS  --  2.9  --   --   --   --    Liver Function Tests: No results for input(s): AST, ALT, ALKPHOS, BILITOT, PROT, ALBUMIN in the last 168 hours. No results for input(s): LIPASE, AMYLASE in the last 168 hours. No results for input(s): AMMONIA in the last 168 hours. CBC: Recent Labs  Lab 11/04/23 1150 11/04/23 1342 11/05/23 0338 11/06/23 0405 11/07/23 0556 11/08/23 0512  WBC 24.5* 21.6* 27.8* 21.7* 15.4* 16.5*  NEUTROABS 22.1*  --   --   --   --   --   HGB 14.4 14.2 10.5* 9.8* 10.9* 10.0*  HCT 48.3* 46.9* 34.7* 32.2* 36.5 33.5*  MCV 93.8 92.7 91.3 90.4 90.8 92.0  PLT 315 260 229 201 243 231   Cardiac Enzymes: No results for input(s): CKTOTAL, CKMB, CKMBINDEX, TROPONINI in the last 168 hours. BNP: Invalid input(s): POCBNP CBG: Recent Labs  Lab 11/09/23 1610 11/09/23 2153 11/10/23 0323 11/10/23 0724 11/10/23 1119  GLUCAP 190* 212* 139* 116* 199*   D-Dimer No results for input(s): DDIMER in the last 72 hours. Hgb A1c No results for input(s): HGBA1C in the last 72 hours. Lipid Profile No results for input(s): CHOL, HDL, LDLCALC, TRIG, CHOLHDL, LDLDIRECT in the last 72 hours. Thyroid  function studies No results for input(s): TSH, T4TOTAL, T3FREE, THYROIDAB in the last 72 hours.  Invalid input(s): FREET3 Anemia work up No results for input(s): VITAMINB12, FOLATE, FERRITIN, TIBC, IRON, RETICCTPCT in the last 72 hours. Urinalysis    Component Value Date/Time    COLORURINE YELLOW 01/29/2023 1810   APPEARANCEUR CLEAR 01/29/2023 1810   LABSPEC >1.046 (H) 01/29/2023 1810   PHURINE 7.0 01/29/2023 1810   GLUCOSEU NEGATIVE 01/29/2023 1810   GLUCOSEU NEGATIVE 08/05/2017 1652   HGBUR SMALL (A) 01/29/2023 1810   BILIRUBINUR NEGATIVE 01/29/2023 1810   KETONESUR NEGATIVE 01/29/2023 1810   PROTEINUR 30 (A) 01/29/2023 1810   UROBILINOGEN 0.2 08/05/2017 1652  NITRITE NEGATIVE 01/29/2023 1810   LEUKOCYTESUR NEGATIVE 01/29/2023 1810   Sepsis Labs Recent Labs  Lab 11/05/23 0338 11/06/23 0405 11/07/23 0556 11/08/23 0512  WBC 27.8* 21.7* 15.4* 16.5*   Microbiology Recent Results (from the past 240 hours)  Resp panel by RT-PCR (RSV, Flu A&B, Covid) Anterior Nasal Swab     Status: None   Collection Time: 11/04/23 11:45 AM   Specimen: Anterior Nasal Swab  Result Value Ref Range Status   SARS Coronavirus 2 by RT PCR NEGATIVE NEGATIVE Final    Comment: (NOTE) SARS-CoV-2 target nucleic acids are NOT DETECTED.  The SARS-CoV-2 RNA is generally detectable in upper respiratory specimens during the acute phase of infection. The lowest concentration of SARS-CoV-2 viral copies this assay can detect is 138 copies/mL. A negative result does not preclude SARS-Cov-2 infection and should not be used as the sole basis for treatment or other patient management decisions. A negative result may occur with  improper specimen collection/handling, submission of specimen other than nasopharyngeal swab, presence of viral mutation(s) within the areas targeted by this assay, and inadequate number of viral copies(<138 copies/mL). A negative result must be combined with clinical observations, patient history, and epidemiological information. The expected result is Negative.  Fact Sheet for Patients:  BloggerCourse.com  Fact Sheet for Healthcare Providers:  SeriousBroker.it  This test is no t yet approved or cleared by the  [place]  FDA and  has been authorized for detection and/or diagnosis of SARS-CoV-2 by FDA under an Emergency Use Authorization (EUA). This EUA will remain  in effect (meaning this test can be used) for the duration of the COVID-19 declaration under Section 564(b)(1) of the Act, 21 U.S.C.section 360bbb-3(b)(1), unless the authorization is terminated  or revoked sooner.       Influenza A by PCR NEGATIVE NEGATIVE Final   Influenza B by PCR NEGATIVE NEGATIVE Final    Comment: (NOTE) The Xpert Xpress SARS-CoV-2/FLU/RSV plus assay is intended as an aid in the diagnosis of influenza from Nasopharyngeal swab specimens and should not be used as a sole basis for treatment. Nasal washings and aspirates are unacceptable for Xpert Xpress SARS-CoV-2/FLU/RSV testing.  Fact Sheet for Patients: BloggerCourse.com  Fact Sheet for Healthcare Providers: SeriousBroker.it  This test is not yet approved or cleared by the [place]  FDA and has been authorized for detection and/or diagnosis of SARS-CoV-2 by FDA under an Emergency Use Authorization (EUA). This EUA will remain in effect (meaning this test can be used) for the duration of the COVID-19 declaration under Section 564(b)(1) of the Act, 21 U.S.C. section 360bbb-3(b)(1), unless the authorization is terminated or revoked.     Resp Syncytial Virus by PCR NEGATIVE NEGATIVE Final    Comment: (NOTE) Fact Sheet for Patients: BloggerCourse.com  Fact Sheet for Healthcare Providers: SeriousBroker.it  This test is not yet approved or cleared by the [place]  FDA and has been authorized for detection and/or diagnosis of SARS-CoV-2 by FDA under an Emergency Use Authorization (EUA). This EUA will remain in effect (meaning this test can be used) for the duration of the COVID-19 declaration under Section 564(b)(1) of the Act, 21 U.S.C. section  360bbb-3(b)(1), unless the authorization is terminated or revoked.  Performed at [facility], [address], [place], [place] [address]   Expectorated Sputum Assessment w Gram Stain, Rflx to Resp Cult     Status: None   Collection Time: 11/04/23  1:18 PM   Specimen: Sputum  Result Value Ref Range Status   Specimen Description SPUTUM  Final   Special Requests NONE  Final   Sputum evaluation   Final    THIS SPECIMEN IS ACCEPTABLE FOR SPUTUM CULTURE Performed at [facility], [address] [place], [place] [address]    Report Status 11/04/2023 FINAL  Final  Culture, Respiratory w Gram Stain     Status: None   Collection Time: 11/04/23  1:18 PM   Specimen: SPU  Result Value Ref Range Status   Specimen Description   Final    SPUTUM Performed at [facility], [address], [place], [place] [address]    Special Requests   Final    NONE Reflexed from [phone] Performed at [facility], [address], [place], [place] [address]    Gram Stain   Final    RARE WBC PRESENT, PREDOMINANTLY PMN FEW GRAM POSITIVE COCCI RARE BUDDING YEAST SEEN Performed at [facility], [address], [place], [place] [address]    Culture   Final    FEW METHICILLIN RESISTANT STAPHYLOCOCCUS AUREUS FEW CANDIDA ALBICANS    Report Status 11/08/2023 FINAL  Final   Organism ID, Bacteria METHICILLIN RESISTANT STAPHYLOCOCCUS AUREUS  Final      Susceptibility   Methicillin resistant staphylococcus aureus - MIC*    CIPROFLOXACIN  >=8 RESISTANT Resistant     ERYTHROMYCIN >=8 RESISTANT Resistant     GENTAMICIN  <=0.5 SENSITIVE Sensitive     OXACILLIN >=4 RESISTANT Resistant     TETRACYCLINE <=1 SENSITIVE Sensitive     VANCOMYCIN  1 SENSITIVE Sensitive     TRIMETH/SULFA >=320 RESISTANT Resistant     CLINDAMYCIN  <=0.25 SENSITIVE Sensitive     RIFAMPIN <=0.5 SENSITIVE Sensitive     Inducible Clindamycin  NEGATIVE Sensitive     LINEZOLID  2 SENSITIVE Sensitive     * FEW METHICILLIN RESISTANT STAPHYLOCOCCUS  AUREUS  Blood Culture (routine x 2)     Status: None   Collection Time: 11/04/23  1:41 PM   Specimen: BLOOD  Result Value Ref Range Status   Specimen Description BLOOD BLOOD RIGHT HAND  Final   Special Requests   Final    BOTTLES DRAWN AEROBIC AND ANAEROBIC Blood Culture results may not be optimal due to an inadequate volume of blood received in culture bottles   Culture   Final    NO GROWTH 5 DAYS Performed at [facility], [address], [place], [place] [address]    Report Status 11/09/2023 FINAL  Final  Blood Culture (routine x 2)     Status: None   Collection Time: 11/04/23  1:42 PM   Specimen: BLOOD  Result Value Ref Range Status   Specimen Description BLOOD LEFT HAND  Final   Special Requests   Final    BOTTLES DRAWN AEROBIC ONLY Blood Culture results may not be optimal due to an inadequate volume of blood received in culture bottles   Culture   Final    NO GROWTH 5 DAYS Performed at [facility], [address], [place], [place] [address]    Report Status 11/09/2023 FINAL  Final  MRSA Next Gen by PCR, Nasal     Status: Abnormal   Collection Time: 11/04/23  2:34 PM   Specimen: Nasal Mucosa; Nasal Swab  Result Value Ref Range Status   MRSA by PCR Next Gen DETECTED (A) NOT DETECTED Final    Comment: RESULT CALLED TO, READ BACK BY AND VERIFIED WITH: JAMIE BAILEY @ 1823 ON 11/04/23 C VARNER (NOTE) The GeneXpert MRSA Assay (FDA approved for NASAL specimens only), is one  component of a comprehensive MRSA colonization surveillance program. It is not intended to diagnose MRSA infection nor to guide or monitor treatment for MRSA infections. Test performance is not FDA approved in patients less than 21 years old. Performed at [facility], [address] [place], [place] [address]     Time coordinating discharge: 40 mins  SIGNED:  Afton Louder, MD  [facility] 11/10/2023, 11:49 AM How to contact the [facility] Attending or Consulting provider 7A - 7P or covering provider  during after hours 7P -7A, for this patient?  Check the care team in [facility] and look for a) attending/consulting TR[facility] provider listed and b) the TRH team listed Log into [email or web address] and use [facility]'s universal password to access. If you do not have the password, please contact the hospital operator. Locate the TR[facility] provider you are looking for under [facility] and page to a number that you can be directly reached. If you still have difficulty reaching the provider, please page the [facility] (Director on Call) for the Hospitalists listed on amion for assistance.

## 2023-11-10 NOTE — TOC Transition Note (Signed)
 Transition of Care [facility]) - Discharge Note   Patient Details  Name: Joan [name] MRN: [id number] Date of Birth: May 31, 1960  [facility] CM/SW Contact:  Sharlyne Stabs, RN Phone Number: 11/10/2023, 1:00 PM   Clinical Narrative:    [facility] nurse here to assess. They have a bed at [facility]. Family consent. RN calling report, TOC scheduled EMS. DC summary sent in the hub.     Final next level of care: Hospice Medical Facility Barriers to Discharge: Barriers Resolved   Patient Goals and CMS Choice Patient states their goals for this hospitalization and ongoing recovery are:: agreeable to Hospice CMS Medicare.gov Compare Post Acute Care list provided to:: Patient Choice offered to / list presented to : Patient      Discharge Placement                Patient to be transferred to facility by: EMS   Patient and family notified of of transfer: 11/10/23  Discharge Plan and Services Additional resources added to the After Visit Summary for       Social Drivers of Health (SDOH) Interventions SDOH Screenings   Food Insecurity: No Food Insecurity (11/04/2023)  Housing: Low Risk  (11/07/2023)  Transportation Needs: No Transportation Needs (11/04/2023)  Utilities: Not At Risk (11/04/2023)  Alcohol Screen: Low Risk  (09/24/2023)  Depression (PHQ2-9): Medium Risk (09/24/2023)  Financial Resource Strain: Low Risk  (09/24/2023)  Recent Concern: Financial Resource Strain - Medium Risk (07/28/2023)  Physical Activity: Inactive (09/24/2023)  Social Connections: Socially Isolated (11/04/2023)  Stress: Stress Concern Present (09/24/2023)  Tobacco Use: Medium Risk (11/04/2023)  Health Literacy: Adequate Health Literacy (09/24/2023)     Readmission Risk Interventions    01/31/2023    2:52 PM 09/13/2022   10:53 AM  Readmission Risk Prevention Plan  Transportation Screening Complete Complete  HRI or Home Care Consult  Complete  Social Work Consult for Recovery Care  Planning/Counseling  Complete  Palliative Care Screening  Not Applicable  Medication Review Oceanographer) Complete Complete  HRI or Home Care Consult Complete   SW Recovery Care/Counseling Consult Complete   Palliative Care Screening Complete   Skilled Nursing Facility Not Applicable

## 2023-11-10 NOTE — Plan of Care (Signed)
 Problem: Education: Goal: Knowledge of General Education information will improve Description: Including pain rating scale, medication(s)/side effects and non-pharmacologic comfort measures Outcome: Progressing   Problem: Health Behavior/Discharge Planning: Goal: Ability to manage health-related needs will improve Outcome: Progressing   Problem: Clinical Measurements: Goal: Ability to maintain clinical measurements within normal limits will improve Outcome: Progressing Goal: Will remain free from infection Outcome: Progressing Goal: Diagnostic test results will improve Outcome: Progressing Goal: Respiratory complications will improve Outcome: Progressing Goal: Cardiovascular complication will be avoided Outcome: Progressing   Problem: Activity: Goal: Risk for activity intolerance will decrease Outcome: Progressing   Problem: Nutrition: Goal: Adequate nutrition will be maintained Outcome: Progressing   Problem: Coping: Goal: Level of anxiety will decrease Outcome: Progressing   Problem: Elimination: Goal: Will not experience complications related to bowel motility Outcome: Progressing Goal: Will not experience complications related to urinary retention Outcome: Progressing   Problem: Pain Managment: Goal: General experience of comfort will improve and/or be controlled Outcome: Progressing   Problem: Safety: Goal: Ability to remain free from injury will improve Outcome: Progressing   Problem: Skin Integrity: Goal: Risk for impaired skin integrity will decrease Outcome: Progressing   Problem: Education: Goal: Ability to describe self-care measures that may prevent or decrease complications (Diabetes Survival Skills Education) will improve Outcome: Progressing Goal: Individualized Educational Video(s) Outcome: Progressing   Problem: Coping: Goal: Ability to adjust to condition or change in health will improve Outcome: Progressing   Problem: Fluid  Volume: Goal: Ability to maintain a balanced intake and output will improve Outcome: Progressing   Problem: Health Behavior/Discharge Planning: Goal: Ability to identify and utilize available resources and services will improve Outcome: Progressing Goal: Ability to manage health-related needs will improve Outcome: Progressing   Problem: Metabolic: Goal: Ability to maintain appropriate glucose levels will improve Outcome: Progressing   Problem: Nutritional: Goal: Maintenance of adequate nutrition will improve Outcome: Progressing Goal: Progress toward achieving an optimal weight will improve Outcome: Progressing   Problem: Skin Integrity: Goal: Risk for impaired skin integrity will decrease Outcome: Progressing   Problem: Tissue Perfusion: Goal: Adequacy of tissue perfusion will improve Outcome: Progressing   Problem: Education: Goal: Knowledge of disease or condition will improve Outcome: Progressing Goal: Knowledge of the prescribed therapeutic regimen will improve Outcome: Progressing Goal: Individualized Educational Video(s) Outcome: Progressing   Problem: Activity: Goal: Ability to tolerate increased activity will improve Outcome: Progressing Goal: Will verbalize the importance of balancing activity with adequate rest periods Outcome: Progressing   Problem: Respiratory: Goal: Ability to maintain a clear airway will improve Outcome: Progressing Goal: Levels of oxygenation will improve Outcome: Progressing Goal: Ability to maintain adequate ventilation will improve Outcome: Progressing   Problem: Education: Goal: Knowledge of disease or condition will improve Outcome: Progressing Goal: Knowledge of the prescribed therapeutic regimen will improve Outcome: Progressing Goal: Individualized Educational Video(s) Outcome: Progressing   Problem: Education: Goal: Ability to describe self-care measures that may prevent or decrease complications (Diabetes Survival  Skills Education) will improve Outcome: Progressing Goal: Individualized Educational Video(s) Outcome: Progressing   Problem: Coping: Goal: Ability to adjust to condition or change in health will improve Outcome: Progressing   Problem: Fluid Volume: Goal: Ability to maintain a balanced intake and output will improve Outcome: Progressing   Problem: Health Behavior/Discharge Planning: Goal: Ability to identify and utilize available resources and services will improve Outcome: Progressing Goal: Ability to manage health-related needs will improve Outcome: Progressing   Problem: Metabolic: Goal: Ability to maintain appropriate glucose levels will improve Outcome: Progressing  Problem: Nutritional: Goal: Maintenance of adequate nutrition will improve Outcome: Progressing Goal: Progress toward achieving an optimal weight will improve Outcome: Progressing   Problem: Skin Integrity: Goal: Risk for impaired skin integrity will decrease Outcome: Progressing   Problem: Tissue Perfusion: Goal: Adequacy of tissue perfusion will improve Outcome: Progressing

## 2023-11-15 ENCOUNTER — Other Ambulatory Visit: Payer: Self-pay | Admitting: Internal Medicine

## 2023-11-15 ENCOUNTER — Other Ambulatory Visit: Payer: Self-pay | Admitting: Pulmonary Disease

## 2023-11-20 ENCOUNTER — Encounter (HOSPITAL_BASED_OUTPATIENT_CLINIC_OR_DEPARTMENT_OTHER): Payer: Self-pay | Admitting: Pulmonary Disease

## 2023-11-20 ENCOUNTER — Ambulatory Visit (HOSPITAL_BASED_OUTPATIENT_CLINIC_OR_DEPARTMENT_OTHER): Admitting: Pulmonary Disease

## 2023-11-26 DEATH — deceased

## 2023-12-01 ENCOUNTER — Other Ambulatory Visit (HOSPITAL_COMMUNITY): Payer: Self-pay | Admitting: Psychiatry

## 2023-12-01 ENCOUNTER — Other Ambulatory Visit: Payer: Self-pay | Admitting: Pulmonary Disease

## 2023-12-03 NOTE — Telephone Encounter (Signed)
 error

## 2023-12-16 ENCOUNTER — Ambulatory Visit (HOSPITAL_COMMUNITY): Admitting: Psychiatry

## 2024-07-28 ENCOUNTER — Ambulatory Visit

## 2024-10-07 ENCOUNTER — Ambulatory Visit
